# Patient Record
Sex: Female | Born: 1935 | Race: White | Hispanic: No | Marital: Married | Smoking: Former smoker
Health system: Southern US, Community
[De-identification: ages and names within clinical notes are randomized; demographics above are authoritative.]

## PROBLEM LIST (undated history)

## (undated) DIAGNOSIS — I5023 Acute on chronic systolic (congestive) heart failure: Secondary | ICD-10-CM

## (undated) DIAGNOSIS — Z9889 Other specified postprocedural states: Secondary | ICD-10-CM

## (undated) DIAGNOSIS — Z951 Presence of aortocoronary bypass graft: Secondary | ICD-10-CM

## (undated) DIAGNOSIS — Z860100 Personal history of colon polyps, unspecified: Secondary | ICD-10-CM

## (undated) DIAGNOSIS — I442 Atrioventricular block, complete: Secondary | ICD-10-CM

## (undated) DIAGNOSIS — Z952 Presence of prosthetic heart valve: Secondary | ICD-10-CM

## (undated) DIAGNOSIS — M1990 Unspecified osteoarthritis, unspecified site: Secondary | ICD-10-CM

## (undated) DIAGNOSIS — H8109 Meniere's disease, unspecified ear: Secondary | ICD-10-CM

## (undated) DIAGNOSIS — I2510 Atherosclerotic heart disease of native coronary artery without angina pectoris: Secondary | ICD-10-CM

## (undated) DIAGNOSIS — K219 Gastro-esophageal reflux disease without esophagitis: Secondary | ICD-10-CM

## (undated) DIAGNOSIS — E039 Hypothyroidism, unspecified: Secondary | ICD-10-CM

## (undated) DIAGNOSIS — T148XXA Other injury of unspecified body region, initial encounter: Secondary | ICD-10-CM

## (undated) DIAGNOSIS — Z9981 Dependence on supplemental oxygen: Secondary | ICD-10-CM

## (undated) DIAGNOSIS — I10 Essential (primary) hypertension: Secondary | ICD-10-CM

## (undated) DIAGNOSIS — R112 Nausea with vomiting, unspecified: Secondary | ICD-10-CM

## (undated) DIAGNOSIS — J189 Pneumonia, unspecified organism: Secondary | ICD-10-CM

## (undated) DIAGNOSIS — Z950 Presence of cardiac pacemaker: Secondary | ICD-10-CM

## (undated) DIAGNOSIS — F419 Anxiety disorder, unspecified: Secondary | ICD-10-CM

## (undated) DIAGNOSIS — J449 Chronic obstructive pulmonary disease, unspecified: Secondary | ICD-10-CM

## (undated) DIAGNOSIS — I9789 Other postprocedural complications and disorders of the circulatory system, not elsewhere classified: Secondary | ICD-10-CM

## (undated) DIAGNOSIS — I480 Paroxysmal atrial fibrillation: Secondary | ICD-10-CM

## (undated) DIAGNOSIS — Z923 Personal history of irradiation: Secondary | ICD-10-CM

## (undated) DIAGNOSIS — I272 Pulmonary hypertension, unspecified: Secondary | ICD-10-CM

## (undated) DIAGNOSIS — Z8679 Personal history of other diseases of the circulatory system: Secondary | ICD-10-CM

## (undated) DIAGNOSIS — I5042 Chronic combined systolic (congestive) and diastolic (congestive) heart failure: Secondary | ICD-10-CM

## (undated) DIAGNOSIS — L089 Local infection of the skin and subcutaneous tissue, unspecified: Secondary | ICD-10-CM

## (undated) DIAGNOSIS — E785 Hyperlipidemia, unspecified: Secondary | ICD-10-CM

## (undated) DIAGNOSIS — T8189XA Other complications of procedures, not elsewhere classified, initial encounter: Secondary | ICD-10-CM

## (undated) HISTORY — PX: VAGINAL HYSTERECTOMY: SUR661

## (undated) HISTORY — DX: Local infection of the skin and subcutaneous tissue, unspecified: L08.9

## (undated) HISTORY — DX: Hyperlipidemia, unspecified: E78.5

## (undated) HISTORY — DX: Chronic obstructive pulmonary disease, unspecified: J44.9

## (undated) HISTORY — DX: Paroxysmal atrial fibrillation: I48.0

## (undated) HISTORY — DX: Essential (primary) hypertension: I10

## (undated) HISTORY — PX: DILATION AND CURETTAGE OF UTERUS: SHX78

## (undated) HISTORY — DX: Hypothyroidism, unspecified: E03.9

## (undated) HISTORY — DX: Presence of prosthetic heart valve: Z95.2

## (undated) HISTORY — PX: EYE SURGERY: SHX253

## (undated) HISTORY — PX: INSERT / REPLACE / REMOVE PACEMAKER: SUR710

## (undated) HISTORY — PX: CORONARY ANGIOPLASTY: SHX604

## (undated) HISTORY — DX: Pulmonary hypertension, unspecified: I27.20

## (undated) HISTORY — PX: APPENDECTOMY: SHX54

## (undated) HISTORY — DX: Acute on chronic systolic (congestive) heart failure: I50.23

## (undated) HISTORY — PX: CARDIAC VALVE REPLACEMENT: SHX585

## (undated) HISTORY — DX: Chronic combined systolic (congestive) and diastolic (congestive) heart failure: I50.42

## (undated) HISTORY — DX: Other injury of unspecified body region, initial encounter: T14.8XXA

## (undated) HISTORY — PX: TONSILLECTOMY: SUR1361

## (undated) HISTORY — DX: Meniere's disease, unspecified ear: H81.09

## (undated) HISTORY — PX: COCHLEAR IMPLANT: SUR684

## (undated) HISTORY — DX: Atherosclerotic heart disease of native coronary artery without angina pectoris: I25.10

## (undated) SURGERY — Surgical Case
Anesthesia: *Unknown

---

## 2000-09-09 DIAGNOSIS — C50919 Malignant neoplasm of unspecified site of unspecified female breast: Secondary | ICD-10-CM

## 2000-09-09 HISTORY — PX: MASTECTOMY, PARTIAL: SHX709

## 2000-09-09 HISTORY — DX: Malignant neoplasm of unspecified site of unspecified female breast: C50.919

## 2004-10-08 ENCOUNTER — Ambulatory Visit: Payer: Self-pay | Admitting: Internal Medicine

## 2004-10-19 ENCOUNTER — Ambulatory Visit: Payer: Self-pay | Admitting: Oncology

## 2005-10-31 ENCOUNTER — Ambulatory Visit: Payer: Self-pay | Admitting: Internal Medicine

## 2005-11-13 ENCOUNTER — Ambulatory Visit: Payer: Self-pay | Admitting: Oncology

## 2006-02-04 ENCOUNTER — Ambulatory Visit: Payer: Self-pay | Admitting: Gastroenterology

## 2006-11-06 ENCOUNTER — Ambulatory Visit: Payer: Self-pay | Admitting: Internal Medicine

## 2006-11-07 ENCOUNTER — Ambulatory Visit: Payer: Self-pay | Admitting: Internal Medicine

## 2006-11-17 ENCOUNTER — Ambulatory Visit: Payer: Self-pay | Admitting: Surgery

## 2006-11-17 ENCOUNTER — Other Ambulatory Visit: Payer: Self-pay

## 2006-11-25 ENCOUNTER — Ambulatory Visit: Payer: Self-pay | Admitting: Surgery

## 2006-11-25 HISTORY — PX: BREAST BIOPSY: SHX20

## 2006-12-19 ENCOUNTER — Ambulatory Visit: Payer: Self-pay | Admitting: Obstetrics and Gynecology

## 2007-01-02 ENCOUNTER — Ambulatory Visit: Payer: Self-pay | Admitting: Obstetrics and Gynecology

## 2007-11-30 ENCOUNTER — Ambulatory Visit: Payer: Self-pay | Admitting: Family Medicine

## 2008-02-08 ENCOUNTER — Ambulatory Visit: Payer: Self-pay | Admitting: Gastroenterology

## 2008-03-23 ENCOUNTER — Ambulatory Visit: Payer: Self-pay

## 2008-06-14 ENCOUNTER — Ambulatory Visit: Payer: Self-pay | Admitting: Internal Medicine

## 2008-11-30 ENCOUNTER — Ambulatory Visit: Payer: Self-pay | Admitting: Internal Medicine

## 2009-12-04 ENCOUNTER — Ambulatory Visit: Payer: Self-pay | Admitting: Internal Medicine

## 2010-07-17 ENCOUNTER — Ambulatory Visit: Payer: Self-pay | Admitting: Gastroenterology

## 2010-07-20 LAB — PATHOLOGY REPORT

## 2010-12-11 ENCOUNTER — Ambulatory Visit: Payer: Self-pay | Admitting: Internal Medicine

## 2010-12-18 ENCOUNTER — Ambulatory Visit: Payer: Self-pay | Admitting: Specialist

## 2012-01-08 ENCOUNTER — Ambulatory Visit: Payer: Self-pay | Admitting: Internal Medicine

## 2012-01-09 ENCOUNTER — Ambulatory Visit: Payer: Self-pay | Admitting: Internal Medicine

## 2012-01-20 ENCOUNTER — Ambulatory Visit: Payer: Self-pay | Admitting: Surgery

## 2012-01-20 HISTORY — PX: BREAST BIOPSY: SHX20

## 2012-01-21 LAB — PATHOLOGY REPORT

## 2012-07-28 ENCOUNTER — Ambulatory Visit: Payer: Self-pay | Admitting: Surgery

## 2012-09-09 HISTORY — PX: CATARACT EXTRACTION W/ INTRAOCULAR LENS  IMPLANT, BILATERAL: SHX1307

## 2012-12-02 ENCOUNTER — Ambulatory Visit: Payer: Self-pay | Admitting: Ophthalmology

## 2013-01-06 ENCOUNTER — Ambulatory Visit: Payer: Self-pay | Admitting: Ophthalmology

## 2013-01-14 ENCOUNTER — Ambulatory Visit: Payer: Self-pay | Admitting: Internal Medicine

## 2013-09-20 ENCOUNTER — Ambulatory Visit: Payer: Self-pay | Admitting: Gastroenterology

## 2013-09-22 LAB — PATHOLOGY REPORT

## 2013-11-07 HISTORY — PX: CARDIAC CATHETERIZATION: SHX172

## 2013-11-16 ENCOUNTER — Ambulatory Visit: Payer: Self-pay | Admitting: Cardiology

## 2014-01-26 ENCOUNTER — Ambulatory Visit: Payer: Self-pay | Admitting: Internal Medicine

## 2014-03-28 DIAGNOSIS — J449 Chronic obstructive pulmonary disease, unspecified: Secondary | ICD-10-CM | POA: Insufficient documentation

## 2014-05-08 DIAGNOSIS — E039 Hypothyroidism, unspecified: Secondary | ICD-10-CM | POA: Insufficient documentation

## 2014-05-08 DIAGNOSIS — Z860101 Personal history of adenomatous and serrated colon polyps: Secondary | ICD-10-CM | POA: Insufficient documentation

## 2014-05-08 DIAGNOSIS — E119 Type 2 diabetes mellitus without complications: Secondary | ICD-10-CM | POA: Insufficient documentation

## 2014-05-08 DIAGNOSIS — H81319 Aural vertigo, unspecified ear: Secondary | ICD-10-CM | POA: Insufficient documentation

## 2014-05-08 DIAGNOSIS — Z86010 Personal history of colonic polyps: Secondary | ICD-10-CM | POA: Insufficient documentation

## 2014-05-08 DIAGNOSIS — E7800 Pure hypercholesterolemia, unspecified: Secondary | ICD-10-CM | POA: Insufficient documentation

## 2014-05-08 DIAGNOSIS — R7303 Prediabetes: Secondary | ICD-10-CM | POA: Insufficient documentation

## 2014-05-08 DIAGNOSIS — I10 Essential (primary) hypertension: Secondary | ICD-10-CM | POA: Insufficient documentation

## 2014-05-10 DIAGNOSIS — K219 Gastro-esophageal reflux disease without esophagitis: Secondary | ICD-10-CM | POA: Insufficient documentation

## 2014-10-10 HISTORY — PX: CARDIAC CATHETERIZATION: SHX172

## 2014-10-27 ENCOUNTER — Inpatient Hospital Stay: Payer: Self-pay | Admitting: Internal Medicine

## 2014-10-27 ENCOUNTER — Other Ambulatory Visit: Payer: Self-pay | Admitting: Physician Assistant

## 2014-10-27 DIAGNOSIS — I313 Pericardial effusion (noninflammatory): Secondary | ICD-10-CM

## 2014-10-27 DIAGNOSIS — I4891 Unspecified atrial fibrillation: Secondary | ICD-10-CM

## 2014-10-27 DIAGNOSIS — I5043 Acute on chronic combined systolic (congestive) and diastolic (congestive) heart failure: Secondary | ICD-10-CM

## 2014-10-28 ENCOUNTER — Encounter: Payer: Self-pay | Admitting: Cardiovascular Disease

## 2014-10-28 DIAGNOSIS — I2510 Atherosclerotic heart disease of native coronary artery without angina pectoris: Secondary | ICD-10-CM

## 2014-11-04 NOTE — Telephone Encounter (Signed)
This encounter was created in error - please disregard.

## 2014-11-09 ENCOUNTER — Encounter: Payer: Self-pay | Admitting: *Deleted

## 2014-11-10 ENCOUNTER — Encounter: Payer: Self-pay | Admitting: Cardiovascular Disease

## 2014-11-10 ENCOUNTER — Encounter: Payer: Self-pay | Admitting: *Deleted

## 2014-11-10 ENCOUNTER — Ambulatory Visit (INDEPENDENT_AMBULATORY_CARE_PROVIDER_SITE_OTHER): Payer: Medicare HMO | Admitting: Cardiovascular Disease

## 2014-11-10 VITALS — BP 122/66 | HR 86 | Ht 61.5 in | Wt 149.5 lb

## 2014-11-10 DIAGNOSIS — E785 Hyperlipidemia, unspecified: Secondary | ICD-10-CM

## 2014-11-10 DIAGNOSIS — I340 Nonrheumatic mitral (valve) insufficiency: Secondary | ICD-10-CM

## 2014-11-10 DIAGNOSIS — I059 Rheumatic mitral valve disease, unspecified: Secondary | ICD-10-CM | POA: Insufficient documentation

## 2014-11-10 DIAGNOSIS — I5022 Chronic systolic (congestive) heart failure: Secondary | ICD-10-CM

## 2014-11-10 DIAGNOSIS — I10 Essential (primary) hypertension: Secondary | ICD-10-CM

## 2014-11-10 DIAGNOSIS — I4891 Unspecified atrial fibrillation: Secondary | ICD-10-CM

## 2014-11-10 MED ORDER — APIXABAN 5 MG PO TABS: 5.0000 mg | ORAL_TABLET | Freq: Two times a day (BID) | ORAL | Status: DC

## 2014-11-10 MED ORDER — LOSARTAN POTASSIUM 50 MG PO TABS: 50.0000 mg | ORAL_TABLET | Freq: Every day | ORAL | Status: DC

## 2014-11-10 MED ORDER — SIMVASTATIN 20 MG PO TABS: 20.0000 mg | ORAL_TABLET | Freq: Every day | ORAL | Status: DC

## 2014-11-10 MED ORDER — POTASSIUM CHLORIDE CRYS ER 20 MEQ PO TBCR: 20.0000 meq | EXTENDED_RELEASE_TABLET | Freq: Every day | ORAL | Status: DC

## 2014-11-10 MED ORDER — DILTIAZEM HCL ER 120 MG PO CP24: 120.0000 mg | ORAL_CAPSULE | Freq: Every day | ORAL | Status: DC

## 2014-11-10 NOTE — Progress Notes (Signed)
Primary care physician: Dr. Ezequiel Kayser  HPI  This is a pleasant 80 year old female who is here today for a follow-up visit after recent hospitalization at [facility] for acute systolic heart failure and atrial fibrillation. She has known history of COPD, breast cancer status post right partial mastectomy, hypothyroidism, hyperlipidemia and Mnire disease. She was hospitalized last month at [facility] for worsening shortness of breath, palpitations and orthopnea. She was noted to be in atrial fibrillation with rapid ventricular response with heart rate in the 130 range. There was evidence of fluid overload with BNP of 2500. She underwent an echocardiogram which showed an ejection fraction of 35-40% with anteroseptal hypokinesis, moderately dilated left atrium, small pericardial effusion and moderate to severe mitral regurgitation. She was treated with rate control and diuresis with subsequent improvement. She underwent a right and left cardiac catheterization which showed moderately elevated filling pressures with giant V waves suggestive of significant mitral regurgitation. There was no evidence of obstructive coronary artery disease. She is feeling significantly better and overall lost 12 pounds since that hospitalization. Dyspnea improved significantly and she is not requiring oxygen as often.   Allergies  Allergen Reactions  . Augmentin [Amoxicillin-Pot Clavulanate]   . Nifedipine     Other reaction(s): Unknown  . Penicillins     Other reaction(s): Other (See Comments) Swollen joints  . Propranolol     Other reaction(s): Unknown  . Ace Inhibitors     Other reaction(s): Cough  . Diazepam     Other reaction(s): Other (See Comments) Made her "hyper"  . Meperidine     Other reaction(s): Other (See Comments) Made her "hyper"  . Propoxyphene     Other reaction(s): Other (See Comments) Made her "hyper"     Current Outpatient Prescriptions on File Prior to Visit  Medication Sig Dispense Refill  .  benzonatate (TESSALON) 100 MG capsule Take 100 mg by mouth 3 (three) times daily as needed for cough.    . bisoprolol (ZEBETA) 5 MG tablet Take 5 mg by mouth daily.    . fluticasone (FLONASE) 50 MCG/ACT nasal spray Place into both nostrils as needed.     . furosemide (LASIX) 20 MG tablet Take 20 mg by mouth 2 (two) times daily.     . Indacaterol Maleate 75 MCG CAPS Place into inhaler and inhale daily.    Marland Kitchen ipratropium-albuterol (DUONEB) 0.5-2.5 (3) MG/3ML SOLN Take 3 mLs by nebulization as needed.     Marland Kitchen levothyroxine (SYNTHROID, LEVOTHROID) 88 MCG tablet Take 88 mcg by mouth daily before breakfast.    . pantoprazole (PROTONIX) 40 MG tablet Take 40 mg by mouth daily.     No current facility-administered medications on file prior to visit.     Past Medical History  Diagnosis Date  . Hypertension   . COPD (chronic obstructive pulmonary disease)   . Hyperlipidemia   . Meniere disease   . HX: breast cancer   . Acute CHF (congestive heart failure)   . Atrial fibrillation   . Mitral regurgitation   . COPD (chronic obstructive pulmonary disease)   . Heart murmur     as child  . Cancer     hx breast cancer  . Hypothyroidism      Past Surgical History  Procedure Laterality Date  . Cochlear implant Bilateral   . Mastectomy, partial Right   . Cardiac catheterization  11/2013    [facility]  . Cardiac catheterization  10/2014    [facility]     Family History  Problem  Relation Age of Onset  . Heart disease Father   . Heart disease Brother   . Heart attack Paternal Uncle      History   Social History  . Marital Status: Married    Spouse Name: N/A  . Number of Children: N/A  . Years of Education: N/A   Occupational History  . Not on file.   Social History Main Topics  . Smoking status: Former Smoker -- 40 years    Types: Cigarettes  . Smokeless tobacco: Not on file  . Alcohol Use: Yes     Comment: OCCASIONAL  . Drug Use: No  . Sexual Activity: Not on file   Other Topics  Concern  . Not on file   Social History Narrative      PHYSICAL EXAM   BP 122/66 mmHg  Pulse 86  Ht 5' 1.5" (1.562 m)  Wt 149 lb 8 oz (67.813 kg)  BMI 27.79 kg/m2 Constitutional: She is oriented to person, place, and time. She appears well-developed and well-nourished. No distress.  HENT: No nasal discharge.  Head: Normocephalic and atraumatic.  Eyes: Pupils are equal and round. No discharge.  Neck: Normal range of motion. Neck supple. No JVD present. No thyromegaly present.  Cardiovascular: Normal rate, irregular rhythm, normal heart sounds. Exam reveals no gallop and no friction rub. No murmur heard.  Pulmonary/Chest: Effort normal and breath sounds normal. No stridor. No respiratory distress. She has no wheezes. She has no rales. She exhibits no tenderness.  Abdominal: Soft. Bowel sounds are normal. She exhibits no distension. There is no tenderness. There is no rebound and no guarding.  Musculoskeletal: Normal range of motion. She exhibits no edema and no tenderness.  Neurological: She is alert and oriented to person, place, and time. Coordination normal.  Skin: Skin is warm and dry. No rash noted. She is not diaphoretic. No erythema. No pallor.  Psychiatric: She has a normal mood and affect. Her behavior is normal. Judgment and thought content normal.     WEX:HBZJIR fibrillation  -Left axis -anterior fascicular block.   ABNORMAL    ASSESSMENT AND PLAN

## 2014-11-10 NOTE — Assessment & Plan Note (Signed)
Ejection fraction was 35-40% likely due to tachycardia-induced cardiomyopathy versus mitral regurgitation. She appears to be euvolemic on current dose of furosemide. Continue treatment with bisoprolol and losartan.

## 2014-11-10 NOTE — Assessment & Plan Note (Signed)
I recommend a transesophageal echocardiogram to further evaluate this. This will be done at the time of cardioversion next week.

## 2014-11-10 NOTE — Assessment & Plan Note (Signed)
The patient continues to be in atrial fibrillation with ventricular rate is well controlled on current medications. Continue anticoagulation with Eliquis. I recommend attempting cardioversion at least once. I discussed risks and benefits and this will be scheduled for next week.

## 2014-11-10 NOTE — Assessment & Plan Note (Signed)
Blood pressure is well controlled on current medications. 

## 2014-11-10 NOTE — Assessment & Plan Note (Signed)
I decreased the dose of simvastatin to 20 mg daily given the interaction with diltiazem.

## 2014-11-10 NOTE — Patient Instructions (Addendum)
Decrease Simvastatin to 20 mg once daily.  Continue other medications.   Your physician has requested that you have a TEE. During a TEE, sound waves are used to create images of your heart. It provides your doctor with information about the size and shape of your heart and how well your heart's chambers and valves are working. In this test, a transducer is attached to the end of a flexible tube that's guided down your throat and into your esophagus (the tube leading from you mouth to your stomach) to get a more detailed image of your heart. You are not awake for the procedure. Please see the instruction sheet given to you today. For further information please visit [email or web address].   Your physician has recommended that you have a Cardioversion (DCCV). Electrical Cardioversion uses a jolt of electricity to your heart either through paddles or wired patches attached to your chest. This is a controlled, usually prescheduled, procedure. Defibrillation is done under light anesthesia in the hospital, and you usually go home the day of the procedure. This is done to get your heart back into a normal rhythm. You are not awake for the procedure. Please see the instruction sheet given to you today.  Your physician recommends that you have labs today:  [name]  BMP  INR

## 2014-11-11 LAB — CBC WITH DIFFERENTIAL/PLATELET
BASOS ABS: 0 10*3/uL (ref 0.0–0.2)
Basos: 0 %
Eos: 3 %
Eosinophils Absolute: 0.2 10*3/uL (ref 0.0–0.4)
HEMATOCRIT: 37.9 % (ref 34.0–46.6)
Hemoglobin: 12.7 g/dL (ref 11.1–15.9)
IMMATURE GRANULOCYTES: 0 %
Immature Grans (Abs): 0 10*3/uL (ref 0.0–0.1)
Lymphocytes Absolute: 0.9 10*3/uL (ref 0.7–3.1)
Lymphs: 12 %
MCH: 32.3 pg (ref 26.6–33.0)
MCHC: 33.5 g/dL (ref 31.5–35.7)
MCV: 96 fL (ref 79–97)
MONOS ABS: 0.8 10*3/uL (ref 0.1–0.9)
Monocytes: 11 %
NEUTROS ABS: 5.3 10*3/uL (ref 1.4–7.0)
Neutrophils Relative %: 74 %
PLATELETS: 199 10*3/uL (ref 150–379)
RBC: 3.93 x10E6/uL (ref 3.77–5.28)
RDW: 14.6 % (ref 12.3–15.4)
WBC: 7.2 10*3/uL (ref 3.4–10.8)

## 2014-11-11 LAB — BASIC METABOLIC PANEL
BUN / CREAT RATIO: 15 (ref 11–26)
BUN: 10 mg/dL (ref 8–27)
CO2: 24 mmol/L (ref 18–29)
CREATININE: 0.68 mg/dL (ref 0.57–1.00)
Calcium: 9.5 mg/dL (ref 8.7–10.3)
Chloride: 102 mmol/L (ref 97–108)
GFR calc non Af Amer: 85 mL/min/{1.73_m2} (ref >59)
GFR, EST AFRICAN AMERICAN: 98 mL/min/{1.73_m2} (ref >59)
Glucose: 147 mg/dL — ABNORMAL HIGH (ref 65–99)
Potassium: 4.7 mmol/L (ref 3.5–5.2)
SODIUM: 144 mmol/L (ref 134–144)

## 2014-11-11 LAB — PROTIME-INR
INR: 1.1 (ref 0.8–1.2)
Prothrombin Time: 11.2 s (ref 9.1–12.0)

## 2014-11-14 ENCOUNTER — Telehealth: Payer: Self-pay

## 2014-11-14 NOTE — Telephone Encounter (Signed)
Pt husband calling asking why no one has called him back.   Stated to patient that we are seeing patient. And sometimes they need to  with the doctor and it may take time before they can call.  He understood and said someone needs to call them soon.

## 2014-11-14 NOTE — Telephone Encounter (Signed)
Pt spouse called, states pt has a procedure on Thurs. And asks if it is ok for her to have this procedure since she has a cochlear implant.please call and advise.

## 2014-11-14 NOTE — Telephone Encounter (Signed)
Patient's husband stated that he spoke with the cochlear implant company today regarding whether it was safe to do the TEE and Cardioversion since patient has a cochlear implant. Patient and her husband just wanted to check with office today to determine if Cardioversion would be "bipolar" or "monopolar", as the company explained to them that a "monopolar" cardioversion was not recommended. Dr. Meda Coffee verbalized that "bipolar" was the method used for Cardioversion. Will route to Dr. Fletcher Anon so that he knows of patient's concern.

## 2014-11-15 NOTE — Telephone Encounter (Signed)
Informed patient her Tee/Cardioversion is scheduled for 11/24/14 at 0730 Patient to arrive at 0630 am  Informed patient per Dr. Fletcher Anon that it is all right to have procedure with cochlear implant  Patient verbalized understanding

## 2014-11-23 ENCOUNTER — Telehealth: Payer: Self-pay | Admitting: *Deleted

## 2014-11-23 NOTE — Telephone Encounter (Signed)
Cath orders faxed  [facility] conformed receipt

## 2014-11-23 NOTE — Telephone Encounter (Signed)
Correction to previous phone note  Faxed tee cardioversion orders not cath  [facility] confirmed receipt

## 2014-11-24 ENCOUNTER — Ambulatory Visit: Payer: Self-pay | Admitting: Cardiovascular Disease

## 2014-11-24 ENCOUNTER — Other Ambulatory Visit: Payer: Self-pay

## 2014-11-24 ENCOUNTER — Encounter: Payer: Self-pay | Admitting: Cardiovascular Disease

## 2014-11-24 DIAGNOSIS — I4891 Unspecified atrial fibrillation: Secondary | ICD-10-CM | POA: Diagnosis not present

## 2014-11-24 DIAGNOSIS — I340 Nonrheumatic mitral (valve) insufficiency: Secondary | ICD-10-CM | POA: Diagnosis not present

## 2014-11-24 MED ORDER — POTASSIUM CHLORIDE CRYS ER 20 MEQ PO TBCR: 20.0000 meq | EXTENDED_RELEASE_TABLET | Freq: Every day | ORAL | Status: DC

## 2014-11-24 MED ORDER — DILTIAZEM HCL ER 120 MG PO CP24: 120.0000 mg | ORAL_CAPSULE | Freq: Every day | ORAL | Status: DC

## 2014-12-12 ENCOUNTER — Encounter: Payer: Self-pay | Admitting: Cardiovascular Disease

## 2014-12-12 ENCOUNTER — Ambulatory Visit (INDEPENDENT_AMBULATORY_CARE_PROVIDER_SITE_OTHER): Payer: Medicare PPO | Admitting: Cardiovascular Disease

## 2014-12-12 VITALS — BP 122/70 | HR 55 | Ht 61.5 in | Wt 150.2 lb

## 2014-12-12 DIAGNOSIS — I5021 Acute systolic (congestive) heart failure: Secondary | ICD-10-CM | POA: Diagnosis not present

## 2014-12-12 DIAGNOSIS — I4891 Unspecified atrial fibrillation: Secondary | ICD-10-CM

## 2014-12-12 DIAGNOSIS — I5022 Chronic systolic (congestive) heart failure: Secondary | ICD-10-CM | POA: Diagnosis not present

## 2014-12-12 DIAGNOSIS — I340 Nonrheumatic mitral (valve) insufficiency: Secondary | ICD-10-CM

## 2014-12-12 MED ORDER — SPIRONOLACTONE 25 MG PO TABS: 25.0000 mg | ORAL_TABLET | Freq: Every day | ORAL | Status: DC

## 2014-12-12 MED ORDER — BISOPROLOL FUMARATE 10 MG PO TABS: 10.0000 mg | ORAL_TABLET | Freq: Every day | ORAL | Status: DC

## 2014-12-12 NOTE — Patient Instructions (Signed)
Your physician has recommended you make the following change in your medication:  1) STOP diltiazem 2) STOP potassium  3) INCREASE bisoprolol to 10 mg one tablet by mouth once daily ( you may take two 5 mg tablets once daily until you use them up) 4) START spironolactone 25 mg one tablet by mouth once daily  Your physician recommends that you return for lab work in: 1 week- BMP  Your physician recommends that you schedule a follow-up appointment in: 1 month with Dr. Fletcher Anon.

## 2014-12-12 NOTE — Assessment & Plan Note (Signed)
She is maintaining in sinus rhythm after recent successful cardioversion. Continue long-term anticoagulation with Eliquis.

## 2014-12-12 NOTE — Progress Notes (Signed)
Primary care physician: Dr. Ezequiel Kayser  HPI  This is a pleasant 80 year old female who is here today for a follow-up visit regarding chronic systolic heart failure, atrial fibrillation and mitral regurgitation.  She has known history of COPD, breast cancer status post right partial mastectomy, hypothyroidism, hyperlipidemia and Mnire disease. She was hospitalized in February at [facility] for worsening shortness of breath, palpitations and orthopnea. She was noted to be in atrial fibrillation with rapid ventricular response with heart rate in the 130 range. There was evidence of fluid overload with BNP of 2500. She underwent an echocardiogram which showed an ejection fraction of 35-40% with anteroseptal hypokinesis, moderately dilated left atrium, small pericardial effusion and moderate to severe mitral regurgitation. She was treated with rate control and diuresis with subsequent improvement. She underwent a right and left cardiac catheterization which showed moderately elevated filling pressures with giant V waves suggestive of significant mitral regurgitation. There was no evidence of obstructive coronary artery disease.  I proceeded with TEE before cardioversion which showed no significant change in ejection fraction and valvular abnormalities. There was moderate tricuspid regurgitation with moderate pulmonary hypertension. The mitral valve appeared degenerative with no significant prolapse. Mitral regurgitation was moderate to severe. Although she is maintaining in sinus rhythm, she is actually reporting worsening dyspnea. No chest pain.   Allergies  Allergen Reactions  . Augmentin [Amoxicillin-Pot Clavulanate]   . Nifedipine     Other reaction(s): Unknown  . Penicillins     Other reaction(s): Other (See Comments) Swollen joints  . Propranolol     Other reaction(s): Unknown  . Ace Inhibitors     Other reaction(s): Cough  . Diazepam     Other reaction(s): Other (See Comments) Made her "hyper"   . Meperidine     Other reaction(s): Other (See Comments) Made her "hyper"  . Propoxyphene     Other reaction(s): Other (See Comments) Made her "hyper"     Current Outpatient Prescriptions on File Prior to Visit  Medication Sig Dispense Refill  . apixaban (ELIQUIS) 5 MG TABS tablet Take 1 tablet (5 mg total) by mouth 2 (two) times daily. 60 tablet 6  . benzonatate (TESSALON) 100 MG capsule Take 100 mg by mouth 3 (three) times daily as needed for cough.    . bisoprolol (ZEBETA) 5 MG tablet Take 5 mg by mouth daily.    Marland Kitchen diltiazem (DILACOR XR) 120 MG 24 hr capsule Take 1 capsule (120 mg total) by mouth daily. 30 capsule 6  . fluticasone (FLONASE) 50 MCG/ACT nasal spray Place into both nostrils as needed.     . furosemide (LASIX) 20 MG tablet Take 20 mg by mouth 2 (two) times daily.     . Indacaterol Maleate 75 MCG CAPS Place into inhaler and inhale daily.    Marland Kitchen ipratropium-albuterol (DUONEB) 0.5-2.5 (3) MG/3ML SOLN Take 3 mLs by nebulization as needed.     Marland Kitchen levothyroxine (SYNTHROID, LEVOTHROID) 88 MCG tablet Take 88 mcg by mouth daily before breakfast.    . losartan (COZAAR) 50 MG tablet Take 1 tablet (50 mg total) by mouth daily. 30 tablet 6  . pantoprazole (PROTONIX) 40 MG tablet Take 40 mg by mouth daily.    . potassium chloride SA (K-DUR,KLOR-CON) 20 MEQ tablet Take 1 tablet (20 mEq total) by mouth daily. 30 tablet 6   No current facility-administered medications on file prior to visit.     Past Medical History  Diagnosis Date  . Hypertension   . COPD (chronic obstructive pulmonary disease)   .  Hyperlipidemia   . Meniere disease   . HX: breast cancer   . Acute CHF (congestive heart failure)   . Atrial fibrillation   . Mitral regurgitation   . COPD (chronic obstructive pulmonary disease)   . Heart murmur     as child  . Cancer     hx breast cancer  . Hypothyroidism      Past Surgical History  Procedure Laterality Date  . Cochlear implant Bilateral   . Mastectomy,  partial Right   . Cardiac catheterization  11/2013    [facility]  . Cardiac catheterization  10/2014    [facility]     Family History  Problem Relation Age of Onset  . Heart disease Father   . Heart disease Brother   . Heart attack Paternal Uncle      History   Social History  . Marital Status: Married    Spouse Name: N/A  . Number of Children: N/A  . Years of Education: N/A   Occupational History  . Not on file.   Social History Main Topics  . Smoking status: Former Smoker -- 40 years    Types: Cigarettes  . Smokeless tobacco: Not on file  . Alcohol Use: Yes     Comment: OCCASIONAL  . Drug Use: No  . Sexual Activity: Not on file   Other Topics Concern  . Not on file   Social History Narrative      PHYSICAL EXAM   BP 122/70 mmHg  Pulse 55  Ht 5' 1.5" (1.562 m)  Wt 150 lb 4 oz (68.153 kg)  BMI 27.93 kg/m2 Constitutional: She is oriented to person, place, and time. She appears well-developed and well-nourished. No distress.  HENT: No nasal discharge.  Head: Normocephalic and atraumatic.  Eyes: Pupils are equal and round. No discharge.  Neck: Normal range of motion. Neck supple. No JVD present. No thyromegaly present.  Cardiovascular: Bradycardic, regular rhythm, normal heart sounds. Exam reveals no gallop and no friction rub. No murmur heard.  Pulmonary/Chest: Effort normal and breath sounds normal. No stridor. No respiratory distress. She has no wheezes. She has no rales. She exhibits no tenderness.  Abdominal: Soft. Bowel sounds are normal. She exhibits no distension. There is no tenderness. There is no rebound and no guarding.  Musculoskeletal: Normal range of motion. She exhibits no edema and no tenderness.  Neurological: She is alert and oriented to person, place, and time. Coordination normal.  Skin: Skin is warm and dry. No rash noted. She is not diaphoretic. No erythema. No pallor.  Psychiatric: She has a normal mood and affect. Her behavior is normal. Judgment  and thought content normal.     EKG: Sinus  Bradycardia  - occasional PAC    # PACs = 1. -Left axis -anterior fascicular block.   ABNORMAL    ASSESSMENT AND PLAN

## 2014-12-12 NOTE — Assessment & Plan Note (Signed)
This was at least moderate to severe most recent TEE. The plan is to repeat echocardiogram in about a month hopefully why she is maintaining sinus rhythm in order to reevaluate this. If there is no significant improvement in her symptoms, mitral regurgitation could be the etiology.

## 2014-12-12 NOTE — Assessment & Plan Note (Signed)
She appears to be euvolemic but is complaining of worsening dyspnea since recent cardioversion although she is maintaining in sinus rhythm. It is possible that she is heart rate dependent and she is mildly bradycardic today. Given that her ejection fraction is low, I stopped diltiazem and increased bisoprolol to 10 mg daily. Continue treatment with losartan. I added spironolactone 25 mg once daily and discontinued potassium. Check basic metabolic profile in one month. I will consider switching losartan to Entersto in the near future.

## 2014-12-19 ENCOUNTER — Other Ambulatory Visit (INDEPENDENT_AMBULATORY_CARE_PROVIDER_SITE_OTHER): Payer: Medicare PPO | Admitting: *Deleted

## 2014-12-19 DIAGNOSIS — I4891 Unspecified atrial fibrillation: Secondary | ICD-10-CM

## 2014-12-19 DIAGNOSIS — I5021 Acute systolic (congestive) heart failure: Secondary | ICD-10-CM

## 2014-12-20 LAB — BASIC METABOLIC PANEL
BUN/Creatinine Ratio: 16 (ref 11–26)
BUN: 12 mg/dL (ref 8–27)
CO2: 27 mmol/L (ref 18–29)
CREATININE: 0.75 mg/dL (ref 0.57–1.00)
Calcium: 9.4 mg/dL (ref 8.7–10.3)
Chloride: 100 mmol/L (ref 97–108)
GFR calc Af Amer: 89 mL/min/{1.73_m2} (ref >59)
GFR, EST NON AFRICAN AMERICAN: 77 mL/min/{1.73_m2} (ref >59)
Glucose: 136 mg/dL — ABNORMAL HIGH (ref 65–99)
Potassium: 4.8 mmol/L (ref 3.5–5.2)
SODIUM: 141 mmol/L (ref 134–144)

## 2015-01-08 NOTE — Discharge Summary (Signed)
PATIENT NAME:  [name], [name] MR#:  [id number] DATE OF BIRTH:  Feb 10, 1935  DATE OF ADMISSION:  10/27/2014 DATE OF DISCHARGE:  10/30/2014  PRESENTING COMPLAINT: Shortness of breath.   DISCHARGE DIAGNOSES: 1.  Acute systolic congestive heart failure.  2.  New-onset atrial fibrillation.  3.  Moderate to severe mitral regurgitation on cardiac catheterization.  4.  Chronic obstructive pulmonary disease flare.  5.  Hypertension.   CONDITION ON DISCHARGE: Fair. Vital stable.   PROCEDURES: Cardiac catheterization done on feb 19th showed no angiographic evidence of occlusive CAD. Calcified coronaries without obstructive disease. Global left ventricular systolic function was moderately depressed. EF 30% to 40%. Mitral valve exhibited moderate to severe regurgitation.   CODE STATUS: FULL.  DISCHARGE MEDICATIONS: 1.  Tessalon Perles 100 mg t.i.d.  2.  Levoxyl 88 mcg p.o. daily.  3.  Protonix 40 mg daily.  4.  Flonase 50 mcg nasal spray daily.  5.  Simvastatin 40 mg daily at bedtime.  6.  Arcapta Neohaler 75 mcg 1 capsule inhalation daily.  7.  Bisoprolol 5 mg daily.  8.  Lasix 20 mg b.i.d.  9.  Prednisone taper.  10.  Eliquis 5 mg b.i.d.  11.  DuoNebs as before.  12.  Diltiazem 120 mg extended release 1 capsule p.o. daily.  13.  Potassium chloride 20 mEq p.o. daily.  14.  Cozaar 50 mg daily.   HOME OXYGEN:  2 liters nasal cannula oxygen.   DISCHARGE INSTRUCTIONS AND FOLLOWUP:  1.  Follow up with Dr. Fletcher Anon in 1 to 2 weeks.  2.  Follow up with Dr. Raul Del in 2 to 4 weeks.  3.  The patient advised to resume her home oxygen use as before.   CONSULTATIONS: Cardiology, Dr. Fletcher Anon.   BRIEF SUMMARY OF HOSPITAL COURSE: [name] is a pleasant 80 year old Caucasian female with history of COPD on home oxygen, obstructive sleep apnea on CPAP, high blood pressure and hypothyroidism admitted with:  1.  Acute systolic congestive heart failure. Echo showed EF of 35% to 40% with apical wall  hypokinesis. She underwent cardiac cath. Results as above were noted. She was also found to have moderate to severe MR. Lungs were clear on exam. She was changed to p.o. Lasix and continued on her beta blockers and losartan.  2.  New-onset A-fib, rate controlled now on Cardizem and bisoprolol. She was started on Eliquis by gastroenterology. Plan is for cardioversion outpatient after 3 weeks of anticoagulation. 3.  COPD. Continued home oxygen. Received a tapering dose of steroids with her COPD flare along with home inhalers.  4.  Hypothyroidism. Continue Synthroid.  5.  Hypertension. Continue home meds.   Hospital stay otherwise remained stable. The patient remained a FULL code.   TIME SPENT: 40 minutes.  ____________________________ Hart Rochester Posey Pronto, MD sap:sb D: 10/31/2014 14:34:26 ET T: 10/31/2014 15:26:26 ET JOB#: [id number]  cc: Georgie Eduardo A. Posey Pronto, MD, <Dictator> Ilda Basset MD ELECTRONICALLY SIGNED 11/08/2014 17:32

## 2015-01-08 NOTE — Consult Note (Signed)
General Aspect Primary Cardiologist: New to [facility] _________________  80 year old female with history of pulmonary HTN, COPD on home O2, impaired hearing status post bilateral cochlear implants, history of breast cancer status post right partial mastectomy, hypothyroidism, hyperlipidemia, and Meniere disease status post bilateral cochlear implants who presented to [facility] ED on 2/18 with 3 day history of increased SOB and 1 day history of sudden onset of palpitations.  _________________  PMH: 1. Pulmonary HTN 2. COPD on home O2 3. Impaired hearing status post bilateral cochlear implants 4. History of breast cancer status post right partial mastectomy 5. Hypothyroidism 6. HLD 7. Meniere disease status post bilateral cochlear implants __________________   Present Illness 80 year old female with the above problem list who presented to [facility] on 2/18 with 3 day history of increased SOB and 1 day history of sudden onset of palpitations. She was previously seen by [facility] Card[facility] x 1 for increased SOB and underwent RHC only (no LHC) in March 2015. RHC showed pressure of 34 mm Hg. She was advised to started sildenafil for pulmonary HTN by her cardiologist. By her pulmonologist she was advised to start a medication that reportedly cost >$2,000 monthly. She decided to start no medication. She has not followed up with cardiology since that time. She has persistent SOB on home O2. She has been trying to lose weight through dieting with her husband. She has been unable to exercise 2/2 SOB and fatigue.   Over the past 3 days her SOB has gotten considerably worse. She also noted a 4 pound weight gain over 2 days with associated lowere extremity edema and abdominal swelling. She has also noted some orthopnea over the past 24 hours to the point of not being able to lay down at all. No chest pain. Her husband reports she had to be feeling quite bad to call EMS. No dizziness. No loss of consciousness. She does note  palpitations. She does admit to some palpitations on and off. No history of any recent fever or cough. No history of any nausea, vomiting, diarrhea, abdominal pain, or dysuria.  Upon her arrival to [facility] she was noted to be in new onset a-fib with RVR with HR in the 130s. She received IV Cardizem x 2 in the Emergency Department, following which her heart rate improved to the mid 90s to low 100 beats per minute. The patient was also noted to have elevated BNP of 2484, and chest x-ray was consistent with bilateral pulmonary congestion. The patient was given supplemental oxygen, and following rate control of the heart, her symptoms started improving. She was started on Lovenox. Echo showed EF 35-40%, severe anteroseptal wall HK, diastolic dysfunction, moderately dilated LA at 4.90 cm, moderately dilated RA, small pericardial effusion, moderate to severe MR, PASP at 47.8 mm Hg. HR ranging in to 1-teens on tele this morning.   Physical Exam:  GEN no acute distress   HEENT hearing intact to voice   NECK supple   RESP normal resp effort  clear BS   CARD Irregular rate and rhythm  Murmur   Murmur Systolic   ABD denies tenderness  soft   EXTR negative edema   SKIN normal to palpation   NEURO cranial nerves intact   PSYCH alert   Review of Systems:  General: Fatigue   Skin: No Complaints   ENT: No Complaints   Eyes: No Complaints   Neck: No Complaints   Respiratory: Short of breath   Cardiovascular: Palpitations  Dyspnea  Orthopnea   Gastrointestinal: Nausea   Genitourinary: No Complaints   Vascular: No Complaints   Musculoskeletal: No Complaints   Neurologic: No Complaints   Hematologic: No Complaints   Endocrine: No Complaints   Psychiatric: No Complaints   Review of Systems: All other systems were reviewed and found to be negative   Medications/Allergies Reviewed Medications/Allergies reviewed   Family & Social History:  Family and Social History:  Family  History Hypertension   Social History negative ETOH, negative Illicit drugs   + Tobacco Prior (greater than 1 year)   Place of Living Home   Home Medications: Medication Instructions Status  felodipine extended release 10 mg oral tablet, extended release 1 tab(s) orally once a day Active  Levoxyl 88 mcg (0.088 mg) oral tablet 1 tab(s) orally once a day Active  Protonix 40 mg oral delayed release tablet 1 tab(s) orally once a day Active  hydrochlorothiazide-losartan 25 mg-100 mg oral tablet 1 tab(s) orally once a day Active  Flonase 50 mcg/inh nasal spray 1 spray(s) nasal once a day Active  simvastatin 40 mg oral tablet 1 tab(s) orally once a day (at bedtime) Active  aspirin 81 mg oral delayed release tablet 1 tab(s) orally once a day Active  Arcapta Neohaler 75 mcg inhalation capsule 1 each inhaled once a day Active  bisoprolol 5 mg oral tablet 1 tab(s) orally once a day Active  furosemide 20 mg oral tablet 1 tab(s) orally once a day Active  benzonatate 100 mg oral capsule 1 cap(s) orally 3 times a day Active   Lab Results:  Routine Chem:  18-Feb-16 12:11   Glucose, Serum  161  BUN  23  Creatinine (comp) 0.85  Sodium, Serum 139  Potassium, Serum 3.6  Chloride, Serum 102  CO2, Serum 29  Calcium (Total), Serum 9.3  Anion Gap 8  Osmolality (calc) 285  eGFR (African American) >60  eGFR (Non-African American) >60 (eGFR values <76m/min/1.73 m2 may be an indication of chronic kidney disease (CKD). Calculated eGFR, using the MRDR Study equation, is useful in  patients with stable renal function. The eGFR calculation will not be reliable in acutely ill patients when serum creatinine is changing rapidly. It is not useful in patients on dialysis. The eGFR calculation may not be applicable to patients at the low and high extremes of body sizes, pregnant women, and vegetarians.)  B-Type Natriuretic Peptide ([facility]  2484 (Result(s) reported on 27 Oct 2014 at 12:50AM.)  Cardiac:   18-Feb-16 04:30   Troponin I < 0.02 (0.00-0.05 0.05 ng/mL or less: NEGATIVE  Repeat testing in 3-6 hrs  if clinically indicated. >0.05 ng/mL: POTENTIAL  MYOCARDIAL INJURY. Repeat  testing in 3-6 hrs if  clinically indicated. NOTE: An increase or decrease  of 30% or more on serial  testing suggests a  clinically important change)    08:07   Troponin I < 0.02 (0.00-0.05 0.05 ng/mL or less: NEGATIVE  Repeat testing in 3-6 hrs  if clinically indicated. >0.05 ng/mL: POTENTIAL  MYOCARDIAL INJURY. Repeat  testing in 3-6 hrs if  clinically indicated. NOTE: An increase or decrease  of 30% or more on serial  testing suggests a  clinically important change)    12:11   Troponin I < 0.02 (0.00-0.05 0.05 ng/mL or less: NEGATIVE  Repeat testing in 3-6 hrs  if clinically indicated. >0.05 ng/mL: POTENTIAL  MYOCARDIAL INJURY. Repeat  testing in 3-6 hrs if  clinically indicated. NOTE: An increase or decrease  of 30% or more on serial  testing suggests a  clinically important change)  Routine Coag:  18-Feb-16 12:11   Prothrombin 15.0 (11.4-15.0 NOTE: New Reference Range  10/07/14)  INR 1.2 (INR reference interval applies to patients on anticoagulant therapy. A single INR therapeutic range for coumarins is not optimal for all indications; however, the suggested range for most indications is 2.0 - 3.0. Exceptions to the INR Reference Range may include: Prosthetic heart valves, acute myocardial infarction, prevention of myocardial infarction, and combinations of aspirin and anticoagulant. The need for a higher or lower target INR must be assessed individually. Reference: The Pharmacology and Management of the Vitamin K  antagonists: the seventh ACCP Conference on Antithrombotic and Thrombolytic Therapy. OEVOJ.5009 Sept:126 (3suppl): [id number]. A HCT value >55% may artifactually increase the PT.  In one study,  the increase was an average of 25%. Reference:  "Effect on Routine and  Special Coagulation Testing Values of Citrate Anticoagulant Adjustment in Patients with High HCT Values." American Journal of Clinical Pathology 2006;126:400-405.)  Routine Hem:  18-Feb-16 12:11   WBC (CBC) 9.0  RBC (CBC)  3.73  Hemoglobin (CBC) 12.2  Hematocrit (CBC) 36.8  Platelet Count (CBC) 181 (Result(s) reported on 27 Oct 2014 at 12:47AM.)  MCV 99  MCH 32.7  MCHC 33.2  RDW  14.8   EKG:  EKG Interp. by me   Interpretation a-fib, 98 bpm, no significant st/t changes   Radiology Results: XRay:    18-Feb-16 00:15, Chest Portable Single View  Chest Portable Single View   REASON FOR EXAM:    SOB  COMMENTS:       PROCEDURE: DXR - DXR PORTABLE CHEST SINGLE VIEW  - Oct 27 2014 12:15AM     CLINICAL DATA:  Increasing dyspnea over the past 3 days.    EXAM:  PORTABLE CHEST - 1 VIEW    COMPARISON:  CT 12/18/2010    FINDINGS:  There is moderate cardiomegaly. Calcified granulomata are present.  Linear basilar opacities are present, atelectasis versus scarring.  There may be small pleural effusions, with slight blunting of the  lateral costophrenic angles of indeterminate chronicity. There is  ground-glass right upper lobe opacity which may represent alveolar  infiltrate or edema. Mild vascular and interstitial prominence is  present.     IMPRESSION:  The vascular and interstitial changes are suggestive of congestive  heart failure. Small pleural effusions are probably present and  there may be early alveolar infiltrate/edema in the right upper  lobe.      Electronically Signed    By: Andreas Newport M.D.    On: 10/27/2014 00:24         Verified By: Andreas Newport, M.D.,  Cardiology:    18-Feb-16 09:20, Echo Doppler  Echo Doppler   REASON FOR EXAM:      COMMENTS:       PROCEDURE: [facility] - ECHO DOPPLER COMPLETE(TRANSTHOR)  - Oct 27 2014  9:20AM     RESULT: Echocardiogram Report    Patient Name:   RAYHANA SLIDER Date of Exam: 10/27/2014  Medical Rec #:   [id number]        Custom1:  Date ofBirth:  05-19-1935      Height:       61.8 in  Patient Age:    84 years      Weight:       156.5 lb  Patient Gender: F             BSA:  1.51 m??    Indications: CHF  Sonographer:    Sherrie Sport RDCS  Referring Phys: Azucena Freed, N    Summary:   1. Left ventricular ejection fraction, by visual estimation, is 35 to   40%.   2. Moderately decreased global left ventricular systolic function.   3. Severe anteroseptal hypokinesis.   4. Mild concentric left ventricular hypertrophy.   5. Restrictive pattern of LV diastolic filling.   6. Moderately dilated left atrium.   7. Moderately dilated right atrium.   8. Small pericardial effusion, as described above.   9. Mild thickening and calcification of the anterior and posterior   mitral valve leaflets.  10. Moderate to severe mitral valve regurgitation.  11. Mild to moderate aortic valve sclerosis/calcification without any   evidence of aortic stenosis.  12. Moderate to severe tricuspid regurgitation.  13. Mildly elevated pulmonary artery systolic pressure.  2D AND M-MODE MEASUREMENTS (normal ranges within parentheses):  Left Ventricle:          Normal  IVSd (2D):      1.20 cm (0.7-1.1)  LVPWd (2D):     1.07 cm (0.7-1.1) Aorta/LA:                  Normal  LVIDd (2D):     5.06 cm (3.4-5.7) Aortic Root (2D): 2.80 cm (2.4-3.7)  LVIDs (2D):     4.40 cm           Left Atrium (2D): 4.90 cm (1.9-4.0)  LV FS (2D):     13.0 %   (>25%)  LV EF (2D):     27.9 %   (>50%)                                    Right Ventricle:             RVd (2D):        5.28 cm  LV DIASTOLIC FUNCTION:  MV Peak E: 1.32 m/s E/e' Ratio: 16.30                      Decel Time: 216 msec  SPECTRAL DOPPLER ANALYSIS (where applicable):  Mitral Valve:  MV P1/2 Time: 62.64 msec  MV Area, PHT: 3.51 cm??  Aortic Valve: AoV Max Vel: 1.26 m/s AoV Peak PG: 6.4 mmHg AoV Mean PG:   3.0 mmHg  LVOT Vmax: 0.65 m/s LVOT VTI: 0.167 m LVOT  Diameter: 2.00 cm  AoV Area, Vmax: 1.62 cm?? AoV Area, VTI: 2.30 cm?? AoV Area, Vmn: 1.96 cm??  Tricuspid Valve and PA/RV Systolic Pressure: TR Max Velocity: 3.27 m/s RA   Pressure: 5 mmHg RVSP/PASP: 47.8 mmHg  Pulmonic Valve:  PV Max Velocity: 1.10 m/s PV Max PG: 4.9 mmHg PV Mean PG:  PHYSICIAN INTERPRETATION:  Left Ventricle: The left ventricular internal cavity size wasnormal.   Mild concentric left ventricular hypertrophy. Global LV systolic function   was moderately decreased. Left ventricular ejection fraction, by visual   estimation, is 35 to 40%. Spectral Doppler shows restrictive pattern of   LV diastolic filling.  Right Ventricle: Normal right ventricular size, wall thickness, and   systolic function.  Left Atrium: The left atrium is moderately dilated.  Right Atrium: The right atrium is moderately dilated.  Pericardium: A small pericardial effusion is present.  Mitral Valve: There is mild thickening and calcification of the anterior   and posterior mitral valve  leaflets. Moderate to severe mitral valve   regurgitation is seen. The MR jet is posteriorly-directed.  Tricuspid Valve: The tricuspid valveis normal. Moderate to severe     tricuspid regurgitation is visualized. The tricuspid regurgitant velocity   is 3.27 m/s, and with an assumed right atrial pressure of 5 mmHg, the   estimated right ventricular systolic pressure is mildly elevated at 47.8   mmHg.  Aortic Valve: The aortic valve was not well seen. Mild to moderate aortic   valve sclerosis/calcification is present, without any evidence of aortic   stenosis.  Pulmonic Valve: The pulmonic valve is not well seen.  Aorta: The aortic root is normal in size and structure.  Venous: The inferior vena cava was normal sized.    [id number] Kathlyn Sacramento MD  Electronically signed by [id number] Kathlyn Sacramento MD  Signature Date/Time: 10/27/2014/12:28:41 PM  *** Final ***    IMPRESSION: .        Verified By: Mertie Clause. ARIDA,  M.D., MD    Penicillin: Swelling  Augmentin: Other  Vital Signs/Nurse's Notes: **Vital Signs.:   18-Feb-16 12:16  Vital Signs Type Routine  Temperature Temperature (F) 97.3  Celsius 36.2  Temperature Source oral  Pulse Pulse 111  Respirations Respirations 20  Systolic BP Systolic BP 518  Diastolic BP (mmHg) Diastolic BP (mmHg) 74  Mean BP 88  Pulse Ox % Pulse Ox % 95  Pulse Ox Activity Level  At rest  Oxygen Delivery 3L    Impression 80 year old female with history of pulmonary HTN, COPD on home O2, impaired hearing status post bilateral cochlear implants, history of breast cancer status post right partial mastectomy, hypothyroidism, hyperlipidemia, and Meniere disease status post bilateral cochlear implants who presented to [facility] ED on 2/18 with 3 day history of increased SOB and 1 day history of sudden onset of palpitations.   1. New onset a-fib with RVR: -Remains with HR in the 1-teens -Increased bisoprolol to 10 mg daily -Diltiazem 30 mg q 6 hours -CHADSVASc at least 5 giving her an estimated annual stroke risk of 6.7% -Start heparin gtt given planned cardiac cath then would follow with NOAC  2. Acute on chronic combined systolic and diastolic CHF: -Bisoprolol as above -Losartan (angioedema with ACEi) -IV Lasix 40 mg bid -Plan for cardiac cath  3. COPD on home O2: -Inhalers per IM  4. HLD: -On statin  5. HTN: -Stable  6. Hypothyroidism   Electronic Signatures for Addendum Section:  Kathlyn Sacramento (MD) (Signed Addendum 18-Feb-16 14:04)  The patient was seen and examined. AGree with the above. She has known history of COPD. She presented with severe dyspnea and orthopnea and was found to be in A-fib with RVr (new) and heart failure. She feels better with diuresis and rate control. Lungs with few crackles at the base. Still mildly tachycardiac. Echo showed an EF of 35-40% with anteroseptal hypokinesis , moderate to severe MR and TR.  Recommend:  Continue IV Lasix.   Continue rate control.  Hold Eliquis and start Heparin in anticepation of cardiac cath.  Right and left cardiac cath tomorrow if she is not significantly orthopnic. Risks and benefits were explained.   Electronic Signatures: Kathlyn Sacramento (MD)  (Signed 18-Feb-16 14:04)  Co-Signer: General Aspect/Present Illness, History and Physical Exam, Review of System, Family & Social History, Home Medications, Labs, EKG , Radiology, Allergies, Vital Signs/Nurse's Notes, Impression/Plan Dunn, Ryan M (PA-C)  (Signed 18-Feb-16 12:58)  Authored: General Aspect/Present Illness, History and Physical Exam, Review of System,  Family & Social History, Home Medications, Labs, EKG , Radiology, Allergies, Vital Signs/Nurse's Notes, Impression/Plan   Last Updated: 18-Feb-16 14:04 by Kathlyn Sacramento (MD)

## 2015-01-08 NOTE — H&P (Signed)
PATIENT NAME:  [name], [name] MR#:  [id number] DATE OF BIRTH:  May 17, 1935  DATE OF ADMISSION:  10/27/2014  REFERRING PHYSICIAN:  Loney Hering, MD  PRIMARY CARE PHYSICIAN:  Christena Flake. Raechel Ache, MD  PRIMARY CARDIOLOGIST:  Isaias Cowman, MD   PRIMARY PULMONOLOGIST:  Freda Munro Raul Del, MD  ADMITTING PHYSICIAN:  Juluis Mire, MD    CHIEF COMPLAINT:  Exertional shortness of breath for the past 2 to 3 days.    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 The patient states that she has COPD and for which she was prescribed oxygen in December, following which her symptoms of shortness of breath improved. About 3 days ago, she started having increasing exertional shortness of breath, which gradually worsened, hence came to the Emergency Room for further evaluation. Denies any history of chest pain. No dizziness. No loss of consciousness. She does admit to some palpitations on and off. No history of any recent fever or cough. No history of any nausea, vomiting, diarrhea, abdominal pain, or dysuria.   In the Emergency Room, the patient was evaluated by the ED physician and was found on arrival to have atrial fibrillation with a rapid ventricular rate of 134 beats per minute, which is a new onset. The patient was given IV Cardizem x 2 in the Emergency Department, following which her heart rate improved, and currently her heart rate is stable around 95 to 100 beats per minute. The patient was also noted to have elevated BNP of 2484, and chest x-ray was consistent with  bilateral pulmonary congestion. The patient was given supplemental oxygen, and following rate control of the heart, her symptoms started improving. At the current time, the patient is comfortably resting in the bed. Denies any symptoms such as chest pain, shortness of breath, dizziness, palpitations, or loss of consciousness.   PAST MEDICAL HISTORY: 1.  Hypertension.  2.  COPD on home oxygen 2 liters per minute since December 2015.  3.  Hypothyroidism.  4.  Hyperlipidemia.  5.  Meniere disease status post bilateral cochlear implants.  6.  History of breast cancer status post right partial mastectomy.   PAST SURGICAL HISTORY: 1.  Cochlear implants bilaterally.  2.  Partial mastectomy, right side.  3.  Status post cardiac catheterization in March 2015 and according to the patient had clean coronaries but increased right ventricular pressures.   ALLERGIES:  1.  PENICILLIN.  2.  AUGMENTIN.   FAMILY HISTORY:  Coronary artery disease with father and brother, history of CVA, COPD, born cancers, and dementia in multiple family members.   SOCIAL HISTORY:  She is married and lives with her husband at home. She is a retired [employer] account executive. Ex-smoker, quit about 17 years ago. Occasional wine intake. Denies any substance abuse.    HOME MEDICATIONS: 1.  Arcapta Neohaler 75 mcg inhalation capsule 1 capsule inhaled once a day.  2.  Aspirin 81 mg 1 tablet orally once a day.  3.  Benzonatate 100 mg oral capsule 1 capsule 3 times a day.  4.  Bisoprolol 5 mg oral tablet 1 tablet orally once a day.  5.  Felodipine  extended-release 10 mg oral tablet 1 tablet orally once a day.  6.  Flonase nasal spray 1 spray in each nostril once a day.  7.  Furosemide 20 mg 1 tablet orally once a day.  8.  Hydrochlorothiazide/losartan 25/100 mg tablet 1 tablet orally once a day.  9.  Levoxyl 88 mcg 1 tablet orally once a day.  10.  Protonix 40 mg 1 tablet orally once a day.  11.  Simvastatin 40 mg 1 tablet orally once a  day.   REVIEW OF SYSTEMS: CONSTITUTIONAL:  Negative for fever or chills. No fatigue. No generalized weakness.  EYES:  Negative for blurred vision or double vision. No pain. No redness. No discharge.  EARS, NOSE, AND THROAT:  Negative for tinnitus or ear pain. History of chronic hearing loss. Negative for any epistaxis, nasal discharge, or difficulty swallowing.  RESPIRATORY:  Negative for cough, wheezing, dyspnea, hemoptysis, or painful respiration.  CARDIOVASCULAR:  Positive for shortness of breath on exertion. Negative for chest pain. Occasional palpitations present. No dizziness. No loss of consciousness. No pedal edema.  GASTROINTESTINAL:  Negative for nausea, vomiting, diarrhea, abdominal pain, hematemesis, melena, or GERD symptoms.  GENITOURINARY:  Negative for dysuria, frequency, urgency, or hematuria.  ENDOCRINE:  Negative for polyuria, nocturia, or heat or cold intolerance.  HEMATOLOGIC AND LYMPHATIC:  Negative for anemia, easy bruising or bleeding, or swollen glands.  INTEGUMENTARY:  Negative for acne, skin rash, or lesions.  MUSCULOSKELETAL:  Negative for arthritis, gout,  neck pain, or back pain.  NEUROLOGICAL:  Negative for focal weakness or numbness. No history of CVA or TIA.  PSYCHIATRIC:  Negative for anxiety, insomnia, or depression.   PHYSICAL EXAMINATION: VITAL SIGNS:  Temperature 97.7 degrees Fahrenheit, pulse rate on arrival 148 beats per minute, current pulse rate 92 beats per minute, respirations 21 per minute on arrival and currently 16 per minute, blood pressure on arrival 146/68 and current blood pressure 123/80, oxygen saturation 95% on 3 liters.   GENERAL:  Well-developed, well-nourished alert and in no acute distress, comfortably resting in the bed at this time.  HEAD:  Atraumatic, normocephalic.  EYES:  Pupils are equal and react to light. No conjunctival pallor. No icterus. Extraocular muscles are intact.  NOSE:  No drainage. No lesions.  EARS:  No drainage. No  external lesions.  ORAL CAVITY:  No mucosal lesions. No exudates.  NECK:  Supple. No JVD. No thyromegaly. No carotid bruit. Range of motion of the neck is within normal limits.  RESPIRATORY:  Good respiratory effort. Not using accessory muscles of respiration. Bilateral air entry present. Bilateral rales at the bases present. No rhonchi.  CARDIOVASCULAR:  S1 and S2, irregularly irregular. No murmurs, gallops, or clicks. Peripheral pulses are equal at carotid, femoral, and pedal pulses. No peripheral edema.  GASTROINTESTINAL:  Abdomen is soft and nontender. No hepatosplenomegaly. No masses. No rigidity. No guarding. Bowel sounds present and equal in all 4 quadrants.  GENITOURINARY:  Deferred.  MUSCULOSKELETAL:  Gait is normal. No joint tenderness or effusions. Range of motion is adequate.  SKIN:  Inspection within normal limits. No obvious wounds.  LYMPHATIC:  No cervical lymphadenopathy.  VASCULAR:  Good dorsalis pedis and posterior tibial pulses.  NEUROLOGICAL:  Alert, awake, and oriented x 3. Cranial nerves II through XII are grossly intact. No sensory deficit. Motor strength is 5/5 in both upper and lower extremities. DTRs are 2+ bilaterally and symmetrical. Plantars are downgoing.  PSYCHIATRIC:  Alert, awake, and oriented x 3. Judgment and insight are adequate.  Memory and mood are within normal limits.   LABORATORY DATA:  Serum glucose 161, BNP 2484, BUN 23, creatinine 0.85, sodium 139, potassium 3.6, chloride 102, bicarbonate 29, serum calcium 9.3. Troponin is less than 0.02. WBC is 9.0, hemoglobin 12.2, hematocrit 36.8, and platelet count 181,000. Prothrombin time is 15.0, INR 1.2.   CHEST X-RAY:  Moderate cardiomegaly, vascular interstitial changes suggestive of congestive heart failure. Small pleural effusions are probably present.   EKG:  Atrial fibrillation with ventricular rate of 134 beats per minute. No acute ST-T changes.   ASSESSMENT AND PLAN:  This is a 80 year old Caucasian  female with a history of hypertension, chronic obstructive pulmonary disease on home oxygen, hypothyroidism, hyperlipidemia, Meniere disease status post bilateral cochlear implants, and history of breast cancer status post right partial mastectomy, who presents with the complaints of exertional shortness of breath of 2 to 3 days' duration and found to have new-onset atrial fibrillation with rapid ventricular rate of 134 beats per minute. On arrival, she is also noted to have congestive heart failure, which is a new onset.   1.  Atrial fibrillation, new onset. She received 2 doses of intravenous Cardizem in the Emergency Department, following which her heart rate is under control. Plan: Admit to telemetry. Cycle cardiac enzymes and continue p.o. Cardizem. Start on Lovenox anticoagulation. Echocardiogram and cardiology consultation requested.  2.  Congestive heart failure, new onset, likely secondary to new-onset atrial fibrillation, rule out acute coronary syndrome. Plan:  Admit to telemetry. Oxygen supplementation. Cycle cardiac enzymes. Intravenous furosemide and potassium supplementation. Order echocardiogram. Cardiology consultation.  3.  Chronic obstructive pulmonary disease on home oxygen, stable. Continue home medications along with oxygen supplementation.  4.  Hypertension, stable on home medications.  5.  Hyperlipidemia on statin. Continue same.  6.  Hypothyroidism on Levoxyl. Continue same. Stable.  7.  Deep vein thrombosis prophylaxis with subcutaneous Lovenox.  8.  Gastrointestinal prophylaxis with proton pump inhibitor.   CODE STATUS:  Full code.   TIME SPENT:  55 minutes.    ____________________________ Juluis Mire, MD enr:nb D: 10/27/2014 03:21:27 ET T: 10/27/2014 03:51:13 ET JOB#: [id number]  cc: Juluis Mire, MD, <Dictator> Christena Flake. Raechel Ache, MD Juluis Mire MD ELECTRONICALLY SIGNED 10/27/2014 20:20

## 2015-01-16 ENCOUNTER — Encounter: Payer: Self-pay | Admitting: Cardiovascular Disease

## 2015-01-16 ENCOUNTER — Ambulatory Visit (INDEPENDENT_AMBULATORY_CARE_PROVIDER_SITE_OTHER): Payer: Medicare PPO | Admitting: Cardiovascular Disease

## 2015-01-16 VITALS — BP 128/72 | HR 53 | Ht 61.5 in | Wt 146.0 lb

## 2015-01-16 DIAGNOSIS — I059 Rheumatic mitral valve disease, unspecified: Secondary | ICD-10-CM

## 2015-01-16 DIAGNOSIS — I10 Essential (primary) hypertension: Secondary | ICD-10-CM

## 2015-01-16 DIAGNOSIS — I340 Nonrheumatic mitral (valve) insufficiency: Secondary | ICD-10-CM

## 2015-01-16 DIAGNOSIS — E785 Hyperlipidemia, unspecified: Secondary | ICD-10-CM

## 2015-01-16 DIAGNOSIS — I4891 Unspecified atrial fibrillation: Secondary | ICD-10-CM | POA: Diagnosis not present

## 2015-01-16 DIAGNOSIS — I5022 Chronic systolic (congestive) heart failure: Secondary | ICD-10-CM | POA: Diagnosis not present

## 2015-01-16 NOTE — Assessment & Plan Note (Signed)
I agree with stopping simvastatin given recent abnormal liver enzymes. Cardiac cath showed no evidence of obstructive coronary artery disease.

## 2015-01-16 NOTE — Assessment & Plan Note (Signed)
She is maintaining in sinus rhythm after cardioversion. Continue current dose of bisoprolol and long-term anticoagulation with Eliquis.

## 2015-01-16 NOTE — Assessment & Plan Note (Signed)
I requested a repeat echocardiogram to evaluate the degree of mitral regurgitation now that she is in sinus rhythm. If there is still significant mitral regurgitation, we have to decide if she is a surgical candidate for mitral valve repair given the presence of COPD. She is going to have pulmonary function tests in the near future. Mitral valve clip might be an option.

## 2015-01-16 NOTE — Patient Instructions (Signed)
Continue same Medications.  Your physician has requested that you have an echocardiogram. Echocardiography is a painless test that uses sound waves to create images of your heart. It provides your doctor with information about the size and shape of your heart and how well your heart's chambers and valves are working. This procedure takes approximately one hour. There are no restrictions for this procedure.  Your physician recommends that you schedule a follow-up appointment in: 1 month f/u with Dr. Fletcher Anon.

## 2015-01-16 NOTE — Progress Notes (Signed)
Primary care physician: Dr. Ezequiel Kayser  HPI  This is a pleasant 80 year old female who is here today for a follow-up visit regarding chronic systolic heart failure, atrial fibrillation and mitral regurgitation.  She has known history of COPD, breast cancer status post right partial mastectomy, hypothyroidism, hyperlipidemia and Mnire disease. She was hospitalized in February at [facility] for worsening shortness of breath, palpitations and orthopnea. She was noted to be in atrial fibrillation with rapid ventricular response with heart rate in the 130 range. There was evidence of fluid overload with BNP of 2500. She underwent an echocardiogram which showed an ejection fraction of 35-40% with anteroseptal hypokinesis, moderately dilated left atrium, small pericardial effusion and moderate to severe mitral regurgitation. She was treated with rate control and diuresis with subsequent improvement. She underwent a right and left cardiac catheterization which showed moderately elevated filling pressures with giant V waves suggestive of significant mitral regurgitation. There was no evidence of obstructive coronary artery disease.  I proceeded with TEE before cardioversion in March which showed no significant change in ejection fraction and valvular abnormalities. There was moderate tricuspid regurgitation with moderate pulmonary hypertension. The mitral valve appeared degenerative with no significant prolapse. Mitral regurgitation was moderate to severe. During last visit, I discontinued diltiazem, increased bisoprolol and added spironolactone. She reports improvement in symptoms but continues to have significant exertional dyspnea even just doing housework. Her husband reports gradual improvement in her symptoms nonetheless.   Allergies  Allergen Reactions  . Augmentin [Amoxicillin-Pot Clavulanate]   . Nifedipine     Other reaction(s): Unknown  . Penicillins     Other reaction(s): Other (See  Comments) Swollen joints  . Propranolol     Other reaction(s): Unknown  . Ace Inhibitors     Other reaction(s): Cough  . Diazepam     Other reaction(s): Other (See Comments) Made her "hyper"  . Meperidine     Other reaction(s): Other (See Comments) Made her "hyper"  . Propoxyphene     Other reaction(s): Other (See Comments) Made her "hyper"     Current Outpatient Prescriptions on File Prior to Visit  Medication Sig Dispense Refill  . apixaban (ELIQUIS) 5 MG TABS tablet Take 1 tablet (5 mg total) by mouth 2 (two) times daily. 60 tablet 6  . benzonatate (TESSALON) 100 MG capsule Take 100 mg by mouth 3 (three) times daily as needed for cough.    . bisoprolol (ZEBETA) 10 MG tablet Take 1 tablet (10 mg total) by mouth daily. 30 tablet 6  . fluticasone (FLONASE) 50 MCG/ACT nasal spray Place into both nostrils as needed.     . furosemide (LASIX) 20 MG tablet Take 20 mg by mouth 2 (two) times daily.     . Indacaterol Maleate 75 MCG CAPS Place into inhaler and inhale daily.    Marland Kitchen ipratropium-albuterol (DUONEB) 0.5-2.5 (3) MG/3ML SOLN Take 3 mLs by nebulization as needed.     Marland Kitchen levothyroxine (SYNTHROID, LEVOTHROID) 88 MCG tablet Take 88 mcg by mouth daily before breakfast.    . losartan (COZAAR) 50 MG tablet Take 1 tablet (50 mg total) by mouth daily. 30 tablet 6  . pantoprazole (PROTONIX) 40 MG tablet Take 40 mg by mouth daily.    Marland Kitchen spironolactone (ALDACTONE) 25 MG tablet Take 1 tablet (25 mg total) by mouth daily. 30 tablet 6   No current facility-administered medications on file prior to visit.     Past Medical History  Diagnosis Date  . Hypertension   . COPD (chronic obstructive  pulmonary disease)   . Hyperlipidemia   . Meniere disease   . HX: breast cancer   . Acute CHF (congestive heart failure)   . Atrial fibrillation   . Mitral regurgitation   . COPD (chronic obstructive pulmonary disease)   . Heart murmur     as child  . Cancer     hx breast cancer  . Hypothyroidism       Past Surgical History  Procedure Laterality Date  . Cochlear implant Bilateral   . Mastectomy, partial Right   . Cardiac catheterization  11/2013    [facility]  . Cardiac catheterization  10/2014    [facility]     Family History  Problem Relation Age of Onset  . Heart disease Father   . Heart disease Brother   . Heart attack Paternal Uncle      History   Social History  . Marital Status: Married    Spouse Name: N/A  . Number of Children: N/A  . Years of Education: N/A   Occupational History  . Not on file.   Social History Main Topics  . Smoking status: Former Smoker -- 40 years    Types: Cigarettes  . Smokeless tobacco: Not on file  . Alcohol Use: Yes     Comment: OCCASIONAL  . Drug Use: No  . Sexual Activity: Not on file   Other Topics Concern  . Not on file   Social History Narrative      PHYSICAL EXAM   BP 128/72 mmHg  Pulse 53  Ht 5' 1.5" (1.562 m)  Wt 146 lb (66.225 kg)  BMI 27.14 kg/m2 Constitutional: She is oriented to person, place, and time. She appears well-developed and well-nourished. No distress.  HENT: No nasal discharge.  Head: Normocephalic and atraumatic.  Eyes: Pupils are equal and round. No discharge.  Neck: Normal range of motion. Neck supple. No JVD present. No thyromegaly present.  Cardiovascular: Bradycardic, regular rhythm, normal heart sounds. Exam reveals no gallop and no friction rub. There is a 2/6 holosystolic murmur at the apex radiating to the axilla.  Pulmonary/Chest: Effort normal and breath sounds normal. No stridor. No respiratory distress. She has no wheezes. She has no rales. She exhibits no tenderness.  Abdominal: Soft. Bowel sounds are normal. She exhibits no distension. There is no tenderness. There is no rebound and no guarding.  Musculoskeletal: Normal range of motion. She exhibits no edema and no tenderness.  Neurological: She is alert and oriented to person, place, and time. Coordination normal.  Skin: Skin is  warm and dry. No rash noted. She is not diaphoretic. No erythema. No pallor.  Psychiatric: She has a normal mood and affect. Her behavior is normal. Judgment and thought content normal.     EKG: Sinus  Bradycardia  - occasional PAC    # PACs = 1. -Left axis -anterior fascicular block.   ABNORMAL    ASSESSMENT AND PLAN

## 2015-01-16 NOTE — Assessment & Plan Note (Signed)
She appears to be euvolemic and currently is on optimal medical therapy. I will consider switching losartan to Entresto.

## 2015-01-17 LAB — PULMONARY FUNCTION TEST

## 2015-01-27 ENCOUNTER — Other Ambulatory Visit: Payer: Self-pay

## 2015-01-27 ENCOUNTER — Ambulatory Visit (INDEPENDENT_AMBULATORY_CARE_PROVIDER_SITE_OTHER): Payer: Medicare PPO

## 2015-01-27 DIAGNOSIS — I059 Rheumatic mitral valve disease, unspecified: Secondary | ICD-10-CM | POA: Diagnosis not present

## 2015-02-07 ENCOUNTER — Other Ambulatory Visit: Payer: Self-pay | Admitting: Internal Medicine

## 2015-02-07 DIAGNOSIS — Z1231 Encounter for screening mammogram for malignant neoplasm of breast: Secondary | ICD-10-CM

## 2015-02-14 NOTE — Telephone Encounter (Signed)
This encounter was created in error - please disregard.

## 2015-02-15 ENCOUNTER — Ambulatory Visit
Admission: RE | Admit: 2015-02-15 | Discharge: 2015-02-15 | Disposition: A | Discharge status: Home / Self Care | Payer: Medicare PPO | Source: Ambulatory Visit | Attending: Internal Medicine | Admitting: Internal Medicine

## 2015-02-15 DIAGNOSIS — Z1231 Encounter for screening mammogram for malignant neoplasm of breast: Secondary | ICD-10-CM | POA: Diagnosis present

## 2015-02-16 ENCOUNTER — Encounter: Payer: Self-pay | Admitting: Cardiovascular Disease

## 2015-02-16 ENCOUNTER — Ambulatory Visit (INDEPENDENT_AMBULATORY_CARE_PROVIDER_SITE_OTHER): Payer: Medicare PPO | Admitting: Cardiovascular Disease

## 2015-02-16 VITALS — BP 140/70 | HR 55 | Ht 61.5 in | Wt 145.5 lb

## 2015-02-16 DIAGNOSIS — I059 Rheumatic mitral valve disease, unspecified: Secondary | ICD-10-CM

## 2015-02-16 DIAGNOSIS — I340 Nonrheumatic mitral (valve) insufficiency: Secondary | ICD-10-CM | POA: Diagnosis not present

## 2015-02-16 DIAGNOSIS — I4891 Unspecified atrial fibrillation: Secondary | ICD-10-CM

## 2015-02-16 DIAGNOSIS — R0602 Shortness of breath: Secondary | ICD-10-CM | POA: Diagnosis not present

## 2015-02-16 DIAGNOSIS — I5022 Chronic systolic (congestive) heart failure: Secondary | ICD-10-CM

## 2015-02-16 DIAGNOSIS — I10 Essential (primary) hypertension: Secondary | ICD-10-CM

## 2015-02-16 NOTE — Assessment & Plan Note (Signed)
In spite of maintaining sinus rhythm and some improvement of LV systolic function, mitral regurgitation appears to be severe now and worse than before as evidenced by elevated E velocity on echo, more dilated left atrium and progression of pulmonary hypertension. Thus, we should evaluate for mitral valve repair. I referred the patient to Dr. Roxy Manns.  She does have known history of moderate COPD with expected higher risk for surgery.

## 2015-02-16 NOTE — Assessment & Plan Note (Signed)
Blood pressure is controlled on current medications. 

## 2015-02-16 NOTE — Patient Instructions (Signed)
Medication Instructions:  Your physician recommends that you continue on your current medications as directed. Please refer to the Current Medication list given to you today.   Labwork: none  Testing/Procedures: none  Follow-Up: Your physician recommends that you schedule a follow-up appointment in: three months with Dr. Fletcher Anon.   Any Other Special Instructions Will Be Listed Below (If Applicable). Your physician has referred you cardiothoracic surgery, Dr. Roxy Manns.

## 2015-02-16 NOTE — Progress Notes (Signed)
Primary care physician: Dr. Ezequiel Kayser  HPI  This is a pleasant 80 year old female who is here today for a follow-up visit regarding chronic systolic heart failure, atrial fibrillation and mitral regurgitation.  She has known history of COPD, breast cancer status post right partial mastectomy, hypothyroidism, hyperlipidemia and Mnire disease. She was hospitalized in February at [facility] for worsening shortness of breath, palpitations and orthopnea. She was noted to be in atrial fibrillation with rapid ventricular response with heart rate in the 130 range. There was evidence of fluid overload with BNP of 2500. She underwent an echocardiogram which showed an ejection fraction of 35-40% with anteroseptal hypokinesis, moderately dilated left atrium, small pericardial effusion and moderate to severe mitral regurgitation. She was treated with rate control and diuresis with subsequent improvement. She underwent a right and left cardiac catheterization which showed moderately elevated filling pressures with giant V waves suggestive of significant mitral regurgitation. There was no evidence of obstructive coronary artery disease.  I proceeded with TEE before cardioversion in March which showed no significant change in ejection fraction and valvular abnormalities. There was moderate tricuspid regurgitation with moderate pulmonary hypertension. The mitral valve appeared degenerative with no significant prolapse. Mitral regurgitation was moderate to severe. She has been on optimal medical therapy and I decided to repeat the echocardiogram now that she is in normal sinus rhythm to better evaluate mitral valve regurgitation. Echo showed actually improvement in LV systolic function to an ejection fraction of 50-55%. The left atrium was moderately to severely dilated with evidence of severe mitral regurgitation with a posterior jet. There was also associated severe pulmonary hypertension with an estimated systolic pulmonary  pressure of 72 mmHg. She continues to have dyspnea with moderate activities and has intermittent palpitations. No chest pain. No orthopnea, PND or leg edema. She saw Dr. Raul Del last month with repeat pulmonary function tests. It showed an FVC of 2.26 and FEV1 of 1.73 which is 70% of predicted.  Allergies  Allergen Reactions  . Augmentin [Amoxicillin-Pot Clavulanate]   . Nifedipine     Other reaction(s): Unknown  . Penicillins     Other reaction(s): Other (See Comments) Swollen joints  . Propranolol     Other reaction(s): Unknown  . Ace Inhibitors     Other reaction(s): Cough  . Diazepam     Other reaction(s): Other (See Comments) Made her "hyper"  . Meperidine     Other reaction(s): Other (See Comments) Made her "hyper"  . Propoxyphene     Other reaction(s): Other (See Comments) Made her "hyper"     Current Outpatient Prescriptions on File Prior to Visit  Medication Sig Dispense Refill  . apixaban (ELIQUIS) 5 MG TABS tablet Take 1 tablet (5 mg total) by mouth 2 (two) times daily. 60 tablet 6  . benzonatate (TESSALON) 100 MG capsule Take 100 mg by mouth 3 (three) times daily as needed for cough.    . bisoprolol (ZEBETA) 10 MG tablet Take 1 tablet (10 mg total) by mouth daily. 30 tablet 6  . fluticasone (FLONASE) 50 MCG/ACT nasal spray Place into both nostrils as needed.     . furosemide (LASIX) 20 MG tablet Take 20 mg by mouth 2 (two) times daily.     . Indacaterol Maleate 75 MCG CAPS Place into inhaler and inhale daily.    Marland Kitchen ipratropium-albuterol (DUONEB) 0.5-2.5 (3) MG/3ML SOLN Take 3 mLs by nebulization as needed.     Marland Kitchen levothyroxine (SYNTHROID, LEVOTHROID) 88 MCG tablet Take 88 mcg by mouth daily before  breakfast.    . Multiple Vitamins-Minerals (PRESERVISION AREDS PO) Take by mouth.    . pantoprazole (PROTONIX) 40 MG tablet Take 40 mg by mouth daily.    Marland Kitchen spironolactone (ALDACTONE) 25 MG tablet Take 1 tablet (25 mg total) by mouth daily. 30 tablet 6   No current  facility-administered medications on file prior to visit.     Past Medical History  Diagnosis Date  . Hypertension   . COPD (chronic obstructive pulmonary disease)   . Hyperlipidemia   . Meniere disease   . HX: breast cancer   . Acute CHF (congestive heart failure)   . Atrial fibrillation   . Mitral regurgitation   . COPD (chronic obstructive pulmonary disease)   . Heart murmur     as child  . Hypothyroidism   . Cancer     hx breast cancer  . Breast cancer 08/09/2001    right/rad     Past Surgical History  Procedure Laterality Date  . Cochlear implant Bilateral   . Mastectomy, partial Right   . Cardiac catheterization  11/2013    [facility]  . Cardiac catheterization  10/2014    [facility]  . Breast biopsy Left 11/25/06    neg  . Breast biopsy Left 01/20/12    lt bx /clip-neg     Family History  Problem Relation Age of Onset  . Heart disease Father   . Heart disease Brother   . Heart attack Paternal Uncle      History   Social History  . Marital Status: Married    Spouse Name: N/A  . Number of Children: N/A  . Years of Education: N/A   Occupational History  . Not on file.   Social History Main Topics  . Smoking status: Former Smoker -- 40 years    Types: Cigarettes  . Smokeless tobacco: Not on file  . Alcohol Use: Yes     Comment: OCCASIONAL  . Drug Use: No  . Sexual Activity: Not on file   Other Topics Concern  . Not on file   Social History Narrative      PHYSICAL EXAM   BP 140/70 mmHg  Pulse 55  Ht 5' 1.5" (1.562 m)  Wt 145 lb 8 oz (65.998 kg)  BMI 27.05 kg/m2 Constitutional: She is oriented to person, place, and time. She appears well-developed and well-nourished. No distress.  HENT: No nasal discharge.  Head: Normocephalic and atraumatic.  Eyes: Pupils are equal and round. No discharge.  Neck: Normal range of motion. Neck supple. Mild JVD present. No thyromegaly present.  Cardiovascular: Bradycardic, regular rhythm, normal heart sounds.  Exam reveals no gallop and no friction rub. There is a 2/6 holosystolic murmur at the apex radiating to the axilla.  Pulmonary/Chest: Effort normal and breath sounds normal. No stridor. No respiratory distress. She has no wheezes. She has no rales. She exhibits no tenderness.  Abdominal: Soft. Bowel sounds are normal. She exhibits no distension. There is no tenderness. There is no rebound and no guarding.  Musculoskeletal: Normal range of motion. She exhibits no edema and no tenderness.  Neurological: She is alert and oriented to person, place, and time. Coordination normal.  Skin: Skin is warm and dry. No rash noted. She is not diaphoretic. No erythema. No pallor.  Psychiatric: She has a normal mood and affect. Her behavior is normal. Judgment and thought content normal.     EKG: Sinus  Bradycardia  -Left axis -anterior fascicular block.   ABNORMAL  ASSESSMENT AND PLAN

## 2015-02-16 NOTE — Assessment & Plan Note (Signed)
She is on optimal medical therapy and currently [employer] [place] Heart Association class III. Ejection fraction improved with medications.

## 2015-02-16 NOTE — Assessment & Plan Note (Signed)
She is maintaining in sinus rhythm and currently on anticoagulation with Eliquis. A maze procedure should be considered with mitral valve surgery.

## 2015-03-09 ENCOUNTER — Encounter: Payer: Self-pay | Admitting: Thoracic Surgery (Cardiothoracic Vascular Surgery)

## 2015-03-09 ENCOUNTER — Institutional Professional Consult (permissible substitution) (INDEPENDENT_AMBULATORY_CARE_PROVIDER_SITE_OTHER): Payer: Medicare PPO | Admitting: Thoracic Surgery (Cardiothoracic Vascular Surgery)

## 2015-03-09 ENCOUNTER — Other Ambulatory Visit: Payer: Self-pay | Admitting: *Deleted

## 2015-03-09 VITALS — BP 174/69 | HR 48 | Resp 20 | Ht 61.5 in | Wt 143.0 lb

## 2015-03-09 DIAGNOSIS — I5022 Chronic systolic (congestive) heart failure: Secondary | ICD-10-CM

## 2015-03-09 DIAGNOSIS — I7409 Other arterial embolism and thrombosis of abdominal aorta: Secondary | ICD-10-CM

## 2015-03-09 DIAGNOSIS — I340 Nonrheumatic mitral (valve) insufficiency: Secondary | ICD-10-CM

## 2015-03-09 DIAGNOSIS — I4891 Unspecified atrial fibrillation: Secondary | ICD-10-CM

## 2015-03-09 DIAGNOSIS — I5042 Chronic combined systolic (congestive) and diastolic (congestive) heart failure: Secondary | ICD-10-CM | POA: Insufficient documentation

## 2015-03-09 DIAGNOSIS — I270 Primary pulmonary hypertension: Secondary | ICD-10-CM

## 2015-03-09 DIAGNOSIS — I719 Aortic aneurysm of unspecified site, without rupture: Secondary | ICD-10-CM

## 2015-03-09 DIAGNOSIS — I071 Rheumatic tricuspid insufficiency: Secondary | ICD-10-CM | POA: Insufficient documentation

## 2015-03-09 DIAGNOSIS — I272 Pulmonary hypertension, unspecified: Secondary | ICD-10-CM | POA: Insufficient documentation

## 2015-03-09 NOTE — Progress Notes (Signed)
[name]       [place] [address]             [phone]     CARDIOTHORACIC SURGERY CONSULTATION REPORT  Referring Provider is Jamie Hampshire, MD PCP is Jamie Kayser, MD  Chief Complaint  Patient presents with  . Mitral Regurgitation    Surgical eval, ECHO 01/27/15, Cardaic Cath 10/28/14  . Atrial Fibrillation    HPI:  Patient is a 80 year old female from [facility] with history of hypertension, COPD, hyperlipidemia, and Mnire's disease who more recently has been diagnosed with acute on chronic combined systolic and diastolic congestive heart failure with mitral regurgitation, tricuspid regurgitation, and persistent atrial fibrillation status post DC cardioversion who has now been referred for surgical consultation to discuss options for management of mitral regurgitation, tricuspid regurgitation, and atrial fibrillation. The patient states that she suffered from scarlet fever at age 68 or 83. She was first told that she had a heart murmur at age 35. She never had any formal cardiac evaluation until more recently. She has a long-standing history of tobacco abuse but quit smoking in 1999. She has remained physically active and functional independent until the past year when she began to develop progressive symptoms of exertional shortness of breath. Symptoms became acutely worse in December and were initially attributed to COPD and she was treated with bronchodilator therapy and home oxygen. The patient was hospitalized in February of this year at [facility] with acute systolic congestive heart failure with presumably new-onset atrial fibrillation.  Transthoracic echocardiogram performed at that time reportedly demonstrated ejection fraction 35-40% with apical wall hypokinesis and moderate to severe mitral regurgitation. Left and right heart catheterization was performed 10/28/2014 demonstrating mild nonobstructive coronary artery disease.   Left ventricular ejection fraction was estimated 40%. Right heart catheterization revealed moderate pulmonary hypertension with PA pressures measured 50/28 mmHg and mean pulmonary capillary wedge pressure 26 mmHg. There were large V waves consistent with severe mitral regurgitation.  Mean central venous pressure was measured 18 mmHg.  The patient's atrial fibrillation was treated using Cardizem and bisoprolol for rate control with Eliquis for anticoagulation.  One month later the patient underwent TEE guided cardioversion, and she has been maintaining sinus rhythm ever since. The patient has been followed carefully by Dr. Fletcher Anon and has done fairly well on medical therapy. However, the patient continues to experience stable symptoms of exertional shortness of breath consistent with chronic combined systolic and diastolic congestive heart failure, New York Heart Association functional class IIB. Follow-up transthoracic echocardiogram performed 01/27/2015 revealed improved left ventricular systolic function with ejection fraction estimated 50-55%. However, there remained severe mitral regurgitation. The patient was referred for surgical consultation.  The patient is married and lives locally in [place] with her husband. 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 The patient has remained fairly active physically until the past year. She exercises regularly, including [employer] senior aerobics class that she enjoys. However, over the past year the patient has experienced progressive symptoms of exertional shortness of breath culminating in her acute exacerbation this past February. Since the patient underwent DC cardioversion and has been treated with medical therapy including lasix her symptoms have improved. However, she continues to experience exertional shortness of breath with moderate physical activity. This limits her activities to  some degree. She does not get short of breath  with low-level activities but she does wear oxygen routinely at home both for sleeping and daily activities. She denies recent symptoms of PND, orthopnea, dizzy spells, or syncope. She has some mild lower extremity edema that she controls with diuretics. She developed some substernal chest pressure with exertion when her shortness of breath is severe. She otherwise denies any history of chest discomfort.  Her physical mobility and functional capacity is otherwise limited only by mild chronic problems related to poor balance that has been chronically attributed to her Mnire's disease.  Past Medical History  Diagnosis Date  . Hypertension   . COPD (chronic obstructive pulmonary disease)   . Hyperlipidemia   . Meniere disease   . HX: breast cancer   . Acute CHF (congestive heart failure)   . Atrial fibrillation   . Mitral regurgitation   . COPD (chronic obstructive pulmonary disease)   . Heart murmur     as child  . Hypothyroidism   . Cancer     hx breast cancer  . Breast cancer 08/09/2001    right/rad  . Pulmonary hypertension   . Tricuspid regurgitation   . Chronic combined systolic and diastolic CHF (congestive heart failure)     Past Surgical History  Procedure Laterality Date  . Cochlear implant Bilateral   . Mastectomy, partial Right   . Cardiac catheterization  11/2013    [facility]  . Cardiac catheterization  10/2014    [facility]  . Breast biopsy Left 11/25/06    neg  . Breast biopsy Left 01/20/12    lt bx /clip-neg    Family History  Problem Relation Age of Onset  . Heart disease Father   . Heart disease Brother   . Heart attack Paternal Uncle     History   Social History  . Marital Status: Married    Spouse Name: N/A  . Number of Children: N/A  . Years of Education: N/A   Occupational History  . Not on file.   Social History Main Topics  . Smoking status: Former Smoker -- 40 years    Types: Cigarettes  . Smokeless  tobacco: Not on file  . Alcohol Use: Yes     Comment: OCCASIONAL  . Drug Use: No  . Sexual Activity: Not on file   Other Topics Concern  . Not on file   Social History Narrative    Current Outpatient Prescriptions  Medication Sig Dispense Refill  . apixaban (ELIQUIS) 5 MG TABS tablet Take 1 tablet (5 mg total) by mouth 2 (two) times daily. 60 tablet 6  . benzonatate (TESSALON) 100 MG capsule Take 100 mg by mouth 3 (three) times daily as needed for cough.    . bisoprolol (ZEBETA) 10 MG tablet Take 1 tablet (10 mg total) by mouth daily. 30 tablet 6  . fluticasone (FLONASE) 50 MCG/ACT nasal spray Place into both nostrils as needed.     . furosemide (LASIX) 20 MG tablet Take 20 mg by mouth 2 (two) times daily.     . Indacaterol Maleate 75 MCG CAPS Place into inhaler and inhale daily.    Marland Kitchen ipratropium-albuterol (DUONEB) 0.5-2.5 (3) MG/3ML SOLN Take 3 mLs by nebulization as needed.     Marland Kitchen levothyroxine (SYNTHROID, LEVOTHROID) 88 MCG tablet Take 88 mcg by mouth daily before breakfast.    . losartan (COZAAR) 100 MG tablet Take 100 mg by mouth daily.    . Multiple Vitamins-Minerals (PRESERVISION AREDS PO) Take by mouth.    . pantoprazole (PROTONIX) 40  MG tablet Take 40 mg by mouth daily.    Marland Kitchen spironolactone (ALDACTONE) 25 MG tablet Take 1 tablet (25 mg total) by mouth daily. 30 tablet 6   No current facility-administered medications for this visit.    Allergies  Allergen Reactions  . Augmentin [Amoxicillin-Pot Clavulanate]   . Nifedipine     Other reaction(s): Unknown  . Penicillins     Other reaction(s): Other (See Comments) Swollen joints  . Propranolol     Other reaction(s): Unknown  . Ace Inhibitors     Other reaction(s): Cough  . Diazepam     Other reaction(s): Other (See Comments) Made her "hyper"  . Meperidine     Other reaction(s): Other (See Comments) Made her "hyper"  . Propoxyphene     Other reaction(s): Other (See Comments) Made her "hyper"      Review of  Systems:   General:  normal appetite, decreased energy, no weight gain, no weight loss, no fever  Cardiac:  + chest pain with exertion, no chest pain at rest, +SOB with exertion, no resting SOB, no PND, no orthopnea, + palpitations, + arrhythmia, + atrial fibrillation, + LE edema, no dizzy spells, no syncope  Respiratory:  + shortness of breath, + home oxygen, no productive cough, no dry cough, no bronchitis, no wheezing, no hemoptysis, no asthma, no pain with inspiration or cough, no sleep apnea, no CPAP at night  GI:   no difficulty swallowing, + reflux, no frequent heartburn, no hiatal hernia, no abdominal pain, no constipation, no diarrhea, no hematochezia, no hematemesis, no melena  GU:   no dysuria,  no frequency, no urinary tract infection, no hematuria, no kidney stones, no kidney disease  Vascular:  no pain suggestive of claudication, no pain in feet, no leg cramps, no varicose veins, no DVT, no non-healing foot ulcer  Neuro:   no stroke, no TIA's, no seizures, no headaches, no temporary blindness one eye,  no slurred speech, no peripheral neuropathy, no chronic pain, mild instability of gait, no memory/cognitive dysfunction  Musculoskeletal: + mild arthritis in both hips and knees but not limiting, no joint swelling, no myalgias, minor difficulty walking due to poor balance, near normal mobility   Skin:   no rash, no itching, no skin infections, no pressure sores or ulcerations  Psych:   no anxiety, no depression, no nervousness, no unusual recent stress  Eyes:   no blurry vision, no floaters, no recent vision changes, + wears glasses or contacts  ENT:   + hearing loss, no loose or painful teeth, no dentures, last saw dentist 02/15/2015  Hematologic:  no easy bruising, no abnormal bleeding, no clotting disorder, no frequent epistaxis  Endocrine:  + diabetes, does not check CBG's at home     Physical Exam:   BP 174/69 mmHg  Pulse 48  Resp 20  Ht 5' 1.5" (1.562 m)  Wt 143 lb (64.864 kg)   BMI 26.59 kg/m2  SpO2 91%  General:  Thin, somewhat frail but o/w  well-appearing  HEENT:  Unremarkable   Neck:   no JVD, no bruits, no adenopathy   Chest:   clear to auscultation, symmetrical breath sounds, no wheezes, no rhonchi   CV:   RRR, grade IV/VI holosystolic murmur best LSB  Abdomen:  soft, non-tender, no masses   Extremities:  warm, well-perfused, pulses diminished, no LE edema  Rectal/GU  Deferred  Neuro:   Grossly non-focal and symmetrical throughout  Skin:   Clean and dry, no rashes, no breakdown  Diagnostic Tests:  Transthoracic Echocardiography  Patient:  [name], [name] MR #:    [id number] Study Date: 01/27/2015 Gender:   F Age:    55 Height:   157.5 cm Weight:   66.2 kg BSA:    1.72 m^2 Pt. Status: Room:  ATTENDING  Jamie Sacramento, MD ORDERING  Jamie Sacramento, MD REFERRING  Jamie Sacramento, MD PERFORMING [facility], [name]  cc:  ------------------------------------------------------------------- LV EF: 50% -  55%  ------------------------------------------------------------------- History:  PMH:  Mild chest pressure. Dyspnea. Atrial fibrillation. The dysfunction is both systolic and diastolic. Mitral valve disease. Tricuspid valve disease. Primary pulmonary hypertension. Risk factors: Hypertension. Dyslipidemia.  ------------------------------------------------------------------- Study Conclusions  - Left ventricle: The cavity size was normal. Wall thickness was normal. Systolic function was normal. The estimated ejection fraction was in the range of 50% to 55%. Wall motion was normal; there were no regional wall motion abnormalities. Features are consistent with a pseudonormal left ventricular filling pattern, with concomitant abnormal relaxation and increased filling pressure (grade 2 diastolic dysfunction). - Mitral valve: Mild myxomatous degeneration. There was severe regurgitation directed  posteriorly. - Left atrium: The atrium was moderately to severely dilated. - Right atrium: The atrium was mildly dilated. - Tricuspid valve: There was moderate regurgitation. - Pulmonary arteries: Systolic pressure was severely increased. PA peak pressure: 72 mm Hg (S). - Pericardium, extracardiac: A trivial pericardial effusion was identified.  ------------------------------------------------------------------- Labs, prior tests, procedures, and surgery: Echocardiography (10/11/2014).   EF was 40%.  ECG.   Abnormal. Transthoracic echocardiography. M-mode, complete 2D, spectral Doppler, and color Doppler. Birthdate: Patient birthdate: November 18, 1934. Age: Patient is 80 yr old. Sex: Gender: female. BMI: 26.7 kg/m^2. Blood pressure:   118/72 Patient status: Outpatient. Study date: Study date: 01/27/2015. Study time: 12:54 PM.  -------------------------------------------------------------------  ------------------------------------------------------------------- Left ventricle: The cavity size was normal. Wall thickness was normal. Systolic function was normal. The estimated ejection fraction was in the range of 50% to 55%. Wall motion was normal; there were no regional wall motion abnormalities. Features are consistent with a pseudonormal left ventricular filling pattern, with concomitant abnormal relaxation and increased filling pressure (grade 2 diastolic dysfunction).  ------------------------------------------------------------------- Aortic valve:  Structurally normal valve.  Cusp separation was normal. Doppler: Transvalvular velocity was within the normal range. There was no stenosis. There was no regurgitation.  ------------------------------------------------------------------- Mitral valve:  Mild myxomatous degeneration.  Leaflet separation was normal. Doppler: Transvalvular velocity was within the normal range. There was no evidence for  stenosis. There was severe regurgitation directed posteriorly.  Valve area by pressure half-time: 2.82 cm^2. Indexed valve area by pressure half-time: 1.64 cm^2/m^2. Valve area by continuity equation (using LVOT flow): 1.32 cm^2. Indexed valve area by continuity equation (using LVOT flow): 0.77 cm^2/m^2.  Mean gradient (D): 3 mm Hg. Peak gradient (D): 9 mm Hg.  ------------------------------------------------------------------- Left atrium: The atrium was moderately to severely dilated.  ------------------------------------------------------------------- Right ventricle: The cavity size was normal. Wall thickness was normal. Systolic function was normal.  ------------------------------------------------------------------- Pulmonic valve:  Structurally normal valve.  Cusp separation was normal. Doppler: Transvalvular velocity was within the normal range. There was trivial regurgitation.  ------------------------------------------------------------------- Tricuspid valve:  Structurally normal valve.  Leaflet separation was normal. Doppler: Transvalvular velocity was within the normal range. There was moderate regurgitation.  ------------------------------------------------------------------- Pulmonary artery:  Systolic pressure was severely increased.  ------------------------------------------------------------------- Right atrium: The atrium was mildly dilated.  ------------------------------------------------------------------- Pericardium: A trivial pericardial effusion was identified.  ------------------------------------------------------------------- Systemic veins: Inferior vena cava: The vessel was normal in size. The respirophasic diameter changes  were blunted (< 50%).  ------------------------------------------------------------------- Measurements  Left ventricle              Value     Reference LV ID, ED, PLAX chordal       (H)   53.8 mm    43 - 52 LV ID, ES, PLAX chordal      (H)   46.3 mm    23 - 38 LV fx shortening, PLAX chordal  (L)   14  %    >=29 LV PW thickness, ED            8.6  mm    --------- IVS/LV PW ratio, ED        (H)   1.41      <=1.3 Stroke volume, 2D             63  ml    --------- Stroke volume/bsa, 2D           37  ml/m^2  --------- LV e&', lateral              5.07 cm/s   --------- LV E/e&', lateral             28.99     --------- LV e&', medial               2.97 cm/s   --------- LV E/e&', medial              49.49     --------- LV e&', average              4.02 cm/s   --------- LV E/e&', average             36.57     ---------  Ventricular septum            Value     Reference IVS thickness, ED             12.1 mm    ---------  LVOT                   Value     Reference LVOT ID, S                19  mm    --------- LVOT area                 2.84 cm^2   --------- LVOT ID                  19  mm    --------- LVOT peak velocity, S           89.4 cm/s   --------- LVOT mean velocity, S           60.3 cm/s   --------- LVOT VTI, S                22.1 cm    --------- LVOT peak gradient, S           3   mm Hg  --------- Stroke volume (SV), LVOT DP        62.7 ml    --------- Stroke index (SV/bsa), LVOT DP      36.4 ml/m^2  ---------  Aorta                   Value     Reference Aortic root ID, ED            28  mm    ---------  Left atrium                Value     Reference LA ID, A-P, ES               52  mm    --------- LA ID/bsa, A-P          (H)   3.02 cm/m^2  <=2.2 LA volume, S               110  ml    --------- LA volume/bsa, S             64  ml/m^2  --------- LA volume, ES, 1-p A4C          106  ml    --------- LA volume/bsa, ES, 1-p A4C        61.7 ml/m^2  --------- LA volume, ES, 1-p A2C          109  ml    --------- LA volume/bsa, ES, 1-p A2C        63.4 ml/m^2  ---------  Mitral valve               Value     Reference Mitral E-wave peak velocity        147  cm/s   --------- Mitral A-wave peak velocity        71  cm/s   --------- Mitral mean velocity, D          73.4 cm/s   --------- Mitral deceleration time         153  ms    150 - 230 Mitral pressure half-time         78  ms    --------- Mitral mean gradient, D          3   mm Hg  --------- Mitral peak gradient, D          9   mm Hg  --------- Mitral E/A ratio, peak          2.1      --------- Mitral valve area, PHT, DP        2.82 cm^2   --------- Mitral valve area/bsa, PHT, DP      1.64 cm^2/m^2 --------- Mitral valve area, LVOT          1.32 cm^2   --------- continuity Mitral valve area/bsa, LVOT        0.77 cm^2/m^2 --------- continuity Mitral annulus VTI, D           47.7 cm    ---------  Pulmonary arteries            Value     Reference PA pressure, S, DP        (H)   72  mm Hg  <=30  Tricuspid valve              Value     Reference Tricuspid maximal inflow         376  cm/s   --------- velocity, PISA  Legend: (L) and (H) mark values outside specified reference range.  ------------------------------------------------------------------- Prepared  and Electronically Authenticated by  Jamie Sacramento, MD 2016-05-20T17:48:19   Impression:  Patient has stage D severe symptomatic primary mitral regurgitation.  She presents with a one-year history of progressive symptoms of exertional shortness of breath consistent with combined systolic and diastolic congestive heart failure. She presented acutely in February with presumably new onset persistent atrial fibrillation. She underwent successful DC  cardioversion in March and has remained clinically stable in sinus rhythm since that time. However, she continues to experience symptoms of exertional shortness of breath consistent with chronic combined systolic and diastolic congestive heart failure, New York Heart Association functional class IIB.   I have personally reviewed the transthoracic echocardiogram performed 01/27/2015. Image quality is not good, but the patient clearly has severe mitral regurgitation.  The jet of regurgitation is somewhat eccentric and directed posteriorly. There are no obvious areas of leaflet prolapse. The patient has a reported history of scarlet fever as a child and long history of heart murmur. I am suspicious that she may have underlying rheumatic heart disease. Left ventricular systolic function appears only mild to moderately reduced with ejection fraction estimated 50-55%. This is reportedly considerably better than it looked on echocardiograms performed in February and March of this year.  There remained a moderate tricuspid regurgitation.  Unfortunately, images from the transesophageal echocardiogram performed in March of this year are not currently available for review.  Similarly, images from the patient's diagnostic cardiac catheterization performed in February of this year are not currently available for review.  Finally, the patient has some degree of significant underlying COPD, although pulmonary function test performed in May at the [facility] suggest that the  severity of COPD may be only mild to moderate. The patient quit smoking in the remote past.   Plan:  I discussed matters at length with the patient and her husband in the office today. We discussed the natural history of severe symptomatic mitral regurgitation and options for treatment. We will request that copies of the patient's previous transesophageal echocardiogram and diagnostic cardiac catheterization be made available for our direct review. The patient will additionally undergo repeat pulmonary function tests pre-and post bronchodilator therapy and obtain arterial blood gas on room air. We will obtain CT angiogram to evaluate potential alternative sites for arterial cannulation with elective surgery. The patient will return in 3 weeks to review the results of these tests and discuss treatment options further.   I spent in excess of 90 minutes during the conduct of this office consultation and >50% of this time involved direct face-to-face encounter with the patient for counseling and/or coordination of their care.   Jamie Gu. Roxy Manns, MD 03/09/2015 12:10 PM

## 2015-03-09 NOTE — Patient Instructions (Signed)
Schedule CT angiogram and pulmonary function tests as soon as practical  The patient should continue all previous medications without changes at this time

## 2015-03-16 ENCOUNTER — Other Ambulatory Visit: Payer: Self-pay

## 2015-03-16 DIAGNOSIS — I712 Thoracic aortic aneurysm, without rupture, unspecified: Secondary | ICD-10-CM

## 2015-03-17 ENCOUNTER — Ambulatory Visit
Admission: RE | Admit: 2015-03-17 | Discharge: 2015-03-17 | Disposition: A | Discharge status: Home / Self Care | Payer: Medicare PPO | Source: Ambulatory Visit | Attending: Thoracic Surgery (Cardiothoracic Vascular Surgery) | Admitting: Thoracic Surgery (Cardiothoracic Vascular Surgery)

## 2015-03-17 ENCOUNTER — Ambulatory Visit (HOSPITAL_COMMUNITY)
Admission: RE | Admit: 2015-03-17 | Discharge: 2015-03-17 | Disposition: A | Discharge status: Home / Self Care | Payer: Medicare PPO | Source: Ambulatory Visit | Attending: Thoracic Surgery (Cardiothoracic Vascular Surgery) | Admitting: Thoracic Surgery (Cardiothoracic Vascular Surgery)

## 2015-03-17 DIAGNOSIS — I7409 Other arterial embolism and thrombosis of abdominal aorta: Secondary | ICD-10-CM

## 2015-03-17 DIAGNOSIS — I719 Aortic aneurysm of unspecified site, without rupture: Secondary | ICD-10-CM | POA: Insufficient documentation

## 2015-03-17 DIAGNOSIS — I700 Atherosclerosis of aorta: Secondary | ICD-10-CM | POA: Insufficient documentation

## 2015-03-17 LAB — PULMONARY FUNCTION TEST
DL/VA % pred: 54 %
DL/VA: 2.45 ml/min/mmHg/L
DLCO unc % pred: 35 %
DLCO unc: 7.39 ml/min/mmHg
FEF 25-75 Post: 0.47 L/sec
FEF 25-75 Pre: 0.57 L/sec
FEF2575-%Change-Post: -18 %
FEF2575-%Pred-Post: 34 %
FEF2575-%Pred-Pre: 41 %
FEV1-%Change-Post: -5 %
FEV1-%Pred-Post: 63 %
FEV1-%Pred-Pre: 67 %
FEV1-POST: 1.13 L
FEV1-Pre: 1.2 L
FEV1FVC-%CHANGE-POST: -2 %
FEV1FVC-%Pred-Pre: 84 %
FEV6-%Change-Post: -3 %
FEV6-%PRED-PRE: 84 %
FEV6-%Pred-Post: 80 %
FEV6-Post: 1.82 L
FEV6-Pre: 1.89 L
FEV6FVC-%CHANGE-POST: 0 %
FEV6FVC-%PRED-POST: 104 %
FEV6FVC-%Pred-Pre: 105 %
FVC-%Change-Post: -3 %
FVC-%PRED-POST: 77 %
FVC-%PRED-PRE: 80 %
FVC-PRE: 1.92 L
FVC-Post: 1.86 L
PRE FEV1/FVC RATIO: 62 %
Post FEV1/FVC ratio: 61 %
Post FEV6/FVC ratio: 98 %
Pre FEV6/FVC Ratio: 99 %
RV % PRED: 96 %
RV: 2.15 L
TLC % PRED: 86 %
TLC: 4.06 L

## 2015-03-17 LAB — BLOOD GAS, ARTERIAL
ACID-BASE EXCESS: 3.8 mmol/L — AB (ref 0.0–2.0)
Bicarbonate: 27.6 mEq/L — ABNORMAL HIGH (ref 20.0–24.0)
DRAWN BY: 280981
FIO2: 0.21 %
O2 Saturation: 90.5 %
PCO2 ART: 40.3 mmHg (ref 35.0–45.0)
PO2 ART: 60.4 mmHg — AB (ref 80.0–100.0)
Patient temperature: 98.6
TCO2: 28.8 mmol/L (ref 0–100)
pH, Arterial: 7.45 (ref 7.350–7.450)

## 2015-03-17 MED ORDER — IOPAMIDOL (ISOVUE-370) INJECTION 76%
75.0000 mL | Freq: Once | INTRAVENOUS | Status: AC | PRN
  Administered 2015-03-17: 75 mL via INTRAVENOUS

## 2015-03-17 MED ORDER — ALBUTEROL SULFATE (2.5 MG/3ML) 0.083% IN NEBU
2.5000 mg | INHALATION_SOLUTION | Freq: Once | RESPIRATORY_TRACT | Status: AC
  Administered 2015-03-17: 2.5 mg via RESPIRATORY_TRACT

## 2015-03-28 ENCOUNTER — Encounter: Payer: Self-pay | Admitting: Thoracic Surgery (Cardiothoracic Vascular Surgery)

## 2015-03-28 ENCOUNTER — Ambulatory Visit (INDEPENDENT_AMBULATORY_CARE_PROVIDER_SITE_OTHER): Payer: Medicare PPO | Admitting: Thoracic Surgery (Cardiothoracic Vascular Surgery)

## 2015-03-28 VITALS — BP 154/74 | HR 62 | Resp 20 | Ht 61.5 in | Wt 143.0 lb

## 2015-03-28 DIAGNOSIS — I5042 Chronic combined systolic (congestive) and diastolic (congestive) heart failure: Secondary | ICD-10-CM

## 2015-03-28 DIAGNOSIS — I270 Primary pulmonary hypertension: Secondary | ICD-10-CM | POA: Diagnosis not present

## 2015-03-28 DIAGNOSIS — I071 Rheumatic tricuspid insufficiency: Secondary | ICD-10-CM

## 2015-03-28 DIAGNOSIS — I4891 Unspecified atrial fibrillation: Secondary | ICD-10-CM | POA: Diagnosis not present

## 2015-03-28 DIAGNOSIS — I2510 Atherosclerotic heart disease of native coronary artery without angina pectoris: Secondary | ICD-10-CM | POA: Insufficient documentation

## 2015-03-28 DIAGNOSIS — I340 Nonrheumatic mitral (valve) insufficiency: Secondary | ICD-10-CM | POA: Diagnosis not present

## 2015-03-28 DIAGNOSIS — I5022 Chronic systolic (congestive) heart failure: Secondary | ICD-10-CM

## 2015-03-28 DIAGNOSIS — I272 Pulmonary hypertension, unspecified: Secondary | ICD-10-CM

## 2015-03-28 DIAGNOSIS — I25119 Atherosclerotic heart disease of native coronary artery with unspecified angina pectoris: Secondary | ICD-10-CM

## 2015-03-28 NOTE — Patient Instructions (Signed)
The patient should continue all previous medications without changes at this time  

## 2015-03-28 NOTE — Progress Notes (Signed)
[address]       [place] [address]             [phone]     CARDIOTHORACIC SURGERY OFFICE NOTE  Referring Provider is Wellington Hampshire, MD PCP is Ezequiel Kayser, MD   HPI:  Patient returns to the office today for follow-up of stage D severe symptomatic mitral regurgitation, tricuspid regurgitation, and atrial fibrillation. She was originally seen in consultation on 03/09/2015. Since then she underwent follow-up pulmonary function tests and CT angiography. During the interim period time we were able to obtain films from the patient's previous diagnostic cardiac catheterization and transesophageal echocardiogram. She returns to the office today to review the results of these tests and discuss treatment options further.  The patient states that she has remained essentially stable over the last few weeks, although she states that she has now been developing more frequent episodes of substernal chest pressure and shortness of breath with exertion. She denies any symptoms occurring at rest or with minimal activity, and symptoms are always relieved by stopping to rest. However, symptoms have become more frequent and occur with less strenuous activity. She has not developed PND, orthopnea, or lower extremity edema.   Current Outpatient Prescriptions  Medication Sig Dispense Refill  . apixaban (ELIQUIS) 5 MG TABS tablet Take 1 tablet (5 mg total) by mouth 2 (two) times daily. 60 tablet 6  . benzonatate (TESSALON) 100 MG capsule Take 100 mg by mouth 3 (three) times daily as needed for cough.    . bisoprolol (ZEBETA) 10 MG tablet Take 1 tablet (10 mg total) by mouth daily. 30 tablet 6  . fluticasone (FLONASE) 50 MCG/ACT nasal spray Place into both nostrils as needed.     . furosemide (LASIX) 20 MG tablet Take 20 mg by mouth 2 (two) times daily.     . Indacaterol Maleate 75 MCG CAPS Place into inhaler and inhale daily.    Marland Kitchen ipratropium-albuterol (DUONEB) 0.5-2.5 (3)  MG/3ML SOLN Take 3 mLs by nebulization as needed.     Marland Kitchen levothyroxine (SYNTHROID, LEVOTHROID) 88 MCG tablet Take 88 mcg by mouth daily before breakfast.    . losartan (COZAAR) 100 MG tablet Take 100 mg by mouth daily.    . Multiple Vitamins-Minerals (PRESERVISION AREDS PO) Take by mouth.    . pantoprazole (PROTONIX) 40 MG tablet Take 40 mg by mouth daily.    Marland Kitchen spironolactone (ALDACTONE) 25 MG tablet Take 1 tablet (25 mg total) by mouth daily. 30 tablet 6   No current facility-administered medications for this visit.      Physical Exam:   BP 154/74 mmHg  Pulse 62  Resp 20  Ht 5' 1.5" (1.562 m)  Wt 143 lb (64.864 kg)  BMI 26.59 kg/m2  SpO2 90%  General:  Elderly and slightly frail but otherwise looks good  Chest:   clear  CV:   RRR w/ soft systolic murmur  Incisions:  n/a  Abdomen:  Soft, non-distended, non-tender  Extremities:  Warm, well-perfused   Diagnostic Tests:  CARDIAC CATHETERIZATION  Films from diagnostic cardiac catheterization performed 10/28/2014 are personally reviewed.  The patient is noted to have severe coronary artery calcification with diffuse nonobstructive coronary artery disease. However, in several views there appeared to be potentially significant stenosis of the proximal left anterior descending coronary artery, particularly involving trifurcation with a diagonal branch. I feel that there may be hemodynamically significant stenosis in this particular area, potentially 50-70%. The left circumflex  coronary artery is quite small. There is right dominant coronary circulation. There is severe calcification throughout the right coronary artery although there does not appear to be any significant flow limiting disease. There was 30% stenosis of the right coronary artery and 40-50% stenosis of the ostial portion of the posterior descending coronary artery.Left ventricular function was moderately reduced with ejection fraction estimated 40%.  Pulmonary artery pressures  were reported 50/28 mmHg with mean pulmonary capillary wedge pressure 26 mmHg and large V waves of 49 mmHg.  Mean central venous pressure was 18 mmHg.   TRANSESOPHAGEAL ECHOCARDIOGRAM  Films from transesophageal echocardiogram performed at the time of DC cardioversion on 11/24/2014 are personally reviewed. There was severe mitral regurgitation. The jet of regurgitation was eccentric and directed posteriorly. There was malcoaptation with an overriding anterior leaflet over a restricted posterior leaflet. There was mild sclerosis of both the anterior and posterior mitral leaflets, but they did not appear severely thickened or suggestive of underlying rheumatic disease. The posterior leaflet was somewhat short and severely restricted during both systole and diastole. There was some shadowing over the subvalvular apparatus to the posterior leaflet, but no definite thickening or foreshortening of cords to suggest rheumatic disease. There was some calcification in the posterior annulus present. There was at least moderate tricuspid regurgitation. There was mild aortic valve sclerosis with no aortic stenosis and no significant aortic insufficiency. Left ventricular function was moderately reduced with ejection fraction estimated 35-40%.   CT ANGIOGRAPHY CHEST, ABDOMEN AND PELVIS  TECHNIQUE: Multidetector CT imaging through the chest, abdomen and pelvis was performed using the standard protocol during bolus administration of intravenous contrast. Multiplanar reconstructed images and MIPs were obtained and reviewed to evaluate the vascular anatomy.  CONTRAST: 75 mL Isovue 370  COMPARISON: None.  FINDINGS: CTA CHEST FINDINGS  The aortic root near the sinuses of Valsalva roughly measure 3.0 cm. There are prominent calcifications involving the proximal LAD and proximal left circumflex artery. Calcifications in the right coronary artery. The mid ascending thoracic aorta measures up to 3.6 cm  without aneurysm. Normal aortic arch configuration. Great vessels are patent. There is no significant plaque or calcifications involving the ascending thoracic aorta but there is calcified plaque at the aortic arch and mild mixed plaque in the descending thoracic aorta. The aortic arch measures 2.8 cm in diameter. The descending thoracic aorta measures 2.7 cm. Left atrium is prominent measuring 5.6 cm in the AP dimension. Heart size is enlarged. No significant pericardial or pleural fluid. Subcarinal calcification compatible with old granulomatous disease. There is mild hilar lymphadenopathy, best demonstrated on the right side on sequence 4, image 711. Right hilar tissue measures up to the 0.9 cm in the short axis.  The trachea and mainstem bronchi are patent. There is diffuse centrilobular emphysema, particularly in the upper lobes. There is a large pleural-based calcification in the right upper lobe measuring 1.2 cm. There is mild volume loss at the base of the lingula. No large areas of airspace disease. There is a subtle 4 mm nodule along the anterior left upper lobe on sequence 5, image 28 which is nonspecific. No acute bone abnormality.  Review of the MIP images confirms the above findings.  CTA ABDOMEN AND PELVIS FINDINGS  Atherosclerotic plaque throughout the abdominal aorta without aneurysm. Mild narrowing at the origin of the celiac trunk. The main branches of the celiac trunk are patent. There is typical common hepatic artery anatomy. The SMA is patent with mild plaque but no significant stenosis. IMA is  patent. Main left renal artery is patent without significant stenosis. There is an accessory superior left renal artery which is patent. Main right renal artery has mild narrowing at the origin. There is a patent accessory inferior right renal artery.  Mild mixed plaque in the iliac arteries but no critical stenosis. Right common femoral artery is patent with mild  calcific plaque. The proximal right femoral arteries are patent. Left common femoral artery has mild calcific plaque but no significant stenosis. The proximal left femoral arteries are patent.  IVC and renal veins are patent. Portal venous system appears to be patent. There are a few hyperdense or enhancing foci in the liver, the largest area measures roughly 1.4 cm near the hepatic dome. The right hepatic lesions are less conspicuous on the delayed images. No significant mass effect from these areas of arterial enhancement. No gross abnormality to the spleen, pancreas, stomach and duodenum. Calcifications in the spleen are compatible with old granulomatous disease. Mild nodularity and thickening of left adrenal gland could represent hyperplasia but nonspecific. Normal appearance of both kidneys with probable small cyst in the left kidney interpolar region. No significant free fluid or lymphadenopathy. No acute bone abnormality. No gross abnormality to the urinary bladder. The uterus is absent. Diverticulosis in the sigmoid colon without acute colonic inflammation. No acute abnormality in the small or large bowel.  Review of the MIP images confirms the above findings.  IMPRESSION: Mild atherosclerotic disease involving the aorta and iliac arteries without significant stenosis and no aneurysm.  Cardiomegaly with enlargement of the left atrium. Findings are compatible with mitral valve disease.  Emphysematous changes. Indeterminate 4 mm nodule in left upper lobe. Given risk factors for bronchogenic carcinoma, follow-up chest CT at 1 year is recommended. This recommendation follows the consensus statement: Guidelines for Management of Small Pulmonary Nodules Detected on CT Scans: A Statement from the [place] as published in Radiology 2005; 237:395-400.  Evidence for old granulomatous disease. Mild hilar lymphadenopathy is nonspecific.  Coronary artery  calcifications.  Scattered hyperdense or enhancing foci in the liver are indeterminate. Some of these could represent transient hepatic attenuation differences (THAD) but cannot exclude small hypervascular lesions. Suspect these are incidental findings unless the patient has a history or risk for malignancy. These could be more definitively characterized with MRI if needed.   Electronically Signed  By: Markus Daft M.D.  On: 03/17/2015 12:23   Pulmonary Function Tests  Baseline      Post-bronchodilator  FVC  1.92 L  (80% predicted) FVC  1.86 L  (77% predicted) FEV1  1.20 L  (67% predicted) FEV1  1.13 L  (63% predicted) FEF25-75 0.57 L  (41% predicted) FEF25-75 0.47 L  (34% predicted)  TLC  4.06 L  (86% predicted) RV  2.15 L  (96% predicted) DLCO  35% predicted    Impression:  Patient has stage D severe symptomatic primary mitral regurgitation. She presents with a one-year history of progressive symptoms of exertional shortness of breath consistent with combined systolic and diastolic congestive heart failure. She presented acutely in February with presumably new onset persistent atrial fibrillation. She underwent successful DC cardioversion in March and has remained clinically stable in sinus rhythm since that time. However, she continues to experience symptoms of exertional shortness of breath consistent with chronic combined systolic and diastolic congestive heart failure, [place] Heart Association functional class IIB. Today the patient tells me that symptoms have progressed over the last few weeks, and she now is experiencing more pronounced symptoms  of substernal chest pressure with exertion suggestive of angina pectoris. Symptoms are always relieved by rest. I have personally reviewed the diagnostic cardiac catheterization performed last February. The patient has diffuse nonobstructive coronary artery disease with severe coronary artery calcification. In addition, she  has an area of stenosis in the proximal left anterior descending coronary artery that in some views appears potentially hemodynamically significant.  I have also reviewed the patient's transesophageal echocardiogram performed this past March at the time of the cardioversion.   Images of the valve are much better than those seen on recent transesophageal echocardiogram and confirmed the presence of severe mitral regurgitation. There is severe restriction of the posterior leaflet, but this may be related to degenerative disease with calcification of the annulus and posterior leaflet. The jet of regurgitation is clearly somewhat eccentric and directed posteriorly. There are no obvious areas of leaflet prolapse but there is malcoaptation with the anterior leaflet overriding the restricted posterior leaflet. The patient does have a reported history of scarlet fever as a child and long history of heart murmur and it remains possible that her valve dysfunction may be related to underlying rheumatic heart disease.  However, after reviewing the transesophageal echocardiogram I am a bit more optimistic that the valve may still be repairable.  Left ventricular systolic function appears moderately reduced with ejection fraction estimated 35-40%, although the ejection fraction appeared somewhat better on the more recent transthoracic echocardiogram at which time the patient was maintaining sinus rhythm.  CT angiography of the abdomen and pelvis demonstrates diffuse calcification and moderate nonobstructive atherosclerotic plaque involving the abdominal aorta and iliac vessels.   Under the circumstances, risks associated with femoral artery cannulation to facilitate minimally invasive approach for surgery might be somewhat elevated. Moreover, because of the patient's symptoms of angina, I am reluctant to proceed with surgery without concomitant revascularization of the left anterior descending coronary artery.  As a result, I  favor proceeding with surgery via conventional median sternotomy with concomitant single vessel coronary artery bypass grafting to the left anterior descending coronary artery.  Repeat pulmonary function tests confirm the presence of mild to moderate obstructive lung disease and moderate baseline reduction in diffusion opacity.     Plan:  The patient and her husband were counseled at length regarding the indications, risks and potential benefits of mitral valve repair or replacement and tricuspid valve repair.  The rationale for elective surgery has been explained, including a comparison between surgery and continued medical therapy with close follow-up.  The likelihood of successful and durable valve repair has been discussed with particular reference to the findings of their recent echocardiogram.  Based upon these findings and previous experience, I have quoted them a greater than 50 percent likelihood of successful valve repair.  In the unlikely event that their valve cannot be successfully repaired, we discussed the possibility of replacing the mitral valve using a mechanical prosthesis with the attendant need for long-term anticoagulation versus the alternative of replacing it using a bioprosthetic tissue valve with its potential for late structural valve deterioration and failure, depending upon the patient's longevity.  The patient specifically requests that if the mitral valve must be replaced that it be done using a bioprosthetic tissue valve.   The patient understands and accepts all potential risks of surgery including but not limited to risk of death, stroke or other neurologic complication, myocardial infarction, congestive heart failure, respiratory failure, renal failure, bleeding requiring transfusion and/or reexploration, arrhythmia, infection or other wound complications, pneumonia, pleural and/or  pericardial effusion, pulmonary embolus, aortic dissection or other major vascular  complication, or delayed complications related to valve repair or replacement including but not limited to structural valve deterioration and failure, thrombosis, embolization, endocarditis, or paravalvular leak.  The relative risks and benefits of performing a maze procedure at the time of their surgery was discussed at length, including the expected likelihood of long term freedom from recurrent symptomatic atrial fibrillation and/or atrial flutter.  We also discussed the rationale for surgical revascularization of the left anterior descending coronary artery.  All of their questions have been addressed. We tentatively plan to proceed with surgery on Tuesday, 04/25/2015. The patient will return for follow-up on Monday, 04/17/2015. At that time we will discuss the timing of surgery and when the patient should stop taking Eliquis.  We will plan to start her on amiodarone at that time. All of their questions have been addressed.    I spent in excess of 30 minutes during the conduct of this office consultation and >50% of this time involved direct face-to-face encounter with the patient for counseling and/or coordination of their care.   Valentina Gu. Roxy Manns, MD 03/28/2015 5:42 PM

## 2015-03-29 ENCOUNTER — Other Ambulatory Visit: Payer: Self-pay

## 2015-03-29 DIAGNOSIS — I071 Rheumatic tricuspid insufficiency: Secondary | ICD-10-CM

## 2015-03-29 DIAGNOSIS — I340 Nonrheumatic mitral (valve) insufficiency: Secondary | ICD-10-CM

## 2015-03-29 DIAGNOSIS — I25799 Atherosclerosis of other coronary artery bypass graft(s) with unspecified angina pectoris: Secondary | ICD-10-CM

## 2015-03-29 DIAGNOSIS — I4819 Other persistent atrial fibrillation: Secondary | ICD-10-CM

## 2015-04-17 ENCOUNTER — Encounter: Payer: Self-pay | Admitting: Thoracic Surgery (Cardiothoracic Vascular Surgery)

## 2015-04-17 ENCOUNTER — Ambulatory Visit (INDEPENDENT_AMBULATORY_CARE_PROVIDER_SITE_OTHER): Payer: Medicare PPO | Admitting: Thoracic Surgery (Cardiothoracic Vascular Surgery)

## 2015-04-17 VITALS — BP 170/64 | HR 52 | Resp 20 | Ht 61.5 in | Wt 143.0 lb

## 2015-04-17 DIAGNOSIS — I071 Rheumatic tricuspid insufficiency: Secondary | ICD-10-CM | POA: Diagnosis not present

## 2015-04-17 DIAGNOSIS — I4891 Unspecified atrial fibrillation: Secondary | ICD-10-CM | POA: Diagnosis not present

## 2015-04-17 DIAGNOSIS — I25119 Atherosclerotic heart disease of native coronary artery with unspecified angina pectoris: Secondary | ICD-10-CM

## 2015-04-17 DIAGNOSIS — I340 Nonrheumatic mitral (valve) insufficiency: Secondary | ICD-10-CM

## 2015-04-17 DIAGNOSIS — I5022 Chronic systolic (congestive) heart failure: Secondary | ICD-10-CM

## 2015-04-17 DIAGNOSIS — I5042 Chronic combined systolic (congestive) and diastolic (congestive) heart failure: Secondary | ICD-10-CM

## 2015-04-17 MED ORDER — AMIODARONE HCL 200 MG PO TABS: 200.0000 mg | ORAL_TABLET | Freq: Two times a day (BID) | ORAL | Status: DC

## 2015-04-17 NOTE — Progress Notes (Signed)
[address]       [place] [address]             [phone]     CARDIOTHORACIC SURGERY OFFICE NOTE  Referring Provider is Wellington Hampshire, MD PCP is Ezequiel Kayser, MD   HPI:  Patient return to the office today for follow-up of stage D severe symptomatic mitral regurgitation, tricuspid regurgitation, atrial fibrillation, and coronary artery disease. She was originally seen in consultation on 03/09/2015 and she has been seen more recently on 03/28/2015. She returns to the office today with tentative plans to proceed with elective surgery next week. She reports no new problems or complaints over the last few weeks.   Current Outpatient Prescriptions  Medication Sig Dispense Refill  . apixaban (ELIQUIS) 5 MG TABS tablet Take 1 tablet (5 mg total) by mouth 2 (two) times daily. 60 tablet 6  . benzonatate (TESSALON) 100 MG capsule Take 100 mg by mouth 3 (three) times daily as needed for cough.    . bisoprolol (ZEBETA) 10 MG tablet Take 1 tablet (10 mg total) by mouth daily. 30 tablet 6  . fluticasone (FLONASE) 50 MCG/ACT nasal spray Place into both nostrils as needed.     . furosemide (LASIX) 20 MG tablet Take 20 mg by mouth 2 (two) times daily.     . Indacaterol Maleate 75 MCG CAPS Place into inhaler and inhale daily.    Marland Kitchen ipratropium-albuterol (DUONEB) 0.5-2.5 (3) MG/3ML SOLN Take 3 mLs by nebulization as needed.     Marland Kitchen levothyroxine (SYNTHROID, LEVOTHROID) 88 MCG tablet Take 88 mcg by mouth daily before breakfast.    . losartan (COZAAR) 100 MG tablet Take 100 mg by mouth daily.    . Multiple Vitamins-Minerals (PRESERVISION AREDS PO) Take by mouth.    . pantoprazole (PROTONIX) 40 MG tablet Take 40 mg by mouth daily.    Marland Kitchen spironolactone (ALDACTONE) 25 MG tablet Take 1 tablet (25 mg total) by mouth daily. 30 tablet 6  . amiodarone (PACERONE) 200 MG tablet Take 1 tablet (200 mg total) by mouth 2 (two) times daily. 60 tablet 0   No current facility-administered  medications for this visit.      Physical Exam:   BP 170/64 mmHg  Pulse 52  Resp 20  Ht 5' 1.5" (1.562 m)  Wt 143 lb (64.864 kg)  BMI 26.59 kg/m2  SpO2 91%  General:  Well-appearing  Chest:   clear  CV:   Regular rate and rhythm with holosystolic murmur heard best at the axilla  Incisions:  n/a  Abdomen:  Soft and nontender  Extremities:  Warm and well-perfused  Diagnostic Tests:  n/a   Impression:  Patient has stage D severe symptomatic primary mitral regurgitation. She presents with a one-year history of progressive symptoms of exertional shortness of breath consistent with combined systolic and diastolic congestive heart failure. She presented acutely in February with presumably new onset persistent atrial fibrillation. She underwent successful DC cardioversion in March and has remained clinically stable in sinus rhythm since that time. However, she continues to experience symptoms of exertional shortness of breath consistent with chronic combined systolic and diastolic congestive heart failure, [place] Heart Association functional class IIB. Today the patient tells me that symptoms have progressed over the last few weeks, and she now is experiencing more pronounced symptoms of substernal chest pressure with exertion suggestive of angina pectoris. Symptoms are always relieved by rest. I have personally reviewed the diagnostic cardiac catheterization  performed last February. The patient has diffuse nonobstructive coronary artery disease with severe coronary artery calcification. In addition, she has an area of stenosis in the proximal left anterior descending coronary artery that in some views appears potentially hemodynamically significant. I have also reviewed the patient's transesophageal echocardiogram performed this past March at the time of the cardioversion. Images of the valve are much better than those seen on recent transesophageal echocardiogram and confirmed the  presence of severe mitral regurgitation. There is severe restriction of the posterior leaflet, but this may be related to degenerative disease with calcification of the annulus and posterior leaflet. The jet of regurgitation is clearly somewhat eccentric and directed posteriorly. There are no obvious areas of leaflet prolapse but there is malcoaptation with the anterior leaflet overriding the restricted posterior leaflet. The patient does have a reported history of scarlet fever as a child and long history of heart murmur and it remains possible that her valve dysfunction may be related to underlying rheumatic heart disease. However, after reviewing the transesophageal echocardiogram I am a bit more optimistic that the valve may still be repairable. Left ventricular systolic function appears moderately reduced with ejection fraction estimated 35-40%, although the ejection fraction appeared somewhat better on the more recent transthoracic echocardiogram at which time the patient was maintaining sinus rhythm. CT angiography of the abdomen and pelvis demonstrates diffuse calcification and moderate nonobstructive atherosclerotic plaque involving the abdominal aorta and iliac vessels. Under the circumstances, risks associated with femoral artery cannulation to facilitate minimally invasive approach for surgery might be somewhat elevated. Moreover, because of the patient's symptoms of angina, I am reluctant to proceed with surgery without concomitant revascularization of the left anterior descending coronary artery. As a result, I favor proceeding with surgery via conventional median sternotomy with concomitant single vessel coronary artery bypass grafting to the left anterior descending coronary artery. Repeat pulmonary function tests confirm the presence of mild to moderate obstructive lung disease and moderate baseline reduction in diffusion opacity.   Plan:  The patient and her husband were again counseled  at length regarding the indications, risks and potential benefits of mitral valve repair or replacement and tricuspid valve repair. The rationale for elective surgery has been explained, including a comparison between surgery and continued medical therapy with close follow-up. The likelihood of successful and durable valve repair has been discussed with particular reference to the findings of their recent echocardiogram. Based upon these findings and previous experience, I have quoted them a greater than 50 percent likelihood of successful valve repair. In the unlikely event that their valve cannot be successfully repaired, we discussed the possibility of replacing the mitral valve using a mechanical prosthesis with the attendant need for long-term anticoagulation versus the alternative of replacing it using a bioprosthetic tissue valve with its potential for late structural valve deterioration and failure, depending upon the patient's longevity. The patient specifically requests that if the mitral valve must be replaced that it be done using a bioprosthetic tissue valve. The patient understands and accepts all potential risks of surgery including but not limited to risk of death, stroke or other neurologic complication, myocardial infarction, congestive heart failure, respiratory failure, renal failure, bleeding requiring transfusion and/or reexploration, arrhythmia, infection or other wound complications, pneumonia, pleural and/or pericardial effusion, pulmonary embolus, aortic dissection or other major vascular complication, or delayed complications related to valve repair or replacement including but not limited to structural valve deterioration and failure, thrombosis, embolization, endocarditis, or paravalvular leak. The relative risks and benefits of  performing a maze procedure at the time of their surgery was discussed at length, including the expected likelihood of long term freedom from recurrent  symptomatic atrial fibrillation and/or atrial flutter. We also discussed the rationale for surgical revascularization of the left anterior descending coronary artery.  We plan to proceed with surgery on Tuesday, 04/25/2015.  The patient has been instructed to stop taking Eliquis.  In addition, I have given the patient a prescription for amiodarone to begin tomorrow. On the morning of surgery the patient has been instructed to take only her Synthroid, Protonix, bisoprolol, and one half of her regular scheduled dose of losartan with a sip of water.  All of their questions have been addressed.    I spent in excess of 15 minutes during the conduct of this office consultation and >50% of this time involved direct face-to-face encounter with the patient for counseling and/or coordination of their care.   Valentina Gu. Roxy Manns, MD 04/17/2015 1:23 PM

## 2015-04-17 NOTE — Patient Instructions (Addendum)
Patient has been instructed to stop taking Eliquis now and begin taking Amiodarone  Patient should continue taking all other medications without change through the day before surgery.  Patient should have nothing to eat or drink after midnight the night before surgery.  On the morning of surgery patient should take only Synthroid, Protonix, bisoprolol, and 1/2 dose of losartan (Cozaar) with a sip of water.

## 2015-04-20 NOTE — Pre-Procedure Instructions (Signed)
    CHANTRELL APSEY  04/20/2015      [id number] - [place], [place] - [address] [place] [address] Phone: [phone] Fax: [phone]    Your procedure is scheduled on Tuesday Aug. 16  Report to [facility] at 5:30 A.M.  Call this number if you have problems the morning of surgery:  [phone]               Any questions call [phone] (monday- Friday 8 am-4 pm)    Remember:  Do not eat food or drink liquids after midnight Monday Aug. 15   Take these medicines the morning of surgery with A SIP OF WATER amiodarone (pacerone), bisoprolol (zebeta), flonase nasal spray if needed, duoneb if needed, levothyroxine (synthroid), protonix (pantoprazole), indacaterol maleate (inhaler),               Stop non steroidal anti-inflammatory medications today: advil, ibuprofen, aleve    Do not wear jewelry, make-up or nail polish.  Do not wear lotions, powders, or perfumes.  You may wear deodorant.  Do not shave 48 hours prior to surgery.  Men may shave face and neck.  Do not bring valuables to the hospital.  [facility] is not responsible for any belongings or valuables.  Contacts, dentures or bridgework may not be worn into surgery.  Leave your suitcase in the car.  After surgery it may be brought to your room.  For patients admitted to the hospital, discharge time will be determined by your treatment team.  Patients discharged the day of surgery will not be allowed to drive home.   Name and phone number of your driver:    Special instructions: review preparing for surgery  Please read over the following fact sheets that you were given. Pain Booklet, Coughing and Deep Breathing, Blood Transfusion Information, Open Heart Packet, MRSA Information and Surgical Site Infection Prevention

## 2015-04-21 ENCOUNTER — Encounter (HOSPITAL_COMMUNITY): Payer: Self-pay

## 2015-04-21 ENCOUNTER — Encounter (HOSPITAL_COMMUNITY)
Admission: RE | Admit: 2015-04-21 | Discharge: 2015-04-21 | Disposition: A | Discharge status: Home / Self Care | Payer: Medicare PPO | Source: Ambulatory Visit | Attending: Thoracic Surgery (Cardiothoracic Vascular Surgery) | Admitting: Thoracic Surgery (Cardiothoracic Vascular Surgery)

## 2015-04-21 ENCOUNTER — Ambulatory Visit (HOSPITAL_COMMUNITY)
Admission: RE | Admit: 2015-04-21 | Discharge: 2015-04-21 | Disposition: A | Discharge status: Home / Self Care | Payer: Medicare PPO | Source: Ambulatory Visit | Attending: Thoracic Surgery (Cardiothoracic Vascular Surgery) | Admitting: Thoracic Surgery (Cardiothoracic Vascular Surgery)

## 2015-04-21 VITALS — BP 157/46 | HR 57 | Temp 98.0°F | Resp 20 | Ht 62.0 in | Wt 145.2 lb

## 2015-04-21 DIAGNOSIS — I481 Persistent atrial fibrillation: Secondary | ICD-10-CM | POA: Insufficient documentation

## 2015-04-21 DIAGNOSIS — Z01810 Encounter for preprocedural cardiovascular examination: Secondary | ICD-10-CM | POA: Insufficient documentation

## 2015-04-21 DIAGNOSIS — I25799 Atherosclerosis of other coronary artery bypass graft(s) with unspecified angina pectoris: Secondary | ICD-10-CM

## 2015-04-21 DIAGNOSIS — I340 Nonrheumatic mitral (valve) insufficiency: Secondary | ICD-10-CM | POA: Diagnosis not present

## 2015-04-21 DIAGNOSIS — R918 Other nonspecific abnormal finding of lung field: Secondary | ICD-10-CM | POA: Diagnosis not present

## 2015-04-21 DIAGNOSIS — Z01818 Encounter for other preprocedural examination: Secondary | ICD-10-CM | POA: Diagnosis not present

## 2015-04-21 DIAGNOSIS — I071 Rheumatic tricuspid insufficiency: Secondary | ICD-10-CM

## 2015-04-21 DIAGNOSIS — Z0183 Encounter for blood typing: Secondary | ICD-10-CM | POA: Insufficient documentation

## 2015-04-21 DIAGNOSIS — I517 Cardiomegaly: Secondary | ICD-10-CM | POA: Insufficient documentation

## 2015-04-21 DIAGNOSIS — I4819 Other persistent atrial fibrillation: Secondary | ICD-10-CM

## 2015-04-21 DIAGNOSIS — Z01812 Encounter for preprocedural laboratory examination: Secondary | ICD-10-CM | POA: Insufficient documentation

## 2015-04-21 HISTORY — DX: Gastro-esophageal reflux disease without esophagitis: K21.9

## 2015-04-21 HISTORY — DX: Anxiety disorder, unspecified: F41.9

## 2015-04-21 HISTORY — DX: Other specified postprocedural states: Z98.890

## 2015-04-21 HISTORY — DX: Other specified postprocedural states: R11.2

## 2015-04-21 HISTORY — DX: Nausea with vomiting, unspecified: R11.2

## 2015-04-21 HISTORY — DX: Unspecified osteoarthritis, unspecified site: M19.90

## 2015-04-21 LAB — COMPREHENSIVE METABOLIC PANEL
ALBUMIN: 4 g/dL (ref 3.5–5.0)
ALK PHOS: 69 U/L (ref 38–126)
ALT: 23 U/L (ref 14–54)
AST: 25 U/L (ref 15–41)
Anion gap: 11 (ref 5–15)
BUN: 17 mg/dL (ref 6–20)
CO2: 23 mmol/L (ref 22–32)
Calcium: 9.5 mg/dL (ref 8.9–10.3)
Chloride: 107 mmol/L (ref 101–111)
Creatinine, Ser: 0.94 mg/dL (ref 0.44–1.00)
GFR calc Af Amer: >60 mL/min (ref >60)
GFR calc non Af Amer: 57 mL/min — ABNORMAL LOW (ref >60)
Glucose, Bld: 151 mg/dL — ABNORMAL HIGH (ref 65–99)
Potassium: 4.1 mmol/L (ref 3.5–5.1)
SODIUM: 141 mmol/L (ref 135–145)
TOTAL PROTEIN: 7.2 g/dL (ref 6.5–8.1)
Total Bilirubin: 0.8 mg/dL (ref 0.3–1.2)

## 2015-04-21 LAB — BLOOD GAS, ARTERIAL
ACID-BASE EXCESS: 2.8 mmol/L — AB (ref 0.0–2.0)
Bicarbonate: 26.5 mEq/L — ABNORMAL HIGH (ref 20.0–24.0)
FIO2: 0.21
O2 Saturation: 90.2 %
PCO2 ART: 38.3 mmHg (ref 35.0–45.0)
PH ART: 7.454 — AB (ref 7.350–7.450)
Patient temperature: 98.6
TCO2: 27.6 mmol/L (ref 0–100)
pO2, Arterial: 59.3 mmHg — ABNORMAL LOW (ref 80.0–100.0)

## 2015-04-21 LAB — CBC
HCT: 38.1 % (ref 36.0–46.0)
Hemoglobin: 12.2 g/dL (ref 12.0–15.0)
MCH: 31.2 pg (ref 26.0–34.0)
MCHC: 32 g/dL (ref 30.0–36.0)
MCV: 97.4 fL (ref 78.0–100.0)
Platelets: 187 10*3/uL (ref 150–400)
RBC: 3.91 MIL/uL (ref 3.87–5.11)
RDW: 14.9 % (ref 11.5–15.5)
WBC: 8.4 10*3/uL (ref 4.0–10.5)

## 2015-04-21 LAB — URINALYSIS, ROUTINE W REFLEX MICROSCOPIC
Bilirubin Urine: NEGATIVE
Glucose, UA: NEGATIVE mg/dL
Hgb urine dipstick: NEGATIVE
KETONES UR: NEGATIVE mg/dL
Nitrite: NEGATIVE
Protein, ur: NEGATIVE mg/dL
Specific Gravity, Urine: 1.018 (ref 1.005–1.030)
UROBILINOGEN UA: 0.2 mg/dL (ref 0.0–1.0)
pH: 6.5 (ref 5.0–8.0)

## 2015-04-21 LAB — SURGICAL PCR SCREEN
MRSA, PCR: NEGATIVE
STAPHYLOCOCCUS AUREUS: NEGATIVE

## 2015-04-21 LAB — APTT: aPTT: 32 seconds (ref 24–37)

## 2015-04-21 LAB — URINE MICROSCOPIC-ADD ON

## 2015-04-21 LAB — PROTIME-INR
INR: 1.11 (ref 0.00–1.49)
Prothrombin Time: 14.5 seconds (ref 11.6–15.2)

## 2015-04-21 LAB — ABO/RH: ABO/RH(D): O POS

## 2015-04-21 NOTE — Progress Notes (Addendum)
Cardiologist: Dr. Fletcher Anon in [facility] Vel[facility] Heckler) Pulmologist: Dr.Flemming  [facility] previously called [facility]   Pt. Is on oxygen 2 liters 24 hrs. She will take it off if not needed at times.

## 2015-04-21 NOTE — Anesthesia Preprocedure Evaluation (Addendum)
Anesthesia Evaluation  Patient identified by MRN, date of birth, ID band Patient awake    Reviewed: Allergy & Precautions, NPO status , Patient's Chart, lab work & pertinent test results, reviewed documented beta blocker date and time   History of Anesthesia Complications (+) PONV  Airway Mallampati: II  TM Distance: >3 FB Neck ROM: Full    Dental  (+) Dental Advisory Given   Pulmonary shortness of breath, COPDformer smoker,  breath sounds clear to auscultation        Cardiovascular hypertension, Pt. on medications and Pt. on home beta blockers + CAD and +CHF + Valvular Problems/Murmurs (TR) MR Rhythm:Regular Rate:Normal     Neuro/Psych negative neurological ROS     GI/Hepatic Neg liver ROS, GERD-  ,  Endo/Other  Hypothyroidism   Renal/GU negative Renal ROS     Musculoskeletal   Abdominal   Peds  Hematology negative hematology ROS (+)   Anesthesia Other Findings   Reproductive/Obstetrics                          Lab Results  Component Value Date   WBC 8.4 04/21/2015   HGB 12.2 04/21/2015   HCT 38.1 04/21/2015   MCV 97.4 04/21/2015   PLT 187 04/21/2015   Lab Results  Component Value Date   CREATININE 0.94 04/21/2015   BUN 17 04/21/2015   NA 141 04/21/2015   K 4.1 04/21/2015   CL 107 04/21/2015   CO2 23 04/21/2015    Anesthesia Physical Anesthesia Plan  ASA: IV  Anesthesia Plan: General   Post-op Pain Management:    Induction: Intravenous  Airway Management Planned: Oral ETT  Additional Equipment: Arterial line, PA Cath, 3D TEE, CVP and Ultrasound Guidance Line Placement  Intra-op Plan:   Post-operative Plan: Post-operative intubation/ventilation  Informed Consent: I have reviewed the patients History and Physical, chart, labs and discussed the procedure including the risks, benefits and alternatives for the proposed anesthesia with the patient or authorized  representative who has indicated his/her understanding and acceptance.   Dental advisory given  Plan Discussed with: CRNA  Anesthesia Plan Comments:        Anesthesia Quick Evaluation

## 2015-04-21 NOTE — Progress Notes (Signed)
VASCULAR LAB PRELIMINARY  PRELIMINARY  PRELIMINARY  PRELIMINARY  Pre-op Cardiac Surgery  Carotid Findings:  Bilateral:  1-39% ICA stenosis.  Vertebral artery flow is antegrade.     Upper Extremity Right Left  Brachial Pressures 172 Triphasic 167 Triphasic  Radial Waveforms Triphasic Triphasic  Ulnar Waveforms Triphasic Triphasic  Palmar Arch (Allen's Test) Normal Normal   Findings:  Doppler waveforms remained normal bilaterally with both radial and ulnar compressions    Lower  Extremity Right Left  Dorsalis Pedis 155 Triphasic 145 Triphasic  Posterior Tibial 158 [place] 191 Triphasic  Ankle/Brachial Indices 0.91 1.10    Findings:  ABIs on the right indicate a mild reduction ina arterial flow at rest. Left ABIs indicate normal arterial flow at rest.   Kagan Hietpas, RVS 04/21/2015, 4:08 PM

## 2015-04-22 LAB — HEMOGLOBIN A1C
Hgb A1c MFr Bld: 6.3 % — ABNORMAL HIGH (ref 4.8–5.6)
MEAN PLASMA GLUCOSE: 134 mg/dL

## 2015-04-24 ENCOUNTER — Ambulatory Visit: Payer: Medicare PPO | Admitting: Thoracic Surgery (Cardiothoracic Vascular Surgery)

## 2015-04-24 MED ORDER — SODIUM CHLORIDE 0.9 % IV SOLN
INTRAVENOUS | Status: AC
  Administered 2015-04-25: 69.8 mL/h via INTRAVENOUS
  Administered 2015-04-25: 13:00:00 via INTRAVENOUS
  Filled 2015-04-24: qty 40

## 2015-04-24 MED ORDER — DOPAMINE-DEXTROSE 3.2-5 MG/ML-% IV SOLN
0.0000 ug/kg/min | INTRAVENOUS | Status: AC
Start: 2015-04-25 — End: 2015-04-25
  Administered 2015-04-25: 3 ug/kg/min via INTRAVENOUS
  Filled 2015-04-24: qty 250

## 2015-04-24 MED ORDER — DEXMEDETOMIDINE HCL IN NACL 400 MCG/100ML IV SOLN
0.1000 ug/kg/h | INTRAVENOUS | Status: AC
  Administered 2015-04-25: .3 ug/kg/h via INTRAVENOUS
  Filled 2015-04-24: qty 100

## 2015-04-24 MED ORDER — NITROGLYCERIN IN D5W 200-5 MCG/ML-% IV SOLN
2.0000 ug/min | INTRAVENOUS | Status: AC
  Administered 2015-04-25: 5 ug/min via INTRAVENOUS
  Filled 2015-04-24: qty 250

## 2015-04-24 MED ORDER — MAGNESIUM SULFATE 50 % IJ SOLN
40.0000 meq | INTRAMUSCULAR | Status: DC
  Filled 2015-04-24: qty 10

## 2015-04-24 MED ORDER — LEVOFLOXACIN IN D5W 500 MG/100ML IV SOLN
500.0000 mg | INTRAVENOUS | Status: AC
  Administered 2015-04-25: 500 mg via INTRAVENOUS
  Filled 2015-04-24: qty 100

## 2015-04-24 MED ORDER — PLASMA-LYTE 148 IV SOLN
INTRAVENOUS | Status: AC
  Administered 2015-04-25: 500 mL
  Filled 2015-04-24: qty 2.5

## 2015-04-24 MED ORDER — SODIUM CHLORIDE 0.9 % IV SOLN
INTRAVENOUS | Status: DC
  Filled 2015-04-24: qty 30

## 2015-04-24 MED ORDER — POTASSIUM CHLORIDE 2 MEQ/ML IV SOLN
80.0000 meq | INTRAVENOUS | Status: DC
Start: 2015-04-25 — End: 2015-04-25
  Filled 2015-04-24: qty 40

## 2015-04-24 MED ORDER — SODIUM CHLORIDE 0.9 % IV SOLN
INTRAVENOUS | Status: AC
  Administered 2015-04-25: 1000 mL
  Filled 2015-04-24: qty 1000

## 2015-04-24 MED ORDER — VANCOMYCIN HCL 10 G IV SOLR
1250.0000 mg | INTRAVENOUS | Status: AC
  Administered 2015-04-25: 1250 mg via INTRAVENOUS
  Filled 2015-04-24: qty 1250

## 2015-04-24 MED ORDER — DEXTROSE 5 % IV SOLN
0.0000 ug/min | INTRAVENOUS | Status: DC
  Filled 2015-04-24: qty 4

## 2015-04-24 MED ORDER — SODIUM CHLORIDE 0.9 % IV SOLN
INTRAVENOUS | Status: AC
  Administered 2015-04-25: 1.4 [IU]/h via INTRAVENOUS
  Filled 2015-04-24: qty 2.5

## 2015-04-24 MED ORDER — METOPROLOL TARTRATE 12.5 MG HALF TABLET: 12.5000 mg | ORAL_TABLET | ORAL | Status: DC

## 2015-04-24 MED ORDER — PHENYLEPHRINE HCL 10 MG/ML IJ SOLN
30.0000 ug/min | INTRAVENOUS | Status: AC
  Administered 2015-04-25: 10 ug/min via INTRAVENOUS
  Filled 2015-04-24: qty 2

## 2015-04-24 NOTE — H&P (Signed)
[address]       [place] [address]             [phone]          CARDIOTHORACIC SURGERY HISTORY AND PHYSICAL EXAM  Referring Provider is Wellington Hampshire, MD PCP is Ezequiel Kayser, MD  Chief Complaint  Patient presents with  . Mitral Regurgitation    Surgical eval, ECHO 01/27/15, Cardaic Cath 10/28/14  . Atrial Fibrillation    HPI:  Patient is a 80 year old female from [facility] with history of hypertension, COPD, hyperlipidemia, and Mnire's disease who more recently has been diagnosed with acute on chronic combined systolic and diastolic congestive heart failure with mitral regurgitation, tricuspid regurgitation, and persistent atrial fibrillation status post DC cardioversion who has now been referred for surgical consultation to discuss options for management of mitral regurgitation, tricuspid regurgitation, and atrial fibrillation. The patient states that she suffered from scarlet fever at age 89 or 12. She was first told that she had a heart murmur at age 56. She never had any formal cardiac evaluation until more recently. She has a long-standing history of tobacco abuse but quit smoking in 1999. She has remained physically active and functional independent until the past year when she began to develop progressive symptoms of exertional shortness of breath. Symptoms became acutely worse in December and were initially attributed to COPD and she was treated with bronchodilator therapy and home oxygen. The patient was hospitalized in February of this year at [facility] with acute systolic congestive heart failure with presumably new-onset atrial fibrillation. Transthoracic echocardiogram performed at that time reportedly demonstrated ejection fraction 35-40% with apical wall hypokinesis and moderate to severe mitral regurgitation. Left and right heart catheterization was performed 10/28/2014 demonstrating mild nonobstructive  coronary artery disease. Left ventricular ejection fraction was estimated 40%. Right heart catheterization revealed moderate pulmonary hypertension with PA pressures measured 50/28 mmHg and mean pulmonary capillary wedge pressure 26 mmHg. There were large V waves consistent with severe mitral regurgitation. Mean central venous pressure was measured 18 mmHg. The patient's atrial fibrillation was treated using Cardizem and bisoprolol for rate control with Eliquis for anticoagulation. One month later the patient underwent TEE guided cardioversion, and she has been maintaining sinus rhythm ever since. The patient has been followed carefully by Dr. Fletcher Anon and has done fairly well on medical therapy. However, the patient continues to experience stable symptoms of exertional shortness of breath consistent with chronic combined systolic and diastolic congestive heart failure, New York Heart Association functional class IIB. Follow-up transthoracic echocardiogram performed 01/27/2015 revealed improved left ventricular systolic function with ejection fraction estimated 50-55%. However, there remained severe mitral regurgitation. The patient was referred for surgical consultation. She was originally seen in consultation on 03/09/2015 and she has been seen more recently on 03/28/2015. She returns to the office today with tentative plans to proceed with elective surgery next week. She reports no new problems or complaints over the last few weeks.  The patient is married and lives locally in [place] with her husband. 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 The patient has remained fairly active physically until the past year. She exercises regularly, including [employer] senior aerobics class that she enjoys. However, over the past year the patient has experienced progressive symptoms of exertional shortness of breath culminating in her acute  exacerbation this past  February. Since the patient underwent DC cardioversion and has been treated with medical therapy including lasix her symptoms have improved. However, she continues to experience exertional shortness of breath with moderate physical activity. This limits her activities to some degree. She does not get short of breath with low-level activities but she does wear oxygen routinely at home both for sleeping and daily activities. She denies recent symptoms of PND, orthopnea, dizzy spells, or syncope. She has some mild lower extremity edema that she controls with diuretics. She developed some substernal chest pressure with exertion when her shortness of breath is severe. She otherwise denies any history of chest discomfort. Her physical mobility and functional capacity is otherwise limited only by mild chronic problems related to poor balance that has been chronically attributed to her Mnire's disease.         Past Medical History  Diagnosis Date  . Hypertension   . COPD (chronic obstructive pulmonary disease)   . Hyperlipidemia   . Meniere disease   . HX: breast cancer   . Acute CHF (congestive heart failure)   . Atrial fibrillation   . Mitral regurgitation   . COPD (chronic obstructive pulmonary disease)   . Heart murmur     as child  . Hypothyroidism   . Cancer     hx breast cancer  . Breast cancer 08/09/2001    right/rad  . Pulmonary hypertension   . Tricuspid regurgitation   . Chronic combined systolic and diastolic CHF (congestive heart failure)   . Coronary artery disease   . PONV (postoperative nausea and vomiting)   . Shortness of breath dyspnea     on exertion  . Anxiety   . GERD (gastroesophageal reflux disease)   . Arthritis     Past Surgical History  Procedure Laterality Date  . Cochlear implant Bilateral   . Mastectomy, partial Right   . Cardiac catheterization  11/2013    [facility]  . Cardiac catheterization  10/2014    [facility]  . Breast biopsy Left  11/25/06    neg  . Breast biopsy Left 01/20/12    lt bx /clip-neg  . Eye surgery Bilateral 2014    cataract surgery    Family History  Problem Relation Age of Onset  . Heart disease Father   . Heart disease Brother   . Heart attack Paternal Uncle     Social History Social History  Substance Use Topics  . Smoking status: Former Smoker -- 40 years    Types: Cigarettes    Quit date: 04/20/1998  . Smokeless tobacco: Not on file  . Alcohol Use: Yes     Comment: OCCASIONAL    Prior to Admission medications   Medication Sig Start Date End Date Taking? Authorizing Provider  benzonatate (TESSALON) 100 MG capsule Take 100 mg by mouth 3 (three) times daily as needed for cough (for cold).    Yes Historical Provider, MD  bisoprolol (ZEBETA) 10 MG tablet Take 1 tablet (10 mg total) by mouth daily. 12/12/14  Yes Wellington Hampshire, MD  fluticasone (FLONASE) 50 MCG/ACT nasal spray Place 1 spray into both nostrils daily as needed for allergies.    Yes Historical Provider, MD  furosemide (LASIX) 20 MG tablet Take 20 mg by mouth 2 (two) times daily.    Yes Historical Provider, MD  Indacaterol Maleate 75 MCG CAPS Place into inhaler and inhale daily.   Yes Historical Provider, MD  ipratropium-albuterol (DUONEB) 0.5-2.5 (3) MG/3ML SOLN Take 3 mLs by nebulization  every 6 (six) hours as needed (for shortness of breath).    Yes Historical Provider, MD  levothyroxine (SYNTHROID, LEVOTHROID) 88 MCG tablet Take 88 mcg by mouth daily before breakfast.   Yes Historical Provider, MD  losartan (COZAAR) 100 MG tablet Take 100 mg by mouth daily.   Yes Historical Provider, MD  Multiple Vitamins-Minerals (PRESERVISION AREDS PO) Take 2 tablets by mouth daily.    Yes Historical Provider, MD  pantoprazole (PROTONIX) 40 MG tablet Take 40 mg by mouth daily.   Yes Historical Provider, MD  spironolactone (ALDACTONE) 25 MG tablet Take 1 tablet (25 mg total) by mouth daily. 12/12/14  Yes Wellington Hampshire, MD  amiodarone  (PACERONE) 200 MG tablet Take 1 tablet (200 mg total) by mouth 2 (two) times daily. Patient not taking: Reported on 04/21/2015 04/17/15   Rexene Alberts, MD    Allergies  Allergen Reactions  . Augmentin [Amoxicillin-Pot Clavulanate]   . Nifedipine     Other reaction(s): Unknown  . Penicillins     Other reaction(s): Other (See Comments) Swollen joints  . Propranolol     Other reaction(s): Unknown  . Ace Inhibitors     Other reaction(s): Cough  . Diazepam     Other reaction(s): Other (See Comments) Made her "hyper"  . Meperidine     Other reaction(s): Other (See Comments) Made her "hyper"  . Propoxyphene     Other reaction(s): Other (See Comments) Made her "hyper"    Review of Systems:  General:normal appetite, decreased energy, no weight gain, no weight loss, no fever Cardiac:+ chest pain with exertion, no chest pain at rest, +SOB with exertion, no resting SOB, no PND, no orthopnea, + palpitations, + arrhythmia, + atrial fibrillation, + LE edema, no dizzy spells, no syncope Respiratory:+ shortness of breath, + home oxygen, no productive cough, no dry cough, no bronchitis, no wheezing, no hemoptysis, no asthma, no pain with inspiration or cough, no sleep apnea, no CPAP at night GI:no difficulty swallowing, + reflux, no frequent heartburn, no hiatal hernia, no abdominal pain, no constipation, no diarrhea, no hematochezia, no hematemesis, no melena GU:no dysuria, no frequency, no urinary tract infection, no hematuria, no kidney stones, no kidney disease Vascular:no pain suggestive of claudication, no pain in feet, no leg cramps, no varicose veins, no DVT, no non-healing foot ulcer Neuro:no stroke, no TIA's, no seizures, no headaches, no  temporary blindness one eye, no slurred speech, no peripheral neuropathy, no chronic pain, mild instability of gait, no memory/cognitive dysfunction Musculoskeletal:+ mild arthritis in both hips and knees but not limiting, no joint swelling, no myalgias, minor difficulty walking due to poor balance, near normal mobility  Skin:no rash, no itching, no skin infections, no pressure sores or ulcerations Psych:no anxiety, no depression, no nervousness, no unusual recent stress Eyes:no blurry vision, no floaters, no recent vision changes, + wears glasses or contacts ENT:+ hearing loss, no loose or painful teeth, no dentures, last saw dentist 02/15/2015 Hematologic:no easy bruising, no abnormal bleeding, no clotting disorder, no frequent epistaxis Endocrine:+ diabetes, does not check CBG's at home   Physical Exam:  BP 174/69 mmHg  Pulse 48  Resp 20  Ht 5' 1.5" (1.562 m)  Wt 143 lb (64.864 kg)  BMI 26.59 kg/m2  SpO2 91% General:Thin, somewhat frail but o/w well-appearing HEENT:Unremarkable  Neck:no JVD, no bruits, no adenopathy  Chest:clear to auscultation, symmetrical breath sounds, no wheezes, no rhonchi  CV:RRR, grade IV/VI holosystolic murmur best LSB Abdomen:soft, non-tender, no masses  Extremities:warm, well-perfused, pulses  diminished, no LE edema Rectal/GUDeferred Neuro:Grossly non-focal and symmetrical  throughout Skin:Clean and dry, no rashes, no breakdown   Diagnostic Tests:  Transthoracic Echocardiography  Patient:  Lizania, Bouchard MR #:    [id number] Study Date: 01/27/2015 Gender:   F Age:    47 Height:   157.5 cm Weight:   66.2 kg BSA:    1.72 m^2 Pt. Status: Room:  ATTENDING  Kathlyn Sacramento, MD ORDERING  Kathlyn Sacramento, MD REFERRING  Kathlyn Sacramento, MD PERFORMING [facility] [place]  cc:  ------------------------------------------------------------------- LV EF: 50% -  55%  ------------------------------------------------------------------- History:  PMH:  Mild chest pressure. Dyspnea. Atrial fibrillation. The dysfunction is both systolic and diastolic. Mitral valve disease. Tricuspid valve disease. Primary pulmonary hypertension. Risk factors: Hypertension. Dyslipidemia.  ------------------------------------------------------------------- Study Conclusions  - Left ventricle: The cavity size was normal. Wall thickness was normal. Systolic function was normal. The estimated ejection fraction was in the range of 50% to 55%. Wall motion was normal; there were no regional wall motion abnormalities. Features are consistent with a pseudonormal left ventricular filling pattern, with concomitant abnormal relaxation and increased filling pressure (grade 2 diastolic dysfunction). - Mitral valve: Mild myxomatous degeneration. There was severe regurgitation directed posteriorly. - Left atrium: The atrium was moderately to severely dilated. - Right atrium: The atrium was mildly dilated. - Tricuspid valve: There was moderate regurgitation. - Pulmonary arteries: Systolic pressure was severely increased. PA peak pressure: 72 mm Hg (S). - Pericardium, extracardiac: A trivial pericardial effusion  was identified.  ------------------------------------------------------------------- Labs, prior tests, procedures, and surgery: Echocardiography (10/11/2014).   EF was 40%.  ECG.   Abnormal. Transthoracic echocardiography. M-mode, complete 2D, spectral Doppler, and color Doppler. Birthdate: Patient birthdate: Aug 21, 1935. Age: Patient is 80 yr old. Sex: Gender: female. BMI: 26.7 kg/m^2. Blood pressure:   118/72 Patient status: Outpatient. Study date: Study date: 01/27/2015. Study time: 12:54 PM.  -------------------------------------------------------------------  ------------------------------------------------------------------- Left ventricle: The cavity size was normal. Wall thickness was normal. Systolic function was normal. The estimated ejection fraction was in the range of 50% to 55%. Wall motion was normal; there were no regional wall motion abnormalities. Features are consistent with a pseudonormal left ventricular filling pattern, with concomitant abnormal relaxation and increased filling pressure (grade 2 diastolic dysfunction).  ------------------------------------------------------------------- Aortic valve:  Structurally normal valve.  Cusp separation was normal. Doppler: Transvalvular velocity was within the normal range. There was no stenosis. There was no regurgitation.  ------------------------------------------------------------------- Mitral valve:  Mild myxomatous degeneration.  Leaflet separation was normal. Doppler: Transvalvular velocity was within the normal range. There was no evidence for stenosis. There was severe regurgitation directed posteriorly.  Valve area by pressure half-time: 2.82 cm^2. Indexed valve area by pressure half-time: 1.64 cm^2/m^2. Valve area by continuity equation (using LVOT flow): 1.32 cm^2. Indexed valve area by continuity equation (using LVOT flow): 0.77 cm^2/m^2.  Mean gradient (D): 3 mm Hg.  Peak gradient (D): 9 mm Hg.  ------------------------------------------------------------------- Left atrium: The atrium was moderately to severely dilated.  ------------------------------------------------------------------- Right ventricle: The cavity size was normal. Wall thickness was normal. Systolic function was normal.  ------------------------------------------------------------------- Pulmonic valve:  Structurally normal valve.  Cusp separation was normal. Doppler: Transvalvular velocity was within the normal range. There was trivial regurgitation.  ------------------------------------------------------------------- Tricuspid valve:  Structurally normal valve.  Leaflet separation was normal. Doppler: Transvalvular velocity was within the normal range. There was moderate regurgitation.  ------------------------------------------------------------------- Pulmonary artery:  Systolic pressure was severely increased.  ------------------------------------------------------------------- Right atrium: The atrium was mildly dilated.  ------------------------------------------------------------------- Pericardium: A trivial pericardial  effusion was identified.  ------------------------------------------------------------------- Systemic veins: Inferior vena cava: The vessel was normal in size. The respirophasic diameter changes were blunted (< 50%).  ------------------------------------------------------------------- Measurements  Left ventricle              Value     Reference LV ID, ED, PLAX chordal      (H)   53.8 mm    43 - 52 LV ID, ES, PLAX chordal      (H)   46.3 mm    23 - 38 LV fx shortening, PLAX chordal  (L)   14  %    >=29 LV PW thickness, ED            8.6  mm    --------- IVS/LV PW ratio, ED        (H)   1.41      <=1.3 Stroke volume, 2D              63  ml    --------- Stroke volume/bsa, 2D           37  ml/m^2  --------- LV e&', lateral              5.07 cm/s   --------- LV E/e&', lateral             28.99     --------- LV e&', medial               2.97 cm/s   --------- LV E/e&', medial              49.49     --------- LV e&', average              4.02 cm/s   --------- LV E/e&', average             36.57     ---------  Ventricular septum            Value     Reference IVS thickness, ED             12.1 mm    ---------  LVOT                   Value     Reference LVOT ID, S                19  mm    --------- LVOT area                 2.84 cm^2   --------- LVOT ID                  19  mm    --------- LVOT peak velocity, S           89.4 cm/s   --------- LVOT mean velocity, S           60.3 cm/s   --------- LVOT VTI, S                22.1 cm    --------- LVOT peak gradient, S           3   mm Hg  --------- Stroke volume (SV), LVOT DP        62.7 ml    --------- Stroke index (SV/bsa), LVOT DP      36.4 ml/m^2  ---------  Aorta                   Value  Reference Aortic root ID, ED            28  mm    ---------  Left atrium                Value     Reference LA ID, A-P, ES              52  mm    --------- LA ID/bsa, A-P          (H)   3.02 cm/m^2  <=2.2 LA volume, S               110  ml    --------- LA volume/bsa, S             64  ml/m^2  --------- LA volume, ES, 1-p A4C          106  ml    --------- LA volume/bsa, ES, 1-p A4C         61.7 ml/m^2  --------- LA volume, ES, 1-p A2C          109  ml    --------- LA volume/bsa, ES, 1-p A2C        63.4 ml/m^2  ---------  Mitral valve               Value     Reference Mitral E-wave peak velocity        147  cm/s   --------- Mitral A-wave peak velocity        71  cm/s   --------- Mitral mean velocity, D          73.4 cm/s   --------- Mitral deceleration time         153  ms    150 - 230 Mitral pressure half-time         78  ms    --------- Mitral mean gradient, D          3   mm Hg  --------- Mitral peak gradient, D          9   mm Hg  --------- Mitral E/A ratio, peak          2.1      --------- Mitral valve area, PHT, DP        2.82 cm^2   --------- Mitral valve area/bsa, PHT, DP      1.64 cm^2/m^2 --------- Mitral valve area, LVOT          1.32 cm^2   --------- continuity Mitral valve area/bsa, LVOT        0.77 cm^2/m^2 --------- continuity Mitral annulus VTI, D           47.7 cm    ---------  Pulmonary arteries            Value     Reference PA pressure, S, DP        (H)   72  mm Hg  <=30  Tricuspid valve              Value     Reference Tricuspid maximal inflow         376  cm/s   --------- velocity, PISA  Legend: (L) and (H) mark values outside specified reference range.  ------------------------------------------------------------------- Prepared and Electronically Authenticated by  Kathlyn Sacramento, MD 2016-05-20T17:48:19   [place] from diagnostic cardiac catheterization performed 10/28/2014 are personally reviewed. The patient is noted to have severe coronary artery calcification with diffuse nonobstructive coronary artery  disease. However, in  several views there appeared to be potentially significant stenosis of the proximal left anterior descending coronary artery, particularly involving trifurcation with a diagonal branch. I feel that there may be hemodynamically significant stenosis in this particular area, potentially 50-70%. The left circumflex coronary artery is quite small. There is right dominant coronary circulation. There is severe calcification throughout the right coronary artery although there does not appear to be any significant flow limiting disease. There was 30% stenosis of the right coronary artery and 40-50% stenosis of the ostial portion of the posterior descending coronary artery.Left ventricular function was moderately reduced with ejection fraction estimated 40%. Pulmonary artery pressures were reported 50/28 mmHg with mean pulmonary capillary wedge pressure 26 mmHg and large V waves of 49 mmHg. Mean central venous pressure was 18 mmHg.   TRANSESOPHAGEAL ECHOCARDIOGRAM  Films from transesophageal echocardiogram performed at the time of DC cardioversion on 11/24/2014 are personally reviewed. There was severe mitral regurgitation. The jet of regurgitation was eccentric and directed posteriorly. There was malcoaptation with an overriding anterior leaflet over a restricted posterior leaflet. There was mild sclerosis of both the anterior and posterior mitral leaflets, but they did not appear severely thickened or suggestive of underlying rheumatic disease. The posterior leaflet was somewhat short and severely restricted during both systole and diastole. There was some shadowing over the subvalvular apparatus to the posterior leaflet, but no definite thickening or foreshortening of cords to suggest rheumatic disease. There was some calcification in the posterior annulus present. There was at least moderate tricuspid regurgitation. There was mild aortic valve sclerosis with no aortic stenosis and no significant aortic  insufficiency. Left ventricular function was moderately reduced with ejection fraction estimated 35-40%.   CT ANGIOGRAPHY CHEST, ABDOMEN AND PELVIS  TECHNIQUE: Multidetector CT imaging through the chest, abdomen and pelvis was performed using the standard protocol during bolus administration of intravenous contrast. Multiplanar reconstructed images and MIPs were obtained and reviewed to evaluate the vascular anatomy.  CONTRAST: 75 mL Isovue 370  COMPARISON: None.  FINDINGS: CTA CHEST FINDINGS  The aortic root near the sinuses of Valsalva roughly measure 3.0 cm. There are prominent calcifications involving the proximal LAD and proximal left circumflex artery. Calcifications in the right coronary artery. The mid ascending thoracic aorta measures up to 3.6 cm without aneurysm. Normal aortic arch configuration. Great vessels are patent. There is no significant plaque or calcifications involving the ascending thoracic aorta but there is calcified plaque at the aortic arch and mild mixed plaque in the descending thoracic aorta. The aortic arch measures 2.8 cm in diameter. The descending thoracic aorta measures 2.7 cm. Left atrium is prominent measuring 5.6 cm in the AP dimension. Heart size is enlarged. No significant pericardial or pleural fluid. Subcarinal calcification compatible with old granulomatous disease. There is mild hilar lymphadenopathy, best demonstrated on the right side on sequence 4, image 711. Right hilar tissue measures up to the 0.9 cm in the short axis.  The trachea and mainstem bronchi are patent. There is diffuse centrilobular emphysema, particularly in the upper lobes. There is a large pleural-based calcification in the right upper lobe measuring 1.2 cm. There is mild volume loss at the base of the lingula. No large areas of airspace disease. There is a subtle 4 mm nodule along the anterior left upper lobe on sequence 5, image 28 which is nonspecific.  No acute bone abnormality.  Review of the MIP images confirms the above findings.  CTA ABDOMEN AND PELVIS FINDINGS  Atherosclerotic plaque  throughout the abdominal aorta without aneurysm. Mild narrowing at the origin of the celiac trunk. The main branches of the celiac trunk are patent. There is typical common hepatic artery anatomy. The SMA is patent with mild plaque but no significant stenosis. IMA is patent. Main left renal artery is patent without significant stenosis. There is an accessory superior left renal artery which is patent. Main right renal artery has mild narrowing at the origin. There is a patent accessory inferior right renal artery.  Mild mixed plaque in the iliac arteries but no critical stenosis. Right common femoral artery is patent with mild calcific plaque. The proximal right femoral arteries are patent. Left common femoral artery has mild calcific plaque but no significant stenosis. The proximal left femoral arteries are patent.  IVC and renal veins are patent. Portal venous system appears to be patent. There are a few hyperdense or enhancing foci in the liver, the largest area measures roughly 1.4 cm near the hepatic dome. The right hepatic lesions are less conspicuous on the delayed images. No significant mass effect from these areas of arterial enhancement. No gross abnormality to the spleen, pancreas, stomach and duodenum. Calcifications in the spleen are compatible with old granulomatous disease. Mild nodularity and thickening of left adrenal gland could represent hyperplasia but nonspecific. Normal appearance of both kidneys with probable small cyst in the left kidney interpolar region. No significant free fluid or lymphadenopathy. No acute bone abnormality. No gross abnormality to the urinary bladder. The uterus is absent. Diverticulosis in the sigmoid colon without acute colonic inflammation. No acute abnormality in the small or large  bowel.  Review of the MIP images confirms the above findings.  IMPRESSION: Mild atherosclerotic disease involving the aorta and iliac arteries without significant stenosis and no aneurysm.  Cardiomegaly with enlargement of the left atrium. Findings are compatible with mitral valve disease.  Emphysematous changes. Indeterminate 4 mm nodule in left upper lobe. Given risk factors for bronchogenic carcinoma, follow-up chest CT at 1 year is recommended. This recommendation follows the consensus statement: Guidelines for Management of Small Pulmonary Nodules Detected on CT Scans: A Statement from the [employer] as published in Radiology 2005; 237:395-400.  Evidence for old granulomatous disease. Mild hilar lymphadenopathy is nonspecific.  Coronary artery calcifications.  Scattered hyperdense or enhancing foci in the liver are indeterminate. Some of these could represent transient hepatic attenuation differences (THAD) but cannot exclude small hypervascular lesions. Suspect these are incidental findings unless the patient has a history or risk for malignancy. These could be more definitively characterized with MRI if needed.   Electronically Signed  By: Markus Daft M.D.  On: 03/17/2015 12:23   Pulmonary Function Tests  BaselinePost-bronchodilator  FVC1.92 L (80% predicted)FVC1.86 L (77% predicted) FEV11.20 L (67% predicted)FEV11.13 L (63% predicted) FEF25-750.57 L (41% predicted)FEF25-750.47 L (34% predicted)  TLC4.06 L (86% predicted) RV2.15 L (96% predicted) DLCO35% predicted     Impression:  Patient has stage D severe symptomatic primary mitral regurgitation. She presents with a one-year history of progressive  symptoms of exertional shortness of breath consistent with combined systolic and diastolic congestive heart failure. She presented acutely in February with presumably new onset persistent atrial fibrillation. She underwent successful DC cardioversion in March and has remained clinically stable in sinus rhythm since that time. However, she continues to experience symptoms of exertional shortness of breath consistent with chronic combined systolic and diastolic congestive heart failure, [place] Heart Association functional class IIB. Today the patient tells me that symptoms have progressed over  the last few weeks, and she now is experiencing more pronounced symptoms of substernal chest pressure with exertion suggestive of angina pectoris. Symptoms are always relieved by rest. I have personally reviewed the diagnostic cardiac catheterization performed last February. The patient has diffuse nonobstructive coronary artery disease with severe coronary artery calcification. In addition, she has an area of stenosis in the proximal left anterior descending coronary artery that in some views appears potentially hemodynamically significant. I have also reviewed the patient's transesophageal echocardiogram performed this past March at the time of the cardioversion. Images of the valve are much better than those seen on recent transesophageal echocardiogram and confirmed the presence of severe mitral regurgitation. There is severe restriction of the posterior leaflet, but this may be related to degenerative disease with calcification of the annulus and posterior leaflet. The jet of regurgitation is clearly somewhat eccentric and directed posteriorly. There are no obvious areas of leaflet prolapse but there is malcoaptation with the anterior leaflet overriding the restricted posterior leaflet. The patient does have a reported history of scarlet fever as a child and long history of heart murmur and it remains possible that  her valve dysfunction may be related to underlying rheumatic heart disease. However, after reviewing the transesophageal echocardiogram I am a bit more optimistic that the valve may still be repairable. Left ventricular systolic function appears moderately reduced with ejection fraction estimated 35-40%, although the ejection fraction appeared somewhat better on the more recent transthoracic echocardiogram at which time the patient was maintaining sinus rhythm. CT angiography of the abdomen and pelvis demonstrates diffuse calcification and moderate nonobstructive atherosclerotic plaque involving the abdominal aorta and iliac vessels. Under the circumstances, risks associated with femoral artery cannulation to facilitate minimally invasive approach for surgery might be somewhat elevated. Moreover, because of the patient's symptoms of angina, I am reluctant to proceed with surgery without concomitant revascularization of the left anterior descending coronary artery. As a result, I favor proceeding with surgery via conventional median sternotomy with concomitant single vessel coronary artery bypass grafting to the left anterior descending coronary artery. Repeat pulmonary function tests confirm the presence of mild to moderate obstructive lung disease and moderate baseline reduction in diffusion opacity.   Plan:  The patient and her husband were again counseled at length regarding the indications, risks and potential benefits of mitral valve repair or replacement and tricuspid valve repair. The rationale for elective surgery has been explained, including a comparison between surgery and continued medical therapy with close follow-up. The likelihood of successful and durable valve repair has been discussed with particular reference to the findings of their recent echocardiogram. Based upon these findings and previous experience, I have quoted them a greater than 50 percent likelihood of successful valve  repair. In the unlikely event that their valve cannot be successfully repaired, we discussed the possibility of replacing the mitral valve using a mechanical prosthesis with the attendant need for long-term anticoagulation versus the alternative of replacing it using a bioprosthetic tissue valve with its potential for late structural valve deterioration and failure, depending upon the patient's longevity. The patient specifically requests that if the mitral valve must be replaced that it be done using a bioprosthetic tissue valve. The patient understands and accepts all potential risks of surgery including but not limited to risk of death, stroke or other neurologic complication, myocardial infarction, congestive heart failure, respiratory failure, renal failure, bleeding requiring transfusion and/or reexploration, arrhythmia, infection or other wound complications, pneumonia, pleural and/or pericardial effusion, pulmonary embolus, aortic dissection  or other major vascular complication, or delayed complications related to valve repair or replacement including but not limited to structural valve deterioration and failure, thrombosis, embolization, endocarditis, or paravalvular leak. The relative risks and benefits of performing a maze procedure at the time of their surgery was discussed at length, including the expected likelihood of long term freedom from recurrent symptomatic atrial fibrillation and/or atrial flutter. We also discussed the rationale for surgical revascularization of the left anterior descending coronary artery. We plan to proceed with surgery on Tuesday, 04/25/2015. The patient has been instructed to stop taking Eliquis. In addition, I have given the patient a prescription for amiodarone to begin tomorrow. On the morning of surgery the patient has been instructed to take only her Synthroid, Protonix, bisoprolol, and one half of her regular scheduled dose of losartan with a sip of water.  All of their questions have been addressed.    Valentina Gu. Roxy Manns, MD 04/17/2015 1:23 PM

## 2015-04-24 NOTE — Progress Notes (Signed)
Anesthesia Chart Review: Patient is a 80 year old female scheduled for MV repair or replacement, TV repair, CABG, Maze procedure, TEE on 04/25/15 by Dr. Roxy Manns. Notes indicate he is planning single vessel bypass to the LAD.   History includes former smoker, post-operative N/V, HTN, COPD, HLD, breast cancer s/p right partial mastectomy, CAD, afib s/p DCCV, chronic combined CHF, severe MR and moderate TR 01/2015, pulmonary HTN, SOB, anxiety, GERD, hypothyroidism, hearing impaired s/p cochlear implant.  PCP is Dr. Ezequiel [name]. Cardiologist is Dr. Fletcher Anon. Eliquis is on hold.   See Dr. Ricard Dillon' 03/28/15 note (and Results Review tab) for complete details of her recent cardiopulmonary testing results including TEE, cardiac cath [facility]), CTA chest/abd/pelvis, PFTs.   04/21/15 Carotid duplex:  Summary: - Mild technical difficulty due to tortuosity in the distal regions. - Right - 1% to 39% ICA stenosis. ECA stenosis at the origin. Vertebral artery flow is antegrade. - Left - 1% to 39% ICA stenosis. Plaque formation does not support the elevated velocity in the distal ICA. Probably secondary to tortuosity. Vertebral artey flow is antegrade.  Preoperative EKG, CXR, and labs noted. UA showed few bacteria, small leukocytes, but negative nitrites. Labs are marked as reviewed by Dr. Roxy Manns.  Patient has a left sided cochlear implant. She will remove the external processor and give it to her family prior to going back in the OR. She cannot hear without it, so she requests that as much information be discussed in Holding before going back to the OR. She does do some lip reading. External processor can be replaced post-operatively when appropriate.  Further evaluation by her assigned anesthesiologist on the day of surgery. If no acute changes then I would anticipate that she could proceed as planned.  [facility] Short Stay Center/Anesthesiology Phone [phone] 04/24/2015 11:58 AM

## 2015-04-25 ENCOUNTER — Inpatient Hospital Stay (HOSPITAL_COMMUNITY): Payer: Medicare PPO | Admitting: Anesthesiology

## 2015-04-25 ENCOUNTER — Encounter (HOSPITAL_COMMUNITY): Payer: Self-pay | Admitting: *Deleted

## 2015-04-25 ENCOUNTER — Inpatient Hospital Stay (HOSPITAL_COMMUNITY): Payer: Medicare PPO

## 2015-04-25 ENCOUNTER — Encounter (HOSPITAL_COMMUNITY)
Admission: RE | Disposition: A | Discharge status: Skilled Nursing Facility | Payer: Medicare PPO | Source: Ambulatory Visit | Attending: Thoracic Surgery (Cardiothoracic Vascular Surgery)

## 2015-04-25 ENCOUNTER — Inpatient Hospital Stay (HOSPITAL_COMMUNITY): Payer: Medicare PPO | Admitting: Vascular Surgery

## 2015-04-25 ENCOUNTER — Inpatient Hospital Stay (HOSPITAL_COMMUNITY)
Admission: RE | Admit: 2015-04-25 | Discharge: 2015-05-11 | DRG: 220 | Disposition: A | Discharge status: Skilled Nursing Facility | Payer: Medicare PPO | Source: Ambulatory Visit | Attending: Thoracic Surgery (Cardiothoracic Vascular Surgery) | Admitting: Thoracic Surgery (Cardiothoracic Vascular Surgery)

## 2015-04-25 DIAGNOSIS — I5042 Chronic combined systolic (congestive) and diastolic (congestive) heart failure: Secondary | ICD-10-CM | POA: Diagnosis present

## 2015-04-25 DIAGNOSIS — H8109 Meniere's disease, unspecified ear: Secondary | ICD-10-CM | POA: Diagnosis present

## 2015-04-25 DIAGNOSIS — I498 Other specified cardiac arrhythmias: Secondary | ICD-10-CM | POA: Diagnosis not present

## 2015-04-25 DIAGNOSIS — I340 Nonrheumatic mitral (valve) insufficiency: Secondary | ICD-10-CM | POA: Diagnosis present

## 2015-04-25 DIAGNOSIS — L0390 Cellulitis, unspecified: Secondary | ICD-10-CM | POA: Diagnosis not present

## 2015-04-25 DIAGNOSIS — I9789 Other postprocedural complications and disorders of the circulatory system, not elsewhere classified: Secondary | ICD-10-CM | POA: Diagnosis not present

## 2015-04-25 DIAGNOSIS — K219 Gastro-esophageal reflux disease without esophagitis: Secondary | ICD-10-CM | POA: Diagnosis present

## 2015-04-25 DIAGNOSIS — I361 Nonrheumatic tricuspid (valve) insufficiency: Secondary | ICD-10-CM | POA: Diagnosis not present

## 2015-04-25 DIAGNOSIS — I270 Primary pulmonary hypertension: Secondary | ICD-10-CM | POA: Diagnosis present

## 2015-04-25 DIAGNOSIS — Z87891 Personal history of nicotine dependence: Secondary | ICD-10-CM

## 2015-04-25 DIAGNOSIS — Z9981 Dependence on supplemental oxygen: Secondary | ICD-10-CM | POA: Diagnosis not present

## 2015-04-25 DIAGNOSIS — F419 Anxiety disorder, unspecified: Secondary | ICD-10-CM | POA: Diagnosis present

## 2015-04-25 DIAGNOSIS — E039 Hypothyroidism, unspecified: Secondary | ICD-10-CM | POA: Diagnosis present

## 2015-04-25 DIAGNOSIS — Z9889 Other specified postprocedural states: Secondary | ICD-10-CM

## 2015-04-25 DIAGNOSIS — Z951 Presence of aortocoronary bypass graft: Secondary | ICD-10-CM

## 2015-04-25 DIAGNOSIS — I481 Persistent atrial fibrillation: Secondary | ICD-10-CM | POA: Diagnosis present

## 2015-04-25 DIAGNOSIS — I442 Atrioventricular block, complete: Secondary | ICD-10-CM | POA: Diagnosis not present

## 2015-04-25 DIAGNOSIS — J449 Chronic obstructive pulmonary disease, unspecified: Secondary | ICD-10-CM | POA: Diagnosis present

## 2015-04-25 DIAGNOSIS — I25799 Atherosclerosis of other coronary artery bypass graft(s) with unspecified angina pectoris: Secondary | ICD-10-CM

## 2015-04-25 DIAGNOSIS — M1990 Unspecified osteoarthritis, unspecified site: Secondary | ICD-10-CM | POA: Diagnosis present

## 2015-04-25 DIAGNOSIS — I4819 Other persistent atrial fibrillation: Secondary | ICD-10-CM

## 2015-04-25 DIAGNOSIS — Z853 Personal history of malignant neoplasm of breast: Secondary | ICD-10-CM | POA: Diagnosis not present

## 2015-04-25 DIAGNOSIS — I25119 Atherosclerotic heart disease of native coronary artery with unspecified angina pectoris: Secondary | ICD-10-CM | POA: Diagnosis present

## 2015-04-25 DIAGNOSIS — Z8679 Personal history of other diseases of the circulatory system: Secondary | ICD-10-CM

## 2015-04-25 DIAGNOSIS — E785 Hyperlipidemia, unspecified: Secondary | ICD-10-CM | POA: Diagnosis present

## 2015-04-25 DIAGNOSIS — T502X5A Adverse effect of carbonic-anhydrase inhibitors, benzothiadiazides and other diuretics, initial encounter: Secondary | ICD-10-CM | POA: Diagnosis not present

## 2015-04-25 DIAGNOSIS — D62 Acute posthemorrhagic anemia: Secondary | ICD-10-CM | POA: Diagnosis not present

## 2015-04-25 DIAGNOSIS — Z953 Presence of xenogenic heart valve: Secondary | ICD-10-CM

## 2015-04-25 DIAGNOSIS — I25110 Atherosclerotic heart disease of native coronary artery with unstable angina pectoris: Secondary | ICD-10-CM | POA: Diagnosis not present

## 2015-04-25 DIAGNOSIS — D6959 Other secondary thrombocytopenia: Secondary | ICD-10-CM | POA: Diagnosis present

## 2015-04-25 DIAGNOSIS — R001 Bradycardia, unspecified: Secondary | ICD-10-CM | POA: Diagnosis not present

## 2015-04-25 DIAGNOSIS — T814XXA Infection following a procedure, initial encounter: Secondary | ICD-10-CM | POA: Diagnosis not present

## 2015-04-25 DIAGNOSIS — Z79899 Other long term (current) drug therapy: Secondary | ICD-10-CM

## 2015-04-25 DIAGNOSIS — I10 Essential (primary) hypertension: Secondary | ICD-10-CM | POA: Diagnosis present

## 2015-04-25 DIAGNOSIS — I272 Pulmonary hypertension, unspecified: Secondary | ICD-10-CM | POA: Diagnosis present

## 2015-04-25 DIAGNOSIS — T8131XA Disruption of external operation (surgical) wound, not elsewhere classified, initial encounter: Secondary | ICD-10-CM

## 2015-04-25 DIAGNOSIS — I484 Atypical atrial flutter: Secondary | ICD-10-CM | POA: Diagnosis not present

## 2015-04-25 DIAGNOSIS — I081 Rheumatic disorders of both mitral and tricuspid valves: Secondary | ICD-10-CM | POA: Diagnosis present

## 2015-04-25 DIAGNOSIS — I4891 Unspecified atrial fibrillation: Secondary | ICD-10-CM | POA: Diagnosis present

## 2015-04-25 DIAGNOSIS — I2510 Atherosclerotic heart disease of native coronary artery without angina pectoris: Secondary | ICD-10-CM | POA: Diagnosis present

## 2015-04-25 DIAGNOSIS — I5022 Chronic systolic (congestive) heart failure: Secondary | ICD-10-CM | POA: Diagnosis present

## 2015-04-25 DIAGNOSIS — R0602 Shortness of breath: Secondary | ICD-10-CM

## 2015-04-25 DIAGNOSIS — Z950 Presence of cardiac pacemaker: Secondary | ICD-10-CM | POA: Diagnosis not present

## 2015-04-25 DIAGNOSIS — E876 Hypokalemia: Secondary | ICD-10-CM | POA: Diagnosis not present

## 2015-04-25 DIAGNOSIS — I071 Rheumatic tricuspid insufficiency: Secondary | ICD-10-CM | POA: Diagnosis present

## 2015-04-25 DIAGNOSIS — J9811 Atelectasis: Secondary | ICD-10-CM

## 2015-04-25 HISTORY — PX: TRICUSPID VALVE REPLACEMENT: SHX816

## 2015-04-25 HISTORY — PX: CORONARY ARTERY BYPASS GRAFT: SHX141

## 2015-04-25 HISTORY — PX: MAZE: SHX5063

## 2015-04-25 HISTORY — PX: MITRAL VALVE REPAIR: SHX2039

## 2015-04-25 HISTORY — DX: Other specified postprocedural states: Z98.890

## 2015-04-25 HISTORY — PX: CLIPPING OF ATRIAL APPENDAGE: SHX5773

## 2015-04-25 HISTORY — PX: TEE WITHOUT CARDIOVERSION: SHX5443

## 2015-04-25 HISTORY — DX: Presence of aortocoronary bypass graft: Z95.1

## 2015-04-25 HISTORY — DX: Personal history of other diseases of the circulatory system: Z86.79

## 2015-04-25 HISTORY — DX: Atrioventricular block, complete: I44.2

## 2015-04-25 HISTORY — DX: Other postprocedural complications and disorders of the circulatory system, not elsewhere classified: I97.89

## 2015-04-25 LAB — POCT I-STAT, CHEM 8
BUN: 14 mg/dL (ref 6–20)
BUN: 11 mg/dL (ref 6–20)
BUN: 15 mg/dL (ref 6–20)
BUN: 14 mg/dL (ref 6–20)
BUN: 14 mg/dL (ref 6–20)
BUN: 17 mg/dL (ref 6–20)
BUN: 13 mg/dL (ref 6–20)
BUN: 14 mg/dL (ref 6–20)
BUN: 12 mg/dL (ref 6–20)
BUN: 12 mg/dL (ref 6–20)
CALCIUM ION: 1.22 mmol/L (ref 1.13–1.30)
CALCIUM ION: 0.92 mmol/L — AB (ref 1.13–1.30)
CALCIUM ION: 1.22 mmol/L (ref 1.13–1.30)
CALCIUM ION: 1.06 mmol/L — AB (ref 1.13–1.30)
CALCIUM ION: 1.66 mmol/L — AB (ref 1.13–1.30)
CALCIUM ION: 1.15 mmol/L (ref 1.13–1.30)
CHLORIDE: 109 mmol/L (ref 101–111)
CHLORIDE: 103 mmol/L (ref 101–111)
CHLORIDE: 105 mmol/L (ref 101–111)
CHLORIDE: 104 mmol/L (ref 101–111)
CHLORIDE: 102 mmol/L (ref 101–111)
CHLORIDE: 108 mmol/L (ref 101–111)
CREATININE: 0.6 mg/dL (ref 0.44–1.00)
CREATININE: 0.5 mg/dL (ref 0.44–1.00)
CREATININE: 0.5 mg/dL (ref 0.44–1.00)
CREATININE: 0.6 mg/dL (ref 0.44–1.00)
CREATININE: 0.7 mg/dL (ref 0.44–1.00)
CREATININE: 0.6 mg/dL (ref 0.44–1.00)
CREATININE: 0.6 mg/dL (ref 0.44–1.00)
Calcium, Ion: 1.03 mmol/L — ABNORMAL LOW (ref 1.13–1.30)
Calcium, Ion: 0.99 mmol/L — ABNORMAL LOW (ref 1.13–1.30)
Calcium, Ion: 0.73 mmol/L — ABNORMAL LOW (ref 1.13–1.30)
Calcium, Ion: 1.24 mmol/L (ref 1.13–1.30)
Chloride: 105 mmol/L (ref 101–111)
Chloride: 110 mmol/L (ref 101–111)
Chloride: 105 mmol/L (ref 101–111)
Chloride: 109 mmol/L (ref 101–111)
Creatinine, Ser: 0.3 mg/dL — ABNORMAL LOW (ref 0.44–1.00)
Creatinine, Ser: 0.6 mg/dL (ref 0.44–1.00)
Creatinine, Ser: 0.6 mg/dL (ref 0.44–1.00)
GLUCOSE: 128 mg/dL — AB (ref 65–99)
GLUCOSE: 130 mg/dL — AB (ref 65–99)
GLUCOSE: 166 mg/dL — AB (ref 65–99)
GLUCOSE: 163 mg/dL — AB (ref 65–99)
GLUCOSE: 151 mg/dL — AB (ref 65–99)
GLUCOSE: 97 mg/dL (ref 65–99)
Glucose, Bld: 109 mg/dL — ABNORMAL HIGH (ref 65–99)
Glucose, Bld: 88 mg/dL (ref 65–99)
Glucose, Bld: 121 mg/dL — ABNORMAL HIGH (ref 65–99)
Glucose, Bld: 170 mg/dL — ABNORMAL HIGH (ref 65–99)
HCT: 32 % — ABNORMAL LOW (ref 36.0–46.0)
HCT: 30 % — ABNORMAL LOW (ref 36.0–46.0)
HCT: 24 % — ABNORMAL LOW (ref 36.0–46.0)
HCT: 26 % — ABNORMAL LOW (ref 36.0–46.0)
HCT: 34 % — ABNORMAL LOW (ref 36.0–46.0)
HCT: 35 % — ABNORMAL LOW (ref 36.0–46.0)
HEMATOCRIT: 21 % — AB (ref 36.0–46.0)
HEMATOCRIT: 20 % — AB (ref 36.0–46.0)
HEMATOCRIT: 19 % — AB (ref 36.0–46.0)
HEMATOCRIT: 20 % — AB (ref 36.0–46.0)
HEMOGLOBIN: 10.2 g/dL — AB (ref 12.0–15.0)
HEMOGLOBIN: 7.1 g/dL — AB (ref 12.0–15.0)
HEMOGLOBIN: 6.5 g/dL — AB (ref 12.0–15.0)
HEMOGLOBIN: 8.8 g/dL — AB (ref 12.0–15.0)
HEMOGLOBIN: 11.6 g/dL — AB (ref 12.0–15.0)
Hemoglobin: 10.9 g/dL — ABNORMAL LOW (ref 12.0–15.0)
Hemoglobin: 6.8 g/dL — CL (ref 12.0–15.0)
Hemoglobin: 8.2 g/dL — ABNORMAL LOW (ref 12.0–15.0)
Hemoglobin: 6.8 g/dL — CL (ref 12.0–15.0)
Hemoglobin: 11.9 g/dL — ABNORMAL LOW (ref 12.0–15.0)
POTASSIUM: 4.8 mmol/L (ref 3.5–5.1)
POTASSIUM: 4.8 mmol/L (ref 3.5–5.1)
POTASSIUM: 5.5 mmol/L — AB (ref 3.5–5.1)
POTASSIUM: 3.8 mmol/L (ref 3.5–5.1)
Potassium: 3.7 mmol/L (ref 3.5–5.1)
Potassium: 3.6 mmol/L (ref 3.5–5.1)
Potassium: 3.7 mmol/L (ref 3.5–5.1)
Potassium: 3.9 mmol/L (ref 3.5–5.1)
Potassium: 4.1 mmol/L (ref 3.5–5.1)
Potassium: 4.5 mmol/L (ref 3.5–5.1)
SODIUM: 141 mmol/L (ref 135–145)
SODIUM: 141 mmol/L (ref 135–145)
SODIUM: 134 mmol/L — AB (ref 135–145)
SODIUM: 143 mmol/L (ref 135–145)
Sodium: 138 mmol/L (ref 135–145)
Sodium: 132 mmol/L — ABNORMAL LOW (ref 135–145)
Sodium: 139 mmol/L (ref 135–145)
Sodium: 143 mmol/L (ref 135–145)
Sodium: 142 mmol/L (ref 135–145)
Sodium: 146 mmol/L — ABNORMAL HIGH (ref 135–145)
TCO2: 30 mmol/L (ref 0–100)
TCO2: 27 mmol/L (ref 0–100)
TCO2: 29 mmol/L (ref 0–100)
TCO2: 31 mmol/L (ref 0–100)
TCO2: 30 mmol/L (ref 0–100)
TCO2: 27 mmol/L (ref 0–100)
TCO2: 23 mmol/L (ref 0–100)
TCO2: 24 mmol/L (ref 0–100)
TCO2: 21 mmol/L (ref 0–100)
TCO2: 21 mmol/L (ref 0–100)

## 2015-04-25 LAB — CBC
HCT: 28.5 % — ABNORMAL LOW (ref 36.0–46.0)
HCT: 41 % (ref 36.0–46.0)
HEMATOCRIT: 36.5 % (ref 36.0–46.0)
HEMOGLOBIN: 12.4 g/dL (ref 12.0–15.0)
Hemoglobin: 9.4 g/dL — ABNORMAL LOW (ref 12.0–15.0)
Hemoglobin: 13.5 g/dL (ref 12.0–15.0)
MCH: 31.2 pg (ref 26.0–34.0)
MCH: 29.5 pg (ref 26.0–34.0)
MCH: 30.2 pg (ref 26.0–34.0)
MCHC: 33 g/dL (ref 30.0–36.0)
MCHC: 32.9 g/dL (ref 30.0–36.0)
MCHC: 34 g/dL (ref 30.0–36.0)
MCV: 94.7 fL (ref 78.0–100.0)
MCV: 89.7 fL (ref 78.0–100.0)
MCV: 89 fL (ref 78.0–100.0)
PLATELETS: 150 10*3/uL (ref 150–400)
PLATELETS: 138 10*3/uL — AB (ref 150–400)
Platelets: 122 10*3/uL — ABNORMAL LOW (ref 150–400)
RBC: 3.01 MIL/uL — AB (ref 3.87–5.11)
RBC: 4.57 MIL/uL (ref 3.87–5.11)
RBC: 4.1 MIL/uL (ref 3.87–5.11)
RDW: 15.3 % (ref 11.5–15.5)
RDW: 16.6 % — AB (ref 11.5–15.5)
RDW: 17.3 % — ABNORMAL HIGH (ref 11.5–15.5)
WBC: 13.2 10*3/uL — AB (ref 4.0–10.5)
WBC: 16.5 10*3/uL — AB (ref 4.0–10.5)
WBC: 16.5 10*3/uL — ABNORMAL HIGH (ref 4.0–10.5)

## 2015-04-25 LAB — POCT I-STAT 3, ART BLOOD GAS (G3+)
ACID-BASE DEFICIT: 1 mmol/L (ref 0.0–2.0)
ACID-BASE DEFICIT: 4 mmol/L — AB (ref 0.0–2.0)
ACID-BASE DEFICIT: 3 mmol/L — AB (ref 0.0–2.0)
ACID-BASE DEFICIT: 3 mmol/L — AB (ref 0.0–2.0)
ACID-BASE EXCESS: 2 mmol/L (ref 0.0–2.0)
Acid-Base Excess: 5 mmol/L — ABNORMAL HIGH (ref 0.0–2.0)
BICARBONATE: 23.3 meq/L (ref 20.0–24.0)
BICARBONATE: 23.9 meq/L (ref 20.0–24.0)
BICARBONATE: 23.2 meq/L (ref 20.0–24.0)
Bicarbonate: 29.1 mEq/L — ABNORMAL HIGH (ref 20.0–24.0)
Bicarbonate: 29.2 mEq/L — ABNORMAL HIGH (ref 20.0–24.0)
Bicarbonate: 25 mEq/L — ABNORMAL HIGH (ref 20.0–24.0)
O2 SAT: 99 %
O2 Saturation: 100 %
O2 Saturation: 100 %
O2 Saturation: 96 %
O2 Saturation: 86 %
O2 Saturation: 91 %
PCO2 ART: 40 mmHg (ref 35.0–45.0)
PCO2 ART: 46.6 mmHg — AB (ref 35.0–45.0)
PH ART: 7.327 — AB (ref 7.350–7.450)
PH ART: 7.471 — AB (ref 7.350–7.450)
PH ART: 7.256 — AB (ref 7.350–7.450)
PH ART: 7.287 — AB (ref 7.350–7.450)
PH ART: 7.322 — AB (ref 7.350–7.450)
PO2 ART: 406 mmHg — AB (ref 80.0–100.0)
PO2 ART: 137 mmHg — AB (ref 80.0–100.0)
PO2 ART: 67 mmHg — AB (ref 80.0–100.0)
TCO2: 31 mmol/L (ref 0–100)
TCO2: 30 mmol/L (ref 0–100)
TCO2: 26 mmol/L (ref 0–100)
TCO2: 25 mmol/L (ref 0–100)
TCO2: 25 mmol/L (ref 0–100)
TCO2: 25 mmol/L (ref 0–100)
pCO2 arterial: 55.6 mmHg — ABNORMAL HIGH (ref 35.0–45.0)
pCO2 arterial: 52.4 mmHg — ABNORMAL HIGH (ref 35.0–45.0)
pCO2 arterial: 50 mmHg — ABNORMAL HIGH (ref 35.0–45.0)
pCO2 arterial: 44.8 mmHg (ref 35.0–45.0)
pH, Arterial: 7.338 — ABNORMAL LOW (ref 7.350–7.450)
pO2, Arterial: 336 mmHg — ABNORMAL HIGH (ref 80.0–100.0)
pO2, Arterial: 99 mmHg (ref 80.0–100.0)
pO2, Arterial: 58 mmHg — ABNORMAL LOW (ref 80.0–100.0)

## 2015-04-25 LAB — PROTIME-INR
INR: 1.9 — ABNORMAL HIGH (ref 0.00–1.49)
INR: 1.63 — ABNORMAL HIGH (ref 0.00–1.49)
PROTHROMBIN TIME: 21.7 s — AB (ref 11.6–15.2)
Prothrombin Time: 19.4 seconds — ABNORMAL HIGH (ref 11.6–15.2)

## 2015-04-25 LAB — GLUCOSE, CAPILLARY
GLUCOSE-CAPILLARY: 111 mg/dL — AB (ref 65–99)
Glucose-Capillary: 114 mg/dL — ABNORMAL HIGH (ref 65–99)
Glucose-Capillary: 145 mg/dL — ABNORMAL HIGH (ref 65–99)
Glucose-Capillary: 114 mg/dL — ABNORMAL HIGH (ref 65–99)
Glucose-Capillary: 121 mg/dL — ABNORMAL HIGH (ref 65–99)

## 2015-04-25 LAB — APTT
APTT: 42 s — AB (ref 24–37)
APTT: 40 s — AB (ref 24–37)

## 2015-04-25 LAB — POCT I-STAT 4, (NA,K, GLUC, HGB,HCT)
Glucose, Bld: 133 mg/dL — ABNORMAL HIGH (ref 65–99)
HEMATOCRIT: 38 % (ref 36.0–46.0)
HEMOGLOBIN: 12.9 g/dL (ref 12.0–15.0)
Potassium: 4 mmol/L (ref 3.5–5.1)
Sodium: 142 mmol/L (ref 135–145)

## 2015-04-25 LAB — PREPARE RBC (CROSSMATCH)

## 2015-04-25 LAB — HEMOGLOBIN AND HEMATOCRIT, BLOOD
HEMATOCRIT: 24.4 % — AB (ref 36.0–46.0)
HEMOGLOBIN: 8.1 g/dL — AB (ref 12.0–15.0)

## 2015-04-25 LAB — FIBRINOGEN: FIBRINOGEN: 165 mg/dL — AB (ref 204–475)

## 2015-04-25 LAB — CREATININE, SERUM: Creatinine, Ser: 0.65 mg/dL (ref 0.44–1.00)

## 2015-04-25 LAB — PLATELET COUNT: PLATELETS: 68 10*3/uL — AB (ref 150–400)

## 2015-04-25 LAB — MAGNESIUM: MAGNESIUM: 2.8 mg/dL — AB (ref 1.7–2.4)

## 2015-04-25 SURGERY — REPAIR, MITRAL VALVE
Anesthesia: General | Site: Chest

## 2015-04-25 MED ORDER — CHLORHEXIDINE GLUCONATE 0.12% ORAL RINSE (MEDLINE KIT)
15.0000 mL | Freq: Two times a day (BID) | OROMUCOSAL | Status: DC
  Administered 2015-04-25: 15 mL via OROMUCOSAL

## 2015-04-25 MED ORDER — CHLORHEXIDINE GLUCONATE 4 % EX LIQD: 30.0000 mL | CUTANEOUS | Status: DC

## 2015-04-25 MED ORDER — HEPARIN SODIUM (PORCINE) 1000 UNIT/ML IJ SOLN
INTRAMUSCULAR | Status: DC | PRN
  Administered 2015-04-25: 3000 [IU] via INTRAVENOUS
  Administered 2015-04-25: 16000 [IU] via INTRAVENOUS

## 2015-04-25 MED ORDER — FENTANYL CITRATE (PF) 250 MCG/5ML IJ SOLN
INTRAMUSCULAR | Status: AC
  Filled 2015-04-25: qty 5

## 2015-04-25 MED ORDER — ONDANSETRON HCL 4 MG/2ML IJ SOLN
4.0000 mg | Freq: Four times a day (QID) | INTRAMUSCULAR | Status: DC | PRN
  Administered 2015-04-26 – 2015-04-28 (×4): 4 mg via INTRAVENOUS
  Filled 2015-04-25 (×4): qty 2

## 2015-04-25 MED ORDER — PANTOPRAZOLE SODIUM 40 MG PO TBEC
40.0000 mg | DELAYED_RELEASE_TABLET | Freq: Every day | ORAL | Status: DC
  Administered 2015-04-27: 40 mg via ORAL
  Filled 2015-04-25: qty 1

## 2015-04-25 MED ORDER — DOPAMINE-DEXTROSE 3.2-5 MG/ML-% IV SOLN
3.0000 ug/kg/min | INTRAVENOUS | Status: DC
  Administered 2015-04-25: 3 ug/kg/min via INTRAVENOUS

## 2015-04-25 MED ORDER — LEVOTHYROXINE SODIUM 88 MCG PO TABS
88.0000 ug | ORAL_TABLET | Freq: Every day | ORAL | Status: DC
  Administered 2015-04-26 – 2015-05-10 (×15): 88 ug via ORAL
  Filled 2015-04-25 (×20): qty 1

## 2015-04-25 MED ORDER — ASPIRIN EC 325 MG PO TBEC
325.0000 mg | DELAYED_RELEASE_TABLET | Freq: Every day | ORAL | Status: DC
  Administered 2015-04-26 – 2015-05-01 (×6): 325 mg via ORAL
  Filled 2015-04-25 (×7): qty 1

## 2015-04-25 MED ORDER — FAMOTIDINE IN NACL 20-0.9 MG/50ML-% IV SOLN
20.0000 mg | Freq: Once | INTRAVENOUS | Status: AC
  Administered 2015-04-25: 20 mg via INTRAVENOUS

## 2015-04-25 MED ORDER — MILRINONE IN DEXTROSE 20 MG/100ML IV SOLN
0.0000 ug/kg/min | INTRAVENOUS | Status: DC
  Administered 2015-04-25 – 2015-04-26 (×2): 0.2 ug/kg/min via INTRAVENOUS
  Administered 2015-04-26: 0.3 ug/kg/min via INTRAVENOUS
  Filled 2015-04-25 (×2): qty 100

## 2015-04-25 MED ORDER — GELATIN ABSORBABLE MT POWD
OROMUCOSAL | Status: DC | PRN
  Administered 2015-04-25 (×3): 4 mL via TOPICAL

## 2015-04-25 MED ORDER — SODIUM CHLORIDE 0.9 % IV SOLN: 10.0000 mL/h | Freq: Once | INTRAVENOUS | Status: DC

## 2015-04-25 MED ORDER — MAGNESIUM SULFATE 4 GM/100ML IV SOLN
4.0000 g | Freq: Once | INTRAVENOUS | Status: AC
  Administered 2015-04-25: 4 g via INTRAVENOUS
  Filled 2015-04-25: qty 100

## 2015-04-25 MED ORDER — LACTATED RINGERS IV SOLN
INTRAVENOUS | Status: DC
  Administered 2015-04-26: 04:00:00 via INTRAVENOUS

## 2015-04-25 MED ORDER — AMINOCAPROIC ACID 250 MG/ML IV SOLN
INTRAVENOUS | Status: DC
  Administered 2015-04-25: 12:00:00 via INTRAVENOUS
  Filled 2015-04-25: qty 40

## 2015-04-25 MED ORDER — SODIUM CHLORIDE 0.45 % IV SOLN: INTRAVENOUS | Status: DC | PRN

## 2015-04-25 MED ORDER — DOCUSATE SODIUM 100 MG PO CAPS
200.0000 mg | ORAL_CAPSULE | Freq: Every day | ORAL | Status: DC
  Administered 2015-04-26 – 2015-05-11 (×15): 200 mg via ORAL
  Filled 2015-04-25 (×17): qty 2

## 2015-04-25 MED ORDER — FENTANYL CITRATE (PF) 250 MCG/5ML IJ SOLN
INTRAMUSCULAR | Status: AC
Start: 2015-04-25 — End: 2015-04-25
  Filled 2015-04-25: qty 5

## 2015-04-25 MED ORDER — ROCURONIUM BROMIDE 100 MG/10ML IV SOLN
INTRAVENOUS | Status: DC | PRN
  Administered 2015-04-25: 20 mg via INTRAVENOUS
  Administered 2015-04-25: 30 mg via INTRAVENOUS
  Administered 2015-04-25: 40 mg via INTRAVENOUS
  Administered 2015-04-25: 60 mg via INTRAVENOUS
  Administered 2015-04-25 (×2): 50 mg via INTRAVENOUS

## 2015-04-25 MED ORDER — CALCIUM CHLORIDE 10 % IV SOLN
INTRAVENOUS | Status: DC | PRN
  Administered 2015-04-25 (×2): 500 mg via INTRAVENOUS

## 2015-04-25 MED ORDER — ROCURONIUM BROMIDE 50 MG/5ML IV SOLN
INTRAVENOUS | Status: AC
  Filled 2015-04-25: qty 1

## 2015-04-25 MED ORDER — ONDANSETRON HCL 4 MG/2ML IJ SOLN
INTRAMUSCULAR | Status: AC
  Filled 2015-04-25: qty 2

## 2015-04-25 MED ORDER — PHENYLEPHRINE HCL 10 MG/ML IJ SOLN
10.0000 mg | INTRAVENOUS | Status: DC | PRN
  Administered 2015-04-25: 10 ug/min via INTRAVENOUS

## 2015-04-25 MED ORDER — MIDAZOLAM HCL 2 MG/2ML IJ SOLN
INTRAMUSCULAR | Status: AC
  Filled 2015-04-25: qty 4

## 2015-04-25 MED ORDER — MIDAZOLAM HCL 10 MG/2ML IJ SOLN
INTRAMUSCULAR | Status: AC
  Filled 2015-04-25: qty 4

## 2015-04-25 MED ORDER — EPHEDRINE SULFATE 50 MG/ML IJ SOLN
INTRAMUSCULAR | Status: AC
  Filled 2015-04-25: qty 1

## 2015-04-25 MED ORDER — TRAMADOL HCL 50 MG PO TABS
50.0000 mg | ORAL_TABLET | ORAL | Status: DC | PRN
  Administered 2015-04-28: 100 mg via ORAL
  Administered 2015-04-29: 50 mg via ORAL
  Filled 2015-04-25: qty 2
  Filled 2015-04-25: qty 1

## 2015-04-25 MED ORDER — SODIUM CHLORIDE 0.9 % IR SOLN
Status: DC | PRN
  Administered 2015-04-25: 6000 mL

## 2015-04-25 MED ORDER — PROTAMINE SULFATE 10 MG/ML IV SOLN
INTRAVENOUS | Status: AC
  Filled 2015-04-25: qty 25

## 2015-04-25 MED ORDER — METOPROLOL TARTRATE 25 MG/10 ML ORAL SUSPENSION
12.5000 mg | Freq: Two times a day (BID) | ORAL | Status: DC
  Filled 2015-04-25 (×5): qty 5

## 2015-04-25 MED ORDER — SODIUM CHLORIDE 0.9 % IJ SOLN
INTRAMUSCULAR | Status: AC
  Filled 2015-04-25: qty 20

## 2015-04-25 MED ORDER — MIDAZOLAM HCL 2 MG/2ML IJ SOLN: 2.0000 mg | INTRAMUSCULAR | Status: DC | PRN

## 2015-04-25 MED ORDER — GLYCOPYRROLATE 0.2 MG/ML IJ SOLN
INTRAMUSCULAR | Status: DC | PRN
  Administered 2015-04-25: .2 mg via INTRAVENOUS
  Administered 2015-04-25: .1 mg via INTRAVENOUS

## 2015-04-25 MED ORDER — CALCIUM CHLORIDE 10 % IV SOLN
INTRAVENOUS | Status: AC
  Filled 2015-04-25: qty 10

## 2015-04-25 MED ORDER — GLUTARALDEHYDE 0.625% SOAKING SOLUTION
TOPICAL | Status: AC
  Administered 2015-04-25: 1 via TOPICAL
  Filled 2015-04-25: qty 50

## 2015-04-25 MED ORDER — ACETAMINOPHEN 650 MG RE SUPP
650.0000 mg | Freq: Once | RECTAL | Status: AC
  Administered 2015-04-25: 650 mg via RECTAL

## 2015-04-25 MED ORDER — DEXMEDETOMIDINE HCL IN NACL 200 MCG/50ML IV SOLN
0.0000 ug/kg/h | INTRAVENOUS | Status: DC
  Administered 2015-04-25: 0.705 ug/kg/h via INTRAVENOUS
  Administered 2015-04-25: 0.5 ug/kg/h via INTRAVENOUS
  Filled 2015-04-25: qty 50

## 2015-04-25 MED ORDER — SODIUM CHLORIDE 0.9 % IJ SOLN: 3.0000 mL | INTRAMUSCULAR | Status: DC | PRN

## 2015-04-25 MED ORDER — POTASSIUM CHLORIDE 10 MEQ/50ML IV SOLN: 10.0000 meq | INTRAVENOUS | Status: AC

## 2015-04-25 MED ORDER — NICARDIPINE HCL IN DEXTROSE 40-5 MG/200ML-% IV SOLN: INTRAVENOUS | Status: DC

## 2015-04-25 MED ORDER — ANTISEPTIC ORAL RINSE SOLUTION (CORINZ)
7.0000 mL | OROMUCOSAL | Status: DC
  Administered 2015-04-25 – 2015-04-26 (×5): 7 mL via OROMUCOSAL

## 2015-04-25 MED ORDER — METOPROLOL TARTRATE 12.5 MG HALF TABLET
12.5000 mg | ORAL_TABLET | Freq: Two times a day (BID) | ORAL | Status: DC
  Filled 2015-04-25 (×5): qty 1

## 2015-04-25 MED ORDER — HEPARIN SODIUM (PORCINE) 1000 UNIT/ML IJ SOLN
INTRAMUSCULAR | Status: AC
  Filled 2015-04-25: qty 1

## 2015-04-25 MED ORDER — MORPHINE SULFATE (PF) 2 MG/ML IV SOLN
2.0000 mg | INTRAVENOUS | Status: DC | PRN
  Administered 2015-04-26 (×3): 2 mg via INTRAVENOUS
  Administered 2015-04-26: 4 mg via INTRAVENOUS
  Administered 2015-04-26 (×2): 2 mg via INTRAVENOUS
  Administered 2015-04-27 (×2): 4 mg via INTRAVENOUS
  Filled 2015-04-25: qty 1
  Filled 2015-04-25 (×3): qty 2
  Filled 2015-04-25 (×4): qty 1

## 2015-04-25 MED ORDER — LACTATED RINGERS IV SOLN: 500.0000 mL | Freq: Once | INTRAVENOUS | Status: DC | PRN

## 2015-04-25 MED ORDER — ROCURONIUM BROMIDE 50 MG/5ML IV SOLN
INTRAVENOUS | Status: AC
  Filled 2015-04-25: qty 5

## 2015-04-25 MED ORDER — MIDAZOLAM HCL 5 MG/5ML IJ SOLN
INTRAMUSCULAR | Status: DC | PRN
  Administered 2015-04-25: 1 mg via INTRAVENOUS
  Administered 2015-04-25 (×4): 2 mg via INTRAVENOUS
  Administered 2015-04-25: 1 mg via INTRAVENOUS
  Administered 2015-04-25 (×2): 2 mg via INTRAVENOUS

## 2015-04-25 MED ORDER — LEVOFLOXACIN IN D5W 750 MG/150ML IV SOLN
750.0000 mg | INTRAVENOUS | Status: AC
  Administered 2015-04-26: 750 mg via INTRAVENOUS
  Filled 2015-04-25: qty 150

## 2015-04-25 MED ORDER — LACTATED RINGERS IV SOLN
INTRAVENOUS | Status: DC | PRN
  Administered 2015-04-25 (×2): via INTRAVENOUS

## 2015-04-25 MED ORDER — VANCOMYCIN HCL IN DEXTROSE 1-5 GM/200ML-% IV SOLN
1000.0000 mg | Freq: Once | INTRAVENOUS | Status: AC
  Administered 2015-04-25: 1000 mg via INTRAVENOUS
  Filled 2015-04-25: qty 200

## 2015-04-25 MED ORDER — ACETAMINOPHEN 160 MG/5ML PO SOLN
1000.0000 mg | Freq: Four times a day (QID) | ORAL | Status: DC
  Administered 2015-04-25 – 2015-04-26 (×2): 1000 mg
  Filled 2015-04-25 (×2): qty 40.6

## 2015-04-25 MED ORDER — ASPIRIN 81 MG PO CHEW: 324.0000 mg | CHEWABLE_TABLET | Freq: Every day | ORAL | Status: DC

## 2015-04-25 MED ORDER — PANTOPRAZOLE SODIUM 40 MG PO TBEC: 40.0000 mg | DELAYED_RELEASE_TABLET | Freq: Every day | ORAL | Status: DC

## 2015-04-25 MED ORDER — PROPOFOL 10 MG/ML IV BOLUS
INTRAVENOUS | Status: DC | PRN
  Administered 2015-04-25: 50 mg via INTRAVENOUS

## 2015-04-25 MED ORDER — PROTAMINE SULFATE 10 MG/ML IV SOLN
INTRAVENOUS | Status: DC | PRN
  Administered 2015-04-25: 19 mg via INTRAVENOUS

## 2015-04-25 MED ORDER — FENTANYL CITRATE (PF) 100 MCG/2ML IJ SOLN
INTRAMUSCULAR | Status: DC | PRN
  Administered 2015-04-25: 250 ug via INTRAVENOUS
  Administered 2015-04-25: 100 ug via INTRAVENOUS
  Administered 2015-04-25: 50 ug via INTRAVENOUS
  Administered 2015-04-25: 100 ug via INTRAVENOUS
  Administered 2015-04-25: 150 ug via INTRAVENOUS
  Administered 2015-04-25: 100 ug via INTRAVENOUS
  Administered 2015-04-25: 25 ug via INTRAVENOUS
  Administered 2015-04-25: 150 ug via INTRAVENOUS
  Administered 2015-04-25: 50 ug via INTRAVENOUS
  Administered 2015-04-25: 350 ug via INTRAVENOUS
  Administered 2015-04-25: 50 ug via INTRAVENOUS
  Administered 2015-04-25: 100 ug via INTRAVENOUS
  Administered 2015-04-25: 150 ug via INTRAVENOUS
  Administered 2015-04-25: 100 ug via INTRAVENOUS
  Administered 2015-04-25: 25 ug via INTRAVENOUS

## 2015-04-25 MED ORDER — ACETAMINOPHEN 160 MG/5ML PO SOLN: 650.0000 mg | Freq: Once | ORAL | Status: AC

## 2015-04-25 MED ORDER — BISACODYL 5 MG PO TBEC
10.0000 mg | DELAYED_RELEASE_TABLET | Freq: Every day | ORAL | Status: DC
  Administered 2015-04-26 – 2015-05-10 (×11): 10 mg via ORAL
  Filled 2015-04-25 (×14): qty 2

## 2015-04-25 MED ORDER — SODIUM CHLORIDE 0.9 % IJ SOLN
3.0000 mL | Freq: Two times a day (BID) | INTRAMUSCULAR | Status: DC
  Administered 2015-04-26 – 2015-04-27 (×3): 3 mL via INTRAVENOUS

## 2015-04-25 MED ORDER — SODIUM CHLORIDE 0.9 % IV SOLN: INTRAVENOUS | Status: DC

## 2015-04-25 MED ORDER — NITROGLYCERIN IN D5W 200-5 MCG/ML-% IV SOLN: 0.0000 ug/min | INTRAVENOUS | Status: DC

## 2015-04-25 MED ORDER — VANCOMYCIN HCL IN DEXTROSE 1-5 GM/200ML-% IV SOLN: 1000.0000 mg | Freq: Once | INTRAVENOUS | Status: DC

## 2015-04-25 MED ORDER — SODIUM CHLORIDE 0.9 % IV SOLN
INTRAVENOUS | Status: DC
  Administered 2015-04-25 (×2): 2.2 [IU]/h via INTRAVENOUS
  Filled 2015-04-25 (×2): qty 2.5

## 2015-04-25 MED ORDER — MORPHINE SULFATE (PF) 2 MG/ML IV SOLN
1.0000 mg | INTRAVENOUS | Status: AC | PRN
  Administered 2015-04-26 (×2): 1 mg via INTRAVENOUS
  Filled 2015-04-25: qty 1

## 2015-04-25 MED ORDER — MILRINONE IN DEXTROSE 20 MG/100ML IV SOLN
0.1250 ug/kg/min | INTRAVENOUS | Status: AC
  Administered 2015-04-25: .2 ug/kg/min via INTRAVENOUS
  Filled 2015-04-25: qty 100

## 2015-04-25 MED ORDER — BISACODYL 10 MG RE SUPP
10.0000 mg | Freq: Every day | RECTAL | Status: DC
Start: 2015-04-26 — End: 2015-05-11

## 2015-04-25 MED ORDER — PHENYLEPHRINE 40 MCG/ML (10ML) SYRINGE FOR IV PUSH (FOR BLOOD PRESSURE SUPPORT)
PREFILLED_SYRINGE | INTRAVENOUS | Status: AC
  Filled 2015-04-25: qty 30

## 2015-04-25 MED ORDER — PHENYLEPHRINE HCL 10 MG/ML IJ SOLN
INTRAMUSCULAR | Status: DC | PRN
  Administered 2015-04-25: 40 ug via INTRAVENOUS
  Administered 2015-04-25 (×2): 80 ug via INTRAVENOUS

## 2015-04-25 MED ORDER — INSULIN REGULAR BOLUS VIA INFUSION
0.0000 [IU] | Freq: Three times a day (TID) | INTRAVENOUS | Status: DC
  Filled 2015-04-25: qty 10

## 2015-04-25 MED ORDER — LIDOCAINE HCL (CARDIAC) 20 MG/ML IV SOLN
INTRAVENOUS | Status: DC | PRN
  Administered 2015-04-25: 60 mg via INTRAVENOUS

## 2015-04-25 MED ORDER — PROPOFOL 10 MG/ML IV BOLUS
INTRAVENOUS | Status: AC
  Filled 2015-04-25: qty 20

## 2015-04-25 MED ORDER — LACTATED RINGERS IV SOLN: INTRAVENOUS | Status: DC

## 2015-04-25 MED ORDER — METOPROLOL TARTRATE 1 MG/ML IV SOLN: 2.5000 mg | INTRAVENOUS | Status: DC | PRN

## 2015-04-25 MED ORDER — LIDOCAINE HCL (CARDIAC) 20 MG/ML IV SOLN
INTRAVENOUS | Status: AC
  Filled 2015-04-25: qty 5

## 2015-04-25 MED ORDER — OXYCODONE HCL 5 MG PO TABS
5.0000 mg | ORAL_TABLET | ORAL | Status: DC | PRN
  Administered 2015-04-27 (×2): 5 mg via ORAL
  Administered 2015-05-11: 10 mg via ORAL
  Filled 2015-04-25 (×2): qty 1
  Filled 2015-04-25: qty 2

## 2015-04-25 MED ORDER — LACTATED RINGERS IV SOLN
INTRAVENOUS | Status: DC | PRN
  Administered 2015-04-25: 07:00:00 via INTRAVENOUS

## 2015-04-25 MED ORDER — SODIUM CHLORIDE 0.9 % IV SOLN
INTRAVENOUS | Status: DC | PRN
  Administered 2015-04-25: 14:00:00 via INTRAVENOUS

## 2015-04-25 MED ORDER — SODIUM CHLORIDE 0.9 % IV SOLN
INTRAVENOUS | Status: AC
  Administered 2015-04-25: 17:00:00 via INTRAVENOUS

## 2015-04-25 MED ORDER — EPHEDRINE SULFATE 50 MG/ML IJ SOLN
INTRAMUSCULAR | Status: DC | PRN
  Administered 2015-04-25 (×3): 5 mg via INTRAVENOUS

## 2015-04-25 MED ORDER — ALBUMIN HUMAN 5 % IV SOLN
250.0000 mL | INTRAVENOUS | Status: AC | PRN
  Administered 2015-04-25 – 2015-04-26 (×4): 250 mL via INTRAVENOUS
  Filled 2015-04-25 (×2): qty 250

## 2015-04-25 MED ORDER — NICARDIPINE HCL IN NACL 40-0.83 MG/200ML-% IV SOLN
5.0000 mg/h | INTRAVENOUS | Status: DC
  Filled 2015-04-25: qty 200

## 2015-04-25 MED ORDER — SODIUM CHLORIDE 0.9 % IV SOLN: 250.0000 mL | INTRAVENOUS | Status: DC

## 2015-04-25 MED ORDER — ARTIFICIAL TEARS OP OINT
TOPICAL_OINTMENT | OPHTHALMIC | Status: DC | PRN
  Administered 2015-04-25: 1 via OPHTHALMIC

## 2015-04-25 MED ORDER — ACETAMINOPHEN 500 MG PO TABS
1000.0000 mg | ORAL_TABLET | Freq: Four times a day (QID) | ORAL | Status: AC
  Administered 2015-04-26 – 2015-04-30 (×11): 1000 mg via ORAL
  Filled 2015-04-25 (×19): qty 2

## 2015-04-25 MED ORDER — FAMOTIDINE IN NACL 20-0.9 MG/50ML-% IV SOLN
20.0000 mg | Freq: Two times a day (BID) | INTRAVENOUS | Status: AC
  Administered 2015-04-25: 20 mg via INTRAVENOUS
  Filled 2015-04-25: qty 50

## 2015-04-25 MED ORDER — PHENYLEPHRINE HCL 10 MG/ML IJ SOLN
0.0000 ug/min | INTRAVENOUS | Status: DC
  Administered 2015-04-25: 15 ug/min via INTRAVENOUS
  Administered 2015-04-25: 20 ug/min via INTRAVENOUS
  Filled 2015-04-25 (×2): qty 2

## 2015-04-25 MED ORDER — LACTATED RINGERS IV SOLN
INTRAVENOUS | Status: DC | PRN
  Administered 2015-04-25: 08:00:00 via INTRAVENOUS

## 2015-04-25 SURGICAL SUPPLY — 175 items
ADAPTER CARDIO PERF ANTE/RETRO (ADAPTER) ×6 IMPLANT
APPLICATOR COTTON TIP 6IN STRL (MISCELLANEOUS) IMPLANT
ATTRACTOMAT 16X20 MAGNETIC DRP (DRAPES) ×6 IMPLANT
BAG DECANTER FOR FLEXI CONT (MISCELLANEOUS) ×9 IMPLANT
BANDAGE ELASTIC 4 VELCRO ST LF (GAUZE/BANDAGES/DRESSINGS) IMPLANT
BANDAGE ELASTIC 6 VELCRO ST LF (GAUZE/BANDAGES/DRESSINGS) IMPLANT
BASKET HEART (ORDER IN 25'S) (MISCELLANEOUS) ×1
BASKET HEART (ORDER IN 25S) (MISCELLANEOUS) ×2 IMPLANT
BLADE STERNUM SYSTEM 6 (BLADE) ×6 IMPLANT
BLADE SURG 11 STRL SS (BLADE) ×6 IMPLANT
BLADE SURG ROTATE 9660 (MISCELLANEOUS) IMPLANT
BNDG GAUZE ELAST 4 BULKY (GAUZE/BANDAGES/DRESSINGS) IMPLANT
CANISTER SUCTION 2500CC (MISCELLANEOUS) ×6 IMPLANT
CANN PRFSN 3/8X14X24FR PCFC (MISCELLANEOUS)
CANN PRFSN 3/8XCNCT ST RT ANG (MISCELLANEOUS)
CANNULA EZ GLIDE AORTIC 21FR (CANNULA) ×6 IMPLANT
CANNULA FEM VENOUS REMOTE 22FR (CANNULA) ×3 IMPLANT
CANNULA FEMORAL ART 14 SM (MISCELLANEOUS) ×3 IMPLANT
CANNULA GUNDRY RCSP 15FR (MISCELLANEOUS) ×3 IMPLANT
CANNULA PRFSN 3/8X14X24FR PCFC (MISCELLANEOUS) IMPLANT
CANNULA PRFSN 3/8XCNCT RT ANG (MISCELLANEOUS) IMPLANT
CANNULA VEN MTL TIP RT (MISCELLANEOUS)
CARDIOBLATE CARDIAC ABLATION (MISCELLANEOUS)
CATH CPB KIT OWEN (MISCELLANEOUS) ×3 IMPLANT
CATH FOLEY 2WAY SLVR  5CC 14FR (CATHETERS)
CATH FOLEY 2WAY SLVR 5CC 14FR (CATHETERS) IMPLANT
CATH ROBINSON RED A/P 18FR (CATHETERS) ×3 IMPLANT
CATH THORACIC 28FR RT ANG (CATHETERS) IMPLANT
CATH THORACIC 36FR (CATHETERS) ×3 IMPLANT
CLAMP ISOLATOR SYNERGY LG (MISCELLANEOUS) ×6 IMPLANT
CLIP FOGARTY SPRING 6M (CLIP) IMPLANT
CLIP RETRACTION 3.0MM CORONARY (MISCELLANEOUS) ×3 IMPLANT
CLIP TI MEDIUM 24 (CLIP) IMPLANT
CLIP TI WIDE RED SMALL 24 (CLIP) IMPLANT
CONN 1/2X1/2X1/2  BEN (MISCELLANEOUS) ×2
CONN 1/2X1/2X1/2 BEN (MISCELLANEOUS) ×4 IMPLANT
CONN 3/8X1/2 ST GISH (MISCELLANEOUS) ×12 IMPLANT
CONN ST 1/4X3/8  BEN (MISCELLANEOUS) ×3
CONN ST 1/4X3/8 BEN (MISCELLANEOUS) ×6 IMPLANT
CONNECTOR 1/2X3/8X1/2 3 WAY (MISCELLANEOUS) ×1
CONNECTOR 1/2X3/8X1/2 3WAY (MISCELLANEOUS) ×2 IMPLANT
CONT SPEC 4OZ CLIKSEAL STRL BL (MISCELLANEOUS) ×3 IMPLANT
COVER SURGICAL LIGHT HANDLE (MISCELLANEOUS) ×12 IMPLANT
CRADLE DONUT ADULT HEAD (MISCELLANEOUS) ×6 IMPLANT
DERMABOND ADVANCED (GAUZE/BANDAGES/DRESSINGS) ×1
DERMABOND ADVANCED .7 DNX12 (GAUZE/BANDAGES/DRESSINGS) ×2 IMPLANT
DEVICE CARDIOBLATE CARDIAC ABL (MISCELLANEOUS) IMPLANT
DEVICE SUT CK QUICK LOAD INDV (Prosthesis & Implant Heart) ×9 IMPLANT
DEVICE SUT CK QUICK LOAD MINI (Prosthesis & Implant Heart) ×12 IMPLANT
DRAIN CHANNEL 32F RND 10.7 FF (WOUND CARE) ×12 IMPLANT
DRAPE BILATERAL SPLIT (DRAPES) IMPLANT
DRAPE CARDIOVASCULAR INCISE (DRAPES) ×1
DRAPE CV SPLIT W-CLR ANES SCRN (DRAPES) IMPLANT
DRAPE INCISE IOBAN 66X45 STRL (DRAPES) ×6 IMPLANT
DRAPE PROXIMA HALF (DRAPES) ×3 IMPLANT
DRAPE SLUSH/WARMER DISC (DRAPES) ×6 IMPLANT
DRAPE SRG 135X102X78XABS (DRAPES) ×2 IMPLANT
DRSG AQUACEL AG ADV 3.5X14 (GAUZE/BANDAGES/DRESSINGS) ×3 IMPLANT
DRSG COVADERM 4X14 (GAUZE/BANDAGES/DRESSINGS) ×6 IMPLANT
ELECT BLADE 4.0 EZ CLEAN MEGAD (MISCELLANEOUS) ×3
ELECT REM PT RETURN 9FT ADLT (ELECTROSURGICAL) ×12
ELECTRODE BLDE 4.0 EZ CLN MEGD (MISCELLANEOUS) ×2 IMPLANT
ELECTRODE REM PT RTRN 9FT ADLT (ELECTROSURGICAL) ×8 IMPLANT
GAUZE SPONGE 4X4 12PLY STRL (GAUZE/BANDAGES/DRESSINGS) ×12 IMPLANT
GLOVE BIO SURGEON STRL SZ 6 (GLOVE) ×6 IMPLANT
GLOVE BIO SURGEON STRL SZ 6.5 (GLOVE) ×18 IMPLANT
GLOVE BIO SURGEON STRL SZ7 (GLOVE) IMPLANT
GLOVE BIO SURGEON STRL SZ7.5 (GLOVE) ×3 IMPLANT
GLOVE BIO SURGEON STRL SZ8.5 (GLOVE) ×3 IMPLANT
GLOVE BIOGEL PI IND STRL 7.0 (GLOVE) ×2 IMPLANT
GLOVE BIOGEL PI INDICATOR 7.0 (GLOVE) ×1
GLOVE ORTHO TXT STRL SZ7.5 (GLOVE) ×6 IMPLANT
GOWN STRL REUS W/ TWL LRG LVL3 (GOWN DISPOSABLE) ×16 IMPLANT
GOWN STRL REUS W/TWL LRG LVL3 (GOWN DISPOSABLE) ×8
HEMOSTAT POWDER SURGIFOAM 1G (HEMOSTASIS) ×9 IMPLANT
INSERT FOGARTY XLG (MISCELLANEOUS) ×6 IMPLANT
IV NS IRRIG 3000ML ARTHROMATIC (IV SOLUTION) ×3 IMPLANT
KIT BASIN OR (CUSTOM PROCEDURE TRAY) ×6 IMPLANT
KIT DILATOR VASC 18G NDL (KITS) ×3 IMPLANT
KIT DRAINAGE VACCUM ASSIST (KITS) ×3 IMPLANT
KIT ROOM TURNOVER OR (KITS) ×6 IMPLANT
KIT SUCTION CATH 14FR (SUCTIONS) ×18 IMPLANT
KIT SUT CK MINI COMBO 4X17 (Prosthesis & Implant Heart) ×3 IMPLANT
KIT VASOVIEW W/TROCAR VH 2000 (KITS) ×3 IMPLANT
LEAD PACING MYOCARDI (MISCELLANEOUS) ×9 IMPLANT
LINE VENT (MISCELLANEOUS) ×3 IMPLANT
LOOP VESSEL SUPERMAXI WHITE (MISCELLANEOUS) ×6 IMPLANT
MARKER GRAFT CORONARY BYPASS (MISCELLANEOUS) ×9 IMPLANT
MASK FACE 3 LYR ANT FOG FR FLM (MASK) ×2 IMPLANT
MASK SURG FACE ANTI FOG ADLT (MASK) ×1
NS IRRIG 1000ML POUR BTL (IV SOLUTION) ×33 IMPLANT
PACK OPEN HEART (CUSTOM PROCEDURE TRAY) ×6 IMPLANT
PAD ARMBOARD 7.5X6 YLW CONV (MISCELLANEOUS) ×6 IMPLANT
PAD ELECT DEFIB RADIOL ZOLL (MISCELLANEOUS) ×3 IMPLANT
PENCIL BUTTON HOLSTER BLD 10FT (ELECTRODE) ×3 IMPLANT
PROBE CRYO2-ABLATION MALLABLE (MISCELLANEOUS) ×3 IMPLANT
PUNCH AORTIC ROTATE 4.0MM (MISCELLANEOUS) IMPLANT
PUNCH AORTIC ROTATE 4.5MM 8IN (MISCELLANEOUS) IMPLANT
PUNCH AORTIC ROTATE 5MM 8IN (MISCELLANEOUS) IMPLANT
RING TRICUSPID T26 (Prosthesis & Implant Heart) ×3 IMPLANT
SENSOR MYOCARDIAL TEMP (MISCELLANEOUS) ×3 IMPLANT
SET CARDIOPLEGIA MPS 5001102 (MISCELLANEOUS) ×3 IMPLANT
SET IRRIG TUBING LAPAROSCOPIC (IRRIGATION / IRRIGATOR) ×3 IMPLANT
SOLUTION ANTI FOG 6CC (MISCELLANEOUS) IMPLANT
SPONGE GAUZE 4X4 12PLY STER LF (GAUZE/BANDAGES/DRESSINGS) ×3 IMPLANT
SPONGE LAP 18X18 X RAY DECT (DISPOSABLE) ×6 IMPLANT
SPONGE LAP 4X18 X RAY DECT (DISPOSABLE) ×3 IMPLANT
SUCKER INTRACARDIAC WEIGHTED (SUCKER) ×6 IMPLANT
SUT BONE WAX W31G (SUTURE) ×6 IMPLANT
SUT ETHIBON 2 0 V 52N 30 (SUTURE) ×6 IMPLANT
SUT ETHIBOND (SUTURE) ×6 IMPLANT
SUT ETHIBOND 2 0 SH (SUTURE) ×17 IMPLANT
SUT ETHIBOND 2 0 SH 36X2 (SUTURE) ×16 IMPLANT
SUT ETHIBOND 2 0 V4 (SUTURE) IMPLANT
SUT ETHIBOND 2 0V4 GREEN (SUTURE) IMPLANT
SUT ETHIBOND 2-0 RB-1 WHT (SUTURE) ×6 IMPLANT
SUT ETHIBOND 4 0 TF (SUTURE) IMPLANT
SUT ETHIBOND 5 0 C 1 30 (SUTURE) ×6 IMPLANT
SUT ETHIBOND X763 2 0 SH 1 (SUTURE) ×12 IMPLANT
SUT GORETEX CV4 TH-18 (SUTURE) ×3 IMPLANT
SUT MNCRL AB 3-0 PS2 18 (SUTURE) ×12 IMPLANT
SUT MNCRL AB 4-0 PS2 18 (SUTURE) IMPLANT
SUT PDS AB 1 CTX 36 (SUTURE) ×12 IMPLANT
SUT PROLENE 2 0 SH DA (SUTURE) IMPLANT
SUT PROLENE 3 0 SH 1 (SUTURE) ×6 IMPLANT
SUT PROLENE 3 0 SH DA (SUTURE) ×21 IMPLANT
SUT PROLENE 3 0 SH1 36 (SUTURE) ×24 IMPLANT
SUT PROLENE 4 0 RB 1 (SUTURE) ×13
SUT PROLENE 4 0 SH DA (SUTURE) ×12 IMPLANT
SUT PROLENE 4-0 RB1 .5 CRCL 36 (SUTURE) ×26 IMPLANT
SUT PROLENE 5 0 C 1 36 (SUTURE) ×6 IMPLANT
SUT PROLENE 6 0 C 1 30 (SUTURE) ×18 IMPLANT
SUT PROLENE 7.0 RB 3 (SUTURE) ×9 IMPLANT
SUT PROLENE 8 0 BV175 6 (SUTURE) ×6 IMPLANT
SUT PROLENE BLUE 7 0 (SUTURE) ×3 IMPLANT
SUT PROLENE POLY MONO (SUTURE) IMPLANT
SUT SILK  1 MH (SUTURE) ×8
SUT SILK 1 MH (SUTURE) ×16 IMPLANT
SUT SILK 1 TIES 10X30 (SUTURE) ×3 IMPLANT
SUT SILK 2 0 SH CR/8 (SUTURE) ×6 IMPLANT
SUT SILK 2 0 TIES 10X30 (SUTURE) ×3 IMPLANT
SUT SILK 2 0 TIES 17X18 (SUTURE) ×1
SUT SILK 2-0 18XBRD TIE BLK (SUTURE) ×2 IMPLANT
SUT SILK 3 0 SH CR/8 (SUTURE) ×3 IMPLANT
SUT SILK 4 0 TIE 10X30 (SUTURE) ×6 IMPLANT
SUT STEEL 6MS V (SUTURE) ×3 IMPLANT
SUT STEEL STERNAL CCS#1 18IN (SUTURE) IMPLANT
SUT STEEL SZ 6 DBL 3X14 BALL (SUTURE) ×6 IMPLANT
SUT TEM PAC WIRE 2 0 SH (SUTURE) ×3 IMPLANT
SUT VIC AB 1 CTX 36 (SUTURE)
SUT VIC AB 1 CTX36XBRD ANBCTR (SUTURE) IMPLANT
SUT VIC AB 2-0 CT1 27 (SUTURE)
SUT VIC AB 2-0 CT1 TAPERPNT 27 (SUTURE) IMPLANT
SUT VIC AB 2-0 CTX 27 (SUTURE) ×9 IMPLANT
SUT VIC AB 3-0 SH 27 (SUTURE)
SUT VIC AB 3-0 SH 27X BRD (SUTURE) IMPLANT
SUT VIC AB 3-0 X1 27 (SUTURE) ×6 IMPLANT
SUT VICRYL 4-0 PS2 18IN ABS (SUTURE) IMPLANT
SUTURE E-PAK OPEN HEART (SUTURE) ×3 IMPLANT
SYS ARTICLIP LAA EXCLUSION 150 (Clip) ×3 IMPLANT
SYS ATRICLIP LAA EXCLUSION 45 (CLIP) IMPLANT
SYSTEM SAHARA CHEST DRAIN ATS (WOUND CARE) ×9 IMPLANT
TAPE CLOTH SURG 4X10 WHT LF (GAUZE/BANDAGES/DRESSINGS) ×3 IMPLANT
TAPE PAPER 2X10 WHT MICROPORE (GAUZE/BANDAGES/DRESSINGS) ×3 IMPLANT
TOWEL OR 17X24 6PK STRL BLUE (TOWEL DISPOSABLE) ×12 IMPLANT
TOWEL OR 17X26 10 PK STRL BLUE (TOWEL DISPOSABLE) ×12 IMPLANT
TRAY FOLEY IC TEMP SENS 14FR (CATHETERS) IMPLANT
TRAY FOLEY IC TEMP SENS 16FR (CATHETERS) ×3 IMPLANT
TUBE SUCT INTRACARD DLP 20F (MISCELLANEOUS) ×3 IMPLANT
TUBING INSUFFLATION (TUBING) IMPLANT
TUBING INSUFFLATION 10FT LAP (TUBING) ×3 IMPLANT
UNDERPAD 30X30 INCONTINENT (UNDERPADS AND DIAPERS) ×3 IMPLANT
VALVE MAGNA MITRAL 27MM (Prosthesis & Implant Heart) ×3 IMPLANT
WATER STERILE IRR 1000ML POUR (IV SOLUTION) ×6 IMPLANT
WIRE BENTSON .035X145CM (WIRE) ×3 IMPLANT

## 2015-04-25 NOTE — Progress Notes (Signed)
Pt is now on SIMV rate of 4bpm, 40%. Pt is tolerating wean well. RN aware, RT will continue to closely monitor patient

## 2015-04-25 NOTE — OR Nursing (Signed)
13:10 - 45 minute call to SICU charge nurse 

## 2015-04-25 NOTE — Progress Notes (Signed)
  Echocardiogram 2D Echocardiogram has been performed.  [name] 04/25/2015, 10:13 AM

## 2015-04-25 NOTE — Brief Op Note (Signed)
04/25/2015       [address]       [place] [address]             [phone]     04/25/2015  1:21 PM  PATIENT:  [name]  80 y.o. female  PRE-OPERATIVE DIAGNOSIS:  Mitral Regurgitation, Tricuspid Regurgitation, CAD, Atrial Fibrillation  POST-OPERATIVE DIAGNOSIS:  Mitral Regurgitation, Tricuspid Regurgitation, CAD, Atrial Fibrillation  PROCEDURE:  Procedure(s): MITRAL VALVE  REPLACEMENT using a 27 mm Edwards Perimount Magna Mitral Ease Valve TRICUSPID VALVE REPAIR #26 MC3 ring annuloplasty CORONARY ARTERY BYPASS GRAFTING (CABG) x ONE, using left internal mammary artery (LIMA-LAD) MAZE PROCEDURE CLIPPING OF LEFT ATRIAL APPENDAGE TRANSESOPHAGEAL ECHOCARDIOGRAM (TEE)  SURGEON:    Rexene Alberts, MD  ASSISTANTS:  John Giovanni, PA-C  ANESTHESIA:   Suzette Battiest, MD  CROSSCLAMP TIME:   98'  CARDIOPULMONARY BYPASS TIME: 241'  FINDINGS:  Rheumatic and degenerative mitral valve disease  Type IIIA and type I mitral valve dysfunction with severe mitral regurgitation  Type I (functional) tricuspid valve dysfunction with moderate-severe (3+) tricuspid regurgitation  Normal LV systolic function  Good quality LIMA conduit for grafting  Good quality target vessel for grafting  Fragile epicardial fat over entire right ventricular surfact   Mitral/Tricuspid/Pulmonary Valve Procedure  Mitral Valve Procedure Performed:  [id number]  Size 27 Model number [id number].    Tricuspid Valve Procedure Performed:  Annuloplasty only.  Implant: Annuloplasty Device; Implant Model Number 8329, Size 26, Unique Device Identifier [id number]      Mitral Valve Etiology  MV Insufficiency: Severe  MV Disease: Yes.  MV Stenosis: No mitral valve stenosis.  MV Disease Functional Class: Class 1 and 3A  Etiology (Choose at least one and up to five): Rheumatic. and degenerative  MV Lesions (Choose at least one): Annular dilation. and Leaflet calcification.   Maze  Procedure  Surgical Approach: Median sternotomy  Cut-and-sew:  No.  Cryo: Yes  Cryo Lesions (select all that apply):       6  Mitral Valve Cryo Lesion,     10  Tricuspid Cryo Lesion, 16    Radiofrequency:  Yes.  Bipolar: Yes.  RF Lesions (select all that apply):      1   Pulmonary Vein Isolation,    2   Box Lesion,   3a  Inferior Pulmonary Vein Connecting Lesion,   3b  Superior Pulmonary Vein Connecting Lesion,     4  Posterior Mirtal Annular Line,    11  Intercaval Line,   15a  RAA Lateral Wall (Short) and   15b  RAA Lateral Wall to "T" Lesion    Left Atrial Appendage Treatment:    Yes -  Atricure epicardial clip   COMPLICATIONS: None  BASELINE WEIGHT: 66 kg  PATIENT DISPOSITION:   TO SICU IN STABLE CONDITION  Rexene Alberts 04/25/2015 3:56 PM

## 2015-04-25 NOTE — Op Note (Signed)
CARDIOTHORACIC SURGERY OPERATIVE NOTE  Date of Procedure:  04/25/2015  Preoperative Diagnosis:   Severe Mitral Regurgitation  Moderate-Severe Tricuspid Regurgitation  Coronary Artery Disease  Recurrent Persistent Atrial Fibrillation  Postoperative Diagnosis: Same  Procedure:   Mitral Valve Replacement   Edwards Magna Mitral Bovine Bioprosthetic Tissue Valve (size 27 mm, model #7300TFX, serial #[id number])   Tricuspid Valve Repair   Edwards mc3 ring annuloplasty (size 6mm, model #4900, serial #[id number])   Coronary Artery Bypass Grafting x1  Left internal mammary artery to distal left anterior descending coronary artery   Maze Procedure   complete bilateral atrial atrial lesion set using bipolar radiofrequency and cryothermy ablation  clipping of left atrial appendage (Atriclip size 17mm)   Surgeon: Valentina Gu. Roxy Manns, MD  Assistant: John Giovanni, PA-C  Anesthesia: Suzette Battiest, MD  Operative Findings:  Rheumatic and degenerative mitral valve disease  Type IIIA and type I mitral valve dysfunction with severe mitral regurgitation  Type I (functional) tricuspid valve dysfunction with moderate-severe (3+) tricuspid regurgitation  Normal LV systolic function  Good quality LIMA conduit for grafting  Good quality target vessel for grafting  Fragile epicardial fat over entire right ventricular surface                     BRIEF CLINICAL NOTE AND INDICATIONS FOR SURGERY  Patient is a 80 year old female from [place] with history of hypertension, COPD, hyperlipidemia, and Mnire's disease who more recently has been diagnosed with acute on chronic combined systolic and diastolic congestive heart failure with mitral regurgitation, tricuspid regurgitation, and persistent atrial fibrillation status post DC cardioversion who has now been referred for surgical consultation to discuss options for management of mitral regurgitation, tricuspid regurgitation,  and atrial fibrillation. The patient states that she suffered from scarlet fever at age 11 or 15. She was first told that she had a heart murmur at age 89. She never had any formal cardiac evaluation until more recently. She has a long-standing history of tobacco abuse but quit smoking in 1999. She has remained physically active and functional independent until the past year when she began to develop progressive symptoms of exertional shortness of breath. Symptoms became acutely worse in December and were initially attributed to COPD and she was treated with bronchodilator therapy and home oxygen. The patient was hospitalized in February of this year at [facility] with acute systolic congestive heart failure with presumably new-onset atrial fibrillation. Transthoracic echocardiogram performed at that time reportedly demonstrated ejection fraction 35-40% with apical wall hypokinesis and moderate to severe mitral regurgitation. Left and right heart catheterization was performed 10/28/2014 demonstrating mild nonobstructive coronary artery disease. Left ventricular ejection fraction was estimated 40%. Right heart catheterization revealed moderate pulmonary hypertension with PA pressures measured 50/28 mmHg and mean pulmonary capillary wedge pressure 26 mmHg. There were large V waves consistent with severe mitral regurgitation. Mean central venous pressure was measured 18 mmHg. The patient's atrial fibrillation was treated using Cardizem and bisoprolol for rate control with Eliquis for anticoagulation. One month later the patient underwent TEE guided cardioversion, and she has been maintaining sinus rhythm ever since. The patient has been followed carefully by Dr. Fletcher Anon and has done fairly well on medical therapy. However, the patient continues to experience stable symptoms of exertional shortness of breath consistent with chronic combined systolic and diastolic congestive heart failure, [place] Heart Association functional class IIB. Follow-up transthoracic echocardiogram performed 01/27/2015 revealed improved left ventricular systolic function with ejection fraction estimated 50-55%.  However, there remained severe mitral regurgitation. The patient was referred for surgical consultation. The patient has been seen in consultation and counseled at length regarding the indications, risks and potential benefits of surgery.  All questions have been answered, and the patient provides full informed consent for the operation as described.    DETAILS OF THE OPERATIVE PROCEDURE  Preparation:  The patient is brought to the operating room on the above mentioned date and central monitoring was established by the anesthesia team including placement of Swan-Ganz catheter and radial arterial line. Initial attempts to place a Swan-Ganz catheter from the left internal jugular approach were unsuccessful. Subsequently the Swan-Ganz catheter was placed through a second introducing sheath placed through the left subclavian vein.  The patient is placed in the supine position on the operating table.  Intravenous antibiotics are administered. General endotracheal anesthesia is induced uneventfully. A Foley catheter is placed.  Baseline transesophageal echocardiogram was performed.  Findings were notable for severe mitral regurgitation. There appeared to be severe restriction of the posterior leaflet with secondary malcoaptation and an overriding anterior leaflet causing an eccentric jet of mitral regurgitation directed posteriorly. At no point did the free margin of the anterior leaflet across the plane of the mitral annulus, so there was no anterior prolapse. There was moderate thickening of the free margin of the anterior leaflet. There appeared to be some foreshortening of the subvalvular apparatus to the posterior leaflet with significant annular calcification. Left ventricular size and systolic function was  normal. Right ventricular function appeared normal. There was moderate to severe (3+) tricuspid regurgitation that was directed eccentrically towards the interatrial septum of the right atrium. The tricuspid annulus measured 3.3 cm in diameter. The aortic valve appeared normal.  The patient's chest, abdomen, both groins, and both lower extremities are prepared and draped in a sterile manner. A time out procedure is performed.   Surgical Approach and Conduit Harvest:  A median sternotomy incision was performed and the left internal mammary artery is dissected from the chest wall and prepared for bypass grafting. The left internal mammary artery is notably good quality conduit.  Following systemic heparinization, the left internal mammary artery was transected distally noted to have excellent flow.  The pericardium is opened. There was a small amount of fresh blood and clot in the anterior mediastinum immediately below the innominate vein suggestive of minor trauma during central line placement.  The ascending aorta is normal in appearance.    Extracorporeal Cardiopulmonary Bypass and Myocardial Protection:  The right common femoral vein is cannulated using the Seldinger technique and a guidewire advanced into the right atrium using TEE guidance.  A right internal jugular vein is cannulated using the Seldinger technique and a guidewire advanced into the right atrium using TEE guidance.  The patient is heparinized systemically and the femoral vein cannulated using a 22 French long femoral venous cannula.  The right internal jugular vein is cannulated using a 14 French pediatric femoral venous cannula.  The ascending aorta is cannulated for cardiopulmonary bypass.  Adequate heparinization is verified.   A retrograde cardioplegia cannula is placed through the right atrium into the coronary sinus.  The entire pre-bypass portion of the operation was notable for stable hemodynamics.  Cardiopulmonary bypass  was begun and the surface of the heart is inspected.  A cardioplegia cannula is placed in the ascending aorta.  A temperature probe was placed in the interventricular septum.  The patient is cooled to 32C systemic temperature.  The aortic cross clamp  is applied and cold blood cardioplegia is delivered initially in an antegrade fashion through the aortic root.  Supplemental cardioplegia is given retrograde through the coronary sinus catheter.  Iced saline slush is applied for topical hypothermia.  The initial cardioplegic arrest is rapid with early diastolic arrest.  Repeat doses of cardioplegia are administered intermittently throughout the entire cross clamp portion of the operation through the aortic root and through the coronary sinus catheter in order to maintain completely flat electrocardiogram and septal myocardial temperature below 15C.  Myocardial protection was felt to be excellent.   Coronary Artery Bypass Grafting:  The distal left anterior coronary artery was grafted with the left internal mammary artery in an end-to-side fashion.  At the site of distal anastomosis the target vessel was good quality and measured approximately 1.8 mm in diameter.   Maze Procedure (left atrial lesion set):  The AtriCure Synergy bipolar radiofrequency ablation clamp is used for all radiofrequency ablation lesions for the maze procedure.  The Atricure CryoICE nitrous oxide cryothermy system is utilized for all cryothermy ablation lesions.   The heart is retracted towards the surgeon's side and the left sided pulmonary veins exposed.  An elliptical ablation lesion is created around the base of the left sided pulmonary veins.  A similar elliptical lesion was created around the base of the left atrial appendage.  The left atrial appendage was obliterated using an Atricure left atrial appendage clip (Atriclip, size 50 mm).  The heart was replaced into the pericardial sac.  A left atriotomy incision was  performed through the interatrial groove and extended partially across the back wall of the left atrium after opening the oblique sinus inferiorly.  The floor of the left atrium and the mitral valve were exposed using a self-retaining retractor.    An ablation lesion was placed around the right sided pulmonary veins using the bipolar clamp with one limb of the clamp along the endocardial surface and one along the epicardial surface posteriorly.  A bipolar ablation lesion was placed across the dome of the left atrium from the cephalad apex of the atriotomy incision to reach the cephalad apex of the elliptical lesion around the left sided pulmonary veins.  A similar bipolar lesion was placed across the back wall of the left atrium from the caudad apex of the atriotomy incision to reach the caudad apex of the elliptical lesion around the left sided pulmonary veins, thereby completing a box.  Finally another bipolar lesion was placed across the back wall of the left atrium from the caudad apex of the atriotomy incision towards the posterior mitral valve annulus.  This lesion was completed along the endocardial surface onto the posterior mitral annulus with a 3 minute duration cryothermy lesion, followed by a second cryothermy lesion along the posterior epicardial surface of the left atrium to the coronary sinus.  This completes the entire left side lesion set of the Cox maze procedure.   Mitral Valve Replacement:  The mitral valve was inspected and notable for findings consistent with rheumatic and degenerative disease.  There was a moderate amount of chronic thickening and fibrosis involving the free margin of both the anterior and posterior leaflets. There was foreshortening of the subvalvular apparatus in the midportion of the posterior leaflet (P2) with severe leaflet restriction. The chordae tendineae were mildly thickened to the anterior leaflet. There was significant annular dilatation.  There was a  moderate amount of calcification in the posterior annulus. There was some calcification in the midportion of the  anterior leaflet. The overall size of the anterior leaflet was relatively small, corresponding to the size of a 26 mm annuloplasty ring. At this point a decision was made to proceed with mitral valve replacement rather than attempt repair.  The anterior leaflet of the mitral valve was excised sharply, leaving a small rim of the free margin and the associated primary chords.  The posterior leaflet split in the midline.  The mitral annulus was sized to accept a 27 mm prosthesis.  The left ventricle was irrigated with copious cold saline solution.  Mitral valve replacement was performed using interrupted horizontal mattress 2-0 Ethibond pledgeted sutures with pledgets in the supraannular position.  The remaining portions of the anterior leaflet were incorporated into the suture line laterally, thereby preserving chords to both the anterior and posterior leaflet.  An [facility] Mitral bovine bioprosthetic tissue valve (size 27 mm, model # 7300TFX, serial # [id number]) was implanted uneventfully. The valve seated appropriately with care to position the commissure posts away from the left ventricular outflow tract.  All valve sutures were clipped using a Cor-knot device.   The atriotomy was closed using a 2-layer closure of running 3-0 Prolene suture after placing a sump drain across the mitral valve to serve as a left ventricular vent.     Maze Procedure (right atrial lesion set):  The inferior vena cava cannula was pulled down until the tip was just below the junction between the right atrium and the inferior vena cava.  Umbilical tapes are placed around the superior vena cava and the inferior vena cava.  An oblique incision is made in the right atrium. Traction sutures are placed to facilitate exposure of the tricuspid valve.  The AtriCure Synergy bipolar radiofrequency ablation clamp is utilized  to create a series of linear lesions in the right atrium, each with one limb of the clamp along the endocardial surface and the other along the epicardial surface. The first lesion is placed from the posterior apex of the atriotomy incision and along the lateral wall of the right atrium to reach the lateral aspect of the superior vena cava. A second lesion is placed in the opposite direction from the posterior apex of the atriotomy incision along the lateral wall to reach the lateral aspect of the inferior vena cava. A third lesion is placed from the midportion of the atriotomy incision extending at a right angle to reach the tip of the right atrial appendage. A fourth lesion is placed from the anterior apex of the atriotomy incision in an anterior and inferior direction to reach the acute margin of the heart. Finally, the cryotherapy probe is utilized to complete the right atrial lesion set by placing the probe along the endocardial surface of the right atrium from the anterior apex of the atriotomy incision to reach the tricuspid annulus at the 2:00 position.    Tricuspid Valve Repair:  The tricuspid valve is inspected carefully. The tricuspid valve leaflets appear normal with normal mobility and no sign of any fibrosis or thickening. Ring annuloplasty is performed using interrupted 2-0 Ethibond horizontal mattress sutures placed circumferentially around the tricuspid annulus with exception of the area immediately below the triangle of Koch. The valve was sized to accept a 26 mm annuloplasty ring based upon the overall surface area of the combined anterior and posterior leaflets. One final dose of warm retrograde "hot shot" cardioplegia was administered retrograde through the coronary sinus catheter while all air was evacuated through the aortic root.  The aortic  cross clamp was removed after a total cross clamp time of 156 minutes.  An Edwards [facility] 3 annuloplasty ring (size 34mm, model #4900, serial [id number])  is implanted uneventfully. After completion of the annuloplasty the valve was tested with saline and appears to be competent. The right atriotomy incision is closed using a 2 layer closure of running 4-0 Prolene suture.   Procedure Completion:  Epicardial pacing wires are fixed to the right ventricular outflow tract and to the right atrial appendage. The patient is rewarmed to 37C temperature. The aortic and left ventricular vents are removed.  The patient is weaned and disconnected from cardiopulmonary bypass.  The patient's rhythm at separation from bypass was AV paced.  The patient was weaned from cardioplegic bypass on low dose milrinone and dopamine infusions.  Soon after separation from bypass some dark blood is noted to be bleeding from the inferior wall of the right ventricle posteriorly. Cardiopulmonary bypass is resumed and this area is inspected. This appears to be bleeding in the fragile epicardial fat just beneath the AV groove. There is no sign of injury to the coronary sinus. This bleeding is controlled using a patch of autologous pericardium sewed circumferentially around the posterior surface of the right ventricle. The patient subsequently weaned and separated from cord a point bypass a second time without difficulty.  Total cardiopulmonary bypass time for the operation was 241 minutes.  Followup transesophageal echocardiogram performed after separation from bypass revealed a well-seated mitral valve prosthesis that was functioning normally and without any sign of perivalvular leak.  Left ventricular function was unchanged from preoperatively.  The tricuspid valve was difficult to visualize but there was no sign of any residual tricuspid regurgitation and the annuloplasty ring appeared to be well seated. No other abnormalities were noted.  The aortic cannula was removed uneventfully. Protamine was administered to reverse the anticoagulation. The femoral and right internal jugular venous  cannulae were removed and manual pressure held on the groin and neck for 30 minutes.  There was dissection of hematoma noted underneath the epicardial fat over the surface of the right ventricle that presumably started posteriorly.  Manual pressure was applied and no further surgical repair was necessary.   The mediastinum and pleural space were inspected for hemostasis and irrigated with saline solution. The mediastinum and both pleural spaces were drained using 4 chest tubes placed through separate stab incisions inferiorly.  The soft tissues anterior to the aorta were reapproximated loosely. The sternum is closed with double strength sternal wire. The soft tissues anterior to the sternum were closed in multiple layers and the skin is closed with a running subcuticular skin closure.  The post-bypass portion of the operation was notable for stable rhythm and hemodynamics.  The patient received 4 units packed red blood cells during the procedure due to anemia which was present preoperatively and exacerbated by acute blood loss and hemodilution during cardiopulmonary bypass.  The patient received a total of 2 packs adult platelets and 2 units fresh frozen plasma due to coagulopathy and thrombocytopenia after separation from cardiopulmonary bypass and reversal of heparin with protamine.   Patient Disposition:  The patient tolerated the procedure well and is transported to the surgical intensive care in stable condition. There are no intraoperative complications. All sponge instrument and needle counts are verified correct at completion of the operation.     Valentina Gu. Roxy Manns MD 04/25/2015 4:13 PM

## 2015-04-25 NOTE — Transfer of Care (Signed)
Immediate Anesthesia Transfer of Care Note  Patient: [name]  Procedure(s) Performed: Procedure(s): MITRAL VALVE  REPLACEMENT using a 27 mm Edwards Perimount Magna Mitral Ease Valve (N/A) TRICUSPID VALVE REPAIR (N/A) CORONARY ARTERY BYPASS GRAFTING (CABG) x ONE, using left internal mammary artery (N/A) MAZE (N/A) TRANSESOPHAGEAL ECHOCARDIOGRAM (TEE) (N/A) CLIPPING OF ATRIAL APPENDAGE (N/A)  Patient Location: SICU  Anesthesia Type:General  Level of Consciousness: sedated and Patient remains intubated per anesthesia plan  Airway & Oxygen Therapy: Patient remains intubated per anesthesia plan and Patient placed on Ventilator (see vital sign flow sheet for setting)  Post-op Assessment: Report given to RN and Post -op Vital signs reviewed and stable  Post vital signs: Reviewed and stable  Last Vitals:  Filed Vitals:   04/25/15 0553  BP: 178/43  Pulse: 53  Temp: 36.4 C  Resp: 18    Complications: No apparent anesthesia complications   Report given to ICU RN and all questions answered. Airway intact.   Garner Nash CRNA

## 2015-04-25 NOTE — Interval H&P Note (Signed)
History and Physical Interval Note:  04/25/2015 6:02 AM  [name]  has presented today for surgery, with the diagnosis of Mitral Regurgitation, Tricuspid Regurgitation, CAD, Atrial Fibrillation  The various methods of treatment have been discussed with the patient and family. After consideration of risks, benefits and other options for treatment, the patient has consented to  Procedure(s) with comments: MITRAL VALVE REPAIR (MVR) OR REPLACEMENT, TRICUSPID VALVE REPAIR, CABG AND MAZE PROCEDURE (N/A) - POSSIBLE MITRAL VALVE REPLACEMENT TRICUSPID VALVE REPAIR (N/A) CORONARY ARTERY BYPASS GRAFTING (CABG) (N/A) MAZE (N/A) TRANSESOPHAGEAL ECHOCARDIOGRAM (TEE) (N/A) as a surgical intervention .  The patient's history has been reviewed, patient examined, no change in status, stable for surgery.  I have reviewed the patient's chart and labs.  Questions were answered to the patient's satisfaction.     Rexene Alberts

## 2015-04-25 NOTE — OR Nursing (Signed)
15:44 - 20 minute call to SICU charge nurse

## 2015-04-25 NOTE — Anesthesia Postprocedure Evaluation (Signed)
  Anesthesia Post-op Note  Patient: [name]  Procedure(s) Performed: Procedure(s): MITRAL VALVE  REPLACEMENT using a 27 mm Edwards Perimount Magna Mitral Ease Valve (N/A) TRICUSPID VALVE REPAIR (N/A) CORONARY ARTERY BYPASS GRAFTING (CABG) x ONE, using left internal mammary artery (N/A) MAZE (N/A) TRANSESOPHAGEAL ECHOCARDIOGRAM (TEE) (N/A) CLIPPING OF ATRIAL APPENDAGE (N/A)  Patient Location: ICU  Anesthesia Type:General  Level of Consciousness: Patient remains intubated per anesthesia plan  Airway and Oxygen Therapy: Patient remains intubated per anesthesia plan  Post-op Pain: none  Post-op Assessment: Post-op Vital signs reviewed              Post-op Vital Signs: Reviewed  Last Vitals:  Filed Vitals:   04/25/15 1830  BP:   Pulse: 80  Temp: 37.2 C  Resp: 16    Complications: No apparent anesthesia complications

## 2015-04-25 NOTE — OR Nursing (Signed)
14:10 - Off pump call to SICU charge nurse

## 2015-04-25 NOTE — Anesthesia Procedure Notes (Addendum)
Anesthesia Procedure Note Risks, benefits, and alternatives discussed for central line placement. Patient agrees to procedure.  Patient positioned supine with mild trendelenburg.  Sterile prep and drape of Left neck (right IJ small, collapsible, distant compared to Left IJ).  Full barrier precautions taken.  1% injectable lidocaine placed SQ for topical anesthesia.  Central line placed with ultrasound guidance.  Appropriate blood return from catheter.  No air.  Sutured into place.  Sterile dressing applied.  No complications. Patient tolerated the procedure well. No signs of swelling or hematoma at neck site. Procedure Name: Intubation Date/Time: 04/25/2015 7:59 AM Performed by: Merdis Delay Pre-anesthesia Checklist: Patient identified, Emergency Drugs available, Suction available, Patient being monitored and Timeout performed Patient Re-evaluated:Patient Re-evaluated prior to inductionOxygen Delivery Method: Circle system utilized Preoxygenation: Pre-oxygenation with 100% oxygen Intubation Type: IV induction Ventilation: Mask ventilation without difficulty Laryngoscope Size: Mac and 3 Grade View: Grade II Tube size: 8.0 mm Number of attempts: 2 (esophageal intub x1- ETT removed) Airway Equipment and Method: Stylet Placement Confirmation: ETT inserted through vocal cords under direct vision,  breath sounds checked- equal and bilateral,  positive ETCO2 and CO2 detector Secured at: 22 cm Tube secured with: Tape Dental Injury: Teeth and Oropharynx as per pre-operative assessment  Comments: Limited neck mobility. Slight anterior angle.

## 2015-04-25 NOTE — OR Nursing (Signed)
09:25 - per Dr. Roxy Manns, called volunteer desk to notify family of surgery start.

## 2015-04-25 NOTE — Progress Notes (Signed)
Raid wean protocol initiated. Pt is now weaning. RN aware and pt is stable at this time and tolerating well.

## 2015-04-25 NOTE — Progress Notes (Signed)
CT surgery p.m. Rounds  Patient recently transferred to ICU following mitral valve replacement, tricuspid valve repair and Maze procedure. Patient hemodynamically stable on minimal inotropes being AV paced Minimal chest tube drainage Blood gases on admission were suboptimal and appropriate ventilator adjustments and made Laboratory values otherwise satisfactory.

## 2015-04-25 NOTE — Progress Notes (Signed)
Dr. Darcey Amal aware of gas labs; no changes to vent settings until shift change.  Rowe Pavy, RN

## 2015-04-26 ENCOUNTER — Encounter (HOSPITAL_COMMUNITY): Payer: Self-pay | Admitting: Thoracic Surgery (Cardiothoracic Vascular Surgery)

## 2015-04-26 ENCOUNTER — Inpatient Hospital Stay (HOSPITAL_COMMUNITY): Payer: Medicare PPO

## 2015-04-26 LAB — CBC
HCT: 32.5 % — ABNORMAL LOW (ref 36.0–46.0)
HEMATOCRIT: 30 % — AB (ref 36.0–46.0)
HEMOGLOBIN: 10.9 g/dL — AB (ref 12.0–15.0)
HEMOGLOBIN: 10.1 g/dL — AB (ref 12.0–15.0)
MCH: 29.9 pg (ref 26.0–34.0)
MCH: 29.9 pg (ref 26.0–34.0)
MCHC: 33.5 g/dL (ref 30.0–36.0)
MCHC: 33.7 g/dL (ref 30.0–36.0)
MCV: 89 fL (ref 78.0–100.0)
MCV: 88.8 fL (ref 78.0–100.0)
PLATELETS: 117 10*3/uL — AB (ref 150–400)
Platelets: 83 10*3/uL — ABNORMAL LOW (ref 150–400)
RBC: 3.65 MIL/uL — AB (ref 3.87–5.11)
RBC: 3.38 MIL/uL — ABNORMAL LOW (ref 3.87–5.11)
RDW: 17.7 % — ABNORMAL HIGH (ref 11.5–15.5)
RDW: 18.3 % — ABNORMAL HIGH (ref 11.5–15.5)
WBC: 17.7 10*3/uL — AB (ref 4.0–10.5)
WBC: 16.3 10*3/uL — ABNORMAL HIGH (ref 4.0–10.5)

## 2015-04-26 LAB — PREPARE FRESH FROZEN PLASMA

## 2015-04-26 LAB — POCT I-STAT 3, ART BLOOD GAS (G3+)
ACID-BASE DEFICIT: 3 mmol/L — AB (ref 0.0–2.0)
ACID-BASE DEFICIT: 4 mmol/L — AB (ref 0.0–2.0)
ACID-BASE DEFICIT: 4 mmol/L — AB (ref 0.0–2.0)
ACID-BASE EXCESS: 1 mmol/L (ref 0.0–2.0)
BICARBONATE: 22.1 meq/L (ref 20.0–24.0)
Bicarbonate: 22.4 mEq/L (ref 20.0–24.0)
Bicarbonate: 21.9 mEq/L (ref 20.0–24.0)
Bicarbonate: 25.9 mEq/L — ABNORMAL HIGH (ref 20.0–24.0)
O2 SAT: 88 %
O2 SAT: 91 %
O2 SAT: 91 %
O2 SAT: 94 %
PCO2 ART: 42.7 mmHg (ref 35.0–45.0)
PH ART: 7.305 — AB (ref 7.350–7.450)
PH ART: 7.308 — AB (ref 7.350–7.450)
PO2 ART: 66 mmHg — AB (ref 80.0–100.0)
PO2 ART: 66 mmHg — AB (ref 80.0–100.0)
PO2 ART: 75 mmHg — AB (ref 80.0–100.0)
Patient temperature: 98.4
Patient temperature: 97.9
TCO2: 24 mmol/L (ref 0–100)
TCO2: 23 mmol/L (ref 0–100)
TCO2: 23 mmol/L (ref 0–100)
TCO2: 27 mmol/L (ref 0–100)
pCO2 arterial: 44.9 mmHg (ref 35.0–45.0)
pCO2 arterial: 43.6 mmHg (ref 35.0–45.0)
pCO2 arterial: 41.4 mmHg (ref 35.0–45.0)
pH, Arterial: 7.336 — ABNORMAL LOW (ref 7.350–7.450)
pH, Arterial: 7.391 (ref 7.350–7.450)
pO2, Arterial: 55 mmHg — ABNORMAL LOW (ref 80.0–100.0)

## 2015-04-26 LAB — GLUCOSE, CAPILLARY
GLUCOSE-CAPILLARY: 105 mg/dL — AB (ref 65–99)
GLUCOSE-CAPILLARY: 99 mg/dL (ref 65–99)
GLUCOSE-CAPILLARY: 148 mg/dL — AB (ref 65–99)
GLUCOSE-CAPILLARY: 146 mg/dL — AB (ref 65–99)
GLUCOSE-CAPILLARY: 146 mg/dL — AB (ref 65–99)
Glucose-Capillary: 132 mg/dL — ABNORMAL HIGH (ref 65–99)
Glucose-Capillary: 145 mg/dL — ABNORMAL HIGH (ref 65–99)
Glucose-Capillary: 166 mg/dL — ABNORMAL HIGH (ref 65–99)
Glucose-Capillary: 99 mg/dL (ref 65–99)

## 2015-04-26 LAB — PREPARE PLATELET PHERESIS
UNIT DIVISION: 0
Unit division: 0

## 2015-04-26 LAB — BASIC METABOLIC PANEL
ANION GAP: 8 (ref 5–15)
BUN: 11 mg/dL (ref 6–20)
CHLORIDE: 113 mmol/L — AB (ref 101–111)
CO2: 22 mmol/L (ref 22–32)
Calcium: 8.1 mg/dL — ABNORMAL LOW (ref 8.9–10.3)
Creatinine, Ser: 0.7 mg/dL (ref 0.44–1.00)
Glucose, Bld: 155 mg/dL — ABNORMAL HIGH (ref 65–99)
POTASSIUM: 4.2 mmol/L (ref 3.5–5.1)
SODIUM: 143 mmol/L (ref 135–145)

## 2015-04-26 LAB — MAGNESIUM
MAGNESIUM: 2.6 mg/dL — AB (ref 1.7–2.4)
Magnesium: 2.5 mg/dL — ABNORMAL HIGH (ref 1.7–2.4)

## 2015-04-26 LAB — POCT I-STAT, CHEM 8
BUN: 14 mg/dL (ref 6–20)
CALCIUM ION: 1.22 mmol/L (ref 1.13–1.30)
Chloride: 105 mmol/L (ref 101–111)
Creatinine, Ser: 0.7 mg/dL (ref 0.44–1.00)
GLUCOSE: 162 mg/dL — AB (ref 65–99)
HCT: 29 % — ABNORMAL LOW (ref 36.0–46.0)
HEMOGLOBIN: 9.9 g/dL — AB (ref 12.0–15.0)
POTASSIUM: 3.9 mmol/L (ref 3.5–5.1)
Sodium: 144 mmol/L (ref 135–145)
TCO2: 23 mmol/L (ref 0–100)

## 2015-04-26 LAB — CREATININE, SERUM
Creatinine, Ser: 0.73 mg/dL (ref 0.44–1.00)
GFR calc Af Amer: >60 mL/min (ref >60)
GFR calc non Af Amer: >60 mL/min (ref >60)

## 2015-04-26 MED ORDER — SODIUM BICARBONATE 8.4 % IV SOLN
50.0000 meq | Freq: Once | INTRAVENOUS | Status: AC
  Administered 2015-04-26: 50 meq via INTRAVENOUS
  Filled 2015-04-26: qty 50

## 2015-04-26 MED ORDER — FUROSEMIDE 10 MG/ML IJ SOLN
40.0000 mg | Freq: Once | INTRAMUSCULAR | Status: AC
  Administered 2015-04-26: 40 mg via INTRAVENOUS
  Filled 2015-04-26: qty 4

## 2015-04-26 MED ORDER — LACTATED RINGERS IV SOLN: INTRAVENOUS | Status: DC

## 2015-04-26 MED ORDER — SODIUM CHLORIDE 0.45 % IV SOLN
INTRAVENOUS | Status: DC
  Administered 2015-04-26: 20:00:00 via INTRAVENOUS

## 2015-04-26 MED ORDER — INSULIN ASPART 100 UNIT/ML ~~LOC~~ SOLN
0.0000 [IU] | SUBCUTANEOUS | Status: DC
  Administered 2015-04-26 (×4): 2 [IU] via SUBCUTANEOUS
  Administered 2015-04-26: 4 [IU] via SUBCUTANEOUS
  Administered 2015-04-27 – 2015-04-28 (×8): 2 [IU] via SUBCUTANEOUS

## 2015-04-26 MED FILL — Sodium Chloride IV Soln 0.9%: INTRAVENOUS | Qty: 2000 | Status: AC

## 2015-04-26 MED FILL — Heparin Sodium (Porcine) Inj 1000 Unit/ML: INTRAMUSCULAR | Qty: 30 | Status: AC

## 2015-04-26 MED FILL — Mannitol IV Soln 20%: INTRAVENOUS | Qty: 500 | Status: AC

## 2015-04-26 MED FILL — Heparin Sodium (Porcine) Inj 1000 Unit/ML: INTRAMUSCULAR | Qty: 10 | Status: AC

## 2015-04-26 MED FILL — Lidocaine HCl IV Inj 20 MG/ML: INTRAVENOUS | Qty: 5 | Status: AC

## 2015-04-26 MED FILL — Electrolyte-R (PH 7.4) Solution: INTRAVENOUS | Qty: 3000 | Status: AC

## 2015-04-26 MED FILL — Sodium Bicarbonate IV Soln 8.4%: INTRAVENOUS | Qty: 50 | Status: AC

## 2015-04-26 NOTE — Progress Notes (Signed)
According to the Blood Gas pt didn't pass rapid weaning trail due to ZFP/OI51 being out of range. Weaning trail restarted. Pt is now on SIMV Rate 12 50%.  Pt is stable and following commands. RN aware

## 2015-04-26 NOTE — Progress Notes (Signed)
      [address]       [place] [address]             [phone]      POD # 1 MVR, CABG x 1, maze  BP 118/47 mmHg  Pulse 80  Temp(Src) 97.7 F (36.5 C) (Axillary)  Resp 19  Ht 5\' 2"  (1.575 m)  Wt 167 lb 1.7 oz (75.8 kg)  BMI 30.56 kg/m2  SpO2 99%   Intake/Output Summary (Last 24 hours) at 04/26/15 1851 Last data filed at 04/26/15 1700  Gross per 24 hour  Intake 3124.4 ml  Output   2155 ml  Net  969.4 ml    CBG 140-160 K= 3.9, creatinine 0.73 Hct= 30  Anthonyjames Bargar C. Roxan Hockey, MD [facility] [phone]

## 2015-04-26 NOTE — Progress Notes (Signed)
Pt failed weaning parameters. ABG failed and respiratory mechanics. NIF-26 which is good, VC 335ml. Pt expiratory strength is very weak and shallow. Tried twice on her VC still the same result. 323ml, 3110ml. Her best effort was the 2nd trail..  Also when doing the trails pt immediately desated to the mid 80's 86% sustained until place back on MV. RN aware of desaturation.   Pt place back on full support and is now stable. RN bedside

## 2015-04-26 NOTE — Procedures (Signed)
Extubation Procedure Note  Patient Details:   Name: ADDALIE CALLES DOB: 08-Oct-1934 MRN: [id number]   Airway Documentation: NIF -32 cmh20, VC 800 cc, RR 19, VT 422, sat 96%.  Pt had audibloe cuff leak prior to extubation.  Placed on 4lpm [place] and sat 96%, HR 80, RR 20, BP 113/42.  Pt able to say name, asking for ice chips, etc.  Will continue to monitor.    Evaluation  O2 sats: stable throughout Complications: No apparent complications Patient did tolerate procedure well. Bilateral Breath Sounds: Clear, Diminished Suctioning: Oral, Airway Yes  Ned Grace 04/26/2015, 9:09 AM

## 2015-04-26 NOTE — Progress Notes (Signed)
Re-attempted wean.  Pt awake and alert.  Pt failed ABG d/t sO2 91.  (pH 7.31 pCO2 43.6 pO2 66 bicarb 21.9).  Pt also failed mechanics VC 350.  RT placed pt back on full support.  Pt now resting comfortably.  Vista Lawman, RN

## 2015-04-26 NOTE — Progress Notes (Signed)
NIF -32cmh20, VC 800cc, RR 10, spont VT 422.

## 2015-04-26 NOTE — Plan of Care (Signed)
PO2 55 on 4L O2, increased to 6L. Will monitor.

## 2015-04-26 NOTE — Progress Notes (Signed)
[address]       [place] [address]             [phone]        CARDIOTHORACIC SURGERY PROGRESS NOTE   R1 Day Post-Op Procedure(s) (LRB): MITRAL VALVE  REPLACEMENT using a 27 mm Edwards Perimount Magna Mitral Ease Valve (N/A) TRICUSPID VALVE REPAIR (N/A) CORONARY ARTERY BYPASS GRAFTING (CABG) x ONE, using left internal mammary artery (N/A) MAZE (N/A) TRANSESOPHAGEAL ECHOCARDIOGRAM (TEE) (N/A) CLIPPING OF ATRIAL APPENDAGE (N/A)  Subjective: Looks good although still on vent.  Wide awake.  Comfortable.   Objective: Vital signs: BP Readings from Last 1 Encounters:  04/26/15 97/48   Pulse Readings from Last 1 Encounters:  04/26/15 80   Resp Readings from Last 1 Encounters:  04/26/15 18   Temp Readings from Last 1 Encounters:  04/26/15 98.2 F (36.8 C)     Hemodynamics: CVP:  [8 mmHg-16 mmHg] 11 mmHg  Physical Exam:  Rhythm:   Junctional 45-50 AV pacing  Breath sounds: clear  Heart sounds:  RRR  Incisions:  Dressing dry, intact  Abdomen:  Soft, non-distended, non-tender  Extremities:  Warm, well-perfused  Chest tubes:  Low volume thin serosanguinous output, no air leak     Intake/Output from previous day: 08/16 0701 - 08/17 0700 In: 9040 [I.V.:4987; Blood:2503; IV Piggyback:1550] Out: 3700 [Urine:2880; Emesis/NG output:200; Chest Tube:620] Intake/Output this shift:    Lab Results:  CBC: Recent Labs  04/25/15 2300 04/25/15 2307 04/26/15 0350  WBC 16.5*  --  17.7*  HGB 12.4 11.9* 10.9*  HCT 36.5 35.0* 32.5*  PLT 122*  --  117*    BMET:  Recent Labs  04/25/15 2307 04/26/15 0350  NA 146* 143  K 3.8 4.2  CL 108 113*  CO2  --  22  GLUCOSE 97 155*  BUN 12 11  CREATININE 0.60 0.70  CALCIUM  --  8.1*     PT/INR:   Recent Labs  04/25/15 1645  LABPROT 19.4*  INR 1.63*    CBG (last 3)   Recent Labs  04/26/15 0002 04/26/15 0104 04/26/15 0349  GLUCAP 99 99 145*    ABG    Component Value Date/Time   PHART 7.308* 04/26/2015 0351   PCO2ART 43.6 04/26/2015 0351   PO2ART 66.0* 04/26/2015 0351   HCO3 21.9 04/26/2015 0351   TCO2 23 04/26/2015 0351   ACIDBASEDEF 4.0* 04/26/2015 0351   O2SAT 91.0 04/26/2015 0351    CXR: PORTABLE CHEST - 1 VIEW  COMPARISON: 04/25/2015  FINDINGS: Endotracheal tube terminates 4.0 cm above carina. Nasogastric tube is poorly visualized distally but likely extends beyond the inferior aspect of the film. Mediastinal drain. Left chest tube unchanged. Left IJ Cordis sheath. Patient rotated to the right. Prior median sternotomy. Atrial appendage occlusion device. Mitral valve repair. Right-sided chest tube remains. Left subclavian Swan-Ganz catheter has been removed with a Cordis sheath remaining in place. The sheath is likely kinked at the skin surface. Cardiomegaly accentuated by AP portable technique. Possible trace right pleural fluid. No pneumothorax. Improvement to resolution of interstitial edema. Left base atelectasis.  IMPRESSION: Extensive support apparatus, as detailed above. The left subclavian Cordis sheath is likely kinked at the skin surface.  Cardiomegaly with improvement to resolution of interstitial edema.  No pneumothorax.  Suspect developing left base atelectasis.   Electronically Signed  By: Abigail Miyamoto M.D.  On: 04/26/2015 07:46  Assessment/Plan: S/P Procedure(s) (LRB): MITRAL VALVE  REPLACEMENT using a 27 mm Oletta Lamas  Perimount Magna Mitral Ease Valve (N/A) TRICUSPID VALVE REPAIR (N/A) CORONARY ARTERY BYPASS GRAFTING (CABG) x ONE, using left internal mammary artery (N/A) MAZE (N/A) TRANSESOPHAGEAL ECHOCARDIOGRAM (TEE) (N/A) CLIPPING OF ATRIAL APPENDAGE (N/A)  Overall stable POD1 AV paced rhythm w/ stable hemodynamics on milrinone @ 0.2 dopamine @3  and low dose neo drips, CVP relatively low Failed parameters for extubation last night - currently looks good and likely ready for extubation Expected post op acute  blood loss anemia, mild, stable Expected post op volume excess, mild Chronic combined systolic and diastolic CHF w/ underlying rheumatic heart disease   Wean vent and extubate  Mobilize post extubation  Wean Neo as tolerated  Wean milrinone and dopamine once stable off vent  Hold diuretics this morning    Rexene Alberts 04/26/2015 8:04 AM

## 2015-04-26 NOTE — Progress Notes (Signed)
RN has decided to let pt rest and retry weaning a little later. Pt is stable at this time. RT will continue to monitor.

## 2015-04-27 ENCOUNTER — Inpatient Hospital Stay (HOSPITAL_COMMUNITY): Payer: Medicare PPO

## 2015-04-27 LAB — GLUCOSE, CAPILLARY
GLUCOSE-CAPILLARY: 130 mg/dL — AB (ref 65–99)
GLUCOSE-CAPILLARY: 130 mg/dL — AB (ref 65–99)
GLUCOSE-CAPILLARY: 134 mg/dL — AB (ref 65–99)
GLUCOSE-CAPILLARY: 144 mg/dL — AB (ref 65–99)
Glucose-Capillary: 134 mg/dL — ABNORMAL HIGH (ref 65–99)
Glucose-Capillary: 141 mg/dL — ABNORMAL HIGH (ref 65–99)

## 2015-04-27 LAB — BASIC METABOLIC PANEL
Anion gap: 8 (ref 5–15)
BUN: 18 mg/dL (ref 6–20)
CALCIUM: 8.5 mg/dL — AB (ref 8.9–10.3)
CO2: 25 mmol/L (ref 22–32)
CREATININE: 1.03 mg/dL — AB (ref 0.44–1.00)
Chloride: 110 mmol/L (ref 101–111)
GFR calc Af Amer: 59 mL/min — ABNORMAL LOW (ref >60)
GFR calc non Af Amer: 51 mL/min — ABNORMAL LOW (ref >60)
GLUCOSE: 149 mg/dL — AB (ref 65–99)
Potassium: 4.1 mmol/L (ref 3.5–5.1)
Sodium: 143 mmol/L (ref 135–145)

## 2015-04-27 LAB — CBC
HEMATOCRIT: 30.3 % — AB (ref 36.0–46.0)
Hemoglobin: 10 g/dL — ABNORMAL LOW (ref 12.0–15.0)
MCH: 29.6 pg (ref 26.0–34.0)
MCHC: 33 g/dL (ref 30.0–36.0)
MCV: 89.6 fL (ref 78.0–100.0)
Platelets: 87 10*3/uL — ABNORMAL LOW (ref 150–400)
RBC: 3.38 MIL/uL — ABNORMAL LOW (ref 3.87–5.11)
RDW: 18.6 % — AB (ref 11.5–15.5)
WBC: 17.9 10*3/uL — AB (ref 4.0–10.5)

## 2015-04-27 LAB — CARBOXYHEMOGLOBIN
Carboxyhemoglobin: 1.6 % — ABNORMAL HIGH (ref 0.5–1.5)
Methemoglobin: 1.3 % (ref 0.0–1.5)
O2 Saturation: 68.5 %
Total hemoglobin: 8.3 g/dL — ABNORMAL LOW (ref 12.0–16.0)

## 2015-04-27 MED ORDER — MORPHINE SULFATE (PF) 2 MG/ML IV SOLN: 2.0000 mg | INTRAVENOUS | Status: DC | PRN

## 2015-04-27 MED ORDER — FUROSEMIDE 10 MG/ML IJ SOLN
40.0000 mg | Freq: Four times a day (QID) | INTRAMUSCULAR | Status: DC
  Administered 2015-04-27: 40 mg via INTRAVENOUS
  Filled 2015-04-27: qty 4

## 2015-04-27 MED ORDER — DOPAMINE-DEXTROSE 3.2-5 MG/ML-% IV SOLN
3.0000 ug/kg/min | INTRAVENOUS | Status: DC
  Administered 2015-04-27 – 2015-04-29 (×2): 3 ug/kg/min via INTRAVENOUS
  Filled 2015-04-27: qty 250

## 2015-04-27 MED ORDER — DOPAMINE-DEXTROSE 3.2-5 MG/ML-% IV SOLN
INTRAVENOUS | Status: AC
  Filled 2015-04-27: qty 250

## 2015-04-27 MED ORDER — FUROSEMIDE 10 MG/ML IJ SOLN
8.0000 mg/h | INTRAVENOUS | Status: DC
  Administered 2015-04-27 – 2015-04-29 (×2): 10 mg/h via INTRAVENOUS
  Filled 2015-04-27 (×5): qty 25

## 2015-04-27 MED ORDER — FUROSEMIDE 10 MG/ML IJ SOLN
INTRAMUSCULAR | Status: AC
  Filled 2015-04-27: qty 2

## 2015-04-27 MED ORDER — FUROSEMIDE 10 MG/ML IJ SOLN
20.0000 mg | Freq: Four times a day (QID) | INTRAMUSCULAR | Status: DC
Start: 2015-04-27 — End: 2015-04-27

## 2015-04-27 MED FILL — Potassium Chloride Inj 2 mEq/ML: INTRAVENOUS | Qty: 40 | Status: AC

## 2015-04-27 MED FILL — Magnesium Sulfate Inj 50%: INTRAMUSCULAR | Qty: 10 | Status: AC

## 2015-04-27 MED FILL — Heparin Sodium (Porcine) Inj 1000 Unit/ML: INTRAMUSCULAR | Qty: 30 | Status: AC

## 2015-04-27 NOTE — Progress Notes (Signed)
Patient ID: [name], female   DOB: [birth year], 80 y.o.   MRN: [id number] EVENING ROUNDS NOTE :     [address]       [place] [address]             [phone]                 2 Days Post-Op Procedure(s) (LRB): MITRAL VALVE  REPLACEMENT using a 27 mm Edwards Perimount Magna Mitral Ease Valve (N/A) TRICUSPID VALVE REPAIR (N/A) CORONARY ARTERY BYPASS GRAFTING (CABG) x ONE, using left internal mammary artery (N/A) MAZE (N/A) TRANSESOPHAGEAL ECHOCARDIOGRAM (TEE) (N/A) CLIPPING OF ATRIAL APPENDAGE (N/A)  Total Length of Stay:  LOS: 2 days  BP 127/51 mmHg  Pulse 80  Temp(Src) 98.4 F (36.9 C) (Oral)  Resp 13  Ht 5\' 2"  (1.575 m)  Wt 160 lb 7.9 oz (72.8 kg)  BMI 29.35 kg/m2  SpO2 98%  .Intake/Output      08/17 0701 - 08/18 0700 08/18 0701 - 08/19 0700   P.O. 660 120   I.V. (mL/kg) 1010.8 (13.9) 121.6 (1.7)   Blood     IV Piggyback     Total Intake(mL/kg) 1670.8 (23) 241.6 (3.3)   Urine (mL/kg/hr) 870 (0.5) 560 (0.6)   Emesis/NG output     Chest Tube 950 (0.5) 432 (0.5)   Total Output 1820 992   Net -149.2 -750.4          . sodium chloride 20 mL/hr at 04/26/15 2005  . sodium chloride    . DOPamine 3 mcg/kg/min (04/27/15 1800)  . furosemide (LASIX) infusion 10 mg/hr (04/27/15 1800)  . milrinone 0.1 mcg/kg/min (04/27/15 1800)     Lab Results  Component Value Date   WBC 17.9* 04/27/2015   HGB 10.0* 04/27/2015   HCT 30.3* 04/27/2015   PLT 87* 04/27/2015   GLUCOSE 149* 04/27/2015   ALT 23 04/21/2015   AST 25 04/21/2015   NA 143 04/27/2015   K 4.1 04/27/2015   CL 110 04/27/2015   CREATININE 1.03* 04/27/2015   BUN 18 04/27/2015   CO2 25 04/27/2015   INR 1.63* 04/25/2015   HGBA1C 6.3* 04/21/2015   Paced On milrinone, dopamine  and lasix drip Stable  Grace Isaac MD  Beeper [phone] Office [phone] 04/27/2015 6:57 PM

## 2015-04-27 NOTE — Progress Notes (Signed)
[address]       [place] [address]             [phone]        CARDIOTHORACIC SURGERY PROGRESS NOTE   R2 Days Post-Op Procedure(s) (LRB): MITRAL VALVE  REPLACEMENT using a 27 mm Edwards Perimount Magna Mitral Ease Valve (N/A) TRICUSPID VALVE REPAIR (N/A) CORONARY ARTERY BYPASS GRAFTING (CABG) x ONE, using left internal mammary artery (N/A) MAZE (N/A) TRANSESOPHAGEAL ECHOCARDIOGRAM (TEE) (N/A) CLIPPING OF ATRIAL APPENDAGE (N/A)  Subjective: Feels a little SOB but otherwise okay.  Mild soreness in chest  Objective: Vital signs: BP Readings from Last 1 Encounters:  04/27/15 105/43   Pulse Readings from Last 1 Encounters:  04/27/15 80   Resp Readings from Last 1 Encounters:  04/27/15 13   Temp Readings from Last 1 Encounters:  04/27/15 98.5 F (36.9 C) Oral    Hemodynamics: CVP:  [0 mmHg-33 mmHg] 7 mmHg  Physical Exam:  Rhythm:   3rd degree AV block w/ narrow junctional escape, HR 50 - DDD pacing  Breath sounds: Few crackles  Heart sounds:  RRR  Incisions:  Dressing dry, intact  Abdomen:  Soft, non-distended, non-tender  Extremities:  Warm, well-perfused, swollen  Chest tubes:  Low volume thin serosanguinous output but pleural tubes still draining, no air leak    Intake/Output from previous day: 08/17 0701 - 08/18 0700 In: 1670.8 [P.O.:660; I.V.:1010.8] Out: 1820 [Urine:870; Chest Tube:950] Intake/Output this shift: Total I/O In: -  Out: 85 [Urine:35; Chest Tube:50]  Lab Results:  CBC: Recent Labs  04/26/15 1701 04/27/15 0435  WBC 16.3* 17.9*  HGB 10.1* 10.0*  HCT 30.0* 30.3*  PLT 83* 87*    BMET:  Recent Labs  04/26/15 0350 04/26/15 1659 04/26/15 1701 04/27/15 0435  NA 143 144  --  143  K 4.2 3.9  --  4.1  CL 113* 105  --  110  CO2 22  --   --  25  GLUCOSE 155* 162*  --  149*  BUN 11 14  --  18  CREATININE 0.70 0.70 0.73 1.03*  CALCIUM 8.1*  --   --  8.5*     PT/INR:   Recent Labs  04/25/15 1645    LABPROT 19.4*  INR 1.63*    CBG (last 3)   Recent Labs  04/26/15 1917 04/26/15 2316 04/27/15 0350  GLUCAP 146* 132* 144*    ABG    Component Value Date/Time   PHART 7.391 04/26/2015 0952   PCO2ART 42.7 04/26/2015 0952   PO2ART 55.0* 04/26/2015 0952   HCO3 25.9* 04/26/2015 0952   TCO2 23 04/26/2015 1659   ACIDBASEDEF 3.0* 04/26/2015 0832   O2SAT 88.0 04/26/2015 0952    CXR: PORTABLE CHEST - 1 VIEW  COMPARISON: Portable chest x-ray of April 26, 2015  FINDINGS: There has been interval extubation of the trachea and of the esophagus. Again demonstrated is a sharp kink in the left subclavian Cordis sheath likely at the site of the entrance into the skin. The mediastinal drain and the 2 left chest tubes are stable. There is no pneumothorax. There may be a trace of pleural fluid at the lung base. The cardiac silhouette remains enlarged. There are 8 intact sternal wires. The left atrial appendage clip is stable. There is a stable nodule peripherally in the right lower lung.  IMPRESSION: 1. Interval development of left lower lobe atelectasis. A small left pleural effusion is suspected. The mediastinal drain and  left chest tubes are unchanged in position. There is no pneumothorax. 2. Stable enlargement of the cardiac silhouette with mild central pulmonary vascular prominence. 3. Persistent kink in the left subclavian Cordis sheath likely at the scan entrance site.   Electronically Signed  By: David Martinique M.D.  On: 04/27/2015 07:19   Assessment/Plan: S/P Procedure(s) (LRB): MITRAL VALVE  REPLACEMENT using a 27 mm Edwards Perimount Magna Mitral Ease Valve (N/A) TRICUSPID VALVE REPAIR (N/A) CORONARY ARTERY BYPASS GRAFTING (CABG) x ONE, using left internal mammary artery (N/A) MAZE (N/A) TRANSESOPHAGEAL ECHOCARDIOGRAM (TEE) (N/A) CLIPPING OF ATRIAL APPENDAGE (N/A)  Overall stable POD2 DDD pacing w/ 3rd degree AV block underneath Maintaining stable  hemodynamics off Neo and Dopamine drips on low dose milrinone Expected post op atelectasis, O2 sats stable 96% on 5 L/min via [place] Expected post op volume excess, moderate - not diuresing much yet Expected post op acute blood loss anemia, stable Post op thrombocytopenia, mild, stable   Mobilize  D/C mediastinal tubes but leave pleural tubes in for now  Lasix drip to stimulate diuresis  Keep foley in place to monitor UOP  Watch thrombocytopenia  Keep in SICU for now   Rexene Alberts 04/27/2015 8:44 AM

## 2015-04-28 ENCOUNTER — Inpatient Hospital Stay (HOSPITAL_COMMUNITY): Payer: Medicare PPO

## 2015-04-28 LAB — GLUCOSE, CAPILLARY
Glucose-Capillary: 108 mg/dL — ABNORMAL HIGH (ref 65–99)
Glucose-Capillary: 121 mg/dL — ABNORMAL HIGH (ref 65–99)

## 2015-04-28 LAB — COMPREHENSIVE METABOLIC PANEL
ALT: 21 U/L (ref 14–54)
AST: 45 U/L — AB (ref 15–41)
Albumin: 2.7 g/dL — ABNORMAL LOW (ref 3.5–5.0)
Alkaline Phosphatase: 45 U/L (ref 38–126)
Anion gap: 9 (ref 5–15)
BILIRUBIN TOTAL: 0.9 mg/dL (ref 0.3–1.2)
BUN: 19 mg/dL (ref 6–20)
CHLORIDE: 101 mmol/L (ref 101–111)
CO2: 29 mmol/L (ref 22–32)
CREATININE: 0.81 mg/dL (ref 0.44–1.00)
Calcium: 8.3 mg/dL — ABNORMAL LOW (ref 8.9–10.3)
GFR calc Af Amer: >60 mL/min (ref >60)
GLUCOSE: 126 mg/dL — AB (ref 65–99)
Potassium: 3.1 mmol/L — ABNORMAL LOW (ref 3.5–5.1)
Sodium: 139 mmol/L (ref 135–145)
Total Protein: 5.2 g/dL — ABNORMAL LOW (ref 6.5–8.1)

## 2015-04-28 LAB — CBC
HEMATOCRIT: 29.9 % — AB (ref 36.0–46.0)
Hemoglobin: 9.7 g/dL — ABNORMAL LOW (ref 12.0–15.0)
MCH: 29.6 pg (ref 26.0–34.0)
MCHC: 32.4 g/dL (ref 30.0–36.0)
MCV: 91.2 fL (ref 78.0–100.0)
PLATELETS: 91 10*3/uL — AB (ref 150–400)
RBC: 3.28 MIL/uL — AB (ref 3.87–5.11)
RDW: 18.4 % — ABNORMAL HIGH (ref 11.5–15.5)
WBC: 13.9 10*3/uL — AB (ref 4.0–10.5)

## 2015-04-28 LAB — CARBOXYHEMOGLOBIN
Carboxyhemoglobin: 1.5 % (ref 0.5–1.5)
Methemoglobin: 1 % (ref 0.0–1.5)
O2 Saturation: 65.3 %
TOTAL HEMOGLOBIN: 9.8 g/dL — AB (ref 12.0–16.0)

## 2015-04-28 MED ORDER — SODIUM CHLORIDE 0.9 % IJ SOLN
3.0000 mL | Freq: Two times a day (BID) | INTRAMUSCULAR | Status: DC
  Administered 2015-04-28: 3 mL via INTRAVENOUS

## 2015-04-28 MED ORDER — IPRATROPIUM-ALBUTEROL 0.5-2.5 (3) MG/3ML IN SOLN
3.0000 mL | Freq: Four times a day (QID) | RESPIRATORY_TRACT | Status: DC | PRN
  Administered 2015-05-03: 3 mL via RESPIRATORY_TRACT
  Filled 2015-04-28: qty 3

## 2015-04-28 MED ORDER — SODIUM CHLORIDE 0.9 % IJ SOLN: 3.0000 mL | INTRAMUSCULAR | Status: DC | PRN

## 2015-04-28 MED ORDER — PANTOPRAZOLE SODIUM 40 MG PO TBEC
40.0000 mg | DELAYED_RELEASE_TABLET | Freq: Every day | ORAL | Status: DC
  Administered 2015-04-28 – 2015-05-11 (×14): 40 mg via ORAL
  Filled 2015-04-28 (×14): qty 1

## 2015-04-28 MED ORDER — MOVING RIGHT ALONG BOOK
Freq: Once | Status: AC
  Administered 2015-04-28: 08:00:00
  Filled 2015-04-28: qty 1

## 2015-04-28 MED ORDER — POTASSIUM CHLORIDE 10 MEQ/50ML IV SOLN
10.0000 meq | INTRAVENOUS | Status: AC
  Administered 2015-04-28 (×3): 10 meq via INTRAVENOUS

## 2015-04-28 MED ORDER — WARFARIN - PHYSICIAN DOSING INPATIENT
Freq: Every day | Status: DC
  Administered 2015-04-30 – 2015-05-09 (×9)

## 2015-04-28 MED ORDER — ENOXAPARIN SODIUM 30 MG/0.3ML ~~LOC~~ SOLN
30.0000 mg | Freq: Every day | SUBCUTANEOUS | Status: DC
  Administered 2015-04-28 – 2015-05-01 (×4): 30 mg via SUBCUTANEOUS
  Filled 2015-04-28 (×4): qty 0.3

## 2015-04-28 MED ORDER — SPIRONOLACTONE 25 MG PO TABS
25.0000 mg | ORAL_TABLET | Freq: Every day | ORAL | Status: DC
  Administered 2015-04-28 – 2015-05-11 (×14): 25 mg via ORAL
  Filled 2015-04-28 (×15): qty 1

## 2015-04-28 MED ORDER — SODIUM CHLORIDE 0.9 % IV SOLN: 250.0000 mL | INTRAVENOUS | Status: DC | PRN

## 2015-04-28 MED ORDER — POTASSIUM CHLORIDE 10 MEQ/50ML IV SOLN
10.0000 meq | INTRAVENOUS | Status: AC
  Administered 2015-04-28 – 2015-04-29 (×4): 10 meq via INTRAVENOUS
  Filled 2015-04-28 (×2): qty 50

## 2015-04-28 MED ORDER — WARFARIN SODIUM 1 MG PO TABS
2.0000 mg | ORAL_TABLET | Freq: Every day | ORAL | Status: DC
  Administered 2015-04-28 – 2015-05-05 (×8): 2 mg via ORAL
  Filled 2015-04-28: qty 2
  Filled 2015-04-28: qty 1
  Filled 2015-04-28: qty 2
  Filled 2015-04-28 (×3): qty 1
  Filled 2015-04-28: qty 2
  Filled 2015-04-28: qty 1
  Filled 2015-04-28: qty 2
  Filled 2015-04-28: qty 1
  Filled 2015-04-28: qty 2

## 2015-04-28 MED ORDER — METOCLOPRAMIDE HCL 5 MG/ML IJ SOLN
10.0000 mg | Freq: Four times a day (QID) | INTRAMUSCULAR | Status: DC
  Administered 2015-04-28 – 2015-05-04 (×22): 10 mg via INTRAVENOUS
  Filled 2015-04-28 (×28): qty 2

## 2015-04-28 NOTE — Progress Notes (Signed)
Occupational Therapy Treatment Patient Details Name: [name] MRN: [id number] DOB: [birth year] Today's Date: 04/28/2015    History of present illness Adm 04/25/15 for MVR, tricuspid valve repair, CABG x 1, and clipping of atrial appendage. Post-op hypotension requiring pressors and N/V PMHx- COPD (home O2), CHF, [place], HTN, Rt breast Ca; bil cochlear implants   OT comments  Pt was independent prior to admission, but reports being unsteady when ambulating although not using AD.  Pt presents with nausea interfering with ability to participate in evaluation.  Expect pt will do well once nausea subsides. Began education in sternal precautions. Pt has a supportive husband who can assist her at home.  Will follow acutely.  Follow Up Recommendations  Home health OT;Supervision/Assistance - 24 hour    Equipment Recommendations  3 in 1 bedside comode    Recommendations for Other Services      Precautions / Restrictions Precautions Precautions: Sternal;Fall Precaution Comments: educated on sternal precautions, rationale       Mobility Bed Mobility               General bed mobility comments: in recliner  Transfers                 General transfer comment: unable to attempt due to extreme nausea, burping and feeling she will vomit    Balance Overall balance assessment:  (denies h/o falls; unable to assess due to nausea)                                 ADL Overall ADL's : Needs assistance/impaired Eating/Feeding: NPO   Grooming: Wash/dry hands;Wash/dry face;Set up;Sitting   Upper Body Bathing: Minimal assitance;Sitting   Lower Body Bathing: Maximal assistance;Sitting/lateral leans   Upper Body Dressing : Minimal assistance;Sitting   Lower Body Dressing: Maximal assistance;Sitting/lateral leans                 General ADL Comments: Pt limited by nausea.      Vision                     Perception     Praxis      Cognition    Behavior During Therapy: WFL for tasks assessed/performed Overall Cognitive Status: Within Functional Limits for tasks assessed                       Extremity/Trunk Assessment  Upper Extremity Assessment Upper Extremity Assessment: Overall WFL for tasks assessed;RUE deficits/detail (unable to formally test due to precautions) RUE Deficits / Details: moderate edema, elevated   Lower Extremity Assessment Lower Extremity Assessment: Overall WFL for tasks assessed (knee extension 4/5, ankle DF 5/5)        Exercises    Shoulder Instructions       General Comments      Pertinent Vitals/ Pain       Pain Assessment: No/denies pain  Home Living Family/patient expects to be discharged to:: Private residence Living Arrangements: Spouse/significant other Available Help at Discharge: Family;Available 24 hours/day Type of Home: House Home Access: Stairs to enter [employer] of Steps: 4 Entrance Stairs-Rails: Right Home Layout: One level     Bathroom Shower/Tub: Walk-in shower;Door   [employer] Toilet: Handicapped height Bathroom Accessibility: Yes How Accessible: Accessible via walker Home Equipment: Grab bars - tub/shower;Hand held shower head          Prior Functioning/Environment Level of  Independence: Independent        Comments: unsteady gait at baseline, drives   Frequency Min 2X/week     Progress Toward Goals  OT Goals(current goals can now be found in the care plan section)     Acute Rehab OT Goals Patient Stated Goal: be able to go home with spouse assist OT Goal Formulation: With patient Time For Goal Achievement: 05/12/15 Potential to Achieve Goals: Good ADL Goals Pt Will Perform Grooming: with supervision;standing Pt Will Perform Upper Body Bathing: sitting;with supervision Pt Will Perform Lower Body Bathing: with min assist;sit to/from stand Pt Will Perform Upper Body Dressing: with supervision;sitting Pt Will Perform Lower  Body Dressing: with min assist;sit to/from stand Pt Will Transfer to Toilet: with supervision;ambulating;bedside commode (over toilet) Pt Will Perform Toileting - Clothing Manipulation and hygiene: with supervision;sit to/from stand Pt Will Perform Tub/Shower Transfer: Shower transfer;with min guard assist;ambulating;3 in 1;rolling walker;grab bars Additional ADL Goal #1: Pt will generalize sternal precautions in mobility and ADL. Additional ADL Goal #2: Pt will employ energy conservation strategies in ADL and mobility with min verbal cues.  Plan      Co-evaluation      Reason for Co-Treatment: Complexity of the patient's impairments (multi-system involvement);For patient/therapist safety PT goals addressed during session: Other (comment) (limited to education due to nausea)        End of Session Equipment Utilized During Treatment: Oxygen (5L)   Activity Tolerance Treatment limited secondary to medical complications (Comment) (nausea)   Patient Left in chair;with call bell/phone within reach   Nurse Communication          Time: 0093-8182 OT Time Calculation (min): 24 min  Charges: OT General Charges $OT Visit: 1 Procedure OT Evaluation $Initial OT Evaluation Tier I: 1 Procedure  Malka So 04/28/2015, 12:47 PM [phone]

## 2015-04-28 NOTE — Progress Notes (Addendum)
[address]       [place] [address]             [phone]        CARDIOTHORACIC SURGERY PROGRESS NOTE   R3 Days Post-Op Procedure(s) (LRB): MITRAL VALVE  REPLACEMENT using a 27 mm Edwards Perimount Magna Mitral Ease Valve (N/A) TRICUSPID VALVE REPAIR (N/A) CORONARY ARTERY BYPASS GRAFTING (CABG) x ONE, using left internal mammary artery (N/A) MAZE (N/A) TRANSESOPHAGEAL ECHOCARDIOGRAM (TEE) (N/A) CLIPPING OF ATRIAL APPENDAGE (N/A)  Subjective: Feels better.  Denies pain and reports breathing better.  Weak.  Objective: Vital signs: BP Readings from Last 1 Encounters:  04/28/15 90/59   Pulse Readings from Last 1 Encounters:  04/28/15 81   Resp Readings from Last 1 Encounters:  04/28/15 20   Temp Readings from Last 1 Encounters:  04/28/15 97.9 F (36.6 C) Oral    Hemodynamics: CVP:  [7 mmHg] 7 mmHg  Mixed venous co-ox 65.3%  Physical Exam:  Rhythm:   Atrial lead confirms Aflutter w/ V-rate 50-60 - VVI pacing for increased HR - attempted RAP unsuccessful  Breath sounds: Diminished at bases  Heart sounds:  RRR  Incisions:  Dressing dry, intact  Abdomen:  Soft, non-distended, non-tender  Extremities:  Warm, well-perfused, swollen  Chest tubes:  Thin serosanguinous output - still draining too much for tube removal, no air leak     Intake/Output from previous day: 08/18 0701 - 08/19 0700 In: 1354.8 [P.O.:600; I.V.:754.8] Out: 2992 [Urine:2260; Chest Tube:732] Intake/Output this shift:    Lab Results:  CBC: Recent Labs  04/27/15 0435 04/28/15 0450  WBC 17.9* 13.9*  HGB 10.0* 9.7*  HCT 30.3* 29.9*  PLT 87* 91*    BMET:  Recent Labs  04/27/15 0435 04/28/15 0450  NA 143 139  K 4.1 3.1*  CL 110 101  CO2 25 29  GLUCOSE 149* 126*  BUN 18 19  CREATININE 1.03* 0.81  CALCIUM 8.5* 8.3*     PT/INR:   Recent Labs  04/25/15 1645  LABPROT 19.4*  INR 1.63*    CBG (last 3)   Recent Labs  04/27/15 1909 04/27/15 2351  04/28/15 0354  GLUCAP 130* 130* 108*    ABG    Component Value Date/Time   PHART 7.391 04/26/2015 0952   PCO2ART 42.7 04/26/2015 0952   PO2ART 55.0* 04/26/2015 0952   HCO3 25.9* 04/26/2015 0952   TCO2 23 04/26/2015 1659   ACIDBASEDEF 3.0* 04/26/2015 0832   O2SAT 65.3 04/28/2015 0450    CXR: PORTABLE CHEST - 1 VIEW  COMPARISON: 04/27/2015.  FINDINGS: Left subclavian sheath noted with tip in the region subclavian vein. The subclavian sheath is again noted to be kinked. Bilateral chest tubes in stable position. Interval removal of mediastinal drains catheters. Prior cardiac valve replacements. Left atrial appendage clip. Stable cardiomegaly. Persistent bibasilar subsegmental atelectasis. Small left pleural effusion cannot be excluded .  IMPRESSION: 1. Left subclavian sheath noted with tip projected over the left subclavian vein. The sheath is again noted to be kinked. Bilateral chest tubes in stable position. Interim removal of mediastinal drainage catheters. 2. Prior cardiac valve replacements. Stable cardiomegaly. 3. Persistent bibasilar atelectasis. Small left pleural effusion cannot be excluded. No pneumothorax.   Electronically Signed  By: Marcello Moores Register  On: 04/28/2015 07:37  Assessment/Plan: S/P Procedure(s) (LRB): MITRAL VALVE  REPLACEMENT using a 27 mm Edwards Perimount Magna Mitral Ease Valve (N/A) TRICUSPID VALVE REPAIR (N/A) CORONARY ARTERY BYPASS GRAFTING (CABG) x ONE, using left  internal mammary artery (N/A) MAZE (N/A) TRANSESOPHAGEAL ECHOCARDIOGRAM (TEE) (N/A) CLIPPING OF ATRIAL APPENDAGE (N/A)  Overall stable now POD3 Underlying rhythm improved although now appears to be in Aflutter, HR improved Stable hemodynamics off milrinone w/ mixed venous co-ox 65% Diuresing much better on low dose dopamine and lasix drips Expected post op atelectasis, O2 sats stable 96% on 5 L/min via [place] Expected post op acute blood loss anemia, stable Post op  thrombocytopenia, mild, improving Hypokalemia, diuretic induced Generalized deconditioning   Continue to hold beta blockers and continue pacing for now to maintain HR   Continue lasix drip and low dose dopamine today to facilitate diuresis  Leave pleural tubes in place until output decreased  Mobilize  PT consult  Low dose lovenox for DVT prophylaxis  Start coumadin slowly  Insert PICC and remove old central line  Supplement potassium  Keep in SICU for now  Rexene Alberts 04/28/2015 7:44 AM

## 2015-04-28 NOTE — Evaluation (Addendum)
Physical Therapy Evaluation Patient Details Name: [name] MRN: [id number] DOB: 1935-03-14 Today's Date: 04/28/2015   History of Present Illness  Adm 04/25/15 for MVR, tricuspid valve repair, CABG x 1, and clipping of atrial appendage. Post-op hypotension requiring pressors and N/V PMHx- COPD (home O2), CHF, Menieres, HTN, Rt breast Ca; bil cochlear implants    Clinical Impression  Patient is s/p above surgery resulting in functional limitations due to the deficits listed below (see PT Problem List). Patient very nauseous despite pre-medication for nausea. Limited assessment due to nausea. Anticipate she should progress well based on prior functional status (once nausea and BP stabilize). Patient will benefit from skilled PT to increase their independence and safety with mobility to allow discharge to the venue listed below.       Follow Up Recommendations Home health PT;Supervision for mobility/OOB    Equipment Recommendations  Rolling walker with 5" wheels    Recommendations for Other Services       Precautions / Restrictions Precautions Precautions: Sternal;Fall Precaution Comments: educated on sternal precautions, rationale      Mobility  Bed Mobility               General bed mobility comments: in recliner  Transfers                 General transfer comment: unable to attempt due to extreme nausea, burping and feeling she will vomit  Ambulation/Gait                Stairs            Wheelchair Mobility    Modified Rankin (Stroke Patients Only)       Balance Overall balance assessment:  (denies h/o falls; unable to assess due to nausea)                                           Pertinent Vitals/Pain Pain Assessment: No/denies pain    Home Living Family/patient expects to be discharged to:: Private residence Living Arrangements: Spouse/significant other Available Help at Discharge: Family;Available 24  hours/day Type of Home: House Home Access: Stairs to enter Entrance Stairs-Rails: Right Entrance Stairs-Number of Steps: 4 Home Layout: One level Home Equipment: Grab bars - tub/shower;Hand held shower head      Prior Function Level of Independence: Independent         Comments: unsteady gait at baseline, drives     Hand Dominance   Dominant Hand: Right    Extremity/Trunk Assessment   Upper Extremity Assessment: Defer to OT evaluation           Lower Extremity Assessment: Overall WFL for tasks assessed (knee extension 4/5, ankle DF 5/5)         Communication   Communication: HOH  Cognition Arousal/Alertness: Awake/alert Behavior During Therapy: WFL for tasks assessed/performed Overall Cognitive Status: Within Functional Limits for tasks assessed                      General Comments General comments (skin integrity, edema, etc.): Pt had been given all nausea meds that have been ordered; RN paging MD to see if other meds can be ordered    Exercises General Exercises - Lower Extremity Ankle Circles/Pumps: AROM;Both;10 reps Long Arc Quad: AROM;Both;5 reps Other Exercises Other Exercises: Educated on use of IS. Pt required max cues for use and only  able to reach 250 ml      Assessment/Plan    PT Assessment Patient needs continued PT services  PT Diagnosis Difficulty walking   PT Problem List Decreased activity tolerance;Decreased balance;Decreased mobility;Decreased knowledge of use of DME;Decreased knowledge of precautions;Cardiopulmonary status limiting activity (h/o Menieres)  PT Treatment Interventions DME instruction;Gait training;Stair training;Functional mobility training;Therapeutic activities;Therapeutic exercise;Balance training;Patient/family education   PT Goals (Current goals can be found in the Care Plan section) Acute Rehab PT Goals Patient Stated Goal: be able to go home with spouse assist PT Goal Formulation: With patient Time For  Goal Achievement: 05/05/15 Potential to Achieve Goals: Good    Frequency Min 3X/week   Barriers to discharge        Co-evaluation PT/OT/SLP Co-Evaluation/Treatment: Yes Reason for Co-Treatment: Complexity of the patient's impairments (multi-system involvement);For patient/therapist safety (on dopamine; multiple lines, chest tubes, external pacer) PT goals addressed during session: Other (comment) (limited to education due to nausea)         End of Session   Activity Tolerance: Treatment limited secondary to medical complications (Comment) (nausea) Patient left: in chair;with call bell/phone within reach Nurse Communication: Other (comment) (nausea, burping, feels she will vomit)         Time: 4166-0630 PT Time Calculation (min) (ACUTE ONLY): 23 min   Charges:   PT Evaluation $Initial PT Evaluation Tier I: 1 Procedure     PT G Codes:        Caitlyn Buchanan 24-May-2015, 12:33 PM Pager [phone]

## 2015-04-28 NOTE — Progress Notes (Signed)
CT surgery p.m. Rounds  Patient out of bed and chair, with some nausea today but improved with Reglan Diuresing well with Lasix Paced rhythm, stable blood pressure  continue current care

## 2015-04-28 NOTE — Progress Notes (Signed)
Pt with constant c/o nausea; relieved briefly with IV Zofran; MD paged regarding poss Reglan orders; will await callback.  [name]

## 2015-04-28 NOTE — Progress Notes (Signed)
New orders obtained for scheduled IV reglan; will administer when available.  [name]

## 2015-04-28 NOTE — Progress Notes (Signed)
Pt given IV Zofran at this time for c/o nausea; pt sitting up in chair; will cont. To monitor.

## 2015-04-29 ENCOUNTER — Inpatient Hospital Stay (HOSPITAL_COMMUNITY): Payer: Medicare PPO

## 2015-04-29 LAB — POCT I-STAT, CHEM 8
BUN: 19 mg/dL (ref 6–20)
CHLORIDE: 95 mmol/L — AB (ref 101–111)
Calcium, Ion: 1.14 mmol/L (ref 1.13–1.30)
Creatinine, Ser: 0.9 mg/dL (ref 0.44–1.00)
Glucose, Bld: 160 mg/dL — ABNORMAL HIGH (ref 65–99)
HEMATOCRIT: 29 % — AB (ref 36.0–46.0)
HEMOGLOBIN: 9.9 g/dL — AB (ref 12.0–15.0)
POTASSIUM: 3 mmol/L — AB (ref 3.5–5.1)
SODIUM: 140 mmol/L (ref 135–145)
TCO2: 34 mmol/L (ref 0–100)

## 2015-04-29 LAB — BASIC METABOLIC PANEL
Anion gap: 11 (ref 5–15)
BUN: 17 mg/dL (ref 6–20)
CHLORIDE: 99 mmol/L — AB (ref 101–111)
CO2: 33 mmol/L — AB (ref 22–32)
CREATININE: 0.8 mg/dL (ref 0.44–1.00)
Calcium: 8.2 mg/dL — ABNORMAL LOW (ref 8.9–10.3)
GFR calc Af Amer: >60 mL/min (ref >60)
GFR calc non Af Amer: >60 mL/min (ref >60)
Glucose, Bld: 133 mg/dL — ABNORMAL HIGH (ref 65–99)
Potassium: 3.1 mmol/L — ABNORMAL LOW (ref 3.5–5.1)
Sodium: 143 mmol/L (ref 135–145)

## 2015-04-29 LAB — TYPE AND SCREEN
ABO/RH(D): O POS
ANTIBODY SCREEN: NEGATIVE
UNIT DIVISION: 0
UNIT DIVISION: 0
UNIT DIVISION: 0
UNIT DIVISION: 0
Unit division: 0
Unit division: 0
Unit division: 0
Unit division: 0

## 2015-04-29 LAB — CARBOXYHEMOGLOBIN
Carboxyhemoglobin: 1.8 % — ABNORMAL HIGH (ref 0.5–1.5)
Methemoglobin: 1.1 % (ref 0.0–1.5)
O2 Saturation: 61.7 %
Total hemoglobin: 11.2 g/dL — ABNORMAL LOW (ref 12.0–16.0)

## 2015-04-29 LAB — CBC
HEMATOCRIT: 31 % — AB (ref 36.0–46.0)
HEMOGLOBIN: 10 g/dL — AB (ref 12.0–15.0)
MCH: 29.9 pg (ref 26.0–34.0)
MCHC: 32.3 g/dL (ref 30.0–36.0)
MCV: 92.5 fL (ref 78.0–100.0)
Platelets: 136 10*3/uL — ABNORMAL LOW (ref 150–400)
RBC: 3.35 MIL/uL — ABNORMAL LOW (ref 3.87–5.11)
RDW: 18 % — ABNORMAL HIGH (ref 11.5–15.5)
WBC: 12.7 10*3/uL — ABNORMAL HIGH (ref 4.0–10.5)

## 2015-04-29 LAB — GLUCOSE, CAPILLARY
GLUCOSE-CAPILLARY: 114 mg/dL — AB (ref 65–99)
Glucose-Capillary: 248 mg/dL — ABNORMAL HIGH (ref 65–99)
Glucose-Capillary: 132 mg/dL — ABNORMAL HIGH (ref 65–99)

## 2015-04-29 LAB — PROTIME-INR
INR: 1.24 (ref 0.00–1.49)
Prothrombin Time: 15.8 seconds — ABNORMAL HIGH (ref 11.6–15.2)

## 2015-04-29 MED ORDER — POTASSIUM CHLORIDE 10 MEQ/50ML IV SOLN
10.0000 meq | INTRAVENOUS | Status: AC
  Administered 2015-04-29 (×2): 10 meq via INTRAVENOUS
  Filled 2015-04-29 (×2): qty 50

## 2015-04-29 MED ORDER — SODIUM CHLORIDE 0.9 % IJ SOLN
10.0000 mL | INTRAMUSCULAR | Status: DC | PRN
  Administered 2015-04-29: 40 mL
  Administered 2015-04-30: 10 mL
  Administered 2015-04-30 – 2015-05-04 (×2): 20 mL
  Administered 2015-05-05: 10 mL
  Administered 2015-05-06: 20 mL
  Administered 2015-05-06: 10 mL
  Administered 2015-05-07: 30 mL
  Administered 2015-05-08 – 2015-05-09 (×2): 10 mL
  Administered 2015-05-10: 30 mL
  Administered 2015-05-11 (×2): 10 mL
  Filled 2015-04-29 (×13): qty 40

## 2015-04-29 MED ORDER — POTASSIUM CHLORIDE 10 MEQ/50ML IV SOLN
10.0000 meq | INTRAVENOUS | Status: AC
  Administered 2015-04-29 (×2): 10 meq via INTRAVENOUS
  Filled 2015-04-29: qty 50

## 2015-04-29 MED ORDER — POTASSIUM CHLORIDE 10 MEQ/50ML IV SOLN
INTRAVENOUS | Status: AC
  Administered 2015-04-29: 10 meq via INTRAVENOUS
  Filled 2015-04-29: qty 50

## 2015-04-29 MED ORDER — POTASSIUM CHLORIDE 10 MEQ/50ML IV SOLN
INTRAVENOUS | Status: AC
  Administered 2015-04-29: 10 meq
  Filled 2015-04-29: qty 50

## 2015-04-29 MED ORDER — SODIUM CHLORIDE 0.9 % IJ SOLN
10.0000 mL | Freq: Two times a day (BID) | INTRAMUSCULAR | Status: DC
  Administered 2015-05-01: 10 mL
  Administered 2015-05-01: 20 mL
  Administered 2015-05-02 – 2015-05-03 (×4): 10 mL
  Administered 2015-05-03: 30 mL
  Administered 2015-05-04 – 2015-05-10 (×7): 10 mL

## 2015-04-29 NOTE — Progress Notes (Signed)
Peripherally Inserted Central Catheter/Midline Placement  The IV Nurse has discussed with the patient and/or persons authorized to consent for the patient, the purpose of this procedure and the potential benefits and risks involved with this procedure.  The benefits include less needle sticks, lab draws from the catheter and patient may be discharged home with the catheter.  Risks include, but not limited to, infection, bleeding, blood clot (thrombus formation), and puncture of an artery; nerve damage and irregular heat beat.  Alternatives to this procedure were also discussed.  PICC/Midline Placement Documentation  PICC Triple Lumen 43/27/61 PICC Left Basilic 45 cm 0 cm (Active)  Indication for Insertion or Continuance of Line Vasoactive infusions 04/29/2015 11:54 AM  Exposed Catheter (cm) 0 cm 04/29/2015 11:54 AM  Site Assessment Clean;Dry;Intact 04/29/2015 11:54 AM  Lumen #1 Status Flushed;Saline locked;Blood return noted 04/29/2015 11:54 AM  Lumen #2 Status Flushed;Saline locked;Blood return noted 04/29/2015 11:54 AM  Lumen #3 Status Flushed;Saline locked;Blood return noted 04/29/2015 11:54 AM  Dressing Type Transparent 04/29/2015 11:54 AM  Dressing Change Due 05/06/15 04/29/2015 11:54 AM       Gordan Payment 04/29/2015, 11:57 AM

## 2015-04-29 NOTE — Progress Notes (Addendum)
4 Days Post-Op Procedure(s) (LRB): MITRAL VALVE  REPLACEMENT using a 27 mm Edwards Perimount Magna Mitral Ease Valve (N/A) TRICUSPID VALVE REPAIR (N/A) CORONARY ARTERY BYPASS GRAFTING (CABG) x ONE, using left internal mammary artery (N/A) MAZE (N/A) TRANSESOPHAGEAL ECHOCARDIOGRAM (TEE) (N/A) CLIPPING OF ATRIAL APPENDAGE (N/A) Subjective: Walked from bed to chair Chest tubes with sig output Lasix drip with excellent response A-v paced for jx rhythm  Objective: Vital signs in last 24 hours: Temp:  [97.1 F (36.2 C)-98.2 F (36.8 C)] 98.2 F (36.8 C) (08/20 0900) Pulse Rate:  [80-84] 80 (08/20 0600) Cardiac Rhythm:  [-] A-V Sequential paced;Other (Comment) (08/19 2000) Resp:  [9-24] 10 (08/20 0600) BP: (104-146)/(40-71) 132/58 mmHg (08/20 0600) SpO2:  [95 %-100 %] 99 % (08/20 0600) Weight:  [152 lb 1.9 oz (69 kg)] 152 lb 1.9 oz (69 kg) (08/20 0600)  Hemodynamic parameters for last 24 hours:  stable  Intake/Output from previous day: 08/19 0701 - 08/20 0700 In: 982.5 [I.V.:832.5; IV Piggyback:150] Out: 6063 [Urine:3120; Chest Tube:440] Intake/Output this shift: Total I/O In: 134.1 [I.V.:34.1; IV Piggyback:100] Out: 480 [Urine:400; Chest Tube:80]  Lungs clear No murmur  Lab Results:  Recent Labs  04/28/15 0450 04/29/15 0519  WBC 13.9* 12.7*  HGB 9.7* 10.0*  HCT 29.9* 31.0*  PLT 91* 136*   BMET:  Recent Labs  04/28/15 0450 04/29/15 0519  NA 139 143  K 3.1* 3.1*  CL 101 99*  CO2 29 33*  GLUCOSE 126* 133*  BUN 19 17  CREATININE 0.81 0.80  CALCIUM 8.3* 8.2*    PT/INR:  Recent Labs  04/29/15 0519  LABPROT 15.8*  INR 1.24   ABG    Component Value Date/Time   PHART 7.391 04/26/2015 0952   HCO3 25.9* 04/26/2015 0952   TCO2 23 04/26/2015 1659   ACIDBASEDEF 3.0* 04/26/2015 0832   O2SAT 61.7 04/29/2015 0710   CBG (last 3)   Recent Labs  04/28/15 0354 04/28/15 0746 04/29/15 0915  GLUCAP 108* 121* 248*    Assessment/Plan: S/P Procedure(s)  (LRB): MITRAL VALVE  REPLACEMENT using a 27 mm Edwards Perimount Magna Mitral Ease Valve (N/A) TRICUSPID VALVE REPAIR (N/A) CORONARY ARTERY BYPASS GRAFTING (CABG) x ONE, using left internal mammary artery (N/A) MAZE (N/A) TRANSESOPHAGEAL ECHOCARDIOGRAM (TEE) (N/A) CLIPPING OF ATRIAL APPENDAGE (N/A) Mobilize Diuresis d/c tubes/lines Continue foley due to urinary output monitoring Cont coumadin  LOS: 4 days    Tharon Aquas Trigt III 04/29/2015

## 2015-04-29 NOTE — Progress Notes (Signed)
Physical Therapy Treatment Patient Details Name: JAISA DEFINO MRN: [id number] DOB: 07-Sep-1935 Today's Date: 04/29/2015    History of Present Illness Adm 04/25/15 for MVR, tricuspid valve repair, CABG x 1, and clipping of atrial appendage. Post-op hypotension requiring pressors and N/V PMHx- COPD (home O2), CHF, Menieres, HTN, Rt breast Ca; bil cochlear implants    PT Comments    Pt tolerated increased mobility, with nausea not a factor, though continues to fatigue quickly. Worked on pregait and short distance gait as well as standing exercises. PT will continue to follow.   Follow Up Recommendations  Home health PT;Supervision for mobility/OOB     Equipment Recommendations  Rolling walker with 5" wheels    Recommendations for Other Services       Precautions / Restrictions Precautions Precautions: Sternal;Fall Precaution Comments: reviewed sternal precautions in relation to sit to stand Restrictions Weight Bearing Restrictions: Yes    Mobility  Bed Mobility Overal bed mobility: Needs Assistance Bed Mobility: Sit to Supine       Sit to supine: Mod assist   General bed mobility comments: mod A for legs into bed and positioning in supine  Transfers Overall transfer level: Needs assistance Equipment used: Rolling walker (2 wheeled) Transfers: Sit to/from [facility] Sit to Stand: Mod assist Stand pivot transfers: Mod assist       General transfer comment: vc's for pt not to push on RW due to sternal precautions, with hands on knees, pt requires mod A for power up as well as mod A  to steady for SPT with RW as pt very fatigued at that point and knees beginning to buckle  Ambulation/Gait Ambulation/Gait assistance: Mod assist Ambulation Distance (Feet): 4 Feet Assistive device: Rolling walker (2 wheeled) Gait Pattern/deviations: Step-to pattern;Decreased stride length;Trunk flexed Gait velocity: very slow Gait velocity interpretation: <1.8 ft/sec,  indicative of risk for recurrent falls General Gait Details: 4' fwd and bkwd, pt has difficulty moving RW on her own as very fatigued. O2 sats to 89% on 5L O2 with ambulation. Worked on standing and marching in place next 2 bouts of exercise for using slightly less energy. Pt tolerated this better   Stairs            Wheelchair Mobility    Modified Rankin (Stroke Patients Only)       Balance Overall balance assessment: Needs assistance Sitting-balance support: Bilateral upper extremity supported Sitting balance-Leahy Scale: Poor     Standing balance support: Bilateral upper extremity supported Standing balance-Leahy Scale: Poor                      Cognition Arousal/Alertness: Awake/alert Behavior During Therapy: WFL for tasks assessed/performed Overall Cognitive Status: Within Functional Limits for tasks assessed                      Exercises General Exercises - Lower Extremity Hip Flexion/Marching: AROM;Both;Standing;20 reps (10x2) Mini-Sqauts: AROM;10 reps;Standing    General Comments General comments (skin integrity, edema, etc.): pt not nauseous today but very fatigued. With marching in place, O2 sats remained 95% on 5L O2      Pertinent Vitals/Pain Pain Assessment: No/denies pain    Home Living                      Prior Function            PT Goals (current goals can now be found in the care plan section) Acute  Rehab PT Goals Patient Stated Goal: be able to go home with spouse assist PT Goal Formulation: With patient Time For Goal Achievement: 05/05/15 Potential to Achieve Goals: Good Progress towards PT goals: Progressing toward goals    Frequency  Min 3X/week    PT Plan Current plan remains appropriate    Co-evaluation             End of Session Equipment Utilized During Treatment: Gait belt;Oxygen Activity Tolerance: Patient tolerated treatment well Patient left: in bed;with call bell/phone within  reach;with family/visitor present     Time: 7001-7494 PT Time Calculation (min) (ACUTE ONLY): 36 min  Charges:  $Therapeutic Activity: 23-37 mins                    G Codes:     Leighton Roach, PT  Acute Rehab Services  [place], [place] 04/29/2015, 1:15 PM

## 2015-04-30 ENCOUNTER — Inpatient Hospital Stay (HOSPITAL_COMMUNITY): Payer: Medicare PPO

## 2015-04-30 LAB — BASIC METABOLIC PANEL
Anion gap: 10 (ref 5–15)
BUN: 11 mg/dL (ref 6–20)
CALCIUM: 8.3 mg/dL — AB (ref 8.9–10.3)
CO2: 38 mmol/L — ABNORMAL HIGH (ref 22–32)
CREATININE: 0.59 mg/dL (ref 0.44–1.00)
Chloride: 93 mmol/L — ABNORMAL LOW (ref 101–111)
GFR calc non Af Amer: >60 mL/min (ref >60)
Glucose, Bld: 152 mg/dL — ABNORMAL HIGH (ref 65–99)
Potassium: 3.5 mmol/L (ref 3.5–5.1)
SODIUM: 141 mmol/L (ref 135–145)

## 2015-04-30 LAB — PROTIME-INR
INR: 1.24 (ref 0.00–1.49)
PROTHROMBIN TIME: 15.7 s — AB (ref 11.6–15.2)

## 2015-04-30 LAB — POCT I-STAT, CHEM 8
BUN: 17 mg/dL (ref 6–20)
CALCIUM ION: 1.13 mmol/L (ref 1.13–1.30)
CREATININE: 0.8 mg/dL (ref 0.44–1.00)
Chloride: 91 mmol/L — ABNORMAL LOW (ref 101–111)
GLUCOSE: 147 mg/dL — AB (ref 65–99)
HEMATOCRIT: 34 % — AB (ref 36.0–46.0)
HEMOGLOBIN: 11.6 g/dL — AB (ref 12.0–15.0)
Potassium: 3.7 mmol/L (ref 3.5–5.1)
Sodium: 137 mmol/L (ref 135–145)
TCO2: 34 mmol/L (ref 0–100)

## 2015-04-30 MED ORDER — POTASSIUM CHLORIDE 10 MEQ/50ML IV SOLN
10.0000 meq | INTRAVENOUS | Status: AC
  Administered 2015-04-30 (×3): 10 meq via INTRAVENOUS
  Filled 2015-04-30 (×2): qty 50

## 2015-04-30 MED ORDER — FUROSEMIDE 40 MG PO TABS
40.0000 mg | ORAL_TABLET | Freq: Every day | ORAL | Status: DC
  Administered 2015-04-30 – 2015-05-04 (×5): 40 mg via ORAL
  Filled 2015-04-30 (×7): qty 1

## 2015-04-30 NOTE — Progress Notes (Signed)
5 Days Post-Op Procedure(s) (LRB): MITRAL VALVE  REPLACEMENT using a 27 mm Edwards Perimount Magna Mitral Ease Valve (N/A) TRICUSPID VALVE REPAIR (N/A) CORONARY ARTERY BYPASS GRAFTING (CABG) x ONE, using left internal mammary artery (N/A) MAZE (N/A) TRANSESOPHAGEAL ECHOCARDIOGRAM (TEE) (N/A) CLIPPING OF ATRIAL APPENDAGE (N/A) Subjective: Continued slow progress- too weak to walk in hall CXR improved- DC chest tubes Edema resolved - stop lasix drip  Objective: Vital signs in last 24 hours: Temp:  [97.5 F (36.4 C)-98.6 F (37 C)] 97.5 F (36.4 C) (08/21 0721) Pulse Rate:  [80-89] 84 (08/21 0800) Cardiac Rhythm:  [-] A-V Sequential paced (08/21 0807) Resp:  [11-30] 29 (08/21 0800) BP: (87-162)/(25-101) 139/73 mmHg (08/21 0800) SpO2:  [92 %-100 %] 97 % (08/21 0800) Weight:  [146 lb 13.2 oz (66.6 kg)] 146 lb 13.2 oz (66.6 kg) (08/21 0600)  Hemodynamic parameters for last 24 hours:  junctional rhythm  Intake/Output from previous day: 08/20 0701 - 08/21 0700 In: 1941.2 [P.O.:730; I.V.:811.2; IV Piggyback:400] Out: 1610 [Urine:4225; Chest Tube:320] Intake/Output this shift: Total I/O In: 82.1 [I.V.:32.1; IV Piggyback:50] Out: 960 [Urine:450; Chest Tube:90]  No edema Neuro intact  Lab Results:  Recent Labs  04/28/15 0450 04/29/15 0519 04/29/15 1537  WBC 13.9* 12.7*  --   HGB 9.7* 10.0* 9.9*  HCT 29.9* 31.0* 29.0*  PLT 91* 136*  --    BMET:  Recent Labs  04/29/15 0519 04/29/15 1537 04/30/15 0427  NA 143 140 141  K 3.1* 3.0* 3.5  CL 99* 95* 93*  CO2 33*  --  38*  GLUCOSE 133* 160* 152*  BUN 17 19 11   CREATININE 0.80 0.90 0.59  CALCIUM 8.2*  --  8.3*    PT/INR:  Recent Labs  04/30/15 0427  LABPROT 15.7*  INR 1.24   ABG    Component Value Date/Time   PHART 7.391 04/26/2015 0952   HCO3 25.9* 04/26/2015 0952   TCO2 34 04/29/2015 1537   ACIDBASEDEF 3.0* 04/26/2015 0832   O2SAT 61.7 04/29/2015 0710   CBG (last 3)   Recent Labs  04/29/15 0915  04/29/15 1254 04/29/15 1648  GLUCAP 248* 114* 132*    Assessment/Plan: S/P Procedure(s) (LRB): MITRAL VALVE  REPLACEMENT using a 27 mm Edwards Perimount Magna Mitral Ease Valve (N/A) TRICUSPID VALVE REPAIR (N/A) CORONARY ARTERY BYPASS GRAFTING (CABG) x ONE, using left internal mammary artery (N/A) MAZE (N/A) TRANSESOPHAGEAL ECHOCARDIOGRAM (TEE) (N/A) CLIPPING OF ATRIAL APPENDAGE (N/A) d/c tubes/lines cont coumadin 2 mg daily   LOS: 5 days    [name] 04/30/2015

## 2015-04-30 NOTE — Progress Notes (Signed)
   04/30/15 1702  Vitals  Pulse Rate 87  ECG Heart Rate 86  Cardiac Rhythm Junctional rhythm (tach)  Resp (!) 34  Oxygen Therapy  SpO2 97 %  ECG Intervals  PR interval 0  QRS interval 0.12  QT interval 0.41  QTc interval 0.54  Dr. Darcey Gussie aware. asymptomatic

## 2015-04-30 NOTE — Progress Notes (Signed)
CT surgery p.m. Rounds  Patient had a good day-chest tubes removed Still too weak to walk in hall but ambulating in room to bedside commode AV sequentially paced Lasix drip stopped, edema much improved Patient had bowel movement today

## 2015-05-01 ENCOUNTER — Inpatient Hospital Stay (HOSPITAL_COMMUNITY): Payer: Medicare PPO

## 2015-05-01 DIAGNOSIS — Z953 Presence of xenogenic heart valve: Secondary | ICD-10-CM

## 2015-05-01 DIAGNOSIS — R001 Bradycardia, unspecified: Secondary | ICD-10-CM

## 2015-05-01 LAB — CBC
HCT: 31.9 % — ABNORMAL LOW (ref 36.0–46.0)
Hemoglobin: 10.1 g/dL — ABNORMAL LOW (ref 12.0–15.0)
MCH: 29.7 pg (ref 26.0–34.0)
MCHC: 31.7 g/dL (ref 30.0–36.0)
MCV: 93.8 fL (ref 78.0–100.0)
Platelets: 203 10*3/uL (ref 150–400)
RBC: 3.4 MIL/uL — ABNORMAL LOW (ref 3.87–5.11)
RDW: 16.9 % — ABNORMAL HIGH (ref 11.5–15.5)
WBC: 11.9 10*3/uL — ABNORMAL HIGH (ref 4.0–10.5)

## 2015-05-01 LAB — BASIC METABOLIC PANEL
Anion gap: 8 (ref 5–15)
BUN: 13 mg/dL (ref 6–20)
CO2: 37 mmol/L — ABNORMAL HIGH (ref 22–32)
Calcium: 8.4 mg/dL — ABNORMAL LOW (ref 8.9–10.3)
Chloride: 93 mmol/L — ABNORMAL LOW (ref 101–111)
Creatinine, Ser: 0.56 mg/dL (ref 0.44–1.00)
GFR calc Af Amer: >60 mL/min (ref >60)
GFR calc non Af Amer: >60 mL/min (ref >60)
Glucose, Bld: 130 mg/dL — ABNORMAL HIGH (ref 65–99)
Potassium: 3.9 mmol/L (ref 3.5–5.1)
Sodium: 138 mmol/L (ref 135–145)

## 2015-05-01 LAB — PROTIME-INR
INR: 1.42 (ref 0.00–1.49)
PROTHROMBIN TIME: 17.5 s — AB (ref 11.6–15.2)

## 2015-05-01 LAB — TSH: TSH: 6.238 u[IU]/mL — ABNORMAL HIGH (ref 0.350–4.500)

## 2015-05-01 MED ORDER — SODIUM CHLORIDE 0.9 % IV SOLN: INTRAVENOUS | Status: DC

## 2015-05-01 MED ORDER — POTASSIUM CHLORIDE 10 MEQ/50ML IV SOLN
10.0000 meq | INTRAVENOUS | Status: AC
  Administered 2015-05-01 (×3): 10 meq via INTRAVENOUS
  Filled 2015-05-01 (×3): qty 50

## 2015-05-01 MED ORDER — GENTAMICIN SULFATE 40 MG/ML IJ SOLN
80.0000 mg | INTRAMUSCULAR | Status: AC
  Administered 2015-05-02: 80 mg
  Filled 2015-05-01: qty 2

## 2015-05-01 MED ORDER — VANCOMYCIN HCL IN DEXTROSE 1-5 GM/200ML-% IV SOLN
1000.0000 mg | INTRAVENOUS | Status: AC
  Administered 2015-05-02: 1000 mg via INTRAVENOUS
  Filled 2015-05-01 (×2): qty 200

## 2015-05-01 NOTE — Progress Notes (Signed)
Patient ID: [name], female   DOB: 1935/06/11, 80 y.o.   MRN: [id number]  SICU Evening Rounds:  Hemodynamically stable  For permanent pacer tomorrow.  Urine output ok  She walked 42 ft today.

## 2015-05-01 NOTE — Consult Note (Signed)
Cardiologist:  Fletcher Anon  Reason for Consult: Bradycardia   [name] is an 80 y.o. female.  HPI:   The patient is a 80 year old female with history of hypertension, COPD, hyperlipidemia, atrial fibrillation, mitral regurgitation, COPD, hypothyroidism, breast cancer, pulmonary hypertension, tricuspid regurgitation,, chronic combined systolic and diastolic heart failure.  On 04/25/2015 the patient underwent mitral valve replacement with a bioprosthetic tissue valve, tricuspid valve repair with an Edwards MC 3 ring angioplasty, maze procedure, coronary artery bypass grafting 1 with a LIMA to the LAD.   Patient currently has a temporary pacemaker after the surgery.  They're unable to wean her off of it.  Patient reports that she's had problems with slow heart rate since January, however, I don't see documentation of this.  We are asked to see the patient for consideration for pacemaker implant.  She reports that she feels a little bit stronger today and was able to walk 47 feet with some assistance from a walker.  She's been having problems with some nausea which she attributes to medications.     Past Medical History  Diagnosis Date  . Hypertension   . COPD (chronic obstructive pulmonary disease)   . Hyperlipidemia   . Meniere disease   . HX: breast cancer   . Acute CHF (congestive heart failure)   . Atrial fibrillation   . Mitral regurgitation   . COPD (chronic obstructive pulmonary disease)   . Heart murmur     as child  . Hypothyroidism   . Cancer     hx breast cancer  . Breast cancer 08/09/2001    right/rad  . Pulmonary hypertension   . Tricuspid regurgitation   . Chronic combined systolic and diastolic CHF (congestive heart failure)   . Coronary artery disease   . PONV (postoperative nausea and vomiting)   . Shortness of breath dyspnea     on exertion  . Anxiety   . GERD (gastroesophageal reflux disease)   . Arthritis   . s/p mitral valve replacement with bioprosthetic  tissue valve 04/25/2015    27 mm [facility] Mitral bovine bioprosthetic tissue valve  . 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 S/P Maze operation for atrial fibrillation 04/25/2015    Complete bilateral atrial lesion set using cryothermy and bipolar radiofrequency ablation with clipping of LA appendage  . S/P CABG x 1 04/25/2015    LIMA to LAD    Past Surgical History  Procedure Laterality Date  . Cochlear implant Bilateral   . Mastectomy, partial Right   . Cardiac catheterization  11/2013    [facility]  . Cardiac catheterization  10/2014    [facility]  . Breast biopsy Left 11/25/06    neg  . Breast biopsy Left 01/20/12    lt bx /clip-neg  . Eye surgery Bilateral 2014    cataract surgery  . Mitral valve repair N/A 04/25/2015    Procedure: MITRAL VALVE  REPLACEMENT using a 27 mm Edwards Perimount Magna Mitral Ease Valve;  Surgeon: Jamie Alberts, MD;  Location: [place];  Service: Open Heart Surgery;  Laterality: N/A;  . Tricuspid valve replacement N/A 04/25/2015    Procedure: TRICUSPID VALVE REPAIR;  Surgeon: Jamie Alberts, MD;  Location: [place];  Service: Open Heart Surgery;  Laterality: N/A;  . Coronary artery bypass graft N/A 04/25/2015    Procedure: CORONARY ARTERY BYPASS GRAFTING (CABG) x ONE, using left internal mammary artery;  Surgeon: Jamie Gu  Roxy Manns, MD;  Location: [place];  Service: Open Heart Surgery;  Laterality: N/A;  . Maze N/A 04/25/2015    Procedure: MAZE;  Surgeon: Jamie Alberts, MD;  Location: [place];  Service: Open Heart Surgery;  Laterality: N/A;  . Tee without cardioversion N/A 04/25/2015    Procedure: TRANSESOPHAGEAL ECHOCARDIOGRAM (TEE);  Surgeon: Jamie Alberts, MD;  Location: [place];  Service: Open Heart Surgery;  Laterality: N/A;  . Clipping of atrial appendage N/A 04/25/2015    Procedure: CLIPPING OF ATRIAL APPENDAGE;  Surgeon: Jamie Alberts, MD;  Location: [place];  Service: Open Heart Surgery;  Laterality: N/A;    Family History   Problem Relation Age of Onset  . Heart disease Father   . Heart disease Brother   . Heart attack Paternal Uncle     Social History:  reports that she quit smoking about 17 years ago. Her smoking use included Cigarettes. She quit after 40 years of use. She does not have any smokeless tobacco history on file. She reports that she drinks alcohol. She reports that she does not use illicit drugs.  Allergies:  Allergies  Allergen Reactions  . Augmentin [Amoxicillin-Pot Clavulanate] Swelling    Swollen joints, pain  . Nifedipine     Other reaction(s): Unknown  . Penicillins     Other reaction(s): Other (See Comments) Swollen joints  . Propranolol     Other reaction(s): Unknown  . Ace Inhibitors     Other reaction(s): Cough  . Diazepam     Other reaction(s): Other (See Comments) Made her "hyper"  . Meperidine     Other reaction(s): Other (See Comments) Made her "hyper"  . Propoxyphene     Other reaction(s): Other (See Comments) Made her "hyper"    Medications: Scheduled Meds: . aspirin EC  325 mg Oral Daily  . bisacodyl  10 mg Oral Daily   Or  . bisacodyl  10 mg Rectal Daily  . docusate sodium  200 mg Oral Daily  . enoxaparin (LOVENOX) injection  30 mg Subcutaneous Daily  . furosemide  40 mg Oral Daily  . levothyroxine  88 mcg Oral QAC breakfast  . metoCLOPramide (REGLAN) injection  10 mg Intravenous 4 times per day  . pantoprazole  40 mg Oral Daily  . sodium chloride  10-40 mL Intracatheter Q12H  . spironolactone  25 mg Oral Daily  . warfarin  2 mg Oral q1800  . Warfarin - Physician Dosing Inpatient   Does not apply q1800   Continuous Infusions: . sodium chloride 20 mL/hr at 05/01/15 0600   PRN Meds:.ipratropium-albuterol, metoprolol, ondansetron (ZOFRAN) IV, oxyCODONE, sodium chloride, traMADol   Results for orders placed or performed during the hospital encounter of 04/25/15 (from the past 48 hour(s))  I-STAT, chem 8     Status: Abnormal   Collection Time:  04/29/15  3:37 PM  Result Value Ref Range   Sodium 140 135 - 145 mmol/L   Potassium 3.0 (L) 3.5 - 5.1 mmol/L   Chloride 95 (L) 101 - 111 mmol/L   BUN 19 6 - 20 mg/dL   Creatinine, Ser 0.90 0.44 - 1.00 mg/dL   Glucose, Bld 160 (H) 65 - 99 mg/dL   Calcium, Ion 1.14 1.13 - 1.30 mmol/L   TCO2 34 0 - 100 mmol/L   Hemoglobin 9.9 (L) 12.0 - 15.0 g/dL   HCT 29.0 (L) 36.0 - 46.0 %  Glucose, capillary     Status: Abnormal   Collection Time: 04/29/15  4:48 PM  Result Value Ref Range   Glucose-Capillary 132 (H) 65 - 99 mg/dL  Protime-INR     Status: Abnormal   Collection Time: 04/30/15  4:27 AM  Result Value Ref Range   Prothrombin Time 15.7 (H) 11.6 - 15.2 seconds   INR 1.24 0.00 - 5.03  Basic metabolic panel     Status: Abnormal   Collection Time: 04/30/15  4:27 AM  Result Value Ref Range   Sodium 141 135 - 145 mmol/L   Potassium 3.5 3.5 - 5.1 mmol/L   Chloride 93 (L) 101 - 111 mmol/L   CO2 38 (H) 22 - 32 mmol/L   Glucose, Bld 152 (H) 65 - 99 mg/dL   BUN 11 6 - 20 mg/dL   Creatinine, Ser 0.59 0.44 - 1.00 mg/dL   Calcium 8.3 (L) 8.9 - 10.3 mg/dL   GFR calc non Af Amer >60 >60 mL/min   GFR calc Af Amer >60 >60 mL/min    Comment: (NOTE) The eGFR has been calculated using the CKD EPI equation. This calculation has not been validated in all clinical situations. eGFR's persistently <60 mL/min signify possible Chronic Kidney Disease.    Anion gap 10 5 - 15  I-STAT, chem 8     Status: Abnormal   Collection Time: 04/30/15  5:35 PM  Result Value Ref Range   Sodium 137 135 - 145 mmol/L   Potassium 3.7 3.5 - 5.1 mmol/L   Chloride 91 (L) 101 - 111 mmol/L   BUN 17 6 - 20 mg/dL   Creatinine, Ser 0.80 0.44 - 1.00 mg/dL   Glucose, Bld 147 (H) 65 - 99 mg/dL   Calcium, Ion 1.13 1.13 - 1.30 mmol/L   TCO2 34 0 - 100 mmol/L   Hemoglobin 11.6 (L) 12.0 - 15.0 g/dL   HCT 34.0 (L) 36.0 - 46.0 %  Protime-INR     Status: Abnormal   Collection Time: 05/01/15  4:11 AM  Result Value Ref Range    Prothrombin Time 17.5 (H) 11.6 - 15.2 seconds   INR 1.42 0.00 - 1.49  CBC     Status: Abnormal   Collection Time: 05/01/15  4:11 AM  Result Value Ref Range   WBC 11.9 (H) 4.0 - 10.5 K/uL   RBC 3.40 (L) 3.87 - 5.11 MIL/uL   Hemoglobin 10.1 (L) 12.0 - 15.0 g/dL   HCT 31.9 (L) 36.0 - 46.0 %   MCV 93.8 78.0 - 100.0 fL   MCH 29.7 26.0 - 34.0 pg   MCHC 31.7 30.0 - 36.0 g/dL   RDW 16.9 (H) 11.5 - 15.5 %   Platelets 203 150 - 400 K/uL  Basic metabolic panel     Status: Abnormal   Collection Time: 05/01/15  4:11 AM  Result Value Ref Range   Sodium 138 135 - 145 mmol/L   Potassium 3.9 3.5 - 5.1 mmol/L   Chloride 93 (L) 101 - 111 mmol/L   CO2 37 (H) 22 - 32 mmol/L   Glucose, Bld 130 (H) 65 - 99 mg/dL   BUN 13 6 - 20 mg/dL   Creatinine, Ser 0.56 0.44 - 1.00 mg/dL   Calcium 8.4 (L) 8.9 - 10.3 mg/dL   GFR calc non Af Amer >60 >60 mL/min   GFR calc Af Amer >60 >60 mL/min    Comment: (NOTE) The eGFR has been calculated using the CKD EPI equation. This calculation has not been validated in all clinical situations. eGFR's persistently <60 mL/min signify possible Chronic Kidney Disease.  Anion gap 8 5 - 15    Dg Chest Port 1 View  05/01/2015   CLINICAL DATA:  CABG.  Prior cardiac valve replacements.  EXAM: PORTABLE CHEST - 1 VIEW  COMPARISON:  04/30/2015 .  FINDINGS: Several removal of left chest tube. Right PICC line in stable position . Prior CABG. Prior mitral and tricuspid valve replacements. Mild bilateral from interstitial prominence and pleural effusions noted. Very mild component congestive heart failure cannot be excluded. Basilar atelectasis. No pneumothorax.  IMPRESSION: 1. Interval removal of left chest tube. Right PICC line stable position. 2. Prior CABG. Prior mitral and tricuspid valve replacements. Mild interstitial prominence and bilateral effusions noted. A mild component of congestive heart failure. 3. Low lung volumes with basilar atelectasis.   Electronically Signed   By:  [name]   On: 05/01/2015 07:35   Dg Chest Port 1 View  04/30/2015   CLINICAL DATA:  Short of breath.  EXAM: PORTABLE CHEST - 1 VIEW  COMPARISON:  04/29/2015.  FINDINGS: Support apparatus: Grossly unchanged. The PICC may have been withdrawn 1 cm and the tip is at the expected location of the superior vena cava and RIGHT atrium.  Cardiomediastinal Silhouette: Unchanged, enlarged with tricuspid valve replacement and atrial clip.  Lungs: Unchanged.  No pneumothorax.  Effusions: Blunting of the RIGHT costophrenic angle which may be due to scarring and volume loss or small effusion.  Other:  None.  IMPRESSION: 1. Apparent retraction of LEFT upper extremity PICC with the tip at the junction of the superior vena cava and RIGHT atrium. 2. Other support apparatus stable. 3. No change in the appearance of the heart and lungs.   Electronically Signed   By: [name] M.D.   On: 04/30/2015 07:34    Review of Systems  All other systems reviewed and are negative.   The patient currently denies vomiting, fever, shortness of breath, orthopnea, dizziness, PND, cough, congestion, abdominal pain, hematochezia, melena, lower extremity edema, claudication.   Blood pressure 142/62, pulse 70, temperature 98.1 F (36.7 C), temperature source Oral, resp. rate 21, height _0  (1.575 m), weight 137 lb (62.143 kg), SpO2 100 %. Physical Exam  Nursing note and vitals reviewed. Constitutional: She is oriented to person, place, and time. She appears well-developed and well-nourished. No distress.  Patient appears comfortable lying in bed.  HENT:  Head: Normocephalic and atraumatic.  Eyes: EOM are normal. Pupils are equal, round, and reactive to light. No scleral icterus.  Neck: Normal range of motion. No JVD present.  Cardiovascular: Normal rate, regular rhythm, S1 normal and S2 normal.   No murmur heard. Pulses:      Radial pulses are 2+ on the right side, and 2+ on the left side.       Dorsalis pedis  pulses are 2+ on the right side, and 2+ on the left side.  Respiratory: Effort normal and breath sounds normal. She has no wheezes. She has no rales.  GI: Soft. Bowel sounds are normal. She exhibits no distension. There is no tenderness.  Musculoskeletal: She exhibits no edema.  Lymphadenopathy:    She has no cervical adenopathy.  Neurological: She is alert and oriented to person, place, and time. She exhibits normal muscle tone.  Skin: Skin is warm.  Psychiatric: She has a normal mood and affect.   8/12 personal review of ECG: Sinus rhythm, 1 degree AV block, LAD  Assessment/Plan: Principal Problem:   S/P mitral valve replacement with bioprosthetic valve, tricuspid valve repair, maze procedure  and CABG x1 Active Problems:   Atrial fibrillation, unspecified   Chronic systolic heart failure   Essential hypertension   Mitral regurgitation   Severe mitral regurgitation   Chronic systolic congestive heart failure   Pulmonary hypertension   Tricuspid regurgitation   Chronic combined systolic and diastolic CHF (congestive heart failure)   Coronary artery disease   S/P tricuspid valve repair   S/P Maze operation for atrial fibrillation   S/P CABG x 1  Junction rhythm as low as 37 BPM when the pacer was turned down. Pacemaker implantation was discussed along with potential complications.  Dr [name] see her to further discuss.  [name] check TSH.  [name], [place] 05/01/2015, 2:01 PM   I have seen and examined this patient with [name]  Agree with above, note added to reflect my findings.  On exam, her heart is regular and lungs are clear.  She remains V paced with a slow juntional rhytnhm underlying.  Her surgery was 6 days ago.  She has been bradycardic in the past.  At this time, she would benefit from dual chamber pacemaker.  Discussed pacemaker with her and her husband and they are agreeable.  We [name] aim for tomorrow, for the procedure.  After the procedure, ok to restart her  on coumadin for her AF.  Aicia Babinski M. Willona Phariss MD 05/01/2015 5:59 PM

## 2015-05-01 NOTE — Progress Notes (Signed)
Occupational Therapy Treatment Patient Details Name: [name] MRN: [id number] DOB: May 31, 1935 Today's Date: 05/01/2015    History of present illness Adm 04/25/15 for MVR, tricuspid valve repair, CABG x 1, and clipping of atrial appendage. Post-op hypotension requiring pressors and N/V PMHx- COPD (home O2), CHF, Menieres, HTN, Rt breast Ca; bil cochlear implants   OT comments  Pt progressing toward goals.  Requires min A - mod A for ADLs.  VSS  Follow Up Recommendations  Home health OT;Supervision/Assistance - 24 hour    Equipment Recommendations  3 in 1 bedside comode    Recommendations for Other Services      Precautions / Restrictions Precautions Precautions: Sternal;Fall Precaution Comments: Pt is able to independently state sternal precautions.  requires one verbal cue to adhere to precautions  Restrictions Weight Bearing Restrictions: Yes Other Position/Activity Restrictions: sternal precautions        Mobility Bed Mobility Overal bed mobility: Needs Assistance Bed Mobility: Supine to Sit;Sit to Supine     Supine to sit: Min assist Sit to supine: Min guard   General bed mobility comments: assist to lift trunk from bed and to lift LEs back onto bed   Transfers Overall transfer level: Needs assistance Equipment used: Rolling walker (2 wheeled) Transfers: Sit to/from [facility] Sit to Stand: Min assist Stand pivot transfers: Min assist;+2 safety/equipment       General transfer comment: min A to steady and +2 for lines     Balance Overall balance assessment: Needs assistance Sitting-balance support: Feet supported Sitting balance-Leahy Scale: Fair     Standing balance support: Bilateral upper extremity supported Standing balance-Leahy Scale: Poor                     ADL Overall ADL's : Needs assistance/impaired                         Toilet Transfer: Minimal assistance;+2 for safety/equipment;RW;Comfort height  toilet;Ambulation   Toileting- Clothing Manipulation and Hygiene: Moderate assistance;Sit to/from stand                Vision                     Perception     Praxis      Cognition   Behavior During Therapy: [facility] for tasks assessed/performed Overall Cognitive Status: Within Functional Limits for tasks assessed                       Extremity/Trunk Assessment               Exercises Other Exercises Other Exercises: pt may be going for pacer soon   Shoulder Instructions       General Comments      Pertinent Vitals/ Pain       Pain Assessment: No/denies pain  Home Living                                          Prior Functioning/Environment              Frequency Min 2X/week     Progress Toward Goals  OT Goals(current goals can now be found in the care plan section)  Progress towards OT goals: Progressing toward goals  Acute Rehab OT Goals Patient Stated Goal: be able to  go home with spouse assist OT Goal Formulation: With patient Time For Goal Achievement: 05/12/15 Potential to Achieve Goals: Good ADL Goals Pt Will Perform Grooming: with supervision;standing Pt Will Perform Upper Body Bathing: sitting;with supervision Pt Will Perform Lower Body Bathing: with min assist;sit to/from stand Pt Will Perform Upper Body Dressing: with supervision;sitting Pt Will Perform Lower Body Dressing: with min assist;sit to/from stand Pt Will Transfer to Toilet: with supervision;ambulating;bedside commode Pt Will Perform Toileting - Clothing Manipulation and hygiene: with supervision;sit to/from stand Pt Will Perform Tub/Shower Transfer: Shower transfer;with min guard assist;ambulating;3 in 1;rolling walker;grab bars Additional ADL Goal #1: Pt will generalize sternal precautions in mobility and ADL. Additional ADL Goal #2: Pt will employ energy conservation strategies in ADL and mobility with min verbal cues.  Plan Discharge  plan remains appropriate    Co-evaluation                 End of Session Equipment Utilized During Treatment: Oxygen   Activity Tolerance Patient tolerated treatment well   Patient Left in bed;with call bell/phone within reach   Nurse Communication Mobility status        Time: 9021-1155 OT Time Calculation (min): 21 min  Charges: OT General Charges $OT Visit: 1 Procedure OT Treatments $Self Care/Home Management : 8-22 mins  [name] M 05/01/2015, 3:15 PM

## 2015-05-01 NOTE — Progress Notes (Signed)
      [address]       [place] [address]             [phone]        CARDIOTHORACIC SURGERY PROGRESS NOTE   R6 Days Post-Op Procedure(s) (LRB): MITRAL VALVE  REPLACEMENT using a 27 mm Edwards Perimount Magna Mitral Ease Valve (N/A) TRICUSPID VALVE REPAIR (N/A) CORONARY ARTERY BYPASS GRAFTING (CABG) x ONE, using left internal mammary artery (N/A) MAZE (N/A) TRANSESOPHAGEAL ECHOCARDIOGRAM (TEE) (N/A) CLIPPING OF ATRIAL APPENDAGE (N/A)  Subjective: Looks and feels better.  Still very weak.  Denies pain or SOB.  Appetite improving  Objective: Vital signs: BP Readings from Last 1 Encounters:  05/01/15 152/68   Pulse Readings from Last 1 Encounters:  05/01/15 81   Resp Readings from Last 1 Encounters:  05/01/15 19   Temp Readings from Last 1 Encounters:  05/01/15 97.5 F (36.4 C) Oral    Hemodynamics:    Physical Exam:  Rhythm:   Atypical Aflutter w/ HR 40-50 - VVI pacing  Breath sounds: Fairly clear  Heart sounds:  RRR w/out murmur  Incisions:  Clean and dry  Abdomen:  Soft, non-distended, non-tender  Extremities:  Warm, well-perfused    Intake/Output from previous day: 08/21 0701 - 08/22 0700 In: 1502.4 [P.O.:740; I.V.:512.4; IV Piggyback:250] Out: 7416 [Urine:1470; Stool:1; Chest Tube:90] Intake/Output this shift:    Lab Results:  CBC: Recent Labs  04/29/15 0519  04/30/15 1735 05/01/15 0411  WBC 12.7*  --   --  11.9*  HGB 10.0*  < > 11.6* 10.1*  HCT 31.0*  < > 34.0* 31.9*  PLT 136*  --   --  203  < > = values in this interval not displayed.  BMET:  Recent Labs  04/30/15 0427 04/30/15 1735 05/01/15 0411  NA 141 137 138  K 3.5 3.7 3.9  CL 93* 91* 93*  CO2 38*  --  37*  GLUCOSE 152* 147* 130*  BUN 11 17 13   CREATININE 0.59 0.80 0.56  CALCIUM 8.3*  --  8.4*     PT/INR:   Recent Labs  05/01/15 0411  LABPROT 17.5*  INR 1.42    CBG (last 3)   Recent Labs  04/29/15 0915 04/29/15 1254 04/29/15 1648  GLUCAP  248* 114* 132*    ABG    Component Value Date/Time   PHART 7.391 04/26/2015 0952   PCO2ART 42.7 04/26/2015 0952   PO2ART 55.0* 04/26/2015 0952   HCO3 25.9* 04/26/2015 0952   TCO2 34 04/30/2015 1735   ACIDBASEDEF 3.0* 04/26/2015 0832   O2SAT 61.7 04/29/2015 0710    CXR: Stable mild bibasilar atelectasis  Assessment/Plan: S/P Procedure(s) (LRB): MITRAL VALVE  REPLACEMENT using a 27 mm Edwards Perimount Magna Mitral Ease Valve (N/A) TRICUSPID VALVE REPAIR (N/A) CORONARY ARTERY BYPASS GRAFTING (CABG) x ONE, using left internal mammary artery (N/A) MAZE (N/A) TRANSESOPHAGEAL ECHOCARDIOGRAM (TEE) (N/A) CLIPPING OF ATRIAL APPENDAGE (N/A)  Overall looks good but still bradycardic under pacer now POD6 Generalized weakness and physical deconditioning Expected post op acute blood loss anemia, stable Expected post op volume excess, very mild, weight now reportedly below pre-op baseline Expected post op atelectasis, mild Post op thrombocytopenia, resolved   Consult EPS for permanent pacemaker  Mobilize  PT  Continue warfarin 2 mg/day  Keep in ICU until pacer in place   Rexene Alberts 05/01/2015 7:27 AM

## 2015-05-01 NOTE — Progress Notes (Signed)
Physical Therapy Treatment Patient Details Name: [name] MRN: [id number] DOB: Jun 01, 1935 Today's Date: 05/01/2015    History of Present Illness Adm 04/25/15 for MVR, tricuspid valve repair, CABG x 1, and clipping of atrial appendage. Post-op hypotension requiring pressors and N/V PMHx- COPD (home O2), CHF, Menieres, HTN, Rt breast Ca; bil cochlear implants    PT Comments    Pt very encouraged to ambulate out of room, a total of 67' with RW and min A. VSS throughout. Pt possibly going for pacer soon. PT will continue to follow.   Follow Up Recommendations  Home health PT;Supervision for mobility/OOB     Equipment Recommendations  Rolling walker with 5" wheels    Recommendations for Other Services       Precautions / Restrictions Precautions Precautions: Sternal;Fall Precaution Comments: pt able to demonstrate adequate keeping of sternal precautions Restrictions Weight Bearing Restrictions: Yes Other Position/Activity Restrictions: external defibrillator    Mobility  Bed Mobility               General bed mobility comments: pt received and returned to chair  Transfers Overall transfer level: Needs assistance Equipment used: Rolling walker (2 wheeled) Transfers: Sit to/from Stand Sit to Stand: Min assist         General transfer comment: pt placed hands on arms of chair but minimal pushing down with sit to stand and only min A needed for power up today. Pt has had catheter out and has transferred to [facility] multiple times today  Ambulation/Gait Ambulation/Gait assistance: Min assist Ambulation Distance (Feet): 47 Feet Assistive device: Rolling walker (2 wheeled) Gait Pattern/deviations: Step-through pattern;Decreased stride length;Trunk flexed;Shuffle Gait velocity: decreased Gait velocity interpretation: <1.8 ft/sec, indicative of risk for recurrent falls General Gait Details: drifts left, cues for not running into objects on left side with RW. Pt very  excited to ambulate out of room. Ambulated on 4L O2 with sats stable at 96-97%. HR 70 bpm. Chair brought behind pt but no buckling of knees today. 10 sec standing rest break every 5'   Stairs            Wheelchair Mobility    Modified Rankin (Stroke Patients Only)       Balance Overall balance assessment: Needs assistance         Standing balance support: Bilateral upper extremity supported Standing balance-Leahy Scale: Poor                      Cognition Arousal/Alertness: Awake/alert Behavior During Therapy: WFL for tasks assessed/performed Overall Cognitive Status: Within Functional Limits for tasks assessed                      Exercises Other Exercises Other Exercises: pt may be going for pacer soon    General Comments        Pertinent Vitals/Pain Pain Assessment: No/denies pain    Home Living                      Prior Function            PT Goals (current goals can now be found in the care plan section) Acute Rehab PT Goals Patient Stated Goal: be able to go home with spouse assist PT Goal Formulation: With patient Time For Goal Achievement: 05/05/15 Potential to Achieve Goals: Good Progress towards PT goals: Progressing toward goals    Frequency  Min 3X/week    PT Plan Current plan  remains appropriate    Co-evaluation             End of Session Equipment Utilized During Treatment: Gait belt;Oxygen Activity Tolerance: Patient tolerated treatment well Patient left: in chair;with call bell/phone within reach     Time: 1257-1322 PT Time Calculation (min) (ACUTE ONLY): 25 min  Charges:  $Gait Training: 23-37 mins                    G Codes:     Leighton Roach, PT  Acute Rehab Services  [place], [place] 05/01/2015, 2:51 PM

## 2015-05-02 ENCOUNTER — Encounter (HOSPITAL_COMMUNITY)
Admission: RE | Disposition: A | Discharge status: Skilled Nursing Facility | Payer: Self-pay | Source: Ambulatory Visit | Attending: Thoracic Surgery (Cardiothoracic Vascular Surgery)

## 2015-05-02 DIAGNOSIS — I4891 Unspecified atrial fibrillation: Secondary | ICD-10-CM

## 2015-05-02 DIAGNOSIS — I442 Atrioventricular block, complete: Secondary | ICD-10-CM

## 2015-05-02 HISTORY — PX: EP IMPLANTABLE DEVICE: SHX172B

## 2015-05-02 LAB — PROTIME-INR
INR: 1.62 — ABNORMAL HIGH (ref 0.00–1.49)
Prothrombin Time: 19.3 seconds — ABNORMAL HIGH (ref 11.6–15.2)

## 2015-05-02 SURGERY — PACEMAKER IMPLANT
Anesthesia: LOCAL

## 2015-05-02 MED ORDER — MIDAZOLAM HCL 5 MG/5ML IJ SOLN
INTRAMUSCULAR | Status: AC
  Filled 2015-05-02: qty 25

## 2015-05-02 MED ORDER — LOSARTAN POTASSIUM 50 MG PO TABS: 50.0000 mg | ORAL_TABLET | Freq: Every day | ORAL | Status: DC

## 2015-05-02 MED ORDER — ONDANSETRON HCL 4 MG/2ML IJ SOLN: 4.0000 mg | Freq: Four times a day (QID) | INTRAMUSCULAR | Status: DC | PRN

## 2015-05-02 MED ORDER — SODIUM CHLORIDE 0.9 % IV SOLN: 250.0000 mL | INTRAVENOUS | Status: DC | PRN

## 2015-05-02 MED ORDER — SODIUM CHLORIDE 0.9 % IJ SOLN
3.0000 mL | INTRAMUSCULAR | Status: DC | PRN
Start: 2015-05-02 — End: 2015-05-04

## 2015-05-02 MED ORDER — ACETAMINOPHEN 325 MG PO TABS
325.0000 mg | ORAL_TABLET | ORAL | Status: DC | PRN
Start: 2015-05-02 — End: 2015-05-09

## 2015-05-02 MED ORDER — LOSARTAN POTASSIUM 50 MG PO TABS
50.0000 mg | ORAL_TABLET | Freq: Every day | ORAL | Status: DC
  Administered 2015-05-03 – 2015-05-05 (×3): 50 mg via ORAL
  Filled 2015-05-02 (×4): qty 1

## 2015-05-02 MED ORDER — VANCOMYCIN HCL IN DEXTROSE 1-5 GM/200ML-% IV SOLN
1000.0000 mg | Freq: Two times a day (BID) | INTRAVENOUS | Status: AC
Start: 2015-05-03 — End: 2015-05-03
  Administered 2015-05-03: 1000 mg via INTRAVENOUS
  Filled 2015-05-02: qty 200

## 2015-05-02 MED ORDER — SODIUM CHLORIDE 0.9 % IJ SOLN
3.0000 mL | Freq: Two times a day (BID) | INTRAMUSCULAR | Status: DC
  Administered 2015-05-02 – 2015-05-04 (×4): 3 mL via INTRAVENOUS

## 2015-05-02 MED ORDER — SODIUM CHLORIDE 0.9 % IJ SOLN
3.0000 mL | Freq: Two times a day (BID) | INTRAMUSCULAR | Status: DC
  Administered 2015-05-03 – 2015-05-04 (×2): 3 mL via INTRAVENOUS
  Administered 2015-05-04: 10 mL via INTRAVENOUS
  Administered 2015-05-05 – 2015-05-10 (×5): 3 mL via INTRAVENOUS

## 2015-05-02 MED ORDER — MOVING RIGHT ALONG BOOK
Freq: Once | Status: AC
  Administered 2015-05-02: 1
  Filled 2015-05-02: qty 1

## 2015-05-02 MED ORDER — SODIUM CHLORIDE 0.9 % IJ SOLN: 3.0000 mL | INTRAMUSCULAR | Status: DC | PRN

## 2015-05-02 MED ORDER — LIDOCAINE HCL (PF) 1 % IJ SOLN
INTRAMUSCULAR | Status: DC | PRN
  Administered 2015-05-02: 50 mL

## 2015-05-02 MED ORDER — ONDANSETRON HCL 4 MG/2ML IJ SOLN
INTRAMUSCULAR | Status: DC | PRN
  Administered 2015-05-02: 4 mg via INTRAVENOUS

## 2015-05-02 MED ORDER — LOSARTAN POTASSIUM 50 MG PO TABS
100.0000 mg | ORAL_TABLET | Freq: Every day | ORAL | Status: DC
Start: 2015-05-02 — End: 2015-05-02
  Administered 2015-05-02: 100 mg via ORAL
  Filled 2015-05-02: qty 2

## 2015-05-02 MED ORDER — ASPIRIN EC 81 MG PO TBEC
81.0000 mg | DELAYED_RELEASE_TABLET | Freq: Every day | ORAL | Status: DC
  Administered 2015-05-02 – 2015-05-11 (×10): 81 mg via ORAL
  Filled 2015-05-02 (×12): qty 1

## 2015-05-02 MED ORDER — FENTANYL CITRATE (PF) 100 MCG/2ML IJ SOLN
INTRAMUSCULAR | Status: AC
  Filled 2015-05-02: qty 4

## 2015-05-02 MED ORDER — LIDOCAINE HCL (PF) 1 % IJ SOLN
INTRAMUSCULAR | Status: AC
  Filled 2015-05-02: qty 60

## 2015-05-02 MED ORDER — SODIUM CHLORIDE 0.9 % IR SOLN
Status: AC
  Filled 2015-05-02: qty 2

## 2015-05-02 MED ORDER — HYDROCODONE-ACETAMINOPHEN 5-325 MG PO TABS: 1.0000 | ORAL_TABLET | ORAL | Status: DC | PRN

## 2015-05-02 SURGICAL SUPPLY — 7 items
CABLE SURGICAL S-101-97-12 (CABLE) ×2 IMPLANT
LEAD CAPSURE NOVUS 45CM (Lead) ×2 IMPLANT
LEAD CAPSURE NOVUS 5076-52CM (Lead) ×2 IMPLANT
PAD DEFIB LIFELINK (PAD) ×2 IMPLANT
PPM ADVISA MRI DR A2DR01 (Pacemaker) ×2 IMPLANT
SHEATH CLASSIC 7F (SHEATH) ×4 IMPLANT
TRAY PACEMAKER INSERTION (CUSTOM PROCEDURE TRAY) ×2 IMPLANT

## 2015-05-02 NOTE — Care Management Important Message (Signed)
Important Message  Patient Details  Name: [name] MRN: [id number] Date of Birth: 09-Jan-1935   Medicare Important Message Given:  Yes-second notification given    [name], RN 05/02/2015, 3:15 PM

## 2015-05-02 NOTE — Care Management Note (Signed)
Case Management Note  Patient Details  Name: [name] MRN: [id number] Date of Birth: [birth year]  Subjective/Objective:    Pt lives with spouse who will provide 24/7 assistance upon discharge.  PT/OT recommend home therapy, 3-N-1, and rolling walker, pt and spouse agree.  Provided list of agencies in [facility], referral made to [place] per request.                            Expected Discharge Plan:  [place]  Discharge planning Services  CM Consult  Post Acute Care Choice:  Durable Medical Equipment, Home Health Choice offered to:  Spouse  DME Arranged:  3-N-1, Walker rolling DME Agency:  [place] Arranged:  PT, OT [facility] Agency:  [place]  Status of Service:  In process, will continue to follow  Medicare Important Message Given:  Yes-second notification given  Additional Comments:  [name], RN 05/02/2015, 3:39 PM

## 2015-05-02 NOTE — Consult Note (Signed)
Cardiologist:  Fletcher Anon  Reason for Consult: Bradycardia   [name] is an 80 y.o. female.  HPI:   The patient is a 80 year old female with history of hypertension, COPD, hyperlipidemia, atrial fibrillation, mitral regurgitation, COPD, hypothyroidism, breast cancer, pulmonary hypertension, tricuspid regurgitation,, chronic combined systolic and diastolic heart failure.  On 04/25/2015 the patient underwent mitral valve replacement with a bioprosthetic tissue valve, tricuspid valve repair with an Edwards MC 3 ring angioplasty, maze procedure, coronary artery bypass grafting 1 with a LIMA to the LAD.   Patient currently has a temporary pacemaker after the surgery.  They're unable to wean her off of it.    Today, feeling well wihtout complaint.  Continues to be V paced with junctional rhythm underlying.  No obvious P waves on telemetry.  Past Medical History  Diagnosis Date  . Hypertension   . COPD (chronic obstructive pulmonary disease)   . Hyperlipidemia   . Meniere disease   . HX: breast cancer   . Acute CHF (congestive heart failure)   . Atrial fibrillation   . Mitral regurgitation   . COPD (chronic obstructive pulmonary disease)   . Heart murmur     as child  . Hypothyroidism   . Cancer     hx breast cancer  . Breast cancer 08/09/2001    right/rad  . Pulmonary hypertension   . Tricuspid regurgitation   . Chronic combined systolic and diastolic CHF (congestive heart failure)   . Coronary artery disease   . PONV (postoperative nausea and vomiting)   . Shortness of breath dyspnea     on exertion  . Anxiety   . GERD (gastroesophageal reflux disease)   . Arthritis   . s/p mitral valve replacement with bioprosthetic tissue valve 04/25/2015    27 mm [facility] Mitral bovine bioprosthetic tissue valve  . 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 S/P Maze operation for atrial fibrillation 04/25/2015    Complete bilateral atrial lesion  set using cryothermy and bipolar radiofrequency ablation with clipping of LA appendage  . S/P CABG x 1 04/25/2015    LIMA to LAD    Past Surgical History  Procedure Laterality Date  . Cochlear implant Bilateral   . Mastectomy, partial Right   . Cardiac catheterization  11/2013    [facility]  . Cardiac catheterization  10/2014    [facility]  . Breast biopsy Left 11/25/06    neg  . Breast biopsy Left 01/20/12    lt bx /clip-neg  . Eye surgery Bilateral 2014    cataract surgery  . Mitral valve repair N/A 04/25/2015    Procedure: MITRAL VALVE  REPLACEMENT using a 27 mm Edwards Perimount Magna Mitral Ease Valve;  Surgeon: Rexene Alberts, MD;  Location: [place];  Service: Open Heart Surgery;  Laterality: N/A;  . Tricuspid valve replacement N/A 04/25/2015    Procedure: TRICUSPID VALVE REPAIR;  Surgeon: Rexene Alberts, MD;  Location: [place];  Service: Open Heart Surgery;  Laterality: N/A;  . Coronary artery bypass graft N/A 04/25/2015    Procedure: CORONARY ARTERY BYPASS GRAFTING (CABG) x ONE, using left internal mammary artery;  Surgeon: Rexene Alberts, MD;  Location: [place];  Service: Open Heart Surgery;  Laterality: N/A;  . Maze N/A 04/25/2015    Procedure: MAZE;  Surgeon: Rexene Alberts, MD;  Location: [place];  Service: Open Heart Surgery;  Laterality: N/A;  . Tee without cardioversion N/A  04/25/2015    Procedure: TRANSESOPHAGEAL ECHOCARDIOGRAM (TEE);  Surgeon: Rexene Alberts, MD;  Location: [place];  Service: Open Heart Surgery;  Laterality: N/A;  . Clipping of atrial appendage N/A 04/25/2015    Procedure: CLIPPING OF ATRIAL APPENDAGE;  Surgeon: Rexene Alberts, MD;  Location: [place];  Service: Open Heart Surgery;  Laterality: N/A;    Family History  Problem Relation Age of Onset  . Heart disease Father   . Heart disease Brother   . Heart attack Paternal Uncle     Social History:  reports that she quit smoking about 17 years ago. Her smoking use included Cigarettes. She quit after 40 years of use. She  does not have any smokeless tobacco history on file. She reports that she drinks alcohol. She reports that she does not use illicit drugs.  Allergies:  Allergies  Allergen Reactions  . Augmentin [Amoxicillin-Pot Clavulanate] Swelling    Swollen joints, pain  . Nifedipine     Other reaction(s): Unknown  . Penicillins     Other reaction(s): Other (See Comments) Swollen joints  . Propranolol     Other reaction(s): Unknown  . Ace Inhibitors     Other reaction(s): Cough  . Diazepam     Other reaction(s): Other (See Comments) Made her "hyper"  . Meperidine     Other reaction(s): Other (See Comments) Made her "hyper"  . Propoxyphene     Other reaction(s): Other (See Comments) Made her "hyper"    Medications: Scheduled Meds: . aspirin EC  325 mg Oral Daily  . bisacodyl  10 mg Oral Daily   Or  . bisacodyl  10 mg Rectal Daily  . docusate sodium  200 mg Oral Daily  . furosemide  40 mg Oral Daily  . gentamicin irrigation  80 mg Irrigation To Cath  . levothyroxine  88 mcg Oral QAC breakfast  . metoCLOPramide (REGLAN) injection  10 mg Intravenous 4 times per day  . pantoprazole  40 mg Oral Daily  . sodium chloride  10-40 mL Intracatheter Q12H  . spironolactone  25 mg Oral Daily  . vancomycin  1,000 mg Intravenous To Cath  . warfarin  2 mg Oral q1800  . Warfarin - Physician Dosing Inpatient   Does not apply q1800   Continuous Infusions: . sodium chloride 20 mL/hr at 05/01/15 0600  . sodium chloride     PRN Meds:.ipratropium-albuterol, metoprolol, ondansetron (ZOFRAN) IV, oxyCODONE, sodium chloride, traMADol   Results for orders placed or performed during the hospital encounter of 04/25/15 (from the past 48 hour(s))  I-STAT, chem 8     Status: Abnormal   Collection Time: 04/30/15  5:35 PM  Result Value Ref Range   Sodium 137 135 - 145 mmol/L   Potassium 3.7 3.5 - 5.1 mmol/L   Chloride 91 (L) 101 - 111 mmol/L   BUN 17 6 - 20 mg/dL   Creatinine, Ser 0.80 0.44 - 1.00 mg/dL    Glucose, Bld 147 (H) 65 - 99 mg/dL   Calcium, Ion 1.13 1.13 - 1.30 mmol/L   TCO2 34 0 - 100 mmol/L   Hemoglobin 11.6 (L) 12.0 - 15.0 g/dL   HCT 34.0 (L) 36.0 - 46.0 %  Protime-INR     Status: Abnormal   Collection Time: 05/01/15  4:11 AM  Result Value Ref Range   Prothrombin Time 17.5 (H) 11.6 - 15.2 seconds   INR 1.42 0.00 - 1.49  CBC     Status: Abnormal   Collection Time: 05/01/15  4:11 AM  Result Value Ref Range   WBC 11.9 (H) 4.0 - 10.5 K/uL   RBC 3.40 (L) 3.87 - 5.11 MIL/uL   Hemoglobin 10.1 (L) 12.0 - 15.0 g/dL   HCT 31.9 (L) 36.0 - 46.0 %   MCV 93.8 78.0 - 100.0 fL   MCH 29.7 26.0 - 34.0 pg   MCHC 31.7 30.0 - 36.0 g/dL   RDW 16.9 (H) 11.5 - 15.5 %   Platelets 203 150 - 400 K/uL  Basic metabolic panel     Status: Abnormal   Collection Time: 05/01/15  4:11 AM  Result Value Ref Range   Sodium 138 135 - 145 mmol/L   Potassium 3.9 3.5 - 5.1 mmol/L   Chloride 93 (L) 101 - 111 mmol/L   CO2 37 (H) 22 - 32 mmol/L   Glucose, Bld 130 (H) 65 - 99 mg/dL   BUN 13 6 - 20 mg/dL   Creatinine, Ser 0.56 0.44 - 1.00 mg/dL   Calcium 8.4 (L) 8.9 - 10.3 mg/dL   GFR calc non Af Amer >60 >60 mL/min   GFR calc Af Amer >60 >60 mL/min    Comment: (NOTE) The eGFR has been calculated using the CKD EPI equation. This calculation has not been validated in all clinical situations. eGFR's persistently <60 mL/min signify possible Chronic Kidney Disease.    Anion gap 8 5 - 15  TSH     Status: Abnormal   Collection Time: 05/01/15  3:00 PM  Result Value Ref Range   TSH 6.238 (H) 0.350 - 4.500 uIU/mL  Protime-INR     Status: Abnormal   Collection Time: 05/02/15  3:57 AM  Result Value Ref Range   Prothrombin Time 19.3 (H) 11.6 - 15.2 seconds   INR 1.62 (H) 0.00 - 1.49    Dg Chest Port 1 View  05/01/2015   CLINICAL DATA:  CABG.  Prior cardiac valve replacements.  EXAM: PORTABLE CHEST - 1 VIEW  COMPARISON:  04/30/2015 .  FINDINGS: Several removal of left chest tube. Right PICC line in stable  position . Prior CABG. Prior mitral and tricuspid valve replacements. Mild bilateral from interstitial prominence and pleural effusions noted. Very mild component congestive heart failure cannot be excluded. Basilar atelectasis. No pneumothorax.  IMPRESSION: 1. Interval removal of left chest tube. Right PICC line stable position. 2. Prior CABG. Prior mitral and tricuspid valve replacements. Mild interstitial prominence and bilateral effusions noted. A mild component of congestive heart failure. 3. Low lung volumes with basilar atelectasis.   Electronically Signed   By: Marcello Moores  Register   On: 05/01/2015 07:35    ROS  The patient currently denies vomiting, fever, shortness of breath, orthopnea, dizziness, PND, cough, congestion, abdominal pain, hematochezia, melena, lower extremity edema, claudication.   Blood pressure 158/70, pulse 70, temperature 98.1 F (36.7 C), temperature source Oral, resp. rate 28, height 5' 2"  (1.575 m), weight 145 lb 8.1 oz (66 kg), SpO2 99 %. Physical Exam  Nursing note and vitals reviewed. Constitutional: She is oriented to person, place, and time. She appears well-developed and well-nourished. No distress.  Patient appears comfortable lying in bed.  HENT:  Head: Normocephalic and atraumatic.  Eyes: EOM are normal. Pupils are equal, round, and reactive to light. No scleral icterus.  Neck: Normal range of motion. No JVD present.  Cardiovascular: Normal rate, regular rhythm, S1 normal and S2 normal.   No murmur heard. Pulses:      Radial pulses are 2+ on the right side, and  2+ on the left side.       Dorsalis pedis pulses are 2+ on the right side, and 2+ on the left side.  Respiratory: Effort normal and breath sounds normal. She has no wheezes. She has no rales.  GI: Soft. Bowel sounds are normal. She exhibits no distension. There is no tenderness.  Musculoskeletal: She exhibits no edema.  Lymphadenopathy:    She has no cervical adenopathy.  Neurological: She is  alert and oriented to person, place, and time. She exhibits normal muscle tone.  Skin: Skin is warm.  Psychiatric: She has a normal mood and affect.   8/12 personal review of ECG: Sinus rhythm, 1 degree AV block, LAD  Assessment/Plan: Principal Problem:   S/P mitral valve replacement with bioprosthetic valve, tricuspid valve repair, maze procedure and CABG x1 Active Problems:   Atrial fibrillation, unspecified   Chronic systolic heart failure   Essential hypertension   Mitral regurgitation   Severe mitral regurgitation   Chronic systolic congestive heart failure   Pulmonary hypertension   Tricuspid regurgitation   Chronic combined systolic and diastolic CHF (congestive heart failure)   Coronary artery disease   S/P tricuspid valve repair   S/P Maze operation for atrial fibrillation   S/P CABG x 1   Assessment/Plan:  Jucntional Bradycardia: Continues to be V paced with junctional underlying.  No acute complaints today.  Discussed risk and benefits of the procedure including bleeding, infection, pneumothorax, and tamponade.  Patient states that she understands the risks and wishes to proceed with pacemaker implant.   Atrial Fibrillation:  Is on coumadin and will continue for documented atrial fibrillation with goal INR 2-3.     Will Camnitz 05/02/2015 07:25

## 2015-05-02 NOTE — Progress Notes (Signed)
      [address]       [place] [address]             [phone]        CARDIOTHORACIC SURGERY PROGRESS NOTE   R7 Days Post-Op Procedure(s) (LRB): MITRAL VALVE  REPLACEMENT using a 27 mm Edwards Perimount Magna Mitral Ease Valve (N/A) TRICUSPID VALVE REPAIR (N/A) CORONARY ARTERY BYPASS GRAFTING (CABG) x ONE, using left internal mammary artery (N/A) MAZE (N/A) TRANSESOPHAGEAL ECHOCARDIOGRAM (TEE) (N/A) CLIPPING OF ATRIAL APPENDAGE (N/A)  Subjective: Feels better.  Slightly stronger.  Objective: Vital signs: BP Readings from Last 1 Encounters:  05/02/15 173/76   Pulse Readings from Last 1 Encounters:  05/02/15 70   Resp Readings from Last 1 Encounters:  05/02/15 19   Temp Readings from Last 1 Encounters:  05/02/15 98.1 F (36.7 C) Oral    Hemodynamics:    Physical Exam:  Rhythm:   VVI paced  Breath sounds: clear  Heart sounds:  RRR  Incisions:  Clean and dry  Abdomen:  Soft, non-distended, non-tender  Extremities:  Warm, well-perfused    Intake/Output from previous day: 08/22 0701 - 08/23 0700 In: 786 [P.O.:300; I.V.:420; IV Piggyback:150] Out: 1100 [Urine:1100] Intake/Output this shift:    Lab Results:  CBC: Recent Labs  04/30/15 1735 05/01/15 0411  WBC  --  11.9*  HGB 11.6* 10.1*  HCT 34.0* 31.9*  PLT  --  203    BMET:  Recent Labs  04/30/15 0427 04/30/15 1735 05/01/15 0411  NA 141 137 138  K 3.5 3.7 3.9  CL 93* 91* 93*  CO2 38*  --  37*  GLUCOSE 152* 147* 130*  BUN 11 17 13   CREATININE 0.59 0.80 0.56  CALCIUM 8.3*  --  8.4*     PT/INR:   Recent Labs  05/02/15 0357  LABPROT 19.3*  INR 1.62*    CBG (last 3)   Recent Labs  04/29/15 0915 04/29/15 1254 04/29/15 1648  GLUCAP 248* 114* 132*    ABG    Component Value Date/Time   PHART 7.391 04/26/2015 0952   PCO2ART 42.7 04/26/2015 0952   PO2ART 55.0* 04/26/2015 0952   HCO3 25.9* 04/26/2015 0952   TCO2 34 04/30/2015 1735   ACIDBASEDEF 3.0*  04/26/2015 0832   O2SAT 61.7 04/29/2015 0710    CXR: n/a  Assessment/Plan: S/P Procedure(s) (LRB): MITRAL VALVE  REPLACEMENT using a 27 mm Edwards Perimount Magna Mitral Ease Valve (N/A) TRICUSPID VALVE REPAIR (N/A) CORONARY ARTERY BYPASS GRAFTING (CABG) x ONE, using left internal mammary artery (N/A) MAZE (N/A) TRANSESOPHAGEAL ECHOCARDIOGRAM (TEE) (N/A) CLIPPING OF ATRIAL APPENDAGE (N/A)  Overall stable but remains bradycardic under pacer Generalized weakness and physical deconditioning Expected post op acute blood loss anemia, stable Expected post op volume excess, very mild, weight now reportedly below pre-op baseline Expected post op atelectasis, mild Post op thrombocytopenia, resolved   For permanent pacer today  Possible transfer to step down once pacer in place  Mobilize  PT  Low dose warfarin  Rexene Alberts 05/02/2015 8:34 AM

## 2015-05-03 ENCOUNTER — Inpatient Hospital Stay (HOSPITAL_COMMUNITY): Payer: Medicare PPO

## 2015-05-03 ENCOUNTER — Encounter (HOSPITAL_COMMUNITY): Payer: Self-pay | Admitting: Internal Medicine

## 2015-05-03 DIAGNOSIS — R001 Bradycardia, unspecified: Secondary | ICD-10-CM | POA: Diagnosis not present

## 2015-05-03 DIAGNOSIS — Z950 Presence of cardiac pacemaker: Secondary | ICD-10-CM | POA: Insufficient documentation

## 2015-05-03 DIAGNOSIS — I442 Atrioventricular block, complete: Secondary | ICD-10-CM | POA: Diagnosis not present

## 2015-05-03 DIAGNOSIS — I9789 Other postprocedural complications and disorders of the circulatory system, not elsewhere classified: Secondary | ICD-10-CM

## 2015-05-03 LAB — BASIC METABOLIC PANEL
Anion gap: 10 (ref 5–15)
BUN: 13 mg/dL (ref 6–20)
CHLORIDE: 95 mmol/L — AB (ref 101–111)
CO2: 33 mmol/L — AB (ref 22–32)
CREATININE: 0.75 mg/dL (ref 0.44–1.00)
Calcium: 8.3 mg/dL — ABNORMAL LOW (ref 8.9–10.3)
GFR calc Af Amer: >60 mL/min (ref >60)
GFR calc non Af Amer: >60 mL/min (ref >60)
GLUCOSE: 104 mg/dL — AB (ref 65–99)
POTASSIUM: 4.1 mmol/L (ref 3.5–5.1)
Sodium: 138 mmol/L (ref 135–145)

## 2015-05-03 LAB — CBC
HEMATOCRIT: 28.8 % — AB (ref 36.0–46.0)
Hemoglobin: 9 g/dL — ABNORMAL LOW (ref 12.0–15.0)
MCH: 29.7 pg (ref 26.0–34.0)
MCHC: 31.3 g/dL (ref 30.0–36.0)
MCV: 95 fL (ref 78.0–100.0)
Platelets: 221 10*3/uL (ref 150–400)
RBC: 3.03 MIL/uL — ABNORMAL LOW (ref 3.87–5.11)
RDW: 16.9 % — AB (ref 11.5–15.5)
WBC: 10.7 10*3/uL — ABNORMAL HIGH (ref 4.0–10.5)

## 2015-05-03 LAB — PROTIME-INR
INR: 1.93 — ABNORMAL HIGH (ref 0.00–1.49)
Prothrombin Time: 21.9 seconds — ABNORMAL HIGH (ref 11.6–15.2)

## 2015-05-03 MED ORDER — AMIODARONE HCL 200 MG PO TABS
200.0000 mg | ORAL_TABLET | Freq: Two times a day (BID) | ORAL | Status: DC
  Filled 2015-05-03 (×2): qty 1

## 2015-05-03 MED ORDER — AMIODARONE HCL 200 MG PO TABS
200.0000 mg | ORAL_TABLET | Freq: Two times a day (BID) | ORAL | Status: DC
  Administered 2015-05-03 – 2015-05-11 (×17): 200 mg via ORAL
  Filled 2015-05-03 (×22): qty 1

## 2015-05-03 NOTE — Progress Notes (Deleted)
Pt profile: 80 y.o. female with a history of valvular heart disease s/p MVR and tricuspid valve repair with postoperative AV block. The patient has been observed without return of conduction- PPM placed yesterday.    Subjective: No complaints   Objective: Vital signs in last 24 hours: Temp:  [97.8 F (36.6 C)-98.4 F (36.9 C)] 97.8 F (36.6 C) (08/24 0728) Pulse Rate:  [0-136] 78 (08/24 1000) Resp:  [0-30] 23 (08/24 1000) BP: (92-176)/(37-74) 140/65 mmHg (08/24 1000) SpO2:  [0 %-100 %] 97 % (08/24 1000) FiO2 (%):  [50 %] 50 % (08/24 0016) Weight:  [147 lb 0.8 oz (66.7 kg)] 147 lb 0.8 oz (66.7 kg) (08/24 0500) Weight change: 1 lb 8.7 oz (0.7 kg) Last BM Date: 04/30/15 Intake/Output from previous day: +380 08/23 0701 - 08/24 0700 In: 600 [I.V.:400; IV Piggyback:200] Out: 200 [Urine:200] Intake/Output this shift: Total I/O In: -  Out: 250 [Urine:250]  PE: General:Pleasant affect, NAD Skin:Warm and dry, brisk capillary refill HEENT:normocephalic, sclera clear, mucus membranes moist Heart:S1S2 RRR without murmur, gallup, rub or click Pacer site, without hematoma, pressure dressing removed, op site with 4X4 left.  Lungs: without rales, + rhonchi, or wheezes VOH:YWVP, non tender, + BS, do not palpate liver spleen or masses Ext:no lower ext edema, 2+ pedal pulses, 2+ radial pulses Neuro:alert and oriented, MAE, follows commands, + facial symmetry Tele:  A fib with pacing.    Lab Results:  Recent Labs  05/01/15 0411 05/03/15 0500  WBC 11.9* 10.7*  HGB 10.1* 9.0*  HCT 31.9* 28.8*  PLT 203 221   BMET  Recent Labs  05/01/15 0411 05/03/15 0500  NA 138 138  K 3.9 4.1  CL 93* 95*  CO2 37* 33*  GLUCOSE 130* 104*  BUN 13 13  CREATININE 0.56 0.75  CALCIUM 8.4* 8.3*   No results for input(s): TROPONINI in the last 72 hours.  Invalid input(s): CK, MB  No results found for: CHOL, HDL, LDLCALC, LDLDIRECT, TRIG, CHOLHDL Lab Results  Component Value Date     HGBA1C 6.3* 04/21/2015     Lab Results  Component Value Date   TSH 6.238* 05/01/2015       Studies/Results: Dg Chest 2 View  05/03/2015   CLINICAL DATA:  Atelectasis.  EXAM: CHEST  2 VIEW  COMPARISON:  05/01/2015 .  04/30/2015.  FINDINGS: Left PICC line stable position. Mediastinum and hilar structures are stable. Cardiac pacer in stable position. Prior CABG. Prior mitral and tricuspid valve replacements. Stable cardiomegaly with mild interstitial prominence and bilateral effusions noted consistent mild congestive heart failure. Low lung volumes with basilar atelectasis. Calcified pulmonary nodule right mid lung. No pneumothorax. Apical pleural thickening noted consistent scarring. No acute bony abnormality.  IMPRESSION: 1.  PICC line stable position.  2. Cardiac pacer in stable position. Prior CABG. Prior mitral and tricuspid valve replacements. Persistent severe cardiomegaly. Mild pulmonary venous congestion, bilateral interstitial prominence, and small pleural effusions again noted consistent with mild congestive heart failure. Similar findings noted on prior exam.  3.  Low lung volumes with bibasilar subsegmental atelectasis.   Electronically Signed   By: Marcello Moores  Register   On: 05/03/2015 07:36    Medications: I have reviewed the patient's current medications. Scheduled Meds: . amiodarone  200 mg Oral BID  . aspirin EC  81 mg Oral Daily  . bisacodyl  10 mg Oral Daily   Or  . bisacodyl  10 mg Rectal Daily  . docusate sodium  200  mg Oral Daily  . furosemide  40 mg Oral Daily  . levothyroxine  88 mcg Oral QAC breakfast  . losartan  50 mg Oral Daily  . metoCLOPramide (REGLAN) injection  10 mg Intravenous 4 times per day  . pantoprazole  40 mg Oral Daily  . sodium chloride  10-40 mL Intracatheter Q12H  . sodium chloride  3 mL Intravenous Q12H  . sodium chloride  3 mL Intravenous Q12H  . spironolactone  25 mg Oral Daily  . warfarin  2 mg Oral q1800  . Warfarin - Physician Dosing  Inpatient   Does not apply q1800   Continuous Infusions: . sodium chloride Stopped (05/03/15 0700)   PRN Meds:.sodium chloride, sodium chloride, acetaminophen, HYDROcodone-acetaminophen, ipratropium-albuterol, metoprolol, ondansetron (ZOFRAN) IV, oxyCODONE, sodium chloride, sodium chloride, sodium chloride, traMADol  Assessment/Plan: Principal Problem:   S/P mitral valve replacement with bioprosthetic valve, tricuspid valve repair, maze procedure and CABG x1 Active Problems:   Atrial fibrillation, unspecified   Chronic systolic heart failure   Essential hypertension   Mitral regurgitation   Severe mitral regurgitation   Chronic systolic congestive heart failure   Pulmonary hypertension   Tricuspid regurgitation   Chronic combined systolic and diastolic CHF (congestive heart failure)   Coronary artery disease   S/P tricuspid valve repair   S/P Maze operation for atrial fibrillation   S/P CABG x 1   S/P placement of cardiac pacemaker 05/02/15, medtronic   Bradycardia   Post-surgical complete heart block, symptomatic  POD #1 for Successful implantation of a Medtronic Adapta L dual-chamber pacemaker for symptomatic complete heart block-- no pneumothorax noted on CXR. Pacing. Interrogation was normal- pending MD review. On tele, has pacing spikes behind QRS.   Pacer site stable.  TSH 6.23   Anticoagulation - coumadin  INR 1.93.    LOS: 8 days   Time spent with pt. : 15 minutes. [facility]  Nurse Practitioner Certified Pager [phone] or after 5pm and on weekends call [phone] 05/03/2015, 11:23 AM

## 2015-05-03 NOTE — Progress Notes (Signed)
[facility]  Patient Status: New  [facility] is providing the following services: PT and OT  If patient discharges after hours, please call [phone].   [name] 05/03/2015, 2:25 PM

## 2015-05-03 NOTE — Consult Note (Signed)
Cardiologist:  Fletcher Anon  Reason for Consult: Bradycardia   [name] is an 80 y.o. female.  HPI:   The patient is a 80 year old female with history of hypertension, COPD, hyperlipidemia, atrial fibrillation, mitral regurgitation, COPD, hypothyroidism, breast cancer, pulmonary hypertension, tricuspid regurgitation,, chronic combined systolic and diastolic heart failure.  On 04/25/2015 the patient underwent mitral valve replacement with a bioprosthetic tissue valve, tricuspid valve repair with an Edwards MC 3 ring angioplasty, maze procedure, coronary artery bypass grafting 1 with a LIMA to the LAD.   Patient currently has a temporary pacemaker after the surgery.  They're unable to wean her off of it.    Today, feeling well wihtout complaint.  Pacemaker placed yesterday without complications.  CXR shows no pneumothorax and well positioned leads.  She does not have any discomfort today and is doing well.  On pacemaker interrogation, she is now in atrial fibrillation.  Past Medical History  Diagnosis Date  . Hypertension   . COPD (chronic obstructive pulmonary disease)   . Hyperlipidemia   . Meniere disease   . HX: breast cancer   . Acute CHF (congestive heart failure)   . Atrial fibrillation   . Mitral regurgitation   . COPD (chronic obstructive pulmonary disease)   . Heart murmur     as child  . Hypothyroidism   . Cancer     hx breast cancer  . Breast cancer 08/09/2001    right/rad  . Pulmonary hypertension   . Tricuspid regurgitation   . Chronic combined systolic and diastolic CHF (congestive heart failure)   . Coronary artery disease   . PONV (postoperative nausea and vomiting)   . Shortness of breath dyspnea     on exertion  . Anxiety   . GERD (gastroesophageal reflux disease)   . Arthritis   . s/p mitral valve replacement with bioprosthetic tissue valve 04/25/2015    27 mm [facility] Mitral bovine bioprosthetic tissue valve  . 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 S/P Maze operation for atrial fibrillation 04/25/2015    Complete bilateral atrial lesion set using cryothermy and bipolar radiofrequency ablation with clipping of LA appendage  . S/P CABG x 1 04/25/2015    LIMA to LAD    Past Surgical History  Procedure Laterality Date  . Cochlear implant Bilateral   . Mastectomy, partial Right   . Cardiac catheterization  11/2013    [facility]  . Cardiac catheterization  10/2014    [facility]  . Breast biopsy Left 11/25/06    neg  . Breast biopsy Left 01/20/12    lt bx /clip-neg  . Eye surgery Bilateral 2014    cataract surgery  . Mitral valve repair N/A 04/25/2015    Procedure: MITRAL VALVE  REPLACEMENT using a 27 mm Edwards Perimount Magna Mitral Ease Valve;  Surgeon: Rexene Alberts, MD;  Location: [place];  Service: Open Heart Surgery;  Laterality: N/A;  . Tricuspid valve replacement N/A 04/25/2015    Procedure: TRICUSPID VALVE REPAIR;  Surgeon: Rexene Alberts, MD;  Location: [place];  Service: Open Heart Surgery;  Laterality: N/A;  . Coronary artery bypass graft N/A 04/25/2015    Procedure: CORONARY ARTERY BYPASS GRAFTING (CABG) x ONE, using left internal mammary artery;  Surgeon: Rexene Alberts, MD;  Location: [place];  Service: Open Heart Surgery;  Laterality: N/A;  . Maze N/A 04/25/2015    Procedure: MAZE;  Surgeon: Rexene Alberts,  MD;  [facility];  Service: Open Heart Surgery;  Laterality: N/A;  . Tee without cardioversion N/A 04/25/2015    Procedure: TRANSESOPHAGEAL ECHOCARDIOGRAM (TEE);  Surgeon: Rexene Alberts, MD;  Location: [place];  Service: Open Heart Surgery;  Laterality: N/A;  . Clipping of atrial appendage N/A 04/25/2015    Procedure: CLIPPING OF ATRIAL APPENDAGE;  Surgeon: Rexene Alberts, MD;  Location: [place];  Service: Open Heart Surgery;  Laterality: N/A;  . Ep implantable device N/A 05/02/2015    Procedure: Pacemaker Implant;  Surgeon: Thompson Grayer, MD;  Location: [place] [facility];  Service:  Cardiovascular;  Laterality: N/A;    Family History  Problem Relation Age of Onset  . Heart disease Father   . Heart disease Brother   . Heart attack Paternal Uncle     Social History:  reports that she quit smoking about 17 years ago. Her smoking use included Cigarettes. She quit after 40 years of use. She does not have any smokeless tobacco history on file. She reports that she drinks alcohol. She reports that she does not use illicit drugs.  Allergies:  Allergies  Allergen Reactions  . Augmentin [Amoxicillin-Pot Clavulanate] Swelling    Swollen joints, pain  . Nifedipine     Other reaction(s): Unknown  . Penicillins     Other reaction(s): Other (See Comments) Swollen joints  . Propranolol     Other reaction(s): Unknown  . Ace Inhibitors     Other reaction(s): Cough  . Diazepam     Other reaction(s): Other (See Comments) Made her "hyper"  . Meperidine     Other reaction(s): Other (See Comments) Made her "hyper"  . Propoxyphene     Other reaction(s): Other (See Comments) Made her "hyper"    Medications: Scheduled Meds: . aspirin EC  81 mg Oral Daily  . bisacodyl  10 mg Oral Daily   Or  . bisacodyl  10 mg Rectal Daily  . docusate sodium  200 mg Oral Daily  . furosemide  40 mg Oral Daily  . levothyroxine  88 mcg Oral QAC breakfast  . losartan  50 mg Oral Daily  . metoCLOPramide (REGLAN) injection  10 mg Intravenous 4 times per day  . pantoprazole  40 mg Oral Daily  . sodium chloride  10-40 mL Intracatheter Q12H  . sodium chloride  3 mL Intravenous Q12H  . sodium chloride  3 mL Intravenous Q12H  . spironolactone  25 mg Oral Daily  . warfarin  2 mg Oral q1800  . Warfarin - Physician Dosing Inpatient   Does not apply q1800   Continuous Infusions: . sodium chloride 20 mL/hr at 05/02/15 2000   PRN Meds:.sodium chloride, sodium chloride, acetaminophen, HYDROcodone-acetaminophen, ipratropium-albuterol, metoprolol, ondansetron (ZOFRAN) IV, oxyCODONE, sodium chloride,  sodium chloride, sodium chloride, traMADol   Results for orders placed or performed during the hospital encounter of 04/25/15 (from the past 48 hour(s))  TSH     Status: Abnormal   Collection Time: 05/01/15  3:00 PM  Result Value Ref Range   TSH 6.238 (H) 0.350 - 4.500 uIU/mL  Protime-INR     Status: Abnormal   Collection Time: 05/02/15  3:57 AM  Result Value Ref Range   Prothrombin Time 19.3 (H) 11.6 - 15.2 seconds   INR 1.62 (H) 0.00 - 1.49  Protime-INR     Status: Abnormal   Collection Time: 05/03/15  5:00 AM  Result Value Ref Range   Prothrombin Time 21.9 (H) 11.6 - 15.2 seconds  INR 1.93 (H) 0.00 - 9.48  Basic metabolic panel     Status: Abnormal   Collection Time: 05/03/15  5:00 AM  Result Value Ref Range   Sodium 138 135 - 145 mmol/L   Potassium 4.1 3.5 - 5.1 mmol/L   Chloride 95 (L) 101 - 111 mmol/L   CO2 33 (H) 22 - 32 mmol/L   Glucose, Bld 104 (H) 65 - 99 mg/dL   BUN 13 6 - 20 mg/dL   Creatinine, Ser 0.75 0.44 - 1.00 mg/dL   Calcium 8.3 (L) 8.9 - 10.3 mg/dL   GFR calc non Af Amer >60 >60 mL/min   GFR calc Af Amer >60 >60 mL/min    Comment: (NOTE) The eGFR has been calculated using the CKD EPI equation. This calculation has not been validated in all clinical situations. eGFR's persistently <60 mL/min signify possible Chronic Kidney Disease.    Anion gap 10 5 - 15  CBC     Status: Abnormal   Collection Time: 05/03/15  5:00 AM  Result Value Ref Range   WBC 10.7 (H) 4.0 - 10.5 K/uL   RBC 3.03 (L) 3.87 - 5.11 MIL/uL   Hemoglobin 9.0 (L) 12.0 - 15.0 g/dL   HCT 28.8 (L) 36.0 - 46.0 %   MCV 95.0 78.0 - 100.0 fL   MCH 29.7 26.0 - 34.0 pg   MCHC 31.3 30.0 - 36.0 g/dL   RDW 16.9 (H) 11.5 - 15.5 %   Platelets 221 150 - 400 K/uL    Dg Chest 2 View  05/03/2015   CLINICAL DATA:  Atelectasis.  EXAM: CHEST  2 VIEW  COMPARISON:  05/01/2015 .  04/30/2015.  FINDINGS: Left PICC line stable position. Mediastinum and hilar structures are stable. Cardiac pacer in stable  position. Prior CABG. Prior mitral and tricuspid valve replacements. Stable cardiomegaly with mild interstitial prominence and bilateral effusions noted consistent mild congestive heart failure. Low lung volumes with basilar atelectasis. Calcified pulmonary nodule right mid lung. No pneumothorax. Apical pleural thickening noted consistent scarring. No acute bony abnormality.  IMPRESSION: 1.  PICC line stable position.  2. Cardiac pacer in stable position. Prior CABG. Prior mitral and tricuspid valve replacements. Persistent severe cardiomegaly. Mild pulmonary venous congestion, bilateral interstitial prominence, and small pleural effusions again noted consistent with mild congestive heart failure. Similar findings noted on prior exam.  3.  Low lung volumes with bibasilar subsegmental atelectasis.   Electronically Signed   By: Marcello Moores  Register   On: 05/03/2015 07:36    ROS  The patient currently denies vomiting, fever, shortness of breath, orthopnea, dizziness, PND, cough, congestion, abdominal pain, hematochezia, melena, lower extremity edema, claudication.   Blood pressure 114/42, pulse 59, temperature 97.8 F (36.6 C), temperature source Oral, resp. rate 14, height _0  (1.575 m), weight 147 lb 0.8 oz (66.7 kg), SpO2 100 %. Physical Exam  Nursing note and vitals reviewed. Constitutional: She is oriented to person, place, and time. She appears well-developed and well-nourished. No distress.  Patient appears comfortable lying in bed.  HENT:  Head: Normocephalic and atraumatic.  Eyes: EOM are normal. Pupils are equal, round, and reactive to light. No scleral icterus.  Neck: Normal range of motion. No JVD present.  Cardiovascular: Normal rate, regular rhythm, S1 normal and S2 normal.   No murmur heard. Pulses:      Radial pulses are 2+ on the right side, and 2+ on the left side.       Dorsalis pedis pulses are 2+ on  the right side, and 2+ on the left side.  Respiratory: Effort normal and breath  sounds normal. She has no wheezes. She has no rales.  GI: Soft. Bowel sounds are normal. She exhibits no distension. There is no tenderness.  Musculoskeletal: She exhibits no edema.  Lymphadenopathy:    She has no cervical adenopathy.  Neurological: She is alert and oriented to person, place, and time. She exhibits normal muscle tone.  Skin: Skin is warm.  Psychiatric: She has a normal mood and affect.   8/12 personal review of ECG: Sinus rhythm, 1 degree AV block, LAD  Personal review of CXR: no pneumothorax, RV and RA leads in place.  Assessment/Plan:  Jucntional Bradycardia: currently paced with new dual chamber pacemaker.  Check this AM shows good R waves and thresholds.  CXR shows well placed leads.  No further changes necessary.  Atrial Fibrillation:  Is on coumadin and [name] continue for documented atrial fibrillation with goal INR 2-3.  Found to be in AF on interrogation.  [name] start amiodarone 200 mg BID.  Should be on this for one week then 200 mg per day.  [name] likely take her off when she returns to clinic.   [name] 05/03/2015 07:25

## 2015-05-03 NOTE — Significant Event (Signed)
Patient ambulated in unit then taken in wheelchair to new her room. VS stable prior and during the transfer. All personal belongings (clothes, cards) taken to her new room. Patient settled in chair per her requests. Receiving RN Parke Simmers in room. Report given to her. Family made aware of the transfer. Lakyn Alsteen, Therapist, sports.

## 2015-05-03 NOTE — Progress Notes (Addendum)
Occupational Therapy Treatment Patient Details Name: [name] MRN: [id number] DOB: [birth year] Today's Date: 05/03/2015    History of present illness Admitted 04/25/15 for MVR, tricuspid valve repair, CABG x 1, and clipping of atrial appendage. Post-op hypotension requiring pressors and N/V PMHx- COPD (home O2), CHF, [place], HTN, Rt breast Ca; bil cochlear implants. Pt is now s/p PPM implant 05/02/15.   OT comments  Pt progressing. Education provided in session. Will continue to follow acutely.  Follow Up Recommendations  Home health OT;Supervision/Assistance - 24 hour    Equipment Recommendations  3 in 1 bedside comode    Recommendations for Other Services      Precautions / Restrictions Precautions Precautions: Sternal;Fall;ICD/Pacemaker Precaution Comments: educated on sternal and pacemaker precautions Restrictions Weight Bearing Restrictions: Yes (sternal precautions)       Mobility Bed Mobility               General bed mobility comments: not assessed  Transfers Overall transfer level: Needs assistance Equipment used: Rolling walker (2 wheeled) Transfers: Sit to/from Stand Sit to Stand: Min assist;Min guard         General transfer comment: assist to boost from recliner chair. Min guard from 3 in 1. Cues for precautions    Balance    Min guard for ambulation with RW.                               ADL Overall ADL's : Needs assistance/impaired     Grooming: Wash/dry face;Brushing hair;Standing;Set up;Supervision/safety (applied chapstick)        Lower Body Bathing: Standing at sink; Min guard (pt washed peri area and bottom)       Lower Body Dressing: Moderate assistance;Sitting/lateral leans (donned/doffed sock; OT donned other one)   Toilet Transfer: Min guard;Ambulation;Minimal assistance;RW;BSC (Min A-sit to stand from recliner chair;Min guard from 3 in 1)   Toileting- Clothing Manipulation and Hygiene: Minimal assistance;Sit  to/from stand (assist with managing pad' pt able to pull underwear up and down)       Functional mobility during ADLs: Min guard;Rolling walker  Comments: Cues for precautions in session. Recommended pt to limit use of left arm.        Vision                     Perception     Praxis      Cognition  Awake/Alert Behavior During Therapy: WFL for tasks assessed/performed Overall Cognitive Status: Within Functional Limits for tasks assessed                       Extremity/Trunk Assessment               Exercises     Shoulder Instructions       General Comments      Pertinent Vitals/ Pain       Pain Assessment: No/denies pain (reported tightness on left side of chest); HR stable.  Home Living                                          Prior Functioning/Environment              Frequency Min 2X/week     Progress Toward Goals  OT Goals(current goals can now be found in the  care plan section)  Progress towards OT goals: Progressing toward goals  Acute Rehab OT Goals Patient Stated Goal: not stated OT Goal Formulation: With patient Time For Goal Achievement: 05/12/15 Potential to Achieve Goals: Good ADL Goals Pt Will Perform Grooming: with supervision;standing Pt Will Perform Upper Body Bathing: sitting;with supervision Pt Will Perform Lower Body Bathing: with min assist;sit to/from stand Pt Will Perform Upper Body Dressing: with supervision;sitting Pt Will Perform Lower Body Dressing: with min assist;sit to/from stand Pt Will Transfer to Toilet: with supervision;ambulating;bedside commode Pt Will Perform Toileting - Clothing Manipulation and hygiene: with supervision;sit to/from stand Pt Will Perform Tub/Shower Transfer: Shower transfer;with min guard assist;ambulating;3 in 1;rolling walker;grab bars Additional ADL Goal #1: Pt will generalize sternal precautions in mobility and ADL. Additional ADL Goal #2: Pt will  employ energy conservation strategies in ADL and mobility with min verbal cues.  Plan Discharge plan remains appropriate    Co-evaluation                 End of Session Equipment Utilized During Treatment: Gait belt;Oxygen;Rolling walker   Activity Tolerance Patient tolerated treatment well   Patient Left in chair;with call bell/phone within reach   Nurse Communication Mobility status; notified her that she left computer logged in        Time: 6811-5726 OT Time Calculation (min): 21 min  Charges: OT General Charges $OT Visit: 1 Procedure OT Treatments $Self Care/Home Management : 8-22 mins  Benito Mccreedy OTR/L [phone] 05/03/2015, 5:07 PM

## 2015-05-03 NOTE — Progress Notes (Signed)
[address]       [place] [address]             [phone]        CARDIOTHORACIC SURGERY PROGRESS NOTE  R8 Days Post-Op Procedure(s) (LRB): MITRAL VALVE REPLACEMENT using a 27 mm Edwards Perimount Magna Mitral Ease Valve (N/A) TRICUSPID VALVE REPAIR (N/A) CORONARY ARTERY BYPASS GRAFTING (CABG) x ONE, using left internal mammary artery (N/A) MAZE (N/A) TRANSESOPHAGEAL ECHOCARDIOGRAM (TEE) (N/A) CLIPPING OF ATRIAL APPENDAGE (N/A)   R1 Day Post-Op Procedure(s) (LRB): Pacemaker Implant (N/A)  Subjective: Feels better.  Still very weak.  Objective: Vital signs: BP Readings from Last 1 Encounters:  05/03/15 126/53   Pulse Readings from Last 1 Encounters:  05/03/15 59   Resp Readings from Last 1 Encounters:  05/03/15 12   Temp Readings from Last 1 Encounters:  05/03/15 97.8 F (36.6 C) Oral    Hemodynamics:    Physical Exam:  Rhythm:   V-paced  Breath sounds: Few bibasilar crackles R>L  Heart sounds:  RRR  Incisions:  Clean and dry.  Scant erythema inferior sternal incision  Abdomen:  Soft, non-distended, non-tender  Extremities:  Warm, well-perfused    Intake/Output from previous day: 08/23 0701 - 08/24 0700 In: 600 [I.V.:400; IV Piggyback:200] Out: 200 [Urine:200] Intake/Output this shift: Total I/O In: -  Out: 250 [Urine:250]  Lab Results:  CBC: Recent Labs  05/01/15 0411 05/03/15 0500  WBC 11.9* 10.7*  HGB 10.1* 9.0*  HCT 31.9* 28.8*  PLT 203 221    BMET:  Recent Labs  05/01/15 0411 05/03/15 0500  NA 138 138  K 3.9 4.1  CL 93* 95*  CO2 37* 33*  GLUCOSE 130* 104*  BUN 13 13  CREATININE 0.56 0.75  CALCIUM 8.4* 8.3*     PT/INR:   Recent Labs  05/03/15 0500  LABPROT 21.9*  INR 1.93*    CBG (last 3)  No results for input(s): GLUCAP in the last 72 hours.  ABG    Component Value Date/Time   PHART 7.391 04/26/2015 0952   PCO2ART 42.7 04/26/2015 0952   PO2ART 55.0* 04/26/2015 0952   HCO3 25.9*  04/26/2015 0952   TCO2 34 04/30/2015 1735   ACIDBASEDEF 3.0* 04/26/2015 0832   O2SAT 61.7 04/29/2015 0710    CXR: CHEST 2 VIEW  COMPARISON: 05/01/2015 . 04/30/2015.  FINDINGS: Left PICC line stable position. Mediastinum and hilar structures are stable. Cardiac pacer in stable position. Prior CABG. Prior mitral and tricuspid valve replacements. Stable cardiomegaly with mild interstitial prominence and bilateral effusions noted consistent mild congestive heart failure. Low lung volumes with basilar atelectasis. Calcified pulmonary nodule right mid lung. No pneumothorax. Apical pleural thickening noted consistent scarring. No acute bony abnormality.  IMPRESSION: 1. PICC line stable position.  2. Cardiac pacer in stable position. Prior CABG. Prior mitral and tricuspid valve replacements. Persistent severe cardiomegaly. Mild pulmonary venous congestion, bilateral interstitial prominence, and small pleural effusions again noted consistent with mild congestive heart failure. Similar findings noted on prior exam.  3. Low lung volumes with bibasilar subsegmental atelectasis.   Electronically Signed  By: Marcello Moores Register  On: 05/03/2015 07:36  Assessment/Plan:  Overall stable w/ V-paced rhythm now s/p perm pacer implant Generalized weakness and physical deconditioning Expected post op acute blood loss anemia, stable Expected post op volume excess, very mild, weight now reportedly below pre-op baseline Expected post op atelectasis, mild   Mobilize  I favor setting pacer backup rate to 70 beats/min  for now  Restart amiodarone  Transfer step down  Continue coumadin 2 mg/day  [name] 05/03/2015 9:08 AM

## 2015-05-03 NOTE — Progress Notes (Signed)
Pt transferred to 2W22 at 1430. She was not in any acute distress. She is alert and oriented x 4. O2 on via [place] at 2L/min. O2 saturation in the low to mid 90s. Noted with slight shortness of breath at transfer. Pt was oriented to room, unit routines and use of call light. She verbalized understanding. Explained purpose of keeping bed alarm on.

## 2015-05-03 NOTE — Discharge Instructions (Addendum)
Supplemental Discharge Instructions for  Pacemaker/Defibrillator Patients  Activity No heavy lifting or vigorous activity with your left/right arm for 6 to 8 weeks.  Do not raise your left/right arm above your head for one week.  Gradually raise your affected arm as drawn below.           __         05/07/15                 05/08/15                    05/09/15                   05/10/15   WOUND CARE - Keep the wound area clean and dry.  Do not get this area wet for one week. No showers for one week; you may shower on  05/10/15   . - The tape/steri-strips on your wound will fall off; do not pull them off.  No bandage is needed on the site.  DO  NOT apply any creams, oils, or ointments to the wound area. - If you notice any drainage or discharge from the wound, any swelling or bruising at the site, or you develop a fever > 101? F after you are discharged home, call the office at once.  Special Instructions - You are still able to use cellular telephones; use the ear opposite the side where you have your pacemaker/defibrillator.  Avoid carrying your cellular phone near your device. - When traveling through airports, show security personnel your identification card to avoid being screened in the metal detectors.  Ask the security personnel to use the hand wand. - Avoid arc welding equipment, MRI testing (magnetic resonance imaging), TENS units (transcutaneous nerve stimulators).  Call the office for questions about other devices. - Avoid electrical appliances that are in poor condition or are not properly grounded. - Microwave ovens are safe to be near or to operate. Information on my medicine - Coumadin   (Warfarin)  This medication education was reviewed with me or my healthcare representative as part of my discharge preparation.  The pharmacist that spoke with me during my hospital stay was:  Romona Curls, [facility]  Why was Coumadin prescribed for you? Coumadin was prescribed for you because  you have a blood clot or a medical condition that can cause an increased risk of forming blood clots. Blood clots can cause serious health problems by blocking the flow of blood to the heart, lung, or brain. Coumadin can prevent harmful blood clots from forming. As a reminder your indication for Coumadin is:   Select from menu  What test will check on my response to Coumadin? While on Coumadin (warfarin) you will need to have an INR test regularly to ensure that your dose is keeping you in the desired range. The INR (international normalized ratio) number is calculated from the result of the laboratory test called prothrombin time (PT).  If an INR APPOINTMENT HAS NOT ALREADY BEEN MADE FOR YOU please schedule an appointment to have this lab work done by your health care provider within 7 days. Your INR goal is usually a number between:  2 to 3 or your provider may give you a more narrow range like 2-2.5.  Ask your health care provider during an office visit what your goal INR is.  What  do you need to  know  About  COUMADIN? Take Coumadin (warfarin) exactly as  prescribed by your healthcare provider about the same time each day.  DO NOT stop taking without talking to the doctor who prescribed the medication.  Stopping without other blood clot prevention medication to take the place of Coumadin may increase your risk of developing a new clot or stroke.  Get refills before you run out.  What do you do if you miss a dose? If you miss a dose, take it as soon as you remember on the same day then continue your regularly scheduled regimen the next day.  Do not take two doses of Coumadin at the same time.  Important Safety Information A possible side effect of Coumadin (Warfarin) is an increased risk of bleeding. You should call your healthcare provider right away if you experience any of the following: ? Bleeding from an injury or your nose that does not stop. ? Unusual colored urine (red or dark brown) or  unusual colored stools (red or black). ? Unusual bruising for unknown reasons. ? A serious fall or if you hit your head (even if there is no bleeding).  Some foods or medicines interact with Coumadin (warfarin) and might alter your response to warfarin. To help avoid this: ? Eat a balanced diet, maintaining a consistent amount of Vitamin K. ? Notify your provider about major diet changes you plan to make. ? Avoid alcohol or limit your intake to 1 drink for women and 2 drinks for men per day. (1 drink is 5 oz. wine, 12 oz. beer, or 1.5 oz. liquor.)  Make sure that ANY health care provider who prescribes medication for you knows that you are taking Coumadin (warfarin).  Also make sure the healthcare provider who is monitoring your Coumadin knows when you have started a new medication including herbals and non-prescription products.  Coumadin (Warfarin)  Major Drug Interactions  Increased Warfarin Effect Decreased Warfarin Effect  Alcohol (large quantities) Antibiotics (esp. Septra/Bactrim, Flagyl, Cipro) Amiodarone (Cordarone) Aspirin (ASA) Cimetidine (Tagamet) Megestrol (Megace) NSAIDs (ibuprofen, naproxen, etc.) Piroxicam (Feldene) Propafenone (Rythmol SR) Propranolol (Inderal) Isoniazid (INH) Posaconazole (Noxafil) Barbiturates (Phenobarbital) Carbamazepine (Tegretol) Chlordiazepoxide (Librium) Cholestyramine (Questran) Griseofulvin Oral Contraceptives Rifampin Sucralfate (Carafate) Vitamin K   Coumadin (Warfarin) Major Herbal Interactions  Increased Warfarin Effect Decreased Warfarin Effect  Garlic Ginseng Ginkgo biloba Coenzyme Q10 Green tea St. Johns wort    Coumadin (Warfarin) FOOD Interactions  Eat a consistent number of servings per week of foods HIGH in Vitamin K (1 serving =  cup)  Collards (cooked, or boiled & drained) Kale (cooked, or boiled & drained) Mustard greens (cooked, or boiled & drained) Parsley *serving size only =  cup Spinach (cooked, or  boiled & drained) Swiss chard (cooked, or boiled & drained) Turnip greens (cooked, or boiled & drained)  Eat a consistent number of servings per week of foods MEDIUM-HIGH in Vitamin K (1 serving = 1 cup)  Asparagus (cooked, or boiled & drained) Broccoli (cooked, boiled & drained, or raw & chopped) Brussel sprouts (cooked, or boiled & drained) *serving size only =  cup Lettuce, raw (green leaf, endive, romaine) Spinach, raw Turnip greens, raw & chopped   These websites have more information on Coumadin (warfarin):  FailFactory.se; VeganReport.com.au;  SNF Discharge Instructions: 1. Please obtain vital signs at least one time daily 2.Please weigh the patient daily. If he or she continues to gain weight or develops lower extremity edema, contact the office at [phone]. 3. Ambulate patient at least three times daily and please use sternal precautions. 4. Please monitor INR,  keep level between 2.0-2.5, results should be faxed to [place]: Coumadin clinic at [phone]. Please change wound vac on Saturday 05/13/2015, then resume changes every MWF

## 2015-05-04 LAB — PROTIME-INR
INR: 2.16 — ABNORMAL HIGH (ref 0.00–1.49)
PROTHROMBIN TIME: 23.9 s — AB (ref 11.6–15.2)

## 2015-05-04 MED ORDER — POTASSIUM CHLORIDE CRYS ER 20 MEQ PO TBCR
20.0000 meq | EXTENDED_RELEASE_TABLET | Freq: Every day | ORAL | Status: DC
  Administered 2015-05-05: 20 meq via ORAL
  Filled 2015-05-04 (×2): qty 1

## 2015-05-04 MED ORDER — FUROSEMIDE 40 MG PO TABS
40.0000 mg | ORAL_TABLET | Freq: Two times a day (BID) | ORAL | Status: DC
  Administered 2015-05-05 – 2015-05-06 (×3): 40 mg via ORAL
  Filled 2015-05-04 (×5): qty 1

## 2015-05-04 MED ORDER — VANCOMYCIN HCL 10 G IV SOLR
1250.0000 mg | INTRAVENOUS | Status: DC
  Administered 2015-05-04 – 2015-05-06 (×3): 1250 mg via INTRAVENOUS
  Filled 2015-05-04 (×3): qty 1250

## 2015-05-04 MED ORDER — METOCLOPRAMIDE HCL 10 MG PO TABS
10.0000 mg | ORAL_TABLET | Freq: Three times a day (TID) | ORAL | Status: DC
  Administered 2015-05-04 – 2015-05-11 (×27): 10 mg via ORAL
  Filled 2015-05-04 (×3): qty 1
  Filled 2015-05-04 (×2): qty 2
  Filled 2015-05-04 (×2): qty 1
  Filled 2015-05-04: qty 2
  Filled 2015-05-04 (×5): qty 1
  Filled 2015-05-04: qty 2
  Filled 2015-05-04 (×13): qty 1
  Filled 2015-05-04: qty 2
  Filled 2015-05-04: qty 1
  Filled 2015-05-04: qty 2
  Filled 2015-05-04 (×9): qty 1

## 2015-05-04 MED ORDER — VANCOMYCIN HCL IN DEXTROSE 1-5 GM/200ML-% IV SOLN
1000.0000 mg | Freq: Once | INTRAVENOUS | Status: DC
  Filled 2015-05-04: qty 200

## 2015-05-04 MED ORDER — FUROSEMIDE 10 MG/ML IJ SOLN
40.0000 mg | Freq: Once | INTRAMUSCULAR | Status: AC
  Administered 2015-05-04: 40 mg via INTRAVENOUS
  Filled 2015-05-04: qty 4

## 2015-05-04 NOTE — Progress Notes (Addendum)
[address]       [place] [address]             [phone]      2 Days Post-Op Procedure(s) (LRB): Pacemaker Implant (N/A) Subjective: Feels pretty well, c/o not sleeping well, + orthopnea  Objective: Vital signs in last 24 hours: Temp:  [97.7 F (36.5 C)-98.5 F (36.9 C)] 97.7 F (36.5 C) (08/25 0415) Pulse Rate:  [59-78] 71 (08/25 0415) Cardiac Rhythm:  [-] Ventricular paced (08/24 2028) Resp:  [12-23] 19 (08/25 0415) BP: (126-164)/(50-65) 136/62 mmHg (08/25 0415) SpO2:  [95 %-100 %] 100 % (08/25 0415) Weight:  [148 lb 1.6 oz (67.178 kg)] 148 lb 1.6 oz (67.178 kg) (08/25 0415)  Hemodynamic parameters for last 24 hours:    Intake/Output from previous day: 08/24 0701 - 08/25 0700 In: 490 [P.O.:440; I.V.:50] Out: 600 [Urine:600] Intake/Output this shift:    General appearance: alert, cooperative and no distress Heart: regular rate and rhythm Lungs: dim in bases Abdomen: soft, nontender Extremities: minimal edema Wound: chest incis with some purulent drainage from lower pole- no erethema, sernum dtable without click  Lab Results:  Recent Labs  05/03/15 0500  WBC 10.7*  HGB 9.0*  HCT 28.8*  PLT 221   BMET:  Recent Labs  05/03/15 0500  NA 138  K 4.1  CL 95*  CO2 33*  GLUCOSE 104*  BUN 13  CREATININE 0.75  CALCIUM 8.3*    PT/INR:  Recent Labs  05/04/15 0550  LABPROT 23.9*  INR 2.16*   ABG    Component Value Date/Time   PHART 7.391 04/26/2015 0952   HCO3 25.9* 04/26/2015 0952   TCO2 34 04/30/2015 1735   ACIDBASEDEF 3.0* 04/26/2015 0832   O2SAT 61.7 04/29/2015 0710   CBG (last 3)  No results for input(s): GLUCAP in the last 72 hours.  Meds Scheduled Meds: . amiodarone  200 mg Oral BID  . aspirin EC  81 mg Oral Daily  . bisacodyl  10 mg Oral Daily   Or  . bisacodyl  10 mg Rectal Daily  . docusate sodium  200 mg Oral Daily  . furosemide  40 mg Oral Daily  . levothyroxine  88 mcg Oral QAC breakfast  .  losartan  50 mg Oral Daily  . metoCLOPramide (REGLAN) injection  10 mg Intravenous 4 times per day  . pantoprazole  40 mg Oral Daily  . sodium chloride  10-40 mL Intracatheter Q12H  . sodium chloride  3 mL Intravenous Q12H  . sodium chloride  3 mL Intravenous Q12H  . spironolactone  25 mg Oral Daily  . warfarin  2 mg Oral q1800  . Warfarin - Physician Dosing Inpatient   Does not apply q1800   Continuous Infusions: . sodium chloride Stopped (05/03/15 0700)   PRN Meds:.sodium chloride, sodium chloride, acetaminophen, HYDROcodone-acetaminophen, ipratropium-albuterol, metoprolol, ondansetron (ZOFRAN) IV, oxyCODONE, sodium chloride, sodium chloride, sodium chloride, traMADol  Xrays Dg Chest 2 View  05/03/2015   CLINICAL DATA:  Atelectasis.  EXAM: CHEST  2 VIEW  COMPARISON:  05/01/2015 .  04/30/2015.  FINDINGS: Left PICC line stable position. Mediastinum and hilar structures are stable. Cardiac pacer in stable position. Prior CABG. Prior mitral and tricuspid valve replacements. Stable cardiomegaly with mild interstitial prominence and bilateral effusions noted consistent mild congestive heart failure. Low lung volumes with basilar atelectasis. Calcified pulmonary nodule right mid lung. No pneumothorax. Apical pleural thickening noted consistent scarring. No acute bony abnormality.  IMPRESSION:  1.  PICC line stable position.  2. Cardiac pacer in stable position. Prior CABG. Prior mitral and tricuspid valve replacements. Persistent severe cardiomegaly. Mild pulmonary venous congestion, bilateral interstitial prominence, and small pleural effusions again noted consistent with mild congestive heart failure. Similar findings noted on prior exam.  3.  Low lung volumes with bibasilar subsegmental atelectasis.   Electronically Signed   By: Marcello Moores  Register   On: 05/03/2015 07:36    Assessment/Plan: S/P Procedure(s) (LRB): Pacemaker Implant (N/A)  1 v paced rhythm- monitor 2 conts coumadin  3 conts  current diuretics for mild volume overload 4 culture sternal drainage -remains afebrile, recheck WBC tomorrow, will d/w with MD    LOS: 9 days    GOLD,WAYNE E 05/04/2015  I have seen and examined the patient and agree with the assessment and plan as outlined.  Start vancomycin and local wound care for small area of superficial cellulitis w/ purulent drainage inferior margin of sternal wound.  Rexene Alberts 05/04/2015

## 2015-05-04 NOTE — Progress Notes (Signed)
Physical Therapy Treatment Note    05/03/15 1113  PT Visit Information  Last PT Received On 05/03/15  Assistance Needed +1  History of Present Illness Adm 04/25/15 for MVR, tricuspid valve repair, CABG x 1, and clipping of atrial appendage. Post-op hypotension requiring pressors and N/V PMHx- COPD (home O2), CHF, Menieres, HTN, Rt breast Ca; bil cochlear implants. Pt is now s/p PPM implant 05/02/15.  PT Time Calculation  PT Start Time (ACUTE ONLY) 1021  PT Stop Time (ACUTE ONLY) 1058  PT Time Calculation (min) (ACUTE ONLY) 37 min  Subjective Data  Patient Stated Goal be able to go home with spouse assist  Precautions  Precautions Sternal;Fall;ICD/Pacemaker  Precaution Comments Pt is able to independently state sternal precautions.  requires one verbal cue to adhere to precautions   Restrictions  Weight Bearing Restrictions Yes (Sternal)  Pain Assessment  Pain Assessment No/denies pain  Cognition  Arousal/Alertness Awake/alert  Behavior During Therapy WFL for tasks assessed/performed  Overall Cognitive Status Within Functional Limits for tasks assessed  Bed Mobility  Overal bed mobility Needs Assistance  Bed Mobility Supine to Sit;Sit to Supine  Supine to sit Min guard  General bed mobility comments Hands-on support for trunk elevation to full sitting position. Pt was reminded for maintaining sternal precautions.   Transfers  Overall transfer level Needs assistance  Equipment used Rolling walker (2 wheeled)  Transfers Sit to/from Stand  Sit to Stand Min guard  General transfer comment Hands-on steadying assist. Pt was cued to maintain sternal precautions.   Ambulation/Gait  Ambulation/Gait assistance Min guard  Ambulation Distance (Feet) 120 Feet  Assistive device Rolling walker (2 wheeled)  Gait Pattern/deviations Step-through pattern;Decreased stride length;Trunk flexed  General Gait Details x3 standing rest breaks. Pt was able to ambulate well with RW. VC's for pacemaker  precautions and RW use.  Gait velocity decreased  Gait velocity interpretation Below normal speed for age/gender  Balance  Overall balance assessment Needs assistance  Sitting-balance support Feet supported;No upper extremity supported  Sitting balance-Leahy Scale Good  Standing balance support Bilateral upper extremity supported;During functional activity  Standing balance-Leahy Scale Poor  Standing balance comment Requires UE support and/or assist for dynamic standing balance.   PT - End of Session  Equipment Utilized During Treatment Gait belt;Oxygen  Activity Tolerance Patient tolerated treatment well  Patient left in chair;with call bell/phone within reach  Nurse Communication Mobility status  PT - Assessment/Plan  PT Plan Current plan remains appropriate  PT Frequency (ACUTE ONLY) Min 3X/week  Follow Up Recommendations Home health PT;Supervision for mobility/OOB  PT equipment Rolling walker with 5" wheels  PT Goal Progression  Progress towards PT goals Progressing toward goals  Acute Rehab PT Goals  PT Goal Formulation With patient  Time For Goal Achievement 05/05/15  Potential to Achieve Goals Good  PT General Charges  $$ ACUTE PT VISIT 1 Procedure  PT Treatments  $Gait Training 8-22 mins  $Therapeutic Activity 8-22 mins   Assessment: Pt progressing well towards physical therapy goals. Was able to perform transfers and ambulation with occasional assist for balance/support. Was able to progress ambulation distance with rest breaks due to fatigue. Anticipate steady progress with therapy. Will continue to follow.   Rolinda Roan, PT, DPT Acute Rehabilitation Services Pager: [phone]  Note: This is a late entry due to Epic/CHL being down.

## 2015-05-04 NOTE — Progress Notes (Signed)
CARDIAC REHAB PHASE I   PRE:  Rate/Rhythm: 73 pacing    BP: sitting 152/72    SaO2: 97 2L  MODE:  Ambulation: 150 ft   POST:  Rate/Rhythm: 72 pacing    BP: sitting 137/62     SaO2: 93 2L  Pt generally weak and DOE with activity. Walked to BR with gait belt assist and cleaned herself before walk. Used rollator after BR but pt had to sit to rest after a few feet due to fatigue/SOB. Pt turned and only sat on edge of rollator (too close to edge). Got assistance for rest of walk due to pts weakness. Sat x1 more for 150 ft total, using rollator, assist x2, gait belt and 2L O2. Pt exhausted after walk. VSS, SaO2 good.  [phone]   Josephina Shih [place] CES, ACSM 05/04/2015 11:59 AM

## 2015-05-04 NOTE — Progress Notes (Addendum)
ANTIBIOTIC CONSULT NOTE - INITIAL  Pharmacy Consult for Vancomycin Indication: sternal wound  Assessment: 80 year old female s/p open heart surgery 8/16 with sternal wound drainage.  Pharmacy asked to begin Vancomycin as empiric therapy  Goal of Therapy:  Vancomycin trough level 10-15 mcg/ml  Plan:  Vancomycin 1250 mg iv Q 24 hours Follow up Scr, cultures, progress, fever trend  Does Reglan need to continue?  Consider a 5 mg dose if it does? Have changed to po.        Allergies  Allergen Reactions  . Augmentin [Amoxicillin-Pot Clavulanate] Swelling    Swollen joints, pain  . Nifedipine     Other reaction(s): Unknown  . Penicillins     Other reaction(s): Other (See Comments) Swollen joints  . Propranolol     Other reaction(s): Unknown  . Ace Inhibitors     Other reaction(s): Cough  . Diazepam     Other reaction(s): Other (See Comments) Made her "hyper"  . Meperidine     Other reaction(s): Other (See Comments) Made her "hyper"  . Propoxyphene     Other reaction(s): Other (See Comments) Made her "hyper"    Patient Measurements: Height: 5\' 2"  (157.5 cm) Weight: 148 lb 1.6 oz (67.178 kg) IBW/kg (Calculated) : 50.1  Vital Signs: Temp: 97.7 F (36.5 C) (08/25 0415) Temp Source: Oral (08/25 0415) BP: 136/62 mmHg (08/25 0415) Pulse Rate: 71 (08/25 0415) Intake/Output from previous day: 08/24 0701 - 08/25 0700 In: 490 [P.O.:440; I.V.:50] Out: 600 [Urine:600] Intake/Output from this shift: Total I/O In: 240 [P.O.:240] Out: 100 [Urine:100]  Labs:  Recent Labs  05/03/15 0500  WBC 10.7*  HGB 9.0*  PLT 221  CREATININE 0.75   Estimated Creatinine Clearance: 52.1 mL/min (by C-G formula based on Cr of 0.75).  Microbiology: Recent Results (from the past 720 hour(s))  Surgical pcr screen     Status: None   Collection Time: 04/21/15 12:43 PM  Result Value Ref Range Status   MRSA, PCR NEGATIVE NEGATIVE Final   Staphylococcus aureus NEGATIVE NEGATIVE  Final    Comment:        The Xpert SA Assay (FDA approved for NASAL specimens in patients over 42 years of age), is one component of a comprehensive surveillance program.  Test performance has been validated by [facility] for patients greater than or equal to 69 year old. It is not intended to diagnose infection nor to guide or monitor treatment.      Thank you Anette Guarneri, PharmD [phone]  05/04/2015,10:10 AM

## 2015-05-04 NOTE — Progress Notes (Signed)
Pt continues with generalized weakness. She ambulated 1 lap around the circle at the Nurse's station with a rolling walker. She was noted to be short of breath and she made 3 stops to rest as she got very tired. She has complained of feeling fatigue all shift.. She has been up in the chair all shift. Ambulation encouraged in the bedroom with assistance to use bed side commode.  Mid sternal incision noted with serosanguinous drainage. Wound cultured and sample sent to the lab. Cleansed with hydrogen peroxide and dry dressing applied. Pt started on vancomycin. She has been afebrile.

## 2015-05-05 LAB — PROTIME-INR
INR: 2.17 — AB (ref 0.00–1.49)
Prothrombin Time: 24 seconds — ABNORMAL HIGH (ref 11.6–15.2)

## 2015-05-05 NOTE — Progress Notes (Signed)
Physical Therapy Treatment Patient Details Name: [name] MRN: [id number] DOB: May 16, 1935 Today's Date: 05/05/2015    History of Present Illness Admitted 04/25/15 for MVR, tricuspid valve repair, CABG x 1, and clipping of atrial appendage. Post-op hypotension requiring pressors and N/V PMHx- COPD (home O2), CHF, Menie[place], HTN, Rt breast Ca; bil cochlear implants. Pt is now s/p PPM implant 05/02/15.    PT Comments    Pt progressing towards physical therapy goals. Pt reports that she feels discouraged at her slow progress, however was willing to participate with therapy. She was able to improve ambulation distance this session and although took frequent standing rest breaks, pt was able to self-monitor fatigue level. Pt will need to practice steps prior to d/c. Will continue to follow and progress as able per POC.   Follow Up Recommendations  Home health PT;Supervision for mobility/OOB     Equipment Recommendations  Rolling walker with 5" wheels    Recommendations for Other Services       Precautions / Restrictions Precautions Precautions: Sternal;Fall;ICD/Pacemaker Precaution Comments: educated on sternal and pacemaker precautions Restrictions Weight Bearing Restrictions: Yes    Mobility  Bed Mobility               General bed mobility comments: Pt received sitting up in recliner  Transfers Overall transfer level: Needs assistance Equipment used: Rolling walker (2 wheeled) Transfers: Sit to/from Stand Sit to Stand: Min assist         General transfer comment: Pt practiced sit<>stand x2. Pt required cueing to maintain sternal precautions, including holding the pillow to keep herself from using UE's to scoot forward in chair and push up to stand. Steadying assist provided for decreased balance upon standing.   Ambulation/Gait Ambulation/Gait assistance: Min guard Ambulation Distance (Feet): 150 Feet Assistive device: Rolling walker (2 wheeled) Gait  Pattern/deviations: Step-through pattern;Decreased stride length;Trunk flexed Gait velocity: decreased Gait velocity interpretation: Below normal speed for age/gender General Gait Details: x5 standing rest breaks due to fatigue. Pt with good RW use.   Stairs            Wheelchair Mobility    Modified Rankin (Stroke Patients Only)       Balance Overall balance assessment: Needs assistance Sitting-balance support: Feet supported;No upper extremity supported Sitting balance-Leahy Scale: Good     Standing balance support: No upper extremity supported;During functional activity Standing balance-Leahy Scale: Poor Standing balance comment: assist provided for initial imbalance at edge of chair upon standing.                     Cognition Arousal/Alertness: Awake/alert Behavior During Therapy: WFL for tasks assessed/performed Overall Cognitive Status: Within Functional Limits for tasks assessed                      Exercises      General Comments        Pertinent Vitals/Pain Pain Assessment: No/denies pain    Home Living                      Prior Function            PT Goals (current goals can now be found in the care plan section) Acute Rehab PT Goals Patient Stated Goal: not stated PT Goal Formulation: With patient Time For Goal Achievement: 05/05/15 Potential to Achieve Goals: Good Progress towards PT goals: Progressing toward goals    Frequency  Min 3X/week    PT  Plan Current plan remains appropriate    Co-evaluation             End of Session Equipment Utilized During Treatment: Gait belt;Oxygen Activity Tolerance: Patient limited by fatigue Patient left: in chair;with call bell/phone within reach     Time: 0809-0830 PT Time Calculation (min) (ACUTE ONLY): 21 min  Charges:  $Gait Training: 8-22 mins                    G Codes:      Rolinda Roan 2015/05/28, 9:12 AM   Rolinda Roan, PT, DPT Acute  Rehabilitation Services Pager: [phone]

## 2015-05-05 NOTE — Progress Notes (Addendum)
      [address]       ,[place] [address]             [phone]      3 Days Post-Op Procedure(s) (LRB): Pacemaker Implant (N/A) Subjective: Feels weak, but no specific complaints.  Objective: Vital signs in last 24 hours: Temp:  [97.8 F (36.6 C)-98.3 F (36.8 C)] 98.1 F (36.7 C) (08/26 0400) Pulse Rate:  [57-71] 57 (08/26 0400) Cardiac Rhythm:  [-] Ventricular paced (08/25 1920) Resp:  [17-20] 20 (08/26 0400) BP: (149-163)/(63-85) 151/80 mmHg (08/26 0400) SpO2:  [97 %-99 %] 99 % (08/26 0400) Weight:  [147 lb 3.2 oz (66.769 kg)] 147 lb 3.2 oz (66.769 kg) (08/26 0400)  Hemodynamic parameters for last 24 hours:    Intake/Output from previous day: 08/25 0701 - 08/26 0700 In: 640 [P.O.:600; I.V.:40] Out: 1850 [Urine:1850] Intake/Output this shift:     Physical Exam  Cardiovascular: Normal rate and regular rhythm.   Soft syst murmur  Pulmonary/Chest:  Mildly dim in bases   Abdominal: Soft. There is no tenderness.  Musculoskeletal: She exhibits edema.  Skin:  Sternal incis with purulent drainage lower pole, no erethema, no click         Lab Results:  Recent Labs  05/03/15 0500  WBC 10.7*  HGB 9.0*  HCT 28.8*  PLT 221   BMET:  Recent Labs  05/03/15 0500  NA 138  K 4.1  CL 95*  CO2 33*  GLUCOSE 104*  BUN 13  CREATININE 0.75  CALCIUM 8.3*    PT/INR:  Recent Labs  05/05/15 0400  LABPROT 24.0*  INR 2.17*   ABG    Component Value Date/Time   PHART 7.391 04/26/2015 0952   HCO3 25.9* 04/26/2015 0952   TCO2 34 04/30/2015 1735   ACIDBASEDEF 3.0* 04/26/2015 0832   O2SAT 61.7 04/29/2015 0710   CBG (last 3)  No results for input(s): GLUCAP in the last 72 hours.  Meds Scheduled Meds: . amiodarone  200 mg Oral BID  . aspirin EC  81 mg Oral Daily  . bisacodyl  10 mg Oral Daily   Or  . bisacodyl  10 mg Rectal Daily  . docusate sodium  200 mg Oral Daily  . furosemide  40 mg Oral BID  . levothyroxine  88 mcg Oral QAC  breakfast  . losartan  50 mg Oral Daily  . metoCLOPramide  10 mg Oral TID AC & HS  . pantoprazole  40 mg Oral Daily  . potassium chloride  20 mEq Oral Daily  . sodium chloride  10-40 mL Intracatheter Q12H  . sodium chloride  3 mL Intravenous Q12H  . spironolactone  25 mg Oral Daily  . vancomycin  1,250 mg Intravenous Q24H  . warfarin  2 mg Oral q1800  . Warfarin - Physician Dosing Inpatient   Does not apply q1800   Continuous Infusions:  PRN Meds:.sodium chloride, acetaminophen, HYDROcodone-acetaminophen, ipratropium-albuterol, metoprolol, ondansetron (ZOFRAN) IV, oxyCODONE, sodium chloride, sodium chloride, traMADol  Xrays No results found.  Assessment/Plan: S/P Procedure(s) (LRB): Pacemaker Implant (N/A)  1 stable 2 no fevers, sternum stable, culture pending- cont vanco 3 cont current diuretics 4 v pacing, increase ARB to home dose for HTN  5 cont coumadin 6 recheck labs  LOS: 10 days    GOLD,WAYNE E 05/05/2015  I have seen and examined the patient and agree with the assessment and plan as outlined.  Rexene Alberts 05/05/2015 8:46 AM

## 2015-05-05 NOTE — Care Management Note (Signed)
Case Management Note Initial CM note entered by [facility] 05/02/15  Patient Details  Name: CYNTHA BRICKMAN MRN: [id number] Date of Birth: 20-Aug-1935  Subjective/Objective:    Pt lives with spouse who will provide 24/7 assistance upon discharge.  PT/OT recommend home therapy, 3-N-1, and rolling walker, pt and spouse agree.  Provided list of agencies in [facility], referral made to [facility] per request.                            Expected Discharge Plan:  Portal  Discharge planning Services  CM Consult  Post Acute Care Choice:  Durable Medical Equipment, Home Health Choice offered to:  Spouse  DME Arranged:  3-N-1, Walker rolling, tub seat DME Agency:  [facility] Arranged:  PT, OT [facility] Agency:  [place]  Status of Service:  In process, will continue to follow  Medicare Important Message Given:  Yes-third notification given  Additional Comments: 05/05/2015 [name], RN, BSN [phone] CM provided HH/DME order request on physician sticky note, and requested via bedside nurse.   Maryclare Labrador, RN 05/05/2015, 12:04 PM

## 2015-05-05 NOTE — Progress Notes (Signed)
CARDIAC REHAB PHASE I   PRE:  Rate/Rhythm: 70 pacing  BP:  Supine:   Sitting: 134/60  Standing:    SaO2: 93% 2L  MODE:  Ambulation: 150 ft   POST:  Rate/Rhythm: 73 paced  BP:  Supine:   Sitting: 114/49  Standing:    SaO2: 94% 2L 1320-1345 Pt walked 150 ft on 2L with gait belt use, rolling walker and asst x 2. Pt exhausted by end of walk but did not need to sit. Took a couple of standing rest breaks. Legs more swollen today. Put pillow under feet and elevated. Some DOE noted. Will keep as asst x 2.   Graylon Good, RN BSN  05/05/2015 1:42 PM

## 2015-05-05 NOTE — Care Management Important Message (Signed)
Important Message  Patient Details  Name: [name] MRN: [id number] Date of Birth: [birth year]   Medicare Important Message Given:  Yes-third notification given    Maryclare Labrador, RN 05/05/2015, 12:03 PM

## 2015-05-06 LAB — BASIC METABOLIC PANEL
ANION GAP: 9 (ref 5–15)
BUN: 11 mg/dL (ref 6–20)
CALCIUM: 8.3 mg/dL — AB (ref 8.9–10.3)
CO2: 34 mmol/L — ABNORMAL HIGH (ref 22–32)
Chloride: 95 mmol/L — ABNORMAL LOW (ref 101–111)
Creatinine, Ser: 0.67 mg/dL (ref 0.44–1.00)
GLUCOSE: 141 mg/dL — AB (ref 65–99)
POTASSIUM: 3.5 mmol/L (ref 3.5–5.1)
SODIUM: 138 mmol/L (ref 135–145)

## 2015-05-06 LAB — CBC WITH DIFFERENTIAL/PLATELET
BASOS ABS: 0 10*3/uL (ref 0.0–0.1)
BASOS PCT: 0 % (ref 0–1)
EOS ABS: 0.3 10*3/uL (ref 0.0–0.7)
EOS PCT: 2 % (ref 0–5)
HCT: 26.5 % — ABNORMAL LOW (ref 36.0–46.0)
Hemoglobin: 8.2 g/dL — ABNORMAL LOW (ref 12.0–15.0)
Lymphocytes Relative: 4 % — ABNORMAL LOW (ref 12–46)
Lymphs Abs: 0.7 10*3/uL (ref 0.7–4.0)
MCH: 29.4 pg (ref 26.0–34.0)
MCHC: 30.9 g/dL (ref 30.0–36.0)
MCV: 95 fL (ref 78.0–100.0)
MONO ABS: 1.2 10*3/uL — AB (ref 0.1–1.0)
Monocytes Relative: 8 % (ref 3–12)
Neutro Abs: 12.9 10*3/uL — ABNORMAL HIGH (ref 1.7–7.7)
Neutrophils Relative %: 86 % — ABNORMAL HIGH (ref 43–77)
PLATELETS: 306 10*3/uL (ref 150–400)
RBC: 2.79 MIL/uL — AB (ref 3.87–5.11)
RDW: 17 % — AB (ref 11.5–15.5)
WBC: 15.1 10*3/uL — AB (ref 4.0–10.5)

## 2015-05-06 LAB — VANCOMYCIN, TROUGH: Vancomycin Tr: 9 ug/mL — ABNORMAL LOW (ref 10.0–20.0)

## 2015-05-06 LAB — PROTIME-INR
INR: 2.51 — AB (ref 0.00–1.49)
PROTHROMBIN TIME: 26.7 s — AB (ref 11.6–15.2)

## 2015-05-06 MED ORDER — WARFARIN SODIUM 1 MG PO TABS
1.0000 mg | ORAL_TABLET | Freq: Every day | ORAL | Status: DC
  Administered 2015-05-06 – 2015-05-07 (×2): 1 mg via ORAL
  Filled 2015-05-06 (×4): qty 1

## 2015-05-06 MED ORDER — FUROSEMIDE 40 MG PO TABS
40.0000 mg | ORAL_TABLET | Freq: Two times a day (BID) | ORAL | Status: DC
  Administered 2015-05-06 – 2015-05-09 (×7): 40 mg via ORAL
  Filled 2015-05-06 (×12): qty 1

## 2015-05-06 MED ORDER — FOLIC ACID 1 MG PO TABS
1.0000 mg | ORAL_TABLET | Freq: Every day | ORAL | Status: DC
  Administered 2015-05-06 – 2015-05-11 (×6): 1 mg via ORAL
  Filled 2015-05-06 (×8): qty 1

## 2015-05-06 MED ORDER — WARFARIN SODIUM 1 MG PO TABS: 1.0000 mg | ORAL_TABLET | Freq: Every day | ORAL | Status: DC

## 2015-05-06 MED ORDER — LOSARTAN POTASSIUM 50 MG PO TABS
100.0000 mg | ORAL_TABLET | Freq: Every day | ORAL | Status: DC
  Administered 2015-05-06 – 2015-05-11 (×6): 100 mg via ORAL
  Filled 2015-05-06 (×7): qty 2

## 2015-05-06 MED ORDER — FERROUS SULFATE 325 (65 FE) MG PO TABS
325.0000 mg | ORAL_TABLET | Freq: Every day | ORAL | Status: DC
  Administered 2015-05-07 – 2015-05-11 (×5): 325 mg via ORAL
  Filled 2015-05-06 (×8): qty 1

## 2015-05-06 MED ORDER — VANCOMYCIN HCL 10 G IV SOLR
1500.0000 mg | INTRAVENOUS | Status: DC
  Administered 2015-05-07 – 2015-05-11 (×5): 1500 mg via INTRAVENOUS
  Filled 2015-05-06 (×5): qty 1500

## 2015-05-06 MED ORDER — CIPROFLOXACIN HCL 500 MG PO TABS
500.0000 mg | ORAL_TABLET | Freq: Two times a day (BID) | ORAL | Status: DC
  Administered 2015-05-06 – 2015-05-11 (×11): 500 mg via ORAL
  Filled 2015-05-06 (×16): qty 1

## 2015-05-06 MED ORDER — POTASSIUM CHLORIDE CRYS ER 20 MEQ PO TBCR
40.0000 meq | EXTENDED_RELEASE_TABLET | Freq: Once | ORAL | Status: AC
  Administered 2015-05-06: 40 meq via ORAL
  Filled 2015-05-06: qty 2

## 2015-05-06 MED ORDER — POTASSIUM CHLORIDE CRYS ER 20 MEQ PO TBCR
20.0000 meq | EXTENDED_RELEASE_TABLET | Freq: Two times a day (BID) | ORAL | Status: DC
  Administered 2015-05-06 – 2015-05-09 (×7): 20 meq via ORAL
  Filled 2015-05-06 (×11): qty 1

## 2015-05-06 MED ORDER — FUROSEMIDE 10 MG/ML IJ SOLN
40.0000 mg | Freq: Once | INTRAMUSCULAR | Status: AC
  Administered 2015-05-06: 40 mg via INTRAVENOUS
  Filled 2015-05-06: qty 4

## 2015-05-06 NOTE — Progress Notes (Signed)
CARDIAC REHAB PHASE I   PRE:  Rate/Rhythm: 72  BP:  Sitting: 153/48     SaO2: 97% 2 liters  MODE:  Ambulation: 150 ft   POST:  Rate/Rhythm: 81  BP:  Sitting: 127/45     SaO2: 90% 2 liters  9:10am-9:35am Patient ambulated x2 assist with rolling walker. Patient stated that she felt weak but a little bit better than prior days. Need multiple rest breaks.  Placed back in chair with call bell in reach.   Estelle Skibicki, [place], [place] 05/06/2015 9:32 AM

## 2015-05-06 NOTE — Progress Notes (Signed)
ANTIBIOTIC CONSULT NOTE - FOLLOW UP  Pharmacy Consult for vancomycin Indication: sternal wound  Allergies  Allergen Reactions  . Augmentin [Amoxicillin-Pot Clavulanate] Swelling    Swollen joints, pain  . Nifedipine     Other reaction(s): Unknown  . Penicillins     Other reaction(s): Other (See Comments) Swollen joints  . Propranolol     Other reaction(s): Unknown  . Ace Inhibitors     Other reaction(s): Cough  . Diazepam     Other reaction(s): Other (See Comments) Made her "hyper"  . Meperidine     Other reaction(s): Other (See Comments) Made her "hyper"  . Propoxyphene     Other reaction(s): Other (See Comments) Made her "hyper"    Patient Measurements: Height: 5\' 2"  (157.5 cm) Weight: 147 lb 1.6 oz (66.724 kg) IBW/kg (Calculated) : 50.1  Vital Signs: Temp: 97.7 F (36.5 C) (08/27 0348) Temp Source: Oral (08/27 0348) BP: 144/64 mmHg (08/27 0942) Pulse Rate: 71 (08/27 0942) Intake/Output from previous day: 08/26 0701 - 08/27 0700 In: 480 [P.O.:480] Out: 452 [Urine:450; Stool:2] Intake/Output from this shift: Total I/O In: 240 [P.O.:240] Out: 775 [Urine:775]  Labs:  Recent Labs  05/06/15 0508  WBC 15.1*  HGB 8.2*  PLT 306  CREATININE 0.67   Estimated Creatinine Clearance: 51.9 mL/min (by C-G formula based on Cr of 0.67).  Recent Labs  05/06/15 1200  [place] 9*     Microbiology: Recent Results (from the past 720 hour(s))  Surgical pcr screen     Status: None   Collection Time: 04/21/15 12:43 PM  Result Value Ref Range Status   MRSA, PCR NEGATIVE NEGATIVE Final   Staphylococcus aureus NEGATIVE NEGATIVE Final    Comment:        The Xpert SA Assay (FDA approved for NASAL specimens in patients over 52 years of age), is one component of a comprehensive surveillance program.  Test performance has been validated by [facility] for patients greater than or equal to 27 year old. It is not intended to diagnose infection nor to guide or  monitor treatment.   Wound culture     Status: None (Preliminary result)   Collection Time: 05/04/15 11:20 AM  Result Value Ref Range Status   Specimen Description WOUND STERNUM  Final   Special Requests Normal  Final   Gram Stain   Final    RARE WBC PRESENT, PREDOMINANTLY PMN NO SQUAMOUS EPITHELIAL CELLS SEEN NO ORGANISMS SEEN Performed at [facility]    Culture   Final    NO GROWTH 1 DAY Performed at [facility]    Report Status PENDING  Incomplete    Anti-infectives    Start     Dose/Rate Route Frequency Ordered Stop   05/06/15 1230  ciprofloxacin (CIPRO) tablet 500 mg     500 mg Oral 2 times daily 05/06/15 1219     05/04/15 1230  vancomycin (VANCOCIN) 1,250 mg in sodium chloride 0.9 % 250 mL IVPB     1,250 mg 166.7 mL/hr over 90 Minutes Intravenous Every 24 hours 05/04/15 1225     05/04/15 0845  vancomycin (VANCOCIN) IVPB 1000 mg/200 mL premix  Status:  Discontinued     1,000 mg 200 mL/hr over 60 Minutes Intravenous  Once 05/04/15 0832 05/04/15 1219   05/03/15 0500  vancomycin (VANCOCIN) IVPB 1000 mg/200 mL premix     1,000 mg 200 mL/hr over 60 Minutes Intravenous Every 12 hours 05/02/15 2202 05/03/15 0605   05/02/15 0800  gentamicin (GARAMYCIN)  80 mg in sodium chloride irrigation 0.9 % 500 mL irrigation     80 mg Irrigation To Cath Lab 05/01/15 1826 05/02/15 1841   05/02/15 0800  vancomycin (VANCOCIN) IVPB 1000 mg/200 mL premix     1,000 mg 200 mL/hr over 60 Minutes Intravenous To Cath Lab 05/01/15 1826 05/02/15 1815   04/26/15 1845  vancomycin (VANCOCIN) IVPB 1000 mg/200 mL premix  Status:  Discontinued     1,000 mg 200 mL/hr over 60 Minutes Intravenous  Once 04/25/15 1629 04/25/15 1700   04/26/15 0600  levofloxacin (LEVAQUIN) IVPB 750 mg     750 mg 100 mL/hr over 90 Minutes Intravenous Every 24 hours 04/25/15 1629 04/26/15 0653   04/25/15 1845  vancomycin (VANCOCIN) IVPB 1000 mg/200 mL premix     1,000 mg 200 mL/hr over 60 Minutes Intravenous   Once 04/25/15 1700 04/25/15 1906   04/25/15 0400  vancomycin (VANCOCIN) 1,250 mg in sodium chloride 0.9 % 250 mL IVPB     1,250 mg 166.7 mL/hr over 90 Minutes Intravenous To Surgery 04/24/15 1426 04/25/15 0848   04/25/15 0400  levofloxacin (LEVAQUIN) IVPB 500 mg     500 mg 100 mL/hr over 60 Minutes Intravenous To Surgery 04/24/15 1426 04/25/15 0848   04/25/15 0400  vancomycin (VANCOCIN) 1,000 mg in sodium chloride 0.9 % 1,000 mL irrigation      Irrigation To Surgery 04/24/15 1426 04/25/15 0829      Assessment: 80 year old female s/p open heart surgery 8/16 with sternal wound on vancomycin. WBC= 15.1, afebrile, SCr= 0.67 and CrCL ~ 50. Vancomycin trough today is below goal (trough= 9). Cipro added per MD.   Vancomycin 8/25>   -VT 9 on 8/27 Cipro 8/27>>  Wound Cx 8/25> ngtd Wound Cx 8/27>  Goal of Therapy:  Vancomycin trough level 10-15 mcg/ml  Plan:  -Change vancomycin to 1500mg  IV q24hr -Will follow renal function, cultures and clinical progress  Hildred Laser, Pharm D 05/06/2015 2:45 PM

## 2015-05-06 NOTE — Progress Notes (Signed)
   05/06/15 1600  Mobility  Assistive Device Front wheel walker  Distance Ambulated (ft) 150 ft  Ambulation Response Tolerated fair   Ambulated with 2 nurses, one pushing chair behind patient on 2L [place]. Patient walked slowly and took 3 brief stopping breaks. Assisted to chair, call bell within reach.

## 2015-05-06 NOTE — Progress Notes (Addendum)
      [address]       [place] [address]             [phone]        4 Days Post-Op Procedure(s) (LRB): Pacemaker Implant (N/A)  Subjective: Patient without specific complaint this am.  Objective: Vital signs in last 24 hours: Temp:  [97.7 F (36.5 C)-98.4 F (36.9 C)] 97.7 F (36.5 C) (08/27 0348) Pulse Rate:  [68-70] 70 (08/27 0348) Cardiac Rhythm:  [-] Ventricular paced (08/26 1940) Resp:  [17-20] 20 (08/27 0348) BP: (118-156)/(49-66) 156/66 mmHg (08/27 0348) SpO2:  [97 %-99 %] 97 % (08/27 0348) Weight:  [147 lb 1.6 oz (66.724 kg)] 147 lb 1.6 oz (66.724 kg) (08/27 0348)  Pre op weight 66 kg Current Weight  05/06/15 147 lb 1.6 oz (66.724 kg)      Intake/Output from previous day: 08/26 0701 - 08/27 0700 In: 480 [P.O.:480] Out: 452 [Urine:450; Stool:2]   Physical Exam:  Cardiovascular: RRR, systolic murmur Pulmonary: Mildly diminished at bases; no rales, wheezes, or rhonchi. Abdomen: Soft, non tender, bowel sounds present. Extremities: Bilateral ankle and feet edema. Wounds: Lower sternal wound with purulence and slight erythema around skin edge  Lab Results: CBC: Recent Labs  05/06/15 0508  WBC 15.1*  HGB 8.2*  HCT 26.5*  PLT 306   BMET:  Recent Labs  05/06/15 0508  NA 138  K 3.5  CL 95*  CO2 34*  GLUCOSE 141*  BUN 11  CREATININE 0.67  CALCIUM 8.3*    PT/INR:  Lab Results  Component Value Date   INR 2.51* 05/06/2015   INR 2.17* 05/05/2015   INR 2.16* 05/04/2015   ABG:  INR: Will add last result for INR, ABG once components are confirmed Will add last 4 CBG results once components are confirmed  Assessment/Plan:  1. CV - Paced at 70. On Amiodarone 200 mg bid, Cozaar 50 mg daily, Spironolactone 25 mg daily, and Coumadin. Hypertensive so will increase Cozaar to pre op dose of 100 mg daily. On Coumadin 2 mg and INR increased from 2.17 to 2.51. Will give 1 mg of Coumadin tonight. 2.  Pulmonary - On 2 liters of  oxygen via [place]. Wean to room air as tolerates. Encourage incentive spirometer 3. Volume Overload - On Lasix 40 mg bid. As discussed with Dr. Roxy Manns, will give Lasix 40 mg IV this am and continue bid with this evening's dose 4.  Acute blood loss anemia - H and H 8.2 and 26.5. Will start folic acid and oral Ferrous. 5. Supplement potassium 6.ID-on Vancomycin for lower sternal wound infection.WBC up to 15,100 but remains afebrile. Wound culture showed no growth to date. Will repeat wound culture in am  ZIMMERMAN,DONIELLE MPA-C 05/06/2015,8:06 AM  I have seen and examined the patient and agree with the assessment and plan as outlined.  Previous wound culture "no growth" - will repeat and broaden antibiotic coverage.  Will use Cipro and watch for interaction with amiodarone and warfarin.  Rexene Alberts 05/06/2015 12:17 PM

## 2015-05-07 LAB — CBC
HEMATOCRIT: 26.6 % — AB (ref 36.0–46.0)
Hemoglobin: 8.3 g/dL — ABNORMAL LOW (ref 12.0–15.0)
MCH: 30.4 pg (ref 26.0–34.0)
MCHC: 31.2 g/dL (ref 30.0–36.0)
MCV: 97.4 fL (ref 78.0–100.0)
PLATELETS: 324 10*3/uL (ref 150–400)
RBC: 2.73 MIL/uL — ABNORMAL LOW (ref 3.87–5.11)
RDW: 17.3 % — AB (ref 11.5–15.5)
WBC: 18.2 10*3/uL — AB (ref 4.0–10.5)

## 2015-05-07 LAB — PROTIME-INR
INR: 2.51 — AB (ref 0.00–1.49)
Prothrombin Time: 26.8 seconds — ABNORMAL HIGH (ref 11.6–15.2)

## 2015-05-07 LAB — WOUND CULTURE
Culture: NO GROWTH
Special Requests: NORMAL

## 2015-05-07 NOTE — Progress Notes (Signed)
   05/07/15 1740  Mobility  Activity Ambulate in hall  Level of Assistance Contact guard assist, steadying assist  Assistive Device Front wheel walker  Distance Ambulated (ft) 150 ft  Ambulation Response Tolerated fair

## 2015-05-07 NOTE — Progress Notes (Signed)
   05/07/15 1100  Mobility  Activity Ambulate in hall  Level of Assistance Contact guard assist, steadying assist  Assistive Device Front wheel walker  Distance Ambulated (ft) 150 ft  Ambulation Response Tolerated fair  Pt ambulated slowly on 2L oxygen and taking 3 standing rest breaks to catch her breath.

## 2015-05-07 NOTE — Progress Notes (Signed)
RN noticed that chest tube sites left and right under breast had scant purulent drainage. RN redressed the chest tube sites and applied vasoine strips and gauze. There are no stitches noted.   RN will continue to monitor.   [name]

## 2015-05-07 NOTE — Progress Notes (Addendum)
      [address]       [place] [address]             [phone]        5 Days Post-Op Procedure(s) (LRB): Pacemaker Implant (N/A)  Subjective: Patient without specific complaint this am.  Objective: Vital signs in last 24 hours: Temp:  [97.3 F (36.3 C)-97.7 F (36.5 C)] 97.5 F (36.4 C) (08/28 0343) Pulse Rate:  [71-77] 71 (08/28 0343) Cardiac Rhythm:  [-] Ventricular paced (08/27 1937) Resp:  [18] 18 (08/28 0343) BP: (117-148)/(62-96) 133/96 mmHg (08/28 0343) SpO2:  [96 %-99 %] 96 % (08/28 0343) Weight:  [146 lb (66.225 kg)] 146 lb (66.225 kg) (08/28 0343)  Pre op weight 66 kg Current Weight  05/07/15 146 lb (66.225 kg)      Intake/Output from previous day: 08/27 0701 - 08/28 0700 In: 363 [P.O.:360; I.V.:3] Out: 1675 [Urine:1675]   Physical Exam:  Cardiovascular: RRR, systolic murmur Pulmonary: Mildly diminished at bases; no rales, wheezes, or rhonchi. Abdomen: Soft, non tender, bowel sounds present. Extremities: Bilateral ankle and feet edema but is decreasing Wounds: Lower sternal wound with purulence  Lab Results: CBC:  Recent Labs  05/06/15 0508 05/07/15 0506  WBC 15.1* 18.2*  HGB 8.2* 8.3*  HCT 26.5* 26.6*  PLT 306 324   BMET:   Recent Labs  05/06/15 0508  NA 138  K 3.5  CL 95*  CO2 34*  GLUCOSE 141*  BUN 11  CREATININE 0.67  CALCIUM 8.3*    PT/INR:  Lab Results  Component Value Date   INR 2.51* 05/07/2015   INR 2.51* 05/06/2015   INR 2.17* 05/05/2015   ABG:  INR: Will add last result for INR, ABG once components are confirmed Will add last 4 CBG results once components are confirmed  Assessment/Plan:  1. CV - Paced at 70. On Amiodarone 200 mg bid, Cozaar 100 mg daily, Spironolactone 25 mg daily, and Coumadin. On Coumadin 1 mg and INR remains 2.51. Will monitor INR closely as now on Cipro 2.  Pulmonary - On 2 liters of oxygen via [place]. She has a history of COPD and was on oxygen prior to  surgery.Encourage incentive spirometer 3. Volume Overload - On Lasix 40 mg bid. Likely decrease to daily at discharge 4.  Acute blood loss anemia - H and H 8.3 and 26.. Continue folic acid and oral Ferrous. 5.ID-on Vancomycin and Cipro for lower sternal wound infection.WBC up to 18,200 but remains afebrile. Wound culture showed no growth to date. Will repeat wound culture  this am.  ZIMMERMAN,DONIELLE MPA-C 05/07/2015,7:52 AM    I have seen and examined the patient and agree with the assessment and plan as outlined.  Rexene Alberts 05/07/2015 10:39 AM

## 2015-05-08 ENCOUNTER — Inpatient Hospital Stay (HOSPITAL_COMMUNITY): Payer: Medicare PPO

## 2015-05-08 LAB — WOUND CULTURE
CULTURE: NO GROWTH
Special Requests: NORMAL

## 2015-05-08 LAB — PROTIME-INR
INR: 2.43 — ABNORMAL HIGH (ref 0.00–1.49)
PROTHROMBIN TIME: 26.1 s — AB (ref 11.6–15.2)

## 2015-05-08 NOTE — Progress Notes (Signed)
CARDIAC REHAB PHASE I   PRE:  Rate/Rhythm: 71 paced  BP:  Sitting: 150/85 (R Leg)        SaO2: 98 2L  MODE:  Ambulation: 150 ft   POST:  Rate/Rhythm: 74 paced  BP:  Sitting: 149/84         SaO2: 89 2L, 92 on 2 L after 2 minutes rest  Pt ambulated 150 ft on 2L O2, rolling walker, gait belt, assist x1, fairly steady gait, tolerated well. Pt did c/o DOE, fatigue, brief standing rest x3. Pt does show increased strength, but declined to ambulated farther than 150 ft. Sats 89% on 2L post-ambulation, quickly increased to 91-91% on 2L with 1 minute rest and deep breathing.  Pt to recliner after walk, feet elevated, call bell within reach. Encouraged ambulation, IS, flutter valve. Pt verbalized understanding.  [id number]   Lenna Sciara, RN, BSN 05/08/2015 8:36 AM

## 2015-05-08 NOTE — Progress Notes (Signed)
Pt and husband agreeable to rehab- CSW initiated bed search.  Full assessment to follow.  Domenica Reamer, [place] Social Worker [phone]

## 2015-05-08 NOTE — Progress Notes (Signed)
Pt ambulated 100 feet in hallway this evening with use of rolling walker. She tolerated well with 2 short rest breaks. She is able to get up with only stand by assistance. She has been ambulating to bathroom without any shortness of breath noted.

## 2015-05-08 NOTE — Progress Notes (Signed)
Utilization review completed.  

## 2015-05-08 NOTE — Care Management Important Message (Signed)
Important Message  Patient Details  Name: [name] MRN: [id number] Date of Birth: 03-31-1935   Medicare Important Message Given:  Yes-second notification given    Loann Quill 05/08/2015, 3:48 PM

## 2015-05-08 NOTE — Progress Notes (Addendum)
[address]       [place] [address]             [phone]      6 Days Post-Op Procedure(s) (LRB): Pacemaker Implant (N/A) Subjective: Feeling stronger, sternal drainage conts  Objective: Vital signs in last 24 hours: Temp:  [97.6 F (36.4 C)-98.4 F (36.9 C)] 97.9 F (36.6 C) (08/29 0456) Pulse Rate:  [70-71] 71 (08/29 0456) Cardiac Rhythm:  [-] Ventricular paced (08/28 1956) Resp:  [18-20] 20 (08/29 0456) BP: (132-150)/(68-91) 150/76 mmHg (08/29 0456) SpO2:  [96 %-98 %] 98 % (08/29 0456) Weight:  [145 lb 6.4 oz (65.953 kg)] 145 lb 6.4 oz (65.953 kg) (08/29 0146)  Hemodynamic parameters for last 24 hours:    Intake/Output from previous day: 08/28 0701 - 08/29 0700 In: 720 [P.O.:720] Out: 2525 [Urine:2525] Intake/Output this shift:    General appearance: alert, cooperative and no distress Heart: regular rate and rhythm Lungs: clear to auscultation bilaterally Abdomen: benign Extremities: edema conts to improve Wound: sternum is stable without click, + purulent drainage lower pole  Lab Results:  Recent Labs  05/06/15 0508 05/07/15 0506  WBC 15.1* 18.2*  HGB 8.2* 8.3*  HCT 26.5* 26.6*  PLT 306 324   BMET:  Recent Labs  05/06/15 0508  NA 138  K 3.5  CL 95*  CO2 34*  GLUCOSE 141*  BUN 11  CREATININE 0.67  CALCIUM 8.3*    PT/INR:  Recent Labs  05/08/15 0544  LABPROT 26.1*  INR 2.43*   ABG    Component Value Date/Time   PHART 7.391 04/26/2015 0952   HCO3 25.9* 04/26/2015 0952   TCO2 34 04/30/2015 1735   ACIDBASEDEF 3.0* 04/26/2015 0832   O2SAT 61.7 04/29/2015 0710   CBG (last 3)  No results for input(s): GLUCAP in the last 72 hours.  Meds Scheduled Meds: . amiodarone  200 mg Oral BID  . aspirin EC  81 mg Oral Daily  . bisacodyl  10 mg Oral Daily   Or  . bisacodyl  10 mg Rectal Daily  . ciprofloxacin  500 mg Oral BID  . docusate sodium  200 mg Oral Daily  . ferrous sulfate  325 mg Oral Q breakfast  .  folic acid  1 mg Oral Daily  . furosemide  40 mg Oral BID  . levothyroxine  88 mcg Oral QAC breakfast  . losartan  100 mg Oral Daily  . metoCLOPramide  10 mg Oral TID AC & HS  . pantoprazole  40 mg Oral Daily  . potassium chloride  20 mEq Oral BID  . sodium chloride  10-40 mL Intracatheter Q12H  . sodium chloride  3 mL Intravenous Q12H  . spironolactone  25 mg Oral Daily  . vancomycin  1,500 mg Intravenous Q24H  . warfarin  1 mg Oral q1800  . Warfarin - Physician Dosing Inpatient   Does not apply q1800   Continuous Infusions:  PRN Meds:.sodium chloride, acetaminophen, HYDROcodone-acetaminophen, ipratropium-albuterol, metoprolol, ondansetron (ZOFRAN) IV, oxyCODONE, sodium chloride, sodium chloride, traMADol  Xrays No results found.  Assessment/Plan: S/P Procedure(s) (LRB): Pacemaker Implant (N/A)  1 doing well overall 2 she has home O2, but she and husband are now considering short term rehab- will ask SW to see 3 cont current abx - doubt we will get growth on culture at this point  4 INR ok on current dose 5 v paced - PPM 6 SBP a bit high at times- observe on  current rx. May be able to decrease lasix soon     LOS: 13 days    GOLD,WAYNE E 05/08/2015  I have seen and examined the patient and agree with the assessment and plan as outlined.  However, WBC continues to climb and sternal wound drainage persists.  Will obtain non-contrast CT scan of chest and plan sternal wound debridement in OR tomorrow.  Hold coumadin.  Continue Vanc and Cipro  Rexene Alberts 05/08/2015 8:53 AM

## 2015-05-08 NOTE — Clinical Social Work Placement (Signed)
   CLINICAL SOCIAL WORK PLACEMENT  NOTE  Date:  05/08/2015  Patient Details  Name: [name] MRN: [id number] Date of Birth: 07/13/1935  Clinical Social Work is seeking post-discharge placement for this patient at the [place] level of care (*CSW will initial, date and re-position this form in  chart as items are completed):  Yes   Patient/family provided with [facility] Work Department's list of facilities offering this level of care within the geographic area requested by the patient (or if unable, by the patient's family).  Yes   Patient/family informed of their freedom to choose among providers that offer the needed level of care, that participate in Medicare, Medicaid or managed care program needed by the patient, have an available bed and are willing to accept the patient.  Yes   Patient/family informed of [facility]'s ownership interest in [facility] and [facility], as well as of the fact that they are under no obligation to receive care at these facilities.  PASRR submitted to EDS on 05/08/15     PASRR number received on 05/08/15     Existing PASRR number confirmed on       FL2 transmitted to all facilities in geographic area requested by pt/family on 05/08/15     FL2 transmitted to all facilities within larger geographic area on       Patient informed that his/her managed care company has contracts with or will negotiate with certain facilities, including the following:            Patient/family informed of bed offers received.  Patient chooses bed at       Physician recommends and patient chooses bed at      Patient to be transferred to   on  .  Patient to be transferred to facility by       Patient family notified on   of transfer.  Name of family member notified:        PHYSICIAN Please sign FL2     Additional Comment:    _______________________________________________ Cranford Mon, LCSW 05/08/2015, 1:02  PM

## 2015-05-08 NOTE — Clinical Social Work Note (Signed)
Clinical Social Work Assessment  Patient Details  Name: [name] MRN: [id number] Date of Birth: [birth year]  Date of referral:  05/08/15               Reason for consult:  Facility Placement                Permission sought to share information with:  Family Supports, Chartered certified accountant granted to share information::  Yes, Verbal Permission Granted  Name::     Tree surgeon::  [facility]  Relationship::  spouse  Contact Information:     Housing/Transportation Living arrangements for the past 2 months:  [place] of Information:  Patient, Spouse Patient Interpreter Needed:  None Criminal Activity/Legal Involvement Pertinent to Current Situation/Hospitalization:  No - Comment as needed Significant Relationships:  Spouse Lives with:  Spouse Do you feel safe going back to the place where you live?  No Need for family participation in patient care:  Yes (Comment)  Care giving concerns:  Pt lives at home with husband- husband and pt concerned about pt current level of needs and safety at home- husband reports he can not provide much assistance around the home   Social Worker assessment / plan:  CSW spoke with pt and spouse about SNF as alternative to return home  Employment status:  Retired Nurse, adult PT Recommendations:  Home with [place], [address] / Referral to community resources:  [place]  Patient/Family's Response to care:  Pt and husband agreeable to SNF placement preferably in [facility]- pt has not been to SNF before but has heard good things about local facilities.    Patient/Family's Understanding of and Emotional Response to Diagnosis, Current Treatment, and Prognosis:  No questions or concerns  Emotional Assessment Appearance:  Appears stated age Attitude/Demeanor/Rapport:    Affect (typically observed):  Appropriate, Quiet Orientation:   Oriented to Place, Oriented to Self, Oriented to  Time, Oriented to Situation Alcohol / Substance use:  Not Applicable Psych involvement (Current and /or in the community):  No (Comment)  Discharge Needs  Concerns to be addressed:  Care Coordination, Home Safety Concerns Readmission within the last 30 days:  No Current discharge risk:  Physical Impairment Barriers to Discharge:  Continued Medical Work up, [employer], [place], LCSW 05/08/2015, 12:58 PM

## 2015-05-08 NOTE — Plan of Care (Signed)
Problem: Surgery Discharge Goal: Barriers To Progression Addressed/Resolved Outcome: Progressing Pt still being treated for infection to mid sternal incision site. She continues on IV and po antibiotics. Wound still noted with purulent drainage. Wound specimen sent for culture today. Awaiting results. No other signs of infection noted. Continue to monitor.

## 2015-05-08 NOTE — Progress Notes (Signed)
Occupational Therapy Treatment Patient Details Name: [name] MRN: [id number] DOB: 09-Sep-1935 Today's Date: 05/08/2015    History of present illness Admitted 04/25/15 for MVR, tricuspid valve repair, CABG x 1, and clipping of atrial appendage. Post-op hypotension requiring pressors and N/V PMHx- COPD (home O2), CHF, Menieres, HTN, Rt breast Ca; bil cochlear implants. Pt is now s/p PPM implant 05/02/15.   OT comments  Pt. Making great gains.  Able to complete grooming task in standing.  Educated pt. And spouse on implementation energy conservation techniques as needed.  Husband reports concerns for being able to "properly" take care of her at home.  States he is thinking about short term SNF for "aggressive PT".  Is wanting to meet with case manager to discuss, states RN is aware of his request to meet with case manger.    Follow Up Recommendations  Home health OT;Supervision/Assistance - 24 hour;SNF (husband reports snf may be needed based on pt. progress)    Equipment Recommendations  3 in 1 bedside comode    Recommendations for Other Services      Precautions / Restrictions Precautions Precautions: Sternal;Fall;ICD/Pacemaker Precaution Comments: educated on sternal and pacemaker precautions       Mobility Bed Mobility               General bed mobility comments: pt. was sitting in recliner upon arrival  Transfers Overall transfer level: Needs assistance   Transfers: Sit to/from Stand;Stand Pivot Transfers Sit to Stand: Min guard Stand pivot transfers: Min guard       General transfer comment: cues for integration of sternal precautions during functional mobility    Balance                                   ADL Overall ADL's : Needs assistance/impaired     Grooming: Wash/dry hands;Wash/dry face;Oral care;Min guard;Standing                   Toilet Transfer: Min guard;Ambulation;Minimal assistance;RW;Regular Toilet   Toileting-  Clothing Manipulation and Hygiene: Minimal assistance;Sit to/from stand       Functional mobility during ADLs: Min guard;Rolling walker General ADL Comments: reviewed energy conservation with pt.s husband, also discussed importance of pt. attempting as many adls as she can safely to continue to progress with endurance and independence with adls      Vision                     Perception     Praxis      Cognition   Behavior During Therapy: [facility] for tasks assessed/performed Overall Cognitive Status: Within Functional Limits for tasks assessed                       Extremity/Trunk Assessment               Exercises     Shoulder Instructions       General Comments      Pertinent Vitals/ Pain       Pain Assessment: No/denies pain  Home Living                                          Prior Functioning/Environment  Frequency Min 2X/week     Progress Toward Goals  OT Goals(current goals can now be found in the care plan section)  Progress towards OT goals: Progressing toward goals     Plan Discharge plan remains appropriate;Discharge plan needs to be updated    Co-evaluation                 End of Session Equipment Utilized During Treatment: Gait belt;Rolling walker;Oxygen   Activity Tolerance Patient tolerated treatment well   Patient Left in chair;with call bell/phone within reach;with family/visitor present   Nurse Communication          Time: 2244-9753 OT Time Calculation (min): 18 min  Charges: OT General Charges $OT Visit: 1 Procedure OT Treatments $Self Care/Home Management : 8-22 mins  Janice Coffin, COTA/L 05/08/2015, 9:34 AM

## 2015-05-08 NOTE — Care Management Note (Signed)
Case Management Note Initial CM note entered by [facility] 05/02/15   Patient Details  Name: [name] MRN: [id number] Date of Birth: Sep 20, 1934  Subjective/Objective:    Pt lives with spouse who will provide 24/7 assistance upon discharge.  PT/OT recommend home therapy, 3-N-1, and rolling walker, pt and spouse agree.  Provided list of agencies in [facility], referral made to [place] per request.                            Expected Discharge Plan:  [place]  Discharge planning Services  CM Consult  Post Acute Care Choice:  Durable Medical Equipment, Home Health Choice offered to:  Spouse  DME Arranged:  3-N-1, Walker rolling, tub seat DME Agency:  [place] Arranged:  PT, OT [facility] Agency:  [place]  Status of Service:  In process, will continue to follow  Medicare Important Message Given:  Yes-third notification given  Discussed in Long Length of Stay: 05/09/15  Additional Comments:   05/08/15- [name] RN, BSN [phone]- pt continues to have elevated WBC and sternal wound drainage- plan for I&D in OR tomorrow- pt/spouse are interested in rehab option at this time CSW to follow for possible STSNF placement at discharge- NCM to continue to follow also for d/c needs  [name], RN, BSN [phone] CM provided HH/DME order request on physician sticky note, and requested via bedside nurse.   [name], RN 05/08/2015, 10:48 AM

## 2015-05-08 NOTE — Progress Notes (Signed)
Physical Therapy Treatment Patient Details Name: [name] MRN: [id number] DOB: [birth year] Today's Date: 05/08/2015    History of Present Illness Admitted 04/25/15 for MVR, tricuspid valve repair, CABG x 1, and clipping of atrial appendage. Post-op hypotension requiring pressors and N/V PMHx- COPD (home O2), CHF, [place]ie[place], HTN, Rt breast Ca; bil cochlear implants. Pt is now s/p PPM implant 05/02/15.    PT Comments    Pt progressing well towards physical therapy goals. Was able to perform transfers and ambulation well with rest breaks for fatigue. Pt on 2L/min supplemental O2 throughout session and sats remained ~95%. Tolerance for functional activity remains decreased, and pt is fearful of falling. Continue to feel that pt will benefit from post-acute rehab. Will continue to follow and progress as able per POC.   Follow Up Recommendations  Home health PT;Supervision for mobility/OOB     Equipment Recommendations  Rolling walker with 5" wheels    Recommendations for Other Services       Precautions / Restrictions Precautions Precautions: Sternal;Fall;ICD/Pacemaker Precaution Comments: Pt continues to require cues for maintaining sternal precautions.  Restrictions Weight Bearing Restrictions: No    Mobility  Bed Mobility               General bed mobility comments: Pt sitting up in recliner when PT arrived.   Transfers Overall transfer level: Needs assistance Equipment used: Rolling walker (2 wheeled) Transfers: Sit to/from Stand Sit to Stand: Min guard         General transfer comment: Frequent reminders to not use UE's during sit<>stand. Pt states she is just resting her arms on the arm rests but consistently appears to put weight through arms when transfers are initiated.   Ambulation/Gait Ambulation/Gait assistance: Min guard Ambulation Distance (Feet): 200 Feet Assistive device: Rolling walker (2 wheeled) Gait Pattern/deviations: Step-through  pattern;Decreased stride length;Trunk flexed Gait velocity: decreased Gait velocity interpretation: Below normal speed for age/gender General Gait Details: x3 standing rest breaks due to fatigue. Pt continues to self-monitor for fatigue and voices when she needs a break. ~115' pt visibly SOB and recovered within 30 seconds of standing rest. O2 sats remained ~95% on 2L/min supplemental O2 throughout.    Stairs            Wheelchair Mobility    Modified Rankin (Stroke Patients Only)       Balance Overall balance assessment: Needs assistance Sitting-balance support: Feet supported;No upper extremity supported Sitting balance-Leahy Scale: Good     Standing balance support: No upper extremity supported Standing balance-Leahy Scale: Fair Standing balance comment: Static standing                    Cognition Arousal/Alertness: Awake/alert Behavior During Therapy: WFL for tasks assessed/performed Overall Cognitive Status: Within Functional Limits for tasks assessed                      Exercises General Exercises - Lower Extremity Ankle Circles/Pumps: 10 reps Quad Sets: 10 reps Long Arc Quad: 10 reps Hip ABduction/ADduction: 10 reps Straight Leg Raises: 10 reps;Right    General Comments        Pertinent Vitals/Pain Pain Assessment: No/denies pain    Home Living                      Prior Function            PT Goals (current goals can now be found in the care plan section) Acute  Rehab PT Goals Patient Stated Goal: not stated PT Goal Formulation: With patient Time For Goal Achievement: 05/12/15 Potential to Achieve Goals: Good Progress towards PT goals: Progressing toward goals    Frequency  Min 3X/week    PT Plan Current plan remains appropriate    Co-evaluation             End of Session Equipment Utilized During Treatment: Gait belt;Oxygen Activity Tolerance: Patient tolerated treatment well Patient left: in  chair;with call bell/phone within reach;with nursing/sitter in room;with family/visitor present     Time: 4097-3532 PT Time Calculation (min) (ACUTE ONLY): 24 min  Charges:  $Gait Training: 8-22 mins $Therapeutic Activity: 8-22 mins                    G Codes:      Rolinda Roan 05-18-15, 2:52 PM   Rolinda Roan, PT, DPT Acute Rehabilitation Services Pager: [phone]

## 2015-05-09 ENCOUNTER — Encounter (HOSPITAL_COMMUNITY)
Admission: RE | Disposition: A | Discharge status: Skilled Nursing Facility | Payer: Self-pay | Source: Ambulatory Visit | Attending: Thoracic Surgery (Cardiothoracic Vascular Surgery)

## 2015-05-09 ENCOUNTER — Inpatient Hospital Stay (HOSPITAL_COMMUNITY): Payer: Medicare PPO | Admitting: Certified Registered"

## 2015-05-09 DIAGNOSIS — I9789 Other postprocedural complications and disorders of the circulatory system, not elsewhere classified: Secondary | ICD-10-CM

## 2015-05-09 HISTORY — PX: APPLICATION OF WOUND VAC: SHX5189

## 2015-05-09 HISTORY — PX: STERNAL WOUND DEBRIDEMENT: SHX1058

## 2015-05-09 LAB — CBC
HEMATOCRIT: 27.3 % — AB (ref 36.0–46.0)
HEMOGLOBIN: 8.3 g/dL — AB (ref 12.0–15.0)
MCH: 29.5 pg (ref 26.0–34.0)
MCHC: 30.4 g/dL (ref 30.0–36.0)
MCV: 97.2 fL (ref 78.0–100.0)
Platelets: 364 10*3/uL (ref 150–400)
RBC: 2.81 MIL/uL — AB (ref 3.87–5.11)
RDW: 17.8 % — ABNORMAL HIGH (ref 11.5–15.5)
WBC: 16.4 10*3/uL — AB (ref 4.0–10.5)

## 2015-05-09 LAB — BASIC METABOLIC PANEL
ANION GAP: 6 (ref 5–15)
BUN: 9 mg/dL (ref 6–20)
CHLORIDE: 95 mmol/L — AB (ref 101–111)
CO2: 35 mmol/L — ABNORMAL HIGH (ref 22–32)
Calcium: 8.7 mg/dL — ABNORMAL LOW (ref 8.9–10.3)
Creatinine, Ser: 0.65 mg/dL (ref 0.44–1.00)
GFR calc non Af Amer: >60 mL/min (ref >60)
Glucose, Bld: 119 mg/dL — ABNORMAL HIGH (ref 65–99)
POTASSIUM: 3.9 mmol/L (ref 3.5–5.1)
SODIUM: 136 mmol/L (ref 135–145)

## 2015-05-09 LAB — PROTIME-INR
INR: 2.14 — ABNORMAL HIGH (ref 0.00–1.49)
Prothrombin Time: 23.8 seconds — ABNORMAL HIGH (ref 11.6–15.2)

## 2015-05-09 SURGERY — DEBRIDEMENT, WOUND, STERNUM
Anesthesia: Monitor Anesthesia Care

## 2015-05-09 MED ORDER — PROPOFOL 10 MG/ML IV BOLUS
INTRAVENOUS | Status: AC
  Filled 2015-05-09: qty 20

## 2015-05-09 MED ORDER — LACTATED RINGERS IV SOLN
INTRAVENOUS | Status: DC | PRN
  Administered 2015-05-09: 11:00:00 via INTRAVENOUS

## 2015-05-09 MED ORDER — ROCURONIUM BROMIDE 50 MG/5ML IV SOLN
INTRAVENOUS | Status: AC
  Filled 2015-05-09: qty 1

## 2015-05-09 MED ORDER — PHENYLEPHRINE 40 MCG/ML (10ML) SYRINGE FOR IV PUSH (FOR BLOOD PRESSURE SUPPORT)
PREFILLED_SYRINGE | INTRAVENOUS | Status: AC
  Filled 2015-05-09: qty 10

## 2015-05-09 MED ORDER — FENTANYL CITRATE (PF) 250 MCG/5ML IJ SOLN
INTRAMUSCULAR | Status: AC
  Filled 2015-05-09: qty 5

## 2015-05-09 MED ORDER — SODIUM CHLORIDE 0.9 % IR SOLN
Status: DC | PRN
  Administered 2015-05-09: 500 mL

## 2015-05-09 MED ORDER — ACETAMINOPHEN 650 MG RE SUPP
650.0000 mg | RECTAL | Status: DC | PRN
  Filled 2015-05-09: qty 1

## 2015-05-09 MED ORDER — SUCCINYLCHOLINE CHLORIDE 20 MG/ML IJ SOLN
INTRAMUSCULAR | Status: AC
  Filled 2015-05-09: qty 1

## 2015-05-09 MED ORDER — LIDOCAINE HCL (CARDIAC) 20 MG/ML IV SOLN
INTRAVENOUS | Status: AC
  Filled 2015-05-09: qty 5

## 2015-05-09 MED ORDER — ACETAMINOPHEN 325 MG PO TABS: 650.0000 mg | ORAL_TABLET | ORAL | Status: DC | PRN

## 2015-05-09 MED ORDER — SODIUM CHLORIDE 0.9 % IR SOLN
Status: DC | PRN
  Administered 2015-05-09: 1000 mL

## 2015-05-09 MED ORDER — FENTANYL CITRATE (PF) 250 MCG/5ML IJ SOLN
INTRAMUSCULAR | Status: DC | PRN
  Administered 2015-05-09: 50 ug via INTRAVENOUS

## 2015-05-09 MED ORDER — VANCOMYCIN HCL IN DEXTROSE 1-5 GM/200ML-% IV SOLN
INTRAVENOUS | Status: AC
  Filled 2015-05-09: qty 200

## 2015-05-09 MED ORDER — MIDAZOLAM HCL 2 MG/2ML IJ SOLN
INTRAMUSCULAR | Status: AC
  Filled 2015-05-09: qty 4

## 2015-05-09 MED ORDER — ALTEPLASE 2 MG IJ SOLR
2.0000 mg | Freq: Once | INTRAMUSCULAR | Status: AC
  Administered 2015-05-09: 2 mg
  Filled 2015-05-09: qty 2

## 2015-05-09 MED ORDER — WARFARIN SODIUM 1 MG PO TABS
1.0000 mg | ORAL_TABLET | Freq: Every day | ORAL | Status: DC
  Administered 2015-05-09: 1 mg via ORAL
  Filled 2015-05-09 (×2): qty 1

## 2015-05-09 MED ORDER — PROPOFOL INFUSION 10 MG/ML OPTIME
INTRAVENOUS | Status: DC | PRN
  Administered 2015-05-09: 50 ug/kg/min via INTRAVENOUS

## 2015-05-09 MED ORDER — MIDAZOLAM HCL 5 MG/5ML IJ SOLN
INTRAMUSCULAR | Status: DC | PRN
  Administered 2015-05-09: 1 mg via INTRAVENOUS

## 2015-05-09 SURGICAL SUPPLY — 56 items
ATTRACTOMAT 16X20 MAGNETIC DRP (DRAPES) ×2 IMPLANT
BAG DECANTER FOR FLEXI CONT (MISCELLANEOUS) ×2 IMPLANT
BENZOIN TINCTURE PRP APPL 2/3 (GAUZE/BANDAGES/DRESSINGS) IMPLANT
BLADE SURG 10 STRL SS (BLADE) ×4 IMPLANT
BNDG GAUZE ELAST 4 BULKY (GAUZE/BANDAGES/DRESSINGS) IMPLANT
CANISTER SUCTION 2500CC (MISCELLANEOUS) ×2 IMPLANT
CATH THORACIC 28FR RT ANG (CATHETERS) IMPLANT
CATH THORACIC 36FR (CATHETERS) IMPLANT
CATH THORACIC 36FR RT ANG (CATHETERS) IMPLANT
CLIP TI WIDE RED SMALL 24 (CLIP) IMPLANT
CONN Y 3/8X3/8X3/8  BEN (MISCELLANEOUS) ×1
CONN Y 3/8X3/8X3/8 BEN (MISCELLANEOUS) ×1 IMPLANT
CONT SPEC 4OZ CLIKSEAL STRL BL (MISCELLANEOUS) IMPLANT
DRAPE LAPAROSCOPIC ABDOMINAL (DRAPES) ×2 IMPLANT
DRSG PAD ABDOMINAL 8X10 ST (GAUZE/BANDAGES/DRESSINGS) ×6 IMPLANT
DRSG VAC ATS SM SENSATRAC (GAUZE/BANDAGES/DRESSINGS) ×2 IMPLANT
ELECT REM PT RETURN 9FT ADLT (ELECTROSURGICAL) ×2
ELECTRODE REM PT RTRN 9FT ADLT (ELECTROSURGICAL) ×1 IMPLANT
GAUZE SPONGE 4X4 12PLY STRL (GAUZE/BANDAGES/DRESSINGS) ×2 IMPLANT
GAUZE XEROFORM 5X9 LF (GAUZE/BANDAGES/DRESSINGS) IMPLANT
GOWN STRL REUS W/ TWL LRG LVL3 (GOWN DISPOSABLE) ×4 IMPLANT
GOWN STRL REUS W/TWL LRG LVL3 (GOWN DISPOSABLE) ×4
HANDPIECE INTERPULSE COAX TIP (DISPOSABLE)
HEMOSTAT POWDER SURGIFOAM 1G (HEMOSTASIS) IMPLANT
HEMOSTAT SURGICEL 2X14 (HEMOSTASIS) ×2 IMPLANT
KIT BASIN OR (CUSTOM PROCEDURE TRAY) ×2 IMPLANT
KIT ROOM TURNOVER OR (KITS) ×2 IMPLANT
KIT SUCTION CATH 14FR (SUCTIONS) IMPLANT
NS IRRIG 1000ML POUR BTL (IV SOLUTION) ×2 IMPLANT
PACK CHEST (CUSTOM PROCEDURE TRAY) ×2 IMPLANT
PAD ARMBOARD 7.5X6 YLW CONV (MISCELLANEOUS) ×4 IMPLANT
PIN SAFETY STERILE (MISCELLANEOUS) IMPLANT
RUBBERBAND STERILE (MISCELLANEOUS) IMPLANT
SET HNDPC FAN SPRY TIP SCT (DISPOSABLE) IMPLANT
SOLUTION BETADINE 4OZ (MISCELLANEOUS) IMPLANT
SPONGE GAUZE 4X4 12PLY STER LF (GAUZE/BANDAGES/DRESSINGS) ×2 IMPLANT
SPONGE LAP 18X18 X RAY DECT (DISPOSABLE) ×2 IMPLANT
STAPLER VISISTAT 35W (STAPLE) IMPLANT
STRAP MONTGOMERY 1.25X11-1/8 (MISCELLANEOUS) IMPLANT
SUT ETHILON 3 0 FSL (SUTURE) IMPLANT
SUT MNCRL AB 4-0 PS2 18 (SUTURE) IMPLANT
SUT PDS AB 1 CTX 36 (SUTURE) ×4 IMPLANT
SUT STEEL 6MS V (SUTURE) IMPLANT
SUT STEEL STERNAL CCS#1 18IN (SUTURE) IMPLANT
SUT STEEL SZ 6 DBL 3X14 BALL (SUTURE) IMPLANT
SUT VIC AB 1 CTX 36 (SUTURE)
SUT VIC AB 1 CTX36XBRD ANBCTR (SUTURE) IMPLANT
SUT VIC AB 2-0 CTX 27 (SUTURE) IMPLANT
SUT VIC AB 3-0 X1 27 (SUTURE) IMPLANT
SWAB COLLECTION DEVICE MRSA (MISCELLANEOUS) IMPLANT
SYSTEM SAHARA CHEST DRAIN ATS (WOUND CARE) ×2 IMPLANT
TAPE CLOTH SURG 4X10 WHT LF (GAUZE/BANDAGES/DRESSINGS) ×2 IMPLANT
TOWEL OR 17X24 6PK STRL BLUE (TOWEL DISPOSABLE) ×2 IMPLANT
TOWEL OR 17X26 10 PK STRL BLUE (TOWEL DISPOSABLE) ×2 IMPLANT
TUBE ANAEROBIC SPECIMEN COL (MISCELLANEOUS) IMPLANT
WATER STERILE IRR 1000ML POUR (IV SOLUTION) ×2 IMPLANT

## 2015-05-09 NOTE — Op Note (Signed)
CARDIOTHORACIC SURGERY OPERATIVE NOTE  Date of Procedure:   05/09/2015  Preoperative Diagnosis:  Superficial Sternal Wound Infection  Postoperative Diagnosis:  same  Procedure:      Excisional Sternal Wound Debridement  Placement of Wound Vacuum Assist Closure Device  Surgeon:    Valentina Gu. Roxy Manns, MD  Assistant:    Ara Kussmaul, CRNFA  Anesthesia:    Monitored Anesthesia Care  Operative Findings:   Minimal fat necrosis and purulent drainage involving inferior aspect of sternal wound     BRIEF CLINICAL NOTE AND INDICATIONS FOR SURGERY  Patient is a 80 year old female who underwent mitral valve replacement, tricuspid valve repair, coronary artery bypass grafting 1 Thomas and Maze procedure on 04/25/2015 for rheumatic heart disease with severe symptomatic mitral regurgitation, moderate to severe tricuspid regurgitation, coronary artery disease, and recurrent persistent atrial fibrillation.  The patient's early postoperative recovery was slow but otherwise uncomplicated. She developed purulent drainage from the inferior aspect of her sternal incision without any systemic signs of infection. The incision was opened a small amount at the bedside and local wound care was initiated.  Swab cultures were obtained but have remain no growth. The patient has been treated with an robotic's. Despite this purulent drainage has continued and the patient's white blood count has been noted to gradually increase. The patient has otherwise remained clinically stable without fever or other signs of infection. The patient is subsequently brought to the operating room for more definitive surgical debridement. The patient and her husband have been counseled regarding indications, risks, and potential benefits. The patient provided full informed consent for the procedure as described.     DETAILS OF THE OPERATIVE PROCEDURE  The patient is brought to the operating room on the above mentioned date and placed  in the supine position on the operative table. Intravenous sedation is administered by the anesthesia team under the care and direction of Dr. Linna Caprice. The patient's pre-existing wound dressing is removed and the patient's anterior chest is prepared and draped in a sterile manner.  Scissors are utilized to cut the skin sutures and the subcutaneous sutures in the midportion of the inferior aspect of the sternal incision. The wound is probed and a small amount of purulent drainage is identified. Swab cultures are sent for both routine aerobic and anaerobic culture. Some of the deep fascial sutures are opened. There is minimal fat necrosis encountered. The wound is irrigated using the pulse lavage saline device. The wound is subsequently irrigated with anabiotic solution. A small wound vacuum-assisted closure sponge is trimmed to an appropriate size and placed in the wound. Negative pressure wound therapy is initiated. The patient tolerated the procedure well and was transported directly to the postanesthesia care unit in stable condition.     Valentina Gu. Roxy Manns MD 05/09/2015 11:44 AM

## 2015-05-09 NOTE — Progress Notes (Signed)
[address]       [place] [address]             [phone]     CARDIOTHORACIC SURGERY PROGRESS NOTE  Day of Surgery  S/P Procedure(s) (LRB): STERNAL WOUND DEBRIDEMENT (N/A) APPLICATION OF WOUND VAC (N/A)  Subjective: Feels well.  No complaints.  Minimal discomfort in chest.  Objective: Vital signs in last 24 hours: Temp:  [97.7 F (36.5 C)-97.8 F (36.6 C)] 97.8 F (36.6 C) (08/30 1230) Pulse Rate:  [70-74] 70 (08/30 1230) Cardiac Rhythm:  [-] Ventricular paced (08/30 1230) Resp:  [12-19] 17 (08/30 1230) BP: (128-142)/(51-85) 128/53 mmHg (08/30 1225) SpO2:  [96 %-100 %] 100 % (08/30 1230) Weight:  [66.316 kg (146 lb 3.2 oz)] 66.316 kg (146 lb 3.2 oz) (08/30 0522)  Physical Exam:  Rhythm:   paced  Breath sounds: Diminished at bases  Heart sounds:  RRR  Incisions:  Wound VAC intact  Abdomen:  Soft, non-distended, non-tender  Extremities:  Warm, well-perfused, + LE edema   Intake/Output from previous day: 08/29 0701 - 08/30 0700 In: 1040 [P.O.:1040] Out: 2840 [Urine:2840] Intake/Output this shift: Total I/O In: 690 [P.O.:480; I.V.:210] Out: 1405 [Urine:1400; Blood:5]  Lab Results:  Recent Labs  05/07/15 0506 05/09/15 0510  WBC 18.2* 16.4*  HGB 8.3* 8.3*  HCT 26.6* 27.3*  PLT 324 364   BMET:  Recent Labs  05/09/15 0510  NA 136  K 3.9  CL 95*  CO2 35*  GLUCOSE 119*  BUN 9  CREATININE 0.65  CALCIUM 8.7*    CBG (last 3)  No results for input(s): GLUCAP in the last 72 hours. PT/INR:   Recent Labs  05/09/15 0510  LABPROT 23.8*  INR 2.14*     CT CHEST WITHOUT CONTRAST  TECHNIQUE: Multidetector CT imaging of the chest was performed following the standard protocol without IV contrast.  COMPARISON: 03/17/2015  FINDINGS: Mediastinum: Previous median sternotomy and CABG procedure. The sternotomy wires are intact. The right and left sternal components are closely approximated to each other. There is no fluid  collection identified within the subcutaneous fat between the skin surface and sternum. A small, linear fluid collection within the anterior mediastinal fat which measures 1.1 x 1.3 x 4.1 cm, which is nonspecific in the early postoperative time frame. Aortic atherosclerosis noted. The heart size is moderately enlarged. Small amount of pericardial fluid is identified, nonspecific. No mediastinal or hilar adenopathy. Calcified right hilar and sub- carinal lymph nodes identified.  Lungs/Pleura: Small pleural effusions are identified right greater than left. There is subsegmental atelectasis noted within the lower lobes. Calcified granulomas noted within the right upper lobe. Mild changes of centrilobular emphysema identified.  Upper Abdomen: The visualized liver is normal. Calcified granulomas identified. Normal appearance of the adrenal glands.  Musculoskeletal: Review of the visualized osseous structures is significant for mild thoracic spondylosis.  IMPRESSION: 1. Expected postoperative changes associated with recent CABG procedure. 2. No subcutaneous fluid collections identified overlying the sternotomy defect. The components of the sternotomy defect appear well approximated and there is no evidence for sternal wire fracture. 3. A small low-attenuation fluid collection is identified within the anterior mediastinum/retrosternal space. This is nonspecific finding in the early postoperative time frame. 4. Small pleural effusions. 5. Prior granulomatous disease.   Electronically Signed  By: Kerby Moors M.D.  On: 05/08/2015 12:18  Assessment/Plan: S/P Procedure(s) (LRB): STERNAL WOUND DEBRIDEMENT (N/A) APPLICATION OF WOUND VAC (N/A)  All cultures remain no growth so  far.  Repeat cultures sent from OR.  No evidence of deep sternal infection or mediastinitis.  Will recheck WBC tomorrow and reexamine wound on Thursday morning.  If leukocytosis resolves, cultures  remain negative, and wound looks good will start making arrangements for d/c to SNF with wound VAC.  [name] 05/09/2015 6:49 PM

## 2015-05-09 NOTE — Transfer of Care (Signed)
Immediate Anesthesia Transfer of Care Note  Patient: [name]  Procedure(s) Performed: Procedure(s): STERNAL WOUND DEBRIDEMENT (N/A) APPLICATION OF WOUND VAC (N/A)  Patient Location: PACU  Anesthesia Type:MAC  Level of Consciousness: awake, alert , oriented and patient cooperative  Airway & Oxygen Therapy: Patient Spontanous Breathing and Patient connected to nasal cannula oxygen  Post-op Assessment: Report given to RN, Post -op Vital signs reviewed and stable and Patient moving all extremities  Post vital signs: Reviewed and stable  Last Vitals:  Filed Vitals:   05/09/15 0522  BP: 142/85  Pulse: 72  Temp: 36.5 C  Resp: 19    Complications: No apparent anesthesia complications

## 2015-05-09 NOTE — Progress Notes (Signed)
ANTIBIOTIC CONSULT NOTE - FOLLOW UP  Pharmacy Consult for vancomycin Indication: sternal wound  Allergies  Allergen Reactions  . Augmentin [Amoxicillin-Pot Clavulanate] Swelling    Swollen joints, pain  . Nifedipine     Other reaction(s): Unknown  . Penicillins     Other reaction(s): Other (See Comments) Swollen joints  . Propranolol     Other reaction(s): Unknown  . Ace Inhibitors     Other reaction(s): Cough  . Diazepam     Other reaction(s): Other (See Comments) Made her "hyper"  . Meperidine     Other reaction(s): Other (See Comments) Made her "hyper"  . Propoxyphene     Other reaction(s): Other (See Comments) Made her "hyper"    Patient Measurements: Height: 5\' 2"  (157.5 cm) Weight: 146 lb 3.2 oz (66.316 kg) IBW/kg (Calculated) : 50.1  Vital Signs: Temp: 97.8 F (36.6 C) (08/30 1230) Temp Source: Oral (08/30 0522) BP: 128/53 mmHg (08/30 1225) Pulse Rate: 70 (08/30 1230) Intake/Output from previous day: 08/29 0701 - 08/30 0700 In: 1040 [P.O.:1040] Out: 2840 [Urine:2840] Intake/Output from this shift: Total I/O In: 450 [P.O.:240; I.V.:210] Out: 1005 [Urine:1000; Blood:5]  Labs:  Recent Labs  05/07/15 0506 05/09/15 0510  WBC 18.2* 16.4*  HGB 8.3* 8.3*  PLT 324 364  CREATININE  --  0.65   Estimated Creatinine Clearance: 51.8 mL/min (by C-G formula based on Cr of 0.65). No results for input(s): VANCOTROUGH, VANCOPEAK, VANCORANDOM, GENTTROUGH, GENTPEAK, GENTRANDOM, TOBRATROUGH, TOBRAPEAK, TOBRARND, AMIKACINPEAK, AMIKACINTROU, AMIKACIN in the last 72 hours.   Microbiology: Recent Results (from the past 720 hour(s))  Surgical pcr screen     Status: None   Collection Time: 04/21/15 12:43 PM  Result Value Ref Range Status   MRSA, PCR NEGATIVE NEGATIVE Final   Staphylococcus aureus NEGATIVE NEGATIVE Final    Comment:        The Xpert SA Assay (FDA approved for NASAL specimens in patients over 81 years of age), is one component of a comprehensive  surveillance program.  Test performance has been validated by [facility] for patients greater than or equal to 33 year old. It is not intended to diagnose infection nor to guide or monitor treatment.   Wound culture     Status: None   Collection Time: 05/04/15 11:20 AM  Result Value Ref Range Status   Specimen Description WOUND STERNUM  Final   Special Requests Normal  Final   Gram Stain   Final    RARE WBC PRESENT, PREDOMINANTLY PMN NO SQUAMOUS EPITHELIAL CELLS SEEN NO ORGANISMS SEEN Performed at [facility]    Culture   Final    NO GROWTH 2 DAYS Performed at [facility]    Report Status 05/07/2015 FINAL  Final  Wound culture     Status: None   Collection Time: 05/06/15 12:31 PM  Result Value Ref Range Status   Specimen Description WOUND CHEST  Final   Special Requests Normal  Final   Gram Stain   Final    RARE WBC PRESENT, PREDOMINANTLY PMN NO SQUAMOUS EPITHELIAL CELLS SEEN NO ORGANISMS SEEN Performed at [facility]    Culture   Final    NO GROWTH 2 DAYS Performed at [facility]    Report Status 05/08/2015 FINAL  Final  Wound culture     Status: None (Preliminary result)   Collection Time: 05/07/15  8:17 AM  Result Value Ref Range Status   Specimen Description WOUND CHEST  Final   Special Requests NONE  Final   Gram Stain   Final    FEW WBC PRESENT, PREDOMINANTLY PMN RARE SQUAMOUS EPITHELIAL CELLS PRESENT NO ORGANISMS SEEN Performed at [facility]    Culture   Final    NO GROWTH 1 DAY Performed at [facility]    Report Status PENDING  Incomplete  Wound culture     Status: None (Preliminary result)   Collection Time: 05/08/15  3:28 PM  Result Value Ref Range Status   Specimen Description WOUND STERNUM  Final   Special Requests NONE  Final   Gram Stain   Final    RARE WBC PRESENT, PREDOMINANTLY PMN NO SQUAMOUS EPITHELIAL CELLS SEEN NO ORGANISMS SEEN Performed at [facility]    Culture  PENDING  Incomplete   Report Status PENDING  Incomplete    Anti-infectives    Start     Dose/Rate Route Frequency Ordered Stop   05/09/15 1135  polymyxin B 500,000 Units, bacitracin 50,000 Units in sodium chloride irrigation 0.9 % 500 mL irrigation  Status:  Discontinued       As needed 05/09/15 1136 05/09/15 1207   05/07/15 1230  vancomycin (VANCOCIN) 1,500 mg in sodium chloride 0.9 % 500 mL IVPB     1,500 mg 250 mL/hr over 120 Minutes Intravenous Every 24 hours 05/06/15 1445     05/06/15 1230  ciprofloxacin (CIPRO) tablet 500 mg     500 mg Oral 2 times daily 05/06/15 1219     05/04/15 1230  vancomycin (VANCOCIN) 1,250 mg in sodium chloride 0.9 % 250 mL IVPB  Status:  Discontinued     1,250 mg 166.7 mL/hr over 90 Minutes Intravenous Every 24 hours 05/04/15 1225 05/06/15 1445   05/04/15 0845  vancomycin (VANCOCIN) IVPB 1000 mg/200 mL premix  Status:  Discontinued     1,000 mg 200 mL/hr over 60 Minutes Intravenous  Once 05/04/15 0832 05/04/15 1219   05/03/15 0500  vancomycin (VANCOCIN) IVPB 1000 mg/200 mL premix     1,000 mg 200 mL/hr over 60 Minutes Intravenous Every 12 hours 05/02/15 2202 05/03/15 0605   05/02/15 0800  gentamicin (GARAMYCIN) 80 mg in sodium chloride irrigation 0.9 % 500 mL irrigation     80 mg Irrigation To Cath Lab 05/01/15 1826 05/02/15 1841   05/02/15 0800  vancomycin (VANCOCIN) IVPB 1000 mg/200 mL premix     1,000 mg 200 mL/hr over 60 Minutes Intravenous To Cath Lab 05/01/15 1826 05/02/15 1815   04/26/15 1845  vancomycin (VANCOCIN) IVPB 1000 mg/200 mL premix  Status:  Discontinued     1,000 mg 200 mL/hr over 60 Minutes Intravenous  Once 04/25/15 1629 04/25/15 1700   04/26/15 0600  levofloxacin (LEVAQUIN) IVPB 750 mg     750 mg 100 mL/hr over 90 Minutes Intravenous Every 24 hours 04/25/15 1629 04/26/15 0653   04/25/15 1845  vancomycin (VANCOCIN) IVPB 1000 mg/200 mL premix     1,000 mg 200 mL/hr over 60 Minutes Intravenous  Once 04/25/15 1700 04/25/15 1906    04/25/15 0400  vancomycin (VANCOCIN) 1,250 mg in sodium chloride 0.9 % 250 mL IVPB     1,250 mg 166.7 mL/hr over 90 Minutes Intravenous To Surgery 04/24/15 1426 04/25/15 0848   04/25/15 0400  levofloxacin (LEVAQUIN) IVPB 500 mg     500 mg 100 mL/hr over 60 Minutes Intravenous To Surgery 04/24/15 1426 04/25/15 0848   04/25/15 0400  vancomycin (VANCOCIN) 1,000 mg in sodium chloride 0.9 % 1,000 mL irrigation  Irrigation To Surgery 04/24/15 1426 04/25/15 0829      Assessment: 80 year old female s/p open heart surgery 8/16 with sternal wound on vancomycin Day #6, Cipro PO day#4.Marland Kitchen Sternal wound continued with purulent drainage thus patient to OR today and she is now s/p debridement of sternal wound per TCTS today 05/09/15.  Current abx's, IV vancomcycin and oral cipro continue.  Last vancomycin trough (9 mcg/ml) was checked on 8/27. Vanc dose was increased at that time to 1500 mg IV q24h.   SCr is stable at 0.65 and CrCL ~ 50 ml/min. I/O 1040/2840, UOP 1.8 ml/kg/hr.  WBC = 16.4, afebrile.    Vancomycin 8/25>   -VT 9 on 8/27 Cipro 8/27>>  8/25: wound Cx> negative 8/27 wound Cx negative 8/29 Wound Cx -pending  Goal of Therapy:  Vancomycin trough level 10-15 mcg/ml  Plan:  Continue Vancomycin 1500mg  IV q24hr -Will follow renal function, cultures and clinical progress -Recheck vancomycin trough tomorrow 8/31.   Nicole Cella, RPh Clinical Pharmacist Pager: [phone]  05/09/2015 2:55 PM

## 2015-05-10 ENCOUNTER — Encounter (HOSPITAL_COMMUNITY): Payer: Self-pay | Admitting: Thoracic Surgery (Cardiothoracic Vascular Surgery)

## 2015-05-10 LAB — CBC
HCT: 26.9 % — ABNORMAL LOW (ref 36.0–46.0)
Hemoglobin: 8.3 g/dL — ABNORMAL LOW (ref 12.0–15.0)
MCH: 30.3 pg (ref 26.0–34.0)
MCHC: 30.9 g/dL (ref 30.0–36.0)
MCV: 98.2 fL (ref 78.0–100.0)
PLATELETS: 354 10*3/uL (ref 150–400)
RBC: 2.74 MIL/uL — ABNORMAL LOW (ref 3.87–5.11)
RDW: 18.3 % — AB (ref 11.5–15.5)
WBC: 14.3 10*3/uL — ABNORMAL HIGH (ref 4.0–10.5)

## 2015-05-10 LAB — BASIC METABOLIC PANEL
Anion gap: 7 (ref 5–15)
BUN: 8 mg/dL (ref 6–20)
CO2: 37 mmol/L — ABNORMAL HIGH (ref 22–32)
CREATININE: 0.73 mg/dL (ref 0.44–1.00)
Calcium: 8.9 mg/dL (ref 8.9–10.3)
Chloride: 94 mmol/L — ABNORMAL LOW (ref 101–111)
GFR calc Af Amer: >60 mL/min (ref >60)
GLUCOSE: 123 mg/dL — AB (ref 65–99)
Potassium: 4.1 mmol/L (ref 3.5–5.1)
SODIUM: 138 mmol/L (ref 135–145)

## 2015-05-10 LAB — PROTIME-INR
INR: 1.91 — ABNORMAL HIGH (ref 0.00–1.49)
PROTHROMBIN TIME: 21.8 s — AB (ref 11.6–15.2)

## 2015-05-10 LAB — VANCOMYCIN, TROUGH: VANCOMYCIN TR: 15 ug/mL (ref 10.0–20.0)

## 2015-05-10 MED ORDER — FUROSEMIDE 40 MG PO TABS
40.0000 mg | ORAL_TABLET | Freq: Every day | ORAL | Status: DC
  Administered 2015-05-10: 40 mg via ORAL

## 2015-05-10 MED ORDER — WARFARIN SODIUM 2 MG PO TABS
2.0000 mg | ORAL_TABLET | Freq: Every day | ORAL | Status: DC
  Administered 2015-05-10: 2 mg via ORAL
  Filled 2015-05-10: qty 1
  Filled 2015-05-10: qty 2
  Filled 2015-05-10: qty 1

## 2015-05-10 MED ORDER — POTASSIUM CHLORIDE CRYS ER 20 MEQ PO TBCR
20.0000 meq | EXTENDED_RELEASE_TABLET | Freq: Every day | ORAL | Status: DC
  Administered 2015-05-10 – 2015-05-11 (×2): 20 meq via ORAL
  Filled 2015-05-10: qty 1

## 2015-05-10 MED ORDER — FUROSEMIDE 40 MG PO TABS
40.0000 mg | ORAL_TABLET | Freq: Every day | ORAL | Status: DC
  Administered 2015-05-10 – 2015-05-11 (×2): 40 mg via ORAL
  Filled 2015-05-10: qty 1

## 2015-05-10 MED ORDER — FUROSEMIDE 40 MG PO TABS: 40.0000 mg | ORAL_TABLET | Freq: Two times a day (BID) | ORAL | Status: DC

## 2015-05-10 NOTE — Progress Notes (Signed)
Pt received insurance auth on 8/30- auth will be good for 48 hours (so through 9/1)- CSW informed facility about wound vac  CSW faxed updated clinicals to facility and will continue to follow- anticipated 9/1 DC if wound looks good and patient is stable  Domenica Reamer, [place] Social Worker [phone]

## 2015-05-10 NOTE — Progress Notes (Addendum)
Occupational Therapy Treatment Patient Details Name: [name] MRN: [id number] DOB: 1935/07/03 Today's Date: 05/10/2015    History of present illness Admitted 04/25/15 for MVR, tricuspid valve repair, CABG x 1, and clipping of atrial appendage. Post-op hypotension requiring pressors and N/V PMHx- COPD (home O2), CHF, Menieres, HTN, Rt breast Ca; bil cochlear implants. Pt is now s/p PPM implant 05/02/15. Now s/p sternal wound debridement and placement of wound vac.   OT comments  Education provided in session. Pt progressing toward goals. Spouse wanting pt to go to SNF for rehab-discussed with him and also that pt is doing well. OT talked to Education officer, museum.    Follow Up Recommendations  Home health OT;Supervision/Assistance - 24 hour   Equipment Recommendations  3 in 1 bedside comode    Recommendations for Other Services      Precautions / Restrictions Precautions Precautions: Sternal;Fall;ICD/Pacemaker Precaution Comments: educated/reviewed sternal precautions Restrictions Weight Bearing Restrictions:  (sternal precautions)       Mobility Bed Mobility               General bed mobility comments: not assessed-educated on technique.   Transfers Overall transfer level: Needs assistance   Transfers: Sit to/from Stand Sit to Stand: Min guard         General transfer comment: cues for technique.    Balance        No LOB in session. Used RW for ambulation and upon sit to stand.                            ADL Overall ADL's : Needs assistance/impaired     Grooming: Brushing hair;Standing;Set up;Supervision/safety (applied chapstick)                   Toilet Transfer: Min guard;Ambulation;RW (sit to stand from chair )           Functional mobility during ADLs: Min guard;Rolling walker   Comments: Suggested using 3 in 1 if pt goes home, for night time. Spouse reports he has access to one.      Vision                      Perception     Praxis      Cognition  Awake/Alert Behavior During Therapy: WFL for tasks assessed/performed Overall Cognitive Status: Within Functional Limits for tasks assessed                       Extremity/Trunk Assessment               Exercises     Shoulder Instructions       General Comments      Pertinent Vitals/ Pain       Pain Assessment: No/denies pain  Home Living                                          Prior Functioning/Environment              Frequency Min 2X/week     Progress Toward Goals  OT Goals(current goals can now be found in the care plan section)  Progress towards OT goals: Progressing toward goals  Acute Rehab OT Goals Patient Stated Goal: not stated OT Goal Formulation: With patient Time For Goal Achievement: 05/12/15 Potential  to Achieve Goals: Good ADL Goals Pt Will Perform Grooming: with supervision;standing Pt Will Perform Upper Body Bathing: sitting;with supervision Pt Will Perform Lower Body Bathing: with min assist;sit to/from stand Pt Will Perform Upper Body Dressing: with supervision;sitting Pt Will Perform Lower Body Dressing: with min assist;sit to/from stand Pt Will Transfer to Toilet: with supervision;ambulating;bedside commode Pt Will Perform Toileting - Clothing Manipulation and hygiene: with supervision;sit to/from stand Pt Will Perform Tub/Shower Transfer: Shower transfer;with min guard assist;ambulating;3 in 1;rolling walker;grab bars Additional ADL Goal #1: Pt will generalize sternal precautions in mobility and ADL. Additional ADL Goal #2: Pt will employ energy conservation strategies in ADL and mobility with min verbal cues.  Plan Discharge plan needs to be updated    Co-evaluation                 End of Session Equipment Utilized During Treatment: Oxygen;Gait belt;Rolling walker   Activity Tolerance Patient tolerated treatment well   Patient Left in chair;with  family/visitor present   Nurse Communication          Time: 6759-1638 OT Time Calculation (min): 21 min  Charges: OT General Charges $OT Visit: 1 Procedure OT Treatments $Therapeutic Activity: 8-22 mins  Benito Mccreedy OTR/L [phone] 05/10/2015, 11:18 AM

## 2015-05-10 NOTE — Progress Notes (Signed)
ANTIBIOTIC CONSULT NOTE - FOLLOW UP  Pharmacy Consult for vancomycin Indication: sternal wound  Allergies  Allergen Reactions  . Augmentin [Amoxicillin-Pot Clavulanate] Swelling    Swollen joints, pain  . Nifedipine     Other reaction(s): Unknown  . Penicillins     Other reaction(s): Other (See Comments) Swollen joints  . Propranolol     Other reaction(s): Unknown  . Ace Inhibitors     Other reaction(s): Cough  . Diazepam     Other reaction(s): Other (See Comments) Made her "hyper"  . Meperidine     Other reaction(s): Other (See Comments) Made her "hyper"  . Propoxyphene     Other reaction(s): Other (See Comments) Made her "hyper"    Patient Measurements: Height: 5\' 2"  (157.5 cm) Weight: 146 lb 3.2 oz (66.316 kg) IBW/kg (Calculated) : 50.1  Vital Signs: Temp: 97.8 F (36.6 C) (08/31 1341) Temp Source: Oral (08/31 1341) BP: 123/67 mmHg (08/31 1341) Pulse Rate: 75 (08/31 1341) Intake/Output from previous day: 08/30 0701 - 08/31 0700 In: 940 [P.O.:730; I.V.:210] Out: 3405 [Urine:3400; Blood:5] Intake/Output from this shift: Total I/O In: 480 [P.O.:480] Out: 1200 [Urine:1200]  Labs:  Recent Labs  05/09/15 0510 05/10/15 0515  WBC 16.4* 14.3*  HGB 8.3* 8.3*  PLT 364 354  CREATININE 0.65 0.73   Estimated Creatinine Clearance: 51.8 mL/min (by C-G formula based on Cr of 0.73).  Recent Labs  05/10/15 1235  [place] 15     Microbiology: Recent Results (from the past 720 hour(s))  Surgical pcr screen     Status: None   Collection Time: 04/21/15 12:43 PM  Result Value Ref Range Status   MRSA, PCR NEGATIVE NEGATIVE Final   Staphylococcus aureus NEGATIVE NEGATIVE Final    Comment:        The Xpert SA Assay (FDA approved for NASAL specimens in patients over 56 years of age), is one component of a comprehensive surveillance program.  Test performance has been validated by [facility] for patients greater than or equal to 30 year old. It is not  intended to diagnose infection nor to guide or monitor treatment.   Wound culture     Status: None   Collection Time: 05/04/15 11:20 AM  Result Value Ref Range Status   Specimen Description WOUND STERNUM  Final   Special Requests Normal  Final   Gram Stain   Final    RARE WBC PRESENT, PREDOMINANTLY PMN NO SQUAMOUS EPITHELIAL CELLS SEEN NO ORGANISMS SEEN Performed at [facility]    Culture   Final    NO GROWTH 2 DAYS Performed at [facility]    Report Status 05/07/2015 FINAL  Final  Wound culture     Status: None   Collection Time: 05/06/15 12:31 PM  Result Value Ref Range Status   Specimen Description WOUND CHEST  Final   Special Requests Normal  Final   Gram Stain   Final    RARE WBC PRESENT, PREDOMINANTLY PMN NO SQUAMOUS EPITHELIAL CELLS SEEN NO ORGANISMS SEEN Performed at [facility]    Culture   Final    NO GROWTH 2 DAYS Performed at [facility]    Report Status 05/08/2015 FINAL  Final  Wound culture     Status: None (Preliminary result)   Collection Time: 05/07/15  8:17 AM  Result Value Ref Range Status   Specimen Description WOUND CHEST  Final   Special Requests NONE  Final   Gram Stain   Final  FEW WBC PRESENT, PREDOMINANTLY PMN RARE SQUAMOUS EPITHELIAL CELLS PRESENT NO ORGANISMS SEEN Performed at [facility]    Culture   Final    NO GROWTH 2 DAYS Performed at [facility]    Report Status PENDING  Incomplete  Wound culture     Status: None (Preliminary result)   Collection Time: 05/08/15  3:28 PM  Result Value Ref Range Status   Specimen Description WOUND STERNUM  Final   Special Requests NONE  Final   Gram Stain   Final    RARE WBC PRESENT, PREDOMINANTLY PMN NO SQUAMOUS EPITHELIAL CELLS SEEN NO ORGANISMS SEEN Performed at [facility]    Culture   Final    Culture reincubated for better growth Performed at [facility]    Report Status PENDING  Incomplete  Anaerobic  culture     Status: None (Preliminary result)   Collection Time: 05/09/15 11:37 AM  Result Value Ref Range Status   Specimen Description TISSUE  Final   Special Requests STERNAL WOUND DRAINAGE   Final   Gram Stain   Final    NO WBC SEEN NO ORGANISMS SEEN Performed at [facility]    Culture   Final    NO ANAEROBES ISOLATED; CULTURE IN PROGRESS FOR 5 DAYS Performed at [facility]    Report Status PENDING  Incomplete  Tissue culture     Status: None (Preliminary result)   Collection Time: 05/09/15 11:37 AM  Result Value Ref Range Status   Specimen Description TISSUE  Final   Special Requests STERNAL WOUND DRAINAGE   Final   Gram Stain   Final    NO WBC SEEN NO ORGANISMS SEEN Performed at [facility]    Culture   Final    NO GROWTH 1 DAY Performed at [facility]    Report Status PENDING  Incomplete     Assessment: 80 year old female s/p open heart surgery 8/16 with sternal wound on vancomycin. WBC= 14.3, afebrile, SCr= 0.73 and CrCL ~ 50. Vancomycin trough today is 15 which is therapeutic. Cipro added per MD on 8/27. s/p wound VAC placement.  All culture data negative.   Vancomycin 8/25> >  -VT 9 on 8/27 dose incr to 1500 q24 from 1250 q24    - VT 15 on 8/31 - continue 1500 q24 Cipro 8/27>>  Wound Cx 8/25> ngtd Wound Cx 8/27>ngtd Sternal wound drainage 8/30>>ngtd  Goal of Therapy:  Vancomycin trough level 10-15 mcg/ml  Plan:  -Continue vancomycin 1500mg  IV q24hr -continues on cipro 500 mg PO bid - TCTS considering narrowing abxs -Will follow renal function, cultures and clinical progress  Eudelia Bunch, Pharm.D. [phone] 05/10/2015 2:55 PM

## 2015-05-10 NOTE — Progress Notes (Signed)
Physical Therapy Treatment Patient Details Name: [name] MRN: [id number] DOB: 07/05/1935 Today's Date: 05/10/2015    History of Present Illness Admitted 04/25/15 for MVR, tricuspid valve repair, CABG x 1, and clipping of atrial appendage. Post-op hypotension requiring pressors and N/V PMHx- COPD (home O2), CHF, Menieres, HTN, Rt breast Ca; bil cochlear implants. Pt is now s/p PPM implant 05/02/15. Now s/p sternal wound debridement and placement of wound vac.    PT Comments    Pt progressing well towards physical therapy goals. Pt continues to improve with tolerance for functional activity, and was able to ambulate 300' this session. Grossly, pt at a min guard level and does not require physical assist for transfers.   Pt's husband requesting that pt go to SNF for more rehab (he states 2 or 3 days) prior to return home. Discussed SNF vs. HHPT and that from a PT standpoint, pt is more appropriate for home health at this time. Pt's husband appeared angry at this and stated that if his wife is discharged home, he will file a complaint with the hospital. Attempted to educate the pt's husband on the low level of anticipated assist for his wife, and he continued to request SNF and threaten to file complaints. CM notified who spoke with CSW during our conversation. Will continue to follow.    Follow Up Recommendations  Home health PT;Supervision for mobility/OOB     Equipment Recommendations  Rolling walker with 5" wheels    Recommendations for Other Services       Precautions / Restrictions Precautions Precautions: Sternal;Fall;ICD/Pacemaker Precaution Comments: educated/reviewed sternal precautions; wound vac Restrictions Weight Bearing Restrictions: Yes (Sternal )    Mobility  Bed Mobility               General bed mobility comments: Pt received sitting up in the recliner chair  Transfers Overall transfer level: Needs assistance Equipment used: Rolling walker (2  wheeled) Transfers: Sit to/from Stand Sit to Stand: Min guard         General transfer comment: Close guard for safety, and only light hold on gait belt was provided to achieve full standing position. Pt was cued to maintain sternal precautions.   Ambulation/Gait Ambulation/Gait assistance: Min guard Ambulation Distance (Feet): 300 Feet Assistive device: Rolling walker (2 wheeled) Gait Pattern/deviations: Step-through pattern;Decreased stride length Gait velocity: decreased Gait velocity interpretation: Below normal speed for age/gender General Gait Details: x3 standing rest breaks due to fatigue. Pt continues to self-monitor for fatigue and voices when she needs a break. O2 sats remained ~95% on 2L/min supplemental O2 throughout.    Stairs            Wheelchair Mobility    Modified Rankin (Stroke Patients Only)       Balance Overall balance assessment: Needs assistance Sitting-balance support: Feet supported Sitting balance-Leahy Scale: Good     Standing balance support: Bilateral upper extremity supported Standing balance-Leahy Scale: Fair                      Cognition Arousal/Alertness: Awake/alert Behavior During Therapy: WFL for tasks assessed/performed Overall Cognitive Status: Within Functional Limits for tasks assessed                      Exercises General Exercises - Lower Extremity Long Arc Quad: 10 reps    General Comments        Pertinent Vitals/Pain Pain Assessment: No/denies pain    Home Living  Prior Function            PT Goals (current goals can now be found in the care plan section) Acute Rehab PT Goals Patient Stated Goal: not stated PT Goal Formulation: With patient Time For Goal Achievement: 05/12/15 Potential to Achieve Goals: Good Progress towards PT goals: Progressing toward goals    Frequency  Min 3X/week    PT Plan Current plan remains appropriate    Co-evaluation              End of Session Equipment Utilized During Treatment: Gait belt;Oxygen Activity Tolerance: Patient tolerated treatment well Patient left: in chair;with call bell/phone within reach;with nursing/sitter in room;with family/visitor present     Time: 0340-3524 PT Time Calculation (min) (ACUTE ONLY): 28 min  Charges:  $Gait Training: 8-22 mins $Therapeutic Activity: 8-22 mins                    G Codes:      Rolinda Roan 06-04-15, 3:34 PM   Rolinda Roan, PT, DPT Acute Rehabilitation Services Pager: [phone]

## 2015-05-10 NOTE — Progress Notes (Signed)
CARDIAC REHAB PHASE I   PRE:  Rate/Rhythm: 80 afib paced    BP: sitting 123/67    SaO2: 98 2L, 86 RA  MODE:  Ambulation: 300 ft   POST:  Rate/Rhythm: 90 afib    BP: sitting 123/68     SaO2: 89-90 2L  Pt able to stand and walk without assist, used RW, I pushed IV pole with O2 and wound vac. Walked one lap without rest then rested x3 on second lap.  Checked pt SaO2 on RA before walking and down to 86 RA after a few minutes. Husband now stating they are going to go home with HHPT and RN. Will need 3n1 and RW. Discussed CRPII and will send referral to [facility]. Will f/u in am with education. [phone]  [name] CES, ACSM 05/10/2015 2:19 PM

## 2015-05-10 NOTE — Progress Notes (Signed)
CSW informed that pt is now wanting to return home- CSW met with pt and husband at bedside- pt states that she is positive that she wants to return home even with wound vac- pt husband was not as sure but pt reiterated multiple times that she feels comfortable returning home.  CSW informed RNCM of pt decision- RNCM will begin process to obtain wound vac and other home health services  CSW signing off.  [name], [place] Social Worker [phone]

## 2015-05-10 NOTE — Progress Notes (Addendum)
[address]       [place] [address]             [phone]      1 Day Post-Op Procedure(s) (LRB): STERNAL WOUND DEBRIDEMENT (N/A) APPLICATION OF WOUND VAC (N/A) Subjective: Feels well, no pain  Objective: Vital signs in last 24 hours: Temp:  [97.8 F (36.6 C)-98.3 F (36.8 C)] 97.8 F (36.6 C) (08/31 0511) Pulse Rate:  [70-74] 70 (08/31 0511) Cardiac Rhythm:  [-] Ventricular paced (08/30 2030) Resp:  [12-18] 18 (08/31 0511) BP: (109-129)/(51-64) 109/64 mmHg (08/31 0511) SpO2:  [96 %-100 %] 99 % (08/31 0511)  Hemodynamic parameters for last 24 hours:    Intake/Output from previous day: 08/30 0701 - 08/31 0700 In: 940 [P.O.:730; I.V.:210] Out: 3405 [Urine:3400; Blood:5] Intake/Output this shift:    General appearance: alert, cooperative and no distress Heart: regular rate and rhythm and soft systolic murmur Lungs: clear to auscultation bilaterally Abdomen: benign Extremities: minor edema Wound: incis- vac in place, min drainage no erethema  Lab Results:  Recent Labs  05/09/15 0510 05/10/15 0515  WBC 16.4* 14.3*  HGB 8.3* 8.3*  HCT 27.3* 26.9*  PLT 364 354   BMET:  Recent Labs  05/09/15 0510 05/10/15 0515  NA 136 138  K 3.9 4.1  CL 95* 94*  CO2 35* 37*  GLUCOSE 119* 123*  BUN 9 8  CREATININE 0.65 0.73  CALCIUM 8.7* 8.9    PT/INR:  Recent Labs  05/10/15 0515  LABPROT 21.8*  INR 1.91*   ABG    Component Value Date/Time   PHART 7.391 04/26/2015 0952   HCO3 25.9* 04/26/2015 0952   TCO2 34 04/30/2015 1735   ACIDBASEDEF 3.0* 04/26/2015 0832   O2SAT 61.7 04/29/2015 0710    Recent Results (from the past 240 hour(s))  Wound culture     Status: None   Collection Time: 05/04/15 11:20 AM  Result Value Ref Range Status   Specimen Description WOUND STERNUM  Final   Special Requests Normal  Final   Gram Stain   Final    RARE WBC PRESENT, PREDOMINANTLY PMN NO SQUAMOUS EPITHELIAL CELLS SEEN NO ORGANISMS  SEEN Performed at [facility]    Culture   Final    NO GROWTH 2 DAYS Performed at [facility]    Report Status 05/07/2015 FINAL  Final  Wound culture     Status: None   Collection Time: 05/06/15 12:31 PM  Result Value Ref Range Status   Specimen Description WOUND CHEST  Final   Special Requests Normal  Final   Gram Stain   Final    RARE WBC PRESENT, PREDOMINANTLY PMN NO SQUAMOUS EPITHELIAL CELLS SEEN NO ORGANISMS SEEN Performed at [facility]    Culture   Final    NO GROWTH 2 DAYS Performed at [facility]    Report Status 05/08/2015 FINAL  Final  Wound culture     Status: None (Preliminary result)   Collection Time: 05/07/15  8:17 AM  Result Value Ref Range Status   Specimen Description WOUND CHEST  Final   Special Requests NONE  Final   Gram Stain   Final    FEW WBC PRESENT, PREDOMINANTLY PMN RARE SQUAMOUS EPITHELIAL CELLS PRESENT NO ORGANISMS SEEN Performed at [facility]    Culture   Final    NO GROWTH 1 DAY Performed at [facility]    Report Status PENDING  Incomplete  Wound  culture     Status: None (Preliminary result)   Collection Time: 05/08/15  3:28 PM  Result Value Ref Range Status   Specimen Description WOUND STERNUM  Final   Special Requests NONE  Final   Gram Stain   Final    RARE WBC PRESENT, PREDOMINANTLY PMN NO SQUAMOUS EPITHELIAL CELLS SEEN NO ORGANISMS SEEN Performed at [facility]    Culture   Final    Culture reincubated for better growth Performed at [facility]    Report Status PENDING  Incomplete  Tissue culture     Status: None (Preliminary result)   Collection Time: 05/09/15 11:37 AM  Result Value Ref Range Status   Specimen Description TISSUE  Final   Special Requests STERNAL WOUND DRAINAGE   Final   Gram Stain PENDING  Incomplete   Culture   Final    NO GROWTH 1 DAY Performed at [facility]    Report Status PENDING  Incomplete     CBG (last  3)  No results for input(s): GLUCAP in the last 72 hours.  Meds Scheduled Meds: . amiodarone  200 mg Oral BID  . aspirin EC  81 mg Oral Daily  . bisacodyl  10 mg Oral Daily   Or  . bisacodyl  10 mg Rectal Daily  . ciprofloxacin  500 mg Oral BID  . docusate sodium  200 mg Oral Daily  . ferrous sulfate  325 mg Oral Q breakfast  . folic acid  1 mg Oral Daily  . furosemide  40 mg Oral BID  . levothyroxine  88 mcg Oral QAC breakfast  . losartan  100 mg Oral Daily  . metoCLOPramide  10 mg Oral TID AC & HS  . pantoprazole  40 mg Oral Daily  . potassium chloride  20 mEq Oral BID  . sodium chloride  10-40 mL Intracatheter Q12H  . sodium chloride  3 mL Intravenous Q12H  . spironolactone  25 mg Oral Daily  . vancomycin  1,500 mg Intravenous Q24H  . warfarin  1 mg Oral q1800  . Warfarin - Physician Dosing Inpatient   Does not apply q1800   Continuous Infusions:  PRN Meds:.sodium chloride, acetaminophen **OR** acetaminophen, HYDROcodone-acetaminophen, ipratropium-albuterol, metoprolol, ondansetron (ZOFRAN) IV, oxyCODONE, sodium chloride, sodium chloride, traMADol  Xrays Ct Chest Wo Contrast  05/08/2015   CLINICAL DATA:  Status post CABG.  Surgical wound breakdown.  EXAM: CT CHEST WITHOUT CONTRAST  TECHNIQUE: Multidetector CT imaging of the chest was performed following the standard protocol without IV contrast.  COMPARISON:  03/17/2015  FINDINGS: Mediastinum: Previous median sternotomy and CABG procedure. The sternotomy wires are intact. The right and left sternal components are closely approximated to each other. There is no fluid collection identified within the subcutaneous fat between the skin surface and sternum. A small, linear fluid collection within the anterior mediastinal fat which measures 1.1 x 1.3 x 4.1 cm, which is nonspecific in the early postoperative time frame. Aortic atherosclerosis noted. The heart size is moderately enlarged. Small amount of pericardial fluid is identified,  nonspecific. No mediastinal or hilar adenopathy. Calcified right hilar and sub- carinal lymph nodes identified.  Lungs/Pleura: Small pleural effusions are identified right greater than left. There is subsegmental atelectasis noted within the lower lobes. Calcified granulomas noted within the right upper lobe. Mild changes of centrilobular emphysema identified.  Upper Abdomen: The visualized liver is normal. Calcified granulomas identified. Normal appearance of the adrenal glands.  Musculoskeletal: Review of the visualized osseous structures  is significant for mild thoracic spondylosis.  IMPRESSION: 1. Expected postoperative changes associated with recent CABG procedure. 2. No subcutaneous fluid collections identified overlying the sternotomy defect. The components of the sternotomy defect appear well approximated and there is no evidence for sternal wire fracture. 3. A small low-attenuation fluid collection is identified within the anterior mediastinum/retrosternal space. This is nonspecific finding in the early postoperative time frame. 4. Small pleural effusions. 5. Prior granulomatous disease.   Electronically Signed   By: Kerby Moors M.D.   On: 05/08/2015 12:18    Assessment/Plan: S/P Procedure(s) (LRB): STERNAL WOUND DEBRIDEMENT (N/A) APPLICATION OF WOUND VAC (N/A)  1 vac in place , micro neg thus far, may be able to narrow abx since I+D complete 2 leukocytosis improving 3 cont coumadin 4 v paced- hemodyn stable 5 reduce lasix to daily   LOS: 15 days    [name],[name] 05/10/2015  I have seen and examined the patient and agree with the assessment and plan as outlined.  WBC trending down.  No fevers.  Will recheck WBC and wound in am tomorrow.  Rexene Alberts 05/10/2015 8:31 AM

## 2015-05-10 NOTE — Discharge Summary (Signed)
[address]       [place] [address]             [phone]              Discharge Summary  Name: [name] DOB: 11/03/1934 80 y.o. MRN: [id number]   Admission Date: 04/25/2015 Discharge Date: 05/11/2015    Admitting Diagnosis: Severe mitral regurgitation Coronary artery disease Recurrent persistent atrial fibrillation   Discharge Diagnosis:  Severe mitral regurgitation Moderate to severe tricuspid regurgitation Coronary artery disease Recurrent persistent atrial fibrillation Expected postoperative blood loss anemia Chronic combined systolic and diastolic CHF  Superficial sternal wound infection  Principal Problem:   S/P mitral valve replacement with bioprosthetic valve, tricuspid valve repair, maze procedure and CABG x1 Active Problems:   Atrial fibrillation, unspecified   Chronic systolic heart failure   Essential hypertension   Mitral regurgitation   Severe mitral regurgitation   Chronic systolic congestive heart failure   Pulmonary hypertension   Tricuspid regurgitation   Chronic combined systolic and diastolic CHF (congestive heart failure)   Coronary artery disease   S/P tricuspid valve repair   S/P Maze operation for atrial fibrillation   S/P CABG x 1   S/P placement of cardiac pacemaker 05/02/15, medtronic   Bradycardia   Post-surgical complete heart block, symptomatic     Past Medical History  Diagnosis Date  . Hypertension   . COPD (chronic obstructive pulmonary disease)   . Hyperlipidemia   . Meniere disease   . HX: breast cancer   . Acute CHF (congestive heart failure)   . Atrial fibrillation   . Mitral regurgitation   . COPD (chronic obstructive pulmonary disease)   . Heart murmur     as child  . Hypothyroidism   . Cancer     hx breast cancer  . Breast cancer 08/09/2001    right/rad  . Pulmonary hypertension   . Tricuspid regurgitation   . Chronic combined systolic and diastolic CHF (congestive heart  failure)   . Coronary artery disease   . PONV (postoperative nausea and vomiting)   . Shortness of breath dyspnea     on exertion  . Anxiety   . GERD (gastroesophageal reflux disease)   . Arthritis   . s/p mitral valve replacement with bioprosthetic tissue valve 04/25/2015    27 mm [facility] Mitral bovine bioprosthetic tissue valve  . 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 S/P Maze operation for atrial fibrillation 04/25/2015    Complete bilateral atrial lesion set using cryothermy and bipolar radiofrequency ablation with clipping of LA appendage  . S/P CABG x 1 04/25/2015    LIMA to LAD  . Post-surgical complete heart block, symptomatic 05/03/2015      Procedures: 1. Mitral Valve Replacement (27 mm Edwards Magna Mitral Bovine Bioprosthetic Tissue Valve) Tricuspid Valve Repair (26 mm Edwards mc3 ring annuloplasty) Coronary Artery Bypass Grafting x 1 (Left internal mammary artery to distal left anterior descending coronary artery) Maze Procedure (complete bilateral atrial atrial lesion set using bipolar radiofrequency and cryothermy ablation) Clipping of left atrial appendage (Atriclip size 42mm) - 04/25/2015   2. Sternal wound debridement Placement of wound VAC - 05/09/2015    HPI:  The patient is a 80 y.o. female with history of hypertension, COPD, hyperlipidemia, and Mnire's disease who more recently has been diagnosed with acute on chronic combined systolic and diastolic congestive heart failure with mitral  regurgitation, tricuspid regurgitation, and persistent atrial fibrillation, status post DC cardioversion. The patient states that she suffered from scarlet fever at age 77 or 52. She was first told that she had a heart murmur at age 62. She never had any formal cardiac evaluation until more recently. She has a long-standing history of tobacco abuse but quit smoking in 1999. She has remained physically active and functional independent  until the past year, when she began to develop progressive symptoms of exertional shortness of breath. Symptoms became acutely worse in December and were initially attributed to COPD.  She was treated with bronchodilator therapy and home oxygen. The patient was hospitalized in February of this year at [facility] with acute systolic congestive heart failure and presumably new-onset atrial fibrillation. Transthoracic echocardiogram performed at that time reportedly demonstrated ejection fraction 35-40% with apical wall hypokinesis and moderate to severe mitral regurgitation. Left and right heart catheterization was performed 10/28/2014 demonstrating mild nonobstructive coronary artery disease. Left ventricular ejection fraction was estimated 40%. Right heart catheterization revealed moderate pulmonary hypertension with PA pressures measured 50/28 mmHg and mean pulmonary capillary wedge pressure 26 mmHg. There were large V waves consistent with severe mitral regurgitation. Mean central venous pressure was measured 18 mmHg. The patient's atrial fibrillation was treated using Cardizem and bisoprolol for rate control with Eliquis for anticoagulation. One month later, the patient underwent TEE guided cardioversion, and she has been maintaining sinus rhythm ever since. The patient has been followed carefully by Dr. Fletcher Anon and has done fairly well on medical therapy. However, the patient continues to experience stable symptoms of exertional shortness of breath consistent with chronic combined systolic and diastolic congestive heart failure, New York Heart Association functional class IIB. Follow-up transthoracic echocardiogram performed 01/27/2015 revealed improved left ventricular systolic function with ejection fraction estimated 50-55%. However, there remained severe mitral regurgitation. The patient was referred to Dr. Roxy Manns for surgical consultation. She was originally seen in consultation on  03/09/2015 and she has been seen more recently on 03/28/2015. It was Dr. Guy Sandifer opinion that she would best be treated with mitral valve repair/replacement, CABG x 1, and Maze procedure at this time. All risks, benefits and alternatives of surgery were explained in detail, and the patient agreed to proceed.    Hospital Course:  The patient was admitted to [facility] on 04/25/2015. The patient was taken to the operating room and underwent the above procedure.  Intraoperatively, she was noted on TEE to have 3+ tricuspid regurgitation, therefore, a tricuspid repair was also performed.  The postoperative course was initially notable for junctional bradycardia which required AV pacing. The patient was monitored closely and treated conservatively, but remained bradycardic/complete heart block under the pacer.  Electrophysiology cardiology was consulted for assistance with management and the patient underwent placement of a dual chamber pacemaker on 05/02/2015.   She then developed drainage from her sternal incision. She was started empirically on Vancomycin and cultures were obtained.  Despite therapy and negative cultures, her white blood count increased.  Antibiotic coverage was broadened and the wound was recultured. Purulent drainage persisted with leukocytosis to 18,000.  The patient was returned to the operating room on 05/09/2015 and underwent sternal debridement with wound VAC placement. Intraoperatively, cultures were obtained and there was no evidence of deep sternal infection of mediastinitis.  Since that time, her leukocytosis has improved significantly.  The patient has remained afebrile and all cultures are negative to date.    The patient has remained weak and deconditioned.  PT and OT were consulted for help with rehabilitation. The wound VAC has been changed and her sternal wound is stable with no purulence.  Her pacer is functioning appropriately. Vital signs have been stable. She has been  diuresed aggressively for volume overload and CHF and her volume status is stable and improving. A PICC line has been placed for continued antibiotic therapy.  It is felt that the patient will require a short term SNF placement for continued wound care and IV antibiotics.  She presently is medically stable for transfer once a bed is available.    Recent vital signs:  Filed Vitals:   05/11/15 0437  BP: 147/56  Pulse: 76  Temp: 97.9 F (36.6 C)  Resp: 18    Recent laboratory studies:  CBC:  Recent Labs  05/10/15 0515 05/11/15 0504  WBC 14.3* 12.1*  HGB 8.3* 8.4*  HCT 26.9* 27.3*  PLT 354 360   BMET:   Recent Labs  05/09/15 0510 05/10/15 0515  NA 136 138  K 3.9 4.1  CL 95* 94*  CO2 35* 37*  GLUCOSE 119* 123*  BUN 9 8  CREATININE 0.65 0.73  CALCIUM 8.7* 8.9    PT/INR:   Recent Labs  05/11/15 0504  LABPROT 20.5*  INR 1.76*     Discharge Medications:      Medication List    STOP taking these medications        bisoprolol 10 MG tablet  Commonly known as:  ZEBETA      TAKE these medications        acetaminophen 325 MG tablet  Commonly known as:  TYLENOL  Take 2 tablets (650 mg total) by mouth every 4 (four) hours as needed for mild pain (or Fever >/= 101).     amiodarone 200 MG tablet  Commonly known as:  PACERONE  Take 1 tablet (200 mg total) by mouth 2 (two) times daily.     aspirin 81 MG EC tablet  Take 1 tablet (81 mg total) by mouth daily.     atorvastatin 10 MG tablet  Commonly known as:  LIPITOR  Take 1 tablet (10 mg total) by mouth daily at 6 PM.     benzonatate 100 MG capsule  Commonly known as:  TESSALON  Take 100 mg by mouth 3 (three) times daily as needed for cough (for cold).     ciprofloxacin 500 MG tablet  Commonly known as:  CIPRO  Take 1 tablet (500 mg total) by mouth 2 (two) times daily. For 7 days     docusate sodium 100 MG capsule  Commonly known as:  COLACE  Take 2 capsules (200 mg total) by mouth 2 (two) times  daily as needed for mild constipation.     ferrous sulfate 325 (65 FE) MG tablet  Take 1 tablet (325 mg total) by mouth daily with breakfast.     fluticasone 50 MCG/ACT nasal spray  Commonly known as:  FLONASE  Place 1 spray into both nostrils daily as needed for allergies.     furosemide 20 MG tablet  Commonly known as:  LASIX  Take 20 mg by mouth 2 (two) times daily.     Indacaterol Maleate 75 MCG Caps  Place into inhaler and inhale daily.     ipratropium-albuterol 0.5-2.5 (3) MG/3ML Soln  Commonly known as:  DUONEB  Take 3 mLs by nebulization every 6 (six) hours as needed (for shortness of breath).     levothyroxine 88 MCG tablet  Commonly known as:  SYNTHROID, LEVOTHROID  Take 88 mcg by mouth daily before breakfast.     losartan 100 MG tablet  Commonly known as:  COZAAR  Take 100 mg by mouth daily.     oxyCODONE 5 MG immediate release tablet  Commonly known as:  Oxy IR/ROXICODONE  Take 1-2 tablets (5-10 mg total) by mouth every 3 (three) hours as needed for severe pain.     pantoprazole 40 MG tablet  Commonly known as:  PROTONIX  Take 40 mg by mouth daily.     potassium chloride SA 20 MEQ tablet  Commonly known as:  K-DUR,KLOR-CON  Take 1 tablet (20 mEq total) by mouth daily.     PRESERVISION AREDS PO  Take 2 tablets by mouth daily.     spironolactone 25 MG tablet  Commonly known as:  ALDACTONE  Take 1 tablet (25 mg total) by mouth daily.     traMADol 50 MG tablet  Commonly known as:  ULTRAM  Take 1-2 tablets (50-100 mg total) by mouth every 4 (four) hours as needed for moderate pain.     warfarin 3 MG tablet  Commonly known as:  COUMADIN  Take 1 tablet (3 mg total) by mouth daily at 6 PM.          Discharge Instructions:   1. Please obtain vital signs at least one time daily 2. Please weigh the patient daily. If he or she continues to gain weight or develops lower extremity edema, contact the office at [phone]. 3. Ambulate patient at least  three times daily and please use sternal precautions. 4. The patient is to refrain from driving, heavy lifting or strenuous activity.   5. Please change wound VAC Saturday 05/13/2015, then resume every MWF 6. May continue heart healthy diet. 7. Please draw bloodwork (PT/INR) within 48 hours of discharge and send results to Dr. Tyrell Antonio office ([facility]) for management of anticoagulation [phone]    Follow Up: Follow-up Information    Follow up with [facility] On 05/17/2015.   Why:  For pacemaker check at 11:30AM   Contact information:   [address]  [place] [address]       Follow up with Kathlyn Sacramento, MD On 06/01/2015.   Specialty:  Cardiology   Why:  Appointment is at 10:15   Contact information:   [address] [place] [address] [phone]       Follow up with Rexene Alberts, MD On 05/22/2015.   Specialty:  Cardiothoracic Surgery   Why:  Have a chest x-ray at [place] at 8:30, then see MD at 9:30   Contact information:   [address] [place] [address] [phone]       Please follow up.   Why:  Please draw a PT/INR within 48 hours of discharge and send results to the [facility] at Dr. Tyrell Antonio office      Discharge Instructions    Amb Referral to Cardiac Rehabilitation    Complete by:  As directed   Congestive Heart Failure: If diagnosis is Heart Failure, patient MUST meet each of the CMS criteria: 1. Left Ventricular Ejection Fraction </= 35% 2. NYHA class II-IV symptoms despite being on optimal heart failure therapy for at least 6 weeks. 3. Stable = have not had a recent (<6 weeks) or planned (<6 months) major cardiovascular hospitalization or procedure  Program Details: - Physician supervised classes - 1-3  classes per week over a 12-18 week period, generally for a total of 36 sessions  Physician Certification: I certify that the above Cardiac Rehabilitation  treatment is medically necessary and is medically approved by me for treatment of this patient. The patient is willing and cooperative, able to ambulate and medically stable to participate in exercise rehabilitation. The participant's progress and Individualized Treatment Plan will be reviewed by the Medical Director, Cardiac Rehab staff and as indicated by the Referring/Ordering Physician.  Diagnosis:   Valve Replacement/Repair CABG            The patient has been discharged on:  1.Beta Blocker: Yes [  ]  No [ x ]  If No, reason: Postop bradycardia   2.Ace Inhibitor/ARB: Yes [ x ]  No [  ]  If No, reason:    3.Statin: Yes [ x ]  No [ ]   If No, reason:    4.Ecasa: Yes [ x ]  No [ ]   If No, reason:     Sanaii Caporaso 05/11/2015, 2:22 PM

## 2015-05-11 ENCOUNTER — Encounter
Admission: RE | Admit: 2015-05-11 | Discharge: 2015-05-11 | Disposition: A | Discharge status: Home / Self Care | Payer: Medicare PPO | Source: Ambulatory Visit | Attending: Internal Medicine | Admitting: Internal Medicine

## 2015-05-11 DIAGNOSIS — I4891 Unspecified atrial fibrillation: Secondary | ICD-10-CM | POA: Insufficient documentation

## 2015-05-11 LAB — WOUND CULTURE: Culture: NO GROWTH

## 2015-05-11 LAB — PROTIME-INR
INR: 1.76 — ABNORMAL HIGH (ref 0.00–1.49)
Prothrombin Time: 20.5 seconds — ABNORMAL HIGH (ref 11.6–15.2)

## 2015-05-11 LAB — CBC
HEMATOCRIT: 27.3 % — AB (ref 36.0–46.0)
Hemoglobin: 8.4 g/dL — ABNORMAL LOW (ref 12.0–15.0)
MCH: 30.2 pg (ref 26.0–34.0)
MCHC: 30.8 g/dL (ref 30.0–36.0)
MCV: 98.2 fL (ref 78.0–100.0)
Platelets: 360 10*3/uL (ref 150–400)
RBC: 2.78 MIL/uL — AB (ref 3.87–5.11)
RDW: 18.3 % — ABNORMAL HIGH (ref 11.5–15.5)
WBC: 12.1 10*3/uL — AB (ref 4.0–10.5)

## 2015-05-11 MED ORDER — WARFARIN SODIUM 3 MG PO TABS: 3.0000 mg | ORAL_TABLET | Freq: Every day | ORAL | Status: DC

## 2015-05-11 MED ORDER — HEPARIN SOD (PORK) LOCK FLUSH 10 UNIT/ML IV SOLN: 10.0000 [IU] | INTRAVENOUS | Status: DC | PRN

## 2015-05-11 MED ORDER — ATORVASTATIN CALCIUM 10 MG PO TABS: 10.0000 mg | ORAL_TABLET | Freq: Every day | ORAL | Status: DC

## 2015-05-11 MED ORDER — TRAMADOL HCL 50 MG PO TABS: 50.0000 mg | ORAL_TABLET | ORAL | Status: DC | PRN

## 2015-05-11 MED ORDER — ACETAMINOPHEN 325 MG PO TABS: 650.0000 mg | ORAL_TABLET | ORAL | Status: DC | PRN

## 2015-05-11 MED ORDER — OXYCODONE HCL 5 MG PO TABS: 5.0000 mg | ORAL_TABLET | ORAL | Status: DC | PRN

## 2015-05-11 MED ORDER — CIPROFLOXACIN HCL 500 MG PO TABS: 500.0000 mg | ORAL_TABLET | Freq: Two times a day (BID) | ORAL | Status: DC

## 2015-05-11 MED ORDER — POTASSIUM CHLORIDE CRYS ER 20 MEQ PO TBCR: 20.0000 meq | EXTENDED_RELEASE_TABLET | Freq: Every day | ORAL | Status: DC

## 2015-05-11 MED ORDER — DOCUSATE SODIUM 100 MG PO CAPS: 200.0000 mg | ORAL_CAPSULE | Freq: Two times a day (BID) | ORAL | Status: DC | PRN

## 2015-05-11 MED ORDER — LEVOTHYROXINE SODIUM 100 MCG PO TABS
100.0000 ug | ORAL_TABLET | Freq: Every day | ORAL | Status: DC
  Administered 2015-05-11: 100 ug via ORAL
  Filled 2015-05-11: qty 1

## 2015-05-11 MED ORDER — ASPIRIN 81 MG PO TBEC: 81.0000 mg | DELAYED_RELEASE_TABLET | Freq: Every day | ORAL | Status: DC

## 2015-05-11 MED ORDER — FERROUS SULFATE 325 (65 FE) MG PO TABS: 325.0000 mg | ORAL_TABLET | Freq: Every day | ORAL | Status: DC

## 2015-05-11 NOTE — Progress Notes (Signed)
CARDIAC REHAB PHASE I   PRE:  Rate/Rhythm: 85 afib    BP: sitting 97/61    SaO2: 96 2L  MODE:  Ambulation: 300 ft   POST:  Rate/Rhythm: 94 afib pacing    BP: sitting 110/68     SaO2: 89 2L, up to 93 2L with rest  Pt feeling tired today. Sts she didn't sleep well. Rest x2 while walking for SOB and weakness. SAO2 low on return to room.  Return to recliner. Ed completed with husband present.   [place], Remy, ACSM 05/11/2015 2:12 PM

## 2015-05-11 NOTE — Progress Notes (Addendum)
[address]       [place] [address]             [phone]      2 Days Post-Op Procedure(s) (LRB): STERNAL WOUND DEBRIDEMENT (N/A) APPLICATION OF WOUND VAC (N/A) Subjective: Feels better, only minor discomfort  Objective: Vital signs in last 24 hours: Temp:  [97.8 F (36.6 C)-97.9 F (36.6 C)] 97.9 F (36.6 C) (09/01 0437) Pulse Rate:  [75-77] 76 (09/01 0437) Cardiac Rhythm:  [-] Ventricular paced (08/31 1940) Resp:  [18] 18 (09/01 0437) BP: (122-147)/(56-74) 147/56 mmHg (09/01 0437) SpO2:  [99 %-100 %] 99 % (09/01 0437) Weight:  [141 lb 1.6 oz (64.003 kg)] 141 lb 1.6 oz (64.003 kg) (09/01 0437)  Hemodynamic parameters for last 24 hours:    Intake/Output from previous day: 08/31 0701 - 09/01 0700 In: 920 [P.O.:920] Out: 2000 [Urine:2000] Intake/Output this shift:    General appearance: alert, cooperative and no distress Heart: regular rate and rhythm Lungs: clear to auscultation bilaterally Abdomen: benign Extremities: + edema, but conts to improve slowly Wound: incis- vac in place  Lab Results:  Recent Labs  05/10/15 0515 05/11/15 0504  WBC 14.3* 12.1*  HGB 8.3* 8.4*  HCT 26.9* 27.3*  PLT 354 360   BMET:  Recent Labs  05/09/15 0510 05/10/15 0515  NA 136 138  K 3.9 4.1  CL 95* 94*  CO2 35* 37*  GLUCOSE 119* 123*  BUN 9 8  CREATININE 0.65 0.73  CALCIUM 8.7* 8.9    PT/INR:  Recent Labs  05/11/15 0504  LABPROT 20.5*  INR 1.76*   ABG    Component Value Date/Time   PHART 7.391 04/26/2015 0952   HCO3 25.9* 04/26/2015 0952   TCO2 34 04/30/2015 1735   ACIDBASEDEF 3.0* 04/26/2015 0832   O2SAT 61.7 04/29/2015 0710   CBG (last 3)  No results for input(s): GLUCAP in the last 72 hours.  Meds Scheduled Meds: . amiodarone  200 mg Oral BID  . aspirin EC  81 mg Oral Daily  . bisacodyl  10 mg Oral Daily   Or  . bisacodyl  10 mg Rectal Daily  . ciprofloxacin  500 mg Oral BID  . docusate sodium  200 mg Oral Daily    . ferrous sulfate  325 mg Oral Q breakfast  . folic acid  1 mg Oral Daily  . furosemide  40 mg Oral Daily  . levothyroxine  88 mcg Oral QAC breakfast  . losartan  100 mg Oral Daily  . metoCLOPramide  10 mg Oral TID AC & HS  . pantoprazole  40 mg Oral Daily  . potassium chloride  20 mEq Oral Daily  . sodium chloride  10-40 mL Intracatheter Q12H  . sodium chloride  3 mL Intravenous Q12H  . spironolactone  25 mg Oral Daily  . vancomycin  1,500 mg Intravenous Q24H  . warfarin  2 mg Oral q1800  . Warfarin - Physician Dosing Inpatient   Does not apply q1800   Continuous Infusions:  PRN Meds:.sodium chloride, acetaminophen **OR** acetaminophen, HYDROcodone-acetaminophen, ipratropium-albuterol, metoprolol, ondansetron (ZOFRAN) IV, oxyCODONE, sodium chloride, sodium chloride, traMADol  Xrays No results found.  Assessment/Plan: S/P Procedure(s) (LRB): STERNAL WOUND DEBRIDEMENT (N/A) APPLICATION OF WOUND VAC (N/A)  1 Vac- wound  to be checked today, will need SNF plan for how often to change 2 will need plan for abx 3 will start low dose statin 4 INR has dipped a little, increase dose 5  TSH from 8/22 high- 6.2- will increase synthroid a little Poss d/c today  LOS: 16 days    Marvis Saefong E 05/11/2015

## 2015-05-11 NOTE — Anesthesia Preprocedure Evaluation (Signed)
Anesthesia Evaluation  Patient identified by MRN, date of birth, ID band Patient awake    Reviewed: Allergy & Precautions, NPO status , Patient's Chart, lab work & pertinent test results  Airway Mallampati: II  TM Distance: >3 FB Neck ROM: Full    Dental  (+) Teeth Intact   Pulmonary former smoker,  breath sounds clear to auscultation        Cardiovascular hypertension, Rhythm:Regular Rate:Normal     Neuro/Psych    GI/Hepatic   Endo/Other    Renal/GU      Musculoskeletal   Abdominal   Peds  Hematology   Anesthesia Other Findings   Reproductive/Obstetrics                             Anesthesia Physical Anesthesia Plan  ASA: III  Anesthesia Plan: MAC   Post-op Pain Management:    Induction: Intravenous  Airway Management Planned: Natural Airway and Simple Face Mask  Additional Equipment:   Intra-op Plan:   Post-operative Plan:   Informed Consent: I have reviewed the patients History and Physical, chart, labs and discussed the procedure including the risks, benefits and alternatives for the proposed anesthesia with the patient or authorized representative who has indicated his/her understanding and acceptance.     Plan Discussed with: CRNA and Anesthesiologist  Anesthesia Plan Comments:         Anesthesia Quick Evaluation

## 2015-05-11 NOTE — Anesthesia Postprocedure Evaluation (Signed)
  Anesthesia Post-op Note  Patient: [name]  Procedure(s) Performed: Procedure(s): STERNAL WOUND DEBRIDEMENT (N/A) APPLICATION OF WOUND VAC (N/A)  Patient Location: PACU  Anesthesia Type:MAC  Level of Consciousness: awake, alert  and oriented  Airway and Oxygen Therapy: Patient Spontanous Breathing and Patient connected to nasal cannula oxygen  Post-op Pain: none  Post-op Assessment: Post-op Vital signs reviewed, Patient's Cardiovascular Status Stable, Respiratory Function Stable, Patent Airway and Pain level controlled              Post-op Vital Signs: stable  Last Vitals:  Filed Vitals:   05/11/15 0437  BP: 147/56  Pulse: 76  Temp: 36.6 C  Resp: 18    Complications: No apparent anesthesia complications

## 2015-05-11 NOTE — Consult Note (Signed)
WOC wound consult note Reason for Consult: first postoperative NPWT VAC dressing Wound type:surgical/sternum Measurement: 5cm x 2cm x 1.5cm  Wound LRJ:PVGKK, red, some yellow fibrin in the base, palpable bone Drainage (amount, consistency, odor) oozing some sanguinous drainage with dressing change Periwound: intact  Dressing procedure/placement/frequency: Periwound protected with VAC drape, 1pc of black foam placed in the wound bed. Sealed with drape and 176mmHG. Patient tolerated well, premedicated prior to dressing change. Dr. Roxy Manns at bedside to assess wound at the time of the dressing change today.  OK for bedside nurse to change VAC.  [place], [place]

## 2015-05-11 NOTE — Progress Notes (Signed)
CSW informed that now pt is interested in going to SNF at time of DC.  CSW called facility to inform that pt has changed their mind- they have informed insurance that pt is not longer wanting SNF- they have called [employer] to see if Josem Kaufmann is still good through today- no response at this time.  They will not be able to resubmit for auth today so if Josem Kaufmann has been revoked then pt is unable to transfer to [facility] today- they have also canceled the wound vac delivery- unsure if they can get the wound vac in time if pt is ready for DC today.  CSW will continue to follow.  Domenica Reamer, [place] Social Worker [phone]

## 2015-05-11 NOTE — Care Management Note (Signed)
Case Management Note Initial CM note entered by [facility] 05/02/15   Patient Details  Name: [name] MRN: [id number] Date of Birth: Dec 06, 1934  Subjective/Objective:    Pt lives with spouse who will provide 24/7 assistance upon discharge.  PT/OT recommend home therapy, 3-N-1, and rolling walker, pt and spouse agree.  Provided list of agencies in [facility], referral made to [facility] per request.                            Expected Discharge Plan:  [place]  Discharge planning Services  CM Consult  Post Acute Care Choice:  Durable Medical Equipment, Home Health Choice offered to:  Spouse  DME Arranged:  3-N-1, Walker rolling, tub seat DME Agency:  [facility] Arranged:  PT, OT [place] Agency:  [facility]  Status of Service:  Completed, signed off  Medicare Important Message Given:  Yes-second notification given  Discussed in Long Length of Stay: 05/09/15  Additional Comments:    05/11/15- had received word that pt had changed her mind yesterday regarding STSNF and wanting to return home with [facility] instead- spoke with pt and spouse at beside regarding d/c plans with [facility] and was to offer choice for [facility] services- upon entering room to discuss d/c plans with [facility] pt expressed that she felt that she had made a mistake with her decision and that she had changed her mind overnight and now wanted to still go to SNF instead of returning home- pt asked if it was too last to change her mind on d/c plan- explained to pt that we would have to have CSW check with facility to see if bed was still available and to see if her insurance auth that was good through today was still valid also would need to see if the SNF could still have wound VAC there for today. If these things were still in place then it might still be an option to d/c to the rehab facility- if not then we would need to see if we could get another auth. From insurance- pt and spouse  voiced understanding. Call made to [facility]. Who is to check on the option for STSNF today-  Update: 1400- per CSW pt still has [place] from insurance for STSNF and can still go today wound VAC will be available at SNF- have spoken with PA and plan for pt to d/c to SNF today- CSW and CM have informed pt of this and she is relieved that it has worked out for her to still go for rehab. CSW in process of making arrangements to transport to SNF via ambulance.   05/08/15- Jamie Gibbons RN, BSN [phone]- pt continues to have elevated WBC and sternal wound drainage- plan for I&D in OR tomorrow- pt/spouse are interested in rehab option at this time CSW to follow for possible STSNF placement at discharge- NCM to continue to follow also for d/c needs  Jamie Quinones, RN, BSN [phone] CM provided HH/DME order request on physician sticky note, and requested via bedside nurse.   Jamie Patricia, RN 05/11/2015, 2:32 PM

## 2015-05-11 NOTE — Progress Notes (Signed)
Patient will discharge to [facility] Anticipated discharge date:05/11/15 Family notified: pt spouse at bedside Transportation by [facility]- scheduled for 3pm  CSW signing off.  Domenica Reamer, [place] Social Worker [phone]

## 2015-05-12 LAB — GLUCOSE, CAPILLARY: Glucose-Capillary: 110 mg/dL — ABNORMAL HIGH (ref 65–99)

## 2015-05-12 LAB — TISSUE CULTURE
CULTURE: NO GROWTH
GRAM STAIN: NONE SEEN

## 2015-05-13 ENCOUNTER — Other Ambulatory Visit: Payer: Self-pay | Admitting: Thoracic Surgery (Cardiothoracic Vascular Surgery)

## 2015-05-14 LAB — ANAEROBIC CULTURE: GRAM STAIN: NONE SEEN

## 2015-05-16 DIAGNOSIS — I4891 Unspecified atrial fibrillation: Secondary | ICD-10-CM | POA: Diagnosis not present

## 2015-05-16 LAB — PROTIME-INR
INR: 2.72
PROTHROMBIN TIME: 28.9 s — AB (ref 11.4–15.0)

## 2015-05-17 ENCOUNTER — Ambulatory Visit (INDEPENDENT_AMBULATORY_CARE_PROVIDER_SITE_OTHER): Payer: Medicare PPO | Admitting: *Deleted

## 2015-05-17 DIAGNOSIS — I442 Atrioventricular block, complete: Secondary | ICD-10-CM | POA: Diagnosis not present

## 2015-05-17 DIAGNOSIS — R001 Bradycardia, unspecified: Secondary | ICD-10-CM

## 2015-05-17 DIAGNOSIS — I9789 Other postprocedural complications and disorders of the circulatory system, not elsewhere classified: Secondary | ICD-10-CM

## 2015-05-17 DIAGNOSIS — I519 Heart disease, unspecified: Secondary | ICD-10-CM

## 2015-05-17 LAB — CUP PACEART INCLINIC DEVICE CHECK
Battery Remaining Longevity: 132 mo
Battery Voltage: 3.12 V
Brady Statistic AP VP Percent: 0.63 %
Brady Statistic AP VS Percent: 0.03 %
Brady Statistic AS VP Percent: 70.87 %
Brady Statistic AS VS Percent: 28.47 %
Brady Statistic RA Percent Paced: 0.65 %
Brady Statistic RV Percent Paced: 71.5 %
Date Time Interrogation Session: 20160907155208
Lead Channel Impedance Value: 380 Ohm
Lead Channel Impedance Value: 456 Ohm
Lead Channel Impedance Value: 494 Ohm
Lead Channel Impedance Value: 513 Ohm
Lead Channel Pacing Threshold Amplitude: 0.75 V
Lead Channel Pacing Threshold Pulse Width: 0.4 ms
Lead Channel Sensing Intrinsic Amplitude: 1.625 mV
Lead Channel Sensing Intrinsic Amplitude: 9.75 mV
Lead Channel Setting Pacing Amplitude: 3.5 V
Lead Channel Setting Pacing Amplitude: 3.5 V
Lead Channel Setting Pacing Pulse Width: 0.4 ms
Lead Channel Setting Sensing Sensitivity: 2.8 mV
Zone Setting Detection Interval: 400 ms
Zone Setting Detection Interval: 400 ms

## 2015-05-17 NOTE — Progress Notes (Signed)
PPM implanted by Dr. Curt Bears Dr. Rayann Heman 05-02-15. Wound check appointment. Steri-strips removed. Wound without redness or edema. Incision edges approximated, wound well healed. Normal device function. Thresholds, sensing, and impedances consistent with implant measurements. Device programmed at 3.5V for extra safety margin until 3 month visit. Histogram distribution appropriate for patient and level of activity. 100% AT/AF +warfarin. No high ventricular rates noted. Patient educated about wound care, arm mobility, lifting restrictions. ROV with [facility] in [facility] 08/01/15 due to proximity.

## 2015-05-19 ENCOUNTER — Ambulatory Visit: Payer: Medicare PPO | Admitting: Cardiovascular Disease

## 2015-05-19 ENCOUNTER — Other Ambulatory Visit: Payer: Self-pay | Admitting: Thoracic Surgery (Cardiothoracic Vascular Surgery)

## 2015-05-19 DIAGNOSIS — Z951 Presence of aortocoronary bypass graft: Secondary | ICD-10-CM

## 2015-05-21 DIAGNOSIS — I4891 Unspecified atrial fibrillation: Secondary | ICD-10-CM | POA: Diagnosis not present

## 2015-05-22 ENCOUNTER — Ambulatory Visit
Admission: RE | Admit: 2015-05-22 | Discharge: 2015-05-22 | Disposition: A | Discharge status: Home / Self Care | Payer: Medicare PPO | Source: Ambulatory Visit | Attending: Thoracic Surgery (Cardiothoracic Vascular Surgery) | Admitting: Thoracic Surgery (Cardiothoracic Vascular Surgery)

## 2015-05-22 ENCOUNTER — Ambulatory Visit (INDEPENDENT_AMBULATORY_CARE_PROVIDER_SITE_OTHER): Payer: Self-pay | Admitting: Thoracic Surgery (Cardiothoracic Vascular Surgery)

## 2015-05-22 ENCOUNTER — Encounter: Payer: Self-pay | Admitting: Thoracic Surgery (Cardiothoracic Vascular Surgery)

## 2015-05-22 VITALS — BP 120/73 | HR 80 | Resp 16 | Ht 61.0 in | Wt 130.0 lb

## 2015-05-22 DIAGNOSIS — Z951 Presence of aortocoronary bypass graft: Secondary | ICD-10-CM

## 2015-05-22 DIAGNOSIS — I340 Nonrheumatic mitral (valve) insufficiency: Secondary | ICD-10-CM

## 2015-05-22 DIAGNOSIS — T8132XD Disruption of internal operation (surgical) wound, not elsewhere classified, subsequent encounter: Secondary | ICD-10-CM

## 2015-05-22 DIAGNOSIS — Z9889 Other specified postprocedural states: Secondary | ICD-10-CM

## 2015-05-22 DIAGNOSIS — Z8679 Personal history of other diseases of the circulatory system: Secondary | ICD-10-CM

## 2015-05-22 DIAGNOSIS — Z954 Presence of other heart-valve replacement: Secondary | ICD-10-CM

## 2015-05-22 DIAGNOSIS — Z952 Presence of prosthetic heart valve: Secondary | ICD-10-CM

## 2015-05-22 DIAGNOSIS — I071 Rheumatic tricuspid insufficiency: Secondary | ICD-10-CM

## 2015-05-22 DIAGNOSIS — I25119 Atherosclerotic heart disease of native coronary artery with unspecified angina pectoris: Secondary | ICD-10-CM

## 2015-05-22 DIAGNOSIS — I4891 Unspecified atrial fibrillation: Secondary | ICD-10-CM

## 2015-05-22 LAB — GLUCOSE, CAPILLARY: GLUCOSE-CAPILLARY: 138 mg/dL — AB (ref 65–99)

## 2015-05-22 NOTE — Patient Instructions (Signed)
Continue all previous medications without any changes at this time  

## 2015-05-22 NOTE — Progress Notes (Signed)
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 The patient's early postoperative recovery in the hospital was notable for persistent bradycardia for which she underwent permanent pacemaker placement on 05/02/2015.  She later developed localized superficial soft tissue wound infection involving the inferior aspect of her median sternotomy incision. Wound cultures remained no growth and the patient was treated with empiric vancomycin and ciprofloxacin. She underwent wound debridement and placement of wound VAC on 05/09/2015.  She was discharged to a local skilled nursing facility on 05/11/2015. She returns to the office today for routine follow-up and wound check. She reports that she is doing very well and she hopes to go home later this week. Her appetite has improved considerably. She has not had any fevers. She has minimal discomfort in her chest and has been taking only Tylenol as needed for pain.  Her strength has been slowly improving. She has been ambulating more although she still remains weak and requires significant assistance.  She has not had any palpitations. She denies any shortness of breath. Her lower extremity edema has nearly completely resolved. She still has intermittent dizzy spells that was similar to that she experienced prior to surgery and chronically related to Mnire's disease. Overall she feels well and is delighted with her progress.  Her prothrombin time was checked last week and INR therapeutic at 2.7 on 05/16/2015.   Current Outpatient Prescriptions  Medication Sig Dispense Refill  .  acetaminophen (TYLENOL) 325 MG tablet Take 2 tablets (650 mg total) by mouth every 4 (four) hours as needed for mild pain (or Fever >/= 101).    Marland Kitchen amiodarone (PACERONE) 200 MG tablet TAKE 1 TABLET TWICE A DAY 60 tablet 0  . aspirin EC 81 MG EC tablet Take 1 tablet (81 mg total) by mouth daily.    Marland Kitchen atorvastatin (LIPITOR) 10 MG tablet Take 1 tablet (10 mg total) by mouth daily at 6 PM.    . benzonatate (TESSALON) 100 MG capsule Take 100 mg by mouth 3 (three) times daily as needed for cough (for cold).     Marland Kitchen docusate sodium (COLACE) 100 MG capsule Take 2 capsules (200 mg total) by mouth 2 (two) times daily as needed for mild constipation. 10 capsule 0  . ferrous sulfate 325 (65 FE) MG tablet Take 1 tablet (325 mg total) by mouth daily with breakfast.  3  . fluticasone (FLONASE) 50 MCG/ACT nasal spray Place 1 spray into both nostrils daily as needed for allergies.     . furosemide (LASIX) 20 MG tablet Take 20 mg by mouth 2 (two) times daily.     . Indacaterol Maleate 75 MCG CAPS Place into inhaler and inhale daily.    Marland Kitchen ipratropium-albuterol (DUONEB) 0.5-2.5 (3) MG/3ML SOLN Take 3 mLs by nebulization every 6 (six) hours as needed (for shortness of breath).     Marland Kitchen levothyroxine (SYNTHROID, LEVOTHROID) 88 MCG tablet Take 88 mcg by mouth daily before breakfast.    . losartan (COZAAR) 100 MG tablet Take 100 mg by mouth daily.    Marland Kitchen  Multiple Vitamins-Minerals (PRESERVISION AREDS PO) Take 2 tablets by mouth daily.     . pantoprazole (PROTONIX) 40 MG tablet Take 40 mg by mouth daily.    . potassium chloride SA (K-DUR,KLOR-CON) 20 MEQ tablet Take 1 tablet (20 mEq total) by mouth daily.    Marland Kitchen spironolactone (ALDACTONE) 25 MG tablet Take 1 tablet (25 mg total) by mouth daily. 30 tablet 6  . traZODone (DESYREL) 50 MG tablet Take 50 mg by mouth at bedtime as needed for sleep.    Marland Kitchen warfarin (COUMADIN) 3 MG tablet Take 1 tablet (3 mg total) by mouth daily at 6 PM.    . oxyCODONE (OXY IR/ROXICODONE) 5 MG immediate  release tablet Take 1-2 tablets (5-10 mg total) by mouth every 3 (three) hours as needed for severe pain. (Patient not taking: Reported on 05/22/2015) 30 tablet 0  . traMADol (ULTRAM) 50 MG tablet Take 1-2 tablets (50-100 mg total) by mouth every 4 (four) hours as needed for moderate pain. (Patient not taking: Reported on 05/22/2015) 30 tablet 0   No current facility-administered medications for this visit.      Physical Exam:   BP 120/73 mmHg  Pulse 80  Resp 16  Ht 5\' 1"  (1.549 m)  Wt 130 lb (58.968 kg)  BMI 24.58 kg/m2  SpO2 94%  General:  Elderly but well-appearing  Chest:   Clear to auscultation with symmetrical breath sounds  CV:   Regular rate and rhythm without murmur  Incisions:  Wound VAC dressing is removed. The wound is clean although there is some purulence at the base and associated with an exposed sternal wire. The sternal wires removed at the bedside in the office. The remainder of the wound is very clean with early signs of granulation tissue. The superior portion of the sternal incision remains vertically intact and the sternum is stable.  Abdomen:  Soft and nontender  Extremities:  On well perfused with trace lower extremity edema  Diagnostic Tests:  CHEST 2 VIEW  COMPARISON: 05/03/2015, 05/08/2015  FINDINGS: Patient is status post median sternotomy for mitral valve replacement, tricuspid valve replacement, and left atrial appendage clip. Left subclavian 2 lead pacer noted. Mild cardiomegaly evident with improvement in the mild edema pattern. Trace residual right pleural effusion. Scattered bibasilar and left mid lung atelectasis. Calcified granulomata right midlung as before. No pneumothorax. Atherosclerosis of the aorta. Degenerative changes of the spine.  IMPRESSION: Improved aeration with resolving mild edema pattern. Improvement in the pleural effusions with a trace right effusion remaining  Residual bibasilar atelectasis versus scarring.  No  pneumothorax   Electronically Signed  By: Jerilynn Mages. Shick M.D.  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 Her sternal wound is making acceptable progress and there is no sign of ongoing infection at this time.    Plan:  I have not recommended any changes to the patient's current medications at this time. She will continue wound VAC with dressing changes every Monday Wednesday and Friday. The patient will return in 1 week for follow-up and wound check.   Valentina Gu. Roxy Manns, MD 05/22/2015 11:51 AM

## 2015-05-23 DIAGNOSIS — I4891 Unspecified atrial fibrillation: Secondary | ICD-10-CM | POA: Diagnosis not present

## 2015-05-23 LAB — PROTIME-INR
INR: 4.79
PROTHROMBIN TIME: 44.7 s — AB (ref 11.4–15.0)

## 2015-05-24 ENCOUNTER — Encounter
Admission: RE | Admit: 2015-05-24 | Discharge: 2015-05-24 | Disposition: A | Discharge status: Home / Self Care | Payer: Medicare PPO | Source: Skilled Nursing Facility | Attending: Internal Medicine | Admitting: Internal Medicine

## 2015-05-24 DIAGNOSIS — X58XXXA Exposure to other specified factors, initial encounter: Secondary | ICD-10-CM | POA: Insufficient documentation

## 2015-05-24 DIAGNOSIS — T888XXA Other specified complications of surgical and medical care, not elsewhere classified, initial encounter: Secondary | ICD-10-CM | POA: Insufficient documentation

## 2015-05-24 DIAGNOSIS — I4891 Unspecified atrial fibrillation: Secondary | ICD-10-CM | POA: Diagnosis not present

## 2015-05-24 LAB — PROTIME-INR
INR: 3.59
Prothrombin Time: 35.8 seconds — ABNORMAL HIGH (ref 11.4–15.0)

## 2015-05-25 DIAGNOSIS — I4891 Unspecified atrial fibrillation: Secondary | ICD-10-CM | POA: Diagnosis not present

## 2015-05-25 LAB — PROTIME-INR
INR: 1.96
Prothrombin Time: 22.5 seconds — ABNORMAL HIGH (ref 11.4–15.0)

## 2015-05-29 ENCOUNTER — Ambulatory Visit (INDEPENDENT_AMBULATORY_CARE_PROVIDER_SITE_OTHER): Payer: Self-pay | Admitting: Thoracic Surgery (Cardiothoracic Vascular Surgery)

## 2015-05-29 ENCOUNTER — Encounter: Payer: Self-pay | Admitting: Thoracic Surgery (Cardiothoracic Vascular Surgery)

## 2015-05-29 VITALS — BP 127/70 | HR 108 | Resp 20 | Ht 61.0 in | Wt 133.0 lb

## 2015-05-29 DIAGNOSIS — I5042 Chronic combined systolic (congestive) and diastolic (congestive) heart failure: Secondary | ICD-10-CM

## 2015-05-29 DIAGNOSIS — Z952 Presence of prosthetic heart valve: Secondary | ICD-10-CM

## 2015-05-29 DIAGNOSIS — Z9889 Other specified postprocedural states: Secondary | ICD-10-CM

## 2015-05-29 DIAGNOSIS — Z8679 Personal history of other diseases of the circulatory system: Secondary | ICD-10-CM

## 2015-05-29 DIAGNOSIS — Z954 Presence of other heart-valve replacement: Secondary | ICD-10-CM

## 2015-05-29 DIAGNOSIS — T8132XD Disruption of internal operation (surgical) wound, not elsewhere classified, subsequent encounter: Secondary | ICD-10-CM

## 2015-05-29 DIAGNOSIS — Z951 Presence of aortocoronary bypass graft: Secondary | ICD-10-CM

## 2015-05-29 LAB — WOUND CULTURE

## 2015-05-29 NOTE — Progress Notes (Signed)
[address]       [place] [address]             [phone]     CARDIOTHORACIC SURGERY OFFICE NOTE  Referring Provider is Wellington Hampshire, MD PCP is Ezequiel Kayser, MD   HPI:  Patient returns to the office today for wound check with superficial wound breakdown involving the inferior aspect of her median sternotomy incision. She was just seen in the office one week ago. Over the last week she has done well. She states that Saturday she felt as good as she has felt since surgery. She has been ambulating much better and she is scheduled to be discharged home later this week. Yesterday was a bad day and she experience increased cough and congestion and she was given a shot of prednisone by a nurse practitioner at the skilled nursing facility. She denies any fevers or chills. She has not had productive cough. She has had some pain in her chest seems to be related to the incision. She otherwise feels well.   Current Outpatient Prescriptions  Medication Sig Dispense Refill  . acetaminophen (TYLENOL) 325 MG tablet Take 2 tablets (650 mg total) by mouth every 4 (four) hours as needed for mild pain (or Fever >/= 101).    Marland Kitchen amiodarone (PACERONE) 200 MG tablet TAKE 1 TABLET TWICE A DAY 60 tablet 0  . aspirin EC 81 MG EC tablet Take 1 tablet (81 mg total) by mouth daily.    Marland Kitchen atorvastatin (LIPITOR) 10 MG tablet Take 1 tablet (10 mg total) by mouth daily at 6 PM.    . benzonatate (TESSALON) 100 MG capsule Take 100 mg by mouth 3 (three) times daily as needed for cough (for cold).     Marland Kitchen docusate sodium (COLACE) 100 MG capsule Take 2 capsules (200 mg total) by mouth 2 (two) times daily as needed for mild constipation. 10 capsule 0  . ferrous sulfate 325 (65 FE) MG tablet Take 1 tablet (325 mg total) by mouth daily with breakfast.  3  . fluticasone (FLONASE) 50 MCG/ACT nasal spray Place 1 spray into both nostrils daily as needed for allergies.     . furosemide (LASIX) 20 MG tablet  Take 20 mg by mouth 2 (two) times daily.     . Indacaterol Maleate 75 MCG CAPS Place into inhaler and inhale daily.    Marland Kitchen ipratropium-albuterol (DUONEB) 0.5-2.5 (3) MG/3ML SOLN Take 3 mLs by nebulization every 6 (six) hours as needed (for shortness of breath).     Marland Kitchen levothyroxine (SYNTHROID, LEVOTHROID) 88 MCG tablet Take 88 mcg by mouth daily before breakfast.    . losartan (COZAAR) 100 MG tablet Take 100 mg by mouth daily.    . Multiple Vitamins-Minerals (PRESERVISION AREDS PO) Take 2 tablets by mouth daily.     . pantoprazole (PROTONIX) 40 MG tablet Take 40 mg by mouth daily.    . potassium chloride SA (K-DUR,KLOR-CON) 20 MEQ tablet Take 1 tablet (20 mEq total) by mouth daily.    Marland Kitchen spironolactone (ALDACTONE) 25 MG tablet Take 1 tablet (25 mg total) by mouth daily. 30 tablet 6  . traZODone (DESYREL) 50 MG tablet Take 50 mg by mouth at bedtime as needed for sleep.    Marland Kitchen warfarin (COUMADIN) 3 MG tablet Take 1 tablet (3 mg total) by mouth daily at 6 PM.    . oxyCODONE (OXY IR/ROXICODONE) 5 MG immediate release tablet Take 1-2 tablets (5-10 mg total)  by mouth every 3 (three) hours as needed for severe pain. (Patient not taking: Reported on 05/22/2015) 30 tablet 0  . traMADol (ULTRAM) 50 MG tablet Take 1-2 tablets (50-100 mg total) by mouth every 4 (four) hours as needed for moderate pain. (Patient not taking: Reported on 05/22/2015) 30 tablet 0   No current facility-administered medications for this visit.      Physical Exam:   BP 127/70 mmHg  Pulse 108  Resp 20  Ht 5\' 1"  (1.549 m)  Wt 133 lb (60.328 kg)  BMI 25.14 kg/m2  SpO2 98%  General:  Well-appearing  Chest:   Clear with a few inspiratory crackles right lung base  CV:   Regular rate and rhythm  Incisions:  Wound VAC dressing is changed. There is no drainage appreciated and the wound is clean. There is some devitalized tissue at the wound base which is debrided sharply. A sternal wires exposed. The upper portion of the sternotomy appears  to be healing nicely.  Abdomen:  Soft and nontender  Extremities:  Warm and well-perfused  Diagnostic Tests:  n/a   Impression:  Exposed sternal wire within the base of the patient's open sternal wound. The wound appears to be healing otherwise without signs of ongoing infection.  Plan:  We plan to remove the exposed sternal wire using conscious sedation as now patient procedure later this week. The patient has been instructed to temporarily stop taking Coumadin.   Valentina Gu. Roxy Manns, MD 05/29/2015 6:58 PM

## 2015-05-29 NOTE — Patient Instructions (Signed)
Temporarily stop taking coumadin (warfarin)

## 2015-05-30 ENCOUNTER — Other Ambulatory Visit: Payer: Self-pay | Admitting: *Deleted

## 2015-05-30 DIAGNOSIS — T8132XA Disruption of internal operation (surgical) wound, not elsewhere classified, initial encounter: Secondary | ICD-10-CM

## 2015-05-30 DIAGNOSIS — I4891 Unspecified atrial fibrillation: Secondary | ICD-10-CM | POA: Diagnosis not present

## 2015-05-30 LAB — PROTIME-INR
INR: 3.39
PROTHROMBIN TIME: 34.3 s — AB (ref 11.4–15.0)

## 2015-06-01 ENCOUNTER — Encounter: Payer: Self-pay | Admitting: Cardiovascular Disease

## 2015-06-01 ENCOUNTER — Encounter: Payer: Self-pay | Admitting: Cardiology

## 2015-06-01 ENCOUNTER — Encounter (HOSPITAL_COMMUNITY): Payer: Self-pay | Admitting: *Deleted

## 2015-06-01 ENCOUNTER — Ambulatory Visit (INDEPENDENT_AMBULATORY_CARE_PROVIDER_SITE_OTHER): Payer: Medicare PPO | Admitting: Cardiovascular Disease

## 2015-06-01 VITALS — BP 130/70 | HR 103 | Ht 61.0 in | Wt 134.5 lb

## 2015-06-01 DIAGNOSIS — I5022 Chronic systolic (congestive) heart failure: Secondary | ICD-10-CM | POA: Diagnosis not present

## 2015-06-01 DIAGNOSIS — I2510 Atherosclerotic heart disease of native coronary artery without angina pectoris: Secondary | ICD-10-CM

## 2015-06-01 DIAGNOSIS — I4891 Unspecified atrial fibrillation: Secondary | ICD-10-CM

## 2015-06-01 MED ORDER — BISOPROLOL FUMARATE 10 MG PO TABS: 10.0000 mg | ORAL_TABLET | Freq: Every day | ORAL | Status: DC

## 2015-06-01 MED ORDER — LOSARTAN POTASSIUM 50 MG PO TABS: 50.0000 mg | ORAL_TABLET | Freq: Every day | ORAL | Status: DC

## 2015-06-01 NOTE — Patient Instructions (Addendum)
Medication Instructions:  Your physician has recommended you make the following change in your medication:  DECREASE losartan to 1/2 tablet once per day (50mg ) START taking bisoprolol 10mg  once per day   Labwork: none  Testing/Procedures: none  Follow-Up: Your physician recommends that you schedule a follow-up appointment in: one month with Dr. Fletcher Anon.    Any Other Special Instructions Will Be Listed Below (If Applicable).

## 2015-06-01 NOTE — Progress Notes (Signed)
Primary care physician: Dr. Ezequiel Kayser  HPI  This is a pleasant 80 year old female who is here today for a follow-up visit regarding chronic systolic heart failure, atrial fibrillation and mitral regurgitation.  She has known history of COPD, breast cancer status post right partial mastectomy, hypothyroidism, hyperlipidemia and Mnire disease. She was hospitalized in February at [facility] for worsening shortness of breath, palpitations and orthopnea. She was noted to be in atrial fibrillation with rapid ventricular response with heart rate in the 130 range. There was evidence of fluid overload with BNP of 2500. She underwent an echocardiogram which showed an ejection fraction of 35-40% with anteroseptal hypokinesis, moderately dilated left atrium, small pericardial effusion and moderate to severe mitral regurgitation. She was treated with rate control and diuresis with subsequent improvement. She underwent a right and left cardiac catheterization which showed moderately elevated filling pressures with giant V waves suggestive of significant mitral regurgitation. The coronary arteries were heavily calcified with moderate stenosis in the proximal to mid LAD. I proceeded with TEE before cardioversion in March which showed no significant change in ejection fraction and valvular abnormalities. There was moderate tricuspid regurgitation with moderate pulmonary hypertension. The mitral valve appeared degenerative with no significant prolapse. Mitral regurgitation was moderate to severe.  Repeat Echo on optimal medical therapy showed actually improvement in LV systolic function to an ejection fraction of 50-55%. The left atrium was moderately to severely dilated with evidence of severe mitral regurgitation with a posterior jet. There was also associated severe pulmonary hypertension with an estimated systolic pulmonary pressure of 72 mmHg. She underwent mitral valve replacement with a bioprosthetic valve, tricuspid  valve repair, one-vessel CABG with LIMA to LAD and maze procedure in August 2016. This was complicated by sternal wound infection which required debridement. She also had complete heart block and underwent permanent pacemaker placement. She was discharged to rehabilitation. She is still very weak and complains of dyspnea and occasional wheezing. She is still noted to be in atrial fibrillation.  Allergies  Allergen Reactions  . Augmentin [Amoxicillin-Pot Clavulanate] Swelling    Swollen joints, pain  . Nifedipine     Other reaction(s): Unknown  . Penicillins     Other reaction(s): Other (See Comments) Swollen joints  . Propranolol     Other reaction(s): Unknown  . Ace Inhibitors     Other reaction(s): Cough  . Diazepam     Other reaction(s): Other (See Comments) Made her "hyper"  . Meperidine     Other reaction(s): Other (See Comments) Made her "hyper"  . Propoxyphene     Other reaction(s): Other (See Comments) Made her "hyper"     Current Outpatient Prescriptions on File Prior to Visit  Medication Sig Dispense Refill  . acetaminophen (TYLENOL) 325 MG tablet Take 2 tablets (650 mg total) by mouth every 4 (four) hours as needed for mild pain (or Fever >/= 101).    Marland Kitchen amiodarone (PACERONE) 200 MG tablet TAKE 1 TABLET TWICE A DAY 60 tablet 0  . aspirin EC 81 MG EC tablet Take 1 tablet (81 mg total) by mouth daily.    Marland Kitchen atorvastatin (LIPITOR) 10 MG tablet Take 1 tablet (10 mg total) by mouth daily at 6 PM.    . benzonatate (TESSALON) 100 MG capsule Take 100 mg by mouth 3 (three) times daily as needed for cough (for cold).     Marland Kitchen docusate sodium (COLACE) 100 MG capsule Take 2 capsules (200 mg total) by mouth 2 (two) times daily as needed for mild  constipation. 10 capsule 0  . ferrous sulfate 325 (65 FE) MG tablet Take 1 tablet (325 mg total) by mouth daily with breakfast.  3  . fluticasone (FLONASE) 50 MCG/ACT nasal spray Place 1 spray into both nostrils daily as needed for allergies.       . furosemide (LASIX) 20 MG tablet Take 20 mg by mouth 2 (two) times daily.     . Indacaterol Maleate 75 MCG CAPS Place into inhaler and inhale daily.    Marland Kitchen ipratropium-albuterol (DUONEB) 0.5-2.5 (3) MG/3ML SOLN Take 3 mLs by nebulization every 6 (six) hours as needed (for shortness of breath).     Marland Kitchen levothyroxine (SYNTHROID, LEVOTHROID) 88 MCG tablet Take 88 mcg by mouth daily before breakfast.    . Multiple Vitamins-Minerals (PRESERVISION AREDS PO) Take 2 tablets by mouth daily.     Marland Kitchen oxyCODONE (OXY IR/ROXICODONE) 5 MG immediate release tablet Take 1-2 tablets (5-10 mg total) by mouth every 3 (three) hours as needed for severe pain. 30 tablet 0  . pantoprazole (PROTONIX) 40 MG tablet Take 40 mg by mouth daily.    . potassium chloride SA (K-DUR,KLOR-CON) 20 MEQ tablet Take 1 tablet (20 mEq total) by mouth daily.    Marland Kitchen spironolactone (ALDACTONE) 25 MG tablet Take 1 tablet (25 mg total) by mouth daily. 30 tablet 6  . traMADol (ULTRAM) 50 MG tablet Take 1-2 tablets (50-100 mg total) by mouth every 4 (four) hours as needed for moderate pain. 30 tablet 0  . traZODone (DESYREL) 50 MG tablet Take 50 mg by mouth at bedtime as needed for sleep.    Marland Kitchen warfarin (COUMADIN) 3 MG tablet Take 1 tablet (3 mg total) by mouth daily at 6 PM.     No current facility-administered medications on file prior to visit.     Past Medical History  Diagnosis Date  . Hypertension   . COPD (chronic obstructive pulmonary disease)   . Hyperlipidemia   . Meniere disease   . HX: breast cancer   . Acute CHF (congestive heart failure)   . Atrial fibrillation   . Mitral regurgitation   . COPD (chronic obstructive pulmonary disease)   . Heart murmur     as child  . Hypothyroidism   . Cancer     hx breast cancer  . Breast cancer 08/09/2001    right/rad  . Pulmonary hypertension   . Tricuspid regurgitation   . Chronic combined systolic and diastolic CHF (congestive heart failure)   . Coronary artery disease   . PONV  (postoperative nausea and vomiting)   . Shortness of breath dyspnea     on exertion  . Anxiety   . GERD (gastroesophageal reflux disease)   . Arthritis   . s/p mitral valve replacement with bioprosthetic tissue valve 04/25/2015    27 mm [facility] Mitral bovine bioprosthetic tissue valve  . 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 S/P Maze operation for atrial fibrillation 04/25/2015    Complete bilateral atrial lesion set using cryothermy and bipolar radiofrequency ablation with clipping of LA appendage  . S/P CABG x 1 04/25/2015    LIMA to LAD  . Post-surgical complete heart block, symptomatic 05/03/2015     Past Surgical History  Procedure Laterality Date  . Cochlear implant Bilateral   . Mastectomy, partial Right   . Cardiac catheterization  11/2013    [facility]  . Cardiac catheterization  10/2014    [facility]  .  Breast biopsy Left 11/25/06    neg  . Breast biopsy Left 01/20/12    lt bx /clip-neg  . Eye surgery Bilateral 2014    cataract surgery  . Mitral valve repair N/A 04/25/2015    Procedure: MITRAL VALVE  REPLACEMENT using a 27 mm Edwards Perimount Magna Mitral Ease Valve;  Surgeon: Rexene Alberts, MD;  Location: [place];  Service: Open Heart Surgery;  Laterality: N/A;  . Tricuspid valve replacement N/A 04/25/2015    Procedure: TRICUSPID VALVE REPAIR;  Surgeon: Rexene Alberts, MD;  Location: [place];  Service: Open Heart Surgery;  Laterality: N/A;  . Maze N/A 04/25/2015    Procedure: MAZE;  Surgeon: Rexene Alberts, MD;  Location: [place];  Service: Open Heart Surgery;  Laterality: N/A;  . Tee without cardioversion N/A 04/25/2015    Procedure: TRANSESOPHAGEAL ECHOCARDIOGRAM (TEE);  Surgeon: Rexene Alberts, MD;  Location: [place];  Service: Open Heart Surgery;  Laterality: N/A;  . Clipping of atrial appendage N/A 04/25/2015    Procedure: CLIPPING OF ATRIAL APPENDAGE;  Surgeon: Rexene Alberts, MD;  Location: [place];  Service: Open Heart Surgery;   Laterality: N/A;  . Ep implantable device N/A 05/02/2015    Procedure: Pacemaker Implant;  Surgeon: Thompson Grayer, MD;  Location: [place] [facility];  Service: Cardiovascular;  Laterality: N/A;  . Sternal wound debridement N/A 05/09/2015    Procedure: STERNAL WOUND DEBRIDEMENT;  Surgeon: Rexene Alberts, MD;  Location: [place];  Service: Thoracic;  Laterality: N/A;  . Application of wound vac N/A 05/09/2015    Procedure: APPLICATION OF WOUND VAC;  Surgeon: Rexene Alberts, MD;  Location: [facility];  Service: Thoracic;  Laterality: N/A;  . Coronary artery bypass graft N/A 04/25/2015    Procedure: CORONARY ARTERY BYPASS GRAFTING (CABG) x ONE, using left internal mammary artery;  Surgeon: Rexene Alberts, MD;  Location: [place];  Service: Open Heart Surgery;  Laterality: N/A;     Family History  Problem Relation Age of Onset  . Heart disease Father   . Heart disease Brother   . Heart attack Paternal Uncle      Social History   Social History  . Marital Status: Married    Spouse Name: N/A  . Number of Children: N/A  . Years of Education: N/A   Occupational History  . Not on file.   Social History Main Topics  . Smoking status: Former Smoker -- 40 years    Types: Cigarettes    Quit date: 04/20/1998  . Smokeless tobacco: Not on file  . Alcohol Use: No  . Drug Use: No  . Sexual Activity: Not on file   Other Topics Concern  . Not on file   Social History Narrative      PHYSICAL EXAM   BP 130/70 mmHg  Pulse 103  Ht 5\' 1"  (1.549 m)  Wt 134 lb 8 oz (61.009 kg)  BMI 25.43 kg/m2 Constitutional: She is oriented to person, place, and time. She appears well-developed and well-nourished. No distress.  HENT: No nasal discharge.  Head: Normocephalic and atraumatic.  Eyes: Pupils are equal and round. No discharge.  Neck: Normal range of motion. Neck supple. Mild JVD present. No thyromegaly present.  Cardiovascular: Bradycardic, regular rhythm, normal heart sounds. Exam reveals no gallop  and no friction rub. No murmurs. Pulmonary/Chest: Effort normal and breath sounds normal. No stridor. No respiratory distress. She has no wheezes. She has no rales. She exhibits no tenderness.  Abdominal:  Soft. Bowel sounds are normal. She exhibits no distension. There is no tenderness. There is no rebound and no guarding.  Musculoskeletal: Normal range of motion. She exhibits no edema and no tenderness.  Neurological: She is alert and oriented to person, place, and time. Coordination normal.  Skin: Skin is warm and dry. No rash noted. She is not diaphoretic. No erythema. No pallor.  Psychiatric: She has a normal mood and affect. Her behavior is normal. Judgment and thought content normal.     EKG: Atrial fibrillation with rapid ventricular response with a heart rate of 103 bpm  ASSESSMENT AND PLAN

## 2015-06-01 NOTE — Progress Notes (Signed)
Pt SDW-pre-op call completed by pt and pt spouse, [name] Pt made aware to stop otc vitamins, NSAID's and herbal medications. According to pt spouse, pt will not take Coumadin from Friday to DOS ( per MD instruction ) . Spoke with Manuela Schwartz, RN at MD's office to clarify if pt should stop Aspirin. According, to Manuela Schwartz,  it is okay for pt to continue Aspirin, " pt was given instructions on what to take "  Pt denies SOB and chest pain but C/O congestion. Pt spouse verbalized understanding of all pre-op instructions.

## 2015-06-02 DIAGNOSIS — Z48812 Encounter for surgical aftercare following surgery on the circulatory system: Secondary | ICD-10-CM | POA: Diagnosis not present

## 2015-06-02 NOTE — Progress Notes (Signed)
Anesthesia Chart Review: SAME DAY WORK-UP.  Patient is a 80 year old female scheduled for sternal wire removal on 06/05/15 by Dr. Roxy Manns. She is s/p MV replacement (27 mm, bioprosthetic), TV repair (26 mm annuloplasty ring), CABG, Maze procedure, CABG X 1 (LIMA to LAD) on 04/25/15. Post-operative course was complicated by sternal wound s/p debridement and wound VAC 05/09/15 and CHB s/p Medtronic Adapta L dual chamber PPM on 05/02/15 (Dr. Rayann Heman).    Other history includes former smoker, post-operative N/V, HTN, COPD, HLD, breast cancer s/p right partial mastectomy, CAD, afib s/p DCCV 11/2014 and MAZE 04/25/15, chronic combined CHF, severe MR and moderate TR s/p MV replacement and TV repair 04/25/15, CAD, pulmonary hypertension, SOB, anxiety, GERD, hypothyroidism, hearing impaired s/p cochlear implant.  PCP is Dr. Ezequiel Kayser with [facility] (see Care Everywhere). Cardiologist is Dr. Fletcher Anon, last visit 06/01/15. She is scheduled to see EP cardiologist Dr. Caryl Comes in their [facility] office on 08/01/15. Pulmonologist is Dr. Raul Del with [facility] (see Care Everywhere).  Med list noted. She is holding warfarin after her 06/01/15 dose.  Dr. Tyrell Antonio 06/01/15 notes documents, "EKG: Sinus Bradycardia-Left axis -anterior fascicular block." Tracing is not yet in Epic. 05/03/15 EKG showed v-paced rhythm with underlying afib at 60 bpm. Consider repeating EKG pre-operative to evaluate for underlying rhythm if 06/01/15 is still not viewable in Epic.    04/25/15 Intra-op TEE findings as reported via Dr. Guy Sandifer op note:  Followup transesophageal echocardiogram performed after separation from bypass revealed a well-seated mitral valve prosthesis that was functioning normally and without any sign of perivalvular leak. Left ventricular function was unchanged from preoperatively. The tricuspid valve was difficult to visualize but there was no sign of any residual tricuspid regurgitation and the annuloplasty ring appeared to be well seated.  No other abnormalities were noted.  11/07/14 Cardiac cath (PRE-CABG/MVR/TVR): Calcified coronary arteries without obstructive disease. 30% proximal LAD. 20% mid LAD. Dr. Roxy Manns felt there was 50-70% LAD involving trifurcation with a diagonal branch. Luminal irregularities D1, D2, D3, CX, OM1, OM2, OM3, RPLA. 20% proximal RCA, 30% mid RCA, 40% ostial RPDA Global LV function was moderately depressed, EF estimated at 40%. Moderate to severe MR. Pulmonary artery pressures were reported 50/28 mmHg with mean pulmonary capillary wedge pressure 26 mmHg and large V waves of 49 mmHg. Mean central venous pressure was 18 mmHg.  04/21/15 Carotid duplex: Summary: - Mild technical difficulty due to tortuosity in the distal regions. - Right - 1% to 39% ICA stenosis. ECA stenosis at the origin. Vertebral artery flow is antegrade. - Left - 1% to 39% ICA stenosis. Plaque formation does not support the elevated velocity in the distal ICA. Probably secondary to tortuosity. Vertebral artey flow is antegrade.  03/17/15 PFTs: FVC 1.92 (80%), FEV1 1.20 (67%), DLCOunc 7.39 (35%).   She is for labs on arrival. Case is posted for MAC anesthesia.   [facility] Short Stay Center/Anesthesiology Phone [phone] 06/02/2015 5:17 PM

## 2015-06-04 NOTE — Assessment & Plan Note (Signed)
She currently appears to be euvolemic. I resumed bisoprolol.

## 2015-06-04 NOTE — Assessment & Plan Note (Signed)
She had one-vessel CABG and currently with no anginal symptoms.

## 2015-06-04 NOTE — Assessment & Plan Note (Signed)
The patient continues to be in atrial fibrillation postoperatively. She is currently on amiodarone. She is on anticoagulation with warfarin. She prefers to be on Eliquis. This might be considered in the future as an off label use given that technically she has valvular heart disease. Nonetheless, there is no mitral stenosis. It might be reasonable to treat with warfarin for 6 months and then consider switching to Eliquis.  I resumed treatment with bisoprolol to control her heart rate.

## 2015-06-05 ENCOUNTER — Ambulatory Visit (HOSPITAL_COMMUNITY)
Admission: RE | Admit: 2015-06-05 | Discharge: 2015-06-05 | Disposition: A | Discharge status: Home / Self Care | Payer: Medicare PPO | Source: Ambulatory Visit | Attending: Thoracic Surgery (Cardiothoracic Vascular Surgery) | Admitting: Thoracic Surgery (Cardiothoracic Vascular Surgery)

## 2015-06-05 ENCOUNTER — Ambulatory Visit (HOSPITAL_COMMUNITY): Payer: Medicare PPO | Admitting: Vascular Surgery

## 2015-06-05 ENCOUNTER — Encounter (HOSPITAL_COMMUNITY)
Admission: RE | Disposition: A | Discharge status: Home / Self Care | Payer: Self-pay | Source: Ambulatory Visit | Attending: Thoracic Surgery (Cardiothoracic Vascular Surgery)

## 2015-06-05 ENCOUNTER — Encounter (HOSPITAL_COMMUNITY): Payer: Self-pay | Admitting: Certified Registered Nurse Anesthetist

## 2015-06-05 DIAGNOSIS — I10 Essential (primary) hypertension: Secondary | ICD-10-CM | POA: Diagnosis not present

## 2015-06-05 DIAGNOSIS — J449 Chronic obstructive pulmonary disease, unspecified: Secondary | ICD-10-CM | POA: Insufficient documentation

## 2015-06-05 DIAGNOSIS — Z880 Allergy status to penicillin: Secondary | ICD-10-CM | POA: Insufficient documentation

## 2015-06-05 DIAGNOSIS — F419 Anxiety disorder, unspecified: Secondary | ICD-10-CM | POA: Insufficient documentation

## 2015-06-05 DIAGNOSIS — E039 Hypothyroidism, unspecified: Secondary | ICD-10-CM | POA: Insufficient documentation

## 2015-06-05 DIAGNOSIS — Z952 Presence of prosthetic heart valve: Secondary | ICD-10-CM | POA: Insufficient documentation

## 2015-06-05 DIAGNOSIS — T859XXA Unspecified complication of internal prosthetic device, implant and graft, initial encounter: Secondary | ICD-10-CM | POA: Insufficient documentation

## 2015-06-05 DIAGNOSIS — Z853 Personal history of malignant neoplasm of breast: Secondary | ICD-10-CM | POA: Insufficient documentation

## 2015-06-05 DIAGNOSIS — I2510 Atherosclerotic heart disease of native coronary artery without angina pectoris: Secondary | ICD-10-CM | POA: Insufficient documentation

## 2015-06-05 DIAGNOSIS — K219 Gastro-esophageal reflux disease without esophagitis: Secondary | ICD-10-CM | POA: Diagnosis not present

## 2015-06-05 DIAGNOSIS — Z7982 Long term (current) use of aspirin: Secondary | ICD-10-CM | POA: Diagnosis not present

## 2015-06-05 DIAGNOSIS — T8132XA Disruption of internal operation (surgical) wound, not elsewhere classified, initial encounter: Secondary | ICD-10-CM

## 2015-06-05 DIAGNOSIS — Z79899 Other long term (current) drug therapy: Secondary | ICD-10-CM | POA: Insufficient documentation

## 2015-06-05 DIAGNOSIS — I5042 Chronic combined systolic (congestive) and diastolic (congestive) heart failure: Secondary | ICD-10-CM | POA: Diagnosis not present

## 2015-06-05 DIAGNOSIS — Z87891 Personal history of nicotine dependence: Secondary | ICD-10-CM | POA: Diagnosis not present

## 2015-06-05 DIAGNOSIS — Y831 Surgical operation with implant of artificial internal device as the cause of abnormal reaction of the patient, or of later complication, without mention of misadventure at the time of the procedure: Secondary | ICD-10-CM | POA: Insufficient documentation

## 2015-06-05 DIAGNOSIS — Z7901 Long term (current) use of anticoagulants: Secondary | ICD-10-CM | POA: Diagnosis not present

## 2015-06-05 DIAGNOSIS — I081 Rheumatic disorders of both mitral and tricuspid valves: Secondary | ICD-10-CM | POA: Diagnosis not present

## 2015-06-05 DIAGNOSIS — I4891 Unspecified atrial fibrillation: Secondary | ICD-10-CM | POA: Insufficient documentation

## 2015-06-05 DIAGNOSIS — T82898A Other specified complication of vascular prosthetic devices, implants and grafts, initial encounter: Secondary | ICD-10-CM | POA: Diagnosis not present

## 2015-06-05 DIAGNOSIS — M1990 Unspecified osteoarthritis, unspecified site: Secondary | ICD-10-CM | POA: Insufficient documentation

## 2015-06-05 DIAGNOSIS — Z881 Allergy status to other antibiotic agents status: Secondary | ICD-10-CM | POA: Insufficient documentation

## 2015-06-05 DIAGNOSIS — I272 Other secondary pulmonary hypertension: Secondary | ICD-10-CM | POA: Insufficient documentation

## 2015-06-05 DIAGNOSIS — E785 Hyperlipidemia, unspecified: Secondary | ICD-10-CM | POA: Diagnosis not present

## 2015-06-05 DIAGNOSIS — Z888 Allergy status to other drugs, medicaments and biological substances status: Secondary | ICD-10-CM | POA: Diagnosis not present

## 2015-06-05 DIAGNOSIS — Z951 Presence of aortocoronary bypass graft: Secondary | ICD-10-CM | POA: Insufficient documentation

## 2015-06-05 DIAGNOSIS — H8109 Meniere's disease, unspecified ear: Secondary | ICD-10-CM | POA: Insufficient documentation

## 2015-06-05 HISTORY — PX: STERNAL WIRES REMOVAL: SHX2441

## 2015-06-05 HISTORY — DX: Presence of cardiac pacemaker: Z95.0

## 2015-06-05 LAB — COMPREHENSIVE METABOLIC PANEL
ALBUMIN: 3.1 g/dL — AB (ref 3.5–5.0)
ALT: 19 U/L (ref 14–54)
ANION GAP: 7 (ref 5–15)
AST: 18 U/L (ref 15–41)
Alkaline Phosphatase: 77 U/L (ref 38–126)
BUN: 16 mg/dL (ref 6–20)
CALCIUM: 9.1 mg/dL (ref 8.9–10.3)
CHLORIDE: 102 mmol/L (ref 101–111)
CO2: 30 mmol/L (ref 22–32)
Creatinine, Ser: 0.85 mg/dL (ref 0.44–1.00)
GFR calc non Af Amer: >60 mL/min (ref >60)
GLUCOSE: 118 mg/dL — AB (ref 65–99)
POTASSIUM: 4.1 mmol/L (ref 3.5–5.1)
SODIUM: 139 mmol/L (ref 135–145)
Total Bilirubin: 0.3 mg/dL (ref 0.3–1.2)
Total Protein: 6.3 g/dL — ABNORMAL LOW (ref 6.5–8.1)

## 2015-06-05 LAB — CBC
HCT: 29.2 % — ABNORMAL LOW (ref 36.0–46.0)
Hemoglobin: 8.9 g/dL — ABNORMAL LOW (ref 12.0–15.0)
MCH: 29.6 pg (ref 26.0–34.0)
MCHC: 30.5 g/dL (ref 30.0–36.0)
MCV: 97 fL (ref 78.0–100.0)
PLATELETS: 294 10*3/uL (ref 150–400)
RBC: 3.01 MIL/uL — ABNORMAL LOW (ref 3.87–5.11)
RDW: 17.2 % — AB (ref 11.5–15.5)
WBC: 11.7 10*3/uL — ABNORMAL HIGH (ref 4.0–10.5)

## 2015-06-05 LAB — PROTIME-INR
INR: 1.39 (ref 0.00–1.49)
PROTHROMBIN TIME: 17.2 s — AB (ref 11.6–15.2)

## 2015-06-05 LAB — APTT: APTT: 30 s (ref 24–37)

## 2015-06-05 SURGERY — REMOVAL, STERNAL WIRE
Anesthesia: Monitor Anesthesia Care | Site: Chest

## 2015-06-05 MED ORDER — INDACATEROL MALEATE 75 MCG IN CAPS: ORAL_CAPSULE | Freq: Every day | RESPIRATORY_TRACT | Status: DC

## 2015-06-05 MED ORDER — ROCURONIUM BROMIDE 50 MG/5ML IV SOLN
INTRAVENOUS | Status: AC
  Filled 2015-06-05: qty 1

## 2015-06-05 MED ORDER — MEPERIDINE HCL 25 MG/ML IJ SOLN: 6.2500 mg | INTRAMUSCULAR | Status: DC | PRN

## 2015-06-05 MED ORDER — FENTANYL CITRATE (PF) 100 MCG/2ML IJ SOLN
INTRAMUSCULAR | Status: DC | PRN
  Administered 2015-06-05 (×2): 25 ug via INTRAVENOUS

## 2015-06-05 MED ORDER — AMIODARONE HCL 200 MG PO TABS: 200.0000 mg | ORAL_TABLET | Freq: Two times a day (BID) | ORAL | Status: DC

## 2015-06-05 MED ORDER — OXYCODONE HCL 5 MG PO TABS: 5.0000 mg | ORAL_TABLET | ORAL | Status: DC | PRN

## 2015-06-05 MED ORDER — FUROSEMIDE 20 MG PO TABS: 20.0000 mg | ORAL_TABLET | Freq: Two times a day (BID) | ORAL | Status: DC

## 2015-06-05 MED ORDER — PROPOFOL 500 MG/50ML IV EMUL
INTRAVENOUS | Status: DC | PRN
  Administered 2015-06-05: 50 ug/kg/min via INTRAVENOUS

## 2015-06-05 MED ORDER — POTASSIUM CHLORIDE CRYS ER 20 MEQ PO TBCR: 20.0000 meq | EXTENDED_RELEASE_TABLET | Freq: Every day | ORAL | Status: DC

## 2015-06-05 MED ORDER — ONDANSETRON HCL 4 MG/2ML IJ SOLN: 4.0000 mg | Freq: Once | INTRAMUSCULAR | Status: DC | PRN

## 2015-06-05 MED ORDER — EPHEDRINE SULFATE 50 MG/ML IJ SOLN
INTRAMUSCULAR | Status: AC
  Filled 2015-06-05: qty 1

## 2015-06-05 MED ORDER — SODIUM CHLORIDE 0.9 % IV SOLN: 250.0000 mL | INTRAVENOUS | Status: DC | PRN

## 2015-06-05 MED ORDER — LIDOCAINE HCL (CARDIAC) 20 MG/ML IV SOLN
INTRAVENOUS | Status: DC | PRN
  Administered 2015-06-05: 60 mg via INTRAVENOUS

## 2015-06-05 MED ORDER — SODIUM CHLORIDE 0.9 % IJ SOLN
OROMUCOSAL | Status: DC | PRN
  Administered 2015-06-05: 4 mL via TOPICAL

## 2015-06-05 MED ORDER — FENTANYL CITRATE (PF) 250 MCG/5ML IJ SOLN
INTRAMUSCULAR | Status: AC
  Filled 2015-06-05: qty 5

## 2015-06-05 MED ORDER — SPIRONOLACTONE 25 MG PO TABS: 25.0000 mg | ORAL_TABLET | Freq: Every day | ORAL | Status: DC

## 2015-06-05 MED ORDER — ONDANSETRON HCL 4 MG/2ML IJ SOLN
INTRAMUSCULAR | Status: AC
  Filled 2015-06-05: qty 2

## 2015-06-05 MED ORDER — ONDANSETRON HCL 4 MG/2ML IJ SOLN
INTRAMUSCULAR | Status: DC | PRN
  Administered 2015-06-05: 4 mg via INTRAVENOUS

## 2015-06-05 MED ORDER — PHENYLEPHRINE 40 MCG/ML (10ML) SYRINGE FOR IV PUSH (FOR BLOOD PRESSURE SUPPORT)
PREFILLED_SYRINGE | INTRAVENOUS | Status: AC
  Filled 2015-06-05: qty 10

## 2015-06-05 MED ORDER — PANTOPRAZOLE SODIUM 40 MG PO TBEC: 40.0000 mg | DELAYED_RELEASE_TABLET | Freq: Every day | ORAL | Status: DC

## 2015-06-05 MED ORDER — ACETAMINOPHEN 500 MG PO TABS: 1000.0000 mg | ORAL_TABLET | Freq: Four times a day (QID) | ORAL | Status: DC

## 2015-06-05 MED ORDER — LEVOTHYROXINE SODIUM 88 MCG PO TABS
88.0000 ug | ORAL_TABLET | Freq: Every day | ORAL | Status: DC
Start: 2015-06-05 — End: 2015-06-05

## 2015-06-05 MED ORDER — PROPOFOL 10 MG/ML IV BOLUS
INTRAVENOUS | Status: AC
  Filled 2015-06-05: qty 20

## 2015-06-05 MED ORDER — TRAMADOL HCL 50 MG PO TABS: 50.0000 mg | ORAL_TABLET | ORAL | Status: DC | PRN

## 2015-06-05 MED ORDER — ACETAMINOPHEN 650 MG RE SUPP: 650.0000 mg | RECTAL | Status: DC | PRN

## 2015-06-05 MED ORDER — SODIUM CHLORIDE 0.9 % IR SOLN
Status: DC | PRN
  Administered 2015-06-05: 2000 mL

## 2015-06-05 MED ORDER — TRAZODONE HCL 50 MG PO TABS: 50.0000 mg | ORAL_TABLET | Freq: Every evening | ORAL | Status: DC | PRN

## 2015-06-05 MED ORDER — IPRATROPIUM-ALBUTEROL 0.5-2.5 (3) MG/3ML IN SOLN: 3.0000 mL | Freq: Four times a day (QID) | RESPIRATORY_TRACT | Status: DC | PRN

## 2015-06-05 MED ORDER — SODIUM CHLORIDE 0.9 % IJ SOLN
INTRAMUSCULAR | Status: AC
  Filled 2015-06-05: qty 10

## 2015-06-05 MED ORDER — LACTATED RINGERS IV SOLN
INTRAVENOUS | Status: DC | PRN
  Administered 2015-06-05: 07:00:00 via INTRAVENOUS

## 2015-06-05 MED ORDER — FERROUS SULFATE 325 (65 FE) MG PO TABS: 325.0000 mg | ORAL_TABLET | Freq: Every day | ORAL | Status: DC

## 2015-06-05 MED ORDER — WARFARIN SODIUM 3 MG PO TABS: 3.0000 mg | ORAL_TABLET | Freq: Every day | ORAL | Status: DC

## 2015-06-05 MED ORDER — PRESERVISION AREDS PO TABS: ORAL_TABLET | Freq: Every day | ORAL | Status: DC

## 2015-06-05 MED ORDER — DOCUSATE SODIUM 100 MG PO CAPS: 200.0000 mg | ORAL_CAPSULE | Freq: Two times a day (BID) | ORAL | Status: DC | PRN

## 2015-06-05 MED ORDER — FLUTICASONE PROPIONATE 50 MCG/ACT NA SUSP: 1.0000 | Freq: Every day | NASAL | Status: DC | PRN

## 2015-06-05 MED ORDER — SODIUM CHLORIDE 0.9 % IJ SOLN: 3.0000 mL | INTRAMUSCULAR | Status: DC | PRN

## 2015-06-05 MED ORDER — HYDROMORPHONE HCL 1 MG/ML IJ SOLN: 0.2500 mg | INTRAMUSCULAR | Status: DC | PRN

## 2015-06-05 MED ORDER — BENZONATATE 100 MG PO CAPS: 100.0000 mg | ORAL_CAPSULE | Freq: Three times a day (TID) | ORAL | Status: DC | PRN

## 2015-06-05 MED ORDER — ATORVASTATIN CALCIUM 10 MG PO TABS: 10.0000 mg | ORAL_TABLET | Freq: Every day | ORAL | Status: DC

## 2015-06-05 MED ORDER — VANCOMYCIN HCL IN DEXTROSE 1-5 GM/200ML-% IV SOLN
1000.0000 mg | INTRAVENOUS | Status: AC
  Administered 2015-06-05: 1000 mg via INTRAVENOUS
  Filled 2015-06-05: qty 200

## 2015-06-05 MED ORDER — ASPIRIN EC 81 MG PO TBEC: 81.0000 mg | DELAYED_RELEASE_TABLET | Freq: Every day | ORAL | Status: DC

## 2015-06-05 MED ORDER — FENTANYL CITRATE (PF) 100 MCG/2ML IJ SOLN: 25.0000 ug | INTRAMUSCULAR | Status: DC | PRN

## 2015-06-05 MED ORDER — BISOPROLOL FUMARATE 10 MG PO TABS: 10.0000 mg | ORAL_TABLET | Freq: Every day | ORAL | Status: DC

## 2015-06-05 MED ORDER — ACETAMINOPHEN 325 MG PO TABS: 650.0000 mg | ORAL_TABLET | ORAL | Status: DC | PRN

## 2015-06-05 MED ORDER — SODIUM CHLORIDE 0.9 % IJ SOLN: 3.0000 mL | Freq: Two times a day (BID) | INTRAMUSCULAR | Status: DC

## 2015-06-05 MED ORDER — MIDAZOLAM HCL 5 MG/5ML IJ SOLN
INTRAMUSCULAR | Status: DC | PRN
  Administered 2015-06-05: .5 mg via INTRAVENOUS

## 2015-06-05 MED ORDER — LOSARTAN POTASSIUM 50 MG PO TABS: 50.0000 mg | ORAL_TABLET | Freq: Every day | ORAL | Status: DC

## 2015-06-05 MED ORDER — MIDAZOLAM HCL 2 MG/2ML IJ SOLN
INTRAMUSCULAR | Status: AC
  Filled 2015-06-05: qty 4

## 2015-06-05 SURGICAL SUPPLY — 46 items
ATTRACTOMAT 16X20 MAGNETIC DRP (DRAPES) ×2 IMPLANT
BAG DECANTER FOR FLEXI CONT (MISCELLANEOUS) ×2 IMPLANT
BLADE SURG 10 STRL SS (BLADE) ×4 IMPLANT
BNDG GAUZE ELAST 4 BULKY (GAUZE/BANDAGES/DRESSINGS) IMPLANT
CANISTER SUCTION 2500CC (MISCELLANEOUS) ×2 IMPLANT
CATH THORACIC 28FR RT ANG (CATHETERS) IMPLANT
CATH THORACIC 36FR (CATHETERS) IMPLANT
CATH THORACIC 36FR RT ANG (CATHETERS) IMPLANT
CLIP TI WIDE RED SMALL 24 (CLIP) IMPLANT
CONT SPEC 4OZ CLIKSEAL STRL BL (MISCELLANEOUS) IMPLANT
DRAPE LAPAROSCOPIC ABDOMINAL (DRAPES) ×2 IMPLANT
DRAPE WARM FLUID 44X44 (DRAPE) IMPLANT
ELECT REM PT RETURN 9FT ADLT (ELECTROSURGICAL) ×2
ELECTRODE REM PT RTRN 9FT ADLT (ELECTROSURGICAL) ×1 IMPLANT
GAUZE SPONGE 4X4 12PLY STRL (GAUZE/BANDAGES/DRESSINGS) ×2 IMPLANT
GAUZE XEROFORM 5X9 LF (GAUZE/BANDAGES/DRESSINGS) IMPLANT
GLOVE ORTHO TXT STRL SZ7.5 (GLOVE) ×2 IMPLANT
GOWN STRL REUS W/ TWL LRG LVL3 (GOWN DISPOSABLE) ×4 IMPLANT
GOWN STRL REUS W/TWL LRG LVL3 (GOWN DISPOSABLE) ×4
HANDPIECE INTERPULSE COAX TIP (DISPOSABLE) ×1
HEMOSTAT POWDER SURGIFOAM 1G (HEMOSTASIS) IMPLANT
KIT BASIN OR (CUSTOM PROCEDURE TRAY) ×2 IMPLANT
KIT ROOM TURNOVER OR (KITS) ×2 IMPLANT
KIT SUCTION CATH 14FR (SUCTIONS) IMPLANT
NS IRRIG 1000ML POUR BTL (IV SOLUTION) ×2 IMPLANT
PACK CHEST (CUSTOM PROCEDURE TRAY) ×2 IMPLANT
PAD ARMBOARD 7.5X6 YLW CONV (MISCELLANEOUS) ×4 IMPLANT
SET HNDPC FAN SPRY TIP SCT (DISPOSABLE) ×1 IMPLANT
SOLUTION BETADINE 4OZ (MISCELLANEOUS) IMPLANT
SPONGE GAUZE 4X4 12PLY STER LF (GAUZE/BANDAGES/DRESSINGS) ×2 IMPLANT
SPONGE LAP 18X18 X RAY DECT (DISPOSABLE) ×2 IMPLANT
SUT ETHILON 3 0 FSL (SUTURE) IMPLANT
SUT MNCRL AB 3-0 PS2 18 (SUTURE) ×4 IMPLANT
SUT PDS AB 1 CTX 36 (SUTURE) ×2 IMPLANT
SUT STEEL 6MS V (SUTURE) IMPLANT
SUT STEEL STERNAL CCS#1 18IN (SUTURE) IMPLANT
SUT STEEL SZ 6 DBL 3X14 BALL (SUTURE) IMPLANT
SUT VIC AB 2-0 CTX 27 (SUTURE) IMPLANT
SWAB COLLECTION DEVICE MRSA (MISCELLANEOUS) IMPLANT
SYSTEM SAHARA CHEST DRAIN ATS (WOUND CARE) ×2 IMPLANT
TAPE CLOTH SURG 4X10 WHT LF (GAUZE/BANDAGES/DRESSINGS) ×2 IMPLANT
TOWEL OR 17X24 6PK STRL BLUE (TOWEL DISPOSABLE) ×2 IMPLANT
TOWEL OR 17X26 10 PK STRL BLUE (TOWEL DISPOSABLE) ×2 IMPLANT
TRAY FOLEY IC TEMP SENS 14FR (CATHETERS) ×2 IMPLANT
TUBE ANAEROBIC SPECIMEN COL (MISCELLANEOUS) IMPLANT
WATER STERILE IRR 1000ML POUR (IV SOLUTION) ×2 IMPLANT

## 2015-06-05 NOTE — Anesthesia Procedure Notes (Signed)
Procedure Name: MAC Date/Time: 06/05/2015 7:42 AM Performed by: Garrison Columbus T Pre-anesthesia Checklist: Patient identified, Emergency Drugs available, Suction available and Patient being monitored Patient Re-evaluated:Patient Re-evaluated prior to inductionOxygen Delivery Method: Simple face mask Preoxygenation: Pre-oxygenation with 100% oxygen Intubation Type: IV induction Placement Confirmation: positive ETCO2 and breath sounds checked- equal and bilateral Dental Injury: Teeth and Oropharynx as per pre-operative assessment

## 2015-06-05 NOTE — Transfer of Care (Signed)
Immediate Anesthesia Transfer of Care Note  Patient: [name]  Procedure(s) Performed: Procedure(s): STERNAL WIRES REMOVAL (N/A)  Patient Location: PACU  Anesthesia Type:MAC  Level of Consciousness: awake, alert  and oriented  Airway & Oxygen Therapy: Patient Spontanous Breathing and Patient connected to nasal cannula oxygen  Post-op Assessment: Report given to RN, Post -op Vital signs reviewed and stable and Patient moving all extremities X 4  Post vital signs: Reviewed and stable  Last Vitals:  Filed Vitals:   06/05/15 0623  BP: 122/50  Pulse: 70  Temp: 36.7 C  Resp: 20    Complications: No apparent anesthesia complications

## 2015-06-05 NOTE — Op Note (Signed)
CARDIOTHORACIC SURGERY OPERATIVE NOTE  Date of Procedure:   06/05/2015  Preoperative Diagnosis:  Open sternal wound with exposed sternal wire  Postoperative Diagnosis:  same  Procedure:    Sternal wire removal  Surgeon:    Valentina Gu. Roxy Manns, MD  Assistant:    Alcide Evener, CRNFA  Anesthesia:    MAC    DETAILS OF THE OPERATIVE PROCEDURE  The patient is brought to the operating room on the above mentioned date.  The patient is placed in the supine position on the operating table.  Light intravenous sedation is administered by the anesthesia team.  The patient's existing wound VAC dressing is removed.  The wound is notably clean with healthy granulation tissue and a minimal amount of fibrous exudate.  There is a single double-strand sternal wire exposed at the base near the cephalad apex of the wound.  The wire is cut and removed.  The minimal fibrous exudate is debrided sharply and the wound is irrigated clean.  A new wound VAC dressing is applied.  The patient tolerated the procedure well and is transported to the PACU in stable condition.  There was trivial blood loss.  There were no complications.     Valentina Gu. Roxy Manns MD 06/05/2015 7:59 AM

## 2015-06-05 NOTE — H&P (Addendum)
[address]       [place] [address]             [phone]          CARDIOTHORACIC SURGERY HISTORY AND PHYSICAL EXAM  Referring Provider is Wellington Hampshire, MD PCP is Ezequiel Kayser, MD   HPI:  Patient returns to the office today for wound check with superficial wound breakdown involving the inferior aspect of her median sternotomy incision. She was just seen in the office one week ago. Over the last week she has done well. She states that Saturday she felt as good as she has felt since surgery. She has been ambulating much better and she is scheduled to be discharged home later this week. Yesterday was a bad day and she experience increased cough and congestion and she was given a shot of prednisone by a nurse practitioner at the skilled nursing facility. She denies any fevers or chills. She has not had productive cough. She has had some pain in her chest seems to be related to the incision. She otherwise feels well.   Past Medical History  Diagnosis Date  . Hypertension   . COPD (chronic obstructive pulmonary disease)   . Hyperlipidemia   . Meniere disease   . HX: breast cancer   . Acute CHF (congestive heart failure)   . Atrial fibrillation   . Mitral regurgitation   . COPD (chronic obstructive pulmonary disease)   . Heart murmur     as child  . Hypothyroidism   . Cancer     hx breast cancer  . Breast cancer 08/09/2001    right/rad  . Pulmonary hypertension   . Tricuspid regurgitation   . Chronic combined systolic and diastolic CHF (congestive heart failure)   . Coronary artery disease   . PONV (postoperative nausea and vomiting)   . Shortness of breath dyspnea     on exertion  . Anxiety   . GERD (gastroesophageal reflux disease)   . Arthritis   . s/p mitral valve replacement with bioprosthetic tissue valve 04/25/2015    27 mm [facility] Mitral bovine bioprosthetic tissue valve  . S/P tricuspid valve repair 04/25/2015    26 mm Edwards  mc3 ring annuloplasty  . S/P Maze operation for atrial fibrillation 04/25/2015    Complete bilateral atrial lesion set using cryothermy and bipolar radiofrequency ablation with clipping of LA appendage  . S/P CABG x 1 04/25/2015    LIMA to LAD  . Post-surgical complete heart block, symptomatic 05/03/2015    Past Surgical History  Procedure Laterality Date  . Cochlear implant Bilateral   . Mastectomy, partial Right   . Cardiac catheterization  11/2013    [facility]  . Cardiac catheterization  10/2014    [facility]  . Breast biopsy Left 11/25/06    neg  . Breast biopsy Left 01/20/12    lt bx /clip-neg  . Eye surgery Bilateral 2014    cataract surgery  . Mitral valve repair N/A 04/25/2015    Procedure: MITRAL VALVE  REPLACEMENT using a 27 mm Edwards Perimount Magna Mitral Ease Valve;  Surgeon: Rexene Alberts, MD;  Location: [place];  Service: Open Heart Surgery;  Laterality: N/A;  . Tricuspid valve replacement N/A 04/25/2015    Procedure: TRICUSPID VALVE REPAIR;  Surgeon: Rexene Alberts, MD;  Location: [place];  Service: Open Heart Surgery;  Laterality: N/A;  . Maze N/A 04/25/2015    Procedure: MAZE;  Surgeon: Rexene Alberts, MD;  Location: [place];  Service: Open Heart Surgery;  Laterality: N/A;  . Tee without cardioversion N/A 04/25/2015    Procedure: TRANSESOPHAGEAL ECHOCARDIOGRAM (TEE);  Surgeon: Rexene Alberts, MD;  Location: [place];  Service: Open Heart Surgery;  Laterality: N/A;  . Clipping of atrial appendage N/A 04/25/2015    Procedure: CLIPPING OF ATRIAL APPENDAGE;  Surgeon: Rexene Alberts, MD;  Location: [place] [facility];  Service: Open Heart Surgery;  Laterality: N/A;  . Ep implantable device N/A 05/02/2015    Procedure: Pacemaker Implant;  Surgeon: Thompson Grayer, MD;  Location: [place] [facility];  Service: Cardiovascular;  Laterality: N/A;  . Sternal wound debridement N/A 05/09/2015    Procedure: STERNAL WOUND DEBRIDEMENT;  Surgeon: Rexene Alberts, MD;  Location: [place];  Service: Thoracic;  Laterality: N/A;    . Application of wound vac N/A 05/09/2015    Procedure: APPLICATION OF WOUND VAC;  Surgeon: Rexene Alberts, MD;  [facility];  Service: Thoracic;  Laterality: N/A;  . Coronary artery bypass graft N/A 04/25/2015    Procedure: CORONARY ARTERY BYPASS GRAFTING (CABG) x ONE, using left internal mammary artery;  Surgeon: Rexene Alberts, MD;  Location: [place];  Service: Open Heart Surgery;  Laterality: N/A;    Family History  Problem Relation Age of Onset  . Heart disease Father   . Heart disease Brother   . Heart attack Paternal Uncle     Social History Social History  Substance Use Topics  . Smoking status: Former Smoker -- 40 years    Types: Cigarettes    Quit date: 04/20/1998  . Smokeless tobacco: None  . Alcohol Use: No    Prior to Admission medications   Medication Sig Start Date End Date Taking? Authorizing Provider  acetaminophen (TYLENOL) 325 MG tablet Take 2 tablets (650 mg total) by mouth every 4 (four) hours as needed for mild pain (or Fever >/= 101). 05/11/15  Yes Erin R Barrett, PA-C  amiodarone (PACERONE) 200 MG tablet TAKE 1 TABLET TWICE A DAY 05/16/15  Yes Rexene Alberts, MD  aspirin EC 81 MG EC tablet Take 1 tablet (81 mg total) by mouth daily. 05/11/15  Yes Erin R Barrett, PA-C  atorvastatin (LIPITOR) 10 MG tablet Take 1 tablet (10 mg total) by mouth daily at 6 PM. 05/11/15  Yes Erin R Barrett, PA-C  benzonatate (TESSALON) 100 MG capsule Take 100 mg by mouth 3 (three) times daily as needed for cough (for cold).    Yes Historical Provider, MD  bisoprolol (ZEBETA) 10 MG tablet Take 1 tablet (10 mg total) by mouth daily. 06/01/15  Yes Wellington Hampshire, MD  docusate sodium (COLACE) 100 MG capsule Take 2 capsules (200 mg total) by mouth 2 (two) times daily as needed for mild constipation. 05/11/15  Yes Erin R Barrett, PA-C  ferrous sulfate 325 (65 FE) MG tablet Take 1 tablet (325 mg total) by mouth daily with breakfast. 05/11/15  Yes Erin R Barrett, PA-C  fluticasone (FLONASE) 50  MCG/ACT nasal spray Place 1 spray into both nostrils daily as needed for allergies.    Yes Historical Provider, MD  furosemide (LASIX) 20 MG tablet Take 20 mg by mouth 2 (two) times daily.    Yes Historical Provider, MD  Indacaterol Maleate 75 MCG CAPS Place into inhaler and inhale daily.   Yes Historical Provider, MD  ipratropium-albuterol (DUONEB) 0.5-2.5 (3) MG/3ML SOLN Take 3 mLs by nebulization every 6 (six) hours as needed (  for shortness of breath).    Yes Historical Provider, MD  levothyroxine (SYNTHROID, LEVOTHROID) 88 MCG tablet Take 88 mcg by mouth daily before breakfast.   Yes Historical Provider, MD  losartan (COZAAR) 50 MG tablet Take 1 tablet (50 mg total) by mouth daily. 06/01/15  Yes Wellington Hampshire, MD  Multiple Vitamins-Minerals (PRESERVISION AREDS PO) Take 2 tablets by mouth daily.    Yes Historical Provider, MD  oxyCODONE (OXY IR/ROXICODONE) 5 MG immediate release tablet Take 1-2 tablets (5-10 mg total) by mouth every 3 (three) hours as needed for severe pain. 05/11/15  Yes Erin R Barrett, PA-C  pantoprazole (PROTONIX) 40 MG tablet Take 40 mg by mouth daily.   Yes Historical Provider, MD  potassium chloride SA (K-DUR,KLOR-CON) 20 MEQ tablet Take 1 tablet (20 mEq total) by mouth daily. 05/11/15  Yes Erin R Barrett, PA-C  spironolactone (ALDACTONE) 25 MG tablet Take 1 tablet (25 mg total) by mouth daily. 12/12/14  Yes Wellington Hampshire, MD  traMADol (ULTRAM) 50 MG tablet Take 1-2 tablets (50-100 mg total) by mouth every 4 (four) hours as needed for moderate pain. 05/11/15  Yes Erin R Barrett, PA-C  traZODone (DESYREL) 50 MG tablet Take 50 mg by mouth at bedtime as needed for sleep.   Yes Historical Provider, MD  warfarin (COUMADIN) 3 MG tablet Take 1 tablet (3 mg total) by mouth daily at 6 PM. 05/11/15  Yes Erin R Barrett, PA-C    Allergies  Allergen Reactions  . Augmentin [Amoxicillin-Pot Clavulanate] Swelling    Swollen joints, pain  . Nifedipine     Other reaction(s): Unknown  .  Penicillins     Other reaction(s): Other (See Comments) Swollen joints  . Propranolol     Other reaction(s): Unknown  . Ace Inhibitors     Other reaction(s): Cough  . Diazepam     Other reaction(s): Other (See Comments) Made her "hyper"  . Meperidine     Other reaction(s): Other (See Comments) Made her "hyper"  . Propoxyphene     Other reaction(s): Other (See Comments) Made her "hyper"    Review of Systems:  As per HPI Otherwise non-contributory  Physical Exam:   BP 122/50 mmHg  Pulse 70  Temp(Src) 98 F (36.7 C) (Oral)  Resp 20  Ht 5\' 1"  (1.549 m)  Wt 60.782 kg (134 lb)  BMI 25.33 kg/m2  SpO2 100% General:Well-appearing Chest:Clear with a few inspiratory crackles right lung base MC:NOBSJGG rate and rhythm Incisions:Wound VAC dressing is changed. There is no drainage appreciated and the wound is clean. There is some devitalized tissue at the wound base which is debrided sharply. A sternal wires exposed. The upper portion of the sternotomy appears to be healing nicely. Abdomen:Soft and nontender Extremities:Warm and well-perfused    Diagnostic Tests:  N/A   Impression:  Exposed sternal wire within the base of the patient's open sternal wound. The wound appears to be healing otherwise without signs of ongoing infection.  Plan:  We plan to remove the exposed sternal wire using conscious sedation as an outpatient procedure later this week. The patient has been instructed to temporarily stop taking Coumadin.   Valentina Gu. Roxy Manns, MD 05/29/2015 6:58 PM

## 2015-06-05 NOTE — Brief Op Note (Signed)
06/05/2015  8:05 AM  PATIENT:  [name]  80 y.o. female  PRE-OPERATIVE DIAGNOSIS:  SUPERFICIAL STERNAL WOUND  POST-OPERATIVE DIAGNOSIS:  SUPERFICIAL STERNAL WOUND  PROCEDURE:  Procedure(s): STERNAL WIRES REMOVAL (N/A)  SURGEON:  Surgeon(s) and Role:    * Rexene Alberts, MD - Primary  ASSISTANTS: Alcide Evener, CRNFA   ANESTHESIA:   MAC  EBL:  Total I/O In: 250 [I.V.:250] Out: 10 [Blood:10]  BLOOD ADMINISTERED:none  DRAINS: none   LOCAL MEDICATIONS USED:  NONE  SPECIMEN:  No Specimen  DISPOSITION OF SPECIMEN:  N/A  COUNTS:  YES  TOURNIQUET:  * No tourniquets in log *  DICTATION: .Note written in EPIC  PLAN OF CARE: Discharge to home after PACU  PATIENT DISPOSITION:  PACU - hemodynamically stable.   Delay start of Pharmacological VTE agent (>24hrs) due to surgical blood loss or risk of bleeding: not applicable  Rexene Alberts, MD 06/05/2015 8:06 AM

## 2015-06-05 NOTE — Anesthesia Preprocedure Evaluation (Addendum)
Anesthesia Evaluation  Patient identified by MRN, date of birth, ID band Patient awake    Reviewed: Allergy & Precautions, NPO status , Patient's Chart, lab work & pertinent test results  History of Anesthesia Complications (+) PONV and history of anesthetic complications  Airway Mallampati: II  TM Distance: >3 FB Neck ROM: Full    Dental  (+) Dental Advisory Given, Teeth Intact   Pulmonary shortness of breath and with exertion, COPD, former smoker,    Pulmonary exam normal        Cardiovascular hypertension, Pt. on medications and Pt. on home beta blockers + CAD, + CABG and +CHF  Normal cardiovascular exam+ dysrhythmias Atrial Fibrillation + pacemaker      Neuro/Psych PSYCHIATRIC DISORDERS Anxiety    GI/Hepatic GERD  Medicated,  Endo/Other  Hypothyroidism   Renal/GU      Musculoskeletal  (+) Arthritis ,   Abdominal   Peds  Hematology   Anesthesia Other Findings   Reproductive/Obstetrics                           Anesthesia Physical Anesthesia Plan  ASA: IV  Anesthesia Plan: MAC   Post-op Pain Management:    Induction: Intravenous  Airway Management Planned: Simple Face Mask  Additional Equipment:   Intra-op Plan:   Post-operative Plan: Extubation in OR  Informed Consent: I have reviewed the patients History and Physical, chart, labs and discussed the procedure including the risks, benefits and alternatives for the proposed anesthesia with the patient or authorized representative who has indicated his/her understanding and acceptance.   Dental advisory given  Plan Discussed with: CRNA and Surgeon  Anesthesia Plan Comments:        Anesthesia Quick Evaluation

## 2015-06-05 NOTE — Interval H&P Note (Signed)
History and Physical Interval Note:  06/05/2015 6:55 AM  [name]  has presented today for surgery, with the diagnosis of SUPERFICIAL STERNAL WOUND  The various methods of treatment have been discussed with the patient and family. After consideration of risks, benefits and other options for treatment, the patient has consented to  Procedure(s): STERNAL WIRES REMOVAL (N/A) as a surgical intervention .  The patient's history has been reviewed, patient examined, no change in status, stable for surgery.  I have reviewed the patient's chart and labs.  Questions were answered to the patient's satisfaction.     Rexene Alberts

## 2015-06-05 NOTE — Anesthesia Postprocedure Evaluation (Signed)
Anesthesia Post Note  Patient: [name]  Procedure(s) Performed: Procedure(s) (LRB): STERNAL WIRES REMOVAL (N/A)  Anesthesia type: MAC  Patient location: PACU  Post pain: Pain level controlled  Post assessment: Patient's Cardiovascular Status Stable  Last Vitals:  Filed Vitals:   06/05/15 0835  BP: 116/53  Pulse: 69  Temp: 36.5 C  Resp: 18    Post vital signs: Reviewed and stable  Level of consciousness: sedated  Complications: No apparent anesthesia complications

## 2015-06-06 ENCOUNTER — Encounter (HOSPITAL_COMMUNITY): Payer: Self-pay | Admitting: Thoracic Surgery (Cardiothoracic Vascular Surgery)

## 2015-06-12 ENCOUNTER — Ambulatory Visit (INDEPENDENT_AMBULATORY_CARE_PROVIDER_SITE_OTHER): Payer: Self-pay | Admitting: Thoracic Surgery (Cardiothoracic Vascular Surgery)

## 2015-06-12 ENCOUNTER — Encounter: Payer: Self-pay | Admitting: Thoracic Surgery (Cardiothoracic Vascular Surgery)

## 2015-06-12 VITALS — BP 120/63 | HR 69 | Resp 19 | Ht 61.0 in | Wt 133.0 lb

## 2015-06-12 DIAGNOSIS — Z951 Presence of aortocoronary bypass graft: Secondary | ICD-10-CM

## 2015-06-12 DIAGNOSIS — Z953 Presence of xenogenic heart valve: Secondary | ICD-10-CM

## 2015-06-12 DIAGNOSIS — R0789 Other chest pain: Secondary | ICD-10-CM

## 2015-06-12 DIAGNOSIS — Z952 Presence of prosthetic heart valve: Secondary | ICD-10-CM

## 2015-06-12 DIAGNOSIS — Z954 Presence of other heart-valve replacement: Secondary | ICD-10-CM

## 2015-06-12 DIAGNOSIS — R079 Chest pain, unspecified: Secondary | ICD-10-CM

## 2015-06-12 DIAGNOSIS — Z8679 Personal history of other diseases of the circulatory system: Secondary | ICD-10-CM

## 2015-06-12 DIAGNOSIS — Z9889 Other specified postprocedural states: Secondary | ICD-10-CM

## 2015-06-12 MED ORDER — AMIODARONE HCL 200 MG PO TABS: 200.0000 mg | ORAL_TABLET | Freq: Every day | ORAL | Status: DC

## 2015-06-12 NOTE — Progress Notes (Signed)
[address]       [place] [address]             [phone]     CARDIOTHORACIC SURGERY OFFICE NOTE  Referring Provider is Wellington Hampshire, MD PCP is Ezequiel Kayser, MD   HPI:  Patient returns office today for wound check having recently undergone sternal wire removal with a small open wound involving the inferior aspect of her median sternotomy incision. She has done well over the last week. She is walking every day and reports improving exercise tolerance. She has had no fevers or chills. She has minimal soreness in the chest. By report the home health nurse has commented on the relatively fast pace of healing with no problems associated with wound care.   Current Outpatient Prescriptions  Medication Sig Dispense Refill  . acetaminophen (TYLENOL) 325 MG tablet Take 2 tablets (650 mg total) by mouth every 4 (four) hours as needed for mild pain (or Fever >/= 101).    Marland Kitchen amiodarone (PACERONE) 200 MG tablet TAKE 1 TABLET TWICE A DAY 60 tablet 0  . aspirin EC 81 MG EC tablet Take 1 tablet (81 mg total) by mouth daily.    Marland Kitchen atorvastatin (LIPITOR) 10 MG tablet Take 1 tablet (10 mg total) by mouth daily at 6 PM.    . benzonatate (TESSALON) 100 MG capsule Take 100 mg by mouth 3 (three) times daily as needed for cough (for cold).     . bisoprolol (ZEBETA) 10 MG tablet Take 1 tablet (10 mg total) by mouth daily. 30 tablet 5  . docusate sodium (COLACE) 100 MG capsule Take 2 capsules (200 mg total) by mouth 2 (two) times daily as needed for mild constipation. 10 capsule 0  . ferrous sulfate 325 (65 FE) MG tablet Take 1 tablet (325 mg total) by mouth daily with breakfast.  3  . fluticasone (FLONASE) 50 MCG/ACT nasal spray Place 1 spray into both nostrils daily as needed for allergies.     . furosemide (LASIX) 20 MG tablet Take 20 mg by mouth 2 (two) times daily.     . Indacaterol Maleate 75 MCG CAPS Place into inhaler and inhale daily.    Marland Kitchen ipratropium-albuterol (DUONEB)  0.5-2.5 (3) MG/3ML SOLN Take 3 mLs by nebulization every 6 (six) hours as needed (for shortness of breath).     Marland Kitchen levothyroxine (SYNTHROID, LEVOTHROID) 88 MCG tablet Take 88 mcg by mouth daily before breakfast.    . losartan (COZAAR) 50 MG tablet Take 1 tablet (50 mg total) by mouth daily. 90 tablet 3  . Multiple Vitamins-Minerals (PRESERVISION AREDS PO) Take 2 tablets by mouth daily.     Marland Kitchen oxyCODONE (OXY IR/ROXICODONE) 5 MG immediate release tablet Take 1-2 tablets (5-10 mg total) by mouth every 3 (three) hours as needed for severe pain. 30 tablet 0  . pantoprazole (PROTONIX) 40 MG tablet Take 40 mg by mouth daily.    . potassium chloride SA (K-DUR,KLOR-CON) 20 MEQ tablet Take 1 tablet (20 mEq total) by mouth daily.    Marland Kitchen spironolactone (ALDACTONE) 25 MG tablet Take 1 tablet (25 mg total) by mouth daily. 30 tablet 6  . traMADol (ULTRAM) 50 MG tablet Take 1-2 tablets (50-100 mg total) by mouth every 4 (four) hours as needed for moderate pain. 30 tablet 0  . traZODone (DESYREL) 50 MG tablet Take 50 mg by mouth at bedtime as needed for sleep.    Marland Kitchen warfarin (COUMADIN) 3 MG tablet  Take 1 tablet (3 mg total) by mouth daily at 6 PM.     No current facility-administered medications for this visit.      Physical Exam:   BP 120/63 mmHg  Pulse 69  Resp 19  Ht 5\' 1"  (1.549 m)  Wt 133 lb (60.328 kg)  BMI 25.14 kg/m2  SpO2 97%  General:  Well-appearing  Chest:   Clear to auscultation  CV:   Regular rate and rhythm  Incisions:  Small open wound involving the inferior aspect of previous median sternotomy incision is granulating in very rapidly. The wound has already contracted significantly over the past week. There is no purulence or drainage whatsoever. There is no surrounding cellulitis.  Abdomen:  Soft and nontender  Extremities:  Warm and well-perfused  Diagnostic Tests:  n/a   Impression:  Patient's granulating open wound is progressing rapidly. I would anticipate that it will finish  granulating into the point where wound VAC therapy will be unnecessary within the next week or so.  Plan:  I have instructed the patient to decrease her dose of amiodarone to 200 mg daily. She will continue on all other medications without change. We will make arrangements for her prothrombin time to be checked and results called to Dr. Tyrell Antonio office to establish follow-up for long-term Coumadin therapy.  The patient will return for further follow-up and wound check in 4 weeks. She will call and return sooner as needed.   Valentina Gu. Roxy Manns, MD 06/12/2015 2:31 PM

## 2015-06-12 NOTE — Patient Instructions (Signed)
Decrease your dose of Amiodarone to 200 mg daily until your current prescription runs out, then stop taking it altogether.  Otherwise continue all previous medications without any changes at this time

## 2015-06-14 ENCOUNTER — Telehealth: Payer: Self-pay | Admitting: Cardiology

## 2015-06-14 LAB — POCT INR: INR: 3.8

## 2015-06-14 NOTE — Telephone Encounter (Signed)
  Was paged after hours by Lattie Haw, RN from [facility], regarding and abnormal INR level. I attempted to contact her but was directed to voice mail but mail box was full. I also attempted to contact patient but no answer. Will retry RN.   [name]

## 2015-06-14 NOTE — Telephone Encounter (Signed)
  Spoke with home health RN. INR is 3.8. Patient instructed to hold tonight's dose and call the office tomorrow for dosing recommendations.  Katriona Schmierer

## 2015-06-15 ENCOUNTER — Ambulatory Visit (INDEPENDENT_AMBULATORY_CARE_PROVIDER_SITE_OTHER): Payer: Medicare PPO | Admitting: Internal Medicine

## 2015-06-15 ENCOUNTER — Telehealth: Payer: Self-pay | Admitting: *Deleted

## 2015-06-15 DIAGNOSIS — I340 Nonrheumatic mitral (valve) insufficiency: Secondary | ICD-10-CM

## 2015-06-15 DIAGNOSIS — Z953 Presence of xenogenic heart valve: Secondary | ICD-10-CM

## 2015-06-15 DIAGNOSIS — I4891 Unspecified atrial fibrillation: Secondary | ICD-10-CM

## 2015-06-15 NOTE — Telephone Encounter (Signed)
Spoke with spouse, see Anit-coag encounter

## 2015-06-15 NOTE — Telephone Encounter (Signed)
Pt calling stating her home health nurse told her not to take her coumadin yesterday not take one  For it was 3.9  She was told to call us and see when is she to restart it Please advise

## 2015-06-19 ENCOUNTER — Telehealth: Payer: Self-pay

## 2015-06-19 NOTE — Telephone Encounter (Signed)
Spoke with [name] pt's husband, advised once [facility] discharges pt she will follow-up in [facility] on Wednesday's in [place] to have Coumadin checks.  Will need weekly checks until INR stable then will gradually space out to approx once a month is INR's therapeutic and stable.  Pt's husband verbalized understanding.

## 2015-06-19 NOTE — Telephone Encounter (Signed)
Pt son called, states Home health nurse checks coumadin, States they will check it on Wednesday, he asks going forward, what does pt need to do after this Wednesday's check. Please call and advise.

## 2015-06-21 ENCOUNTER — Ambulatory Visit (INDEPENDENT_AMBULATORY_CARE_PROVIDER_SITE_OTHER): Payer: Medicare PPO | Admitting: Internal Medicine

## 2015-06-21 ENCOUNTER — Telehealth: Payer: Self-pay | Admitting: *Deleted

## 2015-06-21 DIAGNOSIS — I340 Nonrheumatic mitral (valve) insufficiency: Secondary | ICD-10-CM

## 2015-06-21 DIAGNOSIS — I4891 Unspecified atrial fibrillation: Secondary | ICD-10-CM

## 2015-06-21 DIAGNOSIS — Z953 Presence of xenogenic heart valve: Secondary | ICD-10-CM

## 2015-06-21 LAB — POCT INR: INR: 3

## 2015-06-21 NOTE — Telephone Encounter (Signed)
[facility] calling in the results for coumadin   PT 36.5  INR is 3

## 2015-06-28 ENCOUNTER — Telehealth: Payer: Self-pay | Admitting: Cardiovascular Disease

## 2015-06-28 ENCOUNTER — Ambulatory Visit (INDEPENDENT_AMBULATORY_CARE_PROVIDER_SITE_OTHER): Payer: Medicare PPO | Admitting: Cardiovascular Disease

## 2015-06-28 DIAGNOSIS — I4891 Unspecified atrial fibrillation: Secondary | ICD-10-CM

## 2015-06-28 DIAGNOSIS — I340 Nonrheumatic mitral (valve) insufficiency: Secondary | ICD-10-CM

## 2015-06-28 DIAGNOSIS — Z953 Presence of xenogenic heart valve: Secondary | ICD-10-CM

## 2015-06-28 LAB — POCT INR: INR: 2.7

## 2015-06-28 NOTE — Telephone Encounter (Signed)
Pt inr results from h/h nurse   Pt 32.3  INR 2.7

## 2015-06-28 NOTE — Telephone Encounter (Signed)
Result noted see anticoagulation note in Epic.  

## 2015-06-28 NOTE — Telephone Encounter (Signed)
Fwd to [employer]

## 2015-06-30 ENCOUNTER — Other Ambulatory Visit: Payer: Self-pay

## 2015-06-30 MED ORDER — WARFARIN SODIUM 2.5 MG PO TABS: 2.5000 mg | ORAL_TABLET | ORAL | Status: DC

## 2015-07-03 ENCOUNTER — Encounter: Payer: Medicare PPO | Attending: Internal Medicine | Admitting: *Deleted

## 2015-07-03 VITALS — Ht 61.0 in | Wt 136.5 lb

## 2015-07-03 DIAGNOSIS — T888XXA Other specified complications of surgical and medical care, not elsewhere classified, initial encounter: Secondary | ICD-10-CM | POA: Diagnosis not present

## 2015-07-03 DIAGNOSIS — Z951 Presence of aortocoronary bypass graft: Secondary | ICD-10-CM

## 2015-07-03 DIAGNOSIS — X58XXXA Exposure to other specified factors, initial encounter: Secondary | ICD-10-CM | POA: Insufficient documentation

## 2015-07-03 DIAGNOSIS — Z952 Presence of prosthetic heart valve: Secondary | ICD-10-CM

## 2015-07-03 NOTE — Patient Instructions (Signed)
Patient Instructions  Patient Details  Name: [name] MRN: [id number] Date of Birth: 09/02/1935 Referring Provider:  Wellington Hampshire, MD  Below are the personal goals you chose as well as exercise and nutrition goals. Our goal is to help you keep on track towards obtaining and maintaining your goals. We will be discussing your progress on these goals with you throughout the program.  Initial Exercise Prescription:     Initial Exercise Prescription - 07/03/15 1400    Date of Initial Exercise Prescription   Date 07/03/15   Bike   Level 0.5   Minutes 15   Recumbant Bike   Level 2   RPM 30   Watts 20   Minutes 15   NuStep   Level 2   Watts 30   Minutes 15   Cybex   Level 1   RPM 30   Minutes 15   Recumbant Elliptical   Level 1   Watts 20   Minutes 15   REL-XR   Level 2   Watts 30   Minutes 15   Prescription Details   Frequency (times per week) 3   Duration Progress to 30 minutes of continuous aerobic without signs/symptoms of physical distress   Intensity   THRR REST +  30   Progression Continue progressive overload as per policy without signs/symptoms or physical distress.  no upper body exercise due to sternal wound precautions   Resistance Training   Training Prescription No  ROM only due to sternal wound precautions      Exercise Goals: Frequency: Be able to perform aerobic exercise three times per week working toward 3-5 days per week.  Intensity: Work with a perceived exertion of 11 (fairly light) - 15 (hard) as tolerated. Follow your new exercise prescription and watch for changes in prescription as you progress with the program. Changes will be reviewed with you when they are made.  Duration: You should be able to do 30 minutes of continuous aerobic exercise in addition to a 5 minute warm-up and a 5 minute cool-down routine.  Nutrition Goals: Your personal nutrition goals will be established when you do your nutrition analysis with the  dietician.  The following are nutrition guidelines to follow: Cholesterol < 200mg /day Sodium < 1500mg /day Fiber: Women over 50 yrs - 21 grams per day  Personal Goals:     Personal Goals and Risk Factors at Admission - 07/03/15 1339    Personal Goals and Risk Factors on Admission   Increase Aerobic Exercise and Physical Activity Yes;Sedentary  Balance concerns ,using walker for ambulation.   Intervention While in program, learn and follow the exercise prescription taught. Start at a low level workload and increase workload after able to maintain previous level for 30 minutes. Increase time before increasing intensity.   Intervention Provide exercise education and an individualized exercise prescription that will provide continued progressive overload as per policy without signs/symptoms of physical distress.   Diabetes No   Hypertension Yes   Goal Participant will see blood pressure controlled within the values of 140/82mm/Hg or within value directed by their physician.   Intervention Provide nutrition & aerobic exercise along with prescribed medications to achieve BP 140/90 or less.   Lipids Yes   Goal Cholesterol controlled with medications as prescribed, with individualized exercise RX and with personalized nutrition plan. Value goals: LDL < 70mg , HDL > 40mg . Participant states understanding of desired cholesterol values and following prescriptions.   Intervention Provide nutrition & aerobic exercise along  with prescribed medications to achieve LDL 70mg , HDL >40mg .      Tobacco Use Initial Evaluation: History  Smoking status  . Former Smoker -- 64 years  . Types: Cigarettes  . Quit date: 04/20/1998  Smokeless tobacco  . Not on file    Copy of goals given to participant.

## 2015-07-03 NOTE — Progress Notes (Signed)
Cardiac Individual Treatment Plan  Patient Details  Name: [name] MRN: [id number] Date of Birth: [birth year] Referring Provider:  Wellington Hampshire, MD  Initial Encounter Date: Date: 07/03/15  Visit Diagnosis: S/P MVR (mitral valve replacement)  S/P CABG x 1  Patient's Home Medications on Admission:  Current outpatient prescriptions:  .  acetaminophen (TYLENOL) 325 MG tablet, Take 2 tablets (650 mg total) by mouth every 4 (four) hours as needed for mild pain (or Fever >/= 101)., Disp: , Rfl:  .  amiodarone (PACERONE) 200 MG tablet, Take 1 tablet (200 mg total) by mouth daily., Disp: 60 tablet, Rfl: 0 .  aspirin EC 81 MG EC tablet, Take 1 tablet (81 mg total) by mouth daily., Disp: , Rfl:  .  bisoprolol (ZEBETA) 10 MG tablet, Take 1 tablet (10 mg total) by mouth daily., Disp: 30 tablet, Rfl: 5 .  fluticasone (FLONASE) 50 MCG/ACT nasal spray, Place 1 spray into both nostrils daily as needed for allergies. , Disp: , Rfl:  .  furosemide (LASIX) 20 MG tablet, Take 20 mg by mouth 2 (two) times daily. , Disp: , Rfl:  .  Indacaterol Maleate 75 MCG CAPS, Place into inhaler and inhale daily., Disp: , Rfl:  .  ipratropium-albuterol (DUONEB) 0.5-2.5 (3) MG/3ML SOLN, Take 3 mLs by nebulization every 6 (six) hours as needed (for shortness of breath). , Disp: , Rfl:  .  levothyroxine (SYNTHROID, LEVOTHROID) 88 MCG tablet, Take 88 mcg by mouth daily before breakfast., Disp: , Rfl:  .  losartan (COZAAR) 50 MG tablet, Take 1 tablet (50 mg total) by mouth daily., Disp: 90 tablet, Rfl: 3 .  Multiple Vitamins-Minerals (PRESERVISION AREDS) TABS, Take 2 tablets by mouth., Disp: , Rfl:  .  pantoprazole (PROTONIX) 40 MG tablet, Take 40 mg by mouth 2 (two) times daily. , Disp: , Rfl:  .  potassium chloride SA (K-DUR,KLOR-CON) 20 MEQ tablet, Take 1 tablet (20 mEq total) by mouth daily., Disp: , Rfl:  .  simvastatin (ZOCOR) 20 MG tablet, Take 20 mg by mouth daily., Disp: , Rfl:  .  spironolactone (ALDACTONE) 25  MG tablet, Take 1 tablet (25 mg total) by mouth daily., Disp: 30 tablet, Rfl: 6 .  warfarin (COUMADIN) 2.5 MG tablet, Take 1 tablet (2.5 mg total) by mouth as directed., Disp: 30 tablet, Rfl: 3 .  atorvastatin (LIPITOR) 10 MG tablet, Take 1 tablet (10 mg total) by mouth daily at 6 PM. (Patient not taking: Reported on 07/03/2015), Disp: , Rfl:  .  benzonatate (TESSALON) 100 MG capsule, Take 100 mg by mouth 3 (three) times daily as needed for cough (for cold). , Disp: , Rfl:  .  docusate sodium (COLACE) 100 MG capsule, Take 2 capsules (200 mg total) by mouth 2 (two) times daily as needed for mild constipation., Disp: 10 capsule, Rfl: 0 .  ferrous sulfate 325 (65 FE) MG tablet, Take 1 tablet (325 mg total) by mouth daily with breakfast., Disp: , Rfl: 3 .  Multiple Vitamins-Minerals (PRESERVISION AREDS PO), Take 2 tablets by mouth daily. , Disp: , Rfl:  .  oxyCODONE (OXY IR/ROXICODONE) 5 MG immediate release tablet, Take 1-2 tablets (5-10 mg total) by mouth every 3 (three) hours as needed for severe pain. (Patient not taking: Reported on 07/03/2015), Disp: 30 tablet, Rfl: 0 .  traMADol (ULTRAM) 50 MG tablet, Take 1-2 tablets (50-100 mg total) by mouth every 4 (four) hours as needed for moderate pain., Disp: 30 tablet, Rfl: 0 .  traZODone (DESYREL)  50 MG tablet, Take 50 mg by mouth at bedtime as needed for sleep., Disp: , Rfl:   Past Medical History: Past Medical History  Diagnosis Date  . Hypertension   . COPD (chronic obstructive pulmonary disease) ([place])   . Hyperlipidemia   . Meniere disease   . HX: breast cancer   . Acute CHF (congestive heart failure) ([place])   . Atrial fibrillation ([place])   . Mitral regurgitation   . COPD (chronic obstructive pulmonary disease) ([place])   . Heart murmur     as child  . Hypothyroidism   . Cancer ([place])     hx breast cancer  . Breast cancer ([place]) 08/09/2001    right/rad  . Pulmonary hypertension ([place])   . Tricuspid regurgitation   . Chronic combined systolic  and diastolic CHF (congestive heart failure) ([place])   . Coronary artery disease   . PONV (postoperative nausea and vomiting)   . Shortness of breath dyspnea     on exertion  . Anxiety   . GERD (gastroesophageal reflux disease)   . Arthritis   . s/p mitral valve replacement with bioprosthetic tissue valve 04/25/2015    27 mm [facility] Mitral bovine bioprosthetic tissue valve  . 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 S/P Maze operation for atrial fibrillation 04/25/2015    Complete bilateral atrial lesion set using cryothermy and bipolar radiofrequency ablation with clipping of LA appendage  . S/P CABG x 1 04/25/2015    LIMA to LAD  . Post-surgical complete heart block, symptomatic 05/03/2015  . Presence of permanent cardiac pacemaker     Tobacco Use: History  Smoking status  . Former Smoker -- 49 years  . Types: Cigarettes  . Quit date: 04/20/1998  Smokeless tobacco  . Not on file    Labs: Recent Review Flowsheet Data    Labs for ITP Cardiac and Pulmonary Rehab Latest Ref Rng 04/26/2015 04/27/2015 04/28/2015 04/29/2015 04/30/2015   TCO2 0 - 100 mmol/L 23 - - 34 34   O2SAT - - 68.5 65.3 61.7 -       Exercise Target Goals: Date: 07/03/15  Exercise Program Goal: Individual exercise prescription set with THRR, safety & activity barriers. Participant demonstrates ability to understand and report RPE using BORG scale, to self-measure pulse accurately, and to acknowledge the importance of the exercise prescription.  Exercise Prescription Goal: Starting with aerobic activity 30 plus minutes a day, 3 days per week for initial exercise prescription. Provide home exercise prescription and guidelines that participant acknowledges understanding prior to discharge.  Activity Barriers & Risk Stratification:     Activity Barriers & Risk Stratification - 07/03/15 1335    Activity Barriers & Risk Stratification   Activity Barriers Balance  Concerns;Deconditioning;Joint Problems  Meineres disease= balance concerns, bursitis left hip and leg, sternal open wound   Risk Stratification Moderate      6 Minute Walk:     6 Minute Walk      07/03/15 1424       6 Minute Walk   Phase Initial     Distance 450 feet  NS Test: 450 steps     Walk Time 6 minutes     Resting HR 69 bpm     Resting BP 132/80 mmHg     Max Ex. HR 84 bpm     Max Ex. BP 138/74 mmHg     RPE 11     Symptoms No  Initial Exercise Prescription:     Initial Exercise Prescription - 07/03/15 1400    Date of Initial Exercise Prescription   Date 07/03/15   Bike   Level 0.5   Minutes 15   Recumbant Bike   Level 2   RPM 30   Watts 20   Minutes 15   NuStep   Level 2   Watts 30   Minutes 15   Cybex   Level 1   RPM 30   Minutes 15   Recumbant Elliptical   Level 1   Watts 20   Minutes 15   REL-XR   Level 2   Watts 30   Minutes 15   Prescription Details   Frequency (times per week) 3   Duration Progress to 30 minutes of continuous aerobic without signs/symptoms of physical distress   Intensity   THRR REST +  30   Progression Continue progressive overload as per policy without signs/symptoms or physical distress.  no upper body exercise due to sternal wound precautions   Resistance Training   Training Prescription No  ROM only due to sternal wound precautions      Exercise Prescription Changes:   Discharge Exercise Prescription (Final Exercise Prescription Changes):   Nutrition:  Target Goals: Understanding of nutrition guidelines, daily intake of sodium 1500mg , cholesterol 200mg , calories 30% from fat and 7% or less from saturated fats, daily to have 5 or more servings of fruits and vegetables.  Biometrics:     Pre Biometrics - 07/03/15 1431    Pre Biometrics   Height 5\' 1"  (1.549 m)   Weight 136 lb 8 oz (61.916 kg)   Waist Circumference 34.5 inches   Hip Circumference 38.5 inches   Waist to Hip Ratio 0.9 %   BMI  (Calculated) 25.8       Nutrition Therapy Plan and Nutrition Goals:   Nutrition Discharge: Rate Your Plate Scores:   Nutrition Goals Re-Evaluation:   Psychosocial: Target Goals: Acknowledge presence or absence of depression, maximize coping skills, provide positive support system. Participant is able to verbalize types and ability to use techniques and skills needed for reducing stress and depression.  Initial Review & Psychosocial Screening:     Initial Psych Review & Screening - 07/03/15 1342    Family Dynamics   Good Support System? Yes   Barriers   Psychosocial barriers to participate in program There are no identifiable barriers or psychosocial needs.;The patient should benefit from training in stress management and relaxation.   Screening Interventions   Interventions Encouraged to exercise      Quality of Life Scores:     Quality of Life - 07/03/15 1446    Quality of Life Scores   Health/Function Pre 22.62 %   Socioeconomic Pre 29.58 %   Psych/Spiritual Pre 30 %   Family Pre 30 %   GLOBAL Pre 26.6 %      PHQ-9:     Recent Review Flowsheet Data    Depression screen [facility] 2/9 07/03/2015   Decreased Interest 0   Down, Depressed, Hopeless 0   PHQ - 2 Score 0   Altered sleeping 0   Tired, decreased energy 3   Change in appetite 0   Feeling bad or failure about yourself  0   Trouble concentrating 0   Moving slowly or fidgety/restless 0   Suicidal thoughts 0   PHQ-9 Score 3   Difficult doing work/chores Somewhat difficult      Psychosocial Evaluation and Intervention:  Psychosocial Re-Evaluation:   Vocational Rehabilitation: Provide vocational rehab assistance to qualifying candidates.   Vocational Rehab Evaluation & Intervention:     Vocational Rehab - 07/03/15 1337    Initial Vocational Rehab Evaluation & Intervention   Assessment shows need for Vocational Rehabilitation No      Education: Education Goals: Education classes will be  provided on a weekly basis, covering required topics. Participant will state understanding/return demonstration of topics presented.  Learning Barriers/Preferences:     Learning Barriers/Preferences - 07/03/15 1336    Learning Barriers/Preferences   Learning Barriers Hearing   Learning Preferences Video      Education Topics: General Nutrition Guidelines/Fats and Fiber: -Group instruction provided by verbal, written material, models and posters to present the general guidelines for heart healthy nutrition. Gives an explanation and review of dietary fats and fiber.   Controlling Sodium/Reading Food Labels: -Group verbal and written material supporting the discussion of sodium use in heart healthy nutrition. Review and explanation with models, verbal and written materials for utilization of the food label.   Exercise Physiology & Risk Factors: - Group verbal and written instruction with models to review the exercise physiology of the cardiovascular system and associated critical values. Details cardiovascular disease risk factors and the goals associated with each risk factor.   Aerobic Exercise & Resistance Training: - Gives group verbal and written discussion on the health impact of inactivity. On the components of aerobic and resistive training programs and the benefits of this training and how to safely progress through these programs.   Flexibility, Balance, General Exercise Guidelines: - Provides group verbal and written instruction on the benefits of flexibility and balance training programs. Provides general exercise guidelines with specific guidelines to those with heart or lung disease. Demonstration and skill practice provided.   Stress Management: - Provides group verbal and written instruction about the health risks of elevated stress, cause of high stress, and healthy ways to reduce stress.   Depression: - Provides group verbal and written instruction on the  correlation between heart/lung disease and depressed mood, treatment options, and the stigmas associated with seeking treatment.   Anatomy & Physiology of the Heart: - Group verbal and written instruction and models provide basic cardiac anatomy and physiology, with the coronary electrical and arterial systems. Review of: AMI, Angina, Valve disease, Heart Failure, Cardiac Arrhythmia, Pacemakers, and the ICD.   Cardiac Procedures: - Group verbal and written instruction and models to describe the testing methods done to diagnose heart disease. Reviews the outcomes of the test results. Describes the treatment choices: Medical Management, Angioplasty, or Coronary Bypass Surgery.   Cardiac Medications: - Group verbal and written instruction to review commonly prescribed medications for heart disease. Reviews the medication, class of the drug, and side effects. Includes the steps to properly store meds and maintain the prescription regimen.   Go Sex-Intimacy & Heart Disease, Get SMART - Goal Setting: - Group verbal and written instruction through game format to discuss heart disease and the return to sexual intimacy. Provides group verbal and written material to discuss and apply goal setting through the application of the S.M.A.R.T. Method.   Other Matters of the Heart: - Provides group verbal, written materials and models to describe Heart Failure, Angina, Valve Disease, and Diabetes in the realm of heart disease. Includes description of the disease process and treatment options available to the cardiac patient.   Exercise & Equipment Safety: - Individual verbal instruction and demonstration of equipment use and safety with use of  the equipment.          Cardiac Rehab from 07/03/2015 in [facility]   Date  07/03/15   Educator  SB   Instruction Review Code  2- meets goals/outcomes      Infection Prevention: - Provides verbal and written material to individual with discussion of  infection control including proper hand washing and proper equipment cleaning during exercise session.      Cardiac Rehab from 07/03/2015 in [facility]   Date  07/03/15   Educator  Sb   Instruction Review Code  2- meets goals/outcomes      Falls Prevention: - Provides verbal and written material to individual with discussion of falls prevention and safety.      Cardiac Rehab from 07/03/2015 in [facility]   Date  07/03/15   Educator  Sb   Instruction Review Code  2- meets goals/outcomes      Diabetes: - Individual verbal and written instruction to review signs/symptoms of diabetes, desired ranges of glucose level fasting, after meals and with exercise. Advice that pre and post exercise glucose checks will be done for 3 sessions at entry of program.    Knowledge Questionnaire Score:     Knowledge Questionnaire Score - 07/03/15 1440    Knowledge Questionnaire Score   Pre Score 26/28      Personal Goals and Risk Factors at Admission:     Personal Goals and Risk Factors at Admission - 07/03/15 1339    Personal Goals and Risk Factors on Admission   Increase Aerobic Exercise and Physical Activity Yes;Sedentary  Balance concerns ,using walker for ambulation.   Intervention While in program, learn and follow the exercise prescription taught. Start at a low level workload and increase workload after able to maintain previous level for 30 minutes. Increase time before increasing intensity.   Intervention Provide exercise education and an individualized exercise prescription that will provide continued progressive overload as per policy without signs/symptoms of physical distress.   Diabetes No   Hypertension Yes   Goal Participant will see blood pressure controlled within the values of 140/35mm/Hg or within value directed by their physician.   Intervention Provide nutrition & aerobic exercise along with prescribed medications to achieve BP 140/90 or less.   Lipids Yes    Goal Cholesterol controlled with medications as prescribed, with individualized exercise RX and with personalized nutrition plan. Value goals: LDL < 70mg , HDL > 40mg . Participant states understanding of desired cholesterol values and following prescriptions.   Intervention Provide nutrition & aerobic exercise along with prescribed medications to achieve LDL 70mg , HDL >40mg .      Personal Goals and Risk Factors Review:    Personal Goals Discharge (Final Personal Goals and Risk Factors Review):     Comments: Initial ITP. Domique wants to increase her stamina after her surgery. She is feeling stronger everyday and is ready to start Cardiac Rehab.

## 2015-07-07 ENCOUNTER — Ambulatory Visit (INDEPENDENT_AMBULATORY_CARE_PROVIDER_SITE_OTHER): Payer: Medicare PPO | Admitting: Cardiovascular Disease

## 2015-07-07 ENCOUNTER — Telehealth: Payer: Self-pay | Admitting: *Deleted

## 2015-07-07 DIAGNOSIS — Z953 Presence of xenogenic heart valve: Secondary | ICD-10-CM

## 2015-07-07 DIAGNOSIS — I4891 Unspecified atrial fibrillation: Secondary | ICD-10-CM

## 2015-07-07 DIAGNOSIS — I340 Nonrheumatic mitral (valve) insufficiency: Secondary | ICD-10-CM

## 2015-07-07 NOTE — Telephone Encounter (Signed)
Home Health nurse calling in INR results.   PT 32.4  INR 2.7   She starting cardiac rehab next week so they are also letting us know they will be discharging pt next week

## 2015-07-07 NOTE — Telephone Encounter (Signed)
See  Coumadin encounter

## 2015-07-10 ENCOUNTER — Encounter: Payer: Self-pay | Admitting: Thoracic Surgery (Cardiothoracic Vascular Surgery)

## 2015-07-10 ENCOUNTER — Ambulatory Visit (INDEPENDENT_AMBULATORY_CARE_PROVIDER_SITE_OTHER): Payer: Self-pay | Admitting: Thoracic Surgery (Cardiothoracic Vascular Surgery)

## 2015-07-10 VITALS — BP 114/77 | HR 69 | Resp 16 | Ht 62.0 in | Wt 130.0 lb

## 2015-07-10 DIAGNOSIS — R0789 Other chest pain: Secondary | ICD-10-CM

## 2015-07-10 DIAGNOSIS — Z8679 Personal history of other diseases of the circulatory system: Secondary | ICD-10-CM

## 2015-07-10 DIAGNOSIS — Z9889 Other specified postprocedural states: Secondary | ICD-10-CM

## 2015-07-10 DIAGNOSIS — R079 Chest pain, unspecified: Secondary | ICD-10-CM

## 2015-07-10 DIAGNOSIS — Z954 Presence of other heart-valve replacement: Secondary | ICD-10-CM

## 2015-07-10 DIAGNOSIS — Z953 Presence of xenogenic heart valve: Secondary | ICD-10-CM

## 2015-07-10 DIAGNOSIS — Z952 Presence of prosthetic heart valve: Secondary | ICD-10-CM

## 2015-07-10 DIAGNOSIS — Z951 Presence of aortocoronary bypass graft: Secondary | ICD-10-CM

## 2015-07-10 NOTE — Progress Notes (Signed)
[address]       [place] [address]             [phone]     CARDIOTHORACIC SURGERY OFFICE NOTE  Referring Provider is Wellington Hampshire, MD PCP is Ezequiel Kayser, MD   HPI:  Patient returns for routine follow-up and wound check status post mitral valve replacement using a bioprosthetic tissue valve, tricuspid valve repair, coronary artery bypass grafting 1, and Maze procedure on 04/25/2015.  She was last seen here in our office on 06/12/2015. Since then she has continued to do very well. Her sternal wound has essentially healed completely. Home health therapy has been discontinued and the patient has enrolled in outpatient cardiac rehabilitation program. She remains weak and her strength is slowly improving. She denies shortness of breath. She is still using oxygen but her oxygen saturation levels today relatively high at 97%. There have been no attempts to wean oxygen therapy. Appetite is good. The patient no longer has any pain at all. She is sleeping well at night.  Her weight has been stable. Prothrombin time has remained stable with INR reportedly level at 2.7.   Current Outpatient Prescriptions  Medication Sig Dispense Refill  . acetaminophen (TYLENOL) 325 MG tablet Take 2 tablets (650 mg total) by mouth every 4 (four) hours as needed for mild pain (or Fever >/= 101).    Marland Kitchen amiodarone (PACERONE) 200 MG tablet Take 1 tablet (200 mg total) by mouth daily. 60 tablet 0  . aspirin EC 81 MG EC tablet Take 1 tablet (81 mg total) by mouth daily.    . benzonatate (TESSALON) 100 MG capsule Take 100 mg by mouth 3 (three) times daily as needed for cough (for cold).     . bisoprolol (ZEBETA) 10 MG tablet Take 1 tablet (10 mg total) by mouth daily. 30 tablet 5  . fluticasone (FLONASE) 50 MCG/ACT nasal spray Place 1 spray into both nostrils daily as needed for allergies.     . furosemide (LASIX) 20 MG tablet Take 20 mg by mouth 2 (two) times daily.     . Indacaterol  Maleate 75 MCG CAPS Place into inhaler and inhale daily.    Marland Kitchen ipratropium-albuterol (DUONEB) 0.5-2.5 (3) MG/3ML SOLN Take 3 mLs by nebulization every 6 (six) hours as needed (for shortness of breath).     Marland Kitchen levothyroxine (SYNTHROID, LEVOTHROID) 88 MCG tablet Take 88 mcg by mouth daily before breakfast.    . losartan (COZAAR) 50 MG tablet Take 1 tablet (50 mg total) by mouth daily. 90 tablet 3  . Multiple Vitamins-Minerals (PRESERVISION AREDS PO) Take 2 tablets by mouth daily.     . pantoprazole (PROTONIX) 40 MG tablet Take 40 mg by mouth 2 (two) times daily.     . potassium chloride SA (K-DUR,KLOR-CON) 20 MEQ tablet Take 1 tablet (20 mEq total) by mouth daily.    . simvastatin (ZOCOR) 20 MG tablet Take 20 mg by mouth daily.    Marland Kitchen spironolactone (ALDACTONE) 25 MG tablet Take 1 tablet (25 mg total) by mouth daily. 30 tablet 6  . warfarin (COUMADIN) 2.5 MG tablet Take 1 tablet (2.5 mg total) by mouth as directed. 30 tablet 3   No current facility-administered medications for this visit.      Physical Exam:   BP 114/77 mmHg  Pulse 69  Resp 16  Ht 5\' 2"  (1.575 m)  Wt 130 lb (58.968 kg)  BMI 23.77 kg/m2  SpO2 96%  General:  Well-appearing  Chest:   Clear  CV:   Regular rate and rhythm without murmur  Incisions:  Sternotomy wound has virtually healed completely. There is a very tiny eschar in the inferior portion that is clean.  Abdomen:  Soft and nontender  Extremities:  No lower extremity edema  Diagnostic Tests:  2 channel telemetry rhythm strip demonstrates what appears to be AV paced rhythm   Impression:  Patient is doing well nearly 3 months following mitral valve replacement using a bioprosthetic tissue valve, tricuspid valve repair, single vessel coronary artery bypass grafting, and Maze procedure. Her sternal wound has finally healed completely.  Her exercise tolerance is slowly improving and the patient has started the outpatient cardiac rehabilitation program.   Plan:  I  have encouraged the patient to continue to gradually increase her physical activity as tolerated without any particular physical limitations. We have not recommended any changes to her current medications at this time. I have suggested that she may want to try weaning off and stopping the oxygen therapy. I suspect that she may no longer need it.  She is scheduled for follow-up with Dr. Fletcher Anon within the next few weeks. Depending upon her underlying rhythm it might be reasonable to stop amiodarone within the next 4-6 weeks.  I have encouraged the patient to participate in the outpatient cardiac rehabilitation program. All of her questions have been addressed. She will return for routine follow-up and rhythm check in 3 months.    Valentina Gu. Roxy Manns, MD 07/10/2015 1:12 PM

## 2015-07-10 NOTE — Patient Instructions (Signed)
Wean oxygen therapy off as long as oxygen saturation levels >90%  Continue all previous medications without any changes at this time  You may return to driving an automobile as long as you are no longer requiring oral narcotic pain relievers during the daytime.  It would be wise to start driving only short distances during the daylight and gradually increase from there as you feel comfortable.  You are encouraged to enroll and participate in the outpatient cardiac rehab program beginning as soon as practical.  You may resume unrestricted physical activity without any particular limitations at this time.

## 2015-07-11 ENCOUNTER — Encounter: Payer: Self-pay | Admitting: *Deleted

## 2015-07-12 ENCOUNTER — Telehealth: Payer: Self-pay | Admitting: Cardiovascular Disease

## 2015-07-12 ENCOUNTER — Encounter: Payer: Medicare PPO | Attending: Internal Medicine

## 2015-07-12 DIAGNOSIS — T888XXA Other specified complications of surgical and medical care, not elsewhere classified, initial encounter: Secondary | ICD-10-CM | POA: Diagnosis present

## 2015-07-12 DIAGNOSIS — X58XXXA Exposure to other specified factors, initial encounter: Secondary | ICD-10-CM | POA: Insufficient documentation

## 2015-07-12 DIAGNOSIS — Z952 Presence of prosthetic heart valve: Secondary | ICD-10-CM

## 2015-07-12 NOTE — Progress Notes (Signed)
Daily Session Note  Patient Details  Name: [name] MRN: [id number] Date of Birth: 1935-03-22 Referring Provider:  Wellington Hampshire, MD  Encounter Date: 07/12/2015  Check In:     Session Check In - 07/12/15 0827    Check-In   Staff Present Lestine Box BS, ACSM EP-C, Exercise Physiologist;Renee Dillard [place] MS, ACSM CEP Exercise Physiologist;Diane Mariana Arn, BSN   ER physicians immediately available to respond to emergencies See telemetry face sheet for immediately available ER MD   Medication changes reported     No   Fall or balance concerns reported    No   Warm-up and Cool-down Performed on first and last piece of equipment   VAD Patient? No   Pain Assessment   Currently in Pain? No/denies         Goals Met:  Proper associated with RPD/PD & O2 Sat Exercise tolerated well No report of cardiac concerns or symptoms Strength training completed today  Goals Unmet:  Not Applicable  Goals Comments:    Dr. Emily Filbert is Medical Director for [facility] and [facility].

## 2015-07-12 NOTE — Telephone Encounter (Signed)
err

## 2015-07-14 ENCOUNTER — Encounter: Payer: Medicare PPO | Admitting: *Deleted

## 2015-07-14 ENCOUNTER — Ambulatory Visit (INDEPENDENT_AMBULATORY_CARE_PROVIDER_SITE_OTHER): Payer: Medicare PPO | Admitting: Cardiovascular Disease

## 2015-07-14 ENCOUNTER — Encounter: Payer: Self-pay | Admitting: Cardiovascular Disease

## 2015-07-14 VITALS — BP 106/60 | HR 70 | Ht 61.0 in | Wt 133.5 lb

## 2015-07-14 DIAGNOSIS — I5022 Chronic systolic (congestive) heart failure: Secondary | ICD-10-CM | POA: Diagnosis not present

## 2015-07-14 DIAGNOSIS — Z950 Presence of cardiac pacemaker: Secondary | ICD-10-CM | POA: Diagnosis not present

## 2015-07-14 DIAGNOSIS — I2510 Atherosclerotic heart disease of native coronary artery without angina pectoris: Secondary | ICD-10-CM

## 2015-07-14 DIAGNOSIS — T888XXA Other specified complications of surgical and medical care, not elsewhere classified, initial encounter: Secondary | ICD-10-CM | POA: Diagnosis not present

## 2015-07-14 DIAGNOSIS — I4891 Unspecified atrial fibrillation: Secondary | ICD-10-CM | POA: Diagnosis not present

## 2015-07-14 DIAGNOSIS — Z951 Presence of aortocoronary bypass graft: Secondary | ICD-10-CM

## 2015-07-14 NOTE — Patient Instructions (Signed)
Medication Instructions: Continue same medications.   Labwork: None.   Procedures/Testing: None.   Follow-Up: 3 months with Dr. Adriahna Shearman.   Any Additional Special Instructions Will Be Listed Below (If Applicable).   

## 2015-07-14 NOTE — Progress Notes (Signed)
Daily Session Note  Patient Details  Name: [name] MRN: [id number] Date of Birth: 1935/06/10 Referring Provider:  Ezequiel Kayser, MD  Encounter Date: 07/14/2015  Check In:     Session Check In - 07/14/15 0916    Check-In   Staff Present Heath Lark RN, BSN, CCRP;Renee Dillard Essex MS, ACSM CEP Exercise Physiologist;Lyriq Jarchow RN, BSN   ER physicians immediately available to respond to emergencies See telemetry face sheet for immediately available ER MD   Medication changes reported     No   Fall or balance concerns reported    No   Warm-up and Cool-down Performed on first and last piece of equipment   VAD Patient? No   Pain Assessment   Currently in Pain? No/denies         Goals Met:  Proper associated with RPD/PD & O2 Sat Exercise tolerated well No report of cardiac concerns or symptoms  Goals Unmet:  Not Applicable  Goals Comments:    Dr. Emily Filbert is Medical Director for [facility] and [facility].

## 2015-07-14 NOTE — Assessment & Plan Note (Signed)
No evidence of recurrent atrial fibrillation. Current EKG shows AV sequential pacing. She is going to stop amiodarone after finishing current month supply. She was on Eliquis before her valve surgery and currently she is on warfarin. She asked if she can come off anticoagulation. I am not sure about the long-term results for holding anticoagulation in the setting of a successful maze procedure. I think anticoagulation would have to be continued for now. We might consider placing her back on Eliquis but that might be an off label indication.

## 2015-07-14 NOTE — Assessment & Plan Note (Signed)
Most recent ejection fraction was 50-55%. She is currently on optimal medical therapy and appears to be euvolemic. I will consider decreasing the dose of Lasix in the near future.

## 2015-07-14 NOTE — Assessment & Plan Note (Signed)
She has no symptoms suggestive of angina.

## 2015-07-14 NOTE — Assessment & Plan Note (Signed)
She seems to be 100% paced at the present time. She is going to follow-up with Dr. Caryl Comes in the near future. I think the rate should be reduced to see if she has underlying AV conduction and to decrease ventricular pacing especially in the setting of previous cardiomyopathy.

## 2015-07-14 NOTE — Progress Notes (Signed)
Primary care physician: Dr. Ezequiel Kayser  HPI  This is a pleasant 80 year old female who is here today for a follow-up visit regarding chronic systolic heart failure, atrial fibrillation and mitral regurgitation.  She has known history of COPD, breast cancer status post right partial mastectomy, hypothyroidism, hyperlipidemia and Mnire disease. She was hospitalized in February, 2016 at [facility] for worsening shortness of breath, palpitations and orthopnea. She was noted to be in atrial fibrillation with rapid ventricular response with heart rate in the 130 range. There was evidence of fluid overload with BNP of 2500. She underwent an echocardiogram which showed an ejection fraction of 35-40% with anteroseptal hypokinesis, moderately dilated left atrium, small pericardial effusion and moderate to severe mitral regurgitation. She was treated with rate control and diuresis with subsequent improvement. She underwent a right and left cardiac catheterization which showed moderately elevated filling pressures with giant V waves suggestive of significant mitral regurgitation. The coronary arteries were heavily calcified with moderate stenosis in the proximal to mid LAD. I proceeded with TEE before cardioversion in March which showed no significant change in ejection fraction and valvular abnormalities. There was moderate tricuspid regurgitation with moderate pulmonary hypertension. The mitral valve appeared degenerative with no significant prolapse. Mitral regurgitation was moderate to severe.  Repeat Echo on optimal medical therapy showed actually improvement in LV systolic function to an ejection fraction of 50-55%. The left atrium was moderately to severely dilated with evidence of severe mitral regurgitation with a posterior jet. There was also associated severe pulmonary hypertension with an estimated systolic pulmonary pressure of 72 mmHg. She underwent mitral valve replacement with a bioprosthetic valve,  tricuspid valve repair, one-vessel CABG with LIMA to LAD and maze procedure in August 2016. This was complicated by sternal wound infection which required debridement. She also had complete heart block and underwent permanent pacemaker placement. She has been doing reasonably well and started cardiac rehabilitation. She denies any chest pain. Sternal wound has almost completely healed.     Allergies  Allergen Reactions  . Augmentin [Amoxicillin-Pot Clavulanate] Swelling    Swollen joints, pain  . Nifedipine     Other reaction(s): Unknown  . Penicillins     Other reaction(s): Other (See Comments) Swollen joints  . Propranolol     Other reaction(s): Unknown  . Ace Inhibitors     Other reaction(s): Cough  . Diazepam     Other reaction(s): Other (See Comments) Made her "hyper"  . Meperidine     Other reaction(s): Other (See Comments) Made her "hyper"  . Propoxyphene     Other reaction(s): Other (See Comments) Made her "hyper"     Current Outpatient Prescriptions on File Prior to Visit  Medication Sig Dispense Refill  . acetaminophen (TYLENOL) 325 MG tablet Take 2 tablets (650 mg total) by mouth every 4 (four) hours as needed for mild pain (or Fever >/= 101).    Marland Kitchen amiodarone (PACERONE) 200 MG tablet Take 1 tablet (200 mg total) by mouth daily. 60 tablet 0  . aspirin EC 81 MG EC tablet Take 1 tablet (81 mg total) by mouth daily.    . benzonatate (TESSALON) 100 MG capsule Take 100 mg by mouth 3 (three) times daily as needed for cough (for cold).     . bisoprolol (ZEBETA) 10 MG tablet Take 1 tablet (10 mg total) by mouth daily. 30 tablet 5  . fluticasone (FLONASE) 50 MCG/ACT nasal spray Place 1 spray into both nostrils daily as needed for allergies.     Marland Kitchen  furosemide (LASIX) 20 MG tablet Take 20 mg by mouth 2 (two) times daily.     . Indacaterol Maleate 75 MCG CAPS Place into inhaler and inhale daily.    Marland Kitchen ipratropium-albuterol (DUONEB) 0.5-2.5 (3) MG/3ML SOLN Take 3 mLs by  nebulization every 6 (six) hours as needed (for shortness of breath).     Marland Kitchen levothyroxine (SYNTHROID, LEVOTHROID) 88 MCG tablet Take 88 mcg by mouth daily before breakfast.    . losartan (COZAAR) 50 MG tablet Take 1 tablet (50 mg total) by mouth daily. 90 tablet 3  . Multiple Vitamins-Minerals (PRESERVISION AREDS PO) Take 2 tablets by mouth daily.     . pantoprazole (PROTONIX) 40 MG tablet Take 40 mg by mouth 2 (two) times daily.     . potassium chloride SA (K-DUR,KLOR-CON) 20 MEQ tablet Take 1 tablet (20 mEq total) by mouth daily.    . simvastatin (ZOCOR) 20 MG tablet Take 20 mg by mouth daily.    Marland Kitchen spironolactone (ALDACTONE) 25 MG tablet Take 1 tablet (25 mg total) by mouth daily. 30 tablet 6  . warfarin (COUMADIN) 2.5 MG tablet Take 1 tablet (2.5 mg total) by mouth as directed. 30 tablet 3   No current facility-administered medications on file prior to visit.     Past Medical History  Diagnosis Date  . Hypertension   . COPD (chronic obstructive pulmonary disease) ([place])   . Hyperlipidemia   . Meniere disease   . HX: breast cancer   . Acute CHF (congestive heart failure) ([place])   . Atrial fibrillation ([place])   . Mitral regurgitation   . COPD (chronic obstructive pulmonary disease) ([place])   . Heart murmur     as child  . Hypothyroidism   . Cancer ([place])     hx breast cancer  . Breast cancer ([place]) 08/09/2001    right/rad  . Pulmonary hypertension ([place])   . Tricuspid regurgitation   . Chronic combined systolic and diastolic CHF (congestive heart failure) ([place])   . Coronary artery disease   . PONV (postoperative nausea and vomiting)   . Shortness of breath dyspnea     on exertion  . Anxiety   . GERD (gastroesophageal reflux disease)   . Arthritis   . s/p mitral valve replacement with bioprosthetic tissue valve 04/25/2015    27 mm [facility] Mitral bovine bioprosthetic tissue valve  . 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 S/P Maze  operation for atrial fibrillation 04/25/2015    Complete bilateral atrial lesion set using cryothermy and bipolar radiofrequency ablation with clipping of LA appendage  . S/P CABG x 1 04/25/2015    LIMA to LAD  . Post-surgical complete heart block, symptomatic 05/03/2015  . Presence of permanent cardiac pacemaker      Past Surgical History  Procedure Laterality Date  . Cochlear implant Bilateral   . Mastectomy, partial Right   . Cardiac catheterization  11/2013    [facility]  . Cardiac catheterization  10/2014    [facility]  . Breast biopsy Left 11/25/06    neg  . Breast biopsy Left 01/20/12    lt bx /clip-neg  . Eye surgery Bilateral 2014    cataract surgery  . Mitral valve repair N/A 04/25/2015    Procedure: MITRAL VALVE  REPLACEMENT using a 27 mm Edwards Perimount Magna Mitral Ease Valve;  Surgeon: Rexene Alberts, MD;  Location: [place];  Service: Open Heart Surgery;  Laterality: N/A;  .  Tricuspid valve replacement N/A 04/25/2015    Procedure: TRICUSPID VALVE REPAIR;  Surgeon: Rexene Alberts, MD;  Location: [place];  Service: Open Heart Surgery;  Laterality: N/A;  . Maze N/A 04/25/2015    Procedure: MAZE;  Surgeon: Rexene Alberts, MD;  Location: [place];  Service: Open Heart Surgery;  Laterality: N/A;  . Tee without cardioversion N/A 04/25/2015    Procedure: TRANSESOPHAGEAL ECHOCARDIOGRAM (TEE);  Surgeon: Rexene Alberts, MD;  Location: [place];  Service: Open Heart Surgery;  Laterality: N/A;  . Clipping of atrial appendage N/A 04/25/2015    Procedure: CLIPPING OF ATRIAL APPENDAGE;  Surgeon: Rexene Alberts, MD;  Location: [place];  Service: Open Heart Surgery;  Laterality: N/A;  . Ep implantable device N/A 05/02/2015    Procedure: Pacemaker Implant;  Surgeon: [place] Grayer, MD;  Location: [place] CV [facility];  Service: Cardiovascular;  Laterality: N/A;  . Sternal wound debridement N/A 05/09/2015    Procedure: STERNAL WOUND DEBRIDEMENT;  Surgeon: Rexene Alberts, MD;  Location: [place];  Service: Thoracic;   Laterality: N/A;  . Application of wound vac N/A 05/09/2015    Procedure: APPLICATION OF WOUND VAC;  Surgeon: Rexene Alberts, MD;  Location: [facility];  Service: Thoracic;  Laterality: N/A;  . Coronary artery bypass graft N/A 04/25/2015    Procedure: CORONARY ARTERY BYPASS GRAFTING (CABG) x ONE, using left internal mammary artery;  Surgeon: Rexene Alberts, MD;  Location: [place];  Service: Open Heart Surgery;  Laterality: N/A;  . Sternal wires removal N/A 06/05/2015    Procedure: STERNAL WIRES REMOVAL;  Surgeon: Rexene Alberts, MD;  Location: [facility];  Service: Thoracic;  Laterality: N/A;     Family History  Problem Relation Age of Onset  . Heart disease Father   . Heart disease Brother   . Heart attack Paternal Uncle      Social History   Social History  . Marital Status: Married    Spouse Name: N/A  . Number of Children: N/A  . Years of Education: N/A   Occupational History  . Not on file.   Social History Main Topics  . Smoking status: Former Smoker -- 40 years    Types: Cigarettes    Quit date: 04/20/1998  . Smokeless tobacco: Not on file  . Alcohol Use: No  . Drug Use: No  . Sexual Activity: Not on file   Other Topics Concern  . Not on file   Social History Narrative      PHYSICAL EXAM   BP 106/60 mmHg  Pulse 70  Ht 5\' 1"  (1.549 m)  Wt 133 lb 8 oz (60.555 kg)  BMI 25.24 kg/m2 Constitutional: She is oriented to person, place, and time. She appears well-developed and well-nourished. No distress.  HENT: No nasal discharge.  Head: Normocephalic and atraumatic.  Eyes: Pupils are equal and round. No discharge.  Neck: Normal range of motion. Neck supple. Mild JVD present. No thyromegaly present.  Cardiovascular: Normal rate, regular rhythm, normal heart sounds. Exam reveals no gallop and no friction rub. No murmurs. Pulmonary/Chest: Effort normal and breath sounds normal. No stridor. No respiratory distress. She has no wheezes. She has no rales. She exhibits no  tenderness.  Abdominal: Soft. Bowel sounds are normal. She exhibits no distension. There is no tenderness. There is no rebound and no guarding.  Musculoskeletal: Normal range of motion. She exhibits no edema and no tenderness.  Neurological: She is alert and oriented to person, place, and time.  Coordination normal.  Skin: Skin is warm and dry. No rash noted. She is not diaphoretic. No erythema. No pallor.  Psychiatric: She has a normal mood and affect. Her behavior is normal. Judgment and thought content normal.     EKG: AV sequential pacing at a rate of 70 bpm  ASSESSMENT AND PLAN

## 2015-07-17 ENCOUNTER — Encounter: Payer: Self-pay | Admitting: *Deleted

## 2015-07-17 ENCOUNTER — Encounter: Payer: Medicare PPO | Admitting: *Deleted

## 2015-07-17 DIAGNOSIS — Z952 Presence of prosthetic heart valve: Secondary | ICD-10-CM

## 2015-07-17 DIAGNOSIS — Z951 Presence of aortocoronary bypass graft: Secondary | ICD-10-CM

## 2015-07-17 DIAGNOSIS — T888XXA Other specified complications of surgical and medical care, not elsewhere classified, initial encounter: Secondary | ICD-10-CM | POA: Diagnosis not present

## 2015-07-17 NOTE — Progress Notes (Signed)
Daily Session Note  Patient Details  Name: [name] MRN: [id number] Date of Birth: [birth year] Referring Provider:  Wellington Hampshire, MD  Encounter Date: 07/17/2015  Check In:     Session Check In - 07/17/15 0836    Check-In   Staff Present Candiss Norse MS, ACSM CEP Exercise Physiologist;Susanne Bice RN, BSN, CCRP;Vila Dory Alfonso Patten, ACSM CEP Exercise Physiologist   ER physicians immediately available to respond to emergencies See telemetry face sheet for immediately available ER MD   Medication changes reported     No   Fall or balance concerns reported    No   Warm-up and Cool-down Performed on first and last piece of equipment   VAD Patient? No   Pain Assessment   Currently in Pain? No/denies   Multiple Pain Sites No         Goals Met:  Independence with exercise equipment Exercise tolerated well No report of cardiac concerns or symptoms Strength training completed today  Goals Unmet:  Not Applicable  Goals Comments: Patient completed exercise prescription and all exercise goals during rehab session. The exercise was tolerated well and the patient is progressing in the program.     Dr. Emily Filbert is Medical Director for [facility] and [facility].

## 2015-07-17 NOTE — Progress Notes (Signed)
Cardiac Individual Treatment Plan  Patient Details  Name: [name] MRN: [id number] Date of Birth: May 20, 1935 Referring Provider:  Wellington Hampshire, MD  Initial Encounter Date:    Visit Diagnosis: S/P CABG x 1  S/P MVR (mitral valve replacement)  Patient's Home Medications on Admission:  Current outpatient prescriptions:  .  acetaminophen (TYLENOL) 325 MG tablet, Take 2 tablets (650 mg total) by mouth every 4 (four) hours as needed for mild pain (or Fever >/= 101)., Disp: , Rfl:  .  amiodarone (PACERONE) 200 MG tablet, Take 1 tablet (200 mg total) by mouth daily., Disp: 60 tablet, Rfl: 0 .  aspirin EC 81 MG EC tablet, Take 1 tablet (81 mg total) by mouth daily., Disp: , Rfl:  .  benzonatate (TESSALON) 100 MG capsule, Take 100 mg by mouth 3 (three) times daily as needed for cough (for cold). , Disp: , Rfl:  .  bisoprolol (ZEBETA) 10 MG tablet, Take 1 tablet (10 mg total) by mouth daily., Disp: 30 tablet, Rfl: 5 .  fluticasone (FLONASE) 50 MCG/ACT nasal spray, Place 1 spray into both nostrils daily as needed for allergies. , Disp: , Rfl:  .  furosemide (LASIX) 20 MG tablet, Take 20 mg by mouth 2 (two) times daily. , Disp: , Rfl:  .  Indacaterol Maleate 75 MCG CAPS, Place into inhaler and inhale daily., Disp: , Rfl:  .  ipratropium-albuterol (DUONEB) 0.5-2.5 (3) MG/3ML SOLN, Take 3 mLs by nebulization every 6 (six) hours as needed (for shortness of breath). , Disp: , Rfl:  .  levothyroxine (SYNTHROID, LEVOTHROID) 88 MCG tablet, Take 88 mcg by mouth daily before breakfast., Disp: , Rfl:  .  losartan (COZAAR) 50 MG tablet, Take 1 tablet (50 mg total) by mouth daily., Disp: 90 tablet, Rfl: 3 .  Multiple Vitamins-Minerals (PRESERVISION AREDS PO), Take 2 tablets by mouth daily. , Disp: , Rfl:  .  pantoprazole (PROTONIX) 40 MG tablet, Take 40 mg by mouth 2 (two) times daily. , Disp: , Rfl:  .  potassium chloride SA (K-DUR,KLOR-CON) 20 MEQ tablet, Take 1 tablet (20 mEq total) by mouth daily.,  Disp: , Rfl:  .  simvastatin (ZOCOR) 20 MG tablet, Take 20 mg by mouth daily., Disp: , Rfl:  .  spironolactone (ALDACTONE) 25 MG tablet, Take 1 tablet (25 mg total) by mouth daily., Disp: 30 tablet, Rfl: 6 .  warfarin (COUMADIN) 2.5 MG tablet, Take 1 tablet (2.5 mg total) by mouth as directed., Disp: 30 tablet, Rfl: 3  Past Medical History: Past Medical History  Diagnosis Date  . Hypertension   . COPD (chronic obstructive pulmonary disease) ([place])   . Hyperlipidemia   . Meniere disease   . HX: breast cancer   . Acute CHF (congestive heart failure) ([place])   . Atrial fibrillation ([place])   . Mitral regurgitation   . COPD (chronic obstructive pulmonary disease) ([place])   . Heart murmur     as child  . Hypothyroidism   . Cancer ([place])     hx breast cancer  . Breast cancer ([place]) 08/09/2001    right/rad  . Pulmonary hypertension ([place])   . Tricuspid regurgitation   . Chronic combined systolic and diastolic CHF (congestive heart failure) ([place])   . Coronary artery disease   . PONV (postoperative nausea and vomiting)   . Shortness of breath dyspnea     on exertion  . Anxiety   . GERD (gastroesophageal reflux disease)   . Arthritis   . s/p  mitral valve replacement with bioprosthetic tissue valve 04/25/2015    27 mm [facility] Mitral bovine bioprosthetic tissue valve  . 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 S/P Maze operation for atrial fibrillation 04/25/2015    Complete bilateral atrial lesion set using cryothermy and bipolar radiofrequency ablation with clipping of LA appendage  . S/P CABG x 1 04/25/2015    LIMA to LAD  . Post-surgical complete heart block, symptomatic 05/03/2015  . Presence of permanent cardiac pacemaker     Tobacco Use: History  Smoking status  . Former Smoker -- 57 years  . Types: Cigarettes  . Quit date: 04/20/1998  Smokeless tobacco  . Not on file    Labs: Recent Review Flowsheet Data    Labs for ITP Cardiac and  Pulmonary Rehab Latest Ref Rng 04/26/2015 04/27/2015 04/28/2015 04/29/2015 04/30/2015   TCO2 0 - 100 mmol/L 23 - - 34 34   O2SAT - - 68.5 65.3 61.7 -       Exercise Target Goals:    Exercise Program Goal: Individual exercise prescription set with THRR, safety & activity barriers. Participant demonstrates ability to understand and report RPE using BORG scale, to self-measure pulse accurately, and to acknowledge the importance of the exercise prescription.  Exercise Prescription Goal: Starting with aerobic activity 30 plus minutes a day, 3 days per week for initial exercise prescription. Provide home exercise prescription and guidelines that participant acknowledges understanding prior to discharge.  Activity Barriers & Risk Stratification:     Activity Barriers & Risk Stratification - 07/03/15 1335    Activity Barriers & Risk Stratification   Activity Barriers Balance Concerns;Deconditioning;Joint Problems  Meineres disease= balance concerns, bursitis left hip and leg, sternal open wound   Risk Stratification Moderate      6 Minute Walk:     6 Minute Walk      07/03/15 1424       6 Minute Walk   Phase Initial     Distance 450 feet  NS Test: 450 steps     Walk Time 6 minutes     Resting HR 69 bpm     Resting BP 132/80 mmHg     Max Ex. HR 84 bpm     Max Ex. BP 138/74 mmHg     RPE 11     Symptoms No        Initial Exercise Prescription:     Initial Exercise Prescription - 07/03/15 1400    Date of Initial Exercise Prescription   Date 07/03/15   Bike   Level 0.5   Minutes 15   Recumbant Bike   Level 2   RPM 30   Watts 20   Minutes 15   NuStep   Level 2   Watts 30   Minutes 15   Cybex   Level 1   RPM 30   Minutes 15   Recumbant Elliptical   Level 1   Watts 20   Minutes 15   REL-XR   Level 2   Watts 30   Minutes 15   Prescription Details   Frequency (times per week) 3   Duration Progress to 30 minutes of continuous aerobic without signs/symptoms of  physical distress   Intensity   THRR REST +  30   Progression Continue progressive overload as per policy without signs/symptoms or physical distress.  no upper body exercise due to sternal wound precautions   Resistance Training  Training Prescription No  ROM only due to sternal wound precautions      Exercise Prescription Changes:     Exercise Prescription Changes      07/11/15 0600           Exercise Review   Progression No  Has not started program yet; med review was 07/03/15          Discharge Exercise Prescription (Final Exercise Prescription Changes):     Exercise Prescription Changes - 07/11/15 0600    Exercise Review   Progression No  Has not started program yet; med review was 07/03/15      Nutrition:  Target Goals: Understanding of nutrition guidelines, daily intake of sodium <1533m, cholesterol <2050m calories 30% from fat and 7% or less from saturated fats, daily to have 5 or more servings of fruits and vegetables.  Biometrics:     Pre Biometrics - 07/03/15 1431    Pre Biometrics   Height _0  (1.549 m)   Weight 136 lb 8 oz (61.916 kg)   Waist Circumference 34.5 inches   Hip Circumference 38.5 inches   Waist to Hip Ratio 0.9 %   BMI (Calculated) 25.8       Nutrition Therapy Plan and Nutrition Goals:   Nutrition Discharge: Rate Your Plate Scores:   Nutrition Goals Re-Evaluation:   Psychosocial: Target Goals: Acknowledge presence or absence of depression, maximize coping skills, provide positive support system. Participant is able to verbalize types and ability to use techniques and skills needed for reducing stress and depression.  Initial Review & Psychosocial Screening:     Initial Psych Review & Screening - 07/03/15 1342    Family Dynamics   Good Support System? Yes   Barriers   Psychosocial barriers to participate in program There are no identifiable barriers or psychosocial needs.;The patient should benefit from training in  stress management and relaxation.   Screening Interventions   Interventions Encouraged to exercise      Quality of Life Scores:     Quality of Life - 07/03/15 1446    Quality of Life Scores   Health/Function Pre 22.62 %   Socioeconomic Pre 29.58 %   Psych/Spiritual Pre 30 %   Family Pre 30 %   GLOBAL Pre 26.6 %      PHQ-9:     Recent Review Flowsheet Data    [facility]   Decreased Interest 0   Down, Depressed, Hopeless 0   PHQ - 2 Score 0   Altered sleeping 0   Tired, decreased energy 3   Change in appetite 0   Feeling bad or failure about yourself  0   Trouble concentrating 0   Moving slowly or fidgety/restless 0   Suicidal thoughts 0   PHQ-9 Score 3   Difficult doing work/chores Somewhat difficult      Psychosocial Evaluation and Intervention:     Psychosocial Evaluation - 07/12/15 0936    Psychosocial Evaluation & Interventions   Interventions Stress management education;Encouraged to exercise with the program and follow exercise prescription;Relaxation education   Comments Counselor met with [name] today for initial psychosocial evaluation.  It is her first day in Cardiac Rehab and she was energetic and working hard already!  [name] a [age]ear old who had open heart surgery in August followed by subsequent procedures and an infection resulting in 5 weeks in the hospital.  She has a strong support system with a spouse of [id number] and  several siblings who live closeby.  [name] reports that she sleeps well and typically has a good appetite.  She denies a history  of depression or anxiety or any current symptoms.  She states her mood is generally positive and her doctor has released her to return to "normal activities" with pacing herself.  [name] has goals in this program to increase her strength and stamina and to improve her balance - as she occasionally has to use a walker.  She will benefit from the consistent exercise  aspect of this program.     Continued Psychosocial Services Needed No      Psychosocial Re-Evaluation:   Vocational Rehabilitation: Provide vocational rehab assistance to qualifying candidates.   Vocational Rehab Evaluation & Intervention:     Vocational Rehab - 07/03/15 1337    Initial Vocational Rehab Evaluation & Intervention   Assessment shows need for Vocational Rehabilitation No      Education: Education Goals: Education classes will be provided on a weekly basis, covering required topics. Participant will state understanding/return demonstration of topics presented.  Learning Barriers/Preferences:     Learning Barriers/Preferences - 07/03/15 1336    Learning Barriers/Preferences   Learning Barriers Hearing   Learning Preferences Video      Education Topics: General Nutrition Guidelines/Fats and Fiber: -Group instruction provided by verbal, written material, models and posters to present the general guidelines for heart healthy nutrition. Gives an explanation and review of dietary fats and fiber.   Controlling Sodium/Reading Food Labels: -Group verbal and written material supporting the discussion of sodium use in heart healthy nutrition. Review and explanation with models, verbal and written materials for utilization of the food label.   Exercise Physiology & Risk Factors: - Group verbal and written instruction with models to review the exercise physiology of the cardiovascular system and associated critical values. Details cardiovascular disease risk factors and the goals associated with each risk factor.          Cardiac Rehab from 07/17/2015 in [facility]   Date  07/12/15   Educator  RM   Instruction Review Code  2- meets goals/outcomes      Aerobic Exercise & Resistance Training: - Gives group verbal and written discussion on the health impact of inactivity. On the components of aerobic and resistive training programs and the benefits of this  training and how to safely progress through these programs.      Cardiac Rehab from 07/17/2015 in [facility]   Date  07/17/15   Educator  RM   Instruction Review Code  2- meets goals/outcomes      Flexibility, Balance, General Exercise Guidelines: - Provides group verbal and written instruction on the benefits of flexibility and balance training programs. Provides general exercise guidelines with specific guidelines to those with heart or lung disease. Demonstration and skill practice provided.   Stress Management: - Provides group verbal and written instruction about the health risks of elevated stress, cause of high stress, and healthy ways to reduce stress.   Depression: - Provides group verbal and written instruction on the correlation between heart/lung disease and depressed mood, treatment options, and the stigmas associated with seeking treatment.   Anatomy & Physiology of the Heart: - Group verbal and written instruction and models provide basic cardiac anatomy and physiology, with the coronary electrical and arterial systems. Review of: AMI, Angina, Valve disease, Heart Failure, Cardiac Arrhythmia, Pacemakers, and the ICD.   Cardiac Procedures: - Group verbal and written instruction and  models to describe the testing methods done to diagnose heart disease. Reviews the outcomes of the test results. Describes the treatment choices: Medical Management, Angioplasty, or Coronary Bypass Surgery.   Cardiac Medications: - Group verbal and written instruction to review commonly prescribed medications for heart disease. Reviews the medication, class of the drug, and side effects. Includes the steps to properly store meds and maintain the prescription regimen.   Go Sex-Intimacy & Heart Disease, Get SMART - Goal Setting: - Group verbal and written instruction through game format to discuss heart disease and the return to sexual intimacy. Provides group verbal and written material  to discuss and apply goal setting through the application of the S.M.A.R.T. Method.   Other Matters of the Heart: - Provides group verbal, written materials and models to describe Heart Failure, Angina, Valve Disease, and Diabetes in the realm of heart disease. Includes description of the disease process and treatment options available to the cardiac patient.   Exercise & Equipment Safety: - Individual verbal instruction and demonstration of equipment use and safety with use of the equipment.      Cardiac Rehab from 07/17/2015 in [facility]   Date  07/03/15   Educator  SB   Instruction Review Code  2- meets goals/outcomes      Infection Prevention: - Provides verbal and written material to individual with discussion of infection control including proper hand washing and proper equipment cleaning during exercise session.      Cardiac Rehab from 07/17/2015 in [facility]   Date  07/03/15   Educator  Sb   Instruction Review Code  2- meets goals/outcomes      Falls Prevention: - Provides verbal and written material to individual with discussion of falls prevention and safety.      Cardiac Rehab from 07/17/2015 in [facility]   Date  07/03/15   Educator  Sb   Instruction Review Code  2- meets goals/outcomes      Diabetes: - Individual verbal and written instruction to review signs/symptoms of diabetes, desired ranges of glucose level fasting, after meals and with exercise. Advice that pre and post exercise glucose checks will be done for 3 sessions at entry of program.    Knowledge Questionnaire Score:     Knowledge Questionnaire Score - 07/03/15 1440    Knowledge Questionnaire Score   Pre Score 26/28      Personal Goals and Risk Factors at Admission:     Personal Goals and Risk Factors at Admission - 07/03/15 1339    Personal Goals and Risk Factors on Admission   Increase Aerobic Exercise and Physical Activity Yes;Sedentary  Balance concerns ,using  walker for ambulation.   Intervention While in program, learn and follow the exercise prescription taught. Start at a low level workload and increase workload after able to maintain previous level for 30 minutes. Increase time before increasing intensity.   Intervention Provide exercise education and an individualized exercise prescription that will provide continued progressive overload as per policy without signs/symptoms of physical distress.   Diabetes No   Hypertension Yes   Goal Participant will see blood pressure controlled within the values of 140/59m/Hg or within value directed by their physician.   Intervention Provide nutrition & aerobic exercise along with prescribed medications to achieve BP 140/90 or less.   Lipids Yes   Goal Cholesterol controlled with medications as prescribed, with individualized exercise RX and with personalized nutrition plan. Value goals: LDL < 729m HDL > 4083mParticipant  states understanding of desired cholesterol values and following prescriptions.   Intervention Provide nutrition & aerobic exercise along with prescribed medications to achieve LDL <53m, HDL >448m      Personal Goals and Risk Factors Review:      Goals and Risk Factor Review      07/17/15 1007           Increase Aerobic Exercise and Physical Activity   Comments Patient discussed her desire to get back to "normal" and was talking about some of the exercise she used to do before he heart episode. She said that after her first week of class she was a little sore. We discussed that that can be a normal response of the body when a new activity is preformed, and how over time the soreness could go away once he muscles get stronger and used to the new activity. Education on cross training/ the body's response to new activities was given.           Personal Goals Discharge (Final Personal Goals and Risk Factors Review):      Goals and Risk Factor Review - 07/17/15 1007    Increase  Aerobic Exercise and Physical Activity   Comments Patient discussed her desire to get back to "normal" and was talking about some of the exercise she used to do before he heart episode. She said that after her first week of class she was a little sore. We discussed that that can be a normal response of the body when a new activity is preformed, and how over time the soreness could go away once he muscles get stronger and used to the new activity. Education on cross training/ the body's response to new activities was given.        Comments: 30 day review. Continue with ITP. NoBrittaniaas just started program.

## 2015-07-19 ENCOUNTER — Ambulatory Visit (INDEPENDENT_AMBULATORY_CARE_PROVIDER_SITE_OTHER): Payer: Medicare PPO | Admitting: *Deleted

## 2015-07-19 ENCOUNTER — Other Ambulatory Visit: Payer: Self-pay | Admitting: *Deleted

## 2015-07-19 DIAGNOSIS — I4891 Unspecified atrial fibrillation: Secondary | ICD-10-CM | POA: Diagnosis not present

## 2015-07-19 DIAGNOSIS — Z951 Presence of aortocoronary bypass graft: Secondary | ICD-10-CM

## 2015-07-19 DIAGNOSIS — Z953 Presence of xenogenic heart valve: Secondary | ICD-10-CM | POA: Diagnosis not present

## 2015-07-19 DIAGNOSIS — I340 Nonrheumatic mitral (valve) insufficiency: Secondary | ICD-10-CM | POA: Diagnosis not present

## 2015-07-19 DIAGNOSIS — Z952 Presence of prosthetic heart valve: Secondary | ICD-10-CM

## 2015-07-19 DIAGNOSIS — T888XXA Other specified complications of surgical and medical care, not elsewhere classified, initial encounter: Secondary | ICD-10-CM | POA: Diagnosis not present

## 2015-07-19 LAB — POCT INR: INR: 3

## 2015-07-19 NOTE — Progress Notes (Signed)
Daily Session Note  Patient Details  Name: [name] MRN: [id number] Date of Birth: Jan 10, 1935 Referring Provider:  Wellington Hampshire, MD  Encounter Date: 07/19/2015  Check In:     Session Check In - 07/19/15 0851    Check-In   Staff Present Heath Lark RN, BSN, CCRP;Philopateer Strine BS, ACSM EP-C, Exercise Physiologist;Renee Dillard Essex MS, ACSM CEP Exercise Physiologist   ER physicians immediately available to respond to emergencies See telemetry face sheet for immediately available ER MD   Medication changes reported     No   Fall or balance concerns reported    No   Warm-up and Cool-down Performed on first and last piece of equipment   VAD Patient? No   Pain Assessment   Currently in Pain? No/denies         Goals Met:  Proper associated with RPD/PD & O2 Sat Exercise tolerated well No report of cardiac concerns or symptoms Strength training completed today  Goals Unmet:  Not Applicable  Goals Comments:   Dr. Emily Filbert is Medical Director for [place] and [facility].

## 2015-07-21 ENCOUNTER — Telehealth: Payer: Self-pay | Admitting: *Deleted

## 2015-07-21 ENCOUNTER — Encounter: Payer: Medicare PPO | Admitting: *Deleted

## 2015-07-21 DIAGNOSIS — Z951 Presence of aortocoronary bypass graft: Secondary | ICD-10-CM

## 2015-07-21 DIAGNOSIS — T888XXA Other specified complications of surgical and medical care, not elsewhere classified, initial encounter: Secondary | ICD-10-CM | POA: Diagnosis not present

## 2015-07-21 DIAGNOSIS — Z952 Presence of prosthetic heart valve: Secondary | ICD-10-CM

## 2015-07-21 NOTE — Progress Notes (Signed)
Daily Session Note  Patient Details  Name: [name] MRN: [id number] Date of Birth: Dec 11, 1934 Referring Provider:  Wellington Hampshire, MD  Encounter Date: 07/21/2015  Check In:     Session Check In - 07/21/15 0913    Check-In   Staff Present Heath Lark RN, BSN, CCRP;Renee Dillard Essex MS, ACSM CEP Exercise Physiologist;Carroll Enterkin RN, BSN   ER physicians immediately available to respond to emergencies See telemetry face sheet for immediately available ER MD   Medication changes reported     No   Fall or balance concerns reported    No   Warm-up and Cool-down Performed on first and last piece of equipment   VAD Patient? No   Pain Assessment   Currently in Pain? No/denies         Goals Met:  Independence with exercise equipment Exercise tolerated well No report of cardiac concerns or symptoms Strength training completed today  Goals Unmet:  Not Applicable  Goals Comments:     Dr. Emily Filbert is Medical Director for [place] and [facility].

## 2015-07-21 NOTE — Telephone Encounter (Signed)
[facility] nurse called for orders for skilled nursing. Please call Ewell Poe [phone].

## 2015-07-21 NOTE — Telephone Encounter (Signed)
Left message for Anderson Malta at [facility] of orders.  Awaiting MD signature

## 2015-07-24 ENCOUNTER — Encounter: Payer: Medicare PPO | Admitting: *Deleted

## 2015-07-24 DIAGNOSIS — Z952 Presence of prosthetic heart valve: Secondary | ICD-10-CM

## 2015-07-24 DIAGNOSIS — T888XXA Other specified complications of surgical and medical care, not elsewhere classified, initial encounter: Secondary | ICD-10-CM | POA: Diagnosis not present

## 2015-07-24 DIAGNOSIS — Z951 Presence of aortocoronary bypass graft: Secondary | ICD-10-CM

## 2015-07-24 NOTE — Progress Notes (Signed)
Daily Session Note  Patient Details  Name: [name] MRN: [id number] Date of Birth: 16-Nov-1934 Referring Provider:  Wellington Hampshire, MD  Encounter Date: 07/24/2015  Check In:     Session Check In - 07/24/15 0839    Check-In   Staff Present Candiss Norse MS, ACSM CEP Exercise Physiologist;Susanne Bice RN, BSN, CCRP;Zaron Zwiefelhofer Alfonso Patten, ACSM CEP Exercise Physiologist   ER physicians immediately available to respond to emergencies See telemetry face sheet for immediately available ER MD   Medication changes reported     No   Fall or balance concerns reported    No   Warm-up and Cool-down Performed on first and last piece of equipment   VAD Patient? No   Pain Assessment   Currently in Pain? No/denies   Multiple Pain Sites No         Goals Met:  Independence with exercise equipment Exercise tolerated well No report of cardiac concerns or symptoms Strength training completed today  Goals Unmet:  Not Applicable  Goals Comments: Patient completed exercise prescription and all exercise goals during rehab session. The exercise was tolerated well and the patient is progressing in the program.     Dr. Emily Filbert is Medical Director for [place] and [facility].

## 2015-07-26 DIAGNOSIS — T888XXA Other specified complications of surgical and medical care, not elsewhere classified, initial encounter: Secondary | ICD-10-CM | POA: Diagnosis not present

## 2015-07-26 DIAGNOSIS — Z951 Presence of aortocoronary bypass graft: Secondary | ICD-10-CM

## 2015-07-26 DIAGNOSIS — Z952 Presence of prosthetic heart valve: Secondary | ICD-10-CM

## 2015-07-26 NOTE — Progress Notes (Signed)
Daily Session Note  Patient Details  Name: EUN VERMEER MRN: [id number] Date of Birth: 1935-04-10 Referring Provider:  Wellington Hampshire, MD  Encounter Date: 07/26/2015  Check In:     Session Check In - 07/26/15 0826    Check-In   Staff Present Lestine Box BS, ACSM EP-C, Exercise Physiologist;Renee Dillard Essex MS, ACSM CEP Exercise Physiologist;Carroll Enterkin RN, BSN   ER physicians immediately available to respond to emergencies See telemetry face sheet for immediately available ER MD   Medication changes reported     No   Fall or balance concerns reported    No   Warm-up and Cool-down Performed on first and last piece of equipment   VAD Patient? No   Pain Assessment   Currently in Pain? No/denies         Goals Met:  Proper associated with RPD/PD & O2 Sat Exercise tolerated well No report of cardiac concerns or symptoms Strength training completed today  Goals Unmet:  Not Applicable  Goals Comments:    Dr. Emily Filbert is Medical Director for [place] and [facility].

## 2015-07-27 ENCOUNTER — Encounter: Payer: Self-pay | Admitting: Pulmonary Disease

## 2015-07-27 ENCOUNTER — Ambulatory Visit (INDEPENDENT_AMBULATORY_CARE_PROVIDER_SITE_OTHER): Payer: Medicare PPO | Admitting: Pulmonary Disease

## 2015-07-27 VITALS — BP 120/60 | HR 69 | Ht 61.0 in | Wt 135.0 lb

## 2015-07-27 DIAGNOSIS — R0902 Hypoxemia: Secondary | ICD-10-CM | POA: Diagnosis not present

## 2015-07-27 DIAGNOSIS — J449 Chronic obstructive pulmonary disease, unspecified: Secondary | ICD-10-CM

## 2015-07-27 MED ORDER — UMECLIDINIUM BROMIDE 62.5 MCG/INH IN AEPB: 1.0000 | INHALATION_SPRAY | Freq: Every day | RESPIRATORY_TRACT | Status: AC

## 2015-07-27 MED ORDER — UMECLIDINIUM BROMIDE 62.5 MCG/INH IN AEPB: 1.0000 | INHALATION_SPRAY | Freq: Every day | RESPIRATORY_TRACT | Status: DC

## 2015-07-27 NOTE — Patient Instructions (Signed)
Trial of Incruse - you may try on and off Continue with the exercise program Follow up in 4 weeks. Be able to tell me if the Incruse has been beneficial

## 2015-07-27 NOTE — Progress Notes (Signed)
PULMONARY CONSULT NOTE  Requesting MD/Service: Self referral Date of consult: 07/27/15 Reason for consultation: COPD  HPI:  73 F here to establish care for COPD. Previously followed by Dr Raul Del. She was first diagnosed with COPD approx 4 yrs ago at the time of a pneumonia (was not hospitalized). She has been treated with bronchodilator therapy. She has been on oxygen therapy since 12/15. Notably, at that time, she was suffering from CHF. She has since undergone Maze MVR and single vessel CABG. Her post op course was complicated by a wound infection. She has now recovered fully from that and is participating in cardiac rehab program. She is presently limited by fatigue and deconditioning more than by dyspnea. She walks slowly on a treadmill for 20 mins @ cardiac rehab. She can complete a flight of stairs. She has minimal cough and very infrequent sputum production.  She has been tried on [facility] which caused hoarseness. She was changed to a LABA which was not discernibly beneficial. She almost never uses her rescue inhaler. She also has a nebulizer @ home with Duoneb but she has not used this in months. She denies fever, hemoptysis, LE edema. She is up to date on flu and pneumococcal vaccines.   Past Medical History  Diagnosis Date  . Hypertension   . COPD (chronic obstructive pulmonary disease) ([place])   . Hyperlipidemia   . Meniere disease   . HX: breast cancer   . Acute CHF (congestive heart failure) ([place])   . Atrial fibrillation ([place])   . Mitral regurgitation   . COPD (chronic obstructive pulmonary disease) ([place])   . Heart murmur     as child  . Hypothyroidism   . Cancer ([place])     hx breast cancer  . Breast cancer ([place]) 08/09/2001    right/rad  . Pulmonary hypertension ([place])   . Tricuspid regurgitation   . Chronic combined systolic and diastolic CHF (congestive heart failure) ([place])   . Coronary artery disease   . PONV (postoperative nausea and vomiting)   . Shortness of breath  dyspnea     on exertion  . Anxiety   . GERD (gastroesophageal reflux disease)   . Arthritis   . s/p mitral valve replacement with bioprosthetic tissue valve 04/25/2015    27 mm [facility] Mitral bovine bioprosthetic tissue valve  . 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 S/P Maze operation for atrial fibrillation 04/25/2015    Complete bilateral atrial lesion set using cryothermy and bipolar radiofrequency ablation with clipping of LA appendage  . S/P CABG x 1 04/25/2015    LIMA to LAD  . Post-surgical complete heart block, symptomatic 05/03/2015  . Presence of permanent cardiac pacemaker    Past Surgical History  Procedure Laterality Date  . Cochlear implant Bilateral   . Mastectomy, partial Right   . Cardiac catheterization  11/2013    [facility]  . Cardiac catheterization  10/2014    [facility] [address]  . Breast biopsy Left 11/25/06    neg  . Breast biopsy Left 01/20/12    lt bx /clip-neg  . Eye surgery Bilateral 2014    cataract surgery  . Mitral valve repair N/A 04/25/2015    Procedure: MITRAL VALVE  REPLACEMENT using a 27 mm Edwards Perimount Magna Mitral Ease Valve;  Surgeon: Rexene Alberts, MD;  Location: [place];  Service: Open Heart Surgery;  Laterality: N/A;  . Tricuspid valve replacement N/A 04/25/2015    Procedure:  TRICUSPID VALVE REPAIR;  Surgeon: Rexene Alberts, MD;  Location: [place];  Service: Open Heart Surgery;  Laterality: N/A;  . Maze N/A 04/25/2015    Procedure: MAZE;  Surgeon: Rexene Alberts, MD;  Location: [place];  Service: Open Heart Surgery;  Laterality: N/A;  . Tee without cardioversion N/A 04/25/2015    Procedure: TRANSESOPHAGEAL ECHOCARDIOGRAM (TEE);  Surgeon: Rexene Alberts, MD;  Location: [place];  Service: Open Heart Surgery;  Laterality: N/A;  . Clipping of atrial appendage N/A 04/25/2015    Procedure: CLIPPING OF ATRIAL APPENDAGE;  Surgeon: Rexene Alberts, MD;  Location: [place];  Service: Open Heart Surgery;  Laterality: N/A;  . Ep  implantable device N/A 05/02/2015    Procedure: Pacemaker Implant;  Surgeon: [place] Grayer, MD;  Location: [place] CV LAB;  Service: Cardiovascular;  Laterality: N/A;  . Sternal wound debridement N/A 05/09/2015    Procedure: STERNAL WOUND DEBRIDEMENT;  Surgeon: Rexene Alberts, MD;  Location: [place];  Service: Thoracic;  Laterality: N/A;  . Application of wound vac N/A 05/09/2015    Procedure: APPLICATION OF WOUND VAC;  Surgeon: Rexene Alberts, MD;  [facility];  Service: Thoracic;  Laterality: N/A;  . Coronary artery bypass graft N/A 04/25/2015    Procedure: CORONARY ARTERY BYPASS GRAFTING (CABG) x ONE, using left internal mammary artery;  Surgeon: Rexene Alberts, MD;  Location: [place];  Service: Open Heart Surgery;  Laterality: N/A;  . Sternal wires removal N/A 06/05/2015    Procedure: STERNAL WIRES REMOVAL;  Surgeon: Rexene Alberts, MD;  [facility];  Service: Thoracic;  Laterality: N/A;    MEDICATIONS: reviewed  Social History   Social History  . Marital Status: Married    Spouse Name: N/A  . Number of Children: N/A  . Years of Education: N/A   Occupational History  . Not on file.   Social History Main Topics  . Smoking status: Former Smoker -- 40 years    Types: Cigarettes    Quit date: 04/20/1998  . Smokeless tobacco: Not on file  . Alcohol Use: No  . Drug Use: No  . Sexual Activity: Not on file   Other Topics Concern  . Not on file   Social History Narrative    Family History  Problem Relation Age of Onset  . Heart disease Father   . Heart disease Brother   . Heart attack Paternal Uncle     ROS - Negative except as above  Filed Vitals:   07/27/15 0857  BP: 120/60  Pulse: 69  Height: 5\' 1"  (1.549 m)  Weight: 135 lb (61.236 kg)  SpO2: 100%     EXAM:   Gen: WDWN in NAD HEENT: All WNL Neck: NO LAN, no JVD noted Lungs: slightly diminshed BS, normal percussion note throughout, no adventitious sounds Cardiovascular: Reg, no M noted Abdomen:  Soft, NT +BS Ext: no C/C/E Neuro: CNs intact, motor/sens grossly intact Skin: No lesions noted   DATA:  05/08/15 CT chest:  IMPRESSION: 1. Expected postoperative changes associated with recent CABG procedure. 2. No subcutaneous fluid collections identified overlying the sternotomy defect. The components of the sternotomy defect appear well approximated and there is no evidence for sternal wire fracture. 3. A small low-attenuation fluid collection is identified within the anterior mediastinum/retrosternal space. This is nonspecific finding in the early postoperative time frame. 4. Small pleural effusions. 5. Prior granulomatous disease  05/22/15 CXR: Hyperinflation. NAD  03/17/15 PFTs: mild-mod obstruction, normal lung volumes,  severe decrease in DLCO  07/27/15 RA SpO2 - 99%  IMPRESSION:   Chronic obstructive pulmonary disease, unspecified COPD type (HCC) Hypoxemia - improved to resolved. Might have been due to CHF   PLAN:  Trial of Incruse - once inhalation daily Change O2 to nocturnal and PRN during day ROV 4 weeks to assess response to Delene Ruffini, MD PCCM service Mobile [phone] Pager [phone]

## 2015-08-01 ENCOUNTER — Ambulatory Visit (INDEPENDENT_AMBULATORY_CARE_PROVIDER_SITE_OTHER): Payer: Medicare PPO

## 2015-08-01 ENCOUNTER — Encounter: Payer: Self-pay | Admitting: Internal Medicine

## 2015-08-01 ENCOUNTER — Ambulatory Visit (INDEPENDENT_AMBULATORY_CARE_PROVIDER_SITE_OTHER): Payer: Medicare PPO | Admitting: Internal Medicine

## 2015-08-01 VITALS — BP 116/60 | HR 70 | Ht 62.0 in | Wt 135.0 lb

## 2015-08-01 DIAGNOSIS — I340 Nonrheumatic mitral (valve) insufficiency: Secondary | ICD-10-CM

## 2015-08-01 DIAGNOSIS — I5042 Chronic combined systolic (congestive) and diastolic (congestive) heart failure: Secondary | ICD-10-CM

## 2015-08-01 DIAGNOSIS — Z953 Presence of xenogenic heart valve: Secondary | ICD-10-CM

## 2015-08-01 DIAGNOSIS — R001 Bradycardia, unspecified: Secondary | ICD-10-CM

## 2015-08-01 DIAGNOSIS — I4891 Unspecified atrial fibrillation: Secondary | ICD-10-CM

## 2015-08-01 LAB — POCT INR: INR: 2.7

## 2015-08-01 NOTE — Patient Instructions (Addendum)
Medication Instructions: - no changes  Labwork: - none  Procedures/Testing: - none  Follow-Up: - Remote monitoring is used to monitor your Pacemaker of ICD from home. This monitoring reduces the number of office visits required to check your device to one time per year. It allows [place] to keep an eye on the functioning of your device to ensure it is working properly. You are scheduled for a device check from home on 10/31/15. You may send your transmission at any time that day. If you have a wireless device, the transmission will be sent automatically. After your physician reviews your transmission, you will receive a postcard with your next transmission date.  - Your physician wants you to follow-up in: 9 months with Dr. Caryl Comes (August 2017). You will receive a reminder letter in the mail two months in advance. If you don't receive a letter, please call our office to schedule the follow-up appointment.  Any Additional Special Instructions Will Be Listed Below (If Applicable).

## 2015-08-01 NOTE — Progress Notes (Signed)
Patient Care Team: Ezequiel Kayser, MD as PCP - General (Internal Medicine)   HPI  [name] is a 80 y.o. female Seen in follow-up for a pacemaker implanted 8/16 in the context of multi-valvular surgery bioprosthetic mitral valve replacement, tricuspid valve repair and MAZE operation. She developed postoperative complete heart block [facility])  She has a history of atrial fibrillation  Records and Results Reviewed  Including hospital records. Echocardiogram 5/16 demonstrated normal LV function. Intraoperative TEE has not yet been dictated  She has significant fatigue it is gradually abating. She has been improved w cardiac rehab  Past Medical History  Diagnosis Date  . Hypertension   . COPD (chronic obstructive pulmonary disease) ([place])   . Hyperlipidemia   . Meniere disease   . HX: breast cancer   . Acute CHF (congestive heart failure) ([place])   . Atrial fibrillation ([place])   . Mitral regurgitation   . COPD (chronic obstructive pulmonary disease) ([place])   . Heart murmur     as child  . Hypothyroidism   . Cancer ([place])     hx breast cancer  . Breast cancer ([place]) 08/09/2001    right/rad  . Pulmonary hypertension ([place])   . Tricuspid regurgitation   . Chronic combined systolic and diastolic CHF (congestive heart failure) ([place])   . Coronary artery disease   . PONV (postoperative nausea and vomiting)   . Shortness of breath dyspnea     on exertion  . Anxiety   . GERD (gastroesophageal reflux disease)   . Arthritis   . s/p mitral valve replacement with bioprosthetic tissue valve 04/25/2015    27 mm [facility] Mitral bovine bioprosthetic tissue valve  . 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 S/P Maze operation for atrial fibrillation 04/25/2015    Complete bilateral atrial lesion set using cryothermy and bipolar radiofrequency ablation with clipping of LA appendage  . S/P CABG x 1 04/25/2015    LIMA to LAD  . Post-surgical complete heart  block, symptomatic 05/03/2015  . Presence of permanent cardiac pacemaker     Past Surgical History  Procedure Laterality Date  . Cochlear implant Bilateral   . Mastectomy, partial Right   . Cardiac [facility]  11/2013    [facility]  . Cardiac [facility]ization  10/2014    [facility]  . Breast biopsy Left 11/25/06    neg  . Breast biopsy Left 01/20/12    lt bx /clip-neg  . Eye surgery Bilateral 2014    cataract surgery  . Mitral valve repair N/A 04/25/2015    Procedure: MITRAL VALVE  REPLACEMENT using a 27 mm Edwards Perimount Magna Mitral Ease Valve;  Surgeon: Rexene Alberts, MD;  Location: [place];  Service: Open Heart Surgery;  Laterality: N/A;  . Tricuspid valve replacement N/A 04/25/2015    Procedure: TRICUSPID VALVE REPAIR;  Surgeon: Rexene Alberts, MD;  Location: [place];  Service: Open Heart Surgery;  Laterality: N/A;  . Maze N/A 04/25/2015    Procedure: MAZE;  Surgeon: Rexene Alberts, MD;  Location: [place];  Service: Open Heart Surgery;  Laterality: N/A;  . Tee without cardioversion N/A 04/25/2015    Procedure: TRANSESOPHAGEAL ECHOCARDIOGRAM (TEE);  Surgeon: Rexene Alberts, MD;  Location: [place];  Service: Open Heart Surgery;  Laterality: N/A;  . Clipping of atrial appendage N/A 04/25/2015    Procedure: CLIPPING OF ATRIAL APPENDAGE;  Surgeon: Rexene Alberts, MD;  Location:  [place] OR;  Service: Open Heart Surgery;  Laterality: N/A;  . Ep implantable device N/A 05/02/2015    Procedure: Pacemaker Implant;  Surgeon: [place] Grayer, MD;  Location: [place] CV LAB;  Service: Cardiovascular;  Laterality: N/A;  . Sternal wound debridement N/A 05/09/2015    Procedure: STERNAL WOUND DEBRIDEMENT;  Surgeon: Rexene Alberts, MD;  Location: [place];  Service: Thoracic;  Laterality: N/A;  . Application of wound vac N/A 05/09/2015    Procedure: APPLICATION OF WOUND VAC;  Surgeon: Rexene Alberts, MD;  [facility];  Service: Thoracic;  Laterality: N/A;  . Coronary artery bypass graft N/A 04/25/2015    Procedure:  CORONARY ARTERY BYPASS GRAFTING (CABG) x ONE, using left internal mammary artery;  Surgeon: Rexene Alberts, MD;  Location: [place];  Service: Open Heart Surgery;  Laterality: N/A;  . Sternal wires removal N/A 06/05/2015    Procedure: STERNAL WIRES REMOVAL;  Surgeon: Rexene Alberts, MD;  [facility];  Service: Thoracic;  Laterality: N/A;    Current Outpatient Prescriptions  Medication Sig Dispense Refill  . acetaminophen (TYLENOL) 325 MG tablet Take 2 tablets (650 mg total) by mouth every 4 (four) hours as needed for mild pain (or Fever >/= 101).    Marland Kitchen amiodarone (PACERONE) 200 MG tablet Take 1 tablet (200 mg total) by mouth daily. 60 tablet 0  . aspirin EC 81 MG EC tablet Take 1 tablet (81 mg total) by mouth daily.    . benzonatate (TESSALON) 100 MG capsule Take 100 mg by mouth 3 (three) times daily as needed for cough (for cold).     . bisoprolol (ZEBETA) 10 MG tablet Take 1 tablet (10 mg total) by mouth daily. 30 tablet 5  . fluticasone (FLONASE) 50 MCG/ACT nasal spray Place 1 spray into both nostrils daily as needed for allergies.     . furosemide (LASIX) 20 MG tablet Take 20 mg by mouth 2 (two) times daily.     . Indacaterol Maleate 75 MCG CAPS Place into inhaler and inhale daily.    Marland Kitchen ipratropium-albuterol (DUONEB) 0.5-2.5 (3) MG/3ML SOLN Take 3 mLs by nebulization every 6 (six) hours as needed (for shortness of breath).     Marland Kitchen levothyroxine (SYNTHROID, LEVOTHROID) 88 MCG tablet Take 88 mcg by mouth daily before breakfast.    . losartan (COZAAR) 50 MG tablet Take 1 tablet (50 mg total) by mouth daily. 90 tablet 3  . pantoprazole (PROTONIX) 40 MG tablet Take 40 mg by mouth 2 (two) times daily.     . potassium chloride SA (K-DUR,KLOR-CON) 20 MEQ tablet Take 1 tablet (20 mEq total) by mouth daily.    . simvastatin (ZOCOR) 20 MG tablet Take 20 mg by mouth daily.    Marland Kitchen spironolactone (ALDACTONE) 25 MG tablet Take 1 tablet (25 mg total) by mouth daily. 30 tablet 6  . Umeclidinium Bromide  (INCRUSE ELLIPTA) 62.5 MCG/INH AEPB Inhale 1 puff into the lungs daily. 1 each 11  . warfarin (COUMADIN) 2.5 MG tablet Take 1 tablet (2.5 mg total) by mouth as directed. 30 tablet 3   No current facility-administered medications for this visit.    Allergies  Allergen Reactions  . Augmentin [Amoxicillin-Pot Clavulanate] Swelling    Swollen joints, pain  . Nifedipine     Other reaction(s): Unknown  . Penicillins     Other reaction(s): Other (See Comments) Swollen joints  . Propranolol     Other reaction(s): Unknown  . Ace Inhibitors  Other reaction(s): Cough  . Diazepam     Other reaction(s): Other (See Comments) Made her "hyper"  . Meperidine     Other reaction(s): Other (See Comments) Made her "hyper"  . Propoxyphene     Other reaction(s): Other (See Comments) Made her "hyper"      Review of Systems negative except from HPI and PMH  Physical Exam BP 116/60 mmHg  Pulse 70  Ht 5\' 2"  (1.575 m)  Wt 135 lb (61.236 kg)  BMI 24.69 kg/m2 Well developed and well nourished in no acute distress HENT normal E scleral and icterus clear Neck Supple JVP flat; carotids brisk and full Clear to ausculation Device pocket well healed; without hematoma or erythema.  There is no tethering Regular rate and rhythm, no murmurs gallops or rub Soft with active bowel sounds No clubbing cyanosis  Edema Alert and oriented, grossly normal motor and sensory function Skin Warm and Dry  ECG demonstrates AV pacing  Assessment and  Plan   atrial fibrillation-paroxysmal status post maze operation  Complete heart block  Sinus node dysfunction-arrest  Status post mitral valve replacement-bioprosthetic tricuspid valve repair  Pacemaker-Medtronic    The patient's device dependent in both atrium and ventricle. It was noted that her rate response function was not programmed on. This is been remedied. Hopefully this will contribute to improve exercise performance. Outputs have also been  reprogrammed.  There has been no interval atrial fibrillation. The plan is to stop her amiodarone. With her bioprosthetic mitral valve she is under the assumption that her anticoagulation will be discontinued and that long-term antiplatelet therapy with aspirin will be sufficient. With her antecedent history of atrial fibrillation in her maze operation and left atrial clipping this may well be the best recommendation. I will defer this to Dr. Fletcher Anon and Roxy Manns

## 2015-08-02 DIAGNOSIS — T888XXA Other specified complications of surgical and medical care, not elsewhere classified, initial encounter: Secondary | ICD-10-CM | POA: Diagnosis not present

## 2015-08-02 DIAGNOSIS — Z952 Presence of prosthetic heart valve: Secondary | ICD-10-CM

## 2015-08-02 NOTE — Progress Notes (Signed)
Daily Session Note  Patient Details  Name: [name] MRN: [id number] Date of Birth: [birth year] Referring Provider:  Wellington [place], MD  Encounter Date: 08/02/2015  Check In:     Session Check In - 08/02/15 0912    Check-In   Staff Present Heath Lark, RN, BSN, CCRP;Calab Sachse, BS, ACSM EP-C, Exercise Physiologist   ER physicians immediately available to respond to emergencies See telemetry face sheet for immediately available ER MD   Medication changes reported     No   Fall or balance concerns reported    No   Warm-up and Cool-down Performed on first and last piece of equipment   VAD Patient? No   Pain Assessment   Currently in Pain? No/denies         Goals Met:  Proper associated with RPD/PD & O2 Sat Exercise tolerated well No report of cardiac concerns or symptoms Strength training completed today  Goals Unmet:  Not Applicable  Goals Comments:    Dr. Emily Filbert is Medical Director for [place] and [facility].

## 2015-08-07 ENCOUNTER — Encounter: Payer: Medicare PPO | Admitting: *Deleted

## 2015-08-07 DIAGNOSIS — Z952 Presence of prosthetic heart valve: Secondary | ICD-10-CM

## 2015-08-07 DIAGNOSIS — Z951 Presence of aortocoronary bypass graft: Secondary | ICD-10-CM

## 2015-08-07 DIAGNOSIS — T888XXA Other specified complications of surgical and medical care, not elsewhere classified, initial encounter: Secondary | ICD-10-CM | POA: Diagnosis not present

## 2015-08-07 NOTE — Progress Notes (Signed)
Daily Session Note  Patient Details  Name: [name] MRN: [id number] Date of Birth: [birth year] Referring Provider:  Wellington Hampshire, MD  Encounter Date: 08/07/2015  Check In:     Session Check In - 08/07/15 0833    Check-In   Staff Present Candiss Norse, MS, ACSM CEP, Exercise Physiologist;Susanne Bice, RN, BSN, Laveda Norman, BS, ACSM CEP, Exercise Physiologist   ER physicians immediately available to respond to emergencies See telemetry face sheet for immediately available ER MD   Medication changes reported     No   Fall or balance concerns reported    No   Warm-up and Cool-down Performed on first and last piece of equipment   VAD Patient? No   Pain Assessment   Currently in Pain? No/denies   Multiple Pain Sites No         Goals Met:  Independence with exercise equipment Exercise tolerated well No report of cardiac concerns or symptoms Strength training completed today  Goals Unmet:  Not Applicable  Goals Comments: Patient completed exercise prescription and all exercise goals during rehab session. The exercise was tolerated well and the patient is progressing in the program.     Dr. Emily Filbert is Medical Director for [facility] and [facility].

## 2015-08-09 DIAGNOSIS — Z952 Presence of prosthetic heart valve: Secondary | ICD-10-CM

## 2015-08-09 DIAGNOSIS — T888XXA Other specified complications of surgical and medical care, not elsewhere classified, initial encounter: Secondary | ICD-10-CM | POA: Diagnosis not present

## 2015-08-09 DIAGNOSIS — Z951 Presence of aortocoronary bypass graft: Secondary | ICD-10-CM

## 2015-08-09 NOTE — Progress Notes (Signed)
Daily Session Note  Patient Details  Name: [name] MRN: [id number] Date of Birth: 1934-11-13 Referring Provider:  Wellington Hampshire, MD  Encounter Date: 08/09/2015  Check In:     Session Check In - 08/09/15 0823    Check-In   Staff Present Lestine Box, BS, ACSM EP-C, Exercise Physiologist;Renee Dillard [place], MS, ACSM CEP, Exercise Physiologist;Susanne Bice, RN, BSN, [place]   ER physicians immediately available to respond to emergencies See telemetry face sheet for immediately available ER MD   Medication changes reported     No   Fall or balance concerns reported    No   Warm-up and Cool-down Performed on first and last piece of equipment   VAD Patient? No   Pain Assessment   Currently in Pain? No/denies         Goals Met:  Proper associated with RPD/PD & O2 Sat Exercise tolerated well No report of cardiac concerns or symptoms Strength training completed today  Goals Unmet:  Not Applicable  Goals Comments:    Dr. Emily Filbert is Medical Director for [facility] and [facility].

## 2015-08-10 ENCOUNTER — Telehealth: Payer: Self-pay | Admitting: Cardiovascular Disease

## 2015-08-10 NOTE — Telephone Encounter (Signed)
New Message    Pt's husband calling stating that the pt has no energy and isn't doing very well, he states the pt "is going downhill fast". Offered Dr. Tyrell Antonio first available appt on 08/25/15 but the husband states they need an appt sooner. Please call back and advise.

## 2015-08-11 ENCOUNTER — Ambulatory Visit: Payer: Medicare PPO

## 2015-08-11 NOTE — Telephone Encounter (Signed)
S/w pt husband who states pt has not had any energy for 3-4 days. Yesterday, they requested to an earlier appt than 12/16 however today, husband reports pt feeling better and does not need earlier appt at this time.  States Dr. Jamal Collin took her off oxygen as pt maintaining 99% at rest on RA and 92-93% w/exertion in his office. She has been progressing and feeling well until a few days ago. No change in medication. Pt did forget to take her morning medications one day this week. She goes to Cardiac Rehab but did not go today.  Husband states he will continue to monitor her and understands to notify the on-call PA this weekend if pt symptoms worsen.  He states if she feels worse this weekend and they are concerned, they will go to ER.  Husband had no further questions.

## 2015-08-13 LAB — CUP PACEART INCLINIC DEVICE CHECK
Implantable Lead Implant Date: 20160823
Implantable Lead Location: 753859
Implantable Lead Location: 753860
MDC IDC LEAD IMPLANT DT: 20160823
MDC IDC SESS DTM: 20161204122605

## 2015-08-14 ENCOUNTER — Telehealth: Payer: Self-pay | Admitting: *Deleted

## 2015-08-14 ENCOUNTER — Encounter: Payer: Medicare PPO | Attending: Internal Medicine

## 2015-08-14 ENCOUNTER — Telehealth: Payer: Self-pay

## 2015-08-14 NOTE — Telephone Encounter (Signed)
S/w pt who reports not feeling well. States she gets exhausted doing simple tasks (making bed, walking across the room). Reports same symptoms when she had issues w/CHF. Hasn't felt like going to cardiac rehab. Missed 11/30, 12/2 and 12/5 appts. States she use to be able to walk 20 minutes on treadmill, now only able to walk 5 min. Denies any other symptoms.  Would like appt w/Dr. Fletcher Anon.   Notified pt spouse of opening 12/8 at 3pm.w/ [place] spouse to have pt send device transmission. Notified [facility] via staff message of pt transmission.  Spouse agreeable w/plan. Confirmed 12/8 appt.

## 2015-08-14 NOTE — Telephone Encounter (Signed)
Called pt spouse back. No answer, no VM. Will call again

## 2015-08-14 NOTE — Progress Notes (Signed)
Cardiac Individual Treatment Plan  Patient Details  Name: [name] MRN: [id number] Date of Birth: [birth year] Referring Provider:  Wellington Hampshire, MD  Initial Encounter Date:    Visit Diagnosis: S/P MVR (mitral valve replacement)  S/P CABG x 1  Patient's Home Medications on Admission:  Current outpatient prescriptions:  .  acetaminophen (TYLENOL) 325 MG tablet, Take 2 tablets (650 mg total) by mouth every 4 (four) hours as needed for mild pain (or Fever >/= 101)., Disp: , Rfl:  .  amiodarone (PACERONE) 200 MG tablet, Take 1 tablet (200 mg total) by mouth daily., Disp: 60 tablet, Rfl: 0 .  aspirin EC 81 MG EC tablet, Take 1 tablet (81 mg total) by mouth daily., Disp: , Rfl:  .  benzonatate (TESSALON) 100 MG capsule, Take 100 mg by mouth 3 (three) times daily as needed for cough (for cold). , Disp: , Rfl:  .  bisoprolol (ZEBETA) 10 MG tablet, Take 1 tablet (10 mg total) by mouth daily., Disp: 30 tablet, Rfl: 5 .  fluticasone (FLONASE) 50 MCG/ACT nasal spray, Place 1 spray into both nostrils daily as needed for allergies. , Disp: , Rfl:  .  furosemide (LASIX) 20 MG tablet, Take 20 mg by mouth 2 (two) times daily. , Disp: , Rfl:  .  Indacaterol Maleate 75 MCG CAPS, Place into inhaler and inhale daily., Disp: , Rfl:  .  ipratropium-albuterol (DUONEB) 0.5-2.5 (3) MG/3ML SOLN, Take 3 mLs by nebulization every 6 (six) hours as needed (for shortness of breath). , Disp: , Rfl:  .  levothyroxine (SYNTHROID, LEVOTHROID) 88 MCG tablet, Take 88 mcg by mouth daily before breakfast., Disp: , Rfl:  .  losartan (COZAAR) 50 MG tablet, Take 1 tablet (50 mg total) by mouth daily., Disp: 90 tablet, Rfl: 3 .  pantoprazole (PROTONIX) 40 MG tablet, Take 40 mg by mouth 2 (two) times daily. , Disp: , Rfl:  .  potassium chloride SA (K-DUR,KLOR-CON) 20 MEQ tablet, Take 1 tablet (20 mEq total) by mouth daily., Disp: , Rfl:  .  simvastatin (ZOCOR) 20 MG tablet, Take 20 mg by mouth daily., Disp: , Rfl:  .   spironolactone (ALDACTONE) 25 MG tablet, Take 1 tablet (25 mg total) by mouth daily., Disp: 30 tablet, Rfl: 6 .  Umeclidinium Bromide (INCRUSE ELLIPTA) 62.5 MCG/INH AEPB, Inhale 1 puff into the lungs daily., Disp: 1 each, Rfl: 11 .  warfarin (COUMADIN) 2.5 MG tablet, Take 1 tablet (2.5 mg total) by mouth as directed., Disp: 30 tablet, Rfl: 3  Past Medical History: Past Medical History  Diagnosis Date  . Hypertension   . COPD (chronic obstructive pulmonary disease) ([place])   . Hyperlipidemia   . Meniere disease   . HX: breast cancer   . Acute CHF (congestive heart failure) ([place])   . Atrial fibrillation ([place])   . Mitral regurgitation   . COPD (chronic obstructive pulmonary disease) ([place])   . Heart murmur     as child  . Hypothyroidism   . Cancer ([place])     hx breast cancer  . Breast cancer ([place]) 08/09/2001    right/rad  . Pulmonary hypertension ([place])   . Tricuspid regurgitation   . Chronic combined systolic and diastolic CHF (congestive heart failure) ([place])   . Coronary artery disease   . PONV (postoperative nausea and vomiting)   . Shortness of breath dyspnea     on exertion  . Anxiety   . GERD (gastroesophageal reflux disease)   . Arthritis   .  s/p mitral valve replacement with bioprosthetic tissue valve 04/25/2015    27 mm [facility] Mitral bovine bioprosthetic tissue valve  . 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 S/P Maze operation for atrial fibrillation 04/25/2015    Complete bilateral atrial lesion set using cryothermy and bipolar radiofrequency ablation with clipping of LA appendage  . S/P CABG x 1 04/25/2015    LIMA to LAD  . Post-surgical complete heart block, symptomatic 05/03/2015  . Presence of permanent cardiac pacemaker     Tobacco Use: History  Smoking status  . Former Smoker -- 57 years  . Types: Cigarettes  . Quit date: 04/20/1998  Smokeless tobacco  . Not on file    Labs: Recent Review Flowsheet Data    Labs  for ITP Cardiac and Pulmonary Rehab Latest Ref Rng 04/26/2015 04/27/2015 04/28/2015 04/29/2015 04/30/2015   TCO2 0 - 100 mmol/L 23 - - 34 34   O2SAT - - 68.5 65.3 61.7 -       Exercise Target Goals:    Exercise Program Goal: Individual exercise prescription set with THRR, safety & activity barriers. Participant demonstrates ability to understand and report RPE using BORG scale, to self-measure pulse accurately, and to acknowledge the importance of the exercise prescription.  Exercise Prescription Goal: Starting with aerobic activity 30 plus minutes a day, 3 days per week for initial exercise prescription. Provide home exercise prescription and guidelines that participant acknowledges understanding prior to discharge.  Activity Barriers & Risk Stratification:     Activity Barriers & Risk Stratification - 07/03/15 1335    Activity Barriers & Risk Stratification   Activity Barriers Balance Concerns;Deconditioning;Joint Problems  Meineres disease= balance concerns, bursitis left hip and leg, sternal open wound   Risk Stratification Moderate      6 Minute Walk:     6 Minute Walk      07/03/15 1424       6 Minute Walk   Phase Initial     Distance 450 feet  NS Test: 450 steps     Walk Time 6 minutes     Resting HR 69 bpm     Resting BP 132/80 mmHg     Max Ex. HR 84 bpm     Max Ex. BP 138/74 mmHg     RPE 11     Symptoms No        Initial Exercise Prescription:     Initial Exercise Prescription - 07/03/15 1400    Date of Initial Exercise Prescription   Date 07/03/15   Bike   Level 0.5   Minutes 15   Recumbant Bike   Level 2   RPM 30   Watts 20   Minutes 15   NuStep   Level 2   Watts 30   Minutes 15   Cybex   Level 1   RPM 30   Minutes 15   Recumbant Elliptical   Level 1   Watts 20   Minutes 15   REL-XR   Level 2   Watts 30   Minutes 15   Prescription Details   Frequency (times per week) 3   Duration Progress to 30 minutes of continuous aerobic without  signs/symptoms of physical distress   Intensity   THRR REST +  30   Progression Continue progressive overload as per policy without signs/symptoms or physical distress.  no upper body exercise due to sternal wound precautions   Resistance Training  Training Prescription No  ROM only due to sternal wound precautions      Exercise Prescription Changes:     Exercise Prescription Changes      07/11/15 0600 07/21/15 0900 07/26/15 0800 08/07/15 1500     Exercise Review   Progression No  Has not started program yet; med review was 07/03/15 No  Has not started program yet; med review was 07/03/15 Yes  Has not started program yet; med review was 07/03/15 Yes    Response to Exercise   Blood Pressure (Admit)    108/62 mmHg    Blood Pressure (Exit)    112/70 mmHg    Heart Rate (Admit)    76 bpm    Heart Rate (Exercise)    101 bpm    Heart Rate (Exit)    71 bpm    Rating of Perceived Exertion (Exercise)    13    Symptoms    None    Comments    Naomy is getting stronger and is especially encouraged on the treadmill. She can now walk for 20 minutes without stopping. She can walk into exercise class without oxygen now too and she was very proud of that.     Duration    Progress to 50 minutes of aerobic without signs/symptoms of physical distress    Intensity    Rest + 30    Progression    Continue progressive overload as per policy without signs/symptoms or physical distress.    Resistance Training   Training Prescription   Yes Yes    Weight   2 2    Reps   10-15 10-15    Treadmill   MPH    2    Grade    0    Minutes    20    REL-XR   Level  3 5 5     Watts  45 45 45    Minutes    15       Discharge Exercise Prescription (Final Exercise Prescription Changes):     Exercise Prescription Changes - 08/07/15 1500    Exercise Review   Progression Yes   Response to Exercise   Blood Pressure (Admit) 108/62 mmHg   Blood Pressure (Exit) 112/70 mmHg   Heart Rate (Admit) 76 bpm   Heart  Rate (Exercise) 101 bpm   Heart Rate (Exit) 71 bpm   Rating of Perceived Exertion (Exercise) 13   Symptoms None   Comments Finlay is getting stronger and is especially encouraged on the treadmill. She can now walk for 20 minutes without stopping. She can walk into exercise class without oxygen now too and she was very proud of that.    Duration Progress to 50 minutes of aerobic without signs/symptoms of physical distress   Intensity Rest + 30   Progression Continue progressive overload as per policy without signs/symptoms or physical distress.   Resistance Training   Training Prescription Yes   Weight 2   Reps 10-15   Treadmill   MPH 2   Grade 0   Minutes 20   REL-XR   Level 5   Watts 45   Minutes 15      Nutrition:  Target Goals: Understanding of nutrition guidelines, daily intake of sodium <1512m, cholesterol <2034m calories 30% from fat and 7% or less from saturated fats, daily to have 5 or more servings of fruits and vegetables.  Biometrics:     Pre Biometrics - 07/03/15 1431    Pre  Biometrics   Height 5' 1"  (1.549 m)   Weight 136 lb 8 oz (61.916 kg)   Waist Circumference 34.5 inches   Hip Circumference 38.5 inches   Waist to Hip Ratio 0.9 %   BMI (Calculated) 25.8       Nutrition Therapy Plan and Nutrition Goals:   Nutrition Discharge: Rate Your Plate Scores:   Nutrition Goals Re-Evaluation:   Psychosocial: Target Goals: Acknowledge presence or absence of depression, maximize coping skills, provide positive support system. Participant is able to verbalize types and ability to use techniques and skills needed for reducing stress and depression.  Initial Review & Psychosocial Screening:     Initial Psych Review & Screening - 07/03/15 1342    Family Dynamics   Good Support System? Yes   Barriers   Psychosocial barriers to participate in program There are no identifiable barriers or psychosocial needs.;The patient should benefit from training in stress  management and relaxation.   Screening Interventions   Interventions Encouraged to exercise      Quality of Life Scores:     Quality of Life - 07/03/15 1446    Quality of Life Scores   Health/Function Pre 22.62 %   Socioeconomic Pre 29.58 %   Psych/Spiritual Pre 30 %   Family Pre 30 %   GLOBAL Pre 26.6 %      PHQ-9:     Recent Review Flowsheet Data    Depression screen [facility] 2/9 07/03/2015   Decreased Interest 0   Down, Depressed, Hopeless 0   PHQ - 2 Score 0   Altered sleeping 0   Tired, decreased energy 3   Change in appetite 0   Feeling bad or failure about yourself  0   Trouble concentrating 0   Moving slowly or fidgety/restless 0   Suicidal thoughts 0   PHQ-9 Score 3   Difficult doing work/chores Somewhat difficult      Psychosocial Evaluation and Intervention:     Psychosocial Evaluation - 07/12/15 0936    Psychosocial Evaluation & Interventions   Interventions Stress management education;Encouraged to exercise with the program and follow exercise prescription;Relaxation education   Comments Counselor met with Ms. Perusse today for initial psychosocial evaluation.  It is her first day in Cardiac Rehab and she was energetic and working hard already!  Ms. Hase is a 80 year old who had open heart surgery in August followed by subsequent procedures and an infection resulting in 5 weeks in the hospital.  She has a strong support system with a spouse of 35 years and several siblings who live closeby.  Ms. Gossen reports that she sleeps well and typically has a good appetite.  She denies a history  of depression or anxiety or any current symptoms.  She states her mood is generally positive and her doctor has released her to return to "normal activities" with pacing herself.  Ms. Deem has goals in this program to increase her strength and stamina and to improve her balance - as she occasionally has to use a walker.  She will benefit from the consistent exercise aspect of  this program.     Continued Psychosocial Services Needed No      Psychosocial Re-Evaluation:     Psychosocial Re-Evaluation      07/24/15 0940           Psychosocial Re-Evaluation   Comments Counselor follow up with Ms. Tippens today reporting she has better balance now and is no longer using her walker at  home at all.  She brings it to this program just to carry her oxygen tank.  She states she is experiencing more stamina as well and able to walk and move more and for longer periods of time.  She continues to sleep well, eat well and have a positive attitude.  Counselor commended Ms. Othman for all her hard work and progress made so far.            Vocational Rehabilitation: Provide vocational rehab assistance to qualifying candidates.   Vocational Rehab Evaluation & Intervention:     Vocational Rehab - 07/03/15 1337    Initial Vocational Rehab Evaluation & Intervention   Assessment shows need for Vocational Rehabilitation No      Education: Education Goals: Education classes will be provided on a weekly basis, covering required topics. Participant will state understanding/return demonstration of topics presented.  Learning Barriers/Preferences:     Learning Barriers/Preferences - 07/03/15 1336    Learning Barriers/Preferences   Learning Barriers Hearing   Learning Preferences Video      Education Topics: General Nutrition Guidelines/Fats and Fiber: -Group instruction provided by verbal, written material, models and posters to present the general guidelines for heart healthy nutrition. Gives an explanation and review of dietary fats and fiber.   Controlling Sodium/Reading Food Labels: -Group verbal and written material supporting the discussion of sodium use in heart healthy nutrition. Review and explanation with models, verbal and written materials for utilization of the food label.   Exercise Physiology & Risk Factors: - Group verbal and written instruction  with models to review the exercise physiology of the cardiovascular system and associated critical values. Details cardiovascular disease risk factors and the goals associated with each risk factor.          Cardiac Rehab from 08/09/2015 in [facility] Cardiac Rehab   Date  07/12/15   Educator  RM   Instruction Review Code  2- meets goals/outcomes      Aerobic Exercise & Resistance Training: - Gives group verbal and written discussion on the health impact of inactivity. On the components of aerobic and resistive training programs and the benefits of this training and how to safely progress through these programs.      Cardiac Rehab from 08/09/2015 in [facility] Cardiac Rehab   Date  07/17/15   Educator  RM   Instruction Review Code  2- meets goals/outcomes      Flexibility, Balance, General Exercise Guidelines: - Provides group verbal and written instruction on the benefits of flexibility and balance training programs. Provides general exercise guidelines with specific guidelines to those with heart or lung disease. Demonstration and skill practice provided.      Cardiac Rehab from 08/09/2015 in [facility] Cardiac Rehab   Date  07/24/15   Educator  RM   Instruction Review Code  2- meets goals/outcomes      Stress Management: - Provides group verbal and written instruction about the health risks of elevated stress, cause of high stress, and healthy ways to reduce stress.      Cardiac Rehab from 08/09/2015 in [facility] Cardiac Rehab   Date  07/26/15   Educator  [facility]   Instruction Review Code  2- meets goals/outcomes      Depression: - Provides group verbal and written instruction on the correlation between heart/lung disease and depressed mood, treatment options, and the stigmas associated with seeking treatment.   Anatomy & Physiology of the Heart: - Group verbal and written instruction and models provide basic cardiac anatomy  and physiology, with the coronary electrical and arterial systems. Review  of: AMI, Angina, Valve disease, Heart Failure, Cardiac Arrhythmia, Pacemakers, and the ICD.   Cardiac Procedures: - Group verbal and written instruction and models to describe the testing methods done to diagnose heart disease. Reviews the outcomes of the test results. Describes the treatment choices: Medical Management, Angioplasty, or Coronary Bypass Surgery.   Cardiac Medications: - Group verbal and written instruction to review commonly prescribed medications for heart disease. Reviews the medication, class of the drug, and side effects. Includes the steps to properly store meds and maintain the prescription regimen.   Go Sex-Intimacy & Heart Disease, Get SMART - Goal Setting: - Group verbal and written instruction through game format to discuss heart disease and the return to sexual intimacy. Provides group verbal and written material to discuss and apply goal setting through the application of the S.M.A.R.T. Method.   Other Matters of the Heart: - Provides group verbal, written materials and models to describe Heart Failure, Angina, Valve Disease, and Diabetes in the realm of heart disease. Includes description of the disease process and treatment options available to the cardiac patient.      Cardiac Rehab from 08/09/2015 in [facility] Cardiac Rehab   Date  08/09/15   Educator  SB   Instruction Review Code  2- meets goals/outcomes      Exercise & Equipment Safety: - Individual verbal instruction and demonstration of equipment use and safety with use of the equipment.      Cardiac Rehab from 08/09/2015 in [facility] Cardiac Rehab   Date  07/03/15   Educator  SB   Instruction Review Code  2- meets goals/outcomes      Infection Prevention: - Provides verbal and written material to individual with discussion of infection control including proper hand washing and proper equipment cleaning during exercise session.      Cardiac Rehab from 08/09/2015 in [facility] Cardiac Rehab   Date  07/03/15    Educator  Sb   Instruction Review Code  2- meets goals/outcomes      Falls Prevention: - Provides verbal and written material to individual with discussion of falls prevention and safety.      Cardiac Rehab from 08/09/2015 in [facility] Cardiac Rehab   Date  07/03/15   Educator  Sb   Instruction Review Code  2- meets goals/outcomes      Diabetes: - Individual verbal and written instruction to review signs/symptoms of diabetes, desired ranges of glucose level fasting, after meals and with exercise. Advice that pre and post exercise glucose checks will be done for 3 sessions at entry of program.    Knowledge Questionnaire Score:     Knowledge Questionnaire Score - 07/03/15 1440    Knowledge Questionnaire Score   Pre Score 26/28      Personal Goals and Risk Factors at Admission:     Personal Goals and Risk Factors at Admission - 07/03/15 1339    Personal Goals and Risk Factors on Admission   Increase Aerobic Exercise and Physical Activity Yes;Sedentary  Balance concerns ,using walker for ambulation.   Intervention While in program, learn and follow the exercise prescription taught. Start at a low level workload and increase workload after able to maintain previous level for 30 minutes. Increase time before increasing intensity.   Intervention Provide exercise education and an individualized exercise prescription that will provide continued progressive overload as per policy without signs/symptoms of physical distress.   Diabetes No   Hypertension Yes  Goal Participant will see blood pressure controlled within the values of 140/63m/Hg or within value directed by their physician.   Intervention Provide nutrition & aerobic exercise along with prescribed medications to achieve BP 140/90 or less.   Lipids Yes   Goal Cholesterol controlled with medications as prescribed, with individualized exercise RX and with personalized nutrition plan. Value goals: LDL < 756m HDL > 4053mParticipant  states understanding of desired cholesterol values and following prescriptions.   Intervention Provide nutrition & aerobic exercise along with prescribed medications to achieve LDL <45m48mDL >40mg4m   Personal Goals and Risk Factors Review:      Goals and Risk Factor Review      07/17/15 1007 08/02/15 1511         Increase Aerobic Exercise and Physical Activity   Goals Progress/Improvement seen   Yes      Comments Patient discussed her desire to get back to "normal" and was talking about some of the exercise she used to do before he heart episode. She said that after her first week of class she was a little sore. We discussed that that can be a normal response of the body when a new activity is preformed, and how over time the soreness could go away once he muscles get stronger and used to the new activity. Education on cross training/ the body's response to new activities was given.  Shantika Zelinaed today she can bend down and tie her shoes.  Today she was having a flare of her bursitis in her leg. No treadmill time because of the bursitis pain.  She is walking more at home and her husband bought her a cane to help her stay balanced. We talked about the distance it is to walk from the hosptal entrance to the rehab gym. Her husband wheels her down. Her goal is to be able to walk from the class to the front door without wheelchair assistance by Dec 19th.       Hypertension   Progress seen toward goals  Yes      Comments  Katrena's BP remains in normal range. She will see RD in Dec on the 19th to review nutrition plan.       Abnormal Lipids   Progress seen towards goals  Unknown      Comments  No labs to review. Gaelle Odetta have an appointment with the RD in December.          Personal Goals Discharge (Final Personal Goals and Risk Factors Review):      Goals and Risk Factor Review - 08/02/15 1511    Increase Aerobic Exercise and Physical Activity   Goals Progress/Improvement seen  Yes    Comments Yamilett Omeliaed today she can bend down and tie her shoes.  Today she was having a flare of her bursitis in her leg. No treadmill time because of the bursitis pain.  She is walking more at home and her husband bought her a cane to help her stay balanced. We talked about the distance it is to walk from the hosptal entrance to the rehab gym. Her husband wheels her down. Her goal is to be able to walk from the class to the front door without wheelchair assistance by Dec 19th.    Hypertension   Progress seen toward goals Yes   Comments Adore's BP remains in normal range. She will see RD in Dec on the 19th to review nutrition plan.    Abnormal Lipids  Progress seen towards goals Unknown   Comments No labs to review. Siria will have an appointment with the RD in December.       ITP Comments:     ITP Comments      08/14/15 1211           ITP Comments 30 day review preparation: Continue with ITP.          Comments:

## 2015-08-14 NOTE — Telephone Encounter (Signed)
Forward to scheduling.

## 2015-08-14 NOTE — Telephone Encounter (Signed)
Pt husband calling stating Can't walk across room without "giving out" States she is not getting better she was fine for a little while then started back to not doing well.  Would like to know what can be done Please call

## 2015-08-14 NOTE — Telephone Encounter (Signed)
Who would you like for patient to see? Dr Caryl Comes or Dr Fletcher Anon?

## 2015-08-15 NOTE — Telephone Encounter (Signed)
Reviewed manual transmission with Dr. Caryl Comes.  PPM function WNL, heart rate histograms improved since rate response turned on at 08/01/15 appointment.  No additional PPM reprogramming necessary/advisible at this point in time.

## 2015-08-16 ENCOUNTER — Telehealth: Payer: Self-pay | Admitting: *Deleted

## 2015-08-16 NOTE — Addendum Note (Signed)
Addended by: Lynford Humphrey on: 08/16/2015 10:42 AM   Modules accepted: Orders

## 2015-08-16 NOTE — Telephone Encounter (Signed)
Naor called to say she will be out rest of the week.  Not feeling well and is contacting her doctor.

## 2015-08-16 NOTE — Addendum Note (Signed)
Addended by: Lynford Humphrey on: 08/16/2015 10:44 AM   Modules accepted: Orders

## 2015-08-17 ENCOUNTER — Other Ambulatory Visit
Admission: RE | Admit: 2015-08-17 | Discharge: 2015-08-17 | Disposition: A | Discharge status: Home / Self Care | Payer: Medicare PPO | Source: Ambulatory Visit | Attending: Nurse Practitioner | Admitting: Nurse Practitioner

## 2015-08-17 ENCOUNTER — Ambulatory Visit
Admission: RE | Admit: 2015-08-17 | Discharge: 2015-08-17 | Disposition: A | Discharge status: Home / Self Care | Payer: Medicare PPO | Source: Ambulatory Visit | Attending: Nurse Practitioner | Admitting: Nurse Practitioner

## 2015-08-17 ENCOUNTER — Ambulatory Visit (INDEPENDENT_AMBULATORY_CARE_PROVIDER_SITE_OTHER): Payer: Medicare PPO | Admitting: Nurse Practitioner

## 2015-08-17 ENCOUNTER — Encounter: Payer: Self-pay | Admitting: Nurse Practitioner

## 2015-08-17 VITALS — BP 140/72 | HR 71 | Ht 62.0 in | Wt 135.5 lb

## 2015-08-17 DIAGNOSIS — I10 Essential (primary) hypertension: Secondary | ICD-10-CM | POA: Diagnosis not present

## 2015-08-17 DIAGNOSIS — R05 Cough: Secondary | ICD-10-CM | POA: Diagnosis not present

## 2015-08-17 DIAGNOSIS — I5033 Acute on chronic diastolic (congestive) heart failure: Secondary | ICD-10-CM

## 2015-08-17 DIAGNOSIS — R059 Cough, unspecified: Secondary | ICD-10-CM

## 2015-08-17 DIAGNOSIS — I480 Paroxysmal atrial fibrillation: Secondary | ICD-10-CM

## 2015-08-17 DIAGNOSIS — Z952 Presence of prosthetic heart valve: Secondary | ICD-10-CM | POA: Diagnosis not present

## 2015-08-17 DIAGNOSIS — Z9889 Other specified postprocedural states: Secondary | ICD-10-CM | POA: Insufficient documentation

## 2015-08-17 DIAGNOSIS — I4891 Unspecified atrial fibrillation: Secondary | ICD-10-CM | POA: Diagnosis not present

## 2015-08-17 DIAGNOSIS — Z950 Presence of cardiac pacemaker: Secondary | ICD-10-CM | POA: Insufficient documentation

## 2015-08-17 DIAGNOSIS — I5032 Chronic diastolic (congestive) heart failure: Secondary | ICD-10-CM | POA: Insufficient documentation

## 2015-08-17 LAB — BASIC METABOLIC PANEL
Anion gap: 9 (ref 5–15)
BUN: 26 mg/dL — AB (ref 6–20)
CHLORIDE: 104 mmol/L (ref 101–111)
CO2: 27 mmol/L (ref 22–32)
CREATININE: 1.1 mg/dL — AB (ref 0.44–1.00)
Calcium: 9.2 mg/dL (ref 8.9–10.3)
GFR calc Af Amer: 54 mL/min — ABNORMAL LOW (ref >60)
GFR calc non Af Amer: 47 mL/min — ABNORMAL LOW (ref >60)
Glucose, Bld: 166 mg/dL — ABNORMAL HIGH (ref 65–99)
POTASSIUM: 4.5 mmol/L (ref 3.5–5.1)
SODIUM: 140 mmol/L (ref 135–145)

## 2015-08-17 NOTE — Progress Notes (Signed)
Patient Name: [name] Date of Encounter: 08/17/2015  Primary Care Provider:  Ezequiel Kayser, MD Primary Cardiologist:  Jerilynn Mages. Fletcher Anon, MD / S. Caryl Comes, MD   Chief Complaint  80 y/o female with a h/o PAF, MR/TR s/p MVR/TVR/Maze and LAA clipping, who presents secondary to a one week h/o progressive DOE.  Past Medical History   Past Medical History  Diagnosis Date  . Hypertension   . COPD (chronic obstructive pulmonary disease) ([place])   . Hyperlipidemia   . Meniere disease   . HX: breast cancer   . PAF (paroxysmal atrial fibrillation) ([place])   . COPD (chronic obstructive pulmonary disease) ([place])   . Hypothyroidism   . Breast cancer ([place]) 08/09/2001    right/rad  . Pulmonary hypertension ([place])   . Chronic diastolic CHF (congestive heart failure) ([place])   . PONV (postoperative nausea and vomiting)   . Anxiety   . GERD (gastroesophageal reflux disease)   . Arthritis   . Severe Mitral Regurgitation s/p MV Repair     a. 04/2015 s/p 27 mm [facility] Mitral bovine bioprosthetic tissue valve  . Tricuspid Regurgitation s/p Repair     a. 04/2015 s/p 26 mm Edwards mc3 ring annuloplasty  . 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 04/2015 Complete bilateral atrial lesion set using cryothermy and bipolar radiofrequency ablation with clipping of LA appendage  . CAD s/p CABG x 1     a. 04/2015 LIMA to LAD  . Post-surgical complete heart block, symptomatic     a. 04/2015 s/p MDT [id number] Advisa DR MRI DC PPM.   Past Surgical History  Procedure Laterality Date  . Cochlear implant Bilateral   . Mastectomy, partial Right   . Cardiac catheterization  11/2013    [facility]  . Cardiac catheterization  10/2014    [facility]  . Breast biopsy Left 11/25/06    neg  . Breast biopsy Left 01/20/12    lt bx /clip-neg  . Eye surgery Bilateral 2014    cataract surgery  . Mitral valve repair N/A 04/25/2015    Procedure: MITRAL VALVE  REPLACEMENT using a 27 mm Edwards Perimount Magna Mitral Ease Valve;  Surgeon:  Rexene Alberts, MD;  Location: [place];  Service: Open Heart Surgery;  Laterality: N/A;  . Tricuspid valve replacement N/A 04/25/2015    Procedure: TRICUSPID VALVE REPAIR;  Surgeon: Rexene Alberts, MD;  Location: [place];  Service: Open Heart Surgery;  Laterality: N/A;  . Maze N/A 04/25/2015    Procedure: MAZE;  Surgeon: Rexene Alberts, MD;  Location: [place];  Service: Open Heart Surgery;  Laterality: N/A;  . Tee without cardioversion N/A 04/25/2015    Procedure: TRANSESOPHAGEAL ECHOCARDIOGRAM (TEE);  Surgeon: Rexene Alberts, MD;  Location: [place];  Service: Open Heart Surgery;  Laterality: N/A;  . Clipping of atrial appendage N/A 04/25/2015    Procedure: CLIPPING OF ATRIAL APPENDAGE;  Surgeon: Rexene Alberts, MD;  Location: [place];  Service: Open Heart Surgery;  Laterality: N/A;  . Ep implantable device N/A 05/02/2015    Procedure: Pacemaker Implant;  Surgeon: Thompson Grayer, MD;  Location: [place] [facility];  Service: Cardiovascular;  Laterality: N/A;  . Sternal wound debridement N/A 05/09/2015    Procedure: STERNAL WOUND DEBRIDEMENT;  Surgeon: Rexene Alberts, MD;  Location: [place];  Service: Thoracic;  Laterality: N/A;  . Application of wound vac N/A 05/09/2015    Procedure: APPLICATION OF WOUND VAC;  Surgeon: Braulio Conte  Keturah Barre, MD;  Location: [place] [facility];  Service: Thoracic;  Laterality: N/A;  . Coronary artery bypass graft N/A 04/25/2015    Procedure: CORONARY ARTERY BYPASS GRAFTING (CABG) x ONE, using left internal mammary artery;  Surgeon: Rexene Alberts, MD;  Location: [place];  Service: Open Heart Surgery;  Laterality: N/A;  . Sternal wires removal N/A 06/05/2015    Procedure: STERNAL WIRES REMOVAL;  Surgeon: Rexene Alberts, MD;  Location: [facility];  Service: Thoracic;  Laterality: N/A;    Allergies  Allergies  Allergen Reactions  . Augmentin [Amoxicillin-Pot Clavulanate] Swelling    Swollen joints, pain  . Nifedipine     Other reaction(s): Unknown  . Penicillins     Other reaction(s): Other (See  Comments) Swollen joints  . Propranolol     Other reaction(s): Unknown  . Ace Inhibitors     Other reaction(s): Cough  . Diazepam     Other reaction(s): Other (See Comments) Made her "hyper"  . Meperidine     Other reaction(s): Other (See Comments) Made her "hyper"  . Propoxyphene     Other reaction(s): Other (See Comments) Made her "hyper"    HPI  80 y/o female with the above complex PMH.  In Feb 2016, she was dx with PAF and CHF.  EF initially 35-40%.  Cath revealed moderate prox LAD dzs while TEE revealed mod TR, PAH, and mod-sev MR.  She underwent DCCV @ that time.  F/u echo showed persistent sev MR and she was subsequently referred to [facility] and underwent MVR/TVR/Maze/LAA clipping and CABG x 1 (LIMA  LAD).  Post-op course complicated by sternal wound infxn and CHB req MDT PPM placement.  She was subsequently d/c'd on amio and coumadin in setting of PAF.  She was seen twice in clinic in Nov and was doing well.  Over the past 2 mos, she has noted a dry, non-productive cough. This is similar to what she experienced when she was on lisinopril in the past.  She has not had fevers or chills and overall was doing well.  Over the past week however, she has been experiencing progressive DOE along with mild lower ext edema, some increase in abd girth, and 3 lbs wt gain.  She admits to frequently getting take-out (drive-thru windows/fast food) but has been compliant with meds.  She has not had any c/p, pnd, orthopnea, n, v, dizziness, syncope, or early satiety.  B/c of dyspnea, her husband called the office and an appt was arranged for today.  Home Medications  Prior to Admission medications   Medication Sig Start Date End Date Taking? Authorizing Provider  acetaminophen (TYLENOL) 325 MG tablet Take 2 tablets (650 mg total) by mouth every 4 (four) hours as needed for mild pain (or Fever >/= 101). 05/11/15  Yes Erin R Barrett, PA-C  amiodarone (PACERONE) 200 MG tablet Take 1 tablet (200 mg total) by  mouth daily. 06/12/15  Yes Rexene Alberts, MD  aspirin EC 81 MG EC tablet Take 1 tablet (81 mg total) by mouth daily. 05/11/15  Yes Erin R Barrett, PA-C  benzonatate (TESSALON) 100 MG capsule Take 100 mg by mouth 3 (three) times daily as needed for cough (for cold).    Yes Historical Provider, MD  bisoprolol (ZEBETA) 10 MG tablet Take 1 tablet (10 mg total) by mouth daily. 06/01/15  Yes Wellington Hampshire, MD  fluticasone (FLONASE) 50 MCG/ACT nasal spray Place 1 spray into both nostrils daily as needed for allergies.  Yes Historical Provider, MD  furosemide (LASIX) 20 MG tablet Take 20 mg by mouth 2 (two) times daily.    Yes Historical Provider, MD  Indacaterol Maleate 75 MCG CAPS Place into inhaler and inhale daily.   Yes Historical Provider, MD  ipratropium-albuterol (DUONEB) 0.5-2.5 (3) MG/3ML SOLN Take 3 mLs by nebulization every 6 (six) hours as needed (for shortness of breath).    Yes Historical Provider, MD  levothyroxine (SYNTHROID, LEVOTHROID) 88 MCG tablet Take 88 mcg by mouth daily before breakfast.   Yes Historical Provider, MD  losartan (COZAAR) 50 MG tablet Take 1 tablet (50 mg total) by mouth daily. 06/01/15  Yes Wellington Hampshire, MD  pantoprazole (PROTONIX) 40 MG tablet Take 40 mg by mouth 2 (two) times daily.    Yes Historical Provider, MD  potassium chloride SA (K-DUR,KLOR-CON) 20 MEQ tablet Take 1 tablet (20 mEq total) by mouth daily. 05/11/15  Yes Erin R Barrett, PA-C  simvastatin (ZOCOR) 20 MG tablet Take 20 mg by mouth daily.   Yes Historical Provider, MD  spironolactone (ALDACTONE) 25 MG tablet Take 1 tablet (25 mg total) by mouth daily. 12/12/14  Yes Wellington Hampshire, MD  Umeclidinium Bromide (INCRUSE ELLIPTA) 62.5 MCG/INH AEPB Inhale 1 puff into the lungs daily. 07/27/15  Yes Wilhelmina Mcardle, MD  warfarin (COUMADIN) 2.5 MG tablet Take 1 tablet (2.5 mg total) by mouth as directed. 06/30/15  Yes Wellington Hampshire, MD    Review of Systems  DOE with LEE, 3 lbs wt gain, and increased  abd girth over the past week.  She has a 2 mos h/o non-productive cough.  All other systems reviewed and are otherwise negative except as noted above.  Physical Exam  VS:  BP 140/72 mmHg  Pulse 71  Ht 5\' 2"  (1.575 m)  Wt 135 lb 8 oz (61.462 kg)  BMI 24.78 kg/m2 , BMI Body mass index is 24.78 kg/(m^2). GEN: Well nourished, well developed, in no acute distress. HEENT: normal. Neck: Supple, mod elevated JVP (~ 12 cm).  No carotid bruits, or masses. Cardiac: RRR, no murmurs, rubs, or gallops. No clubbing, cyanosis, 1+ bilat LE edema to mid-calf.  Radials/DP/PT 2+ and equal bilaterally.  Respiratory:  Respirations regular and unlabored, faint basilar crackles, otw clear to auscultation bilaterally. GI: Soft, nontender, nondistended, BS + x 4. MS: no deformity or atrophy. Skin: warm and dry, no rash. Neuro:  Strength and sensation are intact. Psych: Normal affect.  Accessory Clinical Findings  ECG - AV paced  Assessment & Plan  1.  Acute on chronic diastolic CHF:  Pt presents with a 1 week h/o progressive DOE, 3 lbs wt gain, inc abd girth, and mild LEE.  She denies c/p.  She has been compliant with meds but also has fairly significant dietary indiscretion - eating take out multiple times/week.  Her neck veins are elevated on exam and she does have crackles and edema.  I will check a bmet and cxr today and have asked her to increase her lasix to 40 BID x 3 days and then drop back down to 20 mg BID.  She will also increase her KCl to 40 meq daily while taking the extra lasix.  We discussed the importance of daily weights, sodium restriction, medication compliance, and symptom reporting and she verbalizes understanding.  I will arrange for 2D echo to re-eval LV and valvular fxn.  BP slightly elevated - should improve with diuresis.  Cont bb, ARB, and spiro.  2.  PAF:  In sinus (AV paced).  Recent device interrogation shows nl fxn w/o recurrent AF.  She remains on amio, bb, and coumadin.  Drs.  Klein/Arida/Owen to determine long-term anticoagulation plans in setting of Maze and LAA clipping.  3.  Essential HTN:  Mod elevated bp today.  Cont current meds, noting higher dose of lasix for next three days.  4.  HL:  Cont statin.  5.  Valvular Heart Dzs: s/p MVR/TVR.  F/U echo in setting of #1.  No murmurs on exam.  6.  CAD:  S/p LIMA LAD @ time of MVR.  No c/p.  No significant CAD on cath earlier this year.  Cont bb, asa, statin.  7.  Cough:  Dry, non-productive cough over the past 2 mos.  Similar to what she experienced on ACEI in past.  On ARB currently.  ? Role of mild volume overload, though cough preceded most recent Ss.  Will check CXR.  She has f/u with pulmonology later this month.  8.  Dispo:  F/u bmet and cxr.  F/U in 2 wks or sooner if necessary.   Murray Hodgkins, NP 08/17/2015, 4:40 PM

## 2015-08-17 NOTE — Patient Instructions (Addendum)
Medication Instructions:  Your physician has recommended you make the following change in your medication:  INCREASE your lasix to 40mg  twice per day for three days then resume 20mg  twice per day. INCREASE your potassium to 20mg  twice per day for three days then resume 20mg  once per day   Labwork: BMET  Testing/Procedures: A chest x-ray takes a picture of the organs and structures inside the chest, including the heart, lungs, and blood vessels. This test can show several things, including, whether the heart is enlarges; whether fluid is building up in the lungs; and whether pacemaker / defibrillator leads are still in place.  Your physician has requested that you have an echocardiogram. Echocardiography is a painless test that uses sound waves to create images of your heart. It provides your doctor with information about the size and shape of your heart and how well your heart's chambers and valves are working. This procedure takes approximately one hour. There are no restrictions for this procedure.    Follow-Up: Your physician recommends that you schedule a follow-up appointment in: 2 weeks with Dr. Fletcher Anon.    Any Other Special Instructions Will Be Listed Below (If Applicable).     If you need a refill on your cardiac medications before your next appointment, please call your pharmacy.  Echocardiogram An echocardiogram, or echocardiography, uses sound waves (ultrasound) to produce an image of your heart. The echocardiogram is simple, painless, obtained within a short period of time, and offers valuable information to your health care provider. The images from an echocardiogram can provide information such as:  Evidence of coronary artery disease (CAD).  Heart size.  Heart muscle function.  Heart valve function.  Aneurysm detection.  Evidence of a past heart attack.  Fluid buildup around the heart.  Heart muscle thickening.  Assess heart valve function. LET [facility]  CARE PROVIDER KNOW ABOUT:  Any allergies you have.  All medicines you are taking, including vitamins, herbs, eye drops, creams, and over-the-counter medicines.  Previous problems you or members of your family have had with the use of anesthetics.  Any blood disorders you have.  Previous surgeries you have had.  Medical conditions you have.  Possibility of pregnancy, if this applies. BEFORE THE PROCEDURE  No special preparation is needed. Eat and drink normally.  PROCEDURE   In order to produce an image of your heart, gel will be applied to your chest and a wand-like tool (transducer) will be moved over your chest. The gel will help transmit the sound waves from the transducer. The sound waves will harmlessly bounce off your heart to allow the heart images to be captured in real-time motion. These images will then be recorded.  You may need an IV to receive a medicine that improves the quality of the pictures. AFTER THE PROCEDURE You may return to your normal schedule including diet, activities, and medicines, unless your health care provider tells you otherwise.   This information is not intended to replace advice given to you by your health care provider. Make sure you discuss any questions you have with your health care provider.   Document Released: 08/23/2000 Document Revised: 09/16/2014 Document Reviewed: 05/03/2013 Elsevier Interactive Patient Education 2016 Elsevier Inc. Low-Sodium Eating Plan Sodium raises blood pressure and causes water to be held in the body. Getting less sodium from food will help lower your blood pressure, reduce any swelling, and protect your heart, liver, and kidneys. We get sodium by adding salt (sodium chloride) to food. Most of  our sodium comes from canned, boxed, and frozen foods. Restaurant foods, fast foods, and pizza are also very high in sodium. Even if you take medicine to lower your blood pressure or to reduce fluid in your body, getting less  sodium from your food is important. WHAT IS MY PLAN? Most people should limit their sodium intake to 2,300 mg a day. Your health care provider recommends that you limit your sodium intake WHAT DO I NEED TO KNOW ABOUT THIS EATING PLAN? For the low-sodium eating plan, you will follow these general guidelines: Choose foods with a % Daily Value for sodium of less than 5% (as listed on the food label).  Use salt-free seasonings or herbs instead of table salt or sea salt.  Check with your health care provider or pharmacist before using salt substitutes.  Eat fresh foods. Eat more vegetables and fruits. Limit canned vegetables. If you do use them, rinse them well to decrease the sodium.  Limit cheese to 1 oz (28 g) per day.  Eat lower-sodium products, often labeled as "lower sodium" or "no salt added." Avoid foods that contain monosodium glutamate (MSG). MSG is sometimes added to [place] food and some canned foods. Check food labels (Nutrition Facts labels) on foods to learn how much sodium is in one serving. Eat more home-cooked food and less restaurant, buffet, and fast food. When eating at a restaurant, ask that your food be prepared with less salt, or no salt if possible.  HOW DO I READ FOOD LABELS FOR SODIUM INFORMATION? The Nutrition Facts label lists the amount of sodium in one serving of the food. If you eat more than one serving, you must multiply the listed amount of sodium by the number of servings. Food labels may also identify foods as: Sodium free--Less than 5 mg in a serving. Very low sodium--35 mg or less in a serving. Low sodium--140 mg or less in a serving. Light in sodium--50% less sodium in a serving. For example, if a food that usually has 300 mg of sodium is changed to become light in sodium, it will have 150 mg of sodium. Reduced sodium--25% less sodium in a serving. For example, if a food that usually has 400 mg of sodium is changed to reduced sodium, it will have  300 mg of sodium. WHAT FOODS CAN I EAT? Grains Low-sodium cereals, including oats, puffed wheat and rice, and shredded wheat cereals. Low-sodium crackers. Unsalted rice and pasta. Lower-sodium bread.  Vegetables Frozen or fresh vegetables. Low-sodium or reduced-sodium canned vegetables. Low-sodium or reduced-sodium tomato sauce and paste. Low-sodium or reduced-sodium tomato and vegetable juices.  Fruits Fresh, frozen, and canned fruit. Fruit juice.  Meat and Other Protein Products Low-sodium canned tuna and salmon. Fresh or frozen meat, poultry, seafood, and fish. Lamb. Unsalted nuts. Dried beans, peas, and lentils without added salt. Unsalted canned beans. Homemade soups without salt. Eggs.  Dairy Milk. Soy milk. Ricotta cheese. Low-sodium or reduced-sodium cheeses. Yogurt.  Condiments Fresh and dried herbs and spices. Salt-free seasonings. Onion and garlic powders. Low-sodium varieties of mustard and ketchup. Fresh or refrigerated horseradish. Lemon juice.  Fats and Oils Reduced-sodium salad dressings. Unsalted butter.  Other Unsalted popcorn and pretzels.  The items listed above may not be a complete list of recommended foods or beverages. Contact your dietitian for more options. WHAT FOODS ARE NOT RECOMMENDED? Grains Instant hot cereals. Bread stuffing, pancake, and biscuit mixes. Croutons. Seasoned rice or pasta mixes. Noodle soup cups. Boxed or frozen macaroni and cheese. Self-rising flour.  Regular salted crackers. Vegetables Regular canned vegetables. Regular canned tomato sauce and paste. Regular tomato and vegetable juices. Frozen vegetables in sauces. Salted [place] fries. Olives. Angie Fava. Relishes. Sauerkraut. Salsa. Meat and Other Protein Products Salted, canned, smoked, spiced, or pickled meats, seafood, or fish. Bacon, ham, sausage, hot dogs, corned beef, chipped beef, and packaged luncheon meats. Salt pork. Jerky. Pickled herring. Anchovies, regular canned tuna,  and sardines. Salted nuts. Dairy Processed cheese and cheese spreads. Cheese curds. Blue cheese and cottage cheese. Buttermilk.  Condiments Onion and garlic salt, seasoned salt, table salt, and sea salt. Canned and packaged gravies. Worcestershire sauce. Tartar sauce. Barbecue sauce. Teriyaki sauce. Soy sauce, including reduced sodium. Steak sauce. Fish sauce. Oyster sauce. Cocktail sauce. Horseradish that you find on the shelf. Regular ketchup and mustard. Meat flavorings and tenderizers. Bouillon cubes. Hot sauce. Tabasco sauce. Marinades. Taco seasonings. Relishes. Fats and Oils Regular salad dressings. Salted butter. Margarine. Ghee. Bacon fat.  Other Potato and tortilla chips. Corn chips and puffs. Salted popcorn and pretzels. Canned or dried soups. Pizza. Frozen entrees and pot pies.  The items listed above may not be a complete list of foods and beverages to avoid. Contact your dietitian for more information.   This information is not intended to replace advice given to you by your health care provider. Make sure you discuss any questions you have with your health care provider.   Document Released: 02/15/2002 Document Revised: 09/16/2014 Document Reviewed: 06/30/2013 Elsevier Interactive Patient Education [employer].

## 2015-08-18 ENCOUNTER — Ambulatory Visit: Payer: Medicare PPO

## 2015-08-22 ENCOUNTER — Other Ambulatory Visit: Payer: Self-pay

## 2015-08-22 ENCOUNTER — Ambulatory Visit (INDEPENDENT_AMBULATORY_CARE_PROVIDER_SITE_OTHER): Payer: Medicare PPO

## 2015-08-22 DIAGNOSIS — I5033 Acute on chronic diastolic (congestive) heart failure: Secondary | ICD-10-CM | POA: Diagnosis not present

## 2015-08-23 ENCOUNTER — Ambulatory Visit (INDEPENDENT_AMBULATORY_CARE_PROVIDER_SITE_OTHER): Payer: Medicare PPO | Admitting: Pulmonary Disease

## 2015-08-23 ENCOUNTER — Ambulatory Visit (INDEPENDENT_AMBULATORY_CARE_PROVIDER_SITE_OTHER): Payer: Medicare PPO

## 2015-08-23 ENCOUNTER — Telehealth: Payer: Self-pay

## 2015-08-23 ENCOUNTER — Encounter: Payer: Self-pay | Admitting: Pulmonary Disease

## 2015-08-23 VITALS — BP 130/68 | HR 93 | Ht 62.0 in | Wt 135.0 lb

## 2015-08-23 DIAGNOSIS — R05 Cough: Secondary | ICD-10-CM

## 2015-08-23 DIAGNOSIS — I272 Other secondary pulmonary hypertension: Secondary | ICD-10-CM

## 2015-08-23 DIAGNOSIS — I340 Nonrheumatic mitral (valve) insufficiency: Secondary | ICD-10-CM | POA: Diagnosis not present

## 2015-08-23 DIAGNOSIS — J440 Chronic obstructive pulmonary disease with acute lower respiratory infection: Secondary | ICD-10-CM | POA: Diagnosis not present

## 2015-08-23 DIAGNOSIS — Z953 Presence of xenogenic heart valve: Secondary | ICD-10-CM

## 2015-08-23 DIAGNOSIS — R059 Cough, unspecified: Secondary | ICD-10-CM

## 2015-08-23 DIAGNOSIS — J441 Chronic obstructive pulmonary disease with (acute) exacerbation: Secondary | ICD-10-CM | POA: Diagnosis not present

## 2015-08-23 DIAGNOSIS — I4891 Unspecified atrial fibrillation: Secondary | ICD-10-CM

## 2015-08-23 LAB — POCT INR: INR: 2.1

## 2015-08-23 MED ORDER — DOXYCYCLINE HYCLATE 100 MG PO TABS: 100.0000 mg | ORAL_TABLET | Freq: Two times a day (BID) | ORAL | Status: DC

## 2015-08-23 MED ORDER — IPRATROPIUM-ALBUTEROL 0.5-2.5 (3) MG/3ML IN SOLN: 3.0000 mL | Freq: Four times a day (QID) | RESPIRATORY_TRACT | Status: DC | PRN

## 2015-08-23 NOTE — Telephone Encounter (Signed)
[name] called and stated she was going to have to stay out of class a while due to some problems.

## 2015-08-24 NOTE — Telephone Encounter (Signed)
[name] will not be in today

## 2015-08-24 NOTE — Progress Notes (Signed)
PULMONARY OFFICE FOLLOW UP NOTE  Date of initial consultation: 07/27/15 Reason for consultation: COPD  Initial HPI:  60 F here to establish care for COPD. Previously followed by Dr Raul Del. She was first diagnosed with COPD approx 4 yrs ago at the time of a pneumonia (was not hospitalized). She has been treated with bronchodilator therapy. She has been on oxygen therapy since 12/15. Notably, at that time, she was suffering from CHF. She has since undergone Maze MVR and single vessel CABG. Her post op course was complicated by a wound infection. She has now recovered fully from that and is participating in cardiac rehab program. She is presently limited by fatigue and deconditioning more than by dyspnea. She walks slowly on a treadmill for 20 mins @ cardiac rehab. She can complete a flight of stairs. She has minimal cough and very infrequent sputum production.  She has been tried on [facility] which caused hoarseness. She was changed to a LABA which was not discernibly beneficial. She almost never uses her rescue inhaler. She also has a nebulizer @ home with Duoneb but she has not used this in months. She denies fever, hemoptysis, LE edema. She is up to date on flu and pneumococcal vaccines.   Initial Impression (07/26/25): Chronic obstructive pulmonary disease, unspecified COPD type Hypoxemia - resolved. Might have been due to CHF  Initial Data (07/27/15): 05/08/15 CT chest:  IMPRESSION: 1. Expected postoperative changes associated with recent CABG procedure. 2. No subcutaneous fluid collections identified overlying the sternotomy defect. The components of the sternotomy defect appear well approximated and there is no evidence for sternal wire fracture. 3. A small low-attenuation fluid collection is identified within the anterior mediastinum/retrosternal space. This is nonspecific finding in the early postoperative time frame. 4. Small pleural effusions. 5. Prior granulomatous disease 05/22/15 CXR:  Hyperinflation. NAD 03/17/15 PFTs: mild-mod obstruction, normal lung volumes, severe decrease DLCO  Initial Plan (07/27/15): Trial of Incruse - once inhalation daily Change O2 to nocturnal and PRN during day  Subsequent data: CXR 08/17/15: No acute chest findings. Chronic postoperative changes as described. TTE 08/22/15: LVEF 30-35%. Diffuse HK. prosthetic valves functioning well   SUBJ: Believes Incruse has helped "a little". Reports recent increased cough and hoarseness. She is wearing O2 compliantly @ night and occasionally during day. Denies fever, hemoptysis and LE edema. Cough is productive of scant mucus. She is using Duoneb only rarely   Filed Vitals:   08/23/15 0955  BP: 130/68  Pulse: 93  Height: 5\' 2"  (1.575 m)  Weight: 135 lb (61.236 kg)  SpO2: 94%     EXAM:  Gen: WDWN in NAD HEENT: All WNL Neck: NO LAN, no JVD noted Lungs: slightly diminshed BS, normal percussion note throughout, no wheezes, rales Cardiovascular: Reg, no M noted Abdomen: Soft, NT +BS Ext: no C/C/E Neuro: CNs intact, motor/sens grossly intact Skin: No lesions noted  BMP Latest Ref Rng 08/17/2015 06/05/2015 05/10/2015  Glucose 65 - 99 mg/dL 166(H) 118(H) 123(H)  BUN 6 - 20 mg/dL 26(H) 16 8  Creatinine 0.44 - 1.00 mg/dL 1.10(H) 0.85 0.73  BUN/Creat Ratio 11 - 26 - - -  Sodium 135 - 145 mmol/L 140 139 138  Potassium 3.5 - 5.1 mmol/L 4.5 4.1 4.1  Chloride 101 - 111 mmol/L 104 102 94(L)  CO2 22 - 32 mmol/L 27 30 37(H)  Calcium 8.9 - 10.3 mg/dL 9.2 9.1 8.9    CBC Latest Ref Rng 06/05/2015 05/11/2015 05/10/2015  WBC 4.0 - 10.5 K/uL 11.7(H) 12.1(H) 14.3(H)  Hemoglobin 12.0 - 15.0 g/dL 8.9(L) 8.4(L) 8.3(L)  Hematocrit 36.0 - 46.0 % 29.2(L) 27.3(L) 26.9(L)  Platelets 150 - 400 K/uL 294 360 354    CXR 12/08: as above. I have personally reviewed  IMPRESSION: COPD, chronic bronchitis Pulmonary hypertension-  Likely Group 2 - PH due to left heart disease Mild AECOPD without overt  bronchospasm  PLAN: Cont Incruse Cont PRN Duoneb Cont nocturnal and PRN diurnal O2 Doxycycline X 7 days ROV 3 months She is to call if current symptoms do not resolve with abx as prescribed   Merton Border, MD PCCM service Mobile [phone] Pager [phone]

## 2015-08-25 ENCOUNTER — Ambulatory Visit: Payer: Medicare PPO

## 2015-08-31 ENCOUNTER — Encounter: Payer: Self-pay | Admitting: Cardiovascular Disease

## 2015-08-31 ENCOUNTER — Ambulatory Visit (INDEPENDENT_AMBULATORY_CARE_PROVIDER_SITE_OTHER): Payer: Medicare PPO | Admitting: Cardiovascular Disease

## 2015-08-31 VITALS — BP 130/72 | HR 92 | Ht 62.0 in | Wt 134.0 lb

## 2015-08-31 DIAGNOSIS — I5022 Chronic systolic (congestive) heart failure: Secondary | ICD-10-CM | POA: Diagnosis not present

## 2015-08-31 DIAGNOSIS — I10 Essential (primary) hypertension: Secondary | ICD-10-CM

## 2015-08-31 DIAGNOSIS — I2510 Atherosclerotic heart disease of native coronary artery without angina pectoris: Secondary | ICD-10-CM

## 2015-08-31 DIAGNOSIS — Z950 Presence of cardiac pacemaker: Secondary | ICD-10-CM | POA: Diagnosis not present

## 2015-08-31 DIAGNOSIS — I4891 Unspecified atrial fibrillation: Secondary | ICD-10-CM

## 2015-08-31 MED ORDER — LOSARTAN POTASSIUM 100 MG PO TABS: 100.0000 mg | ORAL_TABLET | Freq: Every day | ORAL | Status: DC

## 2015-08-31 NOTE — Assessment & Plan Note (Signed)
No evidence of recurrent atrial fibrillation since her surgery. I discontinued amiodarone today. Given the drop in ejection fraction, I am going to keep her on warfarin for now.

## 2015-08-31 NOTE — Patient Instructions (Addendum)
Medication Instructions:  Your physician has recommended you make the following change in your medication:  STOP taking amiodarone INCREASE losartan to 100mg  daily   Labwork: BMET in one week  Testing/Procedures: none  Follow-Up: Your physician recommends that you schedule a follow-up appointment in: 3 months with Dr. Fletcher Anon Your physician recommends that you schedule a follow-up appointment with Dr. Caryl Comes.  Any Other Special Instructions Will Be Listed Below (If Applicable).     If you need a refill on your cardiac medications before your next appointment, please call your pharmacy.

## 2015-08-31 NOTE — Progress Notes (Signed)
Primary care physician: Dr. Ezequiel Kayser  HPI  This is a pleasant 80 year old female who is here today for a follow-up visit regarding chronic systolic heart failure, atrial fibrillation and mitral regurgitation.  She has known history of COPD, breast cancer status post right partial mastectomy, hypothyroidism, hyperlipidemia and Mnire disease.   She underwent mitral valve replacement with a bioprosthetic valve, tricuspid valve repair, one-vessel CABG with LIMA to LAD and maze procedure in August 2016. This was complicated by sternal wound infection which required debridement. She also had complete heart block and underwent permanent pacemaker placement. She progressed normally post surgery and started cardiac rehabilitation. However, she had recent worsening of exertional dyspnea and dry cough. She was seen by Ignacia Bayley and was noted to be in heart failure. The dose of Lasix was increased with some improvement in her symptoms. She had a repeat echocardiogram which showed worsening of LV systolic function with an EF of 30-35%. Ejection fraction before surgery was 50%. The mitral valve appeared intact with no significant regurgitation and minimal gradient. There was also resolution of pulmonary hypertension. She feels slightly better but she continues to complain of fatigue and having no energy.    Allergies  Allergen Reactions  . Augmentin [Amoxicillin-Pot Clavulanate] Swelling    Swollen joints, pain  . Nifedipine     Other reaction(s): Unknown  . Penicillins     Other reaction(s): Other (See Comments) Swollen joints  . Propranolol     Other reaction(s): Unknown  . Ace Inhibitors     Other reaction(s): Cough  . Diazepam     Other reaction(s): Other (See Comments) Made her "hyper"  . Meperidine     Other reaction(s): Other (See Comments) Made her "hyper"  . Propoxyphene     Other reaction(s): Other (See Comments) Made her "hyper"     Current Outpatient Prescriptions on File  Prior to Visit  Medication Sig Dispense Refill  . acetaminophen (TYLENOL) 325 MG tablet Take 2 tablets (650 mg total) by mouth every 4 (four) hours as needed for mild pain (or Fever >/= 101).    Marland Kitchen aspirin EC 81 MG EC tablet Take 1 tablet (81 mg total) by mouth daily.    . benzonatate (TESSALON) 100 MG capsule Take 100 mg by mouth 3 (three) times daily as needed for cough (for cold).     . bisoprolol (ZEBETA) 10 MG tablet Take 1 tablet (10 mg total) by mouth daily. 30 tablet 5  . fluticasone (FLONASE) 50 MCG/ACT nasal spray Place 1 spray into both nostrils daily as needed for allergies.     . furosemide (LASIX) 20 MG tablet Take 20 mg by mouth 2 (two) times daily.     . Indacaterol Maleate 75 MCG CAPS Place into inhaler and inhale daily.    Marland Kitchen ipratropium-albuterol (DUONEB) 0.5-2.5 (3) MG/3ML SOLN Take 3 mLs by nebulization every 6 (six) hours as needed (for shortness of breath). 360 mL 10  . levothyroxine (SYNTHROID, LEVOTHROID) 88 MCG tablet Take 88 mcg by mouth daily before breakfast.    . pantoprazole (PROTONIX) 40 MG tablet Take 40 mg by mouth 2 (two) times daily.     . potassium chloride SA (K-DUR,KLOR-CON) 20 MEQ tablet Take 1 tablet (20 mEq total) by mouth daily.    . simvastatin (ZOCOR) 20 MG tablet Take 20 mg by mouth daily.    Marland Kitchen spironolactone (ALDACTONE) 25 MG tablet Take 1 tablet (25 mg total) by mouth daily. 30 tablet 6  . Umeclidinium Bromide (INCRUSE  ELLIPTA) 62.5 MCG/INH AEPB Inhale 1 puff into the lungs daily. 1 each 11  . warfarin (COUMADIN) 2.5 MG tablet Take 1 tablet (2.5 mg total) by mouth as directed. 30 tablet 3   No current facility-administered medications on file prior to visit.     Past Medical History  Diagnosis Date  . Hypertension   . COPD (chronic obstructive pulmonary disease) ([place])   . Hyperlipidemia   . Meniere disease   . HX: breast cancer   . PAF (paroxysmal atrial fibrillation) ([place])   . COPD (chronic obstructive pulmonary disease) ([place])   .  Hypothyroidism   . Breast cancer ([place]) 08/09/2001    right/rad  . Pulmonary hypertension ([place])   . Chronic diastolic CHF (congestive heart failure) ([place])   . PONV (postoperative nausea and vomiting)   . Anxiety   . GERD (gastroesophageal reflux disease)   . Arthritis   . Severe Mitral Regurgitation s/p MV Repair     a. 04/2015 s/p 27 mm [facility] Mitral bovine bioprosthetic tissue valve  . Tricuspid Regurgitation s/p Repair     a. 04/2015 s/p 26 mm Edwards mc3 ring annuloplasty  . 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 04/2015 Complete bilateral atrial lesion set using cryothermy and bipolar radiofrequency ablation with clipping of LA appendage  . CAD s/p CABG x 1     a. 04/2015 LIMA to LAD  . Post-surgical complete heart block, symptomatic     a. 04/2015 s/p MDT [id number] Advisa DR MRI DC PPM.     Past Surgical History  Procedure Laterality Date  . Cochlear implant Bilateral   . Mastectomy, partial Right   . Cardiac catheterization  11/2013    [facility]  . Cardiac catheterization  10/2014    [facility]  . Breast biopsy Left 11/25/06    neg  . Breast biopsy Left 01/20/12    lt bx /clip-neg  . Eye surgery Bilateral 2014    cataract surgery  . Mitral valve repair N/A 04/25/2015    Procedure: MITRAL VALVE  REPLACEMENT using a 27 mm Edwards Perimount Magna Mitral Ease Valve;  Surgeon: Rexene Alberts, MD;  Location: [place];  Service: Open Heart Surgery;  Laterality: N/A;  . Tricuspid valve replacement N/A 04/25/2015    Procedure: TRICUSPID VALVE REPAIR;  Surgeon: Rexene Alberts, MD;  Location: [place];  Service: Open Heart Surgery;  Laterality: N/A;  . Maze N/A 04/25/2015    Procedure: MAZE;  Surgeon: Rexene Alberts, MD;  Location: [place];  Service: Open Heart Surgery;  Laterality: N/A;  . Tee without cardioversion N/A 04/25/2015    Procedure: TRANSESOPHAGEAL ECHOCARDIOGRAM (TEE);  Surgeon: Rexene Alberts, MD;  Location: [place];  Service: Open Heart Surgery;  Laterality: N/A;  . Clipping of  atrial appendage N/A 04/25/2015    Procedure: CLIPPING OF ATRIAL APPENDAGE;  Surgeon: Rexene Alberts, MD;  Location: [place];  Service: Open Heart Surgery;  Laterality: N/A;  . Ep implantable device N/A 05/02/2015    Procedure: Pacemaker Implant;  Surgeon: [place] Grayer, MD;  Location: [place];  Service: Cardiovascular;  Laterality: N/A;  . Sternal wound debridement N/A 05/09/2015    Procedure: STERNAL WOUND DEBRIDEMENT;  Surgeon: Rexene Alberts, MD;  Location: [place];  Service: Thoracic;  Laterality: N/A;  . Application of wound vac N/A 05/09/2015    Procedure: APPLICATION OF WOUND VAC;  Surgeon: Rexene Alberts, MD;  Location: [place];  Service: Thoracic;  Laterality: N/A;  . Coronary artery bypass graft N/A 04/25/2015    Procedure: CORONARY ARTERY BYPASS GRAFTING (CABG) x ONE, using left internal mammary artery;  Surgeon: Rexene Alberts, MD;  Location: [place];  Service: Open Heart Surgery;  Laterality: N/A;  . Sternal wires removal N/A 06/05/2015    Procedure: STERNAL WIRES REMOVAL;  Surgeon: Rexene Alberts, MD;  Location: [facility];  Service: Thoracic;  Laterality: N/A;     Family History  Problem Relation Age of Onset  . Heart disease Father   . Heart disease Brother   . Heart attack Paternal Uncle      Social History   Social History  . Marital Status: Married    Spouse Name: N/A  . Number of Children: N/A  . Years of Education: N/A   Occupational History  . Not on file.   Social History Main Topics  . Smoking status: Former Smoker -- 40 years    Types: Cigarettes    Quit date: 04/20/1998  . Smokeless tobacco: Not on file  . Alcohol Use: No  . Drug Use: No  . Sexual Activity: Not on file   Other Topics Concern  . Not on file   Social History Narrative      PHYSICAL EXAM   BP 130/72 mmHg  Pulse 92  Ht 5\' 2"  (1.575 m)  Wt 134 lb (60.782 kg)  BMI 24.50 kg/m2  SpO2 94% Constitutional: She is oriented to person, place, and time. She appears well-developed  and well-nourished. No distress.  HENT: No nasal discharge.  Head: Normocephalic and atraumatic.  Eyes: Pupils are equal and round. No discharge.  Neck: Normal range of motion. Neck supple. Mild JVD present. No thyromegaly present.  Cardiovascular: Normal rate, regular rhythm, normal heart sounds. Exam reveals no gallop and no friction rub. No murmurs. Pulmonary/Chest: Effort normal and breath sounds normal. No stridor. No respiratory distress. She has no wheezes. She has no rales. She exhibits no tenderness.  Abdominal: Soft. Bowel sounds are normal. She exhibits no distension. There is no tenderness. There is no rebound and no guarding.  Musculoskeletal: Normal range of motion. She exhibits no edema and no tenderness.  Neurological: She is alert and oriented to person, place, and time. Coordination normal.  Skin: Skin is warm and dry. No rash noted. She is not diaphoretic. No erythema. No pallor.  Psychiatric: She has a normal mood and affect. Her behavior is normal. Judgment and thought content normal.     EKG: AV sequential pacing at a rate of 92 bpm  ASSESSMENT AND PLAN

## 2015-08-31 NOTE — Assessment & Plan Note (Signed)
The patient had worsening symptoms of heart failure with a drop in ejection fraction from 50% to 30-35%. Mitral and tricuspid valves appeared intact and that does not seem to be the culprit. I think there are 2 possibilities. The first is worsening EF post mitral valve repair for regurgitation that occasionally can happen as a result of increased LV afterload. The second possibility is RV pacing leading to dyssynchrony. She appears to be pacer dependent at the present time. I am going to  increase the dose of losartan to 100 mg once daily. Continue current dose of furosemide. Check basic metabolic profile in one week. I'm sending her back to see Dr. Caryl Comes to consider upgrading her pacemaker to a biventricular pacemaker given the drop in ejection fraction.

## 2015-08-31 NOTE — Assessment & Plan Note (Signed)
Her symptoms include shortness of breath without chest pain.

## 2015-09-01 ENCOUNTER — Ambulatory Visit: Payer: Medicare PPO

## 2015-09-06 ENCOUNTER — Other Ambulatory Visit
Admission: RE | Admit: 2015-09-06 | Discharge: 2015-09-06 | Disposition: A | Discharge status: Home / Self Care | Payer: Medicare PPO | Source: Ambulatory Visit | Attending: Cardiovascular Disease | Admitting: Cardiovascular Disease

## 2015-09-06 ENCOUNTER — Encounter: Payer: Self-pay | Admitting: *Deleted

## 2015-09-06 ENCOUNTER — Telehealth: Payer: Self-pay | Admitting: *Deleted

## 2015-09-06 ENCOUNTER — Other Ambulatory Visit: Payer: Medicare PPO

## 2015-09-06 DIAGNOSIS — I4891 Unspecified atrial fibrillation: Secondary | ICD-10-CM | POA: Diagnosis not present

## 2015-09-06 DIAGNOSIS — I712 Thoracic aortic aneurysm, without rupture, unspecified: Secondary | ICD-10-CM

## 2015-09-06 DIAGNOSIS — I10 Essential (primary) hypertension: Secondary | ICD-10-CM

## 2015-09-06 DIAGNOSIS — I5022 Chronic systolic (congestive) heart failure: Secondary | ICD-10-CM | POA: Diagnosis not present

## 2015-09-06 LAB — BASIC METABOLIC PANEL
Anion gap: 7 (ref 5–15)
BUN: 20 mg/dL (ref 6–20)
CHLORIDE: 101 mmol/L (ref 101–111)
CO2: 28 mmol/L (ref 22–32)
Calcium: 9.2 mg/dL (ref 8.9–10.3)
Creatinine, Ser: 0.91 mg/dL (ref 0.44–1.00)
GFR calc Af Amer: >60 mL/min (ref >60)
GFR calc non Af Amer: 59 mL/min — ABNORMAL LOW (ref >60)
GLUCOSE: 291 mg/dL — AB (ref 65–99)
POTASSIUM: 4.5 mmol/L (ref 3.5–5.1)
Sodium: 136 mmol/L (ref 135–145)

## 2015-09-06 NOTE — Telephone Encounter (Signed)
I called [name] and she reports she can't come back until she has a pacemaker placed Oct 09, 2015/ [name] reports she used to walk on the treadmill with no problems for 20 minutes but lately only able to walk 2 minutes due to shortness of breath.

## 2015-09-07 ENCOUNTER — Telehealth: Payer: Self-pay | Admitting: *Deleted

## 2015-09-07 NOTE — Telephone Encounter (Signed)
Called to speak with [name]  She is having heart problems.  Not pumping like it is supposed to and has appointment with Dr. Caryl Comes Sep 30, 2015. To see if adjusting her pacemaker will help the problem.   She will remain out of program until seen and concerns are resolved.

## 2015-09-08 ENCOUNTER — Ambulatory Visit: Payer: Medicare PPO

## 2015-09-10 NOTE — Progress Notes (Signed)
Cardiac Individual Treatment Plan  Patient Details  Name: [name] MRN: [id number] Date of Birth: Sep 18, 1934 Referring Provider:  Wellington Hampshire, MD  Initial Encounter Date:    Visit Diagnosis: S/P MVR (mitral valve replacement) - Plan: CARDIAC REHAB 30 DAY REVIEW, CARDIAC REHAB 30 DAY REVIEW  S/P CABG x 1 - Plan: CARDIAC REHAB 30 DAY REVIEW, CARDIAC REHAB 30 DAY REVIEW  Patient's Home Medications on Admission:  Current outpatient prescriptions:  .  acetaminophen (TYLENOL) 325 MG tablet, Take 2 tablets (650 mg total) by mouth every 4 (four) hours as needed for mild pain (or Fever >/= 101)., Disp: , Rfl:  .  aspirin EC 81 MG EC tablet, Take 1 tablet (81 mg total) by mouth daily., Disp: , Rfl:  .  benzonatate (TESSALON) 100 MG capsule, Take 100 mg by mouth 3 (three) times daily as needed for cough (for cold). , Disp: , Rfl:  .  bisoprolol (ZEBETA) 10 MG tablet, Take 1 tablet (10 mg total) by mouth daily., Disp: 30 tablet, Rfl: 5 .  fluticasone (FLONASE) 50 MCG/ACT nasal spray, Place 1 spray into both nostrils daily as needed for allergies. , Disp: , Rfl:  .  furosemide (LASIX) 20 MG tablet, Take 20 mg by mouth 2 (two) times daily. , Disp: , Rfl:  .  Indacaterol Maleate 75 MCG CAPS, Place into inhaler and inhale daily., Disp: , Rfl:  .  ipratropium-albuterol (DUONEB) 0.5-2.5 (3) MG/3ML SOLN, Take 3 mLs by nebulization every 6 (six) hours as needed (for shortness of breath)., Disp: 360 mL, Rfl: 10 .  levothyroxine (SYNTHROID, LEVOTHROID) 88 MCG tablet, Take 88 mcg by mouth daily before breakfast., Disp: , Rfl:  .  losartan (COZAAR) 100 MG tablet, Take 1 tablet (100 mg total) by mouth daily., Disp: 30 tablet, Rfl: 3 .  pantoprazole (PROTONIX) 40 MG tablet, Take 40 mg by mouth 2 (two) times daily. , Disp: , Rfl:  .  potassium chloride SA (K-DUR,KLOR-CON) 20 MEQ tablet, Take 1 tablet (20 mEq total) by mouth daily., Disp: , Rfl:  .  simvastatin (ZOCOR) 20 MG tablet, Take 20 mg by mouth  daily., Disp: , Rfl:  .  spironolactone (ALDACTONE) 25 MG tablet, Take 1 tablet (25 mg total) by mouth daily., Disp: 30 tablet, Rfl: 6 .  Umeclidinium Bromide (INCRUSE ELLIPTA) 62.5 MCG/INH AEPB, Inhale 1 puff into the lungs daily., Disp: 1 each, Rfl: 11 .  warfarin (COUMADIN) 2.5 MG tablet, Take 1 tablet (2.5 mg total) by mouth as directed., Disp: 30 tablet, Rfl: 3  Past Medical History: Past Medical History  Diagnosis Date  . Hypertension   . COPD (chronic obstructive pulmonary disease) ([place])   . Hyperlipidemia   . Meniere disease   . HX: breast cancer   . PAF (paroxysmal atrial fibrillation) ([place])   . COPD (chronic obstructive pulmonary disease) ([place])   . Hypothyroidism   . Breast cancer ([place]) 08/09/2001    right/rad  . Pulmonary hypertension ([place])   . Chronic diastolic CHF (congestive heart failure) ([place])   . PONV (postoperative nausea and vomiting)   . Anxiety   . GERD (gastroesophageal reflux disease)   . Arthritis   . Severe Mitral Regurgitation s/p MV Repair     a. 04/2015 s/p 27 mm [facility] Mitral bovine bioprosthetic tissue valve  . Tricuspid Regurgitation s/p Repair     a. 04/2015 s/p 26 mm Edwards mc3 ring annuloplasty  . Maze operation for AF w/ LAA clipping  a. 04/2015 Complete bilateral atrial lesion set using cryothermy and bipolar radiofrequency ablation with clipping of LA appendage  . CAD s/p CABG x 1     a. 04/2015 LIMA to LAD  . Post-surgical complete heart block, symptomatic     a. 04/2015 s/p MDT [id number] Advisa DR MRI DC PPM.    Tobacco Use: History  Smoking status  . Former Smoker -- 65 years  . Types: Cigarettes  . Quit date: 04/20/1998  Smokeless tobacco  . Not on file    Labs: Recent Review Flowsheet Data    Labs for ITP Cardiac and Pulmonary Rehab Latest Ref Rng 04/26/2015 04/27/2015 04/28/2015 04/29/2015 04/30/2015   TCO2 0 - 100 mmol/L 23 - - 34 34   O2SAT - - 68.5 65.3 61.7 -       Exercise Target Goals:    Exercise Program  Goal: Individual exercise prescription set with THRR, safety & activity barriers. Participant demonstrates ability to understand and report RPE using BORG scale, to self-measure pulse accurately, and to acknowledge the importance of the exercise prescription.  Exercise Prescription Goal: Starting with aerobic activity 30 plus minutes a day, 3 days per week for initial exercise prescription. Provide home exercise prescription and guidelines that participant acknowledges understanding prior to discharge.  Activity Barriers & Risk Stratification:     Activity Barriers & Risk Stratification - 07/03/15 1335    Activity Barriers & Risk Stratification   Activity Barriers Balance Concerns;Deconditioning;Joint Problems  Meineres disease= balance concerns, bursitis left hip and leg, sternal open wound   Risk Stratification Moderate      6 Minute Walk:     6 Minute Walk      07/03/15 1424       6 Minute Walk   Phase Initial     Distance 450 feet  NS Test: 450 steps     Walk Time 6 minutes     Resting HR 69 bpm     Resting BP 132/80 mmHg     Max Ex. HR 84 bpm     Max Ex. BP 138/74 mmHg     RPE 11     Symptoms No        Initial Exercise Prescription:     Initial Exercise Prescription - 07/03/15 1400    Date of Initial Exercise Prescription   Date 07/03/15   Bike   Level 0.5   Minutes 15   Recumbant Bike   Level 2   RPM 30   Watts 20   Minutes 15   NuStep   Level 2   Watts 30   Minutes 15   Cybex   Level 1   RPM 30   Minutes 15   Recumbant Elliptical   Level 1   Watts 20   Minutes 15   REL-XR   Level 2   Watts 30   Minutes 15   Prescription Details   Frequency (times per week) 3   Duration Progress to 30 minutes of continuous aerobic without signs/symptoms of physical distress   Intensity   THRR REST +  30   Progression Continue progressive overload as per policy without signs/symptoms or physical distress.  no upper body exercise due to sternal wound  precautions   Resistance Training   Training Prescription No  ROM only due to sternal wound precautions      Exercise Prescription Changes:     Exercise Prescription Changes      07/11/15 0600 07/21/15 0900 07/26/15 0800 08/07/15  1500 08/09/15 0750   Exercise Review   Progression No  Has not started program yet; med review was 07/03/15 No  Has not started program yet; med review was 07/03/15 Yes  Has not started program yet; med review was 07/03/15 Yes No   Response to Exercise   Blood Pressure (Admit)    108/62 mmHg 116/64 mmHg   Blood Pressure (Exercise)     118/64 mmHg   Blood Pressure (Exit)    112/70 mmHg 108/56 mmHg   Heart Rate (Admit)    76 bpm 76 bpm   Heart Rate (Exercise)    101 bpm 105 bpm   Heart Rate (Exit)    71 bpm 73 bpm   Rating of Perceived Exertion (Exercise)    13 13   Symptoms    None None   Comments    Oluwakemi is getting stronger and is especially encouraged on the treadmill. She can now walk for 20 minutes without stopping. She can walk into exercise class without oxygen now too and she was very proud of that.  Prisila has been out for almost a month and will continue to be out due to illness and medical reasons. No more exercise progression will be made and we will meet with her individually to adjust workloads based on her exercise capacity when she returns    Duration    Progress to 50 minutes of aerobic without signs/symptoms of physical distress Progress to 50 minutes of aerobic without signs/symptoms of physical distress   Intensity    Rest + 30 Rest + 30   Progression    Continue progressive overload as per policy without signs/symptoms or physical distress. Continue progressive overload as per policy without signs/symptoms or physical distress.   Resistance Training   Training Prescription   Yes Yes Yes   Weight   2 2 2    Reps   10-15 10-15 10-15   Treadmill   MPH    2 2   Grade    0 0   Minutes    20 20   REL-XR   Level  3 5 5 5    Watts  45 45 45 45    Minutes    15 15      Discharge Exercise Prescription (Final Exercise Prescription Changes):     Exercise Prescription Changes - 08/09/15 0750    Exercise Review   Progression No   Response to Exercise   Blood Pressure (Admit) 116/64 mmHg   Blood Pressure (Exercise) 118/64 mmHg   Blood Pressure (Exit) 108/56 mmHg   Heart Rate (Admit) 76 bpm   Heart Rate (Exercise) 105 bpm   Heart Rate (Exit) 73 bpm   Rating of Perceived Exertion (Exercise) 13   Symptoms None   Comments Satia has been out for almost a month and will continue to be out due to illness and medical reasons. No more exercise progression will be made and we will meet with her individually to adjust workloads based on her exercise capacity when she returns    Duration Progress to 50 minutes of aerobic without signs/symptoms of physical distress   Intensity Rest + 30   Progression Continue progressive overload as per policy without signs/symptoms or physical distress.   Resistance Training   Training Prescription Yes   Weight 2   Reps 10-15   Treadmill   MPH 2   Grade 0   Minutes 20   REL-XR   Level 5   Watts 45  Minutes 15      Nutrition:  Target Goals: Understanding of nutrition guidelines, daily intake of sodium <1569m, cholesterol <2031m calories 30% from fat and 7% or less from saturated fats, daily to have 5 or more servings of fruits and vegetables.  Biometrics:     Pre Biometrics - 07/03/15 1431    Pre Biometrics   Height 5' 1"  (1.549 m)   Weight 136 lb 8 oz (61.916 kg)   Waist Circumference 34.5 inches   Hip Circumference 38.5 inches   Waist to Hip Ratio 0.9 %   BMI (Calculated) 25.8       Nutrition Therapy Plan and Nutrition Goals:   Nutrition Discharge: Rate Your Plate Scores:   Nutrition Goals Re-Evaluation:   Psychosocial: Target Goals: Acknowledge presence or absence of depression, maximize coping skills, provide positive support system. Participant is able to verbalize  types and ability to use techniques and skills needed for reducing stress and depression.  Initial Review & Psychosocial Screening:     Initial Psych Review & Screening - 07/03/15 1342    Family Dynamics   Good Support System? Yes   Barriers   Psychosocial barriers to participate in program There are no identifiable barriers or psychosocial needs.;The patient should benefit from training in stress management and relaxation.   Screening Interventions   Interventions Encouraged to exercise      Quality of Life Scores:     Quality of Life - 07/03/15 1446    Quality of Life Scores   Health/Function Pre 22.62 %   Socioeconomic Pre 29.58 %   Psych/Spiritual Pre 30 %   Family Pre 30 %   GLOBAL Pre 26.6 %      PHQ-9:     Recent Review Flowsheet Data    [facility]   Decreased Interest 0   Down, Depressed, Hopeless 0   PHQ - 2 Score 0   Altered sleeping 0   Tired, decreased energy 3   Change in appetite 0   Feeling bad or failure about yourself  0   Trouble concentrating 0   Moving slowly or fidgety/restless 0   Suicidal thoughts 0   PHQ-9 Score 3   Difficult doing work/chores Somewhat difficult      Psychosocial Evaluation and Intervention:     Psychosocial Evaluation - 07/12/15 0936    Psychosocial Evaluation & Interventions   Interventions Stress management education;Encouraged to exercise with the program and follow exercise prescription;Relaxation education   Comments Counselor met with Ms. Arel today for initial psychosocial evaluation.  It is her first day in Cardiac Rehab and she was energetic and working hard already!  Ms. MuSladeks a [id number] old who had open heart surgery in August followed by subsequent procedures and an infection resulting in 5 weeks in the hospital.  She has a strong support system with a spouse of [phone] and several siblings who live closeby.  Ms. MuAronoffeports that she sleeps well and typically has a good  appetite.  She denies a history  of depression or anxiety or any current symptoms.  She states her mood is generally positive and her doctor has released her to return to "normal activities" with pacing herself.  Ms. MuVillescasas goals in this program to increase her strength and stamina and to improve her balance - as she occasionally has to use a walker.  She will benefit from the consistent exercise aspect of this program.     Continued Psychosocial  Services Needed No      Psychosocial Re-Evaluation:     Psychosocial Re-Evaluation      07/24/15 0940           Psychosocial Re-Evaluation   Comments Counselor follow up with Ms. Willhoite today reporting she has better balance now and is no longer using her walker at home at all.  She brings it to this program just to carry her oxygen tank.  She states she is experiencing more stamina as well and able to walk and move more and for longer periods of time.  She continues to sleep well, eat well and have a positive attitude.  Counselor commended Ms. Bohanon for all her hard work and progress made so far.            Vocational Rehabilitation: Provide vocational rehab assistance to qualifying candidates.   Vocational Rehab Evaluation & Intervention:     Vocational Rehab - 07/03/15 1337    Initial Vocational Rehab Evaluation & Intervention   Assessment shows need for Vocational Rehabilitation No      Education: Education Goals: Education classes will be provided on a weekly basis, covering required topics. Participant will state understanding/return demonstration of topics presented.  Learning Barriers/Preferences:     Learning Barriers/Preferences - 07/03/15 1336    Learning Barriers/Preferences   Learning Barriers Hearing   Learning Preferences Video      Education Topics: General Nutrition Guidelines/Fats and Fiber: -Group instruction provided by verbal, written material, models and posters to present the general guidelines for  heart healthy nutrition. Gives an explanation and review of dietary fats and fiber.   Controlling Sodium/Reading Food Labels: -Group verbal and written material supporting the discussion of sodium use in heart healthy nutrition. Review and explanation with models, verbal and written materials for utilization of the food label.   Exercise Physiology & Risk Factors: - Group verbal and written instruction with models to review the exercise physiology of the cardiovascular system and associated critical values. Details cardiovascular disease risk factors and the goals associated with each risk factor.          Cardiac Rehab from 08/09/2015 in [facility] Cardiac and Pulmonary Rehab   Date  07/12/15   Educator  RM   Instruction Review Code  2- meets goals/outcomes      Aerobic Exercise & Resistance Training: - Gives group verbal and written discussion on the health impact of inactivity. On the components of aerobic and resistive training programs and the benefits of this training and how to safely progress through these programs.      Cardiac Rehab from 08/09/2015 in [facility] Cardiac and Pulmonary Rehab   Date  07/17/15   Educator  RM   Instruction Review Code  2- meets goals/outcomes      Flexibility, Balance, General Exercise Guidelines: - Provides group verbal and written instruction on the benefits of flexibility and balance training programs. Provides general exercise guidelines with specific guidelines to those with heart or lung disease. Demonstration and skill practice provided.      Cardiac Rehab from 08/09/2015 in [facility] Cardiac and Pulmonary Rehab   Date  07/24/15   Educator  RM   Instruction Review Code  2- meets goals/outcomes      Stress Management: - Provides group verbal and written instruction about the health risks of elevated stress, cause of high stress, and healthy ways to reduce stress.      Cardiac Rehab from 08/09/2015 in [facility] Cardiac and Pulmonary Rehab   Date  07/26/15    Educator  [place]   Instruction Review Code  2- meets goals/outcomes      Depression: - Provides group verbal and written instruction on the correlation between heart/lung disease and depressed mood, treatment options, and the stigmas associated with seeking treatment.   Anatomy & Physiology of the Heart: - Group verbal and written instruction and models provide basic cardiac anatomy and physiology, with the coronary electrical and arterial systems. Review of: AMI, Angina, Valve disease, Heart Failure, Cardiac Arrhythmia, Pacemakers, and the ICD.   Cardiac Procedures: - Group verbal and written instruction and models to describe the testing methods done to diagnose heart disease. Reviews the outcomes of the test results. Describes the treatment choices: Medical Management, Angioplasty, or Coronary Bypass Surgery.   Cardiac Medications: - Group verbal and written instruction to review commonly prescribed medications for heart disease. Reviews the medication, class of the drug, and side effects. Includes the steps to properly store meds and maintain the prescription regimen.   Go Sex-Intimacy & Heart Disease, Get SMART - Goal Setting: - Group verbal and written instruction through game format to discuss heart disease and the return to sexual intimacy. Provides group verbal and written material to discuss and apply goal setting through the application of the S.M.A.R.T. Method.   Other Matters of the Heart: - Provides group verbal, written materials and models to describe Heart Failure, Angina, Valve Disease, and Diabetes in the realm of heart disease. Includes description of the disease process and treatment options available to the cardiac patient.      Cardiac Rehab from 08/09/2015 in [facility] Cardiac and Pulmonary Rehab   Date  08/09/15   Educator  SB   Instruction Review Code  2- meets goals/outcomes      Exercise & Equipment Safety: - Individual verbal instruction and demonstration of  equipment use and safety with use of the equipment.      Cardiac Rehab from 08/09/2015 in [facility] Cardiac and Pulmonary Rehab   Date  07/03/15   Educator  SB   Instruction Review Code  2- meets goals/outcomes      Infection Prevention: - Provides verbal and written material to individual with discussion of infection control including proper hand washing and proper equipment cleaning during exercise session.      Cardiac Rehab from 08/09/2015 in [facility] Cardiac and Pulmonary Rehab   Date  07/03/15   Educator  Sb   Instruction Review Code  2- meets goals/outcomes      Falls Prevention: - Provides verbal and written material to individual with discussion of falls prevention and safety.      Cardiac Rehab from 08/09/2015 in [facility] Cardiac and Pulmonary Rehab   Date  07/03/15   Educator  Sb   Instruction Review Code  2- meets goals/outcomes      Diabetes: - Individual verbal and written instruction to review signs/symptoms of diabetes, desired ranges of glucose level fasting, after meals and with exercise. Advice that pre and post exercise glucose checks will be done for 3 sessions at entry of program.    Knowledge Questionnaire Score:     Knowledge Questionnaire Score - 07/03/15 1440    Knowledge Questionnaire Score   Pre Score 26/28      Personal Goals and Risk Factors at Admission:     Personal Goals and Risk Factors at Admission - 07/03/15 1339    Personal Goals and Risk Factors on Admission   Increase Aerobic Exercise and Physical Activity Yes;Sedentary  Balance concerns ,  using walker for ambulation.   Intervention While in program, learn and follow the exercise prescription taught. Start at a low level workload and increase workload after able to maintain previous level for 30 minutes. Increase time before increasing intensity.   Intervention Provide exercise education and an individualized exercise prescription that will provide continued progressive overload as per policy  without signs/symptoms of physical distress.   Diabetes No   Hypertension Yes   Goal Participant will see blood pressure controlled within the values of 140/72m/Hg or within value directed by their physician.   Intervention Provide nutrition & aerobic exercise along with prescribed medications to achieve BP 140/90 or less.   Lipids Yes   Goal Cholesterol controlled with medications as prescribed, with individualized exercise RX and with personalized nutrition plan. Value goals: LDL < 717m HDL > 4076mParticipant states understanding of desired cholesterol values and following prescriptions.   Intervention Provide nutrition & aerobic exercise along with prescribed medications to achieve LDL <57m72mDL >40mg39m   Personal Goals and Risk Factors Review:      Goals and Risk Factor Review      07/17/15 1007 08/02/15 1511         Increase Aerobic Exercise and Physical Activity   Goals Progress/Improvement seen   Yes      Comments Patient discussed her desire to get back to "normal" and was talking about some of the exercise she used to do before he heart episode. She said that after her first week of class she was a little sore. We discussed that that can be a normal response of the body when a new activity is preformed, and how over time the soreness could go away once he muscles get stronger and used to the new activity. Education on cross training/ the body's response to new activities was given.  Danyel Itaed today she can bend down and tie her shoes.  Today she was having a flare of her bursitis in her leg. No treadmill time because of the bursitis pain.  She is walking more at home and her husband bought her a cane to help her stay balanced. We talked about the distance it is to walk from the hosptal entrance to the rehab gym. Her husband wheels her down. Her goal is to be able to walk from the class to the front door without wheelchair assistance by Dec 19th.       Hypertension   Progress  seen toward goals  Yes      Comments  Roisin's BP remains in normal range. She will see RD in Dec on the 19th to review nutrition plan.       Abnormal Lipids   Progress seen towards goals  Unknown      Comments  No labs to review. Esme Ozzie have an appointment with the RD in December.          Personal Goals Discharge (Final Personal Goals and Risk Factors Review):      Goals and Risk Factor Review - 08/02/15 1511    Increase Aerobic Exercise and Physical Activity   Goals Progress/Improvement seen  Yes   Comments Reeshemah Keyaunaed today she can bend down and tie her shoes.  Today she was having a flare of her bursitis in her leg. No treadmill time because of the bursitis pain.  She is walking more at home and her husband bought her a cane to help her stay balanced. We talked about the distance it  is to walk from the hosptal entrance to the rehab gym. Her husband wheels her down. Her goal is to be able to walk from the class to the front door without wheelchair assistance by Dec 19th.    Hypertension   Progress seen toward goals Yes   Comments Deandrea's BP remains in normal range. She will see RD in Dec on the 19th to review nutrition plan.    Abnormal Lipids   Progress seen towards goals Unknown   Comments No labs to review. Katelan will have an appointment with the RD in December.       ITP Comments:     ITP Comments      08/14/15 1211 09/10/15 1215         ITP Comments 30 day review preparation: Continue with ITP. Ready for 30 day review.  Continue with ITP  Remains out with medical concerns.  Has appoinment mid JAn with cardiologist regarding pacemaker qustions.         Comments:

## 2015-09-13 NOTE — Addendum Note (Signed)
Addended by: Lynford Humphrey on: 09/13/2015 10:34 AM   Modules accepted: Orders

## 2015-09-15 ENCOUNTER — Ambulatory Visit: Payer: Medicare PPO

## 2015-09-20 ENCOUNTER — Ambulatory Visit (INDEPENDENT_AMBULATORY_CARE_PROVIDER_SITE_OTHER): Payer: Medicare Other

## 2015-09-20 DIAGNOSIS — I4891 Unspecified atrial fibrillation: Secondary | ICD-10-CM

## 2015-09-20 DIAGNOSIS — Z953 Presence of xenogenic heart valve: Secondary | ICD-10-CM

## 2015-09-20 DIAGNOSIS — I340 Nonrheumatic mitral (valve) insufficiency: Secondary | ICD-10-CM | POA: Diagnosis not present

## 2015-09-20 LAB — POCT INR: INR: 2

## 2015-09-22 ENCOUNTER — Ambulatory Visit: Payer: Medicare PPO

## 2015-09-26 ENCOUNTER — Encounter: Payer: Self-pay | Admitting: *Deleted

## 2015-09-29 ENCOUNTER — Ambulatory Visit: Payer: Medicare PPO

## 2015-09-29 ENCOUNTER — Telehealth: Payer: Self-pay | Admitting: *Deleted

## 2015-09-29 NOTE — Telephone Encounter (Signed)
S/w pt who reports 5lb weight gain, bilateral ankle swelling and abdominal swelling since last week. Reports slight SOB. Two days ago she increased her lasix to 40mg  BID 2 days ago and has not seen an increase in output. She feels the extra fluid is d/t non-compliance w/low sodium diet for the past week. Reviewed importance of dietary restrictions. Advised pt to resume low sodium diet, elevate legs, watch fluids, and continue to monitor output. BUN and creatnine normal on 12/28. Since pt has doubled her lasix, I will forward to Dr. Fletcher Anon to advise how long to continue this. Pt is concerned that even with increased lasix, she is not losing the fluid.

## 2015-09-29 NOTE — Telephone Encounter (Signed)
Pt is calling stating she is having issues with gaining weight, doubled on on fluid pills and its still not working.  Would like to know what can she do.  Please advise.

## 2015-10-02 ENCOUNTER — Other Ambulatory Visit: Payer: Self-pay

## 2015-10-02 DIAGNOSIS — I509 Heart failure, unspecified: Secondary | ICD-10-CM

## 2015-10-02 NOTE — Telephone Encounter (Signed)
S/w pt of Dr. Tyrell Antonio recommendations. Pt reports 3lbs weight loss since our Friday conversation. States she has been following low sodium diet. Pt agreeable to labs w/no further questions.

## 2015-10-02 NOTE — Telephone Encounter (Signed)
Continue Lasix 40 mg bid. She is coming to see Dr. Caryl Comes tomorrow. Check BMP and BNP during visit.

## 2015-10-02 NOTE — Telephone Encounter (Signed)
No answer, no VM on home number. Will call back. Labs ordered.

## 2015-10-03 ENCOUNTER — Encounter: Payer: Self-pay | Admitting: Internal Medicine

## 2015-10-03 ENCOUNTER — Ambulatory Visit (INDEPENDENT_AMBULATORY_CARE_PROVIDER_SITE_OTHER): Payer: Medicare Other | Admitting: Internal Medicine

## 2015-10-03 VITALS — BP 120/70 | HR 74 | Ht 61.5 in | Wt 136.4 lb

## 2015-10-03 DIAGNOSIS — Z950 Presence of cardiac pacemaker: Secondary | ICD-10-CM

## 2015-10-03 DIAGNOSIS — I509 Heart failure, unspecified: Secondary | ICD-10-CM

## 2015-10-03 DIAGNOSIS — I480 Paroxysmal atrial fibrillation: Secondary | ICD-10-CM

## 2015-10-03 DIAGNOSIS — I442 Atrioventricular block, complete: Secondary | ICD-10-CM

## 2015-10-03 LAB — CUP PACEART INCLINIC DEVICE CHECK
Brady Statistic AP VS Percent: 0.12 %
Brady Statistic AS VP Percent: 0.56 %
Brady Statistic RA Percent Paced: 99.43 %
Date Time Interrogation Session: 20170124161128
Implantable Lead Implant Date: 20160823
Implantable Lead Location: 753859
Implantable Lead Model: 5076
Lead Channel Impedance Value: 418 Ohm
Lead Channel Impedance Value: 551 Ohm
Lead Channel Pacing Threshold Amplitude: 0.75 V
Lead Channel Pacing Threshold Pulse Width: 0.4 ms
Lead Channel Sensing Intrinsic Amplitude: 2 mV
Lead Channel Sensing Intrinsic Amplitude: 6.8 mV
Lead Channel Setting Pacing Amplitude: 2 V
Lead Channel Setting Pacing Amplitude: 2.5 V
Lead Channel Setting Sensing Sensitivity: 2.8 mV
MDC IDC LEAD IMPLANT DT: 20160823
MDC IDC LEAD LOCATION: 753860
MDC IDC MSMT BATTERY REMAINING LONGEVITY: 104 mo
MDC IDC MSMT BATTERY VOLTAGE: 3.02 V
MDC IDC MSMT LEADCHNL RA IMPEDANCE VALUE: 361 Ohm
MDC IDC MSMT LEADCHNL RA PACING THRESHOLD PULSEWIDTH: 0.4 ms
MDC IDC MSMT LEADCHNL RV IMPEDANCE VALUE: 475 Ohm
MDC IDC MSMT LEADCHNL RV PACING THRESHOLD AMPLITUDE: 0.5 V
MDC IDC SET LEADCHNL RV PACING PULSEWIDTH: 0.4 ms
MDC IDC STAT BRADY AP VP PERCENT: 99.31 %
MDC IDC STAT BRADY AS VS PERCENT: 0 %
MDC IDC STAT BRADY RV PERCENT PACED: 99.88 %

## 2015-10-03 NOTE — Progress Notes (Signed)
Patient Care Team: Ezequiel Kayser, MD as PCP - General (Internal Medicine)   HPI  [name] is a 81 y.o. female Seen in follow-up for a pacemaker implanted 8/16 in the context of multi-valvular surgery bioprosthetic mitral valve replacement, tricuspid valve repair and MAZE operation. She developed postoperative complete heart block [facility])  She has a history of atrial fibrillation  Records and Results Reviewed  Including hospital records. Echocardiogram 5/16 demonstrated normal LV function.  Subsequent echocardiogram demonstrated an ejection fraction of 30-35%.  We saw her in November and reprogrammed to device post-implant. We activated rate response. she has been a significant worsening in exercise tolerance. Echocardiogram was repeated as above  We are seeing her today at Dr. Lynann Beaver request for consideration of CRT upgrade.     Past Medical History  Diagnosis Date  . Hypertension   . COPD (chronic obstructive pulmonary disease) ([place])   . Hyperlipidemia   . Meniere disease   . HX: breast cancer   . PAF (paroxysmal atrial fibrillation) ([place])   . COPD (chronic obstructive pulmonary disease) ([place])   . Hypothyroidism   . Breast cancer ([place]) 08/09/2001    right/rad  . Pulmonary hypertension ([place])   . Chronic diastolic CHF (congestive heart failure) ([place])   . PONV (postoperative nausea and vomiting)   . Anxiety   . GERD (gastroesophageal reflux disease)   . Arthritis   . Severe Mitral Regurgitation s/p MV Repair     a. 04/2015 s/p 27 mm [facility] Mitral bovine bioprosthetic tissue valve  . Tricuspid Regurgitation s/p Repair     a. 04/2015 s/p 26 mm Edwards mc3 ring annuloplasty  . 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 04/2015 Complete bilateral atrial lesion set using cryothermy and bipolar radiofrequency ablation with clipping of LA appendage  . CAD s/p CABG x 1     a. 04/2015 LIMA to LAD  . Post-surgical complete heart block, symptomatic     a. 04/2015 s/p MDT  [id number] Advisa DR MRI DC PPM.    Past Surgical History  Procedure Laterality Date  . Cochlear implant Bilateral   . Mastectomy, partial Right   . Cardiac catheterization  11/2013    [facility]  . Cardiac catheterization  10/2014    [facility]  . Breast biopsy Left 11/25/06    neg  . Breast biopsy Left 01/20/12    lt bx /clip-neg  . Eye surgery Bilateral 2014    cataract surgery  . Mitral valve repair N/A 04/25/2015    Procedure: MITRAL VALVE  REPLACEMENT using a 27 mm Edwards Perimount Magna Mitral Ease Valve;  Surgeon: Rexene Alberts, MD;  Location: [place];  Service: Open Heart Surgery;  Laterality: N/A;  . Tricuspid valve replacement N/A 04/25/2015    Procedure: TRICUSPID VALVE REPAIR;  Surgeon: Rexene Alberts, MD;  Location: [place];  Service: Open Heart Surgery;  Laterality: N/A;  . Maze N/A 04/25/2015    Procedure: MAZE;  Surgeon: Rexene Alberts, MD;  Location: [place];  Service: Open Heart Surgery;  Laterality: N/A;  . Tee without cardioversion N/A 04/25/2015    Procedure: TRANSESOPHAGEAL ECHOCARDIOGRAM (TEE);  Surgeon: Rexene Alberts, MD;  Location: [place];  Service: Open Heart Surgery;  Laterality: N/A;  . Clipping of atrial appendage N/A 04/25/2015    Procedure: CLIPPING OF ATRIAL APPENDAGE;  Surgeon: Rexene Alberts, MD;  Location: [place];  Service: Open Heart Surgery;  Laterality: N/A;  .  Ep implantable device N/A 05/02/2015    Procedure: Pacemaker Implant;  Surgeon: Thompson Grayer, MD;  Location: [place] [facility];  Service: Cardiovascular;  Laterality: N/A;  . Sternal wound debridement N/A 05/09/2015    Procedure: STERNAL WOUND DEBRIDEMENT;  Surgeon: Rexene Alberts, MD;  Location: [place];  Service: Thoracic;  Laterality: N/A;  . Application of wound vac N/A 05/09/2015    Procedure: APPLICATION OF WOUND VAC;  Surgeon: Rexene Alberts, MD;  Location: [facility];  Service: Thoracic;  Laterality: N/A;  . Coronary artery bypass graft N/A 04/25/2015    Procedure: CORONARY ARTERY BYPASS GRAFTING (CABG) x ONE,  using left internal mammary artery;  Surgeon: Rexene Alberts, MD;  Location: [place];  Service: Open Heart Surgery;  Laterality: N/A;  . Sternal wires removal N/A 06/05/2015    Procedure: STERNAL WIRES REMOVAL;  Surgeon: Rexene Alberts, MD;  Location: [facility];  Service: Thoracic;  Laterality: N/A;    Current Outpatient Prescriptions  Medication Sig Dispense Refill  . acetaminophen (TYLENOL) 325 MG tablet Take 2 tablets (650 mg total) by mouth every 4 (four) hours as needed for mild pain (or Fever >/= 101).    Marland Kitchen aspirin EC 81 MG EC tablet Take 1 tablet (81 mg total) by mouth daily.    . benzonatate (TESSALON) 100 MG capsule Take 100 mg by mouth 3 (three) times daily as needed for cough (for cold).     . bisoprolol (ZEBETA) 10 MG tablet Take 1 tablet (10 mg total) by mouth daily. 30 tablet 5  . fluticasone (FLONASE) 50 MCG/ACT nasal spray Place 1 spray into both nostrils daily as needed for allergies.     . furosemide (LASIX) 20 MG tablet Take 20 mg by mouth 2 (two) times daily.     Marland Kitchen gabapentin (NEURONTIN) 100 MG capsule Take 100 mg by mouth daily.  1  . Indacaterol Maleate 75 MCG CAPS Place into inhaler and inhale daily.    Marland Kitchen ipratropium-albuterol (DUONEB) 0.5-2.5 (3) MG/3ML SOLN Take 3 mLs by nebulization every 6 (six) hours as needed (for shortness of breath). 360 mL 10  . levothyroxine (SYNTHROID, LEVOTHROID) 88 MCG tablet Take 88 mcg by mouth daily before breakfast.    . losartan (COZAAR) 100 MG tablet Take 1 tablet (100 mg total) by mouth daily. 30 tablet 3  . pantoprazole (PROTONIX) 40 MG tablet Take 40 mg by mouth 2 (two) times daily.     . potassium chloride SA (K-DUR,KLOR-CON) 20 MEQ tablet Take 1 tablet (20 mEq total) by mouth daily.    . simvastatin (ZOCOR) 20 MG tablet Take 20 mg by mouth daily.    Marland Kitchen spironolactone (ALDACTONE) 25 MG tablet Take 1 tablet (25 mg total) by mouth daily. 30 tablet 6  . Umeclidinium Bromide (INCRUSE ELLIPTA) 62.5 MCG/INH AEPB Inhale 1 puff into the lungs  daily. 1 each 11  . warfarin (COUMADIN) 2.5 MG tablet Take 1 tablet (2.5 mg total) by mouth as directed. 30 tablet 3   No current facility-administered medications for this visit.    Allergies  Allergen Reactions  . Augmentin [Amoxicillin-Pot Clavulanate] Swelling    Swollen joints, pain  . Nifedipine     Other reaction(s): Unknown  . Penicillins     Other reaction(s): Other (See Comments) Swollen joints  . Propranolol     Other reaction(s): Unknown  . Ace Inhibitors     Other reaction(s): Cough  . Diazepam     Other reaction(s): Other (See Comments) Made  her "hyper"  . Meperidine     Other reaction(s): Other (See Comments) Made her "hyper"  . Propoxyphene     Other reaction(s): Other (See Comments) Made her "hyper"      Review of Systems negative except from HPI and PMH  Physical Exam BP 120/70 mmHg  Pulse 74  Ht 5' 1.5" (1.562 m)  Wt 136 lb 6.4 oz (61.871 kg)  BMI 25.36 kg/m2  SpO2 95% Well developed and well nourished in no acute distress HENT normal E scleral and icterus clear Neck Supple JVP flat; carotids brisk and full Clear to ausculation Device pocket well healed; without hematoma or erythema.  There is no tethering Regular rate and rhythm, no murmurs gallops or rub Soft with active bowel sounds No clubbing cyanosis  Edema Alert and oriented, grossly normal motor and sensory function Skin Warm and Dry  ECG demonstrates AV pacing  Assessment and  Plan   atrial fibrillation-paroxysmal status post maze operation  Complete heart block  Sinus node dysfunction-arrest  Status post mitral valve replacement-bioprosthetic tricuspid valve repair  Pacemaker-[employer]    her pacemaker was reprogrammed today to allow for intrinsic AV conduction. We also decreased the slope of her ADL rate she walked around the office and felt exceedingly much better. Repeat ECG demonstrated a narrow QRS.   Reassessment of LV function with RV pacing off when she comes  back to see Dr. Gaylyn Cheers next month would be appropriate.   For now, there is no role for upgrade

## 2015-10-03 NOTE — Patient Instructions (Signed)
Medication Instructions: - no changes  Labwork: - Your physician recommends that you rhave lab work today: BMP/ BNP (for Dr. Fletcher Anon)   Procedures/Testing: - none  Follow-Up: - Remote monitoring is used to monitor your Pacemaker of ICD from home. This monitoring reduces the number of office visits required to check your device to one time per year. It allows [place] to keep an eye on the functioning of your device to ensure it is working properly. You are scheduled for a device check from home on 01/02/16. You may send your transmission at any time that day. If you have a wireless device, the transmission will be sent automatically. After your physician reviews your transmission, you will receive a postcard with your next transmission date.  - Your physician wants you to follow-up in: August 2017 with Dr. Caryl Comes. You will receive a reminder letter in the mail two months in advance. If you don't receive a letter, please call our office to schedule the follow-up appointment.  Any Additional Special Instructions Will Be Listed Below (If Applicable).     If you need a refill on your cardiac medications before your next appointment, please call your pharmacy.

## 2015-10-04 LAB — BASIC METABOLIC PANEL
BUN/Creatinine Ratio: 23 (ref 11–26)
BUN: 22 mg/dL (ref 8–27)
CALCIUM: 9.6 mg/dL (ref 8.7–10.3)
CO2: 25 mmol/L (ref 18–29)
CREATININE: 0.97 mg/dL (ref 0.57–1.00)
Chloride: 95 mmol/L — ABNORMAL LOW (ref 96–106)
GFR calc Af Amer: 65 mL/min/{1.73_m2} (ref >59)
GFR, EST NON AFRICAN AMERICAN: 56 mL/min/{1.73_m2} — AB (ref >59)
GLUCOSE: 119 mg/dL — AB (ref 65–99)
POTASSIUM: 5 mmol/L (ref 3.5–5.2)
Sodium: 139 mmol/L (ref 134–144)

## 2015-10-04 LAB — SPECIMEN STATUS

## 2015-10-04 LAB — BRAIN NATRIURETIC PEPTIDE

## 2015-10-06 ENCOUNTER — Ambulatory Visit: Payer: Medicare PPO

## 2015-10-09 ENCOUNTER — Encounter: Payer: Self-pay | Admitting: Thoracic Surgery (Cardiothoracic Vascular Surgery)

## 2015-10-09 ENCOUNTER — Ambulatory Visit (INDEPENDENT_AMBULATORY_CARE_PROVIDER_SITE_OTHER): Payer: Medicare Other | Admitting: Thoracic Surgery (Cardiothoracic Vascular Surgery)

## 2015-10-09 VITALS — BP 120/68 | HR 86 | Resp 20 | Ht 61.5 in | Wt 136.0 lb

## 2015-10-09 DIAGNOSIS — Z8679 Personal history of other diseases of the circulatory system: Secondary | ICD-10-CM | POA: Diagnosis not present

## 2015-10-09 DIAGNOSIS — Z953 Presence of xenogenic heart valve: Secondary | ICD-10-CM

## 2015-10-09 DIAGNOSIS — Z9889 Other specified postprocedural states: Secondary | ICD-10-CM

## 2015-10-09 NOTE — Progress Notes (Signed)
[address]       [place] [address]             [phone]     CARDIOTHORACIC SURGERY OFFICE NOTE  Referring Provider is Wellington Hampshire, MD PCP is Ezequiel Kayser, MD   HPI:  Patient is a 81 year old female who returns to the office for routine follow-up today approximately 6 months status post mitral valve replacement using a bioprosthetic tissue valve, tricuspid valve repair, coronary artery bypass grafting 1, and Maze procedure on 04/25/2015. Her immediate postoperative course was notable for the development of complete heart block for which she underwent permanent pacemaker placement. She later developed superficial breakdown of the inferior aspect of her sternotomy incision which healed by secondary intent following application of a wound VAC.  She was last seen here in our office on 07/10/2015 at which time she was doing well and making satisfactory progress.  Her sternotomy incision had essentially completely healed by that time. The patient states that she continued to do fairly well for the next month, and she enrolled in outpatient cardiac rehabilitation program. However, beginning in December she began to develop worsening symptoms of exertional shortness of breath and fatigue.  A follow-up echocardiogram was performed in December and notable for significant drop in left ventricular ejection fraction to 30-35%. The mitral valve was functioning normally.  She was referred back to Dr. Caryl Comes to consider pacemaker upgrade to a biventricular pacer for CRT.  She was seen in follow-up by Dr. Caryl Comes last week on 10/03/2015. At that time her pacemaker was reprogrammed to allow for intrinsic AV conduction. Repeat EKG revealed a narrow QRS and the patient reportedly immediately felt much better. Plans for CRT upgrade were deferred and follow-up transthoracic echocardiogram has been recommended.   The patient returns to our office today for routine follow-up. She states  that she feels much better ever since her pacemaker was reprogrammed last week. She is still quite weak, but already her exercise tolerance and breathing has improved. She has no longer requiring oxygen during the daytime. She plans to resume the cardiac rehabilitation program next week. She states that she did fall in church last week. She developed some soreness in her mid chest after the fall. She denies any fevers or chills. She had a tiny blister on her sternotomy scar that burst. There has been no further drainage. She still feels sore but she denies any sensation of clicking or motion of the sternum.   Current Outpatient Prescriptions  Medication Sig Dispense Refill  . acetaminophen (TYLENOL) 325 MG tablet Take 2 tablets (650 mg total) by mouth every 4 (four) hours as needed for mild pain (or Fever >/= 101).    Marland Kitchen aspirin EC 81 MG EC tablet Take 1 tablet (81 mg total) by mouth daily.    . benzonatate (TESSALON) 100 MG capsule Take 100 mg by mouth 3 (three) times daily as needed for cough (for cold).     . bisoprolol (ZEBETA) 10 MG tablet Take 1 tablet (10 mg total) by mouth daily. 30 tablet 5  . fluticasone (FLONASE) 50 MCG/ACT nasal spray Place 1 spray into both nostrils daily as needed for allergies.     . furosemide (LASIX) 20 MG tablet Take 20 mg by mouth 2 (two) times daily.     Marland Kitchen gabapentin (NEURONTIN) 100 MG capsule Take 100 mg by mouth daily.  1  . Indacaterol Maleate 75 MCG CAPS Place into inhaler and inhale  daily.    . ipratropium-albuterol (DUONEB) 0.5-2.5 (3) MG/3ML SOLN Take 3 mLs by nebulization every 6 (six) hours as needed (for shortness of breath). 360 mL 10  . levothyroxine (SYNTHROID, LEVOTHROID) 88 MCG tablet Take 88 mcg by mouth daily before breakfast.    . losartan (COZAAR) 100 MG tablet Take 1 tablet (100 mg total) by mouth daily. 30 tablet 3  . pantoprazole (PROTONIX) 40 MG tablet Take 40 mg by mouth 2 (two) times daily.     . potassium chloride SA (K-DUR,KLOR-CON) 20  MEQ tablet Take 1 tablet (20 mEq total) by mouth daily.    . simvastatin (ZOCOR) 20 MG tablet Take 20 mg by mouth daily.    Marland Kitchen spironolactone (ALDACTONE) 25 MG tablet Take 1 tablet (25 mg total) by mouth daily. 30 tablet 6  . Umeclidinium Bromide (INCRUSE ELLIPTA) 62.5 MCG/INH AEPB Inhale 1 puff into the lungs daily. 1 each 11  . warfarin (COUMADIN) 2.5 MG tablet Take 1 tablet (2.5 mg total) by mouth as directed. 30 tablet 3   No current facility-administered medications for this visit.      Physical Exam:   BP 120/68 mmHg  Pulse 86  Resp 20  Ht 5' 1.5" (1.562 m)  Wt 136 lb (61.689 kg)  BMI 25.28 kg/m2  SpO2 93%  General:  Well-appearing  Chest:   Clear to auscultation  CV:   Regular rate and rhythm without murmur  Incisions:  Completely healed. Sternum is stable on palpation. There is no surrounding erythema.  Abdomen:  Soft and nontender  Extremities:  Warm and well perfused, no lower extremity edema  Diagnostic Tests:  Transthoracic Echocardiography  Patient:  [name], [place] MR #:    [id number] Study Date: 08/22/2015 Gender:   F Age:    81 Height:   157.5 cm Weight:   61.7 kg BSA:    1.65 m^2 Pt. Status: Room:  ATTENDING  Default, Provider [phone] [place], MD [place], MD PERFORMING  [place], [place] SONOGRAPHER Pilar Jarvis, RVT, RDCS, RDMS  cc:  ------------------------------------------------------------------- LV EF: 30% -  35%  ------------------------------------------------------------------- History:  PMH: Patient is s/p CABG x 1, tricuspid valve repair and mitral valve replacement performed on 04/25/15. The mitral valve is a 87mm [employer]support[employer]administrative [employer]. Today she c/o progressive DOE. Atrial fibrillation. Coronary artery disease. Primary pulmonary hypertension. Risk factors:  Hypertension. Dyslipidemia.  ------------------------------------------------------------------- Study Conclusions  - Left ventricle: The cavity size was moderately dilated. There was mild concentric hypertrophy. Systolic function was moderately to severely reduced. The estimated ejection fraction was in the range of 30% to 35%. Diffuse hypokinesis. Features are consistent with a pseudonormal left ventricular filling pattern, with concomitant abnormal relaxation and increased filling pressure (grade 2 diastolic dysfunction). - Mitral valve: Prior procedures included surgical repair and appears to be functioning normally. Mean gradient (D): 3 mm Hg. - Left atrium: The atrium was mildly dilated. - Pulmonary arteries: Systolic pressure was within the normal range.  Impressions:  - Since previous echo in May, the mitral valve has been repaired, LVEF has worsened and pulmonary hypertension resolved.  Transthoracic echocardiography. M-mode, complete 2D, spectral Doppler, and color Doppler. Birthdate: Patient birthdate: 08/23/1935. Age: Patient is 81 yr old. Sex: Gender: female. BMI: 24.9 kg/m^2. Blood pressure:   126/72 Patient status: Outpatient. Study date: Study date: 08/22/2015. Study time: 03:51 PM.  -------------------------------------------------------------------  ------------------------------------------------------------------- Left ventricle: The cavity size was moderately dilated. There was mild concentric hypertrophy. Systolic function was  moderately to severely reduced. The estimated ejection fraction was in the range of 30% to 35%. Diffuse hypokinesis. Features are consistent with a pseudonormal left ventricular filling pattern, with concomitant abnormal relaxation and increased filling pressure (grade 2 diastolic dysfunction).  ------------------------------------------------------------------- Aortic valve:  Trileaflet; normal  thickness leaflets. Mobility was not restricted. Doppler: Transvalvular velocity was within the normal range. There was no stenosis. There was no regurgitation.  ------------------------------------------------------------------- Aorta: Aortic root: The aortic root was normal in size.  ------------------------------------------------------------------- Mitral valve: Prior procedures included surgical repair and appears to be functioning normally. Mobility was not restricted. Doppler: Transvalvular velocity was within the normal range. There was no evidence for stenosis. There was no regurgitation.  Valve area by pressure half-time: 3.01 cm^2. Indexed valve area by pressure half-time: 1.82 cm^2/m^2. Valve area by continuity equation (using LVOT flow): 1.08 cm^2. Indexed valve area by continuity equation (using LVOT flow): 0.65 cm^2/m^2.  Mean gradient (D): 3 mm Hg. Peak gradient (D): 8 mm Hg.  ------------------------------------------------------------------- Left atrium: The atrium was mildly dilated.  ------------------------------------------------------------------- Right ventricle: The cavity size was normal. Wall thickness was normal. Pacer wire or catheter noted in right ventricle. Systolic function was normal.  ------------------------------------------------------------------- Pulmonic valve:  Doppler: Transvalvular velocity was within the normal range. There was no evidence for stenosis.  ------------------------------------------------------------------- Tricuspid valve:  Structurally normal valve.  Doppler: Transvalvular velocity was within the normal range. There was trivial regurgitation.  ------------------------------------------------------------------- Pulmonary artery:  The main pulmonary artery was normal-sized. Systolic pressure was within the normal range.  ------------------------------------------------------------------- Right  atrium: The atrium was normal in size.  ------------------------------------------------------------------- Pericardium: There was no pericardial effusion.  ------------------------------------------------------------------- Systemic veins: Inferior vena cava: The vessel was normal in size.  ------------------------------------------------------------------- Measurements  Left ventricle              Value     Reference LV ID, ED, PLAX chordal      (H)   57.8 mm    43 - 52 LV ID, ES, PLAX chordal      (H)   51.6 mm    23 - 38 LV fx shortening, PLAX chordal  (L)   11  %    >=29 LV PW thickness, ED            11.8 mm    --------- IVS/LV PW ratio, ED            1.02      <=1.3 Stroke volume, 2D             50  ml    --------- Stroke volume/bsa, 2D           30  ml/m^2  --------- LV ejection fraction, 1-p A4C       36  %    --------- LV end-diastolic volume, 2-p       88  ml    --------- LV end-systolic volume, 2-p        55  ml    --------- LV ejection fraction, 2-p         38  %    --------- Stroke volume, 2-p            33  ml    --------- LV end-diastolic volume/bsa, 2-p     53  ml/m^2  --------- LV end-systolic volume/bsa, 2-p      33  ml/m^2  --------- Stroke volume/bsa, 2-p          19.9 ml/m^2  --------- LV e&', lateral  2.87 cm/s   --------- LV E/e&', lateral             48.78     --------- LV e&', medial               3.07 cm/s   --------- LV E/e&', medial              45.6      --------- LV e&', average              2.97 cm/s   --------- LV E/e&', average             47.14     ---------  Ventricular septum             Value     Reference IVS thickness, ED             12  mm    ---------  LVOT                   Value     Reference LVOT ID, S                18  mm    --------- LVOT area                 2.54 cm^2   --------- LVOT ID                  18  mm    --------- LVOT peak velocity, S           97.8 cm/s   --------- LVOT mean velocity, S           62.5 cm/s   --------- LVOT VTI, S                19.7 cm    --------- LVOT peak gradient, S           4   mm Hg  --------- Stroke volume (SV), LVOT DP        50.1 ml    --------- Stroke index (SV/bsa), LVOT DP      30.3 ml/m^2  ---------  Aorta                   Value     Reference Aortic root ID, ED            28  mm    --------- Ascending aorta ID, A-P, S        25  mm    ---------  Left atrium                Value     Reference LA ID, A-P, ES              46  mm    --------- LA ID/bsa, A-P          (H)   2.78 cm/m^2  <=2.2 LA volume, S               61  ml    --------- LA volume/bsa, S             36.9 ml/m^2  --------- LA volume, ES, 1-p A4C          52  ml    --------- LA volume/bsa, ES, 1-p A4C        31.4 ml/m^2  --------- LA volume, ES, 1-p A2C          63  ml    --------- LA volume/bsa, ES, 1-p A2C        38.1 ml/m^2  ---------  Mitral valve               Value     Reference Mitral E-wave peak velocity        140  cm/s   --------- Mitral mean velocity, D          79  cm/s   --------- Mitral deceleration time         211  ms    150 - 230 Mitral pressure half-time         73  ms     --------- Mitral mean gradient, D          3   mm Hg  --------- Mitral peak gradient, D          8   mm Hg  --------- Mitral valve area, PHT, DP        3.01 cm^2   --------- Mitral valve area/bsa, PHT, DP      1.82 cm^2/m^2 --------- Mitral valve area, LVOT          1.08 cm^2   --------- continuity Mitral valve area/bsa, LVOT        0.65 cm^2/m^2 --------- continuity Mitral annulus VTI, D           46.3 cm    ---------  Tricuspid valve              Value     Reference Tricuspid maximal inflow         208  cm/s   --------- velocity, PISA  Legend: (L) and (H) mark values outside specified reference range.  ------------------------------------------------------------------- Prepared and Electronically Authenticated by  Kathlyn Sacramento, MD 2016-12-14T13:57:31   Impression:  Patient appears to be doing much better since her pacemaker was reprogrammed last week. She currently describes stable symptoms of exertional shortness of breath and fatigue consistent with chronic combined systolic and diastolic congestive heart failure, New [place] Heart Association functional class II.  There do not appear to be any sequelae following the fall she suffered last week with regards to her sternotomy scar and there is no sensation of instability to suggest sternal fracture.  Plan:  We have not recommended any changes to the patient's current medications at this time. The patient will continue to monitor her sternum closely and call to return to our office as soon as possible should she develop any redness, tenderness, drainage, fevers, or chills. The patient will continue to follow-up with Dr. Fletcher Anon and Dr. Caryl Comes for long-term management of her congestive heart failure and permanent pacemaker.  The patient has been reminded regarding the importance of dental hygiene and the  lifelong need for antibiotic prophylaxis for all dental cleanings and other related invasive procedures.  She will return to see [place] next August, a proximally 1 year following her original operation. She will call and return sooner should further problems or difficulties arise.  I spent in excess of 15 minutes during the conduct of this office consultation and >50% of this time involved direct face-to-face encounter with the patient for counseling and/or coordination of their care.  Valentina Gu. Roxy Manns, MD 10/09/2015 11:09 AM

## 2015-10-09 NOTE — Patient Instructions (Signed)

## 2015-10-10 ENCOUNTER — Encounter: Payer: Medicare PPO | Admitting: Internal Medicine

## 2015-10-11 NOTE — Addendum Note (Signed)
Addended by: Lynford Humphrey on: 10/11/2015 07:05 AM   Modules accepted: Orders

## 2015-10-13 ENCOUNTER — Ambulatory Visit (INDEPENDENT_AMBULATORY_CARE_PROVIDER_SITE_OTHER): Payer: Medicare Other | Admitting: Surgical

## 2015-10-13 VITALS — BP 111/56 | HR 80 | Temp 97.0°F | Resp 16 | Ht 61.5 in | Wt 136.0 lb

## 2015-10-13 DIAGNOSIS — T814XXS Infection following a procedure, sequela: Secondary | ICD-10-CM | POA: Diagnosis not present

## 2015-10-13 DIAGNOSIS — T148XXA Other injury of unspecified body region, initial encounter: Secondary | ICD-10-CM

## 2015-10-13 DIAGNOSIS — Z953 Presence of xenogenic heart valve: Secondary | ICD-10-CM

## 2015-10-13 DIAGNOSIS — IMO0001 Reserved for inherently not codable concepts without codable children: Secondary | ICD-10-CM

## 2015-10-13 DIAGNOSIS — L089 Local infection of the skin and subcutaneous tissue, unspecified: Secondary | ICD-10-CM

## 2015-10-13 HISTORY — DX: Local infection of the skin and subcutaneous tissue, unspecified: L08.9

## 2015-10-13 MED ORDER — CIPROFLOXACIN HCL 250 MG PO TABS: 250.0000 mg | ORAL_TABLET | Freq: Two times a day (BID) | ORAL | Status: DC

## 2015-10-13 NOTE — Patient Instructions (Signed)
Patient is instructed to take antibiotics as instructed Wound dressing changes as instructed

## 2015-10-13 NOTE — Progress Notes (Signed)
[address]       [place] [address]             [phone]                  Daylen R Stuck [place] Medical Record [id number] Date of Birth: 1935/05/20  Referring BB:[id number], Mertie Clause, MD Primary Cardiology: Primary Care:THIES, DAVID, MD  Chief Complaint:  Follow Up Visit Sternal drainage  Date of Procedure:04/25/2015  Preoperative Diagnosis:  Severe Mitral Regurgitation  Moderate-Severe Tricuspid Regurgitation  Coronary Artery Disease  Recurrent Persistent Atrial Fibrillation  Postoperative Diagnosis:Same  Procedure:  Mitral Valve Replacement Edwards Magna Mitral Bovine Bioprosthetic Tissue Valve (size 27 mm, model #7300TFX, serial [id number])   Tricuspid Valve Repair Edwards mc3 ring annuloplasty (size 34mm, model #4900, serial [id number])   Coronary Artery Bypass Grafting x1 Left internal mammary artery to distal left anterior descending coronary artery   Maze Procedure complete bilateral atrial atrial lesion set using bipolar radiofrequency and cryothermy ablation clipping of left atrial appendage (Atriclip size 2mm)  Surgeon:Clarence H. Roxy Manns, MD  Assistant:Kainan Patty Orson Ape, PA-C  Anesthesia:Robert Ola Spurr, MD  Operative Findings:  Rheumatic and degenerative mitral valve disease  Type IIIA and type I mitral valve dysfunction with severe mitral regurgitation  Type I (functional) tricuspid valve dysfunction with moderate-severe (3+) tricuspid regurgitation  Normal LV systolic function  Good quality LIMA conduit for grafting  Good quality target vessel for grafting  Fragile epicardial fat over entire right ventricular surface   History of Present Illness:      The patient is seen in the office on today's date and she has developed some drainage from the lower pole of her sternal incision. She  notices this yesterday. She is not having fevers, chills or other constitutional symptoms. She has not noted any sternal instability. She is status post the above described procedure. She has had some difficulty with the sternal incision including necessitating sternal wire removal as well as use of the VAC drainage device. She has allergies to penicillin. She is on chronic Coumadin. She does report that overall she feels pretty well and continues to have improvements that she relates to the adjustments made with the pacemaker recently.       Zubrod Score: At the time of surgery this patient's most appropriate activity status/level should be described as: []     0    Normal activity, no symptoms []     1    Restricted in physical strenuous activity but ambulatory, able to do out light work []     2    Ambulatory and capable of self care, unable to do work activities, up and about                 >50 % of waking hours                                                                                   []     3    Only limited self care, in bed greater than 50% of waking hours []     4    Completely disabled, no self care, confined to  bed or chair []     5    Moribund  History  Smoking status  . Former Smoker -- 101 years  . Types: Cigarettes  . Quit date: 04/20/1998  Smokeless tobacco  . Not on file       Allergies  Allergen Reactions  . Augmentin [Amoxicillin-Pot Clavulanate] Swelling    Swollen joints, pain  . Nifedipine     Other reaction(s): Unknown  . Penicillins     Other reaction(s): Other (See Comments) Swollen joints  . Propranolol     Other reaction(s): Unknown  . Ace Inhibitors     Other reaction(s): Cough  . Diazepam     Other reaction(s): Other (See Comments) Made her "hyper"  . Meperidine     Other reaction(s): Other (See Comments) Made her "hyper"  . Propoxyphene     Other reaction(s): Other (See Comments) Made her "hyper"    Current Outpatient Prescriptions    Medication Sig Dispense Refill  . acetaminophen (TYLENOL) 325 MG tablet Take 2 tablets (650 mg total) by mouth every 4 (four) hours as needed for mild pain (or Fever >/= 101).    Marland Kitchen aspirin EC 81 MG EC tablet Take 1 tablet (81 mg total) by mouth daily.    . benzonatate (TESSALON) 100 MG capsule Take 100 mg by mouth 3 (three) times daily as needed for cough (for cold).     . bisoprolol (ZEBETA) 10 MG tablet Take 1 tablet (10 mg total) by mouth daily. 30 tablet 5  . fluticasone (FLONASE) 50 MCG/ACT nasal spray Place 1 spray into both nostrils daily as needed for allergies.     . furosemide (LASIX) 20 MG tablet Take 20 mg by mouth 2 (two) times daily.     Marland Kitchen gabapentin (NEURONTIN) 100 MG capsule Take 100 mg by mouth daily.  1  . Indacaterol Maleate 75 MCG CAPS Place into inhaler and inhale daily.    Marland Kitchen ipratropium-albuterol (DUONEB) 0.5-2.5 (3) MG/3ML SOLN Take 3 mLs by nebulization every 6 (six) hours as needed (for shortness of breath). 360 mL 10  . levothyroxine (SYNTHROID, LEVOTHROID) 88 MCG tablet Take 88 mcg by mouth daily before breakfast.    . losartan (COZAAR) 100 MG tablet Take 1 tablet (100 mg total) by mouth daily. 30 tablet 3  . pantoprazole (PROTONIX) 40 MG tablet Take 40 mg by mouth 2 (two) times daily.     . potassium chloride SA (K-DUR,KLOR-CON) 20 MEQ tablet Take 1 tablet (20 mEq total) by mouth daily.    . simvastatin (ZOCOR) 20 MG tablet Take 20 mg by mouth daily.    Marland Kitchen spironolactone (ALDACTONE) 25 MG tablet Take 1 tablet (25 mg total) by mouth daily. 30 tablet 6  . Umeclidinium Bromide (INCRUSE ELLIPTA) 62.5 MCG/INH AEPB Inhale 1 puff into the lungs daily. 1 each 11  . warfarin (COUMADIN) 2.5 MG tablet Take 1 tablet (2.5 mg total) by mouth as directed. 30 tablet 3  . ciprofloxacin (CIPRO) 250 MG tablet Take 1 tablet (250 mg total) by mouth 2 (two) times daily. 14 tablet 0   No current facility-administered medications for this visit.       Physical Exam: BP 111/56 mmHg   Pulse 80  Temp(Src) 97 F (36.1 C) (Oral)  Resp 16  Ht 5' 1.5" (1.562 m)  Wt 136 lb (61.689 kg)  BMI 25.28 kg/m2  SpO2 94%  General appearance: alert, cooperative and no distress Wound: The sternal incision has a half centimeter opening with slightly purulent  drainage. It tracks down to the sternum however does not track in either the cephalad or pedal direction. There is no surrounding erythema. The sternum is stable without click or noted movements.  Diagnostic Studies & Laboratory data:         Recent Radiology Findings: No results found.    I have independently reviewed the above radiology findings and reviewed findings  with the patient.  Recent Labs: Lab Results  Component Value Date   WBC WILL FOLLOW 10/03/2015   HGB 8.9* 06/05/2015   HCT WILL FOLLOW 10/03/2015   PLT WILL FOLLOW 10/03/2015   GLUCOSE 119* 10/03/2015   ALT 19 06/05/2015   AST 18 06/05/2015   NA 139 10/03/2015   K 5.0 10/03/2015   CL 95* 10/03/2015   CREATININE 0.97 10/03/2015   BUN 22 10/03/2015   CO2 25 10/03/2015   TSH 6.238* 05/01/2015   INR 2.0 09/20/2015   HGBA1C 6.3* 04/21/2015      Assessment / Plan:  The patient has a superficial sternal wound infection which appears fairly small currently. We cultured the wound. We will pack the wound with iodoform daily and see in the office next Monday to reevaluate. There is some possibility that the wound may require further incising and debridement if this does not close with these measures. She does not appear septic and in fact looks quite well. We will start her on Cipro 250 mg by mouth twice a day for 1 week.       Cecily Lawhorne E 10/13/2015 10:54 AM

## 2015-10-16 ENCOUNTER — Telehealth: Payer: Self-pay | Admitting: Thoracic Surgery (Cardiothoracic Vascular Surgery)

## 2015-10-16 ENCOUNTER — Ambulatory Visit: Payer: Medicare PPO | Admitting: Cardiovascular Disease

## 2015-10-16 ENCOUNTER — Telehealth: Payer: Self-pay | Admitting: Pulmonary Disease

## 2015-10-16 ENCOUNTER — Ambulatory Visit (INDEPENDENT_AMBULATORY_CARE_PROVIDER_SITE_OTHER): Payer: Medicare Other | Admitting: Thoracic Surgery (Cardiothoracic Vascular Surgery)

## 2015-10-16 ENCOUNTER — Ambulatory Visit
Admission: RE | Admit: 2015-10-16 | Discharge: 2015-10-16 | Disposition: A | Discharge status: Home / Self Care | Payer: Medicare Other | Source: Ambulatory Visit | Attending: Thoracic Surgery (Cardiothoracic Vascular Surgery) | Admitting: Thoracic Surgery (Cardiothoracic Vascular Surgery)

## 2015-10-16 ENCOUNTER — Encounter: Payer: Self-pay | Admitting: Thoracic Surgery (Cardiothoracic Vascular Surgery)

## 2015-10-16 ENCOUNTER — Other Ambulatory Visit: Payer: Self-pay | Admitting: *Deleted

## 2015-10-16 ENCOUNTER — Encounter: Payer: Self-pay | Admitting: Internal Medicine

## 2015-10-16 VITALS — BP 106/67 | HR 81 | Resp 16 | Ht 61.5 in | Wt 136.0 lb

## 2015-10-16 DIAGNOSIS — L089 Local infection of the skin and subcutaneous tissue, unspecified: Secondary | ICD-10-CM

## 2015-10-16 DIAGNOSIS — Z953 Presence of xenogenic heart valve: Secondary | ICD-10-CM

## 2015-10-16 DIAGNOSIS — T148 Other injury of unspecified body region: Secondary | ICD-10-CM

## 2015-10-16 DIAGNOSIS — R079 Chest pain, unspecified: Secondary | ICD-10-CM

## 2015-10-16 DIAGNOSIS — T148XXA Other injury of unspecified body region, initial encounter: Secondary | ICD-10-CM

## 2015-10-16 NOTE — Progress Notes (Signed)
[address]       [place] [address]             [phone]     CARDIOTHORACIC SURGERY OFFICE NOTE  Referring Provider is Wellington Hampshire, MD PCP is Ezequiel Kayser, MD   HPI:  Patient returns to the office today for wound check. Last week she developed drainage from a small area at the inferior aspect of her median sternotomy scar. She was evaluated in the office by Jadene Pierini and a wound culture was sent. Results remain pending at this time. She was started on oral ciprofloxacin and dressing changes which have been performed by the patient's husband. The patient returns to the office today and states that she continues to feel well. She specifically denies any symptoms of chest pain, fever, chills, diaphoresis. There has been a scant amount of drainage in the sponge with every wound change in the drainage has decreased in amount and cleaned up in character according to the patient's husband.   Current Outpatient Prescriptions  Medication Sig Dispense Refill  . acetaminophen (TYLENOL) 325 MG tablet Take 2 tablets (650 mg total) by mouth every 4 (four) hours as needed for mild pain (or Fever >/= 101).    Marland Kitchen aspirin EC 81 MG EC tablet Take 1 tablet (81 mg total) by mouth daily.    . benzonatate (TESSALON) 100 MG capsule Take 100 mg by mouth 3 (three) times daily as needed for cough (for cold).     . bisoprolol (ZEBETA) 10 MG tablet Take 1 tablet (10 mg total) by mouth daily. 30 tablet 5  . ciprofloxacin (CIPRO) 250 MG tablet Take 1 tablet (250 mg total) by mouth 2 (two) times daily. 14 tablet 0  . fluticasone (FLONASE) 50 MCG/ACT nasal spray Place 1 spray into both nostrils daily as needed for allergies.     . furosemide (LASIX) 20 MG tablet Take 20 mg by mouth 2 (two) times daily.     Marland Kitchen gabapentin (NEURONTIN) 100 MG capsule Take 100 mg by mouth daily.  1  . Indacaterol Maleate 75 MCG CAPS Place into inhaler and inhale daily.    Marland Kitchen ipratropium-albuterol (DUONEB)  0.5-2.5 (3) MG/3ML SOLN Take 3 mLs by nebulization every 6 (six) hours as needed (for shortness of breath). 360 mL 10  . levothyroxine (SYNTHROID, LEVOTHROID) 88 MCG tablet Take 88 mcg by mouth daily before breakfast.    . losartan (COZAAR) 100 MG tablet Take 1 tablet (100 mg total) by mouth daily. 30 tablet 3  . pantoprazole (PROTONIX) 40 MG tablet Take 40 mg by mouth 2 (two) times daily.     . potassium chloride SA (K-DUR,KLOR-CON) 20 MEQ tablet Take 1 tablet (20 mEq total) by mouth daily.    . simvastatin (ZOCOR) 20 MG tablet Take 20 mg by mouth daily.    Marland Kitchen spironolactone (ALDACTONE) 25 MG tablet Take 1 tablet (25 mg total) by mouth daily. 30 tablet 6  . Umeclidinium Bromide (INCRUSE ELLIPTA) 62.5 MCG/INH AEPB Inhale 1 puff into the lungs daily. 1 each 11  . warfarin (COUMADIN) 2.5 MG tablet Take 1 tablet (2.5 mg total) by mouth as directed. 30 tablet 3   No current facility-administered medications for this visit.      Physical Exam:   BP 106/67 mmHg  Pulse 81  Resp 16  Ht 5' 1.5" (1.562 m)  Wt 136 lb (61.689 kg)  BMI 25.28 kg/m2  SpO2 96%  General:  Well-appearing  Chest:   Clear to auscultation  CV:   Regular rate and rhythm  Incisions:  Small (3 x 4 mm) open wound in the middle of the patient's old sternotomy scar. This probes to a depth of 4 mm. There is no purulence. There is no surrounding cellulitis. There is no tenderness on palpation.  Abdomen:  Soft and nontender  Extremities:  Warm and well-perfused  Diagnostic Tests:  Swab wound culture obtained 10/13/2015 remains pending at this time   Impression:  Small superficial sternal wound infection.  No clinical signs of deep sternal infection, sternal fracture, or mediastinitis.  Plan:  We will obtain a CT scan of the chest to rule out undrained fluid collection, sternal fracture, or signs of deep sternal infection. Continue oral ciprofloxacin and follow up wound culture. Continue local wound care without change at  this time. The patient will return in 2 weeks for wound check or sooner as needed.  I spent in excess of 10 minutes during the conduct of this office consultation and >50% of this time involved direct face-to-face encounter with the patient for counseling and/or coordination of their care.    Valentina Gu. Roxy Manns, MD 10/16/2015 12:40 PM

## 2015-10-16 NOTE — Telephone Encounter (Signed)
Discussed results of CT scan with patient.  Will not change any plans at this time.  RTC for f/u in 2 weeks.  Rexene Alberts , MD  10/16/2015 6:51 PM

## 2015-10-16 NOTE — Patient Instructions (Addendum)
Continue wound care without change at this time.  Call and return to clinic if you develop fever, chills, sweats, increased drainage from wound, redness surrounding the wound, or other concerning symptoms.

## 2015-10-16 NOTE — Telephone Encounter (Signed)
appt scheduled which is required due to pt's insurance change to requalify for O2. Nothing further needed.

## 2015-10-16 NOTE — Telephone Encounter (Signed)
Patient husband says [facility] needs PA for 02 .  Please call  To discuss.

## 2015-10-23 ENCOUNTER — Encounter: Payer: Self-pay | Admitting: *Deleted

## 2015-10-30 ENCOUNTER — Other Ambulatory Visit (HOSPITAL_COMMUNITY)
Admission: AD | Admit: 2015-10-30 | Discharge: 2015-10-30 | Disposition: A | Discharge status: Home / Self Care | Payer: Medicare Other | Source: Ambulatory Visit | Attending: Thoracic Surgery (Cardiothoracic Vascular Surgery) | Admitting: Thoracic Surgery (Cardiothoracic Vascular Surgery)

## 2015-10-30 ENCOUNTER — Other Ambulatory Visit: Payer: Self-pay | Admitting: Thoracic Surgery (Cardiothoracic Vascular Surgery)

## 2015-10-30 ENCOUNTER — Ambulatory Visit (INDEPENDENT_AMBULATORY_CARE_PROVIDER_SITE_OTHER): Payer: Self-pay | Admitting: Thoracic Surgery (Cardiothoracic Vascular Surgery)

## 2015-10-30 ENCOUNTER — Encounter: Payer: Self-pay | Admitting: Thoracic Surgery (Cardiothoracic Vascular Surgery)

## 2015-10-30 ENCOUNTER — Other Ambulatory Visit: Payer: Self-pay | Admitting: *Deleted

## 2015-10-30 VITALS — BP 96/61 | HR 84 | Resp 16 | Ht 61.5 in | Wt 136.0 lb

## 2015-10-30 DIAGNOSIS — L089 Local infection of the skin and subcutaneous tissue, unspecified: Secondary | ICD-10-CM

## 2015-10-30 DIAGNOSIS — Y838 Other surgical procedures as the cause of abnormal reaction of the patient, or of later complication, without mention of misadventure at the time of the procedure: Secondary | ICD-10-CM | POA: Diagnosis not present

## 2015-10-30 DIAGNOSIS — T814XXA Infection following a procedure, initial encounter: Secondary | ICD-10-CM | POA: Diagnosis present

## 2015-10-30 DIAGNOSIS — Z953 Presence of xenogenic heart valve: Secondary | ICD-10-CM

## 2015-10-30 DIAGNOSIS — T148XXA Other injury of unspecified body region, initial encounter: Principal | ICD-10-CM

## 2015-10-30 DIAGNOSIS — T148 Other injury of unspecified body region: Secondary | ICD-10-CM

## 2015-10-30 LAB — COMPREHENSIVE METABOLIC PANEL
ALK PHOS: 111 U/L (ref 38–126)
ALT: 44 U/L (ref 14–54)
ANION GAP: 13 (ref 5–15)
AST: 42 U/L — ABNORMAL HIGH (ref 15–41)
Albumin: 3.8 g/dL (ref 3.5–5.0)
BILIRUBIN TOTAL: 0.4 mg/dL (ref 0.3–1.2)
BUN: 27 mg/dL — ABNORMAL HIGH (ref 6–20)
CALCIUM: 9.6 mg/dL (ref 8.9–10.3)
CO2: 26 mmol/L (ref 22–32)
CREATININE: 1.01 mg/dL — AB (ref 0.44–1.00)
Chloride: 101 mmol/L (ref 101–111)
GFR, EST NON AFRICAN AMERICAN: 52 mL/min — AB (ref >60)
GLUCOSE: 123 mg/dL — AB (ref 65–99)
POTASSIUM: 5.1 mmol/L (ref 3.5–5.1)
Sodium: 140 mmol/L (ref 135–145)
TOTAL PROTEIN: 7.8 g/dL (ref 6.5–8.1)

## 2015-10-30 LAB — PREALBUMIN: Prealbumin: 29 mg/dL (ref 18–38)

## 2015-10-30 LAB — CBC
HEMATOCRIT: 37.1 % (ref 36.0–46.0)
HEMOGLOBIN: 10.8 g/dL — AB (ref 12.0–15.0)
MCH: 25.2 pg — AB (ref 26.0–34.0)
MCHC: 29.1 g/dL — AB (ref 30.0–36.0)
MCV: 86.5 fL (ref 78.0–100.0)
Platelets: 317 10*3/uL (ref 150–400)
RBC: 4.29 MIL/uL (ref 3.87–5.11)
RDW: 19.1 % — AB (ref 11.5–15.5)
WBC: 10.9 10*3/uL — ABNORMAL HIGH (ref 4.0–10.5)

## 2015-10-30 MED ORDER — CIPROFLOXACIN HCL 500 MG PO TABS: 500.0000 mg | ORAL_TABLET | Freq: Two times a day (BID) | ORAL | Status: DC

## 2015-10-30 NOTE — Progress Notes (Signed)
[address]       [place] [address]             [phone]     CARDIOTHORACIC SURGERY OFFICE NOTE  Referring Provider is Wellington Hampshire, MD PCP is Ezequiel Kayser, MD   HPI:  Patient returns to the office today for wound check. She was last seen in our office on 10/16/2015. She states that since then she has continued to improve clinically. 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 She has not had fevers or chills. She completed her course of oral ciprofloxacin. After she stopped taking ciprofloxacin for wound drainage began to increase again.   She reports mild soreness in her anterior chest that is exacerbated with coughing or increased physical activity. Appetite is good. She has no other complaints.   Current Outpatient Prescriptions  Medication Sig Dispense Refill  . acetaminophen (TYLENOL) 325 MG tablet Take 2 tablets (650 mg total) by mouth every 4 (four) hours as needed for mild pain (or Fever >/= 101).    Marland Kitchen aspirin EC 81 MG EC tablet Take 1 tablet (81 mg total) by mouth daily.    . benzonatate (TESSALON) 100 MG capsule Take 100 mg by mouth 3 (three) times daily as needed for cough (for cold).     . bisoprolol (ZEBETA) 10 MG tablet Take 1 tablet (10 mg total) by mouth daily. 30 tablet 5  . fluticasone (FLONASE) 50 MCG/ACT nasal spray Place 1 spray into both nostrils daily as needed for allergies.     . furosemide (LASIX) 20 MG tablet Take 20 mg by mouth 2 (two) times daily.     Marland Kitchen gabapentin (NEURONTIN) 100 MG capsule Take 100 mg by mouth 2 (two) times daily.   1  . ipratropium-albuterol (DUONEB) 0.5-2.5 (3) MG/3ML SOLN Take 3 mLs by nebulization every 6 (six) hours as needed (for shortness of breath). 360 mL 10  . levothyroxine (SYNTHROID, LEVOTHROID) 88 MCG tablet Take 88 mcg by mouth daily before breakfast.    . losartan (COZAAR) 100 MG tablet Take 1 tablet (100 mg total) by  mouth daily. 30 tablet 3  . pantoprazole (PROTONIX) 40 MG tablet Take 40 mg by mouth 2 (two) times daily.     . potassium chloride SA (K-DUR,KLOR-CON) 20 MEQ tablet Take 1 tablet (20 mEq total) by mouth daily.    . simvastatin (ZOCOR) 20 MG tablet Take 20 mg by mouth daily.    Marland Kitchen spironolactone (ALDACTONE) 25 MG tablet Take 1 tablet (25 mg total) by mouth daily. 30 tablet 6  . warfarin (COUMADIN) 2.5 MG tablet Take 1 tablet (2.5 mg total) by mouth as directed. 30 tablet 3  . ciprofloxacin (CIPRO) 500 MG tablet Take 1 tablet (500 mg total) by mouth 2 (two) times daily. 60 tablet 0   No current facility-administered medications for this visit.      Physical Exam:   BP 96/61 mmHg  Pulse 84  Resp 16  Ht 5' 1.5" (1.562 m)  Wt 136 lb (61.689 kg)  BMI 25.28 kg/m2  SpO2 94%  General:  Well-appearing  Chest:   Clear to auscultation  CV:   Regular rate and rhythm  Incisions:  Small punctate opening wound persists, measuring only 2 mm in diameter. This wound is too small to be probed. On palpation of the sternum and the right sided costal cartilages there is mild tenderness and a small amount  of purulent material can be expressed.  Abdomen:  Soft and nontender  Extremities:  Warm and well-perfused  Diagnostic Tests:  CT CHEST WITHOUT CONTRAST  TECHNIQUE: Multidetector CT imaging of the chest was performed following the standard protocol without IV contrast.  COMPARISON: None.  FINDINGS: Mediastinum/Lymph Nodes: Again noted are the surgical changes of previous median sternotomy and CABG. Soft tissue defect now appreciated over the lower sternum, with mild surrounding edema/phlegmon, without evidence of circumscribed fluid collection within these superficial soft tissues. No fluid collection or significant edema within the retrosternal mediastinum.  The right and left components of the sternum, status post sternotomy, are closely apposed. The median sternotomy wires  appear intact and stable in alignment throughout. At least mild osseous fusion has occurred since the previous CT.  Within the deeper aspects of the right breast, approximately 4 cm lateral to the soft tissue defect overlying the sternum, there is an irregular masslike density which measures 2.7 x 1.6 cm (series 3, images 40 through 43).  Lungs/Pleura: Mild scarring/atelectasis at each lung base. Calcified pleural based granuloma on the right. Emphysematous changes again noted at the lung apices. No acute lung findings.  Upper abdomen: Limited images of the upper abdomen are unremarkable.  Musculoskeletal: As above. Also, mild degenerative changes throughout the mildly scoliotic thoracolumbar spine.  IMPRESSION: 1. Soft tissue defect appreciated over the lower sternum, new compared to the previous CT of 05/08/2015, compatible with the given history of draining wound. Small amount of ill-defined edema/phlegmon on either side of this midline soft tissue defect, overall measuring 1.6 cm greatest thickness, without circumscribed fluid collection or evidence of abscess. 2. No fluid collection or abscess identified within the underlying retrosternal mediastinum. Components of the sternum remain closely opposed. No destructive changes seen within the sternum to suggest associated osteomyelitis. 3. Irregular masslike density within the deeper aspects of the right breast, approximately 4 cm lateral to the midline soft tissue defect, measuring 2.7 x 1.6 cm. This is a new finding compared to the previous CT of 05/08/2015. Interval development of a neoplastic breast mass cannot be excluded. This could represent additional adjacent soft tissue edema/fluid extending from the midline defect. Recommend correlation with diagnostic mammography and right breast ultrasound. 4. Scarring/atelectasis at each lung base. Emphysematous changes at each lung apex. Lungs otherwise unremarkable. No  evidence of pneumonia. No pleural effusion. These results will be called to the ordering clinician or representative by the Radiologist Assistant, and communication documented in the PACS or zVision Dashboard.   Electronically Signed  By: Franki Cabot M.D.  On: 10/16/2015 13:41   Impression:  Patient has persistent superficial sternal wound infection. I suspect that she may have underlying osteomyelitis and/or costochondritis since the drainage has increased now that she has been off of antibiotics.  The "irregular masslike density" noted on chest CT scan corresponds to the area of tenderness, and I suspect that this may be the costal cartilage.  Plan:  We obtained a repeat wound culture today and will make sure that the specimen is sent for routine, fungal, and AFB culture. We will also obtain separate blood cultures, CBC, CMP, and pre-albumen level. I have restarted the patient on oral ciprofloxacin empirically. The patient will return in 3 weeks for follow-up or sooner should problems develop.  I spent in excess of 15 minutes during the conduct of this office consultation and >50% of this time involved direct face-to-face encounter with the patient for counseling and/or coordination of their care.   [name] Roxy Manns,  MD 10/30/2015 1:09 PM

## 2015-10-31 ENCOUNTER — Ambulatory Visit: Payer: Medicare PPO

## 2015-10-31 NOTE — Progress Notes (Signed)
Cardiac Individual Treatment Plan  Patient Details  Name: [name] MRN: [id number] Date of Birth: [birth year] Referring Provider:  Wellington Hampshire, MD  Initial Encounter Date:    Visit Diagnosis: S/P MVR (mitral valve replacement) - Plan: CARDIAC REHAB 30 DAY REVIEW, CARDIAC REHAB 30 DAY REVIEW, CARDIAC REHAB 30 DAY REVIEW, CARDIAC REHAB 30 DAY REVIEW  S/P CABG x 1 - Plan: CARDIAC REHAB 30 DAY REVIEW, CARDIAC REHAB 30 DAY REVIEW, CARDIAC REHAB 30 DAY REVIEW, CARDIAC REHAB 30 DAY REVIEW  Patient's Home Medications on Admission:  Current outpatient prescriptions:  .  acetaminophen (TYLENOL) 325 MG tablet, Take 2 tablets (650 mg total) by mouth every 4 (four) hours as needed for mild pain (or Fever >/= 101)., Disp: , Rfl:  .  aspirin EC 81 MG EC tablet, Take 1 tablet (81 mg total) by mouth daily., Disp: , Rfl:  .  benzonatate (TESSALON) 100 MG capsule, Take 100 mg by mouth 3 (three) times daily as needed for cough (for cold). , Disp: , Rfl:  .  bisoprolol (ZEBETA) 10 MG tablet, Take 1 tablet (10 mg total) by mouth daily., Disp: 30 tablet, Rfl: 5 .  ciprofloxacin (CIPRO) 500 MG tablet, Take 1 tablet (500 mg total) by mouth 2 (two) times daily., Disp: 60 tablet, Rfl: 0 .  fluticasone (FLONASE) 50 MCG/ACT nasal spray, Place 1 spray into both nostrils daily as needed for allergies. , Disp: , Rfl:  .  furosemide (LASIX) 20 MG tablet, Take 20 mg by mouth 2 (two) times daily. , Disp: , Rfl:  .  gabapentin (NEURONTIN) 100 MG capsule, Take 100 mg by mouth 2 (two) times daily. , Disp: , Rfl: 1 .  ipratropium-albuterol (DUONEB) 0.5-2.5 (3) MG/3ML SOLN, Take 3 mLs by nebulization every 6 (six) hours as needed (for shortness of breath)., Disp: 360 mL, Rfl: 10 .  levothyroxine (SYNTHROID, LEVOTHROID) 88 MCG tablet, Take 88 mcg by mouth daily before breakfast., Disp: , Rfl:  .  losartan (COZAAR) 100 MG tablet, Take 1 tablet (100 mg total) by mouth daily., Disp: 30 tablet, Rfl: 3 .  pantoprazole  (PROTONIX) 40 MG tablet, Take 40 mg by mouth 2 (two) times daily. , Disp: , Rfl:  .  potassium chloride SA (K-DUR,KLOR-CON) 20 MEQ tablet, Take 1 tablet (20 mEq total) by mouth daily., Disp: , Rfl:  .  simvastatin (ZOCOR) 20 MG tablet, Take 20 mg by mouth daily., Disp: , Rfl:  .  spironolactone (ALDACTONE) 25 MG tablet, Take 1 tablet (25 mg total) by mouth daily., Disp: 30 tablet, Rfl: 6 .  warfarin (COUMADIN) 2.5 MG tablet, Take 1 tablet (2.5 mg total) by mouth as directed., Disp: 30 tablet, Rfl: 3  Past Medical History: Past Medical History  Diagnosis Date  . Hypertension   . COPD (chronic obstructive pulmonary disease) ([place])   . Hyperlipidemia   . Meniere disease   . HX: breast cancer   . PAF (paroxysmal atrial fibrillation) ([place])   . COPD (chronic obstructive pulmonary disease) ([place])   . Hypothyroidism   . Breast cancer ([place]) 08/09/2001    right/rad  . Pulmonary hypertension ([place])   . Chronic diastolic CHF (congestive heart failure) ([place])   . PONV (postoperative nausea and vomiting)   . Anxiety   . GERD (gastroesophageal reflux disease)   . Arthritis   . Severe Mitral Regurgitation s/p MV Repair     a. 04/2015 s/p 27 mm [facility] Mitral bovine bioprosthetic tissue valve  . Tricuspid Regurgitation s/p  Repair     a. 04/2015 s/p 26 mm Edwards mc3 ring annuloplasty  . 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 04/2015 Complete bilateral atrial lesion set using cryothermy and bipolar radiofrequency ablation with clipping of LA appendage  . CAD s/p CABG x 1     a. 04/2015 LIMA to LAD  . Post-surgical complete heart block, symptomatic     a. 04/2015 s/p MDT [id number] Advisa DR MRI DC PPM.  . Wound infection ([place]) 10/13/2015    Superficial sternal wound infection    Tobacco Use: History  Smoking status  . Former Smoker -- 41 years  . Types: Cigarettes  . Quit date: 04/20/1998  Smokeless tobacco  . Not on file    Labs: Recent Review Flowsheet Data    Labs for ITP Cardiac and  Pulmonary Rehab Latest Ref Rng 04/26/2015 04/27/2015 04/28/2015 04/29/2015 04/30/2015   TCO2 0 - 100 mmol/L 23 - - 34 34   O2SAT - - 68.5 65.3 61.7 -       Exercise Target Goals:    Exercise Program Goal: Individual exercise prescription set with THRR, safety & activity barriers. Participant demonstrates ability to understand and report RPE using BORG scale, to self-measure pulse accurately, and to acknowledge the importance of the exercise prescription.  Exercise Prescription Goal: Starting with aerobic activity 30 plus minutes a day, 3 days per week for initial exercise prescription. Provide home exercise prescription and guidelines that participant acknowledges understanding prior to discharge.  Activity Barriers & Risk Stratification:     Activity Barriers & Cardiac Risk Stratification - 07/03/15 1335    Activity Barriers & Cardiac Risk Stratification   Activity Barriers Balance Concerns;Deconditioning;Joint Problems  Meineres disease= balance concerns, bursitis left hip and leg, sternal open wound   Cardiac Risk Stratification Moderate      6 Minute Walk:     6 Minute Walk      07/03/15 1424       6 Minute Walk   Phase Initial     Distance 450 feet  NS Test: 450 steps     Walk Time 6 minutes     RPE 11     Symptoms No     Resting HR 69 bpm     Resting BP 132/80 mmHg     Max Ex. HR 84 bpm     Max Ex. BP 138/74 mmHg        Initial Exercise Prescription:     Initial Exercise Prescription - 07/03/15 1400    Date of Initial Exercise Prescription   Date 07/03/15   Bike   Level 0.5   Minutes 15   Recumbant Bike   Level 2   RPM 30   Watts 20   Minutes 15   NuStep   Level 2   Watts 30   Minutes 15   Cybex   Level 1   RPM 30   Minutes 15   Recumbant Elliptical   Level 1   Watts 20   Minutes 15   REL-XR   Level 2   Watts 30   Minutes 15   Prescription Details   Frequency (times per week) 3   Duration Progress to 30 minutes of continuous aerobic  without signs/symptoms of physical distress   Intensity   THRR REST +  30   Progression Continue progressive overload as per policy without signs/symptoms or physical distress.  no upper body exercise due to sternal wound precautions  Resistance Training   Training Prescription No  ROM only due to sternal wound precautions      Exercise Prescription Changes:     Exercise Prescription Changes      07/11/15 0600 07/21/15 0900 07/26/15 0800 08/07/15 1500 08/09/15 0750   Exercise Review   Progression No  Has not started program yet; med review was 07/03/15 No  Has not started program yet; med review was 07/03/15 Yes  Has not started program yet; med review was 07/03/15 Yes No   Response to Exercise   Blood Pressure (Admit)    108/62 mmHg 116/64 mmHg   Blood Pressure (Exercise)     118/64 mmHg   Blood Pressure (Exit)    112/70 mmHg 108/56 mmHg   Heart Rate (Admit)    76 bpm 76 bpm   Heart Rate (Exercise)    101 bpm 105 bpm   Heart Rate (Exit)    71 bpm 73 bpm   Rating of Perceived Exertion (Exercise)    13 13   Symptoms    None None   Comments    Esteen is getting stronger and is especially encouraged on the treadmill. She can now walk for 20 minutes without stopping. She can walk into exercise class without oxygen now too and she was very proud of that.  Laquisha has been out for almost a month and will continue to be out due to illness and medical reasons. No more exercise progression will be made and we will meet with her individually to adjust workloads based on her exercise capacity when she returns    Duration    Progress to 50 minutes of aerobic without signs/symptoms of physical distress Progress to 50 minutes of aerobic without signs/symptoms of physical distress   Intensity    Rest + 30 Rest + 30   Progression    Continue progressive overload as per policy without signs/symptoms or physical distress. Continue progressive overload as per policy without signs/symptoms or physical  distress.   Resistance Training   Training Prescription   Yes Yes Yes   Weight   _0 Reps   10-15 10-15 10-15   Treadmill   MPH    2 2   Grade    0 0   Minutes    20 20   REL-XR   Level  _1 Watts  45 45 45 45   Minutes    15 15     09/26/15 0800 10/23/15 1100         Exercise Review   Progression No  Absent since last review; last visit 08/09/15 No  Absent since last review; last visit 08/09/15      Response to Exercise   Comments Ex. Rx. will be re evaluated upon return due to extended absence Ex. Rx. will be re evaluated upon return due to extended absence      Duration Progress to 50 minutes of aerobic without signs/symptoms of physical distress Progress to 50 minutes of aerobic without signs/symptoms of physical distress      Intensity Rest + 30 Rest + 30      Progression Continue progressive overload as per policy without signs/symptoms or physical distress. Continue progressive overload as per policy without signs/symptoms or physical distress.      Resistance Training   Training Prescription Yes Yes      Weight 2 2      Reps 10-15 10-15  Treadmill   MPH 2 2      Grade 0 0      Minutes 20 20      REL-XR   Level 5 5      Watts 45 45      Minutes 15 15         Discharge Exercise Prescription (Final Exercise Prescription Changes):     Exercise Prescription Changes - 10/23/15 1100    Exercise Review   Progression No  Absent since last review; last visit 08/09/15   Response to Exercise   Comments Ex. Rx. will be re evaluated upon return due to extended absence   Duration Progress to 50 minutes of aerobic without signs/symptoms of physical distress   Intensity Rest + 30   Progression Continue progressive overload as per policy without signs/symptoms or physical distress.   Resistance Training   Training Prescription Yes   Weight 2   Reps 10-15   Treadmill   MPH 2   Grade 0   Minutes 20   REL-XR   Level 5   Watts 45   Minutes 15       Nutrition:  Target Goals: Understanding of nutrition guidelines, daily intake of sodium <1530m, cholesterol <2013m calories 30% from fat and 7% or less from saturated fats, daily to have 5 or more servings of fruits and vegetables.  Biometrics:     Pre Biometrics - 07/03/15 1431    Pre Biometrics   Height _0  (1.549 m)   Weight 136 lb 8 oz (61.916 kg)   Waist Circumference 34.5 inches   Hip Circumference 38.5 inches   Waist to Hip Ratio 0.9 %   BMI (Calculated) 25.8       Nutrition Therapy Plan and Nutrition Goals:   Nutrition Discharge: Rate Your Plate Scores:   Nutrition Goals Re-Evaluation:   Psychosocial: Target Goals: Acknowledge presence or absence of depression, maximize coping skills, provide positive support system. Participant is able to verbalize types and ability to use techniques and skills needed for reducing stress and depression.  Initial Review & Psychosocial Screening:     Initial Psych Review & Screening - 07/03/15 1342    Family Dynamics   Good Support System? Yes   Barriers   Psychosocial barriers to participate in program There are no identifiable barriers or psychosocial needs.;The patient should benefit from training in stress management and relaxation.   Screening Interventions   Interventions Encouraged to exercise      Quality of Life Scores:     Quality of Life - 07/03/15 1446    Quality of Life Scores   Health/Function Pre 22.62 %   Socioeconomic Pre 29.58 %   Psych/Spiritual Pre 30 %   Family Pre 30 %   GLOBAL Pre 26.6 %      PHQ-9:     Recent Review Flowsheet Data    [facility]   Decreased Interest 0   Down, Depressed, Hopeless 0   PHQ - 2 Score 0   Altered sleeping 0   Tired, decreased energy 3   Change in appetite 0   Feeling bad or failure about yourself  0   Trouble concentrating 0   Moving slowly or fidgety/restless 0   Suicidal thoughts 0   PHQ-9 Score 3   Difficult doing  work/chores Somewhat difficult      Psychosocial Evaluation and Intervention:     Psychosocial Evaluation - 07/12/15 0936    Psychosocial Evaluation & Interventions  Interventions Stress management education;Encouraged to exercise with the program and follow exercise prescription;Relaxation education   Comments Counselor met with Ms. Chaudoin today for initial psychosocial evaluation.  It is her first day in Cardiac Rehab and she was energetic and working hard already!  Ms. Macinnes is a 81 year old who had open heart surgery in August followed by subsequent procedures and an infection resulting in 5 weeks in the hospital.  She has a strong support system with a spouse of 31 years and several siblings who live closeby.  Ms. Freas reports that she sleeps well and typically has a good appetite.  She denies a history  of depression or anxiety or any current symptoms.  She states her mood is generally positive and her doctor has released her to return to "normal activities" with pacing herself.  Ms. Cheek has goals in this program to increase her strength and stamina and to improve her balance - as she occasionally has to use a walker.  She will benefit from the consistent exercise aspect of this program.     Continued Psychosocial Services Needed No      Psychosocial Re-Evaluation:     Psychosocial Re-Evaluation      07/24/15 0940           Psychosocial Re-Evaluation   Comments Counselor follow up with Ms. Lamoreaux today reporting she has better balance now and is no longer using her walker at home at all.  She brings it to this program just to carry her oxygen tank.  She states she is experiencing more stamina as well and able to walk and move more and for longer periods of time.  She continues to sleep well, eat well and have a positive attitude.  Counselor commended Ms. Iams for all her hard work and progress made so far.            Vocational Rehabilitation: Provide vocational rehab  assistance to qualifying candidates.   Vocational Rehab Evaluation & Intervention:     Vocational Rehab - 07/03/15 1337    Initial Vocational Rehab Evaluation & Intervention   Assessment shows need for Vocational Rehabilitation No      Education: Education Goals: Education classes will be provided on a weekly basis, covering required topics. Participant will state understanding/return demonstration of topics presented.  Learning Barriers/Preferences:     Learning Barriers/Preferences - 07/03/15 1336    Learning Barriers/Preferences   Learning Barriers Hearing   Learning Preferences Video      Education Topics: General Nutrition Guidelines/Fats and Fiber: -Group instruction provided by verbal, written material, models and posters to present the general guidelines for heart healthy nutrition. Gives an explanation and review of dietary fats and fiber.   Controlling Sodium/Reading Food Labels: -Group verbal and written material supporting the discussion of sodium use in heart healthy nutrition. Review and explanation with models, verbal and written materials for utilization of the food label.   Exercise Physiology & Risk Factors: - Group verbal and written instruction with models to review the exercise physiology of the cardiovascular system and associated critical values. Details cardiovascular disease risk factors and the goals associated with each risk factor.          Cardiac Rehab from 08/09/2015 in [facility] Cardiac and Pulmonary Rehab   Date  07/12/15   Educator  RM   Instruction Review Code  2- meets goals/outcomes      Aerobic Exercise & Resistance Training: - Gives group verbal and written discussion on the health  impact of inactivity. On the components of aerobic and resistive training programs and the benefits of this training and how to safely progress through these programs.      Cardiac Rehab from 08/09/2015 in [facility] Cardiac and Pulmonary Rehab   Date  07/17/15    Educator  RM   Instruction Review Code  2- meets goals/outcomes      Flexibility, Balance, General Exercise Guidelines: - Provides group verbal and written instruction on the benefits of flexibility and balance training programs. Provides general exercise guidelines with specific guidelines to those with heart or lung disease. Demonstration and skill practice provided.      Cardiac Rehab from 08/09/2015 in [facility] Cardiac and Pulmonary Rehab   Date  07/24/15   Educator  RM   Instruction Review Code  2- meets goals/outcomes      Stress Management: - Provides group verbal and written instruction about the health risks of elevated stress, cause of high stress, and healthy ways to reduce stress.      Cardiac Rehab from 08/09/2015 in [facility] Cardiac and Pulmonary Rehab   Date  07/26/15   Educator  [facility]   Instruction Review Code  2- meets goals/outcomes      Depression: - Provides group verbal and written instruction on the correlation between heart/lung disease and depressed mood, treatment options, and the stigmas associated with seeking treatment.   Anatomy & Physiology of the Heart: - Group verbal and written instruction and models provide basic cardiac anatomy and physiology, with the coronary electrical and arterial systems. Review of: AMI, Angina, Valve disease, Heart Failure, Cardiac Arrhythmia, Pacemakers, and the ICD.   Cardiac Procedures: - Group verbal and written instruction and models to describe the testing methods done to diagnose heart disease. Reviews the outcomes of the test results. Describes the treatment choices: Medical Management, Angioplasty, or Coronary Bypass Surgery.   Cardiac Medications: - Group verbal and written instruction to review commonly prescribed medications for heart disease. Reviews the medication, class of the drug, and side effects. Includes the steps to properly store meds and maintain the prescription regimen.   Go Sex-Intimacy & Heart Disease,  Get SMART - Goal Setting: - Group verbal and written instruction through game format to discuss heart disease and the return to sexual intimacy. Provides group verbal and written material to discuss and apply goal setting through the application of the S.M.A.R.T. Method.   Other Matters of the Heart: - Provides group verbal, written materials and models to describe Heart Failure, Angina, Valve Disease, and Diabetes in the realm of heart disease. Includes description of the disease process and treatment options available to the cardiac patient.      Cardiac Rehab from 08/09/2015 in [facility] Cardiac and Pulmonary Rehab   Date  08/09/15   Educator  SB   Instruction Review Code  2- meets goals/outcomes      Exercise & Equipment Safety: - Individual verbal instruction and demonstration of equipment use and safety with use of the equipment.      Cardiac Rehab from 08/09/2015 in [facility] Cardiac and Pulmonary Rehab   Date  07/03/15   Educator  SB   Instruction Review Code  2- meets goals/outcomes      Infection Prevention: - Provides verbal and written material to individual with discussion of infection control including proper hand washing and proper equipment cleaning during exercise session.      Cardiac Rehab from 08/09/2015 in [facility] Cardiac and Pulmonary Rehab   Date  07/03/15  Educator  Sb   Instruction Review Code  2- meets goals/outcomes      Falls Prevention: - Provides verbal and written material to individual with discussion of falls prevention and safety.      Cardiac Rehab from 08/09/2015 in [facility] Cardiac and Pulmonary Rehab   Date  07/03/15   Educator  Sb   Instruction Review Code  2- meets goals/outcomes      Diabetes: - Individual verbal and written instruction to review signs/symptoms of diabetes, desired ranges of glucose level fasting, after meals and with exercise. Advice that pre and post exercise glucose checks will be done for 3 sessions at entry of  program.    Knowledge Questionnaire Score:     Knowledge Questionnaire Score - 07/03/15 1440    Knowledge Questionnaire Score   Pre Score 26/28      Personal Goals and Risk Factors at Admission:     Personal Goals and Risk Factors at Admission - 07/03/15 1339    Personal Goals and Risk Factors on Admission   Increase Aerobic Exercise and Physical Activity Yes;Sedentary  Balance concerns ,using walker for ambulation.   Intervention While in program, learn and follow the exercise prescription taught. Start at a low level workload and increase workload after able to maintain previous level for 30 minutes. Increase time before increasing intensity.   Intervention Provide exercise education and an individualized exercise prescription that will provide continued progressive overload as per policy without signs/symptoms of physical distress.   Diabetes No   Hypertension Yes   Goal Participant will see blood pressure controlled within the values of 140/45m/Hg or within value directed by their physician.   Intervention Provide nutrition & aerobic exercise along with prescribed medications to achieve BP 140/90 or less.   Lipids Yes   Goal Cholesterol controlled with medications as prescribed, with individualized exercise RX and with personalized nutrition plan. Value goals: LDL < 765m HDL > 4031mParticipant states understanding of desired cholesterol values and following prescriptions.   Intervention Provide nutrition & aerobic exercise along with prescribed medications to achieve LDL <76m60mDL >40mg80m   Personal Goals and Risk Factors Review:      Goals and Risk Factor Review      07/17/15 1007 08/02/15 1511 09/26/15 0826 10/23/15 1108     Increase Aerobic Exercise and Physical Activity   Goals Progress/Improvement seen   Yes No No    Comments Patient discussed her desire to get back to "normal" and was talking about some of the exercise she used to do before he heart episode.  She said that after her first week of class she was a little sore. We discussed that that can be a normal response of the body when a new activity is preformed, and how over time the soreness could go away once he muscles get stronger and used to the new activity. Education on cross training/ the body's response to new activities was given.  Shandora Leontinaed today she can bend down and tie her shoes.  Today she was having a flare of her bursitis in her leg. No treadmill time because of the bursitis pain.  She is walking more at home and her husband bought her a cane to help her stay balanced. We talked about the distance it is to walk from the hosptal entrance to the rehab gym. Her husband wheels her down. Her goal is to be able to walk from the class to the front door without wheelchair assistance  by Dec 19th.  Ex. Rx. will be re-evaluated upon return due to extende absence Ex. Rx. will be re-evaluated upon return due to extende absence    Hypertension   Progress seen toward goals  Yes      Comments  Cataleah's BP remains in normal range. She will see RD in Dec on the 19th to review nutrition plan.       Abnormal Lipids   Progress seen towards goals  Unknown      Comments  No labs to review. Delsy will have an appointment with the RD in December.          Personal Goals Discharge (Final Personal Goals and Risk Factors Review):      Goals and Risk Factor Review - 10/23/15 1108    Increase Aerobic Exercise and Physical Activity   Goals Progress/Improvement seen  No   Comments Ex. Rx. will be re-evaluated upon return due to extende absence      ITP Comments:     ITP Comments      08/14/15 1211 09/10/15 1215 10/05/15 1131 10/31/15 0751     ITP Comments 30 day review preparation: Continue with ITP. Ready for 30 day review.  Continue with ITP  Remains out with medical concerns.  Has appoinment mid JAn with cardiologist regarding pacemaker qustions. Ready for 30 day review. Continue with ITP. Remains out  with medical reasons 30 day review.  Continue with ITP. Continues to remain out for medical reasons       Comments:

## 2015-11-01 ENCOUNTER — Ambulatory Visit (INDEPENDENT_AMBULATORY_CARE_PROVIDER_SITE_OTHER): Payer: Medicare Other | Admitting: *Deleted

## 2015-11-01 DIAGNOSIS — I4891 Unspecified atrial fibrillation: Secondary | ICD-10-CM

## 2015-11-01 DIAGNOSIS — I340 Nonrheumatic mitral (valve) insufficiency: Secondary | ICD-10-CM

## 2015-11-01 DIAGNOSIS — Z953 Presence of xenogenic heart valve: Secondary | ICD-10-CM | POA: Diagnosis not present

## 2015-11-01 LAB — POCT INR: INR: 1.4

## 2015-11-02 LAB — CULTURE, ROUTINE-ABSCESS
GRAM STAIN: NONE SEEN
Gram Stain: NONE SEEN
Organism ID, Bacteria: NORMAL

## 2015-11-04 LAB — CULTURE, BLOOD (ROUTINE X 2)
CULTURE: NO GROWTH
Culture: NO GROWTH

## 2015-11-06 ENCOUNTER — Telehealth: Payer: Self-pay | Admitting: *Deleted

## 2015-11-06 NOTE — Telephone Encounter (Signed)
I called and spoke to [place].She wants to return to Cardiac Rehab. She is wondering what she needs to do to return. I told her I would double check with Foster Simpson, R.N. Allan reports that her doctor cleared her to return even though she still has an infection.

## 2015-11-07 ENCOUNTER — Ambulatory Visit (INDEPENDENT_AMBULATORY_CARE_PROVIDER_SITE_OTHER): Payer: Medicare Other

## 2015-11-07 ENCOUNTER — Encounter: Payer: Self-pay | Admitting: Pulmonary Disease

## 2015-11-07 ENCOUNTER — Encounter: Payer: Self-pay | Admitting: Cardiovascular Disease

## 2015-11-07 ENCOUNTER — Ambulatory Visit (INDEPENDENT_AMBULATORY_CARE_PROVIDER_SITE_OTHER): Payer: Medicare Other | Admitting: Pulmonary Disease

## 2015-11-07 VITALS — BP 118/74 | HR 76 | Ht 61.5 in | Wt 135.0 lb

## 2015-11-07 DIAGNOSIS — R0902 Hypoxemia: Secondary | ICD-10-CM | POA: Diagnosis not present

## 2015-11-07 DIAGNOSIS — I340 Nonrheumatic mitral (valve) insufficiency: Secondary | ICD-10-CM | POA: Diagnosis not present

## 2015-11-07 DIAGNOSIS — I272 Other secondary pulmonary hypertension: Secondary | ICD-10-CM | POA: Diagnosis not present

## 2015-11-07 DIAGNOSIS — Z953 Presence of xenogenic heart valve: Secondary | ICD-10-CM | POA: Diagnosis not present

## 2015-11-07 DIAGNOSIS — I4891 Unspecified atrial fibrillation: Secondary | ICD-10-CM

## 2015-11-07 LAB — POCT INR: INR: 1.6

## 2015-11-07 NOTE — Telephone Encounter (Signed)
This encounter was created in error - please disregard.

## 2015-11-08 NOTE — Progress Notes (Signed)
PULMONARY OFFICE FOLLOW UP NOTE  Date of initial consultation: 07/27/15 Reason for consultation: COPD  Initial HPI:  86 F here to establish care for COPD. Previously followed by Dr Raul Del. She was first diagnosed with COPD approx 4 yrs ago at the time of a pneumonia (was not hospitalized). She has been treated with bronchodilator therapy. She has been on oxygen therapy since 12/15. Notably, at that time, she was suffering from CHF. She has since undergone Maze MVR and single vessel CABG. Her post op course was complicated by a wound infection. She has now recovered fully from that and is participating in cardiac rehab program. She is presently limited by fatigue and deconditioning more than by dyspnea. She walks slowly on a treadmill for 20 mins @ cardiac rehab. She can complete a flight of stairs. She has minimal cough and very infrequent sputum production.  She has been tried on [facility] which caused hoarseness. She was changed to a LABA which was not discernibly beneficial. She almost never uses her rescue inhaler. She also has a nebulizer @ home with Duoneb but she has not used this in months. She denies fever, hemoptysis, LE edema. She is up to date on flu and pneumococcal vaccines. Initial Impression (07/26/25): Chronic obstructive pulmonary disease, unspecified COPD type. Hypoxemia - resolved. Might have been due to CHF. Initial Data: 05/08/15 CT chest: 1. Expected postoperative changes associated with recent CABG procedure. 2. No subcutaneous fluid collections identified overlying the sternotomy defect. The components of the sternotomy defect appear well approximated and there is no evidence for sternal wire fracture. 3. A small low-attenuation fluid collection is identified within the anterior mediastinum/retrosternal space. This is nonspecific finding in the early postoperative time frame. 4. Small pleural effusions. 5. Prior granulomatous disease.  05/22/15 CXR: Hyperinflation. NAD  03/17/15 PFTs:  mild-mod obstruction, normal lung volumes, severe decrease DLCO.  Initial Plan (07/27/15): Trial of Incruse - once inhalation daily. Change O2 to nocturnal and PRN during day.   Subsequent data: CXR 08/17/15: No acute chest findings. Chronic postoperative changes as described. TTE 08/22/15: LVEF 30-35%. Diffuse HK. prosthetic valves functioning well   SUBJ: Respiratory status is markedly improved since MVR. Not wearing O2 at all during daytime. Still uses @ night. Initiatially felt that Incruse was mildly beneficial but is no longer using it and can't tell any difference since stopping. No new complaints. Raises query whether she still requires nocturnal O2. Being treated for minor sternal wound infection by Dr Milagros Reap Vitals:   11/07/15 1016  BP: 118/74  Pulse: 76  Height: 5' 1.5" (1.562 m)  Weight: 135 lb (61.236 kg)  SpO2: 94%   EXAM:  Gen: WDWN in NAD HEENT: All WNL Neck: NO LAN, no JVD noted Lungs: slightly diminshed BS, no wheezes, rales Cardiovascular: Reg, no M noted Abdomen: Soft, NT +BS Ext: no C/C/E Neuro: CNs intact, motor/sens grossly intact  BMP Latest Ref Rng 10/30/2015 10/03/2015 09/06/2015  Glucose 65 - 99 mg/dL 123(H) 119(H) 291(H)  BUN 6 - 20 mg/dL 27(H) 22 20  Creatinine 0.44 - 1.00 mg/dL 1.01(H) 0.97 0.91  BUN/Creat Ratio 11 - 26 - 23 -  Sodium 135 - 145 mmol/L 140 139 136  Potassium 3.5 - 5.1 mmol/L 5.1 5.0 4.5  Chloride 101 - 111 mmol/L 101 95(L) 101  CO2 22 - 32 mmol/L 26 25 28   Calcium 8.9 - 10.3 mg/dL 9.6 9.6 9.2    CBC Latest Ref Rng 10/30/2015 10/03/2015 06/05/2015  WBC 4.0 - 10.5  K/uL 10.9(H) WILL FOLLOW 11.7(H)  Hemoglobin 12.0 - 15.0 g/dL 10.8(L) - 8.9(L)  Hematocrit 36.0 - 46.0 % 37.1 WILL FOLLOW 29.2(L)  Platelets 150 - 400 K/uL 317 WILL FOLLOW 294    CXR: NNF  IMPRESSION: COPD - very mild. No discernible benefit from Incruse. Minimal dyspnea Pulmonary hypertension- Group 2 - PH due to left heart disease (mitral valve  disease) Hypoxemia - possibly resolved since MVR surgery  PLAN: Overnight oximetry Cont nocturnal O2 for now until overnight oximetry completed and reviewed by me OK to remain off Incruse ROV PRN  Merton Border, MD PCCM service Mobile [phone] Pager [phone]

## 2015-11-08 NOTE — Addendum Note (Signed)
Addended by: Lynford Humphrey on: 11/08/2015 07:09 AM   Modules accepted: Orders

## 2015-11-09 ENCOUNTER — Other Ambulatory Visit: Payer: Self-pay | Admitting: Cardiovascular Disease

## 2015-11-09 NOTE — Telephone Encounter (Signed)
Please review for refill, Thank you. 

## 2015-11-10 ENCOUNTER — Inpatient Hospital Stay (HOSPITAL_COMMUNITY)
Admission: EM | Admit: 2015-11-10 | Discharge: 2015-11-27 | DRG: 857 | Disposition: A | Discharge status: Home / Self Care | Payer: Medicare Other | Attending: Thoracic Surgery (Cardiothoracic Vascular Surgery) | Admitting: Thoracic Surgery (Cardiothoracic Vascular Surgery)

## 2015-11-10 ENCOUNTER — Encounter (HOSPITAL_COMMUNITY): Payer: Self-pay | Admitting: Emergency Medicine

## 2015-11-10 ENCOUNTER — Inpatient Hospital Stay (HOSPITAL_COMMUNITY): Payer: Medicare Other

## 2015-11-10 DIAGNOSIS — E039 Hypothyroidism, unspecified: Secondary | ICD-10-CM | POA: Diagnosis present

## 2015-11-10 DIAGNOSIS — K5900 Constipation, unspecified: Secondary | ICD-10-CM | POA: Diagnosis not present

## 2015-11-10 DIAGNOSIS — Z953 Presence of xenogenic heart valve: Secondary | ICD-10-CM

## 2015-11-10 DIAGNOSIS — M1990 Unspecified osteoarthritis, unspecified site: Secondary | ICD-10-CM | POA: Diagnosis present

## 2015-11-10 DIAGNOSIS — Z7901 Long term (current) use of anticoagulants: Secondary | ICD-10-CM

## 2015-11-10 DIAGNOSIS — Z951 Presence of aortocoronary bypass graft: Secondary | ICD-10-CM

## 2015-11-10 DIAGNOSIS — J449 Chronic obstructive pulmonary disease, unspecified: Secondary | ICD-10-CM | POA: Diagnosis present

## 2015-11-10 DIAGNOSIS — Z7982 Long term (current) use of aspirin: Secondary | ICD-10-CM

## 2015-11-10 DIAGNOSIS — H8109 Meniere's disease, unspecified ear: Secondary | ICD-10-CM | POA: Diagnosis present

## 2015-11-10 DIAGNOSIS — E785 Hyperlipidemia, unspecified: Secondary | ICD-10-CM | POA: Diagnosis present

## 2015-11-10 DIAGNOSIS — J939 Pneumothorax, unspecified: Secondary | ICD-10-CM

## 2015-11-10 DIAGNOSIS — Z9621 Cochlear implant status: Secondary | ICD-10-CM | POA: Diagnosis present

## 2015-11-10 DIAGNOSIS — Z853 Personal history of malignant neoplasm of breast: Secondary | ICD-10-CM | POA: Diagnosis not present

## 2015-11-10 DIAGNOSIS — I272 Other secondary pulmonary hypertension: Secondary | ICD-10-CM | POA: Diagnosis not present

## 2015-11-10 DIAGNOSIS — I5042 Chronic combined systolic (congestive) and diastolic (congestive) heart failure: Secondary | ICD-10-CM | POA: Diagnosis present

## 2015-11-10 DIAGNOSIS — T814XXD Infection following a procedure, subsequent encounter: Secondary | ICD-10-CM | POA: Diagnosis not present

## 2015-11-10 DIAGNOSIS — K219 Gastro-esophageal reflux disease without esophagitis: Secondary | ICD-10-CM | POA: Diagnosis present

## 2015-11-10 DIAGNOSIS — I2510 Atherosclerotic heart disease of native coronary artery without angina pectoris: Secondary | ICD-10-CM | POA: Diagnosis present

## 2015-11-10 DIAGNOSIS — E871 Hypo-osmolality and hyponatremia: Secondary | ICD-10-CM | POA: Diagnosis not present

## 2015-11-10 DIAGNOSIS — I110 Hypertensive heart disease with heart failure: Secondary | ICD-10-CM | POA: Diagnosis not present

## 2015-11-10 DIAGNOSIS — L598 Other specified disorders of the skin and subcutaneous tissue related to radiation: Secondary | ICD-10-CM | POA: Diagnosis present

## 2015-11-10 DIAGNOSIS — Z87891 Personal history of nicotine dependence: Secondary | ICD-10-CM | POA: Diagnosis not present

## 2015-11-10 DIAGNOSIS — L0889 Other specified local infections of the skin and subcutaneous tissue: Secondary | ICD-10-CM | POA: Diagnosis not present

## 2015-11-10 DIAGNOSIS — I480 Paroxysmal atrial fibrillation: Secondary | ICD-10-CM | POA: Diagnosis present

## 2015-11-10 DIAGNOSIS — T814XXA Infection following a procedure, initial encounter: Principal | ICD-10-CM | POA: Diagnosis present

## 2015-11-10 DIAGNOSIS — D62 Acute posthemorrhagic anemia: Secondary | ICD-10-CM | POA: Diagnosis not present

## 2015-11-10 DIAGNOSIS — Y838 Other surgical procedures as the cause of abnormal reaction of the patient, or of later complication, without mention of misadventure at the time of the procedure: Secondary | ICD-10-CM | POA: Diagnosis not present

## 2015-11-10 DIAGNOSIS — L089 Local infection of the skin and subcutaneous tissue, unspecified: Secondary | ICD-10-CM

## 2015-11-10 DIAGNOSIS — B9689 Other specified bacterial agents as the cause of diseases classified elsewhere: Secondary | ICD-10-CM | POA: Diagnosis not present

## 2015-11-10 DIAGNOSIS — T8189XA Other complications of procedures, not elsewhere classified, initial encounter: Secondary | ICD-10-CM

## 2015-11-10 DIAGNOSIS — S21101A Unspecified open wound of right front wall of thorax without penetration into thoracic cavity, initial encounter: Secondary | ICD-10-CM

## 2015-11-10 DIAGNOSIS — T8149XA Infection following a procedure, other surgical site, initial encounter: Secondary | ICD-10-CM

## 2015-11-10 HISTORY — DX: Dependence on supplemental oxygen: Z99.81

## 2015-11-10 HISTORY — DX: Other complications of procedures, not elsewhere classified, initial encounter: T81.89XA

## 2015-11-10 HISTORY — DX: Pneumonia, unspecified organism: J18.9

## 2015-11-10 LAB — CBC WITH DIFFERENTIAL/PLATELET
BASOS ABS: 0 10*3/uL (ref 0.0–0.1)
Basophils Relative: 0 %
Eosinophils Absolute: 0.4 10*3/uL (ref 0.0–0.7)
Eosinophils Relative: 4 %
HEMATOCRIT: 33.5 % — AB (ref 36.0–46.0)
HEMOGLOBIN: 10.5 g/dL — AB (ref 12.0–15.0)
LYMPHS PCT: 9 %
Lymphs Abs: 0.9 10*3/uL (ref 0.7–4.0)
MCH: 27 pg (ref 26.0–34.0)
MCHC: 31.3 g/dL (ref 30.0–36.0)
MCV: 86.1 fL (ref 78.0–100.0)
Monocytes Absolute: 0.9 10*3/uL (ref 0.1–1.0)
Monocytes Relative: 9 %
NEUTROS ABS: 7.2 10*3/uL (ref 1.7–7.7)
Neutrophils Relative %: 78 %
Platelets: 186 10*3/uL (ref 150–400)
RBC: 3.89 MIL/uL (ref 3.87–5.11)
RDW: 20 % — ABNORMAL HIGH (ref 11.5–15.5)
WBC: 9.3 10*3/uL (ref 4.0–10.5)

## 2015-11-10 LAB — SURGICAL PCR SCREEN
MRSA, PCR: NEGATIVE
STAPHYLOCOCCUS AUREUS: NEGATIVE

## 2015-11-10 LAB — PROTIME-INR
INR: 1.74 — AB (ref 0.00–1.49)
Prothrombin Time: 20.3 seconds — ABNORMAL HIGH (ref 11.6–15.2)

## 2015-11-10 LAB — BASIC METABOLIC PANEL
Anion gap: 10 (ref 5–15)
BUN: 17 mg/dL (ref 6–20)
CHLORIDE: 104 mmol/L (ref 101–111)
CO2: 26 mmol/L (ref 22–32)
Calcium: 9.5 mg/dL (ref 8.9–10.3)
Creatinine, Ser: 0.79 mg/dL (ref 0.44–1.00)
GFR calc Af Amer: >60 mL/min (ref >60)
GLUCOSE: 113 mg/dL — AB (ref 65–99)
POTASSIUM: 4.2 mmol/L (ref 3.5–5.1)
Sodium: 140 mmol/L (ref 135–145)

## 2015-11-10 LAB — PREALBUMIN: Prealbumin: 20.1 mg/dL (ref 18–38)

## 2015-11-10 LAB — APTT: APTT: 43 s — AB (ref 24–37)

## 2015-11-10 MED ORDER — DOCUSATE SODIUM 100 MG PO CAPS
100.0000 mg | ORAL_CAPSULE | Freq: Two times a day (BID) | ORAL | Status: DC
  Administered 2015-11-10 – 2015-11-26 (×30): 100 mg via ORAL
  Filled 2015-11-10 (×35): qty 1

## 2015-11-10 MED ORDER — WARFARIN SODIUM 2.5 MG PO TABS: 2.5000 mg | ORAL_TABLET | ORAL | Status: DC

## 2015-11-10 MED ORDER — BENZONATATE 100 MG PO CAPS: 100.0000 mg | ORAL_CAPSULE | Freq: Three times a day (TID) | ORAL | Status: DC | PRN

## 2015-11-10 MED ORDER — SODIUM CHLORIDE 0.9% FLUSH
3.0000 mL | Freq: Two times a day (BID) | INTRAVENOUS | Status: DC
  Administered 2015-11-10 – 2015-11-25 (×7): 3 mL via INTRAVENOUS

## 2015-11-10 MED ORDER — SODIUM CHLORIDE 0.9% FLUSH: 3.0000 mL | INTRAVENOUS | Status: DC | PRN

## 2015-11-10 MED ORDER — LOSARTAN POTASSIUM 50 MG PO TABS
100.0000 mg | ORAL_TABLET | Freq: Every day | ORAL | Status: DC
  Administered 2015-11-11 – 2015-11-27 (×16): 100 mg via ORAL
  Filled 2015-11-10 (×16): qty 2

## 2015-11-10 MED ORDER — IPRATROPIUM-ALBUTEROL 0.5-2.5 (3) MG/3ML IN SOLN: 3.0000 mL | Freq: Four times a day (QID) | RESPIRATORY_TRACT | Status: DC | PRN

## 2015-11-10 MED ORDER — ONDANSETRON HCL 4 MG PO TABS: 4.0000 mg | ORAL_TABLET | Freq: Four times a day (QID) | ORAL | Status: DC | PRN

## 2015-11-10 MED ORDER — ASPIRIN EC 81 MG PO TBEC
81.0000 mg | DELAYED_RELEASE_TABLET | Freq: Every day | ORAL | Status: DC
  Administered 2015-11-11 – 2015-11-27 (×16): 81 mg via ORAL
  Filled 2015-11-10 (×18): qty 1

## 2015-11-10 MED ORDER — SPIRONOLACTONE 25 MG PO TABS
25.0000 mg | ORAL_TABLET | Freq: Every day | ORAL | Status: DC
Start: 2015-11-10 — End: 2015-11-20
  Administered 2015-11-11 – 2015-11-19 (×8): 25 mg via ORAL
  Filled 2015-11-10 (×9): qty 1

## 2015-11-10 MED ORDER — MAGNESIUM HYDROXIDE 400 MG/5ML PO SUSP
30.0000 mL | Freq: Every day | ORAL | Status: DC | PRN
  Administered 2015-11-11: 30 mL via ORAL
  Filled 2015-11-10 (×2): qty 30

## 2015-11-10 MED ORDER — SODIUM CHLORIDE 0.9 % IV SOLN: 250.0000 mL | INTRAVENOUS | Status: DC | PRN

## 2015-11-10 MED ORDER — VANCOMYCIN HCL IN DEXTROSE 1-5 GM/200ML-% IV SOLN
1000.0000 mg | Freq: Once | INTRAVENOUS | Status: AC
  Administered 2015-11-10: 1000 mg via INTRAVENOUS
  Filled 2015-11-10: qty 200

## 2015-11-10 MED ORDER — VANCOMYCIN HCL IN DEXTROSE 1-5 GM/200ML-% IV SOLN
1000.0000 mg | INTRAVENOUS | Status: AC
  Administered 2015-11-11 – 2015-11-25 (×15): 1000 mg via INTRAVENOUS
  Filled 2015-11-10 (×15): qty 200

## 2015-11-10 MED ORDER — CIPROFLOXACIN HCL 500 MG PO TABS
500.0000 mg | ORAL_TABLET | Freq: Two times a day (BID) | ORAL | Status: DC
  Administered 2015-11-10 – 2015-11-14 (×8): 500 mg via ORAL
  Filled 2015-11-10 (×9): qty 1

## 2015-11-10 MED ORDER — FUROSEMIDE 20 MG PO TABS
20.0000 mg | ORAL_TABLET | Freq: Two times a day (BID) | ORAL | Status: DC
  Administered 2015-11-10 – 2015-11-23 (×24): 20 mg via ORAL
  Filled 2015-11-10 (×25): qty 1

## 2015-11-10 MED ORDER — IOHEXOL 300 MG/ML  SOLN
80.0000 mL | Freq: Once | INTRAMUSCULAR | Status: AC | PRN
  Administered 2015-11-10: 80 mL via INTRAVENOUS

## 2015-11-10 MED ORDER — PANTOPRAZOLE SODIUM 40 MG PO TBEC
40.0000 mg | DELAYED_RELEASE_TABLET | Freq: Two times a day (BID) | ORAL | Status: DC
  Administered 2015-11-10 – 2015-11-27 (×33): 40 mg via ORAL
  Filled 2015-11-10 (×20): qty 1
  Filled 2015-11-10: qty 2
  Filled 2015-11-10 (×14): qty 1

## 2015-11-10 MED ORDER — ACETAMINOPHEN 325 MG PO TABS
650.0000 mg | ORAL_TABLET | Freq: Four times a day (QID) | ORAL | Status: DC | PRN
  Administered 2015-11-20 – 2015-11-27 (×15): 650 mg via ORAL
  Filled 2015-11-10 (×15): qty 2

## 2015-11-10 MED ORDER — SIMVASTATIN 20 MG PO TABS
20.0000 mg | ORAL_TABLET | Freq: Every day | ORAL | Status: DC
  Administered 2015-11-11 – 2015-11-27 (×15): 20 mg via ORAL
  Filled 2015-11-10 (×17): qty 1

## 2015-11-10 MED ORDER — OXYCODONE HCL 5 MG PO TABS
5.0000 mg | ORAL_TABLET | ORAL | Status: DC | PRN
  Administered 2015-11-10 – 2015-11-15 (×7): 5 mg via ORAL
  Filled 2015-11-10 (×8): qty 1

## 2015-11-10 MED ORDER — SODIUM CHLORIDE 0.9% FLUSH
3.0000 mL | Freq: Two times a day (BID) | INTRAVENOUS | Status: DC
  Administered 2015-11-10 – 2015-11-12 (×2): 3 mL via INTRAVENOUS

## 2015-11-10 MED ORDER — FLUTICASONE PROPIONATE 50 MCG/ACT NA SUSP: 1.0000 | Freq: Every day | NASAL | Status: DC | PRN

## 2015-11-10 MED ORDER — LEVOTHYROXINE SODIUM 88 MCG PO TABS
88.0000 ug | ORAL_TABLET | Freq: Every day | ORAL | Status: DC
  Administered 2015-11-11 – 2015-11-27 (×15): 88 ug via ORAL
  Filled 2015-11-10 (×16): qty 1

## 2015-11-10 MED ORDER — GABAPENTIN 100 MG PO CAPS
100.0000 mg | ORAL_CAPSULE | Freq: Two times a day (BID) | ORAL | Status: DC
  Administered 2015-11-10 – 2015-11-27 (×33): 100 mg via ORAL
  Filled 2015-11-10 (×35): qty 1

## 2015-11-10 MED ORDER — ACETAMINOPHEN 650 MG RE SUPP: 650.0000 mg | Freq: Four times a day (QID) | RECTAL | Status: DC | PRN

## 2015-11-10 MED ORDER — BISOPROLOL FUMARATE 5 MG PO TABS
10.0000 mg | ORAL_TABLET | Freq: Every day | ORAL | Status: DC
  Administered 2015-11-10 – 2015-11-27 (×17): 10 mg via ORAL
  Filled 2015-11-10 (×3): qty 2
  Filled 2015-11-10 (×2): qty 1
  Filled 2015-11-10 (×2): qty 2
  Filled 2015-11-10 (×2): qty 1
  Filled 2015-11-10 (×4): qty 2
  Filled 2015-11-10: qty 1
  Filled 2015-11-10: qty 2
  Filled 2015-11-10: qty 1
  Filled 2015-11-10 (×5): qty 2
  Filled 2015-11-10: qty 1

## 2015-11-10 MED ORDER — ONDANSETRON HCL 4 MG/2ML IJ SOLN: 4.0000 mg | Freq: Four times a day (QID) | INTRAMUSCULAR | Status: DC | PRN

## 2015-11-10 MED ORDER — POTASSIUM CHLORIDE CRYS ER 20 MEQ PO TBCR
20.0000 meq | EXTENDED_RELEASE_TABLET | Freq: Every day | ORAL | Status: DC
  Administered 2015-11-11 – 2015-11-19 (×8): 20 meq via ORAL
  Filled 2015-11-10 (×9): qty 1

## 2015-11-10 NOTE — ED Notes (Signed)
Patient placed on monitor, continuous pulse oximetry and blood pressure cuff; visitor at bedside 

## 2015-11-10 NOTE — ED Notes (Signed)
Patient transported to CT 

## 2015-11-10 NOTE — ED Notes (Signed)
Brought patient back to room via wheelchair with family in tow; patient now getting undressed and into a gown at this; visitor at bedside

## 2015-11-10 NOTE — ED Notes (Signed)
Attempted report 

## 2015-11-10 NOTE — H&P (Signed)
Subjective:   Chief complaint: Sternal incision drainage  History of the present illness:  The patient is a 81 year old white female who is status post mitral valve replacement, tricuspid valve repair, CABGx 1 with maze procedure in August of last year. She has had some ongoing difficulties with her sternal incision. She has been on oral antibiotics, however recently the sternal drainage is felt to be substantially increased. Her husband called our office today and was told to present to the emergency department for further evaluation. She denies fevers, chills or other constitutional symptoms. In general she feels quite well. She is felt to require further evaluation and treatment to include CT scan and may require surgical drainage/debridement of some kind depending on results and clinical course.    Patient Active Problem List   Diagnosis Date Noted  . Wound infection ([place]) 10/13/2015  . Chronic diastolic CHF (congestive heart failure) ([place])   . S/P placement of cardiac pacemaker 05/02/15, medtronic 05/03/2015  . Bradycardia 05/03/2015  . Post-surgical complete heart block, symptomatic 05/03/2015  . S/P mitral valve replacement with bioprosthetic valve, tricuspid valve repair, maze procedure and CABG x1 04/25/2015  . S/P tricuspid valve repair 04/25/2015  . S/P Maze operation for atrial fibrillation 04/25/2015  . S/P CABG x 1 04/25/2015  . Coronary artery disease   . Severe mitral regurgitation 03/09/2015  . Chronic systolic congestive heart failure ([place]) 03/09/2015  . Pulmonary hypertension ([place])   . Tricuspid regurgitation   . Chronic combined systolic and diastolic CHF (congestive heart failure) ([place])   . Atrial fibrillation, unspecified 11/10/2014  . Chronic systolic heart failure ([place]) 11/10/2014  . Essential hypertension 11/10/2014  . Mitral regurgitation 11/10/2014  . Hyperlipidemia 11/10/2014   Past Medical History  Diagnosis Date  . Hypertension   . COPD (chronic  obstructive pulmonary disease) ([place])   . Hyperlipidemia   . Meniere disease   . HX: breast cancer   . PAF (paroxysmal atrial fibrillation) ([place])   . COPD (chronic obstructive pulmonary disease) ([place])   . Hypothyroidism   . Breast cancer ([place]) 08/09/2001    right/rad  . Pulmonary hypertension ([place])   . Chronic diastolic CHF (congestive heart failure) ([place])   . PONV (postoperative nausea and vomiting)   . Anxiety   . GERD (gastroesophageal reflux disease)   . Arthritis   . Severe Mitral Regurgitation s/p MV Repair     a. 04/2015 s/p 27 mm [facility] Mitral bovine bioprosthetic tissue valve  . Tricuspid Regurgitation s/p Repair     a. 04/2015 s/p 26 mm Edwards mc3 ring annuloplasty  . 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 04/2015 Complete bilateral atrial lesion set using cryothermy and bipolar radiofrequency ablation with clipping of LA appendage  . CAD s/p CABG x 1     a. 04/2015 LIMA to LAD  . Post-surgical complete heart block, symptomatic     a. 04/2015 s/p MDT [id number] Advisa DR MRI DC PPM.  . Wound infection ([place]) 10/13/2015    Superficial sternal wound infection    Past Surgical History  Procedure Laterality Date  . Cochlear implant Bilateral   . Mastectomy, partial Right   . Cardiac catheterization  11/2013    [facility]  . Cardiac catheterization  10/2014    [facility]  . Breast biopsy Left 11/25/06    neg  . Breast biopsy Left 01/20/12    lt bx /clip-neg  . Eye surgery Bilateral 2014    cataract surgery  .  Mitral valve repair N/A 04/25/2015    Procedure: MITRAL VALVE  REPLACEMENT using a 27 mm Edwards Perimount Magna Mitral Ease Valve;  Surgeon: Rexene Alberts, MD;  Location: [place];  Service: Open Heart Surgery;  Laterality: N/A;  . Tricuspid valve replacement N/A 04/25/2015    Procedure: TRICUSPID VALVE REPAIR;  Surgeon: Rexene Alberts, MD;  Location: [place];  Service: Open Heart Surgery;  Laterality: N/A;  . Maze N/A 04/25/2015    Procedure: MAZE;  Surgeon: Rexene Alberts, MD;   Location: [place];  Service: Open Heart Surgery;  Laterality: N/A;  . Tee without cardioversion N/A 04/25/2015    Procedure: TRANSESOPHAGEAL ECHOCARDIOGRAM (TEE);  Surgeon: Rexene Alberts, MD;  Location: [place];  Service: Open Heart Surgery;  Laterality: N/A;  . Clipping of atrial appendage N/A 04/25/2015    Procedure: CLIPPING OF ATRIAL APPENDAGE;  Surgeon: Rexene Alberts, MD;  Location: [place];  Service: Open Heart Surgery;  Laterality: N/A;  . Ep implantable device N/A 05/02/2015    Procedure: Pacemaker Implant;  Surgeon: Thompson Grayer, MD;  Location: [place] [facility];  Service: Cardiovascular;  Laterality: N/A;  . Sternal wound debridement N/A 05/09/2015    Procedure: STERNAL WOUND DEBRIDEMENT;  Surgeon: Rexene Alberts, MD;  Location: [place];  Service: Thoracic;  Laterality: N/A;  . Application of wound vac N/A 05/09/2015    Procedure: APPLICATION OF WOUND VAC;  Surgeon: Rexene Alberts, MD;  Location: [facility];  Service: Thoracic;  Laterality: N/A;  . Coronary artery bypass graft N/A 04/25/2015    Procedure: CORONARY ARTERY BYPASS GRAFTING (CABG) x ONE, using left internal mammary artery;  Surgeon: Rexene Alberts, MD;  Location: [place];  Service: Open Heart Surgery;  Laterality: N/A;  . Sternal wires removal N/A 06/05/2015    Procedure: STERNAL WIRES REMOVAL;  Surgeon: Rexene Alberts, MD;  Location: [place];  Service: Thoracic;  Laterality: N/A;     (Not in a hospital admission) Allergies  Allergen Reactions  . Augmentin [Amoxicillin-Pot Clavulanate] Swelling    Swollen joints, pain  . Nifedipine     Other reaction(s): Unknown  . Penicillins     Other reaction(s): Other (See Comments) Swollen joints  . Propranolol     Other reaction(s): Unknown  . Ace Inhibitors     Other reaction(s): Cough  . Diazepam     Other reaction(s): Other (See Comments) Made her "hyper"  . Meperidine     Other reaction(s): Other (See Comments) Made her "hyper"  . Propoxyphene     Other reaction(s): Other (See  Comments) Made her "hyper"    Social History  Substance Use Topics  . Smoking status: Former Smoker -- 40 years    Types: Cigarettes    Quit date: 04/20/1998  . Smokeless tobacco: Not on file  . Alcohol Use: No    Family History  Problem Relation Age of Onset  . Heart disease Father   . Heart disease Brother   . Heart attack Paternal Uncle     No current facility-administered medications for this encounter.  Current outpatient prescriptions:  .  acetaminophen (TYLENOL) 325 MG tablet, Take 2 tablets (650 mg total) by mouth every 4 (four) hours as needed for mild pain (or Fever >/= 101)., Disp: , Rfl:  .  aspirin EC 81 MG EC tablet, Take 1 tablet (81 mg total) by mouth daily., Disp: , Rfl:  .  benzonatate (TESSALON) 100 MG capsule, Take 100 mg by mouth 3 (  three) times daily as needed for cough (for cold). , Disp: , Rfl:  .  bisoprolol (ZEBETA) 10 MG tablet, Take 1 tablet (10 mg total) by mouth daily., Disp: 30 tablet, Rfl: 5 .  ciprofloxacin (CIPRO) 500 MG tablet, Take 1 tablet (500 mg total) by mouth 2 (two) times daily., Disp: 60 tablet, Rfl: 0 .  fluticasone (FLONASE) 50 MCG/ACT nasal spray, Place 1 spray into both nostrils daily as needed for allergies. , Disp: , Rfl:  .  furosemide (LASIX) 20 MG tablet, Take 20 mg by mouth 2 (two) times daily. , Disp: , Rfl:  .  gabapentin (NEURONTIN) 100 MG capsule, Take 100 mg by mouth 2 (two) times daily. , Disp: , Rfl: 1 .  ipratropium-albuterol (DUONEB) 0.5-2.5 (3) MG/3ML SOLN, Take 3 mLs by nebulization every 6 (six) hours as needed (for shortness of breath)., Disp: 360 mL, Rfl: 10 .  levothyroxine (SYNTHROID, LEVOTHROID) 88 MCG tablet, Take 88 mcg by mouth daily before breakfast., Disp: , Rfl:  .  losartan (COZAAR) 100 MG tablet, Take 1 tablet (100 mg total) by mouth daily., Disp: 30 tablet, Rfl: 3 .  pantoprazole (PROTONIX) 40 MG tablet, Take 40 mg by mouth 2 (two) times daily. , Disp: , Rfl:  .  potassium chloride SA (K-DUR,KLOR-CON) 20  MEQ tablet, Take 1 tablet (20 mEq total) by mouth daily., Disp: , Rfl:  .  simvastatin (ZOCOR) 20 MG tablet, Take 20 mg by mouth daily., Disp: , Rfl:  .  spironolactone (ALDACTONE) 25 MG tablet, Take 1 tablet (25 mg total) by mouth daily., Disp: 30 tablet, Rfl: 6 .  warfarin (COUMADIN) 2.5 MG tablet, TAKE 1 TABLET BY MOUTH AS DIRECTED, Disp: 30 tablet, Rfl: 2 Review of Systems-   Pertinent items are noted in HPI.  Objective:   Patient Vitals for the past 8 hrs:  BP Temp Temp src Pulse Resp SpO2  11/10/15 1008 143/75 mmHg 97.4 F (36.3 C) Oral 78 22 100 %          BP 143/75 mmHg  Pulse 78  Temp(Src) 97.4 F (36.3 C) (Oral)  Resp 22  SpO2 100% General appearance: alert, cooperative, appears stated age and no distress Head: Normocephalic, without obvious abnormality, atraumatic Eyes: negative Ears: normal TM's and external ear canals both ears  Nose: Nares normal. Septum midline. Mucosa normal. No drainage or sinus tenderness. Throat: lips, mucosa, and tongue normal; teeth and gums normal Neck: no adenopathy, no JVD, supple, symmetrical, trachea midline and thyroid not enlarged, symmetric, no tenderness/mass/nodules Back: symmetric, no curvature. ROM normal. No CVA tenderness. Lungs: clear to auscultation bilaterally Heart: regular rate and rhythm, S1, S2 normal, no murmur, click, rub or gallop Abdomen: soft, non-tender; bowel sounds normal; no masses,  no organomegaly Extremities: extremities normal, atraumatic, no cyanosis or edema Pulses: DP palp bilat Skin: Skin color, texture, turgor normal. No rashes or lesions Lymph nodes: Cervical, supraclavicular, and axillary nodes normal. Neurologic: Grossly normal .  Data Review CBC:  Lab Results  Component Value Date   WBC 10.9* 10/30/2015   WBC WILL FOLLOW 10/03/2015   RBC 4.29 10/30/2015   RBC WILL FOLLOW 10/03/2015   HEMOGLOBIN WILL FOLLOW 10/03/2015    Assessment:   Sternal wound drainage status post above  described open heart surgery. There may be a component of cartilage necrosis. She will be reevaluated with CT scan  Plan:   She will be admitted for further evaluation and treatment and pending results of testing including CT scan of the  chest as well as cultures further decisions will be made regarding management.  I have seen and examined the patient and agree with the assessment and plan as outlined.  Empiric Vancomycin and Cipro until culture results back.  Hold coumadin in expectation of possible need for wound debridement.  CT scan of chest.  Rexene Alberts, MD 11/10/2015 1:38 PM

## 2015-11-10 NOTE — ED Notes (Signed)
Pt w/ surgical incision to midline chest from previous heart surgery in Aug 2016 - pt admits at that time developed infection in surgical site, went to OR for debridement and had a wound vac placed. Pt states approx 44month ago a "blister" developed on the surgical incision site that "popped" per pt and family. Wound has been draining since and gotten progressively worse - drainage is brown and foul odor noted. Pt has seen her PCP x2 and has been on x15 days of cipro and drainage continues to get worse. Pt denies n/v/d, fever or chills. Pt A&Ox4, no acute distress - husband at bedside.

## 2015-11-10 NOTE — Progress Notes (Signed)
Pharmacy Antibiotic Note  [name] is a 81 y.o. female admitted on 11/10/2015 with wound infection.  Pharmacy has been consulted for Vancomycin dosing. WBC 9.3, afebrile, CrCl ~6mL/min. Pt also receiving therapy with ciprofloxacin.  Goals:  Vancomycin trough 10-15  Plan:  Vancomycin x1 in the ED Vancomycin 1g IV Q24h  F/U c/s, fenal fxn, VT @ss , LOT  Temp (24hrs), Avg:97.4 F (36.3 C), Min:97.4 F (36.3 C), Max:97.4 F (36.3 C)   Recent Labs Lab 11/10/15 1155  WBC 9.3  CREATININE 0.79    Estimated Creatinine Clearance: 49.3 mL/min (by C-G formula based on Cr of 0.79).    Allergies  Allergen Reactions  . Augmentin [Amoxicillin-Pot Clavulanate] Swelling    Swollen joints, pain  . Nifedipine     Other reaction(s): Unknown  . Penicillins     Other reaction(s): Other (See Comments) Swollen joints  . Propranolol     Other reaction(s): Unknown  . Ace Inhibitors     Other reaction(s): Cough  . Diazepam     Other reaction(s): Other (See Comments) Made her "hyper"  . Meperidine     Other reaction(s): Other (See Comments) Made her "hyper"  . Propoxyphene     Other reaction(s): Other (See Comments) Made her "hyper"    Antimicrobials this admission: 3/2 vanc>> 3/3 Cipro>>   Thank you for allowing pharmacy to be a part of this patient's care.  Madeline Bebout C. Lennox Grumbles, PharmD Pharmacy Resident  Pager: [phone] 11/10/2015 1:41 PM

## 2015-11-10 NOTE — ED Notes (Signed)
Attempted report x 2 

## 2015-11-10 NOTE — ED Notes (Signed)
Pt here with wound infection from CABG; pt sts issues with same x 4 weeks normally sees Dr Ricard Dillon; pt taking cipro at present

## 2015-11-11 LAB — ACID FAST SMEAR (AFB, MYCOBACTERIA): Acid Fast Smear: NEGATIVE

## 2015-11-11 LAB — ACID FAST SMEAR (AFB)

## 2015-11-11 NOTE — Progress Notes (Addendum)
      [address]       [place] [address]             [phone]          Procedure(s) (LRB): STERNAL WOUND DEBRIDEMENT (N/A) APPLICATION OF WOUND VAC (N/A)  Subjective: CT results noted  Objective: Vital signs in last 24 hours: Temp:  [97.4 F (36.3 C)-98.1 F (36.7 C)] 98.1 F (36.7 C) (03/04 0537) Pulse Rate:  [64-91] 64 (03/04 0537) Cardiac Rhythm:  [-] Atrial paced (03/03 1900) Resp:  [18-22] 18 (03/04 0537) BP: (108-143)/(45-75) 112/45 mmHg (03/04 0537) SpO2:  [96 %-100 %] 96 % (03/04 0537) Weight:  [132 lb 3.2 oz (59.966 kg)] 132 lb 3.2 oz (59.966 kg) (03/04 0537)  Current Weight  11/11/15 132 lb 3.2 oz (59.966 kg)      Intake/Output from previous day: 03/03 0701 - 03/04 0700 In: 22 [P.O.:480; I.V.:6] Out: -    Physical Exam:  Cardiovascular: RRR Pulmonary: Clear to auscultation bilaterally; no rales, wheezes, or rhonchi. Abdomen: Soft, non tender, bowel sounds present. Extremities: Trace lower extremity edema. Wound: Purulence from lower sternal wound  Lab Results: CBC: Recent Labs  11/10/15 1155  WBC 9.3  HGB 10.5*  HCT 33.5*  PLT 186   BMET:  Recent Labs  11/10/15 1155  NA 140  K 4.2  CL 104  CO2 26  GLUCOSE 113*  BUN 17  CREATININE 0.79  CALCIUM 9.5    PT/INR:  Lab Results  Component Value Date   INR 1.74* 11/10/2015   INR 1.6 11/07/2015   INR 1.4 11/01/2015   ABG:  INR: Will add last result for INR, ABG once components are confirmed Will add last 4 CBG results once components are confirmed  Assessment/Plan:  1. CV - Paced. On Bisoprolol 10 mg daily, Spironolactone 25 mg daily and Cozaar 100 mg daily. 2.  ID-continue Vancomycin and Cipro empirically 3. Sternal wound infection-to OR Monday for debridement 4. Anemia-H and H 10.5 and 33.5 5. Per Dr. Roxy Manns, will check INR in am (not currently on Coumadin) and give oral vitamin K  ZIMMERMAN,DONIELLE MPA-C 11/11/2015,9:05 AM   Chart reviewed, patient  examined, agree with above. Remains afebrile with no leukocytosis.

## 2015-11-12 LAB — CULTURE, ROUTINE-ABSCESS
CULTURE: NO GROWTH
GRAM STAIN: NONE SEEN

## 2015-11-12 LAB — PROTIME-INR
INR: 1.4 (ref 0.00–1.49)
PROTHROMBIN TIME: 17.3 s — AB (ref 11.6–15.2)

## 2015-11-12 MED ORDER — PHYTONADIONE 5 MG PO TABS
2.5000 mg | ORAL_TABLET | Freq: Once | ORAL | Status: AC
  Administered 2015-11-12: 2.5 mg via ORAL
  Filled 2015-11-12: qty 1

## 2015-11-12 MED ORDER — PHYTONADIONE 5 MG PO TABS
10.0000 mg | ORAL_TABLET | Freq: Once | ORAL | Status: AC
  Administered 2015-11-12: 10 mg via ORAL
  Filled 2015-11-12: qty 2

## 2015-11-12 MED ORDER — BISACODYL 5 MG PO TBEC
5.0000 mg | DELAYED_RELEASE_TABLET | Freq: Once | ORAL | Status: AC
  Administered 2015-11-12: 5 mg via ORAL
  Filled 2015-11-12: qty 1

## 2015-11-12 NOTE — Progress Notes (Addendum)
      [address]       ,[place] [address]             [phone]           Subjective: Patient sitting in chair. Only complaint is a "bump" at sternal wound and constipation  Objective: Vital signs in last 24 hours: Temp:  [97.6 F (36.4 C)-97.9 F (36.6 C)] 97.6 F (36.4 C) (03/05 0517) Pulse Rate:  [62-64] 62 (03/05 0517) Cardiac Rhythm:  [-] Ventricular paced (03/04 1901) Resp:  [16-17] 17 (03/05 0517) BP: (101-129)/(41-67) 129/67 mmHg (03/05 0517) SpO2:  [97 %-98 %] 97 % (03/05 0517)  Current Weight  11/11/15 132 lb 3.2 oz (59.966 kg)      Intake/Output from previous day: 03/04 0701 - 03/05 0700 In: 483 [P.O.:480; I.V.:3] Out: -    Physical Exam:  Cardiovascular: RRR Pulmonary: Clear to auscultation bilaterally; no rales, wheezes, or rhonchi. Abdomen: Soft, non tender, bowel sounds present. Extremities: Trace lower extremity edema. Wound: Purulence from lower sternal wound  Lab Results: CBC:  Recent Labs  11/10/15 1155  WBC 9.3  HGB 10.5*  HCT 33.5*  PLT 186   BMET:   Recent Labs  11/10/15 1155  NA 140  K 4.2  CL 104  CO2 26  GLUCOSE 113*  BUN 17  CREATININE 0.79  CALCIUM 9.5    PT/INR:  Lab Results  Component Value Date   INR 1.40 11/12/2015   INR 1.74* 11/10/2015   INR 1.6 11/07/2015   ABG:  INR: Will add last result for INR, ABG once components are confirmed Will add last 4 CBG results once components are confirmed  Assessment/Plan:  1. CV - Paced. On Bisoprolol 10 mg daily, Spironolactone 25 mg daily and Cozaar 100 mg daily. 2.  ID-continue Vancomycin and Cipro empirically. Wound culture shows no growth to date, AFB negative, blood culture no growth to date 3. Sternal wound infection-to OR Monday for debridement 4. Anemia-H and H 10.5 and 33.5 5. Per Dr. Roxy Manns, will check INR in am (not currently on Coumadin) and give oral vitamin K today 6. Ex lax at patient request  ZIMMERMAN,DONIELLE  MPA-C 11/12/2015,8:28 AM   Chart reviewed, patient examined, agree with above. Plan I&D tomorrow in the OR by Dr. Roxy Manns

## 2015-11-12 NOTE — Progress Notes (Signed)
Utilization Review Completed.[name] T3/01/2016  

## 2015-11-13 ENCOUNTER — Inpatient Hospital Stay (HOSPITAL_COMMUNITY): Payer: Medicare Other | Admitting: Anesthesiology

## 2015-11-13 ENCOUNTER — Encounter (HOSPITAL_COMMUNITY): Payer: Self-pay | Admitting: Anesthesiology

## 2015-11-13 ENCOUNTER — Telehealth: Payer: Self-pay | Admitting: *Deleted

## 2015-11-13 ENCOUNTER — Ambulatory Visit: Payer: Medicare PPO | Admitting: Pulmonary Disease

## 2015-11-13 ENCOUNTER — Ambulatory Visit: Payer: Medicare Other | Admitting: Thoracic Surgery (Cardiothoracic Vascular Surgery)

## 2015-11-13 ENCOUNTER — Encounter: Payer: Self-pay | Admitting: *Deleted

## 2015-11-13 ENCOUNTER — Encounter (HOSPITAL_COMMUNITY)
Admission: EM | Disposition: A | Discharge status: Home / Self Care | Payer: Self-pay | Source: Home / Self Care | Attending: Thoracic Surgery (Cardiothoracic Vascular Surgery)

## 2015-11-13 ENCOUNTER — Inpatient Hospital Stay (HOSPITAL_COMMUNITY): Payer: Medicare Other

## 2015-11-13 DIAGNOSIS — L089 Local infection of the skin and subcutaneous tissue, unspecified: Secondary | ICD-10-CM

## 2015-11-13 HISTORY — PX: APPLICATION OF WOUND VAC: SHX5189

## 2015-11-13 HISTORY — PX: STERNAL WOUND DEBRIDEMENT: SHX1058

## 2015-11-13 LAB — URINALYSIS, ROUTINE W REFLEX MICROSCOPIC
BILIRUBIN URINE: NEGATIVE
Glucose, UA: NEGATIVE mg/dL
Ketones, ur: NEGATIVE mg/dL
Leukocytes, UA: NEGATIVE
Nitrite: NEGATIVE
PH: 5 (ref 5.0–8.0)
Protein, ur: NEGATIVE mg/dL
SPECIFIC GRAVITY, URINE: 1.02 (ref 1.005–1.030)

## 2015-11-13 LAB — CBC
HEMATOCRIT: 34.2 % — AB (ref 36.0–46.0)
HEMOGLOBIN: 10.1 g/dL — AB (ref 12.0–15.0)
MCH: 25.7 pg — ABNORMAL LOW (ref 26.0–34.0)
MCHC: 29.5 g/dL — ABNORMAL LOW (ref 30.0–36.0)
MCV: 87 fL (ref 78.0–100.0)
Platelets: 188 10*3/uL (ref 150–400)
RBC: 3.93 MIL/uL (ref 3.87–5.11)
RDW: 19.9 % — AB (ref 11.5–15.5)
WBC: 9.3 10*3/uL (ref 4.0–10.5)

## 2015-11-13 LAB — BLOOD GAS, ARTERIAL
ACID-BASE EXCESS: 2.6 mmol/L — AB (ref 0.0–2.0)
BICARBONATE: 26.7 meq/L — AB (ref 20.0–24.0)
Drawn by: 276051
FIO2: 0.21
O2 SAT: 95.2 %
PCO2 ART: 41.1 mmHg (ref 35.0–45.0)
PO2 ART: 75.5 mmHg — AB (ref 80.0–100.0)
Patient temperature: 98.6
TCO2: 27.9 mmol/L (ref 0–100)
pH, Arterial: 7.428 (ref 7.350–7.450)

## 2015-11-13 LAB — COMPREHENSIVE METABOLIC PANEL
ALK PHOS: 87 U/L (ref 38–126)
ALT: 25 U/L (ref 14–54)
AST: 24 U/L (ref 15–41)
Albumin: 3.2 g/dL — ABNORMAL LOW (ref 3.5–5.0)
Anion gap: 13 (ref 5–15)
BILIRUBIN TOTAL: 0.5 mg/dL (ref 0.3–1.2)
BUN: 16 mg/dL (ref 6–20)
CALCIUM: 9.3 mg/dL (ref 8.9–10.3)
CO2: 26 mmol/L (ref 22–32)
CREATININE: 0.79 mg/dL (ref 0.44–1.00)
Chloride: 101 mmol/L (ref 101–111)
GFR calc Af Amer: >60 mL/min (ref >60)
Glucose, Bld: 114 mg/dL — ABNORMAL HIGH (ref 65–99)
POTASSIUM: 4.4 mmol/L (ref 3.5–5.1)
Sodium: 140 mmol/L (ref 135–145)
TOTAL PROTEIN: 6.7 g/dL (ref 6.5–8.1)

## 2015-11-13 LAB — TYPE AND SCREEN
ABO/RH(D): O POS
ANTIBODY SCREEN: NEGATIVE

## 2015-11-13 LAB — PROTIME-INR
INR: 1.29 (ref 0.00–1.49)
Prothrombin Time: 16.2 seconds — ABNORMAL HIGH (ref 11.6–15.2)

## 2015-11-13 LAB — APTT: aPTT: 32 seconds (ref 24–37)

## 2015-11-13 LAB — URINE MICROSCOPIC-ADD ON

## 2015-11-13 SURGERY — DEBRIDEMENT, WOUND, STERNUM
Anesthesia: General | Site: Chest

## 2015-11-13 MED ORDER — SENNOSIDES-DOCUSATE SODIUM 8.6-50 MG PO TABS
1.0000 | ORAL_TABLET | Freq: Every day | ORAL | Status: DC
  Administered 2015-11-13 – 2015-11-26 (×12): 1 via ORAL
  Filled 2015-11-13 (×14): qty 1

## 2015-11-13 MED ORDER — OXYCODONE HCL 5 MG PO TABS
5.0000 mg | ORAL_TABLET | ORAL | Status: DC | PRN
  Administered 2015-11-15: 5 mg via ORAL
  Administered 2015-11-17 – 2015-11-19 (×3): 10 mg via ORAL
  Filled 2015-11-13: qty 2
  Filled 2015-11-13: qty 1
  Filled 2015-11-13 (×2): qty 2

## 2015-11-13 MED ORDER — PROPOFOL 10 MG/ML IV BOLUS
INTRAVENOUS | Status: DC | PRN
  Administered 2015-11-13: 110 mg via INTRAVENOUS
  Administered 2015-11-13: 30 mg via INTRAVENOUS

## 2015-11-13 MED ORDER — LACTATED RINGERS IV SOLN
INTRAVENOUS | Status: DC
  Administered 2015-11-13: 1 mL via INTRAVENOUS
  Administered 2015-11-13: 12:00:00 via INTRAVENOUS

## 2015-11-13 MED ORDER — FENTANYL CITRATE (PF) 100 MCG/2ML IJ SOLN
INTRAMUSCULAR | Status: AC
  Administered 2015-11-13: 25 ug via INTRAVENOUS
  Filled 2015-11-13: qty 2

## 2015-11-13 MED ORDER — LACTATED RINGERS IV SOLN: INTRAVENOUS | Status: DC

## 2015-11-13 MED ORDER — SODIUM CHLORIDE 0.9 % IR SOLN
Status: DC | PRN
  Administered 2015-11-13: 1000 mL

## 2015-11-13 MED ORDER — ROCURONIUM BROMIDE 50 MG/5ML IV SOLN
INTRAVENOUS | Status: AC
  Filled 2015-11-13: qty 1

## 2015-11-13 MED ORDER — LIDOCAINE HCL (CARDIAC) 20 MG/ML IV SOLN
INTRAVENOUS | Status: DC | PRN
  Administered 2015-11-13: 60 mg via INTRAVENOUS

## 2015-11-13 MED ORDER — DIPHENHYDRAMINE HCL 12.5 MG/5ML PO ELIX: 12.5000 mg | ORAL_SOLUTION | Freq: Four times a day (QID) | ORAL | Status: DC | PRN

## 2015-11-13 MED ORDER — PROMETHAZINE HCL 25 MG/ML IJ SOLN: 6.2500 mg | INTRAMUSCULAR | Status: DC | PRN

## 2015-11-13 MED ORDER — PROPOFOL 10 MG/ML IV BOLUS
INTRAVENOUS | Status: AC
  Filled 2015-11-13: qty 20

## 2015-11-13 MED ORDER — BISACODYL 5 MG PO TBEC
10.0000 mg | DELAYED_RELEASE_TABLET | Freq: Every day | ORAL | Status: DC
  Administered 2015-11-14 – 2015-11-23 (×8): 10 mg via ORAL
  Filled 2015-11-13 (×13): qty 2

## 2015-11-13 MED ORDER — PHENYLEPHRINE HCL 10 MG/ML IJ SOLN
INTRAMUSCULAR | Status: DC | PRN
  Administered 2015-11-13 (×4): 40 ug via INTRAVENOUS

## 2015-11-13 MED ORDER — ONDANSETRON HCL 4 MG/2ML IJ SOLN: 4.0000 mg | Freq: Four times a day (QID) | INTRAMUSCULAR | Status: DC | PRN

## 2015-11-13 MED ORDER — FENTANYL 40 MCG/ML IV SOLN
INTRAVENOUS | Status: DC
  Administered 2015-11-13: 170 ug via INTRAVENOUS
  Administered 2015-11-13: 16:00:00 via INTRAVENOUS
  Administered 2015-11-14: 50 ug via INTRAVENOUS
  Administered 2015-11-14: 120 ug via INTRAVENOUS
  Administered 2015-11-14: 30 ug via INTRAVENOUS

## 2015-11-13 MED ORDER — MIDAZOLAM HCL 2 MG/2ML IJ SOLN
INTRAMUSCULAR | Status: AC
  Filled 2015-11-13: qty 2

## 2015-11-13 MED ORDER — ENOXAPARIN SODIUM 40 MG/0.4ML ~~LOC~~ SOLN
40.0000 mg | Freq: Every day | SUBCUTANEOUS | Status: DC
  Administered 2015-11-14 – 2015-11-19 (×5): 40 mg via SUBCUTANEOUS
  Filled 2015-11-13 (×5): qty 0.4

## 2015-11-13 MED ORDER — PHENYLEPHRINE HCL 10 MG/ML IJ SOLN
10.0000 mg | INTRAMUSCULAR | Status: DC | PRN
  Administered 2015-11-13: 25 ug/min via INTRAVENOUS

## 2015-11-13 MED ORDER — MORPHINE SULFATE (PF) 2 MG/ML IV SOLN
2.0000 mg | INTRAVENOUS | Status: DC | PRN
  Administered 2015-11-17: 2 mg via INTRAVENOUS
  Filled 2015-11-13: qty 1

## 2015-11-13 MED ORDER — FENTANYL 40 MCG/ML IV SOLN
INTRAVENOUS | Status: AC
  Filled 2015-11-13: qty 25

## 2015-11-13 MED ORDER — SODIUM CHLORIDE 0.9% FLUSH: 9.0000 mL | INTRAVENOUS | Status: DC | PRN

## 2015-11-13 MED ORDER — LACTATED RINGERS IV SOLN
INTRAVENOUS | Status: DC | PRN
  Administered 2015-11-13: 14:00:00 via INTRAVENOUS

## 2015-11-13 MED ORDER — KCL IN DEXTROSE-NACL 20-5-0.45 MEQ/L-%-% IV SOLN: INTRAVENOUS | Status: AC

## 2015-11-13 MED ORDER — FENTANYL CITRATE (PF) 250 MCG/5ML IJ SOLN
INTRAMUSCULAR | Status: AC
  Filled 2015-11-13: qty 5

## 2015-11-13 MED ORDER — ACETAMINOPHEN 500 MG PO TABS
1000.0000 mg | ORAL_TABLET | Freq: Four times a day (QID) | ORAL | Status: AC
  Administered 2015-11-14 – 2015-11-17 (×15): 1000 mg via ORAL
  Filled 2015-11-13 (×18): qty 2

## 2015-11-13 MED ORDER — EPHEDRINE SULFATE 50 MG/ML IJ SOLN
INTRAMUSCULAR | Status: DC | PRN
  Administered 2015-11-13 (×3): 5 mg via INTRAVENOUS

## 2015-11-13 MED ORDER — TRAMADOL HCL 50 MG PO TABS
50.0000 mg | ORAL_TABLET | Freq: Four times a day (QID) | ORAL | Status: DC | PRN
  Administered 2015-11-14 – 2015-11-26 (×4): 50 mg via ORAL
  Filled 2015-11-13 (×4): qty 1

## 2015-11-13 MED ORDER — MIDAZOLAM HCL 5 MG/5ML IJ SOLN
INTRAMUSCULAR | Status: DC | PRN
  Administered 2015-11-13 (×2): 1 mg via INTRAVENOUS

## 2015-11-13 MED ORDER — SUCCINYLCHOLINE CHLORIDE 20 MG/ML IJ SOLN
INTRAMUSCULAR | Status: DC | PRN
  Administered 2015-11-13: 100 mg via INTRAVENOUS

## 2015-11-13 MED ORDER — ACETAMINOPHEN 160 MG/5ML PO SOLN: 1000.0000 mg | Freq: Four times a day (QID) | ORAL | Status: AC

## 2015-11-13 MED ORDER — FENTANYL CITRATE (PF) 100 MCG/2ML IJ SOLN
25.0000 ug | INTRAMUSCULAR | Status: DC | PRN
  Administered 2015-11-13 (×2): 25 ug via INTRAVENOUS

## 2015-11-13 MED ORDER — DIPHENHYDRAMINE HCL 50 MG/ML IJ SOLN: 12.5000 mg | Freq: Four times a day (QID) | INTRAMUSCULAR | Status: DC | PRN

## 2015-11-13 MED ORDER — POTASSIUM CHLORIDE 10 MEQ/50ML IV SOLN: 10.0000 meq | Freq: Every day | INTRAVENOUS | Status: DC | PRN

## 2015-11-13 MED ORDER — FENTANYL CITRATE (PF) 100 MCG/2ML IJ SOLN
INTRAMUSCULAR | Status: DC | PRN
  Administered 2015-11-13 (×2): 50 ug via INTRAVENOUS

## 2015-11-13 MED ORDER — DEXTROSE 5 % IV SOLN
1.5000 g | INTRAVENOUS | Status: AC
  Administered 2015-11-13: 1.5 g via INTRAVENOUS
  Filled 2015-11-13: qty 1.5

## 2015-11-13 MED ORDER — EPHEDRINE SULFATE 50 MG/ML IJ SOLN
INTRAMUSCULAR | Status: AC
  Filled 2015-11-13: qty 1

## 2015-11-13 MED ORDER — ONDANSETRON HCL 4 MG/2ML IJ SOLN
INTRAMUSCULAR | Status: AC
  Filled 2015-11-13: qty 2

## 2015-11-13 MED ORDER — NALOXONE HCL 0.4 MG/ML IJ SOLN: 0.4000 mg | INTRAMUSCULAR | Status: DC | PRN

## 2015-11-13 MED ORDER — SODIUM CHLORIDE 0.9 % IJ SOLN
INTRAMUSCULAR | Status: AC
  Filled 2015-11-13: qty 10

## 2015-11-13 MED ORDER — ONDANSETRON HCL 4 MG/2ML IJ SOLN
INTRAMUSCULAR | Status: DC | PRN
  Administered 2015-11-13: 4 mg via INTRAVENOUS

## 2015-11-13 SURGICAL SUPPLY — 58 items
ATTRACTOMAT 16X20 MAGNETIC DRP (DRAPES) ×2 IMPLANT
BAG DECANTER FOR FLEXI CONT (MISCELLANEOUS) ×2 IMPLANT
BENZOIN TINCTURE PRP APPL 2/3 (GAUZE/BANDAGES/DRESSINGS) IMPLANT
BLADE SURG 10 STRL SS (BLADE) ×4 IMPLANT
BNDG GAUZE ELAST 4 BULKY (GAUZE/BANDAGES/DRESSINGS) IMPLANT
CANISTER SUCTION 2500CC (MISCELLANEOUS) ×2 IMPLANT
CATH THORACIC 28FR RT ANG (CATHETERS) IMPLANT
CATH THORACIC 36FR (CATHETERS) IMPLANT
CATH THORACIC 36FR RT ANG (CATHETERS) IMPLANT
CLIP TI WIDE RED SMALL 24 (CLIP) IMPLANT
CONN Y 3/8X3/8X3/8  BEN (MISCELLANEOUS) ×1
CONN Y 3/8X3/8X3/8 BEN (MISCELLANEOUS) ×1 IMPLANT
CONT SPEC 4OZ CLIKSEAL STRL BL (MISCELLANEOUS) IMPLANT
DRAPE LAPAROSCOPIC ABDOMINAL (DRAPES) ×2 IMPLANT
DRSG PAD ABDOMINAL 8X10 ST (GAUZE/BANDAGES/DRESSINGS) ×6 IMPLANT
DRSG VAC ATS MED SENSATRAC (GAUZE/BANDAGES/DRESSINGS) ×2 IMPLANT
ELECT REM PT RETURN 9FT ADLT (ELECTROSURGICAL) ×2
ELECTRODE REM PT RTRN 9FT ADLT (ELECTROSURGICAL) ×1 IMPLANT
GAUZE SPONGE 4X4 12PLY STRL (GAUZE/BANDAGES/DRESSINGS) ×2 IMPLANT
GAUZE XEROFORM 5X9 LF (GAUZE/BANDAGES/DRESSINGS) IMPLANT
GLOVE BIOGEL PI IND STRL 6.5 (GLOVE) ×2 IMPLANT
GLOVE BIOGEL PI IND STRL 7.0 (GLOVE) ×1 IMPLANT
GLOVE BIOGEL PI INDICATOR 6.5 (GLOVE) ×2
GLOVE BIOGEL PI INDICATOR 7.0 (GLOVE) ×1
GOWN STRL REUS W/ TWL LRG LVL3 (GOWN DISPOSABLE) ×4 IMPLANT
GOWN STRL REUS W/TWL LRG LVL3 (GOWN DISPOSABLE) ×4
HANDPIECE INTERPULSE COAX TIP (DISPOSABLE) ×1
HEMOSTAT POWDER SURGIFOAM 1G (HEMOSTASIS) IMPLANT
HEMOSTAT SURGICEL 2X14 (HEMOSTASIS) ×2 IMPLANT
KIT BASIN OR (CUSTOM PROCEDURE TRAY) ×2 IMPLANT
KIT ROOM TURNOVER OR (KITS) ×2 IMPLANT
KIT SUCTION CATH 14FR (SUCTIONS) IMPLANT
NS IRRIG 1000ML POUR BTL (IV SOLUTION) ×2 IMPLANT
PACK CHEST (CUSTOM PROCEDURE TRAY) ×2 IMPLANT
PAD ARMBOARD 7.5X6 YLW CONV (MISCELLANEOUS) ×4 IMPLANT
PIN SAFETY STERILE (MISCELLANEOUS) IMPLANT
RUBBERBAND STERILE (MISCELLANEOUS) IMPLANT
SET HNDPC FAN SPRY TIP SCT (DISPOSABLE) ×1 IMPLANT
SOLUTION BETADINE 4OZ (MISCELLANEOUS) IMPLANT
SPONGE LAP 18X18 X RAY DECT (DISPOSABLE) ×2 IMPLANT
STAPLER VISISTAT 35W (STAPLE) IMPLANT
STRAP MONTGOMERY 1.25X11-1/8 (MISCELLANEOUS) IMPLANT
SUT ETHILON 3 0 FSL (SUTURE) IMPLANT
SUT MNCRL AB 4-0 PS2 18 (SUTURE) IMPLANT
SUT PDS AB 1 CTX 36 (SUTURE) ×4 IMPLANT
SUT STEEL 6MS V (SUTURE) IMPLANT
SUT STEEL STERNAL CCS#1 18IN (SUTURE) IMPLANT
SUT STEEL SZ 6 DBL 3X14 BALL (SUTURE) IMPLANT
SUT VIC AB 1 CTX 36 (SUTURE)
SUT VIC AB 1 CTX36XBRD ANBCTR (SUTURE) IMPLANT
SUT VIC AB 2-0 CTX 27 (SUTURE) IMPLANT
SUT VIC AB 3-0 X1 27 (SUTURE) IMPLANT
SWAB COLLECTION DEVICE MRSA (MISCELLANEOUS) IMPLANT
SYSTEM SAHARA CHEST DRAIN ATS (WOUND CARE) ×2 IMPLANT
TOWEL OR 17X24 6PK STRL BLUE (TOWEL DISPOSABLE) ×2 IMPLANT
TOWEL OR 17X26 10 PK STRL BLUE (TOWEL DISPOSABLE) ×2 IMPLANT
TUBE ANAEROBIC SPECIMEN COL (MISCELLANEOUS) IMPLANT
WATER STERILE IRR 1000ML POUR (IV SOLUTION) ×2 IMPLANT

## 2015-11-13 NOTE — Progress Notes (Addendum)
CARDIOTHORACIC SURGERY PROGRESS NOTE   Patient remains afebrile with normal WBC.  All blood and wound cultures remain no growth to date despite obvious purulent drainage from very small punctate hole at inferior end of sternotomy scar.  Mild tenderness on palpation in this area and over right costal margin inferiorly.  This corresponds to mild area of soft tissue density seen on CT scan and presumably must be the source of wound drainage.  I am suspicious that she may have necrotic cartilage.  There is no other obvious explanation and she has clearly failed conservative management.  Will plan wound exploration and debridement in the operating room today.  I have reviewed the indications, risks and potential benefits of surgery with the patient and her husband.  All questions answered.  Rexene Alberts, MD 11/13/2015 8:45am

## 2015-11-13 NOTE — Progress Notes (Signed)
Pharmacy Antibiotic Note  [name] is a 81 y.o. female admitted on 11/10/2015 with wound infection. Purulent drainage from sternotomy scar - TCTS concerned may have necrotic cartilage. For wound exploration and debridement. Pharmacy has been consulted for Vancomycin dosing - day #4. WBC wnl, afebrile, CrCl ~22mL/min. Pt also receiving ciprofloxacin.  Goals:  Vancomycin trough 10-15  Plan:  Vancomycin 1g IV Q24h  F/U c/s, fenal fxn, VT @ss , LOT For OR 3/6  Temp (24hrs), Avg:97.8 F (36.6 C), Min:97.4 F (36.3 C), Max:98 F (36.7 C)   Recent Labs Lab 11/10/15 1155 11/13/15 0709  WBC 9.3 9.3  CREATININE 0.79 0.79    Estimated Creatinine Clearance: 49.1 mL/min (by C-G formula based on Cr of 0.79).    Allergies  Allergen Reactions  . Ace Inhibitors Other (See Comments)    Cough  . Augmentin [Amoxicillin-Pot Clavulanate] Swelling    Swollen joints, pain  . Penicillins Swelling    Swollen joints  . Nifedipine Other (See Comments)    Unknown  . Propranolol Other (See Comments)     Unknown  . Diazepam Other (See Comments)    Made her "hyper"  . Meperidine Other (See Comments)    Made her "hyper"  . Propoxyphene Other (See Comments)    Made her "hyper"    Antimicrobials this admission: 3/2 vanc>> 3/3 Cipro (PTA)>>  Micro: 3/3 blood x2>> ngtd 3/3 Fungus>> 3/3 Sternal wound >> negative 3/3 MRSA PCR: negative  Elicia Lamp, PharmD, [facility] Clinical Pharmacist Pager [phone] 11/13/2015 10:39 AM

## 2015-11-13 NOTE — Anesthesia Preprocedure Evaluation (Addendum)
Anesthesia Evaluation  Patient identified by MRN, date of birth, ID band Patient awake    Reviewed: Allergy & Precautions, NPO status , Patient's Chart, lab work & pertinent test results  History of Anesthesia Complications (+) PONV and history of anesthetic complications  Airway Mallampati: II  TM Distance: >3 FB Neck ROM: Full    Dental  (+) Teeth Intact   Pulmonary COPD, former smoker,     + decreased breath sounds      Cardiovascular hypertension, + CAD and +CHF  + dysrhythmias Atrial Fibrillation + pacemaker  Rhythm:Regular Rate:Normal     Neuro/Psych PSYCHIATRIC DISORDERS Anxiety negative neurological ROS     GI/Hepatic Neg liver ROS, GERD  ,  Endo/Other  Hypothyroidism   Renal/GU negative Renal ROS  negative genitourinary   Musculoskeletal  (+) Arthritis ,   Abdominal   Peds negative pediatric ROS (+)  Hematology   Anesthesia Other Findings   Reproductive/Obstetrics negative OB ROS                            Lab Results  Component Value Date   WBC 9.3 11/13/2015   HGB 10.1* 11/13/2015   HCT 34.2* 11/13/2015   MCV 87.0 11/13/2015   PLT 188 11/13/2015   Lab Results  Component Value Date   CREATININE 0.79 11/13/2015   BUN 16 11/13/2015   NA 140 11/13/2015   K 4.4 11/13/2015   CL 101 11/13/2015   CO2 26 11/13/2015   Lab Results  Component Value Date   INR 1.29 11/13/2015   INR 1.40 11/12/2015   INR 1.74* 11/10/2015   11/2015 EKG: Atrial Paced, SR.  Echo 2017 - Left ventricle: The cavity size was moderately dilated. There was mild concentric hypertrophy. Systolic function was moderately to severely reduced. The estimated ejection fraction was in the range of 30% to 35%. Diffuse hypokinesis. Features are consistent with a pseudonormal left ventricular filling pattern, with concomitant abnormal relaxation and increased filling pressure (grade 2 diastolic  dysfunction). - Mitral valve: Prior procedures included surgical repair and appears to be functioning normally. Mean gradient (D): 3 mm Hg. - Left atrium: The atrium was mildly dilated. - Pulmonary arteries: Systolic pressure was within the normal range.     Anesthesia Physical Anesthesia Plan  ASA: IV  Anesthesia Plan: General   Post-op Pain Management:    Induction: Intravenous  Airway Management Planned: Oral ETT  Additional Equipment:   Intra-op Plan:   Post-operative Plan: Extubation in OR  Informed Consent: I have reviewed the patients History and Physical, chart, labs and discussed the procedure including the risks, benefits and alternatives for the proposed anesthesia with the patient or authorized representative who has indicated his/her understanding and acceptance.   Dental advisory given  Plan Discussed with: CRNA  Anesthesia Plan Comments:         Anesthesia Quick Evaluation

## 2015-11-13 NOTE — Anesthesia Postprocedure Evaluation (Signed)
Anesthesia Post Note  Patient: [name]  Procedure(s) Performed: Procedure(s) (LRB): Excisional drainage of RIGHT Chest wall mass and breast mass  (N/A) APPLICATION OF WOUND VAC (N/A)  Patient location during evaluation: PACU Anesthesia Type: General Level of consciousness: awake and alert Pain management: pain level controlled Vital Signs Assessment: post-procedure vital signs reviewed and stable Respiratory status: spontaneous breathing, nonlabored ventilation, respiratory function stable and patient connected to nasal cannula oxygen Cardiovascular status: blood pressure returned to baseline and stable Postop Assessment: no signs of nausea or vomiting Anesthetic complications: no    Last Vitals:  Filed Vitals:   11/13/15 1545 11/13/15 1600  BP: 126/57 117/61  Pulse: 59 60  Temp:    Resp: 14 14    Last Pain:  Filed Vitals:   11/13/15 1609  PainSc: Puryear Pruitt Taboada

## 2015-11-13 NOTE — Telephone Encounter (Signed)
I left vm for [name] since she called on 11/06/2015 to see if she could return. I read EPIC note from today that mentioned an Infection and Wound debridement so I suggested she return after things get better for her.

## 2015-11-13 NOTE — Op Note (Signed)
CARDIOTHORACIC SURGERY OPERATIVE NOTE  Date of Procedure:   11/13/2015  Preoperative Diagnosis:  Delayed sternal wound infection  Postoperative Diagnosis:  Same, with inflammation and soft tissue mass right costal cartilage and right breast  Procedure:    Excisional debridement of sternal wound, right chest wall and breast mass  Surgeon:    Valentina Gu. 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 Her immediate postoperative course was notable for the development of complete heart block for which she underwent permanent pacemaker placement. She later developed superficial breakdown of the inferior aspect of her sternotomy incision which healed by secondary intent following application of a wound VAC. She was last seen here in our office on 07/10/2015 at which time she was doing well and making satisfactory progress. Her sternotomy incision had essentially completely healed by that time. The patient states that she continued to do fairly well for the next month, and she enrolled in outpatient cardiac rehabilitation program. However, beginning in December she began to develop worsening symptoms of exertional shortness of breath and fatigue. A follow-up echocardiogram was performed in December and notable for significant drop in left ventricular ejection fraction to 30-35%. The mitral valve was functioning normally. She was referred back to Dr. Caryl Comes to consider pacemaker upgrade to a biventricular pacer for  CRT. She was seen in follow-up by Dr. Caryl Comes on 10/03/2015. At that time her pacemaker was reprogrammed to allow for intrinsic AV conduction. Repeat EKG revealed a narrow QRS and the patient reportedly immediately felt much better. Plans for CRT upgrade were deferred and follow-up transthoracic echocardiogram was recommended.   In late January the patient developed soreness in her mid chest and right chest after she suffered a fall while she was at church. She then developed a blister at the inferior aspect of her sternotomy scar. Shortly after that a small amount of drainage began she was evaluated in our office. Wound swab cultures have not grown any organisms. She was treated with oral antibiotics. Drainage increased, and a CT scan was performed 10/16/2015. This did not reveal any findings suggestive of deep sternal wound infection or mediastinitis.  There was a small amount of phlegmon in the midline and a separate masslike density along the inferior margin of the right breast in the right anterior chest wall, which coincided with the area of the patient's increased pain and recent trauma. She was treated with local wound care and empiric oral Ciprofloxacin.  She initially did well and the wound appeared to be healing. However, several days ago the patient began to experience increased tenderness and a dramatic increase in the amount of drainage. She was readmitted to the hospital. Blood cultures and wound cultures all remained no growth. White blood count is normal and the patient remains afebrile. Repeat CT scan reveals no significant changes over the past month and specifically no evidence for deep sternal wound infection, mediastinitis, or retained foreign body.  Options were discussed with the patient and her husband. The indications, risks, and potential benefits of wound exploration  with plans for sternal wound debridement were discussed in detail.    All questions have been answered, and the patient  provides full informed consent for the operation as described.      DETAILS OF THE OPERATIVE PROCEDURE  The patient is brought to the operating room on the above mentioned date and placed in the supine position on the operating table.  Pneumatic sequential compression boots are placed on both lower extremities.  General endotracheal anesthesia is induced uneventfully. Intravenous antibiotics are administered.  The patient's anterior chest and abdomen are prepared and draped in a sterile manner.  A small punctate wound at the inferior margin of the patient's previous sternotomy scar is explored. With palpation along the right inferior anterior chest wall margin purulent material can be expressed through the open punctate wound in the midline. Initially a small linear incision is made and extended down to the xiphoid process. A sinus tract can be appreciated extending laterally overlying the anterior surface of the costal cartilage towards the infra margin of the right breast and the inframammary crease.  This sinus tract was unroofed. The subjacent costal cartilage appears dusky and soft. This costal cartilages debrided. The tract is further extended into an area of chronic inflammation beneath the inframammary crease.  There is chronic inflammation and phlegmon which also appears to involve the inferior margin of the right breast itself. A large amount of this tissue is excised sharply. Portions of the tissue were sent for culture to include routine aerobic, anaerobic, fungal, and AFB cultures.  A portion of the mass was sent to pathology for frozen section histology to rule out breast cancer. Preliminary frozen section histology is notable for the absence of malignant cells and findings only suggestive of chronic fibrous tissue. All of the devitalized tissue in this area is debrided and excised. The wound is carefully explored in all areas to make sure there are no signs of retained foreign body. The  inferior margin of the xiphoid process is debrided. There is no sign of sinus tract extending in any other direction. The wound is irrigated using a pulse lavage irrigation device.  After completion of debridement the wound measures approximately 12 cm in its greatest length stretching from the midline towards the right side. It measures 5 cm in its greatest width and 3 cm in its greatest depth.  A wound vacuum-assisted closure sponges trimmed to an appropriate size and placed in the wound. Negative pressure wound therapy is applied.  The patient tolerated the procedure well, was extubated in the operating room, and transported to the postanesthesia care unit in stable condition. There were no intraoperative complications. Estimated blood loss was trivial. All sponge instrument and needle counts were verified correct.    Valentina Gu. Roxy Manns MD 11/13/2015 2:59 PM

## 2015-11-13 NOTE — Transfer of Care (Signed)
Immediate Anesthesia Transfer of Care Note  Patient: [name]  Procedure(s) Performed: Procedure(s): Excisional drainage of RIGHT Chest wall mass and breast mass  (N/A) APPLICATION OF WOUND VAC (N/A)  Patient Location: PACU  Anesthesia Type:General  Level of Consciousness: awake and alert   Airway & Oxygen Therapy: Patient Spontanous Breathing and Patient connected to face mask oxygen  Post-op Assessment: Report given to RN, Post -op Vital signs reviewed and stable and Patient moving all extremities  Post vital signs: Reviewed and stable  Last Vitals:  Filed Vitals:   11/12/15 2136 11/13/15 0600  BP: 134/37 118/42  Pulse: 62 62  Temp: 36.7 C 36.3 C  Resp: 16 20    Complications: No apparent anesthesia complications

## 2015-11-13 NOTE — Brief Op Note (Addendum)
11/10/2015 - 11/13/2015  2:56 PM  PATIENT:  [name]  81 y.o. female  PRE-OPERATIVE DIAGNOSIS:  STERNAL WOUND INFECTION  POST-OPERATIVE DIAGNOSIS:  STERNAL WOUND INFECTION  PROCEDURE:  Procedure(s): Excisional drainage of RIGHT Chest wall mass and breast mass  (N/A) APPLICATION OF WOUND VAC (N/A)  SURGEON:  Surgeon(s) and Role:    * Rexene Alberts, MD - Primary  PHYSICIAN ASSISTANT:   ASSISTANTS: Genie Bantell   ANESTHESIA:   general  EBL:  trivial  BLOOD ADMINISTERED:none  DRAINS: none   WOUND CLOSURE:  Negative pressure wound therapy (VAC)  SPECIMEN:  Source of Specimen:  right chest wall mass and breast mass for histology and culture  DISPOSITION OF SPECIMEN:  PATHOLOGY  COUNTS:  YES  DICTATION: .Note written in EPIC  PLAN OF CARE: Admit to inpatient   PATIENT DISPOSITION:  PACU - hemodynamically stable.   Delay start of Pharmacological VTE agent (>24hrs) due to surgical blood loss or risk of bleeding: no  Rexene Alberts, MD 11/13/2015 2:58 PM

## 2015-11-13 NOTE — Care Management Important Message (Signed)
Important Message  Patient Details  Name: [name] MRN: [id number] Date of Birth: 11/06/1934   Medicare Important Message Given:  Yes    Nathen May 11/13/2015, 2:37 PM

## 2015-11-13 NOTE — Anesthesia Procedure Notes (Signed)
Procedure Name: Intubation Date/Time: 11/13/2015 1:50 PM Performed by: Suzy Bouchard Pre-anesthesia Checklist: Patient identified, Emergency Drugs available, Suction available, Timeout performed and Patient being monitored Patient Re-evaluated:Patient Re-evaluated prior to inductionOxygen Delivery Method: Circle system utilized Preoxygenation: Pre-oxygenation with 100% oxygen Intubation Type: IV induction Ventilation: Mask ventilation without difficulty Laryngoscope Size: Miller and 2 Grade View: Grade II Tube type: Oral Laser Tube: Cuffed inflated with minimal occlusive pressure - saline Tube size: 7.0 mm Number of attempts: 1 Airway Equipment and Method: Stylet Placement Confirmation: ETT inserted through vocal cords under direct vision,  positive ETCO2 and breath sounds checked- equal and bilateral Tube secured with: Tape Dental Injury: Teeth and Oropharynx as per pre-operative assessment

## 2015-11-13 NOTE — Progress Notes (Signed)
Cardiac Individual Treatment Plan  Patient Details  Name: [name] MRN: [id number] Date of Birth: 03-09-1935 Referring Provider:  No ref. provider found  Initial Encounter Date:    Visit Diagnosis: No diagnosis found.  Patient's Home Medications on Admission: No current facility-administered medications for this visit. No current outpatient prescriptions on file.  Facility-Administered Medications Ordered in Other Visits:  .  [MAR Hold] 0.9 %  sodium chloride infusion, 250 mL, Intravenous, PRN, John Giovanni, PA-C .  [MAR Hold] acetaminophen (TYLENOL) tablet 650 mg, 650 mg, Oral, Q6H PRN **OR** [MAR Hold] acetaminophen (TYLENOL) suppository 650 mg, 650 mg, Rectal, Q6H PRN, Wayne E Gold, PA-C .  [MAR Hold] aspirin EC tablet 81 mg, 81 mg, Oral, Daily, Wayne E Gold, PA-C, 81 mg at 11/13/15 1015 .  [MAR Hold] benzonatate (TESSALON) capsule 100 mg, 100 mg, Oral, TID PRN, John Giovanni, PA-C .  [MAR Hold] bisoprolol (ZEBETA) tablet 10 mg, 10 mg, Oral, Daily, Wayne E Gold, PA-C, 10 mg at 11/13/15 1015 .  [MAR Hold] ciprofloxacin (CIPRO) tablet 500 mg, 500 mg, Oral, BID, Wayne E Gold, PA-C, 500 mg at 11/13/15 0734 .  [MAR Hold] docusate sodium (COLACE) capsule 100 mg, 100 mg, Oral, BID, Wayne E Gold, PA-C, 100 mg at 11/13/15 1016 .  ePHEDrine injection, , , Anesthesia Intra-op, Suzy Bouchard, CRNA, 5 mg at 11/13/15 1357 .  fentaNYL (SUBLIMAZE) injection, , , Anesthesia Intra-op, Suzy Bouchard, CRNA, 50 mcg at 11/13/15 1410 .  [MAR Hold] fluticasone (FLONASE) 50 MCG/ACT nasal spray 1 spray, 1 spray, Each Nare, Daily PRN, Wayne E Gold, PA-C .  [MAR Hold] furosemide (LASIX) tablet 20 mg, 20 mg, Oral, BID, Wayne E Gold, PA-C, 20 mg at 11/13/15 0734 .  [MAR Hold] gabapentin (NEURONTIN) capsule 100 mg, 100 mg, Oral, BID, Wayne E Gold, PA-C, 100 mg at 11/13/15 1015 .  [MAR Hold] ipratropium-albuterol (DUONEB) 0.5-2.5 (3) MG/3ML nebulizer solution 3 mL, 3 mL, Nebulization, Q6H PRN, Wayne E Gold,  PA-C .  lactated ringers infusion, , Intravenous, Continuous, Effie Berkshire, MD, Last Rate: 10 mL/hr at 11/13/15 1223 .  lactated ringers infusion, , , Continuous PRN, Suzy Bouchard, CRNA .  [MAR Hold] levothyroxine (SYNTHROID, LEVOTHROID) tablet 88 mcg, 88 mcg, Oral, QAC breakfast, Wayne E Gold, PA-C, 88 mcg at 11/13/15 0734 .  lidocaine (cardiac) 100 mg/29m (XYLOCAINE) 20 MG/ML injection 2%, , , Anesthesia Intra-op, ASuzy Bouchard CRNA, 60 mg at 11/13/15 1349 .  [MAR Hold] losartan (COZAAR) tablet 100 mg, 100 mg, Oral, Daily, Wayne E Gold, PA-C, 100 mg at 11/13/15 1015 .  [MAR Hold] magnesium hydroxide (MILK OF MAGNESIA) suspension 30 mL, 30 mL, Oral, Daily PRN, Wayne E Gold, PA-C, 30 mL at 11/11/15 2009 .  midazolam (VERSED) 5 MG/5ML injection, , , Anesthesia Intra-op, ASuzy Bouchard CRNA, 1 mg at 11/13/15 1410 .  [MAR Hold] ondansetron (ZOFRAN) tablet 4 mg, 4 mg, Oral, Q6H PRN **OR** [MAR Hold] ondansetron (ZOFRAN) injection 4 mg, 4 mg, Intravenous, Q6H PRN, Wayne E Gold, PA-C .  [MAR Hold] oxyCODONE (Oxy IR/ROXICODONE) immediate release tablet 5 mg, 5 mg, Oral, Q4H PRN, Wayne E Gold, PA-C, 5 mg at 11/12/15 2118 .  [MAR Hold] pantoprazole (PROTONIX) EC tablet 40 mg, 40 mg, Oral, BID, Wayne E Gold, PA-C, 40 mg at 11/13/15 1015 .  phenylephrine (NEO-SYNEPHRINE) 0.04 mg/mL in dextrose 5 % 250 mL infusion, 10 mg, , Continuous PRN, ASuzy Bouchard CRNA, Last Rate: 37.5 mL/hr at 11/13/15  1402, 25 mcg/min at 11/13/15 1402 .  phenylephrine (NEO-SYNEPHRINE) injection, , , Anesthesia Intra-op, Suzy Bouchard, CRNA, 40 mcg at 11/13/15 1357 .  [MAR Hold] potassium chloride SA (K-DUR,KLOR-CON) CR tablet 20 mEq, 20 mEq, Oral, Daily, Wayne E Gold, PA-C, 20 mEq at 11/13/15 1016 .  propofol (DIPRIVAN) 10 mg/mL bolus/IV push, , , Anesthesia Intra-op, Suzy Bouchard, CRNA, 30 mg at 11/13/15 1410 .  [MAR Hold] simvastatin (ZOCOR) tablet 20 mg, 20 mg, Oral, Daily, Wayne E Gold, PA-C, 20 mg at 11/12/15  1001 .  [MAR Hold] sodium chloride flush (NS) 0.9 % injection 3 mL, 3 mL, Intravenous, Q12H, Wayne E Gold, PA-C, 3 mL at 11/12/15 1002 .  [MAR Hold] sodium chloride flush (NS) 0.9 % injection 3 mL, 3 mL, Intravenous, Q12H, Wayne E Gold, PA-C, 3 mL at 11/13/15 1016 .  [MAR Hold] sodium chloride flush (NS) 0.9 % injection 3 mL, 3 mL, Intravenous, PRN, Wayne E Gold, PA-C .  sodium chloride irrigation 0.9 %, , , PRN, Rexene Alberts, MD, 1,000 mL at 11/13/15 1332 .  [MAR Hold] spironolactone (ALDACTONE) tablet 25 mg, 25 mg, Oral, Daily, Wayne E Gold, PA-C, 25 mg at 11/13/15 1016 .  succinylcholine (ANECTINE) injection, , , Anesthesia Intra-op, Suzy Bouchard, CRNA, 100 mg at 11/13/15 1349 .  [MAR Hold] vancomycin (VANCOCIN) IVPB 1000 mg/200 mL premix, 1,000 mg, Intravenous, Q24H, Meagan C Miles, RPH, 1,000 mg at 11/13/15 0243  Past Medical History: Past Medical History  Diagnosis Date  . Hypertension   . COPD (chronic obstructive pulmonary disease) ([place])   . Hyperlipidemia   . Meniere disease   . PAF (paroxysmal atrial fibrillation) ([place])   . COPD (chronic obstructive pulmonary disease) ([place])   . Hypothyroidism   . Pulmonary hypertension ([place])   . Chronic diastolic CHF (congestive heart failure) ([place])   . PONV (postoperative nausea and vomiting)   . Anxiety   . GERD (gastroesophageal reflux disease)   . Severe Mitral Regurgitation s/p MV Repair     a. 04/2015 s/p 27 mm [facility] Mitral bovine bioprosthetic tissue valve  . Tricuspid Regurgitation s/p Repair     a. 04/2015 s/p 26 mm Edwards mc3 ring annuloplasty  . 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 04/2015 Complete bilateral atrial lesion set using cryothermy and bipolar radiofrequency ablation with clipping of LA appendage  . CAD s/p CABG x 1     a. 04/2015 LIMA to LAD  . Post-surgical complete heart block, symptomatic     a. 04/2015 s/p MDT [id number] Advisa DR MRI DC PPM.  . Wound infection ([place]) 10/13/2015    Superficial sternal  wound infection  . Cancer of right breast ([place]) 08/09/2001  . Presence of permanent cardiac pacemaker   . On home oxygen therapy     "2L at night" (11/10/2015)  . Pneumonia ~ 2010  . Arthritis     "hands" (11/10/2015)    Tobacco Use: History  Smoking status  . Former Smoker -- 1.50 packs/day for 40 years  . Types: Cigarettes  . Quit date: 04/20/1998  Smokeless tobacco  . Never Used    Labs: Recent Review Flowsheet Data    Labs for ITP Cardiac and Pulmonary Rehab Latest Ref Rng 04/27/2015 04/28/2015 04/29/2015 04/30/2015 11/13/2015   PHART 7.350 - 7.450 - - - - 7.428   PCO2ART 35.0 - 45.0 mmHg - - - - 41.1   HCO3 20.0 - 24.0 mEq/L - - - -  26.7(H)   TCO2 0 - 100 mmol/L - - 34 34 27.9   O2SAT - 68.5 65.3 61.7 - 95.2       Exercise Target Goals:    Exercise Program Goal: Individual exercise prescription set with THRR, safety & activity barriers. Participant demonstrates ability to understand and report RPE using BORG scale, to self-measure pulse accurately, and to acknowledge the importance of the exercise prescription.  Exercise Prescription Goal: Starting with aerobic activity 30 plus minutes a day, 3 days per week for initial exercise prescription. Provide home exercise prescription and guidelines that participant acknowledges understanding prior to discharge.  Activity Barriers & Risk Stratification:     Activity Barriers & Cardiac Risk Stratification - 07/03/15 1335    Activity Barriers & Cardiac Risk Stratification   Activity Barriers Balance Concerns;Deconditioning;Joint Problems  Meineres disease= balance concerns, bursitis left hip and leg, sternal open wound   Cardiac Risk Stratification Moderate      6 Minute Walk:     6 Minute Walk      07/03/15 1424       6 Minute Walk   Phase Initial     Distance 450 feet  NS Test: 450 steps     Walk Time 6 minutes     RPE 11     Symptoms No     Resting HR 69 bpm     Resting BP 132/80 mmHg     Max Ex. HR 84 bpm      Max Ex. BP 138/74 mmHg        Initial Exercise Prescription:     Initial Exercise Prescription - 07/03/15 1400    Date of Initial Exercise Prescription   Date 07/03/15   Bike   Level 0.5   Minutes 15   Recumbant Bike   Level 2   RPM 30   Watts 20   Minutes 15   NuStep   Level 2   Watts 30   Minutes 15   Cybex   Level 1   RPM 30   Minutes 15   Recumbant Elliptical   Level 1   Watts 20   Minutes 15   REL-XR   Level 2   Watts 30   Minutes 15   Prescription Details   Frequency (times per week) 3   Duration Progress to 30 minutes of continuous aerobic without signs/symptoms of physical distress   Intensity   THRR REST +  30   Progression   Progression Continue progressive overload as per policy without signs/symptoms or physical distress.  no upper body exercise due to sternal wound precautions   Resistance Training   Training Prescription No  ROM only due to sternal wound precautions      Exercise Prescription Changes:     Exercise Prescription Changes      07/11/15 0600 07/21/15 0900 07/26/15 0800 08/07/15 1500 08/09/15 0750   Exercise Review   Progression No  Has not started program yet; med review was 07/03/15 No  Has not started program yet; med review was 07/03/15 Yes  Has not started program yet; med review was 07/03/15 Yes No   Response to Exercise   Blood Pressure (Admit)    108/62 mmHg 116/64 mmHg   Blood Pressure (Exercise)     118/64 mmHg   Blood Pressure (Exit)    112/70 mmHg 108/56 mmHg   Heart Rate (Admit)    76 bpm 76 bpm   Heart Rate (Exercise)    101 bpm 105  bpm   Heart Rate (Exit)    71 bpm 73 bpm   Rating of Perceived Exertion (Exercise)    13 13   Symptoms    None None   Comments    Kent is getting stronger and is especially encouraged on the treadmill. She can now walk for 20 minutes without stopping. She can walk into exercise class without oxygen now too and she was very proud of that.  Janaye has been out for almost a month and will  continue to be out due to illness and medical reasons. No more exercise progression will be made and we will meet with her individually to adjust workloads based on her exercise capacity when she returns    Duration    Progress to 50 minutes of aerobic without signs/symptoms of physical distress Progress to 50 minutes of aerobic without signs/symptoms of physical distress   Intensity    Rest + 30 Rest + 30   Progression   Progression    Continue progressive overload as per policy without signs/symptoms or physical distress. Continue progressive overload as per policy without signs/symptoms or physical distress.   Resistance Training   Training Prescription (read-only)   Yes Yes Yes   Weight (read-only)   2 2 2    Reps (read-only)   10-15 10-15 10-15   Treadmill   MPH (read-only)    2 2   Grade (read-only)    0 0   Minutes (read-only)    20 20   REL-XR   Level (read-only)  3 5 5 5    Watts (read-only)  45 16 45 45   Minutes (read-only)    15 15     09/26/15 0800 10/23/15 1100         Exercise Review   Progression No  Absent since last review; last visit 08/09/15 No  Absent since last review; last visit 08/09/15      Response to Exercise   Comments Ex. Rx. will be re evaluated upon return due to extended absence Ex. Rx. will be re evaluated upon return due to extended absence      Duration Progress to 50 minutes of aerobic without signs/symptoms of physical distress Progress to 50 minutes of aerobic without signs/symptoms of physical distress      Intensity Rest + 30 Rest + 30      Progression   Progression Continue progressive overload as per policy without signs/symptoms or physical distress. Continue progressive overload as per policy without signs/symptoms or physical distress.      Resistance Training   Training Prescription (read-only) Yes Yes      Weight (read-only) 2 2      Reps (read-only) 10-15 10-15      Treadmill   MPH (read-only) 2 2      Grade (read-only) 0 0       Minutes (read-only) 20 20      REL-XR   Level (read-only) 5 5      Watts (read-only) 45 45      Minutes (read-only) 15 15         Discharge Exercise Prescription (Final Exercise Prescription Changes):     Exercise Prescription Changes - 10/23/15 1100    Exercise Review   Progression No  Absent since last review; last visit 08/09/15   Response to Exercise   Comments Ex. Rx. will be re evaluated upon return due to extended absence   Duration Progress to 50 minutes of aerobic without signs/symptoms of physical  distress   Intensity Rest + 30   Progression   Progression Continue progressive overload as per policy without signs/symptoms or physical distress.   Resistance Training   Training Prescription (read-only) Yes   Weight (read-only) 2   Reps (read-only) 10-15   Treadmill   MPH (read-only) 2   Grade (read-only) 0   Minutes (read-only) 20   REL-XR   Level (read-only) 5   Watts (read-only) 45   Minutes (read-only) 15      Nutrition:  Target Goals: Understanding of nutrition guidelines, daily intake of sodium <1576m, cholesterol <208m calories 30% from fat and 7% or less from saturated fats, daily to have 5 or more servings of fruits and vegetables.  Biometrics:     Pre Biometrics - 07/03/15 1431    Pre Biometrics   Height 5' 1"  (1.549 m)   Weight 136 lb 8 oz (61.916 kg)   Waist Circumference 34.5 inches   Hip Circumference 38.5 inches   Waist to Hip Ratio 0.9 %   BMI (Calculated) 25.8       Nutrition Therapy Plan and Nutrition Goals:   Nutrition Discharge: Rate Your Plate Scores:   Nutrition Goals Re-Evaluation:   Psychosocial: Target Goals: Acknowledge presence or absence of depression, maximize coping skills, provide positive support system. Participant is able to verbalize types and ability to use techniques and skills needed for reducing stress and depression.  Initial Review & Psychosocial Screening:     Initial Psych Review & Screening -  07/03/15 1342    Family Dynamics   Good Support System? Yes   Barriers   Psychosocial barriers to participate in program There are no identifiable barriers or psychosocial needs.;The patient should benefit from training in stress management and relaxation.   Screening Interventions   Interventions Encouraged to exercise      Quality of Life Scores:     Quality of Life - 07/03/15 1446    Quality of Life Scores   Health/Function Pre 22.62 %   Socioeconomic Pre 29.58 %   Psych/Spiritual Pre 30 %   Family Pre 30 %   GLOBAL Pre 26.6 %      PHQ-9:     Recent Review Flowsheet Data    [facility]/9 07/03/2015   Decreased Interest 0   Down, Depressed, Hopeless 0   PHQ - 2 Score 0   Altered sleeping 0   Tired, decreased energy 3   Change in appetite 0   Feeling bad or failure about yourself  0   Trouble concentrating 0   Moving slowly or fidgety/restless 0   Suicidal thoughts 0   PHQ-9 Score 3   Difficult doing work/chores Somewhat difficult      Psychosocial Evaluation and Intervention:     Psychosocial Evaluation - 07/12/15 0936    Psychosocial Evaluation & Interventions   Interventions Stress management education;Encouraged to exercise with the program and follow exercise prescription;Relaxation education   Comments Counselor met with Ms. Magallanes today for initial psychosocial evaluation.  It is her first day in Cardiac Rehab and she was energetic and working hard already!  Ms. MuOsbourns a [id number] old who had open heart surgery in August followed by subsequent procedures and an infection resulting in 5 weeks in the hospital.  She has a strong support system with a spouse of [id number] and several siblings who live closeby.  Ms. MuMooerseports that she sleeps well and typically has a good appetite.  She denies a history  of depression or anxiety or any current symptoms.  She states her mood is generally positive and her doctor has released her to return to "normal  activities" with pacing herself.  Ms. Stavola has goals in this program to increase her strength and stamina and to improve her balance - as she occasionally has to use a walker.  She will benefit from the consistent exercise aspect of this program.     Continued Psychosocial Services Needed No      Psychosocial Re-Evaluation:     Psychosocial Re-Evaluation      07/24/15 0940 11/13/15 1422         Psychosocial Re-Evaluation   Comments Counselor follow up with Ms. Pongratz today reporting she has better balance now and is no longer using her walker at home at all.  She brings it to this program just to carry her oxygen tank.  She states she is experiencing more stamina as well and able to walk and move more and for longer periods of time.  She continues to sleep well, eat well and have a positive attitude.  Counselor commended Ms. Wildermuth for all her hard work and progress made so far.   I left vm for Doris since she called on 11/06/2015 to see if she could return. I read EPIC note from today that mentioned an Infection and Wound debridement so I suggested she return after things get better for her.         Vocational Rehabilitation: Provide vocational rehab assistance to qualifying candidates.   Vocational Rehab Evaluation & Intervention:     Vocational Rehab - 07/03/15 1337    Initial Vocational Rehab Evaluation & Intervention   Assessment shows need for Vocational Rehabilitation No      Education: Education Goals: Education classes will be provided on a weekly basis, covering required topics. Participant will state understanding/return demonstration of topics presented.  Learning Barriers/Preferences:     Learning Barriers/Preferences - 07/03/15 1336    Learning Barriers/Preferences   Learning Barriers Hearing   Learning Preferences Video      Education Topics: General Nutrition Guidelines/Fats and Fiber: -Group instruction provided by verbal, written material, models and  posters to present the general guidelines for heart healthy nutrition. Gives an explanation and review of dietary fats and fiber.   Controlling Sodium/Reading Food Labels: -Group verbal and written material supporting the discussion of sodium use in heart healthy nutrition. Review and explanation with models, verbal and written materials for utilization of the food label.   Exercise Physiology & Risk Factors: - Group verbal and written instruction with models to review the exercise physiology of the cardiovascular system and associated critical values. Details cardiovascular disease risk factors and the goals associated with each risk factor.          Cardiac Rehab from 08/09/2015 in [facility] Cardiac and Pulmonary Rehab   Date  07/12/15   Educator  RM   Instruction Review Code  2- meets goals/outcomes      Aerobic Exercise & Resistance Training: - Gives group verbal and written discussion on the health impact of inactivity. On the components of aerobic and resistive training programs and the benefits of this training and how to safely progress through these programs.      Cardiac Rehab from 08/09/2015 in [facility] Cardiac and Pulmonary Rehab   Date  07/17/15   Educator  RM   Instruction Review Code  2- meets goals/outcomes      Flexibility, Balance, General Exercise Guidelines: - Provides  group verbal and written instruction on the benefits of flexibility and balance training programs. Provides general exercise guidelines with specific guidelines to those with heart or lung disease. Demonstration and skill practice provided.      Cardiac Rehab from 08/09/2015 in [facility] Cardiac and Pulmonary Rehab   Date  07/24/15   Educator  RM   Instruction Review Code  2- meets goals/outcomes      Stress Management: - Provides group verbal and written instruction about the health risks of elevated stress, cause of high stress, and healthy ways to reduce stress.      Cardiac Rehab from 08/09/2015 in [facility]  Cardiac and Pulmonary Rehab   Date  07/26/15   Educator  [facility]   Instruction Review Code  2- meets goals/outcomes      Depression: - Provides group verbal and written instruction on the correlation between heart/lung disease and depressed mood, treatment options, and the stigmas associated with seeking treatment.   Anatomy & Physiology of the Heart: - Group verbal and written instruction and models provide basic cardiac anatomy and physiology, with the coronary electrical and arterial systems. Review of: AMI, Angina, Valve disease, Heart Failure, Cardiac Arrhythmia, Pacemakers, and the ICD.   Cardiac Procedures: - Group verbal and written instruction and models to describe the testing methods done to diagnose heart disease. Reviews the outcomes of the test results. Describes the treatment choices: Medical Management, Angioplasty, or Coronary Bypass Surgery.   Cardiac Medications: - Group verbal and written instruction to review commonly prescribed medications for heart disease. Reviews the medication, class of the drug, and side effects. Includes the steps to properly store meds and maintain the prescription regimen.   Go Sex-Intimacy & Heart Disease, Get SMART - Goal Setting: - Group verbal and written instruction through game format to discuss heart disease and the return to sexual intimacy. Provides group verbal and written material to discuss and apply goal setting through the application of the S.M.A.R.T. Method.   Other Matters of the Heart: - Provides group verbal, written materials and models to describe Heart Failure, Angina, Valve Disease, and Diabetes in the realm of heart disease. Includes description of the disease process and treatment options available to the cardiac patient.      Cardiac Rehab from 08/09/2015 in [facility] Cardiac and Pulmonary Rehab   Date  08/09/15   Educator  SB   Instruction Review Code  2- meets goals/outcomes      Exercise & Equipment Safety: -  Individual verbal instruction and demonstration of equipment use and safety with use of the equipment.      Cardiac Rehab from 08/09/2015 in [facility] Cardiac and Pulmonary Rehab   Date  07/03/15   Educator  SB   Instruction Review Code  2- meets goals/outcomes      Infection Prevention: - Provides verbal and written material to individual with discussion of infection control including proper hand washing and proper equipment cleaning during exercise session.      Cardiac Rehab from 08/09/2015 in [facility] Cardiac and Pulmonary Rehab   Date  07/03/15   Educator  Sb   Instruction Review Code  2- meets goals/outcomes      Falls Prevention: - Provides verbal and written material to individual with discussion of falls prevention and safety.      Cardiac Rehab from 08/09/2015 in [facility] Cardiac and Pulmonary Rehab   Date  07/03/15   Educator  Sb   Instruction Review Code  2- meets goals/outcomes  Diabetes: - Individual verbal and written instruction to review signs/symptoms of diabetes, desired ranges of glucose level fasting, after meals and with exercise. Advice that pre and post exercise glucose checks will be done for 3 sessions at entry of program.    Knowledge Questionnaire Score:     Knowledge Questionnaire Score - 07/03/15 1440    Knowledge Questionnaire Score   Pre Score 26/28      Personal Goals and Risk Factors at Admission:     Personal Goals and Risk Factors at Admission - 07/03/15 1339    Core Components/Risk Factors/Patient Goals on Admission   Sedentary Yes;Sedentary  Balance concerns ,using walker for ambulation.   Intervention (read-only) While in program, learn and follow the exercise prescription taught. Start at a low level workload and increase workload after able to maintain previous level for 30 minutes. Increase time before increasing intensity.   Intervention (read-only) Provide exercise education and an individualized exercise prescription that will  provide continued progressive overload as per policy without signs/symptoms of physical distress.   Diabetes No   Hypertension Yes   Goal Participant will see blood pressure controlled within the values of 140/37m/Hg or within value directed by their physician.   Intervention (read-only) Provide nutrition & aerobic exercise along with prescribed medications to achieve BP 140/90 or less.   Lipids Yes   Goal Cholesterol controlled with medications as prescribed, with individualized exercise RX and with personalized nutrition plan. Value goals: LDL < 777m HDL > 4022mParticipant states understanding of desired cholesterol values and following prescriptions.   Intervention (read-only) Provide nutrition & aerobic exercise along with prescribed medications to achieve LDL <105m85mDL >40mg7m   Personal Goals and Risk Factors Review:      Goals and Risk Factor Review      07/17/15 1007 08/02/15 1511 09/26/15 0826 10/23/15 1108 11/13/15 1422   Core Components/Risk Factors/Patient Goals Review   Personal Goals Review Increase Aerobic Exercise and Physical Activity Increase Aerobic Exercise and Physical Activity;Hypertension;Lipids      Review     I left vm for Wrenn Yailinee she called on 11/06/2015 to see if she could return. I read EPIC note from today that mentioned an Infection and Wound debridement so I suggested she return after things get better for her.   Increase Aerobic Exercise and Physical Activity (read-only)   Goals Progress/Improvement seen   Yes No No    Comments Patient discussed her desire to get back to "normal" and was talking about some of the exercise she used to do before he heart episode. She said that after her first week of class she was a little sore. We discussed that that can be a normal response of the body when a new activity is preformed, and how over time the soreness could go away once he muscles get stronger and used to the new activity. Education on cross training/ the  body's response to new activities was given.  Abbagale Jamayaed today she can bend down and tie her shoes.  Today she was having a flare of her bursitis in her leg. No treadmill time because of the bursitis pain.  She is walking more at home and her husband bought her a cane to help her stay balanced. We talked about the distance it is to walk from the hosptal entrance to the rehab gym. Her husband wheels her down. Her goal is to be able to walk from the class to the front door without wheelchair assistance  by Dec 19th.  Ex. Rx. will be re-evaluated upon return due to extende absence Ex. Rx. will be re-evaluated upon return due to extende absence    Hypertension (read-only)   Progress seen toward goals  Yes      Comments  Viridiana's BP remains in normal range. She will see RD in Dec on the 19th to review nutrition plan.       Abnormal Lipids (read-only)   Progress seen towards goals  Unknown      Comments  No labs to review. Feiga will have an appointment with the RD in December.          Personal Goals Discharge (Final Personal Goals and Risk Factors Review):      Goals and Risk Factor Review - 11/13/15 1422    Core Components/Risk Factors/Patient Goals Review   Review I left vm for Lenni since she called on 11/06/2015 to see if she could return. I read EPIC note from today that mentioned an Infection and Wound debridement so I suggested she return after things get better for her.      ITP Comments:     ITP Comments      08/14/15 1211 09/10/15 1215 10/05/15 1131 10/31/15 0751 11/13/15 1422   ITP Comments 30 day review preparation: Continue with ITP. Ready for 30 day review.  Continue with ITP  Remains out with medical concerns.  Has appoinment mid JAn with cardiologist regarding pacemaker qustions. Ready for 30 day review. Continue with ITP. Remains out with medical reasons 30 day review.  Continue with ITP. Continues to remain out for medical reasons I left vm for Rabia since she called on 11/06/2015 to  see if she could return. I read EPIC note from today that mentioned an Infection and Wound debridement so I suggested she return after things get better for her.      Comments: I left vm for Phynix since she called on 11/06/2015 to see if she could return. I read EPIC note from today that mentioned an Infection and Wound debridement so I suggested she return after things get better for her.

## 2015-11-14 ENCOUNTER — Encounter (HOSPITAL_COMMUNITY): Payer: Self-pay | Admitting: Thoracic Surgery (Cardiothoracic Vascular Surgery)

## 2015-11-14 ENCOUNTER — Inpatient Hospital Stay (HOSPITAL_COMMUNITY): Payer: Medicare Other

## 2015-11-14 DIAGNOSIS — Z951 Presence of aortocoronary bypass graft: Secondary | ICD-10-CM

## 2015-11-14 DIAGNOSIS — T814XXD Infection following a procedure, subsequent encounter: Secondary | ICD-10-CM

## 2015-11-14 DIAGNOSIS — B9689 Other specified bacterial agents as the cause of diseases classified elsewhere: Secondary | ICD-10-CM

## 2015-11-14 DIAGNOSIS — Z950 Presence of cardiac pacemaker: Secondary | ICD-10-CM

## 2015-11-14 DIAGNOSIS — L0889 Other specified local infections of the skin and subcutaneous tissue: Secondary | ICD-10-CM

## 2015-11-14 DIAGNOSIS — Z952 Presence of prosthetic heart valve: Secondary | ICD-10-CM

## 2015-11-14 DIAGNOSIS — Y838 Other surgical procedures as the cause of abnormal reaction of the patient, or of later complication, without mention of misadventure at the time of the procedure: Secondary | ICD-10-CM

## 2015-11-14 LAB — CBC
HEMATOCRIT: 32.3 % — AB (ref 36.0–46.0)
Hemoglobin: 9.5 g/dL — ABNORMAL LOW (ref 12.0–15.0)
MCH: 25.8 pg — ABNORMAL LOW (ref 26.0–34.0)
MCHC: 29.4 g/dL — ABNORMAL LOW (ref 30.0–36.0)
MCV: 87.8 fL (ref 78.0–100.0)
PLATELETS: 182 10*3/uL (ref 150–400)
RBC: 3.68 MIL/uL — ABNORMAL LOW (ref 3.87–5.11)
RDW: 20.1 % — AB (ref 11.5–15.5)
WBC: 9.6 10*3/uL (ref 4.0–10.5)

## 2015-11-14 LAB — BASIC METABOLIC PANEL
Anion gap: 10 (ref 5–15)
BUN: 11 mg/dL (ref 6–20)
CALCIUM: 9 mg/dL (ref 8.9–10.3)
CO2: 32 mmol/L (ref 22–32)
CREATININE: 0.81 mg/dL (ref 0.44–1.00)
Chloride: 97 mmol/L — ABNORMAL LOW (ref 101–111)
GFR calc Af Amer: >60 mL/min (ref >60)
GLUCOSE: 142 mg/dL — AB (ref 65–99)
Potassium: 4.1 mmol/L (ref 3.5–5.1)
Sodium: 139 mmol/L (ref 135–145)

## 2015-11-14 LAB — ACID FAST SMEAR (AFB, MYCOBACTERIA): Acid Fast Smear: NEGATIVE

## 2015-11-14 LAB — PREALBUMIN: Prealbumin: 19 mg/dL (ref 18–38)

## 2015-11-14 LAB — BLOOD GAS, ARTERIAL
ACID-BASE EXCESS: 7 mmol/L — AB (ref 0.0–2.0)
BICARBONATE: 31.5 meq/L — AB (ref 20.0–24.0)
Drawn by: 413081
O2 Content: 2 L/min
O2 SAT: 98.4 %
PATIENT TEMPERATURE: 98.6
PO2 ART: 109 mmHg — AB (ref 80.0–100.0)
TCO2: 33 mmol/L (ref 0–100)
pCO2 arterial: 49.4 mmHg — ABNORMAL HIGH (ref 35.0–45.0)
pH, Arterial: 7.42 (ref 7.350–7.450)

## 2015-11-14 MED ORDER — SODIUM CHLORIDE 0.9% FLUSH
10.0000 mL | INTRAVENOUS | Status: DC | PRN
Start: 2015-11-14 — End: 2015-11-27
  Administered 2015-11-15 – 2015-11-22 (×8): 10 mL
  Filled 2015-11-14 (×8): qty 40

## 2015-11-14 MED ORDER — DEXTROSE 5 % IV SOLN
2.0000 g | Freq: Two times a day (BID) | INTRAVENOUS | Status: AC
  Administered 2015-11-14 – 2015-11-25 (×21): 2 g via INTRAVENOUS
  Filled 2015-11-14 (×23): qty 2

## 2015-11-14 NOTE — Consult Note (Addendum)
WOC consult requested for Vac change assistance on Wed.  Discussed plan of care with Dr Roxy Manns, who plans to contact Dr Marla Roe of the plastics team to assess the wound.  Vac intact with good seal to full thickness post-op chest wound with cont suction.  Scant amt pink drainage in the cannister.  Please contact me if dressing change assistance is desired at a certain time on Wed.  Briscoe Deutscher [place], [place]

## 2015-11-14 NOTE — Progress Notes (Signed)
Peripherally Inserted Central Catheter/Midline Placement  The IV Nurse has discussed with the patient and/or persons authorized to consent for the patient, the purpose of this procedure and the potential benefits and risks involved with this procedure.  The benefits include less needle sticks, lab draws from the catheter and patient may be discharged home with the catheter.  Risks include, but not limited to, infection, bleeding, blood clot (thrombus formation), and puncture of an artery; nerve damage and irregular heat beat.  Alternatives to this procedure were also discussed.  PICC/Midline Placement Documentation        Henderson [place] 11/14/2015, 6:20 PM Consent obtained by Jule Economy, RN

## 2015-11-14 NOTE — Consult Note (Signed)
[facility] for Infectious Disease    Date of Admission:  11/10/2015    Total days of antibiotics 15        Day 15 ciprofloxacin        Day 5 vancomycin              Reason for Consult: Smoldering sternal wound infection    Referring Physician: Dr. Lilly Cove  Principal Problem:   Wound infection after surgery Active Problems:   S/P mitral valve replacement with bioprosthetic valve, tricuspid valve repair, maze procedure and CABG x1   . acetaminophen  1,000 mg Oral 4 times per day   Or  . acetaminophen (TYLENOL) oral liquid 160 mg/5 mL  1,000 mg Oral 4 times per day  . aspirin EC  81 mg Oral Daily  . bisacodyl  10 mg Oral Daily  . bisoprolol  10 mg Oral Daily  . ciprofloxacin  500 mg Oral BID  . docusate sodium  100 mg Oral BID  . enoxaparin (LOVENOX) injection  40 mg Subcutaneous Daily  . furosemide  20 mg Oral BID  . gabapentin  100 mg Oral BID  . levothyroxine  88 mcg Oral QAC breakfast  . losartan  100 mg Oral Daily  . pantoprazole  40 mg Oral BID  . potassium chloride SA  20 mEq Oral Daily  . senna-docusate  1 tablet Oral QHS  . simvastatin  20 mg Oral Daily  . sodium chloride flush  3 mL Intravenous Q12H  . spironolactone  25 mg Oral Daily  . vancomycin  1,000 mg Intravenous Q24H    Recommendations: 1. PICC placement 2. Continue IV vancomycin 3. Change ciprofloxacin to IV cefepime 4. Check sedimentation rate and C-reactive protein   Assessment: [name] has a smoldering soft tissue infection over the lower sternum and right breast. A few positive cultures are nondiagnostic. She has had an incomplete response to ciprofloxacin. I think the best option is to put her on a longer course of IV antibiotic therapy targeting staph including MRSA and healthcare associated gram-negative rods. All AFB stains and cultures have been negative. There is no evidence that she has Mycobacterium chimeara acquired from her cardiac surgery. She is in agreement with a  course of IV vancomycin and cefepime and PICC placement.   HPI: [name] is a 81 y.o. female who underwent open heart surgery last August. She required mitral valve replacement with a bioprosthetic valve, tricuspid valve repair, a maze procedure and CABG 1. During the early postoperative period before discharge she developed some sternal wound drainage along the lower portion of her wound. A VAC wound dressing was placed. She was treated with IV vancomycin and ciprofloxacin while hospitalized and discharged on oral ciprofloxacin. Some sternal wires were removed in September. The wound slowly improved and was completely healed by the end of October. However, she continued to have an area of tenderness on her right breast medially. An area was so tender that she found it difficult to wear a bra. She had no fever, chills or sweats. In early January she noticed a tiny blister develop over her incision that drained spontaneously. It seemed to stop and then she had recurrent drainage in early February. She was placed on ciprofloxacin 1 week. She thinks she may have had very slight improvement when she first started Cipro but the drainage never went away. She was seen again on 10/30/2015 and the ciprofloxacin was restarted.  CT scan showed some thickening of the lower sternal scar with inflammation. It also showed persistent irregular soft tissue density in the right breast medially just lateral to the scar. No abscess or osteomyelitis were seen. She was readmitted to the hospital and started on IV vancomycin in addition to the oral ciprofloxacin. She states that she began to feel better after the vancomycin was added. Yesterday she underwent incision and drainage. Some necrotic cartilage was removed along with some inflamed tissue in the breast.  She has had multiple specimens submitted for stains and cultures since this all began. One wound culture from 05/08/2015 grew "multiple organisms". A specimen from  05/24/2015 showed rare gram-negative rods on stain and grew Candida species. A specimen from 10/30/2015 grew normal skin flora. All AFB stains and cultures have been negative. Dr. Roxy Manns told me that the preliminary path review of the breast tissue was benign.   Review of Systems: Review of Systems  Constitutional: Positive for malaise/fatigue. Negative for fever, chills, weight loss and diaphoresis.  HENT: Negative for sore throat.   Respiratory: Negative for cough, sputum production and shortness of breath.   Cardiovascular: Positive for chest pain.  Gastrointestinal: Negative for nausea, vomiting, abdominal pain and diarrhea.  Genitourinary: Negative for dysuria.  Musculoskeletal: Negative for myalgias and joint pain.  Skin: Negative for rash.  Neurological: Negative for headaches.    Past Medical History  Diagnosis Date  . Hypertension   . COPD (chronic obstructive pulmonary disease) ([place])   . Hyperlipidemia   . Meniere disease   . PAF (paroxysmal atrial fibrillation) ([place])   . COPD (chronic obstructive pulmonary disease) ([place])   . Hypothyroidism   . Pulmonary hypertension ([place])   . Chronic diastolic CHF (congestive heart failure) ([place])   . PONV (postoperative nausea and vomiting)   . Anxiety   . GERD (gastroesophageal reflux disease)   . Severe Mitral Regurgitation s/p MV Repair     a. 04/2015 s/p 27 mm [facility] Mitral bovine bioprosthetic tissue valve  . Tricuspid Regurgitation s/p Repair     a. 04/2015 s/p 26 mm Edwards mc3 ring annuloplasty  . 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 04/2015 Complete bilateral atrial lesion set using cryothermy and bipolar radiofrequency ablation with clipping of LA appendage  . CAD s/p CABG x 1     a. 04/2015 LIMA to LAD  . Post-surgical complete heart block, symptomatic     a. 04/2015 s/p MDT [id number] Advisa DR MRI DC PPM.  . Wound infection ([place]) 10/13/2015    Superficial sternal wound infection  . Cancer of right breast ([place])  08/09/2001  . Presence of permanent cardiac pacemaker   . On home oxygen therapy     "2L at night" (11/10/2015)  . Pneumonia ~ 2010  . Arthritis     "hands" (11/10/2015)    Social History  Substance Use Topics  . Smoking status: Former Smoker -- 1.50 packs/day for 40 years    Types: Cigarettes    Quit date: 04/20/1998  . Smokeless tobacco: Never Used  . Alcohol Use: Yes     Comment: 11/10/2015 "glass of wine a few times/year, if that"    Family History  Problem Relation Age of Onset  . Heart disease Father   . Heart disease Brother   . Heart attack Paternal Uncle    Allergies  Allergen Reactions  . Ace Inhibitors Other (See Comments)    Cough  . Augmentin [Amoxicillin-Pot Clavulanate] Swelling  Swollen joints, pain  . Penicillins Swelling    Swollen joints  . Nifedipine Other (See Comments)    Unknown  . Propranolol Other (See Comments)     Unknown  . Diazepam Other (See Comments)    Made her "hyper"  . Meperidine Other (See Comments)    Made her "hyper"  . Propoxyphene Other (See Comments)    Made her "hyper"    OBJECTIVE: Blood pressure 98/66, pulse 62, temperature 97.3 F (36.3 C), temperature source Oral, resp. rate 17, height 5' 1.5" (1.562 m), weight 132 lb 12.8 oz (60.238 kg), SpO2 100 %.  Physical Exam  Constitutional: She is oriented to person, place, and time. No distress.  HENT:  Mouth/Throat: No oropharyngeal exudate.  Eyes: Conjunctivae are normal.  Cardiovascular: Normal rate and regular rhythm.   No murmur heard. Pulmonary/Chest: Breath sounds normal.  Left anterior chest pacemaker site looks good. She has a VAC wound dressing over her lower sternum extending to the right of midline.  Abdominal: Soft. There is no tenderness.  Neurological: She is alert and oriented to person, place, and time.  Skin: No rash noted.  Psychiatric: Mood and affect normal.    Lab Results Lab Results  Component Value Date   WBC 9.6 11/14/2015   HGB 9.5*  11/14/2015   HCT 32.3* 11/14/2015   MCV 87.8 11/14/2015   PLT 182 11/14/2015    Lab Results  Component Value Date   CREATININE 0.81 11/14/2015   BUN 11 11/14/2015   NA 139 11/14/2015   K 4.1 11/14/2015   CL 97* 11/14/2015   CO2 32 11/14/2015    Lab Results  Component Value Date   ALT 25 11/13/2015   AST 24 11/13/2015   ALKPHOS 87 11/13/2015   BILITOT 0.5 11/13/2015     Microbiology: Recent Results (from the past 240 hour(s))  Culture, blood (Routine X 2) w Reflex to ID Panel     Status: None (Preliminary result)   Collection Time: 11/10/15 11:45 AM  Result Value Ref Range Status   Specimen Description BLOOD BLOOD LEFT WRIST  Final   Special Requests BOTTLES DRAWN AEROBIC ONLY 6CC  Final   Culture NO GROWTH 4 DAYS  Final   Report Status PENDING  Incomplete  Culture, blood (Routine X 2) w Reflex to ID Panel     Status: None (Preliminary result)   Collection Time: 11/10/15 11:55 AM  Result Value Ref Range Status   Specimen Description BLOOD LEFT ANTECUBITAL  Final   Special Requests BOTTLES DRAWN AEROBIC AND ANAEROBIC 5CC  Final   Culture NO GROWTH 4 DAYS  Final   Report Status PENDING  Incomplete  Acid Fast Smear (AFB)     Status: None   Collection Time: 11/10/15 12:05 PM  Result Value Ref Range Status   AFB Specimen Processing Concentration  Final   Acid Fast Smear Negative  Final    Comment: (NOTE) Performed At: [facility] [address] [place], [place] [id number] Lindon Romp MD [id number]    Source (AFB) WOUND  Final    Comment: STERNUM  Culture, routine-abscess     Status: None   Collection Time: 11/10/15 12:10 PM  Result Value Ref Range Status   Specimen Description WOUND STERNUM  Final   Special Requests WOUND FROM PREVIOUS STERNOTOMY  Final   Gram Stain   Final    NO WBC SEEN NO SQUAMOUS EPITHELIAL CELLS SEEN NO ORGANISMS SEEN Performed at [facility]  Final    NO GROWTH 2 DAYS Performed at [facility]    Report Status 11/12/2015 FINAL  Final  Surgical PCR screen     Status: None   Collection Time: 11/10/15  4:32 PM  Result Value Ref Range Status   MRSA, PCR NEGATIVE NEGATIVE Final   Staphylococcus aureus NEGATIVE NEGATIVE Final    Comment:        The Xpert SA Assay (FDA approved for NASAL specimens in patients over 31 years of age), is one component of a comprehensive surveillance program.  Test performance has been validated by [facility] for patients greater than or equal to 79 year old. It is not intended to diagnose infection nor to guide or monitor treatment.   Anaerobic culture     Status: None (Preliminary result)   Collection Time: 11/13/15  3:00 PM  Result Value Ref Range Status   Specimen Description TISSUE  Final   Special Requests   Final    COSTAL CARTILAGE AND CHEST PT ON VANC ZINACEF AND CIPRO   Gram Stain   Final    NO WBC SEEN NO ORGANISMS SEEN Performed at [facility]    Culture PENDING  Incomplete   Report Status PENDING  Incomplete  Tissue culture     Status: None (Preliminary result)   Collection Time: 11/13/15  3:00 PM  Result Value Ref Range Status   Specimen Description TISSUE  Final   Special Requests   Final    COSTAL CARTILAGE AND CHEST PT ON VANC ZINACEF AND CIPRO   Gram Stain   Final    NO WBC SEEN NO ORGANISMS SEEN Performed at [facility]    Culture PENDING  Incomplete   Report Status PENDING  Incomplete    Michel Bickers, MD [place] [facility] [phone] pager   [phone] cell 11/14/2015, 2:30 PM

## 2015-11-14 NOTE — Progress Notes (Signed)
PCA pump discontinued.   Pt history cleared.  Pt used 40 mcg over last 4 hours.  Will discontinue and continue to monitor Pt pain level.

## 2015-11-14 NOTE — Progress Notes (Addendum)
[name]       ,[name]             [phone]      1 Day Post-Op Procedure(s) (LRB): Excisional drainage of RIGHT Chest wall mass and breast mass  (N/A) APPLICATION OF WOUND VAC (N/A) Subjective: Some soreness  Objective: Vital signs in last 24 hours: Temp:  [97.4 F (36.3 C)-98.2 F (36.8 C)] 98.2 F (36.8 C) (03/07 0405) Pulse Rate:  [59-69] 61 (03/07 0405) Cardiac Rhythm:  [-] Atrial paced;A-V Sequential paced;Bundle branch block (03/06 1900) Resp:  [11-18] 16 (03/07 0405) BP: (106-126)/(45-61) 106/45 mmHg (03/07 0405) SpO2:  [97 %-100 %] 100 % (03/07 0405) Weight:  [132 lb 12.8 oz (60.238 kg)] 132 lb 12.8 oz (60.238 kg) (03/07 0405)  Hemodynamic parameters for last 24 hours:    Intake/Output from previous day: 03/06 0701 - 03/07 0700 In: 620 [P.O.:120; I.V.:500] Out: 600 [Urine:600] Intake/Output this shift:    General appearance: alert, cooperative and no distress Heart: regular rate and rhythm Lungs: clear to auscultation bilaterally Abdomen: benign Extremities: PAS in place Wound: Vac in place  Lab Results:  Recent Labs  11/13/15 0709 11/14/15 0317  WBC 9.3 9.6  HGB 10.1* 9.5*  HCT 34.2* 32.3*  PLT 188 182   BMET:  Recent Labs  11/13/15 0709 11/14/15 0317  NA 140 139  K 4.4 4.1  CL 101 97*  CO2 26 32  GLUCOSE 114* 142*  BUN 16 11  CREATININE 0.79 0.81  CALCIUM 9.3 9.0    PT/INR:  Recent Labs  11/13/15 0302  LABPROT 16.2*  INR 1.29   ABG    Component Value Date/Time   PHART 7.420 11/14/2015 0345   HCO3 31.5* 11/14/2015 0345   TCO2 33.0 11/14/2015 0345   ACIDBASEDEF 3.0* 04/26/2015 0832   O2SAT 98.4 11/14/2015 0345   CBG (last 3)  No results for input(s): GLUCAP in the last 72 hours.  Meds Scheduled Meds: . acetaminophen  1,000 mg Oral 4 times per day   Or  . acetaminophen (TYLENOL) oral liquid 160 mg/5 mL  1,000 mg Oral 4 times per day  . aspirin EC  81 mg Oral Daily  . bisacodyl   10 mg Oral Daily  . bisoprolol  10 mg Oral Daily  . ciprofloxacin  500 mg Oral BID  . docusate sodium  100 mg Oral BID  . enoxaparin (LOVENOX) injection  40 mg Subcutaneous Daily  . fentaNYL   Intravenous 6 times per day  . furosemide  20 mg Oral BID  . gabapentin  100 mg Oral BID  . levothyroxine  88 mcg Oral QAC breakfast  . losartan  100 mg Oral Daily  . pantoprazole  40 mg Oral BID  . potassium chloride SA  20 mEq Oral Daily  . senna-docusate  1 tablet Oral QHS  . simvastatin  20 mg Oral Daily  . sodium chloride flush  3 mL Intravenous Q12H  . sodium chloride flush  3 mL Intravenous Q12H  . spironolactone  25 mg Oral Daily  . vancomycin  1,000 mg Intravenous Q24H   Continuous Infusions: . dextrose 5 % and 0.45 % NaCl with KCl 20 mEq/L    . lactated ringers 1 mL (11/13/15 1820)   PRN Meds:.sodium chloride, acetaminophen **OR** acetaminophen, benzonatate, diphenhydrAMINE **OR** diphenhydrAMINE, fluticasone, ipratropium-albuterol, magnesium hydroxide, morphine injection, naloxone **AND** sodium chloride flush, ondansetron **OR** ondansetron (ZOFRAN) IV, ondansetron (ZOFRAN) IV, ondansetron (ZOFRAN) IV, oxyCODONE,  oxyCODONE, potassium chloride, sodium chloride flush, traMADol  Xrays Dg Chest Port 1 View  11/13/2015  CLINICAL DATA:  Incisional drainage of right chest wall mass. EXAM: PORTABLE CHEST 1 VIEW COMPARISON:  CT chest 11/10/2015 and chest radiograph 05/22/2015. FINDINGS: Trachea is midline. Heart is enlarged. Pacemaker lead tips are stable in position, in the right atrium and right ventricle. Left atrial appendage clip is in place with evidence of mitral and tricuspid valve repair. Linear scarring or atelectasis in the midlung zones bilaterally. Calcified granuloma in the lateral right midlung zone. Lungs are otherwise clear. No pleural fluid. No pneumothorax. IMPRESSION: No acute findings. Electronically Signed   By: Lorin Picket M.D.   On: 11/13/2015 16:57     Assessment/Plan: S/P Procedure(s) (LRB): Excisional drainage of RIGHT Chest wall mass and breast mass  (N/A) APPLICATION OF WOUND VAC (N/A)  1 stable with Vac in place on Vanc/Cipro 2 labs stable 3 hemodyn stable, paced rhythm  LOS: 4 days    [name],[name] 11/14/2015  I have seen and examined the patient and agree with the assessment and plan as outlined.  Will ask Infectious Disease team to see patient given unclear source of infection.  Follow up on path results from OR.  Consult Plastic Surgery to assist w/ wound management, consider delayed flap closure.  Rexene Alberts, MD 11/14/2015 10:07 AM

## 2015-11-15 ENCOUNTER — Encounter (HOSPITAL_COMMUNITY): Payer: Self-pay | Admitting: Plastic Surgery

## 2015-11-15 DIAGNOSIS — Z95828 Presence of other vascular implants and grafts: Secondary | ICD-10-CM

## 2015-11-15 LAB — COMPREHENSIVE METABOLIC PANEL
ALK PHOS: 69 U/L (ref 38–126)
ALT: 18 U/L (ref 14–54)
AST: 18 U/L (ref 15–41)
Albumin: 2.7 g/dL — ABNORMAL LOW (ref 3.5–5.0)
Anion gap: 10 (ref 5–15)
BUN: 14 mg/dL (ref 6–20)
CALCIUM: 9 mg/dL (ref 8.9–10.3)
CHLORIDE: 97 mmol/L — AB (ref 101–111)
CO2: 31 mmol/L (ref 22–32)
CREATININE: 1.1 mg/dL — AB (ref 0.44–1.00)
GFR calc non Af Amer: 47 mL/min — ABNORMAL LOW (ref >60)
GFR, EST AFRICAN AMERICAN: 54 mL/min — AB (ref >60)
Glucose, Bld: 123 mg/dL — ABNORMAL HIGH (ref 65–99)
Potassium: 4.5 mmol/L (ref 3.5–5.1)
SODIUM: 138 mmol/L (ref 135–145)
Total Bilirubin: 0.8 mg/dL (ref 0.3–1.2)
Total Protein: 6.1 g/dL — ABNORMAL LOW (ref 6.5–8.1)

## 2015-11-15 LAB — CULTURE, BLOOD (ROUTINE X 2)
CULTURE: NO GROWTH
CULTURE: NO GROWTH

## 2015-11-15 LAB — CBC
HCT: 30.6 % — ABNORMAL LOW (ref 36.0–46.0)
HEMOGLOBIN: 8.9 g/dL — AB (ref 12.0–15.0)
MCH: 25.4 pg — AB (ref 26.0–34.0)
MCHC: 29.1 g/dL — ABNORMAL LOW (ref 30.0–36.0)
MCV: 87.4 fL (ref 78.0–100.0)
PLATELETS: 178 10*3/uL (ref 150–400)
RBC: 3.5 MIL/uL — AB (ref 3.87–5.11)
RDW: 20.2 % — ABNORMAL HIGH (ref 11.5–15.5)
WBC: 7.6 10*3/uL (ref 4.0–10.5)

## 2015-11-15 LAB — C-REACTIVE PROTEIN: CRP: 3.2 mg/dL — ABNORMAL HIGH (ref <1.0)

## 2015-11-15 LAB — SEDIMENTATION RATE: Sed Rate: 79 mm/hr — ABNORMAL HIGH (ref 0–22)

## 2015-11-15 MED ORDER — LACTULOSE 10 GM/15ML PO SOLN
20.0000 g | Freq: Every day | ORAL | Status: DC | PRN
  Administered 2015-11-15 – 2015-11-23 (×4): 20 g via ORAL
  Filled 2015-11-15 (×4): qty 30

## 2015-11-15 NOTE — Consult Note (Signed)
Reason for Consult:chest wound Referring Physician: Dr. Darylene Price  [name] is an 81 y.o. female.  HPI: The patient is a 81 yrs old wf here for treatment of a chest wound.  She underwent open heart surgery with valve repair, CABG and pace maker placement ~ 8 months ago.  She developed a postoperative wound.  That healed and then she had additional breakdown.  The cultures have been negative but she was treated with antibiotics.  She was taken to the OR this week and there was necrosis of the right sided cartilage.  This is likely due to the breast cancer treatment that included a lumpectomy and radiation over 10 years ago.  The area is ~ 6 x 10 cm at the lower sternal area and right medial inframammary fold area.  The area seems to be granulating well.  No purulence noted.  The surrounding tissue is firm and has the rubbery texture.      Past Medical History  Diagnosis Date  . Hypertension   . COPD (chronic obstructive pulmonary disease) ([place])   . Hyperlipidemia   . Meniere disease   . PAF (paroxysmal atrial fibrillation) ([place])   . COPD (chronic obstructive pulmonary disease) ([place])   . Hypothyroidism   . Pulmonary hypertension ([place])   . Chronic diastolic CHF (congestive heart failure) ([place])   . PONV (postoperative nausea and vomiting)   . Anxiety   . GERD (gastroesophageal reflux disease)   . Severe Mitral Regurgitation s/p MV Repair     a. 04/2015 s/p 27 mm [name] Mitral bovine bioprosthetic tissue valve  . Tricuspid Regurgitation s/p Repair     a. 04/2015 s/p 26 mm Edwards mc3 ring annuloplasty  . 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 04/2015 Complete bilateral atrial lesion set using cryothermy and bipolar radiofrequency ablation with clipping of LA appendage  . CAD s/p CABG x 1     a. 04/2015 LIMA to LAD  . Post-surgical complete heart block, symptomatic     a. 04/2015 s/p MDT [id number] Advisa DR MRI DC PPM.  . Wound infection ([place]) 10/13/2015    Superficial sternal  wound infection  . Cancer of right breast ([place]) 08/09/2001  . Presence of permanent cardiac pacemaker   . On home oxygen therapy     "2L at night" (11/10/2015)  . Pneumonia ~ 2010  . Arthritis     "hands" (11/10/2015)    Past Surgical History  Procedure Laterality Date  . Cochlear implant Left 2005?  Marland Kitchen Cardiac catheterization  11/2013    [facility]  . Cardiac catheterization  10/2014    [facility]  . Mitral valve repair N/A 04/25/2015    Procedure: MITRAL VALVE  REPLACEMENT using a 27 mm Edwards Perimount Magna Mitral Ease Valve;  Surgeon: Rexene Alberts, MD;  Location: [place];  Service: Open Heart Surgery;  Laterality: N/A;  . Tricuspid valve replacement N/A 04/25/2015    Procedure: TRICUSPID VALVE REPAIR;  Surgeon: Rexene Alberts, MD;  Location: [place];  Service: Open Heart Surgery;  Laterality: N/A;  . Maze N/A 04/25/2015    Procedure: MAZE;  Surgeon: Rexene Alberts, MD;  Location: [place];  Service: Open Heart Surgery;  Laterality: N/A;  . Tee without cardioversion N/A 04/25/2015    Procedure: TRANSESOPHAGEAL ECHOCARDIOGRAM (TEE);  Surgeon: Rexene Alberts, MD;  Location: [place];  Service: Open Heart Surgery;  Laterality: N/A;  . Clipping of atrial appendage N/A 04/25/2015  Procedure: CLIPPING OF ATRIAL APPENDAGE;  Surgeon: Rexene Alberts, MD;  Location: [place];  Service: Open Heart Surgery;  Laterality: N/A;  . Ep implantable device N/A 05/02/2015    Procedure: Pacemaker Implant;  Surgeon: Thompson Grayer, MD;  Location: [place] [facility];  Service: Cardiovascular;  Laterality: N/A;  . Sternal wound debridement N/A 05/09/2015    Procedure: STERNAL WOUND DEBRIDEMENT;  Surgeon: Rexene Alberts, MD;  Location: [place];  Service: Thoracic;  Laterality: N/A;  . Application of wound vac N/A 05/09/2015    Procedure: APPLICATION OF WOUND VAC;  Surgeon: Rexene Alberts, MD;  Location: [place];  Service: Thoracic;  Laterality: N/A;  . Sternal wires removal N/A 06/05/2015    Procedure: STERNAL WIRES REMOVAL;  Surgeon:  Rexene Alberts, MD;  Location: [place];  Service: Thoracic;  Laterality: N/A;  . Tonsillectomy    . Breast biopsy Left 11/25/06    neg  . Breast biopsy Left 01/20/12    /clip-neg  . Mastectomy, partial Right 2002  . Vaginal hysterectomy    . Dilation and curettage of uterus  "several before hysterectomy"  . Cataract extraction w/ intraocular lens  implant, bilateral Bilateral 2014  . Cardiac valve replacement    . Insert / replace / remove pacemaker    . Coronary artery bypass graft N/A 04/25/2015    Procedure: CORONARY ARTERY BYPASS GRAFTING (CABG) x ONE, using left internal mammary artery;  Surgeon: Rexene Alberts, MD;  Location: [place];  Service: Open Heart Surgery;  Laterality: N/A;  . Sternal wound debridement N/A 11/13/2015    Procedure: Excisional drainage of RIGHT Chest wall mass and breast mass ;  Surgeon: Rexene Alberts, MD;  Location: [place];  Service: Thoracic;  Laterality: N/A;  . Application of wound vac N/A 11/13/2015    Procedure: APPLICATION OF WOUND VAC;  Surgeon: Rexene Alberts, MD;  [facility];  Service: Thoracic;  Laterality: N/A;    Family History  Problem Relation Age of Onset  . Heart disease Father   . Heart disease Brother   . Heart attack Paternal Uncle     Social History:  reports that she quit smoking about 17 years ago. Her smoking use included Cigarettes. She has a 60 pack-year smoking history. She has never used smokeless tobacco. She reports that she drinks alcohol. She reports that she does not use illicit drugs.  Allergies:  Allergies  Allergen Reactions  . Ace Inhibitors Other (See Comments)    Cough  . Augmentin [Amoxicillin-Pot Clavulanate] Swelling    Swollen joints, pain  . Penicillins Swelling    Swollen joints  . Nifedipine Other (See Comments)    Unknown  . Propranolol Other (See Comments)     Unknown  . Diazepam Other (See Comments)    Made her "hyper"  . Meperidine Other (See Comments)    Made her "hyper"  . Propoxyphene Other  (See Comments)    Made her "hyper"    Medications: I have reviewed the patient's current medications.  Results for orders placed or performed during the hospital encounter of 11/10/15 (from the past 48 hour(s))  Anaerobic culture     Status: None (Preliminary result)   Collection Time: 11/13/15  3:00 PM  Result Value Ref Range   Specimen Description TISSUE    Special Requests      COSTAL CARTILAGE AND CHEST PT ON VANC ZINACEF AND CIPRO   Gram Stain      NO WBC SEEN  NO ORGANISMS SEEN Performed at [facility]    Culture PENDING    Report Status PENDING   Tissue culture     Status: None (Preliminary result)   Collection Time: 11/13/15  3:00 PM  Result Value Ref Range   Specimen Description TISSUE    Special Requests      COSTAL CARTILAGE AND CHEST PT ON VANC ZINACEF AND CIPRO   Gram Stain      NO WBC SEEN NO ORGANISMS SEEN Performed at [facility]    Culture PENDING    Report Status PENDING   Acid Fast Smear (AFB)     Status: None   Collection Time: 11/13/15  3:00 PM  Result Value Ref Range   AFB Specimen Processing Comment     Comment: Tissue Grinding and Digestion/Decontamination   Acid Fast Smear Negative     Comment: (NOTE) Performed At: [facility] [place], [place] [id number] Lindon Romp MD [id number]    Source (AFB) TISSUE     Comment: COSTAL CARTILAGE AND CHEST  CBC     Status: Abnormal   Collection Time: 11/14/15  3:17 AM  Result Value Ref Range   WBC 9.6 4.0 - 10.5 K/uL   RBC 3.68 (L) 3.87 - 5.11 MIL/uL   Hemoglobin 9.5 (L) 12.0 - 15.0 g/dL   HCT 32.3 (L) 36.0 - 46.0 %   MCV 87.8 78.0 - 100.0 fL   MCH 25.8 (L) 26.0 - 34.0 pg   MCHC 29.4 (L) 30.0 - 36.0 g/dL   RDW 20.1 (H) 11.5 - 15.5 %   Platelets 182 150 - 400 K/uL  Basic metabolic panel     Status: Abnormal   Collection Time: 11/14/15  3:17 AM  Result Value Ref Range   Sodium 139 135 - 145 mmol/L   Potassium 4.1 3.5 - 5.1 mmol/L   Chloride 97 (L)  101 - 111 mmol/L   CO2 32 22 - 32 mmol/L   Glucose, Bld 142 (H) 65 - 99 mg/dL   BUN 11 6 - 20 mg/dL   Creatinine, Ser 0.81 0.44 - 1.00 mg/dL   Calcium 9.0 8.9 - 10.3 mg/dL   GFR calc non Af Amer >60 >60 mL/min   GFR calc Af Amer >60 >60 mL/min    Comment: (NOTE) The eGFR has been calculated using the CKD EPI equation. This calculation has not been validated in all clinical situations. eGFR's persistently <60 mL/min signify possible Chronic Kidney Disease.    Anion gap 10 5 - 15  Blood gas, arterial     Status: Abnormal   Collection Time: 11/14/15  3:45 AM  Result Value Ref Range   O2 Content 2.0 L/min   Delivery systems NASAL CANNULA    pH, Arterial 7.420 7.350 - 7.450   pCO2 arterial 49.4 (H) 35.0 - 45.0 mmHg   pO2, Arterial 109 (H) 80.0 - 100.0 mmHg   Bicarbonate 31.5 (H) 20.0 - 24.0 mEq/L   TCO2 33.0 0 - 100 mmol/L   Acid-Base Excess 7.0 (H) 0.0 - 2.0 mmol/L   O2 Saturation 98.4 %   Patient temperature 98.6    Collection site RIGHT RADIAL    Drawn by [id number]    Sample type ARTERIAL DRAW    Allens test (pass/fail) PASS PASS  Prealbumin     Status: None   Collection Time: 11/14/15  9:25 PM  Result Value Ref Range   Prealbumin 19.0 18 - 38 mg/dL  CBC  Status: Abnormal   Collection Time: 11/15/15  4:45 AM  Result Value Ref Range   WBC 7.6 4.0 - 10.5 K/uL   RBC 3.50 (L) 3.87 - 5.11 MIL/uL   Hemoglobin 8.9 (L) 12.0 - 15.0 g/dL   HCT 30.6 (L) 36.0 - 46.0 %   MCV 87.4 78.0 - 100.0 fL   MCH 25.4 (L) 26.0 - 34.0 pg   MCHC 29.1 (L) 30.0 - 36.0 g/dL   RDW 20.2 (H) 11.5 - 15.5 %   Platelets 178 150 - 400 K/uL  Comprehensive metabolic panel     Status: Abnormal   Collection Time: 11/15/15  4:45 AM  Result Value Ref Range   Sodium 138 135 - 145 mmol/L   Potassium 4.5 3.5 - 5.1 mmol/L   Chloride 97 (L) 101 - 111 mmol/L   CO2 31 22 - 32 mmol/L   Glucose, Bld 123 (H) 65 - 99 mg/dL   BUN 14 6 - 20 mg/dL   Creatinine, Ser 1.10 (H) 0.44 - 1.00 mg/dL   Calcium 9.0 8.9 - 10.3  mg/dL   Total Protein 6.1 (L) 6.5 - 8.1 g/dL   Albumin 2.7 (L) 3.5 - 5.0 g/dL   AST 18 15 - 41 U/L   ALT 18 14 - 54 U/L   Alkaline Phosphatase 69 38 - 126 U/L   Total Bilirubin 0.8 0.3 - 1.2 mg/dL   GFR calc non Af Amer 47 (L) >60 mL/min   GFR calc Af Amer 54 (L) >60 mL/min    Comment: (NOTE) The eGFR has been calculated using the CKD EPI equation. This calculation has not been validated in all clinical situations. eGFR's persistently <60 mL/min signify possible Chronic Kidney Disease.    Anion gap 10 5 - 15    Dg Chest Port 1 View  11/14/2015  CLINICAL DATA:  Evaluate placement of right PICC EXAM: PORTABLE CHEST 1 VIEW COMPARISON:  11/13/2015 FINDINGS: Tip of the new right-sided PICC projects in the lower superior vena cava. Cardiac silhouette is enlarged. Changes from cardiac surgery and valve replacements is stable. No mediastinal or hilar masses or adenopathy. Lungs are essentially clear.  No pleural effusion.  No pneumothorax. IMPRESSION: 1. Right PICC is well positioned with its tip in the lower superior vena cava. 2. No other change from prior study. No acute cardiopulmonary disease. Electronically Signed   By: Lajean Manes M.D.   On: 11/14/2015 19:08   Dg Chest Port 1 View  11/13/2015  CLINICAL DATA:  Incisional drainage of right chest wall mass. EXAM: PORTABLE CHEST 1 VIEW COMPARISON:  CT chest 11/10/2015 and chest radiograph 05/22/2015. FINDINGS: Trachea is midline. Heart is enlarged. Pacemaker lead tips are stable in position, in the right atrium and right ventricle. Left atrial appendage clip is in place with evidence of mitral and tricuspid valve repair. Linear scarring or atelectasis in the midlung zones bilaterally. Calcified granuloma in the lateral right midlung zone. Lungs are otherwise clear. No pleural fluid. No pneumothorax. IMPRESSION: No acute findings. Electronically Signed   By: Lorin Picket M.D.   On: 11/13/2015 16:57    Review of Systems  Constitutional:  Negative.   HENT: Negative.   Eyes: Negative.   Respiratory: Negative.  Negative for shortness of breath.   Cardiovascular: Positive for leg swelling.  Gastrointestinal: Negative for heartburn and abdominal pain.  Genitourinary: Negative.   Musculoskeletal: Negative.   Skin: Negative.   Neurological: Negative for sensory change, speech change and focal weakness.  Psychiatric/Behavioral: Negative.  The patient does not have insomnia.    Blood pressure 114/40, pulse 74, temperature 98.2 F (36.8 C), temperature source Oral, resp. rate 18, height 5' 1.5" (1.562 m), weight 60.9 kg (134 lb 4.2 oz), SpO2 93 %. Physical Exam  Constitutional: She is oriented to person, place, and time. She appears well-developed and well-nourished.  HENT:  Head: Normocephalic and atraumatic.  Eyes: Conjunctivae and EOM are normal. Pupils are equal, round, and reactive to light.  Cardiovascular: Normal rate.   Respiratory: Effort normal. No respiratory distress. She has no wheezes.  GI: Soft. She exhibits no distension. There is no tenderness.  Musculoskeletal: She exhibits no edema.  Neurological: She is alert and oriented to person, place, and time.  Skin: Skin is warm.  Psychiatric: She has a normal mood and affect. Her behavior is normal. Judgment and thought content normal.    Assessment/Plan: We discussed treatment options which included local care with VAC and ACell.  This would have been a good option if the area had not been radiated.  At this point I think we need good healthy unradiated muscle.  This can be done with a VRAM or latissimus muscle flap.  We will work to arrange this for next week.  Wallace Going 11/15/2015, 9:50 AM

## 2015-11-15 NOTE — Progress Notes (Signed)
Patient ID: [name], female   DOB: 05/15/1935, 81 y.o.   MRN: [id number]         [facility] for Infectious Disease    Date of Admission:  11/10/2015    Total days of antibiotics 16        Day 2 vancomycin        Day 2 cefepime         Principal Problem:   Wound infection after surgery Active Problems:   S/P mitral valve replacement with bioprosthetic valve, tricuspid valve repair, maze procedure and CABG x1   . acetaminophen  1,000 mg Oral 4 times per day   Or  . acetaminophen (TYLENOL) oral liquid 160 mg/5 mL  1,000 mg Oral 4 times per day  . aspirin EC  81 mg Oral Daily  . bisacodyl  10 mg Oral Daily  . bisoprolol  10 mg Oral Daily  . ceFEPime (MAXIPIME) IV  2 g Intravenous Q12H  . docusate sodium  100 mg Oral BID  . enoxaparin (LOVENOX) injection  40 mg Subcutaneous Daily  . furosemide  20 mg Oral BID  . gabapentin  100 mg Oral BID  . levothyroxine  88 mcg Oral QAC breakfast  . losartan  100 mg Oral Daily  . pantoprazole  40 mg Oral BID  . potassium chloride SA  20 mEq Oral Daily  . senna-docusate  1 tablet Oral QHS  . simvastatin  20 mg Oral Daily  . sodium chloride flush  3 mL Intravenous Q12H  . spironolactone  25 mg Oral Daily  . vancomycin  1,000 mg Intravenous Q24H    SUBJECTIVE: Overall, she is feeling a little bit better.  Review of Systems: Review of Systems  Constitutional: Negative for fever, chills and diaphoresis.  Respiratory: Negative for cough, sputum production and shortness of breath.   Cardiovascular: Positive for chest pain.  Gastrointestinal: Negative for nausea, vomiting and diarrhea.    Past Medical History  Diagnosis Date  . Hypertension   . COPD (chronic obstructive pulmonary disease) ([place])   . Hyperlipidemia   . Meniere disease   . PAF (paroxysmal atrial fibrillation) ([place])   . COPD (chronic obstructive pulmonary disease) ([place])   . Hypothyroidism   . Pulmonary hypertension ([place])   . Chronic diastolic CHF (congestive  heart failure) ([place])   . PONV (postoperative nausea and vomiting)   . Anxiety   . GERD (gastroesophageal reflux disease)   . Severe Mitral Regurgitation s/p MV Repair     a. 04/2015 s/p 27 mm [facility] Mitral bovine bioprosthetic tissue valve  . Tricuspid Regurgitation s/p Repair     a. 04/2015 s/p 26 mm Edwards mc3 ring annuloplasty  . 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 04/2015 Complete bilateral atrial lesion set using cryothermy and bipolar radiofrequency ablation with clipping of LA appendage  . CAD s/p CABG x 1     a. 04/2015 LIMA to LAD  . Post-surgical complete heart block, symptomatic     a. 04/2015 s/p MDT [id number] Advisa DR MRI DC PPM.  . Wound infection ([place]) 10/13/2015    Superficial sternal wound infection  . Cancer of right breast ([place]) 08/09/2001  . Presence of permanent cardiac pacemaker   . On home oxygen therapy     "2L at night" (11/10/2015)  . Pneumonia ~ 2010  . Arthritis     "hands" (11/10/2015)    Social History  Substance Use Topics  . Smoking  status: Former Smoker -- 1.50 packs/day for 40 years    Types: Cigarettes    Quit date: 04/20/1998  . Smokeless tobacco: Never Used  . Alcohol Use: Yes     Comment: 11/10/2015 "glass of wine a few times/year, if that"    Family History  Problem Relation Age of Onset  . Heart disease Father   . Heart disease Brother   . Heart attack Paternal Uncle    Allergies  Allergen Reactions  . Ace Inhibitors Other (See Comments)    Cough  . Augmentin [Amoxicillin-Pot Clavulanate] Swelling    Swollen joints, pain  . Penicillins Swelling    Swollen joints  . Nifedipine Other (See Comments)    Unknown  . Propranolol Other (See Comments)     Unknown  . Diazepam Other (See Comments)    Made her "hyper"  . Meperidine Other (See Comments)    Made her "hyper"  . Propoxyphene Other (See Comments)    Made her "hyper"    OBJECTIVE: Filed Vitals:   11/14/15 1248 11/14/15 2206 11/15/15 0413 11/15/15 1346  BP: 98/66  101/36 114/40 114/53  Pulse: 62 59 74 67  Temp: 97.3 F (36.3 C) 97.9 F (36.6 C) 98.2 F (36.8 C) 97.8 F (36.6 C)  TempSrc: Oral Oral Oral Oral  Resp: 17 18 18 18   Height:      Weight:   134 lb 4.2 oz (60.9 kg)   SpO2: 100% 94% 93% 96%   Body mass index is 24.96 kg/(m^2).  Physical Exam  Constitutional:  She is alert and in good spirits.  Cardiovascular: Normal rate and regular rhythm.   No murmur heard. Pulmonary/Chest: Breath sounds normal.  Pacemaker site looks good. Vac dressing in place over her lower sternum.  Skin: No rash noted.  New right arm PICC.  Psychiatric: Mood and affect normal.    Lab Results Lab Results  Component Value Date   WBC 7.6 11/15/2015   HGB 8.9* 11/15/2015   HCT 30.6* 11/15/2015   MCV 87.4 11/15/2015   PLT 178 11/15/2015    Lab Results  Component Value Date   CREATININE 1.10* 11/15/2015   BUN 14 11/15/2015   NA 138 11/15/2015   K 4.5 11/15/2015   CL 97* 11/15/2015   CO2 31 11/15/2015    Lab Results  Component Value Date   ALT 18 11/15/2015   AST 18 11/15/2015   ALKPHOS 69 11/15/2015   BILITOT 0.8 11/15/2015     Microbiology: Recent Results (from the past 240 hour(s))  Culture, blood (Routine X 2) w Reflex to ID Panel     Status: None (Preliminary result)   Collection Time: 11/10/15 11:45 AM  Result Value Ref Range Status   Specimen Description BLOOD BLOOD LEFT WRIST  Final   Special Requests BOTTLES DRAWN AEROBIC ONLY 6CC  Final   Culture NO GROWTH 4 DAYS  Final   Report Status PENDING  Incomplete  Culture, blood (Routine X 2) w Reflex to ID Panel     Status: None (Preliminary result)   Collection Time: 11/10/15 11:55 AM  Result Value Ref Range Status   Specimen Description BLOOD LEFT ANTECUBITAL  Final   Special Requests BOTTLES DRAWN AEROBIC AND ANAEROBIC 5CC  Final   Culture NO GROWTH 4 DAYS  Final   Report Status PENDING  Incomplete  Acid Fast Smear (AFB)     Status: None   Collection Time: 11/10/15 12:05 PM    Result Value Ref Range Status  AFB Specimen Processing Concentration  Final   Acid Fast Smear Negative  Final    Comment: (NOTE) Performed At: [facility] [place], [place] [id number] Lindon Romp MD [id number]    Source (AFB) WOUND  Final    Comment: STERNUM  Fungus Culture With Stain     Status: None (Preliminary result)   Collection Time: 11/10/15 12:05 PM  Result Value Ref Range Status   Fungus Stain Final report  Final    Comment: (NOTE) Performed At: [facility] [place], [place] [id number] Lindon Romp MD [id number]    Fungus (Mycology) Culture PENDING  Incomplete   Fungal Source WOUND  Final    Comment: STERNUM  Fungus Culture Result     Status: None   Collection Time: 11/10/15 12:05 PM  Result Value Ref Range Status   Result 1 Comment  Final    Comment: (NOTE) KOH/Calcofluor preparation:  no fungus observed. Performed At: [facility] [place], [place] [id number] Lindon Romp MD [id number]   Culture, routine-abscess     Status: None   Collection Time: 11/10/15 12:10 PM  Result Value Ref Range Status   Specimen Description WOUND STERNUM  Final   Special Requests WOUND FROM PREVIOUS STERNOTOMY  Final   Gram Stain   Final    NO WBC SEEN NO SQUAMOUS EPITHELIAL CELLS SEEN NO ORGANISMS SEEN Performed at [facility]    Culture   Final    NO GROWTH 2 DAYS Performed at [facility]    Report Status 11/12/2015 FINAL  Final  Surgical PCR screen     Status: None   Collection Time: 11/10/15  4:32 PM  Result Value Ref Range Status   MRSA, PCR NEGATIVE NEGATIVE Final   Staphylococcus aureus NEGATIVE NEGATIVE Final    Comment:        The Xpert SA Assay (FDA approved for NASAL specimens in patients over 40 years of age), is one component of a comprehensive surveillance program.  Test performance has been validated by [facility] for patients greater than  or equal to 20 year old. It is not intended to diagnose infection nor to guide or monitor treatment.   Anaerobic culture     Status: None (Preliminary result)   Collection Time: 11/13/15  3:00 PM  Result Value Ref Range Status   Specimen Description TISSUE  Final   Special Requests   Final    COSTAL CARTILAGE AND CHEST PT ON VANC ZINACEF AND CIPRO   Gram Stain   Final    NO WBC SEEN NO ORGANISMS SEEN Performed at [facility]    Culture   Final    NO ANAEROBES ISOLATED; CULTURE IN PROGRESS FOR 5 DAYS Performed at [facility]    Report Status PENDING  Incomplete  Tissue culture     Status: None (Preliminary result)   Collection Time: 11/13/15  3:00 PM  Result Value Ref Range Status   Specimen Description TISSUE  Final   Special Requests   Final    COSTAL CARTILAGE AND CHEST PT ON VANC ZINACEF AND CIPRO   Gram Stain   Final    NO WBC SEEN NO ORGANISMS SEEN Performed at [facility]    Culture   Final    NO GROWTH 1 DAY Performed at [facility]    Report Status PENDING  Incomplete  Acid Fast Smear (AFB)     Status: None  Collection Time: 11/13/15  3:00 PM  Result Value Ref Range Status   AFB Specimen Processing Comment  Final    Comment: Tissue Grinding and Digestion/Decontamination   Acid Fast Smear Negative  Final    Comment: (NOTE) Performed At: [facility] [place], [place] [id number] Lindon Romp MD [id number]    Source (AFB) TISSUE  Final    Comment: COSTAL CARTILAGE AND CHEST     ASSESSMENT: All recent wound cultures and stains have been negative but, of course, could be falsely negative due to recent therapy with ciprofloxacin. I'm still concerned about possible smoldering infection and will continue current antibiotic therapy pending flap surgery next week.  PLAN: 1. Continue vancomycin and cefepime  Michel Bickers, MD [facility] for Infectious [place]  [facility] [phone] pager   [phone] cell 11/15/2015, 2:37 PM

## 2015-11-15 NOTE — Progress Notes (Addendum)
[address]       [place] [address]             [phone]      2 Days Post-Op Procedure(s) (LRB): Excisional drainage of RIGHT Chest wall mass and breast mass  (N/A) APPLICATION OF WOUND VAC (N/A) Subjective:  Feels less sore, some constipation  Objective: Vital signs in last 24 hours: Temp:  [97.3 F (36.3 C)-98.2 F (36.8 C)] 98.2 F (36.8 C) (03/08 0413) Pulse Rate:  [59-74] 74 (03/08 0413) Cardiac Rhythm:  [-] Atrial paced;Heart block (03/07 1900) Resp:  [17-18] 18 (03/08 0413) BP: (98-114)/(36-66) 114/40 mmHg (03/08 0413) SpO2:  [93 %-100 %] 93 % (03/08 0413) Weight:  [134 lb 4.2 oz (60.9 kg)] 134 lb 4.2 oz (60.9 kg) (03/08 0413)  Hemodynamic parameters for last 24 hours:    Intake/Output from previous day: 03/07 0701 - 03/08 0700 In: 480 [P.O.:480] Out: 420 [Urine:300; Drains:120] Intake/Output this shift:    General appearance: alert, cooperative and no distress Heart: regular rate and rhythm Lungs: clear to auscultation bilaterally Abdomen: benign Extremities: SCD's in place Wound: incis just evaluated by wound care nurse, Vac in place  Lab Results:  Recent Labs  11/14/15 0317 11/15/15 0445  WBC 9.6 7.6  HGB 9.5* 8.9*  HCT 32.3* 30.6*  PLT 182 178   BMET:  Recent Labs  11/14/15 0317 11/15/15 0445  NA 139 138  K 4.1 4.5  CL 97* 97*  CO2 32 31  GLUCOSE 142* 123*  BUN 11 14  CREATININE 0.81 1.10*  CALCIUM 9.0 9.0    PT/INR:  Recent Labs  11/13/15 0302  LABPROT 16.2*  INR 1.29   ABG    Component Value Date/Time   PHART 7.420 11/14/2015 0345   HCO3 31.5* 11/14/2015 0345   TCO2 33.0 11/14/2015 0345   ACIDBASEDEF 3.0* 04/26/2015 0832   O2SAT 98.4 11/14/2015 0345   CBG (last 3)  No results for input(s): GLUCAP in the last 72 hours.  Meds Scheduled Meds: . acetaminophen  1,000 mg Oral 4 times per day   Or  . acetaminophen (TYLENOL) oral liquid 160 mg/5 mL  1,000 mg Oral 4 times per day  . aspirin  EC  81 mg Oral Daily  . bisacodyl  10 mg Oral Daily  . bisoprolol  10 mg Oral Daily  . ceFEPime (MAXIPIME) IV  2 g Intravenous Q12H  . docusate sodium  100 mg Oral BID  . enoxaparin (LOVENOX) injection  40 mg Subcutaneous Daily  . furosemide  20 mg Oral BID  . gabapentin  100 mg Oral BID  . levothyroxine  88 mcg Oral QAC breakfast  . losartan  100 mg Oral Daily  . pantoprazole  40 mg Oral BID  . potassium chloride SA  20 mEq Oral Daily  . senna-docusate  1 tablet Oral QHS  . simvastatin  20 mg Oral Daily  . sodium chloride flush  3 mL Intravenous Q12H  . spironolactone  25 mg Oral Daily  . vancomycin  1,000 mg Intravenous Q24H   Continuous Infusions: . lactated ringers 1 mL (11/13/15 1820)   PRN Meds:.sodium chloride, acetaminophen **OR** acetaminophen, benzonatate, fluticasone, ipratropium-albuterol, magnesium hydroxide, morphine injection, ondansetron **OR** ondansetron (ZOFRAN) IV, ondansetron (ZOFRAN) IV, oxyCODONE, oxyCODONE, potassium chloride, sodium chloride flush, sodium chloride flush, traMADol  Xrays Dg Chest Port 1 View  11/14/2015  CLINICAL DATA:  Evaluate placement of right PICC EXAM: PORTABLE CHEST 1 VIEW COMPARISON:  11/13/2015  FINDINGS: Tip of the new right-sided PICC projects in the lower superior vena cava. Cardiac silhouette is enlarged. Changes from cardiac surgery and valve replacements is stable. No mediastinal or hilar masses or adenopathy. Lungs are essentially clear.  No pleural effusion.  No pneumothorax. IMPRESSION: 1. Right PICC is well positioned with its tip in the lower superior vena cava. 2. No other change from prior study. No acute cardiopulmonary disease. Electronically Signed   By: Lajean Manes M.D.   On: 11/14/2015 19:08   Dg Chest Port 1 View  11/13/2015  CLINICAL DATA:  Incisional drainage of right chest wall mass. EXAM: PORTABLE CHEST 1 VIEW COMPARISON:  CT chest 11/10/2015 and chest radiograph 05/22/2015. FINDINGS: Trachea is midline. Heart is  enlarged. Pacemaker lead tips are stable in position, in the right atrium and right ventricle. Left atrial appendage clip is in place with evidence of mitral and tricuspid valve repair. Linear scarring or atelectasis in the midlung zones bilaterally. Calcified granuloma in the lateral right midlung zone. Lungs are otherwise clear. No pleural fluid. No pneumothorax. IMPRESSION: No acute findings. Electronically Signed   By: Lorin Picket M.D.   On: 11/13/2015 16:57    Assessment/Plan: S/P Procedure(s) (LRB): Excisional drainage of RIGHT Chest wall mass and breast mass  (N/A) APPLICATION OF WOUND VAC (N/A)  1 stable 2 vac in place, plastics will plan timing of flap 3 picc in place- IV abx adjusted by ID 4 no fevers or leukocytosis 5 paced rhythm, hemodyn stable  LOS: 5 days    GOLD,WAYNE E 11/15/2015  I have seen and examined the patient and agree with the assessment and plan as outlined.    Rexene Alberts, MD 11/15/2015 9:19 AM

## 2015-11-15 NOTE — Care Management Note (Signed)
Case Management Note  Patient Details  Name: [name] MRN: [id number] Date of Birth: 02-08-1935  Subjective/Objective:                    Action/Plan:Made Pam with [facility] aware of referral for [facility] antibiotics. Will follow up on 11/16/2015.    Expected Discharge Date:                  Expected Discharge Plan:     In-House Referral:     Discharge planning Services  CM Consult  Post Acute Care Choice:    Choice offered to:     DME Arranged:    DME Agency:     HH Arranged:  RN [place] Agency:  [place]  Status of Service:  In process, will continue to follow  Medicare Important Message Given:  Yes Date Medicare IM Given:    Medicare IM give by:    Date Additional Medicare IM Given:    Additional Medicare Important Message give by:     If discussed at [place] of Stay Meetings, dates discussed:    Additional Comments:  Delrae Sawyers, RN 11/15/2015, 4:49 PM

## 2015-11-15 NOTE — Consult Note (Signed)
WOC wound consult note Reason for Consult: Consult requested for Vac dressing change.  Dr Roxy Manns and Dr Marla Roe at bedside to assess wound and discuss plan of care. Wound type: Full thickness post-op Measurement: 5X9X2.5 Wound bed: Beefy red Drainage (amount, consistency, odor) Small amt red drainage, no odor Periwound: Intact skin surrounding Dressing procedure/placement/frequency: Applied one piece black foam to 161mm cont suction.  Pt tolerated with minimal amt discomfort after meds given earlier. Plans to go to the OR with Plastics team for wound closure. Julien Girt MSN, RN, [place], [place], [place]

## 2015-11-16 LAB — VANCOMYCIN, TROUGH: Vancomycin Tr: 11 ug/mL (ref 10.0–20.0)

## 2015-11-16 NOTE — Progress Notes (Signed)
Patient ID: [name], female   DOB: 1935-02-05, 81 y.o.   MRN: [id number]         [place] for Infectious Disease    Date of Admission:  11/10/2015    Total days of antibiotics 17        Day 3 vancomycin        Day 3 cefepime         Principal Problem:   Wound infection after surgery Active Problems:   S/P mitral valve replacement with bioprosthetic valve, tricuspid valve repair, maze procedure and CABG x1   . acetaminophen  1,000 mg Oral 4 times per day   Or  . acetaminophen (TYLENOL) oral liquid 160 mg/5 mL  1,000 mg Oral 4 times per day  . aspirin EC  81 mg Oral Daily  . bisacodyl  10 mg Oral Daily  . bisoprolol  10 mg Oral Daily  . ceFEPime (MAXIPIME) IV  2 g Intravenous Q12H  . docusate sodium  100 mg Oral BID  . enoxaparin (LOVENOX) injection  40 mg Subcutaneous Daily  . furosemide  20 mg Oral BID  . gabapentin  100 mg Oral BID  . levothyroxine  88 mcg Oral QAC breakfast  . losartan  100 mg Oral Daily  . pantoprazole  40 mg Oral BID  . potassium chloride SA  20 mEq Oral Daily  . senna-docusate  1 tablet Oral QHS  . simvastatin  20 mg Oral Daily  . sodium chloride flush  3 mL Intravenous Q12H  . spironolactone  25 mg Oral Daily  . vancomycin  1,000 mg Intravenous Q24H    SUBJECTIVE: She is bored but otherwise feeling a little bit better.  Review of Systems: Review of Systems  Constitutional: Negative for fever, chills and diaphoresis.  Respiratory: Negative for cough, sputum production and shortness of breath.   Cardiovascular: Positive for chest pain.  Gastrointestinal: Negative for nausea, vomiting and diarrhea.    Past Medical History  Diagnosis Date  . Hypertension   . COPD (chronic obstructive pulmonary disease) ([place])   . Hyperlipidemia   . Meniere disease   . PAF (paroxysmal atrial fibrillation) ([place])   . COPD (chronic obstructive pulmonary disease) ([place])   . Hypothyroidism   . Pulmonary hypertension ([place])   . Chronic diastolic CHF  (congestive heart failure) ([place])   . PONV (postoperative nausea and vomiting)   . Anxiety   . GERD (gastroesophageal reflux disease)   . Severe Mitral Regurgitation s/p MV Repair     a. 04/2015 s/p 27 mm [facility] Mitral bovine bioprosthetic tissue valve  . Tricuspid Regurgitation s/p Repair     a. 04/2015 s/p 26 mm Edwards mc3 ring annuloplasty  . 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 04/2015 Complete bilateral atrial lesion set using cryothermy and bipolar radiofrequency ablation with clipping of LA appendage  . CAD s/p CABG x 1     a. 04/2015 LIMA to LAD  . Post-surgical complete heart block, symptomatic     a. 04/2015 s/p MDT [id number] Advisa DR MRI DC PPM.  . Wound infection ([place]) 10/13/2015    Superficial sternal wound infection  . Cancer of right breast ([place]) 08/09/2001  . Presence of permanent cardiac pacemaker   . On home oxygen therapy     "2L at night" (11/10/2015)  . Pneumonia ~ 2010  . Arthritis     "hands" (11/10/2015)    Social History  Substance Use Topics  .  Smoking status: Former Smoker -- 1.50 packs/day for 40 years    Types: Cigarettes    Quit date: 04/20/1998  . Smokeless tobacco: Never Used  . Alcohol Use: Yes     Comment: 11/10/2015 "glass of wine a few times/year, if that"    Family History  Problem Relation Age of Onset  . Heart disease Father   . Heart disease Brother   . Heart attack Paternal Uncle    Allergies  Allergen Reactions  . Ace Inhibitors Cough    Cough  . Augmentin [Amoxicillin-Pot Clavulanate] Swelling    Swollen joints, pain  . Penicillins Swelling    Swollen joints  . Nifedipine Other (See Comments)    Unknown  . Propranolol Other (See Comments)     Unknown  . Diazepam Other (See Comments)    Made her "hyper"  . Meperidine Other (See Comments)    Made her "hyper"  . Propoxyphene Other (See Comments)    Made her "hyper"    OBJECTIVE: Filed Vitals:   11/15/15 1346 11/15/15 2039 11/16/15 0240 11/16/15 0430  BP: 114/53  114/46  111/43  Pulse: 67 60  60  Temp: 97.8 F (36.6 C) 98.1 F (36.7 C)  97.8 F (36.6 C)  TempSrc: Oral Oral  Oral  Resp: 18 16  18   Height:      Weight:   133 lb 9.6 oz (60.601 kg)   SpO2: 96% 99%  100%   Body mass index is 24.84 kg/(m^2).  Physical Exam  Constitutional:  She is alert and in good spirits. She is sitting up in bed reading a book.  Cardiovascular: Normal rate and regular rhythm.   No murmur heard. Pulmonary/Chest: Breath sounds normal.  Pacemaker site looks good. Vac dressing in place over her lower sternum.  Skin: No rash noted.  Psychiatric: Mood and affect normal.    Lab Results Lab Results  Component Value Date   WBC 7.6 11/15/2015   HGB 8.9* 11/15/2015   HCT 30.6* 11/15/2015   MCV 87.4 11/15/2015   PLT 178 11/15/2015    Lab Results  Component Value Date   CREATININE 1.10* 11/15/2015   BUN 14 11/15/2015   NA 138 11/15/2015   K 4.5 11/15/2015   CL 97* 11/15/2015   CO2 31 11/15/2015    Lab Results  Component Value Date   ALT 18 11/15/2015   AST 18 11/15/2015   ALKPHOS 69 11/15/2015   BILITOT 0.8 11/15/2015    SED RATE (mm/hr)  Date Value  11/15/2015 79*   CRP (mg/dL)  Date Value  11/15/2015 3.2*   Microbiology: Recent Results (from the past 240 hour(s))  Culture, blood (Routine X 2) w Reflex to ID Panel     Status: None   Collection Time: 11/10/15 11:45 AM  Result Value Ref Range Status   Specimen Description BLOOD BLOOD LEFT WRIST  Final   Special Requests BOTTLES DRAWN AEROBIC ONLY 6CC  Final   Culture NO GROWTH 5 DAYS  Final   Report Status 11/15/2015 FINAL  Final  Culture, blood (Routine X 2) w Reflex to ID Panel     Status: None   Collection Time: 11/10/15 11:55 AM  Result Value Ref Range Status   Specimen Description BLOOD LEFT ANTECUBITAL  Final   Special Requests BOTTLES DRAWN AEROBIC AND ANAEROBIC 5CC  Final   Culture NO GROWTH 5 DAYS  Final   Report Status 11/15/2015 FINAL  Final  Acid Fast Smear (AFB)  Status: None   Collection Time: 11/10/15 12:05 PM  Result Value Ref Range Status   AFB Specimen Processing Concentration  Final   Acid Fast Smear Negative  Final    Comment: (NOTE) Performed At: [facility] [place], [place] [id number] Lindon Romp MD [id number]    Source (AFB) WOUND  Final    Comment: STERNUM  Fungus Culture With Stain     Status: None (Preliminary result)   Collection Time: 11/10/15 12:05 PM  Result Value Ref Range Status   Fungus Stain Final report  Final    Comment: (NOTE) Performed At: [facility] [place], [place] [id number] Lindon Romp MD [id number]    Fungus (Mycology) Culture PENDING  Incomplete   Fungal Source WOUND  Final    Comment: STERNUM  Fungus Culture Result     Status: None   Collection Time: 11/10/15 12:05 PM  Result Value Ref Range Status   Result 1 Comment  Final    Comment: (NOTE) KOH/Calcofluor preparation:  no fungus observed. Performed At: [facility] [place], [place] [id number] Lindon Romp MD [id number]   Culture, routine-abscess     Status: None   Collection Time: 11/10/15 12:10 PM  Result Value Ref Range Status   Specimen Description WOUND STERNUM  Final   Special Requests WOUND FROM PREVIOUS STERNOTOMY  Final   Gram Stain   Final    NO WBC SEEN NO SQUAMOUS EPITHELIAL CELLS SEEN NO ORGANISMS SEEN Performed at [facility]    Culture   Final    NO GROWTH 2 DAYS Performed at [facility]    Report Status 11/12/2015 FINAL  Final  Surgical PCR screen     Status: None   Collection Time: 11/10/15  4:32 PM  Result Value Ref Range Status   MRSA, PCR NEGATIVE NEGATIVE Final   Staphylococcus aureus NEGATIVE NEGATIVE Final    Comment:        The Xpert SA Assay (FDA approved for NASAL specimens in patients over 35 years of age), is one component of a comprehensive surveillance program.  Test performance has been  validated by [facility] for patients greater than or equal to 85 year old. It is not intended to diagnose infection nor to guide or monitor treatment.   Anaerobic culture     Status: None (Preliminary result)   Collection Time: 11/13/15  3:00 PM  Result Value Ref Range Status   Specimen Description TISSUE  Final   Special Requests   Final    COSTAL CARTILAGE AND CHEST PT ON VANC ZINACEF AND CIPRO   Gram Stain   Final    NO WBC SEEN NO ORGANISMS SEEN Performed at [facility]    Culture   Final    NO ANAEROBES ISOLATED; CULTURE IN PROGRESS FOR 5 DAYS Performed at [facility]    Report Status PENDING  Incomplete  Tissue culture     Status: None (Preliminary result)   Collection Time: 11/13/15  3:00 PM  Result Value Ref Range Status   Specimen Description TISSUE  Final   Special Requests   Final    COSTAL CARTILAGE AND CHEST PT ON VANC ZINACEF AND CIPRO   Gram Stain   Final    NO WBC SEEN NO ORGANISMS SEEN Performed at [facility]    Culture   Final    NO GROWTH 2 DAYS Performed at [facility]  Report Status PENDING  Incomplete  Acid Fast Smear (AFB)     Status: None   Collection Time: 11/13/15  3:00 PM  Result Value Ref Range Status   AFB Specimen Processing Comment  Final    Comment: Tissue Grinding and Digestion/Decontamination   Acid Fast Smear Negative  Final    Comment: (NOTE) Performed At: [facility] [place], [place] [id number] Lindon Romp MD [id number]    Source (AFB) TISSUE  Final    Comment: COSTAL CARTILAGE AND CHEST     ASSESSMENT: All recent operative stains and cultures are negative. I plan to continue IV vancomycin and cefepime at least through next week surgery. She may benefit from a longer course of therapy depending on operative findings.  PLAN: 1. Continue vancomycin and cefepime 2. I will follow-up on Monday, 11/20/2015  Michel Bickers, MD [facility] [phone] pager   [phone] cell 11/16/2015, 9:44 AM

## 2015-11-16 NOTE — Care Management Note (Addendum)
Case Management Note  Patient Details  Name: [name] MRN: [id number] Date of Birth: 11-29-1934  Subjective/Objective:  81 y.o. F s/p MVR with Bioprosthetic Valve, CABG x 1, Maze Procedure Tricuspid Valve Replacement. Will be receiving HHRN and IV abx via [place], per pt, at discharge. pt is expected to remain in hospital until after Flap surgery next week as there is concern for infection per Infectious Disease. Pt has Wound Vac. ID to follow up on 11/20/2015.                   Action/Plan: Pam with [facility] is following for infusion therapy. Notified Manuela Schwartz of  Anticipated [place] need.   Expected Discharge Date:                  Expected Discharge Plan:     In-House Referral:     Discharge planning Services  CM Consult  Post Acute Care Choice:    Choice offered to:     DME Arranged:    DME Agency:     HH Arranged:  RN [place] Agency:  [place]  Status of Service:  In process, will continue to follow  Medicare Important Message Given:  Yes Date Medicare IM Given:    Medicare IM give by:    Date Additional Medicare IM Given:    Additional Medicare Important Message give by:     If discussed at [place] of Stay Meetings, dates discussed: 11/16/2015   Additional Comments:  Delrae Sawyers, RN 11/16/2015, 11:39 AM

## 2015-11-16 NOTE — Progress Notes (Signed)
Pharmacy Antibiotic Note  [name] is a 81 y.o. female admitted on 11/10/2015 with wound infection. Pt on Cefepime (Day #2)and Vancomycin (Day #7). ID following.    Vancomycin trough 11 mcg/ml (therapeutic) on 1gm IV q24h  Goals:  Vancomycin trough 10-15  Plan:  Continue Vancomycin 1g IV Q24h  F/U c/s, renal fxn, VT weekly, LOT  Temp (24hrs), Avg:98 F (36.7 C), Min:97.8 F (36.6 C), Max:98.2 F (36.8 C)   Recent Labs Lab 11/10/15 1155 11/13/15 0709 11/14/15 0317 11/15/15 0445 11/16/15 0218  WBC 9.3 9.3 9.6 7.6  --   CREATININE 0.79 0.79 0.81 1.10*  --   VANCOTROUGH  --   --   --   --  11    Estimated Creatinine Clearance: 35.7 mL/min (by C-G formula based on Cr of 1.1).    Allergies  Allergen Reactions  . Ace Inhibitors Other (See Comments)    Cough  . Augmentin [Amoxicillin-Pot Clavulanate] Swelling    Swollen joints, pain  . Penicillins Swelling    Swollen joints  . Nifedipine Other (See Comments)    Unknown  . Propranolol Other (See Comments)     Unknown  . Diazepam Other (See Comments)    Made her "hyper"  . Meperidine Other (See Comments)    Made her "hyper"  . Propoxyphene Other (See Comments)    Made her "hyper"    Antimicrobials this admission: 3/2 vanc>> 3/3 Cipro (PTA)>>3/7 3/7 Cefepime>>  Micro: 3/3 blood x2>> neg 3/3 Sternal wound >> neg 3/3 MRSA PCR: neg 4/6 costal cartilage tissus>>ngtd  Sherlon Handing, PharmD, BCPS Clinical pharmacist, pager [phone] 11/16/2015 3:16 AM

## 2015-11-16 NOTE — Progress Notes (Signed)
      [address]       [place] [address]             [phone]      3 Days Post-Op Procedure(s) (LRB): Excisional drainage of RIGHT Chest wall mass and breast mass  (N/A) APPLICATION OF WOUND VAC (N/A)   Subjective:  Ms. Gaier has no complaints.  She is anxious to get her flap done.  Her husband is at bedside.  Objective: Vital signs in last 24 hours: Temp:  [97.8 F (36.6 C)-98.1 F (36.7 C)] 97.8 F (36.6 C) (03/09 0430) Pulse Rate:  [60-67] 60 (03/09 0430) Cardiac Rhythm:  [-] Atrial paced (03/08 1900) Resp:  [16-18] 18 (03/09 0430) BP: (111-114)/(43-53) 111/43 mmHg (03/09 0430) SpO2:  [96 %-100 %] 100 % (03/09 0430) Weight:  [133 lb 9.6 oz (60.601 kg)] 133 lb 9.6 oz (60.601 kg) (03/09 0240)  Intake/Output from previous day: 03/08 0701 - 03/09 0700 In: 1120 [P.O.:960; I.V.:10; IV Piggyback:50] Out: 825 [Urine:800; Drains:25]  General appearance: alert, cooperative and no distress Heart: regular rate and rhythm and paced Lungs: clear to auscultation bilaterally Wound: wound vac in place  Lab Results:  Recent Labs  11/14/15 0317 11/15/15 0445  WBC 9.6 7.6  HGB 9.5* 8.9*  HCT 32.3* 30.6*  PLT 182 178   BMET:  Recent Labs  11/14/15 0317 11/15/15 0445  NA 139 138  K 4.1 4.5  CL 97* 97*  CO2 32 31  GLUCOSE 142* 123*  BUN 11 14  CREATININE 0.81 1.10*  CALCIUM 9.0 9.0    PT/INR: No results for input(s): LABPROT, INR in the last 72 hours. ABG    Component Value Date/Time   PHART 7.420 11/14/2015 0345   HCO3 31.5* 11/14/2015 0345   TCO2 33.0 11/14/2015 0345   ACIDBASEDEF 3.0* 04/26/2015 0832   O2SAT 98.4 11/14/2015 0345   CBG (last 3)  No results for input(s): GLUCAP in the last 72 hours.  Assessment/Plan: S/P Procedure(s) (LRB): Excisional drainage of RIGHT Chest wall mass and breast mass  (N/A) APPLICATION OF WOUND VAC (N/A)  1. Delayed sternal wound infection- S/P Debridement, wound vac is in place- appreciate  Plastic surgery consult- planning for VRAM  2. ID- concerned for possible smoldering infection- continue ABX per recommendations... Remains afebrile 3. Cv- remains hemodynamically stable, paced 4. Dispo- patient stable, continue current care, VRAM timing per plastics  LOS: 6 days    Ahmed Prima, Junie Panning 11/16/2015

## 2015-11-17 ENCOUNTER — Other Ambulatory Visit: Payer: Self-pay | Admitting: Plastic Surgery

## 2015-11-17 DIAGNOSIS — S21101A Unspecified open wound of right front wall of thorax without penetration into thoracic cavity, initial encounter: Secondary | ICD-10-CM

## 2015-11-17 LAB — BASIC METABOLIC PANEL
ANION GAP: 9 (ref 5–15)
BUN: 12 mg/dL (ref 6–20)
CO2: 31 mmol/L (ref 22–32)
Calcium: 9.1 mg/dL (ref 8.9–10.3)
Chloride: 97 mmol/L — ABNORMAL LOW (ref 101–111)
Creatinine, Ser: 0.79 mg/dL (ref 0.44–1.00)
GFR calc Af Amer: >60 mL/min (ref >60)
GFR calc non Af Amer: >60 mL/min (ref >60)
GLUCOSE: 159 mg/dL — AB (ref 65–99)
POTASSIUM: 4.3 mmol/L (ref 3.5–5.1)
Sodium: 137 mmol/L (ref 135–145)

## 2015-11-17 LAB — TISSUE CULTURE
CULTURE: NO GROWTH
GRAM STAIN: NONE SEEN

## 2015-11-17 MED ORDER — CIPROFLOXACIN IN D5W 400 MG/200ML IV SOLN
400.0000 mg | INTRAVENOUS | Status: AC
  Administered 2015-11-18: 400 mg via INTRAVENOUS
  Filled 2015-11-17: qty 200

## 2015-11-17 NOTE — Consult Note (Signed)
WOC wound consult note Reason for Consult: Vac dressing change performed. Pt states she will be going to the OR tomorrow for closure with the Plastics team Wound unchanged in appearance since previous consult. Drainage (amount, consistency, odor) Small amt red drainage, no odor Periwound: Intact skin surrounding Dressing procedure/placement/frequency: Applied one piece black foam to 125mm cont suction. Pt tolerated with minimal amt discomfort after meds given earlier.  Please refer to plastics team for further plan of care post-op Please re-consult if further assistance is needed.  Thank-you,  Julien Girt MSN, [place], [place], [place], [place]

## 2015-11-17 NOTE — Anesthesia Preprocedure Evaluation (Addendum)
Anesthesia Evaluation  Patient identified by MRN, date of birth, ID band Patient awake    Reviewed: Allergy & Precautions, NPO status , Patient's Chart, lab work & pertinent test results  History of Anesthesia Complications (+) PONV and history of anesthetic complications  Airway Mallampati: II  TM Distance: >3 FB Neck ROM: Full    Dental no notable dental hx. (+) Dental Advisory Given, Teeth Intact   Pulmonary COPD,  oxygen dependent, former smoker,    Pulmonary exam normal breath sounds clear to auscultation       Cardiovascular hypertension, Pt. on medications + CAD, + CABG and +CHF  Normal cardiovascular exam+ dysrhythmias Atrial Fibrillation + pacemaker  Rhythm:Regular Rate:Normal     Neuro/Psych PSYCHIATRIC DISORDERS Anxiety negative neurological ROS  negative psych ROS   GI/Hepatic Neg liver ROS, GERD  Medicated,  Endo/Other  negative endocrine ROSHypothyroidism   Renal/GU negative Renal ROS  negative genitourinary   Musculoskeletal negative musculoskeletal ROS (+) Arthritis ,   Abdominal   Peds negative pediatric ROS (+)  Hematology negative hematology ROS (+)   Anesthesia Other Findings   Reproductive/Obstetrics negative OB ROS                          Anesthesia Physical Anesthesia Plan  ASA: III  Anesthesia Plan: General   Post-op Pain Management:    Induction: Intravenous  Airway Management Planned: Oral ETT  Additional Equipment: Arterial line  Intra-op Plan:   Post-operative Plan: Possible Post-op intubation/ventilation  Informed Consent: I have reviewed the patients History and Physical, chart, labs and discussed the procedure including the risks, benefits and alternatives for the proposed anesthesia with the patient or authorized representative who has indicated his/her understanding and acceptance.   Dental advisory given  Plan Discussed with: CRNA and  Surgeon  Anesthesia Plan Comments:        Anesthesia Quick Evaluation

## 2015-11-17 NOTE — Care Management Important Message (Signed)
Important Message  Patient Details  Name: [name] MRN: [id number] Date of Birth: 02-Dec-1934   Medicare Important Message Given:  Yes    Granite Godman Abena 11/17/2015, 11:46 AM

## 2015-11-17 NOTE — Progress Notes (Addendum)
[address]       [place] [address]             [phone]      4 Days Post-Op Procedure(s) (LRB): Excisional drainage of RIGHT Chest wall mass and breast mass  (N/A) APPLICATION OF WOUND VAC (N/A) Subjective: Feels ok, vac just changed  Objective: Vital signs in last 24 hours: Temp:  [97.6 F (36.4 C)-98 F (36.7 C)] 97.8 F (36.6 C) (03/10 0429) Pulse Rate:  [61-72] 72 (03/10 0429) Cardiac Rhythm:  [-] Atrial paced (03/09 1900) Resp:  [16-18] 16 (03/10 0429) BP: (89-148)/(50-77) 148/60 mmHg (03/10 0429) SpO2:  [94 %-100 %] 100 % (03/10 0429) Weight:  [132 lb 14.4 oz (60.283 kg)] 132 lb 14.4 oz (60.283 kg) (03/10 0429)  Hemodynamic parameters for last 24 hours:    Intake/Output from previous day: 03/09 0701 - 03/10 0700 In: 785 [P.O.:725; I.V.:10; IV Piggyback:50] Out: [id number] [Urine:1450; Drains:10] Intake/Output this shift:    General appearance: alert, cooperative and no distress Heart: regular rate and rhythm Lungs: clear to auscultation bilaterally Abdomen: benign Extremities: no edema Wound: vac in place  Lab Results:  Recent Labs  11/15/15 0445  WBC 7.6  HGB 8.9*  HCT 30.6*  PLT 178   BMET:  Recent Labs  11/15/15 0445 11/17/15 0420  NA 138 137  K 4.5 4.3  CL 97* 97*  CO2 31 31  GLUCOSE 123* 159*  BUN 14 12  CREATININE 1.10* 0.79  CALCIUM 9.0 9.1    PT/INR: No results for input(s): LABPROT, INR in the last 72 hours. ABG    Component Value Date/Time   PHART 7.420 11/14/2015 0345   HCO3 31.5* 11/14/2015 0345   TCO2 33.0 11/14/2015 0345   ACIDBASEDEF 3.0* 04/26/2015 0832   O2SAT 98.4 11/14/2015 0345   CBG (last 3)  No results for input(s): GLUCAP in the last 72 hours.  Meds Scheduled Meds: . acetaminophen  1,000 mg Oral 4 times per day   Or  . acetaminophen (TYLENOL) oral liquid 160 mg/5 mL  1,000 mg Oral 4 times per day  . aspirin EC  81 mg Oral Daily  . bisacodyl  10 mg Oral Daily  . bisoprolol  10 mg  Oral Daily  . ceFEPime (MAXIPIME) IV  2 g Intravenous Q12H  . docusate sodium  100 mg Oral BID  . enoxaparin (LOVENOX) injection  40 mg Subcutaneous Daily  . furosemide  20 mg Oral BID  . gabapentin  100 mg Oral BID  . levothyroxine  88 mcg Oral QAC breakfast  . losartan  100 mg Oral Daily  . pantoprazole  40 mg Oral BID  . potassium chloride SA  20 mEq Oral Daily  . senna-docusate  1 tablet Oral QHS  . simvastatin  20 mg Oral Daily  . sodium chloride flush  3 mL Intravenous Q12H  . spironolactone  25 mg Oral Daily  . vancomycin  1,000 mg Intravenous Q24H   Continuous Infusions:  PRN Meds:.sodium chloride, acetaminophen **OR** acetaminophen, benzonatate, fluticasone, ipratropium-albuterol, lactulose, magnesium hydroxide, morphine injection, ondansetron **OR** ondansetron (ZOFRAN) IV, ondansetron (ZOFRAN) IV, oxyCODONE, oxyCODONE, potassium chloride, sodium chloride flush, sodium chloride flush, traMADol  Xrays No results found.  Assessment/Plan: S/P Procedure(s) (LRB): Excisional drainage of RIGHT Chest wall mass and breast mass  (N/A) APPLICATION OF WOUND VAC (N/A)  Stable on current treatment- plans as outlined for surgery/conts abx per ID   LOS: 7 days    GOLD,WAYNE  E 11/17/2015  I have seen and examined the patient and agree with the assessment and plan as outlined.  Rexene Alberts, MD 11/17/2015 9:37 AM

## 2015-11-18 ENCOUNTER — Encounter (HOSPITAL_COMMUNITY)
Admission: EM | Disposition: A | Discharge status: Home / Self Care | Payer: Self-pay | Source: Home / Self Care | Attending: Thoracic Surgery (Cardiothoracic Vascular Surgery)

## 2015-11-18 ENCOUNTER — Inpatient Hospital Stay (HOSPITAL_COMMUNITY): Payer: Medicare Other | Admitting: Certified Registered Nurse Anesthetist

## 2015-11-18 ENCOUNTER — Encounter (HOSPITAL_COMMUNITY): Payer: Self-pay | Admitting: Plastic Surgery

## 2015-11-18 HISTORY — PX: TRAM: SHX5363

## 2015-11-18 LAB — ANAEROBIC CULTURE: Gram Stain: NONE SEEN

## 2015-11-18 SURGERY — TRANSVERSE RECTUS ABDOMINIS MYOCUTANEOUS (TRAM)
Anesthesia: General | Site: Chest | Laterality: Right

## 2015-11-18 MED ORDER — EVICEL 5 ML EX KIT
PACK | CUTANEOUS | Status: AC
  Filled 2015-11-18: qty 1

## 2015-11-18 MED ORDER — 0.9 % SODIUM CHLORIDE (POUR BTL) OPTIME
TOPICAL | Status: DC | PRN
  Administered 2015-11-18: 2000 mL

## 2015-11-18 MED ORDER — PHENYLEPHRINE 40 MCG/ML (10ML) SYRINGE FOR IV PUSH (FOR BLOOD PRESSURE SUPPORT)
PREFILLED_SYRINGE | INTRAVENOUS | Status: AC
  Filled 2015-11-18: qty 10

## 2015-11-18 MED ORDER — PROPOFOL 10 MG/ML IV BOLUS
INTRAVENOUS | Status: AC
  Filled 2015-11-18: qty 20

## 2015-11-18 MED ORDER — HYDROMORPHONE HCL 1 MG/ML IJ SOLN
INTRAMUSCULAR | Status: AC
  Administered 2015-11-18: 0.5 mg via INTRAVENOUS
  Filled 2015-11-18: qty 1

## 2015-11-18 MED ORDER — HYDROMORPHONE HCL 1 MG/ML IJ SOLN
0.2500 mg | INTRAMUSCULAR | Status: DC | PRN
  Administered 2015-11-18 (×4): 0.5 mg via INTRAVENOUS

## 2015-11-18 MED ORDER — LACTATED RINGERS IV SOLN
INTRAVENOUS | Status: DC | PRN
  Administered 2015-11-18 (×2): via INTRAVENOUS

## 2015-11-18 MED ORDER — NEOSTIGMINE METHYLSULFATE 10 MG/10ML IV SOLN
INTRAVENOUS | Status: AC
  Filled 2015-11-18: qty 5

## 2015-11-18 MED ORDER — GLYCOPYRROLATE 0.2 MG/ML IJ SOLN
INTRAMUSCULAR | Status: AC
  Filled 2015-11-18: qty 2

## 2015-11-18 MED ORDER — SODIUM CHLORIDE 0.9 % IR SOLN
Status: DC | PRN
  Administered 2015-11-18: 500 mL

## 2015-11-18 MED ORDER — ROCURONIUM BROMIDE 50 MG/5ML IV SOLN
INTRAVENOUS | Status: AC
Start: 2015-11-18 — End: 2015-11-18
  Filled 2015-11-18: qty 1

## 2015-11-18 MED ORDER — PHENYLEPHRINE HCL 10 MG/ML IJ SOLN
10.0000 mg | INTRAVENOUS | Status: DC | PRN
  Administered 2015-11-18: 20 ug/min via INTRAVENOUS

## 2015-11-18 MED ORDER — BUPIVACAINE-EPINEPHRINE (PF) 0.25% -1:200000 IJ SOLN
INTRAMUSCULAR | Status: DC | PRN
  Administered 2015-11-18: 13 mL via PERINEURAL

## 2015-11-18 MED ORDER — SUCCINYLCHOLINE CHLORIDE 20 MG/ML IJ SOLN
INTRAMUSCULAR | Status: AC
  Filled 2015-11-18: qty 1

## 2015-11-18 MED ORDER — PROPOFOL 10 MG/ML IV BOLUS
INTRAVENOUS | Status: DC | PRN
  Administered 2015-11-18: 80 mg via INTRAVENOUS

## 2015-11-18 MED ORDER — EPHEDRINE SULFATE 50 MG/ML IJ SOLN
INTRAMUSCULAR | Status: AC
  Filled 2015-11-18: qty 1

## 2015-11-18 MED ORDER — ONDANSETRON HCL 4 MG/2ML IJ SOLN
INTRAMUSCULAR | Status: DC | PRN
Start: 2015-11-18 — End: 2015-11-18
  Administered 2015-11-18: 4 mg via INTRAVENOUS

## 2015-11-18 MED ORDER — BUPIVACAINE-EPINEPHRINE (PF) 0.25% -1:200000 IJ SOLN
INTRAMUSCULAR | Status: AC
  Filled 2015-11-18: qty 30

## 2015-11-18 MED ORDER — FENTANYL CITRATE (PF) 250 MCG/5ML IJ SOLN
INTRAMUSCULAR | Status: AC
  Filled 2015-11-18: qty 5

## 2015-11-18 MED ORDER — NEOSTIGMINE METHYLSULFATE 10 MG/10ML IV SOLN
INTRAVENOUS | Status: DC | PRN
  Administered 2015-11-18: 3 mg via INTRAVENOUS

## 2015-11-18 MED ORDER — ROCURONIUM BROMIDE 100 MG/10ML IV SOLN
INTRAVENOUS | Status: DC | PRN
  Administered 2015-11-18: 10 mg via INTRAVENOUS
  Administered 2015-11-18: 40 mg via INTRAVENOUS

## 2015-11-18 MED ORDER — GLYCOPYRROLATE 0.2 MG/ML IJ SOLN
INTRAMUSCULAR | Status: DC | PRN
  Administered 2015-11-18: 0.4 mg via INTRAVENOUS

## 2015-11-18 MED ORDER — EVICEL 5 ML EX KIT
PACK | CUTANEOUS | Status: AC
  Filled 2015-11-18: qty 2

## 2015-11-18 MED ORDER — SODIUM CHLORIDE 0.9 % IV SOLN
Freq: Once | INTRAVENOUS | Status: AC
  Administered 2015-11-21: 08:00:00 via INTRAVENOUS

## 2015-11-18 MED ORDER — ALBUMIN HUMAN 5 % IV SOLN
INTRAVENOUS | Status: DC | PRN
  Administered 2015-11-18: 12:00:00 via INTRAVENOUS

## 2015-11-18 MED ORDER — LIDOCAINE HCL (CARDIAC) 20 MG/ML IV SOLN
INTRAVENOUS | Status: AC
  Filled 2015-11-18: qty 5

## 2015-11-18 MED ORDER — FENTANYL CITRATE (PF) 100 MCG/2ML IJ SOLN
INTRAMUSCULAR | Status: DC | PRN
  Administered 2015-11-18: 100 ug via INTRAVENOUS
  Administered 2015-11-18 (×4): 50 ug via INTRAVENOUS

## 2015-11-18 MED ORDER — PROMETHAZINE HCL 25 MG/ML IJ SOLN: 6.2500 mg | INTRAMUSCULAR | Status: DC | PRN

## 2015-11-18 MED ORDER — ONDANSETRON HCL 4 MG/2ML IJ SOLN
INTRAMUSCULAR | Status: AC
  Filled 2015-11-18: qty 2

## 2015-11-18 MED ORDER — PHENYLEPHRINE HCL 10 MG/ML IJ SOLN
INTRAMUSCULAR | Status: DC | PRN
  Administered 2015-11-18 (×2): 40 ug via INTRAVENOUS

## 2015-11-18 MED ORDER — EPHEDRINE SULFATE 50 MG/ML IJ SOLN
INTRAMUSCULAR | Status: DC | PRN
Start: 2015-11-18 — End: 2015-11-18
  Administered 2015-11-18 (×2): 5 mg via INTRAVENOUS

## 2015-11-18 MED ORDER — SODIUM CHLORIDE 0.9% FLUSH
10.0000 mL | INTRAVENOUS | Status: DC | PRN
  Administered 2015-11-21 – 2015-11-27 (×5): 10 mL
  Filled 2015-11-18 (×5): qty 40

## 2015-11-18 SURGICAL SUPPLY — 80 items
APPLIER CLIP 9.375 MED OPEN (MISCELLANEOUS)
BAG DECANTER FOR FLEXI CONT (MISCELLANEOUS) ×3 IMPLANT
BINDER BREAST LRG (GAUZE/BANDAGES/DRESSINGS) ×3 IMPLANT
BINDER BREAST XLRG (GAUZE/BANDAGES/DRESSINGS) IMPLANT
BIOPATCH RED 1 DISK 7.0 (GAUZE/BANDAGES/DRESSINGS) ×6 IMPLANT
BIOPATCH RED 1IN DISK 7.0MM (GAUZE/BANDAGES/DRESSINGS) ×3
BLADE 10 SAFETY STRL DISP (BLADE) ×3 IMPLANT
BLADE SURG 10 STRL SS (BLADE) ×3 IMPLANT
BLADE SURG 15 STRL LF DISP TIS (BLADE) ×1 IMPLANT
BLADE SURG 15 STRL SS (BLADE) ×2
BLADE SURG ROTATE 9660 (MISCELLANEOUS) IMPLANT
CANISTER SUCTION 2500CC (MISCELLANEOUS) IMPLANT
CHLORAPREP W/TINT 26ML (MISCELLANEOUS) ×3 IMPLANT
CLIP APPLIE 9.375 MED OPEN (MISCELLANEOUS) IMPLANT
CLOSURE WOUND 1/2 X4 (GAUZE/BANDAGES/DRESSINGS)
CORDS BIPOLAR (ELECTRODE) IMPLANT
COVER MAYO STAND STRL (DRAPES) IMPLANT
COVER SURGICAL LIGHT HANDLE (MISCELLANEOUS) ×3 IMPLANT
DERMABOND ADVANCED (GAUZE/BANDAGES/DRESSINGS) ×2
DERMABOND ADVANCED .7 DNX12 (GAUZE/BANDAGES/DRESSINGS) ×1 IMPLANT
DRAIN CHANNEL 19F RND (DRAIN) ×9 IMPLANT
DRAPE ORTHO SPLIT 77X108 STRL (DRAPES) ×8
DRAPE PROXIMA HALF (DRAPES) IMPLANT
DRAPE SURG 17X11 SM STRL (DRAPES) IMPLANT
DRAPE SURG ORHT 6 SPLT 77X108 (DRAPES) ×4 IMPLANT
DRAPE WARM FLUID 44X44 (DRAPE) ×6 IMPLANT
DRESSING ALLEVYN LIFE SACRUM (GAUZE/BANDAGES/DRESSINGS) ×3 IMPLANT
DRSG MEPILEX BORDER 4X8 (GAUZE/BANDAGES/DRESSINGS) ×3 IMPLANT
DRSG PAD ABDOMINAL 8X10 ST (GAUZE/BANDAGES/DRESSINGS) ×6 IMPLANT
ELECT BLADE 4.0 EZ CLEAN MEGAD (MISCELLANEOUS) ×3
ELECT BLADE 6.5 EXT (BLADE) ×3 IMPLANT
ELECT CAUTERY BLADE 6.4 (BLADE) ×3 IMPLANT
ELECT REM PT RETURN 9FT ADLT (ELECTROSURGICAL) ×3
ELECTRODE BLDE 4.0 EZ CLN MEGD (MISCELLANEOUS) ×1 IMPLANT
ELECTRODE REM PT RTRN 9FT ADLT (ELECTROSURGICAL) ×1 IMPLANT
EVACUATOR SILICONE 100CC (DRAIN) ×9 IMPLANT
EVICEL 5ML SEALNT HUMAN B (Miscellaneous) ×6 IMPLANT
GAUZE SPONGE 4X4 12PLY STRL (GAUZE/BANDAGES/DRESSINGS) ×6 IMPLANT
GAUZE XEROFORM 5X9 LF (GAUZE/BANDAGES/DRESSINGS) IMPLANT
GLOVE BIO SURGEON STRL SZ 6.5 (GLOVE) ×10 IMPLANT
GLOVE BIO SURGEONS STRL SZ 6.5 (GLOVE) ×5
GLOVE BIOGEL PI IND STRL 6 (GLOVE) ×1 IMPLANT
GLOVE BIOGEL PI IND STRL 6.5 (GLOVE) ×1 IMPLANT
GLOVE BIOGEL PI INDICATOR 6 (GLOVE) ×2
GLOVE BIOGEL PI INDICATOR 6.5 (GLOVE) ×2
GOWN STRL REUS W/ TWL LRG LVL3 (GOWN DISPOSABLE) ×2 IMPLANT
GOWN STRL REUS W/TWL LRG LVL3 (GOWN DISPOSABLE) ×4
KIT BASIN OR (CUSTOM PROCEDURE TRAY) ×3 IMPLANT
LIGHT WAVEGUIDE WIDE FLAT (MISCELLANEOUS) ×3 IMPLANT
MARKER SKIN DUAL TIP RULER LAB (MISCELLANEOUS) ×3 IMPLANT
NEEDLE HYPO 25GX1X1/2 BEV (NEEDLE) ×3 IMPLANT
NS IRRIG 1000ML POUR BTL (IV SOLUTION) ×6 IMPLANT
PACK GENERAL/GYN (CUSTOM PROCEDURE TRAY) ×3 IMPLANT
PAD ARMBOARD 7.5X6 YLW CONV (MISCELLANEOUS) ×6 IMPLANT
PAD SHARPS MAGNETIC DISPOSAL (MISCELLANEOUS) IMPLANT
SPONGE GAUZE 4X4 12PLY STER LF (GAUZE/BANDAGES/DRESSINGS) ×6 IMPLANT
SPONGE LAP 18X18 X RAY DECT (DISPOSABLE) ×6 IMPLANT
STAPLER VISISTAT 35W (STAPLE) IMPLANT
STRIP CLOSURE SKIN 1/2X4 (GAUZE/BANDAGES/DRESSINGS) IMPLANT
SUT ETHIBOND NAB CTX #1 30IN (SUTURE) ×12 IMPLANT
SUT ETHILON 2 0 FS 18 (SUTURE) IMPLANT
SUT MNCRL AB 3-0 PS2 18 (SUTURE) ×18 IMPLANT
SUT MNCRL AB 4-0 PS2 18 (SUTURE) ×18 IMPLANT
SUT MON AB 5-0 PS2 18 (SUTURE) ×18 IMPLANT
SUT PDS AB 2-0 CT1 27 (SUTURE) IMPLANT
SUT PDS AB 3-0 SH 27 (SUTURE) IMPLANT
SUT SILK 4 0 P 3 (SUTURE) ×3 IMPLANT
SUT SILK 4 0 PS 2 (SUTURE) ×9 IMPLANT
SUT VIC AB 3-0 PS2 18 (SUTURE)
SUT VIC AB 3-0 PS2 18XBRD (SUTURE) IMPLANT
SUT VIC AB 3-0 SH 18 (SUTURE) IMPLANT
SUT VICRYL 4-0 PS2 18IN ABS (SUTURE) ×15 IMPLANT
SYR BULB IRRIGATION 50ML (SYRINGE) ×6 IMPLANT
SYR CONTROL 10ML LL (SYRINGE) ×3 IMPLANT
TOWEL OR 17X24 6PK STRL BLUE (TOWEL DISPOSABLE) ×3 IMPLANT
TOWEL OR 17X26 10 PK STRL BLUE (TOWEL DISPOSABLE) ×3 IMPLANT
TRAY FOLEY CATH 14FRSI W/METER (CATHETERS) ×3 IMPLANT
TUBE CONNECTING 12'X1/4 (SUCTIONS) ×2
TUBE CONNECTING 12X1/4 (SUCTIONS) ×4 IMPLANT
WATER STERILE IRR 1000ML POUR (IV SOLUTION) IMPLANT

## 2015-11-18 NOTE — Progress Notes (Signed)
Flap inspected with Harriette Bouillon nurse receiving on 2W. Flap pale near medial aspect. Massaged gently from lateral to medial as instructed by Dr. Marla Roe. Flap pink. Binder reapplied

## 2015-11-18 NOTE — Anesthesia Procedure Notes (Signed)
Procedure Name: Intubation Date/Time: 11/18/2015 9:02 AM Performed by: Garrison Columbus T Pre-anesthesia Checklist: Patient identified, Emergency Drugs available, Suction available and Patient being monitored Patient Re-evaluated:Patient Re-evaluated prior to inductionOxygen Delivery Method: Circle system utilized Preoxygenation: Pre-oxygenation with 100% oxygen Intubation Type: IV induction Ventilation: Mask ventilation without difficulty Laryngoscope Size: Miller and 2 Grade View: Grade II Tube type: Oral Tube size: 7.0 mm Number of attempts: 1 Airway Equipment and Method: Stylet Placement Confirmation: ETT inserted through vocal cords under direct vision,  positive ETCO2 and breath sounds checked- equal and bilateral Secured at: 20 cm Tube secured with: Tape Dental Injury: Teeth and Oropharynx as per pre-operative assessment

## 2015-11-18 NOTE — Progress Notes (Addendum)
Flap inspected . No bleeding. Lateral aspects appear slightly bruised, medial pale. Massaged gently lateral to medial as instructed by Dr. Marla Roe. Flap became pink. Binder reapplied

## 2015-11-18 NOTE — Op Note (Signed)
DATE OF OPERATION: 11/18/2015  LOCATION: [facility] - inpatient  PREOPERATIVE DIAGNOSIS: Chest wall defect 6 x 12 cm  POSTOPERATIVE DIAGNOSIS: Same  PROCEDURE: 1. Latissimus myocutaneous flap to reconstruct the chest defect  SURGEON: Claire Sanger Dillingham, DO  EBL: 250 cc  SPECIMEN: None  DRAINS: 2 total 1 blake round drains in the back and one in the front  CONDITION: Stable  COMPLICATIONS: None  INDICATION: The patient, [name], is a 81 y.o. 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  She had radiation of the right breast for treatment of breast cancer many years ago which has contributed to the poor healing in the area.    PROCEDURE DETAILS:  The patient was seen on the morning of her surgery and marked out for her flap.  She was given an IV and IV antibiotics. She was then taken to the operating room and given a general anesthetic. A standard time out was performed and all information was confirmed by those in the room. She has SCD's and a foley catheter placed. She was placed into the left lateral decubitus position with all key points padded. She was then prepped and draped in the standard sterile fashion using a chloroprep. The paddle design and position was confirmed. The procedure began by incising the margins of the paddle and dissecting out until all four margins of the muscle were identified. The muscle was then released inferiorly and anteriorly and care was taken not to pick up the serratus anteriorly or the paraspinous muscles posteriorly. The flap was then raised to the scapula and released and rotated medially. Care was taken to protect the vascular pedicle throughout this portion of the procedure. The paddle and muscle looked healthy throughout the case.   The posterior and anterior pockets were then connected in the plane above the muscle. The muscle from the back and the  skin paddle were then rotated into the chest pocket. The flap pedicle was inspected and there was no tension. The back pocket was hemostased and Evicel placed. Two #19 blake round drains were place and secured with 3-0 Silk. The back incision was closed with buried 3-0 Vicryl, followed by 4-0 Monocryl and 5-0 Monocryl.  Dermabond and a protective dressing was applied.   The patient was then repositioned onto her back and the chest was prepped and draped with a betadine. The breast pocket / defect was inspected and hemostases was achieved with electrocautery. Undermining was done in all directions to help decrease the tension.  The muscle was then secured in the defect.  The inferior portion of the muscle was tacked inferior with 3-0 Monocryl.  One drain was placed on this side and secured with 4-0 Silk. The flap was then closed with 3-0 Monocryl deep, followed by 4-0 Monocryl and the skin closed with 5-0 Monocryl.  Dermabond, ABDs and a breast binder was applied.  The patient was allowed to wake up and taken to recovery room in stable condition at the end of the case. The family was notified at the end of the case.

## 2015-11-18 NOTE — Progress Notes (Addendum)
[address]       [place] [address]             [phone]      Day of Surgery Procedure(s) (LRB): VRAM Vertical Rectus Abdominus Muscle Flap (Right) Subjective  Had plastics latissimus flap this am. Some vertigo currently but overall feels pretty well, mild soreness  Objective: Vital signs in last 24 hours: Temp:  [97.5 F (36.4 C)-97.9 F (36.6 C)] 97.8 F (36.6 C) (03/11 1400) Pulse Rate:  [62-80] 64 (03/11 1400) Cardiac Rhythm:  [-] Heart block (03/11 1446) Resp:  [10-21] 21 (03/11 1415) BP: (99-129)/(38-50) 107/42 mmHg (03/11 1415) SpO2:  [90 %-100 %] 96 % (03/11 1415)  Hemodynamic parameters for last 24 hours:    Intake/Output from previous day:   Intake/Output this shift: Total I/O In: 1550 [I.V.:1200; Other:100; IV Piggyback:250] Out: 975 [Urine:775; Blood:200]  General appearance: alert, cooperative and no distress Heart: regular rate and rhythm Lungs: clear to auscultation bilaterally and no wheeze/crackles Abdomen: benign Extremities: PAS in place, no edema Wound: dressings/JP's in place  Lab Results: No results for input(s): WBC, HGB, HCT, PLT in the last 72 hours. BMET:  Recent Labs  11/17/15 0420  NA 137  K 4.3  CL 97*  CO2 31  GLUCOSE 159*  BUN 12  CREATININE 0.79  CALCIUM 9.1    PT/INR: No results for input(s): LABPROT, INR in the last 72 hours. ABG    Component Value Date/Time   PHART 7.420 11/14/2015 0345   HCO3 31.5* 11/14/2015 0345   TCO2 33.0 11/14/2015 0345   ACIDBASEDEF 3.0* 04/26/2015 0832   O2SAT 98.4 11/14/2015 0345   CBG (last 3)  No results for input(s): GLUCAP in the last 72 hours.  Meds Scheduled Meds: . sodium chloride   Intravenous Once  . acetaminophen  1,000 mg Oral 4 times per day   Or  . acetaminophen (TYLENOL) oral liquid 160 mg/5 mL  1,000 mg Oral 4 times per day  . aspirin EC  81 mg Oral Daily  . bisacodyl  10 mg Oral Daily  . bisoprolol  10 mg Oral Daily  . ceFEPime  (MAXIPIME) IV  2 g Intravenous Q12H  . docusate sodium  100 mg Oral BID  . enoxaparin (LOVENOX) injection  40 mg Subcutaneous Daily  . furosemide  20 mg Oral BID  . gabapentin  100 mg Oral BID  . levothyroxine  88 mcg Oral QAC breakfast  . losartan  100 mg Oral Daily  . pantoprazole  40 mg Oral BID  . potassium chloride SA  20 mEq Oral Daily  . senna-docusate  1 tablet Oral QHS  . simvastatin  20 mg Oral Daily  . sodium chloride flush  3 mL Intravenous Q12H  . spironolactone  25 mg Oral Daily  . vancomycin  1,000 mg Intravenous Q24H   Continuous Infusions:  PRN Meds:.sodium chloride, acetaminophen **OR** acetaminophen, benzonatate, fluticasone, ipratropium-albuterol, lactulose, magnesium hydroxide, morphine injection, ondansetron **OR** ondansetron (ZOFRAN) IV, ondansetron (ZOFRAN) IV, oxyCODONE, oxyCODONE, potassium chloride, sodium chloride flush, sodium chloride flush, traMADol  Xrays No results found.  Assessment/Plan: S/P Procedure(s) (LRB): VRAM Vertical Rectus Abdominus Muscle Flap (Right)  Stable post-op Analgesia as needed Cont vanc/maxepime  LOS: 8 days    GOLD,WAYNE E 11/18/2015  Back from surgery today Stable I have seen and examined [name] and agree with the above assessment  and plan.  Grace Isaac MD Beeper [phone] Office [phone] 11/18/2015 4:23  PM   

## 2015-11-18 NOTE — Transfer of Care (Signed)
Immediate Anesthesia Transfer of Care Note  Patient: [name]  Procedure(s) Performed: Procedure(s) with comments: VRAM Vertical Rectus Abdominus Muscle Flap (Right) - RIght Back  Patient Location: PACU  Anesthesia Type:General  Level of Consciousness: awake and alert   Airway & Oxygen Therapy: Patient Spontanous Breathing  Post-op Assessment: Report given to RN, Post -op Vital signs reviewed and stable and Patient moving all extremities X 4  Post vital signs: Reviewed and stable  Last Vitals:  Filed Vitals:   11/18/15 0400 11/18/15 1217  BP: 129/44   Pulse: 62   Temp: 36.6 C 36.4 C  Resp: 19     Complications: No apparent anesthesia complications

## 2015-11-18 NOTE — Brief Op Note (Signed)
11/10/2015 - 11/18/2015  8:44 AM  PATIENT:  [name]  81 y.o. female  PRE-OPERATIVE DIAGNOSIS:  Chest Wound  POST-OPERATIVE DIAGNOSIS:  same  PROCEDURE: Latissimus muscle flap for chest wall reconstruction.  SURGEON:  Surgeon(s) and Role:    * Sydnie Sigmund S Leomia Blake, DO - Primary  ASSISTANTS: none   ANESTHESIA:   general  EBL:     BLOOD ADMINISTERED:none  DRAINS: (2) Jackson-Pratt drain(s) with closed bulb suction in the back   LOCAL MEDICATIONS USED:  MARCAINE     SPECIMEN:  No Specimen  DISPOSITION OF SPECIMEN:  N/A  COUNTS:  YES  TOURNIQUET:  * No tourniquets in log *  DICTATION: .Dragon Dictation  PLAN OF CARE: Admit to inpatient   PATIENT DISPOSITION:  PACU - hemodynamically stable.   Delay start of Pharmacological VTE agent (>24hrs) due to surgical blood loss or risk of bleeding: no

## 2015-11-18 NOTE — Interval H&P Note (Signed)
History and Physical Interval Note:  11/18/2015 7:15 AM  [name]  has presented today for surgery, with the diagnosis of Chest Wound  The various methods of treatment have been discussed with the patient and family. After consideration of risks, benefits and other options for treatment, the patient has consented to  Procedure(s): VRAM Vertical Rectus Abdominus Muscle Flap (N/A) or latissimus muscle flap as a surgical intervention .  The patient's history has been reviewed, patient examined, no change in status, stable for surgery.  I have reviewed the patient's chart and labs.  Questions were answered to the patient's satisfaction.     Wallace Going

## 2015-11-18 NOTE — H&P (View-Only) (Signed)
Reason for Consult:chest wound Referring Physician: Dr. Darylene Price  [name] is an 81 y.o. female.  HPI: The patient is a 81 yrs old wf here for treatment of a chest wound.  She underwent open heart surgery with valve repair, CABG and pace maker placement ~ 8 months ago.  She developed a postoperative wound.  That healed and then she had additional breakdown.  The cultures have been negative but she was treated with antibiotics.  She was taken to the OR this week and there was necrosis of the right sided cartilage.  This is likely due to the breast cancer treatment that included a lumpectomy and radiation over 10 years ago.  The area is ~ 6 x 10 cm at the lower sternal area and right medial inframammary fold area.  The area seems to be granulating well.  No purulence noted.  The surrounding tissue is firm and has the rubbery texture.      Past Medical History  Diagnosis Date  . Hypertension   . COPD (chronic obstructive pulmonary disease) ([place])   . Hyperlipidemia   . Meniere disease   . PAF (paroxysmal atrial fibrillation) ([place])   . COPD (chronic obstructive pulmonary disease) ([place])   . Hypothyroidism   . Pulmonary hypertension ([place])   . Chronic diastolic CHF (congestive heart failure) ([place])   . PONV (postoperative nausea and vomiting)   . Anxiety   . GERD (gastroesophageal reflux disease)   . Severe Mitral Regurgitation s/p MV Repair     a. 04/2015 s/p 27 mm [facility] Mitral bovine bioprosthetic tissue valve  . Tricuspid Regurgitation s/p Repair     a. 04/2015 s/p 26 mm Edwards mc3 ring annuloplasty  . 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 04/2015 Complete bilateral atrial lesion set using cryothermy and bipolar radiofrequency ablation with clipping of LA appendage  . CAD s/p CABG x 1     a. 04/2015 LIMA to LAD  . Post-surgical complete heart block, symptomatic     a. 04/2015 s/p MDT [id number] [facility]visa DR MRI DC PPM.  . Wound infection ([place]) 10/13/2015    Superficial sternal  wound infection  . Cancer of right breast ([place]) 08/09/2001  . Presence of permanent cardiac pacemaker   . On home oxygen therapy     "2L at night" (11/10/2015)  . Pneumonia ~ 2010  . Arthritis     "hands" (11/10/2015)    Past Surgical History  Procedure Laterality Date  . Cochlear implant Left 2005?  Marland Kitchen Cardiac catheterization  11/2013    [facility]  . Cardiac catheterization  10/2014    [facility]  . Mitral valve repair N/A 04/25/2015    Procedure: MITRAL VALVE  REPLACEMENT using a 27 mm Edwards Perimount Magna Mitral Ease Valve;  Surgeon: Rexene Alberts, MD;  Location: [place];  Service: Open Heart Surgery;  Laterality: N/A;  . Tricuspid valve replacement N/A 04/25/2015    Procedure: TRICUSPID VALVE REPAIR;  Surgeon: Rexene Alberts, MD;  Location: [place];  Service: Open Heart Surgery;  Laterality: N/A;  . Maze N/A 04/25/2015    Procedure: MAZE;  Surgeon: Rexene Alberts, MD;  Location: [place];  Service: Open Heart Surgery;  Laterality: N/A;  . Tee without cardioversion N/A 04/25/2015    Procedure: TRANSESOPHAGEAL ECHOCARDIOGRAM (TEE);  Surgeon: Rexene Alberts, MD;  Location: [place];  Service: Open Heart Surgery;  Laterality: N/A;  . Clipping of atrial appendage N/A 04/25/2015  Procedure: CLIPPING OF ATRIAL APPENDAGE;  Surgeon: Rexene Alberts, MD;  Location: [place];  Service: Open Heart Surgery;  Laterality: N/A;  . Ep implantable device N/A 05/02/2015    Procedure: Pacemaker Implant;  Surgeon: Thompson Grayer, MD;  Location: [place] [facility];  Service: Cardiovascular;  Laterality: N/A;  . Sternal wound debridement N/A 05/09/2015    Procedure: STERNAL WOUND DEBRIDEMENT;  Surgeon: Rexene Alberts, MD;  Location: [place];  Service: Thoracic;  Laterality: N/A;  . Application of wound vac N/A 05/09/2015    Procedure: APPLICATION OF WOUND VAC;  Surgeon: Rexene Alberts, MD;  Location: [place];  Service: Thoracic;  Laterality: N/A;  . Sternal wires removal N/A 06/05/2015    Procedure: STERNAL WIRES REMOVAL;  Surgeon:  Rexene Alberts, MD;  Location: [place];  Service: Thoracic;  Laterality: N/A;  . Tonsillectomy    . Breast biopsy Left 11/25/06    neg  . Breast biopsy Left 01/20/12    /clip-neg  . Mastectomy, partial Right 2002  . Vaginal hysterectomy    . Dilation and curettage of uterus  "several before hysterectomy"  . Cataract extraction w/ intraocular lens  implant, bilateral Bilateral 2014  . Cardiac valve replacement    . Insert / replace / remove pacemaker    . Coronary artery bypass graft N/A 04/25/2015    Procedure: CORONARY ARTERY BYPASS GRAFTING (CABG) x ONE, using left internal mammary artery;  Surgeon: Rexene Alberts, MD;  Location: [place];  Service: Open Heart Surgery;  Laterality: N/A;  . Sternal wound debridement N/A 11/13/2015    Procedure: Excisional drainage of RIGHT Chest wall mass and breast mass ;  Surgeon: Rexene Alberts, MD;  Location: [place];  Service: Thoracic;  Laterality: N/A;  . Application of wound vac N/A 11/13/2015    Procedure: APPLICATION OF WOUND VAC;  Surgeon: Rexene Alberts, MD;  Location[facility];  Service: Thoracic;  Laterality: N/A;    Family History  Problem Relation Age of Onset  . Heart disease Father   . Heart disease Brother   . Heart attack Paternal Uncle     Social History:  reports that she quit smoking about 17 years ago. Her smoking use included Cigarettes. She has a 60 pack-year smoking history. She has never used smokeless tobacco. She reports that she drinks alcohol. She reports that she does not use illicit drugs.  Allergies:  Allergies  Allergen Reactions  . Ace Inhibitors Other (See Comments)    Cough  . Augmentin [Amoxicillin-Pot Clavulanate] Swelling    Swollen joints, pain  . Penicillins Swelling    Swollen joints  . Nifedipine Other (See Comments)    Unknown  . Propranolol Other (See Comments)     Unknown  . Diazepam Other (See Comments)    Made her "hyper"  . Meperidine Other (See Comments)    Made her "hyper"  . Propoxyphene Other  (See Comments)    Made her "hyper"    Medications: I have reviewed the patient's current medications.  Results for orders placed or performed during the hospital encounter of 11/10/15 (from the past 48 hour(s))  Anaerobic culture     Status: None (Preliminary result)   Collection Time: 11/13/15  3:00 PM  Result Value Ref Range   Specimen Description TISSUE    Special Requests      COSTAL CARTILAGE AND CHEST PT ON VANC ZINACEF AND CIPRO   Gram Stain      NO WBC SEEN  NO ORGANISMS SEEN Performed at [facility]    Culture PENDING    Report Status PENDING   Tissue culture     Status: None (Preliminary result)   Collection Time: 11/13/15  3:00 PM  Result Value Ref Range   Specimen Description TISSUE    Special Requests      COSTAL CARTILAGE AND CHEST PT ON VANC ZINACEF AND CIPRO   Gram Stain      NO WBC SEEN NO ORGANISMS SEEN Performed at [facility]    Culture PENDING    Report Status PENDING   Acid Fast Smear (AFB)     Status: None   Collection Time: 11/13/15  3:00 PM  Result Value Ref Range   AFB Specimen Processing Comment     Comment: Tissue Grinding and Digestion/Decontamination   Acid Fast Smear Negative     Comment: (NOTE) Performed At: [facility] [place], [place] [id number] Lindon Romp MD [id number]    Source (AFB) TISSUE     Comment: COSTAL CARTILAGE AND CHEST  CBC     Status: Abnormal   Collection Time: 11/14/15  3:17 AM  Result Value Ref Range   WBC 9.6 4.0 - 10.5 K/uL   RBC 3.68 (L) 3.87 - 5.11 MIL/uL   Hemoglobin 9.5 (L) 12.0 - 15.0 g/dL   HCT 32.3 (L) 36.0 - 46.0 %   MCV 87.8 78.0 - 100.0 fL   MCH 25.8 (L) 26.0 - 34.0 pg   MCHC 29.4 (L) 30.0 - 36.0 g/dL   RDW 20.1 (H) 11.5 - 15.5 %   Platelets 182 150 - 400 K/uL  Basic metabolic panel     Status: Abnormal   Collection Time: 11/14/15  3:17 AM  Result Value Ref Range   Sodium 139 135 - 145 mmol/L   Potassium 4.1 3.5 - 5.1 mmol/L   Chloride 97 (L)  101 - 111 mmol/L   CO2 32 22 - 32 mmol/L   Glucose, Bld 142 (H) 65 - 99 mg/dL   BUN 11 6 - 20 mg/dL   Creatinine, Ser 0.81 0.44 - 1.00 mg/dL   Calcium 9.0 8.9 - 10.3 mg/dL   GFR calc non Af Amer >60 >60 mL/min   GFR calc Af Amer >60 >60 mL/min    Comment: (NOTE) The eGFR has been calculated using the CKD EPI equation. This calculation has not been validated in all clinical situations. eGFR's persistently <60 mL/min signify possible Chronic Kidney Disease.    Anion gap 10 5 - 15  Blood gas, arterial     Status: Abnormal   Collection Time: 11/14/15  3:45 AM  Result Value Ref Range   O2 Content 2.0 L/min   Delivery systems NASAL CANNULA    pH, Arterial 7.420 7.350 - 7.450   pCO2 arterial 49.4 (H) 35.0 - 45.0 mmHg   pO2, Arterial 109 (H) 80.0 - 100.0 mmHg   Bicarbonate 31.5 (H) 20.0 - 24.0 mEq/L   TCO2 33.0 0 - 100 mmol/L   Acid-Base Excess 7.0 (H) 0.0 - 2.0 mmol/L   O2 Saturation 98.4 %   Patient temperature 98.6    Collection site RIGHT RADIAL    Drawn by [id number]    Sample type ARTERIAL DRAW    Allens test (pass/fail) PASS PASS  Prealbumin     Status: None   Collection Time: 11/14/15  9:25 PM  Result Value Ref Range   Prealbumin 19.0 18 - 38 mg/dL  CBC  Status: Abnormal   Collection Time: 11/15/15  4:45 AM  Result Value Ref Range   WBC 7.6 4.0 - 10.5 K/uL   RBC 3.50 (L) 3.87 - 5.11 MIL/uL   Hemoglobin 8.9 (L) 12.0 - 15.0 g/dL   HCT 30.6 (L) 36.0 - 46.0 %   MCV 87.4 78.0 - 100.0 fL   MCH 25.4 (L) 26.0 - 34.0 pg   MCHC 29.1 (L) 30.0 - 36.0 g/dL   RDW 20.2 (H) 11.5 - 15.5 %   Platelets 178 150 - 400 K/uL  Comprehensive metabolic panel     Status: Abnormal   Collection Time: 11/15/15  4:45 AM  Result Value Ref Range   Sodium 138 135 - 145 mmol/L   Potassium 4.5 3.5 - 5.1 mmol/L   Chloride 97 (L) 101 - 111 mmol/L   CO2 31 22 - 32 mmol/L   Glucose, Bld 123 (H) 65 - 99 mg/dL   BUN 14 6 - 20 mg/dL   Creatinine, Ser 1.10 (H) 0.44 - 1.00 mg/dL   Calcium 9.0 8.9 - 10.3  mg/dL   Total Protein 6.1 (L) 6.5 - 8.1 g/dL   Albumin 2.7 (L) 3.5 - 5.0 g/dL   AST 18 15 - 41 U/L   ALT 18 14 - 54 U/L   Alkaline Phosphatase 69 38 - 126 U/L   Total Bilirubin 0.8 0.3 - 1.2 mg/dL   GFR calc non Af Amer 47 (L) >60 mL/min   GFR calc Af Amer 54 (L) >60 mL/min    Comment: (NOTE) The eGFR has been calculated using the CKD EPI equation. This calculation has not been validated in all clinical situations. eGFR's persistently <60 mL/min signify possible Chronic Kidney Disease.    Anion gap 10 5 - 15    Dg Chest Port 1 View  11/14/2015  CLINICAL DATA:  Evaluate placement of right PICC EXAM: PORTABLE CHEST 1 VIEW COMPARISON:  11/13/2015 FINDINGS: Tip of the new right-sided PICC projects in the lower superior vena cava. Cardiac silhouette is enlarged. Changes from cardiac surgery and valve replacements is stable. No mediastinal or hilar masses or adenopathy. Lungs are essentially clear.  No pleural effusion.  No pneumothorax. IMPRESSION: 1. Right PICC is well positioned with its tip in the lower superior vena cava. 2. No other change from prior study. No acute cardiopulmonary disease. Electronically Signed   By: Lajean Manes M.D.   On: 11/14/2015 19:08   Dg Chest Port 1 View  11/13/2015  CLINICAL DATA:  Incisional drainage of right chest wall mass. EXAM: PORTABLE CHEST 1 VIEW COMPARISON:  CT chest 11/10/2015 and chest radiograph 05/22/2015. FINDINGS: Trachea is midline. Heart is enlarged. Pacemaker lead tips are stable in position, in the right atrium and right ventricle. Left atrial appendage clip is in place with evidence of mitral and tricuspid valve repair. Linear scarring or atelectasis in the midlung zones bilaterally. Calcified granuloma in the lateral right midlung zone. Lungs are otherwise clear. No pleural fluid. No pneumothorax. IMPRESSION: No acute findings. Electronically Signed   By: Lorin Picket M.D.   On: 11/13/2015 16:57    Review of Systems  Constitutional:  Negative.   HENT: Negative.   Eyes: Negative.   Respiratory: Negative.  Negative for shortness of breath.   Cardiovascular: Positive for leg swelling.  Gastrointestinal: Negative for heartburn and abdominal pain.  Genitourinary: Negative.   Musculoskeletal: Negative.   Skin: Negative.   Neurological: Negative for sensory change, speech change and focal weakness.  Psychiatric/Behavioral: Negative.  The patient does not have insomnia.    Blood pressure 114/40, pulse 74, temperature 98.2 F (36.8 C), temperature source Oral, resp. rate 18, height 5' 1.5" (1.562 m), weight 60.9 kg (134 lb 4.2 oz), SpO2 93 %. Physical Exam  Constitutional: She is oriented to person, place, and time. She appears well-developed and well-nourished.  HENT:  Head: Normocephalic and atraumatic.  Eyes: Conjunctivae and EOM are normal. Pupils are equal, round, and reactive to light.  Cardiovascular: Normal rate.   Respiratory: Effort normal. No respiratory distress. She has no wheezes.  GI: Soft. She exhibits no distension. There is no tenderness.  Musculoskeletal: She exhibits no edema.  Neurological: She is alert and oriented to person, place, and time.  Skin: Skin is warm.  Psychiatric: She has a normal mood and affect. Her behavior is normal. Judgment and thought content normal.    Assessment/Plan: We discussed treatment options which included local care with VAC and ACell.  This would have been a good option if the area had not been radiated.  At this point I think we need good healthy unradiated muscle.  This can be done with a VRAM or latissimus muscle flap.  We will work to arrange this for next week.  Wallace Going 11/15/2015, 9:50 AM

## 2015-11-18 NOTE — Anesthesia Postprocedure Evaluation (Signed)
Anesthesia Post Note  Patient: [name]  Procedure(s) Performed: Procedure(s) (LRB): VRAM Vertical Rectus Abdominus Muscle Flap (Right)  Patient location during evaluation: PACU Anesthesia Type: General Level of consciousness: awake and alert Pain management: pain level controlled Vital Signs Assessment: post-procedure vital signs reviewed and stable Respiratory status: spontaneous breathing, nonlabored ventilation, respiratory function stable and patient connected to nasal cannula oxygen Cardiovascular status: blood pressure returned to baseline and stable Postop Assessment: no signs of nausea or vomiting Anesthetic complications: no    Last Vitals:  Filed Vitals:   11/18/15 0400 11/18/15 1217  BP: 129/44 106/44  Pulse: 62 80  Temp: 36.6 C 36.4 C  Resp: 19 16    Last Pain:  Filed Vitals:   11/18/15 1241  PainSc: 0-No pain                 Ermon Sagan S

## 2015-11-19 LAB — BASIC METABOLIC PANEL
ANION GAP: 9 (ref 5–15)
BUN: 18 mg/dL (ref 6–20)
CHLORIDE: 93 mmol/L — AB (ref 101–111)
CO2: 26 mmol/L (ref 22–32)
Calcium: 8.6 mg/dL — ABNORMAL LOW (ref 8.9–10.3)
Creatinine, Ser: 1.1 mg/dL — ABNORMAL HIGH (ref 0.44–1.00)
GFR, EST AFRICAN AMERICAN: 54 mL/min — AB (ref >60)
GFR, EST NON AFRICAN AMERICAN: 47 mL/min — AB (ref >60)
Glucose, Bld: 146 mg/dL — ABNORMAL HIGH (ref 65–99)
POTASSIUM: 5.6 mmol/L — AB (ref 3.5–5.1)
SODIUM: 128 mmol/L — AB (ref 135–145)

## 2015-11-19 LAB — CBC
HCT: 27.2 % — ABNORMAL LOW (ref 36.0–46.0)
HEMOGLOBIN: 8.2 g/dL — AB (ref 12.0–15.0)
MCH: 26.2 pg (ref 26.0–34.0)
MCHC: 30.1 g/dL (ref 30.0–36.0)
MCV: 86.9 fL (ref 78.0–100.0)
PLATELETS: 222 10*3/uL (ref 150–400)
RBC: 3.13 MIL/uL — AB (ref 3.87–5.11)
RDW: 20.9 % — ABNORMAL HIGH (ref 11.5–15.5)
WBC: 13.3 10*3/uL — AB (ref 4.0–10.5)

## 2015-11-19 MED ORDER — NITROGLYCERIN 2 % TD OINT
0.5000 [in_us] | TOPICAL_OINTMENT | Freq: Four times a day (QID) | TRANSDERMAL | Status: DC
  Administered 2015-11-19 – 2015-11-21 (×7): 0.5 [in_us] via TOPICAL
  Filled 2015-11-19: qty 0.5

## 2015-11-19 NOTE — Progress Notes (Signed)
Patient sat up in the chair for several hours. She refused to walk. Stated she would tomorrow.

## 2015-11-19 NOTE — Progress Notes (Addendum)
      [address]       [place] [address]             [phone]      1 Day Post-Op Procedure(s) (LRB): VRAM Vertical Rectus Abdominus Muscle Flap (Right) Subjective: Feels sore, vertigo is improving  Objective: Vital signs in last 24 hours: Temp:  [97.3 F (36.3 C)-98.1 F (36.7 C)] 98.1 F (36.7 C) (03/12 0459) Pulse Rate:  [60-80] 60 (03/12 0459) Cardiac Rhythm:  [-] Normal sinus rhythm;Heart block (03/12 0744) Resp:  [10-21] 18 (03/12 0459) BP: (95-111)/(38-66) 95/66 mmHg (03/12 0459) SpO2:  [90 %-98 %] 98 % (03/12 0459)  Hemodynamic parameters for last 24 hours:    Intake/Output from previous day: 03/11 0701 - 03/12 0700 In: 1650 [I.V.:1200; IV Piggyback:250] Out: 1485 [Urine:975; Drains:310; Blood:200] Intake/Output this shift:    General appearance: alert, cooperative and no distress Heart: regular rate and rhythm Lungs: dim in lower fields Abdomen: benign Extremities: no edema Wound: dressings intact  Lab Results: No results for input(s): WBC, HGB, HCT, PLT in the last 72 hours. BMET:  Recent Labs  11/17/15 0420  NA 137  K 4.3  CL 97*  CO2 31  GLUCOSE 159*  BUN 12  CREATININE 0.79  CALCIUM 9.1    PT/INR: No results for input(s): LABPROT, INR in the last 72 hours. ABG    Component Value Date/Time   PHART 7.420 11/14/2015 0345   HCO3 31.5* 11/14/2015 0345   TCO2 33.0 11/14/2015 0345   ACIDBASEDEF 3.0* 04/26/2015 0832   O2SAT 98.4 11/14/2015 0345   CBG (last 3)  No results for input(s): GLUCAP in the last 72 hours.  Meds Scheduled Meds: . sodium chloride   Intravenous Once  . aspirin EC  81 mg Oral Daily  . bisacodyl  10 mg Oral Daily  . bisoprolol  10 mg Oral Daily  . ceFEPime (MAXIPIME) IV  2 g Intravenous Q12H  . docusate sodium  100 mg Oral BID  . enoxaparin (LOVENOX) injection  40 mg Subcutaneous Daily  . furosemide  20 mg Oral BID  . gabapentin  100 mg Oral BID  . levothyroxine  88 mcg Oral QAC breakfast    . losartan  100 mg Oral Daily  . pantoprazole  40 mg Oral BID  . potassium chloride SA  20 mEq Oral Daily  . senna-docusate  1 tablet Oral QHS  . simvastatin  20 mg Oral Daily  . sodium chloride flush  3 mL Intravenous Q12H  . spironolactone  25 mg Oral Daily  . vancomycin  1,000 mg Intravenous Q24H   Continuous Infusions:  PRN Meds:.sodium chloride, acetaminophen **OR** acetaminophen, benzonatate, fluticasone, ipratropium-albuterol, lactulose, magnesium hydroxide, morphine injection, ondansetron **OR** ondansetron (ZOFRAN) IV, ondansetron (ZOFRAN) IV, oxyCODONE, oxyCODONE, potassium chloride, sodium chloride flush, sodium chloride flush, sodium chloride flush, traMADol  Xrays No results found.  Assessment/Plan: S/P Procedure(s) (LRB): VRAM Vertical Rectus Abdominus Muscle Flap (Right)  1 stable on current management 2 vertigo improving 3 plans regarding flap management as per plastics 4 recheck labs  LOS: 9 days    GOLD,WAYNE E 11/19/2015  Discussed with plastics Stable vs I have seen and examined [name] and agree with the above assessment  and plan.  Grace Isaac MD Beeper [phone] Office [phone] 11/19/2015 12:10 PM

## 2015-11-19 NOTE — Progress Notes (Signed)
Pharmacy Antibiotic Note  [name] is a 81 y.o. female admitted on 11/10/2015 with wound infection. Pt on Cefepime (Day #5)and Vancomycin (Day #10). ID following.    3/9 Vancomycin trough 11 mcg/ml (therapeutic) on 1gm IV q24h  Goals:  Vancomycin trough 10-15  Plan:  Continue Vancomycin 1g IV Q24h  Continue cefepime 2g q12 hours F/U c/s, renal fxn, VT weekly, LOT  Temp (24hrs), Avg:97.7 F (36.5 C), Min:97.3 F (36.3 C), Max:98.1 F (36.7 C)   Recent Labs Lab 11/13/15 0709 11/14/15 0317 11/15/15 0445 11/16/15 0218 11/17/15 0420  WBC 9.3 9.6 7.6  --   --   CREATININE 0.79 0.81 1.10*  --  0.79  VANCOTROUGH  --   --   --  11  --     Estimated Creatinine Clearance: 48.9 mL/min (by C-G formula based on Cr of 0.79).    Allergies  Allergen Reactions  . Ace Inhibitors Cough    Cough  . Augmentin [Amoxicillin-Pot Clavulanate] Swelling    Swollen joints, pain  . Penicillins Swelling    Swollen joints  . Nifedipine Other (See Comments)    Unknown  . Propranolol Other (See Comments)     Unknown  . Diazepam Other (See Comments)    Made her "hyper"  . Meperidine Other (See Comments)    Made her "hyper"  . Propoxyphene Other (See Comments)    Made her "hyper"    Antimicrobials this admission: 3/2 vanc>> 3/3 Cipro (PTA)>>3/7 3/7 Cefepime>>  Micro: 3/3 blood x2>> neg 3/3 Sternal wound >> neg 3/3 MRSA PCR: neg 4/6 costal cartilage tissus>>ngtd  Erin Hearing PharmD., BCPS Clinical Pharmacist Pager [phone] 11/19/2015 12:38 PM

## 2015-11-19 NOTE — Progress Notes (Signed)
1 Day Post-Op  Subjective: The patient is sitting up in bed. No acute distress.  Flap is dusky but has capillary refill.  Objective: Vital signs in last 24 hours: Temp:  [97.3 F (36.3 C)-98.1 F (36.7 C)] 98.1 F (36.7 C) (03/12 0459) Pulse Rate:  [60-80] 60 (03/12 0459) Resp:  [10-21] 18 (03/12 0459) BP: (95-111)/(38-66) 95/66 mmHg (03/12 0459) SpO2:  [90 %-98 %] 98 % (03/12 0459) Last BM Date: 11/17/15  Intake/Output from previous day: 03/11 0701 - 03/12 0700 In: 1650 [I.V.:1200; IV Piggyback:250] Out: 1485 [Urine:975; Drains:310; Blood:200] Intake/Output this shift:    General appearance: alert, cooperative and no distress Skin: Skin color, texture, turgor normal. No rashes or lesions or slight dusky flap Incision/Wound: intact  Lab Results:  No results for input(s): WBC, HGB, HCT, PLT in the last 72 hours. BMET  Recent Labs  11/17/15 0420  NA 137  K 4.3  CL 97*  CO2 31  GLUCOSE 159*  BUN 12  CREATININE 0.79  CALCIUM 9.1   PT/INR No results for input(s): LABPROT, INR in the last 72 hours. ABG No results for input(s): PHART, HCO3 in the last 72 hours.  Invalid input(s): PCO2, PO2  Studies/Results: No results found.  Anti-infectives: Anti-infectives    Start     Dose/Rate Route Frequency Ordered Stop   11/18/15 1115  polymyxin B 500,000 Units, bacitracin 50,000 Units in sodium chloride irrigation 0.9 % 500 mL irrigation  Status:  Discontinued       As needed 11/18/15 1116 11/18/15 1216   11/18/15 0700  ciprofloxacin (CIPRO) IVPB 400 mg     400 mg 200 mL/hr over 60 Minutes Intravenous On call to O.R. 11/17/15 2052 11/18/15 0900   11/14/15 1500  ceFEPIme (MAXIPIME) 2 g in dextrose 5 % 50 mL IVPB     2 g 100 mL/hr over 30 Minutes Intravenous Every 12 hours 11/14/15 1448     11/13/15 0130  cefUROXime (ZINACEF) 1.5 g in dextrose 5 % 50 mL IVPB     1.5 g 100 mL/hr over 30 Minutes Intravenous To Short Stay 11/13/15 0632 11/13/15 1421   11/11/15 0230   vancomycin (VANCOCIN) IVPB 1000 mg/200 mL premix     1,000 mg 200 mL/hr over 60 Minutes Intravenous Every 24 hours 11/10/15 1339     11/10/15 1345  vancomycin (VANCOCIN) IVPB 1000 mg/200 mL premix     1,000 mg 200 mL/hr over 60 Minutes Intravenous  Once 11/10/15 1335 11/10/15 1510   11/10/15 1245  ciprofloxacin (CIPRO) tablet 500 mg  Status:  Discontinued     500 mg Oral 2 times daily 11/10/15 1244 11/14/15 1448      Assessment/Plan: s/p Procedure(s) with comments: VRAM Vertical Rectus Abdominus Muscle Flap (Right) - RIght Back add nitropaste on the flap to help with vasodilation.  LOS: 9 days    Wallace Going 11/19/2015

## 2015-11-20 ENCOUNTER — Encounter (HOSPITAL_COMMUNITY): Payer: Self-pay | Admitting: Plastic Surgery

## 2015-11-20 ENCOUNTER — Other Ambulatory Visit: Payer: Self-pay | Admitting: Plastic Surgery

## 2015-11-20 ENCOUNTER — Encounter: Payer: Self-pay | Admitting: *Deleted

## 2015-11-20 ENCOUNTER — Encounter: Payer: Self-pay | Admitting: Thoracic Surgery (Cardiothoracic Vascular Surgery)

## 2015-11-20 DIAGNOSIS — S21101A Unspecified open wound of right front wall of thorax without penetration into thoracic cavity, initial encounter: Secondary | ICD-10-CM

## 2015-11-20 LAB — BASIC METABOLIC PANEL
ANION GAP: 11 (ref 5–15)
BUN: 25 mg/dL — AB (ref 6–20)
CHLORIDE: 95 mmol/L — AB (ref 101–111)
CO2: 25 mmol/L (ref 22–32)
Calcium: 8.7 mg/dL — ABNORMAL LOW (ref 8.9–10.3)
Creatinine, Ser: 1.38 mg/dL — ABNORMAL HIGH (ref 0.44–1.00)
GFR, EST AFRICAN AMERICAN: 41 mL/min — AB (ref >60)
GFR, EST NON AFRICAN AMERICAN: 36 mL/min — AB (ref >60)
Glucose, Bld: 152 mg/dL — ABNORMAL HIGH (ref 65–99)
POTASSIUM: 5.1 mmol/L (ref 3.5–5.1)
SODIUM: 131 mmol/L — AB (ref 135–145)

## 2015-11-20 MED ORDER — CIPROFLOXACIN IN D5W 400 MG/200ML IV SOLN: 400.0000 mg | INTRAVENOUS | Status: DC

## 2015-11-20 MED ORDER — ENOXAPARIN SODIUM 30 MG/0.3ML ~~LOC~~ SOLN
30.0000 mg | Freq: Every day | SUBCUTANEOUS | Status: DC
  Administered 2015-11-20 – 2015-11-27 (×8): 30 mg via SUBCUTANEOUS
  Filled 2015-11-20 (×8): qty 0.3

## 2015-11-20 NOTE — Progress Notes (Addendum)
      [address]       [place] [address]             [phone]        2 Days Post-Op Procedure(s) (LRB): VRAM Vertical Rectus Abdominus Muscle Flap (Right)  Subjective: Patient has some pain on right side of chest  Objective: Vital signs in last 24 hours: Temp:  [98.4 F (36.9 C)-98.5 F (36.9 C)] 98.4 F (36.9 C) (03/13 0432) Pulse Rate:  [60-62] 61 (03/13 0432) Cardiac Rhythm:  [-] Atrial paced (03/13 0432) Resp:  [17-18] 18 (03/13 0432) BP: (93-102)/(32-40) 102/38 mmHg (03/13 0432) SpO2:  [94 %-98 %] 98 % (03/13 0432)   Current Weight  11/17/15 132 lb 14.4 oz (60.283 kg)      Intake/Output from previous day: 03/12 0701 - 03/13 0700 In: 480 [P.O.:480] Out: 660 [Urine:450; Drains:210]   Physical Exam:  Cardiovascular: RRR Pulmonary: Clear to auscultation bilaterally; no rales, wheezes, or rhonchi. Abdomen: Soft, non tender, bowel sounds present. Extremities: SCDs in place Wound: Clean and dry. Flap is dusky like  Lab Results: CBC: Recent Labs  11/19/15 1225  WBC 13.3*  HGB 8.2*  HCT 27.2*  PLT 222   BMET:  Recent Labs  11/19/15 1225 11/20/15 0450  NA 128* 131*  K 5.6* 5.1  CL 93* 95*  CO2 26 25  GLUCOSE 146* 152*  BUN 18 25*  CREATININE 1.10* 1.38*  CALCIUM 8.6* 8.7*    PT/INR:  Lab Results  Component Value Date   INR 1.29 11/13/2015   INR 1.40 11/12/2015   INR 1.74* 11/10/2015   ABG:  INR: Will add last result for INR, ABG once components are confirmed Will add last 4 CBG results once components are confirmed  Assessment/Plan:  1. CV - SR in the 60's. On Cozaar 100 mg daily and Bisoprolol 10 mg daily. 2.  Hyponatremia-sodium slightly increased to 131 3. Creatinine slightly increased to 1.38. On Lasix 20 mg bid but has been for awhile. Recheck in am 4.  Acute blood loss anemia - H and H yesterday 8.2 and 27.2 5.ID- On Vanco (day #14) and Cefepime (day #6) 6. JP drains with 210 cc sero sanguinous drainage  last 24 hours. Per Dr. Marla Roe 7. Pulmonary-On 2 liters of oxygen via [place]. Wean to room air as tolerates.  ZIMMERMAN,DONIELLE MPA-C 11/20/2015,7:56 AM  I have seen and examined the patient and agree with the assessment and plan as outlined.  Ultimately will plan to resume warfarin but hold off until wound felt to be stable per Dr Marla Roe.  Rexene Alberts, MD 11/20/2015 8:51 AM

## 2015-11-20 NOTE — Progress Notes (Signed)
Pt ambulated around hall about 50-26ft with front wheel walker, became unsteady, SOB and light headed. Safely transferred to chair and wheeled back to room.

## 2015-11-20 NOTE — Progress Notes (Signed)
2 Days Post-Op  Subjective: The patient is up to the chair and feeling better.  The flap is still dusky despite the nitropaste.  Will arrange for OR for debridement of the skin and placed of Acell and the VAC.  This is not unexpected as it was designed very distal from the blood supply in order to reach the defect.   Objective: Vital signs in last 24 hours: Temp:  [98.4 F (36.9 C)-98.5 F (36.9 C)] 98.4 F (36.9 C) (03/13 0432) Pulse Rate:  [60-62] 61 (03/13 0432) Resp:  [17-18] 18 (03/13 0432) BP: (93-102)/(32-40) 102/38 mmHg (03/13 0432) SpO2:  [94 %-98 %] 98 % (03/13 0432) Last BM Date: 11/17/15  Intake/Output from previous day: 03/12 0701 - 03/13 0700 In: 480 [P.O.:480] Out: 660 [Urine:450; Drains:210] Intake/Output this shift: Total I/O In: 360 [P.O.:360] Out: 350 [Urine:350]  General appearance: alert, cooperative and no distress Incision/Wound: intact  Lab Results:   Recent Labs  11/19/15 1225  WBC 13.3*  HGB 8.2*  HCT 27.2*  PLT 222   BMET  Recent Labs  11/19/15 1225 11/20/15 0450  NA 128* 131*  K 5.6* 5.1  CL 93* 95*  CO2 26 25  GLUCOSE 146* 152*  BUN 18 25*  CREATININE 1.10* 1.38*  CALCIUM 8.6* 8.7*   PT/INR No results for input(s): LABPROT, INR in the last 72 hours. ABG No results for input(s): PHART, HCO3 in the last 72 hours.  Invalid input(s): PCO2, PO2  Studies/Results: No results found.  Anti-infectives: Anti-infectives    Start     Dose/Rate Route Frequency Ordered Stop   11/18/15 1115  polymyxin B 500,000 Units, bacitracin 50,000 Units in sodium chloride irrigation 0.9 % 500 mL irrigation  Status:  Discontinued       As needed 11/18/15 1116 11/18/15 1216   11/18/15 0700  ciprofloxacin (CIPRO) IVPB 400 mg     400 mg 200 mL/hr over 60 Minutes Intravenous On call to O.R. 11/17/15 2052 11/18/15 0900   11/14/15 1500  ceFEPIme (MAXIPIME) 2 g in dextrose 5 % 50 mL IVPB     2 g 100 mL/hr over 30 Minutes Intravenous Every 12 hours  11/14/15 1448     11/13/15 0130  cefUROXime (ZINACEF) 1.5 g in dextrose 5 % 50 mL IVPB     1.5 g 100 mL/hr over 30 Minutes Intravenous To Short Stay 11/13/15 0632 11/13/15 1421   11/11/15 0230  vancomycin (VANCOCIN) IVPB 1000 mg/200 mL premix     1,000 mg 200 mL/hr over 60 Minutes Intravenous Every 24 hours 11/10/15 1339     11/10/15 1345  vancomycin (VANCOCIN) IVPB 1000 mg/200 mL premix     1,000 mg 200 mL/hr over 60 Minutes Intravenous  Once 11/10/15 1335 11/10/15 1510   11/10/15 1245  ciprofloxacin (CIPRO) tablet 500 mg  Status:  Discontinued     500 mg Oral 2 times daily 11/10/15 1244 11/14/15 1448      Assessment/Plan: s/p Procedure(s) with comments: VRAM Vertical Rectus Abdominus Muscle Flap (Right) - RIght Back OR for debridement and Acell/VAC placement this week.  LOS: 10 days    Wallace Going 11/20/2015

## 2015-11-20 NOTE — Progress Notes (Signed)
[facility]  Patient Status: New pt for [facility] this admission  [facility] is providing the following services:  HHRN and other skilled discipline as ordered; [facility] for home IV ABX upon DC to home. [facility] hospital team will follow pt to support transition home when ordered.  If patient discharges after hours, please call [phone].   Larry Sierras 11/20/2015, 1:18 PM

## 2015-11-20 NOTE — Progress Notes (Signed)
Patient ID: [name], female   DOB: June 10, 1935, 81 y.o.   MRN: [id number]         [facility] for Infectious Disease     Date of Admission:  11/10/2015   Total days of antibiotics 21        Day 7 vancomycin and cefepime  She is sitting up in a chair visiting with her husband. I will discuss the operative findings with Dr. Marla Roe and ask her opinion on duration of empiric IV antibiotic therapy given her recent muscle flap procedure.         Michel Bickers, MD [facility] for Infectious [place] [facility] [phone] pager   [phone] cell 09/12/2015, 1:32 PM

## 2015-11-21 ENCOUNTER — Encounter (HOSPITAL_COMMUNITY): Payer: Self-pay | Admitting: Anesthesiology

## 2015-11-21 ENCOUNTER — Inpatient Hospital Stay (HOSPITAL_COMMUNITY): Payer: Medicare Other | Admitting: Certified Registered Nurse Anesthetist

## 2015-11-21 ENCOUNTER — Encounter (HOSPITAL_COMMUNITY)
Admission: EM | Disposition: A | Discharge status: Home / Self Care | Payer: Self-pay | Source: Home / Self Care | Attending: Thoracic Surgery (Cardiothoracic Vascular Surgery)

## 2015-11-21 HISTORY — PX: APPLICATION OF A-CELL OF CHEST/ABDOMEN: SHX6302

## 2015-11-21 HISTORY — PX: APPLICATION OF WOUND VAC: SHX5189

## 2015-11-21 HISTORY — PX: INCISION AND DRAINAGE OF WOUND: SHX1803

## 2015-11-21 LAB — BASIC METABOLIC PANEL
ANION GAP: 10 (ref 5–15)
BUN: 25 mg/dL — ABNORMAL HIGH (ref 6–20)
CALCIUM: 8.9 mg/dL (ref 8.9–10.3)
CHLORIDE: 102 mmol/L (ref 101–111)
CO2: 25 mmol/L (ref 22–32)
CREATININE: 0.97 mg/dL (ref 0.44–1.00)
GFR calc non Af Amer: 55 mL/min — ABNORMAL LOW (ref >60)
Glucose, Bld: 143 mg/dL — ABNORMAL HIGH (ref 65–99)
Potassium: 4.1 mmol/L (ref 3.5–5.1)
SODIUM: 137 mmol/L (ref 135–145)

## 2015-11-21 LAB — CBC
HCT: 22.5 % — ABNORMAL LOW (ref 36.0–46.0)
HEMOGLOBIN: 7.1 g/dL — AB (ref 12.0–15.0)
MCH: 27.6 pg (ref 26.0–34.0)
MCHC: 31.6 g/dL (ref 30.0–36.0)
MCV: 87.5 fL (ref 78.0–100.0)
PLATELETS: 187 10*3/uL (ref 150–400)
RBC: 2.57 MIL/uL — AB (ref 3.87–5.11)
RDW: 20.6 % — ABNORMAL HIGH (ref 11.5–15.5)
WBC: 9.6 10*3/uL (ref 4.0–10.5)

## 2015-11-21 LAB — PREPARE RBC (CROSSMATCH)

## 2015-11-21 SURGERY — IRRIGATION AND DEBRIDEMENT WOUND
Anesthesia: General

## 2015-11-21 MED ORDER — SODIUM CHLORIDE 0.9 % IJ SOLN
INTRAMUSCULAR | Status: AC
  Filled 2015-11-21: qty 10

## 2015-11-21 MED ORDER — SODIUM CHLORIDE 0.9 % IR SOLN
Status: DC | PRN
  Administered 2015-11-21: 500 mL

## 2015-11-21 MED ORDER — FENTANYL CITRATE (PF) 250 MCG/5ML IJ SOLN
INTRAMUSCULAR | Status: AC
  Filled 2015-11-21: qty 5

## 2015-11-21 MED ORDER — PHENYLEPHRINE HCL 10 MG/ML IJ SOLN
10.0000 mg | INTRAVENOUS | Status: DC | PRN
  Administered 2015-11-21: 25 ug/min via INTRAVENOUS

## 2015-11-21 MED ORDER — SODIUM CHLORIDE 0.9 % IR SOLN
Status: DC | PRN
  Administered 2015-11-21: 1000 mL

## 2015-11-21 MED ORDER — PROPOFOL 10 MG/ML IV BOLUS
INTRAVENOUS | Status: AC
  Filled 2015-11-21: qty 20

## 2015-11-21 MED ORDER — ONDANSETRON HCL 4 MG/2ML IJ SOLN: 4.0000 mg | Freq: Once | INTRAMUSCULAR | Status: DC | PRN

## 2015-11-21 MED ORDER — LIDOCAINE HCL (CARDIAC) 20 MG/ML IV SOLN
INTRAVENOUS | Status: AC
  Filled 2015-11-21: qty 5

## 2015-11-21 MED ORDER — FENTANYL CITRATE (PF) 100 MCG/2ML IJ SOLN
INTRAMUSCULAR | Status: DC | PRN
  Administered 2015-11-21 (×2): 50 ug via INTRAVENOUS

## 2015-11-21 MED ORDER — ONDANSETRON HCL 4 MG/2ML IJ SOLN
INTRAMUSCULAR | Status: AC
  Filled 2015-11-21: qty 2

## 2015-11-21 MED ORDER — OXYCODONE HCL 5 MG/5ML PO SOLN: 5.0000 mg | Freq: Once | ORAL | Status: DC | PRN

## 2015-11-21 MED ORDER — SODIUM CHLORIDE 0.9 % IV SOLN: Freq: Once | INTRAVENOUS | Status: DC

## 2015-11-21 MED ORDER — FENTANYL CITRATE (PF) 100 MCG/2ML IJ SOLN: 25.0000 ug | INTRAMUSCULAR | Status: DC | PRN

## 2015-11-21 MED ORDER — ONDANSETRON HCL 4 MG/2ML IJ SOLN
INTRAMUSCULAR | Status: DC | PRN
  Administered 2015-11-21: 4 mg via INTRAVENOUS

## 2015-11-21 MED ORDER — EPHEDRINE SULFATE 50 MG/ML IJ SOLN
INTRAMUSCULAR | Status: AC
  Filled 2015-11-21: qty 1

## 2015-11-21 MED ORDER — LIDOCAINE HCL (CARDIAC) 20 MG/ML IV SOLN
INTRAVENOUS | Status: DC | PRN
  Administered 2015-11-21: 30 mg via INTRAVENOUS

## 2015-11-21 MED ORDER — OXYCODONE HCL 5 MG PO TABS: 5.0000 mg | ORAL_TABLET | Freq: Once | ORAL | Status: DC | PRN

## 2015-11-21 MED ORDER — ROCURONIUM BROMIDE 50 MG/5ML IV SOLN
INTRAVENOUS | Status: AC
  Filled 2015-11-21: qty 1

## 2015-11-21 MED ORDER — PROPOFOL 10 MG/ML IV BOLUS
INTRAVENOUS | Status: DC | PRN
  Administered 2015-11-21: 90 mg via INTRAVENOUS

## 2015-11-21 MED ORDER — PHENYLEPHRINE 40 MCG/ML (10ML) SYRINGE FOR IV PUSH (FOR BLOOD PRESSURE SUPPORT)
PREFILLED_SYRINGE | INTRAVENOUS | Status: AC
  Filled 2015-11-21: qty 10

## 2015-11-21 SURGICAL SUPPLY — 56 items
BAG DECANTER FOR FLEXI CONT (MISCELLANEOUS) IMPLANT
BENZOIN TINCTURE PRP APPL 2/3 (GAUZE/BANDAGES/DRESSINGS) ×3 IMPLANT
BLADE 10 SAFETY STRL DISP (BLADE) ×3 IMPLANT
CANISTER SUCTION 2500CC (MISCELLANEOUS) ×3 IMPLANT
CANISTER WOUND CARE 500ML ATS (WOUND CARE) ×3 IMPLANT
CONT SPEC STER OR (MISCELLANEOUS) IMPLANT
COVER SURGICAL LIGHT HANDLE (MISCELLANEOUS) ×3 IMPLANT
DRAPE IMP U-DRAPE 54X76 (DRAPES) ×3 IMPLANT
DRAPE INCISE IOBAN 66X45 STRL (DRAPES) IMPLANT
DRAPE LAPAROSCOPIC ABDOMINAL (DRAPES) IMPLANT
DRAPE PED LAPAROTOMY (DRAPES) ×3 IMPLANT
DRAPE PROXIMA HALF (DRAPES) IMPLANT
DRESSING HYDROCOLLOID 4X4 (GAUZE/BANDAGES/DRESSINGS) ×3 IMPLANT
DRSG ADAPTIC 3X8 NADH LF (GAUZE/BANDAGES/DRESSINGS) IMPLANT
DRSG CUTIMED SORBACT 7X9 (GAUZE/BANDAGES/DRESSINGS) ×3 IMPLANT
DRSG EMULSION OIL 3X3 NADH (GAUZE/BANDAGES/DRESSINGS) IMPLANT
DRSG PAD ABDOMINAL 8X10 ST (GAUZE/BANDAGES/DRESSINGS) IMPLANT
DRSG VAC ATS LRG SENSATRAC (GAUZE/BANDAGES/DRESSINGS) IMPLANT
DRSG VAC ATS MED SENSATRAC (GAUZE/BANDAGES/DRESSINGS) ×3 IMPLANT
DRSG VAC ATS SM SENSATRAC (GAUZE/BANDAGES/DRESSINGS) IMPLANT
ELECT CAUTERY BLADE 6.4 (BLADE) ×3 IMPLANT
ELECT REM PT RETURN 9FT ADLT (ELECTROSURGICAL) ×3
ELECTRODE REM PT RTRN 9FT ADLT (ELECTROSURGICAL) ×1 IMPLANT
GAUZE SPONGE 4X4 12PLY STRL (GAUZE/BANDAGES/DRESSINGS) IMPLANT
GAUZE SPONGE 4X4 16PLY XRAY LF (GAUZE/BANDAGES/DRESSINGS) ×3 IMPLANT
GLOVE BIO SURGEON STRL SZ 6.5 (GLOVE) ×2 IMPLANT
GLOVE BIO SURGEONS STRL SZ 6.5 (GLOVE) ×1
GOWN STRL REUS W/ TWL LRG LVL3 (GOWN DISPOSABLE) ×3 IMPLANT
GOWN STRL REUS W/TWL LRG LVL3 (GOWN DISPOSABLE) ×6
KIT BASIN OR (CUSTOM PROCEDURE TRAY) ×3 IMPLANT
KIT ROOM TURNOVER OR (KITS) ×3 IMPLANT
MATRIX SURGICAL PSM 7X10CM (Tissue) ×3 IMPLANT
MICROMATRIX 1000MG (Tissue) ×3 IMPLANT
MICROMATRIX 500MG (Tissue) ×3 IMPLANT
NS IRRIG 1000ML POUR BTL (IV SOLUTION) ×3 IMPLANT
PACK GENERAL/GYN (CUSTOM PROCEDURE TRAY) ×3 IMPLANT
PACK SURGICAL SETUP 50X90 (CUSTOM PROCEDURE TRAY) ×3 IMPLANT
PACK UNIVERSAL I (CUSTOM PROCEDURE TRAY) ×3 IMPLANT
PAD ARMBOARD 7.5X6 YLW CONV (MISCELLANEOUS) ×6 IMPLANT
PENCIL BUTTON HOLSTER BLD 10FT (ELECTRODE) ×3 IMPLANT
SOLUTION PARTIC MCRMTRX 1000MG (Tissue) ×1 IMPLANT
SOLUTION PARTIC MCRMTRX 500MG (Tissue) ×1 IMPLANT
STAPLER VISISTAT 35W (STAPLE) ×3 IMPLANT
SURGILUBE 2OZ TUBE FLIPTOP (MISCELLANEOUS) IMPLANT
SUT CHROMIC 4 0 P 3 18 (SUTURE) IMPLANT
SUT ETHILON 4 0 PS 2 18 (SUTURE) IMPLANT
SUT ETHILON 5 0 P 3 18 (SUTURE)
SUT NYLON ETHILON 5-0 P-3 1X18 (SUTURE) IMPLANT
SUT SILK 4 0 PS 2 (SUTURE) ×3 IMPLANT
SUT VIC AB 5-0 PS2 18 (SUTURE) ×9 IMPLANT
SWAB COLLECTION DEVICE MRSA (MISCELLANEOUS) IMPLANT
SYR BULB 3OZ (MISCELLANEOUS) IMPLANT
TOWEL OR 17X24 6PK STRL BLUE (TOWEL DISPOSABLE) ×3 IMPLANT
TOWEL OR 17X26 10 PK STRL BLUE (TOWEL DISPOSABLE) ×3 IMPLANT
TUBE ANAEROBIC SPECIMEN COL (MISCELLANEOUS) IMPLANT
UNDERPAD 30X30 INCONTINENT (UNDERPADS AND DIAPERS) ×3 IMPLANT

## 2015-11-21 NOTE — Op Note (Signed)
Operative Note   DATE OF OPERATION: 11/21/2015  LOCATION:  [facility] Inpatient  SURGICAL DIVISION: Plastic Surgery  PREOPERATIVE DIAGNOSES:  Chest Wall Defect  6 x 10 cm  POSTOPERATIVE DIAGNOSES:  same  PROCEDURE:  Debridement of latissimus flap (skin and fat) to chest wall defect with placement of Acell (7 x 10 cm sheet and 1.5 gm powder) and VAC placement  SURGEON: Lorry Furber Sanger Rece Zechman, DO  ASSISTANT: Shawn Rayburn, PA  ANESTHESIA:  General.   COMPLICATIONS: None.   INDICATIONS FOR PROCEDURE:  The patient, [name] is a 81 y.o. female born on 06/12/1935, is here for treatment of a chest wall defect.  She underwent a valve replacement and CABG.  She had wound breakdown at the sternal incision site that healed and then had inferior and right sided chest / cartilage breakdown.  This is likely due to the radiation she received for treatment of her right breast cancer. She underwent a latissimus muscle flap and had superficial loss. MRN: [id number]  CONSENT:  Informed consent was obtained directly from the patient. Risks, benefits and alternatives were fully discussed. Specific risks including but not limited to bleeding, infection, hematoma, seroma, scarring, pain, infection, contracture, asymmetry, wound healing problems, and need for further surgery were all discussed. The patient did have an ample opportunity to have questions answered to satisfaction.   DESCRIPTION OF PROCEDURE:  The patient was taken to the operating room. SCDs were placed and IV antibiotics were given. The patient's operative site was prepped and draped in a sterile fashion. A time out was performed and all information was confirmed to be correct.  General anesthesia was administered.  The area was evaluated for blood supply and flow.  The skin was nonviable and excised to the fat layer 6 x 10 cm.  There was blood flow at the proximal area that was better than distal area.  The fat on the distal flap was  further excised.  The area was irrigated with antibiotic solution.  The Acell powder (1.5 gm) and sheet (7 x 10 cm) was applied and secured with 5-0  Vicryl.  The sorbac was applied over it with KY gel and the VAC.  There was an excellent seal.  The patient tolerated the procedure well.  There were no complications. The patient was allowed to wake from anesthesia, extubated and taken to the recovery room in satisfactory condition.

## 2015-11-21 NOTE — Care Management Note (Addendum)
Case Management Note Previous CM note initiated by Loyola Mast RN, CM  Patient Details  Name: [name] MRN: [id number] Date of Birth: 1935-02-27  Subjective/Objective:  81 y.o. F s/p MVR with Bioprosthetic Valve, CABG x 1, Maze Procedure Tricuspid Valve Replacement. Will be receiving HHRN and IV abx via [place], per pt, at discharge. pt is expected to remain in hospital until after Flap surgery next week as there is concern for infection per Infectious Disease. Pt has Wound Vac. ID to follow up on 11/20/2015.                   Action/Plan: Pam with [facility] is following for infusion therapy. Notified Manuela Schwartz of  Anticipated [place] need.   Expected Discharge Date:                  Expected Discharge Plan:  [place]  In-House Referral:     Discharge planning Services  CM Consult  Post Acute Care Choice:  Home Health, Durable Medical Equipment Choice offered to:  Patient  DME Arranged:    DME Agency:     HH Arranged:  RN [place] Agency:  [place]  Status of Service:  In process, will continue to follow  Medicare Important Message Given:  Yes Date Medicare IM Given:    Medicare IM give by:    Date Additional Medicare IM Given:    Additional Medicare Important Message give by:     If discussed at [place] of Stay Meetings, dates discussed: 11/16/2015 , 11/21/15  Additional Comments:  11/21/15- 1530- [name] RN, BSN- pt returned today to OR for further [place] ,APPLICATION OF WOUND VAC and  APPLICATION OF A-CELL OF CHEST/ABDOMEN-- CM following for d/c needs- [facility] referral has been made for potential IV abx at discharge- ?need for wound VAC at discharge- MD please advise if pt will go home with wound VAC so process can be started for approval on home wound VAC.    [name], RN 11/21/2015, 3:32 PM

## 2015-11-21 NOTE — Anesthesia Procedure Notes (Signed)
Procedure Name: LMA Insertion Date/Time: 11/21/2015 7:49 AM Performed by: Greggory Stallion, Haniel Fix L Pre-anesthesia Checklist: Patient identified, Emergency Drugs available, Suction available, Patient being monitored and Timeout performed Patient Re-evaluated:Patient Re-evaluated prior to inductionOxygen Delivery Method: Circle system utilized Preoxygenation: Pre-oxygenation with 100% oxygen Intubation Type: IV induction Ventilation: Mask ventilation without difficulty LMA: LMA inserted LMA Size: 4.0 Number of attempts: 1 Placement Confirmation: positive ETCO2 and breath sounds checked- equal and bilateral Tube secured with: Tape Dental Injury: Teeth and Oropharynx as per pre-operative assessment

## 2015-11-21 NOTE — Brief Op Note (Signed)
11/10/2015 - 11/21/2015  7:36 AM  PATIENT:  [name]  81 y.o. female  PRE-OPERATIVE DIAGNOSIS:  chest wall defect  POST-OPERATIVE DIAGNOSIS:  same  PROCEDURE:  Procedure(s): IRRIGATION AND DEBRIDEMENT WOUND (N/A) APPLICATION OF WOUND VAC (N/A) APPLICATION OF A-CELL OF CHEST/ABDOMEN (N/A)  SURGEON:  Surgeon(s) and Role:    * Loel Lofty Edita Weyenberg, DO - Primary  PHYSICIAN ASSISTANT: Shawn Rayburn, PA  ASSISTANTS: none   ANESTHESIA:   general  EBL:     BLOOD ADMINISTERED:none  DRAINS: none   LOCAL MEDICATIONS USED:  NONE  SPECIMEN:  No Specimen  DISPOSITION OF SPECIMEN:  N/A  COUNTS:  YES  TOURNIQUET:  * No tourniquets in log *  DICTATION: .Dragon Dictation  PLAN OF CARE: Admit to inpatient   PATIENT DISPOSITION:  PACU - hemodynamically stable.   Delay start of Pharmacological VTE agent (>24hrs) due to surgical blood loss or risk of bleeding: no

## 2015-11-21 NOTE — Transfer of Care (Signed)
Immediate Anesthesia Transfer of Care Note  Patient: [name]  Procedure(s) Performed: Procedure(s): IRRIGATION AND DEBRIDEMENT WOUND (N/A) APPLICATION OF WOUND VAC (N/A) APPLICATION OF A-CELL OF CHEST/ABDOMEN (N/A)  Patient Location: PACU  Anesthesia Type:General  Level of Consciousness: awake, alert , oriented and patient cooperative  Airway & Oxygen Therapy: Patient Spontanous Breathing and Patient connected to nasal cannula oxygen  Post-op Assessment: Report given to RN, Post -op Vital signs reviewed and stable and Patient moving all extremities  Post vital signs: Reviewed and stable  Last Vitals:  Filed Vitals:   11/21/15 0833 11/21/15 0845  BP:  98/38  Pulse:  62  Temp: 36.6 C 36.6 C  Resp:  13    Complications: No apparent anesthesia complications

## 2015-11-21 NOTE — Progress Notes (Signed)
Patient ID: [name], female   DOB: Jul 06, 1935, 81 y.o.   MRN: [id number]         [facility] for Infectious Disease    Date of Admission:  11/10/2015   Total days of antibiotics 22       Day 7 vancomycin and cefepime  All recent sternal wound cultures have been negative. I suspect that prior radiation therapy may have affected wound healing. I spoke to Dr. Marla Roe and she feels one week of antibiotic therapy after her recent flap procedure should suffice. I agree with that recommendation and will continue vancomycin and cefepime for 4 more days through 11/25/2015. I will sign off now but please call if I can be of further assistance while she is here.         Michel Bickers, MD [facility] for Infectious [place] [facility] [phone] pager   [phone] cell 09/12/2015, 1:32 PM

## 2015-11-21 NOTE — Interval H&P Note (Signed)
History and Physical Interval Note:  11/21/2015 6:59 AM  [name]  has presented today for surgery, with the diagnosis of chest wall defect  The various methods of treatment have been discussed with the patient and family. After consideration of risks, benefits and other options for treatment, the patient has consented to  Procedure(s): IRRIGATION AND DEBRIDEMENT WOUND (N/A) APPLICATION OF WOUND VAC (N/A) APPLICATION OF A-CELL OF CHEST/ABDOMEN (N/A) as a surgical intervention .  The patient's history has been reviewed, patient examined, no change in status, stable for surgery.  I have reviewed the patient's chart and labs.  Questions were answered to the patient's satisfaction.     [name]

## 2015-11-21 NOTE — Care Management Important Message (Signed)
Important Message  Patient Details  Name: [name] MRN: [id number] Date of Birth: [birth year]   Medicare Important Message Given:  Yes    Nathen May 11/21/2015, 11:32 AM

## 2015-11-21 NOTE — Progress Notes (Signed)
Utilization review completed.  

## 2015-11-21 NOTE — Progress Notes (Signed)
      [address]       [place] [address]             [phone]     CARDIOTHORACIC SURGERY PROGRESS NOTE  Day of Surgery  S/P Procedure(s) (LRB): IRRIGATION AND DEBRIDEMENT WOUND (N/A) APPLICATION OF WOUND VAC (N/A) APPLICATION OF A-CELL OF CHEST/ABDOMEN (N/A)  Subjective: Feels sore in chest.  Otherwise okay.  Objective: Vital signs in last 24 hours: Temp:  [97.9 F (36.6 C)-98.7 F (37.1 C)] 98.3 F (36.8 C) (03/14 1317) Pulse Rate:  [62-72] 62 (03/14 1317) Cardiac Rhythm:  [-] A-V Sequential paced (03/14 0915) Resp:  [13-18] 17 (03/14 1317) BP: (95-108)/(35-49) 95/35 mmHg (03/14 1317) SpO2:  [91 %-100 %] 91 % (03/14 1317)  Physical Exam:  Rhythm:   paced  Breath sounds: clear  Heart sounds:  RRR  Incisions:  Wound VAC intact  Abdomen:  Soft, non-distended, non-tender  Extremities:  Warm, well-perfused    Intake/Output from previous day: 03/13 0701 - 03/14 0700 In: 610 [P.O.:600; I.V.:10] Out: 1370 [Urine:1200; Drains:170] Intake/Output this shift: Total I/O In: 315 [P.O.:240; I.V.:75] Out: 25 [Blood:25]  Lab Results:  Recent Labs  11/19/15 1225 11/21/15 0500  WBC 13.3* 9.6  HGB 8.2* 7.1*  HCT 27.2* 22.5*  PLT 222 187   BMET:  Recent Labs  11/20/15 0450 11/21/15 0500  NA 131* 137  K 5.1 4.1  CL 95* 102  CO2 25 25  GLUCOSE 152* 143*  BUN 25* 25*  CREATININE 1.38* 0.97  CALCIUM 8.7* 8.9    CBG (last 3)  No results for input(s): GLUCAP in the last 72 hours. PT/INR:  No results for input(s): LABPROT, INR in the last 72 hours.  CXR:  N/A  Assessment/Plan: S/P Procedure(s) (LRB): IRRIGATION AND DEBRIDEMENT WOUND (N/A) APPLICATION OF WOUND VAC (N/A) APPLICATION OF A-CELL OF CHEST/ABDOMEN (N/A)  Clinically stable although Hgb down to 7.1 this morning prior to surgery, and reportedly patient has not received any blood today.  Will transfuse for acute blood loss anemia.  Discussed with patient and her husband.  Dr  Hale Bogus input appreciated - no clear signs of infection but rather chronic non-healing wound felt likely related to previous chest radiation therapy.    I spent in excess of 15 minutes during the conduct of this hospital encounter and >50% of this time involved direct face-to-face encounter with the patient for counseling and/or coordination of their care.   Rexene Alberts, MD 11/21/2015 2:24 PM

## 2015-11-22 ENCOUNTER — Encounter (HOSPITAL_COMMUNITY): Payer: Self-pay | Admitting: Plastic Surgery

## 2015-11-22 LAB — BASIC METABOLIC PANEL
ANION GAP: 9 (ref 5–15)
BUN: 24 mg/dL — ABNORMAL HIGH (ref 6–20)
CHLORIDE: 103 mmol/L (ref 101–111)
CO2: 25 mmol/L (ref 22–32)
Calcium: 8.7 mg/dL — ABNORMAL LOW (ref 8.9–10.3)
Creatinine, Ser: 0.89 mg/dL (ref 0.44–1.00)
GFR calc Af Amer: >60 mL/min (ref >60)
GLUCOSE: 126 mg/dL — AB (ref 65–99)
POTASSIUM: 4.4 mmol/L (ref 3.5–5.1)
Sodium: 137 mmol/L (ref 135–145)

## 2015-11-22 LAB — CBC
HEMATOCRIT: 31 % — AB (ref 36.0–46.0)
HEMOGLOBIN: 9.7 g/dL — AB (ref 12.0–15.0)
MCH: 27.6 pg (ref 26.0–34.0)
MCHC: 31.3 g/dL (ref 30.0–36.0)
MCV: 88.1 fL (ref 78.0–100.0)
Platelets: 217 10*3/uL (ref 150–400)
RBC: 3.52 MIL/uL — AB (ref 3.87–5.11)
RDW: 18.5 % — ABNORMAL HIGH (ref 11.5–15.5)
WBC: 8.3 10*3/uL (ref 4.0–10.5)

## 2015-11-22 LAB — TYPE AND SCREEN
ABO/RH(D): O POS
Antibody Screen: NEGATIVE
UNIT DIVISION: 0
Unit division: 0

## 2015-11-22 LAB — VANCOMYCIN, TROUGH: VANCOMYCIN TR: 16 ug/mL (ref 10.0–20.0)

## 2015-11-22 NOTE — Progress Notes (Addendum)
1 Day Post-Op Procedure(s) (LRB): IRRIGATION AND DEBRIDEMENT WOUND (N/A) APPLICATION OF WOUND VAC (N/A) APPLICATION OF A-CELL OF CHEST/ABDOMEN (N/A) Subjective: Soreness is improving   Objective: Vital signs in last 24 hours: Temp:  [97.3 F (36.3 C)-98.3 F (36.8 C)] 97.3 F (36.3 C) (03/15 0502) Pulse Rate:  [60-66] 61 (03/15 0502) Cardiac Rhythm:  [-] Atrial paced;Bundle branch block;Heart block (03/15 0700) Resp:  [17-20] 18 (03/15 0502) BP: (95-128)/(35-78) 128/50 mmHg (03/15 0502) SpO2:  [91 %-100 %] 99 % (03/15 0502) Weight:  [139 lb (63.05 kg)] 139 lb (63.05 kg) (03/15 0203)  Hemodynamic parameters for last 24 hours:    Intake/Output from previous day: 03/14 0701 - 03/15 0700 In: 1190.9 [P.O.:480; I.V.:85; Blood:570.9] Out: 1615 [Urine:1500; Drains:90; Blood:25] Intake/Output this shift:    General appearance: alert, cooperative and no distress Heart: regular rate and rhythm Lungs: clear to auscultation bilaterally Wound: Vac in place  Lab Results:  Recent Labs  11/21/15 0500 11/22/15 0330  WBC 9.6 8.3  HGB 7.1* 9.7*  HCT 22.5* 31.0*  PLT 187 217   BMET:  Recent Labs  11/21/15 0500 11/22/15 0330  NA 137 137  K 4.1 4.4  CL 102 103  CO2 25 25  GLUCOSE 143* 126*  BUN 25* 24*  CREATININE 0.97 0.89  CALCIUM 8.9 8.7*    PT/INR: No results for input(s): LABPROT, INR in the last 72 hours. ABG    Component Value Date/Time   PHART 7.420 11/14/2015 0345   HCO3 31.5* 11/14/2015 0345   TCO2 33.0 11/14/2015 0345   ACIDBASEDEF 3.0* 04/26/2015 0832   O2SAT 98.4 11/14/2015 0345   CBG (last 3)  No results for input(s): GLUCAP in the last 72 hours.  Meds Scheduled Meds: . sodium chloride   Intravenous Once  . aspirin EC  81 mg Oral Daily  . bisacodyl  10 mg Oral Daily  . bisoprolol  10 mg Oral Daily  . ceFEPime (MAXIPIME) IV  2 g Intravenous Q12H  . docusate sodium  100 mg Oral BID  . enoxaparin (LOVENOX) injection  30 mg Subcutaneous Daily  .  furosemide  20 mg Oral BID  . gabapentin  100 mg Oral BID  . levothyroxine  88 mcg Oral QAC breakfast  . losartan  100 mg Oral Daily  . pantoprazole  40 mg Oral BID  . senna-docusate  1 tablet Oral QHS  . simvastatin  20 mg Oral Daily  . sodium chloride flush  3 mL Intravenous Q12H  . vancomycin  1,000 mg Intravenous Q24H   Continuous Infusions:  PRN Meds:.sodium chloride, acetaminophen **OR** acetaminophen, benzonatate, fluticasone, ipratropium-albuterol, lactulose, magnesium hydroxide, morphine injection, ondansetron **OR** ondansetron (ZOFRAN) IV, ondansetron (ZOFRAN) IV, oxyCODONE, oxyCODONE, sodium chloride flush, sodium chloride flush, traMADol  Xrays No results found.  Assessment/Plan: S/P Procedure(s) (LRB): IRRIGATION AND DEBRIDEMENT WOUND (N/A) APPLICATION OF WOUND VAC (N/A) APPLICATION OF A-CELL OF CHEST/ABDOMEN (N/A)  1 she is feeling better 2 cont with management as per plastics, Vac in place 3 H/H improved after transfusion 4 no leukocytosis or fevers  LOS: 12 days    GOLD,WAYNE E 11/22/2015  I have seen and examined the patient and agree with the assessment and plan as outlined.  Rexene Alberts, MD 11/22/2015 9:53 AM

## 2015-11-22 NOTE — Progress Notes (Signed)
Pharmacy Antibiotic Note  [name] is a 81 y.o. female admitted on 11/10/2015 with sternal wound infection.  Pharmacy has been consulted for Vancomycin dosing.  Plan: -Continue vancomycin 1000 mg IV q24h -Re-check trough with any changes in clinical status  Height: 5' 1.5" (156.2 cm) Weight: 139 lb (63.05 kg) IBW/kg (Calculated) : 48.95  Temp (24hrs), Avg:98.1 F (36.7 C), Min:97.8 F (36.6 C), Max:98.7 F (37.1 C)   Recent Labs Lab 11/15/15 0445 11/16/15 0218 11/17/15 0420 11/19/15 1225 11/20/15 0450 11/21/15 0500 11/22/15 0150  WBC 7.6  --   --  13.3*  --  9.6  --   CREATININE 1.10*  --  0.79 1.10* 1.38* 0.97  --   VANCOTROUGH  --  11  --   --   --   --  16    Estimated Creatinine Clearance: 41.2 mL/min (by C-G formula based on Cr of 0.97).    Allergies  Allergen Reactions  . Ace Inhibitors Cough    Cough  . Augmentin [Amoxicillin-Pot Clavulanate] Swelling    Swollen joints, pain  . Penicillins Swelling    Swollen joints  . Nifedipine Other (See Comments)    Unknown  . Propranolol Other (See Comments)     Unknown  . Diazepam Other (See Comments)    Made her "hyper"  . Meperidine Other (See Comments)    Made her "hyper"  . Propoxyphene Other (See Comments)    Made her "hyper"     Narda Bonds 11/22/2015 2:32 AM

## 2015-11-23 MED ORDER — WARFARIN SODIUM 2.5 MG PO TABS: 1.2500 mg | ORAL_TABLET | ORAL | Status: DC

## 2015-11-23 MED ORDER — WARFARIN - PHYSICIAN DOSING INPATIENT: Freq: Every day | Status: DC

## 2015-11-23 MED ORDER — WARFARIN SODIUM 2.5 MG PO TABS
2.5000 mg | ORAL_TABLET | ORAL | Status: DC
  Administered 2015-11-24 – 2015-11-26 (×3): 2.5 mg via ORAL
  Filled 2015-11-23 (×3): qty 1

## 2015-11-23 MED ORDER — WARFARIN SODIUM 2.5 MG PO TABS: 2.5000 mg | ORAL_TABLET | ORAL | Status: DC

## 2015-11-23 MED ORDER — BISACODYL 10 MG RE SUPP
10.0000 mg | Freq: Once | RECTAL | Status: AC
  Administered 2015-11-23: 10 mg via RECTAL
  Filled 2015-11-23: qty 1

## 2015-11-23 NOTE — Care Management Note (Signed)
Case Management Note Previous CM note initiated by Loyola Mast RN, CM  Patient Details  Name: [name] MRN: [id number] Date of Birth: 03-Nov-1934  Subjective/Objective:  81 y.o. F s/p MVR with Bioprosthetic Valve, CABG x 1, [place] Procedure Tricuspid Valve Replacement. Will be receiving HHRN and IV abx via [place], per pt, at discharge. pt is expected to remain in hospital until after Flap surgery next week as there is concern for infection per Infectious Disease. Pt has Wound Vac. ID to follow up on 11/20/2015.                   Action/Plan: Pam with [facility] is following for infusion therapy. Notified Manuela Schwartz of  Anticipated [place] need.   Expected Discharge Date:                  Expected Discharge Plan:  [place]  In-House Referral:     Discharge planning Services  CM Consult  Post Acute Care Choice:  Home Health, Durable Medical Equipment Choice offered to:  Patient  DME Arranged:    DME Agency:     HH Arranged:  RN [place] Agency:  [place]  Status of Service:  In process, will continue to follow  Medicare Important Message Given:  Yes Date Medicare IM Given:    Medicare IM give by:    Date Additional Medicare IM Given:    Additional Medicare Important Message give by:     If discussed at [place] of Stay Meetings, dates discussed: 11/16/2015 , 11/21/15, 11/23/15  Additional Comments:  11/23/15- 1500- [name] RN, BSN - following pt for d/c needs- referral received for home wound VAC- [employer] form placed on shadow chart for signature- spoke with DR. Dillingham- who states pt will go home on traditional wound VAC not Prevana- [employer] form has been signed- also per Dr. Marla Roe she states that per her stand point pt could go home on po abx- but this would need to be cleared by ID- vs finishing IV course through 3/18- MD please advise if pt can go home on po abx vs needing to finish IV abx- if IV abx needed it would make since to finish here then d/c home  after last dose on 3/18 with [facility]- - will need orders for HHRN/PT/OT- per Dr. Marla Roe will need wound VAC change next week then the following week drsg change twice. Call made to [place] with [employer] regarding need for home wound VAC and order form faxed.   11/21/15- 1530- [name] RN, BSN- pt returned today to OR for further [place] ,APPLICATION OF WOUND VAC and  APPLICATION OF A-CELL OF CHEST/ABDOMEN-- CM following for d/c needs- [facility] referral has been made for potential IV abx at discharge- ?need for wound VAC at discharge- MD please advise if pt will go home with wound VAC so process can be started for approval on home wound VAC.    [name], RN 11/23/2015, 3:20 PM

## 2015-11-23 NOTE — Progress Notes (Addendum)
      [address]       [place] [address]             [phone]        1 Days Post-Op Procedure(s) (LRB): IRRIGATION AND DEBRIDEMENT WOUND (N/A) APPLICATION OF WOUND VAC (N/A) APPLICATION OF A-CELL OF CHEST/ABDOMEN (N/A)  Subjective: Patient has some pain on right side (lattismus flap)  Objective: Vital signs in last 24 hours: Temp:  [97.6 F (36.4 C)-98 F (36.7 C)] 97.6 F (36.4 C) (03/16 0518) Pulse Rate:  [62-71] 62 (03/16 0518) Cardiac Rhythm:  [-] Atrial paced (03/15 2057) Resp:  [18-19] 18 (03/16 0518) BP: (105-121)/(46-49) 117/49 mmHg (03/16 0518) SpO2:  [94 %-99 %] 99 % (03/16 0518)   Current Weight  11/22/15 139 lb (63.05 kg)      Intake/Output from previous day: 03/15 0701 - 03/16 0700 In: 783 [P.O.:720; I.V.:3] Out: 720 [Urine:700; Drains:20]   Physical Exam:  Cardiovascular: Paced Pulmonary: Clear to auscultation bilaterally; no rales, wheezes, or rhonchi. Abdomen: Soft, non tender, bowel sounds present. Extremities: SCDs in place Wound: VAC and Acell in place  Lab Results: CBC:  Recent Labs  11/21/15 0500 11/22/15 0330  WBC 9.6 8.3  HGB 7.1* 9.7*  HCT 22.5* 31.0*  PLT 187 217   BMET:   Recent Labs  11/21/15 0500 11/22/15 0330  NA 137 137  K 4.1 4.4  CL 102 103  CO2 25 25  GLUCOSE 143* 126*  BUN 25* 24*  CREATININE 0.97 0.89  CALCIUM 8.9 8.7*    PT/INR:  Lab Results  Component Value Date   INR 1.29 11/13/2015   INR 1.40 11/12/2015   INR 1.74* 11/10/2015   ABG:  INR: Will add last result for INR, ABG once components are confirmed Will add last 4 CBG results once components are confirmed  Assessment/Plan:  1. CV - Paced in the 60's. On Cozaar 100 mg daily and Bisoprolol 10 mg daily. 2.  Hyponatremia resolved-sodium  increased to 137 3. Creatinine slightly increased to 1.38. On Lasix 20 mg bid but has been for awhile. Recheck in am 4.  Acute blood loss anemia - H and H up to 9.7 and 31 5.ID-  On Vanco  and Cefepime 6. JP drains with 30 cc sero sanguinous drainage last 24 hours. Wound VAC care per Dr. Marla Roe 7. Pulmonary-On 2 liters of oxygen via [place]. Wean to room air as tolerates.  ZIMMERMAN,DONIELLE MPA-C 11/23/2015,7:59 AM    I have seen and examined the patient and agree with the assessment and plan as outlined.  Will stop lasix for now.  Need to check daily weights.  Probably could be transferred off of step down to 2W telemetry.  Will need to restart warfarin once it's okay with Dr Marla Roe  Rexene Alberts, MD 11/23/2015 9:13 AM

## 2015-11-23 NOTE — Progress Notes (Signed)
2 Days Post-Op  Subjective: The patient is sitting up in bed.  Doing well from second surgery.  VAC in place with good seal.  Drains doing well.    Objective: Vital signs in last 24 hours: Temp:  [97.6 F (36.4 C)-97.7 F (36.5 C)] 97.7 F (36.5 C) (03/16 1401) Pulse Rate:  [61-62] 61 (03/16 1401) Resp:  [18] 18 (03/16 1401) BP: (117-122)/(49-57) 122/57 mmHg (03/16 1401) SpO2:  [99 %] 99 % (03/16 1401) Last BM Date: 11/23/15 (very small)  Intake/Output from previous day: 03/15 0701 - 03/16 0700 In: 783 [P.O.:720; I.V.:3] Out: 720 [Urine:700; Drains:20] Intake/Output this shift:    General appearance: alert, cooperative and no distress Incision/Wound:VAC in place  Lab Results:   Recent Labs  11/21/15 0500 11/22/15 0330  WBC 9.6 8.3  HGB 7.1* 9.7*  HCT 22.5* 31.0*  PLT 187 217   BMET  Recent Labs  11/21/15 0500 11/22/15 0330  NA 137 137  K 4.1 4.4  CL 102 103  CO2 25 25  GLUCOSE 143* 126*  BUN 25* 24*  CREATININE 0.97 0.89  CALCIUM 8.9 8.7*   PT/INR No results for input(s): LABPROT, INR in the last 72 hours. ABG No results for input(s): PHART, HCO3 in the last 72 hours.  Invalid input(s): PCO2, PO2  Studies/Results: No results found.  Anti-infectives: Anti-infectives    Start     Dose/Rate Route Frequency Ordered Stop   11/21/15 0757  polymyxin B 500,000 Units, bacitracin 50,000 Units in sodium chloride irrigation 0.9 % 500 mL irrigation  Status:  Discontinued       As needed 11/21/15 0758 11/21/15 0831   11/21/15 0700  ciprofloxacin (CIPRO) IVPB 400 mg  Status:  Discontinued     400 mg 200 mL/hr over 60 Minutes Intravenous To ShortStay Surgical 11/20/15 1446 11/21/15 0921   11/18/15 1115  polymyxin B 500,000 Units, bacitracin 50,000 Units in sodium chloride irrigation 0.9 % 500 mL irrigation  Status:  Discontinued       As needed 11/18/15 1116 11/18/15 1216   11/18/15 0700  ciprofloxacin (CIPRO) IVPB 400 mg     400 mg 200 mL/hr over 60 Minutes  Intravenous On call to O.R. 11/17/15 2052 11/18/15 0900   11/14/15 1500  ceFEPIme (MAXIPIME) 2 g in dextrose 5 % 50 mL IVPB     2 g 100 mL/hr over 30 Minutes Intravenous Every 12 hours 11/14/15 1448 11/25/15 2359   11/13/15 0130  cefUROXime (ZINACEF) 1.5 g in dextrose 5 % 50 mL IVPB     1.5 g 100 mL/hr over 30 Minutes Intravenous To Short Stay 11/13/15 [id number] 11/13/15 1421   11/11/15 0230  vancomycin (VANCOCIN) IVPB 1000 mg/200 mL premix     1,000 mg 200 mL/hr over 60 Minutes Intravenous Every 24 hours 11/10/15 1339 11/25/15 2359   11/10/15 1345  vancomycin (VANCOCIN) IVPB 1000 mg/200 mL premix     1,000 mg 200 mL/hr over 60 Minutes Intravenous  Once 11/10/15 1335 11/10/15 1510   11/10/15 1245  ciprofloxacin (CIPRO) tablet 500 mg  Status:  Discontinued     500 mg Oral 2 times daily 11/10/15 1244 11/14/15 1448      Assessment/Plan: s/p Procedure(s): IRRIGATION AND DEBRIDEMENT WOUND (N/A) APPLICATION OF WOUND VAC (N/A) APPLICATION OF A-CELL OF CHEST/ABDOMEN (N/A) Discharge possible when VAC arrives.  Plan for more Acell in next 2-3 weeks.  Will follow in office in 2 weeks.  HH to change VAC Wed next week.  LOS: 13 days  [name] 11/23/2015

## 2015-11-24 LAB — PROTIME-INR
INR: 1.15 (ref 0.00–1.49)
Prothrombin Time: 14.9 seconds (ref 11.6–15.2)

## 2015-11-24 NOTE — Progress Notes (Signed)
Utilization review completed.  

## 2015-11-24 NOTE — Care Management Important Message (Signed)
Important Message  Patient Details  Name: PRESIOUS STROW MRN: [id number] Date of Birth: 09-Feb-1935   Medicare Important Message Given:  Yes    Danaisha Celli P Lyan Holck 11/24/2015, 2:28 PM

## 2015-11-24 NOTE — Progress Notes (Addendum)
      [address]       [place] [address]             [phone]        1 Days Post-Op Procedure(s) (LRB): IRRIGATION AND DEBRIDEMENT WOUND (N/A) APPLICATION OF WOUND VAC (N/A) APPLICATION OF A-CELL OF CHEST/ABDOMEN (N/A)  Subjective: Patient in good spirits.  Objective: Vital signs in last 24 hours: Temp:  [97.4 F (36.3 C)-97.7 F (36.5 C)] 97.4 F (36.3 C) (03/17 0543) Pulse Rate:  [59-62] 62 (03/17 0543) Cardiac Rhythm:  [-] Atrial paced (03/16 2001) Resp:  [18] 18 (03/17 0543) BP: (122-132)/(53-57) 132/55 mmHg (03/17 0543) SpO2:  [96 %-99 %] 96 % (03/17 0543) Weight:  [134 lb 3.2 oz (60.873 kg)] 134 lb 3.2 oz (60.873 kg) (03/17 0543)   Current Weight  11/24/15 134 lb 3.2 oz (60.873 kg)      Intake/Output from previous day: 03/16 0701 - 03/17 0700 In: 10 [I.V.:10] Out: 30 [Drains:30]   Physical Exam:  Cardiovascular: Paced Pulmonary: Slightly diminished at bases Abdomen: Soft, non tender, bowel sounds present. Extremities: SCDs in place Wound: VAC and Acell in place  Lab Results: CBC:  Recent Labs  11/22/15 0330  WBC 8.3  HGB 9.7*  HCT 31.0*  PLT 217   BMET:   Recent Labs  11/22/15 0330  NA 137  K 4.4  CL 103  CO2 25  GLUCOSE 126*  BUN 24*  CREATININE 0.89  CALCIUM 8.7*    PT/INR:  Lab Results  Component Value Date   INR 1.15 11/24/2015   INR 1.29 11/13/2015   INR 1.40 11/12/2015   ABG:  INR: Will add last result for INR, ABG once components are confirmed Will add last 4 CBG results once components are confirmed  Assessment/Plan:  1. CV - Paced in the 60's. On Cozaar 100 mg daily, Bisoprolol 10 mg daily, and Coumadin. INR 1.15. 2.  Acute blood loss anemia - Last H and H up to 9.7 and 31 3.ID- On Vanco  and Cefepime 4. Per Dr. Marla Roe, she may be discharged once [facility] arrives. Acell again in next 2-3 weeks. HH to change VAC next Wednesday. Likely discharge Monday   ZIMMERMAN,DONIELLE  MPA-C 11/24/2015,7:44 AM    I have seen and examined the patient and agree with the assessment and plan as outlined.  Can d/c home on Sunday or Monday if home VAC in place and all appropriate home health arrangements in place for [facility], wound care, and management of JP drains.  Patient does not need to be therapeutic on warfarin to be d/c'd home.  Will need INR checked and results sent to Dr Tyrell Antonio office in [place].  Rexene Alberts, MD 11/24/2015 4:33 PM

## 2015-11-24 NOTE — Discharge Summary (Signed)
Physician Discharge Summary       [address]       [place] [address]             [phone]    Patient ID: [name] MRN: [id number] DOB/AGE: [birth year] 81 y.o.  Admit date: 11/10/2015 Discharge date: 11/27/2015  Admission Diagnosis: Delayed sternal wound infection  Active Diagnoses:  1.  S/P mitral valve replacement with bioprosthetic valve, tricuspid valve repair, maze procedure and CABG x1 2. Chronic diastolic CHF (congestive heart failure) ([place]) 3. S/P placement of cardiac pacemaker 05/02/15 for CHB, [employer] 4. Pulmonary hypertension ([place]) 5. Essential hypertension 6. PAF (paroxysmal atrial fibrillation) ([place]) 7. GERD (gastroesophageal reflux disease) 8. Right breast cancer ([place]) 9. Hypothyroidism 10. COPD (chronic obstructive pulmonary disease) ([place]) 11. Hyperlipidemia 12. Meniere disease 13. Arthritis 14. Anxiety 15. History of tobacco abuse Consults: Plastic surgery, Wound Care, and Infectious disease  Procedure (s):  1. Debridement of latissimus flap (skin and fat) to chest wall defect with placement of Acell (7 x 10 cm sheet and 1.5 gm powder) and VAC placement by Dr. Marla Roe on 11/21/2015. 2. Latissimus myocutaneous flap to reconstruct the chest defect by Dr. Marla Roe on 11/18/2015. 3. Excisional debridement of sternal wound, right chest wall and breast mass by Dr. Roxy Manns on 11/13/2015.  History of Presenting Illness: The patient is a 81 year old white female who is status post mitral valve replacement, tricuspid valve repair, CABGx 1 with maze procedure in August of last year. She has had some ongoing difficulties with her sternal incision. She has been on oral antibiotics, however recently the sternal drainage is felt to be substantially increased. Her husband called our office today and was told to present to the emergency department for further evaluation. She denies fevers, chills or other constitutional symptoms. In general,  she feels quite well. She is felt to require further evaluation and treatment to include CT scan and may require surgical drainage/debridement of some kind depending on results and clinical course. She was put on IV Vancomycin in addition to Cipro. Her Coumadin was put on hold in anticipation of probable incision and drainage of the sternal wound.  Brief Hospital Course:  CT scan of the chest showed a persistent thickening of the scar and there are persistent inflammatory changes with stranding of adjacent subcutaneous fat along the scar. Findings are consistent with inflammation or infection probable cellulitis along the scar. There is no evidence of subcutaneous air. No confluent fluid collection is noted to suggest an abscess.There was no mediastinal hematoma or adenopathy. A wound care consult was obtained to assist with VAC change. An infectious disease consult was obtained with Dr. Megan Salon as well. Cipro was stopped and Cefepime and Vanco were started at his recommendation. This was finished on 11/25/2015. A plastic surgery consult was then obtained with Dr. Marla Roe. Patient underwent latissimus myocutaneous flap to reconstruct the chest defect on 03/11. Unfortunately, the flap was dusky despite Nitro paste. The patient required debridement of latissimus flap to chest wall defect with placement of Acell and VAC on 03/14. She remains afebrile and VAC and Acell are in place. Home health will change the wound vac on Wedensday 03/22. In addition, we will ask them to draw a PT and INR on this date and call or fax results to Dr. Tyrell Antonio office. She is on her home dose of Coumadin. Her INR today is 1.14. She is felt surgically stable for discharge today.    Latest Vital Signs: Blood pressure 136/42, pulse  62, temperature 97.5 F (36.4 C), temperature source Oral, resp. rate 17, height 5' 1.5" (1.562 m), weight 137 lb (62.143 kg), SpO2 96 %.  Physical Exam: Cardiovascular: Paced Pulmonary: Slightly  diminished at bases Abdomen: Soft, non tender, bowel sounds present. Extremities: SCDs in place Wound: VAC and Acell in place  Discharge Condition:Stable and discharged to home.  Recent laboratory studies:  Lab Results  Component Value Date   WBC 8.3 11/22/2015   HGB 9.7* 11/22/2015   HCT 31.0* 11/22/2015   MCV 88.1 11/22/2015   PLT 217 11/22/2015   Lab Results  Component Value Date   NA 142 11/25/2015   K 4.3 11/25/2015   CL 105 11/25/2015   CO2 27 11/25/2015   CREATININE 0.67 11/25/2015   GLUCOSE 146* 11/25/2015   Diagnostic Studies: Ct Chest W Contrast  11/10/2015  CLINICAL DATA:  Sternal wound infection, de wound opened 2 weeks ago and is getting bigger and oozing EXAM: CT CHEST WITH CONTRAST TECHNIQUE: Multidetector CT imaging of the chest was performed during intravenous contrast administration. CONTRAST:  61mL OMNIPAQUE IOHEXOL 300 MG/ML  SOLN COMPARISON:  05/08/2015 and 10/16/2015 FINDINGS: Sagittal images of the spine shows degenerative changes thoracic spine. Central airways are patent. The patient is status post median sternotomy. Atherosclerotic calcifications of thoracic aorta again noted. There is no mediastinal hematoma or adenopathy. No aortic dissection. No evidence of pulmonary embolus. Central pulmonary artery is unremarkable. The patient is status post atrial appendage clipping and CABG. Images of the lung parenchyma shows no acute infiltrate or pulmonary edema. There is no evidence of retrosternal hematoma or fluid collection. No evidence of anterior mediastinal abscess or fluid collection. Again noted midline anterior chest wall scarring. The previous wound defect at this level is closed. There is persistent thickening of the scar and there are persistent inflammatory changes with stranding of adjacent subcutaneous fat along the scar. Findings are consistent with infection probable cellulitis along the scar. There is no evidence of subcutaneous air. No confluent fluid  collection is noted to suggest an abscess. Persistent some skin thickening in right breast probable inflammatory changes. In axial image 38 there is persistent irregular soft tissue density just anterior to anterior ribs, medial deeper aspect of the right breast. Again this is about 4 cm lateral to midline wall scar. There is soft tissue density measures 2.8 by 1.6 cm thickness. There is no significant change in size in appearance from prior exam. This may represent focal scarring, focal inflammatory process or less likely neoplastic process. No definite underlying bone destruction is noted to suggest osteomyelitis. There is no definite bone destruction within sternum. Stable calcified granuloma in right upper lobe laterally. IMPRESSION: 1. Again noted midline anterior chest wall scarring. The previous wound defect at this level is closed. There is persistent thickening of the scar and there are persistent inflammatory changes with stranding of adjacent subcutaneous fat along the scar. Findings are consistent with inflammation or infection probable cellulitis along the scar. There is no evidence of subcutaneous air. No confluent fluid collection is noted to suggest an abscess. 2. Persistent irregular soft tissue density deep in right breast medially about 4 cm laterally to the scar. This is stable in size in appearance from prior exam. May represent focal scarring focal inflammatory process or less likely neoplastic process. There is no underlying bone destruction. No evidence of bone destruction within sternum 3. Persistent some skin thickening in right breast probable inflammatory changes. 4. No acute infiltrate or pulmonary edema. 5. No mediastinal  hematoma or adenopathy. 6. Stable postsurgical changes post median sternotomy and clipping of atrial appendage status post CABG. Electronically Signed   By: Lahoma Crocker M.D.   On: 11/10/2015 14:34    Discharge Medications:   Medication List    STOP taking these  medications        ciprofloxacin 500 MG tablet  Commonly known as:  CIPRO      TAKE these medications        acetaminophen 325 MG tablet  Commonly known as:  TYLENOL  Take 2 tablets (650 mg total) by mouth every 4 (four) hours as needed for mild pain (or Fever >/= 101).     aspirin 81 MG EC tablet  Take 1 tablet (81 mg total) by mouth daily.     benzonatate 100 MG capsule  Commonly known as:  TESSALON  Take 100 mg by mouth 3 (three) times daily as needed for cough (for cold).     bisoprolol 10 MG tablet  Commonly known as:  ZEBETA  Take 1 tablet (10 mg total) by mouth daily.     fluticasone 50 MCG/ACT nasal spray  Commonly known as:  FLONASE  Place 1 spray into both nostrils daily as needed for allergies.     furosemide 20 MG tablet  Commonly known as:  LASIX  Take 20 mg by mouth 2 (two) times daily.     gabapentin 100 MG capsule  Commonly known as:  NEURONTIN  Take 100 mg by mouth 2 (two) times daily.     ipratropium-albuterol 0.5-2.5 (3) MG/3ML Soln  Commonly known as:  DUONEB  Take 3 mLs by nebulization every 6 (six) hours as needed (for shortness of breath).     levothyroxine 88 MCG tablet  Commonly known as:  SYNTHROID, LEVOTHROID  Take 88 mcg by mouth daily before breakfast.     losartan 100 MG tablet  Commonly known as:  COZAAR  Take 1 tablet (100 mg total) by mouth daily.     pantoprazole 40 MG tablet  Commonly known as:  PROTONIX  Take 40 mg by mouth 2 (two) times daily.     potassium chloride 10 MEQ tablet  Commonly known as:  K-DUR,KLOR-CON  Take 1 tablet (10 mEq total) by mouth daily.     simvastatin 20 MG tablet  Commonly known as:  ZOCOR  Take 20 mg by mouth daily.     spironolactone 25 MG tablet  Commonly known as:  ALDACTONE  Take 1 tablet (25 mg total) by mouth daily.     traMADol 50 MG tablet  Commonly known as:  ULTRAM  Take 1-2 tablets (50-100 mg total) by mouth every 6 (six) hours as needed (mild pain).     warfarin 2.5 MG tablet   Commonly known as:  COUMADIN  TAKE 1 TABLET BY MOUTH AS DIRECTED        Follow Up Appointments: Follow-up Information    Follow up with [facility].   Why:  HHRN. Please change wound VAC on Wednesday 11/29/2015   Contact information:   [address] [place] [address] [phone]       Follow up with Wallace Going, DO In 1 week.   Specialty:  Plastic Surgery   Why:  Call for a follow up appointment for one week   Contact information:   [place] [address] [phone]       Follow up with Rexene Alberts, MD On 12/11/2015.  Specialty:  Cardiothoracic Surgery   Why:  PA/LAT CXR to be taken (at [facility] which is in the same building as Dr. Guy Sandifer office) on 12/11/2015 at 3:15 pm;Appointment time is at 4:00   Contact information:   [place] [address] [phone]       Follow up with [facility] On 11/29/2015.   Why:  Please check PT and INR (as is on Couamdin, INR goal 2-2.5) on Wednesday and call or fax results to Dr. Tyrell Antonio office      Follow up with Kathlyn Sacramento, MD On 12/01/2015.   Specialty:  Cardiology   Why:  Appointment time is at 10:45 am   Contact information:   [place] [address] [phone]       Follow up with [facility].   Why:  wound VAC arranged- to be delivered to room prior to discharge (estimate 11am 3/20)      Follow up with Home Health.   Why:  Please drain JP drain daily (patient also taught how to do) and record output daily      Signed: Maher Shon MPA-C 11/27/2015, 11:54 AM

## 2015-11-25 LAB — BASIC METABOLIC PANEL
Anion gap: 10 (ref 5–15)
BUN: 11 mg/dL (ref 6–20)
CALCIUM: 9.3 mg/dL (ref 8.9–10.3)
CO2: 27 mmol/L (ref 22–32)
CREATININE: 0.67 mg/dL (ref 0.44–1.00)
Chloride: 105 mmol/L (ref 101–111)
GFR calc Af Amer: >60 mL/min (ref >60)
Glucose, Bld: 146 mg/dL — ABNORMAL HIGH (ref 65–99)
Potassium: 4.3 mmol/L (ref 3.5–5.1)
SODIUM: 142 mmol/L (ref 135–145)

## 2015-11-25 LAB — PROTIME-INR
INR: 1.17 (ref 0.00–1.49)
PROTHROMBIN TIME: 15.1 s (ref 11.6–15.2)

## 2015-11-25 MED ORDER — ALTEPLASE 2 MG IJ SOLR
2.0000 mg | Freq: Once | INTRAMUSCULAR | Status: AC
  Administered 2015-11-25: 2 mg

## 2015-11-25 MED ORDER — FUROSEMIDE 10 MG/ML IJ SOLN
40.0000 mg | Freq: Once | INTRAMUSCULAR | Status: AC
  Administered 2015-11-25: 40 mg via INTRAVENOUS
  Filled 2015-11-25: qty 4

## 2015-11-25 MED ORDER — FUROSEMIDE 20 MG PO TABS
20.0000 mg | ORAL_TABLET | Freq: Two times a day (BID) | ORAL | Status: DC
  Administered 2015-11-26 – 2015-11-27 (×3): 20 mg via ORAL
  Filled 2015-11-25 (×3): qty 1

## 2015-11-25 NOTE — Progress Notes (Addendum)
      [address]       [place] [address]             [phone]      4 Days Post-Op Procedure(s) (LRB): IRRIGATION AND DEBRIDEMENT WOUND (N/A) APPLICATION OF WOUND VAC (N/A) APPLICATION OF A-CELL OF CHEST/ABDOMEN (N/A)   Subjective:  Ms. Moccia complains of shortness of breath this morning.  She states her weight went up 5 lbs since yesterday and she states she did not receive a fluid pill and thinks that is the cause.  Objective: Vital signs in last 24 hours: Temp:  [97.4 F (36.3 C)-98.2 F (36.8 C)] 97.4 F (36.3 C) (03/18 0508) Pulse Rate:  [61-70] 70 (03/18 0508) Cardiac Rhythm:  [-] Atrial paced (03/17 2000) Resp:  [18] 18 (03/18 0508) BP: (118-139)/(42-51) 133/51 mmHg (03/18 0508) SpO2:  [98 %-100 %] 100 % (03/18 0508) Weight:  [138 lb 8 oz (62.823 kg)] 138 lb 8 oz (62.823 kg) (03/18 0508)  Intake/Output from previous day: 03/17 0701 - 03/18 0700 In: -  Out: 40 [Drains:40]  General appearance: alert, cooperative and no distress Heart: regular rate and rhythm Lungs: clear to auscultation bilaterally Abdomen: soft, non-tender; bowel sounds normal; no masses,  no organomegaly Extremities: edema none appreciated Wound: wound vac in place  Lab Results: No results for input(s): WBC, HGB, HCT, PLT in the last 72 hours. BMET:  Recent Labs  11/25/15 0533  NA 142  K 4.3  CL 105  CO2 27  GLUCOSE 146*  BUN 11  CREATININE 0.67  CALCIUM 9.3    PT/INR:  Recent Labs  11/25/15 0533  LABPROT 15.1  INR 1.17   ABG    Component Value Date/Time   PHART 7.420 11/14/2015 0345   HCO3 31.5* 11/14/2015 0345   TCO2 33.0 11/14/2015 0345   ACIDBASEDEF 3.0* 04/26/2015 0832   O2SAT 98.4 11/14/2015 0345   CBG (last 3)  No results for input(s): GLUCAP in the last 72 hours.  Assessment/Plan: S/P Procedure(s) (LRB): IRRIGATION AND DEBRIDEMENT WOUND (N/A) APPLICATION OF WOUND VAC (N/A) APPLICATION OF A-CELL OF CHEST/ABDOMEN (N/A)  1. CV-  hemodynamically stable, INR at 1.17, continue coumadin 2. Pulm- complains of dyspnea, not on oxygen, continue IS 3. Renal- creatinine WNL, weight up 5 lbs since yesterday- will give IV Lasix today and resume home dose tomorrow 4. ID- Smoldering sternal infection, plastics following, awaiting home wound vac 5. Dispo- patient is stable, will continue ABX, diurese today, awaiting home wound vac equipment   LOS: 15 days    BARRETT, ERIN 11/25/2015  I have seen and examined the patient and agree with the assessment and plan as outlined.  Rexene Alberts, MD 11/25/2015 7:57 PM

## 2015-11-25 NOTE — Discharge Instructions (Signed)
Information on my medicine - Coumadin®   (Warfarin) ° °This medication education was reviewed with me or my healthcare representative as part of my discharge preparation.  The pharmacist that spoke with me during my hospital stay was:  Verlyn Lambert Stillinger, RPH ° °Why was Coumadin prescribed for you? °Coumadin was prescribed for you because you have a blood clot or a medical condition that can cause an increased risk of forming blood clots. Blood clots can cause serious health problems by blocking the flow of blood to the heart, lung, or brain. Coumadin can prevent harmful blood clots from forming. °As a reminder your indication for Coumadin is:   Stroke Prevention Because Of Atrial Fibrillation ° °What test will check on my response to Coumadin? °While on Coumadin (warfarin) you will need to have an INR test regularly to ensure that your dose is keeping you in the desired range. The INR (international normalized ratio) number is calculated from the result of the laboratory test called prothrombin time (PT). ° °If an INR APPOINTMENT HAS NOT ALREADY BEEN MADE FOR YOU please schedule an appointment to have this lab work done by your health care provider within 7 days. °Your INR goal is usually a number between:  2 to 3 or your provider may give you a more narrow range like 2-2.5.  Ask your health care provider during an office visit what your goal INR is. ° °What  do you need to  know  About  COUMADIN? °Take Coumadin (warfarin) exactly as prescribed by your healthcare provider about the same time each day.  DO NOT stop taking without talking to the doctor who prescribed the medication.  Stopping without other blood clot prevention medication to take the place of Coumadin may increase your risk of developing a new clot or stroke.  Get refills before you run out. ° °What do you do if you miss a dose? °If you miss a dose, take it as soon as you remember on the same day then continue your regularly scheduled  regimen the next day.  Do not take two doses of Coumadin at the same time. ° °Important Safety Information °A possible side effect of Coumadin (Warfarin) is an increased risk of bleeding. You should call your healthcare provider right away if you experience any of the following: °  Bleeding from an injury or your nose that does not stop. °  Unusual colored urine (red or dark brown) or unusual colored stools (red or black). °  Unusual bruising for unknown reasons. °  A serious fall or if you hit your head (even if there is no bleeding). ° °Some foods or medicines interact with Coumadin® (warfarin) and might alter your response to warfarin. To help avoid this: °  Eat a balanced diet, maintaining a consistent amount of Vitamin K. °  Notify your provider about major diet changes you plan to make. °  Avoid alcohol or limit your intake to 1 drink for women and 2 drinks for men per day. °(1 drink is 5 oz. wine, 12 oz. beer, or 1.5 oz. liquor.) ° °Make sure that ANY health care provider who prescribes medication for you knows that you are taking Coumadin (warfarin).  Also make sure the healthcare provider who is monitoring your Coumadin knows when you have started a new medication including herbals and non-prescription products. ° °Coumadin® (Warfarin)  Major Drug Interactions  °Increased Warfarin Effect Decreased Warfarin Effect  °Alcohol (large quantities) °Antibiotics (esp. Septra/Bactrim, Flagyl, Cipro) °Amiodarone (Cordarone) °Aspirin (  ASA) °Cimetidine (Tagamet) °Megestrol (Megace) °NSAIDs (ibuprofen, naproxen, etc.) °Piroxicam (Feldene) °Propafenone (Rythmol SR) °Propranolol (Inderal) °Isoniazid (INH) °Posaconazole (Noxafil) Barbiturates (Phenobarbital) °Carbamazepine (Tegretol) °Chlordiazepoxide (Librium) °Cholestyramine (Questran) °Griseofulvin °Oral Contraceptives °Rifampin °Sucralfate (Carafate) °Vitamin K  ° °Coumadin® (Warfarin) Major Herbal Interactions  °Increased Warfarin Effect Decreased Warfarin Effect    °Garlic °Ginseng °Ginkgo biloba Coenzyme Q10 °Green tea °St. John’s wort   ° °Coumadin® (Warfarin) FOOD Interactions  °Eat a consistent number of servings per week of foods HIGH in Vitamin K °(1 serving = ½ cup)  °Collards (cooked, or boiled & drained) °Kale (cooked, or boiled & drained) °Mustard greens (cooked, or boiled & drained) °Parsley *serving size only = ¼ cup °Spinach (cooked, or boiled & drained) °Swiss chard (cooked, or boiled & drained) °Turnip greens (cooked, or boiled & drained)  °Eat a consistent number of servings per week of foods MEDIUM-HIGH in Vitamin K °(1 serving = 1 cup)  °Asparagus (cooked, or boiled & drained) °Broccoli (cooked, boiled & drained, or raw & chopped) °Brussel sprouts (cooked, or boiled & drained) *serving size only = ½ cup °Lettuce, raw (green leaf, endive, romaine) °Spinach, raw °Turnip greens, raw & chopped  ° °These websites have more information on Coumadin (warfarin):  www.coumadin.com; °www.ahrq.gov/consumer/coumadin.htm; ° ° ° °

## 2015-11-26 LAB — PROTIME-INR
INR: 1.16 (ref 0.00–1.49)
PROTHROMBIN TIME: 15 s (ref 11.6–15.2)

## 2015-11-26 MED ORDER — FLUTICASONE PROPIONATE 50 MCG/ACT NA SUSP
1.0000 | Freq: Every day | NASAL | Status: DC
  Administered 2015-11-26 – 2015-11-27 (×2): 1 via NASAL
  Filled 2015-11-26: qty 16

## 2015-11-26 NOTE — Progress Notes (Addendum)
      [address]       [place] [address]             [phone]      5 Days Post-Op Procedure(s) (LRB): IRRIGATION AND DEBRIDEMENT WOUND (N/A) APPLICATION OF WOUND VAC (N/A) APPLICATION OF A-CELL OF CHEST/ABDOMEN (N/A)   Subjective:  Ms. Burrous is feeling better today.  She is no longer short of breath.  Wants to go home  Objective: Vital signs in last 24 hours: Temp:  [97.5 F (36.4 C)-98.6 F (37 C)] 97.5 F (36.4 C) (03/19 0429) Pulse Rate:  [59-64] 59 (03/19 0429) Cardiac Rhythm:  [-] Atrial paced;Heart block (03/18 1931) Resp:  [17-18] 18 (03/19 0429) BP: (127-152)/(43-55) 141/53 mmHg (03/19 0429) SpO2:  [100 %] 100 % (03/19 0429) Weight:  [137 lb 8 oz (62.37 kg)] 137 lb 8 oz (62.37 kg) (03/19 0429)  Intake/Output from previous day: 03/18 0701 - 03/19 0700 In: 60 [P.O.:326] Out: 993 [Urine:900; Drains:93]  General appearance: alert, cooperative and no distress Heart: regular rate and rhythm Lungs: clear to auscultation bilaterally Abdomen: soft, non-tender; bowel sounds normal; no masses,  no organomegaly Extremities: edema none appreciated Wound: wound vac in place  Lab Results: No results for input(s): WBC, HGB, HCT, PLT in the last 72 hours. BMET:  Recent Labs  11/25/15 0533  NA 142  K 4.3  CL 105  CO2 27  GLUCOSE 146*  BUN 11  CREATININE 0.67  CALCIUM 9.3    PT/INR:  Recent Labs  11/26/15 0540  LABPROT 15.0  INR 1.16   ABG    Component Value Date/Time   PHART 7.420 11/14/2015 0345   HCO3 31.5* 11/14/2015 0345   TCO2 33.0 11/14/2015 0345   ACIDBASEDEF 3.0* 04/26/2015 0832   O2SAT 98.4 11/14/2015 0345   CBG (last 3)  No results for input(s): GLUCAP in the last 72 hours.  Assessment/Plan: S/P Procedure(s) (LRB): IRRIGATION AND DEBRIDEMENT WOUND (N/A) APPLICATION OF WOUND VAC (N/A) APPLICATION OF A-CELL OF CHEST/ABDOMEN (N/A)  1. CV- hemodynamically stable,  Bradycardic at times- continue home bisoprolol and  Cozaar 2. INR 1.16- patient currently on home regimen will continue 3. Pulm- dyspnea resolved, continue IS, not requiring oxygen 4. Renal- creatinine remains stable, continue Lasix for mild hypervolemia 5. ID- smoldering sternal infection, plastics performed Latissimus Flap, continue wound vac 6. Dispo- patient is stable, awaiting wound vac approval, home once obtained   LOS: 16 days    BARRETT, ERIN 11/26/2015  I have seen and examined the patient and agree with the assessment and plan as outlined.  Ready for d/c - waiting for home VAC.  Follow up per Dr Marla Roe.  Follow up with me next August (should already have an appt) or sooner should specific problems or questions arise.  Rexene Alberts, MD 11/26/2015 12:48 PM

## 2015-11-27 ENCOUNTER — Encounter: Payer: Self-pay | Admitting: *Deleted

## 2015-11-27 ENCOUNTER — Telehealth: Payer: Self-pay | Admitting: *Deleted

## 2015-11-27 DIAGNOSIS — Z952 Presence of prosthetic heart valve: Secondary | ICD-10-CM

## 2015-11-27 LAB — PROTIME-INR
INR: 1.14 (ref 0.00–1.49)
Prothrombin Time: 14.8 seconds (ref 11.6–15.2)

## 2015-11-27 MED ORDER — TRAMADOL HCL 50 MG PO TABS: 50.0000 mg | ORAL_TABLET | Freq: Four times a day (QID) | ORAL | Status: DC | PRN

## 2015-11-27 MED ORDER — POTASSIUM CHLORIDE CRYS ER 10 MEQ PO TBCR: 10.0000 meq | EXTENDED_RELEASE_TABLET | Freq: Every day | ORAL | Status: DC

## 2015-11-27 NOTE — Telephone Encounter (Signed)
I called [name]'s home and her husband answered. He said she just got out of [facility] for another debridment of her chest wall.

## 2015-11-27 NOTE — Progress Notes (Signed)
Cardiac Individual Treatment Plan  Patient Details  Name: [name] MRN: [id number] Date of Birth: 11-27-1934 Referring Provider:  No ref. provider found  Initial Encounter Date:       Cardiac Rehab from 07/03/2015 in [facility] Cardiac and Pulmonary Rehab   Date  07/03/15      Visit Diagnosis: S/P MVR (mitral valve replacement)  Patient's Home Medications on Admission:  Current outpatient prescriptions:  .  acetaminophen (TYLENOL) 325 MG tablet, Take 2 tablets (650 mg total) by mouth every 4 (four) hours as needed for mild pain (or Fever >/= 101)., Disp: , Rfl:  .  aspirin EC 81 MG EC tablet, Take 1 tablet (81 mg total) by mouth daily., Disp: , Rfl:  .  benzonatate (TESSALON) 100 MG capsule, Take 100 mg by mouth 3 (three) times daily as needed for cough (for cold). , Disp: , Rfl:  .  bisoprolol (ZEBETA) 10 MG tablet, Take 1 tablet (10 mg total) by mouth daily., Disp: 30 tablet, Rfl: 5 .  fluticasone (FLONASE) 50 MCG/ACT nasal spray, Place 1 spray into both nostrils daily as needed for allergies. , Disp: , Rfl:  .  furosemide (LASIX) 20 MG tablet, Take 20 mg by mouth 2 (two) times daily. , Disp: , Rfl:  .  gabapentin (NEURONTIN) 100 MG capsule, Take 100 mg by mouth 2 (two) times daily. , Disp: , Rfl: 1 .  ipratropium-albuterol (DUONEB) 0.5-2.5 (3) MG/3ML SOLN, Take 3 mLs by nebulization every 6 (six) hours as needed (for shortness of breath)., Disp: 360 mL, Rfl: 10 .  levothyroxine (SYNTHROID, LEVOTHROID) 88 MCG tablet, Take 88 mcg by mouth daily before breakfast., Disp: , Rfl:  .  losartan (COZAAR) 100 MG tablet, Take 1 tablet (100 mg total) by mouth daily., Disp: 30 tablet, Rfl: 3 .  pantoprazole (PROTONIX) 40 MG tablet, Take 40 mg by mouth 2 (two) times daily. , Disp: , Rfl:  .  potassium chloride SA (K-DUR,KLOR-CON) 10 MEQ tablet, Take 1 tablet (10 mEq total) by mouth daily., Disp: 30 tablet, Rfl: 1 .  simvastatin (ZOCOR) 20 MG tablet, Take 20 mg by mouth daily., Disp: , Rfl:  .   spironolactone (ALDACTONE) 25 MG tablet, Take 1 tablet (25 mg total) by mouth daily., Disp: 30 tablet, Rfl: 6 .  traMADol (ULTRAM) 50 MG tablet, Take 1-2 tablets (50-100 mg total) by mouth every 6 (six) hours as needed (mild pain)., Disp: 30 tablet, Rfl: 0 .  warfarin (COUMADIN) 2.5 MG tablet, TAKE 1 TABLET BY MOUTH AS DIRECTED (Patient taking differently: TAKE 1 TABLET BY MOUTH DAILY EXCEPT MONDAYS. PT TAKES 1.25MG ON MONDAYS), Disp: 30 tablet, Rfl: 2 No current facility-administered medications for this visit.  Facility-Administered Medications Ordered in Other Visits:  .  0.9 %  sodium chloride infusion, 250 mL, Intravenous, PRN, Wayne E Gold, PA-C .  0.9 %  sodium chloride infusion, , Intravenous, Once, Rexene Alberts, MD, Last Rate: 10 mL/hr at 11/21/15 1630, 10 mL/hr at 11/21/15 1630 .  acetaminophen (TYLENOL) tablet 650 mg, 650 mg, Oral, Q6H PRN, 650 mg at 11/27/15 0803 **OR** acetaminophen (TYLENOL) suppository 650 mg, 650 mg, Rectal, Q6H PRN, Wayne E Gold, PA-C .  aspirin EC tablet 81 mg, 81 mg, Oral, Daily, Wayne E Gold, PA-C, 81 mg at 11/27/15 0086 .  benzonatate (TESSALON) capsule 100 mg, 100 mg, Oral, TID PRN, Wayne E Gold, PA-C .  bisacodyl (DULCOLAX) EC tablet 10 mg, 10 mg, Oral, Daily, Rexene Alberts, MD, 10 mg at  11/23/15 0953 .  bisoprolol (ZEBETA) tablet 10 mg, 10 mg, Oral, Daily, Wayne E Gold, PA-C, 10 mg at 11/27/15 8299 .  docusate sodium (COLACE) capsule 100 mg, 100 mg, Oral, BID, Wayne E Gold, PA-C, 100 mg at 11/26/15 2146 .  enoxaparin (LOVENOX) injection 30 mg, 30 mg, Subcutaneous, Daily, 30 mg at 11/27/15 0939 **AND** no pharmacologic VTE prophylaxis, , , Once, Rexene Alberts, MD .  fluticasone (FLONASE) 50 MCG/ACT nasal spray 1 spray, 1 spray, Each Nare, Daily, Erin R Barrett, PA-C, 1 spray at 11/27/15 0940 .  furosemide (LASIX) tablet 20 mg, 20 mg, Oral, BID, Erin R Barrett, PA-C, 20 mg at 11/27/15 0800 .  gabapentin (NEURONTIN) capsule 100 mg, 100 mg, Oral, BID,  Wayne E Gold, PA-C, 100 mg at 11/27/15 3716 .  ipratropium-albuterol (DUONEB) 0.5-2.5 (3) MG/3ML nebulizer solution 3 mL, 3 mL, Nebulization, Q6H PRN, Wayne E Gold, PA-C .  lactulose (CHRONULAC) 10 GM/15ML solution 20 g, 20 g, Oral, Daily PRN, Wayne E Gold, PA-C, 20 g at 11/23/15 0517 .  levothyroxine (SYNTHROID, LEVOTHROID) tablet 88 mcg, 88 mcg, Oral, QAC breakfast, Wayne E Gold, PA-C, 88 mcg at 11/27/15 0800 .  losartan (COZAAR) tablet 100 mg, 100 mg, Oral, Daily, Wayne E Gold, PA-C, 100 mg at 11/27/15 9678 .  magnesium hydroxide (MILK OF MAGNESIA) suspension 30 mL, 30 mL, Oral, Daily PRN, Wayne E Gold, PA-C, 30 mL at 11/11/15 2009 .  morphine 2 MG/ML injection 2 mg, 2 mg, Intravenous, Q1H PRN, Rexene Alberts, MD, 2 mg at 11/17/15 0759 .  ondansetron (ZOFRAN) tablet 4 mg, 4 mg, Oral, Q6H PRN **OR** ondansetron (ZOFRAN) injection 4 mg, 4 mg, Intravenous, Q6H PRN, Wayne E Gold, PA-C .  ondansetron (ZOFRAN) injection 4 mg, 4 mg, Intravenous, Q6H PRN, Rexene Alberts, MD .  oxyCODONE (Oxy IR/ROXICODONE) immediate release tablet 5-10 mg, 5-10 mg, Oral, Q4H PRN, Rexene Alberts, MD, 10 mg at 11/19/15 0947 .  pantoprazole (PROTONIX) EC tablet 40 mg, 40 mg, Oral, BID, Wayne E Gold, PA-C, 40 mg at 11/27/15 9381 .  senna-docusate (Senokot-S) tablet 1 tablet, 1 tablet, Oral, QHS, Rexene Alberts, MD, 1 tablet at 11/26/15 2146 .  simvastatin (ZOCOR) tablet 20 mg, 20 mg, Oral, Daily, Wayne E Gold, PA-C, 20 mg at 11/27/15 0175 .  sodium chloride flush (NS) 0.9 % injection 10-40 mL, 10-40 mL, Intracatheter, PRN, Michel Bickers, MD, 10 mL at 11/22/15 0319 .  sodium chloride flush (NS) 0.9 % injection 10-40 mL, 10-40 mL, Intracatheter, PRN, Rexene Alberts, MD, 10 mL at 11/27/15 0357 .  sodium chloride flush (NS) 0.9 % injection 3 mL, 3 mL, Intravenous, Q12H, Wayne E Gold, PA-C, 3 mL at 11/25/15 1000 .  traMADol (ULTRAM) tablet 50-100 mg, 50-100 mg, Oral, Q6H PRN, Rexene Alberts, MD, 50 mg at 11/26/15 2146 .   warfarin (COUMADIN) tablet 1.25 mg, 1.25 mg, Oral, Q Mon-1800, Rexene Alberts, MD .  warfarin (COUMADIN) tablet 2.5 mg, 2.5 mg, Oral, Once per day on Sun Tue Wed Thu Fri Sat, Rexene Alberts, MD, 2.5 mg at 11/26/15 1757 .  Warfarin - Physician Dosing Inpatient, , Does not apply, q1800, Rexene Alberts, MD  Past Medical History: Past Medical History  Diagnosis Date  . Hypertension   . COPD (chronic obstructive pulmonary disease) ([place])   . Hyperlipidemia   . Meniere disease   . PAF (paroxysmal atrial fibrillation) ([place])   . COPD (chronic obstructive pulmonary disease) ([place])   .  Hypothyroidism   . Pulmonary hypertension ([place])   . Chronic diastolic CHF (congestive heart failure) ([place])   . PONV (postoperative nausea and vomiting)   . Anxiety   . GERD (gastroesophageal reflux disease)   . Severe Mitral Regurgitation s/p MV Repair     a. 04/2015 s/p 27 mm [facility] Mitral bovine bioprosthetic tissue valve  . Tricuspid Regurgitation s/p Repair     a. 04/2015 s/p 26 mm [facility] mc3 ring annuloplasty  . 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 04/2015 Complete bilateral atrial lesion set using cryothermy and bipolar radiofrequency ablation with clipping of LA appendage  . CAD s/p CABG x 1     a. 04/2015 LIMA to LAD  . Post-surgical complete heart block, symptomatic     a. 04/2015 s/p MDT [id number] [facility] DR MRI DC PPM.  . Wound infection ([place]) 10/13/2015    Superficial sternal wound infection  . Cancer of right breast ([place]) 08/09/2001  . Presence of permanent cardiac pacemaker   . On home oxygen therapy     "2L at night" (11/10/2015)  . Pneumonia ~ 2010  . Arthritis     "hands" (11/10/2015)  . Surgical wound, non healing - chest wall 11/10/2015    Tobacco Use: History  Smoking status  . Former Smoker -- 1.50 packs/day for 40 years  . Types: Cigarettes  . Quit date: 04/20/1998  Smokeless tobacco  . Never Used    Labs: Recent Review Flowsheet Data    Labs for ITP Cardiac and Pulmonary  Rehab Latest Ref Rng 04/28/2015 04/29/2015 04/30/2015 11/13/2015 11/14/2015   PHART 7.350 - 7.450 - - - 7.428 7.420   PCO2ART 35.0 - 45.0 mmHg - - - 41.1 49.4(H)   HCO3 20.0 - 24.0 mEq/L - - - 26.7(H) 31.5(H)   TCO2 0 - 100 mmol/L - 34 34 27.9 33.0   O2SAT - 65.3 61.7 - 95.2 98.4       Exercise Target Goals:    Exercise Program Goal: Individual exercise prescription set with THRR, safety & activity barriers. Participant demonstrates ability to understand and report RPE using BORG scale, to self-measure pulse accurately, and to acknowledge the importance of the exercise prescription.  Exercise Prescription Goal: Starting with aerobic activity 30 plus minutes a day, 3 days per week for initial exercise prescription. Provide home exercise prescription and guidelines that participant acknowledges understanding prior to discharge.  Activity Barriers & Risk Stratification:     Activity Barriers & Cardiac Risk Stratification - 07/03/15 1335    Activity Barriers & Cardiac Risk Stratification   Activity Barriers Balance Concerns;Deconditioning;Joint Problems  Meineres disease= balance concerns, bursitis left hip and leg, sternal open wound   Cardiac Risk Stratification Moderate      6 Minute Walk:     6 Minute Walk      07/03/15 1424       6 Minute Walk   Phase Initial     Distance 450 feet  NS Test: 450 steps     Walk Time 6 minutes     RPE 11     Symptoms No     Resting HR 69 bpm     Resting BP 132/80 mmHg     Max Ex. HR 84 bpm     Max Ex. BP 138/74 mmHg        Initial Exercise Prescription:     Initial Exercise Prescription - 07/03/15 1400    Date of Initial Exercise Prescription  Date 07/03/15   Bike   Level 0.5   Minutes 15   Recumbant Bike   Level 2   RPM 30   Watts 20   Minutes 15   NuStep   Level 2   Watts 30   Minutes 15   Cybex   Level 1   RPM 30   Minutes 15   Recumbant Elliptical   Level 1   Watts 20   Minutes 15   REL-XR   Level 2   Watts  30   Minutes 15   Prescription Details   Frequency (times per week) 3   Duration Progress to 30 minutes of continuous aerobic without signs/symptoms of physical distress   Intensity   THRR REST +  30   Progression   Progression Continue progressive overload as per policy without signs/symptoms or physical distress.  no upper body exercise due to sternal wound precautions   Resistance Training   Training Prescription No  ROM only due to sternal wound precautions      Perform Capillary Blood Glucose checks as needed.  Exercise Prescription Changes:     Exercise Prescription Changes      07/11/15 0600 07/21/15 0900 07/26/15 0800 08/07/15 1500 08/09/15 0750   Exercise Review   Progression No  Has not started program yet; med review was 07/03/15 No  Has not started program yet; med review was 07/03/15 Yes  Has not started program yet; med review was 07/03/15 Yes No   Response to Exercise   Blood Pressure (Admit)    108/62 mmHg 116/64 mmHg   Blood Pressure (Exercise)     118/64 mmHg   Blood Pressure (Exit)    112/70 mmHg 108/56 mmHg   Heart Rate (Admit)    76 bpm 76 bpm   Heart Rate (Exercise)    101 bpm 105 bpm   Heart Rate (Exit)    71 bpm 73 bpm   Rating of Perceived Exertion (Exercise)    13 13   Symptoms    None None   Comments    Uyen is getting stronger and is especially encouraged on the treadmill. She can now walk for 20 minutes without stopping. She can walk into exercise class without oxygen now too and she was very proud of that.  Dannette has been out for almost a month and will continue to be out due to illness and medical reasons. No more exercise progression will be made and we will meet with her individually to adjust workloads based on her exercise capacity when she returns    Duration    Progress to 50 minutes of aerobic without signs/symptoms of physical distress Progress to 50 minutes of aerobic without signs/symptoms of physical distress   Intensity    Rest + 30  Rest + 30   Progression   Progression    Continue progressive overload as per policy without signs/symptoms or physical distress. Continue progressive overload as per policy without signs/symptoms or physical distress.   Resistance Training   Training Prescription (read-only)   Yes Yes Yes   Weight (read-only)   2 2 2    Reps (read-only)   10-15 10-15 10-15   Treadmill   MPH (read-only)    2 2   Grade (read-only)    0 0   Minutes (read-only)    20 20   REL-XR   Level (read-only)  3 5 5 5    Watts (read-only)  1 45 17 45   Minutes (read-only)  15 15     09/26/15 0800 10/23/15 1100 11/20/15 1200       Exercise Review   Progression No  Absent since last review; last visit 08/09/15 No  Absent since last review; last visit 08/09/15 No  Absent since last review; last visit 08/09/15     Response to Exercise   Comments Ex. Rx. will be re evaluated upon return due to extended absence Ex. Rx. will be re evaluated upon return due to extended absence Ex. Rx. will be re evaluated upon return due to extended absence     Duration Progress to 50 minutes of aerobic without signs/symptoms of physical distress Progress to 50 minutes of aerobic without signs/symptoms of physical distress Progress to 50 minutes of aerobic without signs/symptoms of physical distress     Intensity Rest + 30 Rest + 30 Rest + 30     Progression   Progression Continue progressive overload as per policy without signs/symptoms or physical distress. Continue progressive overload as per policy without signs/symptoms or physical distress. Continue progressive overload as per policy without signs/symptoms or physical distress.     Resistance Training   Training Prescription (read-only) Yes Yes      Weight (read-only) 2 2      Reps (read-only) 10-15 10-15      Treadmill   MPH (read-only) 2 2      Grade (read-only) 0 0      Minutes (read-only) 20 20      REL-XR   Level (read-only) 5 5      Watts (read-only) 45 45      Minutes  (read-only) 15 15         Exercise Comments:   Discharge Exercise Prescription (Final Exercise Prescription Changes):     Exercise Prescription Changes - 11/20/15 1200    Exercise Review   Progression No  Absent since last review; last visit 08/09/15   Response to Exercise   Comments Ex. Rx. will be re evaluated upon return due to extended absence   Duration Progress to 50 minutes of aerobic without signs/symptoms of physical distress   Intensity Rest + 30   Progression   Progression Continue progressive overload as per policy without signs/symptoms or physical distress.      Nutrition:  Target Goals: Understanding of nutrition guidelines, daily intake of sodium <1544m, cholesterol <205m calories 30% from fat and 7% or less from saturated fats, daily to have 5 or more servings of fruits and vegetables.  Biometrics:     Pre Biometrics - 07/03/15 1431    Pre Biometrics   Height 5' 1"  (1.549 m)   Weight 136 lb 8 oz (61.916 kg)   Waist Circumference 34.5 inches   Hip Circumference 38.5 inches   Waist to Hip Ratio 0.9 %   BMI (Calculated) 25.8       Nutrition Therapy Plan and Nutrition Goals:   Nutrition Discharge: Rate Your Plate Scores:   Nutrition Goals Re-Evaluation:     Nutrition Goals Re-Evaluation      11/27/15 1359           Personal Goal #1 Re-Evaluation   Personal Goal #1 NoLezlias been out since 08/09/2015 due to wound debridement. Noted that NoToniecently had a Irrigation and debridment of her chest wall per another dept EpIC charting.        Goal Progress Seen No          Psychosocial: Target Goals: Acknowledge presence or absence of depression, maximize coping  skills, provide positive support system. Participant is able to verbalize types and ability to use techniques and skills needed for reducing stress and depression.  Initial Review & Psychosocial Screening:     Initial Psych Review & Screening - 07/03/15 1342    Family Dynamics    Good Support System? Yes   Barriers   Psychosocial barriers to participate in program There are no identifiable barriers or psychosocial needs.;The patient should benefit from training in stress management and relaxation.   Screening Interventions   Interventions Encouraged to exercise      Quality of Life Scores:     Quality of Life - 07/03/15 1446    Quality of Life Scores   Health/Function Pre 22.62 %   Socioeconomic Pre 29.58 %   Psych/Spiritual Pre 30 %   Family Pre 30 %   GLOBAL Pre 26.6 %      PHQ-9:     Recent Review Flowsheet Data    Depression screen [facility] 2/9 07/03/2015   Decreased Interest 0   Down, Depressed, Hopeless 0   PHQ - 2 Score 0   Altered sleeping 0   Tired, decreased energy 3   Change in appetite 0   Feeling bad or failure about yourself  0   Trouble concentrating 0   Moving slowly or fidgety/restless 0   Suicidal thoughts 0   PHQ-9 Score 3   Difficult doing work/chores Somewhat difficult      Psychosocial Evaluation and Intervention:     Psychosocial Evaluation - 07/12/15 0936    Psychosocial Evaluation & Interventions   Interventions Stress management education;Encouraged to exercise with the program and follow exercise prescription;Relaxation education   Comments Counselor met with Ms. Wayne today for initial psychosocial evaluation.  It is her first day in Cardiac Rehab and she was energetic and working hard already!  Ms. Rhines is a 81 year old who had open heart surgery in August followed by subsequent procedures and an infection resulting in 5 weeks in the hospital.  She has a strong support system with a spouse of 12 years and several siblings who live closeby.  Ms. Jinkins reports that she sleeps well and typically has a good appetite.  She denies a history  of depression or anxiety or any current symptoms.  She states her mood is generally positive and her doctor has released her to return to "normal activities" with pacing herself.  Ms.  Amspacher has goals in this program to increase her strength and stamina and to improve her balance - as she occasionally has to use a walker.  She will benefit from the consistent exercise aspect of this program.     Continued Psychosocial Services Needed No      Psychosocial Re-Evaluation:     Psychosocial Re-Evaluation      07/24/15 0940 11/13/15 1422 11/27/15 1425       Psychosocial Re-Evaluation   Comments Counselor follow up with Ms. Babula today reporting she has better balance now and is no longer using her walker at home at all.  She brings it to this program just to carry her oxygen tank.  She states she is experiencing more stamina as well and able to walk and move more and for longer periods of time.  She continues to sleep well, eat well and have a positive attitude.  Counselor commended Ms. Rhine for all her hard work and progress made so far.   I left vm for Tayvia since she called on 11/06/2015 to  see if she could return. I read EPIC note from today that mentioned an Infection and Wound debridement so I suggested she return after things get better for her. I called Emillie's home and her husband answered. He said she just got out of [facility] for another debridment of her chest wall.         Vocational Rehabilitation: Provide vocational rehab assistance to qualifying candidates.   Vocational Rehab Evaluation & Intervention:     Vocational Rehab - 07/03/15 1337    Initial Vocational Rehab Evaluation & Intervention   Assessment shows need for Vocational Rehabilitation No      Education: Education Goals: Education classes will be provided on a weekly basis, covering required topics. Participant will state understanding/return demonstration of topics presented.  Learning Barriers/Preferences:     Learning Barriers/Preferences - 07/03/15 1336    Learning Barriers/Preferences   Learning Barriers Hearing   Learning Preferences Video      Education  Topics: General Nutrition Guidelines/Fats and Fiber: -Group instruction provided by verbal, written material, models and posters to present the general guidelines for heart healthy nutrition. Gives an explanation and review of dietary fats and fiber.   Controlling Sodium/Reading Food Labels: -Group verbal and written material supporting the discussion of sodium use in heart healthy nutrition. Review and explanation with models, verbal and written materials for utilization of the food label.   Exercise Physiology & Risk Factors: - Group verbal and written instruction with models to review the exercise physiology of the cardiovascular system and associated critical values. Details cardiovascular disease risk factors and the goals associated with each risk factor.          Cardiac Rehab from 08/09/2015 in [facility] Cardiac and Pulmonary Rehab   Date  07/12/15   Educator  RM   Instruction Review Code  2- meets goals/outcomes      Aerobic Exercise & Resistance Training: - Gives group verbal and written discussion on the health impact of inactivity. On the components of aerobic and resistive training programs and the benefits of this training and how to safely progress through these programs.      Cardiac Rehab from 08/09/2015 in [facility] Cardiac and Pulmonary Rehab   Date  07/17/15   Educator  RM   Instruction Review Code  2- meets goals/outcomes      Flexibility, Balance, General Exercise Guidelines: - Provides group verbal and written instruction on the benefits of flexibility and balance training programs. Provides general exercise guidelines with specific guidelines to those with heart or lung disease. Demonstration and skill practice provided.      Cardiac Rehab from 08/09/2015 in [facility] Cardiac and Pulmonary Rehab   Date  07/24/15   Educator  RM   Instruction Review Code  2- meets goals/outcomes      Stress Management: - Provides group verbal and written instruction about the health  risks of elevated stress, cause of high stress, and healthy ways to reduce stress.      Cardiac Rehab from 08/09/2015 in [facility] Cardiac and Pulmonary Rehab   Date  07/26/15   Educator  [facility]   Instruction Review Code  2- meets goals/outcomes      Depression: - Provides group verbal and written instruction on the correlation between heart/lung disease and depressed mood, treatment options, and the stigmas associated with seeking treatment.   Anatomy & Physiology of the Heart: - Group verbal and written instruction and models provide basic cardiac anatomy and physiology, with the coronary electrical and  arterial systems. Review of: AMI, Angina, Valve disease, Heart Failure, Cardiac Arrhythmia, Pacemakers, and the ICD.   Cardiac Procedures: - Group verbal and written instruction and models to describe the testing methods done to diagnose heart disease. Reviews the outcomes of the test results. Describes the treatment choices: Medical Management, Angioplasty, or Coronary Bypass Surgery.   Cardiac Medications: - Group verbal and written instruction to review commonly prescribed medications for heart disease. Reviews the medication, class of the drug, and side effects. Includes the steps to properly store meds and maintain the prescription regimen.   Go Sex-Intimacy & Heart Disease, Get SMART - Goal Setting: - Group verbal and written instruction through game format to discuss heart disease and the return to sexual intimacy. Provides group verbal and written material to discuss and apply goal setting through the application of the S.M.A.R.T. Method.   Other Matters of the Heart: - Provides group verbal, written materials and models to describe Heart Failure, Angina, Valve Disease, and Diabetes in the realm of heart disease. Includes description of the disease process and treatment options available to the cardiac patient.      Cardiac Rehab from 08/09/2015 in [facility] Cardiac and Pulmonary Rehab    Date  08/09/15   Educator  SB   Instruction Review Code  2- meets goals/outcomes      Exercise & Equipment Safety: - Individual verbal instruction and demonstration of equipment use and safety with use of the equipment.      Cardiac Rehab from 08/09/2015 in [facility] Cardiac and Pulmonary Rehab   Date  07/03/15   Educator  SB   Instruction Review Code  2- meets goals/outcomes      Infection Prevention: - Provides verbal and written material to individual with discussion of infection control including proper hand washing and proper equipment cleaning during exercise session.      Cardiac Rehab from 08/09/2015 in [facility] Cardiac and Pulmonary Rehab   Date  07/03/15   Educator  Sb   Instruction Review Code  2- meets goals/outcomes      Falls Prevention: - Provides verbal and written material to individual with discussion of falls prevention and safety.      Cardiac Rehab from 08/09/2015 in [facility]   Date  07/03/15   Educator  Sb   Instruction Review Code  2- meets goals/outcomes      Diabetes: - Individual verbal and written instruction to review signs/symptoms of diabetes, desired ranges of glucose level fasting, after meals and with exercise. Advice that pre and post exercise glucose checks will be done for 3 sessions at entry of program.    Knowledge Questionnaire Score:     Knowledge Questionnaire Score - 07/03/15 1440    Knowledge Questionnaire Score   Pre Score 26/28      Personal Goals and Risk Factors at Admission:     Personal Goals and Risk Factors at Admission - 07/03/15 1339    Core Components/Risk Factors/Patient Goals on Admission   Sedentary Yes;Sedentary  Balance concerns ,using walker for ambulation.   Intervention (read-only) While in program, learn and follow the exercise prescription taught. Start at a low level workload and increase workload after able to maintain previous level for 30 minutes. Increase time before increasing  intensity.   Intervention (read-only) Provide exercise education and an individualized exercise prescription that will provide continued progressive overload as per policy without signs/symptoms of physical distress.   Diabetes No   Hypertension Yes   Goal Participant  will see blood pressure controlled within the values of 140/56m/Hg or within value directed by their physician.   Intervention (read-only) Provide nutrition & aerobic exercise along with prescribed medications to achieve BP 140/90 or less.   Lipids Yes   Goal Cholesterol controlled with medications as prescribed, with individualized exercise RX and with personalized nutrition plan. Value goals: LDL < 740m HDL > 4016mParticipant states understanding of desired cholesterol values and following prescriptions.   Intervention (read-only) Provide nutrition & aerobic exercise along with prescribed medications to achieve LDL <9m63mDL >40mg68m   Personal Goals and Risk Factors Review:      Goals and Risk Factor Review      07/17/15 1007 08/02/15 1511 09/26/15 0826 10/23/15 1108 11/13/15 1422   Core Components/Risk Factors/Patient Goals Review   Personal Goals Review Increase Aerobic Exercise and Physical Activity Increase Aerobic Exercise and Physical Activity;Hypertension;Lipids      Review     I left vm for Ashyra Azizie she called on 11/06/2015 to see if she could return. I read EPIC note from today that mentioned an Infection and Wound debridement so I suggested she return after things get better for her.   Increase Aerobic Exercise and Physical Activity (read-only)   Goals Progress/Improvement seen   Yes No No    Comments Patient discussed her desire to get back to "normal" and was talking about some of the exercise she used to do before he heart episode. She said that after her first week of class she was a little sore. We discussed that that can be a normal response of the body when a new activity is preformed, and how over time  the soreness could go away once he muscles get stronger and used to the new activity. Education on cross training/ the body's response to new activities was given.  Larsen Mearled today she can bend down and tie her shoes.  Today she was having a flare of her bursitis in her leg. No treadmill time because of the bursitis pain.  She is walking more at home and her husband bought her a cane to help her stay balanced. We talked about the distance it is to walk from the hosptal entrance to the rehab gym. Her husband wheels her down. Her goal is to be able to walk from the class to the front door without wheelchair assistance by Dec 19th.  Ex. Rx. will be re-evaluated upon return due to extende absence Ex. Rx. 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 She will see RD in Dec on the 19th to review nutrition plan.       Abnormal Lipids (read-only)   Progress seen towards goals  Unknown      Comments  No labs to review. Bo Gavrielle have an appointment with the RD in December.         11/27/15 1401 11/27/15 1424         Core Components/Risk Factors/Patient Goals Review   Personal Goals Review Sedentary Sedentary      Review Olyvia Christynbeen out since 08/09/2015 due to wound debridement. Noted that Mckala Enesntly had a Irrigation and debridment of her chest wall per another dept EpIC charting.  I called Shaconda's home and her husband answered. [facility].       Expected Outcomes Clear of infection to be able to exercise.           Personal Goals Discharge (Final Personal Goals and Risk Factors Review):      Goals and Risk Factor Review - 11/27/15 1424    Core Components/Risk Factors/Patient Goals Review   Personal Goals Review Sedentary   Review I called Kamoria's home and her husband answered. He said she just got  out of [facility] for another debridment of her chest wall.       ITP Comments:     ITP Comments      08/14/15 1211 09/10/15 1215 10/05/15 1131 10/31/15 0751 11/13/15 1422   ITP Comments 30 day review preparation: Continue with ITP. Ready for 30 day review.  Continue with ITP  Remains out with medical concerns.  Has appoinment mid JAn with cardiologist regarding pacemaker qustions. Ready for 30 day review. Continue with ITP. Remains out with medical reasons 30 day review.  Continue with ITP. Continues to remain out for medical reasons I left vm for Jenese since she called on 11/06/2015 to see if she could return. I read EPIC note from today that mentioned an Infection and Wound debridement so I suggested she return after things get better for her.      Comments:

## 2015-11-27 NOTE — Progress Notes (Signed)
Pt transitioned to home wound vac. Educated patient and her husband on how to change wound vac cannister & connections.   Fritz Pickerel, RN

## 2015-11-27 NOTE — Progress Notes (Addendum)
      [address]       [place] [address]             [phone]        1 Days Post-Op Procedure(s) (LRB): IRRIGATION AND DEBRIDEMENT WOUND (N/A) APPLICATION OF WOUND VAC (N/A) APPLICATION OF A-CELL OF CHEST/ABDOMEN (N/A)  Subjective: Patient hopes VAC comes today.  Objective: Vital signs in last 24 hours: Temp:  [97.5 F (36.4 C)-97.9 F (36.6 C)] 97.5 F (36.4 C) (03/20 0526) Pulse Rate:  [62-69] 62 (03/20 0526) Cardiac Rhythm:  [-] Heart block (03/19 1939) Resp:  [17] 17 (03/20 0526) BP: (136-137)/(42-47) 136/42 mmHg (03/20 0526) SpO2:  [96 %-98 %] 96 % (03/20 0526) Weight:  [137 lb (62.143 kg)] 137 lb (62.143 kg) (03/20 0526)   Current Weight  11/27/15 137 lb (62.143 kg)      Intake/Output from previous day: 03/19 0701 - 03/20 0700 In: -  Out: 1132 [Urine:1100; Drains:32]   Physical Exam:  Cardiovascular: Paced Pulmonary: Mostly clear Abdomen: Soft, non tender, bowel sounds present. Extremities: No LE edema Wound: VAC and Acell in place  Lab Results: CBC: No results for input(s): WBC, HGB, HCT, PLT in the last 72 hours. BMET:   Recent Labs  11/25/15 0533  NA 142  K 4.3  CL 105  CO2 27  GLUCOSE 146*  BUN 11  CREATININE 0.67  CALCIUM 9.3    PT/INR:  Lab Results  Component Value Date   INR 1.14 11/27/2015   INR 1.16 11/26/2015   INR 1.17 11/25/2015   ABG:  INR: Will add last result for INR, ABG once components are confirmed Will add last 4 CBG results once components are confirmed  Assessment/Plan:  1. CV - Paced in the 60's. On Cozaar 100 mg daily, Bisoprolol 10 mg daily, and Coumadin. INR 1.14. Will discuss if should remain on 2.5 with Dr. Roxy Manns as INR staying under 1.2. 2.  Acute blood loss anemia - Last H and H up to 9.7 and 31 3. Per Dr. Marla Roe, she may be discharged once [facility] arrives. Acell again in next 2-3 weeks. HH to change VAC next Wednesday.  4. Remove PICC line  ZIMMERMAN,DONIELLE  MPA-C 11/27/2015,8:04 AM   I have seen and examined the patient and agree with the assessment and plan as outlined.  Continue pre-op dose of warfarin and let the INR drift up slowly.  Ready for d/c home - awaiting home [place], MD 11/27/2015 9:03 AM

## 2015-11-27 NOTE — Progress Notes (Signed)
Discussed with the patient and her husband and all questioned fully answered. She will call me if any problems arise.  Patient sent on home wound vac. Pt and her husband educated on how to change the cannister & connections. Pt and her husband educated on how to empty JP drains.  Fritz Pickerel, RN

## 2015-11-27 NOTE — Care Management Note (Signed)
Case Management Note Previous CM note initiated by Loyola Mast RN, CM  Patient Details  Name: TERASHA ZIPAY MRN: [id number] Date of Birth: 1935-02-06  Subjective/Objective:  81 y.o. F s/p MVR with Bioprosthetic Valve, CABG x 1, Maze Procedure Tricuspid Valve Replacement. Will be receiving HHRN and IV abx via [place], per pt, at discharge. pt is expected to remain in hospital until after Flap surgery next week as there is concern for infection per Infectious Disease. Pt has Wound Vac. ID to follow up on 11/20/2015.                   Action/Plan: Pam with [facility] is following for infusion therapy. Notified Manuela Schwartz of  Anticipated [place] need.   Expected Discharge Date:    11/27/15              Expected Discharge Plan:  [place]  In-House Referral:     Discharge planning Services  CM Consult  Post Acute Care Choice:  Home Health, Durable Medical Equipment Choice offered to:  Patient  DME Arranged:  Vac DME Agency:  [facility]  HH Arranged:  RN, PT, OT Clements Agency:  [place]  Status of Service:  Completed, signed off  Medicare Important Message Given:  Yes Date Medicare IM Given:    Medicare IM give by:    Date Additional Medicare IM Given:    Additional Medicare Important Message give by:     If discussed at [place] of Stay Meetings, dates discussed: 11/16/2015 , 11/21/15, 11/23/15  Discharge Disposition: home [facility]   Additional Comments:  11/27/15- 1000 - [name] RN, BSN- pt for d/c home today with [facility] wound VAC- spoke with Rickie at [facility]- estimated delivery time 11am, pt has finished her iv abx course and will not need home iv abx- checked with Josie Saunders PA- and will have [place] on Wed. 3/22 along with wound VAC drsg change- call made to Manuela Schwartz with [facility] regarding d/c today and HH needs.   11/24/15- 1600- [name] RN, BSN- per Hortencia Pilar with [facility]- pt will have wound VAC approval and plan will be for delivery on Monday 3/20.  11/23/15-  1500- [name] RN, BSN - following pt for d/c needs- referral received for home wound VAC- [facility] form placed on shadow chart for signature- spoke with DR. Dillingham- who states pt will go home on traditional wound VAC not Prevana- [facility] form has been signed- also per Dr. Marla Roe she states that per her stand point pt could go home on po abx- but this would need to be cleared by ID- vs finishing IV course through 3/18- MD please advise if pt can go home on po abx vs needing to finish IV abx- if IV abx needed it would make since to finish here then d/c home after last dose on 3/18 with [facility]- - will need orders for HHRN/PT/OT- per Dr. Marla Roe will need wound VAC change next week then the following week drsg change twice. Call made to Perry with [facility] regarding need for home wound VAC and order form faxed.   11/21/15- 1530- [name] RN, BSN- pt returned today to OR for further [place] ,APPLICATION OF WOUND VAC and  APPLICATION OF A-CELL OF CHEST/ABDOMEN-- CM following for d/c needs- [facility] referral has been made for potential IV abx at discharge- ?need for wound VAC at discharge- MD please advise if pt will go home with wound VAC so  process can be started for approval on home wound VAC.    [name], RN 11/27/2015, 10:02 AM

## 2015-11-27 NOTE — Care Management Important Message (Signed)
Important Message  Patient Details  Name: [name] MRN: [id number] Date of Birth: March 27, 1935   Medicare Important Message Given:  Yes    Tyrone Pautsch P Chloe Baig 11/27/2015, 3:25 PM

## 2015-11-28 ENCOUNTER — Telehealth: Payer: Self-pay | Admitting: Cardiovascular Disease

## 2015-11-28 ENCOUNTER — Encounter (HOSPITAL_COMMUNITY): Payer: Self-pay | Admitting: Plastic Surgery

## 2015-11-28 NOTE — Telephone Encounter (Signed)
Pt husband called, states that pt was in the hospital for 17 days. States pt had an infection, has had to have a Psychiatric nurse and pt currently has a wound vac. He moved pt appt from Friday to 4/27. Pt wanted Dr. Fletcher Anon to know this. Also, would like a call back. States the home health nurse is going to be checking pt coumadin.

## 2015-11-29 ENCOUNTER — Ambulatory Visit (INDEPENDENT_AMBULATORY_CARE_PROVIDER_SITE_OTHER): Payer: Medicare Other | Admitting: Cardiovascular Disease

## 2015-11-29 DIAGNOSIS — I340 Nonrheumatic mitral (valve) insufficiency: Secondary | ICD-10-CM

## 2015-11-29 DIAGNOSIS — Z953 Presence of xenogenic heart valve: Secondary | ICD-10-CM

## 2015-11-29 DIAGNOSIS — I4891 Unspecified atrial fibrillation: Secondary | ICD-10-CM

## 2015-11-29 DIAGNOSIS — T8189XA Other complications of procedures, not elsewhere classified, initial encounter: Secondary | ICD-10-CM | POA: Diagnosis not present

## 2015-11-29 LAB — POCT INR: INR: 1.2

## 2015-11-30 ENCOUNTER — Encounter: Payer: Self-pay | Admitting: *Deleted

## 2015-11-30 DIAGNOSIS — Z952 Presence of prosthetic heart valve: Secondary | ICD-10-CM

## 2015-11-30 DIAGNOSIS — Z951 Presence of aortocoronary bypass graft: Secondary | ICD-10-CM

## 2015-11-30 NOTE — Progress Notes (Signed)
Cardiac Individual Treatment Plan  Patient Details  Name: [name] MRN: [id number] Date of Birth: 04-06-1935 Referring Provider:  Wellington Hampshire, MD  Initial Encounter Date:       Cardiac Rehab from 07/03/2015 in [facility] Cardiac and Pulmonary Rehab   Date  07/03/15      Visit Diagnosis: S/P MVR (mitral valve replacement)  S/P CABG x 1  Patient's Home Medications on Admission:  Current outpatient prescriptions:  .  acetaminophen (TYLENOL) 325 MG tablet, Take 2 tablets (650 mg total) by mouth every 4 (four) hours as needed for mild pain (or Fever >/= 101)., Disp: , Rfl:  .  aspirin EC 81 MG EC tablet, Take 1 tablet (81 mg total) by mouth daily., Disp: , Rfl:  .  benzonatate (TESSALON) 100 MG capsule, Take 100 mg by mouth 3 (three) times daily as needed for cough (for cold). , Disp: , Rfl:  .  bisoprolol (ZEBETA) 10 MG tablet, Take 1 tablet (10 mg total) by mouth daily., Disp: 30 tablet, Rfl: 5 .  fluticasone (FLONASE) 50 MCG/ACT nasal spray, Place 1 spray into both nostrils daily as needed for allergies. , Disp: , Rfl:  .  furosemide (LASIX) 20 MG tablet, Take 20 mg by mouth 2 (two) times daily. , Disp: , Rfl:  .  gabapentin (NEURONTIN) 100 MG capsule, Take 100 mg by mouth 2 (two) times daily. , Disp: , Rfl: 1 .  ipratropium-albuterol (DUONEB) 0.5-2.5 (3) MG/3ML SOLN, Take 3 mLs by nebulization every 6 (six) hours as needed (for shortness of breath)., Disp: 360 mL, Rfl: 10 .  levothyroxine (SYNTHROID, LEVOTHROID) 88 MCG tablet, Take 88 mcg by mouth daily before breakfast., Disp: , Rfl:  .  losartan (COZAAR) 100 MG tablet, Take 1 tablet (100 mg total) by mouth daily., Disp: 30 tablet, Rfl: 3 .  pantoprazole (PROTONIX) 40 MG tablet, Take 40 mg by mouth 2 (two) times daily. , Disp: , Rfl:  .  potassium chloride SA (K-DUR,KLOR-CON) 10 MEQ tablet, Take 1 tablet (10 mEq total) by mouth daily., Disp: 30 tablet, Rfl: 1 .  simvastatin (ZOCOR) 20 MG tablet, Take 20 mg by mouth daily., Disp: ,  Rfl:  .  spironolactone (ALDACTONE) 25 MG tablet, Take 1 tablet (25 mg total) by mouth daily., Disp: 30 tablet, Rfl: 6 .  traMADol (ULTRAM) 50 MG tablet, Take 1-2 tablets (50-100 mg total) by mouth every 6 (six) hours as needed (mild pain)., Disp: 30 tablet, Rfl: 0 .  warfarin (COUMADIN) 2.5 MG tablet, TAKE 1 TABLET BY MOUTH AS DIRECTED (Patient taking differently: TAKE 1 TABLET BY MOUTH DAILY EXCEPT MONDAYS. PT TAKES 1.'25MG'$  ON MONDAYS), Disp: 30 tablet, Rfl: 2  Past Medical History: Past Medical History  Diagnosis Date  . Hypertension   . COPD (chronic obstructive pulmonary disease) ([place])   . Hyperlipidemia   . Meniere disease   . PAF (paroxysmal atrial fibrillation) ([place])   . COPD (chronic obstructive pulmonary disease) ([place])   . Hypothyroidism   . Pulmonary hypertension ([place])   . Chronic diastolic CHF (congestive heart failure) ([place])   . PONV (postoperative nausea and vomiting)   . Anxiety   . GERD (gastroesophageal reflux disease)   . Severe Mitral Regurgitation s/p MV Repair     a. 04/2015 s/p 27 mm [facility] Mitral bovine bioprosthetic tissue valve  . Tricuspid Regurgitation s/p Repair     a. 04/2015 s/p 26 mm Edwards mc3 ring annuloplasty  . Maze operation for AF w/ LAA clipping  a. 04/2015 Complete bilateral atrial lesion set using cryothermy and bipolar radiofrequency ablation with clipping of LA appendage  . CAD s/p CABG x 1     a. 04/2015 LIMA to LAD  . Post-surgical complete heart block, symptomatic     a. 04/2015 s/p MDT [id number] Advisa DR MRI DC PPM.  . Wound infection ([place]) 10/13/2015    Superficial sternal wound infection  . Cancer of right breast ([place]) 08/09/2001  . Presence of permanent cardiac pacemaker   . On home oxygen therapy     "2L at night" (11/10/2015)  . Pneumonia ~ 2010  . Arthritis     "hands" (11/10/2015)  . Surgical wound, non healing - chest wall 11/10/2015    Tobacco Use: History  Smoking status  . Former Smoker -- 1.50 packs/day for 40 years  .  Types: Cigarettes  . Quit date: 04/20/1998  Smokeless tobacco  . Never Used    Labs: Recent Review Flowsheet Data    Labs for ITP Cardiac and Pulmonary Rehab Latest Ref Rng 04/28/2015 04/29/2015 04/30/2015 11/13/2015 11/14/2015   PHART 7.350 - 7.450 - - - 7.428 7.420   PCO2ART 35.0 - 45.0 mmHg - - - 41.1 49.4(H)   HCO3 20.0 - 24.0 mEq/L - - - 26.7(H) 31.5(H)   TCO2 0 - 100 mmol/L - 34 34 27.9 33.0   O2SAT - 65.3 61.7 - 95.2 98.4       Exercise Target Goals:    Exercise Program Goal: Individual exercise prescription set with THRR, safety & activity barriers. Participant demonstrates ability to understand and report RPE using BORG scale, to self-measure pulse accurately, and to acknowledge the importance of the exercise prescription.  Exercise Prescription Goal: Starting with aerobic activity 30 plus minutes a day, 3 days per week for initial exercise prescription. Provide home exercise prescription and guidelines that participant acknowledges understanding prior to discharge.  Activity Barriers & Risk Stratification:     Activity Barriers & Cardiac Risk Stratification - 07/03/15 1335    Activity Barriers & Cardiac Risk Stratification   Activity Barriers Balance Concerns;Deconditioning;Joint Problems  Meineres disease= balance concerns, bursitis left hip and leg, sternal open wound   Cardiac Risk Stratification Moderate      6 Minute Walk:     6 Minute Walk      07/03/15 1424       6 Minute Walk   Phase Initial     Distance 450 feet  NS Test: 450 steps     Walk Time 6 minutes     RPE 11     Symptoms No     Resting HR 69 bpm     Resting BP 132/80 mmHg     Max Ex. HR 84 bpm     Max Ex. BP 138/74 mmHg        Initial Exercise Prescription:     Initial Exercise Prescription - 07/03/15 1400    Date of Initial Exercise Prescription   Date 07/03/15   Bike   Level 0.5   Minutes 15   Recumbant Bike   Level 2   RPM 30   Watts 20   Minutes 15   NuStep   Level 2    Watts 30   Minutes 15   Cybex   Level 1   RPM 30   Minutes 15   Recumbant Elliptical   Level 1   Watts 20   Minutes 15   REL-XR   Level 2   Watts 30  Minutes 15   Prescription Details   Frequency (times per week) 3   Duration Progress to 30 minutes of continuous aerobic without signs/symptoms of physical distress   Intensity   THRR REST +  30   Progression   Progression Continue progressive overload as per policy without signs/symptoms or physical distress.  no upper body exercise due to sternal wound precautions   Resistance Training   Training Prescription No  ROM only due to sternal wound precautions      Perform Capillary Blood Glucose checks as needed.  Exercise Prescription Changes:     Exercise Prescription Changes      07/11/15 0600 07/21/15 0900 07/26/15 0800 08/07/15 1500 08/09/15 0750   Exercise Review   Progression No  Has not started program yet; med review was 07/03/15 No  Has not started program yet; med review was 07/03/15 Yes  Has not started program yet; med review was 07/03/15 Yes No   Response to Exercise   Blood Pressure (Admit)    108/62 mmHg 116/64 mmHg   Blood Pressure (Exercise)     118/64 mmHg   Blood Pressure (Exit)    112/70 mmHg 108/56 mmHg   Heart Rate (Admit)    76 bpm 76 bpm   Heart Rate (Exercise)    101 bpm 105 bpm   Heart Rate (Exit)    71 bpm 73 bpm   Rating of Perceived Exertion (Exercise)    13 13   Symptoms    None None   Comments    Jillana is getting stronger and is especially encouraged on the treadmill. She can now walk for 20 minutes without stopping. She can walk into exercise class without oxygen now too and she was very proud of that.  Mikki has been out for almost a month and will continue to be out due to illness and medical reasons. No more exercise progression will be made and we will meet with her individually to adjust workloads based on her exercise capacity when she returns    Duration    Progress to 50 minutes of  aerobic without signs/symptoms of physical distress Progress to 50 minutes of aerobic without signs/symptoms of physical distress   Intensity    Rest + 30 Rest + 30   Progression   Progression    Continue progressive overload as per policy without signs/symptoms or physical distress. Continue progressive overload as per policy without signs/symptoms or physical distress.   Resistance Training   Training Prescription (read-only)   Yes Yes Yes   Weight (read-only)   2 2 2    Reps (read-only)   10-15 10-15 10-15   Treadmill   MPH (read-only)    2 2   Grade (read-only)    0 0   Minutes (read-only)    20 20   REL-XR   Level (read-only)  3 5 5 5    Watts (read-only)  45 71 45 34   Minutes (read-only)    15 15     09/26/15 0800 10/23/15 1100 11/20/15 1200       Exercise Review   Progression No  Absent since last review; last visit 08/09/15 No  Absent since last review; last visit 08/09/15 No  Absent since last review; last visit 08/09/15     Response to Exercise   Comments Ex. Rx. will be re evaluated upon return due to extended absence Ex. Rx. will be re evaluated upon return due to extended absence Ex. Rx. will be re evaluated  upon return due to extended absence     Duration Progress to 50 minutes of aerobic without signs/symptoms of physical distress Progress to 50 minutes of aerobic without signs/symptoms of physical distress Progress to 50 minutes of aerobic without signs/symptoms of physical distress     Intensity Rest + 30 Rest + 30 Rest + 30     Progression   Progression Continue progressive overload as per policy without signs/symptoms or physical distress. Continue progressive overload as per policy without signs/symptoms or physical distress. Continue progressive overload as per policy without signs/symptoms or physical distress.     Resistance Training   Training Prescription (read-only) Yes Yes      Weight (read-only) 2 2      Reps (read-only) 10-15 10-15      Treadmill   MPH  (read-only) 2 2      Grade (read-only) 0 0      Minutes (read-only) 20 20      REL-XR   Level (read-only) 5 5      Watts (read-only) 45 45      Minutes (read-only) 15 15         Exercise Comments:   Discharge Exercise Prescription (Final Exercise Prescription Changes):     Exercise Prescription Changes - 11/20/15 1200    Exercise Review   Progression No  Absent since last review; last visit 08/09/15   Response to Exercise   Comments Ex. Rx. will be re evaluated upon return due to extended absence   Duration Progress to 50 minutes of aerobic without signs/symptoms of physical distress   Intensity Rest + 30   Progression   Progression Continue progressive overload as per policy without signs/symptoms or physical distress.      Nutrition:  Target Goals: Understanding of nutrition guidelines, daily intake of sodium <1544m, cholesterol <2053m calories 30% from fat and 7% or less from saturated fats, daily to have 5 or more servings of fruits and vegetables.  Biometrics:     Pre Biometrics - 07/03/15 1431    Pre Biometrics   Height 5' 1"  (1.549 m)   Weight 136 lb 8 oz (61.916 kg)   Waist Circumference 34.5 inches   Hip Circumference 38.5 inches   Waist to Hip Ratio 0.9 %   BMI (Calculated) 25.8       Nutrition Therapy Plan and Nutrition Goals:   Nutrition Discharge: Rate Your Plate Scores:   Nutrition Goals Re-Evaluation:     Nutrition Goals Re-Evaluation      11/27/15 1359           Personal Goal #1 Re-Evaluation   Personal Goal #1 NoMaricelaas been out since 08/09/2015 due to wound debridement. Noted that NoAnairaecently had a Irrigation and debridment of her chest wall per another dept EpIC charting.        Goal Progress Seen No          Psychosocial: Target Goals: Acknowledge presence or absence of depression, maximize coping skills, provide positive support system. Participant is able to verbalize types and ability to use techniques and skills needed  for reducing stress and depression.  Initial Review & Psychosocial Screening:     Initial Psych Review & Screening - 07/03/15 1342    Family Dynamics   Good Support System? Yes   Barriers   Psychosocial barriers to participate in program There are no identifiable barriers or psychosocial needs.;The patient should benefit from training in stress management and relaxation.   Screening Interventions   Interventions  Encouraged to exercise      Quality of Life Scores:     Quality of Life - 07/03/15 1446    Quality of Life Scores   Health/Function Pre 22.62 %   Socioeconomic Pre 29.58 %   Psych/Spiritual Pre 30 %   Family Pre 30 %   GLOBAL Pre 26.6 %      PHQ-9:     Recent Review Flowsheet Data    Depression screen [facility] 2/9 07/03/2015   Decreased Interest 0   Down, Depressed, Hopeless 0   PHQ - 2 Score 0   Altered sleeping 0   Tired, decreased energy 3   Change in appetite 0   Feeling bad or failure about yourself  0   Trouble concentrating 0   Moving slowly or fidgety/restless 0   Suicidal thoughts 0   PHQ-9 Score 3   Difficult doing work/chores Somewhat difficult      Psychosocial Evaluation and Intervention:     Psychosocial Evaluation - 07/12/15 0936    Psychosocial Evaluation & Interventions   Interventions Stress management education;Encouraged to exercise with the program and follow exercise prescription;Relaxation education   Comments Counselor met with Ms. Sabol today for initial psychosocial evaluation.  It is her first day in Cardiac Rehab and she was energetic and working hard already!  Ms. Volante is a 81 year old who had open heart surgery in August followed by subsequent procedures and an infection resulting in 5 weeks in the hospital.  She has a strong support system with a spouse of 86 years and several siblings who live closeby.  Ms. Kamphuis reports that she sleeps well and typically has a good appetite.  She denies a history  of depression or anxiety  or any current symptoms.  She states her mood is generally positive and her doctor has released her to return to "normal activities" with pacing herself.  Ms. Vandivier has goals in this program to increase her strength and stamina and to improve her balance - as she occasionally has to use a walker.  She will benefit from the consistent exercise aspect of this program.     Continued Psychosocial Services Needed No      Psychosocial Re-Evaluation:     Psychosocial Re-Evaluation      07/24/15 0940 11/13/15 1422 11/27/15 1425       Psychosocial Re-Evaluation   Comments Counselor follow up with Ms. Stampley today reporting she has better balance now and is no longer using her walker at home at all.  She brings it to this program just to carry her oxygen tank.  She states she is experiencing more stamina as well and able to walk and move more and for longer periods of time.  She continues to sleep well, eat well and have a positive attitude.  Counselor commended Ms. Dastrup for all her hard work and progress made so far.   I left vm for Reesha since she called on 11/06/2015 to see if she could return. I read EPIC note from today that mentioned an Infection and Wound debridement so I suggested she return after things get better for her. I called Melonie's home and her husband answered. He said she just got out of [facility] for another debridment of her chest wall.         Vocational Rehabilitation: Provide vocational rehab assistance to qualifying candidates.   Vocational Rehab Evaluation & Intervention:     Vocational Rehab - 07/03/15 1337  Initial Vocational Rehab Evaluation & Intervention   Assessment shows need for Vocational Rehabilitation No      Education: Education Goals: Education classes will be provided on a weekly basis, covering required topics. Participant will state understanding/return demonstration of topics presented.  Learning Barriers/Preferences:     Learning  Barriers/Preferences - 07/03/15 1336    Learning Barriers/Preferences   Learning Barriers Hearing   Learning Preferences Video      Education Topics: General Nutrition Guidelines/Fats and Fiber: -Group instruction provided by verbal, written material, models and posters to present the general guidelines for heart healthy nutrition. Gives an explanation and review of dietary fats and fiber.   Controlling Sodium/Reading Food Labels: -Group verbal and written material supporting the discussion of sodium use in heart healthy nutrition. Review and explanation with models, verbal and written materials for utilization of the food label.   Exercise Physiology & Risk Factors: - Group verbal and written instruction with models to review the exercise physiology of the cardiovascular system and associated critical values. Details cardiovascular disease risk factors and the goals associated with each risk factor.          Cardiac Rehab from 08/09/2015 in [facility] Cardiac and Pulmonary Rehab   Date  07/12/15   Educator  RM   Instruction Review Code  2- meets goals/outcomes      Aerobic Exercise & Resistance Training: - Gives group verbal and written discussion on the health impact of inactivity. On the components of aerobic and resistive training programs and the benefits of this training and how to safely progress through these programs.      Cardiac Rehab from 08/09/2015 in [facility] Cardiac and Pulmonary Rehab   Date  07/17/15   Educator  RM   Instruction Review Code  2- meets goals/outcomes      Flexibility, Balance, General Exercise Guidelines: - Provides group verbal and written instruction on the benefits of flexibility and balance training programs. Provides general exercise guidelines with specific guidelines to those with heart or lung disease. Demonstration and skill practice provided.      Cardiac Rehab from 08/09/2015 in [facility] Cardiac and Pulmonary Rehab   Date  07/24/15   Educator  RM    Instruction Review Code  2- meets goals/outcomes      Stress Management: - Provides group verbal and written instruction about the health risks of elevated stress, cause of high stress, and healthy ways to reduce stress.      Cardiac Rehab from 08/09/2015 in [facility] Cardiac and Pulmonary Rehab   Date  07/26/15   Educator  [facility]   Instruction Review Code  2- meets goals/outcomes      Depression: - Provides group verbal and written instruction on the correlation between heart/lung disease and depressed mood, treatment options, and the stigmas associated with seeking treatment.   Anatomy & Physiology of the Heart: - Group verbal and written instruction and models provide basic cardiac anatomy and physiology, with the coronary electrical and arterial systems. Review of: AMI, Angina, Valve disease, Heart Failure, Cardiac Arrhythmia, Pacemakers, and the ICD.   Cardiac Procedures: - Group verbal and written instruction and models to describe the testing methods done to diagnose heart disease. Reviews the outcomes of the test results. Describes the treatment choices: Medical Management, Angioplasty, or Coronary Bypass Surgery.   Cardiac Medications: - Group verbal and written instruction to review commonly prescribed medications for heart disease. Reviews the medication, class of the drug, and side effects. Includes the steps to properly store  meds and maintain the prescription regimen.   Go Sex-Intimacy & Heart Disease, Get SMART - Goal Setting: - Group verbal and written instruction through game format to discuss heart disease and the return to sexual intimacy. Provides group verbal and written material to discuss and apply goal setting through the application of the S.M.A.R.T. Method.   Other Matters of the Heart: - Provides group verbal, written materials and models to describe Heart Failure, Angina, Valve Disease, and Diabetes in the realm of heart disease. Includes description of the  disease process and treatment options available to the cardiac patient.      Cardiac Rehab from 08/09/2015 in [facility] Cardiac and Pulmonary Rehab   Date  08/09/15   Educator  SB   Instruction Review Code  2- meets goals/outcomes      Exercise & Equipment Safety: - Individual verbal instruction and demonstration of equipment use and safety with use of the equipment.      Cardiac Rehab from 08/09/2015 in [facility] Cardiac and Pulmonary Rehab   Date  07/03/15   Educator  SB   Instruction Review Code  2- meets goals/outcomes      Infection Prevention: - Provides verbal and written material to individual with discussion of infection control including proper hand washing and proper equipment cleaning during exercise session.      Cardiac Rehab from 08/09/2015 in [facility] Cardiac and Pulmonary Rehab   Date  07/03/15   Educator  Sb   Instruction Review Code  2- meets goals/outcomes      Falls Prevention: - Provides verbal and written material to individual with discussion of falls prevention and safety.      Cardiac Rehab from 08/09/2015 in [facility] Cardiac and Pulmonary Rehab   Date  07/03/15   Educator  Sb   Instruction Review Code  2- meets goals/outcomes      Diabetes: - Individual verbal and written instruction to review signs/symptoms of diabetes, desired ranges of glucose level fasting, after meals and with exercise. Advice that pre and post exercise glucose checks will be done for 3 sessions at entry of program.    Knowledge Questionnaire Score:     Knowledge Questionnaire Score - 07/03/15 1440    Knowledge Questionnaire Score   Pre Score 26/28      Personal Goals and Risk Factors at Admission:     Personal Goals and Risk Factors at Admission - 07/03/15 1339    Core Components/Risk Factors/Patient Goals on Admission   Sedentary Yes;Sedentary  Balance concerns ,using walker for ambulation.   Intervention (read-only) While in program, learn and follow the exercise prescription  taught. Start at a low level workload and increase workload after able to maintain previous level for 30 minutes. Increase time before increasing intensity.   Intervention (read-only) Provide exercise education and an individualized exercise prescription that will provide continued progressive overload as per policy without signs/symptoms of physical distress.   Diabetes No   Hypertension Yes   Goal Participant will see blood pressure controlled within the values of 140/109m/Hg or within value directed by their physician.   Intervention (read-only) Provide nutrition & aerobic exercise along with prescribed medications to achieve BP 140/90 or less.   Lipids Yes   Goal Cholesterol controlled with medications as prescribed, with individualized exercise RX and with personalized nutrition plan. Value goals: LDL < 767m HDL > 4051mParticipant states understanding of desired cholesterol values and following prescriptions.   Intervention (read-only) Provide nutrition & aerobic exercise along with prescribed medications to  achieve LDL <64m, HDL >416m      Personal Goals and Risk Factors Review:      Goals and Risk Factor Review      07/17/15 1007 08/02/15 1511 09/26/15 0826 10/23/15 1108 11/13/15 1422   Core Components/Risk Factors/Patient Goals Review   Personal Goals Review Increase Aerobic Exercise and Physical Activity Increase Aerobic Exercise and Physical Activity;Hypertension;Lipids      Review     I left vm for NoAmaliaince she called on 11/06/2015 to see if she could return. I read EPIC note from today that mentioned an Infection and Wound debridement so I suggested she return after things get better for her.   Increase Aerobic Exercise and Physical Activity (read-only)   Goals Progress/Improvement seen   Yes No No    Comments Patient discussed her desire to get back to "normal" and was talking about some of the exercise she used to do before he heart episode. She said that after her first week  of class she was a little sore. We discussed that that can be a normal response of the body when a new activity is preformed, and how over time the soreness could go away once he muscles get stronger and used to the new activity. Education on cross training/ the body's response to new activities was given.  NoRiccitated today she can bend down and tie her shoes.  Today she was having a flare of her bursitis in her leg. No treadmill time because of the bursitis pain.  She is walking more at home and her husband bought her a cane to help her stay balanced. We talked about the distance it is to walk from the hosptal entrance to the rehab gym. Her husband wheels her down. Her goal is to be able to walk from the class to the front door without wheelchair assistance by Dec 19th.  Ex. Rx. will be re-evaluated upon return due to extende absence Ex. Rx. will be re-evaluated upon return due to extende absence    Hypertension (read-only)   Progress seen toward goals  Yes      Comments  Lorriane's BP remains in normal range. She will see RD in Dec on the 19th to review nutrition plan.       Abnormal Lipids (read-only)   Progress seen towards goals  Unknown      Comments  No labs to review. NoYarisill have an appointment with the RD in December.         11/27/15 1401 11/27/15 1424         Core Components/Risk Factors/Patient Goals Review   Personal Goals Review Sedentary Sedentary      Review NoAjnaas been out since 08/09/2015 due to wound debridement. Noted that NoLunaecently had a Irrigation and debridment of her chest wall per another dept EpIC charting.  I called Hildred's home and her husband answered. [facility].       Expected Outcomes Clear of infection to be able to exercise.           Personal Goals Discharge (Final Personal Goals and Risk Factors Review):      Goals and Risk Factor Review - 11/27/15 1424    Core Components/Risk  Factors/Patient Goals Review   Personal Goals Review Sedentary   Review I called Lakeysha's home and her husband answered. [facility].       ITP Comments:     ITP Comments      08/14/15 1211 09/10/15 1215 10/05/15 1131 10/31/15 0751 11/13/15 1422   ITP Comments 30 day review preparation: Continue with ITP. Ready for 30 day review.  Continue with ITP  Remains out with medical concerns.  Has appoinment mid JAn with cardiologist regarding pacemaker qustions. Ready for 30 day review. Continue with ITP. Remains out with medical reasons 30 day review.  Continue with ITP. Continues to remain out for medical reasons I left vm for Benita since she called on 11/06/2015 to see if she could return. I read EPIC note from today that mentioned an Infection and Wound debridement so I suggested she return after things get better for her.     11/30/15 1244           ITP Comments 30 Day Review. Continue with the ITP.          Comments:

## 2015-12-01 ENCOUNTER — Ambulatory Visit: Payer: Medicare PPO | Admitting: Cardiovascular Disease

## 2015-12-04 ENCOUNTER — Ambulatory Visit (INDEPENDENT_AMBULATORY_CARE_PROVIDER_SITE_OTHER): Payer: Medicare Other | Admitting: Cardiology

## 2015-12-04 DIAGNOSIS — Z953 Presence of xenogenic heart valve: Secondary | ICD-10-CM

## 2015-12-04 DIAGNOSIS — I340 Nonrheumatic mitral (valve) insufficiency: Secondary | ICD-10-CM

## 2015-12-04 DIAGNOSIS — I4891 Unspecified atrial fibrillation: Secondary | ICD-10-CM

## 2015-12-04 LAB — POCT INR: INR: 1.7

## 2015-12-05 ENCOUNTER — Encounter (HOSPITAL_COMMUNITY): Payer: Self-pay | Admitting: *Deleted

## 2015-12-05 NOTE — Progress Notes (Signed)
Anesthesia Chart Review:  Pt is a 81 year old female scheduled for irrigation and debridement R chest wall wound with acell placement and vac on 12/06/2015 with Dr. Marla Roe.   Pt is a same day work up.   PMH includes:  CAD (s/p CABG 04/2015: LIMA-LAD), post-surgical complete heart block, pacemaker, PAF, s/p MAZE procedure, diastolic CHF, pulmonary HTN, COPD, HTN, hyperlipidemia, hypothyroidism, mitral regurgitation (s/p MVR 04/2015), tricuspid regurgitation (s/p repair 04/2015), on home O2 2L at night, meniere's disease, post-op N/V. Former smoker. BMI 25.5.   Pt hospitalized 3/3-3/20/17 for sternal wound infection, s/p excisional debridement of sternal wound, latissimus myocutaneous flap to reconstruct chest defect, and debridement of latissimus flap during hospital stay.   Medications include: ASA, bisprolol, lasix, duoneb, levothyroxine, losartan, protonix, potassium, simvastatin, spironolactone, coumadin.   Labs from hospitalization reviewed.   1 view CXR 11/14/15:  1. Right PICC is well positioned with its tip in the lower superior vena cava. 2. No other change from prior study. No acute cardiopulmonary disease.  EKG 11/20/15: Sinus rhythm with 1st degree A-V block with PACs. LAD. Incomplete RBBB.Nonspecific T wave abnormality  Echo 08/22/15:  - Left ventricle: The cavity size was moderately dilated. There was mild concentric hypertrophy. Systolic function was moderately to severely reduced. The estimated ejection fraction was in the range of 30% to 35%. Diffuse hypokinesis. Features are consistent with a pseudonormal left ventricular filling pattern, with concomitant abnormal relaxation and increased filling pressure (grade 2 diastolic dysfunction). - Mitral valve: Prior procedures included surgical repair and appears to be functioning normally. Mean gradient (D): 3 mm Hg. - Left atrium: The atrium was mildly dilated. - Pulmonary arteries: Systolic pressure was within the normal range. -  Impressions: Since previous echo in May, the mitral valve has been repaired, LVEF has worsened and pulmonary hypertension resolved.  Cardiac cath 10/28/14:  - Prox LAD stenosis 50-70%. - LCx small.  - Right dominant coronary circulation. Severe calcification throughout the RCA. 30% stenosis RCA and 40-50% stenosis ostial portion of the posterior descending.  - Left ventricular function was moderately reduced with EF 40%.   Pt will need further assessment DOS by her assigned anesthesiologist.   Willeen Cass, FNP-BC [facility]/Anesthesiology Phone: [phone] 12/05/2015 4:29 PM

## 2015-12-05 NOTE — Progress Notes (Signed)
Pt SDW-Pre-op call completed by both pt and pt spouse, Mr. [name] with pt consent. Spouse denies pt C/O any acute cardiopulmonary issues. Spouse made aware to have pt stop taking vitamins, Fish Oil, herbal medications and NSAID's. Spoke with Joellen Jersey, nursing staff, " per MD, pt is to hold Coumadin tonight and Aspirin the morning of procedure." Pt spouse made aware and verbalized understanding of all pre-op instructions. Anesthesia to review pt cardiac history ( see note).

## 2015-12-05 NOTE — Anesthesia Preprocedure Evaluation (Signed)
Anesthesia Evaluation  Patient identified by MRN, date of birth, ID band Patient awake    Reviewed: Allergy & Precautions, NPO status , Patient's Chart, lab work & pertinent test results  Airway Mallampati: II  TM Distance: >3 FB Neck ROM: Full    Dental  (+) Teeth Intact, Dental Advisory Given   Pulmonary former smoker,    breath sounds clear to auscultation       Cardiovascular hypertension,  Rhythm:Regular Rate:Normal     Neuro/Psych    GI/Hepatic   Endo/Other    Renal/GU      Musculoskeletal   Abdominal   Peds  Hematology   Anesthesia Other Findings   Reproductive/Obstetrics                             Anesthesia Physical Anesthesia Plan  ASA: III  Anesthesia Plan: General   Post-op Pain Management:    Induction: Intravenous  Airway Management Planned: LMA  Additional Equipment:   Intra-op Plan:   Post-operative Plan:   Informed Consent: I have reviewed the patients History and Physical, chart, labs and discussed the procedure including the risks, benefits and alternatives for the proposed anesthesia with the patient or authorized representative who has indicated his/her understanding and acceptance.     Plan Discussed with:   Anesthesia Plan Comments:         Anesthesia Quick Evaluation

## 2015-12-05 NOTE — Anesthesia Postprocedure Evaluation (Signed)
Anesthesia Post Note  Patient: [name]  Procedure(s) Performed: Procedure(s) (LRB): IRRIGATION AND DEBRIDEMENT WOUND (N/A) APPLICATION OF WOUND VAC (N/A) APPLICATION OF A-CELL OF CHEST/ABDOMEN (N/A)  Patient location during evaluation: PACU Anesthesia Type: General Level of consciousness: awake, awake and alert and oriented Pain management: pain level controlled Vital Signs Assessment: post-procedure vital signs reviewed and stable Respiratory status: spontaneous breathing, nonlabored ventilation and respiratory function stable Cardiovascular status: blood pressure returned to baseline Anesthetic complications: no    Last Vitals:  Filed Vitals:   11/26/15 2031 11/27/15 0526  BP: 137/43 136/42  Pulse: 66 62  Temp: 36.6 C 36.4 C  Resp: 17 17    Last Pain:  Filed Vitals:   11/27/15 0943  PainSc: 3                  Melroy Bougher COKER

## 2015-12-05 NOTE — Progress Notes (Signed)
Maudie Mercury, nursing staff, to make MD aware to enter orders.

## 2015-12-06 ENCOUNTER — Encounter (HOSPITAL_COMMUNITY): Payer: Self-pay | Admitting: *Deleted

## 2015-12-06 ENCOUNTER — Encounter (HOSPITAL_COMMUNITY)
Admission: RE | Disposition: A | Discharge status: Skilled Nursing Facility | Payer: Self-pay | Source: Ambulatory Visit | Attending: Plastic Surgery

## 2015-12-06 ENCOUNTER — Inpatient Hospital Stay (HOSPITAL_COMMUNITY): Payer: Medicare Other | Admitting: Emergency Medicine

## 2015-12-06 ENCOUNTER — Ambulatory Visit (HOSPITAL_COMMUNITY)
Admission: RE | Admit: 2015-12-06 | Discharge: 2015-12-06 | Disposition: A | Discharge status: Skilled Nursing Facility | Payer: Medicare Other | Source: Ambulatory Visit | Attending: Plastic Surgery | Admitting: Plastic Surgery

## 2015-12-06 DIAGNOSIS — Y832 Surgical operation with anastomosis, bypass or graft as the cause of abnormal reaction of the patient, or of later complication, without mention of misadventure at the time of the procedure: Secondary | ICD-10-CM | POA: Insufficient documentation

## 2015-12-06 DIAGNOSIS — E119 Type 2 diabetes mellitus without complications: Secondary | ICD-10-CM | POA: Diagnosis not present

## 2015-12-06 DIAGNOSIS — Z951 Presence of aortocoronary bypass graft: Secondary | ICD-10-CM | POA: Diagnosis not present

## 2015-12-06 DIAGNOSIS — Z87891 Personal history of nicotine dependence: Secondary | ICD-10-CM | POA: Insufficient documentation

## 2015-12-06 DIAGNOSIS — Z7982 Long term (current) use of aspirin: Secondary | ICD-10-CM | POA: Insufficient documentation

## 2015-12-06 DIAGNOSIS — Z853 Personal history of malignant neoplasm of breast: Secondary | ICD-10-CM | POA: Diagnosis not present

## 2015-12-06 DIAGNOSIS — Z7951 Long term (current) use of inhaled steroids: Secondary | ICD-10-CM | POA: Diagnosis not present

## 2015-12-06 DIAGNOSIS — Z952 Presence of prosthetic heart valve: Secondary | ICD-10-CM | POA: Diagnosis not present

## 2015-12-06 DIAGNOSIS — M954 Acquired deformity of chest and rib: Secondary | ICD-10-CM | POA: Diagnosis present

## 2015-12-06 DIAGNOSIS — Z950 Presence of cardiac pacemaker: Secondary | ICD-10-CM | POA: Insufficient documentation

## 2015-12-06 DIAGNOSIS — Z9981 Dependence on supplemental oxygen: Secondary | ICD-10-CM | POA: Diagnosis not present

## 2015-12-06 DIAGNOSIS — E039 Hypothyroidism, unspecified: Secondary | ICD-10-CM | POA: Diagnosis not present

## 2015-12-06 DIAGNOSIS — I2510 Atherosclerotic heart disease of native coronary artery without angina pectoris: Secondary | ICD-10-CM | POA: Insufficient documentation

## 2015-12-06 DIAGNOSIS — T86821 Skin graft (allograft) (autograft) failure: Secondary | ICD-10-CM | POA: Diagnosis not present

## 2015-12-06 DIAGNOSIS — I272 Other secondary pulmonary hypertension: Secondary | ICD-10-CM | POA: Diagnosis not present

## 2015-12-06 DIAGNOSIS — Z79899 Other long term (current) drug therapy: Secondary | ICD-10-CM | POA: Insufficient documentation

## 2015-12-06 DIAGNOSIS — E785 Hyperlipidemia, unspecified: Secondary | ICD-10-CM | POA: Insufficient documentation

## 2015-12-06 DIAGNOSIS — I5032 Chronic diastolic (congestive) heart failure: Secondary | ICD-10-CM | POA: Insufficient documentation

## 2015-12-06 DIAGNOSIS — Z923 Personal history of irradiation: Secondary | ICD-10-CM | POA: Diagnosis not present

## 2015-12-06 DIAGNOSIS — I480 Paroxysmal atrial fibrillation: Secondary | ICD-10-CM | POA: Insufficient documentation

## 2015-12-06 DIAGNOSIS — I110 Hypertensive heart disease with heart failure: Secondary | ICD-10-CM | POA: Diagnosis not present

## 2015-12-06 DIAGNOSIS — J449 Chronic obstructive pulmonary disease, unspecified: Secondary | ICD-10-CM | POA: Diagnosis not present

## 2015-12-06 DIAGNOSIS — Z7901 Long term (current) use of anticoagulants: Secondary | ICD-10-CM | POA: Insufficient documentation

## 2015-12-06 HISTORY — PX: I & D EXTREMITY: SHX5045

## 2015-12-06 LAB — BASIC METABOLIC PANEL
ANION GAP: 12 (ref 5–15)
BUN: 11 mg/dL (ref 6–20)
CO2: 26 mmol/L (ref 22–32)
Calcium: 9.4 mg/dL (ref 8.9–10.3)
Chloride: 103 mmol/L (ref 101–111)
Creatinine, Ser: 0.61 mg/dL (ref 0.44–1.00)
GFR calc Af Amer: >60 mL/min (ref >60)
GFR calc non Af Amer: >60 mL/min (ref >60)
GLUCOSE: 106 mg/dL — AB (ref 65–99)
POTASSIUM: 4.2 mmol/L (ref 3.5–5.1)
Sodium: 141 mmol/L (ref 135–145)

## 2015-12-06 LAB — CBC
HEMATOCRIT: 32.5 % — AB (ref 36.0–46.0)
Hemoglobin: 10.3 g/dL — ABNORMAL LOW (ref 12.0–15.0)
MCH: 28.5 pg (ref 26.0–34.0)
MCHC: 31.7 g/dL (ref 30.0–36.0)
MCV: 90 fL (ref 78.0–100.0)
PLATELETS: 292 10*3/uL (ref 150–400)
RBC: 3.61 MIL/uL — AB (ref 3.87–5.11)
RDW: 18.1 % — ABNORMAL HIGH (ref 11.5–15.5)
WBC: 8.9 10*3/uL (ref 4.0–10.5)

## 2015-12-06 LAB — APTT: APTT: 39 s — AB (ref 24–37)

## 2015-12-06 LAB — PROTIME-INR
INR: 1.58 — ABNORMAL HIGH (ref 0.00–1.49)
Prothrombin Time: 18.9 seconds — ABNORMAL HIGH (ref 11.6–15.2)

## 2015-12-06 LAB — GLUCOSE, CAPILLARY: GLUCOSE-CAPILLARY: 151 mg/dL — AB (ref 65–99)

## 2015-12-06 SURGERY — IRRIGATION AND DEBRIDEMENT EXTREMITY
Anesthesia: General | Site: Chest | Laterality: Right

## 2015-12-06 MED ORDER — PHENYLEPHRINE HCL 10 MG/ML IJ SOLN
10.0000 mg | INTRAVENOUS | Status: DC | PRN
  Administered 2015-12-06: 15 ug/min via INTRAVENOUS

## 2015-12-06 MED ORDER — MUPIROCIN 2 % EX OINT
TOPICAL_OINTMENT | CUTANEOUS | Status: AC
  Filled 2015-12-06: qty 22

## 2015-12-06 MED ORDER — LIDOCAINE HCL (CARDIAC) 20 MG/ML IV SOLN
INTRAVENOUS | Status: DC | PRN
  Administered 2015-12-06: 60 mg via INTRAVENOUS

## 2015-12-06 MED ORDER — FENTANYL CITRATE (PF) 250 MCG/5ML IJ SOLN
INTRAMUSCULAR | Status: AC
  Filled 2015-12-06: qty 5

## 2015-12-06 MED ORDER — MUPIROCIN CALCIUM 2 % EX CREA
TOPICAL_CREAM | CUTANEOUS | Status: AC
  Filled 2015-12-06: qty 15

## 2015-12-06 MED ORDER — PROPOFOL 10 MG/ML IV BOLUS
INTRAVENOUS | Status: DC | PRN
  Administered 2015-12-06: 100 mg via INTRAVENOUS

## 2015-12-06 MED ORDER — CIPROFLOXACIN IN D5W 400 MG/200ML IV SOLN
INTRAVENOUS | Status: AC
  Administered 2015-12-06: 400 mg via INTRAVENOUS
  Filled 2015-12-06: qty 200

## 2015-12-06 MED ORDER — DEXAMETHASONE SODIUM PHOSPHATE 4 MG/ML IJ SOLN
INTRAMUSCULAR | Status: DC | PRN
  Administered 2015-12-06: 4 mg via INTRAVENOUS

## 2015-12-06 MED ORDER — LACTATED RINGERS IV SOLN
INTRAVENOUS | Status: DC | PRN
  Administered 2015-12-06: 14:00:00 via INTRAVENOUS

## 2015-12-06 MED ORDER — FENTANYL CITRATE (PF) 100 MCG/2ML IJ SOLN
INTRAMUSCULAR | Status: DC | PRN
  Administered 2015-12-06: 50 ug via INTRAVENOUS

## 2015-12-06 MED ORDER — ETOMIDATE 2 MG/ML IV SOLN
INTRAVENOUS | Status: AC
  Filled 2015-12-06: qty 10

## 2015-12-06 MED ORDER — SODIUM CHLORIDE 0.9 % IR SOLN
Status: DC | PRN
  Administered 2015-12-06: 500 mL

## 2015-12-06 MED ORDER — SODIUM CHLORIDE 0.9 % IR SOLN
Status: DC | PRN
  Administered 2015-12-06: 1000 mL

## 2015-12-06 MED ORDER — ONDANSETRON HCL 4 MG/2ML IJ SOLN
INTRAMUSCULAR | Status: DC | PRN
  Administered 2015-12-06: 4 mg via INTRAVENOUS

## 2015-12-06 SURGICAL SUPPLY — 44 items
BANDAGE ELASTIC 4 VELCRO ST LF (GAUZE/BANDAGES/DRESSINGS) IMPLANT
BLADE SURG ROTATE 9660 (MISCELLANEOUS) IMPLANT
BNDG GAUZE ELAST 4 BULKY (GAUZE/BANDAGES/DRESSINGS) IMPLANT
CANISTER SUCTION 2500CC (MISCELLANEOUS) ×3 IMPLANT
CHLORAPREP W/TINT 26ML (MISCELLANEOUS) IMPLANT
COVER SURGICAL LIGHT HANDLE (MISCELLANEOUS) ×3 IMPLANT
DRAPE CHEST BREAST 15X10 FENES (DRAPES) ×3 IMPLANT
DRAPE EXTREMITY T 121X128X90 (DRAPE) IMPLANT
DRAPE INCISE IOBAN 66X45 STRL (DRAPES) IMPLANT
DRAPE ORTHO SPLIT 77X108 STRL (DRAPES)
DRAPE SURG ORHT 6 SPLT 77X108 (DRAPES) IMPLANT
DRESSING HYDROCOLLOID 4X4 (GAUZE/BANDAGES/DRESSINGS) ×3 IMPLANT
DRSG ADAPTIC 3X8 NADH LF (GAUZE/BANDAGES/DRESSINGS) IMPLANT
DRSG CUTIMED SORBACT 7X9 (GAUZE/BANDAGES/DRESSINGS) ×3 IMPLANT
DRSG MEPILEX BORDER 4X4 (GAUZE/BANDAGES/DRESSINGS) ×3 IMPLANT
DRSG PAD ABDOMINAL 8X10 ST (GAUZE/BANDAGES/DRESSINGS) IMPLANT
DRSG VAC ATS LRG SENSATRAC (GAUZE/BANDAGES/DRESSINGS) IMPLANT
DRSG VAC ATS MED SENSATRAC (GAUZE/BANDAGES/DRESSINGS) ×3 IMPLANT
DRSG VAC ATS SM SENSATRAC (GAUZE/BANDAGES/DRESSINGS) IMPLANT
ELECT REM PT RETURN 9FT ADLT (ELECTROSURGICAL) ×3
ELECTRODE REM PT RTRN 9FT ADLT (ELECTROSURGICAL) ×1 IMPLANT
GAUZE SPONGE 4X4 12PLY STRL (GAUZE/BANDAGES/DRESSINGS) IMPLANT
GEL ULTRASOUND 20GR AQUASONIC (MISCELLANEOUS) IMPLANT
GLOVE BIO SURGEON STRL SZ 6.5 (GLOVE) ×4 IMPLANT
GLOVE BIO SURGEONS STRL SZ 6.5 (GLOVE) ×2
GOWN STRL REUS W/ TWL LRG LVL3 (GOWN DISPOSABLE) ×2 IMPLANT
GOWN STRL REUS W/TWL LRG LVL3 (GOWN DISPOSABLE) ×4
HANDPIECE INTERPULSE COAX TIP (DISPOSABLE)
KIT BASIN OR (CUSTOM PROCEDURE TRAY) ×3 IMPLANT
KIT ROOM TURNOVER OR (KITS) ×3 IMPLANT
MARKER SKIN DUAL TIP RULER LAB (MISCELLANEOUS) ×3 IMPLANT
MATRIX SURGICAL PSM 7X10CM (Tissue) ×3 IMPLANT
MICROMATRIX 1000MG (Tissue) ×3 IMPLANT
NS IRRIG 1000ML POUR BTL (IV SOLUTION) ×3 IMPLANT
PACK GENERAL/GYN (CUSTOM PROCEDURE TRAY) ×3 IMPLANT
PAD ARMBOARD 7.5X6 YLW CONV (MISCELLANEOUS) ×6 IMPLANT
PAD NEG PRESSURE SENSATRAC (MISCELLANEOUS) IMPLANT
SET HNDPC FAN SPRY TIP SCT (DISPOSABLE) IMPLANT
SOLUTION PARTIC MCRMTRX 1000MG (Tissue) ×1 IMPLANT
SUT SILK 4 0 PS 2 (SUTURE) ×3 IMPLANT
SUT VIC AB 5-0 PS2 18 (SUTURE) ×6 IMPLANT
TOWEL OR 17X24 6PK STRL BLUE (TOWEL DISPOSABLE) IMPLANT
TOWEL OR 17X26 10 PK STRL BLUE (TOWEL DISPOSABLE) ×3 IMPLANT
UNDERPAD 30X30 INCONTINENT (UNDERPADS AND DIAPERS) IMPLANT

## 2015-12-06 NOTE — Interval H&P Note (Signed)
History and Physical Interval Note:  12/06/2015 1:29 PM  [name]  has presented today for surgery, with the diagnosis of RIGHT CHEST WALL WOUND  The various methods of treatment have been discussed with the patient and family. After consideration of risks, benefits and other options for treatment, the patient has consented to  Procedure(s): IRRIGATION AND DEBRIDEMENT RIGHT CHEST WALL WITH ACELL PLACEMENT AND VAC (Right) as a surgical intervention .  The patient's history has been reviewed, patient examined, no change in status, stable for surgery.  I have reviewed the patient's chart and labs.  Questions were answered to the patient's satisfaction.     Wallace Going

## 2015-12-06 NOTE — Anesthesia Procedure Notes (Signed)
Procedure Name: LMA Insertion Date/Time: 12/06/2015 1:40 PM Performed by: Trixie Deis A Pre-anesthesia Checklist: Patient identified, Emergency Drugs available, Suction available and Patient being monitored Patient Re-evaluated:Patient Re-evaluated prior to inductionOxygen Delivery Method: Circle System Utilized Preoxygenation: Pre-oxygenation with 100% oxygen Intubation Type: IV induction Ventilation: Mask ventilation without difficulty LMA: LMA inserted LMA Size: 4.0 Number of attempts: 1 Airway Equipment and Method: Bite block Placement Confirmation: positive ETCO2 Tube secured with: Tape Dental Injury: Teeth and Oropharynx as per pre-operative assessment

## 2015-12-06 NOTE — Op Note (Signed)
Operative Note   DATE OF OPERATION: 12/06/2015  LOCATION:  [facility]  SURGICAL DIVISION: Plastic Surgery  PREOPERATIVE DIAGNOSES:  Chest Wall Defect  6 x 10 cm  POSTOPERATIVE DIAGNOSES:  same  PROCEDURE:  Excisional sharp debridement of latissimus flap (fat) to chest wall defect with placement of Acell (7 x 10 cm sheet and 1 gm powder) and VAC placement  SURGEON: Ignatius Kloos Sanger Dontray Haberland, DO  ASSISTANT: Shawn Rayburn, PA  ANESTHESIA:  General.   COMPLICATIONS: None.   INDICATIONS FOR PROCEDURE:  The patient, [name] is a 81 y.o. female born on 1935/05/12, is here for treatment of a chest wall defect.  She underwent a valve replacement and CABG.  She had wound breakdown at the sternal incision site that healed and then had inferior and right sided chest / cartilage breakdown.  This is likely due to the radiation she received for treatment of her right breast cancer. She underwent a latissimus muscle flap and had superficial loss. MRN: [id number]  CONSENT:  Informed consent was obtained directly from the patient. Risks, benefits and alternatives were fully discussed. Specific risks including but not limited to bleeding, infection, hematoma, seroma, scarring, pain, infection, contracture, asymmetry, wound healing problems, and need for further surgery were all discussed. The patient did have an ample opportunity to have questions answered to satisfaction.   DESCRIPTION OF PROCEDURE:  The patient was taken to the operating room. SCDs were placed and IV antibiotics were given. The patient's operative site was prepped and draped in a sterile fashion. A time out was performed and all information was confirmed to be correct.  General anesthesia was administered.  The area was evaluated for blood supply and flow.  The fat layer 6 x 10 cm was debrided with a #10 blade to visualization of bleeding. The area was irrigated with antibiotic solution.  The Acell powder (1 gm) and  sheet (7 x 10 cm) was applied and secured with 5-0 Vicryl.  The sorbac was applied over it with KY gel and the VAC.  There was an excellent seal.  The patient tolerated the procedure well.  There were no complications. The patient was allowed to wake from anesthesia, extubated and taken to the recovery room in satisfactory condition.

## 2015-12-06 NOTE — Anesthesia Postprocedure Evaluation (Signed)
Anesthesia Post Note  Patient: [name]  Procedure(s) Performed: Procedure(s) (LRB): IRRIGATION AND DEBRIDEMENT RIGHT CHEST WALL WITH ACELL PLACEMENT AND VAC (Right)  Patient location during evaluation: PACU Anesthesia Type: General Level of consciousness: awake Pain management: pain level controlled Respiratory status: spontaneous breathing Anesthetic complications: no    Last Vitals:  Filed Vitals:   12/06/15 1445 12/06/15 1500  BP:  128/52  Pulse: 65 70  Temp:    Resp: 14 16    Last Pain:  Filed Vitals:   12/06/15 1511  PainSc: 0-No pain                 EDWARDS,Ryn Peine

## 2015-12-06 NOTE — Brief Op Note (Addendum)
12/06/2015  1:29 PM  PATIENT:  [name]  81 y.o. female  PRE-OPERATIVE DIAGNOSIS:  RIGHT CHEST WALL WOUND  POST-OPERATIVE DIAGNOSIS:  same  PROCEDURE:  Procedure(s): IRRIGATION AND DEBRIDEMENT RIGHT CHEST WALL WITH ACELL PLACEMENT AND VAC (Right)  SURGEON:  Surgeon(s) and Role:    * Rutilio Yellowhair S Arihanna Estabrook, DO - Primary  PHYSICIAN ASSISTANT: Shawn Rayburn, PA  ASSISTANTS: none   ANESTHESIA:   general  EBL:     BLOOD ADMINISTERED:none  DRAINS: none   LOCAL MEDICATIONS USED:  NONE  SPECIMEN:  No Specimen  DISPOSITION OF SPECIMEN:  N/A  COUNTS:  YES  TOURNIQUET:  * No tourniquets in log *  DICTATION: .Dragon Dictation  PLAN OF CARE: discharge  PATIENT DISPOSITION:  PACU - hemodynamically stable.   Delay start of Pharmacological VTE agent (>24hrs) due to surgical blood loss or risk of bleeding: no

## 2015-12-06 NOTE — Transfer of Care (Signed)
Immediate Anesthesia Transfer of Care Note  Patient: [name]  Procedure(s) Performed: Procedure(s): IRRIGATION AND DEBRIDEMENT RIGHT CHEST WALL WITH ACELL PLACEMENT AND VAC (Right)  Patient Location: PACU  Anesthesia Type:General  Level of Consciousness: awake, alert  and oriented  Airway & Oxygen Therapy: Patient Spontanous Breathing and Patient connected to nasal cannula oxygen  Post-op Assessment: Report given to RN, Post -op Vital signs reviewed and stable and Patient moving all extremities  Post vital signs: Reviewed and stable  Last Vitals:  Filed Vitals:   12/06/15 1054  BP: 133/60  Pulse: 77  Temp: 36.6 C  Resp: 16    Complications: No apparent anesthesia complications

## 2015-12-06 NOTE — Discharge Instructions (Signed)
Continue VAC at 125 mmHg pressure continuous.  Change VAC in one week. Plan OR in 2 weeks.

## 2015-12-06 NOTE — Anesthesia Preprocedure Evaluation (Addendum)
Anesthesia Evaluation  Patient identified by MRN, date of birth, ID band Patient awake    History of Anesthesia Complications (+) PONV and history of anesthetic complications  Airway Mallampati: II  TM Distance: >3 FB Neck ROM: Full    Dental  (+) Dental Advisory Given   Pulmonary COPD, former smoker,    breath sounds clear to auscultation       Cardiovascular hypertension, + CAD and +CHF  + dysrhythmias + pacemaker ([employer], L chest wall)  Rhythm:Regular Rate:Normal     Neuro/Psych Anxiety Cochlear implant    GI/Hepatic GERD  ,  Endo/Other  diabetes, Type 2Hypothyroidism   Renal/GU      Musculoskeletal   Abdominal   Peds  Hematology   Anesthesia Other Findings   Reproductive/Obstetrics                           Anesthesia Physical Anesthesia Plan  ASA: III  Anesthesia Plan: General   Post-op Pain Management:    Induction: Intravenous  Airway Management Planned: LMA  Additional Equipment:   Intra-op Plan:   Post-operative Plan:   Informed Consent: I have reviewed the patients History and Physical, chart, labs and discussed the procedure including the risks, benefits and alternatives for the proposed anesthesia with the patient or authorized representative who has indicated his/her understanding and acceptance.   Dental advisory given  Plan Discussed with: Anesthesiologist and CRNA  Anesthesia Plan Comments:         Anesthesia Quick Evaluation

## 2015-12-06 NOTE — H&P (View-Only) (Signed)
2 Days Post-Op  Subjective: The patient is sitting up in bed.  Doing well from second surgery.  VAC in place with good seal.  Drains doing well.    Objective: Vital signs in last 24 hours: Temp:  [97.6 F (36.4 C)-97.7 F (36.5 C)] 97.7 F (36.5 C) (03/16 1401) Pulse Rate:  [61-62] 61 (03/16 1401) Resp:  [18] 18 (03/16 1401) BP: (117-122)/(49-57) 122/57 mmHg (03/16 1401) SpO2:  [99 %] 99 % (03/16 1401) Last BM Date: 11/23/15 (very small)  Intake/Output from previous day: 03/15 0701 - 03/16 0700 In: 783 [P.O.:720; I.V.:3] Out: 720 [Urine:700; Drains:20] Intake/Output this shift:    General appearance: alert, cooperative and no distress Incision/Wound:VAC in place  Lab Results:   Recent Labs  11/21/15 0500 11/22/15 0330  WBC 9.6 8.3  HGB 7.1* 9.7*  HCT 22.5* 31.0*  PLT 187 217   BMET  Recent Labs  11/21/15 0500 11/22/15 0330  NA 137 137  K 4.1 4.4  CL 102 103  CO2 25 25  GLUCOSE 143* 126*  BUN 25* 24*  CREATININE 0.97 0.89  CALCIUM 8.9 8.7*   PT/INR No results for input(s): LABPROT, INR in the last 72 hours. ABG No results for input(s): PHART, HCO3 in the last 72 hours.  Invalid input(s): PCO2, PO2  Studies/Results: No results found.  Anti-infectives: Anti-infectives    Start     Dose/Rate Route Frequency Ordered Stop   11/21/15 0757  polymyxin B 500,000 Units, bacitracin 50,000 Units in sodium chloride irrigation 0.9 % 500 mL irrigation  Status:  Discontinued       As needed 11/21/15 0758 11/21/15 0831   11/21/15 0700  ciprofloxacin (CIPRO) IVPB 400 mg  Status:  Discontinued     400 mg 200 mL/hr over 60 Minutes Intravenous To ShortStay Surgical 11/20/15 1446 11/21/15 0921   11/18/15 1115  polymyxin B 500,000 Units, bacitracin 50,000 Units in sodium chloride irrigation 0.9 % 500 mL irrigation  Status:  Discontinued       As needed 11/18/15 1116 11/18/15 1216   11/18/15 0700  ciprofloxacin (CIPRO) IVPB 400 mg     400 mg 200 mL/hr over 60 Minutes  Intravenous On call to O.R. 11/17/15 2052 11/18/15 0900   11/14/15 1500  ceFEPIme (MAXIPIME) 2 g in dextrose 5 % 50 mL IVPB     2 g 100 mL/hr over 30 Minutes Intravenous Every 12 hours 11/14/15 1448 11/25/15 2359   11/13/15 0130  cefUROXime (ZINACEF) 1.5 g in dextrose 5 % 50 mL IVPB     1.5 g 100 mL/hr over 30 Minutes Intravenous To Short Stay 11/13/15 [id number] 11/13/15 1421   11/11/15 0230  vancomycin (VANCOCIN) IVPB 1000 mg/200 mL premix     1,000 mg 200 mL/hr over 60 Minutes Intravenous Every 24 hours 11/10/15 1339 11/25/15 2359   11/10/15 1345  vancomycin (VANCOCIN) IVPB 1000 mg/200 mL premix     1,000 mg 200 mL/hr over 60 Minutes Intravenous  Once 11/10/15 1335 11/10/15 1510   11/10/15 1245  ciprofloxacin (CIPRO) tablet 500 mg  Status:  Discontinued     500 mg Oral 2 times daily 11/10/15 1244 11/14/15 1448      Assessment/Plan: s/p Procedure(s): IRRIGATION AND DEBRIDEMENT WOUND (N/A) APPLICATION OF WOUND VAC (N/A) APPLICATION OF A-CELL OF CHEST/ABDOMEN (N/A) Discharge possible when VAC arrives.  Plan for more Acell in next 2-3 weeks.  Will follow in office in 2 weeks.  HH to change VAC Wed next week.  LOS: 13 days  Wallace Going 11/23/2015

## 2015-12-07 ENCOUNTER — Encounter (HOSPITAL_COMMUNITY): Payer: Self-pay | Admitting: Plastic Surgery

## 2015-12-08 ENCOUNTER — Ambulatory Visit (INDEPENDENT_AMBULATORY_CARE_PROVIDER_SITE_OTHER): Payer: Medicare Other

## 2015-12-08 DIAGNOSIS — Z953 Presence of xenogenic heart valve: Secondary | ICD-10-CM

## 2015-12-08 DIAGNOSIS — I4891 Unspecified atrial fibrillation: Secondary | ICD-10-CM

## 2015-12-08 DIAGNOSIS — I340 Nonrheumatic mitral (valve) insufficiency: Secondary | ICD-10-CM

## 2015-12-08 LAB — POCT INR: INR: 1.5

## 2015-12-11 ENCOUNTER — Ambulatory Visit: Payer: Medicare Other | Admitting: Thoracic Surgery (Cardiothoracic Vascular Surgery)

## 2015-12-12 LAB — FUNGUS CULTURE RESULT

## 2015-12-12 LAB — FUNGAL ORGANISM REFLEX

## 2015-12-12 LAB — FUNGUS CULTURE WITH STAIN

## 2015-12-13 LAB — FUNGUS CULTURE WITH STAIN

## 2015-12-13 LAB — FUNGUS CULTURE RESULT

## 2015-12-13 LAB — FUNGAL ORGANISM REFLEX

## 2015-12-14 NOTE — Discharge Summary (Signed)
Physician Discharge Summary  Patient ID: [name] MRN: [id number] DOB/AGE: Sep 23, 1934 81 y.o.  Admit date: 12/06/2015 Discharge date: 12/14/2015  Admission Diagnoses:  Discharge Diagnoses:  Active Problems:   * No active hospital problems. *   Discharged Condition: good  Hospital Course: The patient is a 81 yrs old female who was admitted and taken to the OR for debridement of the chest / breast wound.  She underwent surgery with Acell and VAC placement.  She was managed on the surgical unit postoperatively.  She was eating and tolerating fluids well at the time of discharge.  Her pain was well controlled.  Consults: None  Significant Diagnostic Studies: none  Treatments: surgery  Discharge Exam: Blood pressure 128/52, pulse 70, temperature 98.1 F (36.7 C), temperature source Oral, resp. rate 16, height 5' 1.5" (1.562 m), weight 62.143 kg (137 lb), SpO2 95 %. General appearance: alert, cooperative and no distress Incision/Wound: VAC in place with good seal.  Disposition: 03-Skilled Nursing Facility     Medication List    TAKE these medications        acetaminophen 325 MG tablet  Commonly known as:  TYLENOL  Take 2 tablets (650 mg total) by mouth every 4 (four) hours as needed for mild pain (or Fever >/= 101).     aspirin 81 MG EC tablet  Take 1 tablet (81 mg total) by mouth daily.     benzonatate 100 MG capsule  Commonly known as:  TESSALON  Take 100 mg by mouth 3 (three) times daily as needed for cough (for cold).     bisoprolol 10 MG tablet  Commonly known as:  ZEBETA  Take 1 tablet (10 mg total) by mouth daily.     fluticasone 50 MCG/ACT nasal spray  Commonly known as:  FLONASE  Place 1 spray into both nostrils daily as needed for allergies.     furosemide 20 MG tablet  Commonly known as:  LASIX  Take 20 mg by mouth 2 (two) times daily.     gabapentin 100 MG capsule  Commonly known as:  NEURONTIN  Take 100 mg by mouth 2 (two) times daily.     ipratropium-albuterol 0.5-2.5 (3) MG/3ML Soln  Commonly known as:  DUONEB  Take 3 mLs by nebulization every 6 (six) hours as needed (for shortness of breath).     levothyroxine 88 MCG tablet  Commonly known as:  SYNTHROID, LEVOTHROID  Take 88 mcg by mouth daily before breakfast.     losartan 100 MG tablet  Commonly known as:  COZAAR  Take 1 tablet (100 mg total) by mouth daily.     pantoprazole 40 MG tablet  Commonly known as:  PROTONIX  Take 40 mg by mouth 2 (two) times daily.     potassium chloride 10 MEQ tablet  Commonly known as:  K-DUR,KLOR-CON  Take 1 tablet (10 mEq total) by mouth daily.     simvastatin 20 MG tablet  Commonly known as:  ZOCOR  Take 20 mg by mouth daily.     spironolactone 25 MG tablet  Commonly known as:  ALDACTONE  Take 1 tablet (25 mg total) by mouth daily.     traMADol 50 MG tablet  Commonly known as:  ULTRAM  Take 1-2 tablets (50-100 mg total) by mouth every 6 (six) hours as needed (mild pain).     warfarin 2.5 MG tablet  Commonly known as:  COUMADIN  TAKE 1 TABLET BY MOUTH AS DIRECTED  Follow-up Information    Follow up with [place], DO In 2 weeks.   Specialty:  Plastic Surgery   Contact information:   [place] [address] [id number]       Signed: Wallace Going 12/14/2015, 7:12 AM

## 2015-12-15 ENCOUNTER — Ambulatory Visit (INDEPENDENT_AMBULATORY_CARE_PROVIDER_SITE_OTHER): Payer: Medicare Other | Admitting: Pharmacist

## 2015-12-15 ENCOUNTER — Other Ambulatory Visit: Payer: Self-pay | Admitting: Physician Assistant

## 2015-12-15 DIAGNOSIS — I4891 Unspecified atrial fibrillation: Secondary | ICD-10-CM

## 2015-12-15 DIAGNOSIS — I340 Nonrheumatic mitral (valve) insufficiency: Secondary | ICD-10-CM

## 2015-12-15 DIAGNOSIS — Z953 Presence of xenogenic heart valve: Secondary | ICD-10-CM

## 2015-12-15 LAB — POCT INR: INR: 1.5

## 2015-12-15 LAB — ACID FAST SMEAR (AFB)

## 2015-12-15 LAB — ACID FAST SMEAR (AFB, MYCOBACTERIA)

## 2015-12-18 ENCOUNTER — Telehealth: Payer: Self-pay

## 2015-12-18 NOTE — Telephone Encounter (Signed)
Jeani Hawking, [facility] RN called to report pt is scheduled for surgery on 12/21/15 (plastic surgeron) Dr Joseph Art.  Pt has been instructed to hold Coumadin Wednesday 4/12- Friday 4/14 by MD.  INR recheck was scheduled for 12/22/15 since pt will be holding Coumadin at that time, gave verbal order to resume Coumadin same dosage 2.5mg  QD except 3.75mg  on Sundays and Wednesdays after procedure and recheck INR on 12/29/15.

## 2015-12-19 ENCOUNTER — Other Ambulatory Visit: Payer: Self-pay | Admitting: Plastic Surgery

## 2015-12-19 DIAGNOSIS — S21101A Unspecified open wound of right front wall of thorax without penetration into thoracic cavity, initial encounter: Secondary | ICD-10-CM

## 2015-12-19 LAB — AFB CULTURE WITH SMEAR (NOT AT ARMC): Source:: 0

## 2015-12-19 LAB — ACID FAST CULTURE WITH REFLEXED SENSITIVITIES (MYCOBACTERIA)

## 2015-12-20 ENCOUNTER — Other Ambulatory Visit: Payer: Self-pay | Admitting: Plastic Surgery

## 2015-12-20 ENCOUNTER — Encounter (HOSPITAL_COMMUNITY): Payer: Self-pay | Admitting: *Deleted

## 2015-12-20 MED ORDER — CIPROFLOXACIN IN D5W 400 MG/200ML IV SOLN
400.0000 mg | INTRAVENOUS | Status: AC
  Administered 2015-12-21: 400 mg via INTRAVENOUS
  Filled 2015-12-20: qty 200

## 2015-12-20 NOTE — Progress Notes (Signed)
Pt SDW-Pre-op call completed by both pt and pt spouse, Mr. [name] with pt consent. Pt denies any acute cardiopulmonary issues. Pt made aware to stop taking vitamins, Fish Oil, herbal medications and NSAID's. Pt denies taking Gabapentin in the AM. Pt stated that she was told to stop Aspirin and Coumadin. Spouse stated that pt Coumadin is on hold tonight-Friday. Pt spouse stated, " I have everything written down from before," and  verbalized understanding of all pre-op instructions.

## 2015-12-21 ENCOUNTER — Ambulatory Visit (HOSPITAL_COMMUNITY): Payer: Medicare Other | Admitting: Anesthesiology

## 2015-12-21 ENCOUNTER — Encounter (HOSPITAL_COMMUNITY)
Admission: RE | Disposition: A | Discharge status: Home / Self Care | Payer: Self-pay | Source: Ambulatory Visit | Attending: Plastic Surgery

## 2015-12-21 ENCOUNTER — Ambulatory Visit (HOSPITAL_COMMUNITY)
Admission: RE | Admit: 2015-12-21 | Discharge: 2015-12-21 | Disposition: A | Discharge status: Home / Self Care | Payer: Medicare Other | Source: Ambulatory Visit | Attending: Plastic Surgery | Admitting: Plastic Surgery

## 2015-12-21 ENCOUNTER — Encounter (HOSPITAL_COMMUNITY): Payer: Self-pay | Admitting: *Deleted

## 2015-12-21 DIAGNOSIS — I110 Hypertensive heart disease with heart failure: Secondary | ICD-10-CM | POA: Insufficient documentation

## 2015-12-21 DIAGNOSIS — T8189XA Other complications of procedures, not elsewhere classified, initial encounter: Secondary | ICD-10-CM | POA: Insufficient documentation

## 2015-12-21 DIAGNOSIS — E785 Hyperlipidemia, unspecified: Secondary | ICD-10-CM | POA: Diagnosis not present

## 2015-12-21 DIAGNOSIS — I480 Paroxysmal atrial fibrillation: Secondary | ICD-10-CM | POA: Diagnosis not present

## 2015-12-21 DIAGNOSIS — K219 Gastro-esophageal reflux disease without esophagitis: Secondary | ICD-10-CM | POA: Insufficient documentation

## 2015-12-21 DIAGNOSIS — Z87891 Personal history of nicotine dependence: Secondary | ICD-10-CM | POA: Diagnosis not present

## 2015-12-21 DIAGNOSIS — Z923 Personal history of irradiation: Secondary | ICD-10-CM | POA: Diagnosis not present

## 2015-12-21 DIAGNOSIS — Y831 Surgical operation with implant of artificial internal device as the cause of abnormal reaction of the patient, or of later complication, without mention of misadventure at the time of the procedure: Secondary | ICD-10-CM | POA: Diagnosis not present

## 2015-12-21 DIAGNOSIS — Z79899 Other long term (current) drug therapy: Secondary | ICD-10-CM | POA: Insufficient documentation

## 2015-12-21 DIAGNOSIS — I2510 Atherosclerotic heart disease of native coronary artery without angina pectoris: Secondary | ICD-10-CM | POA: Diagnosis not present

## 2015-12-21 DIAGNOSIS — J449 Chronic obstructive pulmonary disease, unspecified: Secondary | ICD-10-CM | POA: Diagnosis not present

## 2015-12-21 DIAGNOSIS — I5032 Chronic diastolic (congestive) heart failure: Secondary | ICD-10-CM | POA: Diagnosis not present

## 2015-12-21 DIAGNOSIS — I272 Other secondary pulmonary hypertension: Secondary | ICD-10-CM | POA: Insufficient documentation

## 2015-12-21 DIAGNOSIS — E039 Hypothyroidism, unspecified: Secondary | ICD-10-CM | POA: Diagnosis not present

## 2015-12-21 DIAGNOSIS — I442 Atrioventricular block, complete: Secondary | ICD-10-CM | POA: Diagnosis not present

## 2015-12-21 DIAGNOSIS — S21101A Unspecified open wound of right front wall of thorax without penetration into thoracic cavity, initial encounter: Secondary | ICD-10-CM

## 2015-12-21 DIAGNOSIS — Z951 Presence of aortocoronary bypass graft: Secondary | ICD-10-CM | POA: Insufficient documentation

## 2015-12-21 DIAGNOSIS — Z950 Presence of cardiac pacemaker: Secondary | ICD-10-CM | POA: Diagnosis not present

## 2015-12-21 DIAGNOSIS — Z9981 Dependence on supplemental oxygen: Secondary | ICD-10-CM | POA: Insufficient documentation

## 2015-12-21 DIAGNOSIS — M954 Acquired deformity of chest and rib: Secondary | ICD-10-CM | POA: Insufficient documentation

## 2015-12-21 DIAGNOSIS — Z853 Personal history of malignant neoplasm of breast: Secondary | ICD-10-CM | POA: Diagnosis not present

## 2015-12-21 DIAGNOSIS — Z952 Presence of prosthetic heart valve: Secondary | ICD-10-CM | POA: Diagnosis not present

## 2015-12-21 HISTORY — PX: INCISION AND DRAINAGE OF WOUND: SHX1803

## 2015-12-21 HISTORY — PX: APPLICATION OF A-CELL OF CHEST/ABDOMEN: SHX6302

## 2015-12-21 HISTORY — PX: APPLICATION OF WOUND VAC: SHX5189

## 2015-12-21 LAB — CBC
HEMATOCRIT: 33.5 % — AB (ref 36.0–46.0)
HEMOGLOBIN: 10.5 g/dL — AB (ref 12.0–15.0)
MCH: 28.2 pg (ref 26.0–34.0)
MCHC: 31.3 g/dL (ref 30.0–36.0)
MCV: 90.1 fL (ref 78.0–100.0)
Platelets: 235 10*3/uL (ref 150–400)
RBC: 3.72 MIL/uL — AB (ref 3.87–5.11)
RDW: 18.1 % — ABNORMAL HIGH (ref 11.5–15.5)
WBC: 10.6 10*3/uL — AB (ref 4.0–10.5)

## 2015-12-21 LAB — BASIC METABOLIC PANEL
ANION GAP: 14 (ref 5–15)
BUN: 16 mg/dL (ref 6–20)
CALCIUM: 9.5 mg/dL (ref 8.9–10.3)
CO2: 23 mmol/L (ref 22–32)
Chloride: 104 mmol/L (ref 101–111)
Creatinine, Ser: 0.81 mg/dL (ref 0.44–1.00)
GLUCOSE: 127 mg/dL — AB (ref 65–99)
POTASSIUM: 4.1 mmol/L (ref 3.5–5.1)
SODIUM: 141 mmol/L (ref 135–145)

## 2015-12-21 LAB — PROTIME-INR
INR: 1.77 — AB (ref 0.00–1.49)
PROTHROMBIN TIME: 20.6 s — AB (ref 11.6–15.2)

## 2015-12-21 SURGERY — IRRIGATION AND DEBRIDEMENT WOUND
Anesthesia: General | Site: Chest | Laterality: Right

## 2015-12-21 MED ORDER — MUPIROCIN 2 % EX OINT
TOPICAL_OINTMENT | CUTANEOUS | Status: AC
  Filled 2015-12-21: qty 22

## 2015-12-21 MED ORDER — LACTATED RINGERS IV SOLN
INTRAVENOUS | Status: DC
  Administered 2015-12-21 (×2): via INTRAVENOUS

## 2015-12-21 MED ORDER — TRAMADOL HCL 50 MG PO TABS: 50.0000 mg | ORAL_TABLET | Freq: Two times a day (BID) | ORAL | Status: DC | PRN

## 2015-12-21 MED ORDER — PROPOFOL 10 MG/ML IV BOLUS
INTRAVENOUS | Status: AC
  Filled 2015-12-21: qty 40

## 2015-12-21 MED ORDER — SODIUM CHLORIDE 0.9 % IR SOLN
Status: DC | PRN
  Administered 2015-12-21: 1000 mL

## 2015-12-21 MED ORDER — PHENYLEPHRINE 40 MCG/ML (10ML) SYRINGE FOR IV PUSH (FOR BLOOD PRESSURE SUPPORT)
PREFILLED_SYRINGE | INTRAVENOUS | Status: AC
  Filled 2015-12-21: qty 30

## 2015-12-21 MED ORDER — ONDANSETRON HCL 4 MG/2ML IJ SOLN
INTRAMUSCULAR | Status: DC | PRN
  Administered 2015-12-21: 4 mg via INTRAVENOUS

## 2015-12-21 MED ORDER — SODIUM CHLORIDE 0.9 % IR SOLN
Status: DC | PRN
  Administered 2015-12-21: 500 mL

## 2015-12-21 MED ORDER — PROPOFOL 10 MG/ML IV BOLUS
INTRAVENOUS | Status: DC | PRN
  Administered 2015-12-21: 130 mg via INTRAVENOUS

## 2015-12-21 MED ORDER — LIDOCAINE HCL (CARDIAC) 20 MG/ML IV SOLN
INTRAVENOUS | Status: AC
  Filled 2015-12-21: qty 5

## 2015-12-21 MED ORDER — ONDANSETRON HCL 4 MG/2ML IJ SOLN
INTRAMUSCULAR | Status: AC
  Filled 2015-12-21: qty 2

## 2015-12-21 MED ORDER — FENTANYL CITRATE (PF) 100 MCG/2ML IJ SOLN
INTRAMUSCULAR | Status: DC | PRN
  Administered 2015-12-21: 100 ug via INTRAVENOUS

## 2015-12-21 MED ORDER — DEXAMETHASONE SODIUM PHOSPHATE 4 MG/ML IJ SOLN
INTRAMUSCULAR | Status: DC | PRN
  Administered 2015-12-21: 4 mg via INTRAVENOUS

## 2015-12-21 MED ORDER — PHENYLEPHRINE HCL 10 MG/ML IJ SOLN
INTRAMUSCULAR | Status: DC | PRN
  Administered 2015-12-21: 120 ug via INTRAVENOUS

## 2015-12-21 MED ORDER — FENTANYL CITRATE (PF) 250 MCG/5ML IJ SOLN
INTRAMUSCULAR | Status: AC
  Filled 2015-12-21: qty 5

## 2015-12-21 MED ORDER — LIDOCAINE HCL (CARDIAC) 20 MG/ML IV SOLN
INTRAVENOUS | Status: DC | PRN
  Administered 2015-12-21: 100 mg via INTRAVENOUS

## 2015-12-21 SURGICAL SUPPLY — 56 items
BAG DECANTER FOR FLEXI CONT (MISCELLANEOUS) ×3 IMPLANT
BENZOIN TINCTURE PRP APPL 2/3 (GAUZE/BANDAGES/DRESSINGS) IMPLANT
BLADE 10 SAFETY STRL DISP (BLADE) IMPLANT
CANISTER SUCTION 2500CC (MISCELLANEOUS) ×3 IMPLANT
CONT SPEC STER OR (MISCELLANEOUS) IMPLANT
COVER SURGICAL LIGHT HANDLE (MISCELLANEOUS) ×3 IMPLANT
DRAPE IMP U-DRAPE 54X76 (DRAPES) ×3 IMPLANT
DRAPE INCISE IOBAN 66X45 STRL (DRAPES) ×3 IMPLANT
DRAPE LAPAROSCOPIC ABDOMINAL (DRAPES) ×3 IMPLANT
DRAPE PED LAPAROTOMY (DRAPES) IMPLANT
DRAPE PROXIMA HALF (DRAPES) IMPLANT
DRESSING HYDROCOLLOID 4X4 (GAUZE/BANDAGES/DRESSINGS) IMPLANT
DRSG ADAPTIC 3X8 NADH LF (GAUZE/BANDAGES/DRESSINGS) IMPLANT
DRSG EMULSION OIL 3X3 NADH (GAUZE/BANDAGES/DRESSINGS) IMPLANT
DRSG PAD ABDOMINAL 8X10 ST (GAUZE/BANDAGES/DRESSINGS) IMPLANT
DRSG VAC ATS LRG SENSATRAC (GAUZE/BANDAGES/DRESSINGS) IMPLANT
DRSG VAC ATS MED SENSATRAC (GAUZE/BANDAGES/DRESSINGS) ×3 IMPLANT
DRSG VAC ATS SM SENSATRAC (GAUZE/BANDAGES/DRESSINGS) IMPLANT
ELECT CAUTERY BLADE 6.4 (BLADE) ×3 IMPLANT
ELECT REM PT RETURN 9FT ADLT (ELECTROSURGICAL) ×3
ELECTRODE REM PT RTRN 9FT ADLT (ELECTROSURGICAL) ×1 IMPLANT
GAUZE SPONGE 4X4 12PLY STRL (GAUZE/BANDAGES/DRESSINGS) IMPLANT
GAUZE SPONGE 4X4 16PLY XRAY LF (GAUZE/BANDAGES/DRESSINGS) IMPLANT
GLOVE BIO SURGEON STRL SZ 6.5 (GLOVE) ×2 IMPLANT
GLOVE BIO SURGEONS STRL SZ 6.5 (GLOVE) ×1
GLOVE BIOGEL PI IND STRL 7.0 (GLOVE) ×2 IMPLANT
GLOVE BIOGEL PI INDICATOR 7.0 (GLOVE) ×4
GLOVE ECLIPSE 7.0 STRL STRAW (GLOVE) ×3 IMPLANT
GLOVE SURG SS PI 7.0 STRL IVOR (GLOVE) ×3 IMPLANT
GOWN STRL REUS W/ TWL LRG LVL3 (GOWN DISPOSABLE) ×3 IMPLANT
GOWN STRL REUS W/TWL LRG LVL3 (GOWN DISPOSABLE) ×6
KIT BASIN OR (CUSTOM PROCEDURE TRAY) ×3 IMPLANT
KIT ROOM TURNOVER OR (KITS) ×3 IMPLANT
MATRIX SURGICAL PSM 7X10CM (Tissue) ×3 IMPLANT
MICROMATRIX 1000MG (Tissue) ×3 IMPLANT
NS IRRIG 1000ML POUR BTL (IV SOLUTION) ×3 IMPLANT
PACK GENERAL/GYN (CUSTOM PROCEDURE TRAY) ×3 IMPLANT
PACK SURGICAL SETUP 50X90 (CUSTOM PROCEDURE TRAY) ×3 IMPLANT
PACK UNIVERSAL I (CUSTOM PROCEDURE TRAY) IMPLANT
PAD ARMBOARD 7.5X6 YLW CONV (MISCELLANEOUS) ×6 IMPLANT
PENCIL BUTTON HOLSTER BLD 10FT (ELECTRODE) IMPLANT
SOLUTION PARTIC MCRMTRX 1000MG (Tissue) ×1 IMPLANT
STAPLER VISISTAT 35W (STAPLE) IMPLANT
SURGILUBE 2OZ TUBE FLIPTOP (MISCELLANEOUS) ×6 IMPLANT
SUT CHROMIC 4 0 P 3 18 (SUTURE) IMPLANT
SUT ETHILON 4 0 PS 2 18 (SUTURE) IMPLANT
SUT ETHILON 5 0 P 3 18 (SUTURE)
SUT NYLON ETHILON 5-0 P-3 1X18 (SUTURE) IMPLANT
SUT SILK 4 0 RB 1 (SUTURE) ×3 IMPLANT
SUT VIC AB 5-0 PS2 18 (SUTURE) ×6 IMPLANT
SWAB COLLECTION DEVICE MRSA (MISCELLANEOUS) IMPLANT
SYR BULB 3OZ (MISCELLANEOUS) IMPLANT
TOWEL OR 17X24 6PK STRL BLUE (TOWEL DISPOSABLE) IMPLANT
TOWEL OR 17X26 10 PK STRL BLUE (TOWEL DISPOSABLE) ×3 IMPLANT
TUBE ANAEROBIC SPECIMEN COL (MISCELLANEOUS) IMPLANT
UNDERPAD 30X30 INCONTINENT (UNDERPADS AND DIAPERS) IMPLANT

## 2015-12-21 NOTE — OR Nursing (Signed)
Right JP flank dressing removed at 1210 by Dr. Marla Roe.  Muprocin, Mepiplex, 4x4s, and cutimed on right flank wound.

## 2015-12-21 NOTE — Brief Op Note (Signed)
12/21/2015  12:41 PM  PATIENT:  [name]  81 y.o. female  PRE-OPERATIVE DIAGNOSIS:  right chest wall wound  POST-OPERATIVE DIAGNOSIS:  right chest wall wound  PROCEDURE:  Procedure(s) with comments: IRRIGATION AND DEBRIDEMENT right chest wall WOUND (Right) - right chest wall  APPLICATION OF WOUND VAC to right chest wall  (Right) APPLICATION OF A-CELL OF RIGHT CHEST (Right)  SURGEON:  Surgeon(s) and Role:    * Takumi Din S Arel Tippen, DO - Primary  PHYSICIAN ASSISTANT:   ASSISTANTS: none   ANESTHESIA:   general  EBL:     BLOOD ADMINISTERED:none  DRAINS: none   LOCAL MEDICATIONS USED:  NONE  SPECIMEN:  No Specimen  DISPOSITION OF SPECIMEN:  N/A  COUNTS:  YES  TOURNIQUET:  * No tourniquets in log *  DICTATION: .Dragon Dictation  PLAN OF CARE: Discharge to home after PACU  PATIENT DISPOSITION:  PACU - hemodynamically stable.   Delay start of Pharmacological VTE agent (>24hrs) due to surgical blood loss or risk of bleeding: no

## 2015-12-21 NOTE — Anesthesia Preprocedure Evaluation (Addendum)
Anesthesia Evaluation  Patient identified by MRN, date of birth, ID band Patient awake    Reviewed: Allergy & Precautions, NPO status , Patient's Chart, lab work & pertinent test results  History of Anesthesia Complications (+) PONV and history of anesthetic complications  Airway Mallampati: II  TM Distance: >3 FB Neck ROM: Full    Dental   Pulmonary COPD, former smoker,    breath sounds clear to auscultation       Cardiovascular hypertension, Pt. on medications + CAD, + CABG and +CHF  + dysrhythmias Atrial Fibrillation + pacemaker  Rhythm:Regular Rate:Normal     Neuro/Psych PSYCHIATRIC DISORDERS Anxiety negative neurological ROS     GI/Hepatic Neg liver ROS, GERD  Medicated,  Endo/Other  diabetesHypothyroidism   Renal/GU negative Renal ROS  negative genitourinary   Musculoskeletal  (+) Arthritis ,   Abdominal   Peds negative pediatric ROS (+)  Hematology negative hematology ROS (+)   Anesthesia Other Findings - HLD  Reproductive/Obstetrics negative OB ROS                            Lab Results  Component Value Date   WBC 8.9 12/06/2015   HGB 10.3* 12/06/2015   HCT 32.5* 12/06/2015   MCV 90.0 12/06/2015   PLT 292 12/06/2015   Lab Results  Component Value Date   CREATININE 0.61 12/06/2015   BUN 11 12/06/2015   NA 141 12/06/2015   K 4.2 12/06/2015   CL 103 12/06/2015   CO2 26 12/06/2015   Lab Results  Component Value Date   INR 1.5 12/15/2015   INR 1.5 12/08/2015   INR 1.58* 12/06/2015   11/2015 EKG: normal sinus rhythm, 1st degree AV block.  2016 Echo - Left ventricle: The cavity size was moderately dilated. There was mild concentric hypertrophy. Systolic function was moderately to severely reduced. The estimated ejection fraction was in the range of 30% to 35%. Diffuse hypokinesis. Features are consistent with a pseudonormal left ventricular filling pattern,  with concomitant abnormal relaxation and increased filling pressure (grade 2 diastolic dysfunction). - Mitral valve: Prior procedures included surgical repair and appears to be functioning normally. Mean gradient (D): 3 mm Hg. - Left atrium: The atrium was mildly dilated. - Pulmonary arteries: Systolic pressure was within the normal range.    Anesthesia Physical Anesthesia Plan  ASA: III  Anesthesia Plan: General   Post-op Pain Management:    Induction: Intravenous  Airway Management Planned: LMA  Additional Equipment:   Intra-op Plan:   Post-operative Plan: Extubation in OR  Informed Consent: I have reviewed the patients History and Physical, chart, labs and discussed the procedure including the risks, benefits and alternatives for the proposed anesthesia with the patient or authorized representative who has indicated his/her understanding and acceptance.   Dental advisory given  Plan Discussed with: CRNA and Anesthesiologist  Anesthesia Plan Comments:        Anesthesia Quick Evaluation

## 2015-12-21 NOTE — H&P (Signed)
[name] is an 81 y.o. female.   Chief Complaint: chest / breast defect wound HPI:The patient is a 81 yrs old wf here for treatment of a chronic chest wound.  She underwent open heart surgery last year and has a wound that is resistant to heal. She had right breast radiation for breast cancer in the past.  She has been taken to the OR for debridement and ACell / VAC placement.  Past Medical History  Diagnosis Date  . Hypertension   . COPD (chronic obstructive pulmonary disease) ([place])   . Hyperlipidemia   . Meniere disease   . PAF (paroxysmal atrial fibrillation) ([place])   . COPD (chronic obstructive pulmonary disease) ([place])   . Hypothyroidism   . Pulmonary hypertension ([place])   . Chronic diastolic CHF (congestive heart failure) ([place])   . PONV (postoperative nausea and vomiting)   . Anxiety   . GERD (gastroesophageal reflux disease)   . Severe Mitral Regurgitation s/p MV Repair     a. 04/2015 s/p 27 mm [facility] Mitral bovine bioprosthetic tissue valve  . Tricuspid Regurgitation s/p Repair     a. 04/2015 s/p 26 mm Edwards mc3 ring annuloplasty  . 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 04/2015 Complete bilateral atrial lesion set using cryothermy and bipolar radiofrequency ablation with clipping of LA appendage  . CAD s/p CABG x 1     a. 04/2015 LIMA to LAD  . Post-surgical complete heart block, symptomatic     a. 04/2015 s/p MDT [id number] Advisa DR MRI DC PPM.  . Wound infection ([place]) 10/13/2015    Superficial sternal wound infection  . Cancer of right breast ([place]) 08/09/2001  . Presence of permanent cardiac pacemaker   . On home oxygen therapy     "2L at night" (11/10/2015)  . Pneumonia ~ 2010  . Arthritis     "hands" (11/10/2015)  . Surgical wound, non healing - chest wall 11/10/2015    Past Surgical History  Procedure Laterality Date  . Cochlear implant Left 2005?  Marland Kitchen Cardiac catheterization  11/2013    [facility]  . Cardiac catheterization  10/2014    [facility]  . Mitral valve repair  N/A 04/25/2015    Procedure: MITRAL VALVE  REPLACEMENT using a 27 mm Edwards Perimount Magna Mitral Ease Valve;  Surgeon: Rexene Alberts, MD;  Location: [place];  Service: Open Heart Surgery;  Laterality: N/A;  . Tricuspid valve replacement N/A 04/25/2015    Procedure: TRICUSPID VALVE REPAIR;  Surgeon: Rexene Alberts, MD;  Location: [place];  Service: Open Heart Surgery;  Laterality: N/A;  . Maze N/A 04/25/2015    Procedure: MAZE;  Surgeon: Rexene Alberts, MD;  Location: [place];  Service: Open Heart Surgery;  Laterality: N/A;  . Tee without cardioversion N/A 04/25/2015    Procedure: TRANSESOPHAGEAL ECHOCARDIOGRAM (TEE);  Surgeon: Rexene Alberts, MD;  Location: [place];  Service: Open Heart Surgery;  Laterality: N/A;  . Clipping of atrial appendage N/A 04/25/2015    Procedure: CLIPPING OF ATRIAL APPENDAGE;  Surgeon: Rexene Alberts, MD;  Location: [place];  Service: Open Heart Surgery;  Laterality: N/A;  . Ep implantable device N/A 05/02/2015    Procedure: Pacemaker Implant;  Surgeon: Thompson Grayer, MD;  Location: [place] [facility];  Service: Cardiovascular;  Laterality: N/A;  . Sternal wound debridement N/A 05/09/2015    Procedure: STERNAL WOUND DEBRIDEMENT;  Surgeon: Rexene Alberts, MD;  Location: [place];  Service: Thoracic;  Laterality: N/A;  . Application of wound vac N/A 05/09/2015    Procedure: APPLICATION OF WOUND VAC;  Surgeon: Rexene Alberts, MD;  Location: [place];  Service: Thoracic;  Laterality: N/A;  . Sternal wires removal N/A 06/05/2015    Procedure: STERNAL WIRES REMOVAL;  Surgeon: Rexene Alberts, MD;  Location: [place];  Service: Thoracic;  Laterality: N/A;  . Tonsillectomy    . Breast biopsy Left 11/25/06    neg  . Breast biopsy Left 01/20/12    /clip-neg  . Mastectomy, partial Right 2002  . Vaginal hysterectomy    . Dilation and curettage of uterus  "several before hysterectomy"  . Cataract extraction w/ intraocular lens  implant, bilateral Bilateral 2014  . Cardiac valve replacement    .  Insert / replace / remove pacemaker    . Coronary artery bypass graft N/A 04/25/2015    Procedure: CORONARY ARTERY BYPASS GRAFTING (CABG) x ONE, using left internal mammary artery;  Surgeon: Rexene Alberts, MD;  Location: [place];  Service: Open Heart Surgery;  Laterality: N/A;  . Sternal wound debridement N/A 11/13/2015    Procedure: Excisional drainage of RIGHT Chest wall mass and breast mass ;  Surgeon: Rexene Alberts, MD;  Location: [place];  Service: Thoracic;  Laterality: N/A;  . Application of wound vac N/A 11/13/2015    Procedure: APPLICATION OF WOUND VAC;  Surgeon: Rexene Alberts, MD;  Location: [place];  Service: Thoracic;  Laterality: N/A;  . Incision and drainage of wound N/A 11/21/2015    Procedure: IRRIGATION AND DEBRIDEMENT WOUND;  Surgeon: Loel Lofty Dillingham, DO;  Location: [place];  Service: Plastics;  Laterality: N/A;  . Application of wound vac N/A 11/21/2015    Procedure: APPLICATION OF WOUND VAC;  Surgeon: Loel Lofty Dillingham, DO;  Location: [place];  Service: Plastics;  Laterality: N/A;  . Application of a-cell of chest/abdomen N/A 11/21/2015    Procedure: APPLICATION OF A-CELL OF CHEST/ABDOMEN;  Surgeon: Loel Lofty Dillingham, DO;  Location: [place];  Service: Plastics;  Laterality: N/A;  . Tram Right 11/18/2015    Procedure: VRAM Vertical Rectus Abdominus Muscle Flap;  Surgeon: Loel Lofty Dillingham, DO;  Location: [place];  Service: Plastics;  Laterality: Right;  RIght Back  . I&d extremity Right 12/06/2015    Procedure: IRRIGATION AND DEBRIDEMENT RIGHT CHEST WALL WITH ACELL PLACEMENT AND VAC;  Surgeon: Loel Lofty Dillingham, DO;  Location: [place];  Service: Plastics;  Laterality: Right;    Family History  Problem Relation Age of Onset  . Heart disease Father   . Heart disease Brother   . Heart attack Paternal Uncle    Social History:  reports that she quit smoking about 17 years ago. Her smoking use included Cigarettes. She has a 60 pack-year smoking history. She has never used smokeless  tobacco. She reports that she drinks alcohol. She reports that she does not use illicit drugs.  Allergies:  Allergies  Allergen Reactions  . Ace Inhibitors Cough  . Augmentin [Amoxicillin-Pot Clavulanate] Swelling    JOINTS, PAIN  . Penicillins Swelling    Swollen joints, Has patient had a PCN reaction causing immediate rash, facial/tongue/throat swelling, SOB or lightheadedness with hypotension: Yes Has patient had a PCN reaction causing severe rash involving mucus membranes or skin necrosis: No Has patient had a PCN reaction that required hospitalization No Has patient had a PCN reaction occurring within the last 10 years: No If all of the above answers are "  NO", then may proceed with Cephalosporin use.   . Nifedipine Other (See Comments)    UNSPECIFIED  . Propranolol Other (See Comments)    UNSPECIFIED  . Diazepam Other (See Comments)    Made her "hyper"  . Meperidine Other (See Comments)    Made her "hyper"  . Propoxyphene Other (See Comments)    Made her "hyper"    No prescriptions prior to admission    Results for orders placed or performed during the hospital encounter of 12/21/15 (from the past 48 hour(s))  Basic metabolic panel     Status: Abnormal   Collection Time: 12/21/15  9:14 AM  Result Value Ref Range   Sodium 141 135 - 145 mmol/L   Potassium 4.1 3.5 - 5.1 mmol/L   Chloride 104 101 - 111 mmol/L   CO2 23 22 - 32 mmol/L   Glucose, Bld 127 (H) 65 - 99 mg/dL   BUN 16 6 - 20 mg/dL   Creatinine, Ser 0.81 0.44 - 1.00 mg/dL   Calcium 9.5 8.9 - 10.3 mg/dL   GFR calc non Af Amer >60 >60 mL/min   GFR calc Af Amer >60 >60 mL/min    Comment: (NOTE) The eGFR has been calculated using the CKD EPI equation. This calculation has not been validated in all clinical situations. eGFR's persistently <60 mL/min signify possible Chronic Kidney Disease.    Anion gap 14 5 - 15  CBC     Status: Abnormal   Collection Time: 12/21/15  9:14 AM  Result Value Ref Range   WBC 10.6  (H) 4.0 - 10.5 K/uL   RBC 3.72 (L) 3.87 - 5.11 MIL/uL   Hemoglobin 10.5 (L) 12.0 - 15.0 g/dL   HCT 33.5 (L) 36.0 - 46.0 %   MCV 90.1 78.0 - 100.0 fL   MCH 28.2 26.0 - 34.0 pg   MCHC 31.3 30.0 - 36.0 g/dL   RDW 18.1 (H) 11.5 - 15.5 %   Platelets 235 150 - 400 K/uL  PT- INR Day of Surgery     Status: Abnormal   Collection Time: 12/21/15  9:14 AM  Result Value Ref Range   Prothrombin Time 20.6 (H) 11.6 - 15.2 seconds   INR 1.77 (H) 0.00 - 1.49   No results found.  Review of Systems  Constitutional: Negative.   HENT: Negative.   Eyes: Negative.   Respiratory: Negative.   Cardiovascular: Negative.   Gastrointestinal: Negative.   Genitourinary: Negative.   Musculoskeletal: Negative.   Skin: Negative.   Neurological: Negative.   Psychiatric/Behavioral: Negative.     Blood pressure 115/60, pulse 69, temperature 97.5 F (36.4 C), temperature source Oral, resp. rate 16, weight 62.143 kg (137 lb), SpO2 93 %. Physical Exam  Constitutional: She is oriented to person, place, and time. She appears well-developed and well-nourished.  HENT:  Head: Normocephalic and atraumatic.  Eyes: Conjunctivae and EOM are normal. Pupils are equal, round, and reactive to light.  Respiratory: Effort normal.    GI: Soft. She exhibits no distension.  Neurological: She is alert and oriented to person, place, and time.  Skin: Skin is warm.  Psychiatric: She has a normal mood and affect. Her behavior is normal. Thought content normal.     Assessment/Plan Continue debridement with Acell and VAC placement.  Wallace Going, DO 12/21/2015, 4:22 PM

## 2015-12-21 NOTE — Anesthesia Postprocedure Evaluation (Signed)
Anesthesia Post Note  Patient: [name]  Procedure(s) Performed: Procedure(s) (LRB): IRRIGATION AND DEBRIDEMENT right chest wall WOUND (Right) APPLICATION OF WOUND VAC to right chest wall  (Right) APPLICATION OF A-CELL OF RIGHT CHEST (Right)  Patient location during evaluation: PACU Anesthesia Type: General Level of consciousness: awake Pain management: pain level controlled Vital Signs Assessment: post-procedure vital signs reviewed and stable Respiratory status: spontaneous breathing Cardiovascular status: stable Anesthetic complications: no    Last Vitals:  Filed Vitals:   12/21/15 1320 12/21/15 1335  BP: 119/52 115/60  Pulse: 58 69  Temp: 36.4 C   Resp: 17 16    Last Pain:  Filed Vitals:   12/21/15 1335  PainSc: 0-No pain                 EDWARDS,Vander Kueker

## 2015-12-21 NOTE — Transfer of Care (Signed)
Immediate Anesthesia Transfer of Care Note  Patient: LIZZY SHEPHEARD  Procedure(s) Performed: Procedure(s) with comments: IRRIGATION AND DEBRIDEMENT right chest wall WOUND (Right) - right chest wall  APPLICATION OF WOUND VAC to right chest wall  (Right) APPLICATION OF A-CELL OF RIGHT CHEST (Right)  Patient Location: PACU  Anesthesia Type:General  Level of Consciousness: awake, alert  and oriented  Airway & Oxygen Therapy: Patient Spontanous Breathing and Patient connected to nasal cannula oxygen  Post-op Assessment: Report given to RN and Post -op Vital signs reviewed and stable  Post vital signs: Reviewed and stable  Last Vitals:  Filed Vitals:   12/21/15 0846  BP: 128/64  Pulse: 89  Temp: 36.4 C  Resp: 16    Complications: No apparent anesthesia complications

## 2015-12-21 NOTE — Anesthesia Procedure Notes (Signed)
Procedure Name: LMA Insertion Date/Time: 12/21/2015 11:42 AM Performed by: Ollen Bowl Pre-anesthesia Checklist: Patient identified, Emergency Drugs available, Suction available, Patient being monitored and Timeout performed Patient Re-evaluated:Patient Re-evaluated prior to inductionOxygen Delivery Method: Circle system utilized and Simple face mask Preoxygenation: Pre-oxygenation with 100% oxygen Intubation Type: IV induction Ventilation: Mask ventilation without difficulty LMA: LMA inserted LMA Size: 4.0 Number of attempts: 1 Airway Equipment and Method: Patient positioned with wedge pillow Placement Confirmation: positive ETCO2 and breath sounds checked- equal and bilateral Tube secured with: Tape Dental Injury: Teeth and Oropharynx as per pre-operative assessment

## 2015-12-21 NOTE — Op Note (Signed)
Operative Note   DATE OF OPERATION: 12/21/2015  LOCATION:  [facility]  SURGICAL DIVISION: Plastic Surgery  PREOPERATIVE DIAGNOSES:  Chest Wall Defect  6 x 10 cm  POSTOPERATIVE DIAGNOSES:  same  PROCEDURE:  Preparation of chest wall defect for placement of Acell (7 x 10 cm sheet and 1 gm powder) and VAC.  SURGEON: Neidy Guerrieri Sanger Marx Doig, DO  ANESTHESIA:  General.   COMPLICATIONS: None.   INDICATIONS FOR PROCEDURE:  The patient, [name] is a 81 y.o. female born on 03/27/1935, is here for treatment of a chest wall defect.  She underwent a valve replacement and CABG.  She had wound breakdown at the sternal incision site that healed and then had inferior and right sided chest / cartilage breakdown.  This is likely due to the radiation she received for treatment of her right breast cancer. She underwent a latissimus muscle flap and had superficial loss. MRN: [id number]  CONSENT:  Informed consent was obtained directly from the patient. Risks, benefits and alternatives were fully discussed. Specific risks including but not limited to bleeding, infection, hematoma, seroma, scarring, pain, infection, contracture, asymmetry, wound healing problems, and need for further surgery were all discussed. The patient did have an ample opportunity to have questions answered to satisfaction.   DESCRIPTION OF PROCEDURE:  The patient was taken to the operating room. SCDs were placed and IV antibiotics were given. The patient's operative site was prepped and draped in a sterile fashion. A time out was performed and all information was confirmed to be correct.  General anesthesia was administered.  The area was evaluated for blood supply and flow.  The fat layer 2 x 4 cm was debrided with a #10 blade and there was healthy bleeding. The area was irrigated with antibiotic solution.  The Acell powder (1 gm) and sheet (7 x 10 cm) was applied and secured with 5-0 Vicryl.  The sorbac was applied  and secured with a 4-0 Silk.  KY gel and the VAC was placed.  There was an excellent seal.  The patient tolerated the procedure well.  There were no complications. The patient was allowed to wake from anesthesia, extubated and taken to the recovery room in satisfactory condition.

## 2015-12-26 ENCOUNTER — Encounter (HOSPITAL_COMMUNITY): Payer: Self-pay | Admitting: Plastic Surgery

## 2015-12-26 LAB — ACID FAST CULTURE WITH REFLEXED SENSITIVITIES: ACID FAST CULTURE - AFSCU3: NEGATIVE

## 2015-12-26 LAB — ACID FAST CULTURE WITH REFLEXED SENSITIVITIES (MYCOBACTERIA)

## 2015-12-29 ENCOUNTER — Ambulatory Visit (INDEPENDENT_AMBULATORY_CARE_PROVIDER_SITE_OTHER): Payer: Medicare Other

## 2015-12-29 DIAGNOSIS — I4891 Unspecified atrial fibrillation: Secondary | ICD-10-CM

## 2015-12-29 DIAGNOSIS — Z953 Presence of xenogenic heart valve: Secondary | ICD-10-CM

## 2015-12-29 DIAGNOSIS — I340 Nonrheumatic mitral (valve) insufficiency: Secondary | ICD-10-CM

## 2015-12-29 LAB — POCT INR: INR: 1.6

## 2015-12-31 ENCOUNTER — Encounter: Payer: Self-pay | Admitting: *Deleted

## 2015-12-31 DIAGNOSIS — Z952 Presence of prosthetic heart valve: Secondary | ICD-10-CM

## 2015-12-31 DIAGNOSIS — Z951 Presence of aortocoronary bypass graft: Secondary | ICD-10-CM

## 2015-12-31 NOTE — Progress Notes (Signed)
Cardiac Individual Treatment Plan  Patient Details  Name: [name] MRN: [id number] Date of Birth: 08-20-1935 Referring Provider:    Initial Encounter Date:       Cardiac Rehab from 07/03/2015 in [facility] Cardiac and Pulmonary Rehab   Date  07/03/15      Visit Diagnosis: S/P MVR (mitral valve replacement)  S/P CABG x 1  Patient's Home Medications on Admission:  Current outpatient prescriptions:  .  acetaminophen (TYLENOL) 325 MG tablet, Take 2 tablets (650 mg total) by mouth every 4 (four) hours as needed for mild pain (or Fever >/= 101)., Disp: , Rfl:  .  aspirin EC 81 MG EC tablet, Take 1 tablet (81 mg total) by mouth daily., Disp: , Rfl:  .  bisoprolol (ZEBETA) 10 MG tablet, Take 1 tablet (10 mg total) by mouth daily., Disp: 30 tablet, Rfl: 5 .  fluticasone (FLONASE) 50 MCG/ACT nasal spray, Place 1 spray into both nostrils daily as needed for allergies. , Disp: , Rfl:  .  furosemide (LASIX) 20 MG tablet, Take 20 mg by mouth 2 (two) times daily. , Disp: , Rfl:  .  gabapentin (NEURONTIN) 100 MG capsule, Take 100 mg by mouth 2 (two) times daily. , Disp: , Rfl: 1 .  ipratropium-albuterol (DUONEB) 0.5-2.5 (3) MG/3ML SOLN, Take 3 mLs by nebulization every 6 (six) hours as needed (for shortness of breath)., Disp: 360 mL, Rfl: 10 .  levothyroxine (SYNTHROID, LEVOTHROID) 88 MCG tablet, Take 88 mcg by mouth daily before breakfast., Disp: , Rfl:  .  losartan (COZAAR) 100 MG tablet, Take 1 tablet (100 mg total) by mouth daily., Disp: 30 tablet, Rfl: 3 .  pantoprazole (PROTONIX) 40 MG tablet, Take 40 mg by mouth 2 (two) times daily. , Disp: , Rfl:  .  potassium chloride SA (K-DUR,KLOR-CON) 20 MEQ tablet, Take 20 mEq by mouth daily., Disp: , Rfl: 2 .  simvastatin (ZOCOR) 20 MG tablet, Take 20 mg by mouth daily., Disp: , Rfl:  .  spironolactone (ALDACTONE) 25 MG tablet, Take 1 tablet (25 mg total) by mouth daily., Disp: 30 tablet, Rfl: 6 .  traMADol (ULTRAM) 50 MG tablet, Take 1-2 tablets (50-100  mg total) by mouth every 6 (six) hours as needed (mild pain)., Disp: 30 tablet, Rfl: 0 .  traMADol (ULTRAM) 50 MG tablet, Take 1 tablet (50 mg total) by mouth every 12 (twelve) hours as needed., Disp: 30 tablet, Rfl: 0 .  warfarin (COUMADIN) 2.5 MG tablet, TAKE 1 TABLET BY MOUTH AS DIRECTED (Patient taking differently: 2.5 mg on Mon, Tue, Thurs, Sat, 3.75 mg on Wed, Fri, and Sun), Disp: 30 tablet, Rfl: 2  Past Medical History: Past Medical History  Diagnosis Date  . Hypertension   . COPD (chronic obstructive pulmonary disease) ([place])   . Hyperlipidemia   . Meniere disease   . PAF (paroxysmal atrial fibrillation) ([place])   . COPD (chronic obstructive pulmonary disease) ([place])   . Hypothyroidism   . Pulmonary hypertension ([place])   . Chronic diastolic CHF (congestive heart failure) ([place])   . PONV (postoperative nausea and vomiting)   . Anxiety   . GERD (gastroesophageal reflux disease)   . Severe Mitral Regurgitation s/p MV Repair     a. 04/2015 s/p 27 mm [facility] Mitral bovine bioprosthetic tissue valve  . Tricuspid Regurgitation s/p Repair     a. 04/2015 s/p 26 mm [facility] mc3 ring annuloplasty  . 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 04/2015 Complete  bilateral atrial lesion set using cryothermy and bipolar radiofrequency ablation with clipping of LA appendage  . CAD s/p CABG x 1     a. 04/2015 LIMA to LAD  . Post-surgical complete heart block, symptomatic     a. 04/2015 s/p MDT [id number] Advisa DR MRI DC PPM.  . Wound infection ([place]) 10/13/2015    Superficial sternal wound infection  . Cancer of right breast ([place]) 08/09/2001  . Presence of permanent cardiac pacemaker   . On home oxygen therapy     "2L at night" (11/10/2015)  . Pneumonia ~ 2010  . Arthritis     "hands" (11/10/2015)  . Surgical wound, non healing - chest wall 11/10/2015    Tobacco Use: History  Smoking status  . Former Smoker -- 1.50 packs/day for 40 years  . Types: Cigarettes  . Quit date: 04/20/1998  Smokeless  tobacco  . Never Used    Labs: Recent Review Flowsheet Data    Labs for ITP Cardiac and Pulmonary Rehab Latest Ref Rng 04/28/2015 04/29/2015 04/30/2015 11/13/2015 11/14/2015   PHART 7.350 - 7.450 - - - 7.428 7.420   PCO2ART 35.0 - 45.0 mmHg - - - 41.1 49.4(H)   HCO3 20.0 - 24.0 mEq/L - - - 26.7(H) 31.5(H)   TCO2 0 - 100 mmol/L - 34 34 27.9 33.0   O2SAT - 65.3 61.7 - 95.2 98.4       Exercise Target Goals:    Exercise Program Goal: Individual exercise prescription set with THRR, safety & activity barriers. Participant demonstrates ability to understand and report RPE using BORG scale, to self-measure pulse accurately, and to acknowledge the importance of the exercise prescription.  Exercise Prescription Goal: Starting with aerobic activity 30 plus minutes a day, 3 days per week for initial exercise prescription. Provide home exercise prescription and guidelines that participant acknowledges understanding prior to discharge.  Activity Barriers & Risk Stratification:   6 Minute Walk:   Initial Exercise Prescription:   Perform Capillary Blood Glucose checks as needed.  Exercise Prescription Changes:     Exercise Prescription Changes      07/11/15 0600 07/21/15 0900 07/26/15 0800 08/07/15 1500 08/09/15 0750   Exercise Review   Progression No  Has not started program yet; med review was 07/03/15 No  Has not started program yet; med review was 07/03/15 Yes  Has not started program yet; med review was 07/03/15 Yes No   Response to Exercise   Blood Pressure (Admit)    108/62 mmHg 116/64 mmHg   Blood Pressure (Exercise)     118/64 mmHg   Blood Pressure (Exit)    112/70 mmHg 108/56 mmHg   Heart Rate (Admit)    76 bpm 76 bpm   Heart Rate (Exercise)    101 bpm 105 bpm   Heart Rate (Exit)    71 bpm 73 bpm   Rating of Perceived Exertion (Exercise)    13 13   Symptoms    None None   Comments    Shaindel is getting stronger and is especially encouraged on the treadmill. She can now walk for  20 minutes without stopping. She can walk into exercise class without oxygen now too and she was very proud of that.  Latravia has been out for almost a month and will continue to be out due to illness and medical reasons. No more exercise progression will be made and we will meet with her individually to adjust workloads based on her exercise capacity when she returns  Duration    Progress to 50 minutes of aerobic without signs/symptoms of physical distress Progress to 50 minutes of aerobic without signs/symptoms of physical distress   Intensity    Rest + 30 Rest + 30   Progression   Progression    Continue progressive overload as per policy without signs/symptoms or physical distress. Continue progressive overload as per policy without signs/symptoms or physical distress.   Resistance Training   Training Prescription (read-only)   Yes Yes Yes   Weight (read-only)   2 2 2    Reps (read-only)   10-15 10-15 10-15   Treadmill   MPH (read-only)    2 2   Grade (read-only)    0 0   Minutes (read-only)    20 20   REL-XR   Level (read-only)  3 5 5 5    Watts (read-only)  28 45 6 45   Minutes (read-only)    15 15     09/26/15 0800 10/23/15 1100 11/20/15 1200       Exercise Review   Progression No  Absent since last review; last visit 08/09/15 No  Absent since last review; last visit 08/09/15 No  Absent since last review; last visit 08/09/15     Response to Exercise   Comments Ex. Rx. will be re evaluated upon return due to extended absence Ex. Rx. will be re evaluated upon return due to extended absence Ex. Rx. will be re evaluated upon return due to extended absence     Duration Progress to 50 minutes of aerobic without signs/symptoms of physical distress Progress to 50 minutes of aerobic without signs/symptoms of physical distress Progress to 50 minutes of aerobic without signs/symptoms of physical distress     Intensity Rest + 30 Rest + 30 Rest + 30     Progression   Progression Continue  progressive overload as per policy without signs/symptoms or physical distress. Continue progressive overload as per policy without signs/symptoms or physical distress. Continue progressive overload as per policy without signs/symptoms or physical distress.     Resistance Training   Training Prescription (read-only) Yes Yes      Weight (read-only) 2 2      Reps (read-only) 10-15 10-15      Treadmill   MPH (read-only) 2 2      Grade (read-only) 0 0      Minutes (read-only) 20 20      REL-XR   Level (read-only) 5 5      Watts (read-only) 45 45      Minutes (read-only) 15 15         Exercise Comments:     Exercise Comments      12/27/15 0654           Exercise Comments The patient has not attended class since the last review. There has been no progress made due to lack of attendance. Workloads may need to be adjusted if the patient returns to class.           Discharge Exercise Prescription (Final Exercise Prescription Changes):     Exercise Prescription Changes - 11/20/15 1200    Exercise Review   Progression No  Absent since last review; last visit 08/09/15   Response to Exercise   Comments Ex. Rx. will be re evaluated upon return due to extended absence   Duration Progress to 50 minutes of aerobic without signs/symptoms of physical distress   Intensity Rest + 30   Progression   Progression Continue progressive  overload as per policy without signs/symptoms or physical distress.      Nutrition:  Target Goals: Understanding of nutrition guidelines, daily intake of sodium <1563m, cholesterol <2074m calories 30% from fat and 7% or less from saturated fats, daily to have 5 or more servings of fruits and vegetables.  Biometrics:    Nutrition Therapy Plan and Nutrition Goals:   Nutrition Discharge: Rate Your Plate Scores:   Nutrition Goals Re-Evaluation:     Nutrition Goals Re-Evaluation      11/27/15 1359           Personal Goal #1 Re-Evaluation    Personal Goal #1 NoJeslynas been out since 08/09/2015 due to wound debridement. Noted that NoKikuyeecently had a Irrigation and debridment of her chest wall per another dept EpIC charting.        Goal Progress Seen No          Psychosocial: Target Goals: Acknowledge presence or absence of depression, maximize coping skills, provide positive support system. Participant is able to verbalize types and ability to use techniques and skills needed for reducing stress and depression.  Initial Review & Psychosocial Screening:   Quality of Life Scores:   PHQ-9:     Recent Review Flowsheet Data    [facility]   Decreased Interest 0   Down, Depressed, Hopeless 0   PHQ - 2 Score 0   Altered sleeping 0   Tired, decreased energy 3   Change in appetite 0   Feeling bad or failure about yourself  0   Trouble concentrating 0   Moving slowly or fidgety/restless 0   Suicidal thoughts 0   PHQ-9 Score 3   Difficult doing work/chores Somewhat difficult      Psychosocial Evaluation and Intervention:     Psychosocial Evaluation - 07/12/15 0936    Psychosocial Evaluation & Interventions   Interventions Stress management education;Encouraged to exercise with the program and follow exercise prescription;Relaxation education   Comments Counselor met with Ms. Tisdell today for initial psychosocial evaluation.  It is her first day in Cardiac Rehab and she was energetic and working hard already!  Ms. MuMizrachis a [id number] old who had open heart surgery in August followed by subsequent procedures and an infection resulting in 5 weeks in the hospital.  She has a strong support system with a spouse of [id number] and several siblings who live closeby.  Ms. MuDuraeports that she sleeps well and typically has a good appetite.  She denies a history  of depression or anxiety or any current symptoms.  She states her mood is generally positive and her doctor has released her to return to "normal  activities" with pacing herself.  Ms. MuLegaultas goals in this program to increase her strength and stamina and to improve her balance - as she occasionally has to use a walker.  She will benefit from the consistent exercise aspect of this program.     Continued Psychosocial Services Needed No      Psychosocial Re-Evaluation:     Psychosocial Re-Evaluation      07/24/15 0940 11/13/15 1422 11/27/15 1425       Psychosocial Re-Evaluation   Comments Counselor follow up with Ms. Hass today reporting she has better balance now and is no longer using her walker at home at all.  She brings it to this program just to carry her oxygen tank.  She states she is experiencing more stamina as well and able  to walk and move more and for longer periods of time.  She continues to sleep well, eat well and have a positive attitude.  Counselor commended Ms. Semper for all her hard work and progress made so far.   I left vm for Elyn since she called on 11/06/2015 to see if she could return. I read EPIC note from today that mentioned an Infection and Wound debridement so I suggested she return after things get better for her. I called Laquetta's home and her husband answered. He said she just got out of [facility] for another debridment of her chest wall.         Vocational Rehabilitation: Provide vocational rehab assistance to qualifying candidates.   Vocational Rehab Evaluation & Intervention:   Education: Education Goals: Education classes will be provided on a weekly basis, covering required topics. Participant will state understanding/return demonstration of topics presented.  Learning Barriers/Preferences:   Education Topics: General Nutrition Guidelines/Fats and Fiber: -Group instruction provided by verbal, written material, models and posters to present the general guidelines for heart healthy nutrition. Gives an explanation and review of dietary fats and fiber.   Controlling Sodium/Reading  Food Labels: -Group verbal and written material supporting the discussion of sodium use in heart healthy nutrition. Review and explanation with models, verbal and written materials for utilization of the food label.   Exercise Physiology & Risk Factors: - Group verbal and written instruction with models to review the exercise physiology of the cardiovascular system and associated critical values. Details cardiovascular disease risk factors and the goals associated with each risk factor.          Cardiac Rehab from 08/09/2015 in [facility] Cardiac and Pulmonary Rehab   Date  07/12/15   Educator  RM   Instruction Review Code  2- meets goals/outcomes      Aerobic Exercise & Resistance Training: - Gives group verbal and written discussion on the health impact of inactivity. On the components of aerobic and resistive training programs and the benefits of this training and how to safely progress through these programs.      Cardiac Rehab from 08/09/2015 in [facility] Cardiac and Pulmonary Rehab   Date  07/17/15   Educator  RM   Instruction Review Code  2- meets goals/outcomes      Flexibility, Balance, General Exercise Guidelines: - Provides group verbal and written instruction on the benefits of flexibility and balance training programs. Provides general exercise guidelines with specific guidelines to those with heart or lung disease. Demonstration and skill practice provided.      Cardiac Rehab from 08/09/2015 in [facility] Cardiac and Pulmonary Rehab   Date  07/24/15   Educator  RM   Instruction Review Code  2- meets goals/outcomes      Stress Management: - Provides group verbal and written instruction about the health risks of elevated stress, cause of high stress, and healthy ways to reduce stress.      Cardiac Rehab from 08/09/2015 in [facility] Cardiac and Pulmonary Rehab   Date  07/26/15   Educator  [facility]   Instruction Review Code  2- meets goals/outcomes      Depression: - Provides group verbal  and written instruction on the correlation between heart/lung disease and depressed mood, treatment options, and the stigmas associated with seeking treatment.   Anatomy & Physiology of the Heart: - Group verbal and written instruction and models provide basic cardiac anatomy and physiology, with the coronary electrical and arterial systems. Review of: AMI,  Angina, Valve disease, Heart Failure, Cardiac Arrhythmia, Pacemakers, and the ICD.   Cardiac Procedures: - Group verbal and written instruction and models to describe the testing methods done to diagnose heart disease. Reviews the outcomes of the test results. Describes the treatment choices: Medical Management, Angioplasty, or Coronary Bypass Surgery.   Cardiac Medications: - Group verbal and written instruction to review commonly prescribed medications for heart disease. Reviews the medication, class of the drug, and side effects. Includes the steps to properly store meds and maintain the prescription regimen.   Go Sex-Intimacy & Heart Disease, Get SMART - Goal Setting: - Group verbal and written instruction through game format to discuss heart disease and the return to sexual intimacy. Provides group verbal and written material to discuss and apply goal setting through the application of the S.M.A.R.T. Method.   Other Matters of the Heart: - Provides group verbal, written materials and models to describe Heart Failure, Angina, Valve Disease, and Diabetes in the realm of heart disease. Includes description of the disease process and treatment options available to the cardiac patient.      Cardiac Rehab from 08/09/2015 in [facility] Cardiac and Pulmonary Rehab   Date  08/09/15   Educator  SB   Instruction Review Code  2- meets goals/outcomes      Exercise & Equipment Safety: - Individual verbal instruction and demonstration of equipment use and safety with use of the equipment.      Cardiac Rehab from 08/09/2015 in [facility] Cardiac and  Pulmonary Rehab   Date  07/03/15   Educator  SB   Instruction Review Code  2- meets goals/outcomes      Infection Prevention: - Provides verbal and written material to individual with discussion of infection control including proper hand washing and proper equipment cleaning during exercise session.      Cardiac Rehab from 08/09/2015 in [facility] Cardiac and Pulmonary Rehab   Date  07/03/15   Educator  Sb   Instruction Review Code  2- meets goals/outcomes      Falls Prevention: - Provides verbal and written material to individual with discussion of falls prevention and safety.      Cardiac Rehab from 08/09/2015 in [facility] Cardiac and Pulmonary Rehab   Date  07/03/15   Educator  Sb   Instruction Review Code  2- meets goals/outcomes      Diabetes: - Individual verbal and written instruction to review signs/symptoms of diabetes, desired ranges of glucose level fasting, after meals and with exercise. Advice that pre and post exercise glucose checks will be done for 3 sessions at entry of program.    Knowledge Questionnaire Score:   Core Components/Risk Factors/Patient Goals at Admission:   Core Components/Risk Factors/Patient Goals Review:      Goals and Risk Factor Review      07/17/15 1007 08/02/15 1511 09/26/15 0826 10/23/15 1108 11/13/15 1422   Core Components/Risk Factors/Patient Goals Review   Personal Goals Review Increase Aerobic Exercise and Physical Activity Increase Aerobic Exercise and Physical Activity;Hypertension;Lipids      Review     I left vm for Almer since she called on 11/06/2015 to see if she could return. I read EPIC note from today that mentioned an Infection and Wound debridement so I suggested she return after things get better for her.   Increase Aerobic Exercise and Physical Activity (read-only)   Goals Progress/Improvement seen   Yes No No    Comments Patient discussed her desire to get back to "normal" and was  talking about some of the exercise she used  to do before he heart episode. She said that after her first week of class she was a little sore. We discussed that that can be a normal response of the body when a new activity is preformed, and how over time the soreness could go away once he muscles get stronger and used to the new activity. Education on cross training/ the body's response to new activities was given.  Quaneisha stated today she can bend down and tie her shoes.  Today she was having a flare of her bursitis in her leg. No treadmill time because of the bursitis pain.  She is walking more at home and her husband bought her a cane to help her stay balanced. We talked about the distance it is to walk from the hosptal entrance to the rehab gym. Her husband wheels her down. Her goal is to be able to walk from the class to the front door without wheelchair assistance by Dec 19th.  Ex. Rx. will be re-evaluated upon return due to extende absence Ex. Rx. will be re-evaluated upon return due to extende absence    Hypertension (read-only)   Progress seen toward goals  Yes      Comments  Tru's BP remains in normal range. She will see RD in Dec on the 19th to review nutrition plan.       Abnormal Lipids (read-only)   Progress seen towards goals  Unknown      Comments  No labs to review. Kaleea will have an appointment with the RD in December.         11/27/15 1401 11/27/15 1424         Core Components/Risk Factors/Patient Goals Review   Personal Goals Review Sedentary Sedentary      Review Hanifah has been out since 08/09/2015 due to wound debridement. Noted that Zykia recently had a Irrigation and debridment of her chest wall per another dept EpIC charting.  I called Marget's home and her husband answered. He said she just got out of [facility] for another debridment of her chest wall.       Expected Outcomes Clear of infection to be able to exercise.           Core Components/Risk Factors/Patient Goals at Discharge (Final Review):       Goals and Risk Factor Review - 11/27/15 1424    Core Components/Risk Factors/Patient Goals Review   Personal Goals Review Sedentary   Review I called Everli's home and her husband answered. He said she just got out of [facility] for another debridment of her chest wall.       ITP Comments:     ITP Comments      08/14/15 1211 09/10/15 1215 10/05/15 1131 10/31/15 0751 11/13/15 1422   ITP Comments 30 day review preparation: Continue with ITP. Ready for 30 day review.  Continue with ITP  Remains out with medical concerns.  Has appoinment mid JAn with cardiologist regarding pacemaker qustions. Ready for 30 day review. Continue with ITP. Remains out with medical reasons 30 day review.  Continue with ITP. Continues to remain out for medical reasons I left vm for Emilia since she called on 11/06/2015 to see if she could return. I read EPIC note from today that mentioned an Infection and Wound debridement so I suggested she return after things get better for her.     11/30/15 1244 12/31/15 7425  ITP Comments 30 Day Review. Continue with the ITP. 30 day review.  Continue with ITP   remains out with medical concerns         Comments:

## 2016-01-01 LAB — ACID FAST CULTURE WITH REFLEXED SENSITIVITIES

## 2016-01-01 LAB — ACID FAST CULTURE WITH REFLEXED SENSITIVITIES (MYCOBACTERIA): Acid Fast Culture: NEGATIVE

## 2016-01-02 ENCOUNTER — Ambulatory Visit (INDEPENDENT_AMBULATORY_CARE_PROVIDER_SITE_OTHER): Payer: Medicare Other | Admitting: *Deleted

## 2016-01-02 DIAGNOSIS — Z950 Presence of cardiac pacemaker: Secondary | ICD-10-CM

## 2016-01-02 DIAGNOSIS — I442 Atrioventricular block, complete: Secondary | ICD-10-CM | POA: Diagnosis not present

## 2016-01-02 NOTE — Progress Notes (Signed)
Remote pacemaker transmission.   

## 2016-01-04 ENCOUNTER — Encounter: Payer: Self-pay | Admitting: Cardiovascular Disease

## 2016-01-04 ENCOUNTER — Other Ambulatory Visit: Payer: Self-pay | Admitting: Plastic Surgery

## 2016-01-04 ENCOUNTER — Ambulatory Visit (INDEPENDENT_AMBULATORY_CARE_PROVIDER_SITE_OTHER): Payer: Medicare Other | Admitting: Cardiovascular Disease

## 2016-01-04 VITALS — BP 138/64 | HR 79 | Ht 61.5 in | Wt 131.0 lb

## 2016-01-04 DIAGNOSIS — I4891 Unspecified atrial fibrillation: Secondary | ICD-10-CM

## 2016-01-04 DIAGNOSIS — S21109A Unspecified open wound of unspecified front wall of thorax without penetration into thoracic cavity, initial encounter: Secondary | ICD-10-CM

## 2016-01-04 NOTE — Patient Instructions (Signed)
Medication Instructions: Continue same medications.   Labwork: None.   Procedures/Testing: None.   Follow-Up: 3 months with Dr. Alexiya Franqui.   Any Additional Special Instructions Will Be Listed Below (If Applicable).     If you need a refill on your cardiac medications before your next appointment, please call your pharmacy.   

## 2016-01-04 NOTE — Progress Notes (Signed)
Cardiology Office Note   Date:  01/04/2016   ID:  [name], DOB 1935-07-12, MRN [id number]  PCP:  Ezequiel Kayser, MD  Cardiologist:   Kathlyn Sacramento, MD   Chief Complaint  Patient presents with  . other    3 month f/u. Meds reviewed verbally with pt.      History of Present Illness: [name] is a 81 y.o. female who presents for  a follow-up visit regarding chronic systolic heart failure, atrial fibrillation and mitral regurgitation.  She has known history of COPD, breast cancer status post right partial mastectomy, hypothyroidism, hyperlipidemia and Mnire disease.   She underwent mitral valve replacement with a bioprosthetic valve, tricuspid valve repair, one-vessel CABG with LIMA to LAD and maze procedure in August 2016. This was complicated by sternal wound infection which required debridement. She also had complete heart block and underwent permanent pacemaker placement. She initially improved significantly but had worsening heart failure in late 2016. Echocardiogram in December 2016 showed worsening of LV systolic function with an EF of 30-35%. Ejection fraction before surgery was 50%. The mitral valve appeared intact with no significant regurgitation and minimal gradient. There was also resolution of pulmonary hypertension. I referred her to Dr. Caryl Comes who reprogrammed her pacemaker to improve intrinsic AV conduction. The patient reports immediate improvement in symptoms after making adjustments. Heart failure symptoms improved significantly but unfortunately she had recurrent sternal wound infection and currently being followed by plastic surgery and has a wound VAC in place.  Past Medical History  Diagnosis Date  . Hypertension   . COPD (chronic obstructive pulmonary disease) ([place])   . Hyperlipidemia   . Meniere disease   . PAF (paroxysmal atrial fibrillation) ([place])   . COPD (chronic obstructive pulmonary disease) ([place])   . Hypothyroidism   . Pulmonary hypertension  ([place])   . Chronic diastolic CHF (congestive heart failure) ([place])   . PONV (postoperative nausea and vomiting)   . Anxiety   . GERD (gastroesophageal reflux disease)   . Severe Mitral Regurgitation s/p MV Repair     a. 04/2015 s/p 27 mm [employer] Mitral bovine bioprosthetic tissue valve  . Tricuspid Regurgitation s/p Repair     a. 04/2015 s/p 26 mm Edwards mc3 ring annuloplasty  . 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 04/2015 Complete bilateral atrial lesion set using cryothermy and bipolar radiofrequency ablation with clipping of LA appendage  . CAD s/p CABG x 1     a. 04/2015 LIMA to LAD  . Post-surgical complete heart block, symptomatic     a. 04/2015 s/p MDT [id number] Advisa DR MRI DC PPM.  . Wound infection ([place]) 10/13/2015    Superficial sternal wound infection  . Cancer of right breast ([place]) 08/09/2001  . Presence of permanent cardiac pacemaker   . On home oxygen therapy     "2L at night" (11/10/2015)  . Pneumonia ~ 2010  . Arthritis     "hands" (11/10/2015)  . Surgical wound, non healing - chest wall 11/10/2015    Past Surgical History  Procedure Laterality Date  . Cochlear implant Left 2005?  Marland Kitchen Cardiac catheterization  11/2013    [facility]  . Cardiac catheterization  10/2014    [facility]  . Mitral valve repair N/A 04/25/2015    Procedure: MITRAL VALVE  REPLACEMENT using a 27 mm Edwards Perimount Magna Mitral Ease Valve;  Surgeon: Rexene Alberts, MD;  Location: [place];  Service: Open Heart Surgery;  Laterality: N/A;  . Tricuspid valve replacement N/A 04/25/2015    Procedure: TRICUSPID VALVE REPAIR;  Surgeon: Rexene Alberts, MD;  Location: [place];  Service: Open Heart Surgery;  Laterality: N/A;  . Maze N/A 04/25/2015    Procedure: MAZE;  Surgeon: Rexene Alberts, MD;  Location: [place];  Service: Open Heart Surgery;  Laterality: N/A;  . Tee without cardioversion N/A 04/25/2015    Procedure: TRANSESOPHAGEAL ECHOCARDIOGRAM (TEE);  Surgeon: Rexene Alberts, MD;  Location: [place];  Service: Open  Heart Surgery;  Laterality: N/A;  . Clipping of atrial appendage N/A 04/25/2015    Procedure: CLIPPING OF ATRIAL APPENDAGE;  Surgeon: Rexene Alberts, MD;  Location: [place];  Service: Open Heart Surgery;  Laterality: N/A;  . Ep implantable device N/A 05/02/2015    Procedure: Pacemaker Implant;  Surgeon: Thompson Grayer, MD;  Location: [place] [facility];  Service: Cardiovascular;  Laterality: N/A;  . Sternal wound debridement N/A 05/09/2015    Procedure: STERNAL WOUND DEBRIDEMENT;  Surgeon: Rexene Alberts, MD;  Location: [place];  Service: Thoracic;  Laterality: N/A;  . Application of wound vac N/A 05/09/2015    Procedure: APPLICATION OF WOUND VAC;  Surgeon: Rexene Alberts, MD;  Location: [place];  Service: Thoracic;  Laterality: N/A;  . Sternal wires removal N/A 06/05/2015    Procedure: STERNAL WIRES REMOVAL;  Surgeon: Rexene Alberts, MD;  Location: [place];  Service: Thoracic;  Laterality: N/A;  . Tonsillectomy    . Breast biopsy Left 11/25/06    neg  . Breast biopsy Left 01/20/12    /clip-neg  . Mastectomy, partial Right 2002  . Vaginal hysterectomy    . Dilation and curettage of uterus  "several before hysterectomy"  . Cataract extraction w/ intraocular lens  implant, bilateral Bilateral 2014  . Cardiac valve replacement    . Insert / replace / remove pacemaker    . Coronary artery bypass graft N/A 04/25/2015    Procedure: CORONARY ARTERY BYPASS GRAFTING (CABG) x ONE, using left internal mammary artery;  Surgeon: Rexene Alberts, MD;  Location: [place];  Service: Open Heart Surgery;  Laterality: N/A;  . Sternal wound debridement N/A 11/13/2015    Procedure: Excisional drainage of RIGHT Chest wall mass and breast mass ;  Surgeon: Rexene Alberts, MD;  Location: [place];  Service: Thoracic;  Laterality: N/A;  . Application of wound vac N/A 11/13/2015    Procedure: APPLICATION OF WOUND VAC;  Surgeon: Rexene Alberts, MD;  Location: [place];  Service: Thoracic;  Laterality: N/A;  . Incision and drainage of wound  N/A 11/21/2015    Procedure: IRRIGATION AND DEBRIDEMENT WOUND;  Surgeon: Loel Lofty Dillingham, DO;  Location: [place];  Service: Plastics;  Laterality: N/A;  . Application of wound vac N/A 11/21/2015    Procedure: APPLICATION OF WOUND VAC;  Surgeon: Loel Lofty Dillingham, DO;  Location: [place];  Service: Plastics;  Laterality: N/A;  . Application of a-cell of chest/abdomen N/A 11/21/2015    Procedure: APPLICATION OF A-CELL OF CHEST/ABDOMEN;  Surgeon: Loel Lofty Dillingham, DO;  Location: [place];  Service: Plastics;  Laterality: N/A;  . Tram Right 11/18/2015    Procedure: VRAM Vertical Rectus Abdominus Muscle Flap;  Surgeon: Loel Lofty Dillingham, DO;  Location: [place];  Service: Plastics;  Laterality: Right;  RIght Back  . I&d extremity Right 12/06/2015    Procedure: IRRIGATION AND DEBRIDEMENT RIGHT CHEST WALL WITH ACELL PLACEMENT AND VAC;  Surgeon: Loel Lofty Dillingham,  DO;  Location: [place];  Service: Plastics;  Laterality: Right;  . Incision and drainage of wound Right 12/21/2015    Procedure: IRRIGATION AND DEBRIDEMENT right chest wall WOUND;  Surgeon: Loel Lofty Dillingham, DO;  Location: [place];  Service: Plastics;  Laterality: Right;  right chest wall   . Application of wound vac Right 12/21/2015    Procedure: APPLICATION OF WOUND VAC to right chest wall ;  Surgeon: Loel Lofty Dillingham, DO;  Location: [place];  Service: Plastics;  Laterality: Right;  . Application of a-cell of chest/abdomen Right 12/21/2015    Procedure: APPLICATION OF A-CELL OF RIGHT CHEST;  Surgeon: Loel Lofty Dillingham, DO;  Location: [place];  Service: Plastics;  Laterality: Right;     Current Outpatient Prescriptions  Medication Sig Dispense Refill  . acetaminophen (TYLENOL) 325 MG tablet Take 2 tablets (650 mg total) by mouth every 4 (four) hours as needed for mild pain (or Fever >/= 101).    Marland Kitchen aspirin EC 81 MG EC tablet Take 1 tablet (81 mg total) by mouth daily.    . bisoprolol (ZEBETA) 10 MG tablet Take 1 tablet (10 mg total) by mouth  daily. 30 tablet 5  . fluticasone (FLONASE) 50 MCG/ACT nasal spray Place 1 spray into both nostrils daily as needed for allergies.     . furosemide (LASIX) 20 MG tablet Take 20 mg by mouth 2 (two) times daily.     Marland Kitchen gabapentin (NEURONTIN) 100 MG capsule Take 100 mg by mouth 2 (two) times daily.   1  . ipratropium-albuterol (DUONEB) 0.5-2.5 (3) MG/3ML SOLN Take 3 mLs by nebulization every 6 (six) hours as needed (for shortness of breath). 360 mL 10  . levothyroxine (SYNTHROID, LEVOTHROID) 88 MCG tablet Take 88 mcg by mouth daily before breakfast.    . losartan (COZAAR) 100 MG tablet Take 1 tablet (100 mg total) by mouth daily. 30 tablet 3  . pantoprazole (PROTONIX) 40 MG tablet Take 40 mg by mouth 2 (two) times daily.     . potassium chloride SA (K-DUR,KLOR-CON) 20 MEQ tablet Take 20 mEq by mouth daily.  2  . simvastatin (ZOCOR) 20 MG tablet Take 20 mg by mouth daily.    Marland Kitchen spironolactone (ALDACTONE) 25 MG tablet Take 1 tablet (25 mg total) by mouth daily. 30 tablet 6  . traMADol (ULTRAM) 50 MG tablet Take 1 tablet (50 mg total) by mouth every 12 (twelve) hours as needed. 30 tablet 0  . warfarin (COUMADIN) 2.5 MG tablet TAKE 1 TABLET BY MOUTH AS DIRECTED (Patient taking differently: 2.5 mg on Mon, Tue, Thurs, Sat, 3.75 mg on Wed, Fri, and Sun) 30 tablet 2   No current facility-administered medications for this visit.    Allergies:   Ace inhibitors; Augmentin; Penicillins; Nifedipine; Propranolol; Diazepam; Meperidine; and Propoxyphene    Social History:  The patient  reports that she quit smoking about 17 years ago. Her smoking use included Cigarettes. She has a 60 pack-year smoking history. She has never used smokeless tobacco. She reports that she drinks alcohol. She reports that she does not use illicit drugs.   Family History:  The patient's family history includes Heart attack in her paternal uncle; Heart disease in her brother and father.    ROS:  Please see the history of present  illness.   Otherwise, review of systems are positive for none.   All other systems are reviewed and negative.    PHYSICAL EXAM: VS:  BP 138/64 mmHg  Pulse 79  Ht 5' 1.5" (1.562 m)  Wt 131 lb (59.421 kg)  BMI 24.35 kg/m2 , BMI Body mass index is 24.35 kg/(m^2). GEN: Well nourished, well developed, in no acute distress HEENT: normal Neck: no JVD, carotid bruits, or masses Cardiac: RRR; no murmurs, rubs, or gallops,no edema  Respiratory:  clear to auscultation bilaterally, normal work of breathing GI: soft, nontender, nondistended, + BS MS: no deformity or atrophy Skin: warm and dry, no rash Neuro:  Strength and sensation are intact Psych: euthymic mood, full affect   EKG:  EKG is ordered today. The ekg ordered today demonstrates atrial paced rhythm with ventricular demand pacemaker   Recent Labs: 04/26/2015: Magnesium 2.5* 05/01/2015: TSH 6.238* 10/03/2015: BNP CANCELED 11/15/2015: ALT 18 12/21/2015: BUN 16; Creatinine, Ser 0.81; Hemoglobin 10.5*; Platelets 235; Potassium 4.1; Sodium 141    Lipid Panel No results found for: CHOL, TRIG, HDL, CHOLHDL, VLDL, LDLCALC, LDLDIRECT    Wt Readings from Last 3 Encounters:  01/04/16 131 lb (59.421 kg)  12/21/15 137 lb (62.143 kg)  12/06/15 137 lb (62.143 kg)       ASSESSMENT AND PLAN:  1.  Chronic systolic heart failure: She had significant improvement in symptoms after adjusting her pacemaker settings to improve intrinsic AV conduction. There is no evidence of volume overload. Continue treatment with bisoprolol, losartan and spironolactone. She is currently on furosemide 20 mg twice daily and appears to be euvolemic. I will consider repeat echocardiogram in 3 months.  2. Status post mitral valve replacement with a bioprosthetic valve: This was intact on most recent echocardiogram.  3. Coronary artery disease status post one-vessel LIMA to LAD: No anginal symptoms  4. Persistent atrial fibrillation: No evidence of recurrent atrial  fibrillation since her surgery and even after stopping amiodarone. Nonetheless, I'm going to continue warfarin for now until we reevaluate her LV systolic function.   Disposition:   FU with me in 3 months  Signed,  Kathlyn Sacramento, MD  01/04/2016 10:21 AM    Progress

## 2016-01-05 ENCOUNTER — Ambulatory Visit (INDEPENDENT_AMBULATORY_CARE_PROVIDER_SITE_OTHER): Payer: Medicare Other | Admitting: Internal Medicine

## 2016-01-05 DIAGNOSIS — Z953 Presence of xenogenic heart valve: Secondary | ICD-10-CM

## 2016-01-05 DIAGNOSIS — I4891 Unspecified atrial fibrillation: Secondary | ICD-10-CM

## 2016-01-05 DIAGNOSIS — I340 Nonrheumatic mitral (valve) insufficiency: Secondary | ICD-10-CM

## 2016-01-05 LAB — POCT INR: INR: 2.4

## 2016-01-09 ENCOUNTER — Encounter (HOSPITAL_COMMUNITY): Payer: Self-pay | Admitting: *Deleted

## 2016-01-09 NOTE — Progress Notes (Signed)
Spoke with pt for pre-op call. She states nothing has changed with her medical and surgical history. Did have a change with her Coumadin dose and it's been noted in her med list. She states she has not been instructed on stopping Coumadin for this surgery. She states she usually stops the day before surgery, day of surgery and the starts back the day after. I told her that I would call Dr. Eusebio Friendly office in AM and find out the instructions and will call her back after I find out. She voiced understanding.

## 2016-01-10 ENCOUNTER — Ambulatory Visit (INDEPENDENT_AMBULATORY_CARE_PROVIDER_SITE_OTHER): Payer: Medicare Other | Admitting: Cardiology

## 2016-01-10 DIAGNOSIS — Z953 Presence of xenogenic heart valve: Secondary | ICD-10-CM

## 2016-01-10 DIAGNOSIS — I340 Nonrheumatic mitral (valve) insufficiency: Secondary | ICD-10-CM

## 2016-01-10 DIAGNOSIS — I4891 Unspecified atrial fibrillation: Secondary | ICD-10-CM

## 2016-01-10 LAB — POCT INR: INR: 2.5

## 2016-01-10 NOTE — Progress Notes (Signed)
Called Dr. Eusebio Friendly office this AM and spoke with Maudie Mercury. She states she spoke with Dr. Eusebio Friendly PA and pt is to hold Coumadin today and tomorrow and then they would instruct pt when to start taking it again. I called pt and gave her these instructions. She voiced understanding.

## 2016-01-11 ENCOUNTER — Ambulatory Visit (HOSPITAL_COMMUNITY)
Admission: RE | Admit: 2016-01-11 | Discharge: 2016-01-11 | Disposition: A | Discharge status: Home / Self Care | Payer: Medicare Other | Source: Ambulatory Visit | Attending: Plastic Surgery | Admitting: Plastic Surgery

## 2016-01-11 ENCOUNTER — Ambulatory Visit (HOSPITAL_COMMUNITY): Payer: Medicare Other | Admitting: Anesthesiology

## 2016-01-11 ENCOUNTER — Encounter (HOSPITAL_COMMUNITY): Payer: Self-pay | Admitting: Surgery

## 2016-01-11 ENCOUNTER — Encounter (HOSPITAL_COMMUNITY)
Admission: RE | Disposition: A | Discharge status: Home / Self Care | Payer: Self-pay | Source: Ambulatory Visit | Attending: Plastic Surgery

## 2016-01-11 DIAGNOSIS — I5032 Chronic diastolic (congestive) heart failure: Secondary | ICD-10-CM | POA: Diagnosis not present

## 2016-01-11 DIAGNOSIS — I2510 Atherosclerotic heart disease of native coronary artery without angina pectoris: Secondary | ICD-10-CM | POA: Diagnosis not present

## 2016-01-11 DIAGNOSIS — Z955 Presence of coronary angioplasty implant and graft: Secondary | ICD-10-CM | POA: Insufficient documentation

## 2016-01-11 DIAGNOSIS — I110 Hypertensive heart disease with heart failure: Secondary | ICD-10-CM | POA: Diagnosis not present

## 2016-01-11 DIAGNOSIS — Z87891 Personal history of nicotine dependence: Secondary | ICD-10-CM | POA: Diagnosis not present

## 2016-01-11 DIAGNOSIS — Z853 Personal history of malignant neoplasm of breast: Secondary | ICD-10-CM | POA: Diagnosis not present

## 2016-01-11 DIAGNOSIS — Z950 Presence of cardiac pacemaker: Secondary | ICD-10-CM | POA: Insufficient documentation

## 2016-01-11 DIAGNOSIS — S21109A Unspecified open wound of unspecified front wall of thorax without penetration into thoracic cavity, initial encounter: Secondary | ICD-10-CM

## 2016-01-11 DIAGNOSIS — J449 Chronic obstructive pulmonary disease, unspecified: Secondary | ICD-10-CM | POA: Diagnosis not present

## 2016-01-11 DIAGNOSIS — E785 Hyperlipidemia, unspecified: Secondary | ICD-10-CM | POA: Diagnosis not present

## 2016-01-11 DIAGNOSIS — Z923 Personal history of irradiation: Secondary | ICD-10-CM | POA: Insufficient documentation

## 2016-01-11 DIAGNOSIS — Y838 Other surgical procedures as the cause of abnormal reaction of the patient, or of later complication, without mention of misadventure at the time of the procedure: Secondary | ICD-10-CM | POA: Diagnosis not present

## 2016-01-11 DIAGNOSIS — T8189XA Other complications of procedures, not elsewhere classified, initial encounter: Secondary | ICD-10-CM | POA: Insufficient documentation

## 2016-01-11 DIAGNOSIS — Z9981 Dependence on supplemental oxygen: Secondary | ICD-10-CM | POA: Insufficient documentation

## 2016-01-11 DIAGNOSIS — Z880 Allergy status to penicillin: Secondary | ICD-10-CM | POA: Insufficient documentation

## 2016-01-11 HISTORY — PX: APPLICATION OF A-CELL OF CHEST/ABDOMEN: SHX6302

## 2016-01-11 HISTORY — PX: APPLICATION OF WOUND VAC: SHX5189

## 2016-01-11 HISTORY — PX: IRRIGATION AND DEBRIDEMENT OF WOUND WITH SPLIT THICKNESS SKIN GRAFT: SHX5879

## 2016-01-11 LAB — BASIC METABOLIC PANEL
Anion gap: 12 (ref 5–15)
BUN: 17 mg/dL (ref 6–20)
CHLORIDE: 103 mmol/L (ref 101–111)
CO2: 24 mmol/L (ref 22–32)
CREATININE: 0.78 mg/dL (ref 0.44–1.00)
Calcium: 9.1 mg/dL (ref 8.9–10.3)
GFR calc Af Amer: >60 mL/min (ref >60)
GFR calc non Af Amer: >60 mL/min (ref >60)
Glucose, Bld: 137 mg/dL — ABNORMAL HIGH (ref 65–99)
POTASSIUM: 4.1 mmol/L (ref 3.5–5.1)
Sodium: 139 mmol/L (ref 135–145)

## 2016-01-11 LAB — CBC
HEMATOCRIT: 30.9 % — AB (ref 36.0–46.0)
Hemoglobin: 9.3 g/dL — ABNORMAL LOW (ref 12.0–15.0)
MCH: 27.5 pg (ref 26.0–34.0)
MCHC: 30.1 g/dL (ref 30.0–36.0)
MCV: 91.4 fL (ref 78.0–100.0)
Platelets: 246 10*3/uL (ref 150–400)
RBC: 3.38 MIL/uL — ABNORMAL LOW (ref 3.87–5.11)
RDW: 17.3 % — AB (ref 11.5–15.5)
WBC: 9 10*3/uL (ref 4.0–10.5)

## 2016-01-11 LAB — PROTIME-INR
INR: 2.13 — ABNORMAL HIGH (ref 0.00–1.49)
Prothrombin Time: 23.7 seconds — ABNORMAL HIGH (ref 11.6–15.2)

## 2016-01-11 SURGERY — IRRIGATION AND DEBRIDEMENT OF WOUND WITH SPLIT THICKNESS SKIN GRAFT
Anesthesia: General | Site: Chest | Laterality: Right

## 2016-01-11 MED ORDER — ARTIFICIAL TEARS OP OINT
TOPICAL_OINTMENT | OPHTHALMIC | Status: AC
  Filled 2016-01-11: qty 3.5

## 2016-01-11 MED ORDER — 0.9 % SODIUM CHLORIDE (POUR BTL) OPTIME
TOPICAL | Status: DC | PRN
  Administered 2016-01-11: 1000 mL

## 2016-01-11 MED ORDER — LACTATED RINGERS IV SOLN
INTRAVENOUS | Status: DC | PRN
  Administered 2016-01-11: 08:00:00 via INTRAVENOUS

## 2016-01-11 MED ORDER — ONDANSETRON HCL 4 MG/2ML IJ SOLN
INTRAMUSCULAR | Status: AC
  Filled 2016-01-11: qty 2

## 2016-01-11 MED ORDER — PROPOFOL 10 MG/ML IV BOLUS
INTRAVENOUS | Status: DC | PRN
  Administered 2016-01-11: 130 mg via INTRAVENOUS

## 2016-01-11 MED ORDER — PROPOFOL 10 MG/ML IV BOLUS
INTRAVENOUS | Status: AC
  Filled 2016-01-11: qty 40

## 2016-01-11 MED ORDER — FENTANYL CITRATE (PF) 100 MCG/2ML IJ SOLN
INTRAMUSCULAR | Status: DC | PRN
  Administered 2016-01-11: 50 ug via INTRAVENOUS

## 2016-01-11 MED ORDER — SODIUM CHLORIDE 0.9 % IR SOLN
Status: DC | PRN
  Administered 2016-01-11: 500 mL

## 2016-01-11 MED ORDER — ROCURONIUM BROMIDE 50 MG/5ML IV SOLN
INTRAVENOUS | Status: AC
  Filled 2016-01-11: qty 1

## 2016-01-11 MED ORDER — CIPROFLOXACIN IN D5W 400 MG/200ML IV SOLN
400.0000 mg | INTRAVENOUS | Status: AC
  Administered 2016-01-11: 400 mg via INTRAVENOUS
  Filled 2016-01-11: qty 200

## 2016-01-11 MED ORDER — DEXAMETHASONE SODIUM PHOSPHATE 10 MG/ML IJ SOLN
INTRAMUSCULAR | Status: DC | PRN
  Administered 2016-01-11: 5 mg via INTRAVENOUS

## 2016-01-11 MED ORDER — FENTANYL CITRATE (PF) 250 MCG/5ML IJ SOLN
INTRAMUSCULAR | Status: AC
  Filled 2016-01-11: qty 5

## 2016-01-11 MED ORDER — LIDOCAINE HCL (CARDIAC) 20 MG/ML IV SOLN
INTRAVENOUS | Status: DC | PRN
  Administered 2016-01-11: 100 mg via INTRAVENOUS

## 2016-01-11 MED ORDER — ONDANSETRON HCL 4 MG/2ML IJ SOLN
INTRAMUSCULAR | Status: DC | PRN
  Administered 2016-01-11: 4 mg via INTRAVENOUS

## 2016-01-11 SURGICAL SUPPLY — 48 items
BAG DECANTER FOR FLEXI CONT (MISCELLANEOUS) ×3 IMPLANT
BLADE 10 SAFETY STRL DISP (BLADE) IMPLANT
BLADE SURG 10 STRL SS (BLADE) ×3 IMPLANT
BLADE SURG 15 STRL LF DISP TIS (BLADE) ×1 IMPLANT
BLADE SURG 15 STRL SS (BLADE) ×2
CANISTER SUCTION 2500CC (MISCELLANEOUS) ×3 IMPLANT
CONT SPEC 4OZ CLIKSEAL STRL BL (MISCELLANEOUS) ×3 IMPLANT
COVER SURGICAL LIGHT HANDLE (MISCELLANEOUS) ×3 IMPLANT
DRAPE LAPAROSCOPIC ABDOMINAL (DRAPES) ×3 IMPLANT
DRESSING HYDROCOLLOID 4X4 XTH (GAUZE/BANDAGES/DRESSINGS) ×3 IMPLANT
DRSG CUTIMED SORBACT 7X9 (GAUZE/BANDAGES/DRESSINGS) ×3 IMPLANT
DRSG EMULSION OIL 3X3 NADH (GAUZE/BANDAGES/DRESSINGS) IMPLANT
DRSG VAC ATS SM SENSATRAC (GAUZE/BANDAGES/DRESSINGS) ×3 IMPLANT
ELECT CAUTERY BLADE 6.4 (BLADE) ×3 IMPLANT
ELECT REM PT RETURN 9FT ADLT (ELECTROSURGICAL) ×3
ELECTRODE REM PT RTRN 9FT ADLT (ELECTROSURGICAL) ×1 IMPLANT
GAUZE SPONGE 4X4 12PLY STRL (GAUZE/BANDAGES/DRESSINGS) IMPLANT
GAUZE SPONGE 4X4 16PLY XRAY LF (GAUZE/BANDAGES/DRESSINGS) ×3 IMPLANT
GLOVE BIO SURGEON STRL SZ 6.5 (GLOVE) ×2 IMPLANT
GLOVE BIO SURGEONS STRL SZ 6.5 (GLOVE) ×1
GLOVE BIOGEL PI IND STRL 6.5 (GLOVE) ×1 IMPLANT
GLOVE BIOGEL PI IND STRL 7.0 (GLOVE) ×2 IMPLANT
GLOVE BIOGEL PI INDICATOR 6.5 (GLOVE) ×2
GLOVE BIOGEL PI INDICATOR 7.0 (GLOVE) ×4
GLOVE SURG SS PI 7.0 STRL IVOR (GLOVE) ×3 IMPLANT
GOWN STRL REUS W/ TWL LRG LVL3 (GOWN DISPOSABLE) ×3 IMPLANT
GOWN STRL REUS W/TWL LRG LVL3 (GOWN DISPOSABLE) ×6
KIT BASIN OR (CUSTOM PROCEDURE TRAY) ×3 IMPLANT
KIT ROOM TURNOVER OR (KITS) ×3 IMPLANT
MATRIX SURGICAL PSM 5X5CM (Tissue) ×6 IMPLANT
MICROMATRIX 1000MG (Tissue) ×3 IMPLANT
NS IRRIG 1000ML POUR BTL (IV SOLUTION) ×3 IMPLANT
PACK SURGICAL SETUP 50X90 (CUSTOM PROCEDURE TRAY) ×3 IMPLANT
PAD ARMBOARD 7.5X6 YLW CONV (MISCELLANEOUS) ×6 IMPLANT
PENCIL BUTTON HOLSTER BLD 10FT (ELECTRODE) ×3 IMPLANT
SOLUTION PARTIC MCRMTRX 1000MG (Tissue) ×1 IMPLANT
SPONGE LAP 18X18 X RAY DECT (DISPOSABLE) ×6 IMPLANT
SURGILUBE 2OZ TUBE FLIPTOP (MISCELLANEOUS) ×9 IMPLANT
SUT CHROMIC 4 0 P 3 18 (SUTURE) IMPLANT
SUT ETHILON 4 0 PS 2 18 (SUTURE) IMPLANT
SUT ETHILON 5 0 P 3 18 (SUTURE)
SUT NYLON ETHILON 5-0 P-3 1X18 (SUTURE) IMPLANT
SUT VIC AB 5-0 PS2 18 (SUTURE) ×12 IMPLANT
SYR BULB 3OZ (MISCELLANEOUS) IMPLANT
TOWEL OR 17X24 6PK STRL BLUE (TOWEL DISPOSABLE) ×3 IMPLANT
TOWEL OR 17X26 10 PK STRL BLUE (TOWEL DISPOSABLE) ×3 IMPLANT
TUBE CONNECTING 12'X1/4 (SUCTIONS) ×1
TUBE CONNECTING 12X1/4 (SUCTIONS) ×2 IMPLANT

## 2016-01-11 NOTE — H&P (View-Only) (Signed)
[name] is an 80 y.o. female.   Chief Complaint: chest / breast defect wound HPI:The patient is a 80 yrs old wf here for treatment of a chronic chest wound.  She underwent open heart surgery last year and has a wound that is resistant to heal. She had right breast radiation for breast cancer in the past.  She has been taken to the OR for debridement and ACell / VAC placement.  Past Medical History  Diagnosis Date  . Hypertension   . COPD (chronic obstructive pulmonary disease) ([facility])   . Hyperlipidemia   . Meniere disease   . PAF (paroxysmal atrial fibrillation) ([facility])   . COPD (chronic obstructive pulmonary disease) ([facility])   . Hypothyroidism   . Pulmonary hypertension ([facility])   . Chronic diastolic CHF (congestive heart failure) ([facility])   . PONV (postoperative nausea and vomiting)   . Anxiety   . GERD (gastroesophageal reflux disease)   . Severe Mitral Regurgitation s/p MV Repair     a. 04/2015 s/p 27 mm Edwards Magna Mitral bovine bioprosthetic tissue valve  . Tricuspid Regurgitation s/p Repair     a. 04/2015 s/p 26 mm Edwards mc3 ring annuloplasty  . 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 04/2015 Complete bilateral atrial lesion set using cryothermy and bipolar radiofrequency ablation with clipping of LA appendage  . CAD s/p CABG x 1     a. 04/2015 LIMA to LAD  . Post-surgical complete heart block, symptomatic     a. 04/2015 s/p MDT [id number] Advisa DR MRI DC PPM.  . Wound infection ([facility]) 10/13/2015    Superficial sternal wound infection  . Cancer of right breast ([facility]) 08/09/2001  . Presence of permanent cardiac pacemaker   . On home oxygen therapy     "2L at night" (11/10/2015)  . Pneumonia ~ 2010  . Arthritis     "hands" (11/10/2015)  . Surgical wound, non healing - chest wall 11/10/2015    Past Surgical History  Procedure Laterality Date  . Cochlear implant Left 2005?  . Cardiac catheterization  11/2013    [facility]  . Cardiac catheterization  10/2014    [facility]  . Mitral valve repair  N/A 04/25/2015    Procedure: MITRAL VALVE  REPLACEMENT using a 27 mm Edwards Perimount Magna Mitral Ease Valve;  Surgeon: Clarence H Owen, MD;  [facility];  Service: Open Heart Surgery;  Laterality: N/A;  . Tricuspid valve replacement N/A 04/25/2015    Procedure: TRICUSPID VALVE REPAIR;  Surgeon: Clarence H Owen, MD;  [facility];  Service: Open Heart Surgery;  Laterality: N/A;  . Maze N/A 04/25/2015    Procedure: MAZE;  Surgeon: Clarence H Owen, MD;  [facility];  Service: Open Heart Surgery;  Laterality: N/A;  . Tee without cardioversion N/A 04/25/2015    Procedure: TRANSESOPHAGEAL ECHOCARDIOGRAM (TEE);  Surgeon: Clarence H Owen, MD;  [facility];  Service: Open Heart Surgery;  Laterality: N/A;  . Clipping of atrial appendage N/A 04/25/2015    Procedure: CLIPPING OF ATRIAL APPENDAGE;  Surgeon: Clarence H Owen, MD;  [facility];  Service: Open Heart Surgery;  Laterality: N/A;  . Ep implantable device N/A 05/02/2015    Procedure: Pacemaker Implant;  Surgeon: James Allred, MD;  [facility];  Service: Cardiovascular;  Laterality: N/A;  . Sternal wound debridement N/A 05/09/2015    Procedure: STERNAL WOUND DEBRIDEMENT;  Surgeon: Clarence H Owen, MD;  [facility];    Service: Thoracic;  Laterality: N/A;  . Application of wound vac N/A 05/09/2015    Procedure: APPLICATION OF WOUND VAC;  Surgeon: Clarence H Owen, MD;  [facility];  Service: Thoracic;  Laterality: N/A;  . Sternal wires removal N/A 06/05/2015    Procedure: STERNAL WIRES REMOVAL;  Surgeon: Clarence H Owen, MD;  [facility];  Service: Thoracic;  Laterality: N/A;  . Tonsillectomy    . Breast biopsy Left 11/25/06    neg  . Breast biopsy Left 01/20/12    /clip-neg  . Mastectomy, partial Right 2002  . Vaginal hysterectomy    . Dilation and curettage of uterus  "several before hysterectomy"  . Cataract extraction w/ intraocular lens  implant, bilateral Bilateral 2014  . Cardiac valve replacement    .  Insert / replace / remove pacemaker    . Coronary artery bypass graft N/A 04/25/2015    Procedure: CORONARY ARTERY BYPASS GRAFTING (CABG) x ONE, using left internal mammary artery;  Surgeon: Clarence H Owen, MD;  [facility];  Service: Open Heart Surgery;  Laterality: N/A;  . Sternal wound debridement N/A 11/13/2015    Procedure: Excisional drainage of RIGHT Chest wall mass and breast mass ;  Surgeon: Clarence H Owen, MD;  [facility];  Service: Thoracic;  Laterality: N/A;  . Application of wound vac N/A 11/13/2015    Procedure: APPLICATION OF WOUND VAC;  Surgeon: Clarence H Owen, MD;  [facility];  Service: Thoracic;  Laterality: N/A;  . Incision and drainage of wound N/A 11/21/2015    Procedure: IRRIGATION AND DEBRIDEMENT WOUND;  Surgeon: Marijean Montanye S Lamarcus Spira, DO;  [facility];  Service: Plastics;  Laterality: N/A;  . Application of wound vac N/A 11/21/2015    Procedure: APPLICATION OF WOUND VAC;  Surgeon: Arav Bannister S Sophonie Goforth, DO;  [facility];  Service: Plastics;  Laterality: N/A;  . Application of a-cell of chest/abdomen N/A 11/21/2015    Procedure: APPLICATION OF A-CELL OF CHEST/ABDOMEN;  Surgeon: Gatlyn Lipari S Aberdeen Hafen, DO;  [facility];  Service: Plastics;  Laterality: N/A;  . Tram Right 11/18/2015    Procedure: VRAM Vertical Rectus Abdominus Muscle Flap;  Surgeon: Caelum Federici S Claramae Rigdon, DO;  [facility];  Service: Plastics;  Laterality: Right;  RIght Back  . I&d extremity Right 12/06/2015    Procedure: IRRIGATION AND DEBRIDEMENT RIGHT CHEST WALL WITH ACELL PLACEMENT AND VAC;  Surgeon: [place] Souter S Zaryiah Barz, DO;  [facility];  Service: Plastics;  Laterality: Right;    Family History  Problem Relation Age of Onset  . Heart disease Father   . Heart disease Brother   . Heart attack Paternal Uncle    Social History:  reports that she quit smoking about 17 years ago. Her smoking use included Cigarettes. She has a 60 pack-year smoking history. She has never used smokeless  tobacco. She reports that she drinks alcohol. She reports that she does not use illicit drugs.  Allergies:  Allergies  Allergen Reactions  . Ace Inhibitors Cough  . Augmentin [Amoxicillin-Pot Clavulanate] Swelling    JOINTS, PAIN  . Penicillins Swelling    Swollen joints, Has patient had a PCN reaction causing immediate rash, facial/tongue/throat swelling, SOB or lightheadedness with hypotension: Yes Has patient had a PCN reaction causing severe rash involving mucus membranes or skin necrosis: No Has patient had a PCN reaction that required hospitalization No Has patient had a PCN reaction occurring within the last 10 years: No If all of the above answers are "  NO", then may proceed with Cephalosporin use.   . Nifedipine Other (See Comments)    UNSPECIFIED  . Propranolol Other (See Comments)    UNSPECIFIED  . Diazepam Other (See Comments)    Made her "hyper"  . Meperidine Other (See Comments)    Made her "hyper"  . Propoxyphene Other (See Comments)    Made her "hyper"    No prescriptions prior to admission    Results for orders placed or performed during the hospital encounter of 12/21/15 (from the past 48 hour(s))  Basic metabolic panel     Status: Abnormal   Collection Time: 12/21/15  9:14 AM  Result Value Ref Range   Sodium 141 135 - 145 mmol/L   Potassium 4.1 3.5 - 5.1 mmol/L   Chloride 104 101 - 111 mmol/L   CO2 23 22 - 32 mmol/L   Glucose, Bld 127 (H) 65 - 99 mg/dL   BUN 16 6 - 20 mg/dL   Creatinine, Ser 0.81 0.44 - 1.00 mg/dL   Calcium 9.5 8.9 - 10.3 mg/dL   GFR calc non Af Amer >60 >60 mL/min   GFR calc Af Amer >60 >60 mL/min    Comment: (NOTE) The eGFR has been calculated using the CKD EPI equation. This calculation has not been validated in all clinical situations. eGFR's persistently <60 mL/min signify possible Chronic Kidney Disease.    Anion gap 14 5 - 15  CBC     Status: Abnormal   Collection Time: 12/21/15  9:14 AM  Result Value Ref Range   WBC 10.6  (H) 4.0 - 10.5 K/uL   RBC 3.72 (L) 3.87 - 5.11 MIL/uL   Hemoglobin 10.5 (L) 12.0 - 15.0 g/dL   HCT 33.5 (L) 36.0 - 46.0 %   MCV 90.1 78.0 - 100.0 fL   MCH 28.2 26.0 - 34.0 pg   MCHC 31.3 30.0 - 36.0 g/dL   RDW 18.1 (H) 11.5 - 15.5 %   Platelets 235 150 - 400 K/uL  PT- INR Day of Surgery     Status: Abnormal   Collection Time: 12/21/15  9:14 AM  Result Value Ref Range   Prothrombin Time 20.6 (H) 11.6 - 15.2 seconds   INR 1.77 (H) 0.00 - 1.49   No results found.  Review of Systems  Constitutional: Negative.   HENT: Negative.   Eyes: Negative.   Respiratory: Negative.   Cardiovascular: Negative.   Gastrointestinal: Negative.   Genitourinary: Negative.   Musculoskeletal: Negative.   Skin: Negative.   Neurological: Negative.   Psychiatric/Behavioral: Negative.     Blood pressure 115/60, pulse 69, temperature 97.5 F (36.4 C), temperature source Oral, resp. rate 16, weight 62.143 kg (137 lb), SpO2 93 %. Physical Exam  Constitutional: She is oriented to person, place, and time. She appears well-developed and well-nourished.  HENT:  Head: Normocephalic and atraumatic.  Eyes: Conjunctivae and EOM are normal. Pupils are equal, round, and reactive to light.  Respiratory: Effort normal.    GI: Soft. She exhibits no distension.  Neurological: She is alert and oriented to person, place, and time.  Skin: Skin is warm.  Psychiatric: She has a normal mood and affect. Her behavior is normal. Thought content normal.     Assessment/Plan Continue debridement with Acell and VAC placement.  Derius Ghosh S Niraj Kudrna, DO 12/21/2015, 4:22 PM    

## 2016-01-11 NOTE — Interval H&P Note (Signed)
History and Physical Interval Note:  01/11/2016 7:21 AM  [name]  has presented today for surgery, with the diagnosis of right chest wall wound  The various methods of treatment have been discussed with the patient and family. After consideration of risks, benefits and other options for treatment, the patient has consented to  Procedure(s): IRRIGATION AND DEBRIDEMENT OF Chest WOUND WITH Possible SPLIT THICKNESS SKIN GRAFT (Right) APPLICATION OF A-CELL OF CHEST/ABDOMEN (Right) as a surgical intervention .  The patient's history has been reviewed, patient examined, no change in status, stable for surgery.  I have reviewed the patient's chart and labs.  Questions were answered to the patient's satisfaction.     Wallace Going

## 2016-01-11 NOTE — Anesthesia Procedure Notes (Signed)
Procedure Name: LMA Insertion Date/Time: 01/11/2016 7:36 AM Performed by: [place] Pink Pre-anesthesia Checklist: Patient identified, Emergency Drugs available, Suction available, Patient being monitored and Timeout performed Patient Re-evaluated:Patient Re-evaluated prior to inductionOxygen Delivery Method: Circle system utilized Preoxygenation: Pre-oxygenation with 100% oxygen Intubation Type: IV induction LMA: LMA inserted LMA Size: 4.0 Number of attempts: 1 Placement Confirmation: positive ETCO2 and breath sounds checked- equal and bilateral Tube secured with: Tape Dental Injury: Teeth and Oropharynx as per pre-operative assessment

## 2016-01-11 NOTE — Discharge Instructions (Signed)
Continue VAC

## 2016-01-11 NOTE — Anesthesia Postprocedure Evaluation (Signed)
Anesthesia Post Note  Patient: [name]  Procedure(s) Performed: Procedure(s) (LRB): IRRIGATION AND DEBRIDEMENT OF RIGHT CHEST WOUND  (Right) APPLICATION OF A-CELL OF RIGHT CHEST (Right) APPLICATION OF WOUND VAC RIGHT CHEST  (Right)  Patient location during evaluation: PACU Anesthesia Type: General Level of consciousness: awake and alert Pain management: pain level controlled Vital Signs Assessment: post-procedure vital signs reviewed and stable Respiratory status: spontaneous breathing, nonlabored ventilation, respiratory function stable and patient connected to nasal cannula oxygen Cardiovascular status: blood pressure returned to baseline and stable Postop Assessment: no signs of nausea or vomiting Anesthetic complications: no    Last Vitals:  Filed Vitals:   01/11/16 0918 01/11/16 0922  BP:  140/59  Pulse:  70  Temp: 36.3 C   Resp:  18    Last Pain:  Filed Vitals:   01/11/16 0924  PainSc: 0-No pain                 Catalina Gravel

## 2016-01-11 NOTE — Progress Notes (Signed)
Nurse called and informed Tomi Bamberger ([employer] Rep) of cardiac device programming sheet orders, patients name, and time of surgery. Tomi Bamberger stated she would be here closer to 0730. Nurse thanked Tomi Bamberger. Call ended. Will inform Dr. Gifford Shave of this.

## 2016-01-11 NOTE — Op Note (Addendum)
Operative Note   DATE OF OPERATION: 01/11/2016  LOCATION:  [facility]  SURGICAL DIVISION: Plastic Surgery  PREOPERATIVE DIAGNOSES:  Chest Wall Defect  5 x 10 cm  POSTOPERATIVE DIAGNOSES:  same  PROCEDURE:  Preparation of chest wall defect for placement of Acell (TWO 5 x 5 cm sheet and 1 gm powder) and VAC.  SURGEON: Mardella Nuckles Sanger Nazar Kuan, DO  ASSISTANT: Shawn Rayburn, PA  ANESTHESIA:  General.   COMPLICATIONS: None.   INDICATIONS FOR PROCEDURE:  The patient, [name] is a 81 y.o. female born on Aug 06, 1935, is here for treatment of a chest wall defect.  She underwent a valve replacement and CABG.  She had wound breakdown at the sternal incision site that healed and then had inferior and right sided chest / cartilage breakdown.  This is likely due to the radiation she received for treatment of her right breast cancer. She underwent a latissimus muscle flap and had superficial loss.  She had debridement and Acell placed several weeks ago and returns for further care. MRN: [id number]  CONSENT:  Informed consent was obtained directly from the patient. Risks, benefits and alternatives were fully discussed. Specific risks including but not limited to bleeding, infection, hematoma, seroma, scarring, pain, infection, contracture, asymmetry, wound healing problems, and need for further surgery were all discussed. The patient did have an ample opportunity to have questions answered to satisfaction.   DESCRIPTION OF PROCEDURE:  The patient was taken to the operating room. SCDs were placed and IV antibiotics were given. The patient's operative site was prepped and draped in a sterile fashion. A time out was performed and all information was confirmed to be correct.  General anesthesia was administered.  The area was evaluated for blood supply and flow.  The fat layer 2 x 3 cm was debrided with a #10 blade and there was healthy bleeding again. The area was irrigated with  antibiotic solution.  The Acell powder (1 gm) and TWO sheets (5 x 5 cm) were applied and secured with 5-0 Vicryl.  The sorbac was applied and secured with a 5-0 Vicryl.  KY gel and the VAC was placed.  There was an excellent seal.  The patient tolerated the procedure well.  There were no complications. The patient was allowed to wake from anesthesia, extubated and taken to the recovery room in satisfactory condition.

## 2016-01-11 NOTE — Anesthesia Preprocedure Evaluation (Addendum)
Anesthesia Evaluation  Patient identified by MRN, date of birth, ID band Patient awake    Reviewed: Allergy & Precautions, NPO status , Patient's Chart, lab work & pertinent test results  History of Anesthesia Complications (+) PONV and history of anesthetic complications  Airway Mallampati: II  TM Distance: >3 FB Neck ROM: Full    Dental no notable dental hx. (+) Teeth Intact, Dental Advisory Given   Pulmonary COPD,  oxygen dependent, former smoker,    Pulmonary exam normal breath sounds clear to auscultation       Cardiovascular hypertension, Pt. on medications + CAD, + CABG and +CHF  + dysrhythmias Atrial Fibrillation + pacemaker  Rhythm:Regular Rate:Normal  Severe Mitral Regurgitation s/p MV Repair-8/16 Tricuspid Regurgitation s/p Repair-8/16 CAD s/p CABG x 1-8/16 MAZE-8/16   Neuro/Psych PSYCHIATRIC DISORDERS Anxiety negative neurological ROS     GI/Hepatic Neg liver ROS, GERD  Medicated,  Endo/Other  diabetesHypothyroidism   Renal/GU negative Renal ROS  negative genitourinary   Musculoskeletal  (+) Arthritis ,   Abdominal   Peds negative pediatric ROS (+)  Hematology negative hematology ROS (+)   Anesthesia Other Findings Day of surgery medications reviewed with the patient.  Reproductive/Obstetrics negative OB ROS                           Anesthesia Physical Anesthesia Plan  ASA: III  Anesthesia Plan: General   Post-op Pain Management:    Induction: Intravenous  Airway Management Planned: LMA  Additional Equipment:   Intra-op Plan:   Post-operative Plan: Extubation in OR  Informed Consent: I have reviewed the patients History and Physical, chart, labs and discussed the procedure including the risks, benefits and alternatives for the proposed anesthesia with the patient or authorized representative who has indicated his/her understanding and acceptance.   Dental  advisory given  Plan Discussed with: CRNA and Anesthesiologist  Anesthesia Plan Comments:         Anesthesia Quick Evaluation

## 2016-01-11 NOTE — Transfer of Care (Signed)
Immediate Anesthesia Transfer of Care Note  Patient: [name]  Procedure(s) Performed: Procedure(s): IRRIGATION AND DEBRIDEMENT OF RIGHT CHEST WOUND  (Right) APPLICATION OF A-CELL OF RIGHT CHEST (Right) APPLICATION OF WOUND VAC RIGHT CHEST  (Right)  Patient Location: PACU  Anesthesia Type:General  Level of Consciousness: awake, alert  and oriented  Airway & Oxygen Therapy: Patient Spontanous Breathing and Patient connected to nasal cannula oxygen  Post-op Assessment: Report given to RN and Post -op Vital signs reviewed and stable  Post vital signs: Reviewed and stable  Last Vitals:  Filed Vitals:   01/11/16 0632  BP: 130/62  Pulse: 80  Temp: 36.7 C  Resp: 16    Last Pain: There were no vitals filed for this visit.       Complications: No apparent anesthesia complications

## 2016-01-11 NOTE — Progress Notes (Signed)
[name] at bedside to interrogate pacer. States all looks perfect and pt is good to go home.

## 2016-01-11 NOTE — Brief Op Note (Addendum)
01/11/2016  7:34 AM  PATIENT:  [name]  81 y.o. female  PRE-OPERATIVE DIAGNOSIS:  right chest wall wound  POST-OPERATIVE DIAGNOSIS:  same  PROCEDURE:  Procedure(s): IRRIGATION AND DEBRIDEMENT OF Chest WOUND WITH Possible SPLIT THICKNESS SKIN GRAFT (Right) APPLICATION OF A-CELL OF CHEST/ABDOMEN (Right)  SURGEON:  Surgeon(s) and Role:    * Loel Lofty Dillingham, DO - Primary  PHYSICIAN ASSISTANT: Shawn Rayburn, PA  ASSISTANTS: none   ANESTHESIA:   general  EBL:     BLOOD ADMINISTERED:none  DRAINS: none   LOCAL MEDICATIONS USED:  NONE  SPECIMEN:  No Specimen  DISPOSITION OF SPECIMEN:  N/A  COUNTS:  YES  TOURNIQUET:  * No tourniquets in log *  DICTATION: .Dragon Dictation  PLAN OF CARE: Discharge to home after PACU  PATIENT DISPOSITION:  PACU - hemodynamically stable.   Delay start of Pharmacological VTE agent (>24hrs) due to surgical blood loss or risk of bleeding: no

## 2016-01-12 ENCOUNTER — Encounter (HOSPITAL_COMMUNITY): Payer: Self-pay | Admitting: Plastic Surgery

## 2016-01-18 ENCOUNTER — Other Ambulatory Visit: Payer: Self-pay | Admitting: Plastic Surgery

## 2016-01-18 DIAGNOSIS — S21101A Unspecified open wound of right front wall of thorax without penetration into thoracic cavity, initial encounter: Secondary | ICD-10-CM

## 2016-01-19 ENCOUNTER — Ambulatory Visit (INDEPENDENT_AMBULATORY_CARE_PROVIDER_SITE_OTHER): Payer: Medicare Other

## 2016-01-19 DIAGNOSIS — Z953 Presence of xenogenic heart valve: Secondary | ICD-10-CM

## 2016-01-19 DIAGNOSIS — I340 Nonrheumatic mitral (valve) insufficiency: Secondary | ICD-10-CM

## 2016-01-19 DIAGNOSIS — I4891 Unspecified atrial fibrillation: Secondary | ICD-10-CM

## 2016-01-19 LAB — POCT INR: INR: 2.2

## 2016-01-23 ENCOUNTER — Telehealth: Payer: Self-pay | Admitting: Pharmacist

## 2016-01-23 ENCOUNTER — Encounter (HOSPITAL_COMMUNITY): Payer: Self-pay | Admitting: *Deleted

## 2016-01-23 NOTE — Telephone Encounter (Signed)
Pt called to let us know that she is having upcoming I&D with possible skin graft tomorrow and was instructed by her doctor to hold Coumadin for 2 days. Pt on Coumadin for afib, CHADS2 score of 4 (HTN, age, HF, DM), bioprosthetic valve. Ok to hold Coumadin x2 days for procedure. Of note, pt has previously held for 3 days prior to procedures. Advised pt that this is fine to hold for 2 days and she will f/u with Coumadin clinic afterwards as scheduled.

## 2016-01-23 NOTE — Progress Notes (Signed)
Pt was here on 01/11/16, she states nothing has changed with her allergies, medications, medical and surgical history. She states she has not been instructed about stopping her Coumadin, but she is not planning to take it tonight or tomorrow as she did the last time. I told her that I would verify with Dr. Eusebio Friendly office that this is how they want her to stop her Coumadin. I told her that I would call her back only if something is different. She voiced understanding.  I have left a message on Kim's voicemail (Dr. Eusebio Friendly scheduler) to clarify Coumadin instructions.

## 2016-01-24 ENCOUNTER — Ambulatory Visit (HOSPITAL_COMMUNITY): Payer: Medicare Other | Admitting: Anesthesiology

## 2016-01-24 ENCOUNTER — Encounter (HOSPITAL_COMMUNITY)
Admission: RE | Disposition: A | Discharge status: Home / Self Care | Payer: Self-pay | Source: Ambulatory Visit | Attending: Plastic Surgery

## 2016-01-24 ENCOUNTER — Encounter (HOSPITAL_COMMUNITY): Payer: Self-pay | Admitting: Plastic Surgery

## 2016-01-24 ENCOUNTER — Ambulatory Visit (HOSPITAL_COMMUNITY)
Admission: RE | Admit: 2016-01-24 | Discharge: 2016-01-24 | Disposition: A | Discharge status: Home / Self Care | Payer: Medicare Other | Source: Ambulatory Visit | Attending: Plastic Surgery | Admitting: Plastic Surgery

## 2016-01-24 DIAGNOSIS — I4891 Unspecified atrial fibrillation: Secondary | ICD-10-CM | POA: Diagnosis not present

## 2016-01-24 DIAGNOSIS — Y832 Surgical operation with anastomosis, bypass or graft as the cause of abnormal reaction of the patient, or of later complication, without mention of misadventure at the time of the procedure: Secondary | ICD-10-CM | POA: Insufficient documentation

## 2016-01-24 DIAGNOSIS — E039 Hypothyroidism, unspecified: Secondary | ICD-10-CM | POA: Insufficient documentation

## 2016-01-24 DIAGNOSIS — I509 Heart failure, unspecified: Secondary | ICD-10-CM | POA: Insufficient documentation

## 2016-01-24 DIAGNOSIS — Z9981 Dependence on supplemental oxygen: Secondary | ICD-10-CM | POA: Diagnosis not present

## 2016-01-24 DIAGNOSIS — F419 Anxiety disorder, unspecified: Secondary | ICD-10-CM | POA: Diagnosis not present

## 2016-01-24 DIAGNOSIS — Z87891 Personal history of nicotine dependence: Secondary | ICD-10-CM | POA: Diagnosis not present

## 2016-01-24 DIAGNOSIS — I2510 Atherosclerotic heart disease of native coronary artery without angina pectoris: Secondary | ICD-10-CM | POA: Insufficient documentation

## 2016-01-24 DIAGNOSIS — I10 Essential (primary) hypertension: Secondary | ICD-10-CM | POA: Insufficient documentation

## 2016-01-24 DIAGNOSIS — T8131XA Disruption of external operation (surgical) wound, not elsewhere classified, initial encounter: Secondary | ICD-10-CM | POA: Diagnosis present

## 2016-01-24 DIAGNOSIS — Z951 Presence of aortocoronary bypass graft: Secondary | ICD-10-CM | POA: Diagnosis not present

## 2016-01-24 DIAGNOSIS — S21101A Unspecified open wound of right front wall of thorax without penetration into thoracic cavity, initial encounter: Secondary | ICD-10-CM

## 2016-01-24 DIAGNOSIS — M1990 Unspecified osteoarthritis, unspecified site: Secondary | ICD-10-CM | POA: Diagnosis not present

## 2016-01-24 DIAGNOSIS — E119 Type 2 diabetes mellitus without complications: Secondary | ICD-10-CM | POA: Diagnosis not present

## 2016-01-24 DIAGNOSIS — Z481 Encounter for planned postprocedural wound closure: Secondary | ICD-10-CM | POA: Insufficient documentation

## 2016-01-24 DIAGNOSIS — K219 Gastro-esophageal reflux disease without esophagitis: Secondary | ICD-10-CM | POA: Insufficient documentation

## 2016-01-24 DIAGNOSIS — J449 Chronic obstructive pulmonary disease, unspecified: Secondary | ICD-10-CM | POA: Diagnosis not present

## 2016-01-24 DIAGNOSIS — Z950 Presence of cardiac pacemaker: Secondary | ICD-10-CM | POA: Insufficient documentation

## 2016-01-24 HISTORY — PX: INCISION AND DRAINAGE OF WOUND: SHX1803

## 2016-01-24 HISTORY — PX: APPLICATION OF WOUND VAC: SHX5189

## 2016-01-24 LAB — PROTIME-INR
INR: 1.79 — ABNORMAL HIGH (ref 0.00–1.49)
PROTHROMBIN TIME: 20.7 s — AB (ref 11.6–15.2)

## 2016-01-24 LAB — HEMOGLOBIN: Hemoglobin: 10.2 g/dL — ABNORMAL LOW (ref 12.0–15.0)

## 2016-01-24 SURGERY — IRRIGATION AND DEBRIDEMENT WOUND
Anesthesia: General | Site: Chest | Laterality: Right

## 2016-01-24 MED ORDER — SODIUM CHLORIDE 0.9 % IR SOLN
Status: DC | PRN
  Administered 2016-01-24: 500 mL

## 2016-01-24 MED ORDER — SODIUM CHLORIDE 0.9 % IR SOLN
Status: DC | PRN
  Administered 2016-01-24: 1000 mL

## 2016-01-24 MED ORDER — LIDOCAINE-EPINEPHRINE 1 %-1:100000 IJ SOLN
INTRAMUSCULAR | Status: AC
  Filled 2016-01-24: qty 1

## 2016-01-24 MED ORDER — ONDANSETRON HCL 4 MG/2ML IJ SOLN
INTRAMUSCULAR | Status: DC | PRN
  Administered 2016-01-24: 4 mg via INTRAVENOUS

## 2016-01-24 MED ORDER — PROPOFOL 10 MG/ML IV BOLUS
INTRAVENOUS | Status: AC
  Filled 2016-01-24: qty 20

## 2016-01-24 MED ORDER — FENTANYL CITRATE (PF) 250 MCG/5ML IJ SOLN
INTRAMUSCULAR | Status: AC
  Filled 2016-01-24: qty 5

## 2016-01-24 MED ORDER — ONDANSETRON HCL 4 MG/2ML IJ SOLN
INTRAMUSCULAR | Status: AC
  Filled 2016-01-24: qty 2

## 2016-01-24 MED ORDER — LACTATED RINGERS IV SOLN
INTRAVENOUS | Status: DC
  Administered 2016-01-24 (×2): via INTRAVENOUS

## 2016-01-24 MED ORDER — CIPROFLOXACIN IN D5W 400 MG/200ML IV SOLN
INTRAVENOUS | Status: AC
  Filled 2016-01-24: qty 200

## 2016-01-24 MED ORDER — TRAMADOL HCL 50 MG PO TABS: 50.0000 mg | ORAL_TABLET | Freq: Four times a day (QID) | ORAL | Status: DC | PRN

## 2016-01-24 MED ORDER — PROPOFOL 10 MG/ML IV BOLUS
INTRAVENOUS | Status: DC | PRN
  Administered 2016-01-24: 100 mg via INTRAVENOUS

## 2016-01-24 MED ORDER — LIDOCAINE-EPINEPHRINE 1 %-1:100000 IJ SOLN
INTRAMUSCULAR | Status: DC | PRN
  Administered 2016-01-24: 20 mL

## 2016-01-24 MED ORDER — LACTATED RINGERS IV SOLN: INTRAVENOUS | Status: DC

## 2016-01-24 MED ORDER — FENTANYL CITRATE (PF) 100 MCG/2ML IJ SOLN: 25.0000 ug | INTRAMUSCULAR | Status: DC | PRN

## 2016-01-24 MED ORDER — FENTANYL CITRATE (PF) 100 MCG/2ML IJ SOLN
INTRAMUSCULAR | Status: DC | PRN
  Administered 2016-01-24: 50 ug via INTRAVENOUS

## 2016-01-24 MED ORDER — LIDOCAINE HCL (CARDIAC) 20 MG/ML IV SOLN
INTRAVENOUS | Status: DC | PRN
  Administered 2016-01-24: 60 mg via INTRAVENOUS

## 2016-01-24 MED ORDER — CIPROFLOXACIN IN D5W 400 MG/200ML IV SOLN
400.0000 mg | INTRAVENOUS | Status: AC
  Administered 2016-01-24: 400 mg via INTRAVENOUS

## 2016-01-24 SURGICAL SUPPLY — 51 items
BAG DECANTER FOR FLEXI CONT (MISCELLANEOUS) ×2 IMPLANT
BENZOIN TINCTURE PRP APPL 2/3 (GAUZE/BANDAGES/DRESSINGS) IMPLANT
CANISTER SUCTION 2500CC (MISCELLANEOUS) ×2 IMPLANT
CONT SPEC STER OR (MISCELLANEOUS) IMPLANT
COVER SURGICAL LIGHT HANDLE (MISCELLANEOUS) ×2 IMPLANT
DRAPE IMP U-DRAPE 54X76 (DRAPES) IMPLANT
DRAPE INCISE IOBAN 66X45 STRL (DRAPES) ×2 IMPLANT
DRAPE LAPAROSCOPIC ABDOMINAL (DRAPES) ×2 IMPLANT
DRAPE PED LAPAROTOMY (DRAPES) IMPLANT
DRAPE PROXIMA HALF (DRAPES) IMPLANT
DRESSING HYDROCOLLOID 4X4 (GAUZE/BANDAGES/DRESSINGS) ×2 IMPLANT
DRSG ADAPTIC 3X8 NADH LF (GAUZE/BANDAGES/DRESSINGS) IMPLANT
DRSG CUTIMED SORBACT 7X9 (GAUZE/BANDAGES/DRESSINGS) ×2 IMPLANT
DRSG PAD ABDOMINAL 8X10 ST (GAUZE/BANDAGES/DRESSINGS) IMPLANT
DRSG VAC ATS LRG SENSATRAC (GAUZE/BANDAGES/DRESSINGS) IMPLANT
DRSG VAC ATS MED SENSATRAC (GAUZE/BANDAGES/DRESSINGS) IMPLANT
DRSG VAC ATS SM SENSATRAC (GAUZE/BANDAGES/DRESSINGS) ×2 IMPLANT
ELECT CAUTERY BLADE 6.4 (BLADE) ×2 IMPLANT
ELECT REM PT RETURN 9FT ADLT (ELECTROSURGICAL) ×2
ELECTRODE REM PT RTRN 9FT ADLT (ELECTROSURGICAL) ×1 IMPLANT
GAUZE SPONGE 4X4 12PLY STRL (GAUZE/BANDAGES/DRESSINGS) IMPLANT
GLOVE BIO SURGEON STRL SZ 6.5 (GLOVE) ×4 IMPLANT
GLOVE BIO SURGEON STRL SZ8 (GLOVE) ×2 IMPLANT
GLOVE BIOGEL PI IND STRL 8.5 (GLOVE) ×1 IMPLANT
GLOVE BIOGEL PI INDICATOR 8.5 (GLOVE) ×1
GOWN STRL REUS W/ TWL LRG LVL3 (GOWN DISPOSABLE) ×2 IMPLANT
GOWN STRL REUS W/ TWL XL LVL3 (GOWN DISPOSABLE) ×1 IMPLANT
GOWN STRL REUS W/TWL LRG LVL3 (GOWN DISPOSABLE) ×2
GOWN STRL REUS W/TWL XL LVL3 (GOWN DISPOSABLE) ×1
KIT BASIN OR (CUSTOM PROCEDURE TRAY) ×2 IMPLANT
KIT ROOM TURNOVER OR (KITS) ×2 IMPLANT
MATRIX SURGICAL PSM 5X5CM (Tissue) ×2 IMPLANT
MICROMATRIX 1000MG (Tissue) ×2 IMPLANT
NEEDLE HYPO 25GX1X1/2 BEV (NEEDLE) ×2 IMPLANT
NS IRRIG 1000ML POUR BTL (IV SOLUTION) ×2 IMPLANT
PACK GENERAL/GYN (CUSTOM PROCEDURE TRAY) ×2 IMPLANT
PACK UNIVERSAL I (CUSTOM PROCEDURE TRAY) IMPLANT
PAD ARMBOARD 7.5X6 YLW CONV (MISCELLANEOUS) ×2 IMPLANT
SOLUTION PARTIC MCRMTRX 1000MG (Tissue) ×1 IMPLANT
STAPLER VISISTAT 35W (STAPLE) IMPLANT
SURGILUBE 2OZ TUBE FLIPTOP (MISCELLANEOUS) ×2 IMPLANT
SUT MNCRL AB 3-0 PS2 18 (SUTURE) ×4 IMPLANT
SUT MNCRL AB 4-0 PS2 18 (SUTURE) ×2 IMPLANT
SUT SILK 4 0 P 3 (SUTURE) ×2 IMPLANT
SUT VIC AB 5-0 PS2 18 (SUTURE) ×6 IMPLANT
SWAB COLLECTION DEVICE MRSA (MISCELLANEOUS) IMPLANT
SYR BULB IRRIGATION 50ML (SYRINGE) ×2 IMPLANT
SYR CONTROL 10ML LL (SYRINGE) ×2 IMPLANT
TOWEL OR 17X26 10 PK STRL BLUE (TOWEL DISPOSABLE) ×2 IMPLANT
TUBE ANAEROBIC SPECIMEN COL (MISCELLANEOUS) IMPLANT
UNDERPAD 30X30 INCONTINENT (UNDERPADS AND DIAPERS) IMPLANT

## 2016-01-24 NOTE — Op Note (Signed)
Operative Note   DATE OF OPERATION: 01/24/2016  LOCATION:  [facility]  SURGICAL DIVISION: Plastic Surgery  PREOPERATIVE DIAGNOSES:  Chest Wall Defect  5 x 10 cm  POSTOPERATIVE DIAGNOSES:  same  PROCEDURE:   1. Primary closure of 4 cm lateral chest wound 2. Preparation of chest wall defect for placement of Acell (5 x 5 cm sheet and 1 gm powder) and VAC.  SURGEON: Tammye Kahler Sanger Anasia Agro, DO  ASSISTANT: Shawn Rayburn, PA  ANESTHESIA:  General.   COMPLICATIONS: None.   INDICATIONS FOR PROCEDURE:  The patient, [name] is a 81 y.o. female born on 1935-09-03, is here for treatment of a chest wall defect.  She underwent a valve replacement and CABG.  She had wound breakdown at the sternal incision site that healed and then had inferior and right sided chest / cartilage breakdown.  This is likely due to the radiation she received for treatment of her right breast cancer. She underwent a latissimus muscle flap and had superficial loss.  She had debridement and Acell placed several weeks ago and returns for further care. MRN: [id number]  CONSENT:  Informed consent was obtained directly from the patient. Risks, benefits and alternatives were fully discussed. Specific risks including but not limited to bleeding, infection, hematoma, seroma, scarring, pain, infection, contracture, asymmetry, wound healing problems, and need for further surgery were all discussed. The patient did have an ample opportunity to have questions answered to satisfaction.   DESCRIPTION OF PROCEDURE:  The patient was taken to the operating room. SCDs were placed and IV antibiotics were given. The patient's operative site was prepped and draped in a sterile fashion. A time out was performed and all information was confirmed to be correct.  General anesthesia was administered.  The fat layer 2 x 2 cm was debrided with a #10 blade medially to healthy bleeding. The area was irrigated with antibiotic  solution.  Local was placed in the superior and inferior flaps of the lateral aspect for intraoperative hemostasis and postoperative pain control.  1 cm of undermining was done superiorly and inferiorly.  The skin was then closed for 4 cm laterally with 4-0 Monocryl vertical mattress sutures. The Acell powder (1 gm) and sheet (5 x 5 cm) were applied and secured with 5-0 Vicryl.  The wound was reduced to 5 x 6 cm.  There is improved granulation but not ready for a skin graft. The sorbac was applied and secured with a 5-0 Vicryl.  KY gel and the VAC was placed.  There was an excellent seal.  The patient tolerated the procedure well.  There were no complications. The patient was allowed to wake from anesthesia, extubated and taken to the recovery room in satisfactory condition.

## 2016-01-24 NOTE — Anesthesia Procedure Notes (Signed)
Procedure Name: LMA Insertion Date/Time: 01/24/2016 2:08 PM Performed by: Trixie Deis A Pre-anesthesia Checklist: Patient identified, Timeout performed, Emergency Drugs available, Suction available and Patient being monitored Patient Re-evaluated:Patient Re-evaluated prior to inductionOxygen Delivery Method: Circle system utilized Preoxygenation: Pre-oxygenation with 100% oxygen Ventilation: Mask ventilation without difficulty LMA: LMA inserted LMA Size: 4.0 Number of attempts: 1 Placement Confirmation: positive ETCO2 and breath sounds checked- equal and bilateral Tube secured with: Tape Dental Injury: Teeth and Oropharynx as per pre-operative assessment

## 2016-01-24 NOTE — Discharge Instructions (Signed)
Please change VAC weekly VAC to be at 100 or 125 mmHg pressure continuous pressure.

## 2016-01-24 NOTE — Interval H&P Note (Signed)
History and Physical Interval Note:  01/24/2016 12:12 PM  [name]  has presented today for surgery, with the diagnosis of right chest wall wound  The various methods of treatment have been discussed with the patient and family. After consideration of risks, benefits and other options for treatment, the patient has consented to  Procedure(s): IRRIGATION AND DEBRIDEMENT RIGHT CHEST WALL WOUND POSSIBLE SKIN GRAFT (Right) as a surgical intervention .  The patient's history has been reviewed, patient examined, no change in status, stable for surgery.  I have reviewed the patient's chart and labs.  Questions were answered to the patient's satisfaction.     Wallace Going

## 2016-01-24 NOTE — Progress Notes (Signed)
Patients hearing device reapplied to pt Left ear on arrival to PACU by CRNA

## 2016-01-24 NOTE — Anesthesia Preprocedure Evaluation (Addendum)
Anesthesia Evaluation  Patient identified by MRN, date of birth, ID band Patient awake    Reviewed: Allergy & Precautions, NPO status , Patient's Chart, lab work & pertinent test results, reviewed documented beta blocker date and time   History of Anesthesia Complications (+) PONV and history of anesthetic complications  Airway Mallampati: II  TM Distance: >3 FB Neck ROM: Full    Dental no notable dental hx. (+) Teeth Intact, Dental Advisory Given   Pulmonary COPD,  oxygen dependent, former smoker,    Pulmonary exam normal breath sounds clear to auscultation       Cardiovascular hypertension, Pt. on medications and Pt. on home beta blockers + CAD, + CABG and +CHF  + dysrhythmias Atrial Fibrillation + pacemaker  Rhythm:Regular Rate:Normal  Severe Mitral Regurgitation s/p MV Repair-8/16 Tricuspid Regurgitation s/p Repair-8/16 CAD s/p CABG x 1-8/16 MAZE-8/16   Neuro/Psych PSYCHIATRIC DISORDERS Anxiety negative neurological ROS     GI/Hepatic Neg liver ROS, GERD  Medicated and Controlled,  Endo/Other  diabetesHypothyroidism   Renal/GU negative Renal ROS  negative genitourinary   Musculoskeletal  (+) Arthritis ,   Abdominal   Peds negative pediatric ROS (+)  Hematology negative hematology ROS (+)   Anesthesia Other Findings   Reproductive/Obstetrics negative OB ROS                           Lab Results  Component Value Date   WBC 9.0 01/11/2016   HGB 9.3* 01/11/2016   HCT 30.9* 01/11/2016   MCV 91.4 01/11/2016   PLT 246 01/11/2016   Lab Results  Component Value Date   CREATININE 0.78 01/11/2016   BUN 17 01/11/2016   NA 139 01/11/2016   K 4.1 01/11/2016   CL 103 01/11/2016   CO2 24 01/11/2016   Lab Results  Component Value Date   INR 2.2 01/19/2016   INR 2.13* 01/11/2016   INR 2.5 01/10/2016   12/2015 EKG: RBBB, frequent PVC's noted, paced.  08/2015 Echo - Left ventricle:  The cavity size was moderately dilated. There was mild concentric hypertrophy. Systolic function was moderately to severely reduced. The estimated ejection fraction was in the range of 30% to 35%. Diffuse hypokinesis. Features are consistent with a pseudonormal left ventricular filling pattern, with concomitant abnormal relaxation and increased filling pressure (grade 2 diastolic dysfunction). - Mitral valve: Prior procedures included surgical repair and appears to be functioning normally. Mean gradient (D): 3 mm Hg. - Left atrium: The atrium was mildly dilated. - Pulmonary arteries: Systolic pressure was within the normal range.    Anesthesia Physical Anesthesia Plan  ASA: III  Anesthesia Plan: General   Post-op Pain Management:    Induction: Intravenous  Airway Management Planned: LMA  Additional Equipment:   Intra-op Plan:   Post-operative Plan: Extubation in OR  Informed Consent: I have reviewed the patients History and Physical, chart, labs and discussed the procedure including the risks, benefits and alternatives for the proposed anesthesia with the patient or authorized representative who has indicated his/her understanding and acceptance.   Dental advisory given  Plan Discussed with: CRNA  Anesthesia Plan Comments:         Anesthesia Quick Evaluation

## 2016-01-24 NOTE — Transfer of Care (Signed)
Immediate Anesthesia Transfer of Care Note  Patient: [name]  Procedure(s) Performed: Procedure(s): IRRIGATION AND DEBRIDEMENT RIGHT CHEST WALL WOUND (Right) APPLICATION OF WOUND VAC RIGHT CHEST WALL (Right)  Patient Location: PACU  Anesthesia Type:General  Level of Consciousness: awake, alert  and oriented  Airway & Oxygen Therapy: Patient Spontanous Breathing and Patient connected to nasal cannula oxygen  Post-op Assessment: Report given to RN, Post -op Vital signs reviewed and stable and Patient moving all extremities  Post vital signs: Reviewed and stable  Last Vitals:  Filed Vitals:   01/24/16 1226  BP: 113/41  Pulse: 63  Temp: 36.6 C  Resp: 18    Last Pain: There were no vitals filed for this visit.    Patients Stated Pain Goal: 3 ([id number])  Complications: No apparent anesthesia complications

## 2016-01-24 NOTE — H&P (Signed)
[name] is an 81 y.o. female.   Chief Complaint:r ight chest wall wound HPI: [name] is a 81 yo female seen for post operative follow up following repair to her right chest wall defect with a latissimus muscle flap on 11/18/15.  History:  She developed an open wound of her right chest wall following CABG x 1 and MVR in August of 2016. The sternal incision had ongoing difficulty healing and eventually developed an open wound with drainage. She had remotely undergone treatment to the right chest area for right breast cancer and it was felt that this may be way she was having difficulty healing post operatively.  She underwent excisional debridement by CT surgery and we consulted in the hospital for coverage of the residual wound/defect. The latissimus flap was done but there was some superficial loss of the skin over the muscle post operatively and the patient was taken back to the OR x 3 for debridement of the skin/superficial tissue loss over the muscle flap with placement of Acell and VAC dressing. She is seen for post operative follow up today. She continues on Cipro po at home and is anticoagulated on Coumadin.   Past Medical History  Diagnosis Date  . Hypertension   . COPD (chronic obstructive pulmonary disease) ([place])   . Hyperlipidemia   . Meniere disease   . PAF (paroxysmal atrial fibrillation) ([place])   . COPD (chronic obstructive pulmonary disease) ([place])   . Hypothyroidism   . Pulmonary hypertension ([place])   . Chronic diastolic CHF (congestive heart failure) ([place])   . PONV (postoperative nausea and vomiting)   . Anxiety   . GERD (gastroesophageal reflux disease)   . Severe Mitral Regurgitation s/p MV Repair     a. 04/2015 s/p 27 mm [facility] Mitral bovine bioprosthetic tissue valve  . Tricuspid Regurgitation s/p Repair     a. 04/2015 s/p 26 mm Edwards mc3 ring annuloplasty  . 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 04/2015 Complete bilateral atrial lesion set using  cryothermy and bipolar radiofrequency ablation with clipping of LA appendage  . CAD s/p CABG x 1     a. 04/2015 LIMA to LAD  . Post-surgical complete heart block, symptomatic     a. 04/2015 s/p MDT [id number] Advisa DR MRI DC PPM.  . Wound infection ([place]) 10/13/2015    Superficial sternal wound infection  . Cancer of right breast ([place]) 08/09/2001  . Presence of permanent cardiac pacemaker   . On home oxygen therapy     "2L at night" (11/10/2015)  . Pneumonia ~ 2010  . Arthritis     "hands" (11/10/2015)  . Surgical wound, non healing - chest wall 11/10/2015    Past Surgical History  Procedure Laterality Date  . Cochlear implant Left 2005?  Marland Kitchen Cardiac catheterization  11/2013    [facility]  . Cardiac catheterization  10/2014    [facility]  . Mitral valve repair N/A 04/25/2015    Procedure: MITRAL VALVE  REPLACEMENT using a 27 mm Edwards Perimount Magna Mitral Ease Valve;  Surgeon: Rexene Alberts, MD;  Location: [place];  Service: Open Heart Surgery;  Laterality: N/A;  . Tricuspid valve replacement N/A 04/25/2015    Procedure: TRICUSPID VALVE REPAIR;  Surgeon: Rexene Alberts, MD;  Location: [place];  Service: Open Heart Surgery;  Laterality: N/A;  . Maze N/A 04/25/2015    Procedure: MAZE;  Surgeon: Rexene Alberts, MD;  Location: [place];  Service:  Open Heart Surgery;  Laterality: N/A;  . Tee without cardioversion N/A 04/25/2015    Procedure: TRANSESOPHAGEAL ECHOCARDIOGRAM (TEE);  Surgeon: Rexene Alberts, MD;  Location: [place];  Service: Open Heart Surgery;  Laterality: N/A;  . Clipping of atrial appendage N/A 04/25/2015    Procedure: CLIPPING OF ATRIAL APPENDAGE;  Surgeon: Rexene Alberts, MD;  Location: [place];  Service: Open Heart Surgery;  Laterality: N/A;  . Ep implantable device N/A 05/02/2015    Procedure: Pacemaker Implant;  Surgeon: Thompson Grayer, MD;  Location: [place] [facility];  Service: Cardiovascular;  Laterality: N/A;  . Sternal wound debridement N/A 05/09/2015    Procedure: STERNAL WOUND DEBRIDEMENT;   Surgeon: Rexene Alberts, MD;  Location: [place];  Service: Thoracic;  Laterality: N/A;  . Application of wound vac N/A 05/09/2015    Procedure: APPLICATION OF WOUND VAC;  Surgeon: Rexene Alberts, MD;  Location: [place];  Service: Thoracic;  Laterality: N/A;  . Sternal wires removal N/A 06/05/2015    Procedure: STERNAL WIRES REMOVAL;  Surgeon: Rexene Alberts, MD;  Location: [place];  Service: Thoracic;  Laterality: N/A;  . Tonsillectomy    . Breast biopsy Left 11/25/06    neg  . Breast biopsy Left 01/20/12    /clip-neg  . Mastectomy, partial Right 2002  . Vaginal hysterectomy    . Dilation and curettage of uterus  "several before hysterectomy"  . Cataract extraction w/ intraocular lens  implant, bilateral Bilateral 2014  . Cardiac valve replacement    . Insert / replace / remove pacemaker    . Coronary artery bypass graft N/A 04/25/2015    Procedure: CORONARY ARTERY BYPASS GRAFTING (CABG) x ONE, using left internal mammary artery;  Surgeon: Rexene Alberts, MD;  Location: [place];  Service: Open Heart Surgery;  Laterality: N/A;  . Sternal wound debridement N/A 11/13/2015    Procedure: Excisional drainage of RIGHT Chest wall mass and breast mass ;  Surgeon: Rexene Alberts, MD;  Location: [place];  Service: Thoracic;  Laterality: N/A;  . Application of wound vac N/A 11/13/2015    Procedure: APPLICATION OF WOUND VAC;  Surgeon: Rexene Alberts, MD;  Location: [place];  Service: Thoracic;  Laterality: N/A;  . Incision and drainage of wound N/A 11/21/2015    Procedure: IRRIGATION AND DEBRIDEMENT WOUND;  Surgeon: Loel Lofty Makiyah Zentz, DO;  Location: [place];  Service: Plastics;  Laterality: N/A;  . Application of wound vac N/A 11/21/2015    Procedure: APPLICATION OF WOUND VAC;  Surgeon: Loel Lofty Jaslynn Thome, DO;  Location: [place];  Service: Plastics;  Laterality: N/A;  . Application of a-cell of chest/abdomen N/A 11/21/2015    Procedure: APPLICATION OF A-CELL OF CHEST/ABDOMEN;  Surgeon: Loel Lofty Jacara Benito, DO;  Location:  [place];  Service: Plastics;  Laterality: N/A;  . Tram Right 11/18/2015    Procedure: VRAM Vertical Rectus Abdominus Muscle Flap;  Surgeon: Loel Lofty Orey Moure, DO;  Location: [place];  Service: Plastics;  Laterality: Right;  RIght Back  . I&d extremity Right 12/06/2015    Procedure: IRRIGATION AND DEBRIDEMENT RIGHT CHEST WALL WITH ACELL PLACEMENT AND VAC;  Surgeon: Loel Lofty Isaly Fasching, DO;  Location: [place];  Service: Plastics;  Laterality: Right;  . Incision and drainage of wound Right 12/21/2015    Procedure: IRRIGATION AND DEBRIDEMENT right chest wall WOUND;  Surgeon: Loel Lofty Dracen Reigle, DO;  Location: [place];  Service: Plastics;  Laterality: Right;  right chest wall   . Application of  wound vac Right 12/21/2015    Procedure: APPLICATION OF WOUND VAC to right chest wall ;  Surgeon: Loel Lofty Clemmie Marxen, DO;  Location: [place];  Service: Plastics;  Laterality: Right;  . Application of a-cell of chest/abdomen Right 12/21/2015    Procedure: APPLICATION OF A-CELL OF RIGHT CHEST;  Surgeon: Loel Lofty Conchita Truxillo, DO;  Location: [place];  Service: Plastics;  Laterality: Right;  . Irrigation and debridement of wound with split thickness skin graft Right 01/11/2016    Procedure: IRRIGATION AND DEBRIDEMENT OF RIGHT CHEST WOUND ;  Surgeon: Loel Lofty Salil Raineri, DO;  Location: [place];  Service: Plastics;  Laterality: Right;  . Application of a-cell of chest/abdomen Right 01/11/2016    Procedure: APPLICATION OF A-CELL OF RIGHT CHEST;  Surgeon: Loel Lofty Wiatt Mahabir, DO;  Location: [place];  Service: Plastics;  Laterality: Right;  . Application of wound vac Right 01/11/2016    Procedure: APPLICATION OF WOUND VAC RIGHT CHEST ;  Surgeon: Loel Lofty Terry Bolotin, DO;  Location: [place];  Service: Plastics;  Laterality: Right;    Family History  Problem Relation Age of Onset  . Heart disease Father   . Heart disease Brother   . Heart attack Paternal Uncle    Social History:  reports that she quit smoking about 17 years ago. Her smoking use  included Cigarettes. She has a 60 pack-year smoking history. She has never used smokeless tobacco. She reports that she drinks alcohol. She reports that she does not use illicit drugs.  Allergies:  Allergies  Allergen Reactions  . Ace Inhibitors Cough  . Augmentin [Amoxicillin-Pot Clavulanate] Swelling    JOINTS, PAIN  . Penicillins Swelling    Swollen joints, Has patient had a PCN reaction causing immediate rash, facial/tongue/throat swelling, SOB or lightheadedness with hypotension: Yes Has patient had a PCN reaction causing severe rash involving mucus membranes or skin necrosis: No Has patient had a PCN reaction that required hospitalization No Has patient had a PCN reaction occurring within the last 10 years: No If all of the above answers are "NO", then may proceed with Cephalosporin use.   . Nifedipine Other (See Comments)    UNSPECIFIED  . Propranolol Other (See Comments)    UNSPECIFIED  . Diazepam Other (See Comments)    Made her "hyper"  . Meperidine Other (See Comments)    Made her "hyper"  . Propoxyphene Other (See Comments)    Made her "hyper"    No prescriptions prior to admission    No results found for this or any previous visit (from the past 48 hour(s)). No results found.  Review of Systems  Constitutional: Negative.   HENT: Negative.   Eyes: Negative.   Respiratory: Negative.   Cardiovascular: Negative.   Gastrointestinal: Negative.   Genitourinary: Negative.   Musculoskeletal: Positive for joint pain.  Skin: Negative.   Neurological: Negative.   Psychiatric/Behavioral: Negative.     There were no vitals taken for this visit. Physical Exam  Constitutional: She is oriented to person, place, and time. She appears well-developed and well-nourished.  HENT:  Head: Normocephalic and atraumatic.  Eyes: Conjunctivae and EOM are normal. Pupils are equal, round, and reactive to light.  Cardiovascular: Normal rate.   GI: Soft.  Neurological: She is alert  and oriented to person, place, and time.  Psychiatric: She has a normal mood and affect. Her behavior is normal. Judgment and thought content normal.     Assessment/Plan Debridement of right knee with acell and VAC placement.  Adoria Kawamoto  [place], DO 01/24/2016, 8:16 AM

## 2016-01-24 NOTE — Brief Op Note (Addendum)
01/24/2016  2:05 PM  PATIENT:  [name]  81 y.o. female  PRE-OPERATIVE DIAGNOSIS:  right chest wall wound  POST-OPERATIVE DIAGNOSIS:  same  PROCEDURE:  Procedure(s): IRRIGATION AND DEBRIDEMENT RIGHT CHEST WALL WOUND POSSIBLE SKIN GRAFT (Right)  SURGEON:  Surgeon(s) and Role:    * Loel Lofty Latham Kinzler, DO - Primary  PHYSICIAN ASSISTANT: Shawn Rayburn, PA  ASSISTANTS: none   ANESTHESIA:   general  EBL:     BLOOD ADMINISTERED:none  DRAINS: none   LOCAL MEDICATIONS USED:  NONE  SPECIMEN:  No Specimen  DISPOSITION OF SPECIMEN:  N/A  COUNTS:  YES  TOURNIQUET:  * No tourniquets in log *  DICTATION: .Dragon Dictation  PLAN OF CARE: Discharge to home after PACU  PATIENT DISPOSITION:  PACU - hemodynamically stable.   Delay start of Pharmacological VTE agent (>24hrs) due to surgical blood loss or risk of bleeding: no

## 2016-01-24 NOTE — Anesthesia Postprocedure Evaluation (Signed)
Anesthesia Post Note  Patient: [name]  Procedure(s) Performed: Procedure(s) (LRB): IRRIGATION AND DEBRIDEMENT RIGHT CHEST WALL WOUND (Right) APPLICATION OF WOUND VAC RIGHT CHEST WALL (Right)  Patient location during evaluation: PACU Anesthesia Type: General Level of consciousness: awake and alert Pain management: pain level controlled Vital Signs Assessment: post-procedure vital signs reviewed and stable Respiratory status: spontaneous breathing, nonlabored ventilation, respiratory function stable and patient connected to nasal cannula oxygen Cardiovascular status: blood pressure returned to baseline and stable Postop Assessment: no signs of nausea or vomiting Anesthetic complications: no    Last Vitals:  Filed Vitals:   01/24/16 1525 01/24/16 1538  BP: 128/48 132/56  Pulse: 60 65  Temp: 36.6 C   Resp: 15 18    Last Pain:  Filed Vitals:   01/24/16 1539  PainSc: 0-No pain                 Effie Berkshire

## 2016-01-25 ENCOUNTER — Encounter (HOSPITAL_COMMUNITY): Payer: Self-pay | Admitting: Plastic Surgery

## 2016-01-28 ENCOUNTER — Telehealth: Payer: Self-pay | Admitting: *Deleted

## 2016-01-28 ENCOUNTER — Encounter: Payer: Self-pay | Admitting: *Deleted

## 2016-01-28 DIAGNOSIS — Z951 Presence of aortocoronary bypass graft: Secondary | ICD-10-CM

## 2016-01-28 DIAGNOSIS — Z952 Presence of prosthetic heart valve: Secondary | ICD-10-CM

## 2016-01-28 NOTE — Telephone Encounter (Signed)
Called to check on status to return to program. Doing better, needs a skin graft soon and will be out until then.

## 2016-01-28 NOTE — Progress Notes (Signed)
Cardiac Individual Treatment Plan  Patient Details  Name: [name] MRN: [id number] Date of Birth: November 26, 1934 Referring Provider:    Initial Encounter Date:       Cardiac Rehab from 07/03/2015 in [facility] Cardiac and Pulmonary Rehab   Date  07/03/15      Visit Diagnosis: S/P MVR (mitral valve replacement)  S/P CABG x 1  Patient's Home Medications on Admission:  Current outpatient prescriptions:  .  acetaminophen (TYLENOL) 325 MG tablet, Take 2 tablets (650 mg total) by mouth every 4 (four) hours as needed for mild pain (or Fever >/= 101)., Disp: , Rfl:  .  aspirin EC 81 MG EC tablet, Take 1 tablet (81 mg total) by mouth daily., Disp: , Rfl:  .  bisoprolol (ZEBETA) 10 MG tablet, Take 1 tablet (10 mg total) by mouth daily., Disp: 30 tablet, Rfl: 5 .  fluticasone (FLONASE) 50 MCG/ACT nasal spray, Place 1 spray into both nostrils daily as needed for allergies. , Disp: , Rfl:  .  furosemide (LASIX) 20 MG tablet, Take 20 mg by mouth 2 (two) times daily. , Disp: , Rfl:  .  gabapentin (NEURONTIN) 100 MG capsule, Take 100 mg by mouth at bedtime. , Disp: , Rfl: 1 .  ipratropium-albuterol (DUONEB) 0.5-2.5 (3) MG/3ML SOLN, Take 3 mLs by nebulization every 6 (six) hours as needed (for shortness of breath)., Disp: 360 mL, Rfl: 10 .  levothyroxine (SYNTHROID, LEVOTHROID) 88 MCG tablet, Take 88 mcg by mouth daily before breakfast., Disp: , Rfl:  .  losartan (COZAAR) 100 MG tablet, Take 1 tablet (100 mg total) by mouth daily., Disp: 30 tablet, Rfl: 3 .  pantoprazole (PROTONIX) 40 MG tablet, Take 40 mg by mouth 2 (two) times daily. , Disp: , Rfl:  .  potassium chloride SA (K-DUR,KLOR-CON) 20 MEQ tablet, Take 20 mEq by mouth daily., Disp: , Rfl: 2 .  simvastatin (ZOCOR) 20 MG tablet, Take 20 mg by mouth daily., Disp: , Rfl:  .  spironolactone (ALDACTONE) 25 MG tablet, Take 1 tablet (25 mg total) by mouth daily., Disp: 30 tablet, Rfl: 6 .  traMADol (ULTRAM) 50 MG tablet, Take 1 tablet (50 mg total) by  mouth every 12 (twelve) hours as needed., Disp: 30 tablet, Rfl: 0 .  traMADol (ULTRAM) 50 MG tablet, Take 1 tablet (50 mg total) by mouth every 6 (six) hours as needed for moderate pain or severe pain., Disp: 30 tablet, Rfl: 0 .  warfarin (COUMADIN) 2.5 MG tablet, TAKE 1 TABLET BY MOUTH AS DIRECTED (Patient taking differently: 2.5 mg on Mon, Wed. and Sat, 3.75 mg on Sunday, Tuesday, Thursday, and Friday), Disp: 30 tablet, Rfl: 2  Past Medical History: Past Medical History  Diagnosis Date  . Hypertension   . COPD (chronic obstructive pulmonary disease) ([place])   . Hyperlipidemia   . Meniere disease   . PAF (paroxysmal atrial fibrillation) ([place])   . COPD (chronic obstructive pulmonary disease) ([place])   . Hypothyroidism   . Pulmonary hypertension ([place])   . Chronic diastolic CHF (congestive heart failure) ([place])   . PONV (postoperative nausea and vomiting)   . Anxiety   . GERD (gastroesophageal reflux disease)   . Severe Mitral Regurgitation s/p MV Repair     a. 04/2015 s/p 27 mm [facility] Mitral bovine bioprosthetic tissue valve  . Tricuspid Regurgitation s/p Repair     a. 04/2015 s/p 26 mm [facility] mc3 ring annuloplasty  . Maze operation for AF w/ LAA clipping  a. 04/2015 Complete bilateral atrial lesion set using cryothermy and bipolar radiofrequency ablation with clipping of LA appendage  . CAD s/p CABG x 1     a. 04/2015 LIMA to LAD  . Post-surgical complete heart block, symptomatic     a. 04/2015 s/p MDT [id number] Advisa DR MRI DC PPM.  . Wound infection ([place]) 10/13/2015    Superficial sternal wound infection  . Cancer of right breast ([place]) 08/09/2001  . Presence of permanent cardiac pacemaker   . On home oxygen therapy     "2L at night" (11/10/2015)  . Pneumonia ~ 2010  . Arthritis     "hands" (11/10/2015)  . Surgical wound, non healing - chest wall 11/10/2015    Tobacco Use: History  Smoking status  . Former Smoker -- 1.50 packs/day for 40 years  . Types: Cigarettes  . Quit date:  04/20/1998  Smokeless tobacco  . Never Used    Labs: Recent Review Flowsheet Data    Labs for ITP Cardiac and Pulmonary Rehab Latest Ref Rng 04/28/2015 04/29/2015 04/30/2015 11/13/2015 11/14/2015   PHART 7.350 - 7.450 - - - 7.428 7.420   PCO2ART 35.0 - 45.0 mmHg - - - 41.1 49.4(H)   HCO3 20.0 - 24.0 mEq/L - - - 26.7(H) 31.5(H)   TCO2 0 - 100 mmol/L - 34 34 27.9 33.0   O2SAT - 65.3 61.7 - 95.2 98.4       Exercise Target Goals:    Exercise Program Goal: Individual exercise prescription set with THRR, safety & activity barriers. Participant demonstrates ability to understand and report RPE using BORG scale, to self-measure pulse accurately, and to acknowledge the importance of the exercise prescription.  Exercise Prescription Goal: Starting with aerobic activity 30 plus minutes a day, 3 days per week for initial exercise prescription. Provide home exercise prescription and guidelines that participant acknowledges understanding prior to discharge.  Activity Barriers & Risk Stratification:   6 Minute Walk:   Initial Exercise Prescription:   Perform Capillary Blood Glucose checks as needed.  Exercise Prescription Changes:     Exercise Prescription Changes      08/07/15 1500 08/09/15 0750 09/26/15 0800 10/23/15 1100 11/20/15 1200   Exercise Review   Progression Yes No No  Absent since last review; last visit 08/09/15 No  Absent since last review; last visit 08/09/15 No  Absent since last review; last visit 08/09/15   Response to Exercise   Blood Pressure (Admit) 108/62 mmHg 116/64 mmHg      Blood Pressure (Exercise)  118/64 mmHg      Blood Pressure (Exit) 112/70 mmHg 108/56 mmHg      Heart Rate (Admit) 76 bpm 76 bpm      Heart Rate (Exercise) 101 bpm 105 bpm      Heart Rate (Exit) 71 bpm 73 bpm      Rating of Perceived Exertion (Exercise) 13 13      Symptoms None None      Comments [name] is getting stronger and is especially encouraged on the treadmill. She can now walk for  20 minutes without stopping. She can walk into exercise class without oxygen now too and she was very proud of that.  [name] has been out for almost a month and will continue to be out due to illness and medical reasons. No more exercise progression will be made and we will meet with her individually to adjust workloads based on her exercise capacity when she returns  Ex. Rx. will be re evaluated  upon return due to extended absence Ex. Rx. will be re evaluated upon return due to extended absence Ex. Rx. will be re evaluated upon return due to extended absence   Duration Progress to 50 minutes of aerobic without signs/symptoms of physical distress Progress to 50 minutes of aerobic without signs/symptoms of physical distress Progress to 50 minutes of aerobic without signs/symptoms of physical distress Progress to 50 minutes of aerobic without signs/symptoms of physical distress Progress to 50 minutes of aerobic without signs/symptoms of physical distress   Intensity Rest + 30 Rest + 30 Rest + 30 Rest + 30 Rest + 30   Progression   Progression Continue progressive overload as per policy without signs/symptoms or physical distress. Continue progressive overload as per policy without signs/symptoms or physical distress. Continue progressive overload as per policy without signs/symptoms or physical distress. Continue progressive overload as per policy without signs/symptoms or physical distress. Continue progressive overload as per policy without signs/symptoms or physical distress.   Resistance Training   Training Prescription (read-only) Yes Yes Yes Yes    Weight (read-only) 2 2 2 2     Reps (read-only) 10-15 10-15 10-15 10-15    Treadmill   MPH (read-only) 2 2 2 2     Grade (read-only) 0 0 0 0    Minutes (read-only) 20 20 20 20     REL-XR   Level (read-only) 5 5 5 5     Watts (read-only) 45 45 45 45    Minutes (read-only) 15 15 15 15        Exercise Comments:     Exercise Comments      12/27/15 0654  01/24/16 0659         Exercise Comments The patient has not attended class since the last review. There has been no progress made due to lack of attendance. Workloads may need to be adjusted if the patient returns to class.  The patient has not attended class since the last review. There has been no progress made due to lack of attendance. Workloads may need to be adjusted if the patient returns to class.          Discharge Exercise Prescription (Final Exercise Prescription Changes):     Exercise Prescription Changes - 11/20/15 1200    Exercise Review   Progression No  Absent since last review; last visit 08/09/15   Response to Exercise   Comments Ex. Rx. will be re evaluated upon return due to extended absence   Duration Progress to 50 minutes of aerobic without signs/symptoms of physical distress   Intensity Rest + 30   Progression   Progression Continue progressive overload as per policy without signs/symptoms or physical distress.      Nutrition:  Target Goals: Understanding of nutrition guidelines, daily intake of sodium 1500mg , cholesterol 200mg , calories 30% from fat and 7% or less from saturated fats, daily to have 5 or more servings of fruits and vegetables.  Biometrics:    Nutrition Therapy Plan and Nutrition Goals:   Nutrition Discharge: Rate Your Plate Scores:   Nutrition Goals Re-Evaluation:     Nutrition Goals Re-Evaluation      11/27/15 1359           Personal Goal #1 Re-Evaluation   Personal Goal #1 Lucie has been out since 08/09/2015 due to wound debridement. Noted that Jaylise recently had a Irrigation and debridment of her chest wall per another dept EpIC charting.        Goal Progress Seen No  Psychosocial: Target Goals: Acknowledge presence or absence of depression, maximize coping skills, provide positive support system. Participant is able to verbalize types and ability to use techniques and skills needed for reducing stress and  depression.  Initial Review & Psychosocial Screening:   Quality of Life Scores:   PHQ-9:     Recent Review Flowsheet Data    Depression screen [facility] 2/9 07/03/2015   Decreased Interest 0   Down, Depressed, Hopeless 0   PHQ - 2 Score 0   Altered sleeping 0   Tired, decreased energy 3   Change in appetite 0   Feeling bad or failure about yourself  0   Trouble concentrating 0   Moving slowly or fidgety/restless 0   Suicidal thoughts 0   PHQ-9 Score 3   Difficult doing work/chores Somewhat difficult      Psychosocial Evaluation and Intervention:   Psychosocial Re-Evaluation:     Psychosocial Re-Evaluation      11/13/15 1422 11/27/15 1425         Psychosocial Re-Evaluation   Comments I left vm for Chianne since she called on 11/06/2015 to see if she could return. I read EPIC note from today that mentioned an Infection and Wound debridement so I suggested she return after things get better for her. I called Mimi's home and her husband answered. He said she just got out of [facility] for another debridment of her chest wall.          Vocational Rehabilitation: Provide vocational rehab assistance to qualifying candidates.   Vocational Rehab Evaluation & Intervention:   Education: Education Goals: Education classes will be provided on a weekly basis, covering required topics. Participant will state understanding/return demonstration of topics presented.  Learning Barriers/Preferences:   Education Topics: General Nutrition Guidelines/Fats and Fiber: -Group instruction provided by verbal, written material, models and posters to present the general guidelines for heart healthy nutrition. Gives an explanation and review of dietary fats and fiber.   Controlling Sodium/Reading Food Labels: -Group verbal and written material supporting the discussion of sodium use in heart healthy nutrition. Review and explanation with models, verbal and written materials for  utilization of the food label.   Exercise Physiology & Risk Factors: - Group verbal and written instruction with models to review the exercise physiology of the cardiovascular system and associated critical values. Details cardiovascular disease risk factors and the goals associated with each risk factor.          Cardiac Rehab from 08/09/2015 in [facility] Cardiac and Pulmonary Rehab   Date  07/12/15   Educator  RM   Instruction Review Code  2- meets goals/outcomes      Aerobic Exercise & Resistance Training: - Gives group verbal and written discussion on the health impact of inactivity. On the components of aerobic and resistive training programs and the benefits of this training and how to safely progress through these programs.      Cardiac Rehab from 08/09/2015 in [facility] Cardiac and Pulmonary Rehab   Date  07/17/15   Educator  RM   Instruction Review Code  2- meets goals/outcomes      Flexibility, Balance, General Exercise Guidelines: - Provides group verbal and written instruction on the benefits of flexibility and balance training programs. Provides general exercise guidelines with specific guidelines to those with heart or lung disease. Demonstration and skill practice provided.      Cardiac Rehab from 08/09/2015 in [facility] Cardiac and Pulmonary Rehab   Date  07/24/15  Educator  RM   Instruction Review Code  2- meets goals/outcomes      Stress Management: - Provides group verbal and written instruction about the health risks of elevated stress, cause of high stress, and healthy ways to reduce stress.      Cardiac Rehab from 08/09/2015 in [facility] Cardiac and Pulmonary Rehab   Date  07/26/15   Educator  [facility]   Instruction Review Code  2- meets goals/outcomes      Depression: - Provides group verbal and written instruction on the correlation between heart/lung disease and depressed mood, treatment options, and the stigmas associated with seeking treatment.   Anatomy & Physiology of  the Heart: - Group verbal and written instruction and models provide basic cardiac anatomy and physiology, with the coronary electrical and arterial systems. Review of: AMI, Angina, Valve disease, Heart Failure, Cardiac Arrhythmia, Pacemakers, and the ICD.   Cardiac Procedures: - Group verbal and written instruction and models to describe the testing methods done to diagnose heart disease. Reviews the outcomes of the test results. Describes the treatment choices: Medical Management, Angioplasty, or Coronary Bypass Surgery.   Cardiac Medications: - Group verbal and written instruction to review commonly prescribed medications for heart disease. Reviews the medication, class of the drug, and side effects. Includes the steps to properly store meds and maintain the prescription regimen.   Go Sex-Intimacy & Heart Disease, Get SMART - Goal Setting: - Group verbal and written instruction through game format to discuss heart disease and the return to sexual intimacy. Provides group verbal and written material to discuss and apply goal setting through the application of the S.M.A.R.T. Method.   Other Matters of the Heart: - Provides group verbal, written materials and models to describe Heart Failure, Angina, Valve Disease, and Diabetes in the realm of heart disease. Includes description of the disease process and treatment options available to the cardiac patient.      Cardiac Rehab from 08/09/2015 in [facility] Cardiac and Pulmonary Rehab   Date  08/09/15   Educator  SB   Instruction Review Code  2- meets goals/outcomes      Exercise & Equipment Safety: - Individual verbal instruction and demonstration of equipment use and safety with use of the equipment.      Cardiac Rehab from 08/09/2015 in [facility] Cardiac and Pulmonary Rehab   Date  07/03/15   Educator  SB   Instruction Review Code  2- meets goals/outcomes      Infection Prevention: - Provides verbal and written material to individual with  discussion of infection control including proper hand washing and proper equipment cleaning during exercise session.      Cardiac Rehab from 08/09/2015 in [facility] Cardiac and Pulmonary Rehab   Date  07/03/15   Educator  Sb   Instruction Review Code  2- meets goals/outcomes      Falls Prevention: - Provides verbal and written material to individual with discussion of falls prevention and safety.      Cardiac Rehab from 08/09/2015 in [facility] Cardiac and Pulmonary Rehab   Date  07/03/15   Educator  Sb   Instruction Review Code  2- meets goals/outcomes      Diabetes: - Individual verbal and written instruction to review signs/symptoms of diabetes, desired ranges of glucose level fasting, after meals and with exercise. Advice that pre and post exercise glucose checks will be done for 3 sessions at entry of program.    Knowledge Questionnaire Score:   Core Components/Risk Factors/Patient Goals at  Admission:   Core Components/Risk Factors/Patient Goals Review:      Goals and Risk Factor Review      08/02/15 1511 09/26/15 0826 10/23/15 1108 11/13/15 1422 11/27/15 1401   Core Components/Risk Factors/Patient Goals Review   Personal Goals Review Increase Aerobic Exercise and Physical Activity;Hypertension;Lipids    Sedentary   Review    I left vm for Aunesty since she called on 11/06/2015 to see if she could return. I read EPIC note from today that mentioned an Infection and Wound debridement so I suggested she return after things get better for her. Labrea has been out since 08/09/2015 due to wound debridement. Noted that Alencia recently had a Irrigation and debridment of her chest wall per another dept EpIC charting.    Expected Outcomes     Clear of infection to be able to exercise.    Increase Aerobic Exercise and Physical Activity (read-only)   Goals Progress/Improvement seen  Yes No No     Comments Saraann stated today she can bend down and tie her shoes.  Today she was having a flare of her  bursitis in her leg. No treadmill time because of the bursitis pain.  She is walking more at home and her husband bought her a cane to help her stay balanced. We talked about the distance it is to walk from the hosptal entrance to the rehab gym. Her husband wheels her down. Her goal is to be able to walk from the class to the front door without wheelchair assistance by Dec 19th.  Ex. Rx. will be re-evaluated upon return due to extende absence Ex. Rx. will be re-evaluated upon return due to extende absence     Hypertension (read-only)   Progress seen toward goals Yes       Comments Cabela's BP remains in normal range. She will see RD in Dec on the 19th to review nutrition plan.        Abnormal Lipids (read-only)   Progress seen towards goals Unknown       Comments No labs to review. Rodneisha will have an appointment with the RD in December.          11/27/15 1424           Core Components/Risk Factors/Patient Goals Review   Personal Goals Review Sedentary       Review I called Larin's home and her husband answered. He said she just got out of [facility] for another debridment of her chest wall.           Core Components/Risk Factors/Patient Goals at Discharge (Final Review):      Goals and Risk Factor Review - 11/27/15 1424    Core Components/Risk Factors/Patient Goals Review   Personal Goals Review Sedentary   Review I called Laporshia's home and her husband answered. He said she just got out of [facility] for another debridment of her chest wall.       ITP Comments:     ITP Comments      08/14/15 1211 09/10/15 1215 10/05/15 1131 10/31/15 0751 11/13/15 1422   ITP Comments 30 day review preparation: Continue with ITP. Ready for 30 day review.  Continue with ITP  Remains out with medical concerns.  Has appoinment mid JAn with cardiologist regarding pacemaker qustions. Ready for 30 day review. Continue with ITP. Remains out with medical reasons 30 day review.  Continue with ITP.  Continues to remain out for medical reasons I left vm for Kruti  since she called on 11/06/2015 to see if she could return. I read EPIC note from today that mentioned an Infection and Wound debridement so I suggested she return after things get better for her.     11/30/15 1244 12/31/15 0925 01/28/16 1212       ITP Comments 30 Day Review. Continue with the ITP. 30 day review.  Continue with ITP   remains out with medical concerns 30 day review. Continue with ITP. Remains out for medical reason. Hopes to return when wound vac removed and skin graft completed        Comments:

## 2016-01-31 ENCOUNTER — Ambulatory Visit (INDEPENDENT_AMBULATORY_CARE_PROVIDER_SITE_OTHER): Payer: Medicare Other | Admitting: Interventional Cardiology

## 2016-01-31 DIAGNOSIS — I340 Nonrheumatic mitral (valve) insufficiency: Secondary | ICD-10-CM

## 2016-01-31 DIAGNOSIS — Z953 Presence of xenogenic heart valve: Secondary | ICD-10-CM

## 2016-01-31 DIAGNOSIS — I4891 Unspecified atrial fibrillation: Secondary | ICD-10-CM

## 2016-01-31 LAB — POCT INR: INR: 1.9

## 2016-02-07 ENCOUNTER — Ambulatory Visit (INDEPENDENT_AMBULATORY_CARE_PROVIDER_SITE_OTHER): Payer: Medicare Other | Admitting: Internal Medicine

## 2016-02-07 DIAGNOSIS — I340 Nonrheumatic mitral (valve) insufficiency: Secondary | ICD-10-CM

## 2016-02-07 DIAGNOSIS — I4891 Unspecified atrial fibrillation: Secondary | ICD-10-CM

## 2016-02-07 DIAGNOSIS — Z953 Presence of xenogenic heart valve: Secondary | ICD-10-CM

## 2016-02-07 LAB — POCT INR: INR: 2.7

## 2016-02-08 LAB — CUP PACEART REMOTE DEVICE CHECK
Battery Voltage: 3.02 V
Brady Statistic AP VP Percent: 1.73 %
Brady Statistic AP VS Percent: 87.33 %
Brady Statistic AS VS Percent: 10.76 %
Brady Statistic RA Percent Paced: 89.06 %
Date Time Interrogation Session: 20170425123710
Implantable Lead Implant Date: 20160823
Implantable Lead Location: 753860
Lead Channel Impedance Value: 361 Ohm
Lead Channel Impedance Value: 475 Ohm
Lead Channel Pacing Threshold Amplitude: 0.5 V
Lead Channel Pacing Threshold Amplitude: 0.5 V
Lead Channel Pacing Threshold Pulse Width: 0.4 ms
Lead Channel Pacing Threshold Pulse Width: 0.4 ms
Lead Channel Sensing Intrinsic Amplitude: 9.75 mV
Lead Channel Setting Pacing Amplitude: 2 V
Lead Channel Setting Pacing Pulse Width: 0.4 ms
Lead Channel Setting Sensing Sensitivity: 2.8 mV
MDC IDC LEAD IMPLANT DT: 20160823
MDC IDC LEAD LOCATION: 753859
MDC IDC MSMT BATTERY REMAINING LONGEVITY: 109 mo
MDC IDC MSMT LEADCHNL RA IMPEDANCE VALUE: 456 Ohm
MDC IDC MSMT LEADCHNL RA SENSING INTR AMPL: 2.125 mV
MDC IDC MSMT LEADCHNL RA SENSING INTR AMPL: 2.125 mV
MDC IDC MSMT LEADCHNL RV IMPEDANCE VALUE: 418 Ohm
MDC IDC MSMT LEADCHNL RV SENSING INTR AMPL: 9.75 mV
MDC IDC SET LEADCHNL RV PACING AMPLITUDE: 2.5 V
MDC IDC STAT BRADY AS VP PERCENT: 0.18 %
MDC IDC STAT BRADY RV PERCENT PACED: 1.91 %

## 2016-02-09 ENCOUNTER — Encounter (HOSPITAL_COMMUNITY): Payer: Self-pay | Admitting: *Deleted

## 2016-02-09 ENCOUNTER — Ambulatory Visit: Payer: Self-pay | Admitting: Physician Assistant

## 2016-02-09 ENCOUNTER — Telehealth: Payer: Self-pay | Admitting: Pharmacist

## 2016-02-09 NOTE — Telephone Encounter (Signed)
Pt called to report she is having a skin graft on Monday.  She has previously held her Coumadin for 2 days prior to these procedures but MD would like her to start holding today given it is more invasive than previous procedures.  She in on Coumadin for Afib.  Her valve replacements were bioprosthetic.  Her CHADS score is 4 with no history of stroke or TIA.  Per protocol, okay to hold Coumadin x 3 days.  Instructed pt to restart previous dose on Monday after her procedure and she has follow up planned for 6/14 in the Coumadin Clinic.

## 2016-02-09 NOTE — Progress Notes (Signed)
Pt SDW-Pre-op call completed by pt spouse, Dorothyann Peng with pt verbal consent. Spouse stated that pt was instructed to stop Coumadin today and to continue taking Aspirin. Spouse verbalized understanding of all pre-op instructions.

## 2016-02-11 MED ORDER — CIPROFLOXACIN IN D5W 400 MG/200ML IV SOLN
400.0000 mg | INTRAVENOUS | Status: AC
Start: 2016-02-12 — End: 2016-02-12
  Administered 2016-02-12: 400 mg via INTRAVENOUS
  Filled 2016-02-11: qty 200

## 2016-02-12 ENCOUNTER — Ambulatory Visit (HOSPITAL_COMMUNITY): Payer: Medicare Other | Admitting: Certified Registered Nurse Anesthetist

## 2016-02-12 ENCOUNTER — Ambulatory Visit (HOSPITAL_COMMUNITY)
Admission: RE | Admit: 2016-02-12 | Discharge: 2016-02-12 | Disposition: A | Discharge status: Home / Self Care | Payer: Medicare Other | Source: Ambulatory Visit | Attending: Plastic Surgery | Admitting: Plastic Surgery

## 2016-02-12 ENCOUNTER — Other Ambulatory Visit: Payer: Self-pay | Admitting: Plastic Surgery

## 2016-02-12 ENCOUNTER — Encounter (HOSPITAL_COMMUNITY)
Admission: RE | Disposition: A | Discharge status: Home / Self Care | Payer: Self-pay | Source: Ambulatory Visit | Attending: Plastic Surgery

## 2016-02-12 ENCOUNTER — Encounter (HOSPITAL_COMMUNITY): Payer: Self-pay | Admitting: Plastic Surgery

## 2016-02-12 DIAGNOSIS — K219 Gastro-esophageal reflux disease without esophagitis: Secondary | ICD-10-CM | POA: Diagnosis not present

## 2016-02-12 DIAGNOSIS — Y838 Other surgical procedures as the cause of abnormal reaction of the patient, or of later complication, without mention of misadventure at the time of the procedure: Secondary | ICD-10-CM | POA: Diagnosis not present

## 2016-02-12 DIAGNOSIS — I110 Hypertensive heart disease with heart failure: Secondary | ICD-10-CM | POA: Insufficient documentation

## 2016-02-12 DIAGNOSIS — I5032 Chronic diastolic (congestive) heart failure: Secondary | ICD-10-CM | POA: Diagnosis not present

## 2016-02-12 DIAGNOSIS — Z7984 Long term (current) use of oral hypoglycemic drugs: Secondary | ICD-10-CM | POA: Diagnosis not present

## 2016-02-12 DIAGNOSIS — Z951 Presence of aortocoronary bypass graft: Secondary | ICD-10-CM | POA: Diagnosis not present

## 2016-02-12 DIAGNOSIS — E785 Hyperlipidemia, unspecified: Secondary | ICD-10-CM | POA: Diagnosis not present

## 2016-02-12 DIAGNOSIS — Z853 Personal history of malignant neoplasm of breast: Secondary | ICD-10-CM | POA: Diagnosis not present

## 2016-02-12 DIAGNOSIS — Z952 Presence of prosthetic heart valve: Secondary | ICD-10-CM | POA: Insufficient documentation

## 2016-02-12 DIAGNOSIS — E039 Hypothyroidism, unspecified: Secondary | ICD-10-CM | POA: Insufficient documentation

## 2016-02-12 DIAGNOSIS — Z87891 Personal history of nicotine dependence: Secondary | ICD-10-CM | POA: Diagnosis not present

## 2016-02-12 DIAGNOSIS — Z79899 Other long term (current) drug therapy: Secondary | ICD-10-CM | POA: Diagnosis not present

## 2016-02-12 DIAGNOSIS — J449 Chronic obstructive pulmonary disease, unspecified: Secondary | ICD-10-CM | POA: Diagnosis not present

## 2016-02-12 DIAGNOSIS — T8189XA Other complications of procedures, not elsewhere classified, initial encounter: Secondary | ICD-10-CM | POA: Diagnosis present

## 2016-02-12 DIAGNOSIS — F419 Anxiety disorder, unspecified: Secondary | ICD-10-CM | POA: Diagnosis not present

## 2016-02-12 HISTORY — PX: SKIN SPLIT GRAFT: SHX444

## 2016-02-12 HISTORY — PX: APPLICATION OF WOUND VAC: SHX5189

## 2016-02-12 HISTORY — PX: APPLICATION OF A-CELL OF CHEST/ABDOMEN: SHX6302

## 2016-02-12 LAB — BASIC METABOLIC PANEL
Anion gap: 8 (ref 5–15)
BUN: 16 mg/dL (ref 6–20)
CHLORIDE: 103 mmol/L (ref 101–111)
CO2: 29 mmol/L (ref 22–32)
CREATININE: 0.7 mg/dL (ref 0.44–1.00)
Calcium: 9.5 mg/dL (ref 8.9–10.3)
GFR calc Af Amer: >60 mL/min (ref >60)
GFR calc non Af Amer: >60 mL/min (ref >60)
GLUCOSE: 125 mg/dL — AB (ref 65–99)
POTASSIUM: 4.8 mmol/L (ref 3.5–5.1)
SODIUM: 140 mmol/L (ref 135–145)

## 2016-02-12 LAB — CBC
HEMATOCRIT: 32 % — AB (ref 36.0–46.0)
Hemoglobin: 9.4 g/dL — ABNORMAL LOW (ref 12.0–15.0)
MCH: 26.6 pg (ref 26.0–34.0)
MCHC: 29.4 g/dL — AB (ref 30.0–36.0)
MCV: 90.4 fL (ref 78.0–100.0)
PLATELETS: 281 10*3/uL (ref 150–400)
RBC: 3.54 MIL/uL — ABNORMAL LOW (ref 3.87–5.11)
RDW: 16.2 % — AB (ref 11.5–15.5)
WBC: 9.9 10*3/uL (ref 4.0–10.5)

## 2016-02-12 LAB — PROTIME-INR
INR: 1.42 (ref 0.00–1.49)
Prothrombin Time: 17.4 seconds — ABNORMAL HIGH (ref 11.6–15.2)

## 2016-02-12 SURGERY — APPLICATION, GRAFT, SKIN, SPLIT-THICKNESS
Anesthesia: General | Site: Chest | Laterality: Right

## 2016-02-12 MED ORDER — PHENYLEPHRINE HCL 10 MG/ML IJ SOLN
INTRAMUSCULAR | Status: DC | PRN
  Administered 2016-02-12: 40 ug via INTRAVENOUS
  Administered 2016-02-12 (×2): 80 ug via INTRAVENOUS

## 2016-02-12 MED ORDER — PHENYLEPHRINE 40 MCG/ML (10ML) SYRINGE FOR IV PUSH (FOR BLOOD PRESSURE SUPPORT)
PREFILLED_SYRINGE | INTRAVENOUS | Status: AC
  Filled 2016-02-12: qty 20

## 2016-02-12 MED ORDER — LIDOCAINE 2% (20 MG/ML) 5 ML SYRINGE
INTRAMUSCULAR | Status: AC
  Filled 2016-02-12: qty 15

## 2016-02-12 MED ORDER — FENTANYL CITRATE (PF) 250 MCG/5ML IJ SOLN
INTRAMUSCULAR | Status: AC
  Filled 2016-02-12: qty 5

## 2016-02-12 MED ORDER — EPINEPHRINE HCL 1 MG/ML IJ SOLN
INTRAMUSCULAR | Status: AC
  Filled 2016-02-12: qty 1

## 2016-02-12 MED ORDER — GLYCOPYRROLATE 0.2 MG/ML IV SOSY
PREFILLED_SYRINGE | INTRAVENOUS | Status: AC
  Filled 2016-02-12: qty 3

## 2016-02-12 MED ORDER — LIDOCAINE-EPINEPHRINE (PF) 1 %-1:200000 IJ SOLN
INTRAMUSCULAR | Status: AC
  Filled 2016-02-12: qty 30

## 2016-02-12 MED ORDER — FENTANYL CITRATE (PF) 250 MCG/5ML IJ SOLN
INTRAMUSCULAR | Status: DC | PRN
  Administered 2016-02-12: 100 ug via INTRAVENOUS

## 2016-02-12 MED ORDER — PROPOFOL 10 MG/ML IV BOLUS
INTRAVENOUS | Status: AC
  Filled 2016-02-12: qty 20

## 2016-02-12 MED ORDER — LIDOCAINE HCL (CARDIAC) 20 MG/ML IV SOLN
INTRAVENOUS | Status: DC | PRN
  Administered 2016-02-12: 100 mg via INTRAVENOUS

## 2016-02-12 MED ORDER — NEOSTIGMINE METHYLSULFATE 5 MG/5ML IV SOSY
PREFILLED_SYRINGE | INTRAVENOUS | Status: AC
  Filled 2016-02-12: qty 5

## 2016-02-12 MED ORDER — ROCURONIUM BROMIDE 50 MG/5ML IV SOLN
INTRAVENOUS | Status: AC
  Filled 2016-02-12: qty 2

## 2016-02-12 MED ORDER — ONDANSETRON HCL 4 MG/2ML IJ SOLN
INTRAMUSCULAR | Status: DC | PRN
  Administered 2016-02-12: 4 mg via INTRAVENOUS

## 2016-02-12 MED ORDER — PROPOFOL 10 MG/ML IV BOLUS
INTRAVENOUS | Status: DC | PRN
  Administered 2016-02-12: 150 mg via INTRAVENOUS

## 2016-02-12 MED ORDER — SODIUM CHLORIDE 0.9 % IR SOLN
Status: DC | PRN
  Administered 2016-02-12: 500 mL

## 2016-02-12 MED ORDER — LACTATED RINGERS IV SOLN
INTRAVENOUS | Status: DC
  Administered 2016-02-12 (×2): via INTRAVENOUS

## 2016-02-12 MED ORDER — 0.9 % SODIUM CHLORIDE (POUR BTL) OPTIME
TOPICAL | Status: DC | PRN
  Administered 2016-02-12: 1000 mL

## 2016-02-12 MED ORDER — MIDAZOLAM HCL 2 MG/2ML IJ SOLN
INTRAMUSCULAR | Status: AC
  Filled 2016-02-12: qty 2

## 2016-02-12 MED ORDER — LIDOCAINE-EPINEPHRINE (PF) 1 %-1:200000 IJ SOLN
INTRAMUSCULAR | Status: DC | PRN
  Administered 2016-02-12: 5 mL

## 2016-02-12 MED ORDER — ONDANSETRON HCL 4 MG/2ML IJ SOLN
INTRAMUSCULAR | Status: AC
  Filled 2016-02-12: qty 4

## 2016-02-12 MED ORDER — BUPIVACAINE-EPINEPHRINE (PF) 0.25% -1:200000 IJ SOLN
INTRAMUSCULAR | Status: AC
  Filled 2016-02-12: qty 30

## 2016-02-12 MED ORDER — DEXAMETHASONE SODIUM PHOSPHATE 10 MG/ML IJ SOLN
INTRAMUSCULAR | Status: AC
  Filled 2016-02-12: qty 2

## 2016-02-12 SURGICAL SUPPLY — 62 items
BANDAGE ELASTIC 4 VELCRO ST LF (GAUZE/BANDAGES/DRESSINGS) IMPLANT
BANDAGE ELASTIC 6 VELCRO ST LF (GAUZE/BANDAGES/DRESSINGS) IMPLANT
BENZOIN TINCTURE PRP APPL 2/3 (GAUZE/BANDAGES/DRESSINGS) IMPLANT
BLADE 10 SAFETY STRL DISP (BLADE) IMPLANT
BLADE DERMATOME II (BLADE) IMPLANT
BLADE SURG ROTATE 9660 (MISCELLANEOUS) IMPLANT
CANISTER SUCTION 2500CC (MISCELLANEOUS) ×2 IMPLANT
CANISTER WOUND CARE 500ML ATS (WOUND CARE) IMPLANT
CONT SPEC 4OZ CLIKSEAL STRL BL (MISCELLANEOUS) ×2 IMPLANT
COVER SURGICAL LIGHT HANDLE (MISCELLANEOUS) ×2 IMPLANT
DERMACARRIERS GRAFT 1 TO 1.5 (DISPOSABLE)
DRAPE INCISE IOBAN 66X45 STRL (DRAPES) IMPLANT
DRAPE ORTHO SPLIT 77X108 STRL (DRAPES) ×2
DRAPE PROXIMA HALF (DRAPES) IMPLANT
DRAPE SURG ORHT 6 SPLT 77X108 (DRAPES) ×2 IMPLANT
DRESSING HYDROCOLLOID 4X4 XTH (GAUZE/BANDAGES/DRESSINGS) ×2 IMPLANT
DRSG ADAPTIC 3X8 NADH LF (GAUZE/BANDAGES/DRESSINGS) IMPLANT
DRSG CUTIMED SORBACT 7X9 (GAUZE/BANDAGES/DRESSINGS) ×2 IMPLANT
DRSG OPSITE 6X11 MED (GAUZE/BANDAGES/DRESSINGS) IMPLANT
DRSG PAD ABDOMINAL 8X10 ST (GAUZE/BANDAGES/DRESSINGS) IMPLANT
DRSG TELFA 3X8 NADH (GAUZE/BANDAGES/DRESSINGS) IMPLANT
DRSG VAC ATS LRG SENSATRAC (GAUZE/BANDAGES/DRESSINGS) IMPLANT
DRSG VAC ATS MED SENSATRAC (GAUZE/BANDAGES/DRESSINGS) IMPLANT
DRSG VAC ATS SM SENSATRAC (GAUZE/BANDAGES/DRESSINGS) ×2 IMPLANT
ELECT CAUTERY BLADE 6.4 (BLADE) ×2 IMPLANT
ELECT REM PT RETURN 9FT ADLT (ELECTROSURGICAL) ×2
ELECTRODE REM PT RTRN 9FT ADLT (ELECTROSURGICAL) ×1 IMPLANT
FILTER STRAW FLUID ASPIR (MISCELLANEOUS) ×2 IMPLANT
GAUZE SPONGE 4X4 12PLY STRL (GAUZE/BANDAGES/DRESSINGS) IMPLANT
GAUZE XEROFORM 5X9 LF (GAUZE/BANDAGES/DRESSINGS) IMPLANT
GEL ULTRASOUND 20GR AQUASONIC (MISCELLANEOUS) ×2 IMPLANT
GLOVE BIO SURGEON STRL SZ 6.5 (GLOVE) ×4 IMPLANT
GOWN STRL REUS W/ TWL LRG LVL3 (GOWN DISPOSABLE) ×4 IMPLANT
GOWN STRL REUS W/TWL LRG LVL3 (GOWN DISPOSABLE) ×4
GRAFT DERMACARRIERS 1 TO 1.5 (DISPOSABLE) IMPLANT
HANDPIECE INTERPULSE COAX TIP (DISPOSABLE)
KIT BASIN OR (CUSTOM PROCEDURE TRAY) ×2 IMPLANT
KIT ROOM TURNOVER OR (KITS) ×2 IMPLANT
MATRIX SURGICAL PSM 5X5CM (Tissue) ×2 IMPLANT
MICROMATRIX 500MG (Tissue) ×2 IMPLANT
NEEDLE HYPO 25GX1X1/2 BEV (NEEDLE) ×2 IMPLANT
NEEDLE SPNL 18GX3.5 QUINCKE PK (NEEDLE) IMPLANT
NS IRRIG 1000ML POUR BTL (IV SOLUTION) ×2 IMPLANT
PACK GENERAL/GYN (CUSTOM PROCEDURE TRAY) ×2 IMPLANT
PAD ARMBOARD 7.5X6 YLW CONV (MISCELLANEOUS) ×2 IMPLANT
SET HNDPC FAN SPRY TIP SCT (DISPOSABLE) IMPLANT
SOLUTION PARTIC MCRMTRX 500MG (Tissue) ×1 IMPLANT
STAPLER VISISTAT 35W (STAPLE) IMPLANT
SUT CHROMIC 4 0 PS 2 18 (SUTURE) ×2 IMPLANT
SUT MNCRL AB 3-0 PS2 18 (SUTURE) ×2 IMPLANT
SUT MON AB 5-0 PS2 18 (SUTURE) ×2 IMPLANT
SUT SILK 4 0 PS 2 (SUTURE) IMPLANT
SUT VIC AB 5-0 P-3 18XBRD (SUTURE) ×2 IMPLANT
SUT VIC AB 5-0 P3 18 (SUTURE) ×2
SUT VIC AB 5-0 PS2 18 (SUTURE) IMPLANT
SWAB COLLECTION DEVICE MRSA (MISCELLANEOUS) ×2 IMPLANT
SYR 3ML 25GX5/8 SAFETY (SYRINGE) IMPLANT
SYR CONTROL 10ML LL (SYRINGE) ×2 IMPLANT
TOWEL OR 17X24 6PK STRL BLUE (TOWEL DISPOSABLE) IMPLANT
TOWEL OR 17X26 10 PK STRL BLUE (TOWEL DISPOSABLE) ×2 IMPLANT
TUBE ANAEROBIC SPECIMEN COL (MISCELLANEOUS) ×2 IMPLANT
UNDERPAD 30X30 INCONTINENT (UNDERPADS AND DIAPERS) IMPLANT

## 2016-02-12 NOTE — Discharge Instructions (Signed)
VAC at 100 mmHg pressure

## 2016-02-12 NOTE — Anesthesia Procedure Notes (Signed)
Procedure Name: LMA Insertion Date/Time: 02/12/2016 12:14 PM Performed by: Merdis Delay Pre-anesthesia Checklist: Patient identified, Timeout performed, Emergency Drugs available, Patient being monitored and Suction available Patient Re-evaluated:Patient Re-evaluated prior to inductionOxygen Delivery Method: Circle system utilized Preoxygenation: Pre-oxygenation with 100% oxygen Intubation Type: IV induction LMA: LMA inserted LMA Size: 4.0 Number of attempts: 1 Placement Confirmation: positive ETCO2,  CO2 detector and breath sounds checked- equal and bilateral Tube secured with: Tape Dental Injury: Teeth and Oropharynx as per pre-operative assessment

## 2016-02-12 NOTE — Progress Notes (Signed)
Report given to philip lopez rn as caregiver 

## 2016-02-12 NOTE — Anesthesia Postprocedure Evaluation (Signed)
Anesthesia Post Note  Patient: [name]  Procedure(s) Performed: Procedure(s) (LRB): IRRIGATION AND DEBRIDEMENT RIGHT CHEST WOUND (Right) APPLICATION OF A-CELL TO RIGHT CHEST WOUND (Right) RE-APPLICATION OF WOUND VAC TO RIGHT CHEST WOUND (Right)  Patient location during evaluation: PACU Anesthesia Type: General Level of consciousness: awake and alert Pain management: pain level controlled Vital Signs Assessment: post-procedure vital signs reviewed and stable Respiratory status: spontaneous breathing, nonlabored ventilation, respiratory function stable and patient connected to nasal cannula oxygen Cardiovascular status: blood pressure returned to baseline and stable Postop Assessment: no signs of nausea or vomiting Anesthetic complications: no    Last Vitals:  Filed Vitals:   02/12/16 1345 02/12/16 1400  BP:    Pulse: 59   Temp:  37.1 C  Resp: 18     Last Pain:  Filed Vitals:   02/12/16 1406  PainSc: 0-No pain                 Leoncio Hansen DAVID

## 2016-02-12 NOTE — Interval H&P Note (Signed)
History and Physical Interval Note:  02/12/2016 7:21 AM  [name]  has presented today for surgery, with the diagnosis of RIGHT CHEST WOUND  The various methods of treatment have been discussed with the patient and family. After consideration of risks, benefits and other options for treatment, the patient has consented to  Procedure(s): POSSIBLE SKIN GRAFT SPLIT THICKNESS WITH ACELL AND VAC (N/A) as a surgical intervention .  The patient's history has been reviewed, patient examined, no change in status, stable for surgery.  I have reviewed the patient's chart and labs.  Questions were answered to the patient's satisfaction.     Wallace Going

## 2016-02-12 NOTE — Op Note (Signed)
Operative Note   DATE OF OPERATION: 02/12/2016  LOCATION:  [facility]  SURGICAL DIVISION: Plastic Surgery  PREOPERATIVE DIAGNOSES:  Chest Wall Defect  5 x 6 cm  POSTOPERATIVE DIAGNOSES:  same  PROCEDURE:   1. Preparation of chest wall defect for placement of Acell (5 x 5 cm sheet and 500 mg powder) and VAC. 2. Partial closure of 2 cm wound.  SURGEON: Finn Altemose Sanger Charlise Giovanetti, DO  ASSISTANT: Shawn Rayburn, PA  ANESTHESIA:  General.   COMPLICATIONS: None.   INDICATIONS FOR PROCEDURE:  The patient, [name] is a 81 y.o. female born on 12-28-1934, is here for treatment of a chest wall defect.  She underwent a valve replacement and CABG.  She had wound breakdown at the sternal incision site that healed and then had inferior and right sided chest / cartilage breakdown.  This is likely due to the radiation she received for treatment of her right breast cancer. She underwent a latissimus muscle flap and had superficial loss.  She had debridement and Acell placed several weeks ago and returns for further care. MRN: [id number]  CONSENT:  Informed consent was obtained directly from the patient. Risks, benefits and alternatives were fully discussed. Specific risks including but not limited to bleeding, infection, hematoma, seroma, scarring, pain, infection, contracture, asymmetry, wound healing problems, and need for further surgery were all discussed. The patient did have an ample opportunity to have questions answered to satisfaction.   DESCRIPTION OF PROCEDURE:  The patient was taken to the operating room. SCDs were placed and IV antibiotics were given. The patient's operative site was prepped and draped in a sterile fashion. A time out was performed and all information was confirmed to be correct.  General anesthesia was administered.  The curette was used to freshen the base of the wound.  There was no fat necrosis.  There was a small amount of yellow drainage from the most  superior portion of the wound.  This was cultured for aerobic and anaerobic.  The area was irrigated with antibiotic solution.  Local was placed in the superior flap of the lateral aspect for intraoperative hemostasis and postoperative pain control.  Undermining was done for 1 cm superiorly and 5 mm inferiorly.  The skin was then closed 2 cm laterally with 3-0 Monocryl vertical mattress sutures. The Acell powder (500 mg) and sheet (5 x 5 cm) were applied and secured with 5-0 Vicryl.  The wound was reduced to 4.5 x 5 cm.  There is improved granulation but not ready for a skin graft. The sorbac was applied and secured with a 5-0 Vicryl.  Restore was placed around the wound to protect the skin.  The VAC was placed.  There was an excellent seal.  The patient tolerated the procedure well.  There were no complications. The patient was allowed to wake from anesthesia, extubated and taken to the recovery room in satisfactory condition.

## 2016-02-12 NOTE — Transfer of Care (Signed)
Immediate Anesthesia Transfer of Care Note  Patient: CHRSTINA MOENCH  Procedure(s) Performed: Procedure(s): IRRIGATION AND DEBRIDEMENT RIGHT CHEST WOUND (Right) APPLICATION OF A-CELL TO RIGHT CHEST WOUND (Right) RE-APPLICATION OF WOUND VAC TO RIGHT CHEST WOUND (Right)  Patient Location: PACU  Anesthesia Type:General  Level of Consciousness: awake, alert  and oriented  Airway & Oxygen Therapy: Patient Spontanous Breathing and Patient connected to nasal cannula oxygen  Post-op Assessment: Report given to RN and Post -op Vital signs reviewed and stable  Post vital signs: Reviewed and stable  Last Vitals:  Filed Vitals:   02/12/16 1007  BP: 156/64  Pulse: 86  Temp: 36.6 C  Resp: 18    Last Pain: There were no vitals filed for this visit.       Complications: No apparent anesthesia complications   Coclear implant replaced in ear by patient

## 2016-02-12 NOTE — Brief Op Note (Signed)
02/12/2016  12:01 PM  PATIENT:  [name]  81 y.o. female  PRE-OPERATIVE DIAGNOSIS:  RIGHT CHEST WOUND  POST-OPERATIVE DIAGNOSIS:  RIGHT CHEST WOUND  PROCEDURE:  Procedure(s): POSSIBLE SKIN GRAFT SPLIT THICKNESS WITH ACELL AND VAC (N/A)  SURGEON:  Surgeon(s) and Role:    * Babatunde Seago S Burdett Pinzon, DO - Primary  PHYSICIAN ASSISTANT: Shawn Rayburn, PA  ASSISTANTS: none   ANESTHESIA:   general  EBL:     BLOOD ADMINISTERED:none  DRAINS: none   LOCAL MEDICATIONS USED:  Lidocaine with epi  SPECIMEN:  Micro  DISPOSITION OF SPECIMEN:  N/A  COUNTS:  YES  TOURNIQUET:  * No tourniquets in log *  DICTATION: .Dragon Dictation  PLAN OF CARE: Discharge to home after PACU  PATIENT DISPOSITION:  PACU - hemodynamically stable.   Delay start of Pharmacological VTE agent (>24hrs) due to surgical blood loss or risk of bleeding: no

## 2016-02-12 NOTE — Anesthesia Preprocedure Evaluation (Addendum)
Anesthesia Evaluation  Patient identified by MRN, date of birth, ID band Patient awake    History of Anesthesia Complications (+) PONV  Airway Mallampati: II  TM Distance: >3 FB Neck ROM: Full    Dental  (+) Teeth Intact, Dental Advisory Given   Pulmonary COPD, former smoker,    Pulmonary exam normal        Cardiovascular hypertension, Pt. on medications + CAD and +CHF  Normal cardiovascular exam+ dysrhythmias      Neuro/Psych Anxiety    GI/Hepatic GERD  Medicated and Controlled,  Endo/Other  diabetes, Oral Hypoglycemic Agents  Renal/GU      Musculoskeletal   Abdominal   Peds  Hematology   Anesthesia Other Findings   Reproductive/Obstetrics                            Anesthesia Physical Anesthesia Plan  ASA: II  Anesthesia Plan: General   Post-op Pain Management:    Induction:   Airway Management Planned: LMA  Additional Equipment:   Intra-op Plan:   Post-operative Plan:   Informed Consent: I have reviewed the patients History and Physical, chart, labs and discussed the procedure including the risks, benefits and alternatives for the proposed anesthesia with the patient or authorized representative who has indicated his/her understanding and acceptance.   Dental advisory given  Plan Discussed with: CRNA and Surgeon  Anesthesia Plan Comments:        Anesthesia Quick Evaluation

## 2016-02-12 NOTE — H&P (View-Only) (Signed)
[name] is an 81 y.o. female.   Chief Complaint:r ight chest wall wound HPI: [name] is a 81 yo female seen for post operative follow up following repair to her right chest wall defect with a latissimus muscle flap on 11/18/15.  History:  She developed an open wound of her right chest wall following CABG x 1 and MVR in August of 2016. The sternal incision had ongoing difficulty healing and eventually developed an open wound with drainage. She had remotely undergone treatment to the right chest area for right breast cancer and it was felt that this may be way she was having difficulty healing post operatively.  She underwent excisional debridement by CT surgery and we consulted in the hospital for coverage of the residual wound/defect. The latissimus flap was done but there was some superficial loss of the skin over the muscle post operatively and the patient was taken back to the OR x 3 for debridement of the skin/superficial tissue loss over the muscle flap with placement of Acell and VAC dressing. She is seen for post operative follow up today. She continues on Cipro po at home and is anticoagulated on Coumadin.   Past Medical History  Diagnosis Date  . Hypertension   . COPD (chronic obstructive pulmonary disease) ([place])   . Hyperlipidemia   . Meniere disease   . PAF (paroxysmal atrial fibrillation) ([place])   . COPD (chronic obstructive pulmonary disease) ([place])   . Hypothyroidism   . Pulmonary hypertension ([place])   . Chronic diastolic CHF (congestive heart failure) ([employer])   . PONV (postoperative nausea and vomiting)   . Anxiety   . GERD (gastroesophageal reflux disease)   . Severe Mitral Regurgitation s/p MV Repair     a. 04/2015 s/p 27 mm [facility] Mitral bovine bioprosthetic tissue valve  . Tricuspid Regurgitation s/p Repair     a. 04/2015 s/p 26 mm Edwards mc3 ring annuloplasty  . 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 04/2015 Complete bilateral atrial lesion set using  cryothermy and bipolar radiofrequency ablation with clipping of LA appendage  . CAD s/p CABG x 1     a. 04/2015 LIMA to LAD  . Post-surgical complete heart block, symptomatic     a. 04/2015 s/p MDT [id number] Advisa DR MRI DC PPM.  . Wound infection ([place]) 10/13/2015    Superficial sternal wound infection  . Cancer of right breast ([place]) 08/09/2001  . Presence of permanent cardiac pacemaker   . On home oxygen therapy     "2L at night" (11/10/2015)  . Pneumonia ~ 2010  . Arthritis     "hands" (11/10/2015)  . Surgical wound, non healing - chest wall 11/10/2015    Past Surgical History  Procedure Laterality Date  . Cochlear implant Left 2005?  Marland Kitchen Cardiac catheterization  11/2013    [facility]  . Cardiac catheterization  10/2014    [facility]  . Mitral valve repair N/A 04/25/2015    Procedure: MITRAL VALVE  REPLACEMENT using a 27 mm Edwards Perimount Magna Mitral Ease Valve;  Surgeon: Rexene Alberts, MD;  Location: [place];  Service: Open Heart Surgery;  Laterality: N/A;  . Tricuspid valve replacement N/A 04/25/2015    Procedure: TRICUSPID VALVE REPAIR;  Surgeon: Rexene Alberts, MD;  Location: [place];  Service: Open Heart Surgery;  Laterality: N/A;  . Maze N/A 04/25/2015    Procedure: MAZE;  Surgeon: Rexene Alberts, MD;  Location: [place];  Service:  Open Heart Surgery;  Laterality: N/A;  . Tee without cardioversion N/A 04/25/2015    Procedure: TRANSESOPHAGEAL ECHOCARDIOGRAM (TEE);  Surgeon: Rexene Alberts, MD;  Location: [place];  Service: Open Heart Surgery;  Laterality: N/A;  . Clipping of atrial appendage N/A 04/25/2015    Procedure: CLIPPING OF ATRIAL APPENDAGE;  Surgeon: Rexene Alberts, MD;  Location: [place];  Service: Open Heart Surgery;  Laterality: N/A;  . Ep implantable device N/A 05/02/2015    Procedure: Pacemaker Implant;  Surgeon: Thompson Grayer, MD;  Location: [place] [facility];  Service: Cardiovascular;  Laterality: N/A;  . Sternal wound debridement N/A 05/09/2015    Procedure: STERNAL WOUND DEBRIDEMENT;   Surgeon: Rexene Alberts, MD;  Location: [place];  Service: Thoracic;  Laterality: N/A;  . Application of wound vac N/A 05/09/2015    Procedure: APPLICATION OF WOUND VAC;  Surgeon: Rexene Alberts, MD;  Location: [place];  Service: Thoracic;  Laterality: N/A;  . Sternal wires removal N/A 06/05/2015    Procedure: STERNAL WIRES REMOVAL;  Surgeon: Rexene Alberts, MD;  Location: [place];  Service: Thoracic;  Laterality: N/A;  . Tonsillectomy    . Breast biopsy Left 11/25/06    neg  . Breast biopsy Left 01/20/12    /clip-neg  . Mastectomy, partial Right 2002  . Vaginal hysterectomy    . Dilation and curettage of uterus  "several before hysterectomy"  . Cataract extraction w/ intraocular lens  implant, bilateral Bilateral 2014  . Cardiac valve replacement    . Insert / replace / remove pacemaker    . Coronary artery bypass graft N/A 04/25/2015    Procedure: CORONARY ARTERY BYPASS GRAFTING (CABG) x ONE, using left internal mammary artery;  Surgeon: Rexene Alberts, MD;  Location: [place];  Service: Open Heart Surgery;  Laterality: N/A;  . Sternal wound debridement N/A 11/13/2015    Procedure: Excisional drainage of RIGHT Chest wall mass and breast mass ;  Surgeon: Rexene Alberts, MD;  Location: [place];  Service: Thoracic;  Laterality: N/A;  . Application of wound vac N/A 11/13/2015    Procedure: APPLICATION OF WOUND VAC;  Surgeon: Rexene Alberts, MD;  Location: [place];  Service: Thoracic;  Laterality: N/A;  . Incision and drainage of wound N/A 11/21/2015    Procedure: IRRIGATION AND DEBRIDEMENT WOUND;  Surgeon: Loel Lofty Mal Asher, DO;  Location: [place];  Service: Plastics;  Laterality: N/A;  . Application of wound vac N/A 11/21/2015    Procedure: APPLICATION OF WOUND VAC;  Surgeon: Loel Lofty Japleen Tornow, DO;  Location: [place];  Service: Plastics;  Laterality: N/A;  . Application of a-cell of chest/abdomen N/A 11/21/2015    Procedure: APPLICATION OF A-CELL OF CHEST/ABDOMEN;  Surgeon: Loel Lofty Thayden Lemire, DO;  Location:  [place];  Service: Plastics;  Laterality: N/A;  . Tram Right 11/18/2015    Procedure: VRAM Vertical Rectus Abdominus Muscle Flap;  Surgeon: Loel Lofty Meygan Kyser, DO;  Location: [place];  Service: Plastics;  Laterality: Right;  RIght Back  . I&d extremity Right 12/06/2015    Procedure: IRRIGATION AND DEBRIDEMENT RIGHT CHEST WALL WITH ACELL PLACEMENT AND VAC;  Surgeon: Loel Lofty Brandelyn Henne, DO;  Location: [place];  Service: Plastics;  Laterality: Right;  . Incision and drainage of wound Right 12/21/2015    Procedure: IRRIGATION AND DEBRIDEMENT right chest wall WOUND;  Surgeon: Loel Lofty Tahj Njoku, DO;  Location: [place];  Service: Plastics;  Laterality: Right;  right chest wall   . Application of  wound vac Right 12/21/2015    Procedure: APPLICATION OF WOUND VAC to right chest wall ;  Surgeon: Loel Lofty Joe Gee, DO;  Location: [place];  Service: Plastics;  Laterality: Right;  . Application of a-cell of chest/abdomen Right 12/21/2015    Procedure: APPLICATION OF A-CELL OF RIGHT CHEST;  Surgeon: Loel Lofty Tiffony Kite, DO;  Location: [place];  Service: Plastics;  Laterality: Right;  . Irrigation and debridement of wound with split thickness skin graft Right 01/11/2016    Procedure: IRRIGATION AND DEBRIDEMENT OF RIGHT CHEST WOUND ;  Surgeon: Loel Lofty Marqueta Pulley, DO;  Location: [place];  Service: Plastics;  Laterality: Right;  . Application of a-cell of chest/abdomen Right 01/11/2016    Procedure: APPLICATION OF A-CELL OF RIGHT CHEST;  Surgeon: Loel Lofty Mana Morison, DO;  Location: [place];  Service: Plastics;  Laterality: Right;  . Application of wound vac Right 01/11/2016    Procedure: APPLICATION OF WOUND VAC RIGHT CHEST ;  Surgeon: Loel Lofty Kedric Bumgarner, DO;  Location: [place];  Service: Plastics;  Laterality: Right;    Family History  Problem Relation Age of Onset  . Heart disease Father   . Heart disease Brother   . Heart attack Paternal Uncle    Social History:  reports that she quit smoking about 17 years ago. Her smoking use  included Cigarettes. She has a 60 pack-year smoking history. She has never used smokeless tobacco. She reports that she drinks alcohol. She reports that she does not use illicit drugs.  Allergies:  Allergies  Allergen Reactions  . Ace Inhibitors Cough  . Augmentin [Amoxicillin-Pot Clavulanate] Swelling    JOINTS, PAIN  . Penicillins Swelling    Swollen joints, Has patient had a PCN reaction causing immediate rash, facial/tongue/throat swelling, SOB or lightheadedness with hypotension: Yes Has patient had a PCN reaction causing severe rash involving mucus membranes or skin necrosis: No Has patient had a PCN reaction that required hospitalization No Has patient had a PCN reaction occurring within the last 10 years: No If all of the above answers are "NO", then may proceed with Cephalosporin use.   . Nifedipine Other (See Comments)    UNSPECIFIED  . Propranolol Other (See Comments)    UNSPECIFIED  . Diazepam Other (See Comments)    Made her "hyper"  . Meperidine Other (See Comments)    Made her "hyper"  . Propoxyphene Other (See Comments)    Made her "hyper"    No prescriptions prior to admission    No results found for this or any previous visit (from the past 48 hour(s)). No results found.  Review of Systems  Constitutional: Negative.   HENT: Negative.   Eyes: Negative.   Respiratory: Negative.   Cardiovascular: Negative.   Gastrointestinal: Negative.   Genitourinary: Negative.   Musculoskeletal: Positive for joint pain.  Skin: Negative.   Neurological: Negative.   Psychiatric/Behavioral: Negative.     There were no vitals taken for this visit. Physical Exam  Constitutional: She is oriented to person, place, and time. She appears well-developed and well-nourished.  HENT:  Head: Normocephalic and atraumatic.  Eyes: Conjunctivae and EOM are normal. Pupils are equal, round, and reactive to light.  Cardiovascular: Normal rate.   GI: Soft.  Neurological: She is alert  and oriented to person, place, and time.  Psychiatric: She has a normal mood and affect. Her behavior is normal. Judgment and thought content normal.     Assessment/Plan Debridement of right knee with acell and VAC placement.  [name]  [place], DO 01/24/2016, 8:16 AM

## 2016-02-14 ENCOUNTER — Encounter (HOSPITAL_COMMUNITY): Payer: Self-pay | Admitting: Plastic Surgery

## 2016-02-14 ENCOUNTER — Encounter: Payer: Self-pay | Admitting: Cardiology

## 2016-02-14 NOTE — Telephone Encounter (Signed)
[name]

## 2016-02-15 ENCOUNTER — Other Ambulatory Visit: Payer: Self-pay | Admitting: Cardiovascular Disease

## 2016-02-15 NOTE — Telephone Encounter (Signed)
Please review for refill. Thanks!  

## 2016-02-19 LAB — AEROBIC/ANAEROBIC CULTURE (SURGICAL/DEEP WOUND): CULTURE: NO GROWTH

## 2016-02-21 ENCOUNTER — Ambulatory Visit (INDEPENDENT_AMBULATORY_CARE_PROVIDER_SITE_OTHER): Payer: Medicare Other | Admitting: Cardiology

## 2016-02-21 ENCOUNTER — Encounter: Payer: Self-pay | Admitting: *Deleted

## 2016-02-21 DIAGNOSIS — I4891 Unspecified atrial fibrillation: Secondary | ICD-10-CM

## 2016-02-21 DIAGNOSIS — Z952 Presence of prosthetic heart valve: Secondary | ICD-10-CM

## 2016-02-21 DIAGNOSIS — I340 Nonrheumatic mitral (valve) insufficiency: Secondary | ICD-10-CM

## 2016-02-21 DIAGNOSIS — Z951 Presence of aortocoronary bypass graft: Secondary | ICD-10-CM

## 2016-02-21 DIAGNOSIS — Z953 Presence of xenogenic heart valve: Secondary | ICD-10-CM

## 2016-02-21 LAB — POCT INR: INR: 1.9

## 2016-02-27 ENCOUNTER — Telehealth: Payer: Self-pay | Admitting: *Deleted

## 2016-02-27 NOTE — Telephone Encounter (Signed)
Called to check on status to return to program. No answer

## 2016-02-28 ENCOUNTER — Encounter: Payer: Self-pay | Admitting: *Deleted

## 2016-02-28 DIAGNOSIS — Z951 Presence of aortocoronary bypass graft: Secondary | ICD-10-CM

## 2016-02-28 DIAGNOSIS — Z952 Presence of prosthetic heart valve: Secondary | ICD-10-CM

## 2016-02-28 NOTE — Progress Notes (Signed)
Cardiac Individual Treatment Plan  Patient Details  Name: [name] MRN: [id number] Date of Birth: 01-26-1935 Referring Provider:    Initial Encounter Date:       Cardiac Rehab from 07/03/2015 in [facility] Cardiac and Pulmonary Rehab   Date  07/03/15      Visit Diagnosis: S/P MVR (mitral valve replacement)  S/P CABG x 1  Patient's [name] on Admission:  Current outpatient prescriptions:  .  acetaminophen (TYLENOL) 325 MG tablet, Take 2 tablets (650 mg total) by mouth every 4 (four) hours as needed for mild pain (or Fever >/= 101)., Disp: , Rfl:  .  aspirin EC 81 MG EC tablet, Take 1 tablet (81 mg total) by mouth daily., Disp: , Rfl:  .  bisoprolol (ZEBETA) 10 MG tablet, Take 1 tablet (10 mg total) by mouth daily., Disp: 30 tablet, Rfl: 5 .  fluticasone (FLONASE) 50 MCG/ACT nasal spray, Place 1 spray into both nostrils daily as needed for allergies. , Disp: , Rfl:  .  furosemide (LASIX) 20 MG tablet, Take 20 mg by mouth 2 (two) times daily. , Disp: , Rfl:  .  gabapentin (NEURONTIN) 100 MG capsule, Take 100 mg by mouth at bedtime. , Disp: , Rfl: 1 .  ipratropium-albuterol (DUONEB) 0.5-2.5 (3) MG/3ML SOLN, Take 3 mLs by nebulization every 6 (six) hours as needed (for shortness of breath)., Disp: 360 mL, Rfl: 10 .  levothyroxine (SYNTHROID, LEVOTHROID) 88 MCG tablet, Take 88 mcg by mouth daily before breakfast., Disp: , Rfl:  .  losartan (COZAAR) 100 MG tablet, Take 1 tablet (100 mg total) by mouth daily., Disp: 30 tablet, Rfl: 3 .  pantoprazole (PROTONIX) 40 MG tablet, Take 40 mg by mouth 2 (two) times daily. , Disp: , Rfl:  .  potassium chloride SA (K-DUR,KLOR-CON) 20 MEQ tablet, Take 20 mEq by mouth daily., Disp: , Rfl: 2 .  simvastatin (ZOCOR) 20 MG tablet, Take 20 mg by mouth every evening. , Disp: , Rfl:  .  spironolactone (ALDACTONE) 25 MG tablet, Take 1 tablet (25 mg total) by mouth daily., Disp: 30 tablet, Rfl: 6 .  traMADol (ULTRAM) 50 MG tablet, Take 1 tablet (50 mg  total) by mouth every 6 (six) hours as needed for moderate pain or severe pain., Disp: 30 tablet, Rfl: 0 .  warfarin (COUMADIN) 2.5 MG tablet, TAKE 1 TABLET BY MOUTH AS DIRECTED, Disp: 40 tablet, Rfl: 2  Past Medical History: Past Medical History  Diagnosis Date  . Hypertension   . COPD (chronic obstructive pulmonary disease) ([place])   . Hyperlipidemia   . Meniere disease   . PAF (paroxysmal atrial fibrillation) ([place])   . COPD (chronic obstructive pulmonary disease) ([place])   . Hypothyroidism   . Pulmonary hypertension ([place])   . Chronic diastolic CHF (congestive heart failure) ([place])   . PONV (postoperative nausea and vomiting)   . Anxiety   . GERD (gastroesophageal reflux disease)   . Severe Mitral Regurgitation s/p MV Repair     a. 04/2015 s/p 27 mm [facility] Mitral bovine bioprosthetic tissue valve  . Tricuspid Regurgitation s/p Repair     a. 04/2015 s/p 26 mm Edwards mc3 ring annuloplasty  . 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 04/2015 Complete bilateral atrial lesion set using cryothermy and bipolar radiofrequency ablation with clipping of LA appendage  . CAD s/p CABG x 1     a. 04/2015 LIMA to LAD  . Post-surgical complete heart block, symptomatic  a. 04/2015 s/p MDT [id number] Advisa DR MRI DC PPM.  . Wound infection ([place]) 10/13/2015    Superficial sternal wound infection  . Cancer of right breast ([place]) 08/09/2001  . Presence of permanent cardiac pacemaker   . On [name] oxygen therapy     "2L at night" (11/10/2015)  . Pneumonia ~ 2010  . Arthritis     "hands" (11/10/2015)  . Surgical wound, non healing - chest wall 11/10/2015    Tobacco Use: History  Smoking status  . Former Smoker -- 1.50 packs/day for 40 years  . Types: Cigarettes  . Quit date: 04/20/1998  Smokeless tobacco  . Never Used    Labs: Recent Review Flowsheet Data    Labs for ITP Cardiac and Pulmonary Rehab Latest Ref Rng 04/28/2015 04/29/2015 04/30/2015 11/13/2015 11/14/2015   PHART 7.350 - 7.450 - - - 7.428  7.420   PCO2ART 35.0 - 45.0 mmHg - - - 41.1 49.4(H)   HCO3 20.0 - 24.0 mEq/L - - - 26.7(H) 31.5(H)   TCO2 0 - 100 mmol/L - 34 34 27.9 33.0   O2SAT - 65.3 61.7 - 95.2 98.4       Exercise Target Goals:    Exercise Program Goal: Individual exercise prescription set with THRR, safety & activity barriers. Participant demonstrates ability to understand and report RPE using BORG scale, to self-measure pulse accurately, and to acknowledge the importance of the exercise prescription.  Exercise Prescription Goal: Starting with aerobic activity 30 plus minutes a day, 3 days per week for initial exercise prescription. Provide [name] exercise prescription and guidelines that participant acknowledges understanding prior to discharge.  Activity Barriers & Risk Stratification:   6 Minute Walk:   Initial Exercise Prescription:   Perform Capillary Blood Glucose checks as needed.  Exercise Prescription Changes:     Exercise Prescription Changes      09/26/15 0800 10/23/15 1100 11/20/15 1200       Exercise Review   Progression No  Absent since last review; last visit 08/09/15 No  Absent since last review; last visit 08/09/15 No  Absent since last review; last visit 08/09/15     Response to Exercise   Comments Ex. Rx. will be re evaluated upon return due to extended absence Ex. Rx. will be re evaluated upon return due to extended absence Ex. Rx. will be re evaluated upon return due to extended absence     Duration Progress to 50 minutes of aerobic without signs/symptoms of physical distress Progress to 50 minutes of aerobic without signs/symptoms of physical distress Progress to 50 minutes of aerobic without signs/symptoms of physical distress     Intensity Rest + 30 Rest + 30 Rest + 30     Progression   Progression Continue progressive overload as per policy without signs/symptoms or physical distress. Continue progressive overload as per policy without signs/symptoms or physical distress.  Continue progressive overload as per policy without signs/symptoms or physical distress.     Resistance Training   Training Prescription (read-only) Yes Yes      Weight (read-only) 2 2      Reps (read-only) 10-15 10-15      Treadmill   MPH (read-only) 2 2      Grade (read-only) 0 0      Minutes (read-only) 20 20      REL-XR   Level (read-only) 5 5      Watts (read-only) 45 45      Minutes (read-only) 15 15  Exercise Comments:     Exercise Comments      12/27/15 0654 01/24/16 0659 02/21/16 1504       Exercise Comments The patient has not attended class since the last review. There has been no progress made due to lack of attendance. Workloads may need to be adjusted if the patient returns to class.  The patient has not attended class since the last review. There has been no progress made due to lack of attendance. Workloads may need to be adjusted if the patient returns to class.  Pt has not returned to rehab since last medical review.        Discharge Exercise Prescription (Final Exercise Prescription Changes):     Exercise Prescription Changes - 11/20/15 1200    Exercise Review   Progression No  Absent since last review; last visit 08/09/15   Response to Exercise   Comments Ex. Rx. will be re evaluated upon return due to extended absence   Duration Progress to 50 minutes of aerobic without signs/symptoms of physical distress   Intensity Rest + 30   Progression   Progression Continue progressive overload as per policy without signs/symptoms or physical distress.      Nutrition:  Target Goals: Understanding of nutrition guidelines, daily intake of sodium 1500mg , cholesterol 200mg , calories 30% from fat and 7% or less from saturated fats, daily to have 5 or more servings of fruits and vegetables.  Biometrics:    Nutrition Therapy Plan and Nutrition Goals:   Nutrition Discharge: Rate Your Plate Scores:   Nutrition Goals Re-Evaluation:     Nutrition  Goals Re-Evaluation      11/27/15 1359           Personal Goal #1 Re-Evaluation   Personal Goal #1 [name] has been out since 08/09/2015 due to wound debridement. Noted that [name] recently had a Irrigation and debridment of her chest wall per another dept EpIC charting.        Goal Progress Seen No          Psychosocial: Target Goals: Acknowledge presence or absence of depression, maximize coping skills, provide positive support system. Participant is able to verbalize types and ability to use techniques and skills needed for reducing stress and depression.  Initial Review & Psychosocial Screening:   Quality of Life Scores:   PHQ-9:     Recent Review Flowsheet Data    Depression screen [facility] 2/9 07/03/2015   Decreased Interest 0   Down, Depressed, Hopeless 0   PHQ - 2 Score 0   Altered sleeping 0   Tired, decreased energy 3   Change in appetite 0   Feeling bad or failure about yourself  0   Trouble concentrating 0   Moving slowly or fidgety/restless 0   Suicidal thoughts 0   PHQ-9 Score 3   Difficult doing work/chores Somewhat difficult      Psychosocial Evaluation and Intervention:   Psychosocial Re-Evaluation:     Psychosocial Re-Evaluation      11/13/15 1422 11/27/15 1425         Psychosocial Re-Evaluation   Comments I left vm for [name] since she called on 11/06/2015 to see if she could return. I read EPIC note from today that mentioned an Infection and Wound debridement so I suggested she return after things get better for her. I called [name] and her husband answered. He said she just got out of [facility] for another debridment of her chest wall.  Vocational Rehabilitation: Provide vocational rehab assistance to qualifying candidates.   Vocational Rehab Evaluation & Intervention:   Education: Education Goals: Education classes will be provided on a weekly basis, covering required topics. Participant will state understanding/return  demonstration of topics presented.  Learning Barriers/Preferences:   Education Topics: General Nutrition Guidelines/Fats and Fiber: -Group instruction provided by verbal, written material, models and posters to present the general guidelines for heart healthy nutrition. Gives an explanation and review of dietary fats and fiber.   Controlling Sodium/Reading Food Labels: -Group verbal and written material supporting the discussion of sodium use in heart healthy nutrition. Review and explanation with models, verbal and written materials for utilization of the food label.   Exercise Physiology & Risk Factors: - Group verbal and written instruction with models to review the exercise physiology of the cardiovascular system and associated critical values. Details cardiovascular disease risk factors and the goals associated with each risk factor.          Cardiac Rehab from 08/09/2015 in [facility] Cardiac and Pulmonary Rehab   Date  07/12/15   Educator  RM   Instruction Review Code  2- meets goals/outcomes      Aerobic Exercise & Resistance Training: - Gives group verbal and written discussion on the health impact of inactivity. On the components of aerobic and resistive training programs and the benefits of this training and how to safely progress through these programs.      Cardiac Rehab from 08/09/2015 in [facility] Cardiac and Pulmonary Rehab   Date  07/17/15   Educator  RM   Instruction Review Code  2- meets goals/outcomes      Flexibility, Balance, General Exercise Guidelines: - Provides group verbal and written instruction on the benefits of flexibility and balance training programs. Provides general exercise guidelines with specific guidelines to those with heart or lung disease. Demonstration and skill practice provided.      Cardiac Rehab from 08/09/2015 in [facility] Cardiac and Pulmonary Rehab   Date  07/24/15   Educator  RM   Instruction Review Code  2- meets goals/outcomes       Stress Management: - Provides group verbal and written instruction about the health risks of elevated stress, cause of high stress, and healthy ways to reduce stress.      Cardiac Rehab from 08/09/2015 in [facility] Cardiac and Pulmonary Rehab   Date  07/26/15   Educator  [facility]   Instruction Review Code  2- meets goals/outcomes      Depression: - Provides group verbal and written instruction on the correlation between heart/lung disease and depressed mood, treatment options, and the stigmas associated with seeking treatment.   Anatomy & Physiology of the Heart: - Group verbal and written instruction and models provide basic cardiac anatomy and physiology, with the coronary electrical and arterial systems. Review of: AMI, Angina, Valve disease, Heart Failure, Cardiac Arrhythmia, Pacemakers, and the ICD.   Cardiac Procedures: - Group verbal and written instruction and models to describe the testing methods done to diagnose heart disease. Reviews the outcomes of the test results. Describes the treatment choices: Medical Management, Angioplasty, or Coronary Bypass Surgery.   Cardiac Medications: - Group verbal and written instruction to review commonly prescribed medications for heart disease. Reviews the medication, class of the drug, and side effects. Includes the steps to properly store meds and maintain the prescription regimen.   Go Sex-Intimacy & Heart Disease, Get SMART - Goal Setting: - Group verbal and written instruction through game format to discuss  heart disease and the return to sexual intimacy. Provides group verbal and written material to discuss and apply goal setting through the application of the S.M.A.R.T. Method.   Other Matters of the Heart: - Provides group verbal, written materials and models to describe Heart Failure, Angina, Valve Disease, and Diabetes in the realm of heart disease. Includes description of the disease process and treatment options available to the  cardiac patient.      Cardiac Rehab from 08/09/2015 in [facility] Cardiac and Pulmonary Rehab   Date  08/09/15   Educator  SB   Instruction Review Code  2- meets goals/outcomes      Exercise & Equipment Safety: - Individual verbal instruction and demonstration of equipment use and safety with use of the equipment.      Cardiac Rehab from 08/09/2015 in [facility] Cardiac and Pulmonary Rehab   Date  07/03/15   Educator  SB   Instruction Review Code  2- meets goals/outcomes      Infection Prevention: - Provides verbal and written material to individual with discussion of infection control including proper hand washing and proper equipment cleaning during exercise session.      Cardiac Rehab from 08/09/2015 in [facility] Cardiac and Pulmonary Rehab   Date  07/03/15   Educator  Sb   Instruction Review Code  2- meets goals/outcomes      Falls Prevention: - Provides verbal and written material to individual with discussion of falls prevention and safety.      Cardiac Rehab from 08/09/2015 in [facility] Cardiac and Pulmonary Rehab   Date  07/03/15   Educator  Sb   Instruction Review Code  2- meets goals/outcomes      Diabetes: - Individual verbal and written instruction to review signs/symptoms of diabetes, desired ranges of glucose level fasting, after meals and with exercise. Advice that pre and post exercise glucose checks will be done for 3 sessions at entry of program.    Knowledge Questionnaire Score:   Core Components/Risk Factors/Patient Goals at Admission:   Core Components/Risk Factors/Patient Goals Review:      Goals and Risk Factor Review      09/26/15 0826 10/23/15 1108 11/13/15 1422 11/27/15 1401 11/27/15 1424   Core Components/Risk Factors/Patient Goals Review   Personal Goals Review    Sedentary Sedentary   Review   I left vm for [name] since she called on 11/06/2015 to see if she could return. I read EPIC note from today that mentioned an Infection and Wound debridement so I  suggested she return after things get better for her. [name] has been out since 08/09/2015 due to wound debridement. Noted that [name] recently had a Irrigation and debridment of her chest wall per another dept EpIC charting.  I called [name] and her husband answered. He said she just got out of [facility] for another debridment of her chest wall.    Expected Outcomes    Clear of infection to be able to exercise.     Increase Aerobic Exercise and Physical Activity (read-only)   Goals Progress/Improvement seen  No No      Comments Ex. Rx. will be re-evaluated upon return due to extende absence Ex. Rx. will be re-evaluated upon return due to extende absence         Core Components/Risk Factors/Patient Goals at Discharge (Final Review):      Goals and Risk Factor Review - 11/27/15 1424    Core Components/Risk Factors/Patient Goals Review   Personal Goals Review  Sedentary   Review I called [name] [name] and her husband answered. He said she just got out of [facility] for another debridment of her chest wall.       ITP Comments:     ITP Comments      09/10/15 1215 10/05/15 1131 10/31/15 0751 11/13/15 1422 11/30/15 1244   ITP Comments Ready for 30 day review.  Continue with ITP  Remains out with medical concerns.  Has appoinment mid JAn with cardiologist regarding pacemaker qustions. Ready for 30 day review. Continue with ITP. Remains out with medical reasons 30 day review.  Continue with ITP. Continues to remain out for medical reasons I left vm for [name] since she called on 11/06/2015 to see if she could return. I read EPIC note from today that mentioned an Infection and Wound debridement so I suggested she return after things get better for her. 30 Day Review. Continue with the ITP.     12/31/15 [id number] 01/28/16 1212 02/21/16 1505 02/28/16 0719     ITP Comments 30 day review.  Continue with ITP   remains out with medical concerns 30 day review. Continue with ITP. Remains out for  medical reason. Hopes to return when wound vac removed and skin graft completed Pt has not returned to rehab since last medical review.  She has had a second wound vac placed 6/5. 30 day review.  Continue with ITP.  Myan remains out for wound care.        Comments:

## 2016-03-01 ENCOUNTER — Telehealth: Payer: Self-pay | Admitting: *Deleted

## 2016-03-01 NOTE — Telephone Encounter (Signed)
Pt informed of ONO results. Per DS, pt needs to continue night time O2. Pt informed to keep her f/u appt. Nothing further needed.

## 2016-03-05 ENCOUNTER — Ambulatory Visit (INDEPENDENT_AMBULATORY_CARE_PROVIDER_SITE_OTHER): Payer: Medicare Other | Admitting: Pharmacist

## 2016-03-05 DIAGNOSIS — Z953 Presence of xenogenic heart valve: Secondary | ICD-10-CM

## 2016-03-05 DIAGNOSIS — I340 Nonrheumatic mitral (valve) insufficiency: Secondary | ICD-10-CM

## 2016-03-05 DIAGNOSIS — I4891 Unspecified atrial fibrillation: Secondary | ICD-10-CM

## 2016-03-05 LAB — POCT INR: INR: 2.4

## 2016-03-08 ENCOUNTER — Encounter: Payer: Self-pay | Admitting: Pulmonary Disease

## 2016-03-08 ENCOUNTER — Ambulatory Visit (INDEPENDENT_AMBULATORY_CARE_PROVIDER_SITE_OTHER): Payer: Medicare Other | Admitting: Pulmonary Disease

## 2016-03-08 VITALS — BP 122/62 | HR 71 | Ht 61.5 in | Wt 131.0 lb

## 2016-03-08 DIAGNOSIS — J449 Chronic obstructive pulmonary disease, unspecified: Secondary | ICD-10-CM | POA: Diagnosis not present

## 2016-03-08 DIAGNOSIS — G4734 Idiopathic sleep related nonobstructive alveolar hypoventilation: Secondary | ICD-10-CM | POA: Diagnosis not present

## 2016-03-16 NOTE — Progress Notes (Signed)
PULMONARY OFFICE FOLLOW UP NOTE  Date of initial consultation: 07/27/15 Reason for consultation: COPD  Initial HPI:  51 F here to establish care for COPD. Previously followed by Dr Raul Del. She was first diagnosed with COPD approx 4 yrs ago at the time of a pneumonia (was not hospitalized). She has been treated with bronchodilator therapy. She has been on oxygen therapy since 12/15. Notably, at that time, she was suffering from CHF. She has since undergone Maze MVR and single vessel CABG. Her post op course was complicated by a wound infection. She has now recovered fully from that and is participating in cardiac rehab program. She is presently limited by fatigue and deconditioning more than by dyspnea. She walks slowly on a treadmill for 20 mins @ cardiac rehab. She can complete a flight of stairs. She has minimal cough and very infrequent sputum production.  She has been tried on [facility] which caused hoarseness. She was changed to a LABA which was not discernibly beneficial. She almost never uses her rescue inhaler. She also has a nebulizer @ home with Duoneb but she has not used this in months. She denies fever, hemoptysis, LE edema. She is up to date on flu and pneumococcal vaccines. Initial Impression (07/26/25): Chronic obstructive pulmonary disease, unspecified COPD type. Hypoxemia - resolved. Might have been due to CHF. Initial Data: 05/08/15 CT chest: 1. Expected postoperative changes associated with recent CABG procedure. 2. No subcutaneous fluid collections identified overlying the sternotomy defect. The components of the sternotomy defect appear well approximated and there is no evidence for sternal wire fracture. 3. A small low-attenuation fluid collection is identified within the anterior mediastinum/retrosternal space. This is nonspecific finding in the early postoperative time frame. 4. Small pleural effusions. 5. Prior granulomatous disease.  05/22/15 CXR: Hyperinflation. NAD  03/17/15 PFTs:  mild-mod obstruction, normal lung volumes, severe decrease DLCO.  Initial Plan (07/27/15): Trial of Incruse - once inhalation daily. Change O2 to nocturnal and PRN during day.   Subsequent data: CXR 08/17/15: No acute chest findings. Chronic postoperative changes as described. TTE 08/22/15: LVEF 30-35%. Diffuse HK. prosthetic valves functioning well ONO on RA 02/22/16: significant desaturation to low of 78%. Total desaturation events: 170  SUBJ: Still struggling with chronic sternal wound infection. Respiratory status is unchanged and she continues to have only mild DOE. Denies CP, fever, purulent sputum, hemoptysis, LE edema and calf tenderness. Here to review results of ONO on RA    Filed Vitals:   03/08/16 1212  BP: 122/62  Pulse: 71  Height: 5' 1.5" (1.562 m)  Weight: 131 lb (59.421 kg)  SpO2: 94%   EXAM:  Gen: WDWN in NAD HEENT: All WNL Neck: NO LAN, no JVD noted Lungs: slightly diminshed BS, no wheezes or rales Cardiovascular: Reg, no M noted Abdomen: Soft, NT +BS Ext: no C/C/E Neuro: CNs intact, motor/sens grossly intact  BMP Latest Ref Rng 02/12/2016 01/11/2016 12/21/2015  Glucose 65 - 99 mg/dL 125(H) 137(H) 127(H)  BUN 6 - 20 mg/dL 16 17 16   Creatinine 0.44 - 1.00 mg/dL 0.70 0.78 0.81  Sodium 135 - 145 mmol/L 140 139 141  Potassium 3.5 - 5.1 mmol/L 4.8 4.1 4.1  Chloride 101 - 111 mmol/L 103 103 104  CO2 22 - 32 mmol/L 29 24 23   Calcium 8.9 - 10.3 mg/dL 9.5 9.1 9.5    CBC Latest Ref Rng 02/12/2016 01/24/2016 01/11/2016  WBC 4.0 - 10.5 K/uL 9.9 - 9.0  Hemoglobin 12.0 - 15.0 g/dL 9.4(L) 10.2(L) 9.3(L)  Hematocrit  36.0 - 46.0 % 32.0(L) - 30.9(L)  Platelets 150 - 400 K/uL 281 - 246   CXR: NNF  IMPRESSION: COPD - mild with minimal symptoms and no significant benefit from inhaled bronchodilators Pulmonary hypertension- Group 2 - PH due to left heart disease (mitral valve disease) Nocturnal hypoxemia   PLAN: Cont nocturnal O2 which she is wearing reliably and presumably  benefiting from ROV PRN  Merton Border, MD PCCM service Mobile [phone] Pager [phone]

## 2016-03-20 ENCOUNTER — Encounter: Payer: Self-pay | Admitting: *Deleted

## 2016-03-20 DIAGNOSIS — Z952 Presence of prosthetic heart valve: Secondary | ICD-10-CM

## 2016-03-20 DIAGNOSIS — Z951 Presence of aortocoronary bypass graft: Secondary | ICD-10-CM

## 2016-03-27 ENCOUNTER — Encounter: Payer: Self-pay | Admitting: *Deleted

## 2016-03-27 ENCOUNTER — Encounter (HOSPITAL_COMMUNITY): Payer: Self-pay | Admitting: *Deleted

## 2016-03-27 DIAGNOSIS — Z951 Presence of aortocoronary bypass graft: Secondary | ICD-10-CM

## 2016-03-27 DIAGNOSIS — Z952 Presence of prosthetic heart valve: Secondary | ICD-10-CM

## 2016-03-27 NOTE — Progress Notes (Signed)
Cardiac Individual Treatment Plan  Patient Details  Name: [name] MRN: [id number] Date of Birth: 1934-10-03 Referring Provider:    Initial Encounter Date:       Cardiac Rehab from 07/03/2015 in [facility] Cardiac and Pulmonary Rehab   Date  07/03/15      Visit Diagnosis: S/P MVR (mitral valve replacement)  S/P CABG x 1  Patient's Home Medications on Admission:  Current outpatient prescriptions:  .  acetaminophen (TYLENOL) 325 MG tablet, Take 2 tablets (650 mg total) by mouth every 4 (four) hours as needed for mild pain (or Fever >/= 101)., Disp: , Rfl:  .  aspirin EC 81 MG EC tablet, Take 1 tablet (81 mg total) by mouth daily., Disp: , Rfl:  .  bisoprolol (ZEBETA) 10 MG tablet, Take 1 tablet (10 mg total) by mouth daily., Disp: 30 tablet, Rfl: 5 .  enoxaparin (LOVENOX) 30 MG/0.3ML injection, Inject 30 mg into the skin daily. , Disp: , Rfl:  .  fluticasone (FLONASE) 50 MCG/ACT nasal spray, Place 1 spray into both nostrils daily as needed for allergies. , Disp: , Rfl:  .  furosemide (LASIX) 20 MG tablet, Take 20 mg by mouth 2 (two) times daily. , Disp: , Rfl:  .  gabapentin (NEURONTIN) 100 MG capsule, Take 100 mg by mouth at bedtime. , Disp: , Rfl: 1 .  ipratropium-albuterol (DUONEB) 0.5-2.5 (3) MG/3ML SOLN, Take 3 mLs by nebulization every 6 (six) hours as needed (for shortness of breath)., Disp: 360 mL, Rfl: 10 .  levothyroxine (SYNTHROID, LEVOTHROID) 88 MCG tablet, Take 88 mcg by mouth daily before breakfast., Disp: , Rfl:  .  losartan (COZAAR) 100 MG tablet, Take 1 tablet (100 mg total) by mouth daily., Disp: 30 tablet, Rfl: 3 .  Multiple Vitamins-Minerals (PRESERVISION AREDS 2 PO), Take 2 tablets by mouth daily., Disp: , Rfl:  .  pantoprazole (PROTONIX) 40 MG tablet, Take 40 mg by mouth 2 (two) times daily. , Disp: , Rfl:  .  potassium chloride SA (K-DUR,KLOR-CON) 20 MEQ tablet, Take 20 mEq by mouth daily., Disp: , Rfl: 2 .  simvastatin (ZOCOR) 20 MG tablet, Take 20 mg by mouth  every evening. , Disp: , Rfl:  .  spironolactone (ALDACTONE) 25 MG tablet, Take 1 tablet (25 mg total) by mouth daily., Disp: 30 tablet, Rfl: 6 .  traMADol (ULTRAM) 50 MG tablet, Take 1 tablet (50 mg total) by mouth every 6 (six) hours as needed for moderate pain or severe pain., Disp: 30 tablet, Rfl: 0 .  warfarin (COUMADIN) 2.5 MG tablet, TAKE 1 TABLET BY MOUTH AS DIRECTED, Disp: 40 tablet, Rfl: 2  Past Medical History: Past Medical History  Diagnosis Date  . Hypertension   . COPD (chronic obstructive pulmonary disease) ([place])   . Hyperlipidemia   . Meniere disease   . PAF (paroxysmal atrial fibrillation) ([place])   . COPD (chronic obstructive pulmonary disease) ([place])   . Hypothyroidism   . Pulmonary hypertension ([place])   . Chronic diastolic CHF (congestive heart failure) ([place])   . PONV (postoperative nausea and vomiting)   . Anxiety   . GERD (gastroesophageal reflux disease)   . Severe Mitral Regurgitation s/p MV Repair     a. 04/2015 s/p 27 mm [facility] Mitral bovine bioprosthetic tissue valve  . Tricuspid Regurgitation s/p Repair     a. 04/2015 s/p 26 mm [facility] mc3 ring annuloplasty  . 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 04/2015 Complete bilateral atrial  lesion set using cryothermy and bipolar radiofrequency ablation with clipping of LA appendage  . CAD s/p CABG x 1     a. 04/2015 LIMA to LAD  . Post-surgical complete heart block, symptomatic     a. 04/2015 s/p MDT [id number] Advisa DR MRI DC PPM.  . Wound infection ([place]) 10/13/2015    Superficial sternal wound infection  . Cancer of right breast ([place]) 08/09/2001  . Presence of permanent cardiac pacemaker   . On home oxygen therapy     "2L at night" (11/10/2015)  . Pneumonia ~ 2010  . Arthritis     "hands" (11/10/2015)  . Surgical wound, non healing - chest wall 11/10/2015    Tobacco Use: History  Smoking status  . Former Smoker -- 1.50 packs/day for 40 years  . Types: Cigarettes  . Quit date: 04/20/1998  Smokeless tobacco   . Never Used    Labs: Recent Review Flowsheet Data    Labs for ITP Cardiac and Pulmonary Rehab Latest Ref Rng 04/28/2015 04/29/2015 04/30/2015 11/13/2015 11/14/2015   PHART 7.350 - 7.450 - - - 7.428 7.420   PCO2ART 35.0 - 45.0 mmHg - - - 41.1 49.4(H)   HCO3 20.0 - 24.0 mEq/L - - - 26.7(H) 31.5(H)   TCO2 0 - 100 mmol/L - 34 34 27.9 33.0   O2SAT - 65.3 61.7 - 95.2 98.4       Exercise Target Goals:    Exercise Program Goal: Individual exercise prescription set with THRR, safety & activity barriers. Participant demonstrates ability to understand and report RPE using BORG scale, to self-measure pulse accurately, and to acknowledge the importance of the exercise prescription.  Exercise Prescription Goal: Starting with aerobic activity 30 plus minutes a day, 3 days per week for initial exercise prescription. Provide home exercise prescription and guidelines that participant acknowledges understanding prior to discharge.  Activity Barriers & Risk Stratification:   6 Minute Walk:   Initial Exercise Prescription:   Perform Capillary Blood Glucose checks as needed.  Exercise Prescription Changes:     Exercise Prescription Changes      10/23/15 1100 11/20/15 1200         Exercise Review   Progression No  Absent since last review; last visit 08/09/15 No  Absent since last review; last visit 08/09/15      Response to Exercise   Comments Ex. Rx. will be re evaluated upon return due to extended absence Ex. Rx. will be re evaluated upon return due to extended absence      Duration Progress to 50 minutes of aerobic without signs/symptoms of physical distress Progress to 50 minutes of aerobic without signs/symptoms of physical distress      Intensity Rest + 30 Rest + 30      Progression   Progression Continue progressive overload as per policy without signs/symptoms or physical distress. Continue progressive overload as per policy without signs/symptoms or physical distress.       Resistance Training   Training Prescription (read-only) Yes       Weight (read-only) 2       Reps (read-only) 10-15       Treadmill   MPH (read-only) 2       Grade (read-only) 0       Minutes (read-only) 20       REL-XR   Level (read-only) 5       Watts (read-only) 45       Minutes (read-only) 15  Exercise Comments:     Exercise Comments      12/27/15 0654 01/24/16 0659 02/21/16 1504 03/20/16 1623     Exercise Comments The patient has not attended class since the last review. There has been no progress made due to lack of attendance. Workloads may need to be adjusted if the patient returns to class.  The patient has not attended class since the last review. There has been no progress made due to lack of attendance. Workloads may need to be adjusted if the patient returns to class.  Pt has not returned to rehab since last medical review. Pt has been out since last review.       Discharge Exercise Prescription (Final Exercise Prescription Changes):     Exercise Prescription Changes - 11/20/15 1200    Exercise Review   Progression No  Absent since last review; last visit 08/09/15   Response to Exercise   Comments Ex. Rx. will be re evaluated upon return due to extended absence   Duration Progress to 50 minutes of aerobic without signs/symptoms of physical distress   Intensity Rest + 30   Progression   Progression Continue progressive overload as per policy without signs/symptoms or physical distress.      Nutrition:  Target Goals: Understanding of nutrition guidelines, daily intake of sodium 1500mg , cholesterol 200mg , calories 30% from fat and 7% or less from saturated fats, daily to have 5 or more servings of fruits and vegetables.  Biometrics:    Nutrition Therapy Plan and Nutrition Goals:   Nutrition Discharge: Rate Your Plate Scores:   Nutrition Goals Re-Evaluation:     Nutrition Goals Re-Evaluation      11/27/15 1359           Personal Goal  #1 Re-Evaluation   Personal Goal #1 Velita has been out since 08/09/2015 due to wound debridement. Noted that Ketrina recently had a Irrigation and debridment of her chest wall per another dept EpIC charting.        Goal Progress Seen No          Psychosocial: Target Goals: Acknowledge presence or absence of depression, maximize coping skills, provide positive support system. Participant is able to verbalize types and ability to use techniques and skills needed for reducing stress and depression.  Initial Review & Psychosocial Screening:   Quality of Life Scores:   PHQ-9:     Recent Review Flowsheet Data    Depression screen [facility] 2/9 07/03/2015   Decreased Interest 0   Down, Depressed, Hopeless 0   PHQ - 2 Score 0   Altered sleeping 0   Tired, decreased energy 3   Change in appetite 0   Feeling bad or failure about yourself  0   Trouble concentrating 0   Moving slowly or fidgety/restless 0   Suicidal thoughts 0   PHQ-9 Score 3   Difficult doing work/chores Somewhat difficult      Psychosocial Evaluation and Intervention:   Psychosocial Re-Evaluation:     Psychosocial Re-Evaluation      11/13/15 1422 11/27/15 1425         Psychosocial Re-Evaluation   Comments I left vm for Anaih since she called on 11/06/2015 to see if she could return. I read EPIC note from today that mentioned an Infection and Wound debridement so I suggested she return after things get better for her. I called Bana's home and her husband answered. He said she just got out of [facility] for another debridment of  her chest wall.          Vocational Rehabilitation: Provide vocational rehab assistance to qualifying candidates.   Vocational Rehab Evaluation & Intervention:   Education: Education Goals: Education classes will be provided on a weekly basis, covering required topics. Participant will state understanding/return demonstration of topics presented.  Learning  Barriers/Preferences:   Education Topics: General Nutrition Guidelines/Fats and Fiber: -Group instruction provided by verbal, written material, models and posters to present the general guidelines for heart healthy nutrition. Gives an explanation and review of dietary fats and fiber.   Controlling Sodium/Reading Food Labels: -Group verbal and written material supporting the discussion of sodium use in heart healthy nutrition. Review and explanation with models, verbal and written materials for utilization of the food label.   Exercise Physiology & Risk Factors: - Group verbal and written instruction with models to review the exercise physiology of the cardiovascular system and associated critical values. Details cardiovascular disease risk factors and the goals associated with each risk factor.          Cardiac Rehab from 08/09/2015 in [facility] Cardiac and Pulmonary Rehab   Date  07/12/15   Educator  RM   Instruction Review Code  2- meets goals/outcomes      Aerobic Exercise & Resistance Training: - Gives group verbal and written discussion on the health impact of inactivity. On the components of aerobic and resistive training programs and the benefits of this training and how to safely progress through these programs.      Cardiac Rehab from 08/09/2015 in [facility] Cardiac and Pulmonary Rehab   Date  07/17/15   Educator  RM   Instruction Review Code  2- meets goals/outcomes      Flexibility, Balance, General Exercise Guidelines: - Provides group verbal and written instruction on the benefits of flexibility and balance training programs. Provides general exercise guidelines with specific guidelines to those with heart or lung disease. Demonstration and skill practice provided.      Cardiac Rehab from 08/09/2015 in [facility] Cardiac and Pulmonary Rehab   Date  07/24/15   Educator  RM   Instruction Review Code  2- meets goals/outcomes      Stress Management: - Provides group verbal and  written instruction about the health risks of elevated stress, cause of high stress, and healthy ways to reduce stress.      Cardiac Rehab from 08/09/2015 in [facility] Cardiac and Pulmonary Rehab   Date  07/26/15   Educator  [facility]   Instruction Review Code  2- meets goals/outcomes      Depression: - Provides group verbal and written instruction on the correlation between heart/lung disease and depressed mood, treatment options, and the stigmas associated with seeking treatment.   Anatomy & Physiology of the Heart: - Group verbal and written instruction and models provide basic cardiac anatomy and physiology, with the coronary electrical and arterial systems. Review of: AMI, Angina, Valve disease, Heart Failure, Cardiac Arrhythmia, Pacemakers, and the ICD.   Cardiac Procedures: - Group verbal and written instruction and models to describe the testing methods done to diagnose heart disease. Reviews the outcomes of the test results. Describes the treatment choices: Medical Management, Angioplasty, or Coronary Bypass Surgery.   Cardiac Medications: - Group verbal and written instruction to review commonly prescribed medications for heart disease. Reviews the medication, class of the drug, and side effects. Includes the steps to properly store meds and maintain the prescription regimen.   Go Sex-Intimacy & Heart Disease, Get SMART - Goal  Setting: - Group verbal and written instruction through game format to discuss heart disease and the return to sexual intimacy. Provides group verbal and written material to discuss and apply goal setting through the application of the S.M.A.R.T. Method.   Other Matters of the Heart: - Provides group verbal, written materials and models to describe Heart Failure, Angina, Valve Disease, and Diabetes in the realm of heart disease. Includes description of the disease process and treatment options available to the cardiac patient.      Cardiac Rehab from 08/09/2015 in  [facility] Cardiac and Pulmonary Rehab   Date  08/09/15   Educator  SB   Instruction Review Code  2- meets goals/outcomes      Exercise & Equipment Safety: - Individual verbal instruction and demonstration of equipment use and safety with use of the equipment.      Cardiac Rehab from 08/09/2015 in [facility] Cardiac and Pulmonary Rehab   Date  07/03/15   Educator  SB   Instruction Review Code  2- meets goals/outcomes      Infection Prevention: - Provides verbal and written material to individual with discussion of infection control including proper hand washing and proper equipment cleaning during exercise session.      Cardiac Rehab from 08/09/2015 in [facility] Cardiac and Pulmonary Rehab   Date  07/03/15   Educator  Sb   Instruction Review Code  2- meets goals/outcomes      Falls Prevention: - Provides verbal and written material to individual with discussion of falls prevention and safety.      Cardiac Rehab from 08/09/2015 in [facility] Cardiac and Pulmonary Rehab   Date  07/03/15   Educator  Sb   Instruction Review Code  2- meets goals/outcomes      Diabetes: - Individual verbal and written instruction to review signs/symptoms of diabetes, desired ranges of glucose level fasting, after meals and with exercise. Advice that pre and post exercise glucose checks will be done for 3 sessions at entry of program.    Knowledge Questionnaire Score:   Core Components/Risk Factors/Patient Goals at Admission:   Core Components/Risk Factors/Patient Goals Review:      Goals and Risk Factor Review      10/23/15 1108 11/13/15 1422 11/27/15 1401 11/27/15 1424     Core Components/Risk Factors/Patient Goals Review   Personal Goals Review   Sedentary Sedentary    Review  I left vm for Vaune since she called on 11/06/2015 to see if she could return. I read EPIC note from today that mentioned an Infection and Wound debridement so I suggested she return after things get better for her. Kaylena has been out  since 08/09/2015 due to wound debridement. Noted that Tomika recently had a Irrigation and debridment of her chest wall per another dept EpIC charting.  I called Ayat's home and her husband answered. He said she just got out of [facility] for another debridment of her chest wall.     Expected Outcomes   Clear of infection to be able to exercise.      Increase Aerobic Exercise and Physical Activity (read-only)   Goals Progress/Improvement seen  No       Comments Ex. Rx. will be re-evaluated upon return due to extende absence          Core Components/Risk Factors/Patient Goals at Discharge (Final Review):      Goals and Risk Factor Review - 11/27/15 1424    Core Components/Risk Factors/Patient Goals Review   Personal  Goals Review Sedentary   Review I called Samanda's home and her husband answered. He said she just got out of [facility] for another debridment of her chest wall.       ITP Comments:     ITP Comments      10/05/15 1131 10/31/15 0751 11/13/15 1422 11/30/15 1244 12/31/15 0925   ITP Comments Ready for 30 day review. Continue with ITP. Remains out with medical reasons 30 day review.  Continue with ITP. Continues to remain out for medical reasons I left vm for Dalli since she called on 11/06/2015 to see if she could return. I read EPIC note from today that mentioned an Infection and Wound debridement so I suggested she return after things get better for her. 30 Day Review. Continue with the ITP. 30 day review.  Continue with ITP   remains out with medical concerns     01/28/16 1212 02/21/16 1505 02/28/16 0719 03/20/16 1623 03/27/16 0732   ITP Comments 30 day review. Continue with ITP. Remains out for medical reason. Hopes to return when wound vac removed and skin graft completed Pt has not returned to rehab since last medical review.  She has had a second wound vac placed 6/5. 30 day review.  Continue with ITP.  Teianna remains out for wound care.  Pt has been out since last  review. 30 day review. Continue with ITP remains out with medical reason PLAns to return when given clearance      Comments:

## 2016-03-27 NOTE — Progress Notes (Signed)
Spoke with Tomi Bamberger from [employer] to make aware that " Procedure may interfere with device function.  Magnet should be placed over device during procedure. Post -op interrogation is NOT needed.

## 2016-03-27 NOTE — Progress Notes (Signed)
Pt stated, my last dose of Coumadin was Sunday, then I started taking a shot, my last shot was this morning; I didn't stop taking Aspirin because they didn't tell me to stop." Pt verbalized understanding of all pre-op instructions.

## 2016-03-27 NOTE — H&P (Signed)
[name] is an 81 y.o. female.   Chief Complaint: chest wall defect/wound HPI: The patient is a 81yo female being treated for her right chest wall defect with a latissimus muscle flap on 11/18/15. She developed an open wound of her right chest wall following CABG x 1 and MVR in August of 2016. The sternal incision had ongoing difficulty healing and eventually developed an open wound with drainage. She had remotely undergone treatment to the right chest area for right breast cancer and it was felt that this may be way she was having difficulty healing post operatively. She underwent excisional debridement by CT surgery and we consulted in the hospital for coverage of the residual wound/defect. The latissimus flap was done but there was some superficial loss of the skin over the muscle post operatively and the patient was taken back to the OR x 3 for debridement of the skin/superficial tissue loss over the muscle flap with placement of Acell and VAC dressing.She underwent another debridement and partial closure of the lateral margin of her wound and application of more Acell last week in the OR. She had some drainage at this time and cultures from the OR have shown no growth. She is seen today for post operative follow up She is anticoagulated on Coumadin. She is on warfarin with her last INR 2.4.  Past Medical History  Diagnosis Date  . Hypertension   . COPD (chronic obstructive pulmonary disease) ([place])   . Hyperlipidemia   . Meniere disease   . PAF (paroxysmal atrial fibrillation) ([name])   . COPD (chronic obstructive pulmonary disease) ([place])   . Hypothyroidism   . Pulmonary hypertension ([place])   . Chronic diastolic CHF (congestive heart failure) ([place])   . PONV (postoperative nausea and vomiting)   . Anxiety   . GERD (gastroesophageal reflux disease)   . Severe Mitral Regurgitation s/p MV Repair     a. 04/2015 s/p 27 mm [facility] Mitral bovine bioprosthetic tissue valve  . Tricuspid  Regurgitation s/p Repair     a. 04/2015 s/p 26 mm Edwards mc3 ring annuloplasty  . 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 04/2015 Complete bilateral atrial lesion set using cryothermy and bipolar radiofrequency ablation with clipping of LA appendage  . CAD s/p CABG x 1     a. 04/2015 LIMA to LAD  . Post-surgical complete heart block, symptomatic     a. 04/2015 s/p MDT [id number] Advisa DR MRI DC PPM.  . Wound infection ([place]) 10/13/2015    Superficial sternal wound infection  . Cancer of right breast ([place]) 08/09/2001  . Presence of permanent cardiac pacemaker   . On home oxygen therapy     "2L at night" (11/10/2015)  . Pneumonia ~ 2010  . Arthritis     "hands" (11/10/2015)  . Surgical wound, non healing - chest wall 11/10/2015    Past Surgical History  Procedure Laterality Date  . Cochlear implant Left 2005?  Marland Kitchen Cardiac catheterization  11/2013    [facility]  . Cardiac catheterization  10/2014    [facility]  . Mitral valve repair N/A 04/25/2015    Procedure: MITRAL VALVE  REPLACEMENT using a 27 mm Edwards Perimount Magna Mitral Ease Valve;  Surgeon: Rexene Alberts, MD;  Location: [place];  Service: Open Heart Surgery;  Laterality: N/A;  . Tricuspid valve replacement N/A 04/25/2015    Procedure: TRICUSPID VALVE REPAIR;  Surgeon: Rexene Alberts, MD;  Location: [place];  Service:  Open Heart Surgery;  Laterality: N/A;  . Maze N/A 04/25/2015    Procedure: MAZE;  Surgeon: Rexene Alberts, MD;  Location: [place];  Service: Open Heart Surgery;  Laterality: N/A;  . Tee without cardioversion N/A 04/25/2015    Procedure: TRANSESOPHAGEAL ECHOCARDIOGRAM (TEE);  Surgeon: Rexene Alberts, MD;  Location: [place];  Service: Open Heart Surgery;  Laterality: N/A;  . Clipping of atrial appendage N/A 04/25/2015    Procedure: CLIPPING OF ATRIAL APPENDAGE;  Surgeon: Rexene Alberts, MD;  Location: [place];  Service: Open Heart Surgery;  Laterality: N/A;  . Ep implantable device N/A 05/02/2015    Procedure: Pacemaker Implant;  Surgeon: Thompson Grayer, MD;  Location: [place] [facility];  Service: Cardiovascular;  Laterality: N/A;  . Sternal wound debridement N/A 05/09/2015    Procedure: STERNAL WOUND DEBRIDEMENT;  Surgeon: Rexene Alberts, MD;  Location: [place];  Service: Thoracic;  Laterality: N/A;  . Application of wound vac N/A 05/09/2015    Procedure: APPLICATION OF WOUND VAC;  Surgeon: Rexene Alberts, MD;  Location: [place];  Service: Thoracic;  Laterality: N/A;  . Sternal wires removal N/A 06/05/2015    Procedure: STERNAL WIRES REMOVAL;  Surgeon: Rexene Alberts, MD;  Location: [place];  Service: Thoracic;  Laterality: N/A;  . Tonsillectomy    . Breast biopsy Left 11/25/06    neg  . Breast biopsy Left 01/20/12    /clip-neg  . Mastectomy, partial Right 2002  . Vaginal hysterectomy    . Dilation and curettage of uterus  "several before hysterectomy"  . Cataract extraction w/ intraocular lens  implant, bilateral Bilateral 2014  . Cardiac valve replacement    . Insert / replace / remove pacemaker    . Coronary artery bypass graft N/A 04/25/2015    Procedure: CORONARY ARTERY BYPASS GRAFTING (CABG) x ONE, using left internal mammary artery;  Surgeon: Rexene Alberts, MD;  Location: [place];  Service: Open Heart Surgery;  Laterality: N/A;  . Sternal wound debridement N/A 11/13/2015    Procedure: Excisional drainage of RIGHT Chest wall mass and breast mass ;  Surgeon: Rexene Alberts, MD;  Location: [place];  Service: Thoracic;  Laterality: N/A;  . Application of wound vac N/A 11/13/2015    Procedure: APPLICATION OF WOUND VAC;  Surgeon: Rexene Alberts, MD;  Location: [place];  Service: Thoracic;  Laterality: N/A;  . Incision and drainage of wound N/A 11/21/2015    Procedure: IRRIGATION AND DEBRIDEMENT WOUND;  Surgeon: Loel Lofty Dillingham, DO;  Location: [place];  Service: Plastics;  Laterality: N/A;  . Application of wound vac N/A 11/21/2015    Procedure: APPLICATION OF WOUND VAC;  Surgeon: Loel Lofty Dillingham, DO;  Location: [place];  Service: Plastics;   Laterality: N/A;  . Application of a-cell of chest/abdomen N/A 11/21/2015    Procedure: APPLICATION OF A-CELL OF CHEST/ABDOMEN;  Surgeon: Loel Lofty Dillingham, DO;  Location: [place];  Service: Plastics;  Laterality: N/A;  . Tram Right 11/18/2015    Procedure: VRAM Vertical Rectus Abdominus Muscle Flap;  Surgeon: Loel Lofty Dillingham, DO;  Location: [place];  Service: Plastics;  Laterality: Right;  RIght Back  . I&d extremity Right 12/06/2015    Procedure: IRRIGATION AND DEBRIDEMENT RIGHT CHEST WALL WITH ACELL PLACEMENT AND VAC;  Surgeon: Loel Lofty Dillingham, DO;  Location: [place];  Service: Plastics;  Laterality: Right;  . Incision and drainage of wound Right 12/21/2015    Procedure: IRRIGATION AND DEBRIDEMENT right  chest wall WOUND;  Surgeon: Loel Lofty Dillingham, DO;  Location: [place];  Service: Plastics;  Laterality: Right;  right chest wall   . Application of wound vac Right 12/21/2015    Procedure: APPLICATION OF WOUND VAC to right chest wall ;  Surgeon: Loel Lofty Dillingham, DO;  Location: [place];  Service: Plastics;  Laterality: Right;  . Application of a-cell of chest/abdomen Right 12/21/2015    Procedure: APPLICATION OF A-CELL OF RIGHT CHEST;  Surgeon: Loel Lofty Dillingham, DO;  Location: [place];  Service: Plastics;  Laterality: Right;  . Irrigation and debridement of wound with split thickness skin graft Right 01/11/2016    Procedure: IRRIGATION AND DEBRIDEMENT OF RIGHT CHEST WOUND ;  Surgeon: Loel Lofty Dillingham, DO;  Location: [place];  Service: Plastics;  Laterality: Right;  . Application of a-cell of chest/abdomen Right 01/11/2016    Procedure: APPLICATION OF A-CELL OF RIGHT CHEST;  Surgeon: Loel Lofty Dillingham, DO;  Location: [place];  Service: Plastics;  Laterality: Right;  . Application of wound vac Right 01/11/2016    Procedure: APPLICATION OF WOUND VAC RIGHT CHEST ;  Surgeon: Loel Lofty Dillingham, DO;  Location: [place];  Service: Plastics;  Laterality: Right;  . Incision and drainage of wound Right 01/24/2016     Procedure: IRRIGATION AND DEBRIDEMENT RIGHT CHEST WALL WOUND;  Surgeon: Loel Lofty Dillingham, DO;  Location: [place];  Service: Plastics;  Laterality: Right;  . Application of wound vac Right 01/24/2016    Procedure: APPLICATION OF WOUND VAC RIGHT CHEST WALL;  Surgeon: Loel Lofty Dillingham, DO;  Location: [place];  Service: Plastics;  Laterality: Right;  . Skin split graft Right 02/12/2016    Procedure: IRRIGATION AND DEBRIDEMENT RIGHT CHEST WOUND;  Surgeon: Loel Lofty Dillingham, DO;  Location: [place];  Service: Plastics;  Laterality: Right;  . Application of a-cell of chest/abdomen Right 02/12/2016    Procedure: APPLICATION OF A-CELL TO RIGHT CHEST WOUND;  Surgeon: Loel Lofty Dillingham, DO;  Location: [place];  Service: Plastics;  Laterality: Right;  . Application of wound vac Right 02/12/2016    Procedure: RE-APPLICATION OF WOUND VAC TO RIGHT CHEST WOUND;  Surgeon: Loel Lofty Dillingham, DO;  Location: [place];  Service: Plastics;  Laterality: Right;    Family History  Problem Relation Age of Onset  . Heart disease Father   . Heart disease Brother   . Heart attack Paternal Uncle    Social History:  reports that she quit smoking about 17 years ago. Her smoking use included Cigarettes. She has a 60 pack-year smoking history. She has never used smokeless tobacco. She reports that she drinks alcohol. She reports that she does not use illicit drugs.  Allergies:  Allergies  Allergen Reactions  . Ace Inhibitors Cough  . Augmentin [Amoxicillin-Pot Clavulanate] Swelling    JOINTS, PAIN  . Penicillins Swelling    SWOLLEN JOINTS  Has patient had a PCN reaction causing immediate rash, facial/tongue/throat swelling, SOB or lightheadedness with hypotension: Yes Has patient had a PCN reaction causing severe rash involving mucus membranes or skin necrosis: No Has patient had a PCN reaction that required hospitalization No Has patient had a PCN reaction occurring within the last 10 years: No If all of the above  answers are "NO", then may proceed with Cephalosporin use.   . Nifedipine Other (See Comments)    UNSPECIFIED  . Propranolol Other (See Comments)    UNSPECIFIED  . Diazepam Other (See Comments)    Made her "hyper"  .  Meperidine Other (See Comments)    Made her "hyper"  . Propoxyphene Other (See Comments)    Made her "hyper"    No prescriptions prior to admission    No results found for this or any previous visit (from the past 48 hour(s)). No results found.  Review of Systems  Constitutional: Negative.   HENT: Negative.   Eyes: Negative.   Respiratory: Negative.   Cardiovascular: Negative.   Gastrointestinal: Negative.   Genitourinary: Negative.   Musculoskeletal: Negative.   Skin: Negative.   Neurological: Negative.   Psychiatric/Behavioral: Negative.     There were no vitals taken for this visit. Physical Exam  Constitutional: She is oriented to person, place, and time. She appears well-developed and well-nourished.  HENT:  Head: Normocephalic and atraumatic.  Eyes: Conjunctivae and EOM are normal. Pupils are equal, round, and reactive to light.  Respiratory: Effort normal.  GI: Soft. She exhibits no distension.  Neurological: She is alert and oriented to person, place, and time.  Skin: Skin is warm.  Psychiatric: She has a normal mood and affect. Her behavior is normal. Judgment and thought content normal.     Assessment/Plan Plan for debridement with Acell and possible VAC placement.  Wallace Going, DO 03/27/2016, 4:13 PM

## 2016-03-28 ENCOUNTER — Encounter (HOSPITAL_COMMUNITY)
Admission: RE | Disposition: A | Discharge status: Home / Self Care | Payer: Self-pay | Source: Ambulatory Visit | Attending: Plastic Surgery

## 2016-03-28 ENCOUNTER — Ambulatory Visit (HOSPITAL_COMMUNITY)
Admission: RE | Admit: 2016-03-28 | Discharge: 2016-03-28 | Disposition: A | Discharge status: Home / Self Care | Payer: Medicare Other | Source: Ambulatory Visit | Attending: Plastic Surgery | Admitting: Plastic Surgery

## 2016-03-28 ENCOUNTER — Ambulatory Visit (HOSPITAL_COMMUNITY): Payer: Medicare Other

## 2016-03-28 ENCOUNTER — Ambulatory Visit (HOSPITAL_COMMUNITY): Payer: Medicare Other | Admitting: Anesthesiology

## 2016-03-28 ENCOUNTER — Encounter (HOSPITAL_COMMUNITY): Payer: Self-pay | Admitting: Surgery

## 2016-03-28 DIAGNOSIS — Z87891 Personal history of nicotine dependence: Secondary | ICD-10-CM | POA: Insufficient documentation

## 2016-03-28 DIAGNOSIS — Z880 Allergy status to penicillin: Secondary | ICD-10-CM | POA: Diagnosis not present

## 2016-03-28 DIAGNOSIS — E785 Hyperlipidemia, unspecified: Secondary | ICD-10-CM | POA: Diagnosis not present

## 2016-03-28 DIAGNOSIS — Z923 Personal history of irradiation: Secondary | ICD-10-CM | POA: Diagnosis not present

## 2016-03-28 DIAGNOSIS — Z9981 Dependence on supplemental oxygen: Secondary | ICD-10-CM | POA: Insufficient documentation

## 2016-03-28 DIAGNOSIS — I700 Atherosclerosis of aorta: Secondary | ICD-10-CM | POA: Diagnosis not present

## 2016-03-28 DIAGNOSIS — E039 Hypothyroidism, unspecified: Secondary | ICD-10-CM | POA: Insufficient documentation

## 2016-03-28 DIAGNOSIS — T8189XA Other complications of procedures, not elsewhere classified, initial encounter: Secondary | ICD-10-CM | POA: Insufficient documentation

## 2016-03-28 DIAGNOSIS — J449 Chronic obstructive pulmonary disease, unspecified: Secondary | ICD-10-CM | POA: Diagnosis not present

## 2016-03-28 DIAGNOSIS — T798XXA Other early complications of trauma, initial encounter: Secondary | ICD-10-CM

## 2016-03-28 DIAGNOSIS — I5032 Chronic diastolic (congestive) heart failure: Secondary | ICD-10-CM | POA: Insufficient documentation

## 2016-03-28 DIAGNOSIS — T797XXA Traumatic subcutaneous emphysema, initial encounter: Secondary | ICD-10-CM | POA: Insufficient documentation

## 2016-03-28 DIAGNOSIS — I2510 Atherosclerotic heart disease of native coronary artery without angina pectoris: Secondary | ICD-10-CM | POA: Diagnosis not present

## 2016-03-28 DIAGNOSIS — Z853 Personal history of malignant neoplasm of breast: Secondary | ICD-10-CM | POA: Diagnosis not present

## 2016-03-28 DIAGNOSIS — I480 Paroxysmal atrial fibrillation: Secondary | ICD-10-CM | POA: Insufficient documentation

## 2016-03-28 DIAGNOSIS — Z955 Presence of coronary angioplasty implant and graft: Secondary | ICD-10-CM | POA: Insufficient documentation

## 2016-03-28 DIAGNOSIS — K219 Gastro-esophageal reflux disease without esophagitis: Secondary | ICD-10-CM | POA: Insufficient documentation

## 2016-03-28 DIAGNOSIS — B999 Unspecified infectious disease: Secondary | ICD-10-CM | POA: Insufficient documentation

## 2016-03-28 DIAGNOSIS — Y838 Other surgical procedures as the cause of abnormal reaction of the patient, or of later complication, without mention of misadventure at the time of the procedure: Secondary | ICD-10-CM | POA: Insufficient documentation

## 2016-03-28 DIAGNOSIS — I110 Hypertensive heart disease with heart failure: Secondary | ICD-10-CM | POA: Diagnosis not present

## 2016-03-28 DIAGNOSIS — T814XXA Infection following a procedure, initial encounter: Secondary | ICD-10-CM | POA: Insufficient documentation

## 2016-03-28 HISTORY — PX: INCISION AND DRAINAGE OF WOUND: SHX1803

## 2016-03-28 LAB — CBC
HCT: 29.8 % — ABNORMAL LOW (ref 36.0–46.0)
Hemoglobin: 8.7 g/dL — ABNORMAL LOW (ref 12.0–15.0)
MCH: 26 pg (ref 26.0–34.0)
MCHC: 29.2 g/dL — ABNORMAL LOW (ref 30.0–36.0)
MCV: 89 fL (ref 78.0–100.0)
PLATELETS: 267 10*3/uL (ref 150–400)
RBC: 3.35 MIL/uL — ABNORMAL LOW (ref 3.87–5.11)
RDW: 16.7 % — AB (ref 11.5–15.5)
WBC: 8.9 10*3/uL (ref 4.0–10.5)

## 2016-03-28 LAB — BASIC METABOLIC PANEL
ANION GAP: 9 (ref 5–15)
BUN: 18 mg/dL (ref 6–20)
CO2: 27 mmol/L (ref 22–32)
Calcium: 9.8 mg/dL (ref 8.9–10.3)
Chloride: 102 mmol/L (ref 101–111)
Creatinine, Ser: 0.8 mg/dL (ref 0.44–1.00)
GFR calc non Af Amer: >60 mL/min (ref >60)
GLUCOSE: 108 mg/dL — AB (ref 65–99)
Potassium: 4.5 mmol/L (ref 3.5–5.1)
SODIUM: 138 mmol/L (ref 135–145)

## 2016-03-28 LAB — PROTIME-INR
INR: 1.25 (ref 0.00–1.49)
Prothrombin Time: 15.9 seconds — ABNORMAL HIGH (ref 11.6–15.2)

## 2016-03-28 LAB — APTT: aPTT: 33 seconds (ref 24–37)

## 2016-03-28 SURGERY — IRRIGATION AND DEBRIDEMENT WOUND
Anesthesia: General | Site: Chest | Laterality: Right

## 2016-03-28 MED ORDER — FENTANYL CITRATE (PF) 100 MCG/2ML IJ SOLN: 25.0000 ug | INTRAMUSCULAR | Status: DC | PRN

## 2016-03-28 MED ORDER — POLYMYXIN B SULFATE 500000 UNITS IJ SOLR
INTRAMUSCULAR | Status: DC | PRN
  Administered 2016-03-28: 500 mL

## 2016-03-28 MED ORDER — FENTANYL CITRATE (PF) 100 MCG/2ML IJ SOLN
INTRAMUSCULAR | Status: DC | PRN
  Administered 2016-03-28: 50 ug via INTRAVENOUS

## 2016-03-28 MED ORDER — FENTANYL CITRATE (PF) 250 MCG/5ML IJ SOLN
INTRAMUSCULAR | Status: AC
  Filled 2016-03-28: qty 5

## 2016-03-28 MED ORDER — LIDOCAINE HCL (CARDIAC) 20 MG/ML IV SOLN
INTRAVENOUS | Status: DC | PRN
  Administered 2016-03-28: 80 mg via INTRAVENOUS

## 2016-03-28 MED ORDER — PROPOFOL 10 MG/ML IV BOLUS
INTRAVENOUS | Status: AC
  Filled 2016-03-28: qty 20

## 2016-03-28 MED ORDER — LACTATED RINGERS IV SOLN
INTRAVENOUS | Status: DC | PRN
  Administered 2016-03-28: 08:00:00 via INTRAVENOUS

## 2016-03-28 MED ORDER — CIPROFLOXACIN IN D5W 400 MG/200ML IV SOLN
INTRAVENOUS | Status: AC
  Filled 2016-03-28: qty 200

## 2016-03-28 MED ORDER — BUPIVACAINE-EPINEPHRINE (PF) 0.25% -1:200000 IJ SOLN
INTRAMUSCULAR | Status: AC
  Filled 2016-03-28: qty 30

## 2016-03-28 MED ORDER — ONDANSETRON HCL 4 MG/2ML IJ SOLN
INTRAMUSCULAR | Status: AC
  Filled 2016-03-28: qty 2

## 2016-03-28 MED ORDER — ONDANSETRON HCL 4 MG/2ML IJ SOLN
INTRAMUSCULAR | Status: DC | PRN
  Administered 2016-03-28: 4 mg via INTRAVENOUS

## 2016-03-28 MED ORDER — BUPIVACAINE-EPINEPHRINE 0.25% -1:200000 IJ SOLN
INTRAMUSCULAR | Status: DC | PRN
  Administered 2016-03-28: 3 mL

## 2016-03-28 MED ORDER — SODIUM CHLORIDE 0.9 % IR SOLN
Status: DC | PRN
  Administered 2016-03-28: 1000 mL

## 2016-03-28 MED ORDER — PHENYLEPHRINE HCL 10 MG/ML IJ SOLN
INTRAMUSCULAR | Status: DC | PRN
  Administered 2016-03-28 (×2): 80 ug via INTRAVENOUS

## 2016-03-28 MED ORDER — IOPAMIDOL (ISOVUE-300) INJECTION 61%
INTRAVENOUS | Status: AC
Start: 2016-03-28 — End: 2016-03-28
  Administered 2016-03-28: 10:00:00
  Filled 2016-03-28: qty 100

## 2016-03-28 SURGICAL SUPPLY — 48 items
APPLICATOR COTTON TIP 6IN STRL (MISCELLANEOUS) ×3 IMPLANT
BAG DECANTER FOR FLEXI CONT (MISCELLANEOUS) IMPLANT
BENZOIN TINCTURE PRP APPL 2/3 (GAUZE/BANDAGES/DRESSINGS) IMPLANT
CANISTER SUCTION 2500CC (MISCELLANEOUS) ×3 IMPLANT
CONT SPEC STER OR (MISCELLANEOUS) IMPLANT
COVER SURGICAL LIGHT HANDLE (MISCELLANEOUS) ×3 IMPLANT
DRAPE IMP U-DRAPE 54X76 (DRAPES) IMPLANT
DRAPE INCISE IOBAN 66X45 STRL (DRAPES) IMPLANT
DRAPE LAPAROSCOPIC ABDOMINAL (DRAPES) IMPLANT
DRAPE PED LAPAROTOMY (DRAPES) IMPLANT
DRAPE PROXIMA HALF (DRAPES) IMPLANT
DRESSING HYDROCOLLOID 4X4 (GAUZE/BANDAGES/DRESSINGS) IMPLANT
DRSG ADAPTIC 3X8 NADH LF (GAUZE/BANDAGES/DRESSINGS) IMPLANT
DRSG CUTIMED SORBACT 7X9 (GAUZE/BANDAGES/DRESSINGS) ×3 IMPLANT
DRSG PAD ABDOMINAL 8X10 ST (GAUZE/BANDAGES/DRESSINGS) ×3 IMPLANT
DRSG VAC ATS LRG SENSATRAC (GAUZE/BANDAGES/DRESSINGS) IMPLANT
DRSG VAC ATS MED SENSATRAC (GAUZE/BANDAGES/DRESSINGS) IMPLANT
DRSG VAC ATS SM SENSATRAC (GAUZE/BANDAGES/DRESSINGS) IMPLANT
ELECT CAUTERY BLADE 6.4 (BLADE) IMPLANT
ELECT REM PT RETURN 9FT ADLT (ELECTROSURGICAL) ×3
ELECTRODE REM PT RTRN 9FT ADLT (ELECTROSURGICAL) ×1 IMPLANT
GAUZE SPONGE 4X4 12PLY STRL (GAUZE/BANDAGES/DRESSINGS) IMPLANT
GLOVE BIO SURGEON STRL SZ 6.5 (GLOVE) ×2 IMPLANT
GLOVE BIO SURGEONS STRL SZ 6.5 (GLOVE) ×1
GOWN STRL REUS W/ TWL LRG LVL3 (GOWN DISPOSABLE) ×3 IMPLANT
GOWN STRL REUS W/TWL LRG LVL3 (GOWN DISPOSABLE) ×6
KIT BASIN OR (CUSTOM PROCEDURE TRAY) ×3 IMPLANT
KIT ROOM TURNOVER OR (KITS) ×3 IMPLANT
MATRIX SURGICAL PSM 5X5CM (Tissue) ×3 IMPLANT
MICROMATRIX 500MG (Tissue) ×6 IMPLANT
NS IRRIG 1000ML POUR BTL (IV SOLUTION) ×3 IMPLANT
PACK GENERAL/GYN (CUSTOM PROCEDURE TRAY) ×3 IMPLANT
PACK UNIVERSAL I (CUSTOM PROCEDURE TRAY) ×3 IMPLANT
PAD ARMBOARD 7.5X6 YLW CONV (MISCELLANEOUS) ×6 IMPLANT
SOLUTION PARTIC MCRMTRX 500MG (Tissue) ×2 IMPLANT
SPONGE GAUZE 4X4 12PLY STER LF (GAUZE/BANDAGES/DRESSINGS) ×3 IMPLANT
STAPLER VISISTAT 35W (STAPLE) IMPLANT
SURGILUBE 2OZ TUBE FLIPTOP (MISCELLANEOUS) IMPLANT
SUT MNCRL AB 4-0 PS2 18 (SUTURE) ×3 IMPLANT
SUT SILK 4 0 PS 2 (SUTURE) ×3 IMPLANT
SUT VIC AB 5-0 PS2 18 (SUTURE) ×6 IMPLANT
SWAB COLLECTION DEVICE MRSA (MISCELLANEOUS) ×3 IMPLANT
SWAB CULTURE ESWAB REG 1ML (MISCELLANEOUS) ×3 IMPLANT
SYR CONTROL 10ML LL (SYRINGE) ×3 IMPLANT
TAPE CLOTH SURG 4X10 WHT LF (GAUZE/BANDAGES/DRESSINGS) ×3 IMPLANT
TOWEL OR 17X26 10 PK STRL BLUE (TOWEL DISPOSABLE) ×3 IMPLANT
TUBE ANAEROBIC SPECIMEN COL (MISCELLANEOUS) IMPLANT
UNDERPAD 30X30 INCONTINENT (UNDERPADS AND DIAPERS) IMPLANT

## 2016-03-28 NOTE — Op Note (Signed)
Operative Note   DATE OF OPERATION: 03/28/2016  LOCATION:  [facility]  SURGICAL DIVISION: Plastic Surgery  PREOPERATIVE DIAGNOSES:  Chest Wall Defect  2 x 3 cm  POSTOPERATIVE DIAGNOSES:  same  PROCEDURE:   1. Preparation of chest wall defect for placement of Acell (5 x 5 cm sheet and 1gm powder).  SURGEON: Claire Sanger Dillingham, DO  ANESTHESIA:  General.   COMPLICATIONS: None.   INDICATIONS FOR PROCEDURE:  The patient, [name] is a 81 y.o. female born on 1935-02-02, is here for treatment of a chest wall defect.  She underwent a valve replacement and CABG.  She had wound breakdown at the sternal incision site that healed and then had inferior and right sided chest / cartilage breakdown.  This is likely due to the radiation she received for treatment of her right breast cancer. She underwent a latissimus muscle flap and had superficial loss.  She had debridement and Acell placed several weeks ago and returns for further care. MRN: [id number]  CONSENT:  Informed consent was obtained directly from the patient. Risks, benefits and alternatives were fully discussed. Specific risks including but not limited to bleeding, infection, hematoma, seroma, scarring, pain, infection, contracture, asymmetry, wound healing problems, and need for further surgery were all discussed. The patient did have an ample opportunity to have questions answered to satisfaction.   DESCRIPTION OF PROCEDURE:  The patient was taken to the operating room. SCDs were placed and IV antibiotics were given. The patient's operative site was prepped and draped in a sterile fashion. A time out was performed and all information was confirmed to be correct.  General anesthesia was administered.  The curette was used to freshen the base of the wound.  There was ectopic bone in the area. There was a small amount of yellow drainage from the base of the wound that tracked deep.  This was cultured for aerobic and  anaerobic.  The area was irrigated with antibiotic solution.  Local was placed in the superior portion of the wound at the level of the breast.  The skin was debrided.  Then it was closed 1 cm laterally with 4-0 Monocryl vertical mattress sutures. The Acell powder (700 mg) and sheet (2x 4 cm) were applied and secured with 5-0 Vicryl.   There is improved granulation but not ready for a skin graft. The sorbac was applied and secured with a 4-0 Silk.  There was an excellent seal.  The patient tolerated the procedure well.  There were no complications. The patient was allowed to wake from anesthesia, extubated and taken to the recovery room in satisfactory condition.

## 2016-03-28 NOTE — Transfer of Care (Signed)
Immediate Anesthesia Transfer of Care Note  Patient: [name]  Procedure(s) Performed: Procedure(s): IRRIGATION AND DEBRIDEMENT RIGHT CHEST WALL WOUND WITH A Cell Placement (Right)  Patient Location: PACU  Anesthesia Type:General  Level of Consciousness: awake, alert , oriented and patient cooperative  Airway & Oxygen Therapy: Patient Spontanous Breathing and Patient connected to nasal cannula oxygen  Post-op Assessment: Report given to RN and Post -op Vital signs reviewed and stable  Post vital signs: Reviewed and stable  Last Vitals:  Filed Vitals:   03/28/16 0912 03/28/16 0915  BP:  135/62  Pulse: 52 62  Temp:    Resp:  17    Last Pain: There were no vitals filed for this visit.    Patients Stated Pain Goal: 4 ([id number])  Complications: No apparent anesthesia complications

## 2016-03-28 NOTE — Anesthesia Postprocedure Evaluation (Signed)
Anesthesia Post Note  Patient: [name]  Procedure(s) Performed: Procedure(s) (LRB): IRRIGATION AND DEBRIDEMENT RIGHT CHEST WALL WOUND WITH A Cell Placement (Right)  Patient location during evaluation: PACU Anesthesia Type: General Level of consciousness: awake Pain management: pain level controlled Vital Signs Assessment: post-procedure vital signs reviewed and stable Anesthetic complications: no    Last Vitals:  Filed Vitals:   03/28/16 0912 03/28/16 0915  BP:  135/62  Pulse: 52 62  Temp: 36.7 C   Resp:  17    Last Pain:  Filed Vitals:   03/28/16 0925  PainSc: 0-No pain                 EDWARDS,Chenelle Benning

## 2016-03-28 NOTE — Discharge Instructions (Signed)
[place] gel daily to the wound starting tomorrow.

## 2016-03-28 NOTE — Interval H&P Note (Signed)
History and Physical Interval Note:  03/28/2016 7:33 AM  [name]  has presented today for surgery, with the diagnosis of right chest wall wound  The various methods of treatment have been discussed with the patient and family. After consideration of risks, benefits and other options for treatment, the patient has consented to  Procedure(s): IRRIGATION AND DEBRIDEMENT RIGHT CHEST WALL WOUND WITH A CELL AND POSSIBLE GRAFT (Right) as a surgical intervention .  The patient's history has been reviewed, patient examined, no change in status, stable for surgery.  I have reviewed the patient's chart and labs.  Questions were answered to the patient's satisfaction.     Wallace Going

## 2016-03-28 NOTE — Progress Notes (Signed)
Pt went to ct with ct transport

## 2016-03-28 NOTE — Brief Op Note (Signed)
03/28/2016  8:18 AM  PATIENT:  [name]  81 y.o. female  PRE-OPERATIVE DIAGNOSIS:  right chest wall wound  POST-OPERATIVE DIAGNOSIS:  right chest wall wound  PROCEDURE:  Procedure(s): IRRIGATION AND DEBRIDEMENT RIGHT CHEST WALL WOUND WITH A CELL AND POSSIBLE GRAFT (Right)  SURGEON:  Surgeon(s) and Role:    * Claire S Dillingham, DO - Primary  PHYSICIAN ASSISTANT: none  ASSISTANTS: none   ANESTHESIA:   general  EBL:     BLOOD ADMINISTERED:none  DRAINS: none   LOCAL MEDICATIONS USED:  NONE  SPECIMEN:  Source of Specimen:  bone from sternal area  DISPOSITION OF SPECIMEN:  micro  COUNTS:  YES  TOURNIQUET:  * No tourniquets in log *  DICTATION: .Dragon Dictation  PLAN OF CARE: Discharge to home after PACU  PATIENT DISPOSITION:  PACU - hemodynamically stable.   Delay start of Pharmacological VTE agent (>24hrs) due to surgical blood loss or risk of bleeding: no

## 2016-03-28 NOTE — Anesthesia Preprocedure Evaluation (Addendum)
Anesthesia Evaluation  Patient identified by MRN, date of birth, ID band Patient awake    Reviewed: Allergy & Precautions, NPO status , Patient's Chart, lab work & pertinent test results  History of Anesthesia Complications (+) PONV  Airway Mallampati: II  TM Distance: >3 FB Neck ROM: Full    Dental   Pulmonary pneumonia, COPD, former smoker,    breath sounds clear to auscultation       Cardiovascular hypertension, + CAD  + dysrhythmias + pacemaker  Rhythm:Regular Rate:Normal     Neuro/Psych    GI/Hepatic Neg liver ROS, GERD  ,  Endo/Other  diabetesHypothyroidism   Renal/GU negative Renal ROS     Musculoskeletal   Abdominal   Peds  Hematology   Anesthesia Other Findings   Reproductive/Obstetrics                            Anesthesia Physical Anesthesia Plan  ASA: III  Anesthesia Plan: General   Post-op Pain Management:    Induction: Intravenous  Airway Management Planned:   Additional Equipment:   Intra-op Plan:   Post-operative Plan: Extubation in OR  Informed Consent: I have reviewed the patients History and Physical, chart, labs and discussed the procedure including the risks, benefits and alternatives for the proposed anesthesia with the patient or authorized representative who has indicated his/her understanding and acceptance.   Dental advisory given  Plan Discussed with: CRNA and Anesthesiologist  Anesthesia Plan Comments:         Anesthesia Quick Evaluation

## 2016-03-29 ENCOUNTER — Encounter (HOSPITAL_COMMUNITY): Payer: Self-pay | Admitting: Plastic Surgery

## 2016-03-29 LAB — ACID FAST SMEAR (AFB): ACID FAST SMEAR - AFSCU2: NEGATIVE

## 2016-03-29 LAB — ACID FAST SMEAR (AFB, MYCOBACTERIA)

## 2016-04-02 LAB — AEROBIC/ANAEROBIC CULTURE (SURGICAL/DEEP WOUND)

## 2016-04-02 LAB — AEROBIC/ANAEROBIC CULTURE W GRAM STAIN (SURGICAL/DEEP WOUND): Culture: NO GROWTH

## 2016-04-03 ENCOUNTER — Encounter: Payer: Self-pay | Admitting: *Deleted

## 2016-04-03 ENCOUNTER — Ambulatory Visit (INDEPENDENT_AMBULATORY_CARE_PROVIDER_SITE_OTHER): Payer: Medicare Other

## 2016-04-03 DIAGNOSIS — Z952 Presence of prosthetic heart valve: Secondary | ICD-10-CM

## 2016-04-03 DIAGNOSIS — Z953 Presence of xenogenic heart valve: Secondary | ICD-10-CM

## 2016-04-03 DIAGNOSIS — I340 Nonrheumatic mitral (valve) insufficiency: Secondary | ICD-10-CM | POA: Diagnosis not present

## 2016-04-03 DIAGNOSIS — I4891 Unspecified atrial fibrillation: Secondary | ICD-10-CM | POA: Diagnosis not present

## 2016-04-03 DIAGNOSIS — Z951 Presence of aortocoronary bypass graft: Secondary | ICD-10-CM

## 2016-04-03 LAB — POCT INR: INR: 1.3

## 2016-04-05 ENCOUNTER — Ambulatory Visit (INDEPENDENT_AMBULATORY_CARE_PROVIDER_SITE_OTHER): Payer: Medicare Other | Admitting: Cardiovascular Disease

## 2016-04-05 ENCOUNTER — Encounter: Payer: Self-pay | Admitting: Cardiovascular Disease

## 2016-04-05 VITALS — BP 110/58 | HR 64 | Ht 61.5 in | Wt 132.0 lb

## 2016-04-05 DIAGNOSIS — I2510 Atherosclerotic heart disease of native coronary artery without angina pectoris: Secondary | ICD-10-CM

## 2016-04-05 DIAGNOSIS — I4891 Unspecified atrial fibrillation: Secondary | ICD-10-CM

## 2016-04-05 DIAGNOSIS — I5032 Chronic diastolic (congestive) heart failure: Secondary | ICD-10-CM

## 2016-04-05 DIAGNOSIS — I059 Rheumatic mitral valve disease, unspecified: Secondary | ICD-10-CM

## 2016-04-05 NOTE — Progress Notes (Signed)
Cardiology Office Note   Date:  04/05/2016   ID:  [name], DOB Jun 24, 1935, MRN [id number]  PCP:  Ezequiel Kayser, MD  Cardiologist:   Kathlyn Sacramento, MD   Chief Complaint  Patient presents with  . Other    3 month follow up. Meds reviewed by the patient verbally. "doing well."       History of Present Illness: [name] is a 81 y.o. female who presents for  a follow-up visit regarding chronic systolic heart failure, atrial fibrillation and mitral regurgitation.  She has known history of COPD, breast cancer status post right partial mastectomy, hypothyroidism, hyperlipidemia and Mnire disease.   She underwent mitral valve replacement with a bioprosthetic valve, tricuspid valve repair, one-vessel CABG with LIMA to LAD and maze procedure in August 2016. This was complicated by sternal wound infection which required debridement. She also had complete heart block and underwent permanent pacemaker placement. Echocardiogram in December 2016 showed worsening of LV systolic function with an EF of 30-35%. Ejection fraction before surgery was 50%. The mitral valve appeared intact with no significant regurgitation and minimal gradient. There was also resolution of pulmonary hypertension. I referred her to Dr. Caryl Comes who reprogrammed her pacemaker to improve intrinsic AV conduction. The patient reports immediate improvement in symptoms after making adjustments.  She continues to do with recurrent sternal wound infection currently followed by plastic surgery . She has been stable from a cardiac standpoint with no chest pain. She does complain of increased fatigue and exertional dyspnea without orthopnea, PND or leg edema. She had labs done last week which showed normal renal function and electrolytes. She was noted to be anemic with a hemoglobin of 8.7. She does have chronic anemia but this is worse than her baseline. She denies any active bleeding.  Past Medical History:  Diagnosis Date  .  Anxiety   . Arthritis    "hands" (11/10/2015)  . CAD s/p CABG x 1    a. 04/2015 LIMA to LAD  . Cancer of right breast ([place]) 08/09/2001  . Chronic diastolic CHF (congestive heart failure) ([place])   . COPD (chronic obstructive pulmonary disease) ([place])   . COPD (chronic obstructive pulmonary disease) ([place])   . GERD (gastroesophageal reflux disease)   . Hyperlipidemia   . Hypertension   . Hypothyroidism   . 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 04/2015 Complete bilateral atrial lesion set using cryothermy and bipolar radiofrequency ablation with clipping of LA appendage  . Meniere disease   . On home oxygen therapy    "2L at night" (11/10/2015)  . PAF (paroxysmal atrial fibrillation) ([place])   . Pneumonia ~ 2010  . PONV (postoperative nausea and vomiting)   . Post-surgical complete heart block, symptomatic    a. 04/2015 s/p MDT [id number] Advisa DR MRI DC PPM.  . Presence of permanent cardiac pacemaker   . Pulmonary hypertension ([place])   . Severe Mitral Regurgitation s/p MV Repair    a. 04/2015 s/p 27 mm [facility] Mitral bovine bioprosthetic tissue valve  . Surgical wound, non healing - chest wall 11/10/2015  . Tricuspid Regurgitation s/p Repair    a. 04/2015 s/p 26 mm Edwards mc3 ring annuloplasty  . Wound infection ([place]) 10/13/2015   Superficial sternal wound infection    Past Surgical History:  Procedure Laterality Date  . APPLICATION OF A-CELL OF CHEST/ABDOMEN N/A 11/21/2015   Procedure: APPLICATION OF A-CELL OF CHEST/ABDOMEN;  Surgeon: Loel Lofty Dillingham, DO;  Location: [place];  Service: Plastics;  Laterality: N/A;  . APPLICATION OF A-CELL OF CHEST/ABDOMEN Right 12/21/2015   Procedure: APPLICATION OF A-CELL OF RIGHT CHEST;  Surgeon: Loel Lofty Dillingham, DO;  Location: [place];  Service: Plastics;  Laterality: Right;  . APPLICATION OF A-CELL OF CHEST/ABDOMEN Right 01/11/2016   Procedure: APPLICATION OF A-CELL OF RIGHT CHEST;  Surgeon: Loel Lofty Dillingham, DO;  Location: [place];  Service:  Plastics;  Laterality: Right;  . APPLICATION OF A-CELL OF CHEST/ABDOMEN Right 02/12/2016   Procedure: APPLICATION OF A-CELL TO RIGHT CHEST WOUND;  Surgeon: Loel Lofty Dillingham, DO;  Location: [place];  Service: Plastics;  Laterality: Right;  . APPLICATION OF WOUND VAC N/A 05/09/2015   Procedure: APPLICATION OF WOUND VAC;  Surgeon: Rexene Alberts, MD;  Location: [place];  Service: Thoracic;  Laterality: N/A;  . APPLICATION OF WOUND VAC N/A 11/13/2015   Procedure: APPLICATION OF WOUND VAC;  Surgeon: Rexene Alberts, MD;  Location: [place];  Service: Thoracic;  Laterality: N/A;  . APPLICATION OF WOUND VAC N/A 11/21/2015   Procedure: APPLICATION OF WOUND VAC;  Surgeon: Loel Lofty Dillingham, DO;  Location: [place];  Service: Plastics;  Laterality: N/A;  . APPLICATION OF WOUND VAC Right 12/21/2015   Procedure: APPLICATION OF WOUND VAC to right chest wall ;  Surgeon: Loel Lofty Dillingham, DO;  Location: [place];  Service: Plastics;  Laterality: Right;  . APPLICATION OF WOUND VAC Right 01/11/2016   Procedure: APPLICATION OF WOUND VAC RIGHT CHEST ;  Surgeon: Loel Lofty Dillingham, DO;  Location: [place];  Service: Plastics;  Laterality: Right;  . APPLICATION OF WOUND VAC Right 01/24/2016   Procedure: APPLICATION OF WOUND VAC RIGHT CHEST WALL;  Surgeon: Loel Lofty Dillingham, DO;  Location: [place];  Service: Plastics;  Laterality: Right;  . APPLICATION OF WOUND VAC Right 02/12/2016   Procedure: RE-APPLICATION OF WOUND VAC TO RIGHT CHEST WOUND;  Surgeon: Loel Lofty Dillingham, DO;  Location: [place];  Service: Plastics;  Laterality: Right;  . BREAST BIOPSY Left 11/25/06   neg  . BREAST BIOPSY Left 01/20/12   /clip-neg  . CARDIAC CATHETERIZATION  11/2013   [facility]  . CARDIAC CATHETERIZATION  10/2014   [facility]  . CARDIAC VALVE REPLACEMENT    . CATARACT EXTRACTION W/ INTRAOCULAR LENS  IMPLANT, BILATERAL Bilateral 2014  . CLIPPING OF ATRIAL APPENDAGE N/A 04/25/2015   Procedure: CLIPPING OF ATRIAL APPENDAGE;  Surgeon: Rexene Alberts, MD;   Location: [place];  Service: Open Heart Surgery;  Laterality: N/A;  . COCHLEAR IMPLANT Left 2005?  . CORONARY ARTERY BYPASS GRAFT N/A 04/25/2015   Procedure: CORONARY ARTERY BYPASS GRAFTING (CABG) x ONE, using left internal mammary artery;  Surgeon: Rexene Alberts, MD;  Location: [place];  Service: Open Heart Surgery;  Laterality: N/A;  . DILATION AND CURETTAGE OF UTERUS  "several before hysterectomy"  . EP IMPLANTABLE DEVICE N/A 05/02/2015   Procedure: Pacemaker Implant;  Surgeon: Thompson Grayer, MD;  Location: [place] [facility];  Service: Cardiovascular;  Laterality: N/A;  . I&D EXTREMITY Right 12/06/2015   Procedure: IRRIGATION AND DEBRIDEMENT RIGHT CHEST WALL WITH ACELL PLACEMENT AND VAC;  Surgeon: Loel Lofty Dillingham, DO;  Location: [place];  Service: Plastics;  Laterality: Right;  . INCISION AND DRAINAGE OF WOUND N/A 11/21/2015   Procedure: IRRIGATION AND DEBRIDEMENT WOUND;  Surgeon: Loel Lofty Dillingham, DO;  Location: [place];  Service: Plastics;  Laterality: N/A;  . INCISION AND DRAINAGE OF WOUND Right 12/21/2015  Procedure: IRRIGATION AND DEBRIDEMENT right chest wall WOUND;  Surgeon: Loel Lofty Dillingham, DO;  Location: [place];  Service: Plastics;  Laterality: Right;  right chest wall   . INCISION AND DRAINAGE OF WOUND Right 01/24/2016   Procedure: IRRIGATION AND DEBRIDEMENT RIGHT CHEST WALL WOUND;  Surgeon: Loel Lofty Dillingham, DO;  Location: [place];  Service: Plastics;  Laterality: Right;  . INCISION AND DRAINAGE OF WOUND Right 03/28/2016   Procedure: IRRIGATION AND DEBRIDEMENT RIGHT CHEST WALL WOUND WITH A Cell Placement;  Surgeon: Loel Lofty Dillingham, DO;  Location: [place];  Service: Plastics;  Laterality: Right;  . INSERT / REPLACE / REMOVE PACEMAKER    . IRRIGATION AND DEBRIDEMENT OF WOUND WITH SPLIT THICKNESS SKIN GRAFT Right 01/11/2016   Procedure: IRRIGATION AND DEBRIDEMENT OF RIGHT CHEST WOUND ;  Surgeon: Loel Lofty Dillingham, DO;  Location: [place];  Service: Plastics;  Laterality: Right;  .  MASTECTOMY, PARTIAL Right 2002  . MAZE N/A 04/25/2015   Procedure: MAZE;  Surgeon: Rexene Alberts, MD;  Location: [place];  Service: Open Heart Surgery;  Laterality: N/A;  . MITRAL VALVE REPAIR N/A 04/25/2015   Procedure: MITRAL VALVE  REPLACEMENT using a 27 mm Edwards Perimount Magna Mitral Ease Valve;  Surgeon: Rexene Alberts, MD;  Location: [place];  Service: Open Heart Surgery;  Laterality: N/A;  . SKIN SPLIT GRAFT Right 02/12/2016   Procedure: IRRIGATION AND DEBRIDEMENT RIGHT CHEST WOUND;  Surgeon: Loel Lofty Dillingham, DO;  Location: [place];  Service: Plastics;  Laterality: Right;  . STERNAL WIRES REMOVAL N/A 06/05/2015   Procedure: STERNAL WIRES REMOVAL;  Surgeon: Rexene Alberts, MD;  Location: [place];  Service: Thoracic;  Laterality: N/A;  . STERNAL WOUND DEBRIDEMENT N/A 05/09/2015   Procedure: STERNAL WOUND DEBRIDEMENT;  Surgeon: Rexene Alberts, MD;  Location: [place];  Service: Thoracic;  Laterality: N/A;  . STERNAL WOUND DEBRIDEMENT N/A 11/13/2015   Procedure: Excisional drainage of RIGHT Chest wall mass and breast mass ;  Surgeon: Rexene Alberts, MD;  Location: [place];  Service: Thoracic;  Laterality: N/A;  . TEE WITHOUT CARDIOVERSION N/A 04/25/2015   Procedure: TRANSESOPHAGEAL ECHOCARDIOGRAM (TEE);  Surgeon: Rexene Alberts, MD;  Location: [place];  Service: Open Heart Surgery;  Laterality: N/A;  . TONSILLECTOMY    . TRAM Right 11/18/2015   Procedure: VRAM Vertical Rectus Abdominus Muscle Flap;  Surgeon: Loel Lofty Dillingham, DO;  Location: [place];  Service: Plastics;  Laterality: Right;  RIght Back  . TRICUSPID VALVE REPLACEMENT N/A 04/25/2015   Procedure: TRICUSPID VALVE REPAIR;  Surgeon: Rexene Alberts, MD;  Location: [place];  Service: Open Heart Surgery;  Laterality: N/A;  . VAGINAL HYSTERECTOMY       Current Outpatient Prescriptions  Medication Sig Dispense Refill  . acetaminophen (TYLENOL) 325 MG tablet Take 2 tablets (650 mg total) by mouth every 4 (four) hours as needed for mild pain (or  Fever >/= 101).    Marland Kitchen aspirin EC 81 MG EC tablet Take 1 tablet (81 mg total) by mouth daily.    . bisoprolol (ZEBETA) 10 MG tablet Take 1 tablet (10 mg total) by mouth daily. 30 tablet 5  . fluticasone (FLONASE) 50 MCG/ACT nasal spray Place 1 spray into both nostrils daily as needed for allergies.     . furosemide (LASIX) 20 MG tablet Take 20 mg by mouth 2 (two) times daily.     Marland Kitchen gabapentin (NEURONTIN) 100 MG capsule Take 100 mg by mouth at bedtime.  1  . ipratropium-albuterol (DUONEB) 0.5-2.5 (3) MG/3ML SOLN Take 3 mLs by nebulization every 6 (six) hours as needed (for shortness of breath). 360 mL 10  . levothyroxine (SYNTHROID, LEVOTHROID) 88 MCG tablet Take 88 mcg by mouth daily before breakfast.    . losartan (COZAAR) 100 MG tablet Take 1 tablet (100 mg total) by mouth daily. 30 tablet 3  . Multiple Vitamins-Minerals (PRESERVISION AREDS 2 PO) Take 2 tablets by mouth daily.    . pantoprazole (PROTONIX) 40 MG tablet Take 40 mg by mouth 2 (two) times daily.     . potassium chloride SA (K-DUR,KLOR-CON) 20 MEQ tablet Take 20 mEq by mouth daily.  2  . simvastatin (ZOCOR) 20 MG tablet Take 20 mg by mouth every evening.     Marland Kitchen spironolactone (ALDACTONE) 25 MG tablet Take 1 tablet (25 mg total) by mouth daily. 30 tablet 6  . traMADol (ULTRAM) 50 MG tablet Take 1 tablet (50 mg total) by mouth every 6 (six) hours as needed for moderate pain or severe pain. 30 tablet 0  . warfarin (COUMADIN) 2.5 MG tablet TAKE 1 TABLET BY MOUTH AS DIRECTED 40 tablet 2   No current facility-administered medications for this visit.     Allergies:   Ace inhibitors; Augmentin [amoxicillin-pot clavulanate]; Penicillins; Nifedipine; Propranolol; Diazepam; Meperidine; and Propoxyphene    Social History:  The patient  reports that she quit smoking about 17 years ago. Her smoking use included Cigarettes. She has a 60.00 pack-year smoking history. She has never used smokeless tobacco. She reports that she drinks alcohol. She  reports that she does not use drugs.   Family History:  The patient's family history includes Heart attack in her paternal uncle; Heart disease in her brother and father.    ROS:  Please see the history of present illness.   Otherwise, review of systems are positive for none.   All other systems are reviewed and negative.    PHYSICAL EXAM: VS:  BP (!) 110/58 (BP Location: Left Arm, Patient Position: Sitting, Cuff Size: Normal)   Pulse 64   Ht 5' 1.5" (1.562 m)   Wt 132 lb (59.9 kg)   BMI 24.54 kg/m  , BMI Body mass index is 24.54 kg/m. GEN: Well nourished, well developed, in no acute distress  HEENT: normal  Neck: no JVD, carotid bruits, or masses Cardiac: RRR; no murmurs, rubs, or gallops,no edema  Respiratory:  clear to auscultation bilaterally, normal work of breathing GI: soft, nontender, nondistended, + BS MS: no deformity or atrophy  Skin: warm and dry, no rash Neuro:  Strength and sensation are intact Psych: euthymic mood, full affect   EKG:  EKG is ordered today. The ekg ordered today demonstrates atrial paced rhythm , incomplete right bundle branch block, septal infarct with nonspecific T wave changes.  Recent Labs: 04/26/2015: Magnesium 2.5 05/01/2015: TSH 6.238 10/03/2015: BNP CANCELED 11/15/2015: ALT 18 03/28/2016: BUN 18; Creatinine, Ser 0.80; Hemoglobin 8.7; Platelets 267; Potassium 4.5; Sodium 138    Lipid Panel No results found for: CHOL, TRIG, HDL, CHOLHDL, VLDL, LDLCALC, LDLDIRECT    Wt Readings from Last 3 Encounters:  04/05/16 132 lb (59.9 kg)  03/28/16 131 lb (59.4 kg)  03/08/16 131 lb (59.4 kg)       ASSESSMENT AND PLAN:  1.  Chronic systolic heart failure: She is doing reasonably well and appears to be euvolemic. She continues to have significant fatigue and some shortness of breath which is likely multifactorial due to her lung disease,  physical deconditioning and multiple surgeries that she had to go through over the last year.  She is on  optimal medical therapy and I made no changes in her medications. Once her sternal wound heals completely, I plan on repeating her echocardiogram.  2. Status post mitral valve replacement with a bioprosthetic valve: This was intact on most recent echocardiogram.  3. Coronary artery disease status post one-vessel LIMA to LAD: No anginal symptoms  4. Persistent atrial fibrillation: No evidence of recurrent atrial fibrillation since her surgery and even after stopping amiodarone. Most recent labs showed anemia with hemoglobin of 8.7. No obvious signs of bleeding that we should consider stopping anticoagulation.    5. Anemia: I advised her to follow-up with her primary care physician for further investigation of this and treatment. She might be iron deficient.   Disposition:   FU with me in 3 months  Signed,  Kathlyn Sacramento, MD  04/05/2016 10:23 AM    [place]

## 2016-04-05 NOTE — Patient Instructions (Signed)
Medication Instructions: Continue same medications.   Labwork: None.   Procedures/Testing: None.   Follow-Up: Follow up with Dr. Fletcher Anon in 3 months.   Follow up with Dr. Raechel Ache regarding anemia.   Any Additional Special Instructions Will Be Listed Below (If Applicable).     If you need a refill on your cardiac medications before your next appointment, please call your pharmacy.

## 2016-04-10 ENCOUNTER — Ambulatory Visit (INDEPENDENT_AMBULATORY_CARE_PROVIDER_SITE_OTHER): Payer: Medicare Other

## 2016-04-10 DIAGNOSIS — I340 Nonrheumatic mitral (valve) insufficiency: Secondary | ICD-10-CM | POA: Diagnosis not present

## 2016-04-10 DIAGNOSIS — Z953 Presence of xenogenic heart valve: Secondary | ICD-10-CM

## 2016-04-10 DIAGNOSIS — I4891 Unspecified atrial fibrillation: Secondary | ICD-10-CM | POA: Diagnosis not present

## 2016-04-10 LAB — POCT INR: INR: 2

## 2016-04-22 ENCOUNTER — Ambulatory Visit (INDEPENDENT_AMBULATORY_CARE_PROVIDER_SITE_OTHER): Payer: Medicare Other | Admitting: Thoracic Surgery (Cardiothoracic Vascular Surgery)

## 2016-04-22 ENCOUNTER — Encounter: Payer: Self-pay | Admitting: Thoracic Surgery (Cardiothoracic Vascular Surgery)

## 2016-04-22 VITALS — BP 114/59 | HR 83 | Resp 16 | Ht 61.0 in | Wt 130.0 lb

## 2016-04-22 DIAGNOSIS — Z953 Presence of xenogenic heart valve: Secondary | ICD-10-CM

## 2016-04-22 DIAGNOSIS — I2510 Atherosclerotic heart disease of native coronary artery without angina pectoris: Secondary | ICD-10-CM

## 2016-04-22 DIAGNOSIS — L089 Local infection of the skin and subcutaneous tissue, unspecified: Secondary | ICD-10-CM

## 2016-04-22 DIAGNOSIS — T148 Other injury of unspecified body region: Secondary | ICD-10-CM | POA: Diagnosis not present

## 2016-04-22 DIAGNOSIS — T148XXA Other injury of unspecified body region, initial encounter: Secondary | ICD-10-CM

## 2016-04-22 DIAGNOSIS — T8189XD Other complications of procedures, not elsewhere classified, subsequent encounter: Secondary | ICD-10-CM | POA: Diagnosis not present

## 2016-04-22 NOTE — Progress Notes (Signed)
[address]       [place] [address]             [phone]     CARDIOTHORACIC SURGERY OFFICE NOTE  Referring Provider is Dillingham, Loel Lofty, DO Primary Cardiologist is Wellington Hampshire, MD PCP is Ezequiel Kayser, MD   HPI:  Patient is a 81 year old female with multiple medical problems who underwent mitral valve replacement using a bioprosthetic tissue valve, tricuspid valve repair, coronary artery bypass grafting 1 using the LIMA to the LAD, and Maze procedure on 04/25/2015 for rheumatic and degenerative mitral valve disease with severe symptomatic primary mitral regurgitation, tricuspid regurgitation, coronary artery disease, and recurrent persistent atrial fibrillation.  Early after surgery she developed sternal drainage suggestive of localized superficial wound infection. Cultures have always been sterile and never grown any bacteria, fungus or acid-fast bacilli.  She underwent wound debridement with removal of several sternal wires and was treated with a wound VAC. The wound appeared to have healed completely but several months later the patient returned with a draining sinus. She underwent repeat surgical debridement and was found to have a large amount of necrotic cartilage extending along the right chest wall in the vicinity of her right breast where she had had radiation therapy in the distant past.  This required more radical debridement. The patient was evaluated by Dr. Marla Roe and underwent latissimus dorsi myocutaneous flap coverage. The skin became necrotic early postoperatively but the muscle survived. The patient has been followed carefully ever since by Dr. Marla Roe.  Repeat surgical debridement has been required on numerous occasions. Recently the patient was evaluated in the operating room and a tract of purulent drainage was noted. Again cultures were sent and have remained no growth. The patient underwent CT scan of the chest and abdomen and was  referred to our office for follow-up.  The patient returns to our office with her husband present. He continues to manage local wound care. Clinically the patient has been doing well. She denies any fevers, chills, or night sweats. Appetite is good. Weight is stable. Her exercise tolerance has made considerable progress over the past 6 months. She still gets tired and winded with more strenuous exertion but overall she has been doing well. She has been seen in follow-up recently by Dr. Fletcher Anon and she is scheduled for follow-up with Dr. Caryl Comes in the near future. She has not had problems related to congestive heart failure for several months.  She has not had a follow-up echocardiogram since last December.   Current Outpatient Prescriptions  Medication Sig Dispense Refill  . acetaminophen (TYLENOL) 325 MG tablet Take 2 tablets (650 mg total) by mouth every 4 (four) hours as needed for mild pain (or Fever >/= 101).    Marland Kitchen aspirin EC 81 MG EC tablet Take 1 tablet (81 mg total) by mouth daily.    . bisoprolol (ZEBETA) 10 MG tablet Take 1 tablet (10 mg total) by mouth daily. 30 tablet 5  . fluticasone (FLONASE) 50 MCG/ACT nasal spray Place 1 spray into both nostrils daily as needed for allergies.     . furosemide (LASIX) 20 MG tablet Take 20 mg by mouth 2 (two) times daily.     Marland Kitchen gabapentin (NEURONTIN) 100 MG capsule Take 100 mg by mouth at bedtime.   1  . ipratropium-albuterol (DUONEB) 0.5-2.5 (3) MG/3ML SOLN Take 3 mLs by nebulization every 6 (six) hours as needed (for shortness of breath). 360 mL 10  .  levothyroxine (SYNTHROID, LEVOTHROID) 88 MCG tablet Take 88 mcg by mouth daily before breakfast.    . losartan (COZAAR) 100 MG tablet Take 1 tablet (100 mg total) by mouth daily. 30 tablet 3  . Multiple Vitamins-Minerals (PRESERVISION AREDS 2 PO) Take 2 tablets by mouth daily.    . pantoprazole (PROTONIX) 40 MG tablet Take 40 mg by mouth 2 (two) times daily.     . potassium chloride SA (K-DUR,KLOR-CON) 20  MEQ tablet Take 20 mEq by mouth daily.  2  . simvastatin (ZOCOR) 20 MG tablet Take 20 mg by mouth every evening.     Marland Kitchen spironolactone (ALDACTONE) 25 MG tablet Take 1 tablet (25 mg total) by mouth daily. 30 tablet 6  . warfarin (COUMADIN) 2.5 MG tablet TAKE 1 TABLET BY MOUTH AS DIRECTED 40 tablet 2   No current facility-administered medications for this visit.       Physical Exam:   BP (!) 114/59   Pulse 83   Resp 16   Ht 5\' 1"  (1.549 m)   Wt 130 lb (59 kg)   SpO2 94% Comment: ON RA  BMI 24.56 kg/m   General:  Elderly and somewhat frail but overall well-appearing  Chest:   Clear to auscultation with symmetrical breath sounds  CV:   Regular rate and rhythm without murmur  Incisions:  Small open wound to the right of midline. Healthy granulation tissue. No surrounding cellulitis. Soft tissue swab can be passed approximately 2.5 cm towards the right, corresponding to the deep subcutaneous tissues overlying the right costal margin. Some bloody drainage can be elicited.  Very mild tenderness across the right costal margin. No fluctuance. No skin changes.  Abdomen:  Soft and nontender  Extremities:  Warm and well-perfused  Diagnostic Tests:  CT CHEST AND ABDOMEN WITH CONTRAST  TECHNIQUE: Multidetector CT imaging of the chest and abdomen was performed following the standard protocol during bolus administration of intravenous contrast.  CONTRAST:  100 cc Isovue 370 IV.  COMPARISON:  11/10/2015 chest CT and 03/17/2015 chest and abdomen CT angiogram.  FINDINGS: CT CHEST FINDINGS  Mediastinum/Nodes: Stable mild cardiomegaly. No significant pericardial fluid/thickening. Left main, left anterior descending, left circumflex and right coronary atherosclerosis status post CABG with left internal mammary bypass graft. Two lead left subclavian pacemaker with lead tips in the right atrium and right ventricular apex. Retained epicardial pacer leads in the anterior/ lower pericardial  space. Pulmonic and mitral valve prostheses are in place. Atherosclerotic nonaneurysmal thoracic aorta. Top-normal caliber pulmonary arteries. No central pulmonary emboli. No discrete thyroid nodules. Unremarkable esophagus. No pathologically enlarged axillary, mediastinal or hilar lymph nodes. Coarsely calcified subcarinal and right hilar nodes are unchanged, from prior granulomatous disease.  Lungs/Pleura: No pneumothorax. No pleural effusion. Moderate centrilobular emphysema with mild diffuse bronchial wall thickening. Stable coarsely calcified 10 mm right upper lobe granuloma. Left upper lobe 3 mm solid pulmonary nodule (series 3/ image 47) is stable back to 03/17/2015 and probably benign. No acute consolidative airspace disease or new significant pulmonary nodules.  Musculoskeletal: There is a superficial wound in the medial ventral right parasternal chest wall with overlying dressing. There is skin thickening throughout the right breast. There is subcutaneous fat stranding and scattered mild subcutaneous emphysema in the ventral lower right chest wall near the wound site. There is prominent thickening of the right sixth chondral cartilage (series 2/ image 46), increased in the interval. No focal superficial fluid collections. No aggressive appearing focal osseous lesions. Sternotomy wires appear aligned and intact. Mild  thoracic spondylosis.  CT ABDOMEN FINDINGS  Hepatobiliary: Normal liver size. Granulomatous calcification in the right liver lobe. No liver mass. Normal gallbladder with no radiopaque cholelithiasis. No biliary ductal dilatation.  Pancreas: Normal, with no mass or duct dilation.  Spleen: Normal size spleen. Scattered granulomatous calcifications throughout the spleen. No splenic mass.  Adrenals/Urinary Tract: Normal adrenals. No hydronephrosis. Subcentimeter hypodense renal cortical lesion in the interpolar left kidney is too small to characterize  and unchanged back to 03/17/2015, suggesting a benign renal cyst.  Stomach/Bowel: Grossly normal stomach. Visualized small and large bowel is normal caliber, with no bowel wall thickening.  Vascular/Lymphatic: Atherosclerotic nonaneurysmal abdominal aorta. Patent portal, splenic, hepatic and renal veins. No pathologically enlarged lymph nodes in the abdomen.  Other: No pneumoperitoneum, ascites or focal fluid collection.  Musculoskeletal: No aggressive appearing focal osseous lesions. Mild lumbar spondylosis.  IMPRESSION: 1. Right lower parasternal superficial wound. Skin thickening, subcutaneous fat stranding and mild subcutaneous emphysema, consistent with postsurgical change and/or cellulitis. Increased thickening of the right sixth chondral cartilage, suggesting chondritis. No fluid collections. No pleural or pericardial effusion. 2. No acute abnormality in the abdomen.  No abdominal ascites. 3. Additional findings include cardiomegaly, coronary atherosclerosis, aortic atherosclerosis and moderate emphysema.   Electronically Signed   By: Ilona Sorrel M.D.   On: 03/28/2016 12:16   Impression:  Patient has small residual open wound with draining sinus that has never completely healed. She has no fevers, chills, sweats, nor other constitutional signs of smoldering infection. All cultures have remained no growth since the patient's original surgery nearly 1 year ago.  The sternum itself has healed completely and there is no sign of deep sternal infection or mediastinitis.  I have personally reviewed the patient's recent CT scan of the chest. There is severe thickening with inflammation surrounding the costal cartilage just to the right of the patient's existing open wound. There does not appear to be any sign of retained foreign body. There are no residual sternal wires in the vicinity of the patient's residual open wound and draining sinus.  The patient does have some pacing  wires that remain along the acute margin of the heart, but this is nowhere near the patient's current wound or signs of inflammation noted on CT scan. The appearance of the cartilage suggests persistent and/or recurrent necrotic cartilage that presumably has poor blood supply secondary to previous radiation therapy. There is no sign of undrained pus or fluid that mandate the need for surgical drainage at this time. The question is whether or not this will heal without another surgical procedure for more radical debridement.  Plan:  I have discussed matters at length with the patient and her husband. We directly reviewed her recent CT scan and discussed the findings. Treatment options were discussed including continued wound care under the direction of Dr. Marla Roe with hopes that this will eventually heal without need for further surgical intervention. Alternatively, radical debridement of the presumably necrotic and/or devitalized cartilage could be entertained. There may be limited options for subsequent soft tissue coverage such as rectus abdominis muscle flap. The pectoralis major muscle could potentially be utilized as the right internal mammary artery presumably remains patent. Given the patient's age and numerous medical problems I would be wary of proceeding with an aggressive surgical approach unless it were clear that conservative measures are not going to work. The patient is not interested in considering surgery at this time. She will continue to follow closely with Dr. Marla Roe. She will return to  our office in approximately one month.  I spent in excess of 30 minutes during the conduct of this office consultation and >50% of this time involved direct face-to-face encounter with the patient for counseling and/or coordination of their care.    Valentina Gu. Roxy Manns, MD 04/22/2016 4:47 PM

## 2016-04-22 NOTE — Patient Instructions (Addendum)
Continue daily dressing changes.  Gently pack deep portion of wound with saline-moistened sterile guaze.  Continue to follow up closely with Dr Marla Roe  Endocarditis is a potentially serious infection of heart valves or inside lining of the heart.  It occurs more commonly in patients with diseased heart valves (such as patient's with aortic or mitral valve disease) and in patients who have undergone heart valve repair or replacement.  Certain surgical and dental procedures may put you at risk, such as dental cleaning, other dental procedures, or any surgery involving the respiratory, urinary, gastrointestinal tract, gallbladder or prostate gland.   To minimize your chances for develooping endocarditis, maintain good oral health and seek prompt medical attention for any infections involving the mouth, teeth, gums, skin or urinary tract.    Always notify your doctor or dentist about your underlying heart valve condition before having any invasive procedures. You will need to take antibiotics before certain procedures, including all routine dental cleanings or other dental procedures.  Your cardiologist or dentist should prescribe these antibiotics for you to be taken ahead of time.

## 2016-04-24 ENCOUNTER — Ambulatory Visit (INDEPENDENT_AMBULATORY_CARE_PROVIDER_SITE_OTHER): Payer: Medicare Other

## 2016-04-24 ENCOUNTER — Encounter: Payer: Self-pay | Admitting: *Deleted

## 2016-04-24 DIAGNOSIS — I340 Nonrheumatic mitral (valve) insufficiency: Secondary | ICD-10-CM

## 2016-04-24 DIAGNOSIS — I4891 Unspecified atrial fibrillation: Secondary | ICD-10-CM

## 2016-04-24 DIAGNOSIS — Z951 Presence of aortocoronary bypass graft: Secondary | ICD-10-CM

## 2016-04-24 DIAGNOSIS — Z953 Presence of xenogenic heart valve: Secondary | ICD-10-CM

## 2016-04-24 DIAGNOSIS — Z952 Presence of prosthetic heart valve: Secondary | ICD-10-CM

## 2016-04-24 LAB — POCT INR: INR: 2

## 2016-04-24 NOTE — Progress Notes (Signed)
Cardiac Individual Treatment Plan  Patient Details  Name: [name] MRN: [id number] Date of Birth: 01/31/1935 Referring Provider:    Initial Encounter Date:  Flowsheet Row Cardiac Rehab from 07/03/2015 in [facility] Cardiac and Pulmonary Rehab  Date  07/03/15      Visit Diagnosis: S/P MVR (mitral valve replacement)  S/P CABG x 1  Patient's Home Medications on Admission:  Current Outpatient Prescriptions:  .  acetaminophen (TYLENOL) 325 MG tablet, Take 2 tablets (650 mg total) by mouth every 4 (four) hours as needed for mild pain (or Fever >/= 101)., Disp: , Rfl:  .  aspirin EC 81 MG EC tablet, Take 1 tablet (81 mg total) by mouth daily., Disp: , Rfl:  .  bisoprolol (ZEBETA) 10 MG tablet, Take 1 tablet (10 mg total) by mouth daily., Disp: 30 tablet, Rfl: 5 .  fluticasone (FLONASE) 50 MCG/ACT nasal spray, Place 1 spray into both nostrils daily as needed for allergies. , Disp: , Rfl:  .  furosemide (LASIX) 20 MG tablet, Take 20 mg by mouth 2 (two) times daily. , Disp: , Rfl:  .  gabapentin (NEURONTIN) 100 MG capsule, Take 100 mg by mouth at bedtime. , Disp: , Rfl: 1 .  ipratropium-albuterol (DUONEB) 0.5-2.5 (3) MG/3ML SOLN, Take 3 mLs by nebulization every 6 (six) hours as needed (for shortness of breath)., Disp: 360 mL, Rfl: 10 .  levothyroxine (SYNTHROID, LEVOTHROID) 88 MCG tablet, Take 88 mcg by mouth daily before breakfast., Disp: , Rfl:  .  losartan (COZAAR) 100 MG tablet, Take 1 tablet (100 mg total) by mouth daily., Disp: 30 tablet, Rfl: 3 .  Multiple Vitamins-Minerals (PRESERVISION AREDS 2 PO), Take 2 tablets by mouth daily., Disp: , Rfl:  .  pantoprazole (PROTONIX) 40 MG tablet, Take 40 mg by mouth 2 (two) times daily. , Disp: , Rfl:  .  potassium chloride SA (K-DUR,KLOR-CON) 20 MEQ tablet, Take 20 mEq by mouth daily., Disp: , Rfl: 2 .  simvastatin (ZOCOR) 20 MG tablet, Take 20 mg by mouth every evening. , Disp: , Rfl:  .  spironolactone (ALDACTONE) 25 MG tablet, Take 1 tablet (25  mg total) by mouth daily., Disp: 30 tablet, Rfl: 6 .  warfarin (COUMADIN) 2.5 MG tablet, TAKE 1 TABLET BY MOUTH AS DIRECTED, Disp: 40 tablet, Rfl: 2  Past Medical History: Past Medical History:  Diagnosis Date  . Anxiety   . Arthritis    "hands" (11/10/2015)  . CAD s/p CABG x 1    a. 04/2015 LIMA to LAD  . Cancer of right breast ([place]) 08/09/2001  . Chronic diastolic CHF (congestive heart failure) ([place])   . COPD (chronic obstructive pulmonary disease) ([place])   . COPD (chronic obstructive pulmonary disease) ([place])   . GERD (gastroesophageal reflux disease)   . Hyperlipidemia   . Hypertension   . Hypothyroidism   . 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 04/2015 Complete bilateral atrial lesion set using cryothermy and bipolar radiofrequency ablation with clipping of LA appendage  . Meniere disease   . On home oxygen therapy    "2L at night" (11/10/2015)  . PAF (paroxysmal atrial fibrillation) ([place])   . Pneumonia ~ 2010  . PONV (postoperative nausea and vomiting)   . Post-surgical complete heart block, symptomatic    a. 04/2015 s/p MDT [id number] Advisa DR MRI DC PPM.  . Presence of permanent cardiac pacemaker   . Pulmonary hypertension ([place])   . Severe Mitral Regurgitation s/p MV Repair  a. 04/2015 s/p 27 mm [facility] Mitral bovine bioprosthetic tissue valve  . Surgical wound, non healing - chest wall 11/10/2015  . Tricuspid Regurgitation s/p Repair    a. 04/2015 s/p 26 mm Edwards mc3 ring annuloplasty  . Wound infection ([place]) 10/13/2015   Superficial sternal wound infection    Tobacco Use: History  Smoking Status  . Former Smoker  . Packs/day: 1.50  . Years: 40.00  . Types: Cigarettes  . Quit date: 04/20/1998  Smokeless Tobacco  . Never Used    Labs: Recent Review Flowsheet Data    Labs for ITP Cardiac and Pulmonary Rehab Latest Ref Rng & Units 04/28/2015 04/29/2015 04/30/2015 11/13/2015 11/14/2015   Hemoglobin A1c 4.8 - 5.6 % - - - - -   PHART 7.350 - 7.450 - - - 7.428 7.420    PCO2ART 35.0 - 45.0 mmHg - - - 41.1 49.4(H)   HCO3 20.0 - 24.0 mEq/L - - - 26.7(H) 31.5(H)   TCO2 0 - 100 mmol/L - 34 34 27.9 33.0   ACIDBASEDEF 0.0 - 2.0 mmol/L - - - - -   O2SAT % 65.3 61.7 - 95.2 98.4       Exercise Target Goals:    Exercise Program Goal: Individual exercise prescription set with THRR, safety & activity barriers. Participant demonstrates ability to understand and report RPE using BORG scale, to self-measure pulse accurately, and to acknowledge the importance of the exercise prescription.  Exercise Prescription Goal: Starting with aerobic activity 30 plus minutes a day, 3 days per week for initial exercise prescription. Provide home exercise prescription and guidelines that participant acknowledges understanding prior to discharge.  Activity Barriers & Risk Stratification:   6 Minute Walk:   Initial Exercise Prescription:   Perform Capillary Blood Glucose checks as needed.  Exercise Prescription Changes:     Exercise Prescription Changes    Row Name 11/20/15 1200             Exercise Review   Progression No  Absent since last review; last visit 08/09/15         Response to Exercise   Comments Ex. Rx. will be re evaluated upon return due to extended absence       Duration Progress to 50 minutes of aerobic without signs/symptoms of physical distress       Intensity Rest + 30         Progression   Progression Continue progressive overload as per policy without signs/symptoms or physical distress.          Exercise Comments:     Exercise Comments    Row Name 12/27/15 0654 01/24/16 [id number] 02/21/16 1504 03/20/16 1623 04/03/16 1436   Exercise Comments The patient has not attended class since the last review. There has been no progress made due to lack of attendance. Workloads may need to be adjusted if the patient returns to class.  The patient has not attended class since the last review. There has been no progress made due to lack of attendance.  Workloads may need to be adjusted if the patient returns to class.  Pt has not returned to rehab since last medical review. Pt has been out since last review. Pt has been out since last review.      Discharge Exercise Prescription (Final Exercise Prescription Changes):     Exercise Prescription Changes - 11/20/15 1200      Exercise Review   Progression No  Absent since last review; last visit 08/09/15  Response to Exercise   Comments Ex. Rx. will be re evaluated upon return due to extended absence   Duration Progress to 50 minutes of aerobic without signs/symptoms of physical distress   Intensity Rest + 30     Progression   Progression Continue progressive overload as per policy without signs/symptoms or physical distress.      Nutrition:  Target Goals: Understanding of nutrition guidelines, daily intake of sodium 1500mg , cholesterol 200mg , calories 30% from fat and 7% or less from saturated fats, daily to have 5 or more servings of fruits and vegetables.  Biometrics:    Nutrition Therapy Plan and Nutrition Goals:   Nutrition Discharge: Rate Your Plate Scores:   Nutrition Goals Re-Evaluation:     Nutrition Goals Re-Evaluation    Row Name 11/27/15 1359             Personal Goal #1 Re-Evaluation   Personal Goal #1 Adlih has been out since 08/09/2015 due to wound debridement. Noted that Raquelle recently had a Irrigation and debridment of her chest wall per another dept [facility] charting.        Goal Progress Seen No          Psychosocial: Target Goals: Acknowledge presence or absence of depression, maximize coping skills, provide positive support system. Participant is able to verbalize types and ability to use techniques and skills needed for reducing stress and depression.  Initial Review & Psychosocial Screening:   Quality of Life Scores:   PHQ-9: Recent Review Flowsheet Data    Depression screen [facility] 2/9 07/03/2015   Decreased Interest 0   Down,  Depressed, Hopeless 0   PHQ - 2 Score 0   Altered sleeping 0   Tired, decreased energy 3   Change in appetite 0   Feeling bad or failure about yourself  0   Trouble concentrating 0   Moving slowly or fidgety/restless 0   Suicidal thoughts 0   PHQ-9 Score 3   Difficult doing work/chores Somewhat difficult      Psychosocial Evaluation and Intervention:   Psychosocial Re-Evaluation:     Psychosocial Re-Evaluation    Row Name 11/13/15 1422 11/27/15 1425           Psychosocial Re-Evaluation   Comments I left vm for Lavona since she called on 11/06/2015 to see if she could return. I read EPIC note from today that mentioned an Infection and Wound debridement so I suggested she return after things get better for her. I called Aniaya's home and her husband answered. He said she just got out of [facility] for another debridment of her chest wall.          Vocational Rehabilitation: Provide vocational rehab assistance to qualifying candidates.   Vocational Rehab Evaluation & Intervention:   Education: Education Goals: Education classes will be provided on a weekly basis, covering required topics. Participant will state understanding/return demonstration of topics presented.  Learning Barriers/Preferences:   Education Topics: General Nutrition Guidelines/Fats and Fiber: -Group instruction provided by verbal, written material, models and posters to present the general guidelines for heart healthy nutrition. Gives an explanation and review of dietary fats and fiber.   Controlling Sodium/Reading Food Labels: -Group verbal and written material supporting the discussion of sodium use in heart healthy nutrition. Review and explanation with models, verbal and written materials for utilization of the food label.   Exercise Physiology & Risk Factors: - Group verbal and written instruction with models to review the exercise physiology of the  cardiovascular system and associated  critical values. Details cardiovascular disease risk factors and the goals associated with each risk factor. Flowsheet Row Cardiac Rehab from 08/09/2015 in [facility] Cardiac and Pulmonary Rehab  Date  07/12/15  Educator  RM  Instruction Review Code  2- meets goals/outcomes      Aerobic Exercise & Resistance Training: - Gives group verbal and written discussion on the health impact of inactivity. On the components of aerobic and resistive training programs and the benefits of this training and how to safely progress through these programs. Flowsheet Row Cardiac Rehab from 08/09/2015 in [facility] Cardiac and Pulmonary Rehab  Date  07/17/15  Educator  RM  Instruction Review Code  2- meets goals/outcomes      Flexibility, Balance, General Exercise Guidelines: - Provides group verbal and written instruction on the benefits of flexibility and balance training programs. Provides general exercise guidelines with specific guidelines to those with heart or lung disease. Demonstration and skill practice provided. Flowsheet Row Cardiac Rehab from 08/09/2015 in [facility] Cardiac and Pulmonary Rehab  Date  07/24/15  Educator  RM  Instruction Review Code  2- meets goals/outcomes      Stress Management: - Provides group verbal and written instruction about the health risks of elevated stress, cause of high stress, and healthy ways to reduce stress. Flowsheet Row Cardiac Rehab from 08/09/2015 in [facility] Cardiac and Pulmonary Rehab  Date  07/26/15  Educator  [facility]  Instruction Review Code  2- meets goals/outcomes      Depression: - Provides group verbal and written instruction on the correlation between heart/lung disease and depressed mood, treatment options, and the stigmas associated with seeking treatment.   Anatomy & Physiology of the Heart: - Group verbal and written instruction and models provide basic cardiac anatomy and physiology, with the coronary electrical and arterial systems. Review of: AMI, Angina,  Valve disease, Heart Failure, Cardiac Arrhythmia, Pacemakers, and the ICD.   Cardiac Procedures: - Group verbal and written instruction and models to describe the testing methods done to diagnose heart disease. Reviews the outcomes of the test results. Describes the treatment choices: Medical Management, Angioplasty, or Coronary Bypass Surgery.   Cardiac Medications: - Group verbal and written instruction to review commonly prescribed medications for heart disease. Reviews the medication, class of the drug, and side effects. Includes the steps to properly store meds and maintain the prescription regimen.   Go Sex-Intimacy & Heart Disease, Get SMART - Goal Setting: - Group verbal and written instruction through game format to discuss heart disease and the return to sexual intimacy. Provides group verbal and written material to discuss and apply goal setting through the application of the S.M.A.R.T. Method.   Other Matters of the Heart: - Provides group verbal, written materials and models to describe Heart Failure, Angina, Valve Disease, and Diabetes in the realm of heart disease. Includes description of the disease process and treatment options available to the cardiac patient. Flowsheet Row Cardiac Rehab from 08/09/2015 in [facility] Cardiac and Pulmonary Rehab  Date  08/09/15  Educator  SB  Instruction Review Code  2- meets goals/outcomes      Exercise & Equipment Safety: - Individual verbal instruction and demonstration of equipment use and safety with use of the equipment. Flowsheet Row Cardiac Rehab from 08/09/2015 in [facility] Cardiac and Pulmonary Rehab  Date  07/03/15  Educator  SB  Instruction Review Code  2- meets goals/outcomes      Infection Prevention: - Provides verbal and written material to individual with  discussion of infection control including proper hand washing and proper equipment cleaning during exercise session. Flowsheet Row Cardiac Rehab from 08/09/2015 in [facility]  Cardiac and Pulmonary Rehab  Date  07/03/15  Educator  Sb  Instruction Review Code  2- meets goals/outcomes      Falls Prevention: - Provides verbal and written material to individual with discussion of falls prevention and safety. Flowsheet Row [facility]iac [facility] Cardiac [facility] Pulmonary Rehab  Date  07/03/15  Educator  Sb  Instruction Review Code  2- meets goals/outcomes      Diabetes: - Individual verbal and written instruction to review signs/symptoms of diabetes, desired ranges of glucose level fasting, after meals and with exercise. Advice that pre and post exercise glucose checks will be done for 3 sessions at entry of program.    Knowledge Questionnaire Score:   Core Components/Risk Factors/Patient Goals at Admission:   Core Components/Risk Factors/Patient Goals Review:      Goals and Risk Factor Review    Row Name 11/13/15 1422 11/27/15 1401 11/27/15 1424         Core Components/Risk Factors/Patient Goals Review   Personal Goals Review  - Sedentary Sedentary     Review I left vm for Meldoy since she called on 11/06/2015 to see if she could return. I read EPIC note from today that mentioned an Infection and Wound debridement so I suggested she return after things get better for her. Alektra has been out since 08/09/2015 due to wound debridement. Noted that [place] recently had a Irrigation and debridment of her chest wall per another dept [facility] charting.  I called Stuart's home and her husband answered. He said she just got out of [facility] for another debridment of her chest wall.      Expected Outcomes  - Clear of infection to be able to exercise.   -        Core Components/Risk Factors/Patient Goals at Discharge (Final Review):      Goals and Risk Factor Review - 11/27/15 1424      Core Components/Risk Factors/Patient Goals Review   Personal Goals Review Sedentary   Review I called Nyeemah's home and her husband answered. He said she just got  out of [facility] for another debridment of her chest wall.       ITP Comments:     ITP Comments    Row Name 10/31/15 0751 11/13/15 1422 11/30/15 1244 12/31/15 0925 01/28/16 1212   ITP Comments 30 day review.  Continue with ITP. Continues to remain out for medical reasons I left vm for Kayelin since she called on 11/06/2015 to see if she could return. I read EPIC note from today that mentioned an Infection and Wound debridement so I suggested she return after things get better for her. 30 Day Review. Continue with the ITP. 30 day review.  Continue with ITP   remains out with medical concerns 30 day review. Continue with ITP. Remains out for medical reason. Hopes to return when wound vac removed and skin graft completed   Row Name 02/21/16 1505 02/28/16 0719 03/20/16 1623 03/27/16 0732 04/03/16 1436   ITP Comments Pt has not returned to rehab since last medical review.  She has had a second wound vac placed 6/5. 30 day review.  Continue with ITP.  [place] remains out for wound care.  Pt has been out since last review. 30 day review. Continue with ITP remains out with medical reason PLAns to return  when given clearance Pt has been out since last review.   [place] Name 04/24/16 0854           ITP Comments Discharged          Comments:

## 2016-04-30 ENCOUNTER — Encounter: Payer: Self-pay | Admitting: Internal Medicine

## 2016-04-30 ENCOUNTER — Ambulatory Visit (INDEPENDENT_AMBULATORY_CARE_PROVIDER_SITE_OTHER): Payer: Medicare Other | Admitting: Internal Medicine

## 2016-04-30 VITALS — BP 130/62 | HR 81 | Ht 61.0 in | Wt 132.0 lb

## 2016-04-30 DIAGNOSIS — Z950 Presence of cardiac pacemaker: Secondary | ICD-10-CM

## 2016-04-30 DIAGNOSIS — I442 Atrioventricular block, complete: Secondary | ICD-10-CM

## 2016-04-30 DIAGNOSIS — I2510 Atherosclerotic heart disease of native coronary artery without angina pectoris: Secondary | ICD-10-CM | POA: Diagnosis not present

## 2016-04-30 DIAGNOSIS — I495 Sick sinus syndrome: Secondary | ICD-10-CM

## 2016-04-30 DIAGNOSIS — I480 Paroxysmal atrial fibrillation: Secondary | ICD-10-CM

## 2016-04-30 DIAGNOSIS — I059 Rheumatic mitral valve disease, unspecified: Secondary | ICD-10-CM

## 2016-04-30 LAB — CUP PACEART INCLINIC DEVICE CHECK
Battery Remaining Longevity: 102 mo
Battery Voltage: 3.02 V
Brady Statistic AP VP Percent: 0.37 %
Brady Statistic RV Percent Paced: 0.44 %
Date Time Interrogation Session: 20170822150742
Implantable Lead Implant Date: 20160823
Implantable Lead Location: 753860
Implantable Lead Model: 5076
Lead Channel Impedance Value: 361 Ohm
Lead Channel Impedance Value: 418 Ohm
Lead Channel Impedance Value: 475 Ohm
Lead Channel Pacing Threshold Amplitude: 0.5 V
Lead Channel Sensing Intrinsic Amplitude: 2.125 mV
Lead Channel Setting Sensing Sensitivity: 2.8 mV
MDC IDC LEAD IMPLANT DT: 20160823
MDC IDC LEAD LOCATION: 753859
MDC IDC MSMT LEADCHNL RA IMPEDANCE VALUE: 437 Ohm
MDC IDC MSMT LEADCHNL RA PACING THRESHOLD PULSEWIDTH: 0.4 ms
MDC IDC MSMT LEADCHNL RV PACING THRESHOLD AMPLITUDE: 0.5 V
MDC IDC MSMT LEADCHNL RV PACING THRESHOLD PULSEWIDTH: 0.4 ms
MDC IDC MSMT LEADCHNL RV SENSING INTR AMPL: 9.5 mV
MDC IDC SET LEADCHNL RA PACING AMPLITUDE: 2 V
MDC IDC SET LEADCHNL RV PACING AMPLITUDE: 2.5 V
MDC IDC SET LEADCHNL RV PACING PULSEWIDTH: 0.4 ms
MDC IDC STAT BRADY AP VS PERCENT: 85.44 %
MDC IDC STAT BRADY AS VP PERCENT: 0.07 %
MDC IDC STAT BRADY AS VS PERCENT: 14.13 %
MDC IDC STAT BRADY RA PERCENT PACED: 85.8 %

## 2016-04-30 NOTE — Patient Instructions (Addendum)
Medication Instructions: - Your physician recommends that you continue on your current medications as directed. Please refer to the Current Medication list given to you today.  Labwork: - none  Procedures/Testing: - none  Follow-Up: - Remote monitoring is used to monitor your Pacemaker of ICD from home. This monitoring reduces the number of office visits required to check your device to one time per year. It allows [place] to keep an eye on the functioning of your device to ensure it is working properly. You are scheduled for a device check from home on 07/30/16. You may send your transmission at any time that day. If you have a wireless device, the transmission will be sent automatically. After your physician reviews your transmission, you will receive a postcard with your next transmission date.  - Your physician wants you to follow-up in: 1 year with Dr. Caryl Comes. You will receive a reminder letter in the mail two months in advance. If you don't receive a letter, please call our office to schedule the follow-up appointment.  Any Additional Special Instructions Will Be Listed Below (If Applicable). - if Dr. Roxy Manns does not plan on any surgical procedure for you- call back and let us know ([phone]) and we will arrange a follow up echo for you.    If you need a refill on your cardiac medications before your next appointment, please call your pharmacy.

## 2016-04-30 NOTE — Progress Notes (Signed)
Patient Care Team: Ezequiel Kayser, MD as PCP - General (Internal Medicine)   HPI  [name] is a 81 y.o. female Seen in follow-up for a pacemaker implanted 8/16 in the context of multi-valvular surgery bioprosthetic mitral valve replacement, tricuspid valve repair and MAZE operation. She developed postoperative complete heart block [facility])  She has a history of atrial fibrillation  Records and Results Reviewed  Including hospital records. Echocardiogram 5/16 demonstrated normal LV function.  Subsequent echocardiogram demonstrated an ejection fraction of 30-35% in the context of RV apical pacing. This was inactivated which she was seen in 1/17 with immediate improvement in symptoms. Repeat echocardiogram has not yet been done  Unfortunately, she also developed a sternal wound infection.   Significant symptoms of exercise intolerance developed post implantation and device programming. When seen last week inactivated ventricular pacing by activating A-D and allowing for intrinsic conduction.  Reassessment of LV function has been postponed because of the active sternal wound.       Past Medical History:  Diagnosis Date  . Anxiety   . Arthritis    "hands" (11/10/2015)  . CAD s/p CABG x 1    a. 04/2015 LIMA to LAD  . Cancer of right breast ([place]) 08/09/2001  . Chronic diastolic CHF (congestive heart failure) ([place])   . COPD (chronic obstructive pulmonary disease) ([place])   . COPD (chronic obstructive pulmonary disease) ([place])   . GERD (gastroesophageal reflux disease)   . Hyperlipidemia   . Hypertension   . Hypothyroidism   . 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 04/2015 Complete bilateral atrial lesion set using cryothermy and bipolar radiofrequency ablation with clipping of LA appendage  . Meniere disease   . On home oxygen therapy    "2L at night" (11/10/2015)  . PAF (paroxysmal atrial fibrillation) ([place])   . Pneumonia ~ 2010  . PONV (postoperative nausea and vomiting)   .  Post-surgical complete heart block, symptomatic    a. 04/2015 s/p MDT [id number] Advisa DR MRI DC PPM.  . Presence of permanent cardiac pacemaker   . Pulmonary hypertension ([place])   . Severe Mitral Regurgitation s/p MV Repair    a. 04/2015 s/p 27 mm [facility] Mitral bovine bioprosthetic tissue valve  . Surgical wound, non healing - chest wall 11/10/2015  . Tricuspid Regurgitation s/p Repair    a. 04/2015 s/p 26 mm Edwards mc3 ring annuloplasty  . Wound infection ([place]) 10/13/2015   Superficial sternal wound infection    Past Surgical History:  Procedure Laterality Date  . APPLICATION OF A-CELL OF CHEST/ABDOMEN N/A 11/21/2015   Procedure: APPLICATION OF A-CELL OF CHEST/ABDOMEN;  Surgeon: Loel Lofty Dillingham, DO;  Location: [place];  Service: Plastics;  Laterality: N/A;  . APPLICATION OF A-CELL OF CHEST/ABDOMEN Right 12/21/2015   Procedure: APPLICATION OF A-CELL OF RIGHT CHEST;  Surgeon: Loel Lofty Dillingham, DO;  Location: [place];  Service: Plastics;  Laterality: Right;  . APPLICATION OF A-CELL OF CHEST/ABDOMEN Right 01/11/2016   Procedure: APPLICATION OF A-CELL OF RIGHT CHEST;  Surgeon: Loel Lofty Dillingham, DO;  Location: [place];  Service: Plastics;  Laterality: Right;  . APPLICATION OF A-CELL OF CHEST/ABDOMEN Right 02/12/2016   Procedure: APPLICATION OF A-CELL TO RIGHT CHEST WOUND;  Surgeon: Loel Lofty Dillingham, DO;  Location: [place];  Service: Plastics;  Laterality: Right;  . APPLICATION OF WOUND VAC N/A 05/09/2015   Procedure: APPLICATION OF WOUND VAC;  Surgeon: Rexene Alberts, MD;  Location:  [place] [facility];  Service: Thoracic;  Laterality: N/A;  . APPLICATION OF WOUND VAC N/A 11/13/2015   Procedure: APPLICATION OF WOUND VAC;  Surgeon: Rexene Alberts, MD;  Location: [place];  Service: Thoracic;  Laterality: N/A;  . APPLICATION OF WOUND VAC N/A 11/21/2015   Procedure: APPLICATION OF WOUND VAC;  Surgeon: Loel Lofty Dillingham, DO;  Location: [place];  Service: Plastics;  Laterality: N/A;  . APPLICATION OF WOUND VAC Right  12/21/2015   Procedure: APPLICATION OF WOUND VAC to right chest wall ;  Surgeon: Loel Lofty Dillingham, DO;  Location: [place];  Service: Plastics;  Laterality: Right;  . APPLICATION OF WOUND VAC Right 01/11/2016   Procedure: APPLICATION OF WOUND VAC RIGHT CHEST ;  Surgeon: Loel Lofty Dillingham, DO;  Location: [place];  Service: Plastics;  Laterality: Right;  . APPLICATION OF WOUND VAC Right 01/24/2016   Procedure: APPLICATION OF WOUND VAC RIGHT CHEST WALL;  Surgeon: Loel Lofty Dillingham, DO;  Location: [place];  Service: Plastics;  Laterality: Right;  . APPLICATION OF WOUND VAC Right 02/12/2016   Procedure: RE-APPLICATION OF WOUND VAC TO RIGHT CHEST WOUND;  Surgeon: Loel Lofty Dillingham, DO;  Location: [place];  Service: Plastics;  Laterality: Right;  . BREAST BIOPSY Left 11/25/06   neg  . BREAST BIOPSY Left 01/20/12   /clip-neg  . CARDIAC CATHETERIZATION  11/2013   [facility]  . CARDIAC CATHETERIZATION  10/2014   [facility]  . CARDIAC VALVE REPLACEMENT    . CATARACT EXTRACTION W/ INTRAOCULAR LENS  IMPLANT, BILATERAL Bilateral 2014  . CLIPPING OF ATRIAL APPENDAGE N/A 04/25/2015   Procedure: CLIPPING OF ATRIAL APPENDAGE;  Surgeon: Rexene Alberts, MD;  Location: [place];  Service: Open Heart Surgery;  Laterality: N/A;  . COCHLEAR IMPLANT Left 2005?  . CORONARY ARTERY BYPASS GRAFT N/A 04/25/2015   Procedure: CORONARY ARTERY BYPASS GRAFTING (CABG) x ONE, using left internal mammary artery;  Surgeon: Rexene Alberts, MD;  Location: [place];  Service: Open Heart Surgery;  Laterality: N/A;  . DILATION AND CURETTAGE OF UTERUS  "several before hysterectomy"  . EP IMPLANTABLE DEVICE N/A 05/02/2015   Procedure: Pacemaker Implant;  Surgeon: Thompson Grayer, MD;  [facility];  Service: Cardiovascular;  Laterality: N/A;  . I&D EXTREMITY Right 12/06/2015   Procedure: IRRIGATION AND DEBRIDEMENT RIGHT CHEST WALL WITH ACELL PLACEMENT AND VAC;  Surgeon: Loel Lofty Dillingham, DO;  Location: [place];  Service: Plastics;  Laterality: Right;   . INCISION AND DRAINAGE OF WOUND N/A 11/21/2015   Procedure: IRRIGATION AND DEBRIDEMENT WOUND;  Surgeon: Loel Lofty Dillingham, DO;  Location: [place];  Service: Plastics;  Laterality: N/A;  . INCISION AND DRAINAGE OF WOUND Right 12/21/2015   Procedure: IRRIGATION AND DEBRIDEMENT right chest wall WOUND;  Surgeon: Loel Lofty Dillingham, DO;  Location: [place];  Service: Plastics;  Laterality: Right;  right chest wall   . INCISION AND DRAINAGE OF WOUND Right 01/24/2016   Procedure: IRRIGATION AND DEBRIDEMENT RIGHT CHEST WALL WOUND;  Surgeon: Loel Lofty Dillingham, DO;  Location: [place];  Service: Plastics;  Laterality: Right;  . INCISION AND DRAINAGE OF WOUND Right 03/28/2016   Procedure: IRRIGATION AND DEBRIDEMENT RIGHT CHEST WALL WOUND WITH A Cell Placement;  Surgeon: Loel Lofty Dillingham, DO;  Location: [place];  Service: Plastics;  Laterality: Right;  . INSERT / REPLACE / REMOVE PACEMAKER    . IRRIGATION AND DEBRIDEMENT OF WOUND WITH SPLIT THICKNESS SKIN GRAFT Right 01/11/2016   Procedure: IRRIGATION AND DEBRIDEMENT OF RIGHT  CHEST WOUND ;  Surgeon: Loel Lofty Dillingham, DO;  Location: [place];  Service: Plastics;  Laterality: Right;  . MASTECTOMY, PARTIAL Right 2002  . MAZE N/A 04/25/2015   Procedure: MAZE;  Surgeon: Rexene Alberts, MD;  Location: [place];  Service: Open Heart Surgery;  Laterality: N/A;  . MITRAL VALVE REPAIR N/A 04/25/2015   Procedure: MITRAL VALVE  REPLACEMENT using a 27 mm Edwards Perimount Magna Mitral Ease Valve;  Surgeon: Rexene Alberts, MD;  Location: [place];  Service: Open Heart Surgery;  Laterality: N/A;  . SKIN SPLIT GRAFT Right 02/12/2016   Procedure: IRRIGATION AND DEBRIDEMENT RIGHT CHEST WOUND;  Surgeon: Loel Lofty Dillingham, DO;  Location: [place];  Service: Plastics;  Laterality: Right;  . STERNAL WIRES REMOVAL N/A 06/05/2015   Procedure: STERNAL WIRES REMOVAL;  Surgeon: Rexene Alberts, MD;  Location: [place];  Service: Thoracic;  Laterality: N/A;  . STERNAL WOUND DEBRIDEMENT N/A 05/09/2015    Procedure: STERNAL WOUND DEBRIDEMENT;  Surgeon: Rexene Alberts, MD;  Location: [place];  Service: Thoracic;  Laterality: N/A;  . STERNAL WOUND DEBRIDEMENT N/A 11/13/2015   Procedure: Excisional drainage of RIGHT Chest wall mass and breast mass ;  Surgeon: Rexene Alberts, MD;  Location: [place];  Service: Thoracic;  Laterality: N/A;  . TEE WITHOUT CARDIOVERSION N/A 04/25/2015   Procedure: TRANSESOPHAGEAL ECHOCARDIOGRAM (TEE);  Surgeon: Rexene Alberts, MD;  Location: [place];  Service: Open Heart Surgery;  Laterality: N/A;  . TONSILLECTOMY    . TRAM Right 11/18/2015   Procedure: VRAM Vertical Rectus Abdominus Muscle Flap;  Surgeon: Loel Lofty Dillingham, DO;  Location: [place];  Service: Plastics;  Laterality: Right;  RIght Back  . TRICUSPID VALVE REPLACEMENT N/A 04/25/2015   Procedure: TRICUSPID VALVE REPAIR;  Surgeon: Rexene Alberts, MD;  Location: [place];  Service: Open Heart Surgery;  Laterality: N/A;  . VAGINAL HYSTERECTOMY      Current Outpatient Prescriptions  Medication Sig Dispense Refill  . acetaminophen (TYLENOL) 325 MG tablet Take 2 tablets (650 mg total) by mouth every 4 (four) hours as needed for mild pain (or Fever >/= 101).    Marland Kitchen aspirin EC 81 MG EC tablet Take 1 tablet (81 mg total) by mouth daily.    . bisoprolol (ZEBETA) 10 MG tablet Take 1 tablet (10 mg total) by mouth daily. 30 tablet 5  . fluticasone (FLONASE) 50 MCG/ACT nasal spray Place 1 spray into both nostrils daily as needed for allergies.     . furosemide (LASIX) 20 MG tablet Take 20 mg by mouth 2 (two) times daily.     Marland Kitchen gabapentin (NEURONTIN) 100 MG capsule Take 100 mg by mouth at bedtime.   1  . ipratropium-albuterol (DUONEB) 0.5-2.5 (3) MG/3ML SOLN Take 3 mLs by nebulization every 6 (six) hours as needed (for shortness of breath). 360 mL 10  . levothyroxine (SYNTHROID, LEVOTHROID) 88 MCG tablet Take 88 mcg by mouth daily before breakfast.    . losartan (COZAAR) 100 MG tablet Take 1 tablet (100 mg total) by mouth daily. 30  tablet 3  . Multiple Vitamins-Minerals (PRESERVISION AREDS 2 PO) Take 2 tablets by mouth daily.    . pantoprazole (PROTONIX) 40 MG tablet Take 40 mg by mouth 2 (two) times daily.     . potassium chloride SA (K-DUR,KLOR-CON) 20 MEQ tablet Take 20 mEq by mouth daily.  2  . simvastatin (ZOCOR) 20 MG tablet Take 20 mg by mouth every evening.     Marland Kitchen  spironolactone (ALDACTONE) 25 MG tablet Take 1 tablet (25 mg total) by mouth daily. 30 tablet 6  . warfarin (COUMADIN) 2.5 MG tablet TAKE 1 TABLET BY MOUTH AS DIRECTED 40 tablet 2   No current facility-administered medications for this visit.     Allergies  Allergen Reactions  . Ace Inhibitors Cough  . Augmentin [Amoxicillin-Pot Clavulanate] Swelling    JOINTS, PAIN  . Penicillins Swelling    SWOLLEN JOINTS  Has patient had a PCN reaction causing immediate rash, facial/tongue/throat swelling, SOB or lightheadedness with hypotension: Yes Has patient had a PCN reaction causing severe rash involving mucus membranes or skin necrosis: No Has patient had a PCN reaction that required hospitalization No Has patient had a PCN reaction occurring within the last 10 years: No If all of the above answers are "NO", then may proceed with Cephalosporin use.   . Nifedipine Other (See Comments)    UNSPECIFIED  . Propranolol Other (See Comments)    UNSPECIFIED  . Diazepam Other (See Comments)    Made her "hyper"  . Meperidine Other (See Comments)    Made her "hyper"  . Propoxyphene Other (See Comments)    Made her "hyper"      Review of Systems negative except from HPI and PMH  Physical Exam BP 130/62 (BP Location: Right Arm, Patient Position: Sitting, Cuff Size: Normal)   Pulse 81   Ht 5\' 1"  (1.549 m)   Wt 132 lb (59.9 kg)   SpO2 92%   BMI 24.94 kg/m  Well developed and well nourished in no acute distress HENT normal E scleral and icterus clear Neck Supple JVP flat; carotids brisk and full Clear to ausculation Device pocket well healed;  without hematoma or erythema.  There is no tethering Regular rate and rhythm, no murmurs gallops or rub RRR and 2/6 M Soft with active bowel sounds No clubbing cyanosis  Edema Alert and oriented, grossly normal motor and sensory function Skin Warm and Dry  ECG demonstrates AV pacing  Assessment and  Plan   atrial fibrillation-paroxysmal status post maze operation  Complete heart block -Intermittent  Sinus node dysfunction-arrest  Status post mitral valve replacement-bioprosthetic with tricuspid valve repair  Pacemaker-[employer]   She continues to feel better. We will repeat an echo either as an outpatient or as an inpatient if when she sees Dr. Faye Ramsay next month the decision was made to revise her sternal wound.  I do worry to some degree about the risk of seeding her pacemaker.

## 2016-05-06 ENCOUNTER — Ambulatory Visit: Payer: Medicare Other | Admitting: Thoracic Surgery (Cardiothoracic Vascular Surgery)

## 2016-05-07 ENCOUNTER — Other Ambulatory Visit: Payer: Self-pay | Admitting: Plastic Surgery

## 2016-05-07 DIAGNOSIS — S21101A Unspecified open wound of right front wall of thorax without penetration into thoracic cavity, initial encounter: Secondary | ICD-10-CM

## 2016-05-10 LAB — ACID FAST CULTURE WITH REFLEXED SENSITIVITIES: ACID FAST CULTURE - AFSCU3: NEGATIVE

## 2016-05-14 ENCOUNTER — Other Ambulatory Visit: Payer: Self-pay | Admitting: Plastic Surgery

## 2016-05-16 ENCOUNTER — Encounter (HOSPITAL_COMMUNITY): Payer: Self-pay | Admitting: *Deleted

## 2016-05-16 MED ORDER — CIPROFLOXACIN IN D5W 400 MG/200ML IV SOLN
400.0000 mg | INTRAVENOUS | Status: AC
  Administered 2016-05-17: 400 mg via INTRAVENOUS
  Filled 2016-05-16: qty 200

## 2016-05-16 NOTE — Progress Notes (Signed)
Pt SDW-Pre-op call was completed by pt spouse, Dorothyann Peng ( with consent per note in phone call comments). Spouse stated that pt last dose of Coumadin was Sunday and last dose of Heparin was this morning. Spouse verbalized understanding of all pre-op instructions.

## 2016-05-17 ENCOUNTER — Encounter (HOSPITAL_COMMUNITY)
Admission: RE | Disposition: A | Discharge status: Home / Self Care | Payer: Self-pay | Source: Ambulatory Visit | Attending: Plastic Surgery

## 2016-05-17 ENCOUNTER — Ambulatory Visit (HOSPITAL_COMMUNITY): Payer: Medicare Other | Admitting: Anesthesiology

## 2016-05-17 ENCOUNTER — Encounter (HOSPITAL_COMMUNITY): Payer: Self-pay | Admitting: Plastic Surgery

## 2016-05-17 ENCOUNTER — Ambulatory Visit (HOSPITAL_COMMUNITY)
Admission: RE | Admit: 2016-05-17 | Discharge: 2016-05-17 | Disposition: A | Discharge status: Home / Self Care | Payer: Medicare Other | Source: Ambulatory Visit | Attending: Plastic Surgery | Admitting: Plastic Surgery

## 2016-05-17 DIAGNOSIS — I2510 Atherosclerotic heart disease of native coronary artery without angina pectoris: Secondary | ICD-10-CM | POA: Diagnosis not present

## 2016-05-17 DIAGNOSIS — Z9981 Dependence on supplemental oxygen: Secondary | ICD-10-CM | POA: Diagnosis not present

## 2016-05-17 DIAGNOSIS — Z952 Presence of prosthetic heart valve: Secondary | ICD-10-CM | POA: Insufficient documentation

## 2016-05-17 DIAGNOSIS — F419 Anxiety disorder, unspecified: Secondary | ICD-10-CM | POA: Diagnosis not present

## 2016-05-17 DIAGNOSIS — E039 Hypothyroidism, unspecified: Secondary | ICD-10-CM | POA: Diagnosis not present

## 2016-05-17 DIAGNOSIS — Y838 Other surgical procedures as the cause of abnormal reaction of the patient, or of later complication, without mention of misadventure at the time of the procedure: Secondary | ICD-10-CM | POA: Diagnosis not present

## 2016-05-17 DIAGNOSIS — Z951 Presence of aortocoronary bypass graft: Secondary | ICD-10-CM | POA: Diagnosis not present

## 2016-05-17 DIAGNOSIS — Z7901 Long term (current) use of anticoagulants: Secondary | ICD-10-CM | POA: Insufficient documentation

## 2016-05-17 DIAGNOSIS — I5032 Chronic diastolic (congestive) heart failure: Secondary | ICD-10-CM | POA: Diagnosis not present

## 2016-05-17 DIAGNOSIS — K219 Gastro-esophageal reflux disease without esophagitis: Secondary | ICD-10-CM | POA: Diagnosis not present

## 2016-05-17 DIAGNOSIS — Z87891 Personal history of nicotine dependence: Secondary | ICD-10-CM | POA: Insufficient documentation

## 2016-05-17 DIAGNOSIS — Z950 Presence of cardiac pacemaker: Secondary | ICD-10-CM | POA: Diagnosis not present

## 2016-05-17 DIAGNOSIS — I110 Hypertensive heart disease with heart failure: Secondary | ICD-10-CM | POA: Insufficient documentation

## 2016-05-17 DIAGNOSIS — T8189XD Other complications of procedures, not elsewhere classified, subsequent encounter: Secondary | ICD-10-CM | POA: Insufficient documentation

## 2016-05-17 DIAGNOSIS — J449 Chronic obstructive pulmonary disease, unspecified: Secondary | ICD-10-CM | POA: Insufficient documentation

## 2016-05-17 DIAGNOSIS — M1990 Unspecified osteoarthritis, unspecified site: Secondary | ICD-10-CM | POA: Insufficient documentation

## 2016-05-17 DIAGNOSIS — Z853 Personal history of malignant neoplasm of breast: Secondary | ICD-10-CM | POA: Insufficient documentation

## 2016-05-17 DIAGNOSIS — S21101A Unspecified open wound of right front wall of thorax without penetration into thoracic cavity, initial encounter: Secondary | ICD-10-CM

## 2016-05-17 HISTORY — PX: INCISION AND DRAINAGE OF WOUND: SHX1803

## 2016-05-17 LAB — BASIC METABOLIC PANEL
Anion gap: 10 (ref 5–15)
BUN: 14 mg/dL (ref 6–20)
CALCIUM: 9.6 mg/dL (ref 8.9–10.3)
CO2: 26 mmol/L (ref 22–32)
CREATININE: 0.68 mg/dL (ref 0.44–1.00)
Chloride: 105 mmol/L (ref 101–111)
GLUCOSE: 113 mg/dL — AB (ref 65–99)
Potassium: 4 mmol/L (ref 3.5–5.1)
Sodium: 141 mmol/L (ref 135–145)

## 2016-05-17 LAB — CBC
HEMATOCRIT: 33.6 % — AB (ref 36.0–46.0)
Hemoglobin: 10 g/dL — ABNORMAL LOW (ref 12.0–15.0)
MCH: 27.4 pg (ref 26.0–34.0)
MCHC: 29.8 g/dL — ABNORMAL LOW (ref 30.0–36.0)
MCV: 92.1 fL (ref 78.0–100.0)
PLATELETS: 186 10*3/uL (ref 150–400)
RBC: 3.65 MIL/uL — ABNORMAL LOW (ref 3.87–5.11)
RDW: 22.4 % — AB (ref 11.5–15.5)
WBC: 7.5 10*3/uL (ref 4.0–10.5)

## 2016-05-17 LAB — PROTIME-INR
INR: 1.08
PROTHROMBIN TIME: 14 s (ref 11.4–15.2)

## 2016-05-17 LAB — APTT: aPTT: 32 seconds (ref 24–36)

## 2016-05-17 SURGERY — IRRIGATION AND DEBRIDEMENT WOUND
Anesthesia: General | Site: Chest | Laterality: Right

## 2016-05-17 MED ORDER — SODIUM CHLORIDE 0.9 % IR SOLN
Status: DC | PRN
  Administered 2016-05-17: 1000 mL

## 2016-05-17 MED ORDER — FENTANYL CITRATE (PF) 100 MCG/2ML IJ SOLN: 25.0000 ug | INTRAMUSCULAR | Status: DC | PRN

## 2016-05-17 MED ORDER — LIDOCAINE HCL (CARDIAC) 20 MG/ML IV SOLN
INTRAVENOUS | Status: DC | PRN
  Administered 2016-05-17: 50 mg via INTRATRACHEAL

## 2016-05-17 MED ORDER — LACTATED RINGERS IV SOLN
Freq: Once | INTRAVENOUS | Status: AC
  Administered 2016-05-17: 10:00:00 via INTRAVENOUS

## 2016-05-17 MED ORDER — FENTANYL CITRATE (PF) 250 MCG/5ML IJ SOLN
INTRAMUSCULAR | Status: DC | PRN
  Administered 2016-05-17 (×2): 50 ug via INTRAVENOUS

## 2016-05-17 MED ORDER — ONDANSETRON HCL 4 MG/2ML IJ SOLN: 4.0000 mg | Freq: Once | INTRAMUSCULAR | Status: DC | PRN

## 2016-05-17 MED ORDER — PROPOFOL 10 MG/ML IV BOLUS
INTRAVENOUS | Status: DC | PRN
  Administered 2016-05-17: 90 mg via INTRAVENOUS

## 2016-05-17 MED ORDER — SODIUM CHLORIDE 0.9 % IR SOLN
Status: DC | PRN
  Administered 2016-05-17: 500 mL

## 2016-05-17 MED ORDER — LACTATED RINGERS IV SOLN
INTRAVENOUS | Status: DC | PRN
  Administered 2016-05-17: 10:00:00 via INTRAVENOUS

## 2016-05-17 MED ORDER — PROPOFOL 10 MG/ML IV BOLUS
INTRAVENOUS | Status: AC
  Filled 2016-05-17: qty 20

## 2016-05-17 MED ORDER — CHLORHEXIDINE GLUCONATE CLOTH 2 % EX PADS: 6.0000 | MEDICATED_PAD | Freq: Once | CUTANEOUS | Status: DC

## 2016-05-17 MED ORDER — FENTANYL CITRATE (PF) 100 MCG/2ML IJ SOLN
INTRAMUSCULAR | Status: AC
  Filled 2016-05-17: qty 2

## 2016-05-17 SURGICAL SUPPLY — 47 items
APPLICATOR COTTON TIP 6IN STRL (MISCELLANEOUS) ×9 IMPLANT
BAG DECANTER FOR FLEXI CONT (MISCELLANEOUS) IMPLANT
BENZOIN TINCTURE PRP APPL 2/3 (GAUZE/BANDAGES/DRESSINGS) IMPLANT
CANISTER SUCTION 2500CC (MISCELLANEOUS) ×3 IMPLANT
COVER SURGICAL LIGHT HANDLE (MISCELLANEOUS) ×3 IMPLANT
DRAPE IMP U-DRAPE 54X76 (DRAPES) ×3 IMPLANT
DRAPE INCISE IOBAN 66X45 STRL (DRAPES) IMPLANT
DRAPE LAPAROSCOPIC ABDOMINAL (DRAPES) ×3 IMPLANT
DRAPE PROXIMA HALF (DRAPES) IMPLANT
DRESSING HYDROCOLLOID 4X4 (GAUZE/BANDAGES/DRESSINGS) IMPLANT
DRSG ADAPTIC 3X8 NADH LF (GAUZE/BANDAGES/DRESSINGS) IMPLANT
DRSG PAD ABDOMINAL 8X10 ST (GAUZE/BANDAGES/DRESSINGS) IMPLANT
DRSG VAC ATS LRG SENSATRAC (GAUZE/BANDAGES/DRESSINGS) IMPLANT
DRSG VAC ATS MED SENSATRAC (GAUZE/BANDAGES/DRESSINGS) IMPLANT
DRSG VAC ATS SM SENSATRAC (GAUZE/BANDAGES/DRESSINGS) IMPLANT
ELECT CAUTERY BLADE 6.4 (BLADE) IMPLANT
ELECT REM PT RETURN 9FT ADLT (ELECTROSURGICAL) ×3
ELECTRODE REM PT RTRN 9FT ADLT (ELECTROSURGICAL) ×1 IMPLANT
GAUZE IODOFORM PACK 1/2 7832 (GAUZE/BANDAGES/DRESSINGS) ×3 IMPLANT
GAUZE SPONGE 4X4 12PLY STRL (GAUZE/BANDAGES/DRESSINGS) ×3 IMPLANT
GLOVE BIO SURGEON STRL SZ 6.5 (GLOVE) ×4 IMPLANT
GLOVE BIO SURGEONS STRL SZ 6.5 (GLOVE) ×2
GLOVE BIOGEL PI IND STRL 6.5 (GLOVE) ×1 IMPLANT
GLOVE BIOGEL PI INDICATOR 6.5 (GLOVE) ×2
GLOVE ECLIPSE 6.5 STRL STRAW (GLOVE) ×3 IMPLANT
GOWN STRL REUS W/ TWL LRG LVL3 (GOWN DISPOSABLE) ×2 IMPLANT
GOWN STRL REUS W/TWL LRG LVL3 (GOWN DISPOSABLE) ×4
KIT BASIN OR (CUSTOM PROCEDURE TRAY) ×3 IMPLANT
KIT ROOM TURNOVER OR (KITS) ×3 IMPLANT
MICROMATRIX 1000MG (Tissue) ×3 IMPLANT
NEEDLE HYPO 25GX1X1/2 BEV (NEEDLE) ×3 IMPLANT
NS IRRIG 1000ML POUR BTL (IV SOLUTION) ×3 IMPLANT
PACK GENERAL/GYN (CUSTOM PROCEDURE TRAY) ×3 IMPLANT
PACK UNIVERSAL I (CUSTOM PROCEDURE TRAY) IMPLANT
PAD ABD 8X10 STRL (GAUZE/BANDAGES/DRESSINGS) ×3 IMPLANT
PAD ARMBOARD 7.5X6 YLW CONV (MISCELLANEOUS) ×6 IMPLANT
SOLUTION PARTIC MCRMTRX 1000MG (Tissue) ×1 IMPLANT
SPONGE GAUZE 4X4 12PLY STER LF (GAUZE/BANDAGES/DRESSINGS) ×3 IMPLANT
STAPLER VISISTAT 35W (STAPLE) IMPLANT
SURGILUBE 2OZ TUBE FLIPTOP (MISCELLANEOUS) IMPLANT
SUT VIC AB 5-0 PS2 18 (SUTURE) IMPLANT
SWAB COLLECTION DEVICE MRSA (MISCELLANEOUS) IMPLANT
SYR CONTROL 10ML LL (SYRINGE) ×3 IMPLANT
TAPE CLOTH SURG 4X10 WHT LF (GAUZE/BANDAGES/DRESSINGS) ×3 IMPLANT
TOWEL OR 17X26 10 PK STRL BLUE (TOWEL DISPOSABLE) ×3 IMPLANT
TUBE ANAEROBIC SPECIMEN COL (MISCELLANEOUS) IMPLANT
UNDERPAD 30X30 (UNDERPADS AND DIAPERS) IMPLANT

## 2016-05-17 NOTE — Transfer of Care (Signed)
Immediate Anesthesia Transfer of Care Note  Patient: [name]  Procedure(s) Performed: Procedure(s): IRRIGATION AND DEBRIDEMENT OF RIGHT CHEST WOUND WITH A CELL PLACEMENT (Right)  Patient Location: PACU  Anesthesia Type:General  Level of Consciousness: awake  Airway & Oxygen Therapy: Patient Spontanous Breathing  Post-op Assessment: Report given to RN and Post -op Vital signs reviewed and stable  Post vital signs: Reviewed and stable  Last Vitals:  Vitals:   05/17/16 0837  BP: 129/66  Pulse: 80  Resp: 20  Temp: 36.6 C    Last Pain:  Vitals:   05/17/16 0837  TempSrc: Oral         Complications: No apparent anesthesia complications

## 2016-05-17 NOTE — Progress Notes (Signed)
AVS reviewed; medication not reconciled: Dr Marla Roe notified of status; Stated patient can restart all med except lovenox, OK to restart coumadin.

## 2016-05-17 NOTE — H&P (Signed)
[name] is an 81 y.o. female.   Chief Complaint: right chest wound HPI: The patient is a 81 yrs old wf here with her husband for treatment of her right chest wound.  She has undergone debridement and Acell with VAC in the past.  There is concern about the rib and cartilage in that area.  Past Medical History:  Diagnosis Date  . Anxiety   . Arthritis    "hands" (11/10/2015)  . CAD s/p CABG x 1    a. 04/2015 LIMA to LAD  . Cancer of right breast ([place]) 08/09/2001  . Chronic diastolic CHF (congestive heart failure) ([place])   . COPD (chronic obstructive pulmonary disease) ([place])   . COPD (chronic obstructive pulmonary disease) ([place])   . GERD (gastroesophageal reflux disease)   . Hyperlipidemia   . Hypertension   . Hypothyroidism   . 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 04/2015 Complete bilateral atrial lesion set using cryothermy and bipolar radiofrequency ablation with clipping of LA appendage  . Meniere disease   . On home oxygen therapy    "2L at night" (11/10/2015)  . PAF (paroxysmal atrial fibrillation) ([place])   . Pneumonia ~ 2010  . PONV (postoperative nausea and vomiting)    Pt felt like eardrums were gonna pop  . Post-surgical complete heart block, symptomatic    a. 04/2015 s/p MDT [id number] Advisa DR MRI DC PPM.  . Presence of permanent cardiac pacemaker   . Pulmonary hypertension ([place])   . Severe Mitral Regurgitation s/p MV Repair    a. 04/2015 s/p 27 mm [facility] Mitral bovine bioprosthetic tissue valve  . Surgical wound, non healing - chest wall 11/10/2015  . Tricuspid Regurgitation s/p Repair    a. 04/2015 s/p 26 mm Edwards mc3 ring annuloplasty  . Wound infection ([place]) 10/13/2015   Superficial sternal wound infection    Past Surgical History:  Procedure Laterality Date  . APPLICATION OF A-CELL OF CHEST/ABDOMEN N/A 11/21/2015   Procedure: APPLICATION OF A-CELL OF CHEST/ABDOMEN;  Surgeon: Loel Lofty Cayenne Breault, DO;  Location: [place];  Service: Plastics;  Laterality: N/A;    . APPLICATION OF A-CELL OF CHEST/ABDOMEN Right 12/21/2015   Procedure: APPLICATION OF A-CELL OF RIGHT CHEST;  Surgeon: Loel Lofty Marvis Saefong, DO;  Location: [place];  Service: Plastics;  Laterality: Right;  . APPLICATION OF A-CELL OF CHEST/ABDOMEN Right 01/11/2016   Procedure: APPLICATION OF A-CELL OF RIGHT CHEST;  Surgeon: Loel Lofty Pheonix Clinkscale, DO;  Location: [place];  Service: Plastics;  Laterality: Right;  . APPLICATION OF A-CELL OF CHEST/ABDOMEN Right 02/12/2016   Procedure: APPLICATION OF A-CELL TO RIGHT CHEST WOUND;  Surgeon: Loel Lofty Chadley Dziedzic, DO;  Location: [place];  Service: Plastics;  Laterality: Right;  . APPLICATION OF WOUND VAC N/A 05/09/2015   Procedure: APPLICATION OF WOUND VAC;  Surgeon: Rexene Alberts, MD;  Location: [place];  Service: Thoracic;  Laterality: N/A;  . APPLICATION OF WOUND VAC N/A 11/13/2015   Procedure: APPLICATION OF WOUND VAC;  Surgeon: Rexene Alberts, MD;  Location: [place];  Service: Thoracic;  Laterality: N/A;  . APPLICATION OF WOUND VAC N/A 11/21/2015   Procedure: APPLICATION OF WOUND VAC;  Surgeon: Loel Lofty Avyukth Bontempo, DO;  Location: [place];  Service: Plastics;  Laterality: N/A;  . APPLICATION OF WOUND VAC Right 12/21/2015   Procedure: APPLICATION OF WOUND VAC to right chest wall ;  Surgeon: Loel Lofty Owens Hara, DO;  Location: [place];  Service: Plastics;  Laterality: Right;  .  APPLICATION OF WOUND VAC Right 01/11/2016   Procedure: APPLICATION OF WOUND VAC RIGHT CHEST ;  Surgeon: Loel Lofty Anthany Thornhill, DO;  Location: [place];  Service: Plastics;  Laterality: Right;  . APPLICATION OF WOUND VAC Right 01/24/2016   Procedure: APPLICATION OF WOUND VAC RIGHT CHEST WALL;  Surgeon: Loel Lofty Jorden Minchey, DO;  Location: [place];  Service: Plastics;  Laterality: Right;  . APPLICATION OF WOUND VAC Right 02/12/2016   Procedure: RE-APPLICATION OF WOUND VAC TO RIGHT CHEST WOUND;  Surgeon: Loel Lofty Trever Streater, DO;  Location: [place];  Service: Plastics;  Laterality: Right;  . BREAST BIOPSY Left 11/25/06    neg  . BREAST BIOPSY Left 01/20/12   /clip-neg  . CARDIAC CATHETERIZATION  11/2013   [facility]  . CARDIAC CATHETERIZATION  10/2014   [facility]  . CARDIAC VALVE REPLACEMENT    . CATARACT EXTRACTION W/ INTRAOCULAR LENS  IMPLANT, BILATERAL Bilateral 2014  . CLIPPING OF ATRIAL APPENDAGE N/A 04/25/2015   Procedure: CLIPPING OF ATRIAL APPENDAGE;  Surgeon: Rexene Alberts, MD;  Location: [place];  Service: Open Heart Surgery;  Laterality: N/A;  . COCHLEAR IMPLANT Left 2005?  . CORONARY ARTERY BYPASS GRAFT N/A 04/25/2015   Procedure: CORONARY ARTERY BYPASS GRAFTING (CABG) x ONE, using left internal mammary artery;  Surgeon: Rexene Alberts, MD;  Location: [place];  Service: Open Heart Surgery;  Laterality: N/A;  . DILATION AND CURETTAGE OF UTERUS  "several before hysterectomy"  . EP IMPLANTABLE DEVICE N/A 05/02/2015   Procedure: Pacemaker Implant;  Surgeon: Thompson Grayer, MD;  Location: [place] [facility];  Service: Cardiovascular;  Laterality: N/A;  . I&D EXTREMITY Right 12/06/2015   Procedure: IRRIGATION AND DEBRIDEMENT RIGHT CHEST WALL WITH ACELL PLACEMENT AND VAC;  Surgeon: Loel Lofty Jerzie Bieri, DO;  Location: [place];  Service: Plastics;  Laterality: Right;  . INCISION AND DRAINAGE OF WOUND N/A 11/21/2015   Procedure: IRRIGATION AND DEBRIDEMENT WOUND;  Surgeon: Loel Lofty Ara Grandmaison, DO;  Location: [place];  Service: Plastics;  Laterality: N/A;  . INCISION AND DRAINAGE OF WOUND Right 12/21/2015   Procedure: IRRIGATION AND DEBRIDEMENT right chest wall WOUND;  Surgeon: Loel Lofty Emrys Mckamie, DO;  Location: [place];  Service: Plastics;  Laterality: Right;  right chest wall   . INCISION AND DRAINAGE OF WOUND Right 01/24/2016   Procedure: IRRIGATION AND DEBRIDEMENT RIGHT CHEST WALL WOUND;  Surgeon: Loel Lofty Armen Waring, DO;  Location: [place];  Service: Plastics;  Laterality: Right;  . INCISION AND DRAINAGE OF WOUND Right 03/28/2016   Procedure: IRRIGATION AND DEBRIDEMENT RIGHT CHEST WALL WOUND WITH A Cell Placement;  Surgeon: Loel Lofty  Tore Carreker, DO;  Location: [place];  Service: Plastics;  Laterality: Right;  . INSERT / REPLACE / REMOVE PACEMAKER    . IRRIGATION AND DEBRIDEMENT OF WOUND WITH SPLIT THICKNESS SKIN GRAFT Right 01/11/2016   Procedure: IRRIGATION AND DEBRIDEMENT OF RIGHT CHEST WOUND ;  Surgeon: Loel Lofty Kourtlyn Charlet, DO;  Location: [place];  Service: Plastics;  Laterality: Right;  . MASTECTOMY, PARTIAL Right 2002  . MAZE N/A 04/25/2015   Procedure: MAZE;  Surgeon: Rexene Alberts, MD;  Location: [place];  Service: Open Heart Surgery;  Laterality: N/A;  . MITRAL VALVE REPAIR N/A 04/25/2015   Procedure: MITRAL VALVE  REPLACEMENT using a 27 mm Edwards Perimount Magna Mitral Ease Valve;  Surgeon: Rexene Alberts, MD;  Location: [place];  Service: Open Heart Surgery;  Laterality: N/A;  . SKIN SPLIT GRAFT Right 02/12/2016   Procedure: IRRIGATION AND DEBRIDEMENT RIGHT  CHEST WOUND;  Surgeon: Loel Lofty Yasir Kitner, DO;  Location: [place];  Service: Plastics;  Laterality: Right;  . STERNAL WIRES REMOVAL N/A 06/05/2015   Procedure: STERNAL WIRES REMOVAL;  Surgeon: Rexene Alberts, MD;  Location: [place];  Service: Thoracic;  Laterality: N/A;  . STERNAL WOUND DEBRIDEMENT N/A 05/09/2015   Procedure: STERNAL WOUND DEBRIDEMENT;  Surgeon: Rexene Alberts, MD;  Location: [place];  Service: Thoracic;  Laterality: N/A;  . STERNAL WOUND DEBRIDEMENT N/A 11/13/2015   Procedure: Excisional drainage of RIGHT Chest wall mass and breast mass ;  Surgeon: Rexene Alberts, MD;  Location: [place];  Service: Thoracic;  Laterality: N/A;  . TEE WITHOUT CARDIOVERSION N/A 04/25/2015   Procedure: TRANSESOPHAGEAL ECHOCARDIOGRAM (TEE);  Surgeon: Rexene Alberts, MD;  Location: [place];  Service: Open Heart Surgery;  Laterality: N/A;  . TONSILLECTOMY    . TRAM Right 11/18/2015   Procedure: VRAM Vertical Rectus Abdominus Muscle Flap;  Surgeon: Loel Lofty Shatona Andujar, DO;  Location: [place];  Service: Plastics;  Laterality: Right;  RIght Back  . TRICUSPID VALVE REPLACEMENT N/A 04/25/2015    Procedure: TRICUSPID VALVE REPAIR;  Surgeon: Rexene Alberts, MD;  Location: [place];  Service: Open Heart Surgery;  Laterality: N/A;  . VAGINAL HYSTERECTOMY      Family History  Problem Relation Age of Onset  . Heart disease Father   . Heart disease Brother   . Heart attack Paternal Uncle    Social History:  reports that she quit smoking about 18 years ago. Her smoking use included Cigarettes. She has a 60.00 pack-year smoking history. She has never used smokeless tobacco. She reports that she drinks alcohol. She reports that she does not use drugs.  Allergies:  Allergies  Allergen Reactions  . Ace Inhibitors Cough  . Augmentin [Amoxicillin-Pot Clavulanate] Swelling    JOINTS, PAIN  . Penicillins Swelling    SWOLLEN JOINTS  Has patient had a PCN reaction causing immediate rash, facial/tongue/throat swelling, SOB or lightheadedness with hypotension: Yes Has patient had a PCN reaction causing severe rash involving mucus membranes or skin necrosis: No Has patient had a PCN reaction that required hospitalization No Has patient had a PCN reaction occurring within the last 10 years: No If all of the above answers are "NO", then may proceed with Cephalosporin use.   . Nifedipine Other (See Comments)    UNSPECIFIED  . Propranolol Other (See Comments)    UNSPECIFIED  . Diazepam Other (See Comments)    Made her "hyper"  . Meperidine Other (See Comments)    Made her "hyper"  . Propoxyphene Other (See Comments)    Made her "hyper"    Medications Prior to Admission  Medication Sig Dispense Refill  . acetaminophen (TYLENOL) 325 MG tablet Take 2 tablets (650 mg total) by mouth every 4 (four) hours as needed for mild pain (or Fever >/= 101).    Marland Kitchen aspirin EC 81 MG EC tablet Take 1 tablet (81 mg total) by mouth daily.    . bisoprolol (ZEBETA) 10 MG tablet Take 1 tablet (10 mg total) by mouth daily. 30 tablet 5  . ferrous sulfate 325 (65 FE) MG tablet Take 325 mg by mouth daily with breakfast.     . fluticasone (FLONASE) 50 MCG/ACT nasal spray Place 1 spray into both nostrils daily as needed for allergies.     . furosemide (LASIX) 20 MG tablet Take 20 mg by mouth 2 (two) times daily.     Marland Kitchen gabapentin (  NEURONTIN) 100 MG capsule Take 100 mg by mouth at bedtime.   1  . ipratropium-albuterol (DUONEB) 0.5-2.5 (3) MG/3ML SOLN Take 3 mLs by nebulization every 6 (six) hours as needed (for shortness of breath). 360 mL 10  . levothyroxine (SYNTHROID, LEVOTHROID) 88 MCG tablet Take 88 mcg by mouth daily before breakfast.    . losartan (COZAAR) 100 MG tablet Take 1 tablet (100 mg total) by mouth daily. 30 tablet 3  . Multiple Vitamins-Minerals (PRESERVISION AREDS 2 PO) Take 2 tablets by mouth daily.    . pantoprazole (PROTONIX) 40 MG tablet Take 40 mg by mouth 2 (two) times daily.     . potassium chloride SA (K-DUR,KLOR-CON) 20 MEQ tablet Take 20 mEq by mouth daily.  2  . simvastatin (ZOCOR) 20 MG tablet Take 20 mg by mouth every evening.     Marland Kitchen spironolactone (ALDACTONE) 25 MG tablet Take 1 tablet (25 mg total) by mouth daily. 30 tablet 6  . warfarin (COUMADIN) 2.5 MG tablet TAKE 1 TABLET BY MOUTH AS DIRECTED (Patient taking differently: TK 1.5 tablets Tuesdays, thursdays, Fridays, and saturdays. Tk 1 tablet on the other days.) 40 tablet 2    No results found for this or any previous visit (from the past 48 hour(s)). No results found.  Review of Systems  Constitutional: Negative.   HENT: Negative.   Eyes: Negative.   Respiratory: Negative.   Cardiovascular: Negative.   Gastrointestinal: Negative.   Genitourinary: Negative.   Musculoskeletal: Negative.   Skin: Negative.   Neurological: Negative.   Psychiatric/Behavioral: Negative.     Blood pressure 129/66, pulse 80, temperature 97.9 F (36.6 C), temperature source Oral, resp. rate 20, height 5' 1.5" (1.562 m), weight 59.9 kg (132 lb), SpO2 95 %. Physical Exam  Constitutional: She is oriented to person, place, and time. She appears  well-developed and well-nourished.  HENT:  Head: Normocephalic and atraumatic.  Eyes: EOM are normal. Pupils are equal, round, and reactive to light.  Cardiovascular: Normal rate.   Respiratory: Effort normal.    Neurological: She is alert and oriented to person, place, and time.  Skin: Skin is warm.  Psychiatric: She has a normal mood and affect. Her behavior is normal. Judgment and thought content normal.     Assessment/Plan Plan debridement and Acell placement of the right chest wound.  Wallace Going, DO 05/17/2016, 9:05 AM

## 2016-05-17 NOTE — OR Nursing (Addendum)
Cochlear device removed by patient in OR 10. Placed in denture cup and labeled with patient stickers. Device remained with patient chart for transfer to PACU with patient at case end.

## 2016-05-17 NOTE — Anesthesia Postprocedure Evaluation (Signed)
Anesthesia Post Note  Patient: [name]  Procedure(s) Performed: Procedure(s) (LRB): IRRIGATION AND DEBRIDEMENT OF RIGHT CHEST WOUND WITH A CELL PLACEMENT (Right)  Patient location during evaluation: PACU Anesthesia Type: General Level of consciousness: awake and alert Pain management: pain level controlled Vital Signs Assessment: post-procedure vital signs reviewed and stable Respiratory status: spontaneous breathing, nonlabored ventilation, respiratory function stable and patient connected to nasal cannula oxygen Cardiovascular status: blood pressure returned to baseline and stable Postop Assessment: no signs of nausea or vomiting Anesthetic complications: no    Last Vitals:  Vitals:   05/17/16 1110 05/17/16 1140  BP: (!) 150/72   Pulse: 64   Resp: 10   Temp: 36.5 C 36.4 C    Last Pain:  Vitals:   05/17/16 0837  TempSrc: Oral                 Catalina [place]vel

## 2016-05-17 NOTE — Brief Op Note (Signed)
05/17/2016  10:22 AM  PATIENT:  [name]  81 y.o. female  PRE-OPERATIVE DIAGNOSIS:  right chest wound  POST-OPERATIVE DIAGNOSIS:  right chest wound  PROCEDURE:  Procedure(s): IRRIGATION AND DEBRIDEMENT OF RIGHT CHEST WOUND WITH A CELL PLACEMENT (Right)  SURGEON:  Surgeon(s) and Role:    * Etoile Looman S Shacara Cozine, DO - Primary  PHYSICIAN ASSISTANT: none  ASSISTANTS: none   ANESTHESIA:   general  EBL:  No intake/output data recorded.  BLOOD ADMINISTERED:none  DRAINS: none   LOCAL MEDICATIONS USED:  NONE  SPECIMEN:  Source of Specimen:  right chest wound  DISPOSITION OF SPECIMEN:  micro  COUNTS:  YES  TOURNIQUET:  * No tourniquets in log *  DICTATION: .Dragon Dictation  PLAN OF CARE: Discharge to home after PACU  PATIENT DISPOSITION:  PACU - hemodynamically stable.   Delay start of Pharmacological VTE agent (>24hrs) due to surgical blood loss or risk of bleeding: no

## 2016-05-17 NOTE — Discharge Instructions (Addendum)
Pull 1 cm of gauze out daily and cut it.  Place KY gel on it daily.

## 2016-05-17 NOTE — Anesthesia Preprocedure Evaluation (Addendum)
Anesthesia Evaluation  Patient identified by MRN, date of birth, ID band Patient awake    Reviewed: Allergy & Precautions, NPO status , Patient's Chart, lab work & pertinent test results  History of Anesthesia Complications (+) PONV and history of anesthetic complications  Airway Mallampati: II  TM Distance: >3 FB Neck ROM: Full    Dental  (+) Teeth Intact, Dental Advisory Given   Pulmonary COPD,  COPD inhaler and oxygen dependent, former smoker,    Pulmonary exam normal breath sounds clear to auscultation       Cardiovascular hypertension, Pt. on home beta blockers and Pt. on medications + CAD and + CABG  Normal cardiovascular exam+ dysrhythmias (s/p MAZE) Atrial Fibrillation + pacemaker  Rhythm:Regular Rate:Normal  PHTN S/p MVR, TV repair, MAZE   Neuro/Psych PSYCHIATRIC DISORDERS Anxiety Meniere disease     GI/Hepatic Neg liver ROS, GERD  Medicated and Controlled,  Endo/Other  Hypothyroidism   Renal/GU negative Renal ROS     Musculoskeletal  (+) Arthritis , Osteoarthritis,    Abdominal   Peds  Hematology  (+) Blood dyscrasia (Lovenox therapy), ,   Anesthesia Other Findings Day of surgery medications reviewed with the patient.  Reproductive/Obstetrics                            Anesthesia Physical Anesthesia Plan  ASA: IV  Anesthesia Plan: General   Post-op Pain Management:    Induction: Intravenous  Airway Management Planned: LMA  Additional Equipment:   Intra-op Plan:   Post-operative Plan: Extubation in OR  Informed Consent: I have reviewed the patients History and Physical, chart, labs and discussed the procedure including the risks, benefits and alternatives for the proposed anesthesia with the patient or authorized representative who has indicated his/her understanding and acceptance.   Dental advisory given  Plan Discussed with: CRNA, Anesthesiologist and  Surgeon  Anesthesia Plan Comments: (Risks/benefits of general anesthesia discussed with patient including risk of damage to teeth, lips, gum, and tongue, nausea/vomiting, allergic reactions to medications, and the possibility of heart attack, stroke and death.  All patient questions answered.  Patient wishes to proceed.  Magnet to be available in the OR.)      Anesthesia Quick Evaluation

## 2016-05-17 NOTE — Op Note (Signed)
Operative Note   DATE OF OPERATION: 05/17/2016  LOCATION: [facility]  SURGICAL DIVISION: Plastic Surgery  PREOPERATIVE DIAGNOSES:  Right chest wound  POSTOPERATIVE DIAGNOSES:  same  PROCEDURE:  Preparation of right chest wound 1 x 2 x 4 cm for placement of Acell 1 gm powder  SURGEON: Claire Sanger Dillingham, DO  ASSISTANT: Shawn Rayburn, PA  ANESTHESIA:  General.   COMPLICATIONS: None.   INDICATIONS FOR PROCEDURE:  The patient, [name] is a 81 y.o. female born on 09/02/1935, is here for treatment of a chronic right chest wound.  She underwent surgery on her right breast followed by radiation over 10 years ago.  She then had cardiac surgery and has had a year of trouble healing. MRN: [id number]  CONSENT:  Informed consent was obtained directly from the patient. Risks, benefits and alternatives were fully discussed. Specific risks including but not limited to bleeding, infection, hematoma, seroma, scarring, pain, infection, contracture, asymmetry, wound healing problems, and need for further surgery were all discussed. The patient did have an ample opportunity to have questions answered to satisfaction.   DESCRIPTION OF PROCEDURE:  The patient was taken to the operating room. SCDs were placed and IV antibiotics were given. The patient's operative site was prepped and draped in a sterile fashion. A time out was performed and all information was confirmed to be correct.  General anesthesia was administered.  The area was debrided with the curette.  There was no purulence or obvious infection note.  The area was irrigated.  The Acell powder was applied to the 1 x 2 x 4 cm area and nu-gauze applied.  Alvera Novel was placed and 4 x 4 gauze.  The patient tolerated the procedure well.  There were no complications. The patient was allowed to wake from anesthesia, extubated and taken to the recovery room in satisfactory condition.

## 2016-05-17 NOTE — Anesthesia Procedure Notes (Signed)
Procedure Name: LMA Insertion Date/Time: 05/17/2016 10:26 AM Performed by: Maude Leriche D Pre-anesthesia Checklist: Patient identified, Emergency Drugs available, Suction available, Patient being monitored and Timeout performed Patient Re-evaluated:Patient Re-evaluated prior to inductionOxygen Delivery Method: Circle system utilized Preoxygenation: Pre-oxygenation with 100% oxygen Intubation Type: IV induction Ventilation: Mask ventilation without difficulty LMA: LMA inserted LMA Size: 4.0 Number of attempts: 1 Placement Confirmation: positive ETCO2 and breath sounds checked- equal and bilateral Tube secured with: Tape Dental Injury: Teeth and Oropharynx as per pre-operative assessment

## 2016-05-20 ENCOUNTER — Encounter: Payer: Self-pay | Admitting: Cardiology

## 2016-05-20 ENCOUNTER — Encounter (HOSPITAL_COMMUNITY): Payer: Self-pay | Admitting: Plastic Surgery

## 2016-05-21 ENCOUNTER — Other Ambulatory Visit: Payer: Self-pay | Admitting: *Deleted

## 2016-05-21 DIAGNOSIS — IMO0001 Reserved for inherently not codable concepts without codable children: Secondary | ICD-10-CM

## 2016-05-21 DIAGNOSIS — T814XXD Infection following a procedure, subsequent encounter: Principal | ICD-10-CM

## 2016-05-21 DIAGNOSIS — I059 Rheumatic mitral valve disease, unspecified: Secondary | ICD-10-CM

## 2016-05-21 DIAGNOSIS — T8189XD Other complications of procedures, not elsewhere classified, subsequent encounter: Secondary | ICD-10-CM

## 2016-05-22 ENCOUNTER — Ambulatory Visit (INDEPENDENT_AMBULATORY_CARE_PROVIDER_SITE_OTHER): Payer: Medicare Other | Admitting: *Deleted

## 2016-05-22 ENCOUNTER — Other Ambulatory Visit: Payer: Self-pay | Admitting: *Deleted

## 2016-05-22 DIAGNOSIS — T8189XA Other complications of procedures, not elsewhere classified, initial encounter: Secondary | ICD-10-CM

## 2016-05-22 DIAGNOSIS — I4891 Unspecified atrial fibrillation: Secondary | ICD-10-CM

## 2016-05-22 DIAGNOSIS — Z953 Presence of xenogenic heart valve: Secondary | ICD-10-CM

## 2016-05-22 DIAGNOSIS — Z951 Presence of aortocoronary bypass graft: Secondary | ICD-10-CM

## 2016-05-22 DIAGNOSIS — I340 Nonrheumatic mitral (valve) insufficiency: Secondary | ICD-10-CM

## 2016-05-22 LAB — POCT INR: INR: 1.3

## 2016-05-22 NOTE — Progress Notes (Unsigned)
Ct hest 

## 2016-05-27 ENCOUNTER — Other Ambulatory Visit: Payer: Self-pay | Admitting: Cardiovascular Disease

## 2016-05-27 NOTE — Telephone Encounter (Signed)
Review for refill. 

## 2016-05-28 ENCOUNTER — Ambulatory Visit (INDEPENDENT_AMBULATORY_CARE_PROVIDER_SITE_OTHER): Payer: Medicare Other

## 2016-05-28 DIAGNOSIS — I4891 Unspecified atrial fibrillation: Secondary | ICD-10-CM | POA: Diagnosis not present

## 2016-05-28 DIAGNOSIS — Z953 Presence of xenogenic heart valve: Secondary | ICD-10-CM

## 2016-05-28 DIAGNOSIS — I340 Nonrheumatic mitral (valve) insufficiency: Secondary | ICD-10-CM | POA: Diagnosis not present

## 2016-05-28 LAB — POCT INR: INR: 1.8

## 2016-06-03 ENCOUNTER — Ambulatory Visit
Admission: RE | Admit: 2016-06-03 | Discharge: 2016-06-03 | Disposition: A | Discharge status: Home / Self Care | Payer: Medicare Other | Source: Ambulatory Visit | Attending: Thoracic Surgery (Cardiothoracic Vascular Surgery) | Admitting: Thoracic Surgery (Cardiothoracic Vascular Surgery)

## 2016-06-03 ENCOUNTER — Ambulatory Visit (INDEPENDENT_AMBULATORY_CARE_PROVIDER_SITE_OTHER): Payer: Medicare Other | Admitting: Thoracic Surgery (Cardiothoracic Vascular Surgery)

## 2016-06-03 VITALS — BP 138/76 | HR 86 | Resp 20 | Ht 61.5 in | Wt 132.0 lb

## 2016-06-03 DIAGNOSIS — Z8679 Personal history of other diseases of the circulatory system: Secondary | ICD-10-CM

## 2016-06-03 DIAGNOSIS — Z953 Presence of xenogenic heart valve: Secondary | ICD-10-CM

## 2016-06-03 DIAGNOSIS — Z951 Presence of aortocoronary bypass graft: Secondary | ICD-10-CM

## 2016-06-03 DIAGNOSIS — T8189XD Other complications of procedures, not elsewhere classified, subsequent encounter: Secondary | ICD-10-CM

## 2016-06-03 DIAGNOSIS — Z9889 Other specified postprocedural states: Secondary | ICD-10-CM

## 2016-06-03 DIAGNOSIS — I2510 Atherosclerotic heart disease of native coronary artery without angina pectoris: Secondary | ICD-10-CM

## 2016-06-03 DIAGNOSIS — T8189XA Other complications of procedures, not elsewhere classified, initial encounter: Secondary | ICD-10-CM

## 2016-06-03 MED ORDER — IOPAMIDOL (ISOVUE-370) INJECTION 76%
75.0000 mL | Freq: Once | INTRAVENOUS | Status: AC | PRN
  Administered 2016-06-03: 75 mL via INTRAVENOUS

## 2016-06-03 MED ORDER — TRAMADOL HCL 50 MG PO TABS: 50.0000 mg | ORAL_TABLET | Freq: Four times a day (QID) | ORAL | 0 refills | Status: DC | PRN

## 2016-06-03 NOTE — Progress Notes (Signed)
[address]       [place] [address]             [phone]     CARDIOTHORACIC SURGERY OFFICE NOTE  Referring Provider is Wellington Hampshire, MD PCP is Ezequiel Kayser, MD   HPI:  Patient returns to our office today for wound check and follow-up CT scan related to her chronic superficial chest wall wound. She was last seen here in our office on 04/22/2016. Since then she has been followed intermittently by Dr. Marla Roe through the wound care clinic. She returns to our office with her husband present. The small draining wound is now almost completely sealed up. According to the patient's husband there is just a tiny amount of drainage every day and the wound is too small to be packed. Over the past month the patient has experienced some increased in pain along the right anterolateral chest wall. Pain is exacerbated by motion. She has not been taking any sort of pain relievers other than Tylenol. She has not had any fevers or chills. Appetite is good. She has not been having shortness of breath and overall she has remained quite stable from a cardiac standpoint for many months. The remainder of her review systems is unchanged from previously.   Current Outpatient Prescriptions  Medication Sig Dispense Refill  . acetaminophen (TYLENOL) 325 MG tablet Take 2 tablets (650 mg total) by mouth every 4 (four) hours as needed for mild pain (or Fever >/= 101).    Marland Kitchen aspirin EC 81 MG EC tablet Take 1 tablet (81 mg total) by mouth daily.    . bisoprolol (ZEBETA) 10 MG tablet Take 1 tablet (10 mg total) by mouth daily. 30 tablet 5  . ferrous sulfate 325 (65 FE) MG tablet Take 325 mg by mouth daily with breakfast.    . fluticasone (FLONASE) 50 MCG/ACT nasal spray Place 1 spray into both nostrils daily as needed for allergies.     . furosemide (LASIX) 20 MG tablet Take 20 mg by mouth 2 (two) times daily.     Marland Kitchen gabapentin (NEURONTIN) 100 MG capsule Take 100 mg by mouth at bedtime.   1    . levothyroxine (SYNTHROID, LEVOTHROID) 88 MCG tablet Take 88 mcg by mouth daily before breakfast.    . losartan (COZAAR) 100 MG tablet Take 1 tablet (100 mg total) by mouth daily. 30 tablet 3  . Multiple Vitamins-Minerals (PRESERVISION AREDS 2 PO) Take 2 tablets by mouth daily.    . pantoprazole (PROTONIX) 40 MG tablet Take 40 mg by mouth 2 (two) times daily.     . potassium chloride SA (K-DUR,KLOR-CON) 20 MEQ tablet Take 20 mEq by mouth daily.  2  . simvastatin (ZOCOR) 20 MG tablet Take 20 mg by mouth every evening.     Marland Kitchen spironolactone (ALDACTONE) 25 MG tablet Take 1 tablet (25 mg total) by mouth daily. 30 tablet 6  . warfarin (COUMADIN) 2.5 MG tablet TAKE 1 TABLET BY MOUTH AS DIRECTED 40 tablet 0  . traMADol (ULTRAM) 50 MG tablet Take 1 tablet (50 mg total) by mouth every 6 (six) hours as needed for moderate pain. 40 tablet 0   No current facility-administered medications for this visit.       Physical Exam:   BP 138/76 (BP Location: Left Arm, Patient Position: Sitting, Cuff Size: Small)   Pulse 86   Resp 20   Ht 5' 1.5" (1.562 m)   Wt 132 lb (  59.9 kg)   SpO2 94%   BMI 24.54 kg/m   General:  Elderly and frail but well-appearing  Chest:   Clear to auscultation with symmetrical breath sounds  CV:   Regular rate and rhythm  Incisions:  The open wound of the right anterolateral chest appears to have nearly completely healed. There is a very tiny area that is superficial that has not completely finished healing. This area cannot be probed with a swab and essentially is just the epithelial coverage. The patient is tender on palpation of the right costal margin. There is no fluctuance. There is no motion. There is no overlying erythema.  Abdomen:  Soft and nontender  Extremities:  Warm and well-perfused  Diagnostic Tests:  CT ANGIOGRAPHY CHEST WITH CONTRAST  TECHNIQUE: Multidetector CT imaging of the chest was performed using the standard protocol during bolus administration of  intravenous contrast. Multiplanar CT image reconstructions and MIPs were obtained to evaluate the vascular anatomy.  CONTRAST:  75 cc Isovue 370  COMPARISON:  CT CAP 03/28/2016  FINDINGS: Cardiovascular: Heart is enlarged. No large pericardial effusion. Main pulmonary artery is dilated measuring 3.1 cm. Patient status post CABG procedure. Retained epicardial leads demonstrated within the pericardium inferiorly. Atherosclerotic plaque involving the thoracic aorta. Adequate opacification of the pulmonary arterial system. No evidence for pulmonary embolism.  Mediastinum/Nodes: No enlarged axillary, mediastinal or hilar lymphadenopathy. Esophagus is unremarkable.  Lungs/Pleura: Central airways are patent. Centrilobular emphysematous changes. Unchanged 2 mm right upper lobe calcified granuloma. Unchanged 3 mm left upper lobe pulmonary nodule (image 38; series 5). This is likely benign given stability over time. Minimal scarring and or atelectasis within the lingula. No pleural effusion or pneumothorax.  Upper Abdomen: Grossly unchanged 7 mm peripherally enhancing lesion within the right hepatic lobe (image 120; series 4) and 13 mm peripheral enhancing lesion within the hepatic dome (image 90; series 4). Stable thickening of the adrenal glands bilaterally. Multiple calcified granulomata in the spleen.  Musculoskeletal: Thoracic spine degenerative changes. Patient status post median sternotomy. Within the right parasternal soft tissues inferiorly there is persistent soft tissue thickening and subcutaneous fat stranding. This appears to be located at the level of the sixth rib costocartilage anteriorly. No evidence for fluid collection.  Review of the MIP images confirms the above findings.  IMPRESSION: No evidence for pulmonary embolus.  Re- demonstrated right lower parasternal wound with skin thickening and subcutaneous fat stranding, most compatible with cellulitis.  No large fluid collection identified.  Indeterminate grossly stable peripherally enhancing lesions within the liver, potentially representing hemangiomas or perfusion anomalies. Recommend attention on followup.   Electronically Signed   By: Lovey Newcomer M.D.   On: 06/03/2016 14:55    Impression:  The patient's chronic wound along the right anterolateral chest wall appears to be healing. She does experience significant pain overlying the costal margin, in particular the sixth costal cartilage. I have personally reviewed the follow-up CT scan performed today. There remains some signs of inflammation and stranding overlying the sixth costal cartilage but there is no fluid collection and no sign of infection. Overall I feel this is encouraging and there is clearly no indication for any kind of surgical drainage or debridement. Whether or not this will finish healing without further complication remains to be seen.    Plan:  We have not recommended any changes to the patient's current medical therapy. The patient will continue to follow-up with Dr. Marla Roe through the wound clinic. I have advised the patient to avoid any sort  of lifting, pulling, or pushing with her arms or shoulders for at least the next 3-6 months. In particular, she should avoid reaching up above her head with the right arm. All of her questions have been addressed. The patient will return to our office for follow-up and wound check in 3 months.  She will call and return sooner should specific problems or difficulties arise.  I spent in excess of 15 minutes during the conduct of this office consultation and >50% of this time involved direct face-to-face encounter with the patient for counseling and/or coordination of their care.    Valentina Gu. Roxy Manns, MD 06/03/2016 3:26 PM

## 2016-06-03 NOTE — Patient Instructions (Signed)
Continue to avoid any lifting or pulling or pushing with your arms or shoulders or any other movements that cause increased pain in your chest

## 2016-06-05 ENCOUNTER — Ambulatory Visit (INDEPENDENT_AMBULATORY_CARE_PROVIDER_SITE_OTHER): Payer: Medicare Other

## 2016-06-05 DIAGNOSIS — I340 Nonrheumatic mitral (valve) insufficiency: Secondary | ICD-10-CM | POA: Diagnosis not present

## 2016-06-05 DIAGNOSIS — I4891 Unspecified atrial fibrillation: Secondary | ICD-10-CM | POA: Diagnosis not present

## 2016-06-05 DIAGNOSIS — Z953 Presence of xenogenic heart valve: Secondary | ICD-10-CM | POA: Diagnosis not present

## 2016-06-05 LAB — POCT INR: INR: 1.8

## 2016-06-07 ENCOUNTER — Other Ambulatory Visit: Payer: Medicare Other

## 2016-06-19 ENCOUNTER — Ambulatory Visit (INDEPENDENT_AMBULATORY_CARE_PROVIDER_SITE_OTHER): Payer: Medicare Other

## 2016-06-19 DIAGNOSIS — I340 Nonrheumatic mitral (valve) insufficiency: Secondary | ICD-10-CM

## 2016-06-19 DIAGNOSIS — I4891 Unspecified atrial fibrillation: Secondary | ICD-10-CM | POA: Diagnosis not present

## 2016-06-19 DIAGNOSIS — Z953 Presence of xenogenic heart valve: Secondary | ICD-10-CM | POA: Diagnosis not present

## 2016-06-19 LAB — POCT INR: INR: 2

## 2016-06-28 ENCOUNTER — Other Ambulatory Visit: Payer: Medicare Other

## 2016-06-29 ENCOUNTER — Emergency Department: Payer: Medicare Other

## 2016-06-29 ENCOUNTER — Encounter: Payer: Self-pay | Admitting: Emergency Medicine

## 2016-06-29 ENCOUNTER — Inpatient Hospital Stay
Admission: EM | Admit: 2016-06-29 | Discharge: 2016-07-01 | DRG: 189 | Disposition: A | Discharge status: Home / Self Care | Payer: Medicare Other | Attending: Internal Medicine | Admitting: Internal Medicine

## 2016-06-29 DIAGNOSIS — Z853 Personal history of malignant neoplasm of breast: Secondary | ICD-10-CM

## 2016-06-29 DIAGNOSIS — J209 Acute bronchitis, unspecified: Secondary | ICD-10-CM | POA: Diagnosis present

## 2016-06-29 DIAGNOSIS — Z953 Presence of xenogenic heart valve: Secondary | ICD-10-CM

## 2016-06-29 DIAGNOSIS — E785 Hyperlipidemia, unspecified: Secondary | ICD-10-CM | POA: Diagnosis present

## 2016-06-29 DIAGNOSIS — I5023 Acute on chronic systolic (congestive) heart failure: Secondary | ICD-10-CM | POA: Diagnosis present

## 2016-06-29 DIAGNOSIS — Z8701 Personal history of pneumonia (recurrent): Secondary | ICD-10-CM | POA: Diagnosis not present

## 2016-06-29 DIAGNOSIS — J9811 Atelectasis: Secondary | ICD-10-CM | POA: Diagnosis present

## 2016-06-29 DIAGNOSIS — Z7901 Long term (current) use of anticoagulants: Secondary | ICD-10-CM | POA: Diagnosis not present

## 2016-06-29 DIAGNOSIS — I480 Paroxysmal atrial fibrillation: Secondary | ICD-10-CM | POA: Diagnosis present

## 2016-06-29 DIAGNOSIS — Z9981 Dependence on supplemental oxygen: Secondary | ICD-10-CM

## 2016-06-29 DIAGNOSIS — R0902 Hypoxemia: Secondary | ICD-10-CM

## 2016-06-29 DIAGNOSIS — Z951 Presence of aortocoronary bypass graft: Secondary | ICD-10-CM

## 2016-06-29 DIAGNOSIS — T501X6A Underdosing of loop [high-ceiling] diuretics, initial encounter: Secondary | ICD-10-CM | POA: Diagnosis present

## 2016-06-29 DIAGNOSIS — Z9011 Acquired absence of right breast and nipple: Secondary | ICD-10-CM

## 2016-06-29 DIAGNOSIS — Z7982 Long term (current) use of aspirin: Secondary | ICD-10-CM

## 2016-06-29 DIAGNOSIS — Y92019 Unspecified place in single-family (private) house as the place of occurrence of the external cause: Secondary | ICD-10-CM

## 2016-06-29 DIAGNOSIS — R0600 Dyspnea, unspecified: Secondary | ICD-10-CM

## 2016-06-29 DIAGNOSIS — I2510 Atherosclerotic heart disease of native coronary artery without angina pectoris: Secondary | ICD-10-CM | POA: Diagnosis present

## 2016-06-29 DIAGNOSIS — Z950 Presence of cardiac pacemaker: Secondary | ICD-10-CM | POA: Diagnosis not present

## 2016-06-29 DIAGNOSIS — K219 Gastro-esophageal reflux disease without esophagitis: Secondary | ICD-10-CM | POA: Diagnosis present

## 2016-06-29 DIAGNOSIS — J441 Chronic obstructive pulmonary disease with (acute) exacerbation: Secondary | ICD-10-CM | POA: Diagnosis present

## 2016-06-29 DIAGNOSIS — Z8249 Family history of ischemic heart disease and other diseases of the circulatory system: Secondary | ICD-10-CM | POA: Diagnosis not present

## 2016-06-29 DIAGNOSIS — Z87891 Personal history of nicotine dependence: Secondary | ICD-10-CM | POA: Diagnosis not present

## 2016-06-29 DIAGNOSIS — E039 Hypothyroidism, unspecified: Secondary | ICD-10-CM | POA: Diagnosis present

## 2016-06-29 DIAGNOSIS — J440 Chronic obstructive pulmonary disease with acute lower respiratory infection: Secondary | ICD-10-CM | POA: Diagnosis present

## 2016-06-29 DIAGNOSIS — I509 Heart failure, unspecified: Secondary | ICD-10-CM

## 2016-06-29 DIAGNOSIS — Z91128 Patient's intentional underdosing of medication regimen for other reason: Secondary | ICD-10-CM

## 2016-06-29 DIAGNOSIS — J9601 Acute respiratory failure with hypoxia: Principal | ICD-10-CM | POA: Diagnosis present

## 2016-06-29 DIAGNOSIS — I110 Hypertensive heart disease with heart failure: Secondary | ICD-10-CM | POA: Diagnosis present

## 2016-06-29 DIAGNOSIS — Z9621 Cochlear implant status: Secondary | ICD-10-CM | POA: Diagnosis present

## 2016-06-29 LAB — CBC WITH DIFFERENTIAL/PLATELET
BASOS PCT: 0 %
Basophils Absolute: 0 10*3/uL (ref 0–0.1)
EOS ABS: 0 10*3/uL (ref 0–0.7)
Eosinophils Relative: 0 %
HEMATOCRIT: 31.4 % — AB (ref 35.0–47.0)
HEMOGLOBIN: 10.4 g/dL — AB (ref 12.0–16.0)
LYMPHS ABS: 0.6 10*3/uL — AB (ref 1.0–3.6)
Lymphocytes Relative: 6 %
MCH: 30.6 pg (ref 26.0–34.0)
MCHC: 33.1 g/dL (ref 32.0–36.0)
MCV: 92.3 fL (ref 80.0–100.0)
MONO ABS: 1.1 10*3/uL — AB (ref 0.2–0.9)
MONOS PCT: 10 %
NEUTROS ABS: 9.2 10*3/uL — AB (ref 1.4–6.5)
NEUTROS PCT: 84 %
Platelets: 193 10*3/uL (ref 150–440)
RBC: 3.41 MIL/uL — ABNORMAL LOW (ref 3.80–5.20)
RDW: 20.8 % — AB (ref 11.5–14.5)
WBC: 10.9 10*3/uL (ref 3.6–11.0)

## 2016-06-29 LAB — TROPONIN I: Troponin I: 0.03 ng/mL (ref <0.03)

## 2016-06-29 LAB — BASIC METABOLIC PANEL
Anion gap: 9 (ref 5–15)
BUN: 16 mg/dL (ref 6–20)
CALCIUM: 9.1 mg/dL (ref 8.9–10.3)
CHLORIDE: 106 mmol/L (ref 101–111)
CO2: 26 mmol/L (ref 22–32)
CREATININE: 0.96 mg/dL (ref 0.44–1.00)
GFR calc Af Amer: >60 mL/min (ref >60)
GFR calc non Af Amer: 55 mL/min — ABNORMAL LOW (ref >60)
GLUCOSE: 152 mg/dL — AB (ref 65–99)
Potassium: 4.4 mmol/L (ref 3.5–5.1)
Sodium: 141 mmol/L (ref 135–145)

## 2016-06-29 LAB — PROTIME-INR
INR: 2.85
Prothrombin Time: 30.5 seconds — ABNORMAL HIGH (ref 11.4–15.2)

## 2016-06-29 LAB — BRAIN NATRIURETIC PEPTIDE: B NATRIURETIC PEPTIDE 5: 1451 pg/mL — AB (ref 0.0–100.0)

## 2016-06-29 MED ORDER — FERROUS SULFATE 325 (65 FE) MG PO TABS
325.0000 mg | ORAL_TABLET | Freq: Every day | ORAL | Status: DC
  Administered 2016-06-30 – 2016-07-01 (×2): 325 mg via ORAL
  Filled 2016-06-29 (×2): qty 1

## 2016-06-29 MED ORDER — LOSARTAN POTASSIUM 50 MG PO TABS
100.0000 mg | ORAL_TABLET | Freq: Every day | ORAL | Status: DC
  Administered 2016-06-30 – 2016-07-01 (×2): 100 mg via ORAL
  Filled 2016-06-29 (×2): qty 2

## 2016-06-29 MED ORDER — IPRATROPIUM-ALBUTEROL 0.5-2.5 (3) MG/3ML IN SOLN: 3.0000 mL | Freq: Four times a day (QID) | RESPIRATORY_TRACT | Status: DC | PRN

## 2016-06-29 MED ORDER — FUROSEMIDE 10 MG/ML IJ SOLN
20.0000 mg | Freq: Two times a day (BID) | INTRAMUSCULAR | Status: DC
  Administered 2016-06-29 – 2016-07-01 (×4): 20 mg via INTRAVENOUS
  Filled 2016-06-29 (×4): qty 2

## 2016-06-29 MED ORDER — FLUTICASONE PROPIONATE 50 MCG/ACT NA SUSP
1.0000 | Freq: Every day | NASAL | Status: DC | PRN
  Filled 2016-06-29: qty 16

## 2016-06-29 MED ORDER — ACETAMINOPHEN 325 MG PO TABS
650.0000 mg | ORAL_TABLET | Freq: Four times a day (QID) | ORAL | Status: DC | PRN
Start: 2016-06-29 — End: 2016-07-01

## 2016-06-29 MED ORDER — PREDNISONE 20 MG PO TABS: 20.0000 mg | ORAL_TABLET | Freq: Every day | ORAL | Status: DC

## 2016-06-29 MED ORDER — AZITHROMYCIN 250 MG PO TABS
500.0000 mg | ORAL_TABLET | Freq: Every day | ORAL | Status: AC
  Administered 2016-06-29: 500 mg via ORAL
  Filled 2016-06-29: qty 2

## 2016-06-29 MED ORDER — PANTOPRAZOLE SODIUM 40 MG PO TBEC
40.0000 mg | DELAYED_RELEASE_TABLET | Freq: Two times a day (BID) | ORAL | Status: DC
  Administered 2016-06-29 – 2016-07-01 (×4): 40 mg via ORAL
  Filled 2016-06-29 (×4): qty 1

## 2016-06-29 MED ORDER — SODIUM CHLORIDE 0.9% FLUSH
3.0000 mL | Freq: Two times a day (BID) | INTRAVENOUS | Status: DC
  Administered 2016-06-29 – 2016-07-01 (×4): 3 mL via INTRAVENOUS

## 2016-06-29 MED ORDER — WARFARIN SODIUM 5 MG PO TABS: 2.5000 mg | ORAL_TABLET | Freq: Every day | ORAL | Status: DC

## 2016-06-29 MED ORDER — SIMVASTATIN 20 MG PO TABS
20.0000 mg | ORAL_TABLET | Freq: Every day | ORAL | Status: DC
  Administered 2016-06-29 – 2016-06-30 (×2): 20 mg via ORAL
  Filled 2016-06-29 (×2): qty 1

## 2016-06-29 MED ORDER — ONDANSETRON HCL 4 MG/2ML IJ SOLN
4.0000 mg | Freq: Four times a day (QID) | INTRAMUSCULAR | Status: DC | PRN
Start: 2016-06-29 — End: 2016-07-01

## 2016-06-29 MED ORDER — PREDNISONE 10 MG PO TABS: 10.0000 mg | ORAL_TABLET | Freq: Every day | ORAL | Status: DC

## 2016-06-29 MED ORDER — ASPIRIN EC 81 MG PO TBEC
81.0000 mg | DELAYED_RELEASE_TABLET | Freq: Every day | ORAL | Status: DC
  Administered 2016-06-30 – 2016-07-01 (×2): 81 mg via ORAL
  Filled 2016-06-29 (×2): qty 1

## 2016-06-29 MED ORDER — BISOPROLOL FUMARATE 10 MG PO TABS
10.0000 mg | ORAL_TABLET | Freq: Every day | ORAL | Status: DC
  Administered 2016-06-30 – 2016-07-01 (×2): 10 mg via ORAL
  Filled 2016-06-29 (×2): qty 1

## 2016-06-29 MED ORDER — TRAMADOL HCL 50 MG PO TABS: 50.0000 mg | ORAL_TABLET | Freq: Four times a day (QID) | ORAL | Status: DC | PRN

## 2016-06-29 MED ORDER — BUDESONIDE 0.25 MG/2ML IN SUSP
0.2500 mg | Freq: Two times a day (BID) | RESPIRATORY_TRACT | Status: DC
  Administered 2016-06-29 – 2016-07-01 (×4): 0.25 mg via RESPIRATORY_TRACT
  Filled 2016-06-29 (×5): qty 2

## 2016-06-29 MED ORDER — BENZONATATE 100 MG PO CAPS
100.0000 mg | ORAL_CAPSULE | Freq: Three times a day (TID) | ORAL | Status: DC | PRN
  Administered 2016-06-29 – 2016-06-30 (×3): 100 mg via ORAL
  Filled 2016-06-29 (×3): qty 1

## 2016-06-29 MED ORDER — ONDANSETRON HCL 4 MG PO TABS: 4.0000 mg | ORAL_TABLET | Freq: Four times a day (QID) | ORAL | Status: DC | PRN

## 2016-06-29 MED ORDER — AZITHROMYCIN 250 MG PO TABS
250.0000 mg | ORAL_TABLET | Freq: Every day | ORAL | Status: DC
  Administered 2016-06-30 – 2016-07-01 (×2): 250 mg via ORAL
  Filled 2016-06-29 (×2): qty 1

## 2016-06-29 MED ORDER — POTASSIUM CHLORIDE CRYS ER 20 MEQ PO TBCR
20.0000 meq | EXTENDED_RELEASE_TABLET | Freq: Every day | ORAL | Status: DC
  Administered 2016-06-30 – 2016-07-01 (×2): 20 meq via ORAL
  Filled 2016-06-29 (×2): qty 1

## 2016-06-29 MED ORDER — SPIRONOLACTONE 25 MG PO TABS
25.0000 mg | ORAL_TABLET | Freq: Every day | ORAL | Status: DC
  Administered 2016-06-30 – 2016-07-01 (×2): 25 mg via ORAL
  Filled 2016-06-29 (×2): qty 1

## 2016-06-29 MED ORDER — ACETAMINOPHEN 650 MG RE SUPP: 650.0000 mg | Freq: Four times a day (QID) | RECTAL | Status: DC | PRN

## 2016-06-29 MED ORDER — PREDNISONE 20 MG PO TABS
40.0000 mg | ORAL_TABLET | Freq: Every day | ORAL | Status: DC
  Administered 2016-06-30: 40 mg via ORAL
  Filled 2016-06-29: qty 2

## 2016-06-29 MED ORDER — GABAPENTIN 100 MG PO CAPS
200.0000 mg | ORAL_CAPSULE | Freq: Every day | ORAL | Status: DC
  Administered 2016-06-29 – 2016-06-30 (×2): 200 mg via ORAL
  Filled 2016-06-29 (×2): qty 2

## 2016-06-29 MED ORDER — WARFARIN - PHYSICIAN DOSING INPATIENT: Freq: Every day | Status: DC

## 2016-06-29 MED ORDER — PREDNISONE 20 MG PO TABS
30.0000 mg | ORAL_TABLET | Freq: Every day | ORAL | Status: DC
  Administered 2016-07-01: 30 mg via ORAL
  Filled 2016-06-29: qty 1

## 2016-06-29 MED ORDER — WARFARIN SODIUM 2.5 MG PO TABS
3.7500 mg | ORAL_TABLET | ORAL | Status: DC
  Administered 2016-06-30: 3.75 mg via ORAL
  Filled 2016-06-29: qty 1

## 2016-06-29 MED ORDER — LEVOTHYROXINE SODIUM 88 MCG PO TABS
88.0000 ug | ORAL_TABLET | Freq: Every day | ORAL | Status: DC
  Administered 2016-06-30 – 2016-07-01 (×2): 88 ug via ORAL
  Filled 2016-06-29 (×2): qty 1

## 2016-06-29 MED ORDER — PREDNISONE 50 MG PO TABS
50.0000 mg | ORAL_TABLET | Freq: Every day | ORAL | Status: DC
  Administered 2016-06-29: 50 mg via ORAL
  Filled 2016-06-29: qty 1

## 2016-06-29 MED ORDER — FUROSEMIDE 10 MG/ML IJ SOLN
40.0000 mg | Freq: Once | INTRAMUSCULAR | Status: AC
  Administered 2016-06-29: 40 mg via INTRAVENOUS
  Filled 2016-06-29: qty 4

## 2016-06-29 MED ORDER — GUAIFENESIN-DM 100-10 MG/5ML PO SYRP
5.0000 mL | ORAL_SOLUTION | ORAL | Status: DC | PRN
  Administered 2016-06-29 – 2016-06-30 (×2): 5 mL via ORAL
  Filled 2016-06-29 (×2): qty 5

## 2016-06-29 MED ORDER — WARFARIN SODIUM 5 MG PO TABS
2.5000 mg | ORAL_TABLET | ORAL | Status: DC
  Administered 2016-06-29: 2.5 mg via ORAL
  Filled 2016-06-29: qty 1

## 2016-06-29 NOTE — ED Notes (Signed)
Helped pt to the bathroom 

## 2016-06-29 NOTE — ED Provider Notes (Signed)
[facility] Emergency Department Provider Note  ____________________________________________   First MD Initiated Contact with Patient 06/29/16 [phone]     (approximate)  I have reviewed the triage vital signs and the nursing notes.   HISTORY  Chief Complaint Shortness of Breath   HPI [name] is a 81 y.o. female with a history of CHF as well as CAD status post CABG and mild COPD wearing oxygen only at night who is presenting with increased shortness of breath over the past week. She says that she thinks she has a cold which started as a cough with runny nose this past Monday. She says that she had chills for several days that has not any chills over the past 3 days. No documented fever. Also saying that she has right-sided sharp chest pain which is chronic since having multiple surgeries after a wound infection after her bypass. She says the right-sided chest pain is under her right arm along the right thorax and sharp, especially when she coughs. Denies any known sick contacts. Says that she is having difficulty walking and was 84% on room air in triage.   Past Medical History:  Diagnosis Date  . Anxiety   . Arthritis    "hands" (11/10/2015)  . CAD s/p CABG x 1    a. 04/2015 LIMA to LAD  . Cancer of right breast ([place]) 08/09/2001  . Chronic diastolic CHF (congestive heart failure) ([place])   . COPD (chronic obstructive pulmonary disease) ([place])   . COPD (chronic obstructive pulmonary disease) ([place])   . GERD (gastroesophageal reflux disease)   . Hyperlipidemia   . Hypertension   . Hypothyroidism   . 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 04/2015 Complete bilateral atrial lesion set using cryothermy and bipolar radiofrequency ablation with clipping of LA appendage  . Meniere disease   . On home oxygen therapy    "2L at night" (11/10/2015)  . PAF (paroxysmal atrial fibrillation) ([place])   . Pneumonia ~ 2010  . PONV (postoperative nausea and vomiting)    Pt felt like eardrums were gonna pop  . Post-surgical complete heart block, symptomatic    a. 04/2015 s/p MDT [id number] Advisa DR MRI DC PPM.  . Presence of permanent cardiac pacemaker   . Pulmonary hypertension   . Severe Mitral Regurgitation s/p MV Repair    a. 04/2015 s/p 27 mm [facility] Mitral bovine bioprosthetic tissue valve  . Surgical wound, non healing - chest wall 11/10/2015  . Tricuspid Regurgitation s/p Repair    a. 04/2015 s/p 26 mm Edwards mc3 ring annuloplasty  . Wound infection 10/13/2015   Superficial sternal wound infection    Patient Active Problem List   Diagnosis Date Noted  . Surgical wound, non healing - chest wall 11/10/2015  . Chronic diastolic CHF (congestive heart failure) ([place])   . S/P placement of cardiac pacemaker 05/02/15, medtronic 05/03/2015  . Bradycardia 05/03/2015  . Post-surgical complete heart block, symptomatic 05/03/2015  . S/P mitral valve replacement with bioprosthetic valve, tricuspid valve repair, maze procedure and CABG x1 04/25/2015  . S/P tricuspid valve repair 04/25/2015  . S/P Maze operation for atrial fibrillation 04/25/2015  . S/P CABG x 1 04/25/2015  . Coronary artery disease   . Severe mitral regurgitation 03/09/2015  . Chronic systolic congestive heart failure ([place]) 03/09/2015  . Pulmonary hypertension   . Tricuspid regurgitation   . Chronic combined systolic and diastolic CHF (congestive heart failure) ([place])   .  Atrial fibrillation, unspecified 11/10/2014  . Chronic systolic heart failure ([place]) 11/10/2014  . Essential hypertension 11/10/2014  . Mitral regurgitation 11/10/2014  . Hyperlipidemia 11/10/2014  . Disorder of mitral valve 11/10/2014  . Atrial fibrillation ([place]) 11/10/2014  . Acid reflux 05/10/2014  . Type 2 diabetes mellitus ([place]) 05/08/2014  . Auditory vertigo 05/08/2014  . Adult hypothyroidism 05/08/2014  . Hypercholesterolemia 05/08/2014  . H/O adenomatous polyp of colon 05/08/2014  . Benign essential HTN  05/08/2014  . Moderate COPD (chronic obstructive pulmonary disease) ([place]) 03/28/2014    Past Surgical History:  Procedure Laterality Date  . APPLICATION OF A-CELL OF CHEST/ABDOMEN N/A 11/21/2015   Procedure: APPLICATION OF A-CELL OF CHEST/ABDOMEN;  Surgeon: Loel Lofty Dillingham, DO;  Location: [place];  Service: Plastics;  Laterality: N/A;  . APPLICATION OF A-CELL OF CHEST/ABDOMEN Right 12/21/2015   Procedure: APPLICATION OF A-CELL OF RIGHT CHEST;  Surgeon: Loel Lofty Dillingham, DO;  Location: [place];  Service: Plastics;  Laterality: Right;  . APPLICATION OF A-CELL OF CHEST/ABDOMEN Right 01/11/2016   Procedure: APPLICATION OF A-CELL OF RIGHT CHEST;  Surgeon: Loel Lofty Dillingham, DO;  Location: [place];  Service: Plastics;  Laterality: Right;  . APPLICATION OF A-CELL OF CHEST/ABDOMEN Right 02/12/2016   Procedure: APPLICATION OF A-CELL TO RIGHT CHEST WOUND;  Surgeon: Loel Lofty Dillingham, DO;  Location: [place];  Service: Plastics;  Laterality: Right;  . APPLICATION OF WOUND VAC N/A 05/09/2015   Procedure: APPLICATION OF WOUND VAC;  Surgeon: Rexene Alberts, MD;  Location: [place];  Service: Thoracic;  Laterality: N/A;  . APPLICATION OF WOUND VAC N/A 11/13/2015   Procedure: APPLICATION OF WOUND VAC;  Surgeon: Rexene Alberts, MD;  Location: [place];  Service: Thoracic;  Laterality: N/A;  . APPLICATION OF WOUND VAC N/A 11/21/2015   Procedure: APPLICATION OF WOUND VAC;  Surgeon: Loel Lofty Dillingham, DO;  Location: [place];  Service: Plastics;  Laterality: N/A;  . APPLICATION OF WOUND VAC Right 12/21/2015   Procedure: APPLICATION OF WOUND VAC to right chest wall ;  Surgeon: Loel Lofty Dillingham, DO;  Location: [place];  Service: Plastics;  Laterality: Right;  . APPLICATION OF WOUND VAC Right 01/11/2016   Procedure: APPLICATION OF WOUND VAC RIGHT CHEST ;  Surgeon: Loel Lofty Dillingham, DO;  Location: [place];  Service: Plastics;  Laterality: Right;  . APPLICATION OF WOUND VAC Right 01/24/2016   Procedure: APPLICATION OF WOUND VAC  RIGHT CHEST WALL;  Surgeon: Loel Lofty Dillingham, DO;  Location: [place];  Service: Plastics;  Laterality: Right;  . APPLICATION OF WOUND VAC Right 02/12/2016   Procedure: RE-APPLICATION OF WOUND VAC TO RIGHT CHEST WOUND;  Surgeon: Loel Lofty Dillingham, DO;  Location: [place];  Service: Plastics;  Laterality: Right;  . BREAST BIOPSY Left 11/25/06   neg  . BREAST BIOPSY Left 01/20/12   /clip-neg  . CARDIAC CATHETERIZATION  11/2013   [facility]  . CARDIAC CATHETERIZATION  10/2014   [facility]  . CARDIAC VALVE REPLACEMENT    . CATARACT EXTRACTION W/ INTRAOCULAR LENS  IMPLANT, BILATERAL Bilateral 2014  . CLIPPING OF ATRIAL APPENDAGE N/A 04/25/2015   Procedure: CLIPPING OF ATRIAL APPENDAGE;  Surgeon: Rexene Alberts, MD;  Location: [place];  Service: Open Heart Surgery;  Laterality: N/A;  . COCHLEAR IMPLANT Left 2005?  . CORONARY ARTERY BYPASS GRAFT N/A 04/25/2015   Procedure: CORONARY ARTERY BYPASS GRAFTING (CABG) x ONE, using left internal mammary artery;  Surgeon: Rexene Alberts, MD;  Location: [place];  Service: Open  Heart Surgery;  Laterality: N/A;  . DILATION AND CURETTAGE OF UTERUS  "several before hysterectomy"  . EP IMPLANTABLE DEVICE N/A 05/02/2015   Procedure: Pacemaker Implant;  Surgeon: Thompson Grayer, MD;  Location: [place] [facility];  Service: Cardiovascular;  Laterality: N/A;  . I&D EXTREMITY Right 12/06/2015   Procedure: IRRIGATION AND DEBRIDEMENT RIGHT CHEST WALL WITH ACELL PLACEMENT AND VAC;  Surgeon: Loel Lofty Dillingham, DO;  Location: [place];  Service: Plastics;  Laterality: Right;  . INCISION AND DRAINAGE OF WOUND N/A 11/21/2015   Procedure: IRRIGATION AND DEBRIDEMENT WOUND;  Surgeon: Loel Lofty Dillingham, DO;  Location: [place];  Service: Plastics;  Laterality: N/A;  . INCISION AND DRAINAGE OF WOUND Right 12/21/2015   Procedure: IRRIGATION AND DEBRIDEMENT right chest wall WOUND;  Surgeon: Loel Lofty Dillingham, DO;  Location: [place];  Service: Plastics;  Laterality: Right;  right chest wall   . INCISION AND  DRAINAGE OF WOUND Right 01/24/2016   Procedure: IRRIGATION AND DEBRIDEMENT RIGHT CHEST WALL WOUND;  Surgeon: Loel Lofty Dillingham, DO;  Location: [place];  Service: Plastics;  Laterality: Right;  . INCISION AND DRAINAGE OF WOUND Right 03/28/2016   Procedure: IRRIGATION AND DEBRIDEMENT RIGHT CHEST WALL WOUND WITH A Cell Placement;  Surgeon: Loel Lofty Dillingham, DO;  Location: [place];  Service: Plastics;  Laterality: Right;  . INCISION AND DRAINAGE OF WOUND Right 05/17/2016   Procedure: IRRIGATION AND DEBRIDEMENT OF RIGHT CHEST WOUND WITH A CELL PLACEMENT;  Surgeon: Loel Lofty Dillingham, DO;  Location: [place];  Service: Plastics;  Laterality: Right;  . INSERT / REPLACE / REMOVE PACEMAKER    . IRRIGATION AND DEBRIDEMENT OF WOUND WITH SPLIT THICKNESS SKIN GRAFT Right 01/11/2016   Procedure: IRRIGATION AND DEBRIDEMENT OF RIGHT CHEST WOUND ;  Surgeon: Loel Lofty Dillingham, DO;  Location: [place];  Service: Plastics;  Laterality: Right;  . MASTECTOMY, PARTIAL Right 2002  . MAZE N/A 04/25/2015   Procedure: MAZE;  Surgeon: Rexene Alberts, MD;  Location: [place];  Service: Open Heart Surgery;  Laterality: N/A;  . MITRAL VALVE REPAIR N/A 04/25/2015   Procedure: MITRAL VALVE  REPLACEMENT using a 27 mm Edwards Perimount Magna Mitral Ease Valve;  Surgeon: Rexene Alberts, MD;  Location: [place];  Service: Open Heart Surgery;  Laterality: N/A;  . SKIN SPLIT GRAFT Right 02/12/2016   Procedure: IRRIGATION AND DEBRIDEMENT RIGHT CHEST WOUND;  Surgeon: Loel Lofty Dillingham, DO;  Location: [place];  Service: Plastics;  Laterality: Right;  . STERNAL WIRES REMOVAL N/A 06/05/2015   Procedure: STERNAL WIRES REMOVAL;  Surgeon: Rexene Alberts, MD;  Location: [place];  Service: Thoracic;  Laterality: N/A;  . STERNAL WOUND DEBRIDEMENT N/A 05/09/2015   Procedure: STERNAL WOUND DEBRIDEMENT;  Surgeon: Rexene Alberts, MD;  Location: [place];  Service: Thoracic;  Laterality: N/A;  . STERNAL WOUND DEBRIDEMENT N/A 11/13/2015   Procedure: Excisional drainage of  RIGHT Chest wall mass and breast mass ;  Surgeon: Rexene Alberts, MD;  Location: [place];  Service: Thoracic;  Laterality: N/A;  . TEE WITHOUT CARDIOVERSION N/A 04/25/2015   Procedure: TRANSESOPHAGEAL ECHOCARDIOGRAM (TEE);  Surgeon: Rexene Alberts, MD;  Location: [place];  Service: Open Heart Surgery;  Laterality: N/A;  . TONSILLECTOMY    . TRAM Right 11/18/2015   Procedure: VRAM Vertical Rectus Abdominus Muscle Flap;  Surgeon: Loel Lofty Dillingham, DO;  Location: [place];  Service: Plastics;  Laterality: Right;  RIght Back  . TRICUSPID VALVE REPLACEMENT N/A 04/25/2015   Procedure:  TRICUSPID VALVE REPAIR;  Surgeon: Rexene Alberts, MD;  Location: [place];  Service: Open Heart Surgery;  Laterality: N/A;  . VAGINAL HYSTERECTOMY      Prior to Admission medications   Medication Sig Start Date End Date Taking? Authorizing Provider  acetaminophen (TYLENOL) 325 MG tablet Take 2 tablets (650 mg total) by mouth every 4 (four) hours as needed for mild pain (or Fever >/= 101). 05/11/15  Yes Erin R Barrett, PA-C  aspirin EC 81 MG EC tablet Take 1 tablet (81 mg total) by mouth daily. 05/11/15  Yes Erin R Barrett, PA-C  bisoprolol (ZEBETA) 10 MG tablet Take 1 tablet (10 mg total) by mouth daily. 06/01/15  Yes Wellington Hampshire, MD  ferrous sulfate 325 (65 FE) MG tablet Take 325 mg by mouth daily with breakfast.   Yes Historical Provider, MD  fluticasone (FLONASE) 50 MCG/ACT nasal spray Place 1 spray into both nostrils daily as needed for allergies.    Yes Historical Provider, MD  furosemide (LASIX) 20 MG tablet Take 20 mg by mouth 2 (two) times daily.    Yes Historical Provider, MD  gabapentin (NEURONTIN) 100 MG capsule Take 200 mg by mouth at bedtime.  09/29/15  Yes Historical Provider, MD  levothyroxine (SYNTHROID, LEVOTHROID) 88 MCG tablet Take 88 mcg by mouth daily before breakfast.   Yes Historical Provider, MD  losartan (COZAAR) 100 MG tablet Take 1 tablet (100 mg total) by mouth daily. 08/31/15  Yes Wellington Hampshire,  MD  Multiple Vitamins-Minerals (PRESERVISION AREDS 2 PO) Take 2 tablets by mouth daily.   Yes Historical Provider, MD  pantoprazole (PROTONIX) 40 MG tablet Take 40 mg by mouth 2 (two) times daily.    Yes Historical Provider, MD  potassium chloride SA (K-DUR,KLOR-CON) 20 MEQ tablet Take 20 mEq by mouth daily. 09/27/15  Yes Historical Provider, MD  simvastatin (ZOCOR) 20 MG tablet Take 20 mg by mouth at bedtime.    Yes Historical Provider, MD  spironolactone (ALDACTONE) 25 MG tablet Take 1 tablet (25 mg total) by mouth daily. 12/12/14  Yes Wellington Hampshire, MD  traMADol (ULTRAM) 50 MG tablet Take 1 tablet (50 mg total) by mouth every 6 (six) hours as needed for moderate pain. 06/03/16  Yes Rexene Alberts, MD  warfarin (COUMADIN) 2.5 MG tablet Take 2.5-3.75 mg by mouth at bedtime. 3.75 mg at bedtime on Tuesday, Wednesday, Thursday, Friday, and Sunday 2.5 mg at bedtime on Monday and Saturday   Yes Historical Provider, MD    Allergies Ace inhibitors; Augmentin [amoxicillin-pot clavulanate]; Penicillins; Nifedipine; Propranolol; Diazepam; Meperidine; and Propoxyphene  Family History  Problem Relation Age of Onset  . Heart disease Father   . Heart disease Brother   . Heart attack Paternal Uncle     Social History Social History  Substance Use Topics  . Smoking status: Former Smoker    Packs/day: 1.50    Years: 40.00    Types: Cigarettes    Quit date: 04/20/1998  . Smokeless tobacco: Never Used  . Alcohol use Yes     Comment: 11/10/2015 "glass of wine a few times/year, if that"    Review of Systems Constitutional: No fever/chills Eyes: No visual changes. ENT: No sore throat. Cardiovascular:As above Respiratory: As above Gastrointestinal: No abdominal pain.  No nausea, no vomiting.  No diarrhea.  No constipation. Genitourinary: Negative for dysuria. Musculoskeletal: Negative for back pain. Skin: Negative for rash. Neurological: Negative for headaches, focal weakness or  numbness.  10-point ROS otherwise negative.  ____________________________________________   PHYSICAL EXAM:  VITAL SIGNS: ED Triage Vitals  Enc Vitals Group     BP 06/29/16 0844 131/64     Pulse Rate 06/29/16 0844 92     Resp 06/29/16 0844 (!) 30     Temp 06/29/16 0845 98.5 F (36.9 C)     Temp Source 06/29/16 0845 Oral     SpO2 06/29/16 0844 (!) 84 %     Weight 06/29/16 0825 130 lb (59 kg)     Height 06/29/16 0825 5\' 2"  (1.575 m)     Head Circumference --      Peak Flow --      Pain Score 06/29/16 0826 0     Pain Loc --      Pain Edu? --      Excl. in [place]? --     Constitutional: Alert and oriented. Well appearing and in no acute distress. Eyes: Conjunctivae are normal. PERRL. EOMI. Head: Atraumatic. Nose: No congestion/rhinnorhea. Mouth/Throat: Mucous membranes are moist.   Neck: No stridor.   Cardiovascular: Normal rate, irregularly irregular rhythm. Grossly normal heart sounds.  Tenderness palpation over the right thorax is with visible palpation. Also with small amount of drainage to midline wound which the patient says she receives chronic wound care for and this is improving. No surrounding erythema, induration. Respiratory: Mild tachypnea with slightly labored respirations. Rales in the mid fields through down to the lower fields bilaterally. No wheezing. Gastrointestinal: Soft and nontender. No distention.  Musculoskeletal: No lower extremity tenderness nor edema.  No joint effusions. Neurologic:  Normal speech and language. No gross focal neurologic deficits are appreciated.  Skin:  Skin is warm, dry and intact. No rash noted. Psychiatric: Mood and affect are normal. Speech and behavior are normal.  ____________________________________________   LABS (all labs ordered are listed, but only abnormal results are displayed)  Labs Reviewed  BRAIN NATRIURETIC PEPTIDE - Abnormal; Notable for the following:       Result Value   B Natriuretic Peptide 1,451.0 (*)     All other components within normal limits  CBC WITH DIFFERENTIAL/PLATELET - Abnormal; Notable for the following:    RBC 3.41 (*)    Hemoglobin 10.4 (*)    HCT 31.4 (*)    RDW 20.8 (*)    Neutro Abs 9.2 (*)    Lymphs Abs 0.6 (*)    Monocytes Absolute 1.1 (*)    All other components within normal limits  TROPONIN I  BASIC METABOLIC PANEL  PROTIME-INR   ____________________________________________  EKG  ED ECG REPORT I, Doran Stabler, the attending physician, personally viewed and interpreted this ECG.   Date: 06/29/2016  EKG Time: 0839  Rate: 80  Rhythm: atrial fibrillation, rate 80  Axis: Normal  Intervals:Prolonged QT interval at 5:15  ST&T Change: No ST segment elevation or depression. T-wave inversions in leads 3, aVF as well as V3. No significant changes from EKGs of July 20 as well as 01/04/2016. ____________________________________________  RADIOLOGY  DG Chest 2 View (Accession [id number]) (Order [id number])  Imaging  Date: 06/29/2016 Department: [facility] EMERGENCY DEPARTMENT Released By/Authorizing: Orbie Pyo, MD (auto-released)  Exam Information   Status Exam Begun  Exam Ended   Final [99] 06/29/2016 8:56 AM 06/29/2016 9:03 AM  PACS Images   Show images for DG Chest 2 View  Study Result   CLINICAL DATA:  Wheezing  EXAM: CHEST  2 VIEW  COMPARISON:  08/17/2015  FINDINGS: Cardiac shadow remains enlarged. Postsurgical  changes are seen. Pacing device is again noted. Calcified granuloma is noted in the right middle lobe. Some minimal right upper lobe atelectasis is noted. Patchy changes are seen in the left lung base also consistent with atelectasis. No sizable effusion is noted.  IMPRESSION: Patchy bilateral atelectasis.   Electronically Signed   By: Inez Catalina M.D.   On: 06/29/2016 09:07    ____________________________________________   PROCEDURES  Procedure(s) performed:    Procedures  Critical Care performed:   ____________________________________________   INITIAL IMPRESSION / ASSESSMENT AND PLAN / ED COURSE  Pertinent labs & imaging results that were available during my care of the patient were reviewed by me and considered in my medical decision making (see chart for details).  ----------------------------------------- 9:25 AM on 06/29/2016 -----------------------------------------  I discussed the case with Dr. Golden Circle of radiology who says that the x-ray does appear as atelectasis but that she has rales it is possible it could be CHF as well.  Clinical Course   ----------------------------------------- 10:01 AM on 06/29/2016 -----------------------------------------  Patient with likely CHF. We'll give IV Lasix. Pending BNP as well as troponin.  Patient aware of need for permission to the hospital. We'll hold antibiotics for now. Patient examines more consistent with CHF and a normal white count and no fever here at the hospital and without any documented fever at home. Patient aware of need for [facility] as well as her husband and they're willing to comply. Signed out to Dr. Posey Pronto.  ____________________________________________   FINAL CLINICAL IMPRESSION(S) / ED DIAGNOSES  Hypoxia. Dyspnea. CHF.    NEW MEDICATIONS STARTED DURING THIS VISIT:  New Prescriptions   No medications on file     Note:  This document was prepared using Dragon voice recognition software and may include unintentional dictation errors.    Orbie Pyo, MD 06/29/16 1002

## 2016-06-29 NOTE — H&P (Signed)
[facility] NAME: [name]    MR#:  [id number]  DATE OF BIRTH:  [birth year]  DATE OF ADMISSION:  06/29/2016  PRIMARY CARE PHYSICIAN: Ezequiel Kayser, MD   REQUESTING/REFERRING PHYSICIAN: Dr. Larae Grooms  CHIEF COMPLAINT:   Chief Complaint  Patient presents with  . Shortness of Breath    HISTORY OF PRESENT ILLNESS:  [name]  is a 81 y.o. female with a known history of COPD, systolic CHF, history of breast cancer, history of coronary disease status post bypass, paroxysmal atrial fibrillation, status post mitral valve repair and maze procedure, hypothyroidism, hypertension, hyperlipidemia presents to the hospital due to shortness of breath. Patient says her shortness of breath has progressively gotten worse this past week. She developed upper respiratory symptoms with cough congestion and productive sputum earlier this week. She complains of some chills but no documented fever. She denies any worsening paroxysmal nocturnal dyspnea orthopnea or any worsening leg swelling. She only admits to about a 1 pound weight gain over the past week. This morning she was having significant shortness of breath and therefore came to the ER for further evaluation. Patient's chest x-ray findings assistive bilateral atelectasis but she was noted to be in acute respiratory failure with hypoxia and hospitalist services were contacted further treatment and evaluation.  PAST MEDICAL HISTORY:   Past Medical History:  Diagnosis Date  . Anxiety   . Arthritis    "hands" (11/10/2015)  . CAD s/p CABG x 1    a. 04/2015 LIMA to LAD  . Cancer of right breast ([place]) 08/09/2001  . Chronic diastolic CHF (congestive heart failure) ([place])   . COPD (chronic obstructive pulmonary disease) ([place])   . COPD (chronic obstructive pulmonary disease) ([place])   . GERD (gastroesophageal reflux disease)   . Hyperlipidemia   . Hypertension   . Hypothyroidism   . Maze operation for  AF w/ LAA clipping    a. 04/2015 Complete bilateral atrial lesion set using cryothermy and bipolar radiofrequency ablation with clipping of LA appendage  . Meniere disease   . On home oxygen therapy    "2L at night" (11/10/2015)  . PAF (paroxysmal atrial fibrillation) ([place])   . Pneumonia ~ 2010  . PONV (postoperative nausea and vomiting)    Pt felt like eardrums were gonna pop  . Post-surgical complete heart block, symptomatic    a. 04/2015 s/p MDT [id number] Advisa DR MRI DC PPM.  . Presence of permanent cardiac pacemaker   . Pulmonary hypertension   . Severe Mitral Regurgitation s/p MV Repair    a. 04/2015 s/p 27 mm [facility] Mitral bovine bioprosthetic tissue valve  . Surgical wound, non healing - chest wall 11/10/2015  . Tricuspid Regurgitation s/p Repair    a. 04/2015 s/p 26 mm Edwards mc3 ring annuloplasty  . Wound infection 10/13/2015   Superficial sternal wound infection    PAST SURGICAL HISTORY:   Past Surgical History:  Procedure Laterality Date  . APPLICATION OF A-CELL OF CHEST/ABDOMEN N/A 11/21/2015   Procedure: APPLICATION OF A-CELL OF CHEST/ABDOMEN;  Surgeon: Loel Lofty Dillingham, DO;  Location: [place];  Service: Plastics;  Laterality: N/A;  . APPLICATION OF A-CELL OF CHEST/ABDOMEN Right 12/21/2015   Procedure: APPLICATION OF A-CELL OF RIGHT CHEST;  Surgeon: Loel Lofty Dillingham, DO;  Location: [place];  Service: Plastics;  Laterality: Right;  . APPLICATION OF A-CELL OF CHEST/ABDOMEN Right 01/11/2016   Procedure: APPLICATION OF A-CELL OF RIGHT CHEST;  Surgeon: Lyndee Leo  S Dillingham, DO;  Location: [place];  Service: Plastics;  Laterality: Right;  . APPLICATION OF A-CELL OF CHEST/ABDOMEN Right 02/12/2016   Procedure: APPLICATION OF A-CELL TO RIGHT CHEST WOUND;  Surgeon: Loel Lofty Dillingham, DO;  Location: [place];  Service: Plastics;  Laterality: Right;  . APPLICATION OF WOUND VAC N/A 05/09/2015   Procedure: APPLICATION OF WOUND VAC;  Surgeon: Rexene Alberts, MD;  Location: [place];  Service:  Thoracic;  Laterality: N/A;  . APPLICATION OF WOUND VAC N/A 11/13/2015   Procedure: APPLICATION OF WOUND VAC;  Surgeon: Rexene Alberts, MD;  Location: [place];  Service: Thoracic;  Laterality: N/A;  . APPLICATION OF WOUND VAC N/A 11/21/2015   Procedure: APPLICATION OF WOUND VAC;  Surgeon: Loel Lofty Dillingham, DO;  Location: [place];  Service: Plastics;  Laterality: N/A;  . APPLICATION OF WOUND VAC Right 12/21/2015   Procedure: APPLICATION OF WOUND VAC to right chest wall ;  Surgeon: Loel Lofty Dillingham, DO;  Location: [place];  Service: Plastics;  Laterality: Right;  . APPLICATION OF WOUND VAC Right 01/11/2016   Procedure: APPLICATION OF WOUND VAC RIGHT CHEST ;  Surgeon: Loel Lofty Dillingham, DO;  Location: [place];  Service: Plastics;  Laterality: Right;  . APPLICATION OF WOUND VAC Right 01/24/2016   Procedure: APPLICATION OF WOUND VAC RIGHT CHEST WALL;  Surgeon: Loel Lofty Dillingham, DO;  Location: [place];  Service: Plastics;  Laterality: Right;  . APPLICATION OF WOUND VAC Right 02/12/2016   Procedure: RE-APPLICATION OF WOUND VAC TO RIGHT CHEST WOUND;  Surgeon: Loel Lofty Dillingham, DO;  Location: [place];  Service: Plastics;  Laterality: Right;  . BREAST BIOPSY Left 11/25/06   neg  . BREAST BIOPSY Left 01/20/12   /clip-neg  . CARDIAC CATHETERIZATION  11/2013   [facility]  . CARDIAC CATHETERIZATION  10/2014   [facility]  . CARDIAC VALVE REPLACEMENT    . CATARACT EXTRACTION W/ INTRAOCULAR LENS  IMPLANT, BILATERAL Bilateral 2014  . CLIPPING OF ATRIAL APPENDAGE N/A 04/25/2015   Procedure: CLIPPING OF ATRIAL APPENDAGE;  Surgeon: Rexene Alberts, MD;  Location: [place];  Service: Open Heart Surgery;  Laterality: N/A;  . COCHLEAR IMPLANT Left 2005?  . CORONARY ARTERY BYPASS GRAFT N/A 04/25/2015   Procedure: CORONARY ARTERY BYPASS GRAFTING (CABG) x ONE, using left internal mammary artery;  Surgeon: Rexene Alberts, MD;  Location: [place];  Service: Open Heart Surgery;  Laterality: N/A;  . DILATION AND CURETTAGE OF UTERUS  "several  before hysterectomy"  . EP IMPLANTABLE DEVICE N/A 05/02/2015   Procedure: Pacemaker Implant;  Surgeon: Thompson Grayer, MD;  Location: [place] [facility];  Service: Cardiovascular;  Laterality: N/A;  . I&D EXTREMITY Right 12/06/2015   Procedure: IRRIGATION AND DEBRIDEMENT RIGHT CHEST WALL WITH ACELL PLACEMENT AND VAC;  Surgeon: Loel Lofty Dillingham, DO;  Location: [place];  Service: Plastics;  Laterality: Right;  . INCISION AND DRAINAGE OF WOUND N/A 11/21/2015   Procedure: IRRIGATION AND DEBRIDEMENT WOUND;  Surgeon: Loel Lofty Dillingham, DO;  Location: [place];  Service: Plastics;  Laterality: N/A;  . INCISION AND DRAINAGE OF WOUND Right 12/21/2015   Procedure: IRRIGATION AND DEBRIDEMENT right chest wall WOUND;  Surgeon: Loel Lofty Dillingham, DO;  Location: [place];  Service: Plastics;  Laterality: Right;  right chest wall   . INCISION AND DRAINAGE OF WOUND Right 01/24/2016   Procedure: IRRIGATION AND DEBRIDEMENT RIGHT CHEST WALL WOUND;  Surgeon: Loel Lofty Dillingham, DO;  Location: [place];  Service: Plastics;  Laterality: Right;  .  INCISION AND DRAINAGE OF WOUND Right 03/28/2016   Procedure: IRRIGATION AND DEBRIDEMENT RIGHT CHEST WALL WOUND WITH A Cell Placement;  Surgeon: Loel Lofty Dillingham, DO;  Location: [place];  Service: Plastics;  Laterality: Right;  . INCISION AND DRAINAGE OF WOUND Right 05/17/2016   Procedure: IRRIGATION AND DEBRIDEMENT OF RIGHT CHEST WOUND WITH A CELL PLACEMENT;  Surgeon: Loel Lofty Dillingham, DO;  Location: [place];  Service: Plastics;  Laterality: Right;  . INSERT / REPLACE / REMOVE PACEMAKER    . IRRIGATION AND DEBRIDEMENT OF WOUND WITH SPLIT THICKNESS SKIN GRAFT Right 01/11/2016   Procedure: IRRIGATION AND DEBRIDEMENT OF RIGHT CHEST WOUND ;  Surgeon: Loel Lofty Dillingham, DO;  Location: [place];  Service: Plastics;  Laterality: Right;  . MASTECTOMY, PARTIAL Right 2002  . MAZE N/A 04/25/2015   Procedure: MAZE;  Surgeon: Rexene Alberts, MD;  Location: [place];  Service: Open Heart Surgery;   Laterality: N/A;  . MITRAL VALVE REPAIR N/A 04/25/2015   Procedure: MITRAL VALVE  REPLACEMENT using a 27 mm Edwards Perimount Magna Mitral Ease Valve;  Surgeon: Rexene Alberts, MD;  Location: [place];  Service: Open Heart Surgery;  Laterality: N/A;  . SKIN SPLIT GRAFT Right 02/12/2016   Procedure: IRRIGATION AND DEBRIDEMENT RIGHT CHEST WOUND;  Surgeon: Loel Lofty Dillingham, DO;  Location: [place];  Service: Plastics;  Laterality: Right;  . STERNAL WIRES REMOVAL N/A 06/05/2015   Procedure: STERNAL WIRES REMOVAL;  Surgeon: Rexene Alberts, MD;  Location: [place];  Service: Thoracic;  Laterality: N/A;  . STERNAL WOUND DEBRIDEMENT N/A 05/09/2015   Procedure: STERNAL WOUND DEBRIDEMENT;  Surgeon: Rexene Alberts, MD;  Location: [place];  Service: Thoracic;  Laterality: N/A;  . STERNAL WOUND DEBRIDEMENT N/A 11/13/2015   Procedure: Excisional drainage of RIGHT Chest wall mass and breast mass ;  Surgeon: Rexene Alberts, MD;  Location: [place];  Service: Thoracic;  Laterality: N/A;  . TEE WITHOUT CARDIOVERSION N/A 04/25/2015   Procedure: TRANSESOPHAGEAL ECHOCARDIOGRAM (TEE);  Surgeon: Rexene Alberts, MD;  Location: [place];  Service: Open Heart Surgery;  Laterality: N/A;  . TONSILLECTOMY    . TRAM Right 11/18/2015   Procedure: VRAM Vertical Rectus Abdominus Muscle Flap;  Surgeon: Loel Lofty Dillingham, DO;  Location: [place];  Service: Plastics;  Laterality: Right;  RIght Back  . TRICUSPID VALVE REPLACEMENT N/A 04/25/2015   Procedure: TRICUSPID VALVE REPAIR;  Surgeon: Rexene Alberts, MD;  Location: [place];  Service: Open Heart Surgery;  Laterality: N/A;  . VAGINAL HYSTERECTOMY      SOCIAL HISTORY:   Social History  Substance Use Topics  . Smoking status: Former Smoker    Packs/day: 1.50    Years: 40.00    Types: Cigarettes    Quit date: 04/20/1998  . Smokeless tobacco: Never Used  . Alcohol use Yes     Comment: 11/10/2015 "glass of wine a few times/year, if that"    FAMILY HISTORY:   Family History  Problem  Relation Age of Onset  . Heart disease Father   . Heart disease Brother   . Heart attack Paternal Uncle     DRUG ALLERGIES:   Allergies  Allergen Reactions  . Ace Inhibitors Cough  . Augmentin [Amoxicillin-Pot Clavulanate] Swelling    JOINTS, PAIN  . Penicillins Swelling    SWOLLEN JOINTS  Has patient had a PCN reaction causing immediate rash, facial/tongue/throat swelling, SOB or lightheadedness with hypotension: Yes Has patient had a PCN reaction causing severe rash involving  mucus membranes or skin necrosis: No Has patient had a PCN reaction that required hospitalization No Has patient had a PCN reaction occurring within the last 10 years: No If all of the above answers are "NO", then may proceed with Cephalosporin use.   . Nifedipine Other (See Comments)    UNSPECIFIED  . Propranolol Other (See Comments)    UNSPECIFIED  . Diazepam Other (See Comments)    Made her "hyper"  . Meperidine Other (See Comments)    Made her "hyper"  . Propoxyphene Other (See Comments)    Made her "hyper"    REVIEW OF SYSTEMS:   Review of Systems  Constitutional: Negative for fever and weight loss.  HENT: Negative for congestion, nosebleeds and tinnitus.   Eyes: Negative for blurred vision, double vision and redness.  Respiratory: Positive for cough, sputum production and shortness of breath. Negative for hemoptysis.   Cardiovascular: Negative for chest pain, orthopnea, leg swelling and PND.  Gastrointestinal: Negative for abdominal pain, diarrhea, melena, nausea and vomiting.  Genitourinary: Negative for dysuria, hematuria and urgency.  Musculoskeletal: Negative for falls and joint pain.  Neurological: Positive for weakness. Negative for dizziness, tingling, sensory change, focal weakness, seizures and headaches.  Endo/Heme/Allergies: Negative for polydipsia. Does not bruise/bleed easily.  Psychiatric/Behavioral: Negative for depression and memory loss. The patient is not nervous/anxious.      MEDICATIONS AT HOME:   Prior to Admission medications   Medication Sig Start Date End Date Taking? Authorizing Provider  acetaminophen (TYLENOL) 325 MG tablet Take 2 tablets (650 mg total) by mouth every 4 (four) hours as needed for mild pain (or Fever >/= 101). 05/11/15  Yes Erin R Barrett, PA-C  aspirin EC 81 MG EC tablet Take 1 tablet (81 mg total) by mouth daily. 05/11/15  Yes Erin R Barrett, PA-C  bisoprolol (ZEBETA) 10 MG tablet Take 1 tablet (10 mg total) by mouth daily. 06/01/15  Yes Wellington Hampshire, MD  ferrous sulfate 325 (65 FE) MG tablet Take 325 mg by mouth daily with breakfast.   Yes Historical Provider, MD  fluticasone (FLONASE) 50 MCG/ACT nasal spray Place 1 spray into both nostrils daily as needed for allergies.    Yes Historical Provider, MD  furosemide (LASIX) 20 MG tablet Take 20 mg by mouth 2 (two) times daily.    Yes Historical Provider, MD  gabapentin (NEURONTIN) 100 MG capsule Take 200 mg by mouth at bedtime.  09/29/15  Yes Historical Provider, MD  levothyroxine (SYNTHROID, LEVOTHROID) 88 MCG tablet Take 88 mcg by mouth daily before breakfast.   Yes Historical Provider, MD  losartan (COZAAR) 100 MG tablet Take 1 tablet (100 mg total) by mouth daily. 08/31/15  Yes Wellington Hampshire, MD  Multiple Vitamins-Minerals (PRESERVISION AREDS 2 PO) Take 2 tablets by mouth daily.   Yes Historical Provider, MD  pantoprazole (PROTONIX) 40 MG tablet Take 40 mg by mouth 2 (two) times daily.    Yes Historical Provider, MD  potassium chloride SA (K-DUR,KLOR-CON) 20 MEQ tablet Take 20 mEq by mouth daily. 09/27/15  Yes Historical Provider, MD  simvastatin (ZOCOR) 20 MG tablet Take 20 mg by mouth at bedtime.    Yes Historical Provider, MD  spironolactone (ALDACTONE) 25 MG tablet Take 1 tablet (25 mg total) by mouth daily. 12/12/14  Yes Wellington Hampshire, MD  traMADol (ULTRAM) 50 MG tablet Take 1 tablet (50 mg total) by mouth every 6 (six) hours as needed for moderate pain. 06/03/16  Yes Rexene Alberts, MD  warfarin (COUMADIN) 2.5 MG tablet Take 2.5-3.75 mg by mouth at bedtime. 3.75 mg at bedtime on Tuesday, Wednesday, Thursday, Friday, and Sunday 2.5 mg at bedtime on Monday and Saturday   Yes Historical Provider, MD      VITAL SIGNS:  Blood pressure 139/70, pulse 78, temperature 98.5 F (36.9 C), temperature source Oral, resp. rate (!) 26, height 5\' 2"  (1.575 m), weight 59 kg (130 lb), SpO2 98 %.  PHYSICAL EXAMINATION:  Physical Exam  GENERAL:  81 y.o.-year-old patient lying in the bed in mild resp. Distress.  EYES: Pupils equal, round, reactive to light and accommodation. No scleral icterus. Extraocular muscles intact.  HEENT: Head atraumatic, normocephalic. Oropharynx and nasopharynx clear. No oropharyngeal erythema, moist oral mucosa  NECK:  Supple, no jugular venous distention. No thyroid enlargement, no tenderness.  LUNGS: Normal breath sounds bilaterally, no wheezing, rhonchi, bibasilar rales. No use of accessory muscles of respiration.  CARDIOVASCULAR: S1, S2 RRR. No murmurs, rubs, gallops, clicks.  ABDOMEN: Soft, nontender, nondistended. Bowel sounds present. No organomegaly or mass.  EXTREMITIES: No pedal edema, cyanosis, or clubbing. + 2 pedal & radial pulses b/l.   NEUROLOGIC: Cranial nerves II through XII are intact. No focal Motor or sensory deficits appreciated b/l PSYCHIATRIC: The patient is alert and oriented x 3. Good affect.  SKIN: No obvious rash, lesion, or ulcer.   LABORATORY PANEL:   CBC  Recent Labs Lab 06/29/16 0841  WBC 10.9  HGB 10.4*  HCT 31.4*  PLT 193   ------------------------------------------------------------------------------------------------------------------  Chemistries   Recent Labs Lab 06/29/16 0841  NA 141  K 4.4  CL 106  CO2 26  GLUCOSE 152*  BUN 16  CREATININE 0.96  CALCIUM 9.1   ------------------------------------------------------------------------------------------------------------------  Cardiac  Enzymes  Recent Labs Lab 06/29/16 0841  TROPONINI 0.03*   ------------------------------------------------------------------------------------------------------------------  RADIOLOGY:  Dg Chest 2 View  Result Date: 06/29/2016 CLINICAL DATA:  Wheezing EXAM: CHEST  2 VIEW COMPARISON:  08/17/2015 FINDINGS: Cardiac shadow remains enlarged. Postsurgical changes are seen. Pacing device is again noted. Calcified granuloma is noted in the right middle lobe. Some minimal right upper lobe atelectasis is noted. Patchy changes are seen in the left lung base also consistent with atelectasis. No sizable effusion is noted. IMPRESSION: Patchy bilateral atelectasis. Electronically Signed   By: Inez Catalina M.D.   On: 06/29/2016 09:07     IMPRESSION AND PLAN:   81 year old female with past medical history of hypertension, hyperlipidemia, hypothyroidism, history of paroxysmal atrial fibrillation status post ablation and maze procedure, status post mitral valve repair, coronary disease status post bypass, history of breast cancer, chronic systolic CHF with presents to the hospital due to shortness of breath.  1. Acute respiratory failure with hypoxia-this is probably secondary to combination of mild COPD exacerbation along with some CHF. -I'll treat both conditions. Give IV diuresis with Lasix for CHF. -Place on prednisone taper and Z-Pak with some DuoNeb's and Pulmicort nebs for COPD. -Follow clinically. Patient is already on oxygen at bedtime.  2. CHF-acute on chronic systolic dysfunction. -Diurese the patient with IV Lasix, follow I's and O's and daily weights. -Continue Aldactone, bisoprolol.  3. COPD exacerbation-secondary to mild acute bronchitis. -Place on oral prednisone taper, DuoNeb's when necessary, Pulmicort nebs.  - follow clinically.   4. Hyperlipidemia-continue simvastatin.  5. Hypertension-continue losartan, bisoprolol.  6. Hypothyroidism-continue Synthroid.  7. History of  paroxysmal atrial fibrillation status post ablation and maze procedure-continue metoprolol for rate control. Patient is status post pacemaker. Continue Coumadin, INR therapeutic.  All the records are reviewed and case discussed with ED provider. Management plans discussed with the patient, family and they are in agreement.  CODE STATUS: Full  TOTAL TIME TAKING CARE OF THIS PATIENT: 50 minutes.    Henreitta Leber M.D on 06/29/2016 at 11:03 AM  Between 7am to 6pm - Pager - [phone]  After 6pm go to [email or web address] - password [email or web address] [facility]  Office  [phone]  CC: Primary care physician; Ezequiel Kayser, MD

## 2016-06-29 NOTE — ED Notes (Addendum)
Pt given glass of ice water per Dr. Mindi Slicker

## 2016-06-29 NOTE — ED Notes (Signed)
Attempted to call report. Room assignment is to be changed. Nurse will try to call back

## 2016-06-29 NOTE — ED Triage Notes (Signed)
C/O cough and cold symptoms for about one week.  Symptoms have progressively worsened over the past several days and last night patient was very SOB

## 2016-06-29 NOTE — ED Notes (Signed)
Critical lab reported to [place], MD

## 2016-06-29 NOTE — ED Notes (Signed)
Admitting physician at bedside along with husband.

## 2016-06-30 LAB — CBC
HCT: 32.8 % — ABNORMAL LOW (ref 35.0–47.0)
Hemoglobin: 11 g/dL — ABNORMAL LOW (ref 12.0–16.0)
MCH: 31 pg (ref 26.0–34.0)
MCHC: 33.5 g/dL (ref 32.0–36.0)
MCV: 92.4 fL (ref 80.0–100.0)
PLATELETS: 239 10*3/uL (ref 150–440)
RBC: 3.55 MIL/uL — AB (ref 3.80–5.20)
RDW: 20.9 % — AB (ref 11.5–14.5)
WBC: 8.3 10*3/uL (ref 3.6–11.0)

## 2016-06-30 LAB — BASIC METABOLIC PANEL
Anion gap: 8 (ref 5–15)
BUN: 22 mg/dL — AB (ref 6–20)
CO2: 32 mmol/L (ref 22–32)
CREATININE: 0.63 mg/dL (ref 0.44–1.00)
Calcium: 9.2 mg/dL (ref 8.9–10.3)
Chloride: 101 mmol/L (ref 101–111)
Glucose, Bld: 146 mg/dL — ABNORMAL HIGH (ref 65–99)
Potassium: 4.7 mmol/L (ref 3.5–5.1)
SODIUM: 141 mmol/L (ref 135–145)

## 2016-06-30 LAB — PROTIME-INR
INR: 2.99
PROTHROMBIN TIME: 31.7 s — AB (ref 11.4–15.2)

## 2016-06-30 NOTE — Care Management Important Message (Signed)
Important Message  Patient Details  Name: FAIGY HIBBEN MRN: [id number] Date of Birth: September 28, 1934   Medicare Important Message Given:  Yes    Carles Collet, RN 06/30/2016, 9:08 AM

## 2016-06-30 NOTE — Progress Notes (Signed)
[facility] [place] NAME: [name]    MR#:  [id number]  DATE OF BIRTH:  1935-02-15  SUBJECTIVE: Admitted for CHF and COPD exacerbation. She says she feels better, less short of breath. Able to get the sputum out.   CHIEF COMPLAINT:   Chief Complaint  Patient presents with  . Shortness of Breath    REVIEW OF SYSTEMS:   ROS CONSTITUTIONAL: No fever, fatigue or weakness.  EYES: No blurred or double vision.  EARS, NOSE, AND THROAT: No tinnitus or ear pain.  RESPIRATORY:  Cough, shortness of breath, wheezing. CARDIOVASCULAR: No chest pain, orthopnea, edema.  GASTROINTESTINAL: No nausea, vomiting, diarrhea or abdominal pain.  GENITOURINARY: No dysuria, hematuria.  ENDOCRINE: No polyuria, nocturia,  HEMATOLOGY: No anemia, easy bruising or bleeding SKIN: No rash or lesion. MUSCULOSKELETAL: No joint pain or arthritis.   NEUROLOGIC: No tingling, numbness, weakness.  PSYCHIATRY: No anxiety or depression.   DRUG ALLERGIES:   Allergies  Allergen Reactions  . Ace Inhibitors Cough  . Augmentin [Amoxicillin-Pot Clavulanate] Swelling    JOINTS, PAIN  . Penicillins Swelling    SWOLLEN JOINTS  Has patient had a PCN reaction causing immediate rash, facial/tongue/throat swelling, SOB or lightheadedness with hypotension: Yes Has patient had a PCN reaction causing severe rash involving mucus membranes or skin necrosis: No Has patient had a PCN reaction that required hospitalization No Has patient had a PCN reaction occurring within the last 10 years: No If all of the above answers are "NO", then may proceed with Cephalosporin use.   . Nifedipine Other (See Comments)    UNSPECIFIED  . Propranolol Other (See Comments)    UNSPECIFIED  . Diazepam Other (See Comments)    Made her "hyper"  . Meperidine Other (See Comments)    Made her "hyper"  . Propoxyphene Other (See Comments)    Made her "hyper"    VITALS:  Blood pressure 138/82,  pulse 75, temperature 97.5 F (36.4 C), temperature source Oral, resp. rate 18, height 5\' 1"  (1.549 m), weight 60 kg (132 lb 4.8 oz), SpO2 97 %.  PHYSICAL EXAMINATION:  GENERAL:  81 y.o.-year-old patient lying in the bed with no acute distress.  EYES: Pupils equal, round, reactive to light and accommodation. No scleral icterus. Extraocular muscles intact.  HEENT: Head atraumatic, normocephalic. Oropharynx and nasopharynx clear.  NECK:  Supple, no jugular venous distention. No thyroid enlargement, no tenderness.  LUNGS: Normal breath sounds bilaterally,faint  Wheeze in the left upper lobe. rales,rhonchi or crepitation. No use of accessory muscles of respiration.  CARDIOVASCULAR: S1, S2 normal. No murmurs, rubs, or gallops.  ABDOMEN: Soft, nontender, nondistended. Bowel sounds present. No organomegaly or mass.  EXTREMITIES: No pedal edema, cyanosis, or clubbing.  NEUROLOGIC: Cranial nerves II through XII are intact. Muscle strength 5/5 in all extremities. Sensation intact. Gait not checked.  PSYCHIATRIC: The patient is alert and oriented x 3.  SKIN: No obvious rash, lesion, or ulcer.    LABORATORY PANEL:   CBC  Recent Labs Lab 06/30/16 0630  WBC 8.3  HGB 11.0*  HCT 32.8*  PLT 239   ------------------------------------------------------------------------------------------------------------------  Chemistries   Recent Labs Lab 06/30/16 0630  NA 141  K 4.7  CL 101  CO2 32  GLUCOSE 146*  BUN 22*  CREATININE 0.63  CALCIUM 9.2   ------------------------------------------------------------------------------------------------------------------  Cardiac Enzymes  Recent Labs Lab 06/29/16 0841  TROPONINI 0.03*   ------------------------------------------------------------------------------------------------------------------  RADIOLOGY:  Dg Chest 2 View  Result Date:  06/29/2016 CLINICAL DATA:  Wheezing EXAM: CHEST  2 VIEW COMPARISON:  08/17/2015 FINDINGS: Cardiac shadow  remains enlarged. Postsurgical changes are seen. Pacing device is again noted. Calcified granuloma is noted in the right middle lobe. Some minimal right upper lobe atelectasis is noted. Patchy changes are seen in the left lung base also consistent with atelectasis. No sizable effusion is noted. IMPRESSION: Patchy bilateral atelectasis. Electronically Signed   By: Inez Catalina M.D.   On: 06/29/2016 09:07    EKG:   Orders placed or performed in visit on 04/05/16  . EKG 12-Lead    ASSESSMENT AND PLAN:  #1. Acute respiratory failure with hypoxia secondary to COPD exacerbation, mild CHF exacerbation: Patient says that she missed couple of doses of Lasix at home because of frequent urination. Right now she is on IV Lasix 20 mg every 12 hours, steroids, Zithromax, nebulizers. She says she feels better than yesterday. But not completely back to baseline. Continue IV diuretics today, continue Z-Pak, stable steroids, continue Pulmicort, DuoNeb's. Patient will need prescription for Pulmicort at discharge.   Likely Discharge tomorrow. Discussed with nurse. #2 hyperlipidemia continue statins #3 . hypertension: Controlled with losartan bisoprolol #4 history of recent mitral valve repair, CAD, CABG, chest wall wound: Patient had history of atrial fibrillation, ablation procedure. Continue metoprolol for rate control, continue Coumadin, history of pacemaker placement before.     All the records are reviewed and case discussed with Care Management/Social Workerr. Management plans discussed with the patient, family and they are in agreement.  CODE STATUS: full  TOTAL TIME TAKING CARE OF THIS PATIENT: 68minutes.   POSSIBLE D/C IN 1-2DAYS, DEPENDING ON CLINICAL CONDITION.   Epifanio Lesches M.D on 06/30/2016 at 10:18 AM  Between 7am to 6pm - Pager - [phone]  After 6pm go to [email or web address] - password [email or web address] [facility]  Office  [phone]  CC: Primary care physician;  Ezequiel Kayser, MD   Note: This dictation was prepared with Dragon dictation along with smaller phrase technology. Any transcriptional errors that result from this process are unintentional.

## 2016-07-01 LAB — PROTIME-INR
INR: 3.63
PROTHROMBIN TIME: 37 s — AB (ref 11.4–15.2)

## 2016-07-01 MED ORDER — BENZONATATE 100 MG PO CAPS: 100.0000 mg | ORAL_CAPSULE | Freq: Three times a day (TID) | ORAL | 0 refills | Status: DC | PRN

## 2016-07-01 MED ORDER — AZITHROMYCIN 250 MG PO TABS: 250.0000 mg | ORAL_TABLET | Freq: Every day | ORAL | 0 refills | Status: DC

## 2016-07-01 MED ORDER — HYDROCOD POLST-CPM POLST ER 10-8 MG/5ML PO SUER: 5.0000 mL | Freq: Two times a day (BID) | ORAL | Status: DC

## 2016-07-01 MED ORDER — HYDROCOD POLST-CPM POLST ER 10-8 MG/5ML PO SUER: 5.0000 mL | Freq: Two times a day (BID) | ORAL | 0 refills | Status: DC

## 2016-07-01 MED ORDER — PREDNISONE 10 MG (21) PO TBPK: 10.0000 mg | ORAL_TABLET | Freq: Every day | ORAL | 0 refills | Status: DC

## 2016-07-01 MED ORDER — IPRATROPIUM-ALBUTEROL 0.5-2.5 (3) MG/3ML IN SOLN: 3.0000 mL | Freq: Four times a day (QID) | RESPIRATORY_TRACT | 1 refills | Status: DC | PRN

## 2016-07-01 NOTE — Progress Notes (Signed)
Discharged to home with her husband.  Explained new prescriptions.  Most of these drugs she's taken before.  The cough medications are new to her.  IV DC'd.  Telemetry stopped

## 2016-07-01 NOTE — Discharge Summary (Signed)
[facility] at [place] NAME: [name]    MR#:  [id number]  DATE OF BIRTH:  29-Sep-1934  DATE OF ADMISSION:  06/29/2016   ADMITTING PHYSICIAN: Henreitta Leber, MD  DATE OF DISCHARGE: 07/01/2016  2:02 PM  PRIMARY CARE PHYSICIAN: THIES, DAVID, MD   ADMISSION DIAGNOSIS:   Hypoxia [R09.02] Congestive heart failure, unspecified congestive heart failure chronicity, unspecified congestive heart failure type ([place]) [I50.9] Dyspnea, unspecified type [R06.00]  DISCHARGE DIAGNOSIS:   Active Problems:   Acute respiratory failure with hypoxia ([place])   SECONDARY DIAGNOSIS:   Past Medical History:  Diagnosis Date  . Anxiety   . Arthritis    "hands" (11/10/2015)  . CAD s/p CABG x 1    a. 04/2015 LIMA to LAD  . Cancer of right breast ([facility]) 08/09/2001  . Chronic diastolic CHF (congestive heart failure) ([place])   . COPD (chronic obstructive pulmonary disease) ([place])   . COPD (chronic obstructive pulmonary disease) ([place])   . GERD (gastroesophageal reflux disease)   . Hyperlipidemia   . Hypertension   . Hypothyroidism   . 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 04/2015 Complete bilateral atrial lesion set using cryothermy and bipolar radiofrequency ablation with clipping of LA appendage  . Meniere disease   . On home oxygen therapy    "2L at night" (11/10/2015)  . PAF (paroxysmal atrial fibrillation) ([place])   . Pneumonia ~ 2010  . PONV (postoperative nausea and vomiting)    Pt felt like eardrums were gonna pop  . Post-surgical complete heart block, symptomatic    a. 04/2015 s/p MDT [id number] Advisa DR MRI DC PPM.  . Presence of permanent cardiac pacemaker   . Pulmonary hypertension   . Severe Mitral Regurgitation s/p MV Repair    a. 04/2015 s/p 27 mm [facility] Mitral bovine bioprosthetic tissue valve  . Surgical wound, non healing - chest wall 11/10/2015  . Tricuspid Regurgitation s/p Repair    a. 04/2015 s/p 26 mm Edwards mc3 ring annuloplasty    . Wound infection 10/13/2015   Superficial sternal wound infection    HOSPITAL COURSE:   81 year old female with past medical history of hypertension, hyperlipidemia, hypothyroidism, history of paroxysmal atrial fibrillation status post ablation and maze procedure, status post mitral valve repair, coronary disease status post bypass, history of breast cancer, chronic systolic CHF with presents to the hospital due to shortness of breath.  1. Acute respiratory failure with hypoxia- secondary to combination of  COPD exacerbation along with some CHF. - triggered by common cold, patient then stopped using her lasix for a few days -Diuresed with IV lasix here and changed to oral lasix at discharge - discussed low sodium diet, fluid restriction, CHF clinic follow up -uses nocturnal o2 at baseline. - continue aldactone and bisoprolol - follows with Dr. Caryl Comes and also Dr. Fletcher Anon as outpatient - ECHO to be done as outpatient  2. COPD exacerbation-secondary to mild acute bronchitis. on oral prednisone taper, DuoNeb's when necessary - follows with Dr. Alva Garnet. allergic to multiple inhalers - on Z pack - cough meds added  3. Hyperlipidemia-continue simvastatin.  4. Hypertension-continue losartan, bisoprolol.  5. Hypothyroidism-continue Synthroid.  6. History of paroxysmal atrial fibrillation status post ablation and maze procedure-continue bisoprolol, for rate control. Patient is status post pacemaker. Continue Coumadin for anti coagulation.  Plan to discharge home today and outpatient follow up.   DISCHARGE CONDITIONS:   Guarded CONSULTS OBTAINED:   Treatment  Team:  Gladstone Lighter, MD  DRUG ALLERGIES:   Allergies  Allergen Reactions  . Ace Inhibitors Cough  . Augmentin [Amoxicillin-Pot Clavulanate] Swelling    JOINTS, PAIN  . Penicillins Swelling    SWOLLEN JOINTS  Has patient had a PCN reaction causing immediate rash, facial/tongue/throat swelling, SOB or  lightheadedness with hypotension: Yes Has patient had a PCN reaction causing severe rash involving mucus membranes or skin necrosis: No Has patient had a PCN reaction that required hospitalization No Has patient had a PCN reaction occurring within the last 10 years: No If all of the above answers are "NO", then may proceed with Cephalosporin use.   . Nifedipine Other (See Comments)    UNSPECIFIED  . Propranolol Other (See Comments)    UNSPECIFIED  . Diazepam Other (See Comments)    Made her "hyper"  . Meperidine Other (See Comments)    Made her "hyper"  . Propoxyphene Other (See Comments)    Made her "hyper"   DISCHARGE MEDICATIONS:     Medication List    TAKE these medications   acetaminophen 325 MG tablet Commonly known as:  TYLENOL Take 2 tablets (650 mg total) by mouth every 4 (four) hours as needed for mild pain (or Fever >/= 101).   aspirin 81 MG EC tablet Take 1 tablet (81 mg total) by mouth daily.   azithromycin 250 MG tablet Commonly known as:  ZITHROMAX Take 1 tablet (250 mg total) by mouth daily. X 3 more days   benzonatate 100 MG capsule Commonly known as:  TESSALON Take 1 capsule (100 mg total) by mouth 3 (three) times daily as needed for cough.   bisoprolol 10 MG tablet Commonly known as:  ZEBETA Take 1 tablet (10 mg total) by mouth daily.   chlorpheniramine-HYDROcodone 10-8 MG/5ML Suer Commonly known as:  TUSSIONEX Take 5 mLs by mouth every 12 (twelve) hours. Notes to patient:  For cough   ferrous sulfate 325 (65 FE) MG tablet Take 325 mg by mouth daily with breakfast.   fluticasone 50 MCG/ACT nasal spray Commonly known as:  FLONASE Place 1 spray into both nostrils daily as needed for allergies.   furosemide 20 MG tablet Commonly known as:  LASIX Take 20 mg by mouth 2 (two) times daily. Notes to patient:  Don't take it in the evening, you will be up during the night to urinate.  Take it around 4:00 pm   gabapentin 100 MG capsule Commonly  known as:  NEURONTIN Take 200 mg by mouth at bedtime.   ipratropium-albuterol 0.5-2.5 (3) MG/3ML Soln Commonly known as:  DUONEB Take 3 mLs by nebulization every 6 (six) hours as needed.   levothyroxine 88 MCG tablet Commonly known as:  SYNTHROID, LEVOTHROID Take 88 mcg by mouth daily before breakfast.   losartan 100 MG tablet Commonly known as:  COZAAR Take 1 tablet (100 mg total) by mouth daily.   pantoprazole 40 MG tablet Commonly known as:  PROTONIX Take 40 mg by mouth 2 (two) times daily.   potassium chloride SA 20 MEQ tablet Commonly known as:  K-DUR,KLOR-CON Take 20 mEq by mouth daily.   predniSONE 10 MG (21) Tbpk tablet Commonly known as:  STERAPRED UNI-PAK 21 TAB Take 1 tablet (10 mg total) by mouth daily. 5 tabs PO x 1 day 4 tabs PO x 1 day 3 tabs PO x 1 day 2 tabs PO x 1 day 1 tab PO x 1 day and stop Notes to patient:  Follow the instructions carefully.  Take with food   PRESERVISION AREDS 2 PO Take 2 tablets by mouth daily.   simvastatin 20 MG tablet Commonly known as:  ZOCOR Take 20 mg by mouth at bedtime.   spironolactone 25 MG tablet Commonly known as:  ALDACTONE Take 1 tablet (25 mg total) by mouth daily.   traMADol 50 MG tablet Commonly known as:  ULTRAM Take 1 tablet (50 mg total) by mouth every 6 (six) hours as needed for moderate pain.   warfarin 2.5 MG tablet Commonly known as:  COUMADIN Take 2.5-3.75 mg by mouth at bedtime. 3.75 mg at bedtime on Tuesday, Wednesday, Thursday, Friday, and Sunday 2.5 mg at bedtime on Monday and Saturday        DISCHARGE INSTRUCTIONS:   1. PCP follow-up in 2 weeks 2. Pulmonary follow-up in 1-2 weeks 3. Cardiology follow up in 1 week 4. CHF follow-up is recommended  DIET:   Cardiac diet  ACTIVITY:   Activity as tolerated  OXYGEN:   Home Oxygen: No.  Oxygen Delivery: room air  DISCHARGE LOCATION:   home   If you experience worsening of your admission symptoms, develop shortness of breath, life  threatening emergency, suicidal or homicidal thoughts you must seek medical attention immediately by calling 911 or calling your MD immediately  if symptoms less severe.  You Must read complete instructions/literature along with all the possible adverse reactions/side effects for all the Medicines you take and that have been prescribed to you. Take any new Medicines after you have completely understood and accpet all the possible adverse reactions/side effects.   Please note  You were cared for by a hospitalist during your hospital stay. If you have any questions about your discharge medications or the care you received while you were in the hospital after you are discharged, you can call the unit and asked to speak with the hospitalist on call if the hospitalist that took care of you is not available. Once you are discharged, your primary care physician will handle any further medical issues. Please note that NO REFILLS for any discharge medications will be authorized once you are discharged, as it is imperative that you return to your primary care physician (or establish a relationship with a primary care physician if you do not have one) for your aftercare needs so that they can reassess your need for medications and monitor your lab values.    On the day of Discharge:  VITAL SIGNS:   Blood pressure (!) 133/40, pulse 65, temperature 98.1 F (36.7 C), temperature source Oral, resp. rate 20, height 5\' 1"  (1.549 m), weight 60 kg (132 lb 4.8 oz), SpO2 91 %.  PHYSICAL EXAMINATION:    GENERAL:  81 y.o.-year-old patient lying in the bed with no acute distress.  EYES: Pupils equal, round, reactive to light and accommodation. No scleral icterus. Extraocular muscles intact.  HEENT: Head atraumatic, normocephalic. Oropharynx and nasopharynx clear.  NECK:  Supple, no jugular venous distention. No thyroid enlargement, no tenderness.  LUNGS: Scattered wheezing posteriorly with scant breath sounds. No  rales,rhonchi or crepitation. No use of accessory muscles of respiration.  CARDIOVASCULAR: S1, S2 normal. No rubs, or gallops. 3/6 systolic murmur is present ABDOMEN: Soft, non-tender, non-distended. Bowel sounds present. No organomegaly or mass.  EXTREMITIES: No pedal edema, cyanosis, or clubbing.  NEUROLOGIC: Cranial nerves II through XII are intact. Muscle strength 5/5 in all extremities. Sensation intact. Gait not checked.  PSYCHIATRIC: The patient is alert and oriented x 3.  SKIN: No obvious rash,  lesion, or ulcer.   DATA REVIEW:   CBC  Recent Labs Lab 06/30/16 0630  WBC 8.3  HGB 11.0*  HCT 32.8*  PLT 239    Chemistries   Recent Labs Lab 06/30/16 0630  NA 141  K 4.7  CL 101  CO2 32  GLUCOSE 146*  BUN 22*  CREATININE 0.63  CALCIUM 9.2     Microbiology Results  Results for orders placed or performed during the hospital encounter of 03/28/16  Aerobic/Anaerobic Culture (surgical/deep wound)     Status: None   Collection Time: 03/28/16  9:05 AM  Result Value Ref Range Status   Specimen Description WOUND RIGHT CHEST  Final   Special Requests CHEST WALL WOUND  Final   Gram Stain   Final    RARE WBC PRESENT, PREDOMINANTLY PMN NO ORGANISMS SEEN    Culture No growth aerobically or anaerobically.  Final   Report Status 04/02/2016 FINAL  Final  Acid Fast Smear (AFB)     Status: None   Collection Time: 03/28/16  9:05 AM  Result Value Ref Range Status   AFB Specimen Processing Concentration  Final   Acid Fast Smear Negative  Final    Comment: (NOTE) Performed At: [facility] [place], [place] [id number] Lindon Romp MD [id number]    Source (AFB) CHEST WALL  Final  Acid Fast Culture with reflexed sensitivities     Status: None   Collection Time: 03/28/16  9:05 AM  Result Value Ref Range Status   Acid Fast Culture Negative  Final    Comment: (NOTE) No acid fast bacilli isolated after 6 weeks. Performed At: [facility] [place], [place] [id number] Lindon Romp MD [id number]    Source of Sample WOUND  Final    Comment: RIGHT CHEST     RADIOLOGY:  No results found.   Management plans discussed with the patient, family and they are in agreement.  CODE STATUS:     Code Status Orders        Start     Ordered   06/29/16 1241  Full code  Continuous     06/29/16 1240    Code Status History    Date Active Date Inactive Code Status Order ID Comments User Context   11/10/2015 12:42 PM 11/27/2015  3:52 PM Full Code [id number]  John Giovanni, PA-C ED   05/02/2015 10:02 PM 05/11/2015  7:07 PM Full Code [id number]  Thompson Grayer, MD Inpatient   04/25/2015  4:29 PM 05/02/2015 10:02 PM Full Code [id number]  Rexene Alberts, MD Inpatient      TOTAL TIME TAKING CARE OF THIS PATIENT: 37 minutes.    Gladstone Lighter M.D on 07/01/2016 at 3:18 PM  Between 7am to 6pm - Pager - [phone]  After 6pm go to [email or web address] - Proofreader  [facility]  Office  [phone]  CC: Primary care physician; Ezequiel Kayser, MD   Note: This dictation was prepared with Dragon dictation along with smaller phrase technology. Any transcriptional errors that result from this process are unintentional.

## 2016-07-01 NOTE — Discharge Instructions (Signed)
Heart Failure Clinic appointment on July 12, 2016 at 11:30am with Darylene Price, [place]. Please call [phone] to reschedule.

## 2016-07-01 NOTE — Progress Notes (Signed)
Heart Failure Clinic appointment on July 12, 2016 at 11:30am with Darylene Price, [place].

## 2016-07-02 ENCOUNTER — Other Ambulatory Visit: Payer: Self-pay | Admitting: Cardiovascular Disease

## 2016-07-02 NOTE — Telephone Encounter (Signed)
Review for refill. 

## 2016-07-05 ENCOUNTER — Ambulatory Visit (INDEPENDENT_AMBULATORY_CARE_PROVIDER_SITE_OTHER): Payer: Medicare Other

## 2016-07-05 DIAGNOSIS — I4891 Unspecified atrial fibrillation: Secondary | ICD-10-CM

## 2016-07-05 DIAGNOSIS — Z953 Presence of xenogenic heart valve: Secondary | ICD-10-CM | POA: Diagnosis not present

## 2016-07-05 DIAGNOSIS — I340 Nonrheumatic mitral (valve) insufficiency: Secondary | ICD-10-CM

## 2016-07-05 LAB — POCT INR: INR: 3

## 2016-07-12 ENCOUNTER — Ambulatory Visit: Payer: Medicare Other | Admitting: Family

## 2016-07-24 ENCOUNTER — Ambulatory Visit (INDEPENDENT_AMBULATORY_CARE_PROVIDER_SITE_OTHER): Payer: Medicare Other

## 2016-07-24 DIAGNOSIS — Z953 Presence of xenogenic heart valve: Secondary | ICD-10-CM

## 2016-07-24 DIAGNOSIS — I2510 Atherosclerotic heart disease of native coronary artery without angina pectoris: Secondary | ICD-10-CM

## 2016-07-24 DIAGNOSIS — Z5181 Encounter for therapeutic drug level monitoring: Secondary | ICD-10-CM

## 2016-07-24 LAB — POCT INR: INR: 2.6

## 2016-07-30 ENCOUNTER — Ambulatory Visit (INDEPENDENT_AMBULATORY_CARE_PROVIDER_SITE_OTHER): Payer: Medicare Other | Admitting: *Deleted

## 2016-07-30 DIAGNOSIS — I442 Atrioventricular block, complete: Secondary | ICD-10-CM

## 2016-07-30 NOTE — Progress Notes (Signed)
Remote pacemaker transmission.   

## 2016-08-08 ENCOUNTER — Encounter: Payer: Self-pay | Admitting: Cardiology

## 2016-08-14 LAB — CUP PACEART REMOTE DEVICE CHECK
Brady Statistic AP VP Percent: 0.07 %
Brady Statistic AS VP Percent: 0.02 %
Brady Statistic RA Percent Paced: 77.04 %
Brady Statistic RV Percent Paced: 0.09 %
Implantable Lead Implant Date: 20160823
Implantable Lead Location: 753860
Implantable Lead Model: 5076
Implantable Lead Model: 5076
Implantable Pulse Generator Implant Date: 20160823
Lead Channel Impedance Value: 361 Ohm
Lead Channel Impedance Value: 418 Ohm
Lead Channel Impedance Value: 475 Ohm
Lead Channel Pacing Threshold Pulse Width: 0.4 ms
Lead Channel Pacing Threshold Pulse Width: 0.4 ms
Lead Channel Sensing Intrinsic Amplitude: 10 mV
Lead Channel Setting Pacing Amplitude: 2 V
Lead Channel Setting Pacing Pulse Width: 0.4 ms
Lead Channel Setting Sensing Sensitivity: 2.8 mV
MDC IDC LEAD IMPLANT DT: 20160823
MDC IDC LEAD LOCATION: 753859
MDC IDC MSMT BATTERY REMAINING LONGEVITY: 102 mo
MDC IDC MSMT BATTERY VOLTAGE: 3.02 V
MDC IDC MSMT LEADCHNL RA IMPEDANCE VALUE: 437 Ohm
MDC IDC MSMT LEADCHNL RA PACING THRESHOLD AMPLITUDE: 0.5 V
MDC IDC MSMT LEADCHNL RA SENSING INTR AMPL: 2.125 mV
MDC IDC MSMT LEADCHNL RA SENSING INTR AMPL: 2.125 mV
MDC IDC MSMT LEADCHNL RV PACING THRESHOLD AMPLITUDE: 0.5 V
MDC IDC MSMT LEADCHNL RV SENSING INTR AMPL: 10 mV
MDC IDC SESS DTM: 20171121153112
MDC IDC SET LEADCHNL RV PACING AMPLITUDE: 2.5 V
MDC IDC STAT BRADY AP VS PERCENT: 76.98 %
MDC IDC STAT BRADY AS VS PERCENT: 22.94 %

## 2016-08-21 ENCOUNTER — Ambulatory Visit (INDEPENDENT_AMBULATORY_CARE_PROVIDER_SITE_OTHER): Payer: Medicare Other

## 2016-08-21 DIAGNOSIS — Z953 Presence of xenogenic heart valve: Secondary | ICD-10-CM | POA: Diagnosis not present

## 2016-08-21 DIAGNOSIS — Z5181 Encounter for therapeutic drug level monitoring: Secondary | ICD-10-CM | POA: Diagnosis not present

## 2016-08-21 DIAGNOSIS — I2510 Atherosclerotic heart disease of native coronary artery without angina pectoris: Secondary | ICD-10-CM

## 2016-08-21 LAB — POCT INR: INR: 1.7

## 2016-09-11 ENCOUNTER — Telehealth: Payer: Self-pay | Admitting: Cardiovascular Disease

## 2016-09-11 ENCOUNTER — Ambulatory Visit (INDEPENDENT_AMBULATORY_CARE_PROVIDER_SITE_OTHER): Payer: Medicare Other | Admitting: *Deleted

## 2016-09-11 DIAGNOSIS — Z953 Presence of xenogenic heart valve: Secondary | ICD-10-CM | POA: Diagnosis not present

## 2016-09-11 DIAGNOSIS — Z5181 Encounter for therapeutic drug level monitoring: Secondary | ICD-10-CM | POA: Diagnosis not present

## 2016-09-11 LAB — POCT INR: INR: 2.2

## 2016-09-11 NOTE — Telephone Encounter (Signed)
Patient in office today for coumadin check.  She says she has been cleared by plastic surgeon and will soon be cleared by Dr. Roxy Manns from a wound.  Patient wants to know if she can do the echo that Dr. Fletcher Anon wanted her to do.

## 2016-09-11 NOTE — Telephone Encounter (Signed)
Pt in office today for coumadin check and inquires as to when she can have a repeat echo.  I called patient and s/w spouse who indicates wound is completely healed except for one small spot at the tip of breastbone. She has been released from Plastic and Reconstructive Surgery. Has 1/5 appt w/Dr. Roxy Manns.   08/20/16 [facility] Plastic and Reconstructive Surgery,Claire Dillingham OV notes:  " She has been putting antifungal cream on the area. The area looks the best I have ever seen it and they both agree. There is a 1 - 2 mm area that is superficially open. There is no pain or hardness. There is no drainage and has not had any for several weeks. There is no sign of infection. Overall she is doing great."   Per Dr. Tyrell Antonio 7/17 OV notes: "Once her sternal wound heals completely, I plan on repeating her echocardiogram." Pt instructed to f/u in 3 months. She is past due for OV.  Forward to MD to review and advise if pt needs f/u visit and if echo can be scheduled with 1-57mm area superficially open.

## 2016-09-13 ENCOUNTER — Encounter: Payer: Self-pay | Admitting: Thoracic Surgery (Cardiothoracic Vascular Surgery)

## 2016-09-13 ENCOUNTER — Ambulatory Visit (INDEPENDENT_AMBULATORY_CARE_PROVIDER_SITE_OTHER): Payer: Medicare Other | Admitting: Thoracic Surgery (Cardiothoracic Vascular Surgery)

## 2016-09-13 VITALS — BP 113/71 | HR 81 | Resp 16 | Ht 61.0 in | Wt 128.0 lb

## 2016-09-13 DIAGNOSIS — I5042 Chronic combined systolic (congestive) and diastolic (congestive) heart failure: Secondary | ICD-10-CM | POA: Diagnosis not present

## 2016-09-13 DIAGNOSIS — Z951 Presence of aortocoronary bypass graft: Secondary | ICD-10-CM | POA: Diagnosis not present

## 2016-09-13 DIAGNOSIS — Z953 Presence of xenogenic heart valve: Secondary | ICD-10-CM

## 2016-09-13 DIAGNOSIS — Z9889 Other specified postprocedural states: Secondary | ICD-10-CM

## 2016-09-13 DIAGNOSIS — Z8679 Personal history of other diseases of the circulatory system: Secondary | ICD-10-CM

## 2016-09-13 NOTE — Progress Notes (Signed)
[name]       [name],[name]             [name]     CARDIOTHORACIC SURGERY OFFICE NOTE  Referring Provider is Wellington Hampshire, MD PCP is Ezequiel Kayser, MD   HPI:  Patient is an 82 year old female with multiple medical problems including chronic combined systolic and diastolic congestive heart failure, coronary artery disease, hypertension, atrial fibrillation on long-term warfarin anticoagulation, complete heart block status post permanent pacemaker placement, COPD, hyperlipidemia, and Mnire's disease who returns to the office today nearly 1-1/2 years status post mitral valve replacement using a bioprosthetic tissue valve, tricuspid valve repair, coronary artery bypass grafting, and Maze procedure on 04/25/2015 for rheumatic and degenerative mitral valve disease with severe symptomatic primary mitral regurgitation, tricuspid regurgitation, coronary artery disease, and recurrent persistent atrial fibrillation.  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 This required latissimus dorsi flap coverage and took many months to gradually heal. Eventually her wounds healed completely. She was seen recently in follow-up by Dr. Marla Roe who has released her from follow-up. She returns to our office today and reports that she is now doing better than she has done for a long time. She is exercising regularly and her exercise tolerance continues to gradually improve. She still gets short of breath with activity but overall she feels much better than she has felt in a long time.  She was hospitalized for several days at [facility] last October following an upper respiratory tract infection associated with increased shortness breath that was attributed to COPD flare and congestive heart failure.  At present she  only gets short of breath with more strenuous exertion. She denies any palpitations. She denies any dizzy spells or near syncopal events. She denies any symptoms of exertional chest pain or chest tightness.  Over the past 2 months she has done very well. She has not had any lower extremity edema. She remains chronically anticoagulated using warfarin.  She states that she has had her pacemaker interrogated remotely and she is scheduled for follow-up appointments with Dr. Caryl Comes and Dr. Fletcher Anon in the near future. She hopes to discuss whether or not she will need to remain on warfarin.  She has not had a follow-up echocardiogram performed since December 2016. At that time her valves were functioning normally but her ejection fraction was reportedly only 30-35%.   Current Outpatient Prescriptions  Medication Sig Dispense Refill  . acetaminophen (TYLENOL) 325 MG tablet Take 2 tablets (650 mg total) by mouth every 4 (four) hours as needed for mild pain (or Fever >/= 101).    Marland Kitchen aspirin EC 81 MG EC tablet Take 1 tablet (81 mg total) by mouth daily.    . benzonatate (TESSALON) 100 MG capsule Take 1 capsule (100 mg total) by mouth 3 (three) times daily as needed for cough. 20 capsule 0  . bisoprolol (ZEBETA) 10 MG tablet Take 1 tablet (10 mg total) by mouth daily. 30 tablet 5  . ferrous sulfate 325 (65 FE) MG tablet Take 325 mg by mouth daily with breakfast.    . fluticasone (FLONASE) 50 MCG/ACT nasal spray Place 1 spray into both nostrils daily as needed for allergies.     . furosemide (LASIX) 20 MG tablet Take 20 mg by mouth 2 (two) times daily.     Marland Kitchen  gabapentin (NEURONTIN) 100 MG capsule Take 200 mg by mouth at bedtime.   1  . ipratropium-albuterol (DUONEB) 0.5-2.5 (3) MG/3ML SOLN Take 3 mLs by nebulization every 6 (six) hours as needed. 360 mL 1  . levothyroxine (SYNTHROID, LEVOTHROID) 88 MCG tablet Take 88 mcg by mouth daily before breakfast.    . losartan (COZAAR) 100 MG tablet Take 1 tablet (100 mg total)  by mouth daily. 30 tablet 3  . Multiple Vitamins-Minerals (PRESERVISION AREDS 2 PO) Take 2 tablets by mouth daily.    . pantoprazole (PROTONIX) 40 MG tablet Take 40 mg by mouth 2 (two) times daily.     . potassium chloride SA (K-DUR,KLOR-CON) 20 MEQ tablet Take 20 mEq by mouth daily.  2  . simvastatin (ZOCOR) 20 MG tablet Take 20 mg by mouth at bedtime.     Marland Kitchen spironolactone (ALDACTONE) 25 MG tablet Take 1 tablet (25 mg total) by mouth daily. 30 tablet 6  . warfarin (COUMADIN) 2.5 MG tablet TAKE 1 TABLET BY MOUTH AS DIRECTED 45 tablet 2   No current facility-administered medications for this visit.       Physical Exam:   BP 113/71 (BP Location: Right Arm, Patient Position: Sitting, Cuff Size: Normal)   Pulse 81   Resp 16   Ht 5\' 1"  (1.549 m)   Wt 128 lb (58.1 kg)   SpO2 93% Comment: ON RA  BMI 24.19 kg/m   General:  Elderly but well-appearing  Chest:   Clear to auscultation  CV:   Regular rate and rhythm without murmur  Incisions:  All of the patient's surgical incisions have healed completely  Abdomen:  Soft and nontender  Extremities:  Warm and well-perfused, no edema  Diagnostic Tests:  2 channel telemetry rhythm strip demonstrates what appears to be atrial paced rhythm   Impression:  Patient appears to be clinically doing well nearly 1-1/2 years since she underwent mitral valve replacement using a bioprosthetic tissue valve, tricuspid valve repair, coronary artery bypass grafting, and Maze procedure. All of her surgical wounds have finally healed completely. She describes stable symptoms of exertional shortness of breath consistent with chronic combined systolic and diastolic congestive heart failure, New York Heart Association functional class II. She is not having palpitations or other symptoms to suggest a recurrence of atrial fibrillation. Her pacemaker has apparently been interrogated recently, it is unclear whether not she has any underlying burden of atrial fibrillation.  She remains chronically anticoagulated using warfarin.     Plan:  We have not recommended any changes to the patient's current medications. With respect to long-term anticoagulation, it would be reasonable to consider stopping warfarin as long as the patient's pacemaker reveals no evidence to suggest the presence of intermittent atrial fibrillation. At the time of her surgery the patient's left atrial appendage was obliterated. Her mitral valve was replaced using a bioprosthetic tissue valve.  However, given the patient's advanced age and numerous risk factors it might be reasonable to continue anticoagulation using warfarin or a NOAC indefinitely as long as she continues to tolerate anticoagulation therapy.  Her CHADS-VASC score is probably at least 5.   The patient has been reminded regarding the importance of dental hygiene and the lifelong need for antibiotic prophylaxis for all dental cleanings and other related invasive procedures.  The patient will return to our office next August for routine follow-up and rhythm check. At some point she needs to have a follow-up echocardiogram performed to reassess left ventricular function.  I spent in  excess of 15 minutes during the conduct of this office consultation and >50% of this time involved direct face-to-face encounter with the patient for counseling and/or coordination of their care.    Valentina Gu. Roxy Manns, MD 09/13/2016 10:48 AM

## 2016-09-13 NOTE — Telephone Encounter (Signed)
Schedule echo and follow up with me as soon as available.

## 2016-09-13 NOTE — Patient Instructions (Signed)

## 2016-09-16 ENCOUNTER — Ambulatory Visit: Payer: Medicare Other | Admitting: Thoracic Surgery (Cardiothoracic Vascular Surgery)

## 2016-09-16 ENCOUNTER — Other Ambulatory Visit: Payer: Self-pay

## 2016-09-16 DIAGNOSIS — I5042 Chronic combined systolic (congestive) and diastolic (congestive) heart failure: Secondary | ICD-10-CM

## 2016-09-16 NOTE — Telephone Encounter (Signed)
Reviewed recommendations w/pt spouse who is agreeable w/plan. Echo Jan 9; f/u Jan 18

## 2016-09-17 ENCOUNTER — Ambulatory Visit (INDEPENDENT_AMBULATORY_CARE_PROVIDER_SITE_OTHER): Payer: Medicare Other

## 2016-09-17 ENCOUNTER — Other Ambulatory Visit: Payer: Self-pay

## 2016-09-17 DIAGNOSIS — I5042 Chronic combined systolic (congestive) and diastolic (congestive) heart failure: Secondary | ICD-10-CM | POA: Diagnosis not present

## 2016-09-26 ENCOUNTER — Ambulatory Visit: Payer: Self-pay | Admitting: Cardiovascular Disease

## 2016-10-09 ENCOUNTER — Ambulatory Visit (INDEPENDENT_AMBULATORY_CARE_PROVIDER_SITE_OTHER): Payer: Medicare Other

## 2016-10-09 DIAGNOSIS — Z5181 Encounter for therapeutic drug level monitoring: Secondary | ICD-10-CM | POA: Diagnosis not present

## 2016-10-09 DIAGNOSIS — Z953 Presence of xenogenic heart valve: Secondary | ICD-10-CM | POA: Diagnosis not present

## 2016-10-09 LAB — POCT INR: INR: 1.7

## 2016-10-09 MED ORDER — WARFARIN SODIUM 2.5 MG PO TABS: ORAL_TABLET | ORAL | 3 refills | Status: DC

## 2016-10-15 ENCOUNTER — Encounter: Payer: Self-pay | Admitting: Cardiovascular Disease

## 2016-10-15 ENCOUNTER — Ambulatory Visit (INDEPENDENT_AMBULATORY_CARE_PROVIDER_SITE_OTHER): Payer: Medicare Other | Admitting: Cardiovascular Disease

## 2016-10-15 VITALS — BP 136/66 | HR 68 | Ht 61.5 in | Wt 133.8 lb

## 2016-10-15 DIAGNOSIS — I2510 Atherosclerotic heart disease of native coronary artery without angina pectoris: Secondary | ICD-10-CM

## 2016-10-15 DIAGNOSIS — I5022 Chronic systolic (congestive) heart failure: Secondary | ICD-10-CM | POA: Diagnosis not present

## 2016-10-15 DIAGNOSIS — Z953 Presence of xenogenic heart valve: Secondary | ICD-10-CM | POA: Diagnosis not present

## 2016-10-15 DIAGNOSIS — I481 Persistent atrial fibrillation: Secondary | ICD-10-CM | POA: Diagnosis not present

## 2016-10-15 DIAGNOSIS — I4819 Other persistent atrial fibrillation: Secondary | ICD-10-CM

## 2016-10-15 NOTE — Patient Instructions (Addendum)
Medication Instructions:  Your physician recommends that you continue on your current medications as directed. Please refer to the Current Medication list given to you today.   Labwork: none  Testing/Procedures: none  Follow-Up: Your physician wants you to follow-up in:  6 months with Dr. Fletcher Anon.  You will receive a reminder letter in the mail two months in advance. If you don't receive a letter, please call our office to schedule the follow-up appointment.  Your physician recommends that you schedule a follow-up appointment with Dr. Caryl Comes for afib.    Any Other Special Instructions Will Be Listed Below (If Applicable).     If you need a refill on your cardiac medications before your next appointment, please call your pharmacy.

## 2016-10-15 NOTE — Progress Notes (Signed)
Cardiology Office Note   Date:  10/15/2016   ID:  [name], DOB Aug 22, 1935, MRN [id number]  PCP:  Ezequiel Kayser, MD  Cardiologist:   Kathlyn Sacramento, MD   Chief Complaint  Patient presents with  . other    Follow up from Echo. Pt. c/o fatigue & shortness of breath. Meds reviewed by the pt. verbally.       History of Present Illness: [name] is a 82 y.o. female who presents for  a follow-up visit regarding chronic systolic heart failure, atrial fibrillation and mitral regurgitation.  She has known history of COPD, breast cancer status post right partial mastectomy, hypothyroidism, hyperlipidemia and Mnire disease.   She underwent mitral valve replacement with a bioprosthetic valve, tricuspid valve repair, one-vessel CABG with LIMA to LAD and maze procedure in August 2016. This was complicated by sternal wound infection which required debridement. She also had complete heart block and underwent permanent pacemaker placement. She initially had worsening Ejection fraction after surgery but most recent echocardiogram in January 2018 showed an EF of 50-55% with intact mitral valve and normal pulmonary pressure. She is doing extremely well with no chest pain and significant improvement in dyspnea. No orthopnea or leg edema.  Past Medical History:  Diagnosis Date  . Anxiety   . Arthritis    "hands" (11/10/2015)  . CAD s/p CABG x 1    a. 04/2015 LIMA to LAD  . Cancer of right breast ([place]) 08/09/2001  . Chronic diastolic CHF (congestive heart failure) ([place])   . COPD (chronic obstructive pulmonary disease) ([place])   . COPD (chronic obstructive pulmonary disease) ([place])   . GERD (gastroesophageal reflux disease)   . Hyperlipidemia   . Hypertension   . Hypothyroidism   . 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 04/2015 Complete bilateral atrial lesion set using cryothermy and bipolar radiofrequency ablation with clipping of LA appendage  . Meniere disease   . On home oxygen  therapy    "2L at night" (11/10/2015)  . PAF (paroxysmal atrial fibrillation) ([place])   . Pneumonia ~ 2010  . PONV (postoperative nausea and vomiting)    Pt felt like eardrums were gonna pop  . Post-surgical complete heart block, symptomatic    a. 04/2015 s/p MDT [id number] Advisa DR MRI DC PPM.  . Presence of permanent cardiac pacemaker   . Pulmonary hypertension   . Severe Mitral Regurgitation s/p MV Repair    a. 04/2015 s/p 27 mm [facility] Mitral bovine bioprosthetic tissue valve  . Surgical wound, non healing - chest wall 11/10/2015  . Tricuspid Regurgitation s/p Repair    a. 04/2015 s/p 26 mm Edwards mc3 ring annuloplasty  . Wound infection 10/13/2015   Superficial sternal wound infection    Past Surgical History:  Procedure Laterality Date  . APPLICATION OF A-CELL OF CHEST/ABDOMEN N/A 11/21/2015   Procedure: APPLICATION OF A-CELL OF CHEST/ABDOMEN;  Surgeon: Loel Lofty Dillingham, DO;  Location: [place];  Service: Plastics;  Laterality: N/A;  . APPLICATION OF A-CELL OF CHEST/ABDOMEN Right 12/21/2015   Procedure: APPLICATION OF A-CELL OF RIGHT CHEST;  Surgeon: Loel Lofty Dillingham, DO;  Location: [place];  Service: Plastics;  Laterality: Right;  . APPLICATION OF A-CELL OF CHEST/ABDOMEN Right 01/11/2016   Procedure: APPLICATION OF A-CELL OF RIGHT CHEST;  Surgeon: Loel Lofty Dillingham, DO;  Location: [place];  Service: Plastics;  Laterality: Right;  . APPLICATION OF A-CELL OF CHEST/ABDOMEN Right 02/12/2016   Procedure: APPLICATION  OF A-CELL TO RIGHT CHEST WOUND;  Surgeon: Loel Lofty Dillingham, DO;  Location: [place];  Service: Plastics;  Laterality: Right;  . APPLICATION OF WOUND VAC N/A 05/09/2015   Procedure: APPLICATION OF WOUND VAC;  Surgeon: Rexene Alberts, MD;  Location: [place];  Service: Thoracic;  Laterality: N/A;  . APPLICATION OF WOUND VAC N/A 11/13/2015   Procedure: APPLICATION OF WOUND VAC;  Surgeon: Rexene Alberts, MD;  Location: [place];  Service: Thoracic;  Laterality: N/A;  . APPLICATION OF WOUND  VAC N/A 11/21/2015   Procedure: APPLICATION OF WOUND VAC;  Surgeon: Loel Lofty Dillingham, DO;  Location: [place];  Service: Plastics;  Laterality: N/A;  . APPLICATION OF WOUND VAC Right 12/21/2015   Procedure: APPLICATION OF WOUND VAC to right chest wall ;  Surgeon: Loel Lofty Dillingham, DO;  Location: [place];  Service: Plastics;  Laterality: Right;  . APPLICATION OF WOUND VAC Right 01/11/2016   Procedure: APPLICATION OF WOUND VAC RIGHT CHEST ;  Surgeon: Loel Lofty Dillingham, DO;  Location: [place];  Service: Plastics;  Laterality: Right;  . APPLICATION OF WOUND VAC Right 01/24/2016   Procedure: APPLICATION OF WOUND VAC RIGHT CHEST WALL;  Surgeon: Loel Lofty Dillingham, DO;  Location: [place];  Service: Plastics;  Laterality: Right;  . APPLICATION OF WOUND VAC Right 02/12/2016   Procedure: RE-APPLICATION OF WOUND VAC TO RIGHT CHEST WOUND;  Surgeon: Loel Lofty Dillingham, DO;  Location: [place];  Service: Plastics;  Laterality: Right;  . BREAST BIOPSY Left 11/25/06   neg  . BREAST BIOPSY Left 01/20/12   /clip-neg  . CARDIAC CATHETERIZATION  11/2013   [facility]  . CARDIAC CATHETERIZATION  10/2014   [facility]  . CARDIAC VALVE REPLACEMENT    . CATARACT EXTRACTION W/ INTRAOCULAR LENS  IMPLANT, BILATERAL Bilateral 2014  . CLIPPING OF ATRIAL APPENDAGE N/A 04/25/2015   Procedure: CLIPPING OF ATRIAL APPENDAGE;  Surgeon: Rexene Alberts, MD;  Location: [place];  Service: Open Heart Surgery;  Laterality: N/A;  . COCHLEAR IMPLANT Left 2005?  . CORONARY ARTERY BYPASS GRAFT N/A 04/25/2015   Procedure: CORONARY ARTERY BYPASS GRAFTING (CABG) x ONE, using left internal mammary artery;  Surgeon: Rexene Alberts, MD;  Location: [place];  Service: Open Heart Surgery;  Laterality: N/A;  . DILATION AND CURETTAGE OF UTERUS  "several before hysterectomy"  . EP IMPLANTABLE DEVICE N/A 05/02/2015   Procedure: Pacemaker Implant;  Surgeon: Thompson Grayer, MD;  Location: [place] [facility];  Service: Cardiovascular;  Laterality: N/A;  . I&D EXTREMITY Right  12/06/2015   Procedure: IRRIGATION AND DEBRIDEMENT RIGHT CHEST WALL WITH ACELL PLACEMENT AND VAC;  Surgeon: Loel Lofty Dillingham, DO;  Location: [place];  Service: Plastics;  Laterality: Right;  . INCISION AND DRAINAGE OF WOUND N/A 11/21/2015   Procedure: IRRIGATION AND DEBRIDEMENT WOUND;  Surgeon: Loel Lofty Dillingham, DO;  Location: [place];  Service: Plastics;  Laterality: N/A;  . INCISION AND DRAINAGE OF WOUND Right 12/21/2015   Procedure: IRRIGATION AND DEBRIDEMENT right chest wall WOUND;  Surgeon: Loel Lofty Dillingham, DO;  Location: [place];  Service: Plastics;  Laterality: Right;  right chest wall   . INCISION AND DRAINAGE OF WOUND Right 01/24/2016   Procedure: IRRIGATION AND DEBRIDEMENT RIGHT CHEST WALL WOUND;  Surgeon: Loel Lofty Dillingham, DO;  Location: [place];  Service: Plastics;  Laterality: Right;  . INCISION AND DRAINAGE OF WOUND Right 03/28/2016   Procedure: IRRIGATION AND DEBRIDEMENT RIGHT CHEST WALL WOUND WITH A Cell Placement;  Surgeon: Lyndee Leo  S Dillingham, DO;  Location: [place];  Service: Plastics;  Laterality: Right;  . INCISION AND DRAINAGE OF WOUND Right 05/17/2016   Procedure: IRRIGATION AND DEBRIDEMENT OF RIGHT CHEST WOUND WITH A CELL PLACEMENT;  Surgeon: Loel Lofty Dillingham, DO;  Location: [place];  Service: Plastics;  Laterality: Right;  . INSERT / REPLACE / REMOVE PACEMAKER    . IRRIGATION AND DEBRIDEMENT OF WOUND WITH SPLIT THICKNESS SKIN GRAFT Right 01/11/2016   Procedure: IRRIGATION AND DEBRIDEMENT OF RIGHT CHEST WOUND ;  Surgeon: Loel Lofty Dillingham, DO;  Location: [place];  Service: Plastics;  Laterality: Right;  . MASTECTOMY, PARTIAL Right 2002  . MAZE N/A 04/25/2015   Procedure: MAZE;  Surgeon: Rexene Alberts, MD;  Location: [place];  Service: Open Heart Surgery;  Laterality: N/A;  . MITRAL VALVE REPAIR N/A 04/25/2015   Procedure: MITRAL VALVE  REPLACEMENT using a 27 mm Edwards Perimount Magna Mitral Ease Valve;  Surgeon: Rexene Alberts, MD;  Location: [place];  Service: Open Heart Surgery;   Laterality: N/A;  . SKIN SPLIT GRAFT Right 02/12/2016   Procedure: IRRIGATION AND DEBRIDEMENT RIGHT CHEST WOUND;  Surgeon: Loel Lofty Dillingham, DO;  Location: [place];  Service: Plastics;  Laterality: Right;  . STERNAL WIRES REMOVAL N/A 06/05/2015   Procedure: STERNAL WIRES REMOVAL;  Surgeon: Rexene Alberts, MD;  Location: [place];  Service: Thoracic;  Laterality: N/A;  . STERNAL WOUND DEBRIDEMENT N/A 05/09/2015   Procedure: STERNAL WOUND DEBRIDEMENT;  Surgeon: Rexene Alberts, MD;  Location: [place];  Service: Thoracic;  Laterality: N/A;  . STERNAL WOUND DEBRIDEMENT N/A 11/13/2015   Procedure: Excisional drainage of RIGHT Chest wall mass and breast mass ;  Surgeon: Rexene Alberts, MD;  Location: [place];  Service: Thoracic;  Laterality: N/A;  . TEE WITHOUT CARDIOVERSION N/A 04/25/2015   Procedure: TRANSESOPHAGEAL ECHOCARDIOGRAM (TEE);  Surgeon: Rexene Alberts, MD;  Location: [place];  Service: Open Heart Surgery;  Laterality: N/A;  . TONSILLECTOMY    . TRAM Right 11/18/2015   Procedure: VRAM Vertical Rectus Abdominus Muscle Flap;  Surgeon: Loel Lofty Dillingham, DO;  Location: [place];  Service: Plastics;  Laterality: Right;  RIght Back  . TRICUSPID VALVE REPLACEMENT N/A 04/25/2015   Procedure: TRICUSPID VALVE REPAIR;  Surgeon: Rexene Alberts, MD;  Location: [place];  Service: Open Heart Surgery;  Laterality: N/A;  . VAGINAL HYSTERECTOMY       Current Outpatient Prescriptions  Medication Sig Dispense Refill  . acetaminophen (TYLENOL) 325 MG tablet Take 2 tablets (650 mg total) by mouth every 4 (four) hours as needed for mild pain (or Fever >/= 101).    Marland Kitchen aspirin EC 81 MG EC tablet Take 1 tablet (81 mg total) by mouth daily.    . benzonatate (TESSALON) 100 MG capsule Take 1 capsule (100 mg total) by mouth 3 (three) times daily as needed for cough. 20 capsule 0  . bisoprolol (ZEBETA) 10 MG tablet Take 1 tablet (10 mg total) by mouth daily. 30 tablet 5  . ferrous sulfate 325 (65 FE) MG tablet Take 325 mg by  mouth daily with breakfast.    . fluticasone (FLONASE) 50 MCG/ACT nasal spray Place 1 spray into both nostrils daily as needed for allergies.     . furosemide (LASIX) 20 MG tablet Take 20 mg by mouth 2 (two) times daily.     Marland Kitchen gabapentin (NEURONTIN) 100 MG capsule Take 200 mg by mouth at bedtime.   1  . ipratropium-albuterol (DUONEB)  0.5-2.5 (3) MG/3ML SOLN Take 3 mLs by nebulization every 6 (six) hours as needed. 360 mL 1  . levothyroxine (SYNTHROID, LEVOTHROID) 88 MCG tablet Take 88 mcg by mouth daily before breakfast.    . losartan (COZAAR) 100 MG tablet Take 1 tablet (100 mg total) by mouth daily. 30 tablet 3  . Multiple Vitamins-Minerals (PRESERVISION AREDS 2 PO) Take 2 tablets by mouth daily.    . pantoprazole (PROTONIX) 40 MG tablet Take 40 mg by mouth 2 (two) times daily.     . potassium chloride SA (K-DUR,KLOR-CON) 20 MEQ tablet Take 20 mEq by mouth daily.  2  . simvastatin (ZOCOR) 20 MG tablet Take 20 mg by mouth at bedtime.     Marland Kitchen spironolactone (ALDACTONE) 25 MG tablet Take 1 tablet (25 mg total) by mouth daily. 30 tablet 6  . warfarin (COUMADIN) 2.5 MG tablet Take as directed by [facility] 50 tablet 3   No current facility-administered medications for this visit.     Allergies:   Ace inhibitors; Augmentin [amoxicillin-pot clavulanate]; Penicillins; Nifedipine; Propranolol; Diazepam; Meperidine; and Propoxyphene    Social History:  The patient  reports that she quit smoking about 18 years ago. Her smoking use included Cigarettes. She has a 60.00 pack-year smoking history. She has never used smokeless tobacco. She reports that she drinks alcohol. She reports that she does not use drugs.   Family History:  The patient's family history includes Heart attack in her paternal uncle; Heart disease in her brother and father.    ROS:  Please see the history of present illness.   Otherwise, review of systems are positive for none.   All other systems are reviewed and negative.     PHYSICAL EXAM: VS:  BP 136/66 (BP Location: Left Arm, Patient Position: Sitting, Cuff Size: Normal)   Pulse 68   Ht 5' 1.5" (1.562 m)   Wt 133 lb 12 oz (60.7 kg)   BMI 24.86 kg/m  , BMI Body mass index is 24.86 kg/m. GEN: Well nourished, well developed, in no acute distress  HEENT: normal  Neck: no JVD, carotid bruits, or masses Cardiac: RRR; no murmurs, rubs, or gallops,no edema  Respiratory:  clear to auscultation bilaterally, normal work of breathing GI: soft, nontender, nondistended, + BS MS: no deformity or atrophy  Skin: warm and dry, no rash Neuro:  Strength and sensation are intact Psych: euthymic mood, full affect   EKG:  EKG is ordered today. The ekg ordered today demonstrates atrial paced rhythm , incomplete right bundle branch block, septal infarct with nonspecific T wave changes.  Recent Labs: 11/15/2015: ALT 18 06/29/2016: B Natriuretic Peptide 1,451.0 06/30/2016: BUN 22; Creatinine, Ser 0.63; Hemoglobin 11.0; Platelets 239; Potassium 4.7; Sodium 141    Lipid Panel No results found for: CHOL, TRIG, HDL, CHOLHDL, VLDL, LDLCALC, LDLDIRECT    Wt Readings from Last 3 Encounters:  10/15/16 133 lb 12 oz (60.7 kg)  09/13/16 128 lb (58.1 kg)  06/29/16 132 lb 4.8 oz (60 kg)       ASSESSMENT AND PLAN:  1.  Chronic systolic heart failure: She is doing reasonably well and appears to be euvolemic.  Most recent ejection fraction showed an EF of 50-55%. Continue small dose furosemide, spironolactone, losartan and bisoprolol.  2. Status post mitral valve replacement with a bioprosthetic valve: This was intact on most recent echocardiogram.  3. Coronary artery disease status post one-vessel LIMA to LAD: No anginal symptoms  4. Persistent atrial fibrillation: No evidence of recurrent  atrial fibrillation since her surgery and even after stopping amiodarone. I am sending her back to see Dr. Caryl Comes to see if we need to continue anticoagulation. She did have a surgical  maze procedure. if anticoagulation is needed long-term, we can consider switching her back to Eliquis.  Disposition:   FU with me in 6 months  Signed,  Kathlyn Sacramento, MD  10/15/2016 4:09 PM    [place]

## 2016-10-23 ENCOUNTER — Ambulatory Visit (INDEPENDENT_AMBULATORY_CARE_PROVIDER_SITE_OTHER): Payer: Medicare Other

## 2016-10-23 DIAGNOSIS — I2510 Atherosclerotic heart disease of native coronary artery without angina pectoris: Secondary | ICD-10-CM

## 2016-10-23 DIAGNOSIS — I340 Nonrheumatic mitral (valve) insufficiency: Secondary | ICD-10-CM

## 2016-10-23 DIAGNOSIS — Z5181 Encounter for therapeutic drug level monitoring: Secondary | ICD-10-CM

## 2016-10-23 DIAGNOSIS — Z953 Presence of xenogenic heart valve: Secondary | ICD-10-CM | POA: Diagnosis not present

## 2016-10-23 LAB — POCT INR: INR: 1.8

## 2016-10-29 ENCOUNTER — Ambulatory Visit (INDEPENDENT_AMBULATORY_CARE_PROVIDER_SITE_OTHER): Payer: Medicare Other | Admitting: *Deleted

## 2016-10-29 DIAGNOSIS — I442 Atrioventricular block, complete: Secondary | ICD-10-CM

## 2016-10-29 NOTE — Progress Notes (Signed)
Remote pacemaker transmission.   

## 2016-10-30 ENCOUNTER — Encounter: Payer: Self-pay | Admitting: Cardiology

## 2016-10-30 LAB — CUP PACEART REMOTE DEVICE CHECK
Brady Statistic AP VP Percent: 0.08 %
Brady Statistic AS VP Percent: 0.01 %
Brady Statistic RA Percent Paced: 79.75 %
Brady Statistic RV Percent Paced: 0.11 %
Implantable Lead Implant Date: 20160823
Implantable Lead Location: 753859
Implantable Lead Location: 753860
Implantable Lead Model: 5076
Implantable Lead Model: 5076
Lead Channel Impedance Value: 361 Ohm
Lead Channel Impedance Value: 399 Ohm
Lead Channel Impedance Value: 456 Ohm
Lead Channel Pacing Threshold Pulse Width: 0.4 ms
Lead Channel Sensing Intrinsic Amplitude: 10 mV
Lead Channel Setting Pacing Amplitude: 2 V
Lead Channel Setting Pacing Pulse Width: 0.4 ms
MDC IDC LEAD IMPLANT DT: 20160823
MDC IDC MSMT BATTERY REMAINING LONGEVITY: 100 mo
MDC IDC MSMT BATTERY VOLTAGE: 3.02 V
MDC IDC MSMT LEADCHNL RA IMPEDANCE VALUE: 418 Ohm
MDC IDC MSMT LEADCHNL RA PACING THRESHOLD AMPLITUDE: 0.5 V
MDC IDC MSMT LEADCHNL RA SENSING INTR AMPL: 1.875 mV
MDC IDC MSMT LEADCHNL RA SENSING INTR AMPL: 1.875 mV
MDC IDC MSMT LEADCHNL RV PACING THRESHOLD AMPLITUDE: 0.5 V
MDC IDC MSMT LEADCHNL RV PACING THRESHOLD PULSEWIDTH: 0.4 ms
MDC IDC MSMT LEADCHNL RV SENSING INTR AMPL: 10 mV
MDC IDC PG IMPLANT DT: 20160823
MDC IDC SESS DTM: 20180220174800
MDC IDC SET LEADCHNL RV PACING AMPLITUDE: 2.5 V
MDC IDC SET LEADCHNL RV SENSING SENSITIVITY: 2.8 mV
MDC IDC STAT BRADY AP VS PERCENT: 79.91 %
MDC IDC STAT BRADY AS VS PERCENT: 20 %

## 2016-11-06 ENCOUNTER — Ambulatory Visit (INDEPENDENT_AMBULATORY_CARE_PROVIDER_SITE_OTHER): Payer: Medicare Other

## 2016-11-06 DIAGNOSIS — I2510 Atherosclerotic heart disease of native coronary artery without angina pectoris: Secondary | ICD-10-CM

## 2016-11-06 DIAGNOSIS — Z953 Presence of xenogenic heart valve: Secondary | ICD-10-CM

## 2016-11-06 DIAGNOSIS — Z5181 Encounter for therapeutic drug level monitoring: Secondary | ICD-10-CM | POA: Diagnosis not present

## 2016-11-06 LAB — POCT INR: INR: 1.9

## 2016-11-11 ENCOUNTER — Other Ambulatory Visit: Payer: Self-pay | Admitting: *Deleted

## 2016-11-11 MED ORDER — WARFARIN SODIUM 2.5 MG PO TABS: ORAL_TABLET | ORAL | 3 refills | Status: DC

## 2016-11-20 ENCOUNTER — Ambulatory Visit (INDEPENDENT_AMBULATORY_CARE_PROVIDER_SITE_OTHER): Payer: Medicare Other

## 2016-11-20 DIAGNOSIS — Z953 Presence of xenogenic heart valve: Secondary | ICD-10-CM | POA: Diagnosis not present

## 2016-11-20 DIAGNOSIS — Z5181 Encounter for therapeutic drug level monitoring: Secondary | ICD-10-CM

## 2016-11-20 DIAGNOSIS — I2510 Atherosclerotic heart disease of native coronary artery without angina pectoris: Secondary | ICD-10-CM

## 2016-11-20 LAB — POCT INR: INR: 2.9

## 2016-12-10 ENCOUNTER — Ambulatory Visit (INDEPENDENT_AMBULATORY_CARE_PROVIDER_SITE_OTHER): Payer: Medicare Other | Admitting: Internal Medicine

## 2016-12-10 ENCOUNTER — Ambulatory Visit (INDEPENDENT_AMBULATORY_CARE_PROVIDER_SITE_OTHER): Payer: Medicare Other | Admitting: *Deleted

## 2016-12-10 VITALS — BP 120/58 | HR 68 | Ht 61.0 in | Wt 137.5 lb

## 2016-12-10 DIAGNOSIS — Z950 Presence of cardiac pacemaker: Secondary | ICD-10-CM | POA: Diagnosis not present

## 2016-12-10 DIAGNOSIS — I480 Paroxysmal atrial fibrillation: Secondary | ICD-10-CM

## 2016-12-10 DIAGNOSIS — Z5181 Encounter for therapeutic drug level monitoring: Secondary | ICD-10-CM | POA: Diagnosis not present

## 2016-12-10 DIAGNOSIS — I495 Sick sinus syndrome: Secondary | ICD-10-CM

## 2016-12-10 DIAGNOSIS — Z953 Presence of xenogenic heart valve: Secondary | ICD-10-CM | POA: Diagnosis not present

## 2016-12-10 DIAGNOSIS — I442 Atrioventricular block, complete: Secondary | ICD-10-CM

## 2016-12-10 DIAGNOSIS — I2510 Atherosclerotic heart disease of native coronary artery without angina pectoris: Secondary | ICD-10-CM

## 2016-12-10 LAB — CUP PACEART INCLINIC DEVICE CHECK
Date Time Interrogation Session: 20180403150037
Implantable Lead Implant Date: 20160823
Implantable Lead Implant Date: 20160823
Implantable Lead Location: 753859
Implantable Lead Location: 753860
MDC IDC PG IMPLANT DT: 20160823

## 2016-12-10 LAB — POCT INR: INR: 2.9

## 2016-12-10 NOTE — Patient Instructions (Signed)
Medication Instructions: - Your physician recommends that you continue on your current medications as directed. Please refer to the Current Medication list given to you today.  Labwork: - none ordered  Procedures/Testing: - none ordered  Follow-Up: - Remote monitoring is used to monitor your Pacemaker of ICD from home. This monitoring reduces the number of office visits required to check your device to one time per year. It allows [place] to keep an eye on the functioning of your device to ensure it is working properly. You are scheduled for a device check from home on 03/11/17. You may send your transmission at any time that day. If you have a wireless device, the transmission will be sent automatically. After your physician reviews your transmission, you will receive a postcard with your next transmission date.  - Your physician wants you to follow-up in: 6 months with Dr. Caryl Comes. You will receive a reminder letter in the mail two months in advance. If you don't receive a letter, please call our office to schedule the follow-up appointment   Any Additional Special Instructions Will Be Listed Below (If Applicable).     If you need a refill on your cardiac medications before your next appointment, please call your pharmacy.

## 2016-12-10 NOTE — Progress Notes (Signed)
Patient Care Team: Ezequiel Kayser, MD as PCP - General (Internal Medicine)   HPI  [name] is a 82 y.o. female Seen in follow-up for a pacemaker implanted 8/16 in the context of multi-valvular surgery bioprosthetic mitral valve replacement, tricuspid valve repair and MAZE operation. She developed postoperative complete heart block [facility])  She has a history of atrial fibrillation she also developed a sternal wound infection requiring debridement.  Records and Results Reviewed  Including hospital records. Echocardiogram 5/16 demonstrated normal LV function.  Subsequent echocardiogram demonstrated an ejection fraction of 30-35% in the context of RV apical pacing. This was inactivated which she was seen in 1/17 with immediate improvement in symptoms.  Echocardiogram 1/18 demonstrated normal LV function    Reassessment of LV function has been postponed because of the active sternal wound.   She complains of significant shortness of breath at 5-10 steps.  She does not have peripheral edema or nocturnal dyspnea.    Past Medical History:  Diagnosis Date  . Anxiety   . Arthritis    "hands" (11/10/2015)  . CAD s/p CABG x 1    a. 04/2015 LIMA to LAD  . Cancer of right breast ([place]) 08/09/2001  . Chronic diastolic CHF (congestive heart failure) ([place])   . COPD (chronic obstructive pulmonary disease) ([place])   . COPD (chronic obstructive pulmonary disease) ([place])   . GERD (gastroesophageal reflux disease)   . Hyperlipidemia   . Hypertension   . Hypothyroidism   . 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 04/2015 Complete bilateral atrial lesion set using cryothermy and bipolar radiofrequency ablation with clipping of LA appendage  . Meniere disease   . On home oxygen therapy    "2L at night" (11/10/2015)  . PAF (paroxysmal atrial fibrillation) ([name])   . Pneumonia ~ 2010  . PONV (postoperative nausea and vomiting)    Pt felt like eardrums were gonna pop  . Post-surgical complete heart  block, symptomatic    a. 04/2015 s/p MDT [id number] Advisa DR MRI DC PPM.  . Presence of permanent cardiac pacemaker   . Pulmonary hypertension   . Severe Mitral Regurgitation s/p MV Repair    a. 04/2015 s/p 27 mm [facility] Mitral bovine bioprosthetic tissue valve  . Surgical wound, non healing - chest wall 11/10/2015  . Tricuspid Regurgitation s/p Repair    a. 04/2015 s/p 26 mm Edwards mc3 ring annuloplasty  . Wound infection 10/13/2015   Superficial sternal wound infection    Past Surgical History:  Procedure Laterality Date  . APPLICATION OF A-CELL OF CHEST/ABDOMEN N/A 11/21/2015   Procedure: APPLICATION OF A-CELL OF CHEST/ABDOMEN;  Surgeon: Loel Lofty Dillingham, DO;  Location: [place];  Service: Plastics;  Laterality: N/A;  . APPLICATION OF A-CELL OF CHEST/ABDOMEN Right 12/21/2015   Procedure: APPLICATION OF A-CELL OF RIGHT CHEST;  Surgeon: Loel Lofty Dillingham, DO;  Location: [place];  Service: Plastics;  Laterality: Right;  . APPLICATION OF A-CELL OF CHEST/ABDOMEN Right 01/11/2016   Procedure: APPLICATION OF A-CELL OF RIGHT CHEST;  Surgeon: Loel Lofty Dillingham, DO;  Location: [place];  Service: Plastics;  Laterality: Right;  . APPLICATION OF A-CELL OF CHEST/ABDOMEN Right 02/12/2016   Procedure: APPLICATION OF A-CELL TO RIGHT CHEST WOUND;  Surgeon: Loel Lofty Dillingham, DO;  Location: [place];  Service: Plastics;  Laterality: Right;  . APPLICATION OF WOUND VAC N/A 05/09/2015   Procedure: APPLICATION OF WOUND VAC;  Surgeon: Rexene Alberts, MD;  Location:  [place] OR;  Service: Thoracic;  Laterality: N/A;  . APPLICATION OF WOUND VAC N/A 11/13/2015   Procedure: APPLICATION OF WOUND VAC;  Surgeon: Rexene Alberts, MD;  Location: [place];  Service: Thoracic;  Laterality: N/A;  . APPLICATION OF WOUND VAC N/A 11/21/2015   Procedure: APPLICATION OF WOUND VAC;  Surgeon: Loel Lofty Dillingham, DO;  Location: [place];  Service: Plastics;  Laterality: N/A;  . APPLICATION OF WOUND VAC Right 12/21/2015   Procedure: APPLICATION OF  WOUND VAC to right chest wall ;  Surgeon: Loel Lofty Dillingham, DO;  Location: [place];  Service: Plastics;  Laterality: Right;  . APPLICATION OF WOUND VAC Right 01/11/2016   Procedure: APPLICATION OF WOUND VAC RIGHT CHEST ;  Surgeon: Loel Lofty Dillingham, DO;  Location: [place];  Service: Plastics;  Laterality: Right;  . APPLICATION OF WOUND VAC Right 01/24/2016   Procedure: APPLICATION OF WOUND VAC RIGHT CHEST WALL;  Surgeon: Loel Lofty Dillingham, DO;  Location: [place];  Service: Plastics;  Laterality: Right;  . APPLICATION OF WOUND VAC Right 02/12/2016   Procedure: RE-APPLICATION OF WOUND VAC TO RIGHT CHEST WOUND;  Surgeon: Loel Lofty Dillingham, DO;  Location: [place];  Service: Plastics;  Laterality: Right;  . BREAST BIOPSY Left 11/25/06   neg  . BREAST BIOPSY Left 01/20/12   /clip-neg  . CARDIAC CATHETERIZATION  11/2013   [facility]  . CARDIAC CATHETERIZATION  10/2014   [facility]  . CARDIAC VALVE REPLACEMENT    . CATARACT EXTRACTION W/ INTRAOCULAR LENS  IMPLANT, BILATERAL Bilateral 2014  . CLIPPING OF ATRIAL APPENDAGE N/A 04/25/2015   Procedure: CLIPPING OF ATRIAL APPENDAGE;  Surgeon: Rexene Alberts, MD;  Location: [place];  Service: Open Heart Surgery;  Laterality: N/A;  . COCHLEAR IMPLANT Left 2005?  . CORONARY ARTERY BYPASS GRAFT N/A 04/25/2015   Procedure: CORONARY ARTERY BYPASS GRAFTING (CABG) x ONE, using left internal mammary artery;  Surgeon: Rexene Alberts, MD;  Location: [place];  Service: Open Heart Surgery;  Laterality: N/A;  . DILATION AND CURETTAGE OF UTERUS  "several before hysterectomy"  . EP IMPLANTABLE DEVICE N/A 05/02/2015   Procedure: Pacemaker Implant;  Surgeon: Thompson Grayer, MD;  Location: [place] CV LAB;  Service: Cardiovascular;  Laterality: N/A;  . I&D EXTREMITY Right 12/06/2015   Procedure: IRRIGATION AND DEBRIDEMENT RIGHT CHEST WALL WITH ACELL PLACEMENT AND VAC;  Surgeon: Loel Lofty Dillingham, DO;  Location: [place];  Service: Plastics;  Laterality: Right;  . INCISION AND DRAINAGE OF WOUND N/A  11/21/2015   Procedure: IRRIGATION AND DEBRIDEMENT WOUND;  Surgeon: Loel Lofty Dillingham, DO;  Location: [place];  Service: Plastics;  Laterality: N/A;  . INCISION AND DRAINAGE OF WOUND Right 12/21/2015   Procedure: IRRIGATION AND DEBRIDEMENT right chest wall WOUND;  Surgeon: Loel Lofty Dillingham, DO;  Location: [place];  Service: Plastics;  Laterality: Right;  right chest wall   . INCISION AND DRAINAGE OF WOUND Right 01/24/2016   Procedure: IRRIGATION AND DEBRIDEMENT RIGHT CHEST WALL WOUND;  Surgeon: Loel Lofty Dillingham, DO;  Location: [place];  Service: Plastics;  Laterality: Right;  . INCISION AND DRAINAGE OF WOUND Right 03/28/2016   Procedure: IRRIGATION AND DEBRIDEMENT RIGHT CHEST WALL WOUND WITH A Cell Placement;  Surgeon: Loel Lofty Dillingham, DO;  Location: [place];  Service: Plastics;  Laterality: Right;  . INCISION AND DRAINAGE OF WOUND Right 05/17/2016   Procedure: IRRIGATION AND DEBRIDEMENT OF RIGHT CHEST WOUND WITH A CELL PLACEMENT;  Surgeon: Loel Lofty Dillingham, DO;  [facility]  OR;  Service: Clinical cytogeneticist;  Laterality: Right;  . INSERT / REPLACE / REMOVE PACEMAKER    . IRRIGATION AND DEBRIDEMENT OF WOUND WITH SPLIT THICKNESS SKIN GRAFT Right 01/11/2016   Procedure: IRRIGATION AND DEBRIDEMENT OF RIGHT CHEST WOUND ;  Surgeon: Loel Lofty Dillingham, DO;  Location: [place];  Service: Plastics;  Laterality: Right;  . MASTECTOMY, PARTIAL Right 2002  . MAZE N/A 04/25/2015   Procedure: MAZE;  Surgeon: Rexene Alberts, MD;  Location: [place];  Service: Open Heart Surgery;  Laterality: N/A;  . MITRAL VALVE REPAIR N/A 04/25/2015   Procedure: MITRAL VALVE  REPLACEMENT using a 27 mm Edwards Perimount Magna Mitral Ease Valve;  Surgeon: Rexene Alberts, MD;  Location: [place];  Service: Open Heart Surgery;  Laterality: N/A;  . SKIN SPLIT GRAFT Right 02/12/2016   Procedure: IRRIGATION AND DEBRIDEMENT RIGHT CHEST WOUND;  Surgeon: Loel Lofty Dillingham, DO;  Location: [place];  Service: Plastics;  Laterality: Right;  . STERNAL WIRES  REMOVAL N/A 06/05/2015   Procedure: STERNAL WIRES REMOVAL;  Surgeon: Rexene Alberts, MD;  Location: [place];  Service: Thoracic;  Laterality: N/A;  . STERNAL WOUND DEBRIDEMENT N/A 05/09/2015   Procedure: STERNAL WOUND DEBRIDEMENT;  Surgeon: Rexene Alberts, MD;  Location: [place];  Service: Thoracic;  Laterality: N/A;  . STERNAL WOUND DEBRIDEMENT N/A 11/13/2015   Procedure: Excisional drainage of RIGHT Chest wall mass and breast mass ;  Surgeon: Rexene Alberts, MD;  Location: [place];  Service: Thoracic;  Laterality: N/A;  . TEE WITHOUT CARDIOVERSION N/A 04/25/2015   Procedure: TRANSESOPHAGEAL ECHOCARDIOGRAM (TEE);  Surgeon: Rexene Alberts, MD;  Location: [place];  Service: Open Heart Surgery;  Laterality: N/A;  . TONSILLECTOMY    . TRAM Right 11/18/2015   Procedure: VRAM Vertical Rectus Abdominus Muscle Flap;  Surgeon: Loel Lofty Dillingham, DO;  Location: [place];  Service: Plastics;  Laterality: Right;  RIght Back  . TRICUSPID VALVE REPLACEMENT N/A 04/25/2015   Procedure: TRICUSPID VALVE REPAIR;  Surgeon: Rexene Alberts, MD;  Location: [place];  Service: Open Heart Surgery;  Laterality: N/A;  . VAGINAL HYSTERECTOMY      Current Outpatient Prescriptions  Medication Sig Dispense Refill  . acetaminophen (TYLENOL) 325 MG tablet Take 2 tablets (650 mg total) by mouth every 4 (four) hours as needed for mild pain (or Fever >/= 101).    Marland Kitchen aspirin EC 81 MG EC tablet Take 1 tablet (81 mg total) by mouth daily.    . benzonatate (TESSALON) 100 MG capsule Take 1 capsule (100 mg total) by mouth 3 (three) times daily as needed for cough. 20 capsule 0  . bisoprolol (ZEBETA) 10 MG tablet Take 1 tablet (10 mg total) by mouth daily. 30 tablet 5  . ferrous sulfate 325 (65 FE) MG tablet Take 325 mg by mouth daily with breakfast.    . fluticasone (FLONASE) 50 MCG/ACT nasal spray Place 1 spray into both nostrils daily as needed for allergies.     . furosemide (LASIX) 20 MG tablet Take 20 mg by mouth 2 (two) times daily.     Marland Kitchen  gabapentin (NEURONTIN) 100 MG capsule Take 200 mg by mouth at bedtime.   1  . ipratropium-albuterol (DUONEB) 0.5-2.5 (3) MG/3ML SOLN Take 3 mLs by nebulization every 6 (six) hours as needed. 360 mL 1  . levothyroxine (SYNTHROID, LEVOTHROID) 88 MCG tablet Take 88 mcg by mouth daily before breakfast.    . losartan (COZAAR) 100 MG tablet Take 1 tablet (  100 mg total) by mouth daily. 30 tablet 3  . Multiple Vitamins-Minerals (PRESERVISION AREDS 2 PO) Take 2 tablets by mouth daily.    . pantoprazole (PROTONIX) 40 MG tablet Take 40 mg by mouth 2 (two) times daily.     . potassium chloride SA (K-DUR,KLOR-CON) 20 MEQ tablet Take 20 mEq by mouth daily.  2  . simvastatin (ZOCOR) 20 MG tablet Take 20 mg by mouth at bedtime.     Marland Kitchen spironolactone (ALDACTONE) 25 MG tablet Take 1 tablet (25 mg total) by mouth daily. 30 tablet 6  . warfarin (COUMADIN) 2.5 MG tablet Take as directed by [facility] 50 tablet 3   No current facility-administered medications for this visit.     Allergies  Allergen Reactions  . Ace Inhibitors Cough  . Augmentin [Amoxicillin-Pot Clavulanate] Swelling    JOINTS, PAIN  . Penicillins Swelling    SWOLLEN JOINTS  Has patient had a PCN reaction causing immediate rash, facial/tongue/throat swelling, SOB or lightheadedness with hypotension: Yes Has patient had a PCN reaction causing severe rash involving mucus membranes or skin necrosis: No Has patient had a PCN reaction that required hospitalization No Has patient had a PCN reaction occurring within the last 10 years: No If all of the above answers are "NO", then may proceed with Cephalosporin use.   . Nifedipine Other (See Comments)    UNSPECIFIED  . Propranolol Other (See Comments)    UNSPECIFIED  . Diazepam Other (See Comments)    Made her "hyper"  . Meperidine Other (See Comments)    Made her "hyper"  . Propoxyphene Other (See Comments)    Made her "hyper"      Review of Systems negative except from HPI and  PMH  Physical Exam BP (!) 120/58 (BP Location: Left Arm, Patient Position: Sitting, Cuff Size: Normal)   Pulse 68   Ht 5\' 1"  (1.549 m)   Wt 137 lb 8 oz (62.4 kg)   BMI 25.98 kg/m  Well developed and well nourished in no acute distress HENT normal E scleral and icterus clear Neck Supple JVP flat; carotids brisk and full Clear to ausculation Device pocket well healed; without hematoma or erythema.  There is no tethering Regular rate and rhythm, no murmurs gallops or rub RRR and 2/6 M Soft with active bowel sounds No clubbing cyanosis  Edema Alert and oriented, grossly normal motor and sensory function Skin Warm and Dry  ECG demonstrates atrial pacing at a rate of 68  36/10/44    Assessment and  Plan   atrial fibrillation-paroxysmal status post maze operation  Complete heart block -Intermittent  Sinus node dysfunction   First-degree AV block  Status post mitral valve replacement-bioprosthetic with tricuspid valve repair  Pacemaker-[employer]   We will reprogram the patient's pacemaker to increase her rate response both by taking her ADL rate from 90--100 and increasing the slope 3-4 for exercise. Walking her in the office she noted significant improvement in her dyspnea with exertion.  The issue of atrial fibrillation requiring anticoagulation is more challenging in this lady with a prior maze. I told him we will follow him again in see her in 6 months as there is some suggestion of ongoing episodes of atrial fibrillation on her device  On Anticoagulation;  No bleeding issues

## 2016-12-10 NOTE — Progress Notes (Signed)
Spoke with Fuller Canada, pharmacist. She reviewed patient's record with INR today of 2.9.  Advised to continue with current dosing of coumadin and follow up in 1 month with Erika in the coumadin clinic.

## 2017-01-08 ENCOUNTER — Ambulatory Visit (INDEPENDENT_AMBULATORY_CARE_PROVIDER_SITE_OTHER): Payer: Medicare Other | Admitting: *Deleted

## 2017-01-08 DIAGNOSIS — Z5181 Encounter for therapeutic drug level monitoring: Secondary | ICD-10-CM

## 2017-01-08 DIAGNOSIS — Z953 Presence of xenogenic heart valve: Secondary | ICD-10-CM

## 2017-01-08 LAB — POCT INR: INR: 2.9

## 2017-01-09 ENCOUNTER — Telehealth: Payer: Self-pay | Admitting: Pulmonary Disease

## 2017-01-09 NOTE — Telephone Encounter (Signed)
Per Huey Romans patient is requesting that Huey Romans come and pickup her o2 equipment and remove from home.  Pt was seen in June 2017 and was advised to come in prn.  No appointment has been made to discuss this. Pt signed a "release of liability for removal of equipment against medical advice form".    Phone message taken as an FYI for Dr. Alva Garnet. Rhonda J Cobb  Form placed in Dr. Alva Garnet folder to initial off on and to have scanned into Epic. Rhonda J Cobb

## 2017-01-10 ENCOUNTER — Other Ambulatory Visit: Payer: Self-pay | Admitting: Pulmonary Disease

## 2017-01-10 NOTE — Telephone Encounter (Signed)
Please advise on message below.

## 2017-01-10 NOTE — Telephone Encounter (Signed)
pt spouse calling he would like to buy a travel Oxygen for patient  But was told he needs a prescription Please advise

## 2017-01-11 IMAGING — CR DG CHEST 2V
2 series · 2 of 2 positions shown · non-contrast
Comparison: CT 03/17/2015.

CLINICAL DATA: Mitral regurgitation .

EXAM:
CHEST  2 VIEW

[w chest pa]
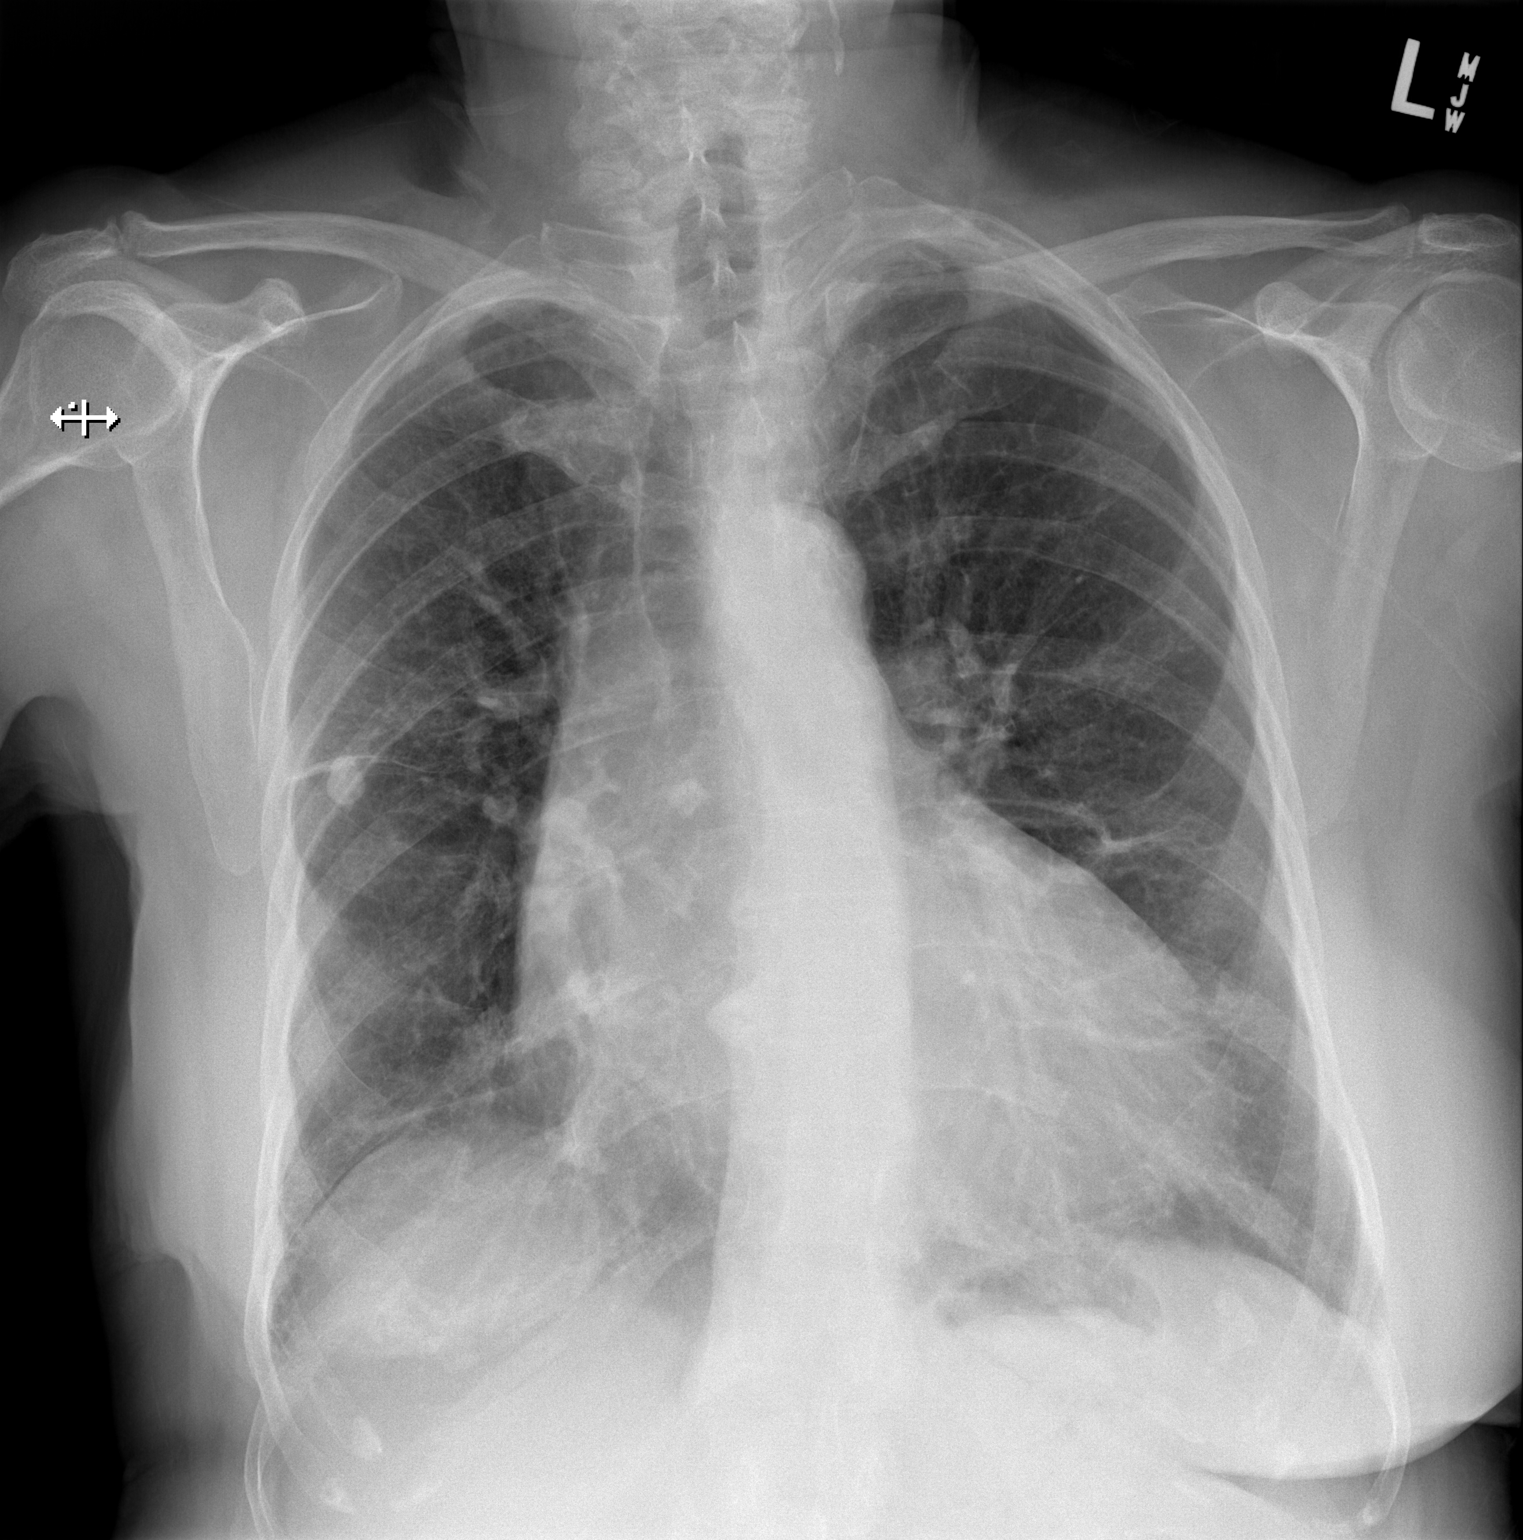

[w chest lat]
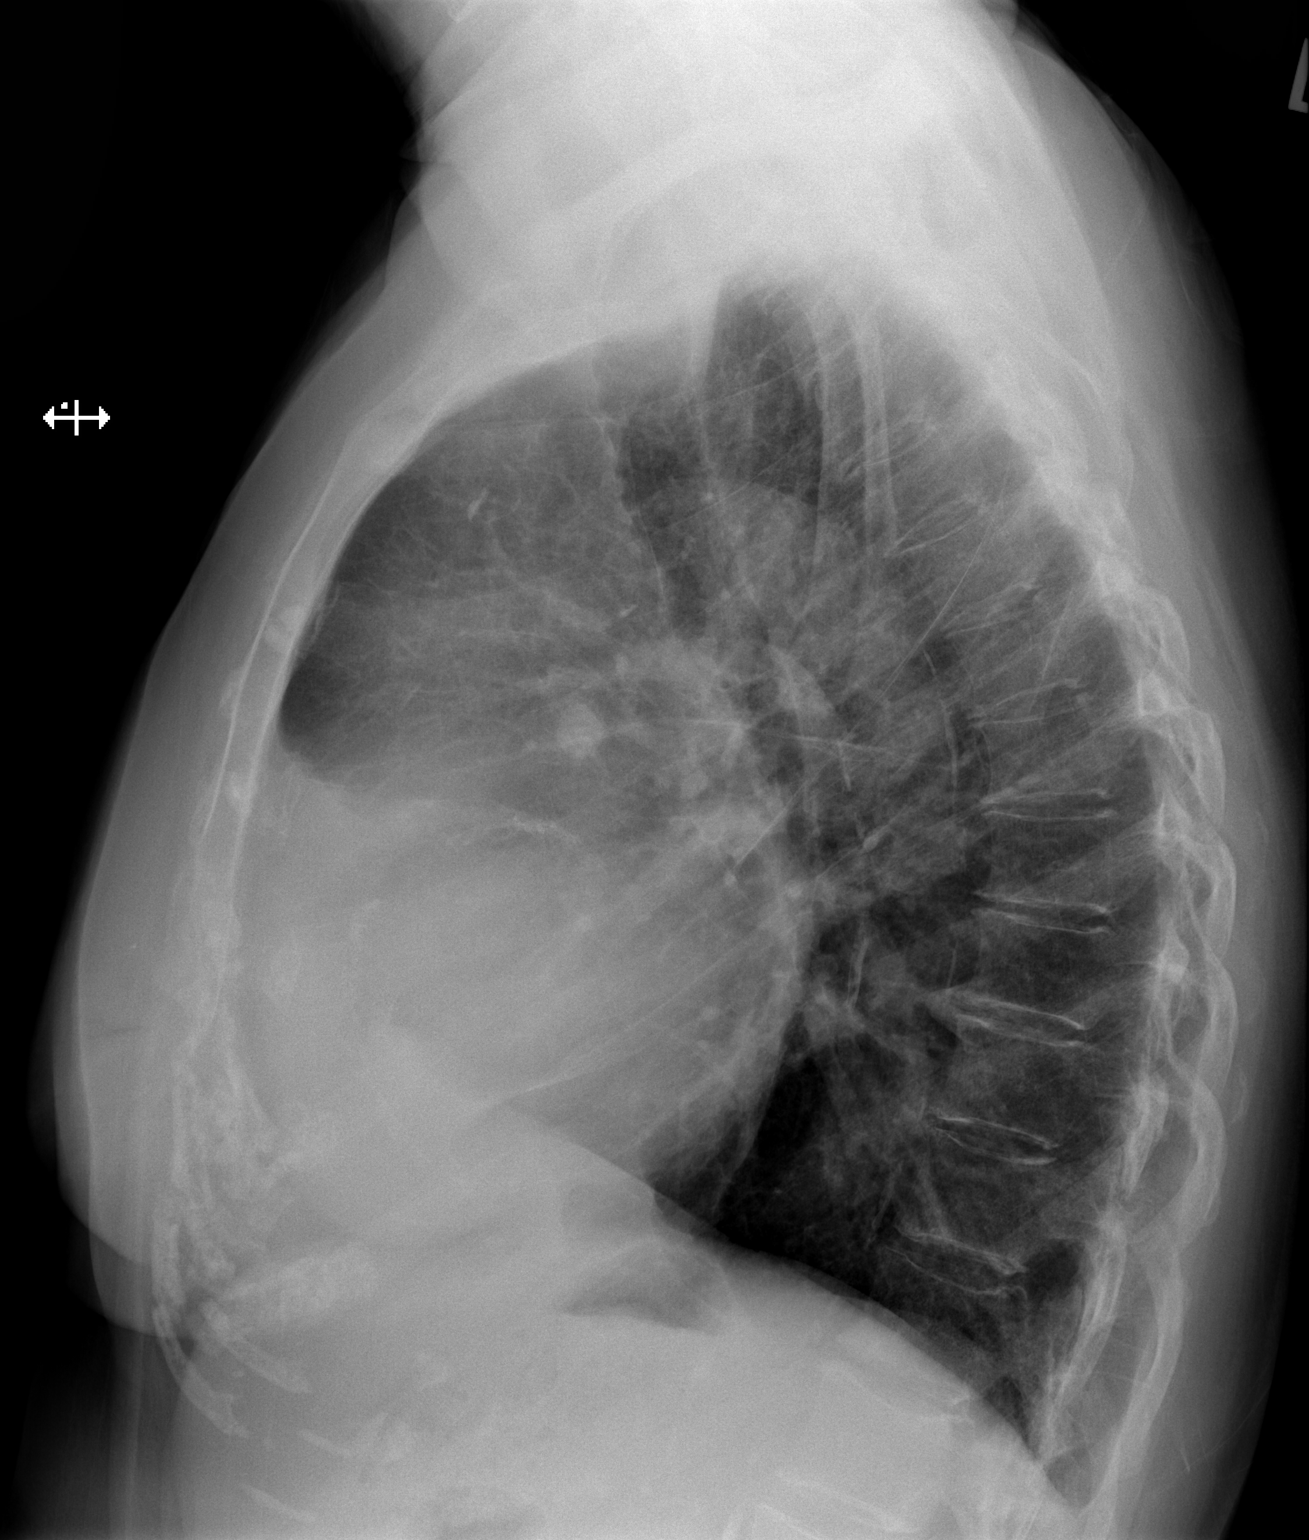

[2 of 2 positions shown; findings below may reference images not displayed]

FINDINGS: Mediastinum and hilar structures normal. Cardiomegaly with normal
pulmonary vascularity. No focal pulmonary infiltrate. No pleural
effusion or pneumothorax. Calcified pulmonary nodules not on the
right consistent granulomas.
IMPRESSION: 1. Cardiomegaly, no pulmonary venous congestion.
2. Calcified pulmonary nodules consistent granulomas. Reference made
to prior chest CT report 03/17/2015 for further recommendations
regarding 4 mm nodule left upper lobe.

## 2017-01-13 ENCOUNTER — Other Ambulatory Visit: Payer: Self-pay | Admitting: Internal Medicine

## 2017-01-13 DIAGNOSIS — I25119 Atherosclerotic heart disease of native coronary artery with unspecified angina pectoris: Secondary | ICD-10-CM | POA: Insufficient documentation

## 2017-01-13 DIAGNOSIS — Z853 Personal history of malignant neoplasm of breast: Secondary | ICD-10-CM

## 2017-01-13 DIAGNOSIS — Z1231 Encounter for screening mammogram for malignant neoplasm of breast: Secondary | ICD-10-CM

## 2017-01-14 NOTE — Telephone Encounter (Signed)
I'm not sure what "a travel oxygen" means. Please clarify  [name]

## 2017-01-14 NOTE — Telephone Encounter (Signed)
LMOVM for pt or spouse to return call for clarification.

## 2017-01-14 NOTE — Telephone Encounter (Signed)
Spoke with spouse and he states they are wanting an RX for a machine called [employer] that weighs approx. 18lbs. States they are paying for it out of pocket but needs an RX for the company they are going through. Is it ok for me to print a script for this machine? Thanks.

## 2017-01-15 MED ORDER — AMBULATORY NON FORMULARY MEDICATION: 0 refills | Status: DC

## 2017-01-15 NOTE — Addendum Note (Signed)
Addended by: Oscar La R on: 01/15/2017 10:45 AM   Modules accepted: Orders

## 2017-01-15 NOTE — Addendum Note (Signed)
Addended by: Oscar La R on: 01/15/2017 10:51 AM   Modules accepted: Orders

## 2017-01-15 NOTE — Telephone Encounter (Signed)
Ok per DS to do script for oxygen machine. Will print script for DS to sign.

## 2017-01-15 NOTE — Telephone Encounter (Signed)
LMOVM for pt and spouse that RX is ready for pick and to call back with any questions. Nothing further needed.

## 2017-01-21 ENCOUNTER — Ambulatory Visit
Admission: RE | Admit: 2017-01-21 | Discharge: 2017-01-21 | Disposition: A | Discharge status: Home / Self Care | Payer: Medicare Other | Source: Ambulatory Visit | Attending: Internal Medicine | Admitting: Internal Medicine

## 2017-01-21 DIAGNOSIS — Z853 Personal history of malignant neoplasm of breast: Secondary | ICD-10-CM

## 2017-01-21 DIAGNOSIS — Z1231 Encounter for screening mammogram for malignant neoplasm of breast: Secondary | ICD-10-CM | POA: Diagnosis not present

## 2017-02-12 ENCOUNTER — Ambulatory Visit (INDEPENDENT_AMBULATORY_CARE_PROVIDER_SITE_OTHER): Payer: Medicare Other | Admitting: *Deleted

## 2017-02-12 DIAGNOSIS — Z953 Presence of xenogenic heart valve: Secondary | ICD-10-CM

## 2017-02-12 DIAGNOSIS — Z5181 Encounter for therapeutic drug level monitoring: Secondary | ICD-10-CM | POA: Diagnosis not present

## 2017-02-12 DIAGNOSIS — I2510 Atherosclerotic heart disease of native coronary artery without angina pectoris: Secondary | ICD-10-CM

## 2017-02-12 LAB — POCT INR: INR: 2.9

## 2017-03-08 ENCOUNTER — Other Ambulatory Visit: Payer: Self-pay | Admitting: Cardiovascular Disease

## 2017-03-10 NOTE — Telephone Encounter (Signed)
Refill Request.  

## 2017-03-11 ENCOUNTER — Telehealth: Payer: Self-pay | Admitting: Cardiology

## 2017-03-11 ENCOUNTER — Ambulatory Visit (INDEPENDENT_AMBULATORY_CARE_PROVIDER_SITE_OTHER): Payer: Medicare Other | Admitting: *Deleted

## 2017-03-11 DIAGNOSIS — I442 Atrioventricular block, complete: Secondary | ICD-10-CM | POA: Diagnosis not present

## 2017-03-11 NOTE — Telephone Encounter (Signed)
Spoke with pt and reminded pt of remote transmission that is due today. Pt verbalized understanding.   

## 2017-03-13 LAB — CUP PACEART REMOTE DEVICE CHECK
Brady Statistic AP VP Percent: 0.28 %
Brady Statistic AP VS Percent: 94.89 %
Brady Statistic AS VP Percent: 0.01 %
Brady Statistic RA Percent Paced: 93.47 %
Implantable Lead Implant Date: 20160823
Implantable Lead Location: 753859
Implantable Lead Model: 5076
Lead Channel Impedance Value: 380 Ohm
Lead Channel Impedance Value: 437 Ohm
Lead Channel Impedance Value: 456 Ohm
Lead Channel Pacing Threshold Amplitude: 0.5 V
Lead Channel Pacing Threshold Pulse Width: 0.4 ms
Lead Channel Pacing Threshold Pulse Width: 0.4 ms
Lead Channel Sensing Intrinsic Amplitude: 11.375 mV
Lead Channel Sensing Intrinsic Amplitude: 11.375 mV
Lead Channel Sensing Intrinsic Amplitude: 2.25 mV
Lead Channel Setting Pacing Amplitude: 2 V
Lead Channel Setting Pacing Amplitude: 2.5 V
Lead Channel Setting Pacing Pulse Width: 0.4 ms
MDC IDC LEAD IMPLANT DT: 20160823
MDC IDC LEAD LOCATION: 753860
MDC IDC MSMT BATTERY REMAINING LONGEVITY: 97 mo
MDC IDC MSMT BATTERY VOLTAGE: 3.02 V
MDC IDC MSMT LEADCHNL RA PACING THRESHOLD AMPLITUDE: 0.5 V
MDC IDC MSMT LEADCHNL RA SENSING INTR AMPL: 2.25 mV
MDC IDC MSMT LEADCHNL RV IMPEDANCE VALUE: 475 Ohm
MDC IDC PG IMPLANT DT: 20160823
MDC IDC SESS DTM: 20180703174250
MDC IDC SET LEADCHNL RV SENSING SENSITIVITY: 2.8 mV
MDC IDC STAT BRADY AS VS PERCENT: 4.82 %
MDC IDC STAT BRADY RV PERCENT PACED: 0.38 %

## 2017-03-13 NOTE — Progress Notes (Signed)
Remote pacemaker transmission.   

## 2017-03-14 ENCOUNTER — Encounter: Payer: Self-pay | Admitting: Cardiology

## 2017-03-26 ENCOUNTER — Ambulatory Visit (INDEPENDENT_AMBULATORY_CARE_PROVIDER_SITE_OTHER): Payer: Medicare Other | Admitting: *Deleted

## 2017-03-26 DIAGNOSIS — Z5181 Encounter for therapeutic drug level monitoring: Secondary | ICD-10-CM | POA: Diagnosis not present

## 2017-03-26 DIAGNOSIS — Z953 Presence of xenogenic heart valve: Secondary | ICD-10-CM | POA: Diagnosis not present

## 2017-03-26 DIAGNOSIS — I340 Nonrheumatic mitral (valve) insufficiency: Secondary | ICD-10-CM | POA: Diagnosis not present

## 2017-03-26 DIAGNOSIS — I2510 Atherosclerotic heart disease of native coronary artery without angina pectoris: Secondary | ICD-10-CM

## 2017-03-26 LAB — POCT INR: INR: 3.3

## 2017-04-18 ENCOUNTER — Ambulatory Visit (INDEPENDENT_AMBULATORY_CARE_PROVIDER_SITE_OTHER): Payer: Medicare Other | Admitting: Cardiovascular Disease

## 2017-04-18 ENCOUNTER — Encounter: Payer: Self-pay | Admitting: Cardiovascular Disease

## 2017-04-18 ENCOUNTER — Ambulatory Visit (INDEPENDENT_AMBULATORY_CARE_PROVIDER_SITE_OTHER): Payer: Medicare Other

## 2017-04-18 VITALS — BP 140/62 | HR 74 | Ht 61.0 in | Wt 135.5 lb

## 2017-04-18 DIAGNOSIS — I2510 Atherosclerotic heart disease of native coronary artery without angina pectoris: Secondary | ICD-10-CM

## 2017-04-18 DIAGNOSIS — I481 Persistent atrial fibrillation: Secondary | ICD-10-CM | POA: Diagnosis not present

## 2017-04-18 DIAGNOSIS — Z5181 Encounter for therapeutic drug level monitoring: Secondary | ICD-10-CM

## 2017-04-18 DIAGNOSIS — I5022 Chronic systolic (congestive) heart failure: Secondary | ICD-10-CM | POA: Diagnosis not present

## 2017-04-18 DIAGNOSIS — Z953 Presence of xenogenic heart valve: Secondary | ICD-10-CM

## 2017-04-18 DIAGNOSIS — Z9889 Other specified postprocedural states: Secondary | ICD-10-CM

## 2017-04-18 DIAGNOSIS — I340 Nonrheumatic mitral (valve) insufficiency: Secondary | ICD-10-CM

## 2017-04-18 DIAGNOSIS — I4819 Other persistent atrial fibrillation: Secondary | ICD-10-CM

## 2017-04-18 LAB — POCT INR: INR: 2.7

## 2017-04-18 NOTE — Progress Notes (Signed)
Cardiology Office Note   Date:  04/18/2017   ID:  [name], DOB Nov 20, 1934, MRN [id number]  PCP:  Ezequiel Kayser, MD  Cardiologist:   Kathlyn Sacramento, MD   Chief Complaint  Patient presents with  . other    6 month follow up. Patient c/o SOB. Meds reviewed verbally with patient.      History of Present Illness: [name] is a 82 y.o. female who presents for  a follow-up visit regarding chronic systolic heart failure, atrial fibrillation and mitral regurgitation.  She has known history of COPD, breast cancer status post right partial mastectomy, hypothyroidism, hyperlipidemia and Mnire disease.   She underwent mitral valve replacement with a bioprosthetic valve, tricuspid valve repair, one-vessel CABG with LIMA to LAD and maze procedure in August 2016. This was complicated by sternal wound infection which required debridement. She also had complete heart block and underwent permanent pacemaker placement. She initially had worsening Ejection fraction after surgery but most recent echocardiogram in January 2018 showed an EF of 50-55% with intact mitral valve and normal pulmonary pressure.  She has been doing well with no chest pain. She has stable exertional dyspnea and fatigue.She continues to be on anticoagulation with warfarin with no bleeding complications.   Past Medical History:  Diagnosis Date  . Anxiety   . Arthritis    "hands" (11/10/2015)  . Breast cancer ([place]) 2002   right breast  . CAD s/p CABG x 1    a. 04/2015 LIMA to LAD  . Cancer of right breast ([place]) 08/09/2001  . Chronic diastolic CHF (congestive heart failure) ([place])   . COPD (chronic obstructive pulmonary disease) ([place])   . COPD (chronic obstructive pulmonary disease) ([place])   . GERD (gastroesophageal reflux disease)   . Hyperlipidemia   . Hypertension   . Hypothyroidism   . 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 04/2015 Complete bilateral atrial lesion set using cryothermy and bipolar  radiofrequency ablation with clipping of LA appendage  . Meniere disease   . On home oxygen therapy    "2L at night" (11/10/2015)  . PAF (paroxysmal atrial fibrillation) ([place])   . Pneumonia ~ 2010  . PONV (postoperative nausea and vomiting)    Pt felt like eardrums were gonna pop  . Post-surgical complete heart block, symptomatic    a. 04/2015 s/p MDT [id number] Advisa DR MRI DC PPM.  . Presence of permanent cardiac pacemaker   . Pulmonary hypertension ([place])   . Severe Mitral Regurgitation s/p MV Repair    a. 04/2015 s/p 27 mm [facility] Mitral bovine bioprosthetic tissue valve  . Surgical wound, non healing - chest wall 11/10/2015  . Tricuspid Regurgitation s/p Repair    a. 04/2015 s/p 26 mm Edwards mc3 ring annuloplasty  . Wound infection 10/13/2015   Superficial sternal wound infection    Past Surgical History:  Procedure Laterality Date  . APPLICATION OF A-CELL OF CHEST/ABDOMEN N/A 11/21/2015   Procedure: APPLICATION OF A-CELL OF CHEST/ABDOMEN;  Surgeon: Loel Lofty Dillingham, DO;  Location: [facility];  Service: Plastics;  Laterality: N/A;  . APPLICATION OF A-CELL OF CHEST/ABDOMEN Right 12/21/2015   Procedure: APPLICATION OF A-CELL OF RIGHT CHEST;  Surgeon: Loel Lofty Dillingham, DO;  Location: [place];  Service: Plastics;  Laterality: Right;  . APPLICATION OF A-CELL OF CHEST/ABDOMEN Right 01/11/2016   Procedure: APPLICATION OF A-CELL OF RIGHT CHEST;  Surgeon: Loel Lofty Dillingham, DO;  Location: [place];  Service: Plastics;  Laterality: Right;  . APPLICATION OF A-CELL OF CHEST/ABDOMEN Right 02/12/2016   Procedure: APPLICATION OF A-CELL TO RIGHT CHEST WOUND;  Surgeon: Loel Lofty Dillingham, DO;  Location: [place];  Service: Plastics;  Laterality: Right;  . APPLICATION OF WOUND VAC N/A 05/09/2015   Procedure: APPLICATION OF WOUND VAC;  Surgeon: Rexene Alberts, MD;  Location: [place];  Service: Thoracic;  Laterality: N/A;  . APPLICATION OF WOUND VAC N/A 11/13/2015   Procedure: APPLICATION OF WOUND VAC;  Surgeon:  Rexene Alberts, MD;  Location: [place];  Service: Thoracic;  Laterality: N/A;  . APPLICATION OF WOUND VAC N/A 11/21/2015   Procedure: APPLICATION OF WOUND VAC;  Surgeon: Loel Lofty Dillingham, DO;  Location: [place];  Service: Plastics;  Laterality: N/A;  . APPLICATION OF WOUND VAC Right 12/21/2015   Procedure: APPLICATION OF WOUND VAC to right chest wall ;  Surgeon: Loel Lofty Dillingham, DO;  Location: [place];  Service: Plastics;  Laterality: Right;  . APPLICATION OF WOUND VAC Right 01/11/2016   Procedure: APPLICATION OF WOUND VAC RIGHT CHEST ;  Surgeon: Loel Lofty Dillingham, DO;  Location: [place];  Service: Plastics;  Laterality: Right;  . APPLICATION OF WOUND VAC Right 01/24/2016   Procedure: APPLICATION OF WOUND VAC RIGHT CHEST WALL;  Surgeon: Loel Lofty Dillingham, DO;  Location: [place];  Service: Plastics;  Laterality: Right;  . APPLICATION OF WOUND VAC Right 02/12/2016   Procedure: RE-APPLICATION OF WOUND VAC TO RIGHT CHEST WOUND;  Surgeon: Loel Lofty Dillingham, DO;  Location: [place];  Service: Plastics;  Laterality: Right;  . BREAST BIOPSY Left 11/25/06   neg  . BREAST BIOPSY Left 01/20/12   /clip-neg  . CARDIAC CATHETERIZATION  11/2013   [facility]  . CARDIAC CATHETERIZATION  10/2014   [facility]  . CARDIAC VALVE REPLACEMENT    . CATARACT EXTRACTION W/ INTRAOCULAR LENS  IMPLANT, BILATERAL Bilateral 2014  . CLIPPING OF ATRIAL APPENDAGE N/A 04/25/2015   Procedure: CLIPPING OF ATRIAL APPENDAGE;  Surgeon: Rexene Alberts, MD;  Location: [place];  Service: Open Heart Surgery;  Laterality: N/A;  . COCHLEAR IMPLANT Left 2005?  . CORONARY ARTERY BYPASS GRAFT N/A 04/25/2015   Procedure: CORONARY ARTERY BYPASS GRAFTING (CABG) x ONE, using left internal mammary artery;  Surgeon: Rexene Alberts, MD;  Location: [place];  Service: Open Heart Surgery;  Laterality: N/A;  . DILATION AND CURETTAGE OF UTERUS  "several before hysterectomy"  . EP IMPLANTABLE DEVICE N/A 05/02/2015   Procedure: Pacemaker Implant;  Surgeon: Thompson Grayer, MD;   Location: [place] [facility];  Service: Cardiovascular;  Laterality: N/A;  . I&D EXTREMITY Right 12/06/2015   Procedure: IRRIGATION AND DEBRIDEMENT RIGHT CHEST WALL WITH ACELL PLACEMENT AND VAC;  Surgeon: Loel Lofty Dillingham, DO;  Location: [place];  Service: Plastics;  Laterality: Right;  . INCISION AND DRAINAGE OF WOUND N/A 11/21/2015   Procedure: IRRIGATION AND DEBRIDEMENT WOUND;  Surgeon: Loel Lofty Dillingham, DO;  Location: [place];  Service: Plastics;  Laterality: N/A;  . INCISION AND DRAINAGE OF WOUND Right 12/21/2015   Procedure: IRRIGATION AND DEBRIDEMENT right chest wall WOUND;  Surgeon: Loel Lofty Dillingham, DO;  Location: [place];  Service: Plastics;  Laterality: Right;  right chest wall   . INCISION AND DRAINAGE OF WOUND Right 01/24/2016   Procedure: IRRIGATION AND DEBRIDEMENT RIGHT CHEST WALL WOUND;  Surgeon: Loel Lofty Dillingham, DO;  Location: [place];  Service: Plastics;  Laterality: Right;  . INCISION AND DRAINAGE OF WOUND Right 03/28/2016  Procedure: IRRIGATION AND DEBRIDEMENT RIGHT CHEST WALL WOUND WITH A Cell Placement;  Surgeon: Loel Lofty Dillingham, DO;  Location: [place];  Service: Plastics;  Laterality: Right;  . INCISION AND DRAINAGE OF WOUND Right 05/17/2016   Procedure: IRRIGATION AND DEBRIDEMENT OF RIGHT CHEST WOUND WITH A CELL PLACEMENT;  Surgeon: Loel Lofty Dillingham, DO;  Location: [place];  Service: Plastics;  Laterality: Right;  . INSERT / REPLACE / REMOVE PACEMAKER    . IRRIGATION AND DEBRIDEMENT OF WOUND WITH SPLIT THICKNESS SKIN GRAFT Right 01/11/2016   Procedure: IRRIGATION AND DEBRIDEMENT OF RIGHT CHEST WOUND ;  Surgeon: Loel Lofty Dillingham, DO;  Location: [place];  Service: Plastics;  Laterality: Right;  . MASTECTOMY, PARTIAL Right 2002   positive  . MAZE N/A 04/25/2015   Procedure: MAZE;  Surgeon: Rexene Alberts, MD;  Location: [place];  Service: Open Heart Surgery;  Laterality: N/A;  . MITRAL VALVE REPAIR N/A 04/25/2015   Procedure: MITRAL VALVE  REPLACEMENT using a 27 mm Edwards  Perimount Magna Mitral Ease Valve;  Surgeon: Rexene Alberts, MD;  Location: [place];  Service: Open Heart Surgery;  Laterality: N/A;  . SKIN SPLIT GRAFT Right 02/12/2016   Procedure: IRRIGATION AND DEBRIDEMENT RIGHT CHEST WOUND;  Surgeon: Loel Lofty Dillingham, DO;  Location: [place];  Service: Plastics;  Laterality: Right;  . STERNAL WIRES REMOVAL N/A 06/05/2015   Procedure: STERNAL WIRES REMOVAL;  Surgeon: Rexene Alberts, MD;  Location: [place];  Service: Thoracic;  Laterality: N/A;  . STERNAL WOUND DEBRIDEMENT N/A 05/09/2015   Procedure: STERNAL WOUND DEBRIDEMENT;  Surgeon: Rexene Alberts, MD;  Location: [place];  Service: Thoracic;  Laterality: N/A;  . STERNAL WOUND DEBRIDEMENT N/A 11/13/2015   Procedure: Excisional drainage of RIGHT Chest wall mass and breast mass ;  Surgeon: Rexene Alberts, MD;  Location: [place];  Service: Thoracic;  Laterality: N/A;  . TEE WITHOUT CARDIOVERSION N/A 04/25/2015   Procedure: TRANSESOPHAGEAL ECHOCARDIOGRAM (TEE);  Surgeon: Rexene Alberts, MD;  Location: [place];  Service: Open Heart Surgery;  Laterality: N/A;  . TONSILLECTOMY    . TRAM Right 11/18/2015   Procedure: VRAM Vertical Rectus Abdominus Muscle Flap;  Surgeon: Loel Lofty Dillingham, DO;  Location: [place];  Service: Plastics;  Laterality: Right;  RIght Back  . TRICUSPID VALVE REPLACEMENT N/A 04/25/2015   Procedure: TRICUSPID VALVE REPAIR;  Surgeon: Rexene Alberts, MD;  Location: [place];  Service: Open Heart Surgery;  Laterality: N/A;  . VAGINAL HYSTERECTOMY       Current Outpatient Prescriptions  Medication Sig Dispense Refill  . acetaminophen (TYLENOL) 325 MG tablet Take 2 tablets (650 mg total) by mouth every 4 (four) hours as needed for mild pain (or Fever >/= 101).    . AMBULATORY NON FORMULARY MEDICATION Inogen at Home Oxygen machine with tubing. DX: COPD DX Code: J44.9 1 each 0  . aspirin EC 81 MG EC tablet Take 1 tablet (81 mg total) by mouth daily.    . benzonatate (TESSALON) 100 MG capsule Take 1 capsule (100  mg total) by mouth 3 (three) times daily as needed for cough. 20 capsule 0  . bisoprolol (ZEBETA) 10 MG tablet Take 1 tablet (10 mg total) by mouth daily. 30 tablet 5  . ferrous sulfate 325 (65 FE) MG tablet Take 325 mg by mouth daily with breakfast.    . fluticasone (FLONASE) 50 MCG/ACT nasal spray Place 1 spray into both nostrils daily as needed for allergies.     Marland Kitchen  furosemide (LASIX) 20 MG tablet Take 20 mg by mouth 2 (two) times daily.     Marland Kitchen gabapentin (NEURONTIN) 100 MG capsule Take 200 mg by mouth at bedtime.   1  . ipratropium-albuterol (DUONEB) 0.5-2.5 (3) MG/3ML SOLN Take 3 mLs by nebulization every 6 (six) hours as needed. 360 mL 1  . levothyroxine (SYNTHROID, LEVOTHROID) 88 MCG tablet Take 88 mcg by mouth daily before breakfast.    . losartan (COZAAR) 100 MG tablet Take 1 tablet (100 mg total) by mouth daily. 30 tablet 3  . Multiple Vitamins-Minerals (PRESERVISION AREDS 2 PO) Take 2 tablets by mouth daily.    . pantoprazole (PROTONIX) 40 MG tablet Take 40 mg by mouth 2 (two) times daily.     . potassium chloride SA (K-DUR,KLOR-CON) 20 MEQ tablet Take 20 mEq by mouth daily.  2  . simvastatin (ZOCOR) 20 MG tablet Take 20 mg by mouth at bedtime.     Marland Kitchen spironolactone (ALDACTONE) 25 MG tablet Take 1 tablet (25 mg total) by mouth daily. 30 tablet 6  . warfarin (COUMADIN) 2.5 MG tablet TAKE AS DIRECTED BY COUMADIN CLINIC 55 tablet 3   No current facility-administered medications for this visit.     Allergies:   Ace inhibitors; Augmentin [amoxicillin-pot clavulanate]; Penicillins; Nifedipine; Propranolol; Diazepam; Meperidine; and Propoxyphene    Social History:  The patient  reports that she quit smoking about 19 years ago. Her smoking use included Cigarettes. She has a 60.00 pack-year smoking history. She has never used smokeless tobacco. She reports that she drinks alcohol. She reports that she does not use drugs.   Family History:  The patient's family history includes Breast cancer in  her maternal aunt and paternal aunt; Heart attack in her paternal uncle; Heart disease in her brother and father.    ROS:  Please see the history of present illness.   Otherwise, review of systems are positive for none.   All other systems are reviewed and negative.    PHYSICAL EXAM: VS:  BP 140/62 (BP Location: Left Arm, Patient Position: Sitting, Cuff Size: Normal)   Pulse 74   Ht 5\' 1"  (1.549 m)   Wt 135 lb 8 oz (61.5 kg)   BMI 25.60 kg/m  , BMI Body mass index is 25.6 kg/m. GEN: Well nourished, well developed, in no acute distress  HEENT: normal  Neck: no JVD, carotid bruits, or masses Cardiac: RRR; no murmurs, rubs, or gallops,no edema  Respiratory:  clear to auscultation bilaterally, normal work of breathing GI: soft, nontender, nondistended, + BS MS: no deformity or atrophy  Skin: warm and dry, no rash Neuro:  Strength and sensation are intact Psych: euthymic mood, full affect   EKG:  EKG is ordered today. The ekg ordered today demonstrates atrial paced rhythm , incomplete right bundle branch block, septal infarct with nonspecific T wave changes.  Recent Labs: 06/29/2016: B Natriuretic Peptide 1,451.0 06/30/2016: BUN 22; Creatinine, Ser 0.63; Hemoglobin 11.0; Platelets 239; Potassium 4.7; Sodium 141    Lipid Panel No results found for: CHOL, TRIG, HDL, CHOLHDL, VLDL, LDLCALC, LDLDIRECT    Wt Readings from Last 3 Encounters:  04/18/17 135 lb 8 oz (61.5 kg)  12/10/16 137 lb 8 oz (62.4 kg)  10/15/16 133 lb 12 oz (60.7 kg)       ASSESSMENT AND PLAN:  1.  Chronic systolic heart failure: She is doing reasonably well and appears to be euvolemic.  Most recent ejection fraction showed an EF of 50-55%. Continue small dose  furosemide, spironolactone, losartan and bisoprolol. I reviewed her labs from May which overall were unremarkable.  2. Status post mitral valve replacement with a bioprosthetic valve: This was intact on most recent echocardiogram.  3. Coronary artery  disease status post one-vessel LIMA to LAD: No anginal symptoms  4. Persistent atrial fibrillation: No evidence of recurrent atrial fibrillation since her surgery and even after stopping amiodarone.  She was seen by Dr. Caryl Comes and she continues to be on warfarin. We have been considering stopping anticoagulation but should follow device interrogation.   Disposition:   FU with me in 6 months  Signed,  Kathlyn Sacramento, MD  04/18/2017 2:41 PM    [place]

## 2017-04-18 NOTE — Patient Instructions (Signed)
Medication Instructions: Continue same medications.   Labwork: None.   Procedures/Testing: None.   Follow-Up: 6 months with Dr. Phelan Goers.   Any Additional Special Instructions Will Be Listed Below (If Applicable).     If you need a refill on your cardiac medications before your next appointment, please call your pharmacy.   

## 2017-04-21 ENCOUNTER — Encounter: Payer: Medicare Other | Admitting: Thoracic Surgery (Cardiothoracic Vascular Surgery)

## 2017-04-22 ENCOUNTER — Encounter: Payer: Medicare Other | Admitting: Thoracic Surgery (Cardiothoracic Vascular Surgery)

## 2017-05-09 IMAGING — CR DG CHEST 2V
1 series · 2 of 2 positions shown · non-contrast
Comparison: 10/27/2014

CLINICAL DATA: Cough.  History of heart surgery.

EXAM:
CHEST  2 VIEW

[Series 1: dg chest 2 view · 0.14mm/px · 2 of 2 slices shown]
[im 1/2]
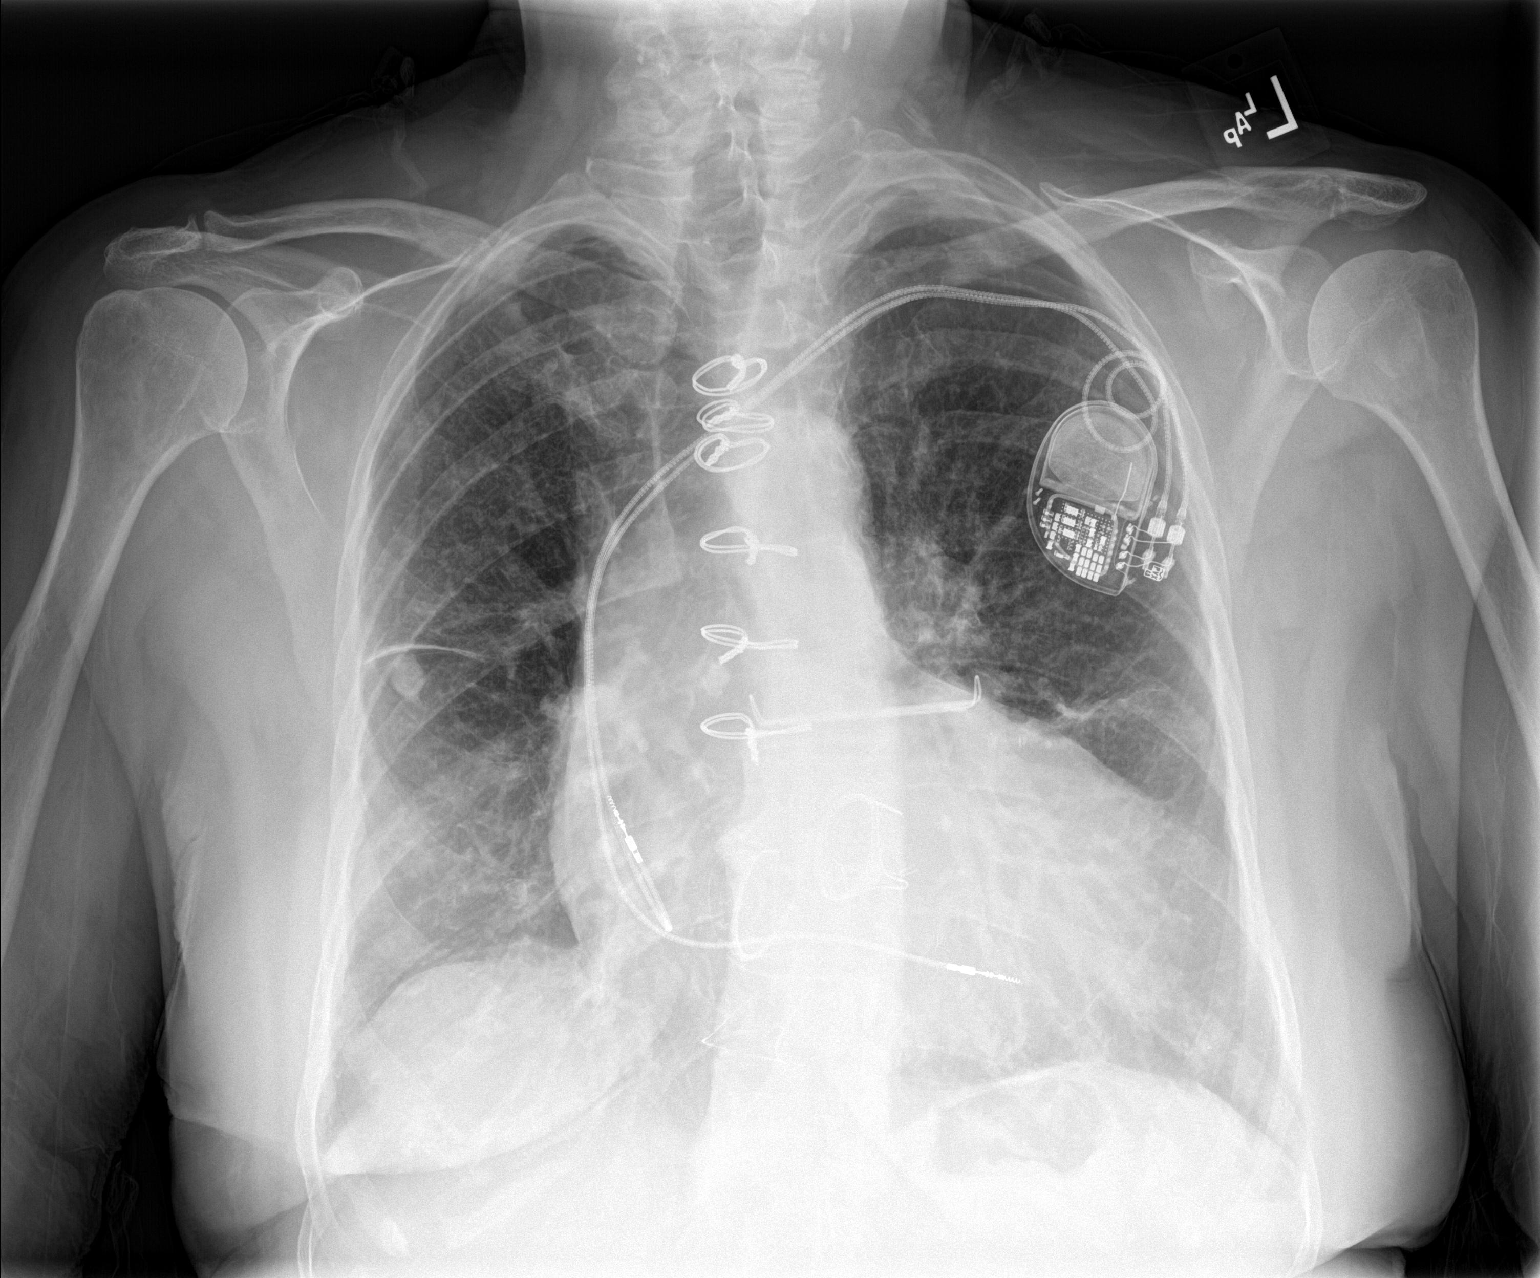
[im 2/2]
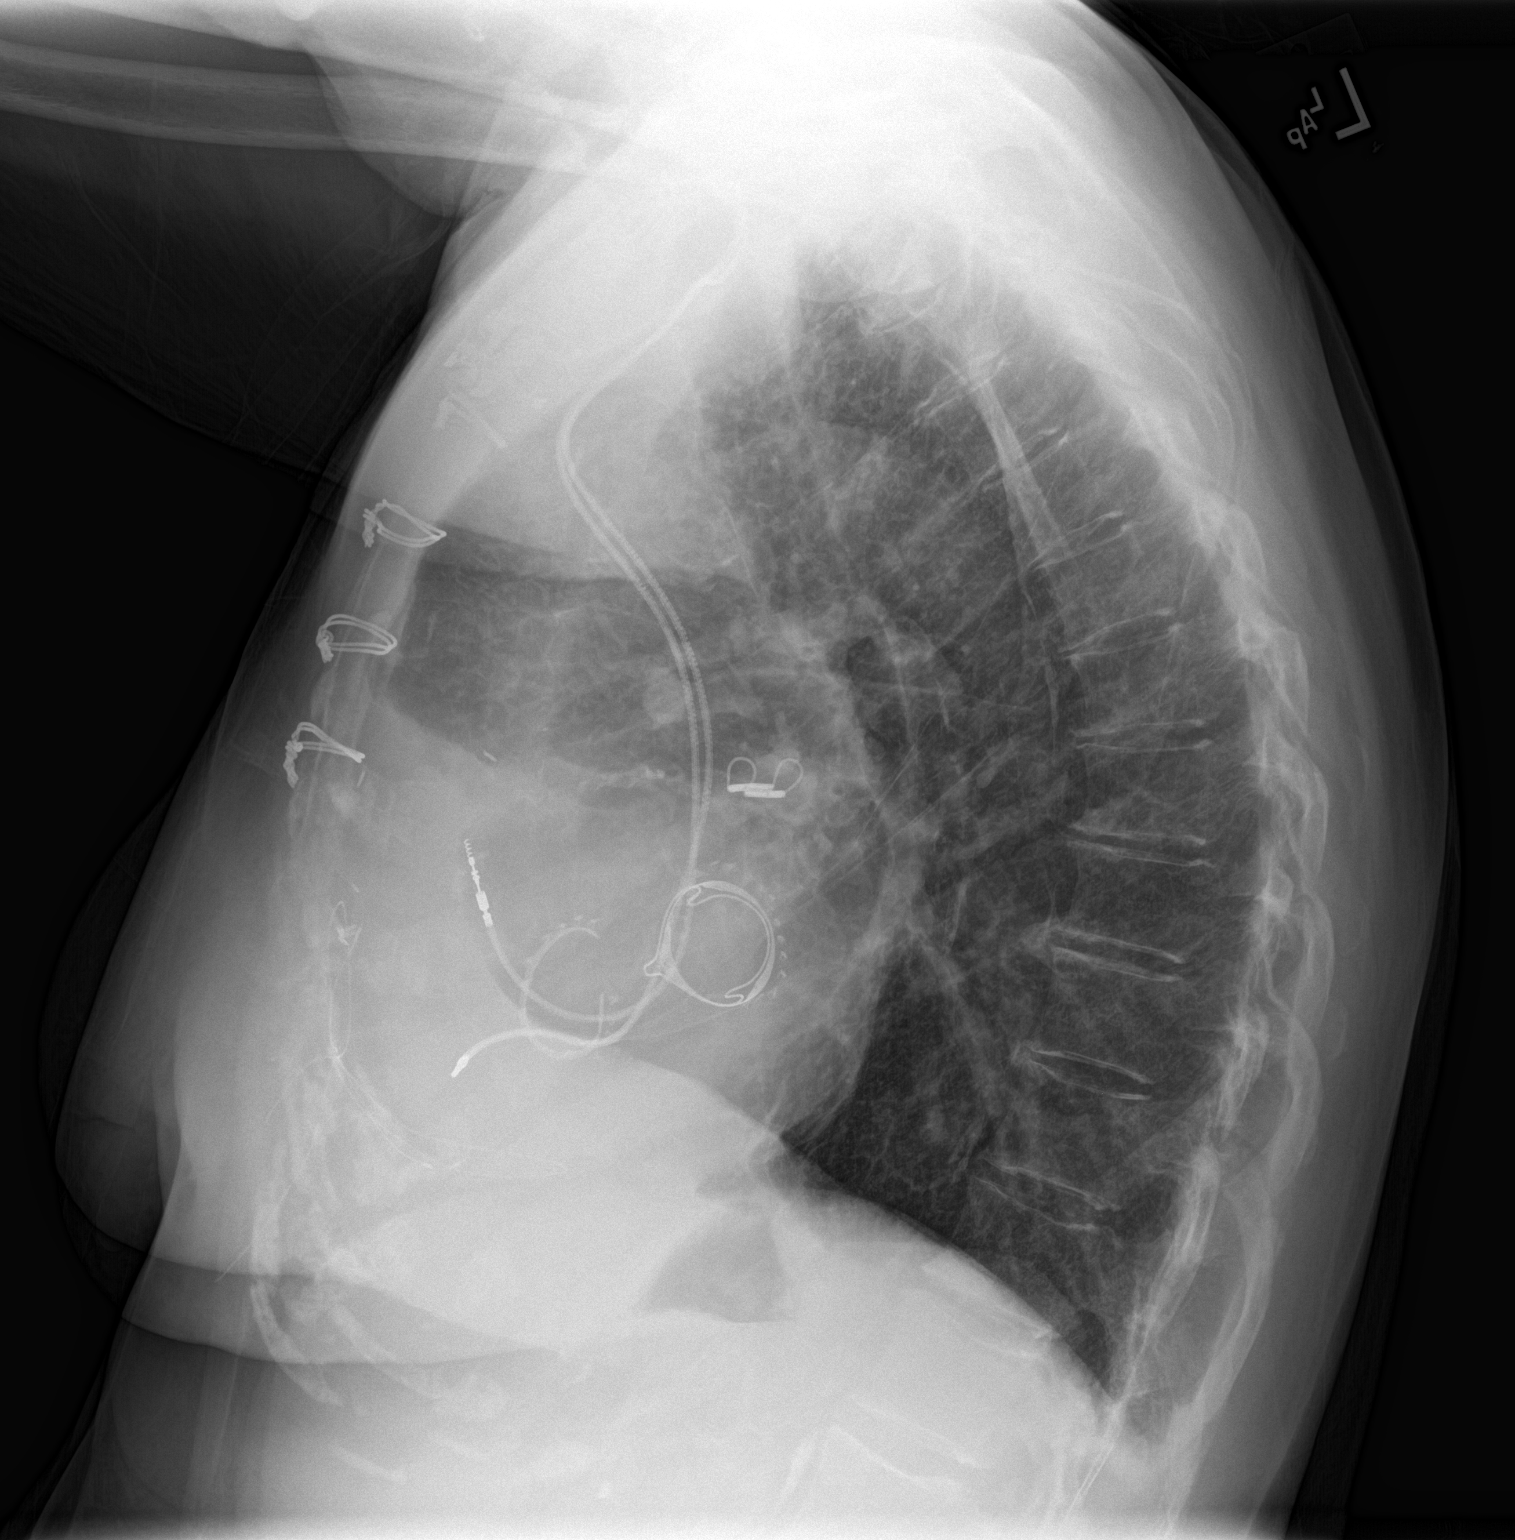

[2 of 2 positions shown; findings below may reference images not displayed]

FINDINGS: Stable enlargement of the cardiac silhouette. There is a prosthetic
mitral valve and evidence for a tricuspid annuloplasty. There is a
left atrial appendage clip. Dual chamber left cardiac pacemaker is
present. There is a chronic large calcification along the right side
of the chest. Chronic linear densities in both lungs related to
areas of scarring. There is no significant pleural fluid. No acute
bone abnormality. Densities along the anterior aspect of the chest
are probably related to postoperative change.
IMPRESSION: No acute chest findings.

Postoperative changes as described.

Chronic changes in both lungs.

## 2017-05-14 ENCOUNTER — Ambulatory Visit (INDEPENDENT_AMBULATORY_CARE_PROVIDER_SITE_OTHER): Payer: Medicare Other

## 2017-05-14 DIAGNOSIS — Z953 Presence of xenogenic heart valve: Secondary | ICD-10-CM | POA: Diagnosis not present

## 2017-05-14 DIAGNOSIS — I340 Nonrheumatic mitral (valve) insufficiency: Secondary | ICD-10-CM | POA: Diagnosis not present

## 2017-05-14 DIAGNOSIS — Z5181 Encounter for therapeutic drug level monitoring: Secondary | ICD-10-CM

## 2017-05-14 DIAGNOSIS — I4891 Unspecified atrial fibrillation: Secondary | ICD-10-CM

## 2017-05-14 LAB — POCT INR: INR: 3.6

## 2017-05-19 ENCOUNTER — Encounter: Payer: Self-pay | Admitting: Thoracic Surgery (Cardiothoracic Vascular Surgery)

## 2017-05-19 ENCOUNTER — Ambulatory Visit (INDEPENDENT_AMBULATORY_CARE_PROVIDER_SITE_OTHER): Payer: Medicare Other | Admitting: Thoracic Surgery (Cardiothoracic Vascular Surgery)

## 2017-05-19 VITALS — BP 140/76 | HR 86 | Resp 20 | Ht 61.0 in | Wt 137.0 lb

## 2017-05-19 DIAGNOSIS — I2510 Atherosclerotic heart disease of native coronary artery without angina pectoris: Secondary | ICD-10-CM | POA: Diagnosis not present

## 2017-05-19 DIAGNOSIS — I5042 Chronic combined systolic (congestive) and diastolic (congestive) heart failure: Secondary | ICD-10-CM | POA: Diagnosis not present

## 2017-05-19 DIAGNOSIS — Z9889 Other specified postprocedural states: Secondary | ICD-10-CM

## 2017-05-19 DIAGNOSIS — Z951 Presence of aortocoronary bypass graft: Secondary | ICD-10-CM | POA: Diagnosis not present

## 2017-05-19 DIAGNOSIS — Z953 Presence of xenogenic heart valve: Secondary | ICD-10-CM | POA: Diagnosis not present

## 2017-05-19 DIAGNOSIS — Z8679 Personal history of other diseases of the circulatory system: Secondary | ICD-10-CM

## 2017-05-19 NOTE — Patient Instructions (Signed)

## 2017-05-19 NOTE — Progress Notes (Signed)
[address]       [place] [address]             [phone]     CARDIOTHORACIC SURGERY OFFICE NOTE  Referring Provider is Wellington Hampshire, MD  Primary Electrophysiologist is Deboraha Sprang, MD PCP is Ezequiel Kayser, MD   HPI:  Patient is an 82 year old female with multiple medical problems including chronic combined systolic and diastolic congestive heart failure, coronary artery disease, hypertension, atrial fibrillation on long-term warfarin anticoagulation, complete heart block status post permanent pacemaker placement, COPD, hyperlipidemia, and Mnire's disease who returns to the office today more than 2 years status post mitral valve replacement using a bioprosthetic tissue valve, tricuspid valve repair, coronary artery bypass grafting, and Maze procedure on 04/25/2015 for rheumatic and degenerative mitral valve disease with severe symptomatic primary mitral regurgitation, tricuspid regurgitation, coronary artery disease, and recurrent persistent atrial fibrillation.  She was last seen here in our office on 09/13/2016. Since then she has done fairly well. She was seen in follow-up recently by Dr. Fletcher Anon.  She has stable symptoms of exertional shortness of breath and fatigue. She has not had any chest pain or chest tightness. She states that her activity level is basically back to how she was several years ago prior to surgery. She is somewhat unsteady on her feet, but this is not a new problem. She remains chronically anticoagulated using warfarin. She has not had any problems or complications with warfarin therapy. She has not had any palpitations or dizzy spells. She is scheduled for follow-up appointment in the near future with Dr. Caryl Comes.   Current Outpatient Prescriptions  Medication Sig Dispense Refill  . acetaminophen (TYLENOL) 325 MG tablet Take 2 tablets (650 mg total) by mouth every 4 (four) hours as needed for mild pain (or Fever >/= 101).    .  AMBULATORY NON FORMULARY MEDICATION Inogen at Home Oxygen machine with tubing. DX: COPD DX Code: J44.9 1 each 0  . aspirin EC 81 MG EC tablet Take 1 tablet (81 mg total) by mouth daily.    . benzonatate (TESSALON) 100 MG capsule Take 1 capsule (100 mg total) by mouth 3 (three) times daily as needed for cough. 20 capsule 0  . bisoprolol (ZEBETA) 10 MG tablet Take 1 tablet (10 mg total) by mouth daily. 30 tablet 5  . ferrous sulfate 325 (65 FE) MG tablet Take 325 mg by mouth daily with breakfast.    . fluticasone (FLONASE) 50 MCG/ACT nasal spray Place 1 spray into both nostrils daily as needed for allergies.     . furosemide (LASIX) 20 MG tablet Take 20 mg by mouth 2 (two) times daily.     Marland Kitchen gabapentin (NEURONTIN) 100 MG capsule Take 200 mg by mouth at bedtime.   1  . ipratropium-albuterol (DUONEB) 0.5-2.5 (3) MG/3ML SOLN Take 3 mLs by nebulization every 6 (six) hours as needed. 360 mL 1  . levothyroxine (SYNTHROID, LEVOTHROID) 88 MCG tablet Take 88 mcg by mouth daily before breakfast.    . losartan (COZAAR) 100 MG tablet Take 1 tablet (100 mg total) by mouth daily. 30 tablet 3  . Multiple Vitamins-Minerals (PRESERVISION AREDS 2 PO) Take 2 tablets by mouth daily.    . pantoprazole (PROTONIX) 40 MG tablet Take 40 mg by mouth 2 (two) times daily.     . potassium chloride SA (K-DUR,KLOR-CON) 20 MEQ tablet Take 20 mEq by mouth daily.  2  . simvastatin (ZOCOR) 20 MG  tablet Take 20 mg by mouth at bedtime.     Marland Kitchen spironolactone (ALDACTONE) 25 MG tablet Take 1 tablet (25 mg total) by mouth daily. 30 tablet 6  . warfarin (COUMADIN) 2.5 MG tablet TAKE AS DIRECTED BY COUMADIN CLINIC 55 tablet 3   No current facility-administered medications for this visit.       Physical Exam:   BP 140/76   Pulse 86   Resp 20   Ht 5\' 1"  (1.549 m)   Wt 137 lb (62.1 kg)   SpO2 94% Comment: RA  BMI 25.89 kg/m   General:  Elderly but well-appearing  Chest:   Clear to auscultation  CV:   Regular rate and rhythm  without murmur  Incisions:  n/a  Abdomen:  Soft and nontender  Extremities:  Warm and well-perfused, no lower extremity edema  Diagnostic Tests:  2 channel telemetry rhythm strip demonstrates atrial paced rhythm   Impression:  Patient remains clinically stable more than 2 years status post mitral valve replacement using a bioprosthetic tissue valve, tricuspid valve repair, coronary artery bypass grafting, and Maze procedure. She appears to be maintaining stable atrial paced rhythm. She is chronically anticoagulated using warfarin.  She describes stable symptoms of exertional shortness of breath consistent with chronic diastolic congestive heart failure, New [place] Heart Association functional class IIB  Plan:  We have not recommended any changes to the patient's current medications.  The patient has been reminded regarding the importance of dental hygiene and the lifelong need for antibiotic prophylaxis for all dental cleanings and other related invasive procedures.  In the future she will call or return to see [place] only should specific problems or questions arise.  I spent in excess of 15 minutes during the conduct of this office consultation and >50% of this time involved direct face-to-face encounter with the patient for counseling and/or coordination of their care.   Valentina Gu. Roxy Manns, MD 05/19/2017 2:51 PM

## 2017-05-28 ENCOUNTER — Ambulatory Visit (INDEPENDENT_AMBULATORY_CARE_PROVIDER_SITE_OTHER): Payer: Medicare Other

## 2017-05-28 DIAGNOSIS — Z953 Presence of xenogenic heart valve: Secondary | ICD-10-CM

## 2017-05-28 DIAGNOSIS — I340 Nonrheumatic mitral (valve) insufficiency: Secondary | ICD-10-CM

## 2017-05-28 DIAGNOSIS — Z5181 Encounter for therapeutic drug level monitoring: Secondary | ICD-10-CM

## 2017-05-28 LAB — POCT INR: INR: 3

## 2017-06-03 ENCOUNTER — Encounter: Payer: Self-pay | Admitting: Internal Medicine

## 2017-06-03 ENCOUNTER — Ambulatory Visit (INDEPENDENT_AMBULATORY_CARE_PROVIDER_SITE_OTHER): Payer: Medicare Other | Admitting: Internal Medicine

## 2017-06-03 VITALS — BP 120/60 | HR 77 | Ht 61.0 in | Wt 134.8 lb

## 2017-06-03 DIAGNOSIS — I495 Sick sinus syndrome: Secondary | ICD-10-CM | POA: Diagnosis not present

## 2017-06-03 DIAGNOSIS — I480 Paroxysmal atrial fibrillation: Secondary | ICD-10-CM

## 2017-06-03 DIAGNOSIS — Z950 Presence of cardiac pacemaker: Secondary | ICD-10-CM | POA: Diagnosis not present

## 2017-06-03 DIAGNOSIS — I442 Atrioventricular block, complete: Secondary | ICD-10-CM | POA: Diagnosis not present

## 2017-06-03 DIAGNOSIS — I2510 Atherosclerotic heart disease of native coronary artery without angina pectoris: Secondary | ICD-10-CM

## 2017-06-03 DIAGNOSIS — I059 Rheumatic mitral valve disease, unspecified: Secondary | ICD-10-CM

## 2017-06-03 NOTE — Patient Instructions (Signed)
Medication Instructions: - Your physician recommends that you continue on your current medications as directed. Please refer to the Current Medication list given to you today.  Labwork: - none ordered  Procedures/Testing: - none ordered  Follow-Up: - Remote monitoring is used to monitor your Pacemaker of ICD from home. This monitoring reduces the number of office visits required to check your device to one time per year. It allows [place] to keep an eye on the functioning of your device to ensure it is working properly. You are scheduled for a device check from home on 06/10/17. You may send your transmission at any time that day. If you have a wireless device, the transmission will be sent automatically. After your physician reviews your transmission, you will receive a postcard with your next transmission date.  - Your physician wants you to follow-up in: 1 year with Dr. Caryl Comes. You will receive a reminder letter in the mail two months in advance. If you don't receive a letter, please call our office to schedule the follow-up appointment.   Any Additional Special Instructions Will Be Listed Below (If Applicable).     If you need a refill on your cardiac medications before your next appointment, please call your pharmacy.

## 2017-06-03 NOTE — Progress Notes (Signed)
Patient Care Team: Ezequiel Kayser, MD as PCP - General (Internal Medicine)   HPI  [name] is a 82 y.o. female Seen in follow-up for a pacemaker implanted 8/16 in the context of multi-valvular surgery bioprosthetic mitral valve replacement, tricuspid valve repair and MAZE operation. She developed postoperative complete heart block [facility])  She has a history of atrial fibrillation she also developed a sternal wound infection requiring debridement.  Records and Results Reviewed  Including hospital records. Echocardiogram 5/16 demonstrated normal LV function.  Subsequent echocardiogram demonstrated an ejection fraction of 30-35% in the context of RV apical pacing. This was inactivated which she was seen in 1/17 with immediate improvement in symptoms.  Echocardiogram 1/18 demonstrated near normal LV function  Date Cr Hgb  8/17    9.2  5/18 0.8 11.5     She continues to complain of significant shortness of breath. There has been no edema. She has balance issues related to her cochlear implant.  She does not have peripheral edema or nocturnal dyspnea.    Past Medical History:  Diagnosis Date  . Anxiety   . Arthritis    "hands" (11/10/2015)  . Breast cancer ([place]) 2002   right breast  . CAD s/p CABG x 1    a. 04/2015 LIMA to LAD  . Cancer of right breast ([place]) 08/09/2001  . Chronic diastolic CHF (congestive heart failure) ([place])   . COPD (chronic obstructive pulmonary disease) ([place])   . COPD (chronic obstructive pulmonary disease) ([place])   . GERD (gastroesophageal reflux disease)   . Hyperlipidemia   . Hypertension   . Hypothyroidism   . 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 04/2015 Complete bilateral atrial lesion set using cryothermy and bipolar radiofrequency ablation with clipping of LA appendage  . Meniere disease   . On home oxygen therapy    "2L at night" (11/10/2015)  . PAF (paroxysmal atrial fibrillation) ([place])   . Pneumonia ~ 2010  . PONV (postoperative nausea  and vomiting)    Pt felt like eardrums were gonna pop  . Post-surgical complete heart block, symptomatic    a. 04/2015 s/p MDT [id number] Advisa DR MRI DC PPM.  . Presence of permanent cardiac pacemaker   . Pulmonary hypertension ([place])   . Severe Mitral Regurgitation s/p MV Repair    a. 04/2015 s/p 27 mm [facility] Mitral bovine bioprosthetic tissue valve  . Surgical wound, non healing - chest wall 11/10/2015  . Tricuspid Regurgitation s/p Repair    a. 04/2015 s/p 26 mm Edwards mc3 ring annuloplasty  . Wound infection 10/13/2015   Superficial sternal wound infection    Past Surgical History:  Procedure Laterality Date  . APPLICATION OF A-CELL OF CHEST/ABDOMEN N/A 11/21/2015   Procedure: APPLICATION OF A-CELL OF CHEST/ABDOMEN;  Surgeon: Loel Lofty Dillingham, DO;  Location: [place];  Service: Plastics;  Laterality: N/A;  . APPLICATION OF A-CELL OF CHEST/ABDOMEN Right 12/21/2015   Procedure: APPLICATION OF A-CELL OF RIGHT CHEST;  Surgeon: Loel Lofty Dillingham, DO;  Location: [place];  Service: Plastics;  Laterality: Right;  . APPLICATION OF A-CELL OF CHEST/ABDOMEN Right 01/11/2016   Procedure: APPLICATION OF A-CELL OF RIGHT CHEST;  Surgeon: Loel Lofty Dillingham, DO;  Location: [place];  Service: Plastics;  Laterality: Right;  . APPLICATION OF A-CELL OF CHEST/ABDOMEN Right 02/12/2016   Procedure: APPLICATION OF A-CELL TO RIGHT CHEST WOUND;  Surgeon: Loel Lofty Dillingham, DO;  Location: [place];  Service: Plastics;  Laterality: Right;  . APPLICATION OF WOUND VAC N/A 05/09/2015   Procedure: APPLICATION OF WOUND VAC;  Surgeon: Rexene Alberts, MD;  Location: [place];  Service: Thoracic;  Laterality: N/A;  . APPLICATION OF WOUND VAC N/A 11/13/2015   Procedure: APPLICATION OF WOUND VAC;  Surgeon: Rexene Alberts, MD;  Location: [place];  Service: Thoracic;  Laterality: N/A;  . APPLICATION OF WOUND VAC N/A 11/21/2015   Procedure: APPLICATION OF WOUND VAC;  Surgeon: Loel Lofty Dillingham, DO;  Location: [place];  Service: Plastics;   Laterality: N/A;  . APPLICATION OF WOUND VAC Right 12/21/2015   Procedure: APPLICATION OF WOUND VAC to right chest wall ;  Surgeon: Loel Lofty Dillingham, DO;  Location: [place];  Service: Plastics;  Laterality: Right;  . APPLICATION OF WOUND VAC Right 01/11/2016   Procedure: APPLICATION OF WOUND VAC RIGHT CHEST ;  Surgeon: Loel Lofty Dillingham, DO;  Location: [place];  Service: Plastics;  Laterality: Right;  . APPLICATION OF WOUND VAC Right 01/24/2016   Procedure: APPLICATION OF WOUND VAC RIGHT CHEST WALL;  Surgeon: Loel Lofty Dillingham, DO;  Location: [place];  Service: Plastics;  Laterality: Right;  . APPLICATION OF WOUND VAC Right 02/12/2016   Procedure: RE-APPLICATION OF WOUND VAC TO RIGHT CHEST WOUND;  Surgeon: Loel Lofty Dillingham, DO;  Location: [place];  Service: Plastics;  Laterality: Right;  . BREAST BIOPSY Left 11/25/06   neg  . BREAST BIOPSY Left 01/20/12   /clip-neg  . CARDIAC CATHETERIZATION  11/2013   [facility]  . CARDIAC CATHETERIZATION  10/2014   [facility]  . CARDIAC VALVE REPLACEMENT    . CATARACT EXTRACTION W/ INTRAOCULAR LENS  IMPLANT, BILATERAL Bilateral 2014  . CLIPPING OF ATRIAL APPENDAGE N/A 04/25/2015   Procedure: CLIPPING OF ATRIAL APPENDAGE;  Surgeon: Rexene Alberts, MD;  Location: [place];  Service: Open Heart Surgery;  Laterality: N/A;  . COCHLEAR IMPLANT Left 2005?  . CORONARY ARTERY BYPASS GRAFT N/A 04/25/2015   Procedure: CORONARY ARTERY BYPASS GRAFTING (CABG) x ONE, using left internal mammary artery;  Surgeon: Rexene Alberts, MD;  Location: [place];  Service: Open Heart Surgery;  Laterality: N/A;  . DILATION AND CURETTAGE OF UTERUS  "several before hysterectomy"  . EP IMPLANTABLE DEVICE N/A 05/02/2015   Procedure: Pacemaker Implant;  Surgeon: Thompson Grayer, MD;  Location: [place] CV LAB;  Service: Cardiovascular;  Laterality: N/A;  . I&D EXTREMITY Right 12/06/2015   Procedure: IRRIGATION AND DEBRIDEMENT RIGHT CHEST WALL WITH ACELL PLACEMENT AND VAC;  Surgeon: Loel Lofty Dillingham, DO;   Location: [place];  Service: Plastics;  Laterality: Right;  . INCISION AND DRAINAGE OF WOUND N/A 11/21/2015   Procedure: IRRIGATION AND DEBRIDEMENT WOUND;  Surgeon: Loel Lofty Dillingham, DO;  Location: [place];  Service: Plastics;  Laterality: N/A;  . INCISION AND DRAINAGE OF WOUND Right 12/21/2015   Procedure: IRRIGATION AND DEBRIDEMENT right chest wall WOUND;  Surgeon: Loel Lofty Dillingham, DO;  Location: [place];  Service: Plastics;  Laterality: Right;  right chest wall   . INCISION AND DRAINAGE OF WOUND Right 01/24/2016   Procedure: IRRIGATION AND DEBRIDEMENT RIGHT CHEST WALL WOUND;  Surgeon: Loel Lofty Dillingham, DO;  Location: [place];  Service: Plastics;  Laterality: Right;  . INCISION AND DRAINAGE OF WOUND Right 03/28/2016   Procedure: IRRIGATION AND DEBRIDEMENT RIGHT CHEST WALL WOUND WITH A Cell Placement;  Surgeon: Loel Lofty Dillingham, DO;  Location: [place];  Service: Plastics;  Laterality: Right;  . INCISION AND DRAINAGE OF WOUND  Right 05/17/2016   Procedure: IRRIGATION AND DEBRIDEMENT OF RIGHT CHEST WOUND WITH A CELL PLACEMENT;  Surgeon: Loel Lofty Dillingham, DO;  Location: [place];  Service: Plastics;  Laterality: Right;  . INSERT / REPLACE / REMOVE PACEMAKER    . IRRIGATION AND DEBRIDEMENT OF WOUND WITH SPLIT THICKNESS SKIN GRAFT Right 01/11/2016   Procedure: IRRIGATION AND DEBRIDEMENT OF RIGHT CHEST WOUND ;  Surgeon: Loel Lofty Dillingham, DO;  Location: [place];  Service: Plastics;  Laterality: Right;  . MASTECTOMY, PARTIAL Right 2002   positive  . MAZE N/A 04/25/2015   Procedure: MAZE;  Surgeon: Rexene Alberts, MD;  Location: [place];  Service: Open Heart Surgery;  Laterality: N/A;  . MITRAL VALVE REPAIR N/A 04/25/2015   Procedure: MITRAL VALVE  REPLACEMENT using a 27 mm Edwards Perimount Magna Mitral Ease Valve;  Surgeon: Rexene Alberts, MD;  Location: [place];  Service: Open Heart Surgery;  Laterality: N/A;  . SKIN SPLIT GRAFT Right 02/12/2016   Procedure: IRRIGATION AND DEBRIDEMENT RIGHT CHEST WOUND;   Surgeon: Loel Lofty Dillingham, DO;  Location: [place];  Service: Plastics;  Laterality: Right;  . STERNAL WIRES REMOVAL N/A 06/05/2015   Procedure: STERNAL WIRES REMOVAL;  Surgeon: Rexene Alberts, MD;  Location: [place];  Service: Thoracic;  Laterality: N/A;  . STERNAL WOUND DEBRIDEMENT N/A 05/09/2015   Procedure: STERNAL WOUND DEBRIDEMENT;  Surgeon: Rexene Alberts, MD;  Location: [place];  Service: Thoracic;  Laterality: N/A;  . STERNAL WOUND DEBRIDEMENT N/A 11/13/2015   Procedure: Excisional drainage of RIGHT Chest wall mass and breast mass ;  Surgeon: Rexene Alberts, MD;  Location: [place];  Service: Thoracic;  Laterality: N/A;  . TEE WITHOUT CARDIOVERSION N/A 04/25/2015   Procedure: TRANSESOPHAGEAL ECHOCARDIOGRAM (TEE);  Surgeon: Rexene Alberts, MD;  Location: [place];  Service: Open Heart Surgery;  Laterality: N/A;  . TONSILLECTOMY    . TRAM Right 11/18/2015   Procedure: VRAM Vertical Rectus Abdominus Muscle Flap;  Surgeon: Loel Lofty Dillingham, DO;  Location: [place];  Service: Plastics;  Laterality: Right;  RIght Back  . TRICUSPID VALVE REPLACEMENT N/A 04/25/2015   Procedure: TRICUSPID VALVE REPAIR;  Surgeon: Rexene Alberts, MD;  Location: [place];  Service: Open Heart Surgery;  Laterality: N/A;  . VAGINAL HYSTERECTOMY      Current Outpatient Prescriptions  Medication Sig Dispense Refill  . acetaminophen (TYLENOL) 325 MG tablet Take 2 tablets (650 mg total) by mouth every 4 (four) hours as needed for mild pain (or Fever >/= 101).    . AMBULATORY NON FORMULARY MEDICATION Inogen at Home Oxygen machine with tubing. DX: COPD DX Code: J44.9 1 each 0  . aspirin EC 81 MG EC tablet Take 1 tablet (81 mg total) by mouth daily.    . benzonatate (TESSALON) 100 MG capsule Take 1 capsule (100 mg total) by mouth 3 (three) times daily as needed for cough. 20 capsule 0  . bisoprolol (ZEBETA) 10 MG tablet Take 1 tablet (10 mg total) by mouth daily. 30 tablet 5  . ferrous sulfate 325 (65 FE) MG tablet Take 325 mg by mouth  daily with breakfast.    . fluticasone (FLONASE) 50 MCG/ACT nasal spray Place 1 spray into both nostrils daily as needed for allergies.     . furosemide (LASIX) 20 MG tablet Take 20 mg by mouth 2 (two) times daily.     Marland Kitchen gabapentin (NEURONTIN) 100 MG capsule Take 200 mg by mouth at bedtime.   1  .  ipratropium-albuterol (DUONEB) 0.5-2.5 (3) MG/3ML SOLN Take 3 mLs by nebulization every 6 (six) hours as needed. 360 mL 1  . levothyroxine (SYNTHROID, LEVOTHROID) 88 MCG tablet Take 88 mcg by mouth daily before breakfast.    . losartan (COZAAR) 100 MG tablet Take 1 tablet (100 mg total) by mouth daily. 30 tablet 3  . Multiple Vitamins-Minerals (PRESERVISION AREDS 2 PO) Take 2 tablets by mouth daily.    . pantoprazole (PROTONIX) 40 MG tablet Take 40 mg by mouth 2 (two) times daily.     . potassium chloride SA (K-DUR,KLOR-CON) 20 MEQ tablet Take 20 mEq by mouth daily.  2  . simvastatin (ZOCOR) 20 MG tablet Take 20 mg by mouth at bedtime.     Marland Kitchen spironolactone (ALDACTONE) 25 MG tablet Take 1 tablet (25 mg total) by mouth daily. 30 tablet 6  . warfarin (COUMADIN) 2.5 MG tablet TAKE AS DIRECTED BY COUMADIN CLINIC 55 tablet 3   No current facility-administered medications for this visit.     Allergies  Allergen Reactions  . Ace Inhibitors Cough  . Augmentin [Amoxicillin-Pot Clavulanate] Swelling    JOINTS, PAIN  . Penicillins Swelling    SWOLLEN JOINTS  Has patient had a PCN reaction causing immediate rash, facial/tongue/throat swelling, SOB or lightheadedness with hypotension: Yes Has patient had a PCN reaction causing severe rash involving mucus membranes or skin necrosis: No Has patient had a PCN reaction that required hospitalization No Has patient had a PCN reaction occurring within the last 10 years: No If all of the above answers are "NO", then may proceed with Cephalosporin use.   . Nifedipine Other (See Comments)    UNSPECIFIED  . Propranolol Other (See Comments)    UNSPECIFIED  .  Diazepam Other (See Comments)    Made her "hyper"  . Meperidine Other (See Comments)    Made her "hyper"  . Propoxyphene Other (See Comments)    Made her "hyper"      Review of Systems negative except from HPI and PMH  Physical Exam BP 120/60 (BP Location: Left Arm, Patient Position: Sitting, Cuff Size: Normal)   Pulse 77   Ht 5\' 1"  (1.549 m)   Wt 134 lb 12 oz (61.1 kg)   BMI 25.46 kg/m  Well developed and nourished in no acute distress HENT normal Neck supple with JVP-flat Carotids brisk and full without bruits Clear Regular rate and rhythm, 2/6 systolic m Abd-soft with active BS without hepatomegaly No Clubbing cyanosis edema Skin-warm and dry A & Oriented  Grossly normal sensory and motor function  ECG demonstrates atrial pacing at a rate of 77 35/11/40 [id number]   Assessment and  Plan  Atrial fibrillation-paroxysmal status post maze operation  Complete heart block -Intermittent  Sinus node dysfunction   First-degree AV block  Status post mitral valve replacement-bioprosthetic with tricuspid valve repair  Pacemaker-Medtronic  Dypsnea on Exertion   On Anticoagulation;  No bleeding issues   No intercurrent atrial fibrillation or flutter  I reprogrammed her ADLs weight 100-to-85 decreased to smoke as her heart rate excursion was markedly right shifted. I have her walk down the hall and she was able to walk and talk at the same time. She was quite encouraged. Another option for programming would be to shorten her AV delay  We will plan to see her in one year's time she will let us know next weeks how she's feeling.  She'll need follow-up of her hemoglobin.

## 2017-06-10 ENCOUNTER — Ambulatory Visit (INDEPENDENT_AMBULATORY_CARE_PROVIDER_SITE_OTHER): Payer: Medicare Other | Admitting: *Deleted

## 2017-06-10 ENCOUNTER — Telehealth: Payer: Self-pay | Admitting: Cardiology

## 2017-06-10 DIAGNOSIS — I442 Atrioventricular block, complete: Secondary | ICD-10-CM

## 2017-06-10 NOTE — Telephone Encounter (Signed)
Attempted to confirm remote transmission with pt. No answer and was unable to leave a message.   

## 2017-06-11 ENCOUNTER — Encounter: Payer: Self-pay | Admitting: Cardiology

## 2017-06-11 NOTE — Progress Notes (Signed)
Remote pacemaker transmission.   

## 2017-06-12 LAB — CUP PACEART REMOTE DEVICE CHECK
Battery Remaining Longevity: 95 mo
Brady Statistic AP VP Percent: 0.06 %
Brady Statistic AP VS Percent: 92.81 %
Brady Statistic AS VS Percent: 7.12 %
Brady Statistic RV Percent Paced: 0.08 %
Implantable Lead Implant Date: 20160823
Implantable Lead Location: 753859
Implantable Lead Model: 5076
Lead Channel Impedance Value: 456 Ohm
Lead Channel Impedance Value: 456 Ohm
Lead Channel Pacing Threshold Amplitude: 0.5 V
Lead Channel Sensing Intrinsic Amplitude: 11 mV
Lead Channel Sensing Intrinsic Amplitude: 2.25 mV
Lead Channel Setting Pacing Amplitude: 2.5 V
Lead Channel Setting Sensing Sensitivity: 2.8 mV
MDC IDC LEAD IMPLANT DT: 20160823
MDC IDC LEAD LOCATION: 753860
MDC IDC MSMT BATTERY VOLTAGE: 3.02 V
MDC IDC MSMT LEADCHNL RA IMPEDANCE VALUE: 380 Ohm
MDC IDC MSMT LEADCHNL RA PACING THRESHOLD AMPLITUDE: 0.5 V
MDC IDC MSMT LEADCHNL RA PACING THRESHOLD PULSEWIDTH: 0.4 ms
MDC IDC MSMT LEADCHNL RA SENSING INTR AMPL: 2.25 mV
MDC IDC MSMT LEADCHNL RV IMPEDANCE VALUE: 437 Ohm
MDC IDC MSMT LEADCHNL RV PACING THRESHOLD PULSEWIDTH: 0.4 ms
MDC IDC MSMT LEADCHNL RV SENSING INTR AMPL: 11 mV
MDC IDC PG IMPLANT DT: 20160823
MDC IDC SESS DTM: 20181003190637
MDC IDC SET LEADCHNL RA PACING AMPLITUDE: 2 V
MDC IDC SET LEADCHNL RV PACING PULSEWIDTH: 0.4 ms
MDC IDC STAT BRADY AS VP PERCENT: 0 %
MDC IDC STAT BRADY RA PERCENT PACED: 92.35 %

## 2017-06-13 LAB — CUP PACEART INCLINIC DEVICE CHECK
Battery Remaining Longevity: 95 mo
Battery Voltage: 3.02 V
Brady Statistic AP VP Percent: 0.21 %
Brady Statistic AS VP Percent: 0.01 %
Brady Statistic RV Percent Paced: 0.28 %
Implantable Lead Implant Date: 20160823
Implantable Lead Implant Date: 20160823
Implantable Lead Location: 753860
Implantable Lead Model: 5076
Implantable Lead Model: 5076
Implantable Pulse Generator Implant Date: 20160823
Lead Channel Impedance Value: 380 Ohm
Lead Channel Impedance Value: 456 Ohm
Lead Channel Impedance Value: 456 Ohm
Lead Channel Impedance Value: 475 Ohm
Lead Channel Pacing Threshold Amplitude: 0.5 V
Lead Channel Pacing Threshold Pulse Width: 0.4 ms
Lead Channel Setting Pacing Amplitude: 2 V
Lead Channel Setting Sensing Sensitivity: 2.8 mV
MDC IDC LEAD LOCATION: 753859
MDC IDC MSMT LEADCHNL RA PACING THRESHOLD AMPLITUDE: 0.5 V
MDC IDC MSMT LEADCHNL RV PACING THRESHOLD PULSEWIDTH: 0.4 ms
MDC IDC MSMT LEADCHNL RV SENSING INTR AMPL: 12.875 mV
MDC IDC SESS DTM: 20180925160926
MDC IDC SET LEADCHNL RV PACING AMPLITUDE: 2.5 V
MDC IDC SET LEADCHNL RV PACING PULSEWIDTH: 0.4 ms
MDC IDC STAT BRADY AP VS PERCENT: 95.84 %
MDC IDC STAT BRADY AS VS PERCENT: 3.94 %
MDC IDC STAT BRADY RA PERCENT PACED: 94.72 %

## 2017-06-18 ENCOUNTER — Ambulatory Visit (INDEPENDENT_AMBULATORY_CARE_PROVIDER_SITE_OTHER): Payer: Medicare Other

## 2017-06-18 DIAGNOSIS — Z953 Presence of xenogenic heart valve: Secondary | ICD-10-CM

## 2017-06-18 DIAGNOSIS — Z5181 Encounter for therapeutic drug level monitoring: Secondary | ICD-10-CM | POA: Diagnosis not present

## 2017-06-18 DIAGNOSIS — I340 Nonrheumatic mitral (valve) insufficiency: Secondary | ICD-10-CM | POA: Diagnosis not present

## 2017-06-18 LAB — POCT INR: INR: 2.7

## 2017-07-14 ENCOUNTER — Other Ambulatory Visit: Payer: Self-pay | Admitting: Cardiovascular Disease

## 2017-07-14 NOTE — Telephone Encounter (Signed)
Please review for refill, thanks ! 

## 2017-07-23 ENCOUNTER — Ambulatory Visit (INDEPENDENT_AMBULATORY_CARE_PROVIDER_SITE_OTHER): Payer: Medicare Other

## 2017-07-23 DIAGNOSIS — I340 Nonrheumatic mitral (valve) insufficiency: Secondary | ICD-10-CM | POA: Diagnosis not present

## 2017-07-23 DIAGNOSIS — Z953 Presence of xenogenic heart valve: Secondary | ICD-10-CM | POA: Diagnosis not present

## 2017-07-23 DIAGNOSIS — Z5181 Encounter for therapeutic drug level monitoring: Secondary | ICD-10-CM

## 2017-07-23 LAB — POCT INR: INR: 2.9

## 2017-08-05 IMAGING — CR DG CHEST 1V PORT
1 series · 1 of 1 positions shown · non-contrast
Comparison: CT chest 11/10/2015 and chest radiograph 05/22/2015.

CLINICAL DATA: Incisional drainage of right chest wall mass.

EXAM:
PORTABLE CHEST 1 VIEW

[AP]
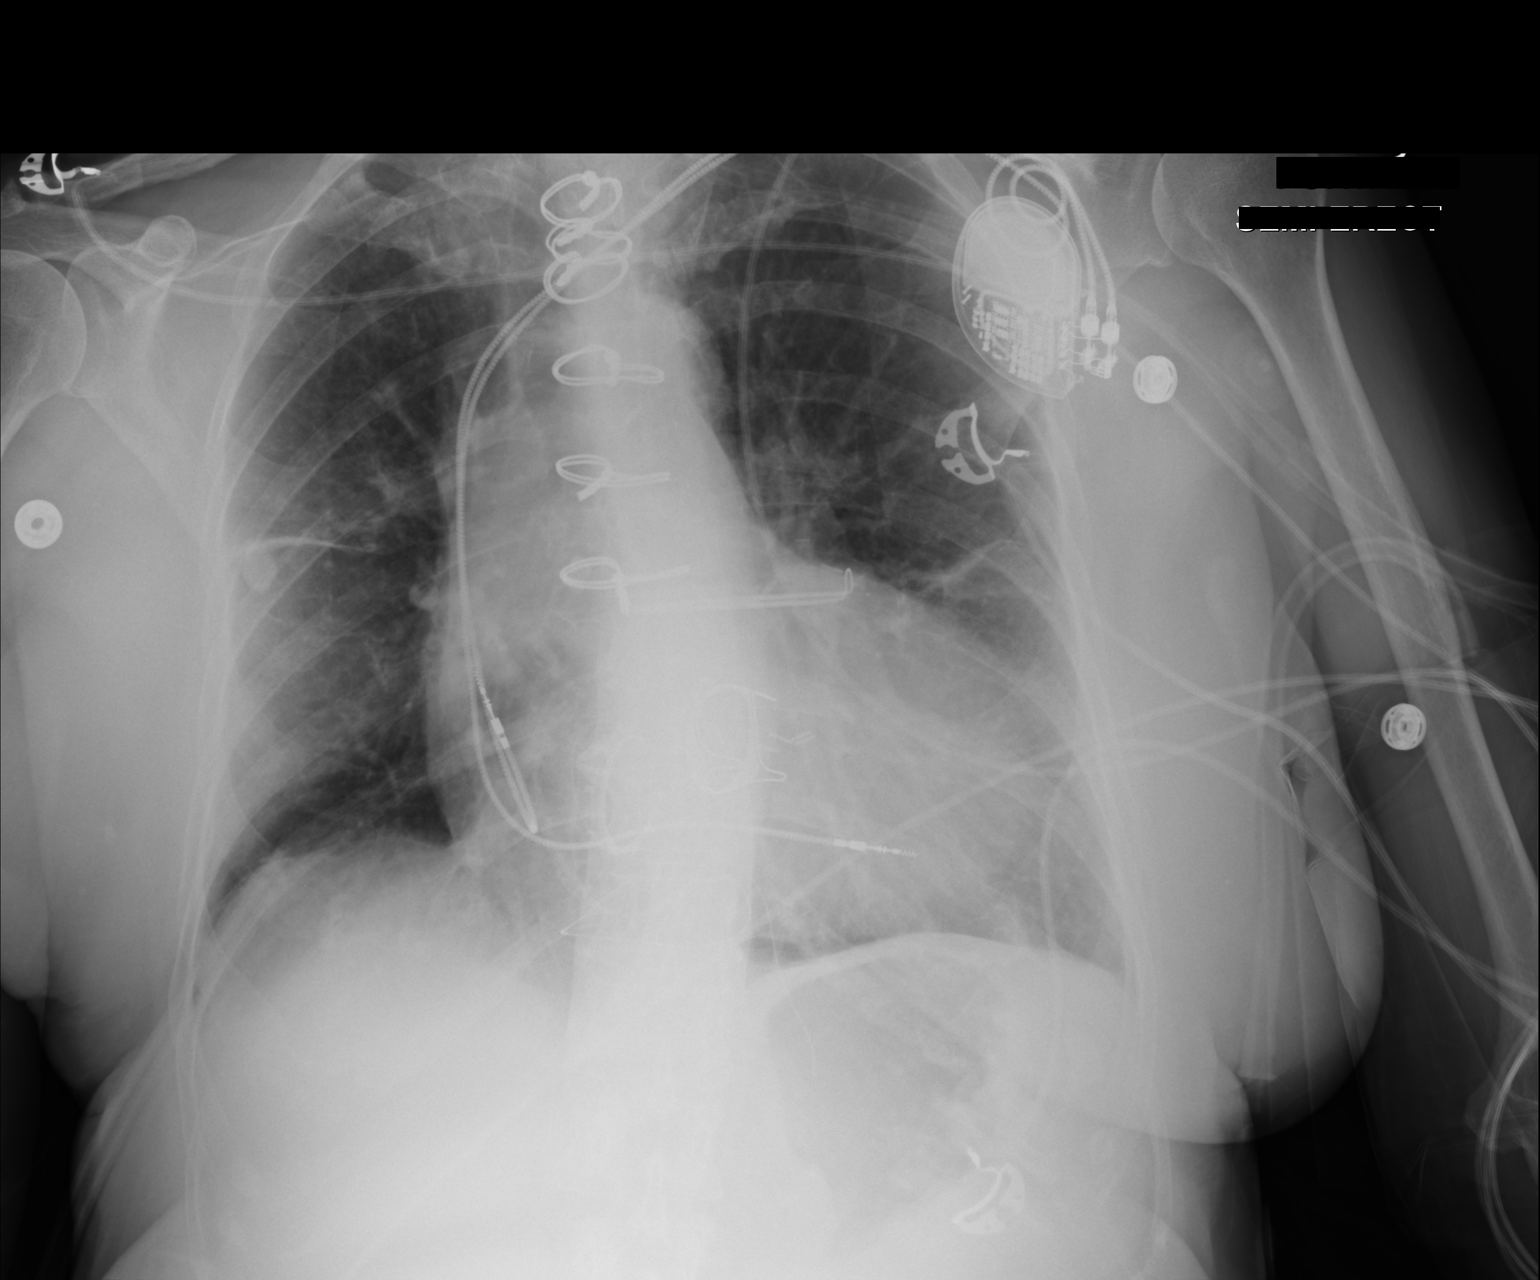

[1 of 1 positions shown; findings below may reference images not displayed]

FINDINGS: Trachea is midline. Heart is enlarged. Pacemaker lead tips are
stable in position, in the right atrium and right ventricle. Left
atrial appendage clip is in place with evidence of mitral and
tricuspid valve repair. Linear scarring or atelectasis in the
midlung zones bilaterally. Calcified granuloma in the lateral right
midlung zone. Lungs are otherwise clear. No pleural fluid. No
pneumothorax.
IMPRESSION: No acute findings.

## 2017-08-06 IMAGING — CR DG CHEST 1V PORT
1 series · 1 of 1 positions shown · non-contrast
Comparison: 11/13/2015

CLINICAL DATA: Evaluate placement of right PICC

EXAM:
PORTABLE CHEST 1 VIEW

[AP]
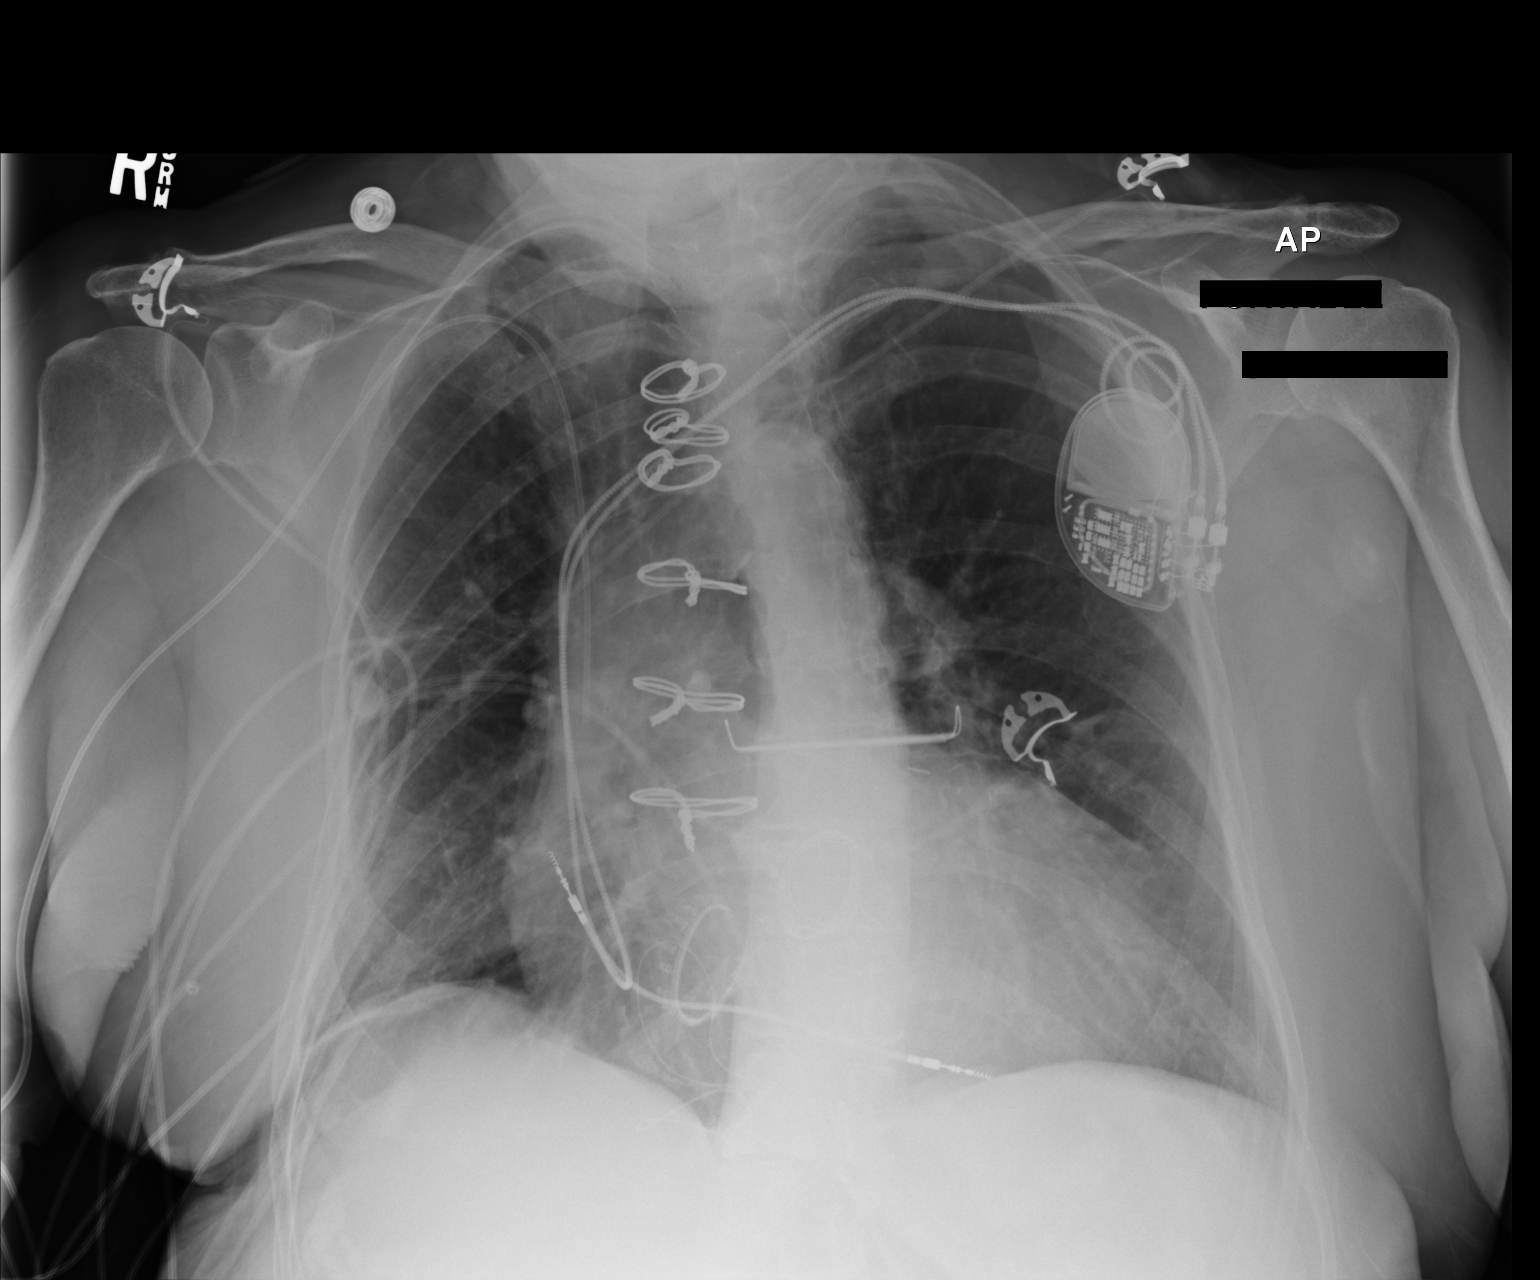

[1 of 1 positions shown; findings below may reference images not displayed]

FINDINGS: Tip of the new right-sided PICC projects in the lower superior vena
cava.

Cardiac silhouette is enlarged. Changes from cardiac surgery and
valve replacements is stable. No mediastinal or hilar masses or
adenopathy.

Lungs are essentially clear.  No pleural effusion.  No pneumothorax.
IMPRESSION: 1. Right PICC is well positioned with its tip in the lower superior
vena cava.
2. No other change from prior study. No acute cardiopulmonary
disease.

## 2017-09-03 ENCOUNTER — Ambulatory Visit (INDEPENDENT_AMBULATORY_CARE_PROVIDER_SITE_OTHER): Payer: Medicare Other

## 2017-09-03 DIAGNOSIS — Z5181 Encounter for therapeutic drug level monitoring: Secondary | ICD-10-CM

## 2017-09-03 DIAGNOSIS — I340 Nonrheumatic mitral (valve) insufficiency: Secondary | ICD-10-CM | POA: Diagnosis not present

## 2017-09-03 DIAGNOSIS — Z953 Presence of xenogenic heart valve: Secondary | ICD-10-CM | POA: Diagnosis not present

## 2017-09-03 LAB — POCT INR: INR: 4.5

## 2017-09-03 NOTE — Patient Instructions (Signed)
Please skip coumadin tonight and tomorrow night.  Then resume taking 1.5 tablets daily except 2 tablets on Mondays.   Recheck INR in 3 weeks.

## 2017-09-10 ENCOUNTER — Ambulatory Visit (INDEPENDENT_AMBULATORY_CARE_PROVIDER_SITE_OTHER): Payer: Medicare Other | Admitting: *Deleted

## 2017-09-10 DIAGNOSIS — I495 Sick sinus syndrome: Secondary | ICD-10-CM

## 2017-09-11 NOTE — Progress Notes (Signed)
Remote pacemaker transmission.   

## 2017-09-12 ENCOUNTER — Encounter: Payer: Self-pay | Admitting: Cardiology

## 2017-09-18 LAB — CUP PACEART REMOTE DEVICE CHECK
Battery Remaining Longevity: 94 mo
Brady Statistic AP VP Percent: 0.18 %
Brady Statistic AP VS Percent: 92.9 %
Brady Statistic AS VP Percent: 0.01 %
Brady Statistic RA Percent Paced: 91.47 %
Date Time Interrogation Session: 20190102175410
Implantable Lead Implant Date: 20160823
Implantable Lead Location: 753860
Implantable Lead Model: 5076
Implantable Lead Model: 5076
Lead Channel Impedance Value: 361 Ohm
Lead Channel Impedance Value: 418 Ohm
Lead Channel Pacing Threshold Pulse Width: 0.4 ms
Lead Channel Pacing Threshold Pulse Width: 0.4 ms
Lead Channel Sensing Intrinsic Amplitude: 1.875 mV
Lead Channel Sensing Intrinsic Amplitude: 1.875 mV
Lead Channel Sensing Intrinsic Amplitude: 10.25 mV
Lead Channel Setting Pacing Pulse Width: 0.4 ms
MDC IDC LEAD IMPLANT DT: 20160823
MDC IDC LEAD LOCATION: 753859
MDC IDC MSMT BATTERY VOLTAGE: 3.02 V
MDC IDC MSMT LEADCHNL RA IMPEDANCE VALUE: 437 Ohm
MDC IDC MSMT LEADCHNL RA PACING THRESHOLD AMPLITUDE: 0.5 V
MDC IDC MSMT LEADCHNL RV IMPEDANCE VALUE: 437 Ohm
MDC IDC MSMT LEADCHNL RV PACING THRESHOLD AMPLITUDE: 0.5 V
MDC IDC MSMT LEADCHNL RV SENSING INTR AMPL: 10.25 mV
MDC IDC PG IMPLANT DT: 20160823
MDC IDC SET LEADCHNL RA PACING AMPLITUDE: 2 V
MDC IDC SET LEADCHNL RV PACING AMPLITUDE: 2.5 V
MDC IDC SET LEADCHNL RV SENSING SENSITIVITY: 2.8 mV
MDC IDC STAT BRADY AS VS PERCENT: 6.91 %
MDC IDC STAT BRADY RV PERCENT PACED: 0.25 %

## 2017-09-24 ENCOUNTER — Ambulatory Visit (INDEPENDENT_AMBULATORY_CARE_PROVIDER_SITE_OTHER): Payer: Medicare Other

## 2017-09-24 DIAGNOSIS — I340 Nonrheumatic mitral (valve) insufficiency: Secondary | ICD-10-CM

## 2017-09-24 DIAGNOSIS — Z5181 Encounter for therapeutic drug level monitoring: Secondary | ICD-10-CM | POA: Diagnosis not present

## 2017-09-24 DIAGNOSIS — Z953 Presence of xenogenic heart valve: Secondary | ICD-10-CM

## 2017-09-24 LAB — POCT INR: INR: 3.3

## 2017-09-24 NOTE — Patient Instructions (Signed)
Please skip coumadin tonight, then start new dosage of 1.5 tablets every day.  Recheck INR in 3 weeks.

## 2017-10-06 ENCOUNTER — Ambulatory Visit
Admission: RE | Admit: 2017-10-06 | Discharge: 2017-10-06 | Disposition: A | Discharge status: Home / Self Care | Payer: Medicare Other | Source: Ambulatory Visit | Attending: Internal Medicine | Admitting: Internal Medicine

## 2017-10-06 ENCOUNTER — Other Ambulatory Visit
Admission: RE | Admit: 2017-10-06 | Discharge: 2017-10-06 | Disposition: A | Discharge status: Home / Self Care | Payer: Medicare Other | Source: Ambulatory Visit | Attending: Internal Medicine | Admitting: Internal Medicine

## 2017-10-06 ENCOUNTER — Encounter: Payer: Self-pay | Admitting: Internal Medicine

## 2017-10-06 ENCOUNTER — Ambulatory Visit (INDEPENDENT_AMBULATORY_CARE_PROVIDER_SITE_OTHER): Payer: Medicare Other | Admitting: Internal Medicine

## 2017-10-06 ENCOUNTER — Other Ambulatory Visit: Payer: Self-pay | Admitting: *Deleted

## 2017-10-06 ENCOUNTER — Telehealth: Payer: Self-pay | Admitting: Internal Medicine

## 2017-10-06 VITALS — BP 138/80 | HR 68 | Resp 16 | Ht 61.0 in | Wt 133.0 lb

## 2017-10-06 DIAGNOSIS — J441 Chronic obstructive pulmonary disease with (acute) exacerbation: Secondary | ICD-10-CM

## 2017-10-06 DIAGNOSIS — I700 Atherosclerosis of aorta: Secondary | ICD-10-CM | POA: Insufficient documentation

## 2017-10-06 DIAGNOSIS — R918 Other nonspecific abnormal finding of lung field: Secondary | ICD-10-CM | POA: Diagnosis not present

## 2017-10-06 DIAGNOSIS — J449 Chronic obstructive pulmonary disease, unspecified: Secondary | ICD-10-CM

## 2017-10-06 LAB — INFLUENZA PANEL BY PCR (TYPE A & B)
Influenza A By PCR: NEGATIVE
Influenza B By PCR: NEGATIVE

## 2017-10-06 MED ORDER — PREDNISONE 20 MG PO TABS
40.0000 mg | ORAL_TABLET | Freq: Every day | ORAL | 0 refills | Status: DC
Start: 2017-10-06 — End: 2017-10-06

## 2017-10-06 MED ORDER — GUAIFENESIN-CODEINE 100-10 MG/5ML PO SOLN: 5.0000 mL | ORAL | 0 refills | Status: DC | PRN

## 2017-10-06 MED ORDER — PREDNISONE 20 MG PO TABS: 40.0000 mg | ORAL_TABLET | Freq: Every day | ORAL | 0 refills | Status: DC

## 2017-10-06 MED ORDER — AZITHROMYCIN 250 MG PO TABS: ORAL_TABLET | ORAL | 0 refills | Status: DC

## 2017-10-06 MED ORDER — IPRATROPIUM-ALBUTEROL 0.5-2.5 (3) MG/3ML IN SOLN: 3.0000 mL | Freq: Four times a day (QID) | RESPIRATORY_TRACT | 1 refills | Status: DC | PRN

## 2017-10-06 NOTE — Addendum Note (Signed)
Addended by: Stephanie Coup on: 10/06/2017 10:43 AM   Modules accepted: Orders

## 2017-10-06 NOTE — Telephone Encounter (Signed)
Rx printed, signed and faxed.

## 2017-10-06 NOTE — Progress Notes (Signed)
PULMONARY OFFICE FOLLOW UP NOTE  Date of initial consultation: 07/27/15 Reason for consultation: COPD  Initial HPI:  83WF  Dx of  COPD. Last seen 02/2016 by Dr Alva Garnet Previously followed by Dr Raul Del. She was first diagnosed with COPD approx 7 yrs ago at the time of a pneumonia (was not hospitalized). She has been treated with bronchodilator therapy.  She has been on oxygen therapy since 12/15. Notably, at that time, she was suffering from CHF EF 30% S/p Maze MVR and single vessel CABG-complicated by a wound infection.  previous cardiac rehab program-limited by fatigue and deconditioning more than by dyspnea. She walks slowly on a treadmill for 20 mins @ cardiac rehab. She can complete a flight of stairs.  She has been tried on [facility] which caused hoarseness. She is up to date on flu and pneumococcal vaccines.  Initial Impression (07/26/25): Chronic obstructive pulmonary disease, unspecified COPD type. Hypoxemia - resolved. Might have been due to CHF. Initial Data: 05/22/15 CXR: Hyperinflation. 03/17/15 PFTs: mild-mod obstruction, normal lung volumes, severe decrease DLCO.   Initial Plan (07/27/15): Trial of Incruse - once inhalation daily. Change O2 to nocturnal and PRN during day.   Subsequent data: CXR 08/17/15: No acute chest findings. Chronic postoperative changes as described. TTE 08/22/15: LVEF 30-35%. Diffuse HK. prosthetic valves functioning well ONO on RA 02/22/16: significant desaturation to low of 78%. Total desaturation events: 170   CC  Acute visit +chest congestion and cough  SUBJ: +chest congestion and cough for several weeks Hypoxia with exertion o2 sats dropped Does not feel well +SOB and increased WOB   BP 138/80 (BP Location: Left Arm, Cuff Size: Normal)   Pulse 68   Resp 16   Ht 5\' 1"  (1.549 m)   Wt 133 lb (60.3 kg)   SpO2 90%   BMI 25.13 kg/m    Review of Systems:  Gen:  +hills weigh loss , +fatigue HEENT: Denies blurred vision, double vision, ear  pain, eye pain, hearing loss, nose bleeds, sore throat Cardiac:  No dizziness, chest pain or heaviness, chest tightness,edema Resp:   +cough +shortness of breath,+wheezing, -hemoptysis,  Gi: Denies swallowing difficulty, stomach pain, nausea or vomiting, diarrhea, constipation, bowel incontinence Other:  All other systems negative   Vitals:   10/06/17 1004  Height: 5\' 1"  (1.549 m)   EXAM:  Gen: chest congestion, +ill appearing HEENT: All WNL Neck: NO LAN, no JVD noted Lungs: slightly diminshed BS, no wheezes or rales Cardiovascular: Reg, no M noted Abdomen: Soft, NT +BS Ext: no C/C/E Neuro: CNs intact, motor/sens grossly intact   IMPRESSION: 83 yo with multiple medical issues now with acute COPD exacerbation with acute bronchitis hypoxic respiratory failure  1.patient will need oxygen with exertion 2. Check CXR Pa and LAT 3.check for INLFU 4.dounebs avery 4 hrs 5.prednisone 40 mg daily for 10 days 6.Z pak 7.Gauf with codiene   Patient/Family are satisfied with Plan of action and management. All questions answered  Corrin Parker, M.D.  [facility]  Medical Director [place] Director [facility] Cardio-Pulmonary Department

## 2017-10-06 NOTE — Telephone Encounter (Signed)
Pt calling stating [facility] went to [facility] [place] and they didn't have prescriptions for her cough  They told her for that she would need a signed prescription   Please advise

## 2017-10-06 NOTE — Patient Instructions (Addendum)
Prednisone 40 mg daily for 10 days Z pak Oxygen with exertion and at night DOUNEBS  every 4 hrs as needed

## 2017-10-07 NOTE — Telephone Encounter (Signed)
Called [facility] and spoke with pharmacist Theresia Lo. Cough syrup rx has been filled and p/u this am around 11am.

## 2017-10-07 NOTE — Telephone Encounter (Signed)
Pt spouse calling a bit upset stating patient received a call back yesterday stating Dr Mortimer Fries did not order a cough syrup.  He states they were not told that we were going to send anything in  I did tell them we sent it in but he states that the pharmacy explained to them that it needed to be a written prescription. If it a strong cough syrup   He is going to call pharmacy and see if they have it or not   Please call patient back

## 2017-10-15 ENCOUNTER — Ambulatory Visit (INDEPENDENT_AMBULATORY_CARE_PROVIDER_SITE_OTHER): Payer: Medicare Other

## 2017-10-15 DIAGNOSIS — Z5181 Encounter for therapeutic drug level monitoring: Secondary | ICD-10-CM

## 2017-10-15 DIAGNOSIS — Z953 Presence of xenogenic heart valve: Secondary | ICD-10-CM | POA: Diagnosis not present

## 2017-10-15 DIAGNOSIS — I340 Nonrheumatic mitral (valve) insufficiency: Secondary | ICD-10-CM

## 2017-10-15 LAB — POCT INR: INR: 4

## 2017-10-15 NOTE — Patient Instructions (Signed)
Please skip coumadin tonight, then start new dosage of 1.5 tablets every day, except 1 tablet on Mondays and Fridays. Recheck INR in 2 weeks.

## 2017-10-17 ENCOUNTER — Ambulatory Visit: Payer: Medicare Other | Admitting: Cardiovascular Disease

## 2017-10-23 ENCOUNTER — Ambulatory Visit (INDEPENDENT_AMBULATORY_CARE_PROVIDER_SITE_OTHER): Payer: Medicare Other | Admitting: Cardiovascular Disease

## 2017-10-23 ENCOUNTER — Encounter: Payer: Self-pay | Admitting: Cardiovascular Disease

## 2017-10-23 VITALS — BP 100/50 | HR 65 | Ht 61.5 in | Wt 131.8 lb

## 2017-10-23 DIAGNOSIS — I5022 Chronic systolic (congestive) heart failure: Secondary | ICD-10-CM | POA: Diagnosis not present

## 2017-10-23 DIAGNOSIS — Z9889 Other specified postprocedural states: Secondary | ICD-10-CM | POA: Diagnosis not present

## 2017-10-23 DIAGNOSIS — E785 Hyperlipidemia, unspecified: Secondary | ICD-10-CM | POA: Diagnosis not present

## 2017-10-23 DIAGNOSIS — I481 Persistent atrial fibrillation: Secondary | ICD-10-CM

## 2017-10-23 DIAGNOSIS — I10 Essential (primary) hypertension: Secondary | ICD-10-CM | POA: Diagnosis not present

## 2017-10-23 DIAGNOSIS — I4819 Other persistent atrial fibrillation: Secondary | ICD-10-CM

## 2017-10-23 NOTE — Patient Instructions (Signed)
Medication Instructions:  Your physician has recommended you make the following change in your medication:  1- STOP Spironolactone.   Labwork: none  Testing/Procedures: none  Follow-Up: Your physician wants you to follow-up in: [place].  You will receive a reminder letter in the mail two months in advance. If you don't receive a letter, please call our office to schedule the follow-up appointment.  If you need a refill on your cardiac medications before your next appointment, please call your pharmacy.

## 2017-10-23 NOTE — Progress Notes (Signed)
Cardiology Office Note   Date:  10/23/2017   ID:  [name], DOB 13-Mar-1935, MRN [id number]  PCP:  Jamie Kayser, MD  Cardiologist:   Jamie Sacramento, MD   Chief Complaint  Patient presents with  . OTHER    6 month f/u c/o weakness.Meds reviewed verbally with pt.      History of Present Illness: [name] is a 83 y.o. female who presents for  a follow-up visit regarding chronic systolic heart failure, atrial fibrillation and mitral regurgitation.  She has known history of COPD, breast cancer status post right partial mastectomy, hypothyroidism, hyperlipidemia and Mnire disease.   She underwent mitral valve replacement with a bioprosthetic valve, tricuspid valve repair, one-vessel CABG with LIMA to LAD and maze procedure in August 2016. This was complicated by sternal wound infection which required debridement. She also had complete heart block and underwent permanent pacemaker placement. She initially had worsening Ejection fraction after surgery but most recent echocardiogram in January 2018 showed an EF of 50-55% with intact mitral valve and normal pulmonary pressure.  She has been doing very well and denies any chest pain.  She reports stable exertional dyspnea.  She did have worsening COPD in the setting of upper respiratory tract infection.  That has improved.  Blood pressure continues to be on the low side.  She complains of dizziness and fatigue.  Past Medical History:  Diagnosis Date  . Anxiety   . Arthritis    "hands" (11/10/2015)  . Breast cancer ([place]) 2002   right breast  . CAD s/p CABG x 1    a. 04/2015 LIMA to LAD  . Cancer of right breast ([place]) 08/09/2001  . Chronic diastolic CHF (congestive heart failure) ([place])   . COPD (chronic obstructive pulmonary disease) ([place])   . COPD (chronic obstructive pulmonary disease) ([place])   . GERD (gastroesophageal reflux disease)   . Hyperlipidemia   . Hypertension   . Hypothyroidism   . Maze operation for AF w/ LAA  clipping    a. 04/2015 Complete bilateral atrial lesion set using cryothermy and bipolar radiofrequency ablation with clipping of LA appendage  . Meniere disease   . On home oxygen therapy    "2L at night" (11/10/2015)  . PAF (paroxysmal atrial fibrillation) ([place])   . Pneumonia ~ 2010  . PONV (postoperative nausea and vomiting)    Pt felt like eardrums were gonna pop  . Post-surgical complete heart block, symptomatic    a. 04/2015 s/p MDT [id number] Advisa DR MRI DC PPM.  . Presence of permanent cardiac pacemaker   . Pulmonary hypertension ([place])   . Severe Mitral Regurgitation s/p MV Repair    a. 04/2015 s/p 27 mm [facility] Mitral bovine bioprosthetic tissue valve  . Surgical wound, non healing - chest wall 11/10/2015  . Tricuspid Regurgitation s/p Repair    a. 04/2015 s/p 26 mm Edwards mc3 ring annuloplasty  . Wound infection 10/13/2015   Superficial sternal wound infection    Past Surgical History:  Procedure Laterality Date  . APPLICATION OF A-CELL OF CHEST/ABDOMEN N/A 11/21/2015   Procedure: APPLICATION OF A-CELL OF CHEST/ABDOMEN;  Surgeon: Loel Lofty Dillingham, DO;  Location: [place];  Service: Plastics;  Laterality: N/A;  . APPLICATION OF A-CELL OF CHEST/ABDOMEN Right 12/21/2015   Procedure: APPLICATION OF A-CELL OF RIGHT CHEST;  Surgeon: Loel Lofty Dillingham, DO;  Location: [place];  Service: Plastics;  Laterality: Right;  . APPLICATION OF A-CELL OF CHEST/ABDOMEN Right 01/11/2016  Procedure: APPLICATION OF A-CELL OF RIGHT CHEST;  Surgeon: Loel Lofty Dillingham, DO;  Location: [place];  Service: Plastics;  Laterality: Right;  . APPLICATION OF A-CELL OF CHEST/ABDOMEN Right 02/12/2016   Procedure: APPLICATION OF A-CELL TO RIGHT CHEST WOUND;  Surgeon: Loel Lofty Dillingham, DO;  Location: [place];  Service: Plastics;  Laterality: Right;  . APPLICATION OF WOUND VAC N/A 05/09/2015   Procedure: APPLICATION OF WOUND VAC;  Surgeon: Rexene Alberts, MD;  Location: [place];  Service: Thoracic;  Laterality: N/A;  .  APPLICATION OF WOUND VAC N/A 11/13/2015   Procedure: APPLICATION OF WOUND VAC;  Surgeon: Rexene Alberts, MD;  Location: [place];  Service: Thoracic;  Laterality: N/A;  . APPLICATION OF WOUND VAC N/A 11/21/2015   Procedure: APPLICATION OF WOUND VAC;  Surgeon: Loel Lofty Dillingham, DO;  Location: [place];  Service: Plastics;  Laterality: N/A;  . APPLICATION OF WOUND VAC Right 12/21/2015   Procedure: APPLICATION OF WOUND VAC to right chest wall ;  Surgeon: Loel Lofty Dillingham, DO;  Location: [place];  Service: Plastics;  Laterality: Right;  . APPLICATION OF WOUND VAC Right 01/11/2016   Procedure: APPLICATION OF WOUND VAC RIGHT CHEST ;  Surgeon: Loel Lofty Dillingham, DO;  Location: [place];  Service: Plastics;  Laterality: Right;  . APPLICATION OF WOUND VAC Right 01/24/2016   Procedure: APPLICATION OF WOUND VAC RIGHT CHEST WALL;  Surgeon: Loel Lofty Dillingham, DO;  Location: [place];  Service: Plastics;  Laterality: Right;  . APPLICATION OF WOUND VAC Right 02/12/2016   Procedure: RE-APPLICATION OF WOUND VAC TO RIGHT CHEST WOUND;  Surgeon: Loel Lofty Dillingham, DO;  Location: [place];  Service: Plastics;  Laterality: Right;  . BREAST BIOPSY Left 11/25/06   neg  . BREAST BIOPSY Left 01/20/12   /clip-neg  . CARDIAC CATHETERIZATION  11/2013   [facility]  . CARDIAC CATHETERIZATION  10/2014   [facility]  . CARDIAC VALVE REPLACEMENT    . CATARACT EXTRACTION W/ INTRAOCULAR LENS  IMPLANT, BILATERAL Bilateral 2014  . CLIPPING OF ATRIAL APPENDAGE N/A 04/25/2015   Procedure: CLIPPING OF ATRIAL APPENDAGE;  Surgeon: Rexene Alberts, MD;  Location: [place];  Service: Open Heart Surgery;  Laterality: N/A;  . COCHLEAR IMPLANT Left 2005?  . CORONARY ARTERY BYPASS GRAFT N/A 04/25/2015   Procedure: CORONARY ARTERY BYPASS GRAFTING (CABG) x ONE, using left internal mammary artery;  Surgeon: Rexene Alberts, MD;  Location: [place];  Service: Open Heart Surgery;  Laterality: N/A;  . DILATION AND CURETTAGE OF UTERUS  "several before hysterectomy"  . EP  IMPLANTABLE DEVICE N/A 05/02/2015   Procedure: Pacemaker Implant;  Surgeon: [place] Grayer, MD;  Location: [place] [facility];  Service: Cardiovascular;  Laterality: N/A;  . I&D EXTREMITY Right 12/06/2015   Procedure: IRRIGATION AND DEBRIDEMENT RIGHT CHEST WALL WITH ACELL PLACEMENT AND VAC;  Surgeon: Loel Lofty Dillingham, DO;  Location: [place];  Service: Plastics;  Laterality: Right;  . INCISION AND DRAINAGE OF WOUND N/A 11/21/2015   Procedure: IRRIGATION AND DEBRIDEMENT WOUND;  Surgeon: Loel Lofty Dillingham, DO;  Location: [place];  Service: Plastics;  Laterality: N/A;  . INCISION AND DRAINAGE OF WOUND Right 12/21/2015   Procedure: IRRIGATION AND DEBRIDEMENT right chest wall WOUND;  Surgeon: Loel Lofty Dillingham, DO;  Location: [place];  Service: Plastics;  Laterality: Right;  right chest wall   . INCISION AND DRAINAGE OF WOUND Right 01/24/2016   Procedure: IRRIGATION AND DEBRIDEMENT RIGHT CHEST WALL WOUND;  Surgeon: Loel Lofty Dillingham, DO;  Location: [place];  Service: Plastics;  Laterality: Right;  . INCISION AND DRAINAGE OF WOUND Right 03/28/2016   Procedure: IRRIGATION AND DEBRIDEMENT RIGHT CHEST WALL WOUND WITH A Cell Placement;  Surgeon: Loel Lofty Dillingham, DO;  Location: [place];  Service: Plastics;  Laterality: Right;  . INCISION AND DRAINAGE OF WOUND Right 05/17/2016   Procedure: IRRIGATION AND DEBRIDEMENT OF RIGHT CHEST WOUND WITH A CELL PLACEMENT;  Surgeon: Loel Lofty Dillingham, DO;  Location: [place];  Service: Plastics;  Laterality: Right;  . INSERT / REPLACE / REMOVE PACEMAKER    . IRRIGATION AND DEBRIDEMENT OF WOUND WITH SPLIT THICKNESS SKIN GRAFT Right 01/11/2016   Procedure: IRRIGATION AND DEBRIDEMENT OF RIGHT CHEST WOUND ;  Surgeon: Loel Lofty Dillingham, DO;  Location: [place];  Service: Plastics;  Laterality: Right;  . MASTECTOMY, PARTIAL Right 2002   positive  . MAZE N/A 04/25/2015   Procedure: MAZE;  Surgeon: Rexene Alberts, MD;  Location: [place];  Service: Open Heart Surgery;  Laterality: N/A;  .  MITRAL VALVE REPAIR N/A 04/25/2015   Procedure: MITRAL VALVE  REPLACEMENT using a 27 mm Edwards Perimount Magna Mitral Ease Valve;  Surgeon: Rexene Alberts, MD;  Location: [place];  Service: Open Heart Surgery;  Laterality: N/A;  . SKIN SPLIT GRAFT Right 02/12/2016   Procedure: IRRIGATION AND DEBRIDEMENT RIGHT CHEST WOUND;  Surgeon: Loel Lofty Dillingham, DO;  Location: [place];  Service: Plastics;  Laterality: Right;  . STERNAL WIRES REMOVAL N/A 06/05/2015   Procedure: STERNAL WIRES REMOVAL;  Surgeon: Rexene Alberts, MD;  Location: [place];  Service: Thoracic;  Laterality: N/A;  . STERNAL WOUND DEBRIDEMENT N/A 05/09/2015   Procedure: STERNAL WOUND DEBRIDEMENT;  Surgeon: Rexene Alberts, MD;  Location: [place];  Service: Thoracic;  Laterality: N/A;  . STERNAL WOUND DEBRIDEMENT N/A 11/13/2015   Procedure: Excisional drainage of RIGHT Chest wall mass and breast mass ;  Surgeon: Rexene Alberts, MD;  Location: [place];  Service: Thoracic;  Laterality: N/A;  . TEE WITHOUT CARDIOVERSION N/A 04/25/2015   Procedure: TRANSESOPHAGEAL ECHOCARDIOGRAM (TEE);  Surgeon: Rexene Alberts, MD;  Location: [place];  Service: Open Heart Surgery;  Laterality: N/A;  . TONSILLECTOMY    . TRAM Right 11/18/2015   Procedure: VRAM Vertical Rectus Abdominus Muscle Flap;  Surgeon: Loel Lofty Dillingham, DO;  Location: [place];  Service: Plastics;  Laterality: Right;  RIght Back  . TRICUSPID VALVE REPLACEMENT N/A 04/25/2015   Procedure: TRICUSPID VALVE REPAIR;  Surgeon: Rexene Alberts, MD;  Location: [place];  Service: Open Heart Surgery;  Laterality: N/A;  . VAGINAL HYSTERECTOMY       Current Outpatient Medications  Medication Sig Dispense Refill  . acetaminophen (TYLENOL) 325 MG tablet Take 2 tablets (650 mg total) by mouth every 4 (four) hours as needed for mild pain (or Fever >/= 101).    . AMBULATORY NON FORMULARY MEDICATION Inogen at Home Oxygen machine with tubing. DX: COPD DX Code: J44.9 1 each 0  . aspirin EC 81 MG EC tablet Take 1 tablet  (81 mg total) by mouth daily.    . benzonatate (TESSALON) 100 MG capsule Take 1 capsule (100 mg total) by mouth 3 (three) times daily as needed for cough. 20 capsule 0  . bisoprolol (ZEBETA) 10 MG tablet Take 1 tablet (10 mg total) by mouth daily. 30 tablet 5  . ferrous sulfate 325 (65 FE) MG tablet Take 325 mg by mouth daily with breakfast.    . fluticasone (  FLONASE) 50 MCG/ACT nasal spray Place 1 spray into both nostrils daily as needed for allergies.     . furosemide (LASIX) 20 MG tablet Take 20 mg by mouth 2 (two) times daily.     Marland Kitchen gabapentin (NEURONTIN) 100 MG capsule Take 200 mg by mouth at bedtime.   1  . guaiFENesin-codeine 100-10 MG/5ML syrup Take 5 mLs by mouth every 4 (four) hours as needed for cough. 120 mL 0  . ipratropium-albuterol (DUONEB) 0.5-2.5 (3) MG/3ML SOLN Take 3 mLs by nebulization every 6 (six) hours as needed. 360 mL 1  . levothyroxine (SYNTHROID, LEVOTHROID) 88 MCG tablet Take 88 mcg by mouth daily before breakfast.    . losartan (COZAAR) 100 MG tablet Take 1 tablet (100 mg total) by mouth daily. 30 tablet 3  . Multiple Vitamins-Minerals (PRESERVISION AREDS 2 PO) Take 2 tablets by mouth daily.    . pantoprazole (PROTONIX) 40 MG tablet Take 40 mg by mouth 2 (two) times daily.     . potassium chloride SA (K-DUR,KLOR-CON) 20 MEQ tablet Take 20 mEq by mouth daily.  2  . simvastatin (ZOCOR) 20 MG tablet Take 20 mg by mouth at bedtime.     Marland Kitchen spironolactone (ALDACTONE) 25 MG tablet Take 1 tablet (25 mg total) by mouth daily. 30 tablet 6  . warfarin (COUMADIN) 2.5 MG tablet TAKE AS DIRECTED BY COUMADIN CLINIC 55 tablet 3   No current facility-administered medications for this visit.     Allergies:   Ace inhibitors; Augmentin [amoxicillin-pot clavulanate]; Penicillins; Nifedipine; Propranolol; Diazepam; Meperidine; and Propoxyphene    Social History:  The patient  reports that she quit smoking about 19 years ago. Her smoking use included cigarettes. She has a 60.00 pack-year  smoking history. she has never used smokeless tobacco. She reports that she drinks alcohol. She reports that she does not use drugs.   Family History:  The patient's family history includes Breast cancer in her maternal aunt and paternal aunt; Heart attack in her paternal uncle; Heart disease in her brother and father.    ROS:  Please see the history of present illness.   Otherwise, review of systems are positive for none.   All other systems are reviewed and negative.    PHYSICAL EXAM: VS:  BP (!) 100/50 (BP Location: Left Arm, Patient Position: Sitting, Cuff Size: Normal)   Pulse 65   Ht 5' 1.5" (1.562 m)   Wt 131 lb 12 oz (59.8 kg)   BMI 24.49 kg/m  , BMI Body mass index is 24.49 kg/m. GEN: Well nourished, well developed, in no acute distress  HEENT: normal  Neck: no JVD, carotid bruits, or masses Cardiac: RRR; no murmurs, rubs, or gallops,no edema  Respiratory:  clear to auscultation bilaterally with mildly diminished breath sounds, normal work of breathing GI: soft, nontender, nondistended, + BS MS: no deformity or atrophy  Skin: warm and dry, no rash Neuro:  Strength and sensation are intact Psych: euthymic mood, full affect   EKG:  EKG is ordered today. The ekg ordered today demonstrates atrial paced rhythm , pulmonary disease pattern  Recent Labs: No results found for requested labs within last 8760 hours.    Lipid Panel No results found for: CHOL, TRIG, HDL, CHOLHDL, VLDL, LDLCALC, LDLDIRECT    Wt Readings from Last 3 Encounters:  10/23/17 131 lb 12 oz (59.8 kg)  10/06/17 133 lb (60.3 kg)  06/03/17 134 lb 12 oz (61.1 kg)       ASSESSMENT AND PLAN:  1.  Chronic systolic heart failure: She is doing reasonably well and appears to be euvolemic.  Most recent ejection fraction showed an EF of 50-55%.  Given low blood pressure and improvement in ejection fraction, I elected to discontinue spironolactone.  2. Status post mitral valve replacement with a  bioprosthetic valve: This was intact on most recent echocardiogram.  3. Coronary artery disease status post one-vessel LIMA to LAD: No anginal symptoms  4. Persistent atrial fibrillation: No evidence of recurrent atrial fibrillation since her surgery even after stopping amiodarone.  Currently on anticoagulation with warfarin which is managed by our coagulation clinic  5.  Hyperlipidemia: Currently on simvastatin.  Disposition:   FU with me in 6 months  Signed,  Jamie Sacramento, MD  10/23/2017 9:52 AM    [place]

## 2017-10-28 ENCOUNTER — Other Ambulatory Visit: Payer: Self-pay | Admitting: Internal Medicine

## 2017-10-28 MED ORDER — IPRATROPIUM-ALBUTEROL 0.5-2.5 (3) MG/3ML IN SOLN: 3.0000 mL | Freq: Four times a day (QID) | RESPIRATORY_TRACT | 1 refills | Status: DC | PRN

## 2017-10-29 ENCOUNTER — Ambulatory Visit (INDEPENDENT_AMBULATORY_CARE_PROVIDER_SITE_OTHER): Payer: Medicare Other

## 2017-10-29 DIAGNOSIS — I340 Nonrheumatic mitral (valve) insufficiency: Secondary | ICD-10-CM

## 2017-10-29 DIAGNOSIS — Z953 Presence of xenogenic heart valve: Secondary | ICD-10-CM | POA: Diagnosis not present

## 2017-10-29 DIAGNOSIS — Z5181 Encounter for therapeutic drug level monitoring: Secondary | ICD-10-CM | POA: Diagnosis not present

## 2017-10-29 LAB — POCT INR: INR: 2.2

## 2017-10-29 NOTE — Patient Instructions (Signed)
Please continue current dosage of 1.5 tablets every day, except 1 tablet on Mondays and Fridays. Recheck INR in 3 weeks.

## 2017-11-05 ENCOUNTER — Encounter: Payer: Self-pay | Admitting: Pulmonary Disease

## 2017-11-05 ENCOUNTER — Ambulatory Visit (INDEPENDENT_AMBULATORY_CARE_PROVIDER_SITE_OTHER): Payer: Medicare Other | Admitting: Pulmonary Disease

## 2017-11-05 VITALS — BP 122/78 | HR 73 | Ht 61.5 in | Wt 133.0 lb

## 2017-11-05 DIAGNOSIS — J441 Chronic obstructive pulmonary disease with (acute) exacerbation: Secondary | ICD-10-CM | POA: Diagnosis not present

## 2017-11-05 DIAGNOSIS — G4734 Idiopathic sleep related nonobstructive alveolar hypoventilation: Secondary | ICD-10-CM

## 2017-11-05 DIAGNOSIS — J449 Chronic obstructive pulmonary disease, unspecified: Secondary | ICD-10-CM

## 2017-11-05 NOTE — Progress Notes (Signed)
PULMONARY OFFICE FOLLOW UP NOTE  Date of initial consultation: 07/27/15 Reason for consultation: COPD  PT PROFILE:  83 y.o. F initially seen in 2016 for COPD. Previously followed by Dr Raul Del. She was first diagnosed with COPD in 2012 at the time of a pneumonia (was not hospitalized).   DATA:. PFTs 03/17/15: Spirometry reveals mild/moderate obstruction (FEV1 67% predicted, FEV1/FVC 62%).  Lung volumes normal.  Diffusion capacity markedly reduced at 35% predicted.  DLCO/VA 54% predicted Echocardiogram 08/22/15: LVEF 30-35%. Diffuse HK. prosthetic valves functioning well ONO on RA 02/22/16: significant desaturation to low of 78%. Total desaturation events: 170 Echocardiogram 09/17/16:   PROBLEMS: Mild-moderate COPD CHF S/P Maze procedure, CABG with chronic sternal wound infection   INTERVAL: Last seen by me 2017. Seen by DK 10/06/17 and treated as COPD exacerbation pred/azithro  SUBJ: She has recovered back to her baseline after being treated for COPD exacerbation by Dr. Mortimer Fries last month.  Her baseline is mild to moderate exertional dyspnea with little day-to-day variation.  In the past we have tried various maintenance inhaler medications without significant change in her symptoms.  She continues to wear her oxygen at night.  She is not really using it at all during the day or with exertion.  Her husband requests a portable oxygen concentrator to facilitate travel.  He is willing to pay for it out of pocket.  Her oxygen supply company is [employer].  Presently, she denies CP, fever, purulent sputum, hemoptysis, LE edema and calf tenderness.    Vitals:   11/05/17 0927 11/05/17 0934  BP:  122/78  Pulse:  73  SpO2:  92%  Weight: 60.3 kg (133 lb)   Height: 5' 1.5" (1.562 m)    EXAM:  Gen: Pleasant, NAD HEENT: NCAT, sclerae white, oropharynx normal Neck: No LAN, no JVD noted Lungs: slightly diminshed BS throughout without wheezes or other adventitious sounds Cardiovascular: Reg, no M  noted Abdomen: Soft, NT +BS Ext: no C/C/E Neuro: CNs intact, motor/sens grossly intact  BMP Latest Ref Rng & Units 06/30/2016 06/29/2016 05/17/2016  Glucose 65 - 99 mg/dL 146(H) 152(H) 113(H)  BUN 6 - 20 mg/dL 22(H) 16 14  Creatinine 0.44 - 1.00 mg/dL 0.63 0.96 0.68  BUN/Creat Ratio 11 - 26 - - -  Sodium 135 - 145 mmol/L 141 141 141  Potassium 3.5 - 5.1 mmol/L 4.7 4.4 4.0  Chloride 101 - 111 mmol/L 101 106 105  CO2 22 - 32 mmol/L 32 26 26  Calcium 8.9 - 10.3 mg/dL 9.2 9.1 9.6    CBC Latest Ref Rng & Units 06/30/2016 06/29/2016 05/17/2016  WBC 3.6 - 11.0 K/uL 8.3 10.9 7.5  Hemoglobin 12.0 - 16.0 g/dL 11.0(L) 10.4(L) 10.0(L)  Hematocrit 35.0 - 47.0 % 32.8(L) 31.4(L) 33.6(L)  Platelets 150 - 440 K/uL 239 193 186   CXR 10/06/2017: Unchanged patchy bilateral opacities  IMPRESSION: Mild COPD, chronic bronchitis  Recent COPD exacerbation, now fully treated and resolved Nocturnal hypoxemia   PLAN: Cont nocturnal O2 (she is wearing this compliantly)  We discussed the nature of COPD exacerbations and I encouraged that she contact us if such symptoms recur.  It is of note that she has been hospitalized on one occasion in the past couple of years for a COPD exacerbation.  I emphasized that we should try to avoid letting things get to the point such that they require hospitalization.  Since she has not benefited from maintenance inhaler therapy in the past, she wishes to not initiate those therapies presently  Follow-up in 1 year or sooner as needed  Merton Border, MD PCCM service Mobile [phone] Pager [phone]  11/05/2017 10:03 AM

## 2017-11-05 NOTE — Patient Instructions (Signed)
Continue oxygen with sleep Follow-up as needed for any change in your breathing or pulmonary symptoms Otherwise, we will see you once a year to keep tabs on your oxygen requirements Our office will help you look into obtaining a light weight portable oxygen concentrator

## 2017-11-07 ENCOUNTER — Other Ambulatory Visit: Payer: Self-pay | Admitting: *Deleted

## 2017-11-07 NOTE — Telephone Encounter (Signed)
Pharmacy requesting a ninety day rx. 

## 2017-11-10 ENCOUNTER — Other Ambulatory Visit: Payer: Self-pay | Admitting: Cardiovascular Disease

## 2017-11-10 MED ORDER — WARFARIN SODIUM 2.5 MG PO TABS: ORAL_TABLET | ORAL | 2 refills | Status: DC

## 2017-11-19 ENCOUNTER — Ambulatory Visit (INDEPENDENT_AMBULATORY_CARE_PROVIDER_SITE_OTHER): Payer: Medicare Other

## 2017-11-19 DIAGNOSIS — Z953 Presence of xenogenic heart valve: Secondary | ICD-10-CM | POA: Diagnosis not present

## 2017-11-19 DIAGNOSIS — Z5181 Encounter for therapeutic drug level monitoring: Secondary | ICD-10-CM

## 2017-11-19 DIAGNOSIS — I340 Nonrheumatic mitral (valve) insufficiency: Secondary | ICD-10-CM

## 2017-11-19 LAB — POCT INR: INR: 1.9

## 2017-11-19 NOTE — Patient Instructions (Signed)
Please take extra 1/2 tablet on Friday, then continue current dosage of 1.5 tablets every day, except 1 tablet on Mondays and Fridays. Recheck INR in 4 weeks.

## 2017-12-10 ENCOUNTER — Ambulatory Visit (INDEPENDENT_AMBULATORY_CARE_PROVIDER_SITE_OTHER): Payer: Medicare Other | Admitting: *Deleted

## 2017-12-10 DIAGNOSIS — I442 Atrioventricular block, complete: Secondary | ICD-10-CM | POA: Diagnosis not present

## 2017-12-10 NOTE — Progress Notes (Signed)
Remote pacemaker transmission.   

## 2017-12-11 ENCOUNTER — Encounter: Payer: Self-pay | Admitting: Cardiology

## 2017-12-16 ENCOUNTER — Telehealth: Payer: Self-pay | Admitting: Cardiovascular Disease

## 2017-12-16 NOTE — Telephone Encounter (Signed)
Last echo 09/2016. Last office visit 10/23/17 with Dr Fletcher Anon. On Warfarin for PAF. Hx s/p mitral valve replacement. Routing to to Dr Fletcher Anon.

## 2017-12-16 NOTE — Telephone Encounter (Signed)
° °  [facility] Pre-operative Risk Assessment    Request for surgical clearance:  1. What type of surgery is being performed? Colonoscopy and egd   2. When is this surgery scheduled? 01/20/18  3. What type of clearance is required (medical clearance vs. Pharmacy clearance to hold med vs. Both)? both  4. Are there any medications that need to be held prior to surgery and how long? Coumadin   5. Practice name and name of physician performing surgery? [facility]   6. What is your office phone and fax number? [phone] fax [phone]  7. Anesthesia type (None, local, MAC, general) ? Monitored    Clarisse Gouge 12/16/2017, 10:16 AM  _________________________________________________________________   (provider comments below)

## 2017-12-17 ENCOUNTER — Other Ambulatory Visit: Payer: Self-pay | Admitting: Internal Medicine

## 2017-12-17 ENCOUNTER — Ambulatory Visit (INDEPENDENT_AMBULATORY_CARE_PROVIDER_SITE_OTHER): Payer: Medicare Other

## 2017-12-17 DIAGNOSIS — I340 Nonrheumatic mitral (valve) insufficiency: Secondary | ICD-10-CM | POA: Diagnosis not present

## 2017-12-17 DIAGNOSIS — Z5181 Encounter for therapeutic drug level monitoring: Secondary | ICD-10-CM | POA: Diagnosis not present

## 2017-12-17 DIAGNOSIS — Z953 Presence of xenogenic heart valve: Secondary | ICD-10-CM

## 2017-12-17 DIAGNOSIS — Z1231 Encounter for screening mammogram for malignant neoplasm of breast: Secondary | ICD-10-CM

## 2017-12-17 LAB — POCT INR: INR: 1.9

## 2017-12-17 NOTE — Telephone Encounter (Signed)
She is at moderate risk given chronic medical conditions.  Warfarin can be held 5 days before procedures and resumed the day of procedure.

## 2017-12-17 NOTE — Patient Instructions (Signed)
Please take 2 tablets tonight, then continue current dosage of 1.5 tablets every day, except 1 tablet on Mondays and Fridays. Recheck INR in 4 weeks.

## 2017-12-18 NOTE — Telephone Encounter (Signed)
Faxed clearance through Epic to [facility] [phone]

## 2017-12-23 LAB — CUP PACEART REMOTE DEVICE CHECK
Battery Voltage: 3.02 V
Brady Statistic AP VP Percent: 0.16 %
Brady Statistic AS VP Percent: 0.01 %
Brady Statistic AS VS Percent: 10.73 %
Brady Statistic RA Percent Paced: 87.87 %
Implantable Lead Implant Date: 20160823
Implantable Lead Location: 753860
Implantable Lead Model: 5076
Lead Channel Impedance Value: 361 Ohm
Lead Channel Impedance Value: 456 Ohm
Lead Channel Pacing Threshold Amplitude: 0.5 V
Lead Channel Pacing Threshold Amplitude: 0.625 V
Lead Channel Pacing Threshold Pulse Width: 0.4 ms
Lead Channel Pacing Threshold Pulse Width: 0.4 ms
Lead Channel Sensing Intrinsic Amplitude: 11.75 mV
Lead Channel Sensing Intrinsic Amplitude: 2 mV
Lead Channel Sensing Intrinsic Amplitude: 2 mV
MDC IDC LEAD IMPLANT DT: 20160823
MDC IDC LEAD LOCATION: 753859
MDC IDC MSMT BATTERY REMAINING LONGEVITY: 92 mo
MDC IDC MSMT LEADCHNL RV IMPEDANCE VALUE: 418 Ohm
MDC IDC MSMT LEADCHNL RV IMPEDANCE VALUE: 437 Ohm
MDC IDC MSMT LEADCHNL RV SENSING INTR AMPL: 11.75 mV
MDC IDC PG IMPLANT DT: 20160823
MDC IDC SESS DTM: 20190403184136
MDC IDC SET LEADCHNL RA PACING AMPLITUDE: 2 V
MDC IDC SET LEADCHNL RV PACING AMPLITUDE: 2.5 V
MDC IDC SET LEADCHNL RV PACING PULSEWIDTH: 0.4 ms
MDC IDC SET LEADCHNL RV SENSING SENSITIVITY: 2.8 mV
MDC IDC STAT BRADY AP VS PERCENT: 89.1 %
MDC IDC STAT BRADY RV PERCENT PACED: 0.22 %

## 2018-01-14 ENCOUNTER — Ambulatory Visit (INDEPENDENT_AMBULATORY_CARE_PROVIDER_SITE_OTHER): Payer: Medicare Other

## 2018-01-14 DIAGNOSIS — Z953 Presence of xenogenic heart valve: Secondary | ICD-10-CM | POA: Diagnosis not present

## 2018-01-14 DIAGNOSIS — I340 Nonrheumatic mitral (valve) insufficiency: Secondary | ICD-10-CM | POA: Diagnosis not present

## 2018-01-14 DIAGNOSIS — Z5181 Encounter for therapeutic drug level monitoring: Secondary | ICD-10-CM | POA: Diagnosis not present

## 2018-01-14 LAB — POCT INR: INR: 2.1

## 2018-01-14 NOTE — Patient Instructions (Signed)
Please take your last dose of coumadin tonight, then HOLD coumadin until Tuesday night.  Take extra 1/2 tablet on Wednesday, May 15 & Thursday, May 16. Recheck INR on Wednesday, May 22.

## 2018-01-19 ENCOUNTER — Encounter: Payer: Self-pay | Admitting: *Deleted

## 2018-01-20 ENCOUNTER — Encounter: Payer: Self-pay | Admitting: Anesthesiology

## 2018-01-20 ENCOUNTER — Encounter
Admission: RE | Disposition: A | Discharge status: Home / Self Care | Payer: Self-pay | Source: Ambulatory Visit | Attending: Internal Medicine

## 2018-01-20 ENCOUNTER — Ambulatory Visit: Payer: Medicare Other | Admitting: Certified Registered"

## 2018-01-20 ENCOUNTER — Ambulatory Visit
Admission: RE | Admit: 2018-01-20 | Discharge: 2018-01-20 | Disposition: A | Discharge status: Home / Self Care | Payer: Medicare Other | Source: Ambulatory Visit | Attending: Internal Medicine | Admitting: Internal Medicine

## 2018-01-20 DIAGNOSIS — Z853 Personal history of malignant neoplasm of breast: Secondary | ICD-10-CM | POA: Diagnosis not present

## 2018-01-20 DIAGNOSIS — Z9621 Cochlear implant status: Secondary | ICD-10-CM | POA: Insufficient documentation

## 2018-01-20 DIAGNOSIS — K5790 Diverticulosis of intestine, part unspecified, without perforation or abscess without bleeding: Secondary | ICD-10-CM | POA: Insufficient documentation

## 2018-01-20 DIAGNOSIS — D500 Iron deficiency anemia secondary to blood loss (chronic): Secondary | ICD-10-CM | POA: Insufficient documentation

## 2018-01-20 DIAGNOSIS — E039 Hypothyroidism, unspecified: Secondary | ICD-10-CM | POA: Insufficient documentation

## 2018-01-20 DIAGNOSIS — Z880 Allergy status to penicillin: Secondary | ICD-10-CM | POA: Insufficient documentation

## 2018-01-20 DIAGNOSIS — I110 Hypertensive heart disease with heart failure: Secondary | ICD-10-CM | POA: Diagnosis not present

## 2018-01-20 DIAGNOSIS — K219 Gastro-esophageal reflux disease without esophagitis: Secondary | ICD-10-CM | POA: Insufficient documentation

## 2018-01-20 DIAGNOSIS — Z7901 Long term (current) use of anticoagulants: Secondary | ICD-10-CM | POA: Insufficient documentation

## 2018-01-20 DIAGNOSIS — Z79899 Other long term (current) drug therapy: Secondary | ICD-10-CM | POA: Insufficient documentation

## 2018-01-20 DIAGNOSIS — Z950 Presence of cardiac pacemaker: Secondary | ICD-10-CM | POA: Diagnosis not present

## 2018-01-20 DIAGNOSIS — E7800 Pure hypercholesterolemia, unspecified: Secondary | ICD-10-CM | POA: Insufficient documentation

## 2018-01-20 DIAGNOSIS — J449 Chronic obstructive pulmonary disease, unspecified: Secondary | ICD-10-CM | POA: Insufficient documentation

## 2018-01-20 DIAGNOSIS — K921 Melena: Secondary | ICD-10-CM | POA: Diagnosis present

## 2018-01-20 DIAGNOSIS — I5042 Chronic combined systolic (congestive) and diastolic (congestive) heart failure: Secondary | ICD-10-CM | POA: Diagnosis not present

## 2018-01-20 DIAGNOSIS — I272 Pulmonary hypertension, unspecified: Secondary | ICD-10-CM | POA: Insufficient documentation

## 2018-01-20 DIAGNOSIS — R202 Paresthesia of skin: Secondary | ICD-10-CM | POA: Insufficient documentation

## 2018-01-20 DIAGNOSIS — Z9842 Cataract extraction status, left eye: Secondary | ICD-10-CM | POA: Insufficient documentation

## 2018-01-20 DIAGNOSIS — K621 Rectal polyp: Secondary | ICD-10-CM | POA: Diagnosis not present

## 2018-01-20 DIAGNOSIS — Z7982 Long term (current) use of aspirin: Secondary | ICD-10-CM | POA: Insufficient documentation

## 2018-01-20 DIAGNOSIS — R2689 Other abnormalities of gait and mobility: Secondary | ICD-10-CM | POA: Diagnosis not present

## 2018-01-20 DIAGNOSIS — E119 Type 2 diabetes mellitus without complications: Secondary | ICD-10-CM | POA: Diagnosis not present

## 2018-01-20 DIAGNOSIS — Z888 Allergy status to other drugs, medicaments and biological substances status: Secondary | ICD-10-CM | POA: Insufficient documentation

## 2018-01-20 DIAGNOSIS — Z951 Presence of aortocoronary bypass graft: Secondary | ICD-10-CM | POA: Insufficient documentation

## 2018-01-20 DIAGNOSIS — H905 Unspecified sensorineural hearing loss: Secondary | ICD-10-CM | POA: Insufficient documentation

## 2018-01-20 DIAGNOSIS — I480 Paroxysmal atrial fibrillation: Secondary | ICD-10-CM | POA: Insufficient documentation

## 2018-01-20 DIAGNOSIS — K641 Second degree hemorrhoids: Secondary | ICD-10-CM | POA: Insufficient documentation

## 2018-01-20 DIAGNOSIS — Z86010 Personal history of colonic polyps: Secondary | ICD-10-CM | POA: Diagnosis not present

## 2018-01-20 DIAGNOSIS — Z9841 Cataract extraction status, right eye: Secondary | ICD-10-CM | POA: Insufficient documentation

## 2018-01-20 DIAGNOSIS — Z90710 Acquired absence of both cervix and uterus: Secondary | ICD-10-CM | POA: Insufficient documentation

## 2018-01-20 DIAGNOSIS — K449 Diaphragmatic hernia without obstruction or gangrene: Secondary | ICD-10-CM | POA: Insufficient documentation

## 2018-01-20 DIAGNOSIS — Z885 Allergy status to narcotic agent status: Secondary | ICD-10-CM | POA: Insufficient documentation

## 2018-01-20 DIAGNOSIS — Z9011 Acquired absence of right breast and nipple: Secondary | ICD-10-CM | POA: Insufficient documentation

## 2018-01-20 DIAGNOSIS — H8109 Meniere's disease, unspecified ear: Secondary | ICD-10-CM | POA: Insufficient documentation

## 2018-01-20 DIAGNOSIS — Z952 Presence of prosthetic heart valve: Secondary | ICD-10-CM | POA: Diagnosis not present

## 2018-01-20 HISTORY — DX: Personal history of colonic polyps: Z86.010

## 2018-01-20 HISTORY — PX: ESOPHAGOGASTRODUODENOSCOPY (EGD) WITH PROPOFOL: SHX5813

## 2018-01-20 HISTORY — PX: COLONOSCOPY WITH PROPOFOL: SHX5780

## 2018-01-20 HISTORY — DX: Personal history of colon polyps, unspecified: Z86.0100

## 2018-01-20 HISTORY — DX: Meniere's disease, unspecified ear: H81.09

## 2018-01-20 LAB — PROTIME-INR
INR: 1.02
Prothrombin Time: 13.3 seconds (ref 11.4–15.2)

## 2018-01-20 SURGERY — COLONOSCOPY WITH PROPOFOL
Anesthesia: General

## 2018-01-20 MED ORDER — SODIUM CHLORIDE 0.9 % IV SOLN
INTRAVENOUS | Status: DC
  Administered 2018-01-20: 1000 mL via INTRAVENOUS

## 2018-01-20 MED ORDER — PROPOFOL 10 MG/ML IV BOLUS
INTRAVENOUS | Status: DC | PRN
  Administered 2018-01-20 (×4): 50 mg via INTRAVENOUS

## 2018-01-20 MED ORDER — EPHEDRINE SULFATE 50 MG/ML IJ SOLN
INTRAMUSCULAR | Status: DC | PRN
  Administered 2018-01-20 (×4): 10 mg via INTRAVENOUS

## 2018-01-20 MED ORDER — GLYCOPYRROLATE 0.2 MG/ML IJ SOLN
INTRAMUSCULAR | Status: DC | PRN
  Administered 2018-01-20: 0.1 mg via INTRAVENOUS

## 2018-01-20 MED ORDER — LIDOCAINE HCL (CARDIAC) PF 100 MG/5ML IV SOSY
PREFILLED_SYRINGE | INTRAVENOUS | Status: DC | PRN
  Administered 2018-01-20: 40 mg via INTRAVENOUS

## 2018-01-20 MED ORDER — PROPOFOL 500 MG/50ML IV EMUL
INTRAVENOUS | Status: AC
  Filled 2018-01-20: qty 50

## 2018-01-20 MED ORDER — GLYCOPYRROLATE 0.2 MG/ML IJ SOLN
INTRAMUSCULAR | Status: AC
  Filled 2018-01-20: qty 1

## 2018-01-20 MED ORDER — LIDOCAINE HCL (PF) 2 % IJ SOLN
INTRAMUSCULAR | Status: AC
  Filled 2018-01-20: qty 10

## 2018-01-20 NOTE — Anesthesia Postprocedure Evaluation (Signed)
Anesthesia Post Note  Patient: [name]  Procedure(s) Performed: COLONOSCOPY WITH PROPOFOL (N/A ) ESOPHAGOGASTRODUODENOSCOPY (EGD) WITH PROPOFOL (N/A )  Patient location during evaluation: Endoscopy Anesthesia Type: General Level of consciousness: oriented, patient cooperative and awake and alert Pain management: satisfactory to patient Vital Signs Assessment: post-procedure vital signs reviewed and stable Respiratory status: spontaneous breathing and respiratory function stable Cardiovascular status: blood pressure returned to baseline and stable Postop Assessment: no headache, no backache, patient able to bend at knees, no apparent nausea or vomiting, adequate PO intake and able to ambulate Anesthetic complications: no     Last Vitals:  Vitals:   01/20/18 1020  BP: (!) 101/55  Pulse: 63  Resp: 18  Temp: (!) 36.1 C  SpO2: 95%    Last Pain:  Vitals:   01/20/18 1020  TempSrc: Tympanic                 Aarush Stukey H Lynasia Meloche

## 2018-01-20 NOTE — Anesthesia Preprocedure Evaluation (Addendum)
Anesthesia Evaluation  Patient identified by MRN, date of birth, ID band Patient awake    Reviewed: Allergy & Precautions, H&P , NPO status , Patient's Chart, lab work & pertinent test results, reviewed documented beta blocker date and time   History of Anesthesia Complications (+) PONV and history of anesthetic complications  Airway Mallampati: I  TM Distance: >3 FB Neck ROM: full    Dental  (+) Dental Advidsory Given, Teeth Intact   Pulmonary neg pulmonary ROS, shortness of breath and with exertion, neg sleep apnea, COPD,  COPD inhaler and oxygen dependent, neg recent URI, former smoker,           Cardiovascular Exercise Tolerance: Good hypertension, (-) angina+ CAD, + CABG and +CHF  (-) Past MI and (-) Cardiac Stents negative cardio ROS  + dysrhythmias Atrial Fibrillation + pacemaker + Valvular Problems/Murmurs      Neuro/Psych PSYCHIATRIC DISORDERS Anxiety negative neurological ROS     GI/Hepatic Neg liver ROS, GERD  ,  Endo/Other  diabetes, Well ControlledHypothyroidism   Renal/GU negative Renal ROS  negative genitourinary   Musculoskeletal   Abdominal   Peds  Hematology negative hematology ROS (+)   Anesthesia Other Findings Past Medical History: No date: Anxiety No date: Arthritis     Comment:  "hands" (11/10/2015) No date: CAD s/p CABG x 1     Comment:  a. 04/2015 LIMA to LAD No date: Chronic diastolic CHF (congestive heart failure) (HCC) No date: COPD (chronic obstructive pulmonary disease) (HCC) No date: COPD (chronic obstructive pulmonary disease) (HCC) No date: Coronary artery disease No date: Diabetes mellitus without complication (HCC) No date: GERD (gastroesophageal reflux disease) No date: History of colon polyps No date: Hyperlipidemia No date: Hypertension No date: Hypothyroidism No date: Maze operation for AF w/ LAA clipping     Comment:  a. 04/2015 Complete bilateral atrial lesion set using                cryothermy and bipolar radiofrequency ablation with               clipping of LA appendage No date: Meniere disease No date: Meniere's disease No date: On home oxygen therapy     Comment:  "2L at night" (11/10/2015) No date: PAF (paroxysmal atrial fibrillation) ([place]) ~ 2010: Pneumonia No date: PONV (postoperative nausea and vomiting)     Comment:  Pt felt like eardrums were gonna pop No date: Post-surgical complete heart block, symptomatic     Comment:  a. 04/2015 s/p MDT [id number] Advisa DR MRI DC PPM. No date: Presence of permanent cardiac pacemaker No date: Pulmonary hypertension ([place]) No date: Severe Mitral Regurgitation s/p MV Repair     Comment:  a. 04/2015 s/p 27 mm [facility] Mitral bovine               bioprosthetic tissue valve 11/10/2015: Surgical wound, non healing - chest wall No date: Tricuspid Regurgitation s/p Repair     Comment:  a. 04/2015 s/p 26 mm Edwards mc3 ring annuloplasty 10/13/2015: Wound infection     Comment:  Superficial sternal wound infection   Reproductive/Obstetrics negative OB ROS                            Anesthesia Physical Anesthesia Plan  ASA: IV  Anesthesia Plan: General   Post-op Pain Management:    Induction: Intravenous  PONV Risk Score and Plan: 4 or greater and Propofol infusion  Airway Management  Planned: Nasal Cannula  Additional Equipment:   Intra-op Plan:   Post-operative Plan:   Informed Consent: I have reviewed the patients History and Physical, chart, labs and discussed the procedure including the risks, benefits and alternatives for the proposed anesthesia with the patient or authorized representative who has indicated his/her understanding and acceptance.   Dental Advisory Given  Plan Discussed with: Anesthesiologist, CRNA and Surgeon  Anesthesia Plan Comments:         Anesthesia Quick Evaluation

## 2018-01-20 NOTE — Interval H&P Note (Signed)
History and Physical Interval Note:  01/20/2018 8:11 AM  [name]  has presented today for surgery, with the diagnosis of HEME +  The various methods of treatment have been discussed with the patient and family. After consideration of risks, benefits and other options for treatment, the patient has consented to  Procedure(s): COLONOSCOPY WITH PROPOFOL (N/A) ESOPHAGOGASTRODUODENOSCOPY (EGD) WITH PROPOFOL (N/A) as a surgical intervention .  The patient's history has been reviewed, patient examined, no change in status, stable for surgery.  I have reviewed the patient's chart and labs.  Questions were answered to the patient's satisfaction.     [place], [place]

## 2018-01-20 NOTE — Op Note (Signed)
[facility] Gastroenterology Patient Name: [name] Procedure Date: 01/20/2018 9:47 AM MRN: [id number] Account #: [id number] Date of Birth: 12-16-1934 Admit Type: Outpatient Age: 83 Room: [facility] ENDO ROOM 2 Gender: Female Note Status: Finalized Procedure:            Upper GI endoscopy Indications:          Suspected upper gastrointestinal bleeding in patient                        with unexplained iron deficiency anemia Providers:            Benay Pike. Alice Reichert MD, MD Referring MD:         Christena Flake. Raechel Ache, MD (Referring MD) Medicines:            Propofol per Anesthesia Complications:        No immediate complications. Procedure:            Pre-Anesthesia Assessment:                       - ASA Grade Assessment: III - A patient with severe                        systemic disease.                       - After reviewing the risks and benefits, the patient                        was deemed in satisfactory condition to undergo the                        procedure.                       After obtaining informed consent, the endoscope was                        passed under direct vision. Throughout the procedure,                        the patient's blood pressure, pulse, and oxygen                        saturations were monitored continuously. The Endoscope                        was introduced through the mouth, and advanced to the                        third part of duodenum. The upper GI endoscopy was                        accomplished without difficulty. The patient tolerated                        the procedure well. Findings:      The examined esophagus was normal.      A 1 cm hiatal hernia was found. The proximal extent of the gastric folds       (end of tubular esophagus) was in the distal esophagus.  The examined duodenum was normal. Impression:           - Normal esophagus.                       - 1 cm hiatal hernia.                       - Normal  examined duodenum.                       - No specimens collected. Recommendation:       - Proceed with colonoscopy Procedure Code(s):    --- Professional ---                       [phone], Esophagogastroduodenoscopy, flexible, transoral;                        diagnostic, including collection of specimen(s) by                        brushing or washing, when performed (separate procedure) Diagnosis Code(s):    --- Professional ---                       D50.9, Iron deficiency anemia, unspecified                       K44.9, Diaphragmatic hernia without obstruction or                        gangrene CPT copyright 2017 American Medical Association. All rights reserved. The codes documented in this report are preliminary and upon coder review may  be revised to meet current compliance requirements. Efrain Sella MD, MD 01/20/2018 10:13:05 AM This report has been signed electronically. Number of Addenda: 0 Note Initiated On: 01/20/2018 9:47 AM      [facility]

## 2018-01-20 NOTE — Op Note (Signed)
[facility] Gastroenterology Patient Name: [name] Procedure Date: 01/20/2018 9:45 AM MRN: [id number] Account #: [id number] Date of Birth: [birth year] Admit Type: Outpatient Age: 83 Room: [facility] ENDO ROOM 2 Gender: Female Note Status: Finalized Procedure:            Colonoscopy Indications:          Heme positive stool, Iron deficiency anemia secondary                        to chronic blood loss Providers:            Benay Pike. Alice Reichert MD, MD Referring MD:         Christena Flake. Raechel Ache, MD (Referring MD) Medicines:            Propofol per Anesthesia Complications:        No immediate complications. Procedure:            Pre-Anesthesia Assessment:                       - The risks and benefits of the procedure and the                        sedation options and risks were discussed with the                        patient. All questions were answered and informed                        consent was obtained.                       - Patient identification and proposed procedure were                        verified prior to the procedure by the nurse. The                        procedure was verified in the procedure room.                       - ASA Grade Assessment: III - A patient with severe                        systemic disease.                       - After reviewing the risks and benefits, the patient                        was deemed in satisfactory condition to undergo the                        procedure.                       After obtaining informed consent, the colonoscope was                        passed under direct vision. Throughout the procedure,  the patient's blood pressure, pulse, and oxygen                        saturations were monitored continuously. The                        Colonoscope was introduced through the anus and                        advanced to the the cecum, identified by appendiceal   orifice and ileocecal valve. The colonoscopy was                        performed without difficulty. The patient tolerated the                        procedure well. The quality of the bowel preparation                        was good. Findings:      The perianal and digital rectal examinations were normal. Pertinent       negatives include normal sphincter tone and no palpable rectal lesions.      Multiple small-mouthed diverticula were found in the sigmoid colon.      A 3 mm polyp was found in the rectum. The polyp was sessile. The polyp       was removed with a cold biopsy forceps. Resection and retrieval were       complete.      Non-bleeding internal hemorrhoids were found during retroflexion. The       hemorrhoids were Grade II (internal hemorrhoids that prolapse but reduce       spontaneously).      The exam was otherwise without abnormality. Impression:           - Diverticulosis in the sigmoid colon.                       - One 3 mm polyp in the rectum, removed with a cold                        biopsy forceps. Resected and retrieved.                       - Non-bleeding internal hemorrhoids.                       - The examination was otherwise normal. Recommendation:       - Patient has a contact number available for                        emergencies. The signs and symptoms of potential                        delayed complications were discussed with the patient.                        Return to normal activities tomorrow. Written discharge                        instructions were provided to the patient.                       -  Resume previous diet.                       - Continue present medications.                       - Await pathology results.                       - No repeat colonoscopy due to age.                       - Return to GI office PRN.                       - I would only explore the small bowel if there is                        OVERT gastrointestinal  bleeding/melena. Patient is not                        considered a candidate for wireless capsule endoscopy                        of the small bowel. Follow up with Dr. Raechel Ache, call back                        if we can help.                       - The findings and recommendations were discussed with                        the patient and their spouse. Procedure Code(s):    --- Professional ---                       [phone], Colonoscopy, flexible; with biopsy, single or                        multiple Diagnosis Code(s):    --- Professional ---                       K57.30, Diverticulosis of large intestine without                        perforation or abscess without bleeding                       D50.0, Iron deficiency anemia secondary to blood loss                        (chronic)                       R19.5, Other fecal abnormalities                       K62.1, Rectal polyp                       K64.1, Second degree hemorrhoids CPT copyright 2017 American Medical Association. All rights reserved. The codes documented in this report are preliminary and upon coder  review may  be revised to meet current compliance requirements. Efrain Sella MD, MD 01/20/2018 10:30:58 AM This report has been signed electronically. Number of Addenda: 0 Note Initiated On: 01/20/2018 9:45 AM Scope Withdrawal Time: 0 hours 6 minutes 18 seconds  Total Procedure Duration: 0 hours 10 minutes 1 second       [facility]

## 2018-01-20 NOTE — Anesthesia Post-op Follow-up Note (Signed)
Anesthesia QCDR form completed.        

## 2018-01-20 NOTE — Transfer of Care (Signed)
Immediate Anesthesia Transfer of Care Note  Patient: [name]  Procedure(s) Performed: COLONOSCOPY WITH PROPOFOL (N/A ) ESOPHAGOGASTRODUODENOSCOPY (EGD) WITH PROPOFOL (N/A )  Patient Location: PACU and Endoscopy Unit  Anesthesia Type:General  Level of Consciousness: awake, alert , oriented and patient cooperative  Airway & Oxygen Therapy: Patient Spontanous Breathing  Post-op Assessment: Report given to RN, Post -op Vital signs reviewed and stable and Patient moving all extremities  Post vital signs: Reviewed and stable  Last Vitals:  Vitals Value Taken Time  BP 101/55 01/20/2018 10:29 AM  Temp 36.1 C 01/20/2018 10:20 AM  Pulse 63 01/20/2018 10:32 AM  Resp 23 01/20/2018 10:32 AM  SpO2 95 % 01/20/2018 10:32 AM  Vitals shown include unvalidated device data.  Last Pain:  Vitals:   01/20/18 1020  TempSrc: Tympanic         Complications: No apparent anesthesia complications

## 2018-01-20 NOTE — H&P (Signed)
Outpatient short stay form Pre-procedure 01/20/2018 8:04 AM [name] K. Alice Reichert, M.D.  Primary Physician: Ezequiel Kayser, MD.  Reason for visit: Heme positive stool dark stool, history of adenomatous colon polyps, current therapeutic anticoagulation.  Family history of colon cancer in brother.  History of present illness: 83 year old female with history of colon polyps presents for surveillance as well as for complaint of Hemoccult positive dark stool on iron therapy.  Has history of bioprosthetic valve on Coumadin and this has been held.   No current facility-administered medications for this encounter.   Medications Prior to Admission  Medication Sig Dispense Refill Last Dose  . azelastine (ASTELIN) 0.1 % nasal spray Place 1 spray into both nostrils 2 (two) times daily. Use in each nostril as directed     . bisoprolol-hydrochlorothiazide (ZIAC) 10-6.25 MG tablet Take 1 tablet by mouth daily.     Marland Kitchen spironolactone (ALDACTONE) 25 MG tablet Take 25 mg by mouth daily.     Marland Kitchen acetaminophen (TYLENOL) 325 MG tablet Take 2 tablets (650 mg total) by mouth every 4 (four) hours as needed for mild pain (or Fever >/= 101).   Taking  . AMBULATORY NON FORMULARY MEDICATION Inogen at Home Oxygen machine with tubing. DX: COPD DX Code: J44.9 1 each 0 Taking  . aspirin EC 81 MG EC tablet Take 1 tablet (81 mg total) by mouth daily.   Taking  . benzonatate (TESSALON) 100 MG capsule Take 1 capsule (100 mg total) by mouth 3 (three) times daily as needed for cough. 20 capsule 0 Taking  . bisoprolol (ZEBETA) 10 MG tablet Take 1 tablet (10 mg total) by mouth daily. 30 tablet 5 Taking  . ferrous sulfate 325 (65 FE) MG tablet Take 325 mg by mouth daily with breakfast.   Taking  . fluticasone (FLONASE) 50 MCG/ACT nasal spray Place 1 spray into both nostrils daily as needed for allergies.    Taking  . furosemide (LASIX) 20 MG tablet Take 20 mg by mouth 2 (two) times daily.    Taking  . gabapentin (NEURONTIN) 100 MG capsule  Take 200 mg by mouth at bedtime.   1 Taking  . guaiFENesin-codeine 100-10 MG/5ML syrup Take 5 mLs by mouth every 4 (four) hours as needed for cough. 120 mL 0 Taking  . ipratropium-albuterol (DUONEB) 0.5-2.5 (3) MG/3ML SOLN Take 3 mLs by nebulization every 6 (six) hours as needed. DX:J44.9 1080 mL 1 Taking  . levothyroxine (SYNTHROID, LEVOTHROID) 88 MCG tablet Take 88 mcg by mouth daily before breakfast.   Taking  . losartan (COZAAR) 100 MG tablet Take 1 tablet (100 mg total) by mouth daily. 30 tablet 3 Taking  . Multiple Vitamins-Minerals (PRESERVISION AREDS 2 PO) Take 2 tablets by mouth daily.   Taking  . pantoprazole (PROTONIX) 40 MG tablet Take 40 mg by mouth 2 (two) times daily.    Taking  . potassium chloride SA (K-DUR,KLOR-CON) 20 MEQ tablet Take 20 mEq by mouth daily.  2 Taking  . simvastatin (ZOCOR) 20 MG tablet Take 20 mg by mouth at bedtime.    Taking  . warfarin (COUMADIN) 2.5 MG tablet TAKE AS DIRECTED BY COUMADIN CLINIC 55 tablet 2   . warfarin (COUMADIN) 2.5 MG tablet TAKE AS DIRECTED BY COUMADIN CLINIC 55 tablet 3      Allergies  Allergen Reactions  . Ace Inhibitors Cough  . Augmentin [Amoxicillin-Pot Clavulanate] Swelling    JOINTS, PAIN  . Penicillins Swelling    SWOLLEN JOINTS  Has patient had a PCN reaction  causing immediate rash, facial/tongue/throat swelling, SOB or lightheadedness with hypotension: Yes Has patient had a PCN reaction causing severe rash involving mucus membranes or skin necrosis: No Has patient had a PCN reaction that required hospitalization No Has patient had a PCN reaction occurring within the last 10 years: No If all of the above answers are "NO", then may proceed with Cephalosporin use.   . Nifedipine Other (See Comments)    UNSPECIFIED  . Propranolol Other (See Comments)    UNSPECIFIED  . Diazepam Other (See Comments)    Made her "hyper"  . Meperidine Other (See Comments)    Made her "hyper"  . Propoxyphene Other (See Comments)    Made  her "hyper"     Past Medical History:  Diagnosis Date  . Anxiety   . Arthritis    "hands" (11/10/2015)  . CAD s/p CABG x 1    a. 04/2015 LIMA to LAD  . Chronic diastolic CHF (congestive heart failure) ([place])   . COPD (chronic obstructive pulmonary disease) ([place])   . COPD (chronic obstructive pulmonary disease) ([place])   . Coronary artery disease   . Diabetes mellitus without complication ([place])   . GERD (gastroesophageal reflux disease)   . History of colon polyps   . Hyperlipidemia   . Hypertension   . Hypothyroidism   . 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 04/2015 Complete bilateral atrial lesion set using cryothermy and bipolar radiofrequency ablation with clipping of LA appendage  . Meniere disease   . Meniere's disease   . On home oxygen therapy    "2L at night" (11/10/2015)  . PAF (paroxysmal atrial fibrillation) ([place])   . Pneumonia ~ 2010  . PONV (postoperative nausea and vomiting)    Pt felt like eardrums were gonna pop  . Post-surgical complete heart block, symptomatic    a. 04/2015 s/p MDT [id number] Advisa DR MRI DC PPM.  . Presence of permanent cardiac pacemaker   . Pulmonary hypertension ([place])   . Severe Mitral Regurgitation s/p MV Repair    a. 04/2015 s/p 27 mm [facility] Mitral bovine bioprosthetic tissue valve  . Surgical wound, non healing - chest wall 11/10/2015  . Tricuspid Regurgitation s/p Repair    a. 04/2015 s/p 26 mm Edwards mc3 ring annuloplasty  . Wound infection 10/13/2015   Superficial sternal wound infection    Review of systems:   Otherwise negative   Physical Exam  Gen: Alert, oriented. Appears stated age.  HEENT: [place]/AT. PERRLA. Lungs: CTA, no wheezes. CV: RR nl S1, S2. Abd: soft, benign, no masses. BS+ Ext: No edema. Pulses 2+    Planned procedures: Proceed with EGD and colonoscopy.The patient understands the nature of the planned procedure, indications, risks, alternatives and potential complications including but not limited to  bleeding, infection, perforation, damage to internal organs and possible oversedation/side effects from anesthesia. The patient agrees and gives consent to proceed.  Please refer to procedure notes for findings, recommendations and patient disposition/instructions.    [name] K. Alice Reichert, M.D. Gastroenterology 01/20/2018  8:04 AM

## 2018-01-21 ENCOUNTER — Encounter: Payer: Self-pay | Admitting: Internal Medicine

## 2018-01-21 LAB — SURGICAL PATHOLOGY

## 2018-01-23 DIAGNOSIS — M810 Age-related osteoporosis without current pathological fracture: Secondary | ICD-10-CM | POA: Insufficient documentation

## 2018-01-27 ENCOUNTER — Ambulatory Visit
Admission: RE | Admit: 2018-01-27 | Discharge: 2018-01-27 | Disposition: A | Discharge status: Home / Self Care | Payer: Medicare Other | Source: Ambulatory Visit | Attending: Internal Medicine | Admitting: Internal Medicine

## 2018-01-27 DIAGNOSIS — Z1231 Encounter for screening mammogram for malignant neoplasm of breast: Secondary | ICD-10-CM | POA: Diagnosis not present

## 2018-01-28 ENCOUNTER — Ambulatory Visit (INDEPENDENT_AMBULATORY_CARE_PROVIDER_SITE_OTHER): Payer: Medicare Other

## 2018-01-28 DIAGNOSIS — I340 Nonrheumatic mitral (valve) insufficiency: Secondary | ICD-10-CM

## 2018-01-28 DIAGNOSIS — Z5181 Encounter for therapeutic drug level monitoring: Secondary | ICD-10-CM | POA: Diagnosis not present

## 2018-01-28 DIAGNOSIS — Z953 Presence of xenogenic heart valve: Secondary | ICD-10-CM | POA: Diagnosis not present

## 2018-01-28 LAB — POCT INR: INR: 1.7 — AB (ref 2.0–3.0)

## 2018-01-28 NOTE — Patient Instructions (Signed)
Please take 2 tablets today and tomorrow, then resume previous dosage of 1.5 tablets every day except 1 tablet on Mondays and Fridays.  Recheck in 2 weeks.

## 2018-02-11 ENCOUNTER — Ambulatory Visit (INDEPENDENT_AMBULATORY_CARE_PROVIDER_SITE_OTHER): Payer: Medicare Other

## 2018-02-11 DIAGNOSIS — Z953 Presence of xenogenic heart valve: Secondary | ICD-10-CM

## 2018-02-11 DIAGNOSIS — Z5181 Encounter for therapeutic drug level monitoring: Secondary | ICD-10-CM | POA: Diagnosis not present

## 2018-02-11 DIAGNOSIS — I340 Nonrheumatic mitral (valve) insufficiency: Secondary | ICD-10-CM

## 2018-02-11 LAB — POCT INR: INR: 1.8 — AB (ref 2.0–3.0)

## 2018-02-11 NOTE — Patient Instructions (Signed)
Please take 2 tablets today and then START NEW DOSAGE of 1.5 tablets every day.   Recheck in 2 weeks.

## 2018-02-25 ENCOUNTER — Ambulatory Visit (INDEPENDENT_AMBULATORY_CARE_PROVIDER_SITE_OTHER): Payer: Medicare Other

## 2018-02-25 DIAGNOSIS — Z953 Presence of xenogenic heart valve: Secondary | ICD-10-CM

## 2018-02-25 DIAGNOSIS — Z5181 Encounter for therapeutic drug level monitoring: Secondary | ICD-10-CM | POA: Diagnosis not present

## 2018-02-25 DIAGNOSIS — I340 Nonrheumatic mitral (valve) insufficiency: Secondary | ICD-10-CM

## 2018-02-25 LAB — POCT INR: INR: 2.1 (ref 2.0–3.0)

## 2018-02-25 NOTE — Patient Instructions (Signed)
Please continue of 1.5 tablets every day.   Recheck in 3 weeks.

## 2018-03-11 ENCOUNTER — Ambulatory Visit (INDEPENDENT_AMBULATORY_CARE_PROVIDER_SITE_OTHER): Payer: Medicare Other | Admitting: *Deleted

## 2018-03-11 ENCOUNTER — Telehealth: Payer: Self-pay | Admitting: Cardiology

## 2018-03-11 DIAGNOSIS — I442 Atrioventricular block, complete: Secondary | ICD-10-CM

## 2018-03-11 NOTE — Telephone Encounter (Signed)
Spoke with pt and reminded pt of remote transmission that is due today. Pt verbalized understanding.   

## 2018-03-13 ENCOUNTER — Encounter: Payer: Self-pay | Admitting: Cardiology

## 2018-03-13 NOTE — Progress Notes (Signed)
Remote pacemaker transmission.   

## 2018-03-25 ENCOUNTER — Ambulatory Visit (INDEPENDENT_AMBULATORY_CARE_PROVIDER_SITE_OTHER): Payer: Medicare Other

## 2018-03-25 DIAGNOSIS — Z5181 Encounter for therapeutic drug level monitoring: Secondary | ICD-10-CM | POA: Diagnosis not present

## 2018-03-25 DIAGNOSIS — I340 Nonrheumatic mitral (valve) insufficiency: Secondary | ICD-10-CM

## 2018-03-25 DIAGNOSIS — Z953 Presence of xenogenic heart valve: Secondary | ICD-10-CM

## 2018-03-25 LAB — POCT INR: INR: 2 (ref 2.0–3.0)

## 2018-03-25 NOTE — Patient Instructions (Signed)
Please continue of 1.5 tablets every day.   Recheck in 4 weeks.

## 2018-04-01 LAB — CUP PACEART REMOTE DEVICE CHECK
Brady Statistic AP VP Percent: 0.14 %
Brady Statistic AS VP Percent: 0.01 %
Brady Statistic RA Percent Paced: 87.61 %
Date Time Interrogation Session: 20190703174722
Implantable Lead Implant Date: 20160823
Implantable Lead Location: 753860
Implantable Lead Model: 5076
Implantable Lead Model: 5076
Lead Channel Impedance Value: 399 Ohm
Lead Channel Impedance Value: 437 Ohm
Lead Channel Pacing Threshold Pulse Width: 0.4 ms
Lead Channel Sensing Intrinsic Amplitude: 1.875 mV
Lead Channel Sensing Intrinsic Amplitude: 10.5 mV
Lead Channel Setting Pacing Amplitude: 2 V
Lead Channel Setting Pacing Pulse Width: 0.4 ms
MDC IDC LEAD IMPLANT DT: 20160823
MDC IDC LEAD LOCATION: 753859
MDC IDC MSMT BATTERY REMAINING LONGEVITY: 90 mo
MDC IDC MSMT BATTERY VOLTAGE: 3.02 V
MDC IDC MSMT LEADCHNL RA IMPEDANCE VALUE: 361 Ohm
MDC IDC MSMT LEADCHNL RA IMPEDANCE VALUE: 456 Ohm
MDC IDC MSMT LEADCHNL RA PACING THRESHOLD AMPLITUDE: 0.375 V
MDC IDC MSMT LEADCHNL RA SENSING INTR AMPL: 1.875 mV
MDC IDC MSMT LEADCHNL RV PACING THRESHOLD AMPLITUDE: 0.5 V
MDC IDC MSMT LEADCHNL RV PACING THRESHOLD PULSEWIDTH: 0.4 ms
MDC IDC MSMT LEADCHNL RV SENSING INTR AMPL: 10.5 mV
MDC IDC PG IMPLANT DT: 20160823
MDC IDC SET LEADCHNL RV PACING AMPLITUDE: 2.5 V
MDC IDC SET LEADCHNL RV SENSING SENSITIVITY: 2.8 mV
MDC IDC STAT BRADY AP VS PERCENT: 88.65 %
MDC IDC STAT BRADY AS VS PERCENT: 11.21 %
MDC IDC STAT BRADY RV PERCENT PACED: 0.19 %

## 2018-04-08 ENCOUNTER — Telehealth: Payer: Self-pay | Admitting: Cardiovascular Disease

## 2018-04-08 NOTE — Telephone Encounter (Signed)
Patient calling and will be coming to see Dr. Fletcher Anon on 8/16 but has a coumadin appointment on 8/14 Would like to know if it is possible for nurse to do coumadin at doctor appointment rather then coming in twice  Please call to discuss

## 2018-04-09 NOTE — Telephone Encounter (Signed)
[name],  Are you ok with [place] checking her on 8/16, or is there any reason you need to see her on 8/14?  Thanks!

## 2018-04-10 NOTE — Telephone Encounter (Signed)
Appt moved to same day as physician visit.

## 2018-04-10 NOTE — Telephone Encounter (Signed)
The patient is aware we will check her INR the same day of her office visit with Dr. Fletcher Anon on 04/24/18.

## 2018-04-24 ENCOUNTER — Ambulatory Visit (INDEPENDENT_AMBULATORY_CARE_PROVIDER_SITE_OTHER): Payer: Medicare Other | Admitting: Cardiovascular Disease

## 2018-04-24 ENCOUNTER — Ambulatory Visit (INDEPENDENT_AMBULATORY_CARE_PROVIDER_SITE_OTHER): Payer: Medicare Other | Admitting: Pharmacist

## 2018-04-24 ENCOUNTER — Encounter: Payer: Self-pay | Admitting: Cardiovascular Disease

## 2018-04-24 VITALS — BP 132/70 | HR 68 | Ht 62.0 in | Wt 131.0 lb

## 2018-04-24 DIAGNOSIS — I2510 Atherosclerotic heart disease of native coronary artery without angina pectoris: Secondary | ICD-10-CM

## 2018-04-24 DIAGNOSIS — I481 Persistent atrial fibrillation: Secondary | ICD-10-CM

## 2018-04-24 DIAGNOSIS — Z5181 Encounter for therapeutic drug level monitoring: Secondary | ICD-10-CM

## 2018-04-24 DIAGNOSIS — I5022 Chronic systolic (congestive) heart failure: Secondary | ICD-10-CM

## 2018-04-24 DIAGNOSIS — Z953 Presence of xenogenic heart valve: Secondary | ICD-10-CM | POA: Diagnosis not present

## 2018-04-24 DIAGNOSIS — E785 Hyperlipidemia, unspecified: Secondary | ICD-10-CM | POA: Diagnosis not present

## 2018-04-24 DIAGNOSIS — I4819 Other persistent atrial fibrillation: Secondary | ICD-10-CM

## 2018-04-24 LAB — POCT INR: INR: 1.9 — AB (ref 2.0–3.0)

## 2018-04-24 NOTE — Patient Instructions (Signed)
Medication Instructions: Your physician recommends that you continue on your current medications as directed. Please refer to the Current Medication list given to you today.  If you need a refill on your cardiac medications before your next appointment, please call your pharmacy.   Follow-Up: Your physician wants you to follow-up in 6 months with Dr. Arida. You will receive a reminder letter in the mail two months in advance. If you don't receive a letter, please call our office at [phone] to schedule this follow-up appointment.  Thank you for choosing [facility] [place]!    

## 2018-04-24 NOTE — Progress Notes (Signed)
Cardiology Office Note   Date:  04/24/2018   ID:  [name], DOB 06-24-1935, MRN [id number]  PCP:  Ezequiel Kayser, MD  Cardiologist:   Kathlyn Sacramento, MD   Chief Complaint  Patient presents with  . OTHER    6 month f/u c/o fatigue.Meds reviewed verbally with pt.      History of Present Illness: [name] is a 83 y.o. female who presents for  a follow-up visit regarding chronic systolic heart failure, atrial fibrillation and mitral regurgitation.  She has known history of COPD, breast cancer status post right partial mastectomy, hypothyroidism, hyperlipidemia and Mnire disease.   She underwent mitral valve replacement with a bioprosthetic valve, tricuspid valve repair, one-vessel CABG with LIMA to LAD and maze procedure in August 2016. This was complicated by sternal wound infection which required debridement. She also had complete heart block and underwent permanent pacemaker placement. She initially had worsening Ejection fraction after surgery but most recent echocardiogram in January 2018 showed an EF of 50-55% with intact mitral valve and normal pulmonary pressure.  I discontinued spironolactone during last visit due to low blood pressure and improvement in EF.  She has been doing well with no recent chest pain.  Reports stable exertional dyspnea.  She continues to complain of generalized fatigue.  She takes her medications regularly.  Past Medical History:  Diagnosis Date  . Anxiety   . Arthritis    "hands" (11/10/2015)  . CAD s/p CABG x 1    a. 04/2015 LIMA to LAD  . Chronic diastolic CHF (congestive heart failure) ([place])   . COPD (chronic obstructive pulmonary disease) ([place])   . COPD (chronic obstructive pulmonary disease) ([place])   . Coronary artery disease   . GERD (gastroesophageal reflux disease)   . History of colon polyps   . Hyperlipidemia   . Hypertension   . Hypothyroidism   . 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 04/2015 Complete bilateral atrial  lesion set using cryothermy and bipolar radiofrequency ablation with clipping of LA appendage  . Meniere disease   . Meniere's disease   . On home oxygen therapy    "2L at night" (11/10/2015)  . PAF (paroxysmal atrial fibrillation) ([place])   . Pneumonia ~ 2010  . PONV (postoperative nausea and vomiting)    Pt felt like eardrums were gonna pop  . Post-surgical complete heart block, symptomatic    a. 04/2015 s/p MDT [id number] [place]visa DR MRI DC PPM.  . Presence of permanent cardiac pacemaker   . Pulmonary hypertension ([place])   . Severe Mitral Regurgitation s/p MV Repair    a. 04/2015 s/p 27 mm [facility] Mitral bovine bioprosthetic tissue valve  . Surgical wound, non healing - chest wall 11/10/2015  . Tricuspid Regurgitation s/p Repair    a. 04/2015 s/p 26 mm Edwards mc3 ring annuloplasty  . Wound infection 10/13/2015   Superficial sternal wound infection    Past Surgical History:  Procedure Laterality Date  . APPENDECTOMY    . APPLICATION OF A-CELL OF CHEST/ABDOMEN N/A 11/21/2015   Procedure: APPLICATION OF A-CELL OF CHEST/ABDOMEN;  Surgeon: Loel Lofty Dillingham, DO;  Location: [place];  Service: Plastics;  Laterality: N/A;  . APPLICATION OF A-CELL OF CHEST/ABDOMEN Right 12/21/2015   Procedure: APPLICATION OF A-CELL OF RIGHT CHEST;  Surgeon: Loel Lofty Dillingham, DO;  Location: [place];  Service: Plastics;  Laterality: Right;  . APPLICATION OF A-CELL OF CHEST/ABDOMEN Right 01/11/2016   Procedure: APPLICATION  OF A-CELL OF RIGHT CHEST;  Surgeon: Loel Lofty Dillingham, DO;  Location: [place];  Service: Plastics;  Laterality: Right;  . APPLICATION OF A-CELL OF CHEST/ABDOMEN Right 02/12/2016   Procedure: APPLICATION OF A-CELL TO RIGHT CHEST WOUND;  Surgeon: Loel Lofty Dillingham, DO;  Location: [place];  Service: Plastics;  Laterality: Right;  . APPLICATION OF WOUND VAC N/A 05/09/2015   Procedure: APPLICATION OF WOUND VAC;  Surgeon: Rexene Alberts, MD;  Location: [place];  Service: Thoracic;  Laterality: N/A;  .  APPLICATION OF WOUND VAC N/A 11/13/2015   Procedure: APPLICATION OF WOUND VAC;  Surgeon: Rexene Alberts, MD;  Location: [place];  Service: Thoracic;  Laterality: N/A;  . APPLICATION OF WOUND VAC N/A 11/21/2015   Procedure: APPLICATION OF WOUND VAC;  Surgeon: Loel Lofty Dillingham, DO;  Location: [place];  Service: Plastics;  Laterality: N/A;  . APPLICATION OF WOUND VAC Right 12/21/2015   Procedure: APPLICATION OF WOUND VAC to right chest wall ;  Surgeon: Loel Lofty Dillingham, DO;  Location: [place];  Service: Plastics;  Laterality: Right;  . APPLICATION OF WOUND VAC Right 01/11/2016   Procedure: APPLICATION OF WOUND VAC RIGHT CHEST ;  Surgeon: Loel Lofty Dillingham, DO;  Location: [place];  Service: Plastics;  Laterality: Right;  . APPLICATION OF WOUND VAC Right 01/24/2016   Procedure: APPLICATION OF WOUND VAC RIGHT CHEST WALL;  Surgeon: Loel Lofty Dillingham, DO;  Location: [place];  Service: Plastics;  Laterality: Right;  . APPLICATION OF WOUND VAC Right 02/12/2016   Procedure: RE-APPLICATION OF WOUND VAC TO RIGHT CHEST WOUND;  Surgeon: Loel Lofty Dillingham, DO;  Location: [place];  Service: Plastics;  Laterality: Right;  . BREAST BIOPSY Left 11/25/06   neg  . BREAST BIOPSY Left 01/20/12   /clip-neg  . CARDIAC CATHETERIZATION  11/2013   [facility]  . CARDIAC CATHETERIZATION  10/2014   [facility]  . CARDIAC VALVE REPLACEMENT    . CATARACT EXTRACTION W/ INTRAOCULAR LENS  IMPLANT, BILATERAL Bilateral 2014  . CLIPPING OF ATRIAL APPENDAGE N/A 04/25/2015   Procedure: CLIPPING OF ATRIAL APPENDAGE;  Surgeon: Rexene Alberts, MD;  Location: [place];  Service: Open Heart Surgery;  Laterality: N/A;  . COCHLEAR IMPLANT Left 2005?  . COLONOSCOPY WITH PROPOFOL N/A 01/20/2018   Procedure: COLONOSCOPY WITH PROPOFOL;  Surgeon: Toledo, Benay Pike, MD;  Location: [facility];  Service: Gastroenterology;  Laterality: N/A;  . CORONARY ANGIOPLASTY    . CORONARY ARTERY BYPASS GRAFT N/A 04/25/2015   Procedure: CORONARY ARTERY BYPASS GRAFTING (CABG) x ONE,  using left internal mammary artery;  Surgeon: Rexene Alberts, MD;  Location: [place];  Service: Open Heart Surgery;  Laterality: N/A;  . DILATION AND CURETTAGE OF UTERUS  "several before hysterectomy"  . EP IMPLANTABLE DEVICE N/A 05/02/2015   Procedure: Pacemaker Implant;  Surgeon: Thompson Grayer, MD;  Location: [place] CV [facility];  Service: Cardiovascular;  Laterality: N/A;  . ESOPHAGOGASTRODUODENOSCOPY (EGD) WITH PROPOFOL N/A 01/20/2018   Procedure: ESOPHAGOGASTRODUODENOSCOPY (EGD) WITH PROPOFOL;  Surgeon: Toledo, Benay Pike, MD;  Location: [facility];  Service: Gastroenterology;  Laterality: N/A;  . EYE SURGERY    . I&D EXTREMITY Right 12/06/2015   Procedure: IRRIGATION AND DEBRIDEMENT RIGHT CHEST WALL WITH ACELL PLACEMENT AND VAC;  Surgeon: Loel Lofty Dillingham, DO;  Location: [place];  Service: Plastics;  Laterality: Right;  . INCISION AND DRAINAGE OF WOUND N/A 11/21/2015   Procedure: IRRIGATION AND DEBRIDEMENT WOUND;  Surgeon: Loel Lofty Dillingham, DO;  Location: [place];  Service: Clinical cytogeneticist;  Laterality: N/A;  . INCISION AND DRAINAGE OF WOUND Right 12/21/2015   Procedure: IRRIGATION AND DEBRIDEMENT right chest wall WOUND;  Surgeon: Loel Lofty Dillingham, DO;  Location: [place];  Service: Plastics;  Laterality: Right;  right chest wall   . INCISION AND DRAINAGE OF WOUND Right 01/24/2016   Procedure: IRRIGATION AND DEBRIDEMENT RIGHT CHEST WALL WOUND;  Surgeon: Loel Lofty Dillingham, DO;  Location: [place];  Service: Plastics;  Laterality: Right;  . INCISION AND DRAINAGE OF WOUND Right 03/28/2016   Procedure: IRRIGATION AND DEBRIDEMENT RIGHT CHEST WALL WOUND WITH A Cell Placement;  Surgeon: Loel Lofty Dillingham, DO;  Location: [place];  Service: Plastics;  Laterality: Right;  . INCISION AND DRAINAGE OF WOUND Right 05/17/2016   Procedure: IRRIGATION AND DEBRIDEMENT OF RIGHT CHEST WOUND WITH A CELL PLACEMENT;  Surgeon: Loel Lofty Dillingham, DO;  Location: [place];  Service: Plastics;  Laterality: Right;  . INSERT / REPLACE /  REMOVE PACEMAKER    . IRRIGATION AND DEBRIDEMENT OF WOUND WITH SPLIT THICKNESS SKIN GRAFT Right 01/11/2016   Procedure: IRRIGATION AND DEBRIDEMENT OF RIGHT CHEST WOUND ;  Surgeon: Loel Lofty Dillingham, DO;  Location: [place];  Service: Plastics;  Laterality: Right;  . MASTECTOMY, PARTIAL Right 2002   positive  . MAZE N/A 04/25/2015   Procedure: MAZE;  Surgeon: Rexene Alberts, MD;  Location: [place];  Service: Open Heart Surgery;  Laterality: N/A;  . MITRAL VALVE REPAIR N/A 04/25/2015   Procedure: MITRAL VALVE  REPLACEMENT using a 27 mm Edwards Perimount Magna Mitral Ease Valve;  Surgeon: Rexene Alberts, MD;  Location: [place];  Service: Open Heart Surgery;  Laterality: N/A;  . SKIN SPLIT GRAFT Right 02/12/2016   Procedure: IRRIGATION AND DEBRIDEMENT RIGHT CHEST WOUND;  Surgeon: Loel Lofty Dillingham, DO;  Location: [place];  Service: Plastics;  Laterality: Right;  . STERNAL WIRES REMOVAL N/A 06/05/2015   Procedure: STERNAL WIRES REMOVAL;  Surgeon: Rexene Alberts, MD;  Location: [place];  Service: Thoracic;  Laterality: N/A;  . STERNAL WOUND DEBRIDEMENT N/A 05/09/2015   Procedure: STERNAL WOUND DEBRIDEMENT;  Surgeon: Rexene Alberts, MD;  Location: [place];  Service: Thoracic;  Laterality: N/A;  . STERNAL WOUND DEBRIDEMENT N/A 11/13/2015   Procedure: Excisional drainage of RIGHT Chest wall mass and breast mass ;  Surgeon: Rexene Alberts, MD;  Location: [place];  Service: Thoracic;  Laterality: N/A;  . TEE WITHOUT CARDIOVERSION N/A 04/25/2015   Procedure: TRANSESOPHAGEAL ECHOCARDIOGRAM (TEE);  Surgeon: Rexene Alberts, MD;  Location: [place];  Service: Open Heart Surgery;  Laterality: N/A;  . TONSILLECTOMY    . TRAM Right 11/18/2015   Procedure: VRAM Vertical Rectus Abdominus Muscle Flap;  Surgeon: Loel Lofty Dillingham, DO;  Location: [place];  Service: Plastics;  Laterality: Right;  RIght Back  . TRICUSPID VALVE REPLACEMENT N/A 04/25/2015   Procedure: TRICUSPID VALVE REPAIR;  Surgeon: Rexene Alberts, MD;  Location: [place];   Service: Open Heart Surgery;  Laterality: N/A;  . VAGINAL HYSTERECTOMY       Current Outpatient Medications  Medication Sig Dispense Refill  . acetaminophen (TYLENOL) 325 MG tablet Take 2 tablets (650 mg total) by mouth every 4 (four) hours as needed for mild pain (or Fever >/= 101).    . AMBULATORY NON FORMULARY MEDICATION Inogen at Home Oxygen machine with tubing. DX: COPD DX Code: J44.9 1 each 0  . aspirin EC 81 MG EC tablet Take 1 tablet (81 mg total) by  mouth daily.    Marland Kitchen azelastine (ASTELIN) 0.1 % nasal spray Place 1 spray into both nostrils 2 (two) times daily. Use in each nostril as directed    . benzonatate (TESSALON) 100 MG capsule Take 1 capsule (100 mg total) by mouth 3 (three) times daily as needed for cough. 20 capsule 0  . bisoprolol (ZEBETA) 10 MG tablet Take 1 tablet (10 mg total) by mouth daily. 30 tablet 5  . bisoprolol-hydrochlorothiazide (ZIAC) 10-6.25 MG tablet Take 1 tablet by mouth daily.    . ferrous sulfate 325 (65 FE) MG tablet Take 325 mg by mouth daily with breakfast.    . fluticasone (FLONASE) 50 MCG/ACT nasal spray Place 1 spray into both nostrils daily as needed for allergies.     . furosemide (LASIX) 20 MG tablet Take 20 mg by mouth 2 (two) times daily.     Marland Kitchen gabapentin (NEURONTIN) 100 MG capsule Take 200 mg by mouth at bedtime.   1  . guaiFENesin-codeine 100-10 MG/5ML syrup Take 5 mLs by mouth every 4 (four) hours as needed for cough. 120 mL 0  . ipratropium-albuterol (DUONEB) 0.5-2.5 (3) MG/3ML SOLN Take 3 mLs by nebulization every 6 (six) hours as needed. DX:J44.9 1080 mL 1  . levothyroxine (SYNTHROID, LEVOTHROID) 88 MCG tablet Take 88 mcg by mouth daily before breakfast.    . losartan (COZAAR) 100 MG tablet Take 1 tablet (100 mg total) by mouth daily. 30 tablet 3  . Multiple Vitamins-Minerals (PRESERVISION AREDS 2 PO) Take 2 tablets by mouth daily.    . pantoprazole (PROTONIX) 40 MG tablet Take 40 mg by mouth 2 (two) times daily.     . potassium chloride SA  (K-DUR,KLOR-CON) 20 MEQ tablet Take 20 mEq by mouth daily.  2  . simvastatin (ZOCOR) 20 MG tablet Take 20 mg by mouth at bedtime.     Marland Kitchen warfarin (COUMADIN) 2.5 MG tablet TAKE AS DIRECTED BY COUMADIN CLINIC 55 tablet 2  . warfarin (COUMADIN) 2.5 MG tablet TAKE AS DIRECTED BY COUMADIN CLINIC 55 tablet 3   No current facility-administered medications for this visit.     Allergies:   Ace inhibitors; Augmentin [amoxicillin-pot clavulanate]; Penicillins; Nifedipine; Propranolol; Diazepam; Meperidine; and Propoxyphene    Social History:  The patient  reports that she quit smoking about 20 years ago. Her smoking use included cigarettes. She has a 60.00 pack-year smoking history. She has never used smokeless tobacco. She reports that she drinks alcohol. She reports that she does not use drugs.   Family History:  The patient's family history includes Breast cancer in her maternal aunt and paternal aunt; Heart attack in her paternal uncle; Heart disease in her brother and father.    ROS:  Please see the history of present illness.   Otherwise, review of systems are positive for none.   All other systems are reviewed and negative.    PHYSICAL EXAM: VS:  BP 132/70 (BP Location: Left Arm, Patient Position: Sitting, Cuff Size: Normal)   Pulse 68   Ht 5\' 2"  (1.575 m)   Wt 131 lb (59.4 kg)   BMI 23.96 kg/m  , BMI Body mass index is 23.96 kg/m. GEN: Well nourished, well developed, in no acute distress  HEENT: normal  Neck: no JVD, carotid bruits, or masses Cardiac: RRR; no murmurs, rubs, or gallops,no edema  Respiratory:  clear to auscultation bilaterally with mildly diminished breath sounds, normal work of breathing GI: soft, nontender, nondistended, + BS MS: no deformity or atrophy  Skin:  warm and dry, no rash Neuro:  Strength and sensation are intact Psych: euthymic mood, full affect   EKG:  EKG is ordered today. The ekg ordered today demonstrates atrial paced rhythm , pulmonary disease  pattern.  Nonspecific ST changes.  Recent Labs: No results found for requested labs within last 8760 hours.    Lipid Panel No results found for: CHOL, TRIG, HDL, CHOLHDL, VLDL, LDLCALC, LDLDIRECT    Wt Readings from Last 3 Encounters:  04/24/18 131 lb (59.4 kg)  11/05/17 133 lb (60.3 kg)  10/23/17 131 lb 12 oz (59.8 kg)       ASSESSMENT AND PLAN:  1.  Chronic systolic heart failure: She is doing reasonably well and appears to be euvolemic.  Most recent ejection fraction showed an EF of 50-55%.  I reviewed her labs done in May which showed normal kidney function.  Continue same medications.  2. Status post mitral valve replacement with a bioprosthetic valve: This was intact on most recent echocardiogram.  3. Coronary artery disease status post one-vessel LIMA to LAD: No anginal symptoms  4. Persistent atrial fibrillation: No evidence of recurrent atrial fibrillation since her surgery even after stopping amiodarone.  Currently on anticoagulation with warfarin which is managed by our coagulation clinic  5.  Hyperlipidemia: Currently on simvastatin.  6.  Status post permanent pacemaker placement: Followed by Dr. Caryl Comes.  Disposition:   FU with me in 6 months  Signed,  Kathlyn Sacramento, MD  04/24/2018 9:47 AM    [place]

## 2018-04-24 NOTE — Patient Instructions (Signed)
Description   Take 2 tablets today, then continue of 1.5 tablets every day.   Recheck in 3 weeks.

## 2018-05-12 ENCOUNTER — Telehealth: Payer: Self-pay | Admitting: Internal Medicine

## 2018-05-12 DIAGNOSIS — I472 Ventricular tachycardia: Secondary | ICD-10-CM

## 2018-05-12 DIAGNOSIS — I4729 Other ventricular tachycardia: Secondary | ICD-10-CM

## 2018-05-12 NOTE — Telephone Encounter (Signed)
Notes recorded by Emily Filbert, RN on 05/12/2018 at 3:11 PM EDT Clarified with Dr. Caryl Comes- he would like the patient to have her magnesium level checked due to NSVT. I have called and notified the patient. She is scheduled for a coumadin check in the office tomorrow.  The patient is agreeable with a magnesium check tomorrow as well. ------  Notes recorded by Deboraha Sprang, MD on 04/29/2018 at 6:02 PM EDT NO thnxk ------  Notes recorded by Deboraha Sprang, MD on 04/28/2018 at 9:23 AM EDT Remote reviewed. This remote is abnormal for VT nonsustained  K was normal in may Mg not checked in 2 yers  VT NS has liitle implication if LV function still normal

## 2018-05-13 ENCOUNTER — Other Ambulatory Visit (INDEPENDENT_AMBULATORY_CARE_PROVIDER_SITE_OTHER): Payer: Medicare Other

## 2018-05-13 ENCOUNTER — Ambulatory Visit (INDEPENDENT_AMBULATORY_CARE_PROVIDER_SITE_OTHER): Payer: Medicare Other

## 2018-05-13 DIAGNOSIS — I472 Ventricular tachycardia: Secondary | ICD-10-CM | POA: Diagnosis not present

## 2018-05-13 DIAGNOSIS — I340 Nonrheumatic mitral (valve) insufficiency: Secondary | ICD-10-CM

## 2018-05-13 DIAGNOSIS — Z5181 Encounter for therapeutic drug level monitoring: Secondary | ICD-10-CM | POA: Diagnosis not present

## 2018-05-13 DIAGNOSIS — Z953 Presence of xenogenic heart valve: Secondary | ICD-10-CM

## 2018-05-13 DIAGNOSIS — I4729 Other ventricular tachycardia: Secondary | ICD-10-CM

## 2018-05-13 LAB — POCT INR: INR: 2 (ref 2.0–3.0)

## 2018-05-13 NOTE — Patient Instructions (Signed)
Please START NEW DOSAGE of 1.5 tablets every day EXCEPT 2 TABLETS ON MONDAYS.  Recheck in 3 weeks.

## 2018-05-14 LAB — MAGNESIUM: Magnesium: 2.2 mg/dL (ref 1.6–2.3)

## 2018-05-29 ENCOUNTER — Telehealth: Payer: Self-pay | Admitting: Internal Medicine

## 2018-05-29 NOTE — Telephone Encounter (Signed)
I called and spoke with the patient. I advised her we will check her INR when she is here seeing Dr. Caryl Comes on 06/02/18. The patient is aware and agreeable.

## 2018-05-29 NOTE — Telephone Encounter (Signed)
Patient calling States she is seeing Dr. Caryl Comes on 9/24 and has coumadin on 0/76 There is a conflicting appointment on 9/25 and she is asking if it will be possible to have coumadin completed by a nurse on her Tuesday visit Please call to discuss

## 2018-06-02 ENCOUNTER — Encounter: Payer: Self-pay | Admitting: Internal Medicine

## 2018-06-02 ENCOUNTER — Ambulatory Visit (INDEPENDENT_AMBULATORY_CARE_PROVIDER_SITE_OTHER): Payer: Medicare Other | Admitting: Internal Medicine

## 2018-06-02 ENCOUNTER — Ambulatory Visit (INDEPENDENT_AMBULATORY_CARE_PROVIDER_SITE_OTHER): Payer: Medicare Other

## 2018-06-02 VITALS — BP 153/63 | HR 63 | Ht 61.0 in | Wt 130.5 lb

## 2018-06-02 DIAGNOSIS — I480 Paroxysmal atrial fibrillation: Secondary | ICD-10-CM | POA: Diagnosis not present

## 2018-06-02 DIAGNOSIS — D539 Nutritional anemia, unspecified: Secondary | ICD-10-CM

## 2018-06-02 DIAGNOSIS — I442 Atrioventricular block, complete: Secondary | ICD-10-CM

## 2018-06-02 DIAGNOSIS — D649 Anemia, unspecified: Secondary | ICD-10-CM

## 2018-06-02 DIAGNOSIS — Z953 Presence of xenogenic heart valve: Secondary | ICD-10-CM

## 2018-06-02 DIAGNOSIS — I2510 Atherosclerotic heart disease of native coronary artery without angina pectoris: Secondary | ICD-10-CM | POA: Diagnosis not present

## 2018-06-02 DIAGNOSIS — I495 Sick sinus syndrome: Secondary | ICD-10-CM | POA: Diagnosis not present

## 2018-06-02 DIAGNOSIS — I340 Nonrheumatic mitral (valve) insufficiency: Secondary | ICD-10-CM

## 2018-06-02 DIAGNOSIS — D7589 Other specified diseases of blood and blood-forming organs: Secondary | ICD-10-CM

## 2018-06-02 DIAGNOSIS — Z5181 Encounter for therapeutic drug level monitoring: Secondary | ICD-10-CM | POA: Diagnosis not present

## 2018-06-02 DIAGNOSIS — Z950 Presence of cardiac pacemaker: Secondary | ICD-10-CM | POA: Diagnosis not present

## 2018-06-02 LAB — POCT INR: INR: 2.6 (ref 2.0–3.0)

## 2018-06-02 NOTE — Patient Instructions (Signed)
Please continue dosage of 1.5 tablets every day EXCEPT 2 TABLETS ON MONDAYS.  Recheck in 4 weeks.

## 2018-06-02 NOTE — Progress Notes (Signed)
Patient Care Team: Ezequiel Kayser, MD as PCP - General (Internal Medicine)   HPI  [name] is a 83 y.o. female Seen in follow-up for a pacemaker implanted 8/16 in the context of multi-valvular surgery bioprosthetic mitral valve replacement, tricuspid valve repair and MAZE operation. She developed postoperative complete heart block [facility])  She has a history of atrial fibrillation she also developed a sternal wound infection requiring debridement.  She is anticoagulated with Coumadin and aspirin  Records and Results Reviewed  Including hospital records. Echocardiogram 5/16 demonstrated normal LV function.  Subsequent echocardiogram demonstrated an ejection fraction of 30-35% in the context of RV apical pacing. This was inactivated which she was seen in 1/17 with immediate improvement in symptoms.  Echocardiogram 1/18 demonstrated near normal LV function  Date Cr K Hgb  8/17     9.2  5/18 0.8  11.5  5/19 0.8 3.9 13.5 (MCV 103<<89)   At her last visit, there were complaints of exercise intolerance.  We decreased rate response slope as well as the ADL rate from 100--85.  She was better.  She continues however to complain of exercise intolerance and fatigue    No bleeding     Past Medical History:  Diagnosis Date  . Anxiety   . Arthritis    "hands" (11/10/2015)  . CAD s/p CABG x 1    a. 04/2015 LIMA to LAD  . Chronic diastolic CHF (congestive heart failure) ([place])   . COPD (chronic obstructive pulmonary disease) ([place])   . COPD (chronic obstructive pulmonary disease) ([place])   . Coronary artery disease   . GERD (gastroesophageal reflux disease)   . History of colon polyps   . Hyperlipidemia   . Hypertension   . Hypothyroidism   . 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 04/2015 Complete bilateral atrial lesion set using cryothermy and bipolar radiofrequency ablation with clipping of LA appendage  . Meniere disease   . Meniere's disease   . On home oxygen therapy    "2L at  night" (11/10/2015)  . PAF (paroxysmal atrial fibrillation) ([place])   . Pneumonia ~ 2010  . PONV (postoperative nausea and vomiting)    Pt felt like eardrums were gonna pop  . Post-surgical complete heart block, symptomatic    a. 04/2015 s/p MDT [id number] Advisa DR MRI DC PPM.  . Presence of permanent cardiac pacemaker   . Pulmonary hypertension ([place])   . Severe Mitral Regurgitation s/p MV Repair    a. 04/2015 s/p 27 mm [facility] Mitral bovine bioprosthetic tissue valve  . Surgical wound, non healing - chest wall 11/10/2015  . Tricuspid Regurgitation s/p Repair    a. 04/2015 s/p 26 mm Edwards mc3 ring annuloplasty  . Wound infection 10/13/2015   Superficial sternal wound infection    Past Surgical History:  Procedure Laterality Date  . APPENDECTOMY    . APPLICATION OF A-CELL OF CHEST/ABDOMEN N/A 11/21/2015   Procedure: APPLICATION OF A-CELL OF CHEST/ABDOMEN;  Surgeon: Loel Lofty Dillingham, DO;  Location: [place];  Service: Plastics;  Laterality: N/A;  . APPLICATION OF A-CELL OF CHEST/ABDOMEN Right 12/21/2015   Procedure: APPLICATION OF A-CELL OF RIGHT CHEST;  Surgeon: Loel Lofty Dillingham, DO;  Location: [place];  Service: Plastics;  Laterality: Right;  . APPLICATION OF A-CELL OF CHEST/ABDOMEN Right 01/11/2016   Procedure: APPLICATION OF A-CELL OF RIGHT CHEST;  Surgeon: Loel Lofty Dillingham, DO;  Location: [place];  Service: Plastics;  Laterality: Right;  .  APPLICATION OF A-CELL OF CHEST/ABDOMEN Right 02/12/2016   Procedure: APPLICATION OF A-CELL TO RIGHT CHEST WOUND;  Surgeon: Loel Lofty Dillingham, DO;  Location: [place];  Service: Plastics;  Laterality: Right;  . APPLICATION OF WOUND VAC N/A 05/09/2015   Procedure: APPLICATION OF WOUND VAC;  Surgeon: Rexene Alberts, MD;  Location: [place];  Service: Thoracic;  Laterality: N/A;  . APPLICATION OF WOUND VAC N/A 11/13/2015   Procedure: APPLICATION OF WOUND VAC;  Surgeon: Rexene Alberts, MD;  Location: [place];  Service: Thoracic;  Laterality: N/A;  . APPLICATION OF  WOUND VAC N/A 11/21/2015   Procedure: APPLICATION OF WOUND VAC;  Surgeon: Loel Lofty Dillingham, DO;  Location: [place];  Service: Plastics;  Laterality: N/A;  . APPLICATION OF WOUND VAC Right 12/21/2015   Procedure: APPLICATION OF WOUND VAC to right chest wall ;  Surgeon: Loel Lofty Dillingham, DO;  Location: [place];  Service: Plastics;  Laterality: Right;  . APPLICATION OF WOUND VAC Right 01/11/2016   Procedure: APPLICATION OF WOUND VAC RIGHT CHEST ;  Surgeon: Loel Lofty Dillingham, DO;  Location: [place];  Service: Plastics;  Laterality: Right;  . APPLICATION OF WOUND VAC Right 01/24/2016   Procedure: APPLICATION OF WOUND VAC RIGHT CHEST WALL;  Surgeon: Loel Lofty Dillingham, DO;  Location: [place];  Service: Plastics;  Laterality: Right;  . APPLICATION OF WOUND VAC Right 02/12/2016   Procedure: RE-APPLICATION OF WOUND VAC TO RIGHT CHEST WOUND;  Surgeon: Loel Lofty Dillingham, DO;  Location: [place];  Service: Plastics;  Laterality: Right;  . BREAST BIOPSY Left 11/25/06   neg  . BREAST BIOPSY Left 01/20/12   /clip-neg  . CARDIAC CATHETERIZATION  11/2013   [facility]  . CARDIAC CATHETERIZATION  10/2014   [facility]  . CARDIAC VALVE REPLACEMENT    . CATARACT EXTRACTION W/ INTRAOCULAR LENS  IMPLANT, BILATERAL Bilateral 2014  . CLIPPING OF ATRIAL APPENDAGE N/A 04/25/2015   Procedure: CLIPPING OF ATRIAL APPENDAGE;  Surgeon: Rexene Alberts, MD;  Location: [place];  Service: Open Heart Surgery;  Laterality: N/A;  . COCHLEAR IMPLANT Left 2005?  . COLONOSCOPY WITH PROPOFOL N/A 01/20/2018   Procedure: COLONOSCOPY WITH PROPOFOL;  Surgeon: Toledo, Benay Pike, MD;  Location: [facility];  Service: Gastroenterology;  Laterality: N/A;  . CORONARY ANGIOPLASTY    . CORONARY ARTERY BYPASS GRAFT N/A 04/25/2015   Procedure: CORONARY ARTERY BYPASS GRAFTING (CABG) x ONE, using left internal mammary artery;  Surgeon: Rexene Alberts, MD;  Location: [place];  Service: Open Heart Surgery;  Laterality: N/A;  . DILATION AND CURETTAGE OF UTERUS  "several  before hysterectomy"  . EP IMPLANTABLE DEVICE N/A 05/02/2015   Procedure: Pacemaker Implant;  Surgeon: Thompson Grayer, MD;  Location: [place] [facility];  Service: Cardiovascular;  Laterality: N/A;  . ESOPHAGOGASTRODUODENOSCOPY (EGD) WITH PROPOFOL N/A 01/20/2018   Procedure: ESOPHAGOGASTRODUODENOSCOPY (EGD) WITH PROPOFOL;  Surgeon: Toledo, Benay Pike, MD;  Location: [facility];  Service: Gastroenterology;  Laterality: N/A;  . EYE SURGERY    . I&D EXTREMITY Right 12/06/2015   Procedure: IRRIGATION AND DEBRIDEMENT RIGHT CHEST WALL WITH ACELL PLACEMENT AND VAC;  Surgeon: Loel Lofty Dillingham, DO;  Location: [place];  Service: Plastics;  Laterality: Right;  . INCISION AND DRAINAGE OF WOUND N/A 11/21/2015   Procedure: IRRIGATION AND DEBRIDEMENT WOUND;  Surgeon: Loel Lofty Dillingham, DO;  Location: [place];  Service: Plastics;  Laterality: N/A;  . INCISION AND DRAINAGE OF WOUND Right 12/21/2015   Procedure: IRRIGATION AND DEBRIDEMENT right chest wall  WOUND;  Surgeon: Loel Lofty Dillingham, DO;  Location: [place];  Service: Plastics;  Laterality: Right;  right chest wall   . INCISION AND DRAINAGE OF WOUND Right 01/24/2016   Procedure: IRRIGATION AND DEBRIDEMENT RIGHT CHEST WALL WOUND;  Surgeon: Loel Lofty Dillingham, DO;  Location: [place];  Service: Plastics;  Laterality: Right;  . INCISION AND DRAINAGE OF WOUND Right 03/28/2016   Procedure: IRRIGATION AND DEBRIDEMENT RIGHT CHEST WALL WOUND WITH A Cell Placement;  Surgeon: Loel Lofty Dillingham, DO;  Location: [place];  Service: Plastics;  Laterality: Right;  . INCISION AND DRAINAGE OF WOUND Right 05/17/2016   Procedure: IRRIGATION AND DEBRIDEMENT OF RIGHT CHEST WOUND WITH A CELL PLACEMENT;  Surgeon: Loel Lofty Dillingham, DO;  Location: [place];  Service: Plastics;  Laterality: Right;  . INSERT / REPLACE / REMOVE PACEMAKER    . IRRIGATION AND DEBRIDEMENT OF WOUND WITH SPLIT THICKNESS SKIN GRAFT Right 01/11/2016   Procedure: IRRIGATION AND DEBRIDEMENT OF RIGHT CHEST WOUND ;  Surgeon:  Loel Lofty Dillingham, DO;  Location: [place];  Service: Plastics;  Laterality: Right;  . MASTECTOMY, PARTIAL Right 2002   positive  . MAZE N/A 04/25/2015   Procedure: MAZE;  Surgeon: Rexene Alberts, MD;  Location: [place];  Service: Open Heart Surgery;  Laterality: N/A;  . MITRAL VALVE REPAIR N/A 04/25/2015   Procedure: MITRAL VALVE  REPLACEMENT using a 27 mm Edwards Perimount Magna Mitral Ease Valve;  Surgeon: Rexene Alberts, MD;  Location: [place];  Service: Open Heart Surgery;  Laterality: N/A;  . SKIN SPLIT GRAFT Right 02/12/2016   Procedure: IRRIGATION AND DEBRIDEMENT RIGHT CHEST WOUND;  Surgeon: Loel Lofty Dillingham, DO;  Location: [place];  Service: Plastics;  Laterality: Right;  . STERNAL WIRES REMOVAL N/A 06/05/2015   Procedure: STERNAL WIRES REMOVAL;  Surgeon: Rexene Alberts, MD;  Location: [place];  Service: Thoracic;  Laterality: N/A;  . STERNAL WOUND DEBRIDEMENT N/A 05/09/2015   Procedure: STERNAL WOUND DEBRIDEMENT;  Surgeon: Rexene Alberts, MD;  Location: [place];  Service: Thoracic;  Laterality: N/A;  . STERNAL WOUND DEBRIDEMENT N/A 11/13/2015   Procedure: Excisional drainage of RIGHT Chest wall mass and breast mass ;  Surgeon: Rexene Alberts, MD;  Location: [place];  Service: Thoracic;  Laterality: N/A;  . TEE WITHOUT CARDIOVERSION N/A 04/25/2015   Procedure: TRANSESOPHAGEAL ECHOCARDIOGRAM (TEE);  Surgeon: Rexene Alberts, MD;  Location: [place];  Service: Open Heart Surgery;  Laterality: N/A;  . TONSILLECTOMY    . TRAM Right 11/18/2015   Procedure: VRAM Vertical Rectus Abdominus Muscle Flap;  Surgeon: Loel Lofty Dillingham, DO;  Location: [place];  Service: Plastics;  Laterality: Right;  RIght Back  . TRICUSPID VALVE REPLACEMENT N/A 04/25/2015   Procedure: TRICUSPID VALVE REPAIR;  Surgeon: Rexene Alberts, MD;  Location: [place];  Service: Open Heart Surgery;  Laterality: N/A;  . VAGINAL HYSTERECTOMY      Current Outpatient Medications  Medication Sig Dispense Refill  . acetaminophen (TYLENOL) 325 MG  tablet Take 2 tablets (650 mg total) by mouth every 4 (four) hours as needed for mild pain (or Fever >/= 101).    . AMBULATORY NON FORMULARY MEDICATION Inogen at Home Oxygen machine with tubing. DX: COPD DX Code: J44.9 1 each 0  . aspirin EC 81 MG EC tablet Take 1 tablet (81 mg total) by mouth daily.    Marland Kitchen azelastine (ASTELIN) 0.1 % nasal spray Place 1 spray into both nostrils 2 (two) times daily. Use in  each nostril as directed    . benzonatate (TESSALON) 100 MG capsule Take 1 capsule (100 mg total) by mouth 3 (three) times daily as needed for cough. 20 capsule 0  . bisoprolol (ZEBETA) 10 MG tablet Take 1 tablet (10 mg total) by mouth daily. 30 tablet 5  . bisoprolol-hydrochlorothiazide (ZIAC) 10-6.25 MG tablet Take 1 tablet by mouth daily.    . ferrous sulfate 325 (65 FE) MG tablet Take 325 mg by mouth daily with breakfast.    . fluticasone (FLONASE) 50 MCG/ACT nasal spray Place 1 spray into both nostrils daily as needed for allergies.     . furosemide (LASIX) 20 MG tablet Take 20 mg by mouth 2 (two) times daily.     Marland Kitchen gabapentin (NEURONTIN) 100 MG capsule Take 200 mg by mouth at bedtime.   1  . guaiFENesin-codeine 100-10 MG/5ML syrup Take 5 mLs by mouth every 4 (four) hours as needed for cough. 120 mL 0  . ipratropium-albuterol (DUONEB) 0.5-2.5 (3) MG/3ML SOLN Take 3 mLs by nebulization every 6 (six) hours as needed. DX:J44.9 1080 mL 1  . levothyroxine (SYNTHROID, LEVOTHROID) 88 MCG tablet Take 88 mcg by mouth daily before breakfast.    . losartan (COZAAR) 100 MG tablet Take 1 tablet (100 mg total) by mouth daily. 30 tablet 3  . Multiple Vitamins-Minerals (PRESERVISION AREDS 2 PO) Take 2 tablets by mouth daily.    . pantoprazole (PROTONIX) 40 MG tablet Take 40 mg by mouth 2 (two) times daily.     . potassium chloride SA (K-DUR,KLOR-CON) 20 MEQ tablet Take 20 mEq by mouth daily.  2  . simvastatin (ZOCOR) 20 MG tablet Take 20 mg by mouth at bedtime.     Marland Kitchen warfarin (COUMADIN) 2.5 MG tablet TAKE AS  DIRECTED BY COUMADIN CLINIC 55 tablet 2   No current facility-administered medications for this visit.     Allergies  Allergen Reactions  . Ace Inhibitors Cough and Other (See Comments)  . Amoxicillin-Pot Clavulanate Swelling and Other (See Comments)    JOINTS, PAIN Joint pain   . Penicillins Swelling and Other (See Comments)    SWOLLEN JOINTS  Has patient had a PCN reaction causing immediate rash, facial/tongue/throat swelling, SOB or lightheadedness with hypotension: Yes Has patient had a PCN reaction causing severe rash involving mucus membranes or skin necrosis: No Has patient had a PCN reaction that required hospitalization No Has patient had a PCN reaction occurring within the last 10 years: No If all of the above answers are "NO", then may proceed with Cephalosporin use.  Swollen joints  . Nifedipine Other (See Comments)    UNSPECIFIED UNSPECIFIED   . Propranolol Other (See Comments)    UNSPECIFIED UNSPECIFIED   . Diazepam Other (See Comments)    Made her "hyper" Made her "hyper" Made her "hyper"   . Meperidine Other (See Comments)    Made her "hyper" Made her "hyper" Made her "hyper"   . Propoxyphene Other (See Comments)    Made her "hyper" Made her "hyper" Made her "hyper"       Review of Systems negative except from HPI and PMH  Physical Exam BP (!) 153/63 (BP Location: Left Arm, Patient Position: Sitting, Cuff Size: Normal)   Pulse 63   Ht 5\' 1"  (1.549 m)   Wt 130 lb 8 oz (59.2 kg)   BMI 24.66 kg/m   Well developed and nourished with mild dyspnea HENT normal Neck supple with JVP-flat Clear Regular rate and rhythm, 2/6 murmurs or  gallops Abd-soft with active BS No Clubbing cyanosis tr edema Skin-warm and dry A & Oriented  Grossly normal sensory and motor function  ECG atrial pacing at 63 Intervals 36/11/46 Axis left -35  Assessment and  Plan  Atrial fibrillation-paroxysmal status post maze operation  Complete heart block  -Intermittent  Sinus node dysfunction   First-degree AV block profound  Status post mitral valve replacement-bioprosthetic with tricuspid valve repair  Pacemaker-Medtronic  Dypsnea on Exertion   Macrocytosis     We have reprogrammed her pacemaker to shorten the AV delay and to increase her ADL rate from 85--90.  We walked she could walk and talk at the same time.  She thought she was better.  This may improve the physiology of 82 filling.  Some interval atrial arrhythmias but of inconsequential length  No significant volume overload.  I am impressed at the change of her MCV; we will check her hemoglobin again as well as a B12 and folate.  We spent more than 50% of our >25 min visit in face to face counseling regarding the above

## 2018-06-02 NOTE — Patient Instructions (Addendum)
Medication Instructions: - Your physician has recommended you make the following change in your medication:  1) DECREASE bisoprolol / hctz 10/6.25 mg- take 1/2 tablet (5/ 3.125 mg) once daily  Labwork: - Your physician recommends that you have lab work today: CBC/ B12/ folate  Procedures/Testing: - none ordered  Follow-Up: - Remote monitoring is used to monitor your Pacemaker of ICD from home. This monitoring reduces the number of office visits required to check your device to one time per year. It allows [place] to keep an eye on the functioning of your device to ensure it is working properly. You are scheduled for a device check from home on 06/11/18. You may send your transmission at any time that day. If you have a wireless device, the transmission will be sent automatically. After your physician reviews your transmission, you will receive a postcard with your next transmission date.  - Your physician recommends that you schedule a follow-up appointment in: 3 months with Dr. Caryl Comes.   Any Additional Special Instructions Will Be Listed Below (If Applicable).     If you need a refill on your cardiac medications before your next appointment, please call your pharmacy.

## 2018-06-03 LAB — CBC WITH DIFFERENTIAL/PLATELET
Basophils Absolute: 0 10*3/uL (ref 0.0–0.2)
Basos: 0 %
EOS (ABSOLUTE): 0.1 10*3/uL (ref 0.0–0.4)
Eos: 2 %
Hematocrit: 39.3 % (ref 34.0–46.6)
Hemoglobin: 13.2 g/dL (ref 11.1–15.9)
Immature Grans (Abs): 0 10*3/uL (ref 0.0–0.1)
Immature Granulocytes: 0 %
LYMPHS ABS: 0.8 10*3/uL (ref 0.7–3.1)
Lymphs: 12 %
MCH: 31.7 pg (ref 26.6–33.0)
MCHC: 33.6 g/dL (ref 31.5–35.7)
MCV: 95 fL (ref 79–97)
Monocytes Absolute: 0.6 10*3/uL (ref 0.1–0.9)
Monocytes: 9 %
NEUTROS ABS: 5.2 10*3/uL (ref 1.4–7.0)
Neutrophils: 77 %
PLATELETS: 216 10*3/uL (ref 150–450)
RBC: 4.16 x10E6/uL (ref 3.77–5.28)
RDW: 14.1 % (ref 12.3–15.4)
WBC: 6.8 10*3/uL (ref 3.4–10.8)

## 2018-06-03 LAB — B12 AND FOLATE PANEL
Folate: 17.7 ng/mL (ref >3.0)
Vitamin B-12: 474 pg/mL (ref 232–1245)

## 2018-06-11 ENCOUNTER — Telehealth: Payer: Self-pay | Admitting: Cardiology

## 2018-06-11 ENCOUNTER — Ambulatory Visit (INDEPENDENT_AMBULATORY_CARE_PROVIDER_SITE_OTHER): Payer: Medicare Other | Admitting: *Deleted

## 2018-06-11 DIAGNOSIS — I442 Atrioventricular block, complete: Secondary | ICD-10-CM

## 2018-06-11 DIAGNOSIS — I5022 Chronic systolic (congestive) heart failure: Secondary | ICD-10-CM

## 2018-06-11 NOTE — Telephone Encounter (Signed)
Spoke with pt and reminded pt of remote transmission that is due today. Pt verbalized understanding.   

## 2018-06-12 NOTE — Progress Notes (Signed)
Remote pacemaker transmission.   

## 2018-06-20 LAB — CUP PACEART REMOTE DEVICE CHECK
Brady Statistic AP VP Percent: 89.58 %
Brady Statistic AP VS Percent: 0.29 %
Brady Statistic AS VP Percent: 10.08 %
Brady Statistic RA Percent Paced: 89.11 %
Implantable Lead Implant Date: 20160823
Implantable Lead Implant Date: 20160823
Implantable Lead Model: 5076
Lead Channel Impedance Value: 361 Ohm
Lead Channel Impedance Value: 418 Ohm
Lead Channel Impedance Value: 437 Ohm
Lead Channel Pacing Threshold Amplitude: 0.5 V
Lead Channel Pacing Threshold Pulse Width: 0.4 ms
Lead Channel Sensing Intrinsic Amplitude: 13.75 mV
Lead Channel Sensing Intrinsic Amplitude: 2 mV
Lead Channel Setting Pacing Amplitude: 2 V
Lead Channel Setting Pacing Amplitude: 2.5 V
Lead Channel Setting Pacing Pulse Width: 0.4 ms
MDC IDC LEAD LOCATION: 753859
MDC IDC LEAD LOCATION: 753860
MDC IDC MSMT BATTERY REMAINING LONGEVITY: 80 mo
MDC IDC MSMT BATTERY VOLTAGE: 3 V
MDC IDC MSMT LEADCHNL RA PACING THRESHOLD AMPLITUDE: 0.375 V
MDC IDC MSMT LEADCHNL RA PACING THRESHOLD PULSEWIDTH: 0.4 ms
MDC IDC MSMT LEADCHNL RA SENSING INTR AMPL: 2 mV
MDC IDC MSMT LEADCHNL RV IMPEDANCE VALUE: 437 Ohm
MDC IDC MSMT LEADCHNL RV SENSING INTR AMPL: 13.75 mV
MDC IDC PG IMPLANT DT: 20160823
MDC IDC SESS DTM: 20191003180411
MDC IDC SET LEADCHNL RV SENSING SENSITIVITY: 2.8 mV
MDC IDC STAT BRADY AS VS PERCENT: 0.05 %
MDC IDC STAT BRADY RV PERCENT PACED: 98.78 %

## 2018-07-01 ENCOUNTER — Ambulatory Visit (INDEPENDENT_AMBULATORY_CARE_PROVIDER_SITE_OTHER): Payer: Medicare Other

## 2018-07-01 DIAGNOSIS — I340 Nonrheumatic mitral (valve) insufficiency: Secondary | ICD-10-CM | POA: Diagnosis not present

## 2018-07-01 DIAGNOSIS — Z953 Presence of xenogenic heart valve: Secondary | ICD-10-CM | POA: Diagnosis not present

## 2018-07-01 DIAGNOSIS — Z5181 Encounter for therapeutic drug level monitoring: Secondary | ICD-10-CM

## 2018-07-01 LAB — POCT INR: INR: 3.5 — AB (ref 2.0–3.0)

## 2018-07-01 NOTE — Patient Instructions (Signed)
Please skip coumadin tonight, then continue dosage of 1.5 tablets every day EXCEPT 2 TABLETS ON MONDAYS.  Recheck in 3 weeks.

## 2018-07-05 ENCOUNTER — Other Ambulatory Visit: Payer: Self-pay | Admitting: Cardiovascular Disease

## 2018-07-06 NOTE — Telephone Encounter (Signed)
Please review for refill.  

## 2018-07-09 ENCOUNTER — Telehealth: Payer: Self-pay

## 2018-07-09 DIAGNOSIS — I5022 Chronic systolic (congestive) heart failure: Secondary | ICD-10-CM

## 2018-07-09 NOTE — Telephone Encounter (Signed)
I recommend scheduling an echocardiogram to see what is going on.  She should stay on Lasix 20 mg daily for now until we obtain the echocardiogram.

## 2018-07-09 NOTE — Telephone Encounter (Signed)
Dr. Fletcher Anon,   She has been on lasix 20 mg BID for her regular dosing- do you want her to go back to this or decrease to 20 mg once daily?  Thanks!

## 2018-07-09 NOTE — Telephone Encounter (Signed)
She can stay on her regular dose of 20mg  bid.

## 2018-07-09 NOTE — Telephone Encounter (Signed)
Pt c/o swelling: STAT is pt has developed SOB within 24 hours  1) How much weight have you gained and in what time span? 4 days   2) If swelling, where is the swelling located? Hands and feet   3) Are you currently taking a fluid pill? Yes she has even doubled up   4) Are you currently SOB? A little   5) Do you have a log of your daily weights (if so, list)?   6) Have you gained 3 pounds in a day or 5 pounds in a week? Lost one pound but gained over two pounds   7) Have you traveled recently? No

## 2018-07-09 NOTE — Telephone Encounter (Signed)
I called and spoke with the patient about Dr. Tyrell Antonio recommendations.   She voices understanding to decrease lasix back to 20 mg BID. She is agreeable with undergoing an echocardiogram. She is aware I will place the order for this and have scheduling call her to arrange.

## 2018-07-09 NOTE — Telephone Encounter (Signed)
I spoke with the patient.  She reports that she had gained 2 lbs about 4 days ago. She increased her lasix to 20 mg- 2 tablets (40 mg ) by mouth twice daily since then, as she reports Dr. Fletcher Anon advised she could do this for increased swelling. She has lost 1 lb. She does complain of swelling in her bilateral feet/ ankles that does not completely resolve over night. She complains of some SOB but mostly weakness just walking across the room.    She last saw Dr. Caryl Comes on 06/02/18 and he decreased her bisoproplol/ hctz 10/6.25 mg- to 1/2 tablet once daily due to fatigue. Per the patient, she feels like her fatigue is worse and not better.  She has had a slight increase in her urine output since doubling up on her lasix. She reports drinking a little more fluid than normal because she wanted to make sure she urinated.  I advised the patient I will forward to Dr. Fletcher Anon to review and call her back with any further recommendations.  She voices understanding and is agreeable.

## 2018-07-17 ENCOUNTER — Other Ambulatory Visit: Payer: Self-pay

## 2018-07-17 ENCOUNTER — Ambulatory Visit (INDEPENDENT_AMBULATORY_CARE_PROVIDER_SITE_OTHER): Payer: Medicare Other

## 2018-07-17 ENCOUNTER — Encounter: Payer: Self-pay | Admitting: Internal Medicine

## 2018-07-17 ENCOUNTER — Emergency Department: Payer: Medicare Other

## 2018-07-17 ENCOUNTER — Inpatient Hospital Stay
Admission: EM | Admit: 2018-07-17 | Discharge: 2018-07-22 | DRG: 308 | Disposition: A | Discharge status: Home / Self Care | Payer: Medicare Other | Attending: Internal Medicine | Admitting: Internal Medicine

## 2018-07-17 ENCOUNTER — Encounter: Payer: Self-pay | Admitting: Nurse Practitioner

## 2018-07-17 ENCOUNTER — Ambulatory Visit (INDEPENDENT_AMBULATORY_CARE_PROVIDER_SITE_OTHER): Payer: Medicare Other | Admitting: Nurse Practitioner

## 2018-07-17 VITALS — BP 146/66 | HR 126 | Resp 16

## 2018-07-17 DIAGNOSIS — I4819 Other persistent atrial fibrillation: Secondary | ICD-10-CM | POA: Diagnosis present

## 2018-07-17 DIAGNOSIS — I2510 Atherosclerotic heart disease of native coronary artery without angina pectoris: Secondary | ICD-10-CM

## 2018-07-17 DIAGNOSIS — I272 Pulmonary hypertension, unspecified: Secondary | ICD-10-CM | POA: Diagnosis present

## 2018-07-17 DIAGNOSIS — Z9841 Cataract extraction status, right eye: Secondary | ICD-10-CM

## 2018-07-17 DIAGNOSIS — Z86010 Personal history of colonic polyps: Secondary | ICD-10-CM

## 2018-07-17 DIAGNOSIS — Z8719 Personal history of other diseases of the digestive system: Secondary | ICD-10-CM

## 2018-07-17 DIAGNOSIS — I484 Atypical atrial flutter: Secondary | ICD-10-CM | POA: Diagnosis not present

## 2018-07-17 DIAGNOSIS — I38 Endocarditis, valve unspecified: Secondary | ICD-10-CM

## 2018-07-17 DIAGNOSIS — I4891 Unspecified atrial fibrillation: Secondary | ICD-10-CM | POA: Diagnosis present

## 2018-07-17 DIAGNOSIS — E782 Mixed hyperlipidemia: Secondary | ICD-10-CM

## 2018-07-17 DIAGNOSIS — E7800 Pure hypercholesterolemia, unspecified: Secondary | ICD-10-CM | POA: Diagnosis present

## 2018-07-17 DIAGNOSIS — J449 Chronic obstructive pulmonary disease, unspecified: Secondary | ICD-10-CM | POA: Diagnosis present

## 2018-07-17 DIAGNOSIS — I10 Essential (primary) hypertension: Secondary | ICD-10-CM | POA: Diagnosis not present

## 2018-07-17 DIAGNOSIS — I442 Atrioventricular block, complete: Secondary | ICD-10-CM | POA: Diagnosis present

## 2018-07-17 DIAGNOSIS — Z961 Presence of intraocular lens: Secondary | ICD-10-CM | POA: Diagnosis present

## 2018-07-17 DIAGNOSIS — Z66 Do not resuscitate: Secondary | ICD-10-CM | POA: Diagnosis present

## 2018-07-17 DIAGNOSIS — Z951 Presence of aortocoronary bypass graft: Secondary | ICD-10-CM

## 2018-07-17 DIAGNOSIS — M19042 Primary osteoarthritis, left hand: Secondary | ICD-10-CM | POA: Diagnosis present

## 2018-07-17 DIAGNOSIS — Z880 Allergy status to penicillin: Secondary | ICD-10-CM

## 2018-07-17 DIAGNOSIS — E039 Hypothyroidism, unspecified: Secondary | ICD-10-CM | POA: Diagnosis present

## 2018-07-17 DIAGNOSIS — I5023 Acute on chronic systolic (congestive) heart failure: Secondary | ICD-10-CM | POA: Diagnosis not present

## 2018-07-17 DIAGNOSIS — Z9842 Cataract extraction status, left eye: Secondary | ICD-10-CM | POA: Diagnosis not present

## 2018-07-17 DIAGNOSIS — Z950 Presence of cardiac pacemaker: Secondary | ICD-10-CM | POA: Diagnosis not present

## 2018-07-17 DIAGNOSIS — Z9981 Dependence on supplemental oxygen: Secondary | ICD-10-CM

## 2018-07-17 DIAGNOSIS — Z7982 Long term (current) use of aspirin: Secondary | ICD-10-CM

## 2018-07-17 DIAGNOSIS — Z881 Allergy status to other antibiotic agents status: Secondary | ICD-10-CM

## 2018-07-17 DIAGNOSIS — E785 Hyperlipidemia, unspecified: Secondary | ICD-10-CM | POA: Diagnosis present

## 2018-07-17 DIAGNOSIS — I5022 Chronic systolic (congestive) heart failure: Secondary | ICD-10-CM

## 2018-07-17 DIAGNOSIS — Z953 Presence of xenogenic heart valve: Secondary | ICD-10-CM

## 2018-07-17 DIAGNOSIS — Z87891 Personal history of nicotine dependence: Secondary | ICD-10-CM

## 2018-07-17 DIAGNOSIS — R7301 Impaired fasting glucose: Secondary | ICD-10-CM | POA: Diagnosis present

## 2018-07-17 DIAGNOSIS — R0602 Shortness of breath: Secondary | ICD-10-CM

## 2018-07-17 DIAGNOSIS — Z90710 Acquired absence of both cervix and uterus: Secondary | ICD-10-CM

## 2018-07-17 DIAGNOSIS — I5021 Acute systolic (congestive) heart failure: Secondary | ICD-10-CM | POA: Diagnosis not present

## 2018-07-17 DIAGNOSIS — I5043 Acute on chronic combined systolic (congestive) and diastolic (congestive) heart failure: Secondary | ICD-10-CM | POA: Diagnosis not present

## 2018-07-17 DIAGNOSIS — Z79890 Hormone replacement therapy (postmenopausal): Secondary | ICD-10-CM

## 2018-07-17 DIAGNOSIS — I480 Paroxysmal atrial fibrillation: Secondary | ICD-10-CM

## 2018-07-17 DIAGNOSIS — K219 Gastro-esophageal reflux disease without esophagitis: Secondary | ICD-10-CM | POA: Diagnosis present

## 2018-07-17 DIAGNOSIS — H8109 Meniere's disease, unspecified ear: Secondary | ICD-10-CM | POA: Diagnosis present

## 2018-07-17 DIAGNOSIS — Z7901 Long term (current) use of anticoagulants: Secondary | ICD-10-CM

## 2018-07-17 DIAGNOSIS — Z9621 Cochlear implant status: Secondary | ICD-10-CM

## 2018-07-17 DIAGNOSIS — Z888 Allergy status to other drugs, medicaments and biological substances status: Secondary | ICD-10-CM

## 2018-07-17 DIAGNOSIS — Z8249 Family history of ischemic heart disease and other diseases of the circulatory system: Secondary | ICD-10-CM

## 2018-07-17 DIAGNOSIS — M19041 Primary osteoarthritis, right hand: Secondary | ICD-10-CM | POA: Diagnosis present

## 2018-07-17 DIAGNOSIS — G4730 Sleep apnea, unspecified: Secondary | ICD-10-CM | POA: Diagnosis present

## 2018-07-17 DIAGNOSIS — I110 Hypertensive heart disease with heart failure: Secondary | ICD-10-CM | POA: Diagnosis present

## 2018-07-17 DIAGNOSIS — Z823 Family history of stroke: Secondary | ICD-10-CM

## 2018-07-17 DIAGNOSIS — Z803 Family history of malignant neoplasm of breast: Secondary | ICD-10-CM

## 2018-07-17 DIAGNOSIS — Z7951 Long term (current) use of inhaled steroids: Secondary | ICD-10-CM

## 2018-07-17 LAB — BASIC METABOLIC PANEL
Anion gap: 8 (ref 5–15)
BUN: 25 mg/dL — ABNORMAL HIGH (ref 8–23)
CO2: 32 mmol/L (ref 22–32)
CREATININE: 1.06 mg/dL — AB (ref 0.44–1.00)
Calcium: 9.2 mg/dL (ref 8.9–10.3)
Chloride: 102 mmol/L (ref 98–111)
GFR calc Af Amer: 56 mL/min — ABNORMAL LOW (ref >60)
GFR, EST NON AFRICAN AMERICAN: 48 mL/min — AB (ref >60)
Glucose, Bld: 179 mg/dL — ABNORMAL HIGH (ref 70–99)
Potassium: 4.1 mmol/L (ref 3.5–5.1)
SODIUM: 142 mmol/L (ref 135–145)

## 2018-07-17 LAB — BRAIN NATRIURETIC PEPTIDE: B NATRIURETIC PEPTIDE 5: 477 pg/mL — AB (ref 0.0–100.0)

## 2018-07-17 LAB — PROTIME-INR
INR: 2.09
Prothrombin Time: 23.2 seconds — ABNORMAL HIGH (ref 11.4–15.2)

## 2018-07-17 LAB — CBC
HEMATOCRIT: 34.8 % — AB (ref 36.0–46.0)
Hemoglobin: 10.8 g/dL — ABNORMAL LOW (ref 12.0–15.0)
MCH: 30.7 pg (ref 26.0–34.0)
MCHC: 31 g/dL (ref 30.0–36.0)
MCV: 98.9 fL (ref 80.0–100.0)
Platelets: 248 10*3/uL (ref 150–400)
RBC: 3.52 MIL/uL — ABNORMAL LOW (ref 3.87–5.11)
RDW: 14.3 % (ref 11.5–15.5)
WBC: 7.5 10*3/uL (ref 4.0–10.5)
nRBC: 0 % (ref 0.0–0.2)

## 2018-07-17 LAB — TROPONIN I: Troponin I: <0.03 ng/mL (ref <0.03)

## 2018-07-17 MED ORDER — FUROSEMIDE 10 MG/ML IJ SOLN
40.0000 mg | Freq: Once | INTRAMUSCULAR | Status: AC
  Administered 2018-07-17: 40 mg via INTRAVENOUS
  Filled 2018-07-17: qty 4

## 2018-07-17 MED ORDER — LEVOTHYROXINE SODIUM 88 MCG PO TABS
88.0000 ug | ORAL_TABLET | Freq: Every day | ORAL | Status: DC
  Administered 2018-07-18 – 2018-07-22 (×5): 88 ug via ORAL
  Filled 2018-07-17 (×5): qty 1

## 2018-07-17 MED ORDER — AMIODARONE HCL IN DEXTROSE 360-4.14 MG/200ML-% IV SOLN
INTRAVENOUS | Status: AC
  Administered 2018-07-17: 60 mg/h via INTRAVENOUS
  Filled 2018-07-17: qty 200

## 2018-07-17 MED ORDER — AMIODARONE LOAD VIA INFUSION: 150.0000 mg | Freq: Once | INTRAVENOUS | Status: DC

## 2018-07-17 MED ORDER — SIMVASTATIN 10 MG PO TABS
20.0000 mg | ORAL_TABLET | Freq: Every day | ORAL | Status: DC
  Administered 2018-07-17 – 2018-07-18 (×2): 20 mg via ORAL
  Filled 2018-07-17 (×3): qty 2

## 2018-07-17 MED ORDER — ACETAMINOPHEN 650 MG RE SUPP: 650.0000 mg | Freq: Four times a day (QID) | RECTAL | Status: DC | PRN

## 2018-07-17 MED ORDER — FERROUS SULFATE 325 (65 FE) MG PO TABS
325.0000 mg | ORAL_TABLET | Freq: Every day | ORAL | Status: DC
  Administered 2018-07-18 – 2018-07-22 (×5): 325 mg via ORAL
  Filled 2018-07-17 (×5): qty 1

## 2018-07-17 MED ORDER — AMIODARONE HCL IN DEXTROSE 360-4.14 MG/200ML-% IV SOLN
60.0000 mg/h | INTRAVENOUS | Status: DC
  Administered 2018-07-17 (×2): 60 mg/h via INTRAVENOUS
  Filled 2018-07-17: qty 200

## 2018-07-17 MED ORDER — LOSARTAN POTASSIUM 50 MG PO TABS
100.0000 mg | ORAL_TABLET | Freq: Every day | ORAL | Status: DC
  Filled 2018-07-17: qty 2

## 2018-07-17 MED ORDER — AMIODARONE HCL IN DEXTROSE 360-4.14 MG/200ML-% IV SOLN
30.0000 mg/h | INTRAVENOUS | Status: DC
  Administered 2018-07-18 – 2018-07-20 (×7): 30 mg/h via INTRAVENOUS
  Filled 2018-07-17 (×6): qty 200

## 2018-07-17 MED ORDER — ACETAMINOPHEN 325 MG PO TABS: 650.0000 mg | ORAL_TABLET | Freq: Four times a day (QID) | ORAL | Status: DC | PRN

## 2018-07-17 MED ORDER — ONDANSETRON HCL 4 MG PO TABS: 4.0000 mg | ORAL_TABLET | Freq: Four times a day (QID) | ORAL | Status: DC | PRN

## 2018-07-17 MED ORDER — AMIODARONE IV BOLUS ONLY 150 MG/100ML
INTRAVENOUS | Status: AC
  Filled 2018-07-17: qty 100

## 2018-07-17 MED ORDER — WARFARIN - PHARMACIST DOSING INPATIENT
Freq: Every day | Status: DC
  Administered 2018-07-18 – 2018-07-21 (×2)

## 2018-07-17 MED ORDER — ONDANSETRON HCL 4 MG/2ML IJ SOLN: 4.0000 mg | Freq: Four times a day (QID) | INTRAMUSCULAR | Status: DC | PRN

## 2018-07-17 MED ORDER — FUROSEMIDE 10 MG/ML IJ SOLN
40.0000 mg | Freq: Every day | INTRAMUSCULAR | Status: DC
  Administered 2018-07-18 – 2018-07-19 (×2): 40 mg via INTRAVENOUS
  Filled 2018-07-17 (×2): qty 4

## 2018-07-17 MED ORDER — WARFARIN SODIUM 2.5 MG PO TABS: 3.7500 mg | ORAL_TABLET | Freq: Every day | ORAL | Status: DC

## 2018-07-17 MED ORDER — OCUVITE-LUTEIN PO CAPS
2.0000 | ORAL_CAPSULE | Freq: Every day | ORAL | Status: DC
  Administered 2018-07-18 – 2018-07-22 (×5): 2 via ORAL
  Filled 2018-07-17 (×5): qty 2

## 2018-07-17 MED ORDER — GABAPENTIN 100 MG PO CAPS
200.0000 mg | ORAL_CAPSULE | Freq: Every day | ORAL | Status: DC
  Administered 2018-07-17 – 2018-07-21 (×5): 200 mg via ORAL
  Filled 2018-07-17 (×5): qty 2

## 2018-07-17 MED ORDER — BISOPROLOL FUMARATE 5 MG PO TABS
10.0000 mg | ORAL_TABLET | Freq: Every day | ORAL | Status: DC
  Administered 2018-07-18 – 2018-07-22 (×5): 10 mg via ORAL
  Filled 2018-07-17 (×6): qty 2

## 2018-07-17 MED ORDER — FLUTICASONE PROPIONATE 50 MCG/ACT NA SUSP
1.0000 | Freq: Every day | NASAL | Status: DC | PRN
  Filled 2018-07-17: qty 16

## 2018-07-17 MED ORDER — IPRATROPIUM-ALBUTEROL 0.5-2.5 (3) MG/3ML IN SOLN
3.0000 mL | Freq: Four times a day (QID) | RESPIRATORY_TRACT | Status: DC
  Administered 2018-07-18 (×2): 3 mL via RESPIRATORY_TRACT
  Filled 2018-07-17 (×2): qty 3

## 2018-07-17 MED ORDER — WARFARIN SODIUM 2 MG PO TABS
2.0000 mg | ORAL_TABLET | Freq: Once | ORAL | Status: AC
  Administered 2018-07-17: 2 mg via ORAL
  Filled 2018-07-17: qty 1

## 2018-07-17 MED ORDER — PANTOPRAZOLE SODIUM 40 MG PO TBEC
40.0000 mg | DELAYED_RELEASE_TABLET | Freq: Two times a day (BID) | ORAL | Status: DC
  Administered 2018-07-17 – 2018-07-22 (×10): 40 mg via ORAL
  Filled 2018-07-17 (×10): qty 1

## 2018-07-17 MED ORDER — POTASSIUM CHLORIDE CRYS ER 20 MEQ PO TBCR
20.0000 meq | EXTENDED_RELEASE_TABLET | Freq: Every day | ORAL | Status: DC
  Administered 2018-07-18 – 2018-07-19 (×2): 20 meq via ORAL
  Filled 2018-07-17 (×2): qty 1

## 2018-07-17 MED ORDER — ASPIRIN EC 81 MG PO TBEC
81.0000 mg | DELAYED_RELEASE_TABLET | Freq: Every day | ORAL | Status: DC
  Administered 2018-07-18 – 2018-07-22 (×5): 81 mg via ORAL
  Filled 2018-07-17 (×5): qty 1

## 2018-07-17 MED ORDER — AZELASTINE HCL 0.1 % NA SOLN
1.0000 | Freq: Two times a day (BID) | NASAL | Status: DC | PRN
  Filled 2018-07-17: qty 30

## 2018-07-17 NOTE — ED Triage Notes (Signed)
Pt presents from [place] clinic and was sent over for AFIB with RVR. Pt has cardiac sx at [facility]. Pt went to the MD today for trouble with walking movement that started 2 weeks ago.

## 2018-07-17 NOTE — ED Notes (Addendum)
150mg  IV given from ED Main Pyxis (just one dose given)

## 2018-07-17 NOTE — Progress Notes (Signed)
Patient ID: [name], female   DOB: 05/16/1935, 83 y.o.   MRN: [id number]  ACP note  Patient and family present  Diagnosis: Atrial fibrillation with rapid ventricular response, acute on chronic systolic congestive heart failure, COPD, sleep apnea, history of CAD, hyperlipidemia, GERD, hypothyroidism.  CODE STATUS discussed and patient wishes to be a DO NOT RESUSCITATE  Plan.  Atrial fibrillation with rapid ventricular response.  ER physician spoke with cardiology and they recommended amiodarone if the patient goes fast again.  Continue bisoprolol also.  Diuresis with Lasix for acute on chronic systolic congestive heart failure.  Time spent on ACP discussion 17 minutes Dr. Loletha Grayer

## 2018-07-17 NOTE — H&P (Addendum)
[name] at [place] NAME: [name]    MR#:  [id number]  DATE OF BIRTH:  1935/01/30  DATE OF ADMISSION:  07/17/2018  PRIMARY CARE PHYSICIAN: Ezequiel Kayser, MD   REQUESTING/REFERRING PHYSICIAN: DR Cherylann Banas  CHIEF COMPLAINT:   Chief Complaint  Patient presents with  . Abnormal ECG    HISTORY OF PRESENT ILLNESS:  [name]  is a 83 y.o. female with a known history of paroxysmal atrial fibrillation.  For the past 3 weeks has been weak and giving out with walking around.  She can hardly get across the room.  She went for an echocardiogram today and it showed.  I was able rapid atrial fibrillation.  She was sent over to the emergency room by the cardiology team.  The patient complains of palpitations.  Shortness of breath with lying down.  No complaints of chest pain.  Feeling weak.  Hospitalist services were contacted for further evaluation.  ER physician spoke with cardiology team.  PAST MEDICAL HISTORY:   Past Medical History:  Diagnosis Date  . Anxiety   . Arthritis    "hands" (11/10/2015)  . CAD s/p CABG x 1    a. 04/2015 LIMA to LAD  . Chronic combined systolic (congestive) and diastolic (congestive) heart failure ([place])    a. 01/2015 Echo: EF 50-55%, Gr2 DD (preop valve surgery); b. 08/2015 Echo: EF 30-35%, diff HK. Gr2 DD; c. 09/2016 Echo: EF 50-55%. Nl fxning TV ring. Mildly dil LA.  Marland Kitchen COPD (chronic obstructive pulmonary disease) ([place])   . Coronary artery disease   . GERD (gastroesophageal reflux disease)   . History of colon polyps   . History of mitral valve replacement    a. 04/2015 s/p 27 mm [facility] Mitral bovine bioprosthetic tissue valve  . Hyperlipidemia   . Hypertension   . Hypothyroidism   . 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 04/2015 Complete bilateral atrial lesion set using cryothermy and bipolar radiofrequency ablation with clipping of LA appendage  . Meniere disease   . Meniere's disease   . On  home oxygen therapy    "2L at night" (11/10/2015)  . PAF (paroxysmal atrial fibrillation) ([place])   . Pneumonia ~ 2010  . PONV (postoperative nausea and vomiting)    Pt felt like eardrums were gonna pop  . Post-surgical complete heart block, symptomatic    a. 04/2015 s/p MDT [id number] [place] DR MRI DC PPM.  . Presence of permanent cardiac pacemaker   . Pulmonary hypertension ([place])   . Surgical wound, non healing - chest wall 11/10/2015  . Tricuspid Regurgitation s/p Repair    a. 04/2015 s/p 26 mm Edwards mc3 ring annuloplasty  . Wound infection 10/13/2015   Superficial sternal wound infection    PAST SURGICAL HISTORY:   Past Surgical History:  Procedure Laterality Date  . APPENDECTOMY    . APPLICATION OF A-CELL OF CHEST/ABDOMEN N/A 11/21/2015   Procedure: APPLICATION OF A-CELL OF CHEST/ABDOMEN;  Surgeon: Loel Lofty Dillingham, DO;  Location: [place];  Service: Plastics;  Laterality: N/A;  . APPLICATION OF A-CELL OF CHEST/ABDOMEN Right 12/21/2015   Procedure: APPLICATION OF A-CELL OF RIGHT CHEST;  Surgeon: Loel Lofty Dillingham, DO;  Location: [place];  Service: Plastics;  Laterality: Right;  . APPLICATION OF A-CELL OF CHEST/ABDOMEN Right 01/11/2016   Procedure: APPLICATION OF A-CELL OF RIGHT CHEST;  Surgeon: Loel Lofty Dillingham, DO;  Location: [place];  Service: Plastics;  Laterality: Right;  .  APPLICATION OF A-CELL OF CHEST/ABDOMEN Right 02/12/2016   Procedure: APPLICATION OF A-CELL TO RIGHT CHEST WOUND;  Surgeon: Loel Lofty Dillingham, DO;  Location: [place];  Service: Plastics;  Laterality: Right;  . APPLICATION OF WOUND VAC N/A 05/09/2015   Procedure: APPLICATION OF WOUND VAC;  Surgeon: Rexene Alberts, MD;  Location: [place];  Service: Thoracic;  Laterality: N/A;  . APPLICATION OF WOUND VAC N/A 11/13/2015   Procedure: APPLICATION OF WOUND VAC;  Surgeon: Rexene Alberts, MD;  Location: [place];  Service: Thoracic;  Laterality: N/A;  . APPLICATION OF WOUND VAC N/A 11/21/2015   Procedure: APPLICATION OF WOUND VAC;   Surgeon: Loel Lofty Dillingham, DO;  Location: [place];  Service: Plastics;  Laterality: N/A;  . APPLICATION OF WOUND VAC Right 12/21/2015   Procedure: APPLICATION OF WOUND VAC to right chest wall ;  Surgeon: Loel Lofty Dillingham, DO;  Location: [place];  Service: Plastics;  Laterality: Right;  . APPLICATION OF WOUND VAC Right 01/11/2016   Procedure: APPLICATION OF WOUND VAC RIGHT CHEST ;  Surgeon: Loel Lofty Dillingham, DO;  Location: [place];  Service: Plastics;  Laterality: Right;  . APPLICATION OF WOUND VAC Right 01/24/2016   Procedure: APPLICATION OF WOUND VAC RIGHT CHEST WALL;  Surgeon: Loel Lofty Dillingham, DO;  Location: [place];  Service: Plastics;  Laterality: Right;  . APPLICATION OF WOUND VAC Right 02/12/2016   Procedure: RE-APPLICATION OF WOUND VAC TO RIGHT CHEST WOUND;  Surgeon: Loel Lofty Dillingham, DO;  Location: [place];  Service: Plastics;  Laterality: Right;  . BREAST BIOPSY Left 11/25/06   neg  . BREAST BIOPSY Left 01/20/12   /clip-neg  . CARDIAC CATHETERIZATION  11/2013   [facility]  . CARDIAC CATHETERIZATION  10/2014   [facility]  . CARDIAC VALVE REPLACEMENT    . CATARACT EXTRACTION W/ INTRAOCULAR LENS  IMPLANT, BILATERAL Bilateral 2014  . CLIPPING OF ATRIAL APPENDAGE N/A 04/25/2015   Procedure: CLIPPING OF ATRIAL APPENDAGE;  Surgeon: Rexene Alberts, MD;  Location: [place];  Service: Open Heart Surgery;  Laterality: N/A;  . COCHLEAR IMPLANT Left 2005?  . COLONOSCOPY WITH PROPOFOL N/A 01/20/2018   Procedure: COLONOSCOPY WITH PROPOFOL;  Surgeon: Toledo, Benay Pike, MD;  Location: [facility];  Service: Gastroenterology;  Laterality: N/A;  . CORONARY ANGIOPLASTY    . CORONARY ARTERY BYPASS GRAFT N/A 04/25/2015   Procedure: CORONARY ARTERY BYPASS GRAFTING (CABG) x ONE, using left internal mammary artery;  Surgeon: Rexene Alberts, MD;  Location: [place];  Service: Open Heart Surgery;  Laterality: N/A;  . DILATION AND CURETTAGE OF UTERUS  "several before hysterectomy"  . EP IMPLANTABLE DEVICE N/A 05/02/2015    Procedure: Pacemaker Implant;  Surgeon: Thompson Grayer, MD;  Location: [place] [facility];  Service: Cardiovascular;  Laterality: N/A;  . ESOPHAGOGASTRODUODENOSCOPY (EGD) WITH PROPOFOL N/A 01/20/2018   Procedure: ESOPHAGOGASTRODUODENOSCOPY (EGD) WITH PROPOFOL;  Surgeon: Toledo, Benay Pike, MD;  Location: [facility];  Service: Gastroenterology;  Laterality: N/A;  . EYE SURGERY    . I&D EXTREMITY Right 12/06/2015   Procedure: IRRIGATION AND DEBRIDEMENT RIGHT CHEST WALL WITH ACELL PLACEMENT AND VAC;  Surgeon: Loel Lofty Dillingham, DO;  Location: [place];  Service: Plastics;  Laterality: Right;  . INCISION AND DRAINAGE OF WOUND N/A 11/21/2015   Procedure: IRRIGATION AND DEBRIDEMENT WOUND;  Surgeon: Loel Lofty Dillingham, DO;  Location: [place];  Service: Plastics;  Laterality: N/A;  . INCISION AND DRAINAGE OF WOUND Right 12/21/2015   Procedure: IRRIGATION AND DEBRIDEMENT right chest wall  WOUND;  Surgeon: Loel Lofty Dillingham, DO;  Location: [place];  Service: Plastics;  Laterality: Right;  right chest wall   . INCISION AND DRAINAGE OF WOUND Right 01/24/2016   Procedure: IRRIGATION AND DEBRIDEMENT RIGHT CHEST WALL WOUND;  Surgeon: Loel Lofty Dillingham, DO;  Location: [place];  Service: Plastics;  Laterality: Right;  . INCISION AND DRAINAGE OF WOUND Right 03/28/2016   Procedure: IRRIGATION AND DEBRIDEMENT RIGHT CHEST WALL WOUND WITH A Cell Placement;  Surgeon: Loel Lofty Dillingham, DO;  Location: [place];  Service: Plastics;  Laterality: Right;  . INCISION AND DRAINAGE OF WOUND Right 05/17/2016   Procedure: IRRIGATION AND DEBRIDEMENT OF RIGHT CHEST WOUND WITH A CELL PLACEMENT;  Surgeon: Loel Lofty Dillingham, DO;  Location: [place];  Service: Plastics;  Laterality: Right;  . INSERT / REPLACE / REMOVE PACEMAKER    . IRRIGATION AND DEBRIDEMENT OF WOUND WITH SPLIT THICKNESS SKIN GRAFT Right 01/11/2016   Procedure: IRRIGATION AND DEBRIDEMENT OF RIGHT CHEST WOUND ;  Surgeon: Loel Lofty Dillingham, DO;  Location: [place];  Service: Plastics;   Laterality: Right;  . MASTECTOMY, PARTIAL Right 2002   positive  . MAZE N/A 04/25/2015   Procedure: MAZE;  Surgeon: Rexene Alberts, MD;  Location: [place];  Service: Open Heart Surgery;  Laterality: N/A;  . MITRAL VALVE REPAIR N/A 04/25/2015   Procedure: MITRAL VALVE  REPLACEMENT using a 27 mm Edwards Perimount Magna Mitral Ease Valve;  Surgeon: Rexene Alberts, MD;  Location: [place];  Service: Open Heart Surgery;  Laterality: N/A;  . SKIN SPLIT GRAFT Right 02/12/2016   Procedure: IRRIGATION AND DEBRIDEMENT RIGHT CHEST WOUND;  Surgeon: Loel Lofty Dillingham, DO;  Location: [place];  Service: Plastics;  Laterality: Right;  . STERNAL WIRES REMOVAL N/A 06/05/2015   Procedure: STERNAL WIRES REMOVAL;  Surgeon: Rexene Alberts, MD;  Location: [place];  Service: Thoracic;  Laterality: N/A;  . STERNAL WOUND DEBRIDEMENT N/A 05/09/2015   Procedure: STERNAL WOUND DEBRIDEMENT;  Surgeon: Rexene Alberts, MD;  Location: [place];  Service: Thoracic;  Laterality: N/A;  . STERNAL WOUND DEBRIDEMENT N/A 11/13/2015   Procedure: Excisional drainage of RIGHT Chest wall mass and breast mass ;  Surgeon: Rexene Alberts, MD;  Location: [place];  Service: Thoracic;  Laterality: N/A;  . TEE WITHOUT CARDIOVERSION N/A 04/25/2015   Procedure: TRANSESOPHAGEAL ECHOCARDIOGRAM (TEE);  Surgeon: Rexene Alberts, MD;  Location: [place];  Service: Open Heart Surgery;  Laterality: N/A;  . TONSILLECTOMY    . TRAM Right 11/18/2015   Procedure: VRAM Vertical Rectus Abdominus Muscle Flap;  Surgeon: Loel Lofty Dillingham, DO;  Location: [place];  Service: Plastics;  Laterality: Right;  RIght Back  . TRICUSPID VALVE REPLACEMENT N/A 04/25/2015   Procedure: TRICUSPID VALVE REPAIR;  Surgeon: Rexene Alberts, MD;  Location: [place];  Service: Open Heart Surgery;  Laterality: N/A;  . VAGINAL HYSTERECTOMY      SOCIAL HISTORY:   Social History   Tobacco Use  . Smoking status: Former Smoker    Packs/day: 1.50    Years: 40.00    Pack years: 60.00    Types:  Cigarettes    Last attempt to quit: 04/20/1998    Years since quitting: 20.2  . Smokeless tobacco: Never Used  Substance Use Topics  . Alcohol use: Yes    Comment: 11/10/2015 "glass of wine a few times/year, if that"    FAMILY HISTORY:   Family History  Problem Relation Age of Onset  . Heart  disease Father   . Heart disease Brother   . CVA Mother   . Heart attack Paternal Uncle   . Breast cancer Maternal Aunt   . Breast cancer Paternal Aunt     DRUG ALLERGIES:   Allergies  Allergen Reactions  . Ace Inhibitors Cough and Other (See Comments)  . Amoxicillin-Pot Clavulanate Swelling and Other (See Comments)    JOINTS, PAIN Joint pain   . Penicillins Swelling and Other (See Comments)    SWOLLEN JOINTS  Has patient had a PCN reaction causing immediate rash, facial/tongue/throat swelling, SOB or lightheadedness with hypotension: Yes Has patient had a PCN reaction causing severe rash involving mucus membranes or skin necrosis: No Has patient had a PCN reaction that required hospitalization No Has patient had a PCN reaction occurring within the last 10 years: No If all of the above answers are "NO", then may proceed with Cephalosporin use.  Swollen joints  . Nifedipine Other (See Comments)    UNSPECIFIED UNSPECIFIED   . Propranolol Other (See Comments)    UNSPECIFIED UNSPECIFIED   . Diazepam Other (See Comments)    Made her "hyper" Made her "hyper" Made her "hyper"   . Meperidine Other (See Comments)    Made her "hyper" Made her "hyper" Made her "hyper"   . Propoxyphene Other (See Comments)    Made her "hyper" Made her "hyper" Made her "hyper"     REVIEW OF SYSTEMS:  CONSTITUTIONAL: No fever, chills or sweats.  Positive weakness. EYES: No blurred or double vision.  Wears glasses. EARS, NOSE, AND THROAT: No tinnitus or ear pain. No sore throat.  Decreased hearing.  Positive for runny nose. RESPIRATORY: Some cough, some shortness of breath, occasional wheezing.no  hemoptysis.  CARDIOVASCULAR: No chest pain.  Some edema.  GASTROINTESTINAL: No nausea, vomiting, diarrhea or abdominal pain. No blood in bowel movements GENITOURINARY: No dysuria, hematuria.  ENDOCRINE: No polyuria, nocturia,  HEMATOLOGY: No anemia, easy bruising or bleeding SKIN: No rash or lesion. MUSCULOSKELETAL: Positive for muscle pain NEUROLOGIC: No tingling, numbness, weakness.  PSYCHIATRY: No anxiety or depression.   MEDICATIONS AT HOME:   Prior to Admission medications   Medication Sig Start Date End Date Taking? Authorizing Provider  acetaminophen (TYLENOL) 325 MG tablet Take 2 tablets (650 mg total) by mouth every 4 (four) hours as needed for mild pain (or Fever >/= 101). 05/11/15  Yes Barrett, Erin R, PA-C  AMBULATORY NON FORMULARY MEDICATION [facility] at Home Oxygen machine with tubing. DX: COPD DX Code: J44.9 01/15/17  Yes Wilhelmina Mcardle, MD  aspirin EC 81 MG EC tablet Take 1 tablet (81 mg total) by mouth daily. 05/11/15  Yes Barrett, Erin R, PA-C  azelastine (ASTELIN) 0.1 % nasal spray Place 1 spray into both nostrils 2 (two) times daily as needed for rhinitis or allergies.    Yes [provider]  bisoprolol-hydrochlorothiazide (ZIAC) 10-6.25 MG tablet Take 0.5 tablets by mouth daily.    Yes [provider]  ferrous sulfate 325 (65 FE) MG tablet Take 325 mg by mouth daily with breakfast.   Yes [provider]  fluticasone (FLONASE) 50 MCG/ACT nasal spray Place 1 spray into both nostrils daily as needed for allergies.    Yes [provider]  furosemide (LASIX) 20 MG tablet Take 20 mg by mouth 2 (two) times daily.    Yes [provider]  gabapentin (NEURONTIN) 100 MG capsule Take 200 mg by mouth at bedtime.  09/29/15  Yes [provider]  ipratropium-albuterol (DUONEB)  0.5-2.5 (3) MG/3ML SOLN Take 3 mLs by nebulization every 6 (six) hours as needed. DX:J44.9 Patient taking differently: Take 3 mLs by nebulization every 6 (six) hours  as needed (for wheezing/shortness of breath).  10/28/17  Yes Kasa, Maretta Bees, MD  levothyroxine (SYNTHROID, LEVOTHROID) 88 MCG tablet Take 88 mcg by mouth daily before breakfast.   Yes [provider]  losartan (COZAAR) 100 MG tablet Take 1 tablet (100 mg total) by mouth daily. 08/31/15  Yes Wellington Hampshire, MD  Multiple Vitamins-Minerals (PRESERVISION AREDS 2 PO) Take 2 tablets by mouth daily.   Yes [provider]  pantoprazole (PROTONIX) 40 MG tablet Take 40 mg by mouth 2 (two) times daily.    Yes [provider]  potassium chloride SA (K-DUR,KLOR-CON) 20 MEQ tablet Take 20 mEq by mouth daily. 09/27/15  Yes [provider]  simvastatin (ZOCOR) 20 MG tablet Take 20 mg by mouth at bedtime.    Yes [provider]  warfarin (COUMADIN) 2.5 MG tablet TAKE AS DIRECTED BY COUMADIN CLINIC Patient taking differently: Take 2.5-5 mg by mouth See admin instructions. 5 mg at bedtime on Monday, then 3.75 mg at bedtime on Tuesday, Wednesday, Thursday, Friday, Saturday, and Sunday 07/06/18  Yes Wellington Hampshire, MD      VITAL SIGNS:  Blood pressure 135/81, pulse (!) 49, resp. rate (!) 26, height 5' (1.524 m), weight 59 kg, SpO2 92 %. The patient's heart rate is 130.  Cannot trust a heart rate in the computer on a atrial fibrillation patient.  This heart rate that is listed as a vital sign is from the blood pressure cuff. PHYSICAL EXAMINATION:  GENERAL:  83 y.o.-year-old patient lying in the bed with no acute distress.  EYES: Pupils equal, round, reactive to light and accommodation. No scleral icterus. Extraocular muscles intact.  HEENT: Head atraumatic, normocephalic. Oropharynx and nasopharynx clear.  NECK:  Supple, no jugular venous distention. No thyroid enlargement, no tenderness.  LUNGS: Decreased breath sounds bilaterally, no wheezing, rales,rhonchi or crepitation. No use of accessory muscles of respiration.  CARDIOVASCULAR: S1, S2 irregular regular  tachycardic.  2 out of 6 systolic murmurs.no rubs, or gallops.  ABDOMEN: Soft, nontender, nondistended. Bowel sounds present. No organomegaly or mass.  EXTREMITIES: No pedal edema, cyanosis, or clubbing.  NEUROLOGIC: Cranial nerves II through XII are intact. Muscle strength 5/5 in all extremities. Sensation intact. Gait not checked.  PSYCHIATRIC: The patient is alert and oriented x 3.  SKIN: No rash, lesion, or ulcer.   LABORATORY PANEL:   CBC Recent Labs  Lab 07/17/18 1610  WBC 7.5  HGB 10.8*  HCT 34.8*  PLT 248   ------------------------------------------------------------------------------------------------------------------  Chemistries  Recent Labs  Lab 07/17/18 1610  NA 142  K 4.1  CL 102  CO2 32  GLUCOSE 179*  BUN 25*  CREATININE 1.06*  CALCIUM 9.2   ------------------------------------------------------------------------------------------------------------------  Cardiac Enzymes Recent Labs  Lab 07/17/18 1844  TROPONINI <0.03   ------------------------------------------------------------------------------------------------------------------  RADIOLOGY:  Dg Chest 2 View  Result Date: 07/17/2018 CLINICAL DATA:  AFib and difficulty with ambulation. EXAM: CHEST - 2 VIEW COMPARISON:  10/06/2017 FINDINGS: Sternotomy wires, prosthetic heart valve and left pacemaker unchanged. Lungs are adequately inflated without focal lobar consolidation or effusion. Minimal prominence of the perihilar markings which may be due to mild chronic vascular congestion. Stable calcified granuloma over the right midlung. Mild stable cardiomegaly. Remainder the exam is unchanged. IMPRESSION: Mild stable cardiomegaly with suggestion of minimal chronic vascular congestion. Electronically Signed   By: Quillian Quince  Derrel Nip M.D.   On: 07/17/2018 16:46    EKG:   Atrial fibrillation 98 bpm right superior axis deviation incomplete right bundle branch block Q waves septally.  IMPRESSION AND PLAN:    1.  Atrial fibrillation with rapid ventricular response.  Start amiodarone drip.  Continue bisoprolol and give a dose this evening.  Cardiology consultation.  ER physician spoke with cardiology and they recommended amiodarone if the patient goes fast.  The patient does have allergies to beta-blocker and calcium channel blockers already.  The patient does take bisoprolol.  Therapeutic on Coumadin. 2.  Acute on chronic systolic congestive heart failure.Lasix 40 mg IV daily.  Continue bisoprolol.  Continue losartan. 3.  COPD.  Continue DuoNeb nebulizer solution standing dose.  Try to hold off on steroids at this point.  Oxygen at night for sleep apnea. 4.  Sleep apnea.  Oxygen at night 5.  History of coronary artery disease 6.  Hyperlipidemia unspecified on Zocor 7.  GERD on Protonix 8.  Hypothyroidism unspecified on Synthroid    All the records are reviewed and case discussed with ED provider. Management plans discussed with the patient, family and they are in agreement.  CODE STATUS: DNR  TOTAL TIME TAKING CARE OF THIS PATIENT: 50 minutes, including acp time.    Loletha Grayer M.D on 07/17/2018 at 8:05 PM  Between 7am to 6pm - Pager - [phone]  After 6pm call admission pager [phone]  [facility]  [phone]  CC: Primary care physician; Ezequiel Kayser, MD

## 2018-07-17 NOTE — ED Provider Notes (Signed)
[facility] Emergency Department Provider Note ____________________________________________   First MD Initiated Contact with Patient 07/17/18 1935     (approximate)  I have reviewed the triage vital signs and the nursing notes.   HISTORY  Chief Complaint Abnormal ECG    HPI [name] is a 83 y.o. female with PMH as noted below including history of paroxysmal atrial fibrillation who presents after she was referred in by her cardiologist for rapid atrial fibrillation and concern for fluid overload.  The patient states that over the last 3 weeks she has been increasingly shortness of breath, has had palpitations and shortness of breath with minimal exertion, and has had some generalized weakness.  She reports some associated peripheral swelling.  She denies chest pain.  She saw the cardiologist today and was referred to the ED for further management and admission.  Past Medical History:  Diagnosis Date  . Anxiety   . Arthritis    "hands" (11/10/2015)  . CAD s/p CABG x 1    a. 04/2015 LIM[place] to LAD  . Chronic combined systolic (congestive) and diastolic (congestive) heart failure ([place])    a. 01/2015 Echo: EF 50-55%, Gr2 DD (preop valve surgery); b. 08/2015 Echo: EF 30-35%, diff HK. Gr2 DD; c. 09/2016 Echo: EF 50-55%. Nl fxning TV ring. Mildly dil LA.  Marland Kitchen COPD (chronic obstructive pulmonary disease) ([place])   . Coronary artery disease   . GERD (gastroesophageal reflux disease)   . History of colon polyps   . History of mitral valve replacement    a. 04/2015 s/p 27 mm [facility] Mitral bovine bioprosthetic tissue valve  . Hyperlipidemia   . Hypertension   . Hypothyroidism   . 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 04/2015 Complete bilateral atrial lesion set using cryothermy and bipolar radiofrequency ablation with clipping of LA appendage  . Meniere disease   . Meniere's disease   . On home oxygen therapy    "2L at night" (11/10/2015)  . PAF (paroxysmal  atrial fibrillation) ([place])   . Pneumonia ~ 2010  . PONV (postoperative nausea and vomiting)    Pt felt like eardrums were gonna pop  . Post-surgical complete heart block, symptomatic    a. 04/2015 s/p MDT [id number] Advisa DR MRI DC PPM.  . Presence of permanent cardiac pacemaker   . Pulmonary hypertension ([place])   . Surgical wound, non healing - chest wall 11/10/2015  . Tricuspid Regurgitation s/p Repair    a. 04/2015 s/p 26 mm Edwards mc3 ring annuloplasty  . Wound infection 10/13/2015   Superficial sternal wound infection    Patient Active Problem List   Diagnosis Date Noted  . Rapid atrial fibrillation ([place]) 07/17/2018  . Acute respiratory failure with hypoxia ([place]) 06/29/2016  . Surgical wound, non healing - chest wall 11/10/2015  . Chronic diastolic CHF (congestive heart failure) ([place])   . S/P placement of cardiac pacemaker 05/02/15, medtronic 05/03/2015  . Bradycardia 05/03/2015  . Post-surgical complete heart block, symptomatic 05/03/2015  . S/P mitral valve replacement with bioprosthetic valve, tricuspid valve repair, maze procedure and CABG x1 04/25/2015  . S/P tricuspid valve repair 04/25/2015  . S/P Maze operation for atrial fibrillation 04/25/2015  . S/P CABG x 1 04/25/2015  . Coronary artery disease   . Severe mitral regurgitation 03/09/2015  . Chronic systolic congestive heart failure ([place]) 03/09/2015  . Pulmonary hypertension ([place])   . Tricuspid regurgitation   . Chronic combined systolic and diastolic CHF (  congestive heart failure) ([place])   . Atrial fibrillation, unspecified 11/10/2014  . Chronic systolic heart failure ([place]) 11/10/2014  . Essential hypertension 11/10/2014  . Mitral regurgitation 11/10/2014  . Hyperlipidemia 11/10/2014  . Disorder of mitral valve 11/10/2014  . Atrial fibrillation ([place]) 11/10/2014  . Acid reflux 05/10/2014  . Type 2 diabetes mellitus ([place]) 05/08/2014  . Auditory vertigo 05/08/2014  . Adult hypothyroidism 05/08/2014  .  Hypercholesterolemia 05/08/2014  . H/O adenomatous polyp of colon 05/08/2014  . Benign essential HTN 05/08/2014  . Moderate COPD (chronic obstructive pulmonary disease) ([place]) 03/28/2014    Past Surgical History:  Procedure Laterality Date  . APPENDECTOMY    . APPLICATION OF A-CELL OF CHEST/ABDOMEN N/A 11/21/2015   Procedure: APPLICATION OF A-CELL OF CHEST/ABDOMEN;  Surgeon: Loel Lofty Dillingham, DO;  Location: [place];  Service: Plastics;  Laterality: N/A;  . APPLICATION OF A-CELL OF CHEST/ABDOMEN Right 12/21/2015   Procedure: APPLICATION OF A-CELL OF RIGHT CHEST;  Surgeon: Loel Lofty Dillingham, DO;  Location: [place];  Service: Plastics;  Laterality: Right;  . APPLICATION OF A-CELL OF CHEST/ABDOMEN Right 01/11/2016   Procedure: APPLICATION OF A-CELL OF RIGHT CHEST;  Surgeon: Loel Lofty Dillingham, DO;  Location: [place];  Service: Plastics;  Laterality: Right;  . APPLICATION OF A-CELL OF CHEST/ABDOMEN Right 02/12/2016   Procedure: APPLICATION OF A-CELL TO RIGHT CHEST WOUND;  Surgeon: Loel Lofty Dillingham, DO;  Location: [place];  Service: Plastics;  Laterality: Right;  . APPLICATION OF WOUND VAC N/A 05/09/2015   Procedure: APPLICATION OF WOUND VAC;  Surgeon: Rexene Alberts, MD;  Location: [place];  Service: Thoracic;  Laterality: N/A;  . APPLICATION OF WOUND VAC N/A 11/13/2015   Procedure: APPLICATION OF WOUND VAC;  Surgeon: Rexene Alberts, MD;  Location: [place];  Service: Thoracic;  Laterality: N/A;  . APPLICATION OF WOUND VAC N/A 11/21/2015   Procedure: APPLICATION OF WOUND VAC;  Surgeon: Loel Lofty Dillingham, DO;  Location: [place];  Service: Plastics;  Laterality: N/A;  . APPLICATION OF WOUND VAC Right 12/21/2015   Procedure: APPLICATION OF WOUND VAC to right chest wall ;  Surgeon: Loel Lofty Dillingham, DO;  Location: [place];  Service: Plastics;  Laterality: Right;  . APPLICATION OF WOUND VAC Right 01/11/2016   Procedure: APPLICATION OF WOUND VAC RIGHT CHEST ;  Surgeon: Loel Lofty Dillingham, DO;  Location: [place];   Service: Plastics;  Laterality: Right;  . APPLICATION OF WOUND VAC Right 01/24/2016   Procedure: APPLICATION OF WOUND VAC RIGHT CHEST WALL;  Surgeon: Loel Lofty Dillingham, DO;  Location: [place];  Service: Plastics;  Laterality: Right;  . APPLICATION OF WOUND VAC Right 02/12/2016   Procedure: RE-APPLICATION OF WOUND VAC TO RIGHT CHEST WOUND;  Surgeon: Loel Lofty Dillingham, DO;  Location: [place];  Service: Plastics;  Laterality: Right;  . BREAST BIOPSY Left 11/25/06   neg  . BREAST BIOPSY Left 01/20/12   /clip-neg  . CARDIAC CATHETERIZATION  11/2013   [facility]  . CARDIAC CATHETERIZATION  10/2014   [facility]  . CARDIAC VALVE REPLACEMENT    . CATARACT EXTRACTION W/ INTRAOCULAR LENS  IMPLANT, BILATERAL Bilateral 2014  . CLIPPING OF ATRIAL APPENDAGE N/A 04/25/2015   Procedure: CLIPPING OF ATRIAL APPENDAGE;  Surgeon: Rexene Alberts, MD;  Location: [place];  Service: Open Heart Surgery;  Laterality: N/A;  . COCHLEAR IMPLANT Left 2005?  . COLONOSCOPY WITH PROPOFOL N/A 01/20/2018   Procedure: COLONOSCOPY WITH PROPOFOL;  Surgeon: Toledo, Benay Pike, MD;  Location: [facility];  Service: Gastroenterology;  Laterality: N/A;  . CORONARY ANGIOPLASTY    . CORONARY ARTERY BYPASS GRAFT N/A 04/25/2015   Procedure: CORONARY ARTERY BYPASS GRAFTING (CABG) x ONE, using left internal mammary artery;  Surgeon: Rexene Alberts, MD;  Location: [place];  Service: Open Heart Surgery;  Laterality: N/A;  . DILATION AND CURETTAGE OF UTERUS  "several before hysterectomy"  . EP IMPLANTABLE DEVICE N/A 05/02/2015   Procedure: Pacemaker Implant;  Surgeon: Thompson Grayer, MD;  Location: [place] [facility];  Service: Cardiovascular;  Laterality: N/A;  . ESOPHAGOGASTRODUODENOSCOPY (EGD) WITH PROPOFOL N/A 01/20/2018   Procedure: ESOPHAGOGASTRODUODENOSCOPY (EGD) WITH PROPOFOL;  Surgeon: Toledo, Benay Pike, MD;  Location: [facility];  Service: Gastroenterology;  Laterality: N/A;  . EYE SURGERY    . I&D EXTREMITY Right 12/06/2015   Procedure: IRRIGATION  AND DEBRIDEMENT RIGHT CHEST WALL WITH ACELL PLACEMENT AND VAC;  Surgeon: Loel Lofty Dillingham, DO;  Location: [place];  Service: Plastics;  Laterality: Right;  . INCISION AND DRAINAGE OF WOUND N/A 11/21/2015   Procedure: IRRIGATION AND DEBRIDEMENT WOUND;  Surgeon: Loel Lofty Dillingham, DO;  Location: [place];  Service: Plastics;  Laterality: N/A;  . INCISION AND DRAINAGE OF WOUND Right 12/21/2015   Procedure: IRRIGATION AND DEBRIDEMENT right chest wall WOUND;  Surgeon: Loel Lofty Dillingham, DO;  Location: [place];  Service: Plastics;  Laterality: Right;  right chest wall   . INCISION AND DRAINAGE OF WOUND Right 01/24/2016   Procedure: IRRIGATION AND DEBRIDEMENT RIGHT CHEST WALL WOUND;  Surgeon: Loel Lofty Dillingham, DO;  Location: [place];  Service: Plastics;  Laterality: Right;  . INCISION AND DRAINAGE OF WOUND Right 03/28/2016   Procedure: IRRIGATION AND DEBRIDEMENT RIGHT CHEST WALL WOUND WITH A Cell Placement;  Surgeon: Loel Lofty Dillingham, DO;  Location: [place];  Service: Plastics;  Laterality: Right;  . INCISION AND DRAINAGE OF WOUND Right 05/17/2016   Procedure: IRRIGATION AND DEBRIDEMENT OF RIGHT CHEST WOUND WITH A CELL PLACEMENT;  Surgeon: Loel Lofty Dillingham, DO;  Location: [place];  Service: Plastics;  Laterality: Right;  . INSERT / REPLACE / REMOVE PACEMAKER    . IRRIGATION AND DEBRIDEMENT OF WOUND WITH SPLIT THICKNESS SKIN GRAFT Right 01/11/2016   Procedure: IRRIGATION AND DEBRIDEMENT OF RIGHT CHEST WOUND ;  Surgeon: Loel Lofty Dillingham, DO;  Location: [place];  Service: Plastics;  Laterality: Right;  . MASTECTOMY, PARTIAL Right 2002   positive  . MAZE N/A 04/25/2015   Procedure: MAZE;  Surgeon: Rexene Alberts, MD;  Location: [place];  Service: Open Heart Surgery;  Laterality: N/A;  . MITRAL VALVE REPAIR N/A 04/25/2015   Procedure: MITRAL VALVE  REPLACEMENT using a 27 mm Edwards Perimount Magna Mitral Ease Valve;  Surgeon: Rexene Alberts, MD;  Location: [place];  Service: Open Heart Surgery;  Laterality: N/A;  .  SKIN SPLIT GRAFT Right 02/12/2016   Procedure: IRRIGATION AND DEBRIDEMENT RIGHT CHEST WOUND;  Surgeon: Loel Lofty Dillingham, DO;  Location: [place];  Service: Plastics;  Laterality: Right;  . STERNAL WIRES REMOVAL N/A 06/05/2015   Procedure: STERNAL WIRES REMOVAL;  Surgeon: Rexene Alberts, MD;  Location: [place];  Service: Thoracic;  Laterality: N/A;  . STERNAL WOUND DEBRIDEMENT N/A 05/09/2015   Procedure: STERNAL WOUND DEBRIDEMENT;  Surgeon: Rexene Alberts, MD;  Location: [place];  Service: Thoracic;  Laterality: N/A;  . STERNAL WOUND DEBRIDEMENT N/A 11/13/2015   Procedure: Excisional drainage of RIGHT Chest wall mass and breast mass ;  Surgeon: Rexene Alberts, MD;  [facility];  Service: Thoracic;  Laterality: N/A;  . TEE WITHOUT CARDIOVERSION N/A 04/25/2015   Procedure: TRANSESOPHAGEAL ECHOCARDIOGRAM (TEE);  Surgeon: Rexene Alberts, MD;  Location: [place];  Service: Open Heart Surgery;  Laterality: N/A;  . TONSILLECTOMY    . TRAM Right 11/18/2015   Procedure: VRAM Vertical Rectus Abdominus Muscle Flap;  Surgeon: Loel Lofty Dillingham, DO;  Location: [place];  Service: Plastics;  Laterality: Right;  RIght Back  . TRICUSPID VALVE REPLACEMENT N/A 04/25/2015   Procedure: TRICUSPID VALVE REPAIR;  Surgeon: Rexene Alberts, MD;  Location: [place];  Service: Open Heart Surgery;  Laterality: N/A;  . VAGINAL HYSTERECTOMY      Prior to Admission medications   Medication Sig Start Date End Date Taking? Authorizing Provider  acetaminophen (TYLENOL) 325 MG tablet Take 2 tablets (650 mg total) by mouth every 4 (four) hours as needed for mild pain (or Fever >/= 101). 05/11/15  Yes Barrett, Erin R, PA-C  AMBULATORY NON FORMULARY MEDICATION Inogen at Home Oxygen machine with tubing. DX: COPD DX Code: J44.9 01/15/17  Yes Wilhelmina Mcardle, MD  aspirin EC 81 MG EC tablet Take 1 tablet (81 mg total) by mouth daily. 05/11/15  Yes Barrett, Erin R, PA-C  azelastine (ASTELIN) 0.1 % nasal spray Place 1 spray into both nostrils 2 (two)  times daily as needed for rhinitis or allergies.    Yes [provider]  bisoprolol-hydrochlorothiazide (ZIAC) 10-6.25 MG tablet Take 0.5 tablets by mouth daily.    Yes [provider]  ferrous sulfate 325 (65 FE) MG tablet Take 325 mg by mouth daily with breakfast.   Yes [provider]  fluticasone (FLONASE) 50 MCG/ACT nasal spray Place 1 spray into both nostrils daily as needed for allergies.    Yes [provider]  furosemide (LASIX) 20 MG tablet Take 20 mg by mouth 2 (two) times daily.    Yes [provider]  gabapentin (NEURONTIN) 100 MG capsule Take 200 mg by mouth at bedtime.  09/29/15  Yes [provider]  ipratropium-albuterol (DUONEB) 0.5-2.5 (3) MG/3ML SOLN Take 3 mLs by nebulization every 6 (six) hours as needed. DX:J44.9 Patient taking differently: Take 3 mLs by nebulization every 6 (six) hours as needed (for wheezing/shortness of breath).  10/28/17  Yes Kasa, Maretta Bees, MD  levothyroxine (SYNTHROID, LEVOTHROID) 88 MCG tablet Take 88 mcg by mouth daily before breakfast.   Yes [provider]  losartan (COZAAR) 100 MG tablet Take 1 tablet (100 mg total) by mouth daily. 08/31/15  Yes Wellington Hampshire, MD  Multiple Vitamins-Minerals (PRESERVISION AREDS 2 PO) Take 2 tablets by mouth daily.   Yes [provider]  pantoprazole (PROTONIX) 40 MG tablet Take 40 mg by mouth 2 (two) times daily.    Yes [provider]  potassium chloride SA (K-DUR,KLOR-CON) 20 MEQ tablet Take 20 mEq by mouth daily. 09/27/15  Yes [provider]  simvastatin (ZOCOR) 20 MG tablet Take 20 mg by mouth at bedtime.    Yes [provider]  warfarin (COUMADIN) 2.5 MG tablet TAKE AS DIRECTED BY COUMADIN CLINIC Patient taking differently: Take 2.5-5 mg by mouth See admin instructions. 5 mg at bedtime on Monday, then 3.75 mg at bedtime on Tuesday, Wednesday, Thursday, Friday, Saturday, and Sunday 07/06/18  Yes Wellington Hampshire, MD      Allergies Ace inhibitors; Amoxicillin-pot clavulanate; Penicillins; Nifedipine; Propranolol; Diazepam; Meperidine; and Propoxyphene  Family History  Problem Relation Age of Onset  . Heart disease Father   .  Heart disease Brother   . Heart attack Paternal Uncle   . Breast cancer Maternal Aunt   . Breast cancer Paternal Aunt     Social History Social History   Tobacco Use  . Smoking status: Former Smoker    Packs/day: 1.50    Years: 40.00    Pack years: 60.00    Types: Cigarettes    Last attempt to quit: 04/20/1998    Years since quitting: 20.2  . Smokeless tobacco: Never Used  Substance Use Topics  . Alcohol use: Yes    Comment: 11/10/2015 "glass of wine a few times/year, if that"  . Drug use: No    Review of Systems  Constitutional: No fever.  Positive for weakness. Eyes: No redness. ENT: No sore throat. Cardiovascular: Denies chest pain. Respiratory: Positive for shortness of breath. Gastrointestinal: No vomiting.  Genitourinary: Negative for dysuria.  Musculoskeletal: Negative for back pain. Skin: Negative for rash. Neurological: Negative for headache.   ____________________________________________   PHYSICAL EXAM:  VITAL SIGNS: ED Triage Vitals  Enc Vitals Group     BP 07/17/18 1830 135/81     Pulse Rate 07/17/18 1830 (!) 49     Resp 07/17/18 1830 (!) 26     Temp --      Temp src --      SpO2 07/17/18 1830 92 %     Weight 07/17/18 1607 130 lb (59 kg)     Height 07/17/18 1607 5' (1.524 m)     Head Circumference --      Peak Flow --      Pain Score 07/17/18 1831 0     Pain Loc --      Pain Edu? --      Excl. in [place]? --     Constitutional: Alert and oriented. In no acute distress. Eyes: Conjunctivae are normal.  Head: Atraumatic. Nose: No congestion/rhinnorhea. Mouth/Throat: Mucous membranes are moist.   Neck: Normal range of motion.  Cardiovascular: Normal rate, irregular rhythm. Grossly normal heart sounds.  Good peripheral  circulation. Respiratory: Normal respiratory effort.  No retractions.  Decreased breath sounds to bilateral bases. Gastrointestinal:  No distention.  Genitourinary: No flank tenderness. Musculoskeletal: 1+ bilateral lower extremity edema.  Extremities warm and well perfused.  Neurologic:  Normal speech and language. No gross focal neurologic deficits are appreciated.  Skin:  Skin is warm and dry. No rash noted. Psychiatric: Mood and affect are normal. Speech and behavior are normal.  ____________________________________________   LABS (all labs ordered are listed, but only abnormal results are displayed)  Labs Reviewed  BASIC METABOLIC PANEL - Abnormal; Notable for the following components:      Result Value   Glucose, Bld 179 (*)    BUN 25 (*)    Creatinine, Ser 1.06 (*)    GFR calc non Af Amer 48 (*)    GFR calc Af Amer 56 (*)    All other components within normal limits  CBC - Abnormal; Notable for the following components:   RBC 3.52 (*)    Hemoglobin 10.8 (*)    HCT 34.8 (*)    All other components within normal limits  BRAIN NATRIURETIC PEPTIDE - Abnormal; Notable for the following components:   B Natriuretic Peptide 477.0 (*)    All other components within normal limits  PROTIME-INR - Abnormal; Notable for the following components:   Prothrombin Time 23.2 (*)    All other components within normal limits  TROPONIN I   ____________________________________________  EKG  ED ECG REPORT I, Arta Silence, the attending physician, personally viewed and interpreted this ECG.  Date: 07/17/2018 EKG Time: 1608 Rate: 98 Rhythm: Atrial fibrillation QRS Axis: Right superior axis deviation Intervals: normal ST/T Wave abnormalities: normal Narrative Interpretation: no evidence of acute ischemia  ____________________________________________  RADIOLOGY  CXR: Mild vascular congestion  ____________________________________________   PROCEDURES  Procedure(s)  performed: No  Procedures  Critical Care performed: No ____________________________________________   INITIAL IMPRESSION / ASSESSMENT AND PLAN / ED COURSE  Pertinent labs & imaging results that were available during my care of the patient were reviewed by me and considered in my medical decision making (see chart for details).  83 year old female with PMH as noted above including paroxysmal atrial fibrillation presents with persistent atrial fibrillation was found to be rapid when she was at her cardiologist's office today.  I reviewed the past medical records and the cardiology note from today.  The patient was referred to the ED for rate control and diuresis.  On ED arrival the patient's heart rate is now in the 90s, sometimes going up to 110 or 115.  Her other vital signs are normal.  The remainder of the exam is as described above.  EKG shows atrial fibrillation.  I discussed the case with Dr. Harrell Gave from cardiology.  She advises that based on the plan from the patient's cardiologist, we should initially work on diuresis and since the patient is currently rate controlled she would not recommend any additional rate control medication at this time.  She advises that if the patient becomes rapid again we may give amiodarone.  She recommends admission.  I initiated IV Lasix.  The lab work-up is unremarkable.  I signed the patient out to the hospitalist Dr. Earleen Newport.  ____________________________________________   FINAL CLINICAL IMPRESSION(S) / ED DIAGNOSES  Final diagnoses:  Atrial fibrillation, unspecified type ([place])      NEW MEDICATIONS STARTED DURING THIS VISIT:  New Prescriptions   No medications on file     Note:  This document was prepared using Dragon voice recognition software and may include unintentional dictation errors.    Arta Silence, MD 07/17/18 1956

## 2018-07-17 NOTE — Progress Notes (Signed)
Cardiology Clinic Note   Patient Name: [name] Date of Encounter: 07/17/2018  Primary Care Provider:  Ezequiel Kayser, MD Primary Cardiologist:  Kathlyn Sacramento, MD  Patient Profile    83 year old female with a history of paroxysmal atrial fibrillation, mitral valve stenosis and tricuspid regurgitation status post MV replacement/TVR/maze/left atrial appendage clipping, and CAD status post CABG x1, who presents for follow-up related to a 3-week history of progressive dyspnea and lower extremity swelling.  Past Medical History    Past Medical History:  Diagnosis Date  . Anxiety   . Arthritis    "hands" (11/10/2015)  . CAD s/p CABG x 1    a. 04/2015 LIMA to LAD  . Chronic combined systolic (congestive) and diastolic (congestive) heart failure ([place])    a. 01/2015 Echo: EF 50-55%, Gr2 DD (preop valve surgery); b. 08/2015 Echo: EF 30-35%, diff HK. Gr2 DD; c. 09/2016 Echo: EF 50-55%. Nl fxning TV ring. Mildly dil LA.  Marland Kitchen COPD (chronic obstructive pulmonary disease) ([place])   . Coronary artery disease   . GERD (gastroesophageal reflux disease)   . History of colon polyps   . History of mitral valve replacement    a. 04/2015 s/p 27 mm [facility] Mitral bovine bioprosthetic tissue valve  . Hyperlipidemia   . Hypertension   . Hypothyroidism   . 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 04/2015 Complete bilateral atrial lesion set using cryothermy and bipolar radiofrequency ablation with clipping of LA appendage  . Meniere disease   . Meniere's disease   . On home oxygen therapy    "2L at night" (11/10/2015)  . PAF (paroxysmal atrial fibrillation) ([place])   . Pneumonia ~ 2010  . PONV (postoperative nausea and vomiting)    Pt felt like eardrums were gonna pop  . Post-surgical complete heart block, symptomatic    a. 04/2015 s/p MDT [id number] Advisa DR MRI DC PPM.  . Presence of permanent cardiac pacemaker   . Pulmonary hypertension ([place])   . Surgical wound, non healing - chest wall 11/10/2015    . Tricuspid Regurgitation s/p Repair    a. 04/2015 s/p 26 mm Edwards mc3 ring annuloplasty  . Wound infection 10/13/2015   Superficial sternal wound infection   Past Surgical History:  Procedure Laterality Date  . APPENDECTOMY    . APPLICATION OF A-CELL OF CHEST/ABDOMEN N/A 11/21/2015   Procedure: APPLICATION OF A-CELL OF CHEST/ABDOMEN;  Surgeon: Loel Lofty Dillingham, DO;  Location: [place];  Service: Plastics;  Laterality: N/A;  . APPLICATION OF A-CELL OF CHEST/ABDOMEN Right 12/21/2015   Procedure: APPLICATION OF A-CELL OF RIGHT CHEST;  Surgeon: Loel Lofty Dillingham, DO;  Location: [place];  Service: Plastics;  Laterality: Right;  . APPLICATION OF A-CELL OF CHEST/ABDOMEN Right 01/11/2016   Procedure: APPLICATION OF A-CELL OF RIGHT CHEST;  Surgeon: Loel Lofty Dillingham, DO;  Location: [place];  Service: Plastics;  Laterality: Right;  . APPLICATION OF A-CELL OF CHEST/ABDOMEN Right 02/12/2016   Procedure: APPLICATION OF A-CELL TO RIGHT CHEST WOUND;  Surgeon: Loel Lofty Dillingham, DO;  Location: [place];  Service: Plastics;  Laterality: Right;  . APPLICATION OF WOUND VAC N/A 05/09/2015   Procedure: APPLICATION OF WOUND VAC;  Surgeon: Rexene Alberts, MD;  Location: [place];  Service: Thoracic;  Laterality: N/A;  . APPLICATION OF WOUND VAC N/A 11/13/2015   Procedure: APPLICATION OF WOUND VAC;  Surgeon: Rexene Alberts, MD;  Location: [place];  Service: Thoracic;  Laterality: N/A;  .  APPLICATION OF WOUND VAC N/A 11/21/2015   Procedure: APPLICATION OF WOUND VAC;  Surgeon: Loel Lofty Dillingham, DO;  Location: [place];  Service: Plastics;  Laterality: N/A;  . APPLICATION OF WOUND VAC Right 12/21/2015   Procedure: APPLICATION OF WOUND VAC to right chest wall ;  Surgeon: Loel Lofty Dillingham, DO;  Location: [place];  Service: Plastics;  Laterality: Right;  . APPLICATION OF WOUND VAC Right 01/11/2016   Procedure: APPLICATION OF WOUND VAC RIGHT CHEST ;  Surgeon: Loel Lofty Dillingham, DO;  Location: [place];  Service: Plastics;  Laterality:  Right;  . APPLICATION OF WOUND VAC Right 01/24/2016   Procedure: APPLICATION OF WOUND VAC RIGHT CHEST WALL;  Surgeon: Loel Lofty Dillingham, DO;  Location: [place];  Service: Plastics;  Laterality: Right;  . APPLICATION OF WOUND VAC Right 02/12/2016   Procedure: RE-APPLICATION OF WOUND VAC TO RIGHT CHEST WOUND;  Surgeon: Loel Lofty Dillingham, DO;  Location: [place];  Service: Plastics;  Laterality: Right;  . BREAST BIOPSY Left 11/25/06   neg  . BREAST BIOPSY Left 01/20/12   /clip-neg  . CARDIAC CATHETERIZATION  11/2013   [facility]  . CARDIAC CATHETERIZATION  10/2014   [facility]  . CARDIAC VALVE REPLACEMENT    . CATARACT EXTRACTION W/ INTRAOCULAR LENS  IMPLANT, BILATERAL Bilateral 2014  . CLIPPING OF ATRIAL APPENDAGE N/A 04/25/2015   Procedure: CLIPPING OF ATRIAL APPENDAGE;  Surgeon: Rexene Alberts, MD;  Location: [place];  Service: Open Heart Surgery;  Laterality: N/A;  . COCHLEAR IMPLANT Left 2005?  . COLONOSCOPY WITH PROPOFOL N/A 01/20/2018   Procedure: COLONOSCOPY WITH PROPOFOL;  Surgeon: Toledo, Benay Pike, MD;  Location: [facility];  Service: Gastroenterology;  Laterality: N/A;  . CORONARY ANGIOPLASTY    . CORONARY ARTERY BYPASS GRAFT N/A 04/25/2015   Procedure: CORONARY ARTERY BYPASS GRAFTING (CABG) x ONE, using left internal mammary artery;  Surgeon: Rexene Alberts, MD;  Location: [place];  Service: Open Heart Surgery;  Laterality: N/A;  . DILATION AND CURETTAGE OF UTERUS  "several before hysterectomy"  . EP IMPLANTABLE DEVICE N/A 05/02/2015   Procedure: Pacemaker Implant;  Surgeon: Thompson Grayer, MD;  Location: [place] [facility];  Service: Cardiovascular;  Laterality: N/A;  . ESOPHAGOGASTRODUODENOSCOPY (EGD) WITH PROPOFOL N/A 01/20/2018   Procedure: ESOPHAGOGASTRODUODENOSCOPY (EGD) WITH PROPOFOL;  Surgeon: Toledo, Benay Pike, MD;  Location: [facility];  Service: Gastroenterology;  Laterality: N/A;  . EYE SURGERY    . I&D EXTREMITY Right 12/06/2015   Procedure: IRRIGATION AND DEBRIDEMENT RIGHT CHEST WALL  WITH ACELL PLACEMENT AND VAC;  Surgeon: Loel Lofty Dillingham, DO;  Location: [place];  Service: Plastics;  Laterality: Right;  . INCISION AND DRAINAGE OF WOUND N/A 11/21/2015   Procedure: IRRIGATION AND DEBRIDEMENT WOUND;  Surgeon: Loel Lofty Dillingham, DO;  Location: [place];  Service: Plastics;  Laterality: N/A;  . INCISION AND DRAINAGE OF WOUND Right 12/21/2015   Procedure: IRRIGATION AND DEBRIDEMENT right chest wall WOUND;  Surgeon: Loel Lofty Dillingham, DO;  Location: [place];  Service: Plastics;  Laterality: Right;  right chest wall   . INCISION AND DRAINAGE OF WOUND Right 01/24/2016   Procedure: IRRIGATION AND DEBRIDEMENT RIGHT CHEST WALL WOUND;  Surgeon: Loel Lofty Dillingham, DO;  Location: [place];  Service: Plastics;  Laterality: Right;  . INCISION AND DRAINAGE OF WOUND Right 03/28/2016   Procedure: IRRIGATION AND DEBRIDEMENT RIGHT CHEST WALL WOUND WITH A Cell Placement;  Surgeon: Loel Lofty Dillingham, DO;  Location: [place];  Service: Plastics;  Laterality: Right;  .  INCISION AND DRAINAGE OF WOUND Right 05/17/2016   Procedure: IRRIGATION AND DEBRIDEMENT OF RIGHT CHEST WOUND WITH A CELL PLACEMENT;  Surgeon: Loel Lofty Dillingham, DO;  Location: [place];  Service: Plastics;  Laterality: Right;  . INSERT / REPLACE / REMOVE PACEMAKER    . IRRIGATION AND DEBRIDEMENT OF WOUND WITH SPLIT THICKNESS SKIN GRAFT Right 01/11/2016   Procedure: IRRIGATION AND DEBRIDEMENT OF RIGHT CHEST WOUND ;  Surgeon: Loel Lofty Dillingham, DO;  Location: [place];  Service: Plastics;  Laterality: Right;  . MASTECTOMY, PARTIAL Right 2002   positive  . MAZE N/A 04/25/2015   Procedure: MAZE;  Surgeon: Rexene Alberts, MD;  Location: [place];  Service: Open Heart Surgery;  Laterality: N/A;  . MITRAL VALVE REPAIR N/A 04/25/2015   Procedure: MITRAL VALVE  REPLACEMENT using a 27 mm Edwards Perimount Magna Mitral Ease Valve;  Surgeon: Rexene Alberts, MD;  Location: [place];  Service: Open Heart Surgery;  Laterality: N/A;  . SKIN SPLIT GRAFT Right 02/12/2016     Procedure: IRRIGATION AND DEBRIDEMENT RIGHT CHEST WOUND;  Surgeon: Loel Lofty Dillingham, DO;  Location: [place];  Service: Plastics;  Laterality: Right;  . STERNAL WIRES REMOVAL N/A 06/05/2015   Procedure: STERNAL WIRES REMOVAL;  Surgeon: Rexene Alberts, MD;  Location: [place];  Service: Thoracic;  Laterality: N/A;  . STERNAL WOUND DEBRIDEMENT N/A 05/09/2015   Procedure: STERNAL WOUND DEBRIDEMENT;  Surgeon: Rexene Alberts, MD;  Location: [place];  Service: Thoracic;  Laterality: N/A;  . STERNAL WOUND DEBRIDEMENT N/A 11/13/2015   Procedure: Excisional drainage of RIGHT Chest wall mass and breast mass ;  Surgeon: Rexene Alberts, MD;  Location: [place];  Service: Thoracic;  Laterality: N/A;  . TEE WITHOUT CARDIOVERSION N/A 04/25/2015   Procedure: TRANSESOPHAGEAL ECHOCARDIOGRAM (TEE);  Surgeon: Rexene Alberts, MD;  Location: [place];  Service: Open Heart Surgery;  Laterality: N/A;  . TONSILLECTOMY    . TRAM Right 11/18/2015   Procedure: VRAM Vertical Rectus Abdominus Muscle Flap;  Surgeon: Loel Lofty Dillingham, DO;  Location: [place];  Service: Plastics;  Laterality: Right;  RIght Back  . TRICUSPID VALVE REPLACEMENT N/A 04/25/2015   Procedure: TRICUSPID VALVE REPAIR;  Surgeon: Rexene Alberts, MD;  Location: [place];  Service: Open Heart Surgery;  Laterality: N/A;  . VAGINAL HYSTERECTOMY      Allergies  Allergies  Allergen Reactions  . Ace Inhibitors Cough and Other (See Comments)  . Amoxicillin-Pot Clavulanate Swelling and Other (See Comments)    JOINTS, PAIN Joint pain   . Penicillins Swelling and Other (See Comments)    SWOLLEN JOINTS  Has patient had a PCN reaction causing immediate rash, facial/tongue/throat swelling, SOB or lightheadedness with hypotension: Yes Has patient had a PCN reaction causing severe rash involving mucus membranes or skin necrosis: No Has patient had a PCN reaction that required hospitalization No Has patient had a PCN reaction occurring within the last 10 years: No If all of  the above answers are "NO", then may proceed with Cephalosporin use.  Swollen joints  . Nifedipine Other (See Comments)    UNSPECIFIED UNSPECIFIED   . Propranolol Other (See Comments)    UNSPECIFIED UNSPECIFIED   . Diazepam Other (See Comments)    Made her "hyper" Made her "hyper" Made her "hyper"   . Meperidine Other (See Comments)    Made her "hyper" Made her "hyper" Made her "hyper"   . Propoxyphene Other (See Comments)    Made her "hyper" Made her "  hyper" Made her "hyper"     History of Present Illness    83 year old female with the above complex past medical history.  In February 2016, she was diagnosed with paroxysmal atrial for ablation CHF.  EF was initially 35 to 40%.  Catheterization revealed moderate proximal LAD disease with transesophageal echocardiogram revealing moderate tricuspid regurgitation, pulmonary arterial hypertension, and moderate severe mitral regurgitation.  She underwent cardioversion at that time.  Follow-up echocardiogram showed persistent severe mitral regurgitation despite improvement in LV function and she was subsequently referred to thoracic surgery and underwent mitral valve replacement with a bovine valve, tricuspid valve repair, maze, left atrial appendage clipping, and CABG x1 with a LIMA to the LAD.  Postoperative course was comp gated by sternal wound infection and complete heart block requiring [employer]tronic permanent pacemaker.  She was subsequently discharged on amiodarone and Coumadin in the setting of paroxysmal atrial fibrillation and amiodarone was eventually discontinued.  She has been on chronic Coumadin since.  In December 2018, she was seen with complaints of increasing lower external he swelling and abdominal girth.  Follow-up echo again showed LV dysfunction with an EF of 30 to 35%.  She was seen by EP and it was felt that LV dysfunction was in the context of RV apical pacing.  That was inactivated and she noted immediate improvement in  symptoms.  Follow-up echocardiogram in January 2018 showed improvement in LV function with an EF of 50 to 55% and normal functioning valves.  At some point amiodarone was discontinued in the absence of recurrent atrial fibrillation.  She was last seen in clinic by Dr. Fletcher Anon in August, at which time she was doing well.  She saw Dr. Caryl Comes in September, at which time she reported some exercise intolerance and fatigue.  Adjustments were again made to her pacemaker.  She says that she had improvement in exercise tolerance following that however, about 3 weeks ago, she began to note profound dyspnea on exertion and also fatigue.  She also started to notice tachypalpitations which were most noticed at night.  She also noted mild ankle edema.  She called our office on October 31 and was advised to continue Lasix 20 mg daily and she was scheduled for an echocardiogram.  This was performed this afternoon and I was notified by the echo technician that EF again appeared depressed and that she was in rapid atrial fibrillation.  We confirmed this with twelve-lead ECG, showing rapid A. fib with a rate of 126.  Patient was dyspneic with walking only a few feet in the office and after discussion with patient and family, we have opted to send her to the emergency department.  Home Medications    Prior to Admission medications   Medication Sig Start Date End Date Taking? Authorizing Provider  acetaminophen (TYLENOL) 325 MG tablet Take 2 tablets (650 mg total) by mouth every 4 (four) hours as needed for mild pain (or Fever >/= 101). 05/11/15   Barrett, Erin R, PA-C  AMBULATORY NON FORMULARY MEDICATION [facility] at Home Oxygen machine with tubing. DX: COPD DX Code: J44.9 01/15/17   Wilhelmina Mcardle, MD  aspirin EC 81 MG EC tablet Take 1 tablet (81 mg total) by mouth daily. 05/11/15   Barrett, Erin R, PA-C  azelastine (ASTELIN) 0.1 % nasal spray Place 1 spray into both nostrils 2 (two) times daily. Use in each nostril as directed     [provider]  benzonatate (TESSALON) 100 MG capsule Take 1 capsule (100 mg  total) by mouth 3 (three) times daily as needed for cough. 07/01/16   Gladstone Lighter, MD  bisoprolol-hydrochlorothiazide [facility]) 10-6.25 MG tablet Take 1/2 tablet (5/ 3.125 mg) by mouth once daily    [provider]  ferrous sulfate 325 (65 FE) MG tablet Take 325 mg by mouth daily with breakfast.    [provider]  fluticasone (FLONASE) 50 MCG/ACT nasal spray Place 1 spray into both nostrils daily as needed for allergies.     [provider]  furosemide (LASIX) 20 MG tablet Take 20 mg by mouth 2 (two) times daily.     [provider]  gabapentin (NEURONTIN) 100 MG capsule Take 200 mg by mouth at bedtime.  09/29/15   [provider]  guaiFENesin-codeine 100-10 MG/5ML syrup Take 5 mLs by mouth every 4 (four) hours as needed for cough. 10/06/17   Flora Lipps, MD  ipratropium-albuterol (DUONEB) 0.5-2.5 (3) MG/3ML SOLN Take 3 mLs by nebulization every 6 (six) hours as needed. DX:J44.9 10/28/17   Flora Lipps, MD  levothyroxine (SYNTHROID, LEVOTHROID) 88 MCG tablet Take 88 mcg by mouth daily before breakfast.    [provider]  losartan (COZAAR) 100 MG tablet Take 1 tablet (100 mg total) by mouth daily. 08/31/15   Wellington Hampshire, MD  Multiple Vitamins-Minerals (PRESERVISION AREDS 2 PO) Take 2 tablets by mouth daily.    [provider]  pantoprazole (PROTONIX) 40 MG tablet Take 40 mg by mouth 2 (two) times daily.     [provider]  potassium chloride SA (K-DUR,KLOR-CON) 20 MEQ tablet Take 20 mEq by mouth daily. 09/27/15   [provider]  simvastatin (ZOCOR) 20 MG tablet Take 20 mg by mouth at bedtime.     [provider]  warfarin (COUMADIN) 2.5 MG tablet TAKE AS DIRECTED BY COUMADIN CLINIC 07/06/18   Wellington Hampshire, MD    Family History    Family History  Problem Relation Age of Onset  . Heart disease Father   .  Heart disease Brother   . Heart attack Paternal Uncle   . Breast cancer Maternal Aunt   . Breast cancer Paternal Aunt    She indicated that her mother is deceased. She indicated that her father is deceased. She indicated that her brother is deceased. She indicated that her maternal aunt is deceased. She indicated that her paternal aunt is deceased. She indicated that her paternal uncle is deceased.  Social History    Social History   Socioeconomic History  . Marital status: Married    Spouse name: Not on file  . Number of children: Not on file  . Years of education: Not on file  . Highest education level: Not on file  Occupational History  . Not on file  Social Needs  . Financial resource strain: Not on file  . Food insecurity:    Worry: Not on file    Inability: Not on file  . Transportation needs:    Medical: Not on file    Non-medical: Not on file  Tobacco Use  . Smoking status: Former Smoker    Packs/day: 1.50    Years: 40.00    Pack years: 60.00    Types: Cigarettes    Last attempt to quit: 04/20/1998    Years since quitting: 20.2  . Smokeless tobacco: Never Used  Substance and Sexual Activity  . Alcohol use: Yes    Comment: 11/10/2015 "glass of wine a few times/year, if that"  . Drug use: No  .  Sexual activity: Yes  Lifestyle  . Physical activity:    Days per week: Not on file    Minutes per session: Not on file  . Stress: Not on file  Relationships  . Social connections:    Talks on phone: Not on file    Gets together: Not on file    Attends religious service: Not on file    Active member of club or organization: Not on file    Attends meetings of clubs or organizations: Not on file    Relationship status: Not on file  . Intimate partner violence:    Fear of current or ex partner: Not on file    Emotionally abused: Not on file    Physically abused: Not on file    Forced sexual activity: Not on file  Other Topics Concern  . Not on file  Social History  Narrative  . Not on file     Review of Systems    General:  +++ fatigue, no chills, fever, night sweats or weight changes.  Cardiovascular:  No chest pain, +++ dyspnea on exertion, +++ ankle edema, no orthopnea, +++ palpitations, no paroxysmal nocturnal dyspnea. Dermatological: No rash, lesions/masses Respiratory: No cough, +++ dyspnea Urologic: No hematuria, dysuria Abdominal:   No nausea, vomiting, diarrhea, bright red blood per rectum, melena, or hematemesis Neurologic:  No visual changes, +++ wkns, changes in mental status. All other systems reviewed and are otherwise negative except as noted above.  Physical Exam    VS:  BP (!) 146/66   Pulse (!) 126   Resp 16   SpO2 96%  , BMI There is no height or weight on file to calculate BMI. GEN: Well nourished, well developed, in no acute distress. HEENT: normal. Neck: Supple, no JVD, carotid bruits, or masses. Cardiac: IR, IR, tachy, 2/6 syst murmur @ llsb, no rubs, or gallops. No clubbing, cyanosis, trace bilat ankle edema.  Radials/DP/PT 2+ and equal bilaterally.  Respiratory:  Respirations regular and unlabored, clear to auscultation bilaterally. GI: Soft, nontender, nondistended, BS + x 4. MS: no deformity or atrophy. Skin: warm and dry, no rash. Neuro:  Strength and sensation are intact. Psych: Normal affect.  Accessory Clinical Findings    ECG personally reviewed by me today-A. fib with RVR, 126, left axis deviation, incomplete right bundle branch block, septal infarct  Assessment & Plan   1.  A. fib with RVR: Patient with prior history of paroxysmal atrial fibrillation presents with a 3-week history of progressive dyspnea, fatigue, and tachypalpitations.  She presented today for an echocardiogram was found to be tachycardic with LV dysfunction.  ECG shows rapid atrial fibrillation.  She has mild volume overload on exam and has had significant dyspnea on exertion over the past few weeks.  I think she would be difficult to  manage as an outpatient and for that reason, we have wheeled her over to the emergency department for evaluation and admission for rate control and probable initiation of amiodarone therapy.  She has been compliant with Coumadin and most recent INRs have been therapeutic dating back to September.  If she remains in A. fib, Monday, we will need to consider cardioversion.  Continue beta-blocker.  2.  Acute on chronic systolic and diastolic congestive heart failure: Patient with mild volume overload in the setting of above.  Echocardiography performed earlier today primarily shows reduced LV function.  This is likely secondary to atrial fibrillation.  She will require IV diuresis while hospitalized and close monitoring of  renal function.  As above, will need to focus on rhythm management of A. Fib.  3.  Coronary artery disease: Status post CABG x1 at the time of valve surgery.  No chest pain.  Continue beta-blocker and statin.  No aspirin in the setting of warfarin.  4.  Valvular heart disease: Status post mitral valve replacement (bovine) and tricuspid valve repair.  Echo earlier today awaiting formal read.  Preliminary only normal valve functions.  5.  Essential hypertension: Stable.  6.  Hyperlipidemia: Continue statin therapy.  Murray Hodgkins, NP 07/17/2018, 5:56 PM

## 2018-07-17 NOTE — Consult Note (Signed)
[facility] for warfarin dosing Indication: atrial fibrillation  Patient Measurements: Height: 5' (152.4 cm) Weight: 130 lb (59 kg) IBW/kg (Calculated) : 45.5  Vital Signs: BP: 135/81 (11/08 1830) Pulse Rate: 49 (11/08 1830)  Labs: Recent Labs    07/17/18 1610 07/17/18 1844  HGB 10.8*  --   HCT 34.8*  --   PLT 248  --   LABPROT  --  23.2*  INR  --  2.09  CREATININE 1.06*  --   TROPONINI  --  <0.03    Estimated Creatinine Clearance: 33.4 mL/min (A) (by C-G formula based on SCr of 1.06 mg/dL (H)).   Medical History: Past Medical History:  Diagnosis Date  . Anxiety   . Arthritis    "hands" (11/10/2015)  . CAD s/p CABG x 1    a. 04/2015 LIMA to LAD  . Chronic combined systolic (congestive) and diastolic (congestive) heart failure ([place])    a. 01/2015 Echo: EF 50-55%, Gr2 DD (preop valve surgery); b. 08/2015 Echo: EF 30-35%, diff HK. Gr2 DD; c. 09/2016 Echo: EF 50-55%. Nl fxning TV ring. Mildly dil LA.  Marland Kitchen COPD (chronic obstructive pulmonary disease) ([place])   . Coronary artery disease   . GERD (gastroesophageal reflux disease)   . History of colon polyps   . History of mitral valve replacement    a. 04/2015 s/p 27 mm [facility] Mitral bovine bioprosthetic tissue valve  . Hyperlipidemia   . Hypertension   . Hypothyroidism   . 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 04/2015 Complete bilateral atrial lesion set using cryothermy and bipolar radiofrequency ablation with clipping of LA appendage  . Meniere disease   . Meniere's disease   . On home oxygen therapy    "2L at night" (11/10/2015)  . PAF (paroxysmal atrial fibrillation) ([place])   . Pneumonia ~ 2010  . PONV (postoperative nausea and vomiting)    Pt felt like eardrums were gonna pop  . Post-surgical complete heart block, symptomatic    a. 04/2015 s/p MDT [id number] Advisa DR MRI DC PPM.  . Presence of permanent cardiac pacemaker   . Pulmonary hypertension ([place])   . Surgical wound,  non healing - chest wall 11/10/2015  . Tricuspid Regurgitation s/p Repair    a. 04/2015 s/p 26 mm Edwards mc3 ring annuloplasty  . Wound infection 10/13/2015   Superficial sternal wound infection    Assessment: 83 year-old female with a h/o afib on warfarin. She has been started on an amiodarone drip. Her home dose of warfarin is 3.75mg  daily except on Mondays 5mg . Her INR is therapeutic on admission.  Goal of Therapy:  INR 2-3 Monitor platelets by anticoagulation protocol: Yes   Plan:  Due to the new amiodarone DDI, will give 2mg  (approximately 1/2 of home dose) tonight and check an INR in the morning.  Dallie Piles, PharmD 07/17/2018,8:16 PM

## 2018-07-18 DIAGNOSIS — I10 Essential (primary) hypertension: Secondary | ICD-10-CM

## 2018-07-18 DIAGNOSIS — I5043 Acute on chronic combined systolic (congestive) and diastolic (congestive) heart failure: Secondary | ICD-10-CM

## 2018-07-18 DIAGNOSIS — I4891 Unspecified atrial fibrillation: Secondary | ICD-10-CM

## 2018-07-18 LAB — BASIC METABOLIC PANEL
Anion gap: 9 (ref 5–15)
BUN: 20 mg/dL (ref 8–23)
CHLORIDE: 102 mmol/L (ref 98–111)
CO2: 32 mmol/L (ref 22–32)
Calcium: 8.9 mg/dL (ref 8.9–10.3)
Creatinine, Ser: 0.88 mg/dL (ref 0.44–1.00)
GFR calc non Af Amer: 60 mL/min — ABNORMAL LOW (ref >60)
Glucose, Bld: 141 mg/dL — ABNORMAL HIGH (ref 70–99)
POTASSIUM: 3.9 mmol/L (ref 3.5–5.1)
SODIUM: 143 mmol/L (ref 135–145)

## 2018-07-18 LAB — CBC
HEMATOCRIT: 33.5 % — AB (ref 36.0–46.0)
HEMOGLOBIN: 10.1 g/dL — AB (ref 12.0–15.0)
MCH: 29.9 pg (ref 26.0–34.0)
MCHC: 30.1 g/dL (ref 30.0–36.0)
MCV: 99.1 fL (ref 80.0–100.0)
NRBC: 0 % (ref 0.0–0.2)
Platelets: 227 10*3/uL (ref 150–400)
RBC: 3.38 MIL/uL — AB (ref 3.87–5.11)
RDW: 14.5 % (ref 11.5–15.5)
WBC: 9.7 10*3/uL (ref 4.0–10.5)

## 2018-07-18 LAB — PROTIME-INR
INR: 2.21
Prothrombin Time: 24.2 seconds — ABNORMAL HIGH (ref 11.4–15.2)

## 2018-07-18 MED ORDER — SENNOSIDES-DOCUSATE SODIUM 8.6-50 MG PO TABS
1.0000 | ORAL_TABLET | Freq: Every evening | ORAL | Status: DC | PRN
  Administered 2018-07-18: 1 via ORAL
  Filled 2018-07-18: qty 1

## 2018-07-18 MED ORDER — TRAZODONE HCL 50 MG PO TABS
50.0000 mg | ORAL_TABLET | Freq: Once | ORAL | Status: AC
  Administered 2018-07-18: 50 mg via ORAL
  Filled 2018-07-18 (×2): qty 1

## 2018-07-18 MED ORDER — LOSARTAN POTASSIUM 25 MG PO TABS
25.0000 mg | ORAL_TABLET | Freq: Every day | ORAL | Status: DC
  Administered 2018-07-19 – 2018-07-22 (×4): 25 mg via ORAL
  Filled 2018-07-18 (×4): qty 1

## 2018-07-18 MED ORDER — BISACODYL 5 MG PO TBEC: 5.0000 mg | DELAYED_RELEASE_TABLET | Freq: Every day | ORAL | Status: DC | PRN

## 2018-07-18 MED ORDER — DILTIAZEM HCL 25 MG/5ML IV SOLN
10.0000 mg | Freq: Once | INTRAVENOUS | Status: AC
  Administered 2018-07-18: 10 mg via INTRAVENOUS
  Filled 2018-07-18: qty 5

## 2018-07-18 MED ORDER — WARFARIN SODIUM 2 MG PO TABS
2.0000 mg | ORAL_TABLET | Freq: Once | ORAL | Status: AC
  Administered 2018-07-18: 2 mg via ORAL
  Filled 2018-07-18: qty 1

## 2018-07-18 MED ORDER — POLYETHYLENE GLYCOL 3350 17 G PO PACK
17.0000 g | PACK | Freq: Every day | ORAL | Status: DC
  Administered 2018-07-18: 17 g via ORAL
  Filled 2018-07-18 (×4): qty 1

## 2018-07-18 MED ORDER — IPRATROPIUM-ALBUTEROL 0.5-2.5 (3) MG/3ML IN SOLN
3.0000 mL | Freq: Two times a day (BID) | RESPIRATORY_TRACT | Status: DC
  Administered 2018-07-18: 3 mL via RESPIRATORY_TRACT
  Filled 2018-07-18: qty 3

## 2018-07-18 NOTE — Progress Notes (Signed)
Dr. Rayann Heman notified that patient's heart rate has been sustaining in the 130's again, after being controlled overnight. BP is 105/55. Okay to proceed with scheduled dose of bisoprolol this morning and continue to monitor.

## 2018-07-18 NOTE — Progress Notes (Signed)
Patient ID: [name], female   DOB: [birth year], 84 y.o.   MRN: [id number]  [facility] PROGRESS NOTE  [name] OVZ:[id number] DOB: 1934-10-21 DOA: 07/17/2018 PCP: Ezequiel Kayser, MD  HPI/Subjective: Patient feeling better today than yesterday.  Feels like she is able to take a deeper breath.  Heart rate overall little better controlled.  Did have an episode of 140 this morning.  Objective: Vitals:   07/18/18 0951 07/18/18 1150  BP: 102/65 101/70  Pulse: 72 65  Resp:    Temp:    SpO2:      Filed Weights   07/17/18 1607 07/17/18 2105  Weight: 59 kg 59.4 kg    ROS: Review of Systems  Constitutional: Negative for chills and fever.  Eyes: Negative for blurred vision.  Respiratory: Positive for shortness of breath. Negative for cough.   Cardiovascular: Positive for palpitations. Negative for chest pain.  Gastrointestinal: Negative for abdominal pain, constipation, diarrhea, nausea and vomiting.  Genitourinary: Negative for dysuria.  Musculoskeletal: Negative for joint pain.  Neurological: Negative for dizziness and headaches.   Exam: Physical Exam  Constitutional: She is oriented to person, place, and time.  HENT:  Nose: No mucosal edema.  Mouth/Throat: No oropharyngeal exudate or posterior oropharyngeal edema.  Eyes: Pupils are equal, round, and reactive to light. Conjunctivae, EOM and lids are normal.  Neck: No JVD present. Carotid bruit is not present. No edema present. No thyroid mass and no thyromegaly present.  Cardiovascular: S1 normal and S2 normal. Exam reveals no gallop.  No murmur heard. Pulses:      Dorsalis pedis pulses are 2+ on the right side, and 2+ on the left side.  Respiratory: No respiratory distress. She has no wheezes. She has no rhonchi. She has no rales.  GI: Soft. Bowel sounds are normal. There is no tenderness.  Musculoskeletal:       Right ankle: She exhibits no swelling.       Left ankle: She exhibits no swelling.  Lymphadenopathy:   She has no cervical adenopathy.  Neurological: She is alert and oriented to person, place, and time. No cranial nerve deficit.  Skin: Skin is warm. No rash noted. Nails show no clubbing.  Psychiatric: She has a normal mood and affect.      Data Reviewed: Basic Metabolic Panel: Recent Labs  Lab 07/17/18 1610 07/18/18 0510  NA 142 143  K 4.1 3.9  CL 102 102  CO2 32 32  GLUCOSE 179* 141*  BUN 25* 20  CREATININE 1.06* 0.88  CALCIUM 9.2 8.9   CBC: Recent Labs  Lab 07/17/18 1610 07/18/18 0510  WBC 7.5 9.7  HGB 10.8* 10.1*  HCT 34.8* 33.5*  MCV 98.9 99.1  PLT 248 227   Cardiac Enzymes: Recent Labs  Lab 07/17/18 1844  TROPONINI <0.03   BNP (last 3 results) Recent Labs    07/17/18 1838  BNP 477.0*      Studies: Dg Chest 2 View  Result Date: 07/17/2018 CLINICAL DATA:  AFib and difficulty with ambulation. EXAM: CHEST - 2 VIEW COMPARISON:  10/06/2017 FINDINGS: Sternotomy wires, prosthetic heart valve and left pacemaker unchanged. Lungs are adequately inflated without focal lobar consolidation or effusion. Minimal prominence of the perihilar markings which may be due to mild chronic vascular congestion. Stable calcified granuloma over the right midlung. Mild stable cardiomegaly. Remainder the exam is unchanged. IMPRESSION: Mild stable cardiomegaly with suggestion of minimal chronic vascular congestion. Electronically Signed   By: Marin Olp M.D.   On:  07/17/2018 16:46    Scheduled Meds: . amiodarone  150 mg Intravenous Once  . aspirin EC  81 mg Oral Daily  . bisoprolol  10 mg Oral Daily  . ferrous sulfate  325 mg Oral Q breakfast  . furosemide  40 mg Intravenous Daily  . gabapentin  200 mg Oral QHS  . ipratropium-albuterol  3 mL Nebulization BID  . levothyroxine  88 mcg Oral Q0600  . losartan  25 mg Oral Daily  . multivitamin-lutein  2 capsule Oral Daily  . pantoprazole  40 mg Oral BID  . potassium chloride SA  20 mEq Oral Daily  . simvastatin  20 mg Oral QHS   . Warfarin - Pharmacist Dosing Inpatient   Does not apply q1800   Continuous Infusions: . amiodarone 30 mg/hr (07/18/18 1507)    Assessment/Plan:  1. Atrial fibrillation with rapid ventricular response.  Continue amiodarone drip.  Patient therapeutic on Coumadin.  Continue bisoprolol.  Cardiology to consider cardioversion on Monday. 2. Acute on chronic systolic congestive heart failure.  Continue Lasix 40 mg IV daily.  Continue bisoprolol.  Decrease the dose of losartan with relative hypotension. 3. COPD.  Continue DuoNeb nebulizer standing dose.  Oxygen at night for sleep apnea. 4. Sleep apnea.  Oxygen at night. 5. History of coronary artery disease 6. Hyperlipidemia unspecified on Zocor 7. GERD on Protonix 8. Hypothyroidism unspecified on Synthroid  Code Status:     Code Status Orders  (From admission, onward)         Start     Ordered   07/17/18 2000  Do not attempt resuscitation (DNR)  Continuous    Question Answer Comment  In the event of cardiac or respiratory ARREST Do not call a "code blue"   In the event of cardiac or respiratory ARREST Do not perform Intubation, CPR, defibrillation or ACLS   In the event of cardiac or respiratory ARREST Use medication by any route, position, wound care, and other measures to relive pain and suffering. May use oxygen, suction and manual treatment of airway obstruction as needed for comfort.   Comments nurse may pronounce      07/17/18 2000        Code Status History    Date Active Date Inactive Code Status Order ID Comments User Context   06/29/2016 1240 07/01/2016 1707 Full Code [id number]  Henreitta Leber, MD Inpatient   11/10/2015 1242 11/27/2015 1552 Full Code [id number]  John Giovanni, PA-C ED   05/02/2015 2202 05/11/2015 1907 Full Code [id number]  Thompson Grayer, MD Inpatient   04/25/2015 1629 05/02/2015 2202 Full Code [id number]  Rexene Alberts, MD Inpatient    Advance Directive Documentation     Most Recent Value  Type of  Advance Directive  Healthcare Power of Attorney, Living will  Pre-existing out of facility DNR order (yellow form or pink MOST form)  -  "MOST" Form in Place?  -     Family Communication: Husband at the bedside Disposition Plan: To be determined  Consultants:  Cardiology  Time spent: 28 minutes  [facility]

## 2018-07-18 NOTE — Progress Notes (Signed)
Patient has rested quietly today with no complaints. Visitors/family at bedside most of the day. She states she is feeling better overall. She remains on amiodarone gtt with heart rate fluctuating between 90-120's but did not get any higher when walking with PT. Possible cardioversion Monday per cardiologist note. Patient receiving coumadin, pharmacy following.

## 2018-07-18 NOTE — Progress Notes (Signed)
Progress Note   Subjective   Doing well today, the patient denies CP.  SOB primarily occurs with activity currently.  No new concerns  Inpatient Medications    Scheduled Meds: . amiodarone  150 mg Intravenous Once  . aspirin EC  81 mg Oral Daily  . bisoprolol  10 mg Oral Daily  . ferrous sulfate  325 mg Oral Q breakfast  . furosemide  40 mg Intravenous Daily  . gabapentin  200 mg Oral QHS  . ipratropium-albuterol  3 mL Nebulization BID  . levothyroxine  88 mcg Oral Q0600  . losartan  25 mg Oral Daily  . multivitamin-lutein  2 capsule Oral Daily  . pantoprazole  40 mg Oral BID  . potassium chloride SA  20 mEq Oral Daily  . simvastatin  20 mg Oral QHS  . Warfarin - Pharmacist Dosing Inpatient   Does not apply q1800   Continuous Infusions: . amiodarone 30 mg/hr (07/18/18 0400)   PRN Meds: acetaminophen **OR** acetaminophen, azelastine, fluticasone, ondansetron **OR** ondansetron (ZOFRAN) IV   Vital Signs    Vitals:   07/18/18 0600 07/18/18 0822 07/18/18 0951 07/18/18 1150  BP:  (!) 105/55 102/65 101/70  Pulse: 74 (!) 133 72 65  Resp:  19    Temp:  (!) 97.5 F (36.4 C)    TempSrc:  Oral    SpO2:  94%    Weight:      Height:        Intake/Output Summary (Last 24 hours) at 07/18/2018 1227 Last data filed at 07/18/2018 1024 Gross per 24 hour  Intake 453.08 ml  Output 1225 ml  Net -771.92 ml   Filed Weights   07/17/18 1607 07/17/18 2105  Weight: 59 kg 59.4 kg    Telemetry    Afib, V rates elevated at times - Personally Reviewed  Physical Exam   GEN- The patient is elderly appearing, alert and oriented x 3 today.   Head- normocephalic, atraumatic Eyes-  Sclera clear, conjunctiva pink Ears- hearing intact Oropharynx- clear Neck- supple, Lungs- Clear to ausculation bilaterally, normal work of breathing Heart- irregular rate and rhythm  GI- soft, NT, ND, + BS Extremities- no clubbing, cyanosis, or edema  MS- age appropriate atrophy Skin- no rash or  lesion Psych- euthymic mood, full affect Neuro- strength and sensation are intact   Labs    Chemistry Recent Labs  Lab 07/17/18 1610 07/18/18 0510  NA 142 143  K 4.1 3.9  CL 102 102  CO2 32 32  GLUCOSE 179* 141*  BUN 25* 20  CREATININE 1.06* 0.88  CALCIUM 9.2 8.9  GFRNONAA 48* 60*  GFRAA 56* >60  ANIONGAP 8 9     Hematology Recent Labs  Lab 07/17/18 1610 07/18/18 0510  WBC 7.5 9.7  RBC 3.52* 3.38*  HGB 10.8* 10.1*  HCT 34.8* 33.5*  MCV 98.9 99.1  MCH 30.7 29.9  MCHC 31.0 30.1  RDW 14.3 14.5  PLT 248 227    Cardiac Enzymes Recent Labs  Lab 07/17/18 1844  TROPONINI <0.03   No results for input(s): TROPIPOC in the last 168 hours.     Patient Profile    83 year old female with a history of atrial fibrillation, mitral valve stenosis and tricuspid regurgitation status post MV replacement/TVR/maze/left atrial appendage clipping, and CAD status post CABG x1, who presents for follow-up related to a 3-week history of progressive dyspnea and lower extremity swelling.  She appears to have afib with RVR as the cause.   Assessment &  Plan    1.  afib with RVR Her afib has become more persistent.  Dr Aquilla Hacker recent note 9/19 is personally reviewed and suggests that she has been predominantly in sinus.  The patient feels that her last significant AF event was several years ago and the setting of MVR.  She is on coumadin. I agree with Ignacia Bayley that cardioversion on Monday may be her best option.  I would defer AAD therapy for failed cardioversion or recurrent afib.  Her best AAD option may be amiodarone. Continue coumadin and oral rate control for now.  2. Acute on chronic combined systolic and diastolic dysfunction EF is markedly reduced when compared to 09/17/16.  Hopefully this will improve with sinus rhythm Will need medicine optimization and close outpatient follow-up after discharge  3. CAD S/p CABG No ischemic symptoms Continue medical management  4.  HTN Stable No change required today  Anticipate cardioversion on Monday  Thompson Grayer MD, [facility] 07/18/2018 12:27 PM

## 2018-07-18 NOTE — Consult Note (Signed)
ANTICOAGULATION CONSULT NOTE   Pharmacy Consult for warfarin dosing Indication: atrial fibrillation  Patient Measurements: Height: 5\' 4"  (162.6 cm) Weight: 131 lb (59.4 kg) IBW/kg (Calculated) : 54.7  Vital Signs: Temp: 97.5 F (36.4 C) (11/09 0822) Temp Source: Oral (11/09 0822) BP: 102/65 (11/09 0951) Pulse Rate: 72 (11/09 0951)  Labs: Recent Labs    07/17/18 1610 07/17/18 1844 07/18/18 0510  HGB 10.8*  --  10.1*  HCT 34.8*  --  33.5*  PLT 248  --  227  LABPROT  --  23.2* 24.2*  INR  --  2.09 2.21  CREATININE 1.06*  --  0.88  TROPONINI  --  <0.03  --     Estimated Creatinine Clearance: 43.3 mL/min (by C-G formula based on SCr of 0.88 mg/dL).   Medical History: Past Medical History:  Diagnosis Date  . Anxiety   . Arthritis    "hands" (11/10/2015)  . CAD s/p CABG x 1    a. 04/2015 LIMA to LAD  . Chronic combined systolic (congestive) and diastolic (congestive) heart failure ([place])    a. 01/2015 Echo: EF 50-55%, Gr2 DD (preop valve surgery); b. 08/2015 Echo: EF 30-35%, diff HK. Gr2 DD; c. 09/2016 Echo: EF 50-55%. Nl fxning TV ring. Mildly dil LA.  Marland Kitchen COPD (chronic obstructive pulmonary disease) ([place])   . Coronary artery disease   . GERD (gastroesophageal reflux disease)   . History of colon polyps   . History of mitral valve replacement    a. 04/2015 s/p 27 mm [facility] Mitral bovine bioprosthetic tissue valve  . Hyperlipidemia   . Hypertension   . Hypothyroidism   . 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 04/2015 Complete bilateral atrial lesion set using cryothermy and bipolar radiofrequency ablation with clipping of LA appendage  . Meniere disease   . Meniere's disease   . On home oxygen therapy    "2L at night" (11/10/2015)  . PAF (paroxysmal atrial fibrillation) ([place])   . Pneumonia ~ 2010  . PONV (postoperative nausea and vomiting)    Pt felt like eardrums were gonna pop  . Post-surgical complete heart block, symptomatic    a. 04/2015 s/p MDT [id number]  Advisa DR MRI DC PPM.  . Presence of permanent cardiac pacemaker   . Pulmonary hypertension ([place])   . Surgical wound, non healing - chest wall 11/10/2015  . Tricuspid Regurgitation s/p Repair    a. 04/2015 s/p 26 mm Edwards mc3 ring annuloplasty  . Wound infection 10/13/2015   Superficial sternal wound infection    Assessment: 83 year-old female with a h/o afib on warfarin. She has been started on an amiodarone drip. Her home dose of warfarin is 3.75mg  daily except on Mondays 5mg . Her INR is therapeutic on admission.  Goal of Therapy:  INR 2-3 Monitor platelets by anticoagulation protocol: Yes   Plan:  Due to the new amiodarone DDI I will empirically decrease patient dose to by 50% of her original home dose- which results in a dose of 2mg  daily. INR in the AM  [name] D Ndea Kilroy, Pharm.D, BCPS Clinical Pharmacist  07/18/2018,11:49 AM

## 2018-07-18 NOTE — Evaluation (Signed)
Physical Therapy Evaluation Patient Details Name: [name] MRN: [id number] DOB: 1935/08/23 Today's Date: 07/18/2018   History of Present Illness  83 y.o. female with a known history of paroxysmal atrial fibrillation.  For the past 3 weeks has been weak and giving out with walking around.  Admitted with rapid Afib, to have cardioversion 07/20/18.  Clinical Impression  Pt did well regarding all aspects of PT assessment.  She showed good mobility, balance, safety and apart from the elevated HR likely could have walked many 100s of feet w/o issue.  HR did not increase with activity and she did not express excessive fatigue.  Pt likely will not need further PT on d/c, but we will keep PT orders active to insure continued appropriateness concerning cardioversion this Monday.  Did discuss cardiac rehab as a possibility on discharge.    Follow Up Recommendations Other (comment)(cardiac rehab)    Equipment Recommendations  None recommended by PT    Recommendations for Other Services       Precautions / Restrictions Precautions Precautions: Fall Restrictions Weight Bearing Restrictions: No      Mobility  Bed Mobility Overal bed mobility: Independent                Transfers Overall transfer level: Independent Equipment used: None             General transfer comment: able to rise and maintain balance w/o AD  Ambulation/Gait Ambulation/Gait assistance: Supervision Gait Distance (Feet): 75 Feet Assistive device: Rolling walker (2 wheeled)       General Gait Details: Pt with good phyiscal safety/balance/confidence t/o the effort.  She did have elevated HR at rest pre-ambulation (120s) HR actually went down with activity 95-115bpm during ambulation. No c/o fatigue during the effort.   Stairs            Wheelchair Mobility    Modified Rankin (Stroke Patients Only)       Balance Overall balance assessment: Modified Independent                                            Pertinent Vitals/Pain Pain Assessment: No/denies pain    Home Living Family/patient expects to be discharged to:: Private residence Living Arrangements: Spouse/significant other Available Help at Discharge: Family   Home Access: Stairs to enter Entrance Stairs-Rails: Right Entrance Stairs-Number of Steps: [place]: Environmental consultant - 2 wheels;Cane - single point      Prior Function Level of Independence: Independent with assistive device(s)         Comments: Pt out of the house with some regularity, often does not need any AD     Hand Dominance        Extremity/Trunk Assessment   Upper Extremity Assessment Upper Extremity Assessment: Overall WFL for tasks assessed;Generalized weakness    Lower Extremity Assessment Lower Extremity Assessment: Overall WFL for tasks assessed;Generalized weakness       Communication   Communication: No difficulties  Cognition Arousal/Alertness: Awake/alert Behavior During Therapy: WFL for tasks assessed/performed Overall Cognitive Status: Within Functional Limits for tasks assessed                                        General Comments      Exercises  Assessment/Plan    PT Assessment Patient needs continued PT services  PT Problem List Decreased safety awareness;Cardiopulmonary status limiting activity;Decreased activity tolerance       PT Treatment Interventions Functional mobility training;Gait training;Stair training;Therapeutic exercise;Balance training;Therapeutic activities;Patient/family education;Neuromuscular re-education    PT Goals (Current goals can be found in the Care Plan section)  Acute Rehab PT Goals Patient Stated Goal: go home PT Goal Formulation: With patient Time For Goal Achievement: 08/01/18 Potential to Achieve Goals: Good    Frequency Min 2X/week   Barriers to discharge        Co-evaluation               AM-PAC PT "6 Clicks"  Daily Activity  Outcome Measure Difficulty turning over in bed (including adjusting bedclothes, sheets and blankets)?: None Difficulty moving from lying on back to sitting on the side of the bed? : None Difficulty sitting down on and standing up from a chair with arms (e.g., wheelchair, bedside commode, etc,.)?: None Help needed moving to and from a bed to chair (including a wheelchair)?: None Help needed walking in hospital room?: None Help needed climbing 3-5 steps with a railing? : None 6 Click Score: 24    End of Session Equipment Utilized During Treatment: Gait belt Activity Tolerance: Patient tolerated treatment well Patient left: with chair alarm set;with call bell/phone within reach;with family/visitor present Nurse Communication: Mobility status(HR during ambulation) PT Visit Diagnosis: Muscle weakness (generalized) (M62.81);Difficulty in walking, not elsewhere classified (R26.2)    Time: 0300-9233 PT Time Calculation (min) (ACUTE ONLY): 27 min   Charges:   PT Evaluation $PT Eval Low Complexity: 1 Low          Kreg Shropshire, DPT 07/18/2018, 4:40 PM

## 2018-07-19 LAB — HEMOGLOBIN A1C
HEMOGLOBIN A1C: 6 % — AB (ref 4.8–5.6)
Mean Plasma Glucose: 125.5 mg/dL

## 2018-07-19 LAB — PROTIME-INR
INR: 2.33
PROTHROMBIN TIME: 25.2 s — AB (ref 11.4–15.2)

## 2018-07-19 MED ORDER — FUROSEMIDE 40 MG PO TABS: 40.0000 mg | ORAL_TABLET | Freq: Every day | ORAL | Status: DC

## 2018-07-19 MED ORDER — FUROSEMIDE 10 MG/ML IJ SOLN
40.0000 mg | Freq: Two times a day (BID) | INTRAMUSCULAR | Status: DC
  Administered 2018-07-19 – 2018-07-22 (×6): 40 mg via INTRAVENOUS
  Filled 2018-07-19 (×6): qty 4

## 2018-07-19 MED ORDER — LORAZEPAM 1 MG PO TABS
1.0000 mg | ORAL_TABLET | Freq: Once | ORAL | Status: AC
  Administered 2018-07-19: 1 mg via ORAL
  Filled 2018-07-19: qty 1

## 2018-07-19 MED ORDER — WARFARIN SODIUM 2 MG PO TABS
2.0000 mg | ORAL_TABLET | Freq: Every day | ORAL | Status: DC
  Administered 2018-07-19: 2 mg via ORAL
  Filled 2018-07-19 (×2): qty 1

## 2018-07-19 MED ORDER — IPRATROPIUM-ALBUTEROL 0.5-2.5 (3) MG/3ML IN SOLN: 3.0000 mL | Freq: Four times a day (QID) | RESPIRATORY_TRACT | Status: DC | PRN

## 2018-07-19 NOTE — Plan of Care (Signed)
Pt. Heart rate remains a-fib 90's - 110's throughout shift.  Pt able to ambulate to restroom on room air with slight SOB and quick recovery of breathing. Her HR has not exceeded 120 this shift even upon ambulation.  Problem: Clinical Measurements: Goal: Ability to maintain clinical measurements within normal limits will improve Outcome: Progressing Goal: Respiratory complications will improve Outcome: Progressing Goal: Cardiovascular complication will be avoided Outcome: Progressing   Problem: Activity: Goal: Risk for activity intolerance will decrease Outcome: Progressing

## 2018-07-19 NOTE — Consult Note (Signed)
ANTICOAGULATION CONSULT NOTE   Pharmacy Consult for warfarin dosing Indication: atrial fibrillation  Patient Measurements: Height: 5\' 4"  (162.6 cm) Weight: 131 lb (59.4 kg) IBW/kg (Calculated) : 54.7  Vital Signs: Temp: 98.6 F (37 C) (11/10 0427) Temp Source: Oral (11/10 0427) BP: 122/68 (11/10 0427) Pulse Rate: 119 (11/10 0427)  Labs: Recent Labs    07/17/18 1610 07/17/18 1844 07/18/18 0510 07/19/18 0450  HGB 10.8*  --  10.1*  --   HCT 34.8*  --  33.5*  --   PLT 248  --  227  --   LABPROT  --  23.2* 24.2* 25.2*  INR  --  2.09 2.21 2.33  CREATININE 1.06*  --  0.88  --   TROPONINI  --  <0.03  --   --     Estimated Creatinine Clearance: 43.3 mL/min (by C-G formula based on SCr of 0.88 mg/dL).   Medical History: Past Medical History:  Diagnosis Date  . Anxiety   . Arthritis    "hands" (11/10/2015)  . CAD s/p CABG x 1    a. 04/2015 LIMA to LAD  . Chronic combined systolic (congestive) and diastolic (congestive) heart failure ([place])    a. 01/2015 Echo: EF 50-55%, Gr2 DD (preop valve surgery); b. 08/2015 Echo: EF 30-35%, diff HK. Gr2 DD; c. 09/2016 Echo: EF 50-55%. Nl fxning TV ring. Mildly dil LA.  Marland Kitchen COPD (chronic obstructive pulmonary disease) ([place])   . Coronary artery disease   . GERD (gastroesophageal reflux disease)   . History of colon polyps   . History of mitral valve replacement    a. 04/2015 s/p 27 mm [facility] Mitral bovine bioprosthetic tissue valve  . Hyperlipidemia   . Hypertension   . Hypothyroidism   . 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 04/2015 Complete bilateral atrial lesion set using cryothermy and bipolar radiofrequency ablation with clipping of LA appendage  . Meniere disease   . Meniere's disease   . On home oxygen therapy    "2L at night" (11/10/2015)  . PAF (paroxysmal atrial fibrillation) ([place])   . Pneumonia ~ 2010  . PONV (postoperative nausea and vomiting)    Pt felt like eardrums were gonna pop  . Post-surgical complete heart  block, symptomatic    a. 04/2015 s/p MDT [id number] Advisa DR MRI DC PPM.  . Presence of permanent cardiac pacemaker   . Pulmonary hypertension ([place])   . Surgical wound, non healing - chest wall 11/10/2015  . Tricuspid Regurgitation s/p Repair    a. 04/2015 s/p 26 mm Edwards mc3 ring annuloplasty  . Wound infection 10/13/2015   Superficial sternal wound infection    Assessment: 83 year-old female with a h/o afib on warfarin. She has been started on an amiodarone drip. Her home dose of warfarin is 3.75mg  daily except on Mondays 5mg . Her INR is therapeutic on admission.  Goal of Therapy:  INR 2-3 Monitor platelets by anticoagulation protocol: Yes   Plan:  INR remain therapeutic. Continue with reduced dose of 2mg  daily due to the new amiodarone DDI. INR in the AM  [name] D [name], Pharm.D, BCPS Clinical Pharmacist  07/19/2018,8:13 AM

## 2018-07-19 NOTE — Progress Notes (Signed)
Progress Note   Subjective   Doing well today, the patient denies CP.  + SOB with any activity.     Inpatient Medications    Scheduled Meds: . amiodarone  150 mg Intravenous Once  . aspirin EC  81 mg Oral Daily  . bisoprolol  10 mg Oral Daily  . ferrous sulfate  325 mg Oral Q breakfast  . furosemide  40 mg Intravenous BID  . gabapentin  200 mg Oral QHS  . levothyroxine  88 mcg Oral Q0600  . losartan  25 mg Oral Daily  . multivitamin-lutein  2 capsule Oral Daily  . pantoprazole  40 mg Oral BID  . polyethylene glycol  17 g Oral Daily  . potassium chloride SA  20 mEq Oral Daily  . warfarin  2 mg Oral q1800  . Warfarin - Pharmacist Dosing Inpatient   Does not apply q1800   Continuous Infusions: . amiodarone 30 mg/hr (07/19/18 1234)   PRN Meds: acetaminophen **OR** acetaminophen, azelastine, bisacodyl, fluticasone, ipratropium-albuterol, ondansetron **OR** ondansetron (ZOFRAN) IV, senna-docusate   Vital Signs    Vitals:   07/18/18 1941 07/18/18 2010 07/19/18 0427 07/19/18 0838  BP:  106/70 122/68 115/85  Pulse:  (!) 120 (!) 119 (!) 113  Resp:  20 18 18   Temp:  98 F (36.7 C) 98.6 F (37 C) 97.7 F (36.5 C)  TempSrc:  Oral Oral Oral  SpO2: 90% 92% 95% 93%  Weight:      Height:        Intake/Output Summary (Last 24 hours) at 07/19/2018 1423 Last data filed at 07/19/2018 1403 Gross per 24 hour  Intake 1154.39 ml  Output 500 ml  Net 654.39 ml   Filed Weights   07/17/18 1607 07/17/18 2105  Weight: 59 kg 59.4 kg    Telemetry    Afib/ atypical atrial flutter with RVR - Personally Reviewed  Physical Exam   GEN- The patient is elderly appearing, alert and oriented x 3 today.   Head- normocephalic, atraumatic Eyes-  Sclera clear, conjunctiva pink Ears- hearing intact Oropharynx- clear Neck- supple,  Marked JVP elevation Lungs- Clear to ausculation bilaterally, normal work of breathing Heart- irregular rate and rhythm  GI- soft, NT, ND, + BS Extremities-  no clubbing, cyanosis, +edema  MS- + age appropriate atrophy Skin- no rash or lesion Psych- euthymic mood, full affect Neuro- strength and sensation are intact   Labs    Chemistry Recent Labs  Lab 07/17/18 1610 07/18/18 0510  NA 142 143  K 4.1 3.9  CL 102 102  CO2 32 32  GLUCOSE 179* 141*  BUN 25* 20  CREATININE 1.06* 0.88  CALCIUM 9.2 8.9  GFRNONAA 48* 60*  GFRAA 56* >60  ANIONGAP 8 9     Hematology Recent Labs  Lab 07/17/18 1610 07/18/18 0510  WBC 7.5 9.7  RBC 3.52* 3.38*  HGB 10.8* 10.1*  HCT 34.8* 33.5*  MCV 98.9 99.1  MCH 30.7 29.9  MCHC 31.0 30.1  RDW 14.3 14.5  PLT 248 227    Cardiac Enzymes Recent Labs  Lab 07/17/18 1844  TROPONINI <0.03   No results for input(s): TROPIPOC in the last 168 hours.       Patient Profile   83 year old female with a history of atrial fibrillation, mitralvalve stenosisand tricuspid regurgitation status post MV replacement/TVR/maze/left atrial appendage clipping, and CAD status post CABG x1, who presents for follow-up related to a 3-week history of progressive dyspnea and lower extremity swelling.  She appears  to have afib with RVR as the cause.  Assessment & Plan    1.  afib and atypical atrial flutter with RVR Remains in afib despite amiodarone over the weekend. Will make NPO for cardioversion in AM.  Risks of cardioversion discussed at length with patient, her bother, and sister-in-law Orders for the procedure placed Continue IV amiodarone until tomorrow am Would convert to oral amiodarone and continue for a couple months after discharge.  She can follow-up with Dr Caryl Comes for further management She is on coumadin.  Will need close INR follow-ups with addition of amiodarone.  2. Acute on chronic combined systolic and diastolic dysfnction EF has markedly reduced.  This appears relatively new when compared to 09/17/16. Hopefully this will improve with treatment of afib as  Above She appears more volume overloaded  today. Switch oral lasix to 40-mg IV BID  3. CAD S/p CABG Stable No change required today  4. HTN Stable No change required today bmet in am  Anticipate cardioversion in am Hopefully home in 1-2 days  Thompson Grayer MD, [facility] 07/19/2018 2:23 PM

## 2018-07-19 NOTE — Progress Notes (Signed)
Patient ID: [name], female   DOB: Sep 19, 1934, 83 y.o.   MRN: [id number]   [employer] PROGRESS NOTE  SIDNEY SILBERMAN NTZ:[id number] DOB: 1935-01-09 DOA: 07/17/2018 PCP: Ezequiel Kayser, MD  HPI/Subjective: Patient feels better and offers no complaints.  States her breathing is better.  States when she walks around she does not feel the palpitations as much.  She is not short of breath.  She still does feel some weakness in her legs when she walks around and they feel like they give out.  Also having some burning in her legs.  Objective: Vitals:   07/19/18 0838 07/19/18 1544  BP: 115/85 119/62  Pulse: (!) 113 100  Resp: 18 (!) 21  Temp: 97.7 F (36.5 C) (!) 97.5 F (36.4 C)  SpO2: 93% 91%    Filed Weights   07/17/18 1607 07/17/18 2105  Weight: 59 kg 59.4 kg    ROS: Review of Systems  Constitutional: Positive for malaise/fatigue. Negative for chills and fever.  Eyes: Negative for blurred vision.  Respiratory: Negative for cough and shortness of breath.   Cardiovascular: Negative for chest pain and palpitations.  Gastrointestinal: Negative for abdominal pain, constipation, diarrhea, nausea and vomiting.  Genitourinary: Negative for dysuria.  Musculoskeletal: Negative for joint pain.  Neurological: Negative for dizziness and headaches.   Exam: Physical Exam  Constitutional: She is oriented to person, place, and time.  HENT:  Nose: No mucosal edema.  Mouth/Throat: No oropharyngeal exudate or posterior oropharyngeal edema.  Eyes: Pupils are equal, round, and reactive to light. Conjunctivae, EOM and lids are normal.  Neck: No JVD present. Carotid bruit is not present. No edema present. No thyroid mass and no thyromegaly present.  Cardiovascular: S1 normal and S2 normal. Exam reveals no gallop.  No murmur heard. Pulses:      Dorsalis pedis pulses are 2+ on the right side, and 2+ on the left side.  Respiratory: No respiratory distress. She has no wheezes. She has no  rhonchi. She has no rales.  GI: Soft. Bowel sounds are normal. There is no tenderness.  Musculoskeletal:       Right ankle: She exhibits no swelling.       Left ankle: She exhibits no swelling.  Lymphadenopathy:    She has no cervical adenopathy.  Neurological: She is alert and oriented to person, place, and time. No cranial nerve deficit.  Skin: Skin is warm. No rash noted. Nails show no clubbing.  Psychiatric: She has a normal mood and affect.      Data Reviewed: Basic Metabolic Panel: Recent Labs  Lab 07/17/18 1610 07/18/18 0510  NA 142 143  K 4.1 3.9  CL 102 102  CO2 32 32  GLUCOSE 179* 141*  BUN 25* 20  CREATININE 1.06* 0.88  CALCIUM 9.2 8.9   CBC: Recent Labs  Lab 07/17/18 1610 07/18/18 0510  WBC 7.5 9.7  HGB 10.8* 10.1*  HCT 34.8* 33.5*  MCV 98.9 99.1  PLT 248 227   Cardiac Enzymes: Recent Labs  Lab 07/17/18 1844  TROPONINI <0.03   BNP (last 3 results) Recent Labs    07/17/18 1838  BNP 477.0*      Studies: Dg Chest 2 View  Result Date: 07/17/2018 CLINICAL DATA:  AFib and difficulty with ambulation. EXAM: CHEST - 2 VIEW COMPARISON:  10/06/2017 FINDINGS: Sternotomy wires, prosthetic heart valve and left pacemaker unchanged. Lungs are adequately inflated without focal lobar consolidation or effusion. Minimal prominence of the perihilar markings which may be due  to mild chronic vascular congestion. Stable calcified granuloma over the right midlung. Mild stable cardiomegaly. Remainder the exam is unchanged. IMPRESSION: Mild stable cardiomegaly with suggestion of minimal chronic vascular congestion. Electronically Signed   By: Marin Olp M.D.   On: 07/17/2018 16:46    Scheduled Meds: . amiodarone  150 mg Intravenous Once  . aspirin EC  81 mg Oral Daily  . bisoprolol  10 mg Oral Daily  . ferrous sulfate  325 mg Oral Q breakfast  . furosemide  40 mg Intravenous BID  . gabapentin  200 mg Oral QHS  . levothyroxine  88 mcg Oral Q0600  . losartan  25  mg Oral Daily  . multivitamin-lutein  2 capsule Oral Daily  . pantoprazole  40 mg Oral BID  . polyethylene glycol  17 g Oral Daily  . potassium chloride SA  20 mEq Oral Daily  . warfarin  2 mg Oral q1800  . Warfarin - Pharmacist Dosing Inpatient   Does not apply q1800   Continuous Infusions: . amiodarone 30 mg/hr (07/19/18 1234)    Assessment/Plan:  1. Atrial fibrillation with rapid ventricular response.  Continue amiodarone drip.  Patient therapeutic on Coumadin.  Continue bisoprolol.  Cardiology to set up for cardioversion tomorrow.  Check a TSH tomorrow morning. 2. Acute on chronic systolic congestive heart failure.  Cardiology increase Lasix to 40 mg IV twice daily.  Continue bisoprolol.  Decreased the dose of losartan with relative hypotension. 3. COPD.  Continue DuoNeb nebulizer standing dose.  Oxygen at night for sleep apnea. 4. Sleep apnea.  Oxygen at night. 5. History of coronary artery disease 6. Hyperlipidemia unspecified.  I will hold Zocor at this time with the patient's weakness.  Could be secondary to statin.  Check a CPK tomorrow morning. 7. GERD on Protonix 8. Hypothyroidism unspecified on Synthroid 9. Impaired fasting glucose add on a hemoglobin A1c  Code Status:     Code Status Orders  (From admission, onward)         Start     Ordered   07/17/18 2000  Do not attempt resuscitation (DNR)  Continuous    Question Answer Comment  In the event of cardiac or respiratory ARREST Do not call a "code blue"   In the event of cardiac or respiratory ARREST Do not perform Intubation, CPR, defibrillation or ACLS   In the event of cardiac or respiratory ARREST Use medication by any route, position, wound care, and other measures to relive pain and suffering. May use oxygen, suction and manual treatment of airway obstruction as needed for comfort.   Comments nurse may pronounce      07/17/18 2000        Code Status History    Date Active Date Inactive Code Status Order  ID Comments User Context   06/29/2016 1240 07/01/2016 1707 Full Code [id number]  Henreitta Leber, MD Inpatient   11/10/2015 1242 11/27/2015 1552 Full Code [id number]  John Giovanni, PA-C ED   05/02/2015 2202 05/11/2015 1907 Full Code [id number]  Thompson Grayer, MD Inpatient   04/25/2015 1629 05/02/2015 2202 Full Code [id number]  Rexene Alberts, MD Inpatient    Advance Directive Documentation     Most Recent Value  Type of Advance Directive  Healthcare Power of Attorney, Living will  Pre-existing out of facility DNR order (yellow form or pink MOST form)  -  "MOST" Form in Place?  -     Family Communication: Family at the bedside Disposition Plan: To  be determined  Consultants:  Cardiology  Time spent: 27 minutes  [place]

## 2018-07-20 ENCOUNTER — Encounter
Admission: EM | Disposition: A | Discharge status: Home / Self Care | Payer: Self-pay | Source: Home / Self Care | Attending: Internal Medicine

## 2018-07-20 ENCOUNTER — Inpatient Hospital Stay: Payer: Medicare Other | Admitting: Certified Registered Nurse Anesthetist

## 2018-07-20 DIAGNOSIS — I5023 Acute on chronic systolic (congestive) heart failure: Secondary | ICD-10-CM

## 2018-07-20 HISTORY — PX: CARDIOVERSION: EP1203

## 2018-07-20 LAB — BASIC METABOLIC PANEL
Anion gap: 8 (ref 5–15)
BUN: 21 mg/dL (ref 8–23)
CHLORIDE: 99 mmol/L (ref 98–111)
CO2: 35 mmol/L — ABNORMAL HIGH (ref 22–32)
CREATININE: 0.82 mg/dL (ref 0.44–1.00)
Calcium: 8.9 mg/dL (ref 8.9–10.3)
GFR calc non Af Amer: >60 mL/min (ref >60)
Glucose, Bld: 114 mg/dL — ABNORMAL HIGH (ref 70–99)
POTASSIUM: 3.2 mmol/L — AB (ref 3.5–5.1)
SODIUM: 142 mmol/L (ref 135–145)

## 2018-07-20 LAB — PROTIME-INR
INR: 1.97
PROTHROMBIN TIME: 22.2 s — AB (ref 11.4–15.2)

## 2018-07-20 LAB — HEPARIN LEVEL (UNFRACTIONATED): Heparin Unfractionated: 0.45 IU/mL (ref 0.30–0.70)

## 2018-07-20 LAB — CK: Total CK: 51 U/L (ref 38–234)

## 2018-07-20 LAB — TSH: TSH: 2.168 u[IU]/mL (ref 0.350–4.500)

## 2018-07-20 LAB — APTT: APTT: 48 s — AB (ref 24–36)

## 2018-07-20 SURGERY — CARDIOVERSION (CATH LAB)
Anesthesia: General

## 2018-07-20 MED ORDER — WARFARIN SODIUM 3 MG PO TABS
3.0000 mg | ORAL_TABLET | ORAL | Status: AC
  Administered 2018-07-20: 3 mg via ORAL
  Filled 2018-07-20 (×2): qty 1

## 2018-07-20 MED ORDER — PROPOFOL 10 MG/ML IV BOLUS
INTRAVENOUS | Status: DC | PRN
  Administered 2018-07-20: 40 mg via INTRAVENOUS

## 2018-07-20 MED ORDER — HEPARIN BOLUS VIA INFUSION
2900.0000 [IU] | Freq: Once | INTRAVENOUS | Status: AC
  Administered 2018-07-20: 2900 [IU] via INTRAVENOUS
  Filled 2018-07-20: qty 2900

## 2018-07-20 MED ORDER — SODIUM CHLORIDE 0.9 % IV SOLN
INTRAVENOUS | Status: DC | PRN
  Administered 2018-07-20: 13:00:00 via INTRAVENOUS

## 2018-07-20 MED ORDER — HEPARIN (PORCINE) 25000 UT/250ML-% IV SOLN
800.0000 [IU]/h | INTRAVENOUS | Status: DC
  Administered 2018-07-20 – 2018-07-21 (×2): 800 [IU]/h via INTRAVENOUS
  Filled 2018-07-20 (×2): qty 250

## 2018-07-20 MED ORDER — POTASSIUM CHLORIDE CRYS ER 20 MEQ PO TBCR
40.0000 meq | EXTENDED_RELEASE_TABLET | Freq: Once | ORAL | Status: AC
  Administered 2018-07-20: 40 meq via ORAL
  Filled 2018-07-20: qty 2

## 2018-07-20 MED ORDER — POTASSIUM CHLORIDE CRYS ER 20 MEQ PO TBCR
40.0000 meq | EXTENDED_RELEASE_TABLET | Freq: Every day | ORAL | Status: DC
  Administered 2018-07-20 – 2018-07-22 (×3): 40 meq via ORAL
  Filled 2018-07-20 (×3): qty 2

## 2018-07-20 NOTE — Addendum Note (Signed)
Addended by: Anselm Pancoast on: 07/20/2018 11:36 AM   Modules accepted: Orders

## 2018-07-20 NOTE — Progress Notes (Addendum)
[place] for heparin bridge w/ warfarin dosing Indication: atrial fibrillation  Allergies  Allergen Reactions  . Ace Inhibitors Cough and Other (See Comments)  . Amoxicillin-Pot Clavulanate Swelling and Other (See Comments)    JOINTS, PAIN Joint pain   . Penicillins Swelling and Other (See Comments)    SWOLLEN JOINTS  Has patient had a PCN reaction causing immediate rash, facial/tongue/throat swelling, SOB or lightheadedness with hypotension: Yes Has patient had a PCN reaction causing severe rash involving mucus membranes or skin necrosis: No Has patient had a PCN reaction that required hospitalization No Has patient had a PCN reaction occurring within the last 10 years: No If all of the above answers are "NO", then may proceed with Cephalosporin use.  Swollen joints  . Nifedipine Other (See Comments)    UNSPECIFIED UNSPECIFIED   . Propranolol Other (See Comments)    UNSPECIFIED UNSPECIFIED   . Diazepam Other (See Comments)    Made her "hyper" Made her "hyper" Made her "hyper"   . Meperidine Other (See Comments)    Made her "hyper" Made her "hyper" Made her "hyper"   . Propoxyphene Other (See Comments)    Made her "hyper" Made her "hyper" Made her "hyper"     Patient Measurements: Height: 5\' 4"  (162.6 cm) Weight: 131 lb (59.4 kg) IBW/kg (Calculated) : 54.7 Heparin Dosing Weight: 59.4 kg  Vital Signs: Temp: 97.6 F (36.4 C) (11/11 0739) Temp Source: Oral (11/11 0739) BP: 110/67 (11/11 0739) Pulse Rate: 117 (11/11 0739)  Labs: Recent Labs    07/17/18 1610  07/17/18 1844 07/18/18 0510 07/19/18 0450 07/20/18 0440 07/20/18 0929  HGB 10.8*  --   --  10.1*  --   --   --   HCT 34.8*  --   --  33.5*  --   --   --   PLT 248  --   --  227  --   --   --   APTT  --   --   --   --   --   --  48*  LABPROT  --    < > 23.2* 24.2* 25.2* 22.2*  --   INR  --    < > 2.09 2.21 2.33 1.97  --   CREATININE 1.06*  --   --  0.88  --   0.82  --   CKTOTAL  --   --   --   --   --  51  --   TROPONINI  --   --  <0.03  --   --   --   --    < > = values in this interval not displayed.    Estimated Creatinine Clearance: 46.5 mL/min (by C-G formula based on SCr of 0.82 mg/dL).   Medical History: Past Medical History:  Diagnosis Date  . Anxiety   . Arthritis    "hands" (11/10/2015)  . CAD s/p CABG x 1    a. 04/2015 LIM[place] to LAD  . Chronic combined systolic (congestive) and diastolic (congestive) heart failure ([place])    a. 01/2015 Echo: EF 50-55%, Gr2 DD (preop valve surgery); b. 08/2015 Echo: EF 30-35%, diff HK. Gr2 DD; c. 09/2016 Echo: EF 50-55%. Nl fxning TV ring. Mildly dil LA.  Marland Kitchen COPD (chronic obstructive pulmonary disease) ([place])   . Coronary artery disease   . GERD (gastroesophageal reflux disease)   . History of colon polyps   . History of mitral valve replacement  a. 04/2015 s/p 27 mm [facility] Mitral bovine bioprosthetic tissue valve  . Hyperlipidemia   . Hypertension   . Hypothyroidism   . 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 04/2015 Complete bilateral atrial lesion set using cryothermy and bipolar radiofrequency ablation with clipping of LA appendage  . Meniere disease   . Meniere's disease   . On home oxygen therapy    "2L at night" (11/10/2015)  . PAF (paroxysmal atrial fibrillation) ([place])   . Pneumonia ~ 2010  . PONV (postoperative nausea and vomiting)    Pt felt like eardrums were gonna pop  . Post-surgical complete heart block, symptomatic    a. 04/2015 s/p MDT [id number] Advisa DR MRI DC PPM.  . Presence of permanent cardiac pacemaker   . Pulmonary hypertension ([place])   . Surgical wound, non healing - chest wall 11/10/2015  . Tricuspid Regurgitation s/p Repair    a. 04/2015 s/p 26 mm Edwards mc3 ring annuloplasty  . Wound infection 10/13/2015   Superficial sternal wound infection    Assessment: 83 year old female with h/o afib on warfarin PTA. Patient's home dose of warfarin is 3.75 mg daily except 5  mg on Mondays. Patient started on amiodarone drip and plan for cardioversion today with continuation of PO amiodarone after cardioversion. The patient's warfarin was reduced to 2 mg daily for drug interaction with amiodarone and the INR remained therapeutic. Today's INR subtherapeutic at 1.97. Pharmacy consulted for heparin drip prior to cardioversion.  Anticipate home dose of warfarin 3 mg daily except 2 mg on Tuesday and Thursday. This represents an approximate 30% decrease from the patient's PTA regimen to account for the new drug interaction with amiodarone.  Goal of Therapy:  Heparin level 0.3-0.7 units/ml  INR 2-3 Monitor platelets by anticoagulation protocol: Yes   Plan:  Will give heparin 2900 unit bolus and start heparin drip at 800 units/hr. Heparin level ordered for 1800. Plan to continue heparin drip post-cardioversion until INR therapeutic.  INR 1.97 subtherapeutic. Will give warfarin 3 mg now per Dr. Fletcher Anon. Will plan to transition patient to the above warfarin regimen starting tomorrow.  INR and CBC ordered with morning labs.  Tawnya Crook, PharmD Pharmacy Resident  07/20/2018 10:19 AM

## 2018-07-20 NOTE — Care Management Important Message (Signed)
Copy of signed IM left with patient in room.  

## 2018-07-20 NOTE — CV Procedure (Signed)
Cardioversion note: A standard informed consent was obtained. Timeout was performed. The pads were placed in the anterior posterior fashion. The patient was given propofol by the anesthesia team.  Successful cardioversion was performed with a 200 J. The patient converted to AV paced rhythm Pre-and post EKGs were reviewed. The patient tolerated the procedure with no immediate complications.  Recommendations: Continue IV amiodarone today and transition to amiodarone 200 mg by mouth twice daily for 2 weeks followed by 200 mg once daily. Continue unfractionated heparin until we make sure that INR is above 2 tomorrow.

## 2018-07-20 NOTE — Anesthesia Procedure Notes (Signed)
Performed by: Stirling Orton, CRNA Pre-anesthesia Checklist: Patient identified, Emergency Drugs available, Patient being monitored, Suction available and Timeout performed Patient Re-evaluated:Patient Re-evaluated prior to induction Oxygen Delivery Method: Nasal cannula Induction Type: IV induction       

## 2018-07-20 NOTE — Anesthesia Post-op Follow-up Note (Signed)
Anesthesia QCDR form completed.        

## 2018-07-20 NOTE — Anesthesia Preprocedure Evaluation (Signed)
Anesthesia Evaluation  Patient identified by MRN, date of birth, ID band Patient awake    Reviewed: Allergy & Precautions, H&P , NPO status , Patient's Chart, lab work & pertinent test results, reviewed documented beta blocker date and time   History of Anesthesia Complications (+) PONV and history of anesthetic complications  Airway Mallampati: II   Neck ROM: full    Dental  (+) Poor Dentition, Teeth Intact   Pulmonary neg pulmonary ROS, pneumonia, resolved, COPD, former smoker,    Pulmonary exam normal        Cardiovascular Exercise Tolerance: Poor hypertension, On Medications + CAD and +CHF  negative cardio ROS Normal cardiovascular exam+ dysrhythmias Atrial Fibrillation + pacemaker  Rhythm:regular Rate:Normal     Neuro/Psych Anxiety negative neurological ROS  negative psych ROS   GI/Hepatic negative GI ROS, Neg liver ROS, GERD  ,  Endo/Other  negative endocrine ROSdiabetes, Well Controlled, Type 2Hypothyroidism   Renal/GU negative Renal ROS  negative genitourinary   Musculoskeletal   Abdominal   Peds  Hematology negative hematology ROS (+)   Anesthesia Other Findings Past Medical History: No date: Anxiety No date: Arthritis     Comment:  "hands" (11/10/2015) No date: CAD s/p CABG x 1     Comment:  a. 04/2015 LIMA to LAD No date: Chronic combined systolic (congestive) and diastolic  (congestive) heart failure ([place])     Comment:  a. 01/2015 Echo: EF 50-55%, Gr2 DD (preop valve surgery);              b. 08/2015 Echo: EF 30-35%, diff HK. Gr2 DD; c. 09/2016               Echo: EF 50-55%. Nl fxning TV ring. Mildly dil LA. No date: COPD (chronic obstructive pulmonary disease) (HCC) No date: Coronary artery disease No date: GERD (gastroesophageal reflux disease) No date: History of colon polyps No date: History of mitral valve replacement     Comment:  a. 04/2015 s/p 27 mm Edwards Magna Mitral bovine                bioprosthetic tissue valve No date: Hyperlipidemia No date: Hypertension No date: Hypothyroidism No date: Maze operation for AF w/ LAA clipping     Comment:  a. 04/2015 Complete bilateral atrial lesion set using               cryothermy and bipolar radiofrequency ablation with               clipping of LA appendage No date: Meniere disease No date: Meniere's disease No date: On home oxygen therapy     Comment:  "2L at night" (11/10/2015) No date: PAF (paroxysmal atrial fibrillation) ([place]) ~ 2010: Pneumonia No date: PONV (postoperative nausea and vomiting)     Comment:  Pt felt like eardrums were gonna pop No date: Post-surgical complete heart block, symptomatic     Comment:  a. 04/2015 s/p MDT [id number] Advisa DR MRI DC PPM. No date: Presence of permanent cardiac pacemaker No date: Pulmonary hypertension ([place]) 11/10/2015: Surgical wound, non healing - chest wall No date: Tricuspid Regurgitation s/p Repair     Comment:  a. 04/2015 s/p 26 mm Edwards mc3 ring annuloplasty 10/13/2015: Wound infection     Comment:  Superficial sternal wound infection Past Surgical History: No date: APPENDECTOMY 7/34/1937: APPLICATION OF A-CELL OF CHEST/ABDOMEN; N/A     Comment:  Procedure: APPLICATION OF A-CELL OF CHEST/ABDOMEN;  Surgeon: Loel Lofty Dillingham, DO;  Location: [place];                Service: Plastics;  Laterality: N/A; 9/37/1696: APPLICATION OF A-CELL OF CHEST/ABDOMEN; Right     Comment:  Procedure: APPLICATION OF A-CELL OF RIGHT CHEST;                Surgeon: Loel Lofty Dillingham, DO;  Location: [place];                Service: Plastics;  Laterality: Right; 03/16/9380: APPLICATION OF A-CELL OF CHEST/ABDOMEN; Right     Comment:  Procedure: APPLICATION OF A-CELL OF RIGHT CHEST;                Surgeon: Loel Lofty Dillingham, DO;  Location: [place];                Service: Plastics;  Laterality: Right; 0/09/7508: APPLICATION OF A-CELL OF CHEST/ABDOMEN; Right     Comment:  Procedure: APPLICATION  OF A-CELL TO RIGHT CHEST WOUND;                Surgeon: Loel Lofty Dillingham, DO;  Location: [place];                Service: Plastics;  Laterality: Right; 2/58/5277: APPLICATION OF WOUND VAC; N/A     Comment:  Procedure: APPLICATION OF WOUND VAC;  Surgeon: Rexene Alberts, MD;  Location: [place];  Service: Thoracic;                Laterality: N/A; 04/10/4234: APPLICATION OF WOUND VAC; N/A     Comment:  Procedure: APPLICATION OF WOUND VAC;  Surgeon: Rexene Alberts, MD;  Location: [place];  Service: Thoracic;                Laterality: N/A; 3/61/4431: APPLICATION OF WOUND VAC; N/A     Comment:  Procedure: APPLICATION OF WOUND VAC;  Surgeon: Loel Lofty               Dillingham, DO;  Location: [place];  Service: Plastics;                Laterality: N/A; 5/40/0867: APPLICATION OF WOUND VAC; Right     Comment:  Procedure: APPLICATION OF WOUND VAC to right chest wall               ;  Surgeon: Loel Lofty Dillingham, DO;  Location: [place];                Service: Plastics;  Laterality: Right; 02/08/9508: APPLICATION OF WOUND VAC; Right     Comment:  Procedure: APPLICATION OF WOUND VAC RIGHT CHEST ;                Surgeon: Loel Lofty Dillingham, DO;  Location: [place];                Service: Plastics;  Laterality: Right; 12/03/7122: APPLICATION OF WOUND VAC; Right     Comment:  Procedure: APPLICATION OF WOUND VAC RIGHT CHEST WALL;                Surgeon: Loel Lofty Dillingham, DO;  Location: [place];                Service: Plastics;  Laterality: Right;  09/17/3788: APPLICATION OF WOUND VAC; Right     Comment:  Procedure: RE-APPLICATION OF WOUND VAC TO RIGHT CHEST               WOUND;  Surgeon: Loel Lofty Dillingham, DO;  Location: [place];  Service: Plastics;  Laterality: Right; 11/25/06: BREAST BIOPSY; Left     Comment:  neg 01/20/12: BREAST BIOPSY; Left     Comment:  /clip-neg 11/2013: CARDIAC CATHETERIZATION     Comment:  [facility] 10/2014: CARDIAC CATHETERIZATION     Comment:   [place] No date: CARDIAC VALVE REPLACEMENT 2014: CATARACT EXTRACTION W/ INTRAOCULAR LENS  IMPLANT, BILATERAL;  Bilateral 04/25/2015: CLIPPING OF ATRIAL APPENDAGE; N/A     Comment:  Procedure: CLIPPING OF ATRIAL APPENDAGE;  Surgeon:               Rexene Alberts, MD;  Location: [place];  Service: Open               Heart Surgery;  Laterality: N/A; 2005?: COCHLEAR IMPLANT; Left 01/20/2018: COLONOSCOPY WITH PROPOFOL; N/A     Comment:  Procedure: COLONOSCOPY WITH PROPOFOL;  Surgeon: [place],               Benay Pike, MD;  Location: [facility];  Service:               Gastroenterology;  Laterality: N/A; No date: CORONARY ANGIOPLASTY 04/25/2015: CORONARY ARTERY BYPASS GRAFT; N/A     Comment:  Procedure: CORONARY ARTERY BYPASS GRAFTING (CABG) x ONE,              using left internal mammary artery;  Surgeon: Rexene Alberts, MD;  Location: [place];  Service: Open Heart Surgery;              Laterality: N/A; "several before hysterectomy": DILATION AND CURETTAGE OF UTERUS 05/02/2015: EP IMPLANTABLE DEVICE; N/A     Comment:  Procedure: Pacemaker Implant;  Surgeon: Thompson Grayer,               MD;  Location: [place] [facility];  Service:               Cardiovascular;  Laterality: N/A; 01/20/2018: ESOPHAGOGASTRODUODENOSCOPY (EGD) WITH PROPOFOL; N/A     Comment:  Procedure: ESOPHAGOGASTRODUODENOSCOPY (EGD) WITH               PROPOFOL;  Surgeon: [place], Benay Pike, MD;  Location:               [facility];  Service: Gastroenterology;  Laterality:               N/A; No date: EYE SURGERY 12/06/2015: I&D EXTREMITY; Right     Comment:  Procedure: IRRIGATION AND DEBRIDEMENT RIGHT CHEST WALL               WITH ACELL PLACEMENT AND VAC;  Surgeon: Loel Lofty               Dillingham, DO;  Location: [place];  Service: Plastics;                Laterality: Right; 11/21/2015: INCISION AND DRAINAGE OF WOUND; N/A     Comment:  Procedure: IRRIGATION AND DEBRIDEMENT WOUND;  Surgeon:               Loel Lofty  Dillingham,  DO;  Location: [place];  Service:               Plastics;  Laterality: N/A; 12/21/2015: INCISION AND DRAINAGE OF WOUND; Right     Comment:  Procedure: IRRIGATION AND DEBRIDEMENT right chest wall               WOUND;  Surgeon: Loel Lofty Dillingham, DO;  Location: [place];  Service: Plastics;  Laterality: Right;  right chest               wall  01/24/2016: INCISION AND DRAINAGE OF WOUND; Right     Comment:  Procedure: IRRIGATION AND DEBRIDEMENT RIGHT CHEST WALL               WOUND;  Surgeon: Loel Lofty Dillingham, DO;  Location: [place];  Service: Plastics;  Laterality: Right; 03/28/2016: INCISION AND DRAINAGE OF WOUND; Right     Comment:  Procedure: IRRIGATION AND DEBRIDEMENT RIGHT CHEST WALL               WOUND WITH A Cell Placement;  Surgeon: Loel Lofty               Dillingham, DO;  Location: [place];  Service: Plastics;                Laterality: Right; 05/17/2016: INCISION AND DRAINAGE OF WOUND; Right     Comment:  Procedure: IRRIGATION AND DEBRIDEMENT OF RIGHT CHEST               WOUND WITH A CELL PLACEMENT;  Surgeon: Loel Lofty               Dillingham, DO;  Location: [place];  Service: Plastics;                Laterality: Right; No date: INSERT / REPLACE / REMOVE PACEMAKER 01/11/2016: IRRIGATION AND DEBRIDEMENT OF WOUND WITH SPLIT THICKNESS  SKIN GRAFT; Right     Comment:  Procedure: IRRIGATION AND DEBRIDEMENT OF RIGHT CHEST               WOUND ;  Surgeon: Loel Lofty Dillingham, DO;  Location: [place];  Service: Plastics;  Laterality: Right; 2002: MASTECTOMY, PARTIAL; Right     Comment:  positive 04/25/2015: MAZE; N/A     Comment:  Procedure: MAZE;  Surgeon: Rexene Alberts, MD;                Location: [place];  Service: Open Heart Surgery;                Laterality: N/A; 04/25/2015: MITRAL VALVE REPAIR; N/A     Comment:  Procedure: MITRAL VALVE  REPLACEMENT using a 27 mm               Edwards Perimount Magna Mitral Ease Valve;  Surgeon:                Rexene Alberts, MD;  Location: [place];  Service: Open               Heart Surgery;  Laterality: N/A; 02/12/2016: SKIN SPLIT GRAFT; Right     Comment:  Procedure: IRRIGATION AND DEBRIDEMENT RIGHT CHEST WOUND;  Surgeon: Loel Lofty Dillingham, DO;  Location: [place];                Service: Plastics;  Laterality: Right; 06/05/2015: STERNAL WIRES REMOVAL; N/A     Comment:  Procedure: STERNAL WIRES REMOVAL;  Surgeon: Rexene Alberts, MD;  Location: [place];  Service: Thoracic;                Laterality: N/A; 05/09/2015: STERNAL WOUND DEBRIDEMENT; N/A     Comment:  Procedure: STERNAL WOUND DEBRIDEMENT;  Surgeon: Rexene Alberts, MD;  Location: [place];  Service: Thoracic;                Laterality: N/A; 11/13/2015: STERNAL WOUND DEBRIDEMENT; N/A     Comment:  Procedure: Excisional drainage of RIGHT Chest wall mass               and breast mass ;  Surgeon: Rexene Alberts, MD;                Location: [place];  Service: Thoracic;  Laterality: N/A; 04/25/2015: TEE WITHOUT CARDIOVERSION; N/A     Comment:  Procedure: TRANSESOPHAGEAL ECHOCARDIOGRAM (TEE);                Surgeon: Rexene Alberts, MD;  Location: [place];  Service:              Open Heart Surgery;  Laterality: N/A; No date: TONSILLECTOMY 11/18/2015: TRAM; Right     Comment:  Procedure: VRAM Vertical Rectus Abdominus Muscle Flap;                Surgeon: Loel Lofty Dillingham, DO;  Location: [place];                Service: Plastics;  Laterality: Right;  RIght Back 04/25/2015: TRICUSPID VALVE REPLACEMENT; N/A     Comment:  Procedure: TRICUSPID VALVE REPAIR;  Surgeon: Rexene Alberts, MD;  Location: [place];  Service: Open Heart Surgery;              Laterality: N/A; No date: VAGINAL HYSTERECTOMY BMI    Body Mass Index:  22.49 kg/m     Reproductive/Obstetrics negative OB ROS                             Anesthesia Physical Anesthesia Plan  ASA: IV  Anesthesia Plan: General    Post-op Pain Management:    Induction:   PONV Risk Score and Plan:   Airway Management Planned:   Additional Equipment:   Intra-op Plan:   Post-operative Plan:   Informed Consent: I have reviewed the patients History and Physical, chart, labs and discussed the procedure including the risks, benefits and alternatives for the proposed anesthesia with the patient or authorized representative who has indicated his/her understanding and acceptance.   Dental Advisory Given  Plan Discussed with: CRNA  Anesthesia Plan Comments:         Anesthesia Quick Evaluation

## 2018-07-20 NOTE — Progress Notes (Signed)
Progress Note  Patient Name: [name] Date of Encounter: 07/20/2018  Primary Cardiologist: Kathlyn Sacramento, MD   Subjective   She continues to have significant shortness of breath at rest although this has improved.  Leg edema has resolved.  Inpatient Medications    Scheduled Meds: . amiodarone  150 mg Intravenous Once  . aspirin EC  81 mg Oral Daily  . bisoprolol  10 mg Oral Daily  . ferrous sulfate  325 mg Oral Q breakfast  . furosemide  40 mg Intravenous BID  . gabapentin  200 mg Oral QHS  . levothyroxine  88 mcg Oral Q0600  . losartan  25 mg Oral Daily  . multivitamin-lutein  2 capsule Oral Daily  . pantoprazole  40 mg Oral BID  . polyethylene glycol  17 g Oral Daily  . potassium chloride SA  40 mEq Oral Daily  . potassium chloride  40 mEq Oral Once  . warfarin  2 mg Oral q1800  . Warfarin - Pharmacist Dosing Inpatient   Does not apply q1800   Continuous Infusions: . amiodarone 30 mg/hr (07/20/18 0006)  . heparin 800 Units/hr (07/20/18 0956)   PRN Meds: acetaminophen **OR** acetaminophen, azelastine, bisacodyl, fluticasone, ipratropium-albuterol, ondansetron **OR** ondansetron (ZOFRAN) IV, senna-docusate   Vital Signs    Vitals:   07/19/18 1544 07/19/18 1940 07/20/18 0438 07/20/18 0739  BP: 119/62 101/69 126/81 110/67  Pulse: 100 (!) 115 81 (!) 117  Resp: (!) 21 17 16 19   Temp: (!) 97.5 F (36.4 C) (!) 97.5 F (36.4 C) (!) 97.5 F (36.4 C) 97.6 F (36.4 C)  TempSrc: Oral Oral Oral Oral  SpO2: 91% 94% 98% 92%  Weight:      Height:        Intake/Output Summary (Last 24 hours) at 07/20/2018 1105 Last data filed at 07/20/2018 1008 Gross per 24 hour  Intake 382.06 ml  Output 2350 ml  Net -1967.94 ml   Filed Weights   07/17/18 1607 07/17/18 2105  Weight: 59 kg 59.4 kg    Telemetry    Atrial fibrillation with ventricular rate between 70 and 100 bpm.- Personally Reviewed  ECG    Not performed today.- Personally Reviewed  Physical Exam    GEN: No acute distress.   Neck:  Mild JVD Cardiac: Irregularly irregular, no murmurs, rubs, or gallops.  Respiratory: Clear to auscultation bilaterally. GI: Soft, nontender, non-distended  MS: No edema; No deformity. Neuro:  Nonfocal  Psych: Normal affect   Labs    Chemistry Recent Labs  Lab 07/17/18 1610 07/18/18 0510 07/20/18 0440  NA 142 143 142  K 4.1 3.9 3.2*  CL 102 102 99  CO2 32 32 35*  GLUCOSE 179* 141* 114*  BUN 25* 20 21  CREATININE 1.06* 0.88 0.82  CALCIUM 9.2 8.9 8.9  GFRNONAA 48* 60* >60  GFRAA 56* >60 >60  ANIONGAP 8 9 8      Hematology Recent Labs  Lab 07/17/18 1610 07/18/18 0510  WBC 7.5 9.7  RBC 3.52* 3.38*  HGB 10.8* 10.1*  HCT 34.8* 33.5*  MCV 98.9 99.1  MCH 30.7 29.9  MCHC 31.0 30.1  RDW 14.3 14.5  PLT 248 227    Cardiac Enzymes Recent Labs  Lab 07/17/18 1844  TROPONINI <0.03   No results for input(s): TROPIPOC in the last 168 hours.   BNP Recent Labs  Lab 07/17/18 1838  BNP 477.0*     DDimer No results for input(s): DDIMER in the last 168 hours.  Radiology    No results found.  Cardiac Studies   Echocardiogram done on 08/01/18:  Study Conclusions  - Left ventricle: The cavity size was normal. There was mild   concentric hypertrophy. Systolic function was severely reduced.   The estimated ejection fraction was in the range of 20% to 25%.   Diffuse hypokinesis. Regional wall motion abnormalities cannot be   excluded. The study is not technically sufficient to allow   evaluation of LV diastolic function. - Mitral valve: Prior procedures included surgical repair. A was   present and functioning normally. The sewing ring appeared   normal. Mean gradient (D): 5 mm Hg. Valve area by continuity   equation (using LVOT flow): 0.99 cm^2. - Left atrium: The atrium was mildly dilated. - Right ventricle: The cavity size was moderately dilated. Wall   thickness was normal. Systolic function was moderately  reduced. - Pulmonary arteries: Systolic pressure could not be accurately   estimated.  Patient Profile     83 y.o. female with a history of atrial fibrillation, mitralvalve stenosisand tricuspid regurgitation status post MV replacement/TVR/maze/left atrial appendage clipping, and CAD status post CABG x1, who presents for follow-up related to a 3-week history of progressive dyspnea and lower extremity swelling.She appears to have afib with RVR as the cause.  Assessment & Plan    1.  Persistent atrial fibrillation: This is the likely culprit for her recent decompensation.  Ventricular rate continues to be not optimally controlled with exertion.  Continue IV amiodarone. The patient is scheduled for cardioversion today at 1 PM.  Her INR has been therapeutic up until this morning when it decreased slightly to 1.97 after warfarin dose was decreased after initiation of amiodarone.  The patient was given an extra dose of warfarin this morning and I elected to start her on unfractionated heparin until we get her INR above 2. I do think we can still proceed with cardioversion without TEE given that her INR is almost therapeutic and it has been over the last 1 month. I suspect she will require continuing amiodarone to maintain her in sinus rhythm.  2.  Acute on chronic systolic heart failure: Drop in ejection fraction is likely due to A. fib with RVR.  She continues to be volume overloaded.  Continue IV furosemide.  3.  Coronary artery disease: Stable with no angina.  4.  Status post mitral valve repair for severe mitral regurgitation: Most recent echo showed mild degree of stenosis which is likely worsened by A. fib with RVR.      For questions or updates, please contact [place] Please consult [email or web address] for contact info under        Signed, Kathlyn Sacramento, MD  07/20/2018, 11:05 AM

## 2018-07-20 NOTE — Progress Notes (Addendum)
[place] for heparin bridge w/ warfarin dosing Indication: atrial fibrillation  Allergies  Allergen Reactions  . Ace Inhibitors Cough and Other (See Comments)  . Amoxicillin-Pot Clavulanate Swelling and Other (See Comments)    JOINTS, PAIN Joint pain   . Penicillins Swelling and Other (See Comments)    SWOLLEN JOINTS  Has patient had a PCN reaction causing immediate rash, facial/tongue/throat swelling, SOB or lightheadedness with hypotension: Yes Has patient had a PCN reaction causing severe rash involving mucus membranes or skin necrosis: No Has patient had a PCN reaction that required hospitalization No Has patient had a PCN reaction occurring within the last 10 years: No If all of the above answers are "NO", then may proceed with Cephalosporin use.  Swollen joints  . Nifedipine Other (See Comments)    UNSPECIFIED UNSPECIFIED   . Propranolol Other (See Comments)    UNSPECIFIED UNSPECIFIED   . Diazepam Other (See Comments)    Made her "hyper" Made her "hyper" Made her "hyper"   . Meperidine Other (See Comments)    Made her "hyper" Made her "hyper" Made her "hyper"   . Propoxyphene Other (See Comments)    Made her "hyper" Made her "hyper" Made her "hyper"     Patient Measurements: Height: 5\' 4"  (162.6 cm) Weight: 131 lb (59.4 kg) IBW/kg (Calculated) : 54.7 Heparin Dosing Weight: 59.4 kg  Vital Signs: Temp: 97.6 F (36.4 C) (11/11 1551) Temp Source: Oral (11/11 1551) BP: 100/69 (11/11 1551) Pulse Rate: 59 (11/11 1551)  Labs: Recent Labs    07/18/18 0510 07/19/18 0450 07/20/18 0440 07/20/18 0929 07/20/18 1830  HGB 10.1*  --   --   --   --   HCT 33.5*  --   --   --   --   PLT 227  --   --   --   --   APTT  --   --   --  48*  --   LABPROT 24.2* 25.2* 22.2*  --   --   INR 2.21 2.33 1.97  --   --   HEPARINUNFRC  --   --   --   --  0.45  CREATININE 0.88  --  0.82  --   --   CKTOTAL  --   --  51  --   --      Estimated Creatinine Clearance: 46.5 mL/min (by C-G formula based on SCr of 0.82 mg/dL).   Medical History: Past Medical History:  Diagnosis Date  . Anxiety   . Arthritis    "hands" (11/10/2015)  . CAD s/p CABG x 1    a. 04/2015 LIMA to LAD  . Chronic combined systolic (congestive) and diastolic (congestive) heart failure ([place])    a. 01/2015 Echo: EF 50-55%, Gr2 DD (preop valve surgery); b. 08/2015 Echo: EF 30-35%, diff HK. Gr2 DD; c. 09/2016 Echo: EF 50-55%. Nl fxning TV ring. Mildly dil LA.  Marland Kitchen COPD (chronic obstructive pulmonary disease) ([place])   . Coronary artery disease   . GERD (gastroesophageal reflux disease)   . History of colon polyps   . History of mitral valve replacement    a. 04/2015 s/p 27 mm [facility] Mitral bovine bioprosthetic tissue valve  . Hyperlipidemia   . Hypertension   . Hypothyroidism   . 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 04/2015 Complete bilateral atrial lesion set using cryothermy and bipolar radiofrequency ablation with clipping of LA appendage  .  Meniere disease   . Meniere's disease   . On home oxygen therapy    "2L at night" (11/10/2015)  . PAF (paroxysmal atrial fibrillation) ([place])   . Pneumonia ~ 2010  . PONV (postoperative nausea and vomiting)    Pt felt like eardrums were gonna pop  . Post-surgical complete heart block, symptomatic    a. 04/2015 s/p MDT [id number] Advisa DR MRI DC PPM.  . Presence of permanent cardiac pacemaker   . Pulmonary hypertension ([place])   . Surgical wound, non healing - chest wall 11/10/2015  . Tricuspid Regurgitation s/p Repair    a. 04/2015 s/p 26 mm Edwards mc3 ring annuloplasty  . Wound infection 10/13/2015   Superficial sternal wound infection    Assessment: 83 year old female with h/o afib on warfarin PTA. Patient's home dose of warfarin is 3.75 mg Tu/W/Th/F/Sat/Sun and 5 mg on Monday. Patient started on amiodarone drip and plan for cardioversion today with continuation of PO amiodarone after cardioversion.  The patient's warfarin was reduced to 2 mg daily due to drug interaction with amiodarone. Today's INR subtherapeutic at 1.97. Pharmacy consulted for heparin drip prior to cardioversion.  Anticipate home dose of warfarin 3 mg daily except 2 mg on Tuesday and Thursday. This represents an approximate 30% decrease from the patient's PTA regimen to account for the new drug interaction with amiodarone.  Gave heparin 2900 unit bolus and start heparin drip at 800 units/hr.11/11 1830 level returned at 0.45 (within goal).    Goal of Therapy:  Heparin level 0.3-0.7 units/ml  INR 2-3 Monitor platelets by anticoagulation protocol: Yes   Plan:  Will continue the heparin at current rate. Plan to continue heparin drip post-cardioversion until INR therapeutic. Will order a heparin level 11/12 @0230   INR 1.97 subtherapeutic. Will gave warfarin 3 mg on 11/11 per Dr. Fletcher Anon. Will plan to transition patient to the above warfarin regimen starting tomorrow.  INR and CBC ordered with morning labs.  Oswald Hillock, PharmD Clinical Pharmacist  07/20/2018 7:10 PM

## 2018-07-20 NOTE — Progress Notes (Signed)
Return from cardioversion

## 2018-07-20 NOTE — Transfer of Care (Signed)
Immediate Anesthesia Transfer of Care Note  Patient: [name]  Procedure(s) Performed: CARDIOVERSION (N/A )  Patient Location: PACU  Anesthesia Type:General  Level of Consciousness: sedated  Airway & Oxygen Therapy: Patient Spontanous Breathing and Patient connected to nasal cannula oxygen  Post-op Assessment: Report given to RN and Post -op Vital signs reviewed and stable  Post vital signs: Reviewed and stable  Last Vitals:  Vitals Value Taken Time  BP 100/48   Temp    Pulse 73 07/20/2018  1:23 PM  Resp 27 07/20/2018  1:23 PM  SpO2 98 % 07/20/2018  1:23 PM  Vitals shown include unvalidated device data.  Last Pain:  Vitals:   07/20/18 1243  TempSrc:   PainSc: 0-No pain         Complications: No apparent anesthesia complications

## 2018-07-20 NOTE — Progress Notes (Signed)
Patient ID: [name], female   DOB: 1934/12/04, 83 y.o.   MRN: [id number]   [facility] PROGRESS NOTE  [name] SWN:[id number] DOB: 31-Mar-1935 DOA: 07/17/2018 PCP: Ezequiel Kayser, MD  HPI/Subjective: Patient was seen this morning prior to cardioversion.  At that time she was feeling short of breath with limited movement.  She was having some coughing.  Objective: Vitals:   07/20/18 1435 07/20/18 1551  BP: 107/64 100/69  Pulse: 60 (!) 59  Resp: 18 19  Temp: (!) 97.5 F (36.4 C) 97.6 F (36.4 C)  SpO2: 95% 92%    Filed Weights   07/17/18 1607 07/17/18 2105  Weight: 59 kg 59.4 kg    ROS: Review of Systems  Constitutional: Positive for malaise/fatigue. Negative for chills and fever.  Eyes: Negative for blurred vision.  Respiratory: Negative for cough and shortness of breath.   Cardiovascular: Negative for chest pain and palpitations.  Gastrointestinal: Negative for abdominal pain, constipation, diarrhea, nausea and vomiting.  Genitourinary: Negative for dysuria.  Musculoskeletal: Negative for joint pain.  Neurological: Negative for dizziness and headaches.   Exam: Physical Exam  Constitutional: She is oriented to person, place, and time.  HENT:  Nose: No mucosal edema.  Mouth/Throat: No oropharyngeal exudate or posterior oropharyngeal edema.  Eyes: Pupils are equal, round, and reactive to light. Conjunctivae, EOM and lids are normal.  Neck: No JVD present. Carotid bruit is not present. No edema present. No thyroid mass and no thyromegaly present.  Cardiovascular: S1 normal and S2 normal. An irregularly irregular rhythm present. Exam reveals no gallop.  No murmur heard. Pulses:      Dorsalis pedis pulses are 2+ on the right side, and 2+ on the left side.  Respiratory: No respiratory distress. She has no wheezes. She has no rhonchi. She has no rales.  GI: Soft. Bowel sounds are normal. There is no tenderness.  Musculoskeletal:       Right ankle: She exhibits no  swelling.       Left ankle: She exhibits no swelling.  Lymphadenopathy:    She has no cervical adenopathy.  Neurological: She is alert and oriented to person, place, and time. No cranial nerve deficit.  Skin: Skin is warm. No rash noted. Nails show no clubbing.  Psychiatric: She has a normal mood and affect.      Data Reviewed: Basic Metabolic Panel: Recent Labs  Lab 07/17/18 1610 07/18/18 0510 07/20/18 0440  NA 142 143 142  K 4.1 3.9 3.2*  CL 102 102 99  CO2 32 32 35*  GLUCOSE 179* 141* 114*  BUN 25* 20 21  CREATININE 1.06* 0.88 0.82  CALCIUM 9.2 8.9 8.9   CBC: Recent Labs  Lab 07/17/18 1610 07/18/18 0510  WBC 7.5 9.7  HGB 10.8* 10.1*  HCT 34.8* 33.5*  MCV 98.9 99.1  PLT 248 227   Cardiac Enzymes: Recent Labs  Lab 07/17/18 1844 07/20/18 0440  CKTOTAL  --  51  TROPONINI <0.03  --    BNP (last 3 results) Recent Labs    07/17/18 1838  BNP 477.0*      Studies: No results found.  Scheduled Meds: . amiodarone  150 mg Intravenous Once  . aspirin EC  81 mg Oral Daily  . bisoprolol  10 mg Oral Daily  . ferrous sulfate  325 mg Oral Q breakfast  . furosemide  40 mg Intravenous BID  . gabapentin  200 mg Oral QHS  . levothyroxine  88 mcg Oral Q0600  .  losartan  25 mg Oral Daily  . multivitamin-lutein  2 capsule Oral Daily  . pantoprazole  40 mg Oral BID  . polyethylene glycol  17 g Oral Daily  . potassium chloride SA  40 mEq Oral Daily  . Warfarin - Pharmacist Dosing Inpatient   Does not apply q1800   Continuous Infusions: . amiodarone 30 mg/hr (07/20/18 1107)  . heparin 800 Units/hr (07/20/18 0956)    Assessment/Plan:  1. Atrial fibrillation with rapid ventricular response.  Continue amiodarone drip.  Patient therapeutic on Coumadin.  Continue bisoprolol.  Cardiology cardioverted today.  They recommended continuing IV amiodarone tonight and conversion to oral amiodarone tomorrow. 2. Acute on chronic systolic congestive heart failure.  Cardiology  increase Lasix to 40 mg IV twice daily.  Continue bisoprolol.  Decreased the dose of losartan with relative hypotension. 3. COPD.  Continue DuoNeb nebulizer standing dose.  Oxygen at night for sleep apnea. 4. Sleep apnea.  Oxygen at night. 5. History of coronary artery disease 6. Hyperlipidemia unspecified.  I will hold Zocor at this time with the patient's weakness.  Could be secondary to statin. 7. GERD on Protonix 8. Hypothyroidism unspecified on Synthroid 9. Impaired fasting glucose.  Patient not a diabetic hemoglobin A1c 6.0.  Code Status:     Code Status Orders  (From admission, onward)         Start     Ordered   07/17/18 2000  Do not attempt resuscitation (DNR)  Continuous    Question Answer Comment  In the event of cardiac or respiratory ARREST Do not call a "code blue"   In the event of cardiac or respiratory ARREST Do not perform Intubation, CPR, defibrillation or ACLS   In the event of cardiac or respiratory ARREST Use medication by any route, position, wound care, and other measures to relive pain and suffering. May use oxygen, suction and manual treatment of airway obstruction as needed for comfort.   Comments nurse may pronounce      07/17/18 2000        Code Status History    Date Active Date Inactive Code Status Order ID Comments User Context   06/29/2016 1240 07/01/2016 1707 Full Code [id number]  Henreitta Leber, MD Inpatient   11/10/2015 1242 11/27/2015 1552 Full Code [id number]  John Giovanni, PA-C ED   05/02/2015 2202 05/11/2015 1907 Full Code [id number]  Thompson Grayer, MD Inpatient   04/25/2015 1629 05/02/2015 2202 Full Code [id number]  Rexene Alberts, MD Inpatient    Advance Directive Documentation     Most Recent Value  Type of Advance Directive  Healthcare Power of Attorney, Living will  Pre-existing out of facility DNR order (yellow form or pink MOST form)  -  "MOST" Form in Place?  -     Family Communication: Family yesterday Disposition Plan: To be  determined  Consultants:  Cardiology  Time spent: 26 minutes  [place]

## 2018-07-21 ENCOUNTER — Other Ambulatory Visit: Payer: Self-pay

## 2018-07-21 ENCOUNTER — Encounter: Payer: Self-pay | Admitting: Cardiovascular Disease

## 2018-07-21 DIAGNOSIS — I38 Endocarditis, valve unspecified: Secondary | ICD-10-CM

## 2018-07-21 DIAGNOSIS — I2510 Atherosclerotic heart disease of native coronary artery without angina pectoris: Secondary | ICD-10-CM

## 2018-07-21 DIAGNOSIS — I4819 Other persistent atrial fibrillation: Principal | ICD-10-CM

## 2018-07-21 LAB — BASIC METABOLIC PANEL
ANION GAP: 7 (ref 5–15)
BUN: 21 mg/dL (ref 8–23)
CHLORIDE: 104 mmol/L (ref 98–111)
CO2: 31 mmol/L (ref 22–32)
Calcium: 8.8 mg/dL — ABNORMAL LOW (ref 8.9–10.3)
Creatinine, Ser: 0.8 mg/dL (ref 0.44–1.00)
GFR calc non Af Amer: >60 mL/min (ref >60)
Glucose, Bld: 120 mg/dL — ABNORMAL HIGH (ref 70–99)
POTASSIUM: 3.7 mmol/L (ref 3.5–5.1)
Sodium: 142 mmol/L (ref 135–145)

## 2018-07-21 LAB — PROTIME-INR
INR: 2
Prothrombin Time: 22.4 seconds — ABNORMAL HIGH (ref 11.4–15.2)

## 2018-07-21 LAB — CBC
HEMATOCRIT: 33.7 % — AB (ref 36.0–46.0)
HEMOGLOBIN: 10.3 g/dL — AB (ref 12.0–15.0)
MCH: 29.8 pg (ref 26.0–34.0)
MCHC: 30.6 g/dL (ref 30.0–36.0)
MCV: 97.4 fL (ref 80.0–100.0)
NRBC: 0 % (ref 0.0–0.2)
Platelets: 238 10*3/uL (ref 150–400)
RBC: 3.46 MIL/uL — AB (ref 3.87–5.11)
RDW: 14.7 % (ref 11.5–15.5)
WBC: 7.6 10*3/uL (ref 4.0–10.5)

## 2018-07-21 LAB — HEPARIN LEVEL (UNFRACTIONATED): Heparin Unfractionated: 0.58 IU/mL (ref 0.30–0.70)

## 2018-07-21 MED ORDER — WARFARIN SODIUM 3 MG PO TABS
3.0000 mg | ORAL_TABLET | Freq: Once | ORAL | Status: AC
  Administered 2018-07-21: 3 mg via ORAL
  Filled 2018-07-21: qty 1

## 2018-07-21 MED ORDER — LORAZEPAM 1 MG PO TABS
1.0000 mg | ORAL_TABLET | Freq: Once | ORAL | Status: AC
  Administered 2018-07-21: 1 mg via ORAL
  Filled 2018-07-21: qty 1

## 2018-07-21 MED ORDER — AMIODARONE HCL 200 MG PO TABS
200.0000 mg | ORAL_TABLET | Freq: Every day | ORAL | Status: DC
  Administered 2018-07-21 – 2018-07-22 (×2): 200 mg via ORAL
  Filled 2018-07-21 (×2): qty 1

## 2018-07-21 NOTE — Progress Notes (Addendum)
[place] for heparin bridge w/ warfarin dosing Indication: atrial fibrillation  Allergies  Allergen Reactions  . Ace Inhibitors Cough and Other (See Comments)  . Amoxicillin-Pot Clavulanate Swelling and Other (See Comments)    JOINTS, PAIN Joint pain   . Penicillins Swelling and Other (See Comments)    SWOLLEN JOINTS  Has patient had a PCN reaction causing immediate rash, facial/tongue/throat swelling, SOB or lightheadedness with hypotension: Yes Has patient had a PCN reaction causing severe rash involving mucus membranes or skin necrosis: No Has patient had a PCN reaction that required hospitalization No Has patient had a PCN reaction occurring within the last 10 years: No If all of the above answers are "NO", then may proceed with Cephalosporin use.  Swollen joints  . Nifedipine Other (See Comments)    UNSPECIFIED UNSPECIFIED   . Propranolol Other (See Comments)    UNSPECIFIED UNSPECIFIED   . Diazepam Other (See Comments)    Made her "hyper" Made her "hyper" Made her "hyper"   . Meperidine Other (See Comments)    Made her "hyper" Made her "hyper" Made her "hyper"   . Propoxyphene Other (See Comments)    Made her "hyper" Made her "hyper" Made her "hyper"     Patient Measurements: Height: 5\' 4"  (162.6 cm) Weight: 131 lb (59.4 kg) IBW/kg (Calculated) : 54.7 Heparin Dosing Weight: 59.4 kg  Vital Signs: Temp: 97.3 F (36.3 C) (11/12 0736) Temp Source: Oral (11/12 0736) BP: 136/71 (11/12 0737) Pulse Rate: 60 (11/12 0737)  Labs: Recent Labs    07/19/18 0450 07/20/18 0440 07/20/18 0929 07/20/18 1830 07/21/18 0235  HGB  --   --   --   --  10.3*  HCT  --   --   --   --  33.7*  PLT  --   --   --   --  238  APTT  --   --  48*  --   --   LABPROT 25.2* 22.2*  --   --  22.4*  INR 2.33 1.97  --   --  2.00  HEPARINUNFRC  --   --   --  0.45 0.58  CREATININE  --  0.82  --   --  0.80  CKTOTAL  --  51  --   --   --      Estimated Creatinine Clearance: 47.6 mL/min (by C-G formula based on SCr of 0.8 mg/dL).   Medical History: Past Medical History:  Diagnosis Date  . Anxiety   . Arthritis    "hands" (11/10/2015)  . CAD s/p CABG x 1    a. 04/2015 LIMA to LAD  . Chronic combined systolic (congestive) and diastolic (congestive) heart failure ([place])    a. 01/2015 Echo: EF 50-55%, Gr2 DD (preop valve surgery); b. 08/2015 Echo: EF 30-35%, diff HK. Gr2 DD; c. 09/2016 Echo: EF 50-55%. Nl fxning TV ring. Mildly dil LA.  Marland Kitchen COPD (chronic obstructive pulmonary disease) ([place])   . Coronary artery disease   . GERD (gastroesophageal reflux disease)   . History of colon polyps   . History of mitral valve replacement    a. 04/2015 s/p 27 mm [facility] Mitral bovine bioprosthetic tissue valve  . Hyperlipidemia   . Hypertension   . Hypothyroidism   . 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 04/2015 Complete bilateral atrial lesion set using cryothermy and bipolar radiofrequency ablation with clipping of LA appendage  . Meniere disease   .  Meniere's disease   . On home oxygen therapy    "2L at night" (11/10/2015)  . PAF (paroxysmal atrial fibrillation) ([place])   . Pneumonia ~ 2010  . PONV (postoperative nausea and vomiting)    Pt felt like eardrums were gonna pop  . Post-surgical complete heart block, symptomatic    a. 04/2015 s/p MDT [id number] Advisa DR MRI DC PPM.  . Presence of permanent cardiac pacemaker   . Pulmonary hypertension ([place])   . Surgical wound, non healing - chest wall 11/10/2015  . Tricuspid Regurgitation s/p Repair    a. 04/2015 s/p 26 mm Edwards mc3 ring annuloplasty  . Wound infection 10/13/2015   Superficial sternal wound infection    Assessment: 83 year old female with h/o afib on warfarin PTA. Patient's home dose of warfarin is 3.75 mg daily except 5 mg on Mondays. Patient started on amiodarone drip and underwent cardioversion 11/11. Patient is now continuing on PO amiodarone 200 mg daily. The  patient's warfarin was previously reduced to 2 mg daily for drug interaction with amiodarone and the INR remained therapeutic. INR 11/11 subtherapeutic at 1.97. Pharmacy was consulted for heparin drip prior to cardioversion.  Anticipate home dose of warfarin 3 mg daily except 2 mg two days a week. This represents an approximate 30% decrease from the patient's PTA regimen to account for the new drug interaction with amiodarone.  Goal of Therapy:  Heparin level 0.3-0.7 units/ml  INR 2-3 Monitor platelets by anticoagulation protocol: Yes   Plan:  Per Cardiology, heparin to continue until discharge (if patient leaves today) or until repeat INR 2 or greater. Will continue heparin drip at 800 units/hr.  INR 2 therapeutic, although on the lower end. Will give warfarin 3 mg tonight at 1800.  Heparin level, INR and CBC ordered with morning labs.  Tawnya Crook, PharmD Pharmacy Resident  07/21/2018 1:12 PM

## 2018-07-21 NOTE — Progress Notes (Signed)
Patient has rested quietly this day with visitors throughout the shift. Shortness of breath in the morning that has been improving throughout the day. She was able to ambulated a lab around the nurses station on room air with standby assist from nursing. Amiodarone transitioned to oral tablets. Patient hopefull for discharge tomorrow.

## 2018-07-21 NOTE — Progress Notes (Signed)
[place] for heparin bridge w/ warfarin dosing Indication: atrial fibrillation  Allergies  Allergen Reactions  . Ace Inhibitors Cough and Other (See Comments)  . Amoxicillin-Pot Clavulanate Swelling and Other (See Comments)    JOINTS, PAIN Joint pain   . Penicillins Swelling and Other (See Comments)    SWOLLEN JOINTS  Has patient had a PCN reaction causing immediate rash, facial/tongue/throat swelling, SOB or lightheadedness with hypotension: Yes Has patient had a PCN reaction causing severe rash involving mucus membranes or skin necrosis: No Has patient had a PCN reaction that required hospitalization No Has patient had a PCN reaction occurring within the last 10 years: No If all of the above answers are "NO", then may proceed with Cephalosporin use.  Swollen joints  . Nifedipine Other (See Comments)    UNSPECIFIED UNSPECIFIED   . Propranolol Other (See Comments)    UNSPECIFIED UNSPECIFIED   . Diazepam Other (See Comments)    Made her "hyper" Made her "hyper" Made her "hyper"   . Meperidine Other (See Comments)    Made her "hyper" Made her "hyper" Made her "hyper"   . Propoxyphene Other (See Comments)    Made her "hyper" Made her "hyper" Made her "hyper"     Patient Measurements: Height: 5\' 4"  (162.6 cm) Weight: 131 lb (59.4 kg) IBW/kg (Calculated) : 54.7 Heparin Dosing Weight: 59.4 kg  Vital Signs: Temp: 98 F (36.7 C) (11/11 1943) Temp Source: Oral (11/11 1943) BP: 110/49 (11/11 1943) Pulse Rate: 59 (11/11 1943)  Labs: Recent Labs    07/18/18 0510 07/19/18 0450 07/20/18 0440 07/20/18 0929 07/20/18 1830 07/21/18 0235  HGB 10.1*  --   --   --   --  10.3*  HCT 33.5*  --   --   --   --  33.7*  PLT 227  --   --   --   --  238  APTT  --   --   --  48*  --   --   LABPROT 24.2* 25.2* 22.2*  --   --  22.4*  INR 2.21 2.33 1.97  --   --  2.00  HEPARINUNFRC  --   --   --   --  0.45 0.58  CREATININE 0.88  --  0.82   --   --  0.80  CKTOTAL  --   --  51  --   --   --     Estimated Creatinine Clearance: 47.6 mL/min (by C-G formula based on SCr of 0.8 mg/dL).   Medical History: Past Medical History:  Diagnosis Date  . Anxiety   . Arthritis    "hands" (11/10/2015)  . CAD s/p CABG x 1    a. 04/2015 LIMA to LAD  . Chronic combined systolic (congestive) and diastolic (congestive) heart failure ([place])    a. 01/2015 Echo: EF 50-55%, Gr2 DD (preop valve surgery); b. 08/2015 Echo: EF 30-35%, diff HK. Gr2 DD; c. 09/2016 Echo: EF 50-55%. Nl fxning TV ring. Mildly dil LA.  Marland Kitchen COPD (chronic obstructive pulmonary disease) ([place])   . Coronary artery disease   . GERD (gastroesophageal reflux disease)   . History of colon polyps   . History of mitral valve replacement    a. 04/2015 s/p 27 mm [facility] Mitral bovine bioprosthetic tissue valve  . Hyperlipidemia   . Hypertension   . Hypothyroidism   . 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 04/2015 Complete bilateral atrial  lesion set using cryothermy and bipolar radiofrequency ablation with clipping of LA appendage  . Meniere disease   . Meniere's disease   . On home oxygen therapy    "2L at night" (11/10/2015)  . PAF (paroxysmal atrial fibrillation) ([place])   . Pneumonia ~ 2010  . PONV (postoperative nausea and vomiting)    Pt felt like eardrums were gonna pop  . Post-surgical complete heart block, symptomatic    a. 04/2015 s/p MDT [id number] Advisa DR MRI DC PPM.  . Presence of permanent cardiac pacemaker   . Pulmonary hypertension ([place])   . Surgical wound, non healing - chest wall 11/10/2015  . Tricuspid Regurgitation s/p Repair    a. 04/2015 s/p 26 mm Edwards mc3 ring annuloplasty  . Wound infection 10/13/2015   Superficial sternal wound infection    Assessment: 83 year old female with h/o afib on warfarin PTA. Patient's home dose of warfarin is 3.75 mg Tu/W/Th/F/Sat/Sun and 5 mg on Monday. Patient started on amiodarone drip and plan for cardioversion today with  continuation of PO amiodarone after cardioversion. The patient's warfarin was reduced to 2 mg daily due to drug interaction with amiodarone. Today's INR subtherapeutic at 1.97. Pharmacy consulted for heparin drip prior to cardioversion.  Anticipate home dose of warfarin 3 mg daily except 2 mg on Tuesday and Thursday. This represents an approximate 30% decrease from the patient's PTA regimen to account for the new drug interaction with amiodarone.  Gave heparin 2900 unit bolus and start heparin drip at 800 units/hr.11/11 1830 level returned at 0.45 (within goal).    Goal of Therapy:  Heparin level 0.3-0.7 units/ml  INR 2-3 Monitor platelets by anticoagulation protocol: Yes   Plan:  Will continue the heparin at current rate. Plan to continue heparin drip post-cardioversion until INR therapeutic. Will order a heparin level 11/12 @0230   11/12 0230 heparin level 0.58. Continue current regimen. Recheck heparin level and CBC with tomorrow AM labs.  INR 1.97 subtherapeutic. Will gave warfarin 3 mg on 11/11 per Dr. Fletcher Anon. Will plan to transition patient to the above warfarin regimen starting tomorrow.  INR and CBC ordered with morning labs.  Eloise Harman, PharmD Clinical Pharmacist  07/21/2018 3:58 AM

## 2018-07-21 NOTE — Progress Notes (Signed)
Progress Note  Patient Name: [name] Date of Encounter: 07/21/2018  Primary Cardiologist: Kathlyn Sacramento, MD  Subjective   Patient feels week and short of breath with activity but improved since DCCV yesterday.  No chest pain.  Inpatient Medications    Scheduled Meds: . amiodarone  150 mg Intravenous Once  . aspirin EC  81 mg Oral Daily  . bisoprolol  10 mg Oral Daily  . ferrous sulfate  325 mg Oral Q breakfast  . furosemide  40 mg Intravenous BID  . gabapentin  200 mg Oral QHS  . levothyroxine  88 mcg Oral Q0600  . losartan  25 mg Oral Daily  . multivitamin-lutein  2 capsule Oral Daily  . pantoprazole  40 mg Oral BID  . polyethylene glycol  17 g Oral Daily  . potassium chloride SA  40 mEq Oral Daily  . warfarin  3 mg Oral ONCE-1800  . Warfarin - Pharmacist Dosing Inpatient   Does not apply q1800   Continuous Infusions: . amiodarone 30 mg/hr (07/20/18 2319)  . heparin 800 Units/hr (07/20/18 0956)   PRN Meds: acetaminophen **OR** acetaminophen, azelastine, bisacodyl, fluticasone, ipratropium-albuterol, ondansetron **OR** ondansetron (ZOFRAN) IV, senna-docusate   Vital Signs    Vitals:   07/20/18 1943 07/21/18 0446 07/21/18 0736 07/21/18 0737  BP: (!) 110/49 131/68 (!) 111/101 136/71  Pulse: (!) 59 61 60 60  Resp: 18 18 19    Temp: 98 F (36.7 C) (!) 97.5 F (36.4 C) (!) 97.3 F (36.3 C)   TempSrc: Oral Oral Oral   SpO2: 92% 97% 96%   Weight:  59.4 kg    Height:        Intake/Output Summary (Last 24 hours) at 07/21/2018 1136 Last data filed at 07/21/2018 1015 Gross per 24 hour  Intake 463.93 ml  Output 1500 ml  Net -1036.07 ml   Filed Weights   07/17/18 1607 07/17/18 2105 07/21/18 0446  Weight: 59 kg 59.4 kg 59.4 kg    Telemetry    AV-paced - Personally Reviewed  ECG    No new tracing  Physical Exam   GEN: No acute distress.   Neck: No JVD Cardiac: RRR, 1/6 systolic murmur Respiratory: Clear to auscultation bilaterally. GI: Soft,  nontender, non-distended  MS: No edema; No deformity. Neuro:  Nonfocal  Psych: Normal affect   Labs    Chemistry Recent Labs  Lab 07/18/18 0510 07/20/18 0440 07/21/18 0235  NA 143 142 142  K 3.9 3.2* 3.7  CL 102 99 104  CO2 32 35* 31  GLUCOSE 141* 114* 120*  BUN 20 21 21   CREATININE 0.88 0.82 0.80  CALCIUM 8.9 8.9 8.8*  GFRNONAA 60* >60 >60  GFRAA >60 >60 >60  ANIONGAP 9 8 7      Hematology Recent Labs  Lab 07/17/18 1610 07/18/18 0510 07/21/18 0235  WBC 7.5 9.7 7.6  RBC 3.52* 3.38* 3.46*  HGB 10.8* 10.1* 10.3*  HCT 34.8* 33.5* 33.7*  MCV 98.9 99.1 97.4  MCH 30.7 29.9 29.8  MCHC 31.0 30.1 30.6  RDW 14.3 14.5 14.7  PLT 248 227 238    Cardiac Enzymes Recent Labs  Lab 07/17/18 1844  TROPONINI <0.03   No results for input(s): TROPIPOC in the last 168 hours.   BNP Recent Labs  Lab 07/17/18 1838  BNP 477.0*     DDimer No results for input(s): DDIMER in the last 168 hours.   Radiology    No results found.  Cardiac Studies   Echocardiogram  done on July 17, 2018:  Study Conclusions  - Left ventricle: The cavity size was normal. There was mild concentric hypertrophy. Systolic function was severely reduced. The estimated ejection fraction was in the range of 20% to 25%. Diffuse hypokinesis. Regional wall motion abnormalities cannot be excluded. The study is not technically sufficient to allow evaluation of LV diastolic function. - Mitral valve: Prior procedures included surgical repair. A was present and functioning normally. The sewing ring appeared normal. Mean gradient (D): 5 mm Hg. Valve area by continuity equation (using LVOT flow): 0.99 cm^2. - Left atrium: The atrium was mildly dilated. - Right ventricle: The cavity size was moderately dilated. Wall thickness was normal. Systolic function was moderately reduced. - Pulmonary arteries: Systolic pressure could not be accurately estimated.  Patient Profile     83  y.o. female with a history of atrial fibrillation, mitralvalve stenosisand tricuspid regurgitation status post MV replacement/TVR/maze/left atrial appendage clipping, and CAD status post CABG x1, who presents for follow-up related to a 3-week history of progressive dyspnea and lower extremity swelling.She appears to have afib with RVR as the cause.  Assessment & Plan    Persistent atrial fibrillation The patient underwent DCCV yesterday and has remained out of atrial fibrillation (predominantly AV-paced).  INR this morning is slightly higher at 2.0.  She remains on IV heparin and amiodarone.  Continue IV heparin until patient is discharge (if going home later today) or repeat INR greater than or equal to 2 again tomorrow.  Will transition IV amiodarone to amiodarone 200 mg daily, as the patient has received almost 5 gm of IV amiodarone since her admission.  Patient needs outpatient INR check within a week.  Acute on chronic systolic heart failure Patient still with class III symptoms but appears euvolemic on exam.  Underlying cause felt to be due to tachycardia in the setting of atrial fibrillation.  Transition to furosemide 40 mg PO BID.  Continue bisoprolol and losartan.  Coronary artery disease No angina.  Continue secondary prevention.  No plan for ischemia evaluation at this time.  Valvular heart disease Patient is status post mitral valve repair for severe MR.  Most recent echo showed mildly elevated transmitral gradient, likely accentuated by a-fib with RVR.  Maintain sinus rhythm.  Continue warfarin and ASA.    For questions or updates, please contact [place] Please consult [email or web address] for contact info under [facility].    Signed, Nelva Bush, MD  07/21/2018, 11:36 AM

## 2018-07-21 NOTE — Progress Notes (Signed)
Patient ID: [name], female   DOB: 1934/11/13, 83 y.o.   MRN: [id number]   [facility] PROGRESS NOTE  [name] [facility]:[id number] DOB: [birth year] DOA: 07/17/2018 PCP: Ezequiel Kayser, MD  HPI/Subjective: Patient still little short of breath.  She was seen this morning.  Still feels a little tired.  No further palpitations.  Objective: Vitals:   07/21/18 0736 07/21/18 0737  BP: (!) 111/101 136/71  Pulse: 60 60  Resp: 19   Temp: (!) 97.3 F (36.3 C)   SpO2: 96%     Filed Weights   07/17/18 1607 07/17/18 2105 07/21/18 0446  Weight: 59 kg 59.4 kg 59.4 kg    ROS: Review of Systems  Constitutional: Positive for malaise/fatigue. Negative for chills and fever.  Eyes: Negative for blurred vision.  Respiratory: Positive for shortness of breath. Negative for cough.   Cardiovascular: Negative for chest pain and palpitations.  Gastrointestinal: Negative for abdominal pain, constipation, diarrhea, nausea and vomiting.  Genitourinary: Negative for dysuria.  Musculoskeletal: Negative for joint pain.  Neurological: Negative for dizziness and headaches.   Exam: Physical Exam  Constitutional: She is oriented to person, place, and time.  HENT:  Nose: No mucosal edema.  Mouth/Throat: No oropharyngeal exudate or posterior oropharyngeal edema.  Eyes: Pupils are equal, round, and reactive to light. Conjunctivae, EOM and lids are normal.  Neck: No JVD present. Carotid bruit is not present. No edema present. No thyroid mass and no thyromegaly present.  Cardiovascular: Regular rhythm, S1 normal and S2 normal. Exam reveals no gallop.  No murmur heard. Pulses:      Dorsalis pedis pulses are 2+ on the right side, and 2+ on the left side.  Respiratory: No respiratory distress. She has no wheezes. She has no rhonchi. She has no rales.  GI: Soft. Bowel sounds are normal. There is no tenderness.  Musculoskeletal:       Right ankle: She exhibits no swelling.       Left ankle: She exhibits no  swelling.  Lymphadenopathy:    She has no cervical adenopathy.  Neurological: She is alert and oriented to person, place, and time. No cranial nerve deficit.  Skin: Skin is warm. No rash noted. Nails show no clubbing.  Psychiatric: She has a normal mood and affect.      Data Reviewed: Basic Metabolic Panel: Recent Labs  Lab 07/17/18 1610 07/18/18 0510 07/20/18 0440 07/21/18 0235  NA 142 143 142 142  K 4.1 3.9 3.2* 3.7  CL 102 102 99 104  CO2 32 32 35* 31  GLUCOSE 179* 141* 114* 120*  BUN 25* 20 21 21   CREATININE 1.06* 0.88 0.82 0.80  CALCIUM 9.2 8.9 8.9 8.8*   CBC: Recent Labs  Lab 07/17/18 1610 07/18/18 0510 07/21/18 0235  WBC 7.5 9.7 7.6  HGB 10.8* 10.1* 10.3*  HCT 34.8* 33.5* 33.7*  MCV 98.9 99.1 97.4  PLT 248 227 238   Cardiac Enzymes: Recent Labs  Lab 07/17/18 1844 07/20/18 0440  CKTOTAL  --  51  TROPONINI <0.03  --    BNP (last 3 results) Recent Labs    07/17/18 1838  BNP 477.0*      Studies: No results found.  Scheduled Meds: . amiodarone  200 mg Oral Daily  . aspirin EC  81 mg Oral Daily  . bisoprolol  10 mg Oral Daily  . ferrous sulfate  325 mg Oral Q breakfast  . furosemide  40 mg Intravenous BID  . gabapentin  200 mg  Oral QHS  . levothyroxine  88 mcg Oral Q0600  . losartan  25 mg Oral Daily  . multivitamin-lutein  2 capsule Oral Daily  . pantoprazole  40 mg Oral BID  . polyethylene glycol  17 g Oral Daily  . potassium chloride SA  40 mEq Oral Daily  . warfarin  3 mg Oral ONCE-1800  . Warfarin - Pharmacist Dosing Inpatient   Does not apply q1800   Continuous Infusions: . heparin 800 Units/hr (07/21/18 1305)    Assessment/Plan:  1. Atrial fibrillation with rapid ventricular response.  Patient was cardioverted and remains in normal sinus rhythm.  Amiodarone drip switched over to oral amiodarone by cardiology.  Patient therapeutic on Coumadin.  Continue bisoprolol.  Cardiology recommended the heparin drip for now. 2. Acute on  chronic systolic congestive heart failure.  Continue Lasix to 40 mg IV twice daily.  Continue bisoprolol.  Decreased the dose of losartan with relative hypotension. 3. COPD.  Continue DuoNeb nebulizer standing dose.  Oxygen at night for sleep apnea. 4. Sleep apnea.  Oxygen at night. 5. History of coronary artery disease 6. Hyperlipidemia unspecified.  I will hold Zocor at this time with the patient's weakness.  Could be secondary to statin. 7. GERD on Protonix 8. Hypothyroidism unspecified on Synthroid 9. Impaired fasting glucose.  Patient not a diabetic hemoglobin A1c 6.0.  Code Status:     Code Status Orders  (From admission, onward)         Start     Ordered   07/17/18 2000  Do not attempt resuscitation (DNR)  Continuous    Question Answer Comment  In the event of cardiac or respiratory ARREST Do not call a "code blue"   In the event of cardiac or respiratory ARREST Do not perform Intubation, CPR, defibrillation or ACLS   In the event of cardiac or respiratory ARREST Use medication by any route, position, wound care, and other measures to relive pain and suffering. May use oxygen, suction and manual treatment of airway obstruction as needed for comfort.   Comments nurse may pronounce      07/17/18 2000        Code Status History    Date Active Date Inactive Code Status Order ID Comments User Context   06/29/2016 1240 07/01/2016 1707 Full Code [id number]  Henreitta Leber, MD Inpatient   11/10/2015 1242 11/27/2015 1552 Full Code [id number]  John Giovanni, PA-C ED   05/02/2015 2202 05/11/2015 1907 Full Code [id number]  Thompson Grayer, MD Inpatient   04/25/2015 1629 05/02/2015 2202 Full Code [id number]  Rexene Alberts, MD Inpatient    Advance Directive Documentation     Most Recent Value  Type of Advance Directive  Healthcare Power of Attorney, Living will  Pre-existing out of facility DNR order (yellow form or pink MOST form)  -  "MOST" Form in Place?  -     Family Communication:  Family today Disposition Plan: To be determined  Consultants:  Cardiology  Time spent: 27 minutes  [name]

## 2018-07-22 ENCOUNTER — Telehealth: Payer: Self-pay

## 2018-07-22 ENCOUNTER — Inpatient Hospital Stay: Payer: Medicare Other

## 2018-07-22 LAB — PROTIME-INR
INR: 1.94
PROTHROMBIN TIME: 21.9 s — AB (ref 11.4–15.2)

## 2018-07-22 LAB — CBC
HEMATOCRIT: 32.3 % — AB (ref 36.0–46.0)
Hemoglobin: 9.9 g/dL — ABNORMAL LOW (ref 12.0–15.0)
MCH: 29.9 pg (ref 26.0–34.0)
MCHC: 30.7 g/dL (ref 30.0–36.0)
MCV: 97.6 fL (ref 80.0–100.0)
NRBC: 0 % (ref 0.0–0.2)
PLATELETS: 225 10*3/uL (ref 150–400)
RBC: 3.31 MIL/uL — ABNORMAL LOW (ref 3.87–5.11)
RDW: 14.8 % (ref 11.5–15.5)
WBC: 7 10*3/uL (ref 4.0–10.5)

## 2018-07-22 LAB — HEPARIN LEVEL (UNFRACTIONATED): HEPARIN UNFRACTIONATED: 0.64 [IU]/mL (ref 0.30–0.70)

## 2018-07-22 LAB — POTASSIUM: POTASSIUM: 3.9 mmol/L (ref 3.5–5.1)

## 2018-07-22 MED ORDER — ENOXAPARIN SODIUM 60 MG/0.6ML ~~LOC~~ SOLN
1.0000 mg/kg | Freq: Once | SUBCUTANEOUS | Status: AC
  Administered 2018-07-22: 60 mg via SUBCUTANEOUS
  Filled 2018-07-22: qty 0.6

## 2018-07-22 MED ORDER — WARFARIN SODIUM 3 MG PO TABS
3.0000 mg | ORAL_TABLET | Freq: Once | ORAL | Status: DC
  Filled 2018-07-22: qty 1

## 2018-07-22 MED ORDER — POTASSIUM CHLORIDE CRYS ER 20 MEQ PO TBCR: 20.0000 meq | EXTENDED_RELEASE_TABLET | Freq: Two times a day (BID) | ORAL | 0 refills | Status: DC

## 2018-07-22 MED ORDER — LOSARTAN POTASSIUM 25 MG PO TABS: 25.0000 mg | ORAL_TABLET | Freq: Every day | ORAL | 0 refills | Status: DC

## 2018-07-22 MED ORDER — ENOXAPARIN SODIUM 60 MG/0.6ML ~~LOC~~ SOLN
1.0000 mg/kg | Freq: Two times a day (BID) | SUBCUTANEOUS | Status: DC
  Filled 2018-07-22: qty 0.6

## 2018-07-22 MED ORDER — AMIODARONE HCL 200 MG PO TABS: 200.0000 mg | ORAL_TABLET | Freq: Every day | ORAL | 0 refills | Status: DC

## 2018-07-22 MED ORDER — BISOPROLOL FUMARATE 10 MG PO TABS: 10.0000 mg | ORAL_TABLET | Freq: Every day | ORAL | 0 refills | Status: DC

## 2018-07-22 MED ORDER — WARFARIN SODIUM 3 MG PO TABS: 3.0000 mg | ORAL_TABLET | Freq: Once | ORAL | 0 refills | Status: DC

## 2018-07-22 MED ORDER — ENOXAPARIN SODIUM 60 MG/0.6ML ~~LOC~~ SOLN: 1.0000 mg/kg | Freq: Two times a day (BID) | SUBCUTANEOUS | 0 refills | Status: DC

## 2018-07-22 MED ORDER — FUROSEMIDE 40 MG PO TABS: 40.0000 mg | ORAL_TABLET | Freq: Two times a day (BID) | ORAL | 0 refills | Status: DC

## 2018-07-22 NOTE — Telephone Encounter (Signed)
Left message on pt's vm that I am putting her on my schedule for INR check on Monday, 11/18. Pt has an appt on that day w/ Darylene Price in the HF clinic @ 1:00, asked pt to come down to see me when she leaves that appt. Asked her to call back w/ any questions or concerns.

## 2018-07-22 NOTE — Progress Notes (Signed)
[name] for heparin bridge w/ warfarin dosing Indication: atrial fibrillation  Allergies  Allergen Reactions  . Ace Inhibitors Cough and Other (See Comments)  . Amoxicillin-Pot Clavulanate Swelling and Other (See Comments)    JOINTS, PAIN Joint pain   . Penicillins Swelling and Other (See Comments)    SWOLLEN JOINTS  Has patient had a PCN reaction causing immediate rash, facial/tongue/throat swelling, SOB or lightheadedness with hypotension: Yes Has patient had a PCN reaction causing severe rash involving mucus membranes or skin necrosis: No Has patient had a PCN reaction that required hospitalization No Has patient had a PCN reaction occurring within the last 10 years: No If all of the above answers are "NO", then may proceed with Cephalosporin use.  Swollen joints  . Nifedipine Other (See Comments)    UNSPECIFIED UNSPECIFIED   . Propranolol Other (See Comments)    UNSPECIFIED UNSPECIFIED   . Diazepam Other (See Comments)    Made her "hyper" Made her "hyper" Made her "hyper"   . Meperidine Other (See Comments)    Made her "hyper" Made her "hyper" Made her "hyper"   . Propoxyphene Other (See Comments)    Made her "hyper" Made her "hyper" Made her "hyper"     Patient Measurements: Height: 5\' 4"  (162.6 cm) Weight: 131 lb 4.8 oz (59.6 kg) IBW/kg (Calculated) : 54.7 Heparin Dosing Weight: 59.4 kg  Vital Signs: Temp: 97.9 F (36.6 C) (11/13 0451) Temp Source: Oral (11/13 0451) BP: 140/63 (11/13 0451) Pulse Rate: 59 (11/13 0451)  Labs: Recent Labs    07/20/18 0440 07/20/18 0929 07/20/18 1830 07/21/18 0235 07/22/18 0410  HGB  --   --   --  10.3* 9.9*  HCT  --   --   --  33.7* 32.3*  PLT  --   --   --  238 225  APTT  --  48*  --   --   --   LABPROT 22.2*  --   --  22.4* 21.9*  INR 1.97  --   --  2.00 1.94  HEPARINUNFRC  --   --  0.45 0.58 0.64  CREATININE 0.82  --   --  0.80  --   CKTOTAL 51  --   --   --   --      Estimated Creatinine Clearance: 47.6 mL/min (by C-G formula based on SCr of 0.8 mg/dL).   Medical History: Past Medical History:  Diagnosis Date  . Anxiety   . Arthritis    "hands" (11/10/2015)  . CAD s/p CABG x 1    a. 04/2015 LIM[place] to LAD  . Chronic combined systolic (congestive) and diastolic (congestive) heart failure ([place])    a. 01/2015 Echo: EF 50-55%, Gr2 DD (preop valve surgery); b. 08/2015 Echo: EF 30-35%, diff HK. Gr2 DD; c. 09/2016 Echo: EF 50-55%. Nl fxning TV ring. Mildly dil LA.  Marland Kitchen COPD (chronic obstructive pulmonary disease) ([place])   . Coronary artery disease   . GERD (gastroesophageal reflux disease)   . History of colon polyps   . History of mitral valve replacement    a. 04/2015 s/p 27 mm [facility] Mitral bovine bioprosthetic tissue valve  . Hyperlipidemia   . Hypertension   . Hypothyroidism   . 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 04/2015 Complete bilateral atrial lesion set using cryothermy and bipolar radiofrequency ablation with clipping of LA appendage  . Meniere disease   . Meniere's disease   .  On home oxygen therapy    "2L at night" (11/10/2015)  . PAF (paroxysmal atrial fibrillation) ([place])   . Pneumonia ~ 2010  . PONV (postoperative nausea and vomiting)    Pt felt like eardrums were gonna pop  . Post-surgical complete heart block, symptomatic    a. 04/2015 s/p MDT [id number] Advisa DR MRI DC PPM.  . Presence of permanent cardiac pacemaker   . Pulmonary hypertension ([place])   . Surgical wound, non healing - chest wall 11/10/2015  . Tricuspid Regurgitation s/p Repair    a. 04/2015 s/p 26 mm Edwards mc3 ring annuloplasty  . Wound infection 10/13/2015   Superficial sternal wound infection    Assessment: 83 year old female with h/o afib on warfarin PTA. Patient's home dose of warfarin is 3.75 mg Tu/W/Th/F/Sat/Sun and 5 mg on Monday. Patient started on amiodarone drip and plan for cardioversion today with continuation of PO amiodarone after cardioversion.  The patient's warfarin was reduced to 2 mg daily due to drug interaction with amiodarone. Today's INR subtherapeutic at 1.97. Pharmacy consulted for heparin drip prior to cardioversion.  Anticipate home dose of warfarin 3 mg daily except 2 mg on Tuesday and Thursday. This represents an approximate 30% decrease from the patient's PTA regimen to account for the Jamie drug interaction with amiodarone.  Gave heparin 2900 unit bolus and start heparin drip at 800 units/hr.11/11 1830 level returned at 0.45 (within goal).    Goal of Therapy:  Heparin level 0.3-0.7 units/ml  INR 2-3 Monitor platelets by anticoagulation protocol: Yes   Plan:  Will continue the heparin at current rate. Plan to continue heparin drip post-cardioversion until INR therapeutic. Will order a heparin level 11/12 @0230   11/12 0230 heparin level 0.58. Continue current regimen. Recheck heparin level and CBC with tomorrow AM labs.  11/13 AM heparin level 0.64. Continue current regimen. Recheck heparin level and CBC with tomorrow AM labs.  INR 1.97 subtherapeutic. Will gave warfarin 3 mg on 11/11 per Dr. Fletcher Anon. Will plan to transition patient to the above warfarin regimen starting tomorrow.  INR and CBC ordered with morning labs.  Eloise Harman, PharmD Clinical Pharmacist  07/22/2018 6:31 AM

## 2018-07-22 NOTE — Telephone Encounter (Signed)
TCM patient scheduled to see Thurmond Butts 08/03/18

## 2018-07-22 NOTE — Discharge Summary (Signed)
[facility] [place] NAME: [name]    MR#:  [id number]  DATE OF BIRTH:  Feb 05, 1935  DATE OF ADMISSION:  07/17/2018 ADMITTING PHYSICIAN: Loletha Grayer, MD  DATE OF DISCHARGE: 07/22/2018  1:54 PM  PRIMARY CARE PHYSICIAN: Ezequiel Kayser, MD    ADMISSION DIAGNOSIS:  Atrial fibrillation, unspecified type ([place]) [I48.91]  DISCHARGE DIAGNOSIS:  Active Problems:   Rapid atrial fibrillation ([place])   SECONDARY DIAGNOSIS:   Past Medical History:  Diagnosis Date  . Anxiety   . Arthritis    "hands" (11/10/2015)  . CAD s/p CABG x 1    a. 04/2015 LIMA to LAD  . Chronic combined systolic (congestive) and diastolic (congestive) heart failure ([place])    a. 01/2015 Echo: EF 50-55%, Gr2 DD (preop valve surgery); b. 08/2015 Echo: EF 30-35%, diff HK. Gr2 DD; c. 09/2016 Echo: EF 50-55%. Nl fxning TV ring. Mildly dil LA.  Marland Kitchen COPD (chronic obstructive pulmonary disease) ([place])   . Coronary artery disease   . GERD (gastroesophageal reflux disease)   . History of colon polyps   . History of mitral valve replacement    a. 04/2015 s/p 27 mm [facility] Mitral bovine bioprosthetic tissue valve  . Hyperlipidemia   . Hypertension   . Hypothyroidism   . 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 04/2015 Complete bilateral atrial lesion set using cryothermy and bipolar radiofrequency ablation with clipping of LA appendage  . Meniere disease   . Meniere's disease   . On home oxygen therapy    "2L at night" (11/10/2015)  . PAF (paroxysmal atrial fibrillation) ([place])   . Pneumonia ~ 2010  . PONV (postoperative nausea and vomiting)    Pt felt like eardrums were gonna pop  . Post-surgical complete heart block, symptomatic    a. 04/2015 s/p MDT [id number] Advisa DR MRI DC PPM.  . Presence of permanent cardiac pacemaker   . Pulmonary hypertension ([place])   . Surgical wound, non healing - chest wall 11/10/2015  . Tricuspid Regurgitation s/p Repair    a. 04/2015 s/p 26 mm Edwards  mc3 ring annuloplasty  . Wound infection 10/13/2015   Superficial sternal wound infection    HOSPITAL COURSE:   1.  Atrial fibrillation with rapid ventricular response.  The patient was placed on amiodarone drip.  Heart rate may remain difficult to control.  The patient was kept on her bisoprolol.  The patient was cardioverted on 07/20/2018 and remains now in normal sinus rhythm.  Her INR dipped down under 2 so cardiology was concerned and wanted me to bridge with Lovenox injections 60 mg twice a day until her INR comes up.  With her now being on oral amiodarone 200 mg daily her Coumadin dose will need to be checked weekly for right now.  I did give her enough Lovenox injections to last through Monday but hopefully she can come off the Lovenox prior to that if her INR is better on Friday. 2.  Acute on chronic diastolic congestive heart failure.  The patient was diuresed initially with Lasix 40 mg IV daily then we had a switch to 40 mg IV twice a day.  We continued bisoprolol and she is also on losartan.  I decreased the losartan dose secondary to hypotension.  Other cardiac meds contraindicated with relative hypotension.  Follow-up at the CHF clinic as outpatient.  Patient was prescribed Lasix 40 mg p.o. twice daily.  Continue bisoprolol and lower dose  losartan. 3.  COPD.  The patient was given DuoNeb nebulizer solution standing dose.  She wears her oxygen at night for sleep apnea. 4.  Sleep apnea oxygen at night 5.  History of CAD 6.  Hyperlipidemia unspecified.  The patient had lower extremity weakness and I held her Zocor and she states that this is better.  Continue statin holiday.  Can consider pravastatin if want to do a statin rechallenge in the future. 7.  GERD on Protonix 8.  Hypothyroidism unspecified on Synthroid 9.  Impaired fasting glucose.  Patient not a diabetic hemoglobin A1c 6.0. 10.  Deferred home health at this time  DISCHARGE CONDITIONS:   Satisfactory  CONSULTS OBTAINED:   Treatment Team:  Minna Merritts, MD Thompson Grayer, MD  DRUG ALLERGIES:   Allergies  Allergen Reactions  . Ace Inhibitors Cough and Other (See Comments)  . Amoxicillin-Pot Clavulanate Swelling and Other (See Comments)    JOINTS, PAIN Joint pain   . Penicillins Swelling and Other (See Comments)    SWOLLEN JOINTS  Has patient had a PCN reaction causing immediate rash, facial/tongue/throat swelling, SOB or lightheadedness with hypotension: Yes Has patient had a PCN reaction causing severe rash involving mucus membranes or skin necrosis: No Has patient had a PCN reaction that required hospitalization No Has patient had a PCN reaction occurring within the last 10 years: No If all of the above answers are "NO", then may proceed with Cephalosporin use.  Swollen joints  . Nifedipine Other (See Comments)    UNSPECIFIED UNSPECIFIED   . Propranolol Other (See Comments)    UNSPECIFIED UNSPECIFIED   . Diazepam Other (See Comments)    Made her "hyper" Made her "hyper" Made her "hyper"   . Meperidine Other (See Comments)    Made her "hyper" Made her "hyper" Made her "hyper"   . Propoxyphene Other (See Comments)    Made her "hyper" Made her "hyper" Made her "hyper"     DISCHARGE MEDICATIONS:   Allergies as of 07/22/2018      Reactions   Ace Inhibitors Cough, Other (See Comments)   Amoxicillin-pot Clavulanate Swelling, Other (See Comments)   JOINTS, PAIN Joint pain    Penicillins Swelling, Other (See Comments)   SWOLLEN JOINTS  Has patient had a PCN reaction causing immediate rash, facial/tongue/throat swelling, SOB or lightheadedness with hypotension: Yes Has patient had a PCN reaction causing severe rash involving mucus membranes or skin necrosis: No Has patient had a PCN reaction that required hospitalization No Has patient had a PCN reaction occurring within the last 10 years: No If all of the above answers are "NO", then may proceed with Cephalosporin use. Swollen  joints   Nifedipine Other (See Comments)   UNSPECIFIED UNSPECIFIED   Propranolol Other (See Comments)   UNSPECIFIED UNSPECIFIED   Diazepam Other (See Comments)   Made her "hyper" Made her "hyper" Made her "hyper"   Meperidine Other (See Comments)   Made her "hyper" Made her "hyper" Made her "hyper"   Propoxyphene Other (See Comments)   Made her "hyper" Made her "hyper" Made her "hyper"      Medication List    STOP taking these medications   bisoprolol-hydrochlorothiazide 10-6.25 MG tablet Commonly known as:  ZIAC   simvastatin 20 MG tablet Commonly known as:  ZOCOR     TAKE these medications   acetaminophen 325 MG tablet Commonly known as:  TYLENOL Take 2 tablets (650 mg total) by mouth every 4 (four) hours as needed for mild pain (  or Fever >/= 101).   AMBULATORY NON FORMULARY MEDICATION [facility] at Home Oxygen machine with tubing. DX: COPD DX Code: J44.9   amiodarone 200 MG tablet Commonly known as:  PACERONE Take 1 tablet (200 mg total) by mouth daily. Start taking on:  07/23/2018   aspirin 81 MG EC tablet Take 1 tablet (81 mg total) by mouth daily.   azelastine 0.1 % nasal spray Commonly known as:  ASTELIN Place 1 spray into both nostrils 2 (two) times daily as needed for rhinitis or allergies.   bisoprolol 10 MG tablet Commonly known as:  ZEBETA Take 1 tablet (10 mg total) by mouth daily. Start taking on:  07/23/2018   enoxaparin 60 MG/0.6ML injection Commonly known as:  LOVENOX Inject 0.6 mLs (60 mg total) into the skin every 12 (twelve) hours.   ferrous sulfate 325 (65 FE) MG tablet Take 325 mg by mouth daily with breakfast.   fluticasone 50 MCG/ACT nasal spray Commonly known as:  FLONASE Place 1 spray into both nostrils daily as needed for allergies.   furosemide 40 MG tablet Commonly known as:  LASIX Take 1 tablet (40 mg total) by mouth 2 (two) times daily. What changed:    medication strength  how much to take   gabapentin 100 MG  capsule Commonly known as:  NEURONTIN Take 200 mg by mouth at bedtime.   ipratropium-albuterol 0.5-2.5 (3) MG/3ML Soln Commonly known as:  DUONEB Take 3 mLs by nebulization every 6 (six) hours as needed. DX:J44.9 What changed:    reasons to take this  additional instructions   levothyroxine 88 MCG tablet Commonly known as:  SYNTHROID, LEVOTHROID Take 88 mcg by mouth daily before breakfast.   losartan 25 MG tablet Commonly known as:  COZAAR Take 1 tablet (25 mg total) by mouth daily. Start taking on:  07/23/2018 What changed:    medication strength  how much to take   pantoprazole 40 MG tablet Commonly known as:  PROTONIX Take 40 mg by mouth 2 (two) times daily.   potassium chloride SA 20 MEQ tablet Commonly known as:  K-DUR,KLOR-CON Take 1 tablet (20 mEq total) by mouth 2 (two) times daily. What changed:  when to take this   PRESERVISION AREDS 2 PO Take 2 tablets by mouth daily.   warfarin 3 MG tablet Commonly known as:  COUMADIN Take as directed. If you are unsure how to take this medication, talk to your nurse or doctor. Original instructions:  Take 1 tablet (3 mg total) by mouth one time only at 6 PM. What changed:    medication strength  See the new instructions.        DISCHARGE INSTRUCTIONS:   Follow-up PMD 5 days Follow-up cardiology clinic to draw INR hopefully on Friday Follow-up cardiology hopefully on Friday  If you experience worsening of your admission symptoms, develop shortness of breath, life threatening emergency, suicidal or homicidal thoughts you must seek medical attention immediately by calling 911 or calling your MD immediately  if symptoms less severe.  You Must read complete instructions/literature along with all the possible adverse reactions/side effects for all the Medicines you take and that have been prescribed to you. Take any new Medicines after you have completely understood and accept all the possible adverse reactions/side  effects.   Please note  You were cared for by a hospitalist during your hospital stay. If you have any questions about your discharge medications or the care you received while you were in the hospital after you  are discharged, you can call the unit and asked to speak with the hospitalist on call if the hospitalist that took care of you is not available. Once you are discharged, your primary care physician will handle any further medical issues. Please note that NO REFILLS for any discharge medications will be authorized once you are discharged, as it is imperative that you return to your primary care physician (or establish a relationship with a primary care physician if you do not have one) for your aftercare needs so that they can reassess your need for medications and monitor your lab values.    Today   CHIEF COMPLAINT:   Chief Complaint  Patient presents with  . Abnormal ECG    HISTORY OF PRESENT ILLNESS:  Lequisha Cammack  is a 83 y.o. female sent in for cardiology with rapid atrial fibrillation   VITAL SIGNS:  Blood pressure (!) 132/57, pulse (!) 59, temperature 98.4 F (36.9 C), resp. rate 20, height 5\' 4"  (1.626 m), weight 59.6 kg, SpO2 93 %.    PHYSICAL EXAMINATION:  GENERAL:  83 y.o.-year-old patient lying in the bed with no acute distress.  EYES: Pupils equal, round, reactive to light and accommodation. No scleral icterus. Extraocular muscles intact.  HEENT: Head atraumatic, normocephalic. Oropharynx and nasopharynx clear.  NECK:  Supple, no jugular venous distention. No thyroid enlargement, no tenderness.  LUNGS: Decreased breath sounds bilateral bases, no wheezing, rales,rhonchi or crepitation. No use of accessory muscles of respiration.  CARDIOVASCULAR: S1, S2 normal. No murmurs, rubs, or gallops.  ABDOMEN: Soft, non-tender, non-distended. Bowel sounds present. No organomegaly or mass.  EXTREMITIES: No pedal edema, cyanosis, or clubbing.  NEUROLOGIC: Cranial nerves II  through XII are intact. Muscle strength 5/5 in all extremities. Sensation intact. Gait not checked.  PSYCHIATRIC: The patient is alert and oriented x 3.  SKIN: No obvious rash, lesion, or ulcer.   DATA REVIEW:   CBC Recent Labs  Lab 07/22/18 0410  WBC 7.0  HGB 9.9*  HCT 32.3*  PLT 225    Chemistries  Recent Labs  Lab 07/21/18 0235 07/22/18 0410  NA 142  --   K 3.7 3.9  CL 104  --   CO2 31  --   GLUCOSE 120*  --   BUN 21  --   CREATININE 0.80  --   CALCIUM 8.8*  --     Cardiac Enzymes Recent Labs  Lab 07/17/18 1844  TROPONINI <0.03    RADIOLOGY:  Dg Chest 2 View  Result Date: 07/22/2018 CLINICAL DATA:  Shortness of breath EXAM: CHEST - 2 VIEW COMPARISON:  07/17/2018 FINDINGS: Prior median sternotomy and valve replacement. Left pacer remains in place, unchanged. Right mid lung calcified granuloma. Cardiomegaly. No edema or effusions. No acute bony abnormality. IMPRESSION: Cardiomegaly.  Old granulomatous disease.  No acute findings. Electronically Signed   By: Rolm Baptise M.D.   On: 07/22/2018 09:07    Management plans discussed with the patient, family and they are in agreement.  CODE STATUS:     Code Status Orders  (From admission, onward)         Start     Ordered   07/17/18 2000  Do not attempt resuscitation (DNR)  Continuous    Question Answer Comment  In the event of cardiac or respiratory ARREST Do not call a "code blue"   In the event of cardiac or respiratory ARREST Do not perform Intubation, CPR, defibrillation or ACLS   In the event of cardiac or  respiratory ARREST Use medication by any route, position, wound care, and other measures to relive pain and suffering. May use oxygen, suction and manual treatment of airway obstruction as needed for comfort.   Comments nurse may pronounce      07/17/18 2000        Code Status History    Date Active Date Inactive Code Status Order ID Comments User Context   06/29/2016 1240 07/01/2016 1707 Full  Code [id number]  Henreitta Leber, MD Inpatient   11/10/2015 1242 11/27/2015 1552 Full Code [id number]  John Giovanni, PA-C ED   05/02/2015 2202 05/11/2015 1907 Full Code [id number]  Thompson Grayer, MD Inpatient   04/25/2015 1629 05/02/2015 2202 Full Code [id number]  Rexene Alberts, MD Inpatient    Advance Directive Documentation     Most Recent Value  Type of Advance Directive  Healthcare Power of [place], Living will  Pre-existing out of facility DNR order (yellow form or pink MOST form)  -  "MOST" Form in Place?  -      TOTAL TIME TAKING CARE OF THIS PATIENT: 35 minutes.    Loletha Grayer M.D on 07/22/2018 at 4:24 PM  Between 7am to 6pm - Pager - [phone]  After 6pm go to [email or web address] - password [email or web address]  [facility] Office  [phone]  CC: Primary care physician; Ezequiel Kayser, MD

## 2018-07-22 NOTE — Progress Notes (Signed)
Physical Therapy Treatment Patient Details Name: [name] MRN: [id number] DOB: December 21, 1934 Today's Date: 07/22/2018    History of Present Illness 83 y.o. female with a known history of paroxysmal atrial fibrillation.  For the past 3 weeks has been weak and giving out with walking around.  Admitted with rapid Afib, to have cardioversion 07/20/18.    PT Comments    Pt in bed, ready to walk.  Bed mobility without assist.  She was able to stand and ambulate x 2 around unit with walker and min guard/supervision on room air.  She reports feeling somewhat unsteady but stated that is her baseline since her cochlear implant.  She has 2 walkers at home and was encouraged to use them and have her husband walk with her initially upon discharge for safety.  Both voiced understanding.  Upon return to sitting after 2 laps she reported SOB and requested to sit EOB for a while before laying down.  O2 sats checked and 84% on room air.  After 2 minutes she increased to low 90's.  HR stable.  She stated she only wears O2 at night and occasionally will get SOB at home with activity.  She reported having an pulse oximeter at home and was encouraged to monitor O2 level with activity and report to MD if they regularly fall below 88/89%.  Pt and husband both voiced understanding.  Relayed to RN and nurse tech so they can continue to monitor O2 with activity.  She stated this was the first time she walked 2 laps in one attempt.     Follow Up Recommendations  Other (comment) Cardiac rehab.     Equipment Recommendations  None recommended by PT    Recommendations for Other Services       Precautions / Restrictions Precautions Precautions: Fall Restrictions Weight Bearing Restrictions: No Other Position/Activity Restrictions: monitor O2 with gait - decreased to 84% on room air 11/13    Mobility  Bed Mobility Overal bed mobility: Independent                Transfers Overall transfer level:  Independent                  Ambulation/Gait Ambulation/Gait assistance: Supervision Gait Distance (Feet): 350 Feet Assistive device: Rolling walker (2 wheeled)   Gait velocity: decreased       Stairs             Wheelchair Mobility    Modified Rankin (Stroke Patients Only)       Balance Overall balance assessment: Modified Independent;Needs assistance Sitting-balance support: Feet supported Sitting balance-Leahy Scale: Good     Standing balance support: Bilateral upper extremity supported Standing balance-Leahy Scale: Fair                              Cognition Arousal/Alertness: Awake/alert Behavior During Therapy: WFL for tasks assessed/performed Overall Cognitive Status: Within Functional Limits for tasks assessed                                        Exercises      General Comments General comments (skin integrity, edema, etc.): Pt stated she feels generally unsteady but has no LOB with gait or transfers.  Encouraged to have +1 assist upon discharge.  Husband in and voices understanding.      Pertinent  Vitals/Pain Pain Assessment: No/denies pain    Home Living                      Prior Function            PT Goals (current goals can now be found in the care plan section) Progress towards PT goals: Progressing toward goals    Frequency    Min 2X/week      PT Plan Current plan remains appropriate    Co-evaluation              AM-PAC PT "6 Clicks" Daily Activity  Outcome Measure  Difficulty turning over in bed (including adjusting bedclothes, sheets and blankets)?: None Difficulty moving from lying on back to sitting on the side of the bed? : None Difficulty sitting down on and standing up from a chair with arms (e.g., wheelchair, bedside commode, etc,.)?: None Help needed moving to and from a bed to chair (including a wheelchair)?: None Help needed walking in hospital room?: A  Little Help needed climbing 3-5 steps with a railing? : A Little 6 Click Score: 22    End of Session Equipment Utilized During Treatment: Gait belt Activity Tolerance: Patient tolerated treatment well Patient left: in bed;with family/visitor present;with call bell/phone within reach Nurse Communication: Other (comment)       Time: 1610-9604 PT Time Calculation (min) (ACUTE ONLY): 12 min  Charges:  $Gait Training: 8-22 mins                     Chesley Noon, PTA 07/22/18, 11:02 AM

## 2018-07-22 NOTE — Discharge Instructions (Signed)
Need inr check weekly for now.  Hopefull can check on Friday.

## 2018-07-22 NOTE — Progress Notes (Signed)
Cardiovascular and Pulmonary Nurse Navigator Note:      83 y.o. female with a history of atrial fibrillation, mitralvalve stenosisand tricuspid regurgitation status post MV replacement/TVR/maze/left atrial appendage clipping, and CAD status post CABG x1, who presents for follow-up related to a 3-week history of progressive dyspnea and lower extremity swelling.She appears to have afib with RVR as the cause.  Patient underwent cardioversion on 07/20/2018.  Patient in AV paced rhythm after Cardioversion.  Patient with acute on chronic systolic heart failure and is currently on Lasix, Losartan, and Bisoprolol.     Rounded on the patient.  PT evaluated patient and recommended Cardiac Rehab for patient.    ------------------------------------------------------------------- Transthoracic Echocardiography  Patient:    [name], [name] MR #:       [id number] Study Date: 07/17/2018    ATTENDING    Default, Provider [phone]  ORDERING     Kathlyn Sacramento, MD  [place], MD  PERFORMING   [place], Arial  SONOGRAPHER  Pilar Jarvis, RVT, RDCS, RDMS  cc:  ------------------------------------------------------------------- LV EF: 20% -   25%  ------------------------------------------------------------------- Indications:      CHF (I50.9).  ------------------------------------------------------------------- History:   PMH:  TVR, MVR, CABG, MAZE.  Atrial fibrillation. Congestive heart failure.  Chronic obstructive pulmonary disease. Risk factors:  Progressive dyspnea and fatigue, especially over the last 2 weeks. Former tobacco use. Hypertension. Diabetes mellitus. Dyslipidemia.  ------------------------------------------------------------------- Study Conclusions  - Left ventricle: The cavity size was normal. There was mild   concentric hypertrophy. Systolic function was severely reduced.   The estimated ejection fraction was in the range of 20% to 25%.   Diffuse  hypokinesis. Regional wall motion abnormalities cannot be   excluded. The study is not technically sufficient to allow   evaluation of LV diastolic function. - Mitral valve: Prior procedures included surgical repair. A was   present and functioning normally. The sewing ring appeared   normal. Mean gradient (D): 5 mm Hg. Valve area by continuity   equation (using LVOT flow): 0.99 cm^2. - Left atrium: The atrium was mildly dilated. - Right ventricle: The cavity size was moderately dilated. Wall   thickness was normal. Systolic function was moderately reduced. - Pulmonary arteries: Systolic pressure could not be accurately   estimated.  This RN informed patient PT recommended Cardiac Rehab.  Patient stated she previously participated in Cardiac Rehab in 2016 after she had valve replacement surgery.  Her husband spoke up and stated she was on oxygen and using a walker the last time she participated in Cardiac Rehab.    Explained to patient per Medicare Guidelines with CHF and EF of 20-25% she would qualify for Cardiac Rehab.  Medicare requirements state that CHF patients must have an EF of 35% or less and be out of the hospital and controlled on CHF medications for 6 weeks prior to starting Cardiac Rehab.  Patient and Husband are interested in patient participating.  Patient's husband would like to exercise in the Independent Gym using his Silver Sneakers  Benefit while patient is participating in Cardiac Rehab.  This RN  placed a referral for Cardiac Rehab per standing order protocol.    Dr. Fletcher Anon needs to co-sign order.  Brochure, introductory letter, CPT Billing Codes, Orientation and Class Times were provided to patient.    Roanna Epley, RN, BSN, [facility]  Cardiovascular & Pulmonary Nurse Navigator  Direct Line: [phone]  Department Phone #: [phone] Fax: [phone]  Email Address: [email or web address]

## 2018-07-22 NOTE — Progress Notes (Signed)
Patient given discharge instructions with husband at bedside. IV's taken out and tele monitor off. Prescription given. Patient and husband verbalized understanding with no further questions or concerns. Patient going home via family vehicle.

## 2018-07-22 NOTE — Plan of Care (Signed)

## 2018-07-22 NOTE — Telephone Encounter (Signed)
-----   Message from Nelva Bush, MD sent at 07/22/2018 10:58 AM EST ----- Regarding: Post-hospital f/u Hello,  Pt is being d/c'ed today after hospitalization for HF and a-fib s/p cardioversion.  She is on warfarin and being bridged with Lovenox.  Can we try to have her be seen in the Coumadin clinic on Friday or Monday and also see Dr. Fletcher Anon or an APP in 1-2 weeks for [facility]?  Thanks.  Gerald Stabs

## 2018-07-22 NOTE — Plan of Care (Signed)

## 2018-07-22 NOTE — Progress Notes (Signed)
Progress Note  Patient Name: [name] Date of Encounter: 07/22/2018  Primary Cardiologist: Kathlyn Sacramento, MD   Subjective   Patient feels well with less shortness of breath.  No chest pain, palpitations, or edema.  Inpatient Medications    Scheduled Meds: . amiodarone  200 mg Oral Daily  . aspirin EC  81 mg Oral Daily  . bisoprolol  10 mg Oral Daily  . ferrous sulfate  325 mg Oral Q breakfast  . furosemide  40 mg Intravenous BID  . gabapentin  200 mg Oral QHS  . levothyroxine  88 mcg Oral Q0600  . losartan  25 mg Oral Daily  . multivitamin-lutein  2 capsule Oral Daily  . pantoprazole  40 mg Oral BID  . polyethylene glycol  17 g Oral Daily  . potassium chloride SA  40 mEq Oral Daily  . warfarin  3 mg Oral ONCE-1800  . Warfarin - Pharmacist Dosing Inpatient   Does not apply q1800   Continuous Infusions: . heparin 800 Units/hr (07/21/18 1930)   PRN Meds: acetaminophen **OR** acetaminophen, azelastine, bisacodyl, fluticasone, ipratropium-albuterol, ondansetron **OR** ondansetron (ZOFRAN) IV, senna-docusate   Vital Signs    Vitals:   07/21/18 1530 07/21/18 2033 07/22/18 0451 07/22/18 0753  BP: 118/61 103/82 140/63 (!) 132/57  Pulse: (!) 59 (!) 59 (!) 59 (!) 59  Resp: 19   20  Temp: 97.7 F (36.5 C) 98.2 F (36.8 C) 97.9 F (36.6 C) 98.4 F (36.9 C)  TempSrc: Oral Oral Oral   SpO2: 92% 93% 96% 90%  Weight:   59.6 kg   Height:        Intake/Output Summary (Last 24 hours) at 07/22/2018 1123 Last data filed at 07/22/2018 1044 Gross per 24 hour  Intake 538.54 ml  Output 2050 ml  Net -1511.46 ml   Filed Weights   07/17/18 2105 07/21/18 0446 07/22/18 0451  Weight: 59.4 kg 59.4 kg 59.6 kg    Telemetry    AV-paced - Personally Reviewed  ECG    AV paced - Personally Reviewed  Physical Exam   GEN: No acute distress.   Neck: No JVD Cardiac: RRR, no murmurs, rubs, or gallops.  Respiratory: Clear to auscultation bilaterally. GI: Soft, nontender,  non-distended  MS: No edema; No deformity. Neuro:  Nonfocal  Psych: Normal affect   Labs    Chemistry Recent Labs  Lab 07/18/18 0510 07/20/18 0440 07/21/18 0235 07/22/18 0410  NA 143 142 142  --   K 3.9 3.2* 3.7 3.9  CL 102 99 104  --   CO2 32 35* 31  --   GLUCOSE 141* 114* 120*  --   BUN 20 21 21   --   CREATININE 0.88 0.82 0.80  --   CALCIUM 8.9 8.9 8.8*  --   GFRNONAA 60* >60 >60  --   GFRAA >60 >60 >60  --   ANIONGAP 9 8 7   --      Hematology Recent Labs  Lab 07/18/18 0510 07/21/18 0235 07/22/18 0410  WBC 9.7 7.6 7.0  RBC 3.38* 3.46* 3.31*  HGB 10.1* 10.3* 9.9*  HCT 33.5* 33.7* 32.3*  MCV 99.1 97.4 97.6  MCH 29.9 29.8 29.9  MCHC 30.1 30.6 30.7  RDW 14.5 14.7 14.8  PLT 227 238 225    Cardiac Enzymes Recent Labs  Lab 07/17/18 1844  TROPONINI <0.03   No results for input(s): TROPIPOC in the last 168 hours.   BNP Recent Labs  Lab 07/17/18 1838  BNP 477.0*     DDimer No results for input(s): DDIMER in the last 168 hours.   Radiology    Dg Chest 2 View  Result Date: 07/22/2018 CLINICAL DATA:  Shortness of breath EXAM: CHEST - 2 VIEW COMPARISON:  2018-08-15 FINDINGS: Prior median sternotomy and valve replacement. Left pacer remains in place, unchanged. Right mid lung calcified granuloma. Cardiomegaly. No edema or effusions. No acute bony abnormality. IMPRESSION: Cardiomegaly.  Old granulomatous disease.  No acute findings. Electronically Signed   By: Rolm Baptise M.D.   On: 07/22/2018 09:07    Cardiac Studies   Echocardiogram done on 08/15/2018:  Study Conclusions  - Left ventricle: The cavity size was normal. There was mild concentric hypertrophy. Systolic function was severely reduced. The estimated ejection fraction was in the range of 20% to 25%. Diffuse hypokinesis. Regional wall motion abnormalities cannot be excluded. The study is not technically sufficient to allow evaluation of LV diastolic function. - Mitral  valve: Prior procedures included surgical repair. A was present and functioning normally. The sewing ring appeared normal. Mean gradient (D): 5 mm Hg. Valve area by continuity equation (using LVOT flow): 0.99 cm^2. - Left atrium: The atrium was mildly dilated. - Right ventricle: The cavity size was moderately dilated. Wall thickness was normal. Systolic function was moderately reduced. - Pulmonary arteries: Systolic pressure could not be accurately estimated.  Patient Profile     83 y.o. female with a history of atrial fibrillation, mitralvalve stenosisand tricuspid regurgitation status post MV replacement/TVR/maze/left atrial appendage clipping, and CAD status post CABG x1, who presents for follow-up related to a 3-week history of progressive dyspnea and lower extremity swelling.She appears to have afib with RVR as the cause.  Assessment & Plan    Persistent a-fib Patient maintaining AV paced rhythm.  Feeling better.  INR subtherapeutic today.  Patient eager to go home today; recommend Lovenox bridge until INR therapeutic (2-3).  Will arrange for anticoagulation f/u on Friday or Monday in our office.  Continue amiodarone 200 mg PO daily  Acute on chronic systolic heart failure Patient continues to improve from a symptoms standpoint.  She appears euvolemic today.  Continue furosemide 40 mg PO BID, as well as losartan and bisoprolol.  Will need BMP when she follows up as an outpatient in 1-2 weeks.  CAD No symptoms.  Continue secondary prevention.  Valvular heart disease Symptoms improving.  Continue anticoagulation and HF therapy, as above.  [facility] will sign off.   Medication Recommendations:  Lovenox bridge per pharmacy until INR 2-3 Other recommendations (labs, testing, etc):  BMP in 1-2 weeks. Follow up as an outpatient:  INR check/anticoagulation clinic on Friday or Monday.  F/u with Dr. Fletcher Anon or APP in 1-2 weeks.  For questions or updates,  please contact [facility] Please consult [email or web address] for contact info under [facility].    Signed, Nelva Bush, MD  07/22/2018, 11:23 AM

## 2018-07-23 ENCOUNTER — Telehealth: Payer: Self-pay | Admitting: Cardiovascular Disease

## 2018-07-23 NOTE — Anesthesia Postprocedure Evaluation (Signed)
Anesthesia Post Note  Patient: [name]  Procedure(s) Performed: CARDIOVERSION (N/A )  Patient location during evaluation: PACU Anesthesia Type: General Level of consciousness: awake and alert Pain management: pain level controlled Vital Signs Assessment: post-procedure vital signs reviewed and stable Respiratory status: spontaneous breathing, nonlabored ventilation, respiratory function stable and patient connected to nasal cannula oxygen Cardiovascular status: blood pressure returned to baseline and stable Postop Assessment: no apparent nausea or vomiting Anesthetic complications: no     Last Vitals:  Vitals:   07/22/18 0753 07/22/18 1300  BP: (!) 132/57   Pulse: (!) 59   Resp: 20   Temp: 36.9 C   SpO2: 90% 93%    Last Pain:  Vitals:   07/22/18 0716  TempSrc:   PainSc: 0-No pain                 Molli Barrows

## 2018-07-23 NOTE — Telephone Encounter (Signed)
Please call patient to discuss Bisoprolol, pt is not sure she should continue to take this.

## 2018-07-23 NOTE — Telephone Encounter (Signed)
See 07/22/18- phone note- discussed bisoprolol with TCM follow up call.

## 2018-07-23 NOTE — Telephone Encounter (Signed)
Patient contacted regarding discharge from [facility] on 07/22/18.  Patient understands to follow up with provider Christell Faith, PA on 08/03/18 at 2:00 pm at the [facility] office. Patient understands discharge instructions? Yes Patient understands medications and regiment? Yes Patient understands to bring all medications to this visit? Yes  The patient states the pharmacy did not have her plain bisoprolol 10 mg tablet yesterday at the pharmacy. She reports the pharmacy told her they should have this later today. The patient inquired if she could take her bisoprolol/hctz in the interim. I have advised her to check her BP/ HR and if her HR is between 60-100 bpm, she could wait to take the plain bisoprolol 10 mg later this afternoon. She has taken amiodarone today.  The patient voices understanding of the above and is agreeable.

## 2018-07-27 ENCOUNTER — Encounter: Payer: Self-pay | Admitting: Family

## 2018-07-27 ENCOUNTER — Telehealth: Payer: Self-pay

## 2018-07-27 ENCOUNTER — Ambulatory Visit: Payer: Medicare Other | Attending: Family | Admitting: Family

## 2018-07-27 ENCOUNTER — Ambulatory Visit (INDEPENDENT_AMBULATORY_CARE_PROVIDER_SITE_OTHER): Payer: Medicare Other

## 2018-07-27 VITALS — BP 110/46 | HR 63 | Resp 18 | Ht 64.0 in | Wt 128.4 lb

## 2018-07-27 DIAGNOSIS — I2510 Atherosclerotic heart disease of native coronary artery without angina pectoris: Secondary | ICD-10-CM | POA: Insufficient documentation

## 2018-07-27 DIAGNOSIS — Z7901 Long term (current) use of anticoagulants: Secondary | ICD-10-CM | POA: Insufficient documentation

## 2018-07-27 DIAGNOSIS — Z87891 Personal history of nicotine dependence: Secondary | ICD-10-CM | POA: Insufficient documentation

## 2018-07-27 DIAGNOSIS — I272 Pulmonary hypertension, unspecified: Secondary | ICD-10-CM | POA: Insufficient documentation

## 2018-07-27 DIAGNOSIS — I5042 Chronic combined systolic (congestive) and diastolic (congestive) heart failure: Secondary | ICD-10-CM | POA: Insufficient documentation

## 2018-07-27 DIAGNOSIS — J449 Chronic obstructive pulmonary disease, unspecified: Secondary | ICD-10-CM | POA: Insufficient documentation

## 2018-07-27 DIAGNOSIS — F419 Anxiety disorder, unspecified: Secondary | ICD-10-CM | POA: Diagnosis not present

## 2018-07-27 DIAGNOSIS — Z5181 Encounter for therapeutic drug level monitoring: Secondary | ICD-10-CM

## 2018-07-27 DIAGNOSIS — I110 Hypertensive heart disease with heart failure: Secondary | ICD-10-CM | POA: Insufficient documentation

## 2018-07-27 DIAGNOSIS — Z953 Presence of xenogenic heart valve: Secondary | ICD-10-CM | POA: Diagnosis not present

## 2018-07-27 DIAGNOSIS — Z950 Presence of cardiac pacemaker: Secondary | ICD-10-CM | POA: Diagnosis not present

## 2018-07-27 DIAGNOSIS — E785 Hyperlipidemia, unspecified: Secondary | ICD-10-CM | POA: Insufficient documentation

## 2018-07-27 DIAGNOSIS — H8109 Meniere's disease, unspecified ear: Secondary | ICD-10-CM | POA: Diagnosis not present

## 2018-07-27 DIAGNOSIS — I340 Nonrheumatic mitral (valve) insufficiency: Secondary | ICD-10-CM

## 2018-07-27 DIAGNOSIS — Z951 Presence of aortocoronary bypass graft: Secondary | ICD-10-CM | POA: Insufficient documentation

## 2018-07-27 DIAGNOSIS — I480 Paroxysmal atrial fibrillation: Secondary | ICD-10-CM | POA: Insufficient documentation

## 2018-07-27 DIAGNOSIS — Z8249 Family history of ischemic heart disease and other diseases of the circulatory system: Secondary | ICD-10-CM | POA: Diagnosis not present

## 2018-07-27 DIAGNOSIS — Z79890 Hormone replacement therapy (postmenopausal): Secondary | ICD-10-CM | POA: Diagnosis not present

## 2018-07-27 DIAGNOSIS — Z9981 Dependence on supplemental oxygen: Secondary | ICD-10-CM | POA: Insufficient documentation

## 2018-07-27 DIAGNOSIS — I5022 Chronic systolic (congestive) heart failure: Secondary | ICD-10-CM

## 2018-07-27 DIAGNOSIS — Z79899 Other long term (current) drug therapy: Secondary | ICD-10-CM | POA: Diagnosis not present

## 2018-07-27 DIAGNOSIS — Z952 Presence of prosthetic heart valve: Secondary | ICD-10-CM | POA: Insufficient documentation

## 2018-07-27 DIAGNOSIS — Z7951 Long term (current) use of inhaled steroids: Secondary | ICD-10-CM | POA: Diagnosis not present

## 2018-07-27 DIAGNOSIS — E039 Hypothyroidism, unspecified: Secondary | ICD-10-CM | POA: Insufficient documentation

## 2018-07-27 DIAGNOSIS — Z7982 Long term (current) use of aspirin: Secondary | ICD-10-CM | POA: Diagnosis not present

## 2018-07-27 DIAGNOSIS — K219 Gastro-esophageal reflux disease without esophagitis: Secondary | ICD-10-CM | POA: Diagnosis not present

## 2018-07-27 DIAGNOSIS — I10 Essential (primary) hypertension: Secondary | ICD-10-CM

## 2018-07-27 DIAGNOSIS — I509 Heart failure, unspecified: Secondary | ICD-10-CM | POA: Diagnosis present

## 2018-07-27 LAB — POCT INR: INR: 2.7 (ref 2.0–3.0)

## 2018-07-27 NOTE — Telephone Encounter (Signed)
Flagged on EMMI report for having unfilled prescriptions.  First attempt to reach patient made, however unable to reach patient.  Left voicemail encouraging callback. Will attempt at later time.    

## 2018-07-27 NOTE — Progress Notes (Signed)
Patient ID: [name], female    DOB: 07-23-1935, 83 y.o.   MRN: [id number]  HPI  [name] is a 83 y/o female with a history of CAD, hyperlipidemia, HTN, thyroid disease, atrial fibrillation, COPD, pulmonary HTN, GERD, anxiety, meniere's disease, former tobacco use and chronic heart failure.   Echo report from 07/17/18 reviewed and showed an EF of 20-25%.  Cardiac catheterization done 10/28/14.   Admitted 07/17/18 due to rapid atrial fibrillation. Initially needed amiodarone drip and was cardioverted 07/20/18. Initially needed IV lasix due to acute on chronic HF and then transitioned to oral medications. Discharged after 5 days.   She presents today for her initial visit with a chief complaint of minimal fatigue upon moderate exertion. She describes this as chronic in nature having been present for a couple of years but is improving. She has associated light-headedness and easy bruising along with this. She denies any difficulty sleeping, abdominal distention, palpitations, pedal edema, chest pain, shortness of breath, cough or weight gain.   Past Medical History:  Diagnosis Date  . Anxiety   . Arthritis    "hands" (11/10/2015)  . CAD s/p CABG x 1    a. 04/2015 LIMA to LAD  . Chronic combined systolic (congestive) and diastolic (congestive) heart failure ([place])    a. 01/2015 Echo: EF 50-55%, Gr2 DD (preop valve surgery); b. 08/2015 Echo: EF 30-35%, diff HK. Gr2 DD; c. 09/2016 Echo: EF 50-55%. Nl fxning TV ring. Mildly dil LA.  Marland Kitchen COPD (chronic obstructive pulmonary disease) ([place])   . Coronary artery disease   . GERD (gastroesophageal reflux disease)   . History of colon polyps   . History of mitral valve replacement    a. 04/2015 s/p 27 mm [facility] Mitral bovine bioprosthetic tissue valve  . Hyperlipidemia   . Hypertension   . Hypothyroidism   . 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 04/2015 Complete bilateral atrial lesion set using cryothermy and bipolar radiofrequency ablation  with clipping of LA appendage  . Meniere disease   . Meniere's disease   . On home oxygen therapy    "2L at night" (11/10/2015)  . PAF (paroxysmal atrial fibrillation) ([place])   . Pneumonia ~ 2010  . PONV (postoperative nausea and vomiting)    Pt felt like eardrums were gonna pop  . Post-surgical complete heart block, symptomatic    a. 04/2015 s/p MDT [id number] [place]visa DR MRI DC PPM.  . Presence of permanent cardiac pacemaker   . Pulmonary hypertension ([place])   . Surgical wound, non healing - chest wall 11/10/2015  . Tricuspid Regurgitation s/p Repair    a. 04/2015 s/p 26 mm Edwards mc3 ring annuloplasty  . Wound infection 10/13/2015   Superficial sternal wound infection   Past Surgical History:  Procedure Laterality Date  . APPENDECTOMY    . APPLICATION OF A-CELL OF CHEST/ABDOMEN N/A 11/21/2015   Procedure: APPLICATION OF A-CELL OF CHEST/ABDOMEN;  Surgeon: Loel Lofty Dillingham, DO;  Location: [place];  Service: Plastics;  Laterality: N/A;  . APPLICATION OF A-CELL OF CHEST/ABDOMEN Right 12/21/2015   Procedure: APPLICATION OF A-CELL OF RIGHT CHEST;  Surgeon: Loel Lofty Dillingham, DO;  Location: [place];  Service: Plastics;  Laterality: Right;  . APPLICATION OF A-CELL OF CHEST/ABDOMEN Right 01/11/2016   Procedure: APPLICATION OF A-CELL OF RIGHT CHEST;  Surgeon: Loel Lofty Dillingham, DO;  Location: [place];  Service: Plastics;  Laterality: Right;  . APPLICATION OF A-CELL OF CHEST/ABDOMEN Right 02/12/2016  Procedure: APPLICATION OF A-CELL TO RIGHT CHEST WOUND;  Surgeon: Loel Lofty Dillingham, DO;  Location: [place];  Service: Plastics;  Laterality: Right;  . APPLICATION OF WOUND VAC N/A 05/09/2015   Procedure: APPLICATION OF WOUND VAC;  Surgeon: Rexene Alberts, MD;  Location: [place];  Service: Thoracic;  Laterality: N/A;  . APPLICATION OF WOUND VAC N/A 11/13/2015   Procedure: APPLICATION OF WOUND VAC;  Surgeon: Rexene Alberts, MD;  Location: [place];  Service: Thoracic;  Laterality: N/A;  . APPLICATION OF WOUND VAC N/A  11/21/2015   Procedure: APPLICATION OF WOUND VAC;  Surgeon: Loel Lofty Dillingham, DO;  Location: [place];  Service: Plastics;  Laterality: N/A;  . APPLICATION OF WOUND VAC Right 12/21/2015   Procedure: APPLICATION OF WOUND VAC to right chest wall ;  Surgeon: Loel Lofty Dillingham, DO;  Location: [place];  Service: Plastics;  Laterality: Right;  . APPLICATION OF WOUND VAC Right 01/11/2016   Procedure: APPLICATION OF WOUND VAC RIGHT CHEST ;  Surgeon: Loel Lofty Dillingham, DO;  Location: [place];  Service: Plastics;  Laterality: Right;  . APPLICATION OF WOUND VAC Right 01/24/2016   Procedure: APPLICATION OF WOUND VAC RIGHT CHEST WALL;  Surgeon: Loel Lofty Dillingham, DO;  Location: [place];  Service: Plastics;  Laterality: Right;  . APPLICATION OF WOUND VAC Right 02/12/2016   Procedure: RE-APPLICATION OF WOUND VAC TO RIGHT CHEST WOUND;  Surgeon: Loel Lofty Dillingham, DO;  Location: [place];  Service: Plastics;  Laterality: Right;  . BREAST BIOPSY Left 11/25/06   neg  . BREAST BIOPSY Left 01/20/12   /clip-neg  . CARDIAC CATHETERIZATION  11/2013   [facility]  . CARDIAC CATHETERIZATION  10/2014   [facility]  . CARDIAC VALVE REPLACEMENT    . CARDIOVERSION N/A 07/20/2018   Procedure: CARDIOVERSION;  Surgeon: Wellington Hampshire, MD;  Location: [facility];  Service: Cardiovascular;  Laterality: N/A;  . CATARACT EXTRACTION W/ INTRAOCULAR LENS  IMPLANT, BILATERAL Bilateral 2014  . CLIPPING OF ATRIAL APPENDAGE N/A 04/25/2015   Procedure: CLIPPING OF ATRIAL APPENDAGE;  Surgeon: Rexene Alberts, MD;  Location: [place];  Service: Open Heart Surgery;  Laterality: N/A;  . COCHLEAR IMPLANT Left 2005?  . COLONOSCOPY WITH PROPOFOL N/A 01/20/2018   Procedure: COLONOSCOPY WITH PROPOFOL;  Surgeon: Toledo, Benay Pike, MD;  Location: [facility];  Service: Gastroenterology;  Laterality: N/A;  . CORONARY ANGIOPLASTY    . CORONARY ARTERY BYPASS GRAFT N/A 04/25/2015   Procedure: CORONARY ARTERY BYPASS GRAFTING (CABG) x ONE, using left internal mammary artery;   Surgeon: Rexene Alberts, MD;  Location: [place];  Service: Open Heart Surgery;  Laterality: N/A;  . DILATION AND CURETTAGE OF UTERUS  "several before hysterectomy"  . EP IMPLANTABLE DEVICE N/A 05/02/2015   Procedure: Pacemaker Implant;  Surgeon: Thompson Grayer, MD;  Location: [place] [facility];  Service: Cardiovascular;  Laterality: N/A;  . ESOPHAGOGASTRODUODENOSCOPY (EGD) WITH PROPOFOL N/A 01/20/2018   Procedure: ESOPHAGOGASTRODUODENOSCOPY (EGD) WITH PROPOFOL;  Surgeon: Toledo, Benay Pike, MD;  Location: [facility];  Service: Gastroenterology;  Laterality: N/A;  . EYE SURGERY    . I&D EXTREMITY Right 12/06/2015   Procedure: IRRIGATION AND DEBRIDEMENT RIGHT CHEST WALL WITH ACELL PLACEMENT AND VAC;  Surgeon: Loel Lofty Dillingham, DO;  Location: [place];  Service: Plastics;  Laterality: Right;  . INCISION AND DRAINAGE OF WOUND N/A 11/21/2015   Procedure: IRRIGATION AND DEBRIDEMENT WOUND;  Surgeon: Loel Lofty Dillingham, DO;  Location: [place];  Service: Plastics;  Laterality: N/A;  .  INCISION AND DRAINAGE OF WOUND Right 12/21/2015   Procedure: IRRIGATION AND DEBRIDEMENT right chest wall WOUND;  Surgeon: Loel Lofty Dillingham, DO;  Location: [place];  Service: Plastics;  Laterality: Right;  right chest wall   . INCISION AND DRAINAGE OF WOUND Right 01/24/2016   Procedure: IRRIGATION AND DEBRIDEMENT RIGHT CHEST WALL WOUND;  Surgeon: Loel Lofty Dillingham, DO;  Location: [place];  Service: Plastics;  Laterality: Right;  . INCISION AND DRAINAGE OF WOUND Right 03/28/2016   Procedure: IRRIGATION AND DEBRIDEMENT RIGHT CHEST WALL WOUND WITH A Cell Placement;  Surgeon: Loel Lofty Dillingham, DO;  Location: [place];  Service: Plastics;  Laterality: Right;  . INCISION AND DRAINAGE OF WOUND Right 05/17/2016   Procedure: IRRIGATION AND DEBRIDEMENT OF RIGHT CHEST WOUND WITH A CELL PLACEMENT;  Surgeon: Loel Lofty Dillingham, DO;  Location: [place];  Service: Plastics;  Laterality: Right;  . INSERT / REPLACE / REMOVE PACEMAKER    . IRRIGATION  AND DEBRIDEMENT OF WOUND WITH SPLIT THICKNESS SKIN GRAFT Right 01/11/2016   Procedure: IRRIGATION AND DEBRIDEMENT OF RIGHT CHEST WOUND ;  Surgeon: Loel Lofty Dillingham, DO;  Location: [place];  Service: Plastics;  Laterality: Right;  . MASTECTOMY, PARTIAL Right 2002   positive  . MAZE N/A 04/25/2015   Procedure: MAZE;  Surgeon: Rexene Alberts, MD;  Location: [place];  Service: Open Heart Surgery;  Laterality: N/A;  . MITRAL VALVE REPAIR N/A 04/25/2015   Procedure: MITRAL VALVE  REPLACEMENT using a 27 mm Edwards Perimount Magna Mitral Ease Valve;  Surgeon: Rexene Alberts, MD;  Location: [place];  Service: Open Heart Surgery;  Laterality: N/A;  . SKIN SPLIT GRAFT Right 02/12/2016   Procedure: IRRIGATION AND DEBRIDEMENT RIGHT CHEST WOUND;  Surgeon: Loel Lofty Dillingham, DO;  Location: [place];  Service: Plastics;  Laterality: Right;  . STERNAL WIRES REMOVAL N/A 06/05/2015   Procedure: STERNAL WIRES REMOVAL;  Surgeon: Rexene Alberts, MD;  Location: [place];  Service: Thoracic;  Laterality: N/A;  . STERNAL WOUND DEBRIDEMENT N/A 05/09/2015   Procedure: STERNAL WOUND DEBRIDEMENT;  Surgeon: Rexene Alberts, MD;  Location: [place];  Service: Thoracic;  Laterality: N/A;  . STERNAL WOUND DEBRIDEMENT N/A 11/13/2015   Procedure: Excisional drainage of RIGHT Chest wall mass and breast mass ;  Surgeon: Rexene Alberts, MD;  Location: [place];  Service: Thoracic;  Laterality: N/A;  . TEE WITHOUT CARDIOVERSION N/A 04/25/2015   Procedure: TRANSESOPHAGEAL ECHOCARDIOGRAM (TEE);  Surgeon: Rexene Alberts, MD;  Location: [place];  Service: Open Heart Surgery;  Laterality: N/A;  . TONSILLECTOMY    . TRAM Right 11/18/2015   Procedure: VRAM Vertical Rectus Abdominus Muscle Flap;  Surgeon: Loel Lofty Dillingham, DO;  Location: [place];  Service: Plastics;  Laterality: Right;  RIght Back  . TRICUSPID VALVE REPLACEMENT N/A 04/25/2015   Procedure: TRICUSPID VALVE REPAIR;  Surgeon: Rexene Alberts, MD;  Location: [name];  Service: Open Heart Surgery;   Laterality: N/A;  . VAGINAL HYSTERECTOMY     Family History  Problem Relation Age of Onset  . Heart disease Father   . Heart disease Brother   . CVA Mother   . Heart attack Paternal Uncle   . Breast cancer Maternal Aunt   . Breast cancer Paternal Aunt    Social History   Tobacco Use  . Smoking status: Former Smoker    Packs/day: 1.50    Years: 40.00    Pack years: 60.00    Types: Cigarettes  Last attempt to quit: 04/20/1998    Years since quitting: 20.2  . Smokeless tobacco: Never Used  Substance Use Topics  . Alcohol use: Yes    Comment: 11/10/2015 "glass of wine a few times/year, if that"   Allergies  Allergen Reactions  . Ace Inhibitors Cough and Other (See Comments)  . Amoxicillin-Pot Clavulanate Swelling and Other (See Comments)    JOINTS, PAIN Joint pain   . Penicillins Swelling and Other (See Comments)    SWOLLEN JOINTS  Has patient had a PCN reaction causing immediate rash, facial/tongue/throat swelling, SOB or lightheadedness with hypotension: Yes Has patient had a PCN reaction causing severe rash involving mucus membranes or skin necrosis: No Has patient had a PCN reaction that required hospitalization No Has patient had a PCN reaction occurring within the last 10 years: No If all of the above answers are "NO", then may proceed with Cephalosporin use.  Swollen joints  . Nifedipine Other (See Comments)    UNSPECIFIED UNSPECIFIED   . Propranolol Other (See Comments)    UNSPECIFIED UNSPECIFIED   . Diazepam Other (See Comments)    Made her "hyper" Made her "hyper" Made her "hyper"   . Meperidine Other (See Comments)    Made her "hyper" Made her "hyper" Made her "hyper"   . Propoxyphene Other (See Comments)    Made her "hyper" Made her "hyper" Made her "hyper"    Prior to Admission medications   Medication Sig Start Date End Date Taking? Authorizing Provider  acetaminophen (TYLENOL) 325 MG tablet Take 2 tablets (650 mg total) by mouth every 4  (four) hours as needed for mild pain (or Fever >/= 101). 05/11/15  Yes Barrett, Erin R, PA-C  AMBULATORY NON FORMULARY MEDICATION [facility] at Home Oxygen machine with tubing. DX: COPD DX Code: J44.9 01/15/17  Yes Wilhelmina Mcardle, MD  amiodarone (PACERONE) 200 MG tablet Take 1 tablet (200 mg total) by mouth daily. 07/23/18  Yes Loletha Grayer, MD  aspirin EC 81 MG EC tablet Take 1 tablet (81 mg total) by mouth daily. 05/11/15  Yes Barrett, Erin R, PA-C  azelastine (ASTELIN) 0.1 % nasal spray Place 1 spray into both nostrils 2 (two) times daily as needed for rhinitis or allergies.    Yes [provider]  bisoprolol (ZEBETA) 10 MG tablet Take 1 tablet (10 mg total) by mouth daily. 07/23/18  Yes Wieting, Richard, MD  enoxaparin (LOVENOX) 60 MG/0.6ML injection Inject 0.6 mLs (60 mg total) into the skin every 12 (twelve) hours. 07/22/18  Yes Wieting, Richard, MD  ferrous sulfate 325 (65 FE) MG tablet Take 325 mg by mouth daily with breakfast.   Yes [provider]  fluticasone (FLONASE) 50 MCG/ACT nasal spray Place 1 spray into both nostrils daily as needed for allergies.    Yes [provider]  furosemide (LASIX) 40 MG tablet Take 1 tablet (40 mg total) by mouth 2 (two) times daily. 07/22/18  Yes Wieting, Richard, MD  gabapentin (NEURONTIN) 100 MG capsule Take 200 mg by mouth at bedtime.  09/29/15  Yes [provider]  ipratropium-albuterol (DUONEB) 0.5-2.5 (3) MG/3ML SOLN Take 3 mLs by nebulization every 6 (six) hours as needed. DX:J44.9 Patient taking differently: Take 3 mLs by nebulization every 6 (six) hours as needed (for wheezing/shortness of breath).  10/28/17  Yes Kasa, Maretta Bees, MD  levothyroxine (SYNTHROID, LEVOTHROID) 88 MCG tablet Take 88 mcg by mouth daily before breakfast.   Yes [provider]  losartan (COZAAR) 25 MG tablet Take 1  tablet (25 mg total) by mouth daily. 07/23/18  Yes Wieting, Richard, MD  Multiple Vitamins-Minerals (PRESERVISION AREDS 2 PO)  Take 2 tablets by mouth daily.   Yes [provider]  pantoprazole (PROTONIX) 40 MG tablet Take 40 mg by mouth 2 (two) times daily.    Yes [provider]  potassium chloride SA (K-DUR,KLOR-CON) 20 MEQ tablet Take 1 tablet (20 mEq total) by mouth 2 (two) times daily. 07/22/18  Yes Loletha Grayer, MD  warfarin (COUMADIN) 3 MG tablet Take 1 tablet (3 mg total) by mouth one time only at 6 PM. 07/22/18  Yes Loletha Grayer, MD    Review of Systems  Constitutional: Positive for fatigue. Negative for appetite change.  HENT: Negative for congestion, postnasal drip and sore throat.   Eyes: Negative.   Respiratory: Negative for cough, chest tightness and shortness of breath.   Cardiovascular: Negative for chest pain, palpitations and leg swelling.  Gastrointestinal: Negative for abdominal distention and abdominal pain.  Endocrine: Negative.   Genitourinary: Negative.   Musculoskeletal: Negative for back pain and neck pain.  Skin: Negative.   Allergic/Immunologic: Negative.   Neurological: Positive for light-headedness. Negative for dizziness.  Hematological: Negative for adenopathy. Bruises/bleeds easily.  Psychiatric/Behavioral: Negative for dysphoric mood and sleep disturbance (wearing oxygen at 2 L at bedtime). The patient is not nervous/anxious.     Vitals:   07/27/18 1302  BP: (!) 110/46  Pulse: 63  Resp: 18  SpO2: 97%  Weight: 128 lb 6 oz (58.2 kg)  Height: 5\' 4"  (1.626 m)   Wt Readings from Last 3 Encounters:  07/27/18 128 lb 6 oz (58.2 kg)  07/22/18 131 lb 4.8 oz (59.6 kg)  06/02/18 130 lb 8 oz (59.2 kg)   Lab Results  Component Value Date   CREATININE 0.80 07/21/2018   CREATININE 0.82 07/20/2018   CREATININE 0.88 07/18/2018    Physical Exam  Constitutional: She is oriented to person, place, and time. She appears well-developed and well-nourished.  HENT:  Head: Normocephalic and atraumatic.  Right Ear: Decreased hearing is noted.  Left Ear:  Decreased hearing is noted.  Neck: Normal range of motion. Neck supple. No JVD present.  Cardiovascular: Regular rhythm. Bradycardia present.  Pulmonary/Chest: Effort normal. No respiratory distress. She has no wheezes. She has no rales.  Abdominal: Soft. She exhibits no distension. There is no tenderness.  Musculoskeletal: She exhibits no edema or tenderness.  Neurological: She is alert and oriented to person, place, and time.  Skin: Skin is warm and dry.  Psychiatric: She has a normal mood and affect. Her behavior is normal. Thought content normal.  Nursing note and vitals reviewed.  Assessment & Plan:  1: Chronic heart failure with reduced ejection fraction- - NYHA class II - euvolemic today - weighing daily and reviewed the importance of calling for an overnight weight gain of >2 pounds or a weekly weight gain of >5 pounds - not adding salt and has been reading food labels. Discussed the importance of closely following a 2000mg  sodium diet and written dietary information was given to her about this.  - saw cardiology Sharolyn Douglas) 07/17/18 - doubt that her BP could tolerate changing her losartan to entresto - on target dose of bisoprolol - BNP 07/17/18 was 477.0 - she says that she's received her flu and shingles vaccines  2: Atrial fibrillation- - cardioverted 07/20/18 - on amiodarone, bisoprolol and warfarin  3: HTN- - BP looks good today - saw PCP Raechel Ache) 07/16/18 - BMP 07/21/18 reviewed and  showed sodium 142, potassium 3.7, creatinine 0.80 and GFR >60  Patient did not bring her medications nor a list. Each medication was verbally reviewed with the patient and she was encouraged to bring the bottles to every visit to confirm accuracy of list.  Return in 6 weeks or sooner for any questions/problems before then.

## 2018-07-27 NOTE — Patient Instructions (Signed)
Please continue the dosage the hospital put you on - 1-3 mg tablet every day. Recheck in 1 week.

## 2018-07-27 NOTE — Patient Instructions (Signed)
Continue weighing daily and call for an overnight weight gain of > 2 pounds or a weekly weight gain of >5 pounds. 

## 2018-07-28 NOTE — Telephone Encounter (Signed)
Second attempt made, however unable to reach or leave message.  No further attempts at this time.

## 2018-08-01 NOTE — Progress Notes (Signed)
Cardiology Office Note Date:  08/03/2018  Patient ID:  [name], [name] 12-13-1934, MRN [id number] PCP:  Ezequiel Kayser, MD  Cardiologist:  Dr. Fletcher Anon, MD Electrophysiologist: Dr. Caryl Comes, MD    Chief Complaint: Hospital follow up  History of Present Illness: [name] is a 83 y.o. female with history of CAD s/p CABG x 1 with a LIMA to LAD in 04/2015, PAF on Coumadin, mitral valve stenosis and tricuspid regurgitation s/p mitral valve replacement, tricuspid valve replacement, MAZE procedure, left atrial appendage clipping in 12/4313 complicated by sternal wound infection and complete heart block requiring MDT PPM implantation, chronic combined CHF, COPD on nocturnal oxygen, pulmonary hypertension, Meniere's disease, HTN, HLD, and hypothyroidism who presents for hospital follow up after recent admission to [facility] from 11/8 to 11/13 for acute on chronic systolic CHF and Afib with RVR.   She was diagnosed with Afib and acute systolic CHF in 12/84 with an initial EF of 35-40%. Cardiac cath revealed moderate proximal LAD disease with TEE revealing moderate tricuspid regurgitation, PAH, and moderate to severe MR. She underwent DCCV at that time. Follow up echo showed persistent severe MR despite improvement in LV function. She was subsequently referred to thoracic surgery and underwent mitral valve replacement with a bovine valve, tricuspid valve repair, MAZE procedure, left atrial appendage clipping, and CABG x 1 with a LIMA to LAD. Postoperative course was complicated by sternal wound infection and complete heart block requiring Medtronic permanent pacemaker. She was subsequently discharged on amiodarone and Coumadin in the setting of PAF and amiodarone was eventually discontinued. She has been on chronic Coumadin since. In 08/2017, she was seen with complaints of increasing lower extremity swelling and abdominal girth. Follow-up echo again showed LV dysfunction with an EF of 30 to 35%. She was seen by EP  and it was felt that LV dysfunction was in the context of RV apical pacing. That was inactivated and she noted immediate improvement in symptoms. Follow-up echocardiogram in 09/2016 showed improvement in LV function with an EF of 50 to 55% and normal functioning valves. At some point amiodarone was discontinued in the absence of recurrent Afib. She was seen by EP in 05/2018 noting some exercise intolerance with symptoms improving following adjustments in her PPM. However, she was seen on 11/8 in the office noting a 3 week history of DOE and fatigue. She noted some tachy-palpitations and ankle edema. Echo performed that day showed an EF of 20-25%, mild concentric LVH, normal functioning prosthetic mitral valve, mildly dilated LA, moderately dilated RV with moderately reduced RVSF. She was noted to be in Afib with RVR with ventricular rates in the 120s bpm. She was sent to the ED and subsequently admitted, diuresed, and rate controlled. She was loaded with IV amiodarone and underwent successful DCCV on 11/11 to an AV paced rhythm.   Discharge cardiac medications: amiodarone, ASA, bisoprolol, Lovenox, lasix 40 mg bid, losartan , KCl, Coumadin.   Discharge labs: K+ 3.9, WBC 7.0, HGB 9.9, SCr 0.80  Discharge weight: 59.6 kg  Most recent INR: 2.0 from 08/03/2018  She was seen by the [facility] on 07/27/2018 with a weight of 58.2 kg, BP 110/46. No changes.   She comes in accompanied by her husband today.  She indicates she has not felt this well for many months. No symptoms concerning for recurrence of Afib. Her energy is much improved and back to baseline. She has not taken simvastatin since her hospital discharge as there was concern some of  her symptoms may have been related to statin use. She would like to retry simvastatin at a lower dose. She is compliant with all medications. Weight is stable at 58.2 kg today. No lower extremity swelling, orthopnea, abdominal distension, or early satiety. No  chest pain, SOB, dizziness, presyncope, or syncope. She has follow up with EP on 08/25/18. PCP checked follow up BMET on 07/28/18 that showed a potassium of 4.8, BUN 30, SCr 1.1. She does note a mild, dry cough since her discharge. She previously noted a cough associated with ACEi. She is currently on losartan. She and her husband are please with her improvement.   Past Medical History:  Diagnosis Date  . Anxiety   . Arthritis    "hands" (11/10/2015)  . CAD s/p CABG x 1    a. 04/2015 LIMA to LAD  . Chronic combined systolic (congestive) and diastolic (congestive) heart failure ([place])    a. 01/2015 Echo: EF 50-55%, Gr2 DD (preop valve surgery); b. 08/2015 Echo: EF 30-35%, diff HK. Gr2 DD; c. 09/2016 Echo: EF 50-55%. Nl fxning TV ring. Mildly dil LA.  Marland Kitchen COPD (chronic obstructive pulmonary disease) ([place])   . Coronary artery disease   . GERD (gastroesophageal reflux disease)   . History of colon polyps   . History of mitral valve replacement    a. 04/2015 s/p 27 mm [facility] Mitral bovine bioprosthetic tissue valve  . Hyperlipidemia   . Hypertension   . Hypothyroidism   . 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 04/2015 Complete bilateral atrial lesion set using cryothermy and bipolar radiofrequency ablation with clipping of LA appendage  . Meniere disease   . Meniere's disease   . On home oxygen therapy    "2L at night" (11/10/2015)  . PAF (paroxysmal atrial fibrillation) ([place])   . Pneumonia ~ 2010  . PONV (postoperative nausea and vomiting)    Pt felt like eardrums were gonna pop  . Post-surgical complete heart block, symptomatic    a. 04/2015 s/p MDT [id number] Advisa DR MRI DC PPM.  . Presence of permanent cardiac pacemaker   . Pulmonary hypertension ([place])   . Surgical wound, non healing - chest wall 11/10/2015  . Tricuspid Regurgitation s/p Repair    a. 04/2015 s/p 26 mm Edwards mc3 ring annuloplasty  . Wound infection 10/13/2015   Superficial sternal wound infection    Past Surgical  History:  Procedure Laterality Date  . APPENDECTOMY    . APPLICATION OF A-CELL OF CHEST/ABDOMEN N/A 11/21/2015   Procedure: APPLICATION OF A-CELL OF CHEST/ABDOMEN;  Surgeon: Loel Lofty Dillingham, DO;  Location: [place];  Service: Plastics;  Laterality: N/A;  . APPLICATION OF A-CELL OF CHEST/ABDOMEN Right 12/21/2015   Procedure: APPLICATION OF A-CELL OF RIGHT CHEST;  Surgeon: Loel Lofty Dillingham, DO;  Location: [place];  Service: Plastics;  Laterality: Right;  . APPLICATION OF A-CELL OF CHEST/ABDOMEN Right 01/11/2016   Procedure: APPLICATION OF A-CELL OF RIGHT CHEST;  Surgeon: Loel Lofty Dillingham, DO;  Location: [place];  Service: Plastics;  Laterality: Right;  . APPLICATION OF A-CELL OF CHEST/ABDOMEN Right 02/12/2016   Procedure: APPLICATION OF A-CELL TO RIGHT CHEST WOUND;  Surgeon: Loel Lofty Dillingham, DO;  Location: [place];  Service: Plastics;  Laterality: Right;  . APPLICATION OF WOUND VAC N/A 05/09/2015   Procedure: APPLICATION OF WOUND VAC;  Surgeon: Rexene Alberts, MD;  Location: [place];  Service: Thoracic;  Laterality: N/A;  . APPLICATION OF WOUND VAC N/A 11/13/2015  Procedure: APPLICATION OF WOUND VAC;  Surgeon: Rexene Alberts, MD;  Location: [place];  Service: Thoracic;  Laterality: N/A;  . APPLICATION OF WOUND VAC N/A 11/21/2015   Procedure: APPLICATION OF WOUND VAC;  Surgeon: Loel Lofty Dillingham, DO;  Location: [place];  Service: Plastics;  Laterality: N/A;  . APPLICATION OF WOUND VAC Right 12/21/2015   Procedure: APPLICATION OF WOUND VAC to right chest wall ;  Surgeon: Loel Lofty Dillingham, DO;  Location: [place];  Service: Plastics;  Laterality: Right;  . APPLICATION OF WOUND VAC Right 01/11/2016   Procedure: APPLICATION OF WOUND VAC RIGHT CHEST ;  Surgeon: Loel Lofty Dillingham, DO;  Location: [place];  Service: Plastics;  Laterality: Right;  . APPLICATION OF WOUND VAC Right 01/24/2016   Procedure: APPLICATION OF WOUND VAC RIGHT CHEST WALL;  Surgeon: Loel Lofty Dillingham, DO;  Location: [place];  Service:  Plastics;  Laterality: Right;  . APPLICATION OF WOUND VAC Right 02/12/2016   Procedure: RE-APPLICATION OF WOUND VAC TO RIGHT CHEST WOUND;  Surgeon: Loel Lofty Dillingham, DO;  Location: [place];  Service: Plastics;  Laterality: Right;  . BREAST BIOPSY Left 11/25/06   neg  . BREAST BIOPSY Left 01/20/12   /clip-neg  . CARDIAC CATHETERIZATION  11/2013   [facility]  . CARDIAC CATHETERIZATION  10/2014   [facility]  . CARDIAC VALVE REPLACEMENT    . CARDIOVERSION N/A 07/20/2018   Procedure: CARDIOVERSION;  Surgeon: Wellington Hampshire, MD;  Location: [facility];  Service: Cardiovascular;  Laterality: N/A;  . CATARACT EXTRACTION W/ INTRAOCULAR LENS  IMPLANT, BILATERAL Bilateral 2014  . CLIPPING OF ATRIAL APPENDAGE N/A 04/25/2015   Procedure: CLIPPING OF ATRIAL APPENDAGE;  Surgeon: Rexene Alberts, MD;  Location: [place];  Service: Open Heart Surgery;  Laterality: N/A;  . COCHLEAR IMPLANT Left 2005?  . COLONOSCOPY WITH PROPOFOL N/A 01/20/2018   Procedure: COLONOSCOPY WITH PROPOFOL;  Surgeon: Toledo, Benay Pike, MD;  Location: [facility];  Service: Gastroenterology;  Laterality: N/A;  . CORONARY ANGIOPLASTY    . CORONARY ARTERY BYPASS GRAFT N/A 04/25/2015   Procedure: CORONARY ARTERY BYPASS GRAFTING (CABG) x ONE, using left internal mammary artery;  Surgeon: Rexene Alberts, MD;  Location: [place];  Service: Open Heart Surgery;  Laterality: N/A;  . DILATION AND CURETTAGE OF UTERUS  "several before hysterectomy"  . EP IMPLANTABLE DEVICE N/A 05/02/2015   Procedure: Pacemaker Implant;  Surgeon: Thompson Grayer, MD;  Location: [place] [facility];  Service: Cardiovascular;  Laterality: N/A;  . ESOPHAGOGASTRODUODENOSCOPY (EGD) WITH PROPOFOL N/A 01/20/2018   Procedure: ESOPHAGOGASTRODUODENOSCOPY (EGD) WITH PROPOFOL;  Surgeon: Toledo, Benay Pike, MD;  Location: [facility];  Service: Gastroenterology;  Laterality: N/A;  . EYE SURGERY    . I&D EXTREMITY Right 12/06/2015   Procedure: IRRIGATION AND DEBRIDEMENT RIGHT CHEST WALL WITH ACELL  PLACEMENT AND VAC;  Surgeon: Loel Lofty Dillingham, DO;  Location: [place];  Service: Plastics;  Laterality: Right;  . INCISION AND DRAINAGE OF WOUND N/A 11/21/2015   Procedure: IRRIGATION AND DEBRIDEMENT WOUND;  Surgeon: Loel Lofty Dillingham, DO;  Location: [place];  Service: Plastics;  Laterality: N/A;  . INCISION AND DRAINAGE OF WOUND Right 12/21/2015   Procedure: IRRIGATION AND DEBRIDEMENT right chest wall WOUND;  Surgeon: Loel Lofty Dillingham, DO;  Location: [place];  Service: Plastics;  Laterality: Right;  right chest wall   . INCISION AND DRAINAGE OF WOUND Right 01/24/2016   Procedure: IRRIGATION AND DEBRIDEMENT RIGHT CHEST WALL WOUND;  Surgeon: Loel Lofty Dillingham, DO;  Location:  [place] [facility];  Service: Plastics;  Laterality: Right;  . INCISION AND DRAINAGE OF WOUND Right 03/28/2016   Procedure: IRRIGATION AND DEBRIDEMENT RIGHT CHEST WALL WOUND WITH A Cell Placement;  Surgeon: Loel Lofty Dillingham, DO;  Location: [place];  Service: Plastics;  Laterality: Right;  . INCISION AND DRAINAGE OF WOUND Right 05/17/2016   Procedure: IRRIGATION AND DEBRIDEMENT OF RIGHT CHEST WOUND WITH A CELL PLACEMENT;  Surgeon: Loel Lofty Dillingham, DO;  Location: [place];  Service: Plastics;  Laterality: Right;  . INSERT / REPLACE / REMOVE PACEMAKER    . IRRIGATION AND DEBRIDEMENT OF WOUND WITH SPLIT THICKNESS SKIN GRAFT Right 01/11/2016   Procedure: IRRIGATION AND DEBRIDEMENT OF RIGHT CHEST WOUND ;  Surgeon: Loel Lofty Dillingham, DO;  Location: [place];  Service: Plastics;  Laterality: Right;  . MASTECTOMY, PARTIAL Right 2002   positive  . MAZE N/A 04/25/2015   Procedure: MAZE;  Surgeon: Rexene Alberts, MD;  Location: [place];  Service: Open Heart Surgery;  Laterality: N/A;  . MITRAL VALVE REPAIR N/A 04/25/2015   Procedure: MITRAL VALVE  REPLACEMENT using a 27 mm Edwards Perimount Magna Mitral Ease Valve;  Surgeon: Rexene Alberts, MD;  Location: [place];  Service: Open Heart Surgery;  Laterality: N/A;  . SKIN SPLIT GRAFT Right 02/12/2016    Procedure: IRRIGATION AND DEBRIDEMENT RIGHT CHEST WOUND;  Surgeon: Loel Lofty Dillingham, DO;  Location: [place];  Service: Plastics;  Laterality: Right;  . STERNAL WIRES REMOVAL N/A 06/05/2015   Procedure: STERNAL WIRES REMOVAL;  Surgeon: Rexene Alberts, MD;  Location: [place];  Service: Thoracic;  Laterality: N/A;  . STERNAL WOUND DEBRIDEMENT N/A 05/09/2015   Procedure: STERNAL WOUND DEBRIDEMENT;  Surgeon: Rexene Alberts, MD;  Location: [place];  Service: Thoracic;  Laterality: N/A;  . STERNAL WOUND DEBRIDEMENT N/A 11/13/2015   Procedure: Excisional drainage of RIGHT Chest wall mass and breast mass ;  Surgeon: Rexene Alberts, MD;  Location: [place];  Service: Thoracic;  Laterality: N/A;  . TEE WITHOUT CARDIOVERSION N/A 04/25/2015   Procedure: TRANSESOPHAGEAL ECHOCARDIOGRAM (TEE);  Surgeon: Rexene Alberts, MD;  Location: [place];  Service: Open Heart Surgery;  Laterality: N/A;  . TONSILLECTOMY    . TRAM Right 11/18/2015   Procedure: VRAM Vertical Rectus Abdominus Muscle Flap;  Surgeon: Loel Lofty Dillingham, DO;  Location: [place];  Service: Plastics;  Laterality: Right;  RIght Back  . TRICUSPID VALVE REPLACEMENT N/A 04/25/2015   Procedure: TRICUSPID VALVE REPAIR;  Surgeon: Rexene Alberts, MD;  Location: [place];  Service: Open Heart Surgery;  Laterality: N/A;  . VAGINAL HYSTERECTOMY      Current Meds  Medication Sig  . acetaminophen (TYLENOL) 325 MG tablet Take 2 tablets (650 mg total) by mouth every 4 (four) hours as needed for mild pain (or Fever >/= 101).  . AMBULATORY NON FORMULARY MEDICATION Inogen at Home Oxygen machine with tubing. DX: COPD DX Code: J44.9  . amiodarone (PACERONE) 200 MG tablet Take 1 tablet (200 mg total) by mouth daily.  Marland Kitchen aspirin EC 81 MG EC tablet Take 1 tablet (81 mg total) by mouth daily.  Marland Kitchen azelastine (ASTELIN) 0.1 % nasal spray Place 1 spray into both nostrils 2 (two) times daily as needed for rhinitis or allergies.   . bisoprolol (ZEBETA) 10 MG tablet Take 1 tablet (10 mg total)  by mouth daily.  Marland Kitchen enoxaparin (LOVENOX) 60 MG/0.6ML injection Inject 0.6 mLs (60 mg total) into the skin every 12 (twelve) hours.  Marland Kitchen  ferrous sulfate 325 (65 FE) MG tablet Take 325 mg by mouth daily with breakfast.  . fluticasone (FLONASE) 50 MCG/ACT nasal spray Place 1 spray into both nostrils daily as needed for allergies.   . furosemide (LASIX) 40 MG tablet Take 1 tablet (40 mg total) by mouth 2 (two) times daily.  Marland Kitchen gabapentin (NEURONTIN) 100 MG capsule Take 200 mg by mouth at bedtime.   Marland Kitchen ipratropium-albuterol (DUONEB) 0.5-2.5 (3) MG/3ML SOLN Take 3 mLs by nebulization every 6 (six) hours as needed. DX:J44.9 (Patient taking differently: Take 3 mLs by nebulization every 6 (six) hours as needed (for wheezing/shortness of breath). )  . levothyroxine (SYNTHROID, LEVOTHROID) 88 MCG tablet Take 88 mcg by mouth daily before breakfast.  . losartan (COZAAR) 25 MG tablet Take 1 tablet (25 mg total) by mouth daily.  . Multiple Vitamins-Minerals (PRESERVISION AREDS 2 PO) Take 2 tablets by mouth daily.  . pantoprazole (PROTONIX) 40 MG tablet Take 40 mg by mouth 2 (two) times daily.   . potassium chloride SA (K-DUR,KLOR-CON) 20 MEQ tablet Take 1 tablet (20 mEq total) by mouth 2 (two) times daily.  Marland Kitchen warfarin (COUMADIN) 3 MG tablet Take 1 tablet (3 mg total) by mouth one time only at 6 PM.    Allergies:   Ace inhibitors; Amoxicillin-pot clavulanate; Penicillins; Nifedipine; Propranolol; Diazepam; Meperidine; and Propoxyphene   Social History:  The patient  reports that she quit smoking about 20 years ago. Her smoking use included cigarettes. She has a 60.00 pack-year smoking history. She has never used smokeless tobacco. She reports that she drinks alcohol. She reports that she does not use drugs.   Family History:  The patient's family history includes Breast cancer in her maternal aunt and paternal aunt; CVA in her mother; Heart attack in her paternal uncle; Heart disease in her brother and father.  ROS:    Review of Systems  Constitutional: Positive for malaise/fatigue. Negative for chills, diaphoresis, fever and weight loss.       Much improved fatigue   HENT: Negative for congestion.   Eyes: Negative for discharge and redness.  Respiratory: Positive for cough. Negative for hemoptysis, sputum production, shortness of breath and wheezing.   Cardiovascular: Negative for chest pain, palpitations, orthopnea, claudication, leg swelling and PND.  Gastrointestinal: Negative for abdominal pain, blood in stool, heartburn, melena, nausea and vomiting.  Genitourinary: Negative for hematuria.  Musculoskeletal: Negative for falls and myalgias.       Resolved myalgias   Skin: Negative for rash.  Neurological: Negative for dizziness, tingling, tremors, sensory change, speech change, focal weakness, loss of consciousness and weakness.  Endo/Heme/Allergies: Does not bruise/bleed easily.  Psychiatric/Behavioral: Negative for substance abuse. The patient is not nervous/anxious.      PHYSICAL EXAM:  VS:  BP 112/60 (BP Location: Left Arm, Patient Position: Sitting, Cuff Size: Normal)   Pulse 61   Ht 5\' 2"  (1.575 m)   Wt 128 lb 4 oz (58.2 kg)   BMI 23.46 kg/m  BMI: Body mass index is 23.46 kg/m.  Physical Exam  Constitutional: She is oriented to person, place, and time. She appears well-developed and well-nourished.  HENT:  Head: Normocephalic and atraumatic.  Eyes: Right eye exhibits no discharge. Left eye exhibits no discharge.  Neck: Normal range of motion. No JVD present.  Cardiovascular: Normal rate, regular rhythm, S1 normal, S2 normal and normal heart sounds. Exam reveals no distant heart sounds, no friction rub, no midsystolic click and no opening snap.  No murmur heard. Pulses:  Posterior tibial pulses are 2+ on the right side, and 2+ on the left side.  Pulmonary/Chest: Effort normal and breath sounds normal. No respiratory distress. She has no decreased breath sounds. She has no  wheezes. She has no rales. She exhibits no tenderness.  Abdominal: Soft. She exhibits no distension. There is no tenderness.  Musculoskeletal: She exhibits no edema.  Neurological: She is alert and oriented to person, place, and time.  Skin: Skin is warm and dry. No cyanosis. Nails show no clubbing.  Psychiatric: She has a normal mood and affect. Her speech is normal and behavior is normal. Judgment and thought content normal.     EKG:  Was ordered and interpreted by me today. Shows AV paced rhythm 61 bpm  Recent Labs: 05/13/2018: Magnesium 2.2 07/17/2018: B Natriuretic Peptide 477.0 07/20/2018: TSH 2.168 07/21/2018: BUN 21; Creatinine, Ser 0.80; Sodium 142 07/22/2018: Hemoglobin 9.9; Platelets 225; Potassium 3.9  No results found for requested labs within last 8760 hours.   Estimated Creatinine Clearance: 43.6 mL/min (by C-G formula based on SCr of 0.8 mg/dL).   Wt Readings from Last 3 Encounters:  08/03/18 128 lb 4 oz (58.2 kg)  07/27/18 128 lb 6 oz (58.2 kg)  07/22/18 131 lb 4.8 oz (59.6 kg)     Other studies reviewed: Additional studies/records reviewed today include: summarized above  ASSESSMENT AND PLAN:  1. CAD involving the native coronary arteries s/p CABG without angina: She is doing well without symptoms concerning for angina at this time. She remains on ASA and bisoprolol. We are restarting her simvastatin at 10 mg daily (previously on 20 mg daily). Aggressive secondary prevention.   2. PAF: Currently with AV paced rhythm. Continue bisoprolol and amiodarone 200 mg daily. Remains on Coumadin with a goal INR 2-3 with an INR today of 2.0. Given her cough, we will check PFTs as she is currently on amiodarone. Recent CXR not acute. Recent TSH normal. Check LFT in follow up.    3. Chronic combined CHF secondary to mixed ICM and NICM: She does not appear grossly volume up. Continue bisoprolol and losartan. Decrease Lasix to 40 mg daily as well as KCl to 20 mEq daily given bump  in her BUN/SCR when checked at PCP office on 07/28/18. She has reported a mild cough since her discharge and hs previously noted an ACEi-induced cough. For now, given her cardiomyopathy, we will continue her on losartan with plans to repeat limited echo to evaluate LVSF in ~ 1 month (scheduled for 08/24/2018). Not currently on spironolactone given mild AKI. CHF education.   4. Valvular heart disease: Normal functioning mitral valve replacement and tricuspid valve repair by echo 07/17/2018. Continue to monitor with periodic echo.   5. Complete heart block: Status post MDT PPM. Device appears to be functioning normally. Followed by EP.   6. Pulmonary hypertension: As above.   7. HTN: Blood pressure is well controlled today at 112/60. Continue current medications as above.   8. HLD: LDL of 56 from 07/16/2018. Restart simvastatin at 10 mg daily (prior dose of 20 mg daily).   Disposition: F/u with Dr. Fletcher Anon or an APP in 09/2018. Keep scheduled EP appointment on 08/25/18.   Current medicines are reviewed at length with the patient today.  The patient did not have any concerns regarding medicines.  Signed, Christell Faith, PA-C 08/03/2018 2:08 PM     [address] [place], [place] [address] [phone]

## 2018-08-03 ENCOUNTER — Ambulatory Visit (INDEPENDENT_AMBULATORY_CARE_PROVIDER_SITE_OTHER): Payer: Medicare Other | Admitting: Physician Assistant

## 2018-08-03 ENCOUNTER — Encounter: Payer: Self-pay | Admitting: Physician Assistant

## 2018-08-03 ENCOUNTER — Ambulatory Visit (INDEPENDENT_AMBULATORY_CARE_PROVIDER_SITE_OTHER): Payer: Medicare Other

## 2018-08-03 VITALS — BP 112/60 | HR 61 | Ht 62.0 in | Wt 128.2 lb

## 2018-08-03 DIAGNOSIS — I480 Paroxysmal atrial fibrillation: Secondary | ICD-10-CM

## 2018-08-03 DIAGNOSIS — I2510 Atherosclerotic heart disease of native coronary artery without angina pectoris: Secondary | ICD-10-CM | POA: Diagnosis not present

## 2018-08-03 DIAGNOSIS — I272 Pulmonary hypertension, unspecified: Secondary | ICD-10-CM

## 2018-08-03 DIAGNOSIS — I10 Essential (primary) hypertension: Secondary | ICD-10-CM

## 2018-08-03 DIAGNOSIS — I38 Endocarditis, valve unspecified: Secondary | ICD-10-CM

## 2018-08-03 DIAGNOSIS — I340 Nonrheumatic mitral (valve) insufficiency: Secondary | ICD-10-CM | POA: Diagnosis not present

## 2018-08-03 DIAGNOSIS — Z5181 Encounter for therapeutic drug level monitoring: Secondary | ICD-10-CM | POA: Diagnosis not present

## 2018-08-03 DIAGNOSIS — Z953 Presence of xenogenic heart valve: Secondary | ICD-10-CM

## 2018-08-03 DIAGNOSIS — I5042 Chronic combined systolic (congestive) and diastolic (congestive) heart failure: Secondary | ICD-10-CM | POA: Diagnosis not present

## 2018-08-03 DIAGNOSIS — I442 Atrioventricular block, complete: Secondary | ICD-10-CM

## 2018-08-03 DIAGNOSIS — E785 Hyperlipidemia, unspecified: Secondary | ICD-10-CM

## 2018-08-03 LAB — POCT INR: INR: 2 (ref 2.0–3.0)

## 2018-08-03 MED ORDER — BISOPROLOL FUMARATE 10 MG PO TABS: 10.0000 mg | ORAL_TABLET | Freq: Every day | ORAL | 0 refills | Status: DC

## 2018-08-03 MED ORDER — AMIODARONE HCL 200 MG PO TABS: 200.0000 mg | ORAL_TABLET | Freq: Every day | ORAL | 0 refills | Status: DC

## 2018-08-03 MED ORDER — FUROSEMIDE 40 MG PO TABS: 40.0000 mg | ORAL_TABLET | Freq: Every day | ORAL | 0 refills | Status: DC

## 2018-08-03 MED ORDER — SIMVASTATIN 10 MG PO TABS: 10.0000 mg | ORAL_TABLET | Freq: Every day | ORAL | 3 refills | Status: DC

## 2018-08-03 MED ORDER — POTASSIUM CHLORIDE CRYS ER 20 MEQ PO TBCR: 20.0000 meq | EXTENDED_RELEASE_TABLET | Freq: Every day | ORAL | 0 refills | Status: DC

## 2018-08-03 MED ORDER — LOSARTAN POTASSIUM 25 MG PO TABS: 25.0000 mg | ORAL_TABLET | Freq: Every day | ORAL | 0 refills | Status: DC

## 2018-08-03 NOTE — Patient Instructions (Signed)
Please resume previous dosage of 2.5 mg tablets: 1.5 tablets every day EXCEPT 2 TABLETS ON MONDAYS. Recheck in 3 weeks.

## 2018-08-03 NOTE — Patient Instructions (Addendum)
Medication Instructions:  RESTART Simvastatin 10 mg daily DECREASE the Furosemide to 40 mg daily DECREASE the potassium to 20 mEq daily  If you need a refill on your cardiac medications before your next appointment, please call your pharmacy.   Lab work: None ordered  Testing/Procedures: Your physician has requested that you have an limited echocardiogram. Echocardiography is a painless test that uses sound waves to create images of your heart. It provides your doctor with information about the size and shape of your heart and how well your heart's chambers and valves are working. You may receive an ultrasound enhancing agent through an IV if needed to better visualize your heart during the echo.This procedure takes approximately one hour. There are no restrictions for this procedure. This will take place at the [facility] clinic.    Follow-Up: At [facility], you and your health needs are our priority.  As part of our continuing mission to provide you with exceptional heart care, we have created designated Provider Care Teams.  These Care Teams include your primary Cardiologist (physician) and Advanced Practice Providers (APPs -  Physician Assistants and Nurse Practitioners) who all work together to provide you with the care you need, when you need it. You will need a follow up appointment in 1 months ( the first of the year)   You may see Kathlyn Sacramento, MD or one of the following Advanced Practice Providers on your designated Care Team:   Murray Hodgkins, NP Christell Faith, PA-C . Marrianne Mood, PA-C

## 2018-08-24 ENCOUNTER — Ambulatory Visit (INDEPENDENT_AMBULATORY_CARE_PROVIDER_SITE_OTHER): Payer: Medicare Other

## 2018-08-24 DIAGNOSIS — I5042 Chronic combined systolic (congestive) and diastolic (congestive) heart failure: Secondary | ICD-10-CM | POA: Diagnosis not present

## 2018-08-25 ENCOUNTER — Telehealth: Payer: Self-pay

## 2018-08-25 ENCOUNTER — Encounter: Payer: Self-pay | Admitting: Internal Medicine

## 2018-08-25 ENCOUNTER — Ambulatory Visit (INDEPENDENT_AMBULATORY_CARE_PROVIDER_SITE_OTHER): Payer: Medicare Other | Admitting: Internal Medicine

## 2018-08-25 VITALS — BP 117/61 | HR 61 | Ht 61.0 in | Wt 127.5 lb

## 2018-08-25 DIAGNOSIS — Z950 Presence of cardiac pacemaker: Secondary | ICD-10-CM | POA: Diagnosis not present

## 2018-08-25 DIAGNOSIS — I5042 Chronic combined systolic (congestive) and diastolic (congestive) heart failure: Secondary | ICD-10-CM | POA: Diagnosis not present

## 2018-08-25 DIAGNOSIS — I442 Atrioventricular block, complete: Secondary | ICD-10-CM | POA: Diagnosis not present

## 2018-08-25 DIAGNOSIS — Z79899 Other long term (current) drug therapy: Secondary | ICD-10-CM

## 2018-08-25 DIAGNOSIS — I495 Sick sinus syndrome: Secondary | ICD-10-CM

## 2018-08-25 DIAGNOSIS — I2510 Atherosclerotic heart disease of native coronary artery without angina pectoris: Secondary | ICD-10-CM | POA: Diagnosis not present

## 2018-08-25 MED ORDER — FUROSEMIDE 40 MG PO TABS: 40.0000 mg | ORAL_TABLET | Freq: Every day | ORAL | 0 refills | Status: DC

## 2018-08-25 NOTE — Telephone Encounter (Signed)
Patient returning call.

## 2018-08-25 NOTE — Progress Notes (Signed)
See reply Patient Care Team: Ezequiel Kayser, MD as PCP - General (Internal Medicine) Wellington Hampshire, MD as PCP - Cardiology (Cardiology) Alisa Graff, FNP as Nurse Practitioner (Family Medicine)   HPI  [name] is a 83 y.o. female Seen in follow-up for a pacemaker implanted 8/16 in the context of multi-valvular surgery bioprosthetic mitral valve replacement, tricuspid valve repair and MAZE operation. She developed postoperative complete heart block [facility])  She has a history of atrial fibrillation she also developed a sternal wound infection requiring debridement.  She is anticoagulated with Coumadin and aspirin  DATE TEST EF   5/16 Echo   65 %   12/16 Echo   30-35 %   1/18 Echo  55%   11/19 Echo  20-25%   12/19 Echo  35%     At her last visit, there were complaints of exercise intolerance.  We decreased rate response slope as well as the ADL rate from 100--85.  She was better.   With interval complaints of worsening exercise tolerance she was admitted for echocardiogram.  11/19 she was noted to be in rapid atrial fibrillation.  She was admitted 11/19 and started on amiodarone with a marked improvement in symptoms  Date Cr K Hgb TSH LFTs  8/17     9.2    5/18 0.8  11.5    5/19 0.8 3.9 13.5 (MCV 103<<89)    11/19 0.8 3.9 9.9 2.168 --                   No bleeding     Past Medical History:  Diagnosis Date  . Anxiety   . Arthritis    "hands" (11/10/2015)  . CAD s/p CABG x 1    a. 04/2015 LIMA to LAD  . Chronic combined systolic (congestive) and diastolic (congestive) heart failure ([place])    a. 01/2015 Echo: EF 50-55%, Gr2 DD (preop valve surgery); b. 08/2015 Echo: EF 30-35%, diff HK. Gr2 DD; c. 09/2016 Echo: EF 50-55%. Nl fxning TV ring. Mildly dil LA.  Marland Kitchen COPD (chronic obstructive pulmonary disease) ([place])   . Coronary artery disease   . GERD (gastroesophageal reflux disease)   . History of colon polyps   . History of mitral valve replacement    a. 04/2015 s/p  27 mm [facility] Mitral bovine bioprosthetic tissue valve  . Hyperlipidemia   . Hypertension   . Hypothyroidism   . 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 04/2015 Complete bilateral atrial lesion set using cryothermy and bipolar radiofrequency ablation with clipping of LA appendage  . Meniere disease   . Meniere's disease   . On home oxygen therapy    "2L at night" (11/10/2015)  . PAF (paroxysmal atrial fibrillation) ([place])   . Pneumonia ~ 2010  . PONV (postoperative nausea and vomiting)    Pt felt like eardrums were gonna pop  . Post-surgical complete heart block, symptomatic    a. 04/2015 s/p MDT [id number] Advisa DR MRI DC PPM.  . Presence of permanent cardiac pacemaker   . Pulmonary hypertension ([place])   . Surgical wound, non healing - chest wall 11/10/2015  . Tricuspid Regurgitation s/p Repair    a. 04/2015 s/p 26 mm Edwards mc3 ring annuloplasty  . Wound infection 10/13/2015   Superficial sternal wound infection    Past Surgical History:  Procedure Laterality Date  . APPENDECTOMY    . APPLICATION OF A-CELL OF CHEST/ABDOMEN N/A 11/21/2015  Procedure: APPLICATION OF A-CELL OF CHEST/ABDOMEN;  Surgeon: Loel Lofty Dillingham, DO;  Location: [place];  Service: Plastics;  Laterality: N/A;  . APPLICATION OF A-CELL OF CHEST/ABDOMEN Right 12/21/2015   Procedure: APPLICATION OF A-CELL OF RIGHT CHEST;  Surgeon: Loel Lofty Dillingham, DO;  Location: [place];  Service: Plastics;  Laterality: Right;  . APPLICATION OF A-CELL OF CHEST/ABDOMEN Right 01/11/2016   Procedure: APPLICATION OF A-CELL OF RIGHT CHEST;  Surgeon: Loel Lofty Dillingham, DO;  Location: [place];  Service: Plastics;  Laterality: Right;  . APPLICATION OF A-CELL OF CHEST/ABDOMEN Right 02/12/2016   Procedure: APPLICATION OF A-CELL TO RIGHT CHEST WOUND;  Surgeon: Loel Lofty Dillingham, DO;  Location: [place];  Service: Plastics;  Laterality: Right;  . APPLICATION OF WOUND VAC N/A 05/09/2015   Procedure: APPLICATION OF WOUND VAC;  Surgeon: Rexene Alberts, MD;  Location: [place];  Service: Thoracic;  Laterality: N/A;  . APPLICATION OF WOUND VAC N/A 11/13/2015   Procedure: APPLICATION OF WOUND VAC;  Surgeon: Rexene Alberts, MD;  Location: [place];  Service: Thoracic;  Laterality: N/A;  . APPLICATION OF WOUND VAC N/A 11/21/2015   Procedure: APPLICATION OF WOUND VAC;  Surgeon: Loel Lofty Dillingham, DO;  Location: [place];  Service: Plastics;  Laterality: N/A;  . APPLICATION OF WOUND VAC Right 12/21/2015   Procedure: APPLICATION OF WOUND VAC to right chest wall ;  Surgeon: Loel Lofty Dillingham, DO;  Location: [place];  Service: Plastics;  Laterality: Right;  . APPLICATION OF WOUND VAC Right 01/11/2016   Procedure: APPLICATION OF WOUND VAC RIGHT CHEST ;  Surgeon: Loel Lofty Dillingham, DO;  Location: [place];  Service: Plastics;  Laterality: Right;  . APPLICATION OF WOUND VAC Right 01/24/2016   Procedure: APPLICATION OF WOUND VAC RIGHT CHEST WALL;  Surgeon: Loel Lofty Dillingham, DO;  Location: [place];  Service: Plastics;  Laterality: Right;  . APPLICATION OF WOUND VAC Right 02/12/2016   Procedure: RE-APPLICATION OF WOUND VAC TO RIGHT CHEST WOUND;  Surgeon: Loel Lofty Dillingham, DO;  Location: [place];  Service: Plastics;  Laterality: Right;  . BREAST BIOPSY Left 11/25/06   neg  . BREAST BIOPSY Left 01/20/12   /clip-neg  . CARDIAC CATHETERIZATION  11/2013   [facility]  . CARDIAC CATHETERIZATION  10/2014   [facility]  . CARDIAC VALVE REPLACEMENT    . CARDIOVERSION N/A 07/20/2018   Procedure: CARDIOVERSION;  Surgeon: Wellington Hampshire, MD;  Location: [facility];  Service: Cardiovascular;  Laterality: N/A;  . CATARACT EXTRACTION W/ INTRAOCULAR LENS  IMPLANT, BILATERAL Bilateral 2014  . CLIPPING OF ATRIAL APPENDAGE N/A 04/25/2015   Procedure: CLIPPING OF ATRIAL APPENDAGE;  Surgeon: Rexene Alberts, MD;  Location: [place];  Service: Open Heart Surgery;  Laterality: N/A;  . COCHLEAR IMPLANT Left 2005?  . COLONOSCOPY WITH PROPOFOL N/A 01/20/2018   Procedure: COLONOSCOPY WITH PROPOFOL;  Surgeon:  Tol[place], Benay Pike, MD;  Location: [facility];  Service: Gastroenterology;  Laterality: N/A;  . CORONARY ANGIOPLASTY    . CORONARY ARTERY BYPASS GRAFT N/A 04/25/2015   Procedure: CORONARY ARTERY BYPASS GRAFTING (CABG) x ONE, using left internal mammary artery;  Surgeon: Rexene Alberts, MD;  Location: [place];  Service: Open Heart Surgery;  Laterality: N/A;  . DILATION AND CURETTAGE OF UTERUS  "several before hysterectomy"  . EP IMPLANTABLE DEVICE N/A 05/02/2015   Procedure: Pacemaker Implant;  Surgeon: Thompson Grayer, MD;  Location: [place] [facility];  Service: Cardiovascular;  Laterality: N/A;  . ESOPHAGOGASTRODUODENOSCOPY (EGD) WITH PROPOFOL  N/A 01/20/2018   Procedure: ESOPHAGOGASTRODUODENOSCOPY (EGD) WITH PROPOFOL;  Surgeon: Tol[place], Benay Pike, MD;  Location: [facility];  Service: Gastroenterology;  Laterality: N/A;  . EYE SURGERY    . I&D EXTREMITY Right 12/06/2015   Procedure: IRRIGATION AND DEBRIDEMENT RIGHT CHEST WALL WITH ACELL PLACEMENT AND VAC;  Surgeon: Loel Lofty Dillingham, DO;  Location: [place];  Service: Plastics;  Laterality: Right;  . INCISION AND DRAINAGE OF WOUND N/A 11/21/2015   Procedure: IRRIGATION AND DEBRIDEMENT WOUND;  Surgeon: Loel Lofty Dillingham, DO;  Location: [place];  Service: Plastics;  Laterality: N/A;  . INCISION AND DRAINAGE OF WOUND Right 12/21/2015   Procedure: IRRIGATION AND DEBRIDEMENT right chest wall WOUND;  Surgeon: Loel Lofty Dillingham, DO;  Location: [place];  Service: Plastics;  Laterality: Right;  right chest wall   . INCISION AND DRAINAGE OF WOUND Right 01/24/2016   Procedure: IRRIGATION AND DEBRIDEMENT RIGHT CHEST WALL WOUND;  Surgeon: Loel Lofty Dillingham, DO;  Location: [place];  Service: Plastics;  Laterality: Right;  . INCISION AND DRAINAGE OF WOUND Right 03/28/2016   Procedure: IRRIGATION AND DEBRIDEMENT RIGHT CHEST WALL WOUND WITH A Cell Placement;  Surgeon: Loel Lofty Dillingham, DO;  Location: [place];  Service: Plastics;  Laterality: Right;  . INCISION AND  DRAINAGE OF WOUND Right 05/17/2016   Procedure: IRRIGATION AND DEBRIDEMENT OF RIGHT CHEST WOUND WITH A CELL PLACEMENT;  Surgeon: Loel Lofty Dillingham, DO;  Location: [place];  Service: Plastics;  Laterality: Right;  . INSERT / REPLACE / REMOVE PACEMAKER    . IRRIGATION AND DEBRIDEMENT OF WOUND WITH SPLIT THICKNESS SKIN GRAFT Right 01/11/2016   Procedure: IRRIGATION AND DEBRIDEMENT OF RIGHT CHEST WOUND ;  Surgeon: Loel Lofty Dillingham, DO;  Location: [place];  Service: Plastics;  Laterality: Right;  . MASTECTOMY, PARTIAL Right 2002   positive  . MAZE N/A 04/25/2015   Procedure: MAZE;  Surgeon: Rexene Alberts, MD;  Location: [place];  Service: Open Heart Surgery;  Laterality: N/A;  . MITRAL VALVE REPAIR N/A 04/25/2015   Procedure: MITRAL VALVE  REPLACEMENT using a 27 mm Edwards Perimount Magna Mitral Ease Valve;  Surgeon: Rexene Alberts, MD;  Location: [place];  Service: Open Heart Surgery;  Laterality: N/A;  . SKIN SPLIT GRAFT Right 02/12/2016   Procedure: IRRIGATION AND DEBRIDEMENT RIGHT CHEST WOUND;  Surgeon: Loel Lofty Dillingham, DO;  Location: [place];  Service: Plastics;  Laterality: Right;  . STERNAL WIRES REMOVAL N/A 06/05/2015   Procedure: STERNAL WIRES REMOVAL;  Surgeon: Rexene Alberts, MD;  Location: [place];  Service: Thoracic;  Laterality: N/A;  . STERNAL WOUND DEBRIDEMENT N/A 05/09/2015   Procedure: STERNAL WOUND DEBRIDEMENT;  Surgeon: Rexene Alberts, MD;  Location: [place];  Service: Thoracic;  Laterality: N/A;  . STERNAL WOUND DEBRIDEMENT N/A 11/13/2015   Procedure: Excisional drainage of RIGHT Chest wall mass and breast mass ;  Surgeon: Rexene Alberts, MD;  Location: [place];  Service: Thoracic;  Laterality: N/A;  . TEE WITHOUT CARDIOVERSION N/A 04/25/2015   Procedure: TRANSESOPHAGEAL ECHOCARDIOGRAM (TEE);  Surgeon: Rexene Alberts, MD;  Location: [place];  Service: Open Heart Surgery;  Laterality: N/A;  . TONSILLECTOMY    . TRAM Right 11/18/2015   Procedure: VRAM Vertical Rectus Abdominus Muscle Flap;   Surgeon: Loel Lofty Dillingham, DO;  Location: [place];  Service: Plastics;  Laterality: Right;  RIght Back  . TRICUSPID VALVE REPLACEMENT N/A 04/25/2015   Procedure: TRICUSPID VALVE REPAIR;  Surgeon: Rexene Alberts, MD;  Location:  [place] [facility];  Service: Open Heart Surgery;  Laterality: N/A;  . VAGINAL HYSTERECTOMY      Current Outpatient Medications  Medication Sig Dispense Refill  . acetaminophen (TYLENOL) 325 MG tablet Take 2 tablets (650 mg total) by mouth every 4 (four) hours as needed for mild pain (or Fever >/= 101).    . AMBULATORY NON FORMULARY MEDICATION Inogen at Home Oxygen machine with tubing. DX: COPD DX Code: J44.9 1 each 0  . amiodarone (PACERONE) 200 MG tablet Take 1 tablet (200 mg total) by mouth daily. 90 tablet 0  . aspirin EC 81 MG EC tablet Take 1 tablet (81 mg total) by mouth daily.    Marland Kitchen azelastine (ASTELIN) 0.1 % nasal spray Place 1 spray into both nostrils 2 (two) times daily as needed for rhinitis or allergies.     . bisoprolol (ZEBETA) 10 MG tablet Take 1 tablet (10 mg total) by mouth daily. 90 tablet 0  . enoxaparin (LOVENOX) 60 MG/0.6ML injection Inject 0.6 mLs (60 mg total) into the skin every 12 (twelve) hours. 10 Syringe 0  . ferrous sulfate 325 (65 FE) MG tablet Take 325 mg by mouth daily with breakfast.    . fluticasone (FLONASE) 50 MCG/ACT nasal spray Place 1 spray into both nostrils daily as needed for allergies.     . furosemide (LASIX) 40 MG tablet Take 1 tablet (40 mg total) by mouth daily. 90 tablet 0  . gabapentin (NEURONTIN) 100 MG capsule Take 200 mg by mouth at bedtime.   1  . ipratropium-albuterol (DUONEB) 0.5-2.5 (3) MG/3ML SOLN Take 3 mLs by nebulization every 6 (six) hours as needed. DX:J44.9 (Patient taking differently: Take 3 mLs by nebulization every 6 (six) hours as needed (for wheezing/shortness of breath). ) 1080 mL 1  . levothyroxine (SYNTHROID, LEVOTHROID) 88 MCG tablet Take 88 mcg by mouth daily before breakfast.    . losartan (COZAAR) 25 MG tablet  Take 1 tablet (25 mg total) by mouth daily. 90 tablet 0  . Multiple Vitamins-Minerals (PRESERVISION AREDS 2 PO) Take 2 tablets by mouth daily.    . pantoprazole (PROTONIX) 40 MG tablet Take 40 mg by mouth 2 (two) times daily.     . potassium chloride SA (K-DUR,KLOR-CON) 20 MEQ tablet Take 1 tablet (20 mEq total) by mouth daily. 60 tablet 0  . simvastatin (ZOCOR) 10 MG tablet Take 1 tablet (10 mg total) by mouth at bedtime. 90 tablet 3  . warfarin (COUMADIN) 3 MG tablet Take 1 tablet (3 mg total) by mouth one time only at 6 PM. 30 tablet 0   No current facility-administered medications for this visit.     Allergies  Allergen Reactions  . Ace Inhibitors Cough and Other (See Comments)  . Amoxicillin-Pot Clavulanate Swelling and Other (See Comments)    JOINTS, PAIN Joint pain   . Penicillins Swelling and Other (See Comments)    SWOLLEN JOINTS  Has patient had a PCN reaction causing immediate rash, facial/tongue/throat swelling, SOB or lightheadedness with hypotension: Yes Has patient had a PCN reaction causing severe rash involving mucus membranes or skin necrosis: No Has patient had a PCN reaction that required hospitalization No Has patient had a PCN reaction occurring within the last 10 years: No If all of the above answers are "NO", then may proceed with Cephalosporin use.  Swollen joints  . Nifedipine Other (See Comments)    UNSPECIFIED UNSPECIFIED   . Propranolol Other (See Comments)    UNSPECIFIED UNSPECIFIED   . Diazepam Other (  See Comments)    Made her "hyper" Made her "hyper" Made her "hyper"   . Meperidine Other (See Comments)    Made her "hyper" Made her "hyper" Made her "hyper"   . Propoxyphene Other (See Comments)    Made her "hyper" Made her "hyper" Made her "hyper"       Review of Systems negative except from HPI and PMH  Physical Exam BP 117/61 (BP Location: Left Arm, Patient Position: Sitting, Cuff Size: Normal)   Pulse 61   Ht 5' 1"  (1.549 m)    Wt 127 lb 8 oz (57.8 kg)   BMI 24.09 kg/m   Well developed and nourished with mild dyspnea HENT normal Neck supple with JVP-flat Clear Regular rate and rhythm, 2/6 murmurs or gallops Abd-soft with active BS No Clubbing cyanosis tr edema Skin-warm and dry A & Oriented  Grossly normal sensory and motor function  ECG atrial pacing at 63 Intervals 36/11/46 Axis left -35  Assessment and  Plan  Atrial fibrillation-persistent status post maze operation  Amiodarone   Complete heart block -Intermittent  Sinus node dysfunction   First-degree AV block profound  Status post mitral valve replacement-bioprosthetic with tricuspid valve repair  Pacemaker-[employer]tronic  Dypsnea on Exertion   Cardiomyopathy   Macrocytosis    Anemia   Continues to be anemia   ferritin was normal in care everywhere.  Will defer to primary care evaluation of her anemia.?  Bone marrow biopsy with her valve in place, we will check a haptoglobin for hemolytic disease.  Her reticulocyte count has been elevated modestly.  We will continue amiodarone.  Is maintaining sinus rhythm.  We will check LFTs for baseline  Question is as to whether her cardiomyopathy is rate related secondary to the amiodarone or pacemaker related.  We will plan to reassess LV function about 2 months.  If it remains depressed, she will need guideline directed therapy as well as consideration for CRT upgrade.  In the event that is improved we will just continue to follow on current medications.  We spent more than 50% of our >25 min visit in face to face counseling regarding the above

## 2018-08-25 NOTE — Telephone Encounter (Signed)
Patient states she is aware of results from when she saw Dr Caryl Comes earlier today and is aware of plan of care.

## 2018-08-25 NOTE — Patient Instructions (Addendum)
Medication Instructions:  - Your physician recommends that you continue on your current medications as directed. Please refer to the Current Medication list given to you today.  If you need a refill on your cardiac medications before your next appointment, please call your pharmacy.   Lab work: - Your physician recommends that you have lab work today: Liver function/ haptoglobin  - Your physician recommends that you return for lab work in: 2 months (the day of your echo) - TSH/ CBC/ Liver function   If you have labs (blood work) drawn today and your tests are completely normal, you will receive your results only by: Marland Kitchen MyChart Message (if you have MyChart) OR . A paper copy in the mail If you have any lab test that is abnormal or we need to change your treatment, we will call you to review the results.  Testing/Procedures: - Your physician has requested that you have an echocardiogram- in 2 months Echocardiography is a painless test that uses sound waves to create images of your heart. It provides your doctor with information about the size and shape of your heart and how well your heart's chambers and valves are working. This procedure takes approximately one hour. There are no restrictions for this procedure.  Follow-Up: At [facility], you and your health needs are our priority.  As part of our continuing mission to provide you with exceptional heart care, we have created designated Provider Care Teams.  These Care Teams include your primary Cardiologist (physician) and Advanced Practice Providers (APPs -  Physician Assistants and Nurse Practitioners) who all work together to provide you with the care you need, when you need it. . You will need a follow up appointment in 6 months with Dr. Caryl Comes.  Please call our office 2 months in advance to schedule this appointment.    Remote monitoring is used to monitor your Pacemaker of ICD from home. This monitoring reduces the number of office  visits required to check your device to one time per year. It allows [place] to keep an eye on the functioning of your device to ensure it is working properly. You are scheduled for a device check from home on 09/10/18. You may send your transmission at any time that day. If you have a wireless device, the transmission will be sent automatically. After your physician reviews your transmission, you will receive a postcard with your next transmission date.   Any Other Special Instructions Will Be Listed Below (If Applicable). - Please call the Device Clinic at [phone] if you feel like you may be out of rhythm.

## 2018-08-25 NOTE — Telephone Encounter (Signed)
-----   Message from Rise Mu, PA-C sent at 08/24/2018  5:00 PM EST ----- Echo showed EF of 35 to 40% with diffuse hypokinesis and mild thickening of the heart muscle.  A bioprosthetic mitral valve was noted and functioning normally.  The left atrium was mildly dilated.  The right ventricle was mildly dilated with mildly reduced systolic function.  A trivial pericardial effusion was noted.  When compared to her echo from 07/2018 her EF has improved from 20-25% to 35-40%.  This is good news.  Continue current medications.  At follow-up, if renal function and vitals allow would consider initiating spironolactone.  She is scheduled to see EP on 08/25/2018.

## 2018-08-26 ENCOUNTER — Ambulatory Visit (INDEPENDENT_AMBULATORY_CARE_PROVIDER_SITE_OTHER): Payer: Medicare Other

## 2018-08-26 DIAGNOSIS — Z953 Presence of xenogenic heart valve: Secondary | ICD-10-CM

## 2018-08-26 DIAGNOSIS — Z5181 Encounter for therapeutic drug level monitoring: Secondary | ICD-10-CM | POA: Diagnosis not present

## 2018-08-26 DIAGNOSIS — I340 Nonrheumatic mitral (valve) insufficiency: Secondary | ICD-10-CM | POA: Diagnosis not present

## 2018-08-26 LAB — HEPATIC FUNCTION PANEL
ALBUMIN: 4 g/dL (ref 3.5–4.7)
ALK PHOS: 77 IU/L (ref 39–117)
ALT: 20 IU/L (ref 0–32)
AST: 20 IU/L (ref 0–40)
BILIRUBIN TOTAL: 0.3 mg/dL (ref 0.0–1.2)
Bilirubin, Direct: 0.1 mg/dL (ref 0.00–0.40)
TOTAL PROTEIN: 6.3 g/dL (ref 6.0–8.5)

## 2018-08-26 LAB — HAPTOGLOBIN: Haptoglobin: 162 mg/dL (ref 41–333)

## 2018-08-26 LAB — POCT INR: INR: 5.2 — AB (ref 2.0–3.0)

## 2018-08-26 NOTE — Patient Instructions (Signed)
Please skip coumadin tonight and tomorrow, then resume previous dosage of 2.5 mg tablets: 1.5 tablets every day EXCEPT 2 TABLETS ON MONDAYS. Recheck in 3 weeks.

## 2018-09-07 ENCOUNTER — Encounter: Payer: Self-pay | Admitting: Family

## 2018-09-07 ENCOUNTER — Ambulatory Visit: Payer: Medicare Other | Attending: Family | Admitting: Family

## 2018-09-07 VITALS — BP 136/73 | HR 65 | Resp 18 | Ht 61.0 in | Wt 128.1 lb

## 2018-09-07 DIAGNOSIS — I10 Essential (primary) hypertension: Secondary | ICD-10-CM

## 2018-09-07 DIAGNOSIS — Z7901 Long term (current) use of anticoagulants: Secondary | ICD-10-CM | POA: Insufficient documentation

## 2018-09-07 DIAGNOSIS — I5042 Chronic combined systolic (congestive) and diastolic (congestive) heart failure: Secondary | ICD-10-CM | POA: Insufficient documentation

## 2018-09-07 DIAGNOSIS — J449 Chronic obstructive pulmonary disease, unspecified: Secondary | ICD-10-CM | POA: Insufficient documentation

## 2018-09-07 DIAGNOSIS — I480 Paroxysmal atrial fibrillation: Secondary | ICD-10-CM | POA: Diagnosis not present

## 2018-09-07 DIAGNOSIS — I2510 Atherosclerotic heart disease of native coronary artery without angina pectoris: Secondary | ICD-10-CM | POA: Diagnosis not present

## 2018-09-07 DIAGNOSIS — Z9981 Dependence on supplemental oxygen: Secondary | ICD-10-CM | POA: Diagnosis not present

## 2018-09-07 DIAGNOSIS — Z79899 Other long term (current) drug therapy: Secondary | ICD-10-CM | POA: Insufficient documentation

## 2018-09-07 DIAGNOSIS — E785 Hyperlipidemia, unspecified: Secondary | ICD-10-CM | POA: Diagnosis not present

## 2018-09-07 DIAGNOSIS — E039 Hypothyroidism, unspecified: Secondary | ICD-10-CM | POA: Insufficient documentation

## 2018-09-07 DIAGNOSIS — Z952 Presence of prosthetic heart valve: Secondary | ICD-10-CM | POA: Diagnosis not present

## 2018-09-07 DIAGNOSIS — Z79890 Hormone replacement therapy (postmenopausal): Secondary | ICD-10-CM | POA: Insufficient documentation

## 2018-09-07 DIAGNOSIS — I5022 Chronic systolic (congestive) heart failure: Secondary | ICD-10-CM

## 2018-09-07 DIAGNOSIS — Z7982 Long term (current) use of aspirin: Secondary | ICD-10-CM | POA: Insufficient documentation

## 2018-09-07 DIAGNOSIS — I272 Pulmonary hypertension, unspecified: Secondary | ICD-10-CM | POA: Diagnosis not present

## 2018-09-07 DIAGNOSIS — I110 Hypertensive heart disease with heart failure: Secondary | ICD-10-CM | POA: Diagnosis present

## 2018-09-07 DIAGNOSIS — Z951 Presence of aortocoronary bypass graft: Secondary | ICD-10-CM | POA: Diagnosis not present

## 2018-09-07 DIAGNOSIS — Z87891 Personal history of nicotine dependence: Secondary | ICD-10-CM | POA: Insufficient documentation

## 2018-09-07 NOTE — Patient Instructions (Signed)
Continue weighing daily and call for an overnight weight gain of > 2 pounds or a weekly weight gain of >5 pounds. 

## 2018-09-07 NOTE — Progress Notes (Signed)
Patient ID: [name], female    DOB: August 06, 1935, 83 y.o.   MRN: [id number]  HPI  Ms [name] is a 83 y/o female with a history of CAD, hyperlipidemia, HTN, thyroid disease, atrial fibrillation, COPD, pulmonary HTN, GERD, anxiety, meniere's disease, former tobacco use and chronic heart failure.   Echo report from 08/24/18 reviewed and showed an EF of 35-40% which is much improved from previous EF. Echo report from 07/17/18 reviewed and showed an EF of 20-25%.  Cardiac catheterization done 10/28/14.   Admitted 07/17/18 due to rapid atrial fibrillation. Initially needed amiodarone drip and was cardioverted 07/20/18. Initially needed IV lasix due to acute on chronic HF and then transitioned to oral medications. Discharged after 5 days.   She presents today for a follow-up visit with a chief complaint of minimal fatigue upon moderate exertion. She describes this as chronic in nature having been present for several years. She has associated light-headedness and easy bruising along with this. She denies any difficulty sleeping, abdominal distention, palpitations, pedal edema, chest pain, shortness of breath, cough or weight gain. Having a repeat echo done 10/26/18.  Past Medical History:  Diagnosis Date  . Anxiety   . Arthritis    "hands" (11/10/2015)  . CAD s/p CABG x 1    a. 04/2015 LIMA to LAD  . Chronic combined systolic (congestive) and diastolic (congestive) heart failure ([place])    a. 01/2015 Echo: EF 50-55%, Gr2 DD (preop valve surgery); b. 08/2015 Echo: EF 30-35%, diff HK. Gr2 DD; c. 09/2016 Echo: EF 50-55%. Nl fxning TV ring. Mildly dil LA.  Marland Kitchen COPD (chronic obstructive pulmonary disease) ([place])   . Coronary artery disease   . GERD (gastroesophageal reflux disease)   . History of colon polyps   . History of mitral valve replacement    a. 04/2015 s/p 27 mm [facility] Mitral bovine bioprosthetic tissue valve  . Hyperlipidemia   . Hypertension   . Hypothyroidism   . Maze operation for AF w/  LAA clipping    a. 04/2015 Complete bilateral atrial lesion set using cryothermy and bipolar radiofrequency ablation with clipping of LA appendage  . Meniere disease   . Meniere's disease   . On home oxygen therapy    "2L at night" (11/10/2015)  . PAF (paroxysmal atrial fibrillation) ([place])   . Pneumonia ~ 2010  . PONV (postoperative nausea and vomiting)    Pt felt like eardrums were gonna pop  . Post-surgical complete heart block, symptomatic    a. 04/2015 s/p MDT [id number] [place]visa DR MRI DC PPM.  . Presence of permanent cardiac pacemaker   . Pulmonary hypertension ([place])   . Surgical wound, non healing - chest wall 11/10/2015  . Tricuspid Regurgitation s/p Repair    a. 04/2015 s/p 26 mm Edwards mc3 ring annuloplasty  . Wound infection 10/13/2015   Superficial sternal wound infection   Past Surgical History:  Procedure Laterality Date  . APPENDECTOMY    . APPLICATION OF A-CELL OF CHEST/ABDOMEN N/A 11/21/2015   Procedure: APPLICATION OF A-CELL OF CHEST/ABDOMEN;  Surgeon: Loel Lofty Dillingham, DO;  Location: [place];  Service: Plastics;  Laterality: N/A;  . APPLICATION OF A-CELL OF CHEST/ABDOMEN Right 12/21/2015   Procedure: APPLICATION OF A-CELL OF RIGHT CHEST;  Surgeon: Loel Lofty Dillingham, DO;  Location: [place];  Service: Plastics;  Laterality: Right;  . APPLICATION OF A-CELL OF CHEST/ABDOMEN Right 01/11/2016   Procedure: APPLICATION OF A-CELL OF RIGHT CHEST;  Surgeon: Loel Lofty Dillingham, DO;  Location:  [place] [facility];  Service: Plastics;  Laterality: Right;  . APPLICATION OF A-CELL OF CHEST/ABDOMEN Right 02/12/2016   Procedure: APPLICATION OF A-CELL TO RIGHT CHEST WOUND;  Surgeon: Loel Lofty Dillingham, DO;  Location: [place];  Service: Plastics;  Laterality: Right;  . APPLICATION OF WOUND VAC N/A 05/09/2015   Procedure: APPLICATION OF WOUND VAC;  Surgeon: Rexene Alberts, MD;  Location: [place];  Service: Thoracic;  Laterality: N/A;  . APPLICATION OF WOUND VAC N/A 11/13/2015   Procedure: APPLICATION OF WOUND VAC;   Surgeon: Rexene Alberts, MD;  Location: [place];  Service: Thoracic;  Laterality: N/A;  . APPLICATION OF WOUND VAC N/A 11/21/2015   Procedure: APPLICATION OF WOUND VAC;  Surgeon: Loel Lofty Dillingham, DO;  Location: [place];  Service: Plastics;  Laterality: N/A;  . APPLICATION OF WOUND VAC Right 12/21/2015   Procedure: APPLICATION OF WOUND VAC to right chest wall ;  Surgeon: Loel Lofty Dillingham, DO;  Location: [place];  Service: Plastics;  Laterality: Right;  . APPLICATION OF WOUND VAC Right 01/11/2016   Procedure: APPLICATION OF WOUND VAC RIGHT CHEST ;  Surgeon: Loel Lofty Dillingham, DO;  Location: [place];  Service: Plastics;  Laterality: Right;  . APPLICATION OF WOUND VAC Right 01/24/2016   Procedure: APPLICATION OF WOUND VAC RIGHT CHEST WALL;  Surgeon: Loel Lofty Dillingham, DO;  Location: [place];  Service: Plastics;  Laterality: Right;  . APPLICATION OF WOUND VAC Right 02/12/2016   Procedure: RE-APPLICATION OF WOUND VAC TO RIGHT CHEST WOUND;  Surgeon: Loel Lofty Dillingham, DO;  Location: [place];  Service: Plastics;  Laterality: Right;  . BREAST BIOPSY Left 11/25/06   neg  . BREAST BIOPSY Left 01/20/12   /clip-neg  . CARDIAC CATHETERIZATION  11/2013   [facility]  . CARDIAC CATHETERIZATION  10/2014   [facility]  . CARDIAC VALVE REPLACEMENT    . CARDIOVERSION N/A 07/20/2018   Procedure: CARDIOVERSION;  Surgeon: Wellington Hampshire, MD;  Location: [facility];  Service: Cardiovascular;  Laterality: N/A;  . CATARACT EXTRACTION W/ INTRAOCULAR LENS  IMPLANT, BILATERAL Bilateral 2014  . CLIPPING OF ATRIAL APPENDAGE N/A 04/25/2015   Procedure: CLIPPING OF ATRIAL APPENDAGE;  Surgeon: Rexene Alberts, MD;  Location: [place];  Service: Open Heart Surgery;  Laterality: N/A;  . COCHLEAR IMPLANT Left 2005?  . COLONOSCOPY WITH PROPOFOL N/A 01/20/2018   Procedure: COLONOSCOPY WITH PROPOFOL;  Surgeon: Toledo, Benay Pike, MD;  Location: [facility];  Service: Gastroenterology;  Laterality: N/A;  . CORONARY ANGIOPLASTY    . CORONARY ARTERY  BYPASS GRAFT N/A 04/25/2015   Procedure: CORONARY ARTERY BYPASS GRAFTING (CABG) x ONE, using left internal mammary artery;  Surgeon: Rexene Alberts, MD;  Location: [place];  Service: Open Heart Surgery;  Laterality: N/A;  . DILATION AND CURETTAGE OF UTERUS  "several before hysterectomy"  . EP IMPLANTABLE DEVICE N/A 05/02/2015   Procedure: Pacemaker Implant;  Surgeon: Thompson Grayer, MD;  Location: [place] [facility];  Service: Cardiovascular;  Laterality: N/A;  . ESOPHAGOGASTRODUODENOSCOPY (EGD) WITH PROPOFOL N/A 01/20/2018   Procedure: ESOPHAGOGASTRODUODENOSCOPY (EGD) WITH PROPOFOL;  Surgeon: Toledo, Benay Pike, MD;  Location: [facility];  Service: Gastroenterology;  Laterality: N/A;  . EYE SURGERY    . I&D EXTREMITY Right 12/06/2015   Procedure: IRRIGATION AND DEBRIDEMENT RIGHT CHEST WALL WITH ACELL PLACEMENT AND VAC;  Surgeon: Loel Lofty Dillingham, DO;  Location: [place];  Service: Plastics;  Laterality: Right;  . INCISION AND DRAINAGE OF WOUND N/A 11/21/2015   Procedure: IRRIGATION AND DEBRIDEMENT  WOUND;  Surgeon: Loel Lofty Dillingham, DO;  Location: [place];  Service: Plastics;  Laterality: N/A;  . INCISION AND DRAINAGE OF WOUND Right 12/21/2015   Procedure: IRRIGATION AND DEBRIDEMENT right chest wall WOUND;  Surgeon: Loel Lofty Dillingham, DO;  Location: [place];  Service: Plastics;  Laterality: Right;  right chest wall   . INCISION AND DRAINAGE OF WOUND Right 01/24/2016   Procedure: IRRIGATION AND DEBRIDEMENT RIGHT CHEST WALL WOUND;  Surgeon: Loel Lofty Dillingham, DO;  Location: [place];  Service: Plastics;  Laterality: Right;  . INCISION AND DRAINAGE OF WOUND Right 03/28/2016   Procedure: IRRIGATION AND DEBRIDEMENT RIGHT CHEST WALL WOUND WITH A Cell Placement;  Surgeon: Loel Lofty Dillingham, DO;  Location: [place];  Service: Plastics;  Laterality: Right;  . INCISION AND DRAINAGE OF WOUND Right 05/17/2016   Procedure: IRRIGATION AND DEBRIDEMENT OF RIGHT CHEST WOUND WITH A CELL PLACEMENT;  Surgeon: Loel Lofty  Dillingham, DO;  Location: [place];  Service: Plastics;  Laterality: Right;  . INSERT / REPLACE / REMOVE PACEMAKER    . IRRIGATION AND DEBRIDEMENT OF WOUND WITH SPLIT THICKNESS SKIN GRAFT Right 01/11/2016   Procedure: IRRIGATION AND DEBRIDEMENT OF RIGHT CHEST WOUND ;  Surgeon: Loel Lofty Dillingham, DO;  Location: [place];  Service: Plastics;  Laterality: Right;  . MASTECTOMY, PARTIAL Right 2002   positive  . MAZE N/A 04/25/2015   Procedure: MAZE;  Surgeon: Rexene Alberts, MD;  Location: [place];  Service: Open Heart Surgery;  Laterality: N/A;  . MITRAL VALVE REPAIR N/A 04/25/2015   Procedure: MITRAL VALVE  REPLACEMENT using a 27 mm Edwards Perimount Magna Mitral Ease Valve;  Surgeon: Rexene Alberts, MD;  Location: [place];  Service: Open Heart Surgery;  Laterality: N/A;  . SKIN SPLIT GRAFT Right 02/12/2016   Procedure: IRRIGATION AND DEBRIDEMENT RIGHT CHEST WOUND;  Surgeon: Loel Lofty Dillingham, DO;  Location: [place];  Service: Plastics;  Laterality: Right;  . STERNAL WIRES REMOVAL N/A 06/05/2015   Procedure: STERNAL WIRES REMOVAL;  Surgeon: Rexene Alberts, MD;  Location: [place];  Service: Thoracic;  Laterality: N/A;  . STERNAL WOUND DEBRIDEMENT N/A 05/09/2015   Procedure: STERNAL WOUND DEBRIDEMENT;  Surgeon: Rexene Alberts, MD;  Location: [place];  Service: Thoracic;  Laterality: N/A;  . STERNAL WOUND DEBRIDEMENT N/A 11/13/2015   Procedure: Excisional drainage of RIGHT Chest wall mass and breast mass ;  Surgeon: Rexene Alberts, MD;  Location: [place];  Service: Thoracic;  Laterality: N/A;  . TEE WITHOUT CARDIOVERSION N/A 04/25/2015   Procedure: TRANSESOPHAGEAL ECHOCARDIOGRAM (TEE);  Surgeon: Rexene Alberts, MD;  Location: [place];  Service: Open Heart Surgery;  Laterality: N/A;  . TONSILLECTOMY    . TRAM Right 11/18/2015   Procedure: VRAM Vertical Rectus Abdominus Muscle Flap;  Surgeon: Loel Lofty Dillingham, DO;  Location: [place];  Service: Plastics;  Laterality: Right;  RIght Back  . TRICUSPID VALVE REPLACEMENT N/A  04/25/2015   Procedure: TRICUSPID VALVE REPAIR;  Surgeon: Rexene Alberts, MD;  Location: [place];  Service: Open Heart Surgery;  Laterality: N/A;  . VAGINAL HYSTERECTOMY     Family History  Problem Relation Age of Onset  . Heart disease Father   . Heart disease Brother   . CVA Mother   . Heart attack Paternal Uncle   . Breast cancer Maternal Aunt   . Breast cancer Paternal Aunt    Social History   Tobacco Use  . Smoking status: Former Smoker    Packs/day:  1.50    Years: 40.00    Pack years: 60.00    Types: Cigarettes    Last attempt to quit: 04/20/1998    Years since quitting: 20.3  . Smokeless tobacco: Never Used  Substance Use Topics  . Alcohol use: Yes    Comment: 11/10/2015 "glass of wine a few times/year, if that"   Allergies  Allergen Reactions  . Ace Inhibitors Cough and Other (See Comments)  . Amoxicillin-Pot Clavulanate Swelling and Other (See Comments)    JOINTS, PAIN Joint pain   . Penicillins Swelling and Other (See Comments)    SWOLLEN JOINTS  Has patient had a PCN reaction causing immediate rash, facial/tongue/throat swelling, SOB or lightheadedness with hypotension: Yes Has patient had a PCN reaction causing severe rash involving mucus membranes or skin necrosis: No Has patient had a PCN reaction that required hospitalization No Has patient had a PCN reaction occurring within the last 10 years: No If all of the above answers are "NO", then may proceed with Cephalosporin use.  Swollen joints  . Nifedipine Other (See Comments)    UNSPECIFIED UNSPECIFIED   . Propranolol Other (See Comments)    UNSPECIFIED UNSPECIFIED   . Diazepam Other (See Comments)    Made her "hyper" Made her "hyper" Made her "hyper"   . Meperidine Other (See Comments)    Made her "hyper" Made her "hyper" Made her "hyper"   . Propoxyphene Other (See Comments)    Made her "hyper" Made her "hyper" Made her "hyper"    Prior to Admission medications   Medication Sig Start Date  End Date Taking? Authorizing Provider  acetaminophen (TYLENOL) 325 MG tablet Take 2 tablets (650 mg total) by mouth every 4 (four) hours as needed for mild pain (or Fever >/= 101). 05/11/15  Yes Barrett, Erin R, PA-C  AMBULATORY NON FORMULARY MEDICATION [facility] at Home Oxygen machine with tubing. DX: COPD DX Code: J44.9 01/15/17  Yes Wilhelmina Mcardle, MD  amiodarone (PACERONE) 200 MG tablet Take 1 tablet (200 mg total) by mouth daily. 08/03/18  Yes Dunn, Areta Haber, PA-C  aspirin EC 81 MG EC tablet Take 1 tablet (81 mg total) by mouth daily. 05/11/15  Yes Barrett, Erin R, PA-C  azelastine (ASTELIN) 0.1 % nasal spray Place 1 spray into both nostrils 2 (two) times daily as needed for rhinitis or allergies.    Yes [provider]  bisoprolol (ZEBETA) 10 MG tablet Take 1 tablet (10 mg total) by mouth daily. 08/03/18  Yes Dunn, Areta Haber, PA-C  enoxaparin (LOVENOX) 60 MG/0.6ML injection Inject 0.6 mLs (60 mg total) into the skin every 12 (twelve) hours. 07/22/18  Yes Wieting, Richard, MD  ferrous sulfate 325 (65 FE) MG tablet Take 325 mg by mouth daily with breakfast.   Yes [provider]  fluticasone (FLONASE) 50 MCG/ACT nasal spray Place 1 spray into both nostrils daily as needed for allergies.    Yes [provider]  furosemide (LASIX) 40 MG tablet Take 1 tablet (40 mg total) by mouth daily. 08/25/18  Yes Deboraha Sprang, MD  gabapentin (NEURONTIN) 100 MG capsule Take 200 mg by mouth at bedtime.  09/29/15  Yes [provider]  ipratropium-albuterol (DUONEB) 0.5-2.5 (3) MG/3ML SOLN Take 3 mLs by nebulization every 6 (six) hours as needed. DX:J44.9 Patient taking differently: Take 3 mLs by nebulization every 6 (six) hours as needed (for wheezing/shortness of breath).  10/28/17  Yes Kasa, Maretta Bees, MD  levothyroxine (SYNTHROID, LEVOTHROID) 88 MCG tablet Take 88  mcg by mouth daily before breakfast.   Yes [provider]  losartan (COZAAR) 25 MG tablet Take 1 tablet (25 mg total)  by mouth daily. 08/03/18  Yes Dunn, Areta Haber, PA-C  Multiple Vitamins-Minerals (PRESERVISION AREDS 2 PO) Take 2 tablets by mouth daily.   Yes [provider]  pantoprazole (PROTONIX) 40 MG tablet Take 40 mg by mouth 2 (two) times daily.    Yes [provider]  potassium chloride SA (K-DUR,KLOR-CON) 20 MEQ tablet Take 1 tablet (20 mEq total) by mouth daily. 08/03/18  Yes Dunn, Areta Haber, PA-C  simvastatin (ZOCOR) 10 MG tablet Take 1 tablet (10 mg total) by mouth at bedtime. 08/03/18 11/01/18 Yes Dunn, Areta Haber, PA-C  warfarin (COUMADIN) 3 MG tablet Take 1 tablet (3 mg total) by mouth one time only at 6 PM. 07/22/18  Yes Loletha Grayer, MD    Review of Systems  Constitutional: Positive for fatigue. Negative for appetite change.  HENT: Negative for congestion, postnasal drip and sore throat.   Eyes: Negative.   Respiratory: Negative for cough, chest tightness and shortness of breath.   Cardiovascular: Negative for chest pain, palpitations and leg swelling.  Gastrointestinal: Negative for abdominal distention and abdominal pain.  Endocrine: Negative.   Genitourinary: Negative.   Musculoskeletal: Negative for back pain and neck pain.  Skin: Negative.   Allergic/Immunologic: Negative.   Neurological: Positive for light-headedness. Negative for dizziness.  Hematological: Negative for adenopathy. Bruises/bleeds easily.  Psychiatric/Behavioral: Negative for dysphoric mood and sleep disturbance (wearing oxygen at 2 L at bedtime). The patient is not nervous/anxious.    Vitals:   09/07/18 1253  BP: 136/73  Pulse: 65  Resp: 18  SpO2: 93%  Weight: 128 lb 2 oz (58.1 kg)  Height: 5\' 1"  (1.549 m)   Wt Readings from Last 3 Encounters:  09/07/18 128 lb 2 oz (58.1 kg)  08/25/18 127 lb 8 oz (57.8 kg)  08/03/18 128 lb 4 oz (58.2 kg)   Lab Results  Component Value Date   CREATININE 0.80 07/21/2018   CREATININE 0.82 07/20/2018   CREATININE 0.88 07/18/2018    Physical Exam Vitals  signs and nursing note reviewed.  Constitutional:      Appearance: She is well-developed.  HENT:     Head: Normocephalic and atraumatic.     Right Ear: Decreased hearing noted.     Left Ear: Decreased hearing noted.  Neck:     Musculoskeletal: Normal range of motion and neck supple.     Vascular: No JVD.  Cardiovascular:     Rate and Rhythm: Normal rate and regular rhythm.  Pulmonary:     Effort: Pulmonary effort is normal. No respiratory distress.     Breath sounds: No wheezing or rales.  Abdominal:     General: There is no distension.     Palpations: Abdomen is soft.     Tenderness: There is no abdominal tenderness.  Musculoskeletal:        General: No tenderness.  Skin:    General: Skin is warm and dry.  Neurological:     Mental Status: She is alert and oriented to person, place, and time.  Psychiatric:        Behavior: Behavior normal.        Thought Content: Thought content normal.    Assessment & Plan:  1: Chronic heart failure with reduced ejection fraction- - NYHA class II - euvolemic today - weighing daily and reminded to call for an overnight weight gain of >2  pounds or a weekly weight gain of >5 pounds - not adding salt and has been reading food labels. Discussed the importance of closely following a 2000mg  sodium diet  - saw cardiology (Dunn) 08/03/18 - discussed changing her losartan to entresto but will hold off at this time as she's having a repeat echo done 10/26/18 - EF has been improving over the last couple of months - on target dose of bisoprolol - BNP 07/17/18 was 477.0 - she says that she's received her flu and shingles vaccines  2: Atrial fibrillation- - cardioverted 07/20/18 - on amiodarone, bisoprolol and warfarin - saw EP Caryl Comes) 08/25/18  3: HTN- - BP looks good today - saw PCP Raechel Ache) 07/28/18 - BMP 07/21/18 reviewed and showed sodium 142, potassium 3.7, creatinine 0.80 and GFR >60  Patient did not bring her medications nor a list. Each  medication was verbally reviewed with the patient and she was encouraged to bring the bottles to every visit to confirm accuracy of list.  Return in months or sooner for any questions/problems before then.

## 2018-09-09 ENCOUNTER — Encounter: Payer: Self-pay | Admitting: Family

## 2018-09-10 ENCOUNTER — Ambulatory Visit (INDEPENDENT_AMBULATORY_CARE_PROVIDER_SITE_OTHER): Payer: Medicare Other

## 2018-09-10 DIAGNOSIS — I495 Sick sinus syndrome: Secondary | ICD-10-CM

## 2018-09-10 DIAGNOSIS — I442 Atrioventricular block, complete: Secondary | ICD-10-CM

## 2018-09-10 LAB — CUP PACEART REMOTE DEVICE CHECK
Brady Statistic AP VP Percent: 93.65 %
Brady Statistic AP VS Percent: 0.01 %
Brady Statistic AS VP Percent: 6.34 %
Brady Statistic RA Percent Paced: 93.61 %
Brady Statistic RV Percent Paced: 99.96 %
Date Time Interrogation Session: 20200102154014
Implantable Lead Location: 753859
Implantable Lead Model: 5076
Implantable Pulse Generator Implant Date: 20160823
Lead Channel Impedance Value: 399 Ohm
Lead Channel Pacing Threshold Pulse Width: 0.4 ms
Lead Channel Sensing Intrinsic Amplitude: 9.75 mV
Lead Channel Sensing Intrinsic Amplitude: 9.75 mV
Lead Channel Setting Pacing Amplitude: 2.5 V
Lead Channel Setting Pacing Pulse Width: 0.4 ms
Lead Channel Setting Sensing Sensitivity: 2.8 mV
MDC IDC LEAD IMPLANT DT: 20160823
MDC IDC LEAD IMPLANT DT: 20160823
MDC IDC LEAD LOCATION: 753860
MDC IDC MSMT BATTERY REMAINING LONGEVITY: 70 mo
MDC IDC MSMT BATTERY VOLTAGE: 3 V
MDC IDC MSMT LEADCHNL RA IMPEDANCE VALUE: 342 Ohm
MDC IDC MSMT LEADCHNL RA IMPEDANCE VALUE: 418 Ohm
MDC IDC MSMT LEADCHNL RA PACING THRESHOLD AMPLITUDE: 0.625 V
MDC IDC MSMT LEADCHNL RA SENSING INTR AMPL: 1.625 mV
MDC IDC MSMT LEADCHNL RA SENSING INTR AMPL: 1.625 mV
MDC IDC MSMT LEADCHNL RV IMPEDANCE VALUE: 380 Ohm
MDC IDC MSMT LEADCHNL RV PACING THRESHOLD AMPLITUDE: 0.5 V
MDC IDC MSMT LEADCHNL RV PACING THRESHOLD PULSEWIDTH: 0.4 ms
MDC IDC SET LEADCHNL RA PACING AMPLITUDE: 2 V
MDC IDC STAT BRADY AS VS PERCENT: 0 %

## 2018-09-10 NOTE — Progress Notes (Signed)
Remote pacemaker transmission.   

## 2018-09-16 ENCOUNTER — Ambulatory Visit (INDEPENDENT_AMBULATORY_CARE_PROVIDER_SITE_OTHER): Payer: Medicare Other

## 2018-09-16 ENCOUNTER — Ambulatory Visit: Payer: Medicare Other | Admitting: Nurse Practitioner

## 2018-09-16 DIAGNOSIS — I340 Nonrheumatic mitral (valve) insufficiency: Secondary | ICD-10-CM

## 2018-09-16 DIAGNOSIS — Z5181 Encounter for therapeutic drug level monitoring: Secondary | ICD-10-CM

## 2018-09-16 DIAGNOSIS — Z953 Presence of xenogenic heart valve: Secondary | ICD-10-CM

## 2018-09-16 LAB — POCT INR: INR: 5.1 — AB (ref 2.0–3.0)

## 2018-09-16 NOTE — Patient Instructions (Signed)
Please skip coumadin tonight and tomorrow, then START NEW DOSAGE of 1.5 tablets every day EXCEPT 1 [place]. Recheck in 2 weeks.

## 2018-09-16 NOTE — Progress Notes (Signed)
Cardiology Office Note Date:  09/17/2018  Patient ID:  [name], [name] [birth year], MRN [id number] PCP:  Ezequiel Kayser, MD  Cardiologist:  Dr. Fletcher Anon, MD Electrophysiologist: Dr. Caryl Comes, MD    Chief Complaint: Follow up  History of Present Illness: [name] is a 84 y.o. female with history of CAD s/p CABG x 1 with a LIMA to LAD in 04/2015, PAF on Coumadin, mitral valve stenosis and tricuspid regurgitation s/p mitral valve replacement, tricuspid valve replacement, MAZE procedure, and left atrial appendage clipping in 02/3784 complicated by sternal wound infection and complete heart block requiring MDT PPM implantation, chronic combined CHF, COPD on nocturnal oxygen, pulmonary hypertension, anemia, Meniere's disease, HTN, HLD, and hypothyroidism who presents for follow-up of her CAD, CHF, and A. fib.  She was diagnosed with Afib and acute systolic CHF in 04/8501 with an initial EF of 35-40%. Cardiac cath revealed moderate proximal LAD disease with TEE revealing moderate tricuspid regurgitation, PAH, and moderate to severe MR. She underwent DCCV at that time. Follow up echo showed persistent severe MR despite improvement in LV function. She was subsequently referred to thoracic surgery and underwent mitral valve replacement with a bovine valve, tricuspid valve repair, MAZE procedure, left atrial appendage clipping, and CABG x 1 with a LIMA to LAD. Postoperative course was complicated by sternal wound infection and complete heart block requiring [employer] permanent pacemaker. She was subsequently discharged on amiodarone and Coumadin in the setting of PAF and amiodarone was eventually discontinued. She has been on chronic Coumadin since. In 08/2017, she was seen with complaints of increasing lower extremity swelling and abdominal girth. Follow-up echo again showed LV dysfunction with an EF of 30 to 35%.She was seen by EP and it was felt that LV dysfunction was in the context of RV apical pacing. That was  inactivated and she noted immediate improvement in symptoms.Follow-up echocardiogram in 09/2016 showed improvement in LV function with an EF of 50 to 55% and normal functioning valves.At some point amiodarone was discontinued in the absence of recurrent Afib. She was seen by EP in 05/2018 noting some exercise intolerance with symptoms improving following adjustments in her PPM. However, she was seen on 11/8 in the office noting a 3 week history of DOE and fatigue. She noted some tachy-palpitations and ankle edema. Echo performed that day showed an EF of 20-25%, mild concentric LVH, normal functioning prosthetic mitral valve, mildly dilated LA, moderately dilated RV with moderately reduced RVSF. She was noted to be in Afib with RVR with ventricular rates in the 120s bpm. She was sent to the ED and subsequently admitted, diuresed, and rate controlled. She was loaded with IV amiodarone and underwent successful DCCV on 11/11 to an AV paced rhythm.  She was seen in hospital follow-up on 08/03/2018 and was doing very well.  She reported she had not felt well for many months.  There were no symptoms concerning for recurrence of A. fib.  She underwent repeat echo on 08/24/2017 that showed an EF of 35 to 40%, diffuse hypokinesis, mild concentric LVH, bioprosthetic mitral valve was present and functioning normally with a mean gradient of 4 mmHg, left atrium is mildly dilated, right ventricular is mildly dilated with mildly reduced RV systolic function, PASP could not be accurately estimated, trivial pericardial effusion was noted.  Compared to echo from 1 month prior EF had improved from 20 to 25% to 35 to 40%.  She was most recently seen in follow up with EP on 08/25/2018 and continued  to feel well.   She comes in accompanied by her husband today.  She continues to feel well.  She states she feels better than she did when I last saw her in 07/2018.  No symptoms concerning for recurrence of A. fib.  Her energy continues to  improve though is not yet back to baseline.  She is now back on simvastatin and tolerating without issues.  She has been compliant with all medications.  No falls since she was last seen.  No BRBPR or melena.  Weight remains stable at home.  No lower extremity swelling, abdominal distention, orthopnea, PND, or early satiety.  She has significantly cut back on her salt intake.  No chest pain, shortness of breath, palpitations, dizziness, presyncope, or syncope.  She is scheduled to undergo repeat echo in mid February, 2020.  Last INR remain elevated at 5.1, in the setting of recent amiodarone usage, and is being followed closely with the Coumadin clinic.  No signs of bleeding as above.   Past Medical History:  Diagnosis Date  . Anxiety   . Arthritis    "hands" (11/10/2015)  . CAD s/p CABG x 1    a. 04/2015 LIMA to LAD  . Chronic combined systolic (congestive) and diastolic (congestive) heart failure ([place])    a. 01/2015 Echo: EF 50-55%, Gr2 DD (preop valve surgery); b. 08/2015 Echo: EF 30-35%, diff HK. Gr2 DD; c. 09/2016 Echo: EF 50-55%. Nl fxning TV ring. Mildly dil LA.  Marland Kitchen COPD (chronic obstructive pulmonary disease) ([place])   . Coronary artery disease   . GERD (gastroesophageal reflux disease)   . History of colon polyps   . History of mitral valve replacement    a. 04/2015 s/p 27 mm [facility] Mitral bovine bioprosthetic tissue valve  . Hyperlipidemia   . Hypertension   . Hypothyroidism   . 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 04/2015 Complete bilateral atrial lesion set using cryothermy and bipolar radiofrequency ablation with clipping of LA appendage  . Meniere disease   . Meniere's disease   . On home oxygen therapy    "2L at night" (11/10/2015)  . PAF (paroxysmal atrial fibrillation) ([place])   . Pneumonia ~ 2010  . PONV (postoperative nausea and vomiting)    Pt felt like eardrums were gonna pop  . Post-surgical complete heart block, symptomatic    a. 04/2015 s/p MDT [id number] [address]visa DR  MRI DC PPM.  . Presence of permanent cardiac pacemaker   . Pulmonary hypertension ([place])   . Surgical wound, non healing - chest wall 11/10/2015  . Tricuspid Regurgitation s/p Repair    a. 04/2015 s/p 26 mm Edwards mc3 ring annuloplasty  . Wound infection 10/13/2015   Superficial sternal wound infection    Past Surgical History:  Procedure Laterality Date  . APPENDECTOMY    . APPLICATION OF A-CELL OF CHEST/ABDOMEN N/A 11/21/2015   Procedure: APPLICATION OF A-CELL OF CHEST/ABDOMEN;  Surgeon: Loel Lofty Dillingham, DO;  Location: [place];  Service: Plastics;  Laterality: N/A;  . APPLICATION OF A-CELL OF CHEST/ABDOMEN Right 12/21/2015   Procedure: APPLICATION OF A-CELL OF RIGHT CHEST;  Surgeon: Loel Lofty Dillingham, DO;  Location: [place];  Service: Plastics;  Laterality: Right;  . APPLICATION OF A-CELL OF CHEST/ABDOMEN Right 01/11/2016   Procedure: APPLICATION OF A-CELL OF RIGHT CHEST;  Surgeon: Loel Lofty Dillingham, DO;  Location: [place];  Service: Plastics;  Laterality: Right;  . APPLICATION OF A-CELL OF CHEST/ABDOMEN Right 02/12/2016  Procedure: APPLICATION OF A-CELL TO RIGHT CHEST WOUND;  Surgeon: Loel Lofty Dillingham, DO;  Location: [place];  Service: Plastics;  Laterality: Right;  . APPLICATION OF WOUND VAC N/A 05/09/2015   Procedure: APPLICATION OF WOUND VAC;  Surgeon: Rexene Alberts, MD;  Location: [place];  Service: Thoracic;  Laterality: N/A;  . APPLICATION OF WOUND VAC N/A 11/13/2015   Procedure: APPLICATION OF WOUND VAC;  Surgeon: Rexene Alberts, MD;  Location: [place];  Service: Thoracic;  Laterality: N/A;  . APPLICATION OF WOUND VAC N/A 11/21/2015   Procedure: APPLICATION OF WOUND VAC;  Surgeon: Loel Lofty Dillingham, DO;  Location: [place];  Service: Plastics;  Laterality: N/A;  . APPLICATION OF WOUND VAC Right 12/21/2015   Procedure: APPLICATION OF WOUND VAC to right chest wall ;  Surgeon: Loel Lofty Dillingham, DO;  Location: [place];  Service: Plastics;  Laterality: Right;  . APPLICATION OF WOUND VAC Right  01/11/2016   Procedure: APPLICATION OF WOUND VAC RIGHT CHEST ;  Surgeon: Loel Lofty Dillingham, DO;  Location: [place];  Service: Plastics;  Laterality: Right;  . APPLICATION OF WOUND VAC Right 01/24/2016   Procedure: APPLICATION OF WOUND VAC RIGHT CHEST WALL;  Surgeon: Loel Lofty Dillingham, DO;  Location: [place];  Service: Plastics;  Laterality: Right;  . APPLICATION OF WOUND VAC Right 02/12/2016   Procedure: RE-APPLICATION OF WOUND VAC TO RIGHT CHEST WOUND;  Surgeon: Loel Lofty Dillingham, DO;  Location: [place];  Service: Plastics;  Laterality: Right;  . BREAST BIOPSY Left 11/25/06   neg  . BREAST BIOPSY Left 01/20/12   /clip-neg  . CARDIAC CATHETERIZATION  11/2013   [facility]  . CARDIAC CATHETERIZATION  10/2014   [facility]  . CARDIAC VALVE REPLACEMENT    . CARDIOVERSION N/A 07/20/2018   Procedure: CARDIOVERSION;  Surgeon: Wellington Hampshire, MD;  Location: [facility];  Service: Cardiovascular;  Laterality: N/A;  . CATARACT EXTRACTION W/ INTRAOCULAR LENS  IMPLANT, BILATERAL Bilateral 2014  . CLIPPING OF ATRIAL APPENDAGE N/A 04/25/2015   Procedure: CLIPPING OF ATRIAL APPENDAGE;  Surgeon: Rexene Alberts, MD;  Location: [place];  Service: Open Heart Surgery;  Laterality: N/A;  . COCHLEAR IMPLANT Left 2005?  . COLONOSCOPY WITH PROPOFOL N/A 01/20/2018   Procedure: COLONOSCOPY WITH PROPOFOL;  Surgeon: Toledo, Benay Pike, MD;  Location: [facility];  Service: Gastroenterology;  Laterality: N/A;  . CORONARY ANGIOPLASTY    . CORONARY ARTERY BYPASS GRAFT N/A 04/25/2015   Procedure: CORONARY ARTERY BYPASS GRAFTING (CABG) x ONE, using left internal mammary artery;  Surgeon: Rexene Alberts, MD;  Location: [place];  Service: Open Heart Surgery;  Laterality: N/A;  . DILATION AND CURETTAGE OF UTERUS  "several before hysterectomy"  . EP IMPLANTABLE DEVICE N/A 05/02/2015   Procedure: Pacemaker Implant;  Surgeon: Thompson Grayer, MD;  Location: [place] [facility];  Service: Cardiovascular;  Laterality: N/A;  . ESOPHAGOGASTRODUODENOSCOPY  (EGD) WITH PROPOFOL N/A 01/20/2018   Procedure: ESOPHAGOGASTRODUODENOSCOPY (EGD) WITH PROPOFOL;  Surgeon: Toledo, Benay Pike, MD;  Location: [facility];  Service: Gastroenterology;  Laterality: N/A;  . EYE SURGERY    . I&D EXTREMITY Right 12/06/2015   Procedure: IRRIGATION AND DEBRIDEMENT RIGHT CHEST WALL WITH ACELL PLACEMENT AND VAC;  Surgeon: Loel Lofty Dillingham, DO;  Location: [place];  Service: Plastics;  Laterality: Right;  . INCISION AND DRAINAGE OF WOUND N/A 11/21/2015   Procedure: IRRIGATION AND DEBRIDEMENT WOUND;  Surgeon: Loel Lofty Dillingham, DO;  Location: [place];  Service: Plastics;  Laterality: N/A;  .  INCISION AND DRAINAGE OF WOUND Right 12/21/2015   Procedure: IRRIGATION AND DEBRIDEMENT right chest wall WOUND;  Surgeon: Loel Lofty Dillingham, DO;  Location: [place];  Service: Plastics;  Laterality: Right;  right chest wall   . INCISION AND DRAINAGE OF WOUND Right 01/24/2016   Procedure: IRRIGATION AND DEBRIDEMENT RIGHT CHEST WALL WOUND;  Surgeon: Loel Lofty Dillingham, DO;  Location: [place];  Service: Plastics;  Laterality: Right;  . INCISION AND DRAINAGE OF WOUND Right 03/28/2016   Procedure: IRRIGATION AND DEBRIDEMENT RIGHT CHEST WALL WOUND WITH A Cell Placement;  Surgeon: Loel Lofty Dillingham, DO;  Location: [place];  Service: Plastics;  Laterality: Right;  . INCISION AND DRAINAGE OF WOUND Right 05/17/2016   Procedure: IRRIGATION AND DEBRIDEMENT OF RIGHT CHEST WOUND WITH A CELL PLACEMENT;  Surgeon: Loel Lofty Dillingham, DO;  Location: [place];  Service: Plastics;  Laterality: Right;  . INSERT / REPLACE / REMOVE PACEMAKER    . IRRIGATION AND DEBRIDEMENT OF WOUND WITH SPLIT THICKNESS SKIN GRAFT Right 01/11/2016   Procedure: IRRIGATION AND DEBRIDEMENT OF RIGHT CHEST WOUND ;  Surgeon: Loel Lofty Dillingham, DO;  Location: [place];  Service: Plastics;  Laterality: Right;  . MASTECTOMY, PARTIAL Right 2002   positive  . MAZE N/A 04/25/2015   Procedure: MAZE;  Surgeon: Rexene Alberts, MD;  Location: [place];   Service: Open Heart Surgery;  Laterality: N/A;  . MITRAL VALVE REPAIR N/A 04/25/2015   Procedure: MITRAL VALVE  REPLACEMENT using a 27 mm Edwards Perimount Magna Mitral Ease Valve;  Surgeon: Rexene Alberts, MD;  Location: [place];  Service: Open Heart Surgery;  Laterality: N/A;  . SKIN SPLIT GRAFT Right 02/12/2016   Procedure: IRRIGATION AND DEBRIDEMENT RIGHT CHEST WOUND;  Surgeon: Loel Lofty Dillingham, DO;  Location: [place];  Service: Plastics;  Laterality: Right;  . STERNAL WIRES REMOVAL N/A 06/05/2015   Procedure: STERNAL WIRES REMOVAL;  Surgeon: Rexene Alberts, MD;  Location: [place];  Service: Thoracic;  Laterality: N/A;  . STERNAL WOUND DEBRIDEMENT N/A 05/09/2015   Procedure: STERNAL WOUND DEBRIDEMENT;  Surgeon: Rexene Alberts, MD;  Location: [place];  Service: Thoracic;  Laterality: N/A;  . STERNAL WOUND DEBRIDEMENT N/A 11/13/2015   Procedure: Excisional drainage of RIGHT Chest wall mass and breast mass ;  Surgeon: Rexene Alberts, MD;  Location: [place];  Service: Thoracic;  Laterality: N/A;  . TEE WITHOUT CARDIOVERSION N/A 04/25/2015   Procedure: TRANSESOPHAGEAL ECHOCARDIOGRAM (TEE);  Surgeon: Rexene Alberts, MD;  Location: [place];  Service: Open Heart Surgery;  Laterality: N/A;  . TONSILLECTOMY    . TRAM Right 11/18/2015   Procedure: VRAM Vertical Rectus Abdominus Muscle Flap;  Surgeon: Loel Lofty Dillingham, DO;  Location: [place];  Service: Plastics;  Laterality: Right;  RIght Back  . TRICUSPID VALVE REPLACEMENT N/A 04/25/2015   Procedure: TRICUSPID VALVE REPAIR;  Surgeon: Rexene Alberts, MD;  Location: [place];  Service: Open Heart Surgery;  Laterality: N/A;  . VAGINAL HYSTERECTOMY      Current Meds  Medication Sig  . acetaminophen (TYLENOL) 325 MG tablet Take 2 tablets (650 mg total) by mouth every 4 (four) hours as needed for mild pain (or Fever >/= 101).  . AMBULATORY NON FORMULARY MEDICATION Inogen at Home Oxygen machine with tubing. DX: COPD DX Code: J44.9  . amiodarone (PACERONE) 200 MG tablet  Take 1 tablet (200 mg total) by mouth daily.  Marland Kitchen aspirin EC 81 MG EC tablet Take 1 tablet (81 mg total) by  mouth daily.  Marland Kitchen azelastine (ASTELIN) 0.1 % nasal spray Place 1 spray into both nostrils 2 (two) times daily as needed for rhinitis or allergies.   . bisoprolol (ZEBETA) 10 MG tablet Take 1 tablet (10 mg total) by mouth daily.  . ferrous sulfate 325 (65 FE) MG tablet Take 325 mg by mouth daily with breakfast.  . fluticasone (FLONASE) 50 MCG/ACT nasal spray Place 1 spray into both nostrils daily as needed for allergies.   . furosemide (LASIX) 40 MG tablet Take 1 tablet (40 mg total) by mouth daily.  Marland Kitchen gabapentin (NEURONTIN) 100 MG capsule Take 200 mg by mouth at bedtime.   Marland Kitchen ipratropium-albuterol (DUONEB) 0.5-2.5 (3) MG/3ML SOLN Take 3 mLs by nebulization every 6 (six) hours as needed. DX:J44.9 (Patient taking differently: Take 3 mLs by nebulization every 6 (six) hours as needed (for wheezing/shortness of breath). )  . levothyroxine (SYNTHROID, LEVOTHROID) 88 MCG tablet Take 88 mcg by mouth daily before breakfast.  . losartan (COZAAR) 25 MG tablet Take 1 tablet (25 mg total) by mouth daily.  . Multiple Vitamins-Minerals (PRESERVISION AREDS 2 PO) Take 2 tablets by mouth daily.  . pantoprazole (PROTONIX) 40 MG tablet Take 40 mg by mouth 2 (two) times daily.   . potassium chloride SA (K-DUR,KLOR-CON) 20 MEQ tablet Take 1 tablet (20 mEq total) by mouth daily.  . simvastatin (ZOCOR) 10 MG tablet Take 1 tablet (10 mg total) by mouth at bedtime.  Marland Kitchen warfarin (COUMADIN) 3 MG tablet Take 1 tablet (3 mg total) by mouth one time only at 6 PM.    Allergies:   Ace inhibitors; Amoxicillin-pot clavulanate; Penicillins; Nifedipine; Propranolol; Diazepam; Meperidine; and Propoxyphene   Social History:  The patient  reports that she quit smoking about 20 years ago. Her smoking use included cigarettes. She has a 60.00 pack-year smoking history. She has never used smokeless tobacco. She reports current alcohol use.  She reports that she does not use drugs.   Family History:  The patient's family history includes Breast cancer in her maternal aunt and paternal aunt; CVA in her mother; Heart attack in her paternal uncle; Heart disease in her brother and father.  ROS:   Review of Systems  Constitutional: Positive for malaise/fatigue. Negative for chills, diaphoresis, fever and weight loss.  HENT: Negative for congestion.   Eyes: Negative for discharge and redness.  Respiratory: Negative for cough, hemoptysis, sputum production, shortness of breath and wheezing.   Cardiovascular: Negative for chest pain, palpitations, orthopnea, claudication, leg swelling and PND.  Gastrointestinal: Negative for abdominal pain, blood in stool, heartburn, melena, nausea and vomiting.  Genitourinary: Negative for hematuria.  Musculoskeletal: Negative for falls and myalgias.  Skin: Negative for rash.  Neurological: Positive for weakness. Negative for dizziness, tingling, tremors, sensory change, speech change, focal weakness and loss of consciousness.  Endo/Heme/Allergies: Does not bruise/bleed easily.  Psychiatric/Behavioral: Negative for substance abuse. The patient is not nervous/anxious.   All other systems reviewed and are negative.    PHYSICAL EXAM:  VS:  BP 130/60 (BP Location: Left Arm, Patient Position: Sitting, Cuff Size: Normal)   Pulse 65   Ht 5\' 1"  (1.549 m)   Wt 129 lb (58.5 kg)   BMI 24.37 kg/m  BMI: Body mass index is 24.37 kg/m.  Physical Exam  Constitutional: She is oriented to person, place, and time. She appears well-developed and well-nourished.  HENT:  Head: Normocephalic and atraumatic.  Eyes: Right eye exhibits no discharge. Left eye exhibits no discharge.  Neck: Normal range  of motion. No JVD present.  Cardiovascular: Normal rate, regular rhythm, S1 normal, S2 normal and normal heart sounds. Exam reveals no distant heart sounds, no friction rub, no midsystolic click and no opening snap.  No  murmur heard. Pulses:      Posterior tibial pulses are 2+ on the right side and 2+ on the left side.  Pulmonary/Chest: Effort normal and breath sounds normal. No respiratory distress. She has no decreased breath sounds. She has no wheezes. She has no rales. She exhibits no tenderness.  Abdominal: Soft. She exhibits no distension. There is no abdominal tenderness.  Musculoskeletal:        General: No edema.  Neurological: She is alert and oriented to person, place, and time.  Skin: Skin is warm and dry. No cyanosis. Nails show no clubbing.  Psychiatric: She has a normal mood and affect. Her speech is normal and behavior is normal. Judgment and thought content normal.     EKG:  Was ordered today, patient declined.   Recent Labs: 05/13/2018: Magnesium 2.2 07/17/2018: B Natriuretic Peptide 477.0 07/20/2018: TSH 2.168 07/21/2018: BUN 21; Creatinine, Ser 0.80; Sodium 142 07/22/2018: Hemoglobin 9.9; Platelets 225; Potassium 3.9 08/25/2018: ALT 20  No results found for requested labs within last 8760 hours.   CrCl cannot be calculated (Patient's most recent lab result is older than the maximum 21 days allowed.).   Wt Readings from Last 3 Encounters:  09/17/18 129 lb (58.5 kg)  09/07/18 128 lb 2 oz (58.1 kg)  08/25/18 127 lb 8 oz (57.8 kg)     Other studies reviewed: Additional studies/records reviewed today include: summarized above  ASSESSMENT AND PLAN:  1. CAD involving the native coronary arteries status post CABG without angina: She is doing well without any symptom concerning for angina at this time.  She remains on aspirin and bisoprolol.  She is tolerating the resumption of simvastatin without issues.  Aggressive secondary prevention.  No plans for repeat ischemic evaluation at this time.  2. PAF: Appears to be maintaining sinus rhythm on physical exam.  She declined EKG today.  Continue bisoprolol and amiodarone.  Recent TSH and liver function were normal.  Remains on Coumadin  with most recent INR supratherapeutic as outlined above.  She is being monitored closely by the Coumadin clinic with repeat INR to be later this month.  Check CBC today given a supratherapeutic INR.  3. Chronic combined CHF secondary to mixed ICM and NICM: She does not appear grossly volume overloaded at this time.  Continue bisoprolol and losartan.  Remains on Lasix 40 mg daily.  Add Spironolactone 12.5 mg daily.  With the addition of spironolactone, I have discontinued her KCl.  She was previously on spironolactone though this was discontinued in the setting of relative hypotension with subsequent normalization in her EF.  She is scheduled for repeat echocardiogram in mid February, 2020 to evaluate for improvement in her EF.  If her EF remains reduced EP is considering CRT.  Continue to optimize evidence-based, heart failure therapy.  CHF education.  Check BMP today and again in 1 week following initiation of spironolactone.  4. Valvular heart disease: Normal functioning mitral valve replacement and tricuspid valve repair by recent echo.  Continue to monitor with periodic echo.  Remains on Coumadin and aspirin as outlined above.  5. Complete heart block: Status post MDT PPM.  Followed by EP.  6. Pulmonary hypertension: As above.  7. HTN: Blood pressure is reasonably controlled.  Continue current medications as above with  the addition of spironolactone.  8. HLD: LDL of 56 from 07/2018.  Tolerating lower dose of simvastatin.  Disposition: F/u with Dr. Caryl Comes after repeat echo in 10/2018 and with Dr. Fletcher Anon in 6 months.  Current medicines are reviewed at length with the patient today.  The patient did not have any concerns regarding medicines.  Melvern Banker PA-C 09/17/2018 2:05 PM     [address] [place], [place] [address] [phone]

## 2018-09-17 ENCOUNTER — Encounter: Payer: Self-pay | Admitting: Physician Assistant

## 2018-09-17 ENCOUNTER — Ambulatory Visit (INDEPENDENT_AMBULATORY_CARE_PROVIDER_SITE_OTHER): Payer: Medicare Other | Admitting: Physician Assistant

## 2018-09-17 VITALS — BP 130/60 | HR 65 | Ht 61.0 in | Wt 129.0 lb

## 2018-09-17 DIAGNOSIS — I272 Pulmonary hypertension, unspecified: Secondary | ICD-10-CM

## 2018-09-17 DIAGNOSIS — I5042 Chronic combined systolic (congestive) and diastolic (congestive) heart failure: Secondary | ICD-10-CM | POA: Diagnosis not present

## 2018-09-17 DIAGNOSIS — I2510 Atherosclerotic heart disease of native coronary artery without angina pectoris: Secondary | ICD-10-CM

## 2018-09-17 DIAGNOSIS — I480 Paroxysmal atrial fibrillation: Secondary | ICD-10-CM

## 2018-09-17 DIAGNOSIS — E785 Hyperlipidemia, unspecified: Secondary | ICD-10-CM

## 2018-09-17 DIAGNOSIS — I5022 Chronic systolic (congestive) heart failure: Secondary | ICD-10-CM | POA: Diagnosis not present

## 2018-09-17 DIAGNOSIS — I38 Endocarditis, valve unspecified: Secondary | ICD-10-CM

## 2018-09-17 DIAGNOSIS — Z950 Presence of cardiac pacemaker: Secondary | ICD-10-CM

## 2018-09-17 DIAGNOSIS — I442 Atrioventricular block, complete: Secondary | ICD-10-CM

## 2018-09-17 DIAGNOSIS — I10 Essential (primary) hypertension: Secondary | ICD-10-CM

## 2018-09-17 MED ORDER — SPIRONOLACTONE 25 MG PO TABS: 12.5000 mg | ORAL_TABLET | Freq: Every day | ORAL | 3 refills | Status: DC

## 2018-09-17 NOTE — Patient Instructions (Signed)
Medication Instructions:  Your physician has recommended you make the following change in your medication:  1- STOP Potassium Chloride 2- START Spironolactone Take 0.5 tablets (12.5 mg total) by mouth daily  If you need a refill on your cardiac medications before your next appointment, please call your pharmacy.   Lab work: Your physician recommends that you return for lab work in: 1- Today (BMET, CBC) 2- Return to clinic in 1 week for repeat BMET  If you have labs (blood work) drawn today and your tests are completely normal, you will receive your results only by: Marland Kitchen MyChart Message (if you have MyChart) OR . A paper copy in the mail If you have any lab test that is abnormal or we need to change your treatment, we will call you to review the results.  Testing/Procedures: Keep your previously scheduled appointment for Echo  Follow-Up: At [facility], you and your health needs are our priority.  As part of our continuing mission to provide you with exceptional heart care, we have created designated Provider Care Teams.  These Care Teams include your primary Cardiologist (physician) and Advanced Practice Providers (APPs -  Physician Assistants and Nurse Practitioners) who all work together to provide you with the care you need, when you need it. You will need a follow up appointment in 6 months.  Please call our office 2 months in advance to schedule this appointment.  You may see Kathlyn Sacramento, MD or one of the following Advanced Practice Providers on your designated Care Team:   Murray Hodgkins, NP Christell Faith, PA-C . Marrianne Mood, PA-C  Any Other Special Instructions Will Be Listed Below (If Applicable). _ Appointment needed with Dr. Caryl Comes following Echo

## 2018-09-18 ENCOUNTER — Ambulatory Visit: Payer: Medicare Other | Admitting: Cardiovascular Disease

## 2018-09-18 ENCOUNTER — Telehealth: Payer: Self-pay | Admitting: Physician Assistant

## 2018-09-18 LAB — CBC
HEMATOCRIT: 33.9 % — AB (ref 34.0–46.6)
HEMOGLOBIN: 11.1 g/dL (ref 11.1–15.9)
MCH: 32.2 pg (ref 26.6–33.0)
MCHC: 32.7 g/dL (ref 31.5–35.7)
MCV: 98 fL — AB (ref 79–97)
Platelets: 204 10*3/uL (ref 150–450)
RBC: 3.45 x10E6/uL — ABNORMAL LOW (ref 3.77–5.28)
RDW: 16.5 % — AB (ref 11.7–15.4)
WBC: 6.2 10*3/uL (ref 3.4–10.8)

## 2018-09-18 LAB — BASIC METABOLIC PANEL
BUN/Creatinine Ratio: 20 (ref 12–28)
BUN: 20 mg/dL (ref 8–27)
CO2: 27 mmol/L (ref 20–29)
Calcium: 9.5 mg/dL (ref 8.7–10.3)
Chloride: 102 mmol/L (ref 96–106)
Creatinine, Ser: 0.99 mg/dL (ref 0.57–1.00)
GFR calc Af Amer: 62 mL/min/{1.73_m2} (ref >59)
GFR calc non Af Amer: 54 mL/min/{1.73_m2} — ABNORMAL LOW (ref >59)
Glucose: 134 mg/dL — ABNORMAL HIGH (ref 65–99)
Potassium: 4.9 mmol/L (ref 3.5–5.2)
Sodium: 145 mmol/L — ABNORMAL HIGH (ref 134–144)

## 2018-09-18 MED ORDER — FUROSEMIDE 40 MG PO TABS: 20.0000 mg | ORAL_TABLET | Freq: Every day | ORAL | Status: DC

## 2018-09-18 NOTE — Telephone Encounter (Signed)
Notes recorded by Rise Mu, PA-C on 09/18/2018 at 10:11 AM EST Random glucose is mildly elevated.  Renal function is normal, though trending up. Potassium is on the high-end of normal.  HGB is improved.   Given her potassium is high-end of normal, I'd like for her to hold off on continuing with the start of spironolactone. I would still like for her to hold KCl and salt substitutes like we discussed at her office visit.  Please decrease Lasix to 20 mg daily.  I'd like to recheck her BMET on Monday, 09/21/2018.  If renal function and potassium have trended down with the above changes over the weekend, we will resume planned initiation of spironolactone at that time.

## 2018-09-18 NOTE — Telephone Encounter (Signed)
I spoke with the patient regarding her lab results. She is aware of Thurmond Butts PA's recommendations to:  1) hold spironolactone 2) continue to hold potassium/ any salt substitues 3) Decrease lasix 40 mg- take 1/2 tablet (20 mg) once daily 4) repeat her BMP on Monday 09/21/18  The patient voices understanding of the above. She states she did take a 1/2 tablet (12.5 mg) of aldactone this morning. I have advised her not to take any more. She will come to the office on Monday 1/13 at 10:00 am for a repeat BMP.  Appt made and orders placed.   The patient verbalizes understanding of the above recommendations.

## 2018-09-21 ENCOUNTER — Other Ambulatory Visit (INDEPENDENT_AMBULATORY_CARE_PROVIDER_SITE_OTHER): Payer: Medicare Other

## 2018-09-21 DIAGNOSIS — I5042 Chronic combined systolic (congestive) and diastolic (congestive) heart failure: Secondary | ICD-10-CM | POA: Diagnosis not present

## 2018-09-22 ENCOUNTER — Telehealth: Payer: Self-pay | Admitting: Physician Assistant

## 2018-09-22 DIAGNOSIS — I5042 Chronic combined systolic (congestive) and diastolic (congestive) heart failure: Secondary | ICD-10-CM

## 2018-09-22 DIAGNOSIS — Z79899 Other long term (current) drug therapy: Secondary | ICD-10-CM

## 2018-09-22 LAB — BASIC METABOLIC PANEL
BUN / CREAT RATIO: 23 (ref 12–28)
BUN: 21 mg/dL (ref 8–27)
CO2: 28 mmol/L (ref 20–29)
Calcium: 9.4 mg/dL (ref 8.7–10.3)
Chloride: 102 mmol/L (ref 96–106)
Creatinine, Ser: 0.92 mg/dL (ref 0.57–1.00)
GFR, EST AFRICAN AMERICAN: 68 mL/min/{1.73_m2} (ref >59)
GFR, EST NON AFRICAN AMERICAN: 59 mL/min/{1.73_m2} — AB (ref >59)
Glucose: 95 mg/dL (ref 65–99)
POTASSIUM: 4.4 mmol/L (ref 3.5–5.2)
Sodium: 149 mmol/L — ABNORMAL HIGH (ref 134–144)

## 2018-09-22 NOTE — Telephone Encounter (Signed)
I called and spoke with the patient. She is aware of her BMP results and Christell Faith, PA's  recommendations to start spironolactone 25 mg- take 1/2 tablet (12.5 mg) once daily. She is also aware to avoid any potassium supplements/ salt substitutes or seasonings that are high in potassium.  The patient is due for a follow up INR on 09/30/18. I advised her we will check her BMP at that time.   The patient verbalizes understanding of the above and is agreeable.

## 2018-09-22 NOTE — Telephone Encounter (Signed)
Notes recorded by Rise Mu, PA-C on 09/22/2018 at 7:02 AM EST Renal function and potassium have trended down. Please start spironolactone 12.5 mg daily. Please ensure she has discontinued KCl and seasonings that are high in potassium. Recheck bmet in 1 week to ensure stable labs on spironolactone.

## 2018-09-24 ENCOUNTER — Other Ambulatory Visit: Payer: Medicare Other

## 2018-09-30 ENCOUNTER — Ambulatory Visit (INDEPENDENT_AMBULATORY_CARE_PROVIDER_SITE_OTHER): Payer: Medicare Other

## 2018-09-30 ENCOUNTER — Other Ambulatory Visit (INDEPENDENT_AMBULATORY_CARE_PROVIDER_SITE_OTHER): Payer: Medicare Other

## 2018-09-30 DIAGNOSIS — Z5181 Encounter for therapeutic drug level monitoring: Secondary | ICD-10-CM | POA: Diagnosis not present

## 2018-09-30 DIAGNOSIS — Z953 Presence of xenogenic heart valve: Secondary | ICD-10-CM

## 2018-09-30 DIAGNOSIS — I5042 Chronic combined systolic (congestive) and diastolic (congestive) heart failure: Secondary | ICD-10-CM

## 2018-09-30 DIAGNOSIS — I340 Nonrheumatic mitral (valve) insufficiency: Secondary | ICD-10-CM | POA: Diagnosis not present

## 2018-09-30 DIAGNOSIS — Z79899 Other long term (current) drug therapy: Secondary | ICD-10-CM

## 2018-09-30 LAB — POCT INR: INR: 3.6 — AB (ref 2.0–3.0)

## 2018-09-30 NOTE — Patient Instructions (Signed)
Please skip coumadin tonight, then continue dosage of 1.5 tablets every day EXCEPT 1 [place]. Recheck in 2 weeks.

## 2018-10-01 ENCOUNTER — Other Ambulatory Visit: Payer: Self-pay | Admitting: Cardiovascular Disease

## 2018-10-01 LAB — BASIC METABOLIC PANEL
BUN/Creatinine Ratio: 23 (ref 12–28)
BUN: 18 mg/dL (ref 8–27)
CALCIUM: 9.5 mg/dL (ref 8.7–10.3)
CO2: 28 mmol/L (ref 20–29)
CREATININE: 0.79 mg/dL (ref 0.57–1.00)
Chloride: 98 mmol/L (ref 96–106)
GFR calc Af Amer: 81 mL/min/{1.73_m2} (ref >59)
GFR, EST NON AFRICAN AMERICAN: 70 mL/min/{1.73_m2} (ref >59)
Glucose: 135 mg/dL — ABNORMAL HIGH (ref 65–99)
Potassium: 4.4 mmol/L (ref 3.5–5.2)
Sodium: 145 mmol/L — ABNORMAL HIGH (ref 134–144)

## 2018-10-01 NOTE — Telephone Encounter (Signed)
Please review for refill.  

## 2018-10-14 ENCOUNTER — Ambulatory Visit (INDEPENDENT_AMBULATORY_CARE_PROVIDER_SITE_OTHER): Payer: Medicare Other

## 2018-10-14 DIAGNOSIS — Z953 Presence of xenogenic heart valve: Secondary | ICD-10-CM | POA: Diagnosis not present

## 2018-10-14 DIAGNOSIS — I340 Nonrheumatic mitral (valve) insufficiency: Secondary | ICD-10-CM

## 2018-10-14 DIAGNOSIS — Z5181 Encounter for therapeutic drug level monitoring: Secondary | ICD-10-CM | POA: Diagnosis not present

## 2018-10-14 LAB — POCT INR: INR: 3.7 — AB (ref 2.0–3.0)

## 2018-10-14 NOTE — Patient Instructions (Signed)
Please skip coumadin tonight, then START NEW DOSAGE of 1.5 tablets every day EXCEPT 1 TABLET ON MONDAYS, [place]. Recheck in 2 weeks.

## 2018-10-23 ENCOUNTER — Other Ambulatory Visit: Payer: Self-pay | Admitting: Physician Assistant

## 2018-10-26 ENCOUNTER — Ambulatory Visit (INDEPENDENT_AMBULATORY_CARE_PROVIDER_SITE_OTHER): Payer: Medicare Other

## 2018-10-26 ENCOUNTER — Other Ambulatory Visit (INDEPENDENT_AMBULATORY_CARE_PROVIDER_SITE_OTHER): Payer: Medicare Other

## 2018-10-26 DIAGNOSIS — I442 Atrioventricular block, complete: Secondary | ICD-10-CM | POA: Diagnosis not present

## 2018-10-26 DIAGNOSIS — Z5181 Encounter for therapeutic drug level monitoring: Secondary | ICD-10-CM

## 2018-10-26 DIAGNOSIS — I340 Nonrheumatic mitral (valve) insufficiency: Secondary | ICD-10-CM

## 2018-10-26 DIAGNOSIS — Z953 Presence of xenogenic heart valve: Secondary | ICD-10-CM

## 2018-10-26 DIAGNOSIS — Z79899 Other long term (current) drug therapy: Secondary | ICD-10-CM

## 2018-10-26 DIAGNOSIS — I5042 Chronic combined systolic (congestive) and diastolic (congestive) heart failure: Secondary | ICD-10-CM

## 2018-10-26 LAB — POCT INR: INR: 3.7 — AB (ref 2.0–3.0)

## 2018-10-26 NOTE — Patient Instructions (Signed)
Please skip coumadin tonight, then START NEW DOSAGE of 1 tablet every day EXCEPT 1.5 TABLETS ON MONDAYS, [place]. Recheck in 2 weeks.

## 2018-10-27 ENCOUNTER — Ambulatory Visit (INDEPENDENT_AMBULATORY_CARE_PROVIDER_SITE_OTHER): Payer: Medicare Other | Admitting: Internal Medicine

## 2018-10-27 ENCOUNTER — Encounter: Payer: Self-pay | Admitting: Internal Medicine

## 2018-10-27 VITALS — BP 138/69 | HR 94 | Ht 61.0 in | Wt 128.0 lb

## 2018-10-27 DIAGNOSIS — I340 Nonrheumatic mitral (valve) insufficiency: Secondary | ICD-10-CM | POA: Diagnosis not present

## 2018-10-27 DIAGNOSIS — I4819 Other persistent atrial fibrillation: Secondary | ICD-10-CM | POA: Diagnosis not present

## 2018-10-27 DIAGNOSIS — Z79899 Other long term (current) drug therapy: Secondary | ICD-10-CM

## 2018-10-27 DIAGNOSIS — I2510 Atherosclerotic heart disease of native coronary artery without angina pectoris: Secondary | ICD-10-CM

## 2018-10-27 DIAGNOSIS — I442 Atrioventricular block, complete: Secondary | ICD-10-CM | POA: Diagnosis not present

## 2018-10-27 DIAGNOSIS — Z950 Presence of cardiac pacemaker: Secondary | ICD-10-CM | POA: Diagnosis not present

## 2018-10-27 LAB — HEPATIC FUNCTION PANEL
ALBUMIN: 4.4 g/dL (ref 3.6–4.6)
ALK PHOS: 88 IU/L (ref 39–117)
ALT: 20 IU/L (ref 0–32)
AST: 19 IU/L (ref 0–40)
BILIRUBIN TOTAL: 0.4 mg/dL (ref 0.0–1.2)
BILIRUBIN, DIRECT: 0.11 mg/dL (ref 0.00–0.40)
Total Protein: 6.7 g/dL (ref 6.0–8.5)

## 2018-10-27 LAB — CBC WITH DIFFERENTIAL/PLATELET
BASOS: 1 %
Basophils Absolute: 0.1 10*3/uL (ref 0.0–0.2)
EOS (ABSOLUTE): 0.1 10*3/uL (ref 0.0–0.4)
EOS: 1 %
HEMATOCRIT: 35.4 % (ref 34.0–46.6)
HEMOGLOBIN: 11.8 g/dL (ref 11.1–15.9)
IMMATURE GRANULOCYTES: 0 %
Immature Grans (Abs): 0 10*3/uL (ref 0.0–0.1)
Lymphocytes Absolute: 0.7 10*3/uL (ref 0.7–3.1)
Lymphs: 9 %
MCH: 33.2 pg — ABNORMAL HIGH (ref 26.6–33.0)
MCHC: 33.3 g/dL (ref 31.5–35.7)
MCV: 100 fL — AB (ref 79–97)
MONOCYTES: 7 %
MONOS ABS: 0.5 10*3/uL (ref 0.1–0.9)
NEUTROS PCT: 82 %
Neutrophils Absolute: 5.7 10*3/uL (ref 1.4–7.0)
Platelets: 215 10*3/uL (ref 150–450)
RBC: 3.55 x10E6/uL — AB (ref 3.77–5.28)
RDW: 13.8 % (ref 11.7–15.4)
WBC: 7 10*3/uL (ref 3.4–10.8)

## 2018-10-27 LAB — TSH: TSH: 2.86 u[IU]/mL (ref 0.450–4.500)

## 2018-10-27 MED ORDER — FUROSEMIDE 40 MG PO TABS: 20.0000 mg | ORAL_TABLET | Freq: Two times a day (BID) | ORAL | Status: DC

## 2018-10-27 MED ORDER — BISOPROLOL FUMARATE 5 MG PO TABS: 5.0000 mg | ORAL_TABLET | Freq: Every day | ORAL | 3 refills | Status: DC

## 2018-10-27 MED ORDER — SACUBITRIL-VALSARTAN 24-26 MG PO TABS: 1.0000 | ORAL_TABLET | Freq: Two times a day (BID) | ORAL | 6 refills | Status: DC

## 2018-10-27 NOTE — Progress Notes (Signed)
See reply Patient Care Team: Ezequiel Kayser, MD as PCP - General (Internal Medicine) Wellington Hampshire, MD as PCP - Cardiology (Cardiology) Alisa Graff, FNP as Nurse Practitioner (Family Medicine)   HPI  [name] is a 84 y.o. female Seen in follow-up for a pacemaker implanted 8/16 in the context of multi-valvular surgery bioprosthetic mitral valve replacement, tricuspid valve repair and MAZE operation. She developed postoperative complete heart block [facility])  She has a history of atrial fibrillation she also developed a sternal wound infection requiring debridement.  She is anticoagulated with Coumadin and aspirin  DATE TEST EF   5/16 Echo   65 %   12/16 Echo   30-35 %   1/18 Echo  55%   11/19 Echo  20-25%   12/19 Echo  35%   2/20 Echo  35%     At her last visit, there were complaints of exercise intolerance.  We decreased rate response slope as well as the ADL rate from 100--85.  She was better.   With interval complaints of worsening exercise tolerance she was admitted for echocardiogram.  11/19 she was noted to be in rapid atrial fibrillation.  She was admitted 11/19 and started on amiodarone with a marked improvement in symptoms  Date Cr K Hgb TSH LFTs  8/17     9.2    5/18 0.8  11.5    5/19 0.8 3.9 13.5 (MCV 103<<89)    11/19 0.8 3.9 9.9 2.168 --  2/20   11.8 2.86 20           She is doing well today. Accompanied by her husband. She is tolerating her new medication. She did report a dry cough, but adds it went away.   She experiences dizziness when walking, causing her to stagger. Very rarely is dizzy when standing up from a sitting position. She endorses swelling to her bilateral lower extremities, adding her lasix causes her to urinate. If she notices she is retaining more fluid she will take an extra dose of her lasix. Avoiding salt intake as much as possible.   She denies nausea or any other related symptoms at this time.     Past Medical History:    Diagnosis Date   Anxiety    Arthritis    "hands" (11/10/2015)   CAD s/p CABG x 1    a. 04/2015 [place] to LAD   Chronic combined systolic (congestive) and diastolic (congestive) heart failure ([place])    a. 01/2015 Echo: EF 50-55%, Gr2 DD (preop valve surgery); b. 08/2015 Echo: EF 30-35%, diff HK. Gr2 DD; c. 09/2016 Echo: EF 50-55%. Nl fxning TV ring. Mildly dil LA.   COPD (chronic obstructive pulmonary disease) (HCC)    Coronary artery disease    GERD (gastroesophageal reflux disease)    History of colon polyps    History of mitral valve replacement    a. 04/2015 s/p 27 mm [facility] Mitral bovine bioprosthetic tissue valve   Hyperlipidemia    Hypertension    Hypothyroidism    Maze operation for AF w/ LAA clipping    a. 04/2015 Complete bilateral atrial lesion set using cryothermy and bipolar radiofrequency ablation with clipping of LA appendage   Meniere disease    Meniere's disease    On home oxygen therapy    "2L at night" (11/10/2015)   PAF (paroxysmal atrial fibrillation) ([place])    Pneumonia ~ 2010   PONV (postoperative nausea and vomiting)    Pt  felt like eardrums were gonna pop   Post-surgical complete heart block, symptomatic    a. 04/2015 s/p MDT [id number] [place]vis[place] DR MRI DC PPM.   Presence of permanent cardiac pacemaker    Pulmonary hypertension ([employer])    Surgical wound, non healing - chest wall 11/10/2015   Tricuspid Regurgitation s/p Repair    a. 04/2015 s/p 26 mm Edwards mc3 ring annuloplasty   Wound infection 10/13/2015   Superficial sternal wound infection    Past Surgical History:  Procedure Laterality Date   APPENDECTOMY     APPLICATION OF A-CELL OF CHEST/ABDOMEN N/A 11/21/2015   Procedure: APPLICATION OF A-CELL OF CHEST/ABDOMEN;  Surgeon: Loel Lofty Dillingham, DO;  Location: [place];  Service: Plastics;  Laterality: N/A;   APPLICATION OF A-CELL OF CHEST/ABDOMEN Right 12/21/2015   Procedure: APPLICATION OF A-CELL OF RIGHT CHEST;  Surgeon: Loel Lofty  Dillingham, DO;  Location: [place];  Service: Plastics;  Laterality: Right;   APPLICATION OF A-CELL OF CHEST/ABDOMEN Right 01/11/2016   Procedure: APPLICATION OF A-CELL OF RIGHT CHEST;  Surgeon: Loel Lofty Dillingham, DO;  Location: [place];  Service: Plastics;  Laterality: Right;   APPLICATION OF A-CELL OF CHEST/ABDOMEN Right 02/12/2016   Procedure: APPLICATION OF A-CELL TO RIGHT CHEST WOUND;  Surgeon: Loel Lofty Dillingham, DO;  Location: [place];  Service: Plastics;  Laterality: Right;   APPLICATION OF WOUND VAC N/A 05/09/2015   Procedure: APPLICATION OF WOUND VAC;  Surgeon: Rexene Alberts, MD;  Location: [place];  Service: Thoracic;  Laterality: N/A;   APPLICATION OF WOUND VAC N/A 11/13/2015   Procedure: APPLICATION OF WOUND VAC;  Surgeon: Rexene Alberts, MD;  Location: [place];  Service: Thoracic;  Laterality: N/A;   APPLICATION OF WOUND VAC N/A 11/21/2015   Procedure: APPLICATION OF WOUND VAC;  Surgeon: Loel Lofty Dillingham, DO;  Location: [place];  Service: Plastics;  Laterality: N/A;   APPLICATION OF WOUND VAC Right 12/21/2015   Procedure: APPLICATION OF WOUND VAC to right chest wall ;  Surgeon: Loel Lofty Dillingham, DO;  Location: [place];  Service: Plastics;  Laterality: Right;   APPLICATION OF WOUND VAC Right 01/11/2016   Procedure: APPLICATION OF WOUND VAC RIGHT CHEST ;  Surgeon: Loel Lofty Dillingham, DO;  Location: [place];  Service: Plastics;  Laterality: Right;   APPLICATION OF WOUND VAC Right 01/24/2016   Procedure: APPLICATION OF WOUND VAC RIGHT CHEST WALL;  Surgeon: Loel Lofty Dillingham, DO;  Location: [place];  Service: Plastics;  Laterality: Right;   APPLICATION OF WOUND VAC Right 02/12/2016   Procedure: RE-APPLICATION OF WOUND VAC TO RIGHT CHEST WOUND;  Surgeon: Loel Lofty Dillingham, DO;  Location: [place];  Service: Plastics;  Laterality: Right;   BREAST BIOPSY Left 11/25/06   neg   BREAST BIOPSY Left 01/20/12   /clip-neg   CARDIAC CATHETERIZATION  11/2013   [facility]   CARDIAC CATHETERIZATION  10/2014    [facility]   CARDIAC VALVE REPLACEMENT     CARDIOVERSION N/A 07/20/2018   Procedure: CARDIOVERSION;  Surgeon: Wellington Hampshire, MD;  Location: [facility];  Service: Cardiovascular;  Laterality: N/A;   CATARACT EXTRACTION W/ INTRAOCULAR LENS  IMPLANT, BILATERAL Bilateral 2014   CLIPPING OF ATRIAL APPENDAGE N/A 04/25/2015   Procedure: CLIPPING OF ATRIAL APPENDAGE;  Surgeon: Rexene Alberts, MD;  Location: [place];  Service: Open Heart Surgery;  Laterality: N/A;   COCHLEAR IMPLANT Left 2005?   COLONOSCOPY WITH PROPOFOL N/A 01/20/2018   Procedure: COLONOSCOPY WITH PROPOFOL;  Surgeon: [place], [place]  K, MD;  Location: [facility];  Service: Gastroenterology;  Laterality: N/A;   CORONARY ANGIOPLASTY     CORONARY ARTERY BYPASS GRAFT N/A 04/25/2015   Procedure: CORONARY ARTERY BYPASS GRAFTING (CABG) x ONE, using left internal mammary artery;  Surgeon: Rexene Alberts, MD;  Location: [place];  Service: Open Heart Surgery;  Laterality: N/A;   DILATION AND CURETTAGE OF UTERUS  "several before hysterectomy"   EP IMPLANTABLE DEVICE N/A 05/02/2015   Procedure: Pacemaker Implant;  Surgeon: Thompson Grayer, MD;  Location: [place] CV LAB;  Service: Cardiovascular;  Laterality: N/A;   ESOPHAGOGASTRODUODENOSCOPY (EGD) WITH PROPOFOL N/A 01/20/2018   Procedure: ESOPHAGOGASTRODUODENOSCOPY (EGD) WITH PROPOFOL;  Surgeon: [place], Benay Pike, MD;  Location: [facility];  Service: Gastroenterology;  Laterality: N/A;   EYE SURGERY     I&D EXTREMITY Right 12/06/2015   Procedure: IRRIGATION AND DEBRIDEMENT RIGHT CHEST WALL WITH ACELL PLACEMENT AND VAC;  Surgeon: Loel Lofty Dillingham, DO;  Location: [place];  Service: Plastics;  Laterality: Right;   INCISION AND DRAINAGE OF WOUND N/A 11/21/2015   Procedure: IRRIGATION AND DEBRIDEMENT WOUND;  Surgeon: Loel Lofty Dillingham, DO;  Location: [place];  Service: Plastics;  Laterality: N/A;   INCISION AND DRAINAGE OF WOUND Right 12/21/2015   Procedure: IRRIGATION AND DEBRIDEMENT right  chest wall WOUND;  Surgeon: Loel Lofty Dillingham, DO;  Location: [place];  Service: Plastics;  Laterality: Right;  right chest wall    INCISION AND DRAINAGE OF WOUND Right 01/24/2016   Procedure: IRRIGATION AND DEBRIDEMENT RIGHT CHEST WALL WOUND;  Surgeon: Loel Lofty Dillingham, DO;  Location: [place];  Service: Plastics;  Laterality: Right;   INCISION AND DRAINAGE OF WOUND Right 03/28/2016   Procedure: IRRIGATION AND DEBRIDEMENT RIGHT CHEST WALL WOUND WITH A Cell Placement;  Surgeon: Loel Lofty Dillingham, DO;  Location: [place];  Service: Plastics;  Laterality: Right;   INCISION AND DRAINAGE OF WOUND Right 05/17/2016   Procedure: IRRIGATION AND DEBRIDEMENT OF RIGHT CHEST WOUND WITH A CELL PLACEMENT;  Surgeon: Loel Lofty Dillingham, DO;  Location: [place];  Service: Plastics;  Laterality: Right;   INSERT / REPLACE / REMOVE PACEMAKER     IRRIGATION AND DEBRIDEMENT OF WOUND WITH SPLIT THICKNESS SKIN GRAFT Right 01/11/2016   Procedure: IRRIGATION AND DEBRIDEMENT OF RIGHT CHEST WOUND ;  Surgeon: Loel Lofty Dillingham, DO;  Location: [place];  Service: Plastics;  Laterality: Right;   MASTECTOMY, PARTIAL Right 2002   positive   MAZE N/A 04/25/2015   Procedure: MAZE;  Surgeon: Rexene Alberts, MD;  Location: [place];  Service: Open Heart Surgery;  Laterality: N/A;   MITRAL VALVE REPAIR N/A 04/25/2015   Procedure: MITRAL VALVE  REPLACEMENT using a 27 mm Edwards Perimount Magna Mitral Ease Valve;  Surgeon: Rexene Alberts, MD;  Location: [place];  Service: Open Heart Surgery;  Laterality: N/A;   SKIN SPLIT GRAFT Right 02/12/2016   Procedure: IRRIGATION AND DEBRIDEMENT RIGHT CHEST WOUND;  Surgeon: Loel Lofty Dillingham, DO;  Location: [place];  Service: Plastics;  Laterality: Right;   STERNAL WIRES REMOVAL N/A 06/05/2015   Procedure: STERNAL WIRES REMOVAL;  Surgeon: Rexene Alberts, MD;  Location: [place];  Service: Thoracic;  Laterality: N/A;   STERNAL WOUND DEBRIDEMENT N/A 05/09/2015   Procedure: STERNAL WOUND DEBRIDEMENT;   Surgeon: Rexene Alberts, MD;  Location: [place];  Service: Thoracic;  Laterality: N/A;   STERNAL WOUND DEBRIDEMENT N/A 11/13/2015   Procedure: Excisional drainage of RIGHT Chest wall mass and breast mass ;  Surgeon: Rexene Alberts, MD;  Location: [place];  Service: Thoracic;  Laterality: N/A;   TEE WITHOUT CARDIOVERSION N/A 04/25/2015   Procedure: TRANSESOPHAGEAL ECHOCARDIOGRAM (TEE);  Surgeon: Rexene Alberts, MD;  Location: [place];  Service: Open Heart Surgery;  Laterality: N/A;   TONSILLECTOMY     TRAM Right 11/18/2015   Procedure: VRAM Vertical Rectus Abdominus Muscle Flap;  Surgeon: Loel Lofty Dillingham, DO;  Location: [place];  Service: Plastics;  Laterality: Right;  RIght Back   TRICUSPID VALVE REPLACEMENT N/A 04/25/2015   Procedure: TRICUSPID VALVE REPAIR;  Surgeon: Rexene Alberts, MD;  Location: [place];  Service: Open Heart Surgery;  Laterality: N/A;   VAGINAL HYSTERECTOMY      Current Outpatient Medications  Medication Sig Dispense Refill   acetaminophen (TYLENOL) 325 MG tablet Take 2 tablets (650 mg total) by mouth every 4 (four) hours as needed for mild pain (or Fever >/= 101).     AMBULATORY NON FORMULARY MEDICATION Inogen at Home Oxygen machine with tubing. DX: COPD DX Code: J44.9 1 each 0   amiodarone (PACERONE) 200 MG tablet Take 1 tablet (200 mg total) by mouth daily. 90 tablet 0   aspirin EC 81 MG EC tablet Take 1 tablet (81 mg total) by mouth daily.     azelastine (ASTELIN) 0.1 % nasal spray Place 1 spray into both nostrils 2 (two) times daily as needed for rhinitis or allergies.      bisoprolol (ZEBETA) 10 MG tablet Take 1 tablet (10 mg total) by mouth daily. 90 tablet 0   ferrous sulfate 325 (65 FE) MG tablet Take 325 mg by mouth daily with breakfast.     fluticasone (FLONASE) 50 MCG/ACT nasal spray Place 1 spray into both nostrils daily as needed for allergies.      furosemide (LASIX) 40 MG tablet Take 0.5 tablets (20 mg total) by mouth daily.     gabapentin  (NEURONTIN) 100 MG capsule Take 200 mg by mouth at bedtime.   1   ipratropium-albuterol (DUONEB) 0.5-2.5 (3) MG/3ML SOLN Take 3 mLs by nebulization every 6 (six) hours as needed. DX:J44.9 (Patient taking differently: Take 3 mLs by nebulization every 6 (six) hours as needed (for wheezing/shortness of breath). ) 1080 mL 1   levothyroxine (SYNTHROID, LEVOTHROID) 88 MCG tablet Take 88 mcg by mouth daily before breakfast.     losartan (COZAAR) 25 MG tablet TAKE 1 TABLET BY MOUTH EVERY DAY 90 tablet 3   Multiple Vitamins-Minerals (PRESERVISION AREDS 2 PO) Take 2 tablets by mouth daily.     pantoprazole (PROTONIX) 40 MG tablet Take 40 mg by mouth 2 (two) times daily.      simvastatin (ZOCOR) 10 MG tablet Take 1 tablet (10 mg total) by mouth at bedtime. 90 tablet 3   spironolactone (ALDACTONE) 25 MG tablet Take 0.5 tablets (12.5 mg total) by mouth daily. 45 tablet 3   warfarin (COUMADIN) 2.5 MG tablet TAKE AS DIRECTED BY COUMADIN CLINIC 135 tablet 0   warfarin (COUMADIN) 3 MG tablet Take 1 tablet (3 mg total) by mouth one time only at 6 PM. 30 tablet 0   No current facility-administered medications for this visit.     Allergies  Allergen Reactions   Ace Inhibitors Cough and Other (See Comments)   Amoxicillin-Pot Clavulanate Swelling and Other (See Comments)    JOINTS, PAIN Joint pain    Penicillins Swelling and Other (See Comments)    SWOLLEN JOINTS  Has patient had a PCN reaction causing immediate  rash, facial/tongue/throat swelling, SOB or lightheadedness with hypotension: Yes Has patient had a PCN reaction causing severe rash involving mucus membranes or skin necrosis: No Has patient had a PCN reaction that required hospitalization No Has patient had a PCN reaction occurring within the last 10 years: No If all of the above answers are "NO", then may proceed with Cephalosporin use.  Swollen joints   Nifedipine Other (See Comments)    UNSPECIFIED UNSPECIFIED    Propranolol  Other (See Comments)    UNSPECIFIED UNSPECIFIED    Diazepam Other (See Comments)    Made her "hyper" Made her "hyper" Made her "hyper"    Meperidine Other (See Comments)    Made her "hyper" Made her "hyper" Made her "hyper"    Propoxyphene Other (See Comments)    Made her "hyper" Made her "hyper" Made her "hyper"       Review of Systems negative except from HPI and PMH  Physical Exam BP 138/69 (BP Location: Left Arm, Patient Position: Sitting, Cuff Size: Normal)    Pulse 94    Ht 5\' 1"  (1.549 m)    Wt 128 lb (58.1 kg)    BMI 24.19 kg/m    Well developed and well nourished in no acute distress HENT normal Neck supple with JVP 8 Clear Device pocket well healed; without hematoma or erythema.  There is no tethering  Regular rate and rhythm, no  gallop No murmur Abd-soft with active BS No Clubbing cyanosis  tr edema Skin-warm and dry A & Oriented  Grossly normal sensory and motor function    ECG  AV pacing @ 64 28/19/54  Assessment and  Plan  Atrial fibrillation-persistent status post maze operation  Amiodarone   Complete heart block -Intermittent  Sinus node dysfunction   First-degree AV block profound  Status post mitral valve replacement-bioprosthetic with tricuspid valve repair  Pacemaker-Medtronic  Dypsnea on Exertion   Cardiomyopathy     Continue amiodarone.  Appears to be tolerating it.  No significant interval atrial fibrillation.  LV function has not improved.  We will continue to medically manage her initially with a transition from losartan--Entresto.  We will decrease her bisoprolol so as to avoid lightheadedness.  Recheck echo in 3 months.  If no improvement will plan CRT upgrade.  We will increase to furosemide to 20 bid  We spent more than 50% of our >25 min visit in face to face counseling regarding the above   I, Margit Banda am acting as a scribe for Virl Axe, M.D. I have reviewed the above documentation for  accuracy and completeness, and I agree with the above.   Signed, Virl Axe, MD 10/27/18 [place], [place]

## 2018-10-27 NOTE — Patient Instructions (Addendum)
Medication Instructions:  - Your physician has recommended you make the following change in your medication:   1) STOP cozaar (losartan)  2) START entresto 24/26 mg- take 1 tablet by mouth twice daily  3) INCREASE lasix (furosemide) 40 mg- take 1/2 tablet (20 mg) by mouth twice daily  4) DECREASE bisoprolol to 5 mg- take 1 tablet by mouth once daily  Samples given: Entresto 24/26 mg Lot: [id number] Exp: 11/2020 # 1 bottle  If you need a refill on your cardiac medications before your next appointment, please call your pharmacy.   Lab work: - Your physician recommends that you return for lab work in: 2 weeks- BMP  If you have labs (blood work) drawn today and your tests are completely normal, you will receive your results only by: Marland Kitchen MyChart Message (if you have MyChart) OR . A paper copy in the mail If you have any lab test that is abnormal or we need to change your treatment, we will call you to review the results.  Testing/Procedures: - Your physician has requested that you have an echocardiogram in 3 months. Echocardiography is a painless test that uses sound waves to create images of your heart. It provides your doctor with information about the size and shape of your heart and how well your heart's chambers and valves are working. This procedure takes approximately one hour. There are no restrictions for this procedure.   Follow-Up: At [facility], you and your health needs are our priority.  As part of our continuing mission to provide you with exceptional heart care, we have created designated Provider Care Teams.  These Care Teams include your primary Cardiologist (physician) and Advanced Practice Providers (APPs -  Physician Assistants and Nurse Practitioners) who all work together to provide you with the care you need, when you need it. . in 3 months with Dr. Caryl Comes (just after your echo is done)   Remote monitoring is used to monitor your Pacemaker of ICD from home. This  monitoring reduces the number of office visits required to check your device to one time per year. It allows [place] to keep an eye on the functioning of your device to ensure it is working properly. You are scheduled for a device check from home on 12/10/2018. You may send your transmission at any time that day. If you have a wireless device, the transmission will be sent automatically. After your physician reviews your transmission, you will receive a postcard with your next transmission date.   Any Other Special Instructions Will Be Listed Below (If Applicable). - N/A

## 2018-10-28 LAB — CUP PACEART INCLINIC DEVICE CHECK
Battery Remaining Longevity: 70 mo
Battery Voltage: 3 V
Brady Statistic AP VP Percent: 55.3 %
Brady Statistic AP VS Percent: 0.04 %
Brady Statistic AS VS Percent: 35.05 %
Brady Statistic RA Percent Paced: 44.39 %
Implantable Lead Implant Date: 20160823
Implantable Lead Location: 753859
Implantable Lead Location: 753860
Implantable Lead Model: 5076
Implantable Lead Model: 5076
Implantable Pulse Generator Implant Date: 20160823
Lead Channel Impedance Value: 361 Ohm
Lead Channel Impedance Value: 380 Ohm
Lead Channel Impedance Value: 418 Ohm
Lead Channel Pacing Threshold Amplitude: 0.5 V
Lead Channel Pacing Threshold Amplitude: 0.75 V
Lead Channel Pacing Threshold Pulse Width: 0.4 ms
Lead Channel Pacing Threshold Pulse Width: 0.4 ms
Lead Channel Sensing Intrinsic Amplitude: 1.75 mV
Lead Channel Setting Pacing Amplitude: 2 V
Lead Channel Setting Pacing Amplitude: 2.5 V
Lead Channel Setting Pacing Pulse Width: 0.4 ms
Lead Channel Setting Sensing Sensitivity: 2.8 mV
MDC IDC LEAD IMPLANT DT: 20160823
MDC IDC MSMT LEADCHNL RV IMPEDANCE VALUE: 399 Ohm
MDC IDC SESS DTM: 20191217170508
MDC IDC STAT BRADY AS VP PERCENT: 9.61 %
MDC IDC STAT BRADY RV PERCENT PACED: 58.47 %

## 2018-11-06 ENCOUNTER — Other Ambulatory Visit: Payer: Self-pay | Admitting: Physician Assistant

## 2018-11-07 ENCOUNTER — Other Ambulatory Visit: Payer: Self-pay | Admitting: Cardiovascular Disease

## 2018-11-09 ENCOUNTER — Ambulatory Visit (INDEPENDENT_AMBULATORY_CARE_PROVIDER_SITE_OTHER): Payer: Medicare Other

## 2018-11-09 ENCOUNTER — Other Ambulatory Visit: Payer: Medicare Other

## 2018-11-09 DIAGNOSIS — Z79899 Other long term (current) drug therapy: Secondary | ICD-10-CM

## 2018-11-09 DIAGNOSIS — Z953 Presence of xenogenic heart valve: Secondary | ICD-10-CM | POA: Diagnosis not present

## 2018-11-09 DIAGNOSIS — I4819 Other persistent atrial fibrillation: Secondary | ICD-10-CM

## 2018-11-09 DIAGNOSIS — I340 Nonrheumatic mitral (valve) insufficiency: Secondary | ICD-10-CM

## 2018-11-09 DIAGNOSIS — Z5181 Encounter for therapeutic drug level monitoring: Secondary | ICD-10-CM

## 2018-11-09 LAB — POCT INR: INR: 2.7 (ref 2.0–3.0)

## 2018-11-09 NOTE — Telephone Encounter (Signed)
Please review for refill. Thanks!  

## 2018-11-09 NOTE — Patient Instructions (Signed)
Please continue dosage of 1 tablet every day EXCEPT 1.5 TABLETS ON MONDAYS, [place]. Recheck in 3 weeks.

## 2018-11-10 LAB — BASIC METABOLIC PANEL
BUN/Creatinine Ratio: 22 (ref 12–28)
BUN: 19 mg/dL (ref 8–27)
CO2: 28 mmol/L (ref 20–29)
Calcium: 9.2 mg/dL (ref 8.7–10.3)
Chloride: 100 mmol/L (ref 96–106)
Creatinine, Ser: 0.87 mg/dL (ref 0.57–1.00)
GFR calc Af Amer: 72 mL/min/{1.73_m2} (ref >59)
GFR calc non Af Amer: 63 mL/min/{1.73_m2} (ref >59)
GLUCOSE: 100 mg/dL — AB (ref 65–99)
Potassium: 4.3 mmol/L (ref 3.5–5.2)
SODIUM: 145 mmol/L — AB (ref 134–144)

## 2018-11-30 ENCOUNTER — Ambulatory Visit (INDEPENDENT_AMBULATORY_CARE_PROVIDER_SITE_OTHER): Payer: Medicare Other

## 2018-11-30 ENCOUNTER — Other Ambulatory Visit: Payer: Self-pay

## 2018-11-30 ENCOUNTER — Ambulatory Visit: Payer: Medicare Other | Admitting: Family

## 2018-11-30 DIAGNOSIS — Z5181 Encounter for therapeutic drug level monitoring: Secondary | ICD-10-CM

## 2018-11-30 DIAGNOSIS — Z953 Presence of xenogenic heart valve: Secondary | ICD-10-CM | POA: Diagnosis not present

## 2018-11-30 DIAGNOSIS — I340 Nonrheumatic mitral (valve) insufficiency: Secondary | ICD-10-CM

## 2018-11-30 LAB — POCT INR: INR: 2.9 (ref 2.0–3.0)

## 2018-11-30 NOTE — Patient Instructions (Signed)
Please continue dosage of 1 tablet every day EXCEPT 1.5 TABLETS ON MONDAYS, [place]. Recheck in 6 weeks.

## 2018-12-10 ENCOUNTER — Other Ambulatory Visit: Payer: Self-pay

## 2018-12-10 ENCOUNTER — Ambulatory Visit (INDEPENDENT_AMBULATORY_CARE_PROVIDER_SITE_OTHER): Payer: Medicare Other | Admitting: *Deleted

## 2018-12-10 DIAGNOSIS — I442 Atrioventricular block, complete: Secondary | ICD-10-CM

## 2018-12-10 LAB — CUP PACEART REMOTE DEVICE CHECK
Battery Remaining Longevity: 68 mo
Battery Voltage: 3 V
Brady Statistic AP VP Percent: 97.26 %
Brady Statistic AP VS Percent: 0.01 %
Brady Statistic AS VP Percent: 2.74 %
Brady Statistic AS VS Percent: 0 %
Brady Statistic RA Percent Paced: 97.23 %
Brady Statistic RV Percent Paced: 99.96 %
Date Time Interrogation Session: 20200402151421
Implantable Lead Implant Date: 20160823
Implantable Lead Implant Date: 20160823
Implantable Lead Location: 753859
Implantable Lead Location: 753860
Implantable Lead Model: 5076
Implantable Lead Model: 5076
Implantable Pulse Generator Implant Date: 20160823
Lead Channel Impedance Value: 380 Ohm
Lead Channel Impedance Value: 437 Ohm
Lead Channel Impedance Value: 437 Ohm
Lead Channel Impedance Value: 437 Ohm
Lead Channel Pacing Threshold Amplitude: 0.5 V
Lead Channel Pacing Threshold Amplitude: 0.625 V
Lead Channel Pacing Threshold Pulse Width: 0.4 ms
Lead Channel Pacing Threshold Pulse Width: 0.4 ms
Lead Channel Sensing Intrinsic Amplitude: 1.875 mV
Lead Channel Sensing Intrinsic Amplitude: 14 mV
Lead Channel Setting Pacing Amplitude: 2 V
Lead Channel Setting Pacing Amplitude: 2.5 V
Lead Channel Setting Pacing Pulse Width: 0.4 ms
Lead Channel Setting Sensing Sensitivity: 2.8 mV

## 2018-12-18 ENCOUNTER — Encounter: Payer: Self-pay | Admitting: Cardiology

## 2018-12-18 NOTE — Progress Notes (Signed)
Remote pacemaker transmission.   

## 2019-01-08 ENCOUNTER — Telehealth: Payer: Self-pay

## 2019-01-08 NOTE — Telephone Encounter (Signed)

## 2019-01-11 ENCOUNTER — Other Ambulatory Visit: Payer: Self-pay

## 2019-01-11 ENCOUNTER — Ambulatory Visit (INDEPENDENT_AMBULATORY_CARE_PROVIDER_SITE_OTHER): Payer: Medicare Other

## 2019-01-11 DIAGNOSIS — Z5181 Encounter for therapeutic drug level monitoring: Secondary | ICD-10-CM

## 2019-01-11 DIAGNOSIS — I340 Nonrheumatic mitral (valve) insufficiency: Secondary | ICD-10-CM | POA: Diagnosis not present

## 2019-01-11 DIAGNOSIS — Z953 Presence of xenogenic heart valve: Secondary | ICD-10-CM | POA: Diagnosis not present

## 2019-01-11 LAB — POCT INR: INR: 3.9 — AB (ref 2.0–3.0)

## 2019-01-11 NOTE — Patient Instructions (Signed)
Please skip coumadin tonight, then resume dosage of 1 tablet every day EXCEPT 1.5 TABLETS ON MONDAYS, [place]. Recheck in 6 weeks.

## 2019-01-12 ENCOUNTER — Telehealth: Payer: Self-pay

## 2019-01-12 NOTE — Telephone Encounter (Signed)
Virtual Visit Pre-Appointment Phone Call 1. Confirm consent - "In the setting of the current Covid19 crisis, you are scheduled for a (phone or video) visit with your provider on (date) at (time).  Just as we do with many in-office visits, in order for you to participate in this visit, we must obtain consent.  If you'd like, I can send this to your mychart (if signed up) or email for you to review.  Otherwise, I can obtain your verbal consent now.  All virtual visits are billed to your insurance company just like a normal visit would be.  By agreeing to a virtual visit, we'd like you to understand that the technology does not allow for your provider to perform an examination, and thus may limit your provider's ability to fully assess your condition. If your provider identifies any concerns that need to be evaluated in person, we will make arrangements to do so.  Finally, though the technology is pretty good, we cannot assure that it will always work on either your or our end, and in the setting of a video visit, we may have to convert it to a phone-only visit.  In either situation, we cannot ensure that we have a secure connection.  Are you willing to proceed?" YES 2. Advise patient to be prepared - "Two hours prior to your appointment, go ahead and check your blood pressure, pulse, oxygen saturation, and your weight (if you have the equipment to check those) and write them all down. When your visit starts, your provider will ask you for this information. If you have an Apple Watch or Kardia device, please plan to have heart rate information ready on the day of your appointment. Please have a pen and paper handy nearby the day of the visit as well."  3. Give patient instructions for MyChart download to smartphone OR Doximity/Doxy.me as below if video visit (depending on what platform provider is using)  4. Inform patient they will receive a phone call 15 minutes prior to their appointment time (may be  from unknown caller ID) so they should be prepared to answer    TELEPHONE CALL NOTE  [name] has been deemed a candidate for a follow-up tele-health visit to limit community exposure during the Covid-19 pandemic. I spoke with the patient via phone to ensure availability of phone/video source, confirm preferred email & phone number, and discuss instructions and expectations.  I reminded [name] to be prepared with any vital sign and/or heart rhythm information that could potentially be obtained via home monitoring, at the time of her visit. I reminded [name] to expect a phone call prior to her visit.  Alba Destine, RMA 01/12/2019 3:29 PM   INSTRUCTIONS FOR DOWNLOADING THE MYCHART APP TO SMARTPHONE  - The patient must first make sure to have activated MyChart and know their login information - If Apple, go to [employer] and type in MyChart in the search bar and download the app. If Android, ask patient to go to [employer] and type in [place] in the search bar and download the app. The app is free but as with any other app downloads, their phone may require them to verify saved payment information or Apple/Android password.  - The patient will need to then log into the app with their MyChart username and password, and select [facility] as their healthcare provider to link the account. When it is time for your visit, go to  the MyChart app, find appointments, and click Begin Video Visit. Be sure to Select Allow for your device to access the Microphone and Camera for your visit. You will then be connected, and your provider will be with you shortly.  **If they have any issues connecting, or need assistance please contact MyChart service desk [phone])**  **If using a computer, in order to ensure the best quality for their visit they will need to use either of the following Internet Browsers: [employer], or Google Chrome**  IF USING DOXIMITY or  DOXY.ME - The patient will receive a link just prior to their visit by text.     FULL LENGTH CONSENT FOR TELE-HEALTH VISIT   I hereby voluntarily request, consent and authorize [facility] and its employed or contracted physicians, physician assistants, nurse practitioners or other licensed health care professionals (the Practitioner), to provide me with telemedicine health care services (the "Services") as deemed necessary by the treating Practitioner. I acknowledge and consent to receive the Services by the Practitioner via telemedicine. I understand that the telemedicine visit will involve communicating with the Practitioner through live audiovisual communication technology and the disclosure of certain medical information by electronic transmission. I acknowledge that I have been given the opportunity to request an in-person assessment or other available alternative prior to the telemedicine visit and am voluntarily participating in the telemedicine visit.  I understand that I have the right to withhold or withdraw my consent to the use of telemedicine in the course of my care at any time, without affecting my right to future care or treatment, and that the Practitioner or I may terminate the telemedicine visit at any time. I understand that I have the right to inspect all information obtained and/or recorded in the course of the telemedicine visit and may receive copies of available information for a reasonable fee.  I understand that some of the potential risks of receiving the Services via telemedicine include:  Marland Kitchen Delay or interruption in medical evaluation due to technological equipment failure or disruption; . Information transmitted may not be sufficient (e.g. poor resolution of images) to allow for appropriate medical decision making by the Practitioner; and/or  . In rare instances, security protocols could fail, causing a breach of personal health information.  Furthermore, I acknowledge  that it is my responsibility to provide information about my medical history, conditions and care that is complete and accurate to the best of my ability. I acknowledge that Practitioner's advice, recommendations, and/or decision may be based on factors not within their control, such as incomplete or inaccurate data provided by me or distortions of diagnostic images or specimens that may result from electronic transmissions. I understand that the practice of medicine is not an exact science and that Practitioner makes no warranties or guarantees regarding treatment outcomes. I acknowledge that I will receive a copy of this consent concurrently upon execution via email to the email address I last provided but may also request a printed copy by calling the office of [facility].    I understand that my insurance will be billed for this visit.   I have read or had this consent read to me. . I understand the contents of this consent, which adequately explains the benefits and risks of the Services being provided via telemedicine.  . I have been provided ample opportunity to ask questions regarding this consent and the Services and have had my questions answered to my satisfaction. . I give my informed consent for  the services to be provided through the use of telemedicine in my medical care  By participating in this telemedicine visit I agree to the above.

## 2019-01-12 NOTE — Telephone Encounter (Signed)
Patient called in reference to the echo scheduled 01/22/19.  She wants clarification if she needs to keep this appointment to come into the office or postpone it .Message routed Dr. Caryl Comes.

## 2019-01-22 ENCOUNTER — Other Ambulatory Visit: Payer: Medicare Other

## 2019-01-25 ENCOUNTER — Emergency Department: Payer: Medicare Other

## 2019-01-25 ENCOUNTER — Telehealth: Payer: Self-pay | Admitting: Internal Medicine

## 2019-01-25 ENCOUNTER — Emergency Department
Admission: EM | Admit: 2019-01-25 | Discharge: 2019-01-26 | Disposition: A | Discharge status: Home / Self Care | Payer: Medicare Other | Attending: Emergency Medicine | Admitting: Emergency Medicine

## 2019-01-25 ENCOUNTER — Encounter: Payer: Self-pay | Admitting: Intensive Care

## 2019-01-25 ENCOUNTER — Other Ambulatory Visit: Payer: Self-pay

## 2019-01-25 DIAGNOSIS — I509 Heart failure, unspecified: Secondary | ICD-10-CM | POA: Diagnosis not present

## 2019-01-25 DIAGNOSIS — Z79899 Other long term (current) drug therapy: Secondary | ICD-10-CM | POA: Insufficient documentation

## 2019-01-25 DIAGNOSIS — I110 Hypertensive heart disease with heart failure: Secondary | ICD-10-CM | POA: Insufficient documentation

## 2019-01-25 DIAGNOSIS — I2510 Atherosclerotic heart disease of native coronary artery without angina pectoris: Secondary | ICD-10-CM | POA: Insufficient documentation

## 2019-01-25 DIAGNOSIS — Z87891 Personal history of nicotine dependence: Secondary | ICD-10-CM | POA: Diagnosis not present

## 2019-01-25 DIAGNOSIS — Z1159 Encounter for screening for other viral diseases: Secondary | ICD-10-CM | POA: Diagnosis not present

## 2019-01-25 DIAGNOSIS — R0602 Shortness of breath: Secondary | ICD-10-CM

## 2019-01-25 LAB — BASIC METABOLIC PANEL
Anion gap: 13 (ref 5–15)
BUN: 30 mg/dL — ABNORMAL HIGH (ref 8–23)
CO2: 27 mmol/L (ref 22–32)
Calcium: 8.7 mg/dL — ABNORMAL LOW (ref 8.9–10.3)
Chloride: 102 mmol/L (ref 98–111)
Creatinine, Ser: 1.04 mg/dL — ABNORMAL HIGH (ref 0.44–1.00)
GFR calc Af Amer: 58 mL/min — ABNORMAL LOW (ref >60)
GFR calc non Af Amer: 50 mL/min — ABNORMAL LOW (ref >60)
Glucose, Bld: 135 mg/dL — ABNORMAL HIGH (ref 70–99)
Potassium: 3.2 mmol/L — ABNORMAL LOW (ref 3.5–5.1)
Sodium: 142 mmol/L (ref 135–145)

## 2019-01-25 LAB — CBC
HCT: 35.2 % — ABNORMAL LOW (ref 36.0–46.0)
Hemoglobin: 11.3 g/dL — ABNORMAL LOW (ref 12.0–15.0)
MCH: 32.6 pg (ref 26.0–34.0)
MCHC: 32.1 g/dL (ref 30.0–36.0)
MCV: 101.4 fL — ABNORMAL HIGH (ref 80.0–100.0)
Platelets: 247 10*3/uL (ref 150–400)
RBC: 3.47 MIL/uL — ABNORMAL LOW (ref 3.87–5.11)
RDW: 15.1 % (ref 11.5–15.5)
WBC: 10 10*3/uL (ref 4.0–10.5)
nRBC: 0 % (ref 0.0–0.2)

## 2019-01-25 LAB — TROPONIN I: Troponin I: <0.03 ng/mL (ref <0.03)

## 2019-01-25 LAB — PROTIME-INR
INR: 3 — ABNORMAL HIGH (ref 0.8–1.2)
Prothrombin Time: 30.8 seconds — ABNORMAL HIGH (ref 11.4–15.2)

## 2019-01-25 LAB — SARS CORONAVIRUS 2 BY RT PCR (HOSPITAL ORDER, PERFORMED IN ~~LOC~~ HOSPITAL LAB): SARS Coronavirus 2: NEGATIVE

## 2019-01-25 MED ORDER — FUROSEMIDE 10 MG/ML IJ SOLN
40.0000 mg | Freq: Once | INTRAMUSCULAR | Status: AC
  Administered 2019-01-25: 40 mg via INTRAVENOUS
  Filled 2019-01-25: qty 4

## 2019-01-25 MED ORDER — POTASSIUM CHLORIDE CRYS ER 20 MEQ PO TBCR
40.0000 meq | EXTENDED_RELEASE_TABLET | Freq: Once | ORAL | Status: AC
  Administered 2019-01-25: 40 meq via ORAL
  Filled 2019-01-25: qty 2

## 2019-01-25 NOTE — Telephone Encounter (Signed)
Returned call to patient. She reports ongoing dry cough during waking hours. She reported cough at last OV with Caryl Comes 2/18. She started on Entresto 3 months ago and believes it is contributing to sx.   Resting o2 in daytime is mid 90s. She uses 2 L [place] while sleeping. Over the past 2 days dry cough and SOB with exertion has increased.   O2 rate with exertion is in low 80s. 83,84%. She speaks in full sentences but audible dyspnea is noted. No coughing heard during 10 min phone conversation. She denies cough at night.   She denies fevers or chest pain. She has been in strict quarantine, only going out for coumadin check, last on 5/4. She denies visitors and husband has been well.    She did report swelling in ankles yesterday but improved to baseline by this am. She denies inc salt in diet.   Weight today 125 lbs.   Pt walked across the room (10 steps) to get oxygen meter, o2 was 89% and HR 65. Pt breathing heavily into phone, I can hear her sucking air into nose. Improved with rest, after 5 min of sitting o2 sat 93% HR 63.    Routed to provider to advise.

## 2019-01-25 NOTE — ED Provider Notes (Signed)
[facility] Emergency Department Provider Note  ____________________________________________   I have reviewed the triage vital signs and the nursing notes.   HISTORY  Chief Complaint Shortness of Breath   History limited by: Not Limited   HPI [name] is a 84 y.o. female who presents to the emergency department today because of concern for shortness of breath and possible fluid overload. The patient states that over the past couple of days her breathing has become worse. She has also noted some weight gain. The patient did start taking double her normal lasix dose as instructed by her physician. She states however that she feels like it has not caused her to have as much output as normal. The patient denies any chest pain or fever. No known sick contacts. She does use oxygen at night.   Records reviewed. Per medical record review patient has a history of cad, COPD, GERD, CHF.  Past Medical History:  Diagnosis Date  . Anxiety   . Arthritis    "hands" (11/10/2015)  . CAD s/p CABG x 1    a. 04/2015 LIMA to LAD  . Chronic combined systolic (congestive) and diastolic (congestive) heart failure ([place])    a. 01/2015 Echo: EF 50-55%, Gr2 DD (preop valve surgery); b. 08/2015 Echo: EF 30-35%, diff HK. Gr2 DD; c. 09/2016 Echo: EF 50-55%. Nl fxning TV ring. Mildly dil LA.  Marland Kitchen COPD (chronic obstructive pulmonary disease) ([place])   . Coronary artery disease   . GERD (gastroesophageal reflux disease)   . History of colon polyps   . History of mitral valve replacement    a. 04/2015 s/p 27 mm [facility] Mitral bovine bioprosthetic tissue valve  . Hyperlipidemia   . Hypertension   . Hypothyroidism   . 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 04/2015 Complete bilateral atrial lesion set using cryothermy and bipolar radiofrequency ablation with clipping of LA appendage  . Meniere disease   . Meniere's disease   . On home oxygen therapy    "2L at night" (11/10/2015)  .  PAF (paroxysmal atrial fibrillation) ([place])   . Pneumonia ~ 2010  . PONV (postoperative nausea and vomiting)    Pt felt like eardrums were gonna pop  . Post-surgical complete heart block, symptomatic    a. 04/2015 s/p MDT [id number] Advisa DR MRI DC PPM.  . Presence of permanent cardiac pacemaker   . Pulmonary hypertension ([place])   . Surgical wound, non healing - chest wall 11/10/2015  . Tricuspid Regurgitation s/p Repair    a. 04/2015 s/p 26 mm Edwards mc3 ring annuloplasty  . Wound infection 10/13/2015   Superficial sternal wound infection    Patient Active Problem List   Diagnosis Date Noted  . Chronic diastolic CHF (congestive heart failure) ([place])   . S/P placement of cardiac pacemaker 05/02/15, medtronic 05/03/2015  . Bradycardia 05/03/2015  . S/P mitral valve replacement with bioprosthetic valve, tricuspid valve repair, maze procedure and CABG x1 04/25/2015  . S/P tricuspid valve repair 04/25/2015  . S/P Maze operation for atrial fibrillation 04/25/2015  . S/P CABG x 1 04/25/2015  . Coronary artery disease   . Pulmonary hypertension ([place])   . Tricuspid regurgitation   . Chronic combined systolic and diastolic CHF (congestive heart failure) ([place])   . Chronic systolic heart failure ([place]) 11/10/2014  . Essential hypertension 11/10/2014  . Mitral regurgitation 11/10/2014  . Hyperlipidemia 11/10/2014  . Disorder of mitral valve 11/10/2014  . Atrial fibrillation ([place])  11/10/2014  . Acid reflux 05/10/2014  . Type 2 diabetes mellitus ([place]) 05/08/2014  . Auditory vertigo 05/08/2014  . Adult hypothyroidism 05/08/2014  . Hypercholesterolemia 05/08/2014  . H/O adenomatous polyp of colon 05/08/2014  . Benign essential HTN 05/08/2014  . Moderate COPD (chronic obstructive pulmonary disease) ([place]) 03/28/2014    Past Surgical History:  Procedure Laterality Date  . APPENDECTOMY    . APPLICATION OF A-CELL OF CHEST/ABDOMEN N/A 11/21/2015   Procedure: APPLICATION OF A-CELL OF CHEST/ABDOMEN;   Surgeon: Loel Lofty Dillingham, DO;  Location: [place];  Service: Plastics;  Laterality: N/A;  . APPLICATION OF A-CELL OF CHEST/ABDOMEN Right 12/21/2015   Procedure: APPLICATION OF A-CELL OF RIGHT CHEST;  Surgeon: Loel Lofty Dillingham, DO;  Location: [place];  Service: Plastics;  Laterality: Right;  . APPLICATION OF A-CELL OF CHEST/ABDOMEN Right 01/11/2016   Procedure: APPLICATION OF A-CELL OF RIGHT CHEST;  Surgeon: Loel Lofty Dillingham, DO;  Location: [place];  Service: Plastics;  Laterality: Right;  . APPLICATION OF A-CELL OF CHEST/ABDOMEN Right 02/12/2016   Procedure: APPLICATION OF A-CELL TO RIGHT CHEST WOUND;  Surgeon: Loel Lofty Dillingham, DO;  Location: [place];  Service: Plastics;  Laterality: Right;  . APPLICATION OF WOUND VAC N/A 05/09/2015   Procedure: APPLICATION OF WOUND VAC;  Surgeon: Rexene Alberts, MD;  Location: [place];  Service: Thoracic;  Laterality: N/A;  . APPLICATION OF WOUND VAC N/A 11/13/2015   Procedure: APPLICATION OF WOUND VAC;  Surgeon: Rexene Alberts, MD;  Location: [place];  Service: Thoracic;  Laterality: N/A;  . APPLICATION OF WOUND VAC N/A 11/21/2015   Procedure: APPLICATION OF WOUND VAC;  Surgeon: Loel Lofty Dillingham, DO;  Location: [place];  Service: Plastics;  Laterality: N/A;  . APPLICATION OF WOUND VAC Right 12/21/2015   Procedure: APPLICATION OF WOUND VAC to right chest wall ;  Surgeon: Loel Lofty Dillingham, DO;  Location: [place];  Service: Plastics;  Laterality: Right;  . APPLICATION OF WOUND VAC Right 01/11/2016   Procedure: APPLICATION OF WOUND VAC RIGHT CHEST ;  Surgeon: Loel Lofty Dillingham, DO;  Location: [place];  Service: Plastics;  Laterality: Right;  . APPLICATION OF WOUND VAC Right 01/24/2016   Procedure: APPLICATION OF WOUND VAC RIGHT CHEST WALL;  Surgeon: Loel Lofty Dillingham, DO;  Location: [place];  Service: Plastics;  Laterality: Right;  . APPLICATION OF WOUND VAC Right 02/12/2016   Procedure: RE-APPLICATION OF WOUND VAC TO RIGHT CHEST WOUND;  Surgeon: Loel Lofty Dillingham, DO;   Location: [place];  Service: Plastics;  Laterality: Right;  . BREAST BIOPSY Left 11/25/06   neg  . BREAST BIOPSY Left 01/20/12   /clip-neg  . CARDIAC CATHETERIZATION  11/2013   [facility]  . CARDIAC CATHETERIZATION  10/2014   [facility]  . CARDIAC VALVE REPLACEMENT    . CARDIOVERSION N/A 07/20/2018   Procedure: CARDIOVERSION;  Surgeon: [place], MD;  Location: [facility];  Service: Cardiovascular;  Laterality: N/A;  . CATARACT EXTRACTION W/ INTRAOCULAR LENS  IMPLANT, BILATERAL Bilateral 2014  . CLIPPING OF ATRIAL APPENDAGE N/A 04/25/2015   Procedure: CLIPPING OF ATRIAL APPENDAGE;  Surgeon: Rexene Alberts, MD;  Location: [place];  Service: Open Heart Surgery;  Laterality: N/A;  . COCHLEAR IMPLANT Left 2005?  . COLONOSCOPY WITH PROPOFOL N/A 01/20/2018   Procedure: COLONOSCOPY WITH PROPOFOL;  Surgeon: Tol[place], Benay Pike, MD;  Location: [facility];  Service: Gastroenterology;  Laterality: N/A;  . CORONARY ANGIOPLASTY    . CORONARY ARTERY BYPASS GRAFT N/A 04/25/2015  Procedure: CORONARY ARTERY BYPASS GRAFTING (CABG) x ONE, using left internal mammary artery;  Surgeon: Rexene Alberts, MD;  Location: [place];  Service: Open Heart Surgery;  Laterality: N/A;  . DILATION AND CURETTAGE OF UTERUS  "several before hysterectomy"  . EP IMPLANTABLE DEVICE N/A 05/02/2015   Procedure: Pacemaker Implant;  Surgeon: Thompson Grayer, MD;  Location: [place] [facility];  Service: Cardiovascular;  Laterality: N/A;  . ESOPHAGOGASTRODUODENOSCOPY (EGD) WITH PROPOFOL N/A 01/20/2018   Procedure: ESOPHAGOGASTRODUODENOSCOPY (EGD) WITH PROPOFOL;  Surgeon: Toledo, Benay Pike, MD;  Location: [facility];  Service: Gastroenterology;  Laterality: N/A;  . EYE SURGERY    . I&D EXTREMITY Right 12/06/2015   Procedure: IRRIGATION AND DEBRIDEMENT RIGHT CHEST WALL WITH ACELL PLACEMENT AND VAC;  Surgeon: Loel Lofty Dillingham, DO;  Location: [place];  Service: Plastics;  Laterality: Right;  . INCISION AND DRAINAGE OF WOUND N/A 11/21/2015   Procedure:  IRRIGATION AND DEBRIDEMENT WOUND;  Surgeon: Loel Lofty Dillingham, DO;  Location: [place];  Service: Plastics;  Laterality: N/A;  . INCISION AND DRAINAGE OF WOUND Right 12/21/2015   Procedure: IRRIGATION AND DEBRIDEMENT right chest wall WOUND;  Surgeon: Loel Lofty Dillingham, DO;  Location: [place];  Service: Plastics;  Laterality: Right;  right chest wall   . INCISION AND DRAINAGE OF WOUND Right 01/24/2016   Procedure: IRRIGATION AND DEBRIDEMENT RIGHT CHEST WALL WOUND;  Surgeon: Loel Lofty Dillingham, DO;  Location: [place];  Service: Plastics;  Laterality: Right;  . INCISION AND DRAINAGE OF WOUND Right 03/28/2016   Procedure: IRRIGATION AND DEBRIDEMENT RIGHT CHEST WALL WOUND WITH A Cell Placement;  Surgeon: Loel Lofty Dillingham, DO;  Location: [place];  Service: Plastics;  Laterality: Right;  . INCISION AND DRAINAGE OF WOUND Right 05/17/2016   Procedure: IRRIGATION AND DEBRIDEMENT OF RIGHT CHEST WOUND WITH A CELL PLACEMENT;  Surgeon: Loel Lofty Dillingham, DO;  Location: [place];  Service: Plastics;  Laterality: Right;  . INSERT / REPLACE / REMOVE PACEMAKER    . IRRIGATION AND DEBRIDEMENT OF WOUND WITH SPLIT THICKNESS SKIN GRAFT Right 01/11/2016   Procedure: IRRIGATION AND DEBRIDEMENT OF RIGHT CHEST WOUND ;  Surgeon: Loel Lofty Dillingham, DO;  Location: [place];  Service: Plastics;  Laterality: Right;  . MASTECTOMY, PARTIAL Right 2002   positive  . MAZE N/A 04/25/2015   Procedure: MAZE;  Surgeon: Rexene Alberts, MD;  Location: [place];  Service: Open Heart Surgery;  Laterality: N/A;  . MITRAL VALVE REPAIR N/A 04/25/2015   Procedure: MITRAL VALVE  REPLACEMENT using a 27 mm Edwards Perimount Magna Mitral Ease Valve;  Surgeon: Rexene Alberts, MD;  Location: [place];  Service: Open Heart Surgery;  Laterality: N/A;  . SKIN SPLIT GRAFT Right 02/12/2016   Procedure: IRRIGATION AND DEBRIDEMENT RIGHT CHEST WOUND;  Surgeon: Loel Lofty Dillingham, DO;  Location: [place];  Service: Plastics;  Laterality: Right;  . STERNAL WIRES REMOVAL N/A  06/05/2015   Procedure: STERNAL WIRES REMOVAL;  Surgeon: Rexene Alberts, MD;  Location: [place];  Service: Thoracic;  Laterality: N/A;  . STERNAL WOUND DEBRIDEMENT N/A 05/09/2015   Procedure: STERNAL WOUND DEBRIDEMENT;  Surgeon: Rexene Alberts, MD;  Location: [place];  Service: Thoracic;  Laterality: N/A;  . STERNAL WOUND DEBRIDEMENT N/A 11/13/2015   Procedure: Excisional drainage of RIGHT Chest wall mass and breast mass ;  Surgeon: Rexene Alberts, MD;  Location: [place];  Service: Thoracic;  Laterality: N/A;  . TEE WITHOUT CARDIOVERSION N/A 04/25/2015   Procedure: TRANSESOPHAGEAL ECHOCARDIOGRAM (TEE);  Surgeon: Rexene Alberts, MD;  Location: [place];  Service: Open Heart Surgery;  Laterality: N/A;  . TONSILLECTOMY    . TRAM Right 11/18/2015   Procedure: VRAM Vertical Rectus Abdominus Muscle Flap;  Surgeon: Loel Lofty Dillingham, DO;  Location: [place];  Service: Plastics;  Laterality: Right;  RIght Back  . TRICUSPID VALVE REPLACEMENT N/A 04/25/2015   Procedure: TRICUSPID VALVE REPAIR;  Surgeon: Rexene Alberts, MD;  Location: [place];  Service: Open Heart Surgery;  Laterality: N/A;  . VAGINAL HYSTERECTOMY      Prior to Admission medications   Medication Sig Start Date End Date Taking? Authorizing Provider  acetaminophen (TYLENOL) 325 MG tablet Take 2 tablets (650 mg total) by mouth every 4 (four) hours as needed for mild pain (or Fever >/= 101). 05/11/15   Barrett, Erin R, PA-C  AMBULATORY NON FORMULARY MEDICATION [employer] at Home Oxygen machine with tubing. DX: COPD DX Code: J44.9 01/15/17   Wilhelmina Mcardle, MD  amiodarone (PACERONE) 200 MG tablet TAKE 1 TABLET BY MOUTH EVERY DAY 11/06/18   Rise Mu, PA-C  aspirin EC 81 MG EC tablet Take 1 tablet (81 mg total) by mouth daily. 05/11/15   Barrett, Erin R, PA-C  azelastine (ASTELIN) 0.1 % nasal spray Place 1 spray into both nostrils 2 (two) times daily as needed for rhinitis or allergies.     [provider]  bisoprolol (ZEBETA) 5 MG tablet Take 1 tablet  (5 mg total) by mouth daily. 10/27/18   Deboraha Sprang, MD  ferrous sulfate 325 (65 FE) MG tablet Take 325 mg by mouth daily with breakfast.    [provider]  fluticasone (FLONASE) 50 MCG/ACT nasal spray Place 1 spray into both nostrils daily as needed for allergies.     [provider]  furosemide (LASIX) 40 MG tablet Take 0.5 tablets (20 mg total) by mouth 2 (two) times daily. 10/27/18   Deboraha Sprang, MD  gabapentin (NEURONTIN) 100 MG capsule Take 200 mg by mouth at bedtime.  09/29/15   [provider]  ipratropium-albuterol (DUONEB) 0.5-2.5 (3) MG/3ML SOLN Take 3 mLs by nebulization every 6 (six) hours as needed. DX:J44.9 Patient taking differently: Take 3 mLs by nebulization every 6 (six) hours as needed (for wheezing/shortness of breath).  10/28/17   Flora Lipps, MD  levothyroxine (SYNTHROID, LEVOTHROID) 88 MCG tablet Take 88 mcg by mouth daily before breakfast.    [provider]  Multiple Vitamins-Minerals (PRESERVISION AREDS 2 PO) Take 2 tablets by mouth daily.    [provider]  pantoprazole (PROTONIX) 40 MG tablet Take 40 mg by mouth 2 (two) times daily.     [provider]  sacubitril-valsartan (ENTRESTO) 24-26 MG Take 1 tablet by mouth 2 (two) times daily. 10/27/18   Deboraha Sprang, MD  simvastatin (ZOCOR) 10 MG tablet Take 1 tablet (10 mg total) by mouth at bedtime. 08/03/18 11/01/18  Rise Mu, PA-C  spironolactone (ALDACTONE) 25 MG tablet Take 0.5 tablets (12.5 mg total) by mouth daily. 09/17/18 12/16/18  Rise Mu, PA-C  warfarin (COUMADIN) 2.5 MG tablet TAKE AS DIRECTED BY COUMADIN CLINIC 11/09/18   Minna Merritts, MD  warfarin (COUMADIN) 3 MG tablet Take 1 tablet (3 mg total) by mouth one time only at 6 PM. 07/22/18   Loletha Grayer, MD    Allergies Ace inhibitors; Amoxicillin-pot clavulanate; Penicillins; Nifedipine; Propranolol; Diazepam; Meperidine; and Propoxyphene  Family History  Problem Relation Age of Onset   .  Heart disease Father   . Heart disease Brother   . CVA Mother   . Heart attack Paternal Uncle   . Breast cancer Maternal Aunt   . Breast cancer Paternal Aunt     Social History Social History   Tobacco Use  . Smoking status: Former Smoker    Packs/day: 1.50    Years: 40.00    Pack years: 60.00    Types: Cigarettes    Last attempt to quit: 04/20/1998    Years since quitting: 20.7  . Smokeless tobacco: Never Used  Substance Use Topics  . Alcohol use: Yes    Comment: 11/10/2015 "glass of wine a few times/year, if that"  . Drug use: No    Review of Systems Constitutional: No fever/chills. Positive for weight gain.  Eyes: No visual changes. ENT: No sore throat. Cardiovascular: Denies chest pain. Respiratory: Positive for shortness of breath.  Gastrointestinal: No abdominal pain.  No nausea, no vomiting.  No diarrhea.   Genitourinary: Negative for dysuria. Musculoskeletal: Negative for back pain. Skin: Negative for rash. Neurological: Negative for headaches, focal weakness or numbness.  ____________________________________________   PHYSICAL EXAM:  VITAL SIGNS: ED Triage Vitals  Enc Vitals Group     BP 01/25/19 1817 (!) 148/53     Pulse Rate 01/25/19 1817 67     Resp 01/25/19 1817 (!) 30     Temp 01/25/19 1817 98.1 F (36.7 C)     Temp Source 01/25/19 1817 Oral     SpO2 01/25/19 1817 (!) 85 %     Weight 01/25/19 1838 125 lb (56.7 kg)     Height 01/25/19 1838 5' 1.5" (1.562 m)     Head Circumference --      Peak Flow --      Pain Score 01/25/19 1818 0   Constitutional: Alert and oriented.  Eyes: Conjunctivae are normal.  ENT      Head: Normocephalic and atraumatic.      Nose: No congestion/rhinnorhea.      Mouth/Throat: Mucous membranes are moist.      Neck: No stridor. Hematological/Lymphatic/Immunilogical: No cervical lymphadenopathy. Cardiovascular: Normal rate, regular rhythm.  No murmurs, rubs, or gallops.  Respiratory: Normal respiratory effort  without tachypnea nor retractions. Breath sounds are clear and equal bilaterally. No wheezes/rales/rhonchi. Gastrointestinal: Soft and non tender. No rebound. No guarding.  Genitourinary: Deferred Musculoskeletal: Normal range of motion in all extremities. No lower extremity edema. Neurologic:  Normal speech and language. No gross focal neurologic deficits are appreciated.  Skin:  Skin is warm, dry and intact. No rash noted. Psychiatric: Mood and affect are normal. Speech and behavior are normal. Patient exhibits appropriate insight and judgment.  ____________________________________________    LABS (pertinent positives/negatives)  Trop <0.03 BMP na 142, k 3.2, glu 135, cr 1.04 INR 3.0 CBC wbc 10.0, hgb 11.3, plt 247  ____________________________________________   EKG  I, Nance Pear, attending physician, personally viewed and interpreted this EKG  EKG Time: 1820 Rate: 64 Rhythm: atrial ventricular dual paced rhythm Axis: rightward axis Intervals: qtc 550 QRS: wide ST changes: no st elevation equivalent Impression: abnormal ekg ____________________________________________    RADIOLOGY  CXR Mild congestive heart failure ____________________________________________   PROCEDURES  Procedures  ____________________________________________   INITIAL IMPRESSION / ASSESSMENT AND PLAN / ED COURSE  Pertinent labs & imaging results that were available during my care of the patient were reviewed by me and considered in my medical decision making (see chart for details).   Patient presented to the emergency  department today because of concerns for shortness of breath.  Patient does have a history of both COPD, CHF, ACS amongst other etiologies.  Chest x-ray is consistent with mild CHF and this would be consistent with patient's reported history of some weight gain.  She was given IV Lasix here with good output.  Did ambulate the patient she did have some desaturation.  At  this point I had a discussion with the patient.  Did discuss admission given hypoxia with ambulation.  Patient however did not want to be admitted.  She does have oxygen at home.  I do think is reasonable to discharge patient home given that she would like to be discharged home and has oxygen available.  We did discuss portance of using that oxygen throughout the day.  Also discussed importance of close follow-up with PCP or pulmonologist.   ____________________________________________   FINAL CLINICAL IMPRESSION(S) / ED DIAGNOSES  Final diagnoses:  Acute on chronic congestive heart failure, unspecified heart failure type ([place])  Shortness of breath     Note: This dictation was prepared with Dragon dictation. Any transcriptional errors that result from this process are unintentional     Nance Pear, MD 01/25/19 [phone]

## 2019-01-25 NOTE — Telephone Encounter (Signed)
I'm concerned about her progressive dyspnea (now @ rest despite O2), hypoxia, and cough.  It appears her weight is stable, if not down some.  I think she needs to be seen tonight due to hypoxia w/ sats reported in the low 80's.  She would benefit from ER visit.

## 2019-01-25 NOTE — ED Notes (Signed)
Patient assisted to restroom.  

## 2019-01-25 NOTE — ED Notes (Signed)
When ambulating to the restroom, patient's oxygen saturations dropped to 81% on RA. After placing patient on 1L O2 in bed, patient now at 96%

## 2019-01-25 NOTE — ED Notes (Signed)
Patient placed on 2L O2. O2 sats now 99%

## 2019-01-25 NOTE — Telephone Encounter (Signed)
Please call regarding Entresto. Pt states it is making her cough.

## 2019-01-25 NOTE — ED Triage Notes (Signed)
Pt c/o SOB with cough for the past 2 days, states is on home O2 at night only. Pt is tachypneic on arrival. RA 85%, pt taken back to room 6 and EDP and RN notified.

## 2019-01-25 NOTE — Discharge Instructions (Addendum)
Please seek medical attention for any high fevers, chest pain, shortness of breath, change in behavior, persistent vomiting, bloody stool or any other new or concerning symptoms.  

## 2019-01-25 NOTE — Telephone Encounter (Signed)
Returned call to patient with suggestion from provider. She verbalized understanding and will report to ED for further work up.   Called ED to make them aware patient coming and brief hx.

## 2019-01-26 ENCOUNTER — Encounter: Payer: Medicare Other | Admitting: Internal Medicine

## 2019-01-26 ENCOUNTER — Other Ambulatory Visit: Payer: Medicare Other

## 2019-01-26 NOTE — ED Notes (Signed)
Asked patient if she wanted to wait until her husband could bring her oxygen concentrator but patient refused. Patient states she will be okay for ride home. Discharge paperwork reviewed, questions addressed.

## 2019-01-27 ENCOUNTER — Telehealth: Payer: Self-pay

## 2019-01-27 NOTE — Telephone Encounter (Signed)
Call to patient, seen in ED after triage call on Monday for SOB and o2 desaturation.   Pt reports that she is feeling much better. Is now wearing 2 L o2 during the daytime.   Patient contacted regarding ED visit on 01/25/19.   Patient understands to follow up with provider  Jamie Faith, PA on 02/04/19 at 3:30 PM at [facility]. Patient understands discharge instructions? yes  Patient understands medications and regiment? yes Patient understands to bring all medications to this visit? Yes  Pt screened for CV19, she has cough which has been ongoing d/t medication. No fevers, GI upset, travel, no exposure to CV 19.  Tested for CV19 in the ED, which came back negative.  Reviewed procedure for OV. Pt verbalized understanding and had no further questions at this time.

## 2019-01-27 NOTE — Telephone Encounter (Signed)
-----   Message from Theora Gianotti, NP sent at 01/27/2019 12:26 PM EDT ----- She'd be a good one to see in office within the next week or so.  ER visits are basically TOC's, though we can't bill them that way. ----- Message ----- From: Alroy Dust.Daleen Bo, RN Sent: 01/27/2019  11:12 AM EDT To: Theora Gianotti, NP  She currently does not have provider f/u until July. When would you suggest she have f/u based on ED record? Thaniel Coluccio

## 2019-01-29 ENCOUNTER — Ambulatory Visit: Payer: Medicare Other | Admitting: Family

## 2019-01-31 ENCOUNTER — Other Ambulatory Visit: Payer: Self-pay | Admitting: Cardiovascular Disease

## 2019-02-02 NOTE — Telephone Encounter (Signed)
Please review for refill.  

## 2019-02-03 NOTE — H&P (View-Only) (Signed)
Cardiology Office Note Date:  02/04/2019  Patient ID:  [name], [name] 09-30-1934, MRN [id number] PCP:  Ezequiel Kayser, MD  Cardiologist:  Dr. Fletcher Anon, MD Electrophysiologist: Dr. Caryl Comes, MD    Chief Complaint: Follow up  History of Present Illness: [name] is a 84 y.o. female with history of CAD s/p CABG x 1 with a LIMA to LAD in 04/2015, PAF onCoumadin, mitral valve stenosis and tricuspid regurgitation s/p mitral valve replacement, tricuspid valve replacement, MAZE procedure, and left atrial appendage clipping in 11/7856 complicated by sternal wound infection and complete heart block requiring MDT PPM implantation, chronic combined CHF, COPD on nocturnal oxygen, pulmonary hypertension, anemia, Meniere's disease, HTN, HLD,andhypothyroidismwho presents for ED follow-up of shortness of breath.  She was diagnosed with Afiband acute systolic CHFin 04/5026 with an initial EF of 35-40%. Cardiac cath revealed moderate proximal LAD disease with TEE revealing moderate tricuspid regurgitation, PAH, and moderate to severe MR. She underwent DCCV at that time. Follow up echo showed persistent severe MR despite improvement in LV function. She was subsequently referred to thoracic surgery and underwent mitral valve replacement with a bovine valve, tricuspid valve repair, MAZE procedure, left atrial appendage clipping, and CABG x 1 with a LIMA to LAD.Postoperative course was complicatedby sternal wound infection and complete heart block requiring [employer] permanent pacemaker. She was subsequently discharged on amiodarone and Coumadin in the setting of PAFand amiodarone was eventually discontinued. She has been on chronic Coumadin since. In 08/2017, she was seen with complaints of increasing lowerextremityswelling and abdominal girth. Follow-up echo again showed LV dysfunction with an EF of 30 to 35%.She was seen by EP and it was felt that LV dysfunction was in the context of RV apical pacing. That was  inactivated and she noted immediate improvement in symptoms.Follow-up echocardiogram in1/2018 showed improvement in LV function with an EF of 50 to 55% and normal functioning valves.She was seen by EP in 05/2018 noting some exercise intolerance with symptoms improving following adjustments in her PPM. However, she was seen on 07/17/18 in the office noting a 3 week history of DOE and fatigue. She noted some tachy-palpitations and ankle edema. Echo performed that day showed an EF of 20-25%, mild concentric LVH, normal functioning prosthetic mitral valve, mildly dilated LA, moderately dilated RV with moderately reduced RVSF. She was noted to be in Afib with RVR with ventricular rates in the 120s bpm. She was sent to the ED and subsequently admitted, diuresed, and rate controlled. She was loaded with IV amiodarone and underwent successful DCCV on 07/20/18 to an AV paced rhythm.  She was seen in hospital follow-up on 08/03/2018 and was doing very well.  She reported she had not felt well for many months.  There were no symptoms concerning for recurrence of A. fib.  She underwent repeat echo on 08/24/2017 that showed an EF of 35 to 40%, diffuse hypokinesis, mild concentric LVH, bioprosthetic mitral valve was present and functioning normally with a mean gradient of 4 mmHg, left atrium is mildly dilated, right ventricular is mildly dilated with mildly reduced RV systolic function, PASP could not be accurately estimated, trivial pericardial effusion was noted.  Compared to echo from 1 month prior EF had improved from 20 to 25% to 35 to 40%. In follow up in 09/2018, she continued to feel well. She underwent repeat echo in 10/2018 per EP which showed an EF of 35-40%, possible septal wall HK, mildly reduced RVSF with mildly enlarged RV cavity, moderate biatrial enlargement. In follow  up with EP afterwards, she was changed from losartan to Entresto, bisoprolol was decreased, and Lasix was doubled to 20 mg bid. Repeat echo was  recommended in 3 months with plans for CRT if her EF does not improve.   She was seen in the ED on 01/25/2019 with complaints of a dry cough as well as hypoxia with O2 saturations in the low 80s to 90s. Work up in the ED showed a negative COVID-19 test, troponin negative x 1, potassium 3.2, SCr 1.04 with a baseline around 0.7-0.9, WBC 10, HGB 11.3, INR 3.0, CXR suggestive of mild CHF, EKG paced. She was given IV Lasix with some improvement in symptoms, though continued desaturation with ambulation. She declined admission.   Patient comes in accompanied by her husband today.  They both indicate following initiation of Entresto from losartan the patient did remarkably well and noted an improvement in her functional status/energy level.  She has been able to participate in low impact aerobics for the full hour duration.  More recently, approximately 2 to 3 weeks prior she began to notice a dry cough with associated increased shortness of breath.  She noted her oxygen saturations during the day would be in the low 90s at rest and dip into the mid 80s with ambulation on room air.  In this setting, she started wearing her nocturnal oxygen during the day at 2 L with improvement in saturations at rest and with exertion.  Following her recent ED evaluation on 01/25/2019 she has continued to note some shortness of breath though this is improved.  She continues to wear her oxygen 24/7 at 2 L with oxygen saturations now in the mid 90s at rest and in the upper 80s with ambulation on room air.  Patient and husband do indicate leading up to her ED visit on 5/18 they had been adding salt to food though have subsequently discontinued this.  She indicates her lower extremity swelling is improved when compared to mid May.  Her weight has been stable at home ranging between 121 and 123 pounds.  She denies any orthopnea, PND, or early satiety.  No chest pain, palpitations, dizziness, presyncope, or syncope.  She denies any fevers,  chills, or known COVID exposures.   Past Medical History:  Diagnosis Date   Anxiety    Arthritis    "hands" (11/10/2015)   CAD s/p CABG x 1    a. 04/2015 LIMA to LAD   Chronic combined systolic (congestive) and diastolic (congestive) heart failure ([place])    a. 01/2015 Echo: EF 50-55%, Gr2 DD (preop valve surgery); b. 08/2015 Echo: EF 30-35%, diff HK. Gr2 DD; c. 09/2016 Echo: EF 50-55%. Nl fxning TV ring. Mildly dil LA.   COPD (chronic obstructive pulmonary disease) (HCC)    Coronary artery disease    GERD (gastroesophageal reflux disease)    History of colon polyps    History of mitral valve replacement    a. 04/2015 s/p 27 mm [facility] Mitral bovine bioprosthetic tissue valve   Hyperlipidemia    Hypertension    Hypothyroidism    Maze operation for AF w/ LAA clipping    a. 04/2015 Complete bilateral atrial lesion set using cryothermy and bipolar radiofrequency ablation with clipping of LA appendage   Meniere disease    Meniere's disease    On home oxygen therapy    "2L at night" (11/10/2015)   PAF (paroxysmal atrial fibrillation) ([place])    Pneumonia ~ 2010   PONV (postoperative nausea and vomiting)  Pt felt like eardrums were gonna pop   Post-surgical complete heart block, symptomatic    a. 04/2015 s/p MDT [id number] Advisa DR MRI DC PPM.   Presence of permanent cardiac pacemaker    Pulmonary hypertension ([place])    Surgical wound, non healing - chest wall 11/10/2015   Tricuspid Regurgitation s/p Repair    a. 04/2015 s/p 26 mm Edwards mc3 ring annuloplasty   Wound infection 10/13/2015   Superficial sternal wound infection    Past Surgical History:  Procedure Laterality Date   APPENDECTOMY     APPLICATION OF A-CELL OF CHEST/ABDOMEN N/A 11/21/2015   Procedure: APPLICATION OF A-CELL OF CHEST/ABDOMEN;  Surgeon: Loel Lofty Dillingham, DO;  Location: [place];  Service: Plastics;  Laterality: N/A;   APPLICATION OF A-CELL OF CHEST/ABDOMEN Right 12/21/2015   Procedure:  APPLICATION OF A-CELL OF RIGHT CHEST;  Surgeon: Loel Lofty Dillingham, DO;  Location: [place];  Service: Plastics;  Laterality: Right;   APPLICATION OF A-CELL OF CHEST/ABDOMEN Right 01/11/2016   Procedure: APPLICATION OF A-CELL OF RIGHT CHEST;  Surgeon: Loel Lofty Dillingham, DO;  Location: [place];  Service: Plastics;  Laterality: Right;   APPLICATION OF A-CELL OF CHEST/ABDOMEN Right 02/12/2016   Procedure: APPLICATION OF A-CELL TO RIGHT CHEST WOUND;  Surgeon: Loel Lofty Dillingham, DO;  Location: [place];  Service: Plastics;  Laterality: Right;   APPLICATION OF WOUND VAC N/A 05/09/2015   Procedure: APPLICATION OF WOUND VAC;  Surgeon: Rexene Alberts, MD;  Location: [place];  Service: Thoracic;  Laterality: N/A;   APPLICATION OF WOUND VAC N/A 11/13/2015   Procedure: APPLICATION OF WOUND VAC;  Surgeon: Rexene Alberts, MD;  Location: [place];  Service: Thoracic;  Laterality: N/A;   APPLICATION OF WOUND VAC N/A 11/21/2015   Procedure: APPLICATION OF WOUND VAC;  Surgeon: Loel Lofty Dillingham, DO;  Location: [place];  Service: Plastics;  Laterality: N/A;   APPLICATION OF WOUND VAC Right 12/21/2015   Procedure: APPLICATION OF WOUND VAC to right chest wall ;  Surgeon: Loel Lofty Dillingham, DO;  Location: [place];  Service: Plastics;  Laterality: Right;   APPLICATION OF WOUND VAC Right 01/11/2016   Procedure: APPLICATION OF WOUND VAC RIGHT CHEST ;  Surgeon: Loel Lofty Dillingham, DO;  Location: [place];  Service: Plastics;  Laterality: Right;   APPLICATION OF WOUND VAC Right 01/24/2016   Procedure: APPLICATION OF WOUND VAC RIGHT CHEST WALL;  Surgeon: Loel Lofty Dillingham, DO;  Location: [place];  Service: Plastics;  Laterality: Right;   APPLICATION OF WOUND VAC Right 02/12/2016   Procedure: RE-APPLICATION OF WOUND VAC TO RIGHT CHEST WOUND;  Surgeon: Loel Lofty Dillingham, DO;  Location: [place];  Service: Plastics;  Laterality: Right;   BREAST BIOPSY Left 11/25/06   neg   BREAST BIOPSY Left 01/20/12   /clip-neg   CARDIAC  CATHETERIZATION  11/2013   [facility]   CARDIAC CATHETERIZATION  10/2014   [facility]   CARDIAC VALVE REPLACEMENT     CARDIOVERSION N/A 07/20/2018   Procedure: CARDIOVERSION;  Surgeon: Wellington Hampshire, MD;  Location: [facility];  Service: Cardiovascular;  Laterality: N/A;   CATARACT EXTRACTION W/ INTRAOCULAR LENS  IMPLANT, BILATERAL Bilateral 2014   CLIPPING OF ATRIAL APPENDAGE N/A 04/25/2015   Procedure: CLIPPING OF ATRIAL APPENDAGE;  Surgeon: Rexene Alberts, MD;  Location: [place];  Service: Open Heart Surgery;  Laterality: N/A;   COCHLEAR IMPLANT Left 2005?   COLONOSCOPY WITH PROPOFOL N/A 01/20/2018   Procedure: COLONOSCOPY WITH PROPOFOL;  Surgeon: [place],  Benay Pike, MD;  Location: [facility];  Service: Gastroenterology;  Laterality: N/A;   CORONARY ANGIOPLASTY     CORONARY ARTERY BYPASS GRAFT N/A 04/25/2015   Procedure: CORONARY ARTERY BYPASS GRAFTING (CABG) x ONE, using left internal mammary artery;  Surgeon: Rexene Alberts, MD;  Location: [place];  Service: Open Heart Surgery;  Laterality: N/A;   DILATION AND CURETTAGE OF UTERUS  "several before hysterectomy"   EP IMPLANTABLE DEVICE N/A 05/02/2015   Procedure: Pacemaker Implant;  Surgeon: Thompson Grayer, MD;  Location: [place] [facility];  Service: Cardiovascular;  Laterality: N/A;   ESOPHAGOGASTRODUODENOSCOPY (EGD) WITH PROPOFOL N/A 01/20/2018   Procedure: ESOPHAGOGASTRODUODENOSCOPY (EGD) WITH PROPOFOL;  Surgeon: [place], Benay Pike, MD;  Location: [facility];  Service: Gastroenterology;  Laterality: N/A;   EYE SURGERY     I&D EXTREMITY Right 12/06/2015   Procedure: IRRIGATION AND DEBRIDEMENT RIGHT CHEST WALL WITH ACELL PLACEMENT AND VAC;  Surgeon: Loel Lofty Dillingham, DO;  Location: [place];  Service: Plastics;  Laterality: Right;   INCISION AND DRAINAGE OF WOUND N/A 11/21/2015   Procedure: IRRIGATION AND DEBRIDEMENT WOUND;  Surgeon: Loel Lofty Dillingham, DO;  Location: [place];  Service: Plastics;  Laterality: N/A;   INCISION AND DRAINAGE  OF WOUND Right 12/21/2015   Procedure: IRRIGATION AND DEBRIDEMENT right chest wall WOUND;  Surgeon: Loel Lofty Dillingham, DO;  Location: [place];  Service: Plastics;  Laterality: Right;  right chest wall    INCISION AND DRAINAGE OF WOUND Right 01/24/2016   Procedure: IRRIGATION AND DEBRIDEMENT RIGHT CHEST WALL WOUND;  Surgeon: Loel Lofty Dillingham, DO;  Location: [place];  Service: Plastics;  Laterality: Right;   INCISION AND DRAINAGE OF WOUND Right 03/28/2016   Procedure: IRRIGATION AND DEBRIDEMENT RIGHT CHEST WALL WOUND WITH A Cell Placement;  Surgeon: Loel Lofty Dillingham, DO;  Location: [place];  Service: Plastics;  Laterality: Right;   INCISION AND DRAINAGE OF WOUND Right 05/17/2016   Procedure: IRRIGATION AND DEBRIDEMENT OF RIGHT CHEST WOUND WITH A CELL PLACEMENT;  Surgeon: Loel Lofty Dillingham, DO;  Location: [place];  Service: Plastics;  Laterality: Right;   INSERT / REPLACE / REMOVE PACEMAKER     IRRIGATION AND DEBRIDEMENT OF WOUND WITH SPLIT THICKNESS SKIN GRAFT Right 01/11/2016   Procedure: IRRIGATION AND DEBRIDEMENT OF RIGHT CHEST WOUND ;  Surgeon: Loel Lofty Dillingham, DO;  Location: [place];  Service: Plastics;  Laterality: Right;   MASTECTOMY, PARTIAL Right 2002   positive   MAZE N/A 04/25/2015   Procedure: MAZE;  Surgeon: Rexene Alberts, MD;  Location: [place];  Service: Open Heart Surgery;  Laterality: N/A;   MITRAL VALVE REPAIR N/A 04/25/2015   Procedure: MITRAL VALVE  REPLACEMENT using a 27 mm Edwards Perimount Magna Mitral Ease Valve;  Surgeon: Rexene Alberts, MD;  Location: [place];  Service: Open Heart Surgery;  Laterality: N/A;   SKIN SPLIT GRAFT Right 02/12/2016   Procedure: IRRIGATION AND DEBRIDEMENT RIGHT CHEST WOUND;  Surgeon: Loel Lofty Dillingham, DO;  Location: [place];  Service: Plastics;  Laterality: Right;   STERNAL WIRES REMOVAL N/A 06/05/2015   Procedure: STERNAL WIRES REMOVAL;  Surgeon: Rexene Alberts, MD;  Location: [place];  Service: Thoracic;  Laterality: N/A;   STERNAL WOUND  DEBRIDEMENT N/A 05/09/2015   Procedure: STERNAL WOUND DEBRIDEMENT;  Surgeon: Rexene Alberts, MD;  Location: [place];  Service: Thoracic;  Laterality: N/A;   STERNAL WOUND DEBRIDEMENT N/A 11/13/2015   Procedure: Excisional drainage of RIGHT Chest wall mass and breast mass ;  Surgeon: Rexene Alberts, MD;  Location: [place];  Service: Thoracic;  Laterality: N/A;   TEE WITHOUT CARDIOVERSION N/A 04/25/2015   Procedure: TRANSESOPHAGEAL ECHOCARDIOGRAM (TEE);  Surgeon: Rexene Alberts, MD;  Location: [place];  Service: Open Heart Surgery;  Laterality: N/A;   TONSILLECTOMY     TRAM Right 11/18/2015   Procedure: VRAM Vertical Rectus Abdominus Muscle Flap;  Surgeon: Loel Lofty Dillingham, DO;  Location: [place];  Service: Plastics;  Laterality: Right;  RIght Back   TRICUSPID VALVE REPLACEMENT N/A 04/25/2015   Procedure: TRICUSPID VALVE REPAIR;  Surgeon: Rexene Alberts, MD;  Location: [place];  Service: Open Heart Surgery;  Laterality: N/A;   VAGINAL HYSTERECTOMY      Current Meds  Medication Sig   acetaminophen (TYLENOL) 325 MG tablet Take 2 tablets (650 mg total) by mouth every 4 (four) hours as needed for mild pain (or Fever >/= 101).   AMBULATORY NON FORMULARY MEDICATION Inogen at Home Oxygen machine with tubing. DX: COPD DX Code: J44.9   amiodarone (PACERONE) 200 MG tablet TAKE 1 TABLET BY MOUTH EVERY DAY   aspirin EC 81 MG EC tablet Take 1 tablet (81 mg total) by mouth daily.   azelastine (ASTELIN) 0.1 % nasal spray Place 1 spray into both nostrils 2 (two) times daily as needed for rhinitis or allergies.    bisoprolol (ZEBETA) 5 MG tablet Take 1 tablet (5 mg total) by mouth daily.   ferrous sulfate 325 (65 FE) MG tablet Take 325 mg by mouth daily with breakfast.   fluticasone (FLONASE) 50 MCG/ACT nasal spray Place 1 spray into both nostrils daily as needed for allergies.    furosemide (LASIX) 40 MG tablet Take 0.5 tablets (20 mg total) by mouth 2 (two) times daily.   gabapentin (NEURONTIN) 100 MG  capsule Take 200 mg by mouth at bedtime.    ipratropium-albuterol (DUONEB) 0.5-2.5 (3) MG/3ML SOLN Take 3 mLs by nebulization every 6 (six) hours as needed. DX:J44.9 (Patient taking differently: Take 3 mLs by nebulization every 6 (six) hours as needed (for wheezing/shortness of breath). )   levothyroxine (SYNTHROID, LEVOTHROID) 88 MCG tablet Take 88 mcg by mouth daily before breakfast.   Multiple Vitamins-Minerals (PRESERVISION AREDS 2 PO) Take 2 tablets by mouth daily.   pantoprazole (PROTONIX) 40 MG tablet Take 40 mg by mouth 2 (two) times daily.    sacubitril-valsartan (ENTRESTO) 24-26 MG Take 1 tablet by mouth 2 (two) times daily.   simvastatin (ZOCOR) 10 MG tablet Take 1 tablet (10 mg total) by mouth at bedtime.   spironolactone (ALDACTONE) 25 MG tablet Take 0.5 tablets (12.5 mg total) by mouth daily.   warfarin (COUMADIN) 2.5 MG tablet TAKE AS DIRECTED BY COUMADIN CLINIC   warfarin (COUMADIN) 3 MG tablet Take 1 tablet (3 mg total) by mouth one time only at 6 PM.    Allergies:   Ace inhibitors; Amoxicillin-pot clavulanate; Penicillins; Nifedipine; Propranolol; Diazepam; Meperidine; and Propoxyphene   Social History:  The patient  reports that she quit smoking about 20 years ago. Her smoking use included cigarettes. She has a 60.00 pack-year smoking history. She has never used smokeless tobacco. She reports current alcohol use. She reports that she does not use drugs.   Family History:  The patient's family history includes Breast cancer in her maternal aunt and paternal aunt; CVA in her mother; Heart attack in her paternal uncle; Heart disease in her brother and father.  ROS:   Review of Systems  Constitutional: Positive for malaise/fatigue.  Negative for chills, diaphoresis, fever and weight loss.  HENT: Negative for congestion.   Eyes: Negative for discharge and redness.  Respiratory: Positive for cough and shortness of breath. Negative for hemoptysis, sputum production and  wheezing.        Improving shortness of breath and cough  Cardiovascular: Positive for leg swelling. Negative for chest pain, palpitations, orthopnea, claudication and PND.  Gastrointestinal: Negative for abdominal pain, blood in stool, heartburn, melena, nausea and vomiting.  Genitourinary: Negative for hematuria.  Musculoskeletal: Negative for falls and myalgias.  Skin: Negative for rash.  Neurological: Positive for weakness. Negative for dizziness, tingling, tremors, sensory change, speech change, focal weakness and loss of consciousness.  Endo/Heme/Allergies: Does not bruise/bleed easily.  Psychiatric/Behavioral: Negative for substance abuse. The patient is not nervous/anxious.   All other systems reviewed and are negative.    PHYSICAL EXAM:  VS:  BP 124/64 (BP Location: Left Arm, Patient Position: Sitting, Cuff Size: Normal)    Pulse 74    Ht 5' 1.5" (1.562 m)    Wt 121 lb (54.9 kg)    BMI 22.49 kg/m  BMI: Body mass index is 22.49 kg/m.  Physical Exam  Constitutional: She is oriented to person, place, and time. She appears well-developed and well-nourished.  HENT:  Head: Normocephalic and atraumatic.  Eyes: Right eye exhibits no discharge. Left eye exhibits no discharge.  Neck: Normal range of motion. No JVD present.  Cardiovascular: Normal rate, regular rhythm, S1 normal, S2 normal and normal heart sounds. Exam reveals no distant heart sounds, no friction rub, no midsystolic click and no opening snap.  No murmur heard. Pulmonary/Chest: Effort normal. No respiratory distress. She has decreased breath sounds in the left lower field. She has no wheezes. She has no rhonchi. She has no rales. She exhibits no tenderness.  Supplemental oxygen noted via nasal cannula flowing at 2 L  Abdominal: Soft. She exhibits no distension. There is no abdominal tenderness.  Musculoskeletal:        General: Edema present.     Comments: Trace bilateral ankle edema with the right ankle being worse than  the left  Neurological: She is alert and oriented to person, place, and time.  Skin: Skin is warm and dry. No cyanosis. Nails show no clubbing.  Psychiatric: She has a normal mood and affect. Her speech is normal and behavior is normal. Judgment and thought content normal.     EKG:  Was ordered and interpreted by me today. Shows AV paced rhythm, 61 bpm  Recent Labs: 05/13/2018: Magnesium 2.2 07/17/2018: B Natriuretic Peptide 477.0 10/26/2018: ALT 20; TSH 2.860 01/25/2019: BUN 30; Creatinine, Ser 1.04; Hemoglobin 11.3; Platelets 247; Potassium 3.2; Sodium 142  No results found for requested labs within last 8760 hours.   Estimated Creatinine Clearance: 32.3 mL/min (A) (by C-G formula based on SCr of 1.04 mg/dL (H)).   Wt Readings from Last 3 Encounters:  02/04/19 121 lb (54.9 kg)  01/25/19 125 lb (56.7 kg)  10/27/18 128 lb (58.1 kg)     Other studies reviewed: Additional studies/records reviewed today include: summarized above  ASSESSMENT AND PLAN:  1. Acute on chronic hypoxic respiratory failure: Improving and likely multifactorial including combined CHF, COPD, and pulmonary hypertension.  Oxygen saturations are improved today when compared to recent readings with an ambulatory oxygen saturation of 91% on 2 L oxygen via nasal cannula and 88% ambulatory oxygen saturation on room air.  Resting oxygen saturation in the mid 90s.  Discussed potential ED evaluation with the  patient and husband and given the improvement noted with the patient we have agreed to defer this at this time and assess her labs through the [facility] with follow-up recommendations pending these values.  She has been advised to contact her pulmonologist for further recommendations as well as I cannot exclude an underlying pulmonary component.  2. Chronic combined CHF secondary to mixed ICM and NICM: She does not appear grossly volume overloaded on exam today.  There are no crackles on her exam, JVD is not elevated, there  is a stable trace bilateral ankle edema, and her weight is down approximately 6 to 8 pounds from her most recent office visits.  Most recent labs indicate a prerenal state.  In the above setting, I am hesitant to escalate her diuretic therapy any further at this time.  We will check a BMP with further recommendations pending.  For now, she will continue Entresto, bisoprolol, and spironolactone.  Await input from EP regarding potential CRT upgrade.  3. CAD involving the native coronary arteries status post CABG without angina: No symptoms of angina at this time.  Continue aspirin, bisoprolol, and simvastatin.  If her dyspnea persists we may need to repeat ischemic evaluation.  4. Pulmonary hypertension: Likely in the setting of underlying COPD.  This too is likely playing a role in her dyspnea.  Following trending of her renal function and appropriate adjustment and diuretic therapy, should symptoms persist she may benefit from right heart catheterization to further estimate her volume status.  5. PAF: Currently in an AV paced rhythm.  Continue bisoprolol and amiodarone per EP.  Recent TSH and liver function normal.  Coumadin managed by Coumadin clinic and therapeutic.  6. AKI: Labs from the ED visit on 01/25/2019 appear to show a prerenal state with elevated BUN and serum creatinine.  Check BMP today to trend.  7. Valvular heart disease: Stable on most recent echo.  Continue to monitor.  Remains on Coumadin and aspirin as outlined above.  8. Complete heart block: Status post MDT PPM.  EP is evaluating for potential CRT upgrade as outlined above.  9. COPD: She does not appear to be an acute exacerbation though I do suspect this is playing a role in some of her dyspnea as outlined above.  I have advised her to contact her pulmonologist for follow-up.  10. Hypertension: Blood pressure is well controlled.  Continue current medications including bisoprolol, Lasix, Entresto, and  spironolactone.  11. Hyperlipidemia: LDL of 56 from 07/2018.  Tolerating low-dose simvastatin.  Disposition: F/u with Dr. Fletcher Anon or an APP in 1 month and EP as directed next month.  Current medicines are reviewed at length with the patient today.  The patient did not have any concerns regarding medicines.  Signed, Christell Faith, PA-C 02/04/2019 4:18 PM     [place] [address] [place], [place] [id number] [phone]

## 2019-02-03 NOTE — Progress Notes (Signed)
Cardiology Office Note Date:  02/04/2019  Patient ID:  [name], [name] 1935/06/04, MRN [id number] PCP:  Ezequiel Kayser, MD  Cardiologist:  Dr. Fletcher Anon, MD Electrophysiologist: Dr. Caryl Comes, MD    Chief Complaint: Follow up  History of Present Illness: GAVRIELA CASHIN is a 84 y.o. female with history of CAD s/p CABG x 1 with a LIMA to LAD in 04/2015, PAF onCoumadin, mitral valve stenosis and tricuspid regurgitation s/p mitral valve replacement, tricuspid valve replacement, MAZE procedure, and left atrial appendage clipping in 09/8839 complicated by sternal wound infection and complete heart block requiring MDT PPM implantation, chronic combined CHF, COPD on nocturnal oxygen, pulmonary hypertension, anemia, Meniere's disease, HTN, HLD,andhypothyroidismwho presents for ED follow-up of shortness of breath.  She was diagnosed with Afiband acute systolic CHFin 02/6062 with an initial EF of 35-40%. Cardiac cath revealed moderate proximal LAD disease with TEE revealing moderate tricuspid regurgitation, PAH, and moderate to severe MR. She underwent DCCV at that time. Follow up echo showed persistent severe MR despite improvement in LV function. She was subsequently referred to thoracic surgery and underwent mitral valve replacement with a bovine valve, tricuspid valve repair, MAZE procedure, left atrial appendage clipping, and CABG x 1 with a LIMA to LAD.Postoperative course was complicatedby sternal wound infection and complete heart block requiring [employer] permanent pacemaker. She was subsequently discharged on amiodarone and Coumadin in the setting of PAFand amiodarone was eventually discontinued. She has been on chronic Coumadin since. In 08/2017, she was seen with complaints of increasing lowerextremityswelling and abdominal girth. Follow-up echo again showed LV dysfunction with an EF of 30 to 35%.She was seen by EP and it was felt that LV dysfunction was in the context of RV apical pacing. That was  inactivated and she noted immediate improvement in symptoms.Follow-up echocardiogram in1/2018 showed improvement in LV function with an EF of 50 to 55% and normal functioning valves.She was seen by EP in 05/2018 noting some exercise intolerance with symptoms improving following adjustments in her PPM. However, she was seen on 07/17/18 in the office noting a 3 week history of DOE and fatigue. She noted some tachy-palpitations and ankle edema. Echo performed that day showed an EF of 20-25%, mild concentric LVH, normal functioning prosthetic mitral valve, mildly dilated LA, moderately dilated RV with moderately reduced RVSF. She was noted to be in Afib with RVR with ventricular rates in the 120s bpm. She was sent to the ED and subsequently admitted, diuresed, and rate controlled. She was loaded with IV amiodarone and underwent successful DCCV on 07/20/18 to an AV paced rhythm.  She was seen in hospital follow-up on 08/03/2018 and was doing very well.  She reported she had not felt well for many months.  There were no symptoms concerning for recurrence of A. fib.  She underwent repeat echo on 08/24/2017 that showed an EF of 35 to 40%, diffuse hypokinesis, mild concentric LVH, bioprosthetic mitral valve was present and functioning normally with a mean gradient of 4 mmHg, left atrium is mildly dilated, right ventricular is mildly dilated with mildly reduced RV systolic function, PASP could not be accurately estimated, trivial pericardial effusion was noted.  Compared to echo from 1 month prior EF had improved from 20 to 25% to 35 to 40%. In follow up in 09/2018, she continued to feel well. She underwent repeat echo in 10/2018 per EP which showed an EF of 35-40%, possible septal wall HK, mildly reduced RVSF with mildly enlarged RV cavity, moderate biatrial enlargement. In follow  up with EP afterwards, she was changed from losartan to Entresto, bisoprolol was decreased, and Lasix was doubled to 20 mg bid. Repeat echo was  recommended in 3 months with plans for CRT if her EF does not improve.   She was seen in the ED on 01/25/2019 with complaints of a dry cough as well as hypoxia with O2 saturations in the low 80s to 90s. Work up in the ED showed a negative COVID-19 test, troponin negative x 1, potassium 3.2, SCr 1.04 with a baseline around 0.7-0.9, WBC 10, HGB 11.3, INR 3.0, CXR suggestive of mild CHF, EKG paced. She was given IV Lasix with some improvement in symptoms, though continued desaturation with ambulation. She declined admission.   Patient comes in accompanied by her husband today.  They both indicate following initiation of Entresto from losartan the patient did remarkably well and noted an improvement in her functional status/energy level.  She has been able to participate in low impact aerobics for the full hour duration.  More recently, approximately 2 to 3 weeks prior she began to notice a dry cough with associated increased shortness of breath.  She noted her oxygen saturations during the day would be in the low 90s at rest and dip into the mid 80s with ambulation on room air.  In this setting, she started wearing her nocturnal oxygen during the day at 2 L with improvement in saturations at rest and with exertion.  Following her recent ED evaluation on 01/25/2019 she has continued to note some shortness of breath though this is improved.  She continues to wear her oxygen 24/7 at 2 L with oxygen saturations now in the mid 90s at rest and in the upper 80s with ambulation on room air.  Patient and husband do indicate leading up to her ED visit on 5/18 they had been adding salt to food though have subsequently discontinued this.  She indicates her lower extremity swelling is improved when compared to mid May.  Her weight has been stable at home ranging between 121 and 123 pounds.  She denies any orthopnea, PND, or early satiety.  No chest pain, palpitations, dizziness, presyncope, or syncope.  She denies any fevers,  chills, or known COVID exposures.   Past Medical History:  Diagnosis Date   Anxiety    Arthritis    "hands" (11/10/2015)   CAD s/p CABG x 1    a. 04/2015 LIMA to LAD   Chronic combined systolic (congestive) and diastolic (congestive) heart failure ([place])    a. 01/2015 Echo: EF 50-55%, Gr2 DD (preop valve surgery); b. 08/2015 Echo: EF 30-35%, diff HK. Gr2 DD; c. 09/2016 Echo: EF 50-55%. Nl fxning TV ring. Mildly dil LA.   COPD (chronic obstructive pulmonary disease) (HCC)    Coronary artery disease    GERD (gastroesophageal reflux disease)    History of colon polyps    History of mitral valve replacement    a. 04/2015 s/p 27 mm [facility] Mitral bovine bioprosthetic tissue valve   Hyperlipidemia    Hypertension    Hypothyroidism    Maze operation for AF w/ LAA clipping    a. 04/2015 Complete bilateral atrial lesion set using cryothermy and bipolar radiofrequency ablation with clipping of LA appendage   Meniere disease    Meniere's disease    On home oxygen therapy    "2L at night" (11/10/2015)   PAF (paroxysmal atrial fibrillation) ([place])    Pneumonia ~ 2010   PONV (postoperative nausea and vomiting)  Pt felt like eardrums were gonna pop   Post-surgical complete heart block, symptomatic    a. 04/2015 s/p MDT [id number] [employer]vis[employer] DR MRI DC PPM.   Presence of permanent cardiac pacemaker    Pulmonary hypertension ([place])    Surgical wound, non healing - chest wall 11/10/2015   Tricuspid Regurgitation s/p Repair    a. 04/2015 s/p 26 mm Edwards mc3 ring annuloplasty   Wound infection 10/13/2015   Superficial sternal wound infection    Past Surgical History:  Procedure Laterality Date   APPENDECTOMY     APPLICATION OF A-CELL OF CHEST/ABDOMEN N/A 11/21/2015   Procedure: APPLICATION OF A-CELL OF CHEST/ABDOMEN;  Surgeon: Loel Lofty Dillingham, DO;  Location: [place];  Service: Plastics;  Laterality: N/A;   APPLICATION OF A-CELL OF CHEST/ABDOMEN Right 12/21/2015   Procedure:  APPLICATION OF A-CELL OF RIGHT CHEST;  Surgeon: Loel Lofty Dillingham, DO;  Location: [place];  Service: Plastics;  Laterality: Right;   APPLICATION OF A-CELL OF CHEST/ABDOMEN Right 01/11/2016   Procedure: APPLICATION OF A-CELL OF RIGHT CHEST;  Surgeon: Loel Lofty Dillingham, DO;  Location: [place];  Service: Plastics;  Laterality: Right;   APPLICATION OF A-CELL OF CHEST/ABDOMEN Right 02/12/2016   Procedure: APPLICATION OF A-CELL TO RIGHT CHEST WOUND;  Surgeon: Loel Lofty Dillingham, DO;  Location: [place];  Service: Plastics;  Laterality: Right;   APPLICATION OF WOUND VAC N/A 05/09/2015   Procedure: APPLICATION OF WOUND VAC;  Surgeon: Rexene Alberts, MD;  Location: [place];  Service: Thoracic;  Laterality: N/A;   APPLICATION OF WOUND VAC N/A 11/13/2015   Procedure: APPLICATION OF WOUND VAC;  Surgeon: Rexene Alberts, MD;  Location: [place];  Service: Thoracic;  Laterality: N/A;   APPLICATION OF WOUND VAC N/A 11/21/2015   Procedure: APPLICATION OF WOUND VAC;  Surgeon: Loel Lofty Dillingham, DO;  Location: [place];  Service: Plastics;  Laterality: N/A;   APPLICATION OF WOUND VAC Right 12/21/2015   Procedure: APPLICATION OF WOUND VAC to right chest wall ;  Surgeon: Loel Lofty Dillingham, DO;  Location: [place];  Service: Plastics;  Laterality: Right;   APPLICATION OF WOUND VAC Right 01/11/2016   Procedure: APPLICATION OF WOUND VAC RIGHT CHEST ;  Surgeon: Loel Lofty Dillingham, DO;  Location: [place];  Service: Plastics;  Laterality: Right;   APPLICATION OF WOUND VAC Right 01/24/2016   Procedure: APPLICATION OF WOUND VAC RIGHT CHEST WALL;  Surgeon: Loel Lofty Dillingham, DO;  Location: [place];  Service: Plastics;  Laterality: Right;   APPLICATION OF WOUND VAC Right 02/12/2016   Procedure: RE-APPLICATION OF WOUND VAC TO RIGHT CHEST WOUND;  Surgeon: Loel Lofty Dillingham, DO;  Location: [place];  Service: Plastics;  Laterality: Right;   BREAST BIOPSY Left 11/25/06   neg   BREAST BIOPSY Left 01/20/12   /clip-neg   CARDIAC  CATHETERIZATION  11/2013   [facility]   CARDIAC CATHETERIZATION  10/2014   [facility]   CARDIAC VALVE REPLACEMENT     CARDIOVERSION N/A 07/20/2018   Procedure: CARDIOVERSION;  Surgeon: Wellington Hampshire, MD;  Location: [facility];  Service: Cardiovascular;  Laterality: N/A;   CATARACT EXTRACTION W/ INTRAOCULAR LENS  IMPLANT, BILATERAL Bilateral 2014   CLIPPING OF ATRIAL APPENDAGE N/A 04/25/2015   Procedure: CLIPPING OF ATRIAL APPENDAGE;  Surgeon: Rexene Alberts, MD;  Location: [place];  Service: Open Heart Surgery;  Laterality: N/A;   COCHLEAR IMPLANT Left 2005?   COLONOSCOPY WITH PROPOFOL N/A 01/20/2018   Procedure: COLONOSCOPY WITH PROPOFOL;  Surgeon: [place],  Benay Pike, MD;  Location: [facility];  Service: Gastroenterology;  Laterality: N/A;   CORONARY ANGIOPLASTY     CORONARY ARTERY BYPASS GRAFT N/A 04/25/2015   Procedure: CORONARY ARTERY BYPASS GRAFTING (CABG) x ONE, using left internal mammary artery;  Surgeon: Rexene Alberts, MD;  Location: [place];  Service: Open Heart Surgery;  Laterality: N/A;   DILATION AND CURETTAGE OF UTERUS  "several before hysterectomy"   EP IMPLANTABLE DEVICE N/A 05/02/2015   Procedure: Pacemaker Implant;  Surgeon: Thompson Grayer, MD;  Location: [place] [facility];  Service: Cardiovascular;  Laterality: N/A;   ESOPHAGOGASTRODUODENOSCOPY (EGD) WITH PROPOFOL N/A 01/20/2018   Procedure: ESOPHAGOGASTRODUODENOSCOPY (EGD) WITH PROPOFOL;  Surgeon: [place], Benay Pike, MD;  Location: [facility];  Service: Gastroenterology;  Laterality: N/A;   EYE SURGERY     I&D EXTREMITY Right 12/06/2015   Procedure: IRRIGATION AND DEBRIDEMENT RIGHT CHEST WALL WITH ACELL PLACEMENT AND VAC;  Surgeon: Loel Lofty Dillingham, DO;  Location: [place];  Service: Plastics;  Laterality: Right;   INCISION AND DRAINAGE OF WOUND N/A 11/21/2015   Procedure: IRRIGATION AND DEBRIDEMENT WOUND;  Surgeon: Loel Lofty Dillingham, DO;  Location: [place];  Service: Plastics;  Laterality: N/A;   INCISION AND DRAINAGE  OF WOUND Right 12/21/2015   Procedure: IRRIGATION AND DEBRIDEMENT right chest wall WOUND;  Surgeon: Loel Lofty Dillingham, DO;  Location: [place];  Service: Plastics;  Laterality: Right;  right chest wall    INCISION AND DRAINAGE OF WOUND Right 01/24/2016   Procedure: IRRIGATION AND DEBRIDEMENT RIGHT CHEST WALL WOUND;  Surgeon: Loel Lofty Dillingham, DO;  Location: [place];  Service: Plastics;  Laterality: Right;   INCISION AND DRAINAGE OF WOUND Right 03/28/2016   Procedure: IRRIGATION AND DEBRIDEMENT RIGHT CHEST WALL WOUND WITH A Cell Placement;  Surgeon: Loel Lofty Dillingham, DO;  Location: [place];  Service: Plastics;  Laterality: Right;   INCISION AND DRAINAGE OF WOUND Right 05/17/2016   Procedure: IRRIGATION AND DEBRIDEMENT OF RIGHT CHEST WOUND WITH A CELL PLACEMENT;  Surgeon: Loel Lofty Dillingham, DO;  Location: [place];  Service: Plastics;  Laterality: Right;   INSERT / REPLACE / REMOVE PACEMAKER     IRRIGATION AND DEBRIDEMENT OF WOUND WITH SPLIT THICKNESS SKIN GRAFT Right 01/11/2016   Procedure: IRRIGATION AND DEBRIDEMENT OF RIGHT CHEST WOUND ;  Surgeon: Loel Lofty Dillingham, DO;  Location: [place];  Service: Plastics;  Laterality: Right;   MASTECTOMY, PARTIAL Right 2002   positive   MAZE N/A 04/25/2015   Procedure: MAZE;  Surgeon: Rexene Alberts, MD;  Location: [place];  Service: Open Heart Surgery;  Laterality: N/A;   MITRAL VALVE REPAIR N/A 04/25/2015   Procedure: MITRAL VALVE  REPLACEMENT using a 27 mm Edwards Perimount Magna Mitral Ease Valve;  Surgeon: Rexene Alberts, MD;  Location: [place];  Service: Open Heart Surgery;  Laterality: N/A;   SKIN SPLIT GRAFT Right 02/12/2016   Procedure: IRRIGATION AND DEBRIDEMENT RIGHT CHEST WOUND;  Surgeon: Loel Lofty Dillingham, DO;  Location: [place];  Service: Plastics;  Laterality: Right;   STERNAL WIRES REMOVAL N/A 06/05/2015   Procedure: STERNAL WIRES REMOVAL;  Surgeon: Rexene Alberts, MD;  Location: [place];  Service: Thoracic;  Laterality: N/A;   STERNAL WOUND  DEBRIDEMENT N/A 05/09/2015   Procedure: STERNAL WOUND DEBRIDEMENT;  Surgeon: Rexene Alberts, MD;  Location: [place];  Service: Thoracic;  Laterality: N/A;   STERNAL WOUND DEBRIDEMENT N/A 11/13/2015   Procedure: Excisional drainage of RIGHT Chest wall mass and breast mass ;  Surgeon: Rexene Alberts, MD;  Location: [place];  Service: Thoracic;  Laterality: N/A;   TEE WITHOUT CARDIOVERSION N/A 04/25/2015   Procedure: TRANSESOPHAGEAL ECHOCARDIOGRAM (TEE);  Surgeon: Rexene Alberts, MD;  Location: [place];  Service: Open Heart Surgery;  Laterality: N/A;   TONSILLECTOMY     TRAM Right 11/18/2015   Procedure: VRAM Vertical Rectus Abdominus Muscle Flap;  Surgeon: Loel Lofty Dillingham, DO;  Location: [place];  Service: Plastics;  Laterality: Right;  RIght Back   TRICUSPID VALVE REPLACEMENT N/A 04/25/2015   Procedure: TRICUSPID VALVE REPAIR;  Surgeon: Rexene Alberts, MD;  Location: [place];  Service: Open Heart Surgery;  Laterality: N/A;   VAGINAL HYSTERECTOMY      Current Meds  Medication Sig   acetaminophen (TYLENOL) 325 MG tablet Take 2 tablets (650 mg total) by mouth every 4 (four) hours as needed for mild pain (or Fever >/= 101).   AMBULATORY NON FORMULARY MEDICATION Inogen at Home Oxygen machine with tubing. DX: COPD DX Code: J44.9   amiodarone (PACERONE) 200 MG tablet TAKE 1 TABLET BY MOUTH EVERY DAY   aspirin EC 81 MG EC tablet Take 1 tablet (81 mg total) by mouth daily.   azelastine (ASTELIN) 0.1 % nasal spray Place 1 spray into both nostrils 2 (two) times daily as needed for rhinitis or allergies.    bisoprolol (ZEBETA) 5 MG tablet Take 1 tablet (5 mg total) by mouth daily.   ferrous sulfate 325 (65 FE) MG tablet Take 325 mg by mouth daily with breakfast.   fluticasone (FLONASE) 50 MCG/ACT nasal spray Place 1 spray into both nostrils daily as needed for allergies.    furosemide (LASIX) 40 MG tablet Take 0.5 tablets (20 mg total) by mouth 2 (two) times daily.   gabapentin (NEURONTIN) 100 MG  capsule Take 200 mg by mouth at bedtime.    ipratropium-albuterol (DUONEB) 0.5-2.5 (3) MG/3ML SOLN Take 3 mLs by nebulization every 6 (six) hours as needed. DX:J44.9 (Patient taking differently: Take 3 mLs by nebulization every 6 (six) hours as needed (for wheezing/shortness of breath). )   levothyroxine (SYNTHROID, LEVOTHROID) 88 MCG tablet Take 88 mcg by mouth daily before breakfast.   Multiple Vitamins-Minerals (PRESERVISION AREDS 2 PO) Take 2 tablets by mouth daily.   pantoprazole (PROTONIX) 40 MG tablet Take 40 mg by mouth 2 (two) times daily.    sacubitril-valsartan (ENTRESTO) 24-26 MG Take 1 tablet by mouth 2 (two) times daily.   simvastatin (ZOCOR) 10 MG tablet Take 1 tablet (10 mg total) by mouth at bedtime.   spironolactone (ALDACTONE) 25 MG tablet Take 0.5 tablets (12.5 mg total) by mouth daily.   warfarin (COUMADIN) 2.5 MG tablet TAKE AS DIRECTED BY COUMADIN CLINIC   warfarin (COUMADIN) 3 MG tablet Take 1 tablet (3 mg total) by mouth one time only at 6 PM.    Allergies:   Ace inhibitors; Amoxicillin-pot clavulanate; Penicillins; Nifedipine; Propranolol; Diazepam; Meperidine; and Propoxyphene   Social History:  The patient  reports that she quit smoking about 20 years ago. Her smoking use included cigarettes. She has a 60.00 pack-year smoking history. She has never used smokeless tobacco. She reports current alcohol use. She reports that she does not use drugs.   Family History:  The patient's family history includes Breast cancer in her maternal aunt and paternal aunt; CVA in her mother; Heart attack in her paternal uncle; Heart disease in her brother and father.  ROS:   Review of Systems  Constitutional: Positive for malaise/fatigue.  Negative for chills, diaphoresis, fever and weight loss.  HENT: Negative for congestion.   Eyes: Negative for discharge and redness.  Respiratory: Positive for cough and shortness of breath. Negative for hemoptysis, sputum production and  wheezing.        Improving shortness of breath and cough  Cardiovascular: Positive for leg swelling. Negative for chest pain, palpitations, orthopnea, claudication and PND.  Gastrointestinal: Negative for abdominal pain, blood in stool, heartburn, melena, nausea and vomiting.  Genitourinary: Negative for hematuria.  Musculoskeletal: Negative for falls and myalgias.  Skin: Negative for rash.  Neurological: Positive for weakness. Negative for dizziness, tingling, tremors, sensory change, speech change, focal weakness and loss of consciousness.  Endo/Heme/Allergies: Does not bruise/bleed easily.  Psychiatric/Behavioral: Negative for substance abuse. The patient is not nervous/anxious.   All other systems reviewed and are negative.    PHYSICAL EXAM:  VS:  BP 124/64 (BP Location: Left Arm, Patient Position: Sitting, Cuff Size: Normal)    Pulse 74    Ht 5' 1.5" (1.562 m)    Wt 121 lb (54.9 kg)    BMI 22.49 kg/m  BMI: Body mass index is 22.49 kg/m.  Physical Exam  Constitutional: She is oriented to person, place, and time. She appears well-developed and well-nourished.  HENT:  Head: Normocephalic and atraumatic.  Eyes: Right eye exhibits no discharge. Left eye exhibits no discharge.  Neck: Normal range of motion. No JVD present.  Cardiovascular: Normal rate, regular rhythm, S1 normal, S2 normal and normal heart sounds. Exam reveals no distant heart sounds, no friction rub, no midsystolic click and no opening snap.  No murmur heard. Pulmonary/Chest: Effort normal. No respiratory distress. She has decreased breath sounds in the left lower field. She has no wheezes. She has no rhonchi. She has no rales. She exhibits no tenderness.  Supplemental oxygen noted via nasal cannula flowing at 2 L  Abdominal: Soft. She exhibits no distension. There is no abdominal tenderness.  Musculoskeletal:        General: Edema present.     Comments: Trace bilateral ankle edema with the right ankle being worse than  the left  Neurological: She is alert and oriented to person, place, and time.  Skin: Skin is warm and dry. No cyanosis. Nails show no clubbing.  Psychiatric: She has a normal mood and affect. Her speech is normal and behavior is normal. Judgment and thought content normal.     EKG:  Was ordered and interpreted by me today. Shows AV paced rhythm, 61 bpm  Recent Labs: 05/13/2018: Magnesium 2.2 07/17/2018: B Natriuretic Peptide 477.0 10/26/2018: ALT 20; TSH 2.860 01/25/2019: BUN 30; Creatinine, Ser 1.04; Hemoglobin 11.3; Platelets 247; Potassium 3.2; Sodium 142  No results found for requested labs within last 8760 hours.   Estimated Creatinine Clearance: 32.3 mL/min (A) (by C-G formula based on SCr of 1.04 mg/dL (H)).   Wt Readings from Last 3 Encounters:  02/04/19 121 lb (54.9 kg)  01/25/19 125 lb (56.7 kg)  10/27/18 128 lb (58.1 kg)     Other studies reviewed: Additional studies/records reviewed today include: summarized above  ASSESSMENT AND PLAN:  1. Acute on chronic hypoxic respiratory failure: Improving and likely multifactorial including combined CHF, COPD, and pulmonary hypertension.  Oxygen saturations are improved today when compared to recent readings with an ambulatory oxygen saturation of 91% on 2 L oxygen via nasal cannula and 88% ambulatory oxygen saturation on room air.  Resting oxygen saturation in the mid 90s.  Discussed potential ED evaluation with the  patient and husband and given the improvement noted with the patient we have agreed to defer this at this time and assess her labs through the [place] with follow-up recommendations pending these values.  She has been advised to contact her pulmonologist for further recommendations as well as I cannot exclude an underlying pulmonary component.  2. Chronic combined CHF secondary to mixed ICM and NICM: She does not appear grossly volume overloaded on exam today.  There are no crackles on her exam, JVD is not elevated, there  is a stable trace bilateral ankle edema, and her weight is down approximately 6 to 8 pounds from her most recent office visits.  Most recent labs indicate a prerenal state.  In the above setting, I am hesitant to escalate her diuretic therapy any further at this time.  We will check a BMP with further recommendations pending.  For now, she will continue Entresto, bisoprolol, and spironolactone.  Await input from EP regarding potential CRT upgrade.  3. CAD involving the native coronary arteries status post CABG without angina: No symptoms of angina at this time.  Continue aspirin, bisoprolol, and simvastatin.  If her dyspnea persists we may need to repeat ischemic evaluation.  4. Pulmonary hypertension: Likely in the setting of underlying COPD.  This too is likely playing a role in her dyspnea.  Following trending of her renal function and appropriate adjustment and diuretic therapy, should symptoms persist she may benefit from right heart catheterization to further estimate her volume status.  5. PAF: Currently in an AV paced rhythm.  Continue bisoprolol and amiodarone per EP.  Recent TSH and liver function normal.  Coumadin managed by Coumadin clinic and therapeutic.  6. AKI: Labs from the ED visit on 01/25/2019 appear to show a prerenal state with elevated BUN and serum creatinine.  Check BMP today to trend.  7. Valvular heart disease: Stable on most recent echo.  Continue to monitor.  Remains on Coumadin and aspirin as outlined above.  8. Complete heart block: Status post MDT PPM.  EP is evaluating for potential CRT upgrade as outlined above.  9. COPD: She does not appear to be an acute exacerbation though I do suspect this is playing a role in some of her dyspnea as outlined above.  I have advised her to contact her pulmonologist for follow-up.  10. Hypertension: Blood pressure is well controlled.  Continue current medications including bisoprolol, Lasix, Entresto, and  spironolactone.  11. Hyperlipidemia: LDL of 56 from 07/2018.  Tolerating low-dose simvastatin.  Disposition: F/u with Dr. Fletcher Anon or an APP in 1 month and EP as directed next month.  Current medicines are reviewed at length with the patient today.  The patient did not have any concerns regarding medicines.  Signed, Christell Faith, PA-C 02/04/2019 4:18 PM     [place], [place] [address] [phone]

## 2019-02-04 ENCOUNTER — Other Ambulatory Visit: Payer: Self-pay

## 2019-02-04 ENCOUNTER — Ambulatory Visit (INDEPENDENT_AMBULATORY_CARE_PROVIDER_SITE_OTHER): Payer: Medicare Other | Admitting: Physician Assistant

## 2019-02-04 ENCOUNTER — Encounter: Payer: Self-pay | Admitting: Physician Assistant

## 2019-02-04 ENCOUNTER — Other Ambulatory Visit
Admission: RE | Admit: 2019-02-04 | Discharge: 2019-02-04 | Disposition: A | Discharge status: Home / Self Care | Payer: Medicare Other | Source: Ambulatory Visit | Attending: Physician Assistant | Admitting: Physician Assistant

## 2019-02-04 ENCOUNTER — Other Ambulatory Visit: Payer: Self-pay | Admitting: Physician Assistant

## 2019-02-04 VITALS — BP 124/64 | HR 74 | Ht 61.5 in | Wt 121.0 lb

## 2019-02-04 DIAGNOSIS — I2510 Atherosclerotic heart disease of native coronary artery without angina pectoris: Secondary | ICD-10-CM

## 2019-02-04 DIAGNOSIS — J9621 Acute and chronic respiratory failure with hypoxia: Secondary | ICD-10-CM | POA: Insufficient documentation

## 2019-02-04 DIAGNOSIS — I442 Atrioventricular block, complete: Secondary | ICD-10-CM

## 2019-02-04 DIAGNOSIS — Z950 Presence of cardiac pacemaker: Secondary | ICD-10-CM

## 2019-02-04 DIAGNOSIS — I272 Pulmonary hypertension, unspecified: Secondary | ICD-10-CM | POA: Diagnosis not present

## 2019-02-04 DIAGNOSIS — J449 Chronic obstructive pulmonary disease, unspecified: Secondary | ICD-10-CM

## 2019-02-04 DIAGNOSIS — I10 Essential (primary) hypertension: Secondary | ICD-10-CM

## 2019-02-04 DIAGNOSIS — I340 Nonrheumatic mitral (valve) insufficiency: Secondary | ICD-10-CM

## 2019-02-04 DIAGNOSIS — N179 Acute kidney failure, unspecified: Secondary | ICD-10-CM

## 2019-02-04 DIAGNOSIS — E785 Hyperlipidemia, unspecified: Secondary | ICD-10-CM

## 2019-02-04 DIAGNOSIS — I480 Paroxysmal atrial fibrillation: Secondary | ICD-10-CM

## 2019-02-04 HISTORY — DX: Acute and chronic respiratory failure with hypoxia: J96.21

## 2019-02-04 LAB — BASIC METABOLIC PANEL
Anion gap: 10 (ref 5–15)
BUN: 31 mg/dL — ABNORMAL HIGH (ref 8–23)
CO2: 32 mmol/L (ref 22–32)
Calcium: 9.1 mg/dL (ref 8.9–10.3)
Chloride: 97 mmol/L — ABNORMAL LOW (ref 98–111)
Creatinine, Ser: 0.93 mg/dL (ref 0.44–1.00)
GFR calc Af Amer: >60 mL/min (ref >60)
GFR calc non Af Amer: 57 mL/min — ABNORMAL LOW (ref >60)
Glucose, Bld: 130 mg/dL — ABNORMAL HIGH (ref 70–99)
Potassium: 4.6 mmol/L (ref 3.5–5.1)
Sodium: 139 mmol/L (ref 135–145)

## 2019-02-04 NOTE — Patient Instructions (Signed)
Medication Instructions:  Your physician recommends that you continue on your current medications as directed. Please refer to the Current Medication list given to you today.  If you need a refill on your cardiac medications before your next appointment, please call your pharmacy.   Lab work: Your physician recommends that you return for lab work today [facility]) at [employer].No appt is needed. Hours are M-F 7AM- 6 PM.  If you have labs (blood work) drawn today and your tests are completely normal, you will receive your results only by: Marland Kitchen MyChart Message (if you have MyChart) OR . A paper copy in the mail If you have any lab test that is abnormal or we need to change your treatment, we will call you to review the results.  Testing/Procedures: None ordered  Follow-Up: At [facility], you and your health needs are our priority.  As part of our continuing mission to provide you with exceptional heart care, we have created designated Provider Care Teams.  These Care Teams include your primary Cardiologist (physician) and Advanced Practice Providers (APPs -  Physician Assistants and Nurse Practitioners) who all work together to provide you with the care you need, when you need it. You will need a follow up appointment in 1-2 months.  You may see Kathlyn Sacramento, MD or  Christell Faith, PA-C.

## 2019-02-05 ENCOUNTER — Telehealth: Payer: Self-pay

## 2019-02-05 ENCOUNTER — Other Ambulatory Visit: Payer: Self-pay

## 2019-02-05 ENCOUNTER — Emergency Department
Admission: EM | Admit: 2019-02-05 | Discharge: 2019-02-05 | Disposition: A | Discharge status: Home / Self Care | Payer: Medicare Other | Attending: Emergency Medicine | Admitting: Emergency Medicine

## 2019-02-05 ENCOUNTER — Other Ambulatory Visit
Admission: RE | Admit: 2019-02-05 | Discharge: 2019-02-05 | Disposition: A | Discharge status: Home / Self Care | Payer: Medicare Other | Source: Ambulatory Visit | Attending: Physician Assistant | Admitting: Physician Assistant

## 2019-02-05 ENCOUNTER — Telehealth: Payer: Self-pay | Admitting: Cardiovascular Disease

## 2019-02-05 DIAGNOSIS — I2510 Atherosclerotic heart disease of native coronary artery without angina pectoris: Secondary | ICD-10-CM | POA: Insufficient documentation

## 2019-02-05 DIAGNOSIS — I5042 Chronic combined systolic (congestive) and diastolic (congestive) heart failure: Secondary | ICD-10-CM | POA: Diagnosis not present

## 2019-02-05 DIAGNOSIS — Z952 Presence of prosthetic heart valve: Secondary | ICD-10-CM | POA: Diagnosis not present

## 2019-02-05 DIAGNOSIS — Z1159 Encounter for screening for other viral diseases: Secondary | ICD-10-CM | POA: Diagnosis not present

## 2019-02-05 DIAGNOSIS — E039 Hypothyroidism, unspecified: Secondary | ICD-10-CM | POA: Insufficient documentation

## 2019-02-05 DIAGNOSIS — Z79899 Other long term (current) drug therapy: Secondary | ICD-10-CM | POA: Diagnosis not present

## 2019-02-05 DIAGNOSIS — Z87891 Personal history of nicotine dependence: Secondary | ICD-10-CM | POA: Diagnosis not present

## 2019-02-05 DIAGNOSIS — Z7982 Long term (current) use of aspirin: Secondary | ICD-10-CM | POA: Insufficient documentation

## 2019-02-05 DIAGNOSIS — J449 Chronic obstructive pulmonary disease, unspecified: Secondary | ICD-10-CM | POA: Diagnosis not present

## 2019-02-05 DIAGNOSIS — R040 Epistaxis: Secondary | ICD-10-CM

## 2019-02-05 DIAGNOSIS — Z951 Presence of aortocoronary bypass graft: Secondary | ICD-10-CM | POA: Diagnosis not present

## 2019-02-05 DIAGNOSIS — Z7901 Long term (current) use of anticoagulants: Secondary | ICD-10-CM | POA: Diagnosis not present

## 2019-02-05 DIAGNOSIS — I110 Hypertensive heart disease with heart failure: Secondary | ICD-10-CM | POA: Diagnosis not present

## 2019-02-05 DIAGNOSIS — E119 Type 2 diabetes mellitus without complications: Secondary | ICD-10-CM | POA: Insufficient documentation

## 2019-02-05 LAB — CBC
HCT: 37.3 % (ref 36.0–46.0)
Hemoglobin: 11.5 g/dL — ABNORMAL LOW (ref 12.0–15.0)
MCH: 31.8 pg (ref 26.0–34.0)
MCHC: 30.8 g/dL (ref 30.0–36.0)
MCV: 103 fL — ABNORMAL HIGH (ref 80.0–100.0)
Platelets: 299 10*3/uL (ref 150–400)
RBC: 3.62 MIL/uL — ABNORMAL LOW (ref 3.87–5.11)
RDW: 14.6 % (ref 11.5–15.5)
WBC: 6.8 10*3/uL (ref 4.0–10.5)
nRBC: 0 % (ref 0.0–0.2)

## 2019-02-05 LAB — PROTIME-INR
INR: 2.8 — ABNORMAL HIGH (ref 0.8–1.2)
Prothrombin Time: 28.9 seconds — ABNORMAL HIGH (ref 11.4–15.2)

## 2019-02-05 LAB — SARS CORONAVIRUS 2 BY RT PCR (HOSPITAL ORDER, PERFORMED IN ~~LOC~~ HOSPITAL LAB): SARS Coronavirus 2: NEGATIVE

## 2019-02-05 MED ORDER — OXYMETAZOLINE HCL 0.05 % NA SOLN
1.0000 | Freq: Once | NASAL | Status: AC
  Administered 2019-02-05: 16:00:00 1 via NASAL
  Filled 2019-02-05: qty 30

## 2019-02-05 NOTE — Telephone Encounter (Signed)
-----   Message from Rise Mu, PA-C sent at 02/04/2019  6:11 PM EDT ----- Renal function is stable to improved.  Potassium is improved and at goal.  Discussed patient with Dr. Fletcher Anon.  - Plan is to schedule patient for a right heart cath with him on Monday, 02/08/2019. - She does not need to hold Coumadin for this procedure (discussed with Dr. Fletcher Anon).  - Please call the cath lab and get her scheduled for RHC as above. I have placed orders for the case request and cath.  - She will contact her pulmonologist once the [place] is completed for follow up.  - I have discussed all of the above with the patient's husband, he is in agreement.  - Patient has had COVID-19 testing as of 01/25/2019, which was negative.

## 2019-02-05 NOTE — ED Notes (Signed)
Placed on adult simple face mask at 6L, instructed patient to breathe through mouth, 91%

## 2019-02-05 NOTE — ED Triage Notes (Signed)
Pt to ER via POV c/o nose bleed X 3 hours. Pt had preprocedure COVID swab today and has nose bleed since. Pt takes coumadin and wears 2L [place] at home but has been unable to since the nose has been bleeding. Pt alert and oriented X4, active, cooperative, pt in NAD. RR even and unlabored, color WNL.  Bleeding stopped at this current time.

## 2019-02-05 NOTE — Telephone Encounter (Signed)
Spoke with the husband. He stated that the patient had her coronavirus test this afternoon and now has a nose bleed. She is on coumadin. They have been applying a cold pack and holding pressure. He stated that it has been bleeding for an hour but feels like it may be easing up now.   He has been advised to keep pressure on it and try not to irritate it. He stated that if it gets worse they will go to an urgent care or the ED.

## 2019-02-05 NOTE — ED Provider Notes (Signed)
[facility] Emergency Department Provider Note   ____________________________________________    I have reviewed the triage vital signs and the nursing notes.   HISTORY  Chief Complaint Epistaxis     HPI [name] is a 84 y.o. female on Coumadin who presents with complaints of nosebleed after having preoperative COVID nasal swab.  She reports it is been bleeding for about 3 hours, left nare.  She is tried holding pressure unsuccessfully.  Clamp applied by triage nurse and this seems to have helped bleeding significantly  Past Medical History:  Diagnosis Date  . Anxiety   . Arthritis    "hands" (11/10/2015)  . CAD s/p CABG x 1    a. 04/2015 LIMA to LAD  . Chronic combined systolic (congestive) and diastolic (congestive) heart failure ([place])    a. 01/2015 Echo: EF 50-55%, Gr2 DD (preop valve surgery); b. 08/2015 Echo: EF 30-35%, diff HK. Gr2 DD; c. 09/2016 Echo: EF 50-55%. Nl fxning TV ring. Mildly dil LA.  Marland Kitchen COPD (chronic obstructive pulmonary disease) ([place])   . Coronary artery disease   . GERD (gastroesophageal reflux disease)   . History of colon polyps   . History of mitral valve replacement    a. 04/2015 s/p 27 mm [facility] Mitral bovine bioprosthetic tissue valve  . Hyperlipidemia   . Hypertension   . Hypothyroidism   . 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 04/2015 Complete bilateral atrial lesion set using cryothermy and bipolar radiofrequency ablation with clipping of LA appendage  . Meniere disease   . Meniere's disease   . On home oxygen therapy    "2L at night" (11/10/2015)  . PAF (paroxysmal atrial fibrillation) ([place])   . Pneumonia ~ 2010  . PONV (postoperative nausea and vomiting)    Pt felt like eardrums were gonna pop  . Post-surgical complete heart block, symptomatic    a. 04/2015 s/p MDT [id number] Advisa DR MRI DC PPM.  . Presence of permanent cardiac pacemaker   . Pulmonary hypertension ([place])   . Surgical wound, non  healing - chest wall 11/10/2015  . Tricuspid Regurgitation s/p Repair    a. 04/2015 s/p 26 mm Edwards mc3 ring annuloplasty  . Wound infection 10/13/2015   Superficial sternal wound infection    Patient Active Problem List   Diagnosis Date Noted  . Acute on chronic respiratory failure with hypoxia ([place]) 02/04/2019  . Chronic diastolic CHF (congestive heart failure) ([place])   . S/P placement of cardiac pacemaker 05/02/15, medtronic 05/03/2015  . Bradycardia 05/03/2015  . S/P mitral valve replacement with bioprosthetic valve, tricuspid valve repair, maze procedure and CABG x1 04/25/2015  . S/P tricuspid valve repair 04/25/2015  . S/P Maze operation for atrial fibrillation 04/25/2015  . S/P CABG x 1 04/25/2015  . Coronary artery disease   . Pulmonary hypertension ([place])   . Tricuspid regurgitation   . Chronic combined systolic and diastolic CHF (congestive heart failure) ([place])   . Chronic systolic heart failure ([place]) 11/10/2014  . Essential hypertension 11/10/2014  . Mitral regurgitation 11/10/2014  . Hyperlipidemia 11/10/2014  . Disorder of mitral valve 11/10/2014  . Atrial fibrillation ([place]) 11/10/2014  . Acid reflux 05/10/2014  . Type 2 diabetes mellitus ([place]) 05/08/2014  . Auditory vertigo 05/08/2014  . Adult hypothyroidism 05/08/2014  . Hypercholesterolemia 05/08/2014  . H/O adenomatous polyp of colon 05/08/2014  . Benign essential HTN 05/08/2014  . Moderate COPD (chronic obstructive pulmonary disease) ([place]) 03/28/2014  Past Surgical History:  Procedure Laterality Date  . APPENDECTOMY    . APPLICATION OF A-CELL OF CHEST/ABDOMEN N/A 11/21/2015   Procedure: APPLICATION OF A-CELL OF CHEST/ABDOMEN;  Surgeon: Loel Lofty Dillingham, DO;  Location: [place];  Service: Plastics;  Laterality: N/A;  . APPLICATION OF A-CELL OF CHEST/ABDOMEN Right 12/21/2015   Procedure: APPLICATION OF A-CELL OF RIGHT CHEST;  Surgeon: Loel Lofty Dillingham, DO;  Location: [place];  Service: Plastics;  Laterality:  Right;  . APPLICATION OF A-CELL OF CHEST/ABDOMEN Right 01/11/2016   Procedure: APPLICATION OF A-CELL OF RIGHT CHEST;  Surgeon: Loel Lofty Dillingham, DO;  Location: [place];  Service: Plastics;  Laterality: Right;  . APPLICATION OF A-CELL OF CHEST/ABDOMEN Right 02/12/2016   Procedure: APPLICATION OF A-CELL TO RIGHT CHEST WOUND;  Surgeon: Loel Lofty Dillingham, DO;  Location: [place];  Service: Plastics;  Laterality: Right;  . APPLICATION OF WOUND VAC N/A 05/09/2015   Procedure: APPLICATION OF WOUND VAC;  Surgeon: Rexene Alberts, MD;  Location: [place];  Service: Thoracic;  Laterality: N/A;  . APPLICATION OF WOUND VAC N/A 11/13/2015   Procedure: APPLICATION OF WOUND VAC;  Surgeon: Rexene Alberts, MD;  Location: [place];  Service: Thoracic;  Laterality: N/A;  . APPLICATION OF WOUND VAC N/A 11/21/2015   Procedure: APPLICATION OF WOUND VAC;  Surgeon: Loel Lofty Dillingham, DO;  Location: [place];  Service: Plastics;  Laterality: N/A;  . APPLICATION OF WOUND VAC Right 12/21/2015   Procedure: APPLICATION OF WOUND VAC to right chest wall ;  Surgeon: Loel Lofty Dillingham, DO;  Location: [place];  Service: Plastics;  Laterality: Right;  . APPLICATION OF WOUND VAC Right 01/11/2016   Procedure: APPLICATION OF WOUND VAC RIGHT CHEST ;  Surgeon: Loel Lofty Dillingham, DO;  Location: [place];  Service: Plastics;  Laterality: Right;  . APPLICATION OF WOUND VAC Right 01/24/2016   Procedure: APPLICATION OF WOUND VAC RIGHT CHEST WALL;  Surgeon: Loel Lofty Dillingham, DO;  Location: [place];  Service: Plastics;  Laterality: Right;  . APPLICATION OF WOUND VAC Right 02/12/2016   Procedure: RE-APPLICATION OF WOUND VAC TO RIGHT CHEST WOUND;  Surgeon: Loel Lofty Dillingham, DO;  Location: [place];  Service: Plastics;  Laterality: Right;  . BREAST BIOPSY Left 11/25/06   neg  . BREAST BIOPSY Left 01/20/12   /clip-neg  . CARDIAC CATHETERIZATION  11/2013   [facility]  . CARDIAC CATHETERIZATION  10/2014   [facility]  . CARDIAC VALVE REPLACEMENT    . CARDIOVERSION N/A  07/20/2018   Procedure: CARDIOVERSION;  Surgeon: Wellington Hampshire, MD;  Location: [facility];  Service: Cardiovascular;  Laterality: N/A;  . CATARACT EXTRACTION W/ INTRAOCULAR LENS  IMPLANT, BILATERAL Bilateral 2014  . CLIPPING OF ATRIAL APPENDAGE N/A 04/25/2015   Procedure: CLIPPING OF ATRIAL APPENDAGE;  Surgeon: Rexene Alberts, MD;  Location: [place];  Service: Open Heart Surgery;  Laterality: N/A;  . COCHLEAR IMPLANT Left 2005?  . COLONOSCOPY WITH PROPOFOL N/A 01/20/2018   Procedure: COLONOSCOPY WITH PROPOFOL;  Surgeon: Toledo, Benay Pike, MD;  Location: [facility];  Service: Gastroenterology;  Laterality: N/A;  . CORONARY ANGIOPLASTY    . CORONARY ARTERY BYPASS GRAFT N/A 04/25/2015   Procedure: CORONARY ARTERY BYPASS GRAFTING (CABG) x ONE, using left internal mammary artery;  Surgeon: Rexene Alberts, MD;  Location: [place];  Service: Open Heart Surgery;  Laterality: N/A;  . DILATION AND CURETTAGE OF UTERUS  "several before hysterectomy"  . EP IMPLANTABLE DEVICE N/A 05/02/2015   Procedure: Pacemaker Implant;  Surgeon: Thompson Grayer, MD;  Location: [place] [facility];  Service: Cardiovascular;  Laterality: N/A;  . ESOPHAGOGASTRODUODENOSCOPY (EGD) WITH PROPOFOL N/A 01/20/2018   Procedure: ESOPHAGOGASTRODUODENOSCOPY (EGD) WITH PROPOFOL;  Surgeon: Toledo, Benay Pike, MD;  Location: [facility];  Service: Gastroenterology;  Laterality: N/A;  . EYE SURGERY    . I&D EXTREMITY Right 12/06/2015   Procedure: IRRIGATION AND DEBRIDEMENT RIGHT CHEST WALL WITH ACELL PLACEMENT AND VAC;  Surgeon: Loel Lofty Dillingham, DO;  Location: [place];  Service: Plastics;  Laterality: Right;  . INCISION AND DRAINAGE OF WOUND N/A 11/21/2015   Procedure: IRRIGATION AND DEBRIDEMENT WOUND;  Surgeon: Loel Lofty Dillingham, DO;  Location: [place];  Service: Plastics;  Laterality: N/A;  . INCISION AND DRAINAGE OF WOUND Right 12/21/2015   Procedure: IRRIGATION AND DEBRIDEMENT right chest wall WOUND;  Surgeon: Loel Lofty Dillingham, DO;   Location: [place];  Service: Plastics;  Laterality: Right;  right chest wall   . INCISION AND DRAINAGE OF WOUND Right 01/24/2016   Procedure: IRRIGATION AND DEBRIDEMENT RIGHT CHEST WALL WOUND;  Surgeon: Loel Lofty Dillingham, DO;  Location: [place];  Service: Plastics;  Laterality: Right;  . INCISION AND DRAINAGE OF WOUND Right 03/28/2016   Procedure: IRRIGATION AND DEBRIDEMENT RIGHT CHEST WALL WOUND WITH A Cell Placement;  Surgeon: Loel Lofty Dillingham, DO;  Location: [place];  Service: Plastics;  Laterality: Right;  . INCISION AND DRAINAGE OF WOUND Right 05/17/2016   Procedure: IRRIGATION AND DEBRIDEMENT OF RIGHT CHEST WOUND WITH A CELL PLACEMENT;  Surgeon: Loel Lofty Dillingham, DO;  Location: [place];  Service: Plastics;  Laterality: Right;  . INSERT / REPLACE / REMOVE PACEMAKER    . IRRIGATION AND DEBRIDEMENT OF WOUND WITH SPLIT THICKNESS SKIN GRAFT Right 01/11/2016   Procedure: IRRIGATION AND DEBRIDEMENT OF RIGHT CHEST WOUND ;  Surgeon: Loel Lofty Dillingham, DO;  Location: [place];  Service: Plastics;  Laterality: Right;  . MASTECTOMY, PARTIAL Right 2002   positive  . MAZE N/A 04/25/2015   Procedure: MAZE;  Surgeon: Rexene Alberts, MD;  Location: [place];  Service: Open Heart Surgery;  Laterality: N/A;  . MITRAL VALVE REPAIR N/A 04/25/2015   Procedure: MITRAL VALVE  REPLACEMENT using a 27 mm Edwards Perimount Magna Mitral Ease Valve;  Surgeon: Rexene Alberts, MD;  Location: [place];  Service: Open Heart Surgery;  Laterality: N/A;  . SKIN SPLIT GRAFT Right 02/12/2016   Procedure: IRRIGATION AND DEBRIDEMENT RIGHT CHEST WOUND;  Surgeon: Loel Lofty Dillingham, DO;  Location: [place];  Service: Plastics;  Laterality: Right;  . STERNAL WIRES REMOVAL N/A 06/05/2015   Procedure: STERNAL WIRES REMOVAL;  Surgeon: Rexene Alberts, MD;  Location: [place];  Service: Thoracic;  Laterality: N/A;  . STERNAL WOUND DEBRIDEMENT N/A 05/09/2015   Procedure: STERNAL WOUND DEBRIDEMENT;  Surgeon: Rexene Alberts, MD;  Location: [place];  Service:  Thoracic;  Laterality: N/A;  . STERNAL WOUND DEBRIDEMENT N/A 11/13/2015   Procedure: Excisional drainage of RIGHT Chest wall mass and breast mass ;  Surgeon: Rexene Alberts, MD;  Location: [place];  Service: Thoracic;  Laterality: N/A;  . TEE WITHOUT CARDIOVERSION N/A 04/25/2015   Procedure: TRANSESOPHAGEAL ECHOCARDIOGRAM (TEE);  Surgeon: Rexene Alberts, MD;  Location: [place];  Service: Open Heart Surgery;  Laterality: N/A;  . TONSILLECTOMY    . TRAM Right 11/18/2015   Procedure: VRAM Vertical Rectus Abdominus Muscle Flap;  Surgeon: Loel Lofty Dillingham, DO;  Location: [place];  Service: Plastics;  Laterality: Right;  RIght  Back  . TRICUSPID VALVE REPLACEMENT N/A 04/25/2015   Procedure: TRICUSPID VALVE REPAIR;  Surgeon: Rexene Alberts, MD;  Location: [place];  Service: Open Heart Surgery;  Laterality: N/A;  . VAGINAL HYSTERECTOMY      Prior to Admission medications   Medication Sig Start Date End Date Taking? Authorizing Provider  acetaminophen (TYLENOL) 325 MG tablet Take 2 tablets (650 mg total) by mouth every 4 (four) hours as needed for mild pain (or Fever >/= 101). 05/11/15   Barrett, Erin R, PA-C  AMBULATORY NON FORMULARY MEDICATION [facility] at Home Oxygen machine with tubing. DX: COPD DX Code: J44.9 01/15/17   Wilhelmina Mcardle, MD  amiodarone (PACERONE) 200 MG tablet TAKE 1 TABLET BY MOUTH EVERY DAY 11/06/18   Rise Mu, PA-C  aspirin EC 81 MG EC tablet Take 1 tablet (81 mg total) by mouth daily. 05/11/15   Barrett, Erin R, PA-C  azelastine (ASTELIN) 0.1 % nasal spray Place 1 spray into both nostrils 2 (two) times daily as needed for rhinitis or allergies.     [provider]  bisoprolol (ZEBETA) 5 MG tablet Take 1 tablet (5 mg total) by mouth daily. 10/27/18   Deboraha Sprang, MD  ferrous sulfate 325 (65 FE) MG tablet Take 325 mg by mouth daily with breakfast.    [provider]  fluticasone (FLONASE) 50 MCG/ACT nasal spray Place 1 spray into both nostrils daily as needed for  allergies.     [provider]  furosemide (LASIX) 40 MG tablet Take 0.5 tablets (20 mg total) by mouth 2 (two) times daily. 10/27/18   Deboraha Sprang, MD  gabapentin (NEURONTIN) 100 MG capsule Take 200 mg by mouth at bedtime.  09/29/15   [provider]  ipratropium-albuterol (DUONEB) 0.5-2.5 (3) MG/3ML SOLN Take 3 mLs by nebulization every 6 (six) hours as needed. DX:J44.9 Patient taking differently: Take 3 mLs by nebulization every 6 (six) hours as needed (for wheezing/shortness of breath).  10/28/17   Flora Lipps, MD  levothyroxine (SYNTHROID, LEVOTHROID) 88 MCG tablet Take 88 mcg by mouth daily before breakfast.    [provider]  Multiple Vitamins-Minerals (PRESERVISION AREDS 2 PO) Take 2 tablets by mouth daily.    [provider]  pantoprazole (PROTONIX) 40 MG tablet Take 40 mg by mouth 2 (two) times daily.     [provider]  sacubitril-valsartan (ENTRESTO) 24-26 MG Take 1 tablet by mouth 2 (two) times daily. 10/27/18   Deboraha Sprang, MD  simvastatin (ZOCOR) 10 MG tablet Take 1 tablet (10 mg total) by mouth at bedtime. 08/03/18 02/04/19  Rise Mu, PA-C  spironolactone (ALDACTONE) 25 MG tablet Take 0.5 tablets (12.5 mg total) by mouth daily. 09/17/18 02/04/19  Rise Mu, PA-C  warfarin (COUMADIN) 2.5 MG tablet TAKE AS DIRECTED BY COUMADIN CLINIC 02/02/19   Deboraha Sprang, MD  warfarin (COUMADIN) 3 MG tablet Take 1 tablet (3 mg total) by mouth one time only at 6 PM. 07/22/18   Loletha Grayer, MD     Allergies Ace inhibitors; Amoxicillin-pot clavulanate; Penicillins; Nifedipine; Propranolol; Diazepam; Meperidine; and Propoxyphene  Family History  Problem Relation Age of Onset  . Heart disease Father   . Heart disease Brother   . CVA Mother   . Heart attack Paternal Uncle   . Breast cancer Maternal Aunt   . Breast cancer Paternal Aunt     Social History Social History   Tobacco Use  . Smoking status: Former Smoker  Packs/day:  1.50    Years: 40.00    Pack years: 60.00    Types: Cigarettes    Last attempt to quit: 04/20/1998    Years since quitting: 20.8  . Smokeless tobacco: Never Used  Substance Use Topics  . Alcohol use: Yes    Comment: 11/10/2015 "glass of wine a few times/year, if that"  . Drug use: No    Review of Systems  Constitutional: No dizziness  ENT: As above   Gastrointestinal:   No nausea, no vomiting.     Skin: Negative for rash. Neurological: Negative for headaches     ____________________________________________   PHYSICAL EXAM:  VITAL SIGNS: ED Triage Vitals  Enc Vitals Group     BP 02/05/19 1607 126/69     Pulse Rate 02/05/19 1607 62     Resp 02/05/19 1607 18     Temp 02/05/19 1607 97.7 F (36.5 C)     Temp Source 02/05/19 1607 Oral     SpO2 02/05/19 1607 (!) 88 %     Weight 02/05/19 1608 54 kg (119 lb 0.8 oz)     Height 02/05/19 1608 1.575 m (5\' 2" )     Head Circumference --      Peak Flow --      Pain Score 02/05/19 1608 0     Pain Loc --      Pain Edu? --      Excl. in [place]? --     Constitutional: Alert and oriented  Head: Atraumatic. Nose: Clamp appears to have controlled bleeding, clamp gradually removed no bleeding. Mouth/Throat: Mucous membranes are moist.  No active bleeding posterior pharynx  Respiratory: Normal respiratory effort.  No retractions.  Neurologic:  Normal speech and language. No gross focal neurologic deficits are appreciated.   Skin:  Skin is warm, dry and intact. No rash noted.   ____________________________________________   LABS (all labs ordered are listed, but only abnormal results are displayed)  Labs Reviewed  PROTIME-INR - Abnormal; Notable for the following components:      Result Value   Prothrombin Time 28.9 (*)    INR 2.8 (*)    All other components within normal limits  CBC - Abnormal; Notable for the following components:   RBC 3.62 (*)    Hemoglobin 11.5 (*)    MCV 103.0 (*)    All other components within  normal limits   ____________________________________________  EKG   ____________________________________________  RADIOLOGY  None ____________________________________________   PROCEDURES  Procedure(s) performed: No  Procedures   Critical Care performed: No ____________________________________________   INITIAL IMPRESSION / ASSESSMENT AND PLAN / ED COURSE  Pertinent labs & imaging results that were available during my care of the patient were reviewed by me and considered in my medical decision making (see chart for details).  Patient presents with epistaxis which is been controlled by nasal clamping.  After removal of nasal clamp we observe the patient for an additional hour no further bleeding.   ____________________________________________   FINAL CLINICAL IMPRESSION(S) / ED DIAGNOSES  Final diagnoses:  Epistaxis      NEW MEDICATIONS STARTED DURING THIS VISIT:  Discharge Medication List as of 02/05/2019  6:24 PM       Note:  This document was prepared using Dragon voice recognition software and may include unintentional dictation errors.   Lavonia Drafts, MD 02/05/19 2124

## 2019-02-05 NOTE — ED Notes (Signed)
Clamped nose and placed on 2L blow by, oxygen not rising. Will place on NRB.

## 2019-02-05 NOTE — Telephone Encounter (Addendum)
Contacted the cath lab to schedule a time for the pt right heart cat on 02/08/19. Cath scheduled for 02/08/19 @ 10:30am with Dr.Arida. Pt will require repeat COVID-19 testing. Spoke with [facility] pre-testing. Caryl Pina sts that it would be better if the pt come for testing today. The test need to be performed 3 hours prior to the procedure, they open at 8am and there would not be enough time allotted on 02/08/19.

## 2019-02-05 NOTE — Telephone Encounter (Signed)
Patient spouse calling States that patient went to get COVID test and it has given her a nose bleed they are unable to stop Would like to know what to do, feels they may have to go to Emergency Room Transferred to [facility]

## 2019-02-05 NOTE — Telephone Encounter (Signed)
Spoke with the pt who rqst that I speak with her husband. The pt will go to the drive up COVID screening test center @ [facility] today, hours of operation provided.Marland Kitchen Spoke with Caryl Pina and she is aware. Pt husband adv that the pt is to self quarantine until the procedure.  Reviewed lab results and Ryan's recommendation. Adv the pt that husband that the pt cath is schedule on 02/08/19 @ 10:30 am with Dr.Arida. the pt is to report to [place] one hour prior. Verbal pre-procedure instructions given.  NPO after midnight No need to hold coumadin Ok to take morning meds with a sip of water HOLD Lasix and Spironolactone that morning Adv the pt husband that he will be required to wait for the pt in the car.  Pt husband verbalized understanding and provided read back of the instructions. No questions at this time.

## 2019-02-08 ENCOUNTER — Encounter
Admission: RE | Disposition: A | Discharge status: Home / Self Care | Payer: Self-pay | Source: Home / Self Care | Attending: Cardiovascular Disease

## 2019-02-08 ENCOUNTER — Telehealth: Payer: Self-pay

## 2019-02-08 ENCOUNTER — Other Ambulatory Visit: Payer: Self-pay

## 2019-02-08 ENCOUNTER — Ambulatory Visit
Admission: RE | Admit: 2019-02-08 | Discharge: 2019-02-08 | Disposition: A | Discharge status: Home / Self Care | Payer: Medicare Other | Attending: Cardiovascular Disease | Admitting: Cardiovascular Disease

## 2019-02-08 ENCOUNTER — Encounter: Payer: Self-pay | Admitting: *Deleted

## 2019-02-08 DIAGNOSIS — K219 Gastro-esophageal reflux disease without esophagitis: Secondary | ICD-10-CM | POA: Insufficient documentation

## 2019-02-08 DIAGNOSIS — Z8249 Family history of ischemic heart disease and other diseases of the circulatory system: Secondary | ICD-10-CM | POA: Insufficient documentation

## 2019-02-08 DIAGNOSIS — J449 Chronic obstructive pulmonary disease, unspecified: Secondary | ICD-10-CM | POA: Diagnosis not present

## 2019-02-08 DIAGNOSIS — I272 Pulmonary hypertension, unspecified: Secondary | ICD-10-CM | POA: Diagnosis not present

## 2019-02-08 DIAGNOSIS — I442 Atrioventricular block, complete: Secondary | ICD-10-CM | POA: Insufficient documentation

## 2019-02-08 DIAGNOSIS — E039 Hypothyroidism, unspecified: Secondary | ICD-10-CM | POA: Insufficient documentation

## 2019-02-08 DIAGNOSIS — Z79899 Other long term (current) drug therapy: Secondary | ICD-10-CM | POA: Diagnosis not present

## 2019-02-08 DIAGNOSIS — I480 Paroxysmal atrial fibrillation: Secondary | ICD-10-CM | POA: Diagnosis not present

## 2019-02-08 DIAGNOSIS — Z87891 Personal history of nicotine dependence: Secondary | ICD-10-CM | POA: Insufficient documentation

## 2019-02-08 DIAGNOSIS — Z79890 Hormone replacement therapy (postmenopausal): Secondary | ICD-10-CM | POA: Insufficient documentation

## 2019-02-08 DIAGNOSIS — Z951 Presence of aortocoronary bypass graft: Secondary | ICD-10-CM | POA: Insufficient documentation

## 2019-02-08 DIAGNOSIS — Z885 Allergy status to narcotic agent status: Secondary | ICD-10-CM | POA: Insufficient documentation

## 2019-02-08 DIAGNOSIS — E785 Hyperlipidemia, unspecified: Secondary | ICD-10-CM | POA: Diagnosis not present

## 2019-02-08 DIAGNOSIS — Z880 Allergy status to penicillin: Secondary | ICD-10-CM | POA: Diagnosis not present

## 2019-02-08 DIAGNOSIS — I5023 Acute on chronic systolic (congestive) heart failure: Secondary | ICD-10-CM

## 2019-02-08 DIAGNOSIS — Z7901 Long term (current) use of anticoagulants: Secondary | ICD-10-CM | POA: Insufficient documentation

## 2019-02-08 DIAGNOSIS — I5042 Chronic combined systolic (congestive) and diastolic (congestive) heart failure: Secondary | ICD-10-CM | POA: Insufficient documentation

## 2019-02-08 DIAGNOSIS — Z950 Presence of cardiac pacemaker: Secondary | ICD-10-CM | POA: Diagnosis not present

## 2019-02-08 DIAGNOSIS — F419 Anxiety disorder, unspecified: Secondary | ICD-10-CM | POA: Insufficient documentation

## 2019-02-08 DIAGNOSIS — I428 Other cardiomyopathies: Secondary | ICD-10-CM | POA: Diagnosis not present

## 2019-02-08 DIAGNOSIS — I110 Hypertensive heart disease with heart failure: Secondary | ICD-10-CM | POA: Diagnosis not present

## 2019-02-08 DIAGNOSIS — Z7951 Long term (current) use of inhaled steroids: Secondary | ICD-10-CM | POA: Diagnosis not present

## 2019-02-08 DIAGNOSIS — Z7982 Long term (current) use of aspirin: Secondary | ICD-10-CM | POA: Diagnosis not present

## 2019-02-08 DIAGNOSIS — J9621 Acute and chronic respiratory failure with hypoxia: Secondary | ICD-10-CM | POA: Diagnosis not present

## 2019-02-08 DIAGNOSIS — I2510 Atherosclerotic heart disease of native coronary artery without angina pectoris: Secondary | ICD-10-CM | POA: Diagnosis not present

## 2019-02-08 DIAGNOSIS — H8109 Meniere's disease, unspecified ear: Secondary | ICD-10-CM | POA: Insufficient documentation

## 2019-02-08 DIAGNOSIS — Z952 Presence of prosthetic heart valve: Secondary | ICD-10-CM | POA: Insufficient documentation

## 2019-02-08 DIAGNOSIS — N179 Acute kidney failure, unspecified: Secondary | ICD-10-CM | POA: Diagnosis not present

## 2019-02-08 HISTORY — PX: RIGHT HEART CATH: CATH118263

## 2019-02-08 SURGERY — RIGHT HEART CATH
Anesthesia: Moderate Sedation | Laterality: Right

## 2019-02-08 MED ORDER — SODIUM CHLORIDE 0.9% FLUSH: 3.0000 mL | INTRAVENOUS | Status: DC | PRN

## 2019-02-08 MED ORDER — HEPARIN (PORCINE) IN NACL 1000-0.9 UT/500ML-% IV SOLN
INTRAVENOUS | Status: AC
  Filled 2019-02-08: qty 500

## 2019-02-08 MED ORDER — SODIUM CHLORIDE 0.9% FLUSH: 3.0000 mL | Freq: Two times a day (BID) | INTRAVENOUS | Status: DC

## 2019-02-08 MED ORDER — SODIUM CHLORIDE 0.9 % IV SOLN
INTRAVENOUS | Status: DC
  Administered 2019-02-08: 10:00:00 via INTRAVENOUS

## 2019-02-08 MED ORDER — SODIUM CHLORIDE 0.9 % IV SOLN: 250.0000 mL | INTRAVENOUS | Status: DC | PRN

## 2019-02-08 MED ORDER — FUROSEMIDE 40 MG PO TABS: ORAL_TABLET | ORAL | Status: DC

## 2019-02-08 SURGICAL SUPPLY — 5 items
CATH BALLN WEDGE 5F 110CM (CATHETERS) ×4 IMPLANT
GUIDEWIRE .025 260CM (WIRE) ×4 IMPLANT
KIT MANI 3VAL PERCEP (MISCELLANEOUS) ×4 IMPLANT
PACK CARDIAC CATH (CUSTOM PROCEDURE TRAY) ×4 IMPLANT
SHEATH GLIDE SLENDER 4/5FR (SHEATH) ×4 IMPLANT

## 2019-02-08 NOTE — Interval H&P Note (Signed)
History and Physical Interval Note:  02/08/2019 10:48 AM  [name]  has presented today for surgery, with the diagnosis of RT Cath     Acute on chronic hypoxic respiratory failure.  The various methods of treatment have been discussed with the patient and family. After consideration of risks, benefits and other options for treatment, the patient has consented to  Procedure(s): RIGHT HEART CATH (N/A) as a surgical intervention.  The patient's history has been reviewed, patient examined, no change in status, stable for surgery.  I have reviewed the patient's chart and labs.  Questions were answered to the patient's satisfaction.     Kathlyn Sacramento

## 2019-02-08 NOTE — Telephone Encounter (Signed)
Spoke with the pt who request that I speak with her husband. Adv pt husband that Dr. Fletcher Anon would like the pt to have a bmet in 1 wk. Order in [place]. Pt husband sts that the pt will have labs on 02/15/19. Adv him that pt is to report to [place] for lab. Pt echo is scheduled on 02/24/19. Pt scheduled with Dr. Fletcher Anon for first available on 03/11/19 @ 10:40am. Pt husband made aware of the appt date and time. They are aware that they will need to enter [place] for all appointments and to allot enough time to arrive to the scheduled appt. Pt husband verbalized understanding to the instructions given and voiced appreciation for the call.

## 2019-02-08 NOTE — Telephone Encounter (Signed)
-----   Message from Wellington Hampshire, MD sent at 02/08/2019 11:42 AM EDT ----- She is status post right heart catheterization today.  I increased her furosemide.  Please arrange for her to have basic metabolic profile in 1 week.  Schedule follow-up with me after her echocardiogram this month.

## 2019-02-15 ENCOUNTER — Other Ambulatory Visit
Admission: RE | Admit: 2019-02-15 | Discharge: 2019-02-15 | Disposition: A | Discharge status: Home / Self Care | Payer: Medicare Other | Source: Ambulatory Visit | Attending: Cardiovascular Disease | Admitting: Cardiovascular Disease

## 2019-02-15 ENCOUNTER — Other Ambulatory Visit: Payer: Self-pay

## 2019-02-15 DIAGNOSIS — I5023 Acute on chronic systolic (congestive) heart failure: Secondary | ICD-10-CM | POA: Insufficient documentation

## 2019-02-15 DIAGNOSIS — I5042 Chronic combined systolic (congestive) and diastolic (congestive) heart failure: Secondary | ICD-10-CM

## 2019-02-15 LAB — BASIC METABOLIC PANEL
Anion gap: 9 (ref 5–15)
BUN: 24 mg/dL — ABNORMAL HIGH (ref 8–23)
CO2: 33 mmol/L — ABNORMAL HIGH (ref 22–32)
Calcium: 8.7 mg/dL — ABNORMAL LOW (ref 8.9–10.3)
Chloride: 99 mmol/L (ref 98–111)
Creatinine, Ser: 0.92 mg/dL (ref 0.44–1.00)
GFR calc Af Amer: >60 mL/min (ref >60)
GFR calc non Af Amer: 58 mL/min — ABNORMAL LOW (ref >60)
Glucose, Bld: 117 mg/dL — ABNORMAL HIGH (ref 70–99)
Potassium: 4.3 mmol/L (ref 3.5–5.1)
Sodium: 141 mmol/L (ref 135–145)

## 2019-02-15 NOTE — Addendum Note (Signed)
Addended by: Santiago Bur on: 02/15/2019 09:36 AM   Modules accepted: Orders

## 2019-02-22 ENCOUNTER — Inpatient Hospital Stay (HOSPITAL_COMMUNITY)
Admission: AD | Admit: 2019-02-22 | Discharge: 2019-02-26 | DRG: 177 | Disposition: A | Discharge status: Home Health | Payer: Medicare Other | Source: Other Acute Inpatient Hospital | Attending: Family Medicine | Admitting: Family Medicine

## 2019-02-22 ENCOUNTER — Emergency Department: Payer: Medicare Other

## 2019-02-22 ENCOUNTER — Encounter: Payer: Self-pay | Admitting: Emergency Medicine

## 2019-02-22 ENCOUNTER — Emergency Department
Admission: EM | Admit: 2019-02-22 | Discharge: 2019-02-22 | Payer: Medicare Other | Attending: Student in an Organized Health Care Education/Training Program | Admitting: Student in an Organized Health Care Education/Training Program

## 2019-02-22 ENCOUNTER — Telehealth: Payer: Self-pay | Admitting: Cardiovascular Disease

## 2019-02-22 ENCOUNTER — Other Ambulatory Visit: Payer: Self-pay

## 2019-02-22 DIAGNOSIS — J449 Chronic obstructive pulmonary disease, unspecified: Secondary | ICD-10-CM | POA: Insufficient documentation

## 2019-02-22 DIAGNOSIS — I5023 Acute on chronic systolic (congestive) heart failure: Secondary | ICD-10-CM | POA: Diagnosis present

## 2019-02-22 DIAGNOSIS — Z880 Allergy status to penicillin: Secondary | ICD-10-CM | POA: Diagnosis not present

## 2019-02-22 DIAGNOSIS — T501X5A Adverse effect of loop [high-ceiling] diuretics, initial encounter: Secondary | ICD-10-CM | POA: Diagnosis present

## 2019-02-22 DIAGNOSIS — Z881 Allergy status to other antibiotic agents status: Secondary | ICD-10-CM

## 2019-02-22 DIAGNOSIS — J9690 Respiratory failure, unspecified, unspecified whether with hypoxia or hypercapnia: Secondary | ICD-10-CM

## 2019-02-22 DIAGNOSIS — Z7982 Long term (current) use of aspirin: Secondary | ICD-10-CM | POA: Insufficient documentation

## 2019-02-22 DIAGNOSIS — I4811 Longstanding persistent atrial fibrillation: Secondary | ICD-10-CM

## 2019-02-22 DIAGNOSIS — Z8679 Personal history of other diseases of the circulatory system: Secondary | ICD-10-CM

## 2019-02-22 DIAGNOSIS — I10 Essential (primary) hypertension: Secondary | ICD-10-CM | POA: Diagnosis not present

## 2019-02-22 DIAGNOSIS — I442 Atrioventricular block, complete: Secondary | ICD-10-CM | POA: Diagnosis present

## 2019-02-22 DIAGNOSIS — I110 Hypertensive heart disease with heart failure: Secondary | ICD-10-CM | POA: Insufficient documentation

## 2019-02-22 DIAGNOSIS — E785 Hyperlipidemia, unspecified: Secondary | ICD-10-CM | POA: Diagnosis present

## 2019-02-22 DIAGNOSIS — Z951 Presence of aortocoronary bypass graft: Secondary | ICD-10-CM | POA: Insufficient documentation

## 2019-02-22 DIAGNOSIS — R0602 Shortness of breath: Secondary | ICD-10-CM | POA: Diagnosis present

## 2019-02-22 DIAGNOSIS — R609 Edema, unspecified: Secondary | ICD-10-CM

## 2019-02-22 DIAGNOSIS — D638 Anemia in other chronic diseases classified elsewhere: Secondary | ICD-10-CM | POA: Diagnosis present

## 2019-02-22 DIAGNOSIS — J9601 Acute respiratory failure with hypoxia: Secondary | ICD-10-CM | POA: Diagnosis not present

## 2019-02-22 DIAGNOSIS — Z66 Do not resuscitate: Secondary | ICD-10-CM | POA: Diagnosis present

## 2019-02-22 DIAGNOSIS — Z7901 Long term (current) use of anticoagulants: Secondary | ICD-10-CM | POA: Diagnosis not present

## 2019-02-22 DIAGNOSIS — Z7951 Long term (current) use of inhaled steroids: Secondary | ICD-10-CM

## 2019-02-22 DIAGNOSIS — J440 Chronic obstructive pulmonary disease with acute lower respiratory infection: Secondary | ICD-10-CM | POA: Diagnosis not present

## 2019-02-22 DIAGNOSIS — I4821 Permanent atrial fibrillation: Secondary | ICD-10-CM | POA: Diagnosis present

## 2019-02-22 DIAGNOSIS — J1289 Other viral pneumonia: Secondary | ICD-10-CM | POA: Diagnosis present

## 2019-02-22 DIAGNOSIS — E039 Hypothyroidism, unspecified: Secondary | ICD-10-CM | POA: Diagnosis present

## 2019-02-22 DIAGNOSIS — R600 Localized edema: Secondary | ICD-10-CM | POA: Insufficient documentation

## 2019-02-22 DIAGNOSIS — Z79899 Other long term (current) drug therapy: Secondary | ICD-10-CM | POA: Diagnosis not present

## 2019-02-22 DIAGNOSIS — J9621 Acute and chronic respiratory failure with hypoxia: Secondary | ICD-10-CM | POA: Diagnosis present

## 2019-02-22 DIAGNOSIS — U071 COVID-19: Secondary | ICD-10-CM | POA: Insufficient documentation

## 2019-02-22 DIAGNOSIS — R7303 Prediabetes: Secondary | ICD-10-CM

## 2019-02-22 DIAGNOSIS — M19041 Primary osteoarthritis, right hand: Secondary | ICD-10-CM | POA: Diagnosis present

## 2019-02-22 DIAGNOSIS — Z952 Presence of prosthetic heart valve: Secondary | ICD-10-CM | POA: Diagnosis not present

## 2019-02-22 DIAGNOSIS — I5043 Acute on chronic combined systolic (congestive) and diastolic (congestive) heart failure: Secondary | ICD-10-CM | POA: Diagnosis present

## 2019-02-22 DIAGNOSIS — Z803 Family history of malignant neoplasm of breast: Secondary | ICD-10-CM

## 2019-02-22 DIAGNOSIS — I25119 Atherosclerotic heart disease of native coronary artery with unspecified angina pectoris: Secondary | ICD-10-CM | POA: Diagnosis present

## 2019-02-22 DIAGNOSIS — Z9889 Other specified postprocedural states: Secondary | ICD-10-CM | POA: Diagnosis not present

## 2019-02-22 DIAGNOSIS — I5042 Chronic combined systolic (congestive) and diastolic (congestive) heart failure: Secondary | ICD-10-CM | POA: Insufficient documentation

## 2019-02-22 DIAGNOSIS — K219 Gastro-esophageal reflux disease without esophagitis: Secondary | ICD-10-CM | POA: Diagnosis present

## 2019-02-22 DIAGNOSIS — E119 Type 2 diabetes mellitus without complications: Secondary | ICD-10-CM | POA: Diagnosis present

## 2019-02-22 DIAGNOSIS — Z9981 Dependence on supplemental oxygen: Secondary | ICD-10-CM | POA: Diagnosis not present

## 2019-02-22 DIAGNOSIS — Z950 Presence of cardiac pacemaker: Secondary | ICD-10-CM | POA: Diagnosis not present

## 2019-02-22 DIAGNOSIS — Z953 Presence of xenogenic heart valve: Secondary | ICD-10-CM

## 2019-02-22 DIAGNOSIS — Z8249 Family history of ischemic heart disease and other diseases of the circulatory system: Secondary | ICD-10-CM

## 2019-02-22 DIAGNOSIS — I272 Pulmonary hypertension, unspecified: Secondary | ICD-10-CM | POA: Diagnosis not present

## 2019-02-22 DIAGNOSIS — M19042 Primary osteoarthritis, left hand: Secondary | ICD-10-CM | POA: Diagnosis present

## 2019-02-22 DIAGNOSIS — H8109 Meniere's disease, unspecified ear: Secondary | ICD-10-CM | POA: Diagnosis not present

## 2019-02-22 DIAGNOSIS — I2510 Atherosclerotic heart disease of native coronary artery without angina pectoris: Secondary | ICD-10-CM | POA: Diagnosis present

## 2019-02-22 DIAGNOSIS — Z87891 Personal history of nicotine dependence: Secondary | ICD-10-CM | POA: Diagnosis not present

## 2019-02-22 DIAGNOSIS — E876 Hypokalemia: Secondary | ICD-10-CM | POA: Diagnosis present

## 2019-02-22 DIAGNOSIS — I4891 Unspecified atrial fibrillation: Secondary | ICD-10-CM | POA: Diagnosis present

## 2019-02-22 DIAGNOSIS — Z888 Allergy status to other drugs, medicaments and biological substances status: Secondary | ICD-10-CM | POA: Diagnosis not present

## 2019-02-22 DIAGNOSIS — Z79890 Hormone replacement therapy (postmenopausal): Secondary | ICD-10-CM

## 2019-02-22 DIAGNOSIS — D509 Iron deficiency anemia, unspecified: Secondary | ICD-10-CM | POA: Diagnosis present

## 2019-02-22 DIAGNOSIS — Z823 Family history of stroke: Secondary | ICD-10-CM

## 2019-02-22 HISTORY — DX: Respiratory failure, unspecified, unspecified whether with hypoxia or hypercapnia: J96.90

## 2019-02-22 LAB — CBC WITH DIFFERENTIAL/PLATELET
Abs Immature Granulocytes: 0.01 10*3/uL (ref 0.00–0.07)
Basophils Absolute: 0 10*3/uL (ref 0.0–0.1)
Basophils Relative: 0 %
Eosinophils Absolute: 0 10*3/uL (ref 0.0–0.5)
Eosinophils Relative: 0 %
HCT: 29.6 % — ABNORMAL LOW (ref 36.0–46.0)
Hemoglobin: 9.2 g/dL — ABNORMAL LOW (ref 12.0–15.0)
Immature Granulocytes: 0 %
Lymphocytes Relative: 8 %
Lymphs Abs: 0.5 10*3/uL — ABNORMAL LOW (ref 0.7–4.0)
MCH: 32.1 pg (ref 26.0–34.0)
MCHC: 31.1 g/dL (ref 30.0–36.0)
MCV: 103.1 fL — ABNORMAL HIGH (ref 80.0–100.0)
Monocytes Absolute: 0.4 10*3/uL (ref 0.1–1.0)
Monocytes Relative: 8 %
Neutro Abs: 4.6 10*3/uL (ref 1.7–7.7)
Neutrophils Relative %: 84 %
Platelets: 123 10*3/uL — ABNORMAL LOW (ref 150–400)
RBC: 2.87 MIL/uL — ABNORMAL LOW (ref 3.87–5.11)
RDW: 15.7 % — ABNORMAL HIGH (ref 11.5–15.5)
WBC: 5.5 10*3/uL (ref 4.0–10.5)
nRBC: 0 % (ref 0.0–0.2)

## 2019-02-22 LAB — TYPE AND SCREEN
ABO/RH(D): O POS
Antibody Screen: NEGATIVE

## 2019-02-22 LAB — COMPREHENSIVE METABOLIC PANEL
ALT: 60 U/L — ABNORMAL HIGH (ref 0–44)
AST: 40 U/L (ref 15–41)
Albumin: 3.5 g/dL (ref 3.5–5.0)
Alkaline Phosphatase: 73 U/L (ref 38–126)
Anion gap: 11 (ref 5–15)
BUN: 24 mg/dL — ABNORMAL HIGH (ref 8–23)
CO2: 32 mmol/L (ref 22–32)
Calcium: 8.2 mg/dL — ABNORMAL LOW (ref 8.9–10.3)
Chloride: 96 mmol/L — ABNORMAL LOW (ref 98–111)
Creatinine, Ser: 0.77 mg/dL (ref 0.44–1.00)
GFR calc Af Amer: >60 mL/min (ref >60)
GFR calc non Af Amer: >60 mL/min (ref >60)
Glucose, Bld: 127 mg/dL — ABNORMAL HIGH (ref 70–99)
Potassium: 3.3 mmol/L — ABNORMAL LOW (ref 3.5–5.1)
Sodium: 139 mmol/L (ref 135–145)
Total Bilirubin: 0.6 mg/dL (ref 0.3–1.2)
Total Protein: 6.6 g/dL (ref 6.5–8.1)

## 2019-02-22 LAB — GLUCOSE, CAPILLARY
Glucose-Capillary: 119 mg/dL — ABNORMAL HIGH (ref 70–99)
Glucose-Capillary: 230 mg/dL — ABNORMAL HIGH (ref 70–99)

## 2019-02-22 LAB — PROTIME-INR
INR: 3.5 — ABNORMAL HIGH (ref 0.8–1.2)
Prothrombin Time: 34.9 seconds — ABNORMAL HIGH (ref 11.4–15.2)

## 2019-02-22 LAB — LACTIC ACID, PLASMA: Lactic Acid, Venous: 1.1 mmol/L (ref 0.5–1.9)

## 2019-02-22 LAB — TROPONIN I: Troponin I: <0.03 ng/mL (ref <0.03)

## 2019-02-22 LAB — BRAIN NATRIURETIC PEPTIDE: B Natriuretic Peptide: 806 pg/mL — ABNORMAL HIGH (ref 0.0–100.0)

## 2019-02-22 LAB — PROCALCITONIN: Procalcitonin: <0.1 ng/mL

## 2019-02-22 LAB — FERRITIN: Ferritin: 173 ng/mL (ref 11–307)

## 2019-02-22 LAB — SARS CORONAVIRUS 2 BY RT PCR (HOSPITAL ORDER, PERFORMED IN ~~LOC~~ HOSPITAL LAB): SARS Coronavirus 2: POSITIVE — AB

## 2019-02-22 LAB — FIBRINOGEN: Fibrinogen: 627 mg/dL — ABNORMAL HIGH (ref 210–475)

## 2019-02-22 LAB — C-REACTIVE PROTEIN
CRP: 8.1 mg/dL — ABNORMAL HIGH (ref <1.0)
CRP: 8.9 mg/dL — ABNORMAL HIGH (ref <1.0)

## 2019-02-22 LAB — FIBRIN DERIVATIVES D-DIMER (ARMC ONLY): Fibrin derivatives D-dimer (ARMC): 450.63 ng/mL (FEU) (ref 0.00–499.00)

## 2019-02-22 LAB — LACTATE DEHYDROGENASE: LDH: 193 U/L — ABNORMAL HIGH (ref 98–192)

## 2019-02-22 LAB — TRIGLYCERIDES: Triglycerides: 48 mg/dL (ref <150)

## 2019-02-22 LAB — D-DIMER, QUANTITATIVE: D-Dimer, Quant: 0.41 ug/mL-FEU (ref 0.00–0.50)

## 2019-02-22 MED ORDER — ACETAMINOPHEN 325 MG PO TABS: 650.0000 mg | ORAL_TABLET | ORAL | Status: DC | PRN

## 2019-02-22 MED ORDER — ONDANSETRON HCL 4 MG PO TABS: 4.0000 mg | ORAL_TABLET | Freq: Four times a day (QID) | ORAL | Status: DC | PRN

## 2019-02-22 MED ORDER — AMIODARONE HCL 200 MG PO TABS
200.0000 mg | ORAL_TABLET | Freq: Every day | ORAL | Status: DC
  Administered 2019-02-23 – 2019-02-26 (×4): 200 mg via ORAL
  Filled 2019-02-22 (×4): qty 1

## 2019-02-22 MED ORDER — METHYLPREDNISOLONE SODIUM SUCC 40 MG IJ SOLR
30.0000 mg | Freq: Two times a day (BID) | INTRAMUSCULAR | Status: DC
  Administered 2019-02-22 – 2019-02-23 (×3): 30 mg via INTRAVENOUS
  Filled 2019-02-22 (×3): qty 1

## 2019-02-22 MED ORDER — SIMVASTATIN 10 MG PO TABS
10.0000 mg | ORAL_TABLET | Freq: Every day | ORAL | Status: DC
  Administered 2019-02-22 – 2019-02-25 (×4): 10 mg via ORAL
  Filled 2019-02-22 (×4): qty 1

## 2019-02-22 MED ORDER — POTASSIUM CHLORIDE CRYS ER 20 MEQ PO TBCR
40.0000 meq | EXTENDED_RELEASE_TABLET | Freq: Once | ORAL | Status: AC
  Administered 2019-02-22: 40 meq via ORAL
  Filled 2019-02-22: qty 2

## 2019-02-22 MED ORDER — METHYLPREDNISOLONE SODIUM SUCC 125 MG IJ SOLR
125.0000 mg | Freq: Once | INTRAMUSCULAR | Status: AC
  Administered 2019-02-22: 13:00:00 125 mg via INTRAVENOUS
  Filled 2019-02-22: qty 2

## 2019-02-22 MED ORDER — SENNOSIDES-DOCUSATE SODIUM 8.6-50 MG PO TABS: 1.0000 | ORAL_TABLET | Freq: Every evening | ORAL | Status: DC | PRN

## 2019-02-22 MED ORDER — FERROUS SULFATE 325 (65 FE) MG PO TABS
325.0000 mg | ORAL_TABLET | Freq: Every day | ORAL | Status: DC
  Administered 2019-02-23 – 2019-02-26 (×4): 325 mg via ORAL
  Filled 2019-02-22 (×4): qty 1

## 2019-02-22 MED ORDER — PANTOPRAZOLE SODIUM 40 MG PO TBEC
40.0000 mg | DELAYED_RELEASE_TABLET | Freq: Two times a day (BID) | ORAL | Status: DC
  Administered 2019-02-22 – 2019-02-26 (×8): 40 mg via ORAL
  Filled 2019-02-22 (×8): qty 1

## 2019-02-22 MED ORDER — LEVOTHYROXINE SODIUM 88 MCG PO TABS
88.0000 ug | ORAL_TABLET | Freq: Every day | ORAL | Status: DC
  Administered 2019-02-23 – 2019-02-26 (×4): 88 ug via ORAL
  Filled 2019-02-22 (×4): qty 1

## 2019-02-22 MED ORDER — BISOPROLOL FUMARATE 5 MG PO TABS
5.0000 mg | ORAL_TABLET | Freq: Every day | ORAL | Status: DC
  Administered 2019-02-23 – 2019-02-26 (×4): 5 mg via ORAL
  Filled 2019-02-22 (×4): qty 1

## 2019-02-22 MED ORDER — IPRATROPIUM-ALBUTEROL 0.5-2.5 (3) MG/3ML IN SOLN
3.0000 mL | Freq: Once | RESPIRATORY_TRACT | Status: AC
  Administered 2019-02-22: 10:00:00 3 mL via RESPIRATORY_TRACT
  Filled 2019-02-22: qty 3

## 2019-02-22 MED ORDER — FUROSEMIDE 10 MG/ML IJ SOLN
40.0000 mg | Freq: Two times a day (BID) | INTRAMUSCULAR | Status: DC
  Administered 2019-02-22 – 2019-02-25 (×6): 40 mg via INTRAVENOUS
  Filled 2019-02-22 (×6): qty 4

## 2019-02-22 MED ORDER — ALBUTEROL SULFATE HFA 108 (90 BASE) MCG/ACT IN AERS
1.0000 | INHALATION_SPRAY | RESPIRATORY_TRACT | Status: DC | PRN
  Filled 2019-02-22: qty 6.7

## 2019-02-22 MED ORDER — INSULIN ASPART 100 UNIT/ML ~~LOC~~ SOLN
0.0000 [IU] | Freq: Three times a day (TID) | SUBCUTANEOUS | Status: DC
  Administered 2019-02-23: 09:00:00 2 [IU] via SUBCUTANEOUS
  Administered 2019-02-23: 3 [IU] via SUBCUTANEOUS
  Administered 2019-02-23: 2 [IU] via SUBCUTANEOUS
  Administered 2019-02-24: 1 [IU] via SUBCUTANEOUS
  Administered 2019-02-24: 3 [IU] via SUBCUTANEOUS
  Administered 2019-02-25: 17:00:00 5 [IU] via SUBCUTANEOUS
  Administered 2019-02-25: 9 [IU] via SUBCUTANEOUS

## 2019-02-22 MED ORDER — ASPIRIN EC 81 MG PO TBEC
81.0000 mg | DELAYED_RELEASE_TABLET | Freq: Every day | ORAL | Status: DC
  Administered 2019-02-23 – 2019-02-26 (×4): 81 mg via ORAL
  Filled 2019-02-22 (×7): qty 1

## 2019-02-22 MED ORDER — AZELASTINE HCL 0.1 % NA SOLN: 1.0000 | Freq: Two times a day (BID) | NASAL | Status: DC | PRN

## 2019-02-22 MED ORDER — ONDANSETRON HCL 4 MG/2ML IJ SOLN: 4.0000 mg | Freq: Four times a day (QID) | INTRAMUSCULAR | Status: DC | PRN

## 2019-02-22 MED ORDER — FLUTICASONE PROPIONATE 50 MCG/ACT NA SUSP: 1.0000 | Freq: Every day | NASAL | Status: DC | PRN

## 2019-02-22 MED ORDER — INSULIN ASPART 100 UNIT/ML ~~LOC~~ SOLN
0.0000 [IU] | Freq: Every day | SUBCUTANEOUS | Status: DC
  Administered 2019-02-22: 2 [IU] via SUBCUTANEOUS

## 2019-02-22 MED ORDER — WARFARIN - PHARMACIST DOSING INPATIENT: Freq: Every day | Status: DC

## 2019-02-22 MED ORDER — FUROSEMIDE 10 MG/ML IJ SOLN
40.0000 mg | Freq: Once | INTRAMUSCULAR | Status: AC
  Administered 2019-02-22: 40 mg via INTRAVENOUS
  Filled 2019-02-22: qty 4

## 2019-02-22 MED ORDER — GABAPENTIN 100 MG PO CAPS
200.0000 mg | ORAL_CAPSULE | Freq: Every day | ORAL | Status: DC
  Administered 2019-02-22 – 2019-02-25 (×4): 200 mg via ORAL
  Filled 2019-02-22 (×4): qty 2

## 2019-02-22 NOTE — ED Notes (Signed)
[facility]  CALLED  PER  DR  ROBINSON MD 

## 2019-02-22 NOTE — ED Notes (Signed)
Report given to [facility], spoke with [place] and he stated [place] and would be here in about 30 minutes.

## 2019-02-22 NOTE — ED Notes (Signed)
Date and time results received: 02/22/19 10:59 AM  Test: DGLOV-56 Critical Value: POSITIVE  Name of Provider Notified: Dr. Quentin Cornwall  Orders Received? Or Actions Taken?: no new orders at this time

## 2019-02-22 NOTE — Telephone Encounter (Signed)
Pt c/o swelling: STAT is pt has developed SOB within 24 hours  1) How much weight have you gained and in what time span? No weight gain  2) If swelling, where is the swelling located? In both ankles  3) Are you currently taking a fluid pill? Yes, she has taken a "big dose"  4) Are you currently SOB? No, patient is on oxygen  5) Do you have a log of your daily weights (if so, list)? No weight gain  6) Have you gained 3 pounds in a day or 5 pounds in a week? no  7) Have you traveled recently? No  States pt has a bad cough, body aches, and loss of appetite, spouse is concerned patient has Corona Virus.

## 2019-02-22 NOTE — ED Triage Notes (Signed)
Pt reports cough and increased SOB with some congestion. Pt also reports swelling in her feet the last couple of days. Pt hard of hearing and pt husband with her to room and verbalizes understanding of visitor policy and after speaking with MD may be asked to wait in car.

## 2019-02-22 NOTE — ED Provider Notes (Signed)
[facility] Emergency Department Provider Note    First MD Initiated Contact with Patient 02/22/19 0935     (approximate)  I have reviewed the triage vital signs and the nursing notes.   HISTORY  Chief Complaint Leg Swelling, Shortness of Breath, and Cough    HPI [name] is a 84 y.o. female presents the ER for several days of progressively worsening shortness of breath leg swelling and orthopnea.  No recent fevers.  Did have recent right heart cath.  Has been on increasing doses of Lasix with no improvement.  She is scheduled for outpatient echocardiogram this Wednesday but due to her respiratory status was unable to wait that long.  Does have a history of COPD.  Does not currently smoke.  Denies any chest pain.  Does have chronic nausea.  No vomiting.    Past Medical History:  Diagnosis Date  . Anxiety   . Arthritis    "hands" (11/10/2015)  . CAD s/p CABG x 1    a. 04/2015 LIMA to LAD  . Chronic combined systolic (congestive) and diastolic (congestive) heart failure ([place])    a. 01/2015 Echo: EF 50-55%, Gr2 DD (preop valve surgery); b. 08/2015 Echo: EF 30-35%, diff HK. Gr2 DD; c. 09/2016 Echo: EF 50-55%. Nl fxning TV ring. Mildly dil LA.  Marland Kitchen COPD (chronic obstructive pulmonary disease) ([place])   . Coronary artery disease   . GERD (gastroesophageal reflux disease)   . History of colon polyps   . History of mitral valve replacement    a. 04/2015 s/p 27 mm [facility] Mitral bovine bioprosthetic tissue valve  . Hyperlipidemia   . Hypertension   . Hypothyroidism   . 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 04/2015 Complete bilateral atrial lesion set using cryothermy and bipolar radiofrequency ablation with clipping of LA appendage  . Meniere disease   . Meniere's disease   . On home oxygen therapy    "2L at night" (11/10/2015)  . PAF (paroxysmal atrial fibrillation) ([place])   . Pneumonia ~ 2010  . PONV (postoperative nausea and vomiting)    Pt felt  like eardrums were gonna pop  . Post-surgical complete heart block, symptomatic    a. 04/2015 s/p MDT [id number] Advisa DR MRI DC PPM.  . Presence of permanent cardiac pacemaker   . Pulmonary hypertension ([place])   . Surgical wound, non healing - chest wall 11/10/2015  . Tricuspid Regurgitation s/p Repair    a. 04/2015 s/p 26 mm Edwards mc3 ring annuloplasty  . Wound infection 10/13/2015   Superficial sternal wound infection   Family History  Problem Relation Age of Onset  . Heart disease Father   . Heart disease Brother   . CVA Mother   . Heart attack Paternal Uncle   . Breast cancer Maternal Aunt   . Breast cancer Paternal Aunt    Past Surgical History:  Procedure Laterality Date  . APPENDECTOMY    . APPLICATION OF A-CELL OF CHEST/ABDOMEN N/A 11/21/2015   Procedure: APPLICATION OF A-CELL OF CHEST/ABDOMEN;  Surgeon: Loel Lofty Dillingham, DO;  Location: [place];  Service: Plastics;  Laterality: N/A;  . APPLICATION OF A-CELL OF CHEST/ABDOMEN Right 12/21/2015   Procedure: APPLICATION OF A-CELL OF RIGHT CHEST;  Surgeon: Loel Lofty Dillingham, DO;  Location: [place];  Service: Plastics;  Laterality: Right;  . APPLICATION OF A-CELL OF CHEST/ABDOMEN Right 01/11/2016   Procedure: APPLICATION OF A-CELL OF RIGHT CHEST;  Surgeon: Loel Lofty Dillingham,  DO;  Location: [place];  Service: Plastics;  Laterality: Right;  . APPLICATION OF A-CELL OF CHEST/ABDOMEN Right 02/12/2016   Procedure: APPLICATION OF A-CELL TO RIGHT CHEST WOUND;  Surgeon: Loel Lofty Dillingham, DO;  Location: [place];  Service: Plastics;  Laterality: Right;  . APPLICATION OF WOUND VAC N/A 05/09/2015   Procedure: APPLICATION OF WOUND VAC;  Surgeon: Rexene Alberts, MD;  Location: [place];  Service: Thoracic;  Laterality: N/A;  . APPLICATION OF WOUND VAC N/A 11/13/2015   Procedure: APPLICATION OF WOUND VAC;  Surgeon: Rexene Alberts, MD;  Location: [place];  Service: Thoracic;  Laterality: N/A;  . APPLICATION OF WOUND VAC N/A 11/21/2015   Procedure: APPLICATION OF  WOUND VAC;  Surgeon: Loel Lofty Dillingham, DO;  Location: [place];  Service: Plastics;  Laterality: N/A;  . APPLICATION OF WOUND VAC Right 12/21/2015   Procedure: APPLICATION OF WOUND VAC to right chest wall ;  Surgeon: Loel Lofty Dillingham, DO;  Location: [place];  Service: Plastics;  Laterality: Right;  . APPLICATION OF WOUND VAC Right 01/11/2016   Procedure: APPLICATION OF WOUND VAC RIGHT CHEST ;  Surgeon: Loel Lofty Dillingham, DO;  Location: [place];  Service: Plastics;  Laterality: Right;  . APPLICATION OF WOUND VAC Right 01/24/2016   Procedure: APPLICATION OF WOUND VAC RIGHT CHEST WALL;  Surgeon: Loel Lofty Dillingham, DO;  Location: [place];  Service: Plastics;  Laterality: Right;  . APPLICATION OF WOUND VAC Right 02/12/2016   Procedure: RE-APPLICATION OF WOUND VAC TO RIGHT CHEST WOUND;  Surgeon: Loel Lofty Dillingham, DO;  Location: [place];  Service: Plastics;  Laterality: Right;  . BREAST BIOPSY Left 11/25/06   neg  . BREAST BIOPSY Left 01/20/12   /clip-neg  . CARDIAC CATHETERIZATION  11/2013   [facility]  . CARDIAC CATHETERIZATION  10/2014   [facility]  . CARDIAC VALVE REPLACEMENT    . CARDIOVERSION N/A 07/20/2018   Procedure: CARDIOVERSION;  Surgeon: Wellington Hampshire, MD;  Location: [facility];  Service: Cardiovascular;  Laterality: N/A;  . CATARACT EXTRACTION W/ INTRAOCULAR LENS  IMPLANT, BILATERAL Bilateral 2014  . CLIPPING OF ATRIAL APPENDAGE N/A 04/25/2015   Procedure: CLIPPING OF ATRIAL APPENDAGE;  Surgeon: Rexene Alberts, MD;  Location: [place];  Service: Open Heart Surgery;  Laterality: N/A;  . COCHLEAR IMPLANT Left 2005?  . COLONOSCOPY WITH PROPOFOL N/A 01/20/2018   Procedure: COLONOSCOPY WITH PROPOFOL;  Surgeon: Toledo, Benay Pike, MD;  Location: [facility];  Service: Gastroenterology;  Laterality: N/A;  . CORONARY ANGIOPLASTY    . CORONARY ARTERY BYPASS GRAFT N/A 04/25/2015   Procedure: CORONARY ARTERY BYPASS GRAFTING (CABG) x ONE, using left internal mammary artery;  Surgeon: Rexene Alberts, MD;   Location: [place];  Service: Open Heart Surgery;  Laterality: N/A;  . DILATION AND CURETTAGE OF UTERUS  "several before hysterectomy"  . EP IMPLANTABLE DEVICE N/A 05/02/2015   Procedure: Pacemaker Implant;  Surgeon: Thompson Grayer, MD;  Location: [place] CV [facility];  Service: Cardiovascular;  Laterality: N/A;  . ESOPHAGOGASTRODUODENOSCOPY (EGD) WITH PROPOFOL N/A 01/20/2018   Procedure: ESOPHAGOGASTRODUODENOSCOPY (EGD) WITH PROPOFOL;  Surgeon: Toledo, Benay Pike, MD;  Location: [facility];  Service: Gastroenterology;  Laterality: N/A;  . EYE SURGERY    . I&D EXTREMITY Right 12/06/2015   Procedure: IRRIGATION AND DEBRIDEMENT RIGHT CHEST WALL WITH ACELL PLACEMENT AND VAC;  Surgeon: Loel Lofty Dillingham, DO;  Location: [place];  Service: Plastics;  Laterality: Right;  . INCISION AND DRAINAGE OF WOUND N/A 11/21/2015   Procedure:  IRRIGATION AND DEBRIDEMENT WOUND;  Surgeon: Loel Lofty Dillingham, DO;  Location: [place];  Service: Plastics;  Laterality: N/A;  . INCISION AND DRAINAGE OF WOUND Right 12/21/2015   Procedure: IRRIGATION AND DEBRIDEMENT right chest wall WOUND;  Surgeon: Loel Lofty Dillingham, DO;  Location: [place];  Service: Plastics;  Laterality: Right;  right chest wall   . INCISION AND DRAINAGE OF WOUND Right 01/24/2016   Procedure: IRRIGATION AND DEBRIDEMENT RIGHT CHEST WALL WOUND;  Surgeon: Loel Lofty Dillingham, DO;  Location: [place];  Service: Plastics;  Laterality: Right;  . INCISION AND DRAINAGE OF WOUND Right 03/28/2016   Procedure: IRRIGATION AND DEBRIDEMENT RIGHT CHEST WALL WOUND WITH A Cell Placement;  Surgeon: Loel Lofty Dillingham, DO;  Location: [place];  Service: Plastics;  Laterality: Right;  . INCISION AND DRAINAGE OF WOUND Right 05/17/2016   Procedure: IRRIGATION AND DEBRIDEMENT OF RIGHT CHEST WOUND WITH A CELL PLACEMENT;  Surgeon: Loel Lofty Dillingham, DO;  Location: [place];  Service: Plastics;  Laterality: Right;  . INSERT / REPLACE / REMOVE PACEMAKER    . IRRIGATION AND DEBRIDEMENT OF WOUND WITH  SPLIT THICKNESS SKIN GRAFT Right 01/11/2016   Procedure: IRRIGATION AND DEBRIDEMENT OF RIGHT CHEST WOUND ;  Surgeon: Loel Lofty Dillingham, DO;  Location: [place];  Service: Plastics;  Laterality: Right;  . MASTECTOMY, PARTIAL Right 2002   positive  . MAZE N/A 04/25/2015   Procedure: MAZE;  Surgeon: Rexene Alberts, MD;  Location: [place];  Service: Open Heart Surgery;  Laterality: N/A;  . MITRAL VALVE REPAIR N/A 04/25/2015   Procedure: MITRAL VALVE  REPLACEMENT using a 27 mm Edwards Perimount Magna Mitral Ease Valve;  Surgeon: Rexene Alberts, MD;  Location: [place];  Service: Open Heart Surgery;  Laterality: N/A;  . RIGHT HEART CATH N/A 02/08/2019   Procedure: RIGHT HEART CATH;  Surgeon: Wellington Hampshire, MD;  Location: [place] [facility];  Service: Cardiovascular;  Laterality: N/A;  . SKIN SPLIT GRAFT Right 02/12/2016   Procedure: IRRIGATION AND DEBRIDEMENT RIGHT CHEST WOUND;  Surgeon: Loel Lofty Dillingham, DO;  Location: [place];  Service: Plastics;  Laterality: Right;  . STERNAL WIRES REMOVAL N/A 06/05/2015   Procedure: STERNAL WIRES REMOVAL;  Surgeon: Rexene Alberts, MD;  Location: [place];  Service: Thoracic;  Laterality: N/A;  . STERNAL WOUND DEBRIDEMENT N/A 05/09/2015   Procedure: STERNAL WOUND DEBRIDEMENT;  Surgeon: Rexene Alberts, MD;  Location: [place];  Service: Thoracic;  Laterality: N/A;  . STERNAL WOUND DEBRIDEMENT N/A 11/13/2015   Procedure: Excisional drainage of RIGHT Chest wall mass and breast mass ;  Surgeon: Rexene Alberts, MD;  Location: [place];  Service: Thoracic;  Laterality: N/A;  . TEE WITHOUT CARDIOVERSION N/A 04/25/2015   Procedure: TRANSESOPHAGEAL ECHOCARDIOGRAM (TEE);  Surgeon: Rexene Alberts, MD;  Location: [place];  Service: Open Heart Surgery;  Laterality: N/A;  . TONSILLECTOMY    . TRAM Right 11/18/2015   Procedure: VRAM Vertical Rectus Abdominus Muscle Flap;  Surgeon: Loel Lofty Dillingham, DO;  Location: [place];  Service: Plastics;  Laterality: Right;  RIght Back  . TRICUSPID VALVE  REPLACEMENT N/A 04/25/2015   Procedure: TRICUSPID VALVE REPAIR;  Surgeon: Rexene Alberts, MD;  Location: [place];  Service: Open Heart Surgery;  Laterality: N/A;  . VAGINAL HYSTERECTOMY     Patient Active Problem List   Diagnosis Date Noted  . Acute on chronic systolic heart failure ([place])   . Acute on chronic respiratory failure with hypoxia ([place]) 02/04/2019  .  Chronic diastolic CHF (congestive heart failure) ([place])   . S/P placement of cardiac pacemaker 05/02/15, medtronic 05/03/2015  . Bradycardia 05/03/2015  . S/P mitral valve replacement with bioprosthetic valve, tricuspid valve repair, maze procedure and CABG x1 04/25/2015  . S/P tricuspid valve repair 04/25/2015  . S/P Maze operation for atrial fibrillation 04/25/2015  . S/P CABG x 1 04/25/2015  . Coronary artery disease   . Pulmonary hypertension ([place])   . Tricuspid regurgitation   . Chronic combined systolic and diastolic CHF (congestive heart failure) ([place])   . Chronic systolic heart failure ([place]) 11/10/2014  . Essential hypertension 11/10/2014  . Mitral regurgitation 11/10/2014  . Hyperlipidemia 11/10/2014  . Disorder of mitral valve 11/10/2014  . Atrial fibrillation ([place]) 11/10/2014  . Acid reflux 05/10/2014  . Type 2 diabetes mellitus ([place]) 05/08/2014  . Auditory vertigo 05/08/2014  . Adult hypothyroidism 05/08/2014  . Hypercholesterolemia 05/08/2014  . H/O adenomatous polyp of colon 05/08/2014  . Benign essential HTN 05/08/2014  . Moderate COPD (chronic obstructive pulmonary disease) ([place]) 03/28/2014      Prior to Admission medications   Medication Sig Start Date End Date Taking? Authorizing Provider  amiodarone (PACERONE) 200 MG tablet TAKE 1 TABLET BY MOUTH EVERY DAY 11/06/18  Yes Dunn, Areta Haber, PA-C  aspirin EC 81 MG EC tablet Take 1 tablet (81 mg total) by mouth daily. 05/11/15  Yes Barrett, Erin R, PA-C  bisoprolol (ZEBETA) 5 MG tablet Take 1 tablet (5 mg total) by mouth daily. 10/27/18  Yes Deboraha Sprang, MD   ferrous sulfate 325 (65 FE) MG tablet Take 325 mg by mouth daily with breakfast.   Yes [provider]  furosemide (LASIX) 20 MG tablet Take 20-40 mg by mouth 2 (two) times a day. 01/30/19  Yes [provider]  gabapentin (NEURONTIN) 100 MG capsule Take 200 mg by mouth at bedtime.  09/29/15  Yes [provider]  levothyroxine (SYNTHROID, LEVOTHROID) 88 MCG tablet Take 88 mcg by mouth daily before breakfast.   Yes [provider]  Multiple Vitamins-Minerals (PRESERVISION AREDS 2 PO) Take 2 tablets by mouth daily.   Yes [provider]  pantoprazole (PROTONIX) 40 MG tablet Take 40 mg by mouth 2 (two) times daily.    Yes [provider]  sacubitril-valsartan (ENTRESTO) 24-26 MG Take 1 tablet by mouth 2 (two) times daily. 10/27/18  Yes Deboraha Sprang, MD  simvastatin (ZOCOR) 10 MG tablet Take 1 tablet (10 mg total) by mouth at bedtime. 08/03/18 02/22/19 Yes Dunn, Areta Haber, PA-C  warfarin (COUMADIN) 2.5 MG tablet TAKE AS DIRECTED BY COUMADIN CLINIC Patient taking differently: Take 1.25-2.5 mg by mouth daily at 6 PM. Take one tablet (2.5 mg) by mouth on Tuesday, Thursday, Saturday and Sunday. Take one-half tablet (1.25 mg) on Monday, Wednesday and Friday 02/02/19  Yes Deboraha Sprang, MD  acetaminophen (TYLENOL) 325 MG tablet Take 2 tablets (650 mg total) by mouth every 4 (four) hours as needed for mild pain (or Fever >/= 101). Patient not taking: Reported on 02/08/2019 05/11/15   Barrett, Lodema Hong, PA-C  AMBULATORY NON FORMULARY MEDICATION [facility] at [place] with tubing. DX: COPD DX Code: J44.9 01/15/17   Wilhelmina Mcardle, MD  azelastine (ASTELIN) 0.1 % nasal spray Place 1 spray into both nostrils 2 (two) times daily as needed for rhinitis or allergies.     [provider]  fluticasone (FLONASE) 50 MCG/ACT nasal spray Place 1 spray into both nostrils daily as needed for  allergies.     [provider]  ipratropium-albuterol (DUONEB)  0.5-2.5 (3) MG/3ML SOLN Take 3 mLs by nebulization every 6 (six) hours as needed. DX:J44.9 Patient not taking: Reported on 02/08/2019 10/28/17   Flora Lipps, MD  spironolactone (ALDACTONE) 25 MG tablet Take 0.5 tablets (12.5 mg total) by mouth daily. 09/17/18 02/08/19  Rise Mu, PA-C  warfarin (COUMADIN) 3 MG tablet Take 1 tablet (3 mg total) by mouth one time only at 6 PM. Patient not taking: Reported on 02/08/2019 07/22/18   Loletha Grayer, MD    Allergies Ace inhibitors, Amoxicillin-pot clavulanate, Penicillins, Nifedipine, Propranolol, Diazepam, Meperidine, and Propoxyphene    Social History Social History   Tobacco Use  . Smoking status: Former Smoker    Packs/day: 1.50    Years: 40.00    Pack years: 60.00    Types: Cigarettes    Quit date: 04/20/1998    Years since quitting: 20.8  . Smokeless tobacco: Never Used  Substance Use Topics  . Alcohol use: Yes    Comment: 11/10/2015 "glass of wine a few times/year, if that"  . Drug use: No    Review of Systems Patient denies headaches, rhinorrhea, blurry vision, numbness, shortness of breath, chest pain, edema, cough, abdominal pain, nausea, vomiting, diarrhea, dysuria, fevers, rashes or hallucinations unless otherwise stated above in HPI. ____________________________________________   PHYSICAL EXAM:  VITAL SIGNS: Vitals:   02/22/19 1000 02/22/19 1030  BP: (!) 124/51 (!) 113/47  Pulse: (!) 59 (!) 59  Resp: (!) 27 20  Temp:    SpO2: 100% 98%    Constitutional: Alert and oriented HOH Eyes: Conjunctivae are normal.  Head: Atraumatic. Nose: No congestion/rhinnorhea. Mouth/Throat: Mucous membranes are moist.   Neck: No stridor. Painless ROM.  Cardiovascular: Normal rate, regular rhythm. Grossly normal heart sounds.  Good peripheral circulation. Respiratory: tachypnea speaking in short phrases, inspiratory posterior crackles with expiratory wheeze Gastrointestinal: Soft and nontender. No distention. No abdominal bruits. No  CVA tenderness. Genitourinary: deferred Musculoskeletal: No lower extremity tenderness, 2+ edema.  No joint effusions. Neurologic:  Normal speech and language. No gross focal neurologic deficits are appreciated. No facial droop Skin:  Skin is warm, dry and intact. No rash noted. Psychiatric: Mood and affect are normal. Speech and behavior are normal.  ____________________________________________   LABS (all labs ordered are listed, but only abnormal results are displayed)  Results for orders placed or performed during the hospital encounter of 02/22/19 (from the past 24 hour(s))  CBC with Differential/Platelet     Status: Abnormal   Collection Time: 02/22/19  9:50 AM  Result Value Ref Range   WBC 5.5 4.0 - 10.5 K/uL   RBC 2.87 (L) 3.87 - 5.11 MIL/uL   Hemoglobin 9.2 (L) 12.0 - 15.0 g/dL   HCT 29.6 (L) 36.0 - 46.0 %   MCV 103.1 (H) 80.0 - 100.0 fL   MCH 32.1 26.0 - 34.0 pg   MCHC 31.1 30.0 - 36.0 g/dL   RDW 15.7 (H) 11.5 - 15.5 %   Platelets 123 (L) 150 - 400 K/uL   nRBC 0.0 0.0 - 0.2 %   Neutrophils Relative % 84 %   Neutro Abs 4.6 1.7 - 7.7 K/uL   Lymphocytes Relative 8 %   Lymphs Abs 0.5 (L) 0.7 - 4.0 K/uL   Monocytes Relative 8 %   Monocytes Absolute 0.4 0.1 - 1.0 K/uL   Eosinophils Relative 0 %   Eosinophils Absolute 0.0 0.0 - 0.5 K/uL   Basophils Relative 0 %  Basophils Absolute 0.0 0.0 - 0.1 K/uL   Immature Granulocytes 0 %   Abs Immature Granulocytes 0.01 0.00 - 0.07 K/uL  Comprehensive metabolic panel     Status: Abnormal   Collection Time: 02/22/19  9:50 AM  Result Value Ref Range   Sodium 139 135 - 145 mmol/L   Potassium 3.3 (L) 3.5 - 5.1 mmol/L   Chloride 96 (L) 98 - 111 mmol/L   CO2 32 22 - 32 mmol/L   Glucose, Bld 127 (H) 70 - 99 mg/dL   BUN 24 (H) 8 - 23 mg/dL   Creatinine, Ser 0.77 0.44 - 1.00 mg/dL   Calcium 8.2 (L) 8.9 - 10.3 mg/dL   Total Protein 6.6 6.5 - 8.1 g/dL   Albumin 3.5 3.5 - 5.0 g/dL   AST 40 15 - 41 U/L   ALT 60 (H) 0 - 44 U/L    Alkaline Phosphatase 73 38 - 126 U/L   Total Bilirubin 0.6 0.3 - 1.2 mg/dL   GFR calc non Af Amer >60 >60 mL/min   GFR calc Af Amer >60 >60 mL/min   Anion gap 11 5 - 15  Troponin I - ONCE - STAT     Status: None   Collection Time: 02/22/19  9:50 AM  Result Value Ref Range   Troponin I <0.03 <0.03 ng/mL  Brain natriuretic peptide     Status: Abnormal   Collection Time: 02/22/19  9:50 AM  Result Value Ref Range   B Natriuretic Peptide 806.0 (H) 0.0 - 100.0 pg/mL  SARS Coronavirus 2 (CEPHEID- Performed in [place] hospital lab), Hosp Order     Status: Abnormal   Collection Time: 02/22/19  9:50 AM   Specimen: Nasopharyngeal Swab  Result Value Ref Range   SARS Coronavirus 2 POSITIVE (A) NEGATIVE  Protime-INR     Status: Abnormal   Collection Time: 02/22/19  9:50 AM  Result Value Ref Range   Prothrombin Time 34.9 (H) 11.4 - 15.2 seconds   INR 3.5 (H) 0.8 - 1.2   ____________________________________________  EKG My review and personal interpretation at Time: 9:45   Indication: sob  Rate: 60  Rhythm: av paced Axis: left Other: abnml ekg ____________________________________________  RADIOLOGY  I personally reviewed all radiographic images ordered to evaluate for the above acute complaints and reviewed radiology reports and findings.  These findings were personally discussed with the patient.  Please see medical record for radiology report.  ____________________________________________   PROCEDURES  Procedure(s) performed:  Procedures    Critical Care performed: no ____________________________________________   INITIAL IMPRESSION / ASSESSMENT AND PLAN / ED COURSE  Pertinent labs & imaging results that were available during my care of the patient were reviewed by me and considered in my medical decision making (see chart for details).   DDX: Asthma, copd, CHF, pna, ptx, malignancy, Pe, anemia   [name] is a 84 y.o. who presents to the ED with symptoms as  described above.  She is currently afebrile but does have increased work of breathing with respiratory exam as described above.  Supplemental oxygen was increased to 3 with improvement in symptoms.  She denies any pain.  Blood will be sent for the by differential.  The patient will be placed on continuous pulse oximetry and telemetry for monitoring.  Laboratory evaluation will be sent to evaluate for the above complaints.     Clinical Course as of Feb 22 1124  Mon Feb 22, 2019  1110 Patient still with some increased  work of breathing.  Did have improvement after nebulizer.  Coronavirus is positive.  Patient clinically also looks volume overloaded therefore I do feel that she would benefit from diuresis given her complexity with coronavirus I do feel that she would benefit from respiratory observation as well as medical management in the hospital setting.  Have discussed with the patient and available family all diagnostics and treatments performed thus far and all questions were answered to the best of my ability. The patient demonstrates understanding and agreement with plan.    [PR]    Clinical Course User Index [PR] Merlyn Lot, MD    The patient was evaluated in Emergency Department today for the symptoms described in the history of present illness. He/she was evaluated in the context of the global COVID-19 pandemic, which necessitated consideration that the patient might be at risk for infection with the SARS-CoV-2 virus that causes COVID-19. Institutional protocols and algorithms that pertain to the evaluation of patients at risk for COVID-19 are in a state of rapid change based on information released by regulatory bodies including the CDC and federal and state organizations. These policies and algorithms were followed during the patient's care in the ED.  As part of my medical decision making, I reviewed the following data within the [facility] notes reviewed and  incorporated, Labs reviewed, notes from prior ED visits and [place] Controlled Substance Database   ____________________________________________   FINAL CLINICAL IMPRESSION(S) / ED DIAGNOSES  Final diagnoses:  Acute on chronic respiratory failure with hypoxia (HCC)  Peripheral edema  COVID-19 virus detected      NEW MEDICATIONS STARTED DURING THIS VISIT:  New Prescriptions   No medications on file     Note:  This document was prepared using Dragon voice recognition software and may include unintentional dictation errors.    Merlyn Lot, MD 02/22/19 1125

## 2019-02-22 NOTE — H&P (Signed)
History and Physical    [name] HGD:[id number] DOB: [birth year] DOA: (Not on file)  I have briefly reviewed the patient's prior medical records in [place]  PCP: Ezequiel Kayser, MD  Patient coming from: [facility]  Chief Complaint: shortness of breath, swelling  HPI: [name] is a 84 y.o. female with medical history significant of COPD with chronic hypoxia, 2 L [place] at home, chronic systolic CHF with most recent EF 35-40% in February 2020, severe pulmonary hypertension with most recent right heart cath 2 weeks ago June 1, hypertension, hyperlipidemia, A. fib status post maze operation currently with a pacemaker and on chronic anticoagulation with Coumadin, type 2 diabetes mellitus, coronary artery disease status post CABG presents to the hospital with chief complaint of shortness of breath, lower extremity swelling.  She was seen by cardiology at the beginning of June for right cath, was felt to be volume overloaded and her diuretics were increased at that time.  Patient tells me today that over the last 10 days she has been feeling progressively short of breath, has noticed that she has had worsening ankle swelling and difficulties with ambulation due to dyspnea.  She reports compliance with her diuretics as well as compliance with her diet and only eats out about once a week.  She denies any chest pain.  She also reports intermittent wheezing.  She denies any abdominal pain, does complain of nausea but no vomiting, no diarrhea.  She denies any lightheadedness or dizziness.  ED Course: In the emergency room patient is afebrile 98.8, RR 19-27, heart rate in the 60s, blood pressure is normal 1 teens-120s, and she is satting 100% on 2 L nasal cannula.  Blood work reveals normal renal function with a creatinine 0.7, potassium 3.3.  Her BNP is elevated 806, troponin is negative.  Lactic acid is normal.  CBC shows platelets of 123 and mild anemia with a hemoglobin of 9.2.  Her INR is 3.5.  Chest x-ray shows chronic cardiomegaly with pulmonary venous hypertension without edema, no apparent infiltrates.  Coronavirus testing was done and returned positive.  We are asked to admit.  Case was discussed by EDP with cardiology  Review of Systems: As per HPI otherwise 10 point review of systems negative.   Past Medical History:  Diagnosis Date   Anxiety    Arthritis    "hands" (11/10/2015)   CAD s/p CABG x 1    a. 04/2015 [place] to LAD   Chronic combined systolic (congestive) and diastolic (congestive) heart failure ([place])    a. 01/2015 Echo: EF 50-55%, Gr2 DD (preop valve surgery); b. 08/2015 Echo: EF 30-35%, diff HK. Gr2 DD; c. 09/2016 Echo: EF 50-55%. Nl fxning TV ring. Mildly dil LA.   COPD (chronic obstructive pulmonary disease) (HCC)    Coronary artery disease    GERD (gastroesophageal reflux disease)    History of colon polyps    History of mitral valve replacement    a. 04/2015 s/p 27 mm [facility] Mitral bovine bioprosthetic tissue valve   Hyperlipidemia    Hypertension    Hypothyroidism    Maze operation for AF w/ LAA clipping    a. 04/2015 Complete bilateral atrial lesion set using cryothermy and bipolar radiofrequency ablation with clipping of LA appendage   Meniere disease    Meniere's disease    On home oxygen therapy    "2L at night" (11/10/2015)   PAF (paroxysmal atrial fibrillation) ([place])    Pneumonia ~ 2010  PONV (postoperative nausea and vomiting)    Pt felt like eardrums were gonna pop   Post-surgical complete heart block, symptomatic    a. 04/2015 s/p MDT [id number] [place]visa DR MRI DC PPM.   Presence of permanent cardiac pacemaker    Pulmonary hypertension ([place])    Surgical wound, non healing - chest wall 11/10/2015   Tricuspid Regurgitation s/p Repair    a. 04/2015 s/p 26 mm Edwards mc3 ring annuloplasty   Wound infection 10/13/2015   Superficial sternal wound infection    Past Surgical History:  Procedure Laterality Date   APPENDECTOMY      APPLICATION OF A-CELL OF CHEST/ABDOMEN N/A 11/21/2015   Procedure: APPLICATION OF A-CELL OF CHEST/ABDOMEN;  Surgeon: Loel Lofty Dillingham, DO;  Location: [place];  Service: Plastics;  Laterality: N/A;   APPLICATION OF A-CELL OF CHEST/ABDOMEN Right 12/21/2015   Procedure: APPLICATION OF A-CELL OF RIGHT CHEST;  Surgeon: Loel Lofty Dillingham, DO;  Location: [place];  Service: Plastics;  Laterality: Right;   APPLICATION OF A-CELL OF CHEST/ABDOMEN Right 01/11/2016   Procedure: APPLICATION OF A-CELL OF RIGHT CHEST;  Surgeon: Loel Lofty Dillingham, DO;  Location: [place];  Service: Plastics;  Laterality: Right;   APPLICATION OF A-CELL OF CHEST/ABDOMEN Right 02/12/2016   Procedure: APPLICATION OF A-CELL TO RIGHT CHEST WOUND;  Surgeon: Loel Lofty Dillingham, DO;  Location: [place];  Service: Plastics;  Laterality: Right;   APPLICATION OF WOUND VAC N/A 05/09/2015   Procedure: APPLICATION OF WOUND VAC;  Surgeon: Rexene Alberts, MD;  Location: [place];  Service: Thoracic;  Laterality: N/A;   APPLICATION OF WOUND VAC N/A 11/13/2015   Procedure: APPLICATION OF WOUND VAC;  Surgeon: Rexene Alberts, MD;  Location: [place];  Service: Thoracic;  Laterality: N/A;   APPLICATION OF WOUND VAC N/A 11/21/2015   Procedure: APPLICATION OF WOUND VAC;  Surgeon: Loel Lofty Dillingham, DO;  Location: [place];  Service: Plastics;  Laterality: N/A;   APPLICATION OF WOUND VAC Right 12/21/2015   Procedure: APPLICATION OF WOUND VAC to right chest wall ;  Surgeon: Loel Lofty Dillingham, DO;  Location: [place];  Service: Plastics;  Laterality: Right;   APPLICATION OF WOUND VAC Right 01/11/2016   Procedure: APPLICATION OF WOUND VAC RIGHT CHEST ;  Surgeon: Loel Lofty Dillingham, DO;  Location: [place];  Service: Plastics;  Laterality: Right;   APPLICATION OF WOUND VAC Right 01/24/2016   Procedure: APPLICATION OF WOUND VAC RIGHT CHEST WALL;  Surgeon: Loel Lofty Dillingham, DO;  Location: [place];  Service: Plastics;  Laterality: Right;   APPLICATION OF WOUND VAC Right  02/12/2016   Procedure: RE-APPLICATION OF WOUND VAC TO RIGHT CHEST WOUND;  Surgeon: Loel Lofty Dillingham, DO;  Location: [place];  Service: Plastics;  Laterality: Right;   BREAST BIOPSY Left 11/25/06   neg   BREAST BIOPSY Left 01/20/12   /clip-neg   CARDIAC CATHETERIZATION  11/2013   [facility]   CARDIAC CATHETERIZATION  10/2014   [facility]   CARDIAC VALVE REPLACEMENT     CARDIOVERSION N/A 07/20/2018   Procedure: CARDIOVERSION;  Surgeon: Wellington Hampshire, MD;  Location: [facility];  Service: Cardiovascular;  Laterality: N/A;   CATARACT EXTRACTION W/ INTRAOCULAR LENS  IMPLANT, BILATERAL Bilateral 2014   CLIPPING OF ATRIAL APPENDAGE N/A 04/25/2015   Procedure: CLIPPING OF ATRIAL APPENDAGE;  Surgeon: Rexene Alberts, MD;  Location: [place];  Service: Open Heart Surgery;  Laterality: N/A;   COCHLEAR IMPLANT Left 2005?   COLONOSCOPY WITH PROPOFOL N/A 01/20/2018  Procedure: COLONOSCOPY WITH PROPOFOL;  Surgeon: Toledo, Benay Pike, MD;  Location: [facility];  Service: Gastroenterology;  Laterality: N/A;   CORONARY ANGIOPLASTY     CORONARY ARTERY BYPASS GRAFT N/A 04/25/2015   Procedure: CORONARY ARTERY BYPASS GRAFTING (CABG) x ONE, using left internal mammary artery;  Surgeon: Rexene Alberts, MD;  Location: [place];  Service: Open Heart Surgery;  Laterality: N/A;   DILATION AND CURETTAGE OF UTERUS  "several before hysterectomy"   EP IMPLANTABLE DEVICE N/A 05/02/2015   Procedure: Pacemaker Implant;  Surgeon: Thompson Grayer, MD;  Location: [place] [facility];  Service: Cardiovascular;  Laterality: N/A;   ESOPHAGOGASTRODUODENOSCOPY (EGD) WITH PROPOFOL N/A 01/20/2018   Procedure: ESOPHAGOGASTRODUODENOSCOPY (EGD) WITH PROPOFOL;  Surgeon: Toledo, Benay Pike, MD;  Location: [facility];  Service: Gastroenterology;  Laterality: N/A;   EYE SURGERY     I&D EXTREMITY Right 12/06/2015   Procedure: IRRIGATION AND DEBRIDEMENT RIGHT CHEST WALL WITH ACELL PLACEMENT AND VAC;  Surgeon: Loel Lofty Dillingham, DO;  Location:  [place];  Service: Plastics;  Laterality: Right;   INCISION AND DRAINAGE OF WOUND N/A 11/21/2015   Procedure: IRRIGATION AND DEBRIDEMENT WOUND;  Surgeon: Loel Lofty Dillingham, DO;  Location: [place];  Service: Plastics;  Laterality: N/A;   INCISION AND DRAINAGE OF WOUND Right 12/21/2015   Procedure: IRRIGATION AND DEBRIDEMENT right chest wall WOUND;  Surgeon: Loel Lofty Dillingham, DO;  Location: [place];  Service: Plastics;  Laterality: Right;  right chest wall    INCISION AND DRAINAGE OF WOUND Right 01/24/2016   Procedure: IRRIGATION AND DEBRIDEMENT RIGHT CHEST WALL WOUND;  Surgeon: Loel Lofty Dillingham, DO;  Location: [place];  Service: Plastics;  Laterality: Right;   INCISION AND DRAINAGE OF WOUND Right 03/28/2016   Procedure: IRRIGATION AND DEBRIDEMENT RIGHT CHEST WALL WOUND WITH A Cell Placement;  Surgeon: Loel Lofty Dillingham, DO;  Location: [place];  Service: Plastics;  Laterality: Right;   INCISION AND DRAINAGE OF WOUND Right 05/17/2016   Procedure: IRRIGATION AND DEBRIDEMENT OF RIGHT CHEST WOUND WITH A CELL PLACEMENT;  Surgeon: Loel Lofty Dillingham, DO;  Location: [place];  Service: Plastics;  Laterality: Right;   INSERT / REPLACE / REMOVE PACEMAKER     IRRIGATION AND DEBRIDEMENT OF WOUND WITH SPLIT THICKNESS SKIN GRAFT Right 01/11/2016   Procedure: IRRIGATION AND DEBRIDEMENT OF RIGHT CHEST WOUND ;  Surgeon: Loel Lofty Dillingham, DO;  Location: [place];  Service: Plastics;  Laterality: Right;   MASTECTOMY, PARTIAL Right 2002   positive   MAZE N/A 04/25/2015   Procedure: MAZE;  Surgeon: Rexene Alberts, MD;  Location: [place];  Service: Open Heart Surgery;  Laterality: N/A;   MITRAL VALVE REPAIR N/A 04/25/2015   Procedure: MITRAL VALVE  REPLACEMENT using a 27 mm Edwards Perimount Magna Mitral Ease Valve;  Surgeon: Rexene Alberts, MD;  Location: [place];  Service: Open Heart Surgery;  Laterality: N/A;   RIGHT HEART CATH N/A 02/08/2019   Procedure: RIGHT HEART CATH;  Surgeon: Wellington Hampshire, MD;  Location:  [place] [facility];  Service: Cardiovascular;  Laterality: N/A;   SKIN SPLIT GRAFT Right 02/12/2016   Procedure: IRRIGATION AND DEBRIDEMENT RIGHT CHEST WOUND;  Surgeon: Loel Lofty Dillingham, DO;  Location: [place];  Service: Plastics;  Laterality: Right;   STERNAL WIRES REMOVAL N/A 06/05/2015   Procedure: STERNAL WIRES REMOVAL;  Surgeon: Rexene Alberts, MD;  Location: [place];  Service: Thoracic;  Laterality: N/A;   STERNAL WOUND DEBRIDEMENT N/A 05/09/2015   Procedure: STERNAL WOUND  DEBRIDEMENT;  Surgeon: Rexene Alberts, MD;  Location: [place];  Service: Thoracic;  Laterality: N/A;   STERNAL WOUND DEBRIDEMENT N/A 11/13/2015   Procedure: Excisional drainage of RIGHT Chest wall mass and breast mass ;  Surgeon: Rexene Alberts, MD;  Location: [place];  Service: Thoracic;  Laterality: N/A;   TEE WITHOUT CARDIOVERSION N/A 04/25/2015   Procedure: TRANSESOPHAGEAL ECHOCARDIOGRAM (TEE);  Surgeon: Rexene Alberts, MD;  Location: [place];  Service: Open Heart Surgery;  Laterality: N/A;   TONSILLECTOMY     TRAM Right 11/18/2015   Procedure: VRAM Vertical Rectus Abdominus Muscle Flap;  Surgeon: Loel Lofty Dillingham, DO;  Location: [place];  Service: Plastics;  Laterality: Right;  RIght Back   TRICUSPID VALVE REPLACEMENT N/A 04/25/2015   Procedure: TRICUSPID VALVE REPAIR;  Surgeon: Rexene Alberts, MD;  Location: [place];  Service: Open Heart Surgery;  Laterality: N/A;   VAGINAL HYSTERECTOMY       reports that she quit smoking about 20 years ago. Her smoking use included cigarettes. She has a 60.00 pack-year smoking history. She has never used smokeless tobacco. She reports current alcohol use. She reports that she does not use drugs.  Allergies  Allergen Reactions   Ace Inhibitors Cough and Other (See Comments)   Amoxicillin-Pot Clavulanate Swelling and Other (See Comments)    JOINTS, PAIN Joint pain    Penicillins Swelling and Other (See Comments)    SWOLLEN JOINTS  Has patient had a PCN reaction causing  immediate rash, facial/tongue/throat swelling, SOB or lightheadedness with hypotension: Yes Has patient had a PCN reaction causing severe rash involving mucus membranes or skin necrosis: No Has patient had a PCN reaction that required hospitalization No Has patient had a PCN reaction occurring within the last 10 years: No If all of the above answers are "NO", then may proceed with Cephalosporin use.  Swollen joints   Nifedipine Other (See Comments)    UNSPECIFIED UNSPECIFIED    Propranolol Other (See Comments)    UNSPECIFIED UNSPECIFIED    Diazepam Other (See Comments)    Made her "hyper" Made her "hyper" Made her "hyper"    Meperidine Other (See Comments)    Made her "hyper" Made her "hyper" Made her "hyper"    Propoxyphene Other (See Comments)    Made her "hyper" Made her "hyper" Made her "hyper"     Family History  Problem Relation Age of Onset   Heart disease Father    Heart disease Brother    CVA Mother    Heart attack Paternal Uncle    Breast cancer Maternal Aunt    Breast cancer Paternal Aunt     Prior to Admission medications   Medication Sig Start Date End Date Taking? Authorizing Provider  acetaminophen (TYLENOL) 325 MG tablet Take 2 tablets (650 mg total) by mouth every 4 (four) hours as needed for mild pain (or Fever >/= 101). Patient not taking: Reported on 02/08/2019 05/11/15   Barrett, Lodema Hong, PA-C  AMBULATORY NON FORMULARY MEDICATION [facility] at [place] with tubing. DX: COPD DX Code: J44.9 01/15/17   Wilhelmina Mcardle, MD  amiodarone (PACERONE) 200 MG tablet TAKE 1 TABLET BY MOUTH EVERY DAY 11/06/18   Rise Mu, PA-C  aspirin EC 81 MG EC tablet Take 1 tablet (81 mg total) by mouth daily. 05/11/15   Barrett, Erin R, PA-C  azelastine (ASTELIN) 0.1 % nasal spray Place 1 spray into both nostrils 2 (two) times daily as needed for rhinitis or  allergies.     [provider]  bisoprolol (ZEBETA) 5 MG tablet Take 1 tablet (5 mg total) by  mouth daily. 10/27/18   Deboraha Sprang, MD  ferrous sulfate 325 (65 FE) MG tablet Take 325 mg by mouth daily with breakfast.    [provider]  fluticasone (FLONASE) 50 MCG/ACT nasal spray Place 1 spray into both nostrils daily as needed for allergies.     [provider]  furosemide (LASIX) 20 MG tablet Take 20-40 mg by mouth 2 (two) times a day. 01/30/19   [provider]  gabapentin (NEURONTIN) 100 MG capsule Take 200 mg by mouth at bedtime.  09/29/15   [provider]  ipratropium-albuterol (DUONEB) 0.5-2.5 (3) MG/3ML SOLN Take 3 mLs by nebulization every 6 (six) hours as needed. DX:J44.9 Patient not taking: Reported on 02/08/2019 10/28/17   Flora Lipps, MD  levothyroxine (SYNTHROID, LEVOTHROID) 88 MCG tablet Take 88 mcg by mouth daily before breakfast.    [provider]  Multiple Vitamins-Minerals (PRESERVISION AREDS 2 PO) Take 2 tablets by mouth daily.    [provider]  pantoprazole (PROTONIX) 40 MG tablet Take 40 mg by mouth 2 (two) times daily.     [provider]  sacubitril-valsartan (ENTRESTO) 24-26 MG Take 1 tablet by mouth 2 (two) times daily. 10/27/18   Deboraha Sprang, MD  simvastatin (ZOCOR) 10 MG tablet Take 1 tablet (10 mg total) by mouth at bedtime. 08/03/18 02/22/19  Rise Mu, PA-C  spironolactone (ALDACTONE) 25 MG tablet Take 0.5 tablets (12.5 mg total) by mouth daily. 09/17/18 02/08/19  Rise Mu, PA-C  warfarin (COUMADIN) 2.5 MG tablet TAKE AS DIRECTED BY COUMADIN CLINIC Patient taking differently: Take 1.25-2.5 mg by mouth daily at 6 PM. Take one tablet (2.5 mg) by mouth on Tuesday, Thursday, Saturday and Sunday. Take one-half tablet (1.25 mg) on Monday, Wednesday and Friday 02/02/19   Deboraha Sprang, MD  warfarin (COUMADIN) 3 MG tablet Take 1 tablet (3 mg total) by mouth one time only at 6 PM. Patient not taking: Reported on 02/08/2019 07/22/18   Loletha Grayer, MD    Physical Exam: Vitals:   02/22/19 1500    BP: (!) 129/45  Resp: 20  Temp: 97.6 F (36.4 C)  TempSrc: Oral    Constitutional: NAD, calm, comfortable Eyes: PERRL, lids and conjunctivae normal ENMT: Mucous membranes are moist.  Neck: normal, supple Respiratory: No wheezing, bibasilar crackles present.  Normal respiratory effort without accessory muscle use Cardiovascular: Regular rate and rhythm, 3/6 SEM.  Trace lower extremity edema. 2+ pedal pulses.  Abdomen: no tenderness, no masses palpated. Bowel sounds positive.  Musculoskeletal: no clubbing / cyanosis. Normal muscle tone.  Skin: no rashes, lesions, ulcers. No induration Neurologic: CN 2-12 grossly intact. Strength 5/5 in all 4.  Psychiatric: Normal judgment and insight. Alert and oriented x 3. Normal mood.   Labs on Admission: I have personally reviewed following labs and imaging studies  Chest x-ray cardiomegaly, increased pulmonary vascularity  CBC: Recent Labs  Lab 02/22/19 0950  WBC 5.5  NEUTROABS 4.6  HGB 9.2*  HCT 29.6*  MCV 103.1*  PLT 671*   Basic Metabolic Panel: Recent Labs  Lab 02/22/19 0950  NA 139  K 3.3*  CL 96*  CO2 32  GLUCOSE 127*  BUN 24*  CREATININE 0.77  CALCIUM 8.2*   GFR: Estimated Creatinine Clearance: 40.9 mL/min (by C-G formula based on SCr of 0.77 mg/dL). Liver Function Tests: Recent Labs  Lab  02/22/19 0950  AST 40  ALT 60*  ALKPHOS 73  BILITOT 0.6  PROT 6.6  ALBUMIN 3.5   No results for input(s): LIPASE, AMYLASE in the last 168 hours. No results for input(s): AMMONIA in the last 168 hours. Coagulation Profile: Recent Labs  Lab 02/22/19 0950  INR 3.5*   Cardiac Enzymes: Recent Labs  Lab 02/22/19 0950  TROPONINI <0.03   BNP (last 3 results) No results for input(s): PROBNP in the last 8760 hours. HbA1C: No results for input(s): HGBA1C in the last 72 hours. CBG: No results for input(s): GLUCAP in the last 168 hours. Lipid Profile: No results for input(s): CHOL, HDL, LDLCALC, TRIG, CHOLHDL, LDLDIRECT  in the last 72 hours. Thyroid Function Tests: No results for input(s): TSH, T4TOTAL, FREET4, T3FREE, THYROIDAB in the last 72 hours. Anemia Panel: No results for input(s): VITAMINB12, FOLATE, FERRITIN, TIBC, IRON, RETICCTPCT in the last 72 hours. Urine analysis:    Component Value Date/Time   COLORURINE YELLOW 11/13/2015 0654   APPEARANCEUR CLEAR 11/13/2015 0654   LABSPEC 1.020 11/13/2015 0654   PHURINE 5.0 11/13/2015 0654   GLUCOSEU NEGATIVE 11/13/2015 0654   HGBUR SMALL (A) 11/13/2015 0654   BILIRUBINUR NEGATIVE 11/13/2015 0654   KETONESUR NEGATIVE 11/13/2015 0654   PROTEINUR NEGATIVE 11/13/2015 0654   UROBILINOGEN 0.2 04/21/2015 1244   NITRITE NEGATIVE 11/13/2015 0654   LEUKOCYTESUR NEGATIVE 11/13/2015 0654     Radiological Exams on Admission: Dg Chest Portable 1 View  Result Date: 02/22/2019 CLINICAL DATA:  Cough and shortness of breath with chest congestion. Swelling the lower extremities. EXAM: PORTABLE CHEST 1 VIEW COMPARISON:  01/25/2019 FINDINGS: Chronic cardiomegaly. Multiple valve replacements. Atrial clip place. Pulmonary venous hypertension, without frank edema. No measurable effusion. Chronic calcified granuloma right mid chest chronic basilar lung markings. IMPRESSION: Chronic cardiomegaly. Pulmonary venous hypertension. No frank edema visible pleural effusion. Electronically Signed   By: Nelson Chimes M.D.   On: 02/22/2019 10:14    EKG: Independently reviewed. Paced rhythm   Assessment/Plan Principal Problem:   COVID-19 virus infection Active Problems:   Essential hypertension   Hyperlipidemia   Coronary artery disease   S/P mitral valve replacement with bioprosthetic valve, tricuspid valve repair, maze procedure and CABG x1   S/P Maze operation for atrial fibrillation   S/P CABG x 1   S/P placement of cardiac pacemaker 05/02/15, [employer]tronic   Type 2 diabetes mellitus (HCC)   Adult hypothyroidism   Benign essential HTN   Atrial fibrillation (HCC)   Acute on  chronic respiratory failure with hypoxia (HCC)   Acute on chronic systolic heart failure (HCC)   Principal Problem Acute on chronic hypoxic respiratory failure due to acute on chronic systolic CHF and pulmonary edema -Patient will be placed on scheduled IV diuresis, strictly monitor INO's, daily weights -Hold Entresto as she is getting actively diuresed and will monitor blood pressure -Provide oxygen for goal O2 sats above 88%  Active Problems Positive SARS-CoV-2 -Monitor inflammatory markers, currently without significant infiltrates on the chest x-ray to suggest active lung involvement.  For now closely monitor  Coronary artery disease status post CABG -No chest pain, troponin negative, EKG paced.  Hypokalemia -Due to Lasix, repleted, continue to monitor  Permanent A. fib status post maze procedure, currently pacemaker in place -Continue Coumadin per pharmacy, continue to monitor on telemetry -Continue amiodarone  Status post bioprosthetic mitral valve replacement, tricuspid valve repair  Hypothyroidism -Continue Synthroid  Hyperlipidemia -Continue statin  COPD -Currently no significant wheezing however did have intermittent wheezing  at home, will give steroids for a few days, continue albuterol inhaler   Type 2 diabetes mellitus -Place patient on sliding scale insulin, continue gabapentin -Most recent A1c in November 2019 was 6.0.  Chronic anemia -Multifactorial, iron deficiency and anemia of chronic medical disease, continue iron supplements   DVT prophylaxis: Coumadin per pharmacy Code Status: DNR  Family Communication: d/w patient Disposition Plan: Admit to [facility] Consults called: None   Marzetta Board, MD, PhD [facility]  Contact via [email or web address]  [facility] Office Info P: [phone]  F: [phone]   02/22/2019, 11:56 AM

## 2019-02-22 NOTE — ED Notes (Signed)
Esign not working at this time. Pt agreed to transfer to [facility]. Pt has no questions at this time. Husband updated on restrictions.

## 2019-02-22 NOTE — Progress Notes (Signed)
ANTICOAGULATION CONSULT NOTE - Initial Consult  Pharmacy Consult for Warfarin Indication: atrial fibrillation  Allergies  Allergen Reactions  . Ace Inhibitors Cough and Other (See Comments)  . Amoxicillin-Pot Clavulanate Swelling and Other (See Comments)    JOINTS, PAIN Joint pain   . Penicillins Swelling and Other (See Comments)    SWOLLEN JOINTS  Has patient had a PCN reaction causing immediate rash, facial/tongue/throat swelling, SOB or lightheadedness with hypotension: Yes Has patient had a PCN reaction causing severe rash involving mucus membranes or skin necrosis: No Has patient had a PCN reaction that required hospitalization No Has patient had a PCN reaction occurring within the last 10 years: No If all of the above answers are "NO", then may proceed with Cephalosporin use.  Swollen joints  . Nifedipine Other (See Comments)    UNSPECIFIED UNSPECIFIED   . Propranolol Other (See Comments)    UNSPECIFIED UNSPECIFIED   . Diazepam Other (See Comments)    Made her "hyper" Made her "hyper" Made her "hyper"   . Meperidine Other (See Comments)    Made her "hyper" Made her "hyper" Made her "hyper"   . Propoxyphene Other (See Comments)    Made her "hyper" Made her "hyper" Made her "hyper"     Patient Measurements:    Vital Signs: Temp: 97.6 F (36.4 C) (06/15 1500) Temp Source: Oral (06/15 1500) BP: 129/45 (06/15 1500) Pulse Rate: 65 (06/15 1330)  Labs: Recent Labs    02/22/19 0950  HGB 9.2*  HCT 29.6*  PLT 123*  LABPROT 34.9*  INR 3.5*  CREATININE 0.77  TROPONINI <0.03    Estimated Creatinine Clearance: 40.9 mL/min (by C-G formula based on SCr of 0.77 mg/dL).   Medical History: Past Medical History:  Diagnosis Date  . Anxiety   . Arthritis    "hands" (11/10/2015)  . CAD s/p CABG x 1    a. 04/2015 LIMA to LAD  . Chronic combined systolic (congestive) and diastolic (congestive) heart failure ([place])    a. 01/2015 Echo: EF 50-55%, Gr2 DD (preop  valve surgery); b. 08/2015 Echo: EF 30-35%, diff HK. Gr2 DD; c. 09/2016 Echo: EF 50-55%. Nl fxning TV ring. Mildly dil LA.  Marland Kitchen COPD (chronic obstructive pulmonary disease) ([place])   . Coronary artery disease   . GERD (gastroesophageal reflux disease)   . History of colon polyps   . History of mitral valve replacement    a. 04/2015 s/p 27 mm [facility] Mitral bovine bioprosthetic tissue valve  . Hyperlipidemia   . Hypertension   . Hypothyroidism   . 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 04/2015 Complete bilateral atrial lesion set using cryothermy and bipolar radiofrequency ablation with clipping of LA appendage  . Meniere disease   . Meniere's disease   . On home oxygen therapy    "2L at night" (11/10/2015)  . PAF (paroxysmal atrial fibrillation) ([place])   . Pneumonia ~ 2010  . PONV (postoperative nausea and vomiting)    Pt felt like eardrums were gonna pop  . Post-surgical complete heart block, symptomatic    a. 04/2015 s/p MDT [id number] Advisa DR MRI DC PPM.  . Presence of permanent cardiac pacemaker   . Pulmonary hypertension ([place])   . Surgical wound, non healing - chest wall 11/10/2015  . Tricuspid Regurgitation s/p Repair    a. 04/2015 s/p 26 mm Edwards mc3 ring annuloplasty  . Wound infection 10/13/2015   Superficial sternal wound infection    Medications:  Scheduled:  .  amiodarone  200 mg Oral Daily  . aspirin  81 mg Oral Daily  . bisoprolol  5 mg Oral Daily  . [START ON 02/23/2019] ferrous sulfate  325 mg Oral Q breakfast  . furosemide  40 mg Intravenous Q12H  . gabapentin  200 mg Oral QHS  . insulin aspart  0-5 Units Subcutaneous QHS  . insulin aspart  0-9 Units Subcutaneous TID WC  . [START ON 02/23/2019] levothyroxine  88 mcg Oral QAC breakfast  . methylPREDNISolone (SOLU-MEDROL) injection  30 mg Intravenous Q12H  . pantoprazole  40 mg Oral BID  . potassium chloride  40 mEq Oral Once  . simvastatin  10 mg Oral QHS   Infusions:    Assessment: [name] admitted on 6/15  with CHF, and COVID-19+.  PMH includes chronic warfarin anticoagulation for afib (s/p Maze preocedure, bioprosthetic mitral valve replacement, tricuspid valve repair), and chronic anemia.  Pharmacy is consulted to continue warfarin dosing inpatient.  PTA warfarin 2.5 mg daily except 1.25 mg on Mon, Wed, Fridays.  LD 6/14. Admission INR 3.5  Today, 02/22/2019: CBC:  Hgb 9.2 (baseline 11's), and Plt 123 (baseline 200's) No bleeding or complications reported.  Goal of Therapy:  INR 2-3 Monitor platelets by anticoagulation protocol: Yes   Plan:  Hold warfarin today. Daily PT/INR. Monitor for signs and symptoms of bleeding.   Gretta Arab PharmD, BCPS Clinical Pharmacist Clinical pharmacist phone 7am- 5pm: [phone] 02/22/2019 4:17 PM

## 2019-02-22 NOTE — Telephone Encounter (Signed)
Incoming call from Mr. Whitelaw. He reports that his wife has body aches, loss of appetite. cough and worsening SOB x 1 week.   No weight gain. No fevers.   Per husband, "she is in bad shape". "I am worried about her having the Covid". "She needs medical attention".   Pt referred to the ED for further medical evaluation. Husband agreed.   Encouraged to call us with any update, questions or concerns.

## 2019-02-23 ENCOUNTER — Encounter (HOSPITAL_COMMUNITY): Payer: Self-pay

## 2019-02-23 DIAGNOSIS — J9601 Acute respiratory failure with hypoxia: Secondary | ICD-10-CM

## 2019-02-23 LAB — CBC WITH DIFFERENTIAL/PLATELET
Abs Immature Granulocytes: 0 10*3/uL (ref 0.00–0.07)
Basophils Absolute: 0 10*3/uL (ref 0.0–0.1)
Basophils Relative: 0 %
Eosinophils Absolute: 0 10*3/uL (ref 0.0–0.5)
Eosinophils Relative: 0 %
HCT: 28.3 % — ABNORMAL LOW (ref 36.0–46.0)
Hemoglobin: 9 g/dL — ABNORMAL LOW (ref 12.0–15.0)
Immature Granulocytes: 0 %
Lymphocytes Relative: 11 %
Lymphs Abs: 0.3 10*3/uL — ABNORMAL LOW (ref 0.7–4.0)
MCH: 32.6 pg (ref 26.0–34.0)
MCHC: 31.8 g/dL (ref 30.0–36.0)
MCV: 102.5 fL — ABNORMAL HIGH (ref 80.0–100.0)
Monocytes Absolute: 0.1 10*3/uL (ref 0.1–1.0)
Monocytes Relative: 4 %
Neutro Abs: 2 10*3/uL (ref 1.7–7.7)
Neutrophils Relative %: 85 %
Platelets: 129 10*3/uL — ABNORMAL LOW (ref 150–400)
RBC: 2.76 MIL/uL — ABNORMAL LOW (ref 3.87–5.11)
RDW: 15.5 % (ref 11.5–15.5)
WBC: 2.3 10*3/uL — ABNORMAL LOW (ref 4.0–10.5)
nRBC: 0 % (ref 0.0–0.2)

## 2019-02-23 LAB — COMPREHENSIVE METABOLIC PANEL
ALT: 52 U/L — ABNORMAL HIGH (ref 0–44)
AST: 30 U/L (ref 15–41)
Albumin: 3.2 g/dL — ABNORMAL LOW (ref 3.5–5.0)
Alkaline Phosphatase: 64 U/L (ref 38–126)
Anion gap: 11 (ref 5–15)
BUN: 26 mg/dL — ABNORMAL HIGH (ref 8–23)
CO2: 30 mmol/L (ref 22–32)
Calcium: 8.6 mg/dL — ABNORMAL LOW (ref 8.9–10.3)
Chloride: 96 mmol/L — ABNORMAL LOW (ref 98–111)
Creatinine, Ser: 0.67 mg/dL (ref 0.44–1.00)
GFR calc Af Amer: >60 mL/min (ref >60)
GFR calc non Af Amer: >60 mL/min (ref >60)
Glucose, Bld: 125 mg/dL — ABNORMAL HIGH (ref 70–99)
Potassium: 3.9 mmol/L (ref 3.5–5.1)
Sodium: 137 mmol/L (ref 135–145)
Total Bilirubin: 0.6 mg/dL (ref 0.3–1.2)
Total Protein: 6.3 g/dL — ABNORMAL LOW (ref 6.5–8.1)

## 2019-02-23 LAB — GLUCOSE, CAPILLARY
Glucose-Capillary: 153 mg/dL — ABNORMAL HIGH (ref 70–99)
Glucose-Capillary: 206 mg/dL — ABNORMAL HIGH (ref 70–99)
Glucose-Capillary: 183 mg/dL — ABNORMAL HIGH (ref 70–99)
Glucose-Capillary: 154 mg/dL — ABNORMAL HIGH (ref 70–99)

## 2019-02-23 LAB — FERRITIN: Ferritin: 151 ng/mL (ref 11–307)

## 2019-02-23 LAB — PROTIME-INR
INR: 3.6 — ABNORMAL HIGH (ref 0.8–1.2)
Prothrombin Time: 35.2 seconds — ABNORMAL HIGH (ref 11.4–15.2)

## 2019-02-23 LAB — MAGNESIUM: Magnesium: 2.5 mg/dL — ABNORMAL HIGH (ref 1.7–2.4)

## 2019-02-23 LAB — PHOSPHORUS: Phosphorus: 3.2 mg/dL (ref 2.5–4.6)

## 2019-02-23 LAB — C-REACTIVE PROTEIN: CRP: 8.7 mg/dL — ABNORMAL HIGH (ref <1.0)

## 2019-02-23 LAB — D-DIMER, QUANTITATIVE: D-Dimer, Quant: 0.42 ug/mL-FEU (ref 0.00–0.50)

## 2019-02-23 LAB — ABO/RH: ABO/RH(D): O POS

## 2019-02-23 MED ORDER — ENSURE ENLIVE PO LIQD
237.0000 mL | Freq: Two times a day (BID) | ORAL | Status: DC
  Administered 2019-02-23 – 2019-02-26 (×6): 237 mL via ORAL

## 2019-02-23 MED ORDER — ADULT MULTIVITAMIN W/MINERALS CH
1.0000 | ORAL_TABLET | Freq: Every day | ORAL | Status: DC
  Administered 2019-02-23 – 2019-02-26 (×4): 1 via ORAL
  Filled 2019-02-23 (×4): qty 1

## 2019-02-23 MED ORDER — LIP MEDEX EX OINT
1.0000 "application " | TOPICAL_OINTMENT | CUTANEOUS | Status: DC | PRN
  Filled 2019-02-23: qty 7

## 2019-02-23 NOTE — Evaluation (Addendum)
Physical Therapy Evaluation Patient Details Name: [name] MRN: [id number] DOB: 1934/11/18 Today's Date: 02/23/2019   History of Present Illness  84 year old female with COPD with chronic hypoxia of 2 L [place] at home, chronic systolic CHF with EF of 01-02%, pulmonary hypertension, hyperlipidemia, A. fib currently with pacemaker on chronic anticoagulation, diabetes mellitus, CAD, who was admitted to the hospital on 6/16 due to progressive shortness of breath in the setting of fluid buildup.  Incidentally she was found to be covid-19 positive. had cardiac cath 02/08/19  Clinical Impression  The patient is very motivated to mobilize. Ptient's saturation on RA down to 88%. Patient ambulated x 80' on 2 L [place] requiring a rest break standing  Due to increased SOB. Sats remained > 90%. Encouraged patient to try not to talk while recovering. Pt admitted with above diagnosis. Pt currently with functional limitations due to the deficits listed below (see PT Problem List).  Pt will benefit from skilled PT to increase their independence and safety with mobility to allow discharge to the venue listed below.       Follow Up Recommendations Home health PT;Supervision for mobility/OOB    Equipment Recommendations  None recommended by PT    Recommendations for Other Services OT consult     Precautions / Restrictions Precautions Precautions: Fall Precaution Comments: monitor VS, cochlear implant, PPM      Mobility  Bed Mobility               General bed mobility comments: oob  Transfers Overall transfer level: Needs assistance Equipment used: Rolling walker (2 wheeled) Transfers: Sit to/from Stand Sit to Stand: Min guard         General transfer comment: cues for safety  Ambulation/Gait Ambulation/Gait assistance: Min assist Gait Distance (Feet): 80 Feet Assistive device: Rolling walker (2 wheeled) Gait Pattern/deviations: Step-through pattern     General Gait Details: increased  Dyspnea 4/4, stopped at 13' for break standing. Sats 89%, HR jumped to 131 briefly, otherwise in 60's  Stairs            Wheelchair Mobility    Modified Rankin (Stroke Patients Only)       Balance Overall balance assessment: Needs assistance Sitting-balance support: No upper extremity supported;Feet supported Sitting balance-Leahy Scale: Good     Standing balance support: During functional activity;Bilateral upper extremity supported Standing balance-Leahy Scale: Fair                               Pertinent Vitals/Pain Pain Assessment: No/denies pain    Home Living Family/patient expects to be discharged to:: Private residence Living Arrangements: Spouse/significant other Available Help at Discharge: Family Type of Home: House Home Access: Stairs to enter Entrance Stairs-Rails: Right Entrance Stairs-Number of Steps: 4 Home Layout: One level Home Equipment: Environmental consultant - 2 wheels;Cane - single point Additional Comments: chart relates Home O2 HS but pt. indicated that she did not have Oxygen-will clarify    Prior Function Level of Independence: Independent         Comments: does cooking, light housework     Hand Dominance        Extremity/Trunk Assessment   Upper Extremity Assessment Upper Extremity Assessment: Defer to OT evaluation    Lower Extremity Assessment Lower Extremity Assessment: Generalized weakness    Cervical / Trunk Assessment Cervical / Trunk Assessment: Kyphotic  Communication   Communication: HOH;Other (comment)(has cochlear implant)  Cognition Arousal/Alertness: Awake/alert Behavior During Therapy:  WFL for tasks assessed/performed Overall Cognitive Status: Within Functional Limits for tasks assessed                                        General Comments      Exercises     Assessment/Plan    PT Assessment Patient needs continued PT services  PT Problem List Decreased activity  tolerance;Decreased mobility;Cardiopulmonary status limiting activity;Decreased safety awareness       PT Treatment Interventions DME instruction;Gait training;Functional mobility training;Stair training;Therapeutic activities;Therapeutic exercise;Patient/family education    PT Goals (Current goals can be found in the Care Plan section)  Acute Rehab PT Goals Patient Stated Goal: to go home PT Goal Formulation: With patient Time For Goal Achievement: 03/09/19 Potential to Achieve Goals: Good    Frequency Min 3X/week   Barriers to discharge        Co-evaluation               AM-PAC PT "6 Clicks" Mobility  Outcome Measure Help needed turning from your back to your side while in a flat bed without using bedrails?: A Little Help needed moving from lying on your back to sitting on the side of a flat bed without using bedrails?: A Little Help needed moving to and from a bed to a chair (including a wheelchair)?: A Little Help needed standing up from a chair using your arms (e.g., wheelchair or bedside chair)?: A Little Help needed to walk in hospital room?: A Little Help needed climbing 3-5 steps with a railing? : A Lot 6 Click Score: 17    End of Session Equipment Utilized During Treatment: Gait belt;Oxygen Activity Tolerance: Patient tolerated treatment well Patient left: in chair;with call bell/phone within reach Nurse Communication: Mobility status PT Visit Diagnosis: Unsteadiness on feet (R26.81)    Time:  - 5409-8119     Charges:  mod eval 2 gait            [place] Pager [phone] Office [phone]   [name] 02/23/2019, 1:54 PM

## 2019-02-23 NOTE — Progress Notes (Signed)
PROGRESS NOTE  [name] KWI:[id number] DOB: November 02, 1934 DOA: 02/22/2019 PCP: Ezequiel Kayser, MD   LOS: 1 day   Brief Narrative / Interim history: 84 year old female with COPD with chronic hypoxia of 2 L [place] at home, chronic systolic CHF with EF of 24-26%, pulmonary hypertension, hyperlipidemia, A. fib currently with pacemaker on chronic anticoagulation with Coumadin, diabetes mellitus, CAD, who was admitted to the hospital on 6/16 due to progressive shortness of breath in the setting of fluid buildup.  Incidentally she was found to be covered positive.  Chest x-ray did not show any infiltrates specific for Covid  Subjective: Feeling a little bit better this morning, feels like her breathing is better.  She tells me that she urinated "a lot" overnight.  Denies any chest pain, denies any abdominal pain, no nausea or vomiting  Assessment & Plan: Principal Problem:   COVID-19 virus infection Active Problems:   Essential hypertension   Hyperlipidemia   Coronary artery disease   S/P mitral valve replacement with bioprosthetic valve, tricuspid valve repair, maze procedure and CABG x1   S/P Maze operation for atrial fibrillation   S/P CABG x 1   S/P placement of cardiac pacemaker 05/02/15, medtronic   Type 2 diabetes mellitus ([place])   Adult hypothyroidism   Benign essential HTN   Atrial fibrillation (HCC)   Acute on chronic respiratory failure with hypoxia (HCC)   Acute on chronic systolic heart failure (HCC)   Respiratory failure (HCC)   Principal Problem Principal Problem Acute on chronic hypoxic respiratory failure due to acute on chronic systolic CHF and pulmonary edema -She was placed on scheduled IV diuresis, she is net negative almost a liter since yesterday however weights do not reflect that.  Clinically she is improved, continue IV Lasix as renal function is stable -Hold Entresto as she is getting actively diuresed, monitor blood pressure -Anticipate she will need diuresis for  1-2 more days -Provide oxygen for goal O2 sats above 88%  Active Problems Positive SARS-CoV-2 -Monitor inflammatory markers, currently without significant infiltrates on the chest x-ray to suggest active lung involvement. -For now monitor, no evidence of active lung involvement  Coronary artery disease status post CABG -No chest pain, troponin negative, EKG paced.  Hypokalemia -Continue to monitor potassium and replete as needed  Permanent A. fib status post maze procedure, currently pacemaker in place -Continue amiodarone, Coumadin per pharmacy, paced rhythm on the monitor  Status post bioprosthetic mitral valve replacement, tricuspid valve repair  Hypothyroidism -Continue Synthroid  Hyperlipidemia -Continue statin  COPD -Patient started on steroids, continue, continue albuterol inhaler as needed.  She had wheezing prior to admission  Type 2 diabetes mellitus -Place patient on sliding scale insulin, continue gabapentin -Most recent A1c in November 2019 was 6.0. -Fasting CBG 153 this morning, continue current regimen  Chronic anemia -Multifactorial, iron deficiency and anemia of chronic medical disease, continue iron supplements  COVID-19 Labs  Recent Labs    02/22/19 1100 02/22/19 1623 02/23/19 0416  DDIMER  --  0.41 0.42  FERRITIN 173  --  151  LDH 193*  --   --   CRP 8.1* 8.9* 8.7*    Scheduled Meds: . amiodarone  200 mg Oral Daily  . aspirin  81 mg Oral Daily  . bisoprolol  5 mg Oral Daily  . feeding supplement (ENSURE ENLIVE)  237 mL Oral BID BM  . ferrous sulfate  325 mg Oral Daily  . furosemide  40 mg Intravenous Q12H  . gabapentin  200 mg  Oral QHS  . insulin aspart  0-5 Units Subcutaneous QHS  . insulin aspart  0-9 Units Subcutaneous TID WC  . levothyroxine  88 mcg Oral QAC breakfast  . methylPREDNISolone (SOLU-MEDROL) injection  30 mg Intravenous Q12H  . multivitamin with minerals  1 tablet Oral Daily  . pantoprazole  40 mg Oral BID  .  simvastatin  10 mg Oral QHS  . Warfarin - Pharmacist Dosing Inpatient   Does not apply q1800   Continuous Infusions: PRN Meds:.albuterol, ondansetron **OR** ondansetron (ZOFRAN) IV, senna-docusate  DVT prophylaxis: On Coumadin Code Status: DNR Family Communication: Discussed with patient Disposition Plan: Home likely 1 to 2 days  Consultants:   None  Procedures:   None   Antimicrobials:  None    Objective: Vitals:   02/23/19 0420 02/23/19 0753 02/23/19 0800 02/23/19 0900  BP: 123/61     Pulse: 61  (!) 59 62  Resp: 19  (!) 21 (!) 23  Temp: (!) 97.5 F (36.4 C) 97.7 F (36.5 C)    TempSrc: Oral Oral    SpO2: 99%  98% 97%  Weight: 55.3 kg     Height:        Intake/Output Summary (Last 24 hours) at 02/23/2019 1153 Last data filed at 02/23/2019 0849 Gross per 24 hour  Intake 940 ml  Output 1600 ml  Net -660 ml   Filed Weights   02/22/19 1548 02/23/19 0420  Weight: 54 kg 55.3 kg    Examination:  Constitutional: NAD, hard of hearing Eyes: PERRL, lids and conjunctivae normal ENMT: Mucous membranes are moist. Neck: normal, supple, no masses, no thyromegaly Respiratory: Diminished at the bases, no wheezing, no crackles. Normal respiratory effort. No accessory muscle use.  Cardiovascular: Regular rate and rhythm, no murmurs / rubs / gallops.  Trace LE edema. 2+ pedal pulses.  Abdomen: no tenderness. Bowel sounds positive.  Musculoskeletal: no clubbing / cyanosis. Skin: no rashes Neurologic: CN 2-12 grossly intact. Strength 5/5 in all 4.  Psychiatric: Normal judgment and insight. Alert and oriented x 3. Normal mood.    Data Reviewed: I have independently reviewed following labs and imaging studies   CBC: Recent Labs  Lab 02/22/19 0950 02/23/19 0416  WBC 5.5 2.3*  NEUTROABS 4.6 2.0  HGB 9.2* 9.0*  HCT 29.6* 28.3*  MCV 103.1* 102.5*  PLT 123* 397*   Basic Metabolic Panel: Recent Labs  Lab 02/22/19 0950 02/23/19 0416  NA 139 137  K 3.3* 3.9  CL 96*  96*  CO2 32 30  GLUCOSE 127* 125*  BUN 24* 26*  CREATININE 0.77 0.67  CALCIUM 8.2* 8.6*  MG  --  2.5*  PHOS  --  3.2   GFR: Estimated Creatinine Clearance: 40.9 mL/min (by C-G formula based on SCr of 0.67 mg/dL). Liver Function Tests: Recent Labs  Lab 02/22/19 0950 02/23/19 0416  AST 40 30  ALT 60* 52*  ALKPHOS 73 64  BILITOT 0.6 0.6  PROT 6.6 6.3*  ALBUMIN 3.5 3.2*   No results for input(s): LIPASE, AMYLASE in the last 168 hours. No results for input(s): AMMONIA in the last 168 hours. Coagulation Profile: Recent Labs  Lab 02/22/19 0950 02/23/19 0416  INR 3.5* 3.6*   Cardiac Enzymes: Recent Labs  Lab 02/22/19 0950  TROPONINI <0.03   BNP (last 3 results) No results for input(s): PROBNP in the last 8760 hours. HbA1C: No results for input(s): HGBA1C in the last 72 hours. CBG: Recent Labs  Lab 02/22/19 1623 02/22/19 2040 02/23/19 0751  02/23/19 1139  GLUCAP 119* 230* 153* 206*   Lipid Profile: Recent Labs    02/22/19 1100  TRIG 48   Thyroid Function Tests: No results for input(s): TSH, T4TOTAL, FREET4, T3FREE, THYROIDAB in the last 72 hours. Anemia Panel: Recent Labs    02/22/19 1100 02/23/19 0416  FERRITIN 173 151   Urine analysis:    Component Value Date/Time   COLORURINE YELLOW 11/13/2015 0654   APPEARANCEUR CLEAR 11/13/2015 0654   LABSPEC 1.020 11/13/2015 0654   PHURINE 5.0 11/13/2015 0654   GLUCOSEU NEGATIVE 11/13/2015 0654   HGBUR SMALL (A) 11/13/2015 0654   BILIRUBINUR NEGATIVE 11/13/2015 0654   KETONESUR NEGATIVE 11/13/2015 0654   PROTEINUR NEGATIVE 11/13/2015 0654   UROBILINOGEN 0.2 04/21/2015 1244   NITRITE NEGATIVE 11/13/2015 0654   LEUKOCYTESUR NEGATIVE 11/13/2015 0654   Sepsis Labs: Invalid input(s): PROCALCITONIN, LACTICIDVEN  Recent Results (from the past 240 hour(s))  SARS Coronavirus 2 (CEPHEID- Performed in [place] hospital lab), Hosp Order     Status: Abnormal   Collection Time: 02/22/19  9:50 AM   Specimen:  Nasopharyngeal Swab  Result Value Ref Range Status   SARS Coronavirus 2 POSITIVE (A) NEGATIVE Final    Comment: RESULT CALLED TO, READ BACK BY AND VERIFIED WITH: ALICIA GRAINGER @1055  ON 02/22/2019 BY FMW (NOTE) If result is NEGATIVE SARS-CoV-2 target nucleic acids are NOT DETECTED. The SARS-CoV-2 RNA is generally detectable in upper and lower  respiratory specimens during the acute phase of infection. The lowest  concentration of SARS-CoV-2 viral copies this assay can detect is 250  copies / mL. A negative result does not preclude SARS-CoV-2 infection  and should not be used as the sole basis for treatment or other  patient management decisions.  A negative result may occur with  improper specimen collection / handling, submission of specimen other  than nasopharyngeal swab, presence of viral mutation(s) within the  areas targeted by this assay, and inadequate number of viral copies  (<250 copies / mL). A negative result must be combined with clinical  observations, patient history, and epidemiological information. If result is POSITIVE SARS-CoV-2 target nucleic acids are DETEC TED. The SARS-CoV-2 RNA is generally detectable in upper and lower  respiratory specimens during the acute phase of infection.  Positive  results are indicative of active infection with SARS-CoV-2.  Clinical  correlation with patient history and other diagnostic information is  necessary to determine patient infection status.  Positive results do  not rule out bacterial infection or co-infection with other viruses. If result is PRESUMPTIVE POSTIVE SARS-CoV-2 nucleic acids MAY BE PRESENT.   A presumptive positive result was obtained on the submitted specimen  and confirmed on repeat testing.  While 2019 novel coronavirus  (SARS-CoV-2) nucleic acids may be present in the submitted sample  additional confirmatory testing may be necessary for epidemiological  and / or clinical management purposes  to  differentiate between  SARS-CoV-2 and other Sarbecovirus currently known to infect humans.  If clinically indicated additional testing with an alternate test  methodology (LAB7 [id number]) is advised. The SARS-CoV-2 RNA is generally  detectable in upper and lower respiratory specimens during the acute  phase of infection. The expected result is Negative. Fact Sheet for Patients:  StrictlyIdeas.no Fact Sheet for Healthcare Providers: BankingDealers.co.za This test is not yet approved or cleared by the [place] FDA and has been authorized for detection and/or diagnosis of SARS-CoV-2 by FDA under an Emergency Use Authorization (EUA).  This EUA will remain in effect (meaning  this test can be used) for the duration of the COVID-19 declaration under Section 564(b)(1) of the Act, 21 U.S.C. section 360bbb-3(b)(1), unless the authorization is terminated or revoked sooner. Performed at [facility], [address] [place], [place] [address]   Blood Culture (routine x 2)     Status: None (Preliminary result)   Collection Time: 02/22/19 11:38 AM   Specimen: BLOOD  Result Value Ref Range Status   Specimen Description BLOOD LEFT ANTECUBITAL  Final   Special Requests   Final    BOTTLES DRAWN AEROBIC AND ANAEROBIC Blood Culture adequate volume   Culture   Final    NO GROWTH < 24 HOURS Performed at [facility], [address], [place], [place] [address]    Report Status PENDING  Incomplete  Blood Culture (routine x 2)     Status: None (Preliminary result)   Collection Time: 02/22/19 11:39 AM   Specimen: BLOOD  Result Value Ref Range Status   Specimen Description BLOOD RIGHT ANTECUBITAL  Final   Special Requests   Final    BOTTLES DRAWN AEROBIC AND ANAEROBIC Blood Culture adequate volume   Culture   Final    NO GROWTH < 24 HOURS Performed at [facility], [address], [place], [place] [address]    Report Status  PENDING  Incomplete      Radiology Studies: Dg Chest Portable 1 View  Result Date: 02/22/2019 CLINICAL DATA:  Cough and shortness of breath with chest congestion. Swelling the lower extremities. EXAM: PORTABLE CHEST 1 VIEW COMPARISON:  01/25/2019 FINDINGS: Chronic cardiomegaly. Multiple valve replacements. Atrial clip place. Pulmonary venous hypertension, without frank edema. No measurable effusion. Chronic calcified granuloma right mid chest chronic basilar lung markings. IMPRESSION: Chronic cardiomegaly. Pulmonary venous hypertension. No frank edema visible pleural effusion. Electronically Signed   By: Nelson Chimes M.D.   On: 02/22/2019 10:14    Marzetta Board, MD, PhD [facility]  Contact via  [email or web address]  [place] P: [phone]  F: [phone]

## 2019-02-23 NOTE — Progress Notes (Signed)
Initial Nutrition Assessment   RD working remotely.   DOCUMENTATION CODES:   Not applicable  INTERVENTION:   Recommend liberalizing diet by removing 2g sodium diet restriction and/or 1200 mL fluid restriction to promote po intake  Pt receiving Hormel Shake daily with Breakfast which provides 520 kcals and 22 g of protein and Magic cup BID with lunch and dinner, each supplement provides 290 kcal and 9 grams of protein, automatically on meal trays to optimize nutritional intake.   Ensure Enlive po BID between meals, each supplement provides 350 kcal and 20 grams of protein  Add MVI with Minerals   NUTRITION DIAGNOSIS:   Inadequate oral intake related to acute illness, poor appetite as evidenced by per patient/family report, meal completion < 50%.  GOAL:   Patient will meet greater than or equal to 90% of their needs  MONITOR:   PO intake, Supplement acceptance, Labs, Weight trends  REASON FOR ASSESSMENT:   Malnutrition Screening Tool    ASSESSMENT:   84 yo female admitted with acute on chronic respiratory failure due to acute on chronic CHF and pulmonary edema, COVID-19 positive. PMH includes CAD s/p CABG, CHF, COPD, GERD, HLD, HTN, +smoker   Recorded po intake 30% at breakfast this AM. Pt reports loss of appetite;  +chronic nausea, no vomiting.   Per weight encounters, weight appears stable over the past month; noted possible weight loss over 2 kg from Feb to May of this year but difficult to say give pt hx of CHF. Unsure of dry weight  Labs: Creatinine wdl, CBGs 119-230 Meds: lasix, ferrous sulfate, ss novolog, solumedrol   NUTRITION - FOCUSED PHYSICAL EXAM:  Unable to assess  Diet Order:   Diet Order            Diet 2 gram sodium Room service appropriate? Yes; Fluid consistency: Thin; Fluid restriction: 1200 mL Fluid  Diet effective now              EDUCATION NEEDS:   Not appropriate for education at this time  Skin:  Skin Assessment: Reviewed RN  Assessment  Last BM:  6/15  Height:   Ht Readings from Last 1 Encounters:  02/22/19 5\' 1"  (1.549 m)    Weight:   Wt Readings from Last 1 Encounters:  02/23/19 55.3 kg    BMI:  Body mass index is 23.05 kg/m.  Estimated Nutritional Needs:   Kcal:  1400-1600 kcals  Protein:  70-80 g  Fluid:  1.4-1.6 L   [employer] MS, RDN, LDN, CNSC [phone] Pager  [phone] Weekend/On-Call Pager

## 2019-02-23 NOTE — Progress Notes (Signed)
ANTICOAGULATION CONSULT NOTE - Initial Consult  Pharmacy Consult for Warfarin Indication: atrial fibrillation  Allergies  Allergen Reactions  . Ace Inhibitors Cough and Other (See Comments)  . Amoxicillin-Pot Clavulanate Swelling and Other (See Comments)    JOINTS, PAIN Joint pain   . Penicillins Swelling and Other (See Comments)    SWOLLEN JOINTS  Has patient had a PCN reaction causing immediate rash, facial/tongue/throat swelling, SOB or lightheadedness with hypotension: Yes Has patient had a PCN reaction causing severe rash involving mucus membranes or skin necrosis: No Has patient had a PCN reaction that required hospitalization No Has patient had a PCN reaction occurring within the last 10 years: No If all of the above answers are "NO", then may proceed with Cephalosporin use.  Swollen joints  . Nifedipine Other (See Comments)    UNSPECIFIED UNSPECIFIED   . Propranolol Other (See Comments)    UNSPECIFIED UNSPECIFIED   . Diazepam Other (See Comments)    Made her "hyper"   . Meperidine Other (See Comments)    Made her "hyper"    . Propoxyphene Other (See Comments)    Made her "hyper"      Patient Measurements: Height: 5\' 1"  (154.9 cm) Weight: 122 lb (55.3 kg) IBW/kg (Calculated) : 47.8  Vital Signs: Temp: 97.7 F (36.5 C) (06/16 0753) Temp Source: Oral (06/16 0753) BP: 123/61 (06/16 0420) Pulse Rate: 61 (06/16 0420)  Labs: Recent Labs    02/22/19 0950 02/23/19 0416  HGB 9.2* 9.0*  HCT 29.6* 28.3*  PLT 123* 129*  LABPROT 34.9* 35.2*  INR 3.5* 3.6*  CREATININE 0.77 0.67  TROPONINI <0.03  --     Estimated Creatinine Clearance: 40.9 mL/min (by C-G formula based on SCr of 0.67 mg/dL).   Medical History: Past Medical History:  Diagnosis Date  . Anxiety   . Arthritis    "hands" (11/10/2015)  . CAD s/p CABG x 1    a. 04/2015 LIMA to LAD  . Chronic combined systolic (congestive) and diastolic (congestive) heart failure ([place])    a. 01/2015 Echo: EF  50-55%, Gr2 DD (preop valve surgery); b. 08/2015 Echo: EF 30-35%, diff HK. Gr2 DD; c. 09/2016 Echo: EF 50-55%. Nl fxning TV ring. Mildly dil LA.  Marland Kitchen COPD (chronic obstructive pulmonary disease) ([place])   . Coronary artery disease   . GERD (gastroesophageal reflux disease)   . History of colon polyps   . History of mitral valve replacement    a. 04/2015 s/p 27 mm [facility] Mitral bovine bioprosthetic tissue valve  . Hyperlipidemia   . Hypertension   . Hypothyroidism   . 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 04/2015 Complete bilateral atrial lesion set using cryothermy and bipolar radiofrequency ablation with clipping of LA appendage  . Meniere disease   . Meniere's disease   . On home oxygen therapy    "2L at night" (11/10/2015)  . PAF (paroxysmal atrial fibrillation) ([place])   . Pneumonia ~ 2010  . PONV (postoperative nausea and vomiting)    Pt felt like eardrums were gonna pop  . Post-surgical complete heart block, symptomatic    a. 04/2015 s/p MDT [id number] Advisa DR MRI DC PPM.  . Presence of permanent cardiac pacemaker   . Pulmonary hypertension ([place])   . Surgical wound, non healing - chest wall 11/10/2015  . Tricuspid Regurgitation s/p Repair    a. 04/2015 s/p 26 mm Edwards mc3 ring annuloplasty  . Wound infection 10/13/2015   Superficial sternal wound  infection    Medications:  Scheduled:  . amiodarone  200 mg Oral Daily  . aspirin  81 mg Oral Daily  . bisoprolol  5 mg Oral Daily  . ferrous sulfate  325 mg Oral Daily  . furosemide  40 mg Intravenous Q12H  . gabapentin  200 mg Oral QHS  . insulin aspart  0-5 Units Subcutaneous QHS  . insulin aspart  0-9 Units Subcutaneous TID WC  . levothyroxine  88 mcg Oral QAC breakfast  . methylPREDNISolone (SOLU-MEDROL) injection  30 mg Intravenous Q12H  . pantoprazole  40 mg Oral BID  . simvastatin  10 mg Oral QHS  . Warfarin - Pharmacist Dosing Inpatient   Does not apply q1800   Infusions:    Assessment: 77 yoF admitted on 6/15  with CHF, and COVID-19+.  PMH includes chronic warfarin anticoagulation for afib (s/p Maze preocedure, bioprosthetic mitral valve replacement, tricuspid valve repair), and chronic anemia.  Pharmacy is consulted to continue warfarin dosing inpatient.  PTA warfarin 2.5 mg daily except 1.25 mg on Mon, Wed, Fridays.  LD 6/14. Admission INR 3.5. INR is still elevated today at 3.6 so we will continue to hold coumadin. Pt is on an interacting med amiodarone.    Goal of Therapy:  INR 2-3 Monitor platelets by anticoagulation protocol: Yes   Plan:   No coumadin today Daily INR  Onnie Boer, PharmD, BCIDP, AAHIVP, CPP Infectious Disease Pharmacist 02/23/2019 8:33 AM

## 2019-02-24 ENCOUNTER — Other Ambulatory Visit: Payer: Medicare Other

## 2019-02-24 LAB — COMPREHENSIVE METABOLIC PANEL
ALT: 60 U/L — ABNORMAL HIGH (ref 0–44)
AST: 38 U/L (ref 15–41)
Albumin: 3.4 g/dL — ABNORMAL LOW (ref 3.5–5.0)
Alkaline Phosphatase: 77 U/L (ref 38–126)
Anion gap: 13 (ref 5–15)
BUN: 34 mg/dL — ABNORMAL HIGH (ref 8–23)
CO2: 31 mmol/L (ref 22–32)
Calcium: 9.4 mg/dL (ref 8.9–10.3)
Chloride: 98 mmol/L (ref 98–111)
Creatinine, Ser: 0.91 mg/dL (ref 0.44–1.00)
GFR calc Af Amer: >60 mL/min (ref >60)
GFR calc non Af Amer: 59 mL/min — ABNORMAL LOW (ref >60)
Glucose, Bld: 181 mg/dL — ABNORMAL HIGH (ref 70–99)
Potassium: 4.4 mmol/L (ref 3.5–5.1)
Sodium: 142 mmol/L (ref 135–145)
Total Bilirubin: 0.7 mg/dL (ref 0.3–1.2)
Total Protein: 7.1 g/dL (ref 6.5–8.1)

## 2019-02-24 LAB — CBC WITH DIFFERENTIAL/PLATELET
Abs Immature Granulocytes: 0.07 10*3/uL (ref 0.00–0.07)
Basophils Absolute: 0 10*3/uL (ref 0.0–0.1)
Basophils Relative: 0 %
Eosinophils Absolute: 0 10*3/uL (ref 0.0–0.5)
Eosinophils Relative: 0 %
HCT: 32.8 % — ABNORMAL LOW (ref 36.0–46.0)
Hemoglobin: 9.8 g/dL — ABNORMAL LOW (ref 12.0–15.0)
Immature Granulocytes: 0 %
Lymphocytes Relative: 3 %
Lymphs Abs: 0.4 10*3/uL — ABNORMAL LOW (ref 0.7–4.0)
MCH: 31.1 pg (ref 26.0–34.0)
MCHC: 29.9 g/dL — ABNORMAL LOW (ref 30.0–36.0)
MCV: 104.1 fL — ABNORMAL HIGH (ref 80.0–100.0)
Monocytes Absolute: 0.8 10*3/uL (ref 0.1–1.0)
Monocytes Relative: 5 %
Neutro Abs: 15.9 10*3/uL — ABNORMAL HIGH (ref 1.7–7.7)
Neutrophils Relative %: 92 %
Platelets: 239 10*3/uL (ref 150–400)
RBC: 3.15 MIL/uL — ABNORMAL LOW (ref 3.87–5.11)
RDW: 15.7 % — ABNORMAL HIGH (ref 11.5–15.5)
WBC: 17.2 10*3/uL — ABNORMAL HIGH (ref 4.0–10.5)
nRBC: 0 % (ref 0.0–0.2)

## 2019-02-24 LAB — D-DIMER, QUANTITATIVE: D-Dimer, Quant: 0.45 ug/mL-FEU (ref 0.00–0.50)

## 2019-02-24 LAB — GLUCOSE, CAPILLARY
Glucose-Capillary: 144 mg/dL — ABNORMAL HIGH (ref 70–99)
Glucose-Capillary: 232 mg/dL — ABNORMAL HIGH (ref 70–99)
Glucose-Capillary: 120 mg/dL — ABNORMAL HIGH (ref 70–99)
Glucose-Capillary: 157 mg/dL — ABNORMAL HIGH (ref 70–99)

## 2019-02-24 LAB — MAGNESIUM: Magnesium: 2.4 mg/dL (ref 1.7–2.4)

## 2019-02-24 LAB — PROTIME-INR
INR: 3.3 — ABNORMAL HIGH (ref 0.8–1.2)
Prothrombin Time: 32.8 seconds — ABNORMAL HIGH (ref 11.4–15.2)

## 2019-02-24 LAB — C-REACTIVE PROTEIN: CRP: 4.8 mg/dL — ABNORMAL HIGH (ref <1.0)

## 2019-02-24 LAB — PHOSPHORUS: Phosphorus: 3.8 mg/dL (ref 2.5–4.6)

## 2019-02-24 LAB — FERRITIN: Ferritin: 214 ng/mL (ref 11–307)

## 2019-02-24 MED ORDER — GUAIFENESIN-DM 100-10 MG/5ML PO SYRP
5.0000 mL | ORAL_SOLUTION | ORAL | Status: DC | PRN
  Administered 2019-02-24: 5 mL via ORAL
  Filled 2019-02-24: qty 10

## 2019-02-24 MED ORDER — HYDROCOD POLST-CPM POLST ER 10-8 MG/5ML PO SUER
5.0000 mL | Freq: Two times a day (BID) | ORAL | Status: DC | PRN
  Administered 2019-02-24 – 2019-02-25 (×4): 5 mL via ORAL
  Filled 2019-02-24 (×3): qty 5

## 2019-02-24 MED ORDER — GUAIFENESIN-CODEINE 100-10 MG/5ML PO SOLN: 5.0000 mL | Freq: Four times a day (QID) | ORAL | Status: DC | PRN

## 2019-02-24 MED ORDER — DEXAMETHASONE SODIUM PHOSPHATE 4 MG/ML IJ SOLN
6.0000 mg | INTRAMUSCULAR | Status: DC
  Administered 2019-02-24 – 2019-02-26 (×3): 6 mg via INTRAVENOUS
  Filled 2019-02-24 (×3): qty 2

## 2019-02-24 MED ORDER — ALUM & MAG HYDROXIDE-SIMETH 200-200-20 MG/5ML PO SUSP
30.0000 mL | ORAL | Status: DC | PRN
  Administered 2019-02-24: 30 mL via ORAL
  Filled 2019-02-24: qty 30

## 2019-02-24 MED ORDER — HYDRALAZINE HCL 10 MG PO TABS
10.0000 mg | ORAL_TABLET | Freq: Three times a day (TID) | ORAL | Status: DC
  Administered 2019-02-24 – 2019-02-25 (×3): 10 mg via ORAL
  Filled 2019-02-24 (×4): qty 1

## 2019-02-24 NOTE — Evaluation (Signed)
Occupational Therapy Evaluation Patient Details Name: [name] MRN: [id number] DOB: 07/16/1935 Today's Date: 02/24/2019    History of Present Illness 84 year old female with COPD with chronic hypoxia of 2 L [place] at home, chronic systolic CHF with EF of 99-37%, pulmonary hypertension, hyperlipidemia, A. fib currently with pacemaker on chronic anticoagulation, diabetes mellitus, CAD, who was admitted to the hospital on 6/16 due to progressive shortness of breath in the setting of fluid buildup.  Incidentally she was found to be covid-19 positive. had cardiac cath 02/08/19   Clinical Impression   PTA Pt was independent in ADL and mobility without DME. Today Pt was eager to work with OT despite a sleepless night due to new productive cough. Pt was able to walk into bathroom on 4LO2 wih RW at min guard level. She was able to perform toilet transfer and peri care at min guard, and that includes managing mesh underwear. Pt then stood at sink to perform washing hands, washing face and oral care. Brief moments of HR increasing into the 130's but quickly went back into the 70's as well as one instance where O2 read at 73% but with bad wave form. With pursed lip breathing and standing rest break back up above 90%. Pt will benefit from skilled OT in the acute setting as well as afterwards at the [facility] level to maximize safety and independence in ADL and functional transfers as well as focus on IADL as Pt was cooking and cleaning PTA. Next session take HEP and level 1 theraband and energy conservation education as Pt is HIGHLY motivated.     Follow Up Recommendations  Home health OT;Supervision/Assistance - 24 hour    Equipment Recommendations  3 in 1 bedside commode    Recommendations for Other Services       Precautions / Restrictions Precautions Precautions: Fall Precaution Comments: monitor VS, cochlear implant, PPM Restrictions Weight Bearing Restrictions: No      Mobility Bed Mobility Overal  bed mobility: Needs Assistance Bed Mobility: Supine to Sit;Sit to Supine     Supine to sit: Supervision Sit to supine: Supervision   General bed mobility comments: supervision for safety and line mangement  Transfers Overall transfer level: Needs assistance Equipment used: Rolling walker (2 wheeled) Transfers: Sit to/from Stand Sit to Stand: Min guard         General transfer comment: cues for safety, moves quickly    Balance Overall balance assessment: Needs assistance Sitting-balance support: No upper extremity supported;Feet supported Sitting balance-Leahy Scale: Good     Standing balance support: No upper extremity supported;During functional activity Standing balance-Leahy Scale: Good Standing balance comment: standing at sink for functional grooming                           ADL either performed or assessed with clinical judgement   ADL Overall ADL's : Needs assistance/impaired Eating/Feeding: Independent   Grooming: Wash/dry hands;Wash/dry face;Oral care;Applying deodorant;Min guard;Standing Grooming Details (indicate cue type and reason): sink level, 2 times required cues for pursed lip breathing Upper Body Bathing: Min guard;Standing   Lower Body Bathing: Min guard;Sit to/from stand   Upper Body Dressing : Supervision/safety;Sitting   Lower Body Dressing: Min guard;Sitting/lateral leans Lower Body Dressing Details (indicate cue type and reason): able to don/doff underwear Toilet Transfer: Min guard;Ambulation;RW;Regular Toilet;Grab bars Toilet Transfer Details (indicate cue type and reason): in bathroom Toileting- Clothing Manipulation and Hygiene: Modified independent;Sit to/from stand Toileting - Water quality scientist Details (indicate cue  type and reason): managed mesh undies and peri care with warm wash cloth     Functional mobility during ADLs: Min guard;Rolling walker General ADL Comments: overall decreased activity tolerance, generalized  weakness     Vision Patient Visual Report: No change from baseline       Perception     Praxis      Pertinent Vitals/Pain Pain Assessment: No/denies pain     Hand Dominance Right   Extremity/Trunk Assessment Upper Extremity Assessment Upper Extremity Assessment: Generalized weakness   Lower Extremity Assessment Lower Extremity Assessment: Defer to PT evaluation   Cervical / Trunk Assessment Cervical / Trunk Assessment: Kyphotic   Communication Communication Communication: HOH(cochlear implant)   Cognition Arousal/Alertness: Awake/alert Behavior During Therapy: WFL for tasks assessed/performed Overall Cognitive Status: Within Functional Limits for tasks assessed                                     General Comments       Exercises     Shoulder Instructions      Home Living Family/patient expects to be discharged to:: Private residence Living Arrangements: Spouse/significant other Available Help at Discharge: Family;Available 24 hours/day Type of Home: House Home Access: Stairs to enter [employer] of Steps: 4 Entrance Stairs-Rails: Right Home Layout: One level     Bathroom Shower/Tub: Walk-in shower;Door   [employer] Toilet: Handicapped height Bathroom Accessibility: Yes How Accessible: Accessible via walker Home Equipment: [place] - 2 wheels;Cane - single point   Additional Comments: chart relates Home O2 HS but pt. indicated that she did not have Oxygen-will clarify      Prior Functioning/Environment Level of Independence: Independent        Comments: does cooking, light housework        OT Problem List: Decreased strength;Decreased activity tolerance;Impaired balance (sitting and/or standing);Decreased knowledge of use of DME or AE;Decreased knowledge of precautions;Cardiopulmonary status limiting activity      OT Treatment/Interventions: Self-care/ADL training;Therapeutic exercise;Energy conservation;DME and/or AE  instruction;Therapeutic activities;Patient/family education;Balance training    OT Goals(Current goals can be found in the care plan section) Acute Rehab OT Goals Patient Stated Goal: get back to Sunday morning catch-ups with her brothers OT Goal Formulation: With patient Time For Goal Achievement: 03/10/19 Potential to Achieve Goals: Good ADL Goals Pt Will Perform Grooming: with modified independence;standing Pt Will Perform Upper Body Dressing: with modified independence;sitting Pt Will Perform Lower Body Dressing: with modified independence;sit to/from stand Pt Will Transfer to Toilet: with modified independence;ambulating Pt Will Perform Toileting - Clothing Manipulation and hygiene: with modified independence;sit to/from stand Pt/caregiver will Perform Home Exercise Program: Both right and left upper extremity;With theraband;Increased strength;Independently;With written HEP provided Additional ADL Goal #1: Pt will recall 3 ways of conserving energy during ADL at independent level  OT Frequency: Min 2X/week   Barriers to D/C:            Co-evaluation              AM-PAC OT "6 Clicks" Daily Activity     Outcome Measure Help from another person eating meals?: None Help from another person taking care of personal grooming?: A Little Help from another person toileting, which includes using toliet, bedpan, or urinal?: A Little Help from another person bathing (including washing, rinsing, drying)?: A Little Help from another person to put on and taking off regular upper body clothing?: A Little Help from another person to  put on and taking off regular lower body clothing?: A Little 6 Click Score: 19   End of Session Equipment Utilized During Treatment: Gait belt;Rolling walker;Oxygen(4L) Nurse Communication: Mobility status  Activity Tolerance: Patient tolerated treatment well Patient left: in bed;with call bell/phone within reach  OT Visit Diagnosis: Unsteadiness on feet  (R26.81);Other abnormalities of gait and mobility (R26.89);Muscle weakness (generalized) (M62.81)                Time: 1595-3967 OT Time Calculation (min): 29 min Charges:  OT General Charges $OT Visit: 1 Visit OT Evaluation $OT Eval Moderate Complexity: 1 Mod OT Treatments $Self Care/Home Management : 8-22 mins  Hulda Humphrey OTR/L Acute Rehabilitation Services Pager: [phone] Office: [place] 02/24/2019, 2:46 PM

## 2019-02-24 NOTE — Progress Notes (Signed)
Pt has developed a congested productive cough that she did not have 24 hrs ago. Pt also reports indigestion/reflux developed during the night. Pt spent several hours in the recliner as she has been unable to sleep last nightt possiby from the effects of solumedrol as pt reports being more jittery.most of the night was on O2@2lnc  maintaining O2 sats .95% At this hour I increased back to 3lnc while pt was 92-94%

## 2019-02-24 NOTE — Progress Notes (Signed)
OK to change robitussin AC to Tussionex since it's in the pyxis per Dr. Loleta Books.  Onnie Boer, PharmD, BCIDP, AAHIVP, CPP Infectious Disease Pharmacist 02/24/2019 9:05 AM

## 2019-02-24 NOTE — Progress Notes (Signed)
ANTICOAGULATION CONSULT NOTE - Follow Up Consult  Pharmacy Consult for warfarin Indication: atrial fibrillation  Allergies  Allergen Reactions  . Ace Inhibitors Cough and Other (See Comments)  . Amoxicillin-Pot Clavulanate Swelling and Other (See Comments)    JOINTS, PAIN Joint pain   . Penicillins Swelling and Other (See Comments)    SWOLLEN JOINTS  Has patient had a PCN reaction causing immediate rash, facial/tongue/throat swelling, SOB or lightheadedness with hypotension: Yes Has patient had a PCN reaction causing severe rash involving mucus membranes or skin necrosis: No Has patient had a PCN reaction that required hospitalization No Has patient had a PCN reaction occurring within the last 10 years: No If all of the above answers are "NO", then may proceed with Cephalosporin use.  Swollen joints  . Nifedipine Other (See Comments)    UNSPECIFIED UNSPECIFIED   . Propranolol Other (See Comments)    UNSPECIFIED UNSPECIFIED   . Diazepam Other (See Comments)    Made her "hyper"   . Meperidine Other (See Comments)    Made her "hyper"    . Propoxyphene Other (See Comments)    Made her "hyper"      Patient Measurements: Height: 5\' 1"  (154.9 cm) Weight: 122 lb (55.3 kg) IBW/kg (Calculated) : 47.8  Vital Signs: Temp: 98.3 F (36.8 C) (06/17 0738) Temp Source: Oral (06/17 0738) BP: 130/67 (06/17 0738) Pulse Rate: 61 (06/17 0738)  Labs: Recent Labs    02/22/19 0950 02/23/19 0416 02/24/19 0506  HGB 9.2* 9.0* 9.8*  HCT 29.6* 28.3* 32.8*  PLT 123* 129* 239  LABPROT 34.9* 35.2* 32.8*  INR 3.5* 3.6* 3.3*  CREATININE 0.77 0.67 0.91  TROPONINI <0.03  --   --     Estimated Creatinine Clearance: 36 mL/min (by C-G formula based on SCr of 0.91 mg/dL).   Medications:  Scheduled:  . amiodarone  200 mg Oral Daily  . aspirin  81 mg Oral Daily  . bisoprolol  5 mg Oral Daily  . dexamethasone  6 mg Intravenous Q24H  . feeding supplement (ENSURE ENLIVE)  237 mL Oral  BID BM  . ferrous sulfate  325 mg Oral Daily  . furosemide  40 mg Intravenous Q12H  . gabapentin  200 mg Oral QHS  . insulin aspart  0-5 Units Subcutaneous QHS  . insulin aspart  0-9 Units Subcutaneous TID WC  . levothyroxine  88 mcg Oral QAC breakfast  . multivitamin with minerals  1 tablet Oral Daily  . pantoprazole  40 mg Oral BID  . simvastatin  10 mg Oral QHS  . Warfarin - Pharmacist Dosing Inpatient   Does not apply q1800   Infusions:    Assessment: 53 yoF admitted on 6/15 with CHF, and COVID-19+.  PMH includes chronic warfarin anticoagulation for afib (s/p Maze preocedure, bioprosthetic mitral valve replacement, tricuspid valve repair), and chronic anemia.  Pharmacy is consulted to continue warfarin dosing inpatient. PTA warfarin 2.5 mg daily except 1.25 mg on Mon, Wed, Fridays.  LD 6/14. Admission INR 3.5.   Today, 02/24/2019: INR is still elevated at 3.3 CBC: Hgb improved to 9.8 and Plt 239.  No bleeding or complications reported. Drug-drug interaction: amiodarone (chronic home med)   Goal of Therapy:  INR 2-3 Monitor platelets by anticoagulation protocol: Yes   Plan:  Continue to hold warfarin. Daily PT/INR. Monitor for signs and symptoms of bleeding.  Gretta Arab PharmD, BCPS Clinical Pharmacist Clinical pharmacist phone 7am- 5pm: [phone] 02/24/2019 12:37 PM

## 2019-02-24 NOTE — Progress Notes (Signed)
Patient transferred to 1W room 138 d/t air handler noise interfering with patient's cochlear implant.  Report given to Cristie Hem, RN.

## 2019-02-24 NOTE — Progress Notes (Addendum)
PROGRESS NOTE    [name]  XVQ:[id number] DOB: 1934/11/14 DOA: 02/22/2019 PCP: Ezequiel Kayser, MD      Brief Narrative:  [name] is a 84 y.o. F with COPD on 2L home O2, sCHF EF 35-40%, pHTN, Afib and pacer on Warfarin, CAD and DM who presented with 10 days progressive SOB on exertion, swelling.    In the ER, CoV+, CXR negative, on home O2.      Assessment & Plan:  Acute on chronic systolic CHF Documented net even yesterday, but unmeasured UOP x4 documented.  Creatinine stable.  K normal. Still SOB, orthopneic and increased O2 needs. -Furosemide 40 mg IV twice a day   -Strict I/Os, daily weights, telemetry  -Daily monitoring renal function -Hold Entresto, spironolactone  Addendum: Talked with Dr. Tyrell Antonio partner from Cardiology by phone.  They were not asked to directly evaluate patient due to COVID-19 pandemic.  They recommended afterload reduction with hydralazine (BP permits) and possibly pre-load reduction with nitrate if able.  Echo will be unlikely to add significantly to her management.  Device reprogramming will have to wait until COVID resolved.     Coronavirus pneumonitis without acute hypoxic respiratory failure In setting of ongoing 2020 COVID-19 pandemic.  O2 needs increased from baseline 2L but stable overnight.  Inflammatory markers trending down.  -Continue steroids with dexamethasone 6 mg daily given intolerance to Solu-medrol overnight.  -VTE PPx with warfarin -Start codeine -Daily d-dimer, ferritin and CRP  COVID-19 Labs Recent Labs    02/22/19 1100 02/22/19 1623 02/23/19 0416 02/24/19 0506  DDIMER  --  0.41 0.42 0.45  FERRITIN 173  --  151 214  LDH 193*  --   --   --   CRP 8.1* 8.9* 8.7* 4.8*    Atrial fibrillation, chronic History of mitral and tricuspid valve repair INR 3.3 today -Continue amiodarone -Hold warfarin today -Continue bisoprolol  Coronary disease secondary prevention -Continue aspirin -Continue simvastatin  Hypokalemia Resolved  Hypothyroidism -Continue levothyroxine  Diabetes -Continue sliding scale corrections  Anemia of chronic disease Hemoglobin stable -Continue iron  Other medications -Continue pantoprazole      MDM and disposition: The below labs and imaging reports were reviewed and summarized above.  Medication management as above.  The patient was admitted with a severe exacerbation of her chronic illness (CHF).         DVT prophylaxis: Lovenox Code Status: DNR Family Communication: Attempted to reach husband, no answer    Consultants:   None  Procedures:   None  Antimicrobials:   None   Culture data:   6/15 blood culture x2 NGTD       Subjective: Severe cough overnight.  No confusion, vomiting, no fever.  Orthopnea persists.    Objective: Vitals:   02/23/19 1955 02/23/19 2104 02/24/19 0352 02/24/19 0738  BP:   (!) 151/59 130/67  Pulse:   62 61  Resp:   (!) 21 (!) 22  Temp: 98.5 F (36.9 C)  97.7 F (36.5 C) 98.3 F (36.8 C)  TempSrc: Oral  Oral Oral  SpO2:  94% 95% 98%  Weight:      Height:        Intake/Output Summary (Last 24 hours) at 02/24/2019 0847 Last data filed at 02/24/2019 0750 Gross per 24 hour  Intake 500 ml  Output 800 ml  Net -300 ml   Filed Weights   02/22/19 1548 02/23/19 0420  Weight: 54 kg 55.3 kg    Examination: General appearance: thin  adult female, alert and in no acute distress.   HEENT: Anicteric, conjunctiva pink, lids and lashes normal. No nasal deformity, discharge, epistaxis.  Lips moist.   Skin: Warm and dry.  no jaundice.  No suspicious rashes or lesions. Cardiac: RRR, nl S1-S2, no murmurs appreciated.  Capillary refill is brisk.  JVP not visible.  No LE edema.  Radial pulses 2+ and symmetric. Respiratory: Tachynpeic, lungs diminished. Abdomen: Abdomen soft.  no TTP. No ascites, distension, hepatosplenomegaly.   MSK: No deformities or effusions. Neuro: Awake and alert.  EOMI, moves all  extremities. Speech fluent.    Psych: Sensorium intact and responding to questions, attention normal. Affect normal.  Judgment and insight appear normal.       Data Reviewed: I have personally reviewed following labs and imaging studies:  CBC: Recent Labs  Lab 02/22/19 0950 02/23/19 0416 02/24/19 0506  WBC 5.5 2.3* 17.2*  NEUTROABS 4.6 2.0 15.9*  HGB 9.2* 9.0* 9.8*  HCT 29.6* 28.3* 32.8*  MCV 103.1* 102.5* 104.1*  PLT 123* 129* 768   Basic Metabolic Panel: Recent Labs  Lab 02/22/19 0950 02/23/19 0416 02/24/19 0506  NA 139 137 142  K 3.3* 3.9 4.4  CL 96* 96* 98  CO2 32 30 31  GLUCOSE 127* 125* 181*  BUN 24* 26* 34*  CREATININE 0.77 0.67 0.91  CALCIUM 8.2* 8.6* 9.4  MG  --  2.5* 2.4  PHOS  --  3.2 3.8   GFR: Estimated Creatinine Clearance: 36 mL/min (by C-G formula based on SCr of 0.91 mg/dL). Liver Function Tests: Recent Labs  Lab 02/22/19 0950 02/23/19 0416 02/24/19 0506  AST 40 30 38  ALT 60* 52* 60*  ALKPHOS 73 64 77  BILITOT 0.6 0.6 0.7  PROT 6.6 6.3* 7.1  ALBUMIN 3.5 3.2* 3.4*   No results for input(s): LIPASE, AMYLASE in the last 168 hours. No results for input(s): AMMONIA in the last 168 hours. Coagulation Profile: Recent Labs  Lab 02/22/19 0950 02/23/19 0416 02/24/19 0506  INR 3.5* 3.6* 3.3*   Cardiac Enzymes: Recent Labs  Lab 02/22/19 0950  TROPONINI <0.03   BNP (last 3 results) No results for input(s): PROBNP in the last 8760 hours. HbA1C: No results for input(s): HGBA1C in the last 72 hours. CBG: Recent Labs  Lab 02/22/19 2040 02/23/19 0751 02/23/19 1139 02/23/19 1618 02/23/19 2055  GLUCAP 230* 153* 206* 183* 154*   Lipid Profile: Recent Labs    02/22/19 1100  TRIG 48   Thyroid Function Tests: No results for input(s): TSH, T4TOTAL, FREET4, T3FREE, THYROIDAB in the last 72 hours. Anemia Panel: Recent Labs    02/23/19 0416 02/24/19 0506  FERRITIN 151 214   Urine analysis:    Component Value Date/Time    COLORURINE YELLOW 11/13/2015 0654   APPEARANCEUR CLEAR 11/13/2015 0654   LABSPEC 1.020 11/13/2015 0654   PHURINE 5.0 11/13/2015 0654   GLUCOSEU NEGATIVE 11/13/2015 0654   HGBUR SMALL (A) 11/13/2015 0654   BILIRUBINUR NEGATIVE 11/13/2015 0654   KETONESUR NEGATIVE 11/13/2015 0654   PROTEINUR NEGATIVE 11/13/2015 0654   UROBILINOGEN 0.2 04/21/2015 1244   NITRITE NEGATIVE 11/13/2015 0654   LEUKOCYTESUR NEGATIVE 11/13/2015 0654   Sepsis Labs: @LABRCNTIP (procalcitonin:4,lacticacidven:4)  ) Recent Results (from the past 240 hour(s))  SARS Coronavirus 2 (CEPHEID- Performed in [place] hospital lab), Hosp Order     Status: Abnormal   Collection Time: 02/22/19  9:50 AM   Specimen: Nasopharyngeal Swab  Result Value Ref Range Status   SARS Coronavirus 2 POSITIVE (  A) NEGATIVE Final    Comment: RESULT CALLED TO, READ BACK BY AND VERIFIED WITH: ALICIA GRAINGER @1055  ON 02/22/2019 BY FMW (NOTE) If result is NEGATIVE SARS-CoV-2 target nucleic acids are NOT DETECTED. The SARS-CoV-2 RNA is generally detectable in upper and lower  respiratory specimens during the acute phase of infection. The lowest  concentration of SARS-CoV-2 viral copies this assay can detect is 250  copies / mL. A negative result does not preclude SARS-CoV-2 infection  and should not be used as the sole basis for treatment or other  patient management decisions.  A negative result may occur with  improper specimen collection / handling, submission of specimen other  than nasopharyngeal swab, presence of viral mutation(s) within the  areas targeted by this assay, and inadequate number of viral copies  (<250 copies / mL). A negative result must be combined with clinical  observations, patient history, and epidemiological information. If result is POSITIVE SARS-CoV-2 target nucleic acids are DETEC TED. The SARS-CoV-2 RNA is generally detectable in upper and lower  respiratory specimens during the acute phase of infection.   Positive  results are indicative of active infection with SARS-CoV-2.  Clinical  correlation with patient history and other diagnostic information is  necessary to determine patient infection status.  Positive results do  not rule out bacterial infection or co-infection with other viruses. If result is PRESUMPTIVE POSTIVE SARS-CoV-2 nucleic acids MAY BE PRESENT.   A presumptive positive result was obtained on the submitted specimen  and confirmed on repeat testing.  While 2019 novel coronavirus  (SARS-CoV-2) nucleic acids may be present in the submitted sample  additional confirmatory testing may be necessary for epidemiological  and / or clinical management purposes  to differentiate between  SARS-CoV-2 and other Sarbecovirus currently known to infect humans.  If clinically indicated additional testing with an alternate test  methodology (LAB7 453) is advised. The SARS-CoV-2 RNA is generally  detectable in upper and lower respiratory specimens during the acute  phase of infection. The expected result is Negative. Fact Sheet for Patients:  StrictlyIdeas.no Fact Sheet for Healthcare Providers: BankingDealers.co.za This test is not yet approved or cleared by the [place] FDA and has been authorized for detection and/or diagnosis of SARS-CoV-2 by FDA under an Emergency Use Authorization (EUA).  This EUA will remain in effect (meaning this test can be used) for the duration of the COVID-19 declaration under Section 564(b)(1) of the Act, 21 U.S.C. section 360bbb-3(b)(1), unless the authorization is terminated or revoked sooner. Performed at [facility], [address] [place], [place] [address]   Blood Culture (routine x 2)     Status: None (Preliminary result)   Collection Time: 02/22/19 11:38 AM   Specimen: BLOOD  Result Value Ref Range Status   Specimen Description BLOOD LEFT ANTECUBITAL  Final   Special Requests    Final    BOTTLES DRAWN AEROBIC AND ANAEROBIC Blood Culture adequate volume   Culture   Final    NO GROWTH 2 DAYS Performed at [name], [address], [place], [place] [address]    Report Status PENDING  Incomplete  Blood Culture (routine x 2)     Status: None (Preliminary result)   Collection Time: 02/22/19 11:39 AM   Specimen: BLOOD  Result Value Ref Range Status   Specimen Description BLOOD RIGHT ANTECUBITAL  Final   Special Requests   Final    BOTTLES DRAWN AEROBIC AND ANAEROBIC Blood Culture adequate volume   Culture  Final    NO GROWTH 2 DAYS Performed at [facility], [address] [place], [place] [address]    Report Status PENDING  Incomplete         Radiology Studies: Dg Chest Portable 1 View  Result Date: 02/22/2019 CLINICAL DATA:  Cough and shortness of breath with chest congestion. Swelling the lower extremities. EXAM: PORTABLE CHEST 1 VIEW COMPARISON:  01/25/2019 FINDINGS: Chronic cardiomegaly. Multiple valve replacements. Atrial clip place. Pulmonary venous hypertension, without frank edema. No measurable effusion. Chronic calcified granuloma right mid chest chronic basilar lung markings. IMPRESSION: Chronic cardiomegaly. Pulmonary venous hypertension. No frank edema visible pleural effusion. Electronically Signed   By: Nelson Chimes M.D.   On: 02/22/2019 10:14        Scheduled Meds: . amiodarone  200 mg Oral Daily  . aspirin  81 mg Oral Daily  . bisoprolol  5 mg Oral Daily  . feeding supplement (ENSURE ENLIVE)  237 mL Oral BID BM  . ferrous sulfate  325 mg Oral Daily  . furosemide  40 mg Intravenous Q12H  . gabapentin  200 mg Oral QHS  . insulin aspart  0-5 Units Subcutaneous QHS  . insulin aspart  0-9 Units Subcutaneous TID WC  . levothyroxine  88 mcg Oral QAC breakfast  . methylPREDNISolone (SOLU-MEDROL) injection  30 mg Intravenous Q12H  . multivitamin with minerals  1 tablet Oral Daily  . pantoprazole  40 mg Oral BID  .  simvastatin  10 mg Oral QHS  . Warfarin - Pharmacist Dosing Inpatient   Does not apply q1800   Continuous Infusions:   LOS: 2 days    Time spent: 35 minutes     Edwin Dada, MD [facility] 02/24/2019, 8:47 AM     Please page through [place]:  [email or web address] Password [email or web address] If 7PM-7AM, please contact night-coverage

## 2019-02-25 LAB — C-REACTIVE PROTEIN: CRP: 11.1 mg/dL — ABNORMAL HIGH (ref <1.0)

## 2019-02-25 LAB — CBC WITH DIFFERENTIAL/PLATELET
Abs Immature Granulocytes: 0.03 10*3/uL (ref 0.00–0.07)
Basophils Absolute: 0 10*3/uL (ref 0.0–0.1)
Basophils Relative: 0 %
Eosinophils Absolute: 0.2 10*3/uL (ref 0.0–0.5)
Eosinophils Relative: 2 %
HCT: 28 % — ABNORMAL LOW (ref 36.0–46.0)
Hemoglobin: 8.7 g/dL — ABNORMAL LOW (ref 12.0–15.0)
Immature Granulocytes: 0 %
Lymphocytes Relative: 5 %
Lymphs Abs: 0.4 10*3/uL — ABNORMAL LOW (ref 0.7–4.0)
MCH: 32.6 pg (ref 26.0–34.0)
MCHC: 31.1 g/dL (ref 30.0–36.0)
MCV: 104.9 fL — ABNORMAL HIGH (ref 80.0–100.0)
Monocytes Absolute: 0.5 10*3/uL (ref 0.1–1.0)
Monocytes Relative: 6 %
Neutro Abs: 7.4 10*3/uL (ref 1.7–7.7)
Neutrophils Relative %: 87 %
Platelets: 185 10*3/uL (ref 150–400)
RBC: 2.67 MIL/uL — ABNORMAL LOW (ref 3.87–5.11)
RDW: 15.7 % — ABNORMAL HIGH (ref 11.5–15.5)
WBC: 8.5 10*3/uL (ref 4.0–10.5)
nRBC: 0 % (ref 0.0–0.2)

## 2019-02-25 LAB — COMPREHENSIVE METABOLIC PANEL
ALT: 43 U/L (ref 0–44)
AST: 25 U/L (ref 15–41)
Albumin: 3 g/dL — ABNORMAL LOW (ref 3.5–5.0)
Alkaline Phosphatase: 65 U/L (ref 38–126)
Anion gap: 13 (ref 5–15)
BUN: 30 mg/dL — ABNORMAL HIGH (ref 8–23)
CO2: 35 mmol/L — ABNORMAL HIGH (ref 22–32)
Calcium: 8.6 mg/dL — ABNORMAL LOW (ref 8.9–10.3)
Chloride: 95 mmol/L — ABNORMAL LOW (ref 98–111)
Creatinine, Ser: 0.77 mg/dL (ref 0.44–1.00)
GFR calc Af Amer: >60 mL/min (ref >60)
GFR calc non Af Amer: >60 mL/min (ref >60)
Glucose, Bld: 107 mg/dL — ABNORMAL HIGH (ref 70–99)
Potassium: 4.4 mmol/L (ref 3.5–5.1)
Sodium: 143 mmol/L (ref 135–145)
Total Bilirubin: 0.5 mg/dL (ref 0.3–1.2)
Total Protein: 6.1 g/dL — ABNORMAL LOW (ref 6.5–8.1)

## 2019-02-25 LAB — PHOSPHORUS: Phosphorus: 3 mg/dL (ref 2.5–4.6)

## 2019-02-25 LAB — FERRITIN: Ferritin: 265 ng/mL (ref 11–307)

## 2019-02-25 LAB — GLUCOSE, CAPILLARY
Glucose-Capillary: 116 mg/dL — ABNORMAL HIGH (ref 70–99)
Glucose-Capillary: 378 mg/dL — ABNORMAL HIGH (ref 70–99)
Glucose-Capillary: 280 mg/dL — ABNORMAL HIGH (ref 70–99)
Glucose-Capillary: 139 mg/dL — ABNORMAL HIGH (ref 70–99)

## 2019-02-25 LAB — MAGNESIUM: Magnesium: 2.4 mg/dL (ref 1.7–2.4)

## 2019-02-25 LAB — PROTIME-INR
INR: 2.7 — ABNORMAL HIGH (ref 0.8–1.2)
Prothrombin Time: 28.4 seconds — ABNORMAL HIGH (ref 11.4–15.2)

## 2019-02-25 LAB — D-DIMER, QUANTITATIVE: D-Dimer, Quant: 0.35 ug/mL-FEU (ref 0.00–0.50)

## 2019-02-25 MED ORDER — FUROSEMIDE 20 MG PO TABS: 20.0000 mg | ORAL_TABLET | Freq: Two times a day (BID) | ORAL | Status: DC

## 2019-02-25 MED ORDER — HYDRALAZINE HCL 10 MG PO TABS
5.0000 mg | ORAL_TABLET | Freq: Three times a day (TID) | ORAL | Status: DC
  Administered 2019-02-25 – 2019-02-26 (×3): 5 mg via ORAL
  Filled 2019-02-25 (×4): qty 1

## 2019-02-25 MED ORDER — FUROSEMIDE 20 MG PO TABS
20.0000 mg | ORAL_TABLET | Freq: Every day | ORAL | Status: DC
  Administered 2019-02-25: 20 mg via ORAL
  Filled 2019-02-25: qty 1

## 2019-02-25 MED ORDER — FUROSEMIDE 20 MG PO TABS
40.0000 mg | ORAL_TABLET | Freq: Every day | ORAL | Status: DC
  Administered 2019-02-26: 40 mg via ORAL
  Filled 2019-02-25: qty 2

## 2019-02-25 MED ORDER — WARFARIN SODIUM 1 MG PO TABS
1.0000 mg | ORAL_TABLET | Freq: Once | ORAL | Status: AC
  Administered 2019-02-25: 1 mg via ORAL
  Filled 2019-02-25: qty 1

## 2019-02-25 NOTE — Progress Notes (Signed)
ANTICOAGULATION CONSULT NOTE - Follow Up Consult  Pharmacy Consult for warfarin Indication: atrial fibrillation   Patient Measurements: Height: 5\' 1"  (154.9 cm) Weight: 121 lb (54.9 kg) IBW/kg (Calculated) : 47.8  Vital Signs: Temp: 97.9 F (36.6 C) (06/18 0735) Temp Source: Oral (06/18 0735) BP: 123/52 (06/18 0735) Pulse Rate: 65 (06/18 0725)  Labs: Recent Labs    02/22/19 0950 02/23/19 0416 02/24/19 0506 02/25/19 0400  HGB 9.2* 9.0* 9.8* 8.7*  HCT 29.6* 28.3* 32.8* 28.0*  PLT 123* 129* 239 185  LABPROT 34.9* 35.2* 32.8* 28.4*  INR 3.5* 3.6* 3.3* 2.7*  CREATININE 0.77 0.67 0.91 0.77  TROPONINI <0.03  --   --   --     Estimated Creatinine Clearance: 40.9 mL/min (by C-G formula based on SCr of 0.77 mg/dL).   Medications:  Scheduled:  . amiodarone  200 mg Oral Daily  . aspirin EC  81 mg Oral Daily  . bisoprolol  5 mg Oral Daily  . dexamethasone  6 mg Intravenous Q24H  . feeding supplement (ENSURE ENLIVE)  237 mL Oral BID BM  . ferrous sulfate  325 mg Oral Daily  . furosemide  40 mg Intravenous Q12H  . gabapentin  200 mg Oral QHS  . hydrALAZINE  10 mg Oral Q8H  . insulin aspart  0-5 Units Subcutaneous QHS  . insulin aspart  0-9 Units Subcutaneous TID WC  . levothyroxine  88 mcg Oral QAC breakfast  . multivitamin with minerals  1 tablet Oral Daily  . pantoprazole  40 mg Oral BID  . simvastatin  10 mg Oral QHS  . Warfarin - Pharmacist Dosing Inpatient   Does not apply q1800   Infusions:    Assessment: 19 yoF admitted on 6/15 with CHF, and COVID-19+.  PMH includes chronic warfarin anticoagulation for afib (s/p [place] preocedure, bioprosthetic mitral valve replacement, tricuspid valve repair), and chronic anemia.  Pharmacy is consulted to continue warfarin dosing inpatient. PTA warfarin 2.5 mg daily except 1.25 mg on Mon, Wed, Fridays.  LD 6/14. Admission INR 3.5.   Today, 02/25/2019: INR 2.7, decreased to therapeutic range after holding x3 days CBC: Hgb  decreased to 8.7, Plt down to 185.  No bleeding or complications reported. Drug-drug interaction: amiodarone (chronic home med)   Goal of Therapy:  INR 2-3 Monitor platelets by anticoagulation protocol: Yes   Plan:  Warfarin 1mg  PO today x1 at 18:00 Daily PT/INR. Monitor for signs and symptoms of bleeding.  Gretta Arab PharmD, BCPS Clinical Pharmacist Clinical pharmacist phone 7am- 5pm: [phone] 02/25/2019 8:10 AM

## 2019-02-25 NOTE — Progress Notes (Signed)
Physical Therapy Treatment Patient Details Name: [name] MRN: [id number] DOB: September 19, 1934 Today's Date: 02/25/2019    History of Present Illness 84 year old female with COPD with chronic hypoxia of 2 L [place] at home, chronic systolic CHF with EF of 07-37%, pulmonary hypertension, hyperlipidemia, A. fib currently with pacemaker on chronic anticoagulation, diabetes mellitus, CAD, who was admitted to the hospital on 6/16 due to progressive shortness of breath in the setting of fluid buildup.  Incidentally she was found to be covid-19 positive. had cardiac cath 02/08/19    PT Comments    The patient is progressing well. Patient SaO2 down to 85% on RA sitting on bed edge. Ambulated on 3 l with SaO2 89-93%. Continue PT for mobility and O2 needs.   Follow Up Recommendations  No PT follow up     Equipment Recommendations  None recommended by PT    Recommendations for Other Services       Precautions / Restrictions Precautions Precautions: Fall Precaution Comments: monitor VS, cochlear implant, PPM    Mobility  Bed Mobility Overal bed mobility: Independent                Transfers Overall transfer level: Needs assistance Equipment used: Rolling walker (2 wheeled) Transfers: Sit to/from Stand Sit to Stand: Min guard            Ambulation/Gait Ambulation/Gait assistance: Min guard;Min assist Gait Distance (Feet): 240 Feet Assistive device: Rolling walker (2 wheeled) Gait Pattern/deviations: Step-through pattern;Drifts right/left     General Gait Details: gait is steady with Rw, O2 sats >89 -93% on 3 L.   Stairs             Wheelchair Mobility    Modified Rankin (Stroke Patients Only)       Balance Overall balance assessment: Needs assistance   Sitting balance-Leahy Scale: Good     Standing balance support: No upper extremity supported;During functional activity Standing balance-Leahy Scale: Good                               Cognition Arousal/Alertness: Awake/alert                                            Exercises General Exercises - Upper Extremity Shoulder Flexion: Strengthening;Both;10 reps    General Comments        Pertinent Vitals/Pain Pain Assessment: No/denies pain    Home Living                      Prior Function            PT Goals (current goals can now be found in the care plan section)      Frequency    Min 3X/week      PT Plan Current plan remains appropriate    Co-evaluation              AM-PAC PT "6 Clicks" Mobility   Outcome Measure  Help needed turning from your back to your side while in a flat bed without using bedrails?: None Help needed moving from lying on your back to sitting on the side of a flat bed without using bedrails?: None Help needed moving to and from a bed to a chair (including a wheelchair)?: None Help needed standing up from a chair  using your arms (e.g., wheelchair or bedside chair)?: A Little Help needed to walk in hospital room?: A Little Help needed climbing 3-5 steps with a railing? : A Lot 6 Click Score: 20    End of Session Equipment Utilized During Treatment: Oxygen Activity Tolerance: Patient tolerated treatment well Patient left: in bed;with call bell/phone within reach Nurse Communication: Mobility status PT Visit Diagnosis: Unsteadiness on feet (R26.81)     Time: 0947-0962 PT Time Calculation (min) (ACUTE ONLY): 26 min  Charges:  $Gait Training: 23-37 mins                     [name] PT Acute Rehabilitation Services [phone] Office [phone]    [name] 02/25/2019, 4:02 PM

## 2019-02-25 NOTE — Progress Notes (Signed)
Occupational Therapy Treatment Patient Details Name: [name] MRN: [id number] DOB: February 20, 1935 Today's Date: 02/25/2019    History of present illness 84 year old female with COPD with chronic hypoxia of 2 L [place] at home, chronic systolic CHF with EF of 09-98%, pulmonary hypertension, hyperlipidemia, A. fib currently with pacemaker on chronic anticoagulation, diabetes mellitus, CAD, who was admitted to the hospital on 6/16 due to progressive shortness of breath in the setting of fluid buildup.  Incidentally she was found to be covid-19 positive. had cardiac cath 02/08/19   OT comments  Pt progressing towards OT goals this session. Pt was able to perform toilet transfer at min guard without DME, peri care and sink level grooming. Then sat EOB for energy conservation education and HEP with level 1 theraband (see full details below) Pt on 3LO2 throughout session and VSS (O2 between 89-93%). OT will continue to follow acutely. Pt excited about potential discharge tomorrow.    Follow Up Recommendations  Home health OT;Supervision/Assistance - 24 hour    Equipment Recommendations  3 in 1 bedside commode    Recommendations for Other Services      Precautions / Restrictions Precautions Precautions: Fall Precaution Comments: monitor VS, cochlear implant, PPM Restrictions Weight Bearing Restrictions: No       Mobility Bed Mobility Overal bed mobility: Independent                Transfers Overall transfer level: Needs assistance Equipment used: None Transfers: Sit to/from Stand Sit to Stand: Min guard              Balance Overall balance assessment: Needs assistance   Sitting balance-Leahy Scale: Good     Standing balance support: No upper extremity supported;During functional activity Standing balance-Leahy Scale: Fair                             ADL either performed or assessed with clinical judgement   ADL Overall ADL's : Needs assistance/impaired      Grooming: Wash/dry hands;Wash/dry face;Min guard;Standing Grooming Details (indicate cue type and reason): sink level                 Toilet Transfer: Min guard;Ambulation Toilet Transfer Details (indicate cue type and reason): no DME, back and forth to bathroom Toileting- Clothing Manipulation and Hygiene: Modified independent;Sit to/from stand Toileting - Clothing Manipulation Details (indicate cue type and reason): managed mesh undies and peri care with warm wash cloth     Functional mobility during ADLs: Min guard(no DME) General ADL Comments: focused also on energy conservation, handout provided and reviewed in full     Vision       Perception     Praxis      Cognition Arousal/Alertness: Awake/alert Behavior During Therapy: WFL for tasks assessed/performed Overall Cognitive Status: Within Functional Limits for tasks assessed                                          Exercises Exercises: General Upper Extremity General Exercises - Upper Extremity Shoulder Flexion: Strengthening;Both;5 reps;Seated;Theraband Theraband Level (Shoulder Flexion): Level 1 (Yellow) Shoulder ABduction: Both;5 reps;Strengthening;Seated;Theraband Theraband Level (Shoulder Abduction): Level 1 (Yellow) Shoulder Horizontal ABduction: Strengthening;Both;5 reps;Seated;Theraband Theraband Level (Shoulder Horizontal Abduction): Level 1 (Yellow) Elbow Flexion: Strengthening;Both;5 reps;Seated;Theraband Theraband Level (Elbow Flexion): Level 1 (Yellow) Chair Push Up: Both;5 reps;Seated   Shoulder Instructions  General Comments      Pertinent Vitals/ Pain       Pain Assessment: No/denies pain  Home Living                                          Prior Functioning/Environment              Frequency  Min 2X/week        Progress Toward Goals  OT Goals(current goals can now be found in the care plan section)  Progress towards OT goals:  Progressing toward goals  Acute Rehab OT Goals Patient Stated Goal: get back to Sunday morning catch-ups with her brothers OT Goal Formulation: With patient Time For Goal Achievement: 03/10/19 Potential to Achieve Goals: Good  Plan Discharge plan remains appropriate;Frequency remains appropriate    Co-evaluation                 AM-PAC OT "6 Clicks" Daily Activity     Outcome Measure   Help from another person eating meals?: None Help from another person taking care of personal grooming?: A Little Help from another person toileting, which includes using toliet, bedpan, or urinal?: A Little Help from another person bathing (including washing, rinsing, drying)?: A Little Help from another person to put on and taking off regular upper body clothing?: A Little Help from another person to put on and taking off regular lower body clothing?: A Little 6 Click Score: 19    End of Session Equipment Utilized During Treatment: Oxygen(3L)  OT Visit Diagnosis: Unsteadiness on feet (R26.81);Other abnormalities of gait and mobility (R26.89);Muscle weakness (generalized) (M62.81)   Activity Tolerance Patient tolerated treatment well   Patient Left in bed;with call bell/phone within reach(sitting EOB)   Nurse Communication Mobility status        Time: 6073-7106 OT Time Calculation (min): 28 min  Charges: OT General Charges $OT Visit: 1 Visit OT Treatments $Self Care/Home Management : 8-22 mins $Therapeutic Exercise: 8-22 mins  Hulda Humphrey OTR/L Acute Rehabilitation Services Pager: [phone] Office: [place] 02/25/2019, 5:15 PM

## 2019-02-25 NOTE — Progress Notes (Signed)
PROGRESS NOTE    [name]  DPO:[id number] DOB: 1935-07-04 DOA: 02/22/2019 PCP: Ezequiel Kayser, MD      Brief Narrative:  [name] is a 85 y.o. F with COPD on 2L home O2, sCHF EF 35-40%, pHTN, Afib and pacer on Warfarin, CAD and DM who presented with 10 days progressive SOB on exertion, swelling.    In the ER, CoV+, CXR negative, on home O2.      Assessment & Plan:  Acute on chronic systolic CHF Documented net even again, weight down 1 lb.   Talked with Dr. Tyrell Antonio partner from Cardiology by phone.  They were not asked to directly evaluate patient due to COVID-19 pandemic.  They recommended afterload reduction with hydralazine (BP permits) and possibly pre-load reduction with nitrate if able.  Echo will be unlikely to add significantly to her management.  Device reprogramming will have to wait until COVID resolved.  Cr no change, K normal, bicarb up. BP good on hydralazine.  No room for additional pre-load reduction with Imdur.  SOB improved, orthopnea returned to baseline.   -Transition to oral Lasix    -Strict I/Os, daily weights, telemetry  -Daily monitoring renal function -Hold Entresto, spironolactone       Coronavirus pneumonitis without acute hypoxic respiratory failure In setting of ongoing 2020 COVID-19 pandemic.  O2 needs no change, 3L.  CRP up.  Afebrile.    -Continue dexamethasone  -Continue VTE PPx with warfarin -Continue codeine -Daily d-dimer, ferritin and CRP  COVID-19 Labs Recent Labs    02/22/19 1100  02/23/19 0416 02/24/19 0506 02/25/19 0400  DDIMER  --    < > 0.42 0.45 0.35  FERRITIN 173  --  151 214 265  LDH 193*  --   --   --   --   CRP 8.1*   < > 8.7* 4.8* 11.1*   < > = values in this interval not displayed.    Atrial fibrillation, chronic History of mitral and tricuspid valve repair INR 2.7 today, therapeutic.  Rate controlled -Continue amiodarone -Continue warfarin -Continue bisoprolol  Coronary disease secondary  prevention -Continue aspirin, simvastatin  Hypokalemia Resolved  Hypothyroidism -Continue levothyroxine  Diabetes Glucoses well controlled -Continue sliding scale corrections  Anemia of chronic disease Hemoglobin stable, no clinical bleeding. -Continue iron  Other medications -Continue pantoprazole      MDM and disposition: The below labs and imaging reports were reviewed and summarized above.  Medication management as above.  The patient was admitted with a severe exacerbation of her chronic illness (CHF).         DVT prophylaxis: Lovenox Code Status: DNR Family Communication: Husband by phone    Consultants:   None  Procedures:   None  Antimicrobials:   None   Culture data:   6/15 blood culture x2 NGTD       Subjective: Orthopnea is improved, no leg swelling.  Dyspnea feels close to baseline.  No new fever, sputum production, confusion.  Cough is slightly better.  Objective: Vitals:   02/25/19 0533 02/25/19 0600 02/25/19 0725 02/25/19 0735  BP: 125/60  (!) 127/51 (!) 123/52  Pulse:  (!) 59 65   Resp:  14 (!) 22   Temp:  98.6 F (37 C)  97.9 F (36.6 C)  TempSrc:  Oral  Oral  SpO2:  96% 91%   Weight:      Height:        Intake/Output Summary (Last 24 hours) at 02/25/2019 0841 Last data  filed at 02/25/2019 0600 Gross per 24 hour  Intake 1320 ml  Output 950 ml  Net 370 ml   Filed Weights   02/22/19 1548 02/23/19 0420 02/25/19 0420  Weight: 54 kg 55.3 kg 54.9 kg    Examination: General appearance: Thin adult female, lying in bed, interactive, no acute distress.   HEENT: Anicteric, conjunctival pink, lids and lashes normal, no nasal discharge, deformity, or epistaxis.  Lips moist, dentition normal, oropharynx moist, no oral lesions, hearing severely diminished, cochlear implant in place.   Skin: Warm and dry, no suspicious rashes or lesions. Cardiac: Regular rate and rhythm, no murmurs appreciated, capillary refill brisk, JVP  normal, no lower extremity edema. Respiratory: Lung sounds diminished, normal respiratory effort, no rales at bases.  No wheezing. Abdomen: Abdomen soft, no tenderness to palpation, no ascites, distention, or hepatosplenomegaly MSK: No deformities or effusions. Neuro: Alert, extraocular movements intact, moves all extremities with normal strength and coordination, speech fluent. Psych: Sensorium intact and responding to questions, attention normal, affect normal, judgment and insight appear normal.       Data Reviewed: I have personally reviewed following labs and imaging studies:  CBC: Recent Labs  Lab 02/22/19 0950 02/23/19 0416 02/24/19 0506 02/25/19 0400  WBC 5.5 2.3* 17.2* 8.5  NEUTROABS 4.6 2.0 15.9* 7.4  HGB 9.2* 9.0* 9.8* 8.7*  HCT 29.6* 28.3* 32.8* 28.0*  MCV 103.1* 102.5* 104.1* 104.9*  PLT 123* 129* 239 470   Basic Metabolic Panel: Recent Labs  Lab 02/22/19 0950 02/23/19 0416 02/24/19 0506 02/25/19 0400  NA 139 137 142 143  K 3.3* 3.9 4.4 4.4  CL 96* 96* 98 95*  CO2 32 30 31 35*  GLUCOSE 127* 125* 181* 107*  BUN 24* 26* 34* 30*  CREATININE 0.77 0.67 0.91 0.77  CALCIUM 8.2* 8.6* 9.4 8.6*  MG  --  2.5* 2.4 2.4  PHOS  --  3.2 3.8 3.0   GFR: Estimated Creatinine Clearance: 40.9 mL/min (by C-G formula based on SCr of 0.77 mg/dL). Liver Function Tests: Recent Labs  Lab 02/22/19 0950 02/23/19 0416 02/24/19 0506 02/25/19 0400  AST 40 30 38 25  ALT 60* 52* 60* 43  ALKPHOS 73 64 77 65  BILITOT 0.6 0.6 0.7 0.5  PROT 6.6 6.3* 7.1 6.1*  ALBUMIN 3.5 3.2* 3.4* 3.0*   No results for input(s): LIPASE, AMYLASE in the last 168 hours. No results for input(s): AMMONIA in the last 168 hours. Coagulation Profile: Recent Labs  Lab 02/22/19 0950 02/23/19 0416 02/24/19 0506 02/25/19 0400  INR 3.5* 3.6* 3.3* 2.7*   Cardiac Enzymes: Recent Labs  Lab 02/22/19 0950  TROPONINI <0.03   BNP (last 3 results) No results for input(s): PROBNP in the last 8760  hours. HbA1C: No results for input(s): HGBA1C in the last 72 hours. CBG: Recent Labs  Lab 02/24/19 0744 02/24/19 1154 02/24/19 1720 02/24/19 2104 02/25/19 0738  GLUCAP 144* 232* 120* 157* 116*   Lipid Profile: Recent Labs    02/22/19 1100  TRIG 48   Thyroid Function Tests: No results for input(s): TSH, T4TOTAL, FREET4, T3FREE, THYROIDAB in the last 72 hours. Anemia Panel: Recent Labs    02/24/19 0506 02/25/19 0400  FERRITIN 214 265   Urine analysis:    Component Value Date/Time   COLORURINE YELLOW 11/13/2015 0654   APPEARANCEUR CLEAR 11/13/2015 0654   LABSPEC 1.020 11/13/2015 0654   PHURINE 5.0 11/13/2015 0654   GLUCOSEU NEGATIVE 11/13/2015 0654   HGBUR SMALL (A) 11/13/2015 0654   BILIRUBINUR  NEGATIVE 11/13/2015 0654   KETONESUR NEGATIVE 11/13/2015 0654   PROTEINUR NEGATIVE 11/13/2015 0654   UROBILINOGEN 0.2 04/21/2015 1244   NITRITE NEGATIVE 11/13/2015 0654   LEUKOCYTESUR NEGATIVE 11/13/2015 0654   Sepsis Labs: @LABRCNTIP (procalcitonin:4,lacticacidven:4)  ) Recent Results (from the past 240 hour(s))  SARS Coronavirus 2 (CEPHEID- Performed in [facility] hospital lab), Hosp Order     Status: Abnormal   Collection Time: 02/22/19  9:50 AM   Specimen: Nasopharyngeal Swab  Result Value Ref Range Status   SARS Coronavirus 2 POSITIVE (A) NEGATIVE Final    Comment: RESULT CALLED TO, READ BACK BY AND VERIFIED WITH: ALICIA GRAINGER @1055  ON 02/22/2019 BY FMW (NOTE) If result is NEGATIVE SARS-CoV-2 target nucleic acids are NOT DETECTED. The SARS-CoV-2 RNA is generally detectable in upper and lower  respiratory specimens during the acute phase of infection. The lowest  concentration of SARS-CoV-2 viral copies this assay can detect is 250  copies / mL. A negative result does not preclude SARS-CoV-2 infection  and should not be used as the sole basis for treatment or other  patient management decisions.  A negative result may occur with  improper specimen  collection / handling, submission of specimen other  than nasopharyngeal swab, presence of viral mutation(s) within the  areas targeted by this assay, and inadequate number of viral copies  (<250 copies / mL). A negative result must be combined with clinical  observations, patient history, and epidemiological information. If result is POSITIVE SARS-CoV-2 target nucleic acids are DETEC TED. The SARS-CoV-2 RNA is generally detectable in upper and lower  respiratory specimens during the acute phase of infection.  Positive  results are indicative of active infection with SARS-CoV-2.  Clinical  correlation with patient history and other diagnostic information is  necessary to determine patient infection status.  Positive results do  not rule out bacterial infection or co-infection with other viruses. If result is PRESUMPTIVE POSTIVE SARS-CoV-2 nucleic acids MAY BE PRESENT.   A presumptive positive result was obtained on the submitted specimen  and confirmed on repeat testing.  While 2019 novel coronavirus  (SARS-CoV-2) nucleic acids may be present in the submitted sample  additional confirmatory testing may be necessary for epidemiological  and / or clinical management purposes  to differentiate between  SARS-CoV-2 and other Sarbecovirus currently known to infect humans.  If clinically indicated additional testing with an alternate test  methodology (LAB7 453) is advised. The SARS-CoV-2 RNA is generally  detectable in upper and lower respiratory specimens during the acute  phase of infection. The expected result is Negative. Fact Sheet for Patients:  StrictlyIdeas.no Fact Sheet for Healthcare Providers: BankingDealers.co.za This test is not yet approved or cleared by the [place] FDA and has been authorized for detection and/or diagnosis of SARS-CoV-2 by FDA under an Emergency Use Authorization (EUA).  This EUA will remain in effect  (meaning this test can be used) for the duration of the COVID-19 declaration under Section 564(b)(1) of the Act, 21 U.S.C. section 360bbb-3(b)(1), unless the authorization is terminated or revoked sooner. Performed at [facility], [address] [place], [place] [address]   Blood Culture (routine x 2)     Status: None (Preliminary result)   Collection Time: 02/22/19 11:38 AM   Specimen: BLOOD  Result Value Ref Range Status   Specimen Description BLOOD LEFT ANTECUBITAL  Final   Special Requests   Final    BOTTLES DRAWN AEROBIC AND ANAEROBIC Blood Culture adequate volume   Culture   Final  NO GROWTH 3 DAYS Performed at [facility], [address] [place], [place] [address]    Report Status PENDING  Incomplete  Blood Culture (routine x 2)     Status: None (Preliminary result)   Collection Time: 02/22/19 11:39 AM   Specimen: BLOOD  Result Value Ref Range Status   Specimen Description BLOOD RIGHT ANTECUBITAL  Final   Special Requests   Final    BOTTLES DRAWN AEROBIC AND ANAEROBIC Blood Culture adequate volume   Culture   Final    NO GROWTH 3 DAYS Performed at [facility], [address], [place], [place] [address]    Report Status PENDING  Incomplete         Radiology Studies: No results found.      Scheduled Meds: . amiodarone  200 mg Oral Daily  . aspirin EC  81 mg Oral Daily  . bisoprolol  5 mg Oral Daily  . dexamethasone  6 mg Intravenous Q24H  . feeding supplement (ENSURE ENLIVE)  237 mL Oral BID BM  . ferrous sulfate  325 mg Oral Daily  . furosemide  40 mg Intravenous Q12H  . gabapentin  200 mg Oral QHS  . hydrALAZINE  10 mg Oral Q8H  . insulin aspart  0-5 Units Subcutaneous QHS  . insulin aspart  0-9 Units Subcutaneous TID WC  . levothyroxine  88 mcg Oral QAC breakfast  . multivitamin with minerals  1 tablet Oral Daily  . pantoprazole  40 mg Oral BID  . simvastatin  10 mg Oral QHS  . warfarin  1 mg Oral ONCE-1800  .  Warfarin - Pharmacist Dosing Inpatient   Does not apply q1800   Continuous Infusions:   LOS: 3 days    Time spent: 35 minutes      Edwin Dada, MD [facility] 02/25/2019, 8:41 AM     Please page through [place]:  [email or web address] Password [email or web address] If 7PM-7AM, please contact night-coverage

## 2019-02-25 NOTE — Care Management Important Message (Signed)
Important Message  Patient Details  Name: [name] MRN: [id number] Date of Birth: [birth year]   Medicare Important Message Given:  Yes - Important Message mailed due to current National Emergency   Verbal consent obtained due to current National Emergency  Relationship to patient: Spouse/Significant Other Contact Name: Arnecia Ector Call Date: 02/25/19  Time: [place] Phone: [phone] Outcome: Spoke with contact      Orbie Pyo 02/25/2019, 3:31 PM

## 2019-02-26 LAB — CBC WITH DIFFERENTIAL/PLATELET
Abs Immature Granulocytes: 0.1 10*3/uL — ABNORMAL HIGH (ref 0.00–0.07)
Basophils Absolute: 0 10*3/uL (ref 0.0–0.1)
Basophils Relative: 0 %
Eosinophils Absolute: 0 10*3/uL (ref 0.0–0.5)
Eosinophils Relative: 0 %
HCT: 24.1 % — ABNORMAL LOW (ref 36.0–46.0)
Hemoglobin: 8 g/dL — ABNORMAL LOW (ref 12.0–15.0)
Immature Granulocytes: 1 %
Lymphocytes Relative: 5 %
Lymphs Abs: 0.4 10*3/uL — ABNORMAL LOW (ref 0.7–4.0)
MCH: 35.6 pg — ABNORMAL HIGH (ref 26.0–34.0)
MCHC: 33.2 g/dL (ref 30.0–36.0)
MCV: 107.1 fL — ABNORMAL HIGH (ref 80.0–100.0)
Monocytes Absolute: 0.4 10*3/uL (ref 0.1–1.0)
Monocytes Relative: 6 %
Neutro Abs: 6 10*3/uL (ref 1.7–7.7)
Neutrophils Relative %: 88 %
Platelets: 186 10*3/uL (ref 150–400)
RBC: 2.25 MIL/uL — ABNORMAL LOW (ref 3.87–5.11)
RDW: 15.4 % (ref 11.5–15.5)
WBC: 7 10*3/uL (ref 4.0–10.5)
nRBC: 0 % (ref 0.0–0.2)

## 2019-02-26 LAB — COMPREHENSIVE METABOLIC PANEL
ALT: 51 U/L — ABNORMAL HIGH (ref 0–44)
AST: 31 U/L (ref 15–41)
Albumin: 2.8 g/dL — ABNORMAL LOW (ref 3.5–5.0)
Alkaline Phosphatase: 66 U/L (ref 38–126)
Anion gap: 12 (ref 5–15)
BUN: 36 mg/dL — ABNORMAL HIGH (ref 8–23)
CO2: 37 mmol/L — ABNORMAL HIGH (ref 22–32)
Calcium: 8.5 mg/dL — ABNORMAL LOW (ref 8.9–10.3)
Chloride: 92 mmol/L — ABNORMAL LOW (ref 98–111)
Creatinine, Ser: 0.84 mg/dL (ref 0.44–1.00)
GFR calc Af Amer: >60 mL/min (ref >60)
GFR calc non Af Amer: >60 mL/min (ref >60)
Glucose, Bld: 123 mg/dL — ABNORMAL HIGH (ref 70–99)
Potassium: 4.9 mmol/L (ref 3.5–5.1)
Sodium: 141 mmol/L (ref 135–145)
Total Bilirubin: 0.4 mg/dL (ref 0.3–1.2)
Total Protein: 5.8 g/dL — ABNORMAL LOW (ref 6.5–8.1)

## 2019-02-26 LAB — PROTIME-INR
INR: 2.1 — ABNORMAL HIGH (ref 0.8–1.2)
Prothrombin Time: 23.6 seconds — ABNORMAL HIGH (ref 11.4–15.2)

## 2019-02-26 LAB — D-DIMER, QUANTITATIVE: D-Dimer, Quant: 0.44 ug/mL-FEU (ref 0.00–0.50)

## 2019-02-26 LAB — PHOSPHORUS: Phosphorus: 2.7 mg/dL (ref 2.5–4.6)

## 2019-02-26 LAB — C-REACTIVE PROTEIN: CRP: 8.7 mg/dL — ABNORMAL HIGH (ref <1.0)

## 2019-02-26 LAB — FERRITIN: Ferritin: 231 ng/mL (ref 11–307)

## 2019-02-26 LAB — GLUCOSE, CAPILLARY: Glucose-Capillary: 102 mg/dL — ABNORMAL HIGH (ref 70–99)

## 2019-02-26 LAB — MAGNESIUM: Magnesium: 2.5 mg/dL — ABNORMAL HIGH (ref 1.7–2.4)

## 2019-02-26 MED ORDER — HYDRALAZINE HCL 10 MG PO TABS: 5.0000 mg | ORAL_TABLET | Freq: Three times a day (TID) | ORAL | 3 refills | Status: DC

## 2019-02-26 MED ORDER — ORAL CARE MOUTH RINSE
15.0000 mL | Freq: Two times a day (BID) | OROMUCOSAL | Status: DC
  Administered 2019-02-26: 15 mL via OROMUCOSAL

## 2019-02-26 NOTE — TOC Transition Note (Addendum)
Transition of Care [facility]) - CM/SW Discharge Note   Patient Details  Name: [name] MRN: [id number] Date of Birth: 01-12-1935  Transition of Care [facility]) CM/SW Contact:  Midge Minium RN, BSN, NCM-BC, ACM-RN [phone] (working remotely) Phone Number: 02/26/2019, 10:34 AM   Clinical Narrative:    CM spoke to the patient via phone to discuss the POC. The patient stated living at home with her spouse and being independent with her ADLs; utilizing a RW, BSC, home oxygen and shower chair. PCP verified as: Dr. Ezequiel Kayser. PT/OT eval completed with [facility] services and a BSC recommended; patient reports having a BSC and a shower chair. CMS HH Compare choices provided with [facility] selected. HH referral given to Adela Lank RN, [facility] liaison; AVS updated. Patient indicated her spouse will provide transportation home and she has an operating thermometer. Patient physical address: [address] [place]. No further needs from CM.    Final next level of care: [place] Barriers to Discharge: No Barriers Identified   Patient Goals and CMS Choice Patient states their goals for this hospitalization and ongoing recovery are:: "ready to get back home with her family" CMS Medicare.gov Compare Post Acute Care list provided to:: Patient Choice offered to / list presented to : Patient   Discharge Plan and Services            DME Arranged: N/A DME Agency: NA   HH Arranged: RN, Disease Management, PT, OT HH Agency: [place] Date [place]: 02/26/19 Time [place]: 1026 Representative spoke with at [place]: Adela Lank RN liaison  Social Determinants of Health ([place]) Interventions     Readmission Risk Interventions Readmission Risk Prevention Plan 02/26/2019  Transportation Screening Complete  Medication Review Press photographer) Complete  PCP or Specialist appointment within 3-5 days of discharge Complete  HRI or [place]  Complete  SW Recovery Care/Counseling Consult Complete  [place] Not Applicable  Some recent data might be hidden

## 2019-02-26 NOTE — Discharge Summary (Signed)
Physician Discharge Summary  [name] KLK:[id number] DOB: 1934/09/12 DOA: 02/22/2019  PCP: Jamie Kayser, MD  Admit date: 02/22/2019 Discharge date: 02/26/2019  Admitted From: Home  Disposition:  Home   Recommendations for Outpatient Follow-up:  1. Follow up with PCP in 3-4 days  2. Follow up with Cardiology by phone in 1 week 3. Home health: Please obtain BMP on Monday and fax to PCP to re-evaluate Cr, bicarb, K on furosemide and Entresto/spiro 4. Please re-check Hgb in 2 weeks 5. Check BP on new hydralazine  Home Health: Yes  Equipment/Devices: 3-in-1 commode  Discharge Condition: Fair  CODE STATUS: DO NOT RESUSCITATE Diet recommendation: Cardiac  Brief/Interim Summary: [name] is a 84 y.o. F with COPD on 2L home O2, sCHF EF 35-40%, pHTN, Afib and pacer on Warfarin, CAD and DM who presented with 10 days progressive SOB on exertion, swelling.    In the ER, CoV+, CXR negative, on home O2.     PRINCIPAL HOSPITAL DIAGNOSIS: Acute on chronic systolic CHF    Discharge Diagnoses:   Acute on chronic systolic CHF Mitral regurgitation Admitted and started on IV Lasix.  Nursing unable to measure accurate I/Os throughout stay, and documented weights were erratic, however, swelling and dyspnea with exertion resolved, patient felt back to baseline by day of discharge.  Talked with [name] partner from Cardiology by phone.  They were not asked to directly evaluate patient due to COVID-19 pandemic.  They recommended additional afterload reduction with hydralazine.  Echo and device reprogramming were deferred until resolution of COVID. -Resume oral Lasix -Continue Entresto and Spironolactone -Close monitoring electrolytes -Add hydralazine -Close follow up Cardiology   Coronavirus pneumonitis without acute hypoxic respiratory failure In setting of ongoing 2020 COVID-19 pandemic.  Her malaise began >7 days prior to admission, and her respiratory symptoms in the  hospital were attributed to CHF.  She had no fever and no symptoms attributed to worsening COVID during her 5 days in the hospital.  Treated with steroids for 5 days.   -Follow up with PCP  Atrial fibrillation, chronic History of mitral and tricuspid valve repair  Coronary disease secondary prevention  Hypokalemia  Hypothyroidism  Diabetes Glucoses were well controlled during hospital stay  Anemia of chronic disease Hemoglobin trended down during stay.  No clinical bleeding, although she is on anticoagulation. -Repeat Hgb in 1 week           Discharge Instructions  Discharge Instructions    Diet - low sodium heart healthy   Complete by: As directed    Discharge instructions   Complete by: As directed    From [name]: You were admitted for a heart failure flare.  You were also found to have COVID.  For the heart failure, you were treated with IV Lasix for 4 days. Go back to taking the new dose of Lasix (40 mg in the morning, 20 mg at night) prescribed by [name]), including this afternoon (Friday) Resume all your other home meds (Entresto and Spironolactone) Have the home health agency check your labs on Monday the 22nd (If they can't check your labs, go to your primary care doctors office)  Weigh yourself daily Call [name] office to arrange follow up You have one new medicine for your heart: hydralazine 5 mg (1/2 tab) three times a day Discuss if this is a medicine you should take long term with [name]     YOU SHOULD SELF-ISOLATE for 10 days after you are discharged. If  you are totally without COVID symptoms at home, and wish to end your self isolation earlier, call the [facility].  They should be the final authority on when to end your isolation, and I defer to them to end your isolation at any time.   Check your oxygen levels with a pulse oximeter twice a day for the next week.  If your oxygen level is 87% or less, turn up  your oxygen to 3 liters. If this doesn't fix the problem, or if you have new severe trouble breathing at any time, call 9-1-1.   Increase activity slowly   Complete by: As directed    MyChart COVID-19 home monitoring program   Complete by: Feb 26, 2019    Is the patient willing to use the [facility] for home monitoring?: Yes   Temperature monitoring   Complete by: Feb 26, 2019    After how many days would you like to receive a notification of this patient's flowsheet entries?: 1     Allergies as of 02/26/2019      Reactions   Ace Inhibitors Cough, Other (See Comments)   Amoxicillin-pot Clavulanate Swelling, Other (See Comments)   JOINTS, PAIN Joint pain    Penicillins Swelling, Other (See Comments)   SWOLLEN JOINTS  Has patient had a PCN reaction causing immediate rash, facial/tongue/throat swelling, SOB or lightheadedness with hypotension: Yes Has patient had a PCN reaction causing severe rash involving mucus membranes or skin necrosis: No Has patient had a PCN reaction that required hospitalization No Has patient had a PCN reaction occurring within the last 10 years: No If all of the above answers are "NO", then may proceed with Cephalosporin use. Swollen joints   Nifedipine Other (See Comments)   UNSPECIFIED UNSPECIFIED   Propranolol Other (See Comments)   UNSPECIFIED UNSPECIFIED   Diazepam Other (See Comments)   Made her "hyper"   Meperidine Other (See Comments)   Made her "hyper"   Propoxyphene Other (See Comments)   Made her "hyper"      Medication List    TAKE these medications   AMBULATORY NON FORMULARY MEDICATION [facility] at [facility] with tubing. DX: COPD DX Code: J44.9   amiodarone 200 MG tablet Commonly known as: PACERONE TAKE 1 TABLET BY MOUTH EVERY DAY Notes to patient: 02/27/19   aspirin 81 MG EC tablet Take 1 tablet (81 mg total) by mouth daily. Notes to patient: 02/27/19   bisoprolol 5 MG tablet Commonly known as: ZEBETA Take 1  tablet (5 mg total) by mouth daily. Notes to patient: 02/27/19   diphenhydrAMINE 25 mg capsule Commonly known as: BENADRYL Take 25 mg by mouth every 6 (six) hours as needed for allergies.   ferrous sulfate 325 (65 FE) MG tablet Take 325 mg by mouth daily with breakfast. Notes to patient: 02/27/19   furosemide 20 MG tablet Commonly known as: LASIX Take 20-40 mg by mouth 2 (two) times daily. Take 40mg  morning and 20mg  in the evening Notes to patient: 02/26/19 tonight    gabapentin 100 MG capsule Commonly known as: NEURONTIN Take 200 mg by mouth at bedtime. Notes to patient: 02/26/19 tonight   hydrALAZINE 10 MG tablet Commonly known as: APRESOLINE Take 0.5 tablets (5 mg total) by mouth every 8 (eight) hours. Notes to patient: 02/26/19   ipratropium-albuterol 0.5-2.5 (3) MG/3ML Soln Commonly known as: DUONEB Take 3 mLs by nebulization every 6 (six) hours as needed. DX:J44.9   levothyroxine 88 MCG tablet Commonly known as:  SYNTHROID Take 88 mcg by mouth daily before breakfast. Notes to patient: 02/27/19   pantoprazole 40 MG tablet Commonly known as: PROTONIX Take 40 mg by mouth 2 (two) times daily. Notes to patient: 02/26/19 tonight   PRESERVISION AREDS 2 PO Take 2 tablets by mouth daily. Notes to patient: 02/27/19   sacubitril-valsartan 24-26 MG Commonly known as: ENTRESTO Take 1 tablet by mouth 2 (two) times daily.   simvastatin 10 MG tablet Commonly known as: ZOCOR Take 1 tablet (10 mg total) by mouth at bedtime. Notes to patient: 02/26/19   spironolactone 25 MG tablet Commonly known as: ALDACTONE Take 0.5 tablets (12.5 mg total) by mouth daily.   warfarin 2.5 MG tablet Commonly known as: COUMADIN Take as directed. If you are unsure how to take this medication, talk to your nurse or doctor. Original instructions: TAKE AS DIRECTED BY COUMADIN CLINIC What changed: See the new instructions. Notes to patient: 02/26/19 tonight      Follow-up Information    Care,  [facility] Follow up.   Specialty: Home Health Services Why: Registered Nurse, Home Health Physical Therapy and Occupational Therapy Contact information: [place] [address] [phone]          Allergies  Allergen Reactions  . Ace Inhibitors Cough and Other (See Comments)  . Amoxicillin-Pot Clavulanate Swelling and Other (See Comments)    JOINTS, PAIN Joint pain   . Penicillins Swelling and Other (See Comments)    SWOLLEN JOINTS  Has patient had a PCN reaction causing immediate rash, facial/tongue/throat swelling, SOB or lightheadedness with hypotension: Yes Has patient had a PCN reaction causing severe rash involving mucus membranes or skin necrosis: No Has patient had a PCN reaction that required hospitalization No Has patient had a PCN reaction occurring within the last 10 years: No If all of the above answers are "NO", then may proceed with Cephalosporin use.  Swollen joints  . Nifedipine Other (See Comments)    UNSPECIFIED UNSPECIFIED   . Propranolol Other (See Comments)    UNSPECIFIED UNSPECIFIED   . Diazepam Other (See Comments)    Made her "hyper"   . Meperidine Other (See Comments)    Made her "hyper"    . Propoxyphene Other (See Comments)    Made her "hyper"      Consultations:  None   Procedures/Studies: Dg Chest Portable 1 View  Result Date: 02/22/2019 CLINICAL DATA:  Cough and shortness of breath with chest congestion. Swelling the lower extremities. EXAM: PORTABLE CHEST 1 VIEW COMPARISON:  01/25/2019 FINDINGS: Chronic cardiomegaly. Multiple valve replacements. Atrial clip place. Pulmonary venous hypertension, without frank edema. No measurable effusion. Chronic calcified granuloma right mid chest chronic basilar lung markings. IMPRESSION: Chronic cardiomegaly. Pulmonary venous hypertension. No frank edema visible pleural effusion. Electronically Signed   By: Nelson Chimes M.D.   On: 02/22/2019 10:14       Subjective: No orthopnea.  Swelling resolved.  No chest pain or dyspnea.  No palpitations.  No fever or sputum.  Discharge Exam: Vitals:   02/26/19 0600 02/26/19 0800  BP:  (!) 145/63  Pulse: 65 (!) 59  Resp: (!) 21 13  Temp:  98.2 F (36.8 C)  SpO2: 93% 100%   Vitals:   02/26/19 0413 02/26/19 0500 02/26/19 0600 02/26/19 0800  BP: (!) 149/57   (!) 145/63  Pulse: 65 (!) 59 65 (!) 59  Resp: (!) 22 13 (!) 21 13  Temp: 97.9 F (36.6 C)   98.2 F (  36.8 C)  TempSrc: Oral   Oral  SpO2: 92% 96% 93% 100%  Weight: 57.7 kg     Height:        General: Pt is alert, awake, not in acute distress Cardiovascular: RRR, nl S1-S2, no murmurs appreciated.   No LE edema.   Respiratory: Normal respiratory rate and rhythm. Atelectasis at bases.  No wheezing. Abdominal: Abdomen soft and non-tender.  No distension or HSM.   Neuro/Psych: Strength symmetric in upper and lower extremities.  Judgment and insight appear normal.   The results of significant diagnostics from this hospitalization (including imaging, microbiology, ancillary and laboratory) are listed below for reference.     Microbiology: Recent Results (from the past 240 hour(s))  SARS Coronavirus 2 (CEPHEID- Performed in [place] hospital lab), Hosp Order     Status: Abnormal   Collection Time: 02/22/19  9:50 AM   Specimen: Nasopharyngeal Swab  Result Value Ref Range Status   SARS Coronavirus 2 POSITIVE (A) NEGATIVE Final    Comment: RESULT CALLED TO, READ BACK BY AND VERIFIED WITH: ALICIA GRAINGER @1055  ON 02/22/2019 BY FMW (NOTE) If result is NEGATIVE SARS-CoV-2 target nucleic acids are NOT DETECTED. The SARS-CoV-2 RNA is generally detectable in upper and lower  respiratory specimens during the acute phase of infection. The lowest  concentration of SARS-CoV-2 viral copies this assay can detect is 250  copies / mL. A negative result does not preclude SARS-CoV-2 infection  and should not be used as the sole basis for  treatment or other  patient management decisions.  A negative result may occur with  improper specimen collection / handling, submission of specimen other  than nasopharyngeal swab, presence of viral mutation(s) within the  areas targeted by this assay, and inadequate number of viral copies  (<250 copies / mL). A negative result must be combined with clinical  observations, patient history, and epidemiological information. If result is POSITIVE SARS-CoV-2 target nucleic acids are DETEC TED. The SARS-CoV-2 RNA is generally detectable in upper and lower  respiratory specimens during the acute phase of infection.  Positive  results are indicative of active infection with SARS-CoV-2.  Clinical  correlation with patient history and other diagnostic information is  necessary to determine patient infection status.  Positive results do  not rule out bacterial infection or co-infection with other viruses. If result is PRESUMPTIVE POSTIVE SARS-CoV-2 nucleic acids MAY BE PRESENT.   A presumptive positive result was obtained on the submitted specimen  and confirmed on repeat testing.  While 2019 novel coronavirus  (SARS-CoV-2) nucleic acids may be present in the submitted sample  additional confirmatory testing may be necessary for epidemiological  and / or clinical management purposes  to differentiate between  SARS-CoV-2 and other Sarbecovirus currently known to infect humans.  If clinically indicated additional testing with an alternate test  methodology (LAB7 453) is advised. The SARS-CoV-2 RNA is generally  detectable in upper and lower respiratory specimens during the acute  phase of infection. The expected result is Negative. Fact Sheet for Patients:  StrictlyIdeas.no Fact Sheet for Healthcare Providers: BankingDealers.co.za This test is not yet approved or cleared by the [place] FDA and has been authorized for detection and/or  diagnosis of SARS-CoV-2 by FDA under an Emergency Use Authorization (EUA).  This EUA will remain in effect (meaning this test can be used) for the duration of the COVID-19 declaration under Section 564(b)(1) of the Act, 21 U.S.C. section 360bbb-3(b)(1), unless the authorization is terminated or revoked sooner. Performed  at [facility], [address] [place], [place] [address]   Blood Culture (routine x 2)     Status: None (Preliminary result)   Collection Time: 02/22/19 11:38 AM   Specimen: BLOOD  Result Value Ref Range Status   Specimen Description BLOOD LEFT ANTECUBITAL  Final   Special Requests   Final    BOTTLES DRAWN AEROBIC AND ANAEROBIC Blood Culture adequate volume   Culture   Final    NO GROWTH 4 DAYS Performed at [facility], [address] [place], [place] [address]    Report Status PENDING  Incomplete  Blood Culture (routine x 2)     Status: None (Preliminary result)   Collection Time: 02/22/19 11:39 AM   Specimen: BLOOD  Result Value Ref Range Status   Specimen Description BLOOD RIGHT ANTECUBITAL  Final   Special Requests   Final    BOTTLES DRAWN AEROBIC AND ANAEROBIC Blood Culture adequate volume   Culture   Final    NO GROWTH 4 DAYS Performed at [facility], [address] [place], [place] [address]    Report Status PENDING  Incomplete     Labs: BNP (last 3 results) Recent Labs    07/17/18 1838 02/22/19 0950  BNP 477.0* 517.0*   Basic Metabolic Panel: Recent Labs  Lab 02/22/19 0950 02/23/19 0416 02/24/19 0506 02/25/19 0400 02/26/19 0300  NA 139 137 142 143 141  K 3.3* 3.9 4.4 4.4 4.9  CL 96* 96* 98 95* 92*  CO2 32 30 31 35* 37*  GLUCOSE 127* 125* 181* 107* 123*  BUN 24* 26* 34* 30* 36*  CREATININE 0.77 0.67 0.91 0.77 0.84  CALCIUM 8.2* 8.6* 9.4 8.6* 8.5*  MG  --  2.5* 2.4 2.4 2.5*  PHOS  --  3.2 3.8 3.0 2.7   Liver Function Tests: Recent Labs  Lab 02/22/19 0950 02/23/19 0416 02/24/19 0506  02/25/19 0400 02/26/19 0300  AST 40 30 38 25 31  ALT 60* 52* 60* 43 51*  ALKPHOS 73 64 77 65 66  BILITOT 0.6 0.6 0.7 0.5 0.4  PROT 6.6 6.3* 7.1 6.1* 5.8*  ALBUMIN 3.5 3.2* 3.4* 3.0* 2.8*   CBC: Recent Labs  Lab 02/22/19 0950 02/23/19 0416 02/24/19 0506 02/25/19 0400 02/26/19 0300  WBC 5.5 2.3* 17.2* 8.5 7.0  NEUTROABS 4.6 2.0 15.9* 7.4 6.0  HGB 9.2* 9.0* 9.8* 8.7* 8.0*  HCT 29.6* 28.3* 32.8* 28.0* 24.1*  MCV 103.1* 102.5* 104.1* 104.9* 107.1*  PLT 123* 129* 239 185 186   Cardiac Enzymes: Recent Labs  Lab 02/22/19 0950  TROPONINI <0.03   CBG: Recent Labs  Lab 02/25/19 0738 02/25/19 1132 02/25/19 1604 02/25/19 2102 02/26/19 0832  GLUCAP 116* 378* 280* 139* 102*   D-Dimer Recent Labs    02/25/19 0400 02/26/19 0300  DDIMER 0.35 0.44   Anemia work up Recent Labs    02/25/19 0400 02/26/19 0300  FERRITIN 265 231   Urinalysis    Component Value Date/Time   COLORURINE YELLOW 11/13/2015 0654   APPEARANCEUR CLEAR 11/13/2015 0654   LABSPEC 1.020 11/13/2015 0654   PHURINE 5.0 11/13/2015 0654   GLUCOSEU NEGATIVE 11/13/2015 0654   HGBUR SMALL (A) 11/13/2015 0654   BILIRUBINUR NEGATIVE 11/13/2015 0654   KETONESUR NEGATIVE 11/13/2015 0654   PROTEINUR NEGATIVE 11/13/2015 0654   UROBILINOGEN 0.2 04/21/2015 1244   NITRITE NEGATIVE 11/13/2015 0654   LEUKOCYTESUR NEGATIVE 11/13/2015 0654   Microbiology Recent Results (from the past 240 hour(s))  SARS Coronavirus 2 (CEPHEID-  Performed in [facility] hospital lab), Hosp Order     Status: Abnormal   Collection Time: 02/22/19  9:50 AM   Specimen: Nasopharyngeal Swab  Result Value Ref Range Status   SARS Coronavirus 2 POSITIVE (A) NEGATIVE Final    Comment: RESULT CALLED TO, READ BACK BY AND VERIFIED WITH: ALICIA GRAINGER @1055  ON 02/22/2019 BY FMW (NOTE) If result is NEGATIVE SARS-CoV-2 target nucleic acids are NOT DETECTED. The SARS-CoV-2 RNA is generally detectable in upper and lower  respiratory specimens  during the acute phase of infection. The lowest  concentration of SARS-CoV-2 viral copies this assay can detect is 250  copies / mL. A negative result does not preclude SARS-CoV-2 infection  and should not be used as the sole basis for treatment or other  patient management decisions.  A negative result may occur with  improper specimen collection / handling, submission of specimen other  than nasopharyngeal swab, presence of viral mutation(s) within the  areas targeted by this assay, and inadequate number of viral copies  (<250 copies / mL). A negative result must be combined with clinical  observations, patient history, and epidemiological information. If result is POSITIVE SARS-CoV-2 target nucleic acids are DETEC TED. The SARS-CoV-2 RNA is generally detectable in upper and lower  respiratory specimens during the acute phase of infection.  Positive  results are indicative of active infection with SARS-CoV-2.  Clinical  correlation with patient history and other diagnostic information is  necessary to determine patient infection status.  Positive results do  not rule out bacterial infection or co-infection with other viruses. If result is PRESUMPTIVE POSTIVE SARS-CoV-2 nucleic acids MAY BE PRESENT.   A presumptive positive result was obtained on the submitted specimen  and confirmed on repeat testing.  While 2019 novel coronavirus  (SARS-CoV-2) nucleic acids may be present in the submitted sample  additional confirmatory testing may be necessary for epidemiological  and / or clinical management purposes  to differentiate between  SARS-CoV-2 and other Sarbecovirus currently known to infect humans.  If clinically indicated additional testing with an alternate test  methodology (LAB7 453) is advised. The SARS-CoV-2 RNA is generally  detectable in upper and lower respiratory specimens during the acute  phase of infection. The expected result is Negative. Fact Sheet for Patients:   StrictlyIdeas.no Fact Sheet for Healthcare Providers: BankingDealers.co.za This test is not yet approved or cleared by the [place] FDA and has been authorized for detection and/or diagnosis of SARS-CoV-2 by FDA under an Emergency Use Authorization (EUA).  This EUA will remain in effect (meaning this test can be used) for the duration of the COVID-19 declaration under Section 564(b)(1) of the Act, 21 U.S.C. section 360bbb-3(b)(1), unless the authorization is terminated or revoked sooner. Performed at [facility], [address] [place], [place] [address]   Blood Culture (routine x 2)     Status: None (Preliminary result)   Collection Time: 02/22/19 11:38 AM   Specimen: BLOOD  Result Value Ref Range Status   Specimen Description BLOOD LEFT ANTECUBITAL  Final   Special Requests   Final    BOTTLES DRAWN AEROBIC AND ANAEROBIC Blood Culture adequate volume   Culture   Final    NO GROWTH 4 DAYS Performed at [facility], [address] [place], [place] [address]    Report Status PENDING  Incomplete  Blood Culture (routine x 2)     Status: None (Preliminary result)   Collection Time: 02/22/19 11:39 AM   Specimen: BLOOD  Result Value Ref Range Status   Specimen Description BLOOD RIGHT ANTECUBITAL  Final   Special Requests   Final    BOTTLES DRAWN AEROBIC AND ANAEROBIC Blood Culture adequate volume   Culture   Final    NO GROWTH 4 DAYS Performed at [address] [place], [place] [address]    Report Status PENDING  Incomplete     Time coordinating discharge:25 minutes      SIGNED:   Edwin Dada, MD  [facility] 02/26/2019, 6:27 PM

## 2019-02-26 NOTE — Progress Notes (Signed)
Reviewed AVS with patient, patient verbalized understanding.

## 2019-02-26 NOTE — Progress Notes (Signed)
ANTICOAGULATION CONSULT NOTE - Follow Up Consult  Pharmacy Consult for warfarin Indication: atrial fibrillation   Patient Measurements: Height: 5\' 1"  (154.9 cm) Weight: 127 lb 3.3 oz (57.7 kg) IBW/kg (Calculated) : 47.8  Vital Signs: Temp: 98.2 F (36.8 C) (06/19 0800) Temp Source: Oral (06/19 0800) BP: 145/63 (06/19 0800) Pulse Rate: 59 (06/19 0800)  Labs: Recent Labs    02/24/19 0506 02/25/19 0400 02/26/19 0300  HGB 9.8* 8.7* 8.0*  HCT 32.8* 28.0* 24.1*  PLT 239 185 186  LABPROT 32.8* 28.4* 23.6*  INR 3.3* 2.7* 2.1*  CREATININE 0.91 0.77 0.84    Estimated Creatinine Clearance: 42.2 mL/min (by C-G formula based on SCr of 0.84 mg/dL).   Medications:  Scheduled:  . amiodarone  200 mg Oral Daily  . aspirin EC  81 mg Oral Daily  . bisoprolol  5 mg Oral Daily  . dexamethasone  6 mg Intravenous Q24H  . feeding supplement (ENSURE ENLIVE)  237 mL Oral BID BM  . ferrous sulfate  325 mg Oral Daily  . furosemide  20 mg Oral q1800  . furosemide  40 mg Oral Q breakfast  . gabapentin  200 mg Oral QHS  . hydrALAZINE  5 mg Oral Q8H  . insulin aspart  0-5 Units Subcutaneous QHS  . insulin aspart  0-9 Units Subcutaneous TID WC  . levothyroxine  88 mcg Oral QAC breakfast  . mouth rinse  15 mL Mouth Rinse BID  . multivitamin with minerals  1 tablet Oral Daily  . pantoprazole  40 mg Oral BID  . simvastatin  10 mg Oral QHS  . Warfarin - Pharmacist Dosing Inpatient   Does not apply q1800   Infusions:    Assessment: 63 yoF admitted on 6/15 with CHF, and COVID-19+.  PMH includes chronic warfarin anticoagulation for afib (s/p [place] preocedure, bioprosthetic mitral valve replacement, tricuspid valve repair), and chronic anemia.  Pharmacy is consulted to continue warfarin dosing inpatient. PTA warfarin 2.5 mg daily except 1.25 mg on Mon, Wed, Fridays.  LD 6/14. Admission INR 3.5.   Today, 02/26/2019: INR 2.1, decreased but remains therapeutic range after holding x3 days CBC: Hgb  decreased to 8, Plt stable.  No bleeding or complications reported. Drug-drug interaction: amiodarone (chronic home med)   Goal of Therapy:  INR 2-3 Monitor platelets by anticoagulation protocol: Yes   Plan:  Resume home warfarin dose at discharge, no further inpatient doses ordered with pending discharge. Daily PT/INR. Monitor for signs and symptoms of bleeding.  Gretta Arab PharmD, BCPS Clinical Pharmacist Clinical pharmacist phone 7am- 5pm: [phone] 02/26/2019 12:36 PM

## 2019-02-27 LAB — CULTURE, BLOOD (ROUTINE X 2)
Culture: NO GROWTH
Culture: NO GROWTH
Special Requests: ADEQUATE
Special Requests: ADEQUATE

## 2019-03-02 ENCOUNTER — Encounter: Payer: Medicare Other | Admitting: Internal Medicine

## 2019-03-09 ENCOUNTER — Telehealth: Payer: Self-pay

## 2019-03-09 NOTE — Telephone Encounter (Signed)
Spoke with the [name] and adv her that Dr. Fletcher Anon is recommending that we convert her o/v to a virtual visit due to her recently being tested positive for COVID.  [name] is agreeable. She does not have a smart device but is agreeable with a telephone visit. Adv the that the appt will be for the same date and time. Adv her that she will receive a call 20 min prior to the appt. Asked to have her most recent vital available.   Verbal consent for telehealth obtained.   YOUR CARDIOLOGY TEAM HAS ARRANGED FOR AN E-VISIT FOR YOUR APPOINTMENT - PLEASE REVIEW IMPORTANT INFORMATION BELOW SEVERAL DAYS PRIOR TO YOUR APPOINTMENT  Due to the recent COVID-19 pandemic, we are transitioning in-person office visits to tele-medicine visits in an effort to decrease unnecessary exposure to our patients, their families, and staff. These visits are billed to your insurance just like a normal visit is. We also encourage you to sign up for MyChart if you have not already done so. You will need a smartphone if possible. For patients that do not have this, we can still complete the visit using a regular telephone but do prefer a smartphone to enable video when possible. You may have a family member that lives with you that can help. If possible, we also ask that you have a blood pressure cuff and scale at home to measure your blood pressure, heart rate and weight prior to your scheduled appointment. Patients with clinical needs that need an in-person evaluation and testing will still be able to come to the office if absolutely necessary. If you have any questions, feel free to call our office.     YOUR PROVIDER WILL BE USING THE FOLLOWING PLATFORM TO COMPLETE YOUR VISIT:   . IF USING DOXIMITY or [email or web address] - The staff will give you instructions on receiving your link to join the meeting the day of your visit.      2-3 DAYS BEFORE YOUR APPOINTMENT  You will receive a telephone call from one of our [facility] team members - your  caller ID may say "Unknown caller." If this is a video visit, we will walk you through how to get the video launched on your phone. We will remind you check your blood pressure, heart rate and weight prior to your scheduled appointment. If you have an Apple Watch or Kardia, please upload any pertinent ECG strips the day before or morning of your appointment to [place]. Our staff will also make sure you have reviewed the consent and agree to move forward with your scheduled tele-health visit.     THE DAY OF YOUR APPOINTMENT  Approximately 15 minutes prior to your scheduled appointment, you will receive a telephone call from one of [facility] team - your caller ID may say "Unknown caller."  Our staff will confirm medications, vital signs for the day and any symptoms you may be experiencing. Please have this information available prior to the time of visit start. It may also be helpful for you to have a pad of paper and pen handy for any instructions given during your visit. They will also walk you through joining the smartphone meeting if this is a video visit.    CONSENT FOR TELE-HEALTH VISIT - PLEASE REVIEW  I hereby voluntarily request, consent and authorize [facility] and its employed or contracted physicians, physician assistants, nurse practitioners or other licensed health care professionals (the Practitioner), to provide me with telemedicine health care services (the "Services") as deemed  necessary by the treating Practitioner. I acknowledge and consent to receive the Services by the Practitioner via telemedicine. I understand that the telemedicine visit will involve communicating with the Practitioner through live audiovisual communication technology and the disclosure of certain medical information by electronic transmission. I acknowledge that I have been given the opportunity to request an in-person assessment or other available alternative prior to the telemedicine visit and am voluntarily  participating in the telemedicine visit.  I understand that I have the right to withhold or withdraw my consent to the use of telemedicine in the course of my care at any time, without affecting my right to future care or treatment, and that the Practitioner or I may terminate the telemedicine visit at any time. I understand that I have the right to inspect all information obtained and/or recorded in the course of the telemedicine visit and may receive copies of available information for a reasonable fee.  I understand that some of the potential risks of receiving the Services via telemedicine include:  Marland Kitchen Delay or interruption in medical evaluation due to technological equipment failure or disruption; . Information transmitted may not be sufficient (e.g. poor resolution of images) to allow for appropriate medical decision making by the Practitioner; and/or  . In rare instances, security protocols could fail, causing a breach of personal health information.  Furthermore, I acknowledge that it is my responsibility to provide information about my medical history, conditions and care that is complete and accurate to the best of my ability. I acknowledge that Practitioner's advice, recommendations, and/or decision may be based on factors not within their control, such as incomplete or inaccurate data provided by me or distortions of diagnostic images or specimens that may result from electronic transmissions. I understand that the practice of medicine is not an exact science and that Practitioner makes no warranties or guarantees regarding treatment outcomes. I acknowledge that I will receive a copy of this consent concurrently upon execution via email to the email address I last provided but may also request a printed copy by calling the office of [facility].    I understand that my insurance will be billed for this visit.   I have read or had this consent read to me. . I understand the contents of this  consent, which adequately explains the benefits and risks of the Services being provided via telemedicine.  . I have been provided ample opportunity to ask questions regarding this consent and the Services and have had my questions answered to my satisfaction. . I give my informed consent for the services to be provided through the use of telemedicine in my medical care  By participating in this telemedicine visit I agree to the above.

## 2019-03-11 ENCOUNTER — Other Ambulatory Visit: Payer: Self-pay

## 2019-03-11 ENCOUNTER — Telehealth (INDEPENDENT_AMBULATORY_CARE_PROVIDER_SITE_OTHER): Payer: Medicare Other | Admitting: Cardiovascular Disease

## 2019-03-11 ENCOUNTER — Encounter: Payer: Self-pay | Admitting: Cardiovascular Disease

## 2019-03-11 ENCOUNTER — Telehealth: Payer: Self-pay

## 2019-03-11 ENCOUNTER — Ambulatory Visit (INDEPENDENT_AMBULATORY_CARE_PROVIDER_SITE_OTHER): Payer: Medicare Other | Admitting: *Deleted

## 2019-03-11 VITALS — BP 112/65 | HR 66 | Ht 61.5 in | Wt 118.0 lb

## 2019-03-11 DIAGNOSIS — I2510 Atherosclerotic heart disease of native coronary artery without angina pectoris: Secondary | ICD-10-CM

## 2019-03-11 DIAGNOSIS — I442 Atrioventricular block, complete: Secondary | ICD-10-CM | POA: Diagnosis not present

## 2019-03-11 DIAGNOSIS — I5022 Chronic systolic (congestive) heart failure: Secondary | ICD-10-CM

## 2019-03-11 DIAGNOSIS — I4819 Other persistent atrial fibrillation: Secondary | ICD-10-CM

## 2019-03-11 DIAGNOSIS — Z953 Presence of xenogenic heart valve: Secondary | ICD-10-CM

## 2019-03-11 LAB — CUP PACEART REMOTE DEVICE CHECK
Battery Remaining Longevity: 66 mo
Battery Voltage: 3 V
Brady Statistic AP VP Percent: 96.93 %
Brady Statistic AP VS Percent: 0.01 %
Brady Statistic AS VP Percent: 3.06 %
Brady Statistic AS VS Percent: 0 %
Brady Statistic RA Percent Paced: 96.89 %
Brady Statistic RV Percent Paced: 99.94 %
Date Time Interrogation Session: 20200702110532
Implantable Lead Implant Date: 20160823
Implantable Lead Implant Date: 20160823
Implantable Lead Location: 753859
Implantable Lead Location: 753860
Implantable Lead Model: 5076
Implantable Lead Model: 5076
Implantable Pulse Generator Implant Date: 20160823
Lead Channel Impedance Value: 342 Ohm
Lead Channel Impedance Value: 380 Ohm
Lead Channel Impedance Value: 380 Ohm
Lead Channel Impedance Value: 380 Ohm
Lead Channel Pacing Threshold Amplitude: 0.625 V
Lead Channel Pacing Threshold Pulse Width: 0.4 ms
Lead Channel Sensing Intrinsic Amplitude: 1.375 mV
Lead Channel Sensing Intrinsic Amplitude: 11.625 mV
Lead Channel Setting Pacing Amplitude: 2 V
Lead Channel Setting Pacing Amplitude: 2.5 V
Lead Channel Setting Pacing Pulse Width: 0.4 ms
Lead Channel Setting Sensing Sensitivity: 2.8 mV

## 2019-03-11 NOTE — Telephone Encounter (Signed)
Dr.Arida would like the pt [facility] nurse to draw labs when she is back out to the pt home. Spoke with the pt homehealth nurse Hardie Pulley, RN with [facility]. Verbal order given to draw a Bmet and Cbc on 03/15/19. [facility] will fax the results to our office attn: Dr. Fletcher Anon. Our office fax and phone number provided.

## 2019-03-11 NOTE — Progress Notes (Signed)
Virtual Visit via Telephone Note   This visit type was conducted due to national recommendations for restrictions regarding the COVID-19 Pandemic (e.g. social distancing) in an effort to limit this patient's exposure and mitigate transmission in our community.  Due to her co-morbid illnesses, this patient is at least at moderate risk for complications without adequate follow up.  This format is felt to be most appropriate for this patient at this time.  The patient did not have access to video technology/had technical difficulties with video requiring transitioning to audio format only (telephone).  All issues noted in this document were discussed and addressed.  No physical exam could be performed with this format.  Please refer to the patient's chart for her  consent to telehealth for [facility].   Date:  03/11/2019   ID:  [name], DOB [birth year], MRN [id number]  Patient Location: Home Provider Location: Office  PCP:  Ezequiel Kayser, MD  Cardiologist:  Kathlyn Sacramento, MD  Electrophysiologist:  None   Evaluation Performed:  Follow-Up Visit  Chief Complaint: Shortness of breath  History of Present Illness:    [name] is a 84 y.o. female who was reached via phone for follow-up visit regarding chronic systolic heart failure, atrial fibrillation and mitral regurgitation.  She has known history of COPD, breast cancer status post right partial mastectomy, hypothyroidism, hyperlipidemia and Mnire disease.   She underwent mitral valve replacement with a bioprosthetic valve, tricuspid valve repair, one-vessel CABG with LIMA to LAD and maze procedure in August 2016. This was complicated by sternal wound infection which required debridement. She also had complete heart block and underwent permanent pacemaker placement.  In 08/2017, she had worsening heart failure.  Echocardiogram showed an EF of 30 to 35%.She was seen by EP and it was felt that LV dysfunction was in the context of RV  apical pacing. That was inactivated and she noted immediate improvement in symptoms. She again had worsening heart failure symptoms in late 2019.  She had a drop in EF to 20 to 25% in the setting of atrial fibrillation with RVR.  She was rate controlled and started on amiodarone and subsequently underwent cardioversion with restoration of sinus rhythm.  EF improved to 35 to 40% after that.   Due to continued symptoms of heart failure, right heart catheterization was done on June 1 which showed moderately elevated filling pressures, severe pulmonary hypertension and mildly reduced cardiac output.  There was no evidence of left-to-right intracardiac shunting by saturation run.  Prominent V waves were noted on pulmonary capillary wedge pressure tracing suggestive of significant mitral regurgitation.  I increase the dose of furosemide after that with recommendations to repeat echocardiogram in few months and if EF remains below 40%, proceed with plans upgrade pacemaker to CRT as discussed before by EP.  She presented after that with worsening shortness of breath and was diagnosed with COVID-19.  She was hospitalized briefly at [facility] and it improved with diuresis.  She was also treated with steroids for pneumonitis.  Her hemoglobin trended down during her stay but she did not require transfusion.  She has been doing reasonably well with improvement in shortness of breath.  However, she continues to complain of leg edema although her weight is down from before.  She takes her medications regularly.   The patient does not have symptoms concerning for COVID-19 infection (fever, chills, cough, or new shortness of breath).    Past Medical History:  Diagnosis Date  .  Anxiety   . Arthritis    "hands" (11/10/2015)  . CAD s/p CABG x 1    a. 04/2015 LIMA to LAD  . Chronic combined systolic (congestive) and diastolic (congestive) heart failure ([place])    a. 01/2015 Echo: EF 50-55%, Gr2 DD (preop valve  surgery); b. 08/2015 Echo: EF 30-35%, diff HK. Gr2 DD; c. 09/2016 Echo: EF 50-55%. Nl fxning TV ring. Mildly dil LA.  Marland Kitchen COPD (chronic obstructive pulmonary disease) ([place])   . Coronary artery disease   . GERD (gastroesophageal reflux disease)   . History of colon polyps   . History of mitral valve replacement    a. 04/2015 s/p 27 mm [facility] Mitral bovine bioprosthetic tissue valve  . Hyperlipidemia   . Hypertension   . Hypothyroidism   . 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 04/2015 Complete bilateral atrial lesion set using cryothermy and bipolar radiofrequency ablation with clipping of LA appendage  . Meniere disease   . Meniere's disease   . On home oxygen therapy    "2L at night" (11/10/2015)  . PAF (paroxysmal atrial fibrillation) ([place])   . Pneumonia ~ 2010  . PONV (postoperative nausea and vomiting)    Pt felt like eardrums were gonna pop  . Post-surgical complete heart block, symptomatic    a. 04/2015 s/p MDT [id number] Advisa DR MRI DC PPM.  . Presence of permanent cardiac pacemaker   . Pulmonary hypertension ([place])   . Surgical wound, non healing - chest wall 11/10/2015  . Tricuspid Regurgitation s/p Repair    a. 04/2015 s/p 26 mm Edwards mc3 ring annuloplasty  . Wound infection 10/13/2015   Superficial sternal wound infection   Past Surgical History:  Procedure Laterality Date  . APPENDECTOMY    . APPLICATION OF A-CELL OF CHEST/ABDOMEN N/A 11/21/2015   Procedure: APPLICATION OF A-CELL OF CHEST/ABDOMEN;  Surgeon: Loel Lofty Dillingham, DO;  Location: [place];  Service: Plastics;  Laterality: N/A;  . APPLICATION OF A-CELL OF CHEST/ABDOMEN Right 12/21/2015   Procedure: APPLICATION OF A-CELL OF RIGHT CHEST;  Surgeon: Loel Lofty Dillingham, DO;  Location: [place];  Service: Plastics;  Laterality: Right;  . APPLICATION OF A-CELL OF CHEST/ABDOMEN Right 01/11/2016   Procedure: APPLICATION OF A-CELL OF RIGHT CHEST;  Surgeon: Loel Lofty Dillingham, DO;  Location: [place];  Service: Plastics;   Laterality: Right;  . APPLICATION OF A-CELL OF CHEST/ABDOMEN Right 02/12/2016   Procedure: APPLICATION OF A-CELL TO RIGHT CHEST WOUND;  Surgeon: Loel Lofty Dillingham, DO;  Location: [place];  Service: Plastics;  Laterality: Right;  . APPLICATION OF WOUND VAC N/A 05/09/2015   Procedure: APPLICATION OF WOUND VAC;  Surgeon: Rexene Alberts, MD;  Location: [place];  Service: Thoracic;  Laterality: N/A;  . APPLICATION OF WOUND VAC N/A 11/13/2015   Procedure: APPLICATION OF WOUND VAC;  Surgeon: Rexene Alberts, MD;  Location: [place];  Service: Thoracic;  Laterality: N/A;  . APPLICATION OF WOUND VAC N/A 11/21/2015   Procedure: APPLICATION OF WOUND VAC;  Surgeon: Loel Lofty Dillingham, DO;  Location: [place];  Service: Plastics;  Laterality: N/A;  . APPLICATION OF WOUND VAC Right 12/21/2015   Procedure: APPLICATION OF WOUND VAC to right chest wall ;  Surgeon: Loel Lofty Dillingham, DO;  Location: [place];  Service: Plastics;  Laterality: Right;  . APPLICATION OF WOUND VAC Right 01/11/2016   Procedure: APPLICATION OF WOUND VAC RIGHT CHEST ;  Surgeon: Loel Lofty Dillingham, DO;  Location: [place];  Service: Clinical cytogeneticist;  Laterality: Right;  . APPLICATION OF WOUND VAC Right 01/24/2016   Procedure: APPLICATION OF WOUND VAC RIGHT CHEST WALL;  Surgeon: Loel Lofty Dillingham, DO;  Location: [place];  Service: Plastics;  Laterality: Right;  . APPLICATION OF WOUND VAC Right 02/12/2016   Procedure: RE-APPLICATION OF WOUND VAC TO RIGHT CHEST WOUND;  Surgeon: Loel Lofty Dillingham, DO;  Location: [place];  Service: Plastics;  Laterality: Right;  . BREAST BIOPSY Left 11/25/06   neg  . BREAST BIOPSY Left 01/20/12   /clip-neg  . CARDIAC CATHETERIZATION  11/2013   [facility]  . CARDIAC CATHETERIZATION  10/2014   [facility]  . CARDIAC VALVE REPLACEMENT    . CARDIOVERSION N/A 07/20/2018   Procedure: CARDIOVERSION;  Surgeon: Wellington Hampshire, MD;  Location: [facility];  Service: Cardiovascular;  Laterality: N/A;  . CATARACT EXTRACTION W/ INTRAOCULAR LENS  IMPLANT,  BILATERAL Bilateral 2014  . CLIPPING OF ATRIAL APPENDAGE N/A 04/25/2015   Procedure: CLIPPING OF ATRIAL APPENDAGE;  Surgeon: Rexene Alberts, MD;  Location: [place];  Service: Open Heart Surgery;  Laterality: N/A;  . COCHLEAR IMPLANT Left 2005?  . COLONOSCOPY WITH PROPOFOL N/A 01/20/2018   Procedure: COLONOSCOPY WITH PROPOFOL;  Surgeon: Toledo, Benay Pike, MD;  Location: [facility];  Service: Gastroenterology;  Laterality: N/A;  . CORONARY ANGIOPLASTY    . CORONARY ARTERY BYPASS GRAFT N/A 04/25/2015   Procedure: CORONARY ARTERY BYPASS GRAFTING (CABG) x ONE, using left internal mammary artery;  Surgeon: Rexene Alberts, MD;  Location: [place];  Service: Open Heart Surgery;  Laterality: N/A;  . DILATION AND CURETTAGE OF UTERUS  "several before hysterectomy"  . EP IMPLANTABLE DEVICE N/A 05/02/2015   Procedure: Pacemaker Implant;  Surgeon: Thompson Grayer, MD;  Location: [place] CV LAB;  Service: Cardiovascular;  Laterality: N/A;  . ESOPHAGOGASTRODUODENOSCOPY (EGD) WITH PROPOFOL N/A 01/20/2018   Procedure: ESOPHAGOGASTRODUODENOSCOPY (EGD) WITH PROPOFOL;  Surgeon: Toledo, Benay Pike, MD;  Location: [facility];  Service: Gastroenterology;  Laterality: N/A;  . EYE SURGERY    . I&D EXTREMITY Right 12/06/2015   Procedure: IRRIGATION AND DEBRIDEMENT RIGHT CHEST WALL WITH ACELL PLACEMENT AND VAC;  Surgeon: Loel Lofty Dillingham, DO;  Location: [place];  Service: Plastics;  Laterality: Right;  . INCISION AND DRAINAGE OF WOUND N/A 11/21/2015   Procedure: IRRIGATION AND DEBRIDEMENT WOUND;  Surgeon: Loel Lofty Dillingham, DO;  Location: [place];  Service: Plastics;  Laterality: N/A;  . INCISION AND DRAINAGE OF WOUND Right 12/21/2015   Procedure: IRRIGATION AND DEBRIDEMENT right chest wall WOUND;  Surgeon: Loel Lofty Dillingham, DO;  Location: [place];  Service: Plastics;  Laterality: Right;  right chest wall   . INCISION AND DRAINAGE OF WOUND Right 01/24/2016   Procedure: IRRIGATION AND DEBRIDEMENT RIGHT CHEST WALL WOUND;   Surgeon: Loel Lofty Dillingham, DO;  Location: [place];  Service: Plastics;  Laterality: Right;  . INCISION AND DRAINAGE OF WOUND Right 03/28/2016   Procedure: IRRIGATION AND DEBRIDEMENT RIGHT CHEST WALL WOUND WITH A Cell Placement;  Surgeon: Loel Lofty Dillingham, DO;  Location: [place];  Service: Plastics;  Laterality: Right;  . INCISION AND DRAINAGE OF WOUND Right 05/17/2016   Procedure: IRRIGATION AND DEBRIDEMENT OF RIGHT CHEST WOUND WITH A CELL PLACEMENT;  Surgeon: Loel Lofty Dillingham, DO;  Location: [place];  Service: Plastics;  Laterality: Right;  . INSERT / REPLACE / REMOVE PACEMAKER    . IRRIGATION AND DEBRIDEMENT OF WOUND WITH SPLIT THICKNESS SKIN GRAFT Right 01/11/2016   Procedure: IRRIGATION AND DEBRIDEMENT OF  RIGHT CHEST WOUND ;  Surgeon: Loel Lofty Dillingham, DO;  Location: [place];  Service: Plastics;  Laterality: Right;  . MASTECTOMY, PARTIAL Right 2002   positive  . MAZE N/A 04/25/2015   Procedure: MAZE;  Surgeon: Rexene Alberts, MD;  Location: [place];  Service: Open Heart Surgery;  Laterality: N/A;  . MITRAL VALVE REPAIR N/A 04/25/2015   Procedure: MITRAL VALVE  REPLACEMENT using a 27 mm Edwards Perimount Magna Mitral Ease Valve;  Surgeon: Rexene Alberts, MD;  Location: [place];  Service: Open Heart Surgery;  Laterality: N/A;  . RIGHT HEART CATH N/A 02/08/2019   Procedure: RIGHT HEART CATH;  Surgeon: Wellington Hampshire, MD;  Location: [place] CV [facility];  Service: Cardiovascular;  Laterality: N/A;  . SKIN SPLIT GRAFT Right 02/12/2016   Procedure: IRRIGATION AND DEBRIDEMENT RIGHT CHEST WOUND;  Surgeon: Loel Lofty Dillingham, DO;  Location: [place];  Service: Plastics;  Laterality: Right;  . STERNAL WIRES REMOVAL N/A 06/05/2015   Procedure: STERNAL WIRES REMOVAL;  Surgeon: Rexene Alberts, MD;  Location: [place];  Service: Thoracic;  Laterality: N/A;  . STERNAL WOUND DEBRIDEMENT N/A 05/09/2015   Procedure: STERNAL WOUND DEBRIDEMENT;  Surgeon: Rexene Alberts, MD;  Location: [place];  Service: Thoracic;   Laterality: N/A;  . STERNAL WOUND DEBRIDEMENT N/A 11/13/2015   Procedure: Excisional drainage of RIGHT Chest wall mass and breast mass ;  Surgeon: Rexene Alberts, MD;  Location: [place];  Service: Thoracic;  Laterality: N/A;  . TEE WITHOUT CARDIOVERSION N/A 04/25/2015   Procedure: TRANSESOPHAGEAL ECHOCARDIOGRAM (TEE);  Surgeon: Rexene Alberts, MD;  Location: [place];  Service: Open Heart Surgery;  Laterality: N/A;  . TONSILLECTOMY    . TRAM Right 11/18/2015   Procedure: VRAM Vertical Rectus Abdominus Muscle Flap;  Surgeon: Loel Lofty Dillingham, DO;  Location: [place];  Service: Plastics;  Laterality: Right;  RIght Back  . TRICUSPID VALVE REPLACEMENT N/A 04/25/2015   Procedure: TRICUSPID VALVE REPAIR;  Surgeon: Rexene Alberts, MD;  Location: [place];  Service: Open Heart Surgery;  Laterality: N/A;  . VAGINAL HYSTERECTOMY       Current Meds  Medication Sig  . amiodarone (PACERONE) 200 MG tablet TAKE 1 TABLET BY MOUTH EVERY DAY  . aspirin EC 81 MG EC tablet Take 1 tablet (81 mg total) by mouth daily.  . bisoprolol (ZEBETA) 5 MG tablet Take 1 tablet (5 mg total) by mouth daily.  . diphenhydrAMINE (BENADRYL) 25 mg capsule Take 25 mg by mouth every 6 (six) hours as needed for allergies.  . ferrous sulfate 325 (65 FE) MG tablet Take 325 mg by mouth daily with breakfast.  . furosemide (LASIX) 20 MG tablet Take 20-40 mg by mouth 2 (two) times daily. Take 40mg  morning and 20mg  in the evening  . gabapentin (NEURONTIN) 100 MG capsule Take 200 mg by mouth at bedtime.   . hydrALAZINE (APRESOLINE) 10 MG tablet Take 0.5 tablets (5 mg total) by mouth every 8 (eight) hours.  Marland Kitchen ipratropium-albuterol (DUONEB) 0.5-2.5 (3) MG/3ML SOLN Take 3 mLs by nebulization every 6 (six) hours as needed. [id number]  . levothyroxine (SYNTHROID, LEVOTHROID) 88 MCG tablet Take 88 mcg by mouth daily before breakfast.  . Multiple Vitamins-Minerals (PRESERVISION AREDS 2 PO) Take 2 tablets by mouth daily.  . pantoprazole (PROTONIX) 40 MG tablet  Take 40 mg by mouth 2 (two) times daily.   . sacubitril-valsartan (ENTRESTO) 24-26 MG Take 1 tablet by mouth 2 (two) times daily.  . simvastatin (  ZOCOR) 10 MG tablet Take 1 tablet (10 mg total) by mouth at bedtime.  Marland Kitchen spironolactone (ALDACTONE) 25 MG tablet Take 0.5 tablets (12.5 mg total) by mouth daily.  Marland Kitchen warfarin (COUMADIN) 2.5 MG tablet TAKE AS DIRECTED BY COUMADIN CLINIC (Patient taking differently: Take 1.25-2.5 mg by mouth daily at 6 PM. Take one tablet (2.5 mg) by mouth on Tuesday, Thursday, Saturday and Sunday. Take one-half tablet (1.25 mg) on Monday, Wednesday and Friday)     Allergies:   Ace inhibitors, Amoxicillin-pot clavulanate, Penicillins, Nifedipine, Propranolol, Diazepam, Meperidine, and Propoxyphene   Social History   Tobacco Use  . Smoking status: Former Smoker    Packs/day: 1.50    Years: 40.00    Pack years: 60.00    Types: Cigarettes    Quit date: 04/20/1998    Years since quitting: 20.9  . Smokeless tobacco: Never Used  Substance Use Topics  . Alcohol use: Yes    Comment: 11/10/2015 "glass of wine a few times/year, if that"  . Drug use: No     Family Hx: The patient's family history includes Breast cancer in her maternal aunt and paternal aunt; CVA in her mother; Heart attack in her paternal uncle; Heart disease in her brother and father.  ROS:   Please see the history of present illness.     All other systems reviewed and are negative.   Prior CV studies:   The following studies were reviewed today:  Reviewed previous and recent echocardiograms.  Labs/Other Tests and Data Reviewed:    EKG:  No ECG reviewed.  Recent Labs: 10/26/2018: TSH 2.860 02/22/2019: B Natriuretic Peptide 806.0 02/26/2019: ALT 51; BUN 36; Creatinine, Ser 0.84; Hemoglobin 8.0; Magnesium 2.5; Platelets 186; Potassium 4.9; Sodium 141   Recent Lipid Panel Lab Results  Component Value Date/Time   TRIG 48 02/22/2019 11:00 AM    Wt Readings from Last 3 Encounters:  03/11/19  118 lb (53.5 kg)  02/26/19 127 lb 3.3 oz (57.7 kg)  02/22/19 121 lb (54.9 kg)     Objective:    Vital Signs:  BP 112/65 (BP Location: Left Arm, Patient Position: Sitting, Cuff Size: Normal)   Pulse 66   Ht 5' 1.5" (1.562 m)   Wt 118 lb (53.5 kg)   BMI 21.93 kg/m    VITAL SIGNS:  reviewed  ASSESSMENT & PLAN:     1.  Chronic systolic heart failure: Right heart catheterization last month confirmed significant volume overload with pulmonary hypertension.  Since then, she underwent IV diuresis and the dose of oral furosemide was increased.  She reports improvement.  We need to get a follow-up basic metabolic profile.  Given her anemia also will get a follow-up CBC. Continue treatment with Entresto, bisoprolol and spironolactone. She is coming back for a repeat echocardiogram later this month.  If ejection fraction remains below 40%, I agree with upgrading her pacemaker to CRT.  2. Status post mitral valve replacement with a bioprosthetic valve: This was intact on most recent echocardiogram.  3. Coronary artery disease status post one-vessel LIMA to LAD: No anginal symptoms  4. Persistent atrial fibrillation: She seems to be maintaining in sinus rhythm with amiodarone.  Continue long-term anticoagulation with warfarin.  5.  Hyperlipidemia: Currently on simvastatin.  6.  Status post permanent pacemaker placement: Followed by Dr. Caryl Comes.  7.  Recent COVID-19 infection: Her husband is asymptomatic and still on self quarantine.  The patient seems to have recovered from this with no significant residual symptoms.  COVID-19 Education: The signs and symptoms of COVID-19 were discussed with the patient and how to seek care for testing (follow up with PCP or arrange E-visit).  The importance of social distancing was discussed today.  Time:   Today, I have spent 12 minutes with the patient with telehealth technology discussing the above problems.     Medication Adjustments/Labs and Tests  Ordered: Current medicines are reviewed at length with the patient today.  Concerns regarding medicines are outlined above.   Tests Ordered: No orders of the defined types were placed in this encounter.   Medication Changes: No orders of the defined types were placed in this encounter.   Follow Up:  In Person in 1 month(s)  Signed, Kathlyn Sacramento, MD  03/11/2019 10:57 AM    [place]

## 2019-03-11 NOTE — Patient Instructions (Signed)
Medication Instructions:  Continue same medications If you need a refill on your cardiac medications before your next appointment, please call your pharmacy.   Lab work: CBC and basic metabolic profile If you have labs (blood work) drawn today and your tests are completely normal, you will receive your results only by: Marland Kitchen MyChart Message (if you have MyChart) OR . A paper copy in the mail If you have any lab test that is abnormal or we need to change your treatment, we will call you to review the results.  Testing/Procedures: Echo is scheduled later this month  Follow-Up: At [facility], you and your health needs are our priority.  As part of our continuing mission to provide you with exceptional heart care, we have created designated Provider Care Teams.  These Care Teams include your primary Cardiologist (physician) and Advanced Practice Providers (APPs -  Physician Assistants and Nurse Practitioners) who all work together to provide you with the care you need, when you need it. You will need a follow up appointment in 1 months.  Please call our office 2 months in advance to schedule this appointment.  You may see Kathlyn Sacramento, MD or one of the following Advanced Practice Providers on your designated Care Team:   Murray Hodgkins, NP Christell Faith, PA-C Marrianne Mood, PA-C

## 2019-03-11 NOTE — Telephone Encounter (Signed)

## 2019-03-15 ENCOUNTER — Telehealth: Payer: Self-pay | Admitting: Internal Medicine

## 2019-03-15 NOTE — Telephone Encounter (Signed)
hh nurse unable to get blood return for labs   Please call if someone else should try to [facility] patient (if service available )   Please note patient also has an appt in [place] with [facility] tomorrow

## 2019-03-16 ENCOUNTER — Ambulatory Visit (INDEPENDENT_AMBULATORY_CARE_PROVIDER_SITE_OTHER): Payer: Medicare Other | Admitting: Internal Medicine

## 2019-03-16 ENCOUNTER — Other Ambulatory Visit: Payer: Self-pay

## 2019-03-16 VITALS — BP 122/68 | HR 61 | Ht 61.5 in | Wt 118.0 lb

## 2019-03-16 DIAGNOSIS — I442 Atrioventricular block, complete: Secondary | ICD-10-CM | POA: Diagnosis not present

## 2019-03-16 DIAGNOSIS — I2510 Atherosclerotic heart disease of native coronary artery without angina pectoris: Secondary | ICD-10-CM

## 2019-03-16 DIAGNOSIS — I5042 Chronic combined systolic (congestive) and diastolic (congestive) heart failure: Secondary | ICD-10-CM

## 2019-03-16 DIAGNOSIS — I4819 Other persistent atrial fibrillation: Secondary | ICD-10-CM | POA: Diagnosis not present

## 2019-03-16 DIAGNOSIS — Z950 Presence of cardiac pacemaker: Secondary | ICD-10-CM

## 2019-03-16 DIAGNOSIS — Z79899 Other long term (current) drug therapy: Secondary | ICD-10-CM | POA: Diagnosis not present

## 2019-03-16 DIAGNOSIS — M7989 Other specified soft tissue disorders: Secondary | ICD-10-CM

## 2019-03-16 NOTE — Telephone Encounter (Signed)
CMET/ CBC/ TSH all drawn in clinic today.

## 2019-03-16 NOTE — Telephone Encounter (Signed)
Patient currently arrived at her appointment for today with Dr Caryl Comes. Secure chat message sent to Alvis Lemmings, RN concerning this to see if Dr Caryl Comes would like any labs.

## 2019-03-16 NOTE — Progress Notes (Signed)
See reply Patient Care Team: Ezequiel Kayser, MD as PCP - General (Internal Medicine) Wellington Hampshire, MD as PCP - Cardiology (Cardiology) Alisa Graff, FNP as Nurse Practitioner (Family Medicine)   HPI  [name] is a 84 y.o. female Seen in follow-up for a pacemaker implanted 8/16 in the context of multi-valvular surgery bioprosthetic mitral valve replacement, tricuspid valve repair and MAZE operation. She developed postoperative complete heart block [facility])  She has a history of atrial fibrillation she also developed a sternal wound infection requiring debridement.  She is anticoagulated with Coumadin and aspirin  DATE TEST EF   5/16 Echo   65 %   12/16 Echo   30-35 %   1/18 Echo  55%   11/19 Echo  20-25%   12/19 Echo  35%   Previous complaints of exercise intolerance improved with decreasing rate response slope and ADL rate.       11/19 she was noted to be in rapid atrial fibrillation.  She was admitted 11/19 and started on amiodarone with a marked improvement in symptoms  Hospitalized 6/20 with increasing shortness of breath with COVID positive.  Chest x-rays were unrevealing for pneumonia.  She has been maintained on amiodarone.  By device interrogation, no interval atrial fibrillation.  Over the last days she has had asymmetric left greater than right edema.  In the past she has had right greater than left edema.  She is on Coumadin last INR 02/26/2019 2.1 no history of DVT.  Date Cr K Hgb TSH LFTs  8/17     9.2    5/18 0.8  11.5    5/19 0.8 3.9 13.5 (MCV 103<<89)    11/19 0.8 3.9 9.9 2.168 43-  6/20 0.84 4.9 8.0 2.860 (2/20) 51            Hx of recurrent anemia and has had Fe replacement, but not sure if there is a specific diagnosis    Past Medical History:  Diagnosis Date   Anxiety    Arthritis    "hands" (11/10/2015)   CAD s/p CABG x 1    a. 04/2015 LIMA to LAD   Chronic combined systolic (congestive) and diastolic (congestive) heart failure ([place])     a. 01/2015 Echo: EF 50-55%, Gr2 DD (preop valve surgery); b. 08/2015 Echo: EF 30-35%, diff HK. Gr2 DD; c. 09/2016 Echo: EF 50-55%. Nl fxning TV ring. Mildly dil LA.   COPD (chronic obstructive pulmonary disease) (HCC)    Coronary artery disease    GERD (gastroesophageal reflux disease)    History of colon polyps    History of mitral valve replacement    a. 04/2015 s/p 27 mm [facility] Mitral bovine bioprosthetic tissue valve   Hyperlipidemia    Hypertension    Hypothyroidism    Maze operation for AF w/ LAA clipping    a. 04/2015 Complete bilateral atrial lesion set using cryothermy and bipolar radiofrequency ablation with clipping of LA appendage   Meniere disease    Meniere's disease    On home oxygen therapy    "2L at night" (11/10/2015)   PAF (paroxysmal atrial fibrillation) ([place])    Pneumonia ~ 2010   PONV (postoperative nausea and vomiting)    Pt felt like eardrums were gonna pop   Post-surgical complete heart block, symptomatic    a. 04/2015 s/p MDT [id number] [place] DR MRI DC PPM.   Presence of permanent cardiac pacemaker    Pulmonary hypertension ([place])  Surgical wound, non healing - chest wall 11/10/2015   Tricuspid Regurgitation s/p Repair    a. 04/2015 s/p 26 mm Edwards mc3 ring annuloplasty   Wound infection 10/13/2015   Superficial sternal wound infection    Past Surgical History:  Procedure Laterality Date   APPENDECTOMY     APPLICATION OF A-CELL OF CHEST/ABDOMEN N/A 11/21/2015   Procedure: APPLICATION OF A-CELL OF CHEST/ABDOMEN;  Surgeon: Loel Lofty Dillingham, DO;  Location: [place];  Service: Plastics;  Laterality: N/A;   APPLICATION OF A-CELL OF CHEST/ABDOMEN Right 12/21/2015   Procedure: APPLICATION OF A-CELL OF RIGHT CHEST;  Surgeon: Loel Lofty Dillingham, DO;  Location: [place];  Service: Plastics;  Laterality: Right;   APPLICATION OF A-CELL OF CHEST/ABDOMEN Right 01/11/2016   Procedure: APPLICATION OF A-CELL OF RIGHT CHEST;  Surgeon: Loel Lofty  Dillingham, DO;  Location: [place];  Service: Plastics;  Laterality: Right;   APPLICATION OF A-CELL OF CHEST/ABDOMEN Right 02/12/2016   Procedure: APPLICATION OF A-CELL TO RIGHT CHEST WOUND;  Surgeon: Loel Lofty Dillingham, DO;  Location: [place];  Service: Plastics;  Laterality: Right;   APPLICATION OF WOUND VAC N/A 05/09/2015   Procedure: APPLICATION OF WOUND VAC;  Surgeon: Rexene Alberts, MD;  Location: [place];  Service: Thoracic;  Laterality: N/A;   APPLICATION OF WOUND VAC N/A 11/13/2015   Procedure: APPLICATION OF WOUND VAC;  Surgeon: Rexene Alberts, MD;  Location: [place];  Service: Thoracic;  Laterality: N/A;   APPLICATION OF WOUND VAC N/A 11/21/2015   Procedure: APPLICATION OF WOUND VAC;  Surgeon: Loel Lofty Dillingham, DO;  Location: [place];  Service: Plastics;  Laterality: N/A;   APPLICATION OF WOUND VAC Right 12/21/2015   Procedure: APPLICATION OF WOUND VAC to right chest wall ;  Surgeon: Loel Lofty Dillingham, DO;  Location: [place];  Service: Plastics;  Laterality: Right;   APPLICATION OF WOUND VAC Right 01/11/2016   Procedure: APPLICATION OF WOUND VAC RIGHT CHEST ;  Surgeon: Loel Lofty Dillingham, DO;  Location: [place];  Service: Plastics;  Laterality: Right;   APPLICATION OF WOUND VAC Right 01/24/2016   Procedure: APPLICATION OF WOUND VAC RIGHT CHEST WALL;  Surgeon: Loel Lofty Dillingham, DO;  Location: [name];  Service: Plastics;  Laterality: Right;   APPLICATION OF WOUND VAC Right 02/12/2016   Procedure: RE-APPLICATION OF WOUND VAC TO RIGHT CHEST WOUND;  Surgeon: Loel Lofty Dillingham, DO;  Location: [place];  Service: Plastics;  Laterality: Right;   BREAST BIOPSY Left 11/25/06   neg   BREAST BIOPSY Left 01/20/12   /clip-neg   CARDIAC [facility]HETERIZATION  11/2013   [facility]   CARDIAC CATHETERIZATION  10/2014   [facility]   CARDIAC VALVE REPLACEMENT     CARDIOVERSION N/A 07/20/2018   Procedure: CARDIOVERSION;  Surgeon: Wellington Hampshire, MD;  Location: [facility];  Service: Cardiovascular;  Laterality: N/A;    CATARACT EXTRACTION W/ INTRAOCULAR LENS  IMPLANT, BILATERAL Bilateral 2014   CLIPPING OF ATRIAL APPENDAGE N/A 04/25/2015   Procedure: CLIPPING OF ATRIAL APPENDAGE;  Surgeon: Rexene Alberts, MD;  Location: [place];  Service: Open Heart Surgery;  Laterality: N/A;   COCHLEAR IMPLANT Left 2005?   COLONOSCOPY WITH PROPOFOL N/A 01/20/2018   Procedure: COLONOSCOPY WITH PROPOFOL;  Surgeon: Toledo, Benay Pike, MD;  Location: [facility];  Service: Gastroenterology;  Laterality: N/A;   CORONARY ANGIOPLASTY     CORONARY ARTERY BYPASS GRAFT N/A 04/25/2015   Procedure: CORONARY ARTERY BYPASS GRAFTING (CABG) x ONE, using left internal mammary artery;  Surgeon: Rexene Alberts, MD;  Location: [place];  Service: Open Heart Surgery;  Laterality: N/A;   DILATION AND CURETTAGE OF UTERUS  "several before hysterectomy"   EP IMPLANTABLE DEVICE N/A 05/02/2015   Procedure: Pacemaker Implant;  Surgeon: Thompson Grayer, MD;  Location: [place] [facility];  Service: Cardiovascular;  Laterality: N/A;   ESOPHAGOGASTRODUODENOSCOPY (EGD) WITH PROPOFOL N/A 01/20/2018   Procedure: ESOPHAGOGASTRODUODENOSCOPY (EGD) WITH PROPOFOL;  Surgeon: Toledo, Benay Pike, MD;  Location: [facility];  Service: Gastroenterology;  Laterality: N/A;   EYE SURGERY     I&D EXTREMITY Right 12/06/2015   Procedure: IRRIGATION AND DEBRIDEMENT RIGHT CHEST WALL WITH ACELL PLACEMENT AND VAC;  Surgeon: Loel Lofty Dillingham, DO;  Location: [place];  Service: Plastics;  Laterality: Right;   INCISION AND DRAINAGE OF WOUND N/A 11/21/2015   Procedure: IRRIGATION AND DEBRIDEMENT WOUND;  Surgeon: Loel Lofty Dillingham, DO;  Location: [place];  Service: Plastics;  Laterality: N/A;   INCISION AND DRAINAGE OF WOUND Right 12/21/2015   Procedure: IRRIGATION AND DEBRIDEMENT right chest wall WOUND;  Surgeon: Loel Lofty Dillingham, DO;  Location: [place];  Service: Plastics;  Laterality: Right;  right chest wall    INCISION AND DRAINAGE OF WOUND Right 01/24/2016   Procedure:  IRRIGATION AND DEBRIDEMENT RIGHT CHEST WALL WOUND;  Surgeon: Loel Lofty Dillingham, DO;  Location: [place];  Service: Plastics;  Laterality: Right;   INCISION AND DRAINAGE OF WOUND Right 03/28/2016   Procedure: IRRIGATION AND DEBRIDEMENT RIGHT CHEST WALL WOUND WITH A Cell Placement;  Surgeon: Loel Lofty Dillingham, DO;  Location: [place];  Service: Plastics;  Laterality: Right;   INCISION AND DRAINAGE OF WOUND Right 05/17/2016   Procedure: IRRIGATION AND DEBRIDEMENT OF RIGHT CHEST WOUND WITH A CELL PLACEMENT;  Surgeon: Loel Lofty Dillingham, DO;  Location: [place];  Service: Plastics;  Laterality: Right;   INSERT / REPLACE / REMOVE PACEMAKER     IRRIGATION AND DEBRIDEMENT OF WOUND WITH SPLIT THICKNESS SKIN GRAFT Right 01/11/2016   Procedure: IRRIGATION AND DEBRIDEMENT OF RIGHT CHEST WOUND ;  Surgeon: Loel Lofty Dillingham, DO;  Location: [place];  Service: Plastics;  Laterality: Right;   MASTECTOMY, PARTIAL Right 2002   positive   MAZE N/A 04/25/2015   Procedure: MAZE;  Surgeon: Rexene Alberts, MD;  Location: [place];  Service: Open Heart Surgery;  Laterality: N/A;   MITRAL VALVE REPAIR N/A 04/25/2015   Procedure: MITRAL VALVE  REPLACEMENT using a 27 mm Edwards Perimount Magna Mitral Ease Valve;  Surgeon: Rexene Alberts, MD;  Location: [place];  Service: Open Heart Surgery;  Laterality: N/A;   RIGHT HEART CATH N/A 02/08/2019   Procedure: RIGHT HEART CATH;  Surgeon: Wellington Hampshire, MD;  Location: [place] [facility];  Service: Cardiovascular;  Laterality: N/A;   SKIN SPLIT GRAFT Right 02/12/2016   Procedure: IRRIGATION AND DEBRIDEMENT RIGHT CHEST WOUND;  Surgeon: Loel Lofty Dillingham, DO;  Location: [place];  Service: Plastics;  Laterality: Right;   STERNAL WIRES REMOVAL N/A 06/05/2015   Procedure: STERNAL WIRES REMOVAL;  Surgeon: Rexene Alberts, MD;  Location: [place];  Service: Thoracic;  Laterality: N/A;   STERNAL WOUND DEBRIDEMENT N/A 05/09/2015   Procedure: STERNAL WOUND DEBRIDEMENT;  Surgeon: Rexene Alberts,  MD;  Location: [place];  Service: Thoracic;  Laterality: N/A;   STERNAL WOUND DEBRIDEMENT N/A 11/13/2015   Procedure: Excisional drainage of RIGHT Chest wall mass and breast mass ;  Surgeon: Rexene Alberts, MD;  Location: [place];  Service:  Thoracic;  Laterality: N/A;   TEE WITHOUT CARDIOVERSION N/A 04/25/2015   Procedure: TRANSESOPHAGEAL ECHOCARDIOGRAM (TEE);  Surgeon: Rexene Alberts, MD;  Location: [place];  Service: Open Heart Surgery;  Laterality: N/A;   TONSILLECTOMY     TRAM Right 11/18/2015   Procedure: VRAM Vertical Rectus Abdominus Muscle Flap;  Surgeon: Loel Lofty Dillingham, DO;  Location: [place];  Service: Plastics;  Laterality: Right;  RIght Back   TRICUSPID VALVE REPLACEMENT N/A 04/25/2015   Procedure: TRICUSPID VALVE REPAIR;  Surgeon: Rexene Alberts, MD;  Location: [place];  Service: Open Heart Surgery;  Laterality: N/A;   VAGINAL HYSTERECTOMY      Current Outpatient Medications  Medication Sig Dispense Refill   AMBULATORY NON FORMULARY MEDICATION Inogen at Home Oxygen machine with tubing. DX: COPD DX Code: J44.9 1 each 0   amiodarone (PACERONE) 200 MG tablet TAKE 1 TABLET BY MOUTH EVERY DAY 90 tablet 2   aspirin EC 81 MG EC tablet Take 1 tablet (81 mg total) by mouth daily.     bisoprolol (ZEBETA) 5 MG tablet Take 1 tablet (5 mg total) by mouth daily. 90 tablet 3   diphenhydrAMINE (BENADRYL) 25 mg capsule Take 25 mg by mouth every 6 (six) hours as needed for allergies.     ferrous sulfate 325 (65 FE) MG tablet Take 325 mg by mouth daily with breakfast.     furosemide (LASIX) 20 MG tablet Take 20-40 mg by mouth 2 (two) times daily. Take 40mg  morning and 20mg  in the evening     gabapentin (NEURONTIN) 100 MG capsule Take 200 mg by mouth at bedtime.   1   hydrALAZINE (APRESOLINE) 10 MG tablet Take 0.5 tablets (5 mg total) by mouth every 8 (eight) hours. 45 tablet 3   ipratropium-albuterol (DUONEB) 0.5-2.5 (3) MG/3ML SOLN Take 3 mLs by nebulization every 6 (six) hours as needed.  DX:J44.9 1080 mL 1   levothyroxine (SYNTHROID, LEVOTHROID) 88 MCG tablet Take 88 mcg by mouth daily before breakfast.     Multiple Vitamins-Minerals (PRESERVISION AREDS 2 PO) Take 2 tablets by mouth daily.     pantoprazole (PROTONIX) 40 MG tablet Take 40 mg by mouth 2 (two) times daily.      sacubitril-valsartan (ENTRESTO) 24-26 MG Take 1 tablet by mouth 2 (two) times daily. 60 tablet 6   warfarin (COUMADIN) 2.5 MG tablet TAKE AS DIRECTED BY COUMADIN CLINIC (Patient taking differently: Take 1.25-2.5 mg by mouth daily at 6 PM. Take one tablet (2.5 mg) by mouth on Tuesday, Thursday, Saturday and Sunday. Take one-half tablet (1.25 mg) on Monday, Wednesday and Friday) 165 tablet 0   simvastatin (ZOCOR) 10 MG tablet Take 1 tablet (10 mg total) by mouth at bedtime. 90 tablet 3   spironolactone (ALDACTONE) 25 MG tablet Take 0.5 tablets (12.5 mg total) by mouth daily. 45 tablet 3   No current facility-administered medications for this visit.     Allergies  Allergen Reactions   Ace Inhibitors Cough and Other (See Comments)   Amoxicillin-Pot Clavulanate Swelling and Other (See Comments)    JOINTS, PAIN Joint pain    Penicillins Swelling and Other (See Comments)    SWOLLEN JOINTS  Has patient had a PCN reaction causing immediate rash, facial/tongue/throat swelling, SOB or lightheadedness with hypotension: Yes Has patient had a PCN reaction causing severe rash involving mucus membranes or skin necrosis: No Has patient had a PCN reaction that required hospitalization No Has patient had a PCN reaction occurring within the last 10 years:  No If all of the above answers are "NO", then may proceed with Cephalosporin use.  Swollen joints   Nifedipine Other (See Comments)    UNSPECIFIED UNSPECIFIED    Propranolol Other (See Comments)    UNSPECIFIED UNSPECIFIED    Diazepam Other (See Comments)    Made her "hyper"    Meperidine Other (See Comments)    Made her "hyper"      Propoxyphene Other (See Comments)    Made her "hyper"        Review of Systems negative except from HPI and PMH  Physical Exam BP 122/68 (BP Location: Left Arm, Patient Position: Sitting, Cuff Size: Normal)    Pulse 61    Ht 5' 1.5" (1.562 m)    Wt 118 lb (53.5 kg)    BMI 21.93 kg/m   Well developed and well nourished in no acute distress HENT normal Neck supple with JVP-flat Clear Device pocket well healed; without hematoma or erythema.  There is no tethering  Regular rate and rhythm, no gallop No murmur Abd-soft with active BS No Clubbing cyanosis L>R to mid shin edema Skin-warm and dry A & Oriented  Grossly normal sensory and motor function  ECG AV pacing   Assessment and  Plan  Atrial fibrillation-persistent status post maze operation  Amiodarone   Complete heart block -Intermittent  Sinus node dysfunction   First-degree AV block profound  Status post mitral valve replacement-bioprosthetic with tricuspid valve repair  Pacemaker-[employer]  Dypsnea on Exertion   Cardiomyopathy   Anemia   Continues to be anemic.  I have asked her to follow-up with her PCP later this week as I think a specific diagnosis is important.  Ferritin levels have been normal.  Asymmetric edema now left greater than right.  Not withstanding the fact that she is on Coumadin, with her having been hospitalized, will check a venous Doppler.  If a clot is found, she might will need a filter.  Last INR was therapeutic.  She is scheduled for repeat echo; will look at LV function and consider CRT upgrade.  Now oxygen dependent with her COPD.  Needs follow-up with her pulmonary  Device function normal   We spent more than 50% of our >25 min visit in face to face counseling regarding the above

## 2019-03-16 NOTE — Patient Instructions (Addendum)
Medication Instructions:  - Your physician recommends that you continue on your current medications as directed. Please refer to the Current Medication list given to you today.  If you need a refill on your cardiac medications before your next appointment, please call your pharmacy.   Lab work: - Your physician recommends that you have lab work today: CMET/ TSH/ CBC  If you have labs (blood work) drawn today and your tests are completely normal, you will receive your results only by: Marland Kitchen MyChart Message (if you have MyChart) OR . A paper copy in the mail If you have any lab test that is abnormal or we need to change your treatment, we will call you to review the results.  Testing/Procedures: -  Echocardiogram as scheduled on Thursday 04/01/19 @ 2:00 pm  - Your physician has requested that you have a lower extremity venous duplex- Wednesday 03/17/2019 at 4:00 pm. This test is an ultrasound of the veins in the legs. It looks at venous blood flow that carries blood from the heart to the legs. Allow one hour for a Lower Venous exam. There are no restrictions or special instructions.  - Coumadin check with [facility]- Wednesday 03/17/2019 at 3:30 pm  Follow-Up: At [facility], you and your health needs are our priority.  As part of our continuing mission to provide you with exceptional heart care, we have created designated Provider Care Teams.  These Care Teams include your primary Cardiologist (physician) and Advanced Practice Providers (APPs -  Physician Assistants and Nurse Practitioners) who all work together to provide you with the care you need, when you need it.  - Your physician wants you to follow-up in: 6 months with Dr. Caryl Comes (January 2021) - Please call the office 2 months in advance to schedule (call in early November 2020)  Any Other Special Instructions Will Be Listed Below (If Applicable). - N/A

## 2019-03-17 ENCOUNTER — Ambulatory Visit (INDEPENDENT_AMBULATORY_CARE_PROVIDER_SITE_OTHER): Payer: Medicare Other

## 2019-03-17 ENCOUNTER — Telehealth: Payer: Self-pay

## 2019-03-17 DIAGNOSIS — Z5181 Encounter for therapeutic drug level monitoring: Secondary | ICD-10-CM | POA: Diagnosis not present

## 2019-03-17 DIAGNOSIS — M7989 Other specified soft tissue disorders: Secondary | ICD-10-CM

## 2019-03-17 DIAGNOSIS — I340 Nonrheumatic mitral (valve) insufficiency: Secondary | ICD-10-CM | POA: Diagnosis not present

## 2019-03-17 DIAGNOSIS — Z953 Presence of xenogenic heart valve: Secondary | ICD-10-CM

## 2019-03-17 LAB — COMPREHENSIVE METABOLIC PANEL
ALT: 16 IU/L (ref 0–32)
AST: 13 IU/L (ref 0–40)
Albumin/Globulin Ratio: 1.7 (ref 1.2–2.2)
Albumin: 3.8 g/dL (ref 3.6–4.6)
Alkaline Phosphatase: 81 IU/L (ref 39–117)
BUN/Creatinine Ratio: 26 (ref 12–28)
BUN: 25 mg/dL (ref 8–27)
Bilirubin Total: 0.3 mg/dL (ref 0.0–1.2)
CO2: 29 mmol/L (ref 20–29)
Calcium: 8.9 mg/dL (ref 8.7–10.3)
Chloride: 101 mmol/L (ref 96–106)
Creatinine, Ser: 0.97 mg/dL (ref 0.57–1.00)
GFR calc Af Amer: 63 mL/min/{1.73_m2} (ref >59)
GFR calc non Af Amer: 55 mL/min/{1.73_m2} — ABNORMAL LOW (ref >59)
Globulin, Total: 2.2 g/dL (ref 1.5–4.5)
Glucose: 80 mg/dL (ref 65–99)
Potassium: 4.8 mmol/L (ref 3.5–5.2)
Sodium: 146 mmol/L — ABNORMAL HIGH (ref 134–144)
Total Protein: 6 g/dL (ref 6.0–8.5)

## 2019-03-17 LAB — CBC WITH DIFFERENTIAL/PLATELET
Basophils Absolute: 0 10*3/uL (ref 0.0–0.2)
Basos: 0 %
EOS (ABSOLUTE): 0 10*3/uL (ref 0.0–0.4)
Eos: 0 %
Hematocrit: 21 % — ABNORMAL LOW (ref 34.0–46.6)
Hemoglobin: 7.2 g/dL — ABNORMAL LOW (ref 11.1–15.9)
Immature Grans (Abs): 0 10*3/uL (ref 0.0–0.1)
Immature Granulocytes: 0 %
Lymphocytes Absolute: 0.6 10*3/uL — ABNORMAL LOW (ref 0.7–3.1)
Lymphs: 9 %
MCH: 35.5 pg — ABNORMAL HIGH (ref 26.6–33.0)
MCHC: 34.3 g/dL (ref 31.5–35.7)
MCV: 103 fL — ABNORMAL HIGH (ref 79–97)
Monocytes Absolute: 0.7 10*3/uL (ref 0.1–0.9)
Monocytes: 10 %
Neutrophils Absolute: 5.3 10*3/uL (ref 1.4–7.0)
Neutrophils: 81 %
Platelets: 207 10*3/uL (ref 150–450)
RBC: 2.03 x10E6/uL — CL (ref 3.77–5.28)
RDW: 15.7 % — ABNORMAL HIGH (ref 11.7–15.4)
WBC: 6.6 10*3/uL (ref 3.4–10.8)

## 2019-03-17 LAB — POCT INR: INR: 6.8 — AB (ref 2.0–3.0)

## 2019-03-17 LAB — TSH: TSH: 1.36 u[IU]/mL (ref 0.450–4.500)

## 2019-03-17 NOTE — Telephone Encounter (Signed)
-----   Message from Wellington Hampshire, MD sent at 03/17/2019  3:16 PM EDT ----- Inform patient that labs showed worsening anemia. Forward to PCP and ask the patient to address with him. A referral to hematology (cancer center) is likely needed to assist with transfusion.

## 2019-03-17 NOTE — Telephone Encounter (Signed)
Attempted to contact the pt with results and Dr.Arida's recommendation. lmtcb on pt home phone. Unable to lmom on the pt cell voicemail is not set up. Results fwd to pcp.

## 2019-03-17 NOTE — Patient Instructions (Signed)
Please skip coumadin tonight thru Saturday, on Sunday, resume dosage of 1 tablet every day EXCEPT 1.5 TABLETS ON MONDAYS, [place]. Recheck in 1 week.

## 2019-03-18 ENCOUNTER — Encounter: Payer: Self-pay | Admitting: Cardiology

## 2019-03-18 NOTE — Telephone Encounter (Signed)
Patient spouse returning call for results

## 2019-03-18 NOTE — Progress Notes (Signed)
Remote pacemaker transmission.   

## 2019-03-18 NOTE — Telephone Encounter (Signed)
Spoke with the pt. Pt mad aware of lab results with verbalized understanding. Pt sts that she has an appt with her pcp today @ 9:30am. Adv the pt that I have fwd a copy of the results to her pcp for review.

## 2019-03-24 ENCOUNTER — Ambulatory Visit (INDEPENDENT_AMBULATORY_CARE_PROVIDER_SITE_OTHER): Payer: Medicare Other

## 2019-03-24 ENCOUNTER — Other Ambulatory Visit: Payer: Self-pay

## 2019-03-24 DIAGNOSIS — Z5181 Encounter for therapeutic drug level monitoring: Secondary | ICD-10-CM | POA: Diagnosis not present

## 2019-03-24 DIAGNOSIS — I340 Nonrheumatic mitral (valve) insufficiency: Secondary | ICD-10-CM | POA: Diagnosis not present

## 2019-03-24 DIAGNOSIS — Z953 Presence of xenogenic heart valve: Secondary | ICD-10-CM

## 2019-03-24 LAB — POCT INR: INR: 1.3 — AB (ref 2.0–3.0)

## 2019-03-24 NOTE — Patient Instructions (Signed)
Please take 2 tablets tonight, 1.5 tablets tomorrow, then resume dosage of 1 tablet every day EXCEPT 1.5 TABLETS ON MONDAYS, [place]. Recheck in 2 weeks.

## 2019-03-31 ENCOUNTER — Telehealth: Payer: Self-pay | Admitting: Cardiovascular Disease

## 2019-03-31 NOTE — Telephone Encounter (Signed)

## 2019-04-01 ENCOUNTER — Other Ambulatory Visit: Payer: Self-pay

## 2019-04-01 ENCOUNTER — Ambulatory Visit (INDEPENDENT_AMBULATORY_CARE_PROVIDER_SITE_OTHER): Payer: Medicare Other

## 2019-04-01 DIAGNOSIS — I4819 Other persistent atrial fibrillation: Secondary | ICD-10-CM | POA: Diagnosis not present

## 2019-04-06 ENCOUNTER — Telehealth: Payer: Self-pay | Admitting: Internal Medicine

## 2019-04-06 NOTE — Telephone Encounter (Signed)
Notes recorded by Deboraha Sprang, MD on 04/02/2019 at 4:10 PM EDT  Please Inform Patient Echo showed  Stable  heart muscle function  Prior to thinking aobut a device revision , her anemia issue needs to be addressed and resolved   [name]  Thanks SK

## 2019-04-06 NOTE — Telephone Encounter (Signed)
I spoke with the patient & her husband regarding her echo results.  The patient asked that I give details to her husband due to her hearing.  The patient's husband advised that she saw her PCP about 2 weeks ago for her anemia.  He was going to see her again in a month. He has not referred her to hematology. I advised the patient's husband this would be the next most appropriate step. He is aware we will hold off on anything further from Dr. Caryl Comes and let her follow with her PCP/ general cards.  She is due to see Christell Faith, PA on 04/13/19.  The patient's husband is agreeable with the above.

## 2019-04-07 ENCOUNTER — Ambulatory Visit (INDEPENDENT_AMBULATORY_CARE_PROVIDER_SITE_OTHER): Payer: Medicare Other

## 2019-04-07 ENCOUNTER — Other Ambulatory Visit: Payer: Self-pay

## 2019-04-07 DIAGNOSIS — Z953 Presence of xenogenic heart valve: Secondary | ICD-10-CM | POA: Diagnosis not present

## 2019-04-07 DIAGNOSIS — I340 Nonrheumatic mitral (valve) insufficiency: Secondary | ICD-10-CM

## 2019-04-07 DIAGNOSIS — Z5181 Encounter for therapeutic drug level monitoring: Secondary | ICD-10-CM

## 2019-04-07 LAB — POCT INR: INR: 4.8 — AB (ref 2.0–3.0)

## 2019-04-07 NOTE — Patient Instructions (Signed)
Please skip coumadin tonight & tomorrow, then resume dosage of 1 tablet every day EXCEPT 1.5 TABLETS ON MONDAYS, [place]. Recheck on 8/10 at your appt w/ Dr. [place] Garnet.

## 2019-04-12 NOTE — Progress Notes (Signed)
Cardiology Office Note    Date:  04/13/2019   ID:  [name], DOB 03/18/1935, MRN [id number]  PCP:  Ezequiel Kayser, MD  Cardiologist:  Kathlyn Sacramento, MD  Electrophysiologist:  Virl Axe, MD   Chief Complaint: Follow up  History of Present Illness:   [name] is a 84 y.o. female with history of CAD s/p CABG x 1 with a LIMA to LAD in 04/2015, PAF onCoumadin, mitral valve stenosis and tricuspid regurgitation s/p mitral valve replacement, tricuspid valve replacement, MAZE procedure,andleft atrial appendage clipping in 04/9210 complicated by sternal wound infection and complete heart block requiring MDT PPM implantation, chronic combined CHF, chronic respiratory failure on supplemental oxygen, COPD, pulmonary hypertension,recent COVID-19 infection in 02/2019 requiring admission to the [facility], anemia,Meniere's disease, HTN, HLD,andhypothyroidismwho presents for follow up.   She was diagnosed with Afiband acute systolic CHFin 05/4173 with an initial EF of 35-40%. Cardiac cath revealed moderate proximal LAD disease with TEE revealing moderate tricuspid regurgitation, PAH, and moderate to severe MR. She underwent DCCV at that time. Follow up echo showed persistent severe MR despite improvement in LV function. She was subsequently referred to thoracic surgery and underwent mitral valve replacement with a bovine valve, tricuspid valve repair, MAZE procedure, left atrial appendage clipping, and CABG x 1 with a LIMA to LAD.Postoperative course was complicatedby sternal wound infection and complete heart block requiring [employer] permanent pacemaker. She was subsequently discharged on amiodarone and Coumadin in the setting of PAFand amiodarone was eventually discontinued. She has been on chronic Coumadin since. In 08/2017, she was seen with complaints of increasing lowerextremityswelling and abdominal girth. Follow-up echo again showed LV dysfunction with an EF of 30 to 35%.She  was seen by EP and it was felt that LV dysfunction was in the context of RV apical pacing. That was inactivated and she noted immediate improvement in symptoms.Follow-up echocardiogram in1/2018 showed improvement in LV function with an EF of 50 to 55% and normal functioning valves.She was seen by EP in 05/2018 noting some exercise intolerance with symptoms improving following adjustments in her PPM. However, she was seen on 07/17/18 in the office noting a 3 week history of DOE and fatigue. She noted some tachy-palpitations and ankle edema. Echo performed that day showed an EF of 20-25%, mild concentric LVH, normal functioning prosthetic mitral valve, mildly dilated LA, moderately dilated RV with moderately reduced RVSF. She was noted to be in Afib with RVR with ventricular rates in the 120s bpm. She was sent to the ED and subsequently admitted, diuresed, and rate controlled. She was loaded with IV amiodarone and underwent successful DCCV on 07/20/18 to an AV paced rhythm.She was seen in hospital follow-up on 08/03/2018 and was doing very well. She reported she had not felt well for many months. There were no symptoms concerning for recurrence of A. fib. She underwent repeat echo on 08/24/2017 that showed an EF of 35 to 40%, diffuse hypokinesis, mild concentric LVH, bioprosthetic mitral valve was present and functioning normally with a mean gradient of 4 mmHg, left atrium is mildly dilated, right ventricular is mildly dilated with mildly reduced RV systolic function, PASP could not be accurately estimated, trivial pericardial effusion was noted. Compared to echo from 1 month prior EF had improved from 20 to 25% to 35 to 40%. In follow up in 09/2018, she continued to feel well. She underwent repeat echo in 10/2018 per EP which showed an EF of 35-40%, possible septal wall HK, mildly reduced RVSF with  mildly enlarged RV cavity, moderate biatrial enlargement. In follow up with EP afterwards, repeat echo was  recommended in 3 months with plans for CRT if her EF does not improve. She was seen in 01/2019 with continued dyspnea with RHC showing moderately elevated filling pressures, severe pulmonary hypertension and mildly reduced cardiac output.  There was no evidence of left-to-right intracardiac shunting by saturation run.  Prominent V waves were noted on pulmonary capillary wedge pressure tracing suggestive of significant mitral regurgitation. In this setting, her Lasix was titrated with recommendation for follow up echo in several months and if EF remained low to upgrade pacemaker to CRT as previously discussed by EP. Following this, she developed worsening dyspnea and was found to be COVID positive. She was admitted to [facility] in 02/2019 with COVID-19 infection. She was diuresed and hydralazine was added for afterload reduction. She was treated with steroids for pneumonitis. Her HGB trended down during her admission, though she idd not require transfusion. She was seen virtually by Dr. Fletcher Anon on 7/2 and was doing reasonably well with improvement in SOB and continued leg edema despite her weight being down. More recently, she followed up with EP on 03/16/2019. She noted asymmetric left greater than right lower extremity edema with lower extremity ultrasound showing a chronic left lower extremity DVT without change in management given her was already on [place]. Follow up echo on 04/01/2019 showed an EF of 35-40%, diffuse LV HK, moderately reduced RVSF with mildly enlarged RV cavity, RVSP 34.6 mmHg, mildly dilated LA, bioprosthetic mitral valve was normal, trivial pericardial effusion trivial AI, normal size and structure aorta. EP recommended the patient have her anemia further evaluated prior to considering device upgrade.  She has since followed up with her PCP for further evaluation of her anemia with most recent hemoglobin being 6.8 as outlined below with Hemoccult negative x2, normal ferritin, and mildly  elevated absolute reticulocyte count.  She comes in accompanied by her husband today.  She is doing very well from a cardiac perspective.  No chest pain, stable shortness of breath, no dizziness, presyncope, or syncope.  Much improved left lower extremity swelling.  No right lower extremity swelling.  No orthopnea, PND, early satiety.  She continues to try and avoid salt as much as possible.  She is compliant with all medications.  She denies any falls since she was last seen.  No BRBPR or melena.  Her stools are darker though she is taking iron supplementation.  She has follow-up appointment with her PCP next week with regards to her underlying anemia.  Patient's husband plans to ask PCP for referral to hematology.  Labs: 04/07/2019 - INR 4.8 (last subtherapeutic INR 03/24/2019 of 1.3) 03/2019 - WBC 6.5, HGB 6.8 with baseline around 8-9, PLT 193, ferritin 46 Hemoccult negative x2, A1c 4.6, TSH normal, SCr 0.97, K+ 4.8, albumin 3.8, AST/ALT normal  Past Medical History:  Diagnosis Date   Anxiety    Arthritis    "hands" (11/10/2015)   CAD s/p CABG x 1    a. 04/2015 LIMA to LAD   Chronic combined systolic (congestive) and diastolic (congestive) heart failure ([place])    a. 01/2015 Echo: EF 50-55%, Gr2 DD (preop valve surgery); b. 08/2015 Echo: EF 30-35%, diff HK. Gr2 DD; c. 09/2016 Echo: EF 50-55%. Nl fxning TV ring. Mildly dil LA.   COPD (chronic obstructive pulmonary disease) (HCC)    Coronary artery disease    GERD (gastroesophageal reflux disease)    History of colon  polyps    History of mitral valve replacement    a. 04/2015 s/p 27 mm [facility] Mitral bovine bioprosthetic tissue valve   Hyperlipidemia    Hypertension    Hypothyroidism    Maze operation for AF w/ LAA clipping    a. 04/2015 Complete bilateral atrial lesion set using cryothermy and bipolar radiofrequency ablation with clipping of LA appendage   Meniere disease    Meniere's disease    On home oxygen therapy     "2L at night" (11/10/2015)   PAF (paroxysmal atrial fibrillation) ([place])    Pneumonia ~ 2010   PONV (postoperative nausea and vomiting)    Pt felt like eardrums were gonna pop   Post-surgical complete heart block, symptomatic    a. 04/2015 s/p MDT [id number] Advisa DR MRI DC PPM.   Presence of permanent cardiac pacemaker    Pulmonary hypertension ([place])    Surgical wound, non healing - chest wall 11/10/2015   Tricuspid Regurgitation s/p Repair    a. 04/2015 s/p 26 mm Edwards mc3 ring annuloplasty   Wound infection 10/13/2015   Superficial sternal wound infection    Past Surgical History:  Procedure Laterality Date   APPENDECTOMY     APPLICATION OF A-CELL OF CHEST/ABDOMEN N/A 11/21/2015   Procedure: APPLICATION OF A-CELL OF CHEST/ABDOMEN;  Surgeon: Loel Lofty Dillingham, DO;  Location: [place];  Service: Plastics;  Laterality: N/A;   APPLICATION OF A-CELL OF CHEST/ABDOMEN Right 12/21/2015   Procedure: APPLICATION OF A-CELL OF RIGHT CHEST;  Surgeon: Loel Lofty Dillingham, DO;  Location: [place];  Service: Plastics;  Laterality: Right;   APPLICATION OF A-CELL OF CHEST/ABDOMEN Right 01/11/2016   Procedure: APPLICATION OF A-CELL OF RIGHT CHEST;  Surgeon: Loel Lofty Dillingham, DO;  Location: [place];  Service: Plastics;  Laterality: Right;   APPLICATION OF A-CELL OF CHEST/ABDOMEN Right 02/12/2016   Procedure: APPLICATION OF A-CELL TO RIGHT CHEST WOUND;  Surgeon: Loel Lofty Dillingham, DO;  Location: [place];  Service: Plastics;  Laterality: Right;   APPLICATION OF WOUND VAC N/A 05/09/2015   Procedure: APPLICATION OF WOUND VAC;  Surgeon: Rexene Alberts, MD;  Location: [place];  Service: Thoracic;  Laterality: N/A;   APPLICATION OF WOUND VAC N/A 11/13/2015   Procedure: APPLICATION OF WOUND VAC;  Surgeon: Rexene Alberts, MD;  Location: [place];  Service: Thoracic;  Laterality: N/A;   APPLICATION OF WOUND VAC N/A 11/21/2015   Procedure: APPLICATION OF WOUND VAC;  Surgeon: Loel Lofty Dillingham, DO;  Location: [place];   Service: Plastics;  Laterality: N/A;   APPLICATION OF WOUND VAC Right 12/21/2015   Procedure: APPLICATION OF WOUND VAC to right chest wall ;  Surgeon: Loel Lofty Dillingham, DO;  Location: [place];  Service: Plastics;  Laterality: Right;   APPLICATION OF WOUND VAC Right 01/11/2016   Procedure: APPLICATION OF WOUND VAC RIGHT CHEST ;  Surgeon: Loel Lofty Dillingham, DO;  Location: [place];  Service: Plastics;  Laterality: Right;   APPLICATION OF WOUND VAC Right 01/24/2016   Procedure: APPLICATION OF WOUND VAC RIGHT CHEST WALL;  Surgeon: Loel Lofty Dillingham, DO;  Location: [place];  Service: Plastics;  Laterality: Right;   APPLICATION OF WOUND VAC Right 02/12/2016   Procedure: RE-APPLICATION OF WOUND VAC TO RIGHT CHEST WOUND;  Surgeon: Loel Lofty Dillingham, DO;  Location: [place];  Service: Plastics;  Laterality: Right;   BREAST BIOPSY Left 11/25/06   neg   BREAST BIOPSY Left 01/20/12   /clip-neg   CARDIAC CATHETERIZATION  11/2013   [facility]   CARDIAC CATHETERIZATION  10/2014   [facility]   CARDIAC VALVE REPLACEMENT     CARDIOVERSION N/A 07/20/2018   Procedure: CARDIOVERSION;  Surgeon: Wellington Hampshire, MD;  Location: [facility];  Service: Cardiovascular;  Laterality: N/A;   CATARACT EXTRACTION W/ INTRAOCULAR LENS  IMPLANT, BILATERAL Bilateral 2014   CLIPPING OF ATRIAL APPENDAGE N/A 04/25/2015   Procedure: CLIPPING OF ATRIAL APPENDAGE;  Surgeon: Rexene Alberts, MD;  Location: [place];  Service: Open Heart Surgery;  Laterality: N/A;   COCHLEAR IMPLANT Left 2005?   COLONOSCOPY WITH PROPOFOL N/A 01/20/2018   Procedure: COLONOSCOPY WITH PROPOFOL;  Surgeon: Toledo, Benay Pike, MD;  Location: [facility];  Service: Gastroenterology;  Laterality: N/A;   CORONARY ANGIOPLASTY     CORONARY ARTERY BYPASS GRAFT N/A 04/25/2015   Procedure: CORONARY ARTERY BYPASS GRAFTING (CABG) x ONE, using left internal mammary artery;  Surgeon: Rexene Alberts, MD;  Location: [place];  Service: Open Heart Surgery;  Laterality: N/A;    DILATION AND CURETTAGE OF UTERUS  "several before hysterectomy"   EP IMPLANTABLE DEVICE N/A 05/02/2015   Procedure: Pacemaker Implant;  Surgeon: Thompson Grayer, MD;  Location: [place] [facility];  Service: Cardiovascular;  Laterality: N/A;   ESOPHAGOGASTRODUODENOSCOPY (EGD) WITH PROPOFOL N/A 01/20/2018   Procedure: ESOPHAGOGASTRODUODENOSCOPY (EGD) WITH PROPOFOL;  Surgeon: Toledo, Benay Pike, MD;  Location: [facility];  Service: Gastroenterology;  Laterality: N/A;   EYE SURGERY     I&D EXTREMITY Right 12/06/2015   Procedure: IRRIGATION AND DEBRIDEMENT RIGHT CHEST WALL WITH ACELL PLACEMENT AND VAC;  Surgeon: Loel Lofty Dillingham, DO;  Location: [place];  Service: Plastics;  Laterality: Right;   INCISION AND DRAINAGE OF WOUND N/A 11/21/2015   Procedure: IRRIGATION AND DEBRIDEMENT WOUND;  Surgeon: Loel Lofty Dillingham, DO;  Location: [place];  Service: Plastics;  Laterality: N/A;   INCISION AND DRAINAGE OF WOUND Right 12/21/2015   Procedure: IRRIGATION AND DEBRIDEMENT right chest wall WOUND;  Surgeon: Loel Lofty Dillingham, DO;  Location: [place];  Service: Plastics;  Laterality: Right;  right chest wall    INCISION AND DRAINAGE OF WOUND Right 01/24/2016   Procedure: IRRIGATION AND DEBRIDEMENT RIGHT CHEST WALL WOUND;  Surgeon: Loel Lofty Dillingham, DO;  Location: [place];  Service: Plastics;  Laterality: Right;   INCISION AND DRAINAGE OF WOUND Right 03/28/2016   Procedure: IRRIGATION AND DEBRIDEMENT RIGHT CHEST WALL WOUND WITH A Cell Placement;  Surgeon: Loel Lofty Dillingham, DO;  Location: [place];  Service: Plastics;  Laterality: Right;   INCISION AND DRAINAGE OF WOUND Right 05/17/2016   Procedure: IRRIGATION AND DEBRIDEMENT OF RIGHT CHEST WOUND WITH A CELL PLACEMENT;  Surgeon: Loel Lofty Dillingham, DO;  Location: [place];  Service: Plastics;  Laterality: Right;   INSERT / REPLACE / REMOVE PACEMAKER     IRRIGATION AND DEBRIDEMENT OF WOUND WITH SPLIT THICKNESS SKIN GRAFT Right 01/11/2016   Procedure: IRRIGATION AND  DEBRIDEMENT OF RIGHT CHEST WOUND ;  Surgeon: Loel Lofty Dillingham, DO;  Location: [place];  Service: Plastics;  Laterality: Right;   MASTECTOMY, PARTIAL Right 2002   positive   MAZE N/A 04/25/2015   Procedure: MAZE;  Surgeon: Rexene Alberts, MD;  Location: [place];  Service: Open Heart Surgery;  Laterality: N/A;   MITRAL VALVE REPAIR N/A 04/25/2015   Procedure: MITRAL VALVE  REPLACEMENT using a 27 mm Edwards Perimount Magna Mitral Ease Valve;  Surgeon: Rexene Alberts, MD;  Location: [place];  Service: Open Heart Surgery;  Laterality:  N/A;   RIGHT HEART CATH N/A 02/08/2019   Procedure: RIGHT HEART CATH;  Surgeon: Wellington Hampshire, MD;  Location: [place] [facility];  Service: Cardiovascular;  Laterality: N/A;   SKIN SPLIT GRAFT Right 02/12/2016   Procedure: IRRIGATION AND DEBRIDEMENT RIGHT CHEST WOUND;  Surgeon: Loel Lofty Dillingham, DO;  Location: [place];  Service: Plastics;  Laterality: Right;   STERNAL WIRES REMOVAL N/A 06/05/2015   Procedure: STERNAL WIRES REMOVAL;  Surgeon: Rexene Alberts, MD;  Location: [place];  Service: Thoracic;  Laterality: N/A;   STERNAL WOUND DEBRIDEMENT N/A 05/09/2015   Procedure: STERNAL WOUND DEBRIDEMENT;  Surgeon: Rexene Alberts, MD;  Location: [place];  Service: Thoracic;  Laterality: N/A;   STERNAL WOUND DEBRIDEMENT N/A 11/13/2015   Procedure: Excisional drainage of RIGHT Chest wall mass and breast mass ;  Surgeon: Rexene Alberts, MD;  Location: [place];  Service: Thoracic;  Laterality: N/A;   TEE WITHOUT CARDIOVERSION N/A 04/25/2015   Procedure: TRANSESOPHAGEAL ECHOCARDIOGRAM (TEE);  Surgeon: Rexene Alberts, MD;  Location: [place];  Service: Open Heart Surgery;  Laterality: N/A;   TONSILLECTOMY     TRAM Right 11/18/2015   Procedure: VRAM Vertical Rectus Abdominus Muscle Flap;  Surgeon: Loel Lofty Dillingham, DO;  Location: [place];  Service: Plastics;  Laterality: Right;  RIght Back   TRICUSPID VALVE REPLACEMENT N/A 04/25/2015   Procedure: TRICUSPID VALVE REPAIR;  Surgeon:  Rexene Alberts, MD;  Location: [place];  Service: Open Heart Surgery;  Laterality: N/A;   VAGINAL HYSTERECTOMY      Current Medications: Current Meds  Medication Sig   AMBULATORY NON FORMULARY MEDICATION Inogen at Home Oxygen machine with tubing. DX: COPD DX Code: J44.9   amiodarone (PACERONE) 200 MG tablet TAKE 1 TABLET BY MOUTH EVERY DAY   aspirin EC 81 MG EC tablet Take 1 tablet (81 mg total) by mouth daily.   bisoprolol (ZEBETA) 5 MG tablet Take 1 tablet (5 mg total) by mouth daily.   diphenhydrAMINE (BENADRYL) 25 mg capsule Take 25 mg by mouth every 6 (six) hours as needed for allergies.   ferrous sulfate 325 (65 FE) MG tablet Take 325 mg by mouth daily with breakfast.   furosemide (LASIX) 20 MG tablet Take 20-40 mg by mouth 2 (two) times daily. Take 40mg  morning and 20mg  in the evening   gabapentin (NEURONTIN) 100 MG capsule Take 200 mg by mouth at bedtime.    hydrALAZINE (APRESOLINE) 10 MG tablet Take 0.5 tablets (5 mg total) by mouth every 8 (eight) hours.   ipratropium-albuterol (DUONEB) 0.5-2.5 (3) MG/3ML SOLN Take 3 mLs by nebulization every 6 (six) hours as needed. DX:J44.9   levothyroxine (SYNTHROID, LEVOTHROID) 88 MCG tablet Take 88 mcg by mouth daily before breakfast.   Multiple Vitamins-Minerals (PRESERVISION AREDS 2 PO) Take 2 tablets by mouth daily.   pantoprazole (PROTONIX) 40 MG tablet Take 40 mg by mouth 2 (two) times daily.    sacubitril-valsartan (ENTRESTO) 24-26 MG Take 1 tablet by mouth 2 (two) times daily.   warfarin (COUMADIN) 2.5 MG tablet TAKE AS DIRECTED BY COUMADIN CLINIC (Patient taking differently: Take 1.25-2.5 mg by mouth daily at 6 PM. Take one tablet (2.5 mg) by mouth on Tuesday, Thursday, Saturday and Sunday. Take one-half tablet (1.25 mg) on Monday, Wednesday and Friday)    Allergies:   Ace inhibitors, Amoxicillin-pot clavulanate, Penicillins, Nifedipine, Propranolol, Diazepam, Meperidine, and Propoxyphene   Social History    Socioeconomic History   Marital status: Married  Spouse name: Not on file   Number of children: Not on file   Years of education: Not on file   Highest education level: Not on file  Occupational History   Not on file  Social Needs   Financial resource strain: Not on file   Food insecurity    Worry: Not on file    Inability: Not on file   Transportation needs    Medical: Not on file    Non-medical: Not on file  Tobacco Use   Smoking status: Former Smoker    Packs/day: 1.50    Years: 40.00    Pack years: 60.00    Types: Cigarettes    Quit date: 04/20/1998    Years since quitting: 20.9   Smokeless tobacco: Never Used  Substance and Sexual Activity   Alcohol use: Yes    Comment: 11/10/2015 "glass of wine a few times/year, if that"   Drug use: No   Sexual activity: Yes  Lifestyle   Physical activity    Days per week: Not on file    Minutes per session: Not on file   Stress: Not on file  Relationships   Social connections    Talks on phone: Not on file    Gets together: Not on file    Attends religious service: Not on file    Active member of club or organization: Not on file    Attends meetings of clubs or organizations: Not on file    Relationship status: Not on file  Other Topics Concern   Not on file  Social History Narrative   Not on file     Family History:  The patient's family history includes Breast cancer in her maternal aunt and paternal aunt; CVA in her mother; Heart attack in her paternal uncle; Heart disease in her brother and father.  ROS:   Review of Systems  Constitutional: Positive for malaise/fatigue. Negative for chills, diaphoresis, fever and weight loss.  HENT: Negative for congestion.   Eyes: Negative for discharge and redness.  Respiratory: Positive for shortness of breath. Negative for cough, hemoptysis, sputum production and wheezing.        Stable shortness of breath  Cardiovascular: Negative for chest pain,  palpitations, orthopnea, claudication, leg swelling and PND.  Gastrointestinal: Negative for abdominal pain, blood in stool, heartburn, melena, nausea and vomiting.  Genitourinary: Negative for hematuria.  Musculoskeletal: Negative for falls and myalgias.  Skin: Negative for rash.  Neurological: Positive for weakness. Negative for dizziness, tingling, tremors, sensory change, speech change, focal weakness and loss of consciousness.  Endo/Heme/Allergies: Does not bruise/bleed easily.  Psychiatric/Behavioral: Negative for substance abuse. The patient is not nervous/anxious.   All other systems reviewed and are negative.    EKGs/Labs/Other Studies Reviewed:    Studies reviewed were summarized above. The additional studies were reviewed today:   2D Echo 04/01/2019: 1. The left ventricle has moderately reduced systolic function, with an ejection fraction of 35-40%. The cavity size was normal. There is moderately increased left ventricular wall thickness. Left ventricular diastolic function could not be evaluated  due to mitral valve replacement/repair. Left ventricular diffuse hypokinesis.  2. The right ventricle has moderately reduced systolic function. The cavity was mildly enlarged. There is not assessed. Right ventricular systolic pressure is upper normal with an estimated pressure of 34.6 mmHg.  3. Left atrial size was mildly dilated.  4. A 27 mm bioprosthetic valve is present in the mitral position. Normal mitral valve prosthesis.  5. The pericardial effusion  is posterior to the left ventricle.  6. Trivial pericardial effusion is present.  7. Status post tricuspid valve repair. valve is present in the tricuspid position.  8. The aortic valve was not well visualized. Aortic valve regurgitation is trivial by color flow Doppler.  9. The aorta is normal in size and structure. 10. The interatrial septum was not well visualized. __________  RHC 02/08/2019: Right heart catheterization was  performed.  This showed moderately elevated filling pressures, severe pulmonary hypertension and mildly reduced cardiac output. No evidence of left-to-right intra-cardiac shunting by saturation run.   Pulmonary capillary wedge tracing showed large V waves suggestive of mitral regurgitation.  Recommendations: The patient is still significantly volume overloaded.  I increased furosemide to 40 mg in the morning and 20 mg in the afternoon.  Repeat echocardiogram this month and if EF is below 40%, I agree with upgrading her pacemaker to CRT.  EKG:  EKG is ordered today.  The EKG ordered today demonstrates AV paced rhythm, 61 bpm  Recent Labs: 02/22/2019: B Natriuretic Peptide 806.0 02/26/2019: Magnesium 2.5 03/16/2019: ALT 16; BUN 25; Creatinine, Ser 0.97; Hemoglobin 7.2; Platelets 207; Potassium 4.8; Sodium 146; TSH 1.360  Recent Lipid Panel    Component Value Date/Time   TRIG 48 02/22/2019 1100    PHYSICAL EXAM:    VS:  BP 106/60 (BP Location: Left Arm, Patient Position: Sitting, Cuff Size: Normal)    Pulse 60    Ht 5' 1.5" (1.562 m)    Wt 123 lb 4 oz (55.9 kg)    BMI 22.91 kg/m   BMI: Body mass index is 22.91 kg/m.  Physical Exam  Constitutional: She is oriented to person, place, and time. She appears well-developed and well-nourished.  HENT:  Head: Normocephalic and atraumatic.  Eyes: Right eye exhibits no discharge. Left eye exhibits no discharge.  Neck: Normal range of motion. No JVD present.  Cardiovascular: Normal rate, regular rhythm, S1 normal, S2 normal and normal heart sounds. Exam reveals no distant heart sounds, no friction rub, no midsystolic click and no opening snap.  No murmur heard. Pulses:      Posterior tibial pulses are 2+ on the right side and 2+ on the left side.  Pulmonary/Chest: Effort normal. No respiratory distress. She has decreased breath sounds. She has no wheezes. She has no rales. She exhibits no tenderness.  Abdominal: Soft. She exhibits no distension.  There is no abdominal tenderness.  Musculoskeletal:        General: Edema present.     Comments: Trace bilateral ankle edema  Neurological: She is alert and oriented to person, place, and time.  Skin: Skin is warm and dry. No cyanosis. Nails show no clubbing.  Psychiatric: She has a normal mood and affect. Her speech is normal and behavior is normal. Judgment and thought content normal.    Wt Readings from Last 3 Encounters:  04/13/19 123 lb 4 oz (55.9 kg)  03/16/19 118 lb (53.5 kg)  03/11/19 118 lb (53.5 kg)     ASSESSMENT & PLAN:   1. CAD involving the native coronary arteries status post CABG without angina: She is doing well with any symptoms concerning for angina.  She remains on aspirin and Coumadin.  Continue bisoprolol and simvastatin.  Continue secondary prevention.  No plans for ischemic evaluation at this time.  2. Chronic systolic CHF/bi-V failure/pulmonary hypertension: Patient appears euvolemic and well compensated.  Continue current medical therapy including bisoprolol, Entresto, spironolactone, and Lasix.  Recent labs indicated stable renal function  and potassium.  Continue to follow-up with EP as directed for consideration of upgrade of device to CRT once her underlying anemia is further evaluated and treated..  3. Persistent A. fib: Most recent device interrogation showed no recurrent A. fib.  AV paced rhythm today.  Continue bisoprolol, amiodarone, and Coumadin.  Most recent TSH and liver function normal.  Follow-up with pulmonology as directed.  Hemoglobin remains low, though stable.  No signs of active bleeding.  Recent Hemoccult negative x2.  Follow-up with Coumadin clinic as directed.  4. Valvular heart disease: Status post bioprosthetic mitral valve replacement.  Appeared to be functioning normally on most recent echo.  5. Complete heart block: Status post PPM.  Device appears to be functioning normally.  Follow-up with EP as directed.  6. Anemia: Most recent  hemoglobin of 6.8.  Has appointment with PCP for follow-up next week.  Patient's husband would like referral to hematology coordinated with PCP.  7. Recent COVID-19 infection: Status post admission to [facility].  Post admission echo showed stable cardiomyopathy as outlined above.  Follow-up with PCP as directed.  8. Hyperlipidemia: LDL of 56 from 07/2018.  Remains on simvastatin.  Followed by PCP.  Disposition: F/u with Dr. Fletcher Anon in 3 months and EP as directed.    Medication Adjustments/Labs and Tests Ordered: Current medicines are reviewed at length with the patient today.  Concerns regarding medicines are outlined above. Medication changes, Labs and Tests ordered today are summarized above and listed in the Patient Instructions accessible in Encounters.   Signed, Christell Faith, PA-C 04/13/2019 9:36 AM     [facility] [address] [place] [address] [place], [place] [address] [phone]

## 2019-04-13 ENCOUNTER — Encounter: Payer: Self-pay | Admitting: Physician Assistant

## 2019-04-13 ENCOUNTER — Ambulatory Visit (INDEPENDENT_AMBULATORY_CARE_PROVIDER_SITE_OTHER): Payer: Medicare Other | Admitting: Physician Assistant

## 2019-04-13 ENCOUNTER — Other Ambulatory Visit: Payer: Self-pay

## 2019-04-13 VITALS — BP 106/60 | HR 60 | Ht 61.5 in | Wt 123.2 lb

## 2019-04-13 DIAGNOSIS — I2510 Atherosclerotic heart disease of native coronary artery without angina pectoris: Secondary | ICD-10-CM

## 2019-04-13 DIAGNOSIS — E785 Hyperlipidemia, unspecified: Secondary | ICD-10-CM

## 2019-04-13 DIAGNOSIS — I340 Nonrheumatic mitral (valve) insufficiency: Secondary | ICD-10-CM

## 2019-04-13 DIAGNOSIS — Z953 Presence of xenogenic heart valve: Secondary | ICD-10-CM

## 2019-04-13 DIAGNOSIS — I442 Atrioventricular block, complete: Secondary | ICD-10-CM

## 2019-04-13 DIAGNOSIS — I272 Pulmonary hypertension, unspecified: Secondary | ICD-10-CM | POA: Diagnosis not present

## 2019-04-13 DIAGNOSIS — I4819 Other persistent atrial fibrillation: Secondary | ICD-10-CM | POA: Diagnosis not present

## 2019-04-13 DIAGNOSIS — I5042 Chronic combined systolic (congestive) and diastolic (congestive) heart failure: Secondary | ICD-10-CM

## 2019-04-13 DIAGNOSIS — D649 Anemia, unspecified: Secondary | ICD-10-CM

## 2019-04-13 DIAGNOSIS — Z950 Presence of cardiac pacemaker: Secondary | ICD-10-CM

## 2019-04-13 NOTE — Patient Instructions (Signed)
Medication Instructions:  Your physician recommends that you continue on your current medications as directed. Please refer to the Current Medication list given to you today.  If you need a refill on your cardiac medications before your next appointment, please call your pharmacy.   Lab work: None ordered  If you have labs (blood work) drawn today and your tests are completely normal, you will receive your results only by: Marland Kitchen MyChart Message (if you have MyChart) OR . A paper copy in the mail If you have any lab test that is abnormal or we need to change your treatment, we will call you to review the results.  Testing/Procedures: None ordered   Follow-Up: At [facility], you and your health needs are our priority.  As part of our continuing mission to provide you with exceptional heart care, we have created designated Provider Care Teams.  These Care Teams include your primary Cardiologist (physician) and Advanced Practice Providers (APPs -  Physician Assistants and Nurse Practitioners) who all work together to provide you with the care you need, when you need it. You will need a follow up appointment in 3 months.  You may see Kathlyn Sacramento, MD or Christell Faith, PA-C.

## 2019-04-19 ENCOUNTER — Ambulatory Visit: Payer: Medicare Other | Admitting: Pulmonary Disease

## 2019-04-19 ENCOUNTER — Encounter: Payer: Self-pay | Admitting: Pulmonary Disease

## 2019-04-19 ENCOUNTER — Ambulatory Visit (INDEPENDENT_AMBULATORY_CARE_PROVIDER_SITE_OTHER): Payer: Medicare Other | Admitting: Pulmonary Disease

## 2019-04-19 DIAGNOSIS — I482 Chronic atrial fibrillation, unspecified: Secondary | ICD-10-CM

## 2019-04-19 DIAGNOSIS — R0609 Other forms of dyspnea: Secondary | ICD-10-CM

## 2019-04-19 DIAGNOSIS — I272 Pulmonary hypertension, unspecified: Secondary | ICD-10-CM

## 2019-04-19 DIAGNOSIS — I420 Dilated cardiomyopathy: Secondary | ICD-10-CM | POA: Diagnosis not present

## 2019-04-19 DIAGNOSIS — U071 COVID-19: Secondary | ICD-10-CM | POA: Diagnosis not present

## 2019-04-19 DIAGNOSIS — J449 Chronic obstructive pulmonary disease, unspecified: Secondary | ICD-10-CM | POA: Diagnosis not present

## 2019-04-19 DIAGNOSIS — J9611 Chronic respiratory failure with hypoxia: Secondary | ICD-10-CM

## 2019-04-19 DIAGNOSIS — T8203XD Leakage of heart valve prosthesis, subsequent encounter: Secondary | ICD-10-CM

## 2019-04-19 NOTE — Progress Notes (Addendum)
PULMONARY OFFICE FOLLOW UP NOTE  Date of initial consultation: 07/27/15 Reason for consultation: COPD  PT PROFILE:  84 y.o. F former smoker (up to 1.5 PPD, quit 1999) initially seen in 2016 for COPD. Previously followed by Dr Raul Del. She was first diagnosed with COPD in 2012 at the time of a pneumonia.   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 PFTs 03/17/15: Spirometry reveals mild/moderate obstruction (FEV1 67% predicted, FEV1/FVC 62%).  Lung volumes normal.  Diffusion capacity markedly reduced at 35% predicted.  DLCO/VA 54% predicted Echocardiogram 08/22/15: LVEF 30-35%. Diffuse HK. prosthetic valves functioning well ONO on RA 02/22/16: significant desaturation to low of 78%. Total desaturation events: 170 Echocardiogram 09/17/16: LVEF 50-55%.  LA mildly dilated.  RVSP est normal Echocardiogram 07/17/18: LVEF 20-25%.  Diffuse hypokinesis.  Prosthetic mitral valve functioning normally.  LA mildly dilated.  RV moderately dilated.  RV systolic function moderately reduced.  PA systolic pressure could not be accurately estimated Echocardiogram 08/24/18: LVEF 35-40%.  Mild concentric hypertrophy.  Diffuse hypokinesis.  Bioprosthetic mitral valve functioning normally.  LA mildly dilated.  RV cavity mildly dilated.  RVSP could not be accurately estimated. Echocardiogram 10/26/18: LVEF 35-40%.  LA moderately dilated.  Bioprosthetic mitral valve present.  RV systolic function mildly reduced.  RV cavity moderately enlarged.  RA moderately dilated.  RVSP est 26 mmHg R heart cath 02/08/19: Mean PCWP 23 mmHg. Large V wave c/w MR.  Echocardiogram 04/01/19: LVEF 35-40%.  Moderate LVH.  LA mildly dilated.  Bioprosthetic mitral valve present.  RV mildly dilated with moderately reduced systolic function.  RVSP 35 mmHg. PAP 69/24 mmHg  PROBLEMS: Mild COPD CHF S/P Maze procedure, CABG with  chronic sternal wound infection   Virtual Visit via Telephone Note I connected with SHARNI NEGRON on 04/19/19 at 11:15 AM EDT by telephone and verified that I am speaking with the correct person using two identifiers. I discussed the limitations, risks, security and privacy concerns of performing an evaluation and management service by telephone and the availability of in person appointments. I also discussed with the patient that there may be a patient responsible charge related to this service. The patient expressed understanding and agreed to proceed.    INTERVAL: Last seen by me 10/2017.  A lot has happened since that last visit.  She is undergone extensive cardiac evaluation with multiple echocardiograms and R heart catheterization documented above.  This cardiac evaluation is ongoing under the direction of Dr. Fletcher Anon.  Since last encounter, she has been instructed to wear oxygen 24 hours/day.   She was diagnosed with COVID-19 in June of this year.  She was hospitalized 6/15-6/19.  Her presenting symptom was worsening dyspnea and it was felt that CHF, not COVID-19, was the major cause of her respiratory compromise.  SUBJ:  This is a belated scheduled follow-up which was performed remotely (via telephone) due to the coronavirus pandemic.  In the last 18 months, she has had a very complicated course from a cardiac point of view as documented above.  Also as documented above, she was hospitalized briefly in June after testing positive for coronavirus.  Ultimately, it was felt that her worsening respiratory symptoms were more attributable to CHF than 2 COVID-19.  At the time of discharge, she was instructed to wear oxygen 24 hours/day.  At the present time, she has moderate exertional dyspnea which is her baseline.  She denies pleuritic or anginal  chest pain.  She has minimal cough or sputum production.  She is not on any maintenance COPD medications.  She uses nebulized bronchodilators intermittently  for "chest congestion".  She believes that she has not used her nebulized medications more than a handful of times this year and has not used it at all in the past several weeks.     OBJ: There were no vitals filed for this visit.   EXAM:  Due to the remote nature of this encounter, no physical examination could be performed  BMP Latest Ref Rng & Units 03/16/2019 02/26/2019 02/25/2019  Glucose 65 - 99 mg/dL 80 123(H) 107(H)  BUN 8 - 27 mg/dL 25 36(H) 30(H)  Creatinine 0.57 - 1.00 mg/dL 0.97 0.84 0.77  BUN/Creat Ratio 12 - 28 26 - -  Sodium 134 - 144 mmol/L 146(H) 141 143  Potassium 3.5 - 5.2 mmol/L 4.8 4.9 4.4  Chloride 96 - 106 mmol/L 101 92(L) 95(L)  CO2 20 - 29 mmol/L 29 37(H) 35(H)  Calcium 8.7 - 10.3 mg/dL 8.9 8.5(L) 8.6(L)    CBC Latest Ref Rng & Units 03/16/2019 02/26/2019 02/25/2019  WBC 3.4 - 10.8 x10E3/uL 6.6 7.0 8.5  Hemoglobin 11.1 - 15.9 g/dL 7.2(L) 8.0(L) 8.7(L)  Hematocrit 34.0 - 46.6 % 21.0(L) 24.1(L) 28.0(L)  Platelets 150 - 450 x10E3/uL 207 186 185   CXR 02/22/19: Status post sternotomy.  AICD in place.  Cardiomegaly.  Patchy bilateral infiltrates.  IMPRESSION:   ICD-10-CM   1. COPD, mild ([place])  J44.9   2. Chronic hypoxemic respiratory failure ([facility])  J96.11   3. Recent COVID-19 virus infection.  She has recovered fully from this  U07.1   4. Dilated cardiomyopathy ([place])  I42.0   5. Chronic atrial fibrillation  I48.20   6. Prosthetic mitral valve regurgitation  T82.03XD   7. 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 DOE (dyspnea on exertion)  R06.09    Although she does have mild COPD, exertional dyspnea and limitation is most likely primarily due to cardiac factors listed above.  She has never symptomatically benefited from maintenance therapy for COPD.  She uses nebulized bronchodilators infrequently.  PLAN: Continue oxygen therapy with sleep and as needed with exertion Continue DuoNeb (nebulized bronchodilators) as needed for  increased shortness of breath, chest tightness, wheezing, cough Follow-up in 6 months.  Call sooner if needed  25 mins were spent on this encounter  Merton Border, MD PCCM service Mobile [phone] Pager [phone]  04/19/2019 11:59 AM

## 2019-04-19 NOTE — Patient Instructions (Signed)
Continue oxygen therapy with sleep and as needed with exertion Continue nebulizer as needed for increased shortness of breath, chest tightness, wheezing, cough Follow-up in 6 months.  Call sooner if needed

## 2019-04-26 ENCOUNTER — Other Ambulatory Visit: Payer: Self-pay

## 2019-04-26 ENCOUNTER — Other Ambulatory Visit: Payer: Self-pay | Admitting: Internal Medicine

## 2019-04-26 ENCOUNTER — Ambulatory Visit (INDEPENDENT_AMBULATORY_CARE_PROVIDER_SITE_OTHER): Payer: Medicare Other

## 2019-04-26 DIAGNOSIS — I340 Nonrheumatic mitral (valve) insufficiency: Secondary | ICD-10-CM

## 2019-04-26 DIAGNOSIS — Z953 Presence of xenogenic heart valve: Secondary | ICD-10-CM | POA: Diagnosis not present

## 2019-04-26 DIAGNOSIS — Z5181 Encounter for therapeutic drug level monitoring: Secondary | ICD-10-CM | POA: Diagnosis not present

## 2019-04-26 LAB — POCT INR: INR: 3.2 — AB (ref 2.0–3.0)

## 2019-04-26 NOTE — Patient Instructions (Addendum)
Please have a LARGE serving of greens today and continue dosage of 1 tablet every day EXCEPT 1.5 TABLETS ON MONDAYS, [place]. Recheck INR in 3 weeks.

## 2019-04-29 ENCOUNTER — Telehealth: Payer: Self-pay | Admitting: Cardiovascular Disease

## 2019-04-29 NOTE — Telephone Encounter (Signed)
Spoke with the patient. patient was to have an upgrade to her Bi-V that was cancelled due to low hemoglobin. Patient hs had a recent CBC drawn at her PCP and her hemoglobin has improved to 9.4. (lab results are available for review in care everywhere) Advised the patient that I will fwd the update to [place] and his nurse Nira Conn, RN to advise and f/u. Patient verbalized understanding and voiced appreciation for the call.

## 2019-04-29 NOTE — Telephone Encounter (Signed)
Patient calling States that she went to family doctor and her blood count is up Patient is ready for procedure  Please call to discuss

## 2019-05-01 NOTE — Telephone Encounter (Signed)
Heather I think probably better for her to have OV to discuss upgrade Thanks SK

## 2019-05-03 ENCOUNTER — Telehealth: Payer: Self-pay | Admitting: Internal Medicine

## 2019-05-03 NOTE — Telephone Encounter (Signed)
The patient is scheduled for a Virtual Visit (phone) on 05/06/2019 at 11:40 am.  She does not have a smart phone/ iPhone.      Virtual Visit Pre-Appointment Phone Call  "(Name), I am calling you today to discuss your upcoming appointment. We are currently trying to limit exposure to the virus that causes COVID-19 by seeing patients at home rather than in the office."  1. "What is the BEST phone number to call the day of the visit?" - include this in appointment notes  2. Do you have or have access to (through a family member/friend) a smartphone with video capability that we can use for your visit?" a. If yes - list this number in appt notes as cell (if different from BEST phone #) and list the appointment type as a VIDEO visit in appointment notes b. If no - list the appointment type as a PHONE visit in appointment notes  3. Confirm consent - "In the setting of the current Covid19 crisis, you are scheduled for a (phone or video) visit with your provider on (date) at (time).  Just as we do with many in-office visits, in order for you to participate in this visit, we must obtain consent.  If you'd like, I can send this to your mychart (if signed up) or email for you to review.  Otherwise, I can obtain your verbal consent now.  All virtual visits are billed to your insurance company just like a normal visit would be.  By agreeing to a virtual visit, we'd like you to understand that the technology does not allow for your provider to perform an examination, and thus may limit your provider's ability to fully assess your condition. If your provider identifies any concerns that need to be evaluated in person, we will make arrangements to do so.  Finally, though the technology is pretty good, we cannot assure that it will always work on either your or our end, and in the setting of a video visit, we may have to convert it to a phone-only visit.  In either situation, we cannot ensure that we have a secure  connection.  Are you willing to proceed?" STAFF: Did the patient verbally acknowledge consent to telehealth visit? Document YES/NO here: Yes  4. Advise patient to be prepared - "Two hours prior to your appointment, go ahead and check your blood pressure, pulse, oxygen saturation, and your weight (if you have the equipment to check those) and write them all down. When your visit starts, your provider will ask you for this information. If you have an Apple Watch or Kardia device, please plan to have heart rate information ready on the day of your appointment. Please have a pen and paper handy nearby the day of the visit as well."  5. Give patient instructions for MyChart download to smartphone OR [email or web address] as below if video visit (depending on what platform provider is using)  6. Inform patient they will receive a phone call 15 minutes prior to their appointment time (may be from unknown caller ID) so they should be prepared to answer    TELEPHONE CALL NOTE  [name] has been deemed a candidate for a follow-up tele-health visit to limit community exposure during the Covid-19 pandemic. I spoke with the patient via phone to ensure availability of phone/video source, confirm preferred email & phone number, and discuss instructions and expectations.  I reminded [name] to be prepared with any vital sign and/or heart rhythm information  that could potentially be obtained via home monitoring, at the time of her visit. I reminded [name] to expect a phone call prior to her visit.  Alvis Lemmings, RN 05/03/2019 9:44 AM

## 2019-05-03 NOTE — Telephone Encounter (Signed)
I spoke with the patient and advised her of Dr. Olin Pia recommendations for a visit prior to proceeding with an upgrade of her Device.   She is agreeable with an e-visit on 05/06/19 at 11:40 am.

## 2019-05-06 ENCOUNTER — Other Ambulatory Visit: Payer: Self-pay

## 2019-05-06 ENCOUNTER — Telehealth (INDEPENDENT_AMBULATORY_CARE_PROVIDER_SITE_OTHER): Payer: Medicare Other | Admitting: Internal Medicine

## 2019-05-06 VITALS — BP 107/61 | HR 61 | Ht 61.5 in | Wt 119.0 lb

## 2019-05-06 DIAGNOSIS — I442 Atrioventricular block, complete: Secondary | ICD-10-CM

## 2019-05-06 DIAGNOSIS — I2510 Atherosclerotic heart disease of native coronary artery without angina pectoris: Secondary | ICD-10-CM

## 2019-05-06 DIAGNOSIS — I480 Paroxysmal atrial fibrillation: Secondary | ICD-10-CM | POA: Diagnosis not present

## 2019-05-06 DIAGNOSIS — Z950 Presence of cardiac pacemaker: Secondary | ICD-10-CM

## 2019-05-06 DIAGNOSIS — I5042 Chronic combined systolic (congestive) and diastolic (congestive) heart failure: Secondary | ICD-10-CM | POA: Diagnosis not present

## 2019-05-06 NOTE — Progress Notes (Signed)
Electrophysiology TeleHealth Note   Due to national recommendations of social distancing due to COVID 19, an audio/video telehealth visit is felt to be most appropriate for this patient at this time.  See MyChart message from today for the patient's consent to telehealth for [facility].   Date:  05/06/2019   ID:  [name], DOB [birth year], MRN [id number]  Location: patient's home  Provider location: [address], [place]  Evaluation Performed: Follow-up visit  PCP:  Ezequiel Kayser, MD  Cardiologist:    n Electrophysiologist:  [name]   Chief Complaint:    History of Present Illness:    [name] is a 84 y.o. female who presents via audio/video conferencing for a telehealth visit today. The patient did not have access to video technology/had technical difficulties with video requiring transitioning to audio format only (telephone).  All issues noted in this document were discussed and addressed.  No physical exam could be performed with this format.     Since last being seen in our clinic for consideration of CRT upgrade and found to be severely anemic  the patient reports doing much better has her hgb has increased to greater than 9.5    Has a pacemaker implanted 8/16 in the context of multi-valvular surgery bioprosthetic mitral valve replacement, tricuspid valve repair and MAZE operation. She developed postoperative complete heart block [facility])  History of atrial fibrillation anticoagulated with Coumadin and aspirin  DATE TEST EF   5/16 Echo   65 %   12/16 Echo   30-35 %   1/18 Echo  55%   11/19 Echo  20-25%   12/19 Echo  35%   7/20 Echo  35-40%   Previous complaints of exercise intolerance improved with decreasing rate response slope and ADL rate.       11/19 she was noted to be in rapid atrial fibrillation.  She was admitted 11/19 and started on amiodarone with a marked improvement in symptoms  Hospitalized 6/20 with increasing shortness of breath with  COVID positive.  Chest x-rays were unrevealing for pneumonia.  She has been maintained on amiodarone.  By device interrogation, no interval atrial fibrillation.  Over the last days she has had asymmetric left greater than right edema.  In the past she has had right greater than left edema.  She is on Coumadin last INR 02/26/2019 2.1 no history of DVT.  Date Cr K Hgb TSH LFTs  8/17     9.2    5/18 0.8  11.5    5/19 0.8 3.9 13.5 (MCV 103<<89)    11/19 0.8 3.9 9.9 2.168 43-  6/20 0.84 4.9 8.0 2.860 (2/20) 51             The patient denies symptoms of fevers, chills, cough, or new SOB worrisome for COVID 19.    Past Medical History:  Diagnosis Date  . Anxiety   . Arthritis    "hands" (11/10/2015)  . CAD s/p CABG x 1    a. 04/2015 [place] to LAD  . Chronic combined systolic (congestive) and diastolic (congestive) heart failure ([place])    a. 01/2015 Echo: EF 50-55%, Gr2 DD (preop valve surgery); b. 08/2015 Echo: EF 30-35%, diff HK. Gr2 DD; c. 09/2016 Echo: EF 50-55%. Nl fxning TV ring. Mildly dil LA.  Marland Kitchen COPD (chronic obstructive pulmonary disease) ([place])   . Coronary artery disease   . GERD (gastroesophageal reflux disease)   . History of colon polyps   .  History of mitral valve replacement    a. 04/2015 s/p 27 mm [facility] Mitral bovine bioprosthetic tissue valve  . Hyperlipidemia   . Hypertension   . Hypothyroidism   . 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 04/2015 Complete bilateral atrial lesion set using cryothermy and bipolar radiofrequency ablation with clipping of LA appendage  . Meniere disease   . Meniere's disease   . On home oxygen therapy    "2L at night" (11/10/2015)  . PAF (paroxysmal atrial fibrillation) ([place])   . Pneumonia ~ 2010  . PONV (postoperative nausea and vomiting)    Pt felt like eardrums were gonna pop  . Post-surgical complete heart block, symptomatic    a. 04/2015 s/p MDT [id number] Advisa DR MRI DC PPM.  . Presence of permanent cardiac pacemaker   .  Pulmonary hypertension ([place])   . Surgical wound, non healing - chest wall 11/10/2015  . Tricuspid Regurgitation s/p Repair    a. 04/2015 s/p 26 mm Edwards mc3 ring annuloplasty  . Wound infection 10/13/2015   Superficial sternal wound infection    Past Surgical History:  Procedure Laterality Date  . APPENDECTOMY    . APPLICATION OF A-CELL OF CHEST/ABDOMEN N/A 11/21/2015   Procedure: APPLICATION OF A-CELL OF CHEST/ABDOMEN;  Surgeon: Loel Lofty Dillingham, DO;  Location: [place];  Service: Plastics;  Laterality: N/A;  . APPLICATION OF A-CELL OF CHEST/ABDOMEN Right 12/21/2015   Procedure: APPLICATION OF A-CELL OF RIGHT CHEST;  Surgeon: Loel Lofty Dillingham, DO;  Location: [place];  Service: Plastics;  Laterality: Right;  . APPLICATION OF A-CELL OF CHEST/ABDOMEN Right 01/11/2016   Procedure: APPLICATION OF A-CELL OF RIGHT CHEST;  Surgeon: Loel Lofty Dillingham, DO;  Location: [place];  Service: Plastics;  Laterality: Right;  . APPLICATION OF A-CELL OF CHEST/ABDOMEN Right 02/12/2016   Procedure: APPLICATION OF A-CELL TO RIGHT CHEST WOUND;  Surgeon: Loel Lofty Dillingham, DO;  Location: [place];  Service: Plastics;  Laterality: Right;  . APPLICATION OF WOUND VAC N/A 05/09/2015   Procedure: APPLICATION OF WOUND VAC;  Surgeon: Rexene Alberts, MD;  Location: [place];  Service: Thoracic;  Laterality: N/A;  . APPLICATION OF WOUND VAC N/A 11/13/2015   Procedure: APPLICATION OF WOUND VAC;  Surgeon: Rexene Alberts, MD;  Location: [place];  Service: Thoracic;  Laterality: N/A;  . APPLICATION OF WOUND VAC N/A 11/21/2015   Procedure: APPLICATION OF WOUND VAC;  Surgeon: Loel Lofty Dillingham, DO;  Location: [place];  Service: Plastics;  Laterality: N/A;  . APPLICATION OF WOUND VAC Right 12/21/2015   Procedure: APPLICATION OF WOUND VAC to right chest wall ;  Surgeon: Loel Lofty Dillingham, DO;  Location: [place];  Service: Plastics;  Laterality: Right;  . APPLICATION OF WOUND VAC Right 01/11/2016   Procedure: APPLICATION OF WOUND VAC RIGHT CHEST ;   Surgeon: Loel Lofty Dillingham, DO;  Location: [place];  Service: Plastics;  Laterality: Right;  . APPLICATION OF WOUND VAC Right 01/24/2016   Procedure: APPLICATION OF WOUND VAC RIGHT CHEST WALL;  Surgeon: Loel Lofty Dillingham, DO;  Location: [place];  Service: Plastics;  Laterality: Right;  . APPLICATION OF WOUND VAC Right 02/12/2016   Procedure: RE-APPLICATION OF WOUND VAC TO RIGHT CHEST WOUND;  Surgeon: Loel Lofty Dillingham, DO;  Location: [place];  Service: Plastics;  Laterality: Right;  . BREAST BIOPSY Left 11/25/06   neg  . BREAST BIOPSY Left 01/20/12   /clip-neg  . CARDIAC CATHETERIZATION  11/2013   [facility]  .  CARDIAC CATHETERIZATION  10/2014   [facility]  . CARDIAC VALVE REPLACEMENT    . CARDIOVERSION N/A 07/20/2018   Procedure: CARDIOVERSION;  Surgeon: Wellington Hampshire, MD;  Location: [facility];  Service: Cardiovascular;  Laterality: N/A;  . CATARACT EXTRACTION W/ INTRAOCULAR LENS  IMPLANT, BILATERAL Bilateral 2014  . CLIPPING OF ATRIAL APPENDAGE N/A 04/25/2015   Procedure: CLIPPING OF ATRIAL APPENDAGE;  Surgeon: Rexene Alberts, MD;  Location: [place];  Service: Open Heart Surgery;  Laterality: N/A;  . COCHLEAR IMPLANT Left 2005?  . COLONOSCOPY WITH PROPOFOL N/A 01/20/2018   Procedure: COLONOSCOPY WITH PROPOFOL;  Surgeon: Toledo, Benay Pike, MD;  Location: [facility];  Service: Gastroenterology;  Laterality: N/A;  . CORONARY ANGIOPLASTY    . CORONARY ARTERY BYPASS GRAFT N/A 04/25/2015   Procedure: CORONARY ARTERY BYPASS GRAFTING (CABG) x ONE, using left internal mammary artery;  Surgeon: Rexene Alberts, MD;  Location: [place];  Service: Open Heart Surgery;  Laterality: N/A;  . DILATION AND CURETTAGE OF UTERUS  "several before hysterectomy"  . EP IMPLANTABLE DEVICE N/A 05/02/2015   Procedure: Pacemaker Implant;  Surgeon: Thompson Grayer, MD;  Location: [place] [facility];  Service: Cardiovascular;  Laterality: N/A;  . ESOPHAGOGASTRODUODENOSCOPY (EGD) WITH PROPOFOL N/A 01/20/2018   Procedure:  ESOPHAGOGASTRODUODENOSCOPY (EGD) WITH PROPOFOL;  Surgeon: Toledo, Benay Pike, MD;  Location: [facility];  Service: Gastroenterology;  Laterality: N/A;  . EYE SURGERY    . I&D EXTREMITY Right 12/06/2015   Procedure: IRRIGATION AND DEBRIDEMENT RIGHT CHEST WALL WITH ACELL PLACEMENT AND VAC;  Surgeon: Loel Lofty Dillingham, DO;  Location: [place];  Service: Plastics;  Laterality: Right;  . INCISION AND DRAINAGE OF WOUND N/A 11/21/2015   Procedure: IRRIGATION AND DEBRIDEMENT WOUND;  Surgeon: Loel Lofty Dillingham, DO;  Location: [place];  Service: Plastics;  Laterality: N/A;  . INCISION AND DRAINAGE OF WOUND Right 12/21/2015   Procedure: IRRIGATION AND DEBRIDEMENT right chest wall WOUND;  Surgeon: Loel Lofty Dillingham, DO;  Location: [place];  Service: Plastics;  Laterality: Right;  right chest wall   . INCISION AND DRAINAGE OF WOUND Right 01/24/2016   Procedure: IRRIGATION AND DEBRIDEMENT RIGHT CHEST WALL WOUND;  Surgeon: Loel Lofty Dillingham, DO;  Location: [place];  Service: Plastics;  Laterality: Right;  . INCISION AND DRAINAGE OF WOUND Right 03/28/2016   Procedure: IRRIGATION AND DEBRIDEMENT RIGHT CHEST WALL WOUND WITH A Cell Placement;  Surgeon: Loel Lofty Dillingham, DO;  Location: [place];  Service: Plastics;  Laterality: Right;  . INCISION AND DRAINAGE OF WOUND Right 05/17/2016   Procedure: IRRIGATION AND DEBRIDEMENT OF RIGHT CHEST WOUND WITH A CELL PLACEMENT;  Surgeon: Loel Lofty Dillingham, DO;  Location: [place];  Service: Plastics;  Laterality: Right;  . INSERT / REPLACE / REMOVE PACEMAKER    . IRRIGATION AND DEBRIDEMENT OF WOUND WITH SPLIT THICKNESS SKIN GRAFT Right 01/11/2016   Procedure: IRRIGATION AND DEBRIDEMENT OF RIGHT CHEST WOUND ;  Surgeon: Loel Lofty Dillingham, DO;  Location: [place];  Service: Plastics;  Laterality: Right;  . MASTECTOMY, PARTIAL Right 2002   positive  . MAZE N/A 04/25/2015   Procedure: MAZE;  Surgeon: Rexene Alberts, MD;  Location: [place];  Service: Open Heart Surgery;  Laterality: N/A;  .  MITRAL VALVE REPAIR N/A 04/25/2015   Procedure: MITRAL VALVE  REPLACEMENT using a 27 mm Edwards Perimount Magna Mitral Ease Valve;  Surgeon: Rexene Alberts, MD;  Location: [place];  Service: Open Heart Surgery;  Laterality: N/A;  . RIGHT HEART CATH  N/A 02/08/2019   Procedure: RIGHT HEART CATH;  Surgeon: Wellington Hampshire, MD;  Location: [place] [facility];  Service: Cardiovascular;  Laterality: N/A;  . SKIN SPLIT GRAFT Right 02/12/2016   Procedure: IRRIGATION AND DEBRIDEMENT RIGHT CHEST WOUND;  Surgeon: Loel Lofty Dillingham, DO;  Location: [place];  Service: Plastics;  Laterality: Right;  . STERNAL WIRES REMOVAL N/A 06/05/2015   Procedure: STERNAL WIRES REMOVAL;  Surgeon: Rexene Alberts, MD;  Location: [place];  Service: Thoracic;  Laterality: N/A;  . STERNAL WOUND DEBRIDEMENT N/A 05/09/2015   Procedure: STERNAL WOUND DEBRIDEMENT;  Surgeon: Rexene Alberts, MD;  Location: [place];  Service: Thoracic;  Laterality: N/A;  . STERNAL WOUND DEBRIDEMENT N/A 11/13/2015   Procedure: Excisional drainage of RIGHT Chest wall mass and breast mass ;  Surgeon: Rexene Alberts, MD;  Location: [place];  Service: Thoracic;  Laterality: N/A;  . TEE WITHOUT CARDIOVERSION N/A 04/25/2015   Procedure: TRANSESOPHAGEAL ECHOCARDIOGRAM (TEE);  Surgeon: Rexene Alberts, MD;  Location: [place];  Service: Open Heart Surgery;  Laterality: N/A;  . TONSILLECTOMY    . TRAM Right 11/18/2015   Procedure: VRAM Vertical Rectus Abdominus Muscle Flap;  Surgeon: Loel Lofty Dillingham, DO;  Location: [place];  Service: Plastics;  Laterality: Right;  RIght Back  . TRICUSPID VALVE REPLACEMENT N/A 04/25/2015   Procedure: TRICUSPID VALVE REPAIR;  Surgeon: Rexene Alberts, MD;  Location: [place];  Service: Open Heart Surgery;  Laterality: N/A;  . VAGINAL HYSTERECTOMY      Current Outpatient Medications  Medication Sig Dispense Refill  . AMBULATORY NON FORMULARY MEDICATION Inogen at Home Oxygen machine with tubing. DX: COPD DX Code: J44.9 1 each 0  . amiodarone  (PACERONE) 200 MG tablet TAKE 1 TABLET BY MOUTH EVERY DAY 90 tablet 2  . aspirin EC 81 MG EC tablet Take 1 tablet (81 mg total) by mouth daily.    . bisoprolol (ZEBETA) 5 MG tablet Take 1 tablet (5 mg total) by mouth daily. 90 tablet 3  . diphenhydrAMINE (BENADRYL) 25 mg capsule Take 25 mg by mouth every 6 (six) hours as needed for allergies.    . ferrous sulfate 325 (65 FE) MG tablet Take 325 mg by mouth daily with breakfast.    . furosemide (LASIX) 20 MG tablet Take 20-40 mg by mouth 2 (two) times daily. Take 40mg  morning and 20mg  in the evening    . gabapentin (NEURONTIN) 100 MG capsule Take 200 mg by mouth at bedtime.   1  . ipratropium-albuterol (DUONEB) 0.5-2.5 (3) MG/3ML SOLN Take 3 mLs by nebulization every 6 (six) hours as needed. DX:J44.9 1080 mL 1  . levothyroxine (SYNTHROID, LEVOTHROID) 88 MCG tablet Take 88 mcg by mouth daily before breakfast.    . Multiple Vitamins-Minerals (PRESERVISION AREDS 2 PO) Take 2 tablets by mouth daily.    . pantoprazole (PROTONIX) 40 MG tablet Take 40 mg by mouth 2 (two) times daily.     . sacubitril-valsartan (ENTRESTO) 24-26 MG Take 1 tablet by mouth 2 (two) times daily. 60 tablet 6  . simvastatin (ZOCOR) 10 MG tablet Take 1 tablet (10 mg total) by mouth at bedtime. 90 tablet 3  . spironolactone (ALDACTONE) 25 MG tablet Take 0.5 tablets (12.5 mg total) by mouth daily. 45 tablet 3  . warfarin (COUMADIN) 2.5 MG tablet TAKE AS DIRECTED BY COUMADIN CLINIC 165 tablet 0   No current facility-administered medications for this visit.     Allergies:   Ace inhibitors, Amoxicillin-pot clavulanate,  Penicillins, Nifedipine, Propranolol, Diazepam, Meperidine, and Propoxyphene   Social History:  The patient  reports that she quit smoking about 21 years ago. Her smoking use included cigarettes. She has a 60.00 pack-year smoking history. She has never used smokeless tobacco. She reports current alcohol use. She reports that she does not use drugs.   Family History:   The patient's   family history includes Breast cancer in her maternal aunt and paternal aunt; CVA in her mother; Heart attack in her paternal uncle; Heart disease in her brother and father.   ROS:  Please see the history of present illness.   All other systems are personally reviewed and negative.    Exam:    Vital Signs:  BP 107/61 (BP Location: Left Arm, Patient Position: Sitting, Cuff Size: Normal)   Pulse 61   Ht 5' 1.5" (1.562 m)   Wt 119 lb (54 kg)   BMI 22.12 kg/m       Labs/Other Tests and Data Reviewed:    Recent Labs: 02/22/2019: B Natriuretic Peptide 806.0 02/26/2019: Magnesium 2.5 03/16/2019: ALT 16; BUN 25; Creatinine, Ser 0.97; Hemoglobin 7.2; Platelets 207; Potassium 4.8; Sodium 146; TSH 1.360   Wt Readings from Last 3 Encounters:  05/06/19 119 lb (54 kg)  04/13/19 123 lb 4 oz (55.9 kg)  03/16/19 118 lb (53.5 kg)     Other studies personally reviewed: Additional studies/ records that were reviewed today include:As above and See Below     ASSESSMENT & PLAN:    Atrial fibrillation-persistent status post maze operation  Amiodarone   Complete heart block -Intermittent  Sinus node dysfunction   First-degree AV block profound  Status post mitral valve replacement-bioprosthetic with tricuspid valve repair  Pacemaker-[employer]  Dypsnea on Exertion   Cardiomyopathy   Anemia   High Risk Medication Surveillance  Her anemia is much improved and she feels better but her LV dysfunction remains impaired and almost certainly pacemaker induced cardiomyopathy  For quality of life it is reasonably notwithstanding the incomplete workup of this to proceed with CRT upgrade  We have reviewed the benefits and risks of generator replacement and lead insertion.  These include but are not limited to lead fracture and infection; pneumothorax, lead perforation.  The patient understands, agrees and is willing to proceed.      Now oxygen dependent with her COPD-- but this  has also been related to her heart failure it may also improve as suggested by pulm eval  04/19/19  We have to maintain vigilance regarding amiodarone pulmonary toxicity      Current medicines are reviewed at length with the patient today.   The patient does not have concerns regarding her medicines.  The following changes were made today:  none  Labs/ tests ordered today include:   CRT upgrade   No orders of the defined types were placed in this encounter.      Patient Risk:  after full review of this patients clinical status, I feel that they are at moderate  risk at this time.  We spent more than 50% of our >25 min visit in face to face counseling regarding the above .  Signed, Virl Axe, MD  05/06/2019 9:35 PM     [place]  [address] [phone] (office) [phone] (fax)

## 2019-05-19 ENCOUNTER — Other Ambulatory Visit: Payer: Self-pay

## 2019-05-19 ENCOUNTER — Ambulatory Visit (INDEPENDENT_AMBULATORY_CARE_PROVIDER_SITE_OTHER): Payer: Medicare Other

## 2019-05-19 DIAGNOSIS — Z953 Presence of xenogenic heart valve: Secondary | ICD-10-CM | POA: Diagnosis not present

## 2019-05-19 DIAGNOSIS — I340 Nonrheumatic mitral (valve) insufficiency: Secondary | ICD-10-CM

## 2019-05-19 DIAGNOSIS — Z5181 Encounter for therapeutic drug level monitoring: Secondary | ICD-10-CM | POA: Diagnosis not present

## 2019-05-19 LAB — POCT INR: INR: 2.5 (ref 2.0–3.0)

## 2019-05-19 NOTE — Patient Instructions (Signed)
Please continue dosage of 1 tablet every day EXCEPT 1.5 TABLETS ON MONDAYS, [place]. Recheck INR in 4 weeks.

## 2019-05-20 ENCOUNTER — Telehealth: Payer: Self-pay | Admitting: Internal Medicine

## 2019-05-20 DIAGNOSIS — I480 Paroxysmal atrial fibrillation: Secondary | ICD-10-CM

## 2019-05-20 DIAGNOSIS — Z01812 Encounter for preprocedural laboratory examination: Secondary | ICD-10-CM

## 2019-05-20 DIAGNOSIS — I5042 Chronic combined systolic (congestive) and diastolic (congestive) heart failure: Secondary | ICD-10-CM

## 2019-05-20 NOTE — Telephone Encounter (Signed)
I called and spoke with the patient to schedule her BI-V PPM upgrade. She is agreeable with scheduling this on Monday 06/07/19 for a 7:30 am case.  She is aware I will get this set up for her and call her back next week to go over all of her pre-procedure instructions.  The patient voices understanding and is agreeable.

## 2019-05-23 ENCOUNTER — Other Ambulatory Visit: Payer: Self-pay | Admitting: Internal Medicine

## 2019-05-28 NOTE — Telephone Encounter (Signed)
I called and spoke with the patient. I have advised her she will need to come in to the office on 9/24 for lab work and to pick up her surgical scrub/ instruction letter.  She is aware she will then need to get back in her car and drive up to the [facility] entrance for her COVID swab to be done between 12:30 pm & 2:30 pm.  The patient is agreeable with the above. She will come to the office at 12:00 pm for lab work/ pick up her surgical scrub/ instruction letter, then drive up to [facility] after that for her COVID swab to be done.   She is aware I am waiting to clarify with Dr. Caryl Comes if he would want her to hold her warfarin prior to her procedure, but I will clarify this on her instruction letter.

## 2019-06-01 ENCOUNTER — Other Ambulatory Visit: Payer: Self-pay | Admitting: Internal Medicine

## 2019-06-01 DIAGNOSIS — I442 Atrioventricular block, complete: Secondary | ICD-10-CM

## 2019-06-03 ENCOUNTER — Other Ambulatory Visit
Admission: RE | Admit: 2019-06-03 | Discharge: 2019-06-03 | Disposition: A | Discharge status: Home / Self Care | Payer: Medicare Other | Source: Ambulatory Visit | Attending: Internal Medicine | Admitting: Internal Medicine

## 2019-06-03 ENCOUNTER — Other Ambulatory Visit (INDEPENDENT_AMBULATORY_CARE_PROVIDER_SITE_OTHER): Payer: Medicare Other

## 2019-06-03 ENCOUNTER — Encounter: Payer: Self-pay | Admitting: *Deleted

## 2019-06-03 ENCOUNTER — Other Ambulatory Visit: Payer: Self-pay

## 2019-06-03 DIAGNOSIS — I480 Paroxysmal atrial fibrillation: Secondary | ICD-10-CM

## 2019-06-03 DIAGNOSIS — Z20828 Contact with and (suspected) exposure to other viral communicable diseases: Secondary | ICD-10-CM | POA: Diagnosis not present

## 2019-06-03 DIAGNOSIS — Z01812 Encounter for preprocedural laboratory examination: Secondary | ICD-10-CM | POA: Diagnosis present

## 2019-06-03 DIAGNOSIS — I5042 Chronic combined systolic (congestive) and diastolic (congestive) heart failure: Secondary | ICD-10-CM

## 2019-06-04 LAB — CBC WITH DIFFERENTIAL/PLATELET
Basophils Absolute: 0 10*3/uL (ref 0.0–0.2)
Basos: 1 %
EOS (ABSOLUTE): 0.1 10*3/uL (ref 0.0–0.4)
Eos: 1 %
Hematocrit: 35.2 % (ref 34.0–46.6)
Hemoglobin: 11.4 g/dL (ref 11.1–15.9)
Immature Grans (Abs): 0 10*3/uL (ref 0.0–0.1)
Immature Granulocytes: 0 %
Lymphocytes Absolute: 0.6 10*3/uL — ABNORMAL LOW (ref 0.7–3.1)
Lymphs: 10 %
MCH: 31.7 pg (ref 26.6–33.0)
MCHC: 32.4 g/dL (ref 31.5–35.7)
MCV: 98 fL — ABNORMAL HIGH (ref 79–97)
Monocytes Absolute: 0.5 10*3/uL (ref 0.1–0.9)
Monocytes: 8 %
Neutrophils Absolute: 4.8 10*3/uL (ref 1.4–7.0)
Neutrophils: 80 %
Platelets: 156 10*3/uL (ref 150–450)
RBC: 3.6 x10E6/uL — ABNORMAL LOW (ref 3.77–5.28)
RDW: 12.5 % (ref 11.7–15.4)
WBC: 6 10*3/uL (ref 3.4–10.8)

## 2019-06-04 LAB — PROTIME-INR
INR: 2 — ABNORMAL HIGH (ref 0.8–1.2)
Prothrombin Time: 20.8 s — ABNORMAL HIGH (ref 9.1–12.0)

## 2019-06-04 LAB — BASIC METABOLIC PANEL
BUN/Creatinine Ratio: 27 (ref 12–28)
BUN: 20 mg/dL (ref 8–27)
CO2: 27 mmol/L (ref 20–29)
Calcium: 9 mg/dL (ref 8.7–10.3)
Chloride: 99 mmol/L (ref 96–106)
Creatinine, Ser: 0.74 mg/dL (ref 0.57–1.00)
GFR calc Af Amer: 87 mL/min/{1.73_m2} (ref >59)
GFR calc non Af Amer: 76 mL/min/{1.73_m2} (ref >59)
Glucose: 100 mg/dL — ABNORMAL HIGH (ref 65–99)
Potassium: 4.2 mmol/L (ref 3.5–5.2)
Sodium: 145 mmol/L — ABNORMAL HIGH (ref 134–144)

## 2019-06-04 LAB — SARS CORONAVIRUS 2 (TAT 6-24 HRS): SARS Coronavirus 2: NEGATIVE

## 2019-06-07 ENCOUNTER — Other Ambulatory Visit: Payer: Self-pay

## 2019-06-07 ENCOUNTER — Ambulatory Visit (HOSPITAL_COMMUNITY)
Admission: RE | Disposition: A | Discharge status: Home / Self Care | Payer: Medicare Other | Source: Home / Self Care | Attending: Internal Medicine

## 2019-06-07 ENCOUNTER — Ambulatory Visit (HOSPITAL_COMMUNITY)
Admission: RE | Admit: 2019-06-07 | Discharge: 2019-06-07 | Disposition: A | Discharge status: Home / Self Care | Payer: Medicare Other | Attending: Internal Medicine | Admitting: Internal Medicine

## 2019-06-07 ENCOUNTER — Encounter (HOSPITAL_COMMUNITY): Payer: Self-pay | Admitting: Internal Medicine

## 2019-06-07 DIAGNOSIS — H8109 Meniere's disease, unspecified ear: Secondary | ICD-10-CM | POA: Diagnosis not present

## 2019-06-07 DIAGNOSIS — I110 Hypertensive heart disease with heart failure: Secondary | ICD-10-CM | POA: Diagnosis not present

## 2019-06-07 DIAGNOSIS — I272 Pulmonary hypertension, unspecified: Secondary | ICD-10-CM | POA: Diagnosis not present

## 2019-06-07 DIAGNOSIS — Z885 Allergy status to narcotic agent status: Secondary | ICD-10-CM | POA: Diagnosis not present

## 2019-06-07 DIAGNOSIS — Z950 Presence of cardiac pacemaker: Secondary | ICD-10-CM

## 2019-06-07 DIAGNOSIS — Z888 Allergy status to other drugs, medicaments and biological substances status: Secondary | ICD-10-CM | POA: Insufficient documentation

## 2019-06-07 DIAGNOSIS — J449 Chronic obstructive pulmonary disease, unspecified: Secondary | ICD-10-CM | POA: Diagnosis not present

## 2019-06-07 DIAGNOSIS — I495 Sick sinus syndrome: Secondary | ICD-10-CM | POA: Insufficient documentation

## 2019-06-07 DIAGNOSIS — Z880 Allergy status to penicillin: Secondary | ICD-10-CM | POA: Diagnosis not present

## 2019-06-07 DIAGNOSIS — Z7982 Long term (current) use of aspirin: Secondary | ICD-10-CM | POA: Insufficient documentation

## 2019-06-07 DIAGNOSIS — Z951 Presence of aortocoronary bypass graft: Secondary | ICD-10-CM | POA: Insufficient documentation

## 2019-06-07 DIAGNOSIS — Z79899 Other long term (current) drug therapy: Secondary | ICD-10-CM | POA: Insufficient documentation

## 2019-06-07 DIAGNOSIS — I5042 Chronic combined systolic (congestive) and diastolic (congestive) heart failure: Secondary | ICD-10-CM | POA: Insufficient documentation

## 2019-06-07 DIAGNOSIS — Z7901 Long term (current) use of anticoagulants: Secondary | ICD-10-CM | POA: Insufficient documentation

## 2019-06-07 DIAGNOSIS — M1990 Unspecified osteoarthritis, unspecified site: Secondary | ICD-10-CM | POA: Diagnosis not present

## 2019-06-07 DIAGNOSIS — D649 Anemia, unspecified: Secondary | ICD-10-CM | POA: Diagnosis not present

## 2019-06-07 DIAGNOSIS — Z87891 Personal history of nicotine dependence: Secondary | ICD-10-CM | POA: Diagnosis not present

## 2019-06-07 DIAGNOSIS — E039 Hypothyroidism, unspecified: Secondary | ICD-10-CM | POA: Insufficient documentation

## 2019-06-07 DIAGNOSIS — I440 Atrioventricular block, first degree: Secondary | ICD-10-CM | POA: Diagnosis present

## 2019-06-07 DIAGNOSIS — E785 Hyperlipidemia, unspecified: Secondary | ICD-10-CM | POA: Diagnosis not present

## 2019-06-07 DIAGNOSIS — Z79890 Hormone replacement therapy (postmenopausal): Secondary | ICD-10-CM | POA: Insufficient documentation

## 2019-06-07 DIAGNOSIS — Z952 Presence of prosthetic heart valve: Secondary | ICD-10-CM | POA: Insufficient documentation

## 2019-06-07 DIAGNOSIS — Z9011 Acquired absence of right breast and nipple: Secondary | ICD-10-CM | POA: Insufficient documentation

## 2019-06-07 DIAGNOSIS — I428 Other cardiomyopathies: Secondary | ICD-10-CM

## 2019-06-07 DIAGNOSIS — I2510 Atherosclerotic heart disease of native coronary artery without angina pectoris: Secondary | ICD-10-CM | POA: Diagnosis not present

## 2019-06-07 DIAGNOSIS — I4819 Other persistent atrial fibrillation: Secondary | ICD-10-CM | POA: Diagnosis not present

## 2019-06-07 DIAGNOSIS — I442 Atrioventricular block, complete: Secondary | ICD-10-CM

## 2019-06-07 DIAGNOSIS — K219 Gastro-esophageal reflux disease without esophagitis: Secondary | ICD-10-CM | POA: Insufficient documentation

## 2019-06-07 DIAGNOSIS — Z90710 Acquired absence of both cervix and uterus: Secondary | ICD-10-CM | POA: Insufficient documentation

## 2019-06-07 DIAGNOSIS — Z8249 Family history of ischemic heart disease and other diseases of the circulatory system: Secondary | ICD-10-CM | POA: Insufficient documentation

## 2019-06-07 DIAGNOSIS — Z823 Family history of stroke: Secondary | ICD-10-CM | POA: Insufficient documentation

## 2019-06-07 HISTORY — PX: BIV UPGRADE: EP1202

## 2019-06-07 LAB — PROTIME-INR
INR: 1.6 — ABNORMAL HIGH (ref 0.8–1.2)
Prothrombin Time: 18.4 seconds — ABNORMAL HIGH (ref 11.4–15.2)

## 2019-06-07 LAB — SURGICAL PCR SCREEN
MRSA, PCR: NEGATIVE
Staphylococcus aureus: NEGATIVE

## 2019-06-07 SURGERY — BIV UPGRADE
Anesthesia: LOCAL

## 2019-06-07 MED ORDER — VANCOMYCIN HCL IN DEXTROSE 1-5 GM/200ML-% IV SOLN
1000.0000 mg | INTRAVENOUS | Status: AC
  Administered 2019-06-07: 08:00:00 1000 mg via INTRAVENOUS

## 2019-06-07 MED ORDER — HEPARIN (PORCINE) IN NACL 1000-0.9 UT/500ML-% IV SOLN
INTRAVENOUS | Status: DC | PRN
  Administered 2019-06-07: 500 mL

## 2019-06-07 MED ORDER — MUPIROCIN 2 % EX OINT
TOPICAL_OINTMENT | CUTANEOUS | Status: AC
  Administered 2019-06-07: 1
  Filled 2019-06-07: qty 22

## 2019-06-07 MED ORDER — SODIUM CHLORIDE 0.9 % IV SOLN
80.0000 mg | INTRAVENOUS | Status: AC
  Administered 2019-06-07: 80 mg

## 2019-06-07 MED ORDER — AMIODARONE HCL 100 MG PO TABS: 100.0000 mg | ORAL_TABLET | Freq: Every day | ORAL | Status: DC

## 2019-06-07 MED ORDER — IOHEXOL 350 MG/ML SOLN
INTRAVENOUS | Status: DC | PRN
  Administered 2019-06-07: 08:00:00 10 mL
  Administered 2019-06-07: 40 mL

## 2019-06-07 MED ORDER — LIDOCAINE HCL (PF) 1 % IJ SOLN
INTRAMUSCULAR | Status: DC | PRN
  Administered 2019-06-07: 08:00:00 60 mL

## 2019-06-07 MED ORDER — MIDAZOLAM HCL 5 MG/5ML IJ SOLN
INTRAMUSCULAR | Status: AC
  Filled 2019-06-07: qty 5

## 2019-06-07 MED ORDER — LIDOCAINE HCL 1 % IJ SOLN
INTRAMUSCULAR | Status: AC
  Filled 2019-06-07: qty 60

## 2019-06-07 MED ORDER — CHLORHEXIDINE GLUCONATE 4 % EX LIQD: 60.0000 mL | Freq: Once | CUTANEOUS | Status: DC

## 2019-06-07 MED ORDER — SODIUM CHLORIDE 0.9 % IV SOLN
INTRAVENOUS | Status: DC
  Administered 2019-06-07: 07:00:00 via INTRAVENOUS

## 2019-06-07 MED ORDER — SODIUM CHLORIDE 0.9 % IV SOLN
INTRAVENOUS | Status: AC
  Filled 2019-06-07: qty 2

## 2019-06-07 MED ORDER — VANCOMYCIN HCL IN DEXTROSE 1-5 GM/200ML-% IV SOLN
INTRAVENOUS | Status: AC
  Filled 2019-06-07: qty 200

## 2019-06-07 MED ORDER — FENTANYL CITRATE (PF) 100 MCG/2ML IJ SOLN
INTRAMUSCULAR | Status: AC
  Filled 2019-06-07: qty 2

## 2019-06-07 MED ORDER — MIDAZOLAM HCL 5 MG/5ML IJ SOLN
INTRAMUSCULAR | Status: DC | PRN
  Administered 2019-06-07 (×4): 0.5 mg via INTRAVENOUS

## 2019-06-07 MED ORDER — BISOPROLOL FUMARATE 5 MG PO TABS: 2.5000 mg | ORAL_TABLET | Freq: Every day | ORAL | Status: DC

## 2019-06-07 MED ORDER — FENTANYL CITRATE (PF) 100 MCG/2ML IJ SOLN
INTRAMUSCULAR | Status: DC | PRN
  Administered 2019-06-07 (×4): 12.5 ug via INTRAVENOUS

## 2019-06-07 MED ORDER — HEPARIN (PORCINE) IN NACL 1000-0.9 UT/500ML-% IV SOLN
INTRAVENOUS | Status: AC
  Filled 2019-06-07: qty 500

## 2019-06-07 SURGICAL SUPPLY — 13 items
ADAPTER SEALING SSA-EW-09 (MISCELLANEOUS) ×2 IMPLANT
BALLN ATTAIN 80 (BALLOONS) ×2
BALLOON ATTAIN 80 (BALLOONS) ×1 IMPLANT
CABLE SURGICAL S-101-97-12 (CABLE) ×2 IMPLANT
CATH ATTAIN SEL SURV 6248V-130 (CATHETERS) ×2 IMPLANT
CATH CPS DIRECT 135 DS2C020 (CATHETERS) ×2 IMPLANT
KIT ESSENTIALS PG (KITS) ×2 IMPLANT
KIT MICROPUNCTURE NIT STIFF (SHEATH) ×2 IMPLANT
PAD PRO RADIOLUCENT 2001M-C (PAD) ×2 IMPLANT
SHEATH 9.5FR PRELUDE SNAP 13 (SHEATH) ×2 IMPLANT
TRAY PACEMAKER INSERTION (PACKS) ×2 IMPLANT
WIRE ACUITY WHISPER EDS 4648 (WIRE) ×6 IMPLANT
WIRE HI TORQ VERSACORE-J 145CM (WIRE) ×4 IMPLANT

## 2019-06-07 NOTE — Discharge Instructions (Signed)
Tissue Adhesive Wound Care Some cuts and wounds can be closed with skin glue (tissue adhesive). Skin glue holds the skin together and helps your wound heal faster. Skin glue goes away on its own as your wound gets better. Follow these instructions at home:  Wound care  Showers are allowed 24 hours after treatment. Do not soak the wound in water. Do not take baths, swim, or use hot tubs. Do not use soaps or creams on your wound.  If a bandage (dressing) was put on the wound: ? Wash your hands with soap and water before you change your bandage. ? Change the bandage as often as told by your doctor. ? Leave skin glue in place. It will fall off on its own after 7-10 days. ? Keep the bandage dry.  Do not scratch, rub, or pick at the skin glue.  Do not put tape over the skin glue. The skin glue could come off when you take the tape off.  Protect the wound from another injury.  Protect the wound from sun and tanning beds. General instructions  Take over-the-counter and prescription medicines only as told by your doctor.  Keep all follow-up visits as told by your doctor. This is important. Get help right away if:  Your wound is red, puffy (swollen), hot, or tender.  You get a rash after the glue is put on.  You have more pain in the wound.  You have a red streak going away from the wound.  You have yellowish-white fluid (pus) coming from the wound.  You have more bleeding.  You have a fever.  You have chills and you start to shake.  You notice a bad smell coming from the wound.  Your wound or skin glue breaks open. This information is not intended to replace advice given to you by your health care provider. Make sure you discuss any questions you have with your health care provider. Document Released: 06/04/2008 Document Revised: 08/08/2017 Document Reviewed: 07/19/2016 Elsevier Patient Education  2020 [employer].

## 2019-06-07 NOTE — H&P (Signed)
Patient Care Team: Ezequiel Kayser, MD as PCP - General (Internal Medicine) Wellington Hampshire, MD as PCP - Cardiology (Cardiology) Deboraha Sprang, MD as PCP - Electrophysiology (Cardiology) Alisa Graff, FNP as Nurse Practitioner (Family Medicine)   HPI  [name] is a 84 y.o. female Admitted for CRT upgrade for LV dysfunction associated with chronic RV apical pacing  DATE TEST EF   5/16 Echo   65 %   12/16 Echo   30-35 %   1/18 Echo  55%   11/19 Echo  20-25%   12/19 Echo  35%   7/20 Echo  35-40%   Previous complaints of exercise intolerance improved with decreasing rate response slope and ADL rate.     11/19 she was noted to be in rapid atrial fibrillation.  She was admitted 11/19 and started on amiodarone with a marked improvement in symptoms  Hospitalized 6/20 with increasing shortness of breath with COVID positive.  Chest x-rays were unrevealing for pneumonia.  She has been maintained on amiodarone.  By device interrogation, no interval atrial fibrillation.  Over the last days she has had asymmetric left greater than right edema.  In the past she has had right greater than left edema.  She is on Coumadin   Date Cr K Hgb TSH LFTs  8/17     9.2    5/18 0.8  11.5    5/19 0.8 3.9 13.5 (MCV 103<<89)    11/19 0.8 3.9 9.9 2.168 43-  6/20 0.84 4.9 8.0 1.36 ( 7/20) 51  9/20 0.74 4.2 11.4     Dyspnea improved with improved Hgb, still using O2 at home.  No CP  Or edema  Records and Results Reviewed   Past Medical History:  Diagnosis Date  . Anxiety   . Arthritis    "hands" (11/10/2015)  . CAD s/p CABG x 1    a. 04/2015 [place] to LAD  . Chronic combined systolic (congestive) and diastolic (congestive) heart failure ([place])    a. 01/2015 Echo: EF 50-55%, Gr2 DD (preop valve surgery); b. 08/2015 Echo: EF 30-35%, diff HK. Gr2 DD; c. 09/2016 Echo: EF 50-55%. Nl fxning TV ring. Mildly dil LA.  Marland Kitchen COPD (chronic obstructive pulmonary disease) ([place])    . Coronary artery disease   . GERD (gastroesophageal reflux disease)   . History of colon polyps   . History of mitral valve replacement    a. 04/2015 s/p 27 mm [facility] Mitral bovine bioprosthetic tissue valve  . Hyperlipidemia   . Hypertension   . Hypothyroidism   . 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 04/2015 Complete bilateral atrial lesion set using cryothermy and bipolar radiofrequency ablation with clipping of LA appendage  . Meniere disease   . Meniere's disease   . On home oxygen therapy    "2L at night" (11/10/2015)  . PAF (paroxysmal atrial fibrillation) ([place])   . Pneumonia ~ 2010  . PONV (postoperative nausea and vomiting)    Pt felt like eardrums were gonna pop  . Post-surgical complete heart block, symptomatic    a. 04/2015 s/p MDT [id number] Advisa DR MRI DC PPM.  . Presence of permanent cardiac pacemaker   . Pulmonary hypertension ([place])   . Surgical wound, non healing - chest wall 11/10/2015  . Tricuspid Regurgitation s/p Repair    a. 04/2015 s/p 26 mm Edwards mc3 ring annuloplasty  . Wound infection 10/13/2015   Superficial sternal  wound infection    Past Surgical History:  Procedure Laterality Date  . APPENDECTOMY    . APPLICATION OF A-CELL OF CHEST/ABDOMEN N/A 11/21/2015   Procedure: APPLICATION OF A-CELL OF CHEST/ABDOMEN;  Surgeon: Loel Lofty Dillingham, DO;  Location: [place];  Service: Plastics;  Laterality: N/A;  . APPLICATION OF A-CELL OF CHEST/ABDOMEN Right 12/21/2015   Procedure: APPLICATION OF A-CELL OF RIGHT CHEST;  Surgeon: Loel Lofty Dillingham, DO;  Location: [place];  Service: Plastics;  Laterality: Right;  . APPLICATION OF A-CELL OF CHEST/ABDOMEN Right 01/11/2016   Procedure: APPLICATION OF A-CELL OF RIGHT CHEST;  Surgeon: Loel Lofty Dillingham, DO;  Location: [place];  Service: Plastics;  Laterality: Right;  . APPLICATION OF A-CELL OF CHEST/ABDOMEN Right 02/12/2016   Procedure: APPLICATION OF A-CELL TO RIGHT CHEST WOUND;  Surgeon: Loel Lofty Dillingham, DO;   Location: [place];  Service: Plastics;  Laterality: Right;  . APPLICATION OF WOUND VAC N/A 05/09/2015   Procedure: APPLICATION OF WOUND VAC;  Surgeon: Rexene Alberts, MD;  Location: [place];  Service: Thoracic;  Laterality: N/A;  . APPLICATION OF WOUND VAC N/A 11/13/2015   Procedure: APPLICATION OF WOUND VAC;  Surgeon: Rexene Alberts, MD;  Location: [place];  Service: Thoracic;  Laterality: N/A;  . APPLICATION OF WOUND VAC N/A 11/21/2015   Procedure: APPLICATION OF WOUND VAC;  Surgeon: Loel Lofty Dillingham, DO;  Location: [place];  Service: Plastics;  Laterality: N/A;  . APPLICATION OF WOUND VAC Right 12/21/2015   Procedure: APPLICATION OF WOUND VAC to right chest wall ;  Surgeon: Loel Lofty Dillingham, DO;  Location: [place];  Service: Plastics;  Laterality: Right;  . APPLICATION OF WOUND VAC Right 01/11/2016   Procedure: APPLICATION OF WOUND VAC RIGHT CHEST ;  Surgeon: Loel Lofty Dillingham, DO;  Location: [place];  Service: Plastics;  Laterality: Right;  . APPLICATION OF WOUND VAC Right 01/24/2016   Procedure: APPLICATION OF WOUND VAC RIGHT CHEST WALL;  Surgeon: Loel Lofty Dillingham, DO;  Location: [place];  Service: Plastics;  Laterality: Right;  . APPLICATION OF WOUND VAC Right 02/12/2016   Procedure: RE-APPLICATION OF WOUND VAC TO RIGHT CHEST WOUND;  Surgeon: Loel Lofty Dillingham, DO;  Location: [place];  Service: Plastics;  Laterality: Right;  . BREAST BIOPSY Left 11/25/06   neg  . BREAST BIOPSY Left 01/20/12   /clip-neg  . CARDIAC CATHETERIZATION  11/2013   [facility]  . CARDIAC CATHETERIZATION  10/2014   [facility]  . CARDIAC VALVE REPLACEMENT    . CARDIOVERSION N/A 07/20/2018   Procedure: CARDIOVERSION;  Surgeon: Wellington Hampshire, MD;  Location: [facility];  Service: Cardiovascular;  Laterality: N/A;  . CATARACT EXTRACTION W/ INTRAOCULAR LENS  IMPLANT, BILATERAL Bilateral 2014  . CLIPPING OF ATRIAL APPENDAGE N/A 04/25/2015   Procedure: CLIPPING OF ATRIAL APPENDAGE;  Surgeon: Rexene Alberts, MD;  Location: [place];  Service:  Open Heart Surgery;  Laterality: N/A;  . COCHLEAR IMPLANT Left 2005?  . COLONOSCOPY WITH PROPOFOL N/A 01/20/2018   Procedure: COLONOSCOPY WITH PROPOFOL;  Surgeon: Tol[place], Benay Pike, MD;  Location: [facility];  Service: Gastroenterology;  Laterality: N/A;  . CORONARY ANGIOPLASTY    . CORONARY ARTERY BYPASS GRAFT N/A 04/25/2015   Procedure: CORONARY ARTERY BYPASS GRAFTING (CABG) x ONE, using left internal mammary artery;  Surgeon: Rexene Alberts, MD;  Location: [place];  Service: Open Heart Surgery;  Laterality: N/A;  . DILATION AND CURETTAGE OF UTERUS  "several before hysterectomy"  . EP IMPLANTABLE DEVICE N/A 05/02/2015  Procedure: Pacemaker Implant;  Surgeon: Thompson Grayer, MD;  Location: [place] [facility];  Service: Cardiovascular;  Laterality: N/A;  . ESOPHAGOGASTRODUODENOSCOPY (EGD) WITH PROPOFOL N/A 01/20/2018   Procedure: ESOPHAGOGASTRODUODENOSCOPY (EGD) WITH PROPOFOL;  Surgeon: Toledo, Benay Pike, MD;  Location: [facility];  Service: Gastroenterology;  Laterality: N/A;  . EYE SURGERY    . I&D EXTREMITY Right 12/06/2015   Procedure: IRRIGATION AND DEBRIDEMENT RIGHT CHEST WALL WITH ACELL PLACEMENT AND VAC;  Surgeon: Loel Lofty Dillingham, DO;  Location: [place];  Service: Plastics;  Laterality: Right;  . INCISION AND DRAINAGE OF WOUND N/A 11/21/2015   Procedure: IRRIGATION AND DEBRIDEMENT WOUND;  Surgeon: Loel Lofty Dillingham, DO;  Location: [place];  Service: Plastics;  Laterality: N/A;  . INCISION AND DRAINAGE OF WOUND Right 12/21/2015   Procedure: IRRIGATION AND DEBRIDEMENT right chest wall WOUND;  Surgeon: Loel Lofty Dillingham, DO;  Location: [place];  Service: Plastics;  Laterality: Right;  right chest wall   . INCISION AND DRAINAGE OF WOUND Right 01/24/2016   Procedure: IRRIGATION AND DEBRIDEMENT RIGHT CHEST WALL WOUND;  Surgeon: Loel Lofty Dillingham, DO;  Location: [place];  Service: Plastics;  Laterality: Right;  . INCISION AND DRAINAGE OF WOUND Right 03/28/2016   Procedure: IRRIGATION AND  DEBRIDEMENT RIGHT CHEST WALL WOUND WITH A Cell Placement;  Surgeon: Loel Lofty Dillingham, DO;  Location: [place];  Service: Plastics;  Laterality: Right;  . INCISION AND DRAINAGE OF WOUND Right 05/17/2016   Procedure: IRRIGATION AND DEBRIDEMENT OF RIGHT CHEST WOUND WITH A CELL PLACEMENT;  Surgeon: Loel Lofty Dillingham, DO;  Location: [place];  Service: Plastics;  Laterality: Right;  . INSERT / REPLACE / REMOVE PACEMAKER    . IRRIGATION AND DEBRIDEMENT OF WOUND WITH SPLIT THICKNESS SKIN GRAFT Right 01/11/2016   Procedure: IRRIGATION AND DEBRIDEMENT OF RIGHT CHEST WOUND ;  Surgeon: Loel Lofty Dillingham, DO;  Location: [place];  Service: Plastics;  Laterality: Right;  . MASTECTOMY, PARTIAL Right 2002   positive  . MAZE N/A 04/25/2015   Procedure: MAZE;  Surgeon: Rexene Alberts, MD;  Location: [place];  Service: Open Heart Surgery;  Laterality: N/A;  . MITRAL VALVE REPAIR N/A 04/25/2015   Procedure: MITRAL VALVE  REPLACEMENT using a 27 mm Edwards Perimount Magna Mitral Ease Valve;  Surgeon: Rexene Alberts, MD;  Location: [place];  Service: Open Heart Surgery;  Laterality: N/A;  . RIGHT HEART CATH N/A 02/08/2019   Procedure: RIGHT HEART CATH;  Surgeon: Wellington Hampshire, MD;  Location: [place] [facility];  Service: Cardiovascular;  Laterality: N/A;  . SKIN SPLIT GRAFT Right 02/12/2016   Procedure: IRRIGATION AND DEBRIDEMENT RIGHT CHEST WOUND;  Surgeon: Loel Lofty Dillingham, DO;  Location: [place];  Service: Plastics;  Laterality: Right;  . STERNAL WIRES REMOVAL N/A 06/05/2015   Procedure: STERNAL WIRES REMOVAL;  Surgeon: Rexene Alberts, MD;  Location: [place];  Service: Thoracic;  Laterality: N/A;  . STERNAL WOUND DEBRIDEMENT N/A 05/09/2015   Procedure: STERNAL WOUND DEBRIDEMENT;  Surgeon: Rexene Alberts, MD;  Location: [place];  Service: Thoracic;  Laterality: N/A;  . STERNAL WOUND DEBRIDEMENT N/A 11/13/2015   Procedure: Excisional drainage of RIGHT Chest wall mass and breast mass ;  Surgeon: Rexene Alberts, MD;  Location: [place];  Service: Thoracic;  Laterality: N/A;  . TEE WITHOUT CARDIOVERSION N/A 04/25/2015   Procedure: TRANSESOPHAGEAL ECHOCARDIOGRAM (TEE);  Surgeon: Rexene Alberts, MD;  Location: [place];  Service: Open Heart Surgery;  Laterality: N/A;  .  TONSILLECTOMY    . TRAM Right 11/18/2015   Procedure: VRAM Vertical Rectus Abdominus Muscle Flap;  Surgeon: Loel Lofty Dillingham, DO;  Location: [place];  Service: Plastics;  Laterality: Right;  RIght Back  . TRICUSPID VALVE REPLACEMENT N/A 04/25/2015   Procedure: TRICUSPID VALVE REPAIR;  Surgeon: Rexene Alberts, MD;  Location: [place];  Service: Open Heart Surgery;  Laterality: N/A;  . VAGINAL HYSTERECTOMY      Current Facility-Administered Medications  Medication Dose Route Frequency Provider Last Rate Last Dose  . 0.9 %  sodium chloride infusion   Intravenous Continuous Deboraha Sprang, MD 50 mL/hr at 06/07/19 [phone]    . chlorhexidine (HIBICLENS) 4 % liquid 4 application  60 mL Topical Once Deboraha Sprang, MD      . gentamicin (GARAMYCIN) 80 mg in sodium chloride 0.9 % 500 mL irrigation  80 mg Irrigation On Call Deboraha Sprang, MD      . vancomycin (VANCOCIN) IVPB 1000 mg/200 mL premix  1,000 mg Intravenous On Call Deboraha Sprang, MD        Allergies  Allergen Reactions  . Ace Inhibitors Cough and Other (See Comments)  . Amoxicillin-Pot Clavulanate Swelling and Other (See Comments)    SWOLLEN JOINTS  Has patient had a PCN reaction causing immediate rash, facial/tongue/throat swelling, SOB or lightheadedness with hypotension: Yes Has patient had a PCN reaction causing severe rash involving mucus membranes or skin necrosis: No Has patient had a PCN reaction that required hospitalization No Has patient had a PCN reaction occurring within the last 10 years: No If all of the above answers are "NO", then may proceed with Cephalosporin  . Penicillins Swelling and Other (See Comments)    SWOLLEN JOINTS  Has patient had a PCN reaction causing immediate rash,  facial/tongue/throat swelling, SOB or lightheadedness with hypotension: Yes Has patient had a PCN reaction causing severe rash involving mucus membranes or skin necrosis: No Has patient had a PCN reaction that required hospitalization No Has patient had a PCN reaction occurring within the last 10 years: No If all of the above answers are "NO", then may proceed with Cephalosporin use.  Swollen joints  . Nifedipine Other (See Comments)    UNSPECIFIED UNSPECIFIED   . Propranolol Other (See Comments)    UNSPECIFIED UNSPECIFIED   . Diazepam Other (See Comments)    Made her "hyper"   . Meperidine Other (See Comments)    Made her "hyper"    . Propoxyphene Other (See Comments)    Made her "hyper"        Social History   Tobacco Use  . Smoking status: Former Smoker    Packs/day: 1.50    Years: 40.00    Pack years: 60.00    Types: Cigarettes    Quit date: 04/20/1998    Years since quitting: 21.1  . Smokeless tobacco: Never Used  Substance Use Topics  . Alcohol use: Yes    Comment: 11/10/2015 "glass of wine a few times/year, if that"  . Drug use: No     Family History  Problem Relation Age of Onset  . Heart disease Father   . Heart disease Brother   . CVA Mother   . Heart attack Paternal Uncle   . Breast cancer Maternal Aunt   . Breast cancer Paternal Aunt      Current Meds  Medication Sig  . amiodarone (PACERONE) 200 MG tablet TAKE 1 TABLET BY MOUTH EVERY DAY (Patient taking differently: Take 200  mg by mouth daily. )  . aspirin EC 81 MG EC tablet Take 1 tablet (81 mg total) by mouth daily.  . bisoprolol (ZEBETA) 5 MG tablet Take 1 tablet (5 mg total) by mouth daily.  Marland Kitchen docusate sodium (COLACE) 100 MG capsule Take 100 mg by mouth 2 (two) times daily as needed (constipation.).  Marland Kitchen ENTRESTO 24-26 MG TAKE 1 TABLET BY MOUTH TWICE A DAY (Patient taking differently: Take 1 tablet by mouth 2 (two) times daily. )  . ferrous sulfate 325 (65 FE) MG tablet Take 325 mg by mouth  daily with breakfast.  . furosemide (LASIX) 20 MG tablet Take 20-40 mg by mouth See admin instructions. Take 2 tablets (40 mg) by mouth in the morning & take 1 tablet (20 mg) by mouth in the afternoon.  . gabapentin (NEURONTIN) 100 MG capsule Take 200 mg by mouth at bedtime.   Marland Kitchen ipratropium-albuterol (DUONEB) 0.5-2.5 (3) MG/3ML SOLN Take 3 mLs by nebulization every 6 (six) hours as needed. DX:J44.9 (Patient taking differently: Take 3 mLs by nebulization every 6 (six) hours as needed (with cold symptoms.). DX:J44.9)  . levothyroxine (SYNTHROID, LEVOTHROID) 88 MCG tablet Take 88 mcg by mouth daily before breakfast.  . Multiple Vitamins-Minerals (PRESERVISION AREDS 2 PO) Take 1 tablet by mouth 2 (two) times daily.   . pantoprazole (PROTONIX) 40 MG tablet Take 40 mg by mouth 2 (two) times daily.   . simvastatin (ZOCOR) 10 MG tablet Take 1 tablet (10 mg total) by mouth at bedtime.  Marland Kitchen spironolactone (ALDACTONE) 25 MG tablet Take 0.5 tablets (12.5 mg total) by mouth daily.  Marland Kitchen warfarin (COUMADIN) 2.5 MG tablet TAKE AS DIRECTED BY COUMADIN CLINIC (Patient taking differently: Take 2.5-3.75 mg by mouth See admin instructions. Take 1.5 tablets (3.75 mg) by mouth in the evenings on Mondays, Wednesdays, & Fridays. Take 1 tablet (2.5 mg) by mouth in the evening on Sundays, Tuesdays, Thursdays, & Saturdays.)     Review of Systems negative except from HPI and PMH  Physical Exam BP (!) 146/58   Pulse 66   Temp 97.7 F (36.5 C) (Skin)   Resp 16   Ht 5\' 2"  (1.575 m)   Wt 54.4 kg   SpO2 100%   BMI 21.95 kg/m  Well developed and well nourished in no acute distress HENT normal E scleral and icterus clear Neck Supple JVP flat; carotids brisk and full Clear to ausculation Regular rate and rhythm, no murmurs gallops or rub Soft with active bowel sounds No clubbing cyanosis  Edema Alert and oriented, grossly normal motor and sensory function Skin Warm and Dry    Assessment and  Plan   Atrial  fibrillation-persistent status post maze operation  Amiodarone   Complete heart block -Intermittent  Sinus node dysfunction   First-degree AV block profound  Status post mitral valve replacement-bioprosthetic with tricuspid valve repair  Pacemaker-[employer]tr[employer]  Dypsnea on Exertion   Cardiomyopathy   Anemia   High Risk Medication Surveillance   For CRT upgtrade today  Generator replacement if able  We have reviewed the benefits and risks of generator replacement.  These include but are not limited to lead fracture and infection.  The patient understands, agrees and is willing to proceed.

## 2019-06-08 ENCOUNTER — Telehealth: Payer: Self-pay | Admitting: Internal Medicine

## 2019-06-08 MED ORDER — BISOPROLOL FUMARATE 5 MG PO TABS: ORAL_TABLET | ORAL | Status: DC

## 2019-06-08 MED ORDER — AMIODARONE HCL 200 MG PO TABS: ORAL_TABLET | ORAL | Status: DC

## 2019-06-08 MED FILL — Lidocaine HCl Local Inj 1%: INTRAMUSCULAR | Qty: 40 | Status: AC

## 2019-06-08 NOTE — Telephone Encounter (Signed)
Please call to discuss medications. States Dr. Caryl Comes was going to cut one of her medications in half, but not sure which one.

## 2019-06-08 NOTE — Telephone Encounter (Signed)
I spoke with Dr. Caryl Comes regarding the patient's medication.   Per Dr. Caryl Comes: 1) Decrease amiodarone 200 mg- take 1/2 tablet (100 mg) by mouth once daily  2) Decrease bisoprolol 5 mg- take 1/2 tablet (2.5 mg) by mouth once daily  I have called and notified the patient of the above recommendations and she verbalizes understanding.

## 2019-06-10 ENCOUNTER — Ambulatory Visit (INDEPENDENT_AMBULATORY_CARE_PROVIDER_SITE_OTHER): Payer: Medicare Other | Admitting: *Deleted

## 2019-06-10 DIAGNOSIS — I442 Atrioventricular block, complete: Secondary | ICD-10-CM | POA: Diagnosis not present

## 2019-06-12 LAB — CUP PACEART REMOTE DEVICE CHECK
Battery Remaining Longevity: 59 mo
Battery Voltage: 3 V
Brady Statistic AP VP Percent: 0.08 %
Brady Statistic AP VS Percent: 99.35 %
Brady Statistic AS VP Percent: 0 %
Brady Statistic AS VS Percent: 0.56 %
Brady Statistic RA Percent Paced: 99.42 %
Brady Statistic RV Percent Paced: 0.09 %
Date Time Interrogation Session: 20201002124538
Implantable Lead Implant Date: 20160823
Implantable Lead Implant Date: 20160823
Implantable Lead Location: 753859
Implantable Lead Location: 753860
Implantable Lead Model: 5076
Implantable Lead Model: 5076
Implantable Pulse Generator Implant Date: 20160823
Lead Channel Impedance Value: 342 Ohm
Lead Channel Impedance Value: 380 Ohm
Lead Channel Impedance Value: 399 Ohm
Lead Channel Impedance Value: 418 Ohm
Lead Channel Pacing Threshold Amplitude: 0.5 V
Lead Channel Pacing Threshold Amplitude: 0.625 V
Lead Channel Pacing Threshold Pulse Width: 0.4 ms
Lead Channel Pacing Threshold Pulse Width: 0.4 ms
Lead Channel Sensing Intrinsic Amplitude: 1.625 mV
Lead Channel Sensing Intrinsic Amplitude: 1.625 mV
Lead Channel Sensing Intrinsic Amplitude: 13.125 mV
Lead Channel Sensing Intrinsic Amplitude: 13.125 mV
Lead Channel Setting Pacing Amplitude: 2 V
Lead Channel Setting Pacing Amplitude: 2.5 V
Lead Channel Setting Pacing Pulse Width: 0.4 ms
Lead Channel Setting Sensing Sensitivity: 2.8 mV

## 2019-06-16 ENCOUNTER — Ambulatory Visit (INDEPENDENT_AMBULATORY_CARE_PROVIDER_SITE_OTHER): Payer: Medicare Other

## 2019-06-16 ENCOUNTER — Other Ambulatory Visit: Payer: Self-pay

## 2019-06-16 DIAGNOSIS — Z953 Presence of xenogenic heart valve: Secondary | ICD-10-CM | POA: Diagnosis not present

## 2019-06-16 DIAGNOSIS — Z5181 Encounter for therapeutic drug level monitoring: Secondary | ICD-10-CM | POA: Diagnosis not present

## 2019-06-16 DIAGNOSIS — I340 Nonrheumatic mitral (valve) insufficiency: Secondary | ICD-10-CM | POA: Diagnosis not present

## 2019-06-16 LAB — POCT INR: INR: 2.5 (ref 2.0–3.0)

## 2019-06-16 NOTE — Patient Instructions (Signed)
Please continue dosage of 1 tablet every day EXCEPT 1.5 TABLETS ON MONDAYS, [place]. Recheck INR in 5 weeks.

## 2019-06-17 ENCOUNTER — Telehealth: Payer: Self-pay | Admitting: Internal Medicine

## 2019-06-17 NOTE — Telephone Encounter (Signed)
Spoke with tech services  Chanetta Marshall, NP 06/17/2019 12:58 PM

## 2019-06-17 NOTE — Telephone Encounter (Signed)
New Message   Vj is calling in from [employer] and is requesting a call back from the device clinic to verify the patient's device and some information about the procedure of inserting the device. Please give Vj a call back.

## 2019-06-17 NOTE — Progress Notes (Signed)
Remote pacemaker transmission.   

## 2019-06-22 ENCOUNTER — Ambulatory Visit (INDEPENDENT_AMBULATORY_CARE_PROVIDER_SITE_OTHER): Payer: Medicare Other | Admitting: Student

## 2019-06-22 ENCOUNTER — Other Ambulatory Visit: Payer: Self-pay

## 2019-06-22 DIAGNOSIS — I5042 Chronic combined systolic (congestive) and diastolic (congestive) heart failure: Secondary | ICD-10-CM | POA: Diagnosis not present

## 2019-06-22 DIAGNOSIS — I442 Atrioventricular block, complete: Secondary | ICD-10-CM | POA: Diagnosis not present

## 2019-06-22 LAB — CUP PACEART INCLINIC DEVICE CHECK
Battery Remaining Longevity: 68 mo
Battery Voltage: 3 V
Brady Statistic AP VP Percent: 0.05 %
Brady Statistic AP VS Percent: 99.15 %
Brady Statistic AS VP Percent: 0 %
Brady Statistic AS VS Percent: 0.8 %
Brady Statistic RA Percent Paced: 99.19 %
Brady Statistic RV Percent Paced: 0.06 %
Date Time Interrogation Session: 20201013152443
Implantable Lead Implant Date: 20160823
Implantable Lead Implant Date: 20160823
Implantable Lead Location: 753859
Implantable Lead Location: 753860
Implantable Lead Model: 5076
Implantable Lead Model: 5076
Implantable Pulse Generator Implant Date: 20160823
Lead Channel Impedance Value: 361 Ohm
Lead Channel Impedance Value: 399 Ohm
Lead Channel Impedance Value: 418 Ohm
Lead Channel Impedance Value: 418 Ohm
Lead Channel Pacing Threshold Amplitude: 0.5 V
Lead Channel Pacing Threshold Amplitude: 0.625 V
Lead Channel Pacing Threshold Pulse Width: 0.4 ms
Lead Channel Pacing Threshold Pulse Width: 0.4 ms
Lead Channel Sensing Intrinsic Amplitude: 1.375 mV
Lead Channel Sensing Intrinsic Amplitude: 1.5 mV
Lead Channel Sensing Intrinsic Amplitude: 12.125 mV
Lead Channel Sensing Intrinsic Amplitude: 12.125 mV
Lead Channel Setting Pacing Amplitude: 2 V
Lead Channel Setting Pacing Amplitude: 2.5 V
Lead Channel Setting Pacing Pulse Width: 0.4 ms
Lead Channel Setting Sensing Sensitivity: 2.8 mV

## 2019-06-22 NOTE — Progress Notes (Signed)
Wound check appointment. Dermabond wearing off. Wound without redness or edema. Incision edges approximated, wound well healed. Normal device function. Thresholds, sensing, and impedances stable with chronic leads. Pt underwent failed upgrade to CRT-P (unable to insert LV lead). No mode switches or high ventricular rates noted. ROV in 3 months with Dr. Caryl Comes.

## 2019-07-12 ENCOUNTER — Other Ambulatory Visit: Payer: Self-pay | Admitting: Physician Assistant

## 2019-07-16 ENCOUNTER — Telehealth: Payer: Self-pay | Admitting: Pulmonary Disease

## 2019-07-16 DIAGNOSIS — J9611 Chronic respiratory failure with hypoxia: Secondary | ICD-10-CM

## 2019-07-16 NOTE — Telephone Encounter (Signed)
Spoke to pt's spouse, Dorothyann Peng (DPR). Dorothyann Peng is requesting a Rx for inogen G 5, as pt wishes to purchase one.   DK please advise if okay to order? Former DS pt.

## 2019-07-16 NOTE — Telephone Encounter (Signed)
Order for inogen 5G has been placed.  Pt's spouse is aware. Nothing further is needed.

## 2019-07-16 NOTE — Telephone Encounter (Signed)
Yes

## 2019-07-20 ENCOUNTER — Encounter: Payer: Self-pay | Admitting: Cardiovascular Disease

## 2019-07-20 ENCOUNTER — Other Ambulatory Visit: Payer: Self-pay

## 2019-07-20 ENCOUNTER — Ambulatory Visit (INDEPENDENT_AMBULATORY_CARE_PROVIDER_SITE_OTHER): Payer: Medicare Other | Admitting: Cardiovascular Disease

## 2019-07-20 VITALS — BP 144/60 | HR 61 | Ht 61.5 in | Wt 124.5 lb

## 2019-07-20 DIAGNOSIS — I10 Essential (primary) hypertension: Secondary | ICD-10-CM | POA: Diagnosis not present

## 2019-07-20 DIAGNOSIS — I5042 Chronic combined systolic (congestive) and diastolic (congestive) heart failure: Secondary | ICD-10-CM | POA: Diagnosis not present

## 2019-07-20 DIAGNOSIS — I4811 Longstanding persistent atrial fibrillation: Secondary | ICD-10-CM | POA: Diagnosis not present

## 2019-07-20 DIAGNOSIS — Z953 Presence of xenogenic heart valve: Secondary | ICD-10-CM | POA: Diagnosis not present

## 2019-07-20 DIAGNOSIS — I2510 Atherosclerotic heart disease of native coronary artery without angina pectoris: Secondary | ICD-10-CM

## 2019-07-20 DIAGNOSIS — E785 Hyperlipidemia, unspecified: Secondary | ICD-10-CM

## 2019-07-20 NOTE — Patient Instructions (Signed)
Medication Instructions:  Your physician recommends that you continue on your current medications as directed. Please refer to the Current Medication list given to you today.  *If you need a refill on your cardiac medications before your next appointment, please call your pharmacy*  Lab Work: None ordered If you have labs (blood work) drawn today and your tests are completely normal, you will receive your results only by: . MyChart Message (if you have MyChart) OR . A paper copy in the mail If you have any lab test that is abnormal or we need to change your treatment, we will call you to review the results.  Testing/Procedures: None ordered  Follow-Up: At [facility], you and your health needs are our priority.  As part of our continuing mission to provide you with exceptional heart care, we have created designated Provider Care Teams.  These Care Teams include your primary Cardiologist (physician) and Advanced Practice Providers (APPs -  Physician Assistants and Nurse Practitioners) who all work together to provide you with the care you need, when you need it.  Your next appointment:   4 month(s)  The format for your next appointment:   In Person  Provider:    You may see Muhammad Arida, MD or one of the following Advanced Practice Providers on your designated Care Team:    Christopher Berge, NP  Ryan Dunn, PA-C  Jacquelyn Visser, PA-C   Other Instructions N/A  

## 2019-07-20 NOTE — Progress Notes (Signed)
Cardiology Office Note   Date:  07/20/2019   ID:  [name], DOB Jul 02, 1935, MRN [id number]  PCP:  Ezequiel Kayser, MD  Cardiologist:   Kathlyn Sacramento, MD   Chief Complaint  Patient presents with  . other    3 month f/u no complaints today. Meds reviewed verbally with pt.      History of Present Illness: [name] is a 84 y.o. female who presents for a follow-up visit regarding chronic systolic heart failure, atrial fibrillation and mitral regurgitation.  She has known history of COPD, breast cancer status post right partial mastectomy, hypothyroidism, hyperlipidemia and Mnire disease.   She underwent mitral valve replacement with a bioprosthetic valve, tricuspid valve repair, one-vessel CABG with LIMA to LAD and maze procedure in August 2016. This was complicated by sternal wound infection which required debridement. She also had complete heart block and underwent permanent pacemaker placement.  In 08/2017, she had worsening heart failure.  Echocardiogram showed an EF of 30 to 35%.She was seen by EP and it was felt that LV dysfunction was in the context of RV apical pacing. That was inactivated and she noted immediate improvement in symptoms. She again had worsening heart failure symptoms in late 2019.  She had a drop in EF to 20 to 25% in the setting of atrial fibrillation with RVR.  She was rate controlled and started on amiodarone and subsequently underwent cardioversion with restoration of sinus rhythm.  EF improved to 35 to 40% after that.   Due to continued symptoms of heart failure, right heart catheterization was done in June of this year which showed moderately elevated filling pressures, severe pulmonary hypertension and mildly reduced cardiac output.  There was no evidence of left-to-right intracardiac shunting by saturation run.  Prominent V waves were noted on pulmonary capillary wedge pressure tracing suggestive of significant mitral regurgitation.  Symptoms  improved after increasing furosemide.    She was hospitalized with COVID-19 infection in June but had mild disease overall.  She did have significant anemia which subsequently improved without transfusion. She underwent attempted upgrade of her pacemaker to a biventricular device but the procedure was not successful due to inability to place an LV lead.  She has been doing reasonably well and reports stable exertional dyspnea with no chest pain.  No significant leg edema.  Her anemia resolved.  Her heart failure symptoms improved significantly with the switch to [facility].    Past Medical History:  Diagnosis Date  . Anxiety   . Arthritis    "hands" (11/10/2015)  . CAD s/p CABG x 1    a. 04/2015 LIMA to LAD  . Chronic combined systolic (congestive) and diastolic (congestive) heart failure ([place])    a. 01/2015 Echo: EF 50-55%, Gr2 DD (preop valve surgery); b. 08/2015 Echo: EF 30-35%, diff HK. Gr2 DD; c. 09/2016 Echo: EF 50-55%. Nl fxning TV ring. Mildly dil LA.  Marland Kitchen COPD (chronic obstructive pulmonary disease) ([place])   . Coronary artery disease   . GERD (gastroesophageal reflux disease)   . History of colon polyps   . History of mitral valve replacement    a. 04/2015 s/p 27 mm [facility] Mitral bovine bioprosthetic tissue valve  . Hyperlipidemia   . Hypertension   . Hypothyroidism   . 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 04/2015 Complete bilateral atrial lesion set using cryothermy and bipolar radiofrequency ablation with clipping of LA appendage  . Meniere disease   .  Meniere's disease   . On home oxygen therapy    "2L at night" (11/10/2015)  . PAF (paroxysmal atrial fibrillation) ([place])   . Pneumonia ~ 2010  . PONV (postoperative nausea and vomiting)    Pt felt like eardrums were gonna pop  . Post-surgical complete heart block, symptomatic    a. 04/2015 s/p MDT [id number] Advisa DR MRI DC PPM.  . Presence of permanent cardiac pacemaker   . Pulmonary hypertension ([place])   . Surgical  wound, non healing - chest wall 11/10/2015  . Tricuspid Regurgitation s/p Repair    a. 04/2015 s/p 26 mm Edwards mc3 ring annuloplasty  . Wound infection 10/13/2015   Superficial sternal wound infection    Past Surgical History:  Procedure Laterality Date  . APPENDECTOMY    . APPLICATION OF A-CELL OF CHEST/ABDOMEN N/A 11/21/2015   Procedure: APPLICATION OF A-CELL OF CHEST/ABDOMEN;  Surgeon: Loel Lofty Dillingham, DO;  Location: [place];  Service: Plastics;  Laterality: N/A;  . APPLICATION OF A-CELL OF CHEST/ABDOMEN Right 12/21/2015   Procedure: APPLICATION OF A-CELL OF RIGHT CHEST;  Surgeon: Loel Lofty Dillingham, DO;  Location: [place];  Service: Plastics;  Laterality: Right;  . APPLICATION OF A-CELL OF CHEST/ABDOMEN Right 01/11/2016   Procedure: APPLICATION OF A-CELL OF RIGHT CHEST;  Surgeon: Loel Lofty Dillingham, DO;  Location: [place];  Service: Plastics;  Laterality: Right;  . APPLICATION OF A-CELL OF CHEST/ABDOMEN Right 02/12/2016   Procedure: APPLICATION OF A-CELL TO RIGHT CHEST WOUND;  Surgeon: Loel Lofty Dillingham, DO;  Location: [place];  Service: Plastics;  Laterality: Right;  . APPLICATION OF WOUND VAC N/A 05/09/2015   Procedure: APPLICATION OF WOUND VAC;  Surgeon: Rexene Alberts, MD;  Location: [place];  Service: Thoracic;  Laterality: N/A;  . APPLICATION OF WOUND VAC N/A 11/13/2015   Procedure: APPLICATION OF WOUND VAC;  Surgeon: Rexene Alberts, MD;  Location: [place];  Service: Thoracic;  Laterality: N/A;  . APPLICATION OF WOUND VAC N/A 11/21/2015   Procedure: APPLICATION OF WOUND VAC;  Surgeon: Loel Lofty Dillingham, DO;  Location: [place];  Service: Plastics;  Laterality: N/A;  . APPLICATION OF WOUND VAC Right 12/21/2015   Procedure: APPLICATION OF WOUND VAC to right chest wall ;  Surgeon: Loel Lofty Dillingham, DO;  Location: [place];  Service: Plastics;  Laterality: Right;  . APPLICATION OF WOUND VAC Right 01/11/2016   Procedure: APPLICATION OF WOUND VAC RIGHT CHEST ;  Surgeon: Loel Lofty Dillingham, DO;   Location: [place];  Service: Plastics;  Laterality: Right;  . APPLICATION OF WOUND VAC Right 01/24/2016   Procedure: APPLICATION OF WOUND VAC RIGHT CHEST WALL;  Surgeon: Loel Lofty Dillingham, DO;  Location: [place];  Service: Plastics;  Laterality: Right;  . APPLICATION OF WOUND VAC Right 02/12/2016   Procedure: RE-APPLICATION OF WOUND VAC TO RIGHT CHEST WOUND;  Surgeon: Loel Lofty Dillingham, DO;  Location: [place];  Service: Plastics;  Laterality: Right;  . BIV UPGRADE N/A 06/07/2019   Procedure: BIV UPGRADE;  Surgeon: Deboraha Sprang, MD;  Location: [place] [facility];  Service: Cardiovascular;  Laterality: N/A;  . BREAST BIOPSY Left 11/25/06   neg  . BREAST BIOPSY Left 01/20/12   /clip-neg  . CARDIAC CATHETERIZATION  11/2013   [facility]  . CARDIAC CATHETERIZATION  10/2014   [facility]  . CARDIAC VALVE REPLACEMENT    . CARDIOVERSION N/A 07/20/2018   Procedure: CARDIOVERSION;  Surgeon: Wellington Hampshire, MD;  Location: [facility];  Service: Cardiovascular;  Laterality:  N/A;  . CATARACT EXTRACTION W/ INTRAOCULAR LENS  IMPLANT, BILATERAL Bilateral 2014  . CLIPPING OF ATRIAL APPENDAGE N/A 04/25/2015   Procedure: CLIPPING OF ATRIAL APPENDAGE;  Surgeon: Rexene Alberts, MD;  Location: [place];  Service: Open Heart Surgery;  Laterality: N/A;  . COCHLEAR IMPLANT Left 2005?  . COLONOSCOPY WITH PROPOFOL N/A 01/20/2018   Procedure: COLONOSCOPY WITH PROPOFOL;  Surgeon: Toledo, Benay Pike, MD;  Location: [facility];  Service: Gastroenterology;  Laterality: N/A;  . CORONARY ANGIOPLASTY    . CORONARY ARTERY BYPASS GRAFT N/A 04/25/2015   Procedure: CORONARY ARTERY BYPASS GRAFTING (CABG) x ONE, using left internal mammary artery;  Surgeon: Rexene Alberts, MD;  Location: [place];  Service: Open Heart Surgery;  Laterality: N/A;  . DILATION AND CURETTAGE OF UTERUS  "several before hysterectomy"  . EP IMPLANTABLE DEVICE N/A 05/02/2015   Procedure: Pacemaker Implant;  Surgeon: Thompson Grayer, MD;  Location: [place] [facility];  Service:  Cardiovascular;  Laterality: N/A;  . ESOPHAGOGASTRODUODENOSCOPY (EGD) WITH PROPOFOL N/A 01/20/2018   Procedure: ESOPHAGOGASTRODUODENOSCOPY (EGD) WITH PROPOFOL;  Surgeon: Toledo, Benay Pike, MD;  Location: [facility];  Service: Gastroenterology;  Laterality: N/A;  . EYE SURGERY    . I&D EXTREMITY Right 12/06/2015   Procedure: IRRIGATION AND DEBRIDEMENT RIGHT CHEST WALL WITH ACELL PLACEMENT AND VAC;  Surgeon: Loel Lofty Dillingham, DO;  Location: [place];  Service: Plastics;  Laterality: Right;  . INCISION AND DRAINAGE OF WOUND N/A 11/21/2015   Procedure: IRRIGATION AND DEBRIDEMENT WOUND;  Surgeon: Loel Lofty Dillingham, DO;  Location: [place];  Service: Plastics;  Laterality: N/A;  . INCISION AND DRAINAGE OF WOUND Right 12/21/2015   Procedure: IRRIGATION AND DEBRIDEMENT right chest wall WOUND;  Surgeon: Loel Lofty Dillingham, DO;  Location: [place];  Service: Plastics;  Laterality: Right;  right chest wall   . INCISION AND DRAINAGE OF WOUND Right 01/24/2016   Procedure: IRRIGATION AND DEBRIDEMENT RIGHT CHEST WALL WOUND;  Surgeon: Loel Lofty Dillingham, DO;  Location: [place];  Service: Plastics;  Laterality: Right;  . INCISION AND DRAINAGE OF WOUND Right 03/28/2016   Procedure: IRRIGATION AND DEBRIDEMENT RIGHT CHEST WALL WOUND WITH A Cell Placement;  Surgeon: Loel Lofty Dillingham, DO;  Location: [place];  Service: Plastics;  Laterality: Right;  . INCISION AND DRAINAGE OF WOUND Right 05/17/2016   Procedure: IRRIGATION AND DEBRIDEMENT OF RIGHT CHEST WOUND WITH A CELL PLACEMENT;  Surgeon: Loel Lofty Dillingham, DO;  Location: [place];  Service: Plastics;  Laterality: Right;  . INSERT / REPLACE / REMOVE PACEMAKER    . IRRIGATION AND DEBRIDEMENT OF WOUND WITH SPLIT THICKNESS SKIN GRAFT Right 01/11/2016   Procedure: IRRIGATION AND DEBRIDEMENT OF RIGHT CHEST WOUND ;  Surgeon: Loel Lofty Dillingham, DO;  Location: [place];  Service: Plastics;  Laterality: Right;  . MASTECTOMY, PARTIAL Right 2002   positive  . MAZE N/A 04/25/2015    Procedure: MAZE;  Surgeon: Rexene Alberts, MD;  Location: [place];  Service: Open Heart Surgery;  Laterality: N/A;  . MITRAL VALVE REPAIR N/A 04/25/2015   Procedure: MITRAL VALVE  REPLACEMENT using a 27 mm Edwards Perimount Magna Mitral Ease Valve;  Surgeon: Rexene Alberts, MD;  Location: [place];  Service: Open Heart Surgery;  Laterality: N/A;  . RIGHT HEART CATH N/A 02/08/2019   Procedure: RIGHT HEART CATH;  Surgeon: Wellington Hampshire, MD;  Location: [place] [facility];  Service: Cardiovascular;  Laterality: N/A;  . SKIN SPLIT GRAFT Right 02/12/2016   Procedure: IRRIGATION AND  DEBRIDEMENT RIGHT CHEST WOUND;  Surgeon: Loel Lofty Dillingham, DO;  Location: [place];  Service: Plastics;  Laterality: Right;  . STERNAL WIRES REMOVAL N/A 06/05/2015   Procedure: STERNAL WIRES REMOVAL;  Surgeon: Rexene Alberts, MD;  Location: [place];  Service: Thoracic;  Laterality: N/A;  . STERNAL WOUND DEBRIDEMENT N/A 05/09/2015   Procedure: STERNAL WOUND DEBRIDEMENT;  Surgeon: Rexene Alberts, MD;  Location: [place];  Service: Thoracic;  Laterality: N/A;  . STERNAL WOUND DEBRIDEMENT N/A 11/13/2015   Procedure: Excisional drainage of RIGHT Chest wall mass and breast mass ;  Surgeon: Rexene Alberts, MD;  Location: [place];  Service: Thoracic;  Laterality: N/A;  . TEE WITHOUT CARDIOVERSION N/A 04/25/2015   Procedure: TRANSESOPHAGEAL ECHOCARDIOGRAM (TEE);  Surgeon: Rexene Alberts, MD;  Location: [place];  Service: Open Heart Surgery;  Laterality: N/A;  . TONSILLECTOMY    . TRAM Right 11/18/2015   Procedure: VRAM Vertical Rectus Abdominus Muscle Flap;  Surgeon: Loel Lofty Dillingham, DO;  Location: [place];  Service: Plastics;  Laterality: Right;  RIght Back  . TRICUSPID VALVE REPLACEMENT N/A 04/25/2015   Procedure: TRICUSPID VALVE REPAIR;  Surgeon: Rexene Alberts, MD;  Location: [place];  Service: Open Heart Surgery;  Laterality: N/A;  . VAGINAL HYSTERECTOMY       Current Outpatient Medications  Medication Sig Dispense Refill  .  acetaminophen (TYLENOL) 500 MG tablet Take 1,000 mg by mouth every 6 (six) hours as needed (for pain.).    Marland Kitchen AMBULATORY NON FORMULARY MEDICATION [facility] at Home Oxygen machine with tubing. DX: COPD DX Code: J44.9 1 each 0  . amiodarone (PACERONE) 200 MG tablet Take 1/2 tablet (100 mg) by mouth once daily    . aspirin EC 81 MG EC tablet Take 1 tablet (81 mg total) by mouth daily.    . bisoprolol (ZEBETA) 5 MG tablet Take 1/2 tablet (2.5 mg) by mouth once daily    . diphenhydrAMINE (BENADRYL) 25 mg capsule Take 25 mg by mouth every 6 (six) hours as needed for allergies.    Marland Kitchen docusate sodium (COLACE) 100 MG capsule Take 100 mg by mouth 2 (two) times daily as needed (constipation.).    Marland Kitchen ENTRESTO 24-26 MG TAKE 1 TABLET BY MOUTH TWICE A DAY (Patient taking differently: Take 1 tablet by mouth 2 (two) times daily. ) 60 tablet 6  . furosemide (LASIX) 20 MG tablet Take 20-40 mg by mouth See admin instructions. Take 2 tablets (40 mg) by mouth in the morning & take 1 tablet (20 mg) by mouth in the afternoon.    . gabapentin (NEURONTIN) 100 MG capsule Take 200 mg by mouth at bedtime.   1  . ipratropium-albuterol (DUONEB) 0.5-2.5 (3) MG/3ML SOLN Take 3 mLs by nebulization every 6 (six) hours as needed. DX:J44.9 (Patient taking differently: Take 3 mLs by nebulization every 6 (six) hours as needed (with cold symptoms.). DX:J44.9) 1080 mL 1  . levothyroxine (SYNTHROID, LEVOTHROID) 88 MCG tablet Take 88 mcg by mouth daily before breakfast.    . Multiple Vitamins-Minerals (PRESERVISION AREDS 2 PO) Take 1 tablet by mouth 2 (two) times daily.     . pantoprazole (PROTONIX) 40 MG tablet Take 40 mg by mouth 2 (two) times daily.     . simvastatin (ZOCOR) 10 MG tablet TAKE 1 TABLET BY MOUTH EVERYDAY AT BEDTIME 90 tablet 0  . spironolactone (ALDACTONE) 25 MG tablet Take 0.5 tablets (12.5 mg total) by mouth daily. 45 tablet 3  . warfarin (COUMADIN) 2.5  MG tablet TAKE AS DIRECTED BY COUMADIN CLINIC (Patient taking differently:  Take 2.5-3.75 mg by mouth See admin instructions. Take 1.5 tablets (3.75 mg) by mouth in the evenings on Mondays, Wednesdays, & Fridays. Take 1 tablet (2.5 mg) by mouth in the evening on Sundays, Tuesdays, Thursdays, & Saturdays.) 165 tablet 0   No current facility-administered medications for this visit.     Allergies:   Ace inhibitors, Amoxicillin-pot clavulanate, Penicillins, Nifedipine, Propranolol, Diazepam, Meperidine, and Propoxyphene    Social History:  The patient  reports that she quit smoking about 21 years ago. Her smoking use included cigarettes. She has a 60.00 pack-year smoking history. She has never used smokeless tobacco. She reports current alcohol use. She reports that she does not use drugs.   Family History:  The patient's family history includes Breast cancer in her maternal aunt and paternal aunt; CVA in her mother; Heart attack in her paternal uncle; Heart disease in her brother and father.    ROS:  Please see the history of present illness.   Otherwise, review of systems are positive for none.   All other systems are reviewed and negative.    PHYSICAL EXAM: VS:  BP (!) 144/60 (BP Location: Left Arm, Patient Position: Sitting, Cuff Size: Normal)   Pulse 61   Ht 5' 1.5" (1.562 m)   Wt 124 lb 8 oz (56.5 kg)   SpO2 96%   BMI 23.14 kg/m  , BMI Body mass index is 23.14 kg/m. GEN: Well nourished, well developed, in no acute distress  HEENT: normal  Neck: no JVD, carotid bruits, or masses Cardiac: RRR; no murmurs, rubs, or gallops, trace edema  Respiratory:  clear to auscultation bilaterally, normal work of breathing GI: soft, nontender, nondistended, + BS MS: no deformity or atrophy  Skin: warm and dry, no rash Neuro:  Strength and sensation are intact Psych: euthymic mood, full affect   EKG:  EKG is ordered today. The ekg ordered today demonstrates junctional rhythm with left axis deviation and nonspecific ST and T wave changes.   Recent Labs: 02/22/2019:  B Natriuretic Peptide 806.0 02/26/2019: Magnesium 2.5 03/16/2019: ALT 16; TSH 1.360 06/03/2019: BUN 20; Creatinine, Ser 0.74; Hemoglobin 11.4; Platelets 156; Potassium 4.2; Sodium 145    Lipid Panel    Component Value Date/Time   TRIG 48 02/22/2019 1100      Wt Readings from Last 3 Encounters:  07/20/19 124 lb 8 oz (56.5 kg)  06/07/19 120 lb (54.4 kg)  05/06/19 119 lb (54 kg)       PAD Screen 04/05/2016  Previous PAD dx? No  Previous surgical procedure? No  Pain with walking? No  Feet/toe relief with dangling? No  Painful, non-healing ulcers? No  Extremities discolored? No      ASSESSMENT AND PLAN:   1.  Chronic systolic heart failure: She appears to be euvolemic on current dose of furosemide.  She is on optimal medical therapy including  Entresto, bisoprolol and spironolactone. Blood pressure is mildly elevated today but her blood pressure is usually on the low side and thus I made no changes in her medications.  2. Status post mitral valve replacement with a bioprosthetic valve: This was intact on most recent echocardiogram.  3. Coronary artery disease status post one-vessel LIMA to LAD: No anginal symptoms  4. Persistent atrial fibrillation: She seems to be maintaining in sinus rhythm with amiodarone.  Continue long-term anticoagulation with warfarin.  5.  Hyperlipidemia: Currently on simvastatin.  6.  Status post permanent  pacemaker placement: Followed by Dr. Caryl Comes.  Unsuccessful upgrade to biventricular pacemaker due to inability to place an LV lead.    Disposition:   FU with me in 4 months  Signed,  Kathlyn Sacramento, MD  07/20/2019 4:16 PM    [facility]

## 2019-07-21 ENCOUNTER — Ambulatory Visit (INDEPENDENT_AMBULATORY_CARE_PROVIDER_SITE_OTHER): Payer: Medicare Other

## 2019-07-21 DIAGNOSIS — Z953 Presence of xenogenic heart valve: Secondary | ICD-10-CM

## 2019-07-21 DIAGNOSIS — Z5181 Encounter for therapeutic drug level monitoring: Secondary | ICD-10-CM

## 2019-07-21 DIAGNOSIS — I340 Nonrheumatic mitral (valve) insufficiency: Secondary | ICD-10-CM | POA: Diagnosis not present

## 2019-07-21 LAB — POCT INR: INR: 2 (ref 2.0–3.0)

## 2019-07-21 NOTE — Patient Instructions (Signed)
Please continue dosage of 1 tablet every day EXCEPT 1.5 TABLETS ON MONDAYS, [place]. Recheck INR in 6 weeks.

## 2019-07-26 ENCOUNTER — Other Ambulatory Visit: Payer: Self-pay | Admitting: Internal Medicine

## 2019-07-26 DIAGNOSIS — Z1231 Encounter for screening mammogram for malignant neoplasm of breast: Secondary | ICD-10-CM

## 2019-07-31 DIAGNOSIS — F5101 Primary insomnia: Secondary | ICD-10-CM | POA: Insufficient documentation

## 2019-08-10 ENCOUNTER — Other Ambulatory Visit: Payer: Self-pay

## 2019-08-10 ENCOUNTER — Ambulatory Visit
Admission: RE | Admit: 2019-08-10 | Discharge: 2019-08-10 | Disposition: A | Discharge status: Home / Self Care | Payer: Medicare Other | Source: Ambulatory Visit | Attending: Internal Medicine | Admitting: Internal Medicine

## 2019-08-10 DIAGNOSIS — Z1231 Encounter for screening mammogram for malignant neoplasm of breast: Secondary | ICD-10-CM

## 2019-08-10 HISTORY — DX: Personal history of irradiation: Z92.3

## 2019-08-26 ENCOUNTER — Other Ambulatory Visit: Payer: Medicare Other

## 2019-08-28 ENCOUNTER — Other Ambulatory Visit: Payer: Self-pay | Admitting: Physician Assistant

## 2019-09-01 ENCOUNTER — Other Ambulatory Visit: Payer: Self-pay

## 2019-09-01 ENCOUNTER — Ambulatory Visit (INDEPENDENT_AMBULATORY_CARE_PROVIDER_SITE_OTHER): Payer: Medicare Other

## 2019-09-01 DIAGNOSIS — Z953 Presence of xenogenic heart valve: Secondary | ICD-10-CM

## 2019-09-01 DIAGNOSIS — Z5181 Encounter for therapeutic drug level monitoring: Secondary | ICD-10-CM

## 2019-09-01 DIAGNOSIS — I340 Nonrheumatic mitral (valve) insufficiency: Secondary | ICD-10-CM | POA: Diagnosis not present

## 2019-09-01 LAB — POCT INR: INR: 2.1 (ref 2.0–3.0)

## 2019-09-01 NOTE — Patient Instructions (Signed)
Please continue dosage of 1 tablet every day EXCEPT 1.5 TABLETS ON MONDAYS, [place]. Recheck INR in 6 weeks.

## 2019-09-09 ENCOUNTER — Ambulatory Visit (INDEPENDENT_AMBULATORY_CARE_PROVIDER_SITE_OTHER): Payer: Medicare Other | Admitting: *Deleted

## 2019-09-09 DIAGNOSIS — R001 Bradycardia, unspecified: Secondary | ICD-10-CM

## 2019-09-09 LAB — CUP PACEART REMOTE DEVICE CHECK
Battery Remaining Longevity: 62 mo
Battery Voltage: 3 V
Brady Statistic AP VP Percent: 0.14 %
Brady Statistic AP VS Percent: 77.5 %
Brady Statistic AS VP Percent: 10.5 %
Brady Statistic AS VS Percent: 11.86 %
Brady Statistic RA Percent Paced: 75.26 %
Brady Statistic RV Percent Paced: 10.51 %
Date Time Interrogation Session: 20201231100816
Implantable Lead Implant Date: 20160823
Implantable Lead Implant Date: 20160823
Implantable Lead Location: 753859
Implantable Lead Location: 753860
Implantable Lead Model: 5076
Implantable Lead Model: 5076
Implantable Pulse Generator Implant Date: 20160823
Lead Channel Impedance Value: 342 Ohm
Lead Channel Impedance Value: 399 Ohm
Lead Channel Impedance Value: 418 Ohm
Lead Channel Impedance Value: 437 Ohm
Lead Channel Pacing Threshold Amplitude: 0.5 V
Lead Channel Pacing Threshold Amplitude: 0.625 V
Lead Channel Pacing Threshold Pulse Width: 0.4 ms
Lead Channel Pacing Threshold Pulse Width: 0.4 ms
Lead Channel Sensing Intrinsic Amplitude: 1.75 mV
Lead Channel Sensing Intrinsic Amplitude: 1.75 mV
Lead Channel Sensing Intrinsic Amplitude: 11 mV
Lead Channel Sensing Intrinsic Amplitude: 11 mV
Lead Channel Setting Pacing Amplitude: 2 V
Lead Channel Setting Pacing Amplitude: 2.5 V
Lead Channel Setting Pacing Pulse Width: 0.4 ms
Lead Channel Setting Sensing Sensitivity: 2.8 mV

## 2019-09-10 NOTE — Progress Notes (Signed)
PPM remote 

## 2019-09-14 ENCOUNTER — Encounter: Payer: Self-pay | Admitting: Internal Medicine

## 2019-09-14 ENCOUNTER — Other Ambulatory Visit: Payer: Self-pay

## 2019-09-14 ENCOUNTER — Ambulatory Visit (INDEPENDENT_AMBULATORY_CARE_PROVIDER_SITE_OTHER): Payer: Medicare Other | Admitting: Internal Medicine

## 2019-09-14 VITALS — BP 122/60 | HR 64 | Resp 16 | Ht 62.0 in | Wt 126.5 lb

## 2019-09-14 DIAGNOSIS — Z79899 Other long term (current) drug therapy: Secondary | ICD-10-CM

## 2019-09-14 DIAGNOSIS — Z950 Presence of cardiac pacemaker: Secondary | ICD-10-CM

## 2019-09-14 DIAGNOSIS — I480 Paroxysmal atrial fibrillation: Secondary | ICD-10-CM

## 2019-09-14 DIAGNOSIS — I495 Sick sinus syndrome: Secondary | ICD-10-CM

## 2019-09-14 DIAGNOSIS — I442 Atrioventricular block, complete: Secondary | ICD-10-CM

## 2019-09-14 NOTE — Patient Instructions (Signed)
Medication Instructions:  - Your physician recommends that you continue on your current medications as directed. Please refer to the Current Medication list given to you today.  *If you need a refill on your cardiac medications before your next appointment, please call your pharmacy*  Lab Work: - Your physician recommends that you have lab work today: Liver  If you have labs (blood work) drawn today and your tests are completely normal, you will receive your results only by: Marland Kitchen MyChart Message (if you have MyChart) OR . A paper copy in the mail If you have any lab test that is abnormal or we need to change your treatment, we will call you to review the results.  Testing/Procedures: - none ordered  Follow-Up: At [facility], you and your health needs are our priority.  As part of our continuing mission to provide you with exceptional heart care, we have created designated Provider Care Teams.  These Care Teams include your primary Cardiologist (physician) and Advanced Practice Providers (APPs -  Physician Assistants and Nurse Practitioners) who all work together to provide you with the care you need, when you need it.  Your next appointment:   6 month(s)  The format for your next appointment:   In Person  Provider:   Virl Axe, MD  Other Instructions n/a

## 2019-09-14 NOTE — Progress Notes (Signed)
See reply Patient Care Team: Ezequiel Kayser, MD as PCP - General (Internal Medicine) Wellington Hampshire, MD as PCP - Cardiology (Cardiology) Deboraha Sprang, MD as PCP - Electrophysiology (Cardiology) Alisa Graff, FNP as Nurse Practitioner (Family Medicine)   HPI  [name] is a 85 y.o. female Seen in follow-up for a pacemaker Medtronic implanted 8/16 [facility])  in the context of multi-valvular surgery bioprosthetic mitral valve replacement, tricuspid valve repair and MAZE operation w postoperative complete heart block; CRT upgrade attempted and failed  9/20   Persistent atrial fibrillation;  anticoagulated with Coumadin and aspirin and Rx with amio (started 11/19)  DATE TEST EF   5/16 Echo   65 %   12/16 Echo   30-35 %   1/18 Echo  55%   11/19 Echo  20-25%   12/19 Echo  35%   7/20 Echo  35%   Previous complaints of exercise intolerance improved with decreasing rate response slope and ADL rate.      Hospitalized 6/20 with increasing shortness of breath with COVID positive.     Increasing AFib burden  \ Breathing has been relatively stable.  She is on chronic oxygen.  No edema.  No chest pain.       Date Cr K Hgb TSH LFTs  8/17     9.2    5/18 0.8  11.5    5/19 0.8 3.9 13.5 (MCV 103<<89)    11/19 0.8 3.9 9.9 2.168 43-  6/20 0.84 4.9 8.0 2.860 (2/20) 51  11/20 0.74 4.2 11.8 2.119      Hx of recurrent anemia and has had Fe replacement, but not sure if there is a specific diagnosis    Past Medical History:  Diagnosis Date  . Anxiety   . Arthritis    "hands" (11/10/2015)  . Breast cancer ([place]) 2002   right  . CAD s/p CABG x 1    a. 04/2015 [place] to LAD  . Chronic combined systolic (congestive) and diastolic (congestive) heart failure ([place])    a. 01/2015 Echo: EF 50-55%, Gr2 DD (preop valve surgery); b. 08/2015 Echo: EF 30-35%, diff HK. Gr2 DD; c. 09/2016 Echo: EF 50-55%. Nl fxning TV ring. Mildly dil LA.  Marland Kitchen COPD (chronic obstructive pulmonary disease) ([place])     . Coronary artery disease   . GERD (gastroesophageal reflux disease)   . History of colon polyps   . History of mitral valve replacement    a. 04/2015 s/p 27 mm [facility] Mitral bovine bioprosthetic tissue valve  . Hyperlipidemia   . Hypertension   . Hypothyroidism   . 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 04/2015 Complete bilateral atrial lesion set using cryothermy and bipolar radiofrequency ablation with clipping of LA appendage  . Meniere disease   . Meniere's disease   . On home oxygen therapy    "2L at night" (11/10/2015)  . PAF (paroxysmal atrial fibrillation) ([place])   . Personal history of radiation therapy   . Pneumonia ~ 2010  . PONV (postoperative nausea and vomiting)    Pt felt like eardrums were gonna pop  . Post-surgical complete heart block, symptomatic    a. 04/2015 s/p MDT [id number] Advisa DR MRI DC PPM.  . Presence of permanent cardiac pacemaker   . Pulmonary hypertension ([place])   . Surgical wound, non healing - chest wall 11/10/2015  . Tricuspid Regurgitation s/p Repair    a. 04/2015 s/p 26 mm  Edwards mc3 ring annuloplasty  . Wound infection 10/13/2015   Superficial sternal wound infection    Past Surgical History:  Procedure Laterality Date  . APPENDECTOMY    . APPLICATION OF A-CELL OF CHEST/ABDOMEN N/A 11/21/2015   Procedure: APPLICATION OF A-CELL OF CHEST/ABDOMEN;  Surgeon: Loel Lofty Dillingham, DO;  Location: [place];  Service: Plastics;  Laterality: N/A;  . APPLICATION OF A-CELL OF CHEST/ABDOMEN Right 12/21/2015   Procedure: APPLICATION OF A-CELL OF RIGHT CHEST;  Surgeon: Loel Lofty Dillingham, DO;  Location: [place];  Service: Plastics;  Laterality: Right;  . APPLICATION OF A-CELL OF CHEST/ABDOMEN Right 01/11/2016   Procedure: APPLICATION OF A-CELL OF RIGHT CHEST;  Surgeon: Loel Lofty Dillingham, DO;  Location: [place];  Service: Plastics;  Laterality: Right;  . APPLICATION OF A-CELL OF CHEST/ABDOMEN Right 02/12/2016   Procedure: APPLICATION OF A-CELL TO RIGHT CHEST  WOUND;  Surgeon: Loel Lofty Dillingham, DO;  Location: [place];  Service: Plastics;  Laterality: Right;  . APPLICATION OF WOUND VAC N/A 05/09/2015   Procedure: APPLICATION OF WOUND VAC;  Surgeon: Rexene Alberts, MD;  Location: [place];  Service: Thoracic;  Laterality: N/A;  . APPLICATION OF WOUND VAC N/A 11/13/2015   Procedure: APPLICATION OF WOUND VAC;  Surgeon: Rexene Alberts, MD;  Location: [place];  Service: Thoracic;  Laterality: N/A;  . APPLICATION OF WOUND VAC N/A 11/21/2015   Procedure: APPLICATION OF WOUND VAC;  Surgeon: Loel Lofty Dillingham, DO;  Location: [place];  Service: Plastics;  Laterality: N/A;  . APPLICATION OF WOUND VAC Right 12/21/2015   Procedure: APPLICATION OF WOUND VAC to right chest wall ;  Surgeon: Loel Lofty Dillingham, DO;  Location: [place];  Service: Plastics;  Laterality: Right;  . APPLICATION OF WOUND VAC Right 01/11/2016   Procedure: APPLICATION OF WOUND VAC RIGHT CHEST ;  Surgeon: Loel Lofty Dillingham, DO;  Location: [place];  Service: Plastics;  Laterality: Right;  . APPLICATION OF WOUND VAC Right 01/24/2016   Procedure: APPLICATION OF WOUND VAC RIGHT CHEST WALL;  Surgeon: Loel Lofty Dillingham, DO;  Location: [place];  Service: Plastics;  Laterality: Right;  . APPLICATION OF WOUND VAC Right 02/12/2016   Procedure: RE-APPLICATION OF WOUND VAC TO RIGHT CHEST WOUND;  Surgeon: Loel Lofty Dillingham, DO;  Location: [place];  Service: Plastics;  Laterality: Right;  . BIV UPGRADE N/A 06/07/2019   Procedure: BIV UPGRADE;  Surgeon: Deboraha Sprang, MD;  Location: [place] [facility];  Service: Cardiovascular;  Laterality: N/A;  . BREAST BIOPSY Left 11/25/06   neg  . BREAST BIOPSY Left 01/20/12   /clip-neg  . CARDIAC CATHETERIZATION  11/2013   [facility]  . CARDIAC CATHETERIZATION  10/2014   [facility]  . CARDIAC VALVE REPLACEMENT    . CARDIOVERSION N/A 07/20/2018   Procedure: CARDIOVERSION;  Surgeon: Wellington Hampshire, MD;  Location: [facility];  Service: Cardiovascular;  Laterality: N/A;  . CATARACT EXTRACTION W/  INTRAOCULAR LENS  IMPLANT, BILATERAL Bilateral 2014  . CLIPPING OF ATRIAL APPENDAGE N/A 04/25/2015   Procedure: CLIPPING OF ATRIAL APPENDAGE;  Surgeon: Rexene Alberts, MD;  Location: [place];  Service: Open Heart Surgery;  Laterality: N/A;  . COCHLEAR IMPLANT Left 2005?  . COLONOSCOPY WITH PROPOFOL N/A 01/20/2018   Procedure: COLONOSCOPY WITH PROPOFOL;  Surgeon: [place], Benay Pike, MD;  Location: [facility];  Service: Gastroenterology;  Laterality: N/A;  . CORONARY ANGIOPLASTY    . CORONARY ARTERY BYPASS GRAFT N/A 04/25/2015   Procedure: CORONARY ARTERY BYPASS GRAFTING (CABG) x  ONE, using left internal mammary artery;  Surgeon: Rexene Alberts, MD;  Location: [place];  Service: Open Heart Surgery;  Laterality: N/A;  . DILATION AND CURETTAGE OF UTERUS  "several before hysterectomy"  . EP IMPLANTABLE DEVICE N/A 05/02/2015   Procedure: Pacemaker Implant;  Surgeon: Thompson Grayer, MD;  Location: [place] [facility];  Service: Cardiovascular;  Laterality: N/A;  . ESOPHAGOGASTRODUODENOSCOPY (EGD) WITH PROPOFOL N/A 01/20/2018   Procedure: ESOPHAGOGASTRODUODENOSCOPY (EGD) WITH PROPOFOL;  Surgeon: [place], Benay Pike, MD;  Location: [facility];  Service: Gastroenterology;  Laterality: N/A;  . EYE SURGERY    . I & D EXTREMITY Right 12/06/2015   Procedure: IRRIGATION AND DEBRIDEMENT RIGHT CHEST WALL WITH ACELL PLACEMENT AND VAC;  Surgeon: Loel Lofty Dillingham, DO;  Location: [place];  Service: Plastics;  Laterality: Right;  . INCISION AND DRAINAGE OF WOUND N/A 11/21/2015   Procedure: IRRIGATION AND DEBRIDEMENT WOUND;  Surgeon: Loel Lofty Dillingham, DO;  Location: [place];  Service: Plastics;  Laterality: N/A;  . INCISION AND DRAINAGE OF WOUND Right 12/21/2015   Procedure: IRRIGATION AND DEBRIDEMENT right chest wall WOUND;  Surgeon: Loel Lofty Dillingham, DO;  Location: [place];  Service: Plastics;  Laterality: Right;  right chest wall   . INCISION AND DRAINAGE OF WOUND Right 01/24/2016   Procedure: IRRIGATION AND DEBRIDEMENT  RIGHT CHEST WALL WOUND;  Surgeon: Loel Lofty Dillingham, DO;  Location: [place];  Service: Plastics;  Laterality: Right;  . INCISION AND DRAINAGE OF WOUND Right 03/28/2016   Procedure: IRRIGATION AND DEBRIDEMENT RIGHT CHEST WALL WOUND WITH A Cell Placement;  Surgeon: Loel Lofty Dillingham, DO;  Location: [place];  Service: Plastics;  Laterality: Right;  . INCISION AND DRAINAGE OF WOUND Right 05/17/2016   Procedure: IRRIGATION AND DEBRIDEMENT OF RIGHT CHEST WOUND WITH A CELL PLACEMENT;  Surgeon: Loel Lofty Dillingham, DO;  Location: [place];  Service: Plastics;  Laterality: Right;  . INSERT / REPLACE / REMOVE PACEMAKER    . IRRIGATION AND DEBRIDEMENT OF WOUND WITH SPLIT THICKNESS SKIN GRAFT Right 01/11/2016   Procedure: IRRIGATION AND DEBRIDEMENT OF RIGHT CHEST WOUND ;  Surgeon: Loel Lofty Dillingham, DO;  Location: [place];  Service: Plastics;  Laterality: Right;  . MASTECTOMY, PARTIAL Right 2002   positive  . MAZE N/A 04/25/2015   Procedure: MAZE;  Surgeon: Rexene Alberts, MD;  Location: [place];  Service: Open Heart Surgery;  Laterality: N/A;  . MITRAL VALVE REPAIR N/A 04/25/2015   Procedure: MITRAL VALVE  REPLACEMENT using a 27 mm Edwards Perimount Magna Mitral Ease Valve;  Surgeon: Rexene Alberts, MD;  Location: [place];  Service: Open Heart Surgery;  Laterality: N/A;  . RIGHT HEART CATH N/A 02/08/2019   Procedure: RIGHT HEART CATH;  Surgeon: Wellington Hampshire, MD;  Location: [place] [facility];  Service: Cardiovascular;  Laterality: N/A;  . SKIN SPLIT GRAFT Right 02/12/2016   Procedure: IRRIGATION AND DEBRIDEMENT RIGHT CHEST WOUND;  Surgeon: Loel Lofty Dillingham, DO;  Location: [place];  Service: Plastics;  Laterality: Right;  . STERNAL WIRES REMOVAL N/A 06/05/2015   Procedure: STERNAL WIRES REMOVAL;  Surgeon: Rexene Alberts, MD;  Location: [place];  Service: Thoracic;  Laterality: N/A;  . STERNAL WOUND DEBRIDEMENT N/A 05/09/2015   Procedure: STERNAL WOUND DEBRIDEMENT;  Surgeon: Rexene Alberts, MD;  Location: [place];   Service: Thoracic;  Laterality: N/A;  . STERNAL WOUND DEBRIDEMENT N/A 11/13/2015   Procedure: Excisional drainage of RIGHT Chest wall mass and breast mass ;  Surgeon: Braulio Conte  Keturah Barre, MD;  Location: [place];  Service: Thoracic;  Laterality: N/A;  . TEE WITHOUT CARDIOVERSION N/A 04/25/2015   Procedure: TRANSESOPHAGEAL ECHOCARDIOGRAM (TEE);  Surgeon: Rexene Alberts, MD;  Location: [place];  Service: Open Heart Surgery;  Laterality: N/A;  . TONSILLECTOMY    . TRAM Right 11/18/2015   Procedure: VRAM Vertical Rectus Abdominus Muscle Flap;  Surgeon: Loel Lofty Dillingham, DO;  Location: [place];  Service: Plastics;  Laterality: Right;  RIght Back  . TRICUSPID VALVE REPLACEMENT N/A 04/25/2015   Procedure: TRICUSPID VALVE REPAIR;  Surgeon: Rexene Alberts, MD;  Location: [place];  Service: Open Heart Surgery;  Laterality: N/A;  . VAGINAL HYSTERECTOMY      Current Outpatient Medications  Medication Sig Dispense Refill  . acetaminophen (TYLENOL) 500 MG tablet Take 1,000 mg by mouth every 6 (six) hours as needed (for pain.).    Marland Kitchen AMBULATORY NON FORMULARY MEDICATION Inogen at Home Oxygen machine with tubing. DX: COPD DX Code: J44.9 1 each 0  . amiodarone (PACERONE) 200 MG tablet TAKE 1/2 TABLET BY MOUTH EVERY DAY 45 tablet 0  . aspirin EC 81 MG EC tablet Take 1 tablet (81 mg total) by mouth daily.    . bisoprolol (ZEBETA) 5 MG tablet Take 1/2 tablet (2.5 mg) by mouth once daily    . diphenhydrAMINE (BENADRYL) 25 mg capsule Take 25 mg by mouth every 6 (six) hours as needed for allergies.    Marland Kitchen docusate sodium (COLACE) 100 MG capsule Take 100 mg by mouth 2 (two) times daily as needed (constipation.).    Marland Kitchen ENTRESTO 24-26 MG TAKE 1 TABLET BY MOUTH TWICE A DAY (Patient taking differently: Take 1 tablet by mouth 2 (two) times daily. ) 60 tablet 6  . furosemide (LASIX) 20 MG tablet Take 20-40 mg by mouth See admin instructions. Take 2 tablets (40 mg) by mouth in the morning & take 1 tablet (20 mg) by mouth in the afternoon.      . gabapentin (NEURONTIN) 100 MG capsule Take 200 mg by mouth at bedtime.   1  . ipratropium-albuterol (DUONEB) 0.5-2.5 (3) MG/3ML SOLN Take 3 mLs by nebulization every 6 (six) hours as needed. DX:J44.9 (Patient taking differently: Take 3 mLs by nebulization every 6 (six) hours as needed (with cold symptoms.). DX:J44.9) 1080 mL 1  . levothyroxine (SYNTHROID, LEVOTHROID) 88 MCG tablet Take 88 mcg by mouth daily before breakfast.    . Multiple Vitamins-Minerals (PRESERVISION AREDS 2 PO) Take 1 tablet by mouth 2 (two) times daily.     . pantoprazole (PROTONIX) 40 MG tablet Take 40 mg by mouth 2 (two) times daily.     . simvastatin (ZOCOR) 10 MG tablet TAKE 1 TABLET BY MOUTH EVERYDAY AT BEDTIME 90 tablet 0  . spironolactone (ALDACTONE) 25 MG tablet Take 0.5 tablets (12.5 mg total) by mouth daily. 45 tablet 3  . warfarin (COUMADIN) 2.5 MG tablet TAKE AS DIRECTED BY COUMADIN CLINIC (Patient taking differently: Take 2.5-3.75 mg by mouth See admin instructions. Take 1.5 tablets (3.75 mg) by mouth in the evenings on Mondays, Wednesdays, & Fridays. Take 1 tablet (2.5 mg) by mouth in the evening on Sundays, Tuesdays, Thursdays, & Saturdays.) 165 tablet 0   No current facility-administered medications for this visit.    Allergies  Allergen Reactions  . Ace Inhibitors Cough and Other (See Comments)  . Amoxicillin-Pot Clavulanate Swelling and Other (See Comments)    SWOLLEN JOINTS  Has patient had a PCN reaction causing immediate rash, facial/tongue/throat  swelling, SOB or lightheadedness with hypotension: Yes Has patient had a PCN reaction causing severe rash involving mucus membranes or skin necrosis: No Has patient had a PCN reaction that required hospitalization No Has patient had a PCN reaction occurring within the last 10 years: No If all of the above answers are "NO", then may proceed with Cephalosporin  . Penicillins Swelling and Other (See Comments)    SWOLLEN JOINTS  Has patient had a PCN  reaction causing immediate rash, facial/tongue/throat swelling, SOB or lightheadedness with hypotension: Yes Has patient had a PCN reaction causing severe rash involving mucus membranes or skin necrosis: No Has patient had a PCN reaction that required hospitalization No Has patient had a PCN reaction occurring within the last 10 years: No If all of the above answers are "NO", then may proceed with Cephalosporin use.  Swollen joints  . Nifedipine Other (See Comments)    UNSPECIFIED UNSPECIFIED   . Propranolol Other (See Comments)    UNSPECIFIED UNSPECIFIED   . Diazepam Other (See Comments)    Made her "hyper"   . Meperidine Other (See Comments)    Made her "hyper"    . Propoxyphene Other (See Comments)    Made her "hyper"        Review of Systems negative except from HPI and PMH  Physical Exam BP 122/60 (BP Location: Left Arm, Patient Position: Sitting, Cuff Size: Normal)   Pulse 64   Resp 16   Ht 5\' 2"  (1.575 m)   Wt 126 lb 8 oz (57.4 kg)   SpO2 95%   BMI 23.14 kg/m   Well developed and well nourished wearing O2  HENT normal Neck supple with JVP-flat Clear Device pocket well healed; without hematoma or erythema.  There is no tethering  Regular rate and rhythm, no murmur Abd-soft with active BS No Clubbing cyanosis   edema Skin-warm and dry A & Oriented  Grossly normal sensory and motor function  ECG atrial pacing at 64 Intervals 40/14/45   Assessment and  Plan  Atrial fibrillation-persistent status post maze operation  Amiodarone   Complete heart block -Intermittent  Sinus node dysfunction   First-degree AV block profound  Status post mitral valve replacement-bioprosthetic with tricuspid valve repair  Pacemaker-Medtronic  Dypsnea on Exertion   Cardiomyopathy   Anemia   Follow-up pending with pulmonary.  One of the concerns about her dyspnea is whether relates to amiodarone lung toxicity.  I will defer until she sees pulmonary the ordering  of a high-resolution CT scan; 1, I do not know how it might distinguish between Covid pneumonitis and amiodarone lung toxicity; numeral #2 by then they should have been vaccinated and their excursion to the hospital may be at somewhat reduced risk.  We will leave her pacemaker programmed as it is.  The alternative would be to fourth ventricular pacing at a short AV delay; the consequences with her moderate LV dysfunction could be that she would be significantly worse.  Currently she is only pacing in the ventricle about 10% of the time.  As she feels better than she has we will defer this change   We spent more than 50% of our >25 min visit in face to face counseling regarding the above

## 2019-09-15 LAB — HEPATIC FUNCTION PANEL
ALT: 13 IU/L (ref 0–32)
AST: 16 IU/L (ref 0–40)
Albumin: 4.2 g/dL (ref 3.6–4.6)
Alkaline Phosphatase: 86 IU/L (ref 39–117)
Bilirubin Total: <0.2 mg/dL (ref 0.0–1.2)
Bilirubin, Direct: 0.08 mg/dL (ref 0.00–0.40)
Total Protein: 6.3 g/dL (ref 6.0–8.5)

## 2019-10-12 ENCOUNTER — Other Ambulatory Visit: Payer: Self-pay | Admitting: Internal Medicine

## 2019-10-12 NOTE — Telephone Encounter (Signed)
This is a [place] pt 

## 2019-10-13 ENCOUNTER — Other Ambulatory Visit: Payer: Self-pay

## 2019-10-13 ENCOUNTER — Ambulatory Visit (INDEPENDENT_AMBULATORY_CARE_PROVIDER_SITE_OTHER): Payer: Medicare Other

## 2019-10-13 DIAGNOSIS — Z5181 Encounter for therapeutic drug level monitoring: Secondary | ICD-10-CM

## 2019-10-13 DIAGNOSIS — I340 Nonrheumatic mitral (valve) insufficiency: Secondary | ICD-10-CM | POA: Diagnosis not present

## 2019-10-13 DIAGNOSIS — Z953 Presence of xenogenic heart valve: Secondary | ICD-10-CM

## 2019-10-13 LAB — POCT INR: INR: 1.6 — AB (ref 2.0–3.0)

## 2019-10-13 NOTE — Patient Instructions (Signed)
-   Take 2 tablets tonight and tomorrow, then resume warfarin continue dosage of 1 tablet every day EXCEPT 1.5 TABLETS ON MONDAYS, WEDNESDAYS & FRIDAYS. -  Recheck INR in 4 weeks.

## 2019-10-18 ENCOUNTER — Ambulatory Visit (INDEPENDENT_AMBULATORY_CARE_PROVIDER_SITE_OTHER): Payer: Medicare Other | Admitting: Internal Medicine

## 2019-10-18 ENCOUNTER — Encounter: Payer: Self-pay | Admitting: Internal Medicine

## 2019-10-18 ENCOUNTER — Other Ambulatory Visit: Payer: Self-pay

## 2019-10-18 VITALS — BP 136/74 | HR 78 | Temp 97.8°F | Ht 62.0 in | Wt 128.4 lb

## 2019-10-18 DIAGNOSIS — I5022 Chronic systolic (congestive) heart failure: Secondary | ICD-10-CM

## 2019-10-18 DIAGNOSIS — U071 COVID-19: Secondary | ICD-10-CM | POA: Diagnosis not present

## 2019-10-18 DIAGNOSIS — I2729 Other secondary pulmonary hypertension: Secondary | ICD-10-CM | POA: Diagnosis not present

## 2019-10-18 DIAGNOSIS — J449 Chronic obstructive pulmonary disease, unspecified: Secondary | ICD-10-CM

## 2019-10-18 NOTE — Progress Notes (Addendum)
PULMONARY OFFICE FOLLOW UP NOTE  Date of initial consultation: 07/27/15 Reason for consultation: COPD  PT PROFILE:  85 y.o. F former smoker (up to 1.5 PPD, quit 1999) initially seen in 2016 for COPD. Previously followed by Dr Raul Del. She was first diagnosed with COPD in 2012 at the time of a pneumonia.   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 PFTs 03/17/15: Spirometry reveals mild/moderate obstruction (FEV1 67% predicted, FEV1/FVC 62%).  Lung volumes normal.  Diffusion capacity markedly reduced at 35% predicted.  DLCO/VA 54% predicted Echocardiogram 08/22/15: LVEF 30-35%. Diffuse HK. prosthetic valves functioning well ONO on RA 02/22/16: significant desaturation to low of 78%. Total desaturation events: 170 Echocardiogram 09/17/16: LVEF 50-55%.  LA mildly dilated.  RVSP est normal Echocardiogram 07/17/18: LVEF 20-25%.  Diffuse hypokinesis.  Prosthetic mitral valve functioning normally.  LA mildly dilated.  RV moderately dilated.  RV systolic function moderately reduced.  PA systolic pressure could not be accurately estimated Echocardiogram 08/24/18: LVEF 35-40%.  Mild concentric hypertrophy.  Diffuse hypokinesis.  Bioprosthetic mitral valve functioning normally.  LA mildly dilated.  RV cavity mildly dilated.  RVSP could not be accurately estimated. Echocardiogram 10/26/18: LVEF 35-40%.  LA moderately dilated.  Bioprosthetic mitral valve present.  RV systolic function mildly reduced.  RV cavity moderately enlarged.  RA moderately dilated.  RVSP est 26 mmHg R heart cath 02/08/19: Mean PCWP 23 mmHg. Large V wave c/w MR.  Echocardiogram 04/01/19: LVEF 35-40%.  Moderate LVH.  LA mildly dilated.  Bioprosthetic mitral valve present.  RV mildly dilated with moderately reduced systolic function.  RVSP 35 mmHg. PAP 69/24 mmHg  PROBLEMS: Mild COPD CHF S/P Maze procedure, CABG with  chronic sternal wound infection   CC Follow up COPD  HPI Previous history of COVID-19 infection in June 2020 Patient had been hospitalized from COVID-19 infection and cardiac issues last year and subsequently was discharged on 24 hours/day oxygen therapy Patient with chronic shortness of breath and chronic dyspnea exertion  No signs of exacerbation at this time No signs of acute heart failure at this time A. fib seems to be well-controlled at this time with amiodarone Patient takes amiodarone 100 mg daily  Patient uses and benefits from oxygen therapy She does really well with this therapy and needs this for survival   Review of Systems:  Gen:  Denies  fever, sweats, chills weight loss  HEENT: Denies blurred vision, double vision, ear pain, eye pain, hearing loss, nose bleeds, sore throat Cardiac:  No dizziness, chest pain or heaviness, chest tightness,edema, No JVD Resp:  +shortness of breath,-wheezing, -hemoptysis, chronic dyspnea exertion Gi: Denies swallowing difficulty, stomach pain, nausea or vomiting, diarrhea, constipation, bowel incontinence Gu:  Denies bladder incontinence, burning urine Ext:   Denies Joint pain, stiffness or swelling Skin: Denies  skin rash, easy bruising or bleeding or hives Endoc:  Denies polyuria, polydipsia , polyphagia or weight change Psych:   Denies depression, insomnia or hallucinations  Other:  All other systems negative  BP 136/74 (BP Location: Left Arm, Patient Position: Sitting, Cuff Size: Large)   Pulse 78   Temp 97.8 F (36.6 C) (Temporal)   Ht 5\' 2"  (1.575 m)   Wt 128 lb 6.4 oz (58.2 kg)   SpO2 98% Comment: on 2L [place]  BMI 23.48 kg/m     Physical Examination:   GENERAL:NAD, no fevers, chills, no weakness no fatigue HEAD: Normocephalic, atraumatic.  EYES: PERLA,  EOMI No scleral icterus.  NECK: Supple. No thyromegaly.  No JVD.  PULMONARY: CTA B/L no wheezing, rhonchi, crackles CARDIOVASCULAR: S1 and S2. Regular rate and  rhythm. No murmurs GASTROINTESTINAL: Soft, nontender, nondistended. Positive bowel sounds.  MUSCULOSKELETAL: No swelling, clubbing, or edema.  NEUROLOGIC: No gross focal neurological deficits. 5/5 strength all extremities SKIN: No ulceration, lesions, rashes, or cyanosis.  PSYCHIATRIC: Insight, judgment intact. -depression -anxiety ALL OTHER ROS ARE NEGATIVE     BMP Latest Ref Rng & Units 06/03/2019 03/16/2019 02/26/2019  Glucose 65 - 99 mg/dL 100(H) 80 123(H)  BUN 8 - 27 mg/dL 20 25 36(H)  Creatinine 0.57 - 1.00 mg/dL 0.74 0.97 0.84  BUN/Creat Ratio 12 - 28 27 26  -  Sodium 134 - 144 mmol/L 145(H) 146(H) 141  Potassium 3.5 - 5.2 mmol/L 4.2 4.8 4.9  Chloride 96 - 106 mmol/L 99 101 92(L)  CO2 20 - 29 mmol/L 27 29 37(H)  Calcium 8.7 - 10.3 mg/dL 9.0 8.9 8.5(L)    CBC Latest Ref Rng & Units 06/03/2019 03/16/2019 02/26/2019  WBC 3.4 - 10.8 x10E3/uL 6.0 6.6 7.0  Hemoglobin 11.1 - 15.9 g/dL 11.4 7.2(L) 8.0(L)  Hematocrit 34.0 - 46.6 % 35.2 21.0(L) 24.1(L)  Platelets 150 - 450 x10E3/uL 156 207 186   CXR 02/22/19: Status post sternotomy.  AICD in place.  Cardiomegaly.  Patchy bilateral infiltrates.   85 year old white female with a history of COPD with history of coronary artery disease AICD with previous infection of COVID-19 pneumonia with bilateral interstitial infiltrates on chest x-ray with chronic hypoxic respiratory failure  COPD Chronic shortness of breath chronic dyspnea exertion Continue nebulized bronchodilators as needed No indication for prednisone or antibiotics at this time No exacerbation at this time Pulmonary function testing would not be feasible in the setting of chronic respiratory insufficiency  Chronic hypoxic respiratory failure continue oxygen as prescribed Patient uses and benefits from oxygen therapy Patient needs this for survival  Amiodarone therapy for A. Fib I recommend continuing amiodarone therapy as prescribed At this point I do NOT see any indication  for obtaining a CT chest since she is already have oxygen therapy for underlying pulmonary and heart disease Follow-up with cardiology as scheduled  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 Continue anticoagulation as prescribed Continue oxygen therapy as prescribed  Chronic respiratory insufficiency Patient is very debilitated with her balance as well as her respiratory status Continue oxygen as needed Nebulized therapy as needed   Chronic heart failure with pulmonary pretension Follow-up with cardiology as prescribed and scheduled Continue amiodarone Continue anticoagulation therapy       COVID-19 EDUCATION: The signs and symptoms of COVID-19 were discussed with the patient and how to seek care for testing.  The importance of social distancing was discussed today. Hand Washing Techniques and avoid touching face was advised.     MEDICATION ADJUSTMENTS/LABS AND TESTS ORDERED: Continue nebulizers as prescribed Continue oxygen as prescribed Follow-up with cardiology as scheduled Continue amiodarone as prescribed No indication for CT at this time   [place]   Patient satisfied with Plan of action and management. All questions answered  Follow up in 6 months   Lasharon Dunivan Patricia Pesa, M.D.  [facility]  Medical Director [place] Director [facility] Cardio-Pulmonary Department

## 2019-10-18 NOTE — Patient Instructions (Signed)
Continue oxygen as prescribed Continue nebulizers as needed Continue amiodarone as prescribed by cardiology Follow-up cardiology

## 2019-11-10 ENCOUNTER — Other Ambulatory Visit: Payer: Self-pay

## 2019-11-10 ENCOUNTER — Ambulatory Visit (INDEPENDENT_AMBULATORY_CARE_PROVIDER_SITE_OTHER): Payer: Medicare Other

## 2019-11-10 DIAGNOSIS — Z953 Presence of xenogenic heart valve: Secondary | ICD-10-CM

## 2019-11-10 DIAGNOSIS — Z5181 Encounter for therapeutic drug level monitoring: Secondary | ICD-10-CM

## 2019-11-10 DIAGNOSIS — I340 Nonrheumatic mitral (valve) insufficiency: Secondary | ICD-10-CM | POA: Diagnosis not present

## 2019-11-10 LAB — POCT INR: INR: 1.8 — AB (ref 2.0–3.0)

## 2019-11-10 NOTE — Patient Instructions (Signed)
-   START NEW WARFARIN DOSAGE of 1.5 tablets every day EXCEPT 1 TABLETS ON MONDAYS, [place]. -  Recheck INR in 4 weeks.

## 2019-11-19 ENCOUNTER — Ambulatory Visit: Payer: Medicare Other | Admitting: Cardiovascular Disease

## 2019-11-23 ENCOUNTER — Other Ambulatory Visit: Payer: Self-pay | Admitting: Internal Medicine

## 2019-11-24 ENCOUNTER — Other Ambulatory Visit: Payer: Self-pay

## 2019-11-24 ENCOUNTER — Ambulatory Visit (INDEPENDENT_AMBULATORY_CARE_PROVIDER_SITE_OTHER): Payer: Medicare Other | Admitting: Nurse Practitioner

## 2019-11-24 ENCOUNTER — Encounter: Payer: Self-pay | Admitting: Nurse Practitioner

## 2019-11-24 VITALS — BP 142/60 | HR 69 | Ht 61.0 in | Wt 130.0 lb

## 2019-11-24 DIAGNOSIS — I5022 Chronic systolic (congestive) heart failure: Secondary | ICD-10-CM | POA: Diagnosis not present

## 2019-11-24 DIAGNOSIS — I4819 Other persistent atrial fibrillation: Secondary | ICD-10-CM

## 2019-11-24 DIAGNOSIS — I10 Essential (primary) hypertension: Secondary | ICD-10-CM

## 2019-11-24 DIAGNOSIS — I2510 Atherosclerotic heart disease of native coronary artery without angina pectoris: Secondary | ICD-10-CM

## 2019-11-24 DIAGNOSIS — I428 Other cardiomyopathies: Secondary | ICD-10-CM

## 2019-11-24 DIAGNOSIS — J9611 Chronic respiratory failure with hypoxia: Secondary | ICD-10-CM

## 2019-11-24 DIAGNOSIS — I38 Endocarditis, valve unspecified: Secondary | ICD-10-CM

## 2019-11-24 MED ORDER — ENTRESTO 49-51 MG PO TABS: ORAL_TABLET | ORAL | 6 refills | Status: DC

## 2019-11-24 NOTE — Progress Notes (Signed)
Office Visit    Patient Name: [name] Date of Encounter: 11/24/2019  Primary Care Provider:  Ezequiel Kayser, MD Primary Cardiologist:  Kathlyn Sacramento, MD  EP: Olin Pia, MD   Chief Complaint    85 year old female with a history of paroxysmal atrial fibrillation, mitral valve stenosis and tricuspid regurgitation status post mitral valve replacement/TAVR/maze/left atrial appendage clipping, CAD status post CABG x1, COPD on home O2, complete heart block status post permanent pacemaker, hypertension, hyperlipidemia, and hypothyroidism, who presents for follow-up of heart failure.  Past Medical History    Past Medical History:  Diagnosis Date  . Anxiety   . Arthritis    "hands" (11/10/2015)  . Breast cancer ([place]) 2002   right  . CAD s/p CABG x 1    a. 04/2015 LIMA to LAD  . Chronic combined systolic (congestive) and diastolic (congestive) heart failure ([place])    a. 01/2015 Echo: EF 50-55%, Gr2 DD (preop valve surgery); b. 08/2015 Echo: EF 30-35%, diff HK. Gr2 DD; c. 09/2016 Echo: EF 50-55%; d. 07/2018 Echo: EF 20-25%; e. 03/2019 Echo: EF 35-40%, diff HK. Mod red RV fxn, RVSP 34.76mmHg. Mildly dil LA. Nl MV prosthesis. Triv AI.  Marland Kitchen COPD (chronic obstructive pulmonary disease) ([place])   . Coronary artery disease   . GERD (gastroesophageal reflux disease)   . History of colon polyps   . History of mitral valve replacement    a. 04/2015 s/p 27 mm [place] Mitral bovine bioprosthetic tissue valve  . Hyperlipidemia   . Hypertension   . Hypothyroidism   . 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 04/2015 Complete bilateral atrial lesion set using cryothermy and bipolar radiofrequency ablation with clipping of LA appendage  . Meniere disease   . Meniere's disease   . On home oxygen therapy    "2L at night" (11/10/2015)  . PAF (paroxysmal atrial fibrillation) ([place])   . Personal history of radiation therapy   . Pneumonia ~ 2010  . PONV (postoperative nausea and vomiting)    Pt felt like  eardrums were gonna pop  . Post-surgical complete heart block, symptomatic    a. 04/2015 s/p MDT [id number] [facility] DR MRI DC PPM.  . Presence of permanent cardiac pacemaker   . Pulmonary hypertension ([place])   . Surgical wound, non healing - chest wall 11/10/2015  . Tricuspid Regurgitation s/p Repair    a. 04/2015 s/p 26 mm Edwards mc3 ring annuloplasty  . Wound infection 10/13/2015   Superficial sternal wound infection   Past Surgical History:  Procedure Laterality Date  . APPENDECTOMY    . APPLICATION OF A-CELL OF CHEST/ABDOMEN N/A 11/21/2015   Procedure: APPLICATION OF A-CELL OF CHEST/ABDOMEN;  Surgeon: Loel Lofty Dillingham, DO;  Location: [place];  Service: Plastics;  Laterality: N/A;  . APPLICATION OF A-CELL OF CHEST/ABDOMEN Right 12/21/2015   Procedure: APPLICATION OF A-CELL OF RIGHT CHEST;  Surgeon: Loel Lofty Dillingham, DO;  Location: [place];  Service: Plastics;  Laterality: Right;  . APPLICATION OF A-CELL OF CHEST/ABDOMEN Right 01/11/2016   Procedure: APPLICATION OF A-CELL OF RIGHT CHEST;  Surgeon: Loel Lofty Dillingham, DO;  Location: [place];  Service: Plastics;  Laterality: Right;  . APPLICATION OF A-CELL OF CHEST/ABDOMEN Right 02/12/2016   Procedure: APPLICATION OF A-CELL TO RIGHT CHEST WOUND;  Surgeon: Loel Lofty Dillingham, DO;  Location: [place];  Service: Plastics;  Laterality: Right;  . APPLICATION OF WOUND VAC N/A 05/09/2015   Procedure: APPLICATION OF WOUND VAC;  Surgeon: Rexene Alberts, MD;  Location: [place];  Service: Thoracic;  Laterality: N/A;  . APPLICATION OF WOUND VAC N/A 11/13/2015   Procedure: APPLICATION OF WOUND VAC;  Surgeon: Rexene Alberts, MD;  Location: [place];  Service: Thoracic;  Laterality: N/A;  . APPLICATION OF WOUND VAC N/A 11/21/2015   Procedure: APPLICATION OF WOUND VAC;  Surgeon: Loel Lofty Dillingham, DO;  Location: [place];  Service: Plastics;  Laterality: N/A;  . APPLICATION OF WOUND VAC Right 12/21/2015   Procedure: APPLICATION OF WOUND VAC to right chest wall ;  Surgeon: Loel Lofty Dillingham, DO;  Location: [place];  Service: Plastics;  Laterality: Right;  . APPLICATION OF WOUND VAC Right 01/11/2016   Procedure: APPLICATION OF WOUND VAC RIGHT CHEST ;  Surgeon: Loel Lofty Dillingham, DO;  Location: [place];  Service: Plastics;  Laterality: Right;  . APPLICATION OF WOUND VAC Right 01/24/2016   Procedure: APPLICATION OF WOUND VAC RIGHT CHEST WALL;  Surgeon: Loel Lofty Dillingham, DO;  Location: [place];  Service: Plastics;  Laterality: Right;  . APPLICATION OF WOUND VAC Right 02/12/2016   Procedure: RE-APPLICATION OF WOUND VAC TO RIGHT CHEST WOUND;  Surgeon: Loel Lofty Dillingham, DO;  Location: [place];  Service: Plastics;  Laterality: Right;  . BIV UPGRADE N/A 06/07/2019   Procedure: BIV UPGRADE;  Surgeon: Deboraha Sprang, MD;  Location: [place] [facility];  Service: Cardiovascular;  Laterality: N/A;  . BREAST BIOPSY Left 11/25/06   neg  . BREAST BIOPSY Left 01/20/12   /clip-neg  . CARDIAC CATHETERIZATION  11/2013   [facility]  . CARDIAC CATHETERIZATION  10/2014   [facility]  . CARDIAC VALVE REPLACEMENT    . CARDIOVERSION N/A 07/20/2018   Procedure: CARDIOVERSION;  Surgeon: Wellington Hampshire, MD;  Location: [facility];  Service: Cardiovascular;  Laterality: N/A;  . CATARACT EXTRACTION W/ INTRAOCULAR LENS  IMPLANT, BILATERAL Bilateral 2014  . CLIPPING OF ATRIAL APPENDAGE N/A 04/25/2015   Procedure: CLIPPING OF ATRIAL APPENDAGE;  Surgeon: Rexene Alberts, MD;  Location: [place];  Service: Open Heart Surgery;  Laterality: N/A;  . COCHLEAR IMPLANT Left 2005?  . COLONOSCOPY WITH PROPOFOL N/A 01/20/2018   Procedure: COLONOSCOPY WITH PROPOFOL;  Surgeon: Tol[place], Benay [place], MD;  Location: [facility];  Service: Gastroenterology;  Laterality: N/A;  . CORONARY ANGIOPLASTY    . CORONARY ARTERY BYPASS GRAFT N/A 04/25/2015   Procedure: CORONARY ARTERY BYPASS GRAFTING (CABG) x ONE, using left internal mammary artery;  Surgeon: Rexene Alberts, MD;  Location: [place];  Service: Open Heart Surgery;  Laterality: N/A;  .  DILATION AND CURETTAGE OF UTERUS  "several before hysterectomy"  . EP IMPLANTABLE DEVICE N/A 05/02/2015   Procedure: Pacemaker Implant;  Surgeon: Thompson Grayer, MD;  Location: [place] [facility];  Service: Cardiovascular;  Laterality: N/A;  . ESOPHAGOGASTRODUODENOSCOPY (EGD) WITH PROPOFOL N/A 01/20/2018   Procedure: ESOPHAGOGASTRODUODENOSCOPY (EGD) WITH PROPOFOL;  Surgeon: Toledo, Benay [place], MD;  Location: [facility];  Service: Gastroenterology;  Laterality: N/A;  . EYE SURGERY    . I & D EXTREMITY Right 12/06/2015   Procedure: IRRIGATION AND DEBRIDEMENT RIGHT CHEST WALL WITH ACELL PLACEMENT AND VAC;  Surgeon: Loel Lofty Dillingham, DO;  Location: [place];  Service: Plastics;  Laterality: Right;  . INCISION AND DRAINAGE OF WOUND N/A 11/21/2015   Procedure: IRRIGATION AND DEBRIDEMENT WOUND;  Surgeon: Loel Lofty Dillingham, DO;  Location: [place];  Service: Plastics;  Laterality: N/A;  . INCISION AND DRAINAGE OF WOUND Right 12/21/2015  Procedure: IRRIGATION AND DEBRIDEMENT right chest wall WOUND;  Surgeon: Loel Lofty Dillingham, DO;  Location: [place];  Service: Plastics;  Laterality: Right;  right chest wall   . INCISION AND DRAINAGE OF WOUND Right 01/24/2016   Procedure: IRRIGATION AND DEBRIDEMENT RIGHT CHEST WALL WOUND;  Surgeon: Loel Lofty Dillingham, DO;  Location: [place];  Service: Plastics;  Laterality: Right;  . INCISION AND DRAINAGE OF WOUND Right 03/28/2016   Procedure: IRRIGATION AND DEBRIDEMENT RIGHT CHEST WALL WOUND WITH A Cell Placement;  Surgeon: Loel Lofty Dillingham, DO;  Location: [place];  Service: Plastics;  Laterality: Right;  . INCISION AND DRAINAGE OF WOUND Right 05/17/2016   Procedure: IRRIGATION AND DEBRIDEMENT OF RIGHT CHEST WOUND WITH A CELL PLACEMENT;  Surgeon: Loel Lofty Dillingham, DO;  Location: [place];  Service: Plastics;  Laterality: Right;  . INSERT / REPLACE / REMOVE PACEMAKER    . IRRIGATION AND DEBRIDEMENT OF WOUND WITH SPLIT THICKNESS SKIN GRAFT Right 01/11/2016   Procedure: IRRIGATION AND  DEBRIDEMENT OF RIGHT CHEST WOUND ;  Surgeon: Loel Lofty Dillingham, DO;  Location: [place];  Service: Plastics;  Laterality: Right;  . MASTECTOMY, PARTIAL Right 2002   positive  . MAZE N/A 04/25/2015   Procedure: MAZE;  Surgeon: Rexene Alberts, MD;  Location: [place];  Service: Open Heart Surgery;  Laterality: N/A;  . MITRAL VALVE REPAIR N/A 04/25/2015   Procedure: MITRAL VALVE  REPLACEMENT using a 27 mm Edwards Perimount Magna Mitral Ease Valve;  Surgeon: Rexene Alberts, MD;  Location: [place];  Service: Open Heart Surgery;  Laterality: N/A;  . RIGHT HEART CATH N/A 02/08/2019   Procedure: RIGHT HEART CATH;  Surgeon: Wellington Hampshire, MD;  Location: [place] [facility];  Service: Cardiovascular;  Laterality: N/A;  . SKIN SPLIT GRAFT Right 02/12/2016   Procedure: IRRIGATION AND DEBRIDEMENT RIGHT CHEST WOUND;  Surgeon: Loel Lofty Dillingham, DO;  Location: [place];  Service: Plastics;  Laterality: Right;  . STERNAL WIRES REMOVAL N/A 06/05/2015   Procedure: STERNAL WIRES REMOVAL;  Surgeon: Rexene Alberts, MD;  Location: [place];  Service: Thoracic;  Laterality: N/A;  . STERNAL WOUND DEBRIDEMENT N/A 05/09/2015   Procedure: STERNAL WOUND DEBRIDEMENT;  Surgeon: Rexene Alberts, MD;  Location: [place];  Service: Thoracic;  Laterality: N/A;  . STERNAL WOUND DEBRIDEMENT N/A 11/13/2015   Procedure: Excisional drainage of RIGHT Chest wall mass and breast mass ;  Surgeon: Rexene Alberts, MD;  Location: [place];  Service: Thoracic;  Laterality: N/A;  . TEE WITHOUT CARDIOVERSION N/A 04/25/2015   Procedure: TRANSESOPHAGEAL ECHOCARDIOGRAM (TEE);  Surgeon: Rexene Alberts, MD;  Location: [place];  Service: Open Heart Surgery;  Laterality: N/A;  . TONSILLECTOMY    . TRAM Right 11/18/2015   Procedure: VRAM Vertical Rectus Abdominus Muscle Flap;  Surgeon: Loel Lofty Dillingham, DO;  Location: [place];  Service: Plastics;  Laterality: Right;  RIght Back  . TRICUSPID VALVE REPLACEMENT N/A 04/25/2015   Procedure: TRICUSPID VALVE REPAIR;  Surgeon:  Rexene Alberts, MD;  Location: [place];  Service: Open Heart Surgery;  Laterality: N/A;  . VAGINAL HYSTERECTOMY      Allergies  Allergies  Allergen Reactions  . Ace Inhibitors Cough and Other (See Comments)  . Amoxicillin-Pot Clavulanate Swelling and Other (See Comments)    SWOLLEN JOINTS  Has patient had a PCN reaction causing immediate rash, facial/tongue/throat swelling, SOB or lightheadedness with hypotension: Yes Has patient had a PCN reaction causing severe rash involving mucus membranes or  skin necrosis: No Has patient had a PCN reaction that required hospitalization No Has patient had a PCN reaction occurring within the last 10 years: No If all of the above answers are "NO", then may proceed with Cephalosporin  . Penicillins Swelling and Other (See Comments)    SWOLLEN JOINTS  Has patient had a PCN reaction causing immediate rash, facial/tongue/throat swelling, SOB or lightheadedness with hypotension: Yes Has patient had a PCN reaction causing severe rash involving mucus membranes or skin necrosis: No Has patient had a PCN reaction that required hospitalization No Has patient had a PCN reaction occurring within the last 10 years: No If all of the above answers are "NO", then may proceed with Cephalosporin use.  Swollen joints  . Nifedipine Other (See Comments)    UNSPECIFIED UNSPECIFIED   . Propranolol Other (See Comments)    UNSPECIFIED UNSPECIFIED   . Diazepam Other (See Comments)    Made her "hyper"   . Meperidine Other (See Comments)    Made her "hyper"    . Propoxyphene Other (See Comments)    Made her "hyper"      History of Present Illness    85 year old female with the above complex past medical history.  In February 2016, she was diagnosed with paroxysmal atrial fibrillation and CHF.  EF was initially 35-40%.  Catheterization revealed moderate proximal LAD disease and transesophageal echocardiogram revealed moderate tricuspid regurgitation, pulmonary  arterial hypertension and moderate-severe mitral vegetation.  She underwent cardioversion at that time.  Follow-up echocardiogram showed persistent severe mitral digitation despite improvement in LV function she was subsequently referred to thoracic surgery and underwent mitral valve replacement with a bovine valve, tricuspid valve repair, maze, left atrial appendage clipping, and CABG x1 with a LIMA to the LAD.  Postoperative course was complicated by sternal wound infection and complete heart block requiring placement of a [employer]tronic permanent pacemaker.  She has been maintained on low-dose amiodarone along with warfarin since.  In December 2016, she had worsening heart failure symptoms with a drop in EF to 30-35%.  This is felt to be secondary to RV apical pacing.  Following pacemaker adjustments, she had improvement in symptoms and follow-up echo in January 2018 showed improvement of EF to 50-55%.  In late 2019, she noted recurrent dyspnea and fatigue and was found to be in rapid atrial fibrillation in clinic.  Follow-up echocardiogram showed worsening of LV function-20-25%.  She required hospitalization and cardioversion.  Follow-up echo in July 2020 showed slight improvement in EF to 35-40% with diffuse hypokinesis.  She continued of heart failure symptoms and right heart catheterization was undertaken in June 2020 showing moderately elevated filling pressures with severe pulmonary hypertension and mildly reduced cardiac output.  There was no evidence of left to right intracardiac shunting by saturation run.  Prominent V waves are noted on pulmonary capillary wedge pressure suggestive of significant mitral regurgitation.  Furosemide dosing was increased and symptoms improved.  She was subsequently hospitalized for COVID-19 infection in June 2020 but fortunately did not have any significant sequelae.  In September 2020, she underwent attempted upgrade of her pacemaker to a biventricular device but this procedure  was not successful due to inability to place an LV lead.  She was last seen in clinic in January 2020 at which time she reported ongoing dyspnea on exertion occurring with minimal activity.  She was referred to pulmonology and was seen on February 8.  It was not felt that pulmonary function testing would be feasible in  the setting of chronic respiratory insufficiency.  Further, there was not felt to be an indication for high-res chest CT in the setting of known underlying pulmonary and heart disease and need for ongoing supplemental oxygen therapy.  Since her pulmonology visit, she is continued have dyspnea on exertion.  She notes significant unsteadiness on her feet which also limits ambulation and leads to dyspnea.  She says that if she can lean on a shopping cart, she can actually do fairly well in getting around a store.  She denies chest pain, palpitations, PND, orthopnea, dizziness, syncope, edema, or early satiety.  Home Medications    Prior to Admission medications   Medication Sig Start Date End Date Taking? Authorizing Provider  acetaminophen (TYLENOL) 500 MG tablet Take 1,000 mg by mouth every 6 (six) hours as needed (for pain.).    [provider]  AMBULATORY NON FORMULARY MEDICATION [facility] at Home Oxygen machine with tubing. DX: COPD DX Code: J44.9 01/15/17   Wilhelmina Mcardle, MD  amiodarone (PACERONE) 200 MG tablet TAKE 1/2 TABLET BY MOUTH EVERY DAY 11/23/19   Deboraha Sprang, MD  aspirin EC 81 MG EC tablet Take 1 tablet (81 mg total) by mouth daily. 05/11/15   Barrett, Erin R, PA-C  bisoprolol (ZEBETA) 5 MG tablet TAKE 1 TABLET BY MOUTH EVERY DAY 10/13/19   Deboraha Sprang, MD  diphenhydrAMINE (BENADRYL) 25 mg capsule Take 25 mg by mouth every 6 (six) hours as needed for allergies.    [provider]  docusate sodium (COLACE) 100 MG capsule Take 100 mg by mouth 2 (two) times daily as needed (constipation.).    [provider]  ENTRESTO 24-26 MG TAKE 1 TABLET BY MOUTH  TWICE A DAY Patient taking differently: Take 1 tablet by mouth 2 (two) times daily.  05/24/19   Deboraha Sprang, MD  furosemide (LASIX) 20 MG tablet Take 20-40 mg by mouth See admin instructions. Take 2 tablets (40 mg) by mouth in the morning & take 1 tablet (20 mg) by mouth in the afternoon. 01/30/19   [provider]  gabapentin (NEURONTIN) 100 MG capsule Take 200 mg by mouth at bedtime.  09/29/15   [provider]  ipratropium-albuterol (DUONEB) 0.5-2.5 (3) MG/3ML SOLN Take 3 mLs by nebulization every 6 (six) hours as needed. DX:J44.9 Patient taking differently: Take 3 mLs by nebulization every 6 (six) hours as needed (with cold symptoms.). DX:J44.9 10/28/17   Flora Lipps, MD  levothyroxine (SYNTHROID, LEVOTHROID) 88 MCG tablet Take 88 mcg by mouth daily before breakfast.    [provider]  Multiple Vitamins-Minerals (PRESERVISION AREDS 2 PO) Take 1 tablet by mouth 2 (two) times daily.     [provider]  pantoprazole (PROTONIX) 40 MG tablet Take 40 mg by mouth 2 (two) times daily.     [provider]  simvastatin (ZOCOR) 10 MG tablet TAKE 1 TABLET BY MOUTH EVERYDAY AT BEDTIME 07/12/19   Dunn, Areta Haber, PA-C  spironolactone (ALDACTONE) 25 MG tablet Take 0.5 tablets (12.5 mg total) by mouth daily. 09/17/18 06/01/20  Rise Mu, PA-C  warfarin (COUMADIN) 2.5 MG tablet TAKE AS DIRECTED BY COUMADIN CLINIC Patient taking differently: Take 2.5-3.75 mg by mouth See admin instructions. Take 1.5 tablets (3.75 mg) by mouth in the evenings on Mondays, Wednesdays, & Fridays. Take 1 tablet (2.5 mg) by mouth in the evening on Sundays, Tuesdays, Thursdays, & Saturdays. 04/26/19   Deboraha Sprang, MD    Review of Systems  Chronic, stable dyspnea on exertion.  She has chronic unsteadiness on her feet which exacerbates dyspnea as well.  She does reasonably well when she is able to lean on a shopping cart and walk.  She denies chest pain, palpitations, PND, orthopnea,  dizziness, syncope, edema, or early satiety.  All other systems reviewed and are otherwise negative except as noted above.  Physical Exam    VS:  BP (!) 142/60 (BP Location: Left Arm, Patient Position: Sitting, Cuff Size: Normal)   Pulse 69   Ht 5\' 1"  (1.549 m)   Wt 130 lb (59 kg)   SpO2 98%   BMI 24.56 kg/m  , BMI Body mass index is 24.56 kg/m. GEN: Well nourished, well developed, in no acute distress. HEENT: normal. Neck: Supple, no JVD, carotid bruits, or masses. Cardiac: RRR, no murmurs, rubs, or gallops. No clubbing, cyanosis, edema.  Radials/PT 2+ and equal bilaterally.  Respiratory:  Respirations regular and unlabored, clear to auscultation bilaterally. GI: Soft, nontender, nondistended, BS + x 4. MS: no deformity or atrophy. Skin: warm and dry, no rash. Neuro:  Strength and sensation are intact. Psych: Normal affect.  Accessory Clinical Findings    ECG personally reviewed by me today -atrial pacing with ventricular sensing, 69, left axis deviation, right bundle branch block- no acute changes.  Lab Results  Component Value Date   WBC 6.0 06/03/2019   HGB 11.4 06/03/2019   HCT 35.2 06/03/2019   MCV 98 (H) 06/03/2019   PLT 156 06/03/2019   Lab Results  Component Value Date   CREATININE 0.74 06/03/2019   BUN 20 06/03/2019   NA 145 (H) 06/03/2019   K 4.2 06/03/2019   CL 99 06/03/2019   CO2 27 06/03/2019   Lab Results  Component Value Date   ALT 13 09/14/2019   AST 16 09/14/2019   ALKPHOS 86 09/14/2019   BILITOT <0.2 09/14/2019   Lab Results  Component Value Date   TRIG 48 02/22/2019    Lab Results  Component Value Date   HGBA1C 6.0 (H) 07/18/2018   Lab Results  Component Value Date   INR 1.8 (A) 11/10/2019   INR 1.6 (A) 10/13/2019   INR 2.1 09/01/2019    Labs 07/30/2019  Total cholesterol 179, triglycerides 76, HDL 59.9, LDL calculated 104  Assessment & Plan    1.  Chronic systolic congestive heart failure/mixed ischemic and nonischemic  cardiomyopathy: She has chronic dyspnea on exertion and requires supplemental oxygen around-the-clock.  Overall, she has been stable and is euvolemic on examination.  She says that ever since starting [place], she has felt better.  Blood pressure 142/60 today.  She was in the 130s at recent pulmonology visit.  I am going to increase her Entresto to 49-51 twice daily and arrange for follow-up basic metabolic panel in 1 week.  I did recommend that she or her husband check her blood pressure daily.  She otherwise remains on bisoprolol and spironolactone therapy.  Previous unsuccessful LV lead/CRT upgrade.  2.  Coronary artery disease: Status post single-vessel bypass.  No chest pain.  She remains on low-dose aspirin, beta-blocker, and statin therapy.  3.  Valvular heart disease: Status post mitral valve replacement and tricuspid valve repair.  Stable valvular function on most recent echo in July 2020.  4.  Persistent atrial fibrillation: Maintaining sinus rhythm-atrial paced today.  She remains on low-dose amiodarone therapy and warfarin therapy.  INR was subtherapeutic at 1.8 earlier this month and she is being managed by Coumadin  clinic.  5.  Hyperlipidemia: She remains on low-dose simvastatin therapy.  LDL recently not at goal at 104.  This is been followed by primary care and simvastatin dosing is limited by ongoing amiodarone therapy, may wish to consider switching to a higher potency statin.  6.  Chronic respiratory failure: History remote tobacco abuse quitting in 1999.  She has been on oxygen therapy since heart failure exacerbation in late 2019.  She now is following with pulmonology locally.  Recently not felt to require high-res CT or PFTs.  7.  Essential hypertension: Blood pressure elevated on recent visits.  Titrating Entresto as above.    8.  Disposition: Follow-up basic metabolic panel in 1 week.  Follow-up in clinic in 3 months or sooner if necessary.  Murray Hodgkins, NP 11/24/2019,  5:54 PM

## 2019-11-24 NOTE — Patient Instructions (Addendum)
Medication Instructions:  - Your physician has recommended you make the following change in your medication:   1) Increase entresto to 49/51 mg- take 1 tablet by mouth twice daily  - you may double up on your current supply of entresto 24/26 mg tablets and take 2 tablets twice daily until you use these up  *If you need a refill on your cardiac medications before your next appointment, please call your pharmacy*   Lab Work: - Your physician recommends that you return for lab work in: 1 week- BMP - please come to the [facility] at [facility], 1st desk on the right (past the screening table) to check in - Labs are done on a walk in basis Monday-Friday (7:30 am-5:30 pm) - You do not have to be fasting  If you have labs (blood work) drawn today and your tests are completely normal, you will receive your results only by: Marland Kitchen MyChart Message (if you have MyChart) OR . A paper copy in the mail If you have any lab test that is abnormal or we need to change your treatment, we will call you to review the results.   Testing/Procedures: - none ordered    Follow-Up: At [facility], you and your health needs are our priority.  As part of our continuing mission to provide you with exceptional heart care, we have created designated Provider Care Teams.  These Care Teams include your primary Cardiologist (physician) and Advanced Practice Providers (APPs -  Physician Assistants and Nurse Practitioners) who all work together to provide you with the care you need, when you need it.  We recommend signing up for the patient portal called "MyChart".  Sign up information is provided on this After Visit Summary.  MyChart is used to connect with patients for Virtual Visits (Telemedicine).  Patients are able to view lab/test results, encounter notes, upcoming appointments, etc.  Non-urgent messages can be sent to your provider as well.   To learn more about what you can do with MyChart, go to  [email or web address].    Your next appointment:   3-4 month(s)  The format for your next appointment:   In Person  Provider:    You may see Kathlyn Sacramento, MD or one of the following Advanced Practice Providers on your designated Care Team:    Murray Hodgkins, NP  Christell Faith, PA-C  Marrianne Mood, PA-C    Other Instructions n/a

## 2019-12-01 ENCOUNTER — Other Ambulatory Visit
Admission: RE | Admit: 2019-12-01 | Discharge: 2019-12-01 | Disposition: A | Discharge status: Home / Self Care | Payer: Medicare Other | Source: Ambulatory Visit | Attending: Nurse Practitioner | Admitting: Nurse Practitioner

## 2019-12-01 DIAGNOSIS — I5022 Chronic systolic (congestive) heart failure: Secondary | ICD-10-CM | POA: Insufficient documentation

## 2019-12-01 DIAGNOSIS — I10 Essential (primary) hypertension: Secondary | ICD-10-CM | POA: Diagnosis present

## 2019-12-01 LAB — BASIC METABOLIC PANEL WITH GFR
Anion gap: 9 (ref 5–15)
BUN: 21 mg/dL (ref 8–23)
CO2: 35 mmol/L — ABNORMAL HIGH (ref 22–32)
Calcium: 9.4 mg/dL (ref 8.9–10.3)
Chloride: 101 mmol/L (ref 98–111)
Creatinine, Ser: 0.91 mg/dL (ref 0.44–1.00)
GFR calc Af Amer: >60 mL/min
GFR calc non Af Amer: 59 mL/min — ABNORMAL LOW
Glucose, Bld: 152 mg/dL — ABNORMAL HIGH (ref 70–99)
Potassium: 3.6 mmol/L (ref 3.5–5.1)
Sodium: 145 mmol/L (ref 135–145)

## 2019-12-08 ENCOUNTER — Ambulatory Visit (INDEPENDENT_AMBULATORY_CARE_PROVIDER_SITE_OTHER): Payer: Medicare Other

## 2019-12-08 ENCOUNTER — Other Ambulatory Visit: Payer: Self-pay

## 2019-12-08 DIAGNOSIS — Z5181 Encounter for therapeutic drug level monitoring: Secondary | ICD-10-CM | POA: Diagnosis not present

## 2019-12-08 DIAGNOSIS — I340 Nonrheumatic mitral (valve) insufficiency: Secondary | ICD-10-CM | POA: Diagnosis not present

## 2019-12-08 DIAGNOSIS — Z953 Presence of xenogenic heart valve: Secondary | ICD-10-CM

## 2019-12-08 LAB — POCT INR: INR: 1.7 — AB (ref 2.0–3.0)

## 2019-12-08 NOTE — Patient Instructions (Addendum)
-   START NEW WARFARIN DOSAGE of 1.5 tablets every day. - Recheck INR in 5 weeks.

## 2019-12-09 ENCOUNTER — Ambulatory Visit (INDEPENDENT_AMBULATORY_CARE_PROVIDER_SITE_OTHER): Payer: Medicare Other | Admitting: *Deleted

## 2019-12-09 DIAGNOSIS — R001 Bradycardia, unspecified: Secondary | ICD-10-CM | POA: Diagnosis not present

## 2019-12-09 LAB — CUP PACEART REMOTE DEVICE CHECK
Battery Remaining Longevity: 60 mo
Battery Voltage: 3 V
Brady Statistic AP VP Percent: 0.1 %
Brady Statistic AP VS Percent: 99.25 %
Brady Statistic AS VP Percent: 0.11 %
Brady Statistic AS VS Percent: 0.54 %
Brady Statistic RA Percent Paced: 99.27 %
Brady Statistic RV Percent Paced: 0.22 %
Date Time Interrogation Session: 20210401064852
Implantable Lead Implant Date: 20160823
Implantable Lead Implant Date: 20160823
Implantable Lead Location: 753859
Implantable Lead Location: 753860
Implantable Lead Model: 5076
Implantable Lead Model: 5076
Implantable Pulse Generator Implant Date: 20160823
Lead Channel Impedance Value: 361 Ohm
Lead Channel Impedance Value: 380 Ohm
Lead Channel Impedance Value: 437 Ohm
Lead Channel Impedance Value: 456 Ohm
Lead Channel Pacing Threshold Amplitude: 0.5 V
Lead Channel Pacing Threshold Amplitude: 0.625 V
Lead Channel Pacing Threshold Pulse Width: 0.4 ms
Lead Channel Pacing Threshold Pulse Width: 0.4 ms
Lead Channel Sensing Intrinsic Amplitude: 1.75 mV
Lead Channel Sensing Intrinsic Amplitude: 1.75 mV
Lead Channel Sensing Intrinsic Amplitude: 10.875 mV
Lead Channel Sensing Intrinsic Amplitude: 10.875 mV
Lead Channel Setting Pacing Amplitude: 2 V
Lead Channel Setting Pacing Amplitude: 2.5 V
Lead Channel Setting Pacing Pulse Width: 0.4 ms
Lead Channel Setting Sensing Sensitivity: 2.8 mV

## 2019-12-09 NOTE — Progress Notes (Signed)
PPM Remote  

## 2020-01-12 ENCOUNTER — Ambulatory Visit (INDEPENDENT_AMBULATORY_CARE_PROVIDER_SITE_OTHER): Payer: Medicare Other

## 2020-01-12 ENCOUNTER — Other Ambulatory Visit: Payer: Self-pay

## 2020-01-12 DIAGNOSIS — I340 Nonrheumatic mitral (valve) insufficiency: Secondary | ICD-10-CM | POA: Diagnosis not present

## 2020-01-12 DIAGNOSIS — Z5181 Encounter for therapeutic drug level monitoring: Secondary | ICD-10-CM | POA: Diagnosis not present

## 2020-01-12 DIAGNOSIS — Z953 Presence of xenogenic heart valve: Secondary | ICD-10-CM

## 2020-01-12 LAB — POCT INR: INR: 2 (ref 2.0–3.0)

## 2020-01-12 NOTE — Patient Instructions (Signed)
-   Resume dosage of warfarin 1 tablet every day EXCEPT 1.5 tablets on MONDAYS, [place] - Don't have any greens for the next few days - Recheck INR in 6 weeks.

## 2020-02-09 ENCOUNTER — Other Ambulatory Visit: Payer: Self-pay | Admitting: Internal Medicine

## 2020-02-23 ENCOUNTER — Ambulatory Visit (INDEPENDENT_AMBULATORY_CARE_PROVIDER_SITE_OTHER): Payer: Medicare Other

## 2020-02-23 ENCOUNTER — Other Ambulatory Visit: Payer: Self-pay

## 2020-02-23 DIAGNOSIS — Z953 Presence of xenogenic heart valve: Secondary | ICD-10-CM

## 2020-02-23 DIAGNOSIS — Z5181 Encounter for therapeutic drug level monitoring: Secondary | ICD-10-CM

## 2020-02-23 DIAGNOSIS — I340 Nonrheumatic mitral (valve) insufficiency: Secondary | ICD-10-CM | POA: Diagnosis not present

## 2020-02-23 LAB — POCT INR: INR: 2.8 (ref 2.0–3.0)

## 2020-02-23 NOTE — Patient Instructions (Signed)
-   continue dosage of warfarin 1.5 tablets every day EXCEPT 1 tablets on MONDAYS, WEDNESDAYS & FRIDAYS - Recheck INR in 6 weeks. 

## 2020-03-03 ENCOUNTER — Other Ambulatory Visit: Payer: Self-pay | Admitting: Cardiovascular Disease

## 2020-03-03 NOTE — Telephone Encounter (Signed)
Refill request

## 2020-03-03 NOTE — Telephone Encounter (Signed)
Warfarin refill request was sent on 02/08/2020, called the pharmacy to confirm and they stated they did not have it. So gave a verbal for Warfarin 2.5mg  140 tablets with no refill which is a 90 day supply. Will deny this electronic refill since verbal order given.

## 2020-03-09 ENCOUNTER — Ambulatory Visit (INDEPENDENT_AMBULATORY_CARE_PROVIDER_SITE_OTHER): Payer: Medicare Other | Admitting: *Deleted

## 2020-03-09 DIAGNOSIS — I442 Atrioventricular block, complete: Secondary | ICD-10-CM | POA: Diagnosis not present

## 2020-03-09 LAB — CUP PACEART REMOTE DEVICE CHECK
Battery Remaining Longevity: 61 mo
Battery Voltage: 3 V
Brady Statistic AP VP Percent: 0.13 %
Brady Statistic AP VS Percent: 98.94 %
Brady Statistic AS VP Percent: 0.18 %
Brady Statistic AS VS Percent: 0.75 %
Brady Statistic RA Percent Paced: 98.76 %
Brady Statistic RV Percent Paced: 0.32 %
Date Time Interrogation Session: 20210701074420
Implantable Lead Implant Date: 20160823
Implantable Lead Implant Date: 20160823
Implantable Lead Location: 753859
Implantable Lead Location: 753860
Implantable Lead Model: 5076
Implantable Lead Model: 5076
Implantable Pulse Generator Implant Date: 20160823
Lead Channel Impedance Value: 361 Ohm
Lead Channel Impedance Value: 380 Ohm
Lead Channel Impedance Value: 399 Ohm
Lead Channel Impedance Value: 437 Ohm
Lead Channel Pacing Threshold Amplitude: 0.5 V
Lead Channel Pacing Threshold Amplitude: 0.625 V
Lead Channel Pacing Threshold Pulse Width: 0.4 ms
Lead Channel Pacing Threshold Pulse Width: 0.4 ms
Lead Channel Sensing Intrinsic Amplitude: 1.875 mV
Lead Channel Sensing Intrinsic Amplitude: 1.875 mV
Lead Channel Sensing Intrinsic Amplitude: 10.625 mV
Lead Channel Sensing Intrinsic Amplitude: 10.625 mV
Lead Channel Setting Pacing Amplitude: 2 V
Lead Channel Setting Pacing Amplitude: 2.5 V
Lead Channel Setting Pacing Pulse Width: 0.4 ms
Lead Channel Setting Sensing Sensitivity: 2.8 mV

## 2020-03-10 NOTE — Progress Notes (Signed)
Remote pacemaker transmission.   

## 2020-03-16 ENCOUNTER — Encounter: Payer: Self-pay | Admitting: Cardiovascular Disease

## 2020-03-16 ENCOUNTER — Other Ambulatory Visit: Payer: Self-pay

## 2020-03-16 ENCOUNTER — Ambulatory Visit (INDEPENDENT_AMBULATORY_CARE_PROVIDER_SITE_OTHER): Payer: Medicare Other | Admitting: Cardiovascular Disease

## 2020-03-16 VITALS — BP 140/62 | HR 74 | Ht 61.0 in | Wt 127.4 lb

## 2020-03-16 DIAGNOSIS — I4819 Other persistent atrial fibrillation: Secondary | ICD-10-CM

## 2020-03-16 DIAGNOSIS — I10 Essential (primary) hypertension: Secondary | ICD-10-CM

## 2020-03-16 DIAGNOSIS — E785 Hyperlipidemia, unspecified: Secondary | ICD-10-CM | POA: Diagnosis not present

## 2020-03-16 DIAGNOSIS — I5042 Chronic combined systolic (congestive) and diastolic (congestive) heart failure: Secondary | ICD-10-CM | POA: Diagnosis not present

## 2020-03-16 DIAGNOSIS — I2510 Atherosclerotic heart disease of native coronary artery without angina pectoris: Secondary | ICD-10-CM | POA: Diagnosis not present

## 2020-03-16 MED ORDER — SPIRONOLACTONE 25 MG PO TABS: 25.0000 mg | ORAL_TABLET | Freq: Every day | ORAL | 1 refills | Status: DC

## 2020-03-16 MED ORDER — ROSUVASTATIN CALCIUM 10 MG PO TABS: 10.0000 mg | ORAL_TABLET | Freq: Every day | ORAL | 3 refills | Status: DC

## 2020-03-16 NOTE — Progress Notes (Signed)
Cardiology Office Note   Date:  03/16/2020   ID:  HILJA KINTZEL, DOB Aug 07, 1935, MRN [id number]  PCP:  Ezequiel Kayser, MD  Cardiologist:   Kathlyn Sacramento, MD   Chief Complaint  Patient presents with  . office visit    Pt has no complaints. Meds verbally reviewed w/ pt.      History of Present Illness: [name] is a 85 y.o. female who presents for a follow-up visit regarding chronic systolic heart failure, atrial fibrillation and mitral regurgitation.  [name] has known history of COPD, breast cancer status post right partial mastectomy, hypothyroidism, hyperlipidemia and Mnire disease.   [name] underwent mitral valve replacement with a bioprosthetic valve, tricuspid valve repair, one-vessel CABG with LIMA to LAD and maze procedure in August 2016. This was complicated by sternal wound infection which required debridement. [name] also had complete heart block and underwent permanent pacemaker placement.  In 08/2017, [name] had worsening heart failure.  Echocardiogram showed an EF of 30 to 35%.[name] was seen by EP and it was felt that LV dysfunction was in the context of RV apical pacing. That was inactivated and [name] noted immediate improvement in symptoms. [name] again had worsening heart failure symptoms in late 2019.  [name] had a drop in EF to 20 to 25% in the setting of atrial fibrillation with RVR.  [name] was rate controlled and started on amiodarone and subsequently underwent cardioversion with restoration of sinus rhythm.  EF improved to 35 to 40% after that.   Due to continued symptoms of heart failure, right heart catheterization was done in June of this year which showed moderately elevated filling pressures, severe pulmonary hypertension and mildly reduced cardiac output.  There was no evidence of left-to-right intracardiac shunting by saturation run.  Prominent V waves were noted on pulmonary capillary wedge pressure tracing suggestive of significant mitral regurgitation.  Symptoms improved after  increasing furosemide.    [name] was hospitalized with COVID-19 infection in June but had mild disease overall.   [name] underwent attempted upgrade of her pacemaker to a biventricular device but the procedure was not successful due to inability to place an LV lead.  During last visit, the dose of Entresto was doubled.  [name] has been tolerating the dose with no hypotension.  [name] feels well overall with no chest pain or significant dyspnea.  No significant leg edema.  Her weight has been stable.    Past Medical History:  Diagnosis Date  . Anxiety   . Arthritis    "hands" (11/10/2015)  . Breast cancer ([place]) 2002   right  . CAD s/p CABG x 1    a. 04/2015 LIMA to LAD  . Chronic combined systolic (congestive) and diastolic (congestive) heart failure ([place])    a. 01/2015 Echo: EF 50-55%, Gr2 DD (preop valve surgery); b. 08/2015 Echo: EF 30-35%, diff HK. Gr2 DD; c. 09/2016 Echo: EF 50-55%; d. 07/2018 Echo: EF 20-25%; e. 03/2019 Echo: EF 35-40%, diff HK. Mod red RV fxn, RVSP 34.70mmHg. Mildly dil LA. Nl MV prosthesis. Triv AI.  Marland Kitchen COPD (chronic obstructive pulmonary disease) ([place])   . Coronary artery disease   . GERD (gastroesophageal reflux disease)   . History of colon polyps   . History of mitral valve replacement    a. 04/2015 s/p 27 mm [facility] Mitral bovine bioprosthetic tissue valve  . Hyperlipidemia   . Hypertension   . Hypothyroidism   . 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  04/2015 Complete bilateral atrial lesion set using cryothermy and bipolar radiofrequency ablation with clipping of LA appendage  . Meniere disease   . Meniere's disease   . On home oxygen therapy    "2L at night" (11/10/2015)  . PAF (paroxysmal atrial fibrillation) ([place])   . Personal history of radiation therapy   . Pneumonia ~ 2010  . PONV (postoperative nausea and vomiting)    Pt felt like eardrums were gonna pop  . Post-surgical complete heart block, symptomatic    a. 04/2015 s/p MDT [id number] [address]visa DR MRI DC  PPM.  . Presence of permanent cardiac pacemaker   . Pulmonary hypertension ([place])   . Surgical wound, non healing - chest wall 11/10/2015  . Tricuspid Regurgitation s/p Repair    a. 04/2015 s/p 26 mm Edwards mc3 ring annuloplasty  . Wound infection 10/13/2015   Superficial sternal wound infection    Past Surgical History:  Procedure Laterality Date  . APPENDECTOMY    . APPLICATION OF A-CELL OF CHEST/ABDOMEN N/A 11/21/2015   Procedure: APPLICATION OF A-CELL OF CHEST/ABDOMEN;  Surgeon: Loel Lofty Dillingham, DO;  Location: [place];  Service: Plastics;  Laterality: N/A;  . APPLICATION OF A-CELL OF CHEST/ABDOMEN Right 12/21/2015   Procedure: APPLICATION OF A-CELL OF RIGHT CHEST;  Surgeon: Loel Lofty Dillingham, DO;  Location: [place];  Service: Plastics;  Laterality: Right;  . APPLICATION OF A-CELL OF CHEST/ABDOMEN Right 01/11/2016   Procedure: APPLICATION OF A-CELL OF RIGHT CHEST;  Surgeon: Loel Lofty Dillingham, DO;  Location: [place];  Service: Plastics;  Laterality: Right;  . APPLICATION OF A-CELL OF CHEST/ABDOMEN Right 02/12/2016   Procedure: APPLICATION OF A-CELL TO RIGHT CHEST WOUND;  Surgeon: Loel Lofty Dillingham, DO;  Location: [place];  Service: Plastics;  Laterality: Right;  . APPLICATION OF WOUND VAC N/A 05/09/2015   Procedure: APPLICATION OF WOUND VAC;  Surgeon: Rexene Alberts, MD;  Location: [place];  Service: Thoracic;  Laterality: N/A;  . APPLICATION OF WOUND VAC N/A 11/13/2015   Procedure: APPLICATION OF WOUND VAC;  Surgeon: Rexene Alberts, MD;  Location: [place];  Service: Thoracic;  Laterality: N/A;  . APPLICATION OF WOUND VAC N/A 11/21/2015   Procedure: APPLICATION OF WOUND VAC;  Surgeon: Loel Lofty Dillingham, DO;  Location: [place];  Service: Plastics;  Laterality: N/A;  . APPLICATION OF WOUND VAC Right 12/21/2015   Procedure: APPLICATION OF WOUND VAC to right chest wall ;  Surgeon: Loel Lofty Dillingham, DO;  Location: [place];  Service: Plastics;  Laterality: Right;  . APPLICATION OF WOUND VAC Right 01/11/2016     Procedure: APPLICATION OF WOUND VAC RIGHT CHEST ;  Surgeon: Loel Lofty Dillingham, DO;  Location: [place];  Service: Plastics;  Laterality: Right;  . APPLICATION OF WOUND VAC Right 01/24/2016   Procedure: APPLICATION OF WOUND VAC RIGHT CHEST WALL;  Surgeon: Loel Lofty Dillingham, DO;  Location: [place];  Service: Plastics;  Laterality: Right;  . APPLICATION OF WOUND VAC Right 02/12/2016   Procedure: RE-APPLICATION OF WOUND VAC TO RIGHT CHEST WOUND;  Surgeon: Loel Lofty Dillingham, DO;  Location: [place];  Service: Plastics;  Laterality: Right;  . BIV UPGRADE N/A 06/07/2019   Procedure: BIV UPGRADE;  Surgeon: Deboraha Sprang, MD;  Location: [place] [facility];  Service: Cardiovascular;  Laterality: N/A;  . BREAST BIOPSY Left 11/25/06   neg  . BREAST BIOPSY Left 01/20/12   /clip-neg  . CARDIAC CATHETERIZATION  11/2013   [facility]  . CARDIAC CATHETERIZATION  10/2014  [facility]  . CARDIAC VALVE REPLACEMENT    . CARDIOVERSION N/A 07/20/2018   Procedure: CARDIOVERSION;  Surgeon: Wellington Hampshire, MD;  Location: [facility];  Service: Cardiovascular;  Laterality: N/A;  . CATARACT EXTRACTION W/ INTRAOCULAR LENS  IMPLANT, BILATERAL Bilateral 2014  . CLIPPING OF ATRIAL APPENDAGE N/A 04/25/2015   Procedure: CLIPPING OF ATRIAL APPENDAGE;  Surgeon: Rexene Alberts, MD;  Location: [place];  Service: Open Heart Surgery;  Laterality: N/A;  . COCHLEAR IMPLANT Left 2005?  . COLONOSCOPY WITH PROPOFOL N/A 01/20/2018   Procedure: COLONOSCOPY WITH PROPOFOL;  Surgeon: Toledo, Benay Pike, MD;  Location: [facility];  Service: Gastroenterology;  Laterality: N/A;  . CORONARY ANGIOPLASTY    . CORONARY ARTERY BYPASS GRAFT N/A 04/25/2015   Procedure: CORONARY ARTERY BYPASS GRAFTING (CABG) x ONE, using left internal mammary artery;  Surgeon: Rexene Alberts, MD;  Location: [place];  Service: Open Heart Surgery;  Laterality: N/A;  . DILATION AND CURETTAGE OF UTERUS  "several before hysterectomy"  . EP IMPLANTABLE DEVICE N/A 05/02/2015   Procedure:  Pacemaker Implant;  Surgeon: Thompson Grayer, MD;  Location: [place] [facility];  Service: Cardiovascular;  Laterality: N/A;  . ESOPHAGOGASTRODUODENOSCOPY (EGD) WITH PROPOFOL N/A 01/20/2018   Procedure: ESOPHAGOGASTRODUODENOSCOPY (EGD) WITH PROPOFOL;  Surgeon: Toledo, Benay Pike, MD;  Location: [facility];  Service: Gastroenterology;  Laterality: N/A;  . EYE SURGERY    . I & D EXTREMITY Right 12/06/2015   Procedure: IRRIGATION AND DEBRIDEMENT RIGHT CHEST WALL WITH ACELL PLACEMENT AND VAC;  Surgeon: Loel Lofty Dillingham, DO;  Location: [place];  Service: Plastics;  Laterality: Right;  . INCISION AND DRAINAGE OF WOUND N/A 11/21/2015   Procedure: IRRIGATION AND DEBRIDEMENT WOUND;  Surgeon: Loel Lofty Dillingham, DO;  Location: [place];  Service: Plastics;  Laterality: N/A;  . INCISION AND DRAINAGE OF WOUND Right 12/21/2015   Procedure: IRRIGATION AND DEBRIDEMENT right chest wall WOUND;  Surgeon: Loel Lofty Dillingham, DO;  Location: [place];  Service: Plastics;  Laterality: Right;  right chest wall   . INCISION AND DRAINAGE OF WOUND Right 01/24/2016   Procedure: IRRIGATION AND DEBRIDEMENT RIGHT CHEST WALL WOUND;  Surgeon: Loel Lofty Dillingham, DO;  Location: [place];  Service: Plastics;  Laterality: Right;  . INCISION AND DRAINAGE OF WOUND Right 03/28/2016   Procedure: IRRIGATION AND DEBRIDEMENT RIGHT CHEST WALL WOUND WITH A Cell Placement;  Surgeon: Loel Lofty Dillingham, DO;  Location: [place];  Service: Plastics;  Laterality: Right;  . INCISION AND DRAINAGE OF WOUND Right 05/17/2016   Procedure: IRRIGATION AND DEBRIDEMENT OF RIGHT CHEST WOUND WITH A CELL PLACEMENT;  Surgeon: Loel Lofty Dillingham, DO;  Location: [place];  Service: Plastics;  Laterality: Right;  . INSERT / REPLACE / REMOVE PACEMAKER    . IRRIGATION AND DEBRIDEMENT OF WOUND WITH SPLIT THICKNESS SKIN GRAFT Right 01/11/2016   Procedure: IRRIGATION AND DEBRIDEMENT OF RIGHT CHEST WOUND ;  Surgeon: Loel Lofty Dillingham, DO;  Location: [place];  Service: Plastics;   Laterality: Right;  . MASTECTOMY, PARTIAL Right 2002   positive  . MAZE N/A 04/25/2015   Procedure: MAZE;  Surgeon: Rexene Alberts, MD;  Location: [place];  Service: Open Heart Surgery;  Laterality: N/A;  . MITRAL VALVE REPAIR N/A 04/25/2015   Procedure: MITRAL VALVE  REPLACEMENT using a 27 mm Edwards Perimount Magna Mitral Ease Valve;  Surgeon: Rexene Alberts, MD;  Location: [place];  Service: Open Heart Surgery;  Laterality: N/A;  . RIGHT HEART CATH N/A 02/08/2019  Procedure: RIGHT HEART CATH;  Surgeon: Wellington Hampshire, MD;  Location: [place] [facility];  Service: Cardiovascular;  Laterality: N/A;  . SKIN SPLIT GRAFT Right 02/12/2016   Procedure: IRRIGATION AND DEBRIDEMENT RIGHT CHEST WOUND;  Surgeon: Loel Lofty Dillingham, DO;  Location: [place];  Service: Plastics;  Laterality: Right;  . STERNAL WIRES REMOVAL N/A 06/05/2015   Procedure: STERNAL WIRES REMOVAL;  Surgeon: Rexene Alberts, MD;  Location: [place];  Service: Thoracic;  Laterality: N/A;  . STERNAL WOUND DEBRIDEMENT N/A 05/09/2015   Procedure: STERNAL WOUND DEBRIDEMENT;  Surgeon: Rexene Alberts, MD;  Location: [place];  Service: Thoracic;  Laterality: N/A;  . STERNAL WOUND DEBRIDEMENT N/A 11/13/2015   Procedure: Excisional drainage of RIGHT Chest wall mass and breast mass ;  Surgeon: Rexene Alberts, MD;  Location: [place];  Service: Thoracic;  Laterality: N/A;  . TEE WITHOUT CARDIOVERSION N/A 04/25/2015   Procedure: TRANSESOPHAGEAL ECHOCARDIOGRAM (TEE);  Surgeon: Rexene Alberts, MD;  Location: [place];  Service: Open Heart Surgery;  Laterality: N/A;  . TONSILLECTOMY    . TRAM Right 11/18/2015   Procedure: VRAM Vertical Rectus Abdominus Muscle Flap;  Surgeon: Loel Lofty Dillingham, DO;  Location: [place];  Service: Plastics;  Laterality: Right;  RIght Back  . TRICUSPID VALVE REPLACEMENT N/A 04/25/2015   Procedure: TRICUSPID VALVE REPAIR;  Surgeon: Rexene Alberts, MD;  Location: [place];  Service: Open Heart Surgery;  Laterality: N/A;  . VAGINAL  HYSTERECTOMY       Current Outpatient Medications  Medication Sig Dispense Refill  . acetaminophen (TYLENOL) 500 MG tablet Take 1,000 mg by mouth every 6 (six) hours as needed (for pain.).    Marland Kitchen AMBULATORY NON FORMULARY MEDICATION Inogen at Home Oxygen machine with tubing. DX: COPD DX Code: J44.9 1 each 0  . amiodarone (PACERONE) 200 MG tablet TAKE 1/2 TABLET BY MOUTH EVERY DAY 45 tablet 3  . aspirin EC 81 MG EC tablet Take 1 tablet (81 mg total) by mouth daily.    . bisoprolol (ZEBETA) 5 MG tablet TAKE 1 TABLET BY MOUTH EVERY DAY 90 tablet 2  . diphenhydrAMINE (BENADRYL) 25 mg capsule Take 25 mg by mouth every 6 (six) hours as needed for allergies.    Marland Kitchen docusate sodium (COLACE) 100 MG capsule Take 100 mg by mouth 2 (two) times daily as needed (constipation.).    Marland Kitchen furosemide (LASIX) 20 MG tablet Take 20-40 mg by mouth See admin instructions. Take 2 tablets (40 mg) by mouth in the morning & take 1 tablet (20 mg) by mouth in the afternoon.    . gabapentin (NEURONTIN) 100 MG capsule Take 200 mg by mouth at bedtime.   1  . ipratropium-albuterol (DUONEB) 0.5-2.5 (3) MG/3ML SOLN Take 3 mLs by nebulization every 6 (six) hours as needed. DX:J44.9 (Patient taking differently: Take 3 mLs by nebulization every 6 (six) hours as needed (with cold symptoms.). DX:J44.9) 1080 mL 1  . levothyroxine (SYNTHROID, LEVOTHROID) 88 MCG tablet Take 88 mcg by mouth daily before breakfast.    . Multiple Vitamins-Minerals (PRESERVISION AREDS 2 PO) Take 1 tablet by mouth 2 (two) times daily.     . pantoprazole (PROTONIX) 40 MG tablet Take 40 mg by mouth 2 (two) times daily.     . sacubitril-valsartan (ENTRESTO) 49-51 MG Take 1 tablet (49/51 mg) by mouth twice daily 60 tablet 6  . simvastatin (ZOCOR) 10 MG tablet TAKE 1 TABLET BY MOUTH EVERYDAY AT BEDTIME 90 tablet 0  . spironolactone (  ALDACTONE) 25 MG tablet Take 0.5 tablets (12.5 mg total) by mouth daily. 45 tablet 3  . warfarin (COUMADIN) 2.5 MG tablet TAKE AS DIRECTED  BY COUMADIN CLINIC 140 tablet 0   No current facility-administered medications for this visit.    Allergies:   Ace inhibitors, Amoxicillin-pot clavulanate, Penicillins, Nifedipine, Propranolol, Diazepam, Meperidine, and Propoxyphene    Social History:  The patient  reports that [name] quit smoking about 21 years ago. Her smoking use included cigarettes. [name] has a 60.00 pack-year smoking history. [name] has never used smokeless tobacco. [name] reports current alcohol use. [name] reports that [name] does not use drugs.   Family History:  The patient's family history includes Breast cancer in her maternal aunt and paternal aunt; CVA in her mother; Heart attack in her paternal uncle; Heart disease in her brother and father.    ROS:  Please see the history of present illness.   Otherwise, review of systems are positive for none.   All other systems are reviewed and negative.    PHYSICAL EXAM: VS:  BP 140/62 (BP Location: Left Arm, Patient Position: Sitting, Cuff Size: Normal)   Pulse 74   Ht 5\' 1"  (1.549 m)   Wt 127 lb 6 oz (57.8 kg)   SpO2 98%   BMI 24.07 kg/m  , BMI Body mass index is 24.07 kg/m. GEN: Well nourished, well developed, in no acute distress  HEENT: normal  Neck: no JVD, carotid bruits, or masses Cardiac: RRR; no murmurs, rubs, or gallops, trace edema  Respiratory:  clear to auscultation bilaterally, normal work of breathing GI: soft, nontender, nondistended, + BS MS: no deformity or atrophy  Skin: warm and dry, no rash Neuro:  Strength and sensation are intact Psych: euthymic mood, full affect   EKG:  EKG is ordered today. The ekg ordered today demonstrates atrial paced rhythm.  Recent Labs: 06/03/2019: Hemoglobin 11.4; Platelets 156 09/14/2019: ALT 13 12/01/2019: BUN 21; Creatinine, Ser 0.91; Potassium 3.6; Sodium 145    Lipid Panel    Component Value Date/Time   TRIG 48 02/22/2019 1100      Wt Readings from Last 3 Encounters:  03/16/20 127 lb 6 oz (57.8 kg)  11/24/19  130 lb (59 kg)  10/18/19 128 lb 6.4 oz (58.2 kg)       PAD Screen 04/05/2016  Previous PAD dx? No  Previous surgical procedure? No  Pain with walking? No  Feet/toe relief with dangling? No  Painful, non-healing ulcers? No  Extremities discolored? No      ASSESSMENT AND PLAN:   1.  Chronic systolic heart failure: [name] appears to be euvolemic on current dose of furosemide.  [name] is on optimal medical therapy including  Entresto, bisoprolol and spironolactone. I increased spironolactone to 25 mg once daily.  If blood pressure remains on the high side, the dose of Entresto can be increased upon follow-up.  Check basic metabolic profile with next visit in 4 months.  2. Status post mitral valve replacement with a bioprosthetic valve: This was intact on most recent echocardiogram.  3. Coronary artery disease status post one-vessel LIMA to LAD: No anginal symptoms  4. Persistent atrial fibrillation: [name] seems to be maintaining in sinus rhythm with amiodarone.  Continue long-term anticoagulation with warfarin.  Recent labs showed normal TSH and liver enzymes.  5.  Hyperlipidemia: Currently on simvastatin.  However, recent labs showed an LDL of 98.  The dose of simvastatin cannot be increased due to interaction with amiodarone and thus I elected  to switch her to rosuvastatin 10 mg daily.  We will check lipid and liver profile with her next visit in 4 months.  6.  Status post permanent pacemaker placement: Followed by Dr. Caryl Comes.  Unsuccessful upgrade to biventricular pacemaker due to inability to place an LV lead.    Disposition:   FU with me in 4 months  Signed,  Kathlyn Sacramento, MD  03/16/2020 4:17 PM    [facility]

## 2020-03-16 NOTE — Patient Instructions (Signed)
Medication Instructions:  Your physician has recommended you make the following change in your medication:   1) INCREASE Spironolactone to 25 mg daily. An Rx has been sent to your pharmacy.  2) STOP Simvastatin  3) START Rosuvastatin (crestor) 10 mg daily. An Rx has been sent to your pharmacy.  *If you need a refill on your cardiac medications before your next appointment, please call your pharmacy*   Lab Work: Lipid and CMP to be drawn in 4 months at your next appointment.  If you have labs (blood work) drawn today and your tests are completely normal, you will receive your results only by: Marland Kitchen MyChart Message (if you have MyChart) OR . A paper copy in the mail If you have any lab test that is abnormal or we need to change your treatment, we will call you to review the results.   Testing/Procedures: None ordered   Follow-Up: At [facility], you and your health needs are our priority.  As part of our continuing mission to provide you with exceptional heart care, we have created designated Provider Care Teams.  These Care Teams include your primary Cardiologist (physician) and Advanced Practice Providers (APPs -  Physician Assistants and Nurse Practitioners) who all work together to provide you with the care you need, when you need it.  We recommend signing up for the patient portal called "MyChart".  Sign up information is provided on this After Visit Summary.  MyChart is used to connect with patients for Virtual Visits (Telemedicine).  Patients are able to view lab/test results, encounter notes, upcoming appointments, etc.  Non-urgent messages can be sent to your provider as well.   To learn more about what you can do with MyChart, go to [email or web address].    Your next appointment:   4 month(s)  The format for your next appointment:   In Person  Provider:    You may see Kathlyn Sacramento, MD or one of the following Advanced Practice Providers on your designated Care Team:     Murray Hodgkins, NP  Christell Faith, PA-C  Marrianne Mood, PA-C    Other Instructions N/A

## 2020-03-21 ENCOUNTER — Encounter: Payer: Self-pay | Admitting: Internal Medicine

## 2020-03-21 ENCOUNTER — Ambulatory Visit (INDEPENDENT_AMBULATORY_CARE_PROVIDER_SITE_OTHER): Payer: Medicare Other | Admitting: Internal Medicine

## 2020-03-21 ENCOUNTER — Other Ambulatory Visit: Payer: Self-pay

## 2020-03-21 ENCOUNTER — Telehealth: Payer: Self-pay | Admitting: *Deleted

## 2020-03-21 VITALS — BP 130/58 | HR 65 | Ht 61.0 in | Wt 127.0 lb

## 2020-03-21 DIAGNOSIS — I4819 Other persistent atrial fibrillation: Secondary | ICD-10-CM

## 2020-03-21 DIAGNOSIS — I495 Sick sinus syndrome: Secondary | ICD-10-CM

## 2020-03-21 DIAGNOSIS — I2510 Atherosclerotic heart disease of native coronary artery without angina pectoris: Secondary | ICD-10-CM | POA: Diagnosis not present

## 2020-03-21 DIAGNOSIS — Z950 Presence of cardiac pacemaker: Secondary | ICD-10-CM | POA: Diagnosis not present

## 2020-03-21 NOTE — Progress Notes (Signed)
See reply Patient Care Team: Ezequiel Kayser, MD as PCP - General (Internal Medicine) Wellington Hampshire, MD as PCP - Cardiology (Cardiology) Deboraha Sprang, MD as PCP - Electrophysiology (Cardiology) Alisa Graff, FNP as Nurse Practitioner (Family Medicine)   HPI  [name] is a 85 y.o. female Seen in follow-up for a pacemaker Medtronic implanted 8/16 [facility])  in the context of multi-valvular surgery bioprosthetic mitral valve replacement, tricuspid valve repair and MAZE operation w postoperative complete heart block; CRT upgrade attempted (SK)  failed  9/20  Persistent atrial fibrillation;  anticoagulated with Coumadin and aspirin and Rx with amio (started 11/19)  Much improved dyspnea over the last 6-12 months.  Concurrent with treating her anemia as well as Entresto.  Better than she has been in some time.  Also using 24-hour oxygen.  No cough.  Seems to be tolerating amiodarone.  No edema or chest pain.  DATE TEST EF   5/16 Echo   65 %   12/16 Echo   30-35 %   1/18 Echo  55%   11/19 Echo  20-25%   12/19 Echo  35%   6/20 [facility]  PCWP 23 RA  7/20 Echo  35%      pulm note 8/20 reviewed        Date Cr K Hgb TSH LFTs  8/17     9.2    5/18 0.8  11.5    5/19 0.8 3.9 13.5 (MCV 103<<89)    11/19 0.8 3.9 9.9 2.168 43-  6/20 0.84 4.9 8.0 2.860 (2/20) 51  11/20 0.74 4.2 11.8 2.119   6/21 0.8 3.8 13.1 1.039      Hx of recurrent anemia and has had Fe replacement, but not sure if there is a specific diagnosis    Past Medical History:  Diagnosis Date  . Anxiety   . Arthritis    "hands" (11/10/2015)  . Breast cancer ([place]) 2002   right  . CAD s/p CABG x 1    a. 04/2015 LIMA to LAD  . Chronic combined systolic (congestive) and diastolic (congestive) heart failure ([place])    a. 01/2015 Echo: EF 50-55%, Gr2 DD (preop valve surgery); b. 08/2015 Echo: EF 30-35%, diff HK. Gr2 DD; c. 09/2016 Echo: EF 50-55%; d. 07/2018 Echo: EF 20-25%; e. 03/2019 Echo: EF 35-40%, diff HK. Mod  red RV fxn, RVSP 34.11mmHg. Mildly dil LA. Nl MV prosthesis. Triv AI.  Marland Kitchen COPD (chronic obstructive pulmonary disease) ([place])   . Coronary artery disease   . GERD (gastroesophageal reflux disease)   . History of colon polyps   . History of mitral valve replacement    a. 04/2015 s/p 27 mm [facility] Mitral bovine bioprosthetic tissue valve  . Hyperlipidemia   . Hypertension   . Hypothyroidism   . 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 04/2015 Complete bilateral atrial lesion set using cryothermy and bipolar radiofrequency ablation with clipping of LA appendage  . Meniere disease   . Meniere's disease   . On home oxygen therapy    "2L at night" (11/10/2015)  . PAF (paroxysmal atrial fibrillation) ([place])   . Personal history of radiation therapy   . Pneumonia ~ 2010  . PONV (postoperative nausea and vomiting)    Pt felt like eardrums were gonna pop  . Post-surgical complete heart block, symptomatic    a. 04/2015 s/p MDT [id number] [place] DR MRI DC PPM.  . Presence of permanent cardiac  pacemaker   . Pulmonary hypertension ([place])   . Surgical wound, non healing - chest wall 11/10/2015  . Tricuspid Regurgitation s/p Repair    a. 04/2015 s/p 26 mm Edwards mc3 ring annuloplasty  . Wound infection 10/13/2015   Superficial sternal wound infection    Past Surgical History:  Procedure Laterality Date  . APPENDECTOMY    . APPLICATION OF A-CELL OF CHEST/ABDOMEN N/A 11/21/2015   Procedure: APPLICATION OF A-CELL OF CHEST/ABDOMEN;  Surgeon: Loel Lofty Dillingham, DO;  Location: [place];  Service: Plastics;  Laterality: N/A;  . APPLICATION OF A-CELL OF CHEST/ABDOMEN Right 12/21/2015   Procedure: APPLICATION OF A-CELL OF RIGHT CHEST;  Surgeon: Loel Lofty Dillingham, DO;  Location: [place];  Service: Plastics;  Laterality: Right;  . APPLICATION OF A-CELL OF CHEST/ABDOMEN Right 01/11/2016   Procedure: APPLICATION OF A-CELL OF RIGHT CHEST;  Surgeon: Loel Lofty Dillingham, DO;  Location: [place];  Service: Plastics;   Laterality: Right;  . APPLICATION OF A-CELL OF CHEST/ABDOMEN Right 02/12/2016   Procedure: APPLICATION OF A-CELL TO RIGHT CHEST WOUND;  Surgeon: Loel Lofty Dillingham, DO;  Location: [place];  Service: Plastics;  Laterality: Right;  . APPLICATION OF WOUND VAC N/A 05/09/2015   Procedure: APPLICATION OF WOUND VAC;  Surgeon: Rexene Alberts, MD;  Location: [place];  Service: Thoracic;  Laterality: N/A;  . APPLICATION OF WOUND VAC N/A 11/13/2015   Procedure: APPLICATION OF WOUND VAC;  Surgeon: Rexene Alberts, MD;  Location: [place];  Service: Thoracic;  Laterality: N/A;  . APPLICATION OF WOUND VAC N/A 11/21/2015   Procedure: APPLICATION OF WOUND VAC;  Surgeon: Loel Lofty Dillingham, DO;  Location: [place];  Service: Plastics;  Laterality: N/A;  . APPLICATION OF WOUND VAC Right 12/21/2015   Procedure: APPLICATION OF WOUND VAC to right chest wall ;  Surgeon: Loel Lofty Dillingham, DO;  Location: [place];  Service: Plastics;  Laterality: Right;  . APPLICATION OF WOUND VAC Right 01/11/2016   Procedure: APPLICATION OF WOUND VAC RIGHT CHEST ;  Surgeon: Loel Lofty Dillingham, DO;  Location: [place];  Service: Plastics;  Laterality: Right;  . APPLICATION OF WOUND VAC Right 01/24/2016   Procedure: APPLICATION OF WOUND VAC RIGHT CHEST WALL;  Surgeon: Loel Lofty Dillingham, DO;  Location: [place];  Service: Plastics;  Laterality: Right;  . APPLICATION OF WOUND VAC Right 02/12/2016   Procedure: RE-APPLICATION OF WOUND VAC TO RIGHT CHEST WOUND;  Surgeon: Loel Lofty Dillingham, DO;  Location: [place];  Service: Plastics;  Laterality: Right;  . BIV UPGRADE N/A 06/07/2019   Procedure: BIV UPGRADE;  Surgeon: Deboraha Sprang, MD;  Location: [place] [facility];  Service: Cardiovascular;  Laterality: N/A;  . BREAST BIOPSY Left 11/25/06   neg  . BREAST BIOPSY Left 01/20/12   /clip-neg  . CARDIAC CATHETERIZATION  11/2013   [facility]  . CARDIAC CATHETERIZATION  10/2014   [facility]  . CARDIAC VALVE REPLACEMENT    . CARDIOVERSION N/A 07/20/2018   Procedure:  CARDIOVERSION;  Surgeon: Wellington [place], MD;  Location: [facility];  Service: Cardiovascular;  Laterality: N/A;  . CATARACT EXTRACTION W/ INTRAOCULAR LENS  IMPLANT, BILATERAL Bilateral 2014  . CLIPPING OF ATRIAL APPENDAGE N/A 04/25/2015   Procedure: CLIPPING OF ATRIAL APPENDAGE;  Surgeon: Rexene Alberts, MD;  Location: [place];  Service: Open Heart Surgery;  Laterality: N/A;  . COCHLEAR IMPLANT Left 2005?  . COLONOSCOPY WITH PROPOFOL N/A 01/20/2018   Procedure: COLONOSCOPY WITH PROPOFOL;  Surgeon: Alice Reichert, Benay Pike, MD;  Location: [facility];  Service: Gastroenterology;  Laterality: N/A;  . CORONARY ANGIOPLASTY    . CORONARY ARTERY BYPASS GRAFT N/A 04/25/2015   Procedure: CORONARY ARTERY BYPASS GRAFTING (CABG) x ONE, using left internal mammary artery;  Surgeon: Rexene Alberts, MD;  Location: [place];  Service: Open Heart Surgery;  Laterality: N/A;  . DILATION AND CURETTAGE OF UTERUS  "several before hysterectomy"  . EP IMPLANTABLE DEVICE N/A 05/02/2015   Procedure: Pacemaker Implant;  Surgeon: Thompson Grayer, MD;  Location: [place] [facility];  Service: Cardiovascular;  Laterality: N/A;  . ESOPHAGOGASTRODUODENOSCOPY (EGD) WITH PROPOFOL N/A 01/20/2018   Procedure: ESOPHAGOGASTRODUODENOSCOPY (EGD) WITH PROPOFOL;  Surgeon: [place], Benay Pike, MD;  Location: [facility];  Service: Gastroenterology;  Laterality: N/A;  . EYE SURGERY    . I & D EXTREMITY Right 12/06/2015   Procedure: IRRIGATION AND DEBRIDEMENT RIGHT CHEST WALL WITH ACELL PLACEMENT AND VAC;  Surgeon: Loel Lofty Dillingham, DO;  Location: [place];  Service: Plastics;  Laterality: Right;  . INCISION AND DRAINAGE OF WOUND N/A 11/21/2015   Procedure: IRRIGATION AND DEBRIDEMENT WOUND;  Surgeon: Loel Lofty Dillingham, DO;  Location: [place];  Service: Plastics;  Laterality: N/A;  . INCISION AND DRAINAGE OF WOUND Right 12/21/2015   Procedure: IRRIGATION AND DEBRIDEMENT right chest wall WOUND;  Surgeon: Loel Lofty Dillingham, DO;  Location: [place];  Service:  Plastics;  Laterality: Right;  right chest wall   . INCISION AND DRAINAGE OF WOUND Right 01/24/2016   Procedure: IRRIGATION AND DEBRIDEMENT RIGHT CHEST WALL WOUND;  Surgeon: Loel Lofty Dillingham, DO;  Location: [place];  Service: Plastics;  Laterality: Right;  . INCISION AND DRAINAGE OF WOUND Right 03/28/2016   Procedure: IRRIGATION AND DEBRIDEMENT RIGHT CHEST WALL WOUND WITH A Cell Placement;  Surgeon: Loel Lofty Dillingham, DO;  Location: [place];  Service: Plastics;  Laterality: Right;  . INCISION AND DRAINAGE OF WOUND Right 05/17/2016   Procedure: IRRIGATION AND DEBRIDEMENT OF RIGHT CHEST WOUND WITH A CELL PLACEMENT;  Surgeon: Loel Lofty Dillingham, DO;  Location: [place];  Service: Plastics;  Laterality: Right;  . INSERT / REPLACE / REMOVE PACEMAKER    . IRRIGATION AND DEBRIDEMENT OF WOUND WITH SPLIT THICKNESS SKIN GRAFT Right 01/11/2016   Procedure: IRRIGATION AND DEBRIDEMENT OF RIGHT CHEST WOUND ;  Surgeon: Loel Lofty Dillingham, DO;  Location: [place];  Service: Plastics;  Laterality: Right;  . MASTECTOMY, PARTIAL Right 2002   positive  . MAZE N/A 04/25/2015   Procedure: MAZE;  Surgeon: Rexene Alberts, MD;  Location: [place];  Service: Open Heart Surgery;  Laterality: N/A;  . MITRAL VALVE REPAIR N/A 04/25/2015   Procedure: MITRAL VALVE  REPLACEMENT using a 27 mm Edwards Perimount Magna Mitral Ease Valve;  Surgeon: Rexene Alberts, MD;  Location: [place];  Service: Open Heart Surgery;  Laterality: N/A;  . RIGHT HEART CATH N/A 02/08/2019   Procedure: RIGHT HEART CATH;  Surgeon: Wellington [place], MD;  Location: [place] [facility];  Service: Cardiovascular;  Laterality: N/A;  . SKIN SPLIT GRAFT Right 02/12/2016   Procedure: IRRIGATION AND DEBRIDEMENT RIGHT CHEST WOUND;  Surgeon: Loel Lofty Dillingham, DO;  Location: [place];  Service: Plastics;  Laterality: Right;  . STERNAL WIRES REMOVAL N/A 06/05/2015   Procedure: STERNAL WIRES REMOVAL;  Surgeon: Rexene Alberts, MD;  Location: [place];  Service: Thoracic;  Laterality:  N/A;  . STERNAL WOUND DEBRIDEMENT N/A 05/09/2015   Procedure: STERNAL WOUND DEBRIDEMENT;  Surgeon: Rexene Alberts, MD;  Location:  [place] [facility];  Service: Thoracic;  Laterality: N/A;  . STERNAL WOUND DEBRIDEMENT N/A 11/13/2015   Procedure: Excisional drainage of RIGHT Chest wall mass and breast mass ;  Surgeon: Rexene Alberts, MD;  Location: [place];  Service: Thoracic;  Laterality: N/A;  . TEE WITHOUT CARDIOVERSION N/A 04/25/2015   Procedure: TRANSESOPHAGEAL ECHOCARDIOGRAM (TEE);  Surgeon: Rexene Alberts, MD;  Location: [place];  Service: Open Heart Surgery;  Laterality: N/A;  . TONSILLECTOMY    . TRAM Right 11/18/2015   Procedure: VRAM Vertical Rectus Abdominus Muscle Flap;  Surgeon: Loel Lofty Dillingham, DO;  Location: [place];  Service: Plastics;  Laterality: Right;  RIght Back  . TRICUSPID VALVE REPLACEMENT N/A 04/25/2015   Procedure: TRICUSPID VALVE REPAIR;  Surgeon: Rexene Alberts, MD;  Location: [place];  Service: Open Heart Surgery;  Laterality: N/A;  . VAGINAL HYSTERECTOMY      Current Outpatient Medications  Medication Sig Dispense Refill  . acetaminophen (TYLENOL) 500 MG tablet Take 1,000 mg by mouth every 6 (six) hours as needed (for pain.).    Marland Kitchen AMBULATORY NON FORMULARY MEDICATION Inogen at Home Oxygen machine with tubing. DX: COPD DX Code: J44.9 1 each 0  . amiodarone (PACERONE) 200 MG tablet TAKE 1/2 TABLET BY MOUTH EVERY DAY 45 tablet 3  . aspirin EC 81 MG EC tablet Take 1 tablet (81 mg total) by mouth daily.    . bisoprolol (ZEBETA) 5 MG tablet TAKE 1 TABLET BY MOUTH EVERY DAY 90 tablet 2  . diphenhydrAMINE (BENADRYL) 25 mg capsule Take 25 mg by mouth every 6 (six) hours as needed for allergies.    Marland Kitchen docusate sodium (COLACE) 100 MG capsule Take 100 mg by mouth 2 (two) times daily as needed (constipation.).    Marland Kitchen furosemide (LASIX) 20 MG tablet Take 20-40 mg by mouth See admin instructions. Take 2 tablets (40 mg) by mouth in the morning & take 1 tablet (20 mg) by mouth in the afternoon.    .  gabapentin (NEURONTIN) 100 MG capsule Take 200 mg by mouth at bedtime.   1  . ipratropium-albuterol (DUONEB) 0.5-2.5 (3) MG/3ML SOLN Take 3 mLs by nebulization every 6 (six) hours as needed. DX:J44.9 (Patient taking differently: Take 3 mLs by nebulization every 6 (six) hours as needed (with cold symptoms.). DX:J44.9) 1080 mL 1  . levothyroxine (SYNTHROID, LEVOTHROID) 88 MCG tablet Take 88 mcg by mouth daily before breakfast.    . Multiple Vitamins-Minerals (PRESERVISION AREDS 2 PO) Take 1 tablet by mouth 2 (two) times daily.     . pantoprazole (PROTONIX) 40 MG tablet Take 40 mg by mouth 2 (two) times daily.     . rosuvastatin (CRESTOR) 10 MG tablet Take 1 tablet (10 mg total) by mouth daily. 90 tablet 3  . sacubitril-valsartan (ENTRESTO) 49-51 MG Take 1 tablet (49/51 mg) by mouth twice daily 60 tablet 6  . spironolactone (ALDACTONE) 25 MG tablet Take 1 tablet (25 mg total) by mouth daily. 90 tablet 1  . warfarin (COUMADIN) 2.5 MG tablet TAKE AS DIRECTED BY COUMADIN CLINIC 140 tablet 0   No current facility-administered medications for this visit.    Allergies  Allergen Reactions  . Ace Inhibitors Cough and Other (See Comments)  . Amoxicillin-Pot Clavulanate Swelling and Other (See Comments)    SWOLLEN JOINTS  Has patient had a PCN reaction causing immediate rash, facial/tongue/throat swelling, SOB or lightheadedness with hypotension: Yes Has patient had a PCN reaction causing severe rash involving mucus membranes or skin necrosis:  No Has patient had a PCN reaction that required hospitalization No Has patient had a PCN reaction occurring within the last 10 years: No If all of the above answers are "NO", then may proceed with Cephalosporin  . Penicillins Swelling and Other (See Comments)    SWOLLEN JOINTS  Has patient had a PCN reaction causing immediate rash, facial/tongue/throat swelling, SOB or lightheadedness with hypotension: Yes Has patient had a PCN reaction causing severe rash  involving mucus membranes or skin necrosis: No Has patient had a PCN reaction that required hospitalization No Has patient had a PCN reaction occurring within the last 10 years: No If all of the above answers are "NO", then may proceed with Cephalosporin use.  Swollen joints  . Nifedipine Other (See Comments)    UNSPECIFIED UNSPECIFIED   . Propranolol Other (See Comments)    UNSPECIFIED UNSPECIFIED   . Diazepam Other (See Comments)    Made her "hyper"   . Meperidine Other (See Comments)    Made her "hyper"    . Propoxyphene Other (See Comments)    Made her "hyper"        Review of Systems negative except from HPI and PMH  Physical Exam BP (!) 130/58 (BP Location: Left Arm, Patient Position: Sitting, Cuff Size: Normal)   Pulse 65   Ht 5\' 1"  (1.549 m)   Wt 127 lb (57.6 kg)   SpO2 98%   BMI 24.00 kg/m   Well developed and well nourished in no acute distress HENT normal Neck supple with JVP-flat Clear Device pocket well healed; without hematoma or erythema.  There is no tethering  Regular rate and rhythm, no  gallop  2/6 murmur Abd-soft with active BS No Clubbing cyanosis  edema Skin-warm and dry A & Oriented  Grossly normal sensory and motor function  ECG atrial pacing at 65 Intervals 39/12/44 Right bundle branch block   Assessment and  Plan  Atrial fibrillation-persistent status post maze operation  High Risk Medication Surveillance Amiodarone   Complete heart block -Intermittent  Sinus node dysfunction   First-degree AV block profound right bundle branch block  Status post mitral valve replacement-bioprosthetic with tricuspid valve repair  Pacemaker-Medtronic  Dypsnea on Exertion   Cardiomyopathy   Anemia-resolved  She is much improved.  We will continue her on her current medications.  She is euvolemic. Anemia is resolved.  Will discuss with Dr. Audelia Acton whether she needs aspirin in addition to her Coumadin.  I do not see the  indication.  Patient sinus rhythm   No clinical bleeding.

## 2020-03-21 NOTE — Telephone Encounter (Signed)
Dr Caryl Comes confirmed with Dr Fletcher Anon that patient can stop her aspirin.  Attempted to reach patient and no answer. Left message to call back.

## 2020-03-21 NOTE — Patient Instructions (Signed)
Medication Instructions:  Your physician recommends that you continue on your current medications as directed. Please refer to the Current Medication list given to you today.  *If you need a refill on your cardiac medications before your next appointment, please call your pharmacy*  Follow-Up: At [facility], you and your health needs are our priority.  As part of our continuing mission to provide you with exceptional heart care, we have created designated Provider Care Teams.  These Care Teams include your primary Cardiologist (physician) and Advanced Practice Providers (APPs -  Physician Assistants and Nurse Practitioners) who all work together to provide you with the care you need, when you need it.  We recommend signing up for the patient portal called "MyChart".  Sign up information is provided on this After Visit Summary.  MyChart is used to connect with patients for Virtual Visits (Telemedicine).  Patients are able to view lab/test results, encounter notes, upcoming appointments, etc.  Non-urgent messages can be sent to your provider as well.   To learn more about what you can do with MyChart, go to [email or web address].    Your next appointment:   6 month(s)  The format for your next appointment:   In Person  Provider:    You may see DR Caryl Comes.

## 2020-03-21 NOTE — Telephone Encounter (Signed)
Patient calling back. She verbalized understanding that it is ok to stop the aspirin. Med list updated.

## 2020-03-24 LAB — CUP PACEART INCLINIC DEVICE CHECK
Battery Remaining Longevity: 60 mo
Battery Voltage: 2.99 V
Brady Statistic AP VP Percent: 0.21 %
Brady Statistic AP VS Percent: 98.62 %
Brady Statistic AS VP Percent: 0.27 %
Brady Statistic AS VS Percent: 0.9 %
Brady Statistic RA Percent Paced: 98.38 %
Brady Statistic RV Percent Paced: 0.48 %
Date Time Interrogation Session: 20210713083800
Implantable Lead Implant Date: 20160823
Implantable Lead Implant Date: 20160823
Implantable Lead Location: 753859
Implantable Lead Location: 753860
Implantable Lead Model: 5076
Implantable Lead Model: 5076
Implantable Pulse Generator Implant Date: 20160823
Lead Channel Impedance Value: 361 Ohm
Lead Channel Impedance Value: 380 Ohm
Lead Channel Impedance Value: 437 Ohm
Lead Channel Impedance Value: 475 Ohm
Lead Channel Pacing Threshold Amplitude: 0.5 V
Lead Channel Pacing Threshold Amplitude: 0.75 V
Lead Channel Pacing Threshold Pulse Width: 0.4 ms
Lead Channel Pacing Threshold Pulse Width: 0.4 ms
Lead Channel Sensing Intrinsic Amplitude: 1.5 mV
Lead Channel Sensing Intrinsic Amplitude: 9.625 mV
Lead Channel Setting Pacing Amplitude: 2 V
Lead Channel Setting Pacing Amplitude: 2.5 V
Lead Channel Setting Pacing Pulse Width: 0.4 ms
Lead Channel Setting Sensing Sensitivity: 2.8 mV

## 2020-04-05 ENCOUNTER — Other Ambulatory Visit: Payer: Self-pay

## 2020-04-05 ENCOUNTER — Ambulatory Visit (INDEPENDENT_AMBULATORY_CARE_PROVIDER_SITE_OTHER): Payer: Medicare Other

## 2020-04-05 DIAGNOSIS — Z5181 Encounter for therapeutic drug level monitoring: Secondary | ICD-10-CM | POA: Diagnosis not present

## 2020-04-05 DIAGNOSIS — Z953 Presence of xenogenic heart valve: Secondary | ICD-10-CM | POA: Diagnosis not present

## 2020-04-05 DIAGNOSIS — I340 Nonrheumatic mitral (valve) insufficiency: Secondary | ICD-10-CM | POA: Diagnosis not present

## 2020-04-05 LAB — POCT INR: INR: 2.6 (ref 2.0–3.0)

## 2020-04-05 NOTE — Patient Instructions (Signed)
-   continue dosage of warfarin 1.5 tablets every day EXCEPT 1 tablets on MONDAYS, WEDNESDAYS & FRIDAYS - Recheck INR in 6 weeks. 

## 2020-04-27 ENCOUNTER — Telehealth: Payer: Self-pay | Admitting: Cardiovascular Disease

## 2020-04-27 MED ORDER — SIMVASTATIN 10 MG PO TABS
10.0000 mg | ORAL_TABLET | Freq: Every day | ORAL | 3 refills | Status: DC
Start: 2020-04-27 — End: 2021-05-10

## 2020-04-27 NOTE — Telephone Encounter (Signed)
Patient made aware of Dr. Tyrell Antonio response and recommendation. Patient would prefer to resume Simvastatin 10 mg daily. An Rx has been sent to the patients pharmacy.  Patient verbalized understanding and voiced appreciation for the assistance.

## 2020-04-27 NOTE — Telephone Encounter (Signed)
Patient states she is having muscle aches after being on Crestor. Please call to discuss.

## 2020-04-27 NOTE — Telephone Encounter (Signed)
I switched her to rosuvastatin not just because of interaction with amiodarone but also because her LDL was not as low as it should be.  If she is not able to tolerate rosuvastatin, she can go back on simvastatin 10 mg daily.

## 2020-04-27 NOTE — Telephone Encounter (Signed)
Spoke with the patient. Patient sts that she has developed muscle aches and pain since she was switched from Simvastatin to Rosuvastatin 10 mg qs in July 2021 by Dr. Fletcher Anon.  Patient sts that Dr. Fletcher Anon switched her statin medication because of a possible interaction with Amiodarone. She tolerated the Simvastatin well.  Adv the patient to HOLD Rosuvastatin for now. I will fwd the update to Dr. Fletcher Anon and call back with his response and recommendation.  Patient agreeable with the plan and voiced appreciation for the call back.

## 2020-05-17 ENCOUNTER — Ambulatory Visit (INDEPENDENT_AMBULATORY_CARE_PROVIDER_SITE_OTHER): Payer: Medicare Other

## 2020-05-17 ENCOUNTER — Other Ambulatory Visit: Payer: Self-pay

## 2020-05-17 DIAGNOSIS — I340 Nonrheumatic mitral (valve) insufficiency: Secondary | ICD-10-CM

## 2020-05-17 DIAGNOSIS — Z953 Presence of xenogenic heart valve: Secondary | ICD-10-CM | POA: Diagnosis not present

## 2020-05-17 DIAGNOSIS — Z5181 Encounter for therapeutic drug level monitoring: Secondary | ICD-10-CM | POA: Diagnosis not present

## 2020-05-17 LAB — POCT INR: INR: 2.9 (ref 2.0–3.0)

## 2020-05-17 NOTE — Patient Instructions (Signed)
-   continue dosage of warfarin 1.5 tablets every day EXCEPT 1 tablets on MONDAYS, WEDNESDAYS & FRIDAYS - Recheck INR in 6 weeks. 

## 2020-06-08 ENCOUNTER — Other Ambulatory Visit: Payer: Self-pay

## 2020-06-08 ENCOUNTER — Ambulatory Visit (INDEPENDENT_AMBULATORY_CARE_PROVIDER_SITE_OTHER): Payer: Medicare Other

## 2020-06-08 ENCOUNTER — Encounter: Payer: Self-pay | Admitting: Internal Medicine

## 2020-06-08 ENCOUNTER — Ambulatory Visit (INDEPENDENT_AMBULATORY_CARE_PROVIDER_SITE_OTHER): Payer: Medicare Other | Admitting: Internal Medicine

## 2020-06-08 VITALS — BP 112/50 | HR 65 | Temp 97.5°F | Ht 61.0 in | Wt 127.0 lb

## 2020-06-08 DIAGNOSIS — J449 Chronic obstructive pulmonary disease, unspecified: Secondary | ICD-10-CM | POA: Diagnosis not present

## 2020-06-08 DIAGNOSIS — I442 Atrioventricular block, complete: Secondary | ICD-10-CM

## 2020-06-08 DIAGNOSIS — J9611 Chronic respiratory failure with hypoxia: Secondary | ICD-10-CM

## 2020-06-08 DIAGNOSIS — I2510 Atherosclerotic heart disease of native coronary artery without angina pectoris: Secondary | ICD-10-CM | POA: Diagnosis not present

## 2020-06-08 DIAGNOSIS — R0689 Other abnormalities of breathing: Secondary | ICD-10-CM | POA: Diagnosis not present

## 2020-06-08 DIAGNOSIS — I495 Sick sinus syndrome: Secondary | ICD-10-CM

## 2020-06-08 NOTE — Patient Instructions (Signed)
Continue nebulizers as prescribed Continue oxygen as prescribed Follow-up with cardiology as scheduled Continue amiodarone as prescribed No indication for CT at this time 

## 2020-06-08 NOTE — Progress Notes (Signed)
PULMONARY OFFICE FOLLOW UP NOTE  Date of initial consultation: 07/27/15 Reason for consultation: COPD  PT PROFILE:  86 y.o. F former smoker (up to 1.5 PPD, quit 1999) initially seen in 2016 for COPD. Previously followed by Dr Raul Del. She was first diagnosed with COPD in 2012 at the time of a pneumonia.   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 PFTs 03/17/15: Spirometry reveals mild/moderate obstruction (FEV1 67% predicted, FEV1/FVC 62%).  Lung volumes normal.  Diffusion capacity markedly reduced at 35% predicted.  DLCO/VA 54% predicted Echocardiogram 08/22/15: LVEF 30-35%. Diffuse HK. prosthetic valves functioning well ONO on RA 02/22/16: significant desaturation to low of 78%. Total desaturation events: 170 Echocardiogram 09/17/16: LVEF 50-55%.  LA mildly dilated.  RVSP est normal Echocardiogram 07/17/18: LVEF 20-25%.  Diffuse hypokinesis.  Prosthetic mitral valve functioning normally.  LA mildly dilated.  RV moderately dilated.  RV systolic function moderately reduced.  PA systolic pressure could not be accurately estimated Echocardiogram 08/24/18: LVEF 35-40%.  Mild concentric hypertrophy.  Diffuse hypokinesis.  Bioprosthetic mitral valve functioning normally.  LA mildly dilated.  RV cavity mildly dilated.  RVSP could not be accurately estimated. Echocardiogram 10/26/18: LVEF 35-40%.  LA moderately dilated.  Bioprosthetic mitral valve present.  RV systolic function mildly reduced.  RV cavity moderately enlarged.  RA moderately dilated.  RVSP est 26 mmHg R heart cath 02/08/19: Mean PCWP 23 mmHg. Large V wave c/w MR.  Echocardiogram 04/01/19: LVEF 35-40%.  Moderate LVH.  LA mildly dilated.  Bioprosthetic mitral valve present.  RV mildly dilated with moderately reduced systolic function.  RVSP 35 mmHg. PAP 69/24 mmHg  PROBLEMS: Mild COPD CHF S/P Maze procedure, CABG with  chronic sternal wound infection   CC Follow up COPD   HPI Previous history of COVID-19 infection June 2020 Patient with chronic shortness of breath and dyspnea on exertion  No exacerbation at this time No evidence of heart failure at this time No evidence or signs of infection at this time No respiratory distress No fevers, chills, nausea, vomiting, diarrhea No evidence of lower extremity edema No evidence hemoptysis  A. fib seems to be well-controlled at this time with amiodarone She takes 100 mg daily    Patient uses and benefits from oxygen therapy She needs this for survival    Review of Systems:  Gen:  Denies  fever, sweats, chills weight loss  HEENT: Denies blurred vision, double vision, ear pain, eye pain, hearing loss, nose bleeds, sore throat Cardiac:  No dizziness, chest pain or heaviness, chest tightness,edema, No JVD Resp:   No cough, -sputum production, -shortness of breath,-wheezing, -hemoptysis,  Gi: Denies swallowing difficulty, stomach pain, nausea or vomiting, diarrhea, constipation, bowel incontinence Gu:  Denies bladder incontinence, burning urine Ext:   Denies Joint pain, stiffness or swelling Skin: Denies  skin rash, easy bruising or bleeding or hives Endoc:  Denies polyuria, polydipsia , polyphagia or weight change Psych:   Denies depression, insomnia or hallucinations  Other:  All other systems negative   BP (!) 112/50 (BP Location: Left Arm, Patient Position: Sitting, Cuff Size: Normal)   Pulse 65   Temp (!) 97.5 F (36.4 C) (Temporal)   Ht 5\' 1"  (1.549 m)   Wt 127 lb (57.6 kg)   SpO2 98%   BMI 24.00 kg/m     Physical Examination:   General Appearance: No distress  Neuro:without focal findings,  speech normal,  HEENT: PERRLA,  EOM intact.   Pulmonary: normal breath sounds, No wheezing.  CardiovascularNormal S1,S2.  No m/r/g.   Abdomen: Benign, Soft, non-tender. Renal:  No costovertebral tenderness  GU:  Not performed at this  time. Endoc: No evident thyromegaly Skin:   warm, no rashes, no ecchymosis  Extremities: normal, no cyanosis, clubbing. PSYCHIATRIC: Mood, affect within normal limits.   ALL OTHER ROS ARE NEGATIVE      BMP Latest Ref Rng & Units 12/01/2019 06/03/2019 03/16/2019  Glucose 70 - 99 mg/dL 152(H) 100(H) 80  BUN 8 - 23 mg/dL 21 20 25   Creatinine 0.44 - 1.00 mg/dL 0.91 0.74 0.97  BUN/Creat Ratio 12 - 28 - 27 26  Sodium 135 - 145 mmol/L 145 145(H) 146(H)  Potassium 3.5 - 5.1 mmol/L 3.6 4.2 4.8  Chloride 98 - 111 mmol/L 101 99 101  CO2 22 - 32 mmol/L 35(H) 27 29  Calcium 8.9 - 10.3 mg/dL 9.4 9.0 8.9    CBC Latest Ref Rng & Units 06/03/2019 03/16/2019 02/26/2019  WBC 3.4 - 10.8 x10E3/uL 6.0 6.6 7.0  Hemoglobin 11.1 - 15.9 g/dL 11.4 7.2(L) 8.0(L)  Hematocrit 34.0 - 46.6 % 35.2 21.0(L) 24.1(L)  Platelets 150 - 450 x10E3/uL 156 207 186   CXR 02/22/19: Status post sternotomy.  AICD in place.  Cardiomegaly.  Patchy bilateral infiltrates.   85 year old white female with a history of COPD with history of CAD with AICD with previous infection of COVID-19 pneumonia approximately 1 year ago with chronic hypoxic respiratory  failure   COPD Chronic shortness of breath and dyspnea on exertion Continue nebulized bronchodilator therapy No indication for prednisone or antibiotics at this time No exacerbation at this time Pulmonary function testing would not be feasible in the setting of chronic respiratory insufficiency  Chronic hypoxic respiratory failure Continue oxygen as prescribed Patient uses and benefits from oxygen therapy Patient needs this for survival  Amiodarone therapy for A. 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  The importance of social distancing was discussed today. Hand Washing Techniques and avoid touching face was advised.     MEDICATION ADJUSTMENTS/LABS AND TESTS ORDERED: Continue nebulizers as prescribed Continue oxygen as prescribed Follow-up with cardiology as scheduled Continue amiodarone as prescribed No indication for CT at this time   [place]   Patient satisfied with Plan of action and management. All questions answered  Follow up in 6 months   TOTAL TIME SPENT 24 mins  Maretta Bees Patricia Pesa, M.D.  [facility]  Medical Director [place] Director [facility] Cardio-Pulmonary Department

## 2020-06-10 LAB — CUP PACEART REMOTE DEVICE CHECK
Battery Remaining Longevity: 52 mo
Battery Voltage: 2.99 V
Brady Statistic AP VP Percent: 0.16 %
Brady Statistic AP VS Percent: 99.44 %
Brady Statistic AS VP Percent: 0.11 %
Brady Statistic AS VS Percent: 0.29 %
Brady Statistic RA Percent Paced: 99.42 %
Brady Statistic RV Percent Paced: 0.28 %
Date Time Interrogation Session: 20210930091043
Implantable Lead Implant Date: 20160823
Implantable Lead Implant Date: 20160823
Implantable Lead Location: 753859
Implantable Lead Location: 753860
Implantable Lead Model: 5076
Implantable Lead Model: 5076
Implantable Pulse Generator Implant Date: 20160823
Lead Channel Impedance Value: 361 Ohm
Lead Channel Impedance Value: 399 Ohm
Lead Channel Impedance Value: 437 Ohm
Lead Channel Impedance Value: 456 Ohm
Lead Channel Pacing Threshold Amplitude: 0.5 V
Lead Channel Pacing Threshold Amplitude: 0.625 V
Lead Channel Pacing Threshold Pulse Width: 0.4 ms
Lead Channel Pacing Threshold Pulse Width: 0.4 ms
Lead Channel Sensing Intrinsic Amplitude: 11.5 mV
Lead Channel Sensing Intrinsic Amplitude: 11.5 mV
Lead Channel Sensing Intrinsic Amplitude: 2.125 mV
Lead Channel Sensing Intrinsic Amplitude: 2.125 mV
Lead Channel Setting Pacing Amplitude: 2 V
Lead Channel Setting Pacing Amplitude: 2.5 V
Lead Channel Setting Pacing Pulse Width: 0.4 ms
Lead Channel Setting Sensing Sensitivity: 2.8 mV

## 2020-06-12 NOTE — Progress Notes (Signed)
Remote pacemaker transmission.   

## 2020-06-22 ENCOUNTER — Other Ambulatory Visit: Payer: Self-pay

## 2020-06-22 MED ORDER — ENTRESTO 49-51 MG PO TABS: ORAL_TABLET | ORAL | 0 refills | Status: DC

## 2020-06-28 ENCOUNTER — Ambulatory Visit (INDEPENDENT_AMBULATORY_CARE_PROVIDER_SITE_OTHER): Payer: Medicare Other

## 2020-06-28 ENCOUNTER — Other Ambulatory Visit: Payer: Self-pay

## 2020-06-28 DIAGNOSIS — Z5181 Encounter for therapeutic drug level monitoring: Secondary | ICD-10-CM | POA: Diagnosis not present

## 2020-06-28 DIAGNOSIS — Z953 Presence of xenogenic heart valve: Secondary | ICD-10-CM | POA: Diagnosis not present

## 2020-06-28 DIAGNOSIS — I340 Nonrheumatic mitral (valve) insufficiency: Secondary | ICD-10-CM

## 2020-06-28 LAB — POCT INR: INR: 2.9 (ref 2.0–3.0)

## 2020-06-28 NOTE — Patient Instructions (Signed)
-   continue dosage of warfarin 1.5 tablets every day EXCEPT 1 tablets on MONDAYS, WEDNESDAYS & FRIDAYS - Recheck INR in 6 weeks. 

## 2020-07-10 ENCOUNTER — Other Ambulatory Visit: Payer: Self-pay | Admitting: Internal Medicine

## 2020-07-10 DIAGNOSIS — Z1231 Encounter for screening mammogram for malignant neoplasm of breast: Secondary | ICD-10-CM

## 2020-07-18 ENCOUNTER — Other Ambulatory Visit: Payer: Self-pay | Admitting: Internal Medicine

## 2020-07-20 ENCOUNTER — Encounter: Payer: Self-pay | Admitting: Cardiovascular Disease

## 2020-07-20 ENCOUNTER — Ambulatory Visit (INDEPENDENT_AMBULATORY_CARE_PROVIDER_SITE_OTHER): Payer: Medicare Other | Admitting: Cardiovascular Disease

## 2020-07-20 ENCOUNTER — Other Ambulatory Visit: Payer: Self-pay

## 2020-07-20 VITALS — BP 132/62 | HR 70 | Ht 62.0 in | Wt 127.1 lb

## 2020-07-20 DIAGNOSIS — Z952 Presence of prosthetic heart valve: Secondary | ICD-10-CM

## 2020-07-20 DIAGNOSIS — Z79899 Other long term (current) drug therapy: Secondary | ICD-10-CM

## 2020-07-20 DIAGNOSIS — I5022 Chronic systolic (congestive) heart failure: Secondary | ICD-10-CM

## 2020-07-20 DIAGNOSIS — I4819 Other persistent atrial fibrillation: Secondary | ICD-10-CM

## 2020-07-20 DIAGNOSIS — Z950 Presence of cardiac pacemaker: Secondary | ICD-10-CM

## 2020-07-20 DIAGNOSIS — I2510 Atherosclerotic heart disease of native coronary artery without angina pectoris: Secondary | ICD-10-CM | POA: Diagnosis not present

## 2020-07-20 DIAGNOSIS — E785 Hyperlipidemia, unspecified: Secondary | ICD-10-CM

## 2020-07-20 NOTE — Patient Instructions (Signed)
Medication Instructions:  Your physician recommends that you continue on your current medications as directed. Please refer to the Current Medication list given to you today.  *If you need a refill on your cardiac medications before your next appointment, please call your pharmacy*   Lab Work: Cmp and Tsh today  If you have labs (blood work) drawn today and your tests are completely normal, you will receive your results only by: Marland Kitchen MyChart Message (if you have MyChart) OR . A paper copy in the mail If you have any lab test that is abnormal or we need to change your treatment, we will call you to review the results.   Testing/Procedures: None ordered   Follow-Up: At [facility], you and your health needs are our priority.  As part of our continuing mission to provide you with exceptional heart care, we have created designated Provider Care Teams.  These Care Teams include your primary Cardiologist (physician) and Advanced Practice Providers (APPs -  Physician Assistants and Nurse Practitioners) who all work together to provide you with the care you need, when you need it.  We recommend signing up for the patient portal called "MyChart".  Sign up information is provided on this After Visit Summary.  MyChart is used to connect with patients for Virtual Visits (Telemedicine).  Patients are able to view lab/test results, encounter notes, upcoming appointments, etc.  Non-urgent messages can be sent to your provider as well.   To learn more about what you can do with MyChart, go to [email or web address].    Your next appointment:   6 month(s)  The format for your next appointment:   In Person  Provider:   You may see Kathlyn Sacramento, MD or one of the following Advanced Practice Providers on your designated Care Team:    Murray Hodgkins, NP  Christell Faith, PA-C  Marrianne Mood, PA-C  Cadence Kathlen Mody, [place]    Other Instructions N/A

## 2020-07-20 NOTE — Progress Notes (Signed)
Cardiology Office Note   Date:  07/20/2020   ID:  [name], DOB 02-10-1935, MRN [id number]  PCP:  Ezequiel Kayser, MD  Cardiologist:   Kathlyn Sacramento, MD   Chief Complaint  Patient presents with  . other    4 month f/u no complaints today. Meds reviewed verbally with pt.      History of Present Illness: [name] is a 85 y.o. female who presents for a follow-up visit regarding chronic systolic heart failure, atrial fibrillation and mitral regurgitation.  She has known history of COPD, breast cancer status post right partial mastectomy, hypothyroidism, hyperlipidemia and Mnire disease.   She underwent mitral valve replacement with a bioprosthetic valve, tricuspid valve repair, one-vessel CABG with LIMA to LAD and maze procedure in August 2016. This was complicated by sternal wound infection which required debridement. She also had complete heart block and underwent permanent pacemaker placement.  In 08/2017, she had worsening heart failure.  Echocardiogram showed an EF of 30 to 35%.She was seen by EP and it was felt that LV dysfunction was in the context of RV apical pacing. That was inactivated and she noted immediate improvement in symptoms. She again had worsening heart failure symptoms in late 2019.  She had a drop in EF to 20 to 25% in the setting of atrial fibrillation with RVR.  She was rate controlled and started on amiodarone and subsequently underwent cardioversion with restoration of sinus rhythm.  EF improved to 35 to 40% after that.   Right heart catheterization was done in June 2020 which showed moderately elevated filling pressures, severe pulmonary hypertension and mildly reduced cardiac output.  There was no evidence of left-to-right intracardiac shunting by saturation run.  Prominent V waves were noted on pulmonary capillary wedge pressure tracing suggestive of significant mitral regurgitation.  Symptoms improved after increasing furosemide.    She was  hospitalized with COVID-19 infection in June but had mild disease overall.   She underwent attempted upgrade of her pacemaker to a biventricular device but the procedure was not successful due to inability to place an LV lead.  During last visit, I doubled the dose of spironolactone.  She has been doing well overall with no chest pain.  She reports stable exertional dyspnea and no significant leg edema.  No side effects with anticoagulation.  Given that her LDL was above 70, I switch her from simvastatin to rosuvastatin but she did not tolerate the medication due to myalgia.   Past Medical History:  Diagnosis Date  . Anxiety   . Arthritis    "hands" (11/10/2015)  . Breast cancer ([place]) 2002   right  . CAD s/p CABG x 1    a. 04/2015 LIMA to LAD  . Chronic combined systolic (congestive) and diastolic (congestive) heart failure ([place])    a. 01/2015 Echo: EF 50-55%, Gr2 DD (preop valve surgery); b. 08/2015 Echo: EF 30-35%, diff HK. Gr2 DD; c. 09/2016 Echo: EF 50-55%; d. 07/2018 Echo: EF 20-25%; e. 03/2019 Echo: EF 35-40%, diff HK. Mod red RV fxn, RVSP 34.30mmHg. Mildly dil LA. Nl MV prosthesis. Triv AI.  Marland Kitchen COPD (chronic obstructive pulmonary disease) ([place])   . Coronary artery disease   . GERD (gastroesophageal reflux disease)   . History of colon polyps   . History of mitral valve replacement    a. 04/2015 s/p 27 mm [facility] Mitral bovine bioprosthetic tissue valve  . Hyperlipidemia   . Hypertension   . Hypothyroidism   .  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 04/2015 Complete bilateral atrial lesion set using cryothermy and bipolar radiofrequency ablation with clipping of LA appendage  . Meniere disease   . Meniere's disease   . On home oxygen therapy    "2L at night" (11/10/2015)  . PAF (paroxysmal atrial fibrillation) ([place])   . Personal history of radiation therapy   . Pneumonia ~ 2010  . PONV (postoperative nausea and vomiting)    Pt felt like eardrums were gonna pop  .  Post-surgical complete heart block, symptomatic    a. 04/2015 s/p MDT [id number] [facility]visa DR MRI DC PPM.  . Presence of permanent cardiac pacemaker   . Pulmonary hypertension ([place])   . Surgical wound, non healing - chest wall 11/10/2015  . Tricuspid Regurgitation s/p Repair    a. 04/2015 s/p 26 mm Edwards mc3 ring annuloplasty  . Wound infection 10/13/2015   Superficial sternal wound infection    Past Surgical History:  Procedure Laterality Date  . APPENDECTOMY    . APPLICATION OF A-CELL OF CHEST/ABDOMEN N/A 11/21/2015   Procedure: APPLICATION OF A-CELL OF CHEST/ABDOMEN;  Surgeon: Loel Lofty Dillingham, DO;  Location: [place];  Service: Plastics;  Laterality: N/A;  . APPLICATION OF A-CELL OF CHEST/ABDOMEN Right 12/21/2015   Procedure: APPLICATION OF A-CELL OF RIGHT CHEST;  Surgeon: Loel Lofty Dillingham, DO;  Location: [place];  Service: Plastics;  Laterality: Right;  . APPLICATION OF A-CELL OF CHEST/ABDOMEN Right 01/11/2016   Procedure: APPLICATION OF A-CELL OF RIGHT CHEST;  Surgeon: Loel Lofty Dillingham, DO;  Location: [place];  Service: Plastics;  Laterality: Right;  . APPLICATION OF A-CELL OF CHEST/ABDOMEN Right 02/12/2016   Procedure: APPLICATION OF A-CELL TO RIGHT CHEST WOUND;  Surgeon: Loel Lofty Dillingham, DO;  Location: [place];  Service: Plastics;  Laterality: Right;  . APPLICATION OF WOUND VAC N/A 05/09/2015   Procedure: APPLICATION OF WOUND VAC;  Surgeon: Rexene Alberts, MD;  Location: [place];  Service: Thoracic;  Laterality: N/A;  . APPLICATION OF WOUND VAC N/A 11/13/2015   Procedure: APPLICATION OF WOUND VAC;  Surgeon: Rexene Alberts, MD;  Location: [place];  Service: Thoracic;  Laterality: N/A;  . APPLICATION OF WOUND VAC N/A 11/21/2015   Procedure: APPLICATION OF WOUND VAC;  Surgeon: Loel Lofty Dillingham, DO;  Location: [place];  Service: Plastics;  Laterality: N/A;  . APPLICATION OF WOUND VAC Right 12/21/2015   Procedure: APPLICATION OF WOUND VAC to right chest wall ;  Surgeon: Loel Lofty Dillingham, DO;   Location: [place];  Service: Plastics;  Laterality: Right;  . APPLICATION OF WOUND VAC Right 01/11/2016   Procedure: APPLICATION OF WOUND VAC RIGHT CHEST ;  Surgeon: Loel Lofty Dillingham, DO;  Location: [place];  Service: Plastics;  Laterality: Right;  . APPLICATION OF WOUND VAC Right 01/24/2016   Procedure: APPLICATION OF WOUND VAC RIGHT CHEST WALL;  Surgeon: Loel Lofty Dillingham, DO;  Location: [place];  Service: Plastics;  Laterality: Right;  . APPLICATION OF WOUND VAC Right 02/12/2016   Procedure: RE-APPLICATION OF WOUND VAC TO RIGHT CHEST WOUND;  Surgeon: Loel Lofty Dillingham, DO;  Location: [place];  Service: Plastics;  Laterality: Right;  . BIV UPGRADE N/A 06/07/2019   Procedure: BIV UPGRADE;  Surgeon: Deboraha Sprang, MD;  Location: [place] [facility];  Service: Cardiovascular;  Laterality: N/A;  . BREAST BIOPSY Left 11/25/06   neg  . BREAST BIOPSY Left 01/20/12   /clip-neg  . CARDIAC CATHETERIZATION  11/2013  [facility]  10/2014   [facility]  . CARDIAC VALVE REPLACEMENT    . CARDIOVERSION N/A 07/20/2018   Procedure: CARDIOVERSION;  Surgeon: Wellington Hampshire, MD;  Location: [facility];  Service: Cardiovascular;  Laterality: N/A;  . CATARACT EXTRACTION W/ INTRAOCULAR LENS  IMPLANT, BILATERAL Bilateral 2014  . CLIPPING OF ATRIAL APPENDAGE N/A 04/25/2015   Procedure: CLIPPING OF ATRIAL APPENDAGE;  Surgeon: Rexene Alberts, MD;  Location: [place];  Service: Open Heart Surgery;  Laterality: N/A;  . COCHLEAR IMPLANT Left 2005?  . COLONOSCOPY WITH PROPOFOL N/A 01/20/2018   Procedure: COLONOSCOPY WITH PROPOFOL;  Surgeon: Toledo, Benay Pike, MD;  Location: [facility];  Service: Gastroenterology;  Laterality: N/A;  . CORONARY ANGIOPLASTY    . CORONARY ARTERY BYPASS GRAFT N/A 04/25/2015   Procedure: CORONARY ARTERY BYPASS GRAFTING (CABG) x ONE, using left internal mammary artery;  Surgeon: Rexene Alberts, MD;  Location: [place];  Service: Open Heart Surgery;  Laterality: N/A;  . DILATION AND  CURETTAGE OF UTERUS  "several before hysterectomy"  . EP IMPLANTABLE DEVICE N/A 05/02/2015   Procedure: Pacemaker Implant;  Surgeon: Thompson Grayer, MD;  Location: [place] [facility];  Service: Cardiovascular;  Laterality: N/A;  . ESOPHAGOGASTRODUODENOSCOPY (EGD) WITH PROPOFOL N/A 01/20/2018   Procedure: ESOPHAGOGASTRODUODENOSCOPY (EGD) WITH PROPOFOL;  Surgeon: Toledo, Benay Pike, MD;  Location: [facility];  Service: Gastroenterology;  Laterality: N/A;  . EYE SURGERY    . I & D EXTREMITY Right 12/06/2015   Procedure: IRRIGATION AND DEBRIDEMENT RIGHT CHEST WALL WITH ACELL PLACEMENT AND VAC;  Surgeon: Loel Lofty Dillingham, DO;  Location: [place];  Service: Plastics;  Laterality: Right;  . INCISION AND DRAINAGE OF WOUND N/A 11/21/2015   Procedure: IRRIGATION AND DEBRIDEMENT WOUND;  Surgeon: Loel Lofty Dillingham, DO;  Location: [place];  Service: Plastics;  Laterality: N/A;  . INCISION AND DRAINAGE OF WOUND Right 12/21/2015   Procedure: IRRIGATION AND DEBRIDEMENT right chest wall WOUND;  Surgeon: Loel Lofty Dillingham, DO;  Location: [place];  Service: Plastics;  Laterality: Right;  right chest wall   . INCISION AND DRAINAGE OF WOUND Right 01/24/2016   Procedure: IRRIGATION AND DEBRIDEMENT RIGHT CHEST WALL WOUND;  Surgeon: Loel Lofty Dillingham, DO;  Location: [place];  Service: Plastics;  Laterality: Right;  . INCISION AND DRAINAGE OF WOUND Right 03/28/2016   Procedure: IRRIGATION AND DEBRIDEMENT RIGHT CHEST WALL WOUND WITH A Cell Placement;  Surgeon: Loel Lofty Dillingham, DO;  Location: [place];  Service: Plastics;  Laterality: Right;  . INCISION AND DRAINAGE OF WOUND Right 05/17/2016   Procedure: IRRIGATION AND DEBRIDEMENT OF RIGHT CHEST WOUND WITH A CELL PLACEMENT;  Surgeon: Loel Lofty Dillingham, DO;  Location: [place];  Service: Plastics;  Laterality: Right;  . INSERT / REPLACE / REMOVE PACEMAKER    . IRRIGATION AND DEBRIDEMENT OF WOUND WITH SPLIT THICKNESS SKIN GRAFT Right 01/11/2016   Procedure: IRRIGATION AND DEBRIDEMENT  OF RIGHT CHEST WOUND ;  Surgeon: Loel Lofty Dillingham, DO;  Location: [place];  Service: Plastics;  Laterality: Right;  . MASTECTOMY, PARTIAL Right 2002   positive  . MAZE N/A 04/25/2015   Procedure: MAZE;  Surgeon: Rexene Alberts, MD;  Location: [place];  Service: Open Heart Surgery;  Laterality: N/A;  . MITRAL VALVE REPAIR N/A 04/25/2015   Procedure: MITRAL VALVE  REPLACEMENT using a 27 mm Edwards Perimount Magna Mitral Ease Valve;  Surgeon: Rexene Alberts, MD;  Location: [place];  Service: Open Heart Surgery;  Laterality: N/A;  .  RIGHT HEART CATH N/A 02/08/2019   Procedure: RIGHT HEART CATH;  Surgeon: Wellington Hampshire, MD;  Location: [place] [facility];  Service: Cardiovascular;  Laterality: N/A;  . SKIN SPLIT GRAFT Right 02/12/2016   Procedure: IRRIGATION AND DEBRIDEMENT RIGHT CHEST WOUND;  Surgeon: Loel Lofty Dillingham, DO;  Location: [place];  Service: Plastics;  Laterality: Right;  . STERNAL WIRES REMOVAL N/A 06/05/2015   Procedure: STERNAL WIRES REMOVAL;  Surgeon: Rexene Alberts, MD;  Location: [place];  Service: Thoracic;  Laterality: N/A;  . STERNAL WOUND DEBRIDEMENT N/A 05/09/2015   Procedure: STERNAL WOUND DEBRIDEMENT;  Surgeon: Rexene Alberts, MD;  Location: [place];  Service: Thoracic;  Laterality: N/A;  . STERNAL WOUND DEBRIDEMENT N/A 11/13/2015   Procedure: Excisional drainage of RIGHT Chest wall mass and breast mass ;  Surgeon: Rexene Alberts, MD;  Location: [place];  Service: Thoracic;  Laterality: N/A;  . TEE WITHOUT CARDIOVERSION N/A 04/25/2015   Procedure: TRANSESOPHAGEAL ECHOCARDIOGRAM (TEE);  Surgeon: Rexene Alberts, MD;  Location: [place];  Service: Open Heart Surgery;  Laterality: N/A;  . TONSILLECTOMY    . TRAM Right 11/18/2015   Procedure: VRAM Vertical Rectus Abdominus Muscle Flap;  Surgeon: Loel Lofty Dillingham, DO;  Location: [place];  Service: Plastics;  Laterality: Right;  RIght Back  . TRICUSPID VALVE REPLACEMENT N/A 04/25/2015   Procedure: TRICUSPID VALVE REPAIR;  Surgeon: Rexene Alberts, MD;  Location: [place];  Service: Open Heart Surgery;  Laterality: N/A;  . VAGINAL HYSTERECTOMY       Current Outpatient Medications  Medication Sig Dispense Refill  . acetaminophen (TYLENOL) 500 MG tablet Take 1,000 mg by mouth every 6 (six) hours as needed (for pain.).    Marland Kitchen AMBULATORY NON FORMULARY MEDICATION In[facility] at Home Oxygen machine with tubing. DX: COPD DX Code: J44.9 1 each 0  . amiodarone (PACERONE) 200 MG tablet TAKE 1/2 TABLET BY MOUTH EVERY DAY 45 tablet 3  . bisoprolol (ZEBETA) 5 MG tablet TAKE 1 TABLET BY MOUTH EVERY DAY 90 tablet 2  . diphenhydrAMINE (BENADRYL) 25 mg capsule Take 25 mg by mouth every 6 (six) hours as needed for allergies.    Marland Kitchen docusate sodium (COLACE) 100 MG capsule Take 100 mg by mouth 2 (two) times daily as needed (constipation.).    Marland Kitchen furosemide (LASIX) 20 MG tablet Take 20-40 mg by mouth See admin instructions. Take 2 tablets (40 mg) by mouth in the morning & take 1 tablet (20 mg) by mouth in the afternoon.    . gabapentin (NEURONTIN) 100 MG capsule Take 200 mg by mouth at bedtime.   1  . ipratropium-albuterol (DUONEB) 0.5-2.5 (3) MG/3ML SOLN Take 3 mLs by nebulization every 6 (six) hours as needed. DX:J44.9 (Patient taking differently: Take 3 mLs by nebulization every 6 (six) hours as needed (with cold symptoms.). DX:J44.9) 1080 mL 1  . levothyroxine (SYNTHROID, LEVOTHROID) 88 MCG tablet Take 88 mcg by mouth daily before breakfast.    . Multiple Vitamins-Minerals (PRESERVISION AREDS 2 PO) Take 1 tablet by mouth 2 (two) times daily.     . pantoprazole (PROTONIX) 40 MG tablet Take 40 mg by mouth 2 (two) times daily.     . sacubitril-valsartan (ENTRESTO) 49-51 MG Take 1 tablet (49/51 mg) by mouth twice daily 60 tablet 0  . simvastatin (ZOCOR) 10 MG tablet Take 1 tablet (10 mg total) by mouth at bedtime. 90 tablet 3  . spironolactone (ALDACTONE) 25 MG tablet Take 1 tablet (25 mg total)  by mouth daily. 90 tablet 1  . warfarin (COUMADIN) 2.5 MG tablet TAKE  AS DIRECTED BY COUMADIN CLINIC 140 tablet 0   No current facility-administered medications for this visit.    Allergies:   Ace inhibitors, Amoxicillin-pot clavulanate, Penicillins, Nifedipine, Propranolol, Diazepam, Meperidine, and Propoxyphene    Social History:  The patient  reports that she quit smoking about 22 years ago. Her smoking use included cigarettes. She has a 60.00 pack-year smoking history. She has never used smokeless tobacco. She reports current alcohol use. She reports that she does not use drugs.   Family History:  The patient's family history includes Breast cancer in her maternal aunt and paternal aunt; CVA in her mother; Heart attack in her paternal uncle; Heart disease in her brother and father.    ROS:  Please see the history of present illness.   Otherwise, review of systems are positive for none.   All other systems are reviewed and negative.    PHYSICAL EXAM: VS:  BP 132/62 (BP Location: Left Arm, Patient Position: Sitting, Cuff Size: Normal)   Pulse 70   Ht 5\' 2"  (1.575 m)   Wt 127 lb 2 oz (57.7 kg)   SpO2 98%   BMI 23.25 kg/m  , BMI Body mass index is 23.25 kg/m. GEN: Well nourished, well developed, in no acute distress  HEENT: normal  Neck: no JVD, carotid bruits, or masses Cardiac: RRR; no murmurs, rubs, or gallops, trace edema  Respiratory:  clear to auscultation bilaterally, normal work of breathing GI: soft, nontender, nondistended, + BS MS: no deformity or atrophy  Skin: warm and dry, no rash Neuro:  Strength and sensation are intact Psych: euthymic mood, full affect   EKG:  EKG is ordered today. The ekg ordered today demonstrates atrial paced rhythm with left anterior fascicular block.  Recent Labs: 09/14/2019: ALT 13 12/01/2019: BUN 21; Creatinine, Ser 0.91; Potassium 3.6; Sodium 145    Lipid Panel    Component Value Date/Time   TRIG 48 02/22/2019 1100      Wt Readings from Last 3 Encounters:  07/20/20 127 lb 2 oz (57.7 kg)   06/08/20 127 lb (57.6 kg)  03/21/20 127 lb (57.6 kg)       PAD Screen 04/05/2016  Previous PAD dx? No  Previous surgical procedure? No  Pain with walking? No  Feet/toe relief with dangling? No  Painful, non-healing ulcers? No  Extremities discolored? No      ASSESSMENT AND PLAN:   1.  Chronic systolic heart failure: She appears to be euvolemic on current dose of furosemide.  She is on optimal medical therapy including  Entresto, bisoprolol and spironolactone. Check basic metabolic profile today.  2. Status post mitral valve replacement with a bioprosthetic valve: This was intact on most recent echocardiogram.  3. Coronary artery disease status post one-vessel LIMA to LAD: No anginal symptoms  4. Persistent atrial fibrillation: She seems to be maintaining in sinus rhythm with amiodarone.  Continue long-term anticoagulation with warfarin.  Check TSH and liver profile today given long-term use of amiodarone.  5.  Hyperlipidemia: Currently on simvastatin.  She did not tolerate rosuvastatin due to myalgia.  6.  Status post permanent pacemaker placement: Followed by Dr. Caryl Comes.  Unsuccessful upgrade to biventricular pacemaker due to inability to place an LV lead.    Disposition:   FU with me in 6 months  Signed,  Kathlyn Sacramento, MD  07/20/2020 8:43 AM    [place] [facility]

## 2020-07-21 LAB — COMPREHENSIVE METABOLIC PANEL
ALT: 14 IU/L (ref 0–32)
AST: 15 IU/L (ref 0–40)
Albumin/Globulin Ratio: 1.8 (ref 1.2–2.2)
Albumin: 4.2 g/dL (ref 3.6–4.6)
Alkaline Phosphatase: 76 IU/L (ref 44–121)
BUN/Creatinine Ratio: 17 (ref 12–28)
BUN: 14 mg/dL (ref 8–27)
Bilirubin Total: 0.4 mg/dL (ref 0.0–1.2)
CO2: 32 mmol/L — ABNORMAL HIGH (ref 20–29)
Calcium: 9.5 mg/dL (ref 8.7–10.3)
Chloride: 97 mmol/L (ref 96–106)
Creatinine, Ser: 0.82 mg/dL (ref 0.57–1.00)
GFR calc Af Amer: 77 mL/min/{1.73_m2} (ref >59)
GFR calc non Af Amer: 66 mL/min/{1.73_m2} (ref >59)
Globulin, Total: 2.4 g/dL (ref 1.5–4.5)
Glucose: 111 mg/dL — ABNORMAL HIGH (ref 65–99)
Potassium: 4.1 mmol/L (ref 3.5–5.2)
Sodium: 143 mmol/L (ref 134–144)
Total Protein: 6.6 g/dL (ref 6.0–8.5)

## 2020-07-21 LAB — TSH: TSH: 0.824 u[IU]/mL (ref 0.450–4.500)

## 2020-07-22 ENCOUNTER — Other Ambulatory Visit: Payer: Self-pay | Admitting: Cardiovascular Disease

## 2020-07-23 ENCOUNTER — Other Ambulatory Visit: Payer: Self-pay | Admitting: Cardiovascular Disease

## 2020-08-08 ENCOUNTER — Other Ambulatory Visit: Payer: Self-pay | Admitting: Internal Medicine

## 2020-08-08 ENCOUNTER — Other Ambulatory Visit: Payer: Self-pay | Admitting: Cardiovascular Disease

## 2020-08-08 NOTE — Telephone Encounter (Signed)
Please review for refill. Thanks!  

## 2020-08-09 ENCOUNTER — Other Ambulatory Visit: Payer: Self-pay

## 2020-08-09 ENCOUNTER — Ambulatory Visit (INDEPENDENT_AMBULATORY_CARE_PROVIDER_SITE_OTHER): Payer: Medicare Other

## 2020-08-09 DIAGNOSIS — Z5181 Encounter for therapeutic drug level monitoring: Secondary | ICD-10-CM

## 2020-08-09 DIAGNOSIS — Z953 Presence of xenogenic heart valve: Secondary | ICD-10-CM | POA: Diagnosis not present

## 2020-08-09 DIAGNOSIS — I340 Nonrheumatic mitral (valve) insufficiency: Secondary | ICD-10-CM

## 2020-08-09 LAB — POCT INR: INR: 3.8 — AB (ref 2.0–3.0)

## 2020-08-09 NOTE — Patient Instructions (Signed)
-   skip warfarin tonight - take 1/2 tablet warfarin tomorrow, then  - continue dosage of warfarin 1.5 tablets every day EXCEPT 1 tablets on MONDAYS, WEDNESDAYS & FRIDAYS - Recheck INR in 5 weeks.

## 2020-08-11 DIAGNOSIS — N1831 Chronic kidney disease, stage 3a: Secondary | ICD-10-CM | POA: Insufficient documentation

## 2020-08-11 DIAGNOSIS — D6869 Other thrombophilia: Secondary | ICD-10-CM | POA: Insufficient documentation

## 2020-08-11 DIAGNOSIS — N182 Chronic kidney disease, stage 2 (mild): Secondary | ICD-10-CM | POA: Insufficient documentation

## 2020-08-14 ENCOUNTER — Ambulatory Visit
Admission: RE | Admit: 2020-08-14 | Discharge: 2020-08-14 | Disposition: A | Discharge status: Home / Self Care | Payer: Medicare Other | Source: Ambulatory Visit | Attending: Internal Medicine | Admitting: Internal Medicine

## 2020-08-14 ENCOUNTER — Other Ambulatory Visit: Payer: Self-pay

## 2020-08-14 DIAGNOSIS — Z1231 Encounter for screening mammogram for malignant neoplasm of breast: Secondary | ICD-10-CM | POA: Diagnosis not present

## 2020-09-01 ENCOUNTER — Other Ambulatory Visit: Payer: Self-pay | Admitting: Internal Medicine

## 2020-09-07 ENCOUNTER — Telehealth: Payer: Self-pay | Admitting: Emergency Medicine

## 2020-09-07 ENCOUNTER — Ambulatory Visit (INDEPENDENT_AMBULATORY_CARE_PROVIDER_SITE_OTHER): Payer: Medicare Other

## 2020-09-07 DIAGNOSIS — I442 Atrioventricular block, complete: Secondary | ICD-10-CM

## 2020-09-07 LAB — CUP PACEART REMOTE DEVICE CHECK
Battery Remaining Longevity: 54 mo
Battery Voltage: 2.98 V
Brady Statistic AP VP Percent: 0.39 %
Brady Statistic AP VS Percent: 94.98 %
Brady Statistic AS VP Percent: 1.24 %
Brady Statistic AS VS Percent: 3.39 %
Brady Statistic RA Percent Paced: 92.72 %
Brady Statistic RV Percent Paced: 1.58 %
Date Time Interrogation Session: 20211230113554
Implantable Lead Implant Date: 20160823
Implantable Lead Implant Date: 20160823
Implantable Lead Location: 753859
Implantable Lead Location: 753860
Implantable Lead Model: 5076
Implantable Lead Model: 5076
Implantable Pulse Generator Implant Date: 20160823
Lead Channel Impedance Value: 361 Ohm
Lead Channel Impedance Value: 380 Ohm
Lead Channel Impedance Value: 437 Ohm
Lead Channel Impedance Value: 475 Ohm
Lead Channel Pacing Threshold Amplitude: 0.375 V
Lead Channel Pacing Threshold Amplitude: 0.5 V
Lead Channel Pacing Threshold Pulse Width: 0.4 ms
Lead Channel Pacing Threshold Pulse Width: 0.4 ms
Lead Channel Sensing Intrinsic Amplitude: 1.625 mV
Lead Channel Sensing Intrinsic Amplitude: 1.625 mV
Lead Channel Sensing Intrinsic Amplitude: 11.25 mV
Lead Channel Sensing Intrinsic Amplitude: 11.25 mV
Lead Channel Setting Pacing Amplitude: 2 V
Lead Channel Setting Pacing Amplitude: 2.5 V
Lead Channel Setting Pacing Pulse Width: 0.4 ms
Lead Channel Setting Sensing Sensitivity: 2.8 mV

## 2020-09-07 MED ORDER — AMIODARONE HCL 200 MG PO TABS: 200.0000 mg | ORAL_TABLET | Freq: Every day | ORAL | 3 refills | Status: DC

## 2020-09-07 NOTE — Telephone Encounter (Signed)
Alert received for ongoing AT  With controlled v-rates . Patient has had episodes of AT with only short breaks between episodes since 09/02/20.Patient reports that she has had decreased energy, no CP, chest pressure, syncope or dizziness. Episodes and medication lists reviewed with Dr Elberta Fortis. Dr Elberta Fortis ordered patient to increase her amiodorone to 200 mg daily and to follow-up with Dr Graciela Husbands . Patient informed to increase amiodorone 200 mg daily and has follow-up with Dr Graciela Husbands in [facility] 09/26/20. ED precautions given.

## 2020-09-11 NOTE — Telephone Encounter (Signed)
Thanks SK   

## 2020-09-12 ENCOUNTER — Telehealth: Payer: Self-pay | Admitting: Internal Medicine

## 2020-09-12 NOTE — Telephone Encounter (Signed)
I was able to speak with Dr. Graciela Husbands by phone about the patient. Per Dr. Graciela Husbands, it may be best to try to bring her in to the office to try to pace terminate her AT. He will reach out to Dr. Lalla Brothers to see if he would be ok with trying to do this in the office tomorrow.  Dr. Graciela Husbands will let me know if Dr. Lalla Brothers is agreeable.   Awaiting MD response.

## 2020-09-12 NOTE — Telephone Encounter (Signed)
Message received from Dr. Lalla Brothers- ok to add on to his schedule.  I have notified the patient of the plan of care and she is agreeable to coming in tomorrow at 10:40 am to the [place] office to see Dr. Lalla Brothers.  She had a [facility] appt at 3:45 pm tomorrow. I have advised her we will check her INR while she is in clinic tomorrow morning. I will make Mandi, RN (CVRR) aware.

## 2020-09-12 NOTE — Telephone Encounter (Signed)
I spoke with the patient regarding the message below. Per the patient, she has not been feeling well since Christmas Day.  Her HR's have been variable with her highest, that she has noticed, to be 125 bpm. This morning, she reports she checked her HR twice and she was 121 bpm & 125 bpm. Her BP is 120/63.   She states she was contacted by our office last week and was told to increase her amiodarone to 200 mg once daily. I do see that she was contacted by the [facility] on 09/07/20 about ongoing atrial tach with short breaks in this since 09/02/20.  Dr. Elberta Fortis advised the patient increase her amiodarone to 200 mg once daily at that time.  The patient confirms she has been taking her amiodarone 200 mg once daily since Friday. She is taking bisoprolol 5 mg once daily. She has not missed any doses on her warfarin.  She is due for another INR check tomorrow with the coumadin clinic.  INR checks over the last 8 months have been therapeutic.  I advised the patient I will need to reach out to Dr. Graciela Husbands for further recommendations and call her back. The patient voices understanding and is agreeable.

## 2020-09-12 NOTE — Telephone Encounter (Signed)
Patient c/o Palpitations:  High priority if patient c/o lightheadedness, shortness of breath, or chest pain  1) How long have you had palpitations/irregular HR/ Afib? Are you having the symptoms now?    Since christmas day   2) Are you currently experiencing lightheadedness, SOB or CP? Lightheaded   3) Do you have a history of afib (atrial fibrillation) or irregular heart rhythm? yes  4) Have you checked your BP or HR? (document readings if available): 120's pulse 120/63 bp   5) Are you experiencing any other symptoms? No

## 2020-09-13 ENCOUNTER — Encounter: Payer: Self-pay | Admitting: Cardiology

## 2020-09-13 ENCOUNTER — Ambulatory Visit (INDEPENDENT_AMBULATORY_CARE_PROVIDER_SITE_OTHER): Payer: Medicare Other

## 2020-09-13 ENCOUNTER — Other Ambulatory Visit: Payer: Self-pay

## 2020-09-13 ENCOUNTER — Ambulatory Visit (INDEPENDENT_AMBULATORY_CARE_PROVIDER_SITE_OTHER): Payer: Medicare Other | Admitting: Cardiology

## 2020-09-13 VITALS — BP 140/70 | HR 78 | Ht 61.0 in | Wt 125.0 lb

## 2020-09-13 DIAGNOSIS — Z01812 Encounter for preprocedural laboratory examination: Secondary | ICD-10-CM

## 2020-09-13 DIAGNOSIS — Z5181 Encounter for therapeutic drug level monitoring: Secondary | ICD-10-CM

## 2020-09-13 DIAGNOSIS — Z953 Presence of xenogenic heart valve: Secondary | ICD-10-CM

## 2020-09-13 DIAGNOSIS — Z950 Presence of cardiac pacemaker: Secondary | ICD-10-CM | POA: Diagnosis not present

## 2020-09-13 DIAGNOSIS — Z952 Presence of prosthetic heart valve: Secondary | ICD-10-CM

## 2020-09-13 DIAGNOSIS — I4819 Other persistent atrial fibrillation: Secondary | ICD-10-CM | POA: Diagnosis not present

## 2020-09-13 DIAGNOSIS — I340 Nonrheumatic mitral (valve) insufficiency: Secondary | ICD-10-CM

## 2020-09-13 LAB — POCT INR: INR: 2.6 (ref 2.0–3.0)

## 2020-09-13 NOTE — Progress Notes (Signed)
Electrophysiology Office Follow up Visit Note:    Date:  09/13/2020   ID:  [name], DOB 03/02/1935, MRN [id number]  PCP:  Ezequiel Kayser, MD  [facility] Cardiologist:  Kathlyn Sacramento, MD  [facility] Electrophysiologist:  Virl Axe, MD    Interval History:    [name] is a 86 y.o. female who presents for a follow up visit.  She is followed by Dr. Jolyn Nap.  She presents today given palpitations and general malaise that has been present since September 04, 2020.  Review of her device interrogation since that time shows persistent atrial fibrillation/flutter since that date.  No other changes.  She has been on uninterrupted anticoagulation with therapeutic INRs.  She presents today for an attempt to use her device to pace terminate the atrial arrhythmias.    Past Medical History:  Diagnosis Date  . Anxiety   . Arthritis    "hands" (11/10/2015)  . Breast cancer ([place]) 2002   right  . CAD s/p CABG x 1    a. 04/2015 LIMA to LAD  . Chronic combined systolic (congestive) and diastolic (congestive) heart failure ([place])    a. 01/2015 Echo: EF 50-55%, Gr2 DD (preop valve surgery); b. 08/2015 Echo: EF 30-35%, diff HK. Gr2 DD; c. 09/2016 Echo: EF 50-55%; d. 07/2018 Echo: EF 20-25%; e. 03/2019 Echo: EF 35-40%, diff HK. Mod red RV fxn, RVSP 34.53mmHg. Mildly dil LA. Nl MV prosthesis. Triv AI.  Marland Kitchen COPD (chronic obstructive pulmonary disease) ([place])   . Coronary artery disease   . GERD (gastroesophageal reflux disease)   . History of colon polyps   . History of mitral valve replacement    a. 04/2015 s/p 27 mm [facility] Mitral bovine bioprosthetic tissue valve  . Hyperlipidemia   . Hypertension   . Hypothyroidism   . 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 04/2015 Complete bilateral atrial lesion set using cryothermy and bipolar radiofrequency ablation with clipping of LA appendage  . Meniere disease   . Meniere's disease   . On home oxygen therapy    "2L at night" (11/10/2015)   . PAF (paroxysmal atrial fibrillation) ([place])   . Personal history of radiation therapy   . Pneumonia ~ 2010  . PONV (postoperative nausea and vomiting)    Pt felt like eardrums were gonna pop  . Post-surgical complete heart block, symptomatic    a. 04/2015 s/p MDT [id number] Advisa DR MRI DC PPM.  . Presence of permanent cardiac pacemaker   . Pulmonary hypertension ([place])   . Surgical wound, non healing - chest wall 11/10/2015  . Tricuspid Regurgitation s/p Repair    a. 04/2015 s/p 26 mm Edwards mc3 ring annuloplasty  . Wound infection 10/13/2015   Superficial sternal wound infection    Past Surgical History:  Procedure Laterality Date  . APPENDECTOMY    . APPLICATION OF A-CELL OF CHEST/ABDOMEN N/A 11/21/2015   Procedure: APPLICATION OF A-CELL OF CHEST/ABDOMEN;  Surgeon: Loel Lofty Dillingham, DO;  Location: [place];  Service: Plastics;  Laterality: N/A;  . APPLICATION OF A-CELL OF CHEST/ABDOMEN Right 12/21/2015   Procedure: APPLICATION OF A-CELL OF RIGHT CHEST;  Surgeon: Loel Lofty Dillingham, DO;  Location: [place];  Service: Plastics;  Laterality: Right;  . APPLICATION OF A-CELL OF CHEST/ABDOMEN Right 01/11/2016   Procedure: APPLICATION OF A-CELL OF RIGHT CHEST;  Surgeon: Loel Lofty Dillingham, DO;  Location: [place];  Service: Plastics;  Laterality: Right;  . APPLICATION OF A-CELL OF  CHEST/ABDOMEN Right 02/12/2016   Procedure: APPLICATION OF A-CELL TO RIGHT CHEST WOUND;  Surgeon: Loel Lofty Dillingham, DO;  Location: [place];  Service: Plastics;  Laterality: Right;  . APPLICATION OF WOUND VAC N/A 05/09/2015   Procedure: APPLICATION OF WOUND VAC;  Surgeon: Rexene Alberts, MD;  Location: [place];  Service: Thoracic;  Laterality: N/A;  . APPLICATION OF WOUND VAC N/A 11/13/2015   Procedure: APPLICATION OF WOUND VAC;  Surgeon: Rexene Alberts, MD;  Location: [place];  Service: Thoracic;  Laterality: N/A;  . APPLICATION OF WOUND VAC N/A 11/21/2015   Procedure: APPLICATION OF WOUND VAC;  Surgeon: Loel Lofty Dillingham, DO;   Location: [place];  Service: Plastics;  Laterality: N/A;  . APPLICATION OF WOUND VAC Right 12/21/2015   Procedure: APPLICATION OF WOUND VAC to right chest wall ;  Surgeon: Loel Lofty Dillingham, DO;  Location: [place];  Service: Plastics;  Laterality: Right;  . APPLICATION OF WOUND VAC Right 01/11/2016   Procedure: APPLICATION OF WOUND VAC RIGHT CHEST ;  Surgeon: Loel Lofty Dillingham, DO;  Location: [place];  Service: Plastics;  Laterality: Right;  . APPLICATION OF WOUND VAC Right 01/24/2016   Procedure: APPLICATION OF WOUND VAC RIGHT CHEST WALL;  Surgeon: Loel Lofty Dillingham, DO;  Location: [place];  Service: Plastics;  Laterality: Right;  . APPLICATION OF WOUND VAC Right 02/12/2016   Procedure: RE-APPLICATION OF WOUND VAC TO RIGHT CHEST WOUND;  Surgeon: Loel Lofty Dillingham, DO;  Location: [place];  Service: Plastics;  Laterality: Right;  . BIV UPGRADE N/A 06/07/2019   Procedure: BIV UPGRADE;  Surgeon: Deboraha Sprang, MD;  Location: [place] [facility];  Service: Cardiovascular;  Laterality: N/A;  . BREAST BIOPSY Left 11/25/06   neg  . BREAST BIOPSY Left 01/20/12   /clip-neg  . CARDIAC CATHETERIZATION  11/2013   [facility]  . CARDIAC CATHETERIZATION  10/2014   [facility]  . CARDIAC VALVE REPLACEMENT    . CARDIOVERSION N/A 07/20/2018   Procedure: CARDIOVERSION;  Surgeon: Wellington Hampshire, MD;  Location: [facility];  Service: Cardiovascular;  Laterality: N/A;  . CATARACT EXTRACTION W/ INTRAOCULAR LENS  IMPLANT, BILATERAL Bilateral 2014  . CLIPPING OF ATRIAL APPENDAGE N/A 04/25/2015   Procedure: CLIPPING OF ATRIAL APPENDAGE;  Surgeon: Rexene Alberts, MD;  Location: [place];  Service: Open Heart Surgery;  Laterality: N/A;  . COCHLEAR IMPLANT Left 2005?  . COLONOSCOPY WITH PROPOFOL N/A 01/20/2018   Procedure: COLONOSCOPY WITH PROPOFOL;  Surgeon: Toledo, Benay Pike, MD;  Location: [facility];  Service: Gastroenterology;  Laterality: N/A;  . CORONARY ANGIOPLASTY    . CORONARY ARTERY BYPASS GRAFT N/A 04/25/2015   Procedure:  CORONARY ARTERY BYPASS GRAFTING (CABG) x ONE, using left internal mammary artery;  Surgeon: Rexene Alberts, MD;  Location: [place];  Service: Open Heart Surgery;  Laterality: N/A;  . DILATION AND CURETTAGE OF UTERUS  "several before hysterectomy"  . EP IMPLANTABLE DEVICE N/A 05/02/2015   Procedure: Pacemaker Implant;  Surgeon: Thompson Grayer, MD;  Location: [place] [facility];  Service: Cardiovascular;  Laterality: N/A;  . ESOPHAGOGASTRODUODENOSCOPY (EGD) WITH PROPOFOL N/A 01/20/2018   Procedure: ESOPHAGOGASTRODUODENOSCOPY (EGD) WITH PROPOFOL;  Surgeon: Toledo, Benay Pike, MD;  Location: [facility];  Service: Gastroenterology;  Laterality: N/A;  . EYE SURGERY    . I & D EXTREMITY Right 12/06/2015   Procedure: IRRIGATION AND DEBRIDEMENT RIGHT CHEST WALL WITH ACELL PLACEMENT AND VAC;  Surgeon: Loel Lofty Dillingham, DO;  Location: [place];  Service: Plastics;  Laterality: Right;  . INCISION AND DRAINAGE OF WOUND N/A 11/21/2015   Procedure: IRRIGATION AND DEBRIDEMENT WOUND;  Surgeon: Loel Lofty Dillingham, DO;  Location: [place];  Service: Plastics;  Laterality: N/A;  . INCISION AND DRAINAGE OF WOUND Right 12/21/2015   Procedure: IRRIGATION AND DEBRIDEMENT right chest wall WOUND;  Surgeon: Loel Lofty Dillingham, DO;  Location: [place];  Service: Plastics;  Laterality: Right;  right chest wall   . INCISION AND DRAINAGE OF WOUND Right 01/24/2016   Procedure: IRRIGATION AND DEBRIDEMENT RIGHT CHEST WALL WOUND;  Surgeon: Loel Lofty Dillingham, DO;  Location: [place];  Service: Plastics;  Laterality: Right;  . INCISION AND DRAINAGE OF WOUND Right 03/28/2016   Procedure: IRRIGATION AND DEBRIDEMENT RIGHT CHEST WALL WOUND WITH A Cell Placement;  Surgeon: Loel Lofty Dillingham, DO;  Location: [place];  Service: Plastics;  Laterality: Right;  . INCISION AND DRAINAGE OF WOUND Right 05/17/2016   Procedure: IRRIGATION AND DEBRIDEMENT OF RIGHT CHEST WOUND WITH A CELL PLACEMENT;  Surgeon: Loel Lofty Dillingham, DO;  Location: [place];  Service:  Plastics;  Laterality: Right;  . INSERT / REPLACE / REMOVE PACEMAKER    . IRRIGATION AND DEBRIDEMENT OF WOUND WITH SPLIT THICKNESS SKIN GRAFT Right 01/11/2016   Procedure: IRRIGATION AND DEBRIDEMENT OF RIGHT CHEST WOUND ;  Surgeon: Loel Lofty Dillingham, DO;  Location: [place];  Service: Plastics;  Laterality: Right;  . MASTECTOMY, PARTIAL Right 2002   positive, partial  . MAZE N/A 04/25/2015   Procedure: MAZE;  Surgeon: Rexene Alberts, MD;  Location: [place];  Service: Open Heart Surgery;  Laterality: N/A;  . MITRAL VALVE REPAIR N/A 04/25/2015   Procedure: MITRAL VALVE  REPLACEMENT using a 27 mm Edwards Perimount Magna Mitral Ease Valve;  Surgeon: Rexene Alberts, MD;  Location: [place];  Service: Open Heart Surgery;  Laterality: N/A;  . RIGHT HEART CATH N/A 02/08/2019   Procedure: RIGHT HEART CATH;  Surgeon: Wellington Hampshire, MD;  Location: [place] [facility];  Service: Cardiovascular;  Laterality: N/A;  . SKIN SPLIT GRAFT Right 02/12/2016   Procedure: IRRIGATION AND DEBRIDEMENT RIGHT CHEST WOUND;  Surgeon: Loel Lofty Dillingham, DO;  Location: [place];  Service: Plastics;  Laterality: Right;  . STERNAL WIRES REMOVAL N/A 06/05/2015   Procedure: STERNAL WIRES REMOVAL;  Surgeon: Rexene Alberts, MD;  Location: [place];  Service: Thoracic;  Laterality: N/A;  . STERNAL WOUND DEBRIDEMENT N/A 05/09/2015   Procedure: STERNAL WOUND DEBRIDEMENT;  Surgeon: Rexene Alberts, MD;  Location: [place];  Service: Thoracic;  Laterality: N/A;  . STERNAL WOUND DEBRIDEMENT N/A 11/13/2015   Procedure: Excisional drainage of RIGHT Chest wall mass and breast mass ;  Surgeon: Rexene Alberts, MD;  Location: [place];  Service: Thoracic;  Laterality: N/A;  . TEE WITHOUT CARDIOVERSION N/A 04/25/2015   Procedure: TRANSESOPHAGEAL ECHOCARDIOGRAM (TEE);  Surgeon: Rexene Alberts, MD;  Location: [place];  Service: Open Heart Surgery;  Laterality: N/A;  . TONSILLECTOMY    . TRAM Right 11/18/2015   Procedure: VRAM Vertical Rectus Abdominus Muscle Flap;   Surgeon: Loel Lofty Dillingham, DO;  Location: [place];  Service: Plastics;  Laterality: Right;  RIght Back  . TRICUSPID VALVE REPLACEMENT N/A 04/25/2015   Procedure: TRICUSPID VALVE REPAIR;  Surgeon: Rexene Alberts, MD;  Location: [place];  Service: Open Heart Surgery;  Laterality: N/A;  . VAGINAL HYSTERECTOMY      Current Medications: Current Meds  Medication Sig  . acetaminophen (TYLENOL) 500 MG tablet  Take 1,000 mg by mouth every 6 (six) hours as needed (for pain.).  Marland Kitchen AMBULATORY NON FORMULARY MEDICATION Inogen at Home Oxygen machine with tubing. DX: COPD DX Code: J44.9  . amiodarone (PACERONE) 200 MG tablet Take 1 tablet (200 mg total) by mouth daily.  . bisoprolol (ZEBETA) 5 MG tablet TAKE 1 TABLET BY MOUTH EVERY DAY  . diphenhydrAMINE (BENADRYL) 25 mg capsule Take 25 mg by mouth every 6 (six) hours as needed for allergies.  Marland Kitchen docusate sodium (COLACE) 100 MG capsule Take 100 mg by mouth 2 (two) times daily as needed (constipation.).  Marland Kitchen furosemide (LASIX) 20 MG tablet Take 20-40 mg by mouth See admin instructions. Take 2 tablets (40 mg) by mouth in the morning & take 1 tablet (20 mg) by mouth in the afternoon.  . gabapentin (NEURONTIN) 100 MG capsule Take 200 mg by mouth at bedtime.   Marland Kitchen ipratropium-albuterol (DUONEB) 0.5-2.5 (3) MG/3ML SOLN Take 3 mLs by nebulization every 6 (six) hours as needed. DX:J44.9  . levothyroxine (SYNTHROID, LEVOTHROID) 88 MCG tablet Take 88 mcg by mouth daily before breakfast.  . Multiple Vitamins-Minerals (PRESERVISION AREDS 2 PO) Take 1 tablet by mouth 2 (two) times daily.   . pantoprazole (PROTONIX) 40 MG tablet Take 40 mg by mouth 2 (two) times daily.   . sacubitril-valsartan (ENTRESTO) 49-51 MG TAKE 1 TABLET (49/51 MG) BY MOUTH TWICE DAILY *DOSE INCREASE*  . simvastatin (ZOCOR) 10 MG tablet Take 1 tablet (10 mg total) by mouth at bedtime.  Marland Kitchen spironolactone (ALDACTONE) 25 MG tablet Take 1 tablet (25 mg total) by mouth daily.  Marland Kitchen warfarin (COUMADIN) 2.5 MG tablet  TAKE AS DIRECTED PER COUMADIN CLINIC     Allergies:   Ace inhibitors, Amoxicillin-pot clavulanate, Penicillins, Nifedipine, Propranolol, Diazepam, Meperidine, and Propoxyphene   Social History   Socioeconomic History  . Marital status: Married    Spouse name: Not on file  . Number of children: Not on file  . Years of education: Not on file  . Highest education level: Not on file  Occupational History  . Not on file  Tobacco Use  . Smoking status: Former Smoker    Packs/day: 1.50    Years: 40.00    Pack years: 60.00    Types: Cigarettes    Quit date: 04/20/1998    Years since quitting: 22.4  . Smokeless tobacco: Never Used  Vaping Use  . Vaping Use: Never used  Substance and Sexual Activity  . Alcohol use: Yes    Comment: 11/10/2015 "glass of wine a few times/year, if that"  . Drug use: No  . Sexual activity: Yes  Other Topics Concern  . Not on file  Social History Narrative  . Not on file   Social Determinants of Health   Financial Resource Strain: Not on file  Food Insecurity: Not on file  Transportation Needs: Not on file  Physical Activity: Not on file  Stress: Not on file  Social Connections: Not on file     Family History: The patient's family history includes Breast cancer in her maternal aunt and paternal aunt; CVA in her mother; Heart attack in her paternal uncle; Heart disease in her brother and father.  ROS:   Please see the history of present illness.    All other systems reviewed and are negative.  EKGs/Labs/Other Studies Reviewed:    The following studies were reviewed today:       EKG:  The ekg ordered today demonstrates atrial fibrillation with incomplete right bundle  branch block and intermittent ventricular pacing  September 13, 2020 in clinic device interrogation Presenting rhythm atrial flutter with intermittent ventricular pacing Longevity 3.5 years Atrial lead impedance 380 ohms, capture threshold 0.375 V at 0.4, P waves 1.4 mV Right  ventricular lead impedance 475 ohms, capture threshold 0.625 V at 0.4, R waves 9 mV AA IR to DDDR 60-100 Patient activity averaging 4 to 5 hours/day  Recent Labs: 07/20/2020: ALT 14; BUN 14; Creatinine, Ser 0.82; Potassium 4.1; Sodium 143; TSH 0.824  Recent Lipid Panel    Component Value Date/Time   TRIG 48 02/22/2019 1100    Physical Exam:    VS:  BP 140/70 (BP Location: Left Arm, Patient Position: Sitting, Cuff Size: Normal)   Pulse 78   Ht 5\' 1"  (1.549 m)   Wt 125 lb (56.7 kg)   SpO2 97% Comment: on 2L O2  BMI 23.62 kg/m     Wt Readings from Last 3 Encounters:  09/13/20 125 lb (56.7 kg)  07/20/20 127 lb 2 oz (57.7 kg)  06/08/20 127 lb (57.6 kg)     GEN:  Well nourished, well developed in no acute distress HEENT: Normal NECK: No JVD; No carotid bruits LYMPHATICS: No lymphadenopathy CARDIAC: Irregularly irregular, no murmurs, rubs, gallops.  Pacemaker pocket well-healed RESPIRATORY:  Clear to auscultation without rales, wheezing or rhonchi  ABDOMEN: Soft, non-tender, non-distended MUSCULOSKELETAL:  No edema; No deformity  SKIN: Warm and dry NEUROLOGIC:  Alert and oriented x 3 PSYCHIATRIC:  Normal affect   ASSESSMENT:    1. Persistent atrial fibrillation ([place])   2. Pre-procedure lab exam   3. S/P MVR (mitral valve replacement)   4. Status post mitral valve replacement with bioprosthetic valve   5. Cardiac pacemaker in situ    PLAN:    In order of problems listed above:  1. Persistent atrial fibrillation and flutter post mitral valve replacement and maze and CABG in 2016 Now with symptomatic atrial flutter since December 27.  Atrial cycle length is between 200-230 ms with some beat to beat variability.  Given she has been on uninterrupted anticoagulation with therapeutic INR, I attempted to pace terminate her atrial arrhythmia during today's visit.  With burst pacing down to 180 ms, I was never able to terminate the atrial arrhythmia despite clearly getting into  the circuit.  I suspect she is having a microreentrant atrial flutter given the consistent atrial EGM but extremely quick atrial cycle length.  Likely close to a previous atriotomy or maze lesion. At this point, recommended to the patient that we proceed with scheduling a cardioversion to restore normal rhythm.  She should continue her Coumadin for now.  We will get the scheduled.  In the meantime she can continue her amiodarone 200 mg daily.  2.  Pacemaker in situ Presenting rhythm is atrial flutter with intermittent ventricular pacing. In clinic device interrogation shows stable lead parameters.    Medication Adjustments/Labs and Tests Ordered: Current medicines are reviewed at length with the patient today.  Concerns regarding medicines are outlined above.  Orders Placed This Encounter  Procedures  . Basic metabolic panel  . CBC w/Diff  . EKG 12-Lead   No orders of the defined types were placed in this encounter.    Signed, Lars Mage, MD, [facility]  09/13/2020 7:03 PM    Electrophysiology [place]

## 2020-09-13 NOTE — H&P (View-Only) (Signed)
Electrophysiology Office Follow up Visit Note:    Date:  09/13/2020   ID:  [name], DOB 04-Nov-1934, MRN [id number]  PCP:  Ezequiel Kayser, MD  [facility] Cardiologist:  Kathlyn Sacramento, MD  [facility] Electrophysiologist:  Virl Axe, MD    Interval History:    [name] is a 86 y.o. female who presents for a follow up visit.  She is followed by Dr. Jolyn Nap.  She presents today given palpitations and general malaise that has been present since September 04, 2020.  Review of her device interrogation since that time shows persistent atrial fibrillation/flutter since that date.  No other changes.  She has been on uninterrupted anticoagulation with therapeutic INRs.  She presents today for an attempt to use her device to pace terminate the atrial arrhythmias.    Past Medical History:  Diagnosis Date  . Anxiety   . Arthritis    "hands" (11/10/2015)  . Breast cancer ([place]) 2002   right  . CAD s/p CABG x 1    a. 04/2015 LIMA to LAD  . Chronic combined systolic (congestive) and diastolic (congestive) heart failure ([place])    a. 01/2015 Echo: EF 50-55%, Gr2 DD (preop valve surgery); b. 08/2015 Echo: EF 30-35%, diff HK. Gr2 DD; c. 09/2016 Echo: EF 50-55%; d. 07/2018 Echo: EF 20-25%; e. 03/2019 Echo: EF 35-40%, diff HK. Mod red RV fxn, RVSP 34.43mmHg. Mildly dil LA. Nl MV prosthesis. Triv AI.  Marland Kitchen COPD (chronic obstructive pulmonary disease) ([place])   . Coronary artery disease   . GERD (gastroesophageal reflux disease)   . History of colon polyps   . History of mitral valve replacement    a. 04/2015 s/p 27 mm [facility] Mitral bovine bioprosthetic tissue valve  . Hyperlipidemia   . Hypertension   . Hypothyroidism   . 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 04/2015 Complete bilateral atrial lesion set using cryothermy and bipolar radiofrequency ablation with clipping of LA appendage  . Meniere disease   . Meniere's disease   . On home oxygen therapy    "2L at night" (11/10/2015)   . PAF (paroxysmal atrial fibrillation) ([place])   . Personal history of radiation therapy   . Pneumonia ~ 2010  . PONV (postoperative nausea and vomiting)    Pt felt like eardrums were gonna pop  . Post-surgical complete heart block, symptomatic    a. 04/2015 s/p MDT [id number] Advisa DR MRI DC PPM.  . Presence of permanent cardiac pacemaker   . Pulmonary hypertension ([place])   . Surgical wound, non healing - chest wall 11/10/2015  . Tricuspid Regurgitation s/p Repair    a. 04/2015 s/p 26 mm Edwards mc3 ring annuloplasty  . Wound infection 10/13/2015   Superficial sternal wound infection    Past Surgical History:  Procedure Laterality Date  . APPENDECTOMY    . APPLICATION OF A-CELL OF CHEST/ABDOMEN N/A 11/21/2015   Procedure: APPLICATION OF A-CELL OF CHEST/ABDOMEN;  Surgeon: Loel Lofty Dillingham, DO;  Location: [place];  Service: Plastics;  Laterality: N/A;  . APPLICATION OF A-CELL OF CHEST/ABDOMEN Right 12/21/2015   Procedure: APPLICATION OF A-CELL OF RIGHT CHEST;  Surgeon: Loel Lofty Dillingham, DO;  Location: [place];  Service: Plastics;  Laterality: Right;  . APPLICATION OF A-CELL OF CHEST/ABDOMEN Right 01/11/2016   Procedure: APPLICATION OF A-CELL OF RIGHT CHEST;  Surgeon: Loel Lofty Dillingham, DO;  Location: [place];  Service: Plastics;  Laterality: Right;  . APPLICATION OF A-CELL OF  CHEST/ABDOMEN Right 02/12/2016   Procedure: APPLICATION OF A-CELL TO RIGHT CHEST WOUND;  Surgeon: Loel Lofty Dillingham, DO;  Location: [place];  Service: Plastics;  Laterality: Right;  . APPLICATION OF WOUND VAC N/A 05/09/2015   Procedure: APPLICATION OF WOUND VAC;  Surgeon: Rexene Alberts, MD;  Location: [place];  Service: Thoracic;  Laterality: N/A;  . APPLICATION OF WOUND VAC N/A 11/13/2015   Procedure: APPLICATION OF WOUND VAC;  Surgeon: Rexene Alberts, MD;  Location: [place];  Service: Thoracic;  Laterality: N/A;  . APPLICATION OF WOUND VAC N/A 11/21/2015   Procedure: APPLICATION OF WOUND VAC;  Surgeon: Loel Lofty Dillingham, DO;   Location: [place];  Service: Plastics;  Laterality: N/A;  . APPLICATION OF WOUND VAC Right 12/21/2015   Procedure: APPLICATION OF WOUND VAC to right chest wall ;  Surgeon: Loel Lofty Dillingham, DO;  Location: [place];  Service: Plastics;  Laterality: Right;  . APPLICATION OF WOUND VAC Right 01/11/2016   Procedure: APPLICATION OF WOUND VAC RIGHT CHEST ;  Surgeon: Loel Lofty Dillingham, DO;  Location: [place];  Service: Plastics;  Laterality: Right;  . APPLICATION OF WOUND VAC Right 01/24/2016   Procedure: APPLICATION OF WOUND VAC RIGHT CHEST WALL;  Surgeon: Loel Lofty Dillingham, DO;  Location: [place];  Service: Plastics;  Laterality: Right;  . APPLICATION OF WOUND VAC Right 02/12/2016   Procedure: RE-APPLICATION OF WOUND VAC TO RIGHT CHEST WOUND;  Surgeon: Loel Lofty Dillingham, DO;  Location: [place];  Service: Plastics;  Laterality: Right;  . BIV UPGRADE N/A 06/07/2019   Procedure: BIV UPGRADE;  Surgeon: Deboraha Sprang, MD;  Location: [place] [facility];  Service: Cardiovascular;  Laterality: N/A;  . BREAST BIOPSY Left 11/25/06   neg  . BREAST BIOPSY Left 01/20/12   /clip-neg  . CARDIAC CATHETERIZATION  11/2013   [facility]  . CARDIAC CATHETERIZATION  10/2014   [facility]  . CARDIAC VALVE REPLACEMENT    . CARDIOVERSION N/A 07/20/2018   Procedure: CARDIOVERSION;  Surgeon: Wellington Hampshire, MD;  Location: [facility];  Service: Cardiovascular;  Laterality: N/A;  . CATARACT EXTRACTION W/ INTRAOCULAR LENS  IMPLANT, BILATERAL Bilateral 2014  . CLIPPING OF ATRIAL APPENDAGE N/A 04/25/2015   Procedure: CLIPPING OF ATRIAL APPENDAGE;  Surgeon: Rexene Alberts, MD;  Location: [place];  Service: Open Heart Surgery;  Laterality: N/A;  . COCHLEAR IMPLANT Left 2005?  . COLONOSCOPY WITH PROPOFOL N/A 01/20/2018   Procedure: COLONOSCOPY WITH PROPOFOL;  Surgeon: Toledo, Benay Pike, MD;  Location: [facility];  Service: Gastroenterology;  Laterality: N/A;  . CORONARY ANGIOPLASTY    . CORONARY ARTERY BYPASS GRAFT N/A 04/25/2015   Procedure:  CORONARY ARTERY BYPASS GRAFTING (CABG) x ONE, using left internal mammary artery;  Surgeon: Rexene Alberts, MD;  Location: [place];  Service: Open Heart Surgery;  Laterality: N/A;  . DILATION AND CURETTAGE OF UTERUS  "several before hysterectomy"  . EP IMPLANTABLE DEVICE N/A 05/02/2015   Procedure: Pacemaker Implant;  Surgeon: Thompson Grayer, MD;  Location: [place] [facility];  Service: Cardiovascular;  Laterality: N/A;  . ESOPHAGOGASTRODUODENOSCOPY (EGD) WITH PROPOFOL N/A 01/20/2018   Procedure: ESOPHAGOGASTRODUODENOSCOPY (EGD) WITH PROPOFOL;  Surgeon: Toledo, Benay Pike, MD;  Location: [facility];  Service: Gastroenterology;  Laterality: N/A;  . EYE SURGERY    . I & D EXTREMITY Right 12/06/2015   Procedure: IRRIGATION AND DEBRIDEMENT RIGHT CHEST WALL WITH ACELL PLACEMENT AND VAC;  Surgeon: Loel Lofty Dillingham, DO;  Location: [place];  Service: Plastics;  Laterality: Right;  . INCISION AND DRAINAGE OF WOUND N/A 11/21/2015   Procedure: IRRIGATION AND DEBRIDEMENT WOUND;  Surgeon: Loel Lofty Dillingham, DO;  Location: [place];  Service: Plastics;  Laterality: N/A;  . INCISION AND DRAINAGE OF WOUND Right 12/21/2015   Procedure: IRRIGATION AND DEBRIDEMENT right chest wall WOUND;  Surgeon: Loel Lofty Dillingham, DO;  Location: [place];  Service: Plastics;  Laterality: Right;  right chest wall   . INCISION AND DRAINAGE OF WOUND Right 01/24/2016   Procedure: IRRIGATION AND DEBRIDEMENT RIGHT CHEST WALL WOUND;  Surgeon: Loel Lofty Dillingham, DO;  Location: [place];  Service: Plastics;  Laterality: Right;  . INCISION AND DRAINAGE OF WOUND Right 03/28/2016   Procedure: IRRIGATION AND DEBRIDEMENT RIGHT CHEST WALL WOUND WITH A Cell Placement;  Surgeon: Loel Lofty Dillingham, DO;  Location: [place];  Service: Plastics;  Laterality: Right;  . INCISION AND DRAINAGE OF WOUND Right 05/17/2016   Procedure: IRRIGATION AND DEBRIDEMENT OF RIGHT CHEST WOUND WITH A CELL PLACEMENT;  Surgeon: Loel Lofty Dillingham, DO;  Location: [place];  Service:  Plastics;  Laterality: Right;  . INSERT / REPLACE / REMOVE PACEMAKER    . IRRIGATION AND DEBRIDEMENT OF WOUND WITH SPLIT THICKNESS SKIN GRAFT Right 01/11/2016   Procedure: IRRIGATION AND DEBRIDEMENT OF RIGHT CHEST WOUND ;  Surgeon: Loel Lofty Dillingham, DO;  Location: [place];  Service: Plastics;  Laterality: Right;  . MASTECTOMY, PARTIAL Right 2002   positive, partial  . MAZE N/A 04/25/2015   Procedure: MAZE;  Surgeon: Rexene Alberts, MD;  Location: [place];  Service: Open Heart Surgery;  Laterality: N/A;  . MITRAL VALVE REPAIR N/A 04/25/2015   Procedure: MITRAL VALVE  REPLACEMENT using a 27 mm Edwards Perimount Magna Mitral Ease Valve;  Surgeon: Rexene Alberts, MD;  Location: [place];  Service: Open Heart Surgery;  Laterality: N/A;  . RIGHT HEART CATH N/A 02/08/2019   Procedure: RIGHT HEART CATH;  Surgeon: Wellington Hampshire, MD;  Location: [place] [facility];  Service: Cardiovascular;  Laterality: N/A;  . SKIN SPLIT GRAFT Right 02/12/2016   Procedure: IRRIGATION AND DEBRIDEMENT RIGHT CHEST WOUND;  Surgeon: Loel Lofty Dillingham, DO;  Location: [place];  Service: Plastics;  Laterality: Right;  . STERNAL WIRES REMOVAL N/A 06/05/2015   Procedure: STERNAL WIRES REMOVAL;  Surgeon: Rexene Alberts, MD;  Location: [place];  Service: Thoracic;  Laterality: N/A;  . STERNAL WOUND DEBRIDEMENT N/A 05/09/2015   Procedure: STERNAL WOUND DEBRIDEMENT;  Surgeon: Rexene Alberts, MD;  Location: [place];  Service: Thoracic;  Laterality: N/A;  . STERNAL WOUND DEBRIDEMENT N/A 11/13/2015   Procedure: Excisional drainage of RIGHT Chest wall mass and breast mass ;  Surgeon: Rexene Alberts, MD;  Location: [place];  Service: Thoracic;  Laterality: N/A;  . TEE WITHOUT CARDIOVERSION N/A 04/25/2015   Procedure: TRANSESOPHAGEAL ECHOCARDIOGRAM (TEE);  Surgeon: Rexene Alberts, MD;  Location: [place];  Service: Open Heart Surgery;  Laterality: N/A;  . TONSILLECTOMY    . TRAM Right 11/18/2015   Procedure: VRAM Vertical Rectus Abdominus Muscle Flap;   Surgeon: Loel Lofty Dillingham, DO;  Location: [place];  Service: Plastics;  Laterality: Right;  RIght Back  . TRICUSPID VALVE REPLACEMENT N/A 04/25/2015   Procedure: TRICUSPID VALVE REPAIR;  Surgeon: Rexene Alberts, MD;  Location: [place];  Service: Open Heart Surgery;  Laterality: N/A;  . VAGINAL HYSTERECTOMY      Current Medications: Current Meds  Medication Sig  . acetaminophen (TYLENOL) 500 MG tablet  Take 1,000 mg by mouth every 6 (six) hours as needed (for pain.).  Marland Kitchen AMBULATORY NON FORMULARY MEDICATION Inogen at Home Oxygen machine with tubing. DX: COPD DX Code: J44.9  . amiodarone (PACERONE) 200 MG tablet Take 1 tablet (200 mg total) by mouth daily.  . bisoprolol (ZEBETA) 5 MG tablet TAKE 1 TABLET BY MOUTH EVERY DAY  . diphenhydrAMINE (BENADRYL) 25 mg capsule Take 25 mg by mouth every 6 (six) hours as needed for allergies.  Marland Kitchen docusate sodium (COLACE) 100 MG capsule Take 100 mg by mouth 2 (two) times daily as needed (constipation.).  Marland Kitchen furosemide (LASIX) 20 MG tablet Take 20-40 mg by mouth See admin instructions. Take 2 tablets (40 mg) by mouth in the morning & take 1 tablet (20 mg) by mouth in the afternoon.  . gabapentin (NEURONTIN) 100 MG capsule Take 200 mg by mouth at bedtime.   Marland Kitchen ipratropium-albuterol (DUONEB) 0.5-2.5 (3) MG/3ML SOLN Take 3 mLs by nebulization every 6 (six) hours as needed. DX:J44.9  . levothyroxine (SYNTHROID, LEVOTHROID) 88 MCG tablet Take 88 mcg by mouth daily before breakfast.  . Multiple Vitamins-Minerals (PRESERVISION AREDS 2 PO) Take 1 tablet by mouth 2 (two) times daily.   . pantoprazole (PROTONIX) 40 MG tablet Take 40 mg by mouth 2 (two) times daily.   . sacubitril-valsartan (ENTRESTO) 49-51 MG TAKE 1 TABLET (49/51 MG) BY MOUTH TWICE DAILY *DOSE INCREASE*  . simvastatin (ZOCOR) 10 MG tablet Take 1 tablet (10 mg total) by mouth at bedtime.  Marland Kitchen spironolactone (ALDACTONE) 25 MG tablet Take 1 tablet (25 mg total) by mouth daily.  Marland Kitchen warfarin (COUMADIN) 2.5 MG tablet  TAKE AS DIRECTED PER COUMADIN CLINIC     Allergies:   Ace inhibitors, Amoxicillin-pot clavulanate, Penicillins, Nifedipine, Propranolol, Diazepam, Meperidine, and Propoxyphene   Social History   Socioeconomic History  . Marital status: Married    Spouse name: Not on file  . Number of children: Not on file  . Years of education: Not on file  . Highest education level: Not on file  Occupational History  . Not on file  Tobacco Use  . Smoking status: Former Smoker    Packs/day: 1.50    Years: 40.00    Pack years: 60.00    Types: Cigarettes    Quit date: 04/20/1998    Years since quitting: 22.4  . Smokeless tobacco: Never Used  Vaping Use  . Vaping Use: Never used  Substance and Sexual Activity  . Alcohol use: Yes    Comment: 11/10/2015 "glass of wine a few times/year, if that"  . Drug use: No  . Sexual activity: Yes  Other Topics Concern  . Not on file  Social History Narrative  . Not on file   Social Determinants of Health   Financial Resource Strain: Not on file  Food Insecurity: Not on file  Transportation Needs: Not on file  Physical Activity: Not on file  Stress: Not on file  Social Connections: Not on file     Family History: The patient's family history includes Breast cancer in her maternal aunt and paternal aunt; CVA in her mother; Heart attack in her paternal uncle; Heart disease in her brother and father.  ROS:   Please see the history of present illness.    All other systems reviewed and are negative.  EKGs/Labs/Other Studies Reviewed:    The following studies were reviewed today:       EKG:  The ekg ordered today demonstrates atrial fibrillation with incomplete right bundle  branch block and intermittent ventricular pacing  September 13, 2020 in clinic device interrogation Presenting rhythm atrial flutter with intermittent ventricular pacing Longevity 3.5 years Atrial lead impedance 380 ohms, capture threshold 0.375 V at 0.4, P waves 1.4 mV Right  ventricular lead impedance 475 ohms, capture threshold 0.625 V at 0.4, R waves 9 mV AA IR to DDDR 60-100 Patient activity averaging 4 to 5 hours/day  Recent Labs: 07/20/2020: ALT 14; BUN 14; Creatinine, Ser 0.82; Potassium 4.1; Sodium 143; TSH 0.824  Recent Lipid Panel    Component Value Date/Time   TRIG 48 02/22/2019 1100    Physical Exam:    VS:  BP 140/70 (BP Location: Left Arm, Patient Position: Sitting, Cuff Size: Normal)   Pulse 78   Ht 5\' 1"  (1.549 m)   Wt 125 lb (56.7 kg)   SpO2 97% Comment: on 2L O2  BMI 23.62 kg/m     Wt Readings from Last 3 Encounters:  09/13/20 125 lb (56.7 kg)  07/20/20 127 lb 2 oz (57.7 kg)  06/08/20 127 lb (57.6 kg)     GEN:  Well nourished, well developed in no acute distress HEENT: Normal NECK: No JVD; No carotid bruits LYMPHATICS: No lymphadenopathy CARDIAC: Irregularly irregular, no murmurs, rubs, gallops.  Pacemaker pocket well-healed RESPIRATORY:  Clear to auscultation without rales, wheezing or rhonchi  ABDOMEN: Soft, non-tender, non-distended MUSCULOSKELETAL:  No edema; No deformity  SKIN: Warm and dry NEUROLOGIC:  Alert and oriented x 3 PSYCHIATRIC:  Normal affect   ASSESSMENT:    1. Persistent atrial fibrillation ([place])   2. Pre-procedure lab exam   3. S/P MVR (mitral valve replacement)   4. Status post mitral valve replacement with bioprosthetic valve   5. Cardiac pacemaker in situ    PLAN:    In order of problems listed above:  1. Persistent atrial fibrillation and flutter post mitral valve replacement and maze and CABG in 2016 Now with symptomatic atrial flutter since December 27.  Atrial cycle length is between 200-230 ms with some beat to beat variability.  Given she has been on uninterrupted anticoagulation with therapeutic INR, I attempted to pace terminate her atrial arrhythmia during today's visit.  With burst pacing down to 180 ms, I was never able to terminate the atrial arrhythmia despite clearly getting into  the circuit.  I suspect she is having a microreentrant atrial flutter given the consistent atrial EGM but extremely quick atrial cycle length.  Likely close to a previous atriotomy or maze lesion. At this point, recommended to the patient that we proceed with scheduling a cardioversion to restore normal rhythm.  She should continue her Coumadin for now.  We will get the scheduled.  In the meantime she can continue her amiodarone 200 mg daily.  2.  Pacemaker in situ Presenting rhythm is atrial flutter with intermittent ventricular pacing. In clinic device interrogation shows stable lead parameters.    Medication Adjustments/Labs and Tests Ordered: Current medicines are reviewed at length with the patient today.  Concerns regarding medicines are outlined above.  Orders Placed This Encounter  Procedures  . Basic metabolic panel  . CBC w/Diff  . EKG 12-Lead   No orders of the defined types were placed in this encounter.    Signed, Lars Mage, MD, [facility]  09/13/2020 7:03 PM    Electrophysiology [place]

## 2020-09-13 NOTE — Patient Instructions (Addendum)
Medication Instructions:  - Your physician recommends that you continue on your current medications as directed. Please refer to the Current Medication list given to you today.  *If you need a refill on your cardiac medications before your next appointment, please call your pharmacy*   Lab Work:  1) Pre procedure lab work today: BMP/ CBC  2) Pre procedure COVID swab:Thursday 09/14/20 (8:00 am- 1:00 pm) [facility] up test only- staff will come out to the car to swab you   If you have labs (blood work) drawn today and your tests are completely normal, you will receive your results only by: Marland Kitchen MyChart Message (if you have MyChart) OR . A paper copy in the mail If you have any lab test that is abnormal or we need to change your treatment, we will call you to review the results.   Testing/Procedures: - Your physician has recommended that you have a Cardioversion (DCCV). Electrical Cardioversion uses a jolt of electricity to your heart either through paddles or wired patches attached to your chest. This is a controlled, usually prescheduled, procedure. Defibrillation is done under light anesthesia in the hospital, and you usually go home the day of the procedure. This is done to get your heart back into a normal rhythm. You are not awake for the procedure.   You are scheduled for a Cardioversion on Monday 09/18/20 with Dr. Fletcher Anon.   Please arrive at the [place] [facility] at 6:30 am on the day of your procedure.  DIET INSTRUCTIONS:  Nothing to eat or drink after midnight the night prior to your procedure.          1) Labs: as above  2) Medications:  You may take all of your regular medications the morning of your procedure with enough water to get them down safely unless listed below:   - HOLD lasix (furosemide) the morning of your procedure  3) Must have a responsible person to drive you home.  4) Bring a current list of your medications and current insurance  cards.    If you have any questions after you get home, please call the office at [phone]    Follow-Up: At [facility], you and your health needs are our priority.  As part of our continuing mission to provide you with exceptional heart care, we have created designated Provider Care Teams.  These Care Teams include your primary Cardiologist (physician) and Advanced Practice Providers (APPs -  Physician Assistants and Nurse Practitioners) who all work together to provide you with the care you need, when you need it.  We recommend signing up for the patient portal called "MyChart".  Sign up information is provided on this After Visit Summary.  MyChart is used to connect with patients for Virtual Visits (Telemedicine).  Patients are able to view lab/test results, encounter notes, upcoming appointments, etc.  Non-urgent messages can be sent to your provider as well.   To learn more about what you can do with MyChart, go to [email or web address].    Your next appointment:   As scheduled   The format for your next appointment:   In Person  Provider:   Virl Axe, MD   Other Instructions   Electrical Cardioversion Electrical cardioversion is the delivery of a jolt of electricity to restore a normal rhythm to the heart. A rhythm that is too fast or is not regular keeps the heart from pumping well. In this procedure, sticky patches or metal paddles are placed  on the chest to deliver electricity to the heart from a device. This procedure may be done in an emergency if:  There is low or no blood pressure as a result of the heart rhythm.  Normal rhythm must be restored as fast as possible to protect the brain and heart from further damage.  It may save a life. This may also be a scheduled procedure for irregular or fast heart rhythms that are not immediately life-threatening. Tell a health care provider about:  Any allergies you have.  All medicines you are taking, including  vitamins, herbs, eye drops, creams, and over-the-counter medicines.  Any problems you or family members have had with anesthetic medicines.  Any blood disorders you have.  Any surgeries you have had.  Any medical conditions you have.  Whether you are pregnant or may be pregnant. What are the risks? Generally, this is a safe procedure. However, problems may occur, including:  Allergic reactions to medicines.  A blood clot that breaks free and travels to other parts of your body.  The possible return of an abnormal heart rhythm within hours or days after the procedure.  Your heart stopping (cardiac arrest). This is rare. What happens before the procedure? Medicines  Your health care provider may have you start taking: ? Blood-thinning medicines (anticoagulants) so your blood does not clot as easily. ? Medicines to help stabilize your heart rate and rhythm.  Ask your health care provider about: ? Changing or stopping your regular medicines. This is especially important if you are taking diabetes medicines or blood thinners. ? Taking medicines such as aspirin and ibuprofen. These medicines can thin your blood. Do not take these medicines unless your health care provider tells you to take them. ? Taking over-the-counter medicines, vitamins, herbs, and supplements. General instructions  Follow instructions from your health care provider about eating or drinking restrictions.  Plan to have someone take you home from the hospital or clinic.  If you will be going home right after the procedure, plan to have someone with you for 24 hours.  Ask your health care provider what steps will be taken to help prevent infection. These may include washing your skin with a germ-killing soap. What happens during the procedure?   An IV will be inserted into one of your veins.  Sticky patches (electrodes) or metal paddles may be placed on your chest.  You will be given a medicine to help you  relax (sedative).  An electrical shock will be delivered. The procedure may vary among health care providers and hospitals. What can I expect after the procedure?  Your blood pressure, heart rate, breathing rate, and blood oxygen level will be monitored until you leave the hospital or clinic.  Your heart rhythm will be watched to make sure it does not change.  You may have some redness on the skin where the shocks were given. Follow these instructions at home:  Do not drive for 24 hours if you were given a sedative during your procedure.  Take over-the-counter and prescription medicines only as told by your health care provider.  Ask your health care provider how to check your pulse. Check it often.  Rest for 48 hours after the procedure or as told by your health care provider.  Avoid or limit your caffeine use as told by your health care provider.  Keep all follow-up visits as told by your health care provider. This is important. Contact a health care provider if:  You feel  like your heart is beating too quickly or your pulse is not regular.  You have a serious muscle cramp that does not go away. Get help right away if:  You have discomfort in your chest.  You are dizzy or you feel faint.  You have trouble breathing or you are short of breath.  Your speech is slurred.  You have trouble moving an arm or leg on one side of your body.  Your fingers or toes turn cold or blue. Summary  Electrical cardioversion is the delivery of a jolt of electricity to restore a normal rhythm to the heart.  This procedure may be done right away in an emergency or may be a scheduled procedure if the condition is not an emergency.  Generally, this is a safe procedure.  After the procedure, check your pulse often as told by your health care provider. This information is not intended to replace advice given to you by your health care provider. Make sure you discuss any questions you have  with your health care provider. Document Revised: 03/29/2019 Document Reviewed: 03/29/2019 Elsevier Patient Education  2020 [employer].

## 2020-09-13 NOTE — Patient Instructions (Signed)
-   continue dosage of warfarin 1.5 tablets every day EXCEPT 1 tablets on MONDAYS, WEDNESDAYS & FRIDAYS - Recheck INR in 6 weeks. 

## 2020-09-14 ENCOUNTER — Other Ambulatory Visit
Admission: RE | Admit: 2020-09-14 | Discharge: 2020-09-14 | Disposition: A | Discharge status: Home / Self Care | Payer: Medicare Other | Source: Ambulatory Visit | Attending: Cardiology | Admitting: Cardiology

## 2020-09-14 DIAGNOSIS — Z20822 Contact with and (suspected) exposure to covid-19: Secondary | ICD-10-CM | POA: Insufficient documentation

## 2020-09-14 DIAGNOSIS — Z01812 Encounter for preprocedural laboratory examination: Secondary | ICD-10-CM | POA: Insufficient documentation

## 2020-09-14 LAB — CBC WITH DIFFERENTIAL/PLATELET
Basophils Absolute: 0 10*3/uL (ref 0.0–0.2)
Basos: 0 %
EOS (ABSOLUTE): 0 10*3/uL (ref 0.0–0.4)
Eos: 0 %
Hematocrit: 35.4 % (ref 34.0–46.6)
Hemoglobin: 12.2 g/dL (ref 11.1–15.9)
Immature Grans (Abs): 0 10*3/uL (ref 0.0–0.1)
Immature Granulocytes: 0 %
Lymphocytes Absolute: 0.9 10*3/uL (ref 0.7–3.1)
Lymphs: 12 %
MCH: 33.7 pg — ABNORMAL HIGH (ref 26.6–33.0)
MCHC: 34.5 g/dL (ref 31.5–35.7)
MCV: 98 fL — ABNORMAL HIGH (ref 79–97)
Monocytes Absolute: 0.7 10*3/uL (ref 0.1–0.9)
Monocytes: 9 %
Neutrophils Absolute: 5.8 10*3/uL (ref 1.4–7.0)
Neutrophils: 79 %
Platelets: 154 10*3/uL (ref 150–450)
RBC: 3.62 x10E6/uL — ABNORMAL LOW (ref 3.77–5.28)
RDW: 12.5 % (ref 11.7–15.4)
WBC: 7.4 10*3/uL (ref 3.4–10.8)

## 2020-09-14 LAB — SARS CORONAVIRUS 2 (TAT 6-24 HRS): SARS Coronavirus 2: NEGATIVE

## 2020-09-14 LAB — BASIC METABOLIC PANEL
BUN/Creatinine Ratio: 19 (ref 12–28)
BUN: 16 mg/dL (ref 8–27)
CO2: 34 mmol/L — ABNORMAL HIGH (ref 20–29)
Calcium: 9.3 mg/dL (ref 8.7–10.3)
Chloride: 99 mmol/L (ref 96–106)
Creatinine, Ser: 0.83 mg/dL (ref 0.57–1.00)
GFR calc Af Amer: 75 mL/min/{1.73_m2} (ref >59)
GFR calc non Af Amer: 65 mL/min/{1.73_m2} (ref >59)
Glucose: 78 mg/dL (ref 65–99)
Potassium: 3.9 mmol/L (ref 3.5–5.2)
Sodium: 146 mmol/L — ABNORMAL HIGH (ref 134–144)

## 2020-09-17 ENCOUNTER — Other Ambulatory Visit: Payer: Self-pay | Admitting: Cardiovascular Disease

## 2020-09-18 ENCOUNTER — Ambulatory Visit: Payer: Medicare Other | Admitting: Certified Registered"

## 2020-09-18 ENCOUNTER — Ambulatory Visit
Admission: RE | Admit: 2020-09-18 | Discharge: 2020-09-18 | Disposition: A | Discharge status: Home / Self Care | Payer: Medicare Other | Attending: Cardiovascular Disease | Admitting: Cardiovascular Disease

## 2020-09-18 ENCOUNTER — Encounter: Payer: Self-pay | Admitting: Cardiovascular Disease

## 2020-09-18 ENCOUNTER — Other Ambulatory Visit: Payer: Self-pay

## 2020-09-18 ENCOUNTER — Encounter
Admission: RE | Disposition: A | Discharge status: Home / Self Care | Payer: Medicare Other | Source: Home / Self Care | Attending: Cardiovascular Disease

## 2020-09-18 DIAGNOSIS — Z853 Personal history of malignant neoplasm of breast: Secondary | ICD-10-CM | POA: Diagnosis not present

## 2020-09-18 DIAGNOSIS — I4819 Other persistent atrial fibrillation: Secondary | ICD-10-CM | POA: Insufficient documentation

## 2020-09-18 DIAGNOSIS — Z79899 Other long term (current) drug therapy: Secondary | ICD-10-CM | POA: Insufficient documentation

## 2020-09-18 DIAGNOSIS — I5042 Chronic combined systolic (congestive) and diastolic (congestive) heart failure: Secondary | ICD-10-CM | POA: Diagnosis not present

## 2020-09-18 DIAGNOSIS — I110 Hypertensive heart disease with heart failure: Secondary | ICD-10-CM | POA: Diagnosis not present

## 2020-09-18 DIAGNOSIS — J449 Chronic obstructive pulmonary disease, unspecified: Secondary | ICD-10-CM | POA: Insufficient documentation

## 2020-09-18 DIAGNOSIS — I471 Supraventricular tachycardia: Secondary | ICD-10-CM | POA: Diagnosis not present

## 2020-09-18 DIAGNOSIS — Z9981 Dependence on supplemental oxygen: Secondary | ICD-10-CM | POA: Diagnosis not present

## 2020-09-18 DIAGNOSIS — Z888 Allergy status to other drugs, medicaments and biological substances status: Secondary | ICD-10-CM | POA: Diagnosis not present

## 2020-09-18 DIAGNOSIS — I4510 Unspecified right bundle-branch block: Secondary | ICD-10-CM | POA: Diagnosis not present

## 2020-09-18 DIAGNOSIS — I4891 Unspecified atrial fibrillation: Secondary | ICD-10-CM

## 2020-09-18 DIAGNOSIS — Z7901 Long term (current) use of anticoagulants: Secondary | ICD-10-CM | POA: Insufficient documentation

## 2020-09-18 DIAGNOSIS — I4892 Unspecified atrial flutter: Secondary | ICD-10-CM | POA: Diagnosis not present

## 2020-09-18 DIAGNOSIS — I483 Typical atrial flutter: Secondary | ICD-10-CM | POA: Diagnosis not present

## 2020-09-18 DIAGNOSIS — I480 Paroxysmal atrial fibrillation: Secondary | ICD-10-CM | POA: Diagnosis not present

## 2020-09-18 DIAGNOSIS — Z923 Personal history of irradiation: Secondary | ICD-10-CM | POA: Diagnosis not present

## 2020-09-18 HISTORY — PX: CARDIOVERSION: SHX1299

## 2020-09-18 SURGERY — CARDIOVERSION
Anesthesia: General

## 2020-09-18 MED ORDER — ETOMIDATE 2 MG/ML IV SOLN
INTRAVENOUS | Status: DC | PRN
  Administered 2020-09-18: 8 mg via INTRAVENOUS

## 2020-09-18 MED ORDER — ETOMIDATE 2 MG/ML IV SOLN
INTRAVENOUS | Status: AC
  Filled 2020-09-18: qty 10

## 2020-09-18 NOTE — Transfer of Care (Signed)
Immediate Anesthesia Transfer of Care Note  Patient: [name]  Procedure(s) Performed: CARDIOVERSION (N/A )  Patient Location: PACU and Special recoveries  Anesthesia Type:General  Level of Consciousness: awake, drowsy and patient cooperative  Airway & Oxygen Therapy: Patient Spontanous Breathing and Patient connected to nasal cannula oxygen  Post-op Assessment: Report given to RN and Post -op Vital signs reviewed and stable  Post vital signs: Reviewed and stable  Last Vitals:  Vitals Value Taken Time  BP 172/61 09/18/20 0736  Temp    Pulse 62 09/18/20 0741  Resp 17 09/18/20 0741  SpO2 100 % 09/18/20 0741    Last Pain:  Vitals:   09/18/20 0707  TempSrc: Oral  PainSc: 0-No pain         Complications: No complications documented.

## 2020-09-18 NOTE — Anesthesia Preprocedure Evaluation (Signed)
Anesthesia Evaluation  Patient identified by MRN, date of birth, ID band Patient awake    Reviewed: Allergy & Precautions, NPO status , Patient's Chart, lab work & pertinent test results  History of Anesthesia Complications (+) PONV and history of anesthetic complications  Airway Mallampati: III       Dental   Pulmonary sleep apnea and Oxygen sleep apnea , COPD, former smoker,           Cardiovascular hypertension, Pt. on medications +CHF  (-) Past MI + dysrhythmias Atrial Fibrillation + pacemaker (-) Valvular Problems/Murmurs     Neuro/Psych neg Seizures Anxiety    GI/Hepatic Neg liver ROS, GERD  Medicated and Controlled,  Endo/Other  diabetes, Type 2, Oral Hypoglycemic AgentsHypothyroidism   Renal/GU negative Renal ROS     Musculoskeletal   Abdominal   Peds  Hematology   Anesthesia Other Findings   Reproductive/Obstetrics                             Anesthesia Physical Anesthesia Plan  ASA: III  Anesthesia Plan: General   Post-op Pain Management:    Induction: Intravenous  PONV Risk Score and Plan: 4 or greater and Ondansetron and Dexamethasone  Airway Management Planned:   Additional Equipment:   Intra-op Plan:   Post-operative Plan:   Informed Consent: I have reviewed the patients History and Physical, chart, labs and discussed the procedure including the risks, benefits and alternatives for the proposed anesthesia with the patient or authorized representative who has indicated his/her understanding and acceptance.       Plan Discussed with:   Anesthesia Plan Comments:         Anesthesia Quick Evaluation

## 2020-09-18 NOTE — Anesthesia Procedure Notes (Signed)
Procedure Name: MAC Date/Time: 09/18/2020 7:35 AM Performed by: Jerrye Noble, CRNA Pre-anesthesia Checklist: Patient identified, Emergency Drugs available, Suction available and Patient being monitored Patient Re-evaluated:Patient Re-evaluated prior to induction Oxygen Delivery Method: Nasal cannula

## 2020-09-18 NOTE — Discharge Instructions (Signed)
Moderate Conscious Sedation, Adult, Care After This sheet gives you information about how to care for yourself after your procedure. Your health care provider may also give you more specific instructions. If you have problems or questions, contact your health care provider. What can I expect after the procedure? After the procedure, it is common to have:  Sleepiness for several hours.  Impaired judgment for several hours.  Difficulty with balance.  Vomiting if you eat too soon. Follow these instructions at home: For the time period you were told by your health care provider:  Rest.  Do not participate in activities where you could fall or become injured.  Do not drive or use machinery.  Do not drink alcohol.  Do not take sleeping pills or medicines that cause drowsiness.  Do not make important decisions or sign legal documents.  Do not take care of children on your own.      Eating and drinking  Follow the diet recommended by your health care provider.  Drink enough fluid to keep your urine pale yellow.  If you vomit: ? Drink water, juice, or soup when you can drink without vomiting. ? Make sure you have little or no nausea before eating solid foods.   General instructions  Take over-the-counter and prescription medicines only as told by your health care provider.  Have a responsible adult stay with you for the time you are told. It is important to have someone help care for you until you are awake and alert.  Do not smoke.  Keep all follow-up visits as told by your health care provider. This is important. Contact a health care provider if:  You are still sleepy or having trouble with balance after 24 hours.  You feel light-headed.  You keep feeling nauseous or you keep vomiting.  You develop a rash.  You have a fever.  You have redness or swelling around the IV site. Get help right away if:  You have trouble breathing.  You have new-onset confusion at  home. Summary  After the procedure, it is common to feel sleepy, have impaired judgment, or feel nauseous if you eat too soon.  Rest after you get home. Know the things you should not do after the procedure.  Follow the diet recommended by your health care provider and drink enough fluid to keep your urine pale yellow.  Get help right away if you have trouble breathing or new-onset confusion at home. This information is not intended to replace advice given to you by your health care provider. Make sure you discuss any questions you have with your health care provider. Document Revised: 12/24/2019 Document Reviewed: 07/22/2019 Elsevier Patient Education  2021 [employer] Electrical Cardioversion Electrical cardioversion is the delivery of a jolt of electricity to restore a normal rhythm to the heart. A rhythm that is too fast or is not regular keeps the heart from pumping well. In this procedure, sticky patches or metal paddles are placed on the chest to deliver electricity to the heart from a device. This procedure may be done in an emergency if:  There is low or no blood pressure as a result of the heart rhythm.  Normal rhythm must be restored as fast as possible to protect the brain and heart from further damage.  It may save a life. This may also be a scheduled procedure for irregular or fast heart rhythms that are not immediately life-threatening. Tell a health care provider about:  Any allergies you have.    All medicines you are taking, including vitamins, herbs, eye drops, creams, and over-the-counter medicines.  Any problems you or family members have had with anesthetic medicines.  Any blood disorders you have.  Any surgeries you have had.  Any medical conditions you have.  Whether you are pregnant or may be pregnant. What are the risks? Generally, this is a safe procedure. However, problems may occur, including:  Allergic reactions to medicines.  A blood clot  that breaks free and travels to other parts of your body.  The possible return of an abnormal heart rhythm within hours or days after the procedure.  Your heart stopping (cardiac arrest). This is rare. What happens before the procedure? Medicines  Your health care provider may have you start taking: ? Blood-thinning medicines (anticoagulants) so your blood does not clot as easily. ? Medicines to help stabilize your heart rate and rhythm.  Ask your health care provider about: ? Changing or stopping your regular medicines. This is especially important if you are taking diabetes medicines or blood thinners. ? Taking medicines such as aspirin and ibuprofen. These medicines can thin your blood. Do not take these medicines unless your health care provider tells you to take them. ? Taking over-the-counter medicines, vitamins, herbs, and supplements. General instructions  Follow instructions from your health care provider about eating or drinking restrictions.  Plan to have someone take you home from the hospital or clinic.  If you will be going home right after the procedure, plan to have someone with you for 24 hours.  Ask your health care provider what steps will be taken to help prevent infection. These may include washing your skin with a germ-killing soap. What happens during the procedure?  An IV will be inserted into one of your veins.  Sticky patches (electrodes) or metal paddles may be placed on your chest.  You will be given a medicine to help you relax (sedative).  An electrical shock will be delivered. The procedure may vary among health care providers and hospitals.   What can I expect after the procedure?  Your blood pressure, heart rate, breathing rate, and blood oxygen level will be monitored until you leave the hospital or clinic.  Your heart rhythm will be watched to make sure it does not change.  You may have some redness on the skin where the shocks were  given. Follow these instructions at home:  Do not drive for 24 hours if you were given a sedative during your procedure.  Take over-the-counter and prescription medicines only as told by your health care provider.  Ask your health care provider how to check your pulse. Check it often.  Rest for 48 hours after the procedure or as told by your health care provider.  Avoid or limit your caffeine use as told by your health care provider.  Keep all follow-up visits as told by your health care provider. This is important. Contact a health care provider if:  You feel like your heart is beating too quickly or your pulse is not regular.  You have a serious muscle cramp that does not go away. Get help right away if:  You have discomfort in your chest.  You are dizzy or you feel faint.  You have trouble breathing or you are short of breath.  Your speech is slurred.  You have trouble moving an arm or leg on one side of your body.  Your fingers or toes turn cold or blue. Summary  Electrical cardioversion is   the delivery of a jolt of electricity to restore a normal rhythm to the heart.  This procedure may be done right away in an emergency or may be a scheduled procedure if the condition is not an emergency.  Generally, this is a safe procedure.  After the procedure, check your pulse often as told by your health care provider. This information is not intended to replace advice given to you by your health care provider. Make sure you discuss any questions you have with your health care provider. Document Revised: 03/29/2019 Document Reviewed: 03/29/2019 Elsevier Patient Education  2021 Elsevier Inc.  

## 2020-09-18 NOTE — Interval H&P Note (Signed)
History and Physical Interval Note:  09/18/2020 10:00 AM  [name]  has presented today for surgery, with the diagnosis of Cardioversion   Afib.  The various methods of treatment have been discussed with the patient and family. After consideration of risks, benefits and other options for treatment, the patient has consented to  Procedure(s): CARDIOVERSION (N/A) as a surgical intervention.  The patient's history has been reviewed, patient examined, no change in status, stable for surgery.  I have reviewed the patient's chart and labs.  Questions were answered to the patient's satisfaction.     Ida Rogue

## 2020-09-18 NOTE — H&P (Signed)
H&P Addendum, pre-cardioversion  Patient was seen and evaluated prior to -cardioversion procedure Symptoms, prior testing details again confirmed with the patient Patient examined, no significant change from prior exam Lab work reviewed in detail personally by myself Patient understands risk and benefit of the procedure, willing to proceed  Signed, Tim Anakin Varkey, MD, Ph.D [facility]  

## 2020-09-18 NOTE — CV Procedure (Signed)
Cardioversion procedure note For atrial fibrillation/flutter  Procedure Details:  Consent: Risks of procedure as well as the alternatives and risks of each were explained to the (patient/caregiver). Consent for procedure obtained.  Time Out: Verified patient identification, verified procedure, site/side was marked, verified correct patient position, special equipment/implants available, medications/allergies/relevent history reviewed, required imaging and test results available. Performed  Patient placed on cardiac monitor, pulse oximetry, supplemental oxygen as necessary.  Sedation given: propofol IV, Dr. Ronelle Nigh Pacer pads placed anterior and posterior chest.   Cardioverted 1 time(s).  Cardioverted at  150 J. Synchronized biphasic Converted to NSR   Evaluation: Findings: Post procedure EKG shows: NSR Complications: None Patient did tolerate procedure well.  Time Spent Directly with the Patient:  29 minutes   Esmond Plants, M.D., Ph.D.

## 2020-09-18 NOTE — Anesthesia Postprocedure Evaluation (Signed)
Anesthesia Post Note  Patient: [name]  Procedure(s) Performed: CARDIOVERSION (N/A )  Patient location during evaluation: Specials Recovery Anesthesia Type: General Level of consciousness: awake and alert Pain management: pain level controlled Vital Signs Assessment: post-procedure vital signs reviewed and stable Respiratory status: spontaneous breathing and respiratory function stable Cardiovascular status: stable Anesthetic complications: no   No complications documented.   Last Vitals:  Vitals:   09/18/20 0741 09/18/20 0746  BP:  (!) 146/62  Pulse: 62 67  Resp: 17 (!) 21  Temp:    SpO2: 100% 98%    Last Pain:  Vitals:   09/18/20 0746  TempSrc:   PainSc: 0-No pain                 Brennen Gardiner K

## 2020-09-19 ENCOUNTER — Encounter: Payer: Self-pay | Admitting: Cardiovascular Disease

## 2020-09-21 NOTE — Progress Notes (Signed)
Remote pacemaker transmission.   

## 2020-09-25 ENCOUNTER — Telehealth: Payer: Self-pay | Admitting: Internal Medicine

## 2020-09-25 NOTE — Telephone Encounter (Signed)
Patient calling  Patient would like to reschedule due to weather and declined virtual  Next available is April 5th  Patient would like to know if we can add her on sooner, she has been having trouble with afib Please call to discuss

## 2020-09-25 NOTE — Telephone Encounter (Signed)
Patient scheduled for 10/03/20 at 8:00a

## 2020-09-25 NOTE — Telephone Encounter (Signed)
Noted  

## 2020-09-25 NOTE — Telephone Encounter (Signed)
You can offer her Tuesday 10/03/20 at 8:00 am.

## 2020-09-26 ENCOUNTER — Encounter: Payer: Medicare Other | Admitting: Internal Medicine

## 2020-10-03 ENCOUNTER — Other Ambulatory Visit: Payer: Self-pay

## 2020-10-03 ENCOUNTER — Ambulatory Visit (INDEPENDENT_AMBULATORY_CARE_PROVIDER_SITE_OTHER): Payer: Medicare Other | Admitting: Internal Medicine

## 2020-10-03 ENCOUNTER — Encounter: Payer: Self-pay | Admitting: Internal Medicine

## 2020-10-03 VITALS — BP 124/60 | HR 65 | Ht 61.5 in | Wt 124.2 lb

## 2020-10-03 DIAGNOSIS — Z79899 Other long term (current) drug therapy: Secondary | ICD-10-CM

## 2020-10-03 DIAGNOSIS — I428 Other cardiomyopathies: Secondary | ICD-10-CM

## 2020-10-03 DIAGNOSIS — I4819 Other persistent atrial fibrillation: Secondary | ICD-10-CM

## 2020-10-03 DIAGNOSIS — I442 Atrioventricular block, complete: Secondary | ICD-10-CM | POA: Diagnosis not present

## 2020-10-03 DIAGNOSIS — Z950 Presence of cardiac pacemaker: Secondary | ICD-10-CM

## 2020-10-03 DIAGNOSIS — I5022 Chronic systolic (congestive) heart failure: Secondary | ICD-10-CM

## 2020-10-03 NOTE — Progress Notes (Signed)
See reply Patient Care Team: Ezequiel Kayser, MD as PCP - General (Internal Medicine) Wellington Hampshire, MD as PCP - Cardiology (Cardiology) Deboraha Sprang, MD as PCP - Electrophysiology (Cardiology) Alisa Graff, FNP as Nurse Practitioner (Family Medicine)   HPI  [name] is a 86 y.o. female Seen in follow-up for a pacemaker Medtronic implanted 8/16 [facility])  in the context of multi-valvular surgery bioprosthetic mitral valve replacement, tricuspid valve repair and MAZE operation w postoperative complete heart block; CRT upgrade attempted (SK)  failed  9/20  Persistent atrial fibrillation;  anticoagulated with Coumadin and Rx with amio (started 11/19)  Reverted to AFl, assoc w palps weakness and breathlessness, seen by Dr CL with failed efforts to pace terminate and >> DCCV   The patient denies chest pain.  There have been no syncope.    Does have balance issues in addition to modest Orthostatic lightheadedness   Patient denies symptoms of GI intolerance, sun sensitivity, No cough     DATE TEST EF   5/16 Echo   65 %   12/16 Echo   30-35 %   1/18 Echo  55%   11/19 Echo  20-25%   12/19 Echo  35%   6/20 [facility]  PCWP 23 RA  7/20 Echo  35%            Date Cr K Hgb TSH LFTs  8/17     9.2    5/18 0.8  11.5    5/19 0.8 3.9 13.5 (MCV 103<<89)    11/19 0.8 3.9 9.9 2.168 43-  6/20 0.84 4.9 8.0 2.860 (2/20) 51  11/20 0.74 4.2 11.8 2.119   6/21 0.8 3.8 13.1 1.039   11/21 0.82 4.1 12.25 0.824 14            Hx of recurrent anemia and has had Fe replacement, but not sure if there is a specific diagnosis    Past Medical History:  Diagnosis Date  . Anxiety   . Arthritis    "hands" (11/10/2015)  . Breast cancer ([place]) 2002   right  . CAD s/p CABG x 1    a. 04/2015 LIMA to LAD  . Chronic combined systolic (congestive) and diastolic (congestive) heart failure ([place])    a. 01/2015 Echo: EF 50-55%, Gr2 DD (preop valve surgery); b. 08/2015 Echo: EF 30-35%, diff HK. Gr2  DD; c. 09/2016 Echo: EF 50-55%; d. 07/2018 Echo: EF 20-25%; e. 03/2019 Echo: EF 35-40%, diff HK. Mod red RV fxn, RVSP 34.57mmHg. Mildly dil LA. Nl MV prosthesis. Triv AI.  Marland Kitchen COPD (chronic obstructive pulmonary disease) ([place])   . Coronary artery disease   . GERD (gastroesophageal reflux disease)   . History of colon polyps   . History of mitral valve replacement    a. 04/2015 s/p 27 mm [employer] Mitral bovine bioprosthetic tissue valve  . Hyperlipidemia   . Hypertension   . Hypothyroidism   . 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 04/2015 Complete bilateral atrial lesion set using cryothermy and bipolar radiofrequency ablation with clipping of LA appendage  . Meniere disease   . Meniere's disease   . On home oxygen therapy    "2L at night" (11/10/2015)  . PAF (paroxysmal atrial fibrillation) ([place])   . Personal history of radiation therapy   . Pneumonia ~ 2010  . PONV (postoperative nausea and vomiting)    Pt felt like eardrums were gonna pop  . Post-surgical  complete heart block, symptomatic    a. 04/2015 s/p MDT [id number] Advisa DR MRI DC PPM.  . Presence of permanent cardiac pacemaker   . Pulmonary hypertension ([place])   . Surgical wound, non healing - chest wall 11/10/2015  . Tricuspid Regurgitation s/p Repair    a. 04/2015 s/p 26 mm Edwards mc3 ring annuloplasty  . Wound infection 10/13/2015   Superficial sternal wound infection    Past Surgical History:  Procedure Laterality Date  . APPENDECTOMY    . APPLICATION OF A-CELL OF CHEST/ABDOMEN N/A 11/21/2015   Procedure: APPLICATION OF A-CELL OF CHEST/ABDOMEN;  Surgeon: Loel Lofty Dillingham, DO;  Location: [place];  Service: Plastics;  Laterality: N/A;  . APPLICATION OF A-CELL OF CHEST/ABDOMEN Right 12/21/2015   Procedure: APPLICATION OF A-CELL OF RIGHT CHEST;  Surgeon: Loel Lofty Dillingham, DO;  Location: [place];  Service: Plastics;  Laterality: Right;  . APPLICATION OF A-CELL OF CHEST/ABDOMEN Right 01/11/2016   Procedure: APPLICATION OF A-CELL  OF RIGHT CHEST;  Surgeon: Loel Lofty Dillingham, DO;  Location: [place];  Service: Plastics;  Laterality: Right;  . APPLICATION OF A-CELL OF CHEST/ABDOMEN Right 02/12/2016   Procedure: APPLICATION OF A-CELL TO RIGHT CHEST WOUND;  Surgeon: Loel Lofty Dillingham, DO;  Location: [place];  Service: Plastics;  Laterality: Right;  . APPLICATION OF WOUND VAC N/A 05/09/2015   Procedure: APPLICATION OF WOUND VAC;  Surgeon: Rexene Alberts, MD;  Location: [place];  Service: Thoracic;  Laterality: N/A;  . APPLICATION OF WOUND VAC N/A 11/13/2015   Procedure: APPLICATION OF WOUND VAC;  Surgeon: Rexene Alberts, MD;  Location: [place];  Service: Thoracic;  Laterality: N/A;  . APPLICATION OF WOUND VAC N/A 11/21/2015   Procedure: APPLICATION OF WOUND VAC;  Surgeon: Loel Lofty Dillingham, DO;  Location: [place];  Service: Plastics;  Laterality: N/A;  . APPLICATION OF WOUND VAC Right 12/21/2015   Procedure: APPLICATION OF WOUND VAC to right chest wall ;  Surgeon: Loel Lofty Dillingham, DO;  Location: [place];  Service: Plastics;  Laterality: Right;  . APPLICATION OF WOUND VAC Right 01/11/2016   Procedure: APPLICATION OF WOUND VAC RIGHT CHEST ;  Surgeon: Loel Lofty Dillingham, DO;  Location: [place];  Service: Plastics;  Laterality: Right;  . APPLICATION OF WOUND VAC Right 01/24/2016   Procedure: APPLICATION OF WOUND VAC RIGHT CHEST WALL;  Surgeon: Loel Lofty Dillingham, DO;  Location: [place];  Service: Plastics;  Laterality: Right;  . APPLICATION OF WOUND VAC Right 02/12/2016   Procedure: RE-APPLICATION OF WOUND VAC TO RIGHT CHEST WOUND;  Surgeon: Loel Lofty Dillingham, DO;  Location: [place];  Service: Plastics;  Laterality: Right;  . BIV UPGRADE N/A 06/07/2019   Procedure: BIV UPGRADE;  Surgeon: Deboraha Sprang, MD;  Location: [place] [facility];  Service: Cardiovascular;  Laterality: N/A;  . BREAST BIOPSY Left 11/25/06   neg  . BREAST BIOPSY Left 01/20/12   /clip-neg  . CARDIAC CATHETERIZATION  11/2013   [facility]  . CARDIAC CATHETERIZATION  10/2014   [facility]   . CARDIAC VALVE REPLACEMENT    . CARDIOVERSION N/A 07/20/2018   Procedure: CARDIOVERSION;  Surgeon: Wellington Hampshire, MD;  Location: [facility];  Service: Cardiovascular;  Laterality: N/A;  . CARDIOVERSION N/A 09/18/2020   Procedure: CARDIOVERSION;  Surgeon: Minna Merritts, MD;  Location: [facility];  Service: Cardiovascular;  Laterality: N/A;  . CATARACT EXTRACTION W/ INTRAOCULAR LENS  IMPLANT, BILATERAL Bilateral 2014  . CLIPPING OF ATRIAL APPENDAGE N/A 04/25/2015   Procedure:  CLIPPING OF ATRIAL APPENDAGE;  Surgeon: Rexene Alberts, MD;  Location: [place];  Service: Open Heart Surgery;  Laterality: N/A;  . COCHLEAR IMPLANT Left 2005?  . COLONOSCOPY WITH PROPOFOL N/A 01/20/2018   Procedure: COLONOSCOPY WITH PROPOFOL;  Surgeon: Toledo, Benay Pike, MD;  Location: [facility];  Service: Gastroenterology;  Laterality: N/A;  . CORONARY ANGIOPLASTY    . CORONARY ARTERY BYPASS GRAFT N/A 04/25/2015   Procedure: CORONARY ARTERY BYPASS GRAFTING (CABG) x ONE, using left internal mammary artery;  Surgeon: Rexene Alberts, MD;  Location: [place];  Service: Open Heart Surgery;  Laterality: N/A;  . DILATION AND CURETTAGE OF UTERUS  "several before hysterectomy"  . EP IMPLANTABLE DEVICE N/A 05/02/2015   Procedure: Pacemaker Implant;  Surgeon: Thompson Grayer, MD;  Location: [place] [facility];  Service: Cardiovascular;  Laterality: N/A;  . ESOPHAGOGASTRODUODENOSCOPY (EGD) WITH PROPOFOL N/A 01/20/2018   Procedure: ESOPHAGOGASTRODUODENOSCOPY (EGD) WITH PROPOFOL;  Surgeon: Toledo, Benay Pike, MD;  Location: [facility];  Service: Gastroenterology;  Laterality: N/A;  . EYE SURGERY    . I & D EXTREMITY Right 12/06/2015   Procedure: IRRIGATION AND DEBRIDEMENT RIGHT CHEST WALL WITH ACELL PLACEMENT AND VAC;  Surgeon: Loel Lofty Dillingham, DO;  Location: [place];  Service: Plastics;  Laterality: Right;  . INCISION AND DRAINAGE OF WOUND N/A 11/21/2015   Procedure: IRRIGATION AND DEBRIDEMENT WOUND;  Surgeon: Loel Lofty Dillingham,  DO;  Location: [place];  Service: Plastics;  Laterality: N/A;  . INCISION AND DRAINAGE OF WOUND Right 12/21/2015   Procedure: IRRIGATION AND DEBRIDEMENT right chest wall WOUND;  Surgeon: Loel Lofty Dillingham, DO;  Location: [place];  Service: Plastics;  Laterality: Right;  right chest wall   . INCISION AND DRAINAGE OF WOUND Right 01/24/2016   Procedure: IRRIGATION AND DEBRIDEMENT RIGHT CHEST WALL WOUND;  Surgeon: Loel Lofty Dillingham, DO;  Location: [place];  Service: Plastics;  Laterality: Right;  . INCISION AND DRAINAGE OF WOUND Right 03/28/2016   Procedure: IRRIGATION AND DEBRIDEMENT RIGHT CHEST WALL WOUND WITH A Cell Placement;  Surgeon: Loel Lofty Dillingham, DO;  Location: [place];  Service: Plastics;  Laterality: Right;  . INCISION AND DRAINAGE OF WOUND Right 05/17/2016   Procedure: IRRIGATION AND DEBRIDEMENT OF RIGHT CHEST WOUND WITH A CELL PLACEMENT;  Surgeon: Loel Lofty Dillingham, DO;  Location: [place];  Service: Plastics;  Laterality: Right;  . INSERT / REPLACE / REMOVE PACEMAKER    . IRRIGATION AND DEBRIDEMENT OF WOUND WITH SPLIT THICKNESS SKIN GRAFT Right 01/11/2016   Procedure: IRRIGATION AND DEBRIDEMENT OF RIGHT CHEST WOUND ;  Surgeon: Loel Lofty Dillingham, DO;  Location: [place];  Service: Plastics;  Laterality: Right;  . MASTECTOMY, PARTIAL Right 2002   positive, partial  . MAZE N/A 04/25/2015   Procedure: MAZE;  Surgeon: Rexene Alberts, MD;  Location: [place];  Service: Open Heart Surgery;  Laterality: N/A;  . MITRAL VALVE REPAIR N/A 04/25/2015   Procedure: MITRAL VALVE  REPLACEMENT using a 27 mm Edwards Perimount Magna Mitral Ease Valve;  Surgeon: Rexene Alberts, MD;  Location: [place];  Service: Open Heart Surgery;  Laterality: N/A;  . RIGHT HEART CATH N/A 02/08/2019   Procedure: RIGHT HEART CATH;  Surgeon: Wellington Hampshire, MD;  Location: [place] [facility];  Service: Cardiovascular;  Laterality: N/A;  . SKIN SPLIT GRAFT Right 02/12/2016   Procedure: IRRIGATION AND DEBRIDEMENT RIGHT CHEST WOUND;   Surgeon: Loel Lofty Dillingham, DO;  Location: [place];  Service: Plastics;  Laterality: Right;  .  STERNAL WIRES REMOVAL N/A 06/05/2015   Procedure: STERNAL WIRES REMOVAL;  Surgeon: Rexene Alberts, MD;  Location: [name];  Service: Thoracic;  Laterality: N/A;  . STERNAL WOUND DEBRIDEMENT N/A 05/09/2015   Procedure: STERNAL WOUND DEBRIDEMENT;  Surgeon: Rexene Alberts, MD;  Location: [place];  Service: Thoracic;  Laterality: N/A;  . STERNAL WOUND DEBRIDEMENT N/A 11/13/2015   Procedure: Excisional drainage of RIGHT Chest wall mass and breast mass ;  Surgeon: Rexene Alberts, MD;  Location: [place];  Service: Thoracic;  Laterality: N/A;  . TEE WITHOUT CARDIOVERSION N/A 04/25/2015   Procedure: TRANSESOPHAGEAL ECHOCARDIOGRAM (TEE);  Surgeon: Rexene Alberts, MD;  Location: [place];  Service: Open Heart Surgery;  Laterality: N/A;  . TONSILLECTOMY    . TRAM Right 11/18/2015   Procedure: VRAM Vertical Rectus Abdominus Muscle Flap;  Surgeon: Loel Lofty Dillingham, DO;  Location: [place];  Service: Plastics;  Laterality: Right;  RIght Back  . TRICUSPID VALVE REPLACEMENT N/A 04/25/2015   Procedure: TRICUSPID VALVE REPAIR;  Surgeon: Rexene Alberts, MD;  Location: [place];  Service: Open Heart Surgery;  Laterality: N/A;  . VAGINAL HYSTERECTOMY      Current Outpatient Medications  Medication Sig Dispense Refill  . acetaminophen (TYLENOL) 500 MG tablet Take 1,000 mg by mouth every 6 (six) hours as needed (for pain.).    Marland Kitchen AMBULATORY NON FORMULARY MEDICATION Inogen at Home Oxygen machine with tubing. DX: COPD DX Code: J44.9 1 each 0  . amiodarone (PACERONE) 200 MG tablet Take 1 tablet (200 mg total) by mouth daily. 90 tablet 3  . bisoprolol (ZEBETA) 5 MG tablet Take 2.5 mg by mouth daily.    . diphenhydrAMINE (BENADRYL) 25 mg capsule Take 25 mg by mouth every 6 (six) hours as needed for allergies.    Marland Kitchen docusate sodium (COLACE) 100 MG capsule Take 100 mg by mouth 2 (two) times daily as needed (constipation.).    Marland Kitchen furosemide  (LASIX) 20 MG tablet Take 20-40 mg by mouth See admin instructions. Take 2 tablets (40 mg) by mouth in the morning & take 1 tablet (20 mg) by mouth in the afternoon.    . gabapentin (NEURONTIN) 100 MG capsule Take 200 mg by mouth at bedtime.   1  . ipratropium-albuterol (DUONEB) 0.5-2.5 (3) MG/3ML SOLN Take 3 mLs by nebulization every 6 (six) hours as needed. DX:J44.9 (Patient taking differently: Take 3 mLs by nebulization every 6 (six) hours as needed (shortness of breath). DX:J44.9) 1080 mL 1  . levothyroxine (SYNTHROID, LEVOTHROID) 88 MCG tablet Take 88 mcg by mouth daily before breakfast.    . Multiple Vitamins-Minerals (PRESERVISION AREDS 2 PO) Take 1 tablet by mouth 2 (two) times daily.     . OXYGEN Inhale 2 L into the lungs continuous.    . pantoprazole (PROTONIX) 40 MG tablet Take 40 mg by mouth 2 (two) times daily.     . sacubitril-valsartan (ENTRESTO) 49-51 MG TAKE 1 TABLET (49/51 MG) BY MOUTH TWICE DAILY *DOSE INCREASE* (Patient taking differently: Take 1 tablet by mouth 2 (two) times daily.) 60 tablet 5  . simvastatin (ZOCOR) 10 MG tablet Take 1 tablet (10 mg total) by mouth at bedtime. 90 tablet 3  . spironolactone (ALDACTONE) 25 MG tablet Take 1 tablet (25 mg total) by mouth daily. 90 tablet 1  . warfarin (COUMADIN) 2.5 MG tablet TAKE AS DIRECTED PER COUMADIN CLINIC (Patient taking differently: Take 2.5-3.75 mg by mouth See admin instructions. Take 2.5mg   mon. Wed and Fridday All  the other days take 3.75 mg all the other day) 140 tablet 0   No current facility-administered medications for this visit.    Allergies  Allergen Reactions  . Ace Inhibitors Cough and Other (See Comments)  . Amoxicillin-Pot Clavulanate Swelling and Other (See Comments)    SWOLLEN JOINTS  Has patient had a PCN reaction causing immediate rash, facial/tongue/throat swelling, SOB or lightheadedness with hypotension: Yes Has patient had a PCN reaction causing severe rash involving mucus membranes or skin  necrosis: No Has patient had a PCN reaction that required hospitalization No Has patient had a PCN reaction occurring within the last 10 years: No If all of the above answers are "NO", then may proceed with Cephalosporin  . Penicillins Swelling and Other (See Comments)    SWOLLEN JOINTS  Has patient had a PCN reaction causing immediate rash, facial/tongue/throat swelling, SOB or lightheadedness with hypotension: Yes Has patient had a PCN reaction causing severe rash involving mucus membranes or skin necrosis: No Has patient had a PCN reaction that required hospitalization No Has patient had a PCN reaction occurring within the last 10 years: No If all of the above answers are "NO", then may proceed with Cephalosporin use.  Swollen joints  . Rosuvastatin Other (See Comments)  . Nifedipine Other (See Comments)    UNSPECIFIED UNSPECIFIED   . Propranolol Other (See Comments)    UNSPECIFIED UNSPECIFIED   . Diazepam Other (See Comments)    Made her "hyper"   . Meperidine Other (See Comments)    Made her "hyper"    . Propoxyphene Other (See Comments)    Made her "hyper"        Review of Systems negative except from HPI and PMH  Physical Exam BP 124/60 (BP Location: Left Arm, Patient Position: Sitting, Cuff Size: Normal)   Pulse 65   Ht 5' 1.5" (1.562 m)   Wt 124 lb 4 oz (56.4 kg)   SpO2 96% Comment: oxygen 2 Liters  BMI 23.10 kg/m   Well developed and well nourished in no acute distress wearing O2 HENT normal Neck supple  Clear Device pocket well healed; without hematoma or erythema.  There is no tethering  Regular rate and rhythm, no  gallop No murmur Abd-soft with active BS No Clubbing cyanosis  Edema Skin-warm and dry A & Oriented  Grossly normal sensory and motor function Tremor intentional R hand ECG A pacing @ 65 40/12/45   Assessment and  Plan  Atrial fibrillation-persistent status post maze operation  REcurrent  High Risk Medication Surveillance  Amiodarone   Complete heart block -Intermittent  Sinus node dysfunction   First-degree AV block profound--- right bundle branch block  Status post mitral valve replacement-bioprosthetic with tricuspid valve repair  Pacemaker-Medtronic The patient's device was interrogated.  The information was reviewed. No changes were made in the programming.     Dyspnea on Exertion   Cardiomyopathy   Anemia-resolved  Tremor and gait instability  Sinus restored following DCCV  Has had recurrences over the last year and anticipate will recur again-- I have asked her to let us know if it persists for more than 48-72 hours.  If DCCV become frequent, would add ranolazine.  Some concerns already about amio neurotoxicity with tremor and gait instability, but according to her and her husband it remains stable so will follow for now  On Anticoagulation;  No bleeding issues   Dyspnea is back to her baseline, and no evidence of volume overload  Mild orthostasis so will  not increase bisoprolol.  I have advised that with recurrent atrial arrhythmia to increase bisoprolol 5 daily

## 2020-10-03 NOTE — Patient Instructions (Signed)

## 2020-10-09 ENCOUNTER — Other Ambulatory Visit: Payer: Self-pay | Admitting: Cardiovascular Disease

## 2020-10-09 NOTE — Telephone Encounter (Signed)
Rx request sent to pharmacy.  

## 2020-10-16 ENCOUNTER — Other Ambulatory Visit: Payer: Self-pay | Admitting: Internal Medicine

## 2020-10-25 ENCOUNTER — Ambulatory Visit (INDEPENDENT_AMBULATORY_CARE_PROVIDER_SITE_OTHER): Payer: Medicare Other

## 2020-10-25 ENCOUNTER — Other Ambulatory Visit: Payer: Self-pay

## 2020-10-25 DIAGNOSIS — Z953 Presence of xenogenic heart valve: Secondary | ICD-10-CM | POA: Diagnosis not present

## 2020-10-25 DIAGNOSIS — I340 Nonrheumatic mitral (valve) insufficiency: Secondary | ICD-10-CM | POA: Diagnosis not present

## 2020-10-25 DIAGNOSIS — Z5181 Encounter for therapeutic drug level monitoring: Secondary | ICD-10-CM

## 2020-10-25 LAB — POCT INR: INR: 2.5 (ref 2.0–3.0)

## 2020-10-25 NOTE — Patient Instructions (Signed)
-   continue dosage of warfarin 1.5 tablets every day EXCEPT 1 tablets on MONDAYS, WEDNESDAYS & FRIDAYS - Recheck INR in 6 weeks. 

## 2020-11-20 ENCOUNTER — Telehealth: Payer: Self-pay | Admitting: Internal Medicine

## 2020-11-20 ENCOUNTER — Encounter: Payer: Self-pay | Admitting: Cardiovascular Disease

## 2020-11-20 NOTE — Telephone Encounter (Signed)
Reclast is NOT contraindicated on patient with Atrial Fibrillation.  Okay to use as prescribed.

## 2020-11-20 NOTE — Telephone Encounter (Signed)
To pharmacy to please review regarding Reclast.

## 2020-11-20 NOTE — Telephone Encounter (Signed)
I spoke with the patient regarding Pharm D review of Reclast and that per Pharmacy team, this medication is not contraindicated in patient's with atrial fibrillation, so it is ok for her to use this as prescribed.  The patient voices understanding and was very appreciative for the call back.

## 2020-11-20 NOTE — Telephone Encounter (Signed)
Patient calling  Patient has been diagnosed with osteoporsis and they want her to start Reclast infusion She is concerned to start treatment because one of the side effects is irregular heart beat which is something she suffers from Would like to know our thoughts on if it would be a good idea to start  Please call to discuss

## 2020-11-20 NOTE — Telephone Encounter (Signed)
error 

## 2020-12-06 ENCOUNTER — Other Ambulatory Visit: Payer: Self-pay

## 2020-12-06 ENCOUNTER — Ambulatory Visit (INDEPENDENT_AMBULATORY_CARE_PROVIDER_SITE_OTHER): Payer: Medicare Other

## 2020-12-06 DIAGNOSIS — Z953 Presence of xenogenic heart valve: Secondary | ICD-10-CM | POA: Diagnosis not present

## 2020-12-06 DIAGNOSIS — Z5181 Encounter for therapeutic drug level monitoring: Secondary | ICD-10-CM

## 2020-12-06 DIAGNOSIS — I340 Nonrheumatic mitral (valve) insufficiency: Secondary | ICD-10-CM

## 2020-12-06 LAB — POCT INR: INR: 2.7 (ref 2.0–3.0)

## 2020-12-06 NOTE — Patient Instructions (Signed)
-   continue dosage of warfarin 1.5 tablets every day EXCEPT 1 tablets on MONDAYS, WEDNESDAYS & FRIDAYS - Recheck INR in 6 weeks.

## 2020-12-07 ENCOUNTER — Ambulatory Visit (INDEPENDENT_AMBULATORY_CARE_PROVIDER_SITE_OTHER): Payer: Medicare Other

## 2020-12-07 DIAGNOSIS — I442 Atrioventricular block, complete: Secondary | ICD-10-CM | POA: Diagnosis not present

## 2020-12-07 LAB — CUP PACEART REMOTE DEVICE CHECK
Battery Remaining Longevity: 45 mo
Battery Voltage: 2.98 V
Brady Statistic AP VP Percent: 3.26 %
Brady Statistic AP VS Percent: 91.11 %
Brady Statistic AS VP Percent: 2.97 %
Brady Statistic AS VS Percent: 2.66 %
Brady Statistic RA Percent Paced: 91.14 %
Brady Statistic RV Percent Paced: 5.48 %
Date Time Interrogation Session: 20220331073822
Implantable Lead Implant Date: 20160823
Implantable Lead Implant Date: 20160823
Implantable Lead Location: 753859
Implantable Lead Location: 753860
Implantable Lead Model: 5076
Implantable Lead Model: 5076
Implantable Pulse Generator Implant Date: 20160823
Lead Channel Impedance Value: 361 Ohm
Lead Channel Impedance Value: 380 Ohm
Lead Channel Impedance Value: 437 Ohm
Lead Channel Impedance Value: 456 Ohm
Lead Channel Pacing Threshold Amplitude: 0.375 V
Lead Channel Pacing Threshold Amplitude: 0.625 V
Lead Channel Pacing Threshold Pulse Width: 0.4 ms
Lead Channel Pacing Threshold Pulse Width: 0.4 ms
Lead Channel Sensing Intrinsic Amplitude: 1.625 mV
Lead Channel Sensing Intrinsic Amplitude: 1.625 mV
Lead Channel Sensing Intrinsic Amplitude: 8.375 mV
Lead Channel Sensing Intrinsic Amplitude: 8.375 mV
Lead Channel Setting Pacing Amplitude: 2 V
Lead Channel Setting Pacing Amplitude: 2.5 V
Lead Channel Setting Pacing Pulse Width: 0.4 ms
Lead Channel Setting Sensing Sensitivity: 2.8 mV

## 2020-12-13 ENCOUNTER — Encounter: Payer: Self-pay | Admitting: Internal Medicine

## 2020-12-13 ENCOUNTER — Ambulatory Visit (INDEPENDENT_AMBULATORY_CARE_PROVIDER_SITE_OTHER): Payer: Medicare Other | Admitting: Internal Medicine

## 2020-12-13 ENCOUNTER — Other Ambulatory Visit: Payer: Self-pay

## 2020-12-13 VITALS — BP 130/74 | HR 82 | Temp 98.4°F | Ht 61.5 in | Wt 126.4 lb

## 2020-12-13 DIAGNOSIS — J9611 Chronic respiratory failure with hypoxia: Secondary | ICD-10-CM | POA: Diagnosis not present

## 2020-12-13 DIAGNOSIS — R0689 Other abnormalities of breathing: Secondary | ICD-10-CM

## 2020-12-13 DIAGNOSIS — J449 Chronic obstructive pulmonary disease, unspecified: Secondary | ICD-10-CM

## 2020-12-13 NOTE — Patient Instructions (Signed)
Continue nebulizers as prescribed Continue oxygen as prescribed Follow-up with cardiology as scheduled Continue amiodarone as prescribed No indication for CT at this time

## 2020-12-13 NOTE — Progress Notes (Signed)
PULMONARY OFFICE FOLLOW UP NOTE  Date of initial consultation: 07/27/15 Reason for consultation: COPD  PT PROFILE:  86 y.o. F former smoker (up to 1.5 PPD, quit 1999) initially seen in 2016 for COPD. Previously followed by Dr Raul Del. She was first diagnosed with COPD in 2012 at the time of a pneumonia.   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 PFTs 03/17/15: Spirometry reveals mild/moderate obstruction (FEV1 67% predicted, FEV1/FVC 62%).  Lung volumes normal.  Diffusion capacity markedly reduced at 35% predicted.  DLCO/VA 54% predicted Echocardiogram 08/22/15: LVEF 30-35%. Diffuse HK. prosthetic valves functioning well ONO on RA 02/22/16: significant desaturation to low of 78%. Total desaturation events: 170 Echocardiogram 09/17/16: LVEF 50-55%.  LA mildly dilated.  RVSP est normal Echocardiogram 07/17/18: LVEF 20-25%.  Diffuse hypokinesis.  Prosthetic mitral valve functioning normally.  LA mildly dilated.  RV moderately dilated.  RV systolic function moderately reduced.  PA systolic pressure could not be accurately estimated Echocardiogram 08/24/18: LVEF 35-40%.  Mild concentric hypertrophy.  Diffuse hypokinesis.  Bioprosthetic mitral valve functioning normally.  LA mildly dilated.  RV cavity mildly dilated.  RVSP could not be accurately estimated. Echocardiogram 10/26/18: LVEF 35-40%.  LA moderately dilated.  Bioprosthetic mitral valve present.  RV systolic function mildly reduced.  RV cavity moderately enlarged.  RA moderately dilated.  RVSP est 26 mmHg R heart cath 02/08/19: Mean PCWP 23 mmHg. Large V wave c/w MR.  Echocardiogram 04/01/19: LVEF 35-40%.  Moderate LVH.  LA mildly dilated.  Bioprosthetic mitral valve present.  RV mildly dilated with moderately reduced systolic function.  RVSP 35 mmHg. PAP 69/24 mmHg  PROBLEMS: Mild COPD CHF S/P Maze procedure, CABG with  chronic sternal wound infection   CC Follow up COPD Follow-up hypoxic respiratory failure  HPI Previous history of COVID-19 infection June 2020 Chronic shortness of breath and dyspnea exertion Seems to be stable at this time  No exacerbation at this time No evidence of heart failure at this time No evidence or signs of infection at this time No respiratory distress No fevers, chills, nausea, vomiting, diarrhea No evidence of lower extremity edema No evidence hemoptysis  A. fib seems to be well-controlled at this time on amiodarone therapy She takes 100 mg daily  Patient uses and benefits from oxygen therapy she needs this for survival   Review of Systems:  Gen:  Denies  fever, sweats, chills weight loss  HEENT: Denies blurred vision, double vision, ear pain, eye pain, hearing loss, nose bleeds, sore throat Cardiac:  No dizziness, chest pain or heaviness, chest tightness,edema, No JVD Resp:   No cough, -sputum production, +shortness of breath,-wheezing, -hemoptysis,  Gi: Denies swallowing difficulty, stomach pain, nausea or vomiting, diarrhea, constipation, bowel incontinence Gu:  Denies bladder incontinence, burning urine Ext:   Denies Joint pain, stiffness or swelling Skin: Denies  skin rash, easy bruising or bleeding or hives Endoc:  Denies polyuria, polydipsia , polyphagia or weight change Psych:   Denies depression, insomnia or hallucinations  Other:  All other systems negative    BP 130/74 (BP Location: Left Arm, Cuff Size: Normal)   Pulse 82   Temp 98.4 F (36.9 C) (Temporal)   Ht 5' 1.5" (1.562 m)   Wt 126 lb 6.4 oz (57.3 kg)   SpO2 95%   BMI 23.50 kg/m     Physical Examination:   General Appearance: No distress  Neuro:without focal findings,  speech normal,  HEENT: PERRLA, EOM intact.   Pulmonary: normal breath sounds, No wheezing.  CardiovascularNormal S1,S2.  No m/r/g.   Abdomen: Benign, Soft, non-tender. Renal:  No costovertebral tenderness   GU:  Not performed at this time. Endoc: No evident thyromegaly Skin:   warm, no rashes, no ecchymosis  Extremities: normal, no cyanosis, clubbing. PSYCHIATRIC: Mood, affect within normal limits.   ALL OTHER ROS ARE NEGATIVE       BMP Latest Ref Rng & Units 09/13/2020 07/20/2020 12/01/2019  Glucose 65 - 99 mg/dL 78 111(H) 152(H)  BUN 8 - 27 mg/dL 16 14 21   Creatinine 0.57 - 1.00 mg/dL 0.83 0.82 0.91  BUN/Creat Ratio 12 - 28 19 17  -  Sodium 134 - 144 mmol/L 146(H) 143 145  Potassium 3.5 - 5.2 mmol/L 3.9 4.1 3.6  Chloride 96 - 106 mmol/L 99 97 101  CO2 20 - 29 mmol/L 34(H) 32(H) 35(H)  Calcium 8.7 - 10.3 mg/dL 9.3 9.5 9.4    CBC Latest Ref Rng & Units 09/13/2020 06/03/2019 03/16/2019  WBC 3.4 - 10.8 x10E3/uL 7.4 6.0 6.6  Hemoglobin 11.1 - 15.9 g/dL 12.2 11.4 7.2(L)  Hematocrit 34.0 - 46.6 % 35.4 35.2 21.0(L)  Platelets 150 - 450 x10E3/uL 154 156 207   CXR 02/22/19: Status post sternotomy.  AICD in place.  Cardiomegaly.  Patchy bilateral infiltrates.   ASSESSMENT PLAN  86 year old white female with history of COPD with history of CAD with AICD with previous infection of COVID-19 pneumonia 2 years ago with chronic hypoxic respiratory failure  COPD Severe Gold stage D Chronic shortness of breath and dyspnea on exertion Continue nebulized bronchodilator therapy No indication for prednisone or antibiotics at this time Pulmonary function testing would not be feasible in the setting of chronic respiratory insufficiency  Chronic hypoxic respiratory failure  Oxygen as prescribed Patient is a benefits from oxygen therapy Needs this for survival  Amiodarone therapy for A. 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 and  COPD Continue anticoagulation as prescribed Continue oxygen as prescribed  Chronic respiratory insufficiency Patient is very debilitated with her balance as well as her respiratory status Oxygen therapy is vital and needed this for survival Continue nebulized therapy as prescribed  Chronic heart failure with pulmonary hypertension Follow-up cardiology Continue amiodarone Continue anticoagulation    MEDICATION ADJUSTMENTS/LABS AND TESTS ORDERED: Continue nebulizers as prescribed Continue oxygen as prescribed Follow-up with cardiology as scheduled Continue amiodarone as prescribed No indication for CT at this time   [place]   Patient satisfied with Plan of action and management. All questions answered  Follow up In 1 year   Total Time spent 36 mins  Skyler Dusing Patricia Pesa, M.D.  [facility]  Medical Director [place] Director [facility] Cardio-Pulmonary Department

## 2020-12-20 NOTE — Progress Notes (Signed)
Remote pacemaker transmission.   

## 2020-12-26 ENCOUNTER — Telehealth: Payer: Self-pay | Admitting: Internal Medicine

## 2020-12-26 ENCOUNTER — Other Ambulatory Visit: Payer: Self-pay | Admitting: Internal Medicine

## 2020-12-26 ENCOUNTER — Other Ambulatory Visit: Payer: Self-pay | Admitting: Cardiovascular Disease

## 2020-12-26 MED ORDER — IPRATROPIUM-ALBUTEROL 0.5-2.5 (3) MG/3ML IN SOLN: 3.0000 mL | Freq: Four times a day (QID) | RESPIRATORY_TRACT | 1 refills | Status: DC | PRN

## 2020-12-26 NOTE — Telephone Encounter (Signed)
Refill Request.  

## 2020-12-26 NOTE — Telephone Encounter (Signed)
Spoke to patient, who is requesting refill on duoneb.  Rx has been sent to preferred pharmacy.  Patient reports of mild voice hoariness but denied respiratory sx. Nothing further needed at this time.

## 2021-01-17 ENCOUNTER — Ambulatory Visit (INDEPENDENT_AMBULATORY_CARE_PROVIDER_SITE_OTHER): Payer: Medicare Other

## 2021-01-17 ENCOUNTER — Other Ambulatory Visit: Payer: Self-pay

## 2021-01-17 DIAGNOSIS — Z5181 Encounter for therapeutic drug level monitoring: Secondary | ICD-10-CM | POA: Diagnosis not present

## 2021-01-17 DIAGNOSIS — Z953 Presence of xenogenic heart valve: Secondary | ICD-10-CM

## 2021-01-17 DIAGNOSIS — I340 Nonrheumatic mitral (valve) insufficiency: Secondary | ICD-10-CM | POA: Diagnosis not present

## 2021-01-17 LAB — POCT INR: INR: 5.8 — AB (ref 2.0–3.0)

## 2021-01-17 NOTE — Patient Instructions (Signed)
-   skip warfarin tonight, tomorrow or Friday, then - resume dosage of warfarin 1.5 tablets every day EXCEPT 1 tablets on MONDAYS, WEDNESDAYS & FRIDAYS - Recheck INR in 2 weeks.

## 2021-01-19 ENCOUNTER — Other Ambulatory Visit: Payer: Self-pay

## 2021-01-19 ENCOUNTER — Encounter: Payer: Self-pay | Admitting: Cardiovascular Disease

## 2021-01-19 ENCOUNTER — Ambulatory Visit (INDEPENDENT_AMBULATORY_CARE_PROVIDER_SITE_OTHER): Payer: Medicare Other | Admitting: Cardiovascular Disease

## 2021-01-19 VITALS — BP 120/60 | HR 76 | Ht 62.0 in | Wt 125.1 lb

## 2021-01-19 DIAGNOSIS — I5022 Chronic systolic (congestive) heart failure: Secondary | ICD-10-CM | POA: Diagnosis not present

## 2021-01-19 DIAGNOSIS — E785 Hyperlipidemia, unspecified: Secondary | ICD-10-CM

## 2021-01-19 DIAGNOSIS — Z950 Presence of cardiac pacemaker: Secondary | ICD-10-CM

## 2021-01-19 DIAGNOSIS — I4819 Other persistent atrial fibrillation: Secondary | ICD-10-CM

## 2021-01-19 DIAGNOSIS — Z953 Presence of xenogenic heart valve: Secondary | ICD-10-CM

## 2021-01-19 NOTE — Progress Notes (Signed)
Cardiology Office Note   Date:  01/19/2021   ID:  [name], DOB 10/13/1934, MRN [id number]  PCP:  Ezequiel Kayser, MD  Cardiologist:   Kathlyn Sacramento, MD   Chief Complaint  Patient presents with  . Other    6 month f/u no complaints today. Meds reviewed verbally with pt.      History of Present Illness: [name] is a 86 y.o. female who presents for a follow-up visit regarding chronic systolic heart failure, atrial fibrillation and mitral regurgitation.  She has known history of COPD, breast cancer status post right partial mastectomy, hypothyroidism, hyperlipidemia and Mnire disease.   She underwent mitral valve replacement with a bioprosthetic valve, tricuspid valve repair, one-vessel CABG with LIMA to LAD and maze procedure in August 2016. This was complicated by sternal wound infection which required debridement. She also had complete heart block and underwent permanent pacemaker placement.  In 08/2017, she had worsening heart failure.  Echocardiogram showed an EF of 30 to 35%.She was seen by EP and it was felt that LV dysfunction was in the context of RV apical pacing. That was inactivated and she noted immediate improvement in symptoms. She again had worsening heart failure symptoms in late 2019.  She had a drop in EF to 20 to 25% in the setting of atrial fibrillation with RVR.  She was rate controlled and started on amiodarone and subsequently underwent cardioversion with restoration of sinus rhythm.  EF improved to 35 to 40% after that.   Right heart catheterization was done in June 2020 which showed moderately elevated filling pressures, severe pulmonary hypertension and mildly reduced cardiac output.  There was no evidence of left-to-right intracardiac shunting by saturation run.  Prominent V waves were noted on pulmonary capillary wedge pressure tracing suggestive of significant mitral regurgitation.  Symptoms improved after increasing furosemide.   She underwent  attempted upgrade of her pacemaker to a biventricular device but the procedure was not successful due to inability to place an LV lead.  She had recurrent atrial fibrillation in January that required cardioversion.  She has been in sinus rhythm since then.  She has been doing reasonably well and denies any chest pain.  She does complain of increased fatigue and shortness of breath over the last few weeks.  No palpitations or tachycardia.   Past Medical History:  Diagnosis Date  . Anxiety   . Arthritis    "hands" (11/10/2015)  . Breast cancer ([place]) 2002   right  . CAD s/p CABG x 1    a. 04/2015 LIMA to LAD  . Chronic combined systolic (congestive) and diastolic (congestive) heart failure ([place])    a. 01/2015 Echo: EF 50-55%, Gr2 DD (preop valve surgery); b. 08/2015 Echo: EF 30-35%, diff HK. Gr2 DD; c. 09/2016 Echo: EF 50-55%; d. 07/2018 Echo: EF 20-25%; e. 03/2019 Echo: EF 35-40%, diff HK. Mod red RV fxn, RVSP 34.71mmHg. Mildly dil LA. Nl MV prosthesis. Triv AI.  Marland Kitchen COPD (chronic obstructive pulmonary disease) ([place])   . Coronary artery disease   . GERD (gastroesophageal reflux disease)   . History of colon polyps   . History of mitral valve replacement    a. 04/2015 s/p 27 mm [facility] Mitral bovine bioprosthetic tissue valve  . Hyperlipidemia   . Hypertension   . Hypothyroidism   . 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 04/2015 Complete bilateral atrial lesion set using cryothermy and bipolar radiofrequency ablation with clipping of  LA appendage  . Meniere disease   . Meniere's disease   . On home oxygen therapy    "2L at night" (11/10/2015)  . PAF (paroxysmal atrial fibrillation) ([place])   . Personal history of radiation therapy   . Pneumonia ~ 2010  . PONV (postoperative nausea and vomiting)    Pt felt like eardrums were gonna pop  . Post-surgical complete heart block, symptomatic    a. 04/2015 s/p MDT [id number] Advisa DR MRI DC PPM.  . Presence of permanent cardiac pacemaker   .  Pulmonary hypertension ([place])   . Surgical wound, non healing - chest wall 11/10/2015  . Tricuspid Regurgitation s/p Repair    a. 04/2015 s/p 26 mm Edwards mc3 ring annuloplasty  . Wound infection 10/13/2015   Superficial sternal wound infection    Past Surgical History:  Procedure Laterality Date  . APPENDECTOMY    . APPLICATION OF A-CELL OF CHEST/ABDOMEN N/A 11/21/2015   Procedure: APPLICATION OF A-CELL OF CHEST/ABDOMEN;  Surgeon: Loel Lofty Dillingham, DO;  Location: [place];  Service: Plastics;  Laterality: N/A;  . APPLICATION OF A-CELL OF CHEST/ABDOMEN Right 12/21/2015   Procedure: APPLICATION OF A-CELL OF RIGHT CHEST;  Surgeon: Loel Lofty Dillingham, DO;  Location: [place];  Service: Plastics;  Laterality: Right;  . APPLICATION OF A-CELL OF CHEST/ABDOMEN Right 01/11/2016   Procedure: APPLICATION OF A-CELL OF RIGHT CHEST;  Surgeon: Loel Lofty Dillingham, DO;  Location: [place];  Service: Plastics;  Laterality: Right;  . APPLICATION OF A-CELL OF CHEST/ABDOMEN Right 02/12/2016   Procedure: APPLICATION OF A-CELL TO RIGHT CHEST WOUND;  Surgeon: Loel Lofty Dillingham, DO;  Location: [place];  Service: Plastics;  Laterality: Right;  . APPLICATION OF WOUND VAC N/A 05/09/2015   Procedure: APPLICATION OF WOUND VAC;  Surgeon: Rexene Alberts, MD;  Location: [place];  Service: Thoracic;  Laterality: N/A;  . APPLICATION OF WOUND VAC N/A 11/13/2015   Procedure: APPLICATION OF WOUND VAC;  Surgeon: Rexene Alberts, MD;  Location: [place];  Service: Thoracic;  Laterality: N/A;  . APPLICATION OF WOUND VAC N/A 11/21/2015   Procedure: APPLICATION OF WOUND VAC;  Surgeon: Loel Lofty Dillingham, DO;  Location: [place];  Service: Plastics;  Laterality: N/A;  . APPLICATION OF WOUND VAC Right 12/21/2015   Procedure: APPLICATION OF WOUND VAC to right chest wall ;  Surgeon: Loel Lofty Dillingham, DO;  Location: [place];  Service: Plastics;  Laterality: Right;  . APPLICATION OF WOUND VAC Right 01/11/2016   Procedure: APPLICATION OF WOUND VAC RIGHT CHEST ;   Surgeon: Loel Lofty Dillingham, DO;  Location: [place];  Service: Plastics;  Laterality: Right;  . APPLICATION OF WOUND VAC Right 01/24/2016   Procedure: APPLICATION OF WOUND VAC RIGHT CHEST WALL;  Surgeon: Loel Lofty Dillingham, DO;  Location: [place];  Service: Plastics;  Laterality: Right;  . APPLICATION OF WOUND VAC Right 02/12/2016   Procedure: RE-APPLICATION OF WOUND VAC TO RIGHT CHEST WOUND;  Surgeon: Loel Lofty Dillingham, DO;  Location: [place];  Service: Plastics;  Laterality: Right;  . BIV UPGRADE N/A 06/07/2019   Procedure: BIV UPGRADE;  Surgeon: Deboraha Sprang, MD;  Location: [place] [facility];  Service: Cardiovascular;  Laterality: N/A;  . BREAST BIOPSY Left 11/25/06   neg  . BREAST BIOPSY Left 01/20/12   /clip-neg  . CARDIAC CATHETERIZATION  11/2013   [facility]  . CARDIAC CATHETERIZATION  10/2014   [facility]  . CARDIAC VALVE REPLACEMENT    . CARDIOVERSION N/A 07/20/2018  Procedure: CARDIOVERSION;  Surgeon: Wellington Hampshire, MD;  Location: [facility];  Service: Cardiovascular;  Laterality: N/A;  . CARDIOVERSION N/A 09/18/2020   Procedure: CARDIOVERSION;  Surgeon: Minna Merritts, MD;  Location: [facility];  Service: Cardiovascular;  Laterality: N/A;  . CATARACT EXTRACTION W/ INTRAOCULAR LENS  IMPLANT, BILATERAL Bilateral 2014  . CLIPPING OF ATRIAL APPENDAGE N/A 04/25/2015   Procedure: CLIPPING OF ATRIAL APPENDAGE;  Surgeon: Rexene Alberts, MD;  Location: [place];  Service: Open Heart Surgery;  Laterality: N/A;  . COCHLEAR IMPLANT Left 2005?  . COLONOSCOPY WITH PROPOFOL N/A 01/20/2018   Procedure: COLONOSCOPY WITH PROPOFOL;  Surgeon: Toledo, Benay Pike, MD;  Location: [facility];  Service: Gastroenterology;  Laterality: N/A;  . CORONARY ANGIOPLASTY    . CORONARY ARTERY BYPASS GRAFT N/A 04/25/2015   Procedure: CORONARY ARTERY BYPASS GRAFTING (CABG) x ONE, using left internal mammary artery;  Surgeon: Rexene Alberts, MD;  Location: [place];  Service: Open Heart Surgery;  Laterality: N/A;  . DILATION AND  CURETTAGE OF UTERUS  "several before hysterectomy"  . EP IMPLANTABLE DEVICE N/A 05/02/2015   Procedure: Pacemaker Implant;  Surgeon: Thompson Grayer, MD;  Location: [place] [facility];  Service: Cardiovascular;  Laterality: N/A;  . ESOPHAGOGASTRODUODENOSCOPY (EGD) WITH PROPOFOL N/A 01/20/2018   Procedure: ESOPHAGOGASTRODUODENOSCOPY (EGD) WITH PROPOFOL;  Surgeon: Toledo, Benay Pike, MD;  Location: [facility];  Service: Gastroenterology;  Laterality: N/A;  . EYE SURGERY    . I & D EXTREMITY Right 12/06/2015   Procedure: IRRIGATION AND DEBRIDEMENT RIGHT CHEST WALL WITH ACELL PLACEMENT AND VAC;  Surgeon: Loel Lofty Dillingham, DO;  Location: [place];  Service: Plastics;  Laterality: Right;  . INCISION AND DRAINAGE OF WOUND N/A 11/21/2015   Procedure: IRRIGATION AND DEBRIDEMENT WOUND;  Surgeon: Loel Lofty Dillingham, DO;  Location: [place];  Service: Plastics;  Laterality: N/A;  . INCISION AND DRAINAGE OF WOUND Right 12/21/2015   Procedure: IRRIGATION AND DEBRIDEMENT right chest wall WOUND;  Surgeon: Loel Lofty Dillingham, DO;  Location: [place];  Service: Plastics;  Laterality: Right;  right chest wall   . INCISION AND DRAINAGE OF WOUND Right 01/24/2016   Procedure: IRRIGATION AND DEBRIDEMENT RIGHT CHEST WALL WOUND;  Surgeon: Loel Lofty Dillingham, DO;  Location: [place];  Service: Plastics;  Laterality: Right;  . INCISION AND DRAINAGE OF WOUND Right 03/28/2016   Procedure: IRRIGATION AND DEBRIDEMENT RIGHT CHEST WALL WOUND WITH A Cell Placement;  Surgeon: Loel Lofty Dillingham, DO;  Location: [place];  Service: Plastics;  Laterality: Right;  . INCISION AND DRAINAGE OF WOUND Right 05/17/2016   Procedure: IRRIGATION AND DEBRIDEMENT OF RIGHT CHEST WOUND WITH A CELL PLACEMENT;  Surgeon: Loel Lofty Dillingham, DO;  Location: [place];  Service: Plastics;  Laterality: Right;  . INSERT / REPLACE / REMOVE PACEMAKER    . IRRIGATION AND DEBRIDEMENT OF WOUND WITH SPLIT THICKNESS SKIN GRAFT Right 01/11/2016   Procedure: IRRIGATION AND DEBRIDEMENT  OF RIGHT CHEST WOUND ;  Surgeon: Loel Lofty Dillingham, DO;  Location: [place];  Service: Plastics;  Laterality: Right;  . MASTECTOMY, PARTIAL Right 2002   positive, partial  . MAZE N/A 04/25/2015   Procedure: MAZE;  Surgeon: Rexene Alberts, MD;  Location: [place];  Service: Open Heart Surgery;  Laterality: N/A;  . MITRAL VALVE REPAIR N/A 04/25/2015   Procedure: MITRAL VALVE  REPLACEMENT using a 27 mm Edwards Perimount Magna Mitral Ease Valve;  Surgeon: Rexene Alberts, MD;  Location: [place];  Service: Open Heart Surgery;  Laterality: N/A;  . RIGHT HEART CATH N/A 02/08/2019   Procedure: RIGHT HEART CATH;  Surgeon: Wellington Hampshire, MD;  Location: [place] [facility];  Service: Cardiovascular;  Laterality: N/A;  . SKIN SPLIT GRAFT Right 02/12/2016   Procedure: IRRIGATION AND DEBRIDEMENT RIGHT CHEST WOUND;  Surgeon: Loel Lofty Dillingham, DO;  Location: [place];  Service: Plastics;  Laterality: Right;  . STERNAL WIRES REMOVAL N/A 06/05/2015   Procedure: STERNAL WIRES REMOVAL;  Surgeon: Rexene Alberts, MD;  Location: [place];  Service: Thoracic;  Laterality: N/A;  . STERNAL WOUND DEBRIDEMENT N/A 05/09/2015   Procedure: STERNAL WOUND DEBRIDEMENT;  Surgeon: Rexene Alberts, MD;  Location: [place];  Service: Thoracic;  Laterality: N/A;  . STERNAL WOUND DEBRIDEMENT N/A 11/13/2015   Procedure: Excisional drainage of RIGHT Chest wall mass and breast mass ;  Surgeon: Rexene Alberts, MD;  Location: [place];  Service: Thoracic;  Laterality: N/A;  . TEE WITHOUT CARDIOVERSION N/A 04/25/2015   Procedure: TRANSESOPHAGEAL ECHOCARDIOGRAM (TEE);  Surgeon: Rexene Alberts, MD;  Location: [place];  Service: Open Heart Surgery;  Laterality: N/A;  . TONSILLECTOMY    . TRAM Right 11/18/2015   Procedure: VRAM Vertical Rectus Abdominus Muscle Flap;  Surgeon: Loel Lofty Dillingham, DO;  Location: [place];  Service: Plastics;  Laterality: Right;  RIght Back  . TRICUSPID VALVE REPLACEMENT N/A 04/25/2015   Procedure: TRICUSPID VALVE REPAIR;  Surgeon:  Rexene Alberts, MD;  Location: [place];  Service: Open Heart Surgery;  Laterality: N/A;  . VAGINAL HYSTERECTOMY       Current Outpatient Medications  Medication Sig Dispense Refill  . acetaminophen (TYLENOL) 500 MG tablet Take 1,000 mg by mouth every 6 (six) hours as needed (for pain.).    Marland Kitchen AMBULATORY NON FORMULARY MEDICATION Inogen at Home Oxygen machine with tubing. DX: COPD DX Code: J44.9 1 each 0  . amiodarone (PACERONE) 200 MG tablet Take 1 tablet (200 mg total) by mouth daily. 90 tablet 3  . bisoprolol (ZEBETA) 5 MG tablet Take 2.5 mg by mouth daily.    . diphenhydrAMINE (BENADRYL) 25 mg capsule Take 25 mg by mouth every 6 (six) hours as needed for allergies.    Marland Kitchen docusate sodium (COLACE) 100 MG capsule Take 100 mg by mouth 2 (two) times daily as needed (constipation.).    Marland Kitchen furosemide (LASIX) 20 MG tablet Take 20-40 mg by mouth See admin instructions. Take 2 tablets (40 mg) by mouth in the morning & take 1 tablet (20 mg) by mouth in the afternoon.    . gabapentin (NEURONTIN) 100 MG capsule Take 200 mg by mouth at bedtime.   1  . ipratropium-albuterol (DUONEB) 0.5-2.5 (3) MG/3ML SOLN Inhale 3 mLs into the lungs every 6 (six) hours as needed. 120 mL 11  . levothyroxine (SYNTHROID, LEVOTHROID) 88 MCG tablet Take 88 mcg by mouth daily before breakfast.    . Multiple Vitamins-Minerals (PRESERVISION AREDS 2 PO) Take 1 tablet by mouth 2 (two) times daily.     . OXYGEN Inhale 2 L into the lungs continuous.    . pantoprazole (PROTONIX) 40 MG tablet Take 40 mg by mouth 2 (two) times daily.     . sacubitril-valsartan (ENTRESTO) 49-51 MG TAKE 1 TABLET (49/51 MG) BY MOUTH TWICE DAILY *DOSE INCREASE* 60 tablet 5  . simvastatin (ZOCOR) 10 MG tablet Take 1 tablet (10 mg total) by mouth at bedtime. 90 tablet 3  . spironolactone (ALDACTONE) 25 MG tablet TAKE 1 TABLET BY MOUTH EVERY  DAY *DOSE INCREASE* 90 tablet 1  . warfarin (COUMADIN) 2.5 MG tablet TAKE AS DIRECTED PER COUMADIN CLINIC 140 tablet 0    No current facility-administered medications for this visit.    Allergies:   Ace inhibitors, Amoxicillin-pot clavulanate, Penicillins, Rosuvastatin, Nifedipine, Propranolol, Diazepam, Meperidine, and Propoxyphene    Social History:  The patient  reports that she quit smoking about 22 years ago. Her smoking use included cigarettes. She has a 60.00 pack-year smoking history. She has never used smokeless tobacco. She reports current alcohol use. She reports that she does not use drugs.   Family History:  The patient's family history includes Breast cancer in her maternal aunt and paternal aunt; CVA in her mother; Heart attack in her paternal uncle; Heart disease in her brother and father.    ROS:  Please see the history of present illness.   Otherwise, review of systems are positive for none.   All other systems are reviewed and negative.    PHYSICAL EXAM: VS:  BP 120/60 (BP Location: Left Arm, Patient Position: Sitting, Cuff Size: Normal)   Pulse 76   Ht 5\' 2"  (1.575 m)   Wt 125 lb 2 oz (56.8 kg)   SpO2 96%   BMI 22.89 kg/m  , BMI Body mass index is 22.89 kg/m. GEN: Well nourished, well developed, in no acute distress  HEENT: normal  Neck: no JVD, carotid bruits, or masses Cardiac: RRR; no murmurs, rubs, or gallops, trace edema  Respiratory:  clear to auscultation bilaterally, normal work of breathing GI: soft, nontender, nondistended, + BS MS: no deformity or atrophy  Skin: warm and dry, no rash Neuro:  Strength and sensation are intact Psych: euthymic mood, full affect   EKG:  EKG is ordered today. The ekg ordered today demonstrates atrial ventricular pacing.  Recent Labs: 07/20/2020: ALT 14; TSH 0.824 09/13/2020: BUN 16; Creatinine, Ser 0.83; Hemoglobin 12.2; Platelets 154; Potassium 3.9; Sodium 146    Lipid Panel    Component Value Date/Time   TRIG 48 02/22/2019 1100      Wt Readings from Last 3 Encounters:  01/19/21 125 lb 2 oz (56.8 kg)  12/13/20 126 lb 6.4  oz (57.3 kg)  10/03/20 124 lb 4 oz (56.4 kg)       PAD Screen 04/05/2016  Previous PAD dx? No  Previous surgical procedure? No  Pain with walking? No  Feet/toe relief with dangling? No  Painful, non-healing ulcers? No  Extremities discolored? No      ASSESSMENT AND PLAN:   1.  Chronic systolic heart failure: She appears to be euvolemic on current dose of furosemide.  She is on optimal medical therapy including  Entresto, bisoprolol and spironolactone. She reports worsening shortness of breath and fatigue over the last few weeks.  Thus, I requested a follow-up echocardiogram.  2. Status post mitral valve replacement with a bioprosthetic valve: This will be evaluated with echocardiogram.  3. Coronary artery disease status post one-vessel LIMA to LAD: No anginal symptoms  4. Persistent atrial fibrillation: She seems to be maintaining in sinus rhythm with amiodarone.  Continue long-term anticoagulation with warfarin.  INR was elevated yesterday but she reports no bleeding at the present time.  5.  Hyperlipidemia: Currently on simvastatin.  She did not tolerate rosuvastatin due to myalgia.  6.  Status post permanent pacemaker placement: Followed by Dr. Caryl Comes.  Unsuccessful upgrade to biventricular pacemaker due to inability to place an LV lead.    Disposition:   FU with me in  6 months  Signed,  Kathlyn Sacramento, MD  01/19/2021 9:01 AM    [place]

## 2021-01-19 NOTE — Patient Instructions (Signed)
Medication Instructions:  Your physician recommends that you continue on your current medications as directed. Please refer to the Current Medication list given to you today.  *If you need a refill on your cardiac medications before your next appointment, please call your pharmacy*   Lab Work: None ordered If you have labs (blood work) drawn today and your tests are completely normal, you will receive your results only by: MyChart Message (if you have MyChart) OR A paper copy in the mail If you have any lab test that is abnormal or we need to change your treatment, we will call you to review the results.   Testing/Procedures: Your physician has requested that you have an echocardiogram. Echocardiography is a painless test that uses sound waves to create images of your heart. It provides your doctor with information about the size and shape of your heart and how well your heart's chambers and valves are working. This procedure takes approximately one hour. There are no restrictions for this procedure.    Follow-Up: At [facility], you and your health needs are our priority.  As part of our continuing mission to provide you with exceptional heart care, we have created designated Provider Care Teams.  These Care Teams include your primary Cardiologist (physician) and Advanced Practice Providers (APPs -  Physician Assistants and Nurse Practitioners) who all work together to provide you with the care you need, when you need it.  We recommend signing up for the patient portal called "MyChart".  Sign up information is provided on this After Visit Summary.  MyChart is used to connect with patients for Virtual Visits (Telemedicine).  Patients are able to view lab/test results, encounter notes, upcoming appointments, etc.  Non-urgent messages can be sent to your provider as well.   To learn more about what you can do with MyChart, go to [email or web address].    Your next appointment:   Your  physician wants you to follow-up in: 6 months You will receive a reminder letter in the mail two months in advance. If you don't receive a letter, please call our office to schedule the follow-up appointment.   The format for your next appointment:   In Person  Provider:   You may see Muhammad Arida, MD or one of the following Advanced Practice Providers on your designated Care Team:   Christopher Berge, NP Ryan Dunn, PA-C Jacquelyn Visser, PA-C Cadence Furth, PA-C Caitlin Walker, NP   Other Instructions N/A  

## 2021-01-23 ENCOUNTER — Other Ambulatory Visit: Payer: Self-pay | Admitting: Cardiovascular Disease

## 2021-01-31 ENCOUNTER — Other Ambulatory Visit: Payer: Self-pay

## 2021-01-31 ENCOUNTER — Ambulatory Visit (INDEPENDENT_AMBULATORY_CARE_PROVIDER_SITE_OTHER): Payer: Medicare Other

## 2021-01-31 DIAGNOSIS — Z5181 Encounter for therapeutic drug level monitoring: Secondary | ICD-10-CM

## 2021-01-31 DIAGNOSIS — Z953 Presence of xenogenic heart valve: Secondary | ICD-10-CM

## 2021-01-31 DIAGNOSIS — I340 Nonrheumatic mitral (valve) insufficiency: Secondary | ICD-10-CM | POA: Diagnosis not present

## 2021-01-31 LAB — POCT INR: INR: 3.6 — AB (ref 2.0–3.0)

## 2021-01-31 NOTE — Patient Instructions (Signed)
-   skip warfarin tonight, then  - START NEW dosage of warfarin 1 tablets every day EXCEPT 1.5 tablets on MONDAYS, WEDNESDAYS & FRIDAYS - Recheck INR in 2 weeks.

## 2021-02-04 ENCOUNTER — Other Ambulatory Visit: Payer: Self-pay | Admitting: Internal Medicine

## 2021-02-04 ENCOUNTER — Other Ambulatory Visit: Payer: Self-pay | Admitting: Cardiovascular Disease

## 2021-02-06 NOTE — Telephone Encounter (Signed)
Refill Request.  

## 2021-02-14 ENCOUNTER — Other Ambulatory Visit: Payer: Self-pay

## 2021-02-14 ENCOUNTER — Ambulatory Visit (INDEPENDENT_AMBULATORY_CARE_PROVIDER_SITE_OTHER): Payer: Medicare Other

## 2021-02-14 DIAGNOSIS — Z953 Presence of xenogenic heart valve: Secondary | ICD-10-CM | POA: Diagnosis not present

## 2021-02-14 DIAGNOSIS — I340 Nonrheumatic mitral (valve) insufficiency: Secondary | ICD-10-CM | POA: Diagnosis not present

## 2021-02-14 DIAGNOSIS — Z5181 Encounter for therapeutic drug level monitoring: Secondary | ICD-10-CM | POA: Diagnosis not present

## 2021-02-14 LAB — POCT INR: INR: 3.5 — AB (ref 2.0–3.0)

## 2021-02-14 NOTE — Patient Instructions (Signed)
-   skip warfarin tonight, then  - START NEW dosage of warfarin 1 tablets every day EXCEPT 1.5 tablets on MONDAYS & FRIDAYS - Recheck INR in 2 weeks.

## 2021-02-26 ENCOUNTER — Other Ambulatory Visit: Payer: Medicare Other

## 2021-02-27 ENCOUNTER — Ambulatory Visit (INDEPENDENT_AMBULATORY_CARE_PROVIDER_SITE_OTHER): Payer: Medicare Other

## 2021-02-27 ENCOUNTER — Other Ambulatory Visit: Payer: Self-pay

## 2021-02-27 DIAGNOSIS — I5022 Chronic systolic (congestive) heart failure: Secondary | ICD-10-CM | POA: Diagnosis not present

## 2021-02-27 LAB — ECHOCARDIOGRAM COMPLETE
AR max vel: 1.44 cm2
AV Area VTI: 1.44 cm2
AV Area mean vel: 1.43 cm2
AV Mean grad: 4 mmHg
AV Peak grad: 6.7 mmHg
Ao pk vel: 1.29 m/s
Calc EF: 33.3 %
MV VTI: 0.77 cm2
S' Lateral: 4.8 cm
Single Plane A2C EF: 34.8 %
Single Plane A4C EF: 28 %

## 2021-02-28 ENCOUNTER — Ambulatory Visit (INDEPENDENT_AMBULATORY_CARE_PROVIDER_SITE_OTHER): Payer: Medicare Other

## 2021-02-28 DIAGNOSIS — Z5181 Encounter for therapeutic drug level monitoring: Secondary | ICD-10-CM | POA: Diagnosis not present

## 2021-02-28 DIAGNOSIS — Z953 Presence of xenogenic heart valve: Secondary | ICD-10-CM | POA: Diagnosis not present

## 2021-02-28 DIAGNOSIS — I340 Nonrheumatic mitral (valve) insufficiency: Secondary | ICD-10-CM | POA: Diagnosis not present

## 2021-02-28 LAB — POCT INR: INR: 2.8 (ref 2.0–3.0)

## 2021-02-28 NOTE — Patient Instructions (Signed)
-   continue dosage of warfarin 1 tablets every day EXCEPT 1.5 tablets on MONDAYS & FRIDAYS - Recheck INR in 3 weeks.

## 2021-03-02 ENCOUNTER — Telehealth: Payer: Self-pay

## 2021-03-02 DIAGNOSIS — I5042 Chronic combined systolic (congestive) and diastolic (congestive) heart failure: Secondary | ICD-10-CM

## 2021-03-02 MED ORDER — FUROSEMIDE 20 MG PO TABS: 40.0000 mg | ORAL_TABLET | Freq: Two times a day (BID) | ORAL | Status: DC

## 2021-03-02 NOTE — Telephone Encounter (Signed)
-----   Message from Wellington Hampshire, MD sent at 03/02/2021  8:04 AM EDT ----- Inform patient that echo showed moderately reduced EF which seems to be stable since 2020. Replaced mitral valve looks ok. Pulmonary pressure seems higher than before. Increase lasix to 40 mg bid. Check BMP in 1 week

## 2021-03-02 NOTE — Telephone Encounter (Signed)
Patient made aware of echo results and Dr. Fletcher Anon' recommendation with verbalized understanding. Patient sts that she has plenty of lasix on hand and does not need a refill at this time. Order placed for 1 wk [facility]

## 2021-03-08 ENCOUNTER — Ambulatory Visit (INDEPENDENT_AMBULATORY_CARE_PROVIDER_SITE_OTHER): Payer: Medicare Other

## 2021-03-08 DIAGNOSIS — I428 Other cardiomyopathies: Secondary | ICD-10-CM

## 2021-03-08 LAB — CUP PACEART REMOTE DEVICE CHECK
Battery Remaining Longevity: 41 mo
Battery Voltage: 2.98 V
Brady Statistic AP VP Percent: 12.15 %
Brady Statistic AP VS Percent: 57.82 %
Brady Statistic AS VP Percent: 13.01 %
Brady Statistic AS VS Percent: 17.02 %
Brady Statistic RA Percent Paced: 62.91 %
Brady Statistic RV Percent Paced: 23.93 %
Date Time Interrogation Session: 20220630070127
Implantable Lead Implant Date: 20160823
Implantable Lead Implant Date: 20160823
Implantable Lead Location: 753859
Implantable Lead Location: 753860
Implantable Lead Model: 5076
Implantable Lead Model: 5076
Implantable Pulse Generator Implant Date: 20160823
Lead Channel Impedance Value: 361 Ohm
Lead Channel Impedance Value: 380 Ohm
Lead Channel Impedance Value: 418 Ohm
Lead Channel Impedance Value: 418 Ohm
Lead Channel Pacing Threshold Amplitude: 0.375 V
Lead Channel Pacing Threshold Amplitude: 0.625 V
Lead Channel Pacing Threshold Pulse Width: 0.4 ms
Lead Channel Pacing Threshold Pulse Width: 0.4 ms
Lead Channel Sensing Intrinsic Amplitude: 1.75 mV
Lead Channel Sensing Intrinsic Amplitude: 1.75 mV
Lead Channel Sensing Intrinsic Amplitude: 10.25 mV
Lead Channel Sensing Intrinsic Amplitude: 10.25 mV
Lead Channel Setting Pacing Amplitude: 2 V
Lead Channel Setting Pacing Amplitude: 2.5 V
Lead Channel Setting Pacing Pulse Width: 0.4 ms
Lead Channel Setting Sensing Sensitivity: 2.8 mV

## 2021-03-09 ENCOUNTER — Other Ambulatory Visit
Admission: RE | Admit: 2021-03-09 | Discharge: 2021-03-09 | Disposition: A | Discharge status: Home / Self Care | Payer: Medicare Other | Source: Ambulatory Visit | Attending: Cardiovascular Disease | Admitting: Cardiovascular Disease

## 2021-03-09 DIAGNOSIS — I5042 Chronic combined systolic (congestive) and diastolic (congestive) heart failure: Secondary | ICD-10-CM

## 2021-03-09 LAB — BASIC METABOLIC PANEL
Anion gap: 9 (ref 5–15)
BUN: 21 mg/dL (ref 8–23)
CO2: 36 mmol/L — ABNORMAL HIGH (ref 22–32)
Calcium: 8.9 mg/dL (ref 8.9–10.3)
Chloride: 96 mmol/L — ABNORMAL LOW (ref 98–111)
Creatinine, Ser: 1.01 mg/dL — ABNORMAL HIGH (ref 0.44–1.00)
GFR, Estimated: 55 mL/min — ABNORMAL LOW (ref >60)
Glucose, Bld: 170 mg/dL — ABNORMAL HIGH (ref 70–99)
Potassium: 4 mmol/L (ref 3.5–5.1)
Sodium: 141 mmol/L (ref 135–145)

## 2021-03-14 ENCOUNTER — Telehealth: Payer: Self-pay

## 2021-03-14 MED ORDER — FUROSEMIDE 20 MG PO TABS: ORAL_TABLET | ORAL | Status: DC

## 2021-03-14 NOTE — Telephone Encounter (Signed)
Patient made aware of lab results and Dr. Tyrell Antonio recommendation. Patient verbalized understanding and voiced appreciation for the call.

## 2021-03-14 NOTE — Telephone Encounter (Signed)
-----   Message from Wellington Hampshire, MD sent at 03/12/2021 10:02 AM EDT ----- Renal function is slightly worse.  Change furosemide dose back to 40 mg in the morning and 20 mg in the afternoon.

## 2021-03-28 NOTE — Progress Notes (Signed)
Remote pacemaker transmission.   

## 2021-04-04 ENCOUNTER — Other Ambulatory Visit: Payer: Self-pay

## 2021-04-04 ENCOUNTER — Ambulatory Visit (INDEPENDENT_AMBULATORY_CARE_PROVIDER_SITE_OTHER): Payer: Medicare Other

## 2021-04-04 DIAGNOSIS — Z953 Presence of xenogenic heart valve: Secondary | ICD-10-CM

## 2021-04-04 DIAGNOSIS — Z5181 Encounter for therapeutic drug level monitoring: Secondary | ICD-10-CM | POA: Diagnosis not present

## 2021-04-04 DIAGNOSIS — I340 Nonrheumatic mitral (valve) insufficiency: Secondary | ICD-10-CM | POA: Diagnosis not present

## 2021-04-04 LAB — POCT INR: INR: 4.2 — AB (ref 2.0–3.0)

## 2021-04-04 NOTE — Patient Instructions (Signed)
-   skip warfarin tonight and tomorrow, then  -  on Friday, resume dosage of warfarin 1 tablets every day EXCEPT 1.5 tablets on MONDAYS & FRIDAYS - Recheck INR in 3 weeks.

## 2021-04-25 ENCOUNTER — Ambulatory Visit (INDEPENDENT_AMBULATORY_CARE_PROVIDER_SITE_OTHER): Payer: Medicare Other

## 2021-04-25 ENCOUNTER — Other Ambulatory Visit: Payer: Self-pay

## 2021-04-25 DIAGNOSIS — Z953 Presence of xenogenic heart valve: Secondary | ICD-10-CM | POA: Diagnosis not present

## 2021-04-25 DIAGNOSIS — I340 Nonrheumatic mitral (valve) insufficiency: Secondary | ICD-10-CM | POA: Diagnosis not present

## 2021-04-25 DIAGNOSIS — Z5181 Encounter for therapeutic drug level monitoring: Secondary | ICD-10-CM

## 2021-04-25 LAB — POCT INR: INR: 2.5 (ref 2.0–3.0)

## 2021-04-25 NOTE — Patient Instructions (Signed)
-   continue dosage of warfarin 1 tablets every day EXCEPT 1.5 tablets on MONDAYS & FRIDAYS - Recheck INR in 4 weeks.

## 2021-05-10 ENCOUNTER — Other Ambulatory Visit: Payer: Self-pay | Admitting: Cardiovascular Disease

## 2021-05-21 ENCOUNTER — Encounter: Payer: Self-pay | Admitting: Internal Medicine

## 2021-05-22 ENCOUNTER — Other Ambulatory Visit: Payer: Self-pay

## 2021-05-22 ENCOUNTER — Ambulatory Visit (INDEPENDENT_AMBULATORY_CARE_PROVIDER_SITE_OTHER): Payer: Medicare Other | Admitting: Internal Medicine

## 2021-05-22 ENCOUNTER — Encounter: Payer: Self-pay | Admitting: Internal Medicine

## 2021-05-22 VITALS — BP 140/64 | HR 72 | Temp 98.1°F | Ht 62.0 in | Wt 122.0 lb

## 2021-05-22 DIAGNOSIS — J449 Chronic obstructive pulmonary disease, unspecified: Secondary | ICD-10-CM

## 2021-05-22 DIAGNOSIS — Z9621 Cochlear implant status: Secondary | ICD-10-CM

## 2021-05-22 DIAGNOSIS — E7800 Pure hypercholesterolemia, unspecified: Secondary | ICD-10-CM

## 2021-05-22 DIAGNOSIS — I10 Essential (primary) hypertension: Secondary | ICD-10-CM

## 2021-05-22 DIAGNOSIS — Z23 Encounter for immunization: Secondary | ICD-10-CM

## 2021-05-22 DIAGNOSIS — I25119 Atherosclerotic heart disease of native coronary artery with unspecified angina pectoris: Secondary | ICD-10-CM | POA: Diagnosis not present

## 2021-05-22 DIAGNOSIS — K219 Gastro-esophageal reflux disease without esophagitis: Secondary | ICD-10-CM

## 2021-05-22 DIAGNOSIS — L729 Follicular cyst of the skin and subcutaneous tissue, unspecified: Secondary | ICD-10-CM | POA: Diagnosis not present

## 2021-05-22 DIAGNOSIS — R7303 Prediabetes: Secondary | ICD-10-CM

## 2021-05-22 DIAGNOSIS — D6869 Other thrombophilia: Secondary | ICD-10-CM

## 2021-05-22 DIAGNOSIS — I7409 Other arterial embolism and thrombosis of abdominal aorta: Secondary | ICD-10-CM

## 2021-05-22 DIAGNOSIS — E039 Hypothyroidism, unspecified: Secondary | ICD-10-CM | POA: Diagnosis not present

## 2021-05-22 NOTE — Progress Notes (Signed)
Date:  05/22/2021   Name:  [name]   DOB:  01/04/1935   MRN:  [id number]   Chief Complaint: Establish Care, Flu Vaccine, and Mass (X1 week, Back of right leg, painful, not getting bigger)  Hypertension This is a chronic problem. The problem is controlled. Associated symptoms include shortness of breath. Pertinent negatives include no chest pain, headaches or palpitations. Past treatments include diuretics, beta blockers and angiotensin blockers. The current treatment provides significant improvement. Hypertensive end-organ damage includes CAD/MI, heart failure and PVD. Identifiable causes of hypertension include a thyroid problem.  Hyperlipidemia This is a chronic problem. The problem is controlled. Associated symptoms include myalgias (left leg) and shortness of breath. Pertinent negatives include no chest pain. Current antihyperlipidemic treatment includes statins. The current treatment provides significant improvement of lipids.  Thyroid Problem Presents for follow-up visit. Patient reports no anxiety, constipation, diarrhea, fatigue, hoarse voice or palpitations. The symptoms have been stable. Her past medical history is significant for heart failure and hyperlipidemia.  Gastroesophageal Reflux She reports no abdominal pain, no chest pain, no choking, no heartburn, no hoarse voice, no sore throat or no wheezing. This is a chronic problem. The problem occurs occasionally. Pertinent negatives include no fatigue. She has tried a PPI for the symptoms.  Leg pain - has neuropathic left leg pain that is chronic and relieved by gabapentin nightly.  This was prescribed previously by PCP.  Lab Results  Component Value Date   CREATININE 1.01 (H) 03/09/2021   BUN 21 03/09/2021   NA 141 03/09/2021   K 4.0 03/09/2021   CL 96 (L) 03/09/2021   CO2 36 (H) 03/09/2021   Lab Results  Component Value Date   TRIG 48 02/22/2019   Lab Results  Component Value Date   TSH 0.824 07/20/2020   Lab  Results  Component Value Date   HGBA1C 6.0 (H) 07/18/2018   Lab Results  Component Value Date   WBC 7.4 09/13/2020   HGB 12.2 09/13/2020   HCT 35.4 09/13/2020   MCV 98 (H) 09/13/2020   PLT 154 09/13/2020   Lab Results  Component Value Date   ALT 14 07/20/2020   AST 15 07/20/2020   ALKPHOS 76 07/20/2020   BILITOT 0.4 07/20/2020     Review of Systems  Constitutional:  Negative for appetite change, chills, fatigue and fever.  HENT:  Positive for hearing loss (with cochlear implant due to meniere's dz). Negative for hoarse voice and sore throat.   Eyes:  Positive for visual disturbance (macular degeneration).  Respiratory:  Positive for shortness of breath. Negative for choking and wheezing.   Cardiovascular:  Negative for chest pain, palpitations and leg swelling.  Gastrointestinal:  Negative for abdominal pain, constipation, diarrhea and heartburn.  Musculoskeletal:  Positive for myalgias (left leg).  Skin:  Negative for color change and rash.  Allergic/Immunologic: Negative for environmental allergies.  Neurological:  Positive for dizziness and weakness (walks with cane). Negative for light-headedness and headaches.  Hematological:  Bruises/bleeds easily (on warfarin).  Psychiatric/Behavioral:  Negative for dysphoric mood and sleep disturbance. The patient is not nervous/anxious.    Patient Active Problem List   Diagnosis Date Noted   Aortoiliac occlusive disease ([facility]) 05/22/2021   Cochlear implant status 05/22/2021   Acquired thrombophilia ([place]) 08/11/2020   CRI (chronic renal insufficiency), stage 2 (mild) 08/11/2020   Primary insomnia 07/31/2019   Osteoporosis, postmenopausal 01/23/2018   Coronary artery disease involving native coronary artery of native heart with angina pectoris ([place])  01/13/2017   Chronic diastolic CHF (congestive heart failure) (HCC)    S/P placement of cardiac pacemaker 05/02/15, medtronic 05/03/2015   Bradycardia 05/03/2015   Presence of cardiac  pacemaker 05/03/2015   S/P mitral valve replacement with bioprosthetic valve, tricuspid valve repair, maze procedure and CABG x1 04/25/2015   S/P tricuspid valve repair 04/25/2015   S/P Maze operation for atrial fibrillation 04/25/2015   S/P CABG x 1 04/25/2015   Pulmonary hypertension (HCC)    Tricuspid regurgitation    Chronic combined systolic and diastolic CHF (congestive heart failure) (HCC)    Chronic systolic heart failure ([place]) 11/10/2014   Essential hypertension 11/10/2014   Mitral regurgitation 11/10/2014   Hyperlipidemia 11/10/2014   Disorder of mitral valve 11/10/2014   Atrial fibrillation ([place]) 11/10/2014   Acid reflux 05/10/2014   Prediabetes 05/08/2014   Auditory vertigo 05/08/2014   Adult hypothyroidism 05/08/2014   Hypercholesterolemia 05/08/2014   H/O adenomatous polyp of colon 05/08/2014   Stage 4 very severe COPD by GOLD classification ([place]) 03/28/2014    Allergies  Allergen Reactions   Ace Inhibitors Cough and Other (See Comments)   Amoxicillin-Pot Clavulanate Swelling and Other (See Comments)    SWOLLEN JOINTS  Has patient had a PCN reaction causing immediate rash, facial/tongue/throat swelling, SOB or lightheadedness with hypotension: Yes Has patient had a PCN reaction causing severe rash involving mucus membranes or skin necrosis: No Has patient had a PCN reaction that required hospitalization No Has patient had a PCN reaction occurring within the last 10 years: No If all of the above answers are "NO", then may proceed with Cephalosporin   Penicillins Swelling and Other (See Comments)    SWOLLEN JOINTS  Has patient had a PCN reaction causing immediate rash, facial/tongue/throat swelling, SOB or lightheadedness with hypotension: Yes Has patient had a PCN reaction causing severe rash involving mucus membranes or skin necrosis: No Has patient had a PCN reaction that required hospitalization No Has patient had a PCN reaction occurring within the last 10  years: No If all of the above answers are "NO", then may proceed with Cephalosporin use.  Swollen joints   Rosuvastatin Other (See Comments)   Nifedipine Other (See Comments)    UNSPECIFIED UNSPECIFIED    Propranolol Other (See Comments)    UNSPECIFIED UNSPECIFIED    Diazepam Other (See Comments)    Made her "hyper"    Meperidine Other (See Comments)    Made her "hyper"     Propoxyphene Other (See Comments)    Made her "hyper"      Past Surgical History:  Procedure Laterality Date   APPENDECTOMY     APPLICATION OF A-CELL OF CHEST/ABDOMEN N/A 11/21/2015   Procedure: APPLICATION OF A-CELL OF CHEST/ABDOMEN;  Surgeon: Loel Lofty Dillingham, DO;  Location: [place];  Service: Plastics;  Laterality: N/A;   APPLICATION OF A-CELL OF CHEST/ABDOMEN Right 12/21/2015   Procedure: APPLICATION OF A-CELL OF RIGHT CHEST;  Surgeon: Loel Lofty Dillingham, DO;  Location: [place];  Service: Plastics;  Laterality: Right;   APPLICATION OF A-CELL OF CHEST/ABDOMEN Right 01/11/2016   Procedure: APPLICATION OF A-CELL OF RIGHT CHEST;  Surgeon: Loel Lofty Dillingham, DO;  Location: [place];  Service: Plastics;  Laterality: Right;   APPLICATION OF A-CELL OF CHEST/ABDOMEN Right 02/12/2016   Procedure: APPLICATION OF A-CELL TO RIGHT CHEST WOUND;  Surgeon: Loel Lofty Dillingham, DO;  Location: [place];  Service: Plastics;  Laterality: Right;   APPLICATION OF WOUND VAC N/A 05/09/2015   Procedure: APPLICATION OF WOUND  VAC;  Surgeon: Rexene Alberts, MD;  Location: [place];  Service: Thoracic;  Laterality: N/A;   APPLICATION OF WOUND VAC N/A 11/13/2015   Procedure: APPLICATION OF WOUND VAC;  Surgeon: Rexene Alberts, MD;  Location: [place];  Service: Thoracic;  Laterality: N/A;   APPLICATION OF WOUND VAC N/A 11/21/2015   Procedure: APPLICATION OF WOUND VAC;  Surgeon: Loel Lofty Dillingham, DO;  Location: [place];  Service: Plastics;  Laterality: N/A;   APPLICATION OF WOUND VAC Right 12/21/2015   Procedure: APPLICATION OF WOUND VAC to right  chest wall ;  Surgeon: Loel Lofty Dillingham, DO;  Location: [place];  Service: Plastics;  Laterality: Right;   APPLICATION OF WOUND VAC Right 01/11/2016   Procedure: APPLICATION OF WOUND VAC RIGHT CHEST ;  Surgeon: Loel Lofty Dillingham, DO;  Location: [place];  Service: Plastics;  Laterality: Right;   APPLICATION OF WOUND VAC Right 01/24/2016   Procedure: APPLICATION OF WOUND VAC RIGHT CHEST WALL;  Surgeon: Loel Lofty Dillingham, DO;  Location: [place];  Service: Plastics;  Laterality: Right;   APPLICATION OF WOUND VAC Right 02/12/2016   Procedure: RE-APPLICATION OF WOUND VAC TO RIGHT CHEST WOUND;  Surgeon: Loel Lofty Dillingham, DO;  Location: [place];  Service: Plastics;  Laterality: Right;   BIV UPGRADE N/A 06/07/2019   Procedure: BIV UPGRADE;  Surgeon: Deboraha Sprang, MD;  Location: [address] [facility];  Service: Cardiovascular;  Laterality: N/A;   BREAST BIOPSY Left 11/25/06   neg   BREAST BIOPSY Left 01/20/12   /clip-neg   CARDIAC CATHETERIZATION  11/2013   [facility]   CARDIAC CATHETERIZATION  10/2014   [facility]   CARDIAC VALVE REPLACEMENT     CARDIOVERSION N/A 07/20/2018   Procedure: CARDIOVERSION;  Surgeon: Wellington Hampshire, MD;  Location: [facility];  Service: Cardiovascular;  Laterality: N/A;   CARDIOVERSION N/A 09/18/2020   Procedure: CARDIOVERSION;  Surgeon: Minna Merritts, MD;  Location: [facility];  Service: Cardiovascular;  Laterality: N/A;   CATARACT EXTRACTION W/ INTRAOCULAR LENS  IMPLANT, BILATERAL Bilateral 2014   CLIPPING OF ATRIAL APPENDAGE N/A 04/25/2015   Procedure: CLIPPING OF ATRIAL APPENDAGE;  Surgeon: Rexene Alberts, MD;  Location: [place];  Service: Open Heart Surgery;  Laterality: N/A;   COCHLEAR IMPLANT Left 2005?   COLONOSCOPY WITH PROPOFOL N/A 01/20/2018   Procedure: COLONOSCOPY WITH PROPOFOL;  Surgeon: Toledo, Benay Pike, MD;  Location: [facility];  Service: Gastroenterology;  Laterality: N/A;   CORONARY ANGIOPLASTY     CORONARY ARTERY BYPASS GRAFT N/A 04/25/2015   Procedure: CORONARY  ARTERY BYPASS GRAFTING (CABG) x ONE, using left internal mammary artery;  Surgeon: Rexene Alberts, MD;  Location: [place];  Service: Open Heart Surgery;  Laterality: N/A;   DILATION AND CURETTAGE OF UTERUS  "several before hysterectomy"   EP IMPLANTABLE DEVICE N/A 05/02/2015   Procedure: Pacemaker Implant;  Surgeon: Thompson Grayer, MD;  Location: [place] [facility];  Service: Cardiovascular;  Laterality: N/A;   ESOPHAGOGASTRODUODENOSCOPY (EGD) WITH PROPOFOL N/A 01/20/2018   Procedure: ESOPHAGOGASTRODUODENOSCOPY (EGD) WITH PROPOFOL;  Surgeon: Toledo, Benay Pike, MD;  Location: [facility];  Service: Gastroenterology;  Laterality: N/A;   EYE SURGERY     I & D EXTREMITY Right 12/06/2015   Procedure: IRRIGATION AND DEBRIDEMENT RIGHT CHEST WALL WITH ACELL PLACEMENT AND VAC;  Surgeon: Loel Lofty Dillingham, DO;  Location: [place];  Service: Plastics;  Laterality: Right;   INCISION AND DRAINAGE OF WOUND N/A 11/21/2015   Procedure: IRRIGATION AND DEBRIDEMENT WOUND;  Surgeon: Loel Lofty Dillingham, DO;  Location: [place];  Service: Plastics;  Laterality: N/A;   INCISION AND DRAINAGE OF WOUND Right 12/21/2015   Procedure: IRRIGATION AND DEBRIDEMENT right chest wall WOUND;  Surgeon: Loel Lofty Dillingham, DO;  Location: [place];  Service: Plastics;  Laterality: Right;  right chest wall    INCISION AND DRAINAGE OF WOUND Right 01/24/2016   Procedure: IRRIGATION AND DEBRIDEMENT RIGHT CHEST WALL WOUND;  Surgeon: Loel Lofty Dillingham, DO;  Location: [place];  Service: Plastics;  Laterality: Right;   INCISION AND DRAINAGE OF WOUND Right 03/28/2016   Procedure: IRRIGATION AND DEBRIDEMENT RIGHT CHEST WALL WOUND WITH A Cell Placement;  Surgeon: Loel Lofty Dillingham, DO;  Location: [place];  Service: Plastics;  Laterality: Right;   INCISION AND DRAINAGE OF WOUND Right 05/17/2016   Procedure: IRRIGATION AND DEBRIDEMENT OF RIGHT CHEST WOUND WITH A CELL PLACEMENT;  Surgeon: Loel Lofty Dillingham, DO;  Location: [place];  Service: Plastics;  Laterality:  Right;   INSERT / REPLACE / REMOVE PACEMAKER     IRRIGATION AND DEBRIDEMENT OF WOUND WITH SPLIT THICKNESS SKIN GRAFT Right 01/11/2016   Procedure: IRRIGATION AND DEBRIDEMENT OF RIGHT CHEST WOUND ;  Surgeon: Loel Lofty Dillingham, DO;  Location: [place];  Service: Plastics;  Laterality: Right;   MASTECTOMY, PARTIAL Right 2002   positive, partial   MAZE N/A 04/25/2015   Procedure: MAZE;  Surgeon: Rexene Alberts, MD;  Location: [place];  Service: Open Heart Surgery;  Laterality: N/A;   MITRAL VALVE REPAIR N/A 04/25/2015   Procedure: MITRAL VALVE  REPLACEMENT using a 27 mm Edwards Perimount Magna Mitral Ease Valve;  Surgeon: Rexene Alberts, MD;  Location: [place];  Service: Open Heart Surgery;  Laterality: N/A;   RIGHT HEART CATH N/A 02/08/2019   Procedure: RIGHT HEART CATH;  Surgeon: Wellington Hampshire, MD;  Location: [place] [facility];  Service: Cardiovascular;  Laterality: N/A;   SKIN SPLIT GRAFT Right 02/12/2016   Procedure: IRRIGATION AND DEBRIDEMENT RIGHT CHEST WOUND;  Surgeon: Loel Lofty Dillingham, DO;  Location: [place];  Service: Plastics;  Laterality: Right;   STERNAL WIRES REMOVAL N/A 06/05/2015   Procedure: STERNAL WIRES REMOVAL;  Surgeon: Rexene Alberts, MD;  Location: [place];  Service: Thoracic;  Laterality: N/A;   STERNAL WOUND DEBRIDEMENT N/A 05/09/2015   Procedure: STERNAL WOUND DEBRIDEMENT;  Surgeon: Rexene Alberts, MD;  Location: [place];  Service: Thoracic;  Laterality: N/A;   STERNAL WOUND DEBRIDEMENT N/A 11/13/2015   Procedure: Excisional drainage of RIGHT Chest wall mass and breast mass ;  Surgeon: Rexene Alberts, MD;  Location: [place];  Service: Thoracic;  Laterality: N/A;   TEE WITHOUT CARDIOVERSION N/A 04/25/2015   Procedure: TRANSESOPHAGEAL ECHOCARDIOGRAM (TEE);  Surgeon: Rexene Alberts, MD;  Location: [place];  Service: Open Heart Surgery;  Laterality: N/A;   TONSILLECTOMY     TRAM Right 11/18/2015   Procedure: VRAM Vertical Rectus Abdominus Muscle Flap;  Surgeon: Loel Lofty Dillingham, DO;   Location: [place];  Service: Plastics;  Laterality: Right;  RIght Back   TRICUSPID VALVE REPLACEMENT N/A 04/25/2015   Procedure: TRICUSPID VALVE REPAIR;  Surgeon: Rexene Alberts, MD;  Location: [place];  Service: Open Heart Surgery;  Laterality: N/A;   VAGINAL HYSTERECTOMY      Social History   Tobacco Use   Smoking status: Former    Packs/day: 1.50    Years: 40.00    Pack years: 60.00    Types: Cigarettes  Quit date: 04/20/1998    Years since quitting: 23.1   Smokeless tobacco: Never  Vaping Use   Vaping Use: Never used  Substance Use Topics   Alcohol use: Yes    Comment: 11/10/2015 "glass of wine a few times/year, if that"   Drug use: No     Medication list has been reviewed and updated.  Current Meds  Medication Sig   acetaminophen (TYLENOL) 500 MG tablet Take 1,000 mg by mouth every 6 (six) hours as needed (for pain.).   AMBULATORY NON FORMULARY MEDICATION Inogen at Home Oxygen machine with tubing. DX: COPD DX Code: J44.9   amiodarone (PACERONE) 200 MG tablet Take 1 tablet (200 mg total) by mouth daily.   bisoprolol (ZEBETA) 5 MG tablet Take 0.5 tablets (2.5 mg total) by mouth daily.   diphenhydrAMINE (BENADRYL) 25 mg capsule Take 25 mg by mouth every 6 (six) hours as needed for allergies.   docusate sodium (COLACE) 100 MG capsule Take 100 mg by mouth 2 (two) times daily as needed (constipation.).   ENTRESTO 49-51 MG TAKE 1 TABLET (49/51 MG) BY MOUTH TWICE DAILY *DOSE INCREASE*   furosemide (LASIX) 20 MG tablet Take 2 tablets (40 mg) in the morning and 1 tablet (20) mg in the evening.   gabapentin (NEURONTIN) 100 MG capsule Take 200 mg by mouth at bedtime.    ipratropium-albuterol (DUONEB) 0.5-2.5 (3) MG/3ML SOLN Inhale 3 mLs into the lungs every 6 (six) hours as needed.   levothyroxine (SYNTHROID, LEVOTHROID) 88 MCG tablet Take 88 mcg by mouth daily before breakfast.   Multiple Vitamins-Minerals (PRESERVISION AREDS 2 PO) Take 1 tablet by mouth 2 (two) times daily.    OXYGEN  Inhale 2 L into the lungs continuous.   pantoprazole (PROTONIX) 40 MG tablet Take 40 mg by mouth 2 (two) times daily.    simvastatin (ZOCOR) 10 MG tablet TAKE 1 TABLET BY MOUTH EVERYDAY AT BEDTIME   spironolactone (ALDACTONE) 25 MG tablet TAKE 1 TABLET BY MOUTH EVERY DAY *DOSE INCREASE*   warfarin (COUMADIN) 2.5 MG tablet TAKE AS DIRECTED PER COUMADIN CLINIC    PHQ 2/9 Scores 05/22/2021 07/03/2015  PHQ - 2 Score 0 0  PHQ- 9 Score 1 3    GAD 7 : Generalized Anxiety Score 05/22/2021  Nervous, Anxious, on Edge 0  Control/stop worrying 0  Worry too much - different things 0  Trouble relaxing 0  Restless 0  Easily annoyed or irritable 0  Afraid - awful might happen 0  Total GAD 7 Score 0    BP Readings from Last 3 Encounters:  05/22/21 140/64  01/19/21 120/60  12/13/20 130/74    Physical Exam Vitals and nursing note reviewed.  Constitutional:      General: She is not in acute distress.    Appearance: Normal appearance. She is well-developed.  HENT:     Head: Normocephalic and atraumatic.     Comments: Left sided cochlear implant Cardiovascular:     Rate and Rhythm: Normal rate. Rhythm irregular.     Pulses: Normal pulses.  Pulmonary:     Effort: Pulmonary effort is normal. No respiratory distress.     Breath sounds: No wheezing or rhonchi.  Musculoskeletal:     Cervical back: Normal range of motion.     Right lower leg: No edema.     Left lower leg: No edema.     Comments: 1 cm smooth, mobile subcutaneous mass posterior right calf; non tender  Lymphadenopathy:     Cervical: No  cervical adenopathy.  Skin:    General: Skin is warm and dry.     Findings: No rash.  Neurological:     General: No focal deficit present.     Mental Status: She is alert and oriented to person, place, and time.  Psychiatric:        Mood and Affect: Mood normal.        Behavior: Behavior normal.    Wt Readings from Last 3 Encounters:  05/22/21 122 lb (55.3 kg)  01/19/21 125 lb 2 oz (56.8  kg)  12/13/20 126 lb 6.4 oz (57.3 kg)    BP 140/64   Pulse 72   Temp 98.1 F (36.7 C) (Oral)   Ht '5\' 2"'$  (1.575 m)   Wt 122 lb (55.3 kg)   SpO2 96%   BMI 22.31 kg/m   Assessment and Plan: 1. Adult hypothyroidism Supplemented for years Labs have been stable.  Last TSH done 11/2020  2. Gastroesophageal reflux disease, unspecified whether esophagitis present Symptoms well controlled on daily PPI No red flag signs such as weight loss, n/v, melena Will continue pantoprazole bid.  3. Essential hypertension Clinically stable exam with well controlled BP. Tolerating medications without side effects at this time. Pt to continue current regimen and low sodium diet. Medications managed by Cardiology.  4. Cyst of subcutaneous tissue Appears benign - pt will monitor for change and call for referral if enlarging or becoming symptomatic.  5. Stage 4 very severe COPD by GOLD classification ([place]) Followed by Pulmonary Not on any medications On O2 around the clock.  6. Prediabetes A1C has been <6.5 consistently  Not on any medications  7. Hypercholesterolemia Tolerating statin medication without side effects at this time Simvastatin dose reduced due to interaction with amiodarone Continue same therapy without change at this time.  8. Aortoiliac occlusive disease ([place]) On statin and warfarin  9. Coronary artery disease involving native coronary artery of native heart with angina pectoris ([place]) Followed by cardiology for CAD and chronic CHF On maximal medications including amiodarone, Entresto, spironolactone, lasix and warfarin.  10. Need for immunization against influenza - Flu Vaccine QUAD High Dose(Flu[place])  11. Cochlear implant status Complete hearing loss due to Meniere's disease  12. Acquired thrombophilia ([place]) On warfarin without bleeding issues INR monitored by cardiology   Partially dictated using [place]. Any errors are unintentional.  Halina Maidens,  MD [facility]  05/22/2021

## 2021-05-23 ENCOUNTER — Ambulatory Visit (INDEPENDENT_AMBULATORY_CARE_PROVIDER_SITE_OTHER): Payer: Medicare Other

## 2021-05-23 DIAGNOSIS — Z953 Presence of xenogenic heart valve: Secondary | ICD-10-CM

## 2021-05-23 DIAGNOSIS — I340 Nonrheumatic mitral (valve) insufficiency: Secondary | ICD-10-CM | POA: Diagnosis not present

## 2021-05-23 DIAGNOSIS — Z5181 Encounter for therapeutic drug level monitoring: Secondary | ICD-10-CM | POA: Diagnosis not present

## 2021-05-23 LAB — POCT INR: INR: 2.4 (ref 2.0–3.0)

## 2021-05-23 NOTE — Patient Instructions (Signed)
-   continue dosage of warfarin 1 tablets every day EXCEPT 1.5 tablets on MONDAYS & FRIDAYS - Recheck INR in 5 weeks.

## 2021-06-07 ENCOUNTER — Ambulatory Visit (INDEPENDENT_AMBULATORY_CARE_PROVIDER_SITE_OTHER): Payer: Medicare Other

## 2021-06-07 DIAGNOSIS — I428 Other cardiomyopathies: Secondary | ICD-10-CM

## 2021-06-07 LAB — CUP PACEART REMOTE DEVICE CHECK
Battery Remaining Longevity: 45 mo
Battery Voltage: 2.97 V
Brady Statistic AP VP Percent: 12.07 %
Brady Statistic AP VS Percent: 53.52 %
Brady Statistic AS VP Percent: 14.48 %
Brady Statistic AS VS Percent: 19.94 %
Brady Statistic RA Percent Paced: 59.56 %
Brady Statistic RV Percent Paced: 25.84 %
Date Time Interrogation Session: 20220929112427
Implantable Lead Implant Date: 20160823
Implantable Lead Implant Date: 20160823
Implantable Lead Location: 753859
Implantable Lead Location: 753860
Implantable Lead Model: 5076
Implantable Lead Model: 5076
Implantable Pulse Generator Implant Date: 20160823
Lead Channel Impedance Value: 361 Ohm
Lead Channel Impedance Value: 361 Ohm
Lead Channel Impedance Value: 418 Ohm
Lead Channel Impedance Value: 418 Ohm
Lead Channel Pacing Threshold Amplitude: 0.375 V
Lead Channel Pacing Threshold Amplitude: 0.625 V
Lead Channel Pacing Threshold Pulse Width: 0.4 ms
Lead Channel Pacing Threshold Pulse Width: 0.4 ms
Lead Channel Sensing Intrinsic Amplitude: 1 mV
Lead Channel Sensing Intrinsic Amplitude: 1 mV
Lead Channel Sensing Intrinsic Amplitude: 8.875 mV
Lead Channel Sensing Intrinsic Amplitude: 8.875 mV
Lead Channel Setting Pacing Amplitude: 2 V
Lead Channel Setting Pacing Amplitude: 2.5 V
Lead Channel Setting Pacing Pulse Width: 0.4 ms
Lead Channel Setting Sensing Sensitivity: 2.8 mV

## 2021-06-15 NOTE — Progress Notes (Signed)
Remote pacemaker transmission.   

## 2021-06-26 ENCOUNTER — Other Ambulatory Visit: Payer: Self-pay | Admitting: Cardiology

## 2021-06-26 NOTE — Telephone Encounter (Signed)
Please advise If ok to refill Amiodarone 200 mg qd. Pt is now seeing Dr. Caryl Comes.

## 2021-06-26 NOTE — Telephone Encounter (Signed)
OK to refill: Amiodarone 200 mg - take 1 tablet by mouth once daily #90 w/ no refills  She was due to see Dr. Caryl Comes in July and should follow up with him.  Thanks!

## 2021-06-27 ENCOUNTER — Ambulatory Visit (INDEPENDENT_AMBULATORY_CARE_PROVIDER_SITE_OTHER): Payer: Medicare Other

## 2021-06-27 ENCOUNTER — Other Ambulatory Visit: Payer: Self-pay

## 2021-06-27 DIAGNOSIS — Z5181 Encounter for therapeutic drug level monitoring: Secondary | ICD-10-CM

## 2021-06-27 DIAGNOSIS — Z953 Presence of xenogenic heart valve: Secondary | ICD-10-CM | POA: Diagnosis not present

## 2021-06-27 DIAGNOSIS — I340 Nonrheumatic mitral (valve) insufficiency: Secondary | ICD-10-CM

## 2021-06-27 LAB — POCT INR: INR: 3.2 — AB (ref 2.0–3.0)

## 2021-06-27 NOTE — Patient Instructions (Signed)
-   skip warfarin tonight, then - continue dosage of warfarin 1 tablets every day EXCEPT 1.5 tablets on MONDAYS & FRIDAYS - Recheck INR in 4 weeks.

## 2021-07-20 ENCOUNTER — Ambulatory Visit (INDEPENDENT_AMBULATORY_CARE_PROVIDER_SITE_OTHER): Payer: Medicare Other | Admitting: Cardiovascular Disease

## 2021-07-20 ENCOUNTER — Other Ambulatory Visit: Payer: Self-pay

## 2021-07-20 VITALS — BP 148/70 | HR 75 | Ht 62.0 in | Wt 123.0 lb

## 2021-07-20 DIAGNOSIS — I25119 Atherosclerotic heart disease of native coronary artery with unspecified angina pectoris: Secondary | ICD-10-CM | POA: Diagnosis not present

## 2021-07-20 DIAGNOSIS — I2510 Atherosclerotic heart disease of native coronary artery without angina pectoris: Secondary | ICD-10-CM | POA: Diagnosis not present

## 2021-07-20 DIAGNOSIS — I4819 Other persistent atrial fibrillation: Secondary | ICD-10-CM

## 2021-07-20 DIAGNOSIS — Z950 Presence of cardiac pacemaker: Secondary | ICD-10-CM

## 2021-07-20 DIAGNOSIS — Z953 Presence of xenogenic heart valve: Secondary | ICD-10-CM

## 2021-07-20 DIAGNOSIS — I5022 Chronic systolic (congestive) heart failure: Secondary | ICD-10-CM

## 2021-07-20 DIAGNOSIS — E785 Hyperlipidemia, unspecified: Secondary | ICD-10-CM

## 2021-07-20 NOTE — Addendum Note (Signed)
Addended by: Lamar Laundry on: 07/20/2021 04:18 PM   Modules accepted: Orders

## 2021-07-20 NOTE — Patient Instructions (Signed)

## 2021-07-20 NOTE — Progress Notes (Signed)
Cardiology Office Note   Date:  07/20/2021   ID:  [name], DOB 03/13/1935, MRN [id number]  PCP:  Glean Hess, MD  Cardiologist:   Kathlyn Sacramento, MD   Chief Complaint  Patient presents with   Other    12 month f/u no complaints today. Meds reviewed verbally with pt.      History of Present Illness: [name] is a 86 y.o. female who presents for a follow-up visit regarding chronic systolic heart failure, atrial fibrillation and mitral regurgitation.  She has known history of COPD, breast cancer status post right partial mastectomy, hypothyroidism, hyperlipidemia and Mnire disease.   She underwent mitral valve replacement with a bioprosthetic valve, tricuspid valve repair, one-vessel CABG with LIMA to LAD and maze procedure in August 2016. This was complicated by sternal wound infection which required debridement. She also had complete heart block and underwent permanent pacemaker placement.  In 08/2017, she had worsening heart failure.  Echocardiogram showed an EF of 30 to 35%. She was seen by EP and it was felt that LV dysfunction was in the context of RV apical pacing. That was inactivated and she noted immediate improvement in symptoms.  She again had worsening heart failure symptoms in late 2019.  She had a drop in EF to 20 to 25% in the setting of atrial fibrillation with RVR.  She was rate controlled and started on amiodarone and subsequently underwent cardioversion with restoration of sinus rhythm.  EF improved to 35 to 40% after that.   Right heart catheterization was done in June 2020 which showed moderately elevated filling pressures, severe pulmonary hypertension and mildly reduced cardiac output.  There was no evidence of left-to-right intracardiac shunting by saturation run.  Prominent V waves were noted on pulmonary capillary wedge pressure tracing suggestive of significant mitral regurgitation.  Symptoms improved after increasing furosemide.   She underwent  attempted upgrade of her pacemaker to a biventricular device but the procedure was not successful due to inability to place an LV lead.  She had recurrent atrial fibrillation in January that required cardioversion.  She has been in sinus rhythm since then.    She was noted to be more volume overloaded during last visit.  An echocardiogram was done in June which showed an EF of 35 to 40% with severely reduced RV systolic function and severe pulmonary hypertension.  The bioprosthetic mitral valve was functioning normally with a mean gradient of 5 mmHg.  I increase the dose of furosemide 40 mg twice daily with subsequent labs showed some worsening renal function and thus I changed the dose to 40 mg in the morning and 20 mg in the afternoon.  She has been stable since then.   Past Medical History:  Diagnosis Date   Acute on chronic respiratory failure with hypoxia ([place]) 02/04/2019   Added automatically from request for surgery [phone]   Acute on chronic systolic heart failure ([place])    Anxiety    Arthritis    "hands" (11/10/2015)   Breast cancer ([place]) 2002   right   CAD s/p CABG x 1    a. 04/2015 LIMA to LAD   Chronic combined systolic (congestive) and diastolic (congestive) heart failure ([place])    a. 01/2015 Echo: EF 50-55%, Gr2 DD (preop valve surgery); b. 08/2015 Echo: EF 30-35%, diff HK. Gr2 DD; c. 09/2016 Echo: EF 50-55%; d. 07/2018 Echo: EF 20-25%; e. 03/2019 Echo: EF 35-40%, diff HK. Mod red RV fxn, RVSP 34.73mmHg. Mildly  dil LA. Nl MV prosthesis. Triv AI.   COPD (chronic obstructive pulmonary disease) (HCC)    Coronary artery disease    GERD (gastroesophageal reflux disease)    History of colon polyps    History of mitral valve replacement    a. 04/2015 s/p 27 mm [facility] Mitral bovine bioprosthetic tissue valve   Hyperlipidemia    Hypertension    Hypothyroidism    Maze operation for AF w/ LAA clipping    a. 04/2015 Complete bilateral atrial lesion set using cryothermy and bipolar  radiofrequency ablation with clipping of LA appendage   Meniere disease    Meniere's disease    On home oxygen therapy    "2L at night" (11/10/2015)   PAF (paroxysmal atrial fibrillation) ([place])    Personal history of radiation therapy    Pneumonia ~ 2010   PONV (postoperative nausea and vomiting)    Pt felt like eardrums were gonna pop   Post-surgical complete heart block, symptomatic    a. 04/2015 s/p MDT [id number] Advisa DR MRI DC PPM.   Presence of permanent cardiac pacemaker    Pulmonary hypertension (HCC)    Respiratory failure ([place]) 02/22/2019   Surgical wound, non healing - chest wall 11/10/2015   Tricuspid Regurgitation s/p Repair    a. 04/2015 s/p 26 mm Edwards mc3 ring annuloplasty   Wound infection 10/13/2015   Superficial sternal wound infection    Past Surgical History:  Procedure Laterality Date   APPENDECTOMY     APPLICATION OF A-CELL OF CHEST/ABDOMEN N/A 11/21/2015   Procedure: APPLICATION OF A-CELL OF CHEST/ABDOMEN;  Surgeon: Loel Lofty Dillingham, DO;  Location: [place];  Service: Plastics;  Laterality: N/A;   APPLICATION OF A-CELL OF CHEST/ABDOMEN Right 12/21/2015   Procedure: APPLICATION OF A-CELL OF RIGHT CHEST;  Surgeon: Loel Lofty Dillingham, DO;  Location: [place];  Service: Plastics;  Laterality: Right;   APPLICATION OF A-CELL OF CHEST/ABDOMEN Right 01/11/2016   Procedure: APPLICATION OF A-CELL OF RIGHT CHEST;  Surgeon: Loel Lofty Dillingham, DO;  Location: [place];  Service: Plastics;  Laterality: Right;   APPLICATION OF A-CELL OF CHEST/ABDOMEN Right 02/12/2016   Procedure: APPLICATION OF A-CELL TO RIGHT CHEST WOUND;  Surgeon: Loel Lofty Dillingham, DO;  Location: [place];  Service: Plastics;  Laterality: Right;   APPLICATION OF WOUND VAC N/A 05/09/2015   Procedure: APPLICATION OF WOUND VAC;  Surgeon: Rexene Alberts, MD;  Location: [place];  Service: Thoracic;  Laterality: N/A;   APPLICATION OF WOUND VAC N/A 11/13/2015   Procedure: APPLICATION OF WOUND VAC;  Surgeon: Rexene Alberts, MD;   Location: [place];  Service: Thoracic;  Laterality: N/A;   APPLICATION OF WOUND VAC N/A 11/21/2015   Procedure: APPLICATION OF WOUND VAC;  Surgeon: Loel Lofty Dillingham, DO;  Location: [place];  Service: Plastics;  Laterality: N/A;   APPLICATION OF WOUND VAC Right 12/21/2015   Procedure: APPLICATION OF WOUND VAC to right chest wall ;  Surgeon: Loel Lofty Dillingham, DO;  Location: [place];  Service: Plastics;  Laterality: Right;   APPLICATION OF WOUND VAC Right 01/11/2016   Procedure: APPLICATION OF WOUND VAC RIGHT CHEST ;  Surgeon: Loel Lofty Dillingham, DO;  Location: [place];  Service: Plastics;  Laterality: Right;   APPLICATION OF WOUND VAC Right 01/24/2016   Procedure: APPLICATION OF WOUND VAC RIGHT CHEST WALL;  Surgeon: Loel Lofty Dillingham, DO;  Location: [place];  Service: Plastics;  Laterality: Right;   APPLICATION OF WOUND VAC Right 02/12/2016  Procedure: RE-APPLICATION OF WOUND VAC TO RIGHT CHEST WOUND;  Surgeon: Loel Lofty Dillingham, DO;  Location: [place];  Service: Plastics;  Laterality: Right;   BIV UPGRADE N/A 06/07/2019   Procedure: BIV UPGRADE;  Surgeon: Deboraha Sprang, MD;  Location: [place] [facility];  Service: Cardiovascular;  Laterality: N/A;   BREAST BIOPSY Left 11/25/06   neg   BREAST BIOPSY Left 01/20/12   /clip-neg   CARDIAC CATHETERIZATION  11/2013   [facility]   CARDIAC CATHETERIZATION  10/2014   [facility]   CARDIAC VALVE REPLACEMENT     CARDIOVERSION N/A 07/20/2018   Procedure: CARDIOVERSION;  Surgeon: Wellington Hampshire, MD;  Location: [facility];  Service: Cardiovascular;  Laterality: N/A;   CARDIOVERSION N/A 09/18/2020   Procedure: CARDIOVERSION;  Surgeon: Minna Merritts, MD;  Location: [facility];  Service: Cardiovascular;  Laterality: N/A;   CATARACT EXTRACTION W/ INTRAOCULAR LENS  IMPLANT, BILATERAL Bilateral 2014   CLIPPING OF ATRIAL APPENDAGE N/A 04/25/2015   Procedure: CLIPPING OF ATRIAL APPENDAGE;  Surgeon: Rexene Alberts, MD;  Location: [place];  Service: Open Heart Surgery;  Laterality: N/A;    COCHLEAR IMPLANT Left 2005?   COLONOSCOPY WITH PROPOFOL N/A 01/20/2018   Procedure: COLONOSCOPY WITH PROPOFOL;  Surgeon: Toledo, Benay Pike, MD;  Location: [facility];  Service: Gastroenterology;  Laterality: N/A;   CORONARY ANGIOPLASTY     CORONARY ARTERY BYPASS GRAFT N/A 04/25/2015   Procedure: CORONARY ARTERY BYPASS GRAFTING (CABG) x ONE, using left internal mammary artery;  Surgeon: Rexene Alberts, MD;  Location: [place];  Service: Open Heart Surgery;  Laterality: N/A;   DILATION AND CURETTAGE OF UTERUS  "several before hysterectomy"   EP IMPLANTABLE DEVICE N/A 05/02/2015   Procedure: Pacemaker Implant;  Surgeon: Thompson Grayer, MD;  Location: [place] [facility];  Service: Cardiovascular;  Laterality: N/A;   ESOPHAGOGASTRODUODENOSCOPY (EGD) WITH PROPOFOL N/A 01/20/2018   Procedure: ESOPHAGOGASTRODUODENOSCOPY (EGD) WITH PROPOFOL;  Surgeon: Toledo, Benay Pike, MD;  Location: [facility];  Service: Gastroenterology;  Laterality: N/A;   EYE SURGERY     I & D EXTREMITY Right 12/06/2015   Procedure: IRRIGATION AND DEBRIDEMENT RIGHT CHEST WALL WITH ACELL PLACEMENT AND VAC;  Surgeon: Loel Lofty Dillingham, DO;  Location: [place];  Service: Plastics;  Laterality: Right;   INCISION AND DRAINAGE OF WOUND N/A 11/21/2015   Procedure: IRRIGATION AND DEBRIDEMENT WOUND;  Surgeon: Loel Lofty Dillingham, DO;  Location: [place];  Service: Plastics;  Laterality: N/A;   INCISION AND DRAINAGE OF WOUND Right 12/21/2015   Procedure: IRRIGATION AND DEBRIDEMENT right chest wall WOUND;  Surgeon: Loel Lofty Dillingham, DO;  Location: [place];  Service: Plastics;  Laterality: Right;  right chest wall    INCISION AND DRAINAGE OF WOUND Right 01/24/2016   Procedure: IRRIGATION AND DEBRIDEMENT RIGHT CHEST WALL WOUND;  Surgeon: Loel Lofty Dillingham, DO;  Location: [place];  Service: Plastics;  Laterality: Right;   INCISION AND DRAINAGE OF WOUND Right 03/28/2016   Procedure: IRRIGATION AND DEBRIDEMENT RIGHT CHEST WALL WOUND WITH A Cell  Placement;  Surgeon: Loel Lofty Dillingham, DO;  Location: [place];  Service: Plastics;  Laterality: Right;   INCISION AND DRAINAGE OF WOUND Right 05/17/2016   Procedure: IRRIGATION AND DEBRIDEMENT OF RIGHT CHEST WOUND WITH A CELL PLACEMENT;  Surgeon: Loel Lofty Dillingham, DO;  Location: [place];  Service: Plastics;  Laterality: Right;   INSERT / REPLACE / REMOVE PACEMAKER     IRRIGATION AND DEBRIDEMENT OF WOUND WITH SPLIT THICKNESS SKIN GRAFT Right 01/11/2016  Procedure: IRRIGATION AND DEBRIDEMENT OF RIGHT CHEST WOUND ;  Surgeon: Loel Lofty Dillingham, DO;  Location: [place];  Service: Plastics;  Laterality: Right;   MASTECTOMY, PARTIAL Right 2002   positive, partial   MAZE N/A 04/25/2015   Procedure: MAZE;  Surgeon: Rexene Alberts, MD;  Location: [place];  Service: Open Heart Surgery;  Laterality: N/A;   MITRAL VALVE REPAIR N/A 04/25/2015   Procedure: MITRAL VALVE  REPLACEMENT using a 27 mm Edwards Perimount Magna Mitral Ease Valve;  Surgeon: Rexene Alberts, MD;  Location: [place];  Service: Open Heart Surgery;  Laterality: N/A;   RIGHT HEART CATH N/A 02/08/2019   Procedure: RIGHT HEART CATH;  Surgeon: Wellington Hampshire, MD;  Location: [place] [facility];  Service: Cardiovascular;  Laterality: N/A;   SKIN SPLIT GRAFT Right 02/12/2016   Procedure: IRRIGATION AND DEBRIDEMENT RIGHT CHEST WOUND;  Surgeon: Loel Lofty Dillingham, DO;  Location: [place];  Service: Plastics;  Laterality: Right;   STERNAL WIRES REMOVAL N/A 06/05/2015   Procedure: STERNAL WIRES REMOVAL;  Surgeon: Rexene Alberts, MD;  Location: [place];  Service: Thoracic;  Laterality: N/A;   STERNAL WOUND DEBRIDEMENT N/A 05/09/2015   Procedure: STERNAL WOUND DEBRIDEMENT;  Surgeon: Rexene Alberts, MD;  Location: [place];  Service: Thoracic;  Laterality: N/A;   STERNAL WOUND DEBRIDEMENT N/A 11/13/2015   Procedure: Excisional drainage of RIGHT Chest wall mass and breast mass ;  Surgeon: Rexene Alberts, MD;  Location: [place];  Service: Thoracic;  Laterality: N/A;   TEE  WITHOUT CARDIOVERSION N/A 04/25/2015   Procedure: TRANSESOPHAGEAL ECHOCARDIOGRAM (TEE);  Surgeon: Rexene Alberts, MD;  Location: [place];  Service: Open Heart Surgery;  Laterality: N/A;   TONSILLECTOMY     TRAM Right 11/18/2015   Procedure: VRAM Vertical Rectus Abdominus Muscle Flap;  Surgeon: Loel Lofty Dillingham, DO;  Location: [place];  Service: Plastics;  Laterality: Right;  RIght Back   TRICUSPID VALVE REPLACEMENT N/A 04/25/2015   Procedure: TRICUSPID VALVE REPAIR;  Surgeon: Rexene Alberts, MD;  Location: [place];  Service: Open Heart Surgery;  Laterality: N/A;   VAGINAL HYSTERECTOMY       Current Outpatient Medications  Medication Sig Dispense Refill   acetaminophen (TYLENOL) 500 MG tablet Take 1,000 mg by mouth every 6 (six) hours as needed (for pain.).     AMBULATORY NON FORMULARY MEDICATION Inogen at Home Oxygen machine with tubing. DX: COPD DX Code: J44.9 1 each 0   amiodarone (PACERONE) 200 MG tablet TAKE 1 TABLET BY MOUTH EVERY DAY 90 tablet 0   bisoprolol (ZEBETA) 5 MG tablet Take 0.5 tablets (2.5 mg total) by mouth daily. 45 tablet 1   diphenhydrAMINE (BENADRYL) 25 mg capsule Take 25 mg by mouth every 6 (six) hours as needed for allergies.     docusate sodium (COLACE) 100 MG capsule Take 100 mg by mouth 2 (two) times daily as needed (constipation.).     ENTRESTO 49-51 MG TAKE 1 TABLET (49/51 MG) BY MOUTH TWICE DAILY *DOSE INCREASE* 60 tablet 5   furosemide (LASIX) 20 MG tablet Take 2 tablets (40 mg) in the morning and 1 tablet (20) mg in the evening. 30 tablet    gabapentin (NEURONTIN) 100 MG capsule Take 200 mg by mouth at bedtime.   1   ipratropium-albuterol (DUONEB) 0.5-2.5 (3) MG/3ML SOLN Inhale 3 mLs into the lungs every 6 (six) hours as needed. 120 mL 11   levothyroxine (SYNTHROID, LEVOTHROID) 88 MCG tablet Take 88 mcg  by mouth daily before breakfast.     Multiple Vitamins-Minerals (PRESERVISION AREDS 2 PO) Take 1 tablet by mouth 2 (two) times daily.      OXYGEN Inhale 2 L into  the lungs continuous.     pantoprazole (PROTONIX) 40 MG tablet Take 40 mg by mouth 2 (two) times daily.      simvastatin (ZOCOR) 10 MG tablet TAKE 1 TABLET BY MOUTH EVERYDAY AT BEDTIME 90 tablet 1   spironolactone (ALDACTONE) 25 MG tablet TAKE 1 TABLET BY MOUTH EVERY DAY *DOSE INCREASE* 90 tablet 1   warfarin (COUMADIN) 2.5 MG tablet TAKE AS DIRECTED PER COUMADIN CLINIC 140 tablet 1   No current facility-administered medications for this visit.    Allergies:   Ace inhibitors, Amoxicillin-pot clavulanate, Penicillins, Rosuvastatin, Nifedipine, Propranolol, Diazepam, Meperidine, and Propoxyphene    Social History:  The patient  reports that she quit smoking about 23 years ago. Her smoking use included cigarettes. She has a 60.00 pack-year smoking history. She has never used smokeless tobacco. She reports current alcohol use. She reports that she does not use drugs.   Family History:  The patient's family history includes Breast cancer in her maternal aunt and paternal aunt; CVA in her mother; Heart attack in her paternal uncle; Heart disease in her brother and father.    ROS:  Please see the history of present illness.   Otherwise, review of systems are positive for none.   All other systems are reviewed and negative.    PHYSICAL EXAM: VS:  BP (!) 148/70 (BP Location: Left Arm, Patient Position: Sitting, Cuff Size: Normal)   Pulse 75   Ht 5\' 2"  (1.575 m)   Wt 123 lb (55.8 kg)   SpO2 96%   BMI 22.50 kg/m  , BMI Body mass index is 22.5 kg/m. GEN: Well nourished, well developed, in no acute distress  HEENT: normal  Neck: no JVD, carotid bruits, or masses Cardiac: RRR; no murmurs, rubs, or gallops, mild edema  Respiratory:  clear to auscultation bilaterally, normal work of breathing GI: soft, nontender, nondistended, + BS MS: no deformity or atrophy  Skin: warm and dry, no rash Neuro:  Strength and sensation are intact Psych: euthymic mood, full affect   EKG:  EKG is ordered  today. The ekg ordered today demonstrates atrial paced rhythm with a very prolonged PR interval?  Versus inappropriate atrial pacing.  Recent Labs: 09/13/2020: Hemoglobin 12.2; Platelets 154 03/09/2021: BUN 21; Creatinine, Ser 1.01; Potassium 4.0; Sodium 141    Lipid Panel    Component Value Date/Time   TRIG 48 02/22/2019 1100      Wt Readings from Last 3 Encounters:  07/20/21 123 lb (55.8 kg)  05/22/21 122 lb (55.3 kg)  01/19/21 125 lb 2 oz (56.8 kg)       PAD Screen 04/05/2016  Previous PAD dx? No  Previous surgical procedure? No  Pain with walking? No  Feet/toe relief with dangling? No  Painful, non-healing ulcers? No  Extremities discolored? No      ASSESSMENT AND PLAN:   1.  Chronic systolic heart failure: She appears to be euvolemic on current dose of furosemide.  She is on optimal medical therapy including  Entresto, bisoprolol and spironolactone. She will have follow-up labs with her primary care physician next month.  2. Status post mitral valve replacement with a bioprosthetic valve: This was functioning normally on most recent echocardiogram.  3. Coronary artery disease status post one-vessel LIMA to LAD: No anginal symptoms  4.  Persistent atrial fibrillation: She seems to be maintaining in sinus rhythm with amiodarone.  Continue long-term anticoagulation with warfarin.    5.  Hyperlipidemia: Currently on simvastatin.  She did not tolerate rosuvastatin due to myalgia.  6.  Status post permanent pacemaker placement: Followed by Dr. Caryl Comes.  Unsuccessful upgrade to biventricular pacemaker due to inability to place an LV lead.  Atrial pacing spikes seem to be under T wave with very prolonged PR.  We will ask EP to review.    Disposition:   FU with me in 6 months  Signed,  Kathlyn Sacramento, MD  07/20/2021 4:05 PM    [place]

## 2021-07-24 ENCOUNTER — Other Ambulatory Visit: Payer: Self-pay | Admitting: Cardiovascular Disease

## 2021-07-24 NOTE — Telephone Encounter (Signed)
Refill request

## 2021-07-25 ENCOUNTER — Ambulatory Visit (INDEPENDENT_AMBULATORY_CARE_PROVIDER_SITE_OTHER): Payer: Medicare Other

## 2021-07-25 ENCOUNTER — Other Ambulatory Visit: Payer: Self-pay

## 2021-07-25 DIAGNOSIS — I340 Nonrheumatic mitral (valve) insufficiency: Secondary | ICD-10-CM | POA: Diagnosis not present

## 2021-07-25 DIAGNOSIS — Z5181 Encounter for therapeutic drug level monitoring: Secondary | ICD-10-CM

## 2021-07-25 DIAGNOSIS — Z953 Presence of xenogenic heart valve: Secondary | ICD-10-CM

## 2021-07-25 LAB — POCT INR: INR: 2.6 (ref 2.0–3.0)

## 2021-07-25 NOTE — Patient Instructions (Signed)
-   continue dosage of warfarin 1 tablets every day EXCEPT 1.5 tablets on MONDAYS & FRIDAYS - Recheck INR in 6 weeks.

## 2021-07-27 ENCOUNTER — Telehealth: Payer: Self-pay

## 2021-07-27 NOTE — Telephone Encounter (Signed)
Spoke with patient, she requested to be seen in [place] office.  Next avail device appt in [place] is 08/01/21.  Schedule patient on Device clinic schedule for 9am.

## 2021-07-27 NOTE — Progress Notes (Signed)
I spoke with the patient, she will only go to [place] office. You and I are in [place] on the morning of 11/23, but your schedule is full so I scheduled her on the device clinic schedule so that I can see her at a time when it looks like we will already have [employer] there for a Loop explant/ reimplant.  I'm not sure of any other way to get her in sooner, is this ok?

## 2021-07-27 NOTE — Telephone Encounter (Signed)
-----   Message from Vickie Epley, MD sent at 07/26/2021  5:36 PM EST ----- Hey, I agree the atrial pace spike is within the T wave. She had a very long first degree AV delay in the past but this looks even longer and is suspicious for a pacemaker malfunction. She should at least have a pacemaker interrogation in person. I have cc'ed the device clinic nurses to get her seen/evaluated. Let me know if any questions/concerns. Lysbeth Galas ----- Message ----- From: Wellington Hampshire, MD Sent: 07/20/2021   4:32 PM EST To: Vickie Epley, MD  [facility] Lysbeth Galas, Dr. Caryl Comes usually sees this patient but he is off.  Do you mind reviewing the EKG from today.  It is scanned in her chart.  Atrial pacemaker spikes seem to be on the T wave.  Not sure if there is some pacemaker malfunction but the patient has no symptoms thanks.

## 2021-07-31 ENCOUNTER — Other Ambulatory Visit: Payer: Self-pay | Admitting: Internal Medicine

## 2021-07-31 MED ORDER — PANTOPRAZOLE SODIUM 40 MG PO TBEC: 40.0000 mg | DELAYED_RELEASE_TABLET | Freq: Two times a day (BID) | ORAL | 0 refills | Status: DC

## 2021-07-31 NOTE — Telephone Encounter (Signed)
Requested medication (s) are due for refill today:   Not sure This was prescribed by another provider.  Pt requesting Dr. Army Melia to fill it now.  Requested medication (s) are on the active medication list:   Yes as a historical med from 6 yrs ago  Future visit scheduled:   Yes   Last ordered: 6 yrs ago  Returned because pt wanting Dr. Army Melia to fill it now.   Requested Prescriptions  Pending Prescriptions Disp Refills   pantoprazole (PROTONIX) 40 MG tablet      Sig: Take 1 tablet (40 mg total) by mouth 2 (two) times daily.     Gastroenterology: Proton Pump Inhibitors Passed - 07/31/2021 11:26 AM      Passed - Valid encounter within last 12 months    Recent Outpatient Visits           2 months ago Adult hypothyroidism   [facility] Glean Hess, MD       Future Appointments             In 3 weeks Army Melia Jesse Sans, MD [facility], [facility]

## 2021-07-31 NOTE — Telephone Encounter (Signed)
Medication Refill - Medication: Pantoprazole   Has the patient contacted their pharmacy? No. This medication was originally prescribed by another provider and is requesting to have it refilled. Please advise.  (Agent: If no, request that the patient contact the pharmacy for the refill. If patient does not wish to contact the pharmacy document the reason why and proceed with request.) (Agent: If yes, when and what did the pharmacy advise?)  Preferred Pharmacy (with phone number or street name):  [id number] - [place], [address]  [address] [place] [address]  Phone: [phone] Fax: [phone]  Hours: Not open 24 hours   Has the patient been seen for an appointment in the last year OR does the patient have an upcoming appointment? Yes.   08/21/21  Agent: Please be advised that RX refills may take up to 3 business days. We ask that you follow-up with your pharmacy.

## 2021-08-01 ENCOUNTER — Other Ambulatory Visit: Payer: Self-pay

## 2021-08-01 ENCOUNTER — Ambulatory Visit (INDEPENDENT_AMBULATORY_CARE_PROVIDER_SITE_OTHER): Payer: Medicare Other

## 2021-08-01 DIAGNOSIS — I4819 Other persistent atrial fibrillation: Secondary | ICD-10-CM

## 2021-08-01 DIAGNOSIS — R001 Bradycardia, unspecified: Secondary | ICD-10-CM

## 2021-08-01 DIAGNOSIS — I25119 Atherosclerotic heart disease of native coronary artery with unspecified angina pectoris: Secondary | ICD-10-CM | POA: Diagnosis not present

## 2021-08-01 LAB — CUP PACEART INCLINIC DEVICE CHECK
Battery Remaining Longevity: 41 mo
Battery Voltage: 2.97 V
Brady Statistic AP VP Percent: 9.91 %
Brady Statistic AP VS Percent: 64.18 %
Brady Statistic AS VP Percent: 11.06 %
Brady Statistic AS VS Percent: 14.85 %
Brady Statistic RA Percent Paced: 68.06 %
Brady Statistic RV Percent Paced: 20.21 %
Date Time Interrogation Session: 20221123143633
Implantable Lead Implant Date: 20160823
Implantable Lead Implant Date: 20160823
Implantable Lead Location: 753859
Implantable Lead Location: 753860
Implantable Lead Model: 5076
Implantable Lead Model: 5076
Implantable Pulse Generator Implant Date: 20160823
Lead Channel Impedance Value: 361 Ohm
Lead Channel Impedance Value: 399 Ohm
Lead Channel Impedance Value: 399 Ohm
Lead Channel Impedance Value: 418 Ohm
Lead Channel Pacing Threshold Amplitude: 0.5 V
Lead Channel Pacing Threshold Amplitude: 0.75 V
Lead Channel Pacing Threshold Pulse Width: 0.4 ms
Lead Channel Pacing Threshold Pulse Width: 0.4 ms
Lead Channel Sensing Intrinsic Amplitude: 1.625 mV
Lead Channel Sensing Intrinsic Amplitude: 10.25 mV
Lead Channel Setting Pacing Amplitude: 1.5 V
Lead Channel Setting Pacing Amplitude: 2 V
Lead Channel Setting Pacing Pulse Width: 0.4 ms
Lead Channel Setting Sensing Sensitivity: 2.8 mV

## 2021-08-01 NOTE — Patient Instructions (Signed)
Device clinic phone # [phone]  Monday-Friday 8am-5pm.  After hours call [phone].

## 2021-08-01 NOTE — Progress Notes (Signed)
Pacemaker check in clinic with industry.  Patient in office for device testing due to report of possible Pacing on t wave.  Testing completed with ECG back-up and results reviewed by Dr. Quentin Ore.  Patient has very long AV delay, device reprogrammed with MVP turned off.  Also noted was since last check pt has had >1100 episodes of AT/AF, mostly AFl.  Reactive ATP programmed on today with nominal settings.  Patient/ spouse educated on close monitoring and s/s of worsening condition to monitor for and report.  All changes reviewed and approved by Dr. Quentin Ore in office.  Device programmed to maximize longevity Device programmed at appropriate safety margins. Histogram distribution appropriate for patient activity level. Device programmed to optimize intrinsic conduction. Estimated longevity 3 years. Patient enrolled in remote follow-up. Patient education completed.

## 2021-08-10 ENCOUNTER — Other Ambulatory Visit (INDEPENDENT_AMBULATORY_CARE_PROVIDER_SITE_OTHER): Payer: Medicare Other

## 2021-08-10 DIAGNOSIS — I4819 Other persistent atrial fibrillation: Secondary | ICD-10-CM

## 2021-08-10 DIAGNOSIS — I25119 Atherosclerotic heart disease of native coronary artery with unspecified angina pectoris: Secondary | ICD-10-CM

## 2021-08-10 NOTE — Progress Notes (Signed)
Kg   

## 2021-08-19 ENCOUNTER — Other Ambulatory Visit: Payer: Self-pay | Admitting: Internal Medicine

## 2021-08-19 NOTE — Telephone Encounter (Signed)
Requested Prescriptions  Pending Prescriptions Disp Refills  . pantoprazole (PROTONIX) 40 MG tablet [Pharmacy Med Name: PANTOPRAZOLE SOD DR 40 MG TAB] 60 tablet 0    Sig: TAKE 1 TABLET BY MOUTH TWICE A DAY     Gastroenterology: Proton Pump Inhibitors Passed - 08/19/2021  1:38 PM      Passed - Valid encounter within last 12 months    Recent Outpatient Visits          2 months ago Adult hypothyroidism   [facility] Glean Hess, MD      Future Appointments            In 2 days Glean Hess, MD [facility], [facility]

## 2021-08-20 ENCOUNTER — Other Ambulatory Visit: Payer: Self-pay | Admitting: Internal Medicine

## 2021-08-20 DIAGNOSIS — Z1231 Encounter for screening mammogram for malignant neoplasm of breast: Secondary | ICD-10-CM

## 2021-08-21 ENCOUNTER — Encounter: Payer: Self-pay | Admitting: Internal Medicine

## 2021-08-21 ENCOUNTER — Ambulatory Visit (INDEPENDENT_AMBULATORY_CARE_PROVIDER_SITE_OTHER): Payer: Medicare Other | Admitting: Internal Medicine

## 2021-08-21 ENCOUNTER — Other Ambulatory Visit: Payer: Self-pay

## 2021-08-21 VITALS — BP 112/78 | HR 75 | Ht 62.0 in | Wt 120.0 lb

## 2021-08-21 DIAGNOSIS — R7303 Prediabetes: Secondary | ICD-10-CM | POA: Diagnosis not present

## 2021-08-21 DIAGNOSIS — I25119 Atherosclerotic heart disease of native coronary artery with unspecified angina pectoris: Secondary | ICD-10-CM

## 2021-08-21 DIAGNOSIS — E7800 Pure hypercholesterolemia, unspecified: Secondary | ICD-10-CM

## 2021-08-21 DIAGNOSIS — I10 Essential (primary) hypertension: Secondary | ICD-10-CM

## 2021-08-21 DIAGNOSIS — M5432 Sciatica, left side: Secondary | ICD-10-CM | POA: Insufficient documentation

## 2021-08-21 DIAGNOSIS — E039 Hypothyroidism, unspecified: Secondary | ICD-10-CM | POA: Diagnosis not present

## 2021-08-21 DIAGNOSIS — K219 Gastro-esophageal reflux disease without esophagitis: Secondary | ICD-10-CM

## 2021-08-21 MED ORDER — PANTOPRAZOLE SODIUM 40 MG PO TBEC: 40.0000 mg | DELAYED_RELEASE_TABLET | Freq: Two times a day (BID) | ORAL | 1 refills | Status: DC

## 2021-08-21 MED ORDER — LEVOTHYROXINE SODIUM 88 MCG PO TABS: 88.0000 ug | ORAL_TABLET | Freq: Every day | ORAL | 1 refills | Status: DC

## 2021-08-21 MED ORDER — GABAPENTIN 100 MG PO CAPS: 200.0000 mg | ORAL_CAPSULE | Freq: Every day | ORAL | 1 refills | Status: DC

## 2021-08-21 NOTE — Progress Notes (Signed)
Date:  08/21/2021   Name:  [name]   DOB:  December 07, 1934   MRN:  [id number]   Chief Complaint: Hypertension, Hyperlipidemia, Hypothyroidism, and Diabetes  Hypertension This is a chronic problem. The problem is controlled. Associated symptoms include shortness of breath. Pertinent negatives include no chest pain or headaches. Past treatments include angiotensin blockers, ACE inhibitors, diuretics and beta blockers. The current treatment provides significant improvement. Hypertensive end-organ damage includes heart failure. Identifiable causes of hypertension include a thyroid problem.  Hyperlipidemia This is a chronic problem. The problem is controlled. Associated symptoms include shortness of breath. Pertinent negatives include no chest pain. Current antihyperlipidemic treatment includes statins.  Thyroid Problem Presents for follow-up visit. Patient reports no anxiety, constipation, diarrhea or fatigue. The symptoms have been stable. Her past medical history is significant for heart failure and hyperlipidemia.  Diabetes She presents for her follow-up diabetic visit. Diabetes type: prediabetes. Pertinent negatives for hypoglycemia include no dizziness, headaches or nervousness/anxiousness. Pertinent negatives for diabetes include no chest pain and no fatigue.  Gastroesophageal Reflux She complains of belching and heartburn. She reports no abdominal pain, no chest pain, no coughing or no wheezing. This is a recurrent problem. The problem occurs occasionally. Pertinent negatives include no fatigue. She has tried a PPI for the symptoms.   Lab Results  Component Value Date   NA 141 03/09/2021   K 4.0 03/09/2021   CO2 36 (H) 03/09/2021   GLUCOSE 170 (H) 03/09/2021   BUN 21 03/09/2021   CREATININE 1.01 (H) 03/09/2021   CALCIUM 8.9 03/09/2021   GFRNONAA 55 (L) 03/09/2021   Lab Results  Component Value Date   TRIG 48 02/22/2019   Lab Results  Component Value Date   TSH 0.824  07/20/2020   Lab Results  Component Value Date   HGBA1C 6.0 (H) 07/18/2018   Lab Results  Component Value Date   WBC 7.4 09/13/2020   HGB 12.2 09/13/2020   HCT 35.4 09/13/2020   MCV 98 (H) 09/13/2020   PLT 154 09/13/2020   Lab Results  Component Value Date   ALT 14 07/20/2020   AST 15 07/20/2020   ALKPHOS 76 07/20/2020   BILITOT 0.4 07/20/2020   No results found for: 25OHVITD2, 25OHVITD3, VD25OH   Review of Systems  Constitutional:  Negative for chills, fatigue and fever.  HENT:  Negative for trouble swallowing.   Respiratory:  Positive for shortness of breath. Negative for cough, chest tightness and wheezing.   Cardiovascular:  Negative for chest pain and leg swelling.  Gastrointestinal:  Positive for heartburn. Negative for abdominal pain, constipation and diarrhea.  Genitourinary:  Negative for dysuria.  Neurological:  Negative for dizziness, light-headedness and headaches.  Psychiatric/Behavioral:  Negative for dysphoric mood and sleep disturbance. The patient is not nervous/anxious.    Patient Active Problem List   Diagnosis Date Noted   Aortoiliac occlusive disease ([place]) 05/22/2021   Cochlear implant status 05/22/2021   Acquired thrombophilia ([place]) 08/11/2020   CRI (chronic renal insufficiency), stage 2 (mild) 08/11/2020   Primary insomnia 07/31/2019   Osteoporosis, postmenopausal 01/23/2018   Coronary artery disease involving native coronary artery of native heart with angina pectoris (HCC) 01/13/2017   Chronic diastolic CHF (congestive heart failure) (HCC)    S/P placement of cardiac pacemaker 05/02/15, medtronic 05/03/2015   Bradycardia 05/03/2015   Presence of cardiac pacemaker 05/03/2015   S/P mitral valve replacement with bioprosthetic valve, tricuspid valve repair, maze procedure and CABG x1 04/25/2015   S/P tricuspid valve  repair 04/25/2015   S/P Maze operation for atrial fibrillation 04/25/2015   S/P CABG x 1 04/25/2015   Pulmonary hypertension (HCC)     Tricuspid regurgitation    Chronic combined systolic and diastolic CHF (congestive heart failure) (HCC)    Chronic systolic heart failure ([place]) 11/10/2014   Essential hypertension 11/10/2014   Mitral regurgitation 11/10/2014   Hyperlipidemia 11/10/2014   Disorder of mitral valve 11/10/2014   Atrial fibrillation ([place]) 11/10/2014   Acid reflux 05/10/2014   Prediabetes 05/08/2014   Auditory vertigo 05/08/2014   Adult hypothyroidism 05/08/2014   Hypercholesterolemia 05/08/2014   H/O adenomatous polyp of colon 05/08/2014   Stage 4 very severe COPD by GOLD classification ([place]) 03/28/2014    Allergies  Allergen Reactions   Ace Inhibitors Cough and Other (See Comments)   Amoxicillin-Pot Clavulanate Swelling and Other (See Comments)    SWOLLEN JOINTS  Has patient had a PCN reaction causing immediate rash, facial/tongue/throat swelling, SOB or lightheadedness with hypotension: Yes Has patient had a PCN reaction causing severe rash involving mucus membranes or skin necrosis: No Has patient had a PCN reaction that required hospitalization No Has patient had a PCN reaction occurring within the last 10 years: No If all of the above answers are "NO", then may proceed with Cephalosporin   Penicillins Swelling and Other (See Comments)    SWOLLEN JOINTS  Has patient had a PCN reaction causing immediate rash, facial/tongue/throat swelling, SOB or lightheadedness with hypotension: Yes Has patient had a PCN reaction causing severe rash involving mucus membranes or skin necrosis: No Has patient had a PCN reaction that required hospitalization No Has patient had a PCN reaction occurring within the last 10 years: No If all of the above answers are "NO", then may proceed with Cephalosporin use.  Swollen joints   Rosuvastatin Other (See Comments)   Nifedipine Other (See Comments)    UNSPECIFIED UNSPECIFIED    Propranolol Other (See Comments)    UNSPECIFIED UNSPECIFIED    Diazepam Other (See  Comments)    Made her "hyper"    Meperidine Other (See Comments)    Made her "hyper"     Propoxyphene Other (See Comments)    Made her "hyper"      Past Surgical History:  Procedure Laterality Date   APPENDECTOMY     APPLICATION OF A-CELL OF CHEST/ABDOMEN N/A 11/21/2015   Procedure: APPLICATION OF A-CELL OF CHEST/ABDOMEN;  Surgeon: Loel Lofty Dillingham, DO;  Location: [place];  Service: Plastics;  Laterality: N/A;   APPLICATION OF A-CELL OF CHEST/ABDOMEN Right 12/21/2015   Procedure: APPLICATION OF A-CELL OF RIGHT CHEST;  Surgeon: Loel Lofty Dillingham, DO;  Location: [place];  Service: Plastics;  Laterality: Right;   APPLICATION OF A-CELL OF CHEST/ABDOMEN Right 01/11/2016   Procedure: APPLICATION OF A-CELL OF RIGHT CHEST;  Surgeon: Loel Lofty Dillingham, DO;  Location: [place];  Service: Plastics;  Laterality: Right;   APPLICATION OF A-CELL OF CHEST/ABDOMEN Right 02/12/2016   Procedure: APPLICATION OF A-CELL TO RIGHT CHEST WOUND;  Surgeon: Loel Lofty Dillingham, DO;  Location: [place];  Service: Plastics;  Laterality: Right;   APPLICATION OF WOUND VAC N/A 05/09/2015   Procedure: APPLICATION OF WOUND VAC;  Surgeon: Rexene Alberts, MD;  Location: [place];  Service: Thoracic;  Laterality: N/A;   APPLICATION OF WOUND VAC N/A 11/13/2015   Procedure: APPLICATION OF WOUND VAC;  Surgeon: Rexene Alberts, MD;  Location: [place];  Service: Thoracic;  Laterality: N/A;   APPLICATION OF WOUND VAC N/A  11/21/2015   Procedure: APPLICATION OF WOUND VAC;  Surgeon: Loel Lofty Dillingham, DO;  Location: [place];  Service: Plastics;  Laterality: N/A;   APPLICATION OF WOUND VAC Right 12/21/2015   Procedure: APPLICATION OF WOUND VAC to right chest wall ;  Surgeon: Loel Lofty Dillingham, DO;  Location: [place];  Service: Plastics;  Laterality: Right;   APPLICATION OF WOUND VAC Right 01/11/2016   Procedure: APPLICATION OF WOUND VAC RIGHT CHEST ;  Surgeon: Loel Lofty Dillingham, DO;  Location: [place];  Service: Plastics;  Laterality: Right;    APPLICATION OF WOUND VAC Right 01/24/2016   Procedure: APPLICATION OF WOUND VAC RIGHT CHEST WALL;  Surgeon: Loel Lofty Dillingham, DO;  Location: [place];  Service: Plastics;  Laterality: Right;   APPLICATION OF WOUND VAC Right 02/12/2016   Procedure: RE-APPLICATION OF WOUND VAC TO RIGHT CHEST WOUND;  Surgeon: Loel Lofty Dillingham, DO;  Location: [place];  Service: Plastics;  Laterality: Right;   BIV UPGRADE N/A 06/07/2019   Procedure: BIV UPGRADE;  Surgeon: Deboraha Sprang, MD;  Location: [place] [facility];  Service: Cardiovascular;  Laterality: N/A;   BREAST BIOPSY Left 11/25/06   neg   BREAST BIOPSY Left 01/20/12   /clip-neg   CARDIAC CATHETERIZATION  11/2013   [facility]   CARDIAC CATHETERIZATION  10/2014   [facility]   CARDIAC VALVE REPLACEMENT     CARDIOVERSION N/A 07/20/2018   Procedure: CARDIOVERSION;  Surgeon: Wellington Hampshire, MD;  Location: [facility];  Service: Cardiovascular;  Laterality: N/A;   CARDIOVERSION N/A 09/18/2020   Procedure: CARDIOVERSION;  Surgeon: Minna Merritts, MD;  Location: [facility];  Service: Cardiovascular;  Laterality: N/A;   CATARACT EXTRACTION W/ INTRAOCULAR LENS  IMPLANT, BILATERAL Bilateral 2014   CLIPPING OF ATRIAL APPENDAGE N/A 04/25/2015   Procedure: CLIPPING OF ATRIAL APPENDAGE;  Surgeon: Rexene Alberts, MD;  Location: [place];  Service: Open Heart Surgery;  Laterality: N/A;   COCHLEAR IMPLANT Left 2005?   COLONOSCOPY WITH PROPOFOL N/A 01/20/2018   Procedure: COLONOSCOPY WITH PROPOFOL;  Surgeon: Toledo, Benay Pike, MD;  Location: [facility];  Service: Gastroenterology;  Laterality: N/A;   CORONARY ANGIOPLASTY     CORONARY ARTERY BYPASS GRAFT N/A 04/25/2015   Procedure: CORONARY ARTERY BYPASS GRAFTING (CABG) x ONE, using left internal mammary artery;  Surgeon: Rexene Alberts, MD;  Location: [place];  Service: Open Heart Surgery;  Laterality: N/A;   DILATION AND CURETTAGE OF UTERUS  "several before hysterectomy"   EP IMPLANTABLE DEVICE N/A 05/02/2015   Procedure: Pacemaker  Implant;  Surgeon: Thompson Grayer, MD;  Location: [place] [facility];  Service: Cardiovascular;  Laterality: N/A;   ESOPHAGOGASTRODUODENOSCOPY (EGD) WITH PROPOFOL N/A 01/20/2018   Procedure: ESOPHAGOGASTRODUODENOSCOPY (EGD) WITH PROPOFOL;  Surgeon: Toledo, Benay Pike, MD;  Location: [facility];  Service: Gastroenterology;  Laterality: N/A;   EYE SURGERY     I & D EXTREMITY Right 12/06/2015   Procedure: IRRIGATION AND DEBRIDEMENT RIGHT CHEST WALL WITH ACELL PLACEMENT AND VAC;  Surgeon: Loel Lofty Dillingham, DO;  Location: [place];  Service: Plastics;  Laterality: Right;   INCISION AND DRAINAGE OF WOUND N/A 11/21/2015   Procedure: IRRIGATION AND DEBRIDEMENT WOUND;  Surgeon: Loel Lofty Dillingham, DO;  Location: [place];  Service: Plastics;  Laterality: N/A;   INCISION AND DRAINAGE OF WOUND Right 12/21/2015   Procedure: IRRIGATION AND DEBRIDEMENT right chest wall WOUND;  Surgeon: Loel Lofty Dillingham, DO;  Location: [place];  Service: Plastics;  Laterality: Right;  right chest wall  INCISION AND DRAINAGE OF WOUND Right 01/24/2016   Procedure: IRRIGATION AND DEBRIDEMENT RIGHT CHEST WALL WOUND;  Surgeon: Loel Lofty Dillingham, DO;  Location: [place];  Service: Plastics;  Laterality: Right;   INCISION AND DRAINAGE OF WOUND Right 03/28/2016   Procedure: IRRIGATION AND DEBRIDEMENT RIGHT CHEST WALL WOUND WITH A Cell Placement;  Surgeon: Loel Lofty Dillingham, DO;  Location: [place];  Service: Plastics;  Laterality: Right;   INCISION AND DRAINAGE OF WOUND Right 05/17/2016   Procedure: IRRIGATION AND DEBRIDEMENT OF RIGHT CHEST WOUND WITH A CELL PLACEMENT;  Surgeon: Loel Lofty Dillingham, DO;  Location: [place];  Service: Plastics;  Laterality: Right;   INSERT / REPLACE / REMOVE PACEMAKER     IRRIGATION AND DEBRIDEMENT OF WOUND WITH SPLIT THICKNESS SKIN GRAFT Right 01/11/2016   Procedure: IRRIGATION AND DEBRIDEMENT OF RIGHT CHEST WOUND ;  Surgeon: Loel Lofty Dillingham, DO;  Location: [place];  Service: Plastics;  Laterality: Right;    MASTECTOMY, PARTIAL Right 2002   positive, partial   MAZE N/A 04/25/2015   Procedure: MAZE;  Surgeon: Rexene Alberts, MD;  Location: [place];  Service: Open Heart Surgery;  Laterality: N/A;   MITRAL VALVE REPAIR N/A 04/25/2015   Procedure: MITRAL VALVE  REPLACEMENT using a 27 mm Edwards Perimount Magna Mitral Ease Valve;  Surgeon: Rexene Alberts, MD;  Location: [place];  Service: Open Heart Surgery;  Laterality: N/A;   RIGHT HEART CATH N/A 02/08/2019   Procedure: RIGHT HEART CATH;  Surgeon: Wellington Hampshire, MD;  Location: [place] [facility];  Service: Cardiovascular;  Laterality: N/A;   SKIN SPLIT GRAFT Right 02/12/2016   Procedure: IRRIGATION AND DEBRIDEMENT RIGHT CHEST WOUND;  Surgeon: Loel Lofty Dillingham, DO;  Location: [place];  Service: Plastics;  Laterality: Right;   STERNAL WIRES REMOVAL N/A 06/05/2015   Procedure: STERNAL WIRES REMOVAL;  Surgeon: Rexene Alberts, MD;  Location: [place];  Service: Thoracic;  Laterality: N/A;   STERNAL WOUND DEBRIDEMENT N/A 05/09/2015   Procedure: STERNAL WOUND DEBRIDEMENT;  Surgeon: Rexene Alberts, MD;  Location: [place];  Service: Thoracic;  Laterality: N/A;   STERNAL WOUND DEBRIDEMENT N/A 11/13/2015   Procedure: Excisional drainage of RIGHT Chest wall mass and breast mass ;  Surgeon: Rexene Alberts, MD;  Location: [place];  Service: Thoracic;  Laterality: N/A;   TEE WITHOUT CARDIOVERSION N/A 04/25/2015   Procedure: TRANSESOPHAGEAL ECHOCARDIOGRAM (TEE);  Surgeon: Rexene Alberts, MD;  Location: [place];  Service: Open Heart Surgery;  Laterality: N/A;   TONSILLECTOMY     TRAM Right 11/18/2015   Procedure: VRAM Vertical Rectus Abdominus Muscle Flap;  Surgeon: Loel Lofty Dillingham, DO;  Location: [place];  Service: Plastics;  Laterality: Right;  RIght Back   TRICUSPID VALVE REPLACEMENT N/A 04/25/2015   Procedure: TRICUSPID VALVE REPAIR;  Surgeon: Rexene Alberts, MD;  Location: [place];  Service: Open Heart Surgery;  Laterality: N/A;   VAGINAL HYSTERECTOMY      Social History    Tobacco Use   Smoking status: Former    Packs/day: 1.50    Years: 40.00    Pack years: 60.00    Types: Cigarettes    Quit date: 04/20/1998    Years since quitting: 23.3   Smokeless tobacco: Never  Vaping Use   Vaping Use: Never used  Substance Use Topics   Alcohol use: Yes    Comment: 11/10/2015 "glass of wine a few times/year, if that"   Drug use: No  Medication list has been reviewed and updated.  Current Meds  Medication Sig   acetaminophen (TYLENOL) 500 MG tablet Take 1,000 mg by mouth every 6 (six) hours as needed (for pain.).   AMBULATORY NON FORMULARY MEDICATION [employer] at Home Oxygen machine with tubing. DX: COPD DX Code: J44.9   amiodarone (PACERONE) 200 MG tablet TAKE 1 TABLET BY MOUTH EVERY DAY   bisoprolol (ZEBETA) 5 MG tablet Take 0.5 tablets (2.5 mg total) by mouth daily.   diphenhydrAMINE (BENADRYL) 25 mg capsule Take 25 mg by mouth every 6 (six) hours as needed for allergies.   docusate sodium (COLACE) 100 MG capsule Take 100 mg by mouth 2 (two) times daily as needed (constipation.).   ENTRESTO 49-51 MG TAKE 1 TABLET (49/51 MG) BY MOUTH TWICE DAILY *DOSE INCREASE*   furosemide (LASIX) 20 MG tablet Take 2 tablets (40 mg) in the morning and 1 tablet (20) mg in the evening.   gabapentin (NEURONTIN) 100 MG capsule Take 200 mg by mouth at bedtime.    ipratropium-albuterol (DUONEB) 0.5-2.5 (3) MG/3ML SOLN Inhale 3 mLs into the lungs every 6 (six) hours as needed.   levothyroxine (SYNTHROID, LEVOTHROID) 88 MCG tablet Take 88 mcg by mouth daily before breakfast.   Multiple Vitamins-Minerals (PRESERVISION AREDS 2 PO) Take 1 tablet by mouth 2 (two) times daily.    OXYGEN Inhale 2 L into the lungs continuous.   OXYGEN Inhale 2 L into the lungs.   pantoprazole (PROTONIX) 40 MG tablet TAKE 1 TABLET BY MOUTH TWICE A DAY   simvastatin (ZOCOR) 10 MG tablet TAKE 1 TABLET BY MOUTH EVERYDAY AT BEDTIME   spironolactone (ALDACTONE) 25 MG tablet TAKE 1 TABLET BY MOUTH EVERY DAY  *DOSE INCREASE*   warfarin (COUMADIN) 2.5 MG tablet TAKE AS DIRECTED PER COUMADIN CLINIC    PHQ 2/9 Scores 05/22/2021 07/03/2015  PHQ - 2 Score 0 0  PHQ- 9 Score 1 3    GAD 7 : Generalized Anxiety Score 05/22/2021  Nervous, Anxious, on Edge 0  Control/stop worrying 0  Worry too much - different things 0  Trouble relaxing 0  Restless 0  Easily annoyed or irritable 0  Afraid - awful might happen 0  Total GAD 7 Score 0    BP Readings from Last 3 Encounters:  08/21/21 112/78  07/20/21 (!) 148/70  05/22/21 140/64    Physical Exam Constitutional:      Appearance: Normal appearance.  Cardiovascular:     Rate and Rhythm: Normal rate and regular rhythm.  Pulmonary:     Effort: Pulmonary effort is normal.     Breath sounds: No wheezing or rhonchi.  Abdominal:     General: Abdomen is flat.     Palpations: Abdomen is soft.     Tenderness: There is no abdominal tenderness.  Musculoskeletal:     Cervical back: Normal range of motion. No tenderness.     Right lower leg: No edema.     Left lower leg: No edema.  Lymphadenopathy:     Cervical: No cervical adenopathy.  Skin:    General: Skin is warm.  Neurological:     General: No focal deficit present.     Mental Status: She is alert.  Psychiatric:        Mood and Affect: Mood normal.        Behavior: Behavior normal.    Wt Readings from Last 3 Encounters:  08/21/21 120 lb (54.4 kg)  07/20/21 123 lb (55.8 kg)  05/22/21 122 lb (55.3  kg)    BP 112/78   Pulse 75   Ht 5\' 2"  (1.575 m)   Wt 120 lb (54.4 kg)   SpO2 90%   BMI 21.95 kg/m   Assessment and Plan: 1. Essential hypertension With CHF; BP controlled and followed by Cardiology - Comprehensive metabolic panel  2. Adult hypothyroidism Supplemented; check labs - TSH + free T4 - levothyroxine (SYNTHROID) 88 MCG tablet; Take 1 tablet (88 mcg total) by mouth daily before breakfast.  Dispense: 90 tablet; Refill: 1  3. Prediabetes Continue to limit sweets; will  advise on follow up when labs return - Hemoglobin A1c - Comprehensive metabolic panel  4. Hypercholesterolemia Tolerating statin medication without side effects at this time LDL is at goal of < 70 on current dose Continue same therapy without change at this time. - Lipid panel  5. Sciatica of left side Chronic sx with occasional flares - gabapentin (NEURONTIN) 100 MG capsule; Take 2 capsules (200 mg total) by mouth at bedtime.  Dispense: 180 capsule; Refill: 1  6. Gastroesophageal reflux disease, unspecified whether esophagitis present Symptoms well controlled on daily PPI No red flag signs such as weight loss, n/v, melena Will continue bid Protonix. - pantoprazole (PROTONIX) 40 MG tablet; Take 1 tablet (40 mg total) by mouth 2 (two) times daily.  Dispense: 180 tablet; Refill: 1   Partially dictated using Editor, commissioning. Any errors are unintentional.  Halina Maidens, MD [facility]  08/21/2021

## 2021-08-22 LAB — COMPREHENSIVE METABOLIC PANEL
ALT: 15 IU/L (ref 0–32)
AST: 17 IU/L (ref 0–40)
Albumin/Globulin Ratio: 1.6 (ref 1.2–2.2)
Albumin: 3.8 g/dL (ref 3.6–4.6)
Alkaline Phosphatase: 70 IU/L (ref 44–121)
BUN/Creatinine Ratio: 22 (ref 12–28)
BUN: 22 mg/dL (ref 8–27)
Bilirubin Total: 0.5 mg/dL (ref 0.0–1.2)
CO2: 34 mmol/L — ABNORMAL HIGH (ref 20–29)
Calcium: 9.1 mg/dL (ref 8.7–10.3)
Chloride: 95 mmol/L — ABNORMAL LOW (ref 96–106)
Creatinine, Ser: 1.01 mg/dL — ABNORMAL HIGH (ref 0.57–1.00)
Globulin, Total: 2.4 g/dL (ref 1.5–4.5)
Glucose: 113 mg/dL — ABNORMAL HIGH (ref 70–99)
Potassium: 3.2 mmol/L — ABNORMAL LOW (ref 3.5–5.2)
Sodium: 145 mmol/L — ABNORMAL HIGH (ref 134–144)
Total Protein: 6.2 g/dL (ref 6.0–8.5)
eGFR: 55 mL/min/{1.73_m2} — ABNORMAL LOW (ref >59)

## 2021-08-22 LAB — TSH+FREE T4
Free T4: 2.3 ng/dL — ABNORMAL HIGH (ref 0.82–1.77)
TSH: 1.39 u[IU]/mL (ref 0.450–4.500)

## 2021-08-22 LAB — HEMOGLOBIN A1C
Est. average glucose Bld gHb Est-mCnc: 123 mg/dL
Hgb A1c MFr Bld: 5.9 % — ABNORMAL HIGH (ref 4.8–5.6)

## 2021-08-22 LAB — LIPID PANEL
Chol/HDL Ratio: 3.3 ratio (ref 0.0–4.4)
Cholesterol, Total: 153 mg/dL (ref 100–199)
HDL: 46 mg/dL (ref >39)
LDL Chol Calc (NIH): 92 mg/dL (ref 0–99)
Triglycerides: 80 mg/dL (ref 0–149)
VLDL Cholesterol Cal: 15 mg/dL (ref 5–40)

## 2021-09-05 ENCOUNTER — Ambulatory Visit (INDEPENDENT_AMBULATORY_CARE_PROVIDER_SITE_OTHER): Payer: Medicare Other

## 2021-09-05 ENCOUNTER — Other Ambulatory Visit: Payer: Self-pay

## 2021-09-05 DIAGNOSIS — Z5181 Encounter for therapeutic drug level monitoring: Secondary | ICD-10-CM

## 2021-09-05 DIAGNOSIS — I340 Nonrheumatic mitral (valve) insufficiency: Secondary | ICD-10-CM

## 2021-09-05 DIAGNOSIS — Z953 Presence of xenogenic heart valve: Secondary | ICD-10-CM

## 2021-09-05 LAB — POCT INR: INR: 6.3 — AB (ref 2.0–3.0)

## 2021-09-05 NOTE — Patient Instructions (Signed)
-   skip warfarin for the next 3 days (today, tomorrow, and Friday), then - on Saturday, START NEW dosage of warfarin 1 tablet every day - Recheck INR in 3 weeks.

## 2021-09-06 ENCOUNTER — Ambulatory Visit (INDEPENDENT_AMBULATORY_CARE_PROVIDER_SITE_OTHER): Payer: Medicare Other

## 2021-09-06 DIAGNOSIS — I5022 Chronic systolic (congestive) heart failure: Secondary | ICD-10-CM

## 2021-09-07 LAB — CUP PACEART REMOTE DEVICE CHECK
Battery Remaining Longevity: 39 mo
Battery Voltage: 2.96 V
Brady Statistic AP VP Percent: 99.29 %
Brady Statistic AP VS Percent: 0.02 %
Brady Statistic AS VP Percent: 0.66 %
Brady Statistic AS VS Percent: 0.03 %
Brady Statistic RA Percent Paced: 99.12 %
Brady Statistic RV Percent Paced: 99.93 %
Date Time Interrogation Session: 20221230115830
Implantable Lead Implant Date: 20160823
Implantable Lead Implant Date: 20160823
Implantable Lead Location: 753859
Implantable Lead Location: 753860
Implantable Lead Model: 5076
Implantable Lead Model: 5076
Implantable Pulse Generator Implant Date: 20160823
Lead Channel Impedance Value: 342 Ohm
Lead Channel Impedance Value: 361 Ohm
Lead Channel Impedance Value: 380 Ohm
Lead Channel Impedance Value: 380 Ohm
Lead Channel Pacing Threshold Amplitude: 0.5 V
Lead Channel Pacing Threshold Amplitude: 0.625 V
Lead Channel Pacing Threshold Pulse Width: 0.4 ms
Lead Channel Pacing Threshold Pulse Width: 0.4 ms
Lead Channel Sensing Intrinsic Amplitude: 1.375 mV
Lead Channel Sensing Intrinsic Amplitude: 1.375 mV
Lead Channel Sensing Intrinsic Amplitude: 12.25 mV
Lead Channel Sensing Intrinsic Amplitude: 12.25 mV
Lead Channel Setting Pacing Amplitude: 1.5 V
Lead Channel Setting Pacing Amplitude: 2 V
Lead Channel Setting Pacing Pulse Width: 0.4 ms
Lead Channel Setting Sensing Sensitivity: 2.8 mV

## 2021-09-19 NOTE — Progress Notes (Signed)
Remote pacemaker transmission.   

## 2021-09-21 ENCOUNTER — Other Ambulatory Visit: Payer: Self-pay | Admitting: Cardiovascular Disease

## 2021-09-21 ENCOUNTER — Other Ambulatory Visit: Payer: Self-pay | Admitting: Internal Medicine

## 2021-09-24 ENCOUNTER — Telehealth: Payer: Self-pay | Admitting: Cardiovascular Disease

## 2021-09-24 MED ORDER — FUROSEMIDE 20 MG PO TABS: ORAL_TABLET | ORAL | 0 refills | Status: DC

## 2021-09-24 NOTE — Telephone Encounter (Signed)
Requested Prescriptions   Signed Prescriptions Disp Refills   furosemide (LASIX) 20 MG tablet 270 tablet 0    Sig: Take 2 tablets (40 mg) in the morning and 1 tablet (20) mg in the evening.    Authorizing Provider: Kathlyn [place] A    Ordering User: Raelene Bott, Corin Formisano L

## 2021-09-24 NOTE — Telephone Encounter (Signed)
°*  STAT* If patient is at the pharmacy, call can be transferred to refill team.   1. Which medications need to be refilled? (please list name of each medication and dose if known) furosemide 40 mg am and 20 mg pm   2. Which pharmacy/location (including street and city if local pharmacy) is medication to be sent to?[facility]   3. Do they need a 30 day or 90 day supply? [place]

## 2021-09-25 ENCOUNTER — Other Ambulatory Visit: Payer: Self-pay

## 2021-09-25 ENCOUNTER — Ambulatory Visit
Admission: RE | Admit: 2021-09-25 | Discharge: 2021-09-25 | Disposition: A | Discharge status: Home / Self Care | Payer: Medicare Other | Source: Ambulatory Visit | Attending: Internal Medicine | Admitting: Internal Medicine

## 2021-09-25 DIAGNOSIS — Z1231 Encounter for screening mammogram for malignant neoplasm of breast: Secondary | ICD-10-CM | POA: Diagnosis present

## 2021-09-26 ENCOUNTER — Other Ambulatory Visit: Payer: Self-pay | Admitting: Internal Medicine

## 2021-09-26 ENCOUNTER — Ambulatory Visit (INDEPENDENT_AMBULATORY_CARE_PROVIDER_SITE_OTHER): Payer: Medicare Other

## 2021-09-26 DIAGNOSIS — I340 Nonrheumatic mitral (valve) insufficiency: Secondary | ICD-10-CM | POA: Diagnosis not present

## 2021-09-26 DIAGNOSIS — Z953 Presence of xenogenic heart valve: Secondary | ICD-10-CM

## 2021-09-26 DIAGNOSIS — R928 Other abnormal and inconclusive findings on diagnostic imaging of breast: Secondary | ICD-10-CM

## 2021-09-26 DIAGNOSIS — Z5181 Encounter for therapeutic drug level monitoring: Secondary | ICD-10-CM | POA: Diagnosis not present

## 2021-09-26 DIAGNOSIS — N6320 Unspecified lump in the left breast, unspecified quadrant: Secondary | ICD-10-CM

## 2021-09-26 LAB — POCT INR: INR: 3.1 — AB (ref 2.0–3.0)

## 2021-09-26 NOTE — Patient Instructions (Signed)
-   try to have some greens in the next day or 2 - continue dosage of warfarin 1 tablet every day - Recheck INR in 4 weeks.

## 2021-10-01 ENCOUNTER — Ambulatory Visit (INDEPENDENT_AMBULATORY_CARE_PROVIDER_SITE_OTHER): Payer: Medicare Other

## 2021-10-01 DIAGNOSIS — Z0000 Encounter for general adult medical examination without abnormal findings: Secondary | ICD-10-CM | POA: Diagnosis not present

## 2021-10-01 NOTE — Patient Instructions (Signed)
[name] , Thank you for taking time to come for your Medicare Wellness Visit. I appreciate your ongoing commitment to your health goals. Please review the following plan we discussed and let me know if I can assist you in the future.   Screening recommendations/referrals: Colonoscopy: no  longer required Mammogram: done 09/25/21 Bone Density: done 10/17/20 Recommended yearly ophthalmology/optometry visit for glaucoma screening and checkup Recommended yearly dental visit for hygiene and checkup  Vaccinations: Influenza vaccine: done 05/22/21 Pneumococcal vaccine: done 07/22/16 Tdap vaccine: done 07/18/17 Shingles vaccine: done 04/02/18 & 07/14/18   Covid-19:done 09/16/19, 10/07/19, 05/23/20 & 07/05/21  Advanced directives: Advance directive discussed with you today. I have provided a copy for you to complete at home and have notarized. Once this is complete please bring a copy in to our office so we can scan it into your chart.   Conditions/risks identified: Recommend continuing fall prevention in the home  Next appointment: Follow up in one year for your annual wellness visit    Preventive Care 65 Years and Older, Female Preventive care refers to lifestyle choices and visits with your health care provider that can promote health and wellness. What does preventive care include? A yearly physical exam. This is also called an annual well check. Dental exams once or twice a year. Routine eye exams. Ask your health care provider how often you should have your eyes checked. Personal lifestyle choices, including: Daily care of your teeth and gums. Regular physical activity. Eating a healthy diet. Avoiding tobacco and drug use. Limiting alcohol use. Practicing safe sex. Taking low-dose aspirin every day. Taking vitamin and mineral supplements as recommended by your health care provider. What happens during an annual well check? The services and screenings done by your health care provider  during your annual well check will depend on your age, overall health, lifestyle risk factors, and family history of disease. Counseling  Your health care provider may ask you questions about your: Alcohol use. Tobacco use. Drug use. Emotional well-being. Home and relationship well-being. Sexual activity. Eating habits. History of falls. Memory and ability to understand (cognition). Work and work Statistician. Reproductive health. Screening  You may have the following tests or measurements: Height, weight, and BMI. Blood pressure. Lipid and cholesterol levels. These may be checked every 5 years, or more frequently if you are over 81 years old. Skin check. Lung cancer screening. You may have this screening every year starting at age 84 if you have a 30-pack-year history of smoking and currently smoke or have quit within the past 15 years. Fecal occult blood test (FOBT) of the stool. You may have this test every year starting at age 28. Flexible sigmoidoscopy or colonoscopy. You may have a sigmoidoscopy every 5 years or a colonoscopy every 10 years starting at age 15. Hepatitis C blood test. Hepatitis B blood test. Sexually transmitted disease (STD) testing. Diabetes screening. This is done by checking your blood sugar (glucose) after you have not eaten for a while (fasting). You may have this done every 1-3 years. Bone density scan. This is done to screen for osteoporosis. You may have this done starting at age 50. Mammogram. This may be done every 1-2 years. Talk to your health care provider about how often you should have regular mammograms. Talk with your health care provider about your test results, treatment options, and if necessary, the need for more tests. Vaccines  Your health care provider may recommend certain vaccines, such as: Influenza vaccine. This is recommended every  year. Tetanus, diphtheria, and acellular pertussis (Tdap, Td) vaccine. You may need a Td booster every  10 years. Zoster vaccine. You may need this after age 74. Pneumococcal 13-valent conjugate (PCV13) vaccine. One dose is recommended after age 52. Pneumococcal polysaccharide (PPSV23) vaccine. One dose is recommended after age 51. Talk to your health care provider about which screenings and vaccines you need and how often you need them. This information is not intended to replace advice given to you by your health care provider. Make sure you discuss any questions you have with your health care provider. Document Released: 09/22/2015 Document Revised: 05/15/2016 Document Reviewed: 06/27/2015 Elsevier Interactive Patient Education  2017 [employer] Prevention in the Home Falls can cause injuries. They can happen to people of all ages. There are many things you can do to make your home safe and to help prevent falls. What can I do on the outside of my home? Regularly fix the edges of walkways and driveways and fix any cracks. Remove anything that might make you trip as you walk through a door, such as a raised step or threshold. Trim any bushes or trees on the path to your home. Use bright outdoor lighting. Clear any walking paths of anything that might make someone trip, such as rocks or tools. Regularly check to see if handrails are loose or broken. Make sure that both sides of any steps have handrails. Any raised decks and porches should have guardrails on the edges. Have any leaves, snow, or ice cleared regularly. Use sand or salt on walking paths during winter. Clean up any spills in your garage right away. This includes oil or grease spills. What can I do in the bathroom? Use night lights. Install grab bars by the toilet and in the tub and shower. Do not use towel bars as grab bars. Use non-skid mats or decals in the tub or shower. If you need to sit down in the shower, use a plastic, non-slip stool. Keep the floor dry. Clean up any water that spills on the floor as soon as it  happens. Remove soap buildup in the tub or shower regularly. Attach bath mats securely with double-sided non-slip rug tape. Do not have throw rugs and other things on the floor that can make you trip. What can I do in the bedroom? Use night lights. Make sure that you have a light by your bed that is easy to reach. Do not use any sheets or blankets that are too big for your bed. They should not hang down onto the floor. Have a firm chair that has side arms. You can use this for support while you get dressed. Do not have throw rugs and other things on the floor that can make you trip. What can I do in the kitchen? Clean up any spills right away. Avoid walking on wet floors. Keep items that you use a lot in easy-to-reach places. If you need to reach something above you, use a strong step stool that has a grab bar. Keep electrical cords out of the way. Do not use floor polish or wax that makes floors slippery. If you must use wax, use non-skid floor wax. Do not have throw rugs and other things on the floor that can make you trip. What can I do with my stairs? Do not leave any items on the stairs. Make sure that there are handrails on both sides of the stairs and use them. Fix handrails that are broken or  loose. Make sure that handrails are as long as the stairways. Check any carpeting to make sure that it is firmly attached to the stairs. Fix any carpet that is loose or worn. Avoid having throw rugs at the top or bottom of the stairs. If you do have throw rugs, attach them to the floor with carpet tape. Make sure that you have a light switch at the top of the stairs and the bottom of the stairs. If you do not have them, ask someone to add them for you. What else can I do to help prevent falls? Wear shoes that: Do not have high heels. Have rubber bottoms. Are comfortable and fit you well. Are closed at the toe. Do not wear sandals. If you use a stepladder: Make sure that it is fully opened.  Do not climb a closed stepladder. Make sure that both sides of the stepladder are locked into place. Ask someone to hold it for you, if possible. Clearly mark and make sure that you can see: Any grab bars or handrails. First and last steps. Where the edge of each step is. Use tools that help you move around (mobility aids) if they are needed. These include: Canes. Walkers. Scooters. Crutches. Turn on the lights when you go into a dark area. Replace any light bulbs as soon as they burn out. Set up your furniture so you have a clear path. Avoid moving your furniture around. If any of your floors are uneven, fix them. If there are any pets around you, be aware of where they are. Review your medicines with your doctor. Some medicines can make you feel dizzy. This can increase your chance of falling. Ask your doctor what other things that you can do to help prevent falls. This information is not intended to replace advice given to you by your health care provider. Make sure you discuss any questions you have with your health care provider. Document Released: 06/22/2009 Document Revised: 02/01/2016 Document Reviewed: 09/30/2014 Elsevier Interactive Patient Education  2017 [employer].

## 2021-10-01 NOTE — Progress Notes (Addendum)
Subjective:   [name] is a 87 y.o. female who presents for Medicare Annual (Subsequent) preventive examination.  Virtual Visit via Telephone Note  I connected with  [name] on 10/01/21 at  8:00 AM EST by telephone and verified that I am speaking with the correct person using two identifiers.  Location: Patient: home Provider: [facility] Persons participating in the virtual visit: [place]   I discussed the limitations, risks, security and privacy concerns of performing an evaluation and management service by telephone and the availability of in person appointments. The patient expressed understanding and agreed to proceed.  Interactive audio and video telecommunications were attempted between this nurse and patient, however failed, due to patient having technical difficulties OR patient did not have access to video capability.  We continued and completed visit with audio only.  Some vital signs may be absent or patient reported.   Clemetine Marker, LPN   Review of Systems     Cardiac Risk Factors include: advanced age (>41men, >37 women);diabetes mellitus;dyslipidemia;hypertension;sedentary lifestyle     Objective:    There were no vitals filed for this visit. There is no height or weight on file to calculate BMI.  Advanced Directives 10/01/2021 06/07/2019 02/22/2019 02/22/2019 02/08/2019 02/05/2019 01/25/2019  Does Patient Have a Medical Advance Directive? Yes No No Yes Yes Yes Yes  Type of Paramedic of [place];Living will - - Healthcare Power of [place] will Living will  Does patient want to make changes to medical advance directive? - - No - Patient declined - No - Patient declined - No - Patient declined  Copy of Story in Chart? No - copy requested - - No - copy requested No - copy requested - -  Would patient like information on creating a medical advance directive? - No -  Patient declined - - No - Patient declined - No - Patient declined    Current Medications (verified) Outpatient Encounter Medications as of 10/01/2021  Medication Sig   acetaminophen (TYLENOL) 500 MG tablet Take 1,000 mg by mouth every 6 (six) hours as needed (for pain.).   AMBULATORY NON FORMULARY MEDICATION Inogen at Home Oxygen machine with tubing. DX: COPD DX Code: J44.9   amiodarone (PACERONE) 200 MG tablet TAKE 1 TABLET BY MOUTH EVERY DAY   bisoprolol (ZEBETA) 5 MG tablet Take 0.5 tablets (2.5 mg total) by mouth daily.   diphenhydrAMINE (BENADRYL) 25 mg capsule Take 25 mg by mouth every 6 (six) hours as needed for allergies.   docusate sodium (COLACE) 100 MG capsule Take 100 mg by mouth 2 (two) times daily as needed (constipation.).   ENTRESTO 49-51 MG TAKE 1 TABLET (49/51 MG) BY MOUTH TWICE DAILY *DOSE INCREASE*   furosemide (LASIX) 20 MG tablet Take 2 tablets (40 mg) in the morning and 1 tablet (20) mg in the evening.   gabapentin (NEURONTIN) 100 MG capsule Take 2 capsules (200 mg total) by mouth at bedtime.   ipratropium-albuterol (DUONEB) 0.5-2.5 (3) MG/3ML SOLN Inhale 3 mLs into the lungs every 6 (six) hours as needed.   levothyroxine (SYNTHROID) 88 MCG tablet Take 1 tablet (88 mcg total) by mouth daily before breakfast.   Multiple Vitamins-Minerals (PRESERVISION AREDS 2 PO) Take 1 tablet by mouth 2 (two) times daily.    OXYGEN Inhale 2 L into the lungs continuous.   pantoprazole (PROTONIX) 40 MG tablet Take 1 tablet (40 mg total) by mouth 2 (two) times daily.  simvastatin (ZOCOR) 10 MG tablet TAKE 1 TABLET BY MOUTH EVERYDAY AT BEDTIME   spironolactone (ALDACTONE) 25 MG tablet TAKE 1 TABLET BY MOUTH EVERY DAY *DOSE INCREASE*   warfarin (COUMADIN) 2.5 MG tablet TAKE AS DIRECTED PER COUMADIN CLINIC   [DISCONTINUED] OXYGEN Inhale 2 L into the lungs.   No facility-administered encounter medications on file as of 10/01/2021.    Allergies (verified) Ace inhibitors, Amoxicillin-pot  clavulanate, Penicillins, Rosuvastatin, Nifedipine, Propranolol, Diazepam, Meperidine, and Propoxyphene   History: Past Medical History:  Diagnosis Date   Acute on chronic respiratory failure with hypoxia ([place]) 02/04/2019   Added automatically from request for surgery [id number]   Acute on chronic systolic heart failure ([place])    Anxiety    Arthritis    "hands" (11/10/2015)   Breast cancer ([place]) 2002   right   CAD s/p CABG x 1    a. 04/2015 LIMA to LAD   Chronic combined systolic (congestive) and diastolic (congestive) heart failure ([place])    a. 01/2015 Echo: EF 50-55%, Gr2 DD (preop valve surgery); b. 08/2015 Echo: EF 30-35%, diff HK. Gr2 DD; c. 09/2016 Echo: EF 50-55%; d. 07/2018 Echo: EF 20-25%; e. 03/2019 Echo: EF 35-40%, diff HK. Mod red RV fxn, RVSP 34.74mmHg. Mildly dil LA. Nl MV prosthesis. Triv AI.   COPD (chronic obstructive pulmonary disease) (HCC)    Coronary artery disease    GERD (gastroesophageal reflux disease)    History of colon polyps    History of mitral valve replacement    a. 04/2015 s/p 27 mm [facility] Mitral bovine bioprosthetic tissue valve   Hyperlipidemia    Hypertension    Hypothyroidism    Maze operation for AF w/ LAA clipping    a. 04/2015 Complete bilateral atrial lesion set using cryothermy and bipolar radiofrequency ablation with clipping of LA appendage   Meniere disease    Meniere's disease    On home oxygen therapy    "2L at night" (11/10/2015)   PAF (paroxysmal atrial fibrillation) ([place])    Personal history of radiation therapy    Pneumonia ~ 2010   PONV (postoperative nausea and vomiting)    Pt felt like eardrums were gonna pop   Post-surgical complete heart block, symptomatic    a. 04/2015 s/p MDT [id number] Advisa DR MRI DC PPM.   Presence of permanent cardiac pacemaker    Pulmonary hypertension (HCC)    Respiratory failure ([place]) 02/22/2019   Surgical wound, non healing - chest wall 11/10/2015   Tricuspid Regurgitation s/p Repair    a. 04/2015 s/p 26 mm  Edwards mc3 ring annuloplasty   Wound infection 10/13/2015   Superficial sternal wound infection   Past Surgical History:  Procedure Laterality Date   APPENDECTOMY     APPLICATION OF A-CELL OF CHEST/ABDOMEN N/A 11/21/2015   Procedure: APPLICATION OF A-CELL OF CHEST/ABDOMEN;  Surgeon: Loel Lofty Dillingham, DO;  Location: [place];  Service: Plastics;  Laterality: N/A;   APPLICATION OF A-CELL OF CHEST/ABDOMEN Right 12/21/2015   Procedure: APPLICATION OF A-CELL OF RIGHT CHEST;  Surgeon: Loel Lofty Dillingham, DO;  Location: [place];  Service: Plastics;  Laterality: Right;   APPLICATION OF A-CELL OF CHEST/ABDOMEN Right 01/11/2016   Procedure: APPLICATION OF A-CELL OF RIGHT CHEST;  Surgeon: Loel Lofty Dillingham, DO;  Location: [place];  Service: Plastics;  Laterality: Right;   APPLICATION OF A-CELL OF CHEST/ABDOMEN Right 02/12/2016   Procedure: APPLICATION OF A-CELL TO RIGHT CHEST WOUND;  Surgeon: Loel Lofty Dillingham, DO;  Location: [place];  Service: Clinical cytogeneticist;  Laterality: Right;   APPLICATION OF WOUND VAC N/A 05/09/2015   Procedure: APPLICATION OF WOUND VAC;  Surgeon: Rexene Alberts, MD;  Location: [place];  Service: Thoracic;  Laterality: N/A;   APPLICATION OF WOUND VAC N/A 11/13/2015   Procedure: APPLICATION OF WOUND VAC;  Surgeon: Rexene Alberts, MD;  Location: [place];  Service: Thoracic;  Laterality: N/A;   APPLICATION OF WOUND VAC N/A 11/21/2015   Procedure: APPLICATION OF WOUND VAC;  Surgeon: Loel Lofty Dillingham, DO;  Location: [place];  Service: Plastics;  Laterality: N/A;   APPLICATION OF WOUND VAC Right 12/21/2015   Procedure: APPLICATION OF WOUND VAC to right chest wall ;  Surgeon: Loel Lofty Dillingham, DO;  Location: [place];  Service: Plastics;  Laterality: Right;   APPLICATION OF WOUND VAC Right 01/11/2016   Procedure: APPLICATION OF WOUND VAC RIGHT CHEST ;  Surgeon: Loel Lofty Dillingham, DO;  Location: [place];  Service: Plastics;  Laterality: Right;   APPLICATION OF WOUND VAC Right 01/24/2016   Procedure:  APPLICATION OF WOUND VAC RIGHT CHEST WALL;  Surgeon: Loel Lofty Dillingham, DO;  Location: [place];  Service: Plastics;  Laterality: Right;   APPLICATION OF WOUND VAC Right 02/12/2016   Procedure: RE-APPLICATION OF WOUND VAC TO RIGHT CHEST WOUND;  Surgeon: Loel Lofty Dillingham, DO;  Location: [place];  Service: Plastics;  Laterality: Right;   BIV UPGRADE N/A 06/07/2019   Procedure: BIV UPGRADE;  Surgeon: Deboraha Sprang, MD;  Location: [place] [facility];  Service: Cardiovascular;  Laterality: N/A;   BREAST BIOPSY Left 11/25/06   neg   BREAST BIOPSY Left 01/20/12   /clip-neg   CARDIAC CATHETERIZATION  11/2013   [facility]   CARDIAC CATHETERIZATION  10/2014   [facility]   CARDIAC VALVE REPLACEMENT     CARDIOVERSION N/A 07/20/2018   Procedure: CARDIOVERSION;  Surgeon: [place] Hampshire, MD;  Location: [facility];  Service: Cardiovascular;  Laterality: N/A;   CARDIOVERSION N/A 09/18/2020   Procedure: CARDIOVERSION;  Surgeon: Minna Merritts, MD;  Location: [facility];  Service: Cardiovascular;  Laterality: N/A;   CATARACT EXTRACTION W/ INTRAOCULAR LENS  IMPLANT, BILATERAL Bilateral 2014   CLIPPING OF ATRIAL APPENDAGE N/A 04/25/2015   Procedure: CLIPPING OF ATRIAL APPENDAGE;  Surgeon: Rexene Alberts, MD;  Location: [place];  Service: Open Heart Surgery;  Laterality: N/A;   COCHLEAR IMPLANT Left 2005?   COLONOSCOPY WITH PROPOFOL N/A 01/20/2018   Procedure: COLONOSCOPY WITH PROPOFOL;  Surgeon: Toledo, Benay Pike, MD;  Location: [facility];  Service: Gastroenterology;  Laterality: N/A;   CORONARY ANGIOPLASTY     CORONARY ARTERY BYPASS GRAFT N/A 04/25/2015   Procedure: CORONARY ARTERY BYPASS GRAFTING (CABG) x ONE, using left internal mammary artery;  Surgeon: Rexene Alberts, MD;  Location: [place];  Service: Open Heart Surgery;  Laterality: N/A;   DILATION AND CURETTAGE OF UTERUS  "several before hysterectomy"   EP IMPLANTABLE DEVICE N/A 05/02/2015   Procedure: Pacemaker Implant;  Surgeon: [place] Grayer, MD;  Location: [place] [facility];  Service: Cardiovascular;  Laterality: N/A;   ESOPHAGOGASTRODUODENOSCOPY (EGD) WITH PROPOFOL N/A 01/20/2018   Procedure: ESOPHAGOGASTRODUODENOSCOPY (EGD) WITH PROPOFOL;  Surgeon: Toledo, Benay Pike, MD;  Location: [facility];  Service: Gastroenterology;  Laterality: N/A;   EYE SURGERY     I & D EXTREMITY Right 12/06/2015   Procedure: IRRIGATION AND DEBRIDEMENT RIGHT CHEST WALL WITH ACELL PLACEMENT AND VAC;  Surgeon: Loel Lofty Dillingham, DO;  Location: [place];  Service: Plastics;  Laterality: Right;   INCISION AND DRAINAGE OF WOUND N/A 11/21/2015   Procedure: IRRIGATION AND DEBRIDEMENT WOUND;  Surgeon: Loel Lofty Dillingham, DO;  Location: [place];  Service: Plastics;  Laterality: N/A;   INCISION AND DRAINAGE OF WOUND Right 12/21/2015   Procedure: IRRIGATION AND DEBRIDEMENT right chest wall WOUND;  Surgeon: Loel Lofty Dillingham, DO;  Location: [place];  Service: Plastics;  Laterality: Right;  right chest wall    INCISION AND DRAINAGE OF WOUND Right 01/24/2016   Procedure: IRRIGATION AND DEBRIDEMENT RIGHT CHEST WALL WOUND;  Surgeon: Loel Lofty Dillingham, DO;  Location: [place];  Service: Plastics;  Laterality: Right;   INCISION AND DRAINAGE OF WOUND Right 03/28/2016   Procedure: IRRIGATION AND DEBRIDEMENT RIGHT CHEST WALL WOUND WITH A Cell Placement;  Surgeon: Loel Lofty Dillingham, DO;  Location: [place];  Service: Plastics;  Laterality: Right;   INCISION AND DRAINAGE OF WOUND Right 05/17/2016   Procedure: IRRIGATION AND DEBRIDEMENT OF RIGHT CHEST WOUND WITH A CELL PLACEMENT;  Surgeon: Loel Lofty Dillingham, DO;  Location: [place];  Service: Plastics;  Laterality: Right;   INSERT / REPLACE / REMOVE PACEMAKER     IRRIGATION AND DEBRIDEMENT OF WOUND WITH SPLIT THICKNESS SKIN GRAFT Right 01/11/2016   Procedure: IRRIGATION AND DEBRIDEMENT OF RIGHT CHEST WOUND ;  Surgeon: Loel Lofty Dillingham, DO;  Location: [place];  Service: Plastics;  Laterality: Right;   MASTECTOMY, PARTIAL Right 2002   positive, partial    MAZE N/A 04/25/2015   Procedure: MAZE;  Surgeon: Rexene Alberts, MD;  Location: [place];  Service: Open Heart Surgery;  Laterality: N/A;   MITRAL VALVE REPAIR N/A 04/25/2015   Procedure: MITRAL VALVE  REPLACEMENT using a 27 mm Edwards Perimount Magna Mitral Ease Valve;  Surgeon: Rexene Alberts, MD;  Location: [place];  Service: Open Heart Surgery;  Laterality: N/A;   RIGHT HEART CATH N/A 02/08/2019   Procedure: RIGHT HEART CATH;  Surgeon: [place] Hampshire, MD;  Location: [place] [facility];  Service: Cardiovascular;  Laterality: N/A;   SKIN SPLIT GRAFT Right 02/12/2016   Procedure: IRRIGATION AND DEBRIDEMENT RIGHT CHEST WOUND;  Surgeon: Loel Lofty Dillingham, DO;  Location: [place];  Service: Plastics;  Laterality: Right;   STERNAL WIRES REMOVAL N/A 06/05/2015   Procedure: STERNAL WIRES REMOVAL;  Surgeon: Rexene Alberts, MD;  Location: [place];  Service: Thoracic;  Laterality: N/A;   STERNAL WOUND DEBRIDEMENT N/A 05/09/2015   Procedure: STERNAL WOUND DEBRIDEMENT;  Surgeon: Rexene Alberts, MD;  Location: [place];  Service: Thoracic;  Laterality: N/A;   STERNAL WOUND DEBRIDEMENT N/A 11/13/2015   Procedure: Excisional drainage of RIGHT Chest wall mass and breast mass ;  Surgeon: Rexene Alberts, MD;  Location: [place];  Service: Thoracic;  Laterality: N/A;   TEE WITHOUT CARDIOVERSION N/A 04/25/2015   Procedure: TRANSESOPHAGEAL ECHOCARDIOGRAM (TEE);  Surgeon: Rexene Alberts, MD;  Location: [place];  Service: Open Heart Surgery;  Laterality: N/A;   TONSILLECTOMY     TRAM Right 11/18/2015   Procedure: VRAM Vertical Rectus Abdominus Muscle Flap;  Surgeon: Loel Lofty Dillingham, DO;  Location: [place];  Service: Plastics;  Laterality: Right;  RIght Back   TRICUSPID VALVE REPLACEMENT N/A 04/25/2015   Procedure: TRICUSPID VALVE REPAIR;  Surgeon: Rexene Alberts, MD;  Location: [place];  Service: Open Heart Surgery;  Laterality: N/A;   VAGINAL HYSTERECTOMY     Family History  Problem Relation Age of Onset   Heart disease Father  Heart disease Brother    CVA Mother    Heart attack Paternal Uncle    Breast cancer Maternal Aunt    Breast cancer Paternal Aunt    Social History   Socioeconomic History   Marital status: Married    Spouse name: Not on file   Number of children: 0   Years of education: Not on file   Highest education level: Not on file  Occupational History   Not on file  Tobacco Use   Smoking status: Former    Packs/day: 1.50    Years: 40.00    Pack years: 60.00    Types: Cigarettes    Quit date: 04/20/1998    Years since quitting: 23.4   Smokeless tobacco: Never  Vaping Use   Vaping Use: Never used  Substance and Sexual Activity   Alcohol use: Yes    Comment: 11/10/2015 "glass of wine a few times/year, if that"   Drug use: No   Sexual activity: Yes  Other Topics Concern   Not on file  Social History Narrative   Not on file   Social Determinants of Health   Financial Resource Strain: Low Risk    Difficulty of Paying Living Expenses: Not hard at all  Food Insecurity: No Food Insecurity   Worried About Charity fundraiser in the Last Year: Never true   [place] in the Last Year: Never true  Transportation Needs: No Transportation Needs   Lack of Transportation (Medical): No   Lack of Transportation (Non-Medical): No  Physical Activity: Inactive   Days of Exercise per Week: 0 days   Minutes of Exercise per Session: 0 min  Stress: No Stress Concern Present   Feeling of Stress : Not at all  Social Connections: Moderately Isolated   Frequency of Communication with Friends and Family: More than three times a week   Frequency of Social Gatherings with Friends and Family: Three times a week   Attends Religious Services: Never   Active Member of Clubs or Organizations: No   Attends Archivist Meetings: Never   Marital Status: Married    Tobacco Counseling Counseling given: Not Answered   Clinical Intake:  Pre-visit preparation completed: Yes  Pain :  No/denies pain     Nutritional Risks: None Diabetes: Yes CBG done?: No Did pt. bring in CBG monitor from home?: No  How often do you need to have someone help you when you read instructions, pamphlets, or other written materials from your doctor or pharmacy?: 1 - Never    Interpreter Needed?: No  Information entered by :: Clemetine Marker LPN   Activities of Daily Living In your present state of health, do you have any difficulty performing the following activities: 10/01/2021 08/21/2021  Hearing? Tempie Donning  Vision? Y N  Difficulty concentrating or making decisions? N N  Walking or climbing stairs? Y Y  Dressing or bathing? Y Y  Doing errands, shopping? Tempie Donning  Preparing Food and eating ? N -  Using the Toilet? N -  In the past six months, have you accidently leaked urine? Y -  Do you have problems with loss of bowel control? N -  Managing your Medications? N -  Managing your Finances? N -  Housekeeping or managing your Housekeeping? N -  Some recent data might be hidden    Patient Care Team: Glean Hess, MD as PCP - General (Internal Medicine) [place] Hampshire, MD as PCP - Cardiology (Cardiology)  Deboraha Sprang, MD as PCP - Electrophysiology (Cardiology) Alisa Graff, FNP as Nurse Practitioner (Family Medicine) Flora Lipps, MD as Consulting Physician (Pulmonary Disease) Lonia Farber, MD as Consulting Physician (Internal Medicine)  Indicate any recent Medical Services you may have received from other than [facility] providers in the past year (date may be approximate).     Assessment:   This is a routine wellness examination for [name]  Hearing/Vision screen Hearing Screening - Comments:: Pt has cochlear implant Vision Screening - Comments:: Annual vision screenings done by [facility] Dr. Wallace Going  Dietary issues and exercise activities discussed: Current Exercise Habits: The patient does not participate in regular exercise at present, Exercise limited  by: orthopedic condition(s);respiratory conditions(s)   Goals Addressed             This Visit's Progress    Prevent falls       Recommend preventing falls at home        Depression Screen PHQ 2/9 Scores 10/01/2021 08/21/2021 05/22/2021 07/03/2015  PHQ - 2 Score 0 0 0 0  PHQ- 9 Score - 0 1 3    Fall Risk Fall Risk  10/01/2021 08/21/2021 05/22/2021 09/07/2018 07/27/2018  Falls in the past year? 1 0 0 0 0  Number falls in past yr: 1 0 0 0 0  Injury with Fall? 0 0 0 0 0  Risk for fall due to : Impaired balance/gait;History of fall(s);Impaired vision History of fall(s);Impaired balance/gait No Fall Risks - -  Follow up Falls prevention discussed Falls evaluation completed Falls evaluation completed - -    FALL RISK PREVENTION PERTAINING TO THE HOME:  Any stairs in or around the home? Yes  If so, are there any without handrails? No  Home free of loose throw rugs in walkways, pet beds, electrical cords, etc? Yes  Adequate lighting in your home to reduce risk of falls? Yes   ASSISTIVE DEVICES UTILIZED TO PREVENT FALLS:  Life alert? No  Use of a cane, walker or w/c? Yes  Grab bars in the bathroom? Yes  Shower chair or bench in shower? No  Elevated toilet seat or a handicapped toilet? Yes   TIMED UP AND GO:  Was the test performed? No . Telephonic visit.   Cognitive Function: Normal cognitive status assessed by direct observation by this Nurse Health Advisor. No abnormalities found.          Immunizations Immunization History  Administered Date(s) Administered   Fluad Quad(high Dose 65+) 05/22/2021   Influenza, High Dose Seasonal PF 06/27/2015, 06/12/2016, 06/10/2017, 04/30/2018, 06/23/2020   Influenza, Seasonal, Injecte, Preservative Fre 06/21/2015   Influenza-Unspecified 06/20/2012, 06/16/2013, 06/28/2014   PFIZER Comirnaty(Gray Top)Covid-19 Tri-Sucrose Vaccine 05/23/2020   PFIZER(Purple Top)SARS-COV-2 Vaccination 09/16/2019, 10/07/2019   Pfizer Covid-19 Vaccine  Bivalent Booster 47yrs & up 07/05/2021   Pneumococcal Conjugate-13 07/22/2016   Pneumococcal Polysaccharide-23 09/09/1992, 09/25/2005, 09/19/2014   Tdap 07/18/2017   Zoster Recombinat (Shingrix) 04/02/2018, 07/14/2018    TDAP status: Up to date  Flu Vaccine status: Up to date  Pneumococcal vaccine status: Up to date  Covid-19 vaccine status: Completed vaccines  Qualifies for Shingles Vaccine? Yes   Zostavax completed Yes   Shingrix Completed?: Yes  Screening Tests Health Maintenance  Topic Date Due   MAMMOGRAM  09/25/2022   TETANUS/TDAP  07/19/2027   Pneumonia Vaccine 26+ Years old  Completed   INFLUENZA VACCINE  Completed   DEXA SCAN  Completed   COVID-19 Vaccine  Completed   Zoster Vaccines- Shingrix  Completed   HPV VACCINES  Aged Out    Health Maintenance  There are no preventive care reminders to display for this patient.   Colorectal cancer screening: No longer required.   Mammogram status: Completed 09/25/21. Repeat every year  Bone Density status: Completed 10/17/20. Results reflect: Bone density results: OSTEOPOROSIS. Repeat every as directed years.  Lung Cancer Screening: (Low Dose CT Chest recommended if Age 10-80 years, 30 pack-year currently smoking OR have quit w/in 15years.) does not qualify.   Additional Screening:  Hepatitis C Screening: does not qualify  Vision Screening: Recommended annual ophthalmology exams for early detection of glaucoma and other disorders of the eye. Is the patient up to date with their annual eye exam?  Yes  Who is the provider or what is the name of the office in which the patient attends annual eye exams? [facility].   Dental Screening: Recommended annual dental exams for proper oral hygiene  Community Resource Referral / Chronic Care Management: CRR required this visit?  No   CCM required this visit?  No      Plan:     I have personally reviewed and noted the following in the patients chart:    Medical and social history Use of alcohol, tobacco or illicit drugs  Current medications and supplements including opioid prescriptions.  Functional ability and status Nutritional status Physical activity Advanced directives List of other physicians Hospitalizations, surgeries, and ER visits in previous 12 months Vitals Screenings to include cognitive, depression, and falls Referrals and appointments  In addition, I have reviewed and discussed with patient certain preventive protocols, quality metrics, and best practice recommendations. A written personalized care plan for preventive services as well as general preventive health recommendations were provided to patient.   Due to this being a telephonic visit, the after visit summary with patients personalized plan was offered to patient via my-chart.   Clemetine Marker, LPN   [id number]   Nurse Notes: none

## 2021-10-08 ENCOUNTER — Other Ambulatory Visit: Payer: Self-pay

## 2021-10-08 ENCOUNTER — Ambulatory Visit
Admission: RE | Admit: 2021-10-08 | Discharge: 2021-10-08 | Disposition: A | Discharge status: Home / Self Care | Payer: Medicare Other | Source: Ambulatory Visit | Attending: Internal Medicine | Admitting: Internal Medicine

## 2021-10-08 DIAGNOSIS — R928 Other abnormal and inconclusive findings on diagnostic imaging of breast: Secondary | ICD-10-CM

## 2021-10-08 DIAGNOSIS — N6320 Unspecified lump in the left breast, unspecified quadrant: Secondary | ICD-10-CM

## 2021-10-13 ENCOUNTER — Other Ambulatory Visit: Payer: Self-pay | Admitting: Cardiovascular Disease

## 2021-10-24 ENCOUNTER — Ambulatory Visit (INDEPENDENT_AMBULATORY_CARE_PROVIDER_SITE_OTHER): Payer: Medicare Other

## 2021-10-24 ENCOUNTER — Other Ambulatory Visit: Payer: Self-pay

## 2021-10-24 DIAGNOSIS — Z953 Presence of xenogenic heart valve: Secondary | ICD-10-CM | POA: Diagnosis not present

## 2021-10-24 DIAGNOSIS — Z5181 Encounter for therapeutic drug level monitoring: Secondary | ICD-10-CM

## 2021-10-24 DIAGNOSIS — I340 Nonrheumatic mitral (valve) insufficiency: Secondary | ICD-10-CM

## 2021-10-24 LAB — POCT INR: INR: 1.6 — AB (ref 2.0–3.0)

## 2021-10-24 NOTE — Patient Instructions (Signed)
-   take extra 1/2 tablet tonight, then - continue dosage of warfarin 1 tablet every day - Recheck INR in 4 weeks.

## 2021-11-06 ENCOUNTER — Telehealth: Payer: Self-pay | Admitting: Cardiovascular Disease

## 2021-11-06 MED ORDER — SPIRONOLACTONE 25 MG PO TABS: ORAL_TABLET | ORAL | 0 refills | Status: DC

## 2021-11-06 NOTE — Telephone Encounter (Signed)
Requested Prescriptions   Signed Prescriptions Disp Refills   spironolactone (ALDACTONE) 25 MG tablet 90 tablet 0    Sig: TAKE 1 TABLET BY MOUTH EVERY DAY    Authorizing Provider: Kathlyn [place] A    Ordering User: Raelene Bott, Mavis Gravelle L

## 2021-11-06 NOTE — Telephone Encounter (Signed)
°*  STAT* If patient is at the pharmacy, call can be transferred to refill team.   1. Which medications need to be refilled? (please list name of each medication and dose if known) spironolactone 25 MG 1 tablet daily   2. Which pharmacy/location (including street and city if local pharmacy) is medication to be sent to? [facility] in [place]   3. Do they need a 30 day or 90 day supply? 90 day

## 2021-11-13 ENCOUNTER — Other Ambulatory Visit: Payer: Self-pay | Admitting: Cardiovascular Disease

## 2021-11-21 ENCOUNTER — Ambulatory Visit (INDEPENDENT_AMBULATORY_CARE_PROVIDER_SITE_OTHER): Payer: Medicare Other

## 2021-11-21 ENCOUNTER — Other Ambulatory Visit: Payer: Self-pay

## 2021-11-21 DIAGNOSIS — I340 Nonrheumatic mitral (valve) insufficiency: Secondary | ICD-10-CM

## 2021-11-21 DIAGNOSIS — Z953 Presence of xenogenic heart valve: Secondary | ICD-10-CM | POA: Diagnosis not present

## 2021-11-21 DIAGNOSIS — Z5181 Encounter for therapeutic drug level monitoring: Secondary | ICD-10-CM | POA: Diagnosis not present

## 2021-11-21 LAB — POCT INR: INR: 2.6 (ref 2.0–3.0)

## 2021-11-21 NOTE — Patient Instructions (Signed)
-   continue dosage of warfarin 1 tablet every day ?- Recheck INR in 6 weeks. ?

## 2021-12-03 LAB — BASIC METABOLIC PANEL
BUN: 20 (ref 4–21)
CO2: 38 — AB (ref 13–22)
Chloride: 100 (ref 99–108)
Creatinine: 0.9 (ref 0.5–1.1)
Glucose: 104
Potassium: 3.8 mEq/L (ref 3.5–5.1)
Sodium: 145 (ref 137–147)

## 2021-12-03 LAB — COMPREHENSIVE METABOLIC PANEL: eGFR: 60

## 2021-12-06 ENCOUNTER — Ambulatory Visit (INDEPENDENT_AMBULATORY_CARE_PROVIDER_SITE_OTHER): Payer: Medicare Other

## 2021-12-06 DIAGNOSIS — I428 Other cardiomyopathies: Secondary | ICD-10-CM

## 2021-12-06 LAB — CUP PACEART REMOTE DEVICE CHECK
Battery Remaining Longevity: 32 mo
Battery Voltage: 2.95 V
Brady Statistic AP VP Percent: 98.25 %
Brady Statistic AP VS Percent: 0.03 %
Brady Statistic AS VP Percent: 1.69 %
Brady Statistic AS VS Percent: 0.03 %
Brady Statistic RA Percent Paced: 97.98 %
Brady Statistic RV Percent Paced: 99.91 %
Date Time Interrogation Session: 20230330065415
Implantable Lead Implant Date: 20160823
Implantable Lead Implant Date: 20160823
Implantable Lead Location: 753859
Implantable Lead Location: 753860
Implantable Lead Model: 5076
Implantable Lead Model: 5076
Implantable Pulse Generator Implant Date: 20160823
Lead Channel Impedance Value: 342 Ohm
Lead Channel Impedance Value: 361 Ohm
Lead Channel Impedance Value: 399 Ohm
Lead Channel Impedance Value: 399 Ohm
Lead Channel Pacing Threshold Amplitude: 0.5 V
Lead Channel Pacing Threshold Amplitude: 0.5 V
Lead Channel Pacing Threshold Pulse Width: 0.4 ms
Lead Channel Pacing Threshold Pulse Width: 0.4 ms
Lead Channel Sensing Intrinsic Amplitude: 1.375 mV
Lead Channel Sensing Intrinsic Amplitude: 1.375 mV
Lead Channel Sensing Intrinsic Amplitude: 13.25 mV
Lead Channel Sensing Intrinsic Amplitude: 13.25 mV
Lead Channel Setting Pacing Amplitude: 1.5 V
Lead Channel Setting Pacing Amplitude: 2 V
Lead Channel Setting Pacing Pulse Width: 0.4 ms
Lead Channel Setting Sensing Sensitivity: 2.8 mV

## 2021-12-18 NOTE — Progress Notes (Signed)
Remote pacemaker transmission.   

## 2021-12-23 ENCOUNTER — Other Ambulatory Visit: Payer: Self-pay | Admitting: Internal Medicine

## 2021-12-23 ENCOUNTER — Other Ambulatory Visit: Payer: Self-pay | Admitting: Cardiovascular Disease

## 2021-12-23 DIAGNOSIS — E039 Hypothyroidism, unspecified: Secondary | ICD-10-CM

## 2021-12-23 DIAGNOSIS — M5432 Sciatica, left side: Secondary | ICD-10-CM

## 2021-12-23 DIAGNOSIS — K219 Gastro-esophageal reflux disease without esophagitis: Secondary | ICD-10-CM

## 2021-12-24 NOTE — Telephone Encounter (Signed)
Please schedule 6 month F/U appt for 90 day refills. Thank you! 

## 2021-12-24 NOTE — Telephone Encounter (Signed)
Requested Prescriptions  ?Pending Prescriptions Disp Refills  ?? pantoprazole (PROTONIX) 40 MG tablet [Pharmacy Med Name: PANTOPRAZOLE SOD DR 40 MG TAB] 180 tablet 1  ?  Sig: TAKE 1 TABLET BY MOUTH TWICE A DAY  ?  ? Gastroenterology: Proton Pump Inhibitors Passed - 12/23/2021 12:33 PM  ?  ?  Passed - Valid encounter within last 12 months  ?  Recent Outpatient Visits   ?      ? 4 months ago Essential hypertension  ? [facility] Glean Hess, MD  ? 7 months ago Adult hypothyroidism  ? [facility] Glean Hess, MD  ?  ?  ?Future Appointments   ?        ? In 1 month Army Melia Jesse Sans, MD [facility]  ?  ? ?  ?  ?  ?? gabapentin (NEURONTIN) 100 MG capsule [Pharmacy Med Name: GABAPENTIN 100 MG CAPSULE] 180 capsule 1  ?  Sig: TAKE 2 CAPSULES BY MOUTH AT BEDTIME.  ?  ? Neurology: Anticonvulsants - gabapentin Failed - 12/23/2021 12:33 PM  ?  ?  Failed - Cr in normal range and within 360 days  ?  Creatinine, Ser  ?Date Value Ref Range Status  ?08/21/2021 1.01 (H) 0.57 - 1.00 mg/dL Final  ?   ?  ?  Passed - Completed PHQ-2 or PHQ-9 in the last 360 days  ?  ?  Passed - Valid encounter within last 12 months  ?  Recent Outpatient Visits   ?      ? 4 months ago Essential hypertension  ? [facility] Glean Hess, MD  ? 7 months ago Adult hypothyroidism  ? [facility] Glean Hess, MD  ?  ?  ?Future Appointments   ?        ? In 1 month Army Melia Jesse Sans, MD [facility]  ?  ? ?  ?  ?  ?? levothyroxine (SYNTHROID) 88 MCG tablet [Pharmacy Med Name: LEVOTHYROXINE 88 MCG TABLET] 90 tablet 1  ?  Sig: TAKE 1 TABLET BY MOUTH DAILY BEFORE BREAKFAST.  ?  ? Endocrinology:  Hypothyroid Agents Passed - 12/23/2021 12:33 PM  ?  ?  Passed - TSH in normal range and within 360 days  ?  TSH  ?Date Value Ref Range Status  ?08/21/2021 1.390 0.450 - 4.500 uIU/mL Final  ?   ?  ?  Passed - Valid encounter within last 12 months  ?  Recent Outpatient Visits   ?      ? 4  months ago Essential hypertension  ? [facility] Glean Hess, MD  ? 7 months ago Adult hypothyroidism  ? [facility] Glean Hess, MD  ?  ?  ?Future Appointments   ?        ? In 1 month Army Melia Jesse Sans, MD [facility] [place]  ?  ? ?  ?  ?  ? ? ?

## 2022-01-02 ENCOUNTER — Ambulatory Visit (INDEPENDENT_AMBULATORY_CARE_PROVIDER_SITE_OTHER): Payer: Medicare Other

## 2022-01-02 DIAGNOSIS — Z5181 Encounter for therapeutic drug level monitoring: Secondary | ICD-10-CM | POA: Diagnosis not present

## 2022-01-02 DIAGNOSIS — Z953 Presence of xenogenic heart valve: Secondary | ICD-10-CM

## 2022-01-02 DIAGNOSIS — I340 Nonrheumatic mitral (valve) insufficiency: Secondary | ICD-10-CM | POA: Diagnosis not present

## 2022-01-02 LAB — POCT INR: INR: 3.3 — AB (ref 2.0–3.0)

## 2022-01-02 NOTE — Patient Instructions (Signed)
-   continue dosage of warfarin 1 tablet every day; Eat greens tomorrow ?- Recheck INR in 6 weeks. ?

## 2022-01-16 ENCOUNTER — Other Ambulatory Visit: Payer: Self-pay | Admitting: Cardiovascular Disease

## 2022-01-16 MED ORDER — AMIODARONE HCL 200 MG PO TABS: 200.0000 mg | ORAL_TABLET | Freq: Every day | ORAL | 0 refills | Status: DC

## 2022-01-16 MED ORDER — FUROSEMIDE 20 MG PO TABS: ORAL_TABLET | ORAL | 0 refills | Status: DC

## 2022-01-16 MED ORDER — SIMVASTATIN 10 MG PO TABS: ORAL_TABLET | ORAL | 0 refills | Status: DC

## 2022-01-16 NOTE — Addendum Note (Signed)
Addended by: Britt Bottom on: 01/16/2022 10:15 AM ? ? Modules accepted: Orders ? ?

## 2022-01-20 ENCOUNTER — Other Ambulatory Visit: Payer: Self-pay | Admitting: Cardiovascular Disease

## 2022-01-27 ENCOUNTER — Other Ambulatory Visit: Payer: Self-pay | Admitting: Cardiovascular Disease

## 2022-01-28 NOTE — Telephone Encounter (Signed)
Refill Request.  

## 2022-01-28 NOTE — Telephone Encounter (Signed)
Request for warfarin refill:  Last INR was 3.3 on 01/02/22 Next INR due on 02/13/22 LOV was 07/20/21  Refill approved.

## 2022-02-05 ENCOUNTER — Encounter: Payer: Self-pay | Admitting: Internal Medicine

## 2022-02-05 ENCOUNTER — Ambulatory Visit (INDEPENDENT_AMBULATORY_CARE_PROVIDER_SITE_OTHER): Payer: Medicare Other | Admitting: Internal Medicine

## 2022-02-05 VITALS — BP 114/52 | HR 75 | Ht 61.0 in | Wt 121.0 lb

## 2022-02-05 DIAGNOSIS — I442 Atrioventricular block, complete: Secondary | ICD-10-CM

## 2022-02-05 DIAGNOSIS — I4819 Other persistent atrial fibrillation: Secondary | ICD-10-CM

## 2022-02-05 DIAGNOSIS — Z950 Presence of cardiac pacemaker: Secondary | ICD-10-CM

## 2022-02-05 DIAGNOSIS — I428 Other cardiomyopathies: Secondary | ICD-10-CM

## 2022-02-05 DIAGNOSIS — I5022 Chronic systolic (congestive) heart failure: Secondary | ICD-10-CM | POA: Diagnosis not present

## 2022-02-05 DIAGNOSIS — Z79899 Other long term (current) drug therapy: Secondary | ICD-10-CM

## 2022-02-05 LAB — PACEMAKER DEVICE OBSERVATION

## 2022-02-05 MED ORDER — ENTRESTO 49-51 MG PO TABS: ORAL_TABLET | ORAL | 0 refills | Status: DC

## 2022-02-05 NOTE — Patient Instructions (Addendum)
Medication Instructions:  - Your physician has recommended you make the following change in your medication:   1) DECREASE Entresto 49/51 mg: - take 0.5 tablet by mouth twice daily   Please call the office/ send a MyChart message in a couple of weeks and let us know if your dizziness is better If you feel better on the lower dose Entresto, we will send in a prescription for the lower dose tablet for you  *If you need a refill on your cardiac medications before your next appointment, please call your pharmacy*   Lab Work: - Your physician recommends that you return for lab work at your convenience :  CMET/ TSH/ [place] Entrance at [facility] 1st desk on the right to check in (REGISTRATION)  Lab hours: Monday- Friday (7:30 am- 5:30 pm)   If you have labs (blood work) drawn today and your tests are completely normal, you will receive your results only by: MyChart Message (if you have MyChart) OR A paper copy in the mail If you have any lab test that is abnormal or we need to change your treatment, we will call you to review the results.   Testing/Procedures: - none ordered   Follow-Up: At [facility], you and your health needs are our priority.  As part of our continuing mission to provide you with exceptional heart care, we have created designated Provider Care Teams.  These Care Teams include your primary Cardiologist (physician) and Advanced Practice Providers (APPs -  Physician Assistants and Nurse Practitioners) who all work together to provide you with the care you need, when you need it.  We recommend signing up for the patient portal called "MyChart".  Sign up information is provided on this After Visit Summary.  MyChart is used to connect with patients for Virtual Visits (Telemedicine).  Patients are able to view lab/test results, encounter notes, upcoming appointments, etc.  Non-urgent messages can be sent to your provider as well.   To learn more about what you can  do with MyChart, go to [email or web address].    Your next appointment:   6 month(s)  The format for your next appointment:   In Person  Provider:   Virl Axe, MD    Other Instructions N/a  Important Information About Sugar

## 2022-02-05 NOTE — Progress Notes (Signed)
See reply Patient Care Team: Glean Hess, MD as PCP - General (Internal Medicine) Wellington Hampshire, MD as PCP - Cardiology (Cardiology) Deboraha Sprang, MD as PCP - Electrophysiology (Cardiology) Alisa Graff, FNP as Nurse Practitioner (Family Medicine) Flora Lipps, MD as Consulting Physician (Pulmonary Disease) Lonia Farber, MD as Consulting Physician (Internal Medicine)   HPI  [name] is a 87 y.o. 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  Complains of dyspnea but this is much better on the oxygen; occasional chest pressure with exertion which resolves with rest.  Mild peripheral edema she is wearing oxygen 24/7 and sleeps relatively well. Some lightheadedness with standing and issues of balance which she ascribes to her Mnire's disease.  Patient denies symptoms of respiratory, GI intolerance, sun sensitivity, neurological symptoms attributable to amiodarone.         DATE TEST EF   5/16 Echo   65 %   12/16 Echo   30-35 %   1/18 Echo  55%   11/19 Echo  20-25%   12/19 Echo  35%   6/20 RHC  RVsys 70 PCWP 23 RA  7/20 Echo  35% RV dysfunction mod  6/22 Echo   35-40% RV dysfunction severe PA press >58           Date Cr K Hgb TSH LFTs  8/17     9.2    5/18 0.8  11.5    5/19 0.8 3.9  13.5 (MCV 103<<89)    11/19 0.8 3.9 9.9 2.168 43-  6/20 0.84 4.9 8.0 2.860 (2/20) 51  11/20 0.74 4.2 11.8 2.119   6/21 0.8 3.8 13.1 1.039   11/21 0.82 4.1 12.2 (1/22) 0.824 14  12/22 1.01 3.2  1.39 15     Hx of recurrent anemia and has had Fe replacement, but not sure if there is a specific diagnosis    Past Medical History:  Diagnosis Date   Acute on chronic respiratory failure with hypoxia ([place]) 02/04/2019   Added automatically from request for surgery [id number]   Acute on chronic systolic heart failure ([place])    Anxiety    Arthritis    "hands" (11/10/2015)   Breast cancer ([place]) 2002   right   CAD s/p CABG x 1    a. 04/2015 LIMA to LAD   Chronic combined systolic (congestive) and diastolic (congestive) heart failure ([place])    a. 01/2015 Echo: EF 50-55%, Gr2 DD (preop valve surgery); b. 08/2015 Echo: EF 30-35%, diff HK. Gr2 DD; c. 09/2016 Echo: EF 50-55%; d. 07/2018 Echo: EF 20-25%; e. 03/2019 Echo: EF 35-40%, diff HK. Mod red RV fxn, RVSP 34.80mHg. Mildly dil LA. Nl MV prosthesis. Triv AI.   COPD (chronic obstructive pulmonary disease) (HCC)    Coronary artery disease    GERD (gastroesophageal reflux disease)  History of colon polyps    History of mitral valve replacement    a. 04/2015 s/p 27 mm [facility] Mitral bovine bioprosthetic tissue valve   Hyperlipidemia    Hypertension    Hypothyroidism    Maze operation for AF w/ LAA clipping    a. 04/2015 Complete bilateral atrial lesion set using cryothermy and bipolar radiofrequency ablation with clipping of LA appendage   Meniere disease    Meniere's disease    On home oxygen therapy    "2L at night" (11/10/2015)   PAF (paroxysmal atrial fibrillation) ([place])    Personal history of radiation therapy    Pneumonia ~ 2010   PONV (postoperative nausea and vomiting)    Pt felt like eardrums were gonna pop   Post-surgical complete heart block, symptomatic    a. 04/2015 s/p MDT [id number] Advisa DR MRI DC PPM.   Presence of permanent cardiac  pacemaker    Pulmonary hypertension ([place])    Respiratory failure ([place]) 02/22/2019   Surgical wound, non healing - chest wall 11/10/2015   Tricuspid Regurgitation s/p Repair    a. 04/2015 s/p 26 mm Edwards mc3 ring annuloplasty   Wound infection 10/13/2015   Superficial sternal wound infection    Past Surgical History:  Procedure Laterality Date   APPENDECTOMY     APPLICATION OF A-CELL OF CHEST/ABDOMEN N/A 11/21/2015   Procedure: APPLICATION OF A-CELL OF CHEST/ABDOMEN;  Surgeon: Loel Lofty Dillingham, DO;  Location: [place];  Service: Plastics;  Laterality: N/A;   APPLICATION OF A-CELL OF CHEST/ABDOMEN Right 12/21/2015   Procedure: APPLICATION OF A-CELL OF RIGHT CHEST;  Surgeon: Loel Lofty Dillingham, DO;  Location: [place];  Service: Plastics;  Laterality: Right;   APPLICATION OF A-CELL OF CHEST/ABDOMEN Right 01/11/2016   Procedure: APPLICATION OF A-CELL OF RIGHT CHEST;  Surgeon: Loel Lofty Dillingham, DO;  Location: [place];  Service: Plastics;  Laterality: Right;   APPLICATION OF A-CELL OF CHEST/ABDOMEN Right 02/12/2016   Procedure: APPLICATION OF A-CELL TO RIGHT CHEST WOUND;  Surgeon: Loel Lofty Dillingham, DO;  Location: [place];  Service: Plastics;  Laterality: Right;   APPLICATION OF WOUND VAC N/A 05/09/2015   Procedure: APPLICATION OF WOUND VAC;  Surgeon: Rexene Alberts, MD;  Location: [place];  Service: Thoracic;  Laterality: N/A;   APPLICATION OF WOUND VAC N/A 11/13/2015   Procedure: APPLICATION OF WOUND VAC;  Surgeon: Rexene Alberts, MD;  Location: [place];  Service: Thoracic;  Laterality: N/A;   APPLICATION OF WOUND VAC N/A 11/21/2015   Procedure: APPLICATION OF WOUND VAC;  Surgeon: Loel Lofty Dillingham, DO;  Location: [place];  Service: Plastics;  Laterality: N/A;   APPLICATION OF WOUND VAC Right 12/21/2015   Procedure: APPLICATION OF WOUND VAC to right chest wall ;  Surgeon: Loel Lofty Dillingham, DO;  Location: [place];  Service: Plastics;  Laterality: Right;   APPLICATION OF WOUND VAC Right 01/11/2016   Procedure:  APPLICATION OF WOUND VAC RIGHT CHEST ;  Surgeon: Loel Lofty Dillingham, DO;  Location: [place];  Service: Plastics;  Laterality: Right;   APPLICATION OF WOUND VAC Right 01/24/2016   Procedure: APPLICATION OF WOUND VAC RIGHT CHEST WALL;  Surgeon: Loel Lofty Dillingham, DO;  Location: [place];  Service: Plastics;  Laterality: Right;   APPLICATION OF WOUND VAC Right 02/12/2016   Procedure: RE-APPLICATION OF WOUND VAC TO RIGHT CHEST WOUND;  Surgeon: Loel Lofty Dillingham, DO;  Location: [place];  Service: Plastics;  Laterality: Right;   BIV UPGRADE N/A 06/07/2019  Procedure: BIV UPGRADE;  Surgeon: Deboraha Sprang, MD;  Location: [place] [facility];  Service: Cardiovascular;  Laterality: N/A;   BREAST BIOPSY Left 11/25/06   neg   BREAST BIOPSY Left 01/20/12   /clip-neg   CARDIAC CATHETERIZATION  11/2013   [facility]   CARDIAC CATHETERIZATION  10/2014   [facility]   CARDIAC VALVE REPLACEMENT     CARDIOVERSION N/A 07/20/2018   Procedure: CARDIOVERSION;  Surgeon: Wellington Hampshire, MD;  Location: [facility];  Service: Cardiovascular;  Laterality: N/A;   CARDIOVERSION N/A 09/18/2020   Procedure: CARDIOVERSION;  Surgeon: Minna Merritts, MD;  Location: [facility];  Service: Cardiovascular;  Laterality: N/A;   CATARACT EXTRACTION W/ INTRAOCULAR LENS  IMPLANT, BILATERAL Bilateral 2014   CLIPPING OF ATRIAL APPENDAGE N/A 04/25/2015   Procedure: CLIPPING OF ATRIAL APPENDAGE;  Surgeon: Rexene Alberts, MD;  Location: [place];  Service: Open Heart Surgery;  Laterality: N/A;   COCHLEAR IMPLANT Left 2005?   COLONOSCOPY WITH PROPOFOL N/A 01/20/2018   Procedure: COLONOSCOPY WITH PROPOFOL;  Surgeon: Toledo, Benay Pike, MD;  Location: [facility];  Service: Gastroenterology;  Laterality: N/A;   CORONARY ANGIOPLASTY     CORONARY ARTERY BYPASS GRAFT N/A 04/25/2015   Procedure: CORONARY ARTERY BYPASS GRAFTING (CABG) x ONE, using left internal mammary artery;  Surgeon: Rexene Alberts, MD;  Location: [place];  Service: Open Heart Surgery;  Laterality:  N/A;   DILATION AND CURETTAGE OF UTERUS  "several before hysterectomy"   EP IMPLANTABLE DEVICE N/A 05/02/2015   Procedure: Pacemaker Implant;  Surgeon: Thompson Grayer, MD;  Location: [place] [facility];  Service: Cardiovascular;  Laterality: N/A;   ESOPHAGOGASTRODUODENOSCOPY (EGD) WITH PROPOFOL N/A 01/20/2018   Procedure: ESOPHAGOGASTRODUODENOSCOPY (EGD) WITH PROPOFOL;  Surgeon: Toledo, Benay Pike, MD;  Location: [facility];  Service: Gastroenterology;  Laterality: N/A;   EYE SURGERY     I & D EXTREMITY Right 12/06/2015   Procedure: IRRIGATION AND DEBRIDEMENT RIGHT CHEST WALL WITH ACELL PLACEMENT AND VAC;  Surgeon: Loel Lofty Dillingham, DO;  Location: [place];  Service: Plastics;  Laterality: Right;   INCISION AND DRAINAGE OF WOUND N/A 11/21/2015   Procedure: IRRIGATION AND DEBRIDEMENT WOUND;  Surgeon: Loel Lofty Dillingham, DO;  Location: [place];  Service: Plastics;  Laterality: N/A;   INCISION AND DRAINAGE OF WOUND Right 12/21/2015   Procedure: IRRIGATION AND DEBRIDEMENT right chest wall WOUND;  Surgeon: Loel Lofty Dillingham, DO;  Location: [place];  Service: Plastics;  Laterality: Right;  right chest wall    INCISION AND DRAINAGE OF WOUND Right 01/24/2016   Procedure: IRRIGATION AND DEBRIDEMENT RIGHT CHEST WALL WOUND;  Surgeon: Loel Lofty Dillingham, DO;  Location: [place];  Service: Plastics;  Laterality: Right;   INCISION AND DRAINAGE OF WOUND Right 03/28/2016   Procedure: IRRIGATION AND DEBRIDEMENT RIGHT CHEST WALL WOUND WITH A Cell Placement;  Surgeon: Loel Lofty Dillingham, DO;  Location: [place];  Service: Plastics;  Laterality: Right;   INCISION AND DRAINAGE OF WOUND Right 05/17/2016   Procedure: IRRIGATION AND DEBRIDEMENT OF RIGHT CHEST WOUND WITH A CELL PLACEMENT;  Surgeon: Loel Lofty Dillingham, DO;  Location: [place];  Service: Plastics;  Laterality: Right;   INSERT / REPLACE / REMOVE PACEMAKER     IRRIGATION AND DEBRIDEMENT OF WOUND WITH SPLIT THICKNESS SKIN GRAFT Right 01/11/2016   Procedure: IRRIGATION AND  DEBRIDEMENT OF RIGHT CHEST WOUND ;  Surgeon: Loel Lofty Dillingham, DO;  Location: [place];  Service: Plastics;  Laterality: Right;   MASTECTOMY, PARTIAL Right 2002  positive, partial   MAZE N/A 04/25/2015   Procedure: MAZE;  Surgeon: Rexene Alberts, MD;  Location: [place];  Service: Open Heart Surgery;  Laterality: N/A;   MITRAL VALVE REPAIR N/A 04/25/2015   Procedure: MITRAL VALVE  REPLACEMENT using a 27 mm Edwards Perimount Magna Mitral Ease Valve;  Surgeon: Rexene Alberts, MD;  Location: [place];  Service: Open Heart Surgery;  Laterality: N/A;   RIGHT HEART CATH N/A 02/08/2019   Procedure: RIGHT HEART CATH;  Surgeon: Wellington Hampshire, MD;  Location: [place] [facility];  Service: Cardiovascular;  Laterality: N/A;   SKIN SPLIT GRAFT Right 02/12/2016   Procedure: IRRIGATION AND DEBRIDEMENT RIGHT CHEST WOUND;  Surgeon: Loel Lofty Dillingham, DO;  Location: [place];  Service: Plastics;  Laterality: Right;   STERNAL WIRES REMOVAL N/A 06/05/2015   Procedure: STERNAL WIRES REMOVAL;  Surgeon: Rexene Alberts, MD;  Location: [place];  Service: Thoracic;  Laterality: N/A;   STERNAL WOUND DEBRIDEMENT N/A 05/09/2015   Procedure: STERNAL WOUND DEBRIDEMENT;  Surgeon: Rexene Alberts, MD;  Location: [place];  Service: Thoracic;  Laterality: N/A;   STERNAL WOUND DEBRIDEMENT N/A 11/13/2015   Procedure: Excisional drainage of RIGHT Chest wall mass and breast mass ;  Surgeon: Rexene Alberts, MD;  Location: [place];  Service: Thoracic;  Laterality: N/A;   TEE WITHOUT CARDIOVERSION N/A 04/25/2015   Procedure: TRANSESOPHAGEAL ECHOCARDIOGRAM (TEE);  Surgeon: Rexene Alberts, MD;  Location: [place];  Service: Open Heart Surgery;  Laterality: N/A;   TONSILLECTOMY     TRAM Right 11/18/2015   Procedure: VRAM Vertical Rectus Abdominus Muscle Flap;  Surgeon: Loel Lofty Dillingham, DO;  Location: [place];  Service: Plastics;  Laterality: Right;  RIght Back   TRICUSPID VALVE REPLACEMENT N/A 04/25/2015   Procedure: TRICUSPID VALVE REPAIR;  Surgeon:  Rexene Alberts, MD;  Location: [place];  Service: Open Heart Surgery;  Laterality: N/A;   VAGINAL HYSTERECTOMY      Current Outpatient Medications  Medication Sig Dispense Refill   acetaminophen (TYLENOL) 500 MG tablet Take 1,000 mg by mouth every 6 (six) hours as needed (for pain.).     AMBULATORY NON FORMULARY MEDICATION Inogen at Home Oxygen machine with tubing. DX: COPD DX Code: J44.9 1 each 0   amiodarone (PACERONE) 200 MG tablet Take 1 tablet (200 mg total) by mouth daily. 30 tablet 0   bisoprolol (ZEBETA) 5 MG tablet Take 0.5 tablets (2.5 mg total) by mouth daily. 45 tablet 1   diphenhydrAMINE (BENADRYL) 25 mg capsule Take 25 mg by mouth every 6 (six) hours as needed for allergies.     docusate sodium (COLACE) 100 MG capsule Take 100 mg by mouth 2 (two) times daily as needed (constipation.).     ENTRESTO 49-51 MG TAKE 1 TABLET (49/51 MG) BY MOUTH TWICE DAILY *DOSE INCREASE* 60 tablet 0   furosemide (LASIX) 20 MG tablet TAKE 2 TABLETS (40 MG) IN THE MORNING AND 1 TABLET (20) MG IN THE EVENING. 90 tablet 0   gabapentin (NEURONTIN) 100 MG capsule TAKE 2 CAPSULES BY MOUTH AT BEDTIME. 180 capsule 0   ipratropium-albuterol (DUONEB) 0.5-2.5 (3) MG/3ML SOLN Inhale 3 mLs into the lungs every 6 (six) hours as needed. 120 mL 11   levothyroxine (SYNTHROID) 88 MCG tablet TAKE 1 TABLET BY MOUTH DAILY BEFORE BREAKFAST. 90 tablet 0   Multiple Vitamins-Minerals (PRESERVISION AREDS 2 PO) Take 1 tablet by mouth 2 (two) times daily.      OXYGEN Inhale 2  L into the lungs continuous.     pantoprazole (PROTONIX) 40 MG tablet TAKE 1 TABLET BY MOUTH TWICE A DAY 180 tablet 0   simvastatin (ZOCOR) 10 MG tablet TAKE 1 TABLET BY MOUTH EVERYDAY AT BEDTIME 30 tablet 0   spironolactone (ALDACTONE) 25 MG tablet TAKE 1 TABLET BY MOUTH EVERY DAY 90 tablet 0   warfarin (COUMADIN) 2.5 MG tablet TAKE AS DIRECTED PER COUMADIN CLINIC 100 tablet 1   No current facility-administered medications for this visit.    Allergies   Allergen Reactions   Ace Inhibitors Cough and Other (See Comments)   Amoxicillin-Pot Clavulanate Swelling and Other (See Comments)    SWOLLEN JOINTS  Has patient had a PCN reaction causing immediate rash, facial/tongue/throat swelling, SOB or lightheadedness with hypotension: Yes Has patient had a PCN reaction causing severe rash involving mucus membranes or skin necrosis: No Has patient had a PCN reaction that required hospitalization No Has patient had a PCN reaction occurring within the last 10 years: No If all of the above answers are "NO", then may proceed with Cephalosporin   Penicillins Swelling and Other (See Comments)    SWOLLEN JOINTS  Has patient had a PCN reaction causing immediate rash, facial/tongue/throat swelling, SOB or lightheadedness with hypotension: Yes Has patient had a PCN reaction causing severe rash involving mucus membranes or skin necrosis: No Has patient had a PCN reaction that required hospitalization No Has patient had a PCN reaction occurring within the last 10 years: No If all of the above answers are "NO", then may proceed with Cephalosporin use.  Swollen joints   Rosuvastatin Other (See Comments)   Nifedipine Other (See Comments)    UNSPECIFIED UNSPECIFIED    Propranolol Other (See Comments)    UNSPECIFIED UNSPECIFIED    Diazepam Other (See Comments)    Made her "hyper"    Meperidine Other (See Comments)    Made her "hyper"     Propoxyphene Other (See Comments)    Made her "hyper"        Review of Systems negative except from HPI and PMH  Physical Exam BP (!) 114/52 (BP Location: Left Arm, Patient Position: Sitting, Cuff Size: Normal)   Pulse 75   Ht '5\' 1"'$  (1.549 m)   Wt 121 lb (54.9 kg)   SpO2 94% Comment: on 2L O2  BMI 22.86 kg/m   Well developed and well nourished in no acute distress HENT normal Neck supple   Clear Device pocket well healed; without hematoma or erythema.  There is no tethering  Regular rate and rhythm,  no  gallop No  murmur Abd-soft with active BS No Clubbing cyanosis 1+ edema Skin-warm and dry A & Oriented  Grossly normal sensory and motor function  ECG AV pacing at 75  Assessment and  Plan  Atrial fibrillation-persistent status post maze operation  Recurrent  High Risk Medication Surveillance Amiodarone   Complete heart block -Intermittent  Sinus node dysfunction   First-degree AV block profound--- right bundle branch block  Status post mitral valve replacement-bioprosthetic with tricuspid valve repair  Pacemaker-Medtronic     Oxygen dependent lung disease    Hypokalemia  Cardiomyopathy   Anemia-resolved  Tremor and gait instability  Holding sinus rhythm.  We will continue the amiodarone at 200 mg a day.  Need surveillance laboratories.  Last potassium level was low.  We will recheck it today.  The patient's device was interrogated.  The information was reviewed. No changes were made in the programming.  Conduction currently is 1: 1 with profound first-degree AV block at greater than 400 ms; forced AV pacing.  Some of her gait instability and the situation of her lightheadedness may be hypotension, her blood pressure is low today.  We will decrease her Entresto from 49/51--24/26 for couple of weeks as an experiment to see if the symptoms attenuate.  History of anemia.  We will check her CBC

## 2022-02-13 ENCOUNTER — Other Ambulatory Visit: Payer: Self-pay | Admitting: Cardiovascular Disease

## 2022-02-13 ENCOUNTER — Ambulatory Visit (INDEPENDENT_AMBULATORY_CARE_PROVIDER_SITE_OTHER): Payer: Medicare Other

## 2022-02-13 DIAGNOSIS — Z953 Presence of xenogenic heart valve: Secondary | ICD-10-CM

## 2022-02-13 DIAGNOSIS — Z5181 Encounter for therapeutic drug level monitoring: Secondary | ICD-10-CM

## 2022-02-13 DIAGNOSIS — I340 Nonrheumatic mitral (valve) insufficiency: Secondary | ICD-10-CM

## 2022-02-13 LAB — POCT INR: INR: 3.4 — AB (ref 2.0–3.0)

## 2022-02-13 NOTE — Telephone Encounter (Signed)
Patient is scheduled 6/20

## 2022-02-13 NOTE — Telephone Encounter (Signed)
Please schedule 6 month F/U appt with Dr. Fletcher Anon. Thank you!

## 2022-02-13 NOTE — Patient Instructions (Signed)
-   DECREASE TO 1 tablet every day, EXCEPT 0.5 TABLET ON WEDNESDAYS.  - Recheck INR in 6 weeks.

## 2022-02-18 ENCOUNTER — Other Ambulatory Visit: Payer: Self-pay | Admitting: Cardiovascular Disease

## 2022-02-19 ENCOUNTER — Ambulatory Visit (INDEPENDENT_AMBULATORY_CARE_PROVIDER_SITE_OTHER): Payer: Medicare Other | Admitting: Internal Medicine

## 2022-02-19 ENCOUNTER — Telehealth: Payer: Self-pay | Admitting: Internal Medicine

## 2022-02-19 ENCOUNTER — Encounter: Payer: Self-pay | Admitting: Internal Medicine

## 2022-02-19 VITALS — BP 124/84 | HR 79 | Ht 61.0 in | Wt 117.0 lb

## 2022-02-19 DIAGNOSIS — K5901 Slow transit constipation: Secondary | ICD-10-CM

## 2022-02-19 DIAGNOSIS — I25119 Atherosclerotic heart disease of native coronary artery with unspecified angina pectoris: Secondary | ICD-10-CM

## 2022-02-19 DIAGNOSIS — N1831 Chronic kidney disease, stage 3a: Secondary | ICD-10-CM

## 2022-02-19 DIAGNOSIS — I10 Essential (primary) hypertension: Secondary | ICD-10-CM | POA: Diagnosis not present

## 2022-02-19 DIAGNOSIS — R7303 Prediabetes: Secondary | ICD-10-CM

## 2022-02-19 DIAGNOSIS — M5432 Sciatica, left side: Secondary | ICD-10-CM

## 2022-02-19 DIAGNOSIS — J449 Chronic obstructive pulmonary disease, unspecified: Secondary | ICD-10-CM

## 2022-02-19 DIAGNOSIS — E039 Hypothyroidism, unspecified: Secondary | ICD-10-CM

## 2022-02-19 DIAGNOSIS — D6869 Other thrombophilia: Secondary | ICD-10-CM

## 2022-02-19 DIAGNOSIS — I7409 Other arterial embolism and thrombosis of abdominal aorta: Secondary | ICD-10-CM

## 2022-02-19 DIAGNOSIS — K219 Gastro-esophageal reflux disease without esophagitis: Secondary | ICD-10-CM

## 2022-02-19 MED ORDER — GABAPENTIN 100 MG PO CAPS: 200.0000 mg | ORAL_CAPSULE | Freq: Every day | ORAL | 1 refills | Status: DC

## 2022-02-19 MED ORDER — PANTOPRAZOLE SODIUM 40 MG PO TBEC: 40.0000 mg | DELAYED_RELEASE_TABLET | Freq: Two times a day (BID) | ORAL | 1 refills | Status: DC

## 2022-02-19 MED ORDER — LEVOTHYROXINE SODIUM 88 MCG PO TABS: 88.0000 ug | ORAL_TABLET | Freq: Every day | ORAL | 1 refills | Status: DC

## 2022-02-19 NOTE — Progress Notes (Signed)
Date:  02/19/2022   Name:  [name]   DOB:  1934-11-02   MRN:  [id number]   Chief Complaint: Hypertension, Gastroesophageal Reflux, and Hypothyroidism  Hypertension This is a chronic problem. The problem is controlled. Associated symptoms include neck pain and shortness of breath. Pertinent negatives include no chest pain, headaches or palpitations. Past treatments include beta blockers, diuretics and angiotensin blockers (Entresto dose decreased several weeks ago due to lightheadedness). Hypertensive end-organ damage includes kidney disease and CAD/MI. Identifiable causes of hypertension include a thyroid problem.  Diabetes She presents for her follow-up diabetic visit. Diabetes type: prediabetes. Her disease course has been stable. Pertinent negatives for hypoglycemia include no dizziness, headaches or nervousness/anxiousness. Pertinent negatives for diabetes include no chest pain and no fatigue.  Thyroid Problem Presents for follow-up visit. Symptoms include constipation. Patient reports no anxiety, fatigue or palpitations. The symptoms have been stable.  Gastroesophageal Reflux She complains of heartburn. She reports no chest pain, no coughing or no wheezing. This is a recurrent problem. The problem occurs occasionally. Pertinent negatives include no fatigue. She has tried a PPI for the symptoms.  Constipation This is a recurrent problem. The problem is unchanged. Her stool frequency is 2 to 3 times per week. The stool is described as formed. The patient is not on a high fiber diet. She Does not exercise regularly. There has Been adequate water intake. Associated symptoms include back pain. Pertinent negatives include no fever. She has tried stool softeners for the symptoms. The treatment provided no relief.  Back Pain This is a recurrent problem. The pain is present in the sacro-iliac. The quality of the pain is described as aching. The pain does not radiate. The pain is mild. The  symptoms are aggravated by sitting and twisting. Pertinent negatives include no chest pain, fever or headaches. She has tried heat (Tylenol and topical agents; on gabapentin daily) for the symptoms.  CKD - most recent BMP showed improved RF and normal K+. Her dose of lasix is unchanged. Afib - staying in SR on Amiodarone.  On Warfarin anticoagulation without bleeding. COPD - followed by Pulmonary - no recent visits.  On duonebs every 6 hours.  No recent pulmonary infections.  Lab Results  Component Value Date   NA 145 12/03/2021   K 3.8 12/03/2021   CO2 38 (A) 12/03/2021   GLUCOSE 113 (H) 08/21/2021   BUN 20 12/03/2021   CREATININE 0.9 12/03/2021   CALCIUM 9.1 08/21/2021   EGFR 60 12/03/2021   GFRNONAA 55 (L) 03/09/2021   Lab Results  Component Value Date   CHOL 153 08/21/2021   HDL 46 08/21/2021   LDLCALC 92 08/21/2021   TRIG 80 08/21/2021   CHOLHDL 3.3 08/21/2021   Lab Results  Component Value Date   TSH 1.390 08/21/2021   Lab Results  Component Value Date   HGBA1C 5.9 (H) 08/21/2021   Lab Results  Component Value Date   WBC 7.4 09/13/2020   HGB 12.2 09/13/2020   HCT 35.4 09/13/2020   MCV 98 (H) 09/13/2020   PLT 154 09/13/2020   Lab Results  Component Value Date   ALT 15 08/21/2021   AST 17 08/21/2021   ALKPHOS 70 08/21/2021   BILITOT 0.5 08/21/2021   No results found for: "25OHVITD2", "25OHVITD3", "VD25OH"   Review of Systems  Constitutional:  Positive for unexpected weight change. Negative for chills, fatigue and fever.  Respiratory:  Positive for shortness of breath. Negative for cough, chest tightness and wheezing.  Cardiovascular:  Negative for chest pain, palpitations and leg swelling.  Gastrointestinal:  Positive for constipation and heartburn. Negative for blood in stool.  Musculoskeletal:  Positive for arthralgias, back pain, gait problem, joint swelling and neck pain.  Neurological:  Positive for light-headedness. Negative for dizziness and  headaches.  Psychiatric/Behavioral:  Positive for sleep disturbance. Negative for dysphoric mood. The patient is not nervous/anxious.     Patient Active Problem List   Diagnosis Date Noted   Gastroesophageal reflux disease 02/19/2022   Sciatica of left side 08/21/2021   Aortoiliac occlusive disease ([place]) 05/22/2021   Cochlear implant status 05/22/2021   Acquired thrombophilia ([place]) 08/11/2020   Stage 3a chronic kidney disease (CKD) ([place]) 08/11/2020   Primary insomnia 07/31/2019   Osteoporosis, postmenopausal 01/23/2018   Coronary artery disease involving native coronary artery of native heart with angina pectoris ([place]) 01/13/2017   S/P placement of cardiac pacemaker 05/02/15, medtronic 05/03/2015   Bradycardia 05/03/2015   Presence of cardiac pacemaker 05/03/2015   S/P mitral valve replacement with bioprosthetic valve, tricuspid valve repair, maze procedure and CABG x1 04/25/2015   S/P tricuspid valve repair 04/25/2015   S/P Maze operation for atrial fibrillation 04/25/2015   S/P CABG x 1 04/25/2015   Pulmonary hypertension (HCC)    Tricuspid regurgitation    Chronic combined systolic and diastolic CHF (congestive heart failure) ([place])    Chronic systolic heart failure ([place]) 11/10/2014   Essential hypertension 11/10/2014   Mitral regurgitation 11/10/2014   Hyperlipidemia 11/10/2014   Atrial fibrillation ([place]) 11/10/2014   Prediabetes 05/08/2014   Auditory vertigo 05/08/2014   Adult hypothyroidism 05/08/2014   Hypercholesterolemia 05/08/2014   H/O adenomatous polyp of colon 05/08/2014   Stage 4 very severe COPD by GOLD classification ([place]) 03/28/2014    Allergies  Allergen Reactions   Ace Inhibitors Cough and Other (See Comments)   Amoxicillin-Pot Clavulanate Swelling and Other (See Comments)    SWOLLEN JOINTS  Has patient had a PCN reaction causing immediate rash, facial/tongue/throat swelling, SOB or lightheadedness with hypotension: Yes Has patient had a PCN reaction  causing severe rash involving mucus membranes or skin necrosis: No Has patient had a PCN reaction that required hospitalization No Has patient had a PCN reaction occurring within the last 10 years: No If all of the above answers are "NO", then may proceed with Cephalosporin   Penicillins Swelling and Other (See Comments)    SWOLLEN JOINTS  Has patient had a PCN reaction causing immediate rash, facial/tongue/throat swelling, SOB or lightheadedness with hypotension: Yes Has patient had a PCN reaction causing severe rash involving mucus membranes or skin necrosis: No Has patient had a PCN reaction that required hospitalization No Has patient had a PCN reaction occurring within the last 10 years: No If all of the above answers are "NO", then may proceed with Cephalosporin use.  Swollen joints   Rosuvastatin Other (See Comments)   Nifedipine Other (See Comments)    UNSPECIFIED UNSPECIFIED    Propranolol Other (See Comments)    UNSPECIFIED UNSPECIFIED    Diazepam Other (See Comments)    Made her "hyper"    Meperidine Other (See Comments)    Made her "hyper"     Propoxyphene Other (See Comments)    Made her "hyper"      Past Surgical History:  Procedure Laterality Date   APPENDECTOMY     APPLICATION OF A-CELL OF CHEST/ABDOMEN N/A 11/21/2015   Procedure: APPLICATION OF A-CELL OF CHEST/ABDOMEN;  Surgeon: Loel Lofty Dillingham, DO;  Location: [facility]  OR;  Service: Clinical cytogeneticist;  Laterality: N/A;   APPLICATION OF A-CELL OF CHEST/ABDOMEN Right 12/21/2015   Procedure: APPLICATION OF A-CELL OF RIGHT CHEST;  Surgeon: Loel Lofty Dillingham, DO;  Location: [place];  Service: Plastics;  Laterality: Right;   APPLICATION OF A-CELL OF CHEST/ABDOMEN Right 01/11/2016   Procedure: APPLICATION OF A-CELL OF RIGHT CHEST;  Surgeon: Loel Lofty Dillingham, DO;  Location: [place];  Service: Plastics;  Laterality: Right;   APPLICATION OF A-CELL OF CHEST/ABDOMEN Right 02/12/2016   Procedure: APPLICATION OF A-CELL TO RIGHT CHEST  WOUND;  Surgeon: Loel Lofty Dillingham, DO;  Location: [place];  Service: Plastics;  Laterality: Right;   APPLICATION OF WOUND VAC N/A 05/09/2015   Procedure: APPLICATION OF WOUND VAC;  Surgeon: Rexene Alberts, MD;  Location: [place];  Service: Thoracic;  Laterality: N/A;   APPLICATION OF WOUND VAC N/A 11/13/2015   Procedure: APPLICATION OF WOUND VAC;  Surgeon: Rexene Alberts, MD;  Location: [place];  Service: Thoracic;  Laterality: N/A;   APPLICATION OF WOUND VAC N/A 11/21/2015   Procedure: APPLICATION OF WOUND VAC;  Surgeon: Loel Lofty Dillingham, DO;  Location: [place];  Service: Plastics;  Laterality: N/A;   APPLICATION OF WOUND VAC Right 12/21/2015   Procedure: APPLICATION OF WOUND VAC to right chest wall ;  Surgeon: Loel Lofty Dillingham, DO;  Location: [place];  Service: Plastics;  Laterality: Right;   APPLICATION OF WOUND VAC Right 01/11/2016   Procedure: APPLICATION OF WOUND VAC RIGHT CHEST ;  Surgeon: Loel Lofty Dillingham, DO;  Location: [place];  Service: Plastics;  Laterality: Right;   APPLICATION OF WOUND VAC Right 01/24/2016   Procedure: APPLICATION OF WOUND VAC RIGHT CHEST WALL;  Surgeon: Loel Lofty Dillingham, DO;  Location: [place];  Service: Plastics;  Laterality: Right;   APPLICATION OF WOUND VAC Right 02/12/2016   Procedure: RE-APPLICATION OF WOUND VAC TO RIGHT CHEST WOUND;  Surgeon: Loel Lofty Dillingham, DO;  Location: [place];  Service: Plastics;  Laterality: Right;   BIV UPGRADE N/A 06/07/2019   Procedure: BIV UPGRADE;  Surgeon: Deboraha Sprang, MD;  Location: [place] [facility];  Service: Cardiovascular;  Laterality: N/A;   BREAST BIOPSY Left 11/25/06   neg   BREAST BIOPSY Left 01/20/12   /clip-neg   CARDIAC CATHETERIZATION  11/2013   [facility]   CARDIAC CATHETERIZATION  10/2014   [facility]   CARDIAC VALVE REPLACEMENT     CARDIOVERSION N/A 07/20/2018   Procedure: CARDIOVERSION;  Surgeon: Wellington Hampshire, MD;  Location: [facility];  Service: Cardiovascular;  Laterality: N/A;   CARDIOVERSION N/A 09/18/2020    Procedure: CARDIOVERSION;  Surgeon: Minna Merritts, MD;  Location: [facility];  Service: Cardiovascular;  Laterality: N/A;   CATARACT EXTRACTION W/ INTRAOCULAR LENS  IMPLANT, BILATERAL Bilateral 2014   CLIPPING OF ATRIAL APPENDAGE N/A 04/25/2015   Procedure: CLIPPING OF ATRIAL APPENDAGE;  Surgeon: Rexene Alberts, MD;  Location: [place];  Service: Open Heart Surgery;  Laterality: N/A;   COCHLEAR IMPLANT Left 2005?   COLONOSCOPY WITH PROPOFOL N/A 01/20/2018   Procedure: COLONOSCOPY WITH PROPOFOL;  Surgeon: [place], Benay Pike, MD;  Location: [facility];  Service: Gastroenterology;  Laterality: N/A;   CORONARY ANGIOPLASTY     CORONARY ARTERY BYPASS GRAFT N/A 04/25/2015   Procedure: CORONARY ARTERY BYPASS GRAFTING (CABG) x ONE, using left internal mammary artery;  Surgeon: Rexene Alberts, MD;  Location: [place];  Service: Open Heart Surgery;  Laterality: N/A;   DILATION AND CURETTAGE OF UTERUS  "  several before hysterectomy"   EP IMPLANTABLE DEVICE N/A 05/02/2015   Procedure: Pacemaker Implant;  Surgeon: Thompson Grayer, MD;  Location: [place] [facility];  Service: Cardiovascular;  Laterality: N/A;   ESOPHAGOGASTRODUODENOSCOPY (EGD) WITH PROPOFOL N/A 01/20/2018   Procedure: ESOPHAGOGASTRODUODENOSCOPY (EGD) WITH PROPOFOL;  Surgeon: [place], Benay Pike, MD;  Location: [facility];  Service: Gastroenterology;  Laterality: N/A;   EYE SURGERY     I & D EXTREMITY Right 12/06/2015   Procedure: IRRIGATION AND DEBRIDEMENT RIGHT CHEST WALL WITH ACELL PLACEMENT AND VAC;  Surgeon: Loel Lofty Dillingham, DO;  Location[place];  Service: Plastics;  Laterality: Right;   INCISION AND DRAINAGE OF WOUND N/A 11/21/2015   Procedure: IRRIGATION AND DEBRIDEMENT WOUND;  Surgeon: Loel Lofty Dillingham, DO;  Location: [place];  Service: Plastics;  Laterality: N/A;   INCISION AND DRAINAGE OF WOUND Right 12/21/2015   Procedure: IRRIGATION AND DEBRIDEMENT right chest wall WOUND;  Surgeon: Loel Lofty Dillingham, DO;  Location: [place];  Service:  Plastics;  Laterality: Right;  right chest wall    INCISION AND DRAINAGE OF WOUND Right 01/24/2016   Procedure: IRRIGATION AND DEBRIDEMENT RIGHT CHEST WALL WOUND;  Surgeon: Loel Lofty Dillingham, DO;  Location: [place];  Service: Plastics;  Laterality: Right;   INCISION AND DRAINAGE OF WOUND Right 03/28/2016   Procedure: IRRIGATION AND DEBRIDEMENT RIGHT CHEST WALL WOUND WITH A Cell Placement;  Surgeon: Loel Lofty Dillingham, DO;  Location: [place];  Service: Plastics;  Laterality: Right;   INCISION AND DRAINAGE OF WOUND Right 05/17/2016   Procedure: IRRIGATION AND DEBRIDEMENT OF RIGHT CHEST WOUND WITH A CELL PLACEMENT;  Surgeon: Loel Lofty Dillingham, DO;  Location: [place];  Service: Plastics;  Laterality: Right;   INSERT / REPLACE / REMOVE PACEMAKER     IRRIGATION AND DEBRIDEMENT OF WOUND WITH SPLIT THICKNESS SKIN GRAFT Right 01/11/2016   Procedure: IRRIGATION AND DEBRIDEMENT OF RIGHT CHEST WOUND ;  Surgeon: Loel Lofty Dillingham, DO;  Location: [place];  Service: Plastics;  Laterality: Right;   MASTECTOMY, PARTIAL Right 2002   positive, partial   MAZE N/A 04/25/2015   Procedure: MAZE;  Surgeon: Rexene Alberts, MD;  Location: [place];  Service: Open Heart Surgery;  Laterality: N/A;   MITRAL VALVE REPAIR N/A 04/25/2015   Procedure: MITRAL VALVE  REPLACEMENT using a 27 mm Edwards Perimount Magna Mitral Ease Valve;  Surgeon: Rexene Alberts, MD;  Location: [place];  Service: Open Heart Surgery;  Laterality: N/A;   RIGHT HEART CATH N/A 02/08/2019   Procedure: RIGHT HEART CATH;  Surgeon: Wellington Hampshire, MD;  Location: [place] [facility];  Service: Cardiovascular;  Laterality: N/A;   SKIN SPLIT GRAFT Right 02/12/2016   Procedure: IRRIGATION AND DEBRIDEMENT RIGHT CHEST WOUND;  Surgeon: Loel Lofty Dillingham, DO;  Location: [place];  Service: Plastics;  Laterality: Right;   STERNAL WIRES REMOVAL N/A 06/05/2015   Procedure: STERNAL WIRES REMOVAL;  Surgeon: Rexene Alberts, MD;  Location: [place];  Service: Thoracic;  Laterality: N/A;    STERNAL WOUND DEBRIDEMENT N/A 05/09/2015   Procedure: STERNAL WOUND DEBRIDEMENT;  Surgeon: Rexene Alberts, MD;  Location: [place];  Service: Thoracic;  Laterality: N/A;   STERNAL WOUND DEBRIDEMENT N/A 11/13/2015   Procedure: Excisional drainage of RIGHT Chest wall mass and breast mass ;  Surgeon: Rexene Alberts, MD;  Location: [place];  Service: Thoracic;  Laterality: N/A;   TEE WITHOUT CARDIOVERSION N/A 04/25/2015   Procedure: TRANSESOPHAGEAL ECHOCARDIOGRAM (TEE);  Surgeon: Rexene Alberts,  MD;  [facility];  Service: Open Heart Surgery;  Laterality: N/A;   TONSILLECTOMY     TRAM Right 11/18/2015   Procedure: VRAM Vertical Rectus Abdominus Muscle Flap;  Surgeon: Loel Lofty Dillingham, DO;  Location: [place];  Service: Plastics;  Laterality: Right;  RIght Back   TRICUSPID VALVE REPLACEMENT N/A 04/25/2015   Procedure: TRICUSPID VALVE REPAIR;  Surgeon: Rexene Alberts, MD;  Location: [place];  Service: Open Heart Surgery;  Laterality: N/A;   VAGINAL HYSTERECTOMY      Social History   Tobacco Use   Smoking status: Former    Packs/day: 1.50    Years: 40.00    Total pack years: 60.00    Types: Cigarettes    Quit date: 04/20/1998    Years since quitting: 23.8   Smokeless tobacco: Never  Vaping Use   Vaping Use: Never used  Substance Use Topics   Alcohol use: Yes    Comment: 11/10/2015 "glass of wine a few times/year, if that"   Drug use: No     Medication list has been reviewed and updated.  Current Meds  Medication Sig   acetaminophen (TYLENOL) 500 MG tablet Take 1,000 mg by mouth every 6 (six) hours as needed (for pain.).   amiodarone (PACERONE) 200 MG tablet Take 1 tablet (200 mg total) by mouth daily.   bisoprolol (ZEBETA) 5 MG tablet Take 0.5 tablets (2.5 mg total) by mouth daily.   diphenhydrAMINE (BENADRYL) 25 mg capsule Take 25 mg by mouth every 6 (six) hours as needed for allergies.   docusate sodium (COLACE) 100 MG capsule Take 100 mg by mouth 2 (two) times daily as needed  (constipation.).   furosemide (LASIX) 20 MG tablet TAKE 2 TABLETS (40 MG) IN THE MORNING AND 1 TABLET (20) MG IN THE EVENING.   gabapentin (NEURONTIN) 100 MG capsule TAKE 2 CAPSULES BY MOUTH AT BEDTIME.   ipratropium-albuterol (DUONEB) 0.5-2.5 (3) MG/3ML SOLN Inhale 3 mLs into the lungs every 6 (six) hours as needed.   levothyroxine (SYNTHROID) 88 MCG tablet TAKE 1 TABLET BY MOUTH DAILY BEFORE BREAKFAST.   Multiple Vitamins-Minerals (PRESERVISION AREDS 2 PO) Take 1 tablet by mouth 2 (two) times daily.    OXYGEN Inhale 2 L into the lungs continuous.   pantoprazole (PROTONIX) 40 MG tablet TAKE 1 TABLET BY MOUTH TWICE A DAY   sacubitril-valsartan (ENTRESTO) 49-51 MG Take 0.5 tablet by mouth TWICE daily   simvastatin (ZOCOR) 10 MG tablet TAKE 1 TABLET BY MOUTH EVERYDAY AT BEDTIME   spironolactone (ALDACTONE) 25 MG tablet TAKE 1 TABLET BY MOUTH EVERY DAY   warfarin (COUMADIN) 2.5 MG tablet TAKE AS DIRECTED PER COUMADIN CLINIC       02/19/2022    8:16 AM 08/21/2021    8:23 AM 05/22/2021    3:10 PM  GAD 7 : Generalized Anxiety Score  Nervous, Anxious, on Edge 0 0 0  Control/stop worrying 0 0 0  Worry too much - different things 0 0 0  Trouble relaxing 0 0 0  Restless 0 0 0  Easily annoyed or irritable 0 0 0  Afraid - awful might happen 0 0 0  Total GAD 7 Score 0 0 0  Anxiety Difficulty Not difficult at all Not difficult at all        02/19/2022    8:16 AM  Depression screen PHQ 2/9  Decreased Interest 0  Down, Depressed, Hopeless 0  PHQ - 2 Score 0  Altered sleeping 0  Change in  appetite 1  Feeling bad or failure about yourself  0  Trouble concentrating 0  Moving slowly or fidgety/restless 0  Suicidal thoughts 0  PHQ-9 Score 1  Difficult doing work/chores Not difficult at all    BP Readings from Last 3 Encounters:  02/19/22 124/84  02/05/22 (!) 114/52  08/21/21 112/78    Physical Exam Vitals and nursing note reviewed.  Constitutional:      General: She is not in acute  distress.    Appearance: She is well-developed.  HENT:     Head: Normocephalic and atraumatic.  Cardiovascular:     Rate and Rhythm: Normal rate and regular rhythm.  Pulmonary:     Effort: Pulmonary effort is normal. No respiratory distress.     Breath sounds: Decreased air movement present. Decreased breath sounds present. No wheezing or rhonchi.  Abdominal:     General: Abdomen is flat.     Palpations: Abdomen is soft.     Tenderness: There is no abdominal tenderness.  Musculoskeletal:     Lumbar back: Negative right straight leg raise test and negative left straight leg raise test.       Back:     Comments: Tender over right SI region  Lymphadenopathy:     Cervical: No cervical adenopathy.  Skin:    General: Skin is warm and dry.     Findings: No rash.  Neurological:     Mental Status: She is alert and oriented to person, place, and time.     Motor: No weakness (in either LE).  Psychiatric:        Mood and Affect: Mood normal.        Behavior: Behavior normal.     Wt Readings from Last 3 Encounters:  02/19/22 117 lb (53.1 kg)  02/05/22 121 lb (54.9 kg)  08/21/21 120 lb (54.4 kg)    BP 124/84   Pulse 79   Ht _0  (1.549 m)   Wt 117 lb (53.1 kg)   SpO2 (!) 85%   BMI 22.11 kg/m   Assessment and Plan: 1. Essential hypertension Clinically stable exam with well controlled BP. Tolerating medications without side effects at this time. Pt to continue current regimen and low sodium diet;  Contact Cardiology for Entresto dose change - CBC with Differential/Platelet  2. Prediabetes Continue to limit carbs but avoid further weight loss - Hemoglobin A1c  3. Stage 3a chronic kidney disease (CKD) ([place]) Improved last check - will continue to monitor - Comprehensive metabolic panel  4. Adult hypothyroidism supplemented - TSH + free T4 - levothyroxine (SYNTHROID) 88 MCG tablet; Take 1 tablet (88 mcg total) by mouth daily before breakfast.  Dispense: 90 tablet;  Refill: 1  5. Gastroesophageal reflux disease, unspecified whether esophagitis present Symptoms well controlled on daily PPI No red flag signs such as n/v, melena Will continue Protonix bid. - CBC with Differential/Platelet - pantoprazole (PROTONIX) 40 MG tablet; Take 1 tablet (40 mg total) by mouth 2 (two) times daily.  Dispense: 180 tablet; Refill: 1  6. Stage 4 very severe COPD by GOLD classification ([place]) On continuous O2 and Duo-nebs  7. Acquired thrombophilia ([place]) Due to Warfarin Last INR therapeutic No bleeding  8. Atherosclerosis of native coronary artery of native heart with angina pectoris ([place]) stable  9. Aortoiliac occlusive disease ([place])  10. Sciatica of left side Continue gabapentin; add tylenol 650 mg up to 4 per day Continue heat and rubs to right SI region - gabapentin (NEURONTIN) 100 MG capsule;  Take 2 capsules (200 mg total) by mouth at bedtime.  Dispense: 180 capsule; Refill: 1  11. Slow transit constipation Continue stool softener Add Miralax daily   Partially dictated using Editor, commissioning. Any errors are unintentional.  Halina Maidens, MD [facility]  02/19/2022

## 2022-02-19 NOTE — Telephone Encounter (Signed)
Pt c/o medication issue:  1. Name of Medication:   sacubitril-valsartan (ENTRESTO) 49-51 MG    2. How are you currently taking this medication (dosage and times per day)? Take 0.5 tablet by mouth TWICE daily  3. Are you having a reaction (difficulty breathing--STAT)? No  4. What is your medication issue? Pt states that she was told to call and let Dr. Caryl Comes know how she was doing with the medication. Pt states that she would rather go back to taking a whole tablet. Please advise

## 2022-02-20 LAB — CBC WITH DIFFERENTIAL/PLATELET
Basophils Absolute: 0 10*3/uL (ref 0.0–0.2)
Basos: 0 %
EOS (ABSOLUTE): 0 10*3/uL (ref 0.0–0.4)
Eos: 0 %
Hematocrit: 30.6 % — ABNORMAL LOW (ref 34.0–46.6)
Hemoglobin: 9.9 g/dL — ABNORMAL LOW (ref 11.1–15.9)
Immature Grans (Abs): 0 10*3/uL (ref 0.0–0.1)
Immature Granulocytes: 0 %
Lymphocytes Absolute: 0.6 10*3/uL — ABNORMAL LOW (ref 0.7–3.1)
Lymphs: 9 %
MCH: 30.2 pg (ref 26.6–33.0)
MCHC: 32.4 g/dL (ref 31.5–35.7)
MCV: 93 fL (ref 79–97)
Monocytes Absolute: 0.6 10*3/uL (ref 0.1–0.9)
Monocytes: 8 %
Neutrophils Absolute: 5.5 10*3/uL (ref 1.4–7.0)
Neutrophils: 83 %
Platelets: 169 10*3/uL (ref 150–450)
RBC: 3.28 x10E6/uL — ABNORMAL LOW (ref 3.77–5.28)
RDW: 14.2 % (ref 11.7–15.4)
WBC: 6.7 10*3/uL (ref 3.4–10.8)

## 2022-02-20 LAB — TSH+FREE T4
Free T4: 2.37 ng/dL — ABNORMAL HIGH (ref 0.82–1.77)
TSH: 0.992 u[IU]/mL (ref 0.450–4.500)

## 2022-02-20 LAB — COMPREHENSIVE METABOLIC PANEL
ALT: 13 IU/L (ref 0–32)
AST: 13 IU/L (ref 0–40)
Albumin/Globulin Ratio: 1.7 (ref 1.2–2.2)
Albumin: 4.2 g/dL (ref 3.6–4.6)
Alkaline Phosphatase: 69 IU/L (ref 44–121)
BUN/Creatinine Ratio: 18 (ref 12–28)
BUN: 17 mg/dL (ref 8–27)
Bilirubin Total: 0.4 mg/dL (ref 0.0–1.2)
CO2: 31 mmol/L — ABNORMAL HIGH (ref 20–29)
Calcium: 9.1 mg/dL (ref 8.7–10.3)
Chloride: 100 mmol/L (ref 96–106)
Creatinine, Ser: 0.96 mg/dL (ref 0.57–1.00)
Globulin, Total: 2.5 g/dL (ref 1.5–4.5)
Glucose: 115 mg/dL — ABNORMAL HIGH (ref 70–99)
Potassium: 3.7 mmol/L (ref 3.5–5.2)
Sodium: 145 mmol/L — ABNORMAL HIGH (ref 134–144)
Total Protein: 6.7 g/dL (ref 6.0–8.5)
eGFR: 58 mL/min/{1.73_m2} — ABNORMAL LOW (ref >59)

## 2022-02-20 LAB — HEMOGLOBIN A1C
Est. average glucose Bld gHb Est-mCnc: 117 mg/dL
Hgb A1c MFr Bld: 5.7 % — ABNORMAL HIGH (ref 4.8–5.6)

## 2022-02-21 LAB — IRON AND TIBC
Iron Saturation: 12 % — ABNORMAL LOW (ref 15–55)
Iron: 38 ug/dL (ref 27–139)
Total Iron Binding Capacity: 324 ug/dL (ref 250–450)
UIBC: 286 ug/dL (ref 118–369)

## 2022-02-21 LAB — VITAMIN B12: Vitamin B-12: 388 pg/mL (ref 232–1245)

## 2022-02-21 LAB — SPECIMEN STATUS REPORT

## 2022-02-21 LAB — FERRITIN: Ferritin: 43 ng/mL (ref 15–150)

## 2022-02-26 ENCOUNTER — Ambulatory Visit (INDEPENDENT_AMBULATORY_CARE_PROVIDER_SITE_OTHER): Payer: Medicare Other | Admitting: Medical

## 2022-02-26 ENCOUNTER — Encounter: Payer: Self-pay | Admitting: Medical

## 2022-02-26 ENCOUNTER — Other Ambulatory Visit: Payer: Self-pay | Admitting: Cardiovascular Disease

## 2022-02-26 VITALS — BP 130/54 | HR 68 | Ht 61.0 in | Wt 118.0 lb

## 2022-02-26 DIAGNOSIS — E782 Mixed hyperlipidemia: Secondary | ICD-10-CM

## 2022-02-26 DIAGNOSIS — Z950 Presence of cardiac pacemaker: Secondary | ICD-10-CM

## 2022-02-26 DIAGNOSIS — Z953 Presence of xenogenic heart valve: Secondary | ICD-10-CM | POA: Diagnosis not present

## 2022-02-26 DIAGNOSIS — I5022 Chronic systolic (congestive) heart failure: Secondary | ICD-10-CM | POA: Diagnosis not present

## 2022-02-26 DIAGNOSIS — I25119 Atherosclerotic heart disease of native coronary artery with unspecified angina pectoris: Secondary | ICD-10-CM | POA: Diagnosis not present

## 2022-02-26 DIAGNOSIS — I4819 Other persistent atrial fibrillation: Secondary | ICD-10-CM

## 2022-02-26 NOTE — Patient Instructions (Signed)
Medication Instructions:   Your physician recommends that you continue on your current medications as directed. Please refer to the Current Medication list given to you today.  *If you need a refill on your cardiac medications before your next appointment, please call your pharmacy*  Please consider [facility]   Lab Work:  None ordered  Testing/Procedures:  None ordered   Follow-Up: At [facility], you and your health needs are our priority.  As part of our continuing mission to provide you with exceptional heart care, we have created designated Provider Care Teams.  These Care Teams include your primary Cardiologist (physician) and Advanced Practice Providers (APPs -  Physician Assistants and Nurse Practitioners) who all work together to provide you with the care you need, when you need it.  We recommend signing up for the patient portal called "MyChart".  Sign up information is provided on this After Visit Summary.  MyChart is used to connect with patients for Virtual Visits (Telemedicine).  Patients are able to view lab/test results, encounter notes, upcoming appointments, etc.  Non-urgent messages can be sent to your provider as well.   To learn more about what you can do with MyChart, go to [email or web address].    Your next appointment:   6 month(s)  The format for your next appointment:   In Person  Provider:   You may see Kathlyn [place], MD or one of the following Advanced Practice Providers on your designated Care Team:   Murray Hodgkins, NP Christell Faith, PA-C Cadence Kathlen Mody, PA-C{   Important Information About Sugar

## 2022-02-26 NOTE — Progress Notes (Unsigned)
Cardiology Office Note:    Date:  02/26/2022   ID:  [name], DOB 12/09/1934, MRN [id number]  PCP:  Glean Hess, MD  [facility] Cardiologist:  Kathlyn Sacramento, MD  [facility] Electrophysiologist:  Virl Axe, MD   Referring MD: Glean Hess, MD   Chief Complaint: 6 month follow-up  History of Present Illness:    [name] is a 87 y.o. 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   She underwent mitral valve replacement with a bioprosthetic valve, tricuspid valve repair, one-vessel CABG with LIMA to LAD and maze procedure in August 2016. This was complicated by sternal wound infection which required debridement. She also had complete heart block and underwent permanent pacemaker placement.   In 08/2017, she had worsening heart failure.  Echocardiogram showed an EF of 30 to 35%. She was seen by EP and it was felt that LV dysfunction was in the context of RV apical pacing. That was inactivated and she noted immediate improvement in symptoms. She had worsening SOB in 2019 and echo was ordered, which showed a drop in EF 20-25% in the setting of afib with RVR. She was rate controlled and started on amiodarone and subsequently underwent cardioversion with restoration of NSR. EF improved to 35-40% after that.   Right heart catheterization was done in June 2020 which showed moderately elevated filling pressures, severe pulmonary hypertension and mildly reduced cardiac output.  There was no evidence of left-to-right intracardiac shunting by saturation run.  Prominent V waves were noted on pulmonary capillary wedge pressure tracing suggestive of significant mitral regurgitation.  Symptoms improved after increasing furosemide.   She underwent attempted upgrade of her pacemaker to a biventricular device but the procedure was not successful due to inability to  place an LV lead.   She had recurrent atrial fibrillation in January 2021 that required cardioversion.  She has been in sinus rhythm since then.    Saw Dr. Bosie Helper 02/05/22 and was in normal rhythm on amiodarone.   Today, the patinet reports chronic fatigue. Doesn't feel like it's worse. She had an infusion for osteoporosis and has not felt good since then. She is on 2L O2, this is baseline. Denies chest pain. She has occasional lower leg edema. No orthopnea or pnd. She takes lasix 40 in the am and '20mg'$  in the pm.  Needs refill of entresto.   Past Medical History:  Diagnosis Date   Acute on chronic respiratory failure with hypoxia ([place]) 02/04/2019   Added automatically from request for surgery [phone]   Acute on chronic systolic heart failure ([place])    Anxiety    Arthritis    "hands" (11/10/2015)   Breast cancer ([place]) 2002   right   CAD s/p CABG x 1    a. 04/2015 LIMA to LAD   Chronic combined systolic (congestive) and diastolic (congestive) heart failure ([place])    a. 01/2015 Echo: EF 50-55%, Gr2 DD (preop valve surgery); b. 08/2015 Echo: EF 30-35%, diff HK. Gr2 DD; c. 09/2016 Echo: EF 50-55%; d. 07/2018 Echo: EF 20-25%; e. 03/2019 Echo: EF 35-40%, diff HK. Mod red RV fxn, RVSP 34.27mHg. Mildly dil LA. Nl MV prosthesis. Triv AI.   COPD (chronic obstructive pulmonary disease) (HCC)    Coronary artery disease    GERD (gastroesophageal reflux disease)    History of colon polyps    History of mitral  valve replacement    a. 04/2015 s/p 27 mm [facility] Mitral bovine bioprosthetic tissue valve   Hyperlipidemia    Hypertension    Hypothyroidism    Maze operation for AF w/ LAA clipping    a. 04/2015 Complete bilateral atrial lesion set using cryothermy and bipolar radiofrequency ablation with clipping of LA appendage   Meniere disease    Meniere's disease    On home oxygen therapy    "2L at night" (11/10/2015)   PAF (paroxysmal atrial fibrillation) ([place])    Personal history of radiation therapy     Pneumonia ~ 2010   PONV (postoperative nausea and vomiting)    Pt felt like eardrums were gonna pop   Post-surgical complete heart block, symptomatic    a. 04/2015 s/p MDT [id number] Advisa DR MRI DC PPM.   Presence of permanent cardiac pacemaker    Pulmonary hypertension ([place])    Respiratory failure ([place]) 02/22/2019   Surgical wound, non healing - chest wall 11/10/2015   Tricuspid Regurgitation s/p Repair    a. 04/2015 s/p 26 mm Edwards mc3 ring annuloplasty   Wound infection 10/13/2015   Superficial sternal wound infection    Past Surgical History:  Procedure Laterality Date   APPENDECTOMY     APPLICATION OF A-CELL OF CHEST/ABDOMEN N/A 11/21/2015   Procedure: APPLICATION OF A-CELL OF CHEST/ABDOMEN;  Surgeon: Loel Lofty Dillingham, DO;  Location: [place];  Service: Plastics;  Laterality: N/A;   APPLICATION OF A-CELL OF CHEST/ABDOMEN Right 12/21/2015   Procedure: APPLICATION OF A-CELL OF RIGHT CHEST;  Surgeon: Loel Lofty Dillingham, DO;  Location: [place];  Service: Plastics;  Laterality: Right;   APPLICATION OF A-CELL OF CHEST/ABDOMEN Right 01/11/2016   Procedure: APPLICATION OF A-CELL OF RIGHT CHEST;  Surgeon: Loel Lofty Dillingham, DO;  Location: [place];  Service: Plastics;  Laterality: Right;   APPLICATION OF A-CELL OF CHEST/ABDOMEN Right 02/12/2016   Procedure: APPLICATION OF A-CELL TO RIGHT CHEST WOUND;  Surgeon: Loel Lofty Dillingham, DO;  Location: [place];  Service: Plastics;  Laterality: Right;   APPLICATION OF WOUND VAC N/A 05/09/2015   Procedure: APPLICATION OF WOUND VAC;  Surgeon: Rexene Alberts, MD;  Location: [place];  Service: Thoracic;  Laterality: N/A;   APPLICATION OF WOUND VAC N/A 11/13/2015   Procedure: APPLICATION OF WOUND VAC;  Surgeon: Rexene Alberts, MD;  Location: [place];  Service: Thoracic;  Laterality: N/A;   APPLICATION OF WOUND VAC N/A 11/21/2015   Procedure: APPLICATION OF WOUND VAC;  Surgeon: Loel Lofty Dillingham, DO;  Location: [place];  Service: Plastics;  Laterality: N/A;   APPLICATION OF  WOUND VAC Right 12/21/2015   Procedure: APPLICATION OF WOUND VAC to right chest wall ;  Surgeon: Loel Lofty Dillingham, DO;  Location: [place];  Service: Plastics;  Laterality: Right;   APPLICATION OF WOUND VAC Right 01/11/2016   Procedure: APPLICATION OF WOUND VAC RIGHT CHEST ;  Surgeon: Loel Lofty Dillingham, DO;  Location: [place];  Service: Plastics;  Laterality: Right;   APPLICATION OF WOUND VAC Right 01/24/2016   Procedure: APPLICATION OF WOUND VAC RIGHT CHEST WALL;  Surgeon: Loel Lofty Dillingham, DO;  Location: [place];  Service: Plastics;  Laterality: Right;   APPLICATION OF WOUND VAC Right 02/12/2016   Procedure: RE-APPLICATION OF WOUND VAC TO RIGHT CHEST WOUND;  Surgeon: Loel Lofty Dillingham, DO;  Location: [place];  Service: Plastics;  Laterality: Right;   BIV UPGRADE N/A 06/07/2019   Procedure: BIV UPGRADE;  Surgeon: Deboraha Sprang,  MD;  Location: [place] CV [facility];  Service: Cardiovascular;  Laterality: N/A;   BREAST BIOPSY Left 11/25/06   neg   BREAST BIOPSY Left 01/20/12   /clip-neg   CARDIAC CATHETERIZATION  11/2013   [facility]   CARDIAC CATHETERIZATION  10/2014   [facility]   CARDIAC VALVE REPLACEMENT     CARDIOVERSION N/A 07/20/2018   Procedure: CARDIOVERSION;  Surgeon: Wellington Hampshire, MD;  Location: [facility];  Service: Cardiovascular;  Laterality: N/A;   CARDIOVERSION N/A 09/18/2020   Procedure: CARDIOVERSION;  Surgeon: Minna Merritts, MD;  Location: [facility];  Service: Cardiovascular;  Laterality: N/A;   CATARACT EXTRACTION W/ INTRAOCULAR LENS  IMPLANT, BILATERAL Bilateral 2014   CLIPPING OF ATRIAL APPENDAGE N/A 04/25/2015   Procedure: CLIPPING OF ATRIAL APPENDAGE;  Surgeon: Rexene Alberts, MD;  Location: [place];  Service: Open Heart Surgery;  Laterality: N/A;   COCHLEAR IMPLANT Left 2005?   COLONOSCOPY WITH PROPOFOL N/A 01/20/2018   Procedure: COLONOSCOPY WITH PROPOFOL;  Surgeon: Toledo, Benay Pike, MD;  Location: [facility];  Service: Gastroenterology;  Laterality: N/A;   CORONARY ANGIOPLASTY      CORONARY ARTERY BYPASS GRAFT N/A 04/25/2015   Procedure: CORONARY ARTERY BYPASS GRAFTING (CABG) x ONE, using left internal mammary artery;  Surgeon: Rexene Alberts, MD;  Location: [place];  Service: Open Heart Surgery;  Laterality: N/A;   DILATION AND CURETTAGE OF UTERUS  "several before hysterectomy"   EP IMPLANTABLE DEVICE N/A 05/02/2015   Procedure: Pacemaker Implant;  Surgeon: Thompson Grayer, MD;  Location: [place] CV [facility];  Service: Cardiovascular;  Laterality: N/A;   ESOPHAGOGASTRODUODENOSCOPY (EGD) WITH PROPOFOL N/A 01/20/2018   Procedure: ESOPHAGOGASTRODUODENOSCOPY (EGD) WITH PROPOFOL;  Surgeon: Toledo, Benay Pike, MD;  Location: [facility];  Service: Gastroenterology;  Laterality: N/A;   EYE SURGERY     I & D EXTREMITY Right 12/06/2015   Procedure: IRRIGATION AND DEBRIDEMENT RIGHT CHEST WALL WITH ACELL PLACEMENT AND VAC;  Surgeon: Loel Lofty Dillingham, DO;  Location: [place];  Service: Plastics;  Laterality: Right;   INCISION AND DRAINAGE OF WOUND N/A 11/21/2015   Procedure: IRRIGATION AND DEBRIDEMENT WOUND;  Surgeon: Loel Lofty Dillingham, DO;  Location: [place];  Service: Plastics;  Laterality: N/A;   INCISION AND DRAINAGE OF WOUND Right 12/21/2015   Procedure: IRRIGATION AND DEBRIDEMENT right chest wall WOUND;  Surgeon: Loel Lofty Dillingham, DO;  Location: [place];  Service: Plastics;  Laterality: Right;  right chest wall    INCISION AND DRAINAGE OF WOUND Right 01/24/2016   Procedure: IRRIGATION AND DEBRIDEMENT RIGHT CHEST WALL WOUND;  Surgeon: Loel Lofty Dillingham, DO;  Location: [place];  Service: Plastics;  Laterality: Right;   INCISION AND DRAINAGE OF WOUND Right 03/28/2016   Procedure: IRRIGATION AND DEBRIDEMENT RIGHT CHEST WALL WOUND WITH A Cell Placement;  Surgeon: Loel Lofty Dillingham, DO;  Location: [place];  Service: Plastics;  Laterality: Right;   INCISION AND DRAINAGE OF WOUND Right 05/17/2016   Procedure: IRRIGATION AND DEBRIDEMENT OF RIGHT CHEST WOUND WITH A CELL PLACEMENT;  Surgeon: Loel Lofty Dillingham, DO;  Location: [place];  Service: Plastics;  Laterality: Right;   INSERT / REPLACE / REMOVE PACEMAKER     IRRIGATION AND DEBRIDEMENT OF WOUND WITH SPLIT THICKNESS SKIN GRAFT Right 01/11/2016   Procedure: IRRIGATION AND DEBRIDEMENT OF RIGHT CHEST WOUND ;  Surgeon: Loel Lofty Dillingham, DO;  Location: [place];  Service: Plastics;  Laterality: Right;   MASTECTOMY, PARTIAL Right 2002   positive, partial   MAZE N/A  04/25/2015   Procedure: MAZE;  Surgeon: Rexene Alberts, MD;  Location: [place];  Service: Open Heart Surgery;  Laterality: N/A;   MITRAL VALVE REPAIR N/A 04/25/2015   Procedure: MITRAL VALVE  REPLACEMENT using a 27 mm Edwards Perimount Magna Mitral Ease Valve;  Surgeon: Rexene Alberts, MD;  Location: [place];  Service: Open Heart Surgery;  Laterality: N/A;   RIGHT HEART CATH N/A 02/08/2019   Procedure: RIGHT HEART CATH;  Surgeon: Wellington Hampshire, MD;  Location: [place] [facility];  Service: Cardiovascular;  Laterality: N/A;   SKIN SPLIT GRAFT Right 02/12/2016   Procedure: IRRIGATION AND DEBRIDEMENT RIGHT CHEST WOUND;  Surgeon: Loel Lofty Dillingham, DO;  Location: [place];  Service: Plastics;  Laterality: Right;   STERNAL WIRES REMOVAL N/A 06/05/2015   Procedure: STERNAL WIRES REMOVAL;  Surgeon: Rexene Alberts, MD;  Location: [place];  Service: Thoracic;  Laterality: N/A;   STERNAL WOUND DEBRIDEMENT N/A 05/09/2015   Procedure: STERNAL WOUND DEBRIDEMENT;  Surgeon: Rexene Alberts, MD;  Location: [place];  Service: Thoracic;  Laterality: N/A;   STERNAL WOUND DEBRIDEMENT N/A 11/13/2015   Procedure: Excisional drainage of RIGHT Chest wall mass and breast mass ;  Surgeon: Rexene Alberts, MD;  Location: [place];  Service: Thoracic;  Laterality: N/A;   TEE WITHOUT CARDIOVERSION N/A 04/25/2015   Procedure: TRANSESOPHAGEAL ECHOCARDIOGRAM (TEE);  Surgeon: Rexene Alberts, MD;  Location: [place];  Service: Open Heart Surgery;  Laterality: N/A;   TONSILLECTOMY     TRAM Right 11/18/2015   Procedure: VRAM  Vertical Rectus Abdominus Muscle Flap;  Surgeon: Loel Lofty Dillingham, DO;  Location: [place];  Service: Plastics;  Laterality: Right;  RIght Back   TRICUSPID VALVE REPLACEMENT N/A 04/25/2015   Procedure: TRICUSPID VALVE REPAIR;  Surgeon: Rexene Alberts, MD;  Location: [place];  Service: Open Heart Surgery;  Laterality: N/A;   VAGINAL HYSTERECTOMY      Current Medications: No outpatient medications have been marked as taking for the 02/26/22 encounter (Appointment) with Kathlen Mody, Nayeli Calvert H, PA-C.     Allergies:   Ace inhibitors, Amoxicillin-pot clavulanate, Penicillins, Rosuvastatin, Nifedipine, Propranolol, Diazepam, Meperidine, and Propoxyphene   Social History   Socioeconomic History   Marital status: Married    Spouse name: Not on file   Number of children: 0   Years of education: Not on file   Highest education level: Not on file  Occupational History   Not on file  Tobacco Use   Smoking status: Former    Packs/day: 1.50    Years: 40.00    Total pack years: 60.00    Types: Cigarettes    Quit date: 04/20/1998    Years since quitting: 23.8   Smokeless tobacco: Never  Vaping Use   Vaping Use: Never used  Substance and Sexual Activity   Alcohol use: Yes    Comment: 11/10/2015 "glass of wine a few times/year, if that"   Drug use: No   Sexual activity: Yes  Other Topics Concern   Not on file  Social History Narrative   Not on file   Social Determinants of Health   Financial Resource Strain: Low Risk  (10/01/2021)   Overall Financial Resource Strain (CARDIA)    Difficulty of Paying Living Expenses: Not hard at all  Food Insecurity: No Food Insecurity (10/01/2021)   Hunger Vital Sign    Worried About Running Out of Food in the Last Year: Never true    Ran Out of Food in  the Last Year: Never true  Transportation Needs: No Transportation Needs (10/01/2021)   PRAPARE - Hydrologist (Medical): No    Lack of Transportation (Non-Medical): No  Physical  Activity: Inactive (10/01/2021)   Exercise Vital Sign    Days of Exercise per Week: 0 days    Minutes of Exercise per Session: 0 min  Stress: No Stress Concern Present (10/01/2021)   [place]    Feeling of Stress : Not at all  Social Connections: Moderately Isolated (10/01/2021)   Social Connection and Isolation Panel [NHANES]    Frequency of Communication with Friends and Family: More than three times a week    Frequency of Social Gatherings with Friends and Family: Three times a week    Attends Religious Services: Never    Active Member of Clubs or Organizations: No    Attends Archivist Meetings: Never    Marital Status: Married     Family History: The patient's ***family history includes Breast cancer in her maternal aunt and paternal aunt; CVA in her mother; Heart attack in her paternal uncle; Heart disease in her brother and father.  ROS:   Please see the history of present illness.    *** All other systems reviewed and are negative.  EKGs/Labs/Other Studies Reviewed:    The following studies were reviewed today: ***  EKG:  EKG is *** ordered today.  The ekg ordered today demonstrates ***  Recent Labs: 02/19/2022: ALT 13; BUN 17; Creatinine, Ser 0.96; Hemoglobin 9.9; Platelets 169; Potassium 3.7; Sodium 145; TSH 0.992  Recent Lipid Panel    Component Value Date/Time   CHOL 153 08/21/2021 0856   TRIG 80 08/21/2021 0856   HDL 46 08/21/2021 0856   CHOLHDL 3.3 08/21/2021 0856   LDLCALC 92 08/21/2021 0856    Physical Exam:    VS:  There were no vitals taken for this visit.    Wt Readings from Last 3 Encounters:  02/19/22 117 lb (53.1 kg)  02/05/22 121 lb (54.9 kg)  08/21/21 120 lb (54.4 kg)     GEN: *** Well nourished, well developed in no acute distress HEENT: Normal NECK: No JVD; No carotid bruits LYMPHATICS: No lymphadenopathy CARDIAC: ***RRR, no murmurs, rubs, gallops RESPIRATORY:   Clear to auscultation without rales, wheezing or rhonchi  ABDOMEN: Soft, non-tender, non-distended MUSCULOSKELETAL:  No edema; No deformity  SKIN: Warm and dry NEUROLOGIC:  Alert and oriented x 3 PSYCHIATRIC:  Normal affect   ASSESSMENT:    No diagnosis found. PLAN:    In order of problems listed above:  HfrEF Trace lower leg edema on lasix '40mg'$ /'20mg'$ . Continue Entresto , bisoprolol and spiro. Discussed Wilder Glade, but will wait. Patient is in the donought hole next month. They will call regarding Farxiga.    S/p MV replacement with bioprosthetic valve  CAD s/p CABG x1 LIMA to LAD She denies anginal symptoms  Persistent Afib In NSR on amiodarone.   HLD  S/p PPM Followed by Dr. Caryl Comes.    Disposition: Follow up in 6 month(s) with MD/APP      Signed, Treon Kehl Ninfa Meeker, PA-C  02/26/2022 2:02 PM    [place]

## 2022-03-01 ENCOUNTER — Other Ambulatory Visit: Payer: Self-pay | Admitting: Internal Medicine

## 2022-03-01 ENCOUNTER — Other Ambulatory Visit: Payer: Self-pay | Admitting: Cardiovascular Disease

## 2022-03-01 ENCOUNTER — Telehealth: Payer: Self-pay | Admitting: Internal Medicine

## 2022-03-01 DIAGNOSIS — E039 Hypothyroidism, unspecified: Secondary | ICD-10-CM

## 2022-03-01 MED ORDER — ENTRESTO 49-51 MG PO TABS: ORAL_TABLET | ORAL | 3 refills | Status: DC

## 2022-03-07 ENCOUNTER — Ambulatory Visit (INDEPENDENT_AMBULATORY_CARE_PROVIDER_SITE_OTHER): Payer: Medicare Other

## 2022-03-07 DIAGNOSIS — I442 Atrioventricular block, complete: Secondary | ICD-10-CM

## 2022-03-07 LAB — CUP PACEART REMOTE DEVICE CHECK
Battery Remaining Longevity: 34 mo
Battery Voltage: 2.95 V
Brady Statistic AP VP Percent: 99.46 %
Brady Statistic AP VS Percent: 0.06 %
Brady Statistic AS VP Percent: 0.48 %
Brady Statistic AS VS Percent: 0 %
Brady Statistic RA Percent Paced: 99.51 %
Brady Statistic RV Percent Paced: 99.92 %
Date Time Interrogation Session: 20230629142322
Implantable Lead Implant Date: 20160823
Implantable Lead Implant Date: 20160823
Implantable Lead Location: 753859
Implantable Lead Location: 753860
Implantable Lead Model: 5076
Implantable Lead Model: 5076
Implantable Pulse Generator Implant Date: 20160823
Lead Channel Impedance Value: 342 Ohm
Lead Channel Impedance Value: 361 Ohm
Lead Channel Impedance Value: 380 Ohm
Lead Channel Impedance Value: 380 Ohm
Lead Channel Pacing Threshold Amplitude: 0.625 V
Lead Channel Pacing Threshold Amplitude: 0.625 V
Lead Channel Pacing Threshold Pulse Width: 0.4 ms
Lead Channel Pacing Threshold Pulse Width: 0.4 ms
Lead Channel Sensing Intrinsic Amplitude: 1 mV
Lead Channel Sensing Intrinsic Amplitude: 1 mV
Lead Channel Sensing Intrinsic Amplitude: 13.125 mV
Lead Channel Sensing Intrinsic Amplitude: 13.125 mV
Lead Channel Setting Pacing Amplitude: 1.5 V
Lead Channel Setting Pacing Amplitude: 2 V
Lead Channel Setting Pacing Pulse Width: 0.4 ms
Lead Channel Setting Sensing Sensitivity: 2.8 mV

## 2022-03-07 NOTE — Telephone Encounter (Signed)
Noted - I spoke with the patient on 03/01/22- see phone note and have advised the patient to continue Entresto 49-'51mg'$  BID. A refill was sent to her pharmacy.

## 2022-03-15 ENCOUNTER — Ambulatory Visit (INDEPENDENT_AMBULATORY_CARE_PROVIDER_SITE_OTHER): Payer: Medicare Other | Admitting: Primary Care

## 2022-03-15 ENCOUNTER — Encounter: Payer: Self-pay | Admitting: Primary Care

## 2022-03-15 DIAGNOSIS — J9611 Chronic respiratory failure with hypoxia: Secondary | ICD-10-CM | POA: Diagnosis not present

## 2022-03-15 DIAGNOSIS — J449 Chronic obstructive pulmonary disease, unspecified: Secondary | ICD-10-CM

## 2022-03-15 NOTE — Progress Notes (Signed)
$'@Patient'Z$  ID: [name], female    DOB: [birth year], 87 y.o.   MRN: [id number]  Chief Complaint  Patient presents with   Follow-up    Occ SOB with exertion.     Referring provider: Glean Hess, MD  HPI: 87 year old female.  Past medical history significant for severe COPD (Gold stage D), chronic hypoxic respiratory failure, pulmonary hypertension, A-fib on amiodarone, heart failure.  Patient of Dr. Mortimer Fries, last seen in office on 64/6/22.  03/15/2022 Patient presents today for annual follow-up/severe COPD. Breathing is baseline, no significant changes over the last year. No recent exacerbations or hospitalizations. She uses duoneb on average twice daily for congestion. When she lays down at night she as a slight cough that goes away shortly.  She experiences shortness of breath with exertion only (long walks or inclines). She is able to do most all activites at home, she is not very limited by her breathing. She lives in a one floor home. She wears 2L oxygen continuously. She has POC, recently replaced. [employer] is her durable medication equipment company.    Allergies  Allergen Reactions   Ace Inhibitors Cough and Other (See Comments)   Amoxicillin-Pot Clavulanate Swelling and Other (See Comments)    SWOLLEN JOINTS  Has patient had a PCN reaction causing immediate rash, facial/tongue/throat swelling, SOB or lightheadedness with hypotension: Yes Has patient had a PCN reaction causing severe rash involving mucus membranes or skin necrosis: No Has patient had a PCN reaction that required hospitalization No Has patient had a PCN reaction occurring within the last 10 years: No If all of the above answers are "NO", then may proceed with Cephalosporin   Penicillins Swelling and Other (See Comments)    SWOLLEN JOINTS  Has patient had a PCN reaction causing immediate rash, facial/tongue/throat swelling, SOB or lightheadedness with hypotension: Yes Has patient had a PCN reaction causing severe  rash involving mucus membranes or skin necrosis: No Has patient had a PCN reaction that required hospitalization No Has patient had a PCN reaction occurring within the last 10 years: No If all of the above answers are "NO", then may proceed with Cephalosporin use.  Swollen joints   Rosuvastatin Other (See Comments)   Nifedipine Other (See Comments)    UNSPECIFIED UNSPECIFIED    Propranolol Other (See Comments)    UNSPECIFIED UNSPECIFIED    Diazepam Other (See Comments)    Made her "hyper"    Meperidine Other (See Comments)    Made her "hyper"     Propoxyphene Other (See Comments)    Made her "hyper"      Immunization History  Administered Date(s) Administered   Fluad Quad(high Dose 65+) 05/22/2021   Influenza, High Dose Seasonal PF 06/27/2015, 06/12/2016, 06/10/2017, 04/30/2018, 06/23/2020   Influenza, Seasonal, Injecte, Preservative Fre 06/21/2015   Influenza-Unspecified 06/20/2012, 06/16/2013, 06/28/2014   PFIZER Comirnaty(Gray Top)Covid-19 Tri-Sucrose Vaccine 05/23/2020   PFIZER(Purple Top)SARS-COV-2 Vaccination 09/16/2019, 10/07/2019   Pfizer Covid-19 Vaccine Bivalent Booster 49yr & up 07/05/2021   Pneumococcal Conjugate-13 07/22/2016   Pneumococcal Polysaccharide-23 09/09/1992, 09/25/2005, 09/19/2014   Tdap 07/18/2017   Zoster Recombinat (Shingrix) 04/02/2018, 07/14/2018    Past Medical History:  Diagnosis Date   Acute on chronic respiratory failure with hypoxia ([place] 02/04/2019   Added automatically from request for surgery [id number]  Acute on chronic systolic heart failure ([facility]    Anxiety    Arthritis    "hands" (11/10/2015)   [place]   right   CAD s/p CABG  x 1    a. 04/2015 [facility] to LAD   Chronic combined systolic (congestive) and diastolic (congestive) heart failure ([place])    a. 01/2015 Echo: EF 50-55%, Gr2 DD (preop valve surgery); b. 08/2015 Echo: EF 30-35%, diff HK. Gr2 DD; c. 09/2016 Echo: EF 50-55%; d. 07/2018 Echo: EF 20-25%; e. 03/2019  Echo: EF 35-40%, diff HK. Mod red RV fxn, RVSP 34.70mHg. Mildly dil LA. Nl MV prosthesis. Triv AI.   COPD (chronic obstructive pulmonary disease) (HCC)    Coronary artery disease    GERD (gastroesophageal reflux disease)    History of colon polyps    History of mitral valve replacement    [facility]   Hyperlipidemia    Hypertension    Hypothyroidism    Maze operation for AF w/ LAA clipping    a. 04/2015 Complete bilateral atrial lesion set using cryothermy and bipolar radiofrequency ablation with clipping of LA appendage   Meniere disease    Meniere's disease    On home oxygen therapy    "2L at night" (11/10/2015)   PAF (paroxysmal atrial fibrillation) ([facility]    Personal history of radiation therapy    Pneumonia ~ 2010   PONV (postoperative nausea and vomiting)    Pt felt like eardrums were gonna pop   Post-surgical complete heart block, symptomatic    a. 04/2015 s/p MDT [id number] DR MRI DC PPM.   Presence of permanent cardiac pacemaker    Pulmonary hypertension (HCC)    [place] 02/22/[place]   Surgical wound, non healing - chest wall 11/10/2015   Tricuspid Regurgitation s/p Repair    a. 04/2015 s/p 26 mm Edwards mc3 ring annuloplasty   Wound infection 10/13/2015   Superficial sternal wound infection    Tobacco History: Social History   Tobacco Use  Smoking Status Former   Packs/day: 1.50   Years: 40.00   Total pack years: 60.00   Types: Cigarettes   Quit date: 04/20/1998   Years since quitting: 23.9  Smokeless Tobacco Never   Counseling given: Not Answered   Outpatient Medications Prior to Visit  Medication Sig Dispense Refill   acetaminophen (TYLENOL) 500 MG tablet Take 1,000 mg by mouth every 6 (six) hours as needed (for pain.).     amiodarone (PACERONE) 200 MG tablet TAKE 1 TABLET BY MOUTH EVERY DAY 90 tablet 3   bisoprolol (ZEBETA) 5 MG tablet Take 0.5 tablets (2.5 mg total) by mouth  daily. 45 tablet 1   Cyanocobalamin (VITAMIN B-12 PO) Take 1,000 mcg by mouth daily.     diphenhydrAMINE (BENADRYL) 25 mg capsule Take 25 mg by mouth every 6 (six) hours as needed for allergies.     docusate sodium (COLACE) 100 MG capsule Take 100 mg by mouth 2 (two) times daily as needed (constipation.).     Ferrous Sulfate (IRON PO) Take 1 tablet by mouth daily.     furosemide (LASIX) 20 MG tablet TAKE 2 TABLETS (40 MG) IN THE MORNING AND 1 TABLET (20) MG IN THE EVENING. 90 tablet 0   gabapentin (NEURONTIN) 100 MG capsule Take 2 capsules (200 mg total) by mouth at bedtime. 180 capsule 1   ipratropium-albuterol (DUONEB) 0.5-2.5 (3) MG/3ML SOLN Inhale 3 mLs into the lungs every 6 (six) hours as needed. 120 mL 11   levothyroxine (SYNTHROID) 88 MCG tablet Take 1 tablet (88 mcg total) by mouth daily before breakfast. 90 tablet 1   Multiple Vitamins-Minerals (  PRESERVISION AREDS 2 PO) Take 1 tablet by mouth 2 (two) times daily.      OXYGEN Inhale 2 L into the lungs continuous.     pantoprazole (PROTONIX) 40 MG tablet Take 1 tablet (40 mg total) by mouth 2 (two) times daily. 180 tablet 1   sacubitril-valsartan (ENTRESTO) 49-51 MG Take 1 tablet (49/51 mg) by mouth twice daily 180 tablet 3   spironolactone (ALDACTONE) 25 MG tablet TAKE 1 TABLET BY MOUTH EVERY DAY 90 tablet 0   warfarin (COUMADIN) 2.5 MG tablet TAKE AS DIRECTED PER COUMADIN CLINIC 100 tablet 1   simvastatin (ZOCOR) 10 MG tablet TAKE 1 TABLET BY MOUTH EVERYDAY AT BEDTIME 30 tablet 0   No facility-administered medications prior to visit.   Review of Systems  Review of Systems  Constitutional: Negative.   HENT: Negative.    Respiratory:  Negative for cough and shortness of breath.    Physical Exam  BP 116/60 (BP Location: Left Arm, Cuff Size: Normal)   Pulse 82   Temp 97.7 F (36.5 C) (Temporal)   Ht '5\' 1"'$  (1.549 m)   Wt 117 lb (53.1 kg)   SpO2 94%   BMI 22.11 kg/m  Physical Exam Constitutional:      Appearance: Normal  appearance.  HENT:     Mouth/Throat:     Mouth: Mucous membranes are moist.     Pharynx: Oropharynx is clear.  Cardiovascular:     Rate and Rhythm: Normal rate and regular rhythm.  Pulmonary:     Effort: Pulmonary effort is normal.     Breath sounds: Normal breath sounds. No wheezing, rhonchi or rales.     Comments: 2L oxygen POC Neurological:     General: No focal deficit present.     Mental Status: She is alert and oriented to person, place, and time. Mental status is at baseline.  Psychiatric:        Mood and Affect: Mood normal.        Behavior: Behavior normal.        Thought Content: Thought content normal.        Judgment: Judgment normal.      Lab Results:  CBC    Component Value Date/Time   WBC 8.2 04/01/2022 1446   WBC 7.0 02/26/2019 0300   RBC 3.66 (L) 04/01/2022 1446   RBC 2.25 (L) 02/26/2019 0300   HGB 11.7 04/01/2022 1446   HCT 36.0 04/01/2022 1446   PLT 169 04/01/2022 1446   MCV 98 (H) 04/01/2022 1446   MCH 32.0 04/01/2022 1446   MCH 35.6 (H) 02/26/2019 0300   MCHC 32.5 04/01/2022 1446   MCHC 33.2 02/26/2019 0300   RDW 14.8 04/01/2022 1446   LYMPHSABS 0.9 04/01/2022 1446   MONOABS 0.4 02/26/2019 0300   EOSABS 0.1 04/01/2022 1446   BASOSABS 0.0 04/01/2022 1446    BMET    Component Value Date/Time   NA 145 (H) 02/19/2022 0905   K 3.7 02/19/2022 0905   CL 100 02/19/2022 0905   CO2 31 (H) 02/19/2022 0905   GLUCOSE 115 (H) 02/19/2022 0905   GLUCOSE 170 (H) 03/09/2021 0851   BUN 17 02/19/2022 0905   CREATININE 0.96 02/19/2022 0905   CALCIUM 9.1 02/19/2022 0905   GFRNONAA 55 (L) 03/09/2021 0851   GFRAA 75 09/13/2020 1120    BNP    Component Value Date/Time   BNP 806.0 (H) 02/22/2019 0950    ProBNP No results found for: "PROBNP"  Imaging: No results found.  Assessment & Plan:   Stage 4 very severe COPD by GOLD classification ([place]) - Stable; Breathing is baseline. No recent exacerbations or hospitalizations. Continue  ipratropium-albuterol nebulizer q 6 hours as needed for breakthrough sob/wheezing. Advised patient to notify office if she develop worsening respiratory symptoms or purulent mucus. FU in 12 months with Dr. Mortimer Fries or sooner if needed.  Chronic respiratory failure with hypoxia (HCC) - Continues to benefit from 2L supplemental oxygen 24/7. Goal maintain O2 > 88 to 90%.    Martyn Ehrich, NP 04/08/2022

## 2022-03-15 NOTE — Patient Instructions (Signed)
You looked well today Ms. [name], it was very nice to meet you No changes today  Recommendations Continue ipratropium-albuterol nebulizer as needed every 6 hours for shortness of breath or cough Continue to wear 2 L of oxygen continuously to maintain O2 greater than 88 to 90% Notify office if you develop worsening respiratory symptoms or purulent mucus  Follow-up 1 year with Dr. Mortimer Fries or sooner if needed

## 2022-03-19 LAB — CUP PACEART INCLINIC DEVICE CHECK
Battery Remaining Longevity: 33 mo
Battery Voltage: 2.95 V
Brady Statistic AP VP Percent: 97.71 %
Brady Statistic AP VS Percent: 0.04 %
Brady Statistic AS VP Percent: 2.23 %
Brady Statistic AS VS Percent: 0.02 %
Brady Statistic RA Percent Paced: 97.32 %
Brady Statistic RV Percent Paced: 99.92 %
Date Time Interrogation Session: 20230530095700
Implantable Lead Implant Date: 20160823
Implantable Lead Implant Date: 20160823
Implantable Lead Location: 753859
Implantable Lead Location: 753860
Implantable Lead Model: 5076
Implantable Lead Model: 5076
Implantable Pulse Generator Implant Date: 20160823
Lead Channel Impedance Value: 342 Ohm
Lead Channel Impedance Value: 342 Ohm
Lead Channel Impedance Value: 380 Ohm
Lead Channel Impedance Value: 380 Ohm
Lead Channel Pacing Threshold Amplitude: 0.5 V
Lead Channel Pacing Threshold Amplitude: 0.625 V
Lead Channel Pacing Threshold Pulse Width: 0.4 ms
Lead Channel Pacing Threshold Pulse Width: 0.4 ms
Lead Channel Sensing Intrinsic Amplitude: 0.875 mV
Lead Channel Sensing Intrinsic Amplitude: 1.25 mV
Lead Channel Sensing Intrinsic Amplitude: 11.75 mV
Lead Channel Sensing Intrinsic Amplitude: 13.75 mV
Lead Channel Setting Pacing Amplitude: 1.5 V
Lead Channel Setting Pacing Amplitude: 2 V
Lead Channel Setting Pacing Pulse Width: 0.4 ms
Lead Channel Setting Sensing Sensitivity: 2.8 mV

## 2022-03-25 NOTE — Progress Notes (Signed)
Remote pacemaker transmission.   

## 2022-03-26 ENCOUNTER — Other Ambulatory Visit: Payer: Self-pay | Admitting: Cardiovascular Disease

## 2022-03-27 ENCOUNTER — Ambulatory Visit (INDEPENDENT_AMBULATORY_CARE_PROVIDER_SITE_OTHER): Payer: Medicare Other

## 2022-03-27 DIAGNOSIS — Z5181 Encounter for therapeutic drug level monitoring: Secondary | ICD-10-CM

## 2022-03-27 DIAGNOSIS — I340 Nonrheumatic mitral (valve) insufficiency: Secondary | ICD-10-CM

## 2022-03-27 DIAGNOSIS — Z953 Presence of xenogenic heart valve: Secondary | ICD-10-CM | POA: Diagnosis not present

## 2022-03-27 LAB — POCT INR: INR: 4 — AB (ref 2.0–3.0)

## 2022-03-27 NOTE — Patient Instructions (Signed)
-   HOLD TODAY AND THURSDAY and then continue 1 tablet every day, EXCEPT 0.5 TABLET ON WEDNESDAYS.  - Recheck INR in 3 weeks.

## 2022-03-28 ENCOUNTER — Telehealth: Payer: Self-pay | Admitting: Internal Medicine

## 2022-03-28 NOTE — Telephone Encounter (Signed)
Called pt let her know that she can keep her upcoming appointment. Pt verbalized understanding.  KP

## 2022-03-28 NOTE — Telephone Encounter (Signed)
Copied from [place] [id number]. Topic: General - Inquiry >> Mar 28, 2022  2:22 PM Marcellus Scott wrote: Reason for CRM: Pt stated has been on Steroids Prednisone about 8 days for Sciatic nerve. Pt stated yesterday she had a blood thinner check and it was high, and she was advised it's because she was on steroids.  Pt would like to know if her upcoming lab recheck appointment would be affected by the steroids. Pt stated she would finish her medication tomorrow. Pt would like to know if Dr.Burglund would like for her to keep her appointment for Monday or reschedule it.  Please advise.

## 2022-04-01 ENCOUNTER — Encounter: Payer: Self-pay | Admitting: Internal Medicine

## 2022-04-01 ENCOUNTER — Ambulatory Visit (INDEPENDENT_AMBULATORY_CARE_PROVIDER_SITE_OTHER): Payer: Medicare Other | Admitting: Internal Medicine

## 2022-04-01 VITALS — BP 128/52 | HR 84 | Ht 61.0 in | Wt 116.0 lb

## 2022-04-01 DIAGNOSIS — D6869 Other thrombophilia: Secondary | ICD-10-CM | POA: Diagnosis not present

## 2022-04-01 DIAGNOSIS — I25119 Atherosclerotic heart disease of native coronary artery with unspecified angina pectoris: Secondary | ICD-10-CM

## 2022-04-01 DIAGNOSIS — D509 Iron deficiency anemia, unspecified: Secondary | ICD-10-CM

## 2022-04-01 DIAGNOSIS — E538 Deficiency of other specified B group vitamins: Secondary | ICD-10-CM | POA: Diagnosis not present

## 2022-04-01 NOTE — [place] Notes (Signed)
Date:  04/01/2022   Name:  [name]   DOB:  July 28, 1935   MRN:  [id number]   Chief Complaint: Anemia  Anemia Presents for follow-up visit. Symptoms include bruises/bleeds easily. There has been no abdominal pain, light-headedness, malaise/fatigue, palpitations, pica or weight loss. Signs of blood loss that are not present include hematemesis, hematochezia, melena and vaginal bleeding. There are no compliance problems (taking both B12 and Iron).    AFib - on Warfarin for anticoagulation.  Recent INR was elevated above therapeutic.  She has not seen any blood in her stool.  Lab Results  Component Value Date   NA 145 (H) 02/19/2022   K 3.7 02/19/2022   CO2 31 (H) 02/19/2022   GLUCOSE 115 (H) 02/19/2022   BUN 17 02/19/2022   CREATININE 0.96 02/19/2022   CALCIUM 9.1 02/19/2022   EGFR 58 (L) 02/19/2022   GFRNONAA 55 (L) 03/09/2021   Lab Results  Component Value Date   CHOL 153 08/21/2021   HDL 46 08/21/2021   LDLCALC 92 08/21/2021   TRIG 80 08/21/2021   CHOLHDL 3.3 08/21/2021   Lab Results  Component Value Date   TSH 0.992 02/19/2022   Lab Results  Component Value Date   HGBA1C 5.7 (H) 02/19/2022   Lab Results  Component Value Date   WBC 6.7 02/19/2022   HGB 9.9 (L) 02/19/2022   HCT 30.6 (L) 02/19/2022   MCV 93 02/19/2022   PLT 169 02/19/2022   Lab Results  Component Value Date   ALT 13 02/19/2022   AST 13 02/19/2022   ALKPHOS 69 02/19/2022   BILITOT 0.4 02/19/2022   No results found for: "25OHVITD2", "25OHVITD3", "VD25OH"   Review of Systems  Constitutional:  Negative for chills, fatigue, malaise/fatigue, unexpected weight change and weight loss.  HENT:  Positive for hearing loss and tinnitus. Negative for nosebleeds.   Eyes:  Negative for visual disturbance.  Respiratory:  Positive for shortness of breath (chronic on O2). Negative for cough, chest tightness and wheezing.   Cardiovascular:  Negative for chest pain, palpitations and leg swelling.   Gastrointestinal:  Negative for abdominal pain, constipation, diarrhea, hematemesis, hematochezia and melena.  Genitourinary:  Negative for hematuria and vaginal bleeding.  Neurological:  Negative for dizziness, weakness, light-headedness and headaches.  Hematological:  Bruises/bleeds easily.    Patient Active Problem List   Diagnosis Date Noted   Gastroesophageal reflux disease 02/19/2022   Sciatica of left side 08/21/2021   Aortoiliac occlusive disease ([place]) 05/22/2021   Cochlear implant status 05/22/2021   Acquired thrombophilia ([place]) 08/11/2020   Stage 3a chronic kidney disease (CKD) ([place]) 08/11/2020   Primary insomnia 07/31/2019   Osteoporosis, postmenopausal 01/23/2018   Coronary artery disease involving native coronary artery of native heart with angina pectoris ([place]) 01/13/2017   S/P placement of cardiac pacemaker 05/02/15, medtronic 05/03/2015   Bradycardia 05/03/2015   Presence of cardiac pacemaker 05/03/2015   S/P mitral valve replacement with bioprosthetic valve, tricuspid valve repair, maze procedure and CABG x1 04/25/2015   S/P tricuspid valve repair 04/25/2015   S/P Maze operation for atrial fibrillation 04/25/2015   S/P CABG x 1 04/25/2015   Pulmonary hypertension (HCC)    Tricuspid regurgitation    Chronic combined systolic and diastolic CHF (congestive heart failure) ([place])    Chronic systolic heart failure ([place]) 11/10/2014   Essential hypertension 11/10/2014   Mitral regurgitation 11/10/2014   Hyperlipidemia 11/10/2014   Atrial fibrillation ([place]) 11/10/2014   Prediabetes 05/08/2014   Auditory vertigo  05/08/2014   Adult hypothyroidism 05/08/2014   Hypercholesterolemia 05/08/2014   H/O adenomatous polyp of colon 05/08/2014   Stage 4 very severe COPD by GOLD classification ([place]) 03/28/2014    Allergies  Allergen Reactions   Ace Inhibitors Cough and Other (See Comments)   Amoxicillin-Pot Clavulanate Swelling and Other (See Comments)    SWOLLEN JOINTS  Has  patient had a PCN reaction causing immediate rash, facial/tongue/throat swelling, SOB or lightheadedness with hypotension: Yes Has patient had a PCN reaction causing severe rash involving mucus membranes or skin necrosis: No Has patient had a PCN reaction that required hospitalization No Has patient had a PCN reaction occurring within the last 10 years: No If all of the above answers are "NO", then may proceed with Cephalosporin   Penicillins Swelling and Other (See Comments)    SWOLLEN JOINTS  Has patient had a PCN reaction causing immediate rash, facial/tongue/throat swelling, SOB or lightheadedness with hypotension: Yes Has patient had a PCN reaction causing severe rash involving mucus membranes or skin necrosis: No Has patient had a PCN reaction that required hospitalization No Has patient had a PCN reaction occurring within the last 10 years: No If all of the above answers are "NO", then may proceed with Cephalosporin use.  Swollen joints   Rosuvastatin Other (See Comments)   Nifedipine Other (See Comments)    UNSPECIFIED UNSPECIFIED    Propranolol Other (See Comments)    UNSPECIFIED UNSPECIFIED    Diazepam Other (See Comments)    Made her "hyper"    Meperidine Other (See Comments)    Made her "hyper"     Propoxyphene Other (See Comments)    Made her "hyper"      Past Surgical History:  Procedure Laterality Date   APPENDECTOMY     APPLICATION OF A-CELL OF CHEST/ABDOMEN N/A 11/21/2015   Procedure: APPLICATION OF A-CELL OF CHEST/ABDOMEN;  Surgeon: Loel Lofty Dillingham, DO;  Location: [place];  Service: Plastics;  Laterality: N/A;   APPLICATION OF A-CELL OF CHEST/ABDOMEN Right 12/21/2015   Procedure: APPLICATION OF A-CELL OF RIGHT CHEST;  Surgeon: Loel Lofty Dillingham, DO;  Location: [place];  Service: Plastics;  Laterality: Right;   APPLICATION OF A-CELL OF CHEST/ABDOMEN Right 01/11/2016   Procedure: APPLICATION OF A-CELL OF RIGHT CHEST;  Surgeon: Loel Lofty Dillingham, DO;   Location: [place];  Service: Plastics;  Laterality: Right;   APPLICATION OF A-CELL OF CHEST/ABDOMEN Right 02/12/2016   Procedure: APPLICATION OF A-CELL TO RIGHT CHEST WOUND;  Surgeon: Loel Lofty Dillingham, DO;  Location: [place];  Service: Plastics;  Laterality: Right;   APPLICATION OF WOUND VAC N/A 05/09/2015   Procedure: APPLICATION OF WOUND VAC;  Surgeon: Rexene Alberts, MD;  Location: [place];  Service: Thoracic;  Laterality: N/A;   APPLICATION OF WOUND VAC N/A 11/13/2015   Procedure: APPLICATION OF WOUND VAC;  Surgeon: Rexene Alberts, MD;  Location: [place];  Service: Thoracic;  Laterality: N/A;   APPLICATION OF WOUND VAC N/A 11/21/2015   Procedure: APPLICATION OF WOUND VAC;  Surgeon: Loel Lofty Dillingham, DO;  Location: [place];  Service: Plastics;  Laterality: N/A;   APPLICATION OF WOUND VAC Right 12/21/2015   Procedure: APPLICATION OF WOUND VAC to right chest wall ;  Surgeon: Loel Lofty Dillingham, DO;  Location: [place];  Service: Plastics;  Laterality: Right;   APPLICATION OF WOUND VAC Right 01/11/2016   Procedure: APPLICATION OF WOUND VAC RIGHT CHEST ;  Surgeon: Loel Lofty Dillingham, DO;  Location: [place];  Service:  Plastics;  Laterality: Right;   APPLICATION OF WOUND VAC Right 01/24/2016   Procedure: APPLICATION OF WOUND VAC RIGHT CHEST WALL;  Surgeon: Loel Lofty Dillingham, DO;  Location: [place];  Service: Plastics;  Laterality: Right;   APPLICATION OF WOUND VAC Right 02/12/2016   Procedure: RE-APPLICATION OF WOUND VAC TO RIGHT CHEST WOUND;  Surgeon: Loel Lofty Dillingham, DO;  Location: [place];  Service: Plastics;  Laterality: Right;   BIV UPGRADE N/A 06/07/2019   Procedure: BIV UPGRADE;  Surgeon: Deboraha Sprang, MD;  Location: [place] [facility];  Service: Cardiovascular;  Laterality: N/A;   BREAST BIOPSY Left 11/25/06   neg   BREAST BIOPSY Left 01/20/12   /clip-neg   CARDIAC CATHETERIZATION  11/2013   [facility]   CARDIAC CATHETERIZATION  10/2014   [facility]   CARDIAC VALVE REPLACEMENT     CARDIOVERSION N/A 07/20/2018    Procedure: CARDIOVERSION;  Surgeon: Wellington Hampshire, MD;  Location: [facility];  Service: Cardiovascular;  Laterality: N/A;   CARDIOVERSION N/A 09/18/2020   Procedure: CARDIOVERSION;  Surgeon: Minna Merritts, MD;  Location: [facility];  Service: Cardiovascular;  Laterality: N/A;   CATARACT EXTRACTION W/ INTRAOCULAR LENS  IMPLANT, BILATERAL Bilateral 2014   CLIPPING OF ATRIAL APPENDAGE N/A 04/25/2015   Procedure: CLIPPING OF ATRIAL APPENDAGE;  Surgeon: Rexene Alberts, MD;  Location: [place];  Service: Open Heart Surgery;  Laterality: N/A;   COCHLEAR IMPLANT Left 2005?   COLONOSCOPY WITH PROPOFOL N/A 01/20/2018   Procedure: COLONOSCOPY WITH PROPOFOL;  Surgeon: Tol[place], Benay Pike, MD;  Location: [facility];  Service: Gastroenterology;  Laterality: N/A;   CORONARY ANGIOPLASTY     CORONARY ARTERY BYPASS GRAFT N/A 04/25/2015   Procedure: CORONARY ARTERY BYPASS GRAFTING (CABG) x ONE, using left internal mammary artery;  Surgeon: Rexene Alberts, MD;  Location: [place];  Service: Open Heart Surgery;  Laterality: N/A;   DILATION AND CURETTAGE OF UTERUS  "several before hysterectomy"   EP IMPLANTABLE DEVICE N/A 05/02/2015   Procedure: Pacemaker Implant;  Surgeon: Thompson Grayer, MD;  Location: [place] [facility];  Service: Cardiovascular;  Laterality: N/A;   ESOPHAGOGASTRODUODENOSCOPY (EGD) WITH PROPOFOL N/A 01/20/2018   Procedure: ESOPHAGOGASTRODUODENOSCOPY (EGD) WITH PROPOFOL;  Surgeon: Toledo, Benay Pike, MD;  Location: [facility];  Service: Gastroenterology;  Laterality: N/A;   EYE SURGERY     I & D EXTREMITY Right 12/06/2015   Procedure: IRRIGATION AND DEBRIDEMENT RIGHT CHEST WALL WITH ACELL PLACEMENT AND VAC;  Surgeon: Loel Lofty Dillingham, DO;  Location: [place];  Service: Plastics;  Laterality: Right;   INCISION AND DRAINAGE OF WOUND N/A 11/21/2015   Procedure: IRRIGATION AND DEBRIDEMENT WOUND;  Surgeon: Loel Lofty Dillingham, DO;  Location: [place];  Service: Plastics;  Laterality: N/A;   INCISION AND  DRAINAGE OF WOUND Right 12/21/2015   Procedure: IRRIGATION AND DEBRIDEMENT right chest wall WOUND;  Surgeon: Loel Lofty Dillingham, DO;  Location: [place];  Service: Plastics;  Laterality: Right;  right chest wall    INCISION AND DRAINAGE OF WOUND Right 01/24/2016   Procedure: IRRIGATION AND DEBRIDEMENT RIGHT CHEST WALL WOUND;  Surgeon: Loel Lofty Dillingham, DO;  Location: [place];  Service: Plastics;  Laterality: Right;   INCISION AND DRAINAGE OF WOUND Right 03/28/2016   Procedure: IRRIGATION AND DEBRIDEMENT RIGHT CHEST WALL WOUND WITH A Cell Placement;  Surgeon: Loel Lofty Dillingham, DO;  Location: [place];  Service: Plastics;  Laterality: Right;   INCISION AND DRAINAGE OF WOUND Right 05/17/2016   Procedure: IRRIGATION AND DEBRIDEMENT  OF RIGHT CHEST WOUND WITH A CELL PLACEMENT;  Surgeon: Loel Lofty Dillingham, DO;  Location: [place];  Service: Plastics;  Laterality: Right;   INSERT / REPLACE / REMOVE PACEMAKER     IRRIGATION AND DEBRIDEMENT OF WOUND WITH SPLIT THICKNESS SKIN GRAFT Right 01/11/2016   Procedure: IRRIGATION AND DEBRIDEMENT OF RIGHT CHEST WOUND ;  Surgeon: Loel Lofty Dillingham, DO;  Location: [place];  Service: Plastics;  Laterality: Right;   MASTECTOMY, PARTIAL Right 2002   positive, partial   MAZE N/A 04/25/2015   Procedure: MAZE;  Surgeon: Rexene Alberts, MD;  Location: [place];  Service: Open Heart Surgery;  Laterality: N/A;   MITRAL VALVE REPAIR N/A 04/25/2015   Procedure: MITRAL VALVE  REPLACEMENT using a 27 mm Edwards Perimount Magna Mitral Ease Valve;  Surgeon: Rexene Alberts, MD;  Location: [place];  Service: Open Heart Surgery;  Laterality: N/A;   RIGHT HEART CATH N/A 02/08/2019   Procedure: RIGHT HEART CATH;  Surgeon: Wellington Hampshire, MD;  Location: [place] [facility];  Service: Cardiovascular;  Laterality: N/A;   SKIN SPLIT GRAFT Right 02/12/2016   Procedure: IRRIGATION AND DEBRIDEMENT RIGHT CHEST WOUND;  Surgeon: Loel Lofty Dillingham, DO;  Location: [place];  Service: Plastics;  Laterality: Right;    STERNAL WIRES REMOVAL N/A 06/05/2015   Procedure: STERNAL WIRES REMOVAL;  Surgeon: Rexene Alberts, MD;  Location: [place];  Service: Thoracic;  Laterality: N/A;   STERNAL WOUND DEBRIDEMENT N/A 05/09/2015   Procedure: STERNAL WOUND DEBRIDEMENT;  Surgeon: Rexene Alberts, MD;  Location: [place];  Service: Thoracic;  Laterality: N/A;   STERNAL WOUND DEBRIDEMENT N/A 11/13/2015   Procedure: Excisional drainage of RIGHT Chest wall mass and breast mass ;  Surgeon: Rexene Alberts, MD;  Location: [place];  Service: Thoracic;  Laterality: N/A;   TEE WITHOUT CARDIOVERSION N/A 04/25/2015   Procedure: TRANSESOPHAGEAL ECHOCARDIOGRAM (TEE);  Surgeon: Rexene Alberts, MD;  Location: [place];  Service: Open Heart Surgery;  Laterality: N/A;   TONSILLECTOMY     TRAM Right 11/18/2015   Procedure: VRAM Vertical Rectus Abdominus Muscle Flap;  Surgeon: Loel Lofty Dillingham, DO;  Location: [place];  Service: Plastics;  Laterality: Right;  RIght Back   TRICUSPID VALVE REPLACEMENT N/A 04/25/2015   Procedure: TRICUSPID VALVE REPAIR;  Surgeon: Rexene Alberts, MD;  Location: [place];  Service: Open Heart Surgery;  Laterality: N/A;   VAGINAL HYSTERECTOMY      Social History   Tobacco Use   Smoking status: Former    Packs/day: 1.50    Years: 40.00    Total pack years: 60.00    Types: Cigarettes    Quit date: 04/20/1998    Years since quitting: 23.9   Smokeless tobacco: Never  Vaping Use   Vaping Use: Never used  Substance Use Topics   Alcohol use: Yes    Comment: 11/10/2015 "glass of wine a few times/year, if that"   Drug use: No     Medication list has been reviewed and updated.  Current Meds  Medication Sig   acetaminophen (TYLENOL) 500 MG tablet Take 1,000 mg by mouth every 6 (six) hours as needed (for pain.).   amiodarone (PACERONE) 200 MG tablet TAKE 1 TABLET BY MOUTH EVERY DAY   bisoprolol (ZEBETA) 5 MG tablet Take 0.5 tablets (2.5 mg total) by mouth daily.   Cyanocobalamin (VITAMIN B-12 PO) Take 1,000 mcg by mouth  daily.   diphenhydrAMINE (BENADRYL) 25 mg capsule Take 25 mg by  mouth every 6 (six) hours as needed for allergies.   docusate sodium (COLACE) 100 MG capsule Take 100 mg by mouth 2 (two) times daily as needed (constipation.).   Ferrous Sulfate (IRON PO) Take 1 tablet by mouth daily.   furosemide (LASIX) 20 MG tablet TAKE 2 TABLETS (40 MG) IN THE MORNING AND 1 TABLET (20) MG IN THE EVENING.   gabapentin (NEURONTIN) 100 MG capsule Take 2 capsules (200 mg total) by mouth at bedtime.   ipratropium-albuterol (DUONEB) 0.5-2.5 (3) MG/3ML SOLN Inhale 3 mLs into the lungs every 6 (six) hours as needed.   levothyroxine (SYNTHROID) 88 MCG tablet Take 1 tablet (88 mcg total) by mouth daily before breakfast.   Multiple Vitamins-Minerals (PRESERVISION AREDS 2 PO) Take 1 tablet by mouth 2 (two) times daily.    OXYGEN Inhale 2 L into the lungs continuous.   pantoprazole (PROTONIX) 40 MG tablet Take 1 tablet (40 mg total) by mouth 2 (two) times daily.   sacubitril-valsartan (ENTRESTO) 49-51 MG Take 1 tablet (49/51 mg) by mouth twice daily   simvastatin (ZOCOR) 10 MG tablet TAKE 1 TABLET BY MOUTH EVERYDAY AT BEDTIME   spironolactone (ALDACTONE) 25 MG tablet TAKE 1 TABLET BY MOUTH EVERY DAY   warfarin (COUMADIN) 2.5 MG tablet TAKE AS DIRECTED PER COUMADIN CLINIC   [DISCONTINUED] predniSONE (DELTASONE) 10 MG tablet 5,5,4,4,3,3,2,2,1,1 tab po tapering dose       04/01/2022    2:18 PM 02/19/2022    8:16 AM 08/21/2021    8:23 AM 05/22/2021    3:10 PM  GAD 7 : Generalized Anxiety Score  Nervous, Anxious, on Edge 0 0 0 0  Control/stop worrying 0 0 0 0  Worry too much - different things 0 0 0 0  Trouble relaxing 0 0 0 0  Restless 0 0 0 0  Easily annoyed or irritable 0 0 0 0  Afraid - awful might happen 0 0 0 0  Total GAD 7 Score 0 0 0 0  Anxiety Difficulty Not difficult at all Not difficult at all Not difficult at all        04/01/2022    2:18 PM 02/19/2022    8:16 AM 10/01/2021    8:20 AM  Depression screen  PHQ 2/9  Decreased Interest 0 0 0  Down, Depressed, Hopeless 0 0 0  PHQ - 2 Score 0 0 0  Altered sleeping 0 0   Tired, decreased energy 1    Change in appetite 0 1   Feeling bad or failure about yourself  0 0   Trouble concentrating 0 0   Moving slowly or fidgety/restless 0 0   Suicidal thoughts 0 0   PHQ-9 Score 1 1   Difficult doing work/chores Not difficult at all Not difficult at all     BP Readings from Last 3 Encounters:  04/01/22 (!) 128/52  03/15/22 116/60  02/26/22 (!) 130/54    Physical Exam Vitals and nursing note reviewed.  Constitutional:      General: She is not in acute distress.    Appearance: Normal appearance. She is well-developed.  HENT:     Head: Normocephalic and atraumatic.  Cardiovascular:     Rate and Rhythm: Normal rate and regular rhythm.  Pulmonary:     Effort: Pulmonary effort is normal. No respiratory distress.     Breath sounds: Decreased air movement present. Decreased breath sounds present. No wheezing or rhonchi.  Musculoskeletal:     Cervical back: Normal range of motion.  Right lower leg: No edema.     Left lower leg: No edema.  Lymphadenopathy:     Cervical: No cervical adenopathy.  Skin:    General: Skin is warm and dry.     Findings: No rash.  Neurological:     Mental Status: She is alert and oriented to person, place, and time.  Psychiatric:        Mood and Affect: Mood normal.        Behavior: Behavior normal.     Wt Readings from Last 3 Encounters:  04/01/22 116 lb (52.6 kg)  03/15/22 117 lb (53.1 kg)  02/26/22 118 lb (53.5 kg)    BP (!) 128/52   Pulse 84   Ht _0  (1.549 m)   Wt 116 lb (52.6 kg)   SpO2 92%   BMI 21.92 kg/m   Assessment and Plan: 1. Iron deficiency anemia, unspecified iron deficiency anemia type Will recheck labs and if no improvement, will refer to Hematology She appears to be tolerating the anemia well. - CBC with Differential/Platelet - Iron, TIBC and Ferritin Panel - Fecal occult  blood, imunochemical  2. B12 deficiency Continue oral B12 - may need IM injections - Vitamin B12  3. Acquired thrombophilia ([place]) Due to Warfarin therapy Despite recent supra therapeutic INR, she has not seen any bleeding   Partially dictated using Editor, commissioning. Any errors are unintentional.  Halina Maidens, MD [facility]  04/01/2022

## 2022-04-02 LAB — CBC WITH DIFFERENTIAL/PLATELET
Basophils Absolute: 0 10*3/uL (ref 0.0–0.2)
Basos: 0 %
EOS (ABSOLUTE): 0.1 10*3/uL (ref 0.0–0.4)
Eos: 1 %
Hematocrit: 36 % (ref 34.0–46.6)
Hemoglobin: 11.7 g/dL (ref 11.1–15.9)
Immature Grans (Abs): 0 10*3/uL (ref 0.0–0.1)
Immature Granulocytes: 0 %
Lymphocytes Absolute: 0.9 10*3/uL (ref 0.7–3.1)
Lymphs: 11 %
MCH: 32 pg (ref 26.6–33.0)
MCHC: 32.5 g/dL (ref 31.5–35.7)
MCV: 98 fL — ABNORMAL HIGH (ref 79–97)
Monocytes Absolute: 0.8 10*3/uL (ref 0.1–0.9)
Monocytes: 10 %
Neutrophils Absolute: 6.4 10*3/uL (ref 1.4–7.0)
Neutrophils: 78 %
Platelets: 169 10*3/uL (ref 150–450)
RBC: 3.66 x10E6/uL — ABNORMAL LOW (ref 3.77–5.28)
RDW: 14.8 % (ref 11.7–15.4)
WBC: 8.2 10*3/uL (ref 3.4–10.8)

## 2022-04-02 LAB — IRON,TIBC AND FERRITIN PANEL
Ferritin: 77 ng/mL (ref 15–150)
Iron Saturation: 41 % (ref 15–55)
Iron: 113 ug/dL (ref 27–139)
Total Iron Binding Capacity: 278 ug/dL (ref 250–450)
UIBC: 165 ug/dL (ref 118–369)

## 2022-04-02 LAB — VITAMIN B12: Vitamin B-12: >2000 pg/mL — ABNORMAL HIGH (ref 232–1245)

## 2022-04-04 LAB — FECAL OCCULT BLOOD, IMMUNOCHEMICAL: Fecal Occult Bld: POSITIVE — AB

## 2022-04-04 LAB — SPECIMEN STATUS REPORT

## 2022-04-05 ENCOUNTER — Other Ambulatory Visit: Payer: Self-pay

## 2022-04-05 DIAGNOSIS — R195 Other fecal abnormalities: Secondary | ICD-10-CM

## 2022-04-05 DIAGNOSIS — D509 Iron deficiency anemia, unspecified: Secondary | ICD-10-CM

## 2022-04-08 DIAGNOSIS — J9611 Chronic respiratory failure with hypoxia: Secondary | ICD-10-CM | POA: Insufficient documentation

## 2022-04-08 NOTE — Assessment & Plan Note (Addendum)
-   Continues to benefit from 2L supplemental oxygen 24/7. Goal maintain O2 > 88 to 90%.

## 2022-04-08 NOTE — Assessment & Plan Note (Signed)
-   Stable; Breathing is baseline. No recent exacerbations or hospitalizations. Continue ipratropium-albuterol nebulizer q 6 hours as needed for breakthrough sob/wheezing. Advised patient to notify office if she develop worsening respiratory symptoms or purulent mucus. FU in 12 months with Dr. Mortimer Fries or sooner if needed.

## 2022-04-17 ENCOUNTER — Ambulatory Visit (INDEPENDENT_AMBULATORY_CARE_PROVIDER_SITE_OTHER): Payer: Medicare Other

## 2022-04-17 DIAGNOSIS — I340 Nonrheumatic mitral (valve) insufficiency: Secondary | ICD-10-CM

## 2022-04-17 DIAGNOSIS — Z5181 Encounter for therapeutic drug level monitoring: Secondary | ICD-10-CM | POA: Diagnosis not present

## 2022-04-17 DIAGNOSIS — Z953 Presence of xenogenic heart valve: Secondary | ICD-10-CM | POA: Diagnosis not present

## 2022-04-17 LAB — POCT INR: INR: 3.4 — AB (ref 2.0–3.0)

## 2022-04-17 NOTE — Patient Instructions (Signed)
-   HOLD TODAY and then DECREASE TO 1 tablet every day, EXCEPT 0.5 TABLET ON MONDAY AND WEDNESDAY.  - Recheck INR in 3 weeks.

## 2022-05-04 ENCOUNTER — Other Ambulatory Visit: Payer: Self-pay | Admitting: Cardiovascular Disease

## 2022-05-08 ENCOUNTER — Ambulatory Visit: Payer: Medicare Other | Attending: Cardiovascular Disease

## 2022-05-08 DIAGNOSIS — I340 Nonrheumatic mitral (valve) insufficiency: Secondary | ICD-10-CM | POA: Diagnosis not present

## 2022-05-08 DIAGNOSIS — Z953 Presence of xenogenic heart valve: Secondary | ICD-10-CM

## 2022-05-08 DIAGNOSIS — Z5181 Encounter for therapeutic drug level monitoring: Secondary | ICD-10-CM | POA: Diagnosis not present

## 2022-05-08 LAB — POCT INR: INR: 3.4 — AB (ref 2.0–3.0)

## 2022-05-08 NOTE — Patient Instructions (Signed)
-   HOLD TODAY and then continue 1 tablet every day, EXCEPT 0.5 TABLET ON MONDAY AND WEDNESDAY.  - Recheck INR in 3 weeks.

## 2022-05-25 ENCOUNTER — Other Ambulatory Visit: Payer: Self-pay | Admitting: Cardiovascular Disease

## 2022-05-29 ENCOUNTER — Ambulatory Visit: Payer: Medicare Other

## 2022-06-05 ENCOUNTER — Ambulatory Visit: Payer: Medicare Other | Attending: Cardiovascular Disease

## 2022-06-05 DIAGNOSIS — I340 Nonrheumatic mitral (valve) insufficiency: Secondary | ICD-10-CM | POA: Insufficient documentation

## 2022-06-05 DIAGNOSIS — Z953 Presence of xenogenic heart valve: Secondary | ICD-10-CM | POA: Diagnosis not present

## 2022-06-05 DIAGNOSIS — Z5181 Encounter for therapeutic drug level monitoring: Secondary | ICD-10-CM | POA: Diagnosis not present

## 2022-06-05 LAB — POCT INR: INR: 3.2 — AB (ref 2.0–3.0)

## 2022-06-05 NOTE — Patient Instructions (Signed)
Decrease to 1 tablet every day, EXCEPT 0.5 TABLET ON MONDAY, WEDNESDAY, and FRIDAY - Recheck INR in 3 weeks.

## 2022-06-06 ENCOUNTER — Ambulatory Visit (INDEPENDENT_AMBULATORY_CARE_PROVIDER_SITE_OTHER): Payer: Medicare Other

## 2022-06-06 DIAGNOSIS — I442 Atrioventricular block, complete: Secondary | ICD-10-CM

## 2022-06-07 LAB — CUP PACEART REMOTE DEVICE CHECK
Battery Remaining Longevity: 30 mo
Battery Voltage: 2.94 V
Brady Statistic AP VP Percent: 94.33 %
Brady Statistic AP VS Percent: 0.11 %
Brady Statistic AS VP Percent: 5.54 %
Brady Statistic AS VS Percent: 0.02 %
Brady Statistic RA Percent Paced: 92.39 %
Brady Statistic RV Percent Paced: 99.84 %
Date Time Interrogation Session: 20230928115739
Implantable Lead Implant Date: 20160823
Implantable Lead Implant Date: 20160823
Implantable Lead Location: 753859
Implantable Lead Location: 753860
Implantable Lead Model: 5076
Implantable Lead Model: 5076
Implantable Pulse Generator Implant Date: 20160823
Lead Channel Impedance Value: 342 Ohm
Lead Channel Impedance Value: 361 Ohm
Lead Channel Impedance Value: 380 Ohm
Lead Channel Impedance Value: 380 Ohm
Lead Channel Pacing Threshold Amplitude: 0.5 V
Lead Channel Pacing Threshold Amplitude: 0.625 V
Lead Channel Pacing Threshold Pulse Width: 0.4 ms
Lead Channel Pacing Threshold Pulse Width: 0.4 ms
Lead Channel Sensing Intrinsic Amplitude: 1.125 mV
Lead Channel Sensing Intrinsic Amplitude: 1.125 mV
Lead Channel Sensing Intrinsic Amplitude: 12.875 mV
Lead Channel Sensing Intrinsic Amplitude: 12.875 mV
Lead Channel Setting Pacing Amplitude: 1.5 V
Lead Channel Setting Pacing Amplitude: 2 V
Lead Channel Setting Pacing Pulse Width: 0.4 ms
Lead Channel Setting Sensing Sensitivity: 2.8 mV

## 2022-06-08 ENCOUNTER — Other Ambulatory Visit: Payer: Self-pay | Admitting: Cardiovascular Disease

## 2022-06-12 NOTE — Progress Notes (Signed)
Remote pacemaker transmission.   

## 2022-06-26 ENCOUNTER — Ambulatory Visit: Payer: Medicare Other | Attending: Cardiovascular Disease

## 2022-06-26 ENCOUNTER — Telehealth: Payer: Self-pay | Admitting: Primary Care

## 2022-06-26 DIAGNOSIS — I340 Nonrheumatic mitral (valve) insufficiency: Secondary | ICD-10-CM | POA: Diagnosis not present

## 2022-06-26 DIAGNOSIS — Z953 Presence of xenogenic heart valve: Secondary | ICD-10-CM | POA: Diagnosis not present

## 2022-06-26 DIAGNOSIS — Z5181 Encounter for therapeutic drug level monitoring: Secondary | ICD-10-CM | POA: Diagnosis not present

## 2022-06-26 LAB — POCT INR: INR: 2.8 (ref 2.0–3.0)

## 2022-06-26 NOTE — Patient Instructions (Signed)
Continue 1 tablet every day, EXCEPT 0.5 TABLET ON MONDAY, WEDNESDAY, and FRIDAY - Recheck INR in 6 weeks. 

## 2022-06-26 NOTE — Telephone Encounter (Signed)
Called and spoke to patient.  She is questioning if she should receive RSV vaccine.   Dr. Mortimer Fries, please advise. Thanks

## 2022-06-27 NOTE — Telephone Encounter (Signed)
Called and spoke with patient, advised of recommendation per Dr. Mortimer Fries regarding the RSV vaccine.  She verbalized understanding.  Nothing further needed.

## 2022-08-07 ENCOUNTER — Ambulatory Visit: Payer: Medicare Other | Attending: Cardiovascular Disease

## 2022-08-07 DIAGNOSIS — Z5181 Encounter for therapeutic drug level monitoring: Secondary | ICD-10-CM

## 2022-08-07 DIAGNOSIS — I340 Nonrheumatic mitral (valve) insufficiency: Secondary | ICD-10-CM | POA: Diagnosis not present

## 2022-08-07 DIAGNOSIS — Z953 Presence of xenogenic heart valve: Secondary | ICD-10-CM | POA: Diagnosis not present

## 2022-08-07 LAB — POCT INR: INR: 1.7 — AB (ref 2.0–3.0)

## 2022-08-07 NOTE — Patient Instructions (Signed)
TAKE 1 TABLET TODAY ONLY then Continue 1 tablet every day, EXCEPT 0.5 TABLET ON MONDAY, WEDNESDAY, and FRIDAY - Recheck INR in 6 weeks.

## 2022-08-09 ENCOUNTER — Other Ambulatory Visit: Payer: Self-pay | Admitting: Internal Medicine

## 2022-08-09 ENCOUNTER — Other Ambulatory Visit: Payer: Self-pay | Admitting: Medical

## 2022-08-09 DIAGNOSIS — M5432 Sciatica, left side: Secondary | ICD-10-CM

## 2022-08-09 DIAGNOSIS — E039 Hypothyroidism, unspecified: Secondary | ICD-10-CM

## 2022-08-09 NOTE — Telephone Encounter (Signed)
Requested Prescriptions  Pending Prescriptions Disp Refills   gabapentin (NEURONTIN) 100 MG capsule [Pharmacy Med Name: GABAPENTIN 100 MG CAPSULE] 180 capsule 1    Sig: TAKE 2 CAPSULES BY MOUTH AT BEDTIME.     Neurology: Anticonvulsants - gabapentin Passed - 08/09/2022 10:07 AM      Passed - Cr in normal range and within 360 days    Creatinine, Ser  Date Value Ref Range Status  02/19/2022 0.96 0.57 - 1.00 mg/dL Final         Passed - Completed PHQ-2 or PHQ-9 in the last 360 days      Passed - Valid encounter within last 12 months    Recent Outpatient Visits           4 months ago Iron deficiency anemia, unspecified iron deficiency anemia type   [place] [facility] at [facility], Jesse Sans, MD   5 months ago Essential hypertension   [place] [facility] at [facility], Jesse Sans, MD   11 months ago Essential hypertension   [place] [facility] at [facility], Jesse Sans, MD   1 year ago Adult hypothyroidism   [place] [facility] at [facility], Jesse Sans, MD       Future Appointments             In 1 week Army Melia, Jesse Sans, MD [facility]   In 3 weeks Wellington Hampshire, MD [place]             levothyroxine (SYNTHROID) 88 MCG tablet [Pharmacy Med Name: LEVOTHYROXINE 88 MCG TABLET] 90 tablet 1    Sig: TAKE 1 TABLET BY MOUTH DAILY BEFORE BREAKFAST.     Endocrinology:  Hypothyroid Agents Passed - 08/09/2022 10:07 AM      Passed - TSH in normal range and within 360 days    TSH  Date Value Ref Range Status  02/19/2022 0.992 0.450 - 4.500 uIU/mL Final         Passed - Valid encounter within last 12 months    Recent Outpatient Visits           4 months ago Iron deficiency anemia, unspecified iron deficiency anemia  type   [place] [facility] at [facility], Jesse Sans, MD   5 months ago Essential hypertension   [place] [facility] at [facility], Jesse Sans, MD   11 months ago Essential hypertension   [place] [facility] at [facility], Jesse Sans, MD   1 year ago Adult hypothyroidism    Primary Care and Sports Medicine at [facility], Jesse Sans, MD       Future Appointments             In 1 week Army Melia, Jesse Sans, MD [facility] Primary Care and Sports Medicine at [facility]   In 3 weeks Wellington Hampshire, MD [place]. [place]

## 2022-08-20 ENCOUNTER — Ambulatory Visit: Payer: Medicare Other | Attending: Internal Medicine | Admitting: Internal Medicine

## 2022-08-20 ENCOUNTER — Encounter: Payer: Self-pay | Admitting: Internal Medicine

## 2022-08-20 VITALS — BP 120/64 | HR 79 | Ht 61.0 in | Wt 114.0 lb

## 2022-08-20 DIAGNOSIS — I428 Other cardiomyopathies: Secondary | ICD-10-CM | POA: Diagnosis not present

## 2022-08-20 DIAGNOSIS — I442 Atrioventricular block, complete: Secondary | ICD-10-CM | POA: Diagnosis not present

## 2022-08-20 DIAGNOSIS — Z950 Presence of cardiac pacemaker: Secondary | ICD-10-CM | POA: Diagnosis not present

## 2022-08-20 DIAGNOSIS — I5022 Chronic systolic (congestive) heart failure: Secondary | ICD-10-CM | POA: Diagnosis present

## 2022-08-20 DIAGNOSIS — Z01812 Encounter for preprocedural laboratory examination: Secondary | ICD-10-CM | POA: Diagnosis present

## 2022-08-20 DIAGNOSIS — Z79899 Other long term (current) drug therapy: Secondary | ICD-10-CM | POA: Insufficient documentation

## 2022-08-20 DIAGNOSIS — I25119 Atherosclerotic heart disease of native coronary artery with unspecified angina pectoris: Secondary | ICD-10-CM | POA: Diagnosis not present

## 2022-08-20 DIAGNOSIS — I4819 Other persistent atrial fibrillation: Secondary | ICD-10-CM | POA: Diagnosis not present

## 2022-08-20 LAB — PACEMAKER DEVICE OBSERVATION

## 2022-08-20 MED ORDER — BISOPROLOL FUMARATE 5 MG PO TABS: 2.5000 mg | ORAL_TABLET | Freq: Every day | ORAL | 3 refills | Status: DC

## 2022-08-20 NOTE — Progress Notes (Signed)
See reply Patient Care Team: Glean Hess, MD as PCP - General (Internal Medicine) Wellington Hampshire, MD as PCP - Cardiology (Cardiology) Deboraha Sprang, MD as PCP - Electrophysiology (Cardiology) Alisa Graff, FNP as Nurse Practitioner (Family Medicine) Flora Lipps, MD as Consulting Physician (Pulmonary Disease) Lonia Farber, MD as Consulting Physician (Internal Medicine)   HPI  [name] is a 87 y.o. 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  Excited that there is been no interval significant atrial arrhythmias.  Attributes her wellness to this.  No edema.  Chronic oxygen.     DATE TEST EF   5/16 Echo   65 %   12/16 Echo   30-35 %   1/18 Echo  55%   11/19 Echo  20-25%   12/19 Echo  35%   6/20 RHC  RVsys 70 PCWP 23 RA  7/20 Echo  35% RV dysfunction mod  6/22 Echo   35-40% RV dysfunction severe PA press >58           Date Cr K Hgb TSH LFTs  8/17     9.2    5/18 0.8  11.5    5/19 0.8 3.9 13.5 (MCV 103<<89)    11/19 0.8 3.9 9.9 2.168 43-  6/20 0.84 4.9 8.0 2.860 (2/20) 51  11/20 0.74 4.2 11.8 2.119   6/21 0.8 3.8 13.1 1.039   11/21 0.82 4.1 12.2 (1/22) 0.824 14  12/22 1.01 3.2  1.39 15  6/23 0.96 3.7 11.7<<9.9 0.99 13     Hx of recurrent anemia and has had Fe replacement, but  not sure if there is a specific diagnosis    Past Medical History:  Diagnosis Date   Acute on chronic respiratory failure with hypoxia ([place]) 02/04/2019   Added automatically from request for surgery [id number]   Acute on chronic systolic heart failure ([place])    Anxiety    Arthritis    "hands" (11/10/2015)   Breast cancer ([place]) 2002   right   CAD s/p CABG x 1    a. 04/2015 [place] to LAD   Chronic combined systolic (congestive) and diastolic (congestive) heart failure ([place])    a. 01/2015 Echo: EF 50-55%, Gr2 DD (preop valve surgery); b. 08/2015 Echo: EF 30-35%, diff HK. Gr2 DD; c. 09/2016 Echo: EF 50-55%; d. 07/2018 Echo: EF 20-25%; e. 03/2019 Echo: EF 35-40%, diff HK. Mod red RV fxn, RVSP 34.29mHg. Mildly dil LA. Nl MV prosthesis. Triv AI.   COPD (chronic obstructive pulmonary disease) (HCC)    Coronary artery disease    GERD (gastroesophageal reflux disease)    History of colon polyps    History of mitral valve replacement    a. 04/2015 s/p 27 mm [facility] Center[facility] bovine bioprosthetic tissue valve   Hyperlipidemia    Hypertension    Hypothyroidism  Maze operation for AF w/ LAA clipping    a. 04/2015 Complete bilateral atrial lesion set using cryothermy and bipolar radiofrequency ablation with clipping of LA appendage   Meniere disease    Meniere's disease    On home oxygen therapy    "2L at night" (11/10/2015)   PAF (paroxysmal atrial fibrillation) ([place])    Personal history of radiation therapy    Pneumonia ~ 2010   PONV (postoperative nausea and vomiting)    Pt felt like eardrums were gonna pop   Post-surgical complete heart block, symptomatic    a. 04/2015 s/p MDT [id number] Advisa DR MRI DC PPM.   Presence of permanent cardiac pacemaker    Pulmonary hypertension ([place])    Respiratory failure ([place]) 02/22/2019   Surgical wound, non healing - chest wall 11/10/2015   Tricuspid Regurgitation s/p Repair    a. 04/2015 s/p 26 mm Edwards mc3 ring annuloplasty   Wound infection 10/13/2015    Superficial sternal wound infection    Past Surgical History:  Procedure Laterality Date   APPENDECTOMY     APPLICATION OF A-CELL OF CHEST/ABDOMEN N/A 11/21/2015   Procedure: APPLICATION OF A-CELL OF CHEST/ABDOMEN;  Surgeon: Loel Lofty Dillingham, DO;  Location: [place];  Service: Plastics;  Laterality: N/A;   APPLICATION OF A-CELL OF CHEST/ABDOMEN Right 12/21/2015   Procedure: APPLICATION OF A-CELL OF RIGHT CHEST;  Surgeon: Loel Lofty Dillingham, DO;  Location: [place];  Service: Plastics;  Laterality: Right;   APPLICATION OF A-CELL OF CHEST/ABDOMEN Right 01/11/2016   Procedure: APPLICATION OF A-CELL OF RIGHT CHEST;  Surgeon: Loel Lofty Dillingham, DO;  Location: [place];  Service: Plastics;  Laterality: Right;   APPLICATION OF A-CELL OF CHEST/ABDOMEN Right 02/12/2016   Procedure: APPLICATION OF A-CELL TO RIGHT CHEST WOUND;  Surgeon: Loel Lofty Dillingham, DO;  Location: [place];  Service: Plastics;  Laterality: Right;   APPLICATION OF WOUND VAC N/A 05/09/2015   Procedure: APPLICATION OF WOUND VAC;  Surgeon: Rexene Alberts, MD;  Location: [place];  Service: Thoracic;  Laterality: N/A;   APPLICATION OF WOUND VAC N/A 11/13/2015   Procedure: APPLICATION OF WOUND VAC;  Surgeon: Rexene Alberts, MD;  Location: [place];  Service: Thoracic;  Laterality: N/A;   APPLICATION OF WOUND VAC N/A 11/21/2015   Procedure: APPLICATION OF WOUND VAC;  Surgeon: Loel Lofty Dillingham, DO;  Location: [place];  Service: Plastics;  Laterality: N/A;   APPLICATION OF WOUND VAC Right 12/21/2015   Procedure: APPLICATION OF WOUND VAC to right chest wall ;  Surgeon: Loel Lofty Dillingham, DO;  Location: [place];  Service: Plastics;  Laterality: Right;   APPLICATION OF WOUND VAC Right 01/11/2016   Procedure: APPLICATION OF WOUND VAC RIGHT CHEST ;  Surgeon: Loel Lofty Dillingham, DO;  Location: [place];  Service: Plastics;  Laterality: Right;   APPLICATION OF WOUND VAC Right 01/24/2016   Procedure: APPLICATION OF WOUND VAC RIGHT CHEST WALL;  Surgeon: Loel Lofty  Dillingham, DO;  Location: [place];  Service: Plastics;  Laterality: Right;   APPLICATION OF WOUND VAC Right 02/12/2016   Procedure: RE-APPLICATION OF WOUND VAC TO RIGHT CHEST WOUND;  Surgeon: Loel Lofty Dillingham, DO;  Location: [place];  Service: Plastics;  Laterality: Right;   BIV UPGRADE N/A 06/07/2019   Procedure: BIV UPGRADE;  Surgeon: Deboraha Sprang, MD;  Location: [place] [facility];  Service: Cardiovascular;  Laterality: N/A;   BREAST BIOPSY Left 11/25/06   neg   BREAST BIOPSY Left 01/20/12   /clip-neg  CARDIAC CATHETERIZATION  11/2013   [facility]   CARDIAC CATHETERIZATION  10/2014   [facility]   CARDIAC VALVE REPLACEMENT     CARDIOVERSION N/A 07/20/2018   Procedure: CARDIOVERSION;  Surgeon: Wellington Hampshire, MD;  Location: [facility];  Service: Cardiovascular;  Laterality: N/A;   CARDIOVERSION N/A 09/18/2020   Procedure: CARDIOVERSION;  Surgeon: Minna Merritts, MD;  Location: [facility];  Service: Cardiovascular;  Laterality: N/A;   CATARACT EXTRACTION W/ INTRAOCULAR LENS  IMPLANT, BILATERAL Bilateral 2014   CLIPPING OF ATRIAL APPENDAGE N/A 04/25/2015   Procedure: CLIPPING OF ATRIAL APPENDAGE;  Surgeon: Rexene Alberts, MD;  Location: [place];  Service: Open Heart Surgery;  Laterality: N/A;   COCHLEAR IMPLANT Left 2005?   COLONOSCOPY WITH PROPOFOL N/A 01/20/2018   Procedure: COLONOSCOPY WITH PROPOFOL;  Surgeon: Toledo, Benay Pike, MD;  Location: [facility];  Service: Gastroenterology;  Laterality: N/A;   CORONARY ANGIOPLASTY     CORONARY ARTERY BYPASS GRAFT N/A 04/25/2015   Procedure: CORONARY ARTERY BYPASS GRAFTING (CABG) x ONE, using left internal mammary artery;  Surgeon: Rexene Alberts, MD;  Location: [place];  Service: Open Heart Surgery;  Laterality: N/A;   DILATION AND CURETTAGE OF UTERUS  "several before hysterectomy"   EP IMPLANTABLE DEVICE N/A 05/02/2015   Procedure: Pacemaker Implant;  Surgeon: Thompson Grayer, MD;  Location: [place] [facility];  Service: Cardiovascular;  Laterality: N/A;    ESOPHAGOGASTRODUODENOSCOPY (EGD) WITH PROPOFOL N/A 01/20/2018   Procedure: ESOPHAGOGASTRODUODENOSCOPY (EGD) WITH PROPOFOL;  Surgeon: Toledo, Benay Pike, MD;  Location: [facility];  Service: Gastroenterology;  Laterality: N/A;   EYE SURGERY     I & D EXTREMITY Right 12/06/2015   Procedure: IRRIGATION AND DEBRIDEMENT RIGHT CHEST WALL WITH ACELL PLACEMENT AND VAC;  Surgeon: Loel Lofty Dillingham, DO;  Location: [place];  Service: Plastics;  Laterality: Right;   INCISION AND DRAINAGE OF WOUND N/A 11/21/2015   Procedure: IRRIGATION AND DEBRIDEMENT WOUND;  Surgeon: Loel Lofty Dillingham, DO;  Location: [place];  Service: Plastics;  Laterality: N/A;   INCISION AND DRAINAGE OF WOUND Right 12/21/2015   Procedure: IRRIGATION AND DEBRIDEMENT right chest wall WOUND;  Surgeon: Loel Lofty Dillingham, DO;  Location: [place];  Service: Plastics;  Laterality: Right;  right chest wall    INCISION AND DRAINAGE OF WOUND Right 01/24/2016   Procedure: IRRIGATION AND DEBRIDEMENT RIGHT CHEST WALL WOUND;  Surgeon: Loel Lofty Dillingham, DO;  Location: [place];  Service: Plastics;  Laterality: Right;   INCISION AND DRAINAGE OF WOUND Right 03/28/2016   Procedure: IRRIGATION AND DEBRIDEMENT RIGHT CHEST WALL WOUND WITH A Cell Placement;  Surgeon: Loel Lofty Dillingham, DO;  Location: [place];  Service: Plastics;  Laterality: Right;   INCISION AND DRAINAGE OF WOUND Right 05/17/2016   Procedure: IRRIGATION AND DEBRIDEMENT OF RIGHT CHEST WOUND WITH A CELL PLACEMENT;  Surgeon: Loel Lofty Dillingham, DO;  Location: [place];  Service: Plastics;  Laterality: Right;   INSERT / REPLACE / REMOVE PACEMAKER     IRRIGATION AND DEBRIDEMENT OF WOUND WITH SPLIT THICKNESS SKIN GRAFT Right 01/11/2016   Procedure: IRRIGATION AND DEBRIDEMENT OF RIGHT CHEST WOUND ;  Surgeon: Loel Lofty Dillingham, DO;  Location: [place];  Service: Plastics;  Laterality: Right;   MASTECTOMY, PARTIAL Right 2002   positive, partial   MAZE N/A 04/25/2015   Procedure: MAZE;  Surgeon: Rexene Alberts,  MD;  Location: [place];  Service: Open Heart Surgery;  Laterality: N/A;   MITRAL VALVE REPAIR N/A 04/25/2015   Procedure:  MITRAL VALVE  REPLACEMENT using a 27 mm Edwards Perimount Magna Mitral Ease Valve;  Surgeon: Rexene Alberts, MD;  Location: [place];  Service: Open Heart Surgery;  Laterality: N/A;   RIGHT HEART CATH N/A 02/08/2019   Procedure: RIGHT HEART CATH;  Surgeon: Wellington Hampshire, MD;  Location: [place] [facility];  Service: Cardiovascular;  Laterality: N/A;   SKIN SPLIT GRAFT Right 02/12/2016   Procedure: IRRIGATION AND DEBRIDEMENT RIGHT CHEST WOUND;  Surgeon: Loel Lofty Dillingham, DO;  Location: [place];  Service: Plastics;  Laterality: Right;   STERNAL WIRES REMOVAL N/A 06/05/2015   Procedure: STERNAL WIRES REMOVAL;  Surgeon: Rexene Alberts, MD;  Location: [place];  Service: Thoracic;  Laterality: N/A;   STERNAL WOUND DEBRIDEMENT N/A 05/09/2015   Procedure: STERNAL WOUND DEBRIDEMENT;  Surgeon: Rexene Alberts, MD;  Location: [place];  Service: Thoracic;  Laterality: N/A;   STERNAL WOUND DEBRIDEMENT N/A 11/13/2015   Procedure: Excisional drainage of RIGHT Chest wall mass and breast mass ;  Surgeon: Rexene Alberts, MD;  Location: [place];  Service: Thoracic;  Laterality: N/A;   TEE WITHOUT CARDIOVERSION N/A 04/25/2015   Procedure: TRANSESOPHAGEAL ECHOCARDIOGRAM (TEE);  Surgeon: Rexene Alberts, MD;  Location: [place];  Service: Open Heart Surgery;  Laterality: N/A;   TONSILLECTOMY     TRAM Right 11/18/2015   Procedure: VRAM Vertical Rectus Abdominus Muscle Flap;  Surgeon: Loel Lofty Dillingham, DO;  Location: [place];  Service: Plastics;  Laterality: Right;  RIght Back   TRICUSPID VALVE REPLACEMENT N/A 04/25/2015   Procedure: TRICUSPID VALVE REPAIR;  Surgeon: Rexene Alberts, MD;  Location: [place];  Service: Open Heart Surgery;  Laterality: N/A;   VAGINAL HYSTERECTOMY      Current Outpatient Medications  Medication Sig Dispense Refill   acetaminophen (TYLENOL) 500 MG tablet Take 1,000 mg by mouth every 6  (six) hours as needed (for pain.).     amiodarone (PACERONE) 200 MG tablet TAKE 1 TABLET BY MOUTH EVERY DAY 90 tablet 3   bisoprolol (ZEBETA) 5 MG tablet Take 0.5 tablets (2.5 mg total) by mouth daily. 45 tablet 1   Cyanocobalamin (VITAMIN B-12 PO) Take 1,000 mcg by mouth daily.     diphenhydrAMINE (BENADRYL) 25 mg capsule Take 25 mg by mouth every 6 (six) hours as needed for allergies.     docusate sodium (COLACE) 100 MG capsule Take 100 mg by mouth 2 (two) times daily as needed (constipation.).     Ferrous Sulfate (IRON PO) Take 1 tablet by mouth daily.     furosemide (LASIX) 20 MG tablet TAKE 2 TABLETS (40 MG) IN THE MORNING AND 1 TABLET (20) MG IN THE EVENING. 270 tablet 1   gabapentin (NEURONTIN) 100 MG capsule TAKE 2 CAPSULES BY MOUTH AT BEDTIME. 180 capsule 1   ipratropium-albuterol (DUONEB) 0.5-2.5 (3) MG/3ML SOLN Inhale 3 mLs into the lungs every 6 (six) hours as needed. 120 mL 11   levothyroxine (SYNTHROID) 88 MCG tablet TAKE 1 TABLET BY MOUTH DAILY BEFORE BREAKFAST. 90 tablet 1   Multiple Vitamins-Minerals (PRESERVISION AREDS 2 PO) Take 1 tablet by mouth 2 (two) times daily.      OXYGEN Inhale 2 L into the lungs continuous.     pantoprazole (PROTONIX) 40 MG tablet Take 1 tablet (40 mg total) by mouth 2 (two) times daily. 180 tablet 1   sacubitril-valsartan (ENTRESTO) 49-51 MG Take 1 tablet (49/51 mg) by mouth twice daily 180 tablet 3   simvastatin (ZOCOR) 10 MG  tablet TAKE 1 TABLET BY MOUTH EVERYDAY AT BEDTIME 90 tablet 2   spironolactone (ALDACTONE) 25 MG tablet TAKE 1 TABLET BY MOUTH EVERY DAY 90 tablet 3   warfarin (COUMADIN) 2.5 MG tablet TAKE AS DIRECTED PER COUMADIN CLINIC 100 tablet 1   No current facility-administered medications for this visit.    Allergies  Allergen Reactions   Ace Inhibitors Cough and Other (See Comments)   Amoxicillin-Pot Clavulanate Swelling and Other (See Comments)    SWOLLEN JOINTS  Has patient had a PCN reaction causing immediate rash,  facial/tongue/throat swelling, SOB or lightheadedness with hypotension: Yes Has patient had a PCN reaction causing severe rash involving mucus membranes or skin necrosis: No Has patient had a PCN reaction that required hospitalization No Has patient had a PCN reaction occurring within the last 10 years: No If all of the above answers are "NO", then may proceed with Cephalosporin   Penicillins Swelling and Other (See Comments)    SWOLLEN JOINTS  Has patient had a PCN reaction causing immediate rash, facial/tongue/throat swelling, SOB or lightheadedness with hypotension: Yes Has patient had a PCN reaction causing severe rash involving mucus membranes or skin necrosis: No Has patient had a PCN reaction that required hospitalization No Has patient had a PCN reaction occurring within the last 10 years: No If all of the above answers are "NO", then may proceed with Cephalosporin use.  Swollen joints   Rosuvastatin Other (See Comments)   Nifedipine Other (See Comments)    UNSPECIFIED UNSPECIFIED    Propranolol Other (See Comments)    UNSPECIFIED UNSPECIFIED    Diazepam Other (See Comments)    Made her "hyper"    Meperidine Other (See Comments)    Made her "hyper"     Propoxyphene Other (See Comments)    Made her "hyper"        Review of Systems negative except from HPI and PMH  Physical Exam BP 120/64 (BP Location: Left Arm, Patient Position: Sitting, Cuff Size: Normal)   Pulse 79   Ht '5\' 1"'$  (1.549 m)   Wt 114 lb (51.7 kg)   SpO2 94% Comment: on 2L O2  BMI 21.54 kg/m   Well developed and well nourished in no acute distress HENT normal Neck supple with JVP-flat Clear but very decreased Device pocket well healed; without hematoma or erythema.  There is no tethering  Regular rate and rhythm, no  gallop No  murmur Abd-soft with active BS No Clubbing cyanosis  edema Skin-warm and dry A & Oriented  Grossly normal sensory and motor function  ECG AV pacing  79 18/20/52  Device function is  normal.  Programming changes    See Paceart for details   Assessment and  Plan  Atrial fibrillation-persistent status post maze operation  Recurrent  High Risk Medication Surveillance Amiodarone   Complete heart block -Intermittent  Sinus node dysfunction   First-degree AV block profound--- right bundle branch block  Status post mitral valve replacement-bioprosthetic with tricuspid valve repair  Pacemaker-Medtronic     Oxygen dependent lung disease    Cardiomyopathy   Tremor and gait instability  Anemia     One interval episode of atrial tach, pace terminated by the device continue amiodarone and will recheck surveillance labs  Will recheck Hgb, I dont understand with her COPD that she is not polycythemic  With cardiomyopathy conitnue entresto and bisoprolol

## 2022-08-20 NOTE — Patient Instructions (Addendum)
Medication Instructions:  Your physician recommends that you continue on your current medications as directed. Please refer to the Current Medication list given to you today.  *If you need a refill on your cardiac medications before your next appointment, please call your pharmacy*   Lab Work: - Your physician recommends that you have lab work today:  CMET/ CBC/ [place] at [facility] 1st desk on the right to check in (REGISTRATION)  Lab hours: Monday- Friday (7:30 am- 5:30 pm)   If you have labs (blood work) drawn today and your tests are completely normal, you will receive your results only by: MyChart Message (if you have MyChart) OR A paper copy in the mail If you have any lab test that is abnormal or we need to change your treatment, we will call you to review the results.   Testing/Procedures: None needed  Follow-Up: At [facility], you and your health needs are our priority.  As part of our continuing mission to provide you with exceptional heart care, we have created designated Provider Care Teams.  These Care Teams include your primary Cardiologist (physician) and Advanced Practice Providers (APPs -  Physician Assistants and Nurse Practitioners) who all work together to provide you with the care you need, when you need it.  We recommend signing up for the patient portal called "MyChart".  Sign up information is provided on this After Visit Summary.  MyChart is used to connect with patients for Virtual Visits (Telemedicine).  Patients are able to view lab/test results, encounter notes, upcoming appointments, etc.  Non-urgent messages can be sent to your provider as well.   To learn more about what you can do with MyChart, go to [email or web address].    Your next appointment:   6 month(s)  The format for your next appointment:   In Person  Provider:   You may see Virl Axe MD or one of the following Advanced Practice Providers on your  designated Care Team:   Murray Hodgkins, NP Christell Faith, PA-C Cadence Kathlen Mody, PA-C Gerrie Nordmann, NP   Important Information About Sugar

## 2022-08-21 ENCOUNTER — Encounter: Payer: Self-pay | Admitting: Internal Medicine

## 2022-08-21 ENCOUNTER — Ambulatory Visit (INDEPENDENT_AMBULATORY_CARE_PROVIDER_SITE_OTHER): Payer: Medicare Other | Admitting: Internal Medicine

## 2022-08-21 VITALS — BP 120/68 | HR 79 | Ht 61.0 in | Wt 113.0 lb

## 2022-08-21 DIAGNOSIS — R7303 Prediabetes: Secondary | ICD-10-CM | POA: Diagnosis not present

## 2022-08-21 DIAGNOSIS — I25119 Atherosclerotic heart disease of native coronary artery with unspecified angina pectoris: Secondary | ICD-10-CM

## 2022-08-21 DIAGNOSIS — I10 Essential (primary) hypertension: Secondary | ICD-10-CM

## 2022-08-21 DIAGNOSIS — K219 Gastro-esophageal reflux disease without esophagitis: Secondary | ICD-10-CM

## 2022-08-21 DIAGNOSIS — R195 Other fecal abnormalities: Secondary | ICD-10-CM

## 2022-08-21 DIAGNOSIS — J449 Chronic obstructive pulmonary disease, unspecified: Secondary | ICD-10-CM

## 2022-08-21 LAB — POCT GLYCOSYLATED HEMOGLOBIN (HGB A1C): Hemoglobin A1C: 5.4 % (ref 4.0–5.6)

## 2022-08-21 MED ORDER — PANTOPRAZOLE SODIUM 40 MG PO TBEC: 40.0000 mg | DELAYED_RELEASE_TABLET | Freq: Two times a day (BID) | ORAL | 1 refills | Status: DC

## 2022-08-21 NOTE — Progress Notes (Signed)
Date:  08/21/2022   Name:  [name]   DOB:  1935/07/07   MRN:  [id number]   Chief Complaint: Diabetes and Hypertension  Diabetes She presents for her follow-up diabetic visit. Diabetes type: predi. Her disease course has been stable. Pertinent negatives for hypoglycemia include no dizziness or headaches. Pertinent negatives for diabetes include no chest pain, no fatigue and no weakness.  Anemia Presents for follow-up visit. There has been no abdominal pain, light-headedness or palpitations. (heme positive stool - referred to GI)   Hypertension This is a chronic problem. The problem is controlled. Pertinent negatives include no chest pain, headaches, palpitations or shortness of breath. Hypertensive end-organ damage includes CAD/MI. afib.  COPD - stable on oxygen therapy.  Feels well without complaints.  Lab Results  Component Value Date   NA 145 (H) 02/19/2022   K 3.7 02/19/2022   CO2 31 (H) 02/19/2022   GLUCOSE 115 (H) 02/19/2022   BUN 17 02/19/2022   CREATININE 0.96 02/19/2022   CALCIUM 9.1 02/19/2022   EGFR 58 (L) 02/19/2022   GFRNONAA 55 (L) 03/09/2021   Lab Results  Component Value Date   CHOL 153 08/21/2021   HDL 46 08/21/2021   LDLCALC 92 08/21/2021   TRIG 80 08/21/2021   CHOLHDL 3.3 08/21/2021   Lab Results  Component Value Date   TSH 0.992 02/19/2022   Lab Results  Component Value Date   HGBA1C 5.7 (H) 02/19/2022   Lab Results  Component Value Date   WBC 8.2 04/01/2022   HGB 11.7 04/01/2022   HCT 36.0 04/01/2022   MCV 98 (H) 04/01/2022   PLT 169 04/01/2022   Lab Results  Component Value Date   ALT 13 02/19/2022   AST 13 02/19/2022   ALKPHOS 69 02/19/2022   BILITOT 0.4 02/19/2022   No results found for: "25OHVITD2", "25OHVITD3", "VD25OH"   Review of Systems  Constitutional:  Negative for fatigue and unexpected weight change.  HENT:  Negative for nosebleeds.   Eyes:  Negative for visual disturbance.  Respiratory:  Negative for cough,  chest tightness, shortness of breath and wheezing.   Cardiovascular:  Negative for chest pain, palpitations and leg swelling.  Gastrointestinal:  Negative for abdominal pain, constipation and diarrhea.  Neurological:  Negative for dizziness, weakness, light-headedness and headaches.    Patient Active Problem List   Diagnosis Date Noted   Chronic respiratory failure with hypoxia ([place]) 04/08/2022   Gastroesophageal reflux disease 02/19/2022   Sciatica of left side 08/21/2021   Aortoiliac occlusive disease ([place]) 05/22/2021   Cochlear implant in place 05/22/2021   Acquired thrombophilia ([place]) 08/11/2020   Stage 3a chronic kidney disease (CKD) ([place]) 08/11/2020   Primary insomnia 07/31/2019   Osteoporosis, postmenopausal 01/23/2018   Coronary artery disease involving native coronary artery of native heart with angina pectoris ([place]) 01/13/2017   S/P placement of cardiac pacemaker 05/02/15, medtronic 05/03/2015   Bradycardia 05/03/2015   Presence of cardiac pacemaker 05/03/2015   S/P mitral valve replacement with bioprosthetic valve, tricuspid valve repair, maze procedure and CABG x1 04/25/2015   S/P tricuspid valve repair 04/25/2015   S/P Maze operation for atrial fibrillation 04/25/2015   S/P CABG x 1 04/25/2015   Pulmonary hypertension (HCC)    Tricuspid regurgitation    Chronic combined systolic and diastolic CHF (congestive heart failure) ([place])    Chronic systolic heart failure ([place]) 11/10/2014   Essential hypertension 11/10/2014   Mitral regurgitation 11/10/2014   Hyperlipidemia 11/10/2014   Atrial fibrillation ([place])  11/10/2014   Prediabetes 05/08/2014   Auditory vertigo 05/08/2014   Adult hypothyroidism 05/08/2014   H/O adenomatous polyp of colon 05/08/2014   Stage 4 very severe COPD by GOLD classification ([place]) 03/28/2014    Allergies  Allergen Reactions   Ace Inhibitors Cough and Other (See Comments)   Amoxicillin-Pot Clavulanate Swelling and Other (See Comments)     SWOLLEN JOINTS  Has patient had a PCN reaction causing immediate rash, facial/tongue/throat swelling, SOB or lightheadedness with hypotension: Yes Has patient had a PCN reaction causing severe rash involving mucus membranes or skin necrosis: No Has patient had a PCN reaction that required hospitalization No Has patient had a PCN reaction occurring within the last 10 years: No If all of the above answers are "NO", then may proceed with Cephalosporin   Penicillins Swelling and Other (See Comments)    SWOLLEN JOINTS  Has patient had a PCN reaction causing immediate rash, facial/tongue/throat swelling, SOB or lightheadedness with hypotension: Yes Has patient had a PCN reaction causing severe rash involving mucus membranes or skin necrosis: No Has patient had a PCN reaction that required hospitalization No Has patient had a PCN reaction occurring within the last 10 years: No If all of the above answers are "NO", then may proceed with Cephalosporin use.  Swollen joints   Rosuvastatin Other (See Comments)   Nifedipine Other (See Comments)    UNSPECIFIED UNSPECIFIED    Propranolol Other (See Comments)    UNSPECIFIED UNSPECIFIED    Diazepam Other (See Comments)    Made her "hyper"    Meperidine Other (See Comments)    Made her "hyper"     Propoxyphene Other (See Comments)    Made her "hyper"      Past Surgical History:  Procedure Laterality Date   APPENDECTOMY     APPLICATION OF A-CELL OF CHEST/ABDOMEN N/A 11/21/2015   Procedure: APPLICATION OF A-CELL OF CHEST/ABDOMEN;  Surgeon: Loel Lofty Dillingham, DO;  Location: [place];  Service: Plastics;  Laterality: N/A;   APPLICATION OF A-CELL OF CHEST/ABDOMEN Right 12/21/2015   Procedure: APPLICATION OF A-CELL OF RIGHT CHEST;  Surgeon: Loel Lofty Dillingham, DO;  Location: [place];  Service: Plastics;  Laterality: Right;   APPLICATION OF A-CELL OF CHEST/ABDOMEN Right 01/11/2016   Procedure: APPLICATION OF A-CELL OF RIGHT CHEST;  Surgeon: Loel Lofty  Dillingham, DO;  Location: [place];  Service: Plastics;  Laterality: Right;   APPLICATION OF A-CELL OF CHEST/ABDOMEN Right 02/12/2016   Procedure: APPLICATION OF A-CELL TO RIGHT CHEST WOUND;  Surgeon: Loel Lofty Dillingham, DO;  Location: [place];  Service: Plastics;  Laterality: Right;   APPLICATION OF WOUND VAC N/A 05/09/2015   Procedure: APPLICATION OF WOUND VAC;  Surgeon: Rexene Alberts, MD;  Location: [place];  Service: Thoracic;  Laterality: N/A;   APPLICATION OF WOUND VAC N/A 11/13/2015   Procedure: APPLICATION OF WOUND VAC;  Surgeon: Rexene Alberts, MD;  Location: [place];  Service: Thoracic;  Laterality: N/A;   APPLICATION OF WOUND VAC N/A 11/21/2015   Procedure: APPLICATION OF WOUND VAC;  Surgeon: Loel Lofty Dillingham, DO;  Location: [place];  Service: Plastics;  Laterality: N/A;   APPLICATION OF WOUND VAC Right 12/21/2015   Procedure: APPLICATION OF WOUND VAC to right chest wall ;  Surgeon: Loel Lofty Dillingham, DO;  Location: [place];  Service: Plastics;  Laterality: Right;   APPLICATION OF WOUND VAC Right 01/11/2016   Procedure: APPLICATION OF WOUND VAC RIGHT CHEST ;  Surgeon: Loel Lofty Dillingham, DO;  Location: [place];  Service: Plastics;  Laterality: Right;   APPLICATION OF WOUND VAC Right 01/24/2016   Procedure: APPLICATION OF WOUND VAC RIGHT CHEST WALL;  Surgeon: Loel Lofty Dillingham, DO;  Location: [place];  Service: Plastics;  Laterality: Right;   APPLICATION OF WOUND VAC Right 02/12/2016   Procedure: RE-APPLICATION OF WOUND VAC TO RIGHT CHEST WOUND;  Surgeon: Loel Lofty Dillingham, DO;  Location: [place];  Service: Plastics;  Laterality: Right;   BIV UPGRADE N/A 06/07/2019   Procedure: BIV UPGRADE;  Surgeon: Deboraha Sprang, MD;  Location: [place] [facility];  Service: Cardiovascular;  Laterality: N/A;   BREAST BIOPSY Left 11/25/06   neg   BREAST BIOPSY Left 01/20/12   /clip-neg   CARDIAC CATHETERIZATION  11/2013   [facility]   CARDIAC CATHETERIZATION  10/2014   [facility]   CARDIAC VALVE REPLACEMENT     CARDIOVERSION  N/A 07/20/2018   Procedure: CARDIOVERSION;  Surgeon: Wellington Hampshire, MD;  Location: [facility];  Service: Cardiovascular;  Laterality: N/A;   CARDIOVERSION N/A 09/18/2020   Procedure: CARDIOVERSION;  Surgeon: Minna Merritts, MD;  Location: [facility];  Service: Cardiovascular;  Laterality: N/A;   CATARACT EXTRACTION W/ INTRAOCULAR LENS  IMPLANT, BILATERAL Bilateral 2014   CLIPPING OF ATRIAL APPENDAGE N/A 04/25/2015   Procedure: CLIPPING OF ATRIAL APPENDAGE;  Surgeon: Rexene Alberts, MD;  Location: [place];  Service: Open Heart Surgery;  Laterality: N/A;   COCHLEAR IMPLANT Left 2005?   COLONOSCOPY WITH PROPOFOL N/A 01/20/2018   Procedure: COLONOSCOPY WITH PROPOFOL;  Surgeon: Toledo, Benay Pike, MD;  Location: [facility];  Service: Gastroenterology;  Laterality: N/A;   CORONARY ANGIOPLASTY     CORONARY ARTERY BYPASS GRAFT N/A 04/25/2015   Procedure: CORONARY ARTERY BYPASS GRAFTING (CABG) x ONE, using left internal mammary artery;  Surgeon: Rexene Alberts, MD;  Location: [place];  Service: Open Heart Surgery;  Laterality: N/A;   DILATION AND CURETTAGE OF UTERUS  "several before hysterectomy"   EP IMPLANTABLE DEVICE N/A 05/02/2015   Procedure: Pacemaker Implant;  Surgeon: Thompson Grayer, MD;  Location: [place] [facility];  Service: Cardiovascular;  Laterality: N/A;   ESOPHAGOGASTRODUODENOSCOPY (EGD) WITH PROPOFOL N/A 01/20/2018   Procedure: ESOPHAGOGASTRODUODENOSCOPY (EGD) WITH PROPOFOL;  Surgeon: Toledo, Benay Pike, MD;  Location: [facility];  Service: Gastroenterology;  Laterality: N/A;   EYE SURGERY     I & D EXTREMITY Right 12/06/2015   Procedure: IRRIGATION AND DEBRIDEMENT RIGHT CHEST WALL WITH ACELL PLACEMENT AND VAC;  Surgeon: Loel Lofty Dillingham, DO;  Location: [place];  Service: Plastics;  Laterality: Right;   INCISION AND DRAINAGE OF WOUND N/A 11/21/2015   Procedure: IRRIGATION AND DEBRIDEMENT WOUND;  Surgeon: Loel Lofty Dillingham, DO;  Location: [place];  Service: Plastics;  Laterality: N/A;    INCISION AND DRAINAGE OF WOUND Right 12/21/2015   Procedure: IRRIGATION AND DEBRIDEMENT right chest wall WOUND;  Surgeon: Loel Lofty Dillingham, DO;  Location: [place];  Service: Plastics;  Laterality: Right;  right chest wall    INCISION AND DRAINAGE OF WOUND Right 01/24/2016   Procedure: IRRIGATION AND DEBRIDEMENT RIGHT CHEST WALL WOUND;  Surgeon: Loel Lofty Dillingham, DO;  Location: [place];  Service: Plastics;  Laterality: Right;   INCISION AND DRAINAGE OF WOUND Right 03/28/2016   Procedure: IRRIGATION AND DEBRIDEMENT RIGHT CHEST WALL WOUND WITH A Cell Placement;  Surgeon: Loel Lofty Dillingham, DO;  Location: [place];  Service: Plastics;  Laterality: Right;   INCISION AND DRAINAGE OF WOUND Right 05/17/2016  Procedure: IRRIGATION AND DEBRIDEMENT OF RIGHT CHEST WOUND WITH A CELL PLACEMENT;  Surgeon: Loel Lofty Dillingham, DO;  Location: [place];  Service: Plastics;  Laterality: Right;   INSERT / REPLACE / REMOVE PACEMAKER     IRRIGATION AND DEBRIDEMENT OF WOUND WITH SPLIT THICKNESS SKIN GRAFT Right 01/11/2016   Procedure: IRRIGATION AND DEBRIDEMENT OF RIGHT CHEST WOUND ;  Surgeon: Loel Lofty Dillingham, DO;  Location: [place];  Service: Plastics;  Laterality: Right;   MASTECTOMY, PARTIAL Right 2002   positive, partial   MAZE N/A 04/25/2015   Procedure: MAZE;  Surgeon: Rexene Alberts, MD;  Location: [place];  Service: Open Heart Surgery;  Laterality: N/A;   MITRAL VALVE REPAIR N/A 04/25/2015   Procedure: MITRAL VALVE  REPLACEMENT using a 27 mm Edwards Perimount Magna Mitral Ease Valve;  Surgeon: Rexene Alberts, MD;  Location: [place];  Service: Open Heart Surgery;  Laterality: N/A;   RIGHT HEART CATH N/A 02/08/2019   Procedure: RIGHT HEART CATH;  Surgeon: Wellington Hampshire, MD;  Location: [place] [facility];  Service: Cardiovascular;  Laterality: N/A;   SKIN SPLIT GRAFT Right 02/12/2016   Procedure: IRRIGATION AND DEBRIDEMENT RIGHT CHEST WOUND;  Surgeon: Loel Lofty Dillingham, DO;  Location: [place];  Service: Plastics;   Laterality: Right;   STERNAL WIRES REMOVAL N/A 06/05/2015   Procedure: STERNAL WIRES REMOVAL;  Surgeon: Rexene Alberts, MD;  Location: [place];  Service: Thoracic;  Laterality: N/A;   STERNAL WOUND DEBRIDEMENT N/A 05/09/2015   Procedure: STERNAL WOUND DEBRIDEMENT;  Surgeon: Rexene Alberts, MD;  Location: [place];  Service: Thoracic;  Laterality: N/A;   STERNAL WOUND DEBRIDEMENT N/A 11/13/2015   Procedure: Excisional drainage of RIGHT Chest wall mass and breast mass ;  Surgeon: Rexene Alberts, MD;  Location: [place];  Service: Thoracic;  Laterality: N/A;   TEE WITHOUT CARDIOVERSION N/A 04/25/2015   Procedure: TRANSESOPHAGEAL ECHOCARDIOGRAM (TEE);  Surgeon: Rexene Alberts, MD;  Location: [place];  Service: Open Heart Surgery;  Laterality: N/A;   TONSILLECTOMY     TRAM Right 11/18/2015   Procedure: VRAM Vertical Rectus Abdominus Muscle Flap;  Surgeon: Loel Lofty Dillingham, DO;  Location: [place];  Service: Plastics;  Laterality: Right;  RIght Back   TRICUSPID VALVE REPLACEMENT N/A 04/25/2015   Procedure: TRICUSPID VALVE REPAIR;  Surgeon: Rexene Alberts, MD;  Location: [place];  Service: Open Heart Surgery;  Laterality: N/A;   VAGINAL HYSTERECTOMY      Social History   Tobacco Use   Smoking status: Former    Packs/day: 1.50    Years: 40.00    Total pack years: 60.00    Types: Cigarettes    Quit date: 04/20/1998    Years since quitting: 24.3   Smokeless tobacco: Never  Vaping Use   Vaping Use: Never used  Substance Use Topics   Alcohol use: Yes    Comment: 11/10/2015 "glass of wine a few times/year, if that"   Drug use: No     Medication list has been reviewed and updated.  Current Meds  Medication Sig   acetaminophen (TYLENOL) 500 MG tablet Take 1,000 mg by mouth every 6 (six) hours as needed (for pain.).   amiodarone (PACERONE) 200 MG tablet TAKE 1 TABLET BY MOUTH EVERY DAY   bisoprolol (ZEBETA) 5 MG tablet Take 0.5 tablets (2.5 mg total) by mouth daily.   Cyanocobalamin (VITAMIN B-12 PO) Take  1,000 mcg by mouth daily.   diphenhydrAMINE (BENADRYL) 25 mg capsule  Take 25 mg by mouth every 6 (six) hours as needed for allergies.   docusate sodium (COLACE) 100 MG capsule Take 100 mg by mouth 2 (two) times daily as needed (constipation.).   Ferrous Sulfate (IRON PO) Take 1 tablet by mouth daily.   furosemide (LASIX) 20 MG tablet TAKE 2 TABLETS (40 MG) IN THE MORNING AND 1 TABLET (20) MG IN THE EVENING.   gabapentin (NEURONTIN) 100 MG capsule TAKE 2 CAPSULES BY MOUTH AT BEDTIME.   ipratropium-albuterol (DUONEB) 0.5-2.5 (3) MG/3ML SOLN Inhale 3 mLs into the lungs every 6 (six) hours as needed.   levothyroxine (SYNTHROID) 88 MCG tablet TAKE 1 TABLET BY MOUTH DAILY BEFORE BREAKFAST.   Multiple Vitamins-Minerals (PRESERVISION AREDS 2 PO) Take 1 tablet by mouth 2 (two) times daily.    OXYGEN Inhale 2 L into the lungs continuous.   pantoprazole (PROTONIX) 40 MG tablet Take 1 tablet (40 mg total) by mouth 2 (two) times daily.   sacubitril-valsartan (ENTRESTO) 49-51 MG Take 1 tablet (49/51 mg) by mouth twice daily   simvastatin (ZOCOR) 10 MG tablet TAKE 1 TABLET BY MOUTH EVERYDAY AT BEDTIME   spironolactone (ALDACTONE) 25 MG tablet TAKE 1 TABLET BY MOUTH EVERY DAY   warfarin (COUMADIN) 2.5 MG tablet TAKE AS DIRECTED PER COUMADIN CLINIC       08/21/2022    7:57 AM 04/01/2022    2:18 PM 02/19/2022    8:16 AM 08/21/2021    8:23 AM  GAD 7 : Generalized Anxiety Score  Nervous, Anxious, on Edge 0 0 0 0  Control/stop worrying 0 0 0 0  Worry too much - different things 0 0 0 0  Trouble relaxing 0 0 0 0  Restless 0 0 0 0  Easily annoyed or irritable 0 0 0 0  Afraid - awful might happen 0 0 0 0  Total GAD 7 Score 0 0 0 0  Anxiety Difficulty Not difficult at all Not difficult at all Not difficult at all Not difficult at all       08/21/2022    7:56 AM 04/01/2022    2:18 PM 02/19/2022    8:16 AM  Depression screen PHQ 2/9  Decreased Interest 0 0 0  Down, Depressed, Hopeless 0 0 0  PHQ - 2  Score 0 0 0  Altered sleeping 0 0 0  Tired, decreased energy 1 1   Change in appetite 0 0 1  Feeling bad or failure about yourself  0 0 0  Trouble concentrating 0 0 0  Moving slowly or fidgety/restless 0 0 0  Suicidal thoughts 0 0 0  PHQ-9 Score _0 Difficult doing work/chores Not difficult at all Not difficult at all Not difficult at all    BP Readings from Last 3 Encounters:  08/21/22 120/68  08/20/22 120/64  04/01/22 (!) 128/52    Physical Exam Vitals and nursing note reviewed.  Constitutional:      General: She is not in acute distress.    Appearance: She is well-developed.  HENT:     Head: Normocephalic and atraumatic.  Pulmonary:     Effort: Pulmonary effort is normal. No respiratory distress.  Skin:    General: Skin is warm and dry.     Findings: No rash.  Neurological:     Mental Status: She is alert and oriented to person, place, and time.  Psychiatric:        Mood and Affect: Mood normal.  Behavior: Behavior normal.     Wt Readings from Last 3 Encounters:  08/21/22 113 lb (51.3 kg)  08/20/22 114 lb (51.7 kg)  04/01/22 116 lb (52.6 kg)    BP 120/68   Pulse 79   Ht _0  (1.549 m)   Wt 113 lb (51.3 kg)   SpO2 99%   BMI 21.35 kg/m   Assessment and Plan: 1. Essential hypertension Clinically stable exam with well controlled BP. Tolerating medications without side effects at this time. Pt to continue current regimen and low sodium diet;  - Comprehensive metabolic panel - TSH  2. Prediabetes Improved and now in the normal range - POCT glycosylated hemoglobin (Hb A1C) - Comprehensive metabolic panel  3. Heme positive stool Likely due to Warfarin therapy Pt is not interested in GI evaluation at this time. We will continue monitor her CBC - CBC with Differential/Platelet  4. Stage 4 very severe COPD by GOLD classification ([place]) On oxygen and duo-nebs  5. Gastroesophageal reflux disease, unspecified whether esophagitis  present Symptoms well controlled on daily PPI No red flag signs such as weight loss, n/v, melena - pantoprazole (PROTONIX) 40 MG tablet; Take 1 tablet (40 mg total) by mouth 2 (two) times daily.  Dispense: 180 tablet; Refill: 1   Partially dictated using Editor, commissioning. Any errors are unintentional.  Halina Maidens, MD [facility]  08/21/2022

## 2022-08-22 LAB — CBC WITH DIFFERENTIAL/PLATELET
Basophils Absolute: 0 10*3/uL (ref 0.0–0.2)
Basos: 0 %
EOS (ABSOLUTE): 0 10*3/uL (ref 0.0–0.4)
Eos: 0 %
Hematocrit: 35.3 % (ref 34.0–46.6)
Hemoglobin: 11.8 g/dL (ref 11.1–15.9)
Immature Grans (Abs): 0 10*3/uL (ref 0.0–0.1)
Immature Granulocytes: 0 %
Lymphocytes Absolute: 0.8 10*3/uL (ref 0.7–3.1)
Lymphs: 12 %
MCH: 33.3 pg — ABNORMAL HIGH (ref 26.6–33.0)
MCHC: 33.4 g/dL (ref 31.5–35.7)
MCV: 100 fL — ABNORMAL HIGH (ref 79–97)
Monocytes Absolute: 0.5 10*3/uL (ref 0.1–0.9)
Monocytes: 8 %
Neutrophils Absolute: 5.3 10*3/uL (ref 1.4–7.0)
Neutrophils: 80 %
Platelets: 141 10*3/uL — ABNORMAL LOW (ref 150–450)
RBC: 3.54 x10E6/uL — ABNORMAL LOW (ref 3.77–5.28)
RDW: 12.6 % (ref 11.7–15.4)
WBC: 6.7 10*3/uL (ref 3.4–10.8)

## 2022-08-22 LAB — COMPREHENSIVE METABOLIC PANEL
ALT: 19 IU/L (ref 0–32)
AST: 18 IU/L (ref 0–40)
Albumin/Globulin Ratio: 1.6 (ref 1.2–2.2)
Albumin: 4.2 g/dL (ref 3.7–4.7)
Alkaline Phosphatase: 70 IU/L (ref 44–121)
BUN/Creatinine Ratio: 20 (ref 12–28)
BUN: 21 mg/dL (ref 8–27)
Bilirubin Total: 0.3 mg/dL (ref 0.0–1.2)
CO2: 33 mmol/L — ABNORMAL HIGH (ref 20–29)
Calcium: 9.3 mg/dL (ref 8.7–10.3)
Chloride: 96 mmol/L (ref 96–106)
Creatinine, Ser: 1.07 mg/dL — ABNORMAL HIGH (ref 0.57–1.00)
Globulin, Total: 2.6 g/dL (ref 1.5–4.5)
Glucose: 100 mg/dL — ABNORMAL HIGH (ref 70–99)
Potassium: 3.8 mmol/L (ref 3.5–5.2)
Sodium: 142 mmol/L (ref 134–144)
Total Protein: 6.8 g/dL (ref 6.0–8.5)
eGFR: 51 mL/min/{1.73_m2} — ABNORMAL LOW (ref >59)

## 2022-08-22 LAB — TSH: TSH: 0.958 u[IU]/mL (ref 0.450–4.500)

## 2022-08-23 NOTE — Progress Notes (Signed)
PC to pt, pt saw results on MyChart, LVM to call back with any questions or concerns. CRM created, PEC may give results

## 2022-09-03 ENCOUNTER — Telehealth: Payer: Self-pay | Admitting: Internal Medicine

## 2022-09-03 NOTE — Telephone Encounter (Signed)
Spoke to pt told her that we will continue to monitor her platelet count. Pt verbalized understanding.  KP

## 2022-09-03 NOTE — Telephone Encounter (Signed)
Copied from [place] [id number]. Topic: General - Other >> Sep 03, 2022  9:06 AM Everette C wrote: Reason for CRM: The patient shares that they have received their lab results from 08/21/22  The patient is concerned with their low platelet count and would like to speak with a member of clinical staff about their lab results further  Please contact when available

## 2022-09-04 ENCOUNTER — Ambulatory Visit: Payer: Medicare Other | Admitting: Cardiovascular Disease

## 2022-09-05 ENCOUNTER — Ambulatory Visit (INDEPENDENT_AMBULATORY_CARE_PROVIDER_SITE_OTHER): Payer: Medicare Other

## 2022-09-05 DIAGNOSIS — I442 Atrioventricular block, complete: Secondary | ICD-10-CM

## 2022-09-05 LAB — CUP PACEART REMOTE DEVICE CHECK
Battery Remaining Longevity: 25 mo
Battery Voltage: 2.93 V
Brady Statistic AP VP Percent: 99.24 %
Brady Statistic AP VS Percent: 0.62 %
Brady Statistic AS VP Percent: 0.14 %
Brady Statistic AS VS Percent: 0 %
Brady Statistic RA Percent Paced: 99.82 %
Brady Statistic RV Percent Paced: 99.37 %
Date Time Interrogation Session: 20231228081149
Implantable Lead Connection Status: 753985
Implantable Lead Connection Status: 753985
Implantable Lead Implant Date: 20160823
Implantable Lead Implant Date: 20160823
Implantable Lead Location: 753859
Implantable Lead Location: 753860
Implantable Lead Model: 5076
Implantable Lead Model: 5076
Implantable Pulse Generator Implant Date: 20160823
Lead Channel Impedance Value: 342 Ohm
Lead Channel Impedance Value: 361 Ohm
Lead Channel Impedance Value: 399 Ohm
Lead Channel Impedance Value: 399 Ohm
Lead Channel Pacing Threshold Amplitude: 0.5 V
Lead Channel Pacing Threshold Amplitude: 0.5 V
Lead Channel Pacing Threshold Pulse Width: 0.4 ms
Lead Channel Pacing Threshold Pulse Width: 0.4 ms
Lead Channel Sensing Intrinsic Amplitude: 0.75 mV
Lead Channel Sensing Intrinsic Amplitude: 0.75 mV
Lead Channel Sensing Intrinsic Amplitude: 12.875 mV
Lead Channel Sensing Intrinsic Amplitude: 12.875 mV
Lead Channel Setting Pacing Amplitude: 1.5 V
Lead Channel Setting Pacing Amplitude: 2 V
Lead Channel Setting Pacing Pulse Width: 0.4 ms
Lead Channel Setting Sensing Sensitivity: 2.8 mV
Zone Setting Status: 755011

## 2022-09-18 ENCOUNTER — Ambulatory Visit: Payer: Medicare Other | Attending: Cardiovascular Disease

## 2022-09-18 DIAGNOSIS — Z5181 Encounter for therapeutic drug level monitoring: Secondary | ICD-10-CM | POA: Diagnosis not present

## 2022-09-18 DIAGNOSIS — I340 Nonrheumatic mitral (valve) insufficiency: Secondary | ICD-10-CM | POA: Diagnosis not present

## 2022-09-18 DIAGNOSIS — Z953 Presence of xenogenic heart valve: Secondary | ICD-10-CM

## 2022-09-18 LAB — POCT INR: INR: 2.2 (ref 2.0–3.0)

## 2022-09-18 NOTE — Patient Instructions (Signed)
Continue 1 tablet every day, EXCEPT 0.5 TABLET ON MONDAY, WEDNESDAY, and FRIDAY - Recheck INR in 6 weeks.

## 2022-09-24 ENCOUNTER — Ambulatory Visit: Payer: Medicare Other | Admitting: Internal Medicine

## 2022-09-24 NOTE — Progress Notes (Signed)
Remote pacemaker transmission.   

## 2022-10-01 ENCOUNTER — Encounter: Payer: Self-pay | Admitting: Medical

## 2022-10-01 ENCOUNTER — Ambulatory Visit: Payer: Medicare Other | Attending: Cardiovascular Disease | Admitting: Medical

## 2022-10-01 VITALS — BP 122/66 | HR 73 | Ht 61.0 in | Wt 114.8 lb

## 2022-10-01 DIAGNOSIS — I4819 Other persistent atrial fibrillation: Secondary | ICD-10-CM | POA: Diagnosis present

## 2022-10-01 DIAGNOSIS — Z950 Presence of cardiac pacemaker: Secondary | ICD-10-CM | POA: Insufficient documentation

## 2022-10-01 DIAGNOSIS — I25119 Atherosclerotic heart disease of native coronary artery with unspecified angina pectoris: Secondary | ICD-10-CM | POA: Insufficient documentation

## 2022-10-01 DIAGNOSIS — I428 Other cardiomyopathies: Secondary | ICD-10-CM | POA: Insufficient documentation

## 2022-10-01 DIAGNOSIS — E782 Mixed hyperlipidemia: Secondary | ICD-10-CM | POA: Insufficient documentation

## 2022-10-01 DIAGNOSIS — I5022 Chronic systolic (congestive) heart failure: Secondary | ICD-10-CM | POA: Insufficient documentation

## 2022-10-01 DIAGNOSIS — Z953 Presence of xenogenic heart valve: Secondary | ICD-10-CM | POA: Diagnosis present

## 2022-10-01 DIAGNOSIS — I442 Atrioventricular block, complete: Secondary | ICD-10-CM | POA: Insufficient documentation

## 2022-10-01 DIAGNOSIS — I340 Nonrheumatic mitral (valve) insufficiency: Secondary | ICD-10-CM | POA: Insufficient documentation

## 2022-10-01 NOTE — Patient Instructions (Signed)
Medication Instructions:  Your physician recommends that you continue on your current medications as directed. Please refer to the Current Medication list given to you today.  *If you need a refill on your cardiac medications before your next appointment, please call your pharmacy*   Lab Work: None ordered  If you have labs (blood work) drawn today and your tests are completely normal, you will receive your results only by: [place] (if you have MyChart) OR A paper copy in the mail If you have any lab test that is abnormal or we need to change your treatment, we will call you to review the results.   Testing/Procedures: None ordered   Follow-Up: At [facility], you and your health needs are our priority.  As part of our continuing mission to provide you with exceptional heart care, we have created designated Provider Care Teams.  These Care Teams include your primary Cardiologist (physician) and Advanced Practice Providers (APPs -  Physician Assistants and Nurse Practitioners) who all work together to provide you with the care you need, when you need it.  We recommend signing up for the patient portal called "MyChart".  Sign up information is provided on this After Visit Summary.  MyChart is used to connect with patients for Virtual Visits (Telemedicine).  Patients are able to view lab/test results, encounter notes, upcoming appointments, etc.  Non-urgent messages can be sent to your provider as well.   To learn more about what you can do with MyChart, go to [email or web address].    Your next appointment:   6 month(s)  Provider:   You may see Kathlyn [place], MD or one of the following Advanced Practice Providers on your designated Care Team:

## 2022-10-01 NOTE — Progress Notes (Signed)
Cardiology Office Note:    Date:  10/01/2022   ID:  [name], DOB Jan 08, 1935, MRN [id number]  PCP:  Glean Hess, MD  [facility] Cardiologist:  Kathlyn Sacramento, MD  [facility] Electrophysiologist:  Virl Axe, MD   Referring MD: Glean Hess, MD   Chief Complaint: 6 month follow-up  History of Present Illness:    [name] is a 88 y.o. 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    She underwent mitral valve replacement with a bioprosthetic valve, tricuspid valve repair, one-vessel CABG with LIMA to LAD and maze procedure in August 2016. This was complicated by sternal wound infection which required debridement. She also had complete heart block and underwent permanent pacemaker placement.   In 08/2017, she had worsening heart failure.  Echocardiogram showed an EF of 30 to 35%. She was seen by EP and it was felt that LV dysfunction was in the context of RV apical pacing. That was inactivated and she noted immediate improvement in symptoms. She had worsening SOB in 2019 and echo was ordered, which showed a drop in EF 20-25% in the setting of afib with RVR. She was rate controlled and started on amiodarone and subsequently underwent cardioversion with restoration of NSR. EF improved to 35-40% after that.    Right heart catheterization was done in June 2020 which showed moderately elevated filling pressures, severe pulmonary hypertension and mildly reduced cardiac output.  There was no evidence of left-to-right intracardiac shunting by saturation run.  Prominent V waves were noted on pulmonary capillary wedge pressure tracing suggestive of significant mitral regurgitation.  Symptoms improved  after increasing furosemide.   She underwent attempted upgrade of her pacemaker to a biventricular device but the procedure was not successful due to inability to place an LV lead.   She had recurrent atrial fibrillation in January 2021 that required cardioversion.  She has been in sinus rhythm since then.    Last seen 08/20/22 by Dr. Caryl Comes and had one episode of atrial tachycardia on amiodarone.   Today, the patient reports less frequent afib episdoes since pacemaker was adjusted. She denies chest pain or SOB. She is on baseline O2. She has chronic lower leg edema that is unchanged.    Past Medical History:  Diagnosis Date   Acute on chronic respiratory failure with hypoxia ([place]) 02/04/2019   Added automatically from request for surgery [phone]   Acute on chronic systolic heart failure ([place])    Anxiety    Arthritis    "hands" (11/10/2015)   Breast cancer ([place]) 2002   right   CAD s/p CABG x 1    a. 04/2015 LIMA to LAD   Chronic combined systolic (congestive) and diastolic (congestive) heart failure ([place])    a. 01/2015 Echo: EF 50-55%, Gr2 DD (preop valve surgery); b. 08/2015 Echo: EF 30-35%, diff HK. Gr2 DD; c. 09/2016 Echo: EF 50-55%; d. 07/2018 Echo: EF 20-25%; e. 03/2019 Echo: EF 35-40%, diff HK. Mod red RV fxn, RVSP 34.31mHg. Mildly dil LA. Nl MV prosthesis. Triv AI.   COPD (chronic obstructive pulmonary disease) (HCC)    Coronary artery disease    GERD (gastroesophageal reflux disease)    History of colon polyps  History of mitral valve replacement    a. 04/2015 s/p 27 mm [facility] Mitral bovine bioprosthetic tissue valve   Hyperlipidemia    Hypertension    Hypothyroidism    Maze operation for AF w/ LAA clipping    a. 04/2015 Complete bilateral atrial lesion set using cryothermy and bipolar radiofrequency ablation with clipping of LA appendage   Meniere disease    Meniere's disease    On home oxygen therapy    "2L at night" (11/10/2015)   PAF (paroxysmal atrial fibrillation)  ([place])    Personal history of radiation therapy    Pneumonia ~ 2010   PONV (postoperative nausea and vomiting)    Pt felt like eardrums were gonna pop   Post-surgical complete heart block, symptomatic    a. 04/2015 s/p MDT [id number] Advisa DR MRI DC PPM.   Presence of permanent cardiac pacemaker    Pulmonary hypertension ([place])    Respiratory failure ([place]) 02/22/2019   Surgical wound, non healing - chest wall 11/10/2015   Tricuspid Regurgitation s/p Repair    a. 04/2015 s/p 26 mm Edwards mc3 ring annuloplasty   Wound infection 10/13/2015   Superficial sternal wound infection    Past Surgical History:  Procedure Laterality Date   APPENDECTOMY     APPLICATION OF A-CELL OF CHEST/ABDOMEN N/A 11/21/2015   Procedure: APPLICATION OF A-CELL OF CHEST/ABDOMEN;  Surgeon: Loel Lofty Dillingham, DO;  Location: [place];  Service: Plastics;  Laterality: N/A;   APPLICATION OF A-CELL OF CHEST/ABDOMEN Right 12/21/2015   Procedure: APPLICATION OF A-CELL OF RIGHT CHEST;  Surgeon: Loel Lofty Dillingham, DO;  Location: [place];  Service: Plastics;  Laterality: Right;   APPLICATION OF A-CELL OF CHEST/ABDOMEN Right 01/11/2016   Procedure: APPLICATION OF A-CELL OF RIGHT CHEST;  Surgeon: Loel Lofty Dillingham, DO;  Location: [place];  Service: Plastics;  Laterality: Right;   APPLICATION OF A-CELL OF CHEST/ABDOMEN Right 02/12/2016   Procedure: APPLICATION OF A-CELL TO RIGHT CHEST WOUND;  Surgeon: Loel Lofty Dillingham, DO;  Location: [place];  Service: Plastics;  Laterality: Right;   APPLICATION OF WOUND VAC N/A 05/09/2015   Procedure: APPLICATION OF WOUND VAC;  Surgeon: Rexene Alberts, MD;  Location: [place];  Service: Thoracic;  Laterality: N/A;   APPLICATION OF WOUND VAC N/A 11/13/2015   Procedure: APPLICATION OF WOUND VAC;  Surgeon: Rexene Alberts, MD;  Location: [place];  Service: Thoracic;  Laterality: N/A;   APPLICATION OF WOUND VAC N/A 11/21/2015   Procedure: APPLICATION OF WOUND VAC;  Surgeon: Loel Lofty Dillingham, DO;  Location: [place];   Service: Plastics;  Laterality: N/A;   APPLICATION OF WOUND VAC Right 12/21/2015   Procedure: APPLICATION OF WOUND VAC to right chest wall ;  Surgeon: Loel Lofty Dillingham, DO;  Location: [place];  Service: Plastics;  Laterality: Right;   APPLICATION OF WOUND VAC Right 01/11/2016   Procedure: APPLICATION OF WOUND VAC RIGHT CHEST ;  Surgeon: Loel Lofty Dillingham, DO;  Location: [place];  Service: Plastics;  Laterality: Right;   APPLICATION OF WOUND VAC Right 01/24/2016   Procedure: APPLICATION OF WOUND VAC RIGHT CHEST WALL;  Surgeon: Loel Lofty Dillingham, DO;  Location: [place];  Service: Plastics;  Laterality: Right;   APPLICATION OF WOUND VAC Right 02/12/2016   Procedure: RE-APPLICATION OF WOUND VAC TO RIGHT CHEST WOUND;  Surgeon: Loel Lofty Dillingham, DO;  Location: [place];  Service: Plastics;  Laterality: Right;   BIV UPGRADE N/A 06/07/2019   Procedure: BIV UPGRADE;  Surgeon:  Deboraha Sprang, MD;  Location: [place] [facility];  Service: Cardiovascular;  Laterality: N/A;   BREAST BIOPSY Left 11/25/06   neg   BREAST BIOPSY Left 01/20/12   /clip-neg   CARDIAC CATHETERIZATION  11/2013   [facility]   CARDIAC CATHETERIZATION  10/2014   [facility]   CARDIAC VALVE REPLACEMENT     CARDIOVERSION N/A 07/20/2018   Procedure: CARDIOVERSION;  Surgeon: Wellington Hampshire, MD;  Location: [facility];  Service: Cardiovascular;  Laterality: N/A;   CARDIOVERSION N/A 09/18/2020   Procedure: CARDIOVERSION;  Surgeon: Minna Merritts, MD;  Location: [facility];  Service: Cardiovascular;  Laterality: N/A;   CATARACT EXTRACTION W/ INTRAOCULAR LENS  IMPLANT, BILATERAL Bilateral 2014   CLIPPING OF ATRIAL APPENDAGE N/A 04/25/2015   Procedure: CLIPPING OF ATRIAL APPENDAGE;  Surgeon: Rexene Alberts, MD;  Location: [place];  Service: Open Heart Surgery;  Laterality: N/A;   COCHLEAR IMPLANT Left 2005?   COLONOSCOPY WITH PROPOFOL N/A 01/20/2018   Procedure: COLONOSCOPY WITH PROPOFOL;  Surgeon: Toledo, Benay Pike, MD;  Location: [facility];  Service:  Gastroenterology;  Laterality: N/A;   CORONARY ANGIOPLASTY     CORONARY ARTERY BYPASS GRAFT N/A 04/25/2015   Procedure: CORONARY ARTERY BYPASS GRAFTING (CABG) x ONE, using left internal mammary artery;  Surgeon: Rexene Alberts, MD;  Location: [place];  Service: Open Heart Surgery;  Laterality: N/A;   DILATION AND CURETTAGE OF UTERUS  "several before hysterectomy"   EP IMPLANTABLE DEVICE N/A 05/02/2015   Procedure: Pacemaker Implant;  Surgeon: Thompson Grayer, MD;  Location: [place] [facility];  Service: Cardiovascular;  Laterality: N/A;   ESOPHAGOGASTRODUODENOSCOPY (EGD) WITH PROPOFOL N/A 01/20/2018   Procedure: ESOPHAGOGASTRODUODENOSCOPY (EGD) WITH PROPOFOL;  Surgeon: Toledo, Benay Pike, MD;  Location: [facility];  Service: Gastroenterology;  Laterality: N/A;   EYE SURGERY     I & D EXTREMITY Right 12/06/2015   Procedure: IRRIGATION AND DEBRIDEMENT RIGHT CHEST WALL WITH ACELL PLACEMENT AND VAC;  Surgeon: Loel Lofty Dillingham, DO;  Location: [place];  Service: Plastics;  Laterality: Right;   INCISION AND DRAINAGE OF WOUND N/A 11/21/2015   Procedure: IRRIGATION AND DEBRIDEMENT WOUND;  Surgeon: Loel Lofty Dillingham, DO;  Location: [place];  Service: Plastics;  Laterality: N/A;   INCISION AND DRAINAGE OF WOUND Right 12/21/2015   Procedure: IRRIGATION AND DEBRIDEMENT right chest wall WOUND;  Surgeon: Loel Lofty Dillingham, DO;  Location: [place];  Service: Plastics;  Laterality: Right;  right chest wall    INCISION AND DRAINAGE OF WOUND Right 01/24/2016   Procedure: IRRIGATION AND DEBRIDEMENT RIGHT CHEST WALL WOUND;  Surgeon: Loel Lofty Dillingham, DO;  Location: [place];  Service: Plastics;  Laterality: Right;   INCISION AND DRAINAGE OF WOUND Right 03/28/2016   Procedure: IRRIGATION AND DEBRIDEMENT RIGHT CHEST WALL WOUND WITH A Cell Placement;  Surgeon: Loel Lofty Dillingham, DO;  Location: [place];  Service: Plastics;  Laterality: Right;   INCISION AND DRAINAGE OF WOUND Right 05/17/2016   Procedure: IRRIGATION AND DEBRIDEMENT  OF RIGHT CHEST WOUND WITH A CELL PLACEMENT;  Surgeon: Loel Lofty Dillingham, DO;  Location: [place];  Service: Plastics;  Laterality: Right;   INSERT / REPLACE / REMOVE PACEMAKER     IRRIGATION AND DEBRIDEMENT OF WOUND WITH SPLIT THICKNESS SKIN GRAFT Right 01/11/2016   Procedure: IRRIGATION AND DEBRIDEMENT OF RIGHT CHEST WOUND ;  Surgeon: Loel Lofty Dillingham, DO;  Location: [place];  Service: Plastics;  Laterality: Right;   MASTECTOMY, PARTIAL Right 2002   positive, partial  MAZE N/A 04/25/2015   Procedure: MAZE;  Surgeon: Rexene Alberts, MD;  Location: [place];  Service: Open Heart Surgery;  Laterality: N/A;   MITRAL VALVE REPAIR N/A 04/25/2015   Procedure: MITRAL VALVE  REPLACEMENT using a 27 mm Edwards Perimount Magna Mitral Ease Valve;  Surgeon: Rexene Alberts, MD;  Location: [place];  Service: Open Heart Surgery;  Laterality: N/A;   RIGHT HEART CATH N/A 02/08/2019   Procedure: RIGHT HEART CATH;  Surgeon: Wellington Hampshire, MD;  Location: [place] [facility];  Service: Cardiovascular;  Laterality: N/A;   SKIN SPLIT GRAFT Right 02/12/2016   Procedure: IRRIGATION AND DEBRIDEMENT RIGHT CHEST WOUND;  Surgeon: Loel Lofty Dillingham, DO;  Location: [place];  Service: Plastics;  Laterality: Right;   STERNAL WIRES REMOVAL N/A 06/05/2015   Procedure: STERNAL WIRES REMOVAL;  Surgeon: Rexene Alberts, MD;  Location: [place];  Service: Thoracic;  Laterality: N/A;   STERNAL WOUND DEBRIDEMENT N/A 05/09/2015   Procedure: STERNAL WOUND DEBRIDEMENT;  Surgeon: Rexene Alberts, MD;  Location: [place];  Service: Thoracic;  Laterality: N/A;   STERNAL WOUND DEBRIDEMENT N/A 11/13/2015   Procedure: Excisional drainage of RIGHT Chest wall mass and breast mass ;  Surgeon: Rexene Alberts, MD;  Location: [place];  Service: Thoracic;  Laterality: N/A;   TEE WITHOUT CARDIOVERSION N/A 04/25/2015   Procedure: TRANSESOPHAGEAL ECHOCARDIOGRAM (TEE);  Surgeon: Rexene Alberts, MD;  Location: [place];  Service: Open Heart Surgery;  Laterality: N/A;    TONSILLECTOMY     TRAM Right 11/18/2015   Procedure: VRAM Vertical Rectus Abdominus Muscle Flap;  Surgeon: Loel Lofty Dillingham, DO;  Location: [place];  Service: Plastics;  Laterality: Right;  RIght Back   TRICUSPID VALVE REPLACEMENT N/A 04/25/2015   Procedure: TRICUSPID VALVE REPAIR;  Surgeon: Rexene Alberts, MD;  Location: [place];  Service: Open Heart Surgery;  Laterality: N/A;   VAGINAL HYSTERECTOMY      Current Medications: Current Meds  Medication Sig   acetaminophen (TYLENOL) 500 MG tablet Take 1,000 mg by mouth every 6 (six) hours as needed (for pain.).   amiodarone (PACERONE) 200 MG tablet TAKE 1 TABLET BY MOUTH EVERY DAY   bisoprolol (ZEBETA) 5 MG tablet Take 0.5 tablets (2.5 mg total) by mouth daily.   Cyanocobalamin (VITAMIN B-12 PO) Take 1,000 mcg by mouth daily.   diphenhydrAMINE (BENADRYL) 25 mg capsule Take 25 mg by mouth every 6 (six) hours as needed for allergies.   docusate sodium (COLACE) 100 MG capsule Take 100 mg by mouth 2 (two) times daily as needed (constipation.).   Ferrous Sulfate (IRON PO) Take 1 tablet by mouth daily.   furosemide (LASIX) 20 MG tablet TAKE 2 TABLETS (40 MG) IN THE MORNING AND 1 TABLET (20) MG IN THE EVENING.   gabapentin (NEURONTIN) 100 MG capsule TAKE 2 CAPSULES BY MOUTH AT BEDTIME.   ipratropium-albuterol (DUONEB) 0.5-2.5 (3) MG/3ML SOLN Inhale 3 mLs into the lungs every 6 (six) hours as needed.   levothyroxine (SYNTHROID) 88 MCG tablet TAKE 1 TABLET BY MOUTH DAILY BEFORE BREAKFAST.   Multiple Vitamins-Minerals (PRESERVISION AREDS 2 PO) Take 1 tablet by mouth 2 (two) times daily.    OXYGEN Inhale 2 L into the lungs continuous.   pantoprazole (PROTONIX) 40 MG tablet Take 1 tablet (40 mg total) by mouth 2 (two) times daily.   sacubitril-valsartan (ENTRESTO) 49-51 MG Take 1 tablet (49/51 mg) by mouth twice daily   simvastatin (ZOCOR) 10 MG tablet TAKE 1 TABLET  BY MOUTH EVERYDAY AT BEDTIME   spironolactone (ALDACTONE) 25 MG tablet TAKE 1 TABLET BY  MOUTH EVERY DAY   warfarin (COUMADIN) 2.5 MG tablet TAKE AS DIRECTED PER COUMADIN CLINIC     Allergies:   Ace inhibitors, Amoxicillin-pot clavulanate, Penicillins, Rosuvastatin, Nifedipine, Propranolol, Diazepam, Meperidine, and Propoxyphene   Social History   Socioeconomic History   Marital status: Married    Spouse name: Not on file   Number of children: 0   Years of education: Not on file   Highest education level: Not on file  Occupational History   Not on file  Tobacco Use   Smoking status: Former    Packs/day: 1.50    Years: 40.00    Total pack years: 60.00    Types: Cigarettes    Quit date: 04/20/1998    Years since quitting: 24.4   Smokeless tobacco: Never  Vaping Use   Vaping Use: Never used  Substance and Sexual Activity   Alcohol use: Yes    Comment: 11/10/2015 "glass of wine a few times/year, if that"   Drug use: No   Sexual activity: Yes  Other Topics Concern   Not on file  Social History Narrative   Not on file   Social Determinants of Health   Financial Resource Strain: Low Risk  (08/21/2022)   Overall Financial Resource Strain (CARDIA)    Difficulty of Paying Living Expenses: Not hard at all  Food Insecurity: No Food Insecurity (08/21/2022)   Hunger Vital Sign    Worried About Running Out of Food in the Last Year: Never true    Ran Out of Food in the Last Year: Never true  Transportation Needs: No Transportation Needs (08/21/2022)   PRAPARE - Hydrologist (Medical): No    Lack of Transportation (Non-Medical): No  Physical Activity: Inactive (10/01/2021)   Exercise Vital Sign    Days of Exercise per Week: 0 days    Minutes of Exercise per Session: 0 min  Stress: No Stress Concern Present (10/01/2021)   [place]    Feeling of Stress : Not at all  Social Connections: Moderately Isolated (10/01/2021)   Social Connection and Isolation Panel [NHANES]     Frequency of Communication with Friends and Family: More than three times a week    Frequency of Social Gatherings with Friends and Family: Three times a week    Attends Religious Services: Never    Active Member of Clubs or Organizations: No    Attends Archivist Meetings: Never    Marital Status: Married     Family History: The patient's family history includes Breast cancer in her maternal aunt and paternal aunt; CVA in her mother; Heart attack in her paternal uncle; Heart disease in her brother and father.  ROS:   Please see the history of present illness.     All other systems reviewed and are negative.  EKGs/Labs/Other Studies Reviewed:    The following studies were reviewed today:  Echo 2022 1. Left ventricular ejection fraction, by estimation, is 35 to 40%. The  left ventricle has moderately decreased function. The left ventricle  demonstrates global hypokinesis. The left ventricular internal cavity size  was mildly dilated. Left ventricular  diastolic function could not be evaluated.   2. Right ventricular systolic function is severely reduced. The right  ventricular size is normal. There is severely elevated pulmonary artery  systolic pressure.   3. Left atrial  size was mild to moderately dilated.   4. The mitral valve has been repaired/replaced. Trivial mitral valve  regurgitation. The mean mitral valve gradient is 5.0 mmHg. There is a 27  mm Edwards [facility] Ease bioprosthetic valve present in the mitral  position. Procedure Date: 2016.   5. The tricuspid valve is has been repaired/replaced.   6. The aortic valve has an indeterminant number of cusps. There is mild  thickening of the aortic valve. Aortic valve regurgitation is mild. No  aortic stenosis is present.   7. The inferior vena cava is normal in size with greater than 50%  respiratory variability, suggesting right atrial pressure of 3 mmHg.   Echo 03/2019  1. The left ventricle has  moderately reduced systolic function, with an  ejection fraction of 35-40%. The cavity size was normal. There is  moderately increased left ventricular wall thickness. Left ventricular  diastolic function could not be evaluated  due to mitral valve replacement/repair. Left ventricular diffuse  hypokinesis.   2. The right ventricle has moderately reduced systolic function. The  cavity was mildly enlarged. There is not assessed. Right ventricular  systolic pressure is upper normal with an estimated pressure of 34.6 mmHg.   3. Left atrial size was mildly dilated.   4. A 27 mm bioprosthetic valve is present in the mitral position. Normal  mitral valve prosthesis.   5. The pericardial effusion is posterior to the left ventricle.   6. Trivial pericardial effusion is present.   7. Status post tricuspid valve repair. valve is present in the tricuspid  position.   8. The aortic valve was not well visualized. Aortic valve regurgitation  is trivial by color flow Doppler.   9. The aorta is normal in size and structure.  10. The interatrial septum was not well visualized.    EKG:  EKG is ordered today.  The ekg ordered today demonstrates AV paced rhythm, 73bpm, PVCs  Recent Labs: 08/21/2022: ALT 19; BUN 21; Creatinine, Ser 1.07; Hemoglobin 11.8; Platelets 141; Potassium 3.8; Sodium 142; TSH 0.958  Recent Lipid Panel    Component Value Date/Time   CHOL 153 08/21/2021 0856   TRIG 80 08/21/2021 0856   HDL 46 08/21/2021 0856   CHOLHDL 3.3 08/21/2021 0856   LDLCALC 92 08/21/2021 0856    Physical Exam:    VS:  BP 122/66 (BP Location: Left Arm, Patient Position: Sitting, Cuff Size: Normal)   Pulse 73   Ht '5\' 1"'$  (1.549 m)   Wt 114 lb 12.8 oz (52.1 kg)   SpO2 95% Comment: 2 liters  BMI 21.69 kg/m     Wt Readings from Last 3 Encounters:  10/01/22 114 lb 12.8 oz (52.1 kg)  08/21/22 113 lb (51.3 kg)  08/20/22 114 lb (51.7 kg)     GEN:  Well nourished, well developed in no acute  distress HEENT: Normal NECK: No JVD; No carotid bruits LYMPHATICS: No lymphadenopathy CARDIAC: RRR, + murmur, no rubs, gallops RESPIRATORY:  Clear to auscultation without rales, wheezing or rhonchi  ABDOMEN: Soft, non-tender, non-distended MUSCULOSKELETAL:  1+ lower leg edema; No deformity  SKIN: Warm and dry NEUROLOGIC:  Alert and oriented x 3 PSYCHIATRIC:  Normal affect   ASSESSMENT:    1. Chronic systolic heart failure ([place])   2. NICM (nonischemic cardiomyopathy) ([place])   3. S/P mitral valve replacement with bioprosthetic valve   4. Mitral valve insufficiency, unspecified etiology   5. Complete heart block ([place])   6. S/P placement of cardiac pacemaker  7. Coronary artery disease involving native coronary artery of native heart with angina pectoris ([place])   8. Persistent atrial fibrillation ([place])   9. Hyperlipidemia, mixed    PLAN:    In order of problems listed above:  HFrEF Mixed ICM/NICM LVEF 35-40% Longstanding LVEF of 35-40%. The patient has chronic lower leg edema, she wears compression socks most days. Patient is taking lasix '40mg'$  in the am and '20mg'$  in the pm. Continue Entresto 49-51 mg BID, bisoprolol 2.'5mg'$  daily, and spironolactone '25mg'$  daily.   S/p MV replacement with bioprosthetic valve Echo in 02/2021 showed normally functioning valve.  CAD s/p CABG x1 LIMA to LAD The patient denies chest pain. Continue statin and BB therapy. No ASA with warfarin. No further ischemic work-up is indicated.   COPD on 2L O2 She is on baseline O2. No acute exacerbation.   Persistent Afib AV paced rhythm today on EKG. The patient reports less Afib episodes since PPM adjustment. No afib episodes noted on most recent PPM check. Continue warfarin for stroke ppx, most recent INR 2.2. Continue amiodarone '200mg'$  daily and bisoprolol 2.'5mg'$  daily.   HLD LDL 92 in 2022. Can update at follow-up.   CHB S/p PPM Followed by Dr. Caryl Comes  Disposition: Follow up in 6 month(s) with MD     Signed, Brandey Vandalen Ninfa Meeker, PA-C  10/01/2022 4:34 PM    [place]

## 2022-10-02 ENCOUNTER — Ambulatory Visit: Payer: Medicare Other

## 2022-10-04 ENCOUNTER — Ambulatory Visit (INDEPENDENT_AMBULATORY_CARE_PROVIDER_SITE_OTHER): Payer: No Typology Code available for payment source

## 2022-10-04 DIAGNOSIS — Z0000 Encounter for general adult medical examination without abnormal findings: Secondary | ICD-10-CM | POA: Diagnosis not present

## 2022-10-04 NOTE — Patient Instructions (Signed)

## 2022-10-04 NOTE — Progress Notes (Signed)
I connected with  [name] on 10/04/22 by a audio enabled telemedicine application and verified that I am speaking with the correct person using two identifiers.  Patient Location: Home  Provider Location: Office/Clinic  I discussed the limitations of evaluation and management by telemedicine. The patient expressed understanding and agreed to proceed.  Subjective:   [name] is a 88 y.o. female who presents for Medicare Annual (Subsequent) preventive examination.  Review of Systems    Per HPI unless specifically indicated below.  Cardiac Risk Factors include: advanced age (>43mn, >>83women);female gender          Objective:       10/01/2022    3:20 PM 08/21/2022    7:48 AM 08/20/2022    9:51 AM  Vitals with BMI  Height '5\' 1"'$  '5\' 1"'$  '5\' 1"'$   Weight 114 lbs 13 oz 113 lbs 114 lbs  BMI 21.7 [phone] Systolic [id number] Diastolic 66 68 64  Pulse 73 79 79    There were no vitals filed for this visit. There is no height or weight on file to calculate BMI.     10/04/2022    8:28 AM 10/01/2021    8:21 AM 06/07/2019    6:27 AM 02/22/2019    9:00 PM 02/22/2019   11:36 AM 02/08/2019    9:47 AM 02/05/2019    4:09 PM  Advanced Directives  Does Patient Have a Medical Advance Directive? Yes Yes No No Yes Yes Yes  Type of AParamedicof APettyLiving will HWyevilleLiving will   Healthcare Power of AWykoffwill  Does patient want to make changes to medical advance directive? No - Patient declined   No - Patient declined  No - Patient declined   Copy of [place]in [name]? No - copy requested No - copy requested   No - copy requested No - copy requested   Would patient like information on creating a medical advance directive?   No - Patient declined   No - Patient declined     Current Medications (verified) Outpatient Encounter Medications as of 10/04/2022  Medication Sig    acetaminophen (TYLENOL) 500 MG tablet Take 1,000 mg by mouth every 6 (six) hours as needed (for pain.).   amiodarone (PACERONE) 200 MG tablet TAKE 1 TABLET BY MOUTH EVERY DAY   bisoprolol (ZEBETA) 5 MG tablet Take 0.5 tablets (2.5 mg total) by mouth daily.   Cyanocobalamin (VITAMIN B-12 PO) Take 1,000 mcg by mouth daily.   diphenhydrAMINE (BENADRYL) 25 mg capsule Take 25 mg by mouth every 6 (six) hours as needed for allergies.   docusate sodium (COLACE) 100 MG capsule Take 100 mg by mouth 2 (two) times daily as needed (constipation.).   Ferrous Sulfate (IRON PO) Take 1 tablet by mouth daily.   furosemide (LASIX) 20 MG tablet TAKE 2 TABLETS (40 MG) IN THE MORNING AND 1 TABLET (20) MG IN THE EVENING.   gabapentin (NEURONTIN) 100 MG capsule TAKE 2 CAPSULES BY MOUTH AT BEDTIME.   ipratropium-albuterol (DUONEB) 0.5-2.5 (3) MG/3ML SOLN Inhale 3 mLs into the lungs every 6 (six) hours as needed.   levothyroxine (SYNTHROID) 88 MCG tablet TAKE 1 TABLET BY MOUTH DAILY BEFORE BREAKFAST.   Multiple Vitamins-Minerals (PRESERVISION AREDS 2 PO) Take 1 tablet by mouth 2 (two) times daily.    OXYGEN Inhale 2 L into the lungs continuous.   pantoprazole (  PROTONIX) 40 MG tablet Take 1 tablet (40 mg total) by mouth 2 (two) times daily.   sacubitril-valsartan (ENTRESTO) 49-51 MG Take 1 tablet (49/51 mg) by mouth twice daily   simvastatin (ZOCOR) 10 MG tablet TAKE 1 TABLET BY MOUTH EVERYDAY AT BEDTIME   spironolactone (ALDACTONE) 25 MG tablet TAKE 1 TABLET BY MOUTH EVERY DAY   warfarin (COUMADIN) 2.5 MG tablet TAKE AS DIRECTED PER COUMADIN CLINIC   No facility-administered encounter medications on file as of 10/04/2022.    Allergies (verified) Ace inhibitors, Amoxicillin-pot clavulanate, Penicillins, Rosuvastatin, Nifedipine, Propranolol, Diazepam, Meperidine, and Propoxyphene   History: Past Medical History:  Diagnosis Date   Acute on chronic respiratory failure with hypoxia ([place]) 02/04/2019   Added  automatically from request for surgery [id number]   Acute on chronic systolic heart failure ([place])    Anxiety    Arthritis    "hands" (11/10/2015)   Breast cancer ([place]) 2002   right   CAD s/p CABG x 1    a. 04/2015 LIMA to LAD   Chronic combined systolic (congestive) and diastolic (congestive) heart failure ([place])    a. 01/2015 Echo: EF 50-55%, Gr2 DD (preop valve surgery); b. 08/2015 Echo: EF 30-35%, diff HK. Gr2 DD; c. 09/2016 Echo: EF 50-55%; d. 07/2018 Echo: EF 20-25%; e. 03/2019 Echo: EF 35-40%, diff HK. Mod red RV fxn, RVSP 34.40mHg. Mildly dil LA. Nl MV prosthesis. Triv AI.   COPD (chronic obstructive pulmonary disease) (HCC)    Coronary artery disease    GERD (gastroesophageal reflux disease)    History of colon polyps    History of mitral valve replacement    [facility]. [facility]   Hyperlipidemia    Hypertension    Hypothyroidism    Maze operation for AF w/ LAA clipping    a. 04/2015 Complete bilateral atrial lesion set using cryothermy and bipolar radiofrequency ablation with clipping of LA appendage   Meniere disease    Meniere's disease    On home oxygen therapy    "2L at night" (11/10/2015)   PAF (paroxysmal atrial fibrillation) ([place]    Personal history of radiation therapy    Pneumonia ~ 2010   PONV (postoperative nausea and vomiting)    Pt felt like eardrums were gonna pop   Post-surgical complete heart block, symptomatic    a. 04/2015 s/p MDT [id number] DR MRI DC PPM.   Presence of permanent cardiac pacemaker    Pulmonary hypertension (HCC)    [place] 02/22/2019   Surgical wound, non healing - chest wall 11/10/2015   Tricuspid Regurgitation s/p Repair    a. 04/2015 s/p 26 mm Edwards mc3 ring annuloplasty   Wound infection 10/13/2015   Superficial sternal wound infection   Past Surgical History:  Procedure Laterality Date   APPENDECTOMY     APPLICATION OF A-CELL OF CHEST/ABDOMEN N/A 11/21/2015    Procedure: APPLICATION OF A-CELL OF CHEST/ABDOMEN;  Surgeon: CLoel LoftyDillingham, DO;  [place]  Service: Plastics;  Laterality: N/A;   APPLICATION OF A-CELL OF CHEST/ABDOMEN Right 12/21/2015   Procedure: APPLICATION OF A-CELL OF RIGHT CHEST;  Surgeon: CLoel LoftyDillingham, DO;  Location: [place]  Service: Plastics;  Laterality: Right;   APPLICATION OF A-CELL OF CHEST/ABDOMEN Right 01/11/2016   Procedure: APPLICATION OF A-CELL OF RIGHT CHEST;  Surgeon: CLoel LoftyDillingham, DO;  [place]  Service: Plastics;  Laterality: Right;   APPLICATION OF A-CELL OF CHEST/ABDOMEN Right 02/12/2016  Procedure: APPLICATION OF A-CELL TO RIGHT CHEST WOUND;  Surgeon: Loel Lofty Dillingham, DO;  Location: [place];  Service: Plastics;  Laterality: Right;   APPLICATION OF WOUND VAC N/A 05/09/2015   Procedure: APPLICATION OF WOUND VAC;  Surgeon: Rexene Alberts, MD;  Location: [place];  Service: Thoracic;  Laterality: N/A;   APPLICATION OF WOUND VAC N/A 11/13/2015   Procedure: APPLICATION OF WOUND VAC;  Surgeon: Rexene Alberts, MD;  Location: [place];  Service: Thoracic;  Laterality: N/A;   APPLICATION OF WOUND VAC N/A 11/21/2015   Procedure: APPLICATION OF WOUND VAC;  Surgeon: Loel Lofty Dillingham, DO;  Location: [place];  Service: Plastics;  Laterality: N/A;   APPLICATION OF WOUND VAC Right 12/21/2015   Procedure: APPLICATION OF WOUND VAC to right chest wall ;  Surgeon: Loel Lofty Dillingham, DO;  Location: [place];  Service: Plastics;  Laterality: Right;   APPLICATION OF WOUND VAC Right 01/11/2016   Procedure: APPLICATION OF WOUND VAC RIGHT CHEST ;  Surgeon: Loel Lofty Dillingham, DO;  Location: [place];  Service: Plastics;  Laterality: Right;   APPLICATION OF WOUND VAC Right 01/24/2016   Procedure: APPLICATION OF WOUND VAC RIGHT CHEST WALL;  Surgeon: Loel Lofty Dillingham, DO;  Location: [place];  Service: Plastics;  Laterality: Right;   APPLICATION OF WOUND VAC Right 02/12/2016   Procedure: RE-APPLICATION OF WOUND VAC TO RIGHT CHEST  WOUND;  Surgeon: Loel Lofty Dillingham, DO;  Location: [place];  Service: Plastics;  Laterality: Right;   BIV UPGRADE N/A 06/07/2019   Procedure: BIV UPGRADE;  Surgeon: Deboraha Sprang, MD;  Location: [place] CV LAB;  Service: Cardiovascular;  Laterality: N/A;   BREAST BIOPSY Left 11/25/06   neg   BREAST BIOPSY Left 01/20/12   /clip-neg   CARDIAC CATHETERIZATION  11/2013   [facility]   CARDIAC CATHETERIZATION  10/2014   [facility]   CARDIAC VALVE REPLACEMENT     CARDIOVERSION N/A 07/20/2018   Procedure: CARDIOVERSION;  Surgeon: Wellington Hampshire, MD;  Location: [facility];  Service: Cardiovascular;  Laterality: N/A;   CARDIOVERSION N/A 09/18/2020   Procedure: CARDIOVERSION;  Surgeon: Minna Merritts, MD;  Location: [facility];  Service: Cardiovascular;  Laterality: N/A;   CATARACT EXTRACTION W/ INTRAOCULAR LENS  IMPLANT, BILATERAL Bilateral 2014   CLIPPING OF ATRIAL APPENDAGE N/A 04/25/2015   Procedure: CLIPPING OF ATRIAL APPENDAGE;  Surgeon: Rexene Alberts, MD;  Location: [place];  Service: Open Heart Surgery;  Laterality: N/A;   COCHLEAR IMPLANT Left 2005?   COLONOSCOPY WITH PROPOFOL N/A 01/20/2018   Procedure: COLONOSCOPY WITH PROPOFOL;  Surgeon: Toledo, Benay Pike, MD;  Location: [facility];  Service: Gastroenterology;  Laterality: N/A;   CORONARY ANGIOPLASTY     CORONARY ARTERY BYPASS GRAFT N/A 04/25/2015   Procedure: CORONARY ARTERY BYPASS GRAFTING (CABG) x ONE, using left internal mammary artery;  Surgeon: Rexene Alberts, MD;  Location: [place];  Service: Open Heart Surgery;  Laterality: N/A;   DILATION AND CURETTAGE OF UTERUS  "several before hysterectomy"   EP IMPLANTABLE DEVICE N/A 05/02/2015   Procedure: Pacemaker Implant;  Surgeon: Thompson Grayer, MD;  Location: [place] CV LAB;  Service: Cardiovascular;  Laterality: N/A;   ESOPHAGOGASTRODUODENOSCOPY (EGD) WITH PROPOFOL N/A 01/20/2018   Procedure: ESOPHAGOGASTRODUODENOSCOPY (EGD) WITH PROPOFOL;  Surgeon: Toledo, Benay Pike, MD;  Location: [facility];  Service: Gastroenterology;  Laterality: N/A;   EYE SURGERY     I & D EXTREMITY Right 12/06/2015   Procedure: IRRIGATION AND DEBRIDEMENT RIGHT CHEST  WALL WITH ACELL PLACEMENT AND VAC;  Surgeon: Loel Lofty Dillingham, DO;  Location: [place];  Service: Plastics;  Laterality: Right;   INCISION AND DRAINAGE OF WOUND N/A 11/21/2015   Procedure: IRRIGATION AND DEBRIDEMENT WOUND;  Surgeon: Loel Lofty Dillingham, DO;  Location: [place];  Service: Plastics;  Laterality: N/A;   INCISION AND DRAINAGE OF WOUND Right 12/21/2015   Procedure: IRRIGATION AND DEBRIDEMENT right chest wall WOUND;  Surgeon: Loel Lofty Dillingham, DO;  Location: [place];  Service: Plastics;  Laterality: Right;  right chest wall    INCISION AND DRAINAGE OF WOUND Right 01/24/2016   Procedure: IRRIGATION AND DEBRIDEMENT RIGHT CHEST WALL WOUND;  Surgeon: Loel Lofty Dillingham, DO;  Location: [place];  Service: Plastics;  Laterality: Right;   INCISION AND DRAINAGE OF WOUND Right 03/28/2016   Procedure: IRRIGATION AND DEBRIDEMENT RIGHT CHEST WALL WOUND WITH A Cell Placement;  Surgeon: Loel Lofty Dillingham, DO;  Location: [place];  Service: Plastics;  Laterality: Right;   INCISION AND DRAINAGE OF WOUND Right 05/17/2016   Procedure: IRRIGATION AND DEBRIDEMENT OF RIGHT CHEST WOUND WITH A CELL PLACEMENT;  Surgeon: Loel Lofty Dillingham, DO;  Location: [place];  Service: Plastics;  Laterality: Right;   INSERT / REPLACE / REMOVE PACEMAKER     IRRIGATION AND DEBRIDEMENT OF WOUND WITH SPLIT THICKNESS SKIN GRAFT Right 01/11/2016   Procedure: IRRIGATION AND DEBRIDEMENT OF RIGHT CHEST WOUND ;  Surgeon: Loel Lofty Dillingham, DO;  Location: [place];  Service: Plastics;  Laterality: Right;   MASTECTOMY, PARTIAL Right 2002   positive, partial   MAZE N/A 04/25/2015   Procedure: MAZE;  Surgeon: Rexene Alberts, MD;  Location: [place];  Service: Open Heart Surgery;  Laterality: N/A;   MITRAL VALVE REPAIR N/A 04/25/2015   Procedure: MITRAL VALVE  REPLACEMENT using a 27 mm Edwards  Perimount Magna Mitral Ease Valve;  Surgeon: Rexene Alberts, MD;  Location: [place];  Service: Open Heart Surgery;  Laterality: N/A;   RIGHT HEART CATH N/A 02/08/2019   Procedure: RIGHT HEART CATH;  Surgeon: Wellington Hampshire, MD;  Location: [place] CV LAB;  Service: Cardiovascular;  Laterality: N/A;   SKIN SPLIT GRAFT Right 02/12/2016   Procedure: IRRIGATION AND DEBRIDEMENT RIGHT CHEST WOUND;  Surgeon: Loel Lofty Dillingham, DO;  Location: [place];  Service: Plastics;  Laterality: Right;   STERNAL WIRES REMOVAL N/A 06/05/2015   Procedure: STERNAL WIRES REMOVAL;  Surgeon: Rexene Alberts, MD;  Location: [place];  Service: Thoracic;  Laterality: N/A;   STERNAL WOUND DEBRIDEMENT N/A 05/09/2015   Procedure: STERNAL WOUND DEBRIDEMENT;  Surgeon: Rexene Alberts, MD;  Location: [place];  Service: Thoracic;  Laterality: N/A;   STERNAL WOUND DEBRIDEMENT N/A 11/13/2015   Procedure: Excisional drainage of RIGHT Chest wall mass and breast mass ;  Surgeon: Rexene Alberts, MD;  Location: [place];  Service: Thoracic;  Laterality: N/A;   TEE WITHOUT CARDIOVERSION N/A 04/25/2015   Procedure: TRANSESOPHAGEAL ECHOCARDIOGRAM (TEE);  Surgeon: Rexene Alberts, MD;  Location: [place];  Service: Open Heart Surgery;  Laterality: N/A;   TONSILLECTOMY     TRAM Right 11/18/2015   Procedure: VRAM Vertical Rectus Abdominus Muscle Flap;  Surgeon: Loel Lofty Dillingham, DO;  Location: [place];  Service: Plastics;  Laterality: Right;  RIght Back   TRICUSPID VALVE REPLACEMENT N/A 04/25/2015   Procedure: TRICUSPID VALVE REPAIR;  Surgeon: Rexene Alberts, MD;  Location: [place];  Service: Open Heart Surgery;  Laterality: N/A;   VAGINAL HYSTERECTOMY  Family History  Problem Relation Age of Onset   Heart disease Father    Heart disease Brother    CVA Mother    Heart attack Paternal Uncle    Breast cancer Maternal Aunt    Breast cancer Paternal Aunt    Social History   Socioeconomic History   Marital status: Married    Spouse name: Gelene Recktenwald   Number of children: 0   Years of education: Not on file   Highest education level: Not on file  Occupational History   Occupation: Retired  Tobacco Use   Smoking status: Former    Packs/day: 1.50    Years: 40.00    Total pack years: 60.00    Types: Cigarettes    Quit date: 04/20/1998    Years since quitting: 24.4   Smokeless tobacco: Never  Vaping Use   Vaping Use: Never used  Substance and Sexual Activity   Alcohol use: Yes    Comment: 11/10/2015 "glass of wine a few times/year, if that"   Drug use: No   Sexual activity: Yes  Other Topics Concern   Not on file  Social History Narrative   Not on file   Social Determinants of Health   Financial Resource Strain: Low Risk  (10/04/2022)   Overall Financial Resource Strain (CARDIA)    Difficulty of Paying Living Expenses: Not hard at all  Food Insecurity: No Food Insecurity (10/04/2022)   Hunger Vital Sign    Worried About Running Out of Food in the Last Year: Never true    [place] in the Last Year: Never true  Transportation Needs: No Transportation Needs (10/04/2022)   PRAPARE - Hydrologist (Medical): No    Lack of Transportation (Non-Medical): No  Physical Activity: Inactive (10/04/2022)   Exercise Vital Sign    Days of Exercise per Week: 0 days    Minutes of Exercise per Session: 0 min  Stress: No Stress Concern Present (10/04/2022)   [place]    Feeling of Stress : Not at all  Social Connections: Moderately Isolated (10/04/2022)   Social Connection and Isolation Panel [NHANES]    Frequency of Communication with Friends and Family: More than three times a week    Frequency of Social Gatherings with Friends and Family: Twice a week    Attends Religious Services: Never    Marine scientist or Organizations: No    Attends Music therapist: Never    Marital Status: Married    Tobacco  Counseling Counseling given: No   Clinical Intake:  Pre-visit preparation completed: No  Pain : No/denies pain     Nutritional Status: BMI of 19-24  Normal Nutritional Risks: None Diabetes: No  How often do you need to have someone help you when you read instructions, pamphlets, or other written materials from your doctor or pharmacy?: 1 - Never  Diabetic?No     Information entered by :: Donnie Mesa, CMA   Activities of Daily Living    10/04/2022    8:13 AM  In your present state of health, do you have any difficulty performing the following activities:  Hearing? 1  Comment cochlear implants  Vision? 1  Comment Lt eye legally blind, [facility]  Difficulty concentrating or making decisions? 0  Walking or climbing stairs? 1  Comment balance, vision issues  Dressing or bathing? 0  Doing errands, shopping? 1  Patient Care Team: Glean Hess, MD as PCP - General (Internal Medicine) Wellington Hampshire, MD as PCP - Cardiology (Cardiology) Deboraha Sprang, MD as PCP - Electrophysiology (Cardiology) Alisa Graff, FNP as Nurse Practitioner (Family Medicine) Flora Lipps, MD as Consulting Physician (Pulmonary Disease) Lonia Farber, MD as Consulting Physician (Internal Medicine)  Indicate any recent Medical Services you may have received from other than [facility] providers in the past year (date may be approximate). The pt was senn at [facility] by Parke Poisson, MD on 03/19/2022 for sciatica.    Assessment:   This is a routine wellness examination for Huong.  Hearing/Vision screen Hearing Screening - Comments:: Pt has cochlear implant Vision Screening - Comments:: Annual vision screenings done by [facility] Dr. Peter Garter Dietary issues and exercise activities discussed: Current Exercise Habits: The patient does not participate in regular exercise at present, Exercise limited by: orthopedic condition(s);Other - see  comments   Goals Addressed   None    Depression Screen    10/04/2022    8:12 AM 08/21/2022    7:56 AM 04/01/2022    2:18 PM 02/19/2022    8:16 AM 10/01/2021    8:20 AM 08/21/2021    8:23 AM 05/22/2021    3:10 PM  PHQ 2/9 Scores  PHQ - 2 Score 0 0 0 0 0 0 0  PHQ- 9 Score  '1 1 1  '$ 0 1    Fall Risk    08/21/2022    7:57 AM 04/01/2022    2:19 PM 02/19/2022    8:17 AM 10/01/2021    8:22 AM 08/21/2021    8:24 AM  Fall Risk   Falls in the past year? 0 0 0 1 0  Number falls in past yr: 0 0 0 1 0  Injury with Fall? 0 0 0 0 0  Risk for fall due to : No Fall Risks No Fall Risks No Fall Risks Impaired balance/gait;History of fall(s);Impaired vision History of fall(s);Impaired balance/gait  Follow up Falls evaluation completed Falls evaluation completed Falls evaluation completed Falls prevention discussed Falls evaluation completed    FALL RISK PREVENTION PERTAINING TO THE HOME:  Any stairs in or around the home? No  If so, are there any without handrails? No  Home free of loose throw rugs in walkways, pet beds, electrical cords, etc? Yes  Adequate lighting in your home to reduce risk of falls? Yes   ASSISTIVE DEVICES UTILIZED TO PREVENT FALLS:  Life alert? No  Use of a cane, walker or w/c? Yes  Grab bars in the bathroom? Yes  Shower chair or bench in shower? Yes  Elevated toilet seat or a handicapped toilet? Yes   TIMED UP AND GO:  Was the test performed?  unable perform  .    Cognitive Function:        10/04/2022    8:15 AM  6CIT Screen  What Year? 0 points  What month? 0 points  What time? 0 points  Count back from 20 0 points  Months in reverse 0 points  Repeat phrase 0 points  Total Score 0 points    Immunizations Immunization History  Administered Date(s) Administered   Fluad Quad(high Dose 65+) 05/22/2021, 08/05/2022   Influenza, High Dose Seasonal PF 06/27/2015, 06/12/2016, 06/10/2017, 04/30/2018, 06/23/2020   Influenza, Seasonal, Injecte, Preservative  Fre 06/21/2015   Influenza-Unspecified 06/20/2012, 06/16/2013, 06/28/2014   PFIZER Comirnaty(Gray Top)Covid-19 Tri-Sucrose Vaccine 05/23/2020   PFIZER(Purple Top)SARS-COV-2 Vaccination 09/16/2019, 10/07/2019  PNEUMOCOCCAL CONJUGATE-20 08/06/2022   Pfizer Covid-19 Vaccine Bivalent Booster 70yr & up 07/05/2021   Pneumococcal Conjugate-13 07/22/2016   Pneumococcal Polysaccharide-23 09/09/1992, 09/25/2005, 09/19/2014   Respiratory Syncytial Virus Vaccine,Recomb Aduvanted(Arexvy) 08/06/2022   Tdap 07/18/2017   Unspecified SARS-COV-2 Vaccination 08/05/2022   Zoster Recombinat (Shingrix) 04/02/2018, 07/14/2018    TDAP status: Up to date  Flu Vaccine status: Up to date  Pneumococcal vaccine status: Up to date  Covid-19 vaccine status: Information provided on how to obtain vaccines.   Qualifies for Shingles Vaccine? Yes   Zostavax completed Yes   Shingrix Completed?: Yes  Screening Tests Health Maintenance  Topic Date Due   COVID-19 Vaccine (6 - 2023-24 season) 09/30/2022   MAMMOGRAM  10/05/2023 (Originally 09/25/2022)   Medicare Annual Wellness (AWV)  10/05/2023   DTaP/Tdap/Td (2 - Td or Tdap) 07/19/2027   Pneumonia Vaccine 88 Years old  Completed   INFLUENZA VACCINE  Completed   DEXA SCAN  Completed   Zoster Vaccines- Shingrix  Completed   HPV VACCINES  Aged Out    Health Maintenance  Health Maintenance Due  Topic Date Due   COVID-19 Vaccine (6 - 2023-24 season) 09/30/2022    Colorectal cancer screening: No longer required.   Mammogram status: No longer required due to age.  DEXA Scan: 10/17/2020  Lung Cancer Screening: (Low Dose CT Chest recommended if Age 86-80years, 30 pack-year currently smoking OR have quit w/in 15years.) does not qualify.   Lung Cancer Screening Referral: not applicable   Additional Screening:  Hepatitis C Screening: does not qualify  Vision Screening: Recommended annual ophthalmology exams for early detection of glaucoma and other  disorders of the eye. Is the patient up to date with their annual eye exam?  Yes  Who is the provider or what is the name of the office in which the patient attends annual [facility] exams[facility] If pt is not established with a provider, would they like to be referred to a provider to establish care? No .   Dental Screening: Recommended annual dental exams for proper oral hygiene  Community Resource Referral / Chronic Care Management: CRR required this visit?  No   CCM required this visit?  No      Plan:     I have personally reviewed and noted the following in the patient's [name]:   Medical and social history Use of alcohol, tobacco or illicit drugs  Current medications and supplements including opioid prescriptions. Patient is not currently taking opioid prescriptions. Functional ability and status Nutritional status Physical activity Advanced directives List of other physicians Hospitalizations, surgeries, and ER visits in previous 12 months Vitals Screenings to include cognitive, depression, and falls Referrals and appointments  In addition, I have reviewed and discussed with patient certain preventive protocols, quality metrics, and best practice recommendations. A written personalized care plan for preventive services as well as general preventive health recommendations were provided to patient.    Ms. MNorell, Thank you for taking time to come for your Medicare Wellness Visit. I appreciate your ongoing commitment to your health goals. Please review the following plan we discussed and let me know if I can assist you in the future.   These are the goals we discussed:  Goals      Prevent falls     Recommend preventing falls at home         This is a list of the screening recommended for you and due dates:  Health Maintenance  Topic  Date Due   COVID-19 Vaccine (6 - 2023-24 season) 09/30/2022   Mammogram  10/05/2023*   Medicare Annual Wellness Visit   10/05/2023   DTaP/Tdap/Td vaccine (2 - Td or Tdap) 07/19/2027   Pneumonia Vaccine  Completed   Flu Shot  Completed   DEXA scan (bone density measurement)  Completed   Zoster (Shingles) Vaccine  Completed   HPV Vaccine  Aged Out  *Topic was postponed. The date shown is not the original due date.     Wilson Singer, [place]   10/04/2022   Nurse Notes: Approximately 30 minute Non-Face -To-Face Medicare Wellness Visit

## 2022-10-24 ENCOUNTER — Ambulatory Visit (INDEPENDENT_AMBULATORY_CARE_PROVIDER_SITE_OTHER): Payer: Medicare Other | Admitting: Internal Medicine

## 2022-10-24 ENCOUNTER — Encounter: Payer: Self-pay | Admitting: Internal Medicine

## 2022-10-24 VITALS — BP 110/60 | HR 86 | Temp 98.0°F | Ht 61.0 in | Wt 114.2 lb

## 2022-10-24 DIAGNOSIS — J449 Chronic obstructive pulmonary disease, unspecified: Secondary | ICD-10-CM

## 2022-10-24 DIAGNOSIS — I25119 Atherosclerotic heart disease of native coronary artery with unspecified angina pectoris: Secondary | ICD-10-CM | POA: Diagnosis not present

## 2022-10-24 DIAGNOSIS — J9611 Chronic respiratory failure with hypoxia: Secondary | ICD-10-CM

## 2022-10-24 NOTE — Patient Instructions (Addendum)
Continue nebulizers as prescribed Continue oxygen as prescribed Follow-up with cardiology as scheduled Continue amiodarone as prescribed   Avoid secondhand smoke Avoid SICK contacts Recommend  Masking  when appropriate Recommend Keep up-to-date with vaccinations

## 2022-10-24 NOTE — Progress Notes (Signed)
PULMONARY OFFICE FOLLOW UP NOTE  Date of initial consultation: 07/27/15 Reason for consultation: COPD  PT PROFILE:  [name] former smoker (up to 1.5 PPD, quit 1999) initially seen in 2016 for COPD. Previously followed by Dr Raul Del. She was first diagnosed with COPD in 2012 at the time of a pneumonia.   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 PFTs 03/17/15: Spirometry reveals mild/moderate obstruction (FEV1 67% predicted, FEV1/FVC 62%).  Lung volumes normal.  Diffusion capacity markedly reduced at 35% predicted.  DLCO/VA 54% predicted Echocardiogram 08/22/15: LVEF 30-35%. Diffuse HK. prosthetic valves functioning well ONO on RA 02/22/16: significant desaturation to low of 78%. Total desaturation events: 170 Echocardiogram 09/17/16: LVEF 50-55%.  LA mildly dilated.  RVSP est normal Echocardiogram 07/17/18: LVEF 20-25%.  Diffuse hypokinesis.  Prosthetic mitral valve functioning normally.  LA mildly dilated.  RV moderately dilated.  RV systolic function moderately reduced.  PA systolic pressure could not be accurately estimated Echocardiogram 08/24/18: LVEF 35-40%.  Mild concentric hypertrophy.  Diffuse hypokinesis.  Bioprosthetic mitral valve functioning normally.  LA mildly dilated.  RV cavity mildly dilated.  RVSP could not be accurately estimated. Echocardiogram 10/26/18: LVEF 35-40%.  LA moderately dilated.  Bioprosthetic mitral valve present.  RV systolic function mildly reduced.  RV cavity moderately enlarged.  RA moderately dilated.  RVSP est 26 mmHg R heart cath 02/08/19: Mean PCWP 23 mmHg. Large V wave c/w MR.  Echocardiogram 04/01/19: LVEF 35-40%.  Moderate LVH.  LA mildly dilated.  Bioprosthetic mitral valve present.  RV mildly dilated with moderately reduced systolic function.  RVSP 35 mmHg. PAP 69/24 mmHg  PROBLEMS: Mild COPD CHF S/P Maze procedure, CABG with  chronic sternal wound infection   CC  Follow up COPD Follow up hypoxic respiratory failure  HPI Previous history of COVID-19 infection June 2020 Chronic shortness of breath and dyspnea exertion Seems to be stable at this time  No exacerbation at this time No evidence of heart failure at this time No evidence or signs of infection at this time No respiratory distress No fevers, chills, nausea, vomiting, diarrhea No evidence of lower extremity edema No evidence hemoptysis   A. fib seems to be well-controlled at this time on amiodarone therapy She takes 100 mg daily  Patient uses and benefits from oxygen therapy she needs this for survival 24/7 oxygen use    BP 110/60 (BP Location: Left Arm, Cuff Size: Normal)   Pulse 86   Temp 98 F (36.7 C) (Temporal)   Ht 5' 1"$  (1.549 m)   Wt 114 lb 3.2 oz (51.8 kg)   SpO2 90%   BMI 21.58 kg/m     Review of Systems: Gen:  Denies  fever, sweats, chills weight loss  HEENT: Denies blurred vision, double vision, ear pain, eye pain, hearing loss, nose bleeds, sore throat Cardiac:  No dizziness, chest pain or heaviness, chest tightness,edema, No JVD Resp:   No cough, -sputum production, -shortness of breath,-wheezing, -hemoptysis,  Other:  All other systems negative    Physical Examination:   General Appearance: No distress Severe kyphosis EYES PERRLA, EOM intact.   NECK Supple, No JVD Pulmonary: normal breath sounds, No wheezing.  CardiovascularNormal S1,S2.  No m/r/g.   Abdomen: Benign, Soft, non-tender. ALL OTHER ROS ARE NEGATIVE           Latest Ref Rng & Units 08/21/2022    8:44 AM 02/19/2022  9:05 AM 12/03/2021   12:00 AM  BMP  Glucose 70 - 99 mg/dL 100  115    BUN 8 - 27 mg/dL 21  17  20      $ Creatinine 0.57 - 1.00 mg/dL 1.07  0.96  0.9      BUN/Creat Ratio 12 - 28 20  18    $ Sodium 134 - 144 mmol/L 142  145  145      Potassium 3.5 - 5.2 mmol/L 3.8  3.7  3.8      Chloride 96 - 106 mmol/L 96  100  100       CO2 20 - 29 mmol/L 33  31  38      Calcium 8.7 - 10.3 mg/dL 9.3  9.1       This result is from an external source.       Latest Ref Rng & Units 08/21/2022    8:44 AM 04/01/2022    2:46 PM 02/19/2022    9:05 AM  CBC  WBC 3.4 - 10.8 x10E3/uL 6.7  8.2  6.7   Hemoglobin 11.1 - 15.9 g/dL 11.8  11.7  9.9   Hematocrit 34.0 - 46.6 % 35.3  36.0  30.6   Platelets 150 - 450 x10E3/uL 141  169  169    CXR 02/22/19: Status post sternotomy.  AICD in place.  Cardiomegaly.  Patchy bilateral infiltrates.   ASSESSMENT PLAN  88 year old pleasant white female seen today with a history of COPD With a history of CAD with AICD with previous COVID infection several years ago Associated with chronic hypoxic respiratory failure and respiratory insufficiency    COPD severe Gold stage D chronic shortness of breath and dyspnea on exertion continue nebulized bronchodilator therapy no indication for prednisone antibiotics at this time pulmonary function would not be feasible in the setting of chronic respiratory insufficiency    Chronic hypoxic respiratory failure on oxygen therapy patient benefits and needs this for survival  Amiodarone therapy for A. 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 Continue anticoagulation as prescribed Continue oxygen as prescribed   Chronic respiratory insufficiency patient is very debilitated and her respiratory status is tenuous oxygen therapy is vital in needed for survival continue nebulized therapy as prescribed   chronic heart failure with pulmonary hypertension follow-up cardiology continue amiodarone continue anticoagulation Chronic respiratory insufficiency   MEDICATION ADJUSTMENTS/LABS AND TESTS  ORDERED: Continue nebulizers as prescribed Continue oxygen as prescribed Follow-up with cardiology as scheduled Continue amiodarone as prescribed No indication for CT at this time   [place]   Patient satisfied with Plan of action and management. All questions answered  Follow-up in 1 year   Total Time spent 35 mins  Shaianne Nucci Patricia Pesa, M.D.  [facility]  Medical Director [place] Director [facility] Cardio-Pulmonary Department

## 2022-10-30 ENCOUNTER — Ambulatory Visit: Payer: Medicare Other | Attending: Cardiovascular Disease | Admitting: Pharmacist

## 2022-10-30 DIAGNOSIS — I340 Nonrheumatic mitral (valve) insufficiency: Secondary | ICD-10-CM

## 2022-10-30 DIAGNOSIS — Z5181 Encounter for therapeutic drug level monitoring: Secondary | ICD-10-CM | POA: Diagnosis present

## 2022-10-30 DIAGNOSIS — Z953 Presence of xenogenic heart valve: Secondary | ICD-10-CM

## 2022-10-30 LAB — POCT INR: INR: 2.7 (ref 2.0–3.0)

## 2022-10-30 NOTE — Patient Instructions (Signed)
Description   Continue 1 tablet every day, EXCEPT 0.5 TABLET ON MONDAY, WEDNESDAY, and FRIDAY - Recheck INR in 6 weeks.

## 2022-11-05 ENCOUNTER — Other Ambulatory Visit: Payer: Self-pay | Admitting: Cardiovascular Disease

## 2022-11-05 NOTE — Telephone Encounter (Signed)
Prescription refill request received for warfarin Lov: 10/01/2022 Next INR check: 4/3 Warfarin tablet strength: 2.'5mg'$ 

## 2022-11-05 NOTE — Telephone Encounter (Signed)
Refill request

## 2022-11-15 ENCOUNTER — Encounter: Payer: Self-pay | Admitting: Internal Medicine

## 2022-11-15 ENCOUNTER — Ambulatory Visit (INDEPENDENT_AMBULATORY_CARE_PROVIDER_SITE_OTHER): Payer: Medicare Other | Admitting: Internal Medicine

## 2022-11-15 VITALS — BP 120/56 | HR 95 | Ht 61.0 in | Wt 112.0 lb

## 2022-11-15 DIAGNOSIS — D6869 Other thrombophilia: Secondary | ICD-10-CM | POA: Diagnosis not present

## 2022-11-15 DIAGNOSIS — N1831 Chronic kidney disease, stage 3a: Secondary | ICD-10-CM | POA: Diagnosis not present

## 2022-11-15 DIAGNOSIS — L03115 Cellulitis of right lower limb: Secondary | ICD-10-CM | POA: Diagnosis not present

## 2022-11-15 MED ORDER — MUPIROCIN 2 % EX OINT: 1.0000 | TOPICAL_OINTMENT | Freq: Two times a day (BID) | CUTANEOUS | 0 refills | Status: DC

## 2022-11-15 MED ORDER — CEPHALEXIN 500 MG PO CAPS: 500.0000 mg | ORAL_CAPSULE | Freq: Three times a day (TID) | ORAL | 0 refills | Status: AC

## 2022-11-15 NOTE — Progress Notes (Signed)
Date:  11/15/2022   Name:  [name]   DOB:  1934/12/12   MRN:  [id number]   Chief Complaint: Open Wound (X 10 days,Right leg, painful, got worse, redness, purple, tried antibiotic ointment that was 86 years old )  Wound Check She was originally treated 10 to 14 days ago. Prior ED Treatment: none. There has been bloody discharge from the wound. The redness has worsened. The swelling has worsened. The pain has not changed.  She sustained the skin tear when she caught her fingernail on her shin while pulling her pants leg down 10 days ago.  Lab Results  Component Value Date   NA 142 08/21/2022   K 3.8 08/21/2022   CO2 33 (H) 08/21/2022   GLUCOSE 100 (H) 08/21/2022   BUN 21 08/21/2022   CREATININE 1.07 (H) 08/21/2022   CALCIUM 9.3 08/21/2022   EGFR 51 (L) 08/21/2022   GFRNONAA 55 (L) 03/09/2021   Lab Results  Component Value Date   CHOL 153 08/21/2021   HDL 46 08/21/2021   LDLCALC 92 08/21/2021   TRIG 80 08/21/2021   CHOLHDL 3.3 08/21/2021   Lab Results  Component Value Date   TSH 0.958 08/21/2022   Lab Results  Component Value Date   HGBA1C 5.4 08/21/2022   Lab Results  Component Value Date   WBC 6.7 08/21/2022   HGB 11.8 08/21/2022   HCT 35.3 08/21/2022   MCV 100 (H) 08/21/2022   PLT 141 (L) 08/21/2022   Lab Results  Component Value Date   ALT 19 08/21/2022   AST 18 08/21/2022   ALKPHOS 70 08/21/2022   BILITOT 0.3 08/21/2022   No results found for: "25OHVITD2", "25OHVITD3", "VD25OH"   Review of Systems  Constitutional:  Negative for fatigue and fever.  Respiratory:  Positive for shortness of breath. Negative for cough and chest tightness.   Cardiovascular:  Positive for leg swelling. Negative for chest pain.  Skin:  Positive for wound.  Neurological:  Negative for dizziness and headaches.  Psychiatric/Behavioral:  Negative for dysphoric mood and sleep disturbance. The patient is not nervous/anxious.     Patient Active Problem List   Diagnosis Date  Noted   Chronic respiratory failure with hypoxia ([place]) 04/08/2022   Gastroesophageal reflux disease 02/19/2022   Sciatica of left side 08/21/2021   Aortoiliac occlusive disease ([place]) 05/22/2021   Cochlear implant in place 05/22/2021   Acquired thrombophilia ([place]) 08/11/2020   Stage 3a chronic kidney disease (CKD) ([place]) 08/11/2020   Primary insomnia 07/31/2019   Osteoporosis, postmenopausal 01/23/2018   Coronary artery disease involving native coronary artery of native heart with angina pectoris ([place]) 01/13/2017   S/P placement of cardiac pacemaker 05/02/15, medtronic 05/03/2015   Bradycardia 05/03/2015   Presence of cardiac pacemaker 05/03/2015   S/P mitral valve replacement with bioprosthetic valve, tricuspid valve repair, maze procedure and CABG x1 04/25/2015   S/P tricuspid valve repair 04/25/2015   S/P Maze operation for atrial fibrillation 04/25/2015   S/P CABG x 1 04/25/2015   Pulmonary hypertension (HCC)    Tricuspid regurgitation    Chronic combined systolic and diastolic CHF (congestive heart failure) ([place])    Chronic systolic heart failure ([place]) 11/10/2014   Essential hypertension 11/10/2014   Mitral regurgitation 11/10/2014   Hyperlipidemia 11/10/2014   Atrial fibrillation ([place]) 11/10/2014   Prediabetes 05/08/2014   Auditory vertigo 05/08/2014   Adult hypothyroidism 05/08/2014   H/O adenomatous polyp of colon 05/08/2014   Stage 4 very severe COPD by GOLD  classification ([place]) 03/28/2014    Allergies  Allergen Reactions   Ace Inhibitors Cough and Other (See Comments)   Amoxicillin-Pot Clavulanate Swelling and Other (See Comments)    SWOLLEN JOINTS  Has patient had a PCN reaction causing immediate rash, facial/tongue/throat swelling, SOB or lightheadedness with hypotension: Yes Has patient had a PCN reaction causing severe rash involving mucus membranes or skin necrosis: No Has patient had a PCN reaction that required hospitalization No Has patient had a PCN reaction  occurring within the last 10 years: No If all of the above answers are "NO", then may proceed with Cephalosporin   Penicillins Other (See Comments) and Swelling    SWOLLEN JOINTS   Has patient had a PCN reaction causing immediate rash, facial/tongue/throat swelling, SOB or lightheadedness with hypotension: Yes  Has patient had a PCN reaction causing severe rash involving mucus membranes or skin necrosis: No  Has patient had a PCN reaction that required hospitalization No  Has patient had a PCN reaction occurring within the last 10 years: No  If all of the above answers are "NO", then may proceed with Cephalosporin use.  Swollen joints  Swollen joints, Has patient had a PCN reaction causing immediate rash, facial/tongue/throat swelling, SOB or lightheadedness with hypotension: Yes, Has patient had a PCN reaction causing severe rash involving mucus membranes or skin necrosis: No, Has patient had a PCN reaction that required hospitalization No, Has patient had a PCN reaction occurring within the last 10 years: No, If all of the above answers are "NO", then may proceed with Cephalosporin use.   Rosuvastatin Other (See Comments)   Nifedipine Other (See Comments)    UNSPECIFIED   Propranolol Other (See Comments)    UNSPECIFIED   Diazepam Other (See Comments)    Made her "hyper"    Meperidine Other (See Comments)    Made her "hyper"     Propoxyphene Other (See Comments)    Made her "hyper"      Past Surgical History:  Procedure Laterality Date   APPENDECTOMY     APPLICATION OF A-CELL OF CHEST/ABDOMEN N/A 11/21/2015   Procedure: APPLICATION OF A-CELL OF CHEST/ABDOMEN;  Surgeon: Loel Lofty Dillingham, DO;  Location: [place];  Service: Plastics;  Laterality: N/A;   APPLICATION OF A-CELL OF CHEST/ABDOMEN Right 12/21/2015   Procedure: APPLICATION OF A-CELL OF RIGHT CHEST;  Surgeon: Loel Lofty Dillingham, DO;  Location: [place];  Service: Plastics;  Laterality: Right;   APPLICATION OF A-CELL OF  CHEST/ABDOMEN Right 01/11/2016   Procedure: APPLICATION OF A-CELL OF RIGHT CHEST;  Surgeon: Loel Lofty Dillingham, DO;  Location: [place];  Service: Plastics;  Laterality: Right;   APPLICATION OF A-CELL OF CHEST/ABDOMEN Right 02/12/2016   Procedure: APPLICATION OF A-CELL TO RIGHT CHEST WOUND;  Surgeon: Loel Lofty Dillingham, DO;  Location: [place];  Service: Plastics;  Laterality: Right;   APPLICATION OF WOUND VAC N/A 05/09/2015   Procedure: APPLICATION OF WOUND VAC;  Surgeon: Rexene Alberts, MD;  Location: [place];  Service: Thoracic;  Laterality: N/A;   APPLICATION OF WOUND VAC N/A 11/13/2015   Procedure: APPLICATION OF WOUND VAC;  Surgeon: Rexene Alberts, MD;  Location: [place];  Service: Thoracic;  Laterality: N/A;   APPLICATION OF WOUND VAC N/A 11/21/2015   Procedure: APPLICATION OF WOUND VAC;  Surgeon: Loel Lofty Dillingham, DO;  Location: [place];  Service: Plastics;  Laterality: N/A;   APPLICATION OF WOUND VAC Right 12/21/2015   Procedure: APPLICATION OF WOUND VAC to right chest wall ;  Surgeon: Loel Lofty Dillingham, DO;  Location: [place];  Service: Plastics;  Laterality: Right;   APPLICATION OF WOUND VAC Right 01/11/2016   Procedure: APPLICATION OF WOUND VAC RIGHT CHEST ;  Surgeon: Loel Lofty Dillingham, DO;  Location: [place];  Service: Plastics;  Laterality: Right;   APPLICATION OF WOUND VAC Right 01/24/2016   Procedure: APPLICATION OF WOUND VAC RIGHT CHEST WALL;  Surgeon: Loel Lofty Dillingham, DO;  Location: [place];  Service: Plastics;  Laterality: Right;   APPLICATION OF WOUND VAC Right 02/12/2016   Procedure: RE-APPLICATION OF WOUND VAC TO RIGHT CHEST WOUND;  Surgeon: Loel Lofty Dillingham, DO;  Location: [place];  Service: Plastics;  Laterality: Right;   BIV UPGRADE N/A 06/07/2019   Procedure: BIV UPGRADE;  Surgeon: Deboraha Sprang, MD;  Location: [place] [facility];  Service: Cardiovascular;  Laterality: N/A;   BREAST BIOPSY Left 11/25/06   neg   BREAST BIOPSY Left 01/20/12   /clip-neg   CARDIAC [facility]ATION  11/2013    [facility]   CARDIAC CATHETERIZATION  10/2014   [facility]   CARDIAC VALVE REPLACEMENT     CARDIOVERSION N/A 07/20/2018   Procedure: CARDIOVERSION;  Surgeon: Wellington Hampshire, MD;  Location: [facility];  Service: Cardiovascular;  Laterality: N/A;   CARDIOVERSION N/A 09/18/2020   Procedure: CARDIOVERSION;  Surgeon: Minna Merritts, MD;  Location: [facility];  Service: Cardiovascular;  Laterality: N/A;   CATARACT EXTRACTION W/ INTRAOCULAR LENS  IMPLANT, BILATERAL Bilateral 2014   CLIPPING OF ATRIAL APPENDAGE N/A 04/25/2015   Procedure: CLIPPING OF ATRIAL APPENDAGE;  Surgeon: Rexene Alberts, MD;  Location: [place];  Service: Open Heart Surgery;  Laterality: N/A;   COCHLEAR IMPLANT Left 2005?   COLONOSCOPY WITH PROPOFOL N/A 01/20/2018   Procedure: COLONOSCOPY WITH PROPOFOL;  Surgeon: Toledo, Benay Pike, MD;  Location: [facility];  Service: Gastroenterology;  Laterality: N/A;   CORONARY ANGIOPLASTY     CORONARY ARTERY BYPASS GRAFT N/A 04/25/2015   Procedure: CORONARY ARTERY BYPASS GRAFTING (CABG) x ONE, using left internal mammary artery;  Surgeon: Rexene Alberts, MD;  Location: [place];  Service: Open Heart Surgery;  Laterality: N/A;   DILATION AND CURETTAGE OF UTERUS  "several before hysterectomy"   EP IMPLANTABLE DEVICE N/A 05/02/2015   Procedure: Pacemaker Implant;  Surgeon: Thompson Grayer, MD;  Location: [place] [facility];  Service: Cardiovascular;  Laterality: N/A;   ESOPHAGOGASTRODUODENOSCOPY (EGD) WITH PROPOFOL N/A 01/20/2018   Procedure: ESOPHAGOGASTRODUODENOSCOPY (EGD) WITH PROPOFOL;  Surgeon: Toledo, Benay Pike, MD;  Location: [facility];  Service: Gastroenterology;  Laterality: N/A;   EYE SURGERY     I & D EXTREMITY Right 12/06/2015   Procedure: IRRIGATION AND DEBRIDEMENT RIGHT CHEST WALL WITH ACELL PLACEMENT AND VAC;  Surgeon: Loel Lofty Dillingham, DO;  Location: [place];  Service: Plastics;  Laterality: Right;   INCISION AND DRAINAGE OF WOUND N/A 11/21/2015   Procedure: IRRIGATION AND DEBRIDEMENT  WOUND;  Surgeon: Loel Lofty Dillingham, DO;  Location: [place];  Service: Plastics;  Laterality: N/A;   INCISION AND DRAINAGE OF WOUND Right 12/21/2015   Procedure: IRRIGATION AND DEBRIDEMENT right chest wall WOUND;  Surgeon: Loel Lofty Dillingham, DO;  Location: [place];  Service: Plastics;  Laterality: Right;  right chest wall    INCISION AND DRAINAGE OF WOUND Right 01/24/2016   Procedure: IRRIGATION AND DEBRIDEMENT RIGHT CHEST WALL WOUND;  Surgeon: Loel Lofty Dillingham, DO;  Location: [place];  Service: Plastics;  Laterality: Right;   INCISION AND DRAINAGE OF WOUND Right 03/28/2016  Procedure: IRRIGATION AND DEBRIDEMENT RIGHT CHEST WALL WOUND WITH A Cell Placement;  Surgeon: Loel Lofty Dillingham, DO;  Location: [place];  Service: Plastics;  Laterality: Right;   INCISION AND DRAINAGE OF WOUND Right 05/17/2016   Procedure: IRRIGATION AND DEBRIDEMENT OF RIGHT CHEST WOUND WITH A CELL PLACEMENT;  Surgeon: Loel Lofty Dillingham, DO;  Location: [place];  Service: Plastics;  Laterality: Right;   INSERT / REPLACE / REMOVE PACEMAKER     IRRIGATION AND DEBRIDEMENT OF WOUND WITH SPLIT THICKNESS SKIN GRAFT Right 01/11/2016   Procedure: IRRIGATION AND DEBRIDEMENT OF RIGHT CHEST WOUND ;  Surgeon: Loel Lofty Dillingham, DO;  Location: [place];  Service: Plastics;  Laterality: Right;   MASTECTOMY, PARTIAL Right 2002   positive, partial   MAZE N/A 04/25/2015   Procedure: MAZE;  Surgeon: Rexene Alberts, MD;  Location: [place];  Service: Open Heart Surgery;  Laterality: N/A;   MITRAL VALVE REPAIR N/A 04/25/2015   Procedure: MITRAL VALVE  REPLACEMENT using a 27 mm Edwards Perimount Magna Mitral Ease Valve;  Surgeon: Rexene Alberts, MD;  Location: [place];  Service: Open Heart Surgery;  Laterality: N/A;   RIGHT HEART CATH N/A 02/08/2019   Procedure: RIGHT HEART CATH;  Surgeon: Wellington Hampshire, MD;  Location: [place] [facility];  Service: Cardiovascular;  Laterality: N/A;   SKIN SPLIT GRAFT Right 02/12/2016   Procedure: IRRIGATION AND DEBRIDEMENT  RIGHT CHEST WOUND;  Surgeon: Loel Lofty Dillingham, DO;  Location: [place];  Service: Plastics;  Laterality: Right;   STERNAL WIRES REMOVAL N/A 06/05/2015   Procedure: STERNAL WIRES REMOVAL;  Surgeon: Rexene Alberts, MD;  Location: [place];  Service: Thoracic;  Laterality: N/A;   STERNAL WOUND DEBRIDEMENT N/A 05/09/2015   Procedure: STERNAL WOUND DEBRIDEMENT;  Surgeon: Rexene Alberts, MD;  Location: [place];  Service: Thoracic;  Laterality: N/A;   STERNAL WOUND DEBRIDEMENT N/A 11/13/2015   Procedure: Excisional drainage of RIGHT Chest wall mass and breast mass ;  Surgeon: Rexene Alberts, MD;  Location: [place];  Service: Thoracic;  Laterality: N/A;   TEE WITHOUT CARDIOVERSION N/A 04/25/2015   Procedure: TRANSESOPHAGEAL ECHOCARDIOGRAM (TEE);  Surgeon: Rexene Alberts, MD;  Location: [place];  Service: Open Heart Surgery;  Laterality: N/A;   TONSILLECTOMY     TRAM Right 11/18/2015   Procedure: VRAM Vertical Rectus Abdominus Muscle Flap;  Surgeon: Loel Lofty Dillingham, DO;  Location: [place];  Service: Plastics;  Laterality: Right;  RIght Back   TRICUSPID VALVE REPLACEMENT N/A 04/25/2015   Procedure: TRICUSPID VALVE REPAIR;  Surgeon: Rexene Alberts, MD;  Location: [place];  Service: Open Heart Surgery;  Laterality: N/A;   VAGINAL HYSTERECTOMY      Social History   Tobacco Use   Smoking status: Former    Packs/day: 1.50    Years: 40.00    Total pack years: 60.00    Types: Cigarettes    Quit date: 04/20/1998    Years since quitting: 24.5   Smokeless tobacco: Never  Vaping Use   Vaping Use: Never used  Substance Use Topics   Alcohol use: Yes    Comment: 11/10/2015 "glass of wine a few times/year, if that"   Drug use: No     Medication list has been reviewed and updated.  Current Meds  Medication Sig   acetaminophen (TYLENOL) 500 MG tablet Take 1,000 mg by mouth every 6 (six) hours as needed (for pain.).   amiodarone (PACERONE) 200 MG tablet TAKE 1 TABLET BY  MOUTH EVERY DAY   bisoprolol (ZEBETA) 5 MG  tablet Take 0.5 tablets (2.5 mg total) by mouth daily.   cephALEXin (KEFLEX) 500 MG capsule Take 1 capsule (500 mg total) by mouth 3 (three) times daily for 10 days.   Cyanocobalamin (VITAMIN B-12 PO) Take 1,000 mcg by mouth daily.   diphenhydrAMINE (BENADRYL) 25 mg capsule Take 25 mg by mouth every 6 (six) hours as needed for allergies.   docusate sodium (COLACE) 100 MG capsule Take 100 mg by mouth 2 (two) times daily as needed (constipation.).   Ferrous Sulfate (IRON PO) Take 1 tablet by mouth daily.   furosemide (LASIX) 20 MG tablet TAKE 2 TABLETS (40 MG) IN THE MORNING AND 1 TABLET (20) MG IN THE EVENING.   gabapentin (NEURONTIN) 100 MG capsule TAKE 2 CAPSULES BY MOUTH AT BEDTIME.   ipratropium-albuterol (DUONEB) 0.5-2.5 (3) MG/3ML SOLN Inhale 3 mLs into the lungs every 6 (six) hours as needed.   levothyroxine (SYNTHROID) 88 MCG tablet TAKE 1 TABLET BY MOUTH DAILY BEFORE BREAKFAST.   Multiple Vitamins-Minerals (PRESERVISION AREDS 2 PO) Take 1 tablet by mouth 2 (two) times daily.    mupirocin ointment (BACTROBAN) 2 % Apply 1 Application topically 2 (two) times daily. To skin wound   OXYGEN Inhale 2 L into the lungs continuous.   pantoprazole (PROTONIX) 40 MG tablet Take 1 tablet (40 mg total) by mouth 2 (two) times daily.   sacubitril-valsartan (ENTRESTO) 49-51 MG Take 1 tablet (49/51 mg) by mouth twice daily   simvastatin (ZOCOR) 10 MG tablet TAKE 1 TABLET BY MOUTH EVERYDAY AT BEDTIME   spironolactone (ALDACTONE) 25 MG tablet TAKE 1 TABLET BY MOUTH EVERY DAY   warfarin (COUMADIN) 2.5 MG tablet Take 1/2 a tablet to 1 tablet by mouth daily as directed by the coumadin clinic.       11/15/2022    8:02 AM 08/21/2022    7:57 AM 04/01/2022    2:18 PM 02/19/2022    8:16 AM  GAD 7 : Generalized Anxiety Score  Nervous, Anxious, on Edge 0 0 0 0  Control/stop worrying 0 0 0 0  Worry too much - different things 0 0 0 0  Trouble relaxing 0 0 0 0  Restless 0 0 0 0  Easily annoyed or irritable 0 0 0  0  Afraid - awful might happen 0 0 0 0  Total GAD 7 Score 0 0 0 0  Anxiety Difficulty Not difficult at all Not difficult at all Not difficult at all Not difficult at all       11/15/2022    8:02 AM 10/04/2022    8:12 AM 08/21/2022    7:56 AM  Depression screen PHQ 2/9  Decreased Interest 0 0 0  Down, Depressed, Hopeless 0 0 0  PHQ - 2 Score 0 0 0  Altered sleeping 0  0  Tired, decreased energy 0  1  Change in appetite 0  0  Feeling bad or failure about yourself  0  0  Trouble concentrating 0  0  Moving slowly or fidgety/restless 0  0  Suicidal thoughts 0  0  PHQ-9 Score 0  1  Difficult doing work/chores Not difficult at all  Not difficult at all    BP Readings from Last 3 Encounters:  11/15/22 (!) 120/56  10/24/22 110/60  10/01/22 122/66    Physical Exam Vitals and nursing note reviewed.  Constitutional:      General: She is not in acute distress.  Appearance: She is well-developed.  HENT:     Head: Normocephalic and atraumatic.  Cardiovascular:     Rate and Rhythm: Normal rate and regular rhythm.  Pulmonary:     Effort: Pulmonary effort is normal. No respiratory distress.     Breath sounds: No wheezing or rhonchi.  Skin:    General: Skin is warm and dry.     Findings: Lesion present. No rash.          Comments:  1/2 inch triangular skin tear with surrounding erythema and mild swelling.  Neurological:     Mental Status: She is alert and oriented to person, place, and time.  Psychiatric:        Mood and Affect: Mood normal.        Behavior: Behavior normal.     Wt Readings from Last 3 Encounters:  11/15/22 112 lb (50.8 kg)  10/24/22 114 lb 3.2 oz (51.8 kg)  10/01/22 114 lb 12.8 oz (52.1 kg)    BP (!) 120/56   Pulse 95   Ht '5\' 1"'$  (1.549 m)   Wt 112 lb (50.8 kg)   SpO2 94%   BMI 21.16 kg/m   Assessment and Plan: Problem List Items Addressed This Visit       Genitourinary   Stage 3a chronic kidney disease (CKD) (HCC) (Chronic)    GFR stable in  the 50's        Hematopoietic and Hemostatic   Acquired thrombophilia ([place]) (Chronic)   Other Visit Diagnoses     Cellulitis of right lower extremity    -  Primary   as a result of a skin tear local care - return if not improving   Relevant Medications   mupirocin ointment (BACTROBAN) 2 %   cephALEXin (KEFLEX) 500 MG capsule        Partially dictated using Editor, commissioning. Any errors are unintentional.  Halina Maidens, MD [facility]  11/15/2022

## 2022-11-15 NOTE — Assessment & Plan Note (Signed)
GFR stable in the 50's

## 2022-12-05 ENCOUNTER — Ambulatory Visit (INDEPENDENT_AMBULATORY_CARE_PROVIDER_SITE_OTHER): Payer: Medicare Other

## 2022-12-05 DIAGNOSIS — I442 Atrioventricular block, complete: Secondary | ICD-10-CM | POA: Diagnosis not present

## 2022-12-09 ENCOUNTER — Telehealth: Payer: Self-pay | Admitting: Internal Medicine

## 2022-12-09 LAB — CUP PACEART REMOTE DEVICE CHECK
Battery Remaining Longevity: 22 mo
Battery Voltage: 2.93 V
Brady Statistic AP VP Percent: 86.44 %
Brady Statistic AP VS Percent: 0.7 %
Brady Statistic AS VP Percent: 12.8 %
Brady Statistic AS VS Percent: 0.06 %
Brady Statistic RA Percent Paced: 86.19 %
Brady Statistic RV Percent Paced: 98.53 %
Date Time Interrogation Session: 20240330072134
Implantable Lead Connection Status: 753985
Implantable Lead Connection Status: 753985
Implantable Lead Implant Date: 20160823
Implantable Lead Implant Date: 20160823
Implantable Lead Location: 753859
Implantable Lead Location: 753860
Implantable Lead Model: 5076
Implantable Lead Model: 5076
Implantable Pulse Generator Implant Date: 20160823
Lead Channel Impedance Value: 323 Ohm
Lead Channel Impedance Value: 361 Ohm
Lead Channel Impedance Value: 380 Ohm
Lead Channel Impedance Value: 380 Ohm
Lead Channel Pacing Threshold Amplitude: 0.5 V
Lead Channel Pacing Threshold Amplitude: 0.5 V
Lead Channel Pacing Threshold Pulse Width: 0.4 ms
Lead Channel Pacing Threshold Pulse Width: 0.4 ms
Lead Channel Sensing Intrinsic Amplitude: 1.25 mV
Lead Channel Sensing Intrinsic Amplitude: 1.25 mV
Lead Channel Sensing Intrinsic Amplitude: 12.875 mV
Lead Channel Sensing Intrinsic Amplitude: 12.875 mV
Lead Channel Setting Pacing Amplitude: 1.5 V
Lead Channel Setting Pacing Amplitude: 2 V
Lead Channel Setting Pacing Pulse Width: 0.4 ms
Lead Channel Setting Sensing Sensitivity: 2.8 mV
Zone Setting Status: 755011

## 2022-12-09 NOTE — Telephone Encounter (Signed)
Please schedule pt for a same day appointment.  KP

## 2022-12-09 NOTE — Telephone Encounter (Signed)
Copied from [place] [id number]. Topic: Appointment Scheduling - Scheduling Inquiry for Clinic >> Dec 09, 2022  9:13 AM [name] wrote: Reason for CRM: Pt is calling in to schedule an appointment with Dr. Army Melia. Pt says she has an area on her leg that is healing slowly and she wants to get it checked out. Earliest available appointment is 04/24, and pt says that doesn't work for her. Pt wants to know is there anything sooner available or can she be fit in? Please follow up with pt.

## 2022-12-09 NOTE — Telephone Encounter (Signed)
Noted  KP 

## 2022-12-10 ENCOUNTER — Encounter: Payer: Self-pay | Admitting: Internal Medicine

## 2022-12-10 ENCOUNTER — Ambulatory Visit (INDEPENDENT_AMBULATORY_CARE_PROVIDER_SITE_OTHER): Payer: Medicare Other | Admitting: Internal Medicine

## 2022-12-10 VITALS — BP 110/60 | HR 64 | Ht 61.0 in | Wt 112.0 lb

## 2022-12-10 DIAGNOSIS — L03115 Cellulitis of right lower limb: Secondary | ICD-10-CM

## 2022-12-10 DIAGNOSIS — S81811S Laceration without foreign body, right lower leg, sequela: Secondary | ICD-10-CM | POA: Diagnosis not present

## 2022-12-10 DIAGNOSIS — H3530 Unspecified macular degeneration: Secondary | ICD-10-CM | POA: Insufficient documentation

## 2022-12-10 DIAGNOSIS — I7409 Other arterial embolism and thrombosis of abdominal aorta: Secondary | ICD-10-CM

## 2022-12-10 MED ORDER — CEPHALEXIN 500 MG PO CAPS: 500.0000 mg | ORAL_CAPSULE | Freq: Four times a day (QID) | ORAL | 0 refills | Status: AC

## 2022-12-10 NOTE — Progress Notes (Signed)
Date:  12/10/2022   Name:  [name]   DOB:  [birth year]   MRN:  [id number]   Chief Complaint: Cellulitis (Of Right LE. Follow up from 11/15/2022. Getting better slowly. Started to hurt. Stinging and burning pains.)  HPI Skin tear - sustained a skin tear on right shin a month ago.  Initially very red and inflamed.  Completed 10 days of Keflex with improvement.  Still not healed and she is concerned.  It is minimally painful but worse at night. No fever or chills,  some mild drainage at the wound.  Lab Results  Component Value Date   NA 142 08/21/2022   K 3.8 08/21/2022   CO2 33 (H) 08/21/2022   GLUCOSE 100 (H) 08/21/2022   BUN 21 08/21/2022   CREATININE 1.07 (H) 08/21/2022   CALCIUM 9.3 08/21/2022   EGFR 51 (L) 08/21/2022   GFRNONAA 55 (L) 03/09/2021   Lab Results  Component Value Date   CHOL 153 08/21/2021   HDL 46 08/21/2021   LDLCALC 92 08/21/2021   TRIG 80 08/21/2021   CHOLHDL 3.3 08/21/2021   Lab Results  Component Value Date   TSH 0.958 08/21/2022   Lab Results  Component Value Date   HGBA1C 5.4 08/21/2022   Lab Results  Component Value Date   WBC 6.7 08/21/2022   HGB 11.8 08/21/2022   HCT 35.3 08/21/2022   MCV 100 (H) 08/21/2022   PLT 141 (L) 08/21/2022   Lab Results  Component Value Date   ALT 19 08/21/2022   AST 18 08/21/2022   ALKPHOS 70 08/21/2022   BILITOT 0.3 08/21/2022   No results found for: "25OHVITD2", "25OHVITD3", "VD25OH"   Review of Systems  Constitutional:  Negative for chills and fever.  HENT:  Negative for trouble swallowing.   Eyes:  Positive for visual disturbance (severe macular degeneration).  Respiratory:  Negative for chest tightness, shortness of breath and wheezing.   Cardiovascular:  Negative for chest pain, palpitations and leg swelling.  Neurological:  Negative for dizziness, light-headedness and headaches.  Psychiatric/Behavioral:  Negative for dysphoric mood. The patient is not nervous/anxious.     Patient Active  Problem List   Diagnosis Date Noted   Macular degeneration 12/10/2022   Chronic respiratory failure with hypoxia 04/08/2022   Gastroesophageal reflux disease 02/19/2022   Sciatica of left side 08/21/2021   Aortoiliac occlusive disease 05/22/2021   Cochlear implant in place 05/22/2021   Acquired thrombophilia 08/11/2020   Stage 3a chronic kidney disease (CKD) 08/11/2020   Primary insomnia 07/31/2019   Osteoporosis, postmenopausal 01/23/2018   Coronary artery disease involving native coronary artery of native heart with angina pectoris 01/13/2017   S/P placement of cardiac pacemaker 05/02/15, medtronic 05/03/2015   Bradycardia 05/03/2015   Presence of cardiac pacemaker 05/03/2015   S/P mitral valve replacement with bioprosthetic valve, tricuspid valve repair, maze procedure and CABG x1 04/25/2015   S/P tricuspid valve repair 04/25/2015   S/P Maze operation for atrial fibrillation 04/25/2015   S/P CABG x 1 04/25/2015   Pulmonary hypertension (HCC)    Tricuspid regurgitation    Chronic combined systolic and diastolic CHF (congestive heart failure)    Chronic systolic heart failure [id number]   Essential hypertension 11/10/2014   Mitral regurgitation 11/10/2014   Hyperlipidemia 11/10/2014   Atrial fibrillation 11/10/2014   Prediabetes 05/08/2014   Auditory vertigo 05/08/2014   Adult hypothyroidism 05/08/2014   H/O adenomatous polyp of colon 05/08/2014   Stage 4 very severe COPD by GOLD  classification 03/28/2014    Allergies  Allergen Reactions   Ace Inhibitors Cough and Other (See Comments)   Amoxicillin-Pot Clavulanate Swelling and Other (See Comments)    SWOLLEN JOINTS  Has patient had a PCN reaction causing immediate rash, facial/tongue/throat swelling, SOB or lightheadedness with hypotension: Yes Has patient had a PCN reaction causing severe rash involving mucus membranes or skin necrosis: No Has patient had a PCN reaction that required hospitalization No Has patient had a  PCN reaction occurring within the last 10 years: No If all of the above answers are "NO", then may proceed with Cephalosporin   Penicillins Other (See Comments) and Swelling    SWOLLEN JOINTS   Has patient had a PCN reaction causing immediate rash, facial/tongue/throat swelling, SOB or lightheadedness with hypotension: Yes  Has patient had a PCN reaction causing severe rash involving mucus membranes or skin necrosis: No  Has patient had a PCN reaction that required hospitalization No  Has patient had a PCN reaction occurring within the last 10 years: No  If all of the above answers are "NO", then may proceed with Cephalosporin use.  Swollen joints  Swollen joints, Has patient had a PCN reaction causing immediate rash, facial/tongue/throat swelling, SOB or lightheadedness with hypotension: Yes, Has patient had a PCN reaction causing severe rash involving mucus membranes or skin necrosis: No, Has patient had a PCN reaction that required hospitalization No, Has patient had a PCN reaction occurring within the last 10 years: No, If all of the above answers are "NO", then may proceed with Cephalosporin use.   Rosuvastatin Other (See Comments)   Nifedipine Other (See Comments)    UNSPECIFIED   Propranolol Other (See Comments)    UNSPECIFIED   Diazepam Other (See Comments)    Made her "hyper"    Meperidine Other (See Comments)    Made her "hyper"     Propoxyphene Other (See Comments)    Made her "hyper"      Past Surgical History:  Procedure Laterality Date   APPENDECTOMY     APPLICATION OF A-CELL OF CHEST/ABDOMEN N/A 11/21/2015   Procedure: APPLICATION OF A-CELL OF CHEST/ABDOMEN;  Surgeon: Loel Lofty Dillingham, DO;  Location: [place];  Service: Plastics;  Laterality: N/A;   APPLICATION OF A-CELL OF CHEST/ABDOMEN Right 12/21/2015   Procedure: APPLICATION OF A-CELL OF RIGHT CHEST;  Surgeon: Loel Lofty Dillingham, DO;  Location: [place];  Service: Plastics;  Laterality: Right;   APPLICATION  OF A-CELL OF CHEST/ABDOMEN Right 01/11/2016   Procedure: APPLICATION OF A-CELL OF RIGHT CHEST;  Surgeon: Loel Lofty Dillingham, DO;  Location: [place];  Service: Plastics;  Laterality: Right;   APPLICATION OF A-CELL OF CHEST/ABDOMEN Right 02/12/2016   Procedure: APPLICATION OF A-CELL TO RIGHT CHEST WOUND;  Surgeon: Loel Lofty Dillingham, DO;  Location: [place];  Service: Plastics;  Laterality: Right;   APPLICATION OF WOUND VAC N/A 05/09/2015   Procedure: APPLICATION OF WOUND VAC;  Surgeon: Rexene Alberts, MD;  Location: [place];  Service: Thoracic;  Laterality: N/A;   APPLICATION OF WOUND VAC N/A 11/13/2015   Procedure: APPLICATION OF WOUND VAC;  Surgeon: Rexene Alberts, MD;  Location: [place];  Service: Thoracic;  Laterality: N/A;   APPLICATION OF WOUND VAC N/A 11/21/2015   Procedure: APPLICATION OF WOUND VAC;  Surgeon: Loel Lofty Dillingham, DO;  Location: [place];  Service: Plastics;  Laterality: N/A;   APPLICATION OF WOUND VAC Right 12/21/2015   Procedure: APPLICATION OF WOUND VAC to right chest wall ;  Surgeon: Loel Lofty Dillingham, DO;  Location: [place];  Service: Plastics;  Laterality: Right;   APPLICATION OF WOUND VAC Right 01/11/2016   Procedure: APPLICATION OF WOUND VAC RIGHT CHEST ;  Surgeon: Loel Lofty Dillingham, DO;  Location: [place];  Service: Plastics;  Laterality: Right;   APPLICATION OF WOUND VAC Right 01/24/2016   Procedure: APPLICATION OF WOUND VAC RIGHT CHEST WALL;  Surgeon: Loel Lofty Dillingham, DO;  Location: [place];  Service: Plastics;  Laterality: Right;   APPLICATION OF WOUND VAC Right 02/12/2016   Procedure: RE-APPLICATION OF WOUND VAC TO RIGHT CHEST WOUND;  Surgeon: Loel Lofty Dillingham, DO;  Location: [place];  Service: Plastics;  Laterality: Right;   BIV UPGRADE N/A 06/07/2019   Procedure: BIV UPGRADE;  Surgeon: Deboraha Sprang, MD;  Location: [place] [facility];  Service: Cardiovascular;  Laterality: N/A;   BREAST BIOPSY Left 11/25/06   neg   BREAST BIOPSY Left 01/20/12   /clip-neg   CARDIAC  CATHETERIZATION  11/2013   [facility]   CARDIAC CATHETERIZATION  10/2014   [facility]   CARDIAC VALVE REPLACEMENT     CARDIOVERSION N/A 07/20/2018   Procedure: CARDIOVERSION;  Surgeon: Wellington Hampshire, MD;  Location: [facility];  Service: Cardiovascular;  Laterality: N/A;   CARDIOVERSION N/A 09/18/2020   Procedure: CARDIOVERSION;  Surgeon: Minna Merritts, MD;  Location: [facility];  Service: Cardiovascular;  Laterality: N/A;   CATARACT EXTRACTION W/ INTRAOCULAR LENS  IMPLANT, BILATERAL Bilateral 2014   CLIPPING OF ATRIAL APPENDAGE N/A 04/25/2015   Procedure: CLIPPING OF ATRIAL APPENDAGE;  Surgeon: Rexene Alberts, MD;  Location: [place];  Service: Open Heart Surgery;  Laterality: N/A;   COCHLEAR IMPLANT Left 2005?   COLONOSCOPY WITH PROPOFOL N/A 01/20/2018   Procedure: COLONOSCOPY WITH PROPOFOL;  Surgeon: Toledo, Benay Pike, MD;  Location: [facility];  Service: Gastroenterology;  Laterality: N/A;   CORONARY ANGIOPLASTY     CORONARY ARTERY BYPASS GRAFT N/A 04/25/2015   Procedure: CORONARY ARTERY BYPASS GRAFTING (CABG) x ONE, using left internal mammary artery;  Surgeon: Rexene Alberts, MD;  Location: [place];  Service: Open Heart Surgery;  Laterality: N/A;   DILATION AND CURETTAGE OF UTERUS  "several before hysterectomy"   EP IMPLANTABLE DEVICE N/A 05/02/2015   Procedure: Pacemaker Implant;  Surgeon: Thompson Grayer, MD;  Location: [place] [facility];  Service: Cardiovascular;  Laterality: N/A;   ESOPHAGOGASTRODUODENOSCOPY (EGD) WITH PROPOFOL N/A 01/20/2018   Procedure: ESOPHAGOGASTRODUODENOSCOPY (EGD) WITH PROPOFOL;  Surgeon: Toledo, Benay Pike, MD;  Location: [facility];  Service: Gastroenterology;  Laterality: N/A;   EYE SURGERY     I & D EXTREMITY Right 12/06/2015   Procedure: IRRIGATION AND DEBRIDEMENT RIGHT CHEST WALL WITH ACELL PLACEMENT AND VAC;  Surgeon: Loel Lofty Dillingham, DO;  Location: [place];  Service: Plastics;  Laterality: Right;   INCISION AND DRAINAGE OF WOUND N/A 11/21/2015   Procedure:  IRRIGATION AND DEBRIDEMENT WOUND;  Surgeon: Loel Lofty Dillingham, DO;  Location: [place];  Service: Plastics;  Laterality: N/A;   INCISION AND DRAINAGE OF WOUND Right 12/21/2015   Procedure: IRRIGATION AND DEBRIDEMENT right chest wall WOUND;  Surgeon: Loel Lofty Dillingham, DO;  Location: [place];  Service: Plastics;  Laterality: Right;  right chest wall    INCISION AND DRAINAGE OF WOUND Right 01/24/2016   Procedure: IRRIGATION AND DEBRIDEMENT RIGHT CHEST WALL WOUND;  Surgeon: Loel Lofty Dillingham, DO;  Location: [place];  Service: Plastics;  Laterality: Right;   INCISION AND DRAINAGE OF WOUND Right 03/28/2016  Procedure: IRRIGATION AND DEBRIDEMENT RIGHT CHEST WALL WOUND WITH A Cell Placement;  Surgeon: Loel Lofty Dillingham, DO;  Location: [place];  Service: Plastics;  Laterality: Right;   INCISION AND DRAINAGE OF WOUND Right 05/17/2016   Procedure: IRRIGATION AND DEBRIDEMENT OF RIGHT CHEST WOUND WITH A CELL PLACEMENT;  Surgeon: Loel Lofty Dillingham, DO;  Location: [place];  Service: Plastics;  Laterality: Right;   INSERT / REPLACE / REMOVE PACEMAKER     IRRIGATION AND DEBRIDEMENT OF WOUND WITH SPLIT THICKNESS SKIN GRAFT Right 01/11/2016   Procedure: IRRIGATION AND DEBRIDEMENT OF RIGHT CHEST WOUND ;  Surgeon: Loel Lofty Dillingham, DO;  Location: [place];  Service: Plastics;  Laterality: Right;   MASTECTOMY, PARTIAL Right 2002   positive, partial   MAZE N/A 04/25/2015   Procedure: MAZE;  Surgeon: Rexene Alberts, MD;  Location: [place];  Service: Open Heart Surgery;  Laterality: N/A;   MITRAL VALVE REPAIR N/A 04/25/2015   Procedure: MITRAL VALVE  REPLACEMENT using a 27 mm Edwards Perimount Magna Mitral Ease Valve;  Surgeon: Rexene Alberts, MD;  Location: [place];  Service: Open Heart Surgery;  Laterality: N/A;   RIGHT HEART CATH N/A 02/08/2019   Procedure: RIGHT HEART CATH;  Surgeon: Wellington Hampshire, MD;  Location: [place] [facility];  Service: Cardiovascular;  Laterality: N/A;   SKIN SPLIT GRAFT Right 02/12/2016   Procedure:  IRRIGATION AND DEBRIDEMENT RIGHT CHEST WOUND;  Surgeon: Loel Lofty Dillingham, DO;  Location: [place];  Service: Plastics;  Laterality: Right;   STERNAL WIRES REMOVAL N/A 06/05/2015   Procedure: STERNAL WIRES REMOVAL;  Surgeon: Rexene Alberts, MD;  Location: [place];  Service: Thoracic;  Laterality: N/A;   STERNAL WOUND DEBRIDEMENT N/A 05/09/2015   Procedure: STERNAL WOUND DEBRIDEMENT;  Surgeon: Rexene Alberts, MD;  Location: [place];  Service: Thoracic;  Laterality: N/A;   STERNAL WOUND DEBRIDEMENT N/A 11/13/2015   Procedure: Excisional drainage of RIGHT Chest wall mass and breast mass ;  Surgeon: Rexene Alberts, MD;  Location: [place];  Service: Thoracic;  Laterality: N/A;   TEE WITHOUT CARDIOVERSION N/A 04/25/2015   Procedure: TRANSESOPHAGEAL ECHOCARDIOGRAM (TEE);  Surgeon: Rexene Alberts, MD;  Location: [place];  Service: Open Heart Surgery;  Laterality: N/A;   TONSILLECTOMY     TRAM Right 11/18/2015   Procedure: VRAM Vertical Rectus Abdominus Muscle Flap;  Surgeon: Loel Lofty Dillingham, DO;  Location: [place];  Service: Plastics;  Laterality: Right;  RIght Back   TRICUSPID VALVE REPLACEMENT N/A 04/25/2015   Procedure: TRICUSPID VALVE REPAIR;  Surgeon: Rexene Alberts, MD;  Location: [place];  Service: Open Heart Surgery;  Laterality: N/A;   VAGINAL HYSTERECTOMY      Social History   Tobacco Use   Smoking status: Former    Packs/day: 1.50    Years: 40.00    Additional pack years: 0.00    Total pack years: 60.00    Types: Cigarettes    Quit date: 04/20/1998    Years since quitting: 24.6   Smokeless tobacco: Never  Vaping Use   Vaping Use: Never used  Substance Use Topics   Alcohol use: Yes    Comment: 11/10/2015 "glass of wine a few times/year, if that"   Drug use: No     Medication list has been reviewed and updated.  Current Meds  Medication Sig   acetaminophen (TYLENOL) 500 MG tablet Take 1,000 mg by mouth every 6 (six) hours as needed (for pain.).   amiodarone (PACERONE)  200 MG tablet TAKE  1 TABLET BY MOUTH EVERY DAY   bisoprolol (ZEBETA) 5 MG tablet Take 0.5 tablets (2.5 mg total) by mouth daily.   cephALEXin (KEFLEX) 500 MG capsule Take 1 capsule (500 mg total) by mouth 4 (four) times daily for 10 days.   Cyanocobalamin (VITAMIN B-12 PO) Take 1,000 mcg by mouth daily.   diphenhydrAMINE (BENADRYL) 25 mg capsule Take 25 mg by mouth every 6 (six) hours as needed for allergies.   docusate sodium (COLACE) 100 MG capsule Take 100 mg by mouth 2 (two) times daily as needed (constipation.).   Ferrous Sulfate (IRON PO) Take 1 tablet by mouth daily.   furosemide (LASIX) 20 MG tablet TAKE 2 TABLETS (40 MG) IN THE MORNING AND 1 TABLET (20) MG IN THE EVENING.   gabapentin (NEURONTIN) 100 MG capsule TAKE 2 CAPSULES BY MOUTH AT BEDTIME.   ipratropium-albuterol (DUONEB) 0.5-2.5 (3) MG/3ML SOLN Inhale 3 mLs into the lungs every 6 (six) hours as needed.   levothyroxine (SYNTHROID) 88 MCG tablet TAKE 1 TABLET BY MOUTH DAILY BEFORE BREAKFAST.   Multiple Vitamins-Minerals (PRESERVISION AREDS 2 PO) Take 1 tablet by mouth 2 (two) times daily.    mupirocin ointment (BACTROBAN) 2 % Apply 1 Application topically 2 (two) times daily. To skin wound   OXYGEN Inhale 2 L into the lungs continuous.   pantoprazole (PROTONIX) 40 MG tablet Take 1 tablet (40 mg total) by mouth 2 (two) times daily.   sacubitril-valsartan (ENTRESTO) 49-51 MG Take 1 tablet (49/51 mg) by mouth twice daily   simvastatin (ZOCOR) 10 MG tablet TAKE 1 TABLET BY MOUTH EVERYDAY AT BEDTIME   spironolactone (ALDACTONE) 25 MG tablet TAKE 1 TABLET BY MOUTH EVERY DAY   warfarin (COUMADIN) 2.5 MG tablet Take 1/2 a tablet to 1 tablet by mouth daily as directed by the coumadin clinic.       12/10/2022   11:07 AM 11/15/2022    8:02 AM 08/21/2022    7:57 AM 04/01/2022    2:18 PM  GAD 7 : Generalized Anxiety Score  Nervous, Anxious, on Edge 0 0 0 0  Control/stop worrying 0 0 0 0  Worry too much - different things 0 0 0 0  Trouble relaxing 0 0 0 0   Restless 0 0 0 0  Easily annoyed or irritable 0 0 0 0  Afraid - awful might happen 0 0 0 0  Total GAD 7 Score 0 0 0 0  Anxiety Difficulty Not difficult at all Not difficult at all Not difficult at all Not difficult at all       12/10/2022   11:07 AM 11/15/2022    8:02 AM 10/04/2022    8:12 AM  Depression screen PHQ 2/9  Decreased Interest 0 0 0  Down, Depressed, Hopeless 0 0 0  PHQ - 2 Score 0 0 0  Altered sleeping 0 0   Tired, decreased energy 0 0   Change in appetite 0 0   Feeling bad or failure about yourself  0 0   Trouble concentrating 0 0   Moving slowly or fidgety/restless 0 0   Suicidal thoughts 0 0   PHQ-9 Score 0 0   Difficult doing work/chores Not difficult at all Not difficult at all     BP Readings from Last 3 Encounters:  12/10/22 110/60  11/15/22 (!) 120/56  10/24/22 110/60    Physical Exam Vitals and nursing note reviewed.  Constitutional:      General: She is not  in acute distress.    Appearance: She is well-developed.  HENT:     Head: Normocephalic and atraumatic.  Pulmonary:     Effort: Pulmonary effort is normal. No respiratory distress.     Comments: O2 by nasal cannula Skin:    General: Skin is warm and dry.     Findings: Wound present. No rash.          Comments: Shallow irregular 1x0.5 cm ulcer with flat edges and slight central exudate Mild surrounding edema - minimally tender but no warm or red  Neurological:     General: No focal deficit present.     Mental Status: She is alert and oriented to person, place, and time.  Psychiatric:        Mood and Affect: Mood normal.        Behavior: Behavior normal.     Wt Readings from Last 3 Encounters:  12/10/22 112 lb (50.8 kg)  11/15/22 112 lb (50.8 kg)  10/24/22 114 lb 3.2 oz (51.8 kg)    BP 110/60   Pulse 64   Ht 5\' 1"  (1.549 m)   Wt 112 lb (50.8 kg)   SpO2 99%   BMI 21.16 kg/m   Assessment and Plan:  Problem List Items Addressed This Visit       Cardiovascular and  Mediastinum   Aortoiliac occlusive disease (Chronic)     Other   Macular degeneration   Other Visit Diagnoses     Cellulitis of right lower extremity    -  Primary   appears improved - will repeat course of keflex   Relevant Medications   cephALEXin (KEFLEX) 500 MG capsule   Skin tear of right lower leg without complication, sequela       slowly healing - continue local care with TAO and coverage with bandaid as needed       Return if symptoms worsen or fail to improve.   Partially dictated using [place], any errors are not intentional.  Glean Hess, MD [place], [place]

## 2022-12-11 ENCOUNTER — Ambulatory Visit: Payer: Medicare Other | Attending: Cardiovascular Disease

## 2022-12-11 DIAGNOSIS — Z5181 Encounter for therapeutic drug level monitoring: Secondary | ICD-10-CM | POA: Diagnosis present

## 2022-12-11 DIAGNOSIS — Z953 Presence of xenogenic heart valve: Secondary | ICD-10-CM

## 2022-12-11 DIAGNOSIS — I340 Nonrheumatic mitral (valve) insufficiency: Secondary | ICD-10-CM

## 2022-12-11 LAB — POCT INR: INR: 2.8 (ref 2.0–3.0)

## 2022-12-11 NOTE — Patient Instructions (Signed)
Description   Continue 1 tablet daily EXCEPT 0.5 TABLET ON MONDAYS, WEDNESDAYS, and FRIDAYS - Recheck INR in 6 weeks.

## 2022-12-29 ENCOUNTER — Other Ambulatory Visit: Payer: Self-pay | Admitting: Medical

## 2022-12-30 ENCOUNTER — Other Ambulatory Visit: Payer: Self-pay | Admitting: Cardiovascular Disease

## 2022-12-30 NOTE — Telephone Encounter (Signed)
Rx request sent to pharmacy.  

## 2023-01-13 NOTE — Progress Notes (Signed)
Remote pacemaker transmission.   

## 2023-01-16 ENCOUNTER — Other Ambulatory Visit: Payer: Self-pay | Admitting: Cardiovascular Disease

## 2023-01-16 DIAGNOSIS — Z953 Presence of xenogenic heart valve: Secondary | ICD-10-CM

## 2023-01-16 NOTE — Telephone Encounter (Signed)
Prescription refill request received for warfarin Lov: 10/01/22 Fransico Michael)  Next INR check: 01/22/23 Warfarin tablet strength: 2.5mg   Appropriate dose. Refill sent.

## 2023-01-16 NOTE — Telephone Encounter (Signed)
Refill request

## 2023-01-20 ENCOUNTER — Telehealth: Payer: Self-pay | Admitting: Internal Medicine

## 2023-01-20 NOTE — Telephone Encounter (Signed)
Pt needs an appointment.  KP

## 2023-01-20 NOTE — Telephone Encounter (Signed)
Copied from CRM [id number]. Topic: General - Inquiry >> Jan 20, 2023  1:58 PM Runell Gess P wrote: Reason for CRM: pt has a place on her right leg that Dr. Judithann Graves has been treating.  She said she needs another antibiotic for it  CB#  [phone]

## 2023-01-22 ENCOUNTER — Ambulatory Visit: Payer: Medicare Other | Attending: Cardiovascular Disease

## 2023-01-22 DIAGNOSIS — Z953 Presence of xenogenic heart valve: Secondary | ICD-10-CM | POA: Diagnosis present

## 2023-01-22 DIAGNOSIS — I340 Nonrheumatic mitral (valve) insufficiency: Secondary | ICD-10-CM | POA: Insufficient documentation

## 2023-01-22 DIAGNOSIS — Z5181 Encounter for therapeutic drug level monitoring: Secondary | ICD-10-CM | POA: Diagnosis present

## 2023-01-22 LAB — POCT INR: INR: 2.2 (ref 2.0–3.0)

## 2023-01-22 NOTE — Patient Instructions (Signed)
Continue 1 tablet daily EXCEPT 0.5 TABLET ON MONDAYS, WEDNESDAYS, and FRIDAYS - Recheck INR in 6 weeks.

## 2023-01-24 ENCOUNTER — Ambulatory Visit (INDEPENDENT_AMBULATORY_CARE_PROVIDER_SITE_OTHER): Payer: Medicare Other | Admitting: Internal Medicine

## 2023-01-24 ENCOUNTER — Encounter: Payer: Self-pay | Admitting: Internal Medicine

## 2023-01-24 VITALS — BP 110/78 | HR 88 | Ht 61.0 in | Wt 111.0 lb

## 2023-01-24 DIAGNOSIS — L97911 Non-pressure chronic ulcer of unspecified part of right lower leg limited to breakdown of skin: Secondary | ICD-10-CM | POA: Diagnosis not present

## 2023-01-24 DIAGNOSIS — M5432 Sciatica, left side: Secondary | ICD-10-CM | POA: Diagnosis not present

## 2023-01-24 MED ORDER — GABAPENTIN 100 MG PO CAPS: 200.0000 mg | ORAL_CAPSULE | Freq: Every day | ORAL | 1 refills | Status: DC

## 2023-01-24 NOTE — Progress Notes (Signed)
Date:  01/24/2023   Name:  [name]   DOB:  1935/04/19   MRN:  [id number]   Chief Complaint: Recurrent Skin Infections (RLE. Recurrent. Patient wanting more abx. Patient said its better but has a couple of spots that are tender to touch, and wants these checked out.)  Wound Check Treated in ED: 2.5 months ago at home. Previous treatment included oral antibiotics and wound cleansing or irrigation.  Last given Ceftin on 12/10/22.  Lab Results  Component Value Date   NA 142 08/21/2022   K 3.8 08/21/2022   CO2 33 (H) 08/21/2022   GLUCOSE 100 (H) 08/21/2022   BUN 21 08/21/2022   CREATININE 1.07 (H) 08/21/2022   CALCIUM 9.3 08/21/2022   EGFR 51 (L) 08/21/2022   GFRNONAA 55 (L) 03/09/2021   Lab Results  Component Value Date   CHOL 153 08/21/2021   HDL 46 08/21/2021   LDLCALC 92 08/21/2021   TRIG 80 08/21/2021   CHOLHDL 3.3 08/21/2021   Lab Results  Component Value Date   TSH 0.958 08/21/2022   Lab Results  Component Value Date   HGBA1C 5.4 08/21/2022   Lab Results  Component Value Date   WBC 6.7 08/21/2022   HGB 11.8 08/21/2022   HCT 35.3 08/21/2022   MCV 100 (H) 08/21/2022   PLT 141 (L) 08/21/2022   Lab Results  Component Value Date   ALT 19 08/21/2022   AST 18 08/21/2022   ALKPHOS 70 08/21/2022   BILITOT 0.3 08/21/2022   No results found for: "25OHVITD2", "25OHVITD3", "VD25OH"   Review of Systems  Patient Active Problem List   Diagnosis Date Noted   Non-healing ulcer of lower leg, right, limited to breakdown of skin (HCC) 01/24/2023   Macular degeneration 12/10/2022   Chronic respiratory failure with hypoxia (HCC) 04/08/2022   Gastroesophageal reflux disease 02/19/2022   Sciatica of left side 08/21/2021   Aortoiliac occlusive disease (HCC) 05/22/2021   Cochlear implant in place 05/22/2021   Acquired thrombophilia (HCC) 08/11/2020   Stage 3a chronic kidney disease (CKD) (HCC) 08/11/2020   Primary insomnia 07/31/2019   Osteoporosis, postmenopausal  01/23/2018   Coronary artery disease involving native coronary artery of native heart with angina pectoris (HCC) 01/13/2017   S/P placement of cardiac pacemaker 05/02/15, medtronic 05/03/2015   Bradycardia 05/03/2015   Presence of cardiac pacemaker 05/03/2015   S/P mitral valve replacement with bioprosthetic valve, tricuspid valve repair, maze procedure and CABG x1 04/25/2015   S/P tricuspid valve repair 04/25/2015   S/P Maze operation for atrial fibrillation 04/25/2015   S/P CABG x 1 04/25/2015   Pulmonary hypertension (HCC)    Tricuspid regurgitation    Chronic combined systolic and diastolic CHF (congestive heart failure) (HCC)    Chronic systolic heart failure (HCC) 11/10/2014   Essential hypertension 11/10/2014   Mitral regurgitation 11/10/2014   Hyperlipidemia 11/10/2014   Atrial fibrillation (HCC) 11/10/2014   Prediabetes 05/08/2014   Auditory vertigo 05/08/2014   Adult hypothyroidism 05/08/2014   H/O adenomatous polyp of colon 05/08/2014   Stage 4 very severe COPD by GOLD classification (HCC) 03/28/2014    Allergies  Allergen Reactions   Ace Inhibitors Cough and Other (See Comments)   Amoxicillin-Pot Clavulanate Swelling and Other (See Comments)    SWOLLEN JOINTS  Has patient had a PCN reaction causing immediate rash, facial/tongue/throat swelling, SOB or lightheadedness with hypotension: Yes Has patient had a PCN reaction causing severe rash involving mucus membranes or skin necrosis: No Has patient had  a PCN reaction that required hospitalization No Has patient had a PCN reaction occurring within the last 10 years: No If all of the above answers are "NO", then may proceed with Cephalosporin   Penicillins Other (See Comments) and Swelling    SWOLLEN JOINTS   Has patient had a PCN reaction causing immediate rash, facial/tongue/throat swelling, SOB or lightheadedness with hypotension: Yes  Has patient had a PCN reaction causing severe rash involving mucus membranes or  skin necrosis: No  Has patient had a PCN reaction that required hospitalization No  Has patient had a PCN reaction occurring within the last 10 years: No  If all of the above answers are "NO", then may proceed with Cephalosporin use.  Swollen joints  Swollen joints, Has patient had a PCN reaction causing immediate rash, facial/tongue/throat swelling, SOB or lightheadedness with hypotension: Yes, Has patient had a PCN reaction causing severe rash involving mucus membranes or skin necrosis: No, Has patient had a PCN reaction that required hospitalization No, Has patient had a PCN reaction occurring within the last 10 years: No, If all of the above answers are "NO", then may proceed with Cephalosporin use.   Rosuvastatin Other (See Comments)   Nifedipine Other (See Comments)    UNSPECIFIED   Propranolol Other (See Comments)    UNSPECIFIED   Diazepam Other (See Comments)    Made her "hyper"    Meperidine Other (See Comments)    Made her "hyper"     Propoxyphene Other (See Comments)    Made her "hyper"      Past Surgical History:  Procedure Laterality Date   APPENDECTOMY     APPLICATION OF A-CELL OF CHEST/ABDOMEN N/A 11/21/2015   Procedure: APPLICATION OF A-CELL OF CHEST/ABDOMEN;  Surgeon: Alena Bills Dillingham, DO;  [facility];  Service: Plastics;  Laterality: N/A;   APPLICATION OF A-CELL OF CHEST/ABDOMEN Right 12/21/2015   Procedure: APPLICATION OF A-CELL OF RIGHT CHEST;  Surgeon: Alena Bills Dillingham, DO;  [facility]: [facility];  Service: Plastics;  Laterality: Right;   APPLICATION OF A-CELL OF CHEST/ABDOMEN Right 01/11/2016   Procedure: APPLICATION OF A-CELL OF RIGHT CHEST;  Surgeon: Alena Bills Dillingham, DO;  [facility];  Service: Plastics;  Laterality: Right;   APPLICATION OF A-CELL OF CHEST/ABDOMEN Right 02/12/2016   Procedure: APPLICATION OF A-CELL TO RIGHT CHEST WOUND;  Surgeon: Alena Bills Dillingham, DO;  [facility];  Service: Plastics;  Laterality: Right;   APPLICATION OF  WOUND VAC N/A 05/09/2015   Procedure: APPLICATION OF WOUND VAC;  Surgeon: Purcell Nails, MD;  [facility];  Service: Thoracic;  Laterality: N/A;   APPLICATION OF WOUND VAC N/A 11/13/2015   Procedure: APPLICATION OF WOUND VAC;  Surgeon: Purcell Nails, MD;  [facility];  Service: Thoracic;  Laterality: N/A;   APPLICATION OF WOUND VAC N/A 11/21/2015   Procedure: APPLICATION OF WOUND VAC;  Surgeon: Alena Bills Dillingham, DO;  [facility];  Service: Plastics;  Laterality: N/A;   APPLICATION OF WOUND VAC Right 12/21/2015   Procedure: APPLICATION OF WOUND VAC to right chest wall ;  Surgeon: Alena Bills Dillingham, DO;  [facility];  Service: Plastics;  Laterality: Right;   APPLICATION OF WOUND VAC Right 01/11/2016   Procedure: APPLICATION OF WOUND VAC RIGHT CHEST ;  Surgeon: Alena Bills Dillingham, DO;  [facility];  Service: Plastics;  Laterality: Right;   APPLICATION OF WOUND VAC Right 01/24/2016   Procedure: APPLICATION OF WOUND VAC RIGHT CHEST WALL;  Surgeon: Alena Bills Dillingham, DO;  [facility];  Service: Plastics;  Laterality: Right;   APPLICATION OF WOUND VAC Right 02/12/2016   Procedure: RE-APPLICATION OF WOUND VAC TO RIGHT CHEST WOUND;  Surgeon: Alena Bills Dillingham, DO;  [facility];  Service: Plastics;  Laterality: Right;   BIV UPGRADE N/A 06/07/2019   Procedure: BIV UPGRADE;  Surgeon: Duke Salvia, MD;  Location: [facility] INVASIVE CV LAB;  Service: Cardiovascular;  Laterality: N/A;   BREAST BIOPSY Left 11/25/06   neg   BREAST BIOPSY Left 01/20/12   /clip-neg   CARDIAC CATHETERIZATION  11/2013   [facility]   CARDIAC CATHETERIZATION  10/2014   [facility]   CARDIAC VALVE REPLACEMENT     CARDIOVERSION N/A 07/20/2018   Procedure: CARDIOVERSION;  Surgeon: Iran Ouch, MD;  Location: [facility] ORS;  Service: Cardiovascular;  Laterality: N/A;   CARDIOVERSION N/A 09/18/2020   Procedure: CARDIOVERSION;  Surgeon: Antonieta Iba, MD;  Location: [facility];  Service: Cardiovascular;  Laterality: N/A;    CATARACT EXTRACTION W/ INTRAOCULAR LENS  IMPLANT, BILATERAL Bilateral 2014   CLIPPING OF ATRIAL APPENDAGE N/A 04/25/2015   Procedure: CLIPPING OF ATRIAL APPENDAGE;  Surgeon: Purcell Nails, MD;  [facility];  Service: Open Heart Surgery;  Laterality: N/A;   COCHLEAR IMPLANT Left 2005?   COLONOSCOPY WITH PROPOFOL N/A 01/20/2018   Procedure: COLONOSCOPY WITH PROPOFOL;  Surgeon: Toledo, Boykin Nearing, MD;  Location: [facility];  Service: Gastroenterology;  Laterality: N/A;   CORONARY ANGIOPLASTY     CORONARY ARTERY BYPASS GRAFT N/A 04/25/2015   Procedure: CORONARY ARTERY BYPASS GRAFTING (CABG) x ONE, using left internal mammary artery;  Surgeon: Purcell Nails, MD;  [facility];  Service: Open Heart Surgery;  Laterality: N/A;   DILATION AND CURETTAGE OF UTERUS  "several before hysterectomy"   EP IMPLANTABLE DEVICE N/A 05/02/2015   Procedure: Pacemaker Implant;  Surgeon: Hillis Range, MD;  [facility];  Service: Cardiovascular;  Laterality: N/A;   ESOPHAGOGASTRODUODENOSCOPY (EGD) WITH PROPOFOL N/A 01/20/2018   Procedure: ESOPHAGOGASTRODUODENOSCOPY (EGD) WITH PROPOFOL;  Surgeon: Toledo, Boykin Nearing, MD;  Location: [facility];  Service: Gastroenterology;  Laterality: N/A;   EYE SURGERY     I & D EXTREMITY Right 12/06/2015   Procedure: IRRIGATION AND DEBRIDEMENT RIGHT CHEST WALL WITH ACELL PLACEMENT AND VAC;  Surgeon: Alena Bills Dillingham, DO;  [facility];  Service: Plastics;  Laterality: Right;   INCISION AND DRAINAGE OF WOUND N/A 11/21/2015   Procedure: IRRIGATION AND DEBRIDEMENT WOUND;  Surgeon: Alena Bills Dillingham, DO;  [facility];  Service: Plastics;  Laterality: N/A;   INCISION AND DRAINAGE OF WOUND Right 12/21/2015   Procedure: IRRIGATION AND DEBRIDEMENT right chest wall WOUND;  Surgeon: Alena Bills Dillingham, DO;  [facility];  Service: Plastics;  Laterality: Right;  right chest wall    INCISION AND DRAINAGE OF WOUND Right 01/24/2016   Procedure: IRRIGATION  AND DEBRIDEMENT RIGHT CHEST WALL WOUND;  Surgeon: Alena Bills Dillingham, DO;  [facility];  Service: Plastics;  Laterality: Right;   INCISION AND DRAINAGE OF WOUND Right 03/28/2016   Procedure: IRRIGATION AND DEBRIDEMENT RIGHT CHEST WALL WOUND WITH A Cell Placement;  Surgeon: Alena Bills Dillingham, DO;  [facility];  Service: Plastics;  Laterality: Right;   INCISION AND DRAINAGE OF WOUND Right 05/17/2016   Procedure: IRRIGATION AND DEBRIDEMENT OF RIGHT CHEST WOUND WITH A CELL PLACEMENT;  Surgeon: Alena Bills Dillingham, DO;  [facility];  Service: Plastics;  Laterality:  Right;   INSERT / REPLACE / REMOVE PACEMAKER     IRRIGATION AND DEBRIDEMENT OF WOUND WITH SPLIT THICKNESS SKIN GRAFT Right 01/11/2016   Procedure: IRRIGATION AND DEBRIDEMENT OF RIGHT CHEST WOUND ;  Surgeon: Alena Bills Dillingham, DO;  [facility];  Service: Plastics;  Laterality: Right;   MASTECTOMY, PARTIAL Right 2002   positive, partial   MAZE N/A 04/25/2015   Procedure: MAZE;  Surgeon: Purcell Nails, MD;  Location: [facility] OR;  Service: Open Heart Surgery;  Laterality: N/A;   MITRAL VALVE REPAIR N/A 04/25/2015   Procedure: MITRAL VALVE  REPLACEMENT using a 27 mm Edwards Perimount Magna Mitral Ease Valve;  Surgeon: Purcell Nails, MD;  [facility];  Service: Open Heart Surgery;  Laterality: N/A;   RIGHT HEART CATH N/A 02/08/2019   Procedure: RIGHT HEART CATH;  Surgeon: Iran Ouch, MD;  Location: [facility] INVASIVE CV LAB;  Service: Cardiovascular;  Laterality: N/A;   SKIN SPLIT GRAFT Right 02/12/2016   Procedure: IRRIGATION AND DEBRIDEMENT RIGHT CHEST WOUND;  Surgeon: Alena Bills Dillingham, DO;  [facility];  Service: Plastics;  Laterality: Right;   STERNAL WIRES REMOVAL N/A 06/05/2015   Procedure: STERNAL WIRES REMOVAL;  Surgeon: Purcell Nails, MD;  [facility];  Service: Thoracic;  Laterality: N/A;   STERNAL WOUND DEBRIDEMENT N/A 05/09/2015   Procedure: STERNAL WOUND DEBRIDEMENT;  Surgeon: Purcell Nails, MD;   [facility];  Service: Thoracic;  Laterality: N/A;   STERNAL WOUND DEBRIDEMENT N/A 11/13/2015   Procedure: Excisional drainage of RIGHT Chest wall mass and breast mass ;  Surgeon: Purcell Nails, MD;  [facility];  Service: Thoracic;  Laterality: N/A;   TEE WITHOUT CARDIOVERSION N/A 04/25/2015   Procedure: TRANSESOPHAGEAL ECHOCARDIOGRAM (TEE);  Surgeon: Purcell Nails, MD;  Location: [facility] OR;  Service: Open Heart Surgery;  Laterality: N/A;   TONSILLECTOMY     TRAM Right 11/18/2015   Procedure: VRAM Vertical Rectus Abdominus Muscle Flap;  Surgeon: Alena Bills Dillingham, DO;  [facility];  Service: Plastics;  Laterality: Right;  RIght Back   TRICUSPID VALVE REPLACEMENT N/A 04/25/2015   Procedure: TRICUSPID VALVE REPAIR;  Surgeon: Purcell Nails, MD;  [facility];  Service: Open Heart Surgery;  Laterality: N/A;   VAGINAL HYSTERECTOMY      Social History   Tobacco Use   Smoking status: Former    Packs/day: 1.50    Years: 40.00    Additional pack years: 0.00    Total pack years: 60.00    Types: Cigarettes    Quit date: 04/20/1998    Years since quitting: 24.7   Smokeless tobacco: Never  Vaping Use   Vaping Use: Never used  Substance Use Topics   Alcohol use: Yes    Comment: 11/10/2015 "glass of wine a few times/year, if that"   Drug use: No     Medication list has been reviewed and updated.  Current Meds  Medication Sig   acetaminophen (TYLENOL) 500 MG tablet Take 1,000 mg by mouth every 6 (six) hours as needed (for pain.).   amiodarone (PACERONE) 200 MG tablet TAKE 1 TABLET BY MOUTH EVERY DAY   bisoprolol (ZEBETA) 5 MG tablet Take 0.5 tablets (2.5 mg total) by mouth daily.   Cyanocobalamin (VITAMIN B-12 PO) Take 1,000 mcg by mouth daily.   diphenhydrAMINE (BENADRYL) 25 mg capsule Take 25 mg by mouth every 6 (six) hours as needed for allergies.   docusate sodium (COLACE) 100 MG capsule  Take 100 mg by mouth 2 (two) times daily as needed (constipation.).   Ferrous Sulfate  (IRON PO) Take 1 tablet by mouth daily.   furosemide (LASIX) 20 MG tablet TAKE 2 TABLETS (40 MG) IN THE MORNING AND 1 TABLET (20) MG IN THE EVENING.   ipratropium-albuterol (DUONEB) 0.5-2.5 (3) MG/3ML SOLN Inhale 3 mLs into the lungs every 6 (six) hours as needed.   levothyroxine (SYNTHROID) 88 MCG tablet TAKE 1 TABLET BY MOUTH DAILY BEFORE BREAKFAST.   Multiple Vitamins-Minerals (PRESERVISION AREDS 2 PO) Take 1 tablet by mouth 2 (two) times daily.    mupirocin ointment (BACTROBAN) 2 % Apply 1 Application topically 2 (two) times daily. To skin wound   OXYGEN Inhale 2 L into the lungs continuous.   pantoprazole (PROTONIX) 40 MG tablet Take 1 tablet (40 mg total) by mouth 2 (two) times daily.   sacubitril-valsartan (ENTRESTO) 49-51 MG Take 1 tablet (49/51 mg) by mouth twice daily   simvastatin (ZOCOR) 10 MG tablet TAKE 1 TABLET BY MOUTH EVERYDAY AT BEDTIME   spironolactone (ALDACTONE) 25 MG tablet TAKE 1 TABLET BY MOUTH EVERY DAY   warfarin (COUMADIN) 2.5 MG tablet TAKE 1/2 A TABLET TO 1 TABLET BY MOUTH DAILY AS DIRECTED BY THE COUMADIN CLINIC.   [DISCONTINUED] gabapentin (NEURONTIN) 100 MG capsule TAKE 2 CAPSULES BY MOUTH AT BEDTIME.       01/24/2023    3:33 PM 12/10/2022   11:07 AM 11/15/2022    8:02 AM 08/21/2022    7:57 AM  GAD 7 : Generalized Anxiety Score  Nervous, Anxious, on Edge 0 0 0 0  Control/stop worrying 0 0 0 0  Worry too much - different things 0 0 0 0  Trouble relaxing 0 0 0 0  Restless 0 0 0 0  Easily annoyed or irritable 0 0 0 0  Afraid - awful might happen 0 0 0 0  Total GAD 7 Score 0 0 0 0  Anxiety Difficulty Not difficult at all Not difficult at all Not difficult at all Not difficult at all       01/24/2023    3:33 PM 12/10/2022   11:07 AM 11/15/2022    8:02 AM  Depression screen PHQ 2/9  Decreased Interest 0 0 0  Down, Depressed, Hopeless 0 0 0  PHQ - 2 Score 0 0 0  Altered sleeping 0 0 0  Tired, decreased energy 0 0 0  Change in appetite 0 0 0  Feeling bad or  failure about yourself  0 0 0  Trouble concentrating 0 0 0  Moving slowly or fidgety/restless 0 0 0  Suicidal thoughts 0 0 0  PHQ-9 Score 0 0 0  Difficult doing work/chores Not difficult at all Not difficult at all Not difficult at all    BP Readings from Last 3 Encounters:  01/24/23 110/78  12/10/22 110/60  11/15/22 (!) 120/56    Physical Exam Constitutional:      Appearance: Normal appearance.  Cardiovascular:     Rate and Rhythm: Normal rate and regular rhythm.  Pulmonary:     Effort: Pulmonary effort is normal.     Comments: Oxygen by [place] Skin:    General: Skin is warm.     Findings: Lesion present.  Neurological:     General: No focal deficit present.     Mental Status: She is alert.     Wt Readings from Last 3 Encounters:  01/24/23 111 lb (50.3 kg)  12/10/22 112 lb (50.8 kg)  11/15/22 112 lb (50.8 kg)    BP 110/78   Pulse 88   Ht 5\' 1"  (1.549 m)   Wt 111 lb (50.3 kg)   SpO2 90%   BMI 20.97 kg/m   Assessment and Plan:  Problem List Items Addressed This Visit     Sciatica of left side   Relevant Medications   gabapentin (NEURONTIN) 100 MG capsule   Non-healing ulcer of lower leg, right, limited to breakdown of skin (HCC) - Primary    Size of the wound is getting larger with slough No evidence of bacterial infection Will refer to wound clinic Continue local care with Vaseline and bandaid.      Relevant Orders   Ambulatory referral to Wound Clinic    No follow-ups on file.   Partially dictated using Dragon software, any errors are not intentional.  Reubin Milan, MD [facility] [place], [place]

## 2023-01-24 NOTE — Assessment & Plan Note (Signed)
Size of the wound is getting larger with slough No evidence of bacterial infection Will refer to wound clinic Continue local care with Vaseline and bandaid.

## 2023-02-04 ENCOUNTER — Encounter: Payer: Medicare Other | Attending: Physician Assistant | Admitting: Physician Assistant

## 2023-02-04 DIAGNOSIS — L97812 Non-pressure chronic ulcer of other part of right lower leg with fat layer exposed: Secondary | ICD-10-CM | POA: Diagnosis not present

## 2023-02-04 DIAGNOSIS — I5042 Chronic combined systolic (congestive) and diastolic (congestive) heart failure: Secondary | ICD-10-CM | POA: Insufficient documentation

## 2023-02-04 DIAGNOSIS — S81811A Laceration without foreign body, right lower leg, initial encounter: Secondary | ICD-10-CM | POA: Insufficient documentation

## 2023-02-04 DIAGNOSIS — I87331 Chronic venous hypertension (idiopathic) with ulcer and inflammation of right lower extremity: Secondary | ICD-10-CM | POA: Insufficient documentation

## 2023-02-04 DIAGNOSIS — J449 Chronic obstructive pulmonary disease, unspecified: Secondary | ICD-10-CM | POA: Diagnosis not present

## 2023-02-04 DIAGNOSIS — I480 Paroxysmal atrial fibrillation: Secondary | ICD-10-CM | POA: Diagnosis not present

## 2023-02-04 DIAGNOSIS — Z7901 Long term (current) use of anticoagulants: Secondary | ICD-10-CM | POA: Diagnosis not present

## 2023-02-04 DIAGNOSIS — N183 Chronic kidney disease, stage 3 unspecified: Secondary | ICD-10-CM | POA: Insufficient documentation

## 2023-02-04 DIAGNOSIS — X58XXXA Exposure to other specified factors, initial encounter: Secondary | ICD-10-CM | POA: Diagnosis not present

## 2023-02-04 DIAGNOSIS — I130 Hypertensive heart and chronic kidney disease with heart failure and stage 1 through stage 4 chronic kidney disease, or unspecified chronic kidney disease: Secondary | ICD-10-CM | POA: Insufficient documentation

## 2023-02-04 DIAGNOSIS — I2510 Atherosclerotic heart disease of native coronary artery without angina pectoris: Secondary | ICD-10-CM | POA: Diagnosis not present

## 2023-02-05 NOTE — Progress Notes (Signed)
[name] ([id number]) [id number] Page 1 of 4 Visit Report for 02/04/2023 Abuse Risk Screen Details Patient Name: Date of Service: [name], [name] 02/04/2023 8:15 A M Medical Record Number: [id number] Patient Account Number: [id number] Date of Birth/Sex: Treating RN: Nov 16, 1934 (88 y.o. [name] Primary Care [name]: Bari Edward Other Clinician: Referring [name]: Treating [name]/Extender: [name] in Treatment: 0 Abuse Risk Screen Items Answer ABUSE RISK SCREEN: Has anyone close to you tried to hurt or harm you recentlyo No Do you feel uncomfortable with anyone in your familyo No Has anyone forced you do things that you didnt want to doo No Electronic Signature(s) Signed: 02/04/2023 4:52:49 PM By: [name] Entered By: [name] on 02/04/2023 08:26:03 -------------------------------------------------------------------------------- Activities of Daily Living Details Patient Name: Date of Service: [name], [name] 02/04/2023 8:15 A M Medical Record Number: [id number] Patient Account Number: [id number] Date of Birth/Sex: Treating RN: 01-04-1935 (88 y.o. [name] Primary Care [name]: Bari Edward Other Clinician: Referring Kahmya Pinkham: Treating [name]/Extender: [name] in Treatment: 0 Activities of Daily Living Items Answer Activities of Daily Living (Please select one for each item) Drive Automobile Not Able T Medications ake Completely Able Use T elephone Completely Able Care for Appearance Completely Able Use T oilet Completely Able Bath / Shower Completely Able Dress Self Completely Able Feed Self Completely Able Walk Need Assistance Get In / Out Bed Need Assistance Housework Need Assistance Prepare Meals Completely Able Handle Money Completely Able Shop for Self Completely Able Electronic Signature(s) Signed: 02/04/2023 4:52:49 PM By:  [name] Entered By: [name] on 02/04/2023 08:26:41 -------------------------------------------------------------------------------- Education Screening Details Patient Name: Date of Service: [name] 02/04/2023 8:15 A M Medical Record Number: [id number] Patient Account Number: [id number] Date of Birth/Sex: Treating RN: [birth year] (88 y.o. [name] Primary Care [name]: Bari Edward Other Clinician: Referring [name]: Treating [name]/Extender: [name] in Treatment: 0 [name], [name] ([id number]) [id number] Page 2 of 4 Primary Learner Assessed: Patient Learning Preferences/Education Level/Primary Language Learning Preference: Explanation, Demonstration, Video, Communication Board, Printed Material Preferred Language: Economist Language Barrier: No Translator Needed: No Memory Deficit: No Emotional Barrier: No Cultural/Religious Beliefs Affecting Medical Care: No Physical Barrier Impaired Vision: Yes Glasses Impaired Hearing: Yes only can hear out of left ear Knowledge/Comprehension Knowledge Level: High Comprehension Level: High Ability to understand written instructions: High Ability to understand verbal instructions: High Motivation Anxiety Level: Calm Cooperation: Cooperative Education Importance: Acknowledges Need Interest in Health Problems: Asks Questions Perception: Coherent Willingness to Engage in Self-Management High Activities: Readiness to Engage in Self-Management High Activities: Electronic Signature(s) Signed: 02/04/2023 4:52:49 PM By: [name] Entered By: [name] on 02/04/2023 08:27:06 -------------------------------------------------------------------------------- Fall Risk Assessment Details Patient Name: Date of Service: [name] 02/04/2023 8:15 A M Medical Record Number: [id number] Patient Account Number:  [id number] Date of Birth/Sex: Treating RN: [birth year] (88 y.o. [name] Primary Care [name]: Bari Edward Other Clinician: Referring [name]: Treating [name]/Extender: [name] in Treatment: 0 Fall Risk Assessment Items Have you had 2 or more falls in the last 12 monthso 0 No Have you had any fall that resulted in injury in the last 12 monthso 0 No FALLS RISK SCREEN History of falling - immediate or within 3 months 0 No Secondary diagnosis (Do you have 2 or more medical diagnoseso) 0 No Ambulatory aid None/bed rest/wheelchair/nurse 0 Yes Crutches/cane/walker 0 No Furniture 0 No Intravenous  therapy Access/Saline/Heparin Lock 0 No Gait/Transferring Normal/ bed rest/ wheelchair 0 Yes Weak (short steps with or without shuffle, stooped but able to lift head while walking, may seek 0 No support from furniture) Impaired (short steps with shuffle, may have difficulty arising from chair, head down, impaired 0 No balance) Mental Status Oriented to own ability [id number]BRITTINIE, [name] ([id number]) [id number].pdf Page 3 of 4 Signed: 02/04/2023 4:52:49 PM By: [name] Entered By: [name] on 02/04/2023 08:27:18 -------------------------------------------------------------------------------- Foot Assessment Details Patient Name: Date of Service: [name], Jamie RA [name]. 02/04/2023 8:15 A M Medical Record Number: [id number] Patient Account Number: [id number] Date of Birth/Sex: Treating RN: 02-13-1935 (88 y.o. [name] Primary Care [name]: Bari Edward Other Clinician: Referring [name]: Treating [name]/Extender: [name] in Treatment: 0 Foot Assessment Items Site Locations + = Sensation present, - = Sensation absent, C = Callus, U = Ulcer [name] = Redness, W = Warmth, M = Maceration, PU = Pre-ulcerative lesion F = Fissure, S = Swelling, D =  Dryness Assessment Right: Left: Other Deformity: No No Prior Foot Ulcer: No No Prior Amputation: No No Charcot Joint: No No Ambulatory Status: Ambulatory Without Help Gait: Steady Electronic Signature(s) Signed: 02/04/2023 4:52:49 PM By: [name] Entered By: [name] on 02/04/2023 08:39:42 -------------------------------------------------------------------------------- Nutrition Risk Screening Details Patient Name: Date of Service: [name], Jamie RA [name]. 02/04/2023 8:15 A M Medical Record Number: [id number] Patient Account Number: [id number] Date of Birth/Sex: Treating RN: 1935/06/02 (88 y.o. [name] Primary Care Erisa Mehlman: Bari Edward Other Clinician: Referring Amarianna Abplanalp: Treating Sakina Briones/Extender: [name] in Treatment: 0 Height (in): 61 Weight (lbs): 108 Body Mass Index (BMI): 20.4 [name], [name] ([id number]) [phone] [id number].pdf Page 4 of 4 Nutrition Risk Screening Items Score Screening NUTRITION RISK SCREEN: I have an illness or condition that made me change the kind and/or amount of food I eat 0 No I eat fewer than two meals per day 0 No I eat few fruits and vegetables, or milk products 0 No I have three or more drinks of beer, liquor or wine almost every day 0 No I have tooth or mouth problems that make it hard for me to eat 0 No I don't always have enough money to buy the food I need 0 No I eat alone most of the time 0 No I take three or more different prescribed or over-the-counter drugs a day 0 No Without wanting to, I have lost or gained 10 pounds in the last six months 0 No I am not always physically able to shop, cook and/or feed myself 0 No Nutrition Protocols Good Risk Protocol 0 No interventions needed Moderate Risk Protocol High Risk Proctocol Risk Level: Good Risk Score: 0 Electronic Signature(s) Signed: 02/04/2023 4:52:49 PM By: [name] Entered By: [name] on 02/04/2023  08:27:39

## 2023-02-06 ENCOUNTER — Other Ambulatory Visit (INDEPENDENT_AMBULATORY_CARE_PROVIDER_SITE_OTHER): Payer: Self-pay | Admitting: Physician Assistant

## 2023-02-06 DIAGNOSIS — L97812 Non-pressure chronic ulcer of other part of right lower leg with fat layer exposed: Secondary | ICD-10-CM

## 2023-02-11 ENCOUNTER — Encounter: Payer: Medicare Other | Attending: Physician Assistant | Admitting: Physician Assistant

## 2023-02-11 DIAGNOSIS — I87331 Chronic venous hypertension (idiopathic) with ulcer and inflammation of right lower extremity: Secondary | ICD-10-CM | POA: Insufficient documentation

## 2023-02-11 DIAGNOSIS — I5042 Chronic combined systolic (congestive) and diastolic (congestive) heart failure: Secondary | ICD-10-CM | POA: Diagnosis not present

## 2023-02-11 DIAGNOSIS — J449 Chronic obstructive pulmonary disease, unspecified: Secondary | ICD-10-CM | POA: Insufficient documentation

## 2023-02-11 DIAGNOSIS — I130 Hypertensive heart and chronic kidney disease with heart failure and stage 1 through stage 4 chronic kidney disease, or unspecified chronic kidney disease: Secondary | ICD-10-CM | POA: Diagnosis not present

## 2023-02-11 DIAGNOSIS — I480 Paroxysmal atrial fibrillation: Secondary | ICD-10-CM | POA: Insufficient documentation

## 2023-02-11 DIAGNOSIS — I110 Hypertensive heart disease with heart failure: Secondary | ICD-10-CM | POA: Insufficient documentation

## 2023-02-11 DIAGNOSIS — N183 Chronic kidney disease, stage 3 unspecified: Secondary | ICD-10-CM | POA: Insufficient documentation

## 2023-02-11 DIAGNOSIS — I2510 Atherosclerotic heart disease of native coronary artery without angina pectoris: Secondary | ICD-10-CM | POA: Diagnosis not present

## 2023-02-11 DIAGNOSIS — Z7901 Long term (current) use of anticoagulants: Secondary | ICD-10-CM | POA: Insufficient documentation

## 2023-02-11 DIAGNOSIS — L97812 Non-pressure chronic ulcer of other part of right lower leg with fat layer exposed: Secondary | ICD-10-CM | POA: Insufficient documentation

## 2023-02-11 DIAGNOSIS — S81811A Laceration without foreign body, right lower leg, initial encounter: Secondary | ICD-10-CM | POA: Diagnosis present

## 2023-02-13 ENCOUNTER — Ambulatory Visit (INDEPENDENT_AMBULATORY_CARE_PROVIDER_SITE_OTHER): Payer: Medicare Other

## 2023-02-13 DIAGNOSIS — L97812 Non-pressure chronic ulcer of other part of right lower leg with fat layer exposed: Secondary | ICD-10-CM

## 2023-02-18 ENCOUNTER — Encounter: Payer: Medicare Other | Admitting: Physician Assistant

## 2023-02-18 DIAGNOSIS — S81811A Laceration without foreign body, right lower leg, initial encounter: Secondary | ICD-10-CM | POA: Diagnosis not present

## 2023-02-18 NOTE — Progress Notes (Signed)
[name] [name] [name] ([id number]) [id number] Page 1 of 7 Visit Report for 02/18/2023 Chief Complaint Document Details Patient Name: Date of Service: [name], [name] [name]. 02/18/2023 9:45 A M Medical Record Number: [id number] Patient Account Number: [id number] Date of Birth/Sex: Treating RN: [birth year] (88 y.o. [name] [name] [name] Primary Care Provider: Bari [name] Other Clinician: Referring Provider: Treating Provider/Extender: [name] [name] [name] in Treatment: 2 Information Obtained from: Patient Chief Complaint Right LE Ulcer Electronic Signature(s) Signed: 02/18/2023 9:49:41 AM By: Jamie Derry PA-C Entered By: [name] [name] [name] on 02/18/2023 09:49:40 -------------------------------------------------------------------------------- Debridement Details Patient Name: Date of Service: [name] [name] [name] 02/18/2023 9:45 A M Medical Record Number: [id number] Patient Account Number: [id number] Date of Birth/Sex: Treating RN: May 25, 1935 (88 y.o. [name] [name] [name] Primary Care Provider: Bari [name] Other Clinician: Referring Provider: Treating Provider/Extender: [name] [name] [name] in Treatment: 2 Debridement Performed for Assessment: Wound #1 Right,Lateral Lower Leg Performed By: Physician Jamie Derry, PA-C Debridement Type: Debridement Level of Consciousness (Pre-procedure): Awake and Alert Pre-procedure Verification/Time Out Yes - 10:13 Taken: Pain Control: Lidocaine 4% T opical Solution Percent of Wound Bed Debrided: 100% T Area Debrided (cm): otal 4.52 Tissue and other material debrided: Viable, Non-Viable, Slough, Subcutaneous, Biofilm, Slough Level: Skin/Subcutaneous Tissue Debridement Description: Excisional Instrument: Curette Bleeding: Moderate Hemostasis Achieved: Pressure Response to Treatment: Procedure was tolerated well Level of Consciousness (Post- Awake and Alert procedure): Post Debridement Measurements of Total  Wound Length: (cm) 3.2 Width: (cm) 1.8 Depth: (cm) 0.1 Volume: (cm) 0.452 Character of Wound/Ulcer Post Debridement: Stable Post Procedure Diagnosis Same as Pre-procedure Electronic Signature(s) Unsigned Entered By: Angelina Pih on 02/18/2023 10:14:46 [name] [name] [name] ([id number]) [id number] Page 2 of 7 -------------------------------------------------------------------------------- HPI Details Patient Name: Date of Service: [name], [name] 02/18/2023 9:45 A M Medical Record Number: [id number] Patient Account Number: [id number] Date of Birth/Sex: Treating RN: 13-Mar-1935 (88 y.o. [name] [name] [name] Primary Care Provider: Bari [name] Other Clinician: Referring Provider: Treating Provider/Extender: [name] [name] [name] in Treatment: 2 History of Present Illness HPI Description: 02-04-2023 patient presents today for initial evaluation here in our clinic concerning a wound which actually began around October 24, 2022. With that being said she notes that this was due to an injury and then has continued to be a problem not wanting to close and heal as quickly as would be expected. She is on long-term anticoagulant therapy due to atrial fibrillation. She also has hypertension, COPD, coronary artery disease, congestive heart failure, and chronic venous insufficiency bilaterally. With that being said I do believe that this wound is currently showing some signs of improvement I do not see any obvious evidence of infection right now though that something we will keep a close eye on she has been on Ceftin this was December 18, 2022. She is not on that currently. Again she is on Coumadin. 02-11-2023 upon evaluation today patient appears to be doing decently well currently in regard to her leg wound. She is still is having some buildup of slough and necrotic debris at this time. With that being said I do think that overall for 1 week's time she has done decently  well. 02-18-2023 upon evaluation today patient appears to be doing well currently in regard to her wound although there is slough and biofilm buildup she has continued to have issues here with is slowly making progress towards healing. Fortunately I do not see any signs of active infection locally nor systemically at this time. No fevers, chills, nausea, vomiting, or diarrhea. Electronic  Signature(s) Signed: 02/18/2023 10:24:35 AM By: Jamie Derry PA-C Entered By: [name] [name] [name] on 02/18/2023 10:24:35 -------------------------------------------------------------------------------- Physical Exam Details Patient Name: Date of Service: [name], [name] 02/18/2023 9:45 A M Medical Record Number: [id number] Patient Account Number: [id number] Date of Birth/Sex: Treating RN: 02/19/1935 (88 y.o. [name] [name] [name] Primary Care Provider: Bari [name] Other Clinician: Referring Provider: Treating Provider/Extender: [name] [name] [name] in Treatment: 2 Constitutional Well-nourished and well-hydrated in no acute distress. Respiratory normal breathing without difficulty. Psychiatric this patient is able to make decisions and demonstrates good insight into disease process. Alert and Oriented x 3. pleasant and cooperative. Notes Upon inspection patient's wound bed actually showed signs of good granulation epithelization at this point. Fortunately I do not see any evidence of active infection locally or systemically which is great news and overall I am extremely pleased with where we stand currently. Postdebridement the wound bed looks to be doing better though it still has again very slow healing trajectory going on here. I think we may want to check for Apligraf is a possibility. Electronic Signature(s) Signed: 02/18/2023 10:25:07 AM By: Jamie Derry PA-C Entered By: [name] [name] [name] on 02/18/2023 10:25:07 -------------------------------------------------------------------------------- Physician  Orders Details Patient Name: Date of Service: [name] [name] [name] 02/18/2023 9:45 A M Medical Record Number: [id number] Patient Account Number: [id number] Date of Birth/Sex: Treating RN: [birth year] (88 y.o. [name] [name] [name] Primary Care Provider: Bari [name] Other Clinician: Referring Provider: Treating Provider/Extender: [name] [name] [name] in Treatment: 2 Verbal / Phone Orders: No Diagnosis Coding [name], [name] [name] ([id number]) [id number] Page 3 of 7 ICD-10 Coding Code Description [phone] Laceration without foreign body, right lower leg, initial encounter I87.331 Chronic venous hypertension (idiopathic) with ulcer and inflammation of right lower extremity L97.812 Non-pressure chronic ulcer of other part of right lower leg with fat layer exposed I48.0 Paroxysmal atrial fibrillation Z79.01 Long term (current) use of anticoagulants J44.9 Chronic obstructive pulmonary disease, unspecified I10 Essential (primary) hypertension 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 Follow-up Appointments Return Appointment in 1 week. Bathing/ Shower/ Hygiene Clean wound with Normal Saline or wound cleanser. May shower; gently cleanse wound with antibacterial soap, rinse and pat dry prior to dressing wounds No tub bath. Anesthetic (Use 'Patient Medications' Section for Anesthetic Order Entry) Lidocaine applied to wound bed Edema Control - Lymphedema / Segmental Compressive Device / Other Elevate, Exercise Daily and A void Standing for Long Periods of Time. Elevate leg(s) parallel to the floor when sitting. DO YOUR BEST to sleep in the bed at night. DO NOT sleep in your recliner. Long hours of sitting in a recliner leads to swelling of the legs and/or potential wounds on your backside. Wound Treatment Wound #1 - Lower Leg Wound  Laterality: Right, Lateral Cleanser: Byram Ancillary Kit - 15 Day Supply (Generic) 3 x Per Week/30 Days Discharge Instructions: Use supplies as instructed; Kit contains: (15) Saline Bullets; (15) 3x3 Gauze; 15 pr Gloves Cleanser: Soap and Water 3 x Per Week/30 Days Discharge Instructions: Gently cleanse wound with antibacterial soap, rinse and pat dry prior to dressing wounds Cleanser: Wound Cleanser 3 x Per Week/30 Days Discharge Instructions: Wash your hands with soap and water. Remove old dressing, discard into plastic bag and place into trash. Cleanse the wound with Wound Cleanser prior to applying a clean dressing using gauze sponges, not tissues or cotton balls. Do not scrub or use excessive force. Pat dry using gauze  sponges, not tissue or cotton balls. Prim Dressing: Hydrofera Blue Ready Transfer Foam, 2.5x2.5 (in/in) 3 x Per Week/30 Days ary Discharge Instructions: Apply Hydrofera Blue Ready to wound bed as directed Secondary Dressing: (BORDER) Zetuvit Plus SILICONE BORDER Dressing 5x5 (in/in) (Generic) 3 x Per Week/30 Days Discharge Instructions: Please do not put silicone bordered dressings under wraps. Use non-bordered dressing only. Electronic Signature(s) Unsigned Entered By: Angelina Pih on 02/18/2023 10:15:52 -------------------------------------------------------------------------------- Problem List Details Patient Name: Date of Service: [name], SANDMAN [name]. 02/18/2023 9:45 A M Medical Record Number: [id number] Patient Account Number: [id number] Date of Birth/Sex: Treating RN: 10/19/1934 (88 y.o. [name] [name] [name] Primary Care Provider: Bari [name] Other Clinician: Referring Provider: Treating Provider/Extender: [name] [name] [name] in Treatment: 2 Active Problems ICD-10 Encounter Code Description Active Date MDM Diagnosis [name], [name] [name] ([id number]) [id number] Page 4 of 7 [phone] Laceration without foreign body, right  lower leg, initial encounter 02/04/2023 No Yes I87.331 Chronic venous hypertension (idiopathic) with ulcer and inflammation of right 02/04/2023 No Yes lower extremity L97.812 Non-pressure chronic ulcer of other part of right lower leg with fat layer 02/04/2023 No Yes exposed I48.0 Paroxysmal atrial fibrillation 02/04/2023 No Yes Z79.01 Long term (current) use of anticoagulants 02/04/2023 No Yes J44.9 Chronic obstructive pulmonary disease, unspecified 02/04/2023 No Yes I10 Essential (primary) hypertension 02/04/2023 No Yes I25.10 Atherosclerotic heart disease of native coronary artery without angina pectoris 02/04/2023 No Yes I50.42 Chronic combined systolic (congestive) and diastolic (congestive) heart failure 02/04/2023 No Yes Inactive Problems Resolved Problems Electronic Signature(s) Signed: 02/18/2023 9:49:32 AM By: Jamie Derry PA-C Entered By: [name] [name] [name] on 02/18/2023 09:49:32 -------------------------------------------------------------------------------- Progress Note Details Patient Name: Date of Service: [name] [name] [name] 02/18/2023 9:45 A M Medical Record Number: [id number] Patient Account Number: [id number] Date of Birth/Sex: Treating RN: 21-Mar-1935 (88 y.o. [name] [name] [name] Primary Care Provider: Bari [name] Other Clinician: Referring Provider: Treating Provider/Extender: [name] [name] [name] in Treatment: 2 Subjective Chief Complaint Information obtained from Patient Right LE Ulcer History of Present Illness (HPI) 02-04-2023 patient presents today for initial evaluation here in our clinic concerning a wound which actually began around October 24, 2022. With that being said she notes that this was due to an injury and then has continued to be a problem not wanting to close and heal as quickly as would be expected. She is on long-term anticoagulant therapy due to atrial fibrillation. She also has hypertension, COPD, coronary artery disease, congestive heart failure,  and chronic venous insufficiency bilaterally. With that being said I do believe that this wound is currently showing some signs of improvement I do not see any obvious evidence of infection right now though that something we will keep a close eye on she has been on Ceftin this was December 18, 2022. She is not on that currently. Again she is on Coumadin. 02-11-2023 upon evaluation today patient appears to be doing decently well currently in regard to her leg wound. She is still is having some buildup of slough and necrotic debris at this time. With that being said I do think that overall for 1 week's time she has done decently well. 02-18-2023 upon evaluation today patient appears to be doing well currently in regard to her wound although there is slough and biofilm buildup she has continued [name], IZATT [name] ([id number]) [id number] Page 5 of 7 to have issues here with is slowly making progress towards healing. Fortunately I do not see any signs of active infection locally nor systemically at this time. No  fevers, chills, nausea, vomiting, or diarrhea. Objective Constitutional Well-nourished and well-hydrated in no acute distress. Vitals Time Taken: 9:32 AM, Height: 61 in, Weight: 108 lbs, BMI: 20.4, Temperature: 97.5 F, Pulse: 84 bpm, Respiratory Rate: 18 breaths/min, Blood Pressure: 94/55 mmHg. Respiratory normal breathing without difficulty. Psychiatric this patient is able to make decisions and demonstrates good insight into disease process. Alert and Oriented x 3. pleasant and cooperative. General Notes: Upon inspection patient's wound bed actually showed signs of good granulation epithelization at this point. Fortunately I do not see any evidence of active infection locally or systemically which is great news and overall I am extremely pleased with where we stand currently. Postdebridement the wound bed looks to be doing better though it still has again very slow healing  trajectory going on here. I think we may want to check for Apligraf is a possibility. Integumentary (Hair, Skin) Wound #1 status is Open. Original cause of wound was Skin T ear/Laceration. The date acquired was: 10/24/2022. The wound has been in treatment 2 weeks. The wound is located on the Right,Lateral Lower Leg. The wound measures 3.2cm length x 1.8cm width x 0.1cm depth; 4.524cm^2 area and 0.452cm^3 volume. There is Fat Layer (Subcutaneous Tissue) exposed. There is no tunneling or undermining noted. There is a medium amount of serosanguineous drainage noted. There is medium (34-66%) pink granulation within the wound bed. There is a medium (34-66%) amount of necrotic tissue within the wound bed including Adherent Slough. Assessment Active Problems ICD-10 Laceration without foreign body, right lower leg, initial encounter Chronic venous hypertension (idiopathic) with ulcer and inflammation of right lower extremity Non-pressure chronic ulcer of other part of right lower leg with fat layer exposed Paroxysmal atrial fibrillation Long term (current) use of anticoagulants Chronic obstructive pulmonary disease, unspecified Essential (primary) hypertension Atherosclerotic heart disease of native coronary artery without angina pectoris Chronic combined systolic (congestive) and diastolic (congestive) heart failure Procedures Wound #1 Pre-procedure diagnosis of Wound #1 is a Skin T located on the Right,Lateral Lower Leg . There was a Excisional Skin/Subcutaneous Tissue Debridement ear with a total area of 4.52 sq cm performed by Jamie Derry, PA-C. With the following instrument(s): Curette to remove Viable and Non-Viable tissue/material. Material removed includes Subcutaneous Tissue, Slough, and Biofilm after achieving pain control using Lidocaine 4% T opical Solution. No specimens were taken. A time out was conducted at 10:13, prior to the start of the procedure. A Moderate amount of bleeding was  controlled with Pressure. The procedure was tolerated well. Post Debridement Measurements: 3.2cm length x 1.8cm width x 0.1cm depth; 0.452cm^3 volume. Character of Wound/Ulcer Post Debridement is stable. Post procedure Diagnosis Wound #1: Same as Pre-Procedure Plan Follow-up Appointments: Return Appointment in 1 week. Bathing/ Shower/ Hygiene: Clean wound with Normal Saline or wound cleanser. May shower; gently cleanse wound with antibacterial soap, rinse and pat dry prior to dressing wounds No tub bath. Anesthetic (Use 'Patient Medications' Section for Anesthetic Order Entry): Lidocaine applied to wound bed [name], MCGLOWN [name] ([id number]) [phone].pdf Page 6 of 7 Edema Control - Lymphedema / Segmental Compressive Device / Other: Elevate, Exercise Daily and Avoid Standing for Long Periods of Time. Elevate leg(s) parallel to the floor when sitting. DO YOUR BEST to sleep in the bed at night. DO NOT sleep in your recliner. Long hours of sitting in a recliner leads to swelling of the legs and/or potential wounds on your backside. WOUND #1: - Lower Leg Wound Laterality: Right, Lateral Cleanser: Byram Ancillary Kit - 15 Day Supply (Generic)  3 x Per Week/30 Days Discharge Instructions: Use supplies as instructed; Kit contains: (15) Saline Bullets; (15) 3x3 Gauze; 15 pr Gloves Cleanser: Soap and Water 3 x Per Week/30 Days Discharge Instructions: Gently cleanse wound with antibacterial soap, rinse and pat dry prior to dressing wounds Cleanser: Wound Cleanser 3 x Per Week/30 Days Discharge Instructions: Wash your hands with soap and water. Remove old dressing, discard into plastic bag and place into trash. Cleanse the wound with Wound Cleanser prior to applying a clean dressing using gauze sponges, not tissues or cotton balls. Do not scrub or use excessive force. Pat dry using gauze sponges, not tissue or cotton balls. Prim Dressing: Hydrofera Blue Ready Transfer Foam, 2.5x2.5  (in/in) 3 x Per Week/30 Days ary Discharge Instructions: Apply Hydrofera Blue Ready to wound bed as directed Secondary Dressing: (BORDER) Zetuvit Plus SILICONE BORDER Dressing 5x5 (in/in) (Generic) 3 x Per Week/30 Days Discharge Instructions: Please do not put silicone bordered dressings under wraps. Use non-bordered dressing only. 1. Based on what I am seeing I do think that the patient should continue to monitor for any signs of infection or worsening. Based on what I am seeing I do believe that we will go ahead and continue with the dressing changes with the bordered foam although we will switch to [facility]. 2. I am also can recommend that we have the patient continue to monitor for any signs of infection or worsening. Obviously if anything changes she knows contact the office and let me know. We will see patient back for reevaluation in 1 week here in the clinic. If anything worsens or changes patient will contact our office for additional recommendations. Electronic Signature(s) Signed: 02/18/2023 10:25:35 AM By: Jamie Derry PA-C Entered By: [name] [name] [name] on 02/18/2023 10:25:35 -------------------------------------------------------------------------------- SuperBill Details Patient Name: Date of Service: [name] [name] [name] 02/18/2023 Medical Record Number: [id number] Patient Account Number: [id number] Date of Birth/Sex: Treating RN: 09-Feb-1935 (88 y.o. [name] [name] [name] Primary Care Provider: Bari [name] Other Clinician: Referring Provider: Treating Provider/Extender: [name] [name] [name] in Treatment: 2 Diagnosis Coding ICD-10 Codes Code Description [phone] Laceration without foreign body, right lower leg, initial encounter I87.331 Chronic venous hypertension (idiopathic) with ulcer and inflammation of right lower extremity L97.812 Non-pressure chronic ulcer of other part of right lower leg with fat layer exposed I48.0 Paroxysmal atrial fibrillation Z79.01 Long  term (current) use of anticoagulants J44.9 Chronic obstructive pulmonary disease, unspecified I10 Essential (primary) hypertension 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 Facility Procedures : CPT4 Code: [id number] Description: 11042 - DEB SUBQ TISSUE 20 SQ CM/< ICD-10 Diagnosis Description L97.812 Non-pressure chronic ulcer of other part of right lower leg with fat layer expo Modifier: sed Quantity: 1 Physician Procedures Electronic Signature(s) Signed: 02/18/2023 10:25:47 AM By: Jamie Derry PA-C Entered By: [name] [name] [name] on 02/18/2023 10:25:47

## 2023-02-19 NOTE — Progress Notes (Addendum)
Jamie, [name] ([id number]) [id number] Page 1 of 9 Visit Report for 02/18/2023 Arrival Information Details Patient Name: Date of Service: Jamie, [name]. 02/18/2023 9:45 A M Medical Record Number: [id number] Patient Account Number: [id number] Date of Birth/Sex: Treating RN: [birth year] (88 y.o. [name] Primary Care Alexiz Cothran: Bari Edward Other Clinician: Referring Dekayla Prestridge: Treating Analee Montee/Extender: Francee Piccolo in Treatment: 2 Visit Information History Since Last Visit Added or deleted any medications: No Patient Arrived: [name] Any new allergies or adverse reactions: No Arrival Time: 09:31 Had a fall or experienced change in No Accompanied By: spouse activities of daily living that may affect Transfer Assistance: None risk of falls: Patient Identification Verified: Yes Hospitalized since last visit: No Secondary Verification Process Completed: Yes Has Dressing in Place as Prescribed: Yes Patient Has Alerts: Yes Pain Present Now: No Patient Alerts: Patient on Blood Thinner PACEMAKER 02/13/23 ABI [name] 02/13/23 ABI L 1.26 02/13/23 TBI [name] 0.91 02/13/23 TBI L 0.86 Electronic Signature(s) Signed: 02/20/2023 3:26:47 PM By: Angelina Pih Previous Signature: 02/18/2023 4:39:57 PM Version By: Angelina Pih Entered By: Angelina Pih on 02/20/2023 15:26:47 -------------------------------------------------------------------------------- Clinic Level of Care Assessment Details Patient Name: Date of Service: Jamie, GLASSMEYER [name] 02/18/2023 9:45 A M Medical Record Number: [id number] Patient Account Number: [id number] Date of Birth/Sex: Treating RN: [birth year] (88 y.o. [name] Primary Care Tamirah George: Bari Edward Other Clinician: Referring Floye Fesler: Treating Kelbi Renstrom/Extender: Francee Piccolo in Treatment: 2 Clinic Level of Care Assessment Items TOOL 1 Quantity Score []  - 0 Use when EandM and Procedure  is performed on INITIAL visit ASSESSMENTS - Nursing Assessment / Reassessment []  - 0 General Physical Exam (combine w/ comprehensive assessment (listed just below) when performed on new pt. evals) []  - 0 Comprehensive Assessment (HX, ROS, Risk Assessments, Wounds Hx, etc.) [name] ([id number]) [id number] Page 2 of 9 ASSESSMENTS - Wound and Skin Assessment / Reassessment []  - 0 Dermatologic / Skin Assessment (not related to wound area) ASSESSMENTS - Ostomy and/or Continence Assessment and Care []  - 0 Incontinence Assessment and Management []  - 0 Ostomy Care Assessment and Management (repouching, etc.) PROCESS - Coordination of Care []  - 0 Simple Patient / Family Education for ongoing care []  - 0 Complex (extensive) Patient / Family Education for ongoing care []  - 0 Staff obtains Chiropractor, Records, T Results / Process Orders est []  - 0 Staff telephones HHA, Nursing Homes / Clarify orders / etc []  - 0 Routine Transfer to another Facility (non-emergent condition) []  - 0 Routine Hospital Admission (non-emergent condition) []  - 0 New Admissions / Manufacturing engineer / Ordering NPWT Apligraf, etc. , []  - 0 Emergency Hospital Admission (emergent condition) PROCESS - Special Needs []  - 0 Pediatric / Minor Patient Management []  - 0 Isolation Patient Management []  - 0 Hearing / Language / Visual special needs []  - 0 Assessment of Community assistance (transportation, D/C planning, etc.) []  - 0 Additional assistance / Altered mentation []  - 0 Support Surface(s) Assessment (bed, cushion, seat, etc.) INTERVENTIONS - Miscellaneous []  - 0 External ear exam []  - 0 Patient Transfer (multiple staff / Nurse, adult / Similar devices) []  - 0 Simple Staple / Suture removal (25 or less) []  - 0 Complex Staple / Suture removal (26 or more) []  - 0 Hypo/Hyperglycemic Management (do not check if billed separately) []  - 0 Ankle / Brachial Index (ABI) -  do not check if billed separately Has the patient been seen at the hospital within the last three  years: Yes Total Score: 0 Level Of Care: ____ Electronic Signature(s) Signed: 02/18/2023 4:39:57 PM By: Angelina Pih Entered By: Angelina Pih on 02/18/2023 10:16:52 -------------------------------------------------------------------------------- Encounter Discharge Information Details Patient Name: Date of Service: [name] [name] 02/18/2023 9:45 A M Medical Record Number: [id number] Patient Account Number: [id number] Date of Birth/Sex: Treating RN: 11-18-1934 (88 y.o. [name] Primary Care Shajuana Mclucas: Bari Edward Other Clinician: Referring Garwood Wentzell: Treating Muaz Shorey/Extender: Tonye Pearson [name], [name] ([id number]) [id number] Page 3 of 9 Weeks in Treatment: 2 Encounter Discharge Information Items Post Procedure Vitals Discharge Condition: Stable Temperature (F): 97.5 Ambulatory Status: Cane Pulse (bpm): 84 Discharge Destination: Home Respiratory Rate (breaths/min): 18 Transportation: Private Auto Blood Pressure (mmHg): 94/55 Accompanied By: spouse Schedule Follow-up Appointment: Yes Clinical Summary of Care: Electronic Signature(s) Signed: 02/18/2023 4:39:57 PM By: Angelina Pih Entered By: Angelina Pih on 02/18/2023 10:17:57 -------------------------------------------------------------------------------- Lower Extremity Assessment Details Patient Name: Date of Service: Jamie, FROME [name] 02/18/2023 9:45 A M Medical Record Number: [id number] Patient Account Number: [id number] Date of Birth/Sex: Treating RN: [birth year] (89 y.o. [name] Primary Care Elany Felix: Bari Edward Other Clinician: Referring Denette Hass: Treating Valerye Kobus/Extender: Tonye Pearson Weeks in Treatment: 2 Edema Assessment Assessed: [Left: No] [Right: No] [Left: Edema] [Right: :] Calf Left: Right: Point of Measurement: 32  cm From Medial Instep 29 cm Ankle Left: Right: Point of Measurement: 11 cm From Medial Instep 18.2 cm Vascular Assessment Pulses: Dorsalis Pedis Palpable: [Right:Yes] Posterior Tibial Palpable: [Right:Yes] Electronic Signature(s) Signed: 02/18/2023 4:39:57 PM By: Angelina Pih Entered By: Angelina Pih on 02/18/2023 09:39:25 Merri Ray ([id number]) [id number] Page 4 of 9 -------------------------------------------------------------------------------- Multi Wound Chart Details Patient Name: Date of Service: Jamie, FURY [name]. 02/18/2023 9:45 A M Medical Record Number: [id number] Patient Account Number: [id number] Date of Birth/Sex: Treating RN: [birth year] (88 y.o. [name] Primary Care Ashlen Kiger: Bari Edward Other Clinician: Referring Teon Hudnall: Treating Kimber Fritts/Extender: Francee Piccolo in Treatment: 2 Vital Signs Height(in): 61 Pulse(bpm): 84 Weight(lbs): 108 Blood Pressure(mmHg): 94/55 Body Mass Index(BMI): 20.4 Temperature(F): 97.5 Respiratory Rate(breaths/min): 18 [1:Photos:] [N/A:N/A] Right, Lateral Lower Leg N/A N/A Wound Location: Skin Tear/Laceration N/A N/A Wounding Event: Skin Tear N/A N/A Primary Etiology: Chronic Obstructive Pulmonary N/A N/A Comorbid History: Disease (COPD), Arrhythmia, Congestive Heart Failure, Coronary Artery Disease, Hypertension, Received Radiation 10/24/2022 N/A N/A Date Acquired: 2 N/A N/A Weeks of Treatment: Open N/A N/A Wound Status: No N/A N/A Wound Recurrence: 3.2x1.8x0.1 N/A N/A Measurements L x W x D (cm) 4.524 N/A N/A A (cm) : rea 0.452 N/A N/A Volume (cm) : 3.00% N/A N/A % Reduction in Area: 3.20% N/A N/A % Reduction in Volume: Full Thickness Without Exposed N/A N/A Classification: Support Structures Medium N/A N/A Exudate A mount: Serosanguineous N/A N/A Exudate Type: red, brown N/A N/A Exudate Color: Medium (34-66%) N/A N/A Granulation A  mount: Pink N/A N/A Granulation Quality: Medium (34-66%) N/A N/A Necrotic A mount: Fat Layer (Subcutaneous Tissue): Yes N/A N/A Exposed Structures: None N/A N/A Epithelialization: Treatment Notes Electronic Signature(s) Signed: 02/18/2023 4:39:57 PM By: Angelina Pih Entered By: Angelina Pih on 02/18/2023 10:13:02 Carma Lair [name] ([id number]) [id number] Page 5 of 9 -------------------------------------------------------------------------------- Multi-Disciplinary Care Plan Details Patient Name: Date of Service: Jamie, NEUHART [name]. 02/18/2023 9:45 A M Medical Record Number: [id number] Patient Account Number: [id number] Date of Birth/Sex: Treating RN: [birth year] (88 y.o. [name] Primary Care Sybil Shrader: Bari Edward Other Clinician: Referring Nicolas Sisler: Treating Mylan Schwarz/Extender: Francee Piccolo in Treatment: 2 Active Inactive Necrotic  Tissue Nursing Diagnoses: Impaired tissue integrity related to necrotic/devitalized tissue Goals: Necrotic/devitalized tissue will be minimized in the wound bed Date Initiated: 02/04/2023 Target Resolution Date: 03/04/2023 Goal Status: Active Patient/caregiver will verbalize understanding of reason and process for debridement of necrotic tissue Date Initiated: 02/04/2023 Date Inactivated: 02/04/2023 Target Resolution Date: 02/04/2023 Goal Status: Met Interventions: Assess patient pain level pre-, during and post procedure and prior to discharge Provide education on necrotic tissue and debridement process Treatment Activities: Excisional debridement : 02/04/2023 Notes: Venous Leg Ulcer Nursing Diagnoses: Potential for venous Insuffiency (use before diagnosis confirmed) Goals: Non-invasive venous studies are completed as ordered Date Initiated: 02/04/2023 Date Inactivated: 02/18/2023 Target Resolution Date: 02/18/2023 Goal Status: Met Patient will maintain optimal edema control Date  Initiated: 02/04/2023 Target Resolution Date: 03/04/2023 Goal Status: Active Patient/caregiver will verbalize understanding of disease process and disease management Date Initiated: 02/04/2023 Target Resolution Date: 02/11/2023 Goal Status: Active Verify adequate tissue perfusion prior to therapeutic compression application Date Initiated: 02/04/2023 Target Resolution Date: 02/18/2023 Goal Status: Active Interventions: Assess peripheral edema status every visit. Provide education on venous insufficiency Notes: Wound/Skin Impairment Nursing Diagnoses: Impaired tissue integrity Knowledge deficit related to ulceration/compromised skin integrity Jamie, GENERETTE [name] ([id number]) [id number] Page 6 of 9 Goals: Ulcer/skin breakdown will have a volume reduction of 30% by week 4 Date Initiated: 02/04/2023 Target Resolution Date: 03/04/2023 Goal Status: Active Ulcer/skin breakdown will have a volume reduction of 50% by week 8 Date Initiated: 02/04/2023 Target Resolution Date: 04/01/2023 Goal Status: Active Ulcer/skin breakdown will have a volume reduction of 80% by week 12 Date Initiated: 02/04/2023 Target Resolution Date: 04/29/2023 Goal Status: Active Ulcer/skin breakdown will heal within 14 weeks Date Initiated: 02/04/2023 Target Resolution Date: 05/13/2023 Goal Status: Active Interventions: Assess patient/caregiver ability to obtain necessary supplies Assess patient/caregiver ability to perform ulcer/skin care regimen upon admission and as needed Assess ulceration(s) every visit Provide education on ulcer and skin care Treatment Activities: Skin care regimen initiated : 02/04/2023 Notes: Electronic Signature(s) Signed: 02/18/2023 4:39:57 PM By: Angelina Pih Entered By: Angelina Pih on 02/18/2023 10:17:09 -------------------------------------------------------------------------------- Pain Assessment Details Patient Name: Date of Service: Jamie, DEASY [name] 02/18/2023  9:45 A M Medical Record Number: [id number] Patient Account Number: [id number] Date of Birth/Sex: Treating RN: [birth year] (88 y.o. [name] Primary Care Crystie Yanko: Bari Edward Other Clinician: Referring Zionna Homewood: Treating Frimet Durfee/Extender: Francee Piccolo in Treatment: 2 Active Problems Location of Pain Severity and Description of Pain Patient Has Paino No Site Locations Rate the pain. Current Pain Level: 0 Jamie, HENDRIKS [name] ([id number]) [id number] Page 7 of 9 Pain Management and Medication Current Pain Management: Electronic Signature(s) Signed: 02/18/2023 4:39:57 PM By: Angelina Pih Entered By: Angelina Pih on 02/18/2023 09:34:25 -------------------------------------------------------------------------------- Patient/Caregiver Education Details Patient Name: Date of Service: [name] [name] 6/11/2024andnbsp9:45 A M Medical Record Number: [id number] Patient Account Number: [id number] Date of Birth/Gender: Treating RN: [birth year] (88 y.o. [name] Primary Care Physician: Bari Edward Other Clinician: Referring Physician: Treating Physician/Extender: Francee Piccolo in Treatment: 2 Education Assessment Education Provided To: Patient Education Topics Provided Wound Debridement: Handouts: Wound Debridement Methods: Explain/Verbal Responses: State content correctly Wound/Skin Impairment: Handouts: Caring for Your Ulcer Methods: Explain/Verbal Responses: State content correctly Electronic Signature(s) Signed: 02/18/2023 4:39:57 PM By: Angelina Pih Entered By: Angelina Pih on 02/18/2023 10:17:20 -------------------------------------------------------------------------------- Wound Assessment Details Patient Name: Date of Service: Jamie, MURRELL [name] 02/18/2023 9:45 A M Medical Record Number: [id number] Patient Account Number: [id number] Date of Birth/Sex: Treating RN: 02/17/1935 (88 y.o.  [name] Primary Care Felipa Laroche: Bari Edward Other Clinician: Referring Garmon Dehn: Treating Elisha Mcgruder/Extender: Tonye Pearson [place], [name] ([id number]) [id number] Page 8 of 9 Weeks in Treatment: 2 Wound Status Wound Number: 1 Primary Skin T ear Etiology: Wound Location: Right, Lateral Lower Leg Wound Open Wounding Event: Skin Tear/Laceration Status: Date Acquired: 10/24/2022 Comorbid Chronic Obstructive Pulmonary Disease (COPD), Arrhythmia, Weeks Of Treatment: 2 History: Congestive Heart Failure, Coronary Artery Disease, Hypertension, Clustered Wound: No Received Radiation Photos Wound Measurements Length: (cm) 3.2 Width: (cm) 1.8 Depth: (cm) 0.1 Area: (cm) 4.524 Volume: (cm) 0.452 % Reduction in Area: 3% % Reduction in Volume: 3.2% Epithelialization: None Tunneling: No Undermining: No Wound Description Classification: Full Thickness Without Exposed Suppo Exudate Amount: Medium Exudate Type: Serosanguineous Exudate Color: red, brown rt Structures Foul Odor After Cleansing: No Slough/Fibrino Yes Wound Bed Granulation Amount: Medium (34-66%) Exposed Structure Granulation Quality: Pink Fat Layer (Subcutaneous Tissue) Exposed: Yes Necrotic Amount: Medium (34-66%) Necrotic Quality: Adherent Slough Treatment Notes Wound #1 (Lower Leg) Wound Laterality: Right, Lateral Cleanser [place] Ancillary Kit - 15 Day Supply Discharge Instruction: Use supplies as instructed; Kit contains: (15) Saline Bullets; (15) 3x3 Gauze; 15 pr Gloves Soap and Water Discharge Instruction: Gently cleanse wound with antibacterial soap, rinse and pat dry prior to dressing wounds Wound Cleanser Discharge Instruction: Wash your hands with soap and water. Remove old dressing, discard into plastic bag and place into trash. Cleanse the wound with Wound Cleanser prior to applying a clean dressing using gauze sponges, not tissues or cotton balls. Do not  scrub or use excessive force. Pat dry using gauze sponges, not tissue or cotton balls. Peri-Wound Care Topical Primary Dressing Hydrofera Blue Ready Transfer Foam, 2.5x2.5 (in/in) Discharge Instruction: Apply Hydrofera Blue Ready to wound bed as directed Secondary Dressing (BORDER) Zetuvit Plus SILICONE BORDER Dressing 5x5 (in/in) Discharge Instruction: Please do not put silicone bordered dressings under wraps. Use non-bordered dressing only. Secured With Compression ILAN, [name] ([id number]) [id number] Page 9 of 9 Compression Stockings Add-Ons Electronic Signature(s) Signed: 02/18/2023 4:39:57 PM By: Angelina Pih Entered By: Angelina Pih on 02/18/2023 09:39:06 -------------------------------------------------------------------------------- Vitals Details Patient Name: Date of Service: [name] [name] 02/18/2023 9:45 A M Medical Record Number: [id number] Patient Account Number: [id number] Date of Birth/Sex: Treating RN: 17-Sep-1934 (88 y.o. [name] Primary Care Charlotte Fidalgo: Bari Edward Other Clinician: Referring Lawyer Washabaugh: Treating Caidence Kaseman/Extender: Francee Piccolo in Treatment: 2 Vital Signs Time Taken: 09:32 Temperature (F): 97.5 Height (in): 61 Pulse (bpm): 84 Weight (lbs): 108 Respiratory Rate (breaths/min): 18 Body Mass Index (BMI): 20.4 Blood Pressure (mmHg): 94/55 Reference Range: 80 - 120 mg / dl Electronic Signature(s) Signed: 02/18/2023 4:39:57 PM By: Angelina Pih Entered By: Angelina Pih on 02/18/2023 09:34:20

## 2023-02-20 ENCOUNTER — Telehealth: Payer: Self-pay | Admitting: Cardiovascular Disease

## 2023-02-20 NOTE — Telephone Encounter (Signed)
Please schedule 6 month F/U appt for 90 day refills. Thank you! 

## 2023-02-21 ENCOUNTER — Encounter: Payer: Medicare Other | Admitting: Internal Medicine

## 2023-02-24 NOTE — Progress Notes (Signed)
MALEY, BIBLE ([id number]) [id number] Page 1 of 10 Visit Report for 02/04/2023 Chief Complaint Document Details Patient Name: Date of Service: [name], [name] 02/04/2023 8:15 A M Medical Record Number: [id number] Patient Account Number: [id number] Date of Birth/Sex: Treating RN: 1934/11/08 (88 y.o. [name] Primary Care Provider: Bari Edward Other Clinician: Referring Provider: Treating Provider/Extender: Francee Piccolo in Treatment: 0 Information Obtained from: Patient Chief Complaint Right LE Ulcer Electronic Signature(s) Signed: 02/04/2023 9:05:50 AM By: Allen Derry PA-C Entered By: Allen Derry on 02/04/2023 09:05:50 -------------------------------------------------------------------------------- Debridement Details Patient Name: Date of Service: Jamie Davenport RA R. 02/04/2023 8:15 A M Medical Record Number: [id number] Patient Account Number: [id number] Date of Birth/Sex: Treating RN: [birth year] (88 y.o. [name] Primary Care Provider: Bari Edward Other Clinician: Referring Provider: Treating Provider/Extender: Francee Piccolo in Treatment: 0 Debridement Performed for Assessment: Wound #1 Right,Lateral Lower Leg Performed By: Physician Allen Derry, PA-C Debridement Type: Debridement Level of Consciousness (Pre-procedure): Awake and Alert Pre-procedure Verification/Time Out Yes - 09:24 Taken: Pain Control: Lidocaine 4% T opical Solution Percent of Wound Bed Debrided: 100% T Area Debrided (cm): otal 4.66 Tissue and other material debrided: Viable, Non-Viable, Slough, Subcutaneous, Biofilm, Slough Level: Skin/Subcutaneous Tissue Debridement Description: Excisional Instrument: Curette Bleeding: Moderate Hemostasis Achieved: Pressure Response to Treatment: Procedure was tolerated well Level of Consciousness (Post- Awake and Alert procedure): [name], [name] ([id number])  [id number] Page 2 of 10 Post Debridement Measurements of Total Wound Length: (cm) 3.3 Width: (cm) 1.8 Depth: (cm) 0.1 Volume: (cm) 0.467 Character of Wound/Ulcer Post Debridement: Stable Post Procedure Diagnosis Same as Pre-procedure Electronic Signature(s) Signed: 02/04/2023 4:52:49 PM By: Angelina Pih Signed: 02/05/2023 5:03:24 PM By: Allen Derry PA-C Entered By: Angelina Pih on 02/04/2023 09:28:41 -------------------------------------------------------------------------------- HPI Details Patient Name: Date of Service: Jamie Davenport RA R. 02/04/2023 8:15 A M Medical Record Number: [id number] Patient Account Number: [id number] Date of Birth/Sex: Treating RN: Nov 04, 1934 (88 y.o. [name] Primary Care Provider: Bari Edward Other Clinician: Referring Provider: Treating Provider/Extender: Francee Piccolo in Treatment: 0 History of Present Illness HPI Description: 02-04-2023 patient presents today for initial evaluation here in our clinic concerning a wound which actually began around October 24, 2022. With that being said she notes that this was due to an injury and then has continued to be a problem not wanting to close and heal as quickly as would be expected. She is on long-term anticoagulant therapy due to atrial fibrillation. She also has hypertension, COPD, coronary artery disease, congestive heart failure, and chronic venous insufficiency bilaterally. With that being said I do believe that this wound is currently showing some signs of improvement I do not see any obvious evidence of infection right now though that something we will keep a close eye on she has been on Ceftin this was December 18, 2022. She is not on that currently. Again she is on Coumadin. Electronic Signature(s) Signed: 02/05/2023 4:51:58 PM By: Allen Derry PA-C Previous Signature: 02/04/2023 5:33:16 PM Version By: Allen Derry PA-C Entered By: Allen Derry  on 02/05/2023 16:51:58 -------------------------------------------------------------------------------- Physical Exam Details Patient Name: Date of Service: [name], [name] RA R. 02/04/2023 8:15 A M Medical Record Number: [id number] Patient Account Number: [id number] Date of Birth/Sex: Treating RN: 10/22/1934 (88 y.o. [name] Primary Care Provider: Bari Edward Other Clinician: Referring Provider: Treating Provider/Extender: Francee Piccolo in Treatment: 0 [name], [name] R ([id number]) [id number] Page 3 of 10 Constitutional sitting or standing blood pressure is  within target range for patient.. pulse regular and within target range for patient.Marland Kitchen respirations regular, non-labored and within target range for patient.Marland Kitchen temperature within target range for patient.. Well-nourished and well-hydrated in no acute distress. Eyes conjunctiva clear no eyelid edema noted. pupils equal round and reactive to light and accommodation. Ears, Nose, Mouth, and Throat no gross abnormality of ear auricles or external auditory canals. normal hearing noted during conversation. mucus membranes moist. Respiratory normal breathing without difficulty. Cardiovascular 2+ dorsalis pedis/posterior tibialis pulses. 1+ pitting edema of the bilateral lower extremities. Musculoskeletal normal gait and posture. no significant deformity or arthritic changes, no loss or range of motion, no clubbing. Psychiatric this patient is able to make decisions and demonstrates good insight into disease process. Alert and Oriented x 3. pleasant and cooperative. Notes Upon inspection patient's wound bed actually showed signs of good granulation and epithelization at this point. Fortunately I do see some improvement with regard to some of the necrotic tissue which looks like it is actually clearing up nicely for her and then in some areas I still see some areas where we will get a need  to perform some debridement to clearway the necrotic debris. Subsequently after obtaining consent I did actually perform debridement clearway slough and biofilm down to good subcutaneous tissue she tolerated that debridement today without complication and postdebridement the wound bed actually appears to be doing much better. Were not completely clean but it is significantly better compared to where we started. She is pleased with how things appear and here today. As Janit Bern Electronic Signature(s) Signed: 02/05/2023 4:52:58 PM By: Allen Derry PA-C Entered By: Allen Derry on 02/05/2023 16:52:58 -------------------------------------------------------------------------------- Physician Orders Details Patient Name: Date of Service: Jamie Davenport RA R. 02/04/2023 8:15 A M Medical Record Number: [id number] Patient Account Number: [id number] Date of Birth/Sex: Treating RN: 10/23/1934 (88 y.o. [name] Primary Care Provider: Bari Edward Other Clinician: Referring Provider: Treating Provider/Extender: Francee Piccolo in Treatment: 0 Verbal / Phone Orders: No Diagnosis Coding ICD-10 Coding Code Description [phone] Laceration without foreign body, right lower leg, initial encounter I87.331 Chronic venous hypertension (idiopathic) with ulcer and inflammation of right lower extremity L97.812 Non-pressure chronic ulcer of other part of right lower leg with fat layer exposed I48.0 Paroxysmal atrial fibrillation Z79.01 Long term (current) use of anticoagulants J44.9 Chronic obstructive pulmonary disease, unspecified I10 Essential (primary) hypertension 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 [name], [name] R ([id number]) [id number] Page 4 of 10 Follow-up Appointments Return Appointment in 1 week. Bathing/ Shower/  Hygiene Clean wound with Normal Saline or wound cleanser. May shower; gently cleanse wound with antibacterial soap, rinse and pat dry prior to dressing wounds No tub bath. Anesthetic (Use 'Patient Medications' Section for Anesthetic Order Entry) Lidocaine applied to wound bed Edema Control - Lymphedema / Segmental Compressive Device / Other Elevate, Exercise Daily and A void Standing for Long Periods of Time. Elevate leg(s) parallel to the floor when sitting. DO YOUR BEST to sleep in the bed at night. DO NOT sleep in your recliner. Long hours of sitting in a recliner leads to swelling of the legs and/or potential wounds on your backside. Wound Treatment Wound #1 - Lower Leg Wound Laterality: Right, Lateral Cleanser: Byram Ancillary Kit - 15 Day Supply (DME) (Generic) 3 x Per Week/30 Days Discharge Instructions: Use supplies as instructed; Kit contains: (15) Saline Bullets; (15) 3x3 Gauze; 15 pr Gloves Cleanser:  Soap and Water 3 x Per Week/30 Days Discharge Instructions: Gently cleanse wound with antibacterial soap, rinse and pat dry prior to dressing wounds Cleanser: Wound Cleanser 3 x Per Week/30 Days Discharge Instructions: Wash your hands with soap and water. Remove old dressing, discard into plastic bag and place into trash. Cleanse the wound with Wound Cleanser prior to applying a clean dressing using gauze sponges, not tissues or cotton balls. Do not scrub or use excessive force. Pat dry using gauze sponges, not tissue or cotton balls. Prim Dressing: Prisma 4.34 (in) (DME) (Generic) 3 x Per Week/30 Days ary Discharge Instructions: Moisten w/normal saline or sterile water; Cover wound as directed. Do not remove from wound bed. Secondary Dressing: (BORDER) Zetuvit Plus SILICONE BORDER Dressing 5x5 (in/in) (DME) (Generic) 3 x Per Week/30 Days Discharge Instructions: Please do not put silicone bordered dressings under wraps. Use non-bordered dressing only. Services and Therapies nkle  Brachial Index (ABI) - bilateral ABI/TBI A Electronic Signature(s) Signed: 02/04/2023 4:52:49 PM By: Angelina Pih Signed: 02/05/2023 5:03:24 PM By: Allen Derry PA-C Entered By: Angelina Pih on 02/04/2023 09:38:14 -------------------------------------------------------------------------------- Problem List Details Patient Name: Date of Service: Jamie Davenport RA R. 02/04/2023 8:15 A M Medical Record Number: [id number] Patient Account Number: [id number] Date of Birth/Sex: Treating RN: [birth year] (88 y.o. [name] Primary Care Provider: Bari Edward Other Clinician: Referring Provider: Treating Provider/Extender: Francee Piccolo in Treatment: 0 Active Problems ICD-10 Encounter Code Description Active Date MDM Diagnosis S81.811A Laceration without foreign body, right lower leg, initial encounter 02/04/2023 No Yes [name], [name] ([id number]) [id number] Page 5 of 10 I87.331 Chronic venous hypertension (idiopathic) with ulcer and inflammation of right 02/04/2023 No Yes lower extremity L97.812 Non-pressure chronic ulcer of other part of right lower leg with fat layer 02/04/2023 No Yes exposed I48.0 Paroxysmal atrial fibrillation 02/04/2023 No Yes Z79.01 Long term (current) use of anticoagulants 02/04/2023 No Yes J44.9 Chronic obstructive pulmonary disease, unspecified 02/04/2023 No Yes I10 Essential (primary) hypertension 02/04/2023 No Yes I25.10 Atherosclerotic heart disease of native coronary artery without angina pectoris 02/04/2023 No Yes I50.42 Chronic combined systolic (congestive) and diastolic (congestive) heart failure 02/04/2023 No Yes Inactive Problems Resolved Problems Electronic Signature(s) Signed: 02/04/2023 9:05:38 AM By: Allen Derry PA-C Entered By: Allen Derry on 02/04/2023 09:05:37 -------------------------------------------------------------------------------- Progress Note Details Patient Name: Date of  Service: Jamie Davenport RA R. 02/04/2023 8:15 A M Medical Record Number: [id number] Patient Account Number: [id number] Date of Birth/Sex: Treating RN: [birth year] (88 y.o. [name] Primary Care Provider: Bari Edward Other Clinician: Referring Provider: Treating Provider/Extender: Francee Piccolo in Treatment: 0 Subjective Chief Complaint Information obtained from Patient Right LE Ulcer History of Present Illness (HPI) [name], [name] ([id number]) [id number] Page 6 of 10 02-04-2023 patient presents today for initial evaluation here in our clinic concerning a wound which actually began around October 24, 2022. With that being said she notes that this was due to an injury and then has continued to be a problem not wanting to close and heal as quickly as would be expected. She is on long-term anticoagulant therapy due to atrial fibrillation. She also has hypertension, COPD, coronary artery disease, congestive heart failure, and chronic venous insufficiency bilaterally. With that being said I do believe that this wound is currently showing some signs of improvement I do not see any obvious evidence of infection right now though that something we will keep a close eye on she has been on Ceftin this was December 18, 2022. She is  not on that currently. Again she is on Coumadin. Patient History Information obtained from Patient. Allergies ACE Inhibitors, amoxicillin (Reaction: pot clavulanate), penicillin, rosuvastatin, nifedipine, propranolol, diazepam, meperidine, propoxyphene Social History Former smoker - quit 25 yrs ago, Alcohol Use - Never, Drug Use - No History, Caffeine Use - Daily. Medical History Respiratory Patient has history of Chronic Obstructive Pulmonary Disease (COPD) Cardiovascular Patient has history of Arrhythmia - a fib, Congestive Heart Failure, Coronary Artery Disease, Hypertension Oncologic Patient has history of Received  Radiation - 2002 Medical A Surgical History Notes nd Musculoskeletal arthritis Review of Systems (ROS) Constitutional Symptoms (General Health) Denies complaints or symptoms of Fatigue, Fever, Chills, Marked Weight Change. Eyes Complains or has symptoms of Glasses / Contacts, macular degeneration Ear/Nose/Mouth/Throat can only hear out of the left side Hematologic/Lymphatic Denies complaints or symptoms of Bleeding / Clotting Disorders, Human Immunodeficiency Virus, pt on blood thinner Respiratory wears 2L at all times Gastrointestinal Denies complaints or symptoms of Frequent diarrhea, Nausea, Vomiting. Endocrine Complains or has symptoms of Thyroid disease - hypothyroidism. Genitourinary chronic kidney stage 3 Immunological Denies complaints or symptoms of Hives, Itching. Integumentary (Skin) Denies complaints or symptoms of Wounds, Bleeding or bruising tendency, Breakdown, Swelling. Neurologic Denies complaints or symptoms of Numbness/parasthesias, Focal/Weakness. Oncologic breast CA Psychiatric Denies complaints or symptoms of Anxiety, Claustrophobia. Objective Constitutional sitting or standing blood pressure is within target range for patient.. pulse regular and within target range for patient.Marland Kitchen respirations regular, non-labored and within target range for patient.Marland Kitchen temperature within target range for patient.. Well-nourished and well-hydrated in no acute distress. Vitals Time Taken: 8:18 AM, Height: 61 in, Source: Stated, Weight: 108 lbs, Source: Stated, BMI: 20.4, Temperature: 97.6 F, Pulse: 78 bpm, Respiratory Rate: 18 breaths/min, Blood Pressure: 117/51 mmHg. Eyes conjunctiva clear no eyelid edema noted. pupils equal round and reactive to light and accommodation. Ears, Nose, Mouth, and Throat no gross abnormality of ear auricles or external auditory canals. normal hearing noted during conversation. mucus membranes moist. Respiratory normal breathing without  difficulty. Cardiovascular 2+ dorsalis pedis/posterior tibialis pulses. 1+ pitting edema of the bilateral lower extremities. Musculoskeletal normal gait and posture. no significant deformity or arthritic changes, no loss or range of motion, no clubbing. [name], [name] ([id number]) [id number] Page 7 of 10 Psychiatric this patient is able to make decisions and demonstrates good insight into disease process. Alert and Oriented x 3. pleasant and cooperative. General Notes: Upon inspection patient's wound bed actually showed signs of good granulation and epithelization at this point. Fortunately I do see some improvement with regard to some of the necrotic tissue which looks like it is actually clearing up nicely for her and then in some areas I still see some areas where we will get a need to perform some debridement to clearway the necrotic debris. Subsequently after obtaining consent I did actually perform debridement clearway slough and biofilm down to good subcutaneous tissue she tolerated that debridement today without complication and postdebridement the wound bed actually appears to be doing much better. Were not completely clean but it is significantly better compared to where we started. She is pleased with how things appear and here today. As Am I. Integumentary (Hair, Skin) Wound #1 status is Open. Original cause of wound was Skin T ear/Laceration. The date acquired was: 10/24/2022. The wound is located on the Right,Lateral Lower Leg. The wound measures 3.3cm length x 1.8cm width x 0.1cm depth; 4.665cm^2 area and 0.467cm^3 volume. There is Fat Layer (Subcutaneous Tissue) exposed. There is no tunneling or undermining  noted. There is a medium amount of serosanguineous drainage noted. There is no granulation within the wound bed. There is a large (67-100%) amount of necrotic tissue within the wound bed including Adherent Slough. Assessment Active  Problems ICD-10 Laceration without foreign body, right lower leg, initial encounter Chronic venous hypertension (idiopathic) with ulcer and inflammation of right lower extremity Non-pressure chronic ulcer of other part of right lower leg with fat layer exposed Paroxysmal atrial fibrillation Long term (current) use of anticoagulants Chronic obstructive pulmonary disease, unspecified Essential (primary) hypertension Atherosclerotic heart disease of native coronary artery without angina pectoris Chronic combined systolic (congestive) and diastolic (congestive) heart failure Procedures Wound #1 Pre-procedure diagnosis of Wound #1 is a Skin T located on the Right,Lateral Lower Leg . There was a Excisional Skin/Subcutaneous Tissue Debridement ear with a total area of 4.66 sq cm performed by Allen Derry, PA-C. With the following instrument(s): Curette to remove Viable and Non-Viable tissue/material. Material removed includes Subcutaneous Tissue, Slough, and Biofilm after achieving pain control using Lidocaine 4% T opical Solution. No specimens were taken. A time out was conducted at 09:24, prior to the start of the procedure. A Moderate amount of bleeding was controlled with Pressure. The procedure was tolerated well. Post Debridement Measurements: 3.3cm length x 1.8cm width x 0.1cm depth; 0.467cm^3 volume. Character of Wound/Ulcer Post Debridement is stable. Post procedure Diagnosis Wound #1: Same as Pre-Procedure Plan Follow-up Appointments: Return Appointment in 1 week. Bathing/ Shower/ Hygiene: Clean wound with Normal Saline or wound cleanser. May shower; gently cleanse wound with antibacterial soap, rinse and pat dry prior to dressing wounds No tub bath. Anesthetic (Use 'Patient Medications' Section for Anesthetic Order Entry): Lidocaine applied to wound bed Edema Control - Lymphedema / Segmental Compressive Device / Other: Elevate, Exercise Daily and Avoid Standing for Long Periods of  Time. Elevate leg(s) parallel to the floor when sitting. DO YOUR BEST to sleep in the bed at night. DO NOT sleep in your recliner. Long hours of sitting in a recliner leads to swelling of the legs and/or potential wounds on your backside. Services and Therapies ordered were: Ankle Brachial Index (ABI) - bilateral ABI/TBI WOUND #1: - Lower Leg Wound Laterality: Right, Lateral Cleanser: Byram Ancillary Kit - 15 Day Supply (DME) (Generic) 3 x Per Week/30 Days Discharge Instructions: Use supplies as instructed; Kit contains: (15) Saline Bullets; (15) 3x3 Gauze; 15 pr Gloves Cleanser: Soap and Water 3 x Per Week/30 Days Discharge Instructions: Gently cleanse wound with antibacterial soap, rinse and pat dry prior to dressing wounds Cleanser: Wound Cleanser 3 x Per Week/30 Days Discharge Instructions: Wash your hands with soap and water. Remove old dressing, discard into plastic bag and place into trash. Cleanse the wound with Wound Cleanser prior to applying a clean dressing using gauze sponges, not tissues or cotton balls. Do not scrub or use excessive force. Pat dry using gauze sponges, not tissue or cotton balls. Prim Dressing: Prisma 4.34 (in) (DME) (Generic) 3 x Per Week/30 Days ary Discharge Instructions: Moisten w/normal saline or sterile water; Cover wound as directed. Do not remove from wound bed. Secondary Dressing: (BORDER) Zetuvit Plus SILICONE BORDER Dressing 5x5 (in/in) (DME) (Generic) 3 x Per Week/30 Days Discharge Instructions: Please do not put silicone bordered dressings under wraps. Use non-bordered dressing only. [name], [name] ([id number]) [id number] Page 8 of 10 1. Based on what I am seeing I am going to recommend currently that we go ahead and initiate treatment with a silver collagen dressing which I think this  can be a really good option here. 2. I am also can recommend that we go ahead and utilize the Zetuvit border foam dressing to cover. 3. I  would also suggest that she should continue to change this 3 times per week which I think is good to be a good regimen. 4. Also can recommend that she should continue to elevate her legs is much as possible but not too much swelling but again if it starts to become too swollen we may need to initiate treatment with a compression wrap we will see how things do over the next week. We will see patient back for reevaluation in 1 week here in the clinic. If anything worsens or changes patient will contact our office for additional recommendations. Electronic Signature(s) Signed: 02/05/2023 4:53:50 PM By: Allen Derry PA-C Entered By: Allen Derry on 02/05/2023 16:53:50 -------------------------------------------------------------------------------- ROS/PFSH Details Patient Name: Date of Service: Jamie Davenport RA R. 02/04/2023 8:15 A M Medical Record Number: [id number] Patient Account Number: [id number] Date of Birth/Sex: Treating RN: [birth year] (88 y.o. [name] Primary Care Provider: Bari Edward Other Clinician: Referring Provider: Treating Provider/Extender: Francee Piccolo in Treatment: 0 Information Obtained From Patient Constitutional Symptoms (General Health) Complaints and Symptoms: Negative for: Fatigue; Fever; Chills; Marked Weight Change Eyes Complaints and Symptoms: Positive for: Glasses / Contacts Review of System Notes: macular degeneration Hematologic/Lymphatic Complaints and Symptoms: Negative for: Bleeding / Clotting Disorders; Human Immunodeficiency Virus Review of System Notes: pt on blood thinner Gastrointestinal Complaints and Symptoms: Negative for: Frequent diarrhea; Nausea; Vomiting Endocrine Complaints and Symptoms: Positive for: Thyroid disease - hypothyroidism Immunological Complaints and Symptoms: Negative for: Hives; Itching Integumentary (Skin) [name], PINGEL R ([id number]) [id number] Page 9 of  10 Complaints and Symptoms: Negative for: Wounds; Bleeding or bruising tendency; Breakdown; Swelling Neurologic Complaints and Symptoms: Negative for: Numbness/parasthesias; Focal/Weakness Psychiatric Complaints and Symptoms: Negative for: Anxiety; Claustrophobia Ear/Nose/Mouth/Throat Complaints and Symptoms: Review of System Notes: can only hear out of the left side Respiratory Complaints and Symptoms: Review of System Notes: wears 2L at all times Medical History: Positive for: Chronic Obstructive Pulmonary Disease (COPD) Cardiovascular Medical History: Positive for: Arrhythmia - a fib; Congestive Heart Failure; Coronary Artery Disease; Hypertension Genitourinary Complaints and Symptoms: Review of System Notes: chronic kidney stage 3 Musculoskeletal Medical History: Past Medical History Notes: arthritis Oncologic Complaints and Symptoms: Review of System Notes: breast CA Medical History: Positive for: Received Radiation - 2002 Immunizations Pneumococcal Vaccine: Received Pneumococcal Vaccination: Yes Received Pneumococcal Vaccination On or After 60th Birthday: Yes Implantable Devices Yes Family and Social History Former smoker - quit 25 yrs ago; Alcohol Use: Never; Drug Use: No History; Caffeine Use: Daily Electronic Signature(s) Signed: 02/04/2023 4:52:49 PM By: Angelina Pih Signed: 02/05/2023 5:03:24 PM By: Allen Derry PA-C Entered By: Angelina Pih on 02/04/2023 08:25:47 Merri Ray ([id number]) [id number] Page 10 of 10 -------------------------------------------------------------------------------- SuperBill Details Patient Name: Date of Service: SUSANNAH, KROLIK 02/04/2023 Medical Record Number: [id number] Patient Account Number: [id number] Date of Birth/Sex: Treating RN: 05-13-1935 (88 y.o. [name] Primary Care Provider: Bari Edward Other Clinician: Referring Provider: Treating Provider/Extender: Francee Piccolo in Treatment: 0 Diagnosis Coding ICD-10 Codes Code Description [phone] Laceration without foreign body, right lower leg, initial encounter I87.331 Chronic venous hypertension (idiopathic) with ulcer and inflammation of right lower extremity L97.812 Non-pressure chronic ulcer of other part of right lower leg with fat layer exposed I48.0 Paroxysmal atrial fibrillation Z79.01 Long term (current) use of anticoagulants J44.9 Chronic obstructive pulmonary  disease, unspecified I10 Essential (primary) hypertension 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 Facility Procedures : CPT4 Code: 40981191 Description: 99213 - WOUND CARE VISIT-LEV 3 EST PT Modifier: Quantity: 1 : CPT4 Code: 47829562 Description: 11042 - DEB SUBQ TISSUE 20 SQ CM/< ICD-10 Diagnosis Description L97.812 Non-pressure chronic ulcer of other part of right lower leg with fat layer expos Modifier: ed Quantity: 1 Physician Procedures : CPT4 Code Description Modifier [id number] WC PHYS LEVEL 3 NEW PT 25 ICD-10 Diagnosis Description S81.811A Laceration without foreign body, right lower leg, initial encounter I87.331 Chronic venous hypertension (idiopathic) with ulcer and inflammation of  right lower extremity L97.812 Non-pressure chronic ulcer of other part of right lower leg with fat layer exposed I48.0 Paroxysmal atrial fibrillation Quantity: 1 : [id number] 11042 - WC PHYS SUBQ TISS 20 SQ CM ICD-10 Diagnosis Description L97.812 Non-pressure chronic ulcer of other part of right lower leg with fat layer exposed Quantity: 1 Electronic Signature(s) Signed: 02/05/2023 4:54:14 PM By: Allen Derry PA-C Entered By: Allen Derry on 02/05/2023 16:54:14

## 2023-02-25 ENCOUNTER — Other Ambulatory Visit: Payer: Self-pay

## 2023-02-25 ENCOUNTER — Encounter: Payer: Self-pay | Admitting: Internal Medicine

## 2023-02-25 ENCOUNTER — Encounter: Payer: Medicare Other | Admitting: Physician Assistant

## 2023-02-25 ENCOUNTER — Ambulatory Visit: Payer: Medicare Other | Attending: Internal Medicine | Admitting: Internal Medicine

## 2023-02-25 VITALS — BP 112/60 | HR 74 | Ht 60.0 in | Wt 108.0 lb

## 2023-02-25 DIAGNOSIS — Z5181 Encounter for therapeutic drug level monitoring: Secondary | ICD-10-CM

## 2023-02-25 DIAGNOSIS — S81811A Laceration without foreign body, right lower leg, initial encounter: Secondary | ICD-10-CM | POA: Diagnosis not present

## 2023-02-25 DIAGNOSIS — I5041 Acute combined systolic (congestive) and diastolic (congestive) heart failure: Secondary | ICD-10-CM | POA: Diagnosis present

## 2023-02-25 DIAGNOSIS — I442 Atrioventricular block, complete: Secondary | ICD-10-CM

## 2023-02-25 DIAGNOSIS — Z950 Presence of cardiac pacemaker: Secondary | ICD-10-CM | POA: Diagnosis not present

## 2023-02-25 LAB — CUP PACEART INCLINIC DEVICE CHECK
Battery Remaining Longevity: 20 mo
Battery Voltage: 2.92 V
Brady Statistic AP VP Percent: 93.08 %
Brady Statistic AP VS Percent: 0.55 %
Brady Statistic AS VP Percent: 6.34 %
Brady Statistic AS VS Percent: 0.03 %
Brady Statistic RA Percent Paced: 93.07 %
Brady Statistic RV Percent Paced: 99.04 %
Date Time Interrogation Session: 20240618132106
Implantable Lead Connection Status: 753985
Implantable Lead Connection Status: 753985
Implantable Lead Implant Date: 20160823
Implantable Lead Implant Date: 20160823
Implantable Lead Location: 753859
Implantable Lead Location: 753860
Implantable Lead Model: 5076
Implantable Lead Model: 5076
Implantable Pulse Generator Implant Date: 20160823
Lead Channel Impedance Value: 342 Ohm
Lead Channel Impedance Value: 380 Ohm
Lead Channel Impedance Value: 380 Ohm
Lead Channel Impedance Value: 399 Ohm
Lead Channel Pacing Threshold Amplitude: 0.5 V
Lead Channel Pacing Threshold Amplitude: 0.625 V
Lead Channel Pacing Threshold Pulse Width: 0.4 ms
Lead Channel Pacing Threshold Pulse Width: 0.4 ms
Lead Channel Sensing Intrinsic Amplitude: 0.875 mV
Lead Channel Sensing Intrinsic Amplitude: 1.125 mV
Lead Channel Sensing Intrinsic Amplitude: 11 mV
Lead Channel Sensing Intrinsic Amplitude: 12.25 mV
Lead Channel Setting Pacing Amplitude: 1.5 V
Lead Channel Setting Pacing Amplitude: 2 V
Lead Channel Setting Pacing Pulse Width: 0.4 ms
Lead Channel Setting Sensing Sensitivity: 2.8 mV
Zone Setting Status: 755011

## 2023-02-25 MED ORDER — AMIODARONE HCL 200 MG PO TABS: ORAL_TABLET | ORAL | 3 refills | Status: DC

## 2023-02-25 NOTE — Progress Notes (Signed)
[name] ([id number]) [id number] Page 1 of 9 Visit Report for 02/25/2023 Arrival Information Details Patient Name: Date of Service: [name], [name] 02/25/2023 3:00 PM Medical Record Number: [id number] Patient Account Number: [id number] Date of Birth/Sex: Treating RN: 1935-06-15 (88 y.o. [name] Primary Care [name]: [name] Other Clinician: Referring [name]: Treating [name]/Extender: [name] in Treatment: 3 Visit Information History Since Last Visit Added or deleted any medications: No Patient Arrived: [name] Any new allergies or adverse reactions: No Arrival Time: 14:48 Had a fall or experienced change in No Accompanied By: SPOUSE activities of daily living that may affect Transfer Assistance: None risk of falls: Patient Identification Verified: Yes Hospitalized since last visit: No Secondary Verification Process Completed: Yes Has Dressing in Place as Prescribed: Yes Patient Has Alerts: Yes Pain Present Now: Yes Patient Alerts: Patient on Blood Thinner PACEMAKER 02/13/23 ABI [name] 02/13/23 ABI L 1.26 02/13/23 TBI [name] 0.91 02/13/23 TBI L 0.86 Electronic Signature(s) Signed: 02/25/2023 3:28:11 PM By: [name] Entered By: [name] on 02/25/2023 15:27:28 -------------------------------------------------------------------------------- Clinic Level of Care Assessment Details Patient Name: Date of Service: [name], [name] 02/25/2023 3:00 PM Medical Record Number: [id number] Patient Account Number: [id number] Date of Birth/Sex: Treating RN: 1935/06/22 (88 y.o. [name] Primary Care [name]: [name] Other Clinician: Referring [name]: Treating Azriella Mattia/Extender: [name] in Treatment: 3 Clinic Level of Care Assessment Items TOOL 1 Quantity Score []  - 0 Use when EandM and Procedure is performed on INITIAL visit ASSESSMENTS - Nursing Assessment /  Reassessment []  - 0 General Physical Exam (combine w/ comprehensive assessment (listed just below) when performed on new pt. evals) []  - 0 Comprehensive Assessment (HX, ROS, Risk Assessments, Wounds Hx, etc.) [name], [name] ([id number]) [phone] Page 2 of 9 ASSESSMENTS - Wound and Skin Assessment / Reassessment []  - 0 Dermatologic / Skin Assessment (not related to wound area) ASSESSMENTS - Ostomy and/or Continence Assessment and Care []  - 0 Incontinence Assessment and Management []  - 0 Ostomy Care Assessment and Management (repouching, etc.) PROCESS - Coordination of Care []  - 0 Simple Patient / Family Education for ongoing care []  - 0 Complex (extensive) Patient / Family Education for ongoing care []  - 0 Staff obtains Chiropractor, Records, T Results / Process Orders est []  - 0 Staff telephones HHA, Nursing Homes / Clarify orders / etc []  - 0 Routine Transfer to another Facility (non-emergent condition) []  - 0 Routine Hospital Admission (non-emergent condition) []  - 0 New Admissions / Manufacturing engineer / Ordering NPWT Apligraf, etc. , []  - 0 Emergency Hospital Admission (emergent condition) PROCESS - Special Needs []  - 0 Pediatric / Minor Patient Management []  - 0 Isolation Patient Management []  - 0 Hearing / Language / Visual special needs []  - 0 Assessment of Community assistance (transportation, D/C planning, etc.) []  - 0 Additional assistance / Altered mentation []  - 0 Support Surface(s) Assessment (bed, cushion, seat, etc.) INTERVENTIONS - Miscellaneous []  - 0 External ear exam []  - 0 Patient Transfer (multiple staff / Nurse, adult / Similar devices) []  - 0 Simple Staple / Suture removal (25 or less) []  - 0 Complex Staple / Suture removal (26 or more) []  - 0 Hypo/Hyperglycemic Management (do not check if billed separately) []  - 0 Ankle / Brachial Index (ABI) - do not check if billed separately Has the patient been seen at  the hospital within the last three years: Yes Total Score: 0 Level Of Care: ____ Electronic Signature(s)  Signed: 02/25/2023 3:28:11 PM By: [name] Entered By: [name] on 02/25/2023 15:17:39 -------------------------------------------------------------------------------- Encounter Discharge Information Details Patient Name: Date of Service: Jamie Davenport RA [name]. 02/25/2023 3:00 PM Medical Record Number: [id number] Patient Account Number: [id number] Date of Birth/Sex: Treating RN: 10/11/1934 (88 y.o. [name] Primary Care Beckhem Isadore: [name] Other Clinician: Referring [name]: Treating [name]/Extender: [name] in Treatment: 3 [place], [name] ([id number]) [id number] Page 3 of 9 Encounter Discharge Information Items Post Procedure Vitals Discharge Condition: Stable Temperature (F): 97.5 Ambulatory Status: Cane Pulse (bpm): 79 Discharge Destination: Home Respiratory Rate (breaths/min): 18 Transportation: Private Auto Blood Pressure (mmHg): 103/51 Accompanied By: [name] Schedule Follow-up Appointment: Yes Clinical Summary of Care: Electronic Signature(s) Signed: 02/25/2023 3:28:11 PM By: [name] Entered By: [name] on 02/25/2023 15:19:10 -------------------------------------------------------------------------------- Lower Extremity Assessment Details Patient Name: Date of Service: [name] 02/25/2023 3:00 PM Medical Record Number: [id number] Patient Account Number: [id number] Date of Birth/Sex: Treating RN: 04/30/1935 (88 y.o. [name] Primary Care Irving Lubbers: [name] Other Clinician: Referring Bless Belshe: Treating Tyree Vandruff/Extender: [name] in Treatment: 3 Edema Assessment Assessed: [Left: No] [Right: No] Edema: [Left: N] [Right: o] Calf Left: Right: Point of Measurement: 32 cm From Medial Instep 28.8 cm Ankle Left: Right: Point of  Measurement: 11 cm From Medial Instep 18.2 cm Vascular Assessment Pulses: Dorsalis Pedis Palpable: [Right:Yes] Electronic Signature(s) Signed: 02/25/2023 3:28:11 PM By: [name] Entered By: [name] on 02/25/2023 14:55:59 [name], [name] ([id number]) [id number] Page 4 of 9 -------------------------------------------------------------------------------- Multi Wound Chart Details Patient Name: Date of Service: [name], [name] 02/25/2023 3:00 PM Medical Record Number: [id number] Patient Account Number: [id number] Date of Birth/Sex: Treating RN: 06-04-1935 (89 y.o. [name] Primary Care Alize Acy: [name] Other Clinician: Referring Coburn Knaus: Treating Caroleena Paolini/Extender: [name] in Treatment: 3 Vital Signs Height(in): 61 Pulse(bpm): 79 Weight(lbs): 108 Blood Pressure(mmHg): 103/51 Body Mass Index(BMI): 20.4 Temperature(F): 97.5 Respiratory Rate(breaths/min): 18 [1:Photos:] [N/A:N/A] Right, Lateral Lower Leg N/A N/A Wound Location: Skin Tear/Laceration N/A N/A Wounding Event: Skin Tear N/A N/A Primary Etiology: Chronic Obstructive Pulmonary N/A N/A Comorbid History: Disease (COPD), Arrhythmia, Congestive Heart Failure, Coronary Artery Disease, Hypertension, Received Radiation 10/24/2022 N/A N/A Date Acquired: 3 N/A N/A [name] of Treatment: Open N/A N/A Wound Status: No N/A N/A Wound Recurrence: 3.2x1.6x0.1 N/A N/A Measurements L x W x D (cm) 4.021 N/A N/A A (cm) : rea 0.402 N/A N/A Volume (cm) : 13.80% N/A N/A % Reduction in Area: 13.90% N/A N/A % Reduction in Volume: Full Thickness Without Exposed N/A N/A Classification: Support Structures Medium N/A N/A Exudate A mount: Serosanguineous N/A N/A Exudate Type: red, brown N/A N/A Exudate Color: Medium (34-66%) N/A N/A Granulation A mount: Pink N/A N/A Granulation Quality: Medium (34-66%) N/A N/A Necrotic A mount: Fat Layer  (Subcutaneous Tissue): Yes N/A N/A Exposed Structures: None N/A N/A Epithelialization: Treatment Notes Electronic Signature(s) Signed: 02/25/2023 3:28:11 PM By: [name] Entered By: [name] on 02/25/2023 15:14:00 Carma Lair [name] ([id number]) [id number] Page 5 of 9 -------------------------------------------------------------------------------- Multi-Disciplinary Care Plan Details Patient Name: Date of Service: [name], [name] 02/25/2023 3:00 PM Medical Record Number: [id number] Patient Account Number: [id number] Date of Birth/Sex: Treating RN: [birth year] (88 y.o. [name] Primary Care Troyce Febo: [name] Other Clinician: Referring Isahi Godwin: Treating Gilman Olazabal/Extender: [name] in Treatment: 3 Active Inactive Necrotic Tissue Nursing Diagnoses: Impaired tissue integrity related to necrotic/devitalized tissue Goals: Necrotic/devitalized tissue will be minimized in the  wound bed Date Initiated: 02/04/2023 Target Resolution Date: 03/04/2023 Goal Status: Active Patient/caregiver will verbalize understanding of reason and process for debridement of necrotic tissue Date Initiated: 02/04/2023 Date Inactivated: 02/04/2023 Target Resolution Date: 02/04/2023 Goal Status: Met Interventions: Assess patient pain level pre-, during and post procedure and prior to discharge Provide education on necrotic tissue and debridement process Treatment Activities: Excisional debridement : 02/04/2023 Notes: Venous Leg Ulcer Nursing Diagnoses: Potential for venous Insuffiency (use before diagnosis confirmed) Goals: Non-invasive venous studies are completed as ordered Date Initiated: 02/04/2023 Date Inactivated: 02/18/2023 Target Resolution Date: 02/18/2023 Goal Status: Met Patient will maintain optimal edema control Date Initiated: 02/04/2023 Target Resolution Date: 03/04/2023 Goal Status: Active Patient/caregiver will  verbalize understanding of disease process and disease management Date Initiated: 02/04/2023 Date Inactivated: 02/25/2023 Target Resolution Date: 02/11/2023 Goal Status: Met Verify adequate tissue perfusion prior to therapeutic compression application Date Initiated: 02/04/2023 Target Resolution Date: 02/18/2023 Goal Status: Active Interventions: Assess peripheral edema status every visit. Provide education on venous insufficiency Notes: Wound/Skin Impairment Nursing Diagnoses: Impaired tissue integrity Knowledge deficit related to ulceration/compromised skin integrity [name], GOERTZEN [name] ([id number]) [phone].pdf Page 6 of 9 Goals: Ulcer/skin breakdown will have a volume reduction of 30% by week 4 Date Initiated: 02/04/2023 Target Resolution Date: 03/04/2023 Goal Status: Active Ulcer/skin breakdown will have a volume reduction of 50% by week 8 Date Initiated: 02/04/2023 Target Resolution Date: 04/01/2023 Goal Status: Active Ulcer/skin breakdown will have a volume reduction of 80% by week 12 Date Initiated: 02/04/2023 Target Resolution Date: 04/29/2023 Goal Status: Active Ulcer/skin breakdown will heal within 14 [name] Date Initiated: 02/04/2023 Target Resolution Date: 05/13/2023 Goal Status: Active Interventions: Assess patient/caregiver ability to obtain necessary supplies Assess patient/caregiver ability to perform ulcer/skin care regimen upon admission and as needed Assess ulceration(s) every visit Provide education on ulcer and skin care Treatment Activities: Skin care regimen initiated : 02/04/2023 Notes: Electronic Signature(s) Signed: 02/25/2023 3:28:11 PM By: [name] Entered By: [name] on 02/25/2023 15:18:01 -------------------------------------------------------------------------------- Pain Assessment Details Patient Name: Date of Service: [name], [name] 02/25/2023 3:00 PM Medical Record Number: [id number] Patient Account Number:  [id number] Date of Birth/Sex: Treating RN: 06-May-1935 (88 y.o. [name] Primary Care Yug Loria: [name] Other Clinician: Referring Mckinnley Smithey: Treating Trinadee Verhagen/Extender: [name] in Treatment: 3 Active Problems Location of Pain Severity and Description of Pain Patient Has Paino Yes Site Locations Rate the pain. Current Pain Level: 7 [name], [name] ([id number]) [id number] Page 7 of 9 Pain Management and Medication Current Pain Management: Electronic Signature(s) Signed: 02/25/2023 3:28:11 PM By: [name] Entered By: [name] on 02/25/2023 14:50:50 -------------------------------------------------------------------------------- Patient/Caregiver Education Details Patient Name: Date of Service: Westley Hummer 6/18/2024andnbsp3:00 PM Medical Record Number: [id number] Patient Account Number: [id number] Date of Birth/Gender: Treating RN: 25-Nov-1934 (88 y.o. [name] Primary Care Physician: [name] Other Clinician: Referring Physician: Treating Physician/Extender: [name] in Treatment: 3 Education Assessment Education Provided To: Patient Education Topics Provided Wound Debridement: Handouts: Wound Debridement Methods: Explain/Verbal Responses: State content correctly Wound/Skin Impairment: Handouts: Caring for Your Ulcer Methods: Explain/Verbal Responses: State content correctly Electronic Signature(s) Signed: 02/25/2023 3:28:11 PM By: [name] Entered By: [name] on 02/25/2023 15:18:13 -------------------------------------------------------------------------------- Wound Assessment Details Patient Name: Date of Service: [name], [name] 02/25/2023 3:00 PM Medical Record Number: [id number] Patient Account Number: [id number] Date of Birth/Sex: Treating RN: 02-28-1935 (88 y.o. [name] Primary Care Emmelia Holdsworth: [name] Other  Clinician: Referring Sharlot Sturkey: Treating Tritia Endo/Extender: Jamie Pearson  KALAI, TESH [name] ([id number]) [id number] Page 8 of 9 [name] in Treatment: 3 Wound Status Wound Number: 1 Primary Skin T ear Etiology: Wound Location: Right, Lateral Lower Leg Wound Open Wounding Event: Skin Tear/Laceration Status: Date Acquired: 10/24/2022 Comorbid Chronic Obstructive Pulmonary Disease (COPD), Arrhythmia, [name] Of Treatment: 3 History: Congestive Heart Failure, Coronary Artery Disease, Hypertension, Clustered Wound: No Received Radiation Photos Wound Measurements Length: (cm) 3.2 Width: (cm) 1.6 Depth: (cm) 0.1 Area: (cm) 4.021 Volume: (cm) 0.402 % Reduction in Area: 13.8% % Reduction in Volume: 13.9% Epithelialization: None Tunneling: No Undermining: No Wound Description Classification: Full Thickness Without Exposed Suppo Exudate Amount: Medium Exudate Type: Serosanguineous Exudate Color: red, brown rt Structures Foul Odor After Cleansing: No Slough/Fibrino Yes Wound Bed Granulation Amount: Medium (34-66%) Exposed Structure Granulation Quality: Pink Fat Layer (Subcutaneous Tissue) Exposed: Yes Necrotic Amount: Medium (34-66%) Necrotic Quality: Adherent Slough Treatment Notes Wound #1 (Lower Leg) Wound Laterality: Right, Lateral Cleanser [place]ram Ancillary Kit - 15 Day Supply Discharge Instruction: Use supplies as instructed; Kit contains: (15) Saline Bullets; (15) 3x3 Gauze; 15 pr Gloves Soap and Water Discharge Instruction: Gently cleanse wound with antibacterial soap, rinse and pat dry prior to dressing wounds Wound Cleanser Discharge Instruction: Wash your hands with soap and water. Remove old dressing, discard into plastic bag and place into trash. Cleanse the wound with Wound Cleanser prior to applying a clean dressing using gauze sponges, not tissues or cotton balls. Do not scrub or use excessive force. Pat dry using gauze sponges, not  tissue or cotton balls. Peri-Wound Care Topical Primary Dressing Hydrofera Blue Ready Transfer Foam, 2.5x2.5 (in/in) Discharge Instruction: Apply Hydrofera Blue Ready to wound bed as directed Secondary Dressing (BORDER) Zetuvit Plus SILICONE BORDER Dressing 5x5 (in/in) Discharge Instruction: Please do not put silicone bordered dressings under wraps. Use non-bordered dressing only. Secured With Compression BOBBIEJEAN, MAFI [name] ([id number]) [id number] Page 9 of 9 Compression Stockings Add-Ons Electronic Signature(s) Signed: 02/25/2023 3:28:11 PM By: [name] Entered By: [name] on 02/25/2023 14:58:36 -------------------------------------------------------------------------------- Vitals Details Patient Name: Date of Service: Jamie Davenport RA [name]. 02/25/2023 3:00 PM Medical Record Number: [id number] Patient Account Number: [id number] Date of Birth/Sex: Treating RN: Dec 01, 1934 (88 y.o. [name] Primary Care Serge Main: [name] Other Clinician: Referring Wendee Hata: Treating Korde Jeppsen/Extender: [name] in Treatment: 3 Vital Signs Time Taken: 14:48 Temperature (F): 97.5 Height (in): 61 Pulse (bpm): 79 Weight (lbs): 108 Respiratory Rate (breaths/min): 18 Body Mass Index (BMI): 20.4 Blood Pressure (mmHg): 103/51 Reference Range: 80 - 120 mg / dl Electronic Signature(s) Signed: 02/25/2023 3:28:11 PM By: [name] Entered By: [name] on 02/25/2023 14:50:42

## 2023-02-25 NOTE — Progress Notes (Addendum)
Jamie, [name] ([id number]) [id number] Page 1 of 7 Visit Report for 02/25/2023 Chief Complaint Document Details Patient Name: Date of Service: [name] 02/25/2023 3:00 PM Medical Record Number: [id number] Patient Account Number: [id number] Date of Birth/Sex: Treating RN: 12-14-1934 (88 y.o. [name] Primary Care Provider: Bari Edward Other Clinician: Referring Provider: Treating Provider/Extender: Francee Piccolo in Treatment: 3 Information Obtained from: Patient Chief Complaint Right LE Ulcer Electronic Signature(s) Signed: 02/25/2023 3:12:11 PM By: Allen Derry PA-C Entered By: Allen Derry on 02/25/2023 15:12:11 -------------------------------------------------------------------------------- Debridement Details Patient Name: Date of Service: Jamie Davenport RA [name]. 02/25/2023 3:00 PM Medical Record Number: [id number] Patient Account Number: [id number] Date of Birth/Sex: Treating RN: 07/01/1935 (88 y.o. [name] Primary Care Provider: Bari Edward Other Clinician: Referring Provider: Treating Provider/Extender: Francee Piccolo in Treatment: 3 Debridement Performed for Assessment: Wound #1 Right,Lateral Lower Leg Performed By: Physician Allen Derry, PA-C Debridement Type: Debridement Level of Consciousness (Pre-procedure): Awake and Alert Pre-procedure Verification/Time Out Yes - 15:16 Taken: Pain Control: Lidocaine 4% T opical Solution Percent of Wound Bed Debrided: 100% T Area Debrided (cm): otal 4.02 Tissue and other material debrided: Viable, Non-Viable, Slough, Subcutaneous, Slough Level: Skin/Subcutaneous Tissue Debridement Description: Excisional Instrument: Curette Bleeding: Moderate Hemostasis Achieved: Pressure Response to Treatment: Procedure was tolerated well Level of Consciousness (Post- Awake and Alert procedure): Jamie, [name] ([id number])  [id number] Page 2 of 7 Post Debridement Measurements of Total Wound Length: (cm) 3.2 Width: (cm) 1.6 Depth: (cm) 0.2 Volume: (cm) 0.804 Character of Wound/Ulcer Post Debridement: Stable Post Procedure Diagnosis Same as Pre-procedure Electronic Signature(s) Signed: 02/25/2023 3:28:11 PM By: Angelina Pih Signed: 02/27/2023 5:52:14 PM By: Allen Derry PA-C Entered By: Angelina Pih on 02/25/2023 15:18:26 -------------------------------------------------------------------------------- HPI Details Patient Name: Date of Service: Jamie Davenport RA [name]. 02/25/2023 3:00 PM Medical Record Number: [id number] Patient Account Number: [id number] Date of Birth/Sex: Treating RN: 1935/03/06 (88 y.o. [name] Primary Care Provider: Bari Edward Other Clinician: Referring Provider: Treating Provider/Extender: Francee Piccolo in Treatment: 3 History of Present Illness HPI Description: 02-04-2023 patient presents today for initial evaluation here in our clinic concerning a wound which actually began around October 24, 2022. With that being said she notes that this was due to an injury and then has continued to be a problem not wanting to close and heal as quickly as would be expected. She is on long-term anticoagulant therapy due to atrial fibrillation. She also has hypertension, COPD, coronary artery disease, congestive heart failure, and chronic venous insufficiency bilaterally. With that being said I do believe that this wound is currently showing some signs of improvement I do not see any obvious evidence of infection right now though that something we will keep a close eye on she has been on Ceftin this was December 18, 2022. She is not on that currently. Again she is on Coumadin. 02-11-2023 upon evaluation today patient appears to be doing decently well currently in regard to her leg wound. She is still is having some buildup of slough and necrotic  debris at this time. With that being said I do think that overall for 1 week's time she has done decently well. 02-18-2023 upon evaluation today patient appears to be doing well currently in regard to her wound although there is slough and biofilm buildup she has continued to have issues here with is slowly making progress towards healing. Fortunately I do not see any signs of active infection locally nor systemically at  this time. No fevers, chills, nausea, vomiting, or diarrhea. 02-25-2023 upon evaluation patient appears to be doing pretty well currently in regard to her wound which is showing signs of improvement slowly but surely. Fortunately I do not see any evidence of active infection locally nor systemically which is great news. No fevers, chills, nausea, vomiting, or diarrhea. Electronic Signature(s) Signed: 02/28/2023 7:45:11 AM By: Allen Derry PA-C Entered By: Allen Derry on 02/28/2023 07:45:11 [name], [name] ([id number]) [id number] Page 3 of 7 -------------------------------------------------------------------------------- Physical Exam Details Patient Name: Date of Service: Jamie, [name] 02/25/2023 3:00 PM Medical Record Number: [id number] Patient Account Number: [id number] Date of Birth/Sex: Treating RN: 01/28/1935 (88 y.o. [name] Primary Care Provider: Bari Edward Other Clinician: Referring Provider: Treating Provider/Extender: Francee Piccolo in Treatment: 3 Constitutional Well-nourished and well-hydrated in no acute distress. Respiratory normal breathing without difficulty. Psychiatric this patient is able to make decisions and demonstrates good insight into disease process. Alert and Oriented x 3. pleasant and cooperative. Notes Upon inspection patient's wound bed actually showed signs of good granulation naturalization at this point. Fortunately I do not see any signs of worsening overall and in general I do  believe that we are making good headway towards closure here this is just a slow process but nonetheless does seem to be improving. I do not see any evidence of infection locally nor systemically which is great news. Electronic Signature(s) Signed: 02/28/2023 7:45:37 AM By: Allen Derry PA-C Entered By: Allen Derry on 02/28/2023 07:45:37 -------------------------------------------------------------------------------- Physician Orders Details Patient Name: Date of Service: Jamie Davenport RA [name]. 02/25/2023 3:00 PM Medical Record Number: [id number] Patient Account Number: [id number] Date of Birth/Sex: Treating RN: 08/28/1935 (88 y.o. [name] Primary Care Provider: Bari Edward Other Clinician: Referring Provider: Treating Provider/Extender: Francee Piccolo in Treatment: 3 Verbal / Phone Orders: No Diagnosis Coding ICD-10 Coding Code Description [id number] Laceration without foreign body, right lower leg, initial encounter I87.331 Chronic venous hypertension (idiopathic) with ulcer and inflammation of right lower extremity L97.812 Non-pressure chronic ulcer of other part of right lower leg with fat layer exposed I48.0 Paroxysmal atrial fibrillation Z79.01 Long term (current) use of anticoagulants J44.9 Chronic obstructive pulmonary disease, unspecified I10 Essential (primary) hypertension 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 Follow-up Appointments Return Appointment in 1 week. Bathing/ Shower/ Hygiene Clean wound with Normal Saline or wound cleanser. May shower; gently cleanse wound with antibacterial soap, rinse and pat dry prior to dressing wounds No tub bath. Jamie, [name] ([id number]) [id number] Page 4 of 7 Anesthetic (Use 'Patient Medications' Section for Anesthetic Order Entry) Lidocaine applied  to wound bed Cellular or Tissue Based Products Cellular or Tissue Based Product Type: - apligraf to be ordered for 03/03/23 Edema Control - Lymphedema / Segmental Compressive Device / Other Elevate, Exercise Daily and A void Standing for Long Periods of Time. Elevate leg(s) parallel to the floor when sitting. DO YOUR BEST to sleep in the bed at night. DO NOT sleep in your recliner. Long hours of sitting in a recliner leads to swelling of the legs and/or potential wounds on your backside. Wound Treatment Wound #1 - Lower Leg Wound Laterality: Right, Lateral Cleanser: Byram Ancillary Kit - 15 Day Supply (Generic) 3 x Per Week/30 Days Discharge Instructions: Use supplies as instructed; Kit contains: (15) Saline Bullets; (15) 3x3 Gauze; 15 pr Gloves Cleanser: Soap and Water 3 x Per Week/30  Days Discharge Instructions: Gently cleanse wound with antibacterial soap, rinse and pat dry prior to dressing wounds Cleanser: Wound Cleanser 3 x Per Week/30 Days Discharge Instructions: Wash your hands with soap and water. Remove old dressing, discard into plastic bag and place into trash. Cleanse the wound with Wound Cleanser prior to applying a clean dressing using gauze sponges, not tissues or cotton balls. Do not scrub or use excessive force. Pat dry using gauze sponges, not tissue or cotton balls. Prim Dressing: Hydrofera Blue Ready Transfer Foam, 2.5x2.5 (in/in) 3 x Per Week/30 Days ary Discharge Instructions: Apply Hydrofera Blue Ready to wound bed as directed Secondary Dressing: (BORDER) Zetuvit Plus SILICONE BORDER Dressing 5x5 (in/in) (Generic) 3 x Per Week/30 Days Discharge Instructions: Please do not put silicone bordered dressings under wraps. Use non-bordered dressing only. Electronic Signature(s) Signed: 02/25/2023 4:02:15 PM By: Angelina Pih Signed: 02/27/2023 5:52:14 PM By: Allen Derry PA-C Previous Signature: 02/25/2023 3:28:11 PM Version By: Angelina Pih Entered By: Angelina Pih on  02/25/2023 15:43:26 -------------------------------------------------------------------------------- Problem List Details Patient Name: Date of Service: Jamie Davenport RA [name]. 02/25/2023 3:00 PM Medical Record Number: [id number] Patient Account Number: [id number] Date of Birth/Sex: Treating RN: 02-06-1935 (88 y.o. [name] Primary Care Provider: Bari Edward Other Clinician: Referring Provider: Treating Provider/Extender: Francee Piccolo in Treatment: 3 Active Problems ICD-10 Encounter Code Description Active Date MDM Diagnosis S81.811A Laceration without foreign body, right lower leg, initial encounter 02/04/2023 No Yes I87.331 Chronic venous hypertension (idiopathic) with ulcer and inflammation of right 02/04/2023 No Yes lower extremity L97.812 Non-pressure chronic ulcer of other part of right lower leg with fat layer 02/04/2023 No Yes Jamie, HARTSOUGH [name] ([id number]) [id number] Page 5 of 7 exposed I48.0 Paroxysmal atrial fibrillation 02/04/2023 No Yes Z79.01 Long term (current) use of anticoagulants 02/04/2023 No Yes J44.9 Chronic obstructive pulmonary disease, unspecified 02/04/2023 No Yes I10 Essential (primary) hypertension 02/04/2023 No Yes I25.10 Atherosclerotic heart disease of native coronary artery without angina pectoris 02/04/2023 No Yes I50.42 Chronic combined systolic (congestive) and diastolic (congestive) heart failure 02/04/2023 No Yes Inactive Problems Resolved Problems Electronic Signature(s) Signed: 02/25/2023 3:12:08 PM By: Allen Derry PA-C Entered By: Allen Derry on 02/25/2023 15:12:08 -------------------------------------------------------------------------------- Progress Note Details Patient Name: Date of Service: Jamie Davenport RA [name]. 02/25/2023 3:00 PM Medical Record Number: [id number] Patient Account Number: [id number] Date of Birth/Sex: Treating RN: [birth year] (88 y.o. [name] Primary Care Provider: Bari Edward Other Clinician: Referring Provider: Treating Provider/Extender: Francee Piccolo in Treatment: 3 Subjective Chief Complaint Information obtained from Patient Right LE Ulcer History of Present Illness (HPI) 02-04-2023 patient presents today for initial evaluation here in our clinic concerning a wound which actually began around October 24, 2022. With that being said she notes that this was due to an injury and then has continued to be a problem not wanting to close and heal as quickly as would be expected. She is on long-term anticoagulant therapy due to atrial fibrillation. She also has hypertension, COPD, coronary artery disease, congestive heart failure, and chronic venous insufficiency bilaterally. With that being said I do believe that this wound is currently showing some signs of improvement I do not see any obvious evidence of infection right now though that something we will keep a close eye on she has been on Ceftin this was December 18, 2022. She is not on that currently. Again she is on Coumadin. 02-11-2023 upon evaluation today patient appears to be doing decently well currently in regard to her leg  wound. She is still is having some buildup of slough and necrotic debris at this time. With that being said I do think that overall for 1 week's time she has done decently well. Jamie, [name] ([id number]) [id number] Page 6 of 7 02-18-2023 upon evaluation today patient appears to be doing well currently in regard to her wound although there is slough and biofilm buildup she has continued to have issues here with is slowly making progress towards healing. Fortunately I do not see any signs of active infection locally nor systemically at this time. No fevers, chills, nausea, vomiting, or diarrhea. 02-25-2023 upon evaluation patient appears to be doing pretty well currently in regard to her wound which is showing signs of improvement slowly but  surely. Fortunately I do not see any evidence of active infection locally nor systemically which is great news. No fevers, chills, nausea, vomiting, or diarrhea. Objective Constitutional Well-nourished and well-hydrated in no acute distress. Vitals Time Taken: 2:48 PM, Height: 61 in, Weight: 108 lbs, BMI: 20.4, Temperature: 97.5 F, Pulse: 79 bpm, Respiratory Rate: 18 breaths/min, Blood Pressure: 103/51 mmHg. Respiratory normal breathing without difficulty. Psychiatric this patient is able to make decisions and demonstrates good insight into disease process. Alert and Oriented x 3. pleasant and cooperative. General Notes: Upon inspection patient's wound bed actually showed signs of good granulation naturalization at this point. Fortunately I do not see any signs of worsening overall and in general I do believe that we are making good headway towards closure here this is just a slow process but nonetheless does seem to be improving. I do not see any evidence of infection locally nor systemically which is great news. Integumentary (Hair, Skin) Wound #1 status is Open. Original cause of wound was Skin T ear/Laceration. The date acquired was: 10/24/2022. The wound has been in treatment 3 weeks. The wound is located on the Right,Lateral Lower Leg. The wound measures 3.2cm length x 1.6cm width x 0.1cm depth; 4.021cm^2 area and 0.402cm^3 volume. There is Fat Layer (Subcutaneous Tissue) exposed. There is no tunneling or undermining noted. There is a medium amount of serosanguineous drainage noted. There is medium (34-66%) pink granulation within the wound bed. There is a medium (34-66%) amount of necrotic tissue within the wound bed including Adherent Slough. Assessment Active Problems ICD-10 Laceration without foreign body, right lower leg, initial encounter Chronic venous hypertension (idiopathic) with ulcer and inflammation of right lower extremity Non-pressure chronic ulcer of other part of right  lower leg with fat layer exposed Paroxysmal atrial fibrillation Long term (current) use of anticoagulants Chronic obstructive pulmonary disease, unspecified Essential (primary) hypertension Atherosclerotic heart disease of native coronary artery without angina pectoris Chronic combined systolic (congestive) and diastolic (congestive) heart failure Procedures Wound #1 Pre-procedure diagnosis of Wound #1 is a Skin T located on the Right,Lateral Lower Leg . There was a Excisional Skin/Subcutaneous Tissue Debridement ear with a total area of 4.02 sq cm performed by Allen Derry, PA-C. With the following instrument(s): Curette to remove Viable and Non-Viable tissue/material. Material removed includes Subcutaneous Tissue and Slough and after achieving pain control using Lidocaine 4% T opical Solution. No specimens were taken. A time out was conducted at 15:16, prior to the start of the procedure. A Moderate amount of bleeding was controlled with Pressure. The procedure was tolerated well. Post Debridement Measurements: 3.2cm length x 1.6cm width x 0.2cm depth; 0.804cm^3 volume. Character of Wound/Ulcer Post Debridement is stable. Post procedure Diagnosis Wound #1: Same as Pre-Procedure Plan Follow-up Appointments: Return Appointment  in 1 week. Bathing/ Shower/ Hygiene: [name], [name] ([id number]) [id number] Page 7 of 7 Clean wound with Normal Saline or wound cleanser. May shower; gently cleanse wound with antibacterial soap, rinse and pat dry prior to dressing wounds No tub bath. Anesthetic (Use 'Patient Medications' Section for Anesthetic Order Entry): Lidocaine applied to wound bed Cellular or Tissue Based Products: Cellular or Tissue Based Product Type: - apligraf to be ordered for 03/03/23 Edema Control - Lymphedema / Segmental Compressive Device / Other: Elevate, Exercise Daily and Avoid Standing for Long Periods of Time. Elevate leg(s) parallel to the floor  when sitting. DO YOUR BEST to sleep in the bed at night. DO NOT sleep in your recliner. Long hours of sitting in a recliner leads to swelling of the legs and/or potential wounds on your backside. WOUND #1: - Lower Leg Wound Laterality: Right, Lateral Cleanser: Byram Ancillary Kit - 15 Day Supply (Generic) 3 x Per Week/30 Days Discharge Instructions: Use supplies as instructed; Kit contains: (15) Saline Bullets; (15) 3x3 Gauze; 15 pr Gloves Cleanser: Soap and Water 3 x Per Week/30 Days Discharge Instructions: Gently cleanse wound with antibacterial soap, rinse and pat dry prior to dressing wounds Cleanser: Wound Cleanser 3 x Per Week/30 Days Discharge Instructions: Wash your hands with soap and water. Remove old dressing, discard into plastic bag and place into trash. Cleanse the wound with Wound Cleanser prior to applying a clean dressing using gauze sponges, not tissues or cotton balls. Do not scrub or use excessive force. Pat dry using gauze sponges, not tissue or cotton balls. Prim Dressing: Hydrofera Blue Ready Transfer Foam, 2.5x2.5 (in/in) 3 x Per Week/30 Days ary Discharge Instructions: Apply Hydrofera Blue Ready to wound bed as directed Secondary Dressing: (BORDER) Zetuvit Plus SILICONE BORDER Dressing 5x5 (in/in) (Generic) 3 x Per Week/30 Days Discharge Instructions: Please do not put silicone bordered dressings under wraps. Use non-bordered dressing only. 1. I am going to recommend that we have the patient continue with the wound care measures as before. We have been using currently the [facility] along with the Zetuvit which I think is doing quite well. 2. I am also can recommend the patient should continue to monitor for any signs of infection or worsening. Obviously based on what I am seeing I do believe that we are making pretty good headway here towards closure and hopeful that this will continue to show signs of improvement as we go forward. We will see patient back for  reevaluation in 1 week here in the clinic. If anything worsens or changes patient will contact our office for additional recommendations. Electronic Signature(s) Signed: 02/28/2023 7:46:19 AM By: Allen Derry PA-C Entered By: Allen Derry on 02/28/2023 07:46:19

## 2023-02-25 NOTE — Patient Instructions (Addendum)
Device Clinic Number: [phone]  Medication Instructions:  Your physician has recommended you make the following change in your medication:  1) DECREASE amiodarone to 5 days per week  *If you need a refill on your cardiac medications before your next appointment, please call your pharmacy*   Lab Work: Please have your PCP check a TSH and CMET  Follow-Up: At [facility], you and your health needs are our priority.  As part of our continuing mission to provide you with exceptional heart care, we have created designated Provider Care Teams.  These Care Teams include your primary Cardiologist (physician) and Advanced Practice Providers (APPs -  Physician Assistants and Nurse Practitioners) who all work together to provide you with the care you need, when you need it.  Your next appointment:   6 month(s)  Provider:   You may see Sherryl Manges, MD or one of the following Advanced Practice Providers on your designated Care Team:   Sherie Don, NP

## 2023-02-25 NOTE — Progress Notes (Signed)
See reply Patient Care Team: Reubin Milan, MD as PCP - General (Internal Medicine) Iran Ouch, MD as PCP - Cardiology (Cardiology) Duke Salvia, MD as PCP - Electrophysiology (Cardiology) Delma Freeze, FNP as Nurse Practitioner (Family Medicine) Erin Fulling, MD as Consulting Physician (Pulmonary Disease) Sherlon Handing, MD as Consulting Physician (Internal Medicine)   HPI  [name] is a 88 y.o. female Seen in follow-up for a pacemaker Medtronic implanted 8/16 [facility])  in the context of multi-valvular surgery bioprosthetic mitral valve replacement, tricuspid valve repair and MAZE operation w postoperative complete heart block; CRT upgrade attempted (SK)  failed  9/20--  Cath/echo>> progressive RV dysfunction in setting of Pulm HTN with oxygen dependent lung disease  She is deaf secondary to Mnire's disease and has a cochlear implant  Persistent atrial fibrillation;  anticoagulated with Coumadin and Rx with amio (started 11/19)  Reverted to AFl, assoc w palps weakness and breathlessness, seen by Dr CL with failed efforts to pace terminate and >> DCCV   Chronic stable shortness of breath some edema on the right leg associated with a slowly healing wound.  No chest pain.  No bleeding with Coumadin.  No cough or nausea with the amiodarone     DATE TEST EF   5/16 Echo   65 %   12/16 Echo   30-35 %   1/18 Echo  55%   11/19 Echo  20-25%   12/19 Echo  35%   6/20 RHC  RVsys 70 PCWP 23 RA  7/20 Echo  35% RV dysfunction mod  6/22 Echo   35-40% RV dysfunction severe PA press >58           Date Cr K Hgb TSH LFTs  8/17     9.2    5/18 0.8  11.5    5/19 0.8 3.9 13.5 (MCV 103<<89)    11/19 0.8 3.9 9.9 2.168 43-  6/20 0.84 4.9 8.0 2.860 (2/20) 51  11/20 0.74 4.2 11.8 2.119   6/21 0.8 3.8 13.1 1.039   11/21 0.82 4.1 12.2 (1/22) 0.824 14  12/22 1.01 3.2  1.39 15  6/23 0.96 3.7 11.7<<9.9 0.99 13  12/23 1.07 3.8 11.8 0.96 19     Hx of  recurrent anemia and has had Fe replacement, but not sure if there is a specific diagnosis    Past Medical History:  Diagnosis Date   Acute on chronic respiratory failure with hypoxia (HCC) 02/04/2019   Added automatically from request for surgery [id number]   Acute on chronic systolic heart failure (HCC)    Anxiety    Arthritis    "hands" (11/10/2015)   Breast cancer (HCC) 2002   right   CAD s/p CABG x 1    a. 04/2015 [place] to LAD   Chronic combined systolic (congestive) and diastolic (congestive) heart failure (HCC)    a. 01/2015 Echo: EF 50-55%, Gr2 DD (preop valve surgery); b. 08/2015 Echo: EF 30-35%, diff HK. Gr2 DD; c. 09/2016 Echo: EF 50-55%; d. 07/2018 Echo: EF 20-25%; e. 03/2019 Echo: EF 35-40%, diff HK. Mod red RV fxn, RVSP 34.5mmHg. Mildly dil LA. Nl MV prosthesis. Triv AI.   COPD (chronic obstructive pulmonary disease) (HCC)    Coronary artery disease    GERD (gastroesophageal reflux disease)    History of colon polyps    History of mitral valve replacement    a. 04/2015 s/p 27 mm [facility] Mitral bovine bioprosthetic tissue valve  Hyperlipidemia    Hypertension    Hypothyroidism    Maze operation for AF w/ LAA clipping    a. 04/2015 Complete bilateral atrial lesion set using cryothermy and bipolar radiofrequency ablation with clipping of LA appendage   Meniere disease    Meniere's disease    On home oxygen therapy    "2L at night" (11/10/2015)   PAF (paroxysmal atrial fibrillation) (HCC)    Personal history of radiation therapy    Pneumonia ~ 2010   PONV (postoperative nausea and vomiting)    Pt felt like eardrums were gonna pop   Post-surgical complete heart block, symptomatic    a. 04/2015 s/p MDT [id number] Advisa DR MRI DC PPM.   Presence of permanent cardiac pacemaker    Pulmonary hypertension (HCC)    Respiratory failure (HCC) 02/22/2019   Surgical wound, non healing - chest wall 11/10/2015   Tricuspid Regurgitation s/p Repair    a. 04/2015 s/p 26 mm Edwards mc3 ring  annuloplasty   Wound infection 10/13/2015   Superficial sternal wound infection    Past Surgical History:  Procedure Laterality Date   APPENDECTOMY     APPLICATION OF A-CELL OF CHEST/ABDOMEN N/A 11/21/2015   Procedure: APPLICATION OF A-CELL OF CHEST/ABDOMEN;  Surgeon: Alena Bills Dillingham, DO;  [facility]: [facility];  Service: Plastics;  Laterality: N/A;   APPLICATION OF A-CELL OF CHEST/ABDOMEN Right 12/21/2015   Procedure: APPLICATION OF A-CELL OF RIGHT CHEST;  Surgeon: Alena Bills Dillingham, DO;  [facility]: [facility];  Service: Plastics;  Laterality: Right;   APPLICATION OF A-CELL OF CHEST/ABDOMEN Right 01/11/2016   Procedure: APPLICATION OF A-CELL OF RIGHT CHEST;  Surgeon: Alena Bills Dillingham, DO;  [facility]: [facility];  Service: Plastics;  Laterality: Right;   APPLICATION OF A-CELL OF CHEST/ABDOMEN Right 02/12/2016   Procedure: APPLICATION OF A-CELL TO RIGHT CHEST WOUND;  Surgeon: Alena Bills Dillingham, DO;  [facility];  Service: Plastics;  Laterality: Right;   APPLICATION OF WOUND VAC N/A 05/09/2015   Procedure: APPLICATION OF WOUND VAC;  Surgeon: Purcell Nails, MD;  [facility];  Service: Thoracic;  Laterality: N/A;   APPLICATION OF WOUND VAC N/A 11/13/2015   Procedure: APPLICATION OF WOUND VAC;  Surgeon: Purcell Nails, MD;  [facility];  Service: Thoracic;  Laterality: N/A;   APPLICATION OF WOUND VAC N/A 11/21/2015   Procedure: APPLICATION OF WOUND VAC;  Surgeon: Alena Bills Dillingham, DO;  [facility];  Service: Plastics;  Laterality: N/A;   APPLICATION OF WOUND VAC Right 12/21/2015   Procedure: APPLICATION OF WOUND VAC to right chest wall ;  Surgeon: Alena Bills Dillingham, DO;  [facility];  Service: Plastics;  Laterality: Right;   APPLICATION OF WOUND VAC Right 01/11/2016   Procedure: APPLICATION OF WOUND VAC RIGHT CHEST ;  Surgeon: Alena Bills Dillingham, DO;  [facility];  Service: Plastics;  Laterality: Right;   APPLICATION OF WOUND VAC Right 01/24/2016   Procedure: APPLICATION OF WOUND VAC  RIGHT CHEST WALL;  Surgeon: Alena Bills Dillingham, DO;  [facility];  Service: Plastics;  Laterality: Right;   APPLICATION OF WOUND VAC Right 02/12/2016   Procedure: RE-APPLICATION OF WOUND VAC TO RIGHT CHEST WOUND;  Surgeon: Alena Bills Dillingham, DO;  [facility];  Service: Plastics;  Laterality: Right;   BIV UPGRADE N/A 06/07/2019   Procedure: BIV UPGRADE;  Surgeon: Duke Salvia, MD;  Location: [facility] INVASIVE CV LAB;  Service: Cardiovascular;  Laterality: N/A;   BREAST BIOPSY Left 11/25/06  neg   BREAST BIOPSY Left 01/20/12   /clip-neg   CARDIAC CATHETERIZATION  11/2013   [facility]   CARDIAC CATHETERIZATION  10/2014   [facility]   CARDIAC VALVE REPLACEMENT     CARDIOVERSION N/A 07/20/2018   Procedure: CARDIOVERSION;  Surgeon: Iran Ouch, MD;  Location: [facility] ORS;  Service: Cardiovascular;  Laterality: N/A;   CARDIOVERSION N/A 09/18/2020   Procedure: CARDIOVERSION;  Surgeon: Antonieta Iba, MD;  Location: [facility] ORS;  Service: Cardiovascular;  Laterality: N/A;   CATARACT EXTRACTION W/ INTRAOCULAR LENS  IMPLANT, BILATERAL Bilateral 2014   CLIPPING OF ATRIAL APPENDAGE N/A 04/25/2015   Procedure: CLIPPING OF ATRIAL APPENDAGE;  Surgeon: Purcell Nails, MD;  [facility];  Service: Open Heart Surgery;  Laterality: N/A;   COCHLEAR IMPLANT Left 2005?   COLONOSCOPY WITH PROPOFOL N/A 01/20/2018   Procedure: COLONOSCOPY WITH PROPOFOL;  Surgeon: Toledo, Boykin Nearing, MD;  Location: [facility] ENDOSCOPY;  Service: Gastroenterology;  Laterality: N/A;   CORONARY ANGIOPLASTY     CORONARY ARTERY BYPASS GRAFT N/A 04/25/2015   Procedure: CORONARY ARTERY BYPASS GRAFTING (CABG) x ONE, using left internal mammary artery;  Surgeon: Purcell Nails, MD;  [facility];  Service: Open Heart Surgery;  Laterality: N/A;   DILATION AND CURETTAGE OF UTERUS  "several before hysterectomy"   EP IMPLANTABLE DEVICE N/A 05/02/2015   Procedure: Pacemaker Implant;  Surgeon: Hillis Range, MD;  [facility];  Service:  Cardiovascular;  Laterality: N/A;   ESOPHAGOGASTRODUODENOSCOPY (EGD) WITH PROPOFOL N/A 01/20/2018   Procedure: ESOPHAGOGASTRODUODENOSCOPY (EGD) WITH PROPOFOL;  Surgeon: Toledo, Boykin Nearing, MD;  Location: [facility] ENDOSCOPY;  Service: Gastroenterology;  Laterality: N/A;   EYE SURGERY     I & D EXTREMITY Right 12/06/2015   Procedure: IRRIGATION AND DEBRIDEMENT RIGHT CHEST WALL WITH ACELL PLACEMENT AND VAC;  Surgeon: Alena Bills Dillingham, DO;  [facility];  Service: Plastics;  Laterality: Right;   INCISION AND DRAINAGE OF WOUND N/A 11/21/2015   Procedure: IRRIGATION AND DEBRIDEMENT WOUND;  Surgeon: Alena Bills Dillingham, DO;  [facility];  Service: Plastics;  Laterality: N/A;   INCISION AND DRAINAGE OF WOUND Right 12/21/2015   Procedure: IRRIGATION AND DEBRIDEMENT right chest wall WOUND;  Surgeon: Alena Bills Dillingham, DO;  [facility];  Service: Plastics;  Laterality: Right;  right chest wall    INCISION AND DRAINAGE OF WOUND Right 01/24/2016   Procedure: IRRIGATION AND DEBRIDEMENT RIGHT CHEST WALL WOUND;  Surgeon: Alena Bills Dillingham, DO;  [facility];  Service: Plastics;  Laterality: Right;   INCISION AND DRAINAGE OF WOUND Right 03/28/2016   Procedure: IRRIGATION AND DEBRIDEMENT RIGHT CHEST WALL WOUND WITH A Cell Placement;  Surgeon: Alena Bills Dillingham, DO;  [facility];  Service: Plastics;  Laterality: Right;   INCISION AND DRAINAGE OF WOUND Right 05/17/2016   Procedure: IRRIGATION AND DEBRIDEMENT OF RIGHT CHEST WOUND WITH A CELL PLACEMENT;  Surgeon: Alena Bills Dillingham, DO;  [facility];  Service: Plastics;  Laterality: Right;   INSERT / REPLACE / REMOVE PACEMAKER     IRRIGATION AND DEBRIDEMENT OF WOUND WITH SPLIT THICKNESS SKIN GRAFT Right 01/11/2016   Procedure: IRRIGATION AND DEBRIDEMENT OF RIGHT CHEST WOUND ;  Surgeon: Alena Bills Dillingham, DO;  [facility];  Service: Plastics;  Laterality: Right;   MASTECTOMY, PARTIAL Right 2002   positive, partial   MAZE N/A 04/25/2015    Procedure: MAZE;  Surgeon: Purcell Nails, MD;  Location: [facility] OR;  Service: Open Heart Surgery;  Laterality: N/A;   MITRAL VALVE REPAIR N/A 04/25/2015   Procedure: MITRAL VALVE  REPLACEMENT using a 27 mm Edwards Perimount Magna Mitral Ease Valve;  Surgeon: Purcell Nails, MD;  [facility];  Service: Open Heart Surgery;  Laterality: N/A;   RIGHT HEART CATH N/A 02/08/2019   Procedure: RIGHT HEART CATH;  Surgeon: Iran Ouch, MD;  Location: [facility] INVASIVE CV LAB;  Service: Cardiovascular;  Laterality: N/A;   SKIN SPLIT GRAFT Right 02/12/2016   Procedure: IRRIGATION AND DEBRIDEMENT RIGHT CHEST WOUND;  Surgeon: Alena Bills Dillingham, DO;  [facility];  Service: Plastics;  Laterality: Right;   STERNAL WIRES REMOVAL N/A 06/05/2015   Procedure: STERNAL WIRES REMOVAL;  Surgeon: Purcell Nails, MD;  [facility];  Service: Thoracic;  Laterality: N/A;   STERNAL WOUND DEBRIDEMENT N/A 05/09/2015   Procedure: STERNAL WOUND DEBRIDEMENT;  Surgeon: Purcell Nails, MD;  [facility];  Service: Thoracic;  Laterality: N/A;   STERNAL WOUND DEBRIDEMENT N/A 11/13/2015   Procedure: Excisional drainage of RIGHT Chest wall mass and breast mass ;  Surgeon: Purcell Nails, MD;  [facility];  Service: Thoracic;  Laterality: N/A;   TEE WITHOUT CARDIOVERSION N/A 04/25/2015   Procedure: TRANSESOPHAGEAL ECHOCARDIOGRAM (TEE);  Surgeon: Purcell Nails, MD;  Location[facility] OR;  Service: Open Heart Surgery;  Laterality: N/A;   TONSILLECTOMY     TRAM Right 11/18/2015   Procedure: VRAM Vertical Rectus Abdominus Muscle Flap;  Surgeon: Alena Bills Dillingham, DO;  [facility];  Service: Plastics;  Laterality: Right;  RIght Back   TRICUSPID VALVE REPLACEMENT N/A 04/25/2015   Procedure: TRICUSPID VALVE REPAIR;  Surgeon: Purcell Nails, MD;  [facility];  Service: Open Heart Surgery;  Laterality: N/A;   VAGINAL HYSTERECTOMY      Current Outpatient Medications  Medication Sig Dispense Refill   acetaminophen (TYLENOL)  500 MG tablet Take 1,000 mg by mouth every 6 (six) hours as needed (for pain.).     amiodarone (PACERONE) 200 MG tablet Take 1 tablet (200 mg) 5 days per week 90 tablet 3   bisoprolol (ZEBETA) 5 MG tablet Take 0.5 tablets (2.5 mg total) by mouth daily. 45 tablet 3   Cyanocobalamin (VITAMIN B-12 PO) Take 1,000 mcg by mouth daily.     diphenhydrAMINE (BENADRYL) 25 mg capsule Take 25 mg by mouth every 6 (six) hours as needed for allergies.     docusate sodium (COLACE) 100 MG capsule Take 100 mg by mouth 2 (two) times daily as needed (constipation.).     Ferrous Sulfate (IRON PO) Take 1 tablet by mouth daily.     furosemide (LASIX) 20 MG tablet TAKE 2 TABLETS (40 MG) IN THE MORNING AND 1 TABLET (20) MG IN THE EVENING. 270 tablet 0   gabapentin (NEURONTIN) 100 MG capsule Take 2 capsules (200 mg total) by mouth at bedtime. 180 capsule 1   ipratropium-albuterol (DUONEB) 0.5-2.5 (3) MG/3ML SOLN Inhale 3 mLs into the lungs every 6 (six) hours as needed. 120 mL 11   levothyroxine (SYNTHROID) 88 MCG tablet TAKE 1 TABLET BY MOUTH DAILY BEFORE BREAKFAST. 90 tablet 1   Multiple Vitamins-Minerals (PRESERVISION AREDS 2 PO) Take 1 tablet by mouth 2 (two) times daily.      mupirocin ointment (BACTROBAN) 2 % Apply 1 Application topically 2 (two) times daily. To skin wound 22 g 0   OXYGEN Inhale 2 L into the lungs continuous.     pantoprazole (PROTONIX) 40 MG tablet Take  1 tablet (40 mg total) by mouth 2 (two) times daily. 180 tablet 1   sacubitril-valsartan (ENTRESTO) 49-51 MG Take 1 tablet (49/51 mg) by mouth twice daily 180 tablet 3   simvastatin (ZOCOR) 10 MG tablet TAKE 1 TABLET BY MOUTH EVERYDAY AT BEDTIME 90 tablet 2   spironolactone (ALDACTONE) 25 MG tablet TAKE 1 TABLET BY MOUTH EVERY DAY 90 tablet 3   warfarin (COUMADIN) 2.5 MG tablet TAKE 1/2 A TABLET TO 1 TABLET BY MOUTH DAILY AS DIRECTED BY THE COUMADIN CLINIC. 75 tablet 0   No current facility-administered medications for this visit.    Allergies   Allergen Reactions   Ace Inhibitors Cough and Other (See Comments)   Amoxicillin-Pot Clavulanate Swelling and Other (See Comments)    SWOLLEN JOINTS  Has patient had a PCN reaction causing immediate rash, facial/tongue/throat swelling, SOB or lightheadedness with hypotension: Yes Has patient had a PCN reaction causing severe rash involving mucus membranes or skin necrosis: No Has patient had a PCN reaction that required hospitalization No Has patient had a PCN reaction occurring within the last 10 years: No If all of the above answers are "NO", then may proceed with Cephalosporin   Penicillins Other (See Comments) and Swelling    SWOLLEN JOINTS   Has patient had a PCN reaction causing immediate rash, facial/tongue/throat swelling, SOB or lightheadedness with hypotension: Yes  Has patient had a PCN reaction causing severe rash involving mucus membranes or skin necrosis: No  Has patient had a PCN reaction that required hospitalization No  Has patient had a PCN reaction occurring within the last 10 years: No  If all of the above answers are "NO", then may proceed with Cephalosporin use.  Swollen joints  Swollen joints, Has patient had a PCN reaction causing immediate rash, facial/tongue/throat swelling, SOB or lightheadedness with hypotension: Yes, Has patient had a PCN reaction causing severe rash involving mucus membranes or skin necrosis: No, Has patient had a PCN reaction that required hospitalization No, Has patient had a PCN reaction occurring within the last 10 years: No, If all of the above answers are "NO", then may proceed with Cephalosporin use.   Rosuvastatin Other (See Comments)   Nifedipine Other (See Comments)    UNSPECIFIED   Propranolol Other (See Comments)    UNSPECIFIED   Diazepam Other (See Comments)    Made her "hyper"    Meperidine Other (See Comments)    Made her "hyper"     Propoxyphene Other (See Comments)    Made her "hyper"        Review of  Systems negative except from HPI and PMH  Physical Exam BP 112/60 (BP Location: Left Arm, Patient Position: Sitting, Cuff Size: Normal)   Pulse 74   Ht 5' (1.524 m)   Wt 108 lb (49 kg)   SpO2 95% Comment: on 2L O2  BMI 21.09 kg/m   Well developed and well nourished in no acute distress using oxygen HENT normal Neck supple with JVP-flat Decreased breath sounds and occasional crackles Device pocket well healed; without hematoma or erythema.  There is no tethering  Regular rate and rhythm, no  gallop No  murmur Abd-soft with active BS No Clubbing cyanosis  edema Skin-warm and dry A & Oriented  Grossly normal sensory and motor function  ECG AV pacing  Device function is normal. Programming changes none See Paceart for details    Assessment and  Plan  Atrial fibrillation-persistent status post maze operation  Recurrent  High Risk  Medication Surveillance Amiodarone   Complete heart block -Intermittent  Sinus node dysfunction   First-degree AV block profound--- right bundle branch block  Status post mitral valve replacement-bioprosthetic with tricuspid valve repair  Pacemaker-Medtronic     Oxygen dependent lung disease    Cardiomyopathy   Tremor and gait instability  Anemia    No interval atrial arrhythmias.  Will decrease her amiodarone from 7--5 days a week i.e. 1400--1000 mg/week Tolerating the drug  Remain perplexed as to why she is not more polycythemic.  With her borderline elevated MCV, B12 and folate are normal.  Euvolemic.  Continue her bisoprolol or furosemide Entresto and spironolactone. Will have her surveillance laboratories checked by her PCP next month.

## 2023-03-04 ENCOUNTER — Encounter: Payer: Medicare Other | Admitting: Physician Assistant

## 2023-03-04 DIAGNOSIS — S81811A Laceration without foreign body, right lower leg, initial encounter: Secondary | ICD-10-CM | POA: Diagnosis not present

## 2023-03-04 NOTE — Progress Notes (Addendum)
[name] ([id number]) [id number] Page 1 of 9 Visit Report for 03/04/2023 Chief Complaint Document Details Patient Name: Date of Service: [name] 03/04/2023 3:00 PM Medical Record Number: [id number] Patient Account Number: [id number] Date of Birth/Sex: Treating RN: 06-26-1935 (88 y.o. [name] Primary Care Provider: Bari Edward Other Clinician: Referring Provider: Treating Provider/Extender: Francee Piccolo in Treatment: 4 Information Obtained from: Patient Chief Complaint Right LE Ulcer Electronic Signature(s) Signed: 03/04/2023 2:47:16 PM By: Allen Derry PA-C Entered By: Allen Derry on 03/04/2023 14:47:16 -------------------------------------------------------------------------------- Cellular or Tissue Based Product Details Patient Name: Date of Service: [name] [name] 03/04/2023 3:00 PM Medical Record Number: [id number] Patient Account Number: [id number] Date of Birth/Sex: Treating RN: 03-Apr-1935 (88 y.o. [name] Primary Care Provider: Bari Edward Other Clinician: Referring Provider: Treating Provider/Extender: Francee Piccolo in Treatment: 4 Cellular or Tissue Based Product Type Wound #1 Right,Lateral Lower Leg Applied to: Performed By: Physician Allen Derry, PA-C Cellular or Tissue Based Product Type: Apligraf Level of Consciousness (Pre-procedure): Awake and Alert Pre-procedure Verification/Time Out Yes - 15:26 Taken: Location: trunk / arms / legs Wound Size (sq cm): 4.76 Product Size (sq cm): 44 Waste Size (sq cm): 33 Waste Reason: size of wound Amount of Product Applied (sq cm): 11 Instrument Used: Blade, Forceps, Scissors Lot #: [id number] Expiration Date: 03/08/2023 Fenestrated: Yes InstrumentNINAH, [name] ([id number]) [id number] Page 2 of 9 Reconstituted: Yes Solution Type: saline Solution Amount: 10mL Lot #:  [id number] Solution Expiration Date: 12/09/2023 Secured: Yes Secured With: Steri-Strips, mepiltel Dressing Applied: Yes Primary Dressing: mepitel Procedural Pain: 0 Post Procedural Pain: 0 Response to Treatment: Procedure was tolerated well Level of Consciousness (Post- Awake and Alert procedure): Post Procedure Diagnosis Same as Pre-procedure Electronic Signature(s) Signed: 03/04/2023 4:12:56 PM By: Angelina Pih Entered By: Angelina Pih on 03/04/2023 15:35:19 -------------------------------------------------------------------------------- Debridement Details Patient Name: Date of Service: [name] [name] 03/04/2023 3:00 PM Medical Record Number: [id number] Patient Account Number: [id number] Date of Birth/Sex: Treating RN: 1934-09-11 (88 y.o. [name] Primary Care Provider: Bari Edward Other Clinician: Referring Provider: Treating Provider/Extender: Francee Piccolo in Treatment: 4 Debridement Performed for Assessment: Wound #1 Right,Lateral Lower Leg Performed By: Physician Allen Derry, PA-C Debridement Type: Debridement Level of Consciousness (Pre-procedure): Awake and Alert Pre-procedure Verification/Time Out Yes - 15:24 Taken: Pain Control: Lidocaine 4% T opical Solution Percent of Wound Bed Debrided: 100% T Area Debrided (cm): otal 3.74 Tissue and other material debrided: Viable, Non-Viable, Slough, Subcutaneous, Slough Level: Skin/Subcutaneous Tissue Debridement Description: Excisional Instrument: Curette Bleeding: Moderate Hemostasis Achieved: Pressure Response to Treatment: Procedure was tolerated well Level of Consciousness (Post- Awake and Alert procedure): Post Debridement Measurements of Total Wound Length: (cm) 3.4 Width: (cm) 1.4 Depth: (cm) 0.2 Volume: (cm) 0.748 Character of Wound/Ulcer Post Debridement: Stable Post Procedure Diagnosis Same as Pre-procedure Electronic Signature(s) [name], [name] ([id number])  [id number] Page 3 of 9 Signed: 03/04/2023 4:12:56 PM By: Angelina Pih Signed: 03/04/2023 5:11:00 PM By: Allen Derry PA-C Entered By: Angelina Pih on 03/04/2023 15:25:57 -------------------------------------------------------------------------------- HPI Details Patient Name: Date of Service: [name] [name] 03/04/2023 3:00 PM Medical Record Number: [id number] Patient Account Number: [id number] Date of Birth/Sex: Treating RN: July 16, 1935 (88 y.o. [name] Primary Care Provider: Bari Edward Other Clinician: Referring Provider: Treating Provider/Extender: Francee Piccolo in Treatment: 4 History of Present Illness HPI Description: 02-04-2023 patient presents today for initial evaluation here in our clinic concerning a wound which actually  began around October 24, 2022. With that being said she notes that this was due to an injury and then has continued to be a problem not wanting to close and heal as quickly as would be expected. She is on long-term anticoagulant therapy due to atrial fibrillation. She also has hypertension, COPD, coronary artery disease, congestive heart failure, and chronic venous insufficiency bilaterally. With that being said I do believe that this wound is currently showing some signs of improvement I do not see any obvious evidence of infection right now though that something we will keep a close eye on she has been on Ceftin this was December 18, 2022. She is not on that currently. Again she is on Coumadin. 02-11-2023 upon evaluation today patient appears to be doing decently well currently in regard to her leg wound. She is still is having some buildup of slough and necrotic debris at this time. With that being said I do think that overall for 1 week's time she has done decently well. 02-18-2023 upon evaluation today patient appears to be doing well currently in regard to her wound although there is slough and  biofilm buildup she has continued to have issues here with is slowly making progress towards healing. Fortunately I do not see any signs of active infection locally nor systemically at this time. No fevers, chills, nausea, vomiting, or diarrhea. 02-25-2023 upon evaluation patient appears to be doing pretty well currently in regard to her wound which is showing signs of improvement slowly but surely. Fortunately I do not see any evidence of active infection locally nor systemically which is great news. No fevers, chills, nausea, vomiting, or diarrhea. 03-04-2023 upon evaluation patient's wound is doing well. I do think that she is ready for application of the Apligraf today without improvement regarding on hand. I would hope that we can get this moving in the right direction hopefully quickly with the use of the Apligraf. Electronic Signature(s) Signed: 03/04/2023 3:41:19 PM By: Allen Derry PA-C Entered By: Allen Derry on 03/04/2023 15:41:19 -------------------------------------------------------------------------------- Physical Exam Details Patient Name: Date of Service: [name], Jamie [name] 03/04/2023 3:00 PM Medical Record Number: [id number] Patient Account Number: [id number] Date of Birth/Sex: Treating RN: [birth year] (88 y.o. [name] Primary Care Provider: Bari Edward Other Clinician: Referring Provider: Treating Provider/Extender: Francee Piccolo in Treatment: 4 Constitutional [name], [name] ([id number]) [id number] Page 4 of 9 Well-nourished and well-hydrated in no acute distress. Respiratory normal breathing without difficulty. Psychiatric this patient is able to make decisions and demonstrates good insight into disease process. Alert and Oriented x 3. pleasant and cooperative. Notes Upon inspection patient's wound bed actually showed signs of good granulation epithelization at this point. Fortunately I do not see any signs of  worsening overall and I believe that we are making good headway towards closure I am extremely pleased with where things stand currently. Electronic Signature(s) Signed: 03/04/2023 3:41:41 PM By: Allen Derry PA-C Entered By: Allen Derry on 03/04/2023 15:41:41 -------------------------------------------------------------------------------- Physician Orders Details Patient Name: Date of Service: [name] [name] 03/04/2023 3:00 PM Medical Record Number: [id number] Patient Account Number: [id number] Date of Birth/Sex: Treating RN: [birth year] (88 y.o. [name] Primary Care Provider: Bari Edward Other Clinician: Referring Provider: Treating Provider/Extender: Francee Piccolo in Treatment: 4 Verbal / Phone Orders: No Diagnosis Coding ICD-10 Coding Code Description [phone] Laceration without foreign body, right lower leg, initial encounter I87.331 Chronic venous hypertension (idiopathic) with ulcer and inflammation of right lower extremity L97.812  Non-pressure chronic ulcer of other part of right lower leg with fat layer exposed I48.0 Paroxysmal atrial fibrillation Z79.01 Long term (current) use of anticoagulants J44.9 Chronic obstructive pulmonary disease, unspecified I10 Essential (primary) hypertension 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 Follow-up Appointments Return Appointment in 1 week. Bathing/ Shower/ Hygiene Clean wound with Normal Saline or wound cleanser. May shower; gently cleanse wound with antibacterial soap, rinse and pat dry prior to dressing wounds No tub bath. Anesthetic (Use 'Patient Medications' Section for Anesthetic Order Entry) Lidocaine applied to wound bed Cellular or Tissue Based Products Cellular or Tissue Based Product Type: - apligraf applied #1 03/04/23 Cellular or Tissue Based Product  applied to wound bed; including contact layer, fixation with steri-strips, dry gauze and cover dressing. (DO NOT REMOVE). Edema Control - Lymphedema / Segmental Compressive Device / Other Elevate, Exercise Daily and A void Standing for Long Periods of Time. Elevate leg(s) parallel to the floor when sitting. DO YOUR BEST to sleep in the bed at night. DO NOT sleep in your recliner. Long hours of sitting in a recliner leads to swelling of the legs [name], [name] ([id number]) [id number] Page 5 of 9 and/or potential wounds on your backside. Wound Treatment Wound #1 - Lower Leg Wound Laterality: Right, Lateral Cleanser: Byram Ancillary Kit - 15 Day Supply (Generic) 1 x Per Week/30 Days Discharge Instructions: Use supplies as instructed; Kit contains: (15) Saline Bullets; (15) 3x3 Gauze; 15 pr Gloves Cleanser: Soap and Water 1 x Per Week/30 Days Discharge Instructions: Gently cleanse wound with antibacterial soap, rinse and pat dry prior to dressing wounds Cleanser: Wound Cleanser 1 x Per Week/30 Days Discharge Instructions: Wash your hands with soap and water. Remove old dressing, discard into plastic bag and place into trash. Cleanse the wound with Wound Cleanser prior to applying a clean dressing using gauze sponges, not tissues or cotton balls. Do not scrub or use excessive force. Pat dry using gauze sponges, not tissue or cotton balls. Prim Dressing: Mepitel One Silicone Wound Contact Layer, 2x3 (in/in) 1 x Per Week/30 Days ary Discharge Instructions: Add to wound bed to prevent sticking. Secondary Dressing: (BORDER) Zetuvit Plus SILICONE BORDER Dressing 5x5 (in/in) (Generic) 1 x Per Week/30 Days Discharge Instructions: Please do not put silicone bordered dressings under wraps. Use non-bordered dressing only. Secondary Dressing: steri strips 1 x Per Week/30 Days Electronic Signature(s) Signed: 03/04/2023 4:12:56 PM By: Angelina Pih Signed: 03/04/2023 5:11:00 PM  By: Allen Derry PA-C Entered By: Angelina Pih on 03/04/2023 15:34:55 -------------------------------------------------------------------------------- Problem List Details Patient Name: Date of Service: [name] [name] 03/04/2023 3:00 PM Medical Record Number: [id number] Patient Account Number: [id number] Date of Birth/Sex: Treating RN: 1935/07/21 (88 y.o. [name] Primary Care Provider: Bari Edward Other Clinician: Referring Provider: Treating Provider/Extender: Francee Piccolo in Treatment: 4 Active Problems ICD-10 Encounter Code Description Active Date MDM Diagnosis S81.811A Laceration without foreign body, right lower leg, initial encounter 02/04/2023 No Yes I87.331 Chronic venous hypertension (idiopathic) with ulcer and inflammation of right 02/04/2023 No Yes lower extremity L97.812 Non-pressure chronic ulcer of other part of right lower leg with fat layer 02/04/2023 No Yes exposed I48.0 Paroxysmal atrial fibrillation 02/04/2023 No Yes MAELI, SPACEK [name] ([id number]) [id number] Page 6 of 9 Z79.01 Long term (current) use of anticoagulants 02/04/2023 No Yes J44.9 Chronic obstructive pulmonary disease, unspecified 02/04/2023 No Yes I10 Essential (primary) hypertension 02/04/2023 No Yes I25.10 Atherosclerotic heart disease  of native coronary artery without angina pectoris 02/04/2023 No Yes I50.42 Chronic combined systolic (congestive) and diastolic (congestive) heart failure 02/04/2023 No Yes Inactive Problems Resolved Problems Electronic Signature(s) Signed: 03/04/2023 2:45:28 PM By: Allen Derry PA-C Entered By: Allen Derry on 03/04/2023 14:45:28 -------------------------------------------------------------------------------- Progress Note Details Patient Name: Date of Service: [name] [name] 03/04/2023 3:00 PM Medical Record Number: [id number] Patient Account Number: [id number] Date of Birth/Sex: Treating RN: 18-Dec-1934 (88 y.o.  [name] Primary Care Provider: Bari Edward Other Clinician: Referring Provider: Treating Provider/Extender: Francee Piccolo in Treatment: 4 Subjective Chief Complaint Information obtained from Patient Right LE Ulcer History of Present Illness (HPI) 02-04-2023 patient presents today for initial evaluation here in our clinic concerning a wound which actually began around October 24, 2022. With that being said she notes that this was due to an injury and then has continued to be a problem not wanting to close and heal as quickly as would be expected. She is on long-term anticoagulant therapy due to atrial fibrillation. She also has hypertension, COPD, coronary artery disease, congestive heart failure, and chronic venous insufficiency bilaterally. With that being said I do believe that this wound is currently showing some signs of improvement I do not see any obvious evidence of infection right now though that something we will keep a close eye on she has been on Ceftin this was December 18, 2022. She is not on that currently. Again she is on Coumadin. 02-11-2023 upon evaluation today patient appears to be doing decently well currently in regard to her leg wound. She is still is having some buildup of slough and necrotic debris at this time. With that being said I do think that overall for 1 week's time she has done decently well. 02-18-2023 upon evaluation today patient appears to be doing well currently in regard to her wound although there is slough and biofilm buildup she has continued to have issues here with is slowly making progress towards healing. Fortunately I do not see any signs of active infection locally nor systemically at this time. No fevers, chills, nausea, vomiting, or diarrhea. 02-25-2023 upon evaluation patient appears to be doing pretty well currently in regard to her wound which is showing signs of improvement slowly but surely. Fortunately I do not  see any evidence of active infection locally nor systemically which is great news. No fevers, chills, nausea, vomiting, or diarrhea. 03-04-2023 upon evaluation patient's wound is doing well. I do think that she is ready for application of the Apligraf today without improvement regarding on hand. I would hope that we can get this moving in the right direction hopefully quickly with the use of the Apligraf. [name], [name] ([id number]) [id number] Page 7 of 9 Objective Constitutional Well-nourished and well-hydrated in no acute distress. Vitals Time Taken: 3:05 PM, Height: 61 in, Weight: 108 lbs, BMI: 20.4, Temperature: 97.9 F, Pulse: 83 bpm, Respiratory Rate: 18 breaths/min, Blood Pressure: 105/65 mmHg. Respiratory normal breathing without difficulty. Psychiatric this patient is able to make decisions and demonstrates good insight into disease process. Alert and Oriented x 3. pleasant and cooperative. General Notes: Upon inspection patient's wound bed actually showed signs of good granulation epithelization at this point. Fortunately I do not see any signs of worsening overall and I believe that we are making good headway towards closure I am extremely pleased with where things stand currently. Integumentary (Hair, Skin) Wound #1 status is Open. Original cause of wound was Skin T ear/Laceration. The date acquired  was: 10/24/2022. The wound has been in treatment 4 weeks. The wound is located on the Right,Lateral Lower Leg. The wound measures 3.4cm length x 1.4cm width x 0.2cm depth; 3.738cm^2 area and 0.748cm^3 volume. There is Fat Layer (Subcutaneous Tissue) exposed. There is no tunneling or undermining noted. There is a medium amount of serosanguineous drainage noted. There is medium (34-66%) pink granulation within the wound bed. There is a medium (34-66%) amount of necrotic tissue within the wound bed including Adherent Slough. Assessment Active  Problems ICD-10 Laceration without foreign body, right lower leg, initial encounter Chronic venous hypertension (idiopathic) with ulcer and inflammation of right lower extremity Non-pressure chronic ulcer of other part of right lower leg with fat layer exposed Paroxysmal atrial fibrillation Long term (current) use of anticoagulants Chronic obstructive pulmonary disease, unspecified Essential (primary) hypertension Atherosclerotic heart disease of native coronary artery without angina pectoris Chronic combined systolic (congestive) and diastolic (congestive) heart failure Procedures Wound #1 Pre-procedure diagnosis of Wound #1 is a Skin T located on the Right,Lateral Lower Leg . There was a Excisional Skin/Subcutaneous Tissue Debridement ear with a total area of 3.74 sq cm performed by Allen Derry, PA-C. With the following instrument(s): Curette to remove Viable and Non-Viable tissue/material. Material removed includes Subcutaneous Tissue and Slough and after achieving pain control using Lidocaine 4% T opical Solution. No specimens were taken. A time out was conducted at 15:24, prior to the start of the procedure. A Moderate amount of bleeding was controlled with Pressure. The procedure was tolerated well. Post Debridement Measurements: 3.4cm length x 1.4cm width x 0.2cm depth; 0.748cm^3 volume. Character of Wound/Ulcer Post Debridement is stable. Post procedure Diagnosis Wound #1: Same as Pre-Procedure Pre-procedure diagnosis of Wound #1 is a Skin T located on the Right,Lateral Lower Leg. A skin graft procedure using a bioengineered skin substitute/cellular ear or tissue based product was performed by Allen Derry, PA-C with the following instrument(s): Blade, Forceps, and Scissors. Apligraf was applied and secured with Steri-Strips and mepiltel. 11 sq cm of product was utilized and 33 sq cm was wasted due to size of wound. Post Application, mepitel was applied. A Time Out was conducted at  15:26, prior to the start of the procedure. The procedure was tolerated well with a pain level of 0 throughout and a pain level of 0 following the procedure. Post procedure Diagnosis Wound #1: Same as Pre-Procedure . Plan Follow-up Appointments: Return Appointment in 1 week. Bathing/ Shower/ Hygiene: [name], [name] ([id number]) [id number] Page 8 of 9 Clean wound with Normal Saline or wound cleanser. May shower; gently cleanse wound with antibacterial soap, rinse and pat dry prior to dressing wounds No tub bath. Anesthetic (Use 'Patient Medications' Section for Anesthetic Order Entry): Lidocaine applied to wound bed Cellular or Tissue Based Products: Cellular or Tissue Based Product Type: - apligraf applied #1 03/04/23 Cellular or Tissue Based Product applied to wound bed; including contact layer, fixation with steri-strips, dry gauze and cover dressing. (DO NOT REMOVE). Edema Control - Lymphedema / Segmental Compressive Device / Other: Elevate, Exercise Daily and Avoid Standing for Long Periods of Time. Elevate leg(s) parallel to the floor when sitting. DO YOUR BEST to sleep in the bed at night. DO NOT sleep in your recliner. Long hours of sitting in a recliner leads to swelling of the legs and/or potential wounds on your backside. WOUND #1: - Lower Leg Wound Laterality: Right, Lateral Cleanser: Byram Ancillary Kit - 15 Day Supply (Generic) 1 x Per Week/30 Days Discharge Instructions: Use supplies  as instructed; Kit contains: (15) Saline Bullets; (15) 3x3 Gauze; 15 pr Gloves Cleanser: Soap and Water 1 x Per Week/30 Days Discharge Instructions: Gently cleanse wound with antibacterial soap, rinse and pat dry prior to dressing wounds Cleanser: Wound Cleanser 1 x Per Week/30 Days Discharge Instructions: Wash your hands with soap and water. Remove old dressing, discard into plastic bag and place into trash. Cleanse the wound with Wound Cleanser prior to applying a  clean dressing using gauze sponges, not tissues or cotton balls. Do not scrub or use excessive force. Pat dry using gauze sponges, not tissue or cotton balls. Prim Dressing: Mepitel One Silicone Wound Contact Layer, 2x3 (in/in) 1 x Per Week/30 Days ary Discharge Instructions: Add to wound bed to prevent sticking. Secondary Dressing: (BORDER) Zetuvit Plus SILICONE BORDER Dressing 5x5 (in/in) (Generic) 1 x Per Week/30 Days Discharge Instructions: Please do not put silicone bordered dressings under wraps. Use non-bordered dressing only. Secondary Dressing: steri strips 1 x Per Week/30 Days 1. I am going to suggest that we have the patient continue with close monitoring with the plan to see her next week at which point we will reapply the Apligraf after that we will do it every 2 weeks assuming she is doing well. 2. I am good recommend as well that she should keep the dressing in place if she ends up having to change it because of too much drainage then she should change just the outer dressing leaving everything that I have put on including the Mepitel and Steri-Strips in place that means the need to carefully remove there is if they have to do that. 3. Today patient had Apligraf #1 applied. We will see patient back for reevaluation in 1 week here in the clinic. If anything worsens or changes patient will contact our office for additional recommendations. Electronic Signature(s) Signed: 03/04/2023 3:42:16 PM By: Allen Derry PA-C Entered By: Allen Derry on 03/04/2023 15:42:16 -------------------------------------------------------------------------------- SuperBill Details Patient Name: Date of Service: [name] [name] 03/04/2023 Medical Record Number: [id number] Patient Account Number: [id number] Date of Birth/Sex: Treating RN: 02-21-1935 (88 y.o. [name] Primary Care Provider: Bari Edward Other Clinician: Referring Provider: Treating Provider/Extender: Francee Piccolo in Treatment: 4 Diagnosis Coding ICD-10 Codes Code Description [phone] Laceration without foreign body, right lower leg, initial encounter I87.331 Chronic venous hypertension (idiopathic) with ulcer and inflammation of right lower extremity L97.812 Non-pressure chronic ulcer of other part of right lower leg with fat layer exposed I48.0 Paroxysmal atrial fibrillation Z79.01 Long term (current) use of anticoagulants J44.9 Chronic obstructive pulmonary disease, unspecified I10 Essential (primary) hypertension Paradis, Alegandra [name] ([id number]) [id number] Page 9 of 9 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 Facility Procedures : CPT4 Code: [id number] Description: [phone] (Facility Use Only) Apligraf 44 SQ CM Modifier: Quantity: 44 : CPT4 Code: [id number] Description: 15271 - SKIN SUB GRAFT TRNK/ARM/LEG ICD-10 Diagnosis Description L97.812 Non-pressure chronic ulcer of other part of right lower leg with fat layer expo Modifier: sed Quantity: 1 Physician Procedures : CPT4 Code Description Modifier [id number] - WC PHYS SKIN SUB GRAFT TRNK/ARM/LEG ICD-10 Diagnosis Description L97.812 Non-pressure chronic ulcer of other part of right lower leg with fat layer exposed Quantity: 1 Electronic Signature(s) Signed: 03/04/2023 3:54:25 PM By: Angelina Pih Signed: 03/04/2023 5:11:00 PM By: Allen Derry PA-C Previous Signature: 03/04/2023 3:42:56 PM Version By: Allen Derry PA-C Entered By: Angelina Pih on 03/04/2023 15:54:25

## 2023-03-04 NOTE — Progress Notes (Addendum)
JESICA, GOHEEN ([id number]) [id number] Page 1 of 8 Visit Report for 03/04/2023 Arrival Information Details Patient Name: Date of Service: [name], [name] 03/04/2023 3:00 PM Medical Record Number: [id number] Patient Account Number: [id number] Date of Birth/Sex: Treating RN: [birth year] (88 y.o. [name] Primary Care Alec Mcphee: Bari Edward Other Clinician: Referring Pari Lombard: Treating Corrisa Gibby/Extender: Francee Piccolo in Treatment: 4 Visit Information History Since Last Visit Added or deleted any medications: No Patient Arrived: [name] Any new allergies or adverse reactions: No Arrival Time: 15:01 Had a fall or experienced change in No Accompanied By: spouse activities of daily living that may affect Transfer Assistance: EasyPivot Patient Lift risk of falls: Patient Identification Verified: Yes Hospitalized since last visit: No Secondary Verification Process Completed: Yes Has Dressing in Place as Prescribed: Yes Patient Has Alerts: Yes Pain Present Now: No Patient Alerts: Patient on Blood Thinner PACEMAKER 02/13/23 ABI [name] 02/13/23 ABI L 1.26 02/13/23 TBI [name] 0.91 02/13/23 TBI L 0.86 Electronic Signature(s) Signed: 03/04/2023 4:12:56 PM By: Angelina Pih Entered By: Angelina Pih on 03/04/2023 15:05:07 -------------------------------------------------------------------------------- Clinic Level of Care Assessment Details Patient Name: Date of Service: [name], CONERLY RA [name]. 03/04/2023 3:00 PM Medical Record Number: [id number] Patient Account Number: [id number] Date of Birth/Sex: Treating RN: 01-07-1935 (88 y.o. [name] Primary Care Paislyn Domenico: Bari Edward Other Clinician: Referring Donya Hitch: Treating Amory Zbikowski/Extender: Francee Piccolo in Treatment: 4 Clinic Level of Care Assessment Items TOOL 1 Quantity Score []  - 0 Use when EandM and Procedure is performed on INITIAL visit ASSESSMENTS - Nursing  Assessment / Reassessment []  - 0 General Physical Exam (combine w/ comprehensive assessment (listed just below) when performed on new pt. evals) []  - 0 Comprehensive Assessment (HX, ROS, Risk Assessments, Wounds Hx, etc.) Jamie, ESCALONA [name] ([id number]) [phone].pdf Page 2 of 8 ASSESSMENTS - Wound and Skin Assessment / Reassessment []  - 0 Dermatologic / Skin Assessment (not related to wound area) ASSESSMENTS - Ostomy and/or Continence Assessment and Care []  - 0 Incontinence Assessment and Management []  - 0 Ostomy Care Assessment and Management (repouching, etc.) PROCESS - Coordination of Care []  - 0 Simple Patient / Family Education for ongoing care []  - 0 Complex (extensive) Patient / Family Education for ongoing care []  - 0 Staff obtains Chiropractor, Records, T Results / Process Orders est []  - 0 Staff telephones HHA, Nursing Homes / Clarify orders / etc []  - 0 Routine Transfer to another Facility (non-emergent condition) []  - 0 Routine Hospital Admission (non-emergent condition) []  - 0 New Admissions / Manufacturing engineer / Ordering NPWT Apligraf, etc. , []  - 0 Emergency Hospital Admission (emergent condition) PROCESS - Special Needs []  - 0 Pediatric / Minor Patient Management []  - 0 Isolation Patient Management []  - 0 Hearing / Language / Visual special needs []  - 0 Assessment of Community assistance (transportation, D/C planning, etc.) []  - 0 Additional assistance / Altered mentation []  - 0 Support Surface(s) Assessment (bed, cushion, seat, etc.) INTERVENTIONS - Miscellaneous []  - 0 External ear exam []  - 0 Patient Transfer (multiple staff / Nurse, adult / Similar devices) []  - 0 Simple Staple / Suture removal (25 or less) []  - 0 Complex Staple / Suture removal (26 or more) []  - 0 Hypo/Hyperglycemic Management (do not check if billed separately) []  - 0 Ankle / Brachial Index (ABI) - do not check if billed separately Has the patient  been seen at the hospital within the last three years: Yes Total Score: 0 Level Of Care: ____  Electronic Signature(s) Signed: 03/04/2023 4:12:56 PM By: Angelina Pih Entered By: Angelina Pih on 03/04/2023 15:54:17 -------------------------------------------------------------------------------- Encounter Discharge Information Details Patient Name: Date of Service: Jamie Davenport RA [name]. 03/04/2023 3:00 PM Medical Record Number: [id number] Patient Account Number: [id number] Date of Birth/Sex: Treating RN: 1934/12/06 (88 y.o. [name] Primary Care Silus Lanzo: Bari Edward Other Clinician: Referring Zykira Matlack: Treating Shantel Helwig/Extender: Francee Piccolo in Treatment: 4 [place], [name] ([id number]) [id number] Page 3 of 8 Encounter Discharge Information Items Post Procedure Vitals Discharge Condition: Stable Temperature (F): 97.9 Ambulatory Status: Cane Pulse (bpm): 83 Discharge Destination: Home Respiratory Rate (breaths/min): 18 Transportation: Private Auto Blood Pressure (mmHg): 105/65 Accompanied By: spouse Schedule Follow-up Appointment: Yes Clinical Summary of Care: Electronic Signature(s) Signed: 03/04/2023 3:56:42 PM By: Angelina Pih Entered By: Angelina Pih on 03/04/2023 15:56:42 -------------------------------------------------------------------------------- Lower Extremity Assessment Details Patient Name: Date of Service: Jamie, AUKER RA [name]. 03/04/2023 3:00 PM Medical Record Number: [id number] Patient Account Number: [id number] Date of Birth/Sex: Treating RN: 09-21-1934 (88 y.o. [name] Primary Care Sundae Maners: Bari Edward Other Clinician: Referring Taylan Marez: Treating Ian Cavey/Extender: Tonye Pearson Weeks in Treatment: 4 Edema Assessment Assessed: [Left: No] [Right: No] Edema: [Left: N] [Right: o] Calf Left: Right: Point of Measurement: 32 cm From Medial Instep 28.5 cm Ankle Left:  Right: Point of Measurement: 11 cm From Medial Instep 18.5 cm Vascular Assessment Pulses: Dorsalis Pedis Palpable: [Right:Yes] Electronic Signature(s) Signed: 03/04/2023 4:12:56 PM By: Angelina Pih Entered By: Angelina Pih on 03/04/2023 15:10:16 Carma Lair [name] ([id number]) [id number] Page 4 of 8 -------------------------------------------------------------------------------- Multi Wound Chart Details Patient Name: Date of Service: CHAMPAGNE, PALETTA RA [name]. 03/04/2023 3:00 PM Medical Record Number: [id number] Patient Account Number: [id number] Date of Birth/Sex: Treating RN: 1935-01-29 (88 y.o. [name] Primary Care Palak Tercero: Bari Edward Other Clinician: Referring Aadhav Uhlig: Treating Matilyn Fehrman/Extender: Francee Piccolo in Treatment: 4 Vital Signs Height(in): 61 Pulse(bpm): 83 Weight(lbs): 108 Blood Pressure(mmHg): 105/65 Body Mass Index(BMI): 20.4 Temperature(F): 97.9 Respiratory Rate(breaths/min): 18 [1:Photos:] [N/A:N/A] Right, Lateral Lower Leg N/A N/A Wound Location: Skin Tear/Laceration N/A N/A Wounding Event: Skin Tear N/A N/A Primary Etiology: Chronic Obstructive Pulmonary N/A N/A Comorbid History: Disease (COPD), Arrhythmia, Congestive Heart Failure, Coronary Artery Disease, Hypertension, Received Radiation 10/24/2022 N/A N/A Date Acquired: 4 N/A N/A Weeks of Treatment: Open N/A N/A Wound Status: No N/A N/A Wound Recurrence: 3.4x1.4x0.2 N/A N/A Measurements L x W x D (cm) 3.738 N/A N/A A (cm) : rea 0.748 N/A N/A Volume (cm) : 19.90% N/A N/A % Reduction in Area: -60.20% N/A N/A % Reduction in Volume: Full Thickness Without Exposed N/A N/A Classification: Support Structures Medium N/A N/A Exudate A mount: Serosanguineous N/A N/A Exudate Type: red, brown N/A N/A Exudate Color: Medium (34-66%) N/A N/A Granulation A mount: Pink N/A N/A Granulation Quality: Medium (34-66%) N/A N/A Necrotic A  mount: Fat Layer (Subcutaneous Tissue): Yes N/A N/A Exposed Structures: None N/A N/A Epithelialization: Treatment Notes Electronic Signature(s) Signed: 03/04/2023 4:12:56 PM By: Angelina Pih Entered By: Angelina Pih on 03/04/2023 15:23:32 Carma Lair [name] ([id number]) [id number] Page 5 of 8 -------------------------------------------------------------------------------- Multi-Disciplinary Care Plan Details Patient Name: Date of Service: BELLADONNA, LUBINSKI RA [name]. 03/04/2023 3:00 PM Medical Record Number: [id number] Patient Account Number: [id number] Date of Birth/Sex: Treating RN: 1935-09-09 (88 y.o. [name] Primary Care Chaniyah Jahr: Bari Edward Other Clinician: Referring Adolph Clutter: Treating Jamariyah Johannsen/Extender: Francee Piccolo in Treatment: 4 Active Inactive Wound/Skin Impairment Nursing Diagnoses: Impaired tissue integrity Knowledge deficit related to ulceration/compromised skin integrity Goals: Ulcer/skin breakdown  will have a volume reduction of 30% by week 4 Date Initiated: 02/04/2023 Date Inactivated: 03/04/2023 Target Resolution Date: 03/04/2023 Goal Status: Unmet Unmet Reason: comorbidities Ulcer/skin breakdown will have a volume reduction of 50% by week 8 Date Initiated: 02/04/2023 Target Resolution Date: 04/01/2023 Goal Status: Active Ulcer/skin breakdown will have a volume reduction of 80% by week 12 Date Initiated: 02/04/2023 Target Resolution Date: 04/29/2023 Goal Status: Active Ulcer/skin breakdown will heal within 14 weeks Date Initiated: 02/04/2023 Target Resolution Date: 05/13/2023 Goal Status: Active Interventions: Assess patient/caregiver ability to obtain necessary supplies Assess patient/caregiver ability to perform ulcer/skin care regimen upon admission and as needed Assess ulceration(s) every visit Provide education on ulcer and skin care Treatment Activities: Skin care regimen initiated :  02/04/2023 Notes: Electronic Signature(s) Signed: 03/04/2023 3:55:18 PM By: Angelina Pih Entered By: Angelina Pih on 03/04/2023 15:55:18 Pain Assessment Details -------------------------------------------------------------------------------- Merri Ray ([id number]) [id number] Page 6 of 8 Patient Name: Date of Service: BELLA, BRUMMET [name]. 03/04/2023 3:00 PM Medical Record Number: [id number] Patient Account Number: [id number] Date of Birth/Sex: Treating RN: December 01, 1934 (88 y.o. [name] Primary Care Shikara Mcauliffe: Bari Edward Other Clinician: Referring Ericia Moxley: Treating Catelin Manthe/Extender: Francee Piccolo in Treatment: 4 Active Problems Location of Pain Severity and Description of Pain Patient Has Paino No Site Locations Rate the pain. Current Pain Level: 0 Pain Management and Medication Current Pain Management: Electronic Signature(s) Signed: 03/04/2023 4:12:56 PM By: Angelina Pih Entered By: Angelina Pih on 03/04/2023 15:05:46 -------------------------------------------------------------------------------- Patient/Caregiver Education Details Patient Name: Date of Service: Westley Hummer 6/25/2024andnbsp3:00 PM Medical Record Number: [id number] Patient Account Number: [id number] Date of Birth/Gender: Treating RN: [birth year] (88 y.o. [name] Primary Care Physician: Bari Edward Other Clinician: Referring Physician: Treating Physician/Extender: Francee Piccolo in Treatment: 4 Education Assessment Education Provided To: Patient Education Topics Provided Wound Debridement: Handouts: Wound Debridement Methods: Explain/Verbal Responses: State content correctly Wound/Skin ImpairmentBLANKA, ROCKHOLT ([id number]) [id number] Page 7 of 8 Handouts: Caring for Your Ulcer Methods: Explain/Verbal Responses: State content correctly Electronic Signature(s) Signed:  03/04/2023 4:12:56 PM By: Angelina Pih Entered By: Angelina Pih on 03/04/2023 15:55:46 -------------------------------------------------------------------------------- Wound Assessment Details Patient Name: Date of Service: BRYNJA, MARKER RA [name]. 03/04/2023 3:00 PM Medical Record Number: [id number] Patient Account Number: [id number] Date of Birth/Sex: Treating RN: 11/03/1934 (88 y.o. [name] Primary Care Emmalie Haigh: Bari Edward Other Clinician: Referring Lalani Winkles: Treating Lekha Dancer/Extender: Francee Piccolo in Treatment: 4 Wound Status Wound Number: 1 Primary Skin T ear Etiology: Wound Location: Right, Lateral Lower Leg Wound Open Wounding Event: Skin Tear/Laceration Status: Date Acquired: 10/24/2022 Comorbid Chronic Obstructive Pulmonary Disease (COPD), Arrhythmia, Weeks Of Treatment: 4 History: Congestive Heart Failure, Coronary Artery Disease, Hypertension, Clustered Wound: No Received Radiation Photos Wound Measurements Length: (cm) 3.4 Width: (cm) 1.4 Depth: (cm) 0.2 Area: (cm) 3.738 Volume: (cm) 0.748 % Reduction in Area: 19.9% % Reduction in Volume: -60.2% Epithelialization: None Tunneling: No Undermining: No Wound Description Classification: Full Thickness Without Exposed Suppor Exudate Amount: Medium Exudate Type: Serosanguineous Exudate Color: red, brown t Structures Foul Odor After Cleansing: No Slough/Fibrino Yes Wound Bed Granulation Amount: Medium (34-66%) Exposed Structure Granulation Quality: Pink Fat Layer (Subcutaneous Tissue) Exposed: Yes Necrotic Amount: Medium (34-66%) Necrotic Quality: [address], [place] [name] ([id number]) [id number] Page 8 of 8 Treatment Notes Wound #1 (Lower Leg) Wound Laterality: Right, Lateral Cleanser [place] Ancillary Kit - 15 Day Supply Discharge Instruction: Use supplies as instructed; Kit contains: (15) Saline Bullets; (15) 3x3 Gauze; 15 pr Gloves  Soap and  Water Discharge Instruction: Gently cleanse wound with antibacterial soap, rinse and pat dry prior to dressing wounds Wound Cleanser Discharge Instruction: Wash your hands with soap and water. Remove old dressing, discard into plastic bag and place into trash. Cleanse the wound with Wound Cleanser prior to applying a clean dressing using gauze sponges, not tissues or cotton balls. Do not scrub or use excessive force. Pat dry using gauze sponges, not tissue or cotton balls. Peri-Wound Care Topical Primary Dressing Mepitel One Silicone Wound Contact Layer, 2x3 (in/in) Discharge Instruction: Add to wound bed to prevent sticking. Secondary Dressing (BORDER) Zetuvit Plus SILICONE BORDER Dressing 5x5 (in/in) Discharge Instruction: Please do not put silicone bordered dressings under wraps. Use non-bordered dressing only. steri strips Secured With Compression Wrap Compression Stockings Facilities manager) Signed: 03/04/2023 4:12:56 PM By: Angelina Pih Entered By: Angelina Pih on 03/04/2023 15:12:37 -------------------------------------------------------------------------------- Vitals Details Patient Name: Date of Service: Jamie Davenport RA [name]. 03/04/2023 3:00 PM Medical Record Number: [id number] Patient Account Number: [id number] Date of Birth/Sex: Treating RN: 24-Jul-1935 (88 y.o. [name] Primary Care Bryonna Sundby: Bari Edward Other Clinician: Referring Tahtiana Rozier: Treating Jemima Petko/Extender: Francee Piccolo in Treatment: 4 Vital Signs Time Taken: 15:05 Temperature (F): 97.9 Height (in): 61 Pulse (bpm): 83 Weight (lbs): 108 Respiratory Rate (breaths/min): 18 Body Mass Index (BMI): 20.4 Blood Pressure (mmHg): 105/65 Reference Range: 80 - 120 mg / dl Electronic Signature(s) Signed: 03/04/2023 4:12:56 PM By: Angelina Pih Entered By: Angelina Pih on 03/04/2023 15:05:30

## 2023-03-05 ENCOUNTER — Ambulatory Visit: Payer: Medicare Other | Attending: Cardiovascular Disease

## 2023-03-05 DIAGNOSIS — I340 Nonrheumatic mitral (valve) insufficiency: Secondary | ICD-10-CM | POA: Diagnosis present

## 2023-03-05 DIAGNOSIS — Z5181 Encounter for therapeutic drug level monitoring: Secondary | ICD-10-CM

## 2023-03-05 DIAGNOSIS — Z953 Presence of xenogenic heart valve: Secondary | ICD-10-CM | POA: Diagnosis not present

## 2023-03-05 LAB — POCT INR: INR: 2.3 (ref 2.0–3.0)

## 2023-03-05 NOTE — Patient Instructions (Signed)
Continue 1 tablet daily EXCEPT 0.5 TABLET ON MONDAYS, WEDNESDAYS, and FRIDAYS - Recheck INR in 6 weeks. 

## 2023-03-06 ENCOUNTER — Ambulatory Visit (INDEPENDENT_AMBULATORY_CARE_PROVIDER_SITE_OTHER): Payer: Medicare Other

## 2023-03-06 DIAGNOSIS — I442 Atrioventricular block, complete: Secondary | ICD-10-CM

## 2023-03-06 LAB — CUP PACEART REMOTE DEVICE CHECK
Battery Remaining Longevity: 20 mo
Battery Voltage: 2.92 V
Brady Statistic AP VP Percent: 99.47 %
Brady Statistic AP VS Percent: 0.38 %
Brady Statistic AS VP Percent: 0.16 %
Brady Statistic AS VS Percent: 0 %
Brady Statistic RA Percent Paced: 99.59 %
Brady Statistic RV Percent Paced: 99.47 %
Date Time Interrogation Session: 20240627133038
Implantable Lead Connection Status: 753985
Implantable Lead Connection Status: 753985
Implantable Lead Implant Date: 20160823
Implantable Lead Implant Date: 20160823
Implantable Lead Location: 753859
Implantable Lead Location: 753860
Implantable Lead Model: 5076
Implantable Lead Model: 5076
Implantable Pulse Generator Implant Date: 20160823
Lead Channel Impedance Value: 323 Ohm
Lead Channel Impedance Value: 361 Ohm
Lead Channel Impedance Value: 361 Ohm
Lead Channel Impedance Value: 380 Ohm
Lead Channel Pacing Threshold Amplitude: 0.5 V
Lead Channel Pacing Threshold Amplitude: 0.625 V
Lead Channel Pacing Threshold Pulse Width: 0.4 ms
Lead Channel Pacing Threshold Pulse Width: 0.4 ms
Lead Channel Sensing Intrinsic Amplitude: 1.125 mV
Lead Channel Sensing Intrinsic Amplitude: 1.125 mV
Lead Channel Sensing Intrinsic Amplitude: 15.75 mV
Lead Channel Sensing Intrinsic Amplitude: 15.75 mV
Lead Channel Setting Pacing Amplitude: 1.5 V
Lead Channel Setting Pacing Amplitude: 2 V
Lead Channel Setting Pacing Pulse Width: 0.4 ms
Lead Channel Setting Sensing Sensitivity: 2.8 mV
Zone Setting Status: 755011

## 2023-03-11 ENCOUNTER — Encounter: Payer: Medicare Other | Attending: Physician Assistant | Admitting: Physician Assistant

## 2023-03-11 DIAGNOSIS — S81811A Laceration without foreign body, right lower leg, initial encounter: Secondary | ICD-10-CM | POA: Diagnosis present

## 2023-03-11 DIAGNOSIS — I480 Paroxysmal atrial fibrillation: Secondary | ICD-10-CM | POA: Insufficient documentation

## 2023-03-11 DIAGNOSIS — J449 Chronic obstructive pulmonary disease, unspecified: Secondary | ICD-10-CM | POA: Diagnosis not present

## 2023-03-11 DIAGNOSIS — I2510 Atherosclerotic heart disease of native coronary artery without angina pectoris: Secondary | ICD-10-CM | POA: Insufficient documentation

## 2023-03-11 DIAGNOSIS — I5042 Chronic combined systolic (congestive) and diastolic (congestive) heart failure: Secondary | ICD-10-CM | POA: Insufficient documentation

## 2023-03-11 DIAGNOSIS — Z7901 Long term (current) use of anticoagulants: Secondary | ICD-10-CM | POA: Insufficient documentation

## 2023-03-11 DIAGNOSIS — I87331 Chronic venous hypertension (idiopathic) with ulcer and inflammation of right lower extremity: Secondary | ICD-10-CM | POA: Insufficient documentation

## 2023-03-11 DIAGNOSIS — L97812 Non-pressure chronic ulcer of other part of right lower leg with fat layer exposed: Secondary | ICD-10-CM | POA: Diagnosis not present

## 2023-03-11 DIAGNOSIS — I110 Hypertensive heart disease with heart failure: Secondary | ICD-10-CM | POA: Insufficient documentation

## 2023-03-11 NOTE — Progress Notes (Addendum)
[name] ([id number]) [id number] Page 1 of 8 Visit Report for 03/11/2023 Chief Complaint Document Details Patient Name: Date of Service: [name], [name] 03/11/2023 2:30 PM Medical Record Number: [id number] Patient Account Number: [id number] Date of Birth/Sex: Treating RN: 29-Jul-1935 (88 y.o. [name] Primary Care Provider: Bari Edward Other Clinician: Referring Provider: Treating Provider/Extender: Francee Piccolo in Treatment: 5 Information Obtained from: Patient Chief Complaint Right LE Ulcer Electronic Signature(s) Signed: 03/11/2023 2:44:13 PM By: Allen Derry PA-C Entered By: Allen Derry on 03/11/2023 14:44:12 -------------------------------------------------------------------------------- Cellular or Tissue Based Product Details Patient Name: Date of Service: Jamie Davenport RA R. 03/11/2023 2:30 PM Medical Record Number: [id number] Patient Account Number: [id number] Date of Birth/Sex: Treating RN: Jul 26, 1935 (88 y.o. [name] Primary Care Provider: Bari Edward Other Clinician: Referring Provider: Treating Provider/Extender: Francee Piccolo in Treatment: 5 Cellular or Tissue Based Product Type Wound #1 Right,Lateral Lower Leg Applied to: Performed By: Physician Allen Derry, PA-C Cellular or Tissue Based Product Type: Apligraf Level of Consciousness (Pre-procedure): Awake and Alert Pre-procedure Verification/Time Out No Taken: Location: trunk / arms / legs Wound Size (sq cm): 0.6 Product Size (sq cm): 44 Waste Size (sq cm): 22 Waste Reason: size of wound Amount of Product Applied (sq cm): 22 Instrument Used: Blade, Forceps, Scissors Lot #: [id number] Order #: 2 Expiration Date: 03/15/2023 FenestratedSUMITRA, DECKERT ([id number]) [id number] Page 2 of 8 Instrument: Blade Reconstituted: Yes Solution Type: normal saline Solution Amount: 10 ml Lot #:  [id number] Solution Expiration Date: 01/07/2024 Secured: Yes Secured With: Steri-Strips Dressing Applied: Yes Primary Dressing: sorbact Procedural Pain: 0 Post Procedural Pain: 0 Response to Treatment: Procedure was tolerated well Level of Consciousness (Post- Awake and Alert procedure): Post Procedure Diagnosis Same as Pre-procedure Electronic Signature(s) Signed: 04/15/2023 3:49:02 PM By: Yevonne Pax RN Entered By: Yevonne Pax on 03/11/2023 15:33:34 -------------------------------------------------------------------------------- HPI Details Patient Name: Date of Service: Jamie Davenport RA R. 03/11/2023 2:30 PM Medical Record Number: [id number] Patient Account Number: [id number] Date of Birth/Sex: Treating RN: September 16, 1934 (88 y.o. [name] Primary Care Provider: Bari Edward Other Clinician: Referring Provider: Treating Provider/Extender: Francee Piccolo in Treatment: 5 History of Present Illness HPI Description: 02-04-2023 patient presents today for initial evaluation here in our clinic concerning a wound which actually began around October 24, 2022. With that being said she notes that this was due to an injury and then has continued to be a problem not wanting to close and heal as quickly as would be expected. She is on long-term anticoagulant therapy due to atrial fibrillation. She also has hypertension, COPD, coronary artery disease, congestive heart failure, and chronic venous insufficiency bilaterally. With that being said I do believe that this wound is currently showing some signs of improvement I do not see any obvious evidence of infection right now though that something we will keep a close eye on she has been on Ceftin this was December 18, 2022. She is not on that currently. Again she is on Coumadin. 02-11-2023 upon evaluation today patient appears to be doing decently well currently in regard to her leg wound. She is still is having some buildup of slough  and necrotic debris at this time. With that being said I do think that overall for 1 week's time she has done decently well. 02-18-2023 upon evaluation today patient appears to be doing well currently in regard to her wound although there is slough and biofilm buildup she has continued to have  issues here with is slowly making progress towards healing. Fortunately I do not see any signs of active infection locally nor systemically at this time. No fevers, chills, nausea, vomiting, or diarrhea. 02-25-2023 upon evaluation patient appears to be doing pretty well currently in regard to her wound which is showing signs of improvement slowly but surely. Fortunately I do not see any evidence of active infection locally nor systemically which is great news. No fevers, chills, nausea, vomiting, or diarrhea. 03-04-2023 upon evaluation patient's wound is doing well. I do think that she is ready for application of the Apligraf today without improvement regarding on hand. I would hope that we can get this moving in the right direction hopefully quickly with the use of the Apligraf. 03-11-2023 upon evaluation today patient appears to be doing well currently in regard to her wound. This is actually measuring smaller and looking much better. Fortunately I do not see any signs of active infection locally or systemically which is great news and in general I do think that we are making good headway towards complete closure. Electronic Signature(s) Signed: 03/11/2023 4:03:14 PM By: Allen Derry PA-C Entered By: Allen Derry on 03/11/2023 16:03:14 [name] ([id number]) [id number] Page 3 of 8 -------------------------------------------------------------------------------- Physical Exam Details Patient Name: Date of Service: [name], [name] 03/11/2023 2:30 PM Medical Record Number: [id number] Patient Account Number: [id number] Date of Birth/Sex: Treating RN: 1934-11-03 (88 y.o. [name] Primary Care Provider: Bari Edward Other Clinician: Referring Provider: Treating Provider/Extender: Francee Piccolo in Treatment: 5 Constitutional Well-nourished and well-hydrated in no acute distress. Respiratory normal breathing without difficulty. Psychiatric this patient is able to make decisions and demonstrates good insight into disease process. Alert and Oriented x 3. pleasant and cooperative. Notes Based on what I am seeing I do feel like the patient has made good progress here. The wound is much cleaner compared to where it was previous. Fortunately I do not see any signs of active infection locally or systemically which is great news. No fevers, chills, nausea, vomiting, or diarrhea. Electronic Signature(s) Signed: 03/11/2023 4:03:57 PM By: Allen Derry PA-C Entered By: Allen Derry on 03/11/2023 16:03:57 -------------------------------------------------------------------------------- Physician Orders Details Patient Name: Date of Service: Jamie Davenport RA R. 03/11/2023 2:30 PM Medical Record Number: [id number] Patient Account Number: [id number] Date of Birth/Sex: Treating RN: Mar 16, 1935 (88 y.o. [name] Primary Care Provider: Bari Edward Other Clinician: Referring Provider: Treating Provider/Extender: Francee Piccolo in Treatment: 5 Verbal / Phone Orders: No Diagnosis Coding ICD-10 Coding Code Description [phone] Laceration without foreign body, right lower leg, initial encounter I87.331 Chronic venous hypertension (idiopathic) with ulcer and inflammation of right lower extremity L97.812 Non-pressure chronic ulcer of other part of right lower leg with fat layer exposed I48.0 Paroxysmal atrial fibrillation Z79.01 Long term (current) use of anticoagulants J44.9 Chronic obstructive pulmonary disease, unspecified Lafalce, Ambrielle R ([id number]) [id number] Page 4 of 8 I10 Essential (primary)  hypertension 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 Follow-up Appointments Return Appointment in 1 week. Bathing/ Shower/ Hygiene Clean wound with Normal Saline or wound cleanser. May shower; gently cleanse wound with antibacterial soap, rinse and pat dry prior to dressing wounds No tub bath. Anesthetic (Use 'Patient Medications' Section for Anesthetic Order Entry) Lidocaine applied to wound bed Cellular or Tissue Based Products Cellular or Tissue Based Product Type: - apligraf applied #2 Cellular or Tissue Based Product applied to  wound bed; including contact layer, fixation with steri-strips, dry gauze and cover dressing. (DO NOT REMOVE). - change to sorbact steri strips abd pad roll guaze and tubi grip size c Edema Control - Lymphedema / Segmental Compressive Device / Other Elevate, Exercise Daily and A void Standing for Long Periods of Time. Elevate leg(s) parallel to the floor when sitting. DO YOUR BEST to sleep in the bed at night. DO NOT sleep in your recliner. Long hours of sitting in a recliner leads to swelling of the legs and/or potential wounds on your backside. Wound Treatment Electronic Signature(s) Signed: 03/11/2023 5:13:07 PM By: Allen Derry PA-C Signed: 04/15/2023 3:49:02 PM By: Yevonne Pax RN Entered By: Yevonne Pax on 03/11/2023 15:30:17 -------------------------------------------------------------------------------- Problem List Details Patient Name: Date of Service: Jamie Davenport RA R. 03/11/2023 2:30 PM Medical Record Number: [id number] Patient Account Number: [id number] Date of Birth/Sex: Treating RN: 10/06/1934 (88 y.o. [name] Primary Care Provider: Bari Edward Other Clinician: Referring Provider: Treating Provider/Extender: Francee Piccolo in Treatment: 5 Active Problems ICD-10 Encounter Code  Description Active Date MDM Diagnosis S81.811A Laceration without foreign body, right lower leg, initial encounter 02/04/2023 No Yes I87.331 Chronic venous hypertension (idiopathic) with ulcer and inflammation of right 02/04/2023 No Yes lower extremity L97.812 Non-pressure chronic ulcer of other part of right lower leg with fat layer 02/04/2023 No Yes exposed I48.0 Paroxysmal atrial fibrillation 02/04/2023 No Yes KRISSIA, RALSTON R ([id number]) [id number] Page 5 of 8 Z79.01 Long term (current) use of anticoagulants 02/04/2023 No Yes J44.9 Chronic obstructive pulmonary disease, unspecified 02/04/2023 No Yes I10 Essential (primary) hypertension 02/04/2023 No Yes I25.10 Atherosclerotic heart disease of native coronary artery without angina pectoris 02/04/2023 No Yes I50.42 Chronic combined systolic (congestive) and diastolic (congestive) heart failure 02/04/2023 No Yes Inactive Problems Resolved Problems Electronic Signature(s) Signed: 03/11/2023 2:44:09 PM By: Allen Derry PA-C Entered By: Allen Derry on 03/11/2023 14:44:09 -------------------------------------------------------------------------------- Progress Note Details Patient Name: Date of Service: Jamie Davenport RA R. 03/11/2023 2:30 PM Medical Record Number: [id number] Patient Account Number: [id number] Date of Birth/Sex: Treating RN: 28-Jan-1935 (88 y.o. [name] Primary Care Provider: Bari Edward Other Clinician: Referring Provider: Treating Provider/Extender: Francee Piccolo in Treatment: 5 Subjective Chief Complaint Information obtained from Patient Right LE Ulcer History of Present Illness (HPI) 02-04-2023 patient presents today for initial evaluation here in our clinic concerning a wound which actually began around October 24, 2022. With that being said she notes that this was due to an injury and then has continued to be a problem not wanting to close and heal as quickly as would be  expected. She is on long-term anticoagulant therapy due to atrial fibrillation. She also has hypertension, COPD, coronary artery disease, congestive heart failure, and chronic venous insufficiency bilaterally. With that being said I do believe that this wound is currently showing some signs of improvement I do not see any obvious evidence of infection right now though that something we will keep a close eye on she has been on Ceftin this was December 18, 2022. She is not on that currently. Again she is on Coumadin. 02-11-2023 upon evaluation today patient appears to be doing decently well currently in regard to her leg wound. She is still is having some buildup of slough and necrotic debris at this time. With that being said I do think that overall for 1 week's time she has done decently well. 02-18-2023 upon evaluation today patient appears to be doing well currently in regard to her  wound although there is slough and biofilm buildup she has continued to have issues here with is slowly making progress towards healing. Fortunately I do not see any signs of active infection locally nor systemically at this time. No fevers, chills, nausea, vomiting, or diarrhea. 02-25-2023 upon evaluation patient appears to be doing pretty well currently in regard to her wound which is showing signs of improvement slowly but surely. Fortunately I do not see any evidence of active infection locally nor systemically which is great news. No fevers, chills, nausea, vomiting, or diarrhea. JOSENID, RUBENS ([id number]) [id number] Page 6 of 8 03-04-2023 upon evaluation patient's wound is doing well. I do think that she is ready for application of the Apligraf today without improvement regarding on hand. I would hope that we can get this moving in the right direction hopefully quickly with the use of the Apligraf. 03-11-2023 upon evaluation today patient appears to be doing well currently in regard to her wound.  This is actually measuring smaller and looking much better. Fortunately I do not see any signs of active infection locally or systemically which is great news and in general I do think that we are making good headway towards complete closure. Objective Constitutional Well-nourished and well-hydrated in no acute distress. Vitals Time Taken: 2:26 PM, Height: 61 in, Weight: 108 lbs, BMI: 20.4, Temperature: 97.6 F, Pulse: 85 bpm, Respiratory Rate: 18 breaths/min, Blood Pressure: 102/61 mmHg. Respiratory normal breathing without difficulty. Psychiatric this patient is able to make decisions and demonstrates good insight into disease process. Alert and Oriented x 3. pleasant and cooperative. General Notes: Based on what I am seeing I do feel like the patient has made good progress here. The wound is much cleaner compared to where it was previous. Fortunately I do not see any signs of active infection locally or systemically which is great news. No fevers, chills, nausea, vomiting, or diarrhea. Integumentary (Hair, Skin) Wound #1 status is Open. Original cause of wound was Skin T ear/Laceration. The date acquired was: 10/24/2022. The wound has been in treatment 5 weeks. The wound is located on the Right,Lateral Lower Leg. The wound measures 3cm length x 1.2cm width x 0.2cm depth; 2.827cm^2 area and 0.565cm^3 volume. There is Fat Layer (Subcutaneous Tissue) exposed. There is no tunneling or undermining noted. There is a medium amount of serosanguineous drainage noted. There is medium (34-66%) pink granulation within the wound bed. There is a medium (34-66%) amount of necrotic tissue within the wound bed. Assessment Active Problems ICD-10 Laceration without foreign body, right lower leg, initial encounter Chronic venous hypertension (idiopathic) with ulcer and inflammation of right lower extremity Non-pressure chronic ulcer of other part of right lower leg with fat layer exposed Paroxysmal atrial  fibrillation Long term (current) use of anticoagulants Chronic obstructive pulmonary disease, unspecified Essential (primary) hypertension Atherosclerotic heart disease of native coronary artery without angina pectoris Chronic combined systolic (congestive) and diastolic (congestive) heart failure Procedures Wound #1 Pre-procedure diagnosis of Wound #1 is a Skin T located on the Right,Lateral Lower Leg. A skin graft procedure using a bioengineered skin substitute/cellular ear or tissue based product was performed by Allen Derry, PA-C with the following instrument(s): Blade, Forceps, and Scissors. Apligraf was applied and secured with Steri-Strips. 22 sq cm of product was utilized and 22 sq cm was wasted due to size of wound. Post Application, sorbact was applied. The procedure was tolerated well with a pain level of 0 throughout and a pain level of 0 following the procedure. Post  procedure Diagnosis Wound #1: Same as Pre-Procedure . Plan Follow-up Appointments: Return Appointment in 1 week. Bathing/ Shower/ Hygiene: Clean wound with Normal Saline or wound cleanser. May shower; gently cleanse wound with antibacterial soap, rinse and pat dry prior to dressing wounds No tub bath. Anesthetic (Use 'Patient Medications' Section for Anesthetic Order Entry): ESTANISLADA, LAROCCA ([id number]) [id number] Page 7 of 8 Lidocaine applied to wound bed Cellular or Tissue Based Products: Cellular or Tissue Based Product Type: - apligraf applied #2 Cellular or Tissue Based Product applied to wound bed; including contact layer, fixation with steri-strips, dry gauze and cover dressing. (DO NOT REMOVE). - change to sorbact steri strips abd pad roll guaze and tubi grip size c Edema Control - Lymphedema / Segmental Compressive Device / Other: Elevate, Exercise Daily and Avoid Standing for Long Periods of Time. Elevate leg(s) parallel to the floor when sitting. DO YOUR BEST to sleep in  the bed at night. DO NOT sleep in your recliner. Long hours of sitting in a recliner leads to swelling of the legs and/or potential wounds on your backside. 1. Based on what I am seeing I did go ahead and recommend for the patient that we go ahead and we apply the Apligraf. I think that a lot of irritation she has is mainly just due to drainage but I am going to bring her back next week for reevaluation. I will reapply the Apligraf at that time as well. I would traditionally go for 2 weeks at a time at this point but I want to make sure that we are still doing well and that there is no signs of any infection and that her drainage is much better controlled with the new way of dressing this. 2. I did secure with Sorbact The Mepitel and Steri-Strips and then subsequently I did also utilize the ABD pad and roll gauze to secure in place followed by Tubigrip which I think would be much better and notes more cumbersome but at the same time it will catch the drainage and keep her from being too wet this should be changed every other day for now next week we may just leave it until it needs to be changed if it even does but we will see how things go over this week. We will see patient back for reevaluation in 1 week here in the clinic. If anything worsens or changes patient will contact our office for additional recommendations. We will plan to have another Apligraf for her next week. Electronic Signature(s) Signed: 03/11/2023 4:05:20 PM By: Allen Derry PA-C Entered By: Allen Derry on 03/11/2023 16:05:20 -------------------------------------------------------------------------------- SuperBill Details Patient Name: Date of Service: Jamie Davenport RA R. 03/11/2023 Medical Record Number: [id number] Patient Account Number: [id number] Date of Birth/Sex: Treating RN: 1935/02/24 (88 y.o. [name] Primary Care Provider: Bari Edward Other Clinician: Referring Provider: Treating Provider/Extender: Francee Piccolo in Treatment: 5 Diagnosis Coding ICD-10 Codes Code Description [phone] Laceration without foreign body, right lower leg, initial encounter I87.331 Chronic venous hypertension (idiopathic) with ulcer and inflammation of right lower extremity L97.812 Non-pressure chronic ulcer of other part of right lower leg with fat layer exposed I48.0 Paroxysmal atrial fibrillation Z79.01 Long term (current) use of anticoagulants J44.9 Chronic obstructive pulmonary disease, unspecified I10 Essential (primary) hypertension 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 Facility Procedures : CPT4 Code: [id number] Description: [phone] (Facility Use Only) Apligraf 44 SQ CM  Modifier: Quantity: 44 : Dierks, N CPT4 Code: [id number] ORA R ([id number]) Description: 15271 - SKIN SUB GRAFT TRNK/ARM/LEG ICD-10 Diagnosis Description L97.812 Non-pressure chronic ulcer of other part of right lower leg with fat layer expose [phone] Modifier: [id number] Pag Quantity: 1 e 8 of 8 Physician Procedures : CPT4 Code Description Modifier [id number] 15271 - WC PHYS SKIN SUB GRAFT TRNK/ARM/LEG ICD-10 Diagnosis Description L97.812 Non-pressure chronic ulcer of other part of right lower leg with fat layer exposed Quantity: 1 Electronic Signature(s) Signed: 03/11/2023 4:05:28 PM By: Allen Derry PA-C Entered By: Allen Derry on 03/11/2023 16:05:28

## 2023-03-11 NOTE — Progress Notes (Addendum)
[name], [name] ([id number]) [id number] Page 1 of 8 Visit Report for 03/11/2023 Arrival Information Details Patient Name: Date of Service: [name] 03/11/2023 2:30 PM Medical Record Number: [id number] Patient Account Number: [id number] Date of Birth/Sex: Treating RN: 11-20-1934 (88 y.o. Freddy Finner Primary Care : Bari Edward Other Clinician: Referring : Treating /Extender: Francee Piccolo in Treatment: 5 Visit Information History Since Last Visit Added or deleted any medications: No Patient Arrived: [name] Any new allergies or adverse reactions: No Arrival Time: 14:25 Had a fall or experienced change in No Accompanied By: son activities of daily living that may affect Transfer Assistance: None risk of falls: Patient Identification Verified: Yes Signs or symptoms of abuse/neglect since last visito No Secondary Verification Process Completed: Yes Hospitalized since last visit: No Patient Has Alerts: Yes Implantable device outside of the clinic excluding No Patient Alerts: Patient on Blood Thinner cellular tissue based products placed in the center PACEMAKER since last visit: 02/13/23 [name] Has Dressing in Place as Prescribed: Yes 02/13/23 Jamie L 1.26 Pain Present Now: No 02/13/23 [name] 0.91 02/13/23 Jamie L 0.86 Electronic Signature(s) Signed: 03/11/2023 3:15:47 PM By: Yevonne Pax RN Entered By: Yevonne Pax on 03/11/2023 14:26:19 -------------------------------------------------------------------------------- Clinic Level of Care Assessment Details Patient Name: Date of Service: [name], [name] 03/11/2023 2:30 PM Medical Record Number: [id number] Patient Account Number: [id number] Date of Birth/Sex: Treating RN: 02-17-1935 (88 y.o. Freddy Finner Primary Care : Bari Edward Other Clinician: Referring : Treating /Extender: Francee Piccolo in Treatment:  5 Clinic Level of Care Assessment Items TOOL 1 Quantity Score []  - 0 Use when EandM and Procedure is performed on INITIAL visit ASSESSMENTS - Nursing Assessment / Reassessment []  - 0 General Physical Exam (combine w/ comprehensive assessment (listed just below) when performed on new pt. evals) []  - 0 Comprehensive Assessment (HX, ROS, Risk Assessments, Wounds Hx, etc.) [name], [name] ([id number]) [id number] Page 2 of 8 ASSESSMENTS - Wound and Skin Assessment / Reassessment []  - 0 Dermatologic / Skin Assessment (not related to wound area) ASSESSMENTS - Ostomy and/or Continence Assessment and Care []  - 0 Incontinence Assessment and Management []  - 0 Ostomy Care Assessment and Management (repouching, etc.) PROCESS - Coordination of Care []  - 0 Simple Patient / Family Education for ongoing care []  - 0 Complex (extensive) Patient / Family Education for ongoing care []  - 0 Staff obtains Chiropractor, Records, T Results / Process Orders est []  - 0 Staff telephones HHA, Nursing Homes / Clarify orders / etc []  - 0 Routine Transfer to another Facility (non-emergent condition) []  - 0 Routine Hospital Admission (non-emergent condition) []  - 0 New Admissions / Manufacturing engineer / Ordering NPWT Apligraf, etc. , []  - 0 Emergency Hospital Admission (emergent condition) PROCESS - Special Needs []  - 0 Pediatric / Minor Patient Management []  - 0 Isolation Patient Management []  - 0 Hearing / Language / Visual special needs []  - 0 Assessment of Community assistance (transportation, D/C planning, etc.) []  - 0 Additional assistance / Altered mentation []  - 0 Support Surface(s) Assessment (bed, cushion, seat, etc.) INTERVENTIONS - Miscellaneous []  - 0 External ear exam []  - 0 Patient Transfer (multiple staff / Nurse, adult / Similar devices) []  - 0 Simple Staple / Suture removal (25 or less) []  - 0 Complex Staple / Suture removal (26 or more) []  -  0 Hypo/Hyperglycemic Management (do not check if billed separately) []  - 0 Ankle / Brachial Index (Jamie) -  do not check if billed separately Has the patient been seen at the hospital within the last three years: Yes Total Score: 0 Level Of Care: ____ Electronic Signature(s) Signed: 04/15/2023 3:49:02 PM By: Yevonne Pax RN Entered By: Yevonne Pax on 03/11/2023 15:33:50 -------------------------------------------------------------------------------- Encounter Discharge Information Details Patient Name: Date of Service: Jamie Davenport RA [name]. 03/11/2023 2:30 PM Medical Record Number: [id number] Patient Account Number: [id number] Date of Birth/Sex: Treating RN: [birth year] (88 y.o. Freddy Finner Primary Care : Bari Edward Other Clinician: Referring : Treating /Extender: Francee Piccolo in Treatment: 5 [name] ([id number]) [id number] Page 3 of 8 Encounter Discharge Information Items Post Procedure Vitals Discharge Condition: Stable Temperature (F): 97.6 Ambulatory Status: Ambulatory Pulse (bpm): 85 Discharge Destination: Home Respiratory Rate (breaths/min): 18 Transportation: Private Auto Blood Pressure (mmHg): 102/61 Accompanied By: husband Schedule Follow-up Appointment: Yes Clinical Summary of Care: Electronic Signature(s) Signed: 04/15/2023 3:49:02 PM By: Yevonne Pax RN Entered By: Yevonne Pax on 03/11/2023 15:34:37 -------------------------------------------------------------------------------- Lower Extremity Assessment Details Patient Name: Date of Service: [name], Jamie RA [name]. 03/11/2023 2:30 PM Medical Record Number: [id number] Patient Account Number: [id number] Date of Birth/Sex: Treating RN: Feb 01, 1935 (88 y.o. Freddy Finner Primary Care : Bari Edward Other Clinician: Referring : Treating /Extender: Francee Piccolo in Treatment: 5 Edema  Assessment Assessed: [Left: No] [Right: No] Edema: [Left: N] [Right: o] Calf Left: Right: Point of Measurement: 32 cm From Medial Instep 28 cm Ankle Left: Right: Point of Measurement: 11 cm From Medial Instep 18 cm Vascular Assessment Pulses: Dorsalis Pedis Palpable: [Right:Yes] Electronic Signature(s) Signed: 03/11/2023 3:15:47 PM By: Yevonne Pax RN Entered By: Yevonne Pax on 03/11/2023 14:32:46 Campoy, Mardene Sayer ([id number]) [id number] Page 4 of 8 -------------------------------------------------------------------------------- Multi Wound Chart Details Patient Name: Date of Service: [name], [name] 03/11/2023 2:30 PM Medical Record Number: [id number] Patient Account Number: [id number] Date of Birth/Sex: Treating RN: July 09, 1935 (88 y.o. Freddy Finner Primary Care : Bari Edward Other Clinician: Referring : Treating /Extender: Francee Piccolo in Treatment: 5 Vital Signs Height(in): 61 Pulse(bpm): 85 Weight(lbs): 108 Blood Pressure(mmHg): 102/61 Body Mass Index(BMI): 20.4 Temperature(F): 97.6 Respiratory Rate(breaths/min): 18 [1:Photos:] [N/A:N/A] Right, Lateral Lower Leg N/A N/A Wound Location: Skin Tear/Laceration N/A N/A Wounding Event: Skin Tear N/A N/A Primary Etiology: Chronic Obstructive Pulmonary N/A N/A Comorbid History: Disease (COPD), Arrhythmia, Congestive Heart Failure, Coronary Artery Disease, Hypertension, Received Radiation 10/24/2022 N/A N/A Date Acquired: 5 N/A N/A Weeks of Treatment: Open N/A N/A Wound Status: No N/A N/A Wound Recurrence: 3x0.2x0.2 N/A N/A Measurements L x W x D (cm) 0.471 N/A N/A A (cm) : rea 0.094 N/A N/A Volume (cm) : 89.90% N/A N/A % Reduction in Area: 79.90% N/A N/A % Reduction in Volume: Full Thickness Without Exposed N/A N/A Classification: Support Structures Medium N/A N/A Exudate A mount: Serosanguineous N/A N/A Exudate Type: red,  brown N/A N/A Exudate Color: Medium (34-66%) N/A N/A Granulation A mount: Pink N/A N/A Granulation Quality: Medium (34-66%) N/A N/A Necrotic A mount: Fat Layer (Subcutaneous Tissue): Yes N/A N/A Exposed Structures: None N/A N/A Epithelialization: Treatment Notes Electronic Signature(s) Signed: 03/11/2023 3:12:04 PM By: Yevonne Pax RN Entered By: Yevonne Pax on 03/11/2023 15:12:04 [name] ([id number]) [id number] Page 5 of 8 -------------------------------------------------------------------------------- Multi-Disciplinary Care Plan Details Patient Name: Date of Service: [name], [name] 03/11/2023 2:30 PM Medical Record Number: [id number] Patient Account Number: [id number] Date of Birth/Sex: Treating RN: 24-Jul-1935 (88 y.o. Freddy Finner Primary Care : Bari Edward Other Clinician:  Referring : Treating /Extender: Francee Piccolo in Treatment: 5 Active Inactive Wound/Skin Impairment Nursing Diagnoses: Impaired tissue integrity Knowledge deficit related to ulceration/compromised skin integrity Goals: Ulcer/skin breakdown will have a volume reduction of 30% by week 4 Date Initiated: 02/04/2023 Date Inactivated: 03/04/2023 Target Resolution Date: 03/04/2023 Goal Status: Unmet Unmet Reason: comorbidities Ulcer/skin breakdown will have a volume reduction of 50% by week 8 Date Initiated: 02/04/2023 Target Resolution Date: 04/01/2023 Goal Status: Active Ulcer/skin breakdown will have a volume reduction of 80% by week 12 Date Initiated: 02/04/2023 Target Resolution Date: 04/29/2023 Goal Status: Active Ulcer/skin breakdown will heal within 14 weeks Date Initiated: 02/04/2023 Target Resolution Date: 05/13/2023 Goal Status: Active Interventions: Assess patient/caregiver ability to obtain necessary supplies Assess patient/caregiver ability to perform ulcer/skin care regimen upon admission and as needed Assess  ulceration(s) every visit Provide education on ulcer and skin care Treatment Activities: Skin care regimen initiated : 02/04/2023 Notes: Electronic Signature(s) Signed: 03/11/2023 3:15:47 PM By: Yevonne Pax RN Entered By: Yevonne Pax on 03/11/2023 14:33:07 Pain Assessment Details -------------------------------------------------------------------------------- [name] ([id number]) [id number] Page 6 of 8 Patient Name: Date of Service: [name], [name] 03/11/2023 2:30 PM Medical Record Number: [id number] Patient Account Number: [id number] Date of Birth/Sex: Treating RN: 04-25-1935 (88 y.o. Freddy Finner Primary Care : Bari Edward Other Clinician: Referring : Treating /Extender: Francee Piccolo in Treatment: 5 Active Problems Location of Pain Severity and Description of Pain Patient Has Paino No Site Locations Pain Management and Medication Current Pain Management: Electronic Signature(s) Signed: 03/11/2023 3:15:47 PM By: Yevonne Pax RN Entered By: Yevonne Pax on 03/11/2023 14:27:15 -------------------------------------------------------------------------------- Patient/Caregiver Education Details Patient Name: Date of Service: [name] 7/2/2024andnbsp2:30 PM Medical Record Number: [id number] Patient Account Number: [id number] Date of Birth/Gender: Treating RN: 1934/12/26 (88 y.o. Freddy Finner Primary Care Physician: Bari Edward Other Clinician: Referring Physician: Treating Physician/Extender: Francee Piccolo in Treatment: 5 Education Assessment Education Provided To: Patient Education Topics Provided Wound Debridement: Handouts: Wound Debridement, Other: biofilm Methods: Explain/Verbal Responses: State content correctly MARTEKA, [place] ([id number]) [id number] Page 7 of 8 Electronic Signature(s) Signed: 03/11/2023 3:15:47 PM By: Yevonne Pax  RN Entered By: Yevonne Pax on 03/11/2023 14:33:27 -------------------------------------------------------------------------------- Wound Assessment Details Patient Name: Date of Service: [name], Jamie RA [name]. 03/11/2023 2:30 PM Medical Record Number: [id number] Patient Account Number: [id number] Date of Birth/Sex: Treating RN: 06-16-1935 (88 y.o. Freddy Finner Primary Care : Bari Edward Other Clinician: Referring : Treating /Extender: Francee Piccolo in Treatment: 5 Wound Status Wound Number: 1 Primary Skin T ear Etiology: Wound Location: Right, Lateral Lower Leg Wound Open Wounding Event: Skin Tear/Laceration Status: Date Acquired: 10/24/2022 Comorbid Chronic Obstructive Pulmonary Disease (COPD), Arrhythmia, Weeks Of Treatment: 5 History: Congestive Heart Failure, Coronary Artery Disease, Hypertension, Clustered Wound: No Received Radiation Photos Wound Measurements Length: (cm) 3 Width: (cm) 1.2 Depth: (cm) 0.2 Area: (cm) 2.827 Volume: (cm) 0.565 % Reduction in Area: 39.4% % Reduction in Volume: -21% Epithelialization: None Tunneling: No Undermining: No Wound Description Classification: Full Thickness Without Exposed Suppor Exudate Amount: Medium Exudate Type: Serosanguineous Exudate Color: red, brown t Structures Foul Odor After Cleansing: No Slough/Fibrino Yes Wound Bed Granulation Amount: Medium (34-66%) Exposed Structure Granulation Quality: Pink Fat Layer (Subcutaneous Tissue) Exposed: Yes Necrotic Amount: Medium (34-66%) Electronic Signature(s) Signed: 04/15/2023 3:49:02 PM By: Yevonne Pax RN Previous Signature: 03/11/2023 3:15:47 PM Version By: Yevonne Pax RN Entered By: Yevonne Pax on 03/11/2023 16:02:10 [name] ([id number]) [id number]  Page 8 of 8 -------------------------------------------------------------------------------- Vitals Details Patient Name: Date of Service: KELLYANNE, FREUDENTHAL 03/11/2023 2:30 PM Medical Record Number: [id number] Patient Account Number: [id number] Date of Birth/Sex: Treating RN: [birth year] (88 y.o. Freddy Finner Primary Care : Bari Edward Other Clinician: Referring : Treating /Extender: Francee Piccolo in Treatment: 5 Vital Signs Time Taken: 14:26 Temperature (F): 97.6 Height (in): 61 Pulse (bpm): 85 Weight (lbs): 108 Respiratory Rate (breaths/min): 18 Body Mass Index (BMI): 20.4 Blood Pressure (mmHg): 102/61 Reference Range: 80 - 120 mg / dl Electronic Signature(s) Signed: 03/11/2023 3:15:47 PM By: Yevonne Pax RN Entered By: Yevonne Pax on 03/11/2023 14:26:56

## 2023-03-12 ENCOUNTER — Other Ambulatory Visit: Payer: Self-pay

## 2023-03-12 MED ORDER — ENTRESTO 49-51 MG PO TABS: ORAL_TABLET | ORAL | 3 refills | Status: DC

## 2023-03-20 ENCOUNTER — Ambulatory Visit: Payer: Medicare Other | Admitting: Physician Assistant

## 2023-03-20 DIAGNOSIS — S81811A Laceration without foreign body, right lower leg, initial encounter: Secondary | ICD-10-CM | POA: Diagnosis not present

## 2023-03-20 NOTE — Progress Notes (Addendum)
[name] ([id number]) [id number] Page 1 of 9 Visit Report for 03/20/2023 Chief Complaint Document Details Patient Name: Date of Service: [name] 03/20/2023 2:00 PM Medical Record Number: [id number] Patient Account Number: [id number] Date of Birth/Sex: Treating RN: [birth year] (88 y.o. [name] Primary Care Provider: Bari Edward Other Clinician: Referring Provider: Treating Provider/Extender: Francee Piccolo in Treatment: 6 Information Obtained from: Patient Chief Complaint Right LE Ulcer Electronic Signature(s) Signed: 03/20/2023 1:48:03 PM By: Allen Derry PA-C Entered By: Allen Derry on 03/20/2023 13:48:03 -------------------------------------------------------------------------------- Cellular or Tissue Based Product Details Patient Name: Date of Service: [name] 03/20/2023 2:00 PM Medical Record Number: [id number] Patient Account Number: [id number] Date of Birth/Sex: Treating RN: Feb 13, 1935 (88 y.o. [name] Primary Care Provider: Bari Edward Other Clinician: Referring Provider: Treating Provider/Extender: Francee Piccolo in Treatment: 6 Cellular or Tissue Based Product Type Wound #1 Right,Lateral Lower Leg Applied to: Performed By: Physician Allen Derry, PA-C Cellular or Tissue Based Product Type: Apligraf Level of Consciousness (Pre-procedure): Awake and Alert Pre-procedure Verification/Time Out Yes - 14:29 Taken: Location: trunk / arms / legs Wound Size (sq cm): 5.89 Product Size (sq cm): 44 Waste Size (sq cm): 33 Amount of Product Applied (sq cm): 11 Instrument Used: Blade, Forceps, Scissors Lot #: [id number] Expiration Date: 03/27/2023 Fenestrated: Yes Instrument: Blade ReconstitutedAMIERA, [name] ([id number]) [id number] Page 2 of 9 Solution Type: saline Solution Amount: 10 Lot #: [id number] [place] Solution Expiration Date:  09/30/2024 Secured: Yes Secured With: Steri-Strips Dressing Applied: Yes Primary Dressing: mepitel Procedural Pain: 0 Post Procedural Pain: 0 Response to Treatment: Procedure was tolerated well Level of Consciousness (Post- Awake and Alert procedure): Post Procedure Diagnosis Same as Pre-procedure Electronic Signature(s) Signed: 03/20/2023 4:12:51 PM By: Angelina Pih Entered By: Angelina Pih on 03/20/2023 14:33:00 -------------------------------------------------------------------------------- Debridement Details Patient Name: Date of Service: [name] 03/20/2023 2:00 PM Medical Record Number: [id number] Patient Account Number: [id number] Date of Birth/Sex: Treating RN: February 12, 1935 (88 y.o. [name] Primary Care Provider: Bari Edward Other Clinician: Referring Provider: Treating Provider/Extender: Francee Piccolo in Treatment: 6 Debridement Performed for Assessment: Wound #1 Right,Lateral Lower Leg Performed By: Physician Allen Derry, PA-C Debridement Type: Debridement Level of Consciousness (Pre-procedure): Awake and Alert Pre-procedure Verification/Time Out Yes - 14:25 Taken: Pain Control: Lidocaine 4% T opical Solution Percent of Wound Bed Debrided: 100% T Area Debrided (cm): otal 4.62 Tissue and other material debrided: Viable, Non-Viable, Slough, Subcutaneous, Slough Level: Skin/Subcutaneous Tissue Debridement Description: Excisional Instrument: Curette Bleeding: Moderate Hemostasis Achieved: Pressure Response to Treatment: Procedure was tolerated well Level of Consciousness (Post- Awake and Alert procedure): Post Debridement Measurements of Total Wound Length: (cm) 3.1 Width: (cm) 1.9 Depth: (cm) 0.2 Volume: (cm) 0.925 Character of Wound/Ulcer Post Debridement: Stable Post Procedure Diagnosis Same as Pre-procedure Electronic Signature(s) Signed: 03/20/2023 4:12:51 PM By: Kathie Rhodes, Mardene Sayer ([id number])  [id number] Page 3 of 9 Signed: 03/20/2023 5:17:09 PM By: Allen Derry PA-C Entered By: Angelina Pih on 03/20/2023 14:25:41 -------------------------------------------------------------------------------- HPI Details Patient Name: Date of Service: [name] 03/20/2023 2:00 PM Medical Record Number: [id number] Patient Account Number: [id number] Date of Birth/Sex: Treating RN: 1935-03-05 (88 y.o. [name] Primary Care Provider: Bari Edward Other Clinician: Referring Provider: Treating Provider/Extender: Francee Piccolo in Treatment: 6 History of Present Illness HPI Description: 02-04-2023 patient presents today for initial evaluation here in our clinic concerning a wound which actually began around October 24, 2022. With  that being said she notes that this was due to an injury and then has continued to be a problem not wanting to close and heal as quickly as would be expected. She is on long-term anticoagulant therapy due to atrial fibrillation. She also has hypertension, COPD, coronary artery disease, congestive heart failure, and chronic venous insufficiency bilaterally. With that being said I do believe that this wound is currently showing some signs of improvement I do not see any obvious evidence of infection right now though that something we will keep a close eye on she has been on Ceftin this was December 18, 2022. She is not on that currently. Again she is on Coumadin. 02-11-2023 upon evaluation today patient appears to be doing decently well currently in regard to her leg wound. She is still is having some buildup of slough and necrotic debris at this time. With that being said I do think that overall for 1 week's time she has done decently well. 02-18-2023 upon evaluation today patient appears to be doing well currently in regard to her wound although there is slough and biofilm buildup she has continued to have issues here  with is slowly making progress towards healing. Fortunately I do not see any signs of active infection locally nor systemically at this time. No fevers, chills, nausea, vomiting, or diarrhea. 02-25-2023 upon evaluation patient appears to be doing pretty well currently in regard to her wound which is showing signs of improvement slowly but surely. Fortunately I do not see any evidence of active infection locally nor systemically which is great news. No fevers, chills, nausea, vomiting, or diarrhea. 03-04-2023 upon evaluation patient's wound is doing well. I do think that she is ready for application of the Apligraf today without improvement regarding on hand. I would hope that we can get this moving in the right direction hopefully quickly with the use of the Apligraf. 03-11-2023 upon evaluation today patient appears to be doing well currently in regard to her wound. This is actually measuring smaller and looking much better. Fortunately I do not see any signs of active infection locally or systemically which is great news and in general I do think that we are making good headway towards complete closure. 03-20-2023 upon evaluation today patient's wound actually is showing signs of improvement based on what we are seeing at this point. I do believe that she is making good headway towards getting this closed. I believe the Apligraf has been beneficial for her at this time and I am very pleased in that regard. The wound looks significantly improved both in the overall appearance as well as the overall size and I am extremely happy at this time. I do believe that we should continue with the Apligraf as such as I do feel this is really doing quite well. Electronic Signature(s) Signed: 03/20/2023 2:55:05 PM By: Allen Derry PA-C Entered By: Allen Derry on 03/20/2023 14:55:05 -------------------------------------------------------------------------------- Physical Exam Details Patient Name: Date of  Service: KORRIN, WATERFIELD RA [name]. 03/20/2023 2:00 PM Medical Record Number: [id number] Patient Account Number: [id number] [name], [name] ([id number]) [id number].pdf Page 4 of 9 Date of Birth/Sex: Treating RN: [birth year] (88 y.o. [name] Primary Care Provider: Other Clinician: Bari Edward Referring Provider: Treating Provider/Extender: Francee Piccolo in Treatment: 6 Constitutional Well-nourished and well-hydrated in no acute distress. Respiratory normal breathing without difficulty. Psychiatric this patient is able to make decisions and demonstrates good insight into disease process. Alert and Oriented x 3. pleasant and cooperative. Notes  Upon inspection patient's wound bed did require sharp debridement to clearway some of the necrotic debris I cleared this away and then subsequently apply the Apligraf today. She tolerated this without complication and postdebridement the wound bed appears to be doing much better which is excellent news. I then reapply the Apligraf we Argun to use the Mepitel as to be honest with the Sorbact this became much too dry I would cut slits however in the Mepitel so that it does not dry out as badly she is in agreement with that plan. Electronic Signature(s) Signed: 03/20/2023 2:55:34 PM By: Allen Derry PA-C Entered By: Allen Derry on 03/20/2023 14:55:34 -------------------------------------------------------------------------------- Physician Orders Details Patient Name: Date of Service: [name] 03/20/2023 2:00 PM Medical Record Number: [id number] Patient Account Number: [id number] Date of Birth/Sex: Treating RN: 1935-05-17 (88 y.o. [name] Primary Care Provider: Bari Edward Other Clinician: Referring Provider: Treating Provider/Extender: Francee Piccolo in Treatment: 6 Verbal / Phone Orders: No Diagnosis Coding ICD-10 Coding Code Description [id number] Laceration  without foreign body, right lower leg, initial encounter I87.331 Chronic venous hypertension (idiopathic) with ulcer and inflammation of right lower extremity L97.812 Non-pressure chronic ulcer of other part of right lower leg with fat layer exposed I48.0 Paroxysmal atrial fibrillation Z79.01 Long term (current) use of anticoagulants J44.9 Chronic obstructive pulmonary disease, unspecified I10 Essential (primary) hypertension 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 Follow-up Appointments Return Appointment in 2 weeks. Bathing/ Shower/ Hygiene Clean wound with Normal Saline or wound cleanser. May shower; gently cleanse wound with antibacterial soap, rinse and pat dry prior to dressing wounds No tub bath. Anesthetic (Use 'Patient Medications' Section for Anesthetic Order Entry) Lidocaine applied to wound bed LARESA, OSHIRO [name] ([id number]) [id number] Page 5 of 9 Cellular or Tissue Based Products Cellular or Tissue Based Product Type: - apligraf applied #3 Cellular or Tissue Based Product applied to wound bed; including contact layer, fixation with steri-strips, dry gauze and cover dressing. (DO NOT REMOVE). - steri strips, mepitel, abd pad roll guaze and tubi grip size c. Spouse educated on if looking dry to apply KY gel and change outer dressing PRN and at least once next week. Edema Control - Lymphedema / Segmental Compressive Device / Other Elevate, Exercise Daily and A void Standing for Long Periods of Time. Elevate leg(s) parallel to the floor when sitting. DO YOUR BEST to sleep in the bed at night. DO NOT sleep in your recliner. Long hours of sitting in a recliner leads to swelling of the legs and/or potential wounds on your backside. Wound Treatment Wound #1 - Lower Leg Wound Laterality: Right, Lateral Secondary Dressing: ABD  Pad 5x9 (in/in) 1 x Per Week/30 Days Discharge Instructions: Cover with ABD pad Secured With: Medipore T - 17M Medipore H Soft Cloth Surgical T ape ape, 2x2 (in/yd) 1 x Per Week/30 Days Secured With: [employer] or Non-Sterile 6-ply 4.5x4 (yd/yd) 1 x Per Week/30 Days Discharge Instructions: Apply Kerlix as directed Secured With: Tubigrip Size C, 2.75x10 (in/yd) 1 x Per Week/30 Days Discharge Instructions: Apply 3 Tubigrip C 3-finger-widths below knee to base of toes to secure dressing and/or for swelling. Electronic Signature(s) Signed: 03/20/2023 4:12:51 PM By: Angelina Pih Signed: 03/20/2023 5:17:09 PM By: Allen Derry PA-C Entered By: Angelina Pih on 03/20/2023 14:37:57 -------------------------------------------------------------------------------- Problem List Details Patient Name: Date of Service: [name] 03/20/2023 2:00 PM Medical Record Number: [id number]  Patient Account Number: [id number] Date of Birth/Sex: Treating RN: [birth year] (88 y.o. [name] Primary Care Provider: Bari Edward Other Clinician: Referring Provider: Treating Provider/Extender: Francee Piccolo in Treatment: 6 Active Problems ICD-10 Encounter Code Description Active Date MDM Diagnosis S81.811A Laceration without foreign body, right lower leg, initial encounter 02/04/2023 No Yes I87.331 Chronic venous hypertension (idiopathic) with ulcer and inflammation of right 02/04/2023 No Yes lower extremity L97.812 Non-pressure chronic ulcer of other part of right lower leg with fat layer 02/04/2023 No Yes exposed I48.0 Paroxysmal atrial fibrillation 02/04/2023 No Yes SHYNIA, DALEO [name] ([id number]) [phone].pdf Page 6 of 9 Z79.01 Long term (current) use of anticoagulants 02/04/2023 No Yes J44.9 Chronic obstructive pulmonary disease, unspecified 02/04/2023 No Yes I10 Essential (primary) hypertension 02/04/2023 No Yes I25.10 Atherosclerotic heart  disease of native coronary artery without angina pectoris 02/04/2023 No Yes I50.42 Chronic combined systolic (congestive) and diastolic (congestive) heart failure 02/04/2023 No Yes Inactive Problems Resolved Problems Electronic Signature(s) Signed: 03/20/2023 1:48:01 PM By: Allen Derry PA-C Entered By: Allen Derry on 03/20/2023 13:48:01 -------------------------------------------------------------------------------- Progress Note Details Patient Name: Date of Service: [name] 03/20/2023 2:00 PM Medical Record Number: [id number] Patient Account Number: [id number] Date of Birth/Sex: Treating RN: 1935/05/16 (88 y.o. [name] Primary Care Provider: Bari Edward Other Clinician: Referring Provider: Treating Provider/Extender: Francee Piccolo in Treatment: 6 Subjective Chief Complaint Information obtained from Patient Right LE Ulcer History of Present Illness (HPI) 02-04-2023 patient presents today for initial evaluation here in our clinic concerning a wound which actually began around October 24, 2022. With that being said she notes that this was due to an injury and then has continued to be a problem not wanting to close and heal as quickly as would be expected. She is on long-term anticoagulant therapy due to atrial fibrillation. She also has hypertension, COPD, coronary artery disease, congestive heart failure, and chronic venous insufficiency bilaterally. With that being said I do believe that this wound is currently showing some signs of improvement I do not see any obvious evidence of infection right now though that something we will keep a close eye on she has been on Ceftin this was December 18, 2022. She is not on that currently. Again she is on Coumadin. 02-11-2023 upon evaluation today patient appears to be doing decently well currently in regard to her leg wound. She is still is having some buildup of slough and necrotic debris at this time. With that  being said I do think that overall for 1 week's time she has done decently well. 02-18-2023 upon evaluation today patient appears to be doing well currently in regard to her wound although there is slough and biofilm buildup she has continued to have issues here with is slowly making progress towards healing. Fortunately I do not see any signs of active infection locally nor systemically at this time. No fevers, chills, nausea, vomiting, or diarrhea. 02-25-2023 upon evaluation patient appears to be doing pretty well currently in regard to her wound which is showing signs of improvement slowly but surely. Fortunately I do not see any evidence of active infection locally nor systemically which is great news. No fevers, chills, nausea, vomiting, or diarrhea. 03-04-2023 upon evaluation patient's wound is doing well. I do think that she is ready for application of the Apligraf today without improvement regarding on [name], [name] ([id number]) [phone].pdf Page 7 of 9 hand. I would hope that we can get this moving in the right direction hopefully  quickly with the use of the Apligraf. 03-11-2023 upon evaluation today patient appears to be doing well currently in regard to her wound. This is actually measuring smaller and looking much better. Fortunately I do not see any signs of active infection locally or systemically which is great news and in general I do think that we are making good headway towards complete closure. 03-20-2023 upon evaluation today patient's wound actually is showing signs of improvement based on what we are seeing at this point. I do believe that she is making good headway towards getting this closed. I believe the Apligraf has been beneficial for her at this time and I am very pleased in that regard. The wound looks significantly improved both in the overall appearance as well as the overall size and I am extremely happy at this time. I do believe that we  should continue with the Apligraf as such as I do feel this is really doing quite well. Objective Constitutional Well-nourished and well-hydrated in no acute distress. Vitals Time Taken: 1:46 PM, Height: 61 in, Weight: 108 lbs, BMI: 20.4, Temperature: 97.5 F, Pulse: 81 bpm, Respiratory Rate: 18 breaths/min, Blood Pressure: 113/54 mmHg. Respiratory normal breathing without difficulty. Psychiatric this patient is able to make decisions and demonstrates good insight into disease process. Alert and Oriented x 3. pleasant and cooperative. General Notes: Upon inspection patient's wound bed did require sharp debridement to clearway some of the necrotic debris I cleared this away and then subsequently apply the Apligraf today. She tolerated this without complication and postdebridement the wound bed appears to be doing much better which is excellent news. I then reapply the Apligraf we Argun to use the Mepitel as to be honest with the Sorbact this became much too dry I would cut slits however in the Mepitel so that it does not dry out as badly she is in agreement with that plan. Integumentary (Hair, Skin) Wound #1 status is Open. Original cause of wound was Skin T ear/Laceration. The date acquired was: 10/24/2022. The wound has been in treatment 6 weeks. The wound is located on the Right,Lateral Lower Leg. The wound measures 3.1cm length x 1.9cm width x 0.2cm depth; 4.626cm^2 area and 0.925cm^3 volume. There is Fat Layer (Subcutaneous Tissue) exposed. There is no tunneling or undermining noted. There is a medium amount of serosanguineous drainage noted. There is medium (34-66%) pink granulation within the wound bed. There is a medium (34-66%) amount of necrotic tissue within the wound bed including Adherent Slough. Assessment Active Problems ICD-10 Laceration without foreign body, right lower leg, initial encounter Chronic venous hypertension (idiopathic) with ulcer and inflammation of right lower  extremity Non-pressure chronic ulcer of other part of right lower leg with fat layer exposed Paroxysmal atrial fibrillation Long term (current) use of anticoagulants Chronic obstructive pulmonary disease, unspecified Essential (primary) hypertension Atherosclerotic heart disease of native coronary artery without angina pectoris Chronic combined systolic (congestive) and diastolic (congestive) heart failure Procedures Wound #1 Pre-procedure diagnosis of Wound #1 is a Skin T located on the Right,Lateral Lower Leg . There was a Excisional Skin/Subcutaneous Tissue Debridement ear with a total area of 4.62 sq cm performed by Allen Derry, PA-C. With the following instrument(s): Curette to remove Viable and Non-Viable tissue/material. Material removed includes Subcutaneous Tissue and Slough and after achieving pain control using Lidocaine 4% T opical Solution. No specimens were taken. A time out was conducted at 14:25, prior to the start of the procedure. A Moderate amount of bleeding was controlled with Pressure.  The procedure was tolerated well. Post Debridement Measurements: 3.1cm length x 1.9cm width x 0.2cm depth; 0.925cm^3 volume. Character of Wound/Ulcer Post Debridement is stable. Post procedure Diagnosis Wound #1: Same as Pre-Procedure Pre-procedure diagnosis of Wound #1 is a Skin T located on the Right,Lateral Lower Leg. A skin graft procedure using a bioengineered skin substitute/cellular ear or tissue based product was performed by Allen Derry, PA-C with the following instrument(s): Blade, Forceps, and Scissors. Apligraf was applied and secured with Steri-Strips. 11 sq cm of product was utilized and 33 sq cm was wasted. Post Application, mepitel was applied. A Time Out was conducted at 14:29, prior to the start of the procedure. The procedure was tolerated well with a pain level of 0 throughout and a pain level of 0 following the procedure. Post procedure Diagnosis Wound #1: Same as  Pre-Procedure . [name], [name] ([id number]) [id number] Page 8 of 9 Plan Follow-up Appointments: Return Appointment in 2 weeks. Bathing/ Shower/ Hygiene: Clean wound with Normal Saline or wound cleanser. May shower; gently cleanse wound with antibacterial soap, rinse and pat dry prior to dressing wounds No tub bath. Anesthetic (Use 'Patient Medications' Section for Anesthetic Order Entry): Lidocaine applied to wound bed Cellular or Tissue Based Products: Cellular or Tissue Based Product Type: - apligraf applied #3 Cellular or Tissue Based Product applied to wound bed; including contact layer, fixation with steri-strips, dry gauze and cover dressing. (DO NOT REMOVE). - steri strips, mepitel, abd pad roll guaze and tubi grip size c. Spouse educated on if looking dry to apply KY gel and change outer dressing PRN and at least once next week. Edema Control - Lymphedema / Segmental Compressive Device / Other: Elevate, Exercise Daily and Avoid Standing for Long Periods of Time. Elevate leg(s) parallel to the floor when sitting. DO YOUR BEST to sleep in the bed at night. DO NOT sleep in your recliner. Long hours of sitting in a recliner leads to swelling of the legs and/or potential wounds on your backside. WOUND #1: - Lower Leg Wound Laterality: Right, Lateral Secondary Dressing: ABD Pad 5x9 (in/in) 1 x Per Week/30 Days Discharge Instructions: Cover with ABD pad Secured With: Medipore T - 68M Medipore H Soft Cloth Surgical T ape ape, 2x2 (in/yd) 1 x Per Week/30 Days Secured With: [employer] or Non-Sterile 6-ply 4.5x4 (yd/yd) 1 x Per Week/30 Days Discharge Instructions: Apply Kerlix as directed Secured With: Tubigrip Size C, 2.75x10 (in/yd) 1 x Per Week/30 Days Discharge Instructions: Apply 3 Tubigrip C 3-finger-widths below knee to base of toes to secure dressing and/or for swelling. 1. I would recommend currently that we have the patient continue to monitor  for any signs of infection or worsening. Based on what I am seeing I do think that we are making headway towards closure. 2. I am going to recommend that we going to apply the Apligraf this is application #3 today we will leave this in place for 2 weeks. 3. I am also going to recommend that the patient should continue to elevate her legs and use the Tubigrip size C which I think is doing well. We will see patient back for reevaluation in 1 week here in the clinic. If anything worsens or changes patient will contact our office for additional recommendations. Electronic Signature(s) Signed: 03/20/2023 2:56:00 PM By: Allen Derry PA-C Entered By: Allen Derry on 03/20/2023 14:56:00 -------------------------------------------------------------------------------- SuperBill Details Patient Name: Date of Service: [name] 03/20/2023 Medical Record Number: [id number] Patient Account Number: [id number] Date  of Birth/Sex: Treating RN: [birth year] (88 y.o. [name] Primary Care Provider: Bari Edward Other Clinician: Referring Provider: Treating Provider/Extender: Francee Piccolo in Treatment: 6 Diagnosis Coding ICD-10 Codes Code Description [phone] Laceration without foreign body, right lower leg, initial encounter I87.331 Chronic venous hypertension (idiopathic) with ulcer and inflammation of right lower extremity L97.812 Non-pressure chronic ulcer of other part of right lower leg with fat layer exposed I48.0 Paroxysmal atrial fibrillation [name], [name] ([id number]) [id number] Page 9 of 9 Z79.01 Long term (current) use of anticoagulants J44.9 Chronic obstructive pulmonary disease, unspecified I10 Essential (primary) hypertension 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 Facility Procedures : CPT4  Code: 01027253 Description: [id number] (Facility Use Only) Apligraf 44 SQ CM Modifier: Quantity: 44 : CPT4 Code: 34742595 Description: 15271 - SKIN SUB GRAFT TRNK/ARM/LEG ICD-10 Diagnosis Description L97.812 Non-pressure chronic ulcer of other part of right lower leg with fat layer expo Modifier: sed Quantity: 1 Physician Procedures : CPT4 Code Description Modifier [id number] 15271 - WC PHYS SKIN SUB GRAFT TRNK/ARM/LEG ICD-10 Diagnosis Description L97.812 Non-pressure chronic ulcer of other part of right lower leg with fat layer exposed Quantity: 1 Electronic Signature(s) Signed: 03/20/2023 2:58:09 PM By: Allen Derry PA-C Entered By: Allen Derry on 03/20/2023 14:58:09

## 2023-03-20 NOTE — Progress Notes (Signed)
Remote pacemaker transmission.   

## 2023-03-20 NOTE — Progress Notes (Signed)
ELDORIS, BEISER ([id number]) [id number] Page 1 of 8 Visit Report for 03/20/2023 Arrival Information Details Patient Name: Date of Service: Jamie, FAIDLEY RA [name]. 03/20/2023 2:00 PM Medical Record Number: [id number] Patient Account Number: [id number] Date of Birth/Sex: Treating RN: 1934/09/10 (88 y.o. [name] Primary Care Cristel Rail: Bari Edward Other Clinician: Referring Hubbert Landrigan: Treating Saed Hudlow/Extender: Francee Piccolo in Treatment: 6 Visit Information History Since Last Visit Added or deleted any medications: No Patient Arrived: Ambulatory Any new allergies or adverse reactions: No Arrival Time: 13:44 Had a fall or experienced change in No Accompanied By: self activities of daily living that may affect Transfer Assistance: None risk of falls: Patient Identification Verified: Yes Hospitalized since last visit: No Secondary Verification Process Completed: Yes Has Dressing in Place as Prescribed: Yes Patient Has Alerts: Yes Has Compression in Place as Prescribed: Yes Patient Alerts: Patient on Blood Thinner Pain Present Now: No PACEMAKER 02/13/23 ABI [name] 02/13/23 ABI L 1.26 02/13/23 TBI [name] 0.91 02/13/23 TBI L 0.86 Electronic Signature(s) Signed: 03/20/2023 4:12:51 PM By: Angelina Pih Entered By: Angelina Pih on 03/20/2023 13:45:59 -------------------------------------------------------------------------------- Clinic Level of Care Assessment Details Patient Name: Date of Service: Jamie, LIGGINS RA [name]. 03/20/2023 2:00 PM Medical Record Number: [id number] Patient Account Number: [id number] Date of Birth/Sex: Treating RN: 13-Mar-1935 (88 y.o. [name] Primary Care Celicia Minahan: Bari Edward Other Clinician: Referring Corderius Saraceni: Treating Tanielle Emigh/Extender: Francee Piccolo in Treatment: 6 Clinic Level of Care Assessment Items TOOL 1 Quantity Score []  - 0 Use when EandM and Procedure is performed on INITIAL  visit ASSESSMENTS - Nursing Assessment / Reassessment []  - 0 General Physical Exam (combine w/ comprehensive assessment (listed just below) when performed on new pt. evals) []  - 0 Comprehensive Assessment (HX, ROS, Risk Assessments, Wounds Hx, etc.) Jamie, SEVERINO [name] ([id number]) [id number] Page 2 of 8 ASSESSMENTS - Wound and Skin Assessment / Reassessment []  - 0 Dermatologic / Skin Assessment (not related to wound area) ASSESSMENTS - Ostomy and/or Continence Assessment and Care []  - 0 Incontinence Assessment and Management []  - 0 Ostomy Care Assessment and Management (repouching, etc.) PROCESS - Coordination of Care []  - 0 Simple Patient / Family Education for ongoing care []  - 0 Complex (extensive) Patient / Family Education for ongoing care []  - 0 Staff obtains Chiropractor, Records, T Results / Process Orders est []  - 0 Staff telephones HHA, Nursing Homes / Clarify orders / etc []  - 0 Routine Transfer to another Facility (non-emergent condition) []  - 0 Routine Hospital Admission (non-emergent condition) []  - 0 New Admissions / Manufacturing engineer / Ordering NPWT Apligraf, etc. , []  - 0 Emergency Hospital Admission (emergent condition) PROCESS - Special Needs []  - 0 Pediatric / Minor Patient Management []  - 0 Isolation Patient Management []  - 0 Hearing / Language / Visual special needs []  - 0 Assessment of Community assistance (transportation, D/C planning, etc.) []  - 0 Additional assistance / Altered mentation []  - 0 Support Surface(s) Assessment (bed, cushion, seat, etc.) INTERVENTIONS - Miscellaneous []  - 0 External ear exam []  - 0 Patient Transfer (multiple staff / Nurse, adult / Similar devices) []  - 0 Simple Staple / Suture removal (25 or less) []  - 0 Complex Staple / Suture removal (26 or more) []  - 0 Hypo/Hyperglycemic Management (do not check if billed separately) []  - 0 Ankle / Brachial Index (ABI) - do not check if billed  separately Has the patient been seen at the hospital within the last three years: Yes Total Score:  0 Level Of Care: ____ Electronic Signature(s) Signed: 03/20/2023 4:12:51 PM By: Angelina Pih Entered By: Angelina Pih on 03/20/2023 14:35:03 -------------------------------------------------------------------------------- Encounter Discharge Information Details Patient Name: Date of Service: [name] 03/20/2023 2:00 PM Medical Record Number: [id number] Patient Account Number: [id number] Date of Birth/Sex: Treating RN: Jan 11, 1935 (88 y.o. [name] Primary Care Matthias Bogus: Bari Edward Other Clinician: Referring Ethelle Ola: Treating Lemmie Steinhaus/Extender: Francee Piccolo in Treatment: [address] [name] ([id number]) [id number] Page 3 of 8 Encounter Discharge Information Items Post Procedure Vitals Discharge Condition: Stable Temperature (F): 97.5 Ambulatory Status: Cane Pulse (bpm): 81 Discharge Destination: Home Respiratory Rate (breaths/min): 18 Transportation: Private Auto Blood Pressure (mmHg): 113/54 Accompanied By: spouse Schedule Follow-up Appointment: Yes Clinical Summary of Care: Electronic Signature(s) Signed: 03/20/2023 4:12:51 PM By: Angelina Pih Entered By: Angelina Pih on 03/20/2023 14:36:20 -------------------------------------------------------------------------------- Lower Extremity Assessment Details Patient Name: Date of Service: TAMIYA, COLELLO RA [name]. 03/20/2023 2:00 PM Medical Record Number: [id number] Patient Account Number: [id number] Date of Birth/Sex: Treating RN: [birth year] (88 y.o. [name] Primary Care Zi Sek: Bari Edward Other Clinician: Referring Keatin Benham: Treating Makesha Belitz/Extender: Francee Piccolo in Treatment: 6 Edema Assessment Assessed: [Left: No] [Right: No] Edema: [Left: N] [Right: o] Calf Left: Right: Point of Measurement: 32 cm From Medial Instep  26.7 cm Ankle Left: Right: Point of Measurement: 11 cm From Medial Instep 17.5 cm Vascular Assessment Pulses: Dorsalis Pedis Palpable: [Right:Yes] Electronic Signature(s) Signed: 03/20/2023 4:12:51 PM By: Angelina Pih Entered By: Angelina Pih on 03/20/2023 14:23:30 Carma Lair [name] ([id number]) [id number] Page 4 of 8 -------------------------------------------------------------------------------- Multi Wound Chart Details Patient Name: Date of Service: Jamie, BUI RA [name]. 03/20/2023 2:00 PM Medical Record Number: [id number] Patient Account Number: [id number] Date of Birth/Sex: Treating RN: Sep 15, 1934 (88 y.o. [name] Primary Care Jaizon Deroos: Bari Edward Other Clinician: Referring Onie Kasparek: Treating Daylynn Stumpp/Extender: Francee Piccolo in Treatment: 6 Vital Signs Height(in): 61 Pulse(bpm): 81 Weight(lbs): 108 Blood Pressure(mmHg): 113/54 Body Mass Index(BMI): 20.4 Temperature(F): 97.5 Respiratory Rate(breaths/min): 18 [1:Photos:] [N/A:N/A] Right, Lateral Lower Leg N/A N/A Wound Location: Skin Tear/Laceration N/A N/A Wounding Event: Skin Tear N/A N/A Primary Etiology: Chronic Obstructive Pulmonary N/A N/A Comorbid History: Disease (COPD), Arrhythmia, Congestive Heart Failure, Coronary Artery Disease, Hypertension, Received Radiation 10/24/2022 N/A N/A Date Acquired: 6 N/A N/A Weeks of Treatment: Open N/A N/A Wound Status: No N/A N/A Wound Recurrence: 3.1x1.9x0.2 N/A N/A Measurements L x W x D (cm) 4.626 N/A N/A A (cm) : rea 0.925 N/A N/A Volume (cm) : 0.80% N/A N/A % Reduction in Area: -98.10% N/A N/A % Reduction in Volume: Full Thickness Without Exposed N/A N/A Classification: Support Structures Medium N/A N/A Exudate A mount: Serosanguineous N/A N/A Exudate Type: red, brown N/A N/A Exudate Color: Medium (34-66%) N/A N/A Granulation A mount: Pink N/A N/A Granulation Quality: Medium (34-66%)  N/A N/A Necrotic A mount: Fat Layer (Subcutaneous Tissue): Yes N/A N/A Exposed Structures: None N/A N/A Epithelialization: Treatment Notes Electronic Signature(s) Signed: 03/20/2023 4:12:51 PM By: Angelina Pih Entered By: Angelina Pih on 03/20/2023 14:23:36 Badie, Mardene Sayer ([id number]) [id number] Page 5 of 8 -------------------------------------------------------------------------------- Multi-Disciplinary Care Plan Details Patient Name: Date of Service: Jamie, VENTOLA RA [name]. 03/20/2023 2:00 PM Medical Record Number: [id number] Patient Account Number: [id number] Date of Birth/Sex: Treating RN: 03-06-1935 (88 y.o. [name] Primary Care Jakelin Taussig: Bari Edward Other Clinician: Referring Dominik Lauricella: Treating Travin Marik/Extender: Francee Piccolo in Treatment: 6 Active Inactive Wound/Skin Impairment Nursing Diagnoses: Impaired tissue integrity Knowledge deficit related to ulceration/compromised  skin integrity Goals: Ulcer/skin breakdown will have a volume reduction of 30% by week 4 Date Initiated: 02/04/2023 Date Inactivated: 03/04/2023 Target Resolution Date: 03/04/2023 Goal Status: Unmet Unmet Reason: comorbidities Ulcer/skin breakdown will have a volume reduction of 50% by week 8 Date Initiated: 02/04/2023 Target Resolution Date: 04/01/2023 Goal Status: Active Ulcer/skin breakdown will have a volume reduction of 80% by week 12 Date Initiated: 02/04/2023 Target Resolution Date: 04/29/2023 Goal Status: Active Ulcer/skin breakdown will heal within 14 weeks Date Initiated: 02/04/2023 Target Resolution Date: 05/13/2023 Goal Status: Active Interventions: Assess patient/caregiver ability to obtain necessary supplies Assess patient/caregiver ability to perform ulcer/skin care regimen upon admission and as needed Assess ulceration(s) every visit Provide education on ulcer and skin care Treatment Activities: Skin care regimen initiated :  02/04/2023 Notes: Electronic Signature(s) Signed: 03/20/2023 4:12:51 PM By: Angelina Pih Entered By: Angelina Pih on 03/20/2023 14:35:19 Pain Assessment Details -------------------------------------------------------------------------------- [name] ([id number]) [id number] Page 6 of 8 Patient Name: Date of Service: Jamie, LEITERMAN RA [name]. 03/20/2023 2:00 PM Medical Record Number: [id number] Patient Account Number: [id number] Date of Birth/Sex: Treating RN: 1934/10/31 (88 y.o. [name] Primary Care Evangaline Jou: Bari Edward Other Clinician: Referring Jermon Chalfant: Treating Takyah Ciaramitaro/Extender: Francee Piccolo in Treatment: 6 Active Problems Location of Pain Severity and Description of Pain Patient Has Paino No Site Locations Rate the pain. Current Pain Level: 0 Pain Management and Medication Current Pain Management: Electronic Signature(s) Signed: 03/20/2023 4:12:51 PM By: Angelina Pih Entered By: Angelina Pih on 03/20/2023 13:47:34 -------------------------------------------------------------------------------- Patient/Caregiver Education Details Patient Name: Date of Service: [name] 7/11/2024andnbsp2:00 PM Medical Record Number: [id number] Patient Account Number: [id number] Date of Birth/Gender: Treating RN: 12/25/1934 (88 y.o. [name] Primary Care Physician: Bari Edward Other Clinician: Referring Physician: Treating Physician/Extender: Francee Piccolo in Treatment: 6 Education Assessment Education Provided To: Patient Education Topics Provided Wound Debridement: Handouts: Wound Debridement Methods: Explain/Verbal Responses: State content correctly Wound/Skin ImpairmentNEELAH, MANNINGS [name] ([id number]) [id number] Page 7 of 8 Handouts: Caring for Your Ulcer Methods: Explain/Verbal Responses: State content correctly Electronic Signature(s) Signed:  03/20/2023 4:12:51 PM By: Angelina Pih Entered By: Angelina Pih on 03/20/2023 14:35:32 -------------------------------------------------------------------------------- Wound Assessment Details Patient Name: Date of Service: Jamie, GADWAY RA [name]. 03/20/2023 2:00 PM Medical Record Number: [id number] Patient Account Number: [id number] Date of Birth/Sex: Treating RN: 09/07/1935 (88 y.o. [name] Primary Care Sherree Shankman: Bari Edward Other Clinician: Referring Martin Belling: Treating Jeffry Vogelsang/Extender: Francee Piccolo in Treatment: 6 Wound Status Wound Number: 1 Primary Skin T ear Etiology: Wound Location: Right, Lateral Lower Leg Wound Open Wounding Event: Skin Tear/Laceration Status: Date Acquired: 10/24/2022 Comorbid Chronic Obstructive Pulmonary Disease (COPD), Arrhythmia, Weeks Of Treatment: 6 History: Congestive Heart Failure, Coronary Artery Disease, Hypertension, Clustered Wound: No Received Radiation Photos Wound Measurements Length: (cm) 3.1 Width: (cm) 1.9 Depth: (cm) 0.2 Area: (cm) 4.626 Volume: (cm) 0.925 % Reduction in Area: 0.8% % Reduction in Volume: -98.1% Epithelialization: None Tunneling: No Undermining: No Wound Description Classification: Full Thickness Without Exposed Suppor Exudate Amount: Medium Exudate Type: Serosanguineous Exudate Color: red, brown t Structures Foul Odor After Cleansing: No Slough/Fibrino Yes Wound Bed Granulation Amount: Medium (34-66%) Exposed Structure Granulation Quality: Pink Fat Layer (Subcutaneous Tissue) Exposed: Yes Necrotic Amount: Medium (34-66%) Necrotic Quality: [address], [place] [name] ([id number]) [id number] Page 8 of 8 Treatment Notes Wound #1 (Lower Leg) Wound Laterality: Right, Lateral Cleanser Peri-Wound Care Topical Primary Dressing Secondary Dressing ABD Pad 5x9 (in/in) Discharge Instruction: Cover with ABD pad Secured With  Medipore T - 38M  Medipore H Soft Cloth Surgical T ape ape, 2x2 (in/yd) Kerlix Roll Sterile or Non-Sterile 6-ply 4.5x4 (yd/yd) Discharge Instruction: Apply Kerlix as directed Tubigrip Size C, 2.75x10 (in/yd) Discharge Instruction: Apply 3 Tubigrip C 3-finger-widths below knee to base of toes to secure dressing and/or for swelling. Compression Wrap Compression Stockings Add-Ons Electronic Signature(s) Signed: 03/20/2023 4:12:51 PM By: Angelina Pih Entered By: Angelina Pih on 03/20/2023 13:59:48 -------------------------------------------------------------------------------- Vitals Details Patient Name: Date of Service: [name] 03/20/2023 2:00 PM Medical Record Number: [id number] Patient Account Number: [id number] Date of Birth/Sex: Treating RN: 1934/09/30 (88 y.o. [name] Primary Care Darly Massi: Bari Edward Other Clinician: Referring Caileb Rhue: Treating Raelene Trew/Extender: Francee Piccolo in Treatment: 6 Vital Signs Time Taken: 13:46 Temperature (F): 97.5 Height (in): 61 Pulse (bpm): 81 Weight (lbs): 108 Respiratory Rate (breaths/min): 18 Body Mass Index (BMI): 20.4 Blood Pressure (mmHg): 113/54 Reference Range: 80 - 120 mg / dl Electronic Signature(s) Signed: 03/20/2023 4:12:51 PM By: Angelina Pih Entered By: Angelina Pih on 03/20/2023 13:47:28

## 2023-03-22 ENCOUNTER — Other Ambulatory Visit: Payer: Self-pay | Admitting: Cardiovascular Disease

## 2023-03-24 ENCOUNTER — Encounter: Payer: Medicare Other | Admitting: Internal Medicine

## 2023-03-30 ENCOUNTER — Other Ambulatory Visit: Payer: Self-pay | Admitting: Cardiovascular Disease

## 2023-03-30 ENCOUNTER — Other Ambulatory Visit: Payer: Self-pay | Admitting: Internal Medicine

## 2023-03-30 DIAGNOSIS — K219 Gastro-esophageal reflux disease without esophagitis: Secondary | ICD-10-CM

## 2023-03-30 DIAGNOSIS — Z953 Presence of xenogenic heart valve: Secondary | ICD-10-CM

## 2023-03-30 DIAGNOSIS — L03115 Cellulitis of right lower limb: Secondary | ICD-10-CM

## 2023-03-30 DIAGNOSIS — E039 Hypothyroidism, unspecified: Secondary | ICD-10-CM

## 2023-03-31 NOTE — Telephone Encounter (Signed)
Please contact pt for future appointment with Dr. Fletcher Anon. Pt due for 6 month fu.

## 2023-03-31 NOTE — Telephone Encounter (Signed)
Refill Request.  

## 2023-03-31 NOTE — Telephone Encounter (Signed)
Refill request for warfarin:  Last INR was 2.3 on 03/05/23 Next INR due 04/16/23 LOV was 02/25/23  Refill approved.

## 2023-04-01 ENCOUNTER — Encounter: Payer: Medicare Other | Admitting: Internal Medicine

## 2023-04-01 DIAGNOSIS — S81811A Laceration without foreign body, right lower leg, initial encounter: Secondary | ICD-10-CM | POA: Diagnosis not present

## 2023-04-01 NOTE — Progress Notes (Addendum)
RHYTHM, GUBBELS ([id number]) [phone].pdf Page 1 of 7 Visit Report for 04/01/2023 Arrival Information Details Patient Name: Date of Service: [name], [name] 04/01/2023 3:00 PM Medical Record Number: [id number] Patient Account Number: [id number] Date of Birth/Sex: Treating RN: [birth year] (88 y.o. [name], [name] Primary Care Icie Kuznicki: Bari Edward Other Clinician: Referring Flara Storti: Treating Lafreda Casebeer/Extender: RO BSO Dorris Carnes, MICHA EL Audrie Lia in Treatment: 8 Visit Information History Since Last Visit Pain Present Now: No Patient Arrived: Cane Arrival Time: 14:58 Accompanied By: spouse Transfer Assistance: None Patient Has Alerts: Yes Patient Alerts: Patient on Blood Thinner PACEMAKER 02/13/23 ABI R 1.24 02/13/23 ABI L 1.26 02/13/23 TBI R 0.91 02/13/23 TBI L 0.86 Electronic Signature(s) Signed: 04/01/2023 3:20:29 PM By: Bonnell Public Entered By: Bonnell Public on 04/01/2023 15:03:08 -------------------------------------------------------------------------------- Encounter Discharge Information Details Patient Name: Date of Service: [name] RA R. 04/01/2023 3:00 PM Medical Record Number: [id number] Patient Account Number: [id number] Date of Birth/Sex: Treating RN: [birth year] (88 y.o. [name], [name] Primary Care Ronita Hargreaves: Bari Edward Other Clinician: Referring Kavin Weckwerth: Treating Adriene Knipfer/Extender: RO BSO N, MICHA EL Audrie Lia in Treatment: 8 Encounter Discharge Information Items Post Procedure Vitals Discharge Condition: Stable Temperature (F): 97.9 Ambulatory Status: Cane Pulse (bpm): 83 Discharge Destination: Home Respiratory Rate (breaths/min): 18 Transportation: Private Auto Blood Pressure (mmHg): 111/52 Accompanied By: spouse Schedule Follow-up Appointment: Yes Clinical Summary of Care: Electronic Signature(sMARRION, FINAN R ([id number]) [id number] Page 2 of 7 Signed: 04/01/2023 4:46:29 PM By:  Bonnell Public Entered By: Bonnell Public on 04/01/2023 16:07:37 -------------------------------------------------------------------------------- Lower Extremity Assessment Details Patient Name: Date of Service: [name], BRIENZA RA R. 04/01/2023 3:00 PM Medical Record Number: [id number] Patient Account Number: [id number] Date of Birth/Sex: Treating RN: 06-02-1935 (88 y.o. [name], [name] Primary Care Zyanne Schumm: Bari Edward Other Clinician: Referring Sevilla Murtagh: Treating Shatha Hooser/Extender: RO BSO N, MICHA EL Audrie Lia in Treatment: 8 Edema Assessment Assessed: [Left: No] [Right: No] Edema: [Left: N] [Right: o] Vascular Assessment Pulses: Dorsalis Pedis Palpable: [Right:Yes] Electronic Signature(s) Signed: 04/01/2023 3:20:29 PM By: Bonnell Public Entered By: Bonnell Public on 04/01/2023 15:14:32 -------------------------------------------------------------------------------- Multi Wound Chart Details Patient Name: Date of Service: [name] RA R. 04/01/2023 3:00 PM Medical Record Number: [id number] Patient Account Number: [id number] Date of Birth/Sex: Treating RN: [birth year] (88 y.o. [name], [name] Primary Care Zailen Albarran: Bari Edward Other Clinician: Referring Ryken Paschal: Treating Aileene Lanum/Extender: RO BSO N, MICHA EL Audrie Lia in Treatment: 8 Vital Signs Height(in): 61 Pulse(bpm): 83 Weight(lbs): 108 Blood Pressure(mmHg): 111/52 Body Mass Index(BMI): 20.4 Temperature(F): 97.9 Respiratory Rate(breaths/min): 18 [1:Photos:] [N/A:N/A] Right, Lateral Lower Leg N/A N/A Wound Location: Skin T ear/Laceration N/A N/A Wounding Event: Skin T ear N/A N/A Primary Etiology: Chronic Obstructive Pulmonary N/A N/A Comorbid History: Disease (COPD), Arrhythmia, Congestive Heart Failure, Coronary Artery Disease, Hypertension, Received Radiation 10/24/2022 N/A N/A Date Acquired: 8 N/A N/A Weeks of Treatment: Open N/A N/A Wound Status: No N/A N/A Wound  Recurrence: 2.5x1x0.1 N/A N/A Measurements L x W x D (cm) 1.963 N/A N/A A (cm) : rea 0.196 N/A N/A Volume (cm) : 57.90% N/A N/A % Reduction in Area: 58.00% N/A N/A % Reduction in Volume: Full Thickness Without Exposed N/A N/A Classification: Support Structures Small N/A N/A Exudate Amount: Serosanguineous N/A N/A Exudate Type: red, brown N/A N/A Exudate Color: Distinct, outline attached N/A N/A Wound Margin: Medium (34-66%) N/A N/A Granulation Amount: Pink N/A N/A Granulation Quality: None Present (0%) N/A N/A Necrotic Amount: Fat Layer (Subcutaneous Tissue): Yes N/A N/A Exposed Structures: Fascia: No  Tendon: No Muscle: No Joint: No Bone: No None N/A N/A Epithelialization: Treatment Notes Electronic Signature(s) Signed: 04/01/2023 3:20:29 PM By: Bonnell Public Entered By: Bonnell Public on 04/01/2023 15:14:43 -------------------------------------------------------------------------------- Multi-Disciplinary Care Plan Details Patient Name: Date of Service: [name] RA R. 04/01/2023 3:00 PM Medical Record Number: [id number] Patient Account Number: [id number] Date of Birth/Sex: Treating RN: 20-Dec-1934 (88 y.o. [name], [name] Primary Care Rene Gonsoulin: Bari Edward Other Clinician: Referring Letishia Elliott: Treating Tecora Eustache/Extender: RO BSO Dorris Carnes, MICHA EL Audrie Lia in Treatment: 8 Active Inactive Wound/Skin Impairment Nursing Diagnoses: KARYSA, HEFT ([id number]) [phone].pdf Page 4 of 7 Impaired tissue integrity Knowledge deficit related to ulceration/compromised skin integrity Goals: Ulcer/skin breakdown will have a volume reduction of 30% by week 4 Date Initiated: 02/04/2023 Date Inactivated: 03/04/2023 Target Resolution Date: 03/04/2023 Goal Status: Unmet Unmet Reason: comorbidities Ulcer/skin breakdown will have a volume reduction of 50% by week 8 Date Initiated: 02/04/2023 Target Resolution Date: 04/01/2023 Goal Status:  Active Ulcer/skin breakdown will have a volume reduction of 80% by week 12 Date Initiated: 02/04/2023 Target Resolution Date: 04/29/2023 Goal Status: Active Ulcer/skin breakdown will heal within 14 weeks Date Initiated: 02/04/2023 Target Resolution Date: 05/13/2023 Goal Status: Active Interventions: Assess patient/caregiver ability to obtain necessary supplies Assess patient/caregiver ability to perform ulcer/skin care regimen upon admission and as needed Assess ulceration(s) every visit Provide education on ulcer and skin care Treatment Activities: Skin care regimen initiated : 02/04/2023 Notes: Electronic Signature(s) Signed: 04/01/2023 4:46:29 PM By: Bonnell Public Entered By: Bonnell Public on 04/01/2023 16:05:33 -------------------------------------------------------------------------------- Pain Assessment Details Patient Name: Date of Service: [name], TYREE RA R. 04/01/2023 3:00 PM Medical Record Number: [id number] Patient Account Number: [id number] Date of Birth/Sex: Treating RN: August 19, 1935 (88 y.o. [name], [name] Primary Care Arleene Settle: Bari Edward Other Clinician: Referring Malakhai Beitler: Treating Ceilidh Torregrossa/Extender: RO BSO N, MICHA EL Audrie Lia in Treatment: 8 Active Problems Location of Pain Severity and Description of Pain Patient Has Paino No Site Locations HADAS, JESSOP R ([id number]) [id number] Page 5 of 7 Pain Management and Medication Current Pain Management: Electronic Signature(s) Signed: 04/01/2023 3:20:29 PM By: Bonnell Public Entered By: Bonnell Public on 04/01/2023 15:04:20 -------------------------------------------------------------------------------- Patient/Caregiver Education Details Patient Name: Date of Service: Westley Hummer 7/23/2024andnbsp3:00 PM Medical Record Number: [id number] Patient Account Number: [id number] Date of Birth/Gender: Treating RN: 01-Apr-1935 (88 y.o. [name], [name] Primary Care Physician:  Bari Edward Other Clinician: Referring Physician: Treating Physician/Extender: RO BSO Dorris Carnes, MICHA EL Audrie Lia in Treatment: 8 Education Assessment Education Provided To: Patient Education Topics Provided Wound/Skin Impairment: Handouts: Caring for Your Ulcer Methods: Explain/Verbal Responses: State content correctly Electronic Signature(s) Signed: 04/01/2023 4:46:29 PM By: Bonnell Public Entered By: Bonnell Public on 04/01/2023 16:05:40 Merri Ray ([id number]) [id number] Page 6 of 7 -------------------------------------------------------------------------------- Wound Assessment Details Patient Name: Date of Service: [name], SALAIS RA R. 04/01/2023 3:00 PM Medical Record Number: [id number] Patient Account Number: [id number] Date of Birth/Sex: Treating RN: 1935/01/27 (88 y.o. [name], [name] Primary Care Clemie General: Bari Edward Other Clinician: Referring Jamieka Royle: Treating Negin Hegg/Extender: RO BSO N, MICHA EL Audrie Lia in Treatment: 8 Wound Status Wound Number: 1 Primary Skin T ear Etiology: Wound Location: Right, Lateral Lower Leg Wound Open Wounding Event: Skin Tear/Laceration Status: Date Acquired: 10/24/2022 Comorbid Chronic Obstructive Pulmonary Disease (COPD), Arrhythmia, Weeks Of Treatment: 8 History: Congestive Heart Failure, Coronary Artery Disease, Hypertension, Clustered Wound: No Received Radiation Photos Wound Measurements Length: (cm) 2.5 Width: (cm) 1 Depth: (cm) 0.1 Area: (cm) 1.963 Volume: (cm) 0.196 %  Reduction in Area: 57.9% % Reduction in Volume: 58% Epithelialization: None Wound Description Classification: Full Thickness Without Exposed Suppor Wound Margin: Distinct, outline attached Exudate Amount: Small Exudate Type: Serosanguineous Exudate Color: red, brown t Structures Foul Odor After Cleansing: No Slough/Fibrino No Wound Bed Granulation Amount: Medium (34-66%) Exposed  Structure Granulation Quality: Pink Fascia Exposed: No Necrotic Amount: None Present (0%) Fat Layer (Subcutaneous Tissue) Exposed: Yes Tendon Exposed: No Muscle Exposed: No Joint Exposed: No Bone Exposed: No Treatment Notes Wound #1 (Lower Leg) Wound Laterality: Right, Lateral Cleanser Peri-Wound Care SINA, SUMPTER R ([id number]) [id number] Page 7 of 7 Topical Primary Dressing Secondary Dressing Conforming Guaze Roll-Small Discharge Instruction: Apply Conforming Stretch Guaze Bandage as directed Gauze Discharge Instruction: As directed: dry, moistened with saline or moistened with Dakins Solution Secured With Tubigrip Size C, 2.75x10 (in/yd) Discharge Instruction: Apply 3 Tubigrip C 3-finger-widths below knee to base of toes to secure dressing and/or for swelling. Compression Wrap Compression Stockings Add-Ons Electronic Signature(s) Signed: 04/01/2023 3:20:29 PM By: Bonnell Public Entered By: Bonnell Public on 04/01/2023 15:14:22 -------------------------------------------------------------------------------- Vitals Details Patient Name: Date of Service: [name] RA R. 04/01/2023 3:00 PM Medical Record Number: [id number] Patient Account Number: [id number] Date of Birth/Sex: Treating RN: May 29, 1935 (88 y.o. [name], [name] Primary Care Jennings Antolin: Bari Edward Other Clinician: Referring Moris Ratchford: Treating Ashlin Kreps/Extender: RO BSO N, MICHA EL Audrie Lia in Treatment: 8 Vital Signs Time Taken: 15:02 Temperature (F): 97.9 Height (in): 61 Pulse (bpm): 83 Weight (lbs): 108 Respiratory Rate (breaths/min): 18 Body Mass Index (BMI): 20.4 Blood Pressure (mmHg): 111/52 Reference Range: 80 - 120 mg / dl Electronic Signature(s) Signed: 04/01/2023 3:20:29 PM By: Bonnell Public Entered By: Bonnell Public on 04/01/2023 15:04:14

## 2023-04-01 NOTE — Progress Notes (Signed)
TARRAH, FURUTA ([id number]) [id number] Page 1 of 7 Visit Report for 04/01/2023 Cellular or Tissue Based Product Details Patient Name: Date of Service: [name], [name] 04/01/2023 3:00 PM Medical Record Number: [id number] Patient Account Number: [id number] Date of Birth/Sex: Treating RN: 20-Jul-1935 (88 y.o. Freddy Finner Primary Care Provider: Bari Edward Other Clinician: Referring Provider: Treating Provider/Extender: RO BSO Dorris Carnes, MICHA EL Audrie Lia in Treatment: 8 Cellular or Tissue Based Product Type Wound #1 Right,Lateral Lower Leg Applied to: Performed By: Physician Maxwell Caul, MD Cellular or Tissue Based Product Type: Apligraf Level of Consciousness (Pre-procedure): Awake and Alert Pre-procedure Verification/Time Out No Taken: Location: trunk / arms / legs Wound Size (sq cm): 2.5 Product Size (sq cm): 44 Waste Size (sq cm): 22 Waste Reason: size of wound Amount of Product Applied (sq cm): 22 Instrument Used: Blade, Forceps, Scissors Lot #: [id number] Order #: 4 Expiration Date: 04/09/2023 Fenestrated: Yes Instrument: Blade Reconstituted: Yes Solution Type: normal saline Solution Amount: 6 ml Lot #: [id number] KS Solution Expiration Date: 09/30/2024 Secured: Yes Secured With: Steri-Strips Dressing Applied: Yes Primary Dressing: adaptic Procedural Pain: 0 Post Procedural Pain: 0 Response to Treatment: Procedure was tolerated well Level of Consciousness (Post- Awake and Alert procedure): Post Procedure Diagnosis Same as Pre-procedure Electronic Signature(s) Signed: 04/01/2023 3:47:54 PM By: Yevonne Pax RN Entered By: Yevonne Pax on 04/01/2023 15:47:54 Spooner, Mardene Sayer ([id number]) [id number] Page 2 of 7 -------------------------------------------------------------------------------- HPI Details Patient Name: Date of Service: [name], Jamie RA R. 04/01/2023 3:00 PM Medical Record Number:  [id number] Patient Account Number: [id number] Date of Birth/Sex: Treating RN: 21-Nov-1934 (88 y.o. [name] Primary Care Provider: Bari Edward Other Clinician: Referring Provider: Treating Provider/Extender: RO BSO N, MICHA EL Audrie Lia in Treatment: 8 History of Present Illness HPI Description: 02-04-2023 patient presents today for initial evaluation here in our clinic concerning a wound which actually began around October 24, 2022. With that being said she notes that this was due to an injury and then has continued to be a problem not wanting to close and heal as quickly as would be expected. She is on long-term anticoagulant therapy due to atrial fibrillation. She also has hypertension, COPD, coronary artery disease, congestive heart failure, and chronic venous insufficiency bilaterally. With that being said I do believe that this wound is currently showing some signs of improvement I do not see any obvious evidence of infection right now though that something we will keep a close eye on she has been on Ceftin this was December 18, 2022. She is not on that currently. Again she is on Coumadin. 02-11-2023 upon evaluation today patient appears to be doing decently well currently in regard to her leg wound. She is still is having some buildup of slough and necrotic debris at this time. With that being said I do think that overall for 1 week's time she has done decently well. 02-18-2023 upon evaluation today patient appears to be doing well currently in regard to her wound although there is slough and biofilm buildup she has continued to have issues here with is slowly making progress towards healing. Fortunately I do not see any signs of active infection locally nor systemically at this time. No fevers, chills, nausea, vomiting, or diarrhea. 02-25-2023 upon evaluation patient appears to be doing pretty well currently in regard to her wound which is showing signs of improvement  slowly but surely. Fortunately I do not see any evidence of active infection locally nor systemically which is  great news. No fevers, chills, nausea, vomiting, or diarrhea. 03-04-2023 upon evaluation patient's wound is doing well. I do think that she is ready for application of the Apligraf today without improvement regarding on hand. I would hope that we can get this moving in the right direction hopefully quickly with the use of the Apligraf. 03-11-2023 upon evaluation today patient appears to be doing well currently in regard to her wound. This is actually measuring smaller and looking much better. Fortunately I do not see any signs of active infection locally or systemically which is great news and in general I do think that we are making good headway towards complete closure. 03-20-2023 upon evaluation today patient's wound actually is showing signs of improvement based on what we are seeing at this point. I do believe that she is making good headway towards getting this closed. I believe the Apligraf has been beneficial for her at this time and I am very pleased in that regard. The wound looks significantly improved both in the overall appearance as well as the overall size and I am extremely happy at this time. I do believe that we should continue with the Apligraf as such as I do feel this is really doing quite well. 7/23; traumatic wound in the setting of chronic venous insufficiency which was nonhealing. We applied Apligraf #4 today. Making nice improvements Electronic Signature(s) Signed: 04/01/2023 4:43:46 PM By: Baltazar Najjar MD Entered By: Baltazar Najjar on 04/01/2023 16:05:39 -------------------------------------------------------------------------------- Physical Exam Details Patient Name: Date of Service: Dois [name] RA R. 04/01/2023 3:00 PM Medical Record Number: [id number] Patient Account Number: [id number] Date of Birth/Sex: Treating RN: 09-Feb-1935 (88 y.o. [name] Primary  Care Provider: Bari Edward Other Clinician: Referring Provider: Treating Provider/Extender: [address], MICHA EL Ellise, [place], [place] R ([id number]) [phone].pdf Page 3 of 7 Weeks in Treatment: 8 Constitutional Sitting or standing Blood Pressure is within target range for patient.. Pulse regular and within target range for patient.Marland Kitchen Respirations regular, non-labored and within target range.. Temperature is normal and within the target range for the patient.Marland Kitchen appears in no distress. Notes Wound exam; right lateral lower leg. I washed off some debris of the surface. I used Vashe and gauze. I then applied the Apligraf secured with Mepitel. Apligraf was applied in the standard fashion Electronic Signature(s) Signed: 04/01/2023 4:43:46 PM By: Baltazar Najjar MD Entered By: Baltazar Najjar on 04/01/2023 16:06:31 -------------------------------------------------------------------------------- Physician Orders Details Patient Name: Date of Service: Dois [name] RA R. 04/01/2023 3:00 PM Medical Record Number: [id number] Patient Account Number: [id number] Date of Birth/Sex: Treating RN: 1935-07-29 (88 y.o. [name] Primary Care Provider: Bari Edward Other Clinician: Referring Provider: Treating Provider/Extender: RO BSO N, MICHA EL Audrie Lia in Treatment: 8 Verbal / Phone Orders: No Diagnosis Coding Follow-up Appointments Return Appointment in 2 weeks. ppointment in: - nurse visit in one week for dressing check Return A Bathing/ Shower/ Hygiene Clean wound with Normal Saline or wound cleanser. May shower; gently cleanse wound with antibacterial soap, rinse and pat dry prior to dressing wounds No tub bath. Anesthetic (Use 'Patient Medications' Section for Anesthetic Order Entry) Lidocaine applied to wound bed Cellular or Tissue Based Products Cellular or Tissue Based Product Type: - apligraf applied #4; order apligraf for  application in two weeks Cellular or Tissue Based Product applied to wound bed; including contact layer, fixation with steri-strips, dry gauze and cover dressing. (DO NOT REMOVE). - steri strips, mepitel, gauze, roll guaze and tubi grip size c.  Edema Control - Lymphedema / Segmental Compressive Device / Other Elevate, Exercise Daily and A void Standing for Long Periods of Time. Elevate leg(s) parallel to the floor when sitting. DO YOUR BEST to sleep in the bed at night. DO NOT sleep in your recliner. Long hours of sitting in a recliner leads to swelling of the legs and/or potential wounds on your backside. Wound Treatment Wound #1 - Lower Leg Wound Laterality: Right, Lateral Secondary Dressing: Conforming Guaze Roll-Small Other:every 2 weeks/30 Days Discharge Instructions: Apply Conforming Stretch Guaze Bandage as directed Secondary Dressing: Gauze Other:every 2 weeks/30 Days Discharge Instructions: As directed: dry, moistened with saline or moistened with Dakins Solution Secured With: Tubigrip Size C, 2.75x10 (in/yd) Other:every 2 weeks/30 Days Discharge Instructions: Apply 3 Tubigrip C 3-finger-widths below knee to base of toes to secure dressing and/or for swelling. LETHER, TESCH ([id number]) [id number] Page 4 of 7 Electronic Signature(s) Signed: 04/01/2023 4:43:46 PM By: Baltazar Najjar MD Signed: 04/01/2023 4:46:29 PM By: Bonnell Public Entered By: Bonnell Public on 04/01/2023 16:08:28 -------------------------------------------------------------------------------- Problem List Details Patient Name: Date of Service: Dois [name] RA R. 04/01/2023 3:00 PM Medical Record Number: [id number] Patient Account Number: [id number] Date of Birth/Sex: Treating RN: 19-Aug-1935 (88 y.o. [name] Primary Care Provider: Bari Edward Other Clinician: Referring Provider: Treating Provider/Extender: RO BSO N, MICHA EL Audrie Lia in Treatment: 8 Active  Problems ICD-10 Encounter Code Description Active Date MDM Diagnosis S81.811A Laceration without foreign body, right lower leg, initial encounter 02/04/2023 No Yes I87.331 Chronic venous hypertension (idiopathic) with ulcer and inflammation of right 02/04/2023 No Yes lower extremity L97.812 Non-pressure chronic ulcer of other part of right lower leg with fat layer 02/04/2023 No Yes exposed I48.0 Paroxysmal atrial fibrillation 02/04/2023 No Yes Z79.01 Long term (current) use of anticoagulants 02/04/2023 No Yes J44.9 Chronic obstructive pulmonary disease, unspecified 02/04/2023 No Yes I10 Essential (primary) hypertension 02/04/2023 No Yes I25.10 Atherosclerotic heart disease of native coronary artery without angina pectoris 02/04/2023 No Yes I50.42 Chronic combined systolic (congestive) and diastolic (congestive) heart failure 02/04/2023 No Yes Inactive Problems Resolved Problems SARYIAH, BENCOSME ([id number]) [id number] Page 5 of 7 Electronic Signature(s) Signed: 04/01/2023 4:43:46 PM By: Baltazar Najjar MD Entered By: Baltazar Najjar on 04/01/2023 16:02:35 -------------------------------------------------------------------------------- Progress Note Details Patient Name: Date of Service: Dois [name] RA R. 04/01/2023 3:00 PM Medical Record Number: [id number] Patient Account Number: [id number] Date of Birth/Sex: Treating RN: [birth year] (88 y.o. [name] Primary Care Provider: Bari Edward Other Clinician: Referring Provider: Treating Provider/Extender: RO BSO N, MICHA EL Audrie Lia in Treatment: 8 Subjective History of Present Illness (HPI) 02-04-2023 patient presents today for initial evaluation here in our clinic concerning a wound which actually began around October 24, 2022. With that being said she notes that this was due to an injury and then has continued to be a problem not wanting to close and heal as quickly as would be expected. She is  on long-term anticoagulant therapy due to atrial fibrillation. She also has hypertension, COPD, coronary artery disease, congestive heart failure, and chronic venous insufficiency bilaterally. With that being said I do believe that this wound is currently showing some signs of improvement I do not see any obvious evidence of infection right now though that something we will keep a close eye on she has been on Ceftin this was December 18, 2022. She is not on that currently. Again she is on Coumadin. 02-11-2023 upon evaluation today patient appears to be doing decently well currently in  regard to her leg wound. She is still is having some buildup of slough and necrotic debris at this time. With that being said I do think that overall for 1 week's time she has done decently well. 02-18-2023 upon evaluation today patient appears to be doing well currently in regard to her wound although there is slough and biofilm buildup she has continued to have issues here with is slowly making progress towards healing. Fortunately I do not see any signs of active infection locally nor systemically at this time. No fevers, chills, nausea, vomiting, or diarrhea. 02-25-2023 upon evaluation patient appears to be doing pretty well currently in regard to her wound which is showing signs of improvement slowly but surely. Fortunately I do not see any evidence of active infection locally nor systemically which is great news. No fevers, chills, nausea, vomiting, or diarrhea. 03-04-2023 upon evaluation patient's wound is doing well. I do think that she is ready for application of the Apligraf today without improvement regarding on hand. I would hope that we can get this moving in the right direction hopefully quickly with the use of the Apligraf. 03-11-2023 upon evaluation today patient appears to be doing well currently in regard to her wound. This is actually measuring smaller and looking much better. Fortunately I do not see any signs  of active infection locally or systemically which is great news and in general I do think that we are making good headway towards complete closure. 03-20-2023 upon evaluation today patient's wound actually is showing signs of improvement based on what we are seeing at this point. I do believe that she is making good headway towards getting this closed. I believe the Apligraf has been beneficial for her at this time and I am very pleased in that regard. The wound looks significantly improved both in the overall appearance as well as the overall size and I am extremely happy at this time. I do believe that we should continue with the Apligraf as such as I do feel this is really doing quite well. 7/23; traumatic wound in the setting of chronic venous insufficiency which was nonhealing. We applied Apligraf #4 today. Making nice improvements Objective Constitutional Sitting or standing Blood Pressure is within target range for patient.. Pulse regular and within target range for patient.Marland Kitchen Respirations regular, non-labored and within target range.. Temperature is normal and within the target range for the patient.Marland Kitchen appears in no distress. Vitals Time Taken: 3:02 PM, Height: 61 in, Weight: 108 lbs, BMI: 20.4, Temperature: 97.9 F, Pulse: 83 bpm, Respiratory Rate: 18 breaths/min, Blood Pressure: 111/52 mmHg. General Notes: Wound exam; right lateral lower leg. I washed off some debris of the surface. I used Vashe and gauze. I then applied the Apligraf secured with Mepitel. Apligraf was applied in the standard fashion JACOYA, BAUMAN ([id number]) [id number] Page 6 of 7 Integumentary (Hair, Skin) Wound #1 status is Open. Original cause of wound was Skin T ear/Laceration. The date acquired was: 10/24/2022. The wound has been in treatment 8 weeks. The wound is located on the Right,Lateral Lower Leg. The wound measures 2.5cm length x 1cm width x 0.1cm depth; 1.963cm^2 area and 0.196cm^3  volume. There is Fat Layer (Subcutaneous Tissue) exposed. There is a small amount of serosanguineous drainage noted. The wound margin is distinct with the outline attached to the wound base. There is medium (34-66%) pink granulation within the wound bed. There is no necrotic tissue within the wound bed. Assessment Active Problems ICD-10 Laceration without foreign body, right  lower leg, initial encounter Chronic venous hypertension (idiopathic) with ulcer and inflammation of right lower extremity Non-pressure chronic ulcer of other part of right lower leg with fat layer exposed Paroxysmal atrial fibrillation Long term (current) use of anticoagulants Chronic obstructive pulmonary disease, unspecified Essential (primary) hypertension Atherosclerotic heart disease of native coronary artery without angina pectoris Chronic combined systolic (congestive) and diastolic (congestive) heart failure Procedures Wound #1 Pre-procedure diagnosis of Wound #1 is a Skin T located on the Right,Lateral Lower Leg. A skin graft procedure using a bioengineered skin substitute/cellular ear or tissue based product was performed by Maxwell Caul, MD with the following instrument(s): Blade, Forceps, and Scissors. Apligraf was applied and secured with Steri-Strips. 22 sq cm of product was utilized and 22 sq cm was wasted due to size of wound. Post Application, adaptic was applied. The procedure was tolerated well with a pain level of 0 throughout and a pain level of 0 following the procedure. Post procedure Diagnosis Wound #1: Same as Pre-Procedure . Plan Follow-up Appointments: Return Appointment in 2 weeks. Return Appointment in: - nurse visit in one week for dressing check Bathing/ Shower/ Hygiene: Clean wound with Normal Saline or wound cleanser. May shower; gently cleanse wound with antibacterial soap, rinse and pat dry prior to dressing wounds No tub bath. Anesthetic (Use 'Patient Medications' Section  for Anesthetic Order Entry): Lidocaine applied to wound bed Cellular or Tissue Based Products: Cellular or Tissue Based Product Type: - apligraf applied #4 Cellular or Tissue Based Product applied to wound bed; including contact layer, fixation with steri-strips, dry gauze and cover dressing. (DO NOT REMOVE). - steri strips, mepitel, gauze, roll guaze and tubi grip size c. Edema Control - Lymphedema / Segmental Compressive Device / Other: Elevate, Exercise Daily and Avoid Standing for Long Periods of Time. Elevate leg(s) parallel to the floor when sitting. DO YOUR BEST to sleep in the bed at night. DO NOT sleep in your recliner. Long hours of sitting in a recliner leads to swelling of the legs and/or potential wounds on your backside. WOUND #1: - Lower Leg Wound Laterality: Right, Lateral Secondary Dressing: Conforming Guaze Roll-Small Other:every 2 weeks/30 Days Discharge Instructions: Apply Conforming Stretch Guaze Bandage as directed Secondary Dressing: Gauze Other:every 2 weeks/30 Days Discharge Instructions: As directed: dry, moistened with saline or moistened with Dakins Solution Secured With: Tubigrip Size C, 2.75x10 (in/yd) Other:every 2 weeks/30 Days Discharge Instructions: Apply 3 Tubigrip C 3-finger-widths below knee to base of toes to secure dressing and/or for swelling. Apligraf #4 secured with Mepitel under Tubigrip Electronic Signature(s) Signed: 04/01/2023 4:43:46 PM By: Baltazar Najjar MD Entered By: Baltazar Najjar on 04/01/2023 16:07:15 [name] ([id number]) [id number] Page 7 of 7 -------------------------------------------------------------------------------- SuperBill Details Patient Name: Date of Service: KIRSTEN, SPEARING 04/01/2023 Medical Record Number: [id number] Patient Account Number: [id number] Date of Birth/Sex: Treating RN: [birth year] (88 y.o. [name] Primary Care Provider: Bari Edward Other Clinician: Referring  Provider: Treating Provider/Extender: RO BSO N, MICHA EL Audrie Lia in Treatment: 8 Diagnosis Coding ICD-10 Codes Code Description [phone] Laceration without foreign body, right lower leg, initial encounter I87.331 Chronic venous hypertension (idiopathic) with ulcer and inflammation of right lower extremity L97.812 Non-pressure chronic ulcer of other part of right lower leg with fat layer exposed I48.0 Paroxysmal atrial fibrillation Z79.01 Long term (current) use of anticoagulants J44.9 Chronic obstructive pulmonary disease, unspecified I10 Essential (primary) hypertension I25.10 Atherosclerotic heart disease of native coronary artery without angina pectoris I50.42 Chronic combined systolic (congestive) and  diastolic (congestive) heart failure Facility Procedures : CPT4 Code: [id number] Description: [phone] (Facility Use Only) Apligraf 44 SQ CM Modifier: Quantity: 44 : CPT4 Code: [id number] Description: 15271 - SKIN SUB GRAFT TRNK/ARM/LEG ICD-10 Diagnosis Description I87.331 Chronic venous hypertension (idiopathic) with ulcer and inflammation of right lo Modifier: wer extremity Quantity: 1 Physician Procedures : CPT4 Code Description Modifier [id number] 15271 - WC PHYS SKIN SUB GRAFT TRNK/ARM/LEG ICD-10 Diagnosis Description I87.331 Chronic venous hypertension (idiopathic) with ulcer and inflammation of right lower extremity Quantity: 1 Electronic Signature(s) Signed: 04/01/2023 4:43:46 PM By: Baltazar Najjar MD Entered By: Baltazar Najjar on 04/01/2023 16:07:55

## 2023-04-06 ENCOUNTER — Other Ambulatory Visit: Payer: Self-pay | Admitting: Internal Medicine

## 2023-04-06 DIAGNOSIS — L03115 Cellulitis of right lower limb: Secondary | ICD-10-CM

## 2023-04-08 DIAGNOSIS — S81811A Laceration without foreign body, right lower leg, initial encounter: Secondary | ICD-10-CM | POA: Diagnosis not present

## 2023-04-08 NOTE — Progress Notes (Signed)
Jamie, RHEINHEIMER [name] ([id number]) [id number] Page 1 of 4 Visit Report for 04/08/2023 Arrival Information Details Patient Name: Date of Service: [name], [name] 04/08/2023 3:45 PM Medical Record Number: [id number] Patient Account Number: [id number] Date of Birth/Sex: Treating RN: June 13, 1935 (88 y.o. [name] Primary Care [name]: [name] Other Clinician: Referring [name]: Treating [name]/Extender: [name] in Treatment: 9 Visit Information History Since Last Visit Added or deleted any medications: No Patient Arrived: [name] Any new allergies or adverse reactions: No Arrival Time: 15:50 Had a fall or experienced change in No Accompanied By: spouse activities of daily living that may affect Transfer Assistance: None risk of falls: Patient Identification Verified: Yes Hospitalized since last visit: No Secondary Verification Process Completed: Yes Has Dressing in Place as Prescribed: Yes Patient Has Alerts: Yes Has Compression in Place as Prescribed: Yes Patient Alerts: Patient on Blood Thinner Pain Present Now: No PACEMAKER 02/13/23 ABI [name] 02/13/23 ABI L 1.26 02/13/23 TBI [name] 0.91 02/13/23 TBI L 0.86 Electronic Signature(s) Signed: 04/08/2023 4:22:24 PM By: [name] Entered By: [name] on 04/08/2023 16:22:23 -------------------------------------------------------------------------------- Clinic Level of Care Assessment Details Patient Name: Date of Service: [name], [name] 04/08/2023 3:45 PM Medical Record Number: [id number] Patient Account Number: [id number] Date of Birth/Sex: Treating RN: [birth year] (88 y.o. [name] Primary Care Emani Taussig: [name] Other Clinician: Referring Kennan Detter: Treating Annarae Macnair/Extender: [name] in Treatment: 9 Clinic Level of Care Assessment Items TOOL 4 Quantity Score []  - 0 Use when only an EandM is performed on FOLLOW-UP  visit ASSESSMENTS - Nursing Assessment / Reassessment X- 1 10 Reassessment of Co-morbidities (includes updates in patient status) X- 1 5 Reassessment of Adherence to Treatment Plan [name], [name] ([id number]) [phone].pdf Page 2 of 4 ASSESSMENTS - Wound and Skin A ssessment / Reassessment X - Simple Wound Assessment / Reassessment - one wound 1 5 []  - 0 Complex Wound Assessment / Reassessment - multiple wounds []  - 0 Dermatologic / Skin Assessment (not related to wound area) ASSESSMENTS - Focused Assessment []  - 0 Circumferential Edema Measurements - multi extremities []  - 0 Nutritional Assessment / Counseling / Intervention []  - 0 Lower Extremity Assessment (monofilament, tuning fork, pulses) []  - 0 Peripheral Arterial Disease Assessment (using hand held doppler) ASSESSMENTS - Ostomy and/or Continence Assessment and Care []  - 0 Incontinence Assessment and Management []  - 0 Ostomy Care Assessment and Management (repouching, etc.) PROCESS - Coordination of Care X - Simple Patient / Family Education for ongoing care 1 15 []  - 0 Complex (extensive) Patient / Family Education for ongoing care []  - 0 Staff obtains Chiropractor, Records, T Results / Process Orders est []  - 0 Staff telephones HHA, Nursing Homes / Clarify orders / etc []  - 0 Routine Transfer to another Facility (non-emergent condition) []  - 0 Routine Hospital Admission (non-emergent condition) []  - 0 New Admissions / Manufacturing engineer / Ordering NPWT Apligraf, etc. , []  - 0 Emergency Hospital Admission (emergent condition) X- 1 10 Simple Discharge Coordination []  - 0 Complex (extensive) Discharge Coordination PROCESS - Special Needs []  - 0 Pediatric / Minor Patient Management []  - 0 Isolation Patient Management []  - 0 Hearing / Language / Visual special needs []  - 0 Assessment of Community assistance (transportation, D/C planning, etc.) []  - 0 Additional assistance /  Altered mentation []  - 0 Support Surface(s) Assessment (bed, cushion, seat, etc.) INTERVENTIONS - Wound Cleansing / Measurement X - Simple Wound Cleansing - one wound 1 5 []  -  0 Complex Wound Cleansing - multiple wounds []  - 0 Wound Imaging (photographs - any number of wounds) []  - 0 Wound Tracing (instead of photographs) []  - 0 Simple Wound Measurement - one wound []  - 0 Complex Wound Measurement - multiple wounds INTERVENTIONS - Wound Dressings X - Small Wound Dressing one or multiple wounds 1 10 []  - 0 Medium Wound Dressing one or multiple wounds []  - 0 Large Wound Dressing one or multiple wounds []  - 0 Application of Medications - topical []  - 0 Application of Medications - injection INTERVENTIONS - Miscellaneous []  - 0 External ear exam [name], [name] ([id number]) [id number] Page 3 of 4 []  - 0 Specimen Collection (cultures, biopsies, blood, body fluids, etc.) []  - 0 Specimen(s) / Culture(s) sent or taken to Lab for analysis []  - 0 Patient Transfer (multiple staff / Michiel Sites Lift / Similar devices) []  - 0 Simple Staple / Suture removal (25 or less) []  - 0 Complex Staple / Suture removal (26 or more) []  - 0 Hypo / Hyperglycemic Management (close monitor of Blood Glucose) []  - 0 Ankle / Brachial Index (ABI) - do not check if billed separately []  - 0 Vital Signs Has the patient been seen at the hospital within the last three years: Yes Total Score: 60 Level Of Care: New/Established - Level 2 Electronic Signature(s) Signed: 04/08/2023 4:24:49 PM By: [name] Entered By: [name] on 04/08/2023 16:24:12 -------------------------------------------------------------------------------- Encounter Discharge Information Details Patient Name: Date of Service: [name] 04/08/2023 3:45 PM Medical Record Number: [id number] Patient Account Number: [id number] Date of Birth/Sex: Treating RN: [birth year] (88 y.o. [name] Primary Care [name]: [name] Other Clinician: Referring [name]: Treating [name]/Extender: [name] in Treatment: 9 Encounter Discharge Information Items Discharge Condition: Stable Ambulatory Status: Cane Discharge Destination: Home Transportation: Private Auto Accompanied By: spouse Schedule Follow-up Appointment: Yes Clinical Summary of Care: Electronic Signature(s) Signed: 04/08/2023 4:23:51 PM By: [name] Entered By: [name] on 04/08/2023 16:23:51 -------------------------------------------------------------------------------- Wound Assessment Details Patient Name: Date of Service: [name] 04/08/2023 3:45 PM Merri Ray ([id number]) [id number] Page 4 of 4 Medical Record Number: [id number] Patient Account Number: [id number] Date of Birth/Sex: Treating RN: 12/06/1934 (88 y.o. [name] Primary Care Kyiesha Millward: [name] Other Clinician: Referring Alaura Schippers: Treating Kevork Joyce/Extender: [name] in Treatment: 9 Wound Status Wound Number: 1 Primary Skin T ear Etiology: Wound Location: Right, Lateral Lower Leg Wound Open Wounding Event: Skin Tear/Laceration Status: Date Acquired: 10/24/2022 Comorbid Chronic Obstructive Pulmonary Disease (COPD), Arrhythmia, Weeks Of Treatment: 9 History: Congestive Heart Failure, Coronary Artery Disease, Clustered Wound: No Hypertension, Received Radiation Wound Measurements Length: (cm) 2.5 Width: (cm) 1 Depth: (cm) 0.1 Area: (cm) 1.963 Volume: (cm) 0.196 % Reduction in Area: 57.9% % Reduction in Volume: 58% Epithelialization: None Wound Description Classification: Full Thickness Without Exposed Supp Wound Margin: Distinct, outline attached Exudate Amount: Small Exudate Type: Serosanguineous Exudate Color: red, brown ort Structures Foul Odor After Cleansing: No Slough/Fibrino No Wound  Bed Granulation Amount: Medium (34-66%) Exposed Structure Granulation Quality: Pink Fascia Exposed: No Necrotic Amount: None Present (0%) Fat Layer (Subcutaneous Tissue) Exposed: Yes Tendon Exposed: No Muscle Exposed: No Joint Exposed: No Bone Exposed: No Assessment Notes moderate drainage on gauze, mepitel and steri strips left in place, new dressing and tubi grip applied. Treatment Notes Wound #1 (Lower Leg) Wound Laterality: Right, Lateral Cleanser Peri-Wound Care Topical Primary Dressing Secondary Dressing Conforming Guaze Roll-Small Discharge Instruction: Apply Conforming Stretch  Guaze Bandage as directed Gauze Discharge Instruction: As directed: dry, moistened with saline or moistened with Dakins Solution Secured With Tubigrip Size C, 2.75x10 (in/yd) Discharge Instruction: Apply 3 Tubigrip C 3-finger-widths below knee to base of toes to secure dressing and/or for swelling. Compression Wrap Compression Stockings Add-Ons Electronic Signature(s) Signed: 04/08/2023 4:23:07 PM By: [name] Entered By: [name] on 04/08/2023 16:23:07

## 2023-04-09 ENCOUNTER — Ambulatory Visit (INDEPENDENT_AMBULATORY_CARE_PROVIDER_SITE_OTHER): Payer: Medicare Other | Admitting: Internal Medicine

## 2023-04-09 ENCOUNTER — Encounter: Payer: Self-pay | Admitting: Internal Medicine

## 2023-04-09 VITALS — BP 108/60 | HR 95 | Ht 60.0 in | Wt 108.0 lb

## 2023-04-09 DIAGNOSIS — I4819 Other persistent atrial fibrillation: Secondary | ICD-10-CM

## 2023-04-09 DIAGNOSIS — E782 Mixed hyperlipidemia: Secondary | ICD-10-CM | POA: Diagnosis not present

## 2023-04-09 DIAGNOSIS — F5101 Primary insomnia: Secondary | ICD-10-CM

## 2023-04-09 DIAGNOSIS — N1831 Chronic kidney disease, stage 3a: Secondary | ICD-10-CM | POA: Diagnosis not present

## 2023-04-09 DIAGNOSIS — M810 Age-related osteoporosis without current pathological fracture: Secondary | ICD-10-CM

## 2023-04-09 DIAGNOSIS — E039 Hypothyroidism, unspecified: Secondary | ICD-10-CM

## 2023-04-09 DIAGNOSIS — J449 Chronic obstructive pulmonary disease, unspecified: Secondary | ICD-10-CM

## 2023-04-09 DIAGNOSIS — I10 Essential (primary) hypertension: Secondary | ICD-10-CM

## 2023-04-09 DIAGNOSIS — K219 Gastro-esophageal reflux disease without esophagitis: Secondary | ICD-10-CM

## 2023-04-09 DIAGNOSIS — D6869 Other thrombophilia: Secondary | ICD-10-CM

## 2023-04-09 NOTE — Progress Notes (Signed)
Date:  04/09/2023   Name:  [name]   DOB:  Jul 26, 1935   MRN:  [id number]   Chief Complaint: Annual Exam (Declined breast exam.) [name] is a 88 y.o. female who presents today for her Complete Annual Exam. She feels well. She reports exercising- some. She reports she is sleeping well. Breast complaints - none.  Mammogram: 09/2021 aged out DEXA: 10/2020 osteoporosis Colonoscopy: 01/2018 EGD: 01/2018  Health Maintenance Due  Topic Date Due   COVID-19 Vaccine (6 - 2023-24 season) 09/30/2022    Immunization History  Administered Date(s) Administered   Fluad Quad(high Dose 65+) 05/22/2021, 08/05/2022   Influenza, High Dose Seasonal PF 06/27/2015, 06/12/2016, 06/10/2017, 04/30/2018, 06/23/2020   Influenza, Seasonal, Injecte, Preservative Fre 06/21/2015   Influenza-Unspecified 06/20/2012, 06/16/2013, 06/28/2014   PFIZER Comirnaty(Gray Top)Covid-19 Tri-Sucrose Vaccine 05/23/2020   PFIZER(Purple Top)SARS-COV-2 Vaccination 09/16/2019, 10/07/2019   PNEUMOCOCCAL CONJUGATE-20 08/06/2022   Pfizer Covid-19 Vaccine Bivalent Booster 75yrs & up 07/05/2021   Pneumococcal Conjugate-13 07/22/2016   Pneumococcal Polysaccharide-23 09/09/1992, 09/25/2005, 09/19/2014   Respiratory Syncytial Virus Vaccine,Recomb Aduvanted(Arexvy) 08/06/2022   Tdap 07/18/2017   Unspecified SARS-COV-2 Vaccination 08/05/2022   Zoster Recombinant(Shingrix) 04/02/2018, 07/14/2018    Thyroid Problem Presents for follow-up visit. Patient reports no anxiety, constipation, diarrhea, fatigue, palpitations or tremors. The symptoms have been stable. Her past medical history is significant for hyperlipidemia.  Gastroesophageal Reflux She complains of heartburn. She reports no abdominal pain, no chest pain, no coughing or no wheezing. This is a recurrent problem. The problem occurs occasionally. Pertinent negatives include no fatigue. She has tried a PPI for the symptoms. Past procedures include an EGD (2019 - HH, no ulcer).   Hypertension This is a chronic problem. Associated symptoms include shortness of breath. Pertinent negatives include no chest pain, headaches or palpitations. Identifiable causes of hypertension include a thyroid problem.  Hyperlipidemia This is a chronic problem. The problem is controlled. Associated symptoms include shortness of breath. Pertinent negatives include no chest pain. Current antihyperlipidemic treatment includes statins.  COPD - stable on O2 and duonebs.    Lab Results  Component Value Date   NA 142 08/21/2022   K 3.8 08/21/2022   CO2 33 (H) 08/21/2022   GLUCOSE 100 (H) 08/21/2022   BUN 21 08/21/2022   CREATININE 1.07 (H) 08/21/2022   CALCIUM 9.3 08/21/2022   EGFR 51 (L) 08/21/2022   GFRNONAA 55 (L) 03/09/2021   Lab Results  Component Value Date   CHOL 153 08/21/2021   HDL 46 08/21/2021   LDLCALC 92 08/21/2021   TRIG 80 08/21/2021   CHOLHDL 3.3 08/21/2021   Lab Results  Component Value Date   TSH 0.958 08/21/2022   Lab Results  Component Value Date   HGBA1C 5.4 08/21/2022   Lab Results  Component Value Date   WBC 6.7 08/21/2022   HGB 11.8 08/21/2022   HCT 35.3 08/21/2022   MCV 100 (H) 08/21/2022   PLT 141 (L) 08/21/2022   Lab Results  Component Value Date   ALT 19 08/21/2022   AST 18 08/21/2022   ALKPHOS 70 08/21/2022   BILITOT 0.3 08/21/2022   No results found for: "25OHVITD2", "25OHVITD3", "VD25OH"   Review of Systems  Constitutional:  Negative for chills, fatigue and fever.  HENT:  Positive for hearing loss. Negative for congestion, tinnitus, trouble swallowing and voice change.   Eyes:  Negative for visual disturbance.  Respiratory:  Positive for shortness of breath. Negative for cough, chest tightness and wheezing.   Cardiovascular:  Negative for chest pain, palpitations and leg swelling.  Gastrointestinal:  Positive for heartburn. Negative for abdominal pain, constipation and diarrhea.  Endocrine: Negative for polydipsia and polyuria.   Genitourinary:  Negative for dysuria, frequency and genital sores.  Musculoskeletal:  Positive for arthralgias and gait problem (uses cane). Negative for joint swelling.  Skin:  Positive for wound (right lower leg healing after skin graft). Negative for color change and rash.  Neurological:  Negative for dizziness, tremors, light-headedness and headaches.  Hematological:  Negative for adenopathy. Does not bruise/bleed easily.  Psychiatric/Behavioral:  Positive for sleep disturbance. Negative for dysphoric mood. The patient is not nervous/anxious.     Patient Active Problem List   Diagnosis Date Noted   Non-healing ulcer of lower leg, right, limited to breakdown of skin (HCC) 01/24/2023   Macular degeneration 12/10/2022   Chronic respiratory failure with hypoxia (HCC) 04/08/2022   Gastroesophageal reflux disease 02/19/2022   Sciatica of left side 08/21/2021   Aortoiliac occlusive disease (HCC) 05/22/2021   Cochlear implant in place 05/22/2021   Acquired thrombophilia (HCC) 08/11/2020   Stage 3a chronic kidney disease (CKD) (HCC) 08/11/2020   Primary insomnia 07/31/2019   Osteoporosis, postmenopausal 01/23/2018   Coronary artery disease involving native coronary artery of native heart with angina pectoris (HCC) 01/13/2017   S/P placement of cardiac pacemaker 05/02/15, medtronic 05/03/2015   Bradycardia 05/03/2015   Presence of cardiac pacemaker 05/03/2015   S/P mitral valve replacement with bioprosthetic valve, tricuspid valve repair, maze procedure and CABG x1 04/25/2015   S/P tricuspid valve repair 04/25/2015   S/P Maze operation for atrial fibrillation 04/25/2015   S/P CABG x 1 04/25/2015   Pulmonary hypertension (HCC)    Tricuspid regurgitation    Chronic combined systolic and diastolic CHF (congestive heart failure) (HCC)    Chronic systolic heart failure (HCC) 11/10/2014   Essential hypertension 11/10/2014   Mitral regurgitation 11/10/2014   Hyperlipidemia 11/10/2014   Atrial  fibrillation (HCC) 11/10/2014   Prediabetes 05/08/2014   Auditory vertigo 05/08/2014   Adult hypothyroidism 05/08/2014   H/O adenomatous polyp of colon 05/08/2014   Stage 4 very severe COPD by GOLD classification (HCC) 03/28/2014    Allergies  Allergen Reactions   Ace Inhibitors Cough and Other (See Comments)   Amoxicillin-Pot Clavulanate Swelling and Other (See Comments)    SWOLLEN JOINTS  Has patient had a PCN reaction causing immediate rash, facial/tongue/throat swelling, SOB or lightheadedness with hypotension: Yes Has patient had a PCN reaction causing severe rash involving mucus membranes or skin necrosis: No Has patient had a PCN reaction that required hospitalization No Has patient had a PCN reaction occurring within the last 10 years: No If all of the above answers are "NO", then may proceed with Cephalosporin   Penicillins Other (See Comments) and Swelling    SWOLLEN JOINTS   Has patient had a PCN reaction causing immediate rash, facial/tongue/throat swelling, SOB or lightheadedness with hypotension: Yes  Has patient had a PCN reaction causing severe rash involving mucus membranes or skin necrosis: No  Has patient had a PCN reaction that required hospitalization No  Has patient had a PCN reaction occurring within the last 10 years: No  If all of the above answers are "NO", then may proceed with Cephalosporin use.  Swollen joints  Swollen joints, Has patient had a PCN reaction causing immediate rash, facial/tongue/throat swelling, SOB or lightheadedness with hypotension: Yes, Has patient had a PCN reaction causing severe rash involving mucus membranes or skin necrosis: No, Has patient  had a PCN reaction that required hospitalization No, Has patient had a PCN reaction occurring within the last 10 years: No, If all of the above answers are "NO", then may proceed with Cephalosporin use.   Rosuvastatin Other (See Comments)   Nifedipine Other (See Comments)    UNSPECIFIED    Propranolol Other (See Comments)    UNSPECIFIED   Diazepam Other (See Comments)    Made her "hyper"    Meperidine Other (See Comments)    Made her "hyper"     Propoxyphene Other (See Comments)    Made her "hyper"      Past Surgical History:  Procedure Laterality Date   APPENDECTOMY     APPLICATION OF A-CELL OF CHEST/ABDOMEN N/A 11/21/2015   Procedure: APPLICATION OF A-CELL OF CHEST/ABDOMEN;  Surgeon: Alena Bills Dillingham, DO;  [facility];  Service: Plastics;  Laterality: N/A;   APPLICATION OF A-CELL OF CHEST/ABDOMEN Right 12/21/2015   Procedure: APPLICATION OF A-CELL OF RIGHT CHEST;  Surgeon: Alena Bills Dillingham, DO;  Location: [facility];  Service: Plastics;  Laterality: Right;   APPLICATION OF A-CELL OF CHEST/ABDOMEN Right 01/11/2016   Procedure: APPLICATION OF A-CELL OF RIGHT CHEST;  Surgeon: Alena Bills Dillingham, DO;  Location: [facility];  Service: Plastics;  Laterality: Right;   APPLICATION OF A-CELL OF CHEST/ABDOMEN Right 02/12/2016   Procedure: APPLICATION OF A-CELL TO RIGHT CHEST WOUND;  Surgeon: Alena Bills Dillingham, DO;  [facility]: [facility];  Service: Plastics;  Laterality: Right;   APPLICATION OF WOUND VAC N/A 05/09/2015   Procedure: APPLICATION OF WOUND VAC;  Surgeon: Purcell Nails, MD;  [facility];  Service: Thoracic;  Laterality: N/A;   APPLICATION OF WOUND VAC N/A 11/13/2015   Procedure: APPLICATION OF WOUND VAC;  Surgeon: Purcell Nails, MD;  [facility];  Service: Thoracic;  Laterality: N/A;   APPLICATION OF WOUND VAC N/A 11/21/2015   Procedure: APPLICATION OF WOUND VAC;  Surgeon: Alena Bills Dillingham, DO;  [facility];  Service: Plastics;  Laterality: N/A;   APPLICATION OF WOUND VAC Right 12/21/2015   Procedure: APPLICATION OF WOUND VAC to right chest wall ;  Surgeon: Alena Bills Dillingham, DO;  [facility];  Service: Plastics;  Laterality: Right;   APPLICATION OF WOUND VAC Right 01/11/2016   Procedure: APPLICATION OF WOUND VAC RIGHT CHEST ;  Surgeon: Alena Bills  Dillingham, DO;  [facility];  Service: Plastics;  Laterality: Right;   APPLICATION OF WOUND VAC Right 01/24/2016   Procedure: APPLICATION OF WOUND VAC RIGHT CHEST WALL;  Surgeon: Alena Bills Dillingham, DO;  [facility];  Service: Plastics;  Laterality: Right;   APPLICATION OF WOUND VAC Right 02/12/2016   Procedure: RE-APPLICATION OF WOUND VAC TO RIGHT CHEST WOUND;  Surgeon: Alena Bills Dillingham, DO;  [facility];  Service: Plastics;  Laterality: Right;   BIV UPGRADE N/A 06/07/2019   Procedure: BIV UPGRADE;  Surgeon: Duke Salvia, MD;  Location: [facility] INVASIVE CV LAB;  Service: Cardiovascular;  Laterality: N/A;   BREAST BIOPSY Left 11/25/06   neg   BREAST BIOPSY Left 01/20/12   /clip-neg   CARDIAC CATHETERIZATION  11/2013   [facility]   CARDIAC CATHETERIZATION  10/2014   [facility]   CARDIAC VALVE REPLACEMENT     CARDIOVERSION N/A 07/20/2018   Procedure: CARDIOVERSION;  Surgeon: Iran Ouch, MD;  Location: [facility] ORS;  Service: Cardiovascular;  Laterality: N/A;   CARDIOVERSION N/A 09/18/2020   Procedure: CARDIOVERSION;  Surgeon: Antonieta Iba, MD;  Location: [facility] ORS;  Service: Cardiovascular;  Laterality: N/A;   CATARACT EXTRACTION W/ INTRAOCULAR LENS  IMPLANT, BILATERAL Bilateral 2014   CLIPPING OF ATRIAL APPENDAGE N/A 04/25/2015   Procedure: CLIPPING OF ATRIAL APPENDAGE;  Surgeon: Purcell Nails, MD;  [facility];  Service: Open Heart Surgery;  Laterality: N/A;   COCHLEAR IMPLANT Left 2005?   COLONOSCOPY WITH PROPOFOL N/A 01/20/2018   Procedure: COLONOSCOPY WITH PROPOFOL;  Surgeon: Toledo, Boykin Nearing, MD;  Location: [facility] ENDOSCOPY;  Service: Gastroenterology;  Laterality: N/A;   CORONARY ANGIOPLASTY     CORONARY ARTERY BYPASS GRAFT N/A 04/25/2015   Procedure: CORONARY ARTERY BYPASS GRAFTING (CABG) x ONE, using left internal mammary artery;  Surgeon: Purcell Nails, MD;  [facility];  Service: Open Heart Surgery;  Laterality: N/A;   DILATION AND CURETTAGE OF UTERUS  "several before  hysterectomy"   EP IMPLANTABLE DEVICE N/A 05/02/2015   Procedure: Pacemaker Implant;  Surgeon: Hillis Range, MD;  [facility] INVAS[facility] CV LAB;  Service: Cardiovascular;  Laterality: N/A;   ESOPHAGOGASTRODUODENOSCOPY (EGD) WITH PROPOFOL N/A 01/20/2018   Procedure: ESOPHAGOGASTRODUODENOSCOPY (EGD) WITH PROPOFOL;  Surgeon: Toledo, Boykin Nearing, MD;  Location: [facility] ENDOSCOPY;  Service: Gastroenterology;  Laterality: N/A;   EYE SURGERY     I & D EXTREMITY Right 12/06/2015   Procedure: IRRIGATION AND DEBRIDEMENT RIGHT CHEST WALL WITH ACELL PLACEMENT AND VAC;  Surgeon: Alena Bills Dillingham, DO;  [facility];  Service: Plastics;  Laterality: Right;   INCISION AND DRAINAGE OF WOUND N/A 11/21/2015   Procedure: IRRIGATION AND DEBRIDEMENT WOUND;  Surgeon: Alena Bills Dillingham, DO;  [facility];  Service: Plastics;  Laterality: N/A;   INCISION AND DRAINAGE OF WOUND Right 12/21/2015   Procedure: IRRIGATION AND DEBRIDEMENT right chest wall WOUND;  Surgeon: Alena Bills Dillingham, DO;  [facility]: [facility];  Service: Plastics;  Laterality: Right;  right chest wall    INCISION AND DRAINAGE OF WOUND Right 01/24/2016   Procedure: IRRIGATION AND DEBRIDEMENT RIGHT CHEST WALL WOUND;  Surgeon: Alena Bills Dillingham, DO;  [facility];  Service: Plastics;  Laterality: Right;   INCISION AND DRAINAGE OF WOUND Right 03/28/2016   Procedure: IRRIGATION AND DEBRIDEMENT RIGHT CHEST WALL WOUND WITH A Cell Placement;  Surgeon: Alena Bills Dillingham, DO;  [facility];  Service: Plastics;  Laterality: Right;   INCISION AND DRAINAGE OF WOUND Right 05/17/2016   Procedure: IRRIGATION AND DEBRIDEMENT OF RIGHT CHEST WOUND WITH A CELL PLACEMENT;  Surgeon: Alena Bills Dillingham, DO;  [facility];  Service: Plastics;  Laterality: Right;   INSERT / REPLACE / REMOVE PACEMAKER     IRRIGATION AND DEBRIDEMENT OF WOUND WITH SPLIT THICKNESS SKIN GRAFT Right 01/11/2016   Procedure: IRRIGATION AND DEBRIDEMENT OF RIGHT CHEST WOUND ;  Surgeon: Alena Bills  Dillingham, DO;  [facility];  Service: Plastics;  Laterality: Right;   MASTECTOMY, PARTIAL Right 2002   positive, partial   MAZE N/A 04/25/2015   Procedure: MAZE;  Surgeon: Purcell Nails, MD;  Location[facility] OR;  Service: Open Heart Surgery;  Laterality: N/A;   MITRAL VALVE REPAIR N/A 04/25/2015   Procedure: MITRAL VALVE  REPLACEMENT using a 27 mm Edwards Perimount Magna Mitral Ease Valve;  Surgeon: Purcell Nails, MD;  [facility];  Service: Open Heart Surgery;  Laterality: N/A;   RIGHT HEART CATH N/A 02/08/2019   Procedure: RIGHT HEART CATH;  Surgeon: Iran Ouch, MD;  Location: [facility] INVASIVE CV LAB;  Service: Cardiovascular;  Laterality: N/A;  SKIN SPLIT GRAFT Right 02/12/2016   Procedure: IRRIGATION AND DEBRIDEMENT RIGHT CHEST WOUND;  Surgeon: Alena Bills Dillingham, DO;  [facility];  Service: Plastics;  Laterality: Right;   STERNAL WIRES REMOVAL N/A 06/05/2015   Procedure: STERNAL WIRES REMOVAL;  Surgeon: Purcell Nails, MD;  [facility];  Service: Thoracic;  Laterality: N/A;   STERNAL WOUND DEBRIDEMENT N/A 05/09/2015   Procedure: STERNAL WOUND DEBRIDEMENT;  Surgeon: Purcell Nails, MD;  [facility];  Service: Thoracic;  Laterality: N/A;   STERNAL WOUND DEBRIDEMENT N/A 11/13/2015   Procedure: Excisional drainage of RIGHT Chest wall mass and breast mass ;  Surgeon: Purcell Nails, MD;  [facility];  Service: Thoracic;  Laterality: N/A;   TEE WITHOUT CARDIOVERSION N/A 04/25/2015   Procedure: TRANSESOPHAGEAL ECHOCARDIOGRAM (TEE);  Surgeon: Purcell Nails, MD;  Location: [facility] OR;  Service: Open Heart Surgery;  Laterality: N/A;   TONSILLECTOMY     TRAM Right 11/18/2015   Procedure: VRAM Vertical Rectus Abdominus Muscle Flap;  Surgeon: Alena Bills Dillingham, DO;  [facility];  Service: Plastics;  Laterality: Right;  RIght Back   TRICUSPID VALVE REPLACEMENT N/A 04/25/2015   Procedure: TRICUSPID VALVE REPAIR;  Surgeon: Purcell Nails, MD;  [facility];  Service: Open  Heart Surgery;  Laterality: N/A;   VAGINAL HYSTERECTOMY      Social History   Tobacco Use   Smoking status: Former    Current packs/day: 0.00    Average packs/day: 1.5 packs/day for 40.0 years (60.0 ttl pk-yrs)    Types: Cigarettes    Start date: 04/20/1958    Quit date: 04/20/1998    Years since quitting: 24.9   Smokeless tobacco: Never  Vaping Use   Vaping status: Never Used  Substance Use Topics   Alcohol use: Not Currently    Comment: 11/10/2015 "glass of wine a few times/year, if that"   Drug use: No     Medication list has been reviewed and updated.  Current Meds  Medication Sig   acetaminophen (TYLENOL) 500 MG tablet Take 1,000 mg by mouth every 6 (six) hours as needed (for pain.).   amiodarone (PACERONE) 200 MG tablet Take 1 tablet (200 mg) 5 days per week   bisoprolol (ZEBETA) 5 MG tablet Take 0.5 tablets (2.5 mg total) by mouth daily.   Cyanocobalamin (VITAMIN B-12 PO) Take 1,000 mcg by mouth daily.   diphenhydrAMINE (BENADRYL) 25 mg capsule Take 25 mg by mouth every 6 (six) hours as needed for allergies.   docusate sodium (COLACE) 100 MG capsule Take 100 mg by mouth 2 (two) times daily as needed (constipation.).   Ferrous Sulfate (IRON PO) Take 1 tablet by mouth daily.   furosemide (LASIX) 20 MG tablet TAKE 2 TABLETS (40 MG) IN THE MORNING AND 1 TABLET (20) MG IN THE EVENING.   gabapentin (NEURONTIN) 100 MG capsule Take 2 capsules (200 mg total) by mouth at bedtime.   ipratropium-albuterol (DUONEB) 0.5-2.5 (3) MG/3ML SOLN Inhale 3 mLs into the lungs every 6 (six) hours as needed.   levothyroxine (SYNTHROID) 88 MCG tablet TAKE 1 TABLET BY MOUTH EVERY DAY BEFORE BREAKFAST   Multiple Vitamins-Minerals (PRESERVISION AREDS 2 PO) Take 1 tablet by mouth 2 (two) times daily.    mupirocin ointment (BACTROBAN) 2 % APPLY 1 APPLICATION TOPICALLY 2 (TWO) TIMES DAILY. TO SKIN WOUND   OXYGEN Inhale 2 L into the lungs continuous.   pantoprazole (PROTONIX) 40 MG tablet TAKE 1 TABLET  BY MOUTH TWICE  A DAY   sacubitril-valsartan (ENTRESTO) 49-51 MG Take 1 tablet (49/51 mg) by mouth twice daily   simvastatin (ZOCOR) 10 MG tablet TAKE 1 TABLET BY MOUTH EVERYDAY AT BEDTIME   spironolactone (ALDACTONE) 25 MG tablet TAKE 1 TABLET BY MOUTH EVERY DAY   warfarin (COUMADIN) 2.5 MG tablet TAKE 1/2 A TABLET TO 1 TABLET BY MOUTH DAILY AS DIRECTED BY THE COUMADIN CLINIC.       04/09/2023    8:31 AM 01/24/2023    3:33 PM 12/10/2022   11:07 AM 11/15/2022    8:02 AM  GAD 7 : Generalized Anxiety Score  Nervous, Anxious, on Edge 0 0 0 0  Control/stop worrying 0 0 0 0  Worry too much - different things 0 0 0 0  Trouble relaxing 0 0 0 0  Restless 0 0 0 0  Easily annoyed or irritable 0 0 0 0  Afraid - awful might happen 0 0 0 0  Total GAD 7 Score 0 0 0 0  Anxiety Difficulty Not difficult at all Not difficult at all Not difficult at all Not difficult at all       04/09/2023    8:31 AM 01/24/2023    3:33 PM 12/10/2022   11:07 AM  Depression screen PHQ 2/9  Decreased Interest 0 0 0  Down, Depressed, Hopeless 0 0 0  PHQ - 2 Score 0 0 0  Altered sleeping 0 0 0  Tired, decreased energy 0 0 0  Change in appetite 0 0 0  Feeling bad or failure about yourself  0 0 0  Trouble concentrating 0 0 0  Moving slowly or fidgety/restless 0 0 0  Suicidal thoughts 0 0 0  PHQ-9 Score 0 0 0  Difficult doing work/chores Not difficult at all Not difficult at all Not difficult at all    BP Readings from Last 3 Encounters:  04/09/23 108/60  02/25/23 112/60  01/24/23 110/78    Physical Exam Vitals and nursing note reviewed.  Constitutional:      General: She is not in acute distress.    Appearance: She is well-developed.  HENT:     Head: Normocephalic and atraumatic.     Right Ear: Decreased hearing noted.     Left Ear: Decreased hearing noted.     Ears:     Comments: Left cochlear implant in place    Nose:     Right Sinus: No maxillary sinus tenderness.     Left Sinus: No maxillary sinus  tenderness.  Eyes:     General: No scleral icterus.       Right eye: No discharge.        Left eye: No discharge.     Conjunctiva/sclera: Conjunctivae normal.  Neck:     Thyroid: No thyromegaly.     Vascular: No carotid bruit.  Cardiovascular:     Rate and Rhythm: Normal rate and regular rhythm.     Pulses: Normal pulses.     Heart sounds: Normal heart sounds.  Pulmonary:     Effort: Pulmonary effort is normal. No respiratory distress.     Breath sounds: Decreased air movement present. Decreased breath sounds present. No wheezing.  Chest:     Comments: Breast exam declined Abdominal:     General: Bowel sounds are normal.     Palpations: Abdomen is soft.     Tenderness: There is no abdominal tenderness.  Musculoskeletal:     Cervical back: Normal range of motion. No erythema.  Right lower leg: No edema.     Left lower leg: No edema.  Lymphadenopathy:     Cervical: No cervical adenopathy.  Skin:    General: Skin is warm and dry.     Findings: No rash.  Neurological:     Mental Status: She is alert and oriented to person, place, and time.     Cranial Nerves: No cranial nerve deficit.     Sensory: No sensory deficit.     Motor: Motor function is intact.     Deep Tendon Reflexes: Reflexes are normal and symmetric.  Psychiatric:        Attention and Perception: Attention normal.        Mood and Affect: Mood normal.        Speech: Speech normal.     Wt Readings from Last 3 Encounters:  04/09/23 108 lb (49 kg)  02/25/23 108 lb (49 kg)  01/24/23 111 lb (50.3 kg)    BP 108/60   Pulse 95   Ht 5' (1.524 m)   Wt 108 lb (49 kg)   SpO2 90%   BMI 21.09 kg/m   Assessment and Plan:  Problem List Items Addressed This Visit       Unprioritized   Stage 4 very severe COPD by GOLD classification (HCC) (Chronic)    Stable chronic shortness of breath; benefiting from O2  and duonebs. Immunizations are up to date      Stage 3a chronic kidney disease (CKD) (HCC)  (Chronic)    GFR stable; avoiding nsaids       Relevant Orders   Comprehensive metabolic panel   Primary insomnia    Mild chronic insomnia Take gabapentin nightly with benefit and no side effects      Hyperlipidemia    LDL is  Lab Results  Component Value Date   LDLCALC 92 08/21/2021   Currently being treated with simvastatin with good compliance and no concerns.       Relevant Orders   Lipid panel   Gastroesophageal reflux disease (Chronic)    Reflux symptoms are minimal on current therapy - pantoprazole. No red flag signs such as weight loss, n/v, melena       Relevant Orders   CBC with Differential/Platelet   Essential hypertension (Chronic)    Normal exam with stable BP on entresto, spironolacton, lasix and Zebeta. CHF followed closely by Cardiology No concerns or side effects to current medication. No change in regimen; continue low sodium diet.       Relevant Orders   Comprehensive metabolic panel   Atrial fibrillation (HCC) (Chronic)    Rate controlled - on amiodarone and Zebeta on Warfarin for anticoagulation      Adult hypothyroidism - Primary (Chronic)    Supplemented Lab Results  Component Value Date   TSH 0.958 08/21/2022         Relevant Orders   TSH + free T4   Acquired thrombophilia (HCC) (Chronic)    On Warfarin for chronic Afib Monitored by the coumadin clinic      Other Visit Diagnoses     Age related osteoporosis, unspecified pathological fracture presence       Relevant Orders   VITAMIN D 25 Hydroxy (Vit-D Deficiency, Fractures)       Return in about 6 months (around 10/10/2023) for HTN, COPD, thyroid.    Reubin Milan, MD [facility] [place]

## 2023-04-09 NOTE — Assessment & Plan Note (Signed)
Reflux symptoms are minimal on current therapy - pantoprazole. No red flag signs such as weight loss, n/v, melena  

## 2023-04-09 NOTE — Assessment & Plan Note (Signed)
Supplemented Lab Results  Component Value Date   TSH 0.958 08/21/2022

## 2023-04-09 NOTE — Assessment & Plan Note (Signed)
GFR stable; avoiding nsaids

## 2023-04-09 NOTE — Progress Notes (Signed)
[name] ([id number]) [id number] Page 1 of 2 Visit Report for 04/08/2023 Physician Orders Details Patient Name: Date of Service: [name], [name] 04/08/2023 3:45 PM Medical Record Number: [id number] Patient Account Number: [id number] Date of Birth/Sex: Treating RN: 08/30/1935 (88 y.o. [name] Primary Care Provider: Bari Edward Other Clinician: Referring Provider: Treating Provider/Extender: Francee Piccolo in Treatment: 9 Verbal / Phone Orders: No Diagnosis Coding Follow-up Appointments Return Appointment in 2 weeks. ppointment in: - nurse visit in one week for dressing check Return A Bathing/ Shower/ Hygiene Clean wound with Normal Saline or wound cleanser. May shower; gently cleanse wound with antibacterial soap, rinse and pat dry prior to dressing wounds No tub bath. Anesthetic (Use 'Patient Medications' Section for Anesthetic Order Entry) Lidocaine applied to wound bed Cellular or Tissue Based Products Cellular or Tissue Based Product Type: - apligraf applied #4; order apligraf for application in two weeks Cellular or Tissue Based Product applied to wound bed; including contact layer, fixation with steri-strips, dry gauze and cover dressing. (DO NOT REMOVE). - steri strips, mepitel, gauze, roll guaze and tubi grip size c. Edema Control - Lymphedema / Segmental Compressive Device / Other Elevate, Exercise Daily and A void Standing for Long Periods of Time. Elevate leg(s) parallel to the floor when sitting. DO YOUR BEST to sleep in the bed at night. DO NOT sleep in your recliner. Long hours of sitting in a recliner leads to swelling of the legs and/or potential wounds on your backside. Wound Treatment Wound #1 - Lower Leg Wound Laterality: Right, Lateral Secondary Dressing: Conforming Guaze Roll-Small Other:every 2 weeks/30 Days Discharge Instructions: Apply Conforming Stretch Guaze Bandage as directed Secondary  Dressing: Gauze Other:every 2 weeks/30 Days Discharge Instructions: As directed: dry, moistened with saline or moistened with Dakins Solution Secured With: Tubigrip Size C, 2.75x10 (in/yd) Other:every 2 weeks/30 Days Discharge Instructions: Apply 3 Tubigrip C 3-finger-widths below knee to base of toes to secure dressing and/or for swelling. Electronic Signature(s) Signed: 04/08/2023 4:23:31 PM By: Angelina Pih Signed: 04/08/2023 4:58:57 PM By: Allen Derry PA-C Entered By: Angelina Pih on 04/08/2023 16:23:31 [name] ([id number]) [id number] Page 2 of 2 -------------------------------------------------------------------------------- SuperBill Details Patient Name: Date of Service: [name], [name] 04/08/2023 Medical Record Number: [id number] Patient Account Number: [id number] Date of Birth/Sex: Treating RN: 05-Aug-1935 (88 y.o. [name] Primary Care Provider: Bari Edward Other Clinician: Referring Provider: Treating Provider/Extender: Francee Piccolo in Treatment: 9 Diagnosis Coding ICD-10 Codes Code Description [phone] Laceration without foreign body, right lower leg, initial encounter I87.331 Chronic venous hypertension (idiopathic) with ulcer and inflammation of right lower extremity L97.812 Non-pressure chronic ulcer of other part of right lower leg with fat layer exposed I48.0 Paroxysmal atrial fibrillation Z79.01 Long term (current) use of anticoagulants J44.9 Chronic obstructive pulmonary disease, unspecified I10 Essential (primary) hypertension 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

## 2023-04-09 NOTE — Assessment & Plan Note (Addendum)
Rate controlled - on amiodarone and Zebeta on Warfarin for anticoagulation

## 2023-04-09 NOTE — Assessment & Plan Note (Addendum)
On Warfarin for chronic Afib Monitored by the coumadin clinic

## 2023-04-09 NOTE — Assessment & Plan Note (Signed)
Normal exam with stable BP on entresto, spironolacton, lasix and Zebeta. CHF followed closely by Cardiology No concerns or side effects to current medication. No change in regimen; continue low sodium diet.

## 2023-04-09 NOTE — Assessment & Plan Note (Signed)
Mild chronic insomnia Take gabapentin nightly with benefit and no side effects

## 2023-04-09 NOTE — Assessment & Plan Note (Signed)
Stable chronic shortness of breath; benefiting from O2 [place]vlin and duonebs. Immunizations are up to date

## 2023-04-09 NOTE — Assessment & Plan Note (Signed)
LDL is  Lab Results  Component Value Date   LDLCALC 92 08/21/2021   Currently being treated with simvastatin with good compliance and no concerns.

## 2023-04-11 NOTE — Telephone Encounter (Signed)
Patient is scheduled for 10/1

## 2023-04-15 ENCOUNTER — Encounter: Payer: Medicare Other | Attending: Physician Assistant | Admitting: Physician Assistant

## 2023-04-15 DIAGNOSIS — Z7901 Long term (current) use of anticoagulants: Secondary | ICD-10-CM | POA: Insufficient documentation

## 2023-04-15 DIAGNOSIS — I87331 Chronic venous hypertension (idiopathic) with ulcer and inflammation of right lower extremity: Secondary | ICD-10-CM | POA: Insufficient documentation

## 2023-04-15 DIAGNOSIS — I110 Hypertensive heart disease with heart failure: Secondary | ICD-10-CM | POA: Diagnosis not present

## 2023-04-15 DIAGNOSIS — I2510 Atherosclerotic heart disease of native coronary artery without angina pectoris: Secondary | ICD-10-CM | POA: Diagnosis not present

## 2023-04-15 DIAGNOSIS — J449 Chronic obstructive pulmonary disease, unspecified: Secondary | ICD-10-CM | POA: Insufficient documentation

## 2023-04-15 DIAGNOSIS — L97812 Non-pressure chronic ulcer of other part of right lower leg with fat layer exposed: Secondary | ICD-10-CM | POA: Diagnosis not present

## 2023-04-15 DIAGNOSIS — I480 Paroxysmal atrial fibrillation: Secondary | ICD-10-CM | POA: Diagnosis not present

## 2023-04-15 DIAGNOSIS — I5042 Chronic combined systolic (congestive) and diastolic (congestive) heart failure: Secondary | ICD-10-CM | POA: Diagnosis not present

## 2023-04-15 NOTE — Progress Notes (Signed)
[name], [name] ([id number]) [id number] Page 1 of 8 Visit Report for 04/15/2023 Chief Complaint Document Details Patient Name: Date of Service: [name], [name] 04/15/2023 3:00 PM Medical Record Number: [id number] Patient Account Number: [id number] Date of Birth/Sex: Treating RN: 04-07-1935 (88 y.o. [name] Primary Care Provider: Bari [name] Other Clinician: Referring Provider: Treating Provider/Extender: [name] in Treatment: 10 Information Obtained from: Patient Chief Complaint Right LE Ulcer Electronic Signature(s) Signed: 04/15/2023 3:03:20 PM By: Jamie Derry PA-C Entered By: [name] on 04/15/2023 15:03:19 -------------------------------------------------------------------------------- Cellular or Tissue Based Product Details Patient Name: Date of Service: [name] 04/15/2023 3:00 PM Medical Record Number: [id number] Patient Account Number: [id number] Date of Birth/Sex: Treating RN: [birth year] (88 y.o. [name] Primary Care Provider: Bari [name] Other Clinician: Referring Provider: Treating Provider/Extender: [name] in Treatment: 10 Cellular or Tissue Based Product Type Wound #1 Right,Lateral Lower Leg Applied to: Performed By: Physician Jamie Derry, PA-C Cellular or Tissue Based Product Type: Apligraf Level of Consciousness (Pre-procedure): Awake and Alert Pre-procedure Verification/Time Out Yes - 15:43 Taken: Location: trunk / arms / legs Wound Size (sq cm): 1 Product Size (sq cm): 44 Waste Size (sq cm): 11 Waste Reason: wound size Amount of Product Applied (sq cm): 33 Instrument Used: Blade, Forceps, Scissors Lot #: [id number] Expiration Date: 04/18/2023 Fenestrated: Yes InstrumentANNASTON, GALLEGO [name] ([id number]) [id number] Page 2 of 8 Reconstituted: Yes Solution Type: saline Solution Amount: 10 Lot #:  [id number] Solution Expiration Date: 09/30/2024 Secured: Yes Secured With: Steri-Strips Dressing Applied: Yes Primary Dressing: mepitel Procedural Pain: 0 Post Procedural Pain: 0 Response to Treatment: Procedure was tolerated well Level of Consciousness (Post- Awake and Alert procedure): Post Procedure Diagnosis Same as Pre-procedure Electronic Signature(s) Signed: 04/15/2023 4:18:36 PM By: [name] Entered By: [name] on 04/15/2023 15:44:22 -------------------------------------------------------------------------------- HPI Details Patient Name: Date of Service: [name] 04/15/2023 3:00 PM Medical Record Number: [id number] Patient Account Number: [id number] Date of Birth/Sex: Treating RN: [birth year] (88 y.o. [name] Primary Care Provider: Bari [name] Other Clinician: Referring Provider: Treating Provider/Extender: [name] in Treatment: 10 History of Present Illness HPI Description: 02-04-2023 patient presents today for initial evaluation here in our clinic concerning a wound which actually began around October 24, 2022. With that being said she notes that this was due to an injury and then has continued to be a problem not wanting to close and heal as quickly as would be expected. She is on long-term anticoagulant therapy due to atrial fibrillation. She also has hypertension, COPD, coronary artery disease, congestive heart failure, and chronic venous insufficiency bilaterally. With that being said I do believe that this wound is currently showing some signs of improvement I do not see any obvious evidence of infection right now though that something we will keep a close eye on she has been on Ceftin this was December 18, 2022. She is not on that currently. Again she is on Coumadin. 02-11-2023 upon evaluation today patient appears to be doing decently well currently in regard to her leg wound. She is still is having some buildup of  slough and necrotic debris at this time. With that being said I do think that overall for 1 week's time she has done decently well. 02-18-2023 upon evaluation today patient appears to be doing well currently in regard to her wound although there is slough and biofilm buildup she has continued to have issues here with is slowly  making progress towards healing. Fortunately I do not see any signs of active infection locally nor systemically at this time. No fevers, chills, nausea, vomiting, or diarrhea. 02-25-2023 upon evaluation patient appears to be doing pretty well currently in regard to her wound which is showing signs of improvement slowly but surely. Fortunately I do not see any evidence of active infection locally nor systemically which is great news. No fevers, chills, nausea, vomiting, or diarrhea. 03-04-2023 upon evaluation patient's wound is doing well. I do think that she is ready for application of the Apligraf today without improvement regarding on hand. I would hope that we can get this moving in the right direction hopefully quickly with the use of the Apligraf. 03-11-2023 upon evaluation today patient appears to be doing well currently in regard to her wound. This is actually measuring smaller and looking much better. Fortunately I do not see any signs of active infection locally or systemically which is great news and in general I do think that we are making good headway towards complete closure. 03-20-2023 upon evaluation today patient's wound actually is showing signs of improvement based on what we are seeing at this point. I do believe that she is making good headway towards getting this closed. I believe the Apligraf has been beneficial for her at this time and I am very pleased in that regard. The wound looks significantly improved both in the overall appearance as well as the overall size and I am extremely happy at this time. I do believe that we should continue with the Apligraf as  such as I do feel this is really doing quite well. 7/23; traumatic wound in the setting of chronic venous insufficiency which was nonhealing. We applied Apligraf #4 today. Making nice improvements Heat-6-24 upon evaluation patient appears to be doing well currently in regard to her wound. In fact this is very close to complete closure. I do believe that she is making excellent headway and this will likely be her last Apligraf applied. [name], [name] ([id number]) [id number] Page 3 of 8 Electronic Signature(s) Signed: 04/15/2023 5:54:00 PM By: Jamie Derry PA-C Entered By: [name] on 04/15/2023 17:54:00 -------------------------------------------------------------------------------- Physical Exam Details Patient Name: Date of Service: [name], Jamie RA [name]. 04/15/2023 3:00 PM Medical Record Number: [id number] Patient Account Number: [id number] Date of Birth/Sex: Treating RN: 27-Nov-1934 (88 y.o. [name] Primary Care Provider: Bari [name] Other Clinician: Referring Provider: Treating Provider/Extender: [name] in Treatment: 10 Constitutional Well-nourished and well-hydrated in no acute distress. Respiratory normal breathing without difficulty. Psychiatric this patient is able to make decisions and demonstrates good insight into disease process. Alert and Oriented x 3. pleasant and cooperative. Notes Upon inspection patient's leg again is showing signs of complete closure and part of the wound although it does need a chance to toughen up in other parts of the still open I think that it is reasonable to apply this 1 more time to obtain hopefully the final closure by next week we may still have to keep this under check for another additional week to make sure that it softens up the right now this is doing great I am going to go ahead and apply Apligraf #5 today. Electronic Signature(s) Signed: 04/15/2023 5:54:28 PM By: Jamie Derry  PA-C Entered By: [name] on 04/15/2023 17:54:28 -------------------------------------------------------------------------------- Physician Orders Details Patient Name: Date of Service: [name] 04/15/2023 3:00 PM Medical Record Number: [id number] Patient Account Number: [id number] Date of Birth/Sex: Treating RN: 1935-05-27 (  88 y.o. [name] Primary Care Provider: Bari [name] Other Clinician: Referring Provider: Treating Provider/Extender: [name] in Treatment: 10 Verbal / Phone Orders: No Diagnosis Coding ICD-10 Coding Code Description ARRYANA, CAYE ([id number]) [id number] Page 4 of 8 [phone] Laceration without foreign body, right lower leg, initial encounter I87.331 Chronic venous hypertension (idiopathic) with ulcer and inflammation of right lower extremity L97.812 Non-pressure chronic ulcer of other part of right lower leg with fat layer exposed I48.0 Paroxysmal atrial fibrillation Z79.01 Long term (current) use of anticoagulants J44.9 Chronic obstructive pulmonary disease, unspecified I10 Essential (primary) hypertension 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 Follow-up Appointments Return Appointment in 1 week. Bathing/ Shower/ Hygiene Clean wound with Normal Saline or wound cleanser. May shower; gently cleanse wound with antibacterial soap, rinse and pat dry prior to dressing wounds No tub bath. Anesthetic (Use 'Patient Medications' Section for Anesthetic Order Entry) Lidocaine applied to wound bed Cellular or Tissue Based Products Cellular or Tissue Based Product Type: - apligraf applied #5, no further applications needed Cellular or Tissue Based Product applied to wound bed; including contact layer, fixation with steri-strips, dry gauze and cover dressing. (DO  NOT REMOVE). - steri strips, mepitel, gauze, roll guaze and tubi grip size c. Edema Control - Lymphedema / Segmental Compressive Device / Other Elevate, Exercise Daily and A void Standing for Long Periods of Time. Elevate leg(s) parallel to the floor when sitting. DO YOUR BEST to sleep in the bed at night. DO NOT sleep in your recliner. Long hours of sitting in a recliner leads to swelling of the legs and/or potential wounds on your backside. Wound Treatment Wound #1 - Lower Leg Wound Laterality: Right, Lateral Cleanser: Vashe 5.8 (oz) 1 x Per Week/30 Days Discharge Instructions: Use vashe 5.8 (oz) as directed Prim Dressing: Gauze 1 x Per Week/30 Days ary Discharge Instructions: As directed: dry Secondary Dressing: Conforming Guaze Roll-Small 1 x Per Week/30 Days Discharge Instructions: Apply Conforming Stretch Guaze Bandage as directed Secured With: Tubigrip Size C, 2.75x10 (in/yd) 1 x Per Week/30 Days Discharge Instructions: Apply 3 Tubigrip C 3-finger-widths below knee to base of toes to secure dressing and/or for swelling. Electronic Signature(s) Signed: 04/15/2023 4:18:36 PM By: [name] Signed: 04/15/2023 6:05:17 PM By: Jamie Derry PA-C Entered By: [name] on 04/15/2023 15:46:36 -------------------------------------------------------------------------------- Problem List Details Patient Name: Date of Service: [name] 04/15/2023 3:00 PM Medical Record Number: [id number] Patient Account Number: [id number] Date of Birth/Sex: Treating RN: 12/19/1934 (88 y.o. [name] Primary Care Provider: Bari [name] Other Clinician: Referring Provider: Treating Provider/Extender: [name] in Treatment: [address] [name] ([id number]) [id number] Page 5 of 8 Active Problems ICD-10 Encounter Code Description Active Date MDM Diagnosis S81.811A Laceration without foreign body, right lower leg, initial encounter  02/04/2023 No Yes I87.331 Chronic venous hypertension (idiopathic) with ulcer and inflammation of right 02/04/2023 No Yes lower extremity L97.812 Non-pressure chronic ulcer of other part of right lower leg with fat layer 02/04/2023 No Yes exposed I48.0 Paroxysmal atrial fibrillation 02/04/2023 No Yes Z79.01 Long term (current) use of anticoagulants 02/04/2023 No Yes J44.9 Chronic obstructive pulmonary disease, unspecified 02/04/2023 No Yes I10 Essential (primary) hypertension 02/04/2023 No Yes I25.10 Atherosclerotic heart disease of native coronary artery without angina pectoris 02/04/2023 No Yes I50.42 Chronic combined systolic (congestive) and diastolic (congestive) heart failure 02/04/2023 No Yes Inactive Problems Resolved Problems Electronic Signature(s) Signed: 04/15/2023  3:03:13 PM By: Jamie Derry PA-C Entered By: [name] on 04/15/2023 15:03:13 -------------------------------------------------------------------------------- Progress Note Details Patient Name: Date of Service: [name], Jamie RA [name]. 04/15/2023 3:00 PM Medical Record Number: [id number] Patient Account Number: [id number] Date of Birth/Sex: Treating RN: 09/01/1935 (88 y.o. [name] Primary Care Provider: Bari [name] Other Clinician: Referring Provider: Treating Provider/Extender: [name] in Treatment: [address] [name] ([id number]) [id number] Page 6 of 8 Subjective Chief Complaint Information obtained from Patient Right LE Ulcer History of Present Illness (HPI) 02-04-2023 patient presents today for initial evaluation here in our clinic concerning a wound which actually began around October 24, 2022. With that being said she notes that this was due to an injury and then has continued to be a problem not wanting to close and heal as quickly as would be expected. She is on long-term anticoagulant therapy due to atrial fibrillation. She also has hypertension, COPD,  coronary artery disease, congestive heart failure, and chronic venous insufficiency bilaterally. With that being said I do believe that this wound is currently showing some signs of improvement I do not see any obvious evidence of infection right now though that something we will keep a close eye on she has been on Ceftin this was December 18, 2022. She is not on that currently. Again she is on Coumadin. 02-11-2023 upon evaluation today patient appears to be doing decently well currently in regard to her leg wound. She is still is having some buildup of slough and necrotic debris at this time. With that being said I do think that overall for 1 week's time she has done decently well. 02-18-2023 upon evaluation today patient appears to be doing well currently in regard to her wound although there is slough and biofilm buildup she has continued to have issues here with is slowly making progress towards healing. Fortunately I do not see any signs of active infection locally nor systemically at this time. No fevers, chills, nausea, vomiting, or diarrhea. 02-25-2023 upon evaluation patient appears to be doing pretty well currently in regard to her wound which is showing signs of improvement slowly but surely. Fortunately I do not see any evidence of active infection locally nor systemically which is great news. No fevers, chills, nausea, vomiting, or diarrhea. 03-04-2023 upon evaluation patient's wound is doing well. I do think that she is ready for application of the Apligraf today without improvement regarding on hand. I would hope that we can get this moving in the right direction hopefully quickly with the use of the Apligraf. 03-11-2023 upon evaluation today patient appears to be doing well currently in regard to her wound. This is actually measuring smaller and looking much better. Fortunately I do not see any signs of active infection locally or systemically which is great news and in general I do think that we  are making good headway towards complete closure. 03-20-2023 upon evaluation today patient's wound actually is showing signs of improvement based on what we are seeing at this point. I do believe that she is making good headway towards getting this closed. I believe the Apligraf has been beneficial for her at this time and I am very pleased in that regard. The wound looks significantly improved both in the overall appearance as well as the overall size and I am extremely happy at this time. I do believe that we should continue with the Apligraf as such as I do feel this is really doing quite well. 7/23; traumatic wound in  the setting of chronic venous insufficiency which was nonhealing. We applied Apligraf #4 today. Making nice improvements Heat-6-24 upon evaluation patient appears to be doing well currently in regard to her wound. In fact this is very close to complete closure. I do believe that she is making excellent headway and this will likely be her last Apligraf applied. Objective Constitutional Well-nourished and well-hydrated in no acute distress. Vitals Time Taken: 2:51 PM, Height: 61 in, Weight: 108 lbs, BMI: 20.4, Temperature: 98.1 F, Pulse: 74 bpm, Respiratory Rate: 18 breaths/min, Blood Pressure: 127/59 mmHg. Respiratory normal breathing without difficulty. Psychiatric this patient is able to make decisions and demonstrates good insight into disease process. Alert and Oriented x 3. pleasant and cooperative. General Notes: Upon inspection patient's leg again is showing signs of complete closure and part of the wound although it does need a chance to toughen up in other parts of the still open I think that it is reasonable to apply this 1 more time to obtain hopefully the final closure by next week we may still have to keep this under check for another additional week to make sure that it softens up the right now this is doing great I am going to go ahead and apply Apligraf  #5 today. Integumentary (Hair, Skin) Wound #1 status is Open. Original cause of wound was Skin T ear/Laceration. The date acquired was: 10/24/2022. The wound has been in treatment 10 weeks. The wound is located on the Right,Lateral Lower Leg. The wound measures 2cm length x 0.5cm width x 0.1cm depth; 0.785cm^2 area and 0.079cm^3 volume. There is Fat Layer (Subcutaneous Tissue) exposed. There is no tunneling or undermining noted. There is a medium amount of serosanguineous drainage noted. The wound margin is distinct with the outline attached to the wound base. There is large (67-100%) red, pink granulation within the wound bed. There is a small (1-33%) amount of necrotic tissue within the wound bed including Adherent Slough. Assessment Active Problems ICD-10 Laceration without foreign body, right lower leg, initial encounter Chronic venous hypertension (idiopathic) with ulcer and inflammation of right lower extremity [name], [name] ([id number]) [id number] Page 7 of 8 Non-pressure chronic ulcer of other part of right lower leg with fat layer exposed Paroxysmal atrial fibrillation Long term (current) use of anticoagulants Chronic obstructive pulmonary disease, unspecified Essential (primary) hypertension Atherosclerotic heart disease of native coronary artery without angina pectoris Chronic combined systolic (congestive) and diastolic (congestive) heart failure Procedures Wound #1 Pre-procedure diagnosis of Wound #1 is a Skin T located on the Right,Lateral Lower Leg. A skin graft procedure using a bioengineered skin substitute/cellular ear or tissue based product was performed by Jamie Derry, PA-C with the following instrument(s): Blade, Forceps, and Scissors. Apligraf was applied and secured with Steri-Strips. 33 sq cm of product was utilized and 11 sq cm was wasted due to wound size. Post Application, mepitel was applied. A Time Out was conducted at 15:43, prior  to the start of the procedure. The procedure was tolerated well with a pain level of 0 throughout and a pain level of 0 following the procedure. Post procedure Diagnosis Wound #1: Same as Pre-Procedure . Plan Follow-up Appointments: Return Appointment in 1 week. Bathing/ Shower/ Hygiene: Clean wound with Normal Saline or wound cleanser. May shower; gently cleanse wound with antibacterial soap, rinse and pat dry prior to dressing wounds No tub bath. Anesthetic (Use 'Patient Medications' Section for Anesthetic Order Entry): Lidocaine applied to wound bed Cellular or Tissue Based Products: Cellular or Tissue Based Product  Type: - apligraf applied #5, no further applications needed Cellular or Tissue Based Product applied to wound bed; including contact layer, fixation with steri-strips, dry gauze and cover dressing. (DO NOT REMOVE). - steri strips, mepitel, gauze, roll guaze and tubi grip size c. Edema Control - Lymphedema / Segmental Compressive Device / Other: Elevate, Exercise Daily and Avoid Standing for Long Periods of Time. Elevate leg(s) parallel to the floor when sitting. DO YOUR BEST to sleep in the bed at night. DO NOT sleep in your recliner. Long hours of sitting in a recliner leads to swelling of the legs and/or potential wounds on your backside. WOUND #1: - Lower Leg Wound Laterality: Right, Lateral Cleanser: Vashe 5.8 (oz) 1 x Per Week/30 Days Discharge Instructions: Use vashe 5.8 (oz) as directed Prim Dressing: Gauze 1 x Per Week/30 Days ary Discharge Instructions: As directed: dry Secondary Dressing: Conforming Guaze Roll-Small 1 x Per Week/30 Days Discharge Instructions: Apply Conforming Stretch Guaze Bandage as directed Secured With: Tubigrip Size C, 2.75x10 (in/yd) 1 x Per Week/30 Days Discharge Instructions: Apply 3 Tubigrip C 3-finger-widths below knee to base of toes to secure dressing and/or for swelling. 1. Patient did tolerate the application and she had no  complications from this. Post application of the Apligraf we did go ahead and continue with the Tubigrip which I think is doing well after secured with rolled gauze. 2. I am also going to recommend the patient should continue to elevate her legs I think this still but most importance. We will see patient back for reevaluation in 1 week here in the clinic. If anything worsens or changes patient will contact our office for additional recommendations. Electronic Signature(s) Signed: 04/15/2023 5:55:06 PM By: Jamie Derry PA-C Entered By: [name] on 04/15/2023 17:55:05 [name] ([id number]) [id number] Page 8 of 8 -------------------------------------------------------------------------------- SuperBill Details Patient Name: Date of Service: [name], [name] 04/15/2023 Medical Record Number: [id number] Patient Account Number: [id number] Date of Birth/Sex: Treating RN: November 03, 1934 (88 y.o. [name] Primary Care Provider: Bari [name] Other Clinician: Referring Provider: Treating Provider/Extender: [name] in Treatment: 10 Diagnosis Coding ICD-10 Codes Code Description [phone] Laceration without foreign body, right lower leg, initial encounter I87.331 Chronic venous hypertension (idiopathic) with ulcer and inflammation of right lower extremity L97.812 Non-pressure chronic ulcer of other part of right lower leg with fat layer exposed I48.0 Paroxysmal atrial fibrillation Z79.01 Long term (current) use of anticoagulants J44.9 Chronic obstructive pulmonary disease, unspecified I10 Essential (primary) hypertension 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 Facility Procedures : CPT4 Code: [id number] Description: [phone] (Facility Use Only) Apligraf 44 SQ CM Modifier: Quantity: 44 : CPT4 Code:  [id number] Description: 15271 - SKIN SUB GRAFT TRNK/ARM/LEG ICD-10 Diagnosis Description L97.812 Non-pressure chronic ulcer of other part of right lower leg with fat layer expo Modifier: sed Quantity: 1 Physician Procedures : CPT4 Code Description Modifier [id number] 15271 - WC PHYS SKIN SUB GRAFT TRNK/ARM/LEG ICD-10 Diagnosis Description L97.812 Non-pressure chronic ulcer of other part of right lower leg with fat layer exposed Quantity: 1 Electronic Signature(s) Signed: 04/15/2023 5:55:25 PM By: Jamie Derry PA-C Entered By: [name] on 04/15/2023 17:55:25

## 2023-04-15 NOTE — Progress Notes (Signed)
Jamie, [name] [name] ([id number]) [id number] Page 1 of 9 Visit Report for 04/15/2023 Arrival Information Details Patient Name: Date of Service: Jamie [name], Jamie [name] 04/15/2023 3:00 PM Medical Record Number: [id number] Patient Account Number: [id number] Date of Birth/Sex: Treating RN: 1935-07-18 (88 y.o. Jamie [name] Primary Care : Jamie [name] Other Clinician: Referring : Treating /Extender: Jamie [name] in Treatment: 10 Visit Information History Since Last Visit Added or deleted any medications: Jamie Patient Arrived: Jamie [name] Any new allergies or adverse reactions: Jamie Arrival Time: 14:49 Had a fall or experienced change in Jamie Accompanied By: spouse activities of daily living that may affect Transfer Assistance: None risk of falls: Patient Identification Verified: Yes Hospitalized since last visit: Jamie Secondary Verification Process Completed: Yes Has Dressing in Place as Prescribed: Yes Patient Has Alerts: Yes Has Compression in Place as Prescribed: Yes Patient Alerts: Patient on Blood Thinner Pain Present Now: Jamie PACEMAKER 02/13/23 ABI [name] 02/13/23 ABI L 1.26 02/13/23 TBI [name] 0.91 02/13/23 TBI L 0.86 Electronic Signature(s) Signed: 04/15/2023 4:18:36 PM By: Jamie [name] Entered By: Jamie [name] on 04/15/2023 14:51:32 -------------------------------------------------------------------------------- Clinic Level of Care Assessment Details Patient Name: Date of Service: Jamie [name], Jamie [name] 04/15/2023 3:00 PM Medical Record Number: [id number] Patient Account Number: [id number] Date of Birth/Sex: Treating RN: 29-Apr-1935 (88 y.o. Jamie [name] Primary Care : Jamie [name] Other Clinician: Referring : Treating /Extender: Jamie [name] in Treatment: 10 Clinic Level of Care Assessment Items TOOL 1 Quantity Score []  - 0 Use when EandM and Procedure is performed on INITIAL  visit ASSESSMENTS - Nursing Assessment / Reassessment []  - 0 General Physical Exam (combine w/ comprehensive assessment (listed just below) when performed on new pt. evals) []  - 0 Comprehensive Assessment (HX, ROS, Risk Assessments, Wounds Hx, etc.) Jamie [name], Jamie [name] ([id number]) [phone].pdf Page 2 of 9 ASSESSMENTS - Wound and Skin Assessment / Reassessment []  - 0 Dermatologic / Skin Assessment (not related to wound area) ASSESSMENTS - Ostomy and/or Continence Assessment and Care []  - 0 Incontinence Assessment and Management []  - 0 Ostomy Care Assessment and Management (repouching, etc.) PROCESS - Coordination of Care []  - 0 Simple Patient / Family Education for ongoing care []  - 0 Complex (extensive) Patient / Family Education for ongoing care []  - 0 Staff obtains Chiropractor, Records, T Results / Process Orders est []  - 0 Staff telephones HHA, Nursing Homes / Clarify orders / etc []  - 0 Routine Transfer to another Facility (non-emergent condition) []  - 0 Routine Hospital Admission (non-emergent condition) []  - 0 New Admissions / Manufacturing engineer / Ordering NPWT Apligraf, etc. , []  - 0 Emergency Hospital Admission (emergent condition) PROCESS - Special Needs []  - 0 Pediatric / Minor Patient Management []  - 0 Isolation Patient Management []  - 0 Hearing / Language / Visual special needs []  - 0 Assessment of Community assistance (transportation, D/C planning, etc.) []  - 0 Additional assistance / Altered mentation []  - 0 Support Surface(s) Assessment (bed, cushion, seat, etc.) INTERVENTIONS - Miscellaneous []  - 0 External ear exam []  - 0 Patient Transfer (multiple staff / Nurse, adult / Similar devices) []  - 0 Simple Staple / Suture removal (25 or less) []  - 0 Complex Staple / Suture removal (26 or more) []  - 0 Hypo/Hyperglycemic Management (do not check if billed separately) []  - 0 Ankle / Brachial Index (ABI) - do not check if billed  separately Has the patient been seen at the hospital within the last three years: Yes Total Score:  0 Level Of Care: ____ Electronic Signature(s) Signed: 04/15/2023 4:18:36 PM By: Jamie [name] Entered By: Jamie [name] on 04/15/2023 15:46:42 -------------------------------------------------------------------------------- Encounter Discharge Information Details Patient Name: Date of Service: Jamie [name] RA [name] 04/15/2023 3:00 PM Medical Record Number: [id number] Patient Account Number: [id number] Date of Birth/Sex: Treating RN: 02-28-1935 (88 y.o. Jamie [name] Primary Care : Jamie [name] Other Clinician: Referring : Treating /Extender: Jamie [name] in Treatment: [address] [place] [name] ([id number]) [id number] Page 3 of 9 Encounter Discharge Information Items Post Procedure Vitals Discharge Condition: Stable Temperature (F): 98.1 Ambulatory Status: Cane Pulse (bpm): 74 Discharge Destination: Home Respiratory Rate (breaths/min): 18 Transportation: Private Auto Blood Pressure (mmHg): 127/59 Accompanied By: spouse Schedule Follow-up Appointment: Yes Clinical Summary of Care: Electronic Signature(s) Signed: 04/15/2023 4:18:36 PM By: Jamie [name] Entered By: Jamie [name] on 04/15/2023 15:55:46 -------------------------------------------------------------------------------- Lower Extremity Assessment Details Patient Name: Date of Service: Jamie [name], Jamie [name] 04/15/2023 3:00 PM Medical Record Number: [id number] Patient Account Number: [id number] Date of Birth/Sex: Treating RN: August 09, 1935 (88 y.o. Jamie [name] Primary Care : Jamie [name] Other Clinician: Referring : Treating /Extender: Jamie [name] in Treatment: 10 Edema Assessment Assessed: Jamie [name]: Jamie] [Right: Jamie] Edema: [Left: N] [Right: o] Calf Left: Right: Point of Measurement: 32 cm From Medial Instep  27.5 cm Ankle Left: Right: Point of Measurement: 10 cm From Medial Instep 17.9 cm Vascular Assessment Pulses: Dorsalis Pedis Palpable: [Right:Yes] Posterior Tibial Palpable: [Right:Yes] Extremity colors, hair growth, and conditions: Extremity Color: [Right:Normal] Hair Growth on Extremity: [Right:Jamie] Temperature of Extremity: [Right:Warm < 3 seconds] Toe Nail Assessment Left: Right: Thick: Yes Discolored: Yes Deformed: Jamie Improper Length and Hygiene: Jamie Electronic Signature(s) Signed: 04/15/2023 4:18:36 PM By: Kathie Rhodes, Arna Medici [name] ([id number]) [id number] Page 4 of 9 Entered By: Jamie [name] on 04/15/2023 15:00:02 -------------------------------------------------------------------------------- Multi Wound Chart Details Patient Name: Date of Service: Jamie [name], LITTERIO. 04/15/2023 3:00 PM Medical Record Number: [id number] Patient Account Number: [id number] Date of Birth/Sex: Treating RN: [birth year] (88 y.o. Jamie [name] Primary Care : Jamie [name] Other Clinician: Referring : Treating /Extender: Jamie [name] in Treatment: 10 Vital Signs Height(in): 61 Pulse(bpm): 74 Weight(lbs): 108 Blood Pressure(mmHg): 127/59 Body Mass Index(BMI): 20.4 Temperature(F): 98.1 Respiratory Rate(breaths/min): 18 [1:Photos:] [N/A:N/A] Right, Lateral Lower Leg N/A N/A Wound Location: Skin T ear/Laceration N/A N/A Wounding Event: Skin T ear N/A N/A Primary Etiology: Chronic Obstructive Pulmonary N/A N/A Comorbid History: Disease (COPD), Arrhythmia, Congestive Heart Failure, Coronary Artery Disease, Hypertension, Received Radiation 10/24/2022 N/A N/A Date Acquired: 10 N/A N/A Weeks of Treatment: Open N/A N/A Wound Status: Jamie N/A N/A Wound Recurrence: 2x0.5x0.1 N/A N/A Measurements L x W x D (cm) 0.785 N/A N/A A (cm) : rea 0.079 N/A N/A Volume (cm) : 83.20% N/A N/A % Reduction in  Area: 83.10% N/A N/A % Reduction in Volume: Full Thickness Without Exposed N/A N/A Classification: Support Structures Medium N/A N/A Exudate Amount: Serosanguineous N/A N/A Exudate Type: red, brown N/A N/A Exudate Color: Distinct, outline attached N/A N/A Wound Margin: Large (67-100%) N/A N/A Granulation Amount: Red, Pink N/A N/A Granulation Quality: Small (1-33%) N/A N/A Necrotic Amount: Fat Layer (Subcutaneous Tissue): Yes N/A N/A Exposed Structures: Fascia: Jamie Tendon: Jamie Muscle: Jamie Joint: Jamie Bone: Jamie Medium (34-66%) N/A N/A Epithelialization: Treatment Notes Jamie [name], Jamie [name] ([id number]) [id number] Page 5 of 9 Electronic Signature(s) Signed: 04/15/2023 4:18:36 PM By: Jamie [name] Entered By: Jamie [name] on 04/15/2023 15:42:38 -------------------------------------------------------------------------------- Multi-Disciplinary Care Plan Details Patient Name: Date of Service:  Jamie [name], Jamie [name] 04/15/2023 3:00 PM Medical Record Number: [id number] Patient Account Number: [id number] Date of Birth/Sex: Treating RN: 03-27-1935 (88 y.o. Jamie [name] Primary Care : Jamie [name] Other Clinician: Referring : Treating /Extender: Jamie [name] in Treatment: 10 Active Inactive Wound/Skin Impairment Nursing Diagnoses: Impaired tissue integrity Knowledge deficit related to ulceration/compromised skin integrity Goals: Ulcer/skin breakdown will have a volume reduction of 30% by week 4 Date Initiated: 02/04/2023 Date Inactivated: 03/04/2023 Target Resolution Date: 03/04/2023 Goal Status: Unmet Unmet Reason: comorbidities Ulcer/skin breakdown will have a volume reduction of 50% by week 8 Date Initiated: 02/04/2023 Date Inactivated: 04/15/2023 Target Resolution Date: 04/01/2023 Goal Status: Met Ulcer/skin breakdown will have a volume reduction of 80% by week 12 Date Initiated: 02/04/2023 Target Resolution  Date: 04/29/2023 Goal Status: Active Ulcer/skin breakdown will heal within 14 weeks Date Initiated: 02/04/2023 Target Resolution Date: 05/13/2023 Goal Status: Active Interventions: Assess patient/caregiver ability to obtain necessary supplies Assess patient/caregiver ability to perform ulcer/skin care regimen upon admission and as needed Assess ulceration(s) every visit Provide education on ulcer and skin care Treatment Activities: Skin care regimen initiated : 02/04/2023 Notes: Electronic Signature(s) Signed: 04/15/2023 4:18:36 PM By: Jamie [name] Entered By: Jamie [name] on 04/15/2023 15:54:57 Carma Lair [name] ([id number]) [id number] Page 6 of 9 -------------------------------------------------------------------------------- Pain Assessment Details Patient Name: Date of Service: Jamie [name], Jamie [name] 04/15/2023 3:00 PM Medical Record Number: [id number] Patient Account Number: [id number] Date of Birth/Sex: Treating RN: 04/29/1935 (88 y.o. Jamie [name] Primary Care : Jamie [name] Other Clinician: Referring : Treating /Extender: Jamie [name] in Treatment: 10 Active Problems Location of Pain Severity and Description of Pain Patient Has Paino Jamie Site Locations Rate the pain. Current Pain Level: 0 Pain Management and Medication Current Pain Management: Electronic Signature(s) Signed: 04/15/2023 4:18:36 PM By: Jamie [name] Entered By: Jamie [name] on 04/15/2023 14:53:28 -------------------------------------------------------------------------------- Patient/Caregiver Education Details Patient Name: Date of Service: Jamie [name] 8/6/2024andnbsp3:00 PM Medical Record Number: [id number] Patient Account Number: [id number] Date of Birth/Gender: Treating RN: April 21, 1935 (88 y.o. Jamie [name] Primary Care Physician: Jamie [name] Other Clinician: Referring Physician: Treating Physician/Extender:  Jamie [name] in Treatment: [address], [place] [name] ([id number]) [id number] Page 7 of 9 Education Assessment Education Provided To: Patient Education Topics Provided Wound/Skin Impairment: Handouts: Caring for Your Ulcer Methods: Explain/Verbal Responses: State content correctly Electronic Signature(s) Signed: 04/15/2023 4:18:36 PM By: Jamie [name] Entered By: Jamie [name] on 04/15/2023 15:55:06 -------------------------------------------------------------------------------- Wound Assessment Details Patient Name: Date of Service: Jamie [name], Jamie [name] 04/15/2023 3:00 PM Medical Record Number: [id number] Patient Account Number: [id number] Date of Birth/Sex: Treating RN: Sep 14, 1934 (88 y.o. Jamie [name] Primary Care : Jamie [name] Other Clinician: Referring : Treating /Extender: Jamie [name] in Treatment: 10 Wound Status Wound Number: 1 Primary Skin T ear Etiology: Wound Location: Right, Lateral Lower Leg Wound Open Wounding Event: Skin Tear/Laceration Status: Date Acquired: 10/24/2022 Comorbid Chronic Obstructive Pulmonary Disease (COPD), Arrhythmia, Weeks Of Treatment: 10 History: Congestive Heart Failure, Coronary Artery Disease, Hypertension, Clustered Wound: Jamie Received Radiation Photos Wound Measurements Length: (cm) 2 Width: (cm) 0.5 Depth: (cm) 0.1 Area: (cm) 0.785 Volume: (cm) 0.079 % Reduction in Area: 83.2% % Reduction in Volume: 83.1% Epithelialization: Medium (34-66%) Tunneling: Jamie Undermining: Jamie Wound Description Classification: Full Thickness Without Exposed Support Structures Wound Margin: Distinct, outline attached Exudate Amount: Medium Jamie [name], Tamia [name] ([id number]) Exudate Type: Serosanguineous Exudate Color: red, brown Foul Odor After Cleansing: Jamie Slough/Fibrino Yes [phone].pdf Page 8 of  9 Wound Bed Granulation Amount: Large  (67-100%) Exposed Structure Granulation Quality: Red, Pink Fascia Exposed: Jamie Necrotic Amount: Small (1-33%) Fat Layer (Subcutaneous Tissue) Exposed: Yes Necrotic Quality: Adherent Slough Tendon Exposed: Jamie Muscle Exposed: Jamie Joint Exposed: Jamie Bone Exposed: Jamie Treatment Notes Wound #1 (Lower Leg) Wound Laterality: Right, Lateral Cleanser Vashe 5.8 (oz) Discharge Instruction: Use vashe 5.8 (oz) as directed Peri-Wound Care Topical Primary Dressing Gauze Discharge Instruction: As directed: dry Secondary Dressing Conforming Guaze Roll-Small Discharge Instruction: Apply Conforming Stretch Guaze Bandage as directed Secured With Tubigrip Size C, 2.75x10 (in/yd) Discharge Instruction: Apply 3 Tubigrip C 3-finger-widths below knee to base of toes to secure dressing and/or for swelling. Compression Wrap Compression Stockings Add-Ons Electronic Signature(s) Signed: 04/15/2023 4:18:36 PM By: Jamie [name] Entered By: Jamie [name] on 04/15/2023 15:03:27 -------------------------------------------------------------------------------- Vitals Details Patient Name: Date of Service: Jamie [name] RA [name] 04/15/2023 3:00 PM Medical Record Number: [id number] Patient Account Number: [id number] Date of Birth/Sex: Treating RN: [birth year] (88 y.o. Jamie [name] Primary Care : Jamie [name] Other Clinician: Referring : Treating /Extender: Jamie [name] in Treatment: 10 Vital Signs Time Taken: 14:51 Temperature (F): 98.1 Height (in): 61 Pulse (bpm): 74 Weight (lbs): 108 Respiratory Rate (breaths/min): 18 Body Mass Index (BMI): 20.4 Blood Pressure (mmHg): 127/59 Reference Range: 80 - 120 mg / dl RENNATA, SELVAGGIO [name] ([id number]) [id number] Page 9 of 9 Electronic Signature(s) Signed: 04/15/2023 4:18:36 PM By: Jamie [name] Entered By: Jamie [name] on 04/15/2023 14:53:21

## 2023-04-16 ENCOUNTER — Ambulatory Visit: Payer: Medicare Other

## 2023-04-16 DIAGNOSIS — Z953 Presence of xenogenic heart valve: Secondary | ICD-10-CM

## 2023-04-16 DIAGNOSIS — Z5181 Encounter for therapeutic drug level monitoring: Secondary | ICD-10-CM | POA: Insufficient documentation

## 2023-04-16 DIAGNOSIS — I340 Nonrheumatic mitral (valve) insufficiency: Secondary | ICD-10-CM | POA: Insufficient documentation

## 2023-04-16 LAB — POCT INR: INR: 2.3 (ref 2.0–3.0)

## 2023-04-16 NOTE — Patient Instructions (Signed)
Continue 1 tablet daily EXCEPT 0.5 TABLET ON MONDAYS, WEDNESDAYS, and FRIDAYS - Recheck INR in 6 weeks.

## 2023-04-17 ENCOUNTER — Telehealth: Payer: Self-pay | Admitting: Internal Medicine

## 2023-04-17 NOTE — Telephone Encounter (Signed)
Patient unable to attend visit on 8/14 due to her husband having surgery that day. We attempted to reschedule, but Dr Belia Heman is booked for all his days in the available calendar. Are we going to be able to reschedule her or will she need to wait for next batch of published dates?

## 2023-04-17 NOTE — Telephone Encounter (Signed)
Per Dr. Clovis Fredrickson last office visit note from 10/24/2022, the patient is to follow up in 1 year.   I spoke with the patient. She is not having any current breathing problems and she is ok with following up in Feb 2025. She knows we will have to call her back with that appt once the schedule it opened.  Nothing further needed.

## 2023-04-23 ENCOUNTER — Ambulatory Visit: Payer: Medicare Other | Admitting: Internal Medicine

## 2023-04-24 ENCOUNTER — Ambulatory Visit: Payer: Medicare Other | Admitting: Physician Assistant

## 2023-04-25 ENCOUNTER — Encounter: Payer: Medicare Other | Admitting: Physician Assistant

## 2023-04-25 DIAGNOSIS — I87331 Chronic venous hypertension (idiopathic) with ulcer and inflammation of right lower extremity: Secondary | ICD-10-CM | POA: Diagnosis not present

## 2023-04-25 NOTE — Progress Notes (Addendum)
DISIREE, SALTER ([id number]) [id number] Page 1 of 7 Visit Report for 04/25/2023 Chief Complaint Document Details Patient Name: Date of Service: [name], CODAY. 04/25/2023 7:30 A M Medical Record Number: [id number] Patient Account Number: [id number] Date of Birth/Sex: Treating RN: 1934-10-15 (88 y.o. [name] Primary Care Provider: Bari Edward Other Clinician: Referring Provider: Treating Provider/Extender: Francee Piccolo in Treatment: 11 Information Obtained from: Patient Chief Complaint Right LE Ulcer Electronic Signature(s) Signed: 04/25/2023 7:49:55 AM By: Allen Derry PA-C Entered By: Allen Derry on 04/25/2023 07:49:55 -------------------------------------------------------------------------------- HPI Details Patient Name: Date of Service: [name] RA R. 04/25/2023 7:30 A M Medical Record Number: [id number] Patient Account Number: [id number] Date of Birth/Sex: Treating RN: 07/23/1935 (88 y.o. [name] Primary Care Provider: Bari Edward Other Clinician: Referring Provider: Treating Provider/Extender: Francee Piccolo in Treatment: 11 History of Present Illness HPI Description: 02-04-2023 patient presents today for initial evaluation here in our clinic concerning a wound which actually began around October 24, 2022. With that being said she notes that this was due to an injury and then has continued to be a problem not wanting to close and heal as quickly as would be expected. She is on long-term anticoagulant therapy due to atrial fibrillation. She also has hypertension, COPD, coronary artery disease, congestive heart failure, and chronic venous insufficiency bilaterally. With that being said I do believe that this wound is currently showing some signs of improvement I do not see any obvious evidence of infection right now though that something we will keep a close eye on she has been on Ceftin this  was December 18, 2022. She is not on that currently. Again she is on Coumadin. 02-11-2023 upon evaluation today patient appears to be doing decently well currently in regard to her leg wound. She is still is having some buildup of slough and necrotic debris at this time. With that being said I do think that overall for 1 week's time she has done decently well. 02-18-2023 upon evaluation today patient appears to be doing well currently in regard to her wound although there is slough and biofilm buildup she has continued to have issues here with is slowly making progress towards healing. Fortunately I do not see any signs of active infection locally nor systemically at this time. No fevers, chills, nausea, vomiting, or diarrhea. 02-25-2023 upon evaluation patient appears to be doing pretty well currently in regard to her wound which is showing signs of improvement slowly but surely. Fortunately I do not see any evidence of active infection locally nor systemically which is great news. No fevers, chills, nausea, vomiting, or diarrhea. [name], MESKO ([id number]) [id number] Page 2 of 7 03-04-2023 upon evaluation patient's wound is doing well. I do think that she is ready for application of the Apligraf today without improvement regarding on hand. I would hope that we can get this moving in the right direction hopefully quickly with the use of the Apligraf. 03-11-2023 upon evaluation today patient appears to be doing well currently in regard to her wound. This is actually measuring smaller and looking much better. Fortunately I do not see any signs of active infection locally or systemically which is great news and in general I do think that we are making good headway towards complete closure. 03-20-2023 upon evaluation today patient's wound actually is showing signs of improvement based on what we are seeing at this point. I do believe that she is making good headway towards getting this  closed. I believe the Apligraf has been beneficial for her at this time and I am very pleased in that regard. The wound looks significantly improved both in the overall appearance as well as the overall size and I am extremely happy at this time. I do believe that we should continue with the Apligraf as such as I do feel this is really doing quite well. 7/23; traumatic wound in the setting of chronic venous insufficiency which was nonhealing. We applied Apligraf #4 today. Making nice improvements Heat-6-24 upon evaluation patient appears to be doing well currently in regard to her wound. In fact this is very close to complete closure. I do believe that she is making excellent headway and this will likely be her last Apligraf applied. 04-25-2023 upon evaluation today patient appears to be doing excellent in regard to her wound which is actually showing signs of great improvement. Fortunately I do not see any evidence of active infection locally or systemically which is great news and in general I do believe she is making excellent headway towards complete closure. Electronic Signature(s) Signed: 04/25/2023 7:55:55 AM By: Allen Derry PA-C Entered By: Allen Derry on 04/25/2023 07:55:55 -------------------------------------------------------------------------------- Physical Exam Details Patient Name: Date of Service: [name], Jamie RA R. 04/25/2023 7:30 A M Medical Record Number: [id number] Patient Account Number: [id number] Date of Birth/Sex: Treating RN: [birth year] (88 y.o. [name] Primary Care Provider: Bari Edward Other Clinician: Referring Provider: Treating Provider/Extender: Francee Piccolo in Treatment: 11 Constitutional Well-nourished and well-hydrated in no acute distress. Respiratory normal breathing without difficulty. Psychiatric this patient is able to make decisions and demonstrates good insight into disease process. Alert and Oriented x 3. pleasant and  cooperative. Notes Upon inspection patient's wound bed actually showed signs of good granulation epithelization at this point. Fortunately I do not see any signs of worsening overall I do believe that the patient is making headway towards completely healing and overall I am extremely pleased with where we stand at this point. Electronic Signature(s) Signed: 04/25/2023 7:56:15 AM By: Allen Derry PA-C Entered By: Allen Derry on 04/25/2023 07:56:15 Dea, Mardene Sayer ([id number]) [id number] Page 3 of 7 -------------------------------------------------------------------------------- Physician Orders Details Patient Name: Date of Service: REGENIA, LAMY RA R. 04/25/2023 7:30 A M Medical Record Number: [id number] Patient Account Number: [id number] Date of Birth/Sex: Treating RN: 09-Jan-1935 (88 y.o. [name] Primary Care Provider: Bari Edward Other Clinician: Referring Provider: Treating Provider/Extender: Francee Piccolo in Treatment: 11 Verbal / Phone Orders: No Diagnosis Coding ICD-10 Coding Code Description [id number] Laceration without foreign body, right lower leg, initial encounter I87.331 Chronic venous hypertension (idiopathic) with ulcer and inflammation of right lower extremity L97.812 Non-pressure chronic ulcer of other part of right lower leg with fat layer exposed I48.0 Paroxysmal atrial fibrillation Z79.01 Long term (current) use of anticoagulants J44.9 Chronic obstructive pulmonary disease, unspecified I10 Essential (primary) hypertension 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 Follow-up Appointments Return Appointment in 1 week. Bathing/ Shower/ Hygiene Clean wound with Normal Saline or wound cleanser. May shower; gently cleanse wound with antibacterial soap, rinse and pat dry prior to dressing  wounds No tub bath. Anesthetic (Use 'Patient Medications' Section for Anesthetic Order Entry) Lidocaine applied to wound bed Edema Control - Lymphedema / Segmental Compressive Device / Other Elevate, Exercise Daily and A void Standing for Long Periods of Time. Elevate leg(s) parallel to the floor when sitting. DO YOUR  BEST to sleep in the bed at night. DO NOT sleep in your recliner. Long hours of sitting in a recliner leads to swelling of the legs and/or potential wounds on your backside. Wound Treatment Wound #1 - Lower Leg Wound Laterality: Right, Lateral Cleanser: Vashe 5.8 (oz) 3 x Per Week/30 Days Discharge Instructions: Use vashe 5.8 (oz) as directed Prim Dressing: Silvercel Small 2x2 (in/in) 3 x Per Week/30 Days ary Discharge Instructions: Apply Silvercel Small 2x2 (in/in) as instructed Secondary Dressing: Conforming Guaze Roll-Small 3 x Per Week/30 Days Discharge Instructions: Apply Conforming Stretch Guaze Bandage as directed Secured With: Tubigrip Size C, 2.75x10 (in/yd) 3 x Per Week/30 Days Discharge Instructions: Apply 3 Tubigrip C 3-finger-widths below knee to base of toes to secure dressing and/or for swelling. Electronic Signature(s) Signed: 04/25/2023 1:41:07 PM By: Bonnell Public Signed: 04/25/2023 3:00:55 PM By: Allen Derry PA-C Entered By: Bonnell Public on 04/25/2023 07:53:55 Blumberg, Mardene Sayer ([id number]) [id number] Page 4 of 7 -------------------------------------------------------------------------------- Problem List Details Patient Name: Date of Service: [name], [name] R. 04/25/2023 7:30 A M Medical Record Number: [id number] Patient Account Number: [id number] Date of Birth/Sex: Treating RN: [birth year] (88 y.o. [name] Primary Care Provider: Bari Edward Other Clinician: Referring Provider: Treating Provider/Extender: Francee Piccolo in Treatment: 11 Active Problems ICD-10 Encounter Code Description Active  Date MDM Diagnosis S81.811A Laceration without foreign body, right lower leg, initial encounter 02/04/2023 No Yes I87.331 Chronic venous hypertension (idiopathic) with ulcer and inflammation of right 02/04/2023 No Yes lower extremity L97.812 Non-pressure chronic ulcer of other part of right lower leg with fat layer 02/04/2023 No Yes exposed I48.0 Paroxysmal atrial fibrillation 02/04/2023 No Yes Z79.01 Long term (current) use of anticoagulants 02/04/2023 No Yes J44.9 Chronic obstructive pulmonary disease, unspecified 02/04/2023 No Yes I10 Essential (primary) hypertension 02/04/2023 No Yes I25.10 Atherosclerotic heart disease of native coronary artery without angina pectoris 02/04/2023 No Yes I50.42 Chronic combined systolic (congestive) and diastolic (congestive) heart failure 02/04/2023 No Yes Inactive Problems Resolved Problems Electronic Signature(s) Signed: 04/25/2023 7:49:50 AM By: Allen Derry PA-C Entered By: Allen Derry on 04/25/2023 07:49:50 Stork, Mardene Sayer ([id number]) [id number] Page 5 of 7 -------------------------------------------------------------------------------- Progress Note Details Patient Name: Date of Service: [name], WEGE RA R. 04/25/2023 7:30 A M Medical Record Number: [id number] Patient Account Number: [id number] Date of Birth/Sex: Treating RN: 1934-11-13 (88 y.o. [name] Primary Care Provider: Bari Edward Other Clinician: Referring Provider: Treating Provider/Extender: Francee Piccolo in Treatment: 11 Subjective Chief Complaint Information obtained from Patient Right LE Ulcer History of Present Illness (HPI) 02-04-2023 patient presents today for initial evaluation here in our clinic concerning a wound which actually began around October 24, 2022. With that being said she notes that this was due to an injury and then has continued to be a problem not wanting to close and heal as quickly as would be expected. She is  on long-term anticoagulant therapy due to atrial fibrillation. She also has hypertension, COPD, coronary artery disease, congestive heart failure, and chronic venous insufficiency bilaterally. With that being said I do believe that this wound is currently showing some signs of improvement I do not see any obvious evidence of infection right now though that something we will keep a close eye on she has been on Ceftin this was December 18, 2022. She is not on that currently. Again she is on Coumadin. 02-11-2023 upon evaluation today patient appears to be doing decently well currently in regard to her leg wound. She is still is  having some buildup of slough and necrotic debris at this time. With that being said I do think that overall for 1 week's time she has done decently well. 02-18-2023 upon evaluation today patient appears to be doing well currently in regard to her wound although there is slough and biofilm buildup she has continued to have issues here with is slowly making progress towards healing. Fortunately I do not see any signs of active infection locally nor systemically at this time. No fevers, chills, nausea, vomiting, or diarrhea. 02-25-2023 upon evaluation patient appears to be doing pretty well currently in regard to her wound which is showing signs of improvement slowly but surely. Fortunately I do not see any evidence of active infection locally nor systemically which is great news. No fevers, chills, nausea, vomiting, or diarrhea. 03-04-2023 upon evaluation patient's wound is doing well. I do think that she is ready for application of the Apligraf today without improvement regarding on hand. I would hope that we can get this moving in the right direction hopefully quickly with the use of the Apligraf. 03-11-2023 upon evaluation today patient appears to be doing well currently in regard to her wound. This is actually measuring smaller and looking much better. Fortunately I do not see any signs  of active infection locally or systemically which is great news and in general I do think that we are making good headway towards complete closure. 03-20-2023 upon evaluation today patient's wound actually is showing signs of improvement based on what we are seeing at this point. I do believe that she is making good headway towards getting this closed. I believe the Apligraf has been beneficial for her at this time and I am very pleased in that regard. The wound looks significantly improved both in the overall appearance as well as the overall size and I am extremely happy at this time. I do believe that we should continue with the Apligraf as such as I do feel this is really doing quite well. 7/23; traumatic wound in the setting of chronic venous insufficiency which was nonhealing. We applied Apligraf #4 today. Making nice improvements Heat-6-24 upon evaluation patient appears to be doing well currently in regard to her wound. In fact this is very close to complete closure. I do believe that she is making excellent headway and this will likely be her last Apligraf applied. 04-25-2023 upon evaluation today patient appears to be doing excellent in regard to her wound which is actually showing signs of great improvement. Fortunately I do not see any evidence of active infection locally or systemically which is great news and in general I do believe she is making excellent headway towards complete closure. Objective Constitutional Well-nourished and well-hydrated in no acute distress. Vitals Time Taken: 7:36 AM, Height: 61 in, Weight: 108 lbs, BMI: 20.4, Temperature: 97.6 F, Pulse: 84 bpm, Respiratory Rate: 18 breaths/min, Blood Pressure: 134/67 mmHg. [name], [name] ([id number]) [id number] Page 6 of 7 Respiratory normal breathing without difficulty. Psychiatric this patient is able to make decisions and demonstrates good insight into disease process. Alert and Oriented x  3. pleasant and cooperative. General Notes: Upon inspection patient's wound bed actually showed signs of good granulation epithelization at this point. Fortunately I do not see any signs of worsening overall I do believe that the patient is making headway towards completely healing and overall I am extremely pleased with where we stand at this point. Integumentary (Hair, Skin) Wound #1 status is Open. Original cause of wound was Skin  T ear/Laceration. The date acquired was: 10/24/2022. The wound has been in treatment 11 weeks. The wound is located on the Right,Lateral Lower Leg. The wound measures 2cm length x 0.5cm width x 0.1cm depth; 0.785cm^2 area and 0.079cm^3 volume. There is Fat Layer (Subcutaneous Tissue) exposed. There is a medium amount of serosanguineous drainage noted. The wound margin is distinct with the outline attached to the wound base. There is large (67-100%) red, pink granulation within the wound bed. There is no necrotic tissue within the wound bed. Assessment Active Problems ICD-10 Laceration without foreign body, right lower leg, initial encounter Chronic venous hypertension (idiopathic) with ulcer and inflammation of right lower extremity Non-pressure chronic ulcer of other part of right lower leg with fat layer exposed Paroxysmal atrial fibrillation Long term (current) use of anticoagulants Chronic obstructive pulmonary disease, unspecified Essential (primary) hypertension Atherosclerotic heart disease of native coronary artery without angina pectoris Chronic combined systolic (congestive) and diastolic (congestive) heart failure Plan Follow-up Appointments: Return Appointment in 1 week. Bathing/ Shower/ Hygiene: Clean wound with Normal Saline or wound cleanser. May shower; gently cleanse wound with antibacterial soap, rinse and pat dry prior to dressing wounds No tub bath. Anesthetic (Use 'Patient Medications' Section for Anesthetic Order Entry): Lidocaine  applied to wound bed Edema Control - Lymphedema / Segmental Compressive Device / Other: Elevate, Exercise Daily and Avoid Standing for Long Periods of Time. Elevate leg(s) parallel to the floor when sitting. DO YOUR BEST to sleep in the bed at night. DO NOT sleep in your recliner. Long hours of sitting in a recliner leads to swelling of the legs and/or potential wounds on your backside. WOUND #1: - Lower Leg Wound Laterality: Right, Lateral Cleanser: Vashe 5.8 (oz) 3 x Per Week/30 Days Discharge Instructions: Use vashe 5.8 (oz) as directed Prim Dressing: Silvercel Small 2x2 (in/in) 3 x Per Week/30 Days ary Discharge Instructions: Apply Silvercel Small 2x2 (in/in) as instructed Secondary Dressing: Conforming Guaze Roll-Small 3 x Per Week/30 Days Discharge Instructions: Apply Conforming Stretch Guaze Bandage as directed Secured With: Tubigrip Size C, 2.75x10 (in/yd) 3 x Per Week/30 Days Discharge Instructions: Apply 3 Tubigrip C 3-finger-widths below knee to base of toes to secure dressing and/or for swelling. 1. I am going to recommend that we have the patient going continue to monitor for any signs of infection or worsening. Based on what I am seeing I do think she is doing extremely well this is pretty much about healed. I would recommend however that what we do is go ahead and utilize the silver alginate just to keep this dry and hopefully allow it to toughen up. 2 were also going to continue with the Tubigrip size C she is doing very well with this and also can recommend she changes 3 times per week. We will see patient back for reevaluation in 1 week here in the clinic. If anything worsens or changes patient will contact our office for additional recommendations. Electronic Signature(s) Signed: 04/25/2023 7:56:51 AM By: Allen Derry PA-C Entered By: Allen Derry on 04/25/2023 07:56:51 Pinder, Mardene Sayer ([id number]) [id number] Page 7 of  7 -------------------------------------------------------------------------------- SuperBill Details Patient Name: Date of Service: ATIYA, [name] 04/25/2023 Medical Record Number: [id number] Patient Account Number: [id number] Date of Birth/Sex: Treating RN: [birth year] (88 y.o. [name] Primary Care Provider: Bari Edward Other Clinician: Referring Provider: Treating Provider/Extender: Francee Piccolo in Treatment: 11 Diagnosis Coding ICD-10 Codes Code Description [phone] Laceration without foreign body, right lower leg, initial encounter I87.331 Chronic venous  hypertension (idiopathic) with ulcer and inflammation of right lower extremity L97.812 Non-pressure chronic ulcer of other part of right lower leg with fat layer exposed I48.0 Paroxysmal atrial fibrillation Z79.01 Long term (current) use of anticoagulants J44.9 Chronic obstructive pulmonary disease, unspecified I10 Essential (primary) hypertension 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 Facility Procedures : CPT4 Code: [id number] Description: 99213 - WOUND CARE VISIT-LEV 3 EST PT Modifier: Quantity: 1 Physician Procedures : CPT4 Code Description Modifier [id number] 99213 - WC PHYS LEVEL 3 - EST PT ICD-10 Diagnosis Description S81.811A Laceration without foreign body, right lower leg, initial encounter I87.331 Chronic venous hypertension (idiopathic) with ulcer and  inflammation of right lower extremity L97.812 Non-pressure chronic ulcer of other part of right lower leg with fat layer exposed I48.0 Paroxysmal atrial fibrillation Quantity: 1 Electronic Signature(s) Signed: 04/25/2023 1:41:07 PM By: Bonnell Public Signed: 04/25/2023 3:00:55 PM By: Allen Derry PA-C Previous Signature: 04/25/2023 7:58:19 AM Version By: Allen Derry PA-C Entered By: Bonnell Public on 04/25/2023  08:02:52

## 2023-04-25 NOTE — Progress Notes (Signed)
CABELA, GUMMER ([id number]) [id number] Page 1 of 9 Visit Report for 04/25/2023 Arrival Information Details Patient Name: Date of Service: [name], [name] 04/25/2023 7:30 A [name] Medical Record Number: [id number] Patient Account Number: [id number] Date of Birth/Sex: Treating RN: 01-01-1935 (88 y.o. [name], [name] Primary Care [name]: [name] Other Clinician: Referring [name]: Treating [name]/Extender: [name] in Treatment: 11 Visit Information History Since Last Visit All ordered tests and consults were completed: No Patient Arrived: [name] Added or deleted any medications: No Arrival Time: 07:34 Any new allergies or adverse reactions: No Transfer Assistance: None Had a fall or experienced change in No Patient Has Alerts: Yes activities of daily living that may affect Patient Alerts: Patient on Blood Thinner risk of falls: PACEMAKER Pain Present Now: No 02/13/23 ABI [name] 02/13/23 ABI L 1.26 02/13/23 TBI [name] 0.91 02/13/23 TBI L 0.86 Electronic Signature(s) Signed: 04/25/2023 1:41:07 PM By: Bonnell Public Entered By: Bonnell Public on 04/25/2023 07:38:45 -------------------------------------------------------------------------------- Clinic Level of Care Assessment Details Patient Name: Date of Service: [name], [name] 04/25/2023 7:30 A [name] Medical Record Number: [id number] Patient Account Number: [id number] Date of Birth/Sex: Treating RN: 08-21-1935 (88 y.o. [name], [name] Primary Care Thana Ramp: [name] Other Clinician: Referring [name]: Treating [name]/Extender: [name] in Treatment: 11 Clinic Level of Care Assessment Items TOOL 4 Quantity Score []  - 0 Use when only an EandM is performed on FOLLOW-UP visit ASSESSMENTS - Nursing Assessment / Reassessment X- 1 10 Reassessment of Co-morbidities (includes updates in patient status) X- 1 5 Reassessment of Adherence to Treatment  Plan ASSESSMENTS - Wound and Skin A ssessment / Reassessment X - Simple Wound Assessment / Reassessment - one wound 1 5 [name], [name] ([id number]) [id number] Page 2 of 9 []  - 0 Complex Wound Assessment / Reassessment - multiple wounds []  - 0 Dermatologic / Skin Assessment (not related to wound area) ASSESSMENTS - Focused Assessment []  - 0 Circumferential Edema Measurements - multi extremities []  - 0 Nutritional Assessment / Counseling / Intervention X- 1 5 Lower Extremity Assessment (monofilament, tuning fork, pulses) X- 1 10 Peripheral Arterial Disease Assessment (using hand held doppler) ASSESSMENTS - Ostomy and/or Continence Assessment and Care []  - 0 Incontinence Assessment and Management []  - 0 Ostomy Care Assessment and Management (repouching, etc.) PROCESS - Coordination of Care X - Simple Patient / Family Education for ongoing care 1 15 []  - 0 Complex (extensive) Patient / Family Education for ongoing care X- 1 10 Staff obtains Chiropractor, Records, T Results / Process Orders est []  - 0 Staff telephones HHA, Nursing Homes / Clarify orders / etc []  - 0 Routine Transfer to another Facility (non-emergent condition) []  - 0 Routine Hospital Admission (non-emergent condition) []  - 0 New Admissions / Manufacturing engineer / Ordering NPWT Apligraf, etc. , []  - 0 Emergency Hospital Admission (emergent condition) []  - 0 Simple Discharge Coordination []  - 0 Complex (extensive) Discharge Coordination PROCESS - Special Needs []  - 0 Pediatric / Minor Patient Management []  - 0 Isolation Patient Management []  - 0 Hearing / Language / Visual special needs []  - 0 Assessment of Community assistance (transportation, D/C planning, etc.) []  - 0 Additional assistance / Altered mentation []  - 0 Support Surface(s) Assessment (bed, cushion, seat, etc.) INTERVENTIONS - Wound Cleansing / Measurement X - Simple Wound Cleansing - one wound 1 5 []  -  0 Complex Wound Cleansing - multiple wounds X- 1 5 Wound Imaging (photographs - any number of wounds) []  -  0 Wound Tracing (instead of photographs) X- 1 5 Simple Wound Measurement - one wound []  - 0 Complex Wound Measurement - multiple wounds INTERVENTIONS - Wound Dressings X - Small Wound Dressing one or multiple wounds 1 10 []  - 0 Medium Wound Dressing one or multiple wounds []  - 0 Large Wound Dressing one or multiple wounds []  - 0 Application of Medications - topical []  - 0 Application of Medications - injection INTERVENTIONS - Miscellaneous []  - 0 External ear exam []  - 0 Specimen Collection (cultures, biopsies, blood, body fluids, etc.) []  - 0 Specimen(s) / Culture(s) sent or taken to Lab for analysis [name], [name] ([id number]) [id number] Page 3 of 9 []  - 0 Patient Transfer (multiple staff / Nurse, adult / Similar devices) []  - 0 Simple Staple / Suture removal (25 or less) []  - 0 Complex Staple / Suture removal (26 or more) []  - 0 Hypo / Hyperglycemic Management (close monitor of Blood Glucose) []  - 0 Ankle / Brachial Index (ABI) - do not check if billed separately X- 1 5 Vital Signs Has the patient been seen at the hospital within the last three years: Yes Total Score: 90 Level Of Care: New/Established - Level 3 Electronic Signature(s) Signed: 04/25/2023 1:41:07 PM By: Bonnell Public Entered By: Bonnell Public on 04/25/2023 08:02:08 -------------------------------------------------------------------------------- Encounter Discharge Information Details Patient Name: Date of Service: [name] 04/25/2023 7:30 A [name] Medical Record Number: [id number] Patient Account Number: [id number] Date of Birth/Sex: Treating RN: Nov 02, 1934 (88 y.o. [name], [name] Primary Care [name]: [name] Other Clinician: Referring [name]: Treating [name]/Extender: [name] in Treatment: 11 Encounter Discharge  Information Items Discharge Condition: Stable Ambulatory Status: Cane Discharge Destination: Home Transportation: Private Auto Accompanied By: spouse Schedule Follow-up Appointment: Yes Clinical Summary of Care: Electronic Signature(s) Signed: 04/25/2023 1:41:07 PM By: Bonnell Public Entered By: Bonnell Public on 04/25/2023 08:03:53 -------------------------------------------------------------------------------- Lower Extremity Assessment Details Patient Name: Date of Service: [name], [name] 04/25/2023 7:30 A [name] Medical Record Number: [id number] Patient Account Number: [id number] Date of Birth/Sex: Treating RN: [birth year] (88 y.o. [name], [name] Primary Care Joeseph Verville: [name] Other Clinician: Merri Ray ([id number]) [id number] Page 4 of 9 Referring [place] Zou: Treating Jonna Dittrich/Extender: [name] in Treatment: 11 Edema Assessment Assessed: [Left: No] [Right: No] Edema: [Left: N] [Right: o] Vascular Assessment Extremity colors, hair growth, and conditions: Extremity Color: [Right:Hyperpigmented] Hair Growth on Extremity: [Right:No] Temperature of Extremity: [Right:Cool] Capillary Refill: [Right:< 3 seconds] Dependent Rubor: [Right:No] Blanched when Elevated: [Right:No No] Toe Nail Assessment Left: Right: Thick: Yes Discolored: Yes Deformed: No Improper Length and Hygiene: No Electronic Signature(s) Signed: 04/25/2023 1:41:07 PM By: Bonnell Public Entered By: Bonnell Public on 04/25/2023 07:46:55 -------------------------------------------------------------------------------- Multi Wound Chart Details Patient Name: Date of Service: [name] 04/25/2023 7:30 A [name] Medical Record Number: [id number] Patient Account Number: [id number] Date of Birth/Sex: Treating RN: 05/15/1935 (88 y.o. [name], [name] Primary Care Dayana Dalporto: [name] Other Clinician: Referring Negar Sieler: Treating Javed Cotto/Extender: [name] in Treatment: 11 Vital Signs Height(in): 61 Pulse(bpm): 84 Weight(lbs): 108 Blood Pressure(mmHg): 134/67 Body Mass Index(BMI): 20.4 Temperature(F): 97.6 Respiratory Rate(breaths/min): 18 [1:Photos:] [N/A:N/A] Right, Lateral Lower Leg N/A N/A Wound Location: Skin Tear/Laceration N/A N/A Wounding Event: BENILDE, MORELL ([id number]) [id number] Page 5 of 9 Skin T ear N/A N/A Primary Etiology: Chronic Obstructive Pulmonary N/A N/A Comorbid History: Disease (COPD), Arrhythmia, Congestive Heart Failure, Coronary Artery Disease, Hypertension, Received Radiation 10/24/2022 N/A N/A Date Acquired: 42  N/A N/A Weeks of Treatment: Open N/A N/A Wound Status: No N/A N/A Wound Recurrence: 2x0.5x0.1 N/A N/A Measurements L x W x D (cm) 0.785 N/A N/A A (cm) : rea 0.079 N/A N/A Volume (cm) : 83.20% N/A N/A % Reduction in Area: 83.10% N/A N/A % Reduction in Volume: Full Thickness Without Exposed N/A N/A Classification: Support Structures Medium N/A N/A Exudate Amount: Serosanguineous N/A N/A Exudate Type: red, brown N/A N/A Exudate Color: Distinct, outline attached N/A N/A Wound Margin: Large (67-100%) N/A N/A Granulation Amount: Red, Pink N/A N/A Granulation Quality: None Present (0%) N/A N/A Necrotic Amount: Fat Layer (Subcutaneous Tissue): Yes N/A N/A Exposed Structures: Fascia: No Tendon: No Muscle: No Joint: No Bone: No Medium (34-66%) N/A N/A Epithelialization: Treatment Notes Electronic Signature(s) Signed: 04/25/2023 1:41:07 PM By: Bonnell Public Entered By: Bonnell Public on 04/25/2023 07:48:07 -------------------------------------------------------------------------------- Multi-Disciplinary Care Plan Details Patient Name: Date of Service: [name] 04/25/2023 7:30 A [name] Medical Record Number: [id number] Patient Account Number: [id number] Date of Birth/Sex: Treating RN: [birth year] (88 y.o. [name],  [name] Primary Care Brayson Livesey: [name] Other Clinician: Referring Redmond Whittley: Treating Ziomara Birenbaum/Extender: [name] in Treatment: 11 Active Inactive Wound/Skin Impairment Nursing Diagnoses: Impaired tissue integrity Knowledge deficit related to ulceration/compromised skin integrity Goals: Ulcer/skin breakdown will have a volume reduction of 30% by week 4 Date Initiated: 02/04/2023 Date Inactivated: 03/04/2023 Target Resolution Date: 03/04/2023 Goal Status: Unmet Unmet Reason: comorbidities Ulcer/skin breakdown will have a volume reduction of 50% by week 8 Date Initiated: 02/04/2023 Date Inactivated: 04/15/2023 Target Resolution Date: 04/01/2023 Goal Status: Met [name], [name] ([id number]) [id number] Page 6 of 9 Ulcer/skin breakdown will have a volume reduction of 80% by week 12 Date Initiated: 02/04/2023 Target Resolution Date: 04/29/2023 Goal Status: Active Ulcer/skin breakdown will heal within 14 weeks Date Initiated: 02/04/2023 Target Resolution Date: 05/13/2023 Goal Status: Active Interventions: Assess patient/caregiver ability to obtain necessary supplies Assess patient/caregiver ability to perform ulcer/skin care regimen upon admission and as needed Assess ulceration(s) every visit Provide education on ulcer and skin care Treatment Activities: Skin care regimen initiated : 02/04/2023 Notes: Electronic Signature(s) Signed: 04/25/2023 1:41:07 PM By: Bonnell Public Entered By: Bonnell Public on 04/25/2023 08:03:02 -------------------------------------------------------------------------------- Pain Assessment Details Patient Name: Date of Service: [name], [name] 04/25/2023 7:30 A [name] Medical Record Number: [id number] Patient Account Number: [id number] Date of Birth/Sex: Treating RN: 1935-03-14 (88 y.o. [name], [name] Primary Care Lathon Adan: [name] Other Clinician: Referring Pema Thomure: Treating Silvia Hightower/Extender: [name] in Treatment: 11 Active Problems Location of Pain Severity and Description of Pain Patient Has Paino No Site Locations Pain Management and Medication Current Pain Management: Electronic Signature(s) Signed: 04/25/2023 1:41:07 PM By: Barnabas Lister ([id number]) [id number] Page 7 of 9 Entered By: Bonnell Public on 04/25/2023 07:39:36 -------------------------------------------------------------------------------- Patient/Caregiver Education Details Patient Name: Date of Service: [name], [name] 8/16/2024andnbsp7:30 A [name] Medical Record Number: [id number] Patient Account Number: [id number] Date of Birth/Gender: Treating RN: Dec 23, 1934 (88 y.o. [name], [name] Primary Care Physician: [name] Other Clinician: Referring Physician: Treating Physician/Extender: [name] in Treatment: 11 Education Assessment Education Provided To: Patient Education Topics Provided Wound/Skin Impairment: Handouts: Caring for Your Ulcer Methods: Explain/Verbal Responses: State content correctly Electronic Signature(s) Signed: 04/25/2023 1:41:07 PM By: Bonnell Public Entered By: Bonnell Public on 04/25/2023 08:03:14 -------------------------------------------------------------------------------- Wound Assessment Details Patient Name: Date of Service: [name], [name] 04/25/2023 7:30 A [name] Medical Record Number: [id number] Patient Account Number: [id number] Date of  Birth/Sex: Treating RN: 1935-07-08 (88 y.o. [name], [name] Primary Care Aydin Cavalieri: [name] Other Clinician: Referring Jatniel Verastegui: Treating Aritha Huckeba/Extender: [name] in Treatment: 11 Wound Status Wound Number: 1 Primary Skin T ear Etiology: Wound Location: Right, Lateral Lower Leg Wound Open Wounding Event: Skin Tear/Laceration Status: Date Acquired: 10/24/2022 Comorbid Chronic Obstructive Pulmonary Disease  (COPD), Arrhythmia, Weeks Of Treatment: 11 History: Congestive Heart Failure, Coronary Artery Disease, Hypertension, Clustered Wound: No Received Radiation Photos [name], [name] ([id number]) [id number] Page 8 of 9 Wound Measurements Length: (cm) 2 Width: (cm) 0.5 Depth: (cm) 0.1 Area: (cm) 0.785 Volume: (cm) 0.079 % Reduction in Area: 83.2% % Reduction in Volume: 83.1% Epithelialization: Medium (34-66%) Wound Description Classification: Full Thickness Without Exposed Suppor Wound Margin: Distinct, outline attached Exudate Amount: Medium Exudate Type: Serosanguineous Exudate Color: red, brown t Structures Foul Odor After Cleansing: No Slough/Fibrino Yes Wound Bed Granulation Amount: Large (67-100%) Exposed Structure Granulation Quality: Red, Pink Fascia Exposed: No Necrotic Amount: None Present (0%) Fat Layer (Subcutaneous Tissue) Exposed: Yes Tendon Exposed: No Muscle Exposed: No Joint Exposed: No Bone Exposed: No Treatment Notes Wound #1 (Lower Leg) Wound Laterality: Right, Lateral Cleanser Vashe 5.8 (oz) Discharge Instruction: Use vashe 5.8 (oz) as directed Peri-Wound Care Topical Primary Dressing Silvercel Small 2x2 (in/in) Discharge Instruction: Apply Silvercel Small 2x2 (in/in) as instructed Secondary Dressing Conforming Guaze Roll-Small Discharge Instruction: Apply Conforming Stretch Guaze Bandage as directed Secured With Tubigrip Size C, 2.75x10 (in/yd) Discharge Instruction: Apply 3 Tubigrip C 3-finger-widths below knee to base of toes to secure dressing and/or for swelling. Compression Wrap Compression Stockings Add-Ons Electronic Signature(s) Signed: 04/25/2023 1:41:07 PM By: Bonnell Public Entered By: Bonnell Public on 04/25/2023 07:47:57 Butner, Mardene Sayer ([id number]) [id number] Page 9 of 9 -------------------------------------------------------------------------------- Vitals Details Patient Name:  Date of Service: MAKALYA, AMPARO [name]. 04/25/2023 7:30 A [name] Medical Record Number: [id number] Patient Account Number: [id number] Date of Birth/Sex: Treating RN: Mar 10, 1935 (88 y.o. [name], [name] Primary Care Lissette Schenk: [name] Other Clinician: Referring Mica Ramdass: Treating Magdaleno Lortie/Extender: [name] in Treatment: 11 Vital Signs Time Taken: 07:36 Temperature (F): 97.6 Height (in): 61 Pulse (bpm): 84 Weight (lbs): 108 Respiratory Rate (breaths/min): 18 Body Mass Index (BMI): 20.4 Blood Pressure (mmHg): 134/67 Reference Range: 80 - 120 mg / dl Electronic Signature(s) Signed: 04/25/2023 1:41:07 PM By: Bonnell Public Entered By: Bonnell Public on 04/25/2023 07:39:06

## 2023-05-02 ENCOUNTER — Encounter: Payer: Medicare Other | Admitting: Physician Assistant

## 2023-05-02 DIAGNOSIS — I87331 Chronic venous hypertension (idiopathic) with ulcer and inflammation of right lower extremity: Secondary | ICD-10-CM | POA: Diagnosis not present

## 2023-05-02 NOTE — Progress Notes (Signed)
[name] ([id number]) [id number] Page 1 of 8 Visit Report for 05/02/2023 Arrival Information Details Patient Name: Date of Service: [name], [name] 05/02/2023 12:00 PM Medical Record Number: [id number] Patient Account Number: [id number] Date of Birth/Sex: Treating RN: 08-May-1935 (88 y.o. [name] Primary Care [name]: Bari Edward Other Clinician: Betha [name] Referring [name]: Treating [name]/Extender: [name] in Treatment: 12 Visit Information History Since Last Visit All ordered tests and consults were completed: No Patient Arrived: Ambulatory Added or deleted any medications: No Arrival Time: 11:54 Any new allergies or adverse reactions: No Transfer Assistance: None Had a fall or experienced change in No Patient Identification Verified: Yes activities of daily living that may affect Secondary Verification Process Completed: Yes risk of falls: Patient Has Alerts: Yes Signs or symptoms of abuse/neglect since last visito No Patient Alerts: Patient on Blood Thinner Hospitalized since last visit: No PACEMAKER Implantable device outside of the clinic excluding No 02/13/23 ABI [name] cellular tissue based products placed in the center 02/13/23 ABI L 1.26 since last visit: 02/13/23 TBI [name] 0.91 02/13/23 TBI L 0.86 Has Dressing in Place as Prescribed: Yes Has Compression in Place as Prescribed: Yes Pain Present Now: No Electronic Signature(s) Signed: 05/02/2023 1:51:46 PM By: [name] Entered By: [name] on 05/02/2023 09:03:45 -------------------------------------------------------------------------------- Clinic Level of Care Assessment Details Patient Name: Date of Service: [name], [name] 05/02/2023 12:00 PM Medical Record Number: [id number] Patient Account Number: [id number] Date of Birth/Sex: Treating RN: 01/17/1935 (88 y.o. [name] Primary Care Daelan Gatt: Bari Edward Other  Clinician: Betha [name] Referring [name]: Treating [name]/Extender: [name] in Treatment: 12 Clinic Level of Care Assessment Items TOOL 4 Quantity Score []  - 0 Use when only an EandM is performed on FOLLOW-UP visit ASSESSMENTS - Nursing Assessment / Reassessment X- 1 10 Reassessment of Co-morbidities (includes updates in patient status) [name] ([id number]) [id number] Page 2 of 8 X- 1 5 Reassessment of Adherence to Treatment Plan ASSESSMENTS - Wound and Skin A ssessment / Reassessment X - Simple Wound Assessment / Reassessment - one wound 1 5 []  - 0 Complex Wound Assessment / Reassessment - multiple wounds []  - 0 Dermatologic / Skin Assessment (not related to wound area) ASSESSMENTS - Focused Assessment []  - 0 Circumferential Edema Measurements - multi extremities []  - 0 Nutritional Assessment / Counseling / Intervention []  - 0 Lower Extremity Assessment (monofilament, tuning fork, pulses) []  - 0 Peripheral Arterial Disease Assessment (using hand held doppler) ASSESSMENTS - Ostomy and/or Continence Assessment and Care []  - 0 Incontinence Assessment and Management []  - 0 Ostomy Care Assessment and Management (repouching, etc.) PROCESS - Coordination of Care X - Simple Patient / Family Education for ongoing care 1 15 []  - 0 Complex (extensive) Patient / Family Education for ongoing care []  - 0 Staff obtains Chiropractor, Records, T Results / Process Orders est []  - 0 Staff telephones HHA, Nursing Homes / Clarify orders / etc []  - 0 Routine Transfer to another Facility (non-emergent condition) []  - 0 Routine Hospital Admission (non-emergent condition) []  - 0 New Admissions / Manufacturing engineer / Ordering NPWT Apligraf, etc. , []  - 0 Emergency Hospital Admission (emergent condition) X- 1 10 Simple Discharge Coordination []  - 0 Complex (extensive) Discharge Coordination PROCESS - Special Needs []  -  0 Pediatric / Minor Patient Management []  - 0 Isolation Patient Management []  - 0 Hearing / Language / Visual special needs []  - 0 Assessment of Community assistance (transportation,  D/C planning, etc.) []  - 0 Additional assistance / Altered mentation []  - 0 Support Surface(s) Assessment (bed, cushion, seat, etc.) INTERVENTIONS - Wound Cleansing / Measurement X - Simple Wound Cleansing - one wound 1 5 []  - 0 Complex Wound Cleansing - multiple wounds X- 1 5 Wound Imaging (photographs - any number of wounds) []  - 0 Wound Tracing (instead of photographs) []  - 0 Simple Wound Measurement - one wound []  - 0 Complex Wound Measurement - multiple wounds INTERVENTIONS - Wound Dressings X - Small Wound Dressing one or multiple wounds 1 10 []  - 0 Medium Wound Dressing one or multiple wounds []  - 0 Large Wound Dressing one or multiple wounds X- 1 5 Application of Medications - topical []  - 0 Application of Medications - injection INTERVENTIONS - Miscellaneous [name], [name] ([id number]) [id number] Page 3 of 8 []  - 0 External ear exam []  - 0 Specimen Collection (cultures, biopsies, blood, body fluids, etc.) []  - 0 Specimen(s) / Culture(s) sent or taken to Lab for analysis []  - 0 Patient Transfer (multiple staff / [name] Lift / Similar devices) []  - 0 Simple Staple / Suture removal (25 or less) []  - 0 Complex Staple / Suture removal (26 or more) []  - 0 Hypo / Hyperglycemic Management (close monitor of Blood Glucose) []  - 0 Ankle / Brachial Index (ABI) - do not check if billed separately X- 1 5 Vital Signs Has the patient been seen at the hospital within the last three years: Yes Total Score: 75 Level Of Care: New/Established - Level 2 Electronic Signature(s) Signed: 05/02/2023 1:51:46 PM By: [name] Entered By: [name] on 05/02/2023 09:46:00 -------------------------------------------------------------------------------- Encounter  Discharge Information Details Patient Name: Date of Service: [name] 05/02/2023 12:00 PM Medical Record Number: [id number] Patient Account Number: [id number] Date of Birth/Sex: Treating RN: 09-03-1935 (88 y.o. [name] Primary Care Glenden Rossell: Bari Edward Other Clinician: Betha [name] Referring Wilfred Siverson: Treating Mariana Goytia/Extender: [name] in Treatment: 12 Encounter Discharge Information Items Discharge Condition: Stable Ambulatory Status: Ambulatory Discharge Destination: Home Transportation: Private Auto Accompanied By: husband Schedule Follow-up Appointment: No Clinical Summary of Care: Electronic Signature(s) Signed: 05/02/2023 1:51:46 PM By: [name] Entered By: [name] on 05/02/2023 09:56:56 Espin, Cariah [name] ([id number]) [id number] Page 4 of 8 -------------------------------------------------------------------------------- Lower Extremity Assessment Details Patient Name: Date of Service: [name], [name] 05/02/2023 12:00 PM Medical Record Number: [id number] Patient Account Number: [id number] Date of Birth/Sex: Treating RN: 09/25/1934 (88 y.o. [name] Primary Care Makenleigh Crownover: Bari Edward Other Clinician: Betha [name] Referring Lakoda Mcanany: Treating Rakisha Pincock/Extender: [name] in Treatment: 12 Electronic Signature(s) Signed: 05/02/2023 1:51:46 PM By: [name] Signed: 05/02/2023 1:54:26 PM By: Angelina Pih Entered By: [name] on 05/02/2023 09:13:31 -------------------------------------------------------------------------------- Multi Wound Chart Details Patient Name: Date of Service: [name] 05/02/2023 12:00 PM Medical Record Number: [id number] Patient Account Number: [id number] Date of Birth/Sex: Treating RN: 03-27-1935 (88 y.o. [name] Primary Care Gregor Dershem: Bari Edward Other Clinician: Betha [name] Referring  Laretha Luepke: Treating Leann Mayweather/Extender: [name] in Treatment: 12 Vital Signs Height(in): 61 Pulse(bpm): 79 Weight(lbs): 108 Blood Pressure(mmHg): 112/64 Body Mass Index(BMI): 20.4 Temperature(F): 97.7 Respiratory Rate(breaths/min): 18 [1:Photos:] [N/A:N/A] Right, Lateral Lower Leg N/A N/A Wound Location: Skin Tear/Laceration N/A N/A Wounding Event: Skin Tear N/A N/A Primary Etiology: Chronic Obstructive Pulmonary N/A N/A Comorbid History: Disease (COPD), Arrhythmia, Congestive Heart Failure, Coronary Artery Disease, Hypertension, Received Radiation 10/24/2022 N/A N/A Date Acquired: 12 N/A N/A  Weeks of Treatment: Healed - Epithelialized N/A N/A Wound Status: No N/A N/A Wound Recurrence: 0x0x0 N/A N/A Measurements L x W x D (cm) 0 N/A N/A A (cm) : rea 0 N/A N/A Volume (cm) : 100.00% N/A N/A % Reduction in Area: 100.00% N/A N/A % Reduction in Volume: Full Thickness Without Exposed N/A N/A Classification: Support Structures None Present N/A N/A Exudate Amount: Distinct, outline attached N/A N/A Wound MarginCHRISTOPHER, [name] ([id number]) [id number] Page 5 of 8 None Present (0%) N/A N/A Granulation Amount: None Present (0%) N/A N/A Necrotic Amount: Fat Layer (Subcutaneous Tissue): Yes N/A N/A Exposed Structures: Fascia: No Tendon: No Muscle: No Joint: No Bone: No Large (67-100%) N/A N/A Epithelialization: Treatment Notes Electronic Signature(s) Signed: 05/02/2023 1:51:46 PM By: [name] Entered By: [name] on 05/02/2023 09:43:33 -------------------------------------------------------------------------------- Multi-Disciplinary Care Plan Details Patient Name: Date of Service: [name] 05/02/2023 12:00 PM Medical Record Number: [id number] Patient Account Number: [id number] Date of Birth/Sex: Treating RN: 09-27-1934 (88 y.o. [name] Primary Care Iylah Dworkin: Bari Edward Other Clinician: Betha [name] Referring Joua Bake: Treating Asiyah Pineau/Extender: [name] in Treatment: 12 Active Inactive Electronic Signature(s) Signed: 05/02/2023 1:51:46 PM By: [name] Signed: 05/02/2023 1:54:26 PM By: Angelina Pih Entered By: [name] on 05/02/2023 09:49:17 -------------------------------------------------------------------------------- Pain Assessment Details Patient Name: Date of Service: [name] 05/02/2023 12:00 PM Medical Record Number: [id number] Patient Account Number: [id number] Date of Birth/Sex: Treating RN: Mar 09, 1935 (88 y.o. [name] Primary Care Jed Kutch: Bari Edward Other Clinician: Betha [name] Referring Henley Blyth: Treating Shloima Clinch/Extender: [name] in Treatment: 12 Active Problems Location of Pain Severity and Description of Pain Patient Has Paino No [name] ([id number]) [id number] Page 6 of 8 Patient Has Paino No Site Locations Pain Management and Medication Current Pain Management: Electronic Signature(s) Signed: 05/02/2023 1:51:46 PM By: [name] Signed: 05/02/2023 1:54:26 PM By: Angelina Pih Entered By: [name] on 05/02/2023 09:12:21 -------------------------------------------------------------------------------- Patient/Caregiver Education Details Patient Name: Date of Service: [name] 8/23/2024andnbsp12:00 PM Medical Record Number: [id number] Patient Account Number: [id number] Date of Birth/Gender: Treating RN: 01-09-1935 (88 y.o. [name] Primary Care Physician: Bari Edward Other Clinician: Betha [name] Referring Physician: Treating Physician/Extender: [name] in Treatment: 12 Education Assessment Education Provided To: Patient Education Topics Provided Wound/Skin Impairment: Handouts: Other: your wound has healed. Please call if any  further issues arise Methods: Explain/Verbal Responses: State content correctly Electronic Signature(s) Signed: 05/02/2023 1:51:46 PM By: [name] Entered By: [name] on 05/02/2023 09:56:22 Kader, Lala [name] ([id number]) [id number] Page 7 of 8 -------------------------------------------------------------------------------- Wound Assessment Details Patient Name: Date of Service: [name], [name] 05/02/2023 12:00 PM Medical Record Number: [id number] Patient Account Number: [id number] Date of Birth/Sex: Treating RN: 02/11/1935 (88 y.o. [name] Primary Care Kimbly Eanes: Bari Edward Other Clinician: Betha [name] Referring Danie Diehl: Treating Kamaryn Grimley/Extender: [name] in Treatment: 12 Wound Status Wound Number: 1 Primary Skin T ear Etiology: Wound Location: Right, Lateral Lower Leg Wound Healed - Epithelialized Wounding Event: Skin Tear/Laceration Status: Date Acquired: 10/24/2022 Comorbid Chronic Obstructive Pulmonary Disease (COPD), Arrhythmia, Weeks Of Treatment: 12 History: Congestive Heart Failure, Coronary Artery Disease, Hypertension, Clustered Wound: No Received Radiation Photos Wound Measurements Length: (cm) Width: (cm) Depth: (cm) Area: (cm) Volume: (cm) 0 % Reduction in Area: 100% 0 % Reduction in Volume: 100% 0 Epithelialization: Large (67-100%) 0 0 Wound Description Classification: Full Thickness Without Exposed Support Wound Margin: Distinct, outline attached  Exudate Amount: None Present Structures Foul Odor After Cleansing: No Slough/Fibrino No Wound Bed Granulation Amount: None Present (0%) Exposed Structure Necrotic Amount: None Present (0%) Fascia Exposed: No Fat Layer (Subcutaneous Tissue) Exposed: Yes Tendon Exposed: No Muscle Exposed: No Joint Exposed: No Bone Exposed: No Treatment Notes Wound #1 (Lower Leg) Wound Laterality: Right, Lateral Cleanser Peri-Wound  Care Topical [name], [name] ([id number]) [id number] Page 8 of 8 Primary Dressing Secondary Dressing Secured With Compression Wrap Compression Stockings Add-Ons Electronic Signature(s) Signed: 05/02/2023 1:51:46 PM By: [name] Signed: 05/02/2023 1:54:26 PM By: Angelina Pih Entered By: [name] on 05/02/2023 09:43:19 -------------------------------------------------------------------------------- Vitals Details Patient Name: Date of Service: [name] 05/02/2023 12:00 PM Medical Record Number: [id number] Patient Account Number: [id number] Date of Birth/Sex: Treating RN: [birth year] (88 y.o. [name] Primary Care Domino Holten: Bari Edward Other Clinician: Betha [name] Referring Coleta Grosshans: Treating Mead Slane/Extender: [name] in Treatment: 12 Vital Signs Time Taken: 12:03 Temperature (F): 97.7 Height (in): 61 Pulse (bpm): 79 Weight (lbs): 108 Respiratory Rate (breaths/min): 18 Body Mass Index (BMI): 20.4 Blood Pressure (mmHg): 112/64 Reference Range: 80 - 120 mg / dl Electronic Signature(s) Signed: 05/02/2023 1:51:46 PM By: [name] Entered By: [name] on 05/02/2023 09:07:38

## 2023-05-02 NOTE — Progress Notes (Addendum)
[name], [place] [name] ([id number]) [id number] Page 1 of 7 Visit Report for 05/02/2023 Chief Complaint Document Details Patient Name: Date of Service: Jamie [name] 05/02/2023 12:00 PM Medical Record Number: [id number] Patient Account Number: [id number] Date of Birth/Sex: Treating RN: 1935/09/08 (88 y.o. Jamie [name] Primary Care Provider: Bari Edward Other Clinician: Betha Loa Referring Provider: Treating Provider/Extender: Francee Piccolo in Treatment: 12 Information Obtained from: Patient Chief Complaint Right LE Ulcer Electronic Signature(s) Signed: 05/02/2023 12:24:54 PM By: Allen Derry PA-C Entered By: Allen Derry on 05/02/2023 09:24:54 -------------------------------------------------------------------------------- HPI Details Patient Name: Date of Service: Jamie [name] RA [name] 05/02/2023 12:00 PM Medical Record Number: [id number] Patient Account Number: [id number] Date of Birth/Sex: Treating RN: [birth year] (88 y.o. Jamie [name] Primary Care Provider: Bari Edward Other Clinician: Betha Loa Referring Provider: Treating Provider/Extender: Francee Piccolo in Treatment: 12 History of Present Illness HPI Description: 02-04-2023 patient presents today for initial evaluation here in our clinic concerning a wound which actually began around October 24, 2022. With that being said she notes that this was due to an injury and then has continued to be a problem not wanting to close and heal as quickly as would be expected. She is on long-term anticoagulant therapy due to atrial fibrillation. She also has hypertension, COPD, coronary artery disease, congestive heart failure, and chronic venous insufficiency bilaterally. With that being said I do believe that this wound is currently showing some signs of improvement I do not see any obvious evidence of infection right now though that something we will keep a  close eye on she has been on Ceftin this was December 18, 2022. She is not on that currently. Again she is on Coumadin. 02-11-2023 upon evaluation today patient appears to be doing decently well currently in regard to her leg wound. She is still is having some buildup of slough and necrotic debris at this time. With that being said I do think that overall for 1 week's time she has done decently well. 02-18-2023 upon evaluation today patient appears to be doing well currently in regard to her wound although there is slough and biofilm buildup she has continued to have issues here with is slowly making progress towards healing. Fortunately I do not see any signs of active infection locally nor systemically at this time. No fevers, chills, nausea, vomiting, or diarrhea. 02-25-2023 upon evaluation patient appears to be doing pretty well currently in regard to her wound which is showing signs of improvement slowly but surely. Fortunately I do not see any evidence of active infection locally nor systemically which is great news. No fevers, chills, nausea, vomiting, or diarrhea. Jamie [name], Jamie [name] ([id number]) [id number] Page 2 of 7 03-04-2023 upon evaluation patient's wound is doing well. I do think that she is ready for application of the Apligraf today without improvement regarding on hand. I would hope that we can get this moving in the right direction hopefully quickly with the use of the Apligraf. 03-11-2023 upon evaluation today patient appears to be doing well currently in regard to her wound. This is actually measuring smaller and looking much better. Fortunately I do not see any signs of active infection locally or systemically which is great news and in general I do think that we are making good headway towards complete closure. 03-20-2023 upon evaluation today patient's wound actually is showing signs of improvement based on what we are seeing at this point. I do believe that she  is making good headway  towards getting this closed. I believe the Apligraf has been beneficial for her at this time and I am very pleased in that regard. The wound looks significantly improved both in the overall appearance as well as the overall size and I am extremely happy at this time. I do believe that we should continue with the Apligraf as such as I do feel this is really doing quite well. 7/23; traumatic wound in the setting of chronic venous insufficiency which was nonhealing. We applied Apligraf #4 today. Making nice improvements Heat-6-24 upon evaluation patient appears to be doing well currently in regard to her wound. In fact this is very close to complete closure. I do believe that she is making excellent headway and this will likely be her last Apligraf applied. 04-25-2023 upon evaluation today patient appears to be doing excellent in regard to her wound which is actually showing signs of great improvement. Fortunately I do not see any evidence of active infection locally or systemically which is great news and in general I do believe she is making excellent headway towards complete closure. 05-02-2023 upon evaluation today patient appears to be doing well currently in regard to her wound. She has been tolerating the dressing changes without complication. Fortunately she actually appears to be healed based on what I am seeing. Electronic Signature(s) Signed: 05/02/2023 1:18:40 PM By: Allen Derry PA-C Entered By: Allen Derry on 05/02/2023 10:18:40 -------------------------------------------------------------------------------- Physical Exam Details Patient Name: Date of Service: Jamie [name], Jamie RA [name] 05/02/2023 12:00 PM Medical Record Number: [id number] Patient Account Number: [id number] Date of Birth/Sex: Treating RN: May 30, 1935 (88 y.o. Jamie [name] Primary Care Provider: Bari Edward Other Clinician: Betha Loa Referring Provider: Treating Provider/Extender: Francee Piccolo in Treatment: 12 Constitutional Well-nourished and well-hydrated in no acute distress. Respiratory normal breathing without difficulty. Psychiatric this patient is able to make decisions and demonstrates good insight into disease process. Alert and Oriented x 3. pleasant and cooperative. Notes Upon inspection patient's wound bed actually showed signs of good granulation and epithelization at this point. Fortunately I do not see any evidence of worsening overall I do believe the patient is Healed based on what I see today. Electronic Signature(s) Signed: 05/02/2023 1:19:02 PM By: Allen Derry PA-C Entered By: Allen Derry on 05/02/2023 10:19:02 Jamie [name] ([id number]) [id number] Page 3 of 7 -------------------------------------------------------------------------------- Physician Orders Details Patient Name: Date of Service: Jamie [name], Jamie RA [name] 05/02/2023 12:00 PM Medical Record Number: [id number] Patient Account Number: [id number] Date of Birth/Sex: Treating RN: 09-27-1934 (88 y.o. Jamie [name] Primary Care Provider: Bari Edward Other Clinician: Betha Loa Referring Provider: Treating Provider/Extender: Francee Piccolo in Treatment: 12 Verbal / Phone Orders: Yes Clinician: Angelina Pih Read Back and Verified: Yes Diagnosis Coding ICD-10 Coding Code Description [phone] Laceration without foreign body, right lower leg, initial encounter I87.331 Chronic venous hypertension (idiopathic) with ulcer and inflammation of right lower extremity L97.812 Non-pressure chronic ulcer of other part of right lower leg with fat layer exposed I48.0 Paroxysmal atrial fibrillation Z79.01 Long term (current) use of anticoagulants J44.9 Chronic obstructive pulmonary disease, unspecified I10 Essential (primary) hypertension I25.10 Atherosclerotic heart disease of native coronary artery without angina  pectoris I50.42 Chronic combined systolic (congestive) and diastolic (congestive) heart failure Discharge From [facility] Services Discharge from Wound Care Center Treatment Complete Additional Orders / Instructions Other: - conintuue wearin Tubi grip and use AandD ointment x 1 week Electronic Signature(s) Signed: 05/02/2023 1:51:46 PM By: Betha Loa Signed: 05/02/2023 1:57:59 PM By: Larina Bras,  Charlie Char PA-C Entered By: Betha Loa on 05/02/2023 09:45:17 -------------------------------------------------------------------------------- Problem List Details Patient Name: Date of Service: Jamie [name], RUDGE. 05/02/2023 12:00 PM Medical Record Number: [id number] Patient Account Number: [id number] Date of Birth/Sex: Treating RN: [birth year] (88 y.o. Jamie [name] Primary Care Provider: Bari Edward Other Clinician: Betha Loa Referring Provider: Treating Provider/Extender: Francee Piccolo in Treatment: [address] [place], [place] [name] ([id number]) [id number] Page 4 of 7 ICD-10 Encounter Code Description Active Date MDM Diagnosis S81.811A Laceration without foreign body, right lower leg, initial encounter 02/04/2023 No Yes I87.331 Chronic venous hypertension (idiopathic) with ulcer and inflammation of right 02/04/2023 No Yes lower extremity L97.812 Non-pressure chronic ulcer of other part of right lower leg with fat layer 02/04/2023 No Yes exposed I48.0 Paroxysmal atrial fibrillation 02/04/2023 No Yes Z79.01 Long term (current) use of anticoagulants 02/04/2023 No Yes J44.9 Chronic obstructive pulmonary disease, unspecified 02/04/2023 No Yes I10 Essential (primary) hypertension 02/04/2023 No Yes I25.10 Atherosclerotic heart disease of native coronary artery without angina pectoris 02/04/2023 No Yes I50.42 Chronic combined systolic (congestive) and diastolic (congestive) heart failure 02/04/2023 No Yes Inactive Problems Resolved Problems Electronic  Signature(s) Signed: 05/02/2023 12:24:51 PM By: Allen Derry PA-C Entered By: Allen Derry on 05/02/2023 09:24:51 -------------------------------------------------------------------------------- Progress Note Details Patient Name: Date of Service: Jamie [name] RA [name] 05/02/2023 12:00 PM Medical Record Number: [id number] Patient Account Number: [id number] Date of Birth/Sex: Treating RN: [birth year] (88 y.o. Jamie [name] Primary Care Provider: Bari Edward Other Clinician: Betha Loa Referring Provider: Treating Provider/Extender: Francee Piccolo in Treatment: [address], [place] [name] ([id number]) [id number] Page 5 of 7 Chief Complaint Information obtained from Patient Right LE Ulcer History of Present Illness (HPI) 02-04-2023 patient presents today for initial evaluation here in our clinic concerning a wound which actually began around October 24, 2022. With that being said she notes that this was due to an injury and then has continued to be a problem not wanting to close and heal as quickly as would be expected. She is on long-term anticoagulant therapy due to atrial fibrillation. She also has hypertension, COPD, coronary artery disease, congestive heart failure, and chronic venous insufficiency bilaterally. With that being said I do believe that this wound is currently showing some signs of improvement I do not see any obvious evidence of infection right now though that something we will keep a close eye on she has been on Ceftin this was December 18, 2022. She is not on that currently. Again she is on Coumadin. 02-11-2023 upon evaluation today patient appears to be doing decently well currently in regard to her leg wound. She is still is having some buildup of slough and necrotic debris at this time. With that being said I do think that overall for 1 week's time she has done decently well. 02-18-2023 upon evaluation today patient appears  to be doing well currently in regard to her wound although there is slough and biofilm buildup she has continued to have issues here with is slowly making progress towards healing. Fortunately I do not see any signs of active infection locally nor systemically at this time. No fevers, chills, nausea, vomiting, or diarrhea. 02-25-2023 upon evaluation patient appears to be doing pretty well currently in regard to her wound which is showing signs of improvement slowly but surely. Fortunately I do not see any evidence of active infection locally nor systemically which is great news. No fevers, chills, nausea, vomiting, or diarrhea. 03-04-2023 upon evaluation patient's wound is doing  well. I do think that she is ready for application of the Apligraf today without improvement regarding on hand. I would hope that we can get this moving in the right direction hopefully quickly with the use of the Apligraf. 03-11-2023 upon evaluation today patient appears to be doing well currently in regard to her wound. This is actually measuring smaller and looking much better. Fortunately I do not see any signs of active infection locally or systemically which is great news and in general I do think that we are making good headway towards complete closure. 03-20-2023 upon evaluation today patient's wound actually is showing signs of improvement based on what we are seeing at this point. I do believe that she is making good headway towards getting this closed. I believe the Apligraf has been beneficial for her at this time and I am very pleased in that regard. The wound looks significantly improved both in the overall appearance as well as the overall size and I am extremely happy at this time. I do believe that we should continue with the Apligraf as such as I do feel this is really doing quite well. 7/23; traumatic wound in the setting of chronic venous insufficiency which was nonhealing. We applied Apligraf #4 today. Making  nice improvements Heat-6-24 upon evaluation patient appears to be doing well currently in regard to her wound. In fact this is very close to complete closure. I do believe that she is making excellent headway and this will likely be her last Apligraf applied. 04-25-2023 upon evaluation today patient appears to be doing excellent in regard to her wound which is actually showing signs of great improvement. Fortunately I do not see any evidence of active infection locally or systemically which is great news and in general I do believe she is making excellent headway towards complete closure. 05-02-2023 upon evaluation today patient appears to be doing well currently in regard to her wound. She has been tolerating the dressing changes without complication. Fortunately she actually appears to be healed based on what I am seeing. Objective Constitutional Well-nourished and well-hydrated in no acute distress. Vitals Time Taken: 12:03 PM, Height: 61 in, Weight: 108 lbs, BMI: 20.4, Temperature: 97.7 F, Pulse: 79 bpm, Respiratory Rate: 18 breaths/min, Blood Pressure: 112/64 mmHg. Respiratory normal breathing without difficulty. Psychiatric this patient is able to make decisions and demonstrates good insight into disease process. Alert and Oriented x 3. pleasant and cooperative. General Notes: Upon inspection patient's wound bed actually showed signs of good granulation and epithelization at this point. Fortunately I do not see any evidence of worsening overall I do believe the patient is Healed based on what I see today. Integumentary (Hair, Skin) Wound #1 status is Healed - Epithelialized. Original cause of wound was Skin Tear/Laceration. The date acquired was: 10/24/2022. The wound has been in treatment 12 weeks. The wound is located on the Right,Lateral Lower Leg. The wound measures 0cm length x 0cm width x 0cm depth; 0cm^2 area and 0cm^3 volume. There is Fat Layer (Subcutaneous Tissue) exposed. There is  a none present amount of drainage noted. The wound margin is distinct with the outline attached to the wound base. There is no granulation within the wound bed. There is no necrotic tissue within the wound bed. Assessment Active Problems ICD-10 Laceration without foreign body, right lower leg, initial encounter Chronic venous hypertension (idiopathic) with ulcer and inflammation of right lower extremity Jamie [name], Jamie [name] ([id number]) [id number] Page 6 of 7 Non-pressure chronic ulcer of other part  of right lower leg with fat layer exposed Paroxysmal atrial fibrillation Long term (current) use of anticoagulants Chronic obstructive pulmonary disease, unspecified Essential (primary) hypertension Atherosclerotic heart disease of native coronary artery without angina pectoris Chronic combined systolic (congestive) and diastolic (congestive) heart failure Plan Discharge From [facility] Services: Discharge from Wound Care Center Treatment Complete Additional Orders / Instructions: Other: - conintuue wearin Tubi grip and use AandD ointment x 1 week 1. I would recommend that we have the patient continue to monitor for any signs of infection or worsening. Based on what I see I do believe that she has made excellent headway And does appear to be completely closed. 2. I am going to recommend as well she should continue with AandD ointment and Tubigrip for the next week then just AandD ointment following. We will see the patient back for follow-up visit as needed. Electronic Signature(s) Signed: 05/02/2023 1:19:54 PM By: Allen Derry PA-C Entered By: Allen Derry on 05/02/2023 10:19:54 -------------------------------------------------------------------------------- SuperBill Details Patient Name: Date of Service: Jamie [name] RA [name] 05/02/2023 Medical Record Number: [id number] Patient Account Number: [id number] Date of Birth/Sex: Treating RN: 1935-04-01 (88 y.o. Jamie [name] Primary  Care Provider: Bari Edward Other Clinician: Betha Loa Referring Provider: Treating Provider/Extender: Francee Piccolo in Treatment: 12 Diagnosis Coding ICD-10 Codes Code Description [phone] Laceration without foreign body, right lower leg, initial encounter I87.331 Chronic venous hypertension (idiopathic) with ulcer and inflammation of right lower extremity L97.812 Non-pressure chronic ulcer of other part of right lower leg with fat layer exposed I48.0 Paroxysmal atrial fibrillation Z79.01 Long term (current) use of anticoagulants J44.9 Chronic obstructive pulmonary disease, unspecified I10 Essential (primary) hypertension 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 Facility Procedures Physician Procedures : CPT4 Code Description Modifier [id number] - WC PHYS LEVEL 3 - EST PT ICD-10 Diagnosis Description S81.811A Laceration without foreign body, right lower leg, initial encounter I87.331 Chronic venous hypertension (idiopathic) with ulcer and  inflammation of right lower extremity L97.812 Non-pressure chronic ulcer of other part of right lower leg with fat layer exposed I48.0 Paroxysmal atrial fibrillation Quantity: 1 Electronic Signature(s) Signed: 05/02/2023 1:20:07 PM By: Allen Derry PA-C Entered By: Allen Derry on 05/02/2023 10:20:07

## 2023-05-04 ENCOUNTER — Other Ambulatory Visit: Payer: Self-pay | Admitting: Internal Medicine

## 2023-05-04 ENCOUNTER — Other Ambulatory Visit: Payer: Self-pay | Admitting: Cardiovascular Disease

## 2023-05-04 DIAGNOSIS — L03115 Cellulitis of right lower limb: Secondary | ICD-10-CM

## 2023-05-04 DIAGNOSIS — K219 Gastro-esophageal reflux disease without esophagitis: Secondary | ICD-10-CM

## 2023-05-09 ENCOUNTER — Ambulatory Visit: Payer: Medicare Other | Admitting: Internal Medicine

## 2023-05-28 ENCOUNTER — Ambulatory Visit: Payer: Medicare Other | Attending: Cardiovascular Disease

## 2023-05-28 DIAGNOSIS — I340 Nonrheumatic mitral (valve) insufficiency: Secondary | ICD-10-CM | POA: Insufficient documentation

## 2023-05-28 DIAGNOSIS — Z953 Presence of xenogenic heart valve: Secondary | ICD-10-CM | POA: Diagnosis not present

## 2023-05-28 DIAGNOSIS — Z5181 Encounter for therapeutic drug level monitoring: Secondary | ICD-10-CM | POA: Insufficient documentation

## 2023-05-28 LAB — POCT INR: INR: 3.2 — AB (ref 2.0–3.0)

## 2023-05-28 NOTE — Patient Instructions (Signed)
Continue 1 tablet daily EXCEPT 0.5 TABLET ON MONDAYS, WEDNESDAYS, and FRIDAYS; Eat greens tonight - Recheck INR in 6 weeks.

## 2023-06-05 ENCOUNTER — Ambulatory Visit (INDEPENDENT_AMBULATORY_CARE_PROVIDER_SITE_OTHER): Payer: Medicare Other

## 2023-06-05 DIAGNOSIS — I442 Atrioventricular block, complete: Secondary | ICD-10-CM | POA: Diagnosis not present

## 2023-06-06 LAB — CUP PACEART REMOTE DEVICE CHECK
Battery Remaining Longevity: 19 mo
Battery Voltage: 2.91 V
Brady Statistic AP VP Percent: 94.22 %
Brady Statistic AP VS Percent: 0.43 %
Brady Statistic AS VP Percent: 5.32 %
Brady Statistic AS VS Percent: 0.02 %
Brady Statistic RA Percent Paced: 93.82 %
Brady Statistic RV Percent Paced: 99.2 %
Date Time Interrogation Session: 20240926074943
Implantable Lead Connection Status: 753985
Implantable Lead Connection Status: 753985
Implantable Lead Implant Date: 20160823
Implantable Lead Implant Date: 20160823
Implantable Lead Location: 753859
Implantable Lead Location: 753860
Implantable Lead Model: 5076
Implantable Lead Model: 5076
Implantable Pulse Generator Implant Date: 20160823
Lead Channel Impedance Value: 323 Ohm
Lead Channel Impedance Value: 361 Ohm
Lead Channel Impedance Value: 380 Ohm
Lead Channel Impedance Value: 380 Ohm
Lead Channel Pacing Threshold Amplitude: 0.5 V
Lead Channel Pacing Threshold Amplitude: 0.5 V
Lead Channel Pacing Threshold Pulse Width: 0.4 ms
Lead Channel Pacing Threshold Pulse Width: 0.4 ms
Lead Channel Sensing Intrinsic Amplitude: 1.25 mV
Lead Channel Sensing Intrinsic Amplitude: 1.25 mV
Lead Channel Sensing Intrinsic Amplitude: 11.25 mV
Lead Channel Sensing Intrinsic Amplitude: 11.25 mV
Lead Channel Setting Pacing Amplitude: 1.5 V
Lead Channel Setting Pacing Amplitude: 2 V
Lead Channel Setting Pacing Pulse Width: 0.4 ms
Lead Channel Setting Sensing Sensitivity: 2.8 mV
Zone Setting Status: 755011

## 2023-06-10 ENCOUNTER — Encounter: Payer: Self-pay | Admitting: Medical

## 2023-06-10 ENCOUNTER — Ambulatory Visit: Payer: Medicare Other | Attending: Medical | Admitting: Medical

## 2023-06-10 VITALS — BP 122/60 | HR 62 | Ht 61.0 in | Wt 105.4 lb

## 2023-06-10 DIAGNOSIS — I5022 Chronic systolic (congestive) heart failure: Secondary | ICD-10-CM | POA: Diagnosis present

## 2023-06-10 DIAGNOSIS — I4819 Other persistent atrial fibrillation: Secondary | ICD-10-CM | POA: Insufficient documentation

## 2023-06-10 DIAGNOSIS — I340 Nonrheumatic mitral (valve) insufficiency: Secondary | ICD-10-CM | POA: Diagnosis present

## 2023-06-10 DIAGNOSIS — Z9889 Other specified postprocedural states: Secondary | ICD-10-CM | POA: Insufficient documentation

## 2023-06-10 DIAGNOSIS — Z950 Presence of cardiac pacemaker: Secondary | ICD-10-CM | POA: Diagnosis present

## 2023-06-10 DIAGNOSIS — I442 Atrioventricular block, complete: Secondary | ICD-10-CM | POA: Insufficient documentation

## 2023-06-10 NOTE — Patient Instructions (Signed)
Medication Instructions:  Your physician recommends that you continue on your current medications as directed. Please refer to the Current Medication list given to you today.   *If you need a refill on your cardiac medications before your next appointment, please call your pharmacy*   Lab Work: No labs ordered today    Testing/Procedures: Your physician has requested that you have an echocardiogram. Echocardiography is a painless test that uses sound waves to create images of your heart. It provides your doctor with information about the size and shape of your heart and how well your heart's chambers and valves are working.   You may receive an ultrasound enhancing agent through an IV if needed to better visualize your heart during the echo. This procedure takes approximately one hour.  There are no restrictions for this procedure.  This will take place at [address] [address], [name] [address]    Follow-Up: At [facility], you and your health needs are our priority.  As part of our continuing mission to provide you with exceptional heart care, we have created designated Provider Care Teams.  These Care Teams include your primary Cardiologist (physician) and Advanced Practice Providers (APPs -  Physician Assistants and Nurse Practitioners) who all work together to provide you with the care you need, when you need it.  We recommend signing up for the patient portal called "MyChart".  Sign up information is provided on this After Visit Summary.  MyChart is used to connect with patients for Virtual Visits (Telemedicine).  Patients are able to view lab/test results, encounter notes, upcoming appointments, etc.  Non-urgent messages can be sent to your provider as well.   To learn more about what you can do with MyChart, go to [email or web address].    Your next appointment:   6 month(s)  Provider:   You may see Lorine Bears, MD or one of the  following Advanced Practice Providers on your designated Care Team:   Nicolasa Ducking, NP Eula Listen, PA-C Cadence Fransico Michael, PA-C Charlsie Quest, NP

## 2023-06-10 NOTE — Progress Notes (Unsigned)
Cardiology Office Note:    Date:  06/11/2023   ID:  [name], DOB May 15, 1935, MRN [id number]  PCP:  Reubin Milan, MD  [facility] Cardiologist:  Lorine Bears, MD  [facility] Electrophysiologist:  Sherryl Manges, MD   Referring MD: Reubin Milan, MD   Chief Complaint: 6 month follow-up  History of Present Illness:    [name] is a 88 y.o. 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    She underwent mitral valve replacement with a bioprosthetic valve, tricuspid valve repair, one-vessel CABG with LIMA to LAD and maze procedure in August 2016. This was complicated by sternal wound infection which required debridement. She also had complete heart block and underwent permanent pacemaker placement.   In 08/2017, she had worsening heart failure.  Echocardiogram showed an EF of 30 to 35%. She was seen by EP and it was felt that LV dysfunction was in the context of RV apical pacing. That was inactivated and she noted immediate improvement in symptoms. She had worsening SOB in 2019 and echo was ordered, which showed a drop in EF 20-25% in the setting of afib with RVR. She was rate controlled and started on amiodarone and subsequently underwent cardioversion with restoration of NSR. EF improved to 35-40% after that.    Right heart catheterization was done in June 2020 which showed moderately elevated filling pressures, severe pulmonary hypertension and mildly reduced cardiac output.  There was no evidence of left-to-right intracardiac shunting by saturation run.  Prominent V waves were noted on pulmonary capillary wedge pressure tracing suggestive of significant mitral  regurgitation.  Symptoms improved after increasing furosemide.   She underwent attempted upgrade of her pacemaker to a biventricular device but the procedure was not successful due to inability to place an LV lead.   She had recurrent atrial fibrillation in January 2021 that required cardioversion.  She has been in sinus rhythm since then.     Patient was last seen by Dr. Graciela Husbands 02/2023 and amiodarone was decreased.   Today,  the patient denies chest pain. She is on baseline SOB on 2L O2. She has chronic fatigue. She needs repeat echo to evaluate valve.   BMET   Past Medical History:  Diagnosis Date   Acute on chronic respiratory failure with hypoxia (HCC) 02/04/2019   Added automatically from request for surgery [phone]   Acute on chronic systolic heart failure (HCC)    Anxiety    Arthritis    "hands" (11/10/2015)   Breast cancer (HCC) 2002   right   CAD s/p CABG x 1    a. 04/2015 LIMA to LAD   Chronic combined systolic (congestive) and diastolic (congestive) heart failure (HCC)    a. 01/2015 Echo: EF 50-55%, Gr2 DD (preop valve surgery); b. 08/2015 Echo: EF 30-35%, diff HK. Gr2 DD; c. 09/2016 Echo: EF 50-55%; d. 07/2018 Echo: EF 20-25%; e. 03/2019 Echo: EF 35-40%, diff HK. Mod red RV fxn, RVSP 34.55mmHg. Mildly dil LA. Nl MV prosthesis. Triv AI.   COPD (chronic obstructive pulmonary disease) (HCC)    Coronary artery disease    GERD (gastroesophageal reflux disease)    History of colon polyps  History of mitral valve replacement    a. 04/2015 s/p 27 mm [facility] Mitral bovine bioprosthetic tissue valve   Hyperlipidemia    Hypertension    Hypothyroidism    Maze operation for AF w/ LAA clipping    a. 04/2015 Complete bilateral atrial lesion set using cryothermy and bipolar radiofrequency ablation with clipping of LA appendage   Meniere disease    Meniere's disease    On home oxygen therapy    "2L at night" (11/10/2015)   PAF (paroxysmal atrial fibrillation) (HCC)    Personal history  of radiation therapy    Pneumonia ~ 2010   PONV (postoperative nausea and vomiting)    Pt felt like eardrums were gonna pop   Post-surgical complete heart block, symptomatic    a. 04/2015 s/p MDT [id number] Advisa DR MRI DC PPM.   Presence of permanent cardiac pacemaker    Pulmonary hypertension (HCC)    Respiratory failure (HCC) 02/22/2019   Surgical wound, non healing - chest wall 11/10/2015   Tricuspid Regurgitation s/p Repair    a. 04/2015 s/p 26 mm Edwards mc3 ring annuloplasty   Wound infection 10/13/2015   Superficial sternal wound infection    Past Surgical History:  Procedure Laterality Date   APPENDECTOMY     APPLICATION OF A-CELL OF CHEST/ABDOMEN N/A 11/21/2015   Procedure: APPLICATION OF A-CELL OF CHEST/ABDOMEN;  Surgeon: Alena Bills Dillingham, DO;  [facility];  Service: Plastics;  Laterality: N/A;   APPLICATION OF A-CELL OF CHEST/ABDOMEN Right 12/21/2015   Procedure: APPLICATION OF A-CELL OF RIGHT CHEST;  Surgeon: Alena Bills Dillingham, DO;  [facility];  Service: Plastics;  Laterality: Right;   APPLICATION OF A-CELL OF CHEST/ABDOMEN Right 01/11/2016   Procedure: APPLICATION OF A-CELL OF RIGHT CHEST;  Surgeon: Alena Bills Dillingham, DO;  [facility];  Service: Plastics;  Laterality: Right;   APPLICATION OF A-CELL OF CHEST/ABDOMEN Right 02/12/2016   Procedure: APPLICATION OF A-CELL TO RIGHT CHEST WOUND;  Surgeon: Alena Bills Dillingham, DO;  [facility];  Service: Plastics;  Laterality: Right;   APPLICATION OF WOUND VAC N/A 05/09/2015   Procedure: APPLICATION OF WOUND VAC;  Surgeon: Purcell Nails, MD;  [facility];  Service: Thoracic;  Laterality: N/A;   APPLICATION OF WOUND VAC N/A 11/13/2015   Procedure: APPLICATION OF WOUND VAC;  Surgeon: Purcell Nails, MD;  [facility];  Service: Thoracic;  Laterality: N/A;   APPLICATION OF WOUND VAC N/A 11/21/2015   Procedure: APPLICATION OF WOUND VAC;  Surgeon: Alena Bills Dillingham, DO;  [facility];  Service: Plastics;   Laterality: N/A;   APPLICATION OF WOUND VAC Right 12/21/2015   Procedure: APPLICATION OF WOUND VAC to right chest wall ;  Surgeon: Alena Bills Dillingham, DO;  [facility];  Service: Plastics;  Laterality: Right;   APPLICATION OF WOUND VAC Right 01/11/2016   Procedure: APPLICATION OF WOUND VAC RIGHT CHEST ;  Surgeon: Alena Bills Dillingham, DO;  [facility];  Service: Plastics;  Laterality: Right;   APPLICATION OF WOUND VAC Right 01/24/2016   Procedure: APPLICATION OF WOUND VAC RIGHT CHEST WALL;  Surgeon: Alena Bills Dillingham, DO;  [facility];  Service: Plastics;  Laterality: Right;   APPLICATION OF WOUND VAC Right 02/12/2016   Procedure: RE-APPLICATION OF WOUND VAC TO RIGHT CHEST WOUND;  Surgeon: Alena Bills Dillingham, DO;  [facility];  Service: Plastics;  Laterality: Right;   BIV UPGRADE N/A 06/07/2019   Procedure: BIV UPGRADE;  Surgeon:  Duke Salvia, MD;  Location: [facility] INVASIVE CV LAB;  Service: Cardiovascular;  Laterality: N/A;   BREAST BIOPSY Left 11/25/06   neg   BREAST BIOPSY Left 01/20/12   /clip-neg   CARDIAC CATHETERIZATION  11/2013   [facility]   CARDIAC CATHETERIZATION  10/2014   [facility]   CARDIAC VALVE REPLACEMENT     CARDIOVERSION N/A 07/20/2018   Procedure: CARDIOVERSION;  Surgeon: Iran Ouch, MD;  Location: [facility] ORS;  Service: Cardiovascular;  Laterality: N/A;   CARDIOVERSION N/A 09/18/2020   Procedure: CARDIOVERSION;  Surgeon: Antonieta Iba, MD;  Location: [facility] ORS;  Service: Cardiovascular;  Laterality: N/A;   CATARACT EXTRACTION W/ INTRAOCULAR LENS  IMPLANT, BILATERAL Bilateral 2014   CLIPPING OF ATRIAL APPENDAGE N/A 04/25/2015   Procedure: CLIPPING OF ATRIAL APPENDAGE;  Surgeon: Purcell Nails, MD;  [facility];  Service: Open Heart Surgery;  Laterality: N/A;   COCHLEAR IMPLANT Left 2005?   COLONOSCOPY WITH PROPOFOL N/A 01/20/2018   Procedure: COLONOSCOPY WITH PROPOFOL;  Surgeon: Toledo, Boykin Nearing, MD;  Location: [facility] ENDOSCOPY;  Service: Gastroenterology;   Laterality: N/A;   CORONARY ANGIOPLASTY     CORONARY ARTERY BYPASS GRAFT N/A 04/25/2015   Procedure: CORONARY ARTERY BYPASS GRAFTING (CABG) x ONE, using left internal mammary artery;  Surgeon: Purcell Nails, MD;  [facility];  Service: Open Heart Surgery;  Laterality: N/A;   DILATION AND CURETTAGE OF UTERUS  "several before hysterectomy"   EP IMPLANTABLE DEVICE N/A 05/02/2015   Procedure: Pacemaker Implant;  Surgeon: Hillis Range, MD;  [facility] LAB;  Service: Cardiovascular;  Laterality: N/A;   ESOPHAGOGASTRODUODENOSCOPY (EGD) WITH PROPOFOL N/A 01/20/2018   Procedure: ESOPHAGOGASTRODUODENOSCOPY (EGD) WITH PROPOFOL;  Surgeon: Toledo, Boykin Nearing, MD;  Location: [facility] ENDOSCOPY;  Service: Gastroenterology;  Laterality: N/A;   EYE SURGERY     I & D EXTREMITY Right 12/06/2015   Procedure: IRRIGATION AND DEBRIDEMENT RIGHT CHEST WALL WITH ACELL PLACEMENT AND VAC;  Surgeon: Alena Bills Dillingham, DO;  [facility];  Service: Plastics;  Laterality: Right;   INCISION AND DRAINAGE OF WOUND N/A 11/21/2015   Procedure: IRRIGATION AND DEBRIDEMENT WOUND;  Surgeon: Alena Bills Dillingham, DO;  [facility];  Service: Plastics;  Laterality: N/A;   INCISION AND DRAINAGE OF WOUND Right 12/21/2015   Procedure: IRRIGATION AND DEBRIDEMENT right chest wall WOUND;  Surgeon: Alena Bills Dillingham, DO;  [facility];  Service: Plastics;  Laterality: Right;  right chest wall    INCISION AND DRAINAGE OF WOUND Right 01/24/2016   Procedure: IRRIGATION AND DEBRIDEMENT RIGHT CHEST WALL WOUND;  Surgeon: Alena Bills Dillingham, DO;  [facility];  Service: Plastics;  Laterality: Right;   INCISION AND DRAINAGE OF WOUND Right 03/28/2016   Procedure: IRRIGATION AND DEBRIDEMENT RIGHT CHEST WALL WOUND WITH A Cell Placement;  Surgeon: Alena Bills Dillingham, DO;  [facility];  Service: Plastics;  Laterality: Right;   INCISION AND DRAINAGE OF WOUND Right 05/17/2016   Procedure: IRRIGATION AND DEBRIDEMENT OF RIGHT CHEST  WOUND WITH A CELL PLACEMENT;  Surgeon: Alena Bills Dillingham, DO;  [facility];  Service: Plastics;  Laterality: Right;   INSERT / REPLACE / REMOVE PACEMAKER     IRRIGATION AND DEBRIDEMENT OF WOUND WITH SPLIT THICKNESS SKIN GRAFT Right 01/11/2016   Procedure: IRRIGATION AND DEBRIDEMENT OF RIGHT CHEST WOUND ;  Surgeon: Alena Bills Dillingham, DO;  [facility];  Service: Plastics;  Laterality: Right;   MASTECTOMY, PARTIAL Right 2002   positive, partial  MAZE N/A 04/25/2015   Procedure: MAZE;  Surgeon: Purcell Nails, MD;  Location: [facility] OR;  Service: Open Heart Surgery;  Laterality: N/A;   MITRAL VALVE REPAIR N/A 04/25/2015   Procedure: MITRAL VALVE  REPLACEMENT using a 27 mm Edwards Perimount Magna Mitral Ease Valve;  Surgeon: Purcell Nails, MD;  [facility];  Service: Open Heart Surgery;  Laterality: N/A;   RIGHT HEART CATH N/A 02/08/2019   Procedure: RIGHT HEART CATH;  Surgeon: Iran Ouch, MD;  Location: [facility] INVASIVE CV LAB;  Service: Cardiovascular;  Laterality: N/A;   SKIN SPLIT GRAFT Right 02/12/2016   Procedure: IRRIGATION AND DEBRIDEMENT RIGHT CHEST WOUND;  Surgeon: Alena Bills Dillingham, DO;  [facility];  Service: Plastics;  Laterality: Right;   STERNAL WIRES REMOVAL N/A 06/05/2015   Procedure: STERNAL WIRES REMOVAL;  Surgeon: Purcell Nails, MD;  [facility];  Service: Thoracic;  Laterality: N/A;   STERNAL WOUND DEBRIDEMENT N/A 05/09/2015   Procedure: STERNAL WOUND DEBRIDEMENT;  Surgeon: Purcell Nails, MD;  [facility];  Service: Thoracic;  Laterality: N/A;   STERNAL WOUND DEBRIDEMENT N/A 11/13/2015   Procedure: Excisional drainage of RIGHT Chest wall mass and breast mass ;  Surgeon: Purcell Nails, MD;  [facility];  Service: Thoracic;  Laterality: N/A;   TEE WITHOUT CARDIOVERSION N/A 04/25/2015   Procedure: TRANSESOPHAGEAL ECHOCARDIOGRAM (TEE);  Surgeon: Purcell Nails, MD;  Location: [facility] OR;  Service: Open Heart Surgery;  Laterality: N/A;   TONSILLECTOMY      TRAM Right 11/18/2015   Procedure: VRAM Vertical Rectus Abdominus Muscle Flap;  Surgeon: Alena Bills Dillingham, DO;  [facility];  Service: Plastics;  Laterality: Right;  RIght Back   TRICUSPID VALVE REPLACEMENT N/A 04/25/2015   Procedure: TRICUSPID VALVE REPAIR;  Surgeon: Purcell Nails, MD;  [facility];  Service: Open Heart Surgery;  Laterality: N/A;   VAGINAL HYSTERECTOMY      Current Medications: Current Meds  Medication Sig   acetaminophen (TYLENOL) 500 MG tablet Take 1,000 mg by mouth every 6 (six) hours as needed (for pain.).   amiodarone (PACERONE) 200 MG tablet Take 1 tablet (200 mg) 5 days per week   bisoprolol (ZEBETA) 5 MG tablet Take 0.5 tablets (2.5 mg total) by mouth daily.   Cyanocobalamin (VITAMIN B-12 PO) Take 1,000 mcg by mouth daily.   diphenhydrAMINE (BENADRYL) 25 mg capsule Take 25 mg by mouth every 6 (six) hours as needed for allergies.   docusate sodium (COLACE) 100 MG capsule Take 100 mg by mouth 2 (two) times daily as needed (constipation.).   Ferrous Sulfate (IRON PO) Take 1 tablet by mouth daily.   furosemide (LASIX) 20 MG tablet TAKE 2 TABLETS (40 MG) IN THE MORNING AND 1 TABLET (20) MG IN THE EVENING.   gabapentin (NEURONTIN) 100 MG capsule Take 2 capsules (200 mg total) by mouth at bedtime.   ipratropium-albuterol (DUONEB) 0.5-2.5 (3) MG/3ML SOLN Inhale 3 mLs into the lungs every 6 (six) hours as needed.   levothyroxine (SYNTHROID) 88 MCG tablet TAKE 1 TABLET BY MOUTH EVERY DAY BEFORE BREAKFAST   Multiple Vitamins-Minerals (PRESERVISION AREDS 2 PO) Take 1 tablet by mouth 2 (two) times daily.    mupirocin ointment (BACTROBAN) 2 % APPLY 1 APPLICATION TOPICALLY 2 (TWO) TIMES DAILY. TO SKIN WOUND   OXYGEN Inhale 2 L into the lungs continuous.   pantoprazole (PROTONIX) 40 MG tablet TAKE 1 TABLET BY MOUTH TWICE A DAY   sacubitril-valsartan (ENTRESTO) 49-51  MG Take 1 tablet (49/51 mg) by mouth twice daily   simvastatin (ZOCOR) 10 MG tablet TAKE 1 TABLET BY  MOUTH EVERYDAY AT BEDTIME   spironolactone (ALDACTONE) 25 MG tablet TAKE 1 TABLET BY MOUTH EVERY DAY   warfarin (COUMADIN) 2.5 MG tablet TAKE 1/2 A TABLET TO 1 TABLET BY MOUTH DAILY AS DIRECTED BY THE COUMADIN CLINIC.     Allergies:   Ace inhibitors, Amoxicillin-pot clavulanate, Penicillins, Rosuvastatin, Nifedipine, Propranolol, Diazepam, Meperidine, and Propoxyphene   Social History   Socioeconomic History   Marital status: Married    Spouse name: Lissette Sittner   Number of children: 0   Years of education: Not on file   Highest education level: Not on file  Occupational History   Occupation: Retired  Tobacco Use   Smoking status: Former    Current packs/day: 0.00    Average packs/day: 1.5 packs/day for 40.0 years (60.0 ttl pk-yrs)    Types: Cigarettes    Start date: 04/20/1958    Quit date: 04/20/1998    Years since quitting: 25.1   Smokeless tobacco: Never  Vaping Use   Vaping status: Never Used  Substance and Sexual Activity   Alcohol use: Not Currently    Comment: 11/10/2015 "glass of wine a few times/year, if that"   Drug use: No   Sexual activity: Yes  Other Topics Concern   Not on file  Social History Narrative   Not on file   Social Determinants of Health   Financial Resource Strain: Low Risk  (11/15/2022)   Overall Financial Resource Strain (CARDIA)    Difficulty of Paying Living Expenses: Not hard at all  Food Insecurity: No Food Insecurity (11/15/2022)   Hunger Vital Sign    Worried About Running Out of Food in the Last Year: Never true    Ran Out of Food in the Last Year: Never true  Transportation Needs: No Transportation Needs (11/15/2022)   PRAPARE - Administrator, Civil Service (Medical): No    Lack of Transportation (Non-Medical): No  Physical Activity: Inactive (10/04/2022)   Exercise Vital Sign    Days of Exercise per Week: 0 days    Minutes of Exercise per Session: 0 min  Stress: No Stress Concern Present (10/04/2022)   [employer]  of Occupational Health - Occupational Stress Questionnaire    Feeling of Stress : Not at all  Social Connections: Moderately Isolated (10/04/2022)   Social Connection and Isolation Panel [NHANES]    Frequency of Communication with Friends and Family: More than three times a week    Frequency of Social Gatherings with Friends and Family: Twice a week    Attends Religious Services: Never    Database administrator or Organizations: No    Attends Engineer, structural: Never    Marital Status: Married     Family History: The patient's family history includes Breast cancer in her maternal aunt and paternal aunt; CVA in her mother; Heart attack in her paternal uncle; Heart disease in her brother and father.  ROS:   Please see the history of present illness.     All other systems reviewed and are negative.  EKGs/Labs/Other Studies Reviewed:    The following studies were reviewed today:  Echo 2022  1. Left ventricular ejection fraction, by estimation, is 35 to 40%. The  left ventricle has moderately decreased function. The left ventricle  demonstrates global hypokinesis. The left ventricular internal cavity size  was mildly dilated. Left ventricular  diastolic function could not be evaluated.   2. Right ventricular systolic function is severely reduced. The right  ventricular size is normal. There is severely elevated pulmonary artery  systolic pressure.   3. Left atrial size was mild to moderately dilated.   4. The mitral valve has been repaired/replaced. Trivial mitral valve  regurgitation. The mean mitral valve gradient is 5.0 mmHg. There is a 27  mm Edwards [facility] Ease bioprosthetic valve present in the mitral  position. Procedure Date: 2016.   5. The tricuspid valve is has been repaired/replaced.   6. The aortic valve has an indeterminant number of cusps. There is mild  thickening of the aortic valve. Aortic valve regurgitation is mild. No  aortic stenosis is  present.   7. The inferior vena cava is normal in size with greater than 50%  respiratory variability, suggesting right atrial pressure of 3 mmHg.    EKG:  EKG is ordered today.  The ekg ordered today demonstrates AV dual paced rhythm, 62bpm, LAD  Recent Labs: 04/09/2023: ALT 14; BUN 23; Creatinine, Ser 1.13; Hemoglobin 11.0; Platelets 136; Potassium 3.7; Sodium 144; TSH 1.500  Recent Lipid Panel    Component Value Date/Time   CHOL 164 04/09/2023 0925   TRIG 92 04/09/2023 0925   HDL 61 04/09/2023 0925   CHOLHDL 2.7 04/09/2023 0925   LDLCALC 86 04/09/2023 0925     Physical Exam:    VS:  BP 122/60 (BP Location: Left Arm, Patient Position: Sitting, Cuff Size: Normal)   Pulse 62   Ht 5\' 1"  (1.549 m)   Wt 105 lb 6.4 oz (47.8 kg)   SpO2 95%   BMI 19.92 kg/m     Wt Readings from Last 3 Encounters:  06/10/23 105 lb 6.4 oz (47.8 kg)  04/09/23 108 lb (49 kg)  02/25/23 108 lb (49 kg)     GEN:  Well nourished, well developed in no acute distress HEENT: Normal NECK: No JVD; No carotid bruits LYMPHATICS: No lymphadenopathy CARDIAC: RRR, + murmur, no rubs, gallops RESPIRATORY:  Clear to auscultation without rales, wheezing or rhonchi  ABDOMEN: Soft, non-tender, non-distended MUSCULOSKELETAL:  No edema; No deformity  SKIN: Warm and dry NEUROLOGIC:  Alert and oriented x 3 PSYCHIATRIC:  Normal affect   ASSESSMENT:    1. Chronic systolic heart failure (HCC)   2. Persistent atrial fibrillation (HCC)   3. S/P tricuspid valve repair   4. Mitral valve insufficiency, unspecified etiology   5. Complete heart block (HCC)   6. S/P placement of cardiac pacemaker    PLAN:    In order of problems listed above:  HFrEF Mixed ICM/NICM LVEF 35-40% Patient has minimal LLE. I will update echo for LLE and valve disease. Continue bisoprolol, lasix 40mg  in the AM and 20mg  in the pm, Entresto 49-51mg  daily, spiro 25mg  daily.  S/p MV replacement with bioprosthetic valve Tricuspid valve  repair Update echo as above. No acute symptoms  CAD s/p CABG x1 LIMA to LAD No chest pain reported. She has DOE and fatigue that is chronic and unchanged. No further ischemic work-up at this time. No ASA with warfarin. Continue BB and statin therapy.   COPD on 2L O2 She is on baseline O2.  Persistent Afib Continue amiodarone 200mg  daily and bisoprolol 2.5mg  daily. Continue warfarin for stroke ppx. Patient follows with EP.  CHB s/p PPM Most recent check showed normally functioning device.   Disposition: Follow up in 6 month(s) with Md/APP    Signed, Ilia Dimaano H  Lorna Few  06/11/2023 9:04 AM    [facility]

## 2023-06-18 NOTE — Progress Notes (Signed)
Remote pacemaker transmission.   

## 2023-07-01 ENCOUNTER — Ambulatory Visit: Payer: Medicare Other | Attending: Medical

## 2023-07-01 DIAGNOSIS — I340 Nonrheumatic mitral (valve) insufficiency: Secondary | ICD-10-CM | POA: Diagnosis present

## 2023-07-02 LAB — ECHOCARDIOGRAM COMPLETE
Area-P 1/2: 3.95 cm2
S' Lateral: 4.4 cm

## 2023-07-09 ENCOUNTER — Ambulatory Visit: Payer: Medicare Other | Attending: Cardiovascular Disease

## 2023-07-09 DIAGNOSIS — Z953 Presence of xenogenic heart valve: Secondary | ICD-10-CM | POA: Diagnosis not present

## 2023-07-09 DIAGNOSIS — Z5181 Encounter for therapeutic drug level monitoring: Secondary | ICD-10-CM

## 2023-07-09 DIAGNOSIS — I340 Nonrheumatic mitral (valve) insufficiency: Secondary | ICD-10-CM | POA: Insufficient documentation

## 2023-07-09 LAB — POCT INR: INR: 2.1 (ref 2.0–3.0)

## 2023-07-09 NOTE — Patient Instructions (Signed)
Continue 1 tablet daily EXCEPT 0.5 TABLET ON MONDAYS, WEDNESDAYS, and FRIDAYS - Recheck INR in 6 weeks.

## 2023-08-04 ENCOUNTER — Other Ambulatory Visit: Payer: Self-pay | Admitting: Cardiovascular Disease

## 2023-08-04 ENCOUNTER — Other Ambulatory Visit: Payer: Self-pay | Admitting: Internal Medicine

## 2023-08-04 DIAGNOSIS — Z953 Presence of xenogenic heart valve: Secondary | ICD-10-CM

## 2023-08-04 DIAGNOSIS — E039 Hypothyroidism, unspecified: Secondary | ICD-10-CM

## 2023-08-04 NOTE — Telephone Encounter (Signed)
last visit 06/10/23 with plan to f/u in 6 months.  Next visit: 11/20/23

## 2023-08-04 NOTE — Telephone Encounter (Signed)
Warfarin 2.5mg  refill Afib Last INR 07/09/23 Last OV 06/10/23

## 2023-08-05 NOTE — Telephone Encounter (Signed)
Requested by interface surescripts. Future visit in 1 month Requested Prescriptions  Pending Prescriptions Disp Refills   levothyroxine (SYNTHROID) 88 MCG tablet [Pharmacy Med Name: LEVOTHYROXINE 88 MCG TABLET] 90 tablet 1    Sig: TAKE 1 TABLET BY MOUTH EVERY DAY BEFORE BREAKFAST     Endocrinology:  Hypothyroid Agents Passed - 08/04/2023  2:12 PM      Passed - TSH in normal range and within 360 days    TSH  Date Value Ref Range Status  04/09/2023 1.500 0.450 - 4.500 uIU/mL Final         Passed - Valid encounter within last 12 months    Recent Outpatient Visits           3 months ago Adult hypothyroidism   [place] [facility] at [facility], Nyoka Cowden, MD   6 months ago Non-healing ulcer of lower leg, right, limited to breakdown of skin [facility])   [place] [facility] at [facility], Nyoka Cowden, MD   7 months ago Cellulitis of right lower extremity   [place] [facility] at [facility], Nyoka Cowden, MD   8 months ago Cellulitis of right lower extremity   [place] [facility] at [facility], Nyoka Cowden, MD   11 months ago Essential hypertension   [facility] at [facility], Nyoka Cowden, MD       Future Appointments             In 1 month Judithann Graves, Nyoka Cowden, MD [facility] at [facility], [facility]   In 3 months Kirke Corin, Chelsea Aus, MD [facility] at [place]   In 8 months Judithann Graves, Nyoka Cowden, MD [facility] at [facility], [facility]

## 2023-08-19 ENCOUNTER — Encounter: Payer: Self-pay | Admitting: Internal Medicine

## 2023-08-19 ENCOUNTER — Ambulatory Visit: Payer: Medicare Other | Attending: Internal Medicine | Admitting: Internal Medicine

## 2023-08-19 VITALS — BP 110/60 | HR 76 | Ht 61.0 in | Wt 106.4 lb

## 2023-08-19 DIAGNOSIS — I4819 Other persistent atrial fibrillation: Secondary | ICD-10-CM | POA: Diagnosis not present

## 2023-08-19 DIAGNOSIS — Z79899 Other long term (current) drug therapy: Secondary | ICD-10-CM | POA: Diagnosis not present

## 2023-08-19 DIAGNOSIS — I442 Atrioventricular block, complete: Secondary | ICD-10-CM | POA: Diagnosis present

## 2023-08-19 DIAGNOSIS — Z950 Presence of cardiac pacemaker: Secondary | ICD-10-CM | POA: Diagnosis present

## 2023-08-19 NOTE — Progress Notes (Signed)
See reply Patient Care Team: Reubin Milan, MD as PCP - General (Internal Medicine) Iran Ouch, MD as PCP - Cardiology (Cardiology) Duke Salvia, MD as PCP - Electrophysiology (Cardiology) Delma Freeze, FNP as Nurse Practitioner (Family Medicine) Erin Fulling, MD as Consulting Physician (Pulmonary Disease) Sherlon Handing, MD as Consulting Physician (Internal Medicine)   HPI  [name] is a 88 y.o. female Seen in follow-up for a pacemaker Medtronic implanted 8/16 [facility])  in the context of multi-valvular surgery bioprosthetic mitral valve replacement, tricuspid valve repair and MAZE operation w postoperative complete heart block; CRT upgrade attempted (SK)  failed  9/20--  Cath/echo>> progressive RV dysfunction in setting of Pulm HTN with oxygen dependent lung disease  She is deaf secondary to Mnire's disease and has a cochlear implant  Persistent atrial fibrillation;  anticoagulated with Coumadin and Rx with amio (started 11/19)  Reverted to AFl, assoc w palps weakness and breathlessness, seen by Dr CL with failed efforts to pace terminate and >> DCCV  Chronic shortness of breath.  No chest pain.  Everything is been really stable for couple of years.  No interval atrial fibrillation.    DATE TEST EF   5/16 Echo   65 %   12/16 Echo   30-35 %   1/18 Echo  55%   11/19 Echo  20-25%   12/19 Echo  35%   6/20 RHC  RVsys 70 PCWP 23 RA  7/20 Echo  35% RV dysfunction mod  6/22 Echo   35-40% RV dysfunction severe PA press >58  10/24 Echo  30-35% RV dysfunction  PA pressure about 56           Date Cr K Hgb TSH LFTs  8/17     9.2    5/18 0.8  11.5    5/19 0.8 3.9 13.5 (MCV 103<<89)    11/19 0.8 3.9 9.9 2.168 43-  6/20 0.84 4.9 8.0 2.860 (2/20) 51  11/20 0.74 4.2 11.8 2.119   6/21 0.8 3.8 13.1 1.039   11/21 0.82 4.1 12.2 (1/22) 0.824 14  12/22 1.01 3.2  1.39 15  6/23 0.96 3.7 11.7<<9.9 0.99 13  12/23 1.07 3.8 11.8 0.96 19  7/24 1.13 3.7  11.0 1.5 14        Past Medical History:  Diagnosis Date   Acute on chronic respiratory failure with hypoxia (HCC) 02/04/2019   Added automatically from request for surgery [id number]   Acute on chronic systolic heart failure (HCC)    Anxiety    Arthritis    "hands" (11/10/2015)   Breast cancer (HCC) 2002   right   CAD s/p CABG x 1    a. 04/2015 LIMA to LAD   Chronic combined systolic (congestive) and diastolic (congestive) heart failure (HCC)    a. 01/2015 Echo: EF 50-55%, Gr2 DD (preop valve surgery); b. 08/2015 Echo: EF 30-35%, diff HK. Gr2 DD; c. 09/2016 Echo: EF 50-55%; d. 07/2018 Echo: EF 20-25%; e. 03/2019 Echo: EF 35-40%, diff HK. Mod red RV fxn, RVSP 34.12mmHg. Mildly dil LA. Nl MV prosthesis. Triv AI.   COPD (chronic obstructive pulmonary disease) (HCC)    Coronary artery disease    GERD (gastroesophageal reflux disease)    History of colon polyps    History of mitral valve replacement    a. 04/2015 s/p 27 mm [facility] Mitral bovine bioprosthetic tissue valve   Hyperlipidemia    Hypertension    Hypothyroidism  Maze operation for AF w/ LAA clipping    a. 04/2015 Complete bilateral atrial lesion set using cryothermy and bipolar radiofrequency ablation with clipping of LA appendage   Meniere disease    Meniere's disease    On home oxygen therapy    "2L at night" (11/10/2015)   PAF (paroxysmal atrial fibrillation) (HCC)    Personal history of radiation therapy    Pneumonia ~ 2010   PONV (postoperative nausea and vomiting)    Pt felt like eardrums were gonna pop   Post-surgical complete heart block, symptomatic    a. 04/2015 s/p MDT [id number] Advisa DR MRI DC PPM.   Presence of permanent cardiac pacemaker    Pulmonary hypertension (HCC)    Respiratory failure (HCC) 02/22/2019   Surgical wound, non healing - chest wall 11/10/2015   Tricuspid Regurgitation s/p Repair    a. 04/2015 s/p 26 mm Edwards mc3 ring annuloplasty   Wound infection 10/13/2015   Superficial sternal wound  infection    Past Surgical History:  Procedure Laterality Date   APPENDECTOMY     APPLICATION OF A-CELL OF CHEST/ABDOMEN N/A 11/21/2015   Procedure: APPLICATION OF A-CELL OF CHEST/ABDOMEN;  Surgeon: Alena Bills Dillingham, DO;  [facility];  Service: Plastics;  Laterality: N/A;   APPLICATION OF A-CELL OF CHEST/ABDOMEN Right 12/21/2015   Procedure: APPLICATION OF A-CELL OF RIGHT CHEST;  Surgeon: Alena Bills Dillingham, DO;  [facility];  Service: Plastics;  Laterality: Right;   APPLICATION OF A-CELL OF CHEST/ABDOMEN Right 01/11/2016   Procedure: APPLICATION OF A-CELL OF RIGHT CHEST;  Surgeon: Alena Bills Dillingham, DO;  [facility];  Service: Plastics;  Laterality: Right;   APPLICATION OF A-CELL OF CHEST/ABDOMEN Right 02/12/2016   Procedure: APPLICATION OF A-CELL TO RIGHT CHEST WOUND;  Surgeon: Alena Bills Dillingham, DO;  [facility];  Service: Plastics;  Laterality: Right;   APPLICATION OF WOUND VAC N/A 05/09/2015   Procedure: APPLICATION OF WOUND VAC;  Surgeon: Purcell Nails, MD;  [facility];  Service: Thoracic;  Laterality: N/A;   APPLICATION OF WOUND VAC N/A 11/13/2015   Procedure: APPLICATION OF WOUND VAC;  Surgeon: Purcell Nails, MD;  [facility];  Service: Thoracic;  Laterality: N/A;   APPLICATION OF WOUND VAC N/A 11/21/2015   Procedure: APPLICATION OF WOUND VAC;  Surgeon: Alena Bills Dillingham, DO;  [facility];  Service: Plastics;  Laterality: N/A;   APPLICATION OF WOUND VAC Right 12/21/2015   Procedure: APPLICATION OF WOUND VAC to right chest wall ;  Surgeon: Alena Bills Dillingham, DO;  [facility];  Service: Plastics;  Laterality: Right;   APPLICATION OF WOUND VAC Right 01/11/2016   Procedure: APPLICATION OF WOUND VAC RIGHT CHEST ;  Surgeon: Alena Bills Dillingham, DO;  [facility];  Service: Plastics;  Laterality: Right;   APPLICATION OF WOUND VAC Right 01/24/2016   Procedure: APPLICATION OF WOUND VAC RIGHT CHEST WALL;  Surgeon: Alena Bills Dillingham, DO;  [facility];   Service: Plastics;  Laterality: Right;   APPLICATION OF WOUND VAC Right 02/12/2016   Procedure: RE-APPLICATION OF WOUND VAC TO RIGHT CHEST WOUND;  Surgeon: Alena Bills Dillingham, DO;  [facility];  Service: Plastics;  Laterality: Right;   BIV UPGRADE N/A 06/07/2019   Procedure: BIV UPGRADE;  Surgeon: Duke Salvia, MD;  Location: [facility] INVASIVE CV LAB;  Service: Cardiovascular;  Laterality: N/A;   BREAST BIOPSY Left 11/25/06   neg   BREAST BIOPSY Left 01/20/12   /clip-neg  CARDIAC CATHETERIZATION  11/2013   [facility]   CARDIAC CATHETERIZATION  10/2014   [facility]   CARDIAC VALVE REPLACEMENT     CARDIOVERSION N/A 07/20/2018   Procedure: CARDIOVERSION;  Surgeon: Iran Ouch, MD;  Location: [facility] ORS;  Service: Cardiovascular;  Laterality: N/A;   CARDIOVERSION N/A 09/18/2020   Procedure: CARDIOVERSION;  Surgeon: Antonieta Iba, MD;  Location: [facility] ORS;  Service: Cardiovascular;  Laterality: N/A;   CATARACT EXTRACTION W/ INTRAOCULAR LENS  IMPLANT, BILATERAL Bilateral 2014   CLIPPING OF ATRIAL APPENDAGE N/A 04/25/2015   Procedure: CLIPPING OF ATRIAL APPENDAGE;  Surgeon: Purcell Nails, MD;  [facility];  Service: Open Heart Surgery;  Laterality: N/A;   COCHLEAR IMPLANT Left 2005?   COLONOSCOPY WITH PROPOFOL N/A 01/20/2018   Procedure: COLONOSCOPY WITH PROPOFOL;  Surgeon: Toledo, Boykin Nearing, MD;  Location: [facility] ENDOSCOPY;  Service: Gastroenterology;  Laterality: N/A;   CORONARY ANGIOPLASTY     CORONARY ARTERY BYPASS GRAFT N/A 04/25/2015   Procedure: CORONARY ARTERY BYPASS GRAFTING (CABG) x ONE, using left internal mammary artery;  Surgeon: Purcell Nails, MD;  [facility];  Service: Open Heart Surgery;  Laterality: N/A;   DILATION AND CURETTAGE OF UTERUS  "several before hysterectomy"   EP IMPLANTABLE DEVICE N/A 05/02/2015   Procedure: Pacemaker Implant;  Surgeon: Hillis Range, MD;  [facility];  Service: Cardiovascular;  Laterality: N/A;   ESOPHAGOGASTRODUODENOSCOPY (EGD) WITH  PROPOFOL N/A 01/20/2018   Procedure: ESOPHAGOGASTRODUODENOSCOPY (EGD) WITH PROPOFOL;  Surgeon: Toledo, Boykin Nearing, MD;  Location: [facility] ENDOSCOPY;  Service: Gastroenterology;  Laterality: N/A;   EYE SURGERY     I & D EXTREMITY Right 12/06/2015   Procedure: IRRIGATION AND DEBRIDEMENT RIGHT CHEST WALL WITH ACELL PLACEMENT AND VAC;  Surgeon: Alena Bills Dillingham, DO;  [facility];  Service: Plastics;  Laterality: Right;   INCISION AND DRAINAGE OF WOUND N/A 11/21/2015   Procedure: IRRIGATION AND DEBRIDEMENT WOUND;  Surgeon: Alena Bills Dillingham, DO;  [facility];  Service: Plastics;  Laterality: N/A;   INCISION AND DRAINAGE OF WOUND Right 12/21/2015   Procedure: IRRIGATION AND DEBRIDEMENT right chest wall WOUND;  Surgeon: Alena Bills Dillingham, DO;  [facility];  Service: Plastics;  Laterality: Right;  right chest wall    INCISION AND DRAINAGE OF WOUND Right 01/24/2016   Procedure: IRRIGATION AND DEBRIDEMENT RIGHT CHEST WALL WOUND;  Surgeon: Alena Bills Dillingham, DO;  [facility];  Service: Plastics;  Laterality: Right;   INCISION AND DRAINAGE OF WOUND Right 03/28/2016   Procedure: IRRIGATION AND DEBRIDEMENT RIGHT CHEST WALL WOUND WITH A Cell Placement;  Surgeon: Alena Bills Dillingham, DO;  [facility];  Service: Plastics;  Laterality: Right;   INCISION AND DRAINAGE OF WOUND Right 05/17/2016   Procedure: IRRIGATION AND DEBRIDEMENT OF RIGHT CHEST WOUND WITH A CELL PLACEMENT;  Surgeon: Alena Bills Dillingham, DO;  [facility];  Service: Plastics;  Laterality: Right;   INSERT / REPLACE / REMOVE PACEMAKER     IRRIGATION AND DEBRIDEMENT OF WOUND WITH SPLIT THICKNESS SKIN GRAFT Right 01/11/2016   Procedure: IRRIGATION AND DEBRIDEMENT OF RIGHT CHEST WOUND ;  Surgeon: Alena Bills Dillingham, DO;  [facility];  Service: Plastics;  Laterality: Right;   MASTECTOMY, PARTIAL Right 2002   positive, partial   MAZE N/A 04/25/2015   Procedure: MAZE;  Surgeon: Purcell Nails, MD;  Location: [facility] OR;  Service: Open  Heart Surgery;  Laterality: N/A;   MITRAL VALVE REPAIR N/A 04/25/2015   Procedure:  MITRAL VALVE  REPLACEMENT using a 27 mm Edwards Perimount Magna Mitral Ease Valve;  Surgeon: Purcell Nails, MD;  [facility];  Service: Open Heart Surgery;  Laterality: N/A;   RIGHT HEART CATH N/A 02/08/2019   Procedure: RIGHT HEART CATH;  Surgeon: Iran Ouch, MD;  Location: [facility] INVASIVE CV LAB;  Service: Cardiovascular;  Laterality: N/A;   SKIN SPLIT GRAFT Right 02/12/2016   Procedure: IRRIGATION AND DEBRIDEMENT RIGHT CHEST WOUND;  Surgeon: Alena Bills Dillingham, DO;  [facility];  Service: Plastics;  Laterality: Right;   STERNAL WIRES REMOVAL N/A 06/05/2015   Procedure: STERNAL WIRES REMOVAL;  Surgeon: Purcell Nails, MD;  [facility];  Service: Thoracic;  Laterality: N/A;   STERNAL WOUND DEBRIDEMENT N/A 05/09/2015   Procedure: STERNAL WOUND DEBRIDEMENT;  Surgeon: Purcell Nails, MD;  [facility];  Service: Thoracic;  Laterality: N/A;   STERNAL WOUND DEBRIDEMENT N/A 11/13/2015   Procedure: Excisional drainage of RIGHT Chest wall mass and breast mass ;  Surgeon: Purcell Nails, MD;  [facility];  Service: Thoracic;  Laterality: N/A;   TEE WITHOUT CARDIOVERSION N/A 04/25/2015   Procedure: TRANSESOPHAGEAL ECHOCARDIOGRAM (TEE);  Surgeon: Purcell Nails, MD;  Location: [facility] OR;  Service: Open Heart Surgery;  Laterality: N/A;   TONSILLECTOMY     TRAM Right 11/18/2015   Procedure: VRAM Vertical Rectus Abdominus Muscle Flap;  Surgeon: Alena Bills Dillingham, DO;  [facility];  Service: Plastics;  Laterality: Right;  RIght Back   TRICUSPID VALVE REPLACEMENT N/A 04/25/2015   Procedure: TRICUSPID VALVE REPAIR;  Surgeon: Purcell Nails, MD;  [facility];  Service: Open Heart Surgery;  Laterality: N/A;   VAGINAL HYSTERECTOMY      Current Outpatient Medications  Medication Sig Dispense Refill   acetaminophen (TYLENOL) 500 MG tablet Take 1,000 mg by mouth every 6 (six) hours as needed (for pain.).      amiodarone (PACERONE) 200 MG tablet Take 1 tablet (200 mg) 5 days per week 90 tablet 3   bisoprolol (ZEBETA) 5 MG tablet TAKE 1/2 TABLET BY MOUTH DAILY 45 tablet 1   Cyanocobalamin (VITAMIN B-12 PO) Take 1,000 mcg by mouth daily.     diphenhydrAMINE (BENADRYL) 25 mg capsule Take 25 mg by mouth every 6 (six) hours as needed for allergies.     docusate sodium (COLACE) 100 MG capsule Take 100 mg by mouth 2 (two) times daily as needed (constipation.).     Ferrous Sulfate (IRON PO) Take 1 tablet by mouth daily.     furosemide (LASIX) 20 MG tablet TAKE 2 TABLETS (40 MG) IN THE MORNING AND 1 TABLET (20) MG IN THE EVENING. 270 tablet 1   gabapentin (NEURONTIN) 100 MG capsule Take 2 capsules (200 mg total) by mouth at bedtime. 180 capsule 1   ipratropium-albuterol (DUONEB) 0.5-2.5 (3) MG/3ML SOLN Inhale 3 mLs into the lungs every 6 (six) hours as needed. 120 mL 11   levothyroxine (SYNTHROID) 88 MCG tablet TAKE 1 TABLET BY MOUTH EVERY DAY BEFORE BREAKFAST 90 tablet 1   Multiple Vitamins-Minerals (PRESERVISION AREDS 2 PO) Take 1 tablet by mouth 2 (two) times daily.      mupirocin ointment (BACTROBAN) 2 % APPLY 1 APPLICATION TOPICALLY 2 (TWO) TIMES DAILY. TO SKIN WOUND 22 g 0   OXYGEN Inhale 2 L into the lungs continuous.     pantoprazole (PROTONIX) 40 MG tablet TAKE 1 TABLET BY MOUTH TWICE A DAY 180 tablet 1   sacubitril-valsartan (ENTRESTO)  49-51 MG Take 1 tablet (49/51 mg) by mouth twice daily 180 tablet 3   simvastatin (ZOCOR) 10 MG tablet TAKE 1 TABLET BY MOUTH EVERYDAY AT BEDTIME 90 tablet 1   spironolactone (ALDACTONE) 25 MG tablet TAKE 1 TABLET BY MOUTH EVERY DAY 90 tablet 1   warfarin (COUMADIN) 2.5 MG tablet TAKE 1/2 A TABLET TO 1 TABLET BY MOUTH DAILY AS DIRECTED BY THE COUMADIN CLINIC. 90 tablet 1   No current facility-administered medications for this visit.    Allergies  Allergen Reactions   Ace Inhibitors Cough and Other (See Comments)   Amoxicillin-Pot Clavulanate Swelling and Other  (See Comments)    SWOLLEN JOINTS  Has patient had a PCN reaction causing immediate rash, facial/tongue/throat swelling, SOB or lightheadedness with hypotension: Yes Has patient had a PCN reaction causing severe rash involving mucus membranes or skin necrosis: No Has patient had a PCN reaction that required hospitalization No Has patient had a PCN reaction occurring within the last 10 years: No If all of the above answers are "NO", then may proceed with Cephalosporin   Penicillins Other (See Comments) and Swelling    SWOLLEN JOINTS   Has patient had a PCN reaction causing immediate rash, facial/tongue/throat swelling, SOB or lightheadedness with hypotension: Yes  Has patient had a PCN reaction causing severe rash involving mucus membranes or skin necrosis: No  Has patient had a PCN reaction that required hospitalization No  Has patient had a PCN reaction occurring within the last 10 years: No  If all of the above answers are "NO", then may proceed with Cephalosporin use.  Swollen joints  Swollen joints, Has patient had a PCN reaction causing immediate rash, facial/tongue/throat swelling, SOB or lightheadedness with hypotension: Yes, Has patient had a PCN reaction causing severe rash involving mucus membranes or skin necrosis: No, Has patient had a PCN reaction that required hospitalization No, Has patient had a PCN reaction occurring within the last 10 years: No, If all of the above answers are "NO", then may proceed with Cephalosporin use.   Rosuvastatin Other (See Comments)   Nifedipine Other (See Comments)    UNSPECIFIED   Propranolol Other (See Comments)    UNSPECIFIED   Diazepam Other (See Comments)    Made her "hyper"    Meperidine Other (See Comments)    Made her "hyper"     Propoxyphene Other (See Comments)    Made her "hyper"        Review of Systems negative except from HPI and PMH  Physical Exam BP 110/60 (BP Location: Left Arm, Patient Position: Sitting, Cuff  Size: Normal)   Pulse 76   Ht 5\' 1"  (1.549 m)   Wt 106 lb 6 oz (48.3 kg)   SpO2 97%   BMI 20.10 kg/m   Well developed and well nourished wearing oxygen and sitting comfortably HENT normal Neck supple with JVP-flat Clear Device pocket well healed; without hematoma or erythema.  There is no tethering  Regular rate and rhythm, no  gallop 2/6 murmur Abd-soft with active BS No Clubbing cyanosis  edema Skin-warm and dry A & Oriented  Grossly normal sensory and motor function  ECG AV pacing  QRSd 210  Device function is normal. Programming changes none  See Paceart for details    Assessment and  Plan  Atrial fibrillation-persistent status post maze operation  Recurrent  High Risk Medication Surveillance Amiodarone   Complete heart block -Intermittent  Sinus node dysfunction   First-degree AV block profound--- right bundle  branch block  Status post mitral valve replacement-bioprosthetic with tricuspid valve repair  Pacemaker-Medtronic     Oxygen dependent lung disease    Cardiomyopathy   Tremor and gait instability  Anemia  No interval atrial fibrillation.  Will continue the amiodarone 5 days a week.  Check labs.  In 6 months if no interval atrial fibrillation we will decrease to 100 mg a day.  Volume status is stable.  Continue her current diuretic regime spironolactone  With her cardiomyopathy, continue her Entresto Aldactone and bisoprolol.

## 2023-08-19 NOTE — Patient Instructions (Signed)
Medication Instructions:  Your physician recommends that you continue on your current medications as directed. Please refer to the Current Medication list given to you today.  *If you need a refill on your cardiac medications before your next appointment, please call your pharmacy*   Lab Work: TSH and CMET today If you have labs (blood work) drawn today and your tests are completely normal, you will receive your results only by: MyChart Message (if you have MyChart) OR A paper copy in the mail If you have any lab test that is abnormal or we need to change your treatment, we will call you to review the results.   Testing/Procedures: None ordered.    Follow-Up: At [facility], you and your health needs are our priority.  As part of our continuing mission to provide you with exceptional heart care, we have created designated Provider Care Teams.  These Care Teams include your primary Cardiologist (physician) and Advanced Practice Providers (APPs -  Physician Assistants and Nurse Practitioners) who all work together to provide you with the care you need, when you need it.  We recommend signing up for the patient portal called "MyChart".  Sign up information is provided on this After Visit Summary.  MyChart is used to connect with patients for Virtual Visits (Telemedicine).  Patients are able to view lab/test results, encounter notes, upcoming appointments, etc.  Non-urgent messages can be sent to your provider as well.   To learn more about what you can do with MyChart, go to [email or web address].    Your next appointment:   6 months with Dr Graciela Husbands

## 2023-08-20 ENCOUNTER — Ambulatory Visit: Payer: Medicare Other | Attending: Cardiovascular Disease

## 2023-08-20 DIAGNOSIS — Z5181 Encounter for therapeutic drug level monitoring: Secondary | ICD-10-CM | POA: Insufficient documentation

## 2023-08-20 DIAGNOSIS — Z953 Presence of xenogenic heart valve: Secondary | ICD-10-CM | POA: Insufficient documentation

## 2023-08-20 DIAGNOSIS — I340 Nonrheumatic mitral (valve) insufficiency: Secondary | ICD-10-CM | POA: Insufficient documentation

## 2023-08-20 LAB — COMPREHENSIVE METABOLIC PANEL
ALT: 14 [IU]/L (ref 0–32)
AST: 17 [IU]/L (ref 0–40)
Albumin: 4.1 g/dL (ref 3.7–4.7)
Alkaline Phosphatase: 61 [IU]/L (ref 44–121)
BUN/Creatinine Ratio: 20 (ref 12–28)
BUN: 20 mg/dL (ref 8–27)
Bilirubin Total: 0.3 mg/dL (ref 0.0–1.2)
CO2: 35 mmol/L — ABNORMAL HIGH (ref 20–29)
Calcium: 9.7 mg/dL (ref 8.7–10.3)
Chloride: 99 mmol/L (ref 96–106)
Creatinine, Ser: 0.99 mg/dL (ref 0.57–1.00)
Globulin, Total: 2.4 g/dL (ref 1.5–4.5)
Glucose: 99 mg/dL (ref 70–99)
Potassium: 3.7 mmol/L (ref 3.5–5.2)
Sodium: 147 mmol/L — ABNORMAL HIGH (ref 134–144)
Total Protein: 6.5 g/dL (ref 6.0–8.5)
eGFR: 56 mL/min/{1.73_m2} — ABNORMAL LOW (ref >59)

## 2023-08-20 LAB — POCT INR: INR: 2.1 (ref 2.0–3.0)

## 2023-08-20 LAB — TSH: TSH: 0.748 u[IU]/mL (ref 0.450–4.500)

## 2023-08-20 NOTE — Patient Instructions (Signed)
Continue 1 tablet daily EXCEPT 0.5 TABLET ON MONDAYS, WEDNESDAYS, and FRIDAYS - Recheck INR in 6 weeks.

## 2023-08-26 ENCOUNTER — Encounter: Payer: Medicare Other | Admitting: Internal Medicine

## 2023-08-29 ENCOUNTER — Encounter: Payer: Self-pay | Admitting: Internal Medicine

## 2023-09-04 ENCOUNTER — Ambulatory Visit (INDEPENDENT_AMBULATORY_CARE_PROVIDER_SITE_OTHER): Payer: Medicare Other

## 2023-09-04 DIAGNOSIS — I428 Other cardiomyopathies: Secondary | ICD-10-CM | POA: Diagnosis not present

## 2023-09-06 LAB — CUP PACEART REMOTE DEVICE CHECK
Battery Remaining Longevity: 15 mo
Battery Voltage: 2.9 V
Brady Statistic AP VP Percent: 98.79 %
Brady Statistic AP VS Percent: 0.01 %
Brady Statistic AS VP Percent: 1.2 %
Brady Statistic AS VS Percent: 0 %
Brady Statistic RA Percent Paced: 98.78 %
Brady Statistic RV Percent Paced: 99.98 %
Date Time Interrogation Session: 20241226140102
Implantable Lead Connection Status: 753985
Implantable Lead Connection Status: 753985
Implantable Lead Implant Date: 20160823
Implantable Lead Implant Date: 20160823
Implantable Lead Location: 753859
Implantable Lead Location: 753860
Implantable Lead Model: 5076
Implantable Lead Model: 5076
Implantable Pulse Generator Implant Date: 20160823
Lead Channel Impedance Value: 342 Ohm
Lead Channel Impedance Value: 361 Ohm
Lead Channel Impedance Value: 380 Ohm
Lead Channel Impedance Value: 399 Ohm
Lead Channel Pacing Threshold Amplitude: 0.5 V
Lead Channel Pacing Threshold Amplitude: 0.5 V
Lead Channel Pacing Threshold Pulse Width: 0.4 ms
Lead Channel Pacing Threshold Pulse Width: 0.4 ms
Lead Channel Sensing Intrinsic Amplitude: 1.125 mV
Lead Channel Sensing Intrinsic Amplitude: 1.125 mV
Lead Channel Sensing Intrinsic Amplitude: 12.125 mV
Lead Channel Sensing Intrinsic Amplitude: 12.125 mV
Lead Channel Setting Pacing Amplitude: 1.5 V
Lead Channel Setting Pacing Amplitude: 2 V
Lead Channel Setting Pacing Pulse Width: 0.4 ms
Lead Channel Setting Sensing Sensitivity: 2.8 mV
Zone Setting Status: 755011

## 2023-09-16 ENCOUNTER — Telehealth: Payer: Self-pay

## 2023-09-16 MED ORDER — FUROSEMIDE 20 MG PO TABS: ORAL_TABLET | ORAL | Status: DC

## 2023-09-16 NOTE — Telephone Encounter (Signed)
-----   Message from Elspeth Sage sent at 09/08/2023  7:14 AM EST ----- Please Inform Patient   Labs are normal x elevated sodium Lets have her take her furosemide  only in the AM, ie stop pm dose And we will need to followup her 1) symptoms and sodium)   Lets have her see Cadence in about 4 weeks    Thanks

## 2023-09-16 NOTE — Telephone Encounter (Signed)
 Spoke with pt and advised pt per Dr Fernande labs are normal except elevated sodium.  Pt advised to only take Furosemide  in the morning and not in the evening.  Dr Fernande recommends pt see Cadence Furth in the office in 4 weeks and will have scheduling contact her to set up this appointment.  Pt verbalizes understanding and agrees with current plan.

## 2023-09-18 ENCOUNTER — Ambulatory Visit: Payer: Medicare Other | Admitting: Internal Medicine

## 2023-09-23 ENCOUNTER — Ambulatory Visit: Payer: Self-pay

## 2023-09-23 NOTE — Telephone Encounter (Signed)
 Pt called back. Pt given instruction written by Dr Judithann Graves  for diarrhea and cold sx pt read back instructions correctly.

## 2023-09-23 NOTE — Telephone Encounter (Signed)
 Summary: Diarrhea, possible med reaction   Pt has diarrhea and is very weak, she believes this is a reaction to medicine Benzonatate          Chief Complaint: Pt. Has cold symptoms that started Saturday and diarrhea started today. Has taken Mucinex  that helped a little. Declines OV, My husband is sick too. Asking what to take for cold and diarrhea. Symptoms: Above Frequency: Saturday Pertinent Negatives: Patient denies  Disposition: [] ED /[] Urgent Care (no appt availability in office) / [] Appointment(In office/virtual)/ []  [place] [facility]/ [] Home Care/ [] Refused Recommended Disposition /[] [facility]/ [x]  Follow-up with PCP Additional Notes: Please advise pt.  Reason for Disposition  [1] SEVERE diarrhea (e.g., 7 or more times / day more than normal) AND [2] age > 60 years  Answer Assessment - Initial Assessment Questions 1. DIARRHEA SEVERITY: How bad is the diarrhea? How many more stools have you had in the past 24 hours than normal?    - NO DIARRHEA (SCALE 0)   - MILD (SCALE 1-3): Few loose or mushy BMs; increase of 1-3 stools over normal daily number of stools; mild increase in ostomy output.   -  MODERATE (SCALE 4-7): Increase of 4-6 stools daily over normal; moderate increase in ostomy output.   -  SEVERE (SCALE 8-10; OR WORST POSSIBLE): Increase of 7 or more stools daily over normal; moderate increase in ostomy output; incontinence.     5-6 2. ONSET: When did the diarrhea begin?      Saturday 3. BM CONSISTENCY: How loose or watery is the diarrhea?      Watery 4. VOMITING: Are you also vomiting? If Yes, ask: How many times in the past 24 hours?      No 5. ABDOMEN PAIN: Are you having any abdomen pain? If Yes, ask: What does it feel like? (e.g., crampy, dull, intermittent, constant)      Sore 6. ABDOMEN PAIN SEVERITY: If present, ask: How bad is the pain?  (e.g., Scale 1-10; mild, moderate, or severe)   - MILD (1-3): doesn't interfere  with normal activities, abdomen soft and not tender to touch    - MODERATE (4-7): interferes with normal activities or awakens from sleep, abdomen tender to touch    - SEVERE (8-10): excruciating pain, doubled over, unable to do any normal activities       Mild 7. ORAL INTAKE: If vomiting, Have you been able to drink liquids? How much liquids have you had in the past 24 hours?     Yes 8. HYDRATION: Any signs of dehydration? (e.g., dry mouth [not just dry lips], too weak to stand, dizziness, new weight loss) When did you last urinate?     No 9. EXPOSURE: Have you traveled to a foreign country recently? Have you been exposed to anyone with diarrhea? Could you have eaten any food that was spoiled?     No 10. ANTIBIOTIC USE: Are you taking antibiotics now or have you taken antibiotics in the past 2 months?       No 11. OTHER SYMPTOMS: Do you have any other symptoms? (e.g., fever, blood in stool)       No 12. PREGNANCY: Is there any chance you are pregnant? When was your last menstrual period?       No  Protocols used: Diarrhea-A-AH

## 2023-09-24 ENCOUNTER — Inpatient Hospital Stay
Admission: EM | Admit: 2023-09-24 | Discharge: 2023-10-01 | DRG: 193 | Disposition: A | Discharge status: Home Health | Payer: Medicare Other | Attending: Internal Medicine | Admitting: Internal Medicine

## 2023-09-24 ENCOUNTER — Other Ambulatory Visit: Payer: Self-pay

## 2023-09-24 ENCOUNTER — Encounter: Payer: Self-pay | Admitting: Emergency Medicine

## 2023-09-24 ENCOUNTER — Emergency Department: Payer: Medicare Other

## 2023-09-24 DIAGNOSIS — Z8679 Personal history of other diseases of the circulatory system: Secondary | ICD-10-CM

## 2023-09-24 DIAGNOSIS — Z860100 Personal history of colon polyps, unspecified: Secondary | ICD-10-CM

## 2023-09-24 DIAGNOSIS — Z79890 Hormone replacement therapy (postmenopausal): Secondary | ICD-10-CM

## 2023-09-24 DIAGNOSIS — E039 Hypothyroidism, unspecified: Secondary | ICD-10-CM | POA: Diagnosis present

## 2023-09-24 DIAGNOSIS — Z90710 Acquired absence of both cervix and uterus: Secondary | ICD-10-CM

## 2023-09-24 DIAGNOSIS — J101 Influenza due to other identified influenza virus with other respiratory manifestations: Secondary | ICD-10-CM | POA: Diagnosis not present

## 2023-09-24 DIAGNOSIS — Z79899 Other long term (current) drug therapy: Secondary | ICD-10-CM

## 2023-09-24 DIAGNOSIS — I10 Essential (primary) hypertension: Secondary | ICD-10-CM | POA: Diagnosis present

## 2023-09-24 DIAGNOSIS — Z9861 Coronary angioplasty status: Secondary | ICD-10-CM

## 2023-09-24 DIAGNOSIS — K219 Gastro-esophageal reflux disease without esophagitis: Secondary | ICD-10-CM | POA: Diagnosis present

## 2023-09-24 DIAGNOSIS — Z9011 Acquired absence of right breast and nipple: Secondary | ICD-10-CM

## 2023-09-24 DIAGNOSIS — Z923 Personal history of irradiation: Secondary | ICD-10-CM

## 2023-09-24 DIAGNOSIS — I442 Atrioventricular block, complete: Secondary | ICD-10-CM | POA: Diagnosis present

## 2023-09-24 DIAGNOSIS — Z803 Family history of malignant neoplasm of breast: Secondary | ICD-10-CM

## 2023-09-24 DIAGNOSIS — I480 Paroxysmal atrial fibrillation: Secondary | ICD-10-CM | POA: Diagnosis present

## 2023-09-24 DIAGNOSIS — R7989 Other specified abnormal findings of blood chemistry: Secondary | ICD-10-CM

## 2023-09-24 DIAGNOSIS — I429 Cardiomyopathy, unspecified: Secondary | ICD-10-CM | POA: Diagnosis present

## 2023-09-24 DIAGNOSIS — Z951 Presence of aortocoronary bypass graft: Secondary | ICD-10-CM

## 2023-09-24 DIAGNOSIS — R0602 Shortness of breath: Secondary | ICD-10-CM | POA: Diagnosis not present

## 2023-09-24 DIAGNOSIS — J441 Chronic obstructive pulmonary disease with (acute) exacerbation: Principal | ICD-10-CM | POA: Diagnosis present

## 2023-09-24 DIAGNOSIS — Z1152 Encounter for screening for COVID-19: Secondary | ICD-10-CM

## 2023-09-24 DIAGNOSIS — Z823 Family history of stroke: Secondary | ICD-10-CM

## 2023-09-24 DIAGNOSIS — R000 Tachycardia, unspecified: Secondary | ICD-10-CM | POA: Diagnosis present

## 2023-09-24 DIAGNOSIS — Z9621 Cochlear implant status: Secondary | ICD-10-CM | POA: Diagnosis present

## 2023-09-24 DIAGNOSIS — Z9889 Other specified postprocedural states: Secondary | ICD-10-CM

## 2023-09-24 DIAGNOSIS — D638 Anemia in other chronic diseases classified elsewhere: Secondary | ICD-10-CM | POA: Diagnosis present

## 2023-09-24 DIAGNOSIS — Z9981 Dependence on supplemental oxygen: Secondary | ICD-10-CM

## 2023-09-24 DIAGNOSIS — Z66 Do not resuscitate: Secondary | ICD-10-CM | POA: Diagnosis present

## 2023-09-24 DIAGNOSIS — Z87891 Personal history of nicotine dependence: Secondary | ICD-10-CM

## 2023-09-24 DIAGNOSIS — Z888 Allergy status to other drugs, medicaments and biological substances status: Secondary | ICD-10-CM

## 2023-09-24 DIAGNOSIS — Z950 Presence of cardiac pacemaker: Secondary | ICD-10-CM | POA: Insufficient documentation

## 2023-09-24 DIAGNOSIS — I110 Hypertensive heart disease with heart failure: Secondary | ICD-10-CM | POA: Diagnosis present

## 2023-09-24 DIAGNOSIS — I5042 Chronic combined systolic (congestive) and diastolic (congestive) heart failure: Secondary | ICD-10-CM | POA: Diagnosis present

## 2023-09-24 DIAGNOSIS — J9621 Acute and chronic respiratory failure with hypoxia: Secondary | ICD-10-CM | POA: Diagnosis present

## 2023-09-24 DIAGNOSIS — I272 Pulmonary hypertension, unspecified: Secondary | ICD-10-CM | POA: Diagnosis present

## 2023-09-24 DIAGNOSIS — Z880 Allergy status to penicillin: Secondary | ICD-10-CM

## 2023-09-24 DIAGNOSIS — I2489 Other forms of acute ischemic heart disease: Secondary | ICD-10-CM | POA: Diagnosis present

## 2023-09-24 DIAGNOSIS — E785 Hyperlipidemia, unspecified: Secondary | ICD-10-CM | POA: Diagnosis present

## 2023-09-24 DIAGNOSIS — R5381 Other malaise: Secondary | ICD-10-CM | POA: Insufficient documentation

## 2023-09-24 DIAGNOSIS — Z7901 Long term (current) use of anticoagulants: Secondary | ICD-10-CM

## 2023-09-24 DIAGNOSIS — E119 Type 2 diabetes mellitus without complications: Secondary | ICD-10-CM | POA: Diagnosis present

## 2023-09-24 DIAGNOSIS — R54 Age-related physical debility: Secondary | ICD-10-CM | POA: Diagnosis present

## 2023-09-24 DIAGNOSIS — Z853 Personal history of malignant neoplasm of breast: Secondary | ICD-10-CM

## 2023-09-24 DIAGNOSIS — Z953 Presence of xenogenic heart valve: Secondary | ICD-10-CM

## 2023-09-24 DIAGNOSIS — J111 Influenza due to unidentified influenza virus with other respiratory manifestations: Secondary | ICD-10-CM

## 2023-09-24 DIAGNOSIS — I482 Chronic atrial fibrillation, unspecified: Secondary | ICD-10-CM | POA: Diagnosis present

## 2023-09-24 DIAGNOSIS — I2510 Atherosclerotic heart disease of native coronary artery without angina pectoris: Secondary | ICD-10-CM | POA: Diagnosis present

## 2023-09-24 DIAGNOSIS — D649 Anemia, unspecified: Secondary | ICD-10-CM | POA: Insufficient documentation

## 2023-09-24 DIAGNOSIS — Z8249 Family history of ischemic heart disease and other diseases of the circulatory system: Secondary | ICD-10-CM

## 2023-09-24 DIAGNOSIS — I5022 Chronic systolic (congestive) heart failure: Secondary | ICD-10-CM | POA: Diagnosis present

## 2023-09-24 DIAGNOSIS — D696 Thrombocytopenia, unspecified: Secondary | ICD-10-CM | POA: Insufficient documentation

## 2023-09-24 DIAGNOSIS — E876 Hypokalemia: Secondary | ICD-10-CM | POA: Diagnosis present

## 2023-09-24 DIAGNOSIS — Z8701 Personal history of pneumonia (recurrent): Secondary | ICD-10-CM

## 2023-09-24 LAB — URINALYSIS, ROUTINE W REFLEX MICROSCOPIC
Bilirubin Urine: NEGATIVE
Glucose, UA: NEGATIVE mg/dL
Ketones, ur: NEGATIVE mg/dL
Leukocytes,Ua: NEGATIVE
Nitrite: NEGATIVE
Protein, ur: 30 mg/dL — AB
Specific Gravity, Urine: 1.018 (ref 1.005–1.030)
pH: 5 (ref 5.0–8.0)

## 2023-09-24 LAB — BASIC METABOLIC PANEL
Anion gap: 13 (ref 5–15)
BUN: 21 mg/dL (ref 8–23)
CO2: 33 mmol/L — ABNORMAL HIGH (ref 22–32)
Calcium: 8.6 mg/dL — ABNORMAL LOW (ref 8.9–10.3)
Chloride: 91 mmol/L — ABNORMAL LOW (ref 98–111)
Creatinine, Ser: 0.92 mg/dL (ref 0.44–1.00)
GFR, Estimated: >60 mL/min (ref >60)
Glucose, Bld: 152 mg/dL — ABNORMAL HIGH (ref 70–99)
Potassium: 3.8 mmol/L (ref 3.5–5.1)
Sodium: 137 mmol/L (ref 135–145)

## 2023-09-24 LAB — CBC
HCT: 32.3 % — ABNORMAL LOW (ref 36.0–46.0)
Hemoglobin: 10.7 g/dL — ABNORMAL LOW (ref 12.0–15.0)
MCH: 33.9 pg (ref 26.0–34.0)
MCHC: 33.1 g/dL (ref 30.0–36.0)
MCV: 102.2 fL — ABNORMAL HIGH (ref 80.0–100.0)
Platelets: 109 10*3/uL — ABNORMAL LOW (ref 150–400)
RBC: 3.16 MIL/uL — ABNORMAL LOW (ref 3.87–5.11)
RDW: 13.7 % (ref 11.5–15.5)
WBC: 6.9 10*3/uL (ref 4.0–10.5)
nRBC: 0 % (ref 0.0–0.2)

## 2023-09-24 LAB — PROTIME-INR
INR: 2.8 — ABNORMAL HIGH (ref 0.8–1.2)
Prothrombin Time: 29.7 s — ABNORMAL HIGH (ref 11.4–15.2)

## 2023-09-24 LAB — TROPONIN I (HIGH SENSITIVITY)
Troponin I (High Sensitivity): 23 ng/L — ABNORMAL HIGH (ref <18)
Troponin I (High Sensitivity): 25 ng/L — ABNORMAL HIGH

## 2023-09-24 LAB — BRAIN NATRIURETIC PEPTIDE: B Natriuretic Peptide: 2019.9 pg/mL — ABNORMAL HIGH (ref 0.0–100.0)

## 2023-09-24 MED ORDER — LEVOFLOXACIN IN D5W 750 MG/150ML IV SOLN
750.0000 mg | Freq: Once | INTRAVENOUS | Status: AC
  Administered 2023-09-25: 750 mg via INTRAVENOUS
  Filled 2023-09-24: qty 150

## 2023-09-24 MED ORDER — IPRATROPIUM-ALBUTEROL 0.5-2.5 (3) MG/3ML IN SOLN
9.0000 mL | Freq: Once | RESPIRATORY_TRACT | Status: AC
  Administered 2023-09-25: 9 mL via RESPIRATORY_TRACT
  Filled 2023-09-24: qty 3

## 2023-09-24 MED ORDER — IPRATROPIUM-ALBUTEROL 0.5-2.5 (3) MG/3ML IN SOLN
3.0000 mL | Freq: Once | RESPIRATORY_TRACT | Status: AC
  Administered 2023-09-24: 3 mL via RESPIRATORY_TRACT
  Filled 2023-09-24: qty 3

## 2023-09-24 MED ORDER — METHYLPREDNISOLONE SODIUM SUCC 125 MG IJ SOLR
125.0000 mg | Freq: Once | INTRAMUSCULAR | Status: AC
  Administered 2023-09-25: 125 mg via INTRAVENOUS
  Filled 2023-09-24: qty 2

## 2023-09-24 NOTE — ED Triage Notes (Signed)
 Patient brought in by [facility] tonight from home with complaints of worsening shortness of breath. Patient has hx of COPD, normally on 2L [place] bumped to 3L [place] with some improvement. Patient does not have her inhalers at home currently. She does state she also feels weak for the past couple of days.

## 2023-09-24 NOTE — ED Provider Triage Note (Signed)
 Emergency Medicine Provider Triage Evaluation Note  [name] , a 89 y.o. female  was evaluated in triage.  Pt complains of worsening shortness of breath, using more oxygen at home from 2 L to 3 L.  Review of Systems  Positive:  Negative:   Physical Exam  BP (!) 148/66   Pulse 62   Temp 98.3 F (36.8 C) (Oral)   Resp 20   Ht 5\' 1"  (1.549 m)   Wt 48.3 kg   SpO2 98%   BMI 20.12 kg/m  Gen:   Awake, with mild respiratory distress Resp:  Normal effort inspiratory wheezing MSK:   Moves extremities without difficulty  Other:    Medical Decision Making  Medically screening exam initiated at 7:23 PM.  Appropriate orders placed.  [name] was informed that the remainder of the evaluation will be completed by another provider, this initial triage assessment does not replace that evaluation, and the importance of remaining in the ED until their evaluation is complete.  Patient itching wheezing, ordered breathing treatment   Awilda Lennox, PA-C 09/24/23 1925

## 2023-09-24 NOTE — ED Provider Notes (Signed)
[facility] Provider Note    Event Date/Time   First MD Initiated Contact with Patient 09/24/23 2313     (approximate)   History   Shortness of Breath   HPI  [name] is a 89 y.o. female   Past medical history of COPD on 2 L nasal cannula at baseline, atrial fibrillation on Coumadin, CAD, heart failure, hypertension and hyperlipidemia, hypothyroid, pulmonary hypertension who presents to the emergency department with shortness of breath.  Has had cold-like symptoms over the last several days cough and congestion.  Has been wheezing.  She denies chest pain.  She has been compliant with her Coumadin.  She denies any swelling.   External Medical Documents Reviewed: Telephone encounters with her primary doctor over the last several days documenting viral URI symptoms      Physical Exam   Triage Vital Signs: ED Triage Vitals [09/24/23 1919]  Encounter Vitals Group     BP (!) 148/66     Systolic BP Percentile      Diastolic BP Percentile      Pulse Rate 62     Resp 20     Temp 98.3 F (36.8 C)     Temp Source Oral     SpO2 98 %     Weight 106 lb 7.7 oz (48.3 kg)     Height 5\' 1"  (1.549 m)     Head Circumference      Peak Flow      Pain Score 0     Pain Loc      Pain Education      Exclude from Growth Chart     Most recent vital signs: Vitals:   09/25/23 0500 09/25/23 0503  BP: (!) 142/55   Pulse: 75   Resp:    Temp:  98.3 F (36.8 C)  SpO2: 93%     General: Awake, no distress.  CV:  Good peripheral perfusion.  Resp:  Normal effort.  Abd:  No distention.  Other:  Slightly hypoxemic on 2 L nasal cannula so required an increase to 3 L with improvement in oxygenation.  Wheezing without focality.  No significant peripheral edema.  Slight increased work of breathing.  Tachypneic.   ED Results / Procedures / Treatments   Labs (all labs ordered are listed, but only abnormal results are displayed) Labs Reviewed  RESP PANEL BY  RT-PCR (RSV, FLU A&B, COVID)  RVPGX2 - Abnormal; Notable for the following components:      Result Value   Influenza A by PCR POSITIVE (*)    All other components within normal limits  BASIC METABOLIC PANEL - Abnormal; Notable for the following components:   Chloride 91 (*)    CO2 33 (*)    Glucose, Bld 152 (*)    Calcium 8.6 (*)    All other components within normal limits  CBC - Abnormal; Notable for the following components:   RBC 3.16 (*)    Hemoglobin 10.7 (*)    HCT 32.3 (*)    MCV 102.2 (*)    Platelets 109 (*)    All other components within normal limits  URINALYSIS, ROUTINE W REFLEX MICROSCOPIC - Abnormal; Notable for the following components:   Color, Urine YELLOW (*)    APPearance HAZY (*)    Hgb urine dipstick MODERATE (*)    Protein, ur 30 (*)    Bacteria, UA RARE (*)    All other components within normal limits  BRAIN NATRIURETIC  PEPTIDE - Abnormal; Notable for the following components:   B Natriuretic Peptide 2,019.9 (*)    All other components within normal limits  PROTIME-INR - Abnormal; Notable for the following components:   Prothrombin Time 29.7 (*)    INR 2.8 (*)    All other components within normal limits  BASIC METABOLIC PANEL - Abnormal; Notable for the following components:   Potassium 2.7 (*)    Chloride 93 (*)    Glucose, Bld 218 (*)    Calcium 8.4 (*)    All other components within normal limits  CBC - Abnormal; Notable for the following components:   RBC 2.97 (*)    Hemoglobin 10.2 (*)    HCT 30.7 (*)    MCV 103.4 (*)    MCH 34.3 (*)    Platelets 100 (*)    All other components within normal limits  PROTIME-INR - Abnormal; Notable for the following components:   Prothrombin Time 33.1 (*)    INR 3.2 (*)    All other components within normal limits  BLOOD GAS, ARTERIAL - Abnormal; Notable for the following components:   pH, Arterial 7.46 (*)    pCO2 arterial 50 (*)    pO2, Arterial 68 (*)    Bicarbonate 35.6 (*)    Acid-Base Excess  10.1 (*)    All other components within normal limits  TROPONIN I (HIGH SENSITIVITY) - Abnormal; Notable for the following components:   Troponin I (High Sensitivity) 23 (*)    All other components within normal limits  TROPONIN I (HIGH SENSITIVITY) - Abnormal; Notable for the following components:   Troponin I (High Sensitivity) 25 (*)    All other components within normal limits  PROCALCITONIN  MAGNESIUM     I ordered and reviewed the above labs they are notable for white blood cell count is normal and her initial troponin is 23.  She is positive for influenza  EKG  ED ECG REPORT I, Pilar Jarvis, the attending physician, personally viewed and interpreted this ECG.   Date: 09/24/2023  EKG Time: 1926  Rate: 66  Rhythm: v paced  Intervals:none  ST&T Change: no stemi    RADIOLOGY I independently reviewed and interpreted chest x-ray and I see no obvious focality or pneumothorax I also reviewed radiologist's formal read.   PROCEDURES:  Critical Care performed: Yes, see critical care procedure note(s)  .Critical Care  Performed by: Pilar Jarvis, MD Authorized by: Pilar Jarvis, MD   Critical care provider statement:    Critical care time (minutes):  30   Critical care was time spent personally by me on the following activities:  Development of treatment plan with patient or surrogate, discussions with consultants, evaluation of patient's response to treatment, examination of patient, ordering and review of laboratory studies, ordering and review of radiographic studies, ordering and performing treatments and interventions, pulse oximetry, re-evaluation of patient's condition and review of old charts    MEDICATIONS ORDERED IN ED: Medications  oseltamivir (TAMIFLU) capsule 30 mg (30 mg Oral Given 09/25/23 0249)  guaiFENesin (MUCINEX) 12 hr tablet 600 mg (600 mg Oral Given 09/25/23 0248)  amiodarone (PACERONE) tablet 200 mg (has no administration in time range)  bisoprolol  (ZEBETA) tablet 2.5 mg (has no administration in time range)  furosemide (LASIX) tablet 40 mg (has no administration in time range)  sacubitril-valsartan (ENTRESTO) 49-51 mg per tablet (1 tablet Oral Given 09/25/23 0248)  simvastatin (ZOCOR) tablet 10 mg (has no administration in time range)  spironolactone (ALDACTONE)  tablet 25 mg (has no administration in time range)  docusate sodium (COLACE) capsule 100 mg (has no administration in time range)  gabapentin (NEURONTIN) capsule 200 mg (200 mg Oral Given 09/25/23 0248)  diphenhydrAMINE (BENADRYL) capsule 25 mg (has no administration in time range)  acetaminophen (TYLENOL) tablet 650 mg (has no administration in time range)    Or  acetaminophen (TYLENOL) suppository 650 mg (has no administration in time range)  predniSONE (DELTASONE) tablet 40 mg (has no administration in time range)  ipratropium-albuterol (DUONEB) 0.5-2.5 (3) MG/3ML nebulizer solution 3 mL (3 mLs Nebulization Given 09/25/23 0526)  albuterol (PROVENTIL) (2.5 MG/3ML) 0.083% nebulizer solution 2.5 mg (has no administration in time range)  warfarin (COUMADIN) tablet 2.5 mg (has no administration in time range)  Warfarin - Pharmacist Dosing Inpatient (has no administration in time range)  warfarin (COUMADIN) tablet 1.25 mg (has no administration in time range)  potassium chloride SA (KLOR-CON M) CR tablet 40 mEq (has no administration in time range)  potassium chloride 10 mEq in 100 mL IVPB (has no administration in time range)  ipratropium-albuterol (DUONEB) 0.5-2.5 (3) MG/3ML nebulizer solution 3 mL (3 mLs Nebulization Given 09/24/23 1927)  ipratropium-albuterol (DUONEB) 0.5-2.5 (3) MG/3ML nebulizer solution 9 mL (9 mLs Nebulization Given 09/25/23 0031)  methylPREDNISolone sodium succinate (SOLU-MEDROL) 125 mg/2 mL injection 125 mg (125 mg Intravenous Given 09/25/23 0031)  levofloxacin (LEVAQUIN) IVPB 750 mg (0 mg Intravenous Stopped 09/25/23 0205)  morphine (PF) 2 MG/ML injection 2 mg  (2 mg Intravenous Given 09/25/23 0422)    External physician / consultants:  I spoke with hospital medicine for admission and regarding care plan for this patient.   IMPRESSION / MDM / ASSESSMENT AND PLAN / ED COURSE  I reviewed the triage vital signs and the nursing notes.                                Patient's presentation is most consistent with acute presentation with potential threat to life or bodily function.  Differential diagnosis includes, but is not limited to, viral URI, bacterial pneumonia, COPD exacerbation, CHF exacerbation, PE, ACS   The patient is on the cardiac monitor to evaluate for evidence of arrhythmia and/or significant heart rate changes.  MDM:    Is a patient with COPD exacerbation with wheezing on auscultation and found to have influenza.  While workup was ongoing, given her obvious COPD exacerbation, increased work of breathing, I treated her empirically for commune acquired pneumonia with Levaquin.  Then we found out that she was influenza positive.  She had an increase of her oxygen requirement to maintain saturations in the 92 to 93% range, 3 L now from the home baseline of 2 L.  I doubt PE given therapeutic INR and obvious COPD exacerbation in the setting of influenza.    She is stable for admission to hospital medicine.         FINAL CLINICAL IMPRESSION(S) / ED DIAGNOSES   Final diagnoses:  COPD exacerbation (HCC)  Influenza     Rx / DC Orders   ED Discharge Orders     None        Note:  This document was prepared using Dragon voice recognition software and may include unintentional dictation errors.    Pilar Jarvis, MD 09/25/23 [phone]

## 2023-09-25 ENCOUNTER — Other Ambulatory Visit: Payer: Self-pay

## 2023-09-25 DIAGNOSIS — J9621 Acute and chronic respiratory failure with hypoxia: Secondary | ICD-10-CM

## 2023-09-25 DIAGNOSIS — Z953 Presence of xenogenic heart valve: Secondary | ICD-10-CM | POA: Diagnosis not present

## 2023-09-25 DIAGNOSIS — D649 Anemia, unspecified: Secondary | ICD-10-CM | POA: Insufficient documentation

## 2023-09-25 DIAGNOSIS — J441 Chronic obstructive pulmonary disease with (acute) exacerbation: Secondary | ICD-10-CM

## 2023-09-25 DIAGNOSIS — I5042 Chronic combined systolic (congestive) and diastolic (congestive) heart failure: Secondary | ICD-10-CM | POA: Diagnosis present

## 2023-09-25 DIAGNOSIS — Z950 Presence of cardiac pacemaker: Secondary | ICD-10-CM | POA: Insufficient documentation

## 2023-09-25 DIAGNOSIS — D696 Thrombocytopenia, unspecified: Secondary | ICD-10-CM | POA: Insufficient documentation

## 2023-09-25 DIAGNOSIS — E119 Type 2 diabetes mellitus without complications: Secondary | ICD-10-CM | POA: Diagnosis present

## 2023-09-25 DIAGNOSIS — R000 Tachycardia, unspecified: Secondary | ICD-10-CM | POA: Diagnosis present

## 2023-09-25 DIAGNOSIS — I480 Paroxysmal atrial fibrillation: Secondary | ICD-10-CM | POA: Diagnosis present

## 2023-09-25 DIAGNOSIS — Z66 Do not resuscitate: Secondary | ICD-10-CM | POA: Diagnosis present

## 2023-09-25 DIAGNOSIS — J101 Influenza due to other identified influenza virus with other respiratory manifestations: Secondary | ICD-10-CM

## 2023-09-25 DIAGNOSIS — I429 Cardiomyopathy, unspecified: Secondary | ICD-10-CM | POA: Diagnosis present

## 2023-09-25 DIAGNOSIS — I2489 Other forms of acute ischemic heart disease: Secondary | ICD-10-CM | POA: Diagnosis present

## 2023-09-25 DIAGNOSIS — E785 Hyperlipidemia, unspecified: Secondary | ICD-10-CM | POA: Diagnosis present

## 2023-09-25 DIAGNOSIS — I2510 Atherosclerotic heart disease of native coronary artery without angina pectoris: Secondary | ICD-10-CM | POA: Diagnosis present

## 2023-09-25 DIAGNOSIS — Z1152 Encounter for screening for COVID-19: Secondary | ICD-10-CM | POA: Diagnosis not present

## 2023-09-25 DIAGNOSIS — E876 Hypokalemia: Secondary | ICD-10-CM | POA: Diagnosis present

## 2023-09-25 DIAGNOSIS — D638 Anemia in other chronic diseases classified elsewhere: Secondary | ICD-10-CM | POA: Diagnosis present

## 2023-09-25 DIAGNOSIS — Z9621 Cochlear implant status: Secondary | ICD-10-CM | POA: Diagnosis present

## 2023-09-25 DIAGNOSIS — I272 Pulmonary hypertension, unspecified: Secondary | ICD-10-CM | POA: Diagnosis present

## 2023-09-25 DIAGNOSIS — I110 Hypertensive heart disease with heart failure: Secondary | ICD-10-CM | POA: Diagnosis present

## 2023-09-25 DIAGNOSIS — R0602 Shortness of breath: Secondary | ICD-10-CM | POA: Diagnosis present

## 2023-09-25 DIAGNOSIS — I442 Atrioventricular block, complete: Secondary | ICD-10-CM | POA: Diagnosis present

## 2023-09-25 DIAGNOSIS — Z7901 Long term (current) use of anticoagulants: Secondary | ICD-10-CM

## 2023-09-25 DIAGNOSIS — R7989 Other specified abnormal findings of blood chemistry: Secondary | ICD-10-CM

## 2023-09-25 DIAGNOSIS — R5381 Other malaise: Secondary | ICD-10-CM | POA: Insufficient documentation

## 2023-09-25 DIAGNOSIS — I482 Chronic atrial fibrillation, unspecified: Secondary | ICD-10-CM | POA: Diagnosis present

## 2023-09-25 DIAGNOSIS — E039 Hypothyroidism, unspecified: Secondary | ICD-10-CM | POA: Diagnosis present

## 2023-09-25 HISTORY — DX: Influenza due to other identified influenza virus with other respiratory manifestations: J10.1

## 2023-09-25 LAB — MAGNESIUM: Magnesium: 2 mg/dL (ref 1.7–2.4)

## 2023-09-25 LAB — CBC
HCT: 30.7 % — ABNORMAL LOW (ref 36.0–46.0)
Hemoglobin: 10.2 g/dL — ABNORMAL LOW (ref 12.0–15.0)
MCH: 34.3 pg — ABNORMAL HIGH (ref 26.0–34.0)
MCHC: 33.2 g/dL (ref 30.0–36.0)
MCV: 103.4 fL — ABNORMAL HIGH (ref 80.0–100.0)
Platelets: 100 10*3/uL — ABNORMAL LOW (ref 150–400)
RBC: 2.97 MIL/uL — ABNORMAL LOW (ref 3.87–5.11)
RDW: 13.6 % (ref 11.5–15.5)
WBC: 6.6 10*3/uL (ref 4.0–10.5)
nRBC: 0 % (ref 0.0–0.2)

## 2023-09-25 LAB — BLOOD GAS, ARTERIAL
Acid-Base Excess: 10.1 mmol/L — ABNORMAL HIGH (ref 0.0–2.0)
Bicarbonate: 35.6 mmol/L — ABNORMAL HIGH (ref 20.0–28.0)
Delivery systems: POSITIVE
FIO2: 30 %
O2 Saturation: 95.2 %
PEEP: 5 cmH2O
Patient temperature: 37
Pressure support: 5 cmH2O
pCO2 arterial: 50 mm[Hg] — ABNORMAL HIGH (ref 32–48)
pH, Arterial: 7.46 — ABNORMAL HIGH (ref 7.35–7.45)
pO2, Arterial: 68 mm[Hg] — ABNORMAL LOW (ref 83–108)

## 2023-09-25 LAB — BASIC METABOLIC PANEL
Anion gap: 14 (ref 5–15)
BUN: 19 mg/dL (ref 8–23)
CO2: 30 mmol/L (ref 22–32)
Calcium: 8.4 mg/dL — ABNORMAL LOW (ref 8.9–10.3)
Chloride: 93 mmol/L — ABNORMAL LOW (ref 98–111)
Creatinine, Ser: 0.71 mg/dL (ref 0.44–1.00)
GFR, Estimated: >60 mL/min (ref >60)
Glucose, Bld: 218 mg/dL — ABNORMAL HIGH (ref 70–99)
Potassium: 2.7 mmol/L — CL (ref 3.5–5.1)
Sodium: 137 mmol/L (ref 135–145)

## 2023-09-25 LAB — RESP PANEL BY RT-PCR (RSV, FLU A&B, COVID)  RVPGX2
Influenza A by PCR: POSITIVE — AB
Influenza B by PCR: NEGATIVE
Resp Syncytial Virus by PCR: NEGATIVE
SARS Coronavirus 2 by RT PCR: NEGATIVE

## 2023-09-25 LAB — PROCALCITONIN: Procalcitonin: 0.22 ng/mL

## 2023-09-25 LAB — PROTIME-INR
INR: 3.2 — ABNORMAL HIGH (ref 0.8–1.2)
Prothrombin Time: 33.1 s — ABNORMAL HIGH (ref 11.4–15.2)

## 2023-09-25 MED ORDER — POTASSIUM CHLORIDE CRYS ER 20 MEQ PO TBCR
40.0000 meq | EXTENDED_RELEASE_TABLET | Freq: Once | ORAL | Status: AC
  Administered 2023-09-25: 40 meq via ORAL
  Filled 2023-09-25: qty 2

## 2023-09-25 MED ORDER — GABAPENTIN 100 MG PO CAPS
200.0000 mg | ORAL_CAPSULE | Freq: Every day | ORAL | Status: DC
  Administered 2023-09-25 – 2023-09-30 (×7): 200 mg via ORAL
  Filled 2023-09-25 (×7): qty 2

## 2023-09-25 MED ORDER — PREDNISONE 20 MG PO TABS
40.0000 mg | ORAL_TABLET | Freq: Every day | ORAL | Status: AC
  Administered 2023-09-25 – 2023-09-29 (×5): 40 mg via ORAL
  Filled 2023-09-25 (×5): qty 2

## 2023-09-25 MED ORDER — DIPHENHYDRAMINE HCL 25 MG PO CAPS: 25.0000 mg | ORAL_CAPSULE | Freq: Four times a day (QID) | ORAL | Status: DC | PRN

## 2023-09-25 MED ORDER — SIMVASTATIN 20 MG PO TABS
10.0000 mg | ORAL_TABLET | Freq: Every day | ORAL | Status: DC
  Administered 2023-09-25 – 2023-09-30 (×6): 10 mg via ORAL
  Filled 2023-09-25 (×6): qty 1

## 2023-09-25 MED ORDER — IPRATROPIUM-ALBUTEROL 0.5-2.5 (3) MG/3ML IN SOLN
3.0000 mL | Freq: Four times a day (QID) | RESPIRATORY_TRACT | Status: DC
  Administered 2023-09-25 (×3): 3 mL via RESPIRATORY_TRACT
  Filled 2023-09-25 (×3): qty 3

## 2023-09-25 MED ORDER — BISOPROLOL FUMARATE 5 MG PO TABS
2.5000 mg | ORAL_TABLET | Freq: Every day | ORAL | Status: DC
  Administered 2023-09-25 – 2023-09-27 (×3): 2.5 mg via ORAL
  Filled 2023-09-25 (×5): qty 0.5

## 2023-09-25 MED ORDER — OSELTAMIVIR PHOSPHATE 30 MG PO CAPS
30.0000 mg | ORAL_CAPSULE | Freq: Two times a day (BID) | ORAL | Status: AC
  Administered 2023-09-25 – 2023-09-29 (×9): 30 mg via ORAL
  Filled 2023-09-25 (×12): qty 1

## 2023-09-25 MED ORDER — WARFARIN - PHARMACIST DOSING INPATIENT
Freq: Every day | Status: DC
  Filled 2023-09-25: qty 1

## 2023-09-25 MED ORDER — DOCUSATE SODIUM 100 MG PO CAPS: 100.0000 mg | ORAL_CAPSULE | Freq: Two times a day (BID) | ORAL | Status: DC | PRN

## 2023-09-25 MED ORDER — ACETAMINOPHEN 325 MG PO TABS: 650.0000 mg | ORAL_TABLET | Freq: Four times a day (QID) | ORAL | Status: DC | PRN

## 2023-09-25 MED ORDER — POTASSIUM CHLORIDE 10 MEQ/100ML IV SOLN
10.0000 meq | INTRAVENOUS | Status: AC
  Administered 2023-09-25 (×4): 10 meq via INTRAVENOUS
  Filled 2023-09-25 (×4): qty 100

## 2023-09-25 MED ORDER — ACETAMINOPHEN 650 MG RE SUPP
650.0000 mg | Freq: Four times a day (QID) | RECTAL | Status: DC | PRN
Start: 2023-09-25 — End: 2023-10-01

## 2023-09-25 MED ORDER — ALBUTEROL SULFATE (2.5 MG/3ML) 0.083% IN NEBU: 2.5000 mg | INHALATION_SOLUTION | RESPIRATORY_TRACT | Status: DC | PRN

## 2023-09-25 MED ORDER — GUAIFENESIN ER 600 MG PO TB12
600.0000 mg | ORAL_TABLET | Freq: Two times a day (BID) | ORAL | Status: DC
  Administered 2023-09-25 – 2023-10-01 (×14): 600 mg via ORAL
  Filled 2023-09-25 (×14): qty 1

## 2023-09-25 MED ORDER — MORPHINE SULFATE (PF) 2 MG/ML IV SOLN
2.0000 mg | Freq: Once | INTRAVENOUS | Status: AC
  Administered 2023-09-25: 2 mg via INTRAVENOUS
  Filled 2023-09-25: qty 1

## 2023-09-25 MED ORDER — LORAZEPAM 2 MG/ML IJ SOLN: 0.5000 mg | Freq: Once | INTRAMUSCULAR | Status: DC | PRN

## 2023-09-25 MED ORDER — IPRATROPIUM BROMIDE 0.02 % IN SOLN
0.5000 mg | Freq: Four times a day (QID) | RESPIRATORY_TRACT | Status: DC
  Administered 2023-09-25 – 2023-09-27 (×5): 0.5 mg via RESPIRATORY_TRACT
  Filled 2023-09-25 (×6): qty 2.5

## 2023-09-25 MED ORDER — SACUBITRIL-VALSARTAN 49-51 MG PO TABS
1.0000 | ORAL_TABLET | Freq: Two times a day (BID) | ORAL | Status: DC
  Administered 2023-09-25 – 2023-09-26 (×4): 1 via ORAL
  Filled 2023-09-25 (×5): qty 1

## 2023-09-25 MED ORDER — WARFARIN SODIUM 2.5 MG PO TABS: 2.5000 mg | ORAL_TABLET | ORAL | Status: DC

## 2023-09-25 MED ORDER — WARFARIN 1.25 MG HALF TABLET
1.2500 mg | ORAL_TABLET | ORAL | Status: DC
  Filled 2023-09-25: qty 1

## 2023-09-25 MED ORDER — SPIRONOLACTONE 25 MG PO TABS
25.0000 mg | ORAL_TABLET | Freq: Every day | ORAL | Status: DC
  Administered 2023-09-25 – 2023-10-01 (×7): 25 mg via ORAL
  Filled 2023-09-25 (×7): qty 1

## 2023-09-25 MED ORDER — FUROSEMIDE 40 MG PO TABS
40.0000 mg | ORAL_TABLET | Freq: Every day | ORAL | Status: DC
  Administered 2023-09-25 – 2023-09-27 (×3): 40 mg via ORAL
  Filled 2023-09-25 (×3): qty 1

## 2023-09-25 MED ORDER — LEVALBUTEROL HCL 0.63 MG/3ML IN NEBU
0.6300 mg | INHALATION_SOLUTION | Freq: Four times a day (QID) | RESPIRATORY_TRACT | Status: DC
  Administered 2023-09-25 – 2023-09-27 (×5): 0.63 mg via RESPIRATORY_TRACT
  Filled 2023-09-25 (×8): qty 3

## 2023-09-25 MED ORDER — AMIODARONE HCL 200 MG PO TABS
200.0000 mg | ORAL_TABLET | Freq: Every day | ORAL | Status: DC
  Administered 2023-09-25 – 2023-10-01 (×7): 200 mg via ORAL
  Filled 2023-09-25 (×7): qty 1

## 2023-09-25 NOTE — ED Notes (Signed)
A:COPD   Pilar Jarvis, MD 09/25/23 [phone]

## 2023-09-25 NOTE — Assessment & Plan Note (Addendum)
Elevated troponin Troponin elevated without complaints of chest pain, likely demand ischemia Continue simvastatin and bisoprolol, warfarin Getting baseline EKG

## 2023-09-25 NOTE — H&P (Signed)
History and Physical    Patient: [name]:[id number] DOB: [birth year] DOA: 09/24/2023 DOS: the patient was seen and examined on 09/25/2023 PCP: Reubin Milan, MD  Patient coming from: Home  Chief Complaint:  Chief Complaint  Patient presents with   Shortness of Breath    HPI: [name] is a 89 y.o. female with medical history significant for Stage 4 COPD on home O2 at 2 L, CAD, chronic A-fib on Coumadin, with history of maze procedure, systolic CHF, mitral and tricuspid valve repair, hypothyroidism, diabetes, anemia of chronic disease, who presents to the ED by EMS with worsening shortness of breath leading her to bump her oxygen up to 3 L.  No help with inhalers.  Also developed cough and congestion.  Husband has similar symptoms.  Denies chest pain.  Denies lower extremity pain or swelling. ED course and data Review: Tachypneic to 26 with O2 sat 93% on 3 L, vitals otherwise unremarkable Workup notable for the following findings normal WBC of 6900 Influenza A positive Troponin 25 and BNP 2019.  INR 2.8 Hemoglobin 10.7 near baseline.  Mild thrombocytopenia of 109,000 (baseline 136-141,000). Chest x-ray showed no active disease EKG pending  Patient treated with DuoNebs and Solu-Medrol and was given a dose of Levaquin  Hospitalist consulted for admission.     Past Medical History:  Diagnosis Date   Acute on chronic respiratory failure with hypoxia (HCC) 02/04/2019   Added automatically from request for surgery [phone]   Acute on chronic systolic heart failure (HCC)    Anxiety    Arthritis    "hands" (11/10/2015)   Breast cancer (HCC) 2002   right   CAD s/p CABG x 1    a. 04/2015 [place] to LAD   Chronic combined systolic (congestive) and diastolic (congestive) heart failure (HCC)    a. 01/2015 Echo: EF 50-55%, Gr2 DD (preop valve surgery); b. 08/2015 Echo: EF 30-35%, diff HK. Gr2 DD; c. 09/2016 Echo: EF 50-55%; d. 07/2018 Echo: EF 20-25%; e. 03/2019 Echo: EF 35-40%, diff  HK. Mod red RV fxn, RVSP 34.66mmHg. Mildly dil LA. Nl MV prosthesis. Triv AI.   COPD (chronic obstructive pulmonary disease) (HCC)    Coronary artery disease    GERD (gastroesophageal reflux disease)    History of colon polyps    History of mitral valve replacement    a. 04/2015 s/p 27 mm [facility] Mitral bovine bioprosthetic tissue valve   Hyperlipidemia    Hypertension    Hypothyroidism    Maze operation for AF w/ LAA clipping    a. 04/2015 Complete bilateral atrial lesion set using cryothermy and bipolar radiofrequency ablation with clipping of LA appendage   Meniere disease    Meniere's disease    On home oxygen therapy    "2L at night" (11/10/2015)   PAF (paroxysmal atrial fibrillation) (HCC)    Personal history of radiation therapy    Pneumonia ~ 2010   PONV (postoperative nausea and vomiting)    Pt felt like eardrums were gonna pop   Post-surgical complete heart block, symptomatic    a. 04/2015 s/p MDT [id number] Advisa DR MRI DC PPM.   Presence of permanent cardiac pacemaker    Pulmonary hypertension (HCC)    Respiratory failure (HCC) 02/22/2019   Surgical wound, non healing - chest wall 11/10/2015   Tricuspid Regurgitation s/p Repair    a. 04/2015 s/p 26 mm Edwards mc3 ring annuloplasty   Wound infection 10/13/2015   Superficial sternal wound infection  Past Surgical History:  Procedure Laterality Date   APPENDECTOMY     APPLICATION OF A-CELL OF CHEST/ABDOMEN N/A 11/21/2015   Procedure: APPLICATION OF A-CELL OF CHEST/ABDOMEN;  Surgeon: Alena Bills Dillingham, DO;  [facility];  Service: Plastics;  Laterality: N/A;   APPLICATION OF A-CELL OF CHEST/ABDOMEN Right 12/21/2015   Procedure: APPLICATION OF A-CELL OF RIGHT CHEST;  Surgeon: Alena Bills Dillingham, DO;  [facility];  Service: Plastics;  Laterality: Right;   APPLICATION OF A-CELL OF CHEST/ABDOMEN Right 01/11/2016   Procedure: APPLICATION OF A-CELL OF RIGHT CHEST;  Surgeon: Alena Bills Dillingham, DO;  [facility]: [facility];  Service:  Plastics;  Laterality: Right;   APPLICATION OF A-CELL OF CHEST/ABDOMEN Right 02/12/2016   Procedure: APPLICATION OF A-CELL TO RIGHT CHEST WOUND;  Surgeon: Alena Bills Dillingham, DO;  [facility];  Service: Plastics;  Laterality: Right;   APPLICATION OF WOUND VAC N/A 05/09/2015   Procedure: APPLICATION OF WOUND VAC;  Surgeon: Purcell Nails, MD;  [facility];  Service: Thoracic;  Laterality: N/A;   APPLICATION OF WOUND VAC N/A 11/13/2015   Procedure: APPLICATION OF WOUND VAC;  Surgeon: Purcell Nails, MD;  [facility];  Service: Thoracic;  Laterality: N/A;   APPLICATION OF WOUND VAC N/A 11/21/2015   Procedure: APPLICATION OF WOUND VAC;  Surgeon: Alena Bills Dillingham, DO;  [facility];  Service: Plastics;  Laterality: N/A;   APPLICATION OF WOUND VAC Right 12/21/2015   Procedure: APPLICATION OF WOUND VAC to right chest wall ;  Surgeon: Alena Bills Dillingham, DO;  [facility];  Service: Plastics;  Laterality: Right;   APPLICATION OF WOUND VAC Right 01/11/2016   Procedure: APPLICATION OF WOUND VAC RIGHT CHEST ;  Surgeon: Alena Bills Dillingham, DO;  [facility];  Service: Plastics;  Laterality: Right;   APPLICATION OF WOUND VAC Right 01/24/2016   Procedure: APPLICATION OF WOUND VAC RIGHT CHEST WALL;  Surgeon: Alena Bills Dillingham, DO;  [facility];  Service: Plastics;  Laterality: Right;   APPLICATION OF WOUND VAC Right 02/12/2016   Procedure: RE-APPLICATION OF WOUND VAC TO RIGHT CHEST WOUND;  Surgeon: Alena Bills Dillingham, DO;  [facility];  Service: Plastics;  Laterality: Right;   BIV UPGRADE N/A 06/07/2019   Procedure: BIV UPGRADE;  Surgeon: Duke Salvia, MD;  Location[facility] INVASIVE CV LAB;  Service: Cardiovascular;  Laterality: N/A;   BREAST BIOPSY Left 11/25/06   neg   BREAST BIOPSY Left 01/20/12   /clip-neg   CARDIAC CATHETERIZATION  11/2013   [facility]   CARDIAC CATHETERIZATION  10/2014   [facility]   CARDIAC VALVE REPLACEMENT     CARDIOVERSION N/A 07/20/2018   Procedure: CARDIOVERSION;   Surgeon: Iran Ouch, MD;  Location: [facility] ORS;  Service: Cardiovascular;  Laterality: N/A;   CARDIOVERSION N/A 09/18/2020   Procedure: CARDIOVERSION;  Surgeon: Antonieta Iba, MD;  Location: [facility] ORS;  Service: Cardiovascular;  Laterality: N/A;   CATARACT EXTRACTION W/ INTRAOCULAR LENS  IMPLANT, BILATERAL Bilateral 2014   CLIPPING OF ATRIAL APPENDAGE N/A 04/25/2015   Procedure: CLIPPING OF ATRIAL APPENDAGE;  Surgeon: Purcell Nails, MD;  [facility];  Service: Open Heart Surgery;  Laterality: N/A;   COCHLEAR IMPLANT Left 2005?   COLONOSCOPY WITH PROPOFOL N/A 01/20/2018   Procedure: COLONOSCOPY WITH PROPOFOL;  Surgeon: Toledo, Boykin Nearing, MD;  Location: [facility] ENDOSCOPY;  Service: Gastroenterology;  Laterality: N/A;   CORONARY ANGIOPLASTY     CORONARY ARTERY BYPASS GRAFT N/A 04/25/2015  Procedure: CORONARY ARTERY BYPASS GRAFTING (CABG) x ONE, using left internal mammary artery;  Surgeon: Purcell Nails, MD;  [facility];  Service: Open Heart Surgery;  Laterality: N/A;   DILATION AND CURETTAGE OF UTERUS  "several before hysterectomy"   EP IMPLANTABLE DEVICE N/A 05/02/2015   Procedure: Pacemaker Implant;  Surgeon: Hillis Range, MD;  [facility] INV[facility] CV LAB;  Service: Cardiovascular;  Laterality: N/A;   ESOPHAGOGASTRODUODENOSCOPY (EGD) WITH PROPOFOL N/A 01/20/2018   Procedure: ESOPHAGOGASTRODUODENOSCOPY (EGD) WITH PROPOFOL;  Surgeon: Toledo, Boykin Nearing, MD;  Location: [facility] ENDOSCOPY;  Service: Gastroenterology;  Laterality: N/A;   EYE SURGERY     I & D EXTREMITY Right 12/06/2015   Procedure: IRRIGATION AND DEBRIDEMENT RIGHT CHEST WALL WITH ACELL PLACEMENT AND VAC;  Surgeon: Alena Bills Dillingham, DO;  [facility];  Service: Plastics;  Laterality: Right;   INCISION AND DRAINAGE OF WOUND N/A 11/21/2015   Procedure: IRRIGATION AND DEBRIDEMENT WOUND;  Surgeon: Alena Bills Dillingham, DO;  [facility];  Service: Plastics;  Laterality: N/A;   INCISION AND DRAINAGE OF WOUND Right 12/21/2015    Procedure: IRRIGATION AND DEBRIDEMENT right chest wall WOUND;  Surgeon: Alena Bills Dillingham, DO;  [facility];  Service: Plastics;  Laterality: Right;  right chest wall    INCISION AND DRAINAGE OF WOUND Right 01/24/2016   Procedure: IRRIGATION AND DEBRIDEMENT RIGHT CHEST WALL WOUND;  Surgeon: Alena Bills Dillingham, DO;  [facility];  Service: Plastics;  Laterality: Right;   INCISION AND DRAINAGE OF WOUND Right 03/28/2016   Procedure: IRRIGATION AND DEBRIDEMENT RIGHT CHEST WALL WOUND WITH A Cell Placement;  Surgeon: Alena Bills Dillingham, DO;  [facility];  Service: Plastics;  Laterality: Right;   INCISION AND DRAINAGE OF WOUND Right 05/17/2016   Procedure: IRRIGATION AND DEBRIDEMENT OF RIGHT CHEST WOUND WITH A CELL PLACEMENT;  Surgeon: Alena Bills Dillingham, DO;  [facility];  Service: Plastics;  Laterality: Right;   INSERT / REPLACE / REMOVE PACEMAKER     IRRIGATION AND DEBRIDEMENT OF WOUND WITH SPLIT THICKNESS SKIN GRAFT Right 01/11/2016   Procedure: IRRIGATION AND DEBRIDEMENT OF RIGHT CHEST WOUND ;  Surgeon: Alena Bills Dillingham, DO;  [facility];  Service: Plastics;  Laterality: Right;   MASTECTOMY, PARTIAL Right 2002   positive, partial   MAZE N/A 04/25/2015   Procedure: MAZE;  Surgeon: Purcell Nails, MD;  Location[facility] OR;  Service: Open Heart Surgery;  Laterality: N/A;   MITRAL VALVE REPAIR N/A 04/25/2015   Procedure: MITRAL VALVE  REPLACEMENT using a 27 mm Edwards Perimount Magna Mitral Ease Valve;  Surgeon: Purcell Nails, MD;  [facility];  Service: Open Heart Surgery;  Laterality: N/A;   RIGHT HEART CATH N/A 02/08/2019   Procedure: RIGHT HEART CATH;  Surgeon: Iran Ouch, MD;  Location: [facility] INVASIVE CV LAB;  Service: Cardiovascular;  Laterality: N/A;   SKIN SPLIT GRAFT Right 02/12/2016   Procedure: IRRIGATION AND DEBRIDEMENT RIGHT CHEST WOUND;  Surgeon: Alena Bills Dillingham, DO;  [facility];  Service: Plastics;  Laterality: Right;   STERNAL WIRES REMOVAL N/A  06/05/2015   Procedure: STERNAL WIRES REMOVAL;  Surgeon: Purcell Nails, MD;  [facility];  Service: Thoracic;  Laterality: N/A;   STERNAL WOUND DEBRIDEMENT N/A 05/09/2015   Procedure: STERNAL WOUND DEBRIDEMENT;  Surgeon: Purcell Nails, MD;  [facility];  Service: Thoracic;  Laterality: N/A;   STERNAL WOUND DEBRIDEMENT N/A 11/13/2015   Procedure: Excisional drainage of RIGHT Chest wall  mass and breast mass ;  Surgeon: Purcell Nails, MD;  Location: [facility] OR;  Service: Thoracic;  Laterality: N/A;   TEE WITHOUT CARDIOVERSION N/A 04/25/2015   Procedure: TRANSESOPHAGEAL ECHOCARDIOGRAM (TEE);  Surgeon: Purcell Nails, MD;  Location: [facility] OR;  Service: Open Heart Surgery;  Laterality: N/A;   TONSILLECTOMY     TRAM Right 11/18/2015   Procedure: VRAM Vertical Rectus Abdominus Muscle Flap;  Surgeon: Alena Bills Dillingham, DO;  [facility];  Service: Plastics;  Laterality: Right;  RIght Back   TRICUSPID VALVE REPLACEMENT N/A 04/25/2015   Procedure: TRICUSPID VALVE REPAIR;  Surgeon: Purcell Nails, MD;  [facility];  Service: Open Heart Surgery;  Laterality: N/A;   VAGINAL HYSTERECTOMY     Social History:  reports that she quit smoking about 25 years ago. Her smoking use included cigarettes. She started smoking about 65 years ago. She has a 60 pack-year smoking history. She has never used smokeless tobacco. She reports that she does not currently use alcohol. She reports that she does not use drugs.  Allergies  Allergen Reactions   Ace Inhibitors Cough and Other (See Comments)   Amoxicillin-Pot Clavulanate Swelling and Other (See Comments)    SWOLLEN JOINTS  Has patient had a PCN reaction causing immediate rash, facial/tongue/throat swelling, SOB or lightheadedness with hypotension: Yes Has patient had a PCN reaction causing severe rash involving mucus membranes or skin necrosis: No Has patient had a PCN reaction that required hospitalization No Has patient had a PCN reaction occurring within  the last 10 years: No If all of the above answers are "NO", then may proceed with Cephalosporin   Penicillins Other (See Comments) and Swelling    SWOLLEN JOINTS   Has patient had a PCN reaction causing immediate rash, facial/tongue/throat swelling, SOB or lightheadedness with hypotension: Yes  Has patient had a PCN reaction causing severe rash involving mucus membranes or skin necrosis: No  Has patient had a PCN reaction that required hospitalization No  Has patient had a PCN reaction occurring within the last 10 years: No  If all of the above answers are "NO", then may proceed with Cephalosporin use.  Swollen joints  Swollen joints, Has patient had a PCN reaction causing immediate rash, facial/tongue/throat swelling, SOB or lightheadedness with hypotension: Yes, Has patient had a PCN reaction causing severe rash involving mucus membranes or skin necrosis: No, Has patient had a PCN reaction that required hospitalization No, Has patient had a PCN reaction occurring within the last 10 years: No, If all of the above answers are "NO", then may proceed with Cephalosporin use.   Rosuvastatin Other (See Comments)   Nifedipine Other (See Comments)    UNSPECIFIED   Propranolol Other (See Comments)    UNSPECIFIED   Diazepam Other (See Comments)    Made her "hyper"    Meperidine Other (See Comments)    Made her "hyper"     Propoxyphene Other (See Comments)    Made her "hyper"      Family History  Problem Relation Age of Onset   Heart disease Father    Heart disease Brother    CVA Mother    Heart attack Paternal Uncle    Breast cancer Maternal Aunt    Breast cancer Paternal Aunt     Prior to Admission medications   Medication Sig Start Date End Date Taking? Authorizing Provider  acetaminophen (TYLENOL) 500 MG tablet Take 1,000 mg by mouth every 6 (six) hours as needed (for pain.).  [provider]  amiodarone (PACERONE) 200 MG tablet Take 1 tablet (200 mg) 5 days per  week 02/25/23   Duke Salvia, MD  bisoprolol (ZEBETA) 5 MG tablet TAKE 1/2 TABLET BY MOUTH DAILY 08/04/23   Duke Salvia, MD  Cyanocobalamin (VITAMIN B-12 PO) Take 1,000 mcg by mouth daily.    [provider]  diphenhydrAMINE (BENADRYL) 25 mg capsule Take 25 mg by mouth every 6 (six) hours as needed for allergies.    [provider]  docusate sodium (COLACE) 100 MG capsule Take 100 mg by mouth 2 (two) times daily as needed (constipation.).    [provider]  Ferrous Sulfate (IRON PO) Take 1 tablet by mouth daily.    [provider]  furosemide (LASIX) 20 MG tablet TAKE 2 TABLETS (40 MG) IN THE MORNING. 09/16/23   Duke Salvia, MD  gabapentin (NEURONTIN) 100 MG capsule Take 2 capsules (200 mg total) by mouth at bedtime. 01/24/23   Reubin Milan, MD  ipratropium-albuterol (DUONEB) 0.5-2.5 (3) MG/3ML SOLN Inhale 3 mLs into the lungs every 6 (six) hours as needed. 12/26/20   Erin Fulling, MD  levothyroxine (SYNTHROID) 88 MCG tablet TAKE 1 TABLET BY MOUTH EVERY DAY BEFORE BREAKFAST 08/05/23   Reubin Milan, MD  Multiple Vitamins-Minerals (PRESERVISION AREDS 2 PO) Take 1 tablet by mouth 2 (two) times daily.     [provider]  mupirocin ointment (BACTROBAN) 2 % APPLY 1 APPLICATION TOPICALLY 2 (TWO) TIMES DAILY. TO SKIN WOUND 05/04/23   Reubin Milan, MD  OXYGEN Inhale 2 L into the lungs continuous.    [provider]  pantoprazole (PROTONIX) 40 MG tablet TAKE 1 TABLET BY MOUTH TWICE A DAY 05/04/23   Reubin Milan, MD  sacubitril-valsartan [facility]) [phone] MG Take 1 tablet (49/51 mg) by mouth twice daily 03/12/23   Duke Salvia, MD  simvastatin (ZOCOR) 10 MG tablet TAKE 1 TABLET BY MOUTH EVERYDAY AT BEDTIME 08/04/23   Iran Ouch, MD  spironolactone (ALDACTONE) 25 MG tablet TAKE 1 TABLET BY MOUTH EVERY DAY 08/04/23   Iran Ouch, MD  warfarin (COUMADIN) 2.5 MG tablet TAKE 1/2 A TABLET TO 1 TABLET BY MOUTH DAILY AS  DIRECTED BY THE COUMADIN CLINIC. 08/04/23   Iran Ouch, MD    Physical Exam: Vitals:   09/24/23 1919 09/24/23 2305 09/24/23 2331 09/24/23 2338  BP: (!) 148/66 (!) 147/88    Pulse: 62 72  74  Resp: 20 18  (!) 26  Temp: 98.3 F (36.8 C)  98.3 F (36.8 C)   TempSrc: Oral  Oral   SpO2: 98% 100%  93%  Weight: 48.3 kg     Height: 5\' 1"  (1.549 m)      Physical Exam Vitals and nursing note reviewed.  Constitutional:      General: She is not in acute distress.    Comments: Very frail-appearing elderly woman awake and alert, sitting up on bed, conversational dyspnea  HENT:     Head: Normocephalic and atraumatic.  Cardiovascular:     Rate and Rhythm: Normal rate and regular rhythm.     Heart sounds: Normal heart sounds.  Pulmonary:     Effort: Tachypnea and accessory muscle usage present.     Breath sounds: Wheezing and rhonchi present.  Abdominal:     Palpations: Abdomen is soft.     Tenderness: There is no abdominal tenderness.  Neurological:     Mental Status: Mental status is at  baseline.     Labs on Admission: I have personally reviewed following labs and imaging studies  CBC: Recent Labs  Lab 09/24/23 1935  WBC 6.9  HGB 10.7*  HCT 32.3*  MCV 102.2*  PLT 109*   Basic Metabolic Panel: Recent Labs  Lab 09/24/23 1935  NA 137  K 3.8  CL 91*  CO2 33*  GLUCOSE 152*  BUN 21  CREATININE 0.92  CALCIUM 8.6*   GFR: Estimated Creatinine Clearance: 33.1 mL/min (by C-G formula based on SCr of 0.92 mg/dL). Liver Function Tests: No results for input(s): "AST", "ALT", "ALKPHOS", "BILITOT", "PROT", "ALBUMIN" in the last 168 hours. No results for input(s): "LIPASE", "AMYLASE" in the last 168 hours. No results for input(s): "AMMONIA" in the last 168 hours. Coagulation Profile: Recent Labs  Lab 09/24/23 2326  INR 2.8*   Cardiac Enzymes: No results for input(s): "CKTOTAL", "CKMB", "CKMBINDEX", "TROPONINI" in the last 168 hours. BNP (last 3 results) No results  for input(s): "PROBNP" in the last 8760 hours. HbA1C: No results for input(s): "HGBA1C" in the last 72 hours. CBG: No results for input(s): "GLUCAP" in the last 168 hours. Lipid Profile: No results for input(s): "CHOL", "HDL", "LDLCALC", "TRIG", "CHOLHDL", "LDLDIRECT" in the last 72 hours. Thyroid Function Tests: No results for input(s): "TSH", "T4TOTAL", "FREET4", "T3FREE", "THYROIDAB" in the last 72 hours. Anemia Panel: No results for input(s): "VITAMINB12", "FOLATE", "FERRITIN", "TIBC", "IRON", "RETICCTPCT" in the last 72 hours. Urine analysis:    Component Value Date/Time   COLORURINE YELLOW (A) 09/24/2023 1935   APPEARANCEUR HAZY (A) 09/24/2023 1935   LABSPEC 1.018 09/24/2023 1935   PHURINE 5.0 09/24/2023 1935   GLUCOSEU NEGATIVE 09/24/2023 1935   HGBUR MODERATE (A) 09/24/2023 1935   BILIRUBINUR NEGATIVE 09/24/2023 1935   KETONESUR NEGATIVE 09/24/2023 1935   PROTEINUR 30 (A) 09/24/2023 1935   UROBILINOGEN 0.2 04/21/2015 1244   NITRITE NEGATIVE 09/24/2023 1935   LEUKOCYTESUR NEGATIVE 09/24/2023 1935    Radiological Exams on Admission: DG Chest 1 View Result Date: 09/24/2023 CLINICAL DATA:  10026 Shortness of breath 10026 EXAM: CHEST  1 VIEW COMPARISON:  Chest x-ray 07/17/2018. FINDINGS: Left chest wall dual lead pacemaker. Atrial appendage clip. Left mitral and tricuspid valve replacement. The heart and mediastinal contours are unchanged. Atherosclerotic plaque. Biapical pleural/pulmonary scarring. Chronic pericentimeter calcified right upper lobe subpleural nodule. Biapical pleural/pulmonary scarring. No focal consolidation. Chronic coarsened interstitial markings with no overt pulmonary edema. No pleural effusion. No pneumothorax. No acute osseous abnormality.  Sternotomy wires are intact. IMPRESSION: 1. No active disease. 2. Aortic Atherosclerosis (ICD10-I70.0) and Emphysema (ICD10-J43.9). Electronically Signed   By: Tish Frederickson M.D.   On: 09/24/2023 20:12     Data  Reviewed: Relevant notes from primary care and specialist visits, past discharge summaries as available in EHR, including Care Everywhere. Prior diagnostic testing as pertinent to current admission diagnoses Updated medications and problem lists for reconciliation ED course, including vitals, labs, imaging, treatment and response to treatment Triage notes, nursing and pharmacy notes and ED provider's notes Notable results as noted in HPI   Assessment and Plan: Acute on chronic respiratory failure with hypoxia (HCC) Influenza A COPD exacerbation Pulmonary Hypertension on prior echo Tamiflu Was treated with Levaquin in the ED likely prior to fluid results.  Holding antibiotics for now.  Adding on procalcitonin Scheduled and as needed bronchodilators with low-dose IV steroids Flutter valve and incentive spirometer Continue supplemental oxygen and wean to baseline requirement as needed Antitussives, symptom control  CAD s/p CABGx1 Elevated troponin Troponin  elevated without complaints of chest pain, likely demand ischemia Continue simvastatin and bisoprolol, warfarin Getting baseline EKG   Atrial fibrillation-persistent, with history of maze procedure Chronic warfarin anticoagulation INR 2.8 Continue warfarin, pharmacy to manage Continue amiodarone and bisoprolol Monitor for bleeding given coexisting thrombocytopenia  Chronic systolic heart failure (HCC) Cardiomyopathy- EF 30 to 35% 06/2023 Continue current new GDMT with spironolactone, Entresto, Lasix and bisoprolol  Physical deconditioning/frailty Secondary to multiple chronic cardiopulmonary conditions  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 Pacemaker status secondary to postoperative complete heart block No acute issues suspected Last seen by cardiology 09/16/2023, Dr. Graciela Husbands  Thrombocytopenia [facility]) Mild worsening of chronic thrombocytopenia Mild thrombocytopenia of 109,000 (baseline  136-141,000).   Chronic anemia Hemoglobin at baseline        DVT prophylaxis: Coumadin  Consults: none  Advance Care Planning:   Code Status: Limited: Do not attempt resuscitation (DNR) -DNR-LIMITED -Do Not Intubate/DNI    Family Communication: husband at bedside  Disposition Plan: Back to previous home environment  Severity of Illness: The appropriate patient status for this patient is INPATIENT. Inpatient status is judged to be reasonable and necessary in order to provide the required intensity of service to ensure the patient's safety. The patient's presenting symptoms, physical exam findings, and initial radiographic and laboratory data in the context of their chronic comorbidities is felt to place them at high risk for further clinical deterioration. Furthermore, it is not anticipated that the patient will be medically stable for discharge from the hospital within 2 midnights of admission.   * I certify that at the point of admission it is my clinical judgment that the patient will require inpatient hospital care spanning beyond 2 midnights from the point of admission due to high intensity of service, high risk for further deterioration and high frequency of surveillance required.*  Author: Andris Baumann, MD 09/25/2023 1:09 AM  For on call review [email or web address].

## 2023-09-25 NOTE — ED Notes (Signed)
stretcher removed from room 26 by [employer] and then [employer] arrives with pt in wc from [employer] on [place] O2 in a wc pt tripod position accessory muscle use noted short sentences when Conservation officer, nature attempts to gather pt information from chart appears to be influenza positive  - charge RN notified no stretcher in room 26 Presenter, broadcasting places stretcher in room 26 pt in stretcher Resp contacted by charge RN for [employer] resp tech arrives pt placed on [employer] O2 increases from 90% to 98% spouse @ bs

## 2023-09-25 NOTE — Progress Notes (Signed)
PHARMACY - ANTICOAGULATION CONSULT NOTE  Pharmacy Consult for Warfarin  Indication: atrial fibrillation  Allergies  Allergen Reactions   Ace Inhibitors Cough and Other (See Comments)   Amoxicillin-Pot Clavulanate Swelling and Other (See Comments)    SWOLLEN JOINTS  Has patient had a PCN reaction causing immediate rash, facial/tongue/throat swelling, SOB or lightheadedness with hypotension: Yes Has patient had a PCN reaction causing severe rash involving mucus membranes or skin necrosis: No Has patient had a PCN reaction that required hospitalization No Has patient had a PCN reaction occurring within the last 10 years: No If all of the above answers are "NO", then may proceed with Cephalosporin   Penicillins Other (See Comments) and Swelling    SWOLLEN JOINTS   Has patient had a PCN reaction causing immediate rash, facial/tongue/throat swelling, SOB or lightheadedness with hypotension: Yes  Has patient had a PCN reaction causing severe rash involving mucus membranes or skin necrosis: No  Has patient had a PCN reaction that required hospitalization No  Has patient had a PCN reaction occurring within the last 10 years: No  If all of the above answers are "NO", then may proceed with Cephalosporin use.  Swollen joints  Swollen joints, Has patient had a PCN reaction causing immediate rash, facial/tongue/throat swelling, SOB or lightheadedness with hypotension: Yes, Has patient had a PCN reaction causing severe rash involving mucus membranes or skin necrosis: No, Has patient had a PCN reaction that required hospitalization No, Has patient had a PCN reaction occurring within the last 10 years: No, If all of the above answers are "NO", then may proceed with Cephalosporin use.   Rosuvastatin Other (See Comments)   Nifedipine Other (See Comments)    UNSPECIFIED   Propranolol Other (See Comments)    UNSPECIFIED   Diazepam Other (See Comments)    Made her "hyper"    Meperidine Other (See  Comments)    Made her "hyper"     Propoxyphene Other (See Comments)    Made her "hyper"      Patient Measurements: Height: 5\' 1"  (154.9 cm) Weight: 48.3 kg (106 lb 7.7 oz) IBW/kg (Calculated) : 47.8 Heparin Dosing Weight:   Vital Signs: Temp: 98.3 F (36.8 C) (01/15 2331) Temp Source: Oral (01/15 2331) BP: 147/88 (01/15 2305) Pulse Rate: 74 (01/15 2338)  Labs: Recent Labs    09/24/23 1935 09/24/23 2326  HGB 10.7*  --   HCT 32.3*  --   PLT 109*  --   LABPROT  --  29.7*  INR  --  2.8*  CREATININE 0.92  --   TROPONINIHS 23* 25*    Estimated Creatinine Clearance: 33.1 mL/min (by C-G formula based on SCr of 0.92 mg/dL).   Medical History: Past Medical History:  Diagnosis Date   Acute on chronic respiratory failure with hypoxia (HCC) 02/04/2019   Added automatically from request for surgery [phone]   Acute on chronic systolic heart failure (HCC)    Anxiety    Arthritis    "hands" (11/10/2015)   Breast cancer (HCC) 2002   right   CAD s/p CABG x 1    a. 04/2015 [facility] to LAD   Chronic combined systolic (congestive) and diastolic (congestive) heart failure (HCC)    a. 01/2015 Echo: EF 50-55%, Gr2 DD (preop valve surgery); b. 08/2015 Echo: EF 30-35%, diff HK. Gr2 DD; c. 09/2016 Echo: EF 50-55%; d. 07/2018 Echo: EF 20-25%; e. 03/2019 Echo: EF 35-40%, diff HK. Mod red RV fxn, RVSP 34.29mmHg. Mildly dil LA. Nl MV  prosthesis. Triv AI.   COPD (chronic obstructive pulmonary disease) (HCC)    Coronary artery disease    GERD (gastroesophageal reflux disease)    History of colon polyps    History of mitral valve replacement    a. 04/2015 s/p 27 mm [facility] Mitral bovine bioprosthetic tissue valve   Hyperlipidemia    Hypertension    Hypothyroidism    Maze operation for AF w/ LAA clipping    a. 04/2015 Complete bilateral atrial lesion set using cryothermy and bipolar radiofrequency ablation with clipping of LA appendage   Meniere disease    Meniere's disease    On home oxygen  therapy    "2L at night" (11/10/2015)   PAF (paroxysmal atrial fibrillation) (HCC)    Personal history of radiation therapy    Pneumonia ~ 2010   PONV (postoperative nausea and vomiting)    Pt felt like eardrums were gonna pop   Post-surgical complete heart block, symptomatic    a. 04/2015 s/p MDT [id number] Advisa DR MRI DC PPM.   Presence of permanent cardiac pacemaker    Pulmonary hypertension (HCC)    Respiratory failure (HCC) 02/22/2019   Surgical wound, non healing - chest wall 11/10/2015   Tricuspid Regurgitation s/p Repair    a. 04/2015 s/p 26 mm Edwards mc3 ring annuloplasty   Wound infection 10/13/2015   Superficial sternal wound infection    Medications:  (Not in a hospital admission)   Assessment: Pharmacy consulted to dose warfarin for Afib in this 89 year old female admitted with flu, COPD exacerbation.   Pt was on Warfarin 2.5 mg PO MWF and warfarin 1.25 mg PO T-Tue-Sat-Sun.   Last dose on 1/15 PM.   1/15:  INR @ 2326 = 2.8, therapeutic   Goal of Therapy:  INR 2-3   Plan:  1/15:  INR @ 2326 = 2.8, therapeutic  - will continue pt on home warfarin regimen and check INR daily  Marin Milley D 09/25/2023,2:32 AM

## 2023-09-25 NOTE — Progress Notes (Addendum)
PHARMACY - ANTICOAGULATION CONSULT NOTE  Pharmacy Consult for Warfarin  Indication: atrial fibrillation  Allergies  Allergen Reactions   Ace Inhibitors Cough and Other (See Comments)   Amoxicillin-Pot Clavulanate Swelling and Other (See Comments)    SWOLLEN JOINTS  Has patient had a PCN reaction causing immediate rash, facial/tongue/throat swelling, SOB or lightheadedness with hypotension: Yes Has patient had a PCN reaction causing severe rash involving mucus membranes or skin necrosis: No Has patient had a PCN reaction that required hospitalization No Has patient had a PCN reaction occurring within the last 10 years: No If all of the above answers are "NO", then may proceed with Cephalosporin   Penicillins Other (See Comments) and Swelling    SWOLLEN JOINTS   Has patient had a PCN reaction causing immediate rash, facial/tongue/throat swelling, SOB or lightheadedness with hypotension: Yes  Has patient had a PCN reaction causing severe rash involving mucus membranes or skin necrosis: No  Has patient had a PCN reaction that required hospitalization No  Has patient had a PCN reaction occurring within the last 10 years: No  If all of the above answers are "NO", then may proceed with Cephalosporin use.  Swollen joints  Swollen joints, Has patient had a PCN reaction causing immediate rash, facial/tongue/throat swelling, SOB or lightheadedness with hypotension: Yes, Has patient had a PCN reaction causing severe rash involving mucus membranes or skin necrosis: No, Has patient had a PCN reaction that required hospitalization No, Has patient had a PCN reaction occurring within the last 10 years: No, If all of the above answers are "NO", then may proceed with Cephalosporin use.   Rosuvastatin Other (See Comments)   Nifedipine Other (See Comments)    UNSPECIFIED   Propranolol Other (See Comments)    UNSPECIFIED   Diazepam Other (See Comments)    Made her "hyper"    Meperidine Other (See  Comments)    Made her "hyper"     Propoxyphene Other (See Comments)    Made her "hyper"     Patient Measurements: Height: 5\' 1"  (154.9 cm) Weight: 48.3 kg (106 lb 7.7 oz) IBW/kg (Calculated) : 47.8 Heparin Dosing Weight:   Vital Signs: Temp: 98.2 F (36.8 C) (01/16 0728) Temp Source: Oral (01/16 0728) BP: 141/66 (01/16 0728) Pulse Rate: 81 (01/16 0819)  Labs: Recent Labs    09/24/23 1935 09/24/23 2326 09/25/23 0426  HGB 10.7*  --  10.2*  HCT 32.3*  --  30.7*  PLT 109*  --  100*  LABPROT  --  29.7* 33.1*  INR  --  2.8* 3.2*  CREATININE 0.92  --  0.71  TROPONINIHS 23* 25*  --    Estimated Creatinine Clearance: 38.1 mL/min (by C-G formula based on SCr of 0.71 mg/dL).  Medical History: Past Medical History:  Diagnosis Date   Acute on chronic respiratory failure with hypoxia (HCC) 02/04/2019   Added automatically from request for surgery [phone] [id number]   Acute on chronic systolic heart failure (HCC)    Anxiety    Arthritis    "hands" (11/10/2015)   Breast cancer (HCC) 2002   right   CAD s/p CABG x 1    a. 04/2015 [facility] to LAD   Chronic combined systolic (congestive) and diastolic (congestive) heart failure (HCC)    a. 01/2015 Echo: EF 50-55%, Gr2 DD (preop valve surgery); b. 08/2015 Echo: EF 30-35%, diff HK. Gr2 DD; c. 09/2016 Echo: EF 50-55%; d. 07/2018 Echo: EF 20-25%; e. 03/2019 Echo: EF 35-40%, diff HK. Mod red RV  fxn, RVSP 34.49mmHg. Mildly dil LA. Nl MV prosthesis. Triv AI.   COPD (chronic obstructive pulmonary disease) (HCC)    Coronary artery disease    GERD (gastroesophageal reflux disease)    History of colon polyps    History of mitral valve replacement    a. 04/2015 s/p 27 mm [facility] Mitral bovine bioprosthetic tissue valve   Hyperlipidemia    Hypertension    Hypothyroidism    Maze operation for AF w/ LAA clipping    a. 04/2015 Complete bilateral atrial lesion set using cryothermy and bipolar radiofrequency ablation with clipping of LA appendage   Meniere  disease    Meniere's disease    On home oxygen therapy    "2L at night" (11/10/2015)   PAF (paroxysmal atrial fibrillation) (HCC)    Personal history of radiation therapy    Pneumonia ~ 2010   PONV (postoperative nausea and vomiting)    Pt felt like eardrums were gonna pop   Post-surgical complete heart block, symptomatic    a. 04/2015 s/p MDT [id number] Advisa DR MRI DC PPM.   Presence of permanent cardiac pacemaker    Pulmonary hypertension (HCC)    Respiratory failure (HCC) 02/22/2019   Surgical wound, non healing - chest wall 11/10/2015   Tricuspid Regurgitation s/p Repair    a. 04/2015 s/p 26 mm Edwards mc3 ring annuloplasty   Wound infection 10/13/2015   Superficial sternal wound infection   Medications Patient is on warfarin 2.5 mg PO MWF and 1.25 mg PO TThSSu Last dose 1/15 in the PM  Assessment: [name] is a 89 yo female who presented with worsening shortness of breath leading to increased oxygen requirements. They were admitted for COPD exacerbation and tested positive for Influenza A. They have a past medication history significant for atrial fibrillation treated with warfarin. Pharmacy consulted to dose warfarin.  Goal of Therapy:  INR 2-3  INR Date/Time INR Clinical Assessment Dose given  1/15@2326  INR = 2.8 Therapeutic 2.8  1/15@0426  INR = 3.2 SUPRAtherapuetic Hold                  Plan:  Warfarin is SUPRAtherapeutic today Will hold dose for today Recheck INR and CBC with AM labs Continue pt on home warfarin regimen as appropriate based on INR  Effie Shy, PharmD Pharmacy Resident  09/25/2023 11:16 AM

## 2023-09-25 NOTE — Assessment & Plan Note (Addendum)
Chronic warfarin anticoagulation INR 2.8 Continue warfarin, pharmacy to manage Continue amiodarone and bisoprolol Monitor for bleeding given coexisting thrombocytopenia

## 2023-09-25 NOTE — ED Notes (Signed)
Pt's brief changed and new purewick placed.

## 2023-09-25 NOTE — Assessment & Plan Note (Signed)
Secondary to multiple chronic cardiopulmonary conditions

## 2023-09-25 NOTE — Assessment & Plan Note (Signed)
Mild worsening of chronic thrombocytopenia Mild thrombocytopenia of 109,000 (baseline 136-141,000).

## 2023-09-25 NOTE — Progress Notes (Signed)
PHARMACY NOTE:  ANTIMICROBIAL RENAL DOSAGE ADJUSTMENT  Current antimicrobial regimen includes a mismatch between antimicrobial dosage and estimated renal function.  As per policy approved by the Pharmacy & Therapeutics and Medical Executive Committees, the antimicrobial dosage will be adjusted accordingly.  Current antimicrobial dosage:  Tamiflu 75 mg PO BID X 5 days   Indication: flu tx   Renal Function:  Estimated Creatinine Clearance: 33.1 mL/min (by C-G formula based on SCr of 0.92 mg/dL). []      On intermittent HD, scheduled: []      On CRRT    Antimicrobial dosage has been changed to:  Tamiflu 30 mg PO BID X 5 days   Additional comments:   Thank you for allowing pharmacy to be a part of this patient's care.  Carrson Lightcap D, [employer] 09/25/2023 1:18 AM

## 2023-09-25 NOTE — Assessment & Plan Note (Signed)
Pacemaker status secondary to postoperative complete heart block No acute issues suspected Last seen by cardiology 09/16/2023, Dr. Graciela Husbands

## 2023-09-25 NOTE — ED Notes (Signed)
Respiratory at bedside. States patient is able to come off of Bi-PAP for a little while. Patient placed on nasal cannula and currently resting in bed.

## 2023-09-25 NOTE — Assessment & Plan Note (Signed)
See above. 

## 2023-09-25 NOTE — Assessment & Plan Note (Addendum)
Cardiomyopathy- EF 30 to 35% 06/2023 Continue current new GDMT with spironolactone, Entresto, Lasix and bisoprolol

## 2023-09-25 NOTE — ED Notes (Signed)
Patient placed on a purewick and changed out of clothes, placed into a hospital gown.

## 2023-09-25 NOTE — Assessment & Plan Note (Deleted)
INR 2.8 Pharmacy consult to manage Monitor for bleeding in the setting of thrombocytopenia

## 2023-09-25 NOTE — IPAL (Signed)
  Interdisciplinary Goals of Care Family Meeting   Date carried out: 09/25/2023  Location of the meeting: Bedside  Member's involved: Physician and Family Member or next of kin husband  Durable Power of Insurance risk surveyor: patient    Discussion: We discussed goals of care for [name] .   I have reviewed medical records including EPIC notes, labs and imaging, assessed  met with patient and her husband to discuss major active diagnoses, plan of care, natural trajectory, prognosis, GOC, EOL wishes, disposition and options including Full code/DNI/DNR and the concept of comfort care if DNR is elected. Questions and concerns were addressed. They are  in agreement to continue current plan of care . Election for DNR/DNI status.   Code status:   Code Status: Limited: Do not attempt resuscitation (DNR) -DNR-LIMITED -Do Not Intubate/DNI    Disposition: Continue current acute care  Time spent for the meeting: 30    Andris Baumann, MD  09/25/2023, 3:49 AM

## 2023-09-25 NOTE — ED Notes (Signed)
Admitting Provider at bedside. 

## 2023-09-25 NOTE — ED Notes (Signed)
Writer RN left message with pt spouse to bring to ED, when returning, cochlear clear plastic attachment pt reports unintentionally dislodging pt reports piece fell to the Tourist information centre manager RN attempts to locate Clinical biochemist RN unable to locate piece on floor in room 26 pt remains on Bipap is aox4 requests lights remain on sitting upright in stretcher pillows provided for comfort

## 2023-09-25 NOTE — Assessment & Plan Note (Addendum)
Influenza A COPD exacerbation Pulmonary Hypertension on prior echo Tamiflu Was treated with Levaquin in the ED likely prior to fluid results.  Holding antibiotics for now.  Adding on procalcitonin Scheduled and as needed bronchodilators with low-dose IV steroids Flutter valve and incentive spirometer Continue supplemental oxygen and wean to baseline requirement as needed Antitussives, symptom control

## 2023-09-26 DIAGNOSIS — J101 Influenza due to other identified influenza virus with other respiratory manifestations: Secondary | ICD-10-CM | POA: Diagnosis not present

## 2023-09-26 LAB — CBC
HCT: 28.8 % — ABNORMAL LOW (ref 36.0–46.0)
Hemoglobin: 9.6 g/dL — ABNORMAL LOW (ref 12.0–15.0)
MCH: 33.7 pg (ref 26.0–34.0)
MCHC: 33.3 g/dL (ref 30.0–36.0)
MCV: 101.1 fL — ABNORMAL HIGH (ref 80.0–100.0)
Platelets: 126 10*3/uL — ABNORMAL LOW (ref 150–400)
RBC: 2.85 MIL/uL — ABNORMAL LOW (ref 3.87–5.11)
RDW: 13.7 % (ref 11.5–15.5)
WBC: 10.3 10*3/uL (ref 4.0–10.5)
nRBC: 0 % (ref 0.0–0.2)

## 2023-09-26 LAB — BASIC METABOLIC PANEL
Anion gap: 10 (ref 5–15)
BUN: 15 mg/dL (ref 8–23)
CO2: 32 mmol/L (ref 22–32)
Calcium: 8.3 mg/dL — ABNORMAL LOW (ref 8.9–10.3)
Chloride: 95 mmol/L — ABNORMAL LOW (ref 98–111)
Creatinine, Ser: 0.69 mg/dL (ref 0.44–1.00)
GFR, Estimated: >60 mL/min (ref >60)
Glucose, Bld: 141 mg/dL — ABNORMAL HIGH (ref 70–99)
Potassium: 4.4 mmol/L (ref 3.5–5.1)
Sodium: 137 mmol/L (ref 135–145)

## 2023-09-26 LAB — MAGNESIUM: Magnesium: 2.1 mg/dL (ref 1.7–2.4)

## 2023-09-26 LAB — PROTIME-INR
INR: 4.5 (ref 0.8–1.2)
Prothrombin Time: 43 s — ABNORMAL HIGH (ref 11.4–15.2)

## 2023-09-26 MED ORDER — SALINE SPRAY 0.65 % NA SOLN
1.0000 | NASAL | Status: DC | PRN
  Administered 2023-09-26: 1 via NASAL
  Filled 2023-09-26: qty 44

## 2023-09-26 MED ORDER — LEVOTHYROXINE SODIUM 88 MCG PO TABS
88.0000 ug | ORAL_TABLET | Freq: Every day | ORAL | Status: DC
  Administered 2023-09-28 – 2023-10-01 (×4): 88 ug via ORAL
  Filled 2023-09-26 (×5): qty 1

## 2023-09-26 MED ORDER — ENSURE ENLIVE PO LIQD
237.0000 mL | Freq: Two times a day (BID) | ORAL | Status: DC
  Administered 2023-09-26 – 2023-10-01 (×6): 237 mL via ORAL

## 2023-09-26 MED ORDER — POLYETHYLENE GLYCOL 3350 17 G PO PACK
34.0000 g | PACK | Freq: Two times a day (BID) | ORAL | Status: AC
  Administered 2023-09-26 – 2023-09-28 (×3): 34 g via ORAL
  Filled 2023-09-26 (×4): qty 2

## 2023-09-26 NOTE — Progress Notes (Signed)
  PROGRESS NOTE    [name]  SAY:[id number] DOB: June 04, 1935 DOA: 09/24/2023 PCP: Reubin Milan, MD  [id number]-AA  LOS: 1 day   Brief hospital course:   Assessment & Plan: [name] is a 89 y.o. female with medical history significant for Stage 4 COPD on home O2 at 2 L, CAD, chronic A-fib on Coumadin, with history of maze procedure, systolic CHF, mitral and tricuspid valve repair, hypothyroidism, diabetes, anemia of chronic disease, who presents to the ED by EMS with worsening shortness of breath leading her to bump her oxygen up to 3 L.  No help with inhalers.  Also developed cough and congestion.  Husband has similar symptoms.     Influenza A --cont Tamiflu --no need for abx  COPD exacerbation 2/2 flu --cont prednisone --cont Xopenex and atrovent nebs (to reduce tachycardia)  Acute on chronic respiratory failure with hypoxia (HCC) On 2L O2 at baseline --Continue supplemental O2 to keep sats between 88-92%  Pulmonary Hypertension on prior echo  Elevated troponin Troponin elevated without complaints of chest pain, likely demand ischemia  CAD s/p CABGx1 --cont statin  Atrial fibrillation-persistent, with history of maze procedure Chronic warfarin anticoagulation --cont amiodarone, bisoprolol --cont warfrain per pharm  Chronic systolic heart failure (HCC) Cardiomyopathy- EF 30 to 35% 06/2023 Continue current new GDMT with spironolactone, Lasix and bisoprolol --hold Entresto due to soft BP  Physical deconditioning/frailty Secondary to multiple chronic cardiopulmonary conditions --PT  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 Pacemaker status secondary to postoperative complete heart block No acute issues suspected Last seen by cardiology 09/16/2023, Dr. Graciela Husbands  Thrombocytopenia [facility]) Mild worsening of chronic thrombocytopenia Mild thrombocytopenia of 109,000 (baseline 136-141,000).  Chronic anemia Hemoglobin at  baseline  Hypokalemia --monitor and supplement PRN   DVT prophylaxis: AT:FTDDUKGU Code Status: DNR  Family Communication:  Level of care: Med-Surg Dispo:   The patient is from: home Anticipated d/c is to: pending PT eval Anticipated d/c date is: 2-3 days   Subjective and Interval History:  Pt complained of weakness.  Also constipated.     Objective: Vitals:   09/26/23 0722 09/26/23 0730 09/26/23 0846 09/26/23 1720  BP:   109/67 120/81  Pulse:  (!) 160 (!) 127 (!) 126  Resp:  (!) 24 18 16   Temp: 98 F (36.7 C)  97.6 F (36.4 C) (!) 97.3 F (36.3 C)  TempSrc: Oral  Oral Oral  SpO2:  (!) 75% 96% 96%  Weight:      Height:       No intake or output data in the 24 hours ending 09/26/23 1936 Filed Weights   09/24/23 1919  Weight: 48.3 kg    Examination:   Constitutional: NAD, AAOx3 HEENT: conjunctivae and lids normal, EOMI CV: No cyanosis.   RESP: normal respiratory effort Neuro: II - XII grossly intact.   Psych: Normal mood and affect.  Appropriate judgement and reason   Data Reviewed: I have personally reviewed labs and imaging studies  Time spent: 50 minutes  Darlin Priestly, MD [facility] If 7PM-7AM, please contact night-coverage 09/26/2023, 7:36 PM

## 2023-09-26 NOTE — Plan of Care (Signed)
  Problem: Education: Goal: Knowledge of General Education information will improve Description: Including pain rating scale, medication(s)/side effects and non-pharmacologic comfort measures Outcome: Progressing   Problem: Activity: Goal: Risk for activity intolerance will decrease Outcome: Progressing   Problem: Elimination: Goal: Will not experience complications related to bowel motility Outcome: Progressing   Problem: Pain Managment: Goal: General experience of comfort will improve and/or be controlled Outcome: Progressing   Problem: Safety: Goal: Ability to remain free from injury will improve Outcome: Progressing

## 2023-09-26 NOTE — ED Notes (Signed)
Provider notified about PT's INR level. Provider asked this RN to message pharmacy to have their warfarin dose adjusted accordingly. Message was sent to pharmacy.

## 2023-09-26 NOTE — Plan of Care (Signed)

## 2023-09-26 NOTE — ED Notes (Signed)
Pt's brief was changed and pt repositioned in bed.

## 2023-09-26 NOTE — Progress Notes (Signed)
PHARMACY - ANTICOAGULATION CONSULT NOTE  Pharmacy Consult for Warfarin  Indication: atrial fibrillation  Allergies  Allergen Reactions   Ace Inhibitors Cough and Other (See Comments)   Amoxicillin-Pot Clavulanate Swelling and Other (See Comments)    SWOLLEN JOINTS  Has patient had a PCN reaction causing immediate rash, facial/tongue/throat swelling, SOB or lightheadedness with hypotension: Yes Has patient had a PCN reaction causing severe rash involving mucus membranes or skin necrosis: No Has patient had a PCN reaction that required hospitalization No Has patient had a PCN reaction occurring within the last 10 years: No If all of the above answers are "NO", then may proceed with Cephalosporin   Penicillins Other (See Comments) and Swelling    SWOLLEN JOINTS   Has patient had a PCN reaction causing immediate rash, facial/tongue/throat swelling, SOB or lightheadedness with hypotension: Yes  Has patient had a PCN reaction causing severe rash involving mucus membranes or skin necrosis: No  Has patient had a PCN reaction that required hospitalization No  Has patient had a PCN reaction occurring within the last 10 years: No  If all of the above answers are "NO", then may proceed with Cephalosporin use.  Swollen joints  Swollen joints, Has patient had a PCN reaction causing immediate rash, facial/tongue/throat swelling, SOB or lightheadedness with hypotension: Yes, Has patient had a PCN reaction causing severe rash involving mucus membranes or skin necrosis: No, Has patient had a PCN reaction that required hospitalization No, Has patient had a PCN reaction occurring within the last 10 years: No, If all of the above answers are "NO", then may proceed with Cephalosporin use.   Rosuvastatin Other (See Comments)   Nifedipine Other (See Comments)    UNSPECIFIED   Propranolol Other (See Comments)    UNSPECIFIED   Diazepam Other (See Comments)    Made her "hyper"    Meperidine Other (See  Comments)    Made her "hyper"     Propoxyphene Other (See Comments)    Made her "hyper"     Patient Measurements: Height: 5\' 1"  (154.9 cm) Weight: 48.3 kg (106 lb 7.7 oz) IBW/kg (Calculated) : 47.8 Heparin Dosing Weight:   Vital Signs: Temp: 98 F (36.7 C) (01/16 2204) Temp Source: Oral (01/16 2204) BP: 142/80 (01/17 0700) Pulse Rate: 125 (01/17 0700)  Labs: Recent Labs    09/24/23 1935 09/24/23 2326 09/25/23 0426 09/26/23 0448  HGB 10.7*  --  10.2* 9.6*  HCT 32.3*  --  30.7* 28.8*  PLT 109*  --  100* 126*  LABPROT  --  29.7* 33.1* 43.0*  INR  --  2.8* 3.2* 4.5*  CREATININE 0.92  --  0.71 0.69  TROPONINIHS 23* 25*  --   --    Estimated Creatinine Clearance: 38.1 mL/min (by C-G formula based on SCr of 0.69 mg/dL).  Medical History: Past Medical History:  Diagnosis Date   Acute on chronic respiratory failure with hypoxia (HCC) 02/04/2019   Added automatically from request for surgery [phone]   Acute on chronic systolic heart failure (HCC)    Anxiety    Arthritis    "hands" (11/10/2015)   Breast cancer (HCC) 2002   right   CAD s/p CABG x 1    a. 04/2015 LIMA to LAD   Chronic combined systolic (congestive) and diastolic (congestive) heart failure (HCC)    a. 01/2015 Echo: EF 50-55%, Gr2 DD (preop valve surgery); b. 08/2015 Echo: EF 30-35%, diff HK. Gr2 DD; c. 09/2016 Echo: EF 50-55%; d. 07/2018 Echo: EF  20-25%; e. 03/2019 Echo: EF 35-40%, diff HK. Mod red RV fxn, RVSP 34.52mmHg. Mildly dil LA. Nl MV prosthesis. Triv AI.   COPD (chronic obstructive pulmonary disease) (HCC)    Coronary artery disease    GERD (gastroesophageal reflux disease)    History of colon polyps    History of mitral valve replacement    a. 04/2015 s/p 27 mm [facility] Mitral bovine bioprosthetic tissue valve   Hyperlipidemia    Hypertension    Hypothyroidism    Maze operation for AF w/ LAA clipping    a. 04/2015 Complete bilateral atrial lesion set using cryothermy and bipolar radiofrequency  ablation with clipping of LA appendage   Meniere disease    Meniere's disease    On home oxygen therapy    "2L at night" (11/10/2015)   PAF (paroxysmal atrial fibrillation) (HCC)    Personal history of radiation therapy    Pneumonia ~ 2010   PONV (postoperative nausea and vomiting)    Pt felt like eardrums were gonna pop   Post-surgical complete heart block, symptomatic    a. 04/2015 s/p MDT [id number] Advisa DR MRI DC PPM.   Presence of permanent cardiac pacemaker    Pulmonary hypertension (HCC)    Respiratory failure (HCC) 02/22/2019   Surgical wound, non healing - chest wall 11/10/2015   Tricuspid Regurgitation s/p Repair    a. 04/2015 s/p 26 mm Edwards mc3 ring annuloplasty   Wound infection 10/13/2015   Superficial sternal wound infection   Medications Patient is on warfarin 2.5 mg PO MWF and 1.25 mg PO TThSSu Last dose 1/15 in the PM  Assessment: NM is a 89 yo female who presented with worsening shortness of breath leading to increased oxygen requirements. They were admitted for COPD exacerbation and tested positive for Influenza A. They have a past medication history significant for atrial fibrillation treated with warfarin. Pharmacy consulted to dose warfarin.  DDIs: amiodarone (on PTA)  Goal of Therapy:  INR 2-3  INR Date/Time INR Clinical Assessment Dose given  1/15 INR 2.8 Therapeutic 2.8  1/16 INR 3.2 SUPRAtherapuetic HOLD  1/17  INR 4.5 SUPRAtherapuetic HOLD             Plan:  Warfarin is SUPRAtherapeutic today and trend is still up Will hold dose for today Recheck INR and CBC with AM labs Continue pt on home warfarin regimen as appropriate based on INR  Lowella Bandy, PharmD 09/26/2023 7:12 AM

## 2023-09-27 DIAGNOSIS — J101 Influenza due to other identified influenza virus with other respiratory manifestations: Secondary | ICD-10-CM | POA: Diagnosis not present

## 2023-09-27 LAB — BASIC METABOLIC PANEL
Anion gap: 9 (ref 5–15)
BUN: 27 mg/dL — ABNORMAL HIGH (ref 8–23)
CO2: 32 mmol/L (ref 22–32)
Calcium: 8.8 mg/dL — ABNORMAL LOW (ref 8.9–10.3)
Chloride: 97 mmol/L — ABNORMAL LOW (ref 98–111)
Creatinine, Ser: 0.75 mg/dL (ref 0.44–1.00)
GFR, Estimated: >60 mL/min (ref >60)
Glucose, Bld: 116 mg/dL — ABNORMAL HIGH (ref 70–99)
Potassium: 4.9 mmol/L (ref 3.5–5.1)
Sodium: 138 mmol/L (ref 135–145)

## 2023-09-27 LAB — MAGNESIUM: Magnesium: 2.3 mg/dL (ref 1.7–2.4)

## 2023-09-27 LAB — CBC
HCT: 32 % — ABNORMAL LOW (ref 36.0–46.0)
Hemoglobin: 10.4 g/dL — ABNORMAL LOW (ref 12.0–15.0)
MCH: 33.3 pg (ref 26.0–34.0)
MCHC: 32.5 g/dL (ref 30.0–36.0)
MCV: 102.6 fL — ABNORMAL HIGH (ref 80.0–100.0)
Platelets: 169 10*3/uL (ref 150–400)
RBC: 3.12 MIL/uL — ABNORMAL LOW (ref 3.87–5.11)
RDW: 13.8 % (ref 11.5–15.5)
WBC: 10.4 10*3/uL (ref 4.0–10.5)
nRBC: 0 % (ref 0.0–0.2)

## 2023-09-27 LAB — PROTIME-INR
INR: 2.6 — ABNORMAL HIGH (ref 0.8–1.2)
Prothrombin Time: 28.3 s — ABNORMAL HIGH (ref 11.4–15.2)

## 2023-09-27 MED ORDER — FUROSEMIDE 10 MG/ML IJ SOLN
40.0000 mg | Freq: Once | INTRAMUSCULAR | Status: AC
  Administered 2023-09-27: 40 mg via INTRAVENOUS
  Filled 2023-09-27: qty 4

## 2023-09-27 MED ORDER — LEVALBUTEROL HCL 0.63 MG/3ML IN NEBU
0.6300 mg | INHALATION_SOLUTION | Freq: Four times a day (QID) | RESPIRATORY_TRACT | Status: DC | PRN
  Filled 2023-09-27: qty 3

## 2023-09-27 MED ORDER — BISOPROLOL FUMARATE 5 MG PO TABS
5.0000 mg | ORAL_TABLET | Freq: Every day | ORAL | Status: DC
  Administered 2023-09-27: 5 mg via ORAL
  Filled 2023-09-27 (×2): qty 1

## 2023-09-27 MED ORDER — WARFARIN SODIUM 2.5 MG PO TABS
2.5000 mg | ORAL_TABLET | Freq: Once | ORAL | Status: AC
Start: 2023-09-27 — End: 2023-09-27
  Administered 2023-09-27: 2.5 mg via ORAL
  Filled 2023-09-27: qty 1

## 2023-09-27 NOTE — Progress Notes (Signed)
PHARMACY - ANTICOAGULATION CONSULT NOTE  Pharmacy Consult for Warfarin  Indication: atrial fibrillation  Allergies  Allergen Reactions   Ace Inhibitors Cough and Other (See Comments)   Amoxicillin-Pot Clavulanate Swelling and Other (See Comments)    SWOLLEN JOINTS  Has patient had a PCN reaction causing immediate rash, facial/tongue/throat swelling, SOB or lightheadedness with hypotension: Yes Has patient had a PCN reaction causing severe rash involving mucus membranes or skin necrosis: No Has patient had a PCN reaction that required hospitalization No Has patient had a PCN reaction occurring within the last 10 years: No If all of the above answers are "NO", then may proceed with Cephalosporin   Penicillins Other (See Comments) and Swelling    SWOLLEN JOINTS   Has patient had a PCN reaction causing immediate rash, facial/tongue/throat swelling, SOB or lightheadedness with hypotension: Yes  Has patient had a PCN reaction causing severe rash involving mucus membranes or skin necrosis: No  Has patient had a PCN reaction that required hospitalization No  Has patient had a PCN reaction occurring within the last 10 years: No  If all of the above answers are "NO", then may proceed with Cephalosporin use.  Swollen joints  Swollen joints, Has patient had a PCN reaction causing immediate rash, facial/tongue/throat swelling, SOB or lightheadedness with hypotension: Yes, Has patient had a PCN reaction causing severe rash involving mucus membranes or skin necrosis: No, Has patient had a PCN reaction that required hospitalization No, Has patient had a PCN reaction occurring within the last 10 years: No, If all of the above answers are "NO", then may proceed with Cephalosporin use.   Rosuvastatin Other (See Comments)   Nifedipine Other (See Comments)    UNSPECIFIED   Propranolol Other (See Comments)    UNSPECIFIED   Diazepam Other (See Comments)    Made her "hyper"    Meperidine Other (See  Comments)    Made her "hyper"     Propoxyphene Other (See Comments)    Made her "hyper"     Patient Measurements: Height: 5\' 1"  (154.9 cm) Weight: 48.3 kg (106 lb 7.7 oz) IBW/kg (Calculated) : 47.8  Vital Signs: Temp: 97.7 F (36.5 C) (01/18 0503) Temp Source: Oral (01/18 0503) BP: 114/76 (01/18 0503) Pulse Rate: 128 (01/18 0503)  Labs: Recent Labs    09/24/23 1935 09/24/23 2326 09/24/23 2326 09/25/23 0426 09/26/23 0448 09/27/23 0547  HGB 10.7*  --   --  10.2* 9.6* 10.4*  HCT 32.3*  --   --  30.7* 28.8* 32.0*  PLT 109*  --   --  100* 126* 169  LABPROT  --  29.7*   < > 33.1* 43.0* 28.3*  INR  --  2.8*   < > 3.2* 4.5* 2.6*  CREATININE 0.92  --   --  0.71 0.69 0.75  TROPONINIHS 23* 25*  --   --   --   --    < > = values in this interval not displayed.   Estimated Creatinine Clearance: 38.1 mL/min (by C-G formula based on SCr of 0.75 mg/dL).  Medical History: Past Medical History:  Diagnosis Date   Acute on chronic respiratory failure with hypoxia (HCC) 02/04/2019   Added automatically from request for surgery [phone]   Acute on chronic systolic heart failure (HCC)    Anxiety    Arthritis    "hands" (11/10/2015)   Breast cancer (HCC) 2002   right   CAD s/p CABG x 1    a. 04/2015 LIMA to LAD  Chronic combined systolic (congestive) and diastolic (congestive) heart failure (HCC)    a. 01/2015 Echo: EF 50-55%, Gr2 DD (preop valve surgery); b. 08/2015 Echo: EF 30-35%, diff HK. Gr2 DD; c. 09/2016 Echo: EF 50-55%; d. 07/2018 Echo: EF 20-25%; e. 03/2019 Echo: EF 35-40%, diff HK. Mod red RV fxn, RVSP 34.3mmHg. Mildly dil LA. Nl MV prosthesis. Triv AI.   COPD (chronic obstructive pulmonary disease) (HCC)    Coronary artery disease    GERD (gastroesophageal reflux disease)    History of colon polyps    History of mitral valve replacement    a. 04/2015 s/p 27 mm [facility] Mitral bovine bioprosthetic tissue valve   Hyperlipidemia    Hypertension    Hypothyroidism    Maze  operation for AF w/ LAA clipping    a. 04/2015 Complete bilateral atrial lesion set using cryothermy and bipolar radiofrequency ablation with clipping of LA appendage   Meniere disease    Meniere's disease    On home oxygen therapy    "2L at night" (11/10/2015)   PAF (paroxysmal atrial fibrillation) (HCC)    Personal history of radiation therapy    Pneumonia ~ 2010   PONV (postoperative nausea and vomiting)    Pt felt like eardrums were gonna pop   Post-surgical complete heart block, symptomatic    a. 04/2015 s/p MDT [id number] Advisa DR MRI DC PPM.   Presence of permanent cardiac pacemaker    Pulmonary hypertension (HCC)    Respiratory failure (HCC) 02/22/2019   Surgical wound, non healing - chest wall 11/10/2015   Tricuspid Regurgitation s/p Repair    a. 04/2015 s/p 26 mm Edwards mc3 ring annuloplasty   Wound infection 10/13/2015   Superficial sternal wound infection   Medications Patient is on warfarin 2.5 mg PO MWF and 1.25 mg PO TThSSu Last dose 1/15 in the PM  Assessment: NM is a 89 yo female who presented with worsening shortness of breath leading to increased oxygen requirements. They were admitted for COPD exacerbation and tested positive for Influenza A. They have a past medication history significant for atrial fibrillation treated with warfarin. Pharmacy consulted to dose warfarin.  DDIs: amiodarone (on PTA)  Goal of Therapy:  INR 2-3  INR Date/Time INR Clinical Assessment Dose given  1/15 INR 2.8 Therapeutic HOLD  1/16 INR 3.2 SUPRAtherapuetic HOLD  1/17  INR 4.5 SUPRAtherapuetic HOLD  1/18 INR 2.6 Therapeutic 2.5 mg        Plan:  Warfarin is therapeutic today and trend is now down Will order warfarin 2.5 mg po x 1 Recheck INR and CBC with AM labs Continue pt on home warfarin regimen as appropriate based on INR  Lowella Bandy, PharmD 09/27/2023 7:45 AM

## 2023-09-27 NOTE — Evaluation (Signed)
Physical Therapy Evaluation Patient Details Name: [name] MRN: [id number] DOB: Jan 16, 1935 Today's Date: 09/27/2023  History of Present Illness  Pt is an 89 y.o. female presenting to hospital 09/24/23 with c/o worsening SOB.  Pt admitted with influenza A, COPD exacerbation, acute on chronic respiratory failure with hypoxia, elevated troponin (likely demand ischemia per MD note), a-fib (h/o maze procedure).  PMH includes COPD on 2 L baseline, a-fib, CAD, HF, htn, pulmonary htn, R breast CA, Meniere's disease, cochlear implant, CABG.  Clinical Impression  Prior to recent medical concerns, pt was modified independent ambulating with SPC; uses 4ww in community; lives with her husband in 1 level home with 5 STE L railing.  No c/o pain during session.  Nursing requesting no exertional activity d/t pt's elevated HR at rest (around 130 bpm) but cleared pt to sit on EOB.  Currently pt is SBA with bed mobility and sitting balance on EOB.  Pt reporting sitting on EOB felt good.  Pt's HR 128-132 bpm and SpO2 sats 96% or greater on 5 L supplemental O2 via nasal cannula during sessions activities.  Pt demonstrating generalized weakness and pt reporting impaired strength/activity tolerance from baseline.  Pt's husband reports pt needs to be ambulatory and be able to take care of herself in order to safely discharge home.  Pt would currently benefit from skilled PT to address noted impairments and functional limitations (see below for any additional details).  Upon hospital discharge, pt would benefit from ongoing therapy.     If plan is discharge home, recommend the following: A little help with walking and/or transfers;A little help with bathing/dressing/bathroom;Assistance with cooking/housework;Assist for transportation;Help with stairs or ramp for entrance   Can travel by private vehicle        Equipment Recommendations Other (comment) (TBD pending further functional assessment)  Recommendations for Other  Services  OT consult    Functional Status Assessment Patient has had a recent decline in their functional status and demonstrates the ability to make significant improvements in function in a reasonable and predictable amount of time.     Precautions / Restrictions Precautions Precautions: Fall Restrictions Weight Bearing Restrictions Per Provider Order: No      Mobility  Bed Mobility Overal bed mobility: Needs Assistance Bed Mobility: Supine to Sit, Sit to Supine     Supine to sit: Supervision, HOB elevated Sit to supine: Supervision, HOB elevated   General bed mobility comments: mild increased effort to perform on own    Transfers                   General transfer comment: Deferred per nursing request (d/t elevated HR at rest)    Ambulation/Gait                  Stairs            Wheelchair Mobility     Tilt Bed    Modified Rankin (Stroke Patients Only)       Balance Overall balance assessment: Needs assistance Sitting-balance support: No upper extremity supported, Feet unsupported Sitting balance-Leahy Scale: Good Sitting balance - Comments: steady reaching within BOS                                     Pertinent Vitals/Pain Pain Assessment Pain Assessment: No/denies pain    Home Living Family/patient expects to be discharged to:: Private residence Living Arrangements: Spouse/significant other  Available Help at Discharge: Family;Available PRN/intermittently Type of Home: House Home Access: Stairs to enter Entrance Stairs-Rails: Left Entrance Stairs-Number of Steps: 5 (from garage)   Home Layout: One level Home Equipment: Shower seat;Grab bars - tub/shower;Grab bars - toilet;Rollator (4 wheels);Cane - single point Additional Comments: Pt uses 2 L supplemental O2 chronic    Prior Function Prior Level of Function : Independent/Modified Independent             Mobility Comments: Modified independent  ambulating with SPC; uses 4ww in community.       Extremity/Trunk Assessment   Upper Extremity Assessment Upper Extremity Assessment: Overall WFL for tasks assessed    Lower Extremity Assessment Lower Extremity Assessment: Generalized weakness    Cervical / Trunk Assessment Cervical / Trunk Assessment: Normal  Communication   Communication Communication: Hearing impairment (Cochlear implant) Cueing Techniques: Verbal cues  Cognition Arousal: Alert Behavior During Therapy: WFL for tasks assessed/performed Overall Cognitive Status: Within Functional Limits for tasks assessed                                 General Comments: Pt demonstrating some confusion regarding home set-up (pt's husband clarified intermittently)        General Comments  Pt agreeable to PT session.  Pt's husband present during session.    Exercises     Assessment/Plan    PT Assessment Patient needs continued PT services  PT Problem List Decreased strength;Decreased activity tolerance;Decreased balance;Decreased mobility;Cardiopulmonary status limiting activity       PT Treatment Interventions DME instruction;Gait training;Stair training;Functional mobility training;Therapeutic activities;Therapeutic exercise;Balance training;Patient/family education    PT Goals (Current goals can be found in the Care Plan section)  Acute Rehab PT Goals Patient Stated Goal: to improve strength and walking PT Goal Formulation: With patient/family Time For Goal Achievement: 10/11/23 Potential to Achieve Goals: Fair    Frequency Min 1X/week     Co-evaluation               AM-PAC PT "6 Clicks" Mobility  Outcome Measure Help needed turning from your back to your side while in a flat bed without using bedrails?: None Help needed moving from lying on your back to sitting on the side of a flat bed without using bedrails?: A Little Help needed moving to and from a bed to a chair (including a  wheelchair)?: A Little Help needed standing up from a chair using your arms (e.g., wheelchair or bedside chair)?: A Little Help needed to walk in hospital room?: A Lot Help needed climbing 3-5 steps with a railing? : A Lot 6 Click Score: 17    End of Session Equipment Utilized During Treatment: Oxygen (5 L via nasal cannula) Activity Tolerance: Patient tolerated treatment well Patient left: in bed;with call bell/phone within reach;with bed alarm set;with family/visitor present Nurse Communication: Mobility status;Precautions;Other (comment) (pt's vitals during session) PT Visit Diagnosis: Other abnormalities of gait and mobility (R26.89);Muscle weakness (generalized) (M62.81)    Time: 0940-1001 PT Time Calculation (min) (ACUTE ONLY): 21 min   Charges:   PT Evaluation $PT Eval Low Complexity: 1 Low   PT General Charges $$ ACUTE PT VISIT: 1 Visit        Hendricks Limes, PT 09/27/23, 1:53 PM

## 2023-09-27 NOTE — Progress Notes (Signed)
  PROGRESS NOTE    [name]  ZOX:[id number] DOB: May 22, 1935 DOA: 09/24/2023 PCP: Reubin Milan, MD  [id number]/[id number]  LOS: 2 days   Brief hospital course:   Assessment & Plan: [name] is a 89 y.o. female with medical history significant for Stage 4 COPD on home O2 at 2 L, CAD, chronic A-fib on Coumadin, with history of maze procedure, systolic CHF, mitral and tricuspid valve repair, hypothyroidism, diabetes, anemia of chronic disease, who presents to the ED by EMS with worsening shortness of breath leading her to bump her oxygen up to 3 L.  No help with inhalers.  Also developed cough and congestion.  Husband has similar symptoms.     Influenza A --cont Tamiflu --no need for abx  COPD exacerbation 2/2 flu --s/p Xopenex and atrovent nebs (to reduce tachycardia), now d/c'ed by RT --cont prednisone 40 mg daily  Acute on chronic respiratory failure with hypoxia (HCC) On 2L O2 at baseline --Continue supplemental O2 to keep sats between 88-92%, wean to home 2L  Pulmonary Hypertension on prior echo --cont lasix and spironolactone   Elevated troponin Troponin elevated without complaints of chest pain, likely demand ischemia  CAD s/p CABGx1 --cont statin  Atrial fibrillation-persistent, with history of maze procedure Pacemaker present Chronic warfarin anticoagulation --having persistent sinus tachycardia --cont amiodarone --increase bisoprolol to 5 mg daily --cont warfrain per pharm  Chronic systolic heart failure (HCC) Cardiomyopathy- EF 30 to 35% 06/2023 Continue current new GDMT with spironolactone, Lasix and bisoprolol --hold Entresto due to soft BP  Physical deconditioning/frailty Secondary to multiple chronic cardiopulmonary conditions --PT  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 Pacemaker status secondary to postoperative complete heart block No acute issues suspected Last seen by cardiology 09/16/2023, Dr.  Graciela Husbands  Thrombocytopenia [facility]) Mild worsening of chronic thrombocytopenia Mild thrombocytopenia of 109,000 (baseline 136-141,000).  Chronic anemia Hemoglobin at baseline  Hypokalemia --monitor and supplement PRN   DVT prophylaxis: WJ:[id number] Code Status: DNR  Family Communication:  Level of care: Med-Surg Dispo:   The patient is from: home Anticipated d/c is to: SNF rehab Anticipated d/c date is: 1-2 days   Subjective and Interval History:  Pt denied chest pain.  Feeling less weak.  Persistent sinus tachycardia.     Objective: Vitals:   09/27/23 0340 09/27/23 0503 09/27/23 0734 09/27/23 1450  BP: 131/84 114/76  125/73  Pulse: (!) 128 (!) 128  (!) 131  Resp: 18 20  18   Temp: 98.2 F (36.8 C) 97.7 F (36.5 C)    TempSrc: Oral Oral    SpO2: 98% 100% 100% 96%  Weight:      Height:       No intake or output data in the 24 hours ending 09/27/23 1831 Filed Weights   09/24/23 1919  Weight: 48.3 kg    Examination:   Constitutional: NAD, AAOx3 HEENT: conjunctivae and lids normal, EOMI CV: No cyanosis.   RESP: normal respiratory effort, on 5L Neuro: II - XII grossly intact.   Psych: Normal mood and affect.  Appropriate judgement and reason   Data Reviewed: I have personally reviewed labs and imaging studies  Time spent: 35 minutes  Darlin Priestly, MD [facility] If 7PM-7AM, please contact night-coverage 09/27/2023, 6:31 PM

## 2023-09-28 DIAGNOSIS — J101 Influenza due to other identified influenza virus with other respiratory manifestations: Secondary | ICD-10-CM | POA: Diagnosis not present

## 2023-09-28 LAB — PROTIME-INR
INR: 1.9 — ABNORMAL HIGH (ref 0.8–1.2)
Prothrombin Time: 22.4 s — ABNORMAL HIGH (ref 11.4–15.2)

## 2023-09-28 MED ORDER — FUROSEMIDE 10 MG/ML IJ SOLN
40.0000 mg | Freq: Once | INTRAMUSCULAR | Status: AC
Start: 2023-09-28 — End: 2023-09-28
  Administered 2023-09-28: 40 mg via INTRAVENOUS
  Filled 2023-09-28: qty 4

## 2023-09-28 MED ORDER — FUROSEMIDE 10 MG/ML IJ SOLN
40.0000 mg | Freq: Once | INTRAMUSCULAR | Status: AC
  Administered 2023-09-28: 40 mg via INTRAVENOUS
  Filled 2023-09-28: qty 4

## 2023-09-28 MED ORDER — BISOPROLOL FUMARATE 5 MG PO TABS
10.0000 mg | ORAL_TABLET | Freq: Every day | ORAL | Status: DC
  Administered 2023-09-28 – 2023-09-29 (×2): 10 mg via ORAL
  Filled 2023-09-28 (×2): qty 2

## 2023-09-28 MED ORDER — WARFARIN SODIUM 1 MG PO TABS
1.0000 mg | ORAL_TABLET | Freq: Once | ORAL | Status: AC
  Administered 2023-09-28: 1 mg via ORAL
  Filled 2023-09-28: qty 1

## 2023-09-28 NOTE — Evaluation (Signed)
Occupational Therapy Evaluation Patient Details Name: [name] MRN: [id number] DOB: 1935-04-28 Today's Date: 09/28/2023   History of Present Illness Pt is an 89 y.o. female presenting to hospital 09/24/23 with c/o worsening SOB.  Pt admitted with influenza A, COPD exacerbation, acute on chronic respiratory failure with hypoxia, elevated troponin (likely demand ischemia per MD note), a-fib (h/o maze procedure).  PMH includes COPD on 2 L baseline, a-fib, CAD, HF, htn, pulmonary htn, R breast CA, Meniere's disease, cochlear implant, CABG.   Clinical Impression   Pt was seen for OT evaluation this date. Prior to hospital admission, pt was living with her husband and managing for herself using [facility] or rollator for mobility when going out, but per husband not using either for indoor mobility.   Pt presents to acute OT demonstrating impaired ADL performance and functional mobility 2/2 weakness and low activity tolerance (See OT problem list for additional functional deficits). Pt denies pain. Nurse okay with transfer to chair today. Pt currently requires SUP for bed mobility, STS and SPT to recliner. Max A at bed level to don socks for LB dressing as she did not feel she could bend to reach her feet. HR between 127-139 during session with highest reading following transfer. Pt very appreciative and happy to be OOB. Pt would benefit from skilled OT services to address noted impairments and functional limitations (see below for any additional details) in order to maximize safety and independence while minimizing falls risk and caregiver burden. Do anticipate the need for follow up OT services upon acute hospital DC.        If plan is discharge home, recommend the following: A little help with walking and/or transfers;A little help with bathing/dressing/bathroom;Assistance with cooking/housework;Assist for transportation;Help with stairs or ramp for entrance    Functional Status Assessment  Patient has  had a recent decline in their functional status and demonstrates the ability to make significant improvements in function in a reasonable and predictable amount of time.  Equipment Recommendations  BSC/3in1    Recommendations for Other Services       Precautions / Restrictions Precautions Precautions: Fall Restrictions Weight Bearing Restrictions Per Provider Order: No      Mobility Bed Mobility Overal bed mobility: Needs Assistance Bed Mobility: Supine to Sit, Sit to Supine     Supine to sit: Supervision, HOB elevated     General bed mobility comments: SUP with increased time and ability to scoot to EOB    Transfers Overall transfer level: Needs assistance   Transfers: Sit to/from Stand, Bed to chair/wheelchair/BSC Sit to Stand: Supervision           General transfer comment: SUP for STS and SPT to recliner      Balance Overall balance assessment: Needs assistance Sitting-balance support: No upper extremity supported, Feet unsupported Sitting balance-Leahy Scale: Good     Standing balance support: No upper extremity supported Standing balance-Leahy Scale: Good Standing balance comment: SUP for STS and SPT to recliner                           ADL either performed or assessed with clinical judgement   ADL Overall ADL's : Needs assistance/impaired                     Lower Body Dressing: Maximal assistance;Bed level Lower Body Dressing Details (indicate cue type and reason): pt seemingly able to do, but reports she is not sure  and wishes for OT to do it Toilet Transfer: Radiographer, therapeutic Details (indicate cue type and reason): simulated to recliner                 Vision         Perception         Praxis         Pertinent Vitals/Pain Pain Assessment Pain Assessment: No/denies pain     Extremity/Trunk Assessment         Cervical / Trunk Assessment Cervical / Trunk Assessment: Normal    Communication Communication Communication: Hearing impairment (Cochlear implant) Cueing Techniques: Verbal cues   Cognition Arousal: Alert Behavior During Therapy: WFL for tasks assessed/performed Overall Cognitive Status: Within Functional Limits for tasks assessed                                 General Comments: Pt demonstrating some confusion regarding home set-up (pt's husband clarified intermittently)     General Comments  HR 127-139 during session; highest reading was following SPT    Exercises Other Exercises Other Exercises: Edu on role of OT and importance of therapy to maximize safety/IND/strength to return to PLOF.   Shoulder Instructions      Home Living Family/patient expects to be discharged to:: Private residence Living Arrangements: Spouse/significant other Available Help at Discharge: Family;Available PRN/intermittently Type of Home: House Home Access: Stairs to enter [employer] of Steps: 5 (from garage) Entrance Stairs-Rails: Left Home Layout: One level     Bathroom Shower/Tub: Producer, television/film/video: Handicapped height     Home Equipment: Shower seat;Grab bars - tub/shower;Grab bars - toilet;Rollator (4 wheels);Cane - single point   Additional Comments: Pt uses 2 L supplemental O2 chronic      Prior Functioning/Environment Prior Level of Function : Independent/Modified Independent             Mobility Comments: Modified independent ambulating with SPC; uses 4ww in community.          OT Problem List: Decreased activity tolerance      OT Treatment/Interventions: Self-care/ADL training;Patient/family education;Balance training;Energy conservation;Therapeutic activities;Therapeutic exercise;DME and/or AE instruction    OT Goals(Current goals can be found in the care plan section) Acute Rehab OT Goals Patient Stated Goal: improve HR OT Goal Formulation: With patient/family Time For Goal Achievement:  10/12/23 Potential to Achieve Goals: Good ADL Goals Pt Will Perform Upper Body Bathing: sitting;with modified independence Pt Will Perform Lower Body Bathing: with supervision;sit to/from stand;sitting/lateral leans Pt Will Perform Upper Body Dressing: with modified independence;sitting Pt Will Perform Lower Body Dressing: with supervision;sitting/lateral leans;sit to/from stand Pt Will Transfer to Toilet: with modified independence;regular height toilet;ambulating Pt Will Perform Toileting - Clothing Manipulation and hygiene: with modified independence;sit to/from stand;sitting/lateral leans  OT Frequency: Min 1X/week    Co-evaluation              AM-PAC OT "6 Clicks" Daily Activity     Outcome Measure Help from another person eating meals?: None Help from another person taking care of personal grooming?: None Help from another person toileting, which includes using toliet, bedpan, or urinal?: A Little Help from another person bathing (including washing, rinsing, drying)?: A Lot Help from another person to put on and taking off regular upper body clothing?: None Help from another person to put on and taking off regular lower body clothing?: A Lot 6 Click Score: 19   End  of Session Nurse Communication: Mobility status  Activity Tolerance: Patient tolerated treatment well Patient left: with call bell/phone within reach;with chair alarm set;with family/visitor present  OT Visit Diagnosis: Other abnormalities of gait and mobility (R26.89)                Time: 6578-4696 OT Time Calculation (min): 23 min Charges:  OT General Charges $OT Visit: 1 Visit OT Evaluation $OT Eval Moderate Complexity: 1 Mod OT Treatments $Therapeutic Activity: 8-22 mins Gasper Hopes, OTR/L 09/28/23, 10:40 AM  Cabrina Shiroma E Quavis Klutz 09/28/2023, 10:39 AM

## 2023-09-28 NOTE — NC FL2 (Signed)
[place] MEDICAID FL2 LEVEL OF CARE FORM     IDENTIFICATION  Patient Name: [name] Birthdate: 12-19-1934 Sex: female Admission Date (Current Location): 09/24/2023  [facility] and [place] Number:  Chiropodist and Address:  [facility], [address], [place], [place] [address]      Provider Number: [id number]  Attending Physician Name and Address:  Darlin Priestly, MD  Relative Name and Phone Number:  Isabellamarie, Mellies (Spouse)  [phone] (Mobile)    Current Level of Care: Hospital Recommended Level of Care: Skilled Nursing Facility Prior Approval Number:    Date Approved/Denied:   PASRR Number: [id number]  Discharge Plan:      Current Diagnoses: Patient Active Problem List   Diagnosis Date Noted   Influenza A 09/25/2023   Chronic anemia 09/25/2023   Thrombocytopenia (HCC) 09/25/2023   Anticoagulated on Coumadin 09/25/2023   COPD with acute exacerbation (HCC) 09/25/2023   Elevated troponin 09/25/2023   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 09/25/2023   Status cardiac pacemaker secondary to postoperative complete heart block 09/25/2023   Physical deconditioning/frailty 09/25/2023   Non-healing ulcer of lower leg, right, limited to breakdown of skin (HCC) 01/24/2023   Macular degeneration 12/10/2022   Chronic respiratory failure with hypoxia (HCC) 04/08/2022   Gastroesophageal reflux disease 02/19/2022   Sciatica of left side 08/21/2021   Aortoiliac occlusive disease (HCC) 05/22/2021   Cochlear implant in place 05/22/2021   Acquired thrombophilia (HCC) 08/11/2020   Stage 3a chronic kidney disease (CKD) (HCC) 08/11/2020   Primary insomnia 07/31/2019   Acute on chronic respiratory failure with hypoxia (HCC) 02/04/2019   Osteoporosis, postmenopausal 01/23/2018   Coronary artery disease involving native coronary artery of native heart with angina pectoris (HCC) 01/13/2017   S/P placement of cardiac  pacemaker 05/02/15, medtronic 05/03/2015   Bradycardia 05/03/2015   Presence of cardiac pacemaker 05/03/2015   S/P mitral valve replacement with bioprosthetic valve, tricuspid valve repair, maze procedure and CABG x1 04/25/2015   S/P tricuspid valve repair 04/25/2015   Atrial fibrillation-persistent, with history of maze procedure 04/25/2015   CAD s/p CABGx1 04/25/2015   Pulmonary hypertension (HCC)    Tricuspid regurgitation    Chronic combined systolic and diastolic CHF (congestive heart failure) (HCC)    Chronic systolic heart failure (HCC) 11/10/2014   Essential hypertension 11/10/2014   Mitral regurgitation 11/10/2014   Hyperlipidemia 11/10/2014   Prediabetes 05/08/2014   Auditory vertigo 05/08/2014   Adult hypothyroidism 05/08/2014   H/O adenomatous polyp of colon 05/08/2014   Stage 4 very severe COPD by GOLD classification (HCC) 03/28/2014    Orientation RESPIRATION BLADDER Height & Weight     Self, Time, Situation, Place  O2 Continent Weight: 106 lb 7.7 oz (48.3 kg) Height:  5\' 1"  (154.9 cm)  BEHAVIORAL SYMPTOMS/MOOD NEUROLOGICAL BOWEL NUTRITION STATUS        Diet (regular)  AMBULATORY STATUS COMMUNICATION OF NEEDS Skin     Verbally Normal                       Personal Care Assistance Level of Assistance  Bathing, Feeding, Dressing Bathing Assistance: Limited assistance Feeding assistance: Independent Dressing Assistance: Limited assistance     Functional Limitations Info             SPECIAL CARE FACTORS FREQUENCY  PT (By licensed PT), OT (By licensed OT)     PT Frequency: 5 times per week OT Frequency: 5 times per week  Contractures      Additional Factors Info  Code Status, Allergies Code Status Info: Do not attempt resuscitation (DNR) -DNR-LIMITED -Do Not Intubate/DNI Allergies Info: Ace Inhibitors, Amoxicillin-pot Clavulanate, Penicillins, Rosuvastatin, Nifedipine, Propranolol, Diazepam, Meperidine, Propoxyphene            Current Medications (09/28/2023):  This is the current hospital active medication list Current Facility-Administered Medications  Medication Dose Route Frequency Provider Last Rate Last Admin   acetaminophen (TYLENOL) tablet 650 mg  650 mg Oral Q6H PRN Andris Baumann, MD       Or   acetaminophen (TYLENOL) suppository 650 mg  650 mg Rectal Q6H PRN Andris Baumann, MD       amiodarone (PACERONE) tablet 200 mg  200 mg Oral Daily Lindajo Royal V, MD   200 mg at 09/28/23 0932   bisoprolol (ZEBETA) tablet 10 mg  10 mg Oral Daily Darlin Priestly, MD   10 mg at 09/28/23 6578   diphenhydrAMINE (BENADRYL) capsule 25 mg  25 mg Oral Q6H PRN Andris Baumann, MD       docusate sodium (COLACE) capsule 100 mg  100 mg Oral BID PRN Andris Baumann, MD       feeding supplement (ENSURE ENLIVE / ENSURE PLUS) liquid 237 mL  237 mL Oral BID BM Darlin Priestly, MD   237 mL at 09/27/23 1400   gabapentin (NEURONTIN) capsule 200 mg  200 mg Oral QHS Lindajo Royal V, MD   200 mg at 09/27/23 2040   guaiFENesin (MUCINEX) 12 hr tablet 600 mg  600 mg Oral BID Lindajo Royal V, MD   600 mg at 09/28/23 0932   levalbuterol (XOPENEX) nebulizer solution 0.63 mg  0.63 mg Nebulization Q6H PRN Darlin Priestly, MD       levothyroxine (SYNTHROID) tablet 88 mcg  88 mcg Oral Q0600 Lowella Bandy, RPH   88 mcg at 09/28/23 4696   oseltamivir (TAMIFLU) capsule 30 mg  30 mg Oral BID Andris Baumann, MD   30 mg at 09/28/23 0932   polyethylene glycol (MIRALAX / GLYCOLAX) packet 34 g  34 g Oral BID Darlin Priestly, MD   34 g at 09/28/23 0933   predniSONE (DELTASONE) tablet 40 mg  40 mg Oral Q breakfast Andris Baumann, MD   40 mg at 09/28/23 0932   simvastatin (ZOCOR) tablet 10 mg  10 mg Oral q1800 Andris Baumann, MD   10 mg at 09/27/23 1826   sodium chloride (OCEAN) 0.65 % nasal spray 1 spray  1 spray Each Nare PRN Darlin Priestly, MD   1 spray at 09/26/23 1210   spironolactone (ALDACTONE) tablet 25 mg  25 mg Oral Daily Andris Baumann, MD   25 mg at 09/28/23 0932    warfarin (COUMADIN) tablet 1 mg  1 mg Oral ONCE-1600 Angelique Blonder, [facility]       Warfarin - Pharmacist Dosing Inpatient   Does not apply [id number] Andris Baumann, MD         Discharge Medications: Please see discharge summary for a list of discharge medications.  Relevant Imaging Results:  Relevant Lab Results:   Additional Information SS #: [id number]  Imogine Carvell E Malikiah Debarr, LCSW

## 2023-09-28 NOTE — Progress Notes (Signed)
  PROGRESS NOTE    [name]  BMW:[id number] DOB: 05-08-1935 DOA: 09/24/2023 PCP: Reubin Milan, MD  223A/[id number]-AA  LOS: 3 days   Brief hospital course:   Assessment & Plan: [name] is a 89 y.o. female with medical history significant for Stage 4 COPD on home O2 at 2 L, CAD, chronic A-fib on Coumadin, with history of maze procedure, systolic CHF, mitral and tricuspid valve repair, hypothyroidism, diabetes, anemia of chronic disease, who presents to the ED by EMS with worsening shortness of breath leading her to bump her oxygen up to 3 L.  No help with inhalers.  Also developed cough and congestion.  Husband has similar symptoms.     Influenza A --cont Tamiflu --no need for abx  COPD exacerbation 2/2 flu --s/p Xopenex and atrovent nebs (to reduce tachycardia), now d/c'ed by RT --cont prednisone 40 mg daily  Acute on chronic respiratory failure with hypoxia (HCC) On 2L O2 at baseline --Continue supplemental O2 to keep sats between 88-92%, wean to home 2L  Pulmonary Hypertension on prior echo --cont spironolactone  --IV lasix 40 BID today  Elevated troponin Troponin elevated without complaints of chest pain, likely demand ischemia  CAD s/p CABGx1 --cont statin  Atrial fibrillation-persistent, with history of maze procedure Pacemaker present Chronic warfarin anticoagulation --having persistent sinus tachycardia --cont amiodarone --increase bisoprolol to 10 mg daily --cont warfrain per pharm  Chronic systolic heart failure (HCC) Cardiomyopathy- EF 30 to 35% 06/2023 Continue current new GDMT with spironolactone, Lasix and bisoprolol --hold Entresto due to soft BP  Physical deconditioning/frailty Secondary to multiple chronic cardiopulmonary conditions --PT  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 Pacemaker status secondary to postoperative complete heart block No acute issues suspected Last seen by cardiology 09/16/2023,  Dr. Graciela Husbands  Thrombocytopenia [facility]) Mild worsening of chronic thrombocytopenia Mild thrombocytopenia of 109,000 (baseline 136-141,000).  Chronic anemia Hemoglobin at baseline  Hypokalemia --monitor and supplement PRN   DVT prophylaxis: UV:OZDGUYQI Code Status: DNR  Family Communication:  Level of care: Med-Surg Dispo:   The patient is from: home Anticipated d/c is to: SNF rehab Anticipated d/c date is: 1-2 days   Subjective and Interval History:  Pt reported feeling better.   Objective: Vitals:   09/28/23 0121 09/28/23 0356 09/28/23 0742 09/28/23 1508  BP: 112/81 124/80 117/71 115/66  Pulse: (!) 126 (!) 126 (!) 130 (!) 124  Resp: 20 16 18 18   Temp: 98.6 F (37 C) (!) 97.2 F (36.2 C) 98.6 F (37 C) 98 F (36.7 C)  TempSrc: Oral Oral Oral   SpO2: 100% 100% 100% 97%  Weight:      Height:        Intake/Output Summary (Last 24 hours) at 09/28/2023 1754 Last data filed at 09/28/2023 1000 Gross per 24 hour  Intake 240 ml  Output 200 ml  Net 40 ml   Filed Weights   09/24/23 1919  Weight: 48.3 kg    Examination:   Constitutional: NAD, AAOx3, sitting in recliner HEENT: conjunctivae and lids normal, EOMI CV: No cyanosis.   RESP: normal respiratory effort, on 4L Extremities: No effusions, edema in BLE SKIN: warm, dry Neuro: II - XII grossly intact.   Psych: Normal mood and affect.  Appropriate judgement and reason   Data Reviewed: I have personally reviewed labs and imaging studies  Time spent: 35 minutes  Darlin Priestly, MD [facility] If 7PM-7AM, please contact night-coverage 09/28/2023, 5:54 PM

## 2023-09-28 NOTE — TOC Initial Note (Signed)
Transition of Care [facility]) - Initial/Assessment Note    Patient Details  Name: [name] MRN: [id number] Date of Birth: 1935/08/04  [facility] CM/SW Contact:    Liliana Cline, LCSW Phone Number: 09/28/2023, 10:44 AM  Clinical Narrative:                 Patient is from home with spouse. PCP is Dr. Judithann Graves. [facility]. Patient has home o2 through [place]. Patient and spouse are agreeable to SNF. They would like a local bed search started and to review Medicare ratings of bed offers received. CSW started SNF work up.  Expected Discharge Plan: Skilled Nursing Facility Barriers to Discharge: Continued Medical Work up   Patient Goals and CMS Choice Patient states their goals for this hospitalization and ongoing recovery are:: SNF CMS Medicare.gov Compare Post Acute Care list provided to:: Patient Represenative (must comment) Choice offered to / list presented to : Patient, Spouse      Expected Discharge Plan and Services       Living arrangements for the past 2 months: Single Family Home                                      Prior Living Arrangements/Services Living arrangements for the past 2 months: Single Family Home Lives with:: Spouse Patient language and need for interpreter reviewed:: Yes        Need for Family Participation in Patient Care: Yes (Comment) Care giver support system in place?: Yes (comment) Current home services: DME Criminal Activity/Legal Involvement Pertinent to Current Situation/Hospitalization: No - Comment as needed  Activities of Daily Living   ADL Screening (condition at time of admission) Independently performs ADLs?: Yes (appropriate for developmental age) Is the patient deaf or have difficulty hearing?: Yes Does the patient have difficulty seeing, even when wearing glasses/contacts?: No Does the patient have difficulty concentrating, remembering, or making decisions?: No  Permission Sought/Granted                   Emotional Assessment       Orientation: : Oriented to Self, Oriented to Place, Oriented to  Time, Oriented to Situation Alcohol / Substance Use: Not Applicable Psych Involvement: No (comment)  Admission diagnosis:  Influenza A [J10.1] COPD exacerbation (HCC) [J44.1] Influenza [J11.1] Patient Active Problem List   Diagnosis Date Noted   Influenza A 09/25/2023   Chronic anemia 09/25/2023   Thrombocytopenia (HCC) 09/25/2023   Anticoagulated on Coumadin 09/25/2023   COPD with acute exacerbation (HCC) 09/25/2023   Elevated troponin 09/25/2023   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 09/25/2023   Status cardiac pacemaker secondary to postoperative complete heart block 09/25/2023   Physical deconditioning/frailty 09/25/2023   Non-healing ulcer of lower leg, right, limited to breakdown of skin (HCC) 01/24/2023   Macular degeneration 12/10/2022   Chronic respiratory failure with hypoxia (HCC) 04/08/2022   Gastroesophageal reflux disease 02/19/2022   Sciatica of left side 08/21/2021   Aortoiliac occlusive disease (HCC) 05/22/2021   Cochlear implant in place 05/22/2021   Acquired thrombophilia (HCC) 08/11/2020   Stage 3a chronic kidney disease (CKD) (HCC) 08/11/2020   Primary insomnia 07/31/2019   Acute on chronic respiratory failure with hypoxia (HCC) 02/04/2019   Osteoporosis, postmenopausal 01/23/2018   Coronary artery disease involving native coronary artery of native heart with angina pectoris (HCC) 01/13/2017   S/P placement  of cardiac pacemaker 05/02/15, medtronic 05/03/2015   Bradycardia 05/03/2015   Presence of cardiac pacemaker 05/03/2015   S/P mitral valve replacement with bioprosthetic valve, tricuspid valve repair, maze procedure and CABG x1 04/25/2015   S/P tricuspid valve repair 04/25/2015   Atrial fibrillation-persistent, with history of maze procedure 04/25/2015   CAD s/p CABGx1 04/25/2015   Pulmonary hypertension  (HCC)    Tricuspid regurgitation    Chronic combined systolic and diastolic CHF (congestive heart failure) (HCC)    Chronic systolic heart failure (HCC) 11/10/2014   Essential hypertension 11/10/2014   Mitral regurgitation 11/10/2014   Hyperlipidemia 11/10/2014   Prediabetes 05/08/2014   Auditory vertigo 05/08/2014   Adult hypothyroidism 05/08/2014   H/O adenomatous polyp of colon 05/08/2014   Stage 4 very severe COPD by GOLD classification (HCC) 03/28/2014   PCP:  Reubin Milan, MD [phone] [place] [place] - [address] [place] [address] Phone: [phone] Fax: [phone]     Social Drivers of Health (SDOH) Social History: SDOH Screenings   Food Insecurity: No Food Insecurity (09/26/2023)  Housing: Patient Declined (09/26/2023)  Transportation Needs: Patient Declined (09/26/2023)  Utilities: Not At Risk (09/26/2023)  Alcohol Screen: Low Risk  (10/01/2021)  Depression (PHQ2-9): Low Risk  (04/09/2023)  Financial Resource Strain: Low Risk  (11/15/2022)  Physical Activity: Inactive (10/04/2022)  Social Connections: Moderately Isolated (09/26/2023)  Stress: No Stress Concern Present (10/04/2022)  Tobacco Use: Medium Risk (09/24/2023)   SDOH Interventions:     Readmission Risk Interventions     No data to display

## 2023-09-28 NOTE — Plan of Care (Signed)

## 2023-09-28 NOTE — Progress Notes (Signed)
PHARMACY - ANTICOAGULATION CONSULT NOTE  Pharmacy Consult for Warfarin  Indication: atrial fibrillation  Allergies  Allergen Reactions   Ace Inhibitors Cough and Other (See Comments)   Amoxicillin-Pot Clavulanate Swelling and Other (See Comments)    SWOLLEN JOINTS  Has patient had a PCN reaction causing immediate rash, facial/tongue/throat swelling, SOB or lightheadedness with hypotension: Yes Has patient had a PCN reaction causing severe rash involving mucus membranes or skin necrosis: No Has patient had a PCN reaction that required hospitalization No Has patient had a PCN reaction occurring within the last 10 years: No If all of the above answers are "NO", then may proceed with Cephalosporin   Penicillins Other (See Comments) and Swelling    SWOLLEN JOINTS   Has patient had a PCN reaction causing immediate rash, facial/tongue/throat swelling, SOB or lightheadedness with hypotension: Yes  Has patient had a PCN reaction causing severe rash involving mucus membranes or skin necrosis: No  Has patient had a PCN reaction that required hospitalization No  Has patient had a PCN reaction occurring within the last 10 years: No  If all of the above answers are "NO", then may proceed with Cephalosporin use.  Swollen joints  Swollen joints, Has patient had a PCN reaction causing immediate rash, facial/tongue/throat swelling, SOB or lightheadedness with hypotension: Yes, Has patient had a PCN reaction causing severe rash involving mucus membranes or skin necrosis: No, Has patient had a PCN reaction that required hospitalization No, Has patient had a PCN reaction occurring within the last 10 years: No, If all of the above answers are "NO", then may proceed with Cephalosporin use.   Rosuvastatin Other (See Comments)   Nifedipine Other (See Comments)    UNSPECIFIED   Propranolol Other (See Comments)    UNSPECIFIED   Diazepam Other (See Comments)    Made her "hyper"    Meperidine Other (See  Comments)    Made her "hyper"     Propoxyphene Other (See Comments)    Made her "hyper"     Patient Measurements: Height: 5\' 1"  (154.9 cm) Weight: 48.3 kg (106 lb 7.7 oz) IBW/kg (Calculated) : 47.8  Vital Signs: Temp: 98.6 F (37 C) (01/19 0742) Temp Source: Oral (01/19 0742) BP: 117/71 (01/19 0742) Pulse Rate: 130 (01/19 0742)  Labs: Recent Labs    09/26/23 0448 09/27/23 0547 09/28/23 0452  HGB 9.6* 10.4*  --   HCT 28.8* 32.0*  --   PLT 126* 169  --   LABPROT 43.0* 28.3* 22.4*  INR 4.5* 2.6* 1.9*  CREATININE 0.69 0.75  --    Estimated Creatinine Clearance: 38.1 mL/min (by C-G formula based on SCr of 0.75 mg/dL).  Medical History: Past Medical History:  Diagnosis Date   Acute on chronic respiratory failure with hypoxia (HCC) 02/04/2019   Added automatically from request for surgery [phone]   Acute on chronic systolic heart failure (HCC)    Anxiety    Arthritis    "hands" (11/10/2015)   Breast cancer (HCC) 2002   right   CAD s/p CABG x 1    a. 04/2015 LIMA to LAD   Chronic combined systolic (congestive) and diastolic (congestive) heart failure (HCC)    a. 01/2015 Echo: EF 50-55%, Gr2 DD (preop valve surgery); b. 08/2015 Echo: EF 30-35%, diff HK. Gr2 DD; c. 09/2016 Echo: EF 50-55%; d. 07/2018 Echo: EF 20-25%; e. 03/2019 Echo: EF 35-40%, diff HK. Mod red RV fxn, RVSP 34.41mmHg. Mildly dil LA. Nl MV prosthesis. Triv AI.   COPD (chronic  obstructive pulmonary disease) (HCC)    Coronary artery disease    GERD (gastroesophageal reflux disease)    History of colon polyps    History of mitral valve replacement    a. 04/2015 s/p 27 mm [facility] Mitral bovine bioprosthetic tissue valve   Hyperlipidemia    Hypertension    Hypothyroidism    Maze operation for AF w/ LAA clipping    a. 04/2015 Complete bilateral atrial lesion set using cryothermy and bipolar radiofrequency ablation with clipping of LA appendage   Meniere disease    Meniere's disease    On home oxygen therapy     "2L at night" (11/10/2015)   PAF (paroxysmal atrial fibrillation) (HCC)    Personal history of radiation therapy    Pneumonia ~ 2010   PONV (postoperative nausea and vomiting)    Pt felt like eardrums were gonna pop   Post-surgical complete heart block, symptomatic    a. 04/2015 s/p MDT [id number] Advisa DR MRI DC PPM.   Presence of permanent cardiac pacemaker    Pulmonary hypertension (HCC)    Respiratory failure (HCC) 02/22/2019   Surgical wound, non healing - chest wall 11/10/2015   Tricuspid Regurgitation s/p Repair    a. 04/2015 s/p 26 mm Edwards mc3 ring annuloplasty   Wound infection 10/13/2015   Superficial sternal wound infection   Medications Patient is on warfarin 2.5 mg PO MWF and 1.25 mg PO TThSSu Last dose 1/15 in the PM  Assessment: NM is a 89 yo female who presented with worsening shortness of breath leading to increased oxygen requirements. They were admitted for COPD exacerbation and tested positive for Influenza A. They have a past medication history significant for atrial fibrillation treated with warfarin. Pharmacy consulted to dose warfarin.  DDIs: amiodarone (on PTA), steroids  Goal of Therapy:  INR 2-3  INR Date/Time INR Clinical Assessment Dose given  1/15 INR 2.8 Therapeutic HOLD  1/16 INR 3.2 SUPRAtherapuetic HOLD  1/17  INR 4.5 SUPRAtherapuetic HOLD  1/18 INR 2.6 Therapeutic 2.5 mg  1/19 INR 1.9 subtherapeutic         Plan:  Warfarin is slightly subtherapeutic today (after holding doses x 3 days-see above) Will order warfarin 1 mg po x 1   Recheck INR and CBC with AM labs  [name] A, PharmD 09/28/2023 9:19 AM

## 2023-09-29 DIAGNOSIS — J101 Influenza due to other identified influenza virus with other respiratory manifestations: Secondary | ICD-10-CM | POA: Diagnosis not present

## 2023-09-29 LAB — BASIC METABOLIC PANEL
Anion gap: 10 (ref 5–15)
BUN: 47 mg/dL — ABNORMAL HIGH (ref 8–23)
CO2: 39 mmol/L — ABNORMAL HIGH (ref 22–32)
Calcium: 8.8 mg/dL — ABNORMAL LOW (ref 8.9–10.3)
Chloride: 93 mmol/L — ABNORMAL LOW (ref 98–111)
Creatinine, Ser: 0.97 mg/dL (ref 0.44–1.00)
GFR, Estimated: 57 mL/min — ABNORMAL LOW (ref >60)
Glucose, Bld: 106 mg/dL — ABNORMAL HIGH (ref 70–99)
Potassium: 4.9 mmol/L (ref 3.5–5.1)
Sodium: 142 mmol/L (ref 135–145)

## 2023-09-29 LAB — PROTIME-INR
INR: 2.2 — ABNORMAL HIGH (ref 0.8–1.2)
Prothrombin Time: 24.6 s — ABNORMAL HIGH (ref 11.4–15.2)

## 2023-09-29 LAB — MAGNESIUM: Magnesium: 2.4 mg/dL (ref 1.7–2.4)

## 2023-09-29 MED ORDER — ORAL CARE MOUTH RINSE: 15.0000 mL | OROMUCOSAL | Status: DC | PRN

## 2023-09-29 MED ORDER — WARFARIN 1.25 MG HALF TABLET: 1.2500 mg | ORAL_TABLET | Freq: Once | ORAL | Status: DC

## 2023-09-29 MED ORDER — GUAIFENESIN-DM 100-10 MG/5ML PO SYRP
10.0000 mL | ORAL_SOLUTION | Freq: Four times a day (QID) | ORAL | Status: DC | PRN
  Administered 2023-09-29 – 2023-09-30 (×3): 10 mL via ORAL
  Filled 2023-09-29 (×3): qty 10

## 2023-09-29 MED ORDER — WARFARIN SODIUM 1 MG PO TABS
1.0000 mg | ORAL_TABLET | Freq: Once | ORAL | Status: AC
  Administered 2023-09-29: 1 mg via ORAL
  Filled 2023-09-29: qty 1

## 2023-09-29 MED ORDER — BISOPROLOL FUMARATE 5 MG PO TABS
5.0000 mg | ORAL_TABLET | Freq: Every day | ORAL | Status: DC
  Administered 2023-09-30 – 2023-10-01 (×2): 5 mg via ORAL
  Filled 2023-09-29 (×2): qty 1

## 2023-09-29 NOTE — Plan of Care (Signed)

## 2023-09-29 NOTE — Progress Notes (Addendum)
PHARMACY - ANTICOAGULATION CONSULT NOTE  Pharmacy Consult for Warfarin  Indication: atrial fibrillation  Allergies  Allergen Reactions   [name] Cough and Other (See Comments)   [name] Swelling and Other (See Comments)    SWOLLEN JOINTS  Has patient had a PCN reaction causing immediate rash, facial/tongue/throat swelling, SOB or lightheadedness with hypotension: Yes Has patient had a PCN reaction causing severe rash involving mucus membranes or skin necrosis: No Has patient had a PCN reaction that required hospitalization No Has patient had a PCN reaction occurring within the last 10 years: No If all of the above answers are "NO", then may proceed with Cephalosporin   Penicillins Other (See Comments) and Swelling    SWOLLEN JOINTS   Has patient had a PCN reaction causing immediate rash, facial/tongue/throat swelling, SOB or lightheadedness with hypotension: Yes  Has patient had a PCN reaction causing severe rash involving mucus membranes or skin necrosis: No  Has patient had a PCN reaction that required hospitalization No  Has patient had a PCN reaction occurring within the last 10 years: No  If all of the above answers are "NO", then may proceed with Cephalosporin use.  Swollen joints  Swollen joints, Has patient had a PCN reaction causing immediate rash, facial/tongue/throat swelling, SOB or lightheadedness with hypotension: Yes, Has patient had a PCN reaction causing severe rash involving mucus membranes or skin necrosis: No, Has patient had a PCN reaction that required hospitalization No, Has patient had a PCN reaction occurring within the last 10 years: No, If all of the above answers are "NO", then may proceed with Cephalosporin use.   [name] Other (See Comments)   [name] Other (See Comments)    UNSPECIFIED   [name] Other (See Comments)    UNSPECIFIED   [name] Other (See Comments)    Made her "hyper"    [name] Other (See  Comments)    Made her "hyper"     [name] Other (See Comments)    Made her "hyper"     Patient Measurements: Height: 5\' 1"  (154.9 cm) Weight: 48.3 kg (106 lb 7.7 oz) IBW/kg (Calculated) : 47.8  Vital Signs: Temp: 97.8 F (36.6 C) (01/20 0818) Temp Source: Oral (01/20 0818) BP: 126/68 (01/20 0818) Pulse Rate: 69 (01/20 0818)  Labs: Recent Labs    09/27/23 0547 09/28/23 0452 09/29/23 0429  HGB 10.4*  --   --   HCT 32.0*  --   --   PLT 169  --   --   LABPROT 28.3* 22.4* 24.6*  INR 2.6* 1.9* 2.2*  CREATININE 0.75  --  0.97   Estimated Creatinine Clearance: 31.4 mL/min (by C-G formula based on SCr of 0.97 mg/dL).  Medical History: Past Medical History:  Diagnosis Date   Acute on chronic respiratory failure with hypoxia (HCC) 02/04/2019   Added automatically from request for surgery [phone]   Acute on chronic systolic heart failure (HCC)    Anxiety    Arthritis    "hands" (11/10/2015)   Breast cancer (HCC) 2002   right   CAD s/p CABG x 1    a. 04/2015 LIMA to LAD   Chronic combined systolic (congestive) and diastolic (congestive) heart failure (HCC)    a. 01/2015 Echo: EF 50-55%, Gr2 DD (preop valve surgery); b. 08/2015 Echo: EF 30-35%, diff HK. Gr2 DD; c. 09/2016 Echo: EF 50-55%; d. 07/2018 Echo: EF 20-25%; e. 03/2019 Echo: EF 35-40%, diff HK. Mod red RV fxn, RVSP 34.24mmHg. Mildly dil LA. Nl MV prosthesis.  Triv AI.   COPD (chronic obstructive pulmonary disease) (HCC)    Coronary artery disease    GERD (gastroesophageal reflux disease)    History of colon polyps    History of mitral valve replacement    a. 04/2015 s/p 27 mm [facility] Mitral bovine bioprosthetic tissue valve   Hyperlipidemia    Hypertension    Hypothyroidism    Maze operation for AF w/ LAA clipping    a. 04/2015 Complete bilateral atrial lesion set using cryothermy and bipolar radiofrequency ablation with clipping of LA appendage   Meniere disease    Meniere's disease    On home oxygen therapy     "2L at night" (11/10/2015)   PAF (paroxysmal atrial fibrillation) (HCC)    Personal history of radiation therapy    Pneumonia ~ 2010   PONV (postoperative nausea and vomiting)    Pt felt like eardrums were gonna pop   Post-surgical complete heart block, symptomatic    a. 04/2015 s/p MDT [id number] Advisa DR MRI DC PPM.   Presence of permanent cardiac pacemaker    Pulmonary hypertension (HCC)    Respiratory failure (HCC) 02/22/2019   Surgical wound, non healing - chest wall 11/10/2015   Tricuspid Regurgitation s/p Repair    a. 04/2015 s/p 26 mm Edwards mc3 ring annuloplasty   Wound infection 10/13/2015   Superficial sternal wound infection   Medications Patient is on warfarin 2.5 mg PO MWF and 1.25 mg PO TThSSu Last dose 1/15 in the PM  Assessment: [name] is a 89 yo female who presented with worsening shortness of breath leading to increased oxygen requirements. They were admitted for COPD exacerbation and tested positive for Influenza A. They have a past medication history significant for atrial fibrillation treated with warfarin. Pharmacy consulted to dose warfarin.  DDIs: amiodarone (on PTA but was only taking 5 days/week; now ordered as daily), steroids  Goal of Therapy:  INR 2-3  INR Date INR Clinical Assessment Dose given  1/15 INR 2.8 Therapeutic HOLD  1/16 INR 3.2 SUPRAtherapuetic HOLD  1/17  INR 4.5 SUPRAtherapuetic HOLD  1/18 INR 2.6 Therapeutic 2.5 mg  1/19 INR 1.9 subtherapeutic 1 mg  1/20 INR 2.2 Therapeutic  1 mg (ordered)   Plan:  Warfarin is therapeutic today, patient may require less warfarin than prior if amiodarone dose remains daily vs how she was taking it PTA (5 days/week)  Will order warfarin 1 mg po x 1   Recheck INR and CBC with AM labs  [name], PharmD 09/29/2023 8:33 AM

## 2023-09-29 NOTE — Progress Notes (Signed)
  PROGRESS NOTE    [name]  ZOX:[id number] DOB: 13-Nov-1934 DOA: 09/24/2023 PCP: Reubin Milan, MD  223A/223A-AA  LOS: 4 days   Brief hospital course:   Assessment & Plan: [name] is a 89 y.o. female with medical history significant for Stage 4 COPD on home O2 at 2 L, CAD, chronic A-fib on Coumadin, with history of maze procedure, systolic CHF, mitral and tricuspid valve repair, hypothyroidism, diabetes, anemia of chronic disease, who presents to the ED by EMS with worsening shortness of breath leading her to bump her oxygen up to 3 L.  No help with inhalers.  Also developed cough and congestion.  Husband has similar symptoms.     Influenza A --completed a course of Tamiflu  COPD exacerbation 2/2 flu --s/p Xopenex and atrovent nebs (to reduce tachycardia), now d/c'ed by RT --cont prednisone  Acute on chronic respiratory failure with hypoxia (HCC) On 2L O2 at baseline --Continue supplemental O2 to keep sats between 88-92%, wean to home 2L  Pulmonary Hypertension on prior echo --cont spironolactone   Elevated troponin Troponin elevated without complaints of chest pain, likely demand ischemia  CAD s/p CABGx1 --cont statin  Atrial fibrillation-persistent, with history of maze procedure Pacemaker present Chronic warfarin anticoagulation --having persistent sinus tachycardia, suddenly resolved today after bisoprolol increased. --cont amiodarone --reduce bisoprolol back down to 5 mg daily --cont warfrain per pharm  Chronic systolic heart failure (HCC) Cardiomyopathy- EF 30 to 35% 06/2023 Continue current new GDMT with spironolactone, Lasix and bisoprolol --hold Entresto due to soft BP  Physical deconditioning/frailty Secondary to multiple chronic cardiopulmonary conditions --PT  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 Pacemaker status secondary to postoperative complete heart block No acute issues suspected Last seen by  cardiology 09/16/2023, Dr. Graciela Husbands  Thrombocytopenia [facility]) Mild worsening of chronic thrombocytopenia Mild thrombocytopenia of 109,000 (baseline 136-141,000).  Chronic anemia Hemoglobin at baseline  Hypokalemia --monitor and supplement PRN   DVT prophylaxis: WJ:XBJYNWGN Code Status: DNR  Family Communication:  Level of care: Med-Surg Dispo:   The patient is from: home Anticipated d/c is to: SNF rehab Anticipated d/c date is: whenever bed available   Subjective and Interval History:  Pt reported more sputum production today.   Objective: Vitals:   09/29/23 1544 09/29/23 1613 09/29/23 1615 09/29/23 2032  BP: (!) 143/60   130/75  Pulse: 62   61  Resp: 18   16  Temp: 97.7 F (36.5 C)   98.2 F (36.8 C)  TempSrc:    Oral  SpO2: 91% 91% 93% 97%  Weight:      Height:        Intake/Output Summary (Last 24 hours) at 09/29/2023 2054 Last data filed at 09/28/2023 2102 Gross per 24 hour  Intake --  Output 400 ml  Net -400 ml   Filed Weights   09/24/23 1919  Weight: 48.3 kg    Examination:   Constitutional: NAD, AAOx3 HEENT: conjunctivae and lids normal, EOMI CV: No cyanosis.   RESP: normal respiratory effort, on 4L Neuro: II - XII grossly intact.   Psych: Normal mood and affect.  Appropriate judgement and reason   Data Reviewed: I have personally reviewed labs and imaging studies  Time spent: 35 minutes  Darlin Priestly, MD [facility] If 7PM-7AM, please contact night-coverage 09/29/2023, 8:54 PM

## 2023-09-30 DIAGNOSIS — J101 Influenza due to other identified influenza virus with other respiratory manifestations: Secondary | ICD-10-CM | POA: Diagnosis not present

## 2023-09-30 LAB — CBC
HCT: 32.8 % — ABNORMAL LOW (ref 36.0–46.0)
Hemoglobin: 10.7 g/dL — ABNORMAL LOW (ref 12.0–15.0)
MCH: 33.2 pg (ref 26.0–34.0)
MCHC: 32.6 g/dL (ref 30.0–36.0)
MCV: 101.9 fL — ABNORMAL HIGH (ref 80.0–100.0)
Platelets: 286 10*3/uL (ref 150–400)
RBC: 3.22 MIL/uL — ABNORMAL LOW (ref 3.87–5.11)
RDW: 13.1 % (ref 11.5–15.5)
WBC: 10 10*3/uL (ref 4.0–10.5)
nRBC: 0 % (ref 0.0–0.2)

## 2023-09-30 LAB — PROTIME-INR
INR: 2.1 — ABNORMAL HIGH (ref 0.8–1.2)
Prothrombin Time: 24.2 s — ABNORMAL HIGH (ref 11.4–15.2)

## 2023-09-30 MED ORDER — WARFARIN 1.25 MG HALF TABLET
1.2500 mg | ORAL_TABLET | Freq: Once | ORAL | Status: AC
  Administered 2023-09-30: 1.25 mg via ORAL
  Filled 2023-09-30: qty 1

## 2023-09-30 MED ORDER — FUROSEMIDE 10 MG/ML IJ SOLN
40.0000 mg | Freq: Once | INTRAMUSCULAR | Status: AC
  Administered 2023-09-30: 40 mg via INTRAVENOUS
  Filled 2023-09-30: qty 4

## 2023-09-30 NOTE — Progress Notes (Signed)
Physical Therapy Treatment Patient Details Name: [name] MRN: [id number] DOB: 1935-07-15 Today's Date: 09/30/2023   History of Present Illness Pt is an 89 y.o. female presenting to hospital 09/24/23 with c/o worsening SOB.  Pt admitted with influenza A, COPD exacerbation, acute on chronic respiratory failure with hypoxia, elevated troponin (likely demand ischemia per MD note), a-fib (h/o maze procedure).  PMH includes COPD on 2 L baseline, a-fib, CAD, HF, htn, pulmonary htn, R breast CA, Meniere's disease, cochlear implant, CABG.    PT Comments  Pt was long sitting in bed upon arrival. Sdhe was on 4 L o2. Agreeable to session and remains cooperative throughout. Pt is HOH but fully participates. Overall limited activity tolerance however was able to exit bed, stand, and ambulate with RW ~ 80 ft prior to seated rest. Discussed DC disposition with pt/spouse and care team. DC recs updated to home with [facility]. All parties are in agreement and plan for DC home set for tomorrow.    If plan is discharge home, recommend the following: A little help with walking and/or transfers;A little help with bathing/dressing/bathroom;Assistance with cooking/housework;Assist for transportation;Help with stairs or ramp for entrance     Equipment Recommendations  None recommended by PT (pt endorses having all equipment needs met except large portable O2 tank)       Precautions / Restrictions Precautions Precautions: Fall Precaution Comments: O2 Restrictions Weight Bearing Restrictions Per Provider Order: No     Mobility  Bed Mobility Overal bed mobility: Modified Independent Bed Mobility: Supine to Sit, Sit to Supine  Supine to sit: Modified independent (Device/Increase time) Sit to supine: Modified independent (Device/Increase time)   Transfers Overall transfer level: Needs assistance Equipment used: Rolling walker (2 wheels) Transfers: Sit to/from Stand Sit to Stand: Modified independent (Device/Increase  time)   Ambulation/Gait Ambulation/Gait assistance: Supervision Gait Distance (Feet): 80 Feet Assistive device: Rolling walker (2 wheels) Gait Pattern/deviations: Step-through pattern Gait velocity: decreased  General Gait Details: Pt was safely able to ambulate  4 laps in room on 4 L o2. sao2 >88% on 4 L. pt  encouraged to use RW at DC     Balance Overall balance assessment: Needs assistance Sitting-balance support: No upper extremity supported, Feet unsupported Sitting balance-Leahy Scale: Good     Standing balance support: Bilateral upper extremity supported, During functional activity, Reliant on assistive device for balance Standing balance-Leahy Scale: Good       Cognition Arousal: Alert Behavior During Therapy: WFL for tasks assessed/performed Overall Cognitive Status: Within Functional Limits for tasks assessed      General Comments: Pt is HOH but extremely pleasant and cooperative. sao2 > 91 on 4 L o2           General Comments General comments (skin integrity, edema, etc.): Author discussed POC with pt/spouse/MD/TOC. DC recs are being updated to home with HHPT. spouse and pt are agreeable. TOC to get larger portable tank delivered today prior to DC home tomorrow      Pertinent Vitals/Pain Pain Assessment Pain Assessment: No/denies pain     PT Goals (current goals can now be found in the care plan section) Acute Rehab PT Goals Patient Stated Goal: To improve strength and walking Progress towards PT goals: Progressing toward goals    Frequency    Min 1X/week       AM-PAC PT "6 Clicks" Mobility   Outcome Measure  Help needed turning from your back to your side while in a flat bed without using bedrails?: A Little  Help needed moving from lying on your back to sitting on the side of a flat bed without using bedrails?: A Little Help needed moving to and from a bed to a chair (including a wheelchair)?: A Little Help needed standing up from a chair using  your arms (e.g., wheelchair or bedside chair)?: A Little Help needed to walk in hospital room?: A Little Help needed climbing 3-5 steps with a railing? : A Lot 6 Click Score: 17    End of Session Equipment Utilized During Treatment: Oxygen (4L) Activity Tolerance: Patient tolerated treatment well;Patient limited by fatigue Patient left: in bed;with call bell/phone within reach;with bed alarm set;with family/visitor present Nurse Communication: Mobility status;Precautions;Other (comment) PT Visit Diagnosis: Other abnormalities of gait and mobility (R26.89);Muscle weakness (generalized) (M62.81)     Time: 8469-6295 PT Time Calculation (min) (ACUTE ONLY): 18 min  Charges:    $Gait Training: 8-22 mins PT General Charges $$ ACUTE PT VISIT: 1 Visit                     Jetta Lout PTA 09/30/23, 1:04 PM

## 2023-09-30 NOTE — Plan of Care (Signed)

## 2023-09-30 NOTE — Progress Notes (Signed)
1/21 Patient under Droplet Precautions, I spoke to patient's husband [name]) via telephone @[phone] and expalained contents of the IMM Letter and received verbal consent and acknowledgement.

## 2023-09-30 NOTE — Progress Notes (Signed)
PROGRESS NOTE    [name]  ZOX:[id number] DOB: 1934/10/20 DOA: 09/24/2023 PCP: Reubin Milan, MD  223A/223A-AA  LOS: 5 days   Brief hospital course:   Assessment & Plan: [name] is a 89 y.o. female with medical history significant for Stage 4 COPD on home O2 at 2 L, CAD, chronic A-fib on Coumadin, with history of maze procedure, systolic CHF, mitral and tricuspid valve repair, hypothyroidism, diabetes, anemia of chronic disease, who presents to the ED by EMS with worsening shortness of breath leading her to bump her oxygen up to 3 L.  No help with inhalers.  Also developed cough and congestion.  Husband has similar symptoms.    Influenza A --completed a course of Tamiflu --supportive care  COPD exacerbation 2/2 flu --s/p Xopenex and atrovent nebs (to reduce tachycardia), since d/c'ed by RT --completed a course of steroid burst  Acute on chronic respiratory failure with hypoxia (HCC) On 2L O2 at baseline --Continue supplemental O2 to keep sats between 88-92%, wean to home 2L  Pulmonary Hypertension on prior echo --cont spironolactone  --IV lasix 40 x1 today  Elevated troponin Troponin elevated without complaints of chest pain, likely demand ischemia  CAD s/p CABGx1 --cont statin  Atrial fibrillation-persistent, with history of maze procedure Pacemaker present Chronic warfarin anticoagulation --having persistent sinus tachycardia, resolved after bisoprolol increased. --cont amiodarone --cont bisoprolol as 5 mg daily --cont warfrain per pharm  Chronic systolic heart failure (HCC) Cardiomyopathy- EF 30 to 35% 06/2023 Continue current new GDMT with spironolactone, Lasix and bisoprolol --hold Entresto due to soft BP  Physical deconditioning/frailty Secondary to multiple chronic cardiopulmonary conditions --initially rec for SNF rehab, but today found to improve enough to go home.  History of tricuspid and mitral valve replacement with bioprosthetic  valve Pacemaker status secondary to postoperative complete heart block No acute issues suspected Last seen by cardiology 09/16/2023, Dr. Graciela Husbands  Thrombocytopenia [facility]) Mild worsening of chronic thrombocytopenia Mild thrombocytopenia of 109,000 (baseline 136-141,000).  Chronic anemia Hemoglobin at baseline  Hypokalemia --monitor and supplement PRN   DVT prophylaxis: WJ:XBJYNWGN Code Status: DNR  Family Communication:  Level of care: Med-Surg Dispo:   The patient is from: home Anticipated d/c is to: home Anticipated d/c date is: tomorrow   Subjective and Interval History:  Pt was put back on 4L due to O2 desat with coughing.  PT eval found pt much improved and now can go home instead of SNF rehab.   Objective: Vitals:   09/30/23 0517 09/30/23 0801 09/30/23 0806 09/30/23 1809  BP: 118/88 (!) 120/53  (!) 106/56  Pulse: 62 60  (!) 103  Resp: 20 20  16   Temp: 97.7 F (36.5 C) 97.8 F (36.6 C)  97.8 F (36.6 C)  TempSrc: Oral Oral  Oral  SpO2: 99% (!) 87% 93% 98%  Weight:      Height:        Intake/Output Summary (Last 24 hours) at 09/30/2023 1936 Last data filed at 09/30/2023 1600 Gross per 24 hour  Intake 480 ml  Output 450 ml  Net 30 ml   Filed Weights   09/24/23 1919  Weight: 48.3 kg    Examination:   Constitutional: NAD, AAOx3 HEENT: conjunctivae and lids normal, EOMI CV: No cyanosis.   RESP: normal respiratory effort, on 4L Neuro: II - XII grossly intact.   Psych: Normal mood and affect.  Appropriate judgement and reason   Data Reviewed: I have personally reviewed labs and imaging studies  Time spent:  35 minutes  Darlin Priestly, MD [facility] If 7PM-7AM, please contact night-coverage 09/30/2023, 7:36 PM

## 2023-09-30 NOTE — Care Management Important Message (Signed)
Important Message  Patient Details  Name: [name] MRN: [id number] Date of Birth: 07-02-1935   Important Message Given:  Other (see comment)     Sherilyn Banker 09/30/2023, 12:37 PM

## 2023-09-30 NOTE — Progress Notes (Signed)
PHARMACY - ANTICOAGULATION CONSULT NOTE  Pharmacy Consult for Warfarin  Indication: atrial fibrillation  Allergies  Allergen Reactions   Ace Inhibitors Cough and Other (See Comments)   Amoxicillin-Pot Clavulanate Swelling and Other (See Comments)    SWOLLEN JOINTS  Has patient had a PCN reaction causing immediate rash, facial/tongue/throat swelling, SOB or lightheadedness with hypotension: Yes Has patient had a PCN reaction causing severe rash involving mucus membranes or skin necrosis: No Has patient had a PCN reaction that required hospitalization No Has patient had a PCN reaction occurring within the last 10 years: No If all of the above answers are "NO", then may proceed with Cephalosporin   Penicillins Other (See Comments) and Swelling    SWOLLEN JOINTS   Has patient had a PCN reaction causing immediate rash, facial/tongue/throat swelling, SOB or lightheadedness with hypotension: Yes  Has patient had a PCN reaction causing severe rash involving mucus membranes or skin necrosis: No  Has patient had a PCN reaction that required hospitalization No  Has patient had a PCN reaction occurring within the last 10 years: No  If all of the above answers are "NO", then may proceed with Cephalosporin use.  Swollen joints  Swollen joints, Has patient had a PCN reaction causing immediate rash, facial/tongue/throat swelling, SOB or lightheadedness with hypotension: Yes, Has patient had a PCN reaction causing severe rash involving mucus membranes or skin necrosis: No, Has patient had a PCN reaction that required hospitalization No, Has patient had a PCN reaction occurring within the last 10 years: No, If all of the above answers are "NO", then may proceed with Cephalosporin use.   Rosuvastatin Other (See Comments)   Nifedipine Other (See Comments)    UNSPECIFIED   Propranolol Other (See Comments)    UNSPECIFIED   Diazepam Other (See Comments)    Made her "hyper"    Meperidine Other (See  Comments)    Made her "hyper"     Propoxyphene Other (See Comments)    Made her "hyper"     Patient Measurements: Height: 5\' 1"  (154.9 cm) Weight: 48.3 kg (106 lb 7.7 oz) IBW/kg (Calculated) : 47.8  Vital Signs: Temp: 97.7 F (36.5 C) (01/21 0517) Temp Source: Oral (01/21 0517) BP: 118/88 (01/21 0517) Pulse Rate: 62 (01/21 0517)  Labs: Recent Labs    09/28/23 0452 09/29/23 0429 09/30/23 0548  HGB  --   --  10.7*  HCT  --   --  32.8*  PLT  --   --  286  LABPROT 22.4* 24.6* 24.2*  INR 1.9* 2.2* 2.1*  CREATININE  --  0.97  --    Estimated Creatinine Clearance: 31.4 mL/min (by C-G formula based on SCr of 0.97 mg/dL).  Medical History: Past Medical History:  Diagnosis Date   Acute on chronic respiratory failure with hypoxia (HCC) 02/04/2019   Added automatically from request for surgery [id number]   Acute on chronic systolic heart failure (HCC)    Anxiety    Arthritis    "hands" (11/10/2015)   Breast cancer (HCC) 2002   right   CAD s/p CABG x 1    a. 04/2015 [facility] to LAD   Chronic combined systolic (congestive) and diastolic (congestive) heart failure (HCC)    a. 01/2015 Echo: EF 50-55%, Gr2 DD (preop valve surgery); b. 08/2015 Echo: EF 30-35%, diff HK. Gr2 DD; c. 09/2016 Echo: EF 50-55%; d. 07/2018 Echo: EF 20-25%; e. 03/2019 Echo: EF 35-40%, diff HK. Mod red RV fxn, RVSP 34.53mmHg. Mildly dil LA. Nl  MV prosthesis. Triv AI.   COPD (chronic obstructive pulmonary disease) (HCC)    Coronary artery disease    GERD (gastroesophageal reflux disease)    History of colon polyps    History of mitral valve replacement    a. 04/2015 s/p 27 mm [facility] Mitral bovine bioprosthetic tissue valve   Hyperlipidemia    Hypertension    Hypothyroidism    Maze operation for AF w/ LAA clipping    a. 04/2015 Complete bilateral atrial lesion set using cryothermy and bipolar radiofrequency ablation with clipping of LA appendage   Meniere disease    Meniere's disease    On home oxygen therapy     "2L at night" (11/10/2015)   PAF (paroxysmal atrial fibrillation) (HCC)    Personal history of radiation therapy    Pneumonia ~ 2010   PONV (postoperative nausea and vomiting)    Pt felt like eardrums were gonna pop   Post-surgical complete heart block, symptomatic    a. 04/2015 s/p MDT [id number] Advisa DR MRI DC PPM.   Presence of permanent cardiac pacemaker    Pulmonary hypertension (HCC)    Respiratory failure (HCC) 02/22/2019   Surgical wound, non healing - chest wall 11/10/2015   Tricuspid Regurgitation s/p Repair    a. 04/2015 s/p 26 mm Edwards mc3 ring annuloplasty   Wound infection 10/13/2015   Superficial sternal wound infection   Medications Patient is on warfarin 2.5 mg PO MWF and 1.25 mg PO TThSSu Last dose 1/15 in the PM  Assessment: NM is a 89 yo female who presented with worsening shortness of breath leading to increased oxygen requirements. They were admitted for COPD exacerbation and tested positive for Influenza A. They have a past medication history significant for atrial fibrillation treated with warfarin. Pharmacy consulted to dose warfarin.  DDIs: amiodarone (on PTA but was only taking 5 days/week; now ordered as daily), steroids  Goal of Therapy:  INR 2-3  INR Date INR Clinical Assessment Dose given  1/15 INR 2.8 Therapeutic HOLD  1/16 INR 3.2 SUPRAtherapuetic HOLD  1/17  INR 4.5 SUPRAtherapuetic HOLD  1/18 INR 2.6 Therapeutic 2.5 mg  1/19 INR 1.9 subtherapeutic 1 mg  1/20 INR 2.2 Therapeutic  1 mg  1/21 INR 2.1 Therapeutic 1.25 mg (ordered)   Plan:  Warfarin is therapeutic today, patient may require less warfarin than prior if amiodarone dose remains daily vs how she was taking it PTA (5 days/week), will continue to monitor Will order warfarin 1.25 mg po x 1   Recheck INR and CBC with AM labs  Merryl Hacker, PharmD 09/30/2023 7:54 AM

## 2023-10-01 ENCOUNTER — Ambulatory Visit: Payer: Medicare Other

## 2023-10-01 DIAGNOSIS — J101 Influenza due to other identified influenza virus with other respiratory manifestations: Secondary | ICD-10-CM | POA: Diagnosis not present

## 2023-10-01 LAB — PROTIME-INR
INR: 2 — ABNORMAL HIGH (ref 0.8–1.2)
Prothrombin Time: 22.6 s — ABNORMAL HIGH (ref 11.4–15.2)

## 2023-10-01 MED ORDER — GUAIFENESIN-DM 100-10 MG/5ML PO SYRP: 10.0000 mL | ORAL_SOLUTION | Freq: Four times a day (QID) | ORAL | Status: DC | PRN

## 2023-10-01 MED ORDER — ENSURE ENLIVE PO LIQD: 237.0000 mL | Freq: Two times a day (BID) | ORAL | Status: AC

## 2023-10-01 MED ORDER — WARFARIN SODIUM 2.5 MG PO TABS
2.5000 mg | ORAL_TABLET | Freq: Once | ORAL | Status: DC
  Filled 2023-10-01: qty 1

## 2023-10-01 NOTE — Progress Notes (Signed)
[facility] is being [facility]. O2 has been delivered to patient room. Discharge instructions have has been given to patient and patient and husband have been educated on discharge instructions. IV has been removed. Husband will be transportation home.

## 2023-10-01 NOTE — Plan of Care (Signed)

## 2023-10-01 NOTE — Progress Notes (Signed)
PHARMACY - ANTICOAGULATION CONSULT NOTE  Pharmacy Consult for Warfarin  Indication: atrial fibrillation  Allergies  Allergen Reactions   Ace Inhibitors Cough and Other (See Comments)   Amoxicillin-Pot Clavulanate Swelling and Other (See Comments)    SWOLLEN JOINTS  Has patient had a PCN reaction causing immediate rash, facial/tongue/throat swelling, SOB or lightheadedness with hypotension: Yes Has patient had a PCN reaction causing severe rash involving mucus membranes or skin necrosis: No Has patient had a PCN reaction that required hospitalization No Has patient had a PCN reaction occurring within the last 10 years: No If all of the above answers are "NO", then may proceed with Cephalosporin   Penicillins Other (See Comments) and Swelling    SWOLLEN JOINTS   Has patient had a PCN reaction causing immediate rash, facial/tongue/throat swelling, SOB or lightheadedness with hypotension: Yes  Has patient had a PCN reaction causing severe rash involving mucus membranes or skin necrosis: No  Has patient had a PCN reaction that required hospitalization No  Has patient had a PCN reaction occurring within the last 10 years: No  If all of the above answers are "NO", then may proceed with Cephalosporin use.  Swollen joints  Swollen joints, Has patient had a PCN reaction causing immediate rash, facial/tongue/throat swelling, SOB or lightheadedness with hypotension: Yes, Has patient had a PCN reaction causing severe rash involving mucus membranes or skin necrosis: No, Has patient had a PCN reaction that required hospitalization No, Has patient had a PCN reaction occurring within the last 10 years: No, If all of the above answers are "NO", then may proceed with Cephalosporin use.   Rosuvastatin Other (See Comments)   Nifedipine Other (See Comments)    UNSPECIFIED   Propranolol Other (See Comments)    UNSPECIFIED   Diazepam Other (See Comments)    Made her "hyper"    Meperidine Other (See  Comments)    Made her "hyper"     Propoxyphene Other (See Comments)    Made her "hyper"     Patient Measurements: Height: 5\' 1"  (154.9 cm) Weight: 48.3 kg (106 lb 7.7 oz) IBW/kg (Calculated) : 47.8  Vital Signs: Temp: 98 F (36.7 C) (01/22 0342) Temp Source: Oral (01/22 0342) BP: 120/62 (01/22 0342) Pulse Rate: 106 (01/22 0342)  Labs: Recent Labs    09/29/23 0429 09/30/23 0548  HGB  --  10.7*  HCT  --  32.8*  PLT  --  286  LABPROT 24.6* 24.2*  INR 2.2* 2.1*  CREATININE 0.97  --    Estimated Creatinine Clearance: 31.4 mL/min (by C-G formula based on SCr of 0.97 mg/dL).  Medical History: Past Medical History:  Diagnosis Date   Acute on chronic respiratory failure with hypoxia (HCC) 02/04/2019   Added automatically from request for surgery [phone]   Acute on chronic systolic heart failure (HCC)    Anxiety    Arthritis    "hands" (11/10/2015)   Breast cancer (HCC) 2002   right   CAD s/p CABG x 1    a. 04/2015 [facility] to LAD   Chronic combined systolic (congestive) and diastolic (congestive) heart failure (HCC)    a. 01/2015 Echo: EF 50-55%, Gr2 DD (preop valve surgery); b. 08/2015 Echo: EF 30-35%, diff HK. Gr2 DD; c. 09/2016 Echo: EF 50-55%; d. 07/2018 Echo: EF 20-25%; e. 03/2019 Echo: EF 35-40%, diff HK. Mod red RV fxn, RVSP 34.24mmHg. Mildly dil LA. Nl MV prosthesis. Triv AI.   COPD (chronic obstructive pulmonary disease) (HCC)    Coronary  artery disease    GERD (gastroesophageal reflux disease)    History of colon polyps    History of mitral valve replacement    a. 04/2015 s/p 27 mm [facility] Mitral bovine bioprosthetic tissue valve   Hyperlipidemia    Hypertension    Hypothyroidism    Maze operation for AF w/ LAA clipping    a. 04/2015 Complete bilateral atrial lesion set using cryothermy and bipolar radiofrequency ablation with clipping of LA appendage   Meniere disease    Meniere's disease    On home oxygen therapy    "2L at night" (11/10/2015)   PAF (paroxysmal  atrial fibrillation) (HCC)    Personal history of radiation therapy    Pneumonia ~ 2010   PONV (postoperative nausea and vomiting)    Pt felt like eardrums were gonna pop   Post-surgical complete heart block, symptomatic    a. 04/2015 s/p MDT [id number] [employer]visa DR MRI DC PPM.   Presence of permanent cardiac pacemaker    Pulmonary hypertension (HCC)    Respiratory failure (HCC) 02/22/2019   Surgical wound, non healing - chest wall 11/10/2015   Tricuspid Regurgitation s/p Repair    a. 04/2015 s/p 26 mm Edwards mc3 ring annuloplasty   Wound infection 10/13/2015   Superficial sternal wound infection   Medications Patient is on warfarin 2.5 mg PO MWF and 1.25 mg PO TThSSu Last dose 1/15 in the PM  Assessment: NM is a 89 yo female who presented with worsening shortness of breath leading to increased oxygen requirements. They were admitted for COPD exacerbation and tested positive for Influenza A. They have a past medication history significant for atrial fibrillation treated with warfarin. Pharmacy consulted to dose warfarin.  DDIs: amiodarone (on PTA but was only taking 5 days/week; now ordered as daily), steroids  Goal of Therapy:  INR 2-3  INR Date INR Clinical Assessment Dose given  1/15 INR 2.8 Therapeutic HOLD  1/16 INR 3.2 SUPRAtherapuetic HOLD  1/17  INR 4.5 SUPRAtherapuetic HOLD  1/18 INR 2.6 Therapeutic 2.5 mg  1/19 INR 1.9 subtherapeutic 1 mg  1/20 INR 2.2 Therapeutic  1 mg  1/21 INR 2.1 Therapeutic 1.25 mg   1/22 INR 2.0 Therapeutic 2.5 mg (ordered)   Plan:  Warfarin is therapeutic today but down-trending, patient may require less warfarin than prior if amiodarone dose remains daily vs how she was taking it PTA (5 days/week), will continue to monitor Will order warfarin 2.5 mg po x 1   Monitor INR daily and CBC at least weekly  Merryl Hacker, PharmD 10/01/2023 7:21 AM

## 2023-10-01 NOTE — Discharge Summary (Signed)
Physician Discharge Summary   Patient: [name] MRN: [id number] DOB: 1935-07-18  Admit date:     09/24/2023  Discharge date: 10/01/2023  Discharge Physician: Pennie Banter   PCP: Reubin Milan, MD   Recommendations at discharge:    Follow up with Primary Care Follow up with Pulmonology Follow up with other outpatient providers as scheduled Repeat CBC, BMP, Mg at follow up   Discharge Diagnoses: Active Problems:   COPD with acute exacerbation (HCC)   Atrial fibrillation-persistent, with history of maze procedure   CAD s/p CABGx1   Anticoagulated on Coumadin   Elevated troponin   Chronic systolic heart failure (HCC)   Essential hypertension   Chronic anemia   Thrombocytopenia (HCC)   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 female with medical history significant for Stage 4 COPD on home O2 at 2 L, CAD, chronic A-fib on Coumadin, with history of maze procedure, systolic CHF, mitral and tricuspid valve repair, hypothyroidism, diabetes, anemia of chronic disease, who presents to the ED by EMS with worsening shortness of breath leading her to bump her oxygen up to 3 L.  No help with inhalers.  Also developed cough and congestion.  Husband has similar symptoms. "  Pt was admitted for COPD exacerbation with acute on chronic respiratory failure with hypoxia due to influenza A infection.     Further hospital course and management as outlined below.  1/22 -- pt feeling better and back on baseline oxygen.  She denies other acute complaints and is medically stable for discharge home today.     Assessment and Plan:  Influenza A --completed a course of  Tamiflu --supportive care   COPD exacerbation 2/2 flu --s/p Xopenex and atrovent nebs (to reduce tachycardia), since d/c'ed by RT --completed a course of steroid burst   Acute on chronic respiratory failure with hypoxia (HCC) On 2L O2 at baseline --Continue supplemental O2 to keep sats between 88-92%, wean to home 2L   Pulmonary Hypertension on prior echo --cont spironolactone  --IV lasix 40 x1 today   Elevated troponin Troponin elevated without complaints of chest pain, likely demand ischemia   CAD s/p CABGx1 --cont statin   Atrial fibrillation-persistent, with history of maze procedure Pacemaker present Chronic warfarin anticoagulation --having persistent sinus tachycardia, resolved after bisoprolol increased. --cont amiodarone --cont bisoprolol as 5 mg daily --cont warfrain per pharm   Chronic systolic heart failure (HCC) Cardiomyopathy- EF 30 to 35% 06/2023 Continue current new GDMT with spironolactone, Lasix and bisoprolol --hold Entresto due to soft BP   Physical deconditioning/frailty Secondary to multiple chronic cardiopulmonary conditions --initially rec for SNF rehab, but today found to improve enough to go home.   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 Graciela Husbands   Thrombocytopenia [facility] Mild worsening of chronic thrombocytopenia Mild thrombocytopenia of 109,000 (baseline 136-141,000).   Chronic anemia Hemoglobin at baseline   Hypokalemia --monitor and supplement PRN       Consultants: none Procedures performed: none  Disposition: Home health Diet recommendation:  Cardiac diet DISCHARGE MEDICATION: Allergies as of 10/01/2023       Reactions  Ace Inhibitors Cough, Other (See Comments)   Amoxicillin-pot Clavulanate Swelling, Other (See Comments)   SWOLLEN JOINTS  Has patient had a  PCN reaction causing immediate rash, facial/tongue/throat swelling, SOB or lightheadedness with hypotension: Yes Has patient had a PCN reaction causing severe rash involving mucus membranes or skin necrosis: No Has patient had a PCN reaction that required hospitalization No Has patient had a PCN reaction occurring within the last 10 years: No If all of the above answers are "NO", then may proceed with Cephalosporin   Penicillins Other (See Comments), Swelling   SWOLLEN JOINTS  Has patient had a PCN reaction causing immediate rash, facial/tongue/throat swelling, SOB or lightheadedness with hypotension: Yes Has patient had a PCN reaction causing severe rash involving mucus membranes or skin necrosis: No Has patient had a PCN reaction that required hospitalization No Has patient had a PCN reaction occurring within the last 10 years: No If all of the above answers are "NO", then may proceed with Cephalosporin use. Swollen joints Swollen joints, Has patient had a PCN reaction causing immediate rash, facial/tongue/throat swelling, SOB or lightheadedness with hypotension: Yes, Has patient had a PCN reaction causing severe rash involving mucus membranes or skin necrosis: No, Has patient had a PCN reaction that required hospitalization No, Has patient had a PCN reaction occurring within the last 10 years: No, If all of the above answers are "NO", then may proceed with Cephalosporin use.   Rosuvastatin Other (See Comments)   Nifedipine Other (See Comments)   UNSPECIFIED   Propranolol Other (See Comments)   UNSPECIFIED   Diazepam Other (See Comments)   Made her "hyper"   Meperidine Other (See Comments)   Made her "hyper"   Propoxyphene Other (See Comments)   Made her "hyper"        Medication List     PAUSE taking these medications    Entresto 49-51 MG Wait to take this until your doctor or other care provider tells you to start again. Due to blood pressure on the lower side. Generic drug:  sacubitril-valsartan Take 1 tablet (49/51 mg) by mouth twice daily       TAKE these medications    acetaminophen 500 MG tablet Commonly known as: TYLENOL Take 1,000 mg by mouth every 6 (six) hours as needed (for pain.).   amiodarone 200 MG tablet Commonly known as: PACERONE Take 1 tablet (200 mg) 5 days per week   bisoprolol 5 MG tablet Commonly known as: ZEBETA TAKE 1/2 TABLET BY MOUTH DAILY   diphenhydrAMINE 25 mg capsule Commonly known as: BENADRYL Take 25 mg by mouth every 6 (six) hours as needed for allergies.   docusate sodium 100 MG capsule Commonly known as: COLACE Take 100 mg by mouth 2 (two) times daily as needed (constipation.).   feeding supplement Liqd Take 237 mLs by mouth 2 (two) times daily between meals.   furosemide 20 MG tablet Commonly known as: LASIX TAKE 2 TABLETS (40 MG) IN THE MORNING.   gabapentin 100 MG capsule Commonly known as: NEURONTIN Take 2 capsules (200 mg total) by mouth at bedtime.   guaiFENesin-dextromethorphan 100-10 MG/5ML syrup Commonly known as: ROBITUSSIN DM Take 10 mLs by mouth every 6 (six) hours as needed for cough.   ipratropium-albuterol 0.5-2.5 (3) MG/3ML Soln Commonly known as: DUONEB Inhale 3 mLs into the lungs every 6 (six) hours as needed.   IRON PO Take 1 tablet by mouth daily.   levothyroxine 88 MCG tablet Commonly known as: SYNTHROID TAKE 1 TABLET  BY MOUTH EVERY DAY BEFORE BREAKFAST   OXYGEN Inhale 2 L into the lungs continuous.   pantoprazole 40 MG tablet Commonly known as: PROTONIX TAKE 1 TABLET BY MOUTH TWICE A DAY   PRESERVISION AREDS 2 PO Take 1 tablet by mouth 2 (two) times daily.   simvastatin 10 MG tablet Commonly known as: ZOCOR TAKE 1 TABLET BY MOUTH EVERYDAY AT BEDTIME   spironolactone 25 MG tablet Commonly known as: ALDACTONE TAKE 1 TABLET BY MOUTH EVERY DAY   VITAMIN B-12 PO Take 1,000 mcg by mouth daily.   warfarin 2.5 MG tablet Commonly known as: COUMADIN Take as directed.  If you are unsure how to take this medication, talk to your nurse or doctor. Original instructions: TAKE 1/2 A TABLET TO 1 TABLET BY MOUTH DAILY AS DIRECTED BY THE COUMADIN CLINIC.        Discharge Exam: Filed Weights   09/24/23 1919  Weight: 48.3 kg   General exam: awake, alert, no acute distress HEENT: atraumatic, clear conjunctiva, anicteric sclera, moist mucus membranes, hearing grossly normal  Respiratory system: CTAB mildly diminished throughout, no wheezes, rales or rhonchi, normal respiratory effort. Cardiovascular system: normal S1/S2, RRR, no pedal edema.   Gastrointestinal system: soft, NT, ND Central nervous system: A&O x 3. no gross focal neurologic deficits, normal speech Skin: dry, intact, normal temperature Psychiatry: normal mood, congruent affect, judgement and insight appear normal   Condition at discharge: stable  The results of significant diagnostics from this hospitalization (including imaging, microbiology, ancillary and laboratory) are listed below for reference.   Imaging Studies: DG Chest 1 View Result Date: 09/24/2023 CLINICAL DATA:  10026 Shortness of breath 10026 EXAM: CHEST  1 VIEW COMPARISON:  Chest x-ray 07/17/2018. FINDINGS: Left chest wall dual lead pacemaker. Atrial appendage clip. Left mitral and tricuspid valve replacement. The heart and mediastinal contours are unchanged. Atherosclerotic plaque. Biapical pleural/pulmonary scarring. Chronic pericentimeter calcified right upper lobe subpleural nodule. Biapical pleural/pulmonary scarring. No focal consolidation. Chronic coarsened interstitial markings with no overt pulmonary edema. No pleural effusion. No pneumothorax. No acute osseous abnormality.  Sternotomy wires are intact. IMPRESSION: 1. No active disease. 2. Aortic Atherosclerosis (ICD10-I70.0) and Emphysema (ICD10-J43.9). Electronically Signed   By: Tish Frederickson M.D.   On: 09/24/2023 20:12    Microbiology: Results for orders placed or  performed during the hospital encounter of 09/24/23  Resp panel by RT-PCR (RSV, Flu A&B, Covid) Anterior Nasal Swab     Status: Abnormal   Collection Time: 09/24/23 11:26 PM   Specimen: Anterior Nasal Swab  Result Value Ref Range Status   SARS Coronavirus 2 by RT PCR NEGATIVE NEGATIVE Final    Comment: (NOTE) SARS-CoV-2 target nucleic acids are NOT DETECTED.  The SARS-CoV-2 RNA is generally detectable in upper respiratory specimens during the acute phase of infection. The lowest concentration of SARS-CoV-2 viral copies this assay can detect is 138 copies/mL. A negative result does not preclude SARS-Cov-2 infection and should not be used as the sole basis for treatment or other patient management decisions. A negative result may occur with  improper specimen collection/handling, submission of specimen other than nasopharyngeal swab, presence of viral mutation(s) within the areas targeted by this assay, and inadequate number of viral copies(<138 copies/mL). A negative result must be combined with clinical observations, patient history, and epidemiological information. The expected result is Negative.  Fact Sheet for Patients:  BloggerCourse.com  Fact Sheet for Healthcare Providers:  SeriousBroker.it  This test is no t yet approved or cleared by the [place]  States FDA and  has been authorized for detection and/or diagnosis of SARS-CoV-2 by FDA under an Emergency Use Authorization (EUA). This EUA will remain  in effect (meaning this test can be used) for the duration of the COVID-19 declaration under Section 564(b)(1) of the Act, 21 U.S.C.section 360bbb-3(b)(1), unless the authorization is terminated  or revoked sooner.       Influenza A by PCR POSITIVE (A) NEGATIVE Final   Influenza B by PCR NEGATIVE NEGATIVE Final    Comment: (NOTE) The Xpert Xpress SARS-CoV-2/FLU/RSV plus assay is intended as an aid in the diagnosis of influenza  from Nasopharyngeal swab specimens and should not be used as a sole basis for treatment. Nasal washings and aspirates are unacceptable for Xpert Xpress SARS-CoV-2/FLU/RSV testing.  Fact Sheet for Patients: BloggerCourse.com  Fact Sheet for Healthcare Providers: SeriousBroker.it  This test is not yet approved or cleared by the [place] FDA and has been authorized for detection and/or diagnosis of SARS-CoV-2 by FDA under an Emergency Use Authorization (EUA). This EUA will remain in effect (meaning this test can be used) for the duration of the COVID-19 declaration under Section 564(b)(1) of the Act, 21 U.S.C. section 360bbb-3(b)(1), unless the authorization is terminated or revoked.     Resp Syncytial Virus by PCR NEGATIVE NEGATIVE Final    Comment: (NOTE) Fact Sheet for Patients: BloggerCourse.com  Fact Sheet for Healthcare Providers: SeriousBroker.it  This test is not yet approved or cleared by the [place] FDA and has been authorized for detection and/or diagnosis of SARS-CoV-2 by FDA under an Emergency Use Authorization (EUA). This EUA will remain in effect (meaning this test can be used) for the duration of the COVID-19 declaration under Section 564(b)(1) of the Act, 21 U.S.C. section 360bbb-3(b)(1), unless the authorization is terminated or revoked.  Performed at [facility], [address] [place], [place] [address]     Labs: CBC: Recent Labs  Lab 09/30/23 0548  WBC 10.0  HGB 10.7*  HCT 32.8*  MCV 101.9*  PLT 286   Basic Metabolic Panel: Recent Labs  Lab 09/29/23 0429  NA 142  K 4.9  CL 93*  CO2 39*  GLUCOSE 106*  BUN 47*  CREATININE 0.97  CALCIUM 8.8*  MG 2.4   Liver Function Tests: No results for input(s): "AST", "ALT", "ALKPHOS", "BILITOT", "PROT", "ALBUMIN" in the last 168 hours. CBG: No results for input(s): "GLUCAP" in  the last 168 hours.  Discharge time spent: less than 30 minutes.  Signed: Pennie Banter, DO [facility] 10/04/2023

## 2023-10-01 NOTE — TOC Progression Note (Signed)
Transition of Care [facility]) - Progression Note    Patient Details  Name: [name] MRN: [id number] Date of Birth: 09/07/1935  [facility] CM/SW Contact  Chapman Fitch, RN Phone Number: 10/01/2023, 7:46 AM  Clinical Narrative:     Therapy has upgrade recs to home health Met with patient at bed side.  Her brother was there visiting.  She is in agreement to return home and would like home health services.  She states that she does not have a preference of agency. CMS Medicare.gov Compare Post Acute Care list reviewed with patient.  Referral made to Artatvia with [facility] as patient states she has had them in the past and was happy with their service.  Per Husband Stanley,  patients portable O2 tank will not accommodate 4 liters for transport home.  Per Archie Patten at the [place] office larger portable tank to be delivered to room today     Barriers to Discharge: Continued Medical Work up  Expected Discharge Plan and Services       Living arrangements for the past 2 months: Single Family Home                                       Social Determinants of Health (SDOH) Interventions SDOH Screenings   Food Insecurity: No Food Insecurity (09/26/2023)  Housing: Patient Declined (09/26/2023)  Transportation Needs: Patient Declined (09/26/2023)  Utilities: Not At Risk (09/26/2023)  Alcohol Screen: Low Risk  (10/01/2021)  Depression (PHQ2-9): Low Risk  (04/09/2023)  Financial Resource Strain: Low Risk  (11/15/2022)  Physical Activity: Inactive (10/04/2022)  Social Connections: Moderately Isolated (09/26/2023)  Stress: No Stress Concern Present (10/04/2022)  Tobacco Use: Medium Risk (09/24/2023)    Readmission Risk Interventions     No data to display

## 2023-10-01 NOTE — TOC Progression Note (Signed)
Transition of Care [facility]) - Progression Note    Patient Details  Name: AYAHNA KARY MRN: [id number] Date of Birth: 1935/06/04  [facility]) CM/SW Contact  Erin Sons, [place] Phone Number: 10/01/2023, 12:55 PM  Clinical Narrative:     DC orders have been placed. HH and O2 already arranged. CSW notified [facility] of DC. HH orders are in place.   Expected Discharge Plan: Skilled Nursing Facility Barriers to Discharge: No barriers  Expected Discharge Plan and Services       Living arrangements for the past 2 months: Single Family Home Expected Discharge Date: 10/01/23                                     Social Determinants of Health (SDOH) Interventions SDOH Screenings   Food Insecurity: No Food Insecurity (09/26/2023)  Housing: Patient Declined (09/26/2023)  Transportation Needs: Patient Declined (09/26/2023)  Utilities: Not At Risk (09/26/2023)  Alcohol Screen: Low Risk  (10/01/2021)  Depression (PHQ2-9): Low Risk  (04/09/2023)  Financial Resource Strain: Low Risk  (11/15/2022)  Physical Activity: Inactive (10/04/2022)  Social Connections: Moderately Isolated (09/26/2023)  Stress: No Stress Concern Present (10/04/2022)  Tobacco Use: Medium Risk (09/24/2023)    Readmission Risk Interventions     No data to display

## 2023-10-02 ENCOUNTER — Telehealth: Payer: Self-pay

## 2023-10-02 NOTE — Transitions of Care (Post Inpatient/ED Visit) (Signed)
10/02/2023  Name: [name] MRN: [id number] DOB: 1935-07-11  Today's TOC FU Call Status: Today's TOC FU Call Status:: Successful TOC FU Call Completed TOC FU Call Complete Date: 10/02/23 Patient's Name and Date of Birth confirmed.  Transition Care Management Follow-up Telephone Call Date of Discharge: 10/01/23 Discharge Facility: [facility]) Type of Discharge: Inpatient Admission Primary Inpatient Discharge Diagnosis:: Influenza A How have you been since you were released from the hospital?: Better Any questions or concerns?: Yes Patient Questions/Concerns:: Medications and Home Health Patient Questions/Concerns Addressed: Notified Provider of Patient Questions/Concerns (Call placed to [facility] [phone] Requested Nursing be added to [facility] orders ( current were only PT/OT))  Items Reviewed: Did you receive and understand the discharge instructions provided?: Yes Medications obtained,verified, and reconciled?: Yes (Medications Reviewed) Any new allergies since your discharge?: No Dietary orders reviewed?: Yes Type of Diet Ordered:: Reg Heart Healthy , uses Ensure to siupplement Do you have support at home?: Yes People in Home: spouse Name of Support/Comfort Primary Source: Husband Duffy Rhody  Medications Reviewed Today: Medications Reviewed Today     Reviewed by Johnnette Barrios, RN (Registered Nurse) on 10/02/23 at 1016  Med List Status: <None>   Medication Order Taking? Sig Documenting Provider Last Dose Status Informant  acetaminophen (TYLENOL) 500 MG tablet [id number] Yes Take 1,000 mg by mouth every 6 (six) hours as needed (for pain.). [provider] Taking Active Spouse/Significant Other, Self  amiodarone (PACERONE) 200 MG tablet [id number] Yes Take 1 tablet (200 mg) 5 days per week Duke Salvia, MD Taking Active Self  bisoprolol (ZEBETA) 5 MG tablet [id number] No TAKE 1/2 TABLET BY MOUTH DAILY  Patient not taking: Reported on  09/25/2023   Duke Salvia, MD Not Taking Active Self  Cyanocobalamin (VITAMIN B-12 PO) [id number] Yes Take 1,000 mcg by mouth daily. [provider] Taking Active Self  diphenhydrAMINE (BENADRYL) 25 mg capsule [id number] Yes Take 25 mg by mouth every 6 (six) hours as needed for allergies. [provider] Taking Active Spouse/Significant Other, Self  docusate sodium (COLACE) 100 MG capsule [id number] Yes Take 100 mg by mouth 2 (two) times daily as needed (constipation.). [provider] Taking Active Spouse/Significant Other, Self  feeding supplement (ENSURE ENLIVE / ENSURE PLUS) LIQD [id number] Yes Take 237 mLs by mouth 2 (two) times daily between meals. Pennie Banter, DO Taking Active   Ferrous Sulfate (IRON PO) [id number] Yes Take 1 tablet by mouth daily. [provider] Taking Active Self  furosemide (LASIX) 20 MG tablet [id number] Yes TAKE 2 TABLETS (40 MG) IN THE MORNING. Duke Salvia, MD Taking Active Self  gabapentin (NEURONTIN) 100 MG capsule [id number] Yes Take 2 capsules (200 mg total) by mouth at bedtime. Reubin Milan, MD Taking Active Self  guaiFENesin-dextromethorphan [facility] DM) 100-10 MG/5ML syrup [id number] Yes Take 10 mLs by mouth every 6 (six) hours as needed for cough. Esaw Grandchild A, DO Taking Active   ipratropium-albuterol (DUONEB) 0.5-2.5 (3) MG/3ML SOLN [id number] Yes Inhale 3 mLs into the lungs every 6 (six) hours as needed. Erin Fulling, MD Taking Active Self  levothyroxine (SYNTHROID) 88 MCG tablet [id number] Yes TAKE 1 TABLET BY MOUTH EVERY DAY BEFORE BREAKFAST Reubin Milan, MD Taking Active Self  Multiple Vitamins-Minerals (PRESERVISION AREDS 2 PO) [id number] Yes Take 1 tablet by mouth 2 (two) times daily.  [provider] Taking Active Spouse/Significant Other, Self  OXYGEN [id number] Yes Inhale 2 L into the lungs continuous. [provider] Taking Active Spouse/Significant  Other, Self           Med Note  (Alencia Gordon L   Thu Oct 02, 2023 10:15 AM) Currently on 4 l/m  pantoprazole (PROTONIX) 40 MG tablet [id number] Yes TAKE 1 TABLET BY MOUTH TWICE A DAY Reubin Milan, MD Taking Active Self  sacubitril-valsartan (ENTRESTO) 49-51 MG [id number] No Take 1 tablet (49/51 mg) by mouth twice daily  Patient not taking: Reported on 10/02/2023   Duke Salvia, MD Not Taking Active Self           Med Note Sharon Seller, [place] Gasner L   Thu Oct 02, 2023 10:16 AM) On hold until seen by PCP/Cardiologist to resume   simvastatin (ZOCOR) 10 MG tablet [id number] Yes TAKE 1 TABLET BY MOUTH EVERYDAY AT BEDTIME Iran Ouch, MD Taking Active Self  spironolactone (ALDACTONE) 25 MG tablet [id number] Yes TAKE 1 TABLET BY MOUTH EVERY DAY Iran Ouch, MD Taking Active Self  warfarin (COUMADIN) 2.5 MG tablet [id number] Yes TAKE 1/2 A TABLET TO 1 TABLET BY MOUTH DAILY AS DIRECTED BY THE COUMADIN CLINIC. Iran Ouch, MD Taking Active Self           Med Note Sharon Seller, [place] Dorado L   Thu Oct 02, 2023 10:16 AM) Verified alternating doses with spouse           Medication reconciliation / review completed based on most recent discharge summary and EHR medication list. Confirmed patient is taking all newly prescribed medications as instructed (any discrepancies are noted in review section)   Patient / Caregiver is aware of any changes to and / or  any dosage adjustments to medication regimen. Patient/ Caregiver denies questions at this time and reports no barriers to medication adherence.   Home Care and Equipment/Supplies: Were Home Health Services Ordered?: Yes Name of Home Health Agency:: [facility] [phone] Called and requested Nursinh be added for med and disease management Has Agency set up a time to come to your home?: No EMR reviewed for Home Health Orders: Orders present/patient has not received call (refer to CM for follow-up) (spoke with Ci dy pkan is for call today and 1st visit 1/24) Any new equipment or  medical supplies ordered?: Yes Name of Medical supply agency?: [employer] [phone] o2 conc portable tanks , Neb supplies Were you able to get the equipment/medical supplies?: Yes Do you have any questions related to the use of the equipment/supplies?: No  Functional Questionnaire: Do you need assistance with bathing/showering or dressing?: Yes Do you need assistance with meal preparation?: Yes Do you need assistance with eating?: No Do you have difficulty maintaining continence: No Do you need assistance with getting out of bed/getting out of a chair/moving?: No Do you have difficulty managing or taking your medications?: Yes (sppuse will begin assisting with managing, req HH for med management as well)  Follow up appointments reviewed: PCP Follow-up appointment confirmed?: No (Spouse states he will schedue) MD Provider Line [phone] Given: No Specialist Hospital Follow-up appointment confirmed?: Yes Date of Specialist follow-up appointment?: 11/06/23 Follow-Up Specialty Provider:: Pulmonology 2/27 AWV 2/5 Cardilogist 3/13 - has Pacemaker Do you need transportation to your follow-up appointment?: No (spouse drives) Do you understand care options if your condition(s) worsen?: Yes-patient verbalized understanding  SDOH Interventions Today    Flowsheet Row Most Recent Value  SDOH Interventions   Food Insecurity Interventions Intervention Not Indicated  Housing Interventions Intervention Not Indicated  Transportation Interventions Intervention Not Indicated, Patient Resources (Friends/Family)  Utilities Interventions Intervention Not Indicated  Social Connections Interventions Intervention Not Indicated     Patient is at high risk for readmission and/or has history of  high utilization  Discussed VBCI  TOC program and weekly calls to patient to assess condition/status, medication management  and provide support/education as indicated . Patient/ Caregiver voiced understanding  and is  agreeable to 30 day program    Routine follow-up and on-going assessment evaluation and education of disease processes, recommended interventions for both chronic and acute medical conditions , will occur during each weekly visit along with ongoing review of symptoms ,medication reviews and reconciliation. Any updates , inconsistencies, discrepancies or acute care concerns will be addressed and routed to the correct Practitioner if indicated   Based on current information and Insurance plan -Reviewed benefits available to patient, including details about eligibility options for care if any area of needs were identified.  Reviewed patients ability to access and / or navigating the benefits system..Amb Referral made if indicted , refer to orders section of note for details   Please refer to Care Plan for goals and interventions -Effectiveness of interventions, symptom management and outcomes will be evaluated  weekly during [facility] 30-day Program Outreach calls  . Any necessary  changes and updates to Care Plan will be completed episodically    Reviewed goals for care Patient verbalizes understanding of instructions and care plan provided. Patient was encouraged to make informed decisions about their care, actively participate in managing their health condition, and implement lifestyle changes as needed to promote independence and self-management of health care   The patient has been provided with contact information for the care management team and has been advised to call with any health-related questions or concerns. Follow up as indicated with Care Team , or sooner should any new problems arise.  Home Health visit anticipated 10/03/23   Susa Loffler , BSN, RN [place]   VBCI-Population Health RN Care Manager Direct Dial [phone] Fax  Website: [email or web address]

## 2023-10-02 NOTE — Patient Instructions (Signed)
Visit Information  Thank you for taking time to visit with me today. Please don't hesitate to contact me if I can be of assistance to you before our next scheduled telephone appointment.  Our next appointment is by telephone on 10/07/23  at 2:00pm  Following is a copy of your care plan:   Goals Addressed             This Visit's Progress    TOC Care Plan       Current Barriers:  Medication management polypharmacy Diet/Nutrition/Food Resources uses Supplements ( Ensure) Provider appointments PCP, Cardiology , Pulmonology Home Health services [facility]/ PT/OT Equipment/DME )2, concentrator, portable tanks, Nebulizer machine Functional/Safety fall risk, risk of bleeding Cognitive Deficits  RNCM Clinical Goal(s):  Patient will work with the Care Management team over the next 30 days to address Transition of Care Barriers: Medication Management Support at home Provider appointments Home Health services Equipment/DME take all medications exactly as prescribed and will call provider for medication related questions as evidenced by no missed medication doses attend all scheduled medical appointments: with PCP, Cardiology and Pulmonology and Home Health as evidenced by no missed follow-up visits  through collaboration with RN Care manager, provider, and care team.   Interventions: Evaluation of current treatment plan related to  self management and patient's adherence to plan as established by provider  Transitions of Care:  New goal. Doctor Visits  - discussed the importance of doctor visits Contacted Health RN/OT/PT - [facility] [phone] Post discharge activity limitations prescribed by provider reviewed Reviewed Signs and symptoms of infection  AFIB Interventions: (Status:  New goal.) Short Term Goal   Reviewed importance of adherence to anticoagulant exactly as prescribed Counseled on bleeding risk associated with Coumadin and importance of self-monitoring for  signs/symptoms of bleeding Counseled on importance of regular laboratory monitoring as prescribed Counseled on seeking medical attention after a head injury or if there is blood in the urine/stool Screening for signs and symptoms of depression related to chronic disease state  Assessed social determinant of health barriers   COPD Interventions:  (Status:  New goal.) Short Term Goal Provided patient with basic written and verbal COPD education on self care/management/and exacerbation prevention Provided instruction about proper use of medications used for management of COPD including inhalers Provided education about and advised patient to utilize infection prevention strategies to reduce risk of respiratory infection Discussed the importance of adequate rest and management of fatigue with COPD Assessed social determinant of health barriers  Patient Goals/Self-Care Activities: Participate in Transition of Care Program/Attend TOC scheduled calls Take all medications as prescribed Attend all scheduled provider appointments Call pharmacy for medication refills 3-7 days in advance of running out of medications Perform all self care activities independently  Perform IADL's (shopping, preparing meals, housekeeping, managing finances) independently Call provider office for new concerns or questions   Follow Up Plan:  Telephone follow up appointment with care management team member scheduled for:  10/07/23 @ 2:00pm The patient has been provided with contact information for the care management team and has been advised to call with any health related questions or concerns.           Reviewed goals for care Patient/ Caregiver verbalizes understanding of instructions with the plan of care . The  Patient / Caregiver was encouraged to make informed decisions about care, actively participate in managing health conditions, and implement lifestyle changes as needed to promote independence and  self-management of healthcare. SDOH screenings have been completed and  addressed if indicted There are no reported barriers to care.    Follow-up Plan VBCI Case Management Nurse will provide follow-up and on-going assessment ,evaluation and education of disease processes, recommended interventions for both chronic and acute medical conditions ,  along with ongoing review of symptoms ,medication reviews / reconciliation during each weekly call . Any updates , inconsistencies, discrepancies or acute care concerns will be addressed and routed to the correct Practitioner if indicated   [facility]  Please call the care guide team at [phone]  if you need to cancel or reschedule your appointment . For scheduled calls -Three attempts will be made to reach you -if the scheduled call is missed or  we are unable to reach the you after 3 attempts no additional outreach attempts will be made and the TOC follow-up will be closed .   If you need to speak to a Nurse you may  call me directly at the number below or if I am unavailable,and  your need is urgent  please call the main VBCI number at [phone] and ask to speak with one of the [facility] ( Transition of Care )  Nurses  .                                                                               Additionally, If you experience worsening of your symptoms, develop shortness of breath, If you are experiencing a medical emergency,  develop suicidal or homicidal thoughts you must seek medical attention immediately by calling 911 or report to your local emergency department or urgent care.   If you have a non-emergency medical problem during routine business hours, please contact your provider's office and ask to speak with a nurse.       Please take the time to read instructions/literature along with the possible adverse reactions/side effects for all the Medicines that have been prescribed to you. Only take newly prescribed  Medications  after you have completely understood and accept all the possible adverse reactions/side effects.   Do not take more than prescribed Medications for  Pain, Sleep and Anxiety. Do not drive when taking Pain medications or sleep aid/ insomnia  medications It is not advisable to combine anxiety, sleep and pain medications without talking with your primary care practitioner    If you are experiencing a Mental Health or Behavioral Health Crisis or need someone to talk to Please call the Suicide and Crisis Lifeline: 988 You may also call the [place] National Suicide Prevention Lifeline: 609-816-7905 or TTY: 262-566-2274 TTY 501-639-7024) to talk to a trained counselor.  You may call the Behavioral Health Crisis Line at 5095169372, at any time, 24 hours a day, 7 days a week- however If you are in danger or need immediate medical attention, call 911.   If you would like help to quit smoking, call 1-800-QUIT-NOW ( (310)202-9695) OR Espaol: 1-855-Djelo-Ya (7-564-332-9518) o para ms informacin haga clic aqu or Text READY to 841-660 to register via text.   Susa Loffler , BSN, RN [place]   VBCI-Population Health RN Care Manager Direct Dial [phone] Fax  Website: [email or web address]

## 2023-10-04 ENCOUNTER — Encounter: Payer: Self-pay | Admitting: Internal Medicine

## 2023-10-06 ENCOUNTER — Telehealth: Payer: Self-pay | Admitting: Internal Medicine

## 2023-10-06 ENCOUNTER — Telehealth: Payer: Self-pay | Admitting: *Deleted

## 2023-10-06 MED ORDER — IPRATROPIUM-ALBUTEROL 0.5-2.5 (3) MG/3ML IN SOLN: 3.0000 mL | Freq: Four times a day (QID) | RESPIRATORY_TRACT | 2 refills | Status: AC | PRN

## 2023-10-06 NOTE — Telephone Encounter (Signed)
Pt states throat is swollen and is asking for a refill on her neb solution to be sent to [facility] in [place]

## 2023-10-06 NOTE — Telephone Encounter (Signed)
I have sent in the medication and notified the patient.  Nothing further needed.

## 2023-10-06 NOTE — Telephone Encounter (Signed)
Called and spoke with [facility] pharmacy staff regarding the need for alternative to Ipratropium-Albuterol nebulizer solution as it is not covered.  I asked if they ran it through Medicare part B.  It is covered under Medicare part B.  Nothing further needed.

## 2023-10-06 NOTE — Telephone Encounter (Signed)
I spoke with the patient. She wants a refill on her DuoNeb.  She tested positive for the flu on 1/15 and was hospitalized for 4 days. She still has a lot of congestion and a cough. She can not get the sputum up far enough to see what color it is. She said the Duoneb normally opens her up. She has some at home but it is 87 years old. Not on any antibiotics at this time.  No fever, chills or sweats No increased SOB She is not on any inhalers   [facility] [place]   Dr. Belia Heman is out of the office. Dr. Jayme Cloud please advise.

## 2023-10-06 NOTE — Telephone Encounter (Signed)
Approve the refill for DuoNeb for the patient.  May give 2 refills.

## 2023-10-07 ENCOUNTER — Telehealth: Payer: Self-pay | Admitting: Cardiovascular Disease

## 2023-10-07 ENCOUNTER — Telehealth: Payer: Self-pay | Admitting: Internal Medicine

## 2023-10-07 ENCOUNTER — Other Ambulatory Visit: Payer: Self-pay

## 2023-10-07 NOTE — Telephone Encounter (Signed)
Pt c/o medication issue:  1. Name of Medication:   sacubitril-valsartan (ENTRESTO) 49-51 MG (Paused)   2. How are you currently taking this medication (dosage and times per day)?   3. Are you having a reaction (difficulty breathing--STAT)?   4. What is your medication issue?   Patient stated she was hospitalized for flu and was taken off this medication as her blood pressure was trending too low.  Patient wants advice on next steps.

## 2023-10-07 NOTE — Telephone Encounter (Addendum)
The patient called and stated that during her recent admission, her [name] was stopped due to low blood pressure. She mentioned that her home health nurse checked her blood pressure yesterday, and it was 90/65. The patient wanted to make PA aware and seek guidance on further recommendations.

## 2023-10-07 NOTE — Patient Outreach (Signed)
  Care Management  Transitions of Care Program Transitions of Care Post-discharge week 2  10/07/2023 Name: [name] MRN: [id number] DOB: [birth year]  Subjective: [name] is a 89 y.o. year old female who is a primary care patient of Reubin Milan, MD. The Care Management team was unable to reach the patient by phone to assess and address transitions of care needs. Patient was called in an Outreach attempt to offer VBCI  30-day TOC program. Pt is eligible for program due to potential risk for readmission and/or high utilization. Unfortunately, I was not able to speak with the patient in regards to recent hospital discharge Unable to leave a message Patients voice mail is not set up  . Noted on chart review for 1/28 [facility] called with following concerns  Home Health Verbal Orders - Caller/Agency: [facility] Service Requested: PT eval and PT Frequency:  2x4 1x4  Any new concerns about the patient? Yes/ med reactions 2 with warfarin and one with gabapetin  Additionally    The patient called and stated that during her recent admission, her Sherryll Burger was stopped due to low blood pressure. She mentioned that her home health nurse checked her blood pressure yesterday, and it was 90/65. The patient wanted to make PA aware and seek guidance on further recommendations.        Plan: Additional outreach attempts will be made to reach the patient enrolled in the [facility] Program (Post Inpatient/ED Visit).  Susa Loffler , BSN, RN [facility]   VBCI-Population Health RN Care Manager Direct Dial [phone]  Fax: [phone] Website: [email or web address]

## 2023-10-07 NOTE — Telephone Encounter (Signed)
Home Health Verbal Orders - Caller/Agency: Gerri Spore from [facility] Callback Number: [phone] Service Requested: PT eval and PT Frequency:  2x4 1x4  Any new concerns about the patient? Yes/ med reactions 2 with warfarin and one with gabapetin

## 2023-10-07 NOTE — Telephone Encounter (Signed)
Called  left VM giving a verbal. Name was stated on VM.  KP

## 2023-10-07 NOTE — Telephone Encounter (Signed)
Attempted to contact pt. Line ringing busy.   Furth, Cadence H, PA-C  You4 hours ago (10:14 AM)    Is the patient symptomatic? we can stop the bisoprolol as well. We can also bring her in to discuss BP and medications

## 2023-10-08 ENCOUNTER — Telehealth: Payer: Self-pay

## 2023-10-08 ENCOUNTER — Encounter: Payer: Self-pay | Admitting: Internal Medicine

## 2023-10-08 NOTE — Patient Outreach (Signed)
Care Management  Transitions of Care Program Transitions of Care Post-discharge week 2   10/08/2023 Name: [name] MRN: [id number] DOB: [birth year]  Subjective: [name] is a 89 y.o. year old female who is a primary care patient of Reubin Milan, MD. The Care Management team Engaged with patient Engaged with patient by telephone to assess and address transitions of care needs.   Consent to Services:  Patient was given information about care management services, agreed to services, and gave verbal consent to participate.   Assessment:           SDOH Interventions    Flowsheet Row Telephone from 10/02/2023 in [place] POPULATION HEALTH DEPARTMENT Clinical Support from 10/04/2022 in [facility] Primary Care & Sports Medicine at [facility] Office Visit from 04/01/2022 in [facility] Primary Care & Sports Medicine at [facility] Office Visit from 02/19/2022 in [facility] Primary Care & Sports Medicine at [facility] Clinical Support from 10/01/2021 in [facility] Primary Care & Sports Medicine at [facility]  SDOH Interventions       Food Insecurity Interventions Intervention Not Indicated Intervention Not Indicated -- -- Intervention Not Indicated  Housing Interventions Intervention Not Indicated Intervention Not Indicated -- -- Intervention Not Indicated  Transportation Interventions Intervention Not Indicated, Patient Resources (Friends/Family) Intervention Not Indicated -- -- Intervention Not Indicated  Utilities Interventions Intervention Not Indicated Intervention Not Indicated -- -- --  Depression Interventions/Treatment  -- -- Counseling PHQ2-9 Score <4 Follow-up Not Indicated --  Financial Strain Interventions -- -- -- -- Intervention Not Indicated  Physical Activity Interventions -- Intervention Not Indicated -- -- Intervention Not Indicated  Stress Interventions -- Intervention Not Indicated -- -- Intervention Not Indicated  Social Connections  Interventions Intervention Not Indicated Intervention Not Indicated -- -- Intervention Not Indicated        Goals Addressed             This Visit's Progress    COMPLETED: TOC Care Plan       Current Barriers:  Medication management polypharmacy Diet/Nutrition/Food Resources uses Supplements ( Ensure) Provider appointments PCP, Cardiology , Pulmonology Home Health services [facility]/ PT/OT Equipment/DME )2, concentrator, portable tanks, Nebulizer machine Functional/Safety fall risk, risk of bleeding Cognitive Deficits  RNCM Clinical Goal(s):  Patient will work with the Care Management team over the next 30 days to address Transition of Care Barriers: Medication Management Support at home Provider appointments Home Health services Equipment/DME take all medications exactly as prescribed and will call provider for medication related questions as evidenced by no missed medication doses attend all scheduled medical appointments: with PCP, Cardiology and Pulmonology and Home Health as evidenced by no missed follow-up visits  through collaboration with RN Care manager, provider, and care team.   Interventions: Evaluation of current treatment plan related to  self management and patient's adherence to plan as established by provider  Transitions of Care:  Gaol Not Met. and patient was admitted to [facility] 10/08/23 Doctor Visits  - discussed the importance of doctor visits Contacted Health RN/OT/PT - [facility] [phone] Post discharge activity limitations prescribed by provider reviewed Reviewed Signs and symptoms of infection  AFIB Interventions: (Status:  Goal Met. and Patient admitted to SNF  ) Short Term Goal   Reviewed importance of adherence to anticoagulant exactly as prescribed Counseled on bleeding risk associated with Coumadin and importance of self-monitoring for signs/symptoms of bleeding Counseled on importance of regular laboratory monitoring as  prescribed Counseled on seeking medical attention  after a head injury or if there is blood in the urine/stool Screening for signs and symptoms of depression related to chronic disease state  Assessed social determinant of health barriers   COPD Interventions:  (Status:  Goal Not Met.) Short Term Goal Provided patient with basic written and verbal COPD education on self care/management/and exacerbation prevention Provided instruction about proper use of medications used for management of COPD including inhalers Provided education about and advised patient to utilize infection prevention strategies to reduce risk of respiratory infection Discussed the importance of adequate rest and management of fatigue with COPD Assessed social determinant of health barriers  Patient Goals/Self-Care Activities: Participate in Transition of Care Program/Attend TOC scheduled calls Take all medications as prescribed Attend all scheduled provider appointments Call pharmacy for medication refills 3-7 days in advance of running out of medications Perform all self care activities independently  Perform IADL's (shopping, preparing meals, housekeeping, managing finances) independently Call provider office for new concerns or questions   Follow Up Plan:  The patient has been provided with contact information for the care management team and has been advised to call with any health related questions or concerns.  No further follow up required: Admitted to SNF          Plan:  Patient reports she is admitting to [facility] today and will remain inpatient until she completes her rehab goals . Will discontinue TOC follow-up calls at this time  The patient has been provided with contact information for the care management team and has been advised to call with any health related questions or concerns.   Susa Loffler , BSN, RN [facility]   VBCI-Population Health RN Care Manager Direct Dial [phone]  Fax:  [phone] Website: [email or web address]

## 2023-10-09 NOTE — Telephone Encounter (Signed)
Called the patient to advise of recommendations. The patient's husband stated that the patient is currently in a nursing facility and that the systolic blood pressure this morning was 112. The nurse advised the husband to have the facility keep a log of the patient's blood pressure for a week, then call back with an update. The patient's husband verbalized understanding

## 2023-10-13 ENCOUNTER — Telehealth: Payer: Self-pay | Admitting: Internal Medicine

## 2023-10-13 NOTE — Telephone Encounter (Signed)
Copied from CRM [id number]. Topic: Medicare AWV >> Oct 13, 2023 11:29 AM Payton Doughty wrote: Reason for CRM: LVM 10/13/23 to r/s AWV 11/27/23 or change to mychart virtual. Due to the [facility] not in office on Thursday. Please call to r/s [facility]  [name]; Care Guide Ambulatory Clinical Support [facility] Direct Dial: [phone]

## 2023-10-14 NOTE — Telephone Encounter (Signed)
 Cadence made aware that systolic of 112 was with bisoprolol . PA recommended pt continue medication for now and monitor BP as previously advise by nurse.   Furth, Cadence H, PA-C  You4 days ago    Ruby, [place] a little confused now. Was the SBP 112 with or without the bisoprolol ?  If BP is OK, continue bisoprolol . Just so we don't go back and forth, it may be easier to bring her in once the nurse does a BP log for a week.

## 2023-10-17 ENCOUNTER — Inpatient Hospital Stay: Payer: Medicare Other | Admitting: Internal Medicine

## 2023-10-22 ENCOUNTER — Ambulatory Visit (INDEPENDENT_AMBULATORY_CARE_PROVIDER_SITE_OTHER): Payer: Medicare Other | Admitting: Internal Medicine

## 2023-10-22 ENCOUNTER — Encounter: Payer: Self-pay | Admitting: Internal Medicine

## 2023-10-22 VITALS — BP 108/60 | HR 108 | Temp 98.1°F | Ht 61.0 in | Wt 103.0 lb

## 2023-10-22 DIAGNOSIS — I2510 Atherosclerotic heart disease of native coronary artery without angina pectoris: Secondary | ICD-10-CM

## 2023-10-22 DIAGNOSIS — J9611 Chronic respiratory failure with hypoxia: Secondary | ICD-10-CM | POA: Diagnosis not present

## 2023-10-22 DIAGNOSIS — R0689 Other abnormalities of breathing: Secondary | ICD-10-CM

## 2023-10-22 DIAGNOSIS — J449 Chronic obstructive pulmonary disease, unspecified: Secondary | ICD-10-CM | POA: Diagnosis not present

## 2023-10-22 NOTE — Patient Instructions (Signed)
Continue oxygen as prescribed Continue nebulized therapy as needed every 6 hours  Avoid Allergens and Irritants Avoid secondhand smoke Avoid SICK contacts Recommend  Masking  when appropriate Recommend Keep up-to-date with vaccinations

## 2023-10-22 NOTE — Progress Notes (Signed)
[facility] Pulmonary Medicine Consultation      PULMONARY OFFICE FOLLOW UP NOTE  Date of initial consultation: 07/27/15 Reason for consultation: COPD  PT PROFILE:  89 y.o. F former smoker (up to 1.5 PPD, quit 1999) initially seen in 2016 for COPD. Previously followed by Dr Meredeth Ide. She was first diagnosed with COPD in 2012 at the time of a pneumonia.   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 PFTs 03/17/15: Spirometry reveals mild/moderate obstruction (FEV1 67% predicted, FEV1/FVC 62%).  Lung volumes normal.  Diffusion capacity markedly reduced at 35% predicted.  DLCO/VA 54% predicted Echocardiogram 08/22/15: LVEF 30-35%. Diffuse HK. prosthetic valves functioning well ONO on RA 02/22/16: significant desaturation to low of 78%. Total desaturation events: 170 Echocardiogram 09/17/16: LVEF 50-55%.  LA mildly dilated.  RVSP est normal Echocardiogram 07/17/18: LVEF 20-25%.  Diffuse hypokinesis.  Prosthetic mitral valve functioning normally.  LA mildly dilated.  RV moderately dilated.  RV systolic function moderately reduced.  PA systolic pressure could not be accurately estimated Echocardiogram 08/24/18: LVEF 35-40%.  Mild concentric hypertrophy.  Diffuse hypokinesis.  Bioprosthetic mitral valve functioning normally.  LA mildly dilated.  RV cavity mildly dilated.  RVSP could not be accurately estimated. Echocardiogram 10/26/18: LVEF 35-40%.  LA moderately dilated.  Bioprosthetic mitral valve present.  RV systolic function mildly reduced.  RV cavity moderately enlarged.  RA moderately dilated.  RVSP est 26 mmHg R heart cath 02/08/19: Mean PCWP 23 mmHg. Large V wave c/w MR.  Echocardiogram 04/01/19: LVEF 35-40%.  Moderate LVH.  LA mildly dilated.  Bioprosthetic mitral valve present.  RV mildly dilated with moderately reduced systolic function.  RVSP 35 mmHg. PAP 69/24  mmHg  PROBLEMS: Mild COPD CHF S/P Maze procedure, CABG with chronic sternal wound infection   CC  Follow-up assessment for respiratory failure Follow-up assessment for COPD  HPI Previous history of COVID-19 infection June 2020 Chronic shortness of breath and dyspnea on exertion  Recent COPD exacerbation with hospitalization Due to influenza A pneumonia No exacerbation at this time No evidence of heart failure at this time No evidence or signs of infection at this time No respiratory distress No fevers, chills, nausea, vomiting, diarrhea No evidence of lower extremity edema   A-fib seems to be well-controlled at this time on amiodarone therapy Currently on 100 mg daily  Chronic Hypoxic resp failure due to COPD -Patient benefits from oxygen therapy 2L [place]  -recommend using oxygen as prescribed -patient needs this for survival   BP 108/60 (BP Location: Left Arm, Patient Position: Sitting, Cuff Size: Normal)   Pulse (!) 108   Temp 98.1 F (36.7 C) (Temporal)   Ht 5\' 1"  (1.549 m)   Wt 103 lb (46.7 kg)   SpO2 98%   BMI 19.46 kg/m    Review of Systems: Gen:  Denies  fever, sweats, chills weight loss  HEENT: Denies blurred vision, double vision, ear pain, eye pain, hearing loss, nose bleeds, sore throat Cardiac:  No dizziness, chest pain or heaviness, chest tightness,edema, No JVD Resp:   No cough, -sputum production, +shortness of breath,-wheezing, -hemoptysis,  Other:  All other systems negative   Physical Examination:   General Appearance: No distress  EYES PERRLA, EOM intact.   NECK Supple, No JVD Pulmonary: normal breath sounds, No wheezing.  CardiovascularNormal S1,S2.  No m/r/g.   Abdomen: Benign, Soft, non-tender. Neurology UE/LE 5/5 strength, no focal deficits Ext pulses intact, cap  refill intact ALL OTHER ROS ARE NEGATIVE       Latest Ref Rng & Units 09/29/2023    4:29 AM 09/27/2023    5:47 AM 09/26/2023    4:48 AM  BMP  Glucose 70 - 99 mg/dL 161   096  045   BUN 8 - 23 mg/dL 47  27  15   Creatinine 0.44 - 1.00 mg/dL 4.09  8.11  9.14   Sodium 135 - 145 mmol/L 142  138  137   Potassium 3.5 - 5.1 mmol/L 4.9  4.9  4.4   Chloride 98 - 111 mmol/L 93  97  95   CO2 22 - 32 mmol/L 39  32  32   Calcium 8.9 - 10.3 mg/dL 8.8  8.8  8.3        Latest Ref Rng & Units 09/30/2023    5:48 AM 09/27/2023    5:47 AM 09/26/2023    4:48 AM  CBC  WBC 4.0 - 10.5 K/uL 10.0  10.4  10.3   Hemoglobin 12.0 - 15.0 g/dL 78.2  95.6  9.6   Hematocrit 36.0 - 46.0 % 32.8  32.0  28.8   Platelets 150 - 400 K/uL 286  169  126    CXR 02/22/19: Status post sternotomy.  AICD in place.  Cardiomegaly.  Patchy bilateral infiltrates.   ASSESSMENT PLAN 89 year old pleasant white female seen today for follow-up assessment for COPD with a history of CAD with AICD with previous COVID-19 infection associate with chronic hypoxic respiratory failure and respiratory insufficiency  COPD severe Gold stage D Chronic shortness of breath and dyspnea on exertion Continue nebulized bronchodilator therapy Patient is unable to tolerate traditional inhaler therapy due to respiratory insufficiency   Chronic Hypoxic resp failure due to COPD -Patient benefits from oxygen therapy 2L [place]  -recommend using oxygen as prescribed -patient needs this for survival  Amiodarone therapy for A-fib Continue amiodarone as prescribed At this time I do not see any indication for obtaining a CT chest since she is already on oxygen therapy for pulmonary and heart disease Follow-up cardiology as scheduled   Chronic respiratory insufficiency patient is very debilitated and her respiratory status is tenuous oxygen therapy is vital in needed for survival continue nebulized therapy as prescribed  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 Continue anticoagulation as prescribed Continue oxygen as prescribed    chronic heart failure with pulmonary hypertension follow-up cardiology  continue amiodarone continue anticoagulation Chronic respiratory insufficiency Follow-up cardiology   MEDICATION ADJUSTMENTS/LABS AND TESTS ORDERED: Continue oxygen as prescribed Continue nebulized therapy as needed every 6 hours  Avoid Allergens and Irritants Avoid secondhand smoke Avoid SICK contacts Recommend  Masking  when appropriate Recommend Keep up-to-date with vaccinations   CURRENT MEDICATIONS REVIEWED AT LENGTH WITH PATIENT TODAY   Patient  satisfied with Plan of action and management. All questions answered   Follow up 6 months    I spent a total of 47 minutes reviewing chart data, face-to-face evaluation with the patient, counseling and coordination of care as detailed above.      Lucie Leather, M.D.  [facility]  Medical Director [facility] Medical Director [facility] Cardio-Pulmonary Department

## 2023-10-24 ENCOUNTER — Telehealth: Payer: Self-pay | Admitting: Cardiovascular Disease

## 2023-10-24 NOTE — Telephone Encounter (Signed)
Called and spoke with spouse per DPR. Spouse reports that patient was recently admitted to the hospital for flu. Spouse states that the patient had tachycardia while in the hospital. Patient is currently in rehab with plan to discharge on 11/25/23. Spouse reports that patient heart rate has been around 75 with rest but increase to 115 with exertion. Husband reports that the patient is asymptomatic with tachycardia. Patient is currently scheduled to be seen in clinic on 11/20/23 but husband requesting a sooner appointment. Patient scheduled in clinic on 10/29/23.

## 2023-10-24 NOTE — Telephone Encounter (Signed)
Pt's spouse requesting cb since pt will be getting discharged from nursing home and wants to discuss pt's HR and sending a remote check in to office

## 2023-10-28 ENCOUNTER — Telehealth: Payer: Self-pay | Admitting: Internal Medicine

## 2023-10-28 NOTE — Telephone Encounter (Signed)
 Verbal Given

## 2023-10-28 NOTE — Telephone Encounter (Signed)
Home Health Verbal Orders - Caller/Agency: Fawn [facility], [facility]  Callback Number: [phone] Service Requested: Occupational Therapy and Physical Therapy Frequency: Start of care 10/31/2023 patient's request because of weather  Any new concerns about the patient?

## 2023-10-29 ENCOUNTER — Ambulatory Visit: Payer: Medicare Other | Admitting: Cardiology

## 2023-10-29 ENCOUNTER — Telehealth: Payer: Self-pay

## 2023-10-29 NOTE — Transitions of Care (Post Inpatient/ED Visit) (Signed)
10/29/2023  Name: [name] MRN: [id number] DOB: 1935-02-21  Today's TOC FU Call Status: Today's TOC FU Call Status:: Successful TOC FU Call Completed TOC FU Call Complete Date: 10/29/23 Patient's Name and Date of Birth confirmed.  Transition Care Management Follow-up Telephone Call Date of Discharge: 10/28/23 Discharge Facility: Other ([facility]) Name of [facility] ([facility]) [facility]: [facility] Type of Discharge: Inpatient Admission Primary Inpatient Discharge Diagnosis:: UTI How have you been since you were released from the hospital?: Better Any questions or concerns?: No  Items Reviewed: Did you receive and understand the discharge instructions provided?: Yes Medications obtained,verified, and reconciled?: Yes (Medications Reviewed) Any new allergies since your discharge?: No Dietary orders reviewed?: Yes Do you have support at home?: Yes People in Home: spouse  Medications Reviewed Today: Medications Reviewed Today     Reviewed by Karena Addison, LPN (Licensed Practical Nurse) on 10/29/23 at [phone]  Med List Status: <None>   Medication Order Taking? Sig Documenting Provider Last Dose Status Informant  acetaminophen (TYLENOL) 500 MG tablet [id number] Yes Take 1,000 mg by mouth every 6 (six) hours as needed (for pain.). [provider] Taking Active Spouse/Significant Other, Self  amiodarone (PACERONE) 200 MG tablet [id number] Yes Take 1 tablet (200 mg) 5 days per week Duke Salvia, MD Taking Active Self  bisoprolol (ZEBETA) 5 MG tablet [id number] Yes TAKE 1/2 TABLET BY MOUTH DAILY Duke Salvia, MD Taking Active Self  Cyanocobalamin (VITAMIN B-12 PO) [id number] Yes Take 1,000 mcg by mouth daily. [provider] Taking Active Self  diphenhydrAMINE (BENADRYL) 25 mg capsule [id number] Yes Take 25 mg by mouth every 6 (six) hours as needed for allergies. [provider] Taking Active Spouse/Significant Other, Self  docusate sodium  (COLACE) 100 MG capsule [id number] Yes Take 100 mg by mouth 2 (two) times daily as needed (constipation.). [provider] Taking Active Spouse/Significant Other, Self  feeding supplement (ENSURE ENLIVE / ENSURE PLUS) LIQD [id number] Yes Take 237 mLs by mouth 2 (two) times daily between meals. Pennie Banter, DO Taking Active   Ferrous Sulfate (IRON PO) [id number] Yes Take 1 tablet by mouth daily. [provider] Taking Active Self  furosemide (LASIX) 20 MG tablet [id number] Yes TAKE 2 TABLETS (40 MG) IN THE MORNING.  Patient taking differently: Take 40 mg by mouth daily. TAKE 2 TABLETS (40 MG) IN THE MORNING.   Duke Salvia, MD Taking Active Self  gabapentin (NEURONTIN) 100 MG capsule [id number] Yes Take 2 capsules (200 mg total) by mouth at bedtime. Reubin Milan, MD Taking Active Self  guaiFENesin-dextromethorphan [facility] DM) 100-10 MG/5ML syrup [id number] Yes Take 10 mLs by mouth every 6 (six) hours as needed for cough. Esaw Grandchild A, DO Taking Active   ipratropium-albuterol (DUONEB) 0.5-2.5 (3) MG/3ML SOLN [id number] Yes Inhale 3 mLs into the lungs every 6 (six) hours as needed. Salena Saner, MD Taking Active   levothyroxine (SYNTHROID) 88 MCG tablet [id number] Yes TAKE 1 TABLET BY MOUTH EVERY DAY BEFORE BREAKFAST Reubin Milan, MD Taking Active Self  Multiple Vitamins-Minerals (PRESERVISION AREDS 2 PO) [id number] Yes Take 1 tablet by mouth 2 (two) times daily.  [provider] Taking Active Spouse/Significant Other, Self  OXYGEN [id number] Yes Inhale 2 L into the lungs continuous. [provider] Taking Active Spouse/Significant Other, Self           Med Note (LLOYD, CRYSTAL L   Thu Oct 02, 2023 10:15 AM) Currently on 4 l/m  pantoprazole (PROTONIX) 40 MG  tablet [id number] Yes TAKE 1 TABLET BY MOUTH TWICE A DAY Reubin Milan, MD Taking Active Self  sacubitril-valsartan [facility]) 49-51 MG [id number] Yes Take 1 tablet (49/51 mg) by mouth  twice daily Duke Salvia, MD Taking Active Self           Med Note (LLOYD, CRYSTAL L   Thu Oct 02, 2023 10:16 AM) On hold until seen by PCP/Cardiologist to resume   simvastatin (ZOCOR) 10 MG tablet [id number] Yes TAKE 1 TABLET BY MOUTH EVERYDAY AT BEDTIME Iran Ouch, MD Taking Active Self  spironolactone (ALDACTONE) 25 MG tablet [id number] Yes TAKE 1 TABLET BY MOUTH EVERY DAY Iran Ouch, MD Taking Active Self  warfarin (COUMADIN) 2.5 MG tablet [id number] Yes TAKE 1/2 A TABLET TO 1 TABLET BY MOUTH DAILY AS DIRECTED BY THE COUMADIN CLINIC. Iran Ouch, MD Taking Active Self           Med Note (LLOYD, CRYSTAL L   Thu Oct 02, 2023 10:16 AM) Verified alternating doses with spouse             Home Care and Equipment/Supplies: Were Home Health Services Ordered?: Yes Name of Home Health Agency:: [facility] Has Agency set up a time to come to your home?: Yes First Home Health Visit Date: 10/31/23 Any new equipment or medical supplies ordered?: NA  Functional Questionnaire: Do you need assistance with bathing/showering or dressing?: No Do you need assistance with meal preparation?: No Do you need assistance with eating?: No Do you have difficulty maintaining continence: No Do you need assistance with getting out of bed/getting out of a chair/moving?: No Do you have difficulty managing or taking your medications?: No  Follow up appointments reviewed: PCP Follow-up appointment confirmed?: Yes Date of PCP follow-up appointment?: 11/24/23 Follow-up Provider: [facility] Follow-up appointment confirmed?: Yes Date of Specialist follow-up appointment?: 11/20/23 Follow-Up Specialty Provider:: cardio Do you need transportation to your follow-up appointment?: No Do you understand care options if your condition(s) worsen?: Yes-patient verbalized understanding    SIGNATURE Karena Addison, LPN [facility] Nurse Health Advisor Direct Dial [phone]

## 2023-10-30 ENCOUNTER — Other Ambulatory Visit: Payer: Self-pay | Admitting: Internal Medicine

## 2023-10-30 DIAGNOSIS — K219 Gastro-esophageal reflux disease without esophagitis: Secondary | ICD-10-CM

## 2023-10-30 NOTE — Telephone Encounter (Signed)
Requested Prescriptions  Pending Prescriptions Disp Refills   pantoprazole (PROTONIX) 40 MG tablet [Pharmacy Med Name: PANTOPRAZOLE SOD DR 40 MG TAB] 180 tablet 0    Sig: TAKE 1 TABLET BY MOUTH TWICE A DAY     Gastroenterology: Proton Pump Inhibitors Passed - 10/30/2023  1:21 PM      Passed - Valid encounter within last 12 months    Recent Outpatient Visits           6 months ago Adult hypothyroidism   [place] [facility] at [facility], Nyoka Cowden, MD   9 months ago Non-healing ulcer of lower leg, right, limited to breakdown of skin [facility])   [place] [facility] at [facility], Nyoka Cowden, MD   10 months ago Cellulitis of right lower extremity   [place] [facility] at [facility], Nyoka Cowden, MD   11 months ago Cellulitis of right lower extremity   [place] [facility] Medicine at [facility], Nyoka Cowden, MD   1 year ago Essential hypertension   [place] [facility] Medicine at [facility], Nyoka Cowden, MD       Future Appointments             In 3 weeks Kirke Corin, Chelsea Aus, MD  [facility] at [place]   In 3 weeks Judithann Graves, Nyoka Cowden, MD [facility] Primary Care & Sports Medicine at [facility], [facility]   In 4 months Judithann Graves, Nyoka Cowden, MD [facility] Primary Care & Sports Medicine at [facility], [facility]   In 5 months Judithann Graves, Nyoka Cowden, MD [facility] Primary Care & Sports Medicine at [facility], [facility]

## 2023-11-04 ENCOUNTER — Ambulatory Visit: Payer: Self-pay | Admitting: Internal Medicine

## 2023-11-04 ENCOUNTER — Telehealth: Payer: Self-pay | Admitting: Cardiovascular Disease

## 2023-11-04 NOTE — Telephone Encounter (Signed)
 Spoke with patients husband and he reports that her heart rates have been in the 100's and wanted to call and see if that was ok. Reviewed normal heart rate ranges and that if it goes up temporarily with exertion that can be expected. He was not concerned but wanted to just run it by someone. Given her deconditioning from being in the hospital and nursing home reviewed that can also make her heart rate a little faster. He verbalized understanding of our conversation, appointment confirmed, and had no further questions at this time.

## 2023-11-04 NOTE — Telephone Encounter (Signed)
 Called Cindy left VM gving verbal order.  KP  Copied from CRM [id number]. Topic: Clinical - Home Health Verbal Orders >> Nov 04, 2023  8:26 AM Patsy Lager T wrote: Caller/Agency: Arline Asp from [facility] Number: [phone] Service Requested: Physical Therapy Frequency: 1w1x, 2w3x and 1w5x Any new concerns about the patient? No

## 2023-11-04 NOTE — Telephone Encounter (Signed)
 STAT if HR is under 50 or over 120 (normal HR is 60-100 beats per minute)  What is your heart rate?   115  Do you have a log of your heart rate readings (document readings)?   No  Do you have any other symptoms?  No  Husband [name]) stated patient's HR has increased over weekend and is concerned it has not come down. Husband wants advice on next steps.

## 2023-11-04 NOTE — Telephone Encounter (Signed)
 Chief Complaint: Tachycardia Symptoms: Tachycardia Frequency: Off and on Pertinent Negatives: Patient denies chest pain, SOB, palpitations, fluttering, dizziness, irregularity. Disposition: [] ED /[] Urgent Care (no appt availability in office) / [x] Appointment(In office/virtual)/ []  [facility]/ [] Home Care/ [x] Refused Recommended Disposition /[] [facility]/ []  Follow-up with PCP Additional Notes: Patient's caregiver Shawn called with complaints of patient having high heart rate. Patient's husband Duffy Rhody [facility]) took over the call and stated that patient has a history of A-fib, takes coumadin to manage and has a pacemaker that was put in some years ago to manage heart rate. Patient's husband states that the most recent incident of high heart rate happened when she was in the nursing home 21 days ago and she was having heart rates that were around 120 and lasted for a few days before returning to normal. Patient's husband stated that tachycardia occurred again over this last weekend. Patient's husband unable to tell this RN at what rate her pacemaker is set to pace heart when HR is elevated and did not know what the patient's normal heart rate is, stating "she is an 89 year old woman that is otherwise healthy when her heart is not racing." Patient's husband states that patient is sitting "right here," and "is not complaining of anything other than her heart is racing." Patient drank one cup of coffee earlier today and is not in any other stressful/anxious environments. Current HR is at 112-120bpm, and patient denies fluttering, SOB, palpations, irregularity, chest pain, or dizziness. Patient advised by this RN that patient should be seen eithin the next 4 hours, to which the Patient's husband refused stating, "She's not going to go to Dr. Karn Cassis office. She can't do anything for her. And the problem going to the office is that she needs a wheelchair and she has to go to the back  entrance and in order to do that you have to call up there and you're gonna be on the phone for a half hour trying to get accommodated. Going to the doctor's is not fun these days. Why don't we just cut all the red tape and just get her to a cardiologist? We'll do whatever the doctor says" Patient's husband advised by this RN that note will be routed to office for PCP review and to call back with worsening symptoms. Patient's husband verbalized understanding.   Copied from CRM [id number]. Topic: Clinical - Red Word Triage >> Nov 04, 2023 12:18 PM Turkey B wrote: Shaun [facility] called in about pt with heart issues/ rate over 100, thinks may start to go in afib Reason for Disposition  Age > 60 years  (Exception: Brief heartbeat symptoms that went away and now feels well.)  Answer Assessment - Initial Assessment Questions 1. DESCRIPTION: "Please describe your heart rate or heartbeat that you are having" (e.g., fast/slow, regular/irregular, skipped or extra beats, "palpitations")     Racing 2. ONSET: "When did it start?" (Minutes, hours or days)      Yesterday, 21 days,  3. DURATION: "How long does it last" (e.g., seconds, minutes, hours)     *No Answer* 4. PATTERN "Does it come and go, or has it been constant since it started?"  "Does it get worse with exertion?"   "Are you feeling it now?"     Couple years 5. TAP: "Using your hand, can you tap out what you are feeling on a chair or table in front of you, so that I can hear?" (Note: not all patients can do this)       *  No Answer* 6. HEART RATE: "Can you tell me your heart rate?" "How many beats in 15 seconds?"  (Note: not all patients can do this)       112-115- may run up to 120 7. RECURRENT SYMPTOM: "Have you ever had this before?" If Yes, ask: "When was the last time?" and "What happened that time?"      22 days 8. CAUSE: "What do you think is causing the palpitations?"     "She has a history of A-fib." 9. CARDIAC HISTORY: "Do you have  any history of heart disease?" (e.g., heart attack, angina, bypass surgery, angioplasty, arrhythmia)      She has  10. OTHER SYMPTOMS: "Do you have any other symptoms?" (e.g., dizziness, chest pain, sweating, difficulty breathing)       Denies  Protocols used: Heart Rate and Heartbeat Questions-A-AH

## 2023-11-05 ENCOUNTER — Telehealth: Payer: Self-pay

## 2023-11-05 NOTE — Telephone Encounter (Signed)
 Copied from CRM [id number]. Topic: Clinical - Home Health Verbal Orders >> Nov 05, 2023  1:27 PM Higinio Roger wrote: Caller/Agency: [facility] Callback Number: [phone] Bonney Roussel, Darral Dash) Any new concerns about the patient? Yes Patient declined occupational therapy.

## 2023-11-05 NOTE — Telephone Encounter (Signed)
 Called and left VM informing to follow up with cardiology. Go to ER if pt has symptoms of tachycardia, chest pain or SOB.

## 2023-11-06 ENCOUNTER — Ambulatory Visit: Payer: Medicare Other | Admitting: Internal Medicine

## 2023-11-12 ENCOUNTER — Ambulatory Visit: Payer: Medicare Other | Attending: Cardiovascular Disease

## 2023-11-12 DIAGNOSIS — I340 Nonrheumatic mitral (valve) insufficiency: Secondary | ICD-10-CM | POA: Diagnosis present

## 2023-11-12 DIAGNOSIS — Z5181 Encounter for therapeutic drug level monitoring: Secondary | ICD-10-CM | POA: Insufficient documentation

## 2023-11-12 DIAGNOSIS — Z953 Presence of xenogenic heart valve: Secondary | ICD-10-CM | POA: Diagnosis not present

## 2023-11-12 LAB — POCT INR: INR: 3.9 — AB (ref 2.0–3.0)

## 2023-11-12 NOTE — Patient Instructions (Signed)
 Hold today only and take 0.5 tablet Thursday then Continue 1 tablet daily EXCEPT 0.5 TABLET ON MONDAYS, WEDNESDAYS, and FRIDAYS;  - Recheck INR in 2 weeks.

## 2023-11-20 ENCOUNTER — Encounter: Payer: Self-pay | Admitting: Cardiovascular Disease

## 2023-11-20 ENCOUNTER — Ambulatory Visit: Payer: Medicare Other | Attending: Cardiovascular Disease | Admitting: Cardiovascular Disease

## 2023-11-20 VITALS — BP 92/46 | HR 67 | Ht 61.0 in | Wt 106.4 lb

## 2023-11-20 DIAGNOSIS — E785 Hyperlipidemia, unspecified: Secondary | ICD-10-CM | POA: Diagnosis present

## 2023-11-20 DIAGNOSIS — I5023 Acute on chronic systolic (congestive) heart failure: Secondary | ICD-10-CM

## 2023-11-20 DIAGNOSIS — Z953 Presence of xenogenic heart valve: Secondary | ICD-10-CM

## 2023-11-20 DIAGNOSIS — I4819 Other persistent atrial fibrillation: Secondary | ICD-10-CM

## 2023-11-20 MED ORDER — TORSEMIDE 20 MG PO TABS: 20.0000 mg | ORAL_TABLET | Freq: Every day | ORAL | 1 refills | Status: DC

## 2023-11-20 MED ORDER — ENTRESTO 24-26 MG PO TABS: 1.0000 | ORAL_TABLET | Freq: Two times a day (BID) | ORAL | 1 refills | Status: DC

## 2023-11-20 NOTE — Patient Instructions (Signed)
 Medication Instructions:  Your physician recommends the following medication changes.  STOP TAKING: Furosemide  START TAKING: Torsemide 20 mg once daily  DECREASE: Entresto to 24-26 mg bid    *If you need a refill on your cardiac medications before your next appointment, please call your pharmacy*   Lab Work: Your provider would like for you to return in 1 week to have the following labs drawn: CBC and BMET.   Please go to [facility] [address] ([facility] [address], [name] [address] You do not need an appointment.  They are open from 8 am- 4:30 pm.  Lunch from 1:00 pm- 2:00 pm You will not need to be fasting.   You may also go to one of the following [facility]:  [address] [place], [place] [address] Phone: [phone] Lab hours: Mon-Fri 8 am- 5 pm    Lunch 12 pm- 1 pm  [address] [place],  [place]  [address]  [place] Phone: [phone] Lab hours: 7 am- 4 pm Lunch 12 pm-1 pm   [address] [place],  [place]  [address]  [place] Phone: [phone] Lab hours: Mon-Fri 8 am- 5 pm    Lunch 12 pm- 1 pm  If you have labs (blood work) drawn today and your tests are completely normal, you will receive your results only by: MyChart Message (if you have MyChart) OR A paper copy in the mail If you have any lab test that is abnormal or we need to change your treatment, we will call you to review the results.   Testing/Procedures: None ordered   Follow-Up: At [facility], you and your health needs are our priority.  As part of our continuing mission to provide you with exceptional heart care, we have created designated Provider Care Teams.  These Care Teams include your primary Cardiologist (physician) and Advanced Practice Providers (APPs -  Physician Assistants and Nurse Practitioners) who all work together to provide you with the care you need, when you need it.  We recommend signing up for the patient portal called "MyChart".  Sign up  information is provided on this After Visit Summary.  MyChart is used to connect with patients for Virtual Visits (Telemedicine).  Patients are able to view lab/test results, encounter notes, upcoming appointments, etc.  Non-urgent messages can be sent to your provider as well.   To learn more about what you can do with MyChart, go to [email or web address].    Your next appointment:   1 month(s)  Provider:   You will see one of the following Advanced Practice Providers on your designated Care Team:   Nicolasa Ducking, NP Eula Listen, PA-C Cadence Fransico Michael, PA-C Charlsie Quest, NP Carlos Levering, NP

## 2023-11-20 NOTE — Progress Notes (Signed)
 Cardiology Office Note   Date:  11/20/2023   ID:  [name], DOB 02-23-1935, MRN [id number]  PCP:  Reubin Milan, MD  Cardiologist:   Lorine Bears, MD   Chief Complaint  Patient presents with   Follow-up    6 month follow up. Patient states that she has swelling in her ankles. Patient has been having issues with her heart rate. Patient states at night her heart rate is worse at night.  Meds review.      History of Present Illness: [name] is a 89 y.o. female who presents for a follow-up visit regarding chronic systolic heart failure, atrial fibrillation and mitral regurgitation.  She has known history of COPD, breast cancer status post right partial mastectomy, hypothyroidism, hyperlipidemia and Mnire disease.   She underwent mitral valve replacement with a bioprosthetic valve, tricuspid valve repair, one-vessel CABG with LIMA to LAD and maze procedure in August 2016. This was complicated by sternal wound infection which required debridement. She also had complete heart block and underwent permanent pacemaker placement.  In 08/2017, she had worsening heart failure.  Echocardiogram showed an EF of 30 to 35%. She was seen by EP and it was felt that LV dysfunction was in the context of RV apical pacing. That was inactivated and she noted immediate improvement in symptoms.  She again had worsening heart failure symptoms in late 2019.  She had a drop in EF to 20 to 25% in the setting of atrial fibrillation with RVR.  She was rate controlled and started on amiodarone and subsequently underwent cardioversion with restoration of sinus rhythm.  EF improved to 35 to 40% after that.   Right heart catheterization was done in June 2020 which showed moderately elevated filling pressures, severe pulmonary hypertension and mildly reduced cardiac output.  There was no evidence of left-to-right intracardiac shunting by saturation run.  Prominent V waves were noted on pulmonary capillary  wedge pressure tracing suggestive of significant mitral regurgitation.  Symptoms improved after increasing furosemide.   She underwent attempted upgrade of her pacemaker to a biventricular device but the procedure was not successful due to inability to place an LV lead. She is maintaining in sinus rhythm with amiodarone.  Most recent echocardiogram in October 2024 showed an EF of 30 to 35% with stable mitral valve prosthesis with no significant regurgitation.  Mean gradient was 6 mmHg.  She was hospitalized in January with COPD exacerbation and was diagnosed with influenza A infection.  She required increased oxygen and was discharged to a skilled nursing facility.  Her oxygen requirement gradually decreased back to 2 L which is her baseline.  She had issues with volume overload and reports intermittent tachycardia.  The dose of furosemide was increased to 40 mg daily but has not responded very well to that.  No chest pain.   Past Medical History:  Diagnosis Date   Acute on chronic respiratory failure with hypoxia (HCC) 02/04/2019   Added automatically from request for surgery [phone]   Acute on chronic systolic heart failure (HCC)    Anxiety    Arthritis    "hands" (11/10/2015)   Breast cancer (HCC) 2002   right   CAD s/p CABG x 1    a. 04/2015 LIMA to LAD   Chronic combined systolic (congestive) and diastolic (congestive) heart failure (HCC)    a. 01/2015 Echo: EF 50-55%, Gr2 DD (preop valve surgery); b. 08/2015 Echo: EF 30-35%, diff HK. Gr2 DD; c. 09/2016 Echo:  EF 50-55%; d. 07/2018 Echo: EF 20-25%; e. 03/2019 Echo: EF 35-40%, diff HK. Mod red RV fxn, RVSP 34.67mmHg. Mildly dil LA. Nl MV prosthesis. Triv AI.   COPD (chronic obstructive pulmonary disease) (HCC)    Coronary artery disease    GERD (gastroesophageal reflux disease)    History of colon polyps    History of mitral valve replacement    a. 04/2015 s/p 27 mm Edwards [facility] Mitral bovine bioprosthetic tissue valve   Hyperlipidemia     Hypertension    Hypothyroidism    Influenza A 09/25/2023   Maze operation for AF w/ LAA clipping    a. 04/2015 Complete bilateral atrial lesion set using cryothermy and bipolar radiofrequency ablation with clipping of LA appendage   Meniere disease    Meniere's disease    On home oxygen therapy    "2L at night" (11/10/2015)   PAF (paroxysmal atrial fibrillation) (HCC)    Personal history of radiation therapy    Pneumonia ~ 2010   PONV (postoperative nausea and vomiting)    Pt felt like eardrums were gonna pop   Post-surgical complete heart block, symptomatic    a. 04/2015 s/p MDT [id number] Advisa DR MRI DC PPM.   Presence of permanent cardiac pacemaker    Pulmonary hypertension (HCC)    Respiratory failure (HCC) 02/22/2019   Surgical wound, non healing - chest wall 11/10/2015   Tricuspid Regurgitation s/p Repair    a. 04/2015 s/p 26 mm Edwards mc3 ring annuloplasty   Wound infection 10/13/2015   Superficial sternal wound infection    Past Surgical History:  Procedure Laterality Date   APPENDECTOMY     APPLICATION OF A-CELL OF CHEST/ABDOMEN N/A 11/21/2015   Procedure: APPLICATION OF A-CELL OF CHEST/ABDOMEN;  Surgeon: Alena Bills Dillingham, DO;  [facility]: [facility];  Service: Plastics;  Laterality: N/A;   APPLICATION OF A-CELL OF CHEST/ABDOMEN Right 12/21/2015   Procedure: APPLICATION OF A-CELL OF RIGHT CHEST;  Surgeon: Alena Bills Dillingham, DO;  [facility]: [facility];  Service: Plastics;  Laterality: Right;   APPLICATION OF A-CELL OF CHEST/ABDOMEN Right 01/11/2016   Procedure: APPLICATION OF A-CELL OF RIGHT CHEST;  Surgeon: Alena Bills Dillingham, DO;  [facility]: [facility];  Service: Plastics;  Laterality: Right;   APPLICATION OF A-CELL OF CHEST/ABDOMEN Right 02/12/2016   Procedure: APPLICATION OF A-CELL TO RIGHT CHEST WOUND;  Surgeon: Alena Bills Dillingham, DO;  [facility];  Service: Plastics;  Laterality: Right;   APPLICATION OF WOUND VAC N/A 05/09/2015   Procedure: APPLICATION OF WOUND VAC;  Surgeon: Purcell Nails, MD;  [facility];  Service: Thoracic;  Laterality: N/A;   APPLICATION OF WOUND VAC N/A 11/13/2015   Procedure: APPLICATION OF WOUND VAC;  Surgeon: Purcell Nails, MD;  [facility];  Service: Thoracic;  Laterality: N/A;   APPLICATION OF WOUND VAC N/A 11/21/2015   Procedure: APPLICATION OF WOUND VAC;  Surgeon: Alena Bills Dillingham, DO;  [facility];  Service: Plastics;  Laterality: N/A;   APPLICATION OF WOUND VAC Right 12/21/2015   Procedure: APPLICATION OF WOUND VAC to right chest wall ;  Surgeon: Alena Bills Dillingham, DO;  [facility];  Service: Plastics;  Laterality: Right;   APPLICATION OF WOUND VAC Right 01/11/2016   Procedure: APPLICATION OF WOUND VAC RIGHT CHEST ;  Surgeon: Alena Bills Dillingham, DO;  [facility];  Service: Plastics;  Laterality: Right;   APPLICATION OF WOUND VAC Right 01/24/2016   Procedure: APPLICATION OF WOUND VAC RIGHT CHEST WALL;  Surgeon: Alena Bills Dillingham, DO;  [facility];  Service: Plastics;  Laterality: Right;   APPLICATION OF WOUND VAC Right 02/12/2016   Procedure: RE-APPLICATION OF WOUND VAC TO RIGHT CHEST WOUND;  Surgeon: Alena Bills Dillingham, DO;  [facility];  Service: Plastics;  Laterality: Right;   BIV UPGRADE N/A 06/07/2019   Procedure: BIV UPGRADE;  Surgeon: Duke Salvia, MD;  Location: [facility] INVASIVE CV LAB;  Service: Cardiovascular;  Laterality: N/A;   BREAST BIOPSY Left 11/25/06   neg   BREAST BIOPSY Left 01/20/12   /clip-neg   CARDIAC CATHETERIZATION  11/2013   [facility]   CARDIAC CATHETERIZATION  10/2014   [facility]   CARDIAC VALVE REPLACEMENT     CARDIOVERSION N/A 07/20/2018   Procedure: CARDIOVERSION;  Surgeon: Iran Ouch, MD;  Location: [facility] ORS;  Service: Cardiovascular;  Laterality: N/A;   CARDIOVERSION N/A 09/18/2020   Procedure: CARDIOVERSION;  Surgeon: Antonieta Iba, MD;  Location: [facility] ORS;  Service: Cardiovascular;  Laterality: N/A;   CATARACT EXTRACTION W/ INTRAOCULAR LENS  IMPLANT, BILATERAL Bilateral 2014    CLIPPING OF ATRIAL APPENDAGE N/A 04/25/2015   Procedure: CLIPPING OF ATRIAL APPENDAGE;  Surgeon: Purcell Nails, MD;  [facility];  Service: Open Heart Surgery;  Laterality: N/A;   COCHLEAR IMPLANT Left 2005?   COLONOSCOPY WITH PROPOFOL N/A 01/20/2018   Procedure: COLONOSCOPY WITH PROPOFOL;  Surgeon: Toledo, Boykin Nearing, MD;  Location: [facility] ENDOSCOPY;  Service: Gastroenterology;  Laterality: N/A;   CORONARY ANGIOPLASTY     CORONARY ARTERY BYPASS GRAFT N/A 04/25/2015   Procedure: CORONARY ARTERY BYPASS GRAFTING (CABG) x ONE, using left internal mammary artery;  Surgeon: Purcell Nails, MD;  [facility];  Service: Open Heart Surgery;  Laterality: N/A;   DILATION AND CURETTAGE OF UTERUS  "several before hysterectomy"   EP IMPLANTABLE DEVICE N/A 05/02/2015   Procedure: Pacemaker Implant;  Surgeon: Hillis Range, MD;  [facility];  Service: Cardiovascular;  Laterality: N/A;   ESOPHAGOGASTRODUODENOSCOPY (EGD) WITH PROPOFOL N/A 01/20/2018   Procedure: ESOPHAGOGASTRODUODENOSCOPY (EGD) WITH PROPOFOL;  Surgeon: Toledo, Boykin Nearing, MD;  Location: [facility] ENDOSCOPY;  Service: Gastroenterology;  Laterality: N/A;   EYE SURGERY     I & D EXTREMITY Right 12/06/2015   Procedure: IRRIGATION AND DEBRIDEMENT RIGHT CHEST WALL WITH ACELL PLACEMENT AND VAC;  Surgeon: Alena Bills Dillingham, DO;  [facility];  Service: Plastics;  Laterality: Right;   INCISION AND DRAINAGE OF WOUND N/A 11/21/2015   Procedure: IRRIGATION AND DEBRIDEMENT WOUND;  Surgeon: Alena Bills Dillingham, DO;  [facility];  Service: Plastics;  Laterality: N/A;   INCISION AND DRAINAGE OF WOUND Right 12/21/2015   Procedure: IRRIGATION AND DEBRIDEMENT right chest wall WOUND;  Surgeon: Alena Bills Dillingham, DO;  [facility];  Service: Plastics;  Laterality: Right;  right chest wall    INCISION AND DRAINAGE OF WOUND Right 01/24/2016   Procedure: IRRIGATION AND DEBRIDEMENT RIGHT CHEST WALL WOUND;  Surgeon: Alena Bills Dillingham, DO;   [facility];  Service: Plastics;  Laterality: Right;   INCISION AND DRAINAGE OF WOUND Right 03/28/2016   Procedure: IRRIGATION AND DEBRIDEMENT RIGHT CHEST WALL WOUND WITH A Cell Placement;  Surgeon: Alena Bills Dillingham, DO;  [facility];  Service: Plastics;  Laterality: Right;   INCISION AND DRAINAGE OF WOUND Right 05/17/2016   Procedure: IRRIGATION AND DEBRIDEMENT OF RIGHT CHEST WOUND WITH A CELL PLACEMENT;  Surgeon: Alena Bills Dillingham, DO;  [facility];  Service: Plastics;  Laterality:  Right;   INSERT / REPLACE / REMOVE PACEMAKER     IRRIGATION AND DEBRIDEMENT OF WOUND WITH SPLIT THICKNESS SKIN GRAFT Right 01/11/2016   Procedure: IRRIGATION AND DEBRIDEMENT OF RIGHT CHEST WOUND ;  Surgeon: Alena Bills Dillingham, DO;  [facility];  Service: Plastics;  Laterality: Right;   MASTECTOMY, PARTIAL Right 2002   positive, partial   MAZE N/A 04/25/2015   Procedure: MAZE;  Surgeon: Purcell Nails, MD;  Location[facility] OR;  Service: Open Heart Surgery;  Laterality: N/A;   MITRAL VALVE REPAIR N/A 04/25/2015   Procedure: MITRAL VALVE  REPLACEMENT using a 27 mm Edwards Perimount Magna Mitral Ease Valve;  Surgeon: Purcell Nails, MD;  [facility];  Service: Open Heart Surgery;  Laterality: N/A;   RIGHT HEART CATH N/A 02/08/2019   Procedure: RIGHT HEART CATH;  Surgeon: Iran Ouch, MD;  Location: [facility] INVASIVE CV LAB;  Service: Cardiovascular;  Laterality: N/A;   SKIN SPLIT GRAFT Right 02/12/2016   Procedure: IRRIGATION AND DEBRIDEMENT RIGHT CHEST WOUND;  Surgeon: Alena Bills Dillingham, DO;  [facility];  Service: Plastics;  Laterality: Right;   STERNAL WIRES REMOVAL N/A 06/05/2015   Procedure: STERNAL WIRES REMOVAL;  Surgeon: Purcell Nails, MD;  [facility];  Service: Thoracic;  Laterality: N/A;   STERNAL WOUND DEBRIDEMENT N/A 05/09/2015   Procedure: STERNAL WOUND DEBRIDEMENT;  Surgeon: Purcell Nails, MD;  [facility];  Service: Thoracic;  Laterality: N/A;   STERNAL WOUND DEBRIDEMENT  N/A 11/13/2015   Procedure: Excisional drainage of RIGHT Chest wall mass and breast mass ;  Surgeon: Purcell Nails, MD;  [facility];  Service: Thoracic;  Laterality: N/A;   TEE WITHOUT CARDIOVERSION N/A 04/25/2015   Procedure: TRANSESOPHAGEAL ECHOCARDIOGRAM (TEE);  Surgeon: Purcell Nails, MD;  Location: [facility] OR;  Service: Open Heart Surgery;  Laterality: N/A;   TONSILLECTOMY     TRAM Right 11/18/2015   Procedure: VRAM Vertical Rectus Abdominus Muscle Flap;  Surgeon: Alena Bills Dillingham, DO;  [facility];  Service: Plastics;  Laterality: Right;  RIght Back   TRICUSPID VALVE REPLACEMENT N/A 04/25/2015   Procedure: TRICUSPID VALVE REPAIR;  Surgeon: Purcell Nails, MD;  [facility];  Service: Open Heart Surgery;  Laterality: N/A;   VAGINAL HYSTERECTOMY       Current Outpatient Medications  Medication Sig Dispense Refill   acetaminophen (TYLENOL) 500 MG tablet Take 1,000 mg by mouth every 6 (six) hours as needed (for pain.).     amiodarone (PACERONE) 200 MG tablet Take 1 tablet (200 mg) 5 days per week 90 tablet 3   bisoprolol (ZEBETA) 5 MG tablet TAKE 1/2 TABLET BY MOUTH DAILY 45 tablet 1   Cyanocobalamin (VITAMIN B-12 PO) Take 1,000 mcg by mouth daily.     diphenhydrAMINE (BENADRYL) 25 mg capsule Take 25 mg by mouth every 6 (six) hours as needed for allergies.     docusate sodium (COLACE) 100 MG capsule Take 100 mg by mouth 2 (two) times daily as needed (constipation.).     feeding supplement (ENSURE ENLIVE / ENSURE PLUS) LIQD Take 237 mLs by mouth 2 (two) times daily between meals.     Ferrous Sulfate (IRON PO) Take 1 tablet by mouth daily.     furosemide (LASIX) 20 MG tablet TAKE 2 TABLETS (40 MG) IN THE MORNING. (Patient taking differently: Take 40 mg by mouth daily. TAKE 2 TABLETS (40 MG) IN THE MORNING.)     gabapentin (NEURONTIN) 100 MG  capsule Take 2 capsules (200 mg total) by mouth at bedtime. 180 capsule 1   guaiFENesin-dextromethorphan (ROBITUSSIN DM) 100-10 MG/5ML syrup  Take 10 mLs by mouth every 6 (six) hours as needed for cough.     ipratropium-albuterol (DUONEB) 0.5-2.5 (3) MG/3ML SOLN Inhale 3 mLs into the lungs every 6 (six) hours as needed. 120 mL 2   levothyroxine (SYNTHROID) 88 MCG tablet TAKE 1 TABLET BY MOUTH EVERY DAY BEFORE BREAKFAST 90 tablet 1   Multiple Vitamins-Minerals (PRESERVISION AREDS 2 PO) Take 1 tablet by mouth 2 (two) times daily.      OXYGEN Inhale 2 L into the lungs continuous.     pantoprazole (PROTONIX) 40 MG tablet TAKE 1 TABLET BY MOUTH TWICE A DAY 180 tablet 0   [Paused] sacubitril-valsartan (ENTRESTO) 49-51 MG Take 1 tablet (49/51 mg) by mouth twice daily 180 tablet 3   simvastatin (ZOCOR) 10 MG tablet TAKE 1 TABLET BY MOUTH EVERYDAY AT BEDTIME 90 tablet 1   spironolactone (ALDACTONE) 25 MG tablet TAKE 1 TABLET BY MOUTH EVERY DAY 90 tablet 1   warfarin (COUMADIN) 2.5 MG tablet TAKE 1/2 A TABLET TO 1 TABLET BY MOUTH DAILY AS DIRECTED BY THE COUMADIN CLINIC. 90 tablet 1   No current facility-administered medications for this visit.    Allergies:   Ace inhibitors, Amoxicillin-pot clavulanate, Penicillins, Rosuvastatin, Nifedipine, Propranolol, Diazepam, Meperidine, and Propoxyphene    Social History:  The patient  reports that she quit smoking about 25 years ago. Her smoking use included cigarettes. She started smoking about 65 years ago. She has a 60 pack-year smoking history. She has never used smokeless tobacco. She reports that she does not currently use alcohol. She reports that she does not use drugs.   Family History:  The patient's family history includes Breast cancer in her maternal aunt and paternal aunt; CVA in her mother; Heart attack in her paternal uncle; Heart disease in her brother and father.    ROS:  Please see the history of present illness.   Otherwise, review of systems are positive for none.   All other systems are reviewed and negative.    PHYSICAL EXAM: VS:  Ht 5\' 1"  (1.549 m)   Wt 106 lb 6.4 oz (48.3  kg)   BMI 20.10 kg/m  , BMI Body mass index is 20.1 kg/m. GEN: Well nourished, well developed, in no acute distress  HEENT: normal  Neck: no carotid bruits, or masses.  Mild JVD Cardiac: RRR; no murmurs, rubs, or gallops, moderate edema worse on the left side Respiratory:  clear to auscultation bilaterally, normal work of breathing GI: soft, nontender, nondistended, + BS MS: no deformity or atrophy  Skin: warm and dry, no rash Neuro:  Strength and sensation are intact Psych: euthymic mood, full affect   EKG:  EKG is ordered today. The ekg ordered today demonstrates : AV dual-paced rhythm When compared with ECG of 25-Sep-2023 02:02, Premature ventricular complexes are no longer Present Vent. rate has decreased BY  21 BPM    Recent Labs: 08/19/2023: ALT 14; TSH 0.748 09/24/2023: B Natriuretic Peptide 2,019.9 09/29/2023: BUN 47; Creatinine, Ser 0.97; Magnesium 2.4; Potassium 4.9; Sodium 142 09/30/2023: Hemoglobin 10.7; Platelets 286    Lipid Panel    Component Value Date/Time   CHOL 164 04/09/2023 0925   TRIG 92 04/09/2023 0925   HDL 61 04/09/2023 0925   CHOLHDL 2.7 04/09/2023 0925   LDLCALC 86 04/09/2023 0925      Wt Readings from Last 3 Encounters:  11/20/23 106 lb 6.4 oz (48.3 kg)  10/22/23 103 lb (46.7 kg)  09/24/23 106 lb 7.7 oz (48.3 kg)          04/05/2016   10:08 AM  PAD Screen  Previous PAD dx? No  Previous surgical procedure? No  Pain with walking? No  Feet/toe relief with dangling? No  Painful, non-healing ulcers? No  Extremities discolored? No      ASSESSMENT AND PLAN:   1.  Acute on chronic systolic heart failure: She appears to be mildly volume overloaded and has not responded to increased dose of furosemide to 40 mg daily.  I elected to switch to torsemide 20 mg daily.  In addition, her blood pressure is running on the low side and thus I decreased Entresto to 24/26 mg twice daily.  Check labs in 1 week and follow-up in a month.  2. Status  post mitral valve replacement with a bioprosthetic valve: This was functioning normally on most recent echocardiogram.  3. Coronary artery disease status post one-vessel LIMA to LAD: No anginal symptoms  4. Persistent atrial fibrillation: She seems to be maintaining in sinus rhythm with amiodarone.  Continue long-term anticoagulation with warfarin.    5.  Hyperlipidemia: Currently on simvastatin.  She did not tolerate rosuvastatin due to myalgia.  6.  Status post permanent pacemaker placement: Followed by Dr. Graciela Husbands.      Disposition: Labs in 1 week and follow-up in 1 month.  Signed,  Lorine Bears, MD  11/20/2023 9:36 AM    [facility]

## 2023-11-24 ENCOUNTER — Inpatient Hospital Stay: Payer: Self-pay | Admitting: Internal Medicine

## 2023-11-26 ENCOUNTER — Ambulatory Visit: Attending: Cardiovascular Disease

## 2023-11-26 DIAGNOSIS — Z5181 Encounter for therapeutic drug level monitoring: Secondary | ICD-10-CM | POA: Insufficient documentation

## 2023-11-26 DIAGNOSIS — I340 Nonrheumatic mitral (valve) insufficiency: Secondary | ICD-10-CM | POA: Insufficient documentation

## 2023-11-26 DIAGNOSIS — Z953 Presence of xenogenic heart valve: Secondary | ICD-10-CM | POA: Insufficient documentation

## 2023-11-26 LAB — POCT INR: INR: 3.8 — AB (ref 2.0–3.0)

## 2023-11-26 NOTE — Patient Instructions (Signed)
 Hold today only then decrease to 0.5 tablet daily except 1 tablet on Tuesdays and Thursdays - Recheck INR in 3 weeks. [phone]

## 2023-11-27 ENCOUNTER — Ambulatory Visit: Payer: Medicare Other | Admitting: Emergency Medicine

## 2023-11-27 VITALS — Ht 61.0 in | Wt 103.0 lb

## 2023-11-27 DIAGNOSIS — Z0000 Encounter for general adult medical examination without abnormal findings: Secondary | ICD-10-CM

## 2023-11-27 NOTE — Progress Notes (Signed)
 Subjective:   [name] is a 89 y.o. who presents for a Medicare Wellness preventive visit.  Visit Complete: Virtual I connected with  ABILENE MCPHEE on 11/27/23 by a audio enabled telemedicine application and verified that I am speaking with the correct person using two identifiers.  Patient Location: Home  Provider Location: Home Office  I discussed the limitations of evaluation and management by telemedicine. The patient expressed understanding and agreed to proceed.  Vital Signs: Because this visit was a virtual/telehealth visit, some criteria may be missing or patient reported. Any vitals not documented were not able to be obtained and vitals that have been documented are patient reported.  VideoDeclined- This patient declined Librarian, academic. Therefore the visit was completed with audio only.  Persons Participating in Visit: Patient.  AWV Questionnaire: No: Patient Medicare AWV questionnaire was not completed prior to this visit.  Cardiac Risk Factors include: advanced age (>40men, >76 women);hypertension;dyslipidemia;Other (see comment), Risk factor comments: CAD, prediabetic     Objective:    Today's Vitals   11/27/23 1119  Weight: 103 lb (46.7 kg)  Height: 5\' 1"  (1.549 m)   Body mass index is 19.46 kg/m.     11/27/2023   11:35 AM 09/29/2023   12:27 PM 09/24/2023    7:22 PM 10/04/2022    8:28 AM 10/01/2021    8:21 AM 06/07/2019    6:27 AM 02/22/2019    9:00 PM  Advanced Directives  Does Patient Have a Medical Advance Directive? Yes  No Yes Yes No No  Type of Estate agent of [place];Living will   Healthcare Power of [place];Living will Healthcare Power of [place];Living will    Does patient want to make changes to medical advance directive? No - Patient declined   No - Patient declined   No - Patient declined  Copy of Healthcare Power of Attorney in Chart? No - copy requested   No - copy requested No - copy  requested    Would patient like information on creating a medical advance directive?  No - Patient declined    No - Patient declined     Current Medications (verified) Outpatient Encounter Medications as of 11/27/2023  Medication Sig   acetaminophen (TYLENOL) 500 MG tablet Take 1,000 mg by mouth every 6 (six) hours as needed (for pain.).   amiodarone (PACERONE) 200 MG tablet Take 1 tablet (200 mg) 5 days per week   bisoprolol (ZEBETA) 5 MG tablet TAKE 1/2 TABLET BY MOUTH DAILY   Cyanocobalamin (VITAMIN B-12 PO) Take 1,000 mcg by mouth daily.   diphenhydrAMINE (BENADRYL) 25 mg capsule Take 25 mg by mouth every 6 (six) hours as needed for allergies.   docusate sodium (COLACE) 100 MG capsule Take 100 mg by mouth 2 (two) times daily as needed (constipation.).   Ferrous Sulfate (IRON PO) Take 1 tablet by mouth daily.   gabapentin (NEURONTIN) 100 MG capsule Take 2 capsules (200 mg total) by mouth at bedtime.   ipratropium-albuterol (DUONEB) 0.5-2.5 (3) MG/3ML SOLN Inhale 3 mLs into the lungs every 6 (six) hours as needed.   levothyroxine (SYNTHROID) 88 MCG tablet TAKE 1 TABLET BY MOUTH EVERY DAY BEFORE BREAKFAST   Multiple Vitamins-Minerals (PRESERVISION AREDS 2 PO) Take 1 tablet by mouth 2 (two) times daily.    OXYGEN Inhale 2 L into the lungs continuous.   pantoprazole (PROTONIX) 40 MG tablet TAKE 1 TABLET BY MOUTH TWICE A DAY   sacubitril-valsartan (ENTRESTO) 24-26 MG Take  1 tablet by mouth 2 (two) times daily.   simvastatin (ZOCOR) 10 MG tablet TAKE 1 TABLET BY MOUTH EVERYDAY AT BEDTIME   spironolactone (ALDACTONE) 25 MG tablet TAKE 1 TABLET BY MOUTH EVERY DAY   torsemide (DEMADEX) 20 MG tablet Take 1 tablet (20 mg total) by mouth daily.   warfarin (COUMADIN) 2.5 MG tablet TAKE 1/2 A TABLET TO 1 TABLET BY MOUTH DAILY AS DIRECTED BY THE COUMADIN CLINIC.   feeding supplement (ENSURE ENLIVE / ENSURE PLUS) LIQD Take 237 mLs by mouth 2 (two) times daily between meals. (Patient not taking: Reported  on 11/27/2023)   guaiFENesin-dextromethorphan (ROBITUSSIN DM) 100-10 MG/5ML syrup Take 10 mLs by mouth every 6 (six) hours as needed for cough. (Patient not taking: Reported on 11/27/2023)   No facility-administered encounter medications on file as of 11/27/2023.    Allergies (verified) Ace inhibitors, Amoxicillin-pot clavulanate, Penicillins, Rosuvastatin, Nifedipine, Propranolol, Diazepam, Meperidine, and Propoxyphene   History: Past Medical History:  Diagnosis Date   Acute on chronic respiratory failure with hypoxia (HCC) 02/04/2019   Added automatically from request for surgery [id number]   Acute on chronic systolic heart failure (HCC)    Anxiety    Arthritis    "hands" (11/10/2015)   Breast cancer (HCC) 2002   right   CAD s/p CABG x 1    a. 04/2015 LIM[place] to LAD   Chronic combined systolic (congestive) and diastolic (congestive) heart failure (HCC)    a. 01/2015 Echo: EF 50-55%, Gr2 DD (preop valve surgery); b. 08/2015 Echo: EF 30-35%, diff HK. Gr2 DD; c. 09/2016 Echo: EF 50-55%; d. 07/2018 Echo: EF 20-25%; e. 03/2019 Echo: EF 35-40%, diff HK. Mod red RV fxn, RVSP 34.38mmHg. Mildly dil LA. Nl MV prosthesis. Triv AI.   COPD (chronic obstructive pulmonary disease) (HCC)    Coronary artery disease    GERD (gastroesophageal reflux disease)    History of colon polyps    History of mitral valve replacement    a. 04/2015 s/p 27 mm [facility] Mitral bovine bioprosthetic tissue valve   Hyperlipidemia    Hypertension    Hypothyroidism    Influenza A 09/25/2023   Maze operation for AF w/ LAA clipping    a. 04/2015 Complete bilateral atrial lesion set using cryothermy and bipolar radiofrequency ablation with clipping of LA appendage   Meniere disease    Meniere's disease    On home oxygen therapy    "2L at night" (11/10/2015)   PAF (paroxysmal atrial fibrillation) (HCC)    Personal history of radiation therapy    Pneumonia ~ 2010   PONV (postoperative nausea and vomiting)    Pt felt like eardrums  were gonna pop   Post-surgical complete heart block, symptomatic    a. 04/2015 s/p MDT [id number] [address]vis[address] DR MRI DC PPM.   Presence of permanent cardiac pacemaker    Pulmonary hypertension (HCC)    Respiratory failure (HCC) 02/22/2019   Surgical wound, non healing - chest wall 11/10/2015   Tricuspid Regurgitation s/p Repair    a. 04/2015 s/p 26 mm Edwards mc3 ring annuloplasty   Wound infection 10/13/2015   Superficial sternal wound infection   Past Surgical History:  Procedure Laterality Date   APPENDECTOMY     APPLICATION OF A-CELL OF CHEST/ABDOMEN N/A 11/21/2015   Procedure: APPLICATION OF A-CELL OF CHEST/ABDOMEN;  Surgeon: Alena Bills Dillingham, DO;  [facility]: [facility];  Service: Plastics;  Laterality: N/A;   APPLICATION OF A-CELL OF CHEST/ABDOMEN Right 12/21/2015   Procedure: APPLICATION OF A-CELL  OF RIGHT CHEST;  Surgeon: Alena Bills Dillingham, DO;  [facility];  Service: Plastics;  Laterality: Right;   APPLICATION OF A-CELL OF CHEST/ABDOMEN Right 01/11/2016   Procedure: APPLICATION OF A-CELL OF RIGHT CHEST;  Surgeon: Alena Bills Dillingham, DO;  [facility]: [facility];  Service: Plastics;  Laterality: Right;   APPLICATION OF A-CELL OF CHEST/ABDOMEN Right 02/12/2016   Procedure: APPLICATION OF A-CELL TO RIGHT CHEST WOUND;  Surgeon: Alena Bills Dillingham, DO;  [facility];  Service: Plastics;  Laterality: Right;   APPLICATION OF WOUND VAC N/A 05/09/2015   Procedure: APPLICATION OF WOUND VAC;  Surgeon: Purcell Nails, MD;  [facility];  Service: Thoracic;  Laterality: N/A;   APPLICATION OF WOUND VAC N/A 11/13/2015   Procedure: APPLICATION OF WOUND VAC;  Surgeon: Purcell Nails, MD;  [facility];  Service: Thoracic;  Laterality: N/A;   APPLICATION OF WOUND VAC N/A 11/21/2015   Procedure: APPLICATION OF WOUND VAC;  Surgeon: Alena Bills Dillingham, DO;  [facility];  Service: Plastics;  Laterality: N/A;   APPLICATION OF WOUND VAC Right 12/21/2015   Procedure: APPLICATION OF WOUND VAC to right chest wall  ;  Surgeon: Alena Bills Dillingham, DO;  [facility];  Service: Plastics;  Laterality: Right;   APPLICATION OF WOUND VAC Right 01/11/2016   Procedure: APPLICATION OF WOUND VAC RIGHT CHEST ;  Surgeon: Alena Bills Dillingham, DO;  [facility];  Service: Plastics;  Laterality: Right;   APPLICATION OF WOUND VAC Right 01/24/2016   Procedure: APPLICATION OF WOUND VAC RIGHT CHEST WALL;  Surgeon: Alena Bills Dillingham, DO;  [facility];  Service: Plastics;  Laterality: Right;   APPLICATION OF WOUND VAC Right 02/12/2016   Procedure: RE-APPLICATION OF WOUND VAC TO RIGHT CHEST WOUND;  Surgeon: Alena Bills Dillingham, DO;  [facility];  Service: Plastics;  Laterality: Right;   BIV UPGRADE N/A 06/07/2019   Procedure: BIV UPGRADE;  Surgeon: Duke Salvia, MD;  Location: [facility] INVASIVE CV LAB;  Service: Cardiovascular;  Laterality: N/A;   BREAST BIOPSY Left 11/25/06   neg   BREAST BIOPSY Left 01/20/12   /clip-neg   CARDIAC CATHETERIZATION  11/2013   [facility]   CARDIAC CATHETERIZATION  10/2014   [facility]   CARDIAC VALVE REPLACEMENT     CARDIOVERSION N/A 07/20/2018   Procedure: CARDIOVERSION;  Surgeon: Iran Ouch, MD;  Location: [facility] ORS;  Service: Cardiovascular;  Laterality: N/A;   CARDIOVERSION N/A 09/18/2020   Procedure: CARDIOVERSION;  Surgeon: Antonieta Iba, MD;  Location: [facility] ORS;  Service: Cardiovascular;  Laterality: N/A;   CATARACT EXTRACTION W/ INTRAOCULAR LENS  IMPLANT, BILATERAL Bilateral 2014   CLIPPING OF ATRIAL APPENDAGE N/A 04/25/2015   Procedure: CLIPPING OF ATRIAL APPENDAGE;  Surgeon: Purcell Nails, MD;  [facility];  Service: Open Heart Surgery;  Laterality: N/A;   COCHLEAR IMPLANT Left 2005?   COLONOSCOPY WITH PROPOFOL N/A 01/20/2018   Procedure: COLONOSCOPY WITH PROPOFOL;  Surgeon: Toledo, Boykin Nearing, MD;  Location: [facility] ENDOSCOPY;  Service: Gastroenterology;  Laterality: N/A;   CORONARY ANGIOPLASTY     CORONARY ARTERY BYPASS GRAFT N/A 04/25/2015   Procedure: CORONARY ARTERY BYPASS  GRAFTING (CABG) x ONE, using left internal mammary artery;  Surgeon: Purcell Nails, MD;  [facility];  Service: Open Heart Surgery;  Laterality: N/A;   DILATION AND CURETTAGE OF UTERUS  "several before hysterectomy"   EP IMPLANTABLE DEVICE N/A 05/02/2015   Procedure: Pacemaker Implant;  Surgeon: Hillis Range, MD;  [facility];  Service: Cardiovascular;  Laterality: N/A;   ESOPHAGOGASTRODUODENOSCOPY (EGD) WITH PROPOFOL N/A 01/20/2018   Procedure: ESOPHAGOGASTRODUODENOSCOPY (EGD) WITH PROPOFOL;  Surgeon: Toledo, Boykin Nearing, MD;  Location: [facility] ENDOSCOPY;  Service: Gastroenterology;  Laterality: N/A;   EYE SURGERY     I & D EXTREMITY Right 12/06/2015   Procedure: IRRIGATION AND DEBRIDEMENT RIGHT CHEST WALL WITH ACELL PLACEMENT AND VAC;  Surgeon: Alena Bills Dillingham, DO;  [facility];  Service: Plastics;  Laterality: Right;   INCISION AND DRAINAGE OF WOUND N/A 11/21/2015   Procedure: IRRIGATION AND DEBRIDEMENT WOUND;  Surgeon: Alena Bills Dillingham, DO;  [facility];  Service: Plastics;  Laterality: N/A;   INCISION AND DRAINAGE OF WOUND Right 12/21/2015   Procedure: IRRIGATION AND DEBRIDEMENT right chest wall WOUND;  Surgeon: Alena Bills Dillingham, DO;  [facility];  Service: Plastics;  Laterality: Right;  right chest wall    INCISION AND DRAINAGE OF WOUND Right 01/24/2016   Procedure: IRRIGATION AND DEBRIDEMENT RIGHT CHEST WALL WOUND;  Surgeon: Alena Bills Dillingham, DO;  [facility];  Service: Plastics;  Laterality: Right;   INCISION AND DRAINAGE OF WOUND Right 03/28/2016   Procedure: IRRIGATION AND DEBRIDEMENT RIGHT CHEST WALL WOUND WITH A Cell Placement;  Surgeon: Alena Bills Dillingham, DO;  [facility];  Service: Plastics;  Laterality: Right;   INCISION AND DRAINAGE OF WOUND Right 05/17/2016   Procedure: IRRIGATION AND DEBRIDEMENT OF RIGHT CHEST WOUND WITH A CELL PLACEMENT;  Surgeon: Alena Bills Dillingham, DO;  [facility];  Service: Plastics;  Laterality: Right;    INSERT / REPLACE / REMOVE PACEMAKER     IRRIGATION AND DEBRIDEMENT OF WOUND WITH SPLIT THICKNESS SKIN GRAFT Right 01/11/2016   Procedure: IRRIGATION AND DEBRIDEMENT OF RIGHT CHEST WOUND ;  Surgeon: Alena Bills Dillingham, DO;  [facility];  Service: Plastics;  Laterality: Right;   MASTECTOMY, PARTIAL Right 2002   positive, partial   MAZE N/A 04/25/2015   Procedure: MAZE;  Surgeon: Purcell Nails, MD;  Location: [facility] OR;  Service: Open Heart Surgery;  Laterality: N/A;   MITRAL VALVE REPAIR N/A 04/25/2015   Procedure: MITRAL VALVE  REPLACEMENT using a 27 mm Edwards Perimount Magna Mitral Ease Valve;  Surgeon: Purcell Nails, MD;  [facility];  Service: Open Heart Surgery;  Laterality: N/A;   RIGHT HEART CATH N/A 02/08/2019   Procedure: RIGHT HEART CATH;  Surgeon: Iran Ouch, MD;  Location: [facility] INVASIVE CV LAB;  Service: Cardiovascular;  Laterality: N/A;   SKIN SPLIT GRAFT Right 02/12/2016   Procedure: IRRIGATION AND DEBRIDEMENT RIGHT CHEST WOUND;  Surgeon: Alena Bills Dillingham, DO;  [facility];  Service: Plastics;  Laterality: Right;   STERNAL WIRES REMOVAL N/A 06/05/2015   Procedure: STERNAL WIRES REMOVAL;  Surgeon: Purcell Nails, MD;  [facility];  Service: Thoracic;  Laterality: N/A;   STERNAL WOUND DEBRIDEMENT N/A 05/09/2015   Procedure: STERNAL WOUND DEBRIDEMENT;  Surgeon: Purcell Nails, MD;  [facility];  Service: Thoracic;  Laterality: N/A;   STERNAL WOUND DEBRIDEMENT N/A 11/13/2015   Procedure: Excisional drainage of RIGHT Chest wall mass and breast mass ;  Surgeon: Purcell Nails, MD;  [facility];  Service: Thoracic;  Laterality: N/A;   TEE WITHOUT CARDIOVERSION N/A 04/25/2015   Procedure: TRANSESOPHAGEAL ECHOCARDIOGRAM (TEE);  Surgeon: Purcell Nails, MD;  Location: [facility] OR;  Service: Open Heart Surgery;  Laterality: N/A;   TONSILLECTOMY     TRAM Right  11/18/2015   Procedure: VRAM Vertical Rectus Abdominus Muscle Flap;  Surgeon: Alena Bills Dillingham, DO;  [facility];  Service: Plastics;  Laterality: Right;  RIght Back   TRICUSPID VALVE REPLACEMENT N/A 04/25/2015   Procedure: TRICUSPID VALVE REPAIR;  Surgeon: Purcell Nails, MD;  [facility];  Service: Open Heart Surgery;  Laterality: N/A;   VAGINAL HYSTERECTOMY     Family History  Problem Relation Age of Onset   Heart disease Father    Heart disease Brother    CVA Mother    Heart attack Paternal Uncle    Breast cancer Maternal Aunt    Breast cancer Paternal Aunt    Social History   Socioeconomic History   Marital status: Married    Spouse name: Kelsay Haggard   Number of children: 0   Years of education: Not on file   Highest education level: Not on file  Occupational History   Occupation: Retired  Tobacco Use   Smoking status: Former    Current packs/day: 0.00    Average packs/day: 1.5 packs/day for 40.0 years (60.0 ttl pk-yrs)    Types: Cigarettes    Start date: 04/20/1958    Quit date: 04/20/1998    Years since quitting: 25.6   Smokeless tobacco: Never  Vaping Use   Vaping status: Never Used  Substance and Sexual Activity   Alcohol use: Not Currently    Comment: 11/10/2015 "glass of wine a few times/year, if that"   Drug use: No   Sexual activity: Yes  Other Topics Concern   Not on file  Social History Narrative   Not on file   Social Drivers of Health   Financial Resource Strain: Low Risk  (11/27/2023)   Overall Financial Resource Strain (CARDIA)    Difficulty of Paying Living Expenses: Not hard at all  Food Insecurity: No Food Insecurity (11/27/2023)   Hunger Vital Sign    Worried About Running Out of Food in the Last Year: Never true    Ran Out of Food in the Last Year: Never true  Transportation Needs: No Transportation Needs (11/27/2023)   PRAPARE - Administrator, Civil Service (Medical): No    Lack of Transportation (Non-Medical): No  Physical Activity: Insufficiently Active (11/27/2023)   Exercise Vital Sign    Days of Exercise per Week: 2 days     Minutes of Exercise per Session: 30 min  Stress: No Stress Concern Present (11/27/2023)   [employer] of Occupational Health - Occupational Stress Questionnaire    Feeling of Stress : Not at all  Social Connections: Moderately Isolated (11/27/2023)   Social Connection and Isolation Panel [NHANES]    Frequency of Communication with Friends and Family: More than three times a week    Frequency of Social Gatherings with Friends and Family: Three times a week    Attends Religious Services: Never    Active Member of Clubs or Organizations: No    Attends Banker Meetings: Never    Marital Status: Married    Tobacco Counseling Counseling given: Not Answered    Clinical Intake:  Pre-visit preparation completed: Yes  Pain : No/denies pain     BMI - recorded: 19.46 Nutritional Status: BMI of 19-24  Normal Nutritional Risks: None Diabetes: No  Lab Results  Component Value Date   HGBA1C 5.4 08/21/2022   HGBA1C 5.7 (H) 02/19/2022   HGBA1C 5.9 (H) 08/21/2021     How often do you need to have someone  help you when you read instructions, pamphlets, or other written materials from your doctor or pharmacy?: 3 - Sometimes (has Macular Degeneration)  Interpreter Needed?: No  Information entered by :: Tora Kindred, CMA   Activities of Daily Living     11/27/2023   11:23 AM 09/29/2023   12:00 AM  In your present state of health, do you have any difficulty performing the following activities:  Hearing? 1   Comment Cochlear implant   Vision? 1   Comment Macular Degeneration   Difficulty concentrating or making decisions? 0   Walking or climbing stairs? 1   Comment standard walker (inside), rollator (outside of home)   Dressing or bathing? 0   Doing errands, shopping? 1 0  Comment doesn't drive, husband takes to appointments   Preparing Food and eating ? N   Using the Toilet? N   In the past six months, have you accidently leaked urine? Y   Comment wears  panty liner   Do you have problems with loss of bowel control? N   Managing your Medications? N   Managing your Finances? N   Housekeeping or managing your Housekeeping? N     Patient Care Team: Reubin Milan, MD as PCP - General (Internal Medicine) Iran Ouch, MD as PCP - Cardiology (Cardiology) Duke Salvia, MD as PCP - Electrophysiology (Cardiology) Delma Freeze, FNP as Nurse Practitioner (Family Medicine) Erin Fulling, MD as Consulting Physician (Pulmonary Disease) Lockie Mola, MD as Referring Physician (Ophthalmology) Nicholaus Corolla, MD as Referring Physician (Ophthalmology)  Indicate any recent Medical Services you may have received from other than [facility] providers in the past year (date may be approximate).     Assessment:   This is a routine wellness examination for Andalyn.  Hearing/Vision screen Hearing Screening - Comments:: Cochlear implant Vision Screening - Comments:: Macular Degeneration, gets eye exams, Dr. Inez Pilgrim @ [facility]   Goals Addressed             This Visit's Progress    Patient Stated       "Get stronger"       Depression Screen     11/27/2023   11:33 AM 04/09/2023    8:31 AM 01/24/2023    3:33 PM 12/10/2022   11:07 AM 11/15/2022    8:02 AM 10/04/2022    8:12 AM 08/21/2022    7:56 AM  PHQ 2/9 Scores  PHQ - 2 Score 0 0 0 0 0 0 0  PHQ- 9 Score 0 0 0 0 0  1    Fall Risk     11/27/2023   11:37 AM 04/09/2023    8:31 AM 01/24/2023    3:33 PM 12/10/2022   11:07 AM 08/21/2022    7:57 AM  Fall Risk   Falls in the past year? 0 0 0 0 0  Number falls in past yr: 0 0 0 0 0  Injury with Fall? 0 0 0 0 0  Risk for fall due to : Impaired balance/gait;Impaired mobility;Impaired vision Impaired balance/gait No Fall Risks No Fall Risks No Fall Risks  Follow up Falls prevention discussed;Falls evaluation completed;Education provided Falls evaluation completed Falls evaluation completed Falls evaluation completed Falls  evaluation completed    MEDICARE RISK AT HOME:  Medicare Risk at Home Any stairs in or around the home?: Yes If so, are there any without handrails?: No Home free of loose throw rugs in walkways, pet beds, electrical cords, etc?: No Adequate lighting in  your home to reduce risk of falls?: Yes Life alert?: Yes Use of a cane, walker or w/c?: Yes (rollator) Grab bars in the bathroom?: Yes Shower chair or bench in shower?: Yes Elevated toilet seat or a handicapped toilet?: Yes  TIMED UP AND GO:  Was the test performed?  No  Cognitive Function: 6CIT completed        11/27/2023   11:38 AM 10/04/2022    8:15 AM  6CIT Screen  What Year? 0 points 0 points  What month? 0 points 0 points  What time? 0 points 0 points  Count back from 20 0 points 0 points  Months in reverse 2 points 0 points  Repeat phrase 0 points 0 points  Total Score 2 points 0 points    Immunizations Immunization History  Administered Date(s) Administered   Fluad Quad(high Dose 65+) 05/22/2021, 08/05/2022   Influenza, High Dose Seasonal PF 06/27/2015, 06/12/2016, 06/10/2017, 04/30/2018, 06/23/2020, 06/26/2023   Influenza, Seasonal, Injecte, Preservative Fre 06/21/2015   Influenza-Unspecified 06/20/2012, 06/16/2013, 06/28/2014   PFIZER Comirnaty(Gray Top)Covid-19 Tri-Sucrose Vaccine 05/23/2020   PFIZER(Purple Top)SARS-COV-2 Vaccination 09/16/2019, 10/07/2019   PNEUMOCOCCAL CONJUGATE-20 08/06/2022   Pfizer Covid-19 Vaccine Bivalent Booster 22yrs & up 07/05/2021   Pfizer(Comirnaty)Fall Seasonal Vaccine 12 years and older 06/26/2023   Pneumococcal Conjugate-13 07/22/2016   Pneumococcal Polysaccharide-23 09/09/1992, 09/25/2005, 09/19/2014   Respiratory Syncytial Virus Vaccine,Recomb Aduvanted(Arexvy) 08/06/2022   Tdap 07/18/2017   Unspecified SARS-COV-2 Vaccination 08/05/2022   Zoster Recombinant(Shingrix) 04/02/2018, 07/14/2018    Screening Tests Health Maintenance  Topic Date Due   MAMMOGRAM  09/25/2022    COVID-19 Vaccine (7 - Pfizer risk 2024-25 season) 12/25/2023   Medicare Annual Wellness (AWV)  11/26/2024   DTaP/Tdap/Td (2 - Td or Tdap) 07/19/2027   Pneumonia Vaccine 15+ Years old  Completed   INFLUENZA VACCINE  Completed   DEXA SCAN  Completed   Zoster Vaccines- Shingrix  Completed   HPV VACCINES  Aged Out    Health Maintenance  Health Maintenance Due  Topic Date Due   MAMMOGRAM  09/25/2022   Health Maintenance Items Addressed: See Nurse Notes  Additional Screening:  Vision Screening: Recommended annual ophthalmology exams for early detection of glaucoma and other disorders of the eye.  Dental Screening: Recommended annual dental exams for proper oral hygiene  Community Resource Referral / Chronic Care Management: CRR required this visit?  No   CCM required this visit?  No     Plan:     I have personally reviewed and noted the following in the patient's chart:   Medical and social history Use of alcohol, tobacco or illicit drugs  Current medications and supplements including opioid prescriptions. Patient is not currently taking opioid prescriptions. Functional ability and status Nutritional status Physical activity Advanced directives List of other physicians Hospitalizations, surgeries, and ER visits in previous 12 months Vitals Screenings to include cognitive, depression, and falls Referrals and appointments  In addition, I have reviewed and discussed with patient certain preventive protocols, quality metrics, and best practice recommendations. A written personalized care plan for preventive services as well as general preventive health recommendations were provided to patient.     Tora Kindred, CMA   11/27/2023   After Visit Summary: (MyChart) Due to this being a telephonic visit, the after visit summary with patients personalized plan was offered to patient via MyChart   Notes:  6 CIT Score - 2 Declined MMG, colonoscopy and DEXA based on age

## 2023-11-27 NOTE — Patient Instructions (Addendum)
 [name] , Thank you for taking time to come for your Medicare Wellness Visit. I appreciate your ongoing commitment to your health goals. Please review the following plan we discussed and let me know if I can assist you in the future.   Referrals/Orders/Follow-Ups/Clinician Recommendations: Keep up the good work!!  This is a list of the screening recommended for you and due dates:  Health Maintenance  Topic Date Due   Mammogram  09/25/2022   COVID-19 Vaccine (7 - Pfizer risk 2024-25 season) 12/25/2023   Medicare Annual Wellness Visit  11/26/2024   DTaP/Tdap/Td vaccine (2 - Td or Tdap) 07/19/2027   Pneumonia Vaccine  Completed   Flu Shot  Completed   DEXA scan (bone density measurement)  Completed   Zoster (Shingles) Vaccine  Completed   HPV Vaccine  Aged Out    Advanced directives: (Copy Requested) Please bring a copy of your health care power of attorney and living will to the office to be added to your chart at your convenience. You can mail to [facility] [address] [place], [place] [address] or email to [email or web address]  Next Medicare Annual Wellness Visit scheduled for next year: Yes, 12/09/24 @ 10:40am (phone visit)  Fall Prevention in the Home, Adult Falls can cause injuries and affect people of all ages. There are many simple things that you can do to make your home safe and to help prevent falls. If you need it, ask for help making these changes. What actions can I take to prevent falls? General information Use good lighting in all rooms. Make sure to: Replace any light bulbs that burn out. Turn on lights if it is dark and use night-lights. Keep items that you use often in easy-to-reach places. Lower the shelves around your home if needed. Move furniture so that there are clear paths around it. Do not keep throw rugs or other things on the floor that can make you trip. If any of your floors are uneven, fix them. Add color or contrast paint  or tape to clearly mark and help you see: Grab bars or handrails. First and last steps of staircases. Where the edge of each step is. If you use a ladder or stepladder: Make sure that it is fully opened. Do not climb a closed ladder. Make sure the sides of the ladder are locked in place. Have someone hold the ladder while you use it. Know where your pets are as you move through your home. What can I do in the bathroom?     Keep the floor dry. Clean up any water that is on the floor right away. Remove soap buildup in the bathtub or shower. Buildup makes bathtubs and showers slippery. Use non-skid mats or decals on the floor of the bathtub or shower. Attach bath mats securely with double-sided, non-slip rug tape. If you need to sit down while you are in the shower, use a non-slip stool. Install grab bars by the toilet and in the bathtub and shower. Do not use towel bars as grab bars. What can I do in the bedroom? Make sure that you have a light by your bed that is easy to reach. Do not use any sheets or blankets on your bed that hang to the floor. Have a firm bench or chair with side arms that you can use for support when you get dressed. What can I do in the kitchen? Clean up any spills right away. If you need to reach  something above you, use a sturdy step stool that has a grab bar. Keep electrical cables out of the way. Do not use floor polish or wax that makes floors slippery. What can I do with my stairs? Do not leave anything on the stairs. Make sure that you have a light switch at the top and the bottom of the stairs. Have them installed if you do not have them. Make sure that there are handrails on both sides of the stairs. Fix handrails that are broken or loose. Make sure that handrails are as long as the staircases. Install non-slip stair treads on all stairs in your home if they do not have carpet. Avoid having throw rugs at the top or bottom of stairs, or secure the rugs  with carpet tape to prevent them from moving. Choose a carpet design that does not hide the edge of steps on the stairs. Make sure that carpet is firmly attached to the stairs. Fix any carpet that is loose or worn. What can I do on the outside of my home? Use bright outdoor lighting. Repair the edges of walkways and driveways and fix any cracks. Clear paths of anything that can make you trip, such as tools or rocks. Add color or contrast paint or tape to clearly mark and help you see high doorway thresholds. Trim any bushes or trees on the main path into your home. Check that handrails are securely fastened and in good repair. Both sides of all steps should have handrails. Install guardrails along the edges of any raised decks or porches. Have leaves, snow, and ice cleared regularly. Use sand, salt, or ice melt on walkways during winter months if you live where there is ice and snow. In the garage, clean up any spills right away, including grease or oil spills. What other actions can I take? Review your medicines with your health care provider. Some medicines can make you confused or feel dizzy. This can increase your chance of falling. Wear closed-toe shoes that fit well and support your feet. Wear shoes that have rubber soles and low heels. Use a cane, walker, scooter, or crutches that help you move around if needed. Talk with your provider about other ways that you can decrease your risk of falls. This may include seeing a physical therapist to learn to do exercises to improve movement and strength. Where to find more information Centers for Disease Control and Prevention, [employer]AD[employer]: TonerPromos.no [employer] on Aging: BaseRingTones.pl National Institute on Aging: BaseRingTones.pl Contact a health care provider if: You are afraid of falling at home. You feel weak, drowsy, or dizzy at home. You fall at home. Get help right away if you: Lose consciousness or have trouble moving after a fall. Have a  fall that causes a head injury. These symptoms may be an emergency. Get help right away. Call 911. Do not wait to see if the symptoms will go away. Do not drive yourself to the hospital. This information is not intended to replace advice given to you by your health care provider. Make sure you discuss any questions you have with your health care provider. Document Revised: 04/29/2022 Document Reviewed: 04/29/2022 Elsevier Patient Education  2024 [employer].

## 2023-11-28 ENCOUNTER — Other Ambulatory Visit: Payer: Self-pay | Admitting: Internal Medicine

## 2023-11-28 DIAGNOSIS — M5432 Sciatica, left side: Secondary | ICD-10-CM

## 2023-11-28 DIAGNOSIS — K219 Gastro-esophageal reflux disease without esophagitis: Secondary | ICD-10-CM

## 2023-12-01 NOTE — Telephone Encounter (Signed)
 Requested Prescriptions  Pending Prescriptions Disp Refills   pantoprazole (PROTONIX) 40 MG tablet [Pharmacy Med Name: PANTOPRAZOLE SOD DR 40 MG TAB] 180 tablet 0    Sig: TAKE 1 TABLET BY MOUTH TWICE A DAY     Gastroenterology: Proton Pump Inhibitors Passed - 12/01/2023  8:49 AM      Passed - Valid encounter within last 12 months    Recent Outpatient Visits           7 months ago Adult hypothyroidism   [place] [facility] at [facility], Nyoka Cowden, MD   10 months ago Non-healing ulcer of lower leg, right, limited to breakdown of skin [facility])   [place] [facility] at [facility], Nyoka Cowden, MD   11 months ago Cellulitis of right lower extremity   [place] [facility] at [facility], Nyoka Cowden, MD   1 year ago Cellulitis of right lower extremity   [place] [facility] at [facility], Nyoka Cowden, MD   1 year ago Essential hypertension   [place] [facility] at [facility], Nyoka Cowden, MD       Future Appointments             In 1 week Judithann Graves Nyoka Cowden, MD [facility] at [facility], [facility]   In 1 month Fransico Michael, Cadence H, PA-C [facility] at [place]   In 3 months Judithann Graves, Nyoka Cowden, MD [facility] at [facility], [facility]   In 4 months Judithann Graves, Nyoka Cowden, MD [facility] at [facility], [facility]             gabapentin (NEURONTIN) 100 MG capsule [Pharmacy Med Name: GABAPENTIN 100 MG CAPSULE] 180 capsule 0    Sig: TAKE 2 CAPSULES BY MOUTH AT BEDTIME.     Neurology: Anticonvulsants - gabapentin Passed - 12/01/2023  8:49 AM      Passed - Cr in normal range and within 360 days    Creatinine, Ser  Date Value Ref Range Status  09/29/2023 0.97 0.44 - 1.00 mg/dL Final         Passed - Completed PHQ-2 or PHQ-9 in the  last 360 days      Passed - Valid encounter within last 12 months    Recent Outpatient Visits           7 months ago Adult hypothyroidism   [place] [facility] at [facility], Nyoka Cowden, MD   10 months ago Non-healing ulcer of lower leg, right, limited to breakdown of skin [facility])   [place] [facility] at [facility], Nyoka Cowden, MD   11 months ago Cellulitis of right lower extremity   [place] [facility] at [facility], Nyoka Cowden, MD   1 year ago Cellulitis of right lower extremity   [place] [facility] at [facility], Nyoka Cowden, MD   1 year ago Essential hypertension   [place] [facility] at [facility], Nyoka Cowden, MD       Future Appointments             In 1 week Judithann Graves Nyoka Cowden, MD [facility]  at [employer], [facility]   In 1 month [place], [employer], PA-C [employer] [facility] at [place]   In 3 months Judithann Graves, Nyoka Cowden, MD [facility] at [facility], [place]   In 4 months Judithann Graves, Nyoka Cowden, MD [facility] at [facility], [facility]

## 2023-12-03 ENCOUNTER — Encounter: Payer: Self-pay | Admitting: *Deleted

## 2023-12-03 LAB — BASIC METABOLIC PANEL
BUN/Creatinine Ratio: 18 (ref 12–28)
BUN: 18 mg/dL (ref 8–27)
CO2: 32 mmol/L — ABNORMAL HIGH (ref 20–29)
Calcium: 9.3 mg/dL (ref 8.7–10.3)
Chloride: 99 mmol/L (ref 96–106)
Creatinine, Ser: 1 mg/dL (ref 0.57–1.00)
Glucose: 129 mg/dL — ABNORMAL HIGH (ref 70–99)
Potassium: 3.7 mmol/L (ref 3.5–5.2)
Sodium: 147 mmol/L — ABNORMAL HIGH (ref 134–144)
eGFR: 55 mL/min/{1.73_m2} — ABNORMAL LOW (ref >59)

## 2023-12-03 LAB — CBC
Hematocrit: 31.8 % — ABNORMAL LOW (ref 34.0–46.6)
Hemoglobin: 10.1 g/dL — ABNORMAL LOW (ref 11.1–15.9)
MCH: 31.7 pg (ref 26.6–33.0)
MCHC: 31.8 g/dL (ref 31.5–35.7)
MCV: 100 fL — ABNORMAL HIGH (ref 79–97)
Platelets: 200 10*3/uL (ref 150–450)
RBC: 3.19 x10E6/uL — ABNORMAL LOW (ref 3.77–5.28)
RDW: 13.5 % (ref 11.7–15.4)
WBC: 6.4 10*3/uL (ref 3.4–10.8)

## 2023-12-04 ENCOUNTER — Ambulatory Visit (INDEPENDENT_AMBULATORY_CARE_PROVIDER_SITE_OTHER): Payer: Medicare Other

## 2023-12-04 DIAGNOSIS — I428 Other cardiomyopathies: Secondary | ICD-10-CM | POA: Diagnosis not present

## 2023-12-04 LAB — CUP PACEART REMOTE DEVICE CHECK
Battery Remaining Longevity: 13 mo
Battery Voltage: 2.89 V
Brady Statistic AP VP Percent: 73.18 %
Brady Statistic AP VS Percent: 0.04 %
Brady Statistic AS VP Percent: 5.43 %
Brady Statistic AS VS Percent: 21.35 %
Brady Statistic RA Percent Paced: 69.78 %
Brady Statistic RV Percent Paced: 74.94 %
Date Time Interrogation Session: 20250326085107
Implantable Lead Connection Status: 753985
Implantable Lead Connection Status: 753985
Implantable Lead Implant Date: 20160823
Implantable Lead Implant Date: 20160823
Implantable Lead Location: 753859
Implantable Lead Location: 753860
Implantable Lead Model: 5076
Implantable Lead Model: 5076
Implantable Pulse Generator Implant Date: 20160823
Lead Channel Impedance Value: 304 Ohm
Lead Channel Impedance Value: 342 Ohm
Lead Channel Impedance Value: 361 Ohm
Lead Channel Impedance Value: 380 Ohm
Lead Channel Pacing Threshold Amplitude: 0.5 V
Lead Channel Pacing Threshold Amplitude: 0.5 V
Lead Channel Pacing Threshold Pulse Width: 0.4 ms
Lead Channel Pacing Threshold Pulse Width: 0.4 ms
Lead Channel Sensing Intrinsic Amplitude: 0.875 mV
Lead Channel Sensing Intrinsic Amplitude: 0.875 mV
Lead Channel Sensing Intrinsic Amplitude: 13.625 mV
Lead Channel Sensing Intrinsic Amplitude: 13.625 mV
Lead Channel Setting Pacing Amplitude: 1.5 V
Lead Channel Setting Pacing Amplitude: 2 V
Lead Channel Setting Pacing Pulse Width: 0.4 ms
Lead Channel Setting Sensing Sensitivity: 2.8 mV
Zone Setting Status: 755011

## 2023-12-09 ENCOUNTER — Ambulatory Visit (INDEPENDENT_AMBULATORY_CARE_PROVIDER_SITE_OTHER): Admitting: Internal Medicine

## 2023-12-09 ENCOUNTER — Encounter: Payer: Self-pay | Admitting: Internal Medicine

## 2023-12-09 VITALS — BP 114/58 | HR 77 | Ht 61.0 in | Wt 104.4 lb

## 2023-12-09 DIAGNOSIS — I5042 Chronic combined systolic (congestive) and diastolic (congestive) heart failure: Secondary | ICD-10-CM

## 2023-12-09 DIAGNOSIS — R636 Underweight: Secondary | ICD-10-CM | POA: Insufficient documentation

## 2023-12-09 DIAGNOSIS — I7409 Other arterial embolism and thrombosis of abdominal aorta: Secondary | ICD-10-CM | POA: Diagnosis not present

## 2023-12-09 DIAGNOSIS — K219 Gastro-esophageal reflux disease without esophagitis: Secondary | ICD-10-CM

## 2023-12-09 DIAGNOSIS — Z953 Presence of xenogenic heart valve: Secondary | ICD-10-CM | POA: Insufficient documentation

## 2023-12-09 DIAGNOSIS — E039 Hypothyroidism, unspecified: Secondary | ICD-10-CM | POA: Diagnosis not present

## 2023-12-09 DIAGNOSIS — N1831 Chronic kidney disease, stage 3a: Secondary | ICD-10-CM

## 2023-12-09 MED ORDER — PANTOPRAZOLE SODIUM 40 MG PO TBEC: 40.0000 mg | DELAYED_RELEASE_TABLET | Freq: Two times a day (BID) | ORAL | 1 refills | Status: DC

## 2023-12-09 MED ORDER — LEVOTHYROXINE SODIUM 88 MCG PO TABS: 88.0000 ug | ORAL_TABLET | Freq: Every day | ORAL | 1 refills | Status: DC

## 2023-12-09 NOTE — Patient Instructions (Signed)
 Drink Ensure or Boost - 1-2 per day as a snack, not a meal replacement.

## 2023-12-09 NOTE — Assessment & Plan Note (Signed)
 Recommend three meals per day and Ensure or Boost twice a day as a snack.

## 2023-12-09 NOTE — Assessment & Plan Note (Signed)
 Supplemented.   Lab Results  Component Value Date   TSH 0.748 08/19/2023

## 2023-12-09 NOTE — Assessment & Plan Note (Signed)
 Reflux symptoms are controlled on pantoprazole daily. Patient denies red flag symptoms - no melena, weight loss, dysphagia.

## 2023-12-09 NOTE — Assessment & Plan Note (Signed)
 Stable continue to monitor

## 2023-12-09 NOTE — Assessment & Plan Note (Signed)
 Followed by Cardiology

## 2023-12-09 NOTE — Progress Notes (Signed)
 Date:  12/09/2023   Name:  [name]   DOB:  1934/10/14   MRN:  [id number]   Chief Complaint: Hypertension  Hypertension This is a chronic problem. Associated symptoms include shortness of breath. Pertinent negatives include no chest pain, headaches or palpitations. Identifiable causes of hypertension include a thyroid problem.  Thyroid Problem Presents for follow-up visit. Patient reports no anxiety, fatigue or palpitations. The symptoms have been stable.  CKD - recent labs showed stable GFR around 50.  She takes Entresto and diuretic as needed. Weight loss - she lost weight while in rehab because the food was not good. She has been trying to eat better since being home.  However, it tends to make her feet/ankles swell.  She has Ensure but has not been drinking it.   Review of Systems  Constitutional:  Negative for chills, fatigue and fever.  HENT:  Negative for trouble swallowing.   Eyes:  Negative for visual disturbance.  Respiratory:  Positive for shortness of breath. Negative for cough, chest tightness and wheezing.   Cardiovascular:  Positive for leg swelling. Negative for chest pain and palpitations.  Gastrointestinal:  Negative for abdominal pain.  Genitourinary:  Negative for frequency and urgency.  Musculoskeletal:  Positive for arthralgias and gait problem.  Skin:  Negative for rash and wound.  Neurological:  Negative for dizziness, light-headedness and headaches.  Psychiatric/Behavioral:  Negative for dysphoric mood and sleep disturbance. The patient is not nervous/anxious.      Lab Results  Component Value Date   NA 147 (H) 12/02/2023   K 3.7 12/02/2023   CO2 32 (H) 12/02/2023   GLUCOSE 129 (H) 12/02/2023   BUN 18 12/02/2023   CREATININE 1.00 12/02/2023   CALCIUM 9.3 12/02/2023   EGFR 55 (L) 12/02/2023   GFRNONAA 57 (L) 09/29/2023   Lab Results  Component Value Date   CHOL 164 04/09/2023   HDL 61 04/09/2023   LDLCALC 86 04/09/2023   TRIG 92 04/09/2023    CHOLHDL 2.7 04/09/2023   Lab Results  Component Value Date   TSH 0.748 08/19/2023   Lab Results  Component Value Date   HGBA1C 5.4 08/21/2022   Lab Results  Component Value Date   WBC 6.4 12/02/2023   HGB 10.1 (L) 12/02/2023   HCT 31.8 (L) 12/02/2023   MCV 100 (H) 12/02/2023   PLT 200 12/02/2023   Lab Results  Component Value Date   ALT 14 08/19/2023   AST 17 08/19/2023   ALKPHOS 61 08/19/2023   BILITOT 0.3 08/19/2023   Lab Results  Component Value Date   VD25OH 44.4 04/09/2023     Patient Active Problem List   Diagnosis Date Noted   S/P heart valve replacement with bioprosthetic valve 12/09/2023   Underweight 12/09/2023   Chronic anemia 09/25/2023   Thrombocytopenia (HCC) 09/25/2023   Anticoagulated on Coumadin 09/25/2023   COPD with acute exacerbation (HCC) 09/25/2023   Elevated troponin 09/25/2023   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 09/25/2023   Status cardiac pacemaker secondary to postoperative complete heart block 09/25/2023   Physical deconditioning/frailty 09/25/2023   Macular degeneration 12/10/2022   Chronic respiratory failure with hypoxia (HCC) 04/08/2022   Gastroesophageal reflux disease 02/19/2022   Sciatica of left side 08/21/2021   Aortoiliac occlusive disease (HCC) 05/22/2021   Cochlear implant in place 05/22/2021   Acquired thrombophilia (HCC) 08/11/2020   Stage 3a chronic kidney disease (CKD) (HCC) 08/11/2020   Primary insomnia 07/31/2019  Osteoporosis, postmenopausal 01/23/2018   Coronary artery disease involving native coronary artery of native heart with angina pectoris (HCC) 01/13/2017   S/P placement of cardiac pacemaker 05/02/15, medtronic 05/03/2015   Bradycardia 05/03/2015   Presence of cardiac pacemaker 05/03/2015   S/P mitral valve replacement with bioprosthetic valve, tricuspid valve repair, maze procedure and CABG x1 04/25/2015   S/P tricuspid valve repair 04/25/2015   Atrial  fibrillation-persistent, with history of maze procedure 04/25/2015   CAD s/p CABGx1 04/25/2015   Pulmonary hypertension (HCC)    Tricuspid regurgitation    Chronic combined systolic and diastolic CHF (congestive heart failure) (HCC)    Chronic systolic heart failure (HCC) 11/10/2014   Essential hypertension 11/10/2014   Mitral regurgitation 11/10/2014   Hyperlipidemia 11/10/2014   Prediabetes 05/08/2014   Auditory vertigo 05/08/2014   Adult hypothyroidism 05/08/2014   H/O adenomatous polyp of colon 05/08/2014   Stage 4 very severe COPD by GOLD classification (HCC) 03/28/2014    Allergies  Allergen Reactions   Ace Inhibitors Cough and Other (See Comments)   Amoxicillin-Pot Clavulanate Swelling and Other (See Comments)    SWOLLEN JOINTS  Has patient had a PCN reaction causing immediate rash, facial/tongue/throat swelling, SOB or lightheadedness with hypotension: Yes Has patient had a PCN reaction causing severe rash involving mucus membranes or skin necrosis: No Has patient had a PCN reaction that required hospitalization No Has patient had a PCN reaction occurring within the last 10 years: No If all of the above answers are "NO", then may proceed with Cephalosporin   Penicillins Other (See Comments) and Swelling    SWOLLEN JOINTS   Has patient had a PCN reaction causing immediate rash, facial/tongue/throat swelling, SOB or lightheadedness with hypotension: Yes  Has patient had a PCN reaction causing severe rash involving mucus membranes or skin necrosis: No  Has patient had a PCN reaction that required hospitalization No  Has patient had a PCN reaction occurring within the last 10 years: No  If all of the above answers are "NO", then may proceed with Cephalosporin use.  Swollen joints  Swollen joints, Has patient had a PCN reaction causing immediate rash, facial/tongue/throat swelling, SOB or lightheadedness with hypotension: Yes, Has patient had a PCN reaction causing severe  rash involving mucus membranes or skin necrosis: No, Has patient had a PCN reaction that required hospitalization No, Has patient had a PCN reaction occurring within the last 10 years: No, If all of the above answers are "NO", then may proceed with Cephalosporin use.   Rosuvastatin Other (See Comments)   Nifedipine Other (See Comments)    UNSPECIFIED   Propranolol Other (See Comments)    UNSPECIFIED   Diazepam Other (See Comments)    Made her "hyper"    Meperidine Other (See Comments)    Made her "hyper"     Propoxyphene Other (See Comments)    Made her "hyper"      Past Surgical History:  Procedure Laterality Date   APPENDECTOMY     APPLICATION OF A-CELL OF CHEST/ABDOMEN N/A 11/21/2015   Procedure: APPLICATION OF A-CELL OF CHEST/ABDOMEN;  Surgeon: Alena Bills Dillingham, DO;  [facility];  Service: Plastics;  Laterality: N/A;   APPLICATION OF A-CELL OF CHEST/ABDOMEN Right 12/21/2015   Procedure: APPLICATION OF A-CELL OF RIGHT CHEST;  Surgeon: Alena Bills Dillingham, DO;  [facility]: [facility];  Service: Plastics;  Laterality: Right;   APPLICATION OF A-CELL OF CHEST/ABDOMEN Right 01/11/2016   Procedure: APPLICATION OF A-CELL OF RIGHT CHEST;  Surgeon: Alena Bills  Dillingham, DO;  [facility]: [facility];  Service: Plastics;  Laterality: Right;   APPLICATION OF A-CELL OF CHEST/ABDOMEN Right 02/12/2016   Procedure: APPLICATION OF A-CELL TO RIGHT CHEST WOUND;  Surgeon: Alena Bills Dillingham, DO;  [facility];  Service: Plastics;  Laterality: Right;   APPLICATION OF WOUND VAC N/A 05/09/2015   Procedure: APPLICATION OF WOUND VAC;  Surgeon: Purcell Nails, MD;  [facility];  Service: Thoracic;  Laterality: N/A;   APPLICATION OF WOUND VAC N/A 11/13/2015   Procedure: APPLICATION OF WOUND VAC;  Surgeon: Purcell Nails, MD;  [facility];  Service: Thoracic;  Laterality: N/A;   APPLICATION OF WOUND VAC N/A 11/21/2015   Procedure: APPLICATION OF WOUND VAC;  Surgeon: Alena Bills Dillingham, DO;  [facility];   Service: Plastics;  Laterality: N/A;   APPLICATION OF WOUND VAC Right 12/21/2015   Procedure: APPLICATION OF WOUND VAC to right chest wall ;  Surgeon: Alena Bills Dillingham, DO;  [facility];  Service: Plastics;  Laterality: Right;   APPLICATION OF WOUND VAC Right 01/11/2016   Procedure: APPLICATION OF WOUND VAC RIGHT CHEST ;  Surgeon: Alena Bills Dillingham, DO;  [facility];  Service: Plastics;  Laterality: Right;   APPLICATION OF WOUND VAC Right 01/24/2016   Procedure: APPLICATION OF WOUND VAC RIGHT CHEST WALL;  Surgeon: Alena Bills Dillingham, DO;  [facility];  Service: Plastics;  Laterality: Right;   APPLICATION OF WOUND VAC Right 02/12/2016   Procedure: RE-APPLICATION OF WOUND VAC TO RIGHT CHEST WOUND;  Surgeon: Alena Bills Dillingham, DO;  [facility];  Service: Plastics;  Laterality: Right;   BIV UPGRADE N/A 06/07/2019   Procedure: BIV UPGRADE;  Surgeon: Duke Salvia, MD;  Location: [facility] INVASIVE CV LAB;  Service: Cardiovascular;  Laterality: N/A;   BREAST BIOPSY Left 11/25/06   neg   BREAST BIOPSY Left 01/20/12   /clip-neg   CARDIAC CATHETERIZATION  11/2013   [facility]   CARDIAC CATHETERIZATION  10/2014   [facility]   CARDIAC VALVE REPLACEMENT     CARDIOVERSION N/A 07/20/2018   Procedure: CARDIOVERSION;  Surgeon: Iran Ouch, MD;  Location: [facility] ORS;  Service: Cardiovascular;  Laterality: N/A;   CARDIOVERSION N/A 09/18/2020   Procedure: CARDIOVERSION;  Surgeon: Antonieta Iba, MD;  Location: [facility] ORS;  Service: Cardiovascular;  Laterality: N/A;   CATARACT EXTRACTION W/ INTRAOCULAR LENS  IMPLANT, BILATERAL Bilateral 2014   CLIPPING OF ATRIAL APPENDAGE N/A 04/25/2015   Procedure: CLIPPING OF ATRIAL APPENDAGE;  Surgeon: Purcell Nails, MD;  [facility];  Service: Open Heart Surgery;  Laterality: N/A;   COCHLEAR IMPLANT Left 2005?   COLONOSCOPY WITH PROPOFOL N/A 01/20/2018   Procedure: COLONOSCOPY WITH PROPOFOL;  Surgeon: Toledo, Boykin Nearing, MD;  Location: [facility] ENDOSCOPY;  Service:  Gastroenterology;  Laterality: N/A;   CORONARY ANGIOPLASTY     CORONARY ARTERY BYPASS GRAFT N/A 04/25/2015   Procedure: CORONARY ARTERY BYPASS GRAFTING (CABG) x ONE, using left internal mammary artery;  Surgeon: Purcell Nails, MD;  [facility];  Service: Open Heart Surgery;  Laterality: N/A;   DILATION AND CURETTAGE OF UTERUS  "several before hysterectomy"   EP IMPLANTABLE DEVICE N/A 05/02/2015   Procedure: Pacemaker Implant;  Surgeon: Hillis Range, MD;  [facility] INV[facility] LAB;  Service: Cardiovascular;  Laterality: N/A;   ESOPHAGOGASTRODUODENOSCOPY (EGD) WITH PROPOFOL N/A 01/20/2018   Procedure: ESOPHAGOGASTRODUODENOSCOPY (EGD) WITH PROPOFOL;  Surgeon: Toledo, Boykin Nearing, MD;  Location: [facility] ENDOSCOPY;  Service: Gastroenterology;  Laterality:  N/A;   EYE SURGERY     I & D EXTREMITY Right 12/06/2015   Procedure: IRRIGATION AND DEBRIDEMENT RIGHT CHEST WALL WITH ACELL PLACEMENT AND VAC;  Surgeon: Alena Bills Dillingham, DO;  [facility];  Service: Plastics;  Laterality: Right;   INCISION AND DRAINAGE OF WOUND N/A 11/21/2015   Procedure: IRRIGATION AND DEBRIDEMENT WOUND;  Surgeon: Alena Bills Dillingham, DO;  [facility];  Service: Plastics;  Laterality: N/A;   INCISION AND DRAINAGE OF WOUND Right 12/21/2015   Procedure: IRRIGATION AND DEBRIDEMENT right chest wall WOUND;  Surgeon: Alena Bills Dillingham, DO;  [facility];  Service: Plastics;  Laterality: Right;  right chest wall    INCISION AND DRAINAGE OF WOUND Right 01/24/2016   Procedure: IRRIGATION AND DEBRIDEMENT RIGHT CHEST WALL WOUND;  Surgeon: Alena Bills Dillingham, DO;  [facility];  Service: Plastics;  Laterality: Right;   INCISION AND DRAINAGE OF WOUND Right 03/28/2016   Procedure: IRRIGATION AND DEBRIDEMENT RIGHT CHEST WALL WOUND WITH A Cell Placement;  Surgeon: Alena Bills Dillingham, DO;  [facility];  Service: Plastics;  Laterality: Right;   INCISION AND DRAINAGE OF WOUND Right 05/17/2016   Procedure: IRRIGATION AND DEBRIDEMENT  OF RIGHT CHEST WOUND WITH A CELL PLACEMENT;  Surgeon: Alena Bills Dillingham, DO;  [facility];  Service: Plastics;  Laterality: Right;   INSERT / REPLACE / REMOVE PACEMAKER     IRRIGATION AND DEBRIDEMENT OF WOUND WITH SPLIT THICKNESS SKIN GRAFT Right 01/11/2016   Procedure: IRRIGATION AND DEBRIDEMENT OF RIGHT CHEST WOUND ;  Surgeon: Alena Bills Dillingham, DO;  [facility];  Service: Plastics;  Laterality: Right;   MASTECTOMY, PARTIAL Right 2002   positive, partial   MAZE N/A 04/25/2015   Procedure: MAZE;  Surgeon: Purcell Nails, MD;  Location: [facility] OR;  Service: Open Heart Surgery;  Laterality: N/A;   MITRAL VALVE REPAIR N/A 04/25/2015   Procedure: MITRAL VALVE  REPLACEMENT using a 27 mm Edwards Perimount Magna Mitral Ease Valve;  Surgeon: Purcell Nails, MD;  [facility];  Service: Open Heart Surgery;  Laterality: N/A;   RIGHT HEART CATH N/A 02/08/2019   Procedure: RIGHT HEART CATH;  Surgeon: Iran Ouch, MD;  Location: [facility] INVASIVE CV LAB;  Service: Cardiovascular;  Laterality: N/A;   SKIN SPLIT GRAFT Right 02/12/2016   Procedure: IRRIGATION AND DEBRIDEMENT RIGHT CHEST WOUND;  Surgeon: Alena Bills Dillingham, DO;  [facility];  Service: Plastics;  Laterality: Right;   STERNAL WIRES REMOVAL N/A 06/05/2015   Procedure: STERNAL WIRES REMOVAL;  Surgeon: Purcell Nails, MD;  [facility];  Service: Thoracic;  Laterality: N/A;   STERNAL WOUND DEBRIDEMENT N/A 05/09/2015   Procedure: STERNAL WOUND DEBRIDEMENT;  Surgeon: Purcell Nails, MD;  [facility];  Service: Thoracic;  Laterality: N/A;   STERNAL WOUND DEBRIDEMENT N/A 11/13/2015   Procedure: Excisional drainage of RIGHT Chest wall mass and breast mass ;  Surgeon: Purcell Nails, MD;  [facility];  Service: Thoracic;  Laterality: N/A;   TEE WITHOUT CARDIOVERSION N/A 04/25/2015   Procedure: TRANSESOPHAGEAL ECHOCARDIOGRAM (TEE);  Surgeon: Purcell Nails, MD;  Location: [facility] OR;  Service: Open Heart Surgery;  Laterality: N/A;    TONSILLECTOMY     TRAM Right 11/18/2015   Procedure: VRAM Vertical Rectus Abdominus Muscle Flap;  Surgeon: Alena Bills Dillingham, DO;  [facility];  Service: Plastics;  Laterality: Right;  RIght Back   TRICUSPID VALVE REPLACEMENT N/A 04/25/2015   Procedure: TRICUSPID  VALVE REPAIR;  Surgeon: Purcell Nails, MD;  Location: [facility] OR;  Service: Open Heart Surgery;  Laterality: N/A;   VAGINAL HYSTERECTOMY      Social History   Tobacco Use   Smoking status: Former    Current packs/day: 0.00    Average packs/day: 1.5 packs/day for 40.0 years (60.0 ttl pk-yrs)    Types: Cigarettes    Start date: 04/20/1958    Quit date: 04/20/1998    Years since quitting: 25.6   Smokeless tobacco: Never  Vaping Use   Vaping status: Never Used  Substance Use Topics   Alcohol use: Not Currently    Comment: 11/10/2015 "glass of wine a few times/year, if that"   Drug use: No     Medication list has been reviewed and updated.  Current Meds  Medication Sig   acetaminophen (TYLENOL) 500 MG tablet Take 1,000 mg by mouth every 6 (six) hours as needed (for pain.).   amiodarone (PACERONE) 200 MG tablet Take 1 tablet (200 mg) 5 days per week   bisoprolol (ZEBETA) 5 MG tablet TAKE 1/2 TABLET BY MOUTH DAILY   Cyanocobalamin (VITAMIN B-12 PO) Take 1,000 mcg by mouth daily.   diphenhydrAMINE (BENADRYL) 25 mg capsule Take 25 mg by mouth every 6 (six) hours as needed for allergies.   docusate sodium (COLACE) 100 MG capsule Take 100 mg by mouth 2 (two) times daily as needed (constipation.).   feeding supplement (ENSURE ENLIVE / ENSURE PLUS) LIQD Take 237 mLs by mouth 2 (two) times daily between meals.   Ferrous Sulfate (IRON PO) Take 1 tablet by mouth daily.   gabapentin (NEURONTIN) 100 MG capsule TAKE 2 CAPSULES BY MOUTH AT BEDTIME.   ipratropium-albuterol (DUONEB) 0.5-2.5 (3) MG/3ML SOLN Inhale 3 mLs into the lungs every 6 (six) hours as needed.   OXYGEN Inhale 2 L into the lungs continuous.   sacubitril-valsartan  (ENTRESTO) 24-26 MG Take 1 tablet by mouth 2 (two) times daily.   simvastatin (ZOCOR) 10 MG tablet TAKE 1 TABLET BY MOUTH EVERYDAY AT BEDTIME   spironolactone (ALDACTONE) 25 MG tablet TAKE 1 TABLET BY MOUTH EVERY DAY   torsemide (DEMADEX) 20 MG tablet Take 1 tablet (20 mg total) by mouth daily.   warfarin (COUMADIN) 2.5 MG tablet TAKE 1/2 A TABLET TO 1 TABLET BY MOUTH DAILY AS DIRECTED BY THE COUMADIN CLINIC.   [DISCONTINUED] levothyroxine (SYNTHROID) 88 MCG tablet TAKE 1 TABLET BY MOUTH EVERY DAY BEFORE BREAKFAST   [DISCONTINUED] pantoprazole (PROTONIX) 40 MG tablet TAKE 1 TABLET BY MOUTH TWICE A DAY       12/09/2023   10:05 AM 04/09/2023    8:31 AM 01/24/2023    3:33 PM 12/10/2022   11:07 AM  GAD 7 : Generalized Anxiety Score  Nervous, Anxious, on Edge 0 0 0 0  Control/stop worrying 0 0 0 0  Worry too much - different things 0 0 0 0  Trouble relaxing 0 0 0 0  Restless 0 0 0 0  Easily annoyed or irritable 0 0 0 0  Afraid - awful might happen 0 0 0 0  Total GAD 7 Score 0 0 0 0  Anxiety Difficulty Not difficult at all Not difficult at all Not difficult at all Not difficult at all       12/09/2023   10:05 AM 11/27/2023   11:33 AM 04/09/2023    8:31 AM  Depression screen PHQ 2/9  Decreased Interest 0 0 0  Down, Depressed, Hopeless 0 0 0  PHQ - 2 Score 0 0 0  Altered sleeping 0 0 0  Tired, decreased energy 1 0 0  Change in appetite 0 0 0  Feeling bad or failure about yourself  0 0 0  Trouble concentrating 0 0 0  Moving slowly or fidgety/restless 0 0 0  Suicidal thoughts 0 0 0  PHQ-9 Score 1 0 0  Difficult doing work/chores Not difficult at all Not difficult at all Not difficult at all    BP Readings from Last 3 Encounters:  12/09/23 (!) 114/58  11/20/23 (!) 92/46  10/22/23 108/60    Physical Exam Vitals and nursing note reviewed.  Constitutional:      General: She is not in acute distress.    Appearance: Normal appearance. She is well-developed.  HENT:     Head:  Normocephalic and atraumatic.  Cardiovascular:     Rate and Rhythm: Normal rate. Rhythm irregular.  Pulmonary:     Effort: Pulmonary effort is normal. No respiratory distress.     Breath sounds: Decreased air movement present. No wheezing or rhonchi.  Abdominal:     Palpations: Abdomen is soft.  Musculoskeletal:     Cervical back: Normal range of motion.     Right lower leg: 1+ Edema present.     Left lower leg: 1+ Edema present.  Lymphadenopathy:     Cervical: No cervical adenopathy.  Skin:    General: Skin is warm and dry.     Capillary Refill: Capillary refill takes less than 2 seconds.     Findings: No rash.  Neurological:     General: No focal deficit present.     Mental Status: She is alert and oriented to person, place, and time.  Psychiatric:        Mood and Affect: Mood normal.        Behavior: Behavior normal.     Wt Readings from Last 3 Encounters:  12/09/23 104 lb 6 oz (47.3 kg)  11/27/23 103 lb (46.7 kg)  11/20/23 106 lb 6.4 oz (48.3 kg)    BP (!) 114/58   Pulse 77   Ht 5\' 1"  (1.549 m)   Wt 104 lb 6 oz (47.3 kg)   SpO2 92% Comment: ON 2 LITERS OF OXYGEN  BMI 19.72 kg/m   Assessment and Plan:  Problem List Items Addressed This Visit       Unprioritized   Chronic combined systolic and diastolic CHF (congestive heart failure) (HCC) (Chronic)   Being followed by Cardiology On Entresto and lasix      Adult hypothyroidism (Chronic)   Supplemented.   Lab Results  Component Value Date   TSH 0.748 08/19/2023         Relevant Medications   levothyroxine (SYNTHROID) 88 MCG tablet   Stage 3a chronic kidney disease (CKD) (HCC) (Chronic)   Stable - continue to monitor.      Aortoiliac occlusive disease (HCC) (Chronic)   Followed by Cardiology.      Gastroesophageal reflux disease (Chronic)   Reflux symptoms are controlled on pantoprazole daily. Patient denies red flag symptoms - no melena, weight loss, dysphagia.       Relevant Medications    pantoprazole (PROTONIX) 40 MG tablet   S/P heart valve replacement with bioprosthetic valve - Primary   Underweight   Recommend three meals per day and Ensure or Boost twice a day as a snack.       No follow-ups on file.    Reubin Milan, MD [facility] [place]

## 2023-12-09 NOTE — Assessment & Plan Note (Signed)
 Being followed by Cardiology On Entresto and lasix

## 2023-12-15 ENCOUNTER — Other Ambulatory Visit (INDEPENDENT_AMBULATORY_CARE_PROVIDER_SITE_OTHER): Admitting: Internal Medicine

## 2023-12-15 DIAGNOSIS — Z953 Presence of xenogenic heart valve: Secondary | ICD-10-CM

## 2023-12-15 DIAGNOSIS — Z950 Presence of cardiac pacemaker: Secondary | ICD-10-CM

## 2023-12-15 DIAGNOSIS — J441 Chronic obstructive pulmonary disease with (acute) exacerbation: Secondary | ICD-10-CM | POA: Diagnosis not present

## 2023-12-15 DIAGNOSIS — I051 Rheumatic mitral insufficiency: Secondary | ICD-10-CM

## 2023-12-15 DIAGNOSIS — E039 Hypothyroidism, unspecified: Secondary | ICD-10-CM | POA: Diagnosis not present

## 2023-12-15 DIAGNOSIS — Z9981 Dependence on supplemental oxygen: Secondary | ICD-10-CM | POA: Insufficient documentation

## 2023-12-15 DIAGNOSIS — R7303 Prediabetes: Secondary | ICD-10-CM

## 2023-12-15 DIAGNOSIS — K219 Gastro-esophageal reflux disease without esophagitis: Secondary | ICD-10-CM

## 2023-12-15 DIAGNOSIS — Z7901 Long term (current) use of anticoagulants: Secondary | ICD-10-CM

## 2023-12-15 DIAGNOSIS — I5042 Chronic combined systolic (congestive) and diastolic (congestive) heart failure: Secondary | ICD-10-CM | POA: Diagnosis not present

## 2023-12-15 DIAGNOSIS — D696 Thrombocytopenia, unspecified: Secondary | ICD-10-CM

## 2023-12-15 DIAGNOSIS — I10 Essential (primary) hypertension: Secondary | ICD-10-CM

## 2023-12-15 DIAGNOSIS — I5022 Chronic systolic (congestive) heart failure: Secondary | ICD-10-CM

## 2023-12-15 DIAGNOSIS — E782 Mixed hyperlipidemia: Secondary | ICD-10-CM

## 2023-12-15 DIAGNOSIS — Z8679 Personal history of other diseases of the circulatory system: Secondary | ICD-10-CM

## 2023-12-15 DIAGNOSIS — I25119 Atherosclerotic heart disease of native coronary artery with unspecified angina pectoris: Secondary | ICD-10-CM

## 2023-12-15 DIAGNOSIS — J9611 Chronic respiratory failure with hypoxia: Secondary | ICD-10-CM

## 2023-12-15 DIAGNOSIS — Z951 Presence of aortocoronary bypass graft: Secondary | ICD-10-CM

## 2023-12-15 NOTE — Progress Notes (Signed)
 Received home health orders orders from [facility]. Start of care 10/31/23.   Certification and orders from 10/31/23 through 12/29/23 are reviewed, signed and faxed back to home health company.  Need of intermittent skilled services at home: homebound  The home health care plan has been established by me and will be reviewed and updated as needed to maximize patient recovery.  I certify that all home health services have been and will be furnished to the patient while under my care.  Face-to-face encounter in which the need for home health services was established: 12/09/23 and at hospital discharge.  Patient is receiving home health services for the following diagnoses: Problem List Items Addressed This Visit       Unprioritized   Chronic systolic heart failure (HCC) (Chronic)   Essential hypertension (Chronic)   Chronic combined systolic and diastolic CHF (congestive heart failure) (HCC) (Chronic)   Coronary artery disease involving native coronary artery of native heart with angina pectoris (HCC) (Chronic)   Prediabetes (Chronic)   Adult hypothyroidism (Chronic)   Gastroesophageal reflux disease (Chronic)   RESOLVED: Mitral regurgitation   Hyperlipidemia   Atrial fibrillation-persistent, with history of maze procedure   CAD s/p CABGx1   Presence of cardiac pacemaker   Chronic respiratory failure with hypoxia (HCC)   Thrombocytopenia (HCC)   Anticoagulated on Coumadin   COPD with acute exacerbation (HCC) - Primary   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   Dependence on supplemental oxygen   Other Visit Diagnoses       S/P mitral valve replacement with bioprosthetic valve, tricuspid valve repair, maze procedure and CABG x1            Bari Edward, MD

## 2023-12-17 ENCOUNTER — Other Ambulatory Visit: Payer: Self-pay | Admitting: Cardiovascular Disease

## 2023-12-17 ENCOUNTER — Ambulatory Visit: Attending: Cardiovascular Disease

## 2023-12-17 DIAGNOSIS — Z953 Presence of xenogenic heart valve: Secondary | ICD-10-CM

## 2023-12-17 DIAGNOSIS — I340 Nonrheumatic mitral (valve) insufficiency: Secondary | ICD-10-CM | POA: Diagnosis present

## 2023-12-17 DIAGNOSIS — Z5181 Encounter for therapeutic drug level monitoring: Secondary | ICD-10-CM | POA: Diagnosis present

## 2023-12-17 LAB — POCT INR: INR: 1.4 — AB (ref 2.0–3.0)

## 2023-12-17 NOTE — Telephone Encounter (Signed)
 Warfarin 2.5mg  refill Afib, S/P mitral valve replacement with bioprosthetic valve  tricuspid valve repair  Last INR 11/26/23 Last OV 11/20/23

## 2023-12-17 NOTE — Telephone Encounter (Signed)
 Refill request

## 2023-12-17 NOTE — Patient Instructions (Signed)
 Take 1 tablet today only then continue 0.5 tablet daily except 1 tablet on Tuesdays and Thursdays - Recheck INR in 3 weeks. [phone]

## 2023-12-27 ENCOUNTER — Encounter: Payer: Self-pay | Admitting: Internal Medicine

## 2024-01-01 ENCOUNTER — Ambulatory Visit: Attending: Medical | Admitting: Medical

## 2024-01-01 ENCOUNTER — Encounter: Payer: Self-pay | Admitting: Medical

## 2024-01-01 VITALS — BP 97/57 | HR 70 | Ht 61.0 in | Wt 102.0 lb

## 2024-01-01 DIAGNOSIS — I5023 Acute on chronic systolic (congestive) heart failure: Secondary | ICD-10-CM | POA: Diagnosis present

## 2024-01-01 DIAGNOSIS — I4819 Other persistent atrial fibrillation: Secondary | ICD-10-CM | POA: Diagnosis present

## 2024-01-01 DIAGNOSIS — Z950 Presence of cardiac pacemaker: Secondary | ICD-10-CM | POA: Diagnosis present

## 2024-01-01 DIAGNOSIS — I2510 Atherosclerotic heart disease of native coronary artery without angina pectoris: Secondary | ICD-10-CM | POA: Diagnosis not present

## 2024-01-01 DIAGNOSIS — Z953 Presence of xenogenic heart valve: Secondary | ICD-10-CM | POA: Diagnosis present

## 2024-01-01 MED ORDER — TORSEMIDE 20 MG PO TABS: ORAL_TABLET | ORAL | 3 refills | Status: DC

## 2024-01-01 NOTE — Progress Notes (Signed)
 Cardiology Office Note:  .   Date:  01/01/2024  ID:  [name], DOB Dec 12, 1934, MRN [id number] PCP: Sheron Dixons, MD  [facility] Providers Cardiologist:  Antionette Kirks, MD Electrophysiologist:  Richardo Chandler, MD {  History of Present Illness: .   [name] is a 89 y.o. 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    She underwent mitral valve replacement with a bioprosthetic valve, tricuspid valve repair, one-vessel CABG with LIMA to LAD and maze procedure in August 2016. This was complicated by sternal wound infection which required debridement. She also had complete heart block and underwent permanent pacemaker placement.   In 08/2017, she had worsening heart failure.  Echocardiogram showed an EF of 30 to 35%. She was seen by EP and it was felt that LV dysfunction was in the context of RV apical pacing. That was inactivated and she noted immediate improvement in symptoms. She had worsening SOB in 2019 and echo was ordered, which showed a drop in EF 20-25% in the setting of afib with RVR. She was rate controlled and started on amiodarone  and subsequently underwent cardioversion with restoration of NSR. EF improved to 35-40% after that.    Right heart catheterization was done in June 2020 which showed moderately elevated filling pressures, severe pulmonary hypertension and mildly reduced cardiac output.  There was no evidence of left-to-right intracardiac shunting by saturation run.  Prominent V waves were noted on pulmonary capillary wedge pressure tracing suggestive of significant mitral regurgitation.  Symptoms improved after increasing furosemide .   She underwent attempted  upgrade of her pacemaker to a biventricular device but the procedure was not successful due to inability to place an LV lead.   She had recurrent atrial fibrillation in January 2021 that required cardioversion.  She has been in sinus rhythm since then.  The patient was last seen 11/20/23 and was felt to be mildly volume up. Lasix  was switched to Torsemide  20mg  daily.   Today, the patient reports she is still having swelling in her ankles. Feels swelling is the same as the last time. She feels she needs an afternoon dose of torsemide . She denies chest pain. Breathing is the same.   Studies Reviewed: Aaron Aas   EKG Interpretation Date/Time:  Thursday January 01 2024 09:13:12 EDT Ventricular Rate:  70 PR Interval:  178 QRS Duration:  196 QT Interval:  520 QTC Calculation: 561 R Axis:   261  Text Interpretation: AV dual-paced rhythm When compared with ECG of 20-Nov-2023 09:33, Vent. rate has increased BY   3 BPM Confirmed by Gennaro Khat, Evalisse Prajapati ([id number]) on 01/01/2024 9:21:09 AM    Echo 06/2023  1. Left ventricular ejection fraction, by estimation, is 30 to 35%. Left  ventricular ejection fraction by 3D volume is 33 %. The left ventricle has  moderately decreased function. Inferior/lateral wall motion best  preserved. The left ventricle  demonstrates global hypokinesis. Left ventricular diastolic parameters are  indeterminate. The average left ventricular global longitudinal strain is  -6.9 %. The global longitudinal strain is abnormal.   2. Right ventricular systolic function is moderately reduced. The right  ventricular size is moderately enlarged. There is moderately elevated  pulmonary artery systolic  pressure. The estimated right ventricular  systolic pressure is 56.2 mmHg.   3. Left atrial size was moderately dilated.   4. The mitral valve has been repaired/replaced. No evidence of mitral  valve regurgitation. No evidence of mitral stenosis. The mean mitral valve  gradient is 6.0 mmHg. S/p Mitral  Valve: 27 mm [employer]  Ease,     bioprosthetic valve   5. The tricuspid valve is has been repaired/replaced. Tricuspid valve  regurgitation is mild to moderate.   6. The aortic valve is tricuspid. Aortic valve regurgitation is mild. No  aortic stenosis is present.   7. The inferior vena cava is dilated in size with >50% respiratory  variability, suggesting right atrial pressure of 8 mmHg.   Echo 2022  1. Left ventricular ejection fraction, by estimation, is 35 to 40%. The  left ventricle has moderately decreased function. The left ventricle  demonstrates global hypokinesis. The left ventricular internal cavity size  was mildly dilated. Left ventricular  diastolic function could not be evaluated.   2. Right ventricular systolic function is severely reduced. The right  ventricular size is normal. There is severely elevated pulmonary artery  systolic pressure.   3. Left atrial size was mild to moderately dilated.   4. The mitral valve has been repaired/replaced. Trivial mitral valve  regurgitation. The mean mitral valve gradient is 5.0 mmHg. There is a 27  mm [facility] Ease bioprosthetic valve present in the mitral  position. Procedure Date: 2016.   5. The tricuspid valve is has been repaired/replaced.   6. The aortic valve has an indeterminant number of cusps. There is mild  thickening of the aortic valve. Aortic valve regurgitation is mild. No  aortic stenosis is present.   7. The inferior vena cava is normal in size with greater than 50%  respiratory variability, suggesting right atrial pressure of 3 mmHg.       Physical Exam:   VS:  BP (!) 97/57   Pulse 70   Ht 5\' 1"  (1.549 m)   Wt 102 lb (46.3 kg)   SpO2 (!) 86% Comment: 3L of O2  BMI 19.27 kg/m    Wt Readings from Last 3 Encounters:  01/01/24 102 lb (46.3 kg)  12/09/23 104 lb 6 oz (47.3 kg)  11/27/23 103 lb (46.7 kg)    GEN: Well nourished, well developed in no acute distress NECK: No JVD;  No carotid bruits CARDIAC: RRR, no murmurs, rubs, gallops RESPIRATORY:  Clear to auscultation without rales, wheezing or rhonchi  ABDOMEN: Soft, non-tender, non-distended EXTREMITIES:  mild lower leg edema; No deformity   ASSESSMENT AND PLAN: .    Acute on chronic systolic heart failure The patient reports unchanged lower leg edema. She is taking Torsemide  20mg  daily. We will add Torsemide  10mg  in the afternoon. BMET in 2 weeks. Continue Entresto  24-26mg BID, spiro 25mg  daily, and bisoprolol  2.5mg  daily.   S/p MV replacement with bioprosthetic valve Recent echo showed normally functioning valve.   CAD s/p 1V CABG with LIMA to LAD The patient denies anginal symptoms. No further ischemic work-up indicated. Continue bisoprolol  2.5mg  daily and simvastatin  10mg  daily.    Persistent Afib She remains in NSR. Continue amiodarone  200mg  daily and warfarin.   S/p PPM She is followed by EP. EKG shows AV pacing.        Dispo: Follow-up in 1 month  Signed, Verla Bryngelson Rebekah Canada, PA-C

## 2024-01-01 NOTE — Patient Instructions (Signed)
 Medication Instructions:  Your physician recommends the following medication changes.  INCREASE: Lasix  20 mg every morning and 10 mg every afternoon.   *If you need a refill on your cardiac medications before your next appointment, please call your pharmacy*  Lab Work: No labs ordered today  If you have labs (blood work) drawn today and your tests are completely normal, you will receive your results only by: MyChart Message (if you have MyChart) OR A paper copy in the mail If you have any lab test that is abnormal or we need to change your treatment, we will call you to review the results.  Testing/Procedures: No test ordered today   Follow-Up: At [facility], you and your health needs are our priority.  As part of our continuing mission to provide you with exceptional heart care, our providers are all part of one team.  This team includes your primary Cardiologist (physician) and Advanced Practice Providers or APPs (Physician Assistants and Nurse Practitioners) who all work together to provide you with the care you need, when you need it.  Your next appointment:   1 month(s)  Provider:   Toribio Frees, PA-C

## 2024-01-07 ENCOUNTER — Ambulatory Visit: Attending: Cardiovascular Disease

## 2024-01-07 DIAGNOSIS — Z5181 Encounter for therapeutic drug level monitoring: Secondary | ICD-10-CM | POA: Diagnosis present

## 2024-01-07 DIAGNOSIS — I340 Nonrheumatic mitral (valve) insufficiency: Secondary | ICD-10-CM

## 2024-01-07 DIAGNOSIS — Z953 Presence of xenogenic heart valve: Secondary | ICD-10-CM

## 2024-01-07 LAB — POCT INR: INR: 1.6 — AB (ref 2.0–3.0)

## 2024-01-07 NOTE — Patient Instructions (Signed)
 Take 1 tablet today only then Increase to 1 tablet daily, except 0.5 tablet on Monday, Wednesday and Friday. - Recheck INR in 2 weeks. [phone]

## 2024-01-13 ENCOUNTER — Other Ambulatory Visit: Payer: Self-pay | Admitting: Cardiovascular Disease

## 2024-01-14 ENCOUNTER — Telehealth: Payer: Self-pay | Admitting: Cardiovascular Disease

## 2024-01-14 NOTE — Telephone Encounter (Signed)
 Pt called and stated she has stopped drink the protein Drinks and

## 2024-01-14 NOTE — Progress Notes (Signed)
 Remote pacemaker transmission.

## 2024-01-16 NOTE — Telephone Encounter (Signed)
 Spoke to patient she stated she stopped drinking protein shakes 2 days ago.Stated last time she had INR coumadin  dose changed due to her drinking protein shakes.She has appt for INR 5/14.She wants to know if she needs to change coumadin  dose.Advised I will send message to coumadin  clinic.

## 2024-01-16 NOTE — Telephone Encounter (Signed)
 Pt called and state that she is no longer drinking  the protein drinks and would like to know is she could change her meds.  She stated her blood was a little think last time and she thought It was because of the protein drinks.  She would like to speak with a nurse

## 2024-01-21 ENCOUNTER — Ambulatory Visit: Attending: Cardiovascular Disease

## 2024-01-21 DIAGNOSIS — I340 Nonrheumatic mitral (valve) insufficiency: Secondary | ICD-10-CM | POA: Diagnosis present

## 2024-01-21 DIAGNOSIS — Z953 Presence of xenogenic heart valve: Secondary | ICD-10-CM | POA: Diagnosis present

## 2024-01-21 DIAGNOSIS — Z5181 Encounter for therapeutic drug level monitoring: Secondary | ICD-10-CM | POA: Insufficient documentation

## 2024-01-21 LAB — POCT INR: INR: 3 (ref 2.0–3.0)

## 2024-01-21 NOTE — Patient Instructions (Signed)
 Continue 1 tablet daily, except 0.5 tablet on Monday, Wednesday and Friday. - Recheck INR in 6 weeks. [phone]

## 2024-01-25 ENCOUNTER — Other Ambulatory Visit: Payer: Self-pay | Admitting: Internal Medicine

## 2024-01-27 ENCOUNTER — Encounter (INDEPENDENT_AMBULATORY_CARE_PROVIDER_SITE_OTHER): Payer: Self-pay

## 2024-02-12 ENCOUNTER — Encounter: Payer: Self-pay | Admitting: Medical

## 2024-02-12 ENCOUNTER — Ambulatory Visit: Attending: Medical | Admitting: Medical

## 2024-02-12 VITALS — BP 104/48 | HR 88 | Ht 61.0 in | Wt 111.1 lb

## 2024-02-12 DIAGNOSIS — I2510 Atherosclerotic heart disease of native coronary artery without angina pectoris: Secondary | ICD-10-CM | POA: Diagnosis not present

## 2024-02-12 DIAGNOSIS — Z79899 Other long term (current) drug therapy: Secondary | ICD-10-CM | POA: Insufficient documentation

## 2024-02-12 DIAGNOSIS — Z950 Presence of cardiac pacemaker: Secondary | ICD-10-CM | POA: Insufficient documentation

## 2024-02-12 DIAGNOSIS — I4819 Other persistent atrial fibrillation: Secondary | ICD-10-CM | POA: Insufficient documentation

## 2024-02-12 DIAGNOSIS — Z953 Presence of xenogenic heart valve: Secondary | ICD-10-CM | POA: Diagnosis not present

## 2024-02-12 DIAGNOSIS — I5022 Chronic systolic (congestive) heart failure: Secondary | ICD-10-CM | POA: Insufficient documentation

## 2024-02-12 NOTE — Progress Notes (Signed)
 Cardiology Office Note   Date:  02/12/2024  ID:  [name], DOB 03-22-1935, MRN [id number] PCP: Jamie Dixons, MD  [facility] Providers Cardiologist:  Jamie Kirks, MD Electrophysiologist:  Richardo Chandler, MD    History of Present Illness [name] is a 89 y.o. 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    She underwent mitral valve replacement with a bioprosthetic valve, tricuspid valve repair, one-vessel CABG with LIMA to LAD and maze procedure in August 2016. This was complicated by sternal wound infection which required debridement. She also had complete heart block and underwent permanent pacemaker placement.   In 08/2017, she had worsening heart failure.  Echocardiogram showed an EF of 30 to 35%. She was seen by EP and it was felt that LV dysfunction was in the context of RV apical pacing. That was inactivated and she noted immediate improvement in symptoms. She had worsening SOB in 2019 and echo was ordered, which showed a drop in EF 20-25% in the setting of afib with RVR. She was rate controlled and started on amiodarone  and subsequently underwent cardioversion with restoration of NSR. EF improved to 35-40% after that.    Right heart catheterization was done in June 2020 which showed moderately elevated filling pressures, severe pulmonary hypertension and mildly reduced cardiac output.  There was no evidence of left-to-right intracardiac shunting by saturation run.  Prominent V waves were noted on pulmonary capillary wedge pressure tracing suggestive of significant mitral regurgitation.  Symptoms improved after increasing furosemide .   She underwent attempted upgrade  of her pacemaker to a biventricular device but the procedure was not successful due to inability to place an LV lead. She had recurrent atrial fibrillation in January 2021 that required cardioversion.  She has been in sinus rhythm since then.   The patient was seen 11/20/23 and was felt to be mildly volume up. Lasix  was switched to Torsemide  20mg  daily.   The patient was last seen 01/01/24 and torsemide  was increased to 20mg  in the AM and 10mg  in the pm.   Today, the patient has overall been doing better. The extra half pill of lasix  has improved swelling. Breathing is stable, no chest pain. Otherwise, no chest pain.   Studies Reviewed      Echo 06/2023  1. Left ventricular ejection fraction, by estimation, is 30 to 35%. Left  ventricular ejection fraction by 3D volume is 33 %. The left ventricle has  moderately decreased function. Inferior/lateral wall motion best  preserved. The left ventricle  demonstrates global hypokinesis. Left ventricular diastolic parameters are  indeterminate. The average left ventricular global longitudinal strain is  -6.9 %. The global longitudinal strain is abnormal.   2. Right ventricular systolic function is moderately reduced. The right  ventricular size is moderately enlarged. There is moderately elevated  pulmonary artery systolic pressure. The estimated right ventricular  systolic pressure is 56.2 mmHg.   3. Left atrial size was moderately dilated.   4. The mitral valve has been repaired/replaced. No evidence of mitral  valve regurgitation. No evidence of mitral stenosis. The mean mitral valve  gradient is 6.0 mmHg. S/p Mitral Valve: 27 mm [employer],  bioprosthetic valve   5. The tricuspid valve is has been repaired/replaced. Tricuspid valve  regurgitation is mild to moderate.   6. The aortic valve is tricuspid. Aortic valve regurgitation is mild. No  aortic stenosis is present.   7. The inferior vena cava is dilated in size  with >50% respiratory  variability, suggesting right atrial pressure of 8 mmHg.    Echo 2022  1. Left ventricular ejection fraction, by estimation, is 35 to 40%. The  left ventricle has moderately decreased function. The left ventricle  demonstrates global hypokinesis. The left ventricular internal cavity size  was mildly dilated. Left ventricular  diastolic function could not be evaluated.   2. Right ventricular systolic function is severely reduced. The right  ventricular size is normal. There is severely elevated pulmonary artery  systolic pressure.   3. Left atrial size was mild to moderately dilated.   4. The mitral valve has been repaired/replaced. Trivial mitral valve  regurgitation. The mean mitral valve gradient is 5.0 mmHg. There is a 27  mm [facility] Ease bioprosthetic valve present in the mitral  position. Procedure Date: 2016.   5. The tricuspid valve is has been repaired/replaced.   6. The aortic valve has an indeterminant number of cusps. There is mild  thickening of the aortic valve. Aortic valve regurgitation is mild. No  aortic stenosis is present.   7. The inferior vena cava is normal in size with greater than 50%  respiratory variability, suggesting right atrial pressure of 3 mmHg.         Physical Exam VS:  BP (!) 104/48 (BP Location: Left Arm, Patient Position: Sitting, Cuff Size: Normal)   Pulse 88   Ht 5\' 1"  (1.549 m)   Wt 111 lb 2 oz (50.4 kg)   BMI 21.00 kg/m    Wt Readings from Last 3 Encounters:  02/12/24 111 lb 2 oz (50.4 kg)  01/01/24 102 lb (46.3 kg)  12/09/23 104 lb 6 oz (47.3 kg)    GEN: Well nourished, well developed in no acute distress NECK: No JVD; No carotid bruits CARDIAC: RRR, no murmurs, rubs, gallops RESPIRATORY:  Clear to auscultation without rales, wheezing or rhonchi  ABDOMEN: Soft, non-tender, non-distended EXTREMITIES:  mild lower extremity edema; No deformity   ASSESSMENT AND PLAN  Acute on chronic systolic  heart failure The patient reports improved lower leg edema with the extra dose of torsemide . BMET today. She still has mild lower leg edema for which I recommended compression socks. Continue torsemide  20mg  in the AM and 10mg  in the PM. Continue Entresto  24-26mg BID, spiro 25mg  daily, and bisoprolol  2.5mg  daily.   S/p MV replacement with a bioprosthetic valve Most recent echo showed normally functioning valve.   CAD s/p 1V CAD with LIMA to LAD The patient denies chest pain. Continue bisoprolol  and simvastatin .   Persistent Afib She remains in NSR. Continue amiodarone  200mg  daily. Continue warfarin for stroke ppx.   S/p PPM  she is followed by EP.     Dispo: Follow-up in 6 months  Signed, Bernadean Saling Rebekah Canada, PA-C

## 2024-02-12 NOTE — Patient Instructions (Signed)
 Medication Instructions:   Your physician recommends that you continue on your current medications as directed. Please refer to the Current Medication list given to you today.   *If you need a refill on your cardiac medications before your next appointment, please call your pharmacy*  Lab Work:  Your provider would like for you to have following labs drawn today BMET.     If you have any lab test that is abnormal or we need to change your treatment, we will call you to review the results.  Testing/Procedures:  No test ordered today   Follow-Up: At [facility], you and your health needs are our priority.  As part of our continuing mission to provide you with exceptional heart care, our providers are all part of one team.  This team includes your primary Cardiologist (physician) and Advanced Practice Providers or APPs (Physician Assistants and Nurse Practitioners) who all work together to provide you with the care you need, when you need it.  Your next appointment:   6 month(s)  Provider:   You may see Antionette Kirks, MD or one of the following Advanced Practice Providers on your designated Care Team:    Cadence [place], PA-C

## 2024-02-14 LAB — BASIC METABOLIC PANEL WITH GFR
BUN/Creatinine Ratio: 21 (ref 12–28)
BUN: 22 mg/dL (ref 8–27)
CO2: 30 mmol/L — ABNORMAL HIGH (ref 20–29)
Calcium: 9 mg/dL (ref 8.7–10.3)
Chloride: 96 mmol/L (ref 96–106)
Creatinine, Ser: 1.04 mg/dL — ABNORMAL HIGH (ref 0.57–1.00)
Glucose: 73 mg/dL (ref 70–99)
Potassium: 4.3 mmol/L (ref 3.5–5.2)
Sodium: 145 mmol/L — ABNORMAL HIGH (ref 134–144)
eGFR: 52 mL/min/{1.73_m2} — ABNORMAL LOW (ref >59)

## 2024-02-16 ENCOUNTER — Ambulatory Visit: Payer: Self-pay

## 2024-03-03 ENCOUNTER — Ambulatory Visit: Attending: Cardiovascular Disease

## 2024-03-03 DIAGNOSIS — Z953 Presence of xenogenic heart valve: Secondary | ICD-10-CM | POA: Diagnosis present

## 2024-03-03 DIAGNOSIS — I340 Nonrheumatic mitral (valve) insufficiency: Secondary | ICD-10-CM | POA: Insufficient documentation

## 2024-03-03 DIAGNOSIS — Z5181 Encounter for therapeutic drug level monitoring: Secondary | ICD-10-CM | POA: Insufficient documentation

## 2024-03-03 LAB — POCT INR: INR: 2.7 (ref 2.0–3.0)

## 2024-03-03 NOTE — Patient Instructions (Signed)
 Continue 1 tablet daily, except 0.5 tablet on Monday, Wednesday and Friday. - Recheck INR in 6 weeks. [phone]

## 2024-03-04 ENCOUNTER — Ambulatory Visit (INDEPENDENT_AMBULATORY_CARE_PROVIDER_SITE_OTHER): Payer: Medicare Other

## 2024-03-04 DIAGNOSIS — I428 Other cardiomyopathies: Secondary | ICD-10-CM | POA: Diagnosis not present

## 2024-03-04 LAB — CUP PACEART REMOTE DEVICE CHECK
Battery Remaining Longevity: 9 mo
Battery Voltage: 2.88 V
Brady Statistic AP VP Percent: 79.82 %
Brady Statistic AP VS Percent: 0.01 %
Brady Statistic AS VP Percent: 13.7 %
Brady Statistic AS VS Percent: 6.46 %
Brady Statistic RA Percent Paced: 77.57 %
Brady Statistic RV Percent Paced: 90.81 %
Date Time Interrogation Session: 20250626071439
Implantable Lead Connection Status: 753985
Implantable Lead Connection Status: 753985
Implantable Lead Implant Date: 20160823
Implantable Lead Implant Date: 20160823
Implantable Lead Location: 753859
Implantable Lead Location: 753860
Implantable Lead Model: 5076
Implantable Lead Model: 5076
Implantable Pulse Generator Implant Date: 20160823
Lead Channel Impedance Value: 304 Ohm
Lead Channel Impedance Value: 342 Ohm
Lead Channel Impedance Value: 342 Ohm
Lead Channel Impedance Value: 361 Ohm
Lead Channel Pacing Threshold Amplitude: 0.5 V
Lead Channel Pacing Threshold Amplitude: 0.625 V
Lead Channel Pacing Threshold Pulse Width: 0.4 ms
Lead Channel Pacing Threshold Pulse Width: 0.4 ms
Lead Channel Sensing Intrinsic Amplitude: 1 mV
Lead Channel Sensing Intrinsic Amplitude: 1 mV
Lead Channel Sensing Intrinsic Amplitude: 13.25 mV
Lead Channel Sensing Intrinsic Amplitude: 13.25 mV
Lead Channel Setting Pacing Amplitude: 1.5 V
Lead Channel Setting Pacing Amplitude: 2 V
Lead Channel Setting Pacing Pulse Width: 0.4 ms
Lead Channel Setting Sensing Sensitivity: 2.8 mV
Zone Setting Status: 755011

## 2024-03-12 ENCOUNTER — Other Ambulatory Visit: Payer: Self-pay | Admitting: Cardiovascular Disease

## 2024-03-12 ENCOUNTER — Other Ambulatory Visit: Payer: Self-pay | Admitting: Internal Medicine

## 2024-03-12 DIAGNOSIS — M5432 Sciatica, left side: Secondary | ICD-10-CM

## 2024-03-16 ENCOUNTER — Other Ambulatory Visit: Payer: Self-pay | Admitting: Internal Medicine

## 2024-03-16 NOTE — Telephone Encounter (Signed)
 Requested Prescriptions  Pending Prescriptions Disp Refills   gabapentin  (NEURONTIN ) 100 MG capsule [Pharmacy Med Name: GABAPENTIN  100 MG CAPSULE] 180 capsule 0    Sig: TAKE 2 CAPSULES BY MOUTH AT BEDTIME.     Neurology: Anticonvulsants - gabapentin  Failed - 03/16/2024 11:24 AM      Failed - Cr in normal range and within 360 days    Creatinine, Ser  Date Value Ref Range Status  02/12/2024 1.04 (H) 0.57 - 1.00 mg/dL Final         Passed - Completed PHQ-2 or PHQ-9 in the last 360 days      Passed - Valid encounter within last 12 months    Recent Outpatient Visits           3 months ago S/P heart valve replacement with bioprosthetic valve   [facility], Leita DEL, MD       Future Appointments             In 3 days Justus Leita DEL, MD [facility], [facility]

## 2024-03-19 ENCOUNTER — Encounter: Payer: Self-pay | Admitting: Internal Medicine

## 2024-03-19 ENCOUNTER — Ambulatory Visit: Payer: Self-pay | Admitting: Internal Medicine

## 2024-03-19 VITALS — BP 108/62 | HR 104 | Ht 61.0 in | Wt 101.6 lb

## 2024-03-19 DIAGNOSIS — E039 Hypothyroidism, unspecified: Secondary | ICD-10-CM

## 2024-03-19 DIAGNOSIS — N1831 Chronic kidney disease, stage 3a: Secondary | ICD-10-CM

## 2024-03-19 DIAGNOSIS — J9611 Chronic respiratory failure with hypoxia: Secondary | ICD-10-CM

## 2024-03-19 DIAGNOSIS — J449 Chronic obstructive pulmonary disease, unspecified: Secondary | ICD-10-CM | POA: Diagnosis not present

## 2024-03-19 DIAGNOSIS — D6869 Other thrombophilia: Secondary | ICD-10-CM

## 2024-03-19 DIAGNOSIS — I10 Essential (primary) hypertension: Secondary | ICD-10-CM

## 2024-03-19 NOTE — Assessment & Plan Note (Signed)
 Blood pressure is well controlled.  Current medications - Enstresto, spironolactone , bisoprolol  Will continue same regimen along with efforts to limit dietary sodium.

## 2024-03-19 NOTE — Progress Notes (Signed)
 Date:  03/19/2024   Name:  [name]   DOB:  12/06/1934   MRN:  [id number]   Chief Complaint: Hypertension, COPD, and Hypothyroidism  Hypertension This is a chronic problem. The problem is controlled. Associated symptoms include shortness of breath. Pertinent negatives include no chest pain, headaches or palpitations. Past treatments include angiotensin blockers, diuretics and beta blockers. The current treatment provides significant improvement. Hypertensive end-organ damage includes kidney disease and CAD/MI. Identifiable causes of hypertension include a thyroid problem.  Thyroid Problem Presents for follow-up visit. Patient reports no anxiety, constipation, depressed mood, fatigue, palpitations, weight gain or weight loss. The symptoms have been stable.  COPD - chronic resp failure, on O2 ATC. No recent infections or worsening.  Review of Systems  Constitutional:  Negative for chills, fatigue, fever, unexpected weight change, weight gain and weight loss.  HENT:  Positive for hearing loss (has cochlear implant on left).   Respiratory:  Positive for shortness of breath. Negative for chest tightness and wheezing.   Cardiovascular:  Negative for chest pain, palpitations and leg swelling.  Gastrointestinal:  Negative for constipation.  Genitourinary:  Negative for dysuria.  Musculoskeletal:  Positive for myalgias.  Skin:  Negative for color change and rash.  Neurological:  Negative for dizziness and headaches.  Psychiatric/Behavioral:  Negative for dysphoric mood and sleep disturbance. The patient is not nervous/anxious.      Lab Results  Component Value Date   NA 145 (H) 02/12/2024   K 4.3 02/12/2024   CO2 30 (H) 02/12/2024   GLUCOSE 73 02/12/2024   BUN 22 02/12/2024   CREATININE 1.04 (H) 02/12/2024   CALCIUM  9.0 02/12/2024   EGFR 52 (L) 02/12/2024   GFRNONAA 57 (L) 09/29/2023   Lab Results  Component Value Date   CHOL 164 04/09/2023   HDL 61 04/09/2023   LDLCALC 86  04/09/2023   TRIG 92 04/09/2023   CHOLHDL 2.7 04/09/2023   Lab Results  Component Value Date   TSH 0.748 08/19/2023   Lab Results  Component Value Date   HGBA1C 5.4 08/21/2022   Lab Results  Component Value Date   WBC 6.4 12/02/2023   HGB 10.1 (L) 12/02/2023   HCT 31.8 (L) 12/02/2023   MCV 100 (H) 12/02/2023   PLT 200 12/02/2023   Lab Results  Component Value Date   ALT 14 08/19/2023   AST 17 08/19/2023   ALKPHOS 61 08/19/2023   BILITOT 0.3 08/19/2023   Lab Results  Component Value Date   VD25OH 44.4 04/09/2023     Patient Active Problem List   Diagnosis Date Noted   Dependence on supplemental oxygen 12/15/2023   Underweight 12/09/2023   Chronic anemia 09/25/2023   Thrombocytopenia (HCC) 09/25/2023   Anticoagulated on Coumadin  09/25/2023   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 09/25/2023   Status cardiac pacemaker secondary to postoperative complete heart block 09/25/2023   Physical deconditioning/frailty 09/25/2023   Macular degeneration 12/10/2022   Chronic respiratory failure with hypoxia (HCC) 04/08/2022   Gastroesophageal reflux disease 02/19/2022   Sciatica of left side 08/21/2021   Aortoiliac occlusive disease (HCC) 05/22/2021   Cochlear implant in place 05/22/2021   Acquired thrombophilia (HCC) 08/11/2020   Stage 3a chronic kidney disease (CKD) (HCC) 08/11/2020   Primary insomnia 07/31/2019   Osteoporosis, postmenopausal 01/23/2018   Coronary artery disease involving native coronary artery of native heart with angina pectoris (HCC) 01/13/2017   S/P placement of cardiac pacemaker 05/02/15, medtronic 05/03/2015  Bradycardia 05/03/2015   Presence of cardiac pacemaker 05/03/2015   S/P tricuspid valve repair 04/25/2015   Atrial fibrillation-persistent, with history of maze procedure 04/25/2015   CAD s/p CABGx1 04/25/2015   Pulmonary hypertension (HCC)    Chronic combined systolic and diastolic CHF (congestive heart  failure) (HCC)    Chronic systolic heart failure (HCC) 11/10/2014   Essential hypertension 11/10/2014   Hyperlipidemia 11/10/2014   Prediabetes 05/08/2014   Auditory vertigo 05/08/2014   Adult hypothyroidism 05/08/2014   H/O adenomatous polyp of colon 05/08/2014   Stage 4 very severe COPD by GOLD classification (HCC) 03/28/2014    Allergies  Allergen Reactions   Ace Inhibitors Cough and Other (See Comments)   Amoxicillin-Pot Clavulanate Swelling and Other (See Comments)    SWOLLEN JOINTS  Has patient had a PCN reaction causing immediate rash, facial/tongue/throat swelling, SOB or lightheadedness with hypotension: Yes Has patient had a PCN reaction causing severe rash involving mucus membranes or skin necrosis: No Has patient had a PCN reaction that required hospitalization No Has patient had a PCN reaction occurring within the last 10 years: No If all of the above answers are NO, then may proceed with Cephalosporin   Penicillins Other (See Comments) and Swelling    SWOLLEN JOINTS   Has patient had a PCN reaction causing immediate rash, facial/tongue/throat swelling, SOB or lightheadedness with hypotension: Yes  Has patient had a PCN reaction causing severe rash involving mucus membranes or skin necrosis: No  Has patient had a PCN reaction that required hospitalization No  Has patient had a PCN reaction occurring within the last 10 years: No  If all of the above answers are NO, then may proceed with Cephalosporin use.  Swollen joints  Swollen joints, Has patient had a PCN reaction causing immediate rash, facial/tongue/throat swelling, SOB or lightheadedness with hypotension: Yes, Has patient had a PCN reaction causing severe rash involving mucus membranes or skin necrosis: No, Has patient had a PCN reaction that required hospitalization No, Has patient had a PCN reaction occurring within the last 10 years: No, If all of the above answers are NO, then may proceed with  Cephalosporin use.   Rosuvastatin  Other (See Comments)   Nifedipine Other (See Comments)    UNSPECIFIED   Propranolol Other (See Comments)    UNSPECIFIED   Diazepam Other (See Comments)    Made her hyper    Meperidine  Other (See Comments)    Made her hyper     Propoxyphene Other (See Comments)    Made her hyper      Past Surgical History:  Procedure Laterality Date   APPENDECTOMY     APPLICATION OF A-CELL OF CHEST/ABDOMEN N/A 11/21/2015   Procedure: APPLICATION OF A-CELL OF CHEST/ABDOMEN;  Surgeon: Estefana RAMAN Dillingham, DO;  [facility];  Service: Plastics;  Laterality: N/A;   APPLICATION OF A-CELL OF CHEST/ABDOMEN Right 12/21/2015   Procedure: APPLICATION OF A-CELL OF RIGHT CHEST;  Surgeon: Estefana RAMAN Dillingham, DO;  [facility];  Service: Plastics;  Laterality: Right;   APPLICATION OF A-CELL OF CHEST/ABDOMEN Right 01/11/2016   Procedure: APPLICATION OF A-CELL OF RIGHT CHEST;  Surgeon: Estefana RAMAN Dillingham, DO;  [facility]: [facility];  Service: Plastics;  Laterality: Right;   APPLICATION OF A-CELL OF CHEST/ABDOMEN Right 02/12/2016   Procedure: APPLICATION OF A-CELL TO RIGHT CHEST WOUND;  Surgeon: Estefana RAMAN Dillingham, DO;  [facility];  Service: Plastics;  Laterality: Right;   APPLICATION OF WOUND VAC N/A 05/09/2015   Procedure: APPLICATION OF  WOUND VAC;  Surgeon: Sudie VEAR Laine, MD;  Location: [facility] OR;  Service: Thoracic;  Laterality: N/A;   APPLICATION OF WOUND VAC N/A 11/13/2015   Procedure: APPLICATION OF WOUND VAC;  Surgeon: Sudie VEAR Laine, MD;  [facility];  Service: Thoracic;  Laterality: N/A;   APPLICATION OF WOUND VAC N/A 11/21/2015   Procedure: APPLICATION OF WOUND VAC;  Surgeon: Estefana RAMAN Dillingham, DO;  [facility];  Service: Plastics;  Laterality: N/A;   APPLICATION OF WOUND VAC Right 12/21/2015   Procedure: APPLICATION OF WOUND VAC to right chest wall ;  Surgeon: Estefana RAMAN Dillingham, DO;  [facility];  Service: Plastics;  Laterality: Right;   APPLICATION  OF WOUND VAC Right 01/11/2016   Procedure: APPLICATION OF WOUND VAC RIGHT CHEST ;  Surgeon: Estefana RAMAN Dillingham, DO;  [facility];  Service: Plastics;  Laterality: Right;   APPLICATION OF WOUND VAC Right 01/24/2016   Procedure: APPLICATION OF WOUND VAC RIGHT CHEST WALL;  Surgeon: Estefana RAMAN Dillingham, DO;  [facility];  Service: Plastics;  Laterality: Right;   APPLICATION OF WOUND VAC Right 02/12/2016   Procedure: RE-APPLICATION OF WOUND VAC TO RIGHT CHEST WOUND;  Surgeon: Estefana RAMAN Dillingham, DO;  [facility];  Service: Plastics;  Laterality: Right;   BIV UPGRADE N/A 06/07/2019   Procedure: BIV UPGRADE;  Surgeon: Fernande Elspeth BROCKS, MD;  [facility] INVASIVE CV LAB;  Service: Cardiovascular;  Laterality: N/A;   BREAST BIOPSY Left 11/25/06   neg   BREAST BIOPSY Left 01/20/12   /clip-neg   CARDIAC CATHETERIZATION  11/2013   [facility]   CARDIAC CATHETERIZATION  10/2014   [facility]   CARDIAC VALVE REPLACEMENT     CARDIOVERSION N/A 07/20/2018   Procedure: CARDIOVERSION;  Surgeon: Darron Deatrice LABOR, MD;  Location: [facility] ORS;  Service: Cardiovascular;  Laterality: N/A;   CARDIOVERSION N/A 09/18/2020   Procedure: CARDIOVERSION;  Surgeon: Perla Evalene PARAS, MD;  Location: [facility] ORS;  Service: Cardiovascular;  Laterality: N/A;   CATARACT EXTRACTION W/ INTRAOCULAR LENS  IMPLANT, BILATERAL Bilateral 2014   CLIPPING OF ATRIAL APPENDAGE N/A 04/25/2015   Procedure: CLIPPING OF ATRIAL APPENDAGE;  Surgeon: Sudie VEAR Laine, MD;  [facility];  Service: Open Heart Surgery;  Laterality: N/A;   COCHLEAR IMPLANT Left 2005?   COLONOSCOPY WITH PROPOFOL  N/A 01/20/2018   Procedure: COLONOSCOPY WITH PROPOFOL ;  Surgeon: Toledo, Ladell POUR, MD;  Location: [facility] ENDOSCOPY;  Service: Gastroenterology;  Laterality: N/A;   CORONARY ANGIOPLASTY     CORONARY ARTERY BYPASS GRAFT N/A 04/25/2015   Procedure: CORONARY ARTERY BYPASS GRAFTING (CABG) x ONE, using left internal mammary artery;  Surgeon: Sudie VEAR Laine, MD;  [facility];   Service: Open Heart Surgery;  Laterality: N/A;   DILATION AND CURETTAGE OF UTERUS  several before hysterectomy   EP IMPLANTABLE DEVICE N/A 05/02/2015   Procedure: Pacemaker Implant;  Surgeon: Lynwood Rakers, MD;  [facility] INVAS[facility] LAB;  Service: Cardiovascular;  Laterality: N/A;   ESOPHAGOGASTRODUODENOSCOPY (EGD) WITH PROPOFOL  N/A 01/20/2018   Procedure: ESOPHAGOGASTRODUODENOSCOPY (EGD) WITH PROPOFOL ;  Surgeon: Toledo, Ladell POUR, MD;  Location: [facility] ENDOSCOPY;  Service: Gastroenterology;  Laterality: N/A;   EYE SURGERY     I & D EXTREMITY Right 12/06/2015   Procedure: IRRIGATION AND DEBRIDEMENT RIGHT CHEST WALL WITH ACELL PLACEMENT AND VAC;  Surgeon: Estefana RAMAN Dillingham, DO;  [facility];  Service: Plastics;  Laterality: Right;   INCISION AND DRAINAGE OF WOUND N/A 11/21/2015   Procedure: IRRIGATION AND DEBRIDEMENT  WOUND;  Surgeon: Estefana RAMAN Dillingham, DO;  [facility];  Service: Plastics;  Laterality: N/A;   INCISION AND DRAINAGE OF WOUND Right 12/21/2015   Procedure: IRRIGATION AND DEBRIDEMENT right chest wall WOUND;  Surgeon: Estefana RAMAN Dillingham, DO;  [facility];  Service: Plastics;  Laterality: Right;  right chest wall    INCISION AND DRAINAGE OF WOUND Right 01/24/2016   Procedure: IRRIGATION AND DEBRIDEMENT RIGHT CHEST WALL WOUND;  Surgeon: Estefana RAMAN Dillingham, DO;  [facility];  Service: Plastics;  Laterality: Right;   INCISION AND DRAINAGE OF WOUND Right 03/28/2016   Procedure: IRRIGATION AND DEBRIDEMENT RIGHT CHEST WALL WOUND WITH A Cell Placement;  Surgeon: Estefana RAMAN Dillingham, DO;  [facility];  Service: Plastics;  Laterality: Right;   INCISION AND DRAINAGE OF WOUND Right 05/17/2016   Procedure: IRRIGATION AND DEBRIDEMENT OF RIGHT CHEST WOUND WITH A CELL PLACEMENT;  Surgeon: Estefana RAMAN Dillingham, DO;  [facility];  Service: Plastics;  Laterality: Right;   INSERT / REPLACE / REMOVE PACEMAKER     IRRIGATION AND DEBRIDEMENT OF WOUND WITH SPLIT THICKNESS SKIN GRAFT  Right 01/11/2016   Procedure: IRRIGATION AND DEBRIDEMENT OF RIGHT CHEST WOUND ;  Surgeon: Estefana RAMAN Dillingham, DO;  [facility];  Service: Plastics;  Laterality: Right;   MASTECTOMY, PARTIAL Right 2002   positive, partial   MAZE N/A 04/25/2015   Procedure: MAZE;  Surgeon: Sudie VEAR Laine, MD;  Location: [facility] OR;  Service: Open Heart Surgery;  Laterality: N/A;   MITRAL VALVE REPAIR N/A 04/25/2015   Procedure: MITRAL VALVE  REPLACEMENT using a 27 mm Edwards Perimount Magna Mitral Ease Valve;  Surgeon: Sudie VEAR Laine, MD;  [facility];  Service: Open Heart Surgery;  Laterality: N/A;   RIGHT HEART CATH N/A 02/08/2019   Procedure: RIGHT HEART CATH;  Surgeon: Darron Deatrice LABOR, MD;  Location: [facility] INVASIVE CV LAB;  Service: Cardiovascular;  Laterality: N/A;   SKIN SPLIT GRAFT Right 02/12/2016   Procedure: IRRIGATION AND DEBRIDEMENT RIGHT CHEST WOUND;  Surgeon: Estefana RAMAN Dillingham, DO;  [facility];  Service: Plastics;  Laterality: Right;   STERNAL WIRES REMOVAL N/A 06/05/2015   Procedure: STERNAL WIRES REMOVAL;  Surgeon: Sudie VEAR Laine, MD;  [facility];  Service: Thoracic;  Laterality: N/A;   STERNAL WOUND DEBRIDEMENT N/A 05/09/2015   Procedure: STERNAL WOUND DEBRIDEMENT;  Surgeon: Sudie VEAR Laine, MD;  [facility];  Service: Thoracic;  Laterality: N/A;   STERNAL WOUND DEBRIDEMENT N/A 11/13/2015   Procedure: Excisional drainage of RIGHT Chest wall mass and breast mass ;  Surgeon: Sudie VEAR Laine, MD;  [facility];  Service: Thoracic;  Laterality: N/A;   TEE WITHOUT CARDIOVERSION N/A 04/25/2015   Procedure: TRANSESOPHAGEAL ECHOCARDIOGRAM (TEE);  Surgeon: Sudie VEAR Laine, MD;  Location: [facility] OR;  Service: Open Heart Surgery;  Laterality: N/A;   TONSILLECTOMY     TRAM Right 11/18/2015   Procedure: VRAM Vertical Rectus Abdominus Muscle Flap;  Surgeon: Estefana RAMAN Dillingham, DO;  [facility];  Service: Plastics;  Laterality: Right;  RIght Back   TRICUSPID VALVE REPLACEMENT N/A 04/25/2015    Procedure: TRICUSPID VALVE REPAIR;  Surgeon: Sudie VEAR Laine, MD;  [facility];  Service: Open Heart Surgery;  Laterality: N/A;   VAGINAL HYSTERECTOMY      Social History   Tobacco Use   Smoking status: Former    Current packs/day: 0.00    Average packs/day: 1.5 packs/day for 40.0 years (60.0 ttl pk-yrs)  Types: Cigarettes    Start date: 04/20/1958    Quit date: 04/20/1998    Years since quitting: 25.9   Smokeless tobacco: Never  Vaping Use   Vaping status: Never Used  Substance Use Topics   Alcohol use: Not Currently    Comment: 11/10/2015 glass of wine a few times/year, if that   Drug use: No     Medication list has been reviewed and updated.  Current Meds  Medication Sig   acetaminophen  (TYLENOL ) 500 MG tablet Take 1,000 mg by mouth every 6 (six) hours as needed (for pain.).   amiodarone  (PACERONE ) 200 MG tablet TAKE 1 TABLET (200 MG) 5 DAYS PER WEEK   bisoprolol  (ZEBETA ) 5 MG tablet TAKE 1/2 TABLET BY MOUTH DAILY   Cyanocobalamin (VITAMIN B-12 PO) Take 1,000 mcg by mouth daily.   diphenhydrAMINE  (BENADRYL ) 25 mg capsule Take 25 mg by mouth every 6 (six) hours as needed for allergies.   docusate sodium  (COLACE) 100 MG capsule Take 100 mg by mouth 2 (two) times daily as needed (constipation.).   feeding supplement (ENSURE ENLIVE / ENSURE PLUS) LIQD Take 237 mLs by mouth 2 (two) times daily between meals.   Ferrous Sulfate  (IRON PO) Take 1 tablet by mouth daily.   gabapentin  (NEURONTIN ) 100 MG capsule TAKE 2 CAPSULES BY MOUTH AT BEDTIME.   ipratropium-albuterol  (DUONEB) 0.5-2.5 (3) MG/3ML SOLN Inhale 3 mLs into the lungs every 6 (six) hours as needed.   levothyroxine  (SYNTHROID ) 88 MCG tablet Take 1 tablet (88 mcg total) by mouth daily before breakfast.   OXYGEN Inhale 2 L into the lungs continuous.   pantoprazole  (PROTONIX ) 40 MG tablet Take 1 tablet (40 mg total) by mouth 2 (two) times daily.   sacubitril -valsartan  (ENTRESTO ) 24-26 MG TAKE 1 TABLET BY MOUTH TWICE A DAY    simvastatin  (ZOCOR ) 10 MG tablet TAKE 1 TABLET BY MOUTH EVERYDAY AT BEDTIME   spironolactone  (ALDACTONE ) 25 MG tablet TAKE 1 TABLET BY MOUTH EVERY DAY   torsemide  (DEMADEX ) 20 MG tablet Take 1 tablet (20 mg total) by mouth in the morning AND 0.5 tablets (10 mg total) every evening.   warfarin (COUMADIN ) 2.5 MG tablet Take 1/2 tablet to 1 tablet by mouth daily as directed by the Anticoagulation Clinic.       03/19/2024    8:23 AM 12/09/2023   10:05 AM 04/09/2023    8:31 AM 01/24/2023    3:33 PM  GAD 7 : Generalized Anxiety Score  Nervous, Anxious, on Edge 0 0 0 0  Control/stop worrying 0 0 0 0  Worry too much - different things 0 0 0 0  Trouble relaxing 0 0 0 0  Restless 0 0 0 0  Easily annoyed or irritable 0 0 0 0  Afraid - awful might happen 0 0 0 0  Total GAD 7 Score 0 0 0 0  Anxiety Difficulty Not difficult at all Not difficult at all Not difficult at all Not difficult at all       03/19/2024    8:23 AM 12/09/2023   10:05 AM 11/27/2023   11:33 AM  Depression screen PHQ 2/9  Decreased Interest 0 0 0  Down, Depressed, Hopeless 0 0 0  PHQ - 2 Score 0 0 0  Altered sleeping 0 0 0  Tired, decreased energy 1 1 0  Change in appetite 0 0 0  Feeling bad or failure about yourself  0 0 0  Trouble concentrating 0 0 0  Moving slowly or fidgety/restless  0 0 0  Suicidal thoughts 0 0 0  PHQ-9 Score 1 1 0  Difficult doing work/chores Not difficult at all Not difficult at all Not difficult at all    BP Readings from Last 3 Encounters:  03/19/24 108/62  02/12/24 (!) 104/48  01/01/24 (!) 97/57    Physical Exam Vitals and nursing note reviewed.  Constitutional:      General: She is not in acute distress.    Appearance: Normal appearance. She is well-developed.  HENT:     Head: Normocephalic and atraumatic.  Neck:     Vascular: No carotid bruit.  Cardiovascular:     Rate and Rhythm: Normal rate and regular rhythm.  Pulmonary:     Effort: Pulmonary effort is normal.     Breath  sounds: No wheezing or rhonchi.  Musculoskeletal:     Cervical back: Normal range of motion.  Lymphadenopathy:     Cervical: No cervical adenopathy.  Skin:    General: Skin is warm and dry.     Findings: No rash.  Neurological:     Mental Status: She is alert and oriented to person, place, and time.  Psychiatric:        Mood and Affect: Mood normal.        Behavior: Behavior normal.     Wt Readings from Last 3 Encounters:  03/19/24 101 lb 9.6 oz (46.1 kg)  02/12/24 111 lb 2 oz (50.4 kg)  01/01/24 102 lb (46.3 kg)    BP 108/62   Pulse (!) 104   Ht 5' 1 (1.549 m)   Wt 101 lb 9.6 oz (46.1 kg)   SpO2 96%   BMI 19.20 kg/m   Assessment and Plan:  Problem List Items Addressed This Visit       Unprioritized   Essential hypertension - Primary (Chronic)   Blood pressure is well controlled.  Current medications - Enstresto, spironolactone , bisoprolol  Will continue same regimen along with efforts to limit dietary sodium.       Adult hypothyroidism (Chronic)   Supplemented Had lost some weight but has gained it back Appetite is good.      Stage 4 very severe COPD by GOLD classification (HCC) (Chronic)   On chronic O2 On Duonebs      Acquired thrombophilia (HCC) (Chronic)   On Warfarin chronically - INR checked by cardiology No bleeding issues.      Stage 3a chronic kidney disease (CKD) (HCC) (Chronic)   Recently checked by Cardiology  GFR 52      Chronic respiratory failure with hypoxia (HCC)   On O2 around the clock with Duonebs q 6 h. Followed by Pulmonary - Dr. Isaiah       Return in about 6 months (around 09/19/2024) for HTN, thyroid  DR. Lemon.    Leita HILARIO Adie, MD [facility] [place]

## 2024-03-19 NOTE — Assessment & Plan Note (Signed)
 On Warfarin chronically - INR checked by cardiology No bleeding issues.

## 2024-03-19 NOTE — Assessment & Plan Note (Signed)
 On chronic O2 On Duonebs

## 2024-03-19 NOTE — Assessment & Plan Note (Signed)
 Recently checked by Cardiology  GFR 52

## 2024-03-19 NOTE — Assessment & Plan Note (Signed)
 On O2 around the clock with Duonebs q 6 h. Followed by Pulmonary - Dr. Isaiah

## 2024-03-19 NOTE — Assessment & Plan Note (Signed)
 Supplemented Had lost some weight but has gained it back Appetite is good.

## 2024-04-12 ENCOUNTER — Encounter: Payer: Self-pay | Admitting: Internal Medicine

## 2024-04-14 ENCOUNTER — Ambulatory Visit: Attending: Cardiovascular Disease

## 2024-04-14 DIAGNOSIS — Z953 Presence of xenogenic heart valve: Secondary | ICD-10-CM | POA: Diagnosis present

## 2024-04-14 DIAGNOSIS — Z5181 Encounter for therapeutic drug level monitoring: Secondary | ICD-10-CM | POA: Insufficient documentation

## 2024-04-14 DIAGNOSIS — I340 Nonrheumatic mitral (valve) insufficiency: Secondary | ICD-10-CM | POA: Insufficient documentation

## 2024-04-14 LAB — POCT INR: INR: 2.1 (ref 2.0–3.0)

## 2024-04-14 NOTE — Patient Instructions (Signed)
 Take 1 tablet today only then Continue 1 tablet daily, except 0.5 tablet on Monday, Wednesday and Friday. - Recheck INR in 6 weeks. [phone]

## 2024-05-26 ENCOUNTER — Ambulatory Visit: Attending: Cardiovascular Disease

## 2024-05-26 DIAGNOSIS — Z5181 Encounter for therapeutic drug level monitoring: Secondary | ICD-10-CM | POA: Insufficient documentation

## 2024-05-26 DIAGNOSIS — I340 Nonrheumatic mitral (valve) insufficiency: Secondary | ICD-10-CM | POA: Diagnosis present

## 2024-05-26 DIAGNOSIS — Z953 Presence of xenogenic heart valve: Secondary | ICD-10-CM | POA: Diagnosis present

## 2024-05-26 LAB — POCT INR: INR: 2.2 (ref 2.0–3.0)

## 2024-05-26 NOTE — Patient Instructions (Signed)
 Continue 1 tablet daily, except 0.5 tablet on Monday, Wednesday and Friday. - Recheck INR in 6 weeks. [phone]

## 2024-05-28 ENCOUNTER — Other Ambulatory Visit: Payer: Self-pay | Admitting: Internal Medicine

## 2024-05-28 DIAGNOSIS — M5432 Sciatica, left side: Secondary | ICD-10-CM

## 2024-05-28 DIAGNOSIS — K219 Gastro-esophageal reflux disease without esophagitis: Secondary | ICD-10-CM

## 2024-05-28 NOTE — Telephone Encounter (Signed)
 Requested Prescriptions  Pending Prescriptions Disp Refills   pantoprazole  (PROTONIX ) 40 MG tablet [Pharmacy Med Name: PANTOPRAZOLE  SOD DR 40 MG TAB] 180 tablet 1    Sig: TAKE 1 TABLET BY MOUTH TWICE A DAY     Gastroenterology: Proton Pump Inhibitors Passed - 05/28/2024  3:58 PM      Passed - Valid encounter within last 12 months    Recent Outpatient Visits           2 months ago Essential hypertension   [place] [facility] at [facility], Leita DEL, MD   5 months ago S/P heart valve replacement with bioprosthetic valve   [place] [facility] at [facility], Leita DEL, MD               gabapentin  (NEURONTIN ) 100 MG capsule [Pharmacy Med Name: GABAPENTIN  100 MG CAPSULE] 180 capsule 1    Sig: TAKE 2 CAPSULES BY MOUTH AT BEDTIME.     Neurology: Anticonvulsants - gabapentin  Failed - 05/28/2024  3:58 PM      Failed - Cr in normal range and within 360 days    Creatinine, Ser  Date Value Ref Range Status  02/12/2024 1.04 (H) 0.57 - 1.00 mg/dL Final         Passed - Completed PHQ-2 or PHQ-9 in the last 360 days      Passed - Valid encounter within last 12 months    Recent Outpatient Visits           2 months ago Essential hypertension   [place] [facility] at [facility], Leita DEL, MD   5 months ago S/P heart valve replacement with bioprosthetic valve   [facility] at [facility], Leita DEL, MD

## 2024-06-03 ENCOUNTER — Ambulatory Visit: Payer: Medicare Other

## 2024-06-03 DIAGNOSIS — I428 Other cardiomyopathies: Secondary | ICD-10-CM | POA: Diagnosis not present

## 2024-06-03 LAB — CUP PACEART REMOTE DEVICE CHECK
Battery Remaining Longevity: 4 mo
Battery Voltage: 2.86 V
Brady Statistic AP VP Percent: 80.36 %
Brady Statistic AP VS Percent: 0.01 %
Brady Statistic AS VP Percent: 14.78 %
Brady Statistic AS VS Percent: 4.84 %
Brady Statistic RA Percent Paced: 80.02 %
Brady Statistic RV Percent Paced: 94.72 %
Date Time Interrogation Session: 20250925080820
Implantable Lead Connection Status: 753985
Implantable Lead Connection Status: 753985
Implantable Lead Implant Date: 20160823
Implantable Lead Implant Date: 20160823
Implantable Lead Location: 753859
Implantable Lead Location: 753860
Implantable Lead Model: 5076
Implantable Lead Model: 5076
Implantable Pulse Generator Implant Date: 20160823
Lead Channel Impedance Value: 304 Ohm
Lead Channel Impedance Value: 342 Ohm
Lead Channel Impedance Value: 342 Ohm
Lead Channel Impedance Value: 380 Ohm
Lead Channel Pacing Threshold Amplitude: 0.5 V
Lead Channel Pacing Threshold Amplitude: 0.5 V
Lead Channel Pacing Threshold Pulse Width: 0.4 ms
Lead Channel Pacing Threshold Pulse Width: 0.4 ms
Lead Channel Sensing Intrinsic Amplitude: 1 mV
Lead Channel Sensing Intrinsic Amplitude: 1 mV
Lead Channel Sensing Intrinsic Amplitude: 9.75 mV
Lead Channel Sensing Intrinsic Amplitude: 9.75 mV
Lead Channel Setting Pacing Amplitude: 1.5 V
Lead Channel Setting Pacing Amplitude: 2 V
Lead Channel Setting Pacing Pulse Width: 0.4 ms
Lead Channel Setting Sensing Sensitivity: 2.8 mV
Zone Setting Status: 755011

## 2024-06-03 NOTE — Progress Notes (Signed)
 Remote pacemaker transmission.

## 2024-06-05 ENCOUNTER — Ambulatory Visit: Payer: Self-pay | Admitting: Cardiology

## 2024-06-07 NOTE — Progress Notes (Signed)
 Remote PPM Transmission

## 2024-06-11 ENCOUNTER — Encounter: Admitting: Cardiology

## 2024-06-21 NOTE — Progress Notes (Unsigned)
 Electrophysiology Clinic Note    Date:  06/22/2024  Patient ID:  Ridhima, Golberg [birth year], MRN [id number] PCP:  Justus Leita DEL, MD  Cardiologist:  Deatrice Cage, MD  Cardiology APP:  Franchester Mikey DEL, PA-C  Electrophysiologist:  Fonda Kitty, MD  Electrophysiology APP:  Mialee Weyman, NP     Discussed the use of AI scribe software for clinical note transcription with the patient, who gave verbal consent to proceed.   Patient Profile    Chief Complaint: device, AFib/flutter follow-up  History of Present Illness: [name] is a 89 y.o. female with PMH notable for CAD, MR, TR s/p MVR, TVr, CABG, MAZE c/b CHB post-op s/p PPM, HFrEF, s/p attempted BiV upgrade (unsuccessful), persis AFib, aflutter, pulm HTN, COPD on home Os, hypothyroid, Meniere's disease, deafness,  ; seen today for Fonda Kitty, MD (former Dr. Fernande patient) for routine electrophysiology followup.   She is on warfarin for stroke ppx for afib. Maintaining sinus rhythm on amiodarone  200mg  5 days a week.  She last saw Dr. Fernande 08/2023 at which point she was doing stable without AFib, planned to reduce amio to 100mg  daily if no AFib at next follow-up appt.  She saw general cardiology several times in the interim, lasix  adjusted to torsemide . She last saw PA Furth 02/2024 where she was stable from fluid standpoint.   On follow-up today, she believes she has had multiple episodes of Afib that last 2-3 days every other week or so. Main symptom is palpitations and SOB. She continues to take amiodarone  200mg  5 days a week (skips Tuesday and Friday). Her breathing has worsened slightly since last being seen, now requires 2.5L O2. She says her increased SOB is variable based on the weather - if there is more wind her breathing is worse.  She denies chest pain, chest pressure, palpitations today. She does have increased edema today, continues to take 20mg  torsemide  in AM, 10mg  in PM. She thinks she needs to take 20mg   BID.   No bleeding concerns on warfarin.      Arrhythmia/Device History MDT Dual chamber PPM, imp 2016; dx CHB  - unsuccessful BiV upgrade w CS lead 2020  AAD -  Amiodarone     ROS:  Please see the history of present illness. All other systems are reviewed and otherwise negative.    Physical Exam    VS:  BP 122/64 (BP Location: Left Arm)   Pulse 71   Ht 5' 1 (1.549 m)   Wt 102 lb 6.4 oz (46.4 kg)   BMI 19.35 kg/m  BMI: Body mass index is 19.35 kg/m.           Wt Readings from Last 3 Encounters:  06/22/24 102 lb 6.4 oz (46.4 kg)  03/19/24 101 lb 9.6 oz (46.1 kg)  02/12/24 111 lb 2 oz (50.4 kg)     GEN- The patient is chronically-ill appearing, alert and oriented x 3 today.   Lungs- diminished throughout, but clear to ausculation bilaterally, normal work of breathing. [place] in place Heart- Regular rate and rhythm, no murmurs, rubs or gallops Extremities- 1+ peripheral edema, warm, dry Skin-  device pocket well-healed, no tethering    Device interrogation done today and reviewed by myself:  Battery 5 months Lead thresholds, impedence, sensing stable  Significant AF episodes on 12/10/2023, minimal AF since Dependent down to VVI 40 No changes made today   Studies Reviewed   Previous EP, cardiology notes.    EKG is  ordered. Personal review of EKG from today shows:    EKG Interpretation Date/Time:  Tuesday June 22 2024 09:19:05 EDT Ventricular Rate:  71 PR Interval:  176 QRS Duration:  204 QT Interval:  528 QTC Calculation: 573 R Axis:   264  Text Interpretation: AV dual-paced rhythm Confirmed by Dillinger Aston [phone]) on 06/22/2024 9:27:47 AM     TTE, 07/01/2023  1. Left ventricular ejection fraction, by estimation, is 30 to 35%. Left ventricular ejection fraction by 3D volume is 33 %. The left ventricle has moderately decreased function. Inferior/lateral wall motion best preserved. The left ventricle demonstrates global hypokinesis. Left ventricular  diastolic parameters are indeterminate. The average left ventricular global longitudinal strain is -6.9 %. The global longitudinal strain is abnormal.   2. Right ventricular systolic function is moderately reduced. The right ventricular size is moderately enlarged. There is moderately elevated pulmonary artery systolic pressure. The estimated right ventricular systolic pressure is 56.2 mmHg.   3. Left atrial size was moderately dilated.   4. The mitral valve has been repaired/replaced. No evidence of mitral valve regurgitation. No evidence of mitral stenosis. The mean mitral valve gradient is 6.0 mmHg. S/p Mitral Valve: 27 mm [facility] Ease, bioprosthetic valve   5. The tricuspid valve is has been repaired/replaced. Tricuspid valve regurgitation is mild to moderate.   6. The aortic valve is tricuspid. Aortic valve regurgitation is mild. No aortic stenosis is present.   7. The inferior vena cava is dilated in size with >50% respiratory variability, suggesting right atrial pressure of 8 mmHg.  TTE, 02/27/2021  1. Left ventricular ejection fraction, by estimation, is 35 to 40%. The left ventricle has moderately decreased function. The left ventricle demonstrates global hypokinesis. The left ventricular internal cavity size was mildly dilated. Left ventricular diastolic function could not be evaluated.   2. Right ventricular systolic function is severely reduced. The right ventricular size is normal. There is severely elevated pulmonary artery systolic pressure.   3. Left atrial size was mild to moderately dilated.   4. The mitral valve has been repaired/replaced. Trivial mitral valve regurgitation. The mean mitral valve gradient is 5.0 mmHg. There is a 27 mm [facility] Ease bioprosthetic valve present in the mitral position. Procedure Date: 2016.   5. The tricuspid valve is has been repaired/replaced.   6. The aortic valve has an indeterminant number of cusps. There is mild  thickening of the aortic valve. Aortic valve regurgitation is mild. No aortic stenosis is present.   7. The inferior vena cava is normal in size with greater than 50% respiratory variability, suggesting right atrial pressure of 3 mmHg.    Assessment and Plan     #) CHB s/p PPM Device functioning well, see paceart for details Dependent with high VP with wide QRS Consider LB-area lead implantation at generator change, will defer to MD    #) persis afib, aflutter #) amiodarone  monitoring Overall low AFib burden Continue 200mg  amiodarone  5 days a week at this time Continue 2.5mg  bisoprolol  daily Update CMP, thyroid labs today   #) Hypercoag d/t  afib CHA2DS2-VASc Score = at least 6 [CHF History: 1, HTN History: 1, Diabetes History: 0, Stroke History: 0, Vascular Disease History: 1, Age Score: 2, Gender Score: 1].  Therefore, the patient's annual risk of stroke is 9.7 %.    Stroke ppx - warfarin, managed at [facility] office. Last several INRs therapeutic No bleeding concerns Update CBC  #) HFrEF Continues to have low LVEF,  most recently 30-35% Requiring slightly increased O2  Increased edema on exam, patient requests to increase torsemide  to 20mg  BID, which I think is reasonable Continue 2.5mg  bisoprolol , 24-26 entresto  BID, 25mg  spiro daily Increase torsemide  to 20mg  BID Update BNP     Current medicines are reviewed at length with the patient today.   The patient does not have concerns regarding her medicines.  The following changes were made today:   INCREASE torsemide  to 20mg  BID  Labs/ tests ordered today include:  Orders Placed This Encounter  Procedures   Pro b natriuretic peptide (BNP)   Comp Met (CMET)   CBC   TSH   T4, free   EKG 12-Lead     Disposition: Follow up with Dr. Kennyth  in 3 months to discuss device upgrade and gen change   Signed, Chantal Needle, NP  06/22/24  10:02 AM  Electrophysiology [facility]

## 2024-06-22 ENCOUNTER — Ambulatory Visit: Attending: Cardiology | Admitting: Cardiology

## 2024-06-22 VITALS — BP 122/64 | HR 71 | Ht 61.0 in | Wt 102.4 lb

## 2024-06-22 DIAGNOSIS — Z5181 Encounter for therapeutic drug level monitoring: Secondary | ICD-10-CM | POA: Diagnosis not present

## 2024-06-22 DIAGNOSIS — I442 Atrioventricular block, complete: Secondary | ICD-10-CM | POA: Diagnosis not present

## 2024-06-22 DIAGNOSIS — I5022 Chronic systolic (congestive) heart failure: Secondary | ICD-10-CM | POA: Insufficient documentation

## 2024-06-22 DIAGNOSIS — I4819 Other persistent atrial fibrillation: Secondary | ICD-10-CM | POA: Insufficient documentation

## 2024-06-22 DIAGNOSIS — Z79899 Other long term (current) drug therapy: Secondary | ICD-10-CM | POA: Diagnosis present

## 2024-06-22 DIAGNOSIS — Z950 Presence of cardiac pacemaker: Secondary | ICD-10-CM | POA: Insufficient documentation

## 2024-06-22 LAB — CUP PACEART INCLINIC DEVICE CHECK
Date Time Interrogation Session: 20251014101400
Implantable Lead Connection Status: 753985
Implantable Lead Connection Status: 753985
Implantable Lead Implant Date: 20160823
Implantable Lead Implant Date: 20160823
Implantable Lead Location: 753859
Implantable Lead Location: 753860
Implantable Lead Model: 5076
Implantable Lead Model: 5076
Implantable Pulse Generator Implant Date: 20160823

## 2024-06-22 MED ORDER — TORSEMIDE 20 MG PO TABS: 20.0000 mg | ORAL_TABLET | Freq: Two times a day (BID) | ORAL | 6 refills | Status: AC

## 2024-06-22 NOTE — Patient Instructions (Addendum)
 Medication Instructions:  Your physician has recommended you make the following change in your medication:  INCREASE Torsemide  to 20 mg TWICE daily  *If you need a refill on your cardiac medications before your next appointment, please call your pharmacy*  Lab Work: Today: CMP, BNP, CBC, TSH & free T4  If you have any lab test that is abnormal or we need to change your treatment, we will call you to review the results.   Follow-Up: At [facility], you and your health needs are our priority.  As part of our continuing mission to provide you with exceptional heart care, our providers are all part of one team.  This team includes your primary Cardiologist (physician) and Advanced Practice Providers or APPs (Physician Assistants and Nurse Practitioners) who all work together to provide you with the care you need, when you need it.  Your next appointment:   3 month(s)  Provider:   Fonda Kitty, MD    Thank you for choosing [facility]!!   [phone]

## 2024-06-23 ENCOUNTER — Ambulatory Visit: Payer: Self-pay | Admitting: Cardiology

## 2024-06-23 LAB — COMPREHENSIVE METABOLIC PANEL WITH GFR
ALT: 12 IU/L (ref 0–32)
AST: 14 IU/L (ref 0–40)
Albumin: 4.2 g/dL (ref 3.7–4.7)
Alkaline Phosphatase: 88 IU/L (ref 48–129)
BUN/Creatinine Ratio: 22 (ref 12–28)
BUN: 21 mg/dL (ref 8–27)
Bilirubin Total: 0.3 mg/dL (ref 0.0–1.2)
CO2: 31 mmol/L — ABNORMAL HIGH (ref 20–29)
Calcium: 9.4 mg/dL (ref 8.7–10.3)
Chloride: 98 mmol/L (ref 96–106)
Creatinine, Ser: 0.97 mg/dL (ref 0.57–1.00)
Globulin, Total: 2.4 g/dL (ref 1.5–4.5)
Glucose: 92 mg/dL (ref 70–99)
Potassium: 4.2 mmol/L (ref 3.5–5.2)
Sodium: 147 mmol/L — ABNORMAL HIGH (ref 134–144)
Total Protein: 6.6 g/dL (ref 6.0–8.5)
eGFR: 57 mL/min/1.73 — ABNORMAL LOW (ref >59)

## 2024-06-23 LAB — CBC
Hematocrit: 32.9 % — ABNORMAL LOW (ref 34.0–46.6)
Hemoglobin: 10.4 g/dL — ABNORMAL LOW (ref 11.1–15.9)
MCH: 31.6 pg (ref 26.6–33.0)
MCHC: 31.6 g/dL (ref 31.5–35.7)
MCV: 100 fL — ABNORMAL HIGH (ref 79–97)
Platelets: 155 x10E3/uL (ref 150–450)
RBC: 3.29 x10E6/uL — ABNORMAL LOW (ref 3.77–5.28)
RDW: 13.1 % (ref 11.7–15.4)
WBC: 7 x10E3/uL (ref 3.4–10.8)

## 2024-06-23 LAB — T4, FREE: Free T4: 1.94 ng/dL — ABNORMAL HIGH (ref 0.82–1.77)

## 2024-07-05 ENCOUNTER — Ambulatory Visit: Attending: Cardiology

## 2024-07-05 ENCOUNTER — Encounter

## 2024-07-06 LAB — CUP PACEART REMOTE DEVICE CHECK
Battery Remaining Longevity: 4 mo
Battery Voltage: 2.85 V
Brady Statistic AP VP Percent: 61.6 %
Brady Statistic AP VS Percent: 0.01 %
Brady Statistic AS VP Percent: 17.22 %
Brady Statistic AS VS Percent: 21.17 %
Brady Statistic RA Percent Paced: 61.33 %
Brady Statistic RV Percent Paced: 78.43 %
Date Time Interrogation Session: 20251028113501
Implantable Lead Connection Status: 753985
Implantable Lead Connection Status: 753985
Implantable Lead Implant Date: 20160823
Implantable Lead Implant Date: 20160823
Implantable Lead Location: 753859
Implantable Lead Location: 753860
Implantable Lead Model: 5076
Implantable Lead Model: 5076
Implantable Pulse Generator Implant Date: 20160823
Lead Channel Impedance Value: 304 Ohm
Lead Channel Impedance Value: 342 Ohm
Lead Channel Impedance Value: 361 Ohm
Lead Channel Impedance Value: 380 Ohm
Lead Channel Pacing Threshold Amplitude: 0.625 V
Lead Channel Pacing Threshold Amplitude: 0.625 V
Lead Channel Pacing Threshold Pulse Width: 0.4 ms
Lead Channel Pacing Threshold Pulse Width: 0.4 ms
Lead Channel Sensing Intrinsic Amplitude: 0.875 mV
Lead Channel Sensing Intrinsic Amplitude: 0.875 mV
Lead Channel Sensing Intrinsic Amplitude: 10 mV
Lead Channel Sensing Intrinsic Amplitude: 10 mV
Lead Channel Setting Pacing Amplitude: 1.5 V
Lead Channel Setting Pacing Amplitude: 2 V
Lead Channel Setting Pacing Pulse Width: 0.4 ms
Lead Channel Setting Sensing Sensitivity: 2.8 mV
Zone Setting Status: 755011

## 2024-07-07 ENCOUNTER — Ambulatory Visit: Attending: Cardiovascular Disease

## 2024-07-07 DIAGNOSIS — I340 Nonrheumatic mitral (valve) insufficiency: Secondary | ICD-10-CM | POA: Insufficient documentation

## 2024-07-07 DIAGNOSIS — Z953 Presence of xenogenic heart valve: Secondary | ICD-10-CM | POA: Insufficient documentation

## 2024-07-07 DIAGNOSIS — Z5181 Encounter for therapeutic drug level monitoring: Secondary | ICD-10-CM | POA: Insufficient documentation

## 2024-07-07 LAB — POCT INR: INR: 3.3 — AB (ref 2.0–3.0)

## 2024-07-07 NOTE — Patient Instructions (Signed)
 Continue 1 tablet daily, except 0.5 tablet on Monday, Wednesday and Friday. - Recheck INR in 6 weeks. [phone]  Eat greens tonight

## 2024-07-11 ENCOUNTER — Ambulatory Visit: Payer: Self-pay | Admitting: Cardiology

## 2024-07-17 ENCOUNTER — Other Ambulatory Visit: Payer: Self-pay | Admitting: Cardiovascular Disease

## 2024-07-17 DIAGNOSIS — Z953 Presence of xenogenic heart valve: Secondary | ICD-10-CM

## 2024-07-19 ENCOUNTER — Ambulatory Visit: Payer: Self-pay | Admitting: Cardiology

## 2024-07-19 NOTE — Telephone Encounter (Signed)
 Refill request for warfarin:  Last INR was 3.3 on 07/07/24 Next INR due 08/18/24 LOV was 06/22/24  Refill approved.

## 2024-07-29 ENCOUNTER — Other Ambulatory Visit: Payer: Self-pay | Admitting: Cardiovascular Disease

## 2024-08-05 ENCOUNTER — Encounter

## 2024-08-05 ENCOUNTER — Ambulatory Visit: Attending: Internal Medicine

## 2024-08-09 ENCOUNTER — Telehealth: Payer: Self-pay

## 2024-08-09 NOTE — Telephone Encounter (Signed)
 Spoke with pt husband and pt needs a new monitor. Monitor has been ordered and will take 7-10 business days. Husband is worried about battery but I reassured she will be fine

## 2024-08-13 ENCOUNTER — Ambulatory Visit: Attending: Cardiology

## 2024-08-15 ENCOUNTER — Other Ambulatory Visit: Payer: Self-pay | Admitting: Internal Medicine

## 2024-08-15 DIAGNOSIS — E039 Hypothyroidism, unspecified: Secondary | ICD-10-CM

## 2024-08-16 LAB — CUP PACEART REMOTE DEVICE CHECK
Battery Remaining Longevity: 2 mo
Battery Voltage: 2.84 V
Brady Statistic AP VP Percent: 76.8 %
Brady Statistic AP VS Percent: 0.02 %
Brady Statistic AS VP Percent: 13.36 %
Brady Statistic AS VS Percent: 9.81 %
Brady Statistic RA Percent Paced: 76.42 %
Brady Statistic RV Percent Paced: 89.71 %
Date Time Interrogation Session: 20251205155809
Implantable Lead Connection Status: 753985
Implantable Lead Connection Status: 753985
Implantable Lead Implant Date: 20160823
Implantable Lead Implant Date: 20160823
Implantable Lead Location: 753859
Implantable Lead Location: 753860
Implantable Lead Model: 5076
Implantable Lead Model: 5076
Implantable Pulse Generator Implant Date: 20160823
Lead Channel Impedance Value: 304 Ohm
Lead Channel Impedance Value: 323 Ohm
Lead Channel Impedance Value: 342 Ohm
Lead Channel Impedance Value: 361 Ohm
Lead Channel Pacing Threshold Amplitude: 0.5 V
Lead Channel Pacing Threshold Amplitude: 0.625 V
Lead Channel Pacing Threshold Pulse Width: 0.4 ms
Lead Channel Pacing Threshold Pulse Width: 0.4 ms
Lead Channel Sensing Intrinsic Amplitude: 0.875 mV
Lead Channel Sensing Intrinsic Amplitude: 0.875 mV
Lead Channel Sensing Intrinsic Amplitude: 8.75 mV
Lead Channel Sensing Intrinsic Amplitude: 8.75 mV
Lead Channel Setting Pacing Amplitude: 1.5 V
Lead Channel Setting Pacing Amplitude: 2 V
Lead Channel Setting Pacing Pulse Width: 0.4 ms
Lead Channel Setting Sensing Sensitivity: 2.8 mV
Zone Setting Status: 755011

## 2024-08-17 NOTE — Telephone Encounter (Signed)
 Requested Prescriptions  Pending Prescriptions Disp Refills   levothyroxine  (SYNTHROID ) 88 MCG tablet [Pharmacy Med Name: LEVOTHYROXINE  88 MCG TABLET] 90 tablet 0    Sig: TAKE 1 TABLET BY MOUTH DAILY BEFORE BREAKFAST.     Endocrinology:  Hypothyroid Agents Failed - 08/17/2024  2:45 PM      Failed - TSH in normal range and within 360 days    TSH  Date Value Ref Range Status  08/19/2023 0.748 0.450 - 4.500 uIU/mL Final         Passed - Valid encounter within last 12 months    Recent Outpatient Visits           5 months ago Essential hypertension   [facility], Leita DEL, MD   8 months ago S/P heart valve replacement with bioprosthetic valve   [facility] at [facility], Leita DEL, MD

## 2024-08-18 ENCOUNTER — Ambulatory Visit: Attending: Cardiovascular Disease

## 2024-08-18 DIAGNOSIS — I340 Nonrheumatic mitral (valve) insufficiency: Secondary | ICD-10-CM | POA: Insufficient documentation

## 2024-08-18 DIAGNOSIS — Z5181 Encounter for therapeutic drug level monitoring: Secondary | ICD-10-CM | POA: Diagnosis present

## 2024-08-18 DIAGNOSIS — Z953 Presence of xenogenic heart valve: Secondary | ICD-10-CM | POA: Insufficient documentation

## 2024-08-18 LAB — POCT INR: INR: 3.2 — AB (ref 2.0–3.0)

## 2024-08-18 NOTE — Patient Instructions (Signed)
 Continue 1 tablet daily, except 0.5 tablet on Monday, Wednesday and Friday. - Recheck INR in 6 weeks. [phone]

## 2024-08-24 ENCOUNTER — Ambulatory Visit: Admitting: Medical

## 2024-08-24 ENCOUNTER — Encounter: Payer: Self-pay | Admitting: Medical

## 2024-08-24 VITALS — BP 102/51 | HR 80 | Ht 61.0 in | Wt 102.6 lb

## 2024-08-24 DIAGNOSIS — Z950 Presence of cardiac pacemaker: Secondary | ICD-10-CM | POA: Insufficient documentation

## 2024-08-24 DIAGNOSIS — I2510 Atherosclerotic heart disease of native coronary artery without angina pectoris: Secondary | ICD-10-CM | POA: Insufficient documentation

## 2024-08-24 DIAGNOSIS — I340 Nonrheumatic mitral (valve) insufficiency: Secondary | ICD-10-CM | POA: Diagnosis present

## 2024-08-24 DIAGNOSIS — Z953 Presence of xenogenic heart valve: Secondary | ICD-10-CM | POA: Diagnosis not present

## 2024-08-24 DIAGNOSIS — I4819 Other persistent atrial fibrillation: Secondary | ICD-10-CM | POA: Diagnosis present

## 2024-08-24 DIAGNOSIS — I5023 Acute on chronic systolic (congestive) heart failure: Secondary | ICD-10-CM | POA: Diagnosis not present

## 2024-08-24 NOTE — Patient Instructions (Signed)
 Medication Instructions:  Your physician recommends that you continue on your current medications as directed. Please refer to the Current Medication list given to you today.    *If you need a refill on your cardiac medications before your next appointment, please call your pharmacy*  Lab Work: No labs ordered today    Testing/Procedures: No test ordered today   Follow-Up: At [facility], you and your health needs are our priority.  As part of our continuing mission to provide you with exceptional heart care, our providers are all part of one team.  This team includes your primary Cardiologist (physician) and Advanced Practice Providers or APPs (Physician Assistants and Nurse Practitioners) who all work together to provide you with the care you need, when you need it.  Your next appointment:   1 month(s)  Provider:   Suzann Riddle, NP    Your physician recommends that you schedule a follow-up appointment in 6 months with Cadence Franchester, [place]

## 2024-08-24 NOTE — Progress Notes (Unsigned)
 Cardiology Office Note   Date:  08/26/2024  ID:  [name], DOB 10/15/1934, MRN [id number] PCP: Justus Leita DEL, MD  [facility] Providers Cardiologist:  Deatrice Cage, MD Cardiology APP:  Fr[place] Mikey DEL, PA-C  Electrophysiologist:  Fonda Kitty, MD  Electrophysiology APP:  Riddle, Suzann, NP   History of Present Illness [name] is a 89 y.o. 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    She underwent mitral valve replacement with a bioprosthetic valve, tricuspid valve repair, one-vessel CABG with LIMA to LAD and maze procedure in August 2016. This was complicated by sternal wound infection which required debridement. She also had complete heart block and underwent permanent pacemaker placement.   In 08/2017, she had worsening heart failure.  Echocardiogram showed an EF of 30 to 35%. She was seen by EP and it was felt that LV dysfunction was in the context of RV apical pacing. That was inactivated and she noted immediate improvement in symptoms. She had worsening SOB in 2019 and echo was ordered, which showed a drop in EF 20-25% in the setting of afib with RVR. She was rate controlled and started on amiodarone  and subsequently underwent cardioversion with restoration of NSR. EF improved to 35-40% after that.    Right heart catheterization was done in June 2020 which showed moderately elevated filling pressures, severe pulmonary hypertension and mildly reduced cardiac output.  There was no evidence of left-to-right intracardiac shunting by saturation run.  Prominent V waves were noted on pulmonary capillary wedge pressure tracing suggestive of significant mitral  regurgitation.  Symptoms improved after increasing furosemide .   She underwent attempted upgrade of her pacemaker to a biventricular device but the procedure was not successful due to inability to place an LV lead. She had recurrent atrial fibrillation in January 2021 that required cardioversion.  She has been in sinus rhythm since then.  The patient was last seen 06/22/2024 by EP. Torsemide  was increased for lower leg edema.  Today, the patient reports she is doing well from a cardiac perspective. She is due for pacemaker change-out. She has stable and mild lower leg edema. She takes torsemide  20mg  BID. She denies chest pain or SOB.  Studies Reviewed      Echo 06/2023  1. Left ventricular ejection fraction, by estimation, is 30 to 35%. Left  ventricular ejection fraction by 3D volume is 33 %. The left ventricle has  moderately decreased function. Inferior/lateral wall motion best  preserved. The left ventricle  demonstrates global hypokinesis. Left ventricular diastolic parameters are  indeterminate. The average left ventricular global longitudinal strain is  -6.9 %. The global longitudinal strain is abnormal.   2. Right ventricular systolic function is moderately reduced. The right  ventricular size is moderately enlarged. There is moderately elevated  pulmonary artery systolic pressure. The estimated right ventricular  systolic pressure is 56.2 mmHg.   3. Left atrial size was moderately dilated.   4. The mitral valve has been repaired/replaced. No evidence of mitral  valve regurgitation. No evidence of mitral stenosis. The mean mitral valve  gradient is 6.0 mmHg. S/p Mitral Valve: 27 mm [facility]  Ease,     bioprosthetic valve  5. The tricuspid valve is has been repaired/replaced. Tricuspid valve  regurgitation is mild to moderate.   6. The aortic valve is tricuspid. Aortic valve regurgitation is mild. No  aortic stenosis is present.   7. The inferior vena cava is  dilated in size with >50% respiratory  variability, suggesting right atrial pressure of 8 mmHg.    Echo 2022  1. Left ventricular ejection fraction, by estimation, is 35 to 40%. The  left ventricle has moderately decreased function. The left ventricle  demonstrates global hypokinesis. The left ventricular internal cavity size  was mildly dilated. Left ventricular  diastolic function could not be evaluated.   2. Right ventricular systolic function is severely reduced. The right  ventricular size is normal. There is severely elevated pulmonary artery  systolic pressure.   3. Left atrial size was mild to moderately dilated.   4. The mitral valve has been repaired/replaced. Trivial mitral valve  regurgitation. The mean mitral valve gradient is 5.0 mmHg. There is a 27  mm [facility] Ease bioprosthetic valve present in the mitral  position. Procedure Date: 2016.   5. The tricuspid valve is has been repaired/replaced.   6. The aortic valve has an indeterminant number of cusps. There is mild  thickening of the aortic valve. Aortic valve regurgitation is mild. No  aortic stenosis is present.   7. The inferior vena cava is normal in size with greater than 50%  respiratory variability, suggesting right atrial pressure of 3 mmHg.       Physical Exam VS:  BP (!) 102/51 (BP Location: Left Arm, Patient Position: Sitting, Cuff Size: Small)   Pulse 80   Ht 5' 1 (1.549 m)   Wt 102 lb 9.6 oz (46.5 kg)   SpO2 99%   BMI 19.39 kg/m        Wt Readings from Last 3 Encounters:  08/24/24 102 lb 9.6 oz (46.5 kg)  06/22/24 102 lb 6.4 oz (46.4 kg)  03/19/24 101 lb 9.6 oz (46.1 kg)    GEN: Well nourished, well developed in no acute distress NECK: No JVD; No carotid bruits CARDIAC: RRR, no murmurs, rubs, gallops RESPIRATORY:  Clear to auscultation without rales, wheezing or rhonchi  ABDOMEN: Soft, non-tender, non-distended EXTREMITIES:  mild lower leg edema; No deformity   ASSESSMENT AND  PLAN  Acute on chronic systolic heart failure Patient has mild lower leg edema on exam.  She takes torsemide  40 mg daily.  Recommended she take extra torsemide  as needed.  S/p MV replacement with bioprosthetic valve Most recent echocardiogram 06/2023 showed normally functioning valve.  We can repeat echocardiogram at follow-up.  CAD s/p 1V CAD with LIMA to LAD Patient denies anginal symptoms.  Continue bisoprolol  and simvastatin .  Persistent Afib Continue amiodarone  200 mg 5 days a week, bisoprolol  2.5mg  daily and warfarin.  S/p PPM Patient needs a battery change out, we will set her up with EP.    Dispo: Follow-up in 6 months with general cardiology  Signed, Destanie Tibbetts VEAR Fishman, PA-C

## 2024-08-26 ENCOUNTER — Ambulatory Visit: Payer: Self-pay | Admitting: Cardiology

## 2024-09-03 ENCOUNTER — Encounter

## 2024-09-05 ENCOUNTER — Other Ambulatory Visit: Payer: Self-pay | Admitting: Cardiovascular Disease

## 2024-09-05 ENCOUNTER — Ambulatory Visit

## 2024-09-06 ENCOUNTER — Encounter

## 2024-09-13 ENCOUNTER — Ambulatory Visit

## 2024-09-13 DIAGNOSIS — I428 Other cardiomyopathies: Secondary | ICD-10-CM

## 2024-09-13 LAB — CUP PACEART REMOTE DEVICE CHECK
Battery Remaining Longevity: 2 mo
Battery Voltage: 2.84 V
Brady Statistic AP VP Percent: 96.79 %
Brady Statistic AP VS Percent: 0.02 %
Brady Statistic AS VP Percent: 3.2 %
Brady Statistic AS VS Percent: 0 %
Brady Statistic RA Percent Paced: 96.73 %
Brady Statistic RV Percent Paced: 99.94 %
Date Time Interrogation Session: 20260105105454
Implantable Lead Connection Status: 753985
Implantable Lead Connection Status: 753985
Implantable Lead Implant Date: 20160823
Implantable Lead Implant Date: 20160823
Implantable Lead Location: 753859
Implantable Lead Location: 753860
Implantable Lead Model: 5076
Implantable Lead Model: 5076
Implantable Pulse Generator Implant Date: 20160823
Lead Channel Impedance Value: 304 Ohm
Lead Channel Impedance Value: 342 Ohm
Lead Channel Impedance Value: 342 Ohm
Lead Channel Impedance Value: 361 Ohm
Lead Channel Pacing Threshold Amplitude: 0.5 V
Lead Channel Pacing Threshold Amplitude: 0.5 V
Lead Channel Pacing Threshold Pulse Width: 0.4 ms
Lead Channel Pacing Threshold Pulse Width: 0.4 ms
Lead Channel Sensing Intrinsic Amplitude: 1 mV
Lead Channel Sensing Intrinsic Amplitude: 1 mV
Lead Channel Sensing Intrinsic Amplitude: 8.625 mV
Lead Channel Sensing Intrinsic Amplitude: 8.625 mV
Lead Channel Setting Pacing Amplitude: 1.5 V
Lead Channel Setting Pacing Amplitude: 2 V
Lead Channel Setting Pacing Pulse Width: 0.4 ms
Lead Channel Setting Sensing Sensitivity: 2.8 mV
Zone Setting Status: 755011

## 2024-09-16 NOTE — Progress Notes (Signed)
 Remote PPM Transmission

## 2024-09-18 ENCOUNTER — Ambulatory Visit: Payer: Self-pay | Admitting: Cardiology

## 2024-09-24 ENCOUNTER — Ambulatory Visit: Admitting: Cardiology

## 2024-09-27 ENCOUNTER — Ambulatory Visit: Payer: Self-pay

## 2024-09-27 ENCOUNTER — Ambulatory Visit (INDEPENDENT_AMBULATORY_CARE_PROVIDER_SITE_OTHER): Admitting: Student

## 2024-09-27 ENCOUNTER — Encounter: Payer: Self-pay | Admitting: Student

## 2024-09-27 VITALS — BP 104/58 | HR 60 | Temp 97.8°F | Ht 61.0 in | Wt 101.0 lb

## 2024-09-27 DIAGNOSIS — L03116 Cellulitis of left lower limb: Secondary | ICD-10-CM

## 2024-09-27 MED ORDER — CEPHALEXIN 500 MG PO CAPS: 500.0000 mg | ORAL_CAPSULE | Freq: Four times a day (QID) | ORAL | 0 refills | Status: AC

## 2024-09-27 NOTE — Progress Notes (Signed)
 "  Established Patient Office Visit  Subjective   Patient ID: [name], female    DOB: [birth year]  Age: 89 y.o. MRN: [id number]  Chief Complaint  Patient presents with   Leg Swelling     leg swollen, first noticed 2 weeks ago, redness, using AD multipurpose ointment     [name] is a 89 y.o. person with medical hx listed below who presents today for ulceration of the LLE that started 3 weeks ago. First husband noted some redness of the left leg that has slowly worsened with time. Patient has poor vision and is unable to examine legs well. Notes some serosanguinous drainage. Has been using A+D cream without much benefit. Denies fever, chills, significant leg pain, numbness, tingling, or weakness. Denies trauma or bumping her leg on anything recently.   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 09/25/2023   Status cardiac pacemaker secondary to postoperative complete heart block 09/25/2023   Physical deconditioning/frailty 09/25/2023   Macular degeneration 12/10/2022   Chronic respiratory failure with hypoxia (HCC) 04/08/2022   Gastroesophageal reflux disease 02/19/2022   Sciatica of left side 08/21/2021   Aortoiliac occlusive disease (HCC) 05/22/2021   Cochlear implant in place 05/22/2021   Acquired thrombophilia 08/11/2020   Stage 3a chronic kidney disease (CKD) (HCC) 08/11/2020   Primary insomnia 07/31/2019   Osteoporosis, postmenopausal 01/23/2018   Coronary artery disease involving native coronary artery of native heart with angina pectoris 01/13/2017   S/P placement of cardiac pacemaker 05/02/15, medtronic 05/03/2015   Bradycardia 05/03/2015   Presence of cardiac pacemaker 05/03/2015   S/P  tricuspid valve repair 04/25/2015   Atrial fibrillation-persistent, with history of maze procedure 04/25/2015   CAD s/p CABGx1 04/25/2015   Pulmonary hypertension (HCC)    Chronic combined systolic and diastolic CHF (congestive heart failure) (HCC)    Chronic systolic heart failure (HCC) 11/10/2014   Essential hypertension 11/10/2014   Hyperlipidemia 11/10/2014   Prediabetes 05/08/2014   Auditory vertigo 05/08/2014   Adult hypothyroidism 05/08/2014   H/O adenomatous polyp of colon 05/08/2014   Stage 4 very severe COPD by GOLD classification (HCC) 03/28/2014      ROS Refer to HPI    Objective:     Outpatient Encounter Medications as of 09/27/2024  Medication Sig Note   acetaminophen  (TYLENOL ) 500 MG tablet Take 1,000 mg by mouth every 6 (six) hours as needed (for pain.).    amiodarone  (PACERONE ) 200 MG tablet TAKE 1 TABLET (200 MG) 5 DAYS PER WEEK    bisoprolol  (ZEBETA ) 5 MG tablet TAKE 1/2 TABLET BY MOUTH DAILY    [EXPIRED] cephALEXin  (KEFLEX ) 500 MG capsule Take 1 capsule (500 mg total) by mouth 4 (four) times daily for 7 days.    Cyanocobalamin (VITAMIN B-12 PO) Take 1,000 mcg by mouth daily.    diphenhydrAMINE  (BENADRYL ) 25 mg capsule Take 25 mg by mouth every 6 (six) hours as needed for allergies.    docusate sodium  (COLACE) 100 MG capsule Take 100 mg by mouth 2 (two) times daily as needed (constipation.).    feeding supplement (ENSURE ENLIVE / ENSURE PLUS) LIQD Take 237 mLs by mouth 2 (two) times daily between meals.    Ferrous Sulfate  (IRON PO) Take 1 tablet by mouth  daily.    gabapentin  (NEURONTIN ) 100 MG capsule TAKE 2 CAPSULES BY MOUTH AT BEDTIME.    ipratropium-albuterol  (DUONEB) 0.5-2.5 (3) MG/3ML SOLN Inhale 3 mLs into the lungs every 6 (six) hours as needed.    levothyroxine  (SYNTHROID ) 88 MCG tablet TAKE 1 TABLET BY MOUTH DAILY BEFORE BREAKFAST.    OXYGEN Inhale 2 L into the lungs continuous. 10/02/2023: Currently on 4 l/m   pantoprazole  (PROTONIX ) 40 MG tablet TAKE  1 TABLET BY MOUTH TWICE A DAY    simvastatin  (ZOCOR ) 10 MG tablet TAKE 1 TABLET BY MOUTH EVERYDAY AT BEDTIME    spironolactone  (ALDACTONE ) 25 MG tablet TAKE 1 TABLET BY MOUTH EVERY DAY    torsemide  (DEMADEX ) 20 MG tablet Take 1 tablet (20 mg total) by mouth 2 (two) times daily.    warfarin (COUMADIN ) 2.5 MG tablet TAKE 1/2 TABLET TO 1 TABLET BY MOUTH DAILY AS DIRECTED BY THE ANTICOAGULATION CLINIC.    [DISCONTINUED] sacubitril -valsartan  (ENTRESTO ) 24-26 MG TAKE 1 TABLET BY MOUTH TWICE A DAY    No facility-administered encounter medications on file as of 09/27/2024.    BP (!) 104/58   Pulse 60   Temp 97.8 F (36.6 C) (Oral)   Ht 5' 1 (1.549 m)   Wt 101 lb (45.8 kg)   SpO2 97%   BMI 19.08 kg/m  BP Readings from Last 3 Encounters:  09/27/24 (!) 104/58  08/24/24 (!) 102/51  06/22/24 122/64    Physical Exam Constitutional:      Appearance: Normal appearance.  HENT:     Mouth/Throat:     Mouth: Mucous membranes are moist.     Pharynx: Oropharynx is clear.  Cardiovascular:     Rate and Rhythm: Normal rate and regular rhythm.     Comments: Symmetric and palpable DP and PT pulses  Pulmonary:     Effort: Pulmonary effort is normal.     Breath sounds: No rhonchi (1+) or rales.  Abdominal:     General: Abdomen is flat. Bowel sounds are normal. There is no distension.     Palpations: Abdomen is soft.     Tenderness: There is no abdominal tenderness.  Musculoskeletal:        General: Normal range of motion.     Right lower leg: Edema (1+) present.     Left lower leg: Edema (1+ pitting) present.  Skin:    Capillary Refill: Capillary refill takes less than 2 seconds.     Comments: Shallow ulceration of the LLE with surrounding erythema, warmth, pitting edema, chronic venous stasis changes. See photo below  Neurological:     General: No focal deficit present.     Mental Status: She is alert and oriented to person, place, and time.  Psychiatric:        Mood and Affect: Mood normal.         Behavior: Behavior normal.         09/27/2024    2:03 PM 03/19/2024    8:23 AM 12/09/2023   10:05 AM  Depression screen PHQ 2/9  Decreased Interest 0 0 0  Down, Depressed, Hopeless 0 0 0  PHQ - 2 Score 0 0 0  Altered sleeping  0 0  Tired, decreased energy  1 1  Change in appetite  0 0  Feeling bad or failure about yourself   0 0  Trouble concentrating  0 0  Moving slowly or fidgety/restless  0 0  Suicidal thoughts  0 0  PHQ-9 Score  1  1   Difficult doing work/chores  Not difficult at all Not difficult at all     Data saved with a previous flowsheet row definition       09/27/2024    2:03 PM 03/19/2024    8:23 AM 12/09/2023   10:05 AM 04/09/2023    8:31 AM  GAD 7 : Generalized Anxiety Score  Nervous, Anxious, on Edge 0  0  0  0   Control/stop worrying 0  0  0  0   Worry too much - different things  0  0  0   Trouble relaxing  0  0  0   Restless  0  0  0   Easily annoyed or irritable  0  0  0   Afraid - awful might happen  0  0  0   Total GAD 7 Score  0 0 0  Anxiety Difficulty  Not difficult at all Not difficult at all Not difficult at all     Data saved with a previous flowsheet row definition    No results found for any visits on 09/27/24.  Last CBC Lab Results  Component Value Date   WBC 7.0 06/22/2024   HGB 10.4 (L) 06/22/2024   HCT 32.9 (L) 06/22/2024   MCV 100 (H) 06/22/2024   MCH 31.6 06/22/2024   RDW 13.1 06/22/2024   PLT 155 06/22/2024   Last metabolic panel Lab Results  Component Value Date   GLUCOSE 92 06/22/2024   NA 147 (H) 06/22/2024   K 4.2 06/22/2024   CL 98 06/22/2024   CO2 31 (H) 06/22/2024   BUN 21 06/22/2024   CREATININE 0.97 06/22/2024   EGFR 57 (L) 06/22/2024   CALCIUM  9.4 06/22/2024   PHOS 2.7 02/26/2019   PROT 6.6 06/22/2024   ALBUMIN  4.2 06/22/2024   LABGLOB 2.4 06/22/2024   AGRATIO 1.6 08/21/2022   BILITOT 0.3 06/22/2024   ALKPHOS 88 06/22/2024   AST 14 06/22/2024   ALT 12 06/22/2024   ANIONGAP 10 09/29/2023    Last lipids Lab Results  Component Value Date   CHOL 164 04/09/2023   HDL 61 04/09/2023   LDLCALC 86 04/09/2023   TRIG 92 04/09/2023   CHOLHDL 2.7 04/09/2023   Last hemoglobin A1c Lab Results  Component Value Date   HGBA1C 5.4 08/21/2022      The ASCVD Risk score (Arnett DK, et al., 2019) failed to calculate for the following reasons:   The 2019 ASCVD risk score is only valid for ages 34 to 104   * - Cholesterol units were assumed    Assessment & Plan:  Cellulitis of left lower extremity Assessment & Plan: Likely due to ulceration of the LLE. Has had difficulty with wound healing in the past requiring wound care. Normal ABIs in 2024. Will treat with keflex , she will discuss with coumadin  clinic regarding her coumadin  dosing.    Other orders -     Cephalexin ; Take 1 capsule (500 mg total) by mouth 4 (four) times daily for 7 days.  Dispense: 28 capsule; Refill: 0     Return in about 1 week (around 10/04/2024) for Wound check.    Harlene Saddler, MD "

## 2024-09-27 NOTE — Telephone Encounter (Signed)
Noted  Pt has an appt  KP 

## 2024-09-27 NOTE — Telephone Encounter (Signed)
" °  FYI Only or Action Required?: FYI only for provider: appointment scheduled on 09/27/24.  Patient was last seen in primary care on 03/19/2024 by Justus Leita DEL, MD.  Called Nurse Triage reporting Leg Pain.  Symptoms began a week ago.   Symptoms are: gradually worsening.  Triage Disposition: See Physician Within 24 Hours  Patient/caregiver understands and will follow disposition?: yes     Message from Ameerah G sent at 09/27/2024 12:08 PM EST  Reason for Triage: pain and swelling that's getting worse   Reason for Disposition  Looks like a boil, infected sore, deep ulcer or other infected rash (spreading redness, pus)  Answer Assessment - Initial Assessment Questions 1. ONSET: When did the pain start?      1 week  2. LOCATION: Where is the pain located?      Left mid calf to ankle  3. PAIN: How bad is the pain?    (Scale 1-10; or mild, moderate, severe)     5/10 4. WORK OR EXERCISE: Has there been any recent work or exercise that involved this part of the body?      no 5. CAUSE: What do you think is causing the leg pain?     Sores with pus pockets on calf x 4-5  6. OTHER SYMPTOMS: Do you have any other symptoms? (e.g., chest pain, back pain, breathing difficulty, swelling, rash, fever, numbness, weakness)     Swelling, scabbed sores (infected per husband-red - pus pockets 4-5, 12 inches up  Protocols used: Leg Pain-A-AH  "

## 2024-09-29 ENCOUNTER — Ambulatory Visit: Attending: Cardiovascular Disease

## 2024-09-29 DIAGNOSIS — Z953 Presence of xenogenic heart valve: Secondary | ICD-10-CM | POA: Diagnosis present

## 2024-09-29 DIAGNOSIS — I340 Nonrheumatic mitral (valve) insufficiency: Secondary | ICD-10-CM | POA: Insufficient documentation

## 2024-09-29 DIAGNOSIS — Z5181 Encounter for therapeutic drug level monitoring: Secondary | ICD-10-CM | POA: Diagnosis present

## 2024-09-29 LAB — POCT INR: INR: 4 — AB (ref 2.0–3.0)

## 2024-09-29 NOTE — Patient Instructions (Signed)
 While on Keflex , Hold today and tomorrow and take 0.5 tablet of Warfarin until completed then Continue 1 tablet daily, except 0.5 tablet on Monday, Wednesday and Friday. - Recheck INR in 3 weeks. [phone]

## 2024-10-01 ENCOUNTER — Ambulatory Visit: Admitting: Student

## 2024-10-03 ENCOUNTER — Other Ambulatory Visit: Payer: Self-pay | Admitting: Cardiovascular Disease

## 2024-10-05 ENCOUNTER — Ambulatory Visit (INDEPENDENT_AMBULATORY_CARE_PROVIDER_SITE_OTHER): Admitting: Student

## 2024-10-05 ENCOUNTER — Ambulatory Visit
Admission: RE | Admit: 2024-10-05 | Discharge: 2024-10-05 | Disposition: A | Discharge status: Home / Self Care | Attending: Student | Admitting: Student

## 2024-10-05 ENCOUNTER — Ambulatory Visit: Admitting: Family Medicine

## 2024-10-05 ENCOUNTER — Ambulatory Visit
Admission: RE | Admit: 2024-10-05 | Discharge: 2024-10-05 | Disposition: A | Source: Ambulatory Visit | Attending: Student

## 2024-10-05 ENCOUNTER — Encounter: Payer: Self-pay | Admitting: Student

## 2024-10-05 VITALS — BP 104/60 | HR 79 | Temp 97.8°F | Ht 61.0 in | Wt 102.0 lb

## 2024-10-05 DIAGNOSIS — E039 Hypothyroidism, unspecified: Secondary | ICD-10-CM

## 2024-10-05 DIAGNOSIS — L03116 Cellulitis of left lower limb: Secondary | ICD-10-CM | POA: Diagnosis not present

## 2024-10-05 DIAGNOSIS — I10 Essential (primary) hypertension: Secondary | ICD-10-CM

## 2024-10-05 DIAGNOSIS — L0390 Cellulitis, unspecified: Secondary | ICD-10-CM | POA: Insufficient documentation

## 2024-10-05 MED ORDER — CEPHALEXIN 500 MG PO CAPS: 500.0000 mg | ORAL_CAPSULE | Freq: Four times a day (QID) | ORAL | 0 refills | Status: AC

## 2024-10-05 NOTE — Assessment & Plan Note (Signed)
 Likely due to ulceration of the LLE. Has had difficulty with wound healing in the past requiring wound care. Normal ABIs in 2024. Will treat with keflex , she will discuss with coumadin  clinic regarding her coumadin  dosing.

## 2024-10-05 NOTE — Progress Notes (Signed)
 "  Established Patient Office Visit  Subjective   Patient ID: [name], female    DOB: 1935/04/04  Age: 89 y.o. MRN: [id number]  Chief Complaint  Patient presents with   Hypertension   Hypothyroidism    [name] is a 89 y.o. person with medical hx listed below who presents today for follow up of LLE cellulitis, hypertension and hypothyroidism. Feels antibiotics helped with leg leg drainage somewhat but still has redness and swelling and pain.  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 09/25/2023   Status cardiac pacemaker secondary to postoperative complete heart block 09/25/2023   Physical deconditioning/frailty 09/25/2023   Macular degeneration 12/10/2022   Chronic respiratory failure with hypoxia (HCC) 04/08/2022   Gastroesophageal reflux disease 02/19/2022   Sciatica of left side 08/21/2021   Aortoiliac occlusive disease (HCC) 05/22/2021   Cochlear implant in place 05/22/2021   Acquired thrombophilia 08/11/2020   Stage 3a chronic kidney disease (CKD) (HCC) 08/11/2020   Primary insomnia 07/31/2019   Osteoporosis, postmenopausal 01/23/2018   Coronary artery disease involving native coronary artery of native heart with angina pectoris 01/13/2017   S/P placement of cardiac pacemaker 05/02/15, medtronic 05/03/2015   Bradycardia 05/03/2015   Presence of cardiac pacemaker 05/03/2015   S/P tricuspid valve repair 04/25/2015   Atrial fibrillation-persistent, with history of maze procedure 04/25/2015   CAD s/p CABGx1 04/25/2015   Pulmonary hypertension (HCC)    Chronic combined systolic and diastolic CHF (congestive heart failure) (HCC)    Chronic systolic heart failure (HCC) 11/10/2014   Essential hypertension 11/10/2014    Hyperlipidemia 11/10/2014   Prediabetes 05/08/2014   Auditory vertigo 05/08/2014   Adult hypothyroidism 05/08/2014   H/O adenomatous polyp of colon 05/08/2014   Stage 4 very severe COPD by GOLD classification (HCC) 03/28/2014      ROS Refer to HPI    Objective:     Outpatient Encounter Medications as of 10/05/2024  Medication Sig Note   acetaminophen  (TYLENOL ) 500 MG tablet Take 1,000 mg by mouth every 6 (six) hours as needed (for pain.).    amiodarone  (PACERONE ) 200 MG tablet TAKE 1 TABLET (200 MG) 5 DAYS PER WEEK    bisoprolol  (ZEBETA ) 5 MG tablet TAKE 1/2 TABLET BY MOUTH DAILY    [EXPIRED] cephALEXin  (KEFLEX ) 500 MG capsule Take 1 capsule (500 mg total) by mouth 4 (four) times daily for 5 days.    Cyanocobalamin (VITAMIN B-12 PO) Take 1,000 mcg by mouth daily.    diphenhydrAMINE  (BENADRYL ) 25 mg capsule Take 25 mg by mouth every 6 (six) hours as needed for allergies.    docusate sodium  (COLACE) 100 MG capsule Take 100 mg by mouth 2 (two) times daily as needed (constipation.).    feeding supplement (ENSURE ENLIVE / ENSURE PLUS) LIQD Take 237 mLs by mouth 2 (two) times daily between meals.    Ferrous Sulfate  (IRON PO) Take 1 tablet by mouth daily.    gabapentin  (NEURONTIN ) 100 MG capsule TAKE 2 CAPSULES BY MOUTH AT BEDTIME.    ipratropium-albuterol  (DUONEB) 0.5-2.5 (3) MG/3ML SOLN Inhale 3 mLs into the lungs every 6 (six) hours as needed.    levothyroxine  (SYNTHROID ) 88 MCG tablet TAKE 1 TABLET BY MOUTH DAILY BEFORE BREAKFAST.    OXYGEN Inhale 2 L into the  lungs continuous. 10/02/2023: Currently on 4 l/m   pantoprazole  (PROTONIX ) 40 MG tablet TAKE 1 TABLET BY MOUTH TWICE A DAY    sacubitril -valsartan  (ENTRESTO ) 24-26 MG TAKE 1 TABLET BY MOUTH TWICE A DAY    simvastatin  (ZOCOR ) 10 MG tablet TAKE 1 TABLET BY MOUTH EVERYDAY AT BEDTIME    spironolactone  (ALDACTONE ) 25 MG tablet TAKE 1 TABLET BY MOUTH EVERY DAY    torsemide  (DEMADEX ) 20 MG tablet Take 1 tablet (20 mg total) by mouth 2  (two) times daily.    warfarin (COUMADIN ) 2.5 MG tablet TAKE 1/2 TABLET TO 1 TABLET BY MOUTH DAILY AS DIRECTED BY THE ANTICOAGULATION CLINIC.    No facility-administered encounter medications on file as of 10/05/2024.    BP 104/60   Pulse 79   Temp 97.8 F (36.6 C) (Oral)   Ht 5' 1 (1.549 m)   Wt 102 lb (46.3 kg)   SpO2 94%   BMI 19.27 kg/m  BP Readings from Last 3 Encounters:  10/05/24 104/60  09/27/24 (!) 104/58  08/24/24 (!) 102/51    Physical Exam Constitutional:      Comments: Frail appearing, using walker  HENT:     Mouth/Throat:     Mouth: Mucous membranes are moist.     Pharynx: Oropharynx is clear.  Cardiovascular:     Rate and Rhythm: Normal rate.     Heart sounds: No murmur heard. Pulmonary:     Effort: Pulmonary effort is normal.     Breath sounds: No rales.     Comments: On  O2 Abdominal:     General: Abdomen is flat. Bowel sounds are normal. There is no distension.     Palpations: Abdomen is soft.     Tenderness: There is no abdominal tenderness.  Musculoskeletal:     Right lower leg: Edema present.     Left lower leg: Edema present.  Skin:    Capillary Refill: Capillary refill takes less than 2 seconds.     Comments: 2 shallow ulceration of the anterior LLE and small area of developing ulceration of later LLE, no drainage, warmth and erythema similar to prior exam  Neurological:     General: No focal deficit present.     Mental Status: She is alert and oriented to person, place, and time.  Psychiatric:        Mood and Affect: Mood normal.        Behavior: Behavior normal.        10/05/2024    3:40 PM 09/27/2024    2:03 PM 03/19/2024    8:23 AM  Depression screen PHQ 2/9  Decreased Interest 0 0 0  Down, Depressed, Hopeless 0 0 0  PHQ - 2 Score 0 0 0  Altered sleeping   0  Tired, decreased energy   1  Change in appetite   0  Feeling bad or failure about yourself    0  Trouble concentrating   0  Moving slowly or fidgety/restless   0   Suicidal thoughts   0  PHQ-9 Score   1   Difficult doing work/chores   Not difficult at all     Data saved with a previous flowsheet row definition       10/05/2024    3:40 PM 09/27/2024    2:03 PM 03/19/2024    8:23 AM 12/09/2023   10:05 AM  GAD 7 : Generalized Anxiety Score  Nervous, Anxious, on Edge 0 0  0  0   Control/stop  worrying 0 0  0  0   Worry too much - different things   0  0   Trouble relaxing   0  0   Restless   0  0   Easily annoyed or irritable   0  0   Afraid - awful might happen   0  0   Total GAD 7 Score   0 0  Anxiety Difficulty   Not difficult at all Not difficult at all     Data saved with a previous flowsheet row definition    No results found for any visits on 10/05/24.  Last CBC Lab Results  Component Value Date   WBC 7.0 06/22/2024   HGB 10.4 (L) 06/22/2024   HCT 32.9 (L) 06/22/2024   MCV 100 (H) 06/22/2024   MCH 31.6 06/22/2024   RDW 13.1 06/22/2024   PLT 155 06/22/2024   Last metabolic panel Lab Results  Component Value Date   GLUCOSE 92 06/22/2024   NA 147 (H) 06/22/2024   K 4.2 06/22/2024   CL 98 06/22/2024   CO2 31 (H) 06/22/2024   BUN 21 06/22/2024   CREATININE 0.97 06/22/2024   EGFR 57 (L) 06/22/2024   CALCIUM  9.4 06/22/2024   PHOS 2.7 02/26/2019   PROT 6.6 06/22/2024   ALBUMIN  4.2 06/22/2024   LABGLOB 2.4 06/22/2024   AGRATIO 1.6 08/21/2022   BILITOT 0.3 06/22/2024   ALKPHOS 88 06/22/2024   AST 14 06/22/2024   ALT 12 06/22/2024   ANIONGAP 10 09/29/2023   Last lipids Lab Results  Component Value Date   CHOL 164 04/09/2023   HDL 61 04/09/2023   LDLCALC 86 04/09/2023   TRIG 92 04/09/2023   CHOLHDL 2.7 04/09/2023   Last hemoglobin A1c Lab Results  Component Value Date   HGBA1C 5.4 08/21/2022   Last thyroid functions Lab Results  Component Value Date   TSH 0.748 08/19/2023   FREET4 1.94 (H) 06/22/2024      The ASCVD Risk score (Arnett DK, et al., 2019) failed to calculate for the following reasons:   The  2019 ASCVD risk score is only valid for ages 59 to 32   * - Cholesterol units were assumed    Assessment & Plan:  Cellulitis of left lower extremity Assessment & Plan: LLE ulceration similar to visit 1 week ago. Finished keflex  yesterday. No drainage. Continue to have significant dependent edema. Continue keflex . Referral to wound care. Will obtain xray of LLE. DME for compression stockings to help with dependent edema.   Orders: -     AMB referral to wound care center -     DG Tibia/Fibula Left; Future -     For home use only DME Other see comment  Essential hypertension Assessment & Plan: Currently on enstresto, spironolactone , and bisoprolol , well controlled.    Other orders -     Cephalexin ; Take 1 capsule (500 mg total) by mouth 4 (four) times daily for 5 days.  Dispense: 20 capsule; Refill: 0     No follow-ups on file.    Harlene Saddler, MD "

## 2024-10-06 ENCOUNTER — Ambulatory Visit

## 2024-10-07 ENCOUNTER — Encounter

## 2024-10-11 ENCOUNTER — Encounter: Admitting: Physician Assistant

## 2024-10-12 NOTE — Assessment & Plan Note (Signed)
 Supplemented with levothyroxine  88 mcg daily. TSH next visit.

## 2024-10-12 NOTE — Assessment & Plan Note (Signed)
 Currently on enstresto, spironolactone , and bisoprolol , well controlled.

## 2024-10-13 ENCOUNTER — Encounter: Admitting: Physician Assistant

## 2024-10-14 ENCOUNTER — Telehealth: Payer: Self-pay

## 2024-10-14 ENCOUNTER — Ambulatory Visit

## 2024-10-14 DIAGNOSIS — Z7901 Long term (current) use of anticoagulants: Secondary | ICD-10-CM

## 2024-10-14 DIAGNOSIS — I442 Atrioventricular block, complete: Secondary | ICD-10-CM

## 2024-10-14 DIAGNOSIS — I428 Other cardiomyopathies: Secondary | ICD-10-CM

## 2024-10-14 DIAGNOSIS — Z01812 Encounter for preprocedural laboratory examination: Secondary | ICD-10-CM

## 2024-10-14 DIAGNOSIS — Z950 Presence of cardiac pacemaker: Secondary | ICD-10-CM

## 2024-10-14 LAB — CUP PACEART REMOTE DEVICE CHECK
Battery Remaining Longevity: 1 mo
Battery Voltage: 2.82 V
Brady Statistic AP VP Percent: 93.68 %
Brady Statistic AP VS Percent: 0.04 %
Brady Statistic AS VP Percent: 6.28 %
Brady Statistic AS VS Percent: 0 %
Brady Statistic RA Percent Paced: 93.61 %
Brady Statistic RV Percent Paced: 99.9 %
Date Time Interrogation Session: 20260205100001
Implantable Lead Connection Status: 753985
Implantable Lead Connection Status: 753985
Implantable Lead Implant Date: 20160823
Implantable Lead Implant Date: 20160823
Implantable Lead Location: 753859
Implantable Lead Location: 753860
Implantable Lead Model: 5076
Implantable Lead Model: 5076
Implantable Pulse Generator Implant Date: 20160823
Lead Channel Impedance Value: 304 Ohm
Lead Channel Impedance Value: 342 Ohm
Lead Channel Impedance Value: 342 Ohm
Lead Channel Impedance Value: 361 Ohm
Lead Channel Pacing Threshold Amplitude: 0.5 V
Lead Channel Pacing Threshold Amplitude: 0.5 V
Lead Channel Pacing Threshold Pulse Width: 0.4 ms
Lead Channel Pacing Threshold Pulse Width: 0.4 ms
Lead Channel Sensing Intrinsic Amplitude: 0.875 mV
Lead Channel Sensing Intrinsic Amplitude: 0.875 mV
Lead Channel Sensing Intrinsic Amplitude: 8.75 mV
Lead Channel Sensing Intrinsic Amplitude: 8.75 mV
Lead Channel Setting Pacing Amplitude: 1.5 V
Lead Channel Setting Pacing Amplitude: 2 V
Lead Channel Setting Pacing Pulse Width: 0.4 ms
Lead Channel Setting Sensing Sensitivity: 2.8 mV
Zone Setting Status: 755011

## 2024-10-14 NOTE — Telephone Encounter (Signed)
 Alert received from [employer] for pacemaker @ [facility] 09/30/24.  Spoke to pts husband that patient battery has reached replacement time.  Has apt w/ Dr. Kennyth 11/16/24. Would like to see if we can get apt moved up.   Routing to EP scheduler.

## 2024-10-14 NOTE — Addendum Note (Signed)
 Addended by: CASIMIR ALDONA BRAVO on: 10/14/2024 04:23 PM   Modules accepted: Orders

## 2024-10-14 NOTE — Telephone Encounter (Signed)
 Spoke with pt and pt's husband and advised pt should plan to keep appointment with Dr Kennyth on 11/16/2024 and will schedule device generator change on 11/22/2024.  Pt and pt's husband verbalize understanding and agree with current plan.

## 2024-10-20 ENCOUNTER — Ambulatory Visit

## 2024-11-05 ENCOUNTER — Ambulatory Visit: Admitting: Student

## 2024-11-06 ENCOUNTER — Ambulatory Visit

## 2024-11-08 ENCOUNTER — Encounter

## 2024-11-16 ENCOUNTER — Ambulatory Visit: Admitting: Cardiology

## 2024-11-22 ENCOUNTER — Ambulatory Visit: Admit: 2024-11-22 | Admitting: Cardiology

## 2024-11-22 DIAGNOSIS — Z45010 Encounter for checking and testing of cardiac pacemaker pulse generator [battery]: Secondary | ICD-10-CM

## 2024-12-03 ENCOUNTER — Encounter

## 2024-12-09 ENCOUNTER — Ambulatory Visit

## 2025-03-04 ENCOUNTER — Encounter

## 2025-03-07 ENCOUNTER — Ambulatory Visit: Admitting: Medical

## 2025-06-03 ENCOUNTER — Encounter
